# Patient Record
Sex: Female | Born: 1935 | Race: White | Hispanic: No | Smoking: Never smoker
Health system: Southern US, Community
[De-identification: ages and names within clinical notes are randomized; demographics above are authoritative.]

## PROBLEM LIST (undated history)

## (undated) DIAGNOSIS — S0990XA Unspecified injury of head, initial encounter: Secondary | ICD-10-CM

## (undated) DIAGNOSIS — N390 Urinary tract infection, site not specified: Secondary | ICD-10-CM

## (undated) DIAGNOSIS — I82409 Acute embolism and thrombosis of unspecified deep veins of unspecified lower extremity: Secondary | ICD-10-CM

## (undated) DIAGNOSIS — K219 Gastro-esophageal reflux disease without esophagitis: Secondary | ICD-10-CM

## (undated) DIAGNOSIS — I951 Orthostatic hypotension: Secondary | ICD-10-CM

## (undated) DIAGNOSIS — I119 Hypertensive heart disease without heart failure: Secondary | ICD-10-CM

## (undated) DIAGNOSIS — M14679 Charcot's joint, unspecified ankle and foot: Secondary | ICD-10-CM

## (undated) DIAGNOSIS — M869 Osteomyelitis, unspecified: Secondary | ICD-10-CM

## (undated) DIAGNOSIS — I5032 Chronic diastolic (congestive) heart failure: Secondary | ICD-10-CM

## (undated) DIAGNOSIS — R7611 Nonspecific reaction to tuberculin skin test without active tuberculosis: Secondary | ICD-10-CM

## (undated) DIAGNOSIS — K589 Irritable bowel syndrome without diarrhea: Secondary | ICD-10-CM

## (undated) DIAGNOSIS — G629 Polyneuropathy, unspecified: Secondary | ICD-10-CM

## (undated) DIAGNOSIS — S91302A Unspecified open wound, left foot, initial encounter: Secondary | ICD-10-CM

## (undated) DIAGNOSIS — K449 Diaphragmatic hernia without obstruction or gangrene: Secondary | ICD-10-CM

## (undated) DIAGNOSIS — Z9889 Other specified postprocedural states: Secondary | ICD-10-CM

## (undated) DIAGNOSIS — R0600 Dyspnea, unspecified: Secondary | ICD-10-CM

## (undated) DIAGNOSIS — I2510 Atherosclerotic heart disease of native coronary artery without angina pectoris: Secondary | ICD-10-CM

## (undated) DIAGNOSIS — I872 Venous insufficiency (chronic) (peripheral): Secondary | ICD-10-CM

## (undated) DIAGNOSIS — K5790 Diverticulosis of intestine, part unspecified, without perforation or abscess without bleeding: Secondary | ICD-10-CM

## (undated) DIAGNOSIS — R911 Solitary pulmonary nodule: Secondary | ICD-10-CM

## (undated) DIAGNOSIS — D126 Benign neoplasm of colon, unspecified: Secondary | ICD-10-CM

## (undated) DIAGNOSIS — J189 Pneumonia, unspecified organism: Secondary | ICD-10-CM

## (undated) DIAGNOSIS — R112 Nausea with vomiting, unspecified: Secondary | ICD-10-CM

## (undated) DIAGNOSIS — H903 Sensorineural hearing loss, bilateral: Secondary | ICD-10-CM

## (undated) DIAGNOSIS — M1990 Unspecified osteoarthritis, unspecified site: Secondary | ICD-10-CM

## (undated) DIAGNOSIS — M4800 Spinal stenosis, site unspecified: Secondary | ICD-10-CM

## (undated) DIAGNOSIS — F0390 Unspecified dementia without behavioral disturbance: Secondary | ICD-10-CM

## (undated) DIAGNOSIS — D099 Carcinoma in situ, unspecified: Secondary | ICD-10-CM

## (undated) DIAGNOSIS — H3530 Unspecified macular degeneration: Secondary | ICD-10-CM

## (undated) DIAGNOSIS — I2699 Other pulmonary embolism without acute cor pulmonale: Secondary | ICD-10-CM

## (undated) DIAGNOSIS — R931 Abnormal findings on diagnostic imaging of heart and coronary circulation: Secondary | ICD-10-CM

## (undated) DIAGNOSIS — G9050 Complex regional pain syndrome I, unspecified: Secondary | ICD-10-CM

## (undated) DIAGNOSIS — K760 Fatty (change of) liver, not elsewhere classified: Secondary | ICD-10-CM

## (undated) DIAGNOSIS — E785 Hyperlipidemia, unspecified: Secondary | ICD-10-CM

## (undated) DIAGNOSIS — E119 Type 2 diabetes mellitus without complications: Secondary | ICD-10-CM

## (undated) DIAGNOSIS — C801 Malignant (primary) neoplasm, unspecified: Secondary | ICD-10-CM

## (undated) DIAGNOSIS — I219 Acute myocardial infarction, unspecified: Secondary | ICD-10-CM

## (undated) DIAGNOSIS — R51 Headache: Secondary | ICD-10-CM

## (undated) HISTORY — DX: Chronic diastolic (congestive) heart failure: I50.32

## (undated) HISTORY — DX: Dyspnea, unspecified: R06.00

## (undated) HISTORY — DX: Sensorineural hearing loss, bilateral: H90.3

## (undated) HISTORY — DX: Unspecified open wound, left foot, initial encounter: S91.302A

## (undated) HISTORY — DX: Complex regional pain syndrome I, unspecified: G90.50

## (undated) HISTORY — DX: Nonspecific reaction to tuberculin skin test without active tuberculosis: R76.11

## (undated) HISTORY — DX: Charcot's joint, unspecified ankle and foot: M14.679

## (undated) HISTORY — DX: Other pulmonary embolism without acute cor pulmonale: I26.99

## (undated) HISTORY — DX: Diverticulosis of intestine, part unspecified, without perforation or abscess without bleeding: K57.90

## (undated) HISTORY — DX: Pneumonia, unspecified organism: J18.9

## (undated) HISTORY — DX: Irritable bowel syndrome, unspecified: K58.9

## (undated) HISTORY — DX: Fatty (change of) liver, not elsewhere classified: K76.0

## (undated) HISTORY — DX: Diaphragmatic hernia without obstruction or gangrene: K44.9

## (undated) HISTORY — DX: Hypertensive heart disease without heart failure: I11.9

## (undated) HISTORY — DX: Spinal stenosis, site unspecified: M48.00

## (undated) HISTORY — DX: Hyperlipidemia, unspecified: E78.5

## (undated) HISTORY — DX: Benign neoplasm of colon, unspecified: D12.6

## (undated) HISTORY — DX: Acute embolism and thrombosis of unspecified deep veins of unspecified lower extremity: I82.409

## (undated) HISTORY — DX: Unspecified osteoarthritis, unspecified site: M19.90

## (undated) HISTORY — DX: Solitary pulmonary nodule: R91.1

## (undated) HISTORY — DX: Unspecified macular degeneration: H35.30

---

## 1963-09-23 HISTORY — PX: NASAL SEPTUM SURGERY: SHX37

## 1966-03-23 HISTORY — PX: VEIN LIGATION: SHX2652

## 1975-03-24 HISTORY — PX: VAGINAL HYSTERECTOMY: SUR661

## 1992-10-23 DIAGNOSIS — D099 Carcinoma in situ, unspecified: Secondary | ICD-10-CM

## 1992-10-23 HISTORY — DX: Carcinoma in situ, unspecified: D09.9

## 1999-08-29 ENCOUNTER — Other Ambulatory Visit: Admission: RE | Admit: 1999-08-29 | Discharge: 1999-08-29 | Payer: Self-pay | Admitting: *Deleted

## 2001-01-11 ENCOUNTER — Other Ambulatory Visit: Admission: RE | Admit: 2001-01-11 | Discharge: 2001-01-11 | Payer: Self-pay | Admitting: Internal Medicine

## 2001-01-11 ENCOUNTER — Encounter (INDEPENDENT_AMBULATORY_CARE_PROVIDER_SITE_OTHER): Payer: Self-pay | Admitting: Specialist

## 2001-01-15 ENCOUNTER — Ambulatory Visit (HOSPITAL_COMMUNITY): Admission: RE | Admit: 2001-01-15 | Discharge: 2001-01-15 | Payer: Self-pay | Admitting: Internal Medicine

## 2001-01-15 ENCOUNTER — Encounter: Payer: Self-pay | Admitting: Internal Medicine

## 2002-03-03 ENCOUNTER — Other Ambulatory Visit: Admission: RE | Admit: 2002-03-03 | Discharge: 2002-03-03 | Payer: Self-pay | Admitting: *Deleted

## 2002-03-23 HISTORY — PX: CHOLECYSTECTOMY: SHX55

## 2002-03-28 ENCOUNTER — Encounter: Admission: RE | Admit: 2002-03-28 | Discharge: 2002-03-28 | Payer: Self-pay | Admitting: *Deleted

## 2002-04-10 ENCOUNTER — Encounter (INDEPENDENT_AMBULATORY_CARE_PROVIDER_SITE_OTHER): Payer: Self-pay | Admitting: Specialist

## 2002-04-10 ENCOUNTER — Encounter (INDEPENDENT_AMBULATORY_CARE_PROVIDER_SITE_OTHER): Payer: Self-pay | Admitting: *Deleted

## 2002-04-11 ENCOUNTER — Encounter: Payer: Self-pay | Admitting: Emergency Medicine

## 2002-04-11 ENCOUNTER — Inpatient Hospital Stay (HOSPITAL_COMMUNITY): Admission: EM | Admit: 2002-04-11 | Discharge: 2002-04-18 | Payer: Self-pay | Admitting: Emergency Medicine

## 2002-04-15 ENCOUNTER — Encounter: Payer: Self-pay | Admitting: Gastroenterology

## 2002-04-16 ENCOUNTER — Encounter: Payer: Self-pay | Admitting: General Surgery

## 2002-04-17 ENCOUNTER — Encounter: Payer: Self-pay | Admitting: Gastroenterology

## 2002-09-15 ENCOUNTER — Encounter: Admission: RE | Admit: 2002-09-15 | Discharge: 2002-09-15 | Payer: Self-pay | Admitting: *Deleted

## 2002-10-23 HISTORY — PX: CATARACT EXTRACTION W/ INTRAOCULAR LENS  IMPLANT, BILATERAL: SHX1307

## 2003-06-15 ENCOUNTER — Ambulatory Visit (HOSPITAL_COMMUNITY): Admission: RE | Admit: 2003-06-15 | Discharge: 2003-06-15 | Payer: Self-pay | Admitting: Specialist

## 2003-06-16 ENCOUNTER — Emergency Department (HOSPITAL_COMMUNITY): Admission: EM | Admit: 2003-06-16 | Discharge: 2003-06-17 | Payer: Self-pay | Admitting: Emergency Medicine

## 2003-06-17 ENCOUNTER — Encounter: Payer: Self-pay | Admitting: Emergency Medicine

## 2003-06-24 HISTORY — PX: CYST REMOVAL HAND: SHX6279

## 2003-09-15 ENCOUNTER — Encounter: Admission: RE | Admit: 2003-09-15 | Discharge: 2003-09-15 | Payer: Self-pay

## 2003-11-08 ENCOUNTER — Emergency Department (HOSPITAL_COMMUNITY): Admission: AD | Admit: 2003-11-08 | Discharge: 2003-11-08 | Payer: Self-pay | Admitting: Internal Medicine

## 2004-03-30 ENCOUNTER — Ambulatory Visit (HOSPITAL_COMMUNITY): Admission: RE | Admit: 2004-03-30 | Discharge: 2004-03-30 | Payer: Self-pay | Admitting: Internal Medicine

## 2004-05-11 ENCOUNTER — Encounter: Admission: RE | Admit: 2004-05-11 | Discharge: 2004-05-11 | Payer: Self-pay | Admitting: Otolaryngology

## 2004-08-29 ENCOUNTER — Ambulatory Visit: Payer: Self-pay | Admitting: Internal Medicine

## 2004-09-05 ENCOUNTER — Ambulatory Visit: Payer: Self-pay | Admitting: Internal Medicine

## 2004-11-23 HISTORY — PX: ROTATOR CUFF REPAIR: SHX139

## 2004-12-01 ENCOUNTER — Ambulatory Visit (HOSPITAL_BASED_OUTPATIENT_CLINIC_OR_DEPARTMENT_OTHER): Admission: RE | Admit: 2004-12-01 | Discharge: 2004-12-01 | Payer: Self-pay | Admitting: Orthopedic Surgery

## 2004-12-01 ENCOUNTER — Ambulatory Visit (HOSPITAL_COMMUNITY): Admission: RE | Admit: 2004-12-01 | Discharge: 2004-12-01 | Payer: Self-pay | Admitting: Orthopedic Surgery

## 2005-02-13 ENCOUNTER — Ambulatory Visit: Payer: Self-pay | Admitting: Internal Medicine

## 2005-02-24 ENCOUNTER — Ambulatory Visit: Payer: Self-pay | Admitting: Internal Medicine

## 2005-03-06 ENCOUNTER — Ambulatory Visit: Payer: Self-pay | Admitting: Internal Medicine

## 2005-06-06 ENCOUNTER — Encounter: Payer: Self-pay | Admitting: Internal Medicine

## 2005-08-07 ENCOUNTER — Ambulatory Visit: Payer: Self-pay | Admitting: Internal Medicine

## 2005-08-29 ENCOUNTER — Ambulatory Visit: Payer: Self-pay | Admitting: Internal Medicine

## 2006-01-12 ENCOUNTER — Ambulatory Visit: Payer: Self-pay | Admitting: Internal Medicine

## 2006-02-01 ENCOUNTER — Ambulatory Visit: Payer: Self-pay | Admitting: Cardiology

## 2006-02-01 ENCOUNTER — Encounter: Payer: Self-pay | Admitting: Internal Medicine

## 2006-02-12 ENCOUNTER — Ambulatory Visit: Payer: Self-pay | Admitting: Internal Medicine

## 2006-02-21 ENCOUNTER — Ambulatory Visit: Payer: Self-pay

## 2006-02-21 ENCOUNTER — Encounter: Payer: Self-pay | Admitting: Internal Medicine

## 2006-02-26 ENCOUNTER — Encounter (INDEPENDENT_AMBULATORY_CARE_PROVIDER_SITE_OTHER): Payer: Self-pay | Admitting: Specialist

## 2006-02-26 ENCOUNTER — Ambulatory Visit: Payer: Self-pay | Admitting: Internal Medicine

## 2006-02-26 ENCOUNTER — Encounter: Payer: Self-pay | Admitting: Internal Medicine

## 2006-03-08 ENCOUNTER — Encounter: Payer: Self-pay | Admitting: Internal Medicine

## 2006-03-08 ENCOUNTER — Encounter (INDEPENDENT_AMBULATORY_CARE_PROVIDER_SITE_OTHER): Payer: Self-pay | Admitting: Specialist

## 2006-03-08 ENCOUNTER — Ambulatory Visit: Payer: Self-pay | Admitting: Internal Medicine

## 2006-03-09 ENCOUNTER — Ambulatory Visit: Payer: Self-pay | Admitting: Cardiology

## 2006-03-20 ENCOUNTER — Encounter: Payer: Self-pay | Admitting: Internal Medicine

## 2006-03-20 ENCOUNTER — Ambulatory Visit: Payer: Self-pay | Admitting: Cardiology

## 2006-07-11 ENCOUNTER — Ambulatory Visit: Payer: Self-pay | Admitting: Internal Medicine

## 2007-04-05 ENCOUNTER — Encounter (INDEPENDENT_AMBULATORY_CARE_PROVIDER_SITE_OTHER): Payer: Self-pay | Admitting: *Deleted

## 2007-04-15 DIAGNOSIS — K219 Gastro-esophageal reflux disease without esophagitis: Secondary | ICD-10-CM | POA: Insufficient documentation

## 2007-04-15 DIAGNOSIS — I10 Essential (primary) hypertension: Secondary | ICD-10-CM | POA: Insufficient documentation

## 2007-05-09 ENCOUNTER — Ambulatory Visit: Payer: Self-pay | Admitting: Internal Medicine

## 2007-05-09 DIAGNOSIS — G9050 Complex regional pain syndrome I, unspecified: Secondary | ICD-10-CM | POA: Insufficient documentation

## 2007-05-09 DIAGNOSIS — E1142 Type 2 diabetes mellitus with diabetic polyneuropathy: Secondary | ICD-10-CM | POA: Insufficient documentation

## 2007-05-13 ENCOUNTER — Encounter (INDEPENDENT_AMBULATORY_CARE_PROVIDER_SITE_OTHER): Payer: Self-pay | Admitting: *Deleted

## 2007-05-13 LAB — CONVERTED CEMR LAB
ALT: 25 units/L (ref 0–35)
AST: 27 units/L (ref 0–37)
BUN: 21 mg/dL (ref 6–23)
CO2: 27 meq/L (ref 19–32)
Calcium: 9.3 mg/dL (ref 8.4–10.5)
Chloride: 103 meq/L (ref 96–112)
Cholesterol: 216 mg/dL (ref 0–200)
Creatinine, Ser: 1 mg/dL (ref 0.4–1.2)
Creatinine,U: 89.1 mg/dL
Direct LDL: 124.3 mg/dL
GFR calc Af Amer: 71 mL/min
GFR calc non Af Amer: 58 mL/min
Glucose, Bld: 149 mg/dL — ABNORMAL HIGH (ref 70–99)
HDL: 52 mg/dL (ref >39.0)
Hemoglobin: 14.3 g/dL (ref 12.0–15.0)
Hgb A1c MFr Bld: 6.9 % — ABNORMAL HIGH (ref 4.6–6.0)
Microalb Creat Ratio: 2.2 mg/g (ref 0.0–30.0)
Microalb, Ur: 0.2 mg/dL (ref 0.0–1.9)
Potassium: 4.4 meq/L (ref 3.5–5.1)
Sodium: 139 meq/L (ref 135–145)
TSH: 2.63 microintl units/mL (ref 0.35–5.50)
Total CHOL/HDL Ratio: 4.2
Triglycerides: 284 mg/dL (ref 0–149)
VLDL: 57 mg/dL — ABNORMAL HIGH (ref 0–40)

## 2007-05-17 ENCOUNTER — Encounter (INDEPENDENT_AMBULATORY_CARE_PROVIDER_SITE_OTHER): Payer: Self-pay | Admitting: *Deleted

## 2007-05-31 ENCOUNTER — Encounter: Payer: Self-pay | Admitting: Internal Medicine

## 2007-06-07 ENCOUNTER — Ambulatory Visit: Payer: Self-pay | Admitting: Internal Medicine

## 2007-06-13 ENCOUNTER — Ambulatory Visit: Payer: Self-pay | Admitting: Internal Medicine

## 2007-06-13 ENCOUNTER — Encounter: Payer: Self-pay | Admitting: Internal Medicine

## 2007-06-16 ENCOUNTER — Emergency Department (HOSPITAL_COMMUNITY): Admission: EM | Admit: 2007-06-16 | Discharge: 2007-06-16 | Payer: Self-pay | Admitting: Emergency Medicine

## 2007-06-17 ENCOUNTER — Telehealth (INDEPENDENT_AMBULATORY_CARE_PROVIDER_SITE_OTHER): Payer: Self-pay | Admitting: *Deleted

## 2007-06-24 HISTORY — PX: WISDOM TOOTH EXTRACTION: SHX21

## 2007-06-26 ENCOUNTER — Ambulatory Visit: Payer: Self-pay | Admitting: Cardiology

## 2007-06-27 ENCOUNTER — Telehealth (INDEPENDENT_AMBULATORY_CARE_PROVIDER_SITE_OTHER): Payer: Self-pay | Admitting: *Deleted

## 2007-07-01 ENCOUNTER — Ambulatory Visit: Payer: Self-pay

## 2007-07-24 HISTORY — PX: ROTATOR CUFF REPAIR: SHX139

## 2007-07-30 ENCOUNTER — Encounter: Payer: Self-pay | Admitting: Internal Medicine

## 2007-07-30 ENCOUNTER — Encounter: Admission: RE | Admit: 2007-07-30 | Discharge: 2007-07-30 | Payer: Self-pay | Admitting: Orthopedic Surgery

## 2007-07-30 ENCOUNTER — Telehealth (INDEPENDENT_AMBULATORY_CARE_PROVIDER_SITE_OTHER): Payer: Self-pay | Admitting: *Deleted

## 2007-07-31 ENCOUNTER — Ambulatory Visit (HOSPITAL_BASED_OUTPATIENT_CLINIC_OR_DEPARTMENT_OTHER): Admission: RE | Admit: 2007-07-31 | Discharge: 2007-08-01 | Payer: Self-pay | Admitting: Orthopedic Surgery

## 2007-08-09 ENCOUNTER — Telehealth (INDEPENDENT_AMBULATORY_CARE_PROVIDER_SITE_OTHER): Payer: Self-pay | Admitting: *Deleted

## 2007-08-15 ENCOUNTER — Encounter: Payer: Self-pay | Admitting: Internal Medicine

## 2007-10-24 DIAGNOSIS — Z9889 Other specified postprocedural states: Secondary | ICD-10-CM

## 2007-10-24 DIAGNOSIS — R112 Nausea with vomiting, unspecified: Secondary | ICD-10-CM

## 2007-10-24 HISTORY — DX: Other specified postprocedural states: Z98.890

## 2007-10-24 HISTORY — DX: Other specified postprocedural states: R11.2

## 2007-10-25 ENCOUNTER — Telehealth (INDEPENDENT_AMBULATORY_CARE_PROVIDER_SITE_OTHER): Payer: Self-pay | Admitting: *Deleted

## 2007-11-01 ENCOUNTER — Encounter: Admission: RE | Admit: 2007-11-01 | Discharge: 2007-11-01 | Payer: Self-pay | Admitting: Internal Medicine

## 2007-12-18 ENCOUNTER — Inpatient Hospital Stay (HOSPITAL_COMMUNITY): Admission: AD | Admit: 2007-12-18 | Discharge: 2007-12-20 | Payer: Self-pay | Admitting: Orthopaedic Surgery

## 2007-12-18 HISTORY — PX: ANTERIOR CERVICAL DECOMP/DISCECTOMY FUSION: SHX1161

## 2007-12-22 ENCOUNTER — Encounter: Payer: Self-pay | Admitting: Internal Medicine

## 2007-12-22 ENCOUNTER — Ambulatory Visit: Payer: Self-pay | Admitting: Vascular Surgery

## 2007-12-22 ENCOUNTER — Ambulatory Visit: Payer: Self-pay | Admitting: Cardiology

## 2007-12-22 ENCOUNTER — Inpatient Hospital Stay (HOSPITAL_COMMUNITY): Admission: EM | Admit: 2007-12-22 | Discharge: 2007-12-26 | Payer: Self-pay | Admitting: Emergency Medicine

## 2007-12-22 DIAGNOSIS — I2699 Other pulmonary embolism without acute cor pulmonale: Secondary | ICD-10-CM | POA: Insufficient documentation

## 2007-12-22 HISTORY — DX: Other pulmonary embolism without acute cor pulmonale: I26.99

## 2007-12-24 ENCOUNTER — Encounter: Payer: Self-pay | Admitting: Internal Medicine

## 2007-12-25 ENCOUNTER — Ambulatory Visit: Payer: Self-pay | Admitting: Internal Medicine

## 2007-12-30 ENCOUNTER — Ambulatory Visit: Payer: Self-pay | Admitting: Internal Medicine

## 2007-12-30 DIAGNOSIS — I2699 Other pulmonary embolism without acute cor pulmonale: Secondary | ICD-10-CM | POA: Insufficient documentation

## 2007-12-30 DIAGNOSIS — E785 Hyperlipidemia, unspecified: Secondary | ICD-10-CM | POA: Insufficient documentation

## 2007-12-30 DIAGNOSIS — M810 Age-related osteoporosis without current pathological fracture: Secondary | ICD-10-CM | POA: Insufficient documentation

## 2007-12-30 LAB — CONVERTED CEMR LAB
Blood Glucose, Fasting: 152 mg/dL
INR: 2.4

## 2008-01-01 ENCOUNTER — Encounter: Payer: Self-pay | Admitting: Internal Medicine

## 2008-01-02 ENCOUNTER — Ambulatory Visit: Payer: Self-pay | Admitting: Internal Medicine

## 2008-01-02 LAB — CONVERTED CEMR LAB: INR: 2.2

## 2008-01-06 ENCOUNTER — Encounter (INDEPENDENT_AMBULATORY_CARE_PROVIDER_SITE_OTHER): Payer: Self-pay | Admitting: *Deleted

## 2008-01-10 ENCOUNTER — Ambulatory Visit: Payer: Self-pay | Admitting: Internal Medicine

## 2008-01-10 LAB — CONVERTED CEMR LAB: INR: 1.7

## 2008-01-13 ENCOUNTER — Ambulatory Visit: Payer: Self-pay | Admitting: Pulmonary Disease

## 2008-01-20 ENCOUNTER — Telehealth (INDEPENDENT_AMBULATORY_CARE_PROVIDER_SITE_OTHER): Payer: Self-pay | Admitting: *Deleted

## 2008-01-20 ENCOUNTER — Ambulatory Visit: Payer: Self-pay | Admitting: Cardiology

## 2008-01-20 ENCOUNTER — Ambulatory Visit: Payer: Self-pay | Admitting: Internal Medicine

## 2008-01-21 ENCOUNTER — Telehealth (INDEPENDENT_AMBULATORY_CARE_PROVIDER_SITE_OTHER): Payer: Self-pay | Admitting: *Deleted

## 2008-01-22 DIAGNOSIS — R911 Solitary pulmonary nodule: Secondary | ICD-10-CM | POA: Insufficient documentation

## 2008-01-24 ENCOUNTER — Ambulatory Visit (HOSPITAL_COMMUNITY): Admission: RE | Admit: 2008-01-24 | Discharge: 2008-01-24 | Payer: Self-pay | Admitting: Pulmonary Disease

## 2008-01-27 DIAGNOSIS — D126 Benign neoplasm of colon, unspecified: Secondary | ICD-10-CM | POA: Insufficient documentation

## 2008-01-27 DIAGNOSIS — K5730 Diverticulosis of large intestine without perforation or abscess without bleeding: Secondary | ICD-10-CM | POA: Insufficient documentation

## 2008-01-27 DIAGNOSIS — Z8719 Personal history of other diseases of the digestive system: Secondary | ICD-10-CM | POA: Insufficient documentation

## 2008-02-03 ENCOUNTER — Ambulatory Visit: Payer: Self-pay | Admitting: Internal Medicine

## 2008-02-03 LAB — CONVERTED CEMR LAB: INR: 2.5

## 2008-02-05 ENCOUNTER — Telehealth: Payer: Self-pay | Admitting: Pulmonary Disease

## 2008-02-12 ENCOUNTER — Telehealth (INDEPENDENT_AMBULATORY_CARE_PROVIDER_SITE_OTHER): Payer: Self-pay | Admitting: *Deleted

## 2008-03-03 ENCOUNTER — Ambulatory Visit: Payer: Self-pay | Admitting: Internal Medicine

## 2008-03-03 LAB — CONVERTED CEMR LAB
INR: 1.9
Prothrombin Time: 16.8 s

## 2008-03-24 ENCOUNTER — Ambulatory Visit: Payer: Self-pay | Admitting: Internal Medicine

## 2008-03-24 LAB — CONVERTED CEMR LAB
INR: 1.5
Prothrombin Time: 14.9 s

## 2008-04-06 ENCOUNTER — Ambulatory Visit: Payer: Self-pay | Admitting: Internal Medicine

## 2008-04-06 LAB — CONVERTED CEMR LAB
INR: 2.1
Prothrombin Time: 17.8 s

## 2008-04-27 ENCOUNTER — Ambulatory Visit: Payer: Self-pay | Admitting: Internal Medicine

## 2008-04-27 DIAGNOSIS — R609 Edema, unspecified: Secondary | ICD-10-CM | POA: Insufficient documentation

## 2008-04-27 LAB — CONVERTED CEMR LAB
INR: 2
Prothrombin Time: 17.5 s

## 2008-05-07 ENCOUNTER — Encounter: Admission: RE | Admit: 2008-05-07 | Discharge: 2008-05-07 | Payer: Self-pay | Admitting: Orthopaedic Surgery

## 2008-05-07 ENCOUNTER — Encounter: Payer: Self-pay | Admitting: Internal Medicine

## 2008-05-19 ENCOUNTER — Encounter: Payer: Self-pay | Admitting: Internal Medicine

## 2008-05-25 ENCOUNTER — Ambulatory Visit: Payer: Self-pay | Admitting: Internal Medicine

## 2008-05-25 LAB — CONVERTED CEMR LAB
INR: 2.2
Prothrombin Time: 18.1 s

## 2008-06-02 ENCOUNTER — Encounter: Payer: Self-pay | Admitting: Internal Medicine

## 2008-06-03 ENCOUNTER — Ambulatory Visit: Payer: Self-pay | Admitting: Internal Medicine

## 2008-06-03 DIAGNOSIS — M1990 Unspecified osteoarthritis, unspecified site: Secondary | ICD-10-CM | POA: Insufficient documentation

## 2008-06-08 LAB — CONVERTED CEMR LAB
ALT: 16 units/L (ref 0–35)
AST: 19 units/L (ref 0–37)
BUN: 23 mg/dL (ref 6–23)
CO2: 27 meq/L (ref 19–32)
Calcium: 9.3 mg/dL (ref 8.4–10.5)
Chloride: 103 meq/L (ref 96–112)
Cholesterol: 224 mg/dL (ref 0–200)
Creatinine, Ser: 1 mg/dL (ref 0.4–1.2)
Direct LDL: 118.1 mg/dL
GFR calc Af Amer: 70 mL/min
GFR calc non Af Amer: 58 mL/min
Glucose, Bld: 124 mg/dL — ABNORMAL HIGH (ref 70–99)
HDL: 50.5 mg/dL (ref >39.0)
Hemoglobin: 13.3 g/dL (ref 12.0–15.0)
Potassium: 4.4 meq/L (ref 3.5–5.1)
Sodium: 140 meq/L (ref 135–145)
Total CHOL/HDL Ratio: 4.4
Triglycerides: 266 mg/dL (ref 0–149)
VLDL: 53 mg/dL — ABNORMAL HIGH (ref 0–40)

## 2008-06-23 ENCOUNTER — Encounter: Payer: Self-pay | Admitting: Internal Medicine

## 2008-06-25 ENCOUNTER — Ambulatory Visit: Payer: Self-pay | Admitting: Internal Medicine

## 2008-06-25 LAB — CONVERTED CEMR LAB
INR: 3.7
Prothrombin Time: 23.2 s

## 2008-07-07 ENCOUNTER — Ambulatory Visit: Payer: Self-pay | Admitting: Internal Medicine

## 2008-07-07 LAB — CONVERTED CEMR LAB
Bilirubin Urine: NEGATIVE
Glucose, Urine, Semiquant: NEGATIVE
INR: 2.6
Ketones, urine, test strip: NEGATIVE
Nitrite: NEGATIVE
Protein, U semiquant: NEGATIVE
Prothrombin Time: 19.5 s
Specific Gravity, Urine: 1.01
Urobilinogen, UA: 0.2
pH: 7

## 2008-07-08 ENCOUNTER — Encounter: Payer: Self-pay | Admitting: Internal Medicine

## 2008-07-08 LAB — CONVERTED CEMR LAB

## 2008-07-09 ENCOUNTER — Encounter: Payer: Self-pay | Admitting: Internal Medicine

## 2008-07-14 ENCOUNTER — Observation Stay (HOSPITAL_COMMUNITY): Admission: AD | Admit: 2008-07-14 | Discharge: 2008-07-16 | Payer: Self-pay | Admitting: Internal Medicine

## 2008-07-14 ENCOUNTER — Ambulatory Visit: Payer: Self-pay | Admitting: Cardiology

## 2008-07-14 ENCOUNTER — Ambulatory Visit: Payer: Self-pay | Admitting: Internal Medicine

## 2008-07-15 ENCOUNTER — Encounter: Payer: Self-pay | Admitting: Internal Medicine

## 2008-07-21 ENCOUNTER — Ambulatory Visit: Payer: Self-pay | Admitting: Internal Medicine

## 2008-07-21 LAB — CONVERTED CEMR LAB
BUN: 22 mg/dL (ref 6–23)
Creatinine, Ser: 1 mg/dL (ref 0.4–1.2)

## 2008-07-22 ENCOUNTER — Telehealth: Payer: Self-pay | Admitting: Internal Medicine

## 2008-07-24 ENCOUNTER — Ambulatory Visit: Payer: Self-pay | Admitting: Cardiology

## 2008-07-27 ENCOUNTER — Ambulatory Visit: Payer: Self-pay | Admitting: Internal Medicine

## 2008-07-27 LAB — CONVERTED CEMR LAB
Bilirubin Urine: NEGATIVE
Blood in Urine, dipstick: NEGATIVE
Glucose, Urine, Semiquant: NEGATIVE
INR: 1.5
Ketones, urine, test strip: NEGATIVE
Nitrite: NEGATIVE
Protein, U semiquant: NEGATIVE
Prothrombin Time: 15.2 s
Specific Gravity, Urine: 1.01
Urobilinogen, UA: 0.2
WBC Urine, dipstick: NEGATIVE
pH: 7

## 2008-07-28 ENCOUNTER — Telehealth: Payer: Self-pay | Admitting: Internal Medicine

## 2008-08-03 LAB — CONVERTED CEMR LAB: Hgb A1c MFr Bld: 6.3 % — ABNORMAL HIGH (ref 4.6–6.0)

## 2008-08-04 ENCOUNTER — Telehealth: Payer: Self-pay | Admitting: Internal Medicine

## 2008-08-04 ENCOUNTER — Encounter: Payer: Self-pay | Admitting: Internal Medicine

## 2008-08-04 LAB — CONVERTED CEMR LAB: INR: 1.9

## 2008-08-05 ENCOUNTER — Encounter (INDEPENDENT_AMBULATORY_CARE_PROVIDER_SITE_OTHER): Payer: Self-pay | Admitting: Orthopedic Surgery

## 2008-08-05 ENCOUNTER — Inpatient Hospital Stay (HOSPITAL_COMMUNITY): Admission: AD | Admit: 2008-08-05 | Discharge: 2008-08-06 | Payer: Self-pay | Admitting: Orthopedic Surgery

## 2008-08-05 ENCOUNTER — Encounter: Payer: Self-pay | Admitting: Internal Medicine

## 2008-08-12 ENCOUNTER — Telehealth: Payer: Self-pay | Admitting: Internal Medicine

## 2008-08-18 ENCOUNTER — Encounter: Payer: Self-pay | Admitting: Internal Medicine

## 2008-08-20 ENCOUNTER — Ambulatory Visit: Payer: Self-pay | Admitting: Internal Medicine

## 2008-08-20 ENCOUNTER — Ambulatory Visit: Payer: Self-pay | Admitting: Cardiovascular Disease

## 2008-08-20 LAB — CONVERTED CEMR LAB
Blood Glucose, Fingerstick: 166
Hemoglobin: 14.6 g/dL
INR: 2.3
Prothrombin Time: 18.4 s

## 2008-08-21 ENCOUNTER — Telehealth (INDEPENDENT_AMBULATORY_CARE_PROVIDER_SITE_OTHER): Payer: Self-pay | Admitting: *Deleted

## 2008-08-23 HISTORY — PX: TOE AMPUTATION: SHX809

## 2008-08-25 ENCOUNTER — Telehealth: Payer: Self-pay | Admitting: Internal Medicine

## 2008-08-31 ENCOUNTER — Ambulatory Visit: Payer: Self-pay | Admitting: Internal Medicine

## 2008-09-02 ENCOUNTER — Encounter (INDEPENDENT_AMBULATORY_CARE_PROVIDER_SITE_OTHER): Payer: Self-pay | Admitting: *Deleted

## 2008-09-09 ENCOUNTER — Ambulatory Visit: Payer: Self-pay | Admitting: Internal Medicine

## 2008-09-14 ENCOUNTER — Telehealth: Payer: Self-pay | Admitting: Internal Medicine

## 2008-09-16 ENCOUNTER — Encounter (INDEPENDENT_AMBULATORY_CARE_PROVIDER_SITE_OTHER): Payer: Self-pay | Admitting: *Deleted

## 2008-09-18 ENCOUNTER — Ambulatory Visit: Payer: Self-pay | Admitting: Internal Medicine

## 2008-09-18 LAB — CONVERTED CEMR LAB
INR: 4.5
Prothrombin Time: 25.8 s

## 2008-09-21 ENCOUNTER — Telehealth: Payer: Self-pay | Admitting: Internal Medicine

## 2008-09-29 ENCOUNTER — Ambulatory Visit: Payer: Self-pay | Admitting: Internal Medicine

## 2008-10-02 ENCOUNTER — Ambulatory Visit: Payer: Self-pay | Admitting: Family Medicine

## 2008-10-02 LAB — CONVERTED CEMR LAB
INR: 1.9
Prothrombin Time: 17.1 s

## 2008-10-06 ENCOUNTER — Telehealth (INDEPENDENT_AMBULATORY_CARE_PROVIDER_SITE_OTHER): Payer: Self-pay | Admitting: *Deleted

## 2008-10-13 ENCOUNTER — Ambulatory Visit: Payer: Self-pay | Admitting: Internal Medicine

## 2008-10-13 LAB — CONVERTED CEMR LAB
INR: 1.8
Prothrombin Time: 16.7 s

## 2008-10-15 ENCOUNTER — Telehealth (INDEPENDENT_AMBULATORY_CARE_PROVIDER_SITE_OTHER): Payer: Self-pay | Admitting: *Deleted

## 2008-10-27 ENCOUNTER — Ambulatory Visit: Payer: Self-pay | Admitting: Family Medicine

## 2008-10-27 LAB — CONVERTED CEMR LAB
INR: 1.7
Prothrombin Time: 16.2 s

## 2008-10-29 ENCOUNTER — Ambulatory Visit: Payer: Self-pay | Admitting: Internal Medicine

## 2008-11-03 ENCOUNTER — Ambulatory Visit: Payer: Self-pay | Admitting: Internal Medicine

## 2008-11-03 LAB — CONVERTED CEMR LAB
INR: 1.6
Prothrombin Time: 15.6 s

## 2008-11-11 ENCOUNTER — Telehealth (INDEPENDENT_AMBULATORY_CARE_PROVIDER_SITE_OTHER): Payer: Self-pay | Admitting: *Deleted

## 2008-12-07 ENCOUNTER — Telehealth (INDEPENDENT_AMBULATORY_CARE_PROVIDER_SITE_OTHER): Payer: Self-pay | Admitting: *Deleted

## 2008-12-14 ENCOUNTER — Telehealth (INDEPENDENT_AMBULATORY_CARE_PROVIDER_SITE_OTHER): Payer: Self-pay | Admitting: *Deleted

## 2009-01-20 ENCOUNTER — Ambulatory Visit: Payer: Self-pay | Admitting: Internal Medicine

## 2009-01-25 LAB — CONVERTED CEMR LAB
ALT: 17 units/L (ref 0–35)
AST: 23 units/L (ref 0–37)
BUN: 19 mg/dL (ref 6–23)
CO2: 27 meq/L (ref 19–32)
Calcium: 9.3 mg/dL (ref 8.4–10.5)
Chloride: 105 meq/L (ref 96–112)
Creatinine, Ser: 1 mg/dL (ref 0.4–1.2)
GFR calc non Af Amer: 57.99 mL/min (ref >60)
Glucose, Bld: 110 mg/dL — ABNORMAL HIGH (ref 70–99)
Hgb A1c MFr Bld: 6.4 % (ref 4.6–6.5)
Potassium: 4.2 meq/L (ref 3.5–5.1)
Sodium: 141 meq/L (ref 135–145)

## 2009-03-18 ENCOUNTER — Ambulatory Visit: Payer: Self-pay | Admitting: Pulmonary Disease

## 2009-03-18 DIAGNOSIS — R0602 Shortness of breath: Secondary | ICD-10-CM | POA: Insufficient documentation

## 2009-03-23 DIAGNOSIS — H3530 Unspecified macular degeneration: Secondary | ICD-10-CM

## 2009-03-23 HISTORY — DX: Unspecified macular degeneration: H35.30

## 2009-03-24 ENCOUNTER — Telehealth (INDEPENDENT_AMBULATORY_CARE_PROVIDER_SITE_OTHER): Payer: Self-pay | Admitting: *Deleted

## 2009-03-25 ENCOUNTER — Ambulatory Visit: Payer: Self-pay | Admitting: Internal Medicine

## 2009-03-25 LAB — CONVERTED CEMR LAB: Hemoglobin: 14.3 g/dL

## 2009-03-29 ENCOUNTER — Telehealth (INDEPENDENT_AMBULATORY_CARE_PROVIDER_SITE_OTHER): Payer: Self-pay | Admitting: *Deleted

## 2009-03-30 ENCOUNTER — Encounter (INDEPENDENT_AMBULATORY_CARE_PROVIDER_SITE_OTHER): Payer: Self-pay | Admitting: *Deleted

## 2009-03-30 ENCOUNTER — Ambulatory Visit: Payer: Self-pay

## 2009-03-30 ENCOUNTER — Encounter: Payer: Self-pay | Admitting: Internal Medicine

## 2009-03-30 LAB — CONVERTED CEMR LAB
BUN: 16 mg/dL (ref 6–23)
Basophils Absolute: 0 10*3/uL (ref 0.0–0.1)
Basophils Relative: 0.2 % (ref 0.0–3.0)
Creatinine, Ser: 0.9 mg/dL (ref 0.4–1.2)
Eosinophils Absolute: 0.3 10*3/uL (ref 0.0–0.7)
Eosinophils Relative: 5.4 % — ABNORMAL HIGH (ref 0.0–5.0)
HCT: 40.2 % (ref 36.0–46.0)
Hemoglobin: 13.8 g/dL (ref 12.0–15.0)
Lymphocytes Relative: 42 % (ref 12.0–46.0)
Lymphs Abs: 2.6 10*3/uL (ref 0.7–4.0)
MCHC: 34.4 g/dL (ref 30.0–36.0)
MCV: 88.2 fL (ref 78.0–100.0)
Monocytes Absolute: 0.5 10*3/uL (ref 0.1–1.0)
Monocytes Relative: 7.5 % (ref 3.0–12.0)
Neutro Abs: 2.7 10*3/uL (ref 1.4–7.7)
Neutrophils Relative %: 44.9 % (ref 43.0–77.0)
Platelets: 179 10*3/uL (ref 150.0–400.0)
Potassium: 4.7 meq/L (ref 3.5–5.1)
RBC: 4.56 M/uL (ref 3.87–5.11)
RDW: 14.6 % (ref 11.5–14.6)
TSH: 1.94 microintl units/mL (ref 0.35–5.50)
WBC: 6.1 10*3/uL (ref 4.5–10.5)

## 2009-04-02 ENCOUNTER — Telehealth: Payer: Self-pay | Admitting: Internal Medicine

## 2009-04-07 ENCOUNTER — Telehealth (INDEPENDENT_AMBULATORY_CARE_PROVIDER_SITE_OTHER): Payer: Self-pay | Admitting: *Deleted

## 2009-04-22 ENCOUNTER — Ambulatory Visit: Payer: Self-pay | Admitting: Internal Medicine

## 2009-04-22 ENCOUNTER — Encounter: Admission: RE | Admit: 2009-04-22 | Discharge: 2009-04-22 | Payer: Self-pay | Admitting: Internal Medicine

## 2009-04-22 LAB — CONVERTED CEMR LAB
Creatinine,U: 86.1 mg/dL
Microalb Creat Ratio: 16.3 mg/g (ref 0.0–30.0)
Microalb, Ur: 1.4 mg/dL (ref 0.0–1.9)

## 2009-04-27 LAB — CONVERTED CEMR LAB
Cholesterol: 204 mg/dL — ABNORMAL HIGH (ref 0–200)
Direct LDL: 123.8 mg/dL
HDL: 47.2 mg/dL (ref >39.00)
Hgb A1c MFr Bld: 6.2 % (ref 4.6–6.5)
Total CHOL/HDL Ratio: 4
Triglycerides: 221 mg/dL — ABNORMAL HIGH (ref 0.0–149.0)
VLDL: 44.2 mg/dL — ABNORMAL HIGH (ref 0.0–40.0)

## 2009-04-29 ENCOUNTER — Encounter: Payer: Self-pay | Admitting: Internal Medicine

## 2009-04-30 ENCOUNTER — Telehealth: Payer: Self-pay | Admitting: Internal Medicine

## 2009-05-14 ENCOUNTER — Telehealth (INDEPENDENT_AMBULATORY_CARE_PROVIDER_SITE_OTHER): Payer: Self-pay | Admitting: *Deleted

## 2009-05-18 ENCOUNTER — Ambulatory Visit: Payer: Self-pay | Admitting: Internal Medicine

## 2009-05-19 ENCOUNTER — Encounter: Payer: Self-pay | Admitting: Internal Medicine

## 2009-05-27 ENCOUNTER — Encounter: Payer: Self-pay | Admitting: Internal Medicine

## 2009-05-27 ENCOUNTER — Ambulatory Visit: Payer: Self-pay | Admitting: Internal Medicine

## 2009-05-27 LAB — HM COLONOSCOPY: HM Colonoscopy: NORMAL

## 2009-05-28 ENCOUNTER — Encounter: Payer: Self-pay | Admitting: Internal Medicine

## 2009-06-14 LAB — HM COLONOSCOPY

## 2009-06-14 LAB — HM DIABETES FOOT EXAM

## 2009-06-23 HISTORY — PX: FOOT BONE EXCISION: SUR493

## 2009-07-06 ENCOUNTER — Telehealth: Payer: Self-pay | Admitting: Internal Medicine

## 2009-07-26 ENCOUNTER — Ambulatory Visit: Payer: Self-pay | Admitting: Internal Medicine

## 2009-07-28 ENCOUNTER — Telehealth (INDEPENDENT_AMBULATORY_CARE_PROVIDER_SITE_OTHER): Payer: Self-pay | Admitting: *Deleted

## 2009-08-04 ENCOUNTER — Encounter: Payer: Self-pay | Admitting: Internal Medicine

## 2009-08-10 ENCOUNTER — Encounter: Admission: RE | Admit: 2009-08-10 | Discharge: 2009-09-20 | Payer: Self-pay | Admitting: Internal Medicine

## 2009-08-13 ENCOUNTER — Encounter: Payer: Self-pay | Admitting: Internal Medicine

## 2009-08-26 ENCOUNTER — Telehealth: Payer: Self-pay | Admitting: Internal Medicine

## 2009-08-27 ENCOUNTER — Ambulatory Visit: Payer: Self-pay | Admitting: Cardiovascular Disease

## 2009-08-30 ENCOUNTER — Ambulatory Visit: Payer: Self-pay | Admitting: Internal Medicine

## 2009-09-09 ENCOUNTER — Telehealth (INDEPENDENT_AMBULATORY_CARE_PROVIDER_SITE_OTHER): Payer: Self-pay | Admitting: *Deleted

## 2009-09-10 ENCOUNTER — Encounter: Payer: Self-pay | Admitting: Internal Medicine

## 2009-09-10 ENCOUNTER — Ambulatory Visit: Payer: Self-pay | Admitting: Family

## 2009-09-10 DIAGNOSIS — K140 Glossitis: Secondary | ICD-10-CM | POA: Insufficient documentation

## 2009-09-10 LAB — CONVERTED CEMR LAB
Folate: >20 ng/mL
Pro B Natriuretic peptide (BNP): 35 pg/mL (ref 0.0–100.0)
Vitamin B-12: >1500 pg/mL — ABNORMAL HIGH (ref 211–911)

## 2009-09-11 ENCOUNTER — Encounter: Payer: Self-pay | Admitting: Family

## 2009-09-11 LAB — CONVERTED CEMR LAB

## 2009-09-14 ENCOUNTER — Telehealth (INDEPENDENT_AMBULATORY_CARE_PROVIDER_SITE_OTHER): Payer: Self-pay | Admitting: *Deleted

## 2009-09-15 ENCOUNTER — Ambulatory Visit: Payer: Self-pay | Admitting: Internal Medicine

## 2009-09-16 ENCOUNTER — Encounter: Payer: Self-pay | Admitting: Internal Medicine

## 2009-09-16 LAB — CONVERTED CEMR LAB: RBC / HPF: NONE SEEN (ref <3)

## 2009-09-20 ENCOUNTER — Encounter: Payer: Self-pay | Admitting: Internal Medicine

## 2009-09-23 ENCOUNTER — Ambulatory Visit: Payer: Self-pay | Admitting: Pulmonary Disease

## 2009-09-23 ENCOUNTER — Ambulatory Visit: Payer: Self-pay | Admitting: Internal Medicine

## 2009-09-24 ENCOUNTER — Telehealth (INDEPENDENT_AMBULATORY_CARE_PROVIDER_SITE_OTHER): Payer: Self-pay | Admitting: *Deleted

## 2009-09-27 LAB — CONVERTED CEMR LAB
ALT: 20 units/L (ref 0–35)
AST: 21 units/L (ref 0–37)
BUN: 16 mg/dL (ref 6–23)
Basophils Absolute: 0 10*3/uL (ref 0.0–0.1)
Basophils Relative: 0.6 % (ref 0.0–3.0)
CO2: 27 meq/L (ref 19–32)
Calcium: 9.7 mg/dL (ref 8.4–10.5)
Chloride: 105 meq/L (ref 96–112)
Creatinine, Ser: 0.8 mg/dL (ref 0.4–1.2)
Eosinophils Absolute: 0.1 10*3/uL (ref 0.0–0.7)
Eosinophils Relative: 1.8 % (ref 0.0–5.0)
GFR calc non Af Amer: 74.88 mL/min (ref >60)
Glucose, Bld: 129 mg/dL — ABNORMAL HIGH (ref 70–99)
HCT: 41.6 % (ref 36.0–46.0)
Hemoglobin: 13.9 g/dL (ref 12.0–15.0)
Lymphocytes Relative: 38.9 % (ref 12.0–46.0)
Lymphs Abs: 2.5 10*3/uL (ref 0.7–4.0)
MCHC: 33.4 g/dL (ref 30.0–36.0)
MCV: 92.8 fL (ref 78.0–100.0)
Monocytes Absolute: 0.3 10*3/uL (ref 0.1–1.0)
Monocytes Relative: 5 % (ref 3.0–12.0)
Neutro Abs: 3.5 10*3/uL (ref 1.4–7.7)
Neutrophils Relative %: 53.7 % (ref 43.0–77.0)
Platelets: 196 10*3/uL (ref 150.0–400.0)
Potassium: 4.4 meq/L (ref 3.5–5.1)
RBC: 4.49 M/uL (ref 3.87–5.11)
RDW: 13.9 % (ref 11.5–14.6)
Sodium: 141 meq/L (ref 135–145)
WBC: 6.4 10*3/uL (ref 4.5–10.5)

## 2009-10-01 ENCOUNTER — Telehealth: Payer: Self-pay | Admitting: Pulmonary Disease

## 2009-10-01 ENCOUNTER — Ambulatory Visit (HOSPITAL_COMMUNITY): Admission: RE | Admit: 2009-10-01 | Discharge: 2009-10-01 | Payer: Self-pay | Admitting: Pulmonary Disease

## 2009-10-11 ENCOUNTER — Ambulatory Visit: Payer: Self-pay

## 2009-10-11 ENCOUNTER — Encounter: Payer: Self-pay | Admitting: Pulmonary Disease

## 2009-10-11 ENCOUNTER — Ambulatory Visit: Payer: Self-pay | Admitting: Internal Medicine

## 2009-10-11 ENCOUNTER — Ambulatory Visit (HOSPITAL_COMMUNITY): Admission: RE | Admit: 2009-10-11 | Discharge: 2009-10-11 | Payer: Self-pay | Admitting: Pulmonary Disease

## 2009-10-12 ENCOUNTER — Telehealth (INDEPENDENT_AMBULATORY_CARE_PROVIDER_SITE_OTHER): Payer: Self-pay | Admitting: *Deleted

## 2009-10-18 ENCOUNTER — Ambulatory Visit: Payer: Self-pay | Admitting: Internal Medicine

## 2009-10-18 LAB — CONVERTED CEMR LAB
BUN: 22 mg/dL (ref 6–23)
CO2: 27 meq/L (ref 19–32)
Calcium: 9.3 mg/dL (ref 8.4–10.5)
Chloride: 105 meq/L (ref 96–112)
Creatinine, Ser: 0.8 mg/dL (ref 0.4–1.2)
GFR calc non Af Amer: 74.87 mL/min (ref >60)
Glucose, Bld: 121 mg/dL — ABNORMAL HIGH (ref 70–99)
Potassium: 4.4 meq/L (ref 3.5–5.1)
Sodium: 140 meq/L (ref 135–145)

## 2009-10-27 ENCOUNTER — Telehealth: Payer: Self-pay | Admitting: Internal Medicine

## 2009-11-05 ENCOUNTER — Telehealth: Payer: Self-pay | Admitting: Internal Medicine

## 2009-11-12 ENCOUNTER — Telehealth (INDEPENDENT_AMBULATORY_CARE_PROVIDER_SITE_OTHER): Payer: Self-pay | Admitting: *Deleted

## 2009-11-16 ENCOUNTER — Encounter: Payer: Self-pay | Admitting: Internal Medicine

## 2009-11-17 ENCOUNTER — Telehealth (INDEPENDENT_AMBULATORY_CARE_PROVIDER_SITE_OTHER): Payer: Self-pay | Admitting: *Deleted

## 2009-11-19 ENCOUNTER — Ambulatory Visit: Payer: Self-pay | Admitting: Internal Medicine

## 2009-11-19 ENCOUNTER — Telehealth: Payer: Self-pay | Admitting: Internal Medicine

## 2009-11-19 DIAGNOSIS — R110 Nausea: Secondary | ICD-10-CM | POA: Insufficient documentation

## 2009-11-22 ENCOUNTER — Telehealth: Payer: Self-pay | Admitting: Internal Medicine

## 2009-11-22 LAB — CONVERTED CEMR LAB
ALT: 28 units/L (ref 0–35)
AST: 20 units/L (ref 0–37)
Hemoglobin: 13.9 g/dL (ref 12.0–15.0)
Hgb A1c MFr Bld: 6.4 % (ref 4.6–6.5)

## 2009-11-24 ENCOUNTER — Encounter: Payer: Self-pay | Admitting: Internal Medicine

## 2009-12-06 ENCOUNTER — Ambulatory Visit: Payer: Self-pay | Admitting: Internal Medicine

## 2009-12-06 DIAGNOSIS — Z8711 Personal history of peptic ulcer disease: Secondary | ICD-10-CM | POA: Insufficient documentation

## 2009-12-06 DIAGNOSIS — K449 Diaphragmatic hernia without obstruction or gangrene: Secondary | ICD-10-CM | POA: Insufficient documentation

## 2009-12-07 ENCOUNTER — Emergency Department (HOSPITAL_COMMUNITY): Admission: EM | Admit: 2009-12-07 | Discharge: 2009-12-08 | Payer: Self-pay | Admitting: Emergency Medicine

## 2009-12-08 ENCOUNTER — Telehealth: Payer: Self-pay | Admitting: Internal Medicine

## 2009-12-09 ENCOUNTER — Ambulatory Visit: Payer: Self-pay | Admitting: Pulmonary Disease

## 2009-12-10 ENCOUNTER — Ambulatory Visit (HOSPITAL_COMMUNITY): Admission: RE | Admit: 2009-12-10 | Discharge: 2009-12-10 | Payer: Self-pay | Admitting: Internal Medicine

## 2009-12-13 ENCOUNTER — Encounter (INDEPENDENT_AMBULATORY_CARE_PROVIDER_SITE_OTHER): Payer: Self-pay

## 2009-12-13 ENCOUNTER — Ambulatory Visit: Payer: Self-pay | Admitting: Internal Medicine

## 2009-12-14 ENCOUNTER — Ambulatory Visit: Payer: Self-pay | Admitting: Internal Medicine

## 2009-12-17 ENCOUNTER — Ambulatory Visit: Payer: Self-pay | Admitting: Internal Medicine

## 2009-12-17 ENCOUNTER — Telehealth: Payer: Self-pay | Admitting: Internal Medicine

## 2009-12-20 ENCOUNTER — Telehealth: Payer: Self-pay | Admitting: Internal Medicine

## 2009-12-30 ENCOUNTER — Encounter: Payer: Self-pay | Admitting: Internal Medicine

## 2009-12-30 ENCOUNTER — Telehealth (INDEPENDENT_AMBULATORY_CARE_PROVIDER_SITE_OTHER): Payer: Self-pay | Admitting: *Deleted

## 2010-01-05 ENCOUNTER — Ambulatory Visit (HOSPITAL_COMMUNITY): Admission: RE | Admit: 2010-01-05 | Discharge: 2010-01-05 | Payer: Self-pay | Admitting: Surgery

## 2010-01-21 HISTORY — PX: VENA CAVA FILTER PLACEMENT: SUR1032

## 2010-01-25 ENCOUNTER — Inpatient Hospital Stay (HOSPITAL_COMMUNITY): Admission: RE | Admit: 2010-01-25 | Discharge: 2010-01-28 | Payer: Self-pay | Admitting: Surgery

## 2010-01-25 HISTORY — PX: NISSEN FUNDOPLICATION: SHX2091

## 2010-01-28 ENCOUNTER — Telehealth (INDEPENDENT_AMBULATORY_CARE_PROVIDER_SITE_OTHER): Payer: Self-pay | Admitting: *Deleted

## 2010-02-16 ENCOUNTER — Ambulatory Visit: Payer: Self-pay | Admitting: Internal Medicine

## 2010-02-17 ENCOUNTER — Encounter: Payer: Self-pay | Admitting: Internal Medicine

## 2010-02-17 ENCOUNTER — Telehealth (INDEPENDENT_AMBULATORY_CARE_PROVIDER_SITE_OTHER): Payer: Self-pay | Admitting: *Deleted

## 2010-03-11 ENCOUNTER — Ambulatory Visit: Payer: Self-pay | Admitting: Internal Medicine

## 2010-03-14 LAB — CONVERTED CEMR LAB: Hgb A1c MFr Bld: 6.1 % — ABNORMAL HIGH (ref <5.7)

## 2010-03-23 ENCOUNTER — Encounter: Payer: Self-pay | Admitting: Internal Medicine

## 2010-04-04 ENCOUNTER — Encounter: Admission: RE | Admit: 2010-04-04 | Discharge: 2010-04-04 | Payer: Self-pay | Admitting: Surgery

## 2010-04-12 ENCOUNTER — Telehealth (INDEPENDENT_AMBULATORY_CARE_PROVIDER_SITE_OTHER): Payer: Self-pay | Admitting: *Deleted

## 2010-04-13 ENCOUNTER — Ambulatory Visit: Payer: Self-pay | Admitting: Internal Medicine

## 2010-04-13 DIAGNOSIS — R42 Dizziness and giddiness: Secondary | ICD-10-CM | POA: Insufficient documentation

## 2010-04-14 ENCOUNTER — Encounter: Payer: Self-pay | Admitting: Internal Medicine

## 2010-04-14 ENCOUNTER — Telehealth: Payer: Self-pay | Admitting: Internal Medicine

## 2010-04-14 ENCOUNTER — Ambulatory Visit: Payer: Self-pay | Admitting: Internal Medicine

## 2010-04-14 DIAGNOSIS — E875 Hyperkalemia: Secondary | ICD-10-CM | POA: Insufficient documentation

## 2010-04-14 LAB — CONVERTED CEMR LAB
BUN: 19 mg/dL (ref 6–23)
BUN: 17 mg/dL (ref 6–23)
CO2: 28 meq/L (ref 19–32)
CO2: 28 meq/L (ref 19–32)
Calcium: 9.8 mg/dL (ref 8.4–10.5)
Calcium: 9.2 mg/dL (ref 8.4–10.5)
Chloride: 107 meq/L (ref 96–112)
Chloride: 105 meq/L (ref 96–112)
Creatinine, Ser: 0.8 mg/dL (ref 0.4–1.2)
Creatinine, Ser: 0.79 mg/dL (ref 0.40–1.20)
GFR calc non Af Amer: 70.67 mL/min (ref >60)
Glucose, Bld: 154 mg/dL — ABNORMAL HIGH (ref 70–99)
Glucose, Bld: 157 mg/dL — ABNORMAL HIGH (ref 70–99)
Potassium: 6.4 meq/L (ref 3.5–5.1)
Potassium: 4.2 meq/L (ref 3.5–5.3)
Sodium: 143 meq/L (ref 135–145)
Sodium: 140 meq/L (ref 135–145)

## 2010-04-15 ENCOUNTER — Ambulatory Visit: Payer: Self-pay | Admitting: Internal Medicine

## 2010-04-15 DIAGNOSIS — J309 Allergic rhinitis, unspecified: Secondary | ICD-10-CM | POA: Insufficient documentation

## 2010-04-19 ENCOUNTER — Telehealth (INDEPENDENT_AMBULATORY_CARE_PROVIDER_SITE_OTHER): Payer: Self-pay | Admitting: *Deleted

## 2010-04-22 ENCOUNTER — Encounter: Payer: Self-pay | Admitting: Internal Medicine

## 2010-05-20 ENCOUNTER — Ambulatory Visit: Payer: Self-pay | Admitting: Internal Medicine

## 2010-05-20 LAB — CONVERTED CEMR LAB
BUN: 13 mg/dL (ref 6–23)
Basophils Absolute: 0 10*3/uL (ref 0.0–0.1)
Basophils Relative: 0.4 % (ref 0.0–3.0)
CO2: 27 meq/L (ref 19–32)
Calcium: 8.9 mg/dL (ref 8.4–10.5)
Chloride: 101 meq/L (ref 96–112)
Creatinine, Ser: 0.7 mg/dL (ref 0.4–1.2)
Eosinophils Absolute: 0.1 10*3/uL (ref 0.0–0.7)
Eosinophils Relative: 1.6 % (ref 0.0–5.0)
GFR calc non Af Amer: 87.2 mL/min (ref >60)
Glucose, Bld: 172 mg/dL — ABNORMAL HIGH (ref 70–99)
HCT: 39.7 % (ref 36.0–46.0)
Hemoglobin: 13.4 g/dL (ref 12.0–15.0)
INR: 1 (ref 0.8–1.0)
Lymphocytes Relative: 32.8 % (ref 12.0–46.0)
Lymphs Abs: 2.3 10*3/uL (ref 0.7–4.0)
MCHC: 33.7 g/dL (ref 30.0–36.0)
MCV: 90.9 fL (ref 78.0–100.0)
Monocytes Absolute: 0.4 10*3/uL (ref 0.1–1.0)
Monocytes Relative: 6.2 % (ref 3.0–12.0)
Neutro Abs: 4.2 10*3/uL (ref 1.4–7.7)
Neutrophils Relative %: 59 % (ref 43.0–77.0)
Platelets: 184 10*3/uL (ref 150.0–400.0)
Potassium: 3.7 meq/L (ref 3.5–5.1)
Prothrombin Time: 11.1 s (ref 9.7–11.8)
RBC: 4.37 M/uL (ref 3.87–5.11)
RDW: 14 % (ref 11.5–14.6)
Sodium: 135 meq/L (ref 135–145)
WBC: 7.1 10*3/uL (ref 4.5–10.5)
aPTT: 28.5 s (ref 21.7–28.8)

## 2010-05-23 HISTORY — PX: CARDIAC CATHETERIZATION: SHX172

## 2010-05-27 ENCOUNTER — Ambulatory Visit (HOSPITAL_COMMUNITY): Admission: RE | Admit: 2010-05-27 | Discharge: 2010-05-27 | Payer: Self-pay | Admitting: Cardiovascular Disease

## 2010-05-27 ENCOUNTER — Ambulatory Visit: Payer: Self-pay | Admitting: Cardiovascular Disease

## 2010-05-27 ENCOUNTER — Encounter: Payer: Self-pay | Admitting: Internal Medicine

## 2010-05-27 ENCOUNTER — Inpatient Hospital Stay (HOSPITAL_BASED_OUTPATIENT_CLINIC_OR_DEPARTMENT_OTHER): Admission: RE | Admit: 2010-05-27 | Discharge: 2010-05-27 | Payer: Self-pay | Admitting: Internal Medicine

## 2010-06-13 ENCOUNTER — Ambulatory Visit: Payer: Self-pay | Admitting: Internal Medicine

## 2010-06-17 ENCOUNTER — Encounter (INDEPENDENT_AMBULATORY_CARE_PROVIDER_SITE_OTHER): Payer: Self-pay | Admitting: Emergency Medicine

## 2010-06-17 ENCOUNTER — Emergency Department (HOSPITAL_COMMUNITY): Admission: EM | Admit: 2010-06-17 | Discharge: 2010-06-17 | Payer: Self-pay | Admitting: Emergency Medicine

## 2010-06-17 ENCOUNTER — Ambulatory Visit: Payer: Self-pay | Admitting: Vascular Surgery

## 2010-06-17 ENCOUNTER — Telehealth: Payer: Self-pay | Admitting: Internal Medicine

## 2010-06-22 ENCOUNTER — Ambulatory Visit (HOSPITAL_COMMUNITY): Admission: RE | Admit: 2010-06-22 | Discharge: 2010-06-22 | Payer: Self-pay | Admitting: Internal Medicine

## 2010-06-22 ENCOUNTER — Ambulatory Visit: Payer: Self-pay | Admitting: Internal Medicine

## 2010-06-22 ENCOUNTER — Encounter: Payer: Self-pay | Admitting: Internal Medicine

## 2010-06-30 ENCOUNTER — Ambulatory Visit: Payer: Self-pay | Admitting: Internal Medicine

## 2010-07-01 ENCOUNTER — Telehealth: Payer: Self-pay | Admitting: Internal Medicine

## 2010-07-05 ENCOUNTER — Ambulatory Visit (HOSPITAL_COMMUNITY): Admission: RE | Admit: 2010-07-05 | Discharge: 2010-07-05 | Payer: Self-pay | Admitting: Internal Medicine

## 2010-07-05 ENCOUNTER — Encounter: Payer: Self-pay | Admitting: Internal Medicine

## 2010-07-08 ENCOUNTER — Ambulatory Visit: Payer: Self-pay | Admitting: Internal Medicine

## 2010-07-08 ENCOUNTER — Telehealth: Payer: Self-pay | Admitting: Internal Medicine

## 2010-07-26 ENCOUNTER — Encounter: Payer: Self-pay | Admitting: Internal Medicine

## 2010-08-26 ENCOUNTER — Encounter: Payer: Self-pay | Admitting: Internal Medicine

## 2010-08-31 ENCOUNTER — Ambulatory Visit: Payer: Self-pay | Admitting: Internal Medicine

## 2010-09-01 ENCOUNTER — Encounter: Admission: RE | Admit: 2010-09-01 | Discharge: 2010-09-01 | Payer: Self-pay | Admitting: Neurology

## 2010-09-07 ENCOUNTER — Encounter (HOSPITAL_COMMUNITY)
Admission: RE | Admit: 2010-09-07 | Discharge: 2010-11-22 | Payer: Self-pay | Source: Home / Self Care | Attending: Internal Medicine | Admitting: Internal Medicine

## 2010-09-28 ENCOUNTER — Ambulatory Visit: Payer: Self-pay | Admitting: Internal Medicine

## 2010-10-03 LAB — CONVERTED CEMR LAB
Creatinine,U: 139.5 mg/dL
Hgb A1c MFr Bld: 6.3 % (ref 4.6–6.5)
Microalb Creat Ratio: 1.4 mg/g (ref 0.0–30.0)
Microalb, Ur: 1.9 mg/dL (ref 0.0–1.9)

## 2010-10-28 ENCOUNTER — Encounter: Payer: Self-pay | Admitting: Internal Medicine

## 2010-11-20 LAB — CONVERTED CEMR LAB
INR: 2.4
INR: 2.4
Prothrombin Time: 18.8 s

## 2010-11-22 NOTE — Progress Notes (Signed)
Summary: Surgical Consult Appt.   Phone Note From Other Clinic   Summary of Call: Pt. is in [facility] recovery, per Dr.Carmichael Burdette, she needs a surgical consult for [facility] repair. She will see Dr.Mat Daphine Deutscher at [facility] on 12-30-09 arriving at 11:20am for 11:40am appt. All records have been faxed. Appt. information given to Quincy Carnes RN in [facility] recovery. I will also mail pt. an appt. reminder phamplet from [facility]. Pt. instructed to call back as needed.  Initial call taken by: Laureen Ochs LPN,  December 17, 2009 3:43 PM

## 2010-11-22 NOTE — Progress Notes (Signed)
Summary: referral to Dr. Sandria Manly   Phone Note Call from Patient Call back at Home Phone [phone]   Caller: Patient Reason for Call: Talk to Nurse, Referral Details for Reason: Dr. Sandria Manly office  Initial call taken by: Lorne Skeens,  July 01, 2010 10:34 AM  Follow-up for Phone Call        pt tried to sch appt w/Dr Love but was told it had been 3 years since she was there and she needed a new pt referral, Jesusita Oka is that ok to refer? Meredith Staggers, RN  July 01, 2010 5:17 PM   Additional Follow-up for Phone Call Additional follow up Details #1::        yes. please. did they ever fax over the list of blood tests to order for her to rule out neuromuscular disaeses? Dolores Patty, MD, [facility]  July 04, 2010 11:11 PM  received labs will call pt to sch, she is having pfts and fluro today, order placed for neuro referral Meredith Staggers, RN  July 05, 2010 9:49 AM      Additional Follow-up for Phone Call Additional follow up Details #2::    pt was given xray results, she will come for labwork on Fri 9/16, order in [facility], RN  July 07, 2010 1:23 PM

## 2010-11-22 NOTE — Progress Notes (Signed)
Summary: rx magic mouthwash  Phone Note Call from Patient Call back at Home Phone [phone]   Caller: Patient Summary of Call: pt called requesting rx for Magic mouth wash, has used before, removed from med list 11/19/09. -symptoms has come back, dry mouth, scaly under lip --pt has appt 01/25/10 Use [facility] [address] Initial call taken by: Kandice Hams,  December 30, 2009 4:51 PM  Follow-up for Phone Call        please RF  RX faxed left msg for pt .Kandice Hams  December 31, 2009 11:04 AM  Follow-up by: Nolon Rod. Paz MD,  December 31, 2009 10:17 AM    New/Updated Medications: * MAGIC MOUTHWASH swish and spit three times a day for a week Prescriptions: MAGIC MOUTHWASH swish and spit three times a day for a week  #5cc x 0   Entered by:   Kandice Hams   Authorized by:   Nolon Rod. Paz MD   Signed by:   Kandice Hams on 12/31/2009   Method used:   Faxed to ...       [facility]. [phone]* (retail)       [address]       [place], [place]  [address]       Ph: [phone]       Fax: [phone]   RxID:   [id number]

## 2010-11-22 NOTE — Assessment & Plan Note (Signed)
Summary: 1 month rov.sl  Medications Added TRIAMCINOLONE ACETONIDE 0.1 % LOTN (TRIAMCINOLONE ACETONIDE) UAD- as needed      Allergies Added:   Referring Provider:  n/a Primary Provider:  [name], MD  CC:  1 month rov.  Pt reports that she is still having difficulty breathing.  [name]  History of Present Illness: The patient presents today for routine cardiology followup.  She reports having some improvement in her orthostasis but continues to have profound SOB upon standing.  Though her SOB is not entirely exertional, it appears worse clearly when standing.  She also notes that her lips are frequently blue.  She also reports ongoing episodes of chest tightness with exertion. The patient denies symptoms of palpitations, chest pain,  orthopnea, PND, lower extremity edema,presyncope, syncope, or neurologic sequela. The patient is tolerating medications without difficulties and is otherwise without complaint today.    Current Medications (verified): 1)  Amlodipine Besy-Benazepril Hcl 5-10 Mg Caps (Amlodipine Besy-Benazepril Hcl) .... One By Mouth Daily 2)  Meclizine Hcl 25 Mg Tabs (Meclizine Hcl) .[name].. 1 By Mouth As Needed Dizzy 3)  Low-Dose Aspirin 81 Mg  Tabs (Aspirin) .... Take 1 Tablet By Mouth Once A Day 4)  Glucometer and Suplies .... Check Once A Day 5)  Freestyle Lite   Strp (Glucose Blood) .... Check Bs Two Times A Day Dx 250.00 6)  Freestyle Lancets   Misc (Lancets) .... Use Two Times A Day Dx 250.00 7)  Magnesium Powder .[name].. 3 Tsp Once Daily 8)  Trivita Nerve Formula .... Take By Mouth Daily 9)  Eye Vitamin 10)  Lutein 11)  Voltaren Gel .... Apply As Needed To Ankle Per Patient 12)  Vitamin D 2000 Unit Tabs (Cholecalciferol) .... 3 Capsules Daily 13)  Calcium 14)  Boron 15)  Vicodin Hp 10-660 Mg Tabs (Hydrocodone-Acetaminophen) .... One By Mouth Every 4 Hours As Needed For Pain 16)  Fludrocortisone Acetate 0.1 Mg Tabs (Fludrocortisone Acetate) .... One By Mouth Daily 17)  Magic  Mouth Wash .... 5 Cc Every 6 Hours As Needed 18)  Veramyst 27.5 Mcg/spray Susp (Fluticasone Furoate) .... 2 Sprays On Each Side of The Nose Once Daily 19)  Triamcinolone Acetonide 0.1 % Lotn (Triamcinolone Acetonide) .... Uad- As Needed  Allergies (verified): 1)  ! Talwin 2)  ! Methadone Hcl 3)  ! Tranxene-T 4)  ! * ? Antibiotics 5)  ! * Cefazolin 6)  ! Doxycycline 7)  ! * Enablex 8)  ! * Dilaudid 9)  ! Adhesive Tape  Past History:  Past Surgical History: Last updated: 04/15/2010 Cholecystectomy Hysterectomy w/o bilateral salpingo-oophorectomy hx of cataract sx  rotator cuff surgery 10-08 Dr Eulah Pont Cspine surgery for OA --fussion, Dr Ophelia Charter (12-18-07) (f/u by a PE) 3th R toe amputation d/t osteomyelitis 11-09 Dr Lajoyce Corners  hiatal hernia surgery, [facility] Funda[facility]---(01/25/10)  Family History: Last updated: 09/29/2008 Family Hsitory Headaches Family History Hypertension heart disease: mother (mitral valve replaced) rheumatism: mother No FH of Colon Cancer:  Social History: Last updated: 07/17/2008 Divorced daughter lives with her pt has children. pt is retired from [employer], site.  Patient has never smoked.  Alcohol Use - no Illicit Drug Use - no  Past Medical History: PE and DVT 12-2007, after neck surgery, CT chest 10-09 neg for PE , coumadin d/c 10-2008 CP 06-2008: admited, r/o for MI, ECHO normal; symptoms likely GI stress test abnormal 2007, but  normal 03-2009 HYPERTENSION  HYPERLIPIDEMIA Large hiatal hernia repair 01/25/10 DIABETES MELLITUS ?asthma (SOB when attempted  to  d/c singulair 11-2008) Pulmonary Nodule , incidental per CT: PET scan 4-9: likely  benign, CT 08-2009 no change, no further CTs( Dr Shelle Iron) REFLEX SYMPATHETIC DYSTROPHY  and Neuropathy Osteoporosis Hemorrhoids h/o IBS h/o colon polyps and DIVERTICULOSIS h/ o +PPD HX of Malignant Colon Polyp in 1994, colonoscopy again 8-10, had a biopsy. Next 2015 Dx w/  early macular degeneration June 2010  (Dr Earlene Plater) Orthostatic intolerance Allergic rhinitis  Family History: Reviewed history from 09/29/2008 and no changes required. Family Hsitory Headaches Family History Hypertension heart disease: mother (mitral valve replaced) rheumatism: mother No FH of Colon Cancer:  Social History: Reviewed history from 07/17/2008 and no changes required. Divorced daughter lives with her pt has children. pt is retired from [employer], site.  Patient has never smoked.  Alcohol Use - no Illicit Drug Use - no  Review of Systems       All systems are reviewed and negative except as listed in the HPI.   Vital Signs:  Patient profile:   76 year old female Height:      65 inches Weight:      194 pounds BMI:     32.40 Pulse rate:   88 / minute Pulse (ortho):   97 / minute Pulse rhythm:   regular BP sitting:   131 / 80  (left arm) BP standing:   116 / 75 Cuff size:   regular  Vitals Entered By: Judithe Modest CMA (May 20, 2010 12:16 PM)  Serial Vital Signs/Assessments:  Time      Position  BP       Pulse  Resp  Temp     By           Lying RA  132/74   86                    Jewel Hardy CMA           Sitting   125/82   92                    Jewel Hardy CMA           Standing  116/75   97                    Jewel Hardy CMA           Standing  148/87   90                    Jewel Hardy CMA           Standing  156/85   95                    Jewel Hardy CMA  Comments: Dizziness/ SOB/ Fingers turned blue By: Laurance Flatten CMA    Physical Exam  General:  Well developed, well nourished, in no acute distress. Head:  normocephalic and atraumatic Eyes:  PERRLA/EOM intact; conjunctiva and lids normal. Mouth:  Teeth, gums and palate normal. Oral mucosa normal. Neck:  Neck supple, no JVD. No masses, thyromegaly or abnormal cervical nodes. Lungs:  normal respiratory effort, no intercostal retractions, and no accessory muscle use.   Heart:  normal rate, regular rhythm, and no murmur.     Abdomen:  Bowel sounds positive; abdomen soft and non-tender without masses, organomegaly, or hernias noted. No hepatosplenomegaly. Msk:  Back normal, normal gait. Muscle strength and tone normal. Pulses:  pulses normal in all 4  extremities Extremities:  No clubbing or cyanosis. Neurologic:  Alert and oriented x 3.   Impression & Recommendations:  Problem # 1:  DYSPNEA (ICD-786.05) The patient continues to have dypsnea of an unclear etiology.  She appears to have dypsnea predominantly upon standing (platypnea).   She has had multiple evaluations previously without recurrence of prior PTE.  I am concerned for a possible cardiac cause. Prior CTAs of the chest do not reveal AVM.   I have recommended TEE with bubble study and also RHC with shunt series to rule out shunt as a cause.  In addition, we will obtain LHC to rule out ischemia. I will likely obtain a RUQ [place] upon return to also rule out cirrhosis as a cause. The patient is in agreement with the above plan.  Problem # 2:  ORTHOSTATIC DIZZINESS (ICD-780.4) improved with low dose florinef she is not orthostatic today adequate hydration is again encouraged  Problem # 3:  HYPERTENSION (ICD-401.9) stable given orthostatic dizziness, I do not plan aggressive BP control at this time  Other Orders: TLB-BMP (Basic Metabolic Panel-BMET) (80048-METABOL) TLB-CBC Platelet - w/Differential (85025-CBCD) TLB-PT (Protime) (85610-PTP) TLB-PTT (85730-PTTL)  Patient Instructions: 1)  Your physician has requested that you have a TEE.  During a TEE, sound waves are used to create images of your heart. It provides your doctor with information about the size and shape of your heart and how well your heart's chambers and valves are working. In this test, a transducer is attached to the end of a flexible tube that's guided down your throat and into your esophagus (the tube leading from your mouth to your stomach) to get a more detailed image of your heart.  You are not awake for the procedure. Please see the instruction sheet given to you today.  For further information please visit [email or web address] 2)  Your physician has requested that you have a cardiac catheterization.  Cardiac catheterization is used to diagnose and/or treat various heart conditions. Doctors may recommend this procedure for a number of different reasons. The most common reason is to evaluate chest pain. Chest pain can be a symptom of coronary artery disease (CAD), and cardiac catheterization can show whether plaque is narrowing or blocking your heart's arteries. This procedure is also used to evaluate the valves, as well as measure the blood flow and oxygen levels in different parts of your heart.  For further information please visit [email or web address]  Please follow instruction sheet, as given.

## 2010-11-22 NOTE — Progress Notes (Signed)
Summary: ?metoprolol  Phone Note Call from Patient Call back at Home Phone [phone]   Summary of Call: Patient wants to know if she is to continue metoprolol?  if so, needs new rx [name]  November 19, 2009 3:14 PM   Follow-up for Phone Call        discussed with dr Drue Novel -- no, metoprolol, pt aware Follow-up by: [name],  November 19, 2009 4:50 PM

## 2010-11-22 NOTE — Assessment & Plan Note (Signed)
Summary: 1 MONTH FOLLOWUP /ALR   Vital Signs:  Patient profile:   76 year old female Height:      65 inches Weight:      193.38 pounds BMI:     32.30 Temp:     97.9 degrees F oral Pulse rate:   70 / minute BP sitting:   130 / 80  Vitals Entered By: Kandice Hams (November 19, 2009 12:49 PM) CC: followup   History of Present Illness: follow up office visit pain management taking up to 6 ultram daily for control of her leg pain, OA and  RSD not taking elavil  due to dry mouth Will not take Neurontin as her mother had side effects  continue w/  shortness of breath typically her symptoms are associated with doing dishes and back pain. Dyspnea on exertion?  She does not walk much  Blood in stools  also has seen some blood in stools on and off in the last few weeks, mostly on the toilet paper and  with bowel movements denies vomiting or diarrhea, some nausea mostly in the mornings.  The nausea started even before she started  Ultram. denies heartburn last colonoscopy was 8/10  Allergies: 1)  ! Talwin 2)  ! Methadone Hcl 3)  ! Tranxene-T 4)  ! * ? Antibiotics 5)  ! * Cefazolin 6)  ! Doxycycline 7)  ! * Enablex 8)  ! Adhesive Tape  Physical Exam  General:  alert and well-developed.   Lungs:  Normal respiratory effort, chest expands symmetrically. Lungs are clear to auscultation, no crackles or wheezes. Heart:  Normal rate and regular rhythm. S1 and S2 normal without gallop, murmur, click, rub or other extra sounds. Abdomen:  soft, non-tender, no distention, no masses, no guarding, and no rigidity.   Extremities:  no edema   Impression & Recommendations:  Problem # 1:  DYSPNEA (ICD-786.05) status post pulmonary w/u (chest x-ray, v/q , echo)----> negative, no pulmonary etiology to explain her symptoms echo did show  diastolic dysfunction ---> trial with beta-blockers didn't help,will discontinue beta-blockers the etiology of her symptoms  unclear deconditioning? Plan: Patient requests cardiology re-evaluation, although i/m not sure there is a cardiac source for her symptoms will ask cards to re-eval  Problem # 2:  DEGENERATIVE JOINT DISEASE (ICD-715.90) taking up to 6 ultram daily for control of her leg pain, OA and  RSD, previously well controlled on Darvocet but Darvocet is off the market hydrocodone was not effective not taking elavil  due to dry mouth Will not take Neurontin as her mother had side effects she's taking high doses of Ultram, if she is not satisfied with pain control at  high doses of Ultram, will  refer to pain management  Her updated medication list for this problem includes:    Celebrex 200 Mg Caps (Celecoxib) .Marland Kitchen... 1 by mouth once daily    Low-dose Aspirin 81 Mg Tabs (Aspirin) .Marland Kitchen... Take 1 tablet by mouth once a day    Ultram 50 Mg Tabs (Tramadol hcl) .Marland Kitchen... 2 by mouth three times a day  Problem # 3:  REFLEX SYMPATHETIC DYSTROPHY (ICD-337.20) see #1  Problem # 4:  NAUSEA (ICD-787.02) Assessment: New having nausea for a while no other symptoms, BRBPR likely unrelated gastroparesis?  PUD? (on Celebrex) plan: Observation, call if symptoms increase Her updated medication list for this problem includes:    Meclizine Hcl 25 Mg Tabs (Meclizine hcl) .Marland Kitchen... 1 by mouth as needed dizzy    Allegra 180 Mg  Tabs (Fexofenadine hcl) .Marland Kitchen... 1 by mouth once daily  Orders: TLB-Hemoglobin (Hgb) (85018-HGB)  Problem # 5:  extensive discussion about her sx , also extensive chart review , F2F > 25 min  Problem # 6:  DIABETES MELLITUS, TYPE II (ICD-250.00) due for labs Her updated medication list for this problem includes:    Low-dose Aspirin 81 Mg Tabs (Aspirin) .Marland Kitchen... Take 1 tablet by mouth once a day    Amlodipine Besy-benazepril Hcl 5-10 Mg Caps (Amlodipine besy-benazepril hcl) ..... One by mouth daily  Orders: Venipuncture (16109) TLB-ALT (SGPT) (84460-ALT) TLB-AST (SGOT) (84450-SGOT) TLB-A1C / Hgb A1C  (Glycohemoglobin) (83036-A1C)  Complete Medication List: 1)  Meclizine Hcl 25 Mg Tabs (Meclizine hcl) .Marland Kitchen.. 1 by mouth as needed dizzy 2)  Celebrex 200 Mg Caps (Celecoxib) .Marland Kitchen.. 1 by mouth once daily 3)  Nexium 40 Mg Cpdr (Esomeprazole magnesium) .... Take 1 tablet by mouth two times a day 4)  Allegra 180 Mg Tabs (Fexofenadine hcl) .Marland Kitchen.. 1 by mouth once daily 5)  Low-dose Aspirin 81 Mg Tabs (Aspirin) .... Take 1 tablet by mouth once a day 6)  Glucometer and Suplies  .... Check once a day 7)  Freestyle Lite Strp (Glucose blood) .... Check bs two times a day dx 250.00 8)  Freestyle Lancets Misc (Lancets) .... Use two times a day dx 250.00 9)  Calcium + D 600-200 Mg-unit Tabs (Calcium carbonate-vitamin d) .... Take 3 tabs by mouth daily 10)  Boron 3mg   .... 3 by mouth qd 11)  Magnesium Powder  .Marland Kitchen.. 3 tsp once daily 12)  Trivita Nerve Formula  .... Take by mouth daily 13)  Trimethoprim 100 Mg Tabs (Trimethoprim) .... Take 1 tab by mouth as needed 14)  Singulair 10 Mg Tabs (Montelukast sodium) .Marland Kitchen.. 1 by mouth once daily 15)  Mvi  16)  Eye Vitamin  17)  Lutein  18)  Vitamin D3 10000 Unit Caps (Cholecalciferol) .... 3 caps by mouth daily 19)  Flexcin Joint  .... Take 1 tablet by mouth once a day 20)  Amlodipine Besy-benazepril Hcl 5-10 Mg Caps (Amlodipine besy-benazepril hcl) .... One by mouth daily 21)  Ultram 50 Mg Tabs (Tramadol hcl) .... 2 by mouth three times a day 22)  Voltaren Gel  .... Apply as needed to ankle per patient  Patient Instructions: 1)  Please schedule a follow-up appointment in 3 months .   Appended Document: 1 MONTH FOLLOWUP Apolonio Schneiders     Clinical Lists Changes  Orders: Added new Referral order of Cardiology Referral (Cardiology) - Signed

## 2010-11-22 NOTE — Progress Notes (Signed)
Summary: ?ultram  Phone Note Call from Patient Call back at Home Phone [phone]   Summary of Call: Patient is currently taking ultram 50mg  three times a day.  Patient would like to know if you can increase dosage because it is not helping.  Patient states that she is not sleeping due to pain Initial call taken by: Shary Decamp,  November 05, 2009 4:12 PM  Follow-up for Phone Call        discussed with Dr. Drue Novel, ok to increase to qid.  Patient aware.  Patient wants to know if she should be referred to card.  She is still having a lot of back pain & dyspnea.  She will load the dishwasher & just "give out" & have a lot of pain after that.  Has ROV scheduled with Dr. Drue Novel on 1/28 Follow-up by: Shary Decamp,  November 05, 2009 4:41 PM  Additional Follow-up for Phone Call Additional follow up Details #1::        will discuss @ OV Tessica Cupo E. Mamta Rimmer MD  November 08, 2009 8:38 AM   Discussed with pt about cardio referral.  Patient states that she has increased her ultram to qid & it does not help.  She states that she was reading the package insert & it stated that she could increase to a max of 400mg /day????  Patient wants to know if you will increase. She is taking 2 ultram @ night & it is still not controlling pain.  She is waking up with pain  Additional Follow-up by: Shary Decamp,  November 08, 2009 4:24 PM    Additional Follow-up for Phone Call Additional follow up Details #2::    ok to increase tramadol to 2 by mouth three times a day  also may like to increase elavil to 100mg  at bedtime if not better or if this plan is not satrisfactory, refer to pain medicine please  Chaley Castellanos E. Burnis Kaser MD  November 09, 2009 11:55 AM   Discussed with pt.  Patient states that she has d/c'd elavil (med list updated) because dentist told her that would cause her to have dry mouth.  Patient wants to know if we can give her something else.  She states that she will NOT take neurontin because "it messed her mother up".   Advised pt that we would discuss @ next rov. Shary Decamp  November 09, 2009 3:00 PM  agree Nolon Rod. Meshulem Onorato MD  November 10, 2009 9:58 AM   New/Updated Medications: ULTRAM 50 MG TABS (TRAMADOL HCL) 1 by mouth qid ULTRAM 50 MG TABS (TRAMADOL HCL) 2 by mouth three times a day

## 2010-11-22 NOTE — Progress Notes (Signed)
Summary: FYI  Phone Note Call from Patient   Caller: Daughter Angelique Blonder Summary of Call: Daughter called , pt went to [facility] ED last night, SOB they suggested pt go back to Dr Shelle Iron, pt has ov tomorrow 9:45 am with pulmonary Initial call taken by: Kandice Hams,  December 08, 2009 4:31 PM

## 2010-11-22 NOTE — Progress Notes (Signed)
Summary: FMLA forms  Phone Note Call from Patient Call back at [facility] = [phone]   Caller: Beverly Sessions Summary of Call: PATIENT'S DAUGHTER VICKI BROUGHT IN FMLA PAPERWORK TO BE FILLED OUT SO SHE CAN TAKE CARE OF HER MOTHER--PLEASE COMPLETE AND Graciella Freer AT [phone] TO PICK UP Initial call taken by: Jerolyn Shin,  January 28, 2010 3:14 PM  Follow-up for Phone Call        Left messsage for daughter to return call re:FMLA papers Shary Decamp  January 31, 2010 8:27 AM  Daughter took pt to ED on 12/08/09. 12/08/09 needs to be starting date on FMLA forms.  Daughter is the caretaker of patient & needs form so that she can take mom to appts, etc Shary Decamp  January 31, 2010 8:48 AM   Additional Follow-up for Phone Call Additional follow up Details #1::        forms completed & daughter aware ready for pick up Shary Decamp  January 31, 2010 11:11 AM

## 2010-11-22 NOTE — Letter (Signed)
Summary: neck pain, conservative treatment-[facility]  [facility]   Imported By: Lanelle Bal 08/17/2010 09:46:48  _____________________________________________________________________  External Attachment:    Type:   Image     Comment:   External Document

## 2010-11-22 NOTE — Letter (Signed)
Summary: [facility] - Spirometry  [facility] - Spirometry   Imported By: Marylou Mccoy 07/26/2010 12:01:58  _____________________________________________________________________  External Attachment:    Type:   Image     Comment:   External Document

## 2010-11-22 NOTE — Letter (Signed)
Summary: high risk surgical candidate, increase vicodin--ortho  [name]   Imported By: Lanelle Bal 05/02/2010 13:21:11  _____________________________________________________________________  External Attachment:    Type:   Image     Comment:   External Document

## 2010-11-22 NOTE — Assessment & Plan Note (Signed)
Summary: eph/jml    Visit Type:  Follow-up Referring Momin Misko:  n/a Primary Asenath Balash:  Mary Ora, MD   History of Present Illness: The patient presents today for routine cardiology followup.  She reports having some improvement in her orthostasis but continues to have SOB upon standing.  Though her SOB is not entirely exertional, it appears worse clearly when standing.  She also notes that her lips are frequently blue.  She also reports ongoing episodes of chest tightness with exertion. The patient denies symptoms of palpitations, chest pain,  orthopnea, PND, lower extremity edema,presyncope, syncope, or neurologic sequela. The patient is tolerating medications without difficulties and is otherwise without complaint today.   Current Medications (verified): 1)  Amlodipine Besy-Benazepril Hcl 5-10 Mg Caps (Amlodipine Besy-Benazepril Hcl) .... One By Mouth Daily 2)  Meclizine Hcl 25 Mg Tabs (Meclizine Hcl) .Marland Kitchen.. 1 By Mouth As Needed Dizzy 3)  Low-Dose Aspirin 81 Mg  Tabs (Aspirin) .... Take 1 Tablet By Mouth Once A Day 4)  Glucometer and Suplies .... Check Once A Day 5)  Freestyle Lite   Strp (Glucose Blood) .... Check Bs Two Times A Day Dx 250.00 6)  Freestyle Lancets   Misc (Lancets) .... Use Two Times A Day Dx 250.00 7)  Magnesium Powder .Marland Kitchen.. 3 Tsp Once Daily 8)  Trivita Nerve Formula .... Take By Mouth Daily 9)  Eye Vitamin 10)  Lutein 11)  Voltaren Gel .... Apply As Needed To Ankle Per Patient 12)  Vitamin D 2000 Unit Tabs (Cholecalciferol) .... 3 Capsules Daily 13)  Calcium 14)  Boron 15)  Vicodin Hp 10-660 Mg Tabs (Hydrocodone-Acetaminophen) .... One By Mouth Every 4 Hours As Needed For Pain 16)  Fludrocortisone Acetate 0.1 Mg Tabs (Fludrocortisone Acetate) .... One By Mouth Daily 17)  Magic Mouth Wash .... 5 Cc Every 6 Hours As Needed 18)  Veramyst 27.5 Mcg/spray Susp (Fluticasone Furoate) .... 2 Sprays On Each Side of The Nose Once Daily  Allergies: 1)  ! Talwin 2)  ! Methadone  Hcl 3)  ! Tranxene-T 4)  ! * ? Antibiotics 5)  ! * Cefazolin 6)  ! Doxycycline 7)  ! * Enablex 8)  ! * Dilaudid 9)  ! Adhesive Tape  Past History:  Past Medical History: Reviewed history from 05/20/2010 and no changes required. PE and DVT 12-2007, after neck surgery, CT chest 10-09 neg for PE , coumadin d/c 10-2008 CP 06-2008: admited, r/o for MI, ECHO normal; symptoms likely GI stress test abnormal 2007, but  normal 03-2009 HYPERTENSION  HYPERLIPIDEMIA Large hiatal hernia repair 01/25/10 DIABETES MELLITUS ?asthma (SOB when attempted  to d/c singulair 11-2008) Pulmonary Nodule , incidental per CT: PET scan 4-9: likely  benign, CT 08-2009 no change, no further CTs( Dr Shelle Iron) REFLEX SYMPATHETIC DYSTROPHY  and Neuropathy Osteoporosis Hemorrhoids h/o IBS h/o colon polyps and DIVERTICULOSIS h/ o +PPD HX of Malignant Colon Polyp in 1994, colonoscopy again 8-10, had a biopsy. Next 2015 Dx w/  early macular degeneration June 2010 (Dr Earlene Plater) Orthostatic intolerance Allergic rhinitis  Past Surgical History: Reviewed history from 04/15/2010 and no changes required. Cholecystectomy Hysterectomy w/o bilateral salpingo-oophorectomy hx of cataract sx  rotator cuff surgery 10-08 Dr Eulah Pont Cspine surgery for OA --fussion, Dr Ophelia Charter (12-18-07) (f/u by a PE) 3th R toe amputation d/t osteomyelitis 11-09 Dr Lajoyce Corners  hiatal hernia surgery, Nissen Fundaplication---(01/25/10)  Social History: Reviewed history from 07/17/2008 and no changes required. Divorced daughter lives with her pt has children. pt is retired from [employer], site.  Patient has never smoked.  Alcohol Use - no Illicit Drug Use - no  Review of Systems       All systems are reviewed and negative except as listed in the HPI.   Vital Signs:  Patient profile:   76 year old female Height:      65 inches Weight:      196 pounds BMI:     32.73 Pulse rate:   83 / minute BP sitting:   148 / 88  (left arm)  Vitals Entered By:  Laurance Flatten CMA (June 13, 2010 10:45 AM)  Physical Exam  General:  Well developed, well nourished, in no acute distress. Head:  normocephalic and atraumatic Eyes:  PERRLA/EOM intact; conjunctiva and lids normal. Mouth:  Teeth, gums and palate normal. Oral mucosa normal. Neck:  Neck supple, no JVD. No masses, thyromegaly or abnormal cervical nodes. Lungs:  normal respiratory effort, no intercostal retractions, and no accessory muscle use.   Heart:  normal rate, regular rhythm, and no murmur.   Abdomen:  Bowel sounds positive; abdomen soft and non-tender without masses, organomegaly, or hernias noted. No hepatosplenomegaly. Msk:  Back normal, normal gait. Muscle strength and tone normal. Pulses:  pulses normal in all 4 extremities Extremities:  No clubbing or cyanosis. Neurologic:  Alert and oriented x 3.    Cardiac Cath  Procedure date:  05/27/2010  Findings:      ASSESSMENT:   1. Nonobstructive coronary artery disease primarily in the left       anterior descending with mostly branch vessel disease.   2. Mild pulmonary venous hypertension.   3. No evidence of intracardiac shunt or platypnea-orthodeoxia.      PLAN/DISCUSSION:  Her cardiac catheterization was essentially   unrevealing.  We will proceed with TEE to further evaluate.  I suspect   her symptoms may be mostly due to orthostatic hypotension.  However, she   does continue to have dyspnea, could consider CPX testing or VQ scanning   as needed.               Bevelyn Buckles. Bensimhon, MD       EKG  Procedure date:  06/13/2010  Findings:      sinus rhythm 83 bpm, otherwise normal ekg  Impression & Recommendations:  Problem # 1:  DYSPNEA (ICD-786.05) The patient has SOB of unclear etiology.  She appears to have dypsnea predominantly upon standing (platypnea).   She has had multiple evaluations previously without recurrence of prior PTE.   Prior CTAs of the chest do not reveal AVM.  Her recent TEE and RHC/LCH  were also without findings to explain her symptoms. At this point, I would recommend CPX testing.  THis can be performed by Dr Gala Romney.  He and I discussed the patient at length this am and reviewed the results of her TEE/RHC/LCH.  I will ask him to see her in follow-up after CPX testing as I have no further recommendations. I will see her as needed.  In addition, I have recommended that she continue to follow-up with her PCP for other potential causes for her symptoms.  Problem # 2:  ORTHOSTATIC DIZZINESS (ICD-780.4) resolved at this time  Problem # 3:  HYPERTENSION (ICD-401.9) stable  Other Orders: CPX Test at [facility] (CPX Test)  Patient Instructions: 1)  Your physician recommends that you schedule a follow-up appointment with Dr Gala Romney after CPX test 2)  Your physician has recommended that you have a cardiopulmonary stress test (CPX).  CPX testing is a non-invasive measurement of heart and lung function. It replaces a traditional treadmill stress test. This type of test provides a tremendous amount of information that relates not only to your present condition but also for future outcomes.  This test combines measurements of your ventilation, respiratory gas exchange in the lungs, electrocardiogram (EKG), blood pressure and physical response before, during, and following an exercise protocol.

## 2010-11-22 NOTE — Miscellaneous (Signed)
Summary: Lec previsit  Clinical Lists Changes  Observations: Added new observation of ALLERGY REV: Done (12/14/2009 13:53)

## 2010-11-22 NOTE — Consult Note (Signed)
Summary: [facility]  [facility]   Imported By: Lanelle Bal 09/08/2010 11:43:40  _____________________________________________________________________  External Attachment:    Type:   Image     Comment:   External Document

## 2010-11-22 NOTE — Assessment & Plan Note (Signed)
Summary: refill mouth wash.cbs--Rm 13   Vital Signs:  Patient profile:   76 year old female Height:      65 inches Weight:      188 pounds BMI:     31.40 Temp:     97.5 degrees F oral Pulse rate:   90 / minute Pulse rhythm:   regular Resp:     16 per minute BP sitting:   140 / 86  (right arm) Cuff size:   regular  Vitals Entered By: Mervin Kung CMA (April 15, 2010 10:33 AM) CC: Room 13   Follow up to refill Magic Mouthwash. Potassium elevated at Cardiologist (Dr. Johney Frame) yesterday.  Has head congestion for months and now has bloody nasal discharge.  Pt is fasting. Is Patient Diabetic? Yes Comments Pt agrees all medication doses and directtions are correct.   History of Present Illness: here w/ the following problems:  mouth symptoms since last summer, burning and dryness on-off  Magic Mouthwash helped some   saw Dr Charlett Blake ref ankle problems, she rec the same as Dr Lajoyce Corners -- no ankle surgery due to h/o a "fungal infection in the area" years ago she also rec. to  increase vicodin to 10mg  (per pt) as the 7.5 mg is not quite taking care of the pain  Has head congestion for months and now has bloody nasal discharge since this AM   ROS no fever cough-- some  some PND  itchy eyes, nose: little  no chest congestion , no sputum perse      Allergies: 1)  ! Talwin 2)  ! Methadone Hcl 3)  ! Tranxene-T 4)  ! * ? Antibiotics 5)  ! * Cefazolin 6)  ! Doxycycline 7)  ! * Enablex 8)  ! * Dilaudid 9)  ! Adhesive Tape  Past History:  Past Medical History: PE and DVT 12-2007, after neck surgery, CT chest 10-09 neg for PE , coumadin d/c 10-2008 CP 06-2008: admited, r/o for MI, ECHO normal; symptoms likely GI stress test abnormal 2007, but  normal 03-2009 HYPERTENSION  HYPERLIPIDEMIA GERD, HH , Esoph. Stricture DIABETES MELLITUS ?asthma (SOB when attempted  to d/c singulair 11-2008) Pulmonary Nodule , incidental per CT: PET scan 4-9: likely  benign, CT 08-2009 no change, no  further CTs( Dr Shelle Iron) REFLEX SYMPATHETIC DYSTROPHY  and Neuropathy Osteoporosis Hemorrhoids h/o IBS h/o colon polyps and DIVERTICULOSIS h/ o +PPD HX of Malignant Colon Polyp in 1994, colonoscopy again 8-10, had a biopsy. Next 2015 Dx w/  early macular degeneration June 2010 (Dr Earlene Plater) Orthostatic intolerance Allergic rhinitis  Past Surgical History: Cholecystectomy Hysterectomy w/o bilateral salpingo-oophorectomy hx of cataract sx  rotator cuff surgery 10-08 Dr Eulah Pont Cspine surgery for OA --fussion, Dr Ophelia Charter (12-18-07) (f/u by a PE) 3th R toe amputation d/t osteomyelitis 11-09 Dr Lajoyce Corners  hiatal hernia surgery, Nissen Fundaplication---(01/25/10)  Social History: Reviewed history from 07/17/2008 and no changes required. Divorced daughter lives with her pt has children. pt is retired from Engineer, site.  Patient has never smoked.  Alcohol Use - no Illicit Drug Use - no  Physical Exam  General:  alert and well-developed.   Head:  face symmetric, nontender to palpation Ears:  R ear normal and L ear normal.   Nose:  slightly congested Mouth:  inspection of the lips, tongue, gums show no rash, dryness or discoloration Lungs:  normal respiratory effort, no intercostal retractions, and no accessory muscle use.   Heart:  normal rate, regular rhythm, and no murmur.  Impression & Recommendations:  Problem # 1:  GLOSSITIS (ICD-529.0) continue with on and off burning type feeling in the mouth and lips Historically magic mouthwash helped She discontinued Elavil hoping that that would help but it didn't. She is considering restart Elavil. She does see her dentist every 6 months Plan: Refill Magic mouthwash  Problem # 2:  ALLERGIC RHINITIS (ICD-477.9) see history of present illness, some of her symptoms are likely allergies Start veramyst   Her updated medication list for this problem includes:    Veramyst 27.5 Mcg/spray Susp (Fluticasone furoate) .Marland Kitchen... 2 sprays on each side  of the nose once daily  Problem # 3:  DEGENERATIVE JOINT DISEASE (ICD-715.90) see history of present illness, Vicodin 7.5 no taking care of the pain as well as she likes Increase Vicodin to 10 mg. she just got a prescription for Vicodin 7.5 few days ago, a new prescription for Vicodin 10 mg issued  She should have enough pain  medication for the next 7 weeks Her updated medication list for this problem includes:    Low-dose Aspirin 81 Mg Tabs (Aspirin) .Marland Kitchen... Take 1 tablet by mouth once a day    Vicodin Hp 10-660 Mg Tabs (Hydrocodone-acetaminophen) ..... One by mouth every 4 hours as needed for pain  Complete Medication List: 1)  Amlodipine Besy-benazepril Hcl 5-10 Mg Caps (Amlodipine besy-benazepril hcl) .... One by mouth daily 2)  Meclizine Hcl 25 Mg Tabs (Meclizine hcl) .Marland Kitchen.. 1 by mouth as needed dizzy 3)  Low-dose Aspirin 81 Mg Tabs (Aspirin) .... Take 1 tablet by mouth once a day 4)  Glucometer and Suplies  .... Check once a day 5)  Freestyle Lite Strp (Glucose blood) .... Check bs two times a day dx 250.00 6)  Freestyle Lancets Misc (Lancets) .... Use two times a day dx 250.00 7)  Magnesium Powder  .Marland Kitchen.. 3 tsp once daily 8)  Trivita Nerve Formula  .... Take by mouth daily 9)  Eye Vitamin  10)  Lutein  11)  Voltaren Gel  .... Apply as needed to ankle per patient 12)  Vitamin D 2000 Unit Tabs (Cholecalciferol) .... 3 capsules daily 13)  Calcium  14)  Boron  15)  Vicodin Hp 10-660 Mg Tabs (Hydrocodone-acetaminophen) .... One by mouth every 4 hours as needed for pain 16)  Fludrocortisone Acetate 0.1 Mg Tabs (Fludrocortisone acetate) .... One by mouth daily 17)  Magic Mouth Wash  .... 5 cc every 6 hours as needed 18)  Veramyst 27.5 Mcg/spray Susp (Fluticasone furoate) .... 2 sprays on each side of the nose once daily  Patient Instructions: 1)  Please schedule a follow-up appointment in 3 months .  2)  veramyst daily, if no better in 2 or 3 weeks let me know Prescriptions: VERAMYST  27.5 MCG/SPRAY SUSP (FLUTICASONE FUROATE) 2 sprays on each side of the nose once daily  #1 x 6   Entered and Authorized by:   [name] E. Tahisha Hakim MD   Signed by:   Nolon Rod. Winona Sison MD on 04/15/2010   Method used:   Print then Give to Patient   RxID:   [id number] MAGIC MOUTH WASH 5 cc every 6 hours as needed  #1 month x 3   Entered and Authorized by:   [employer] E. Hagan Vanauken MD   Signed by:   Nolon Rod. Greyden Besecker MD on 04/15/2010   Method used:   Print then Give to Patient   RxID:   [id number] VICODIN HP 10-660 MG TABS (HYDROCODONE-ACETAMINOPHEN) one by mouth every  4 hours as needed for pain  #120 x 0   Entered and Authorized by:   Nolon Rod. Logan Vegh MD   Signed by:   Nolon Rod. Tuvia Woodrick MD on 04/15/2010   Method used:   Print then Give to Patient   RxID:   [id number]   Current Allergies (reviewed today): ! TALWIN ! METHADONE HCL ! TRANXENE-T ! * ? ANTIBIOTICS ! * CEFAZOLIN ! DOXYCYCLINE ! * ENABLEX ! * DILAUDID ! ADHESIVE TAPE

## 2010-11-22 NOTE — Assessment & Plan Note (Signed)
Summary: acute sick visit for dyspnea   Copy to:  n/a Primary Provider/Referring Provider:  [name], MD  CC:  Pt is here for an acute sick visit.  Pt c/o increased sob with exertion x 5 days.  Pt states she went to ER on 12-07-2009 for increased sob and was told to f/u with Dr. Shelle Iron.  Pt states o2 sats drop with exertion.  Pt also c/o occ non-productive cough. Pt also c/o occ L sided chest tenderness.  Pt states she has large HH and wonders if this is causing her sob.  Marland Kitchen  History of Present Illness: The pt comes in today for an acute sick visit related to dyspnea.  This has been an ongoing problem for her, and has had a negative pulmonary w/u to date. This has including normal spirometry, no pulmonary emboli on v/q, unrevealing chest xray and ct chest, and an echo showing only diastolic dysfunction.  She  went to ER recently with sob, and the records show no acute process, and she was asked to f/u here.   She has a dry cough, and notes atypical chest pain on left that sounds more msk than anything else.  She does have a h/o H/H, and states that her sob gets worse after eating.  Current Medications (verified): 1)  Meclizine Hcl 25 Mg Tabs (Meclizine Hcl) .Marland Kitchen.. 1 By Mouth As Needed Dizzy 2)  Celebrex 200 Mg Caps (Celecoxib) .Marland Kitchen.. 1 By Mouth Once Daily 3)  Nexium 40 Mg Cpdr (Esomeprazole Magnesium) .... Take 1 Tablet By Mouth Two Times A Day 4)  Allegra 180 Mg Tabs (Fexofenadine Hcl) .Marland Kitchen.. 1 By Mouth Once Daily 5)  Low-Dose Aspirin 81 Mg  Tabs (Aspirin) .... Take 1 Tablet By Mouth Once A Day 6)  Glucometer and Suplies .... Check Once A Day 7)  Freestyle Lite   Strp (Glucose Blood) .... Check Bs Two Times A Day Dx 250.00 8)  Freestyle Lancets   Misc (Lancets) .... Use Two Times A Day Dx 250.00 9)  Calcium + D 600-200 Mg-Unit Tabs (Calcium Carbonate-Vitamin D) .... Take 3 Tabs By Mouth Daily 10)  Boron 3mg  .... 3 By Mouth Qd 11)  Magnesium Powder .Marland Kitchen.. 3 Tsp Once Daily 12)  Trivita Nerve Formula ....  Take By Mouth Daily 13)  Singulair 10 Mg Tabs (Montelukast Sodium) .Marland Kitchen.. 1 By Mouth Once Daily 14)  Mvi 15)  Eye Vitamin 16)  Lutein 17)  Vitamin D3 10000 Unit Caps (Cholecalciferol) .... 3 Caps By Mouth Daily 18)  Amlodipine Besy-Benazepril Hcl 5-10 Mg Caps (Amlodipine Besy-Benazepril Hcl) .... One By Mouth Daily 19)  Ultram 50 Mg Tabs (Tramadol Hcl) .... 2 By Mouth Three Times A Day 20)  Voltaren Gel .... Apply As Needed To Ankle Per Patient 21)  Carafate 1 Gm/22ml Susp (Sucralfate) .Marland Kitchen.. 1 Gm Two Times A Day  Allergies (verified): 1)  ! Talwin 2)  ! Methadone Hcl 3)  ! Tranxene-T 4)  ! * ? Antibiotics 5)  ! * Cefazolin 6)  ! Doxycycline 7)  ! * Enablex 8)  ! Adhesive Tape  Review of Systems       The patient complains of shortness of breath with activity, non-productive cough, and nasal congestion/difficulty breathing through nose.  The patient denies shortness of breath at rest, productive cough, coughing up blood, chest pain, irregular heartbeats, acid heartburn, indigestion, loss of appetite, weight change, abdominal pain, difficulty swallowing, sore throat, tooth/dental problems, headaches, sneezing, itching, ear ache, anxiety, depression, hand/feet swelling,  joint stiffness or pain, rash, change in color of mucus, and fever.    Vital Signs:  Patient profile:   76 year old female Height:      65 inches Weight:      191.38 pounds BMI:     31.96 O2 Sat:      99 % on Room air Temp:     97.7 degrees F oral Pulse rate:   99 / minute BP sitting:   128 / 80  (left arm) Cuff size:   regular  Vitals Entered By: Arman Filter LPN (December 09, 2009 9:45 AM)  O2 Flow:  Room air CC: Pt is here for an acute sick visit.  Pt c/o increased sob with exertion x 5 days.  Pt states she went to ER on 12-07-2009 for increased sob and was told to f/u with Dr. Shelle Iron.  Pt states o2 sats drop with exertion.  Pt also c/o occ non-productive cough. Pt also c/o occ L sided chest tenderness.  Pt  states she has large HH and wonders if this is causing her sob.   Comments Medications reviewed with patient Arman Filter LPN  December 09, 2009 9:52 AM     Physical Exam  General:  ow female in nad Lungs:  totally clear to auscultation Heart:  rrr Extremities:  minimal LE edema Neurologic:  alert and oriented, moves all 4.   Impression & Recommendations:  Problem # 1:  DYSPNEA (ICD-786.05)  the pt had an episode of dyspnea last night and went to the ER for evaluation.  Her cxr was negative, and her sat were normal.  She has had an extensive pulmonary w/u, with normal spirometry, unrevealing ct chest, and a normal v/q scan.  I do not think her lungs are the source of her sob.  She has also had an echo showing diastolic dysfunction.  She has an apptm with Dr. Johney Frame next week, and I will look forward to his thoughts regarding possible cardiac etiology for her symptoms.  ?whether she needs right and left heart cath?  I will leave that to his discretion.  Other Orders: Est. Patient Level III (16109)  Patient Instructions: 1)  keep your apptm with Dr. Johney Frame for next week.  Will work with him to see if we can figure out a cause for your sob.

## 2010-11-22 NOTE — Assessment & Plan Note (Signed)
Summary: rov/DOE    Visit Type:  Follow-up Referring Provider:  n/a Primary Provider:  [name], MD  CC:  SOB.  History of Present Illness: Mary  Gould presents today for cardiology evaluation of dypsnea.  She was previously seen by me 2009 for orthostatic dizziness, which has resolved.  She reports that over the past few months she has had progressive symptoms of shortness of breath.  She reports initially noticing SOB with eating.  This SOB has progressed to SOB with minimal ambulation.  She reports that recently she develops upper back pain with minimal activity such as washing dishes.  She reports that her SOB and pain abate once she sits down.  She now uses a wheel chair due to extreme dypsnea.  She denies orthopnea, PND, edema, presyncope, or syncope.  She has been seen by Dr Shelle Iron who feels that her symptoms are not primarily due to respiratory failure.  She has had normal spirometry per his recent note (I cannot find this report).  She also had a recent VQ scan in December which was unremarkable. Her TTE previously showed a preserved EF with nl PA pressures and RV function.  She also had an adenosine myoview in June 2010 which was normal. Her most recent GI swallow series reveals an esophageal stricture.  Current Medications (verified): 1)  Meclizine Hcl 25 Mg Tabs (Meclizine Hcl) .Marland Kitchen.. 1 By Mouth As Needed Dizzy 2)  Celebrex 200 Mg Caps (Celecoxib) .Marland Kitchen.. 1 By Mouth Once Daily 3)  Nexium 40 Mg Cpdr (Esomeprazole Magnesium) .... Take 1 Tablet By Mouth Two Times A Day 4)  Allegra 180 Mg Tabs (Fexofenadine Hcl) .Marland Kitchen.. 1 By Mouth Once Daily 5)  Low-Dose Aspirin 81 Mg  Tabs (Aspirin) .... Take 1 Tablet By Mouth Once A Day 6)  Glucometer and Suplies .... Check Once A Day 7)  Freestyle Lite   Strp (Glucose Blood) .... Check Bs Two Times A Day Dx 250.00 8)  Freestyle Lancets   Misc (Lancets) .... Use Two Times A Day Dx 250.00 9)  Calcium + D 600-200 Mg-Unit Tabs (Calcium Carbonate-Vitamin D)  .... Take 3 Tabs By Mouth Daily 10)  Boron 3mg  .... 3 By Mouth Qd 11)  Magnesium Powder .Marland Kitchen.. 3 Tsp Once Daily 12)  Trivita Nerve Formula .... Take By Mouth Daily 13)  Singulair 10 Mg Tabs (Montelukast Sodium) .Marland Kitchen.. 1 By Mouth Once Daily 14)  Mvi 15)  Eye Vitamin 16)  Lutein 17)  Vitamin D3 10000 Unit Caps (Cholecalciferol) .... 3 Caps By Mouth Daily 18)  Amlodipine Besy-Benazepril Hcl 5-10 Mg Caps (Amlodipine Besy-Benazepril Hcl) .... One By Mouth Daily 19)  Ultram 50 Mg Tabs (Tramadol Hcl) .... 2 By Mouth Three Times A Day 20)  Voltaren Gel .... Apply As Needed To Ankle Per Patient 21)  Carafate 1 Gm/42ml Susp (Sucralfate) .Marland Kitchen.. 1 Gm Two Times A Day  Allergies: 1)  ! Talwin 2)  ! Methadone Hcl 3)  ! Tranxene-T 4)  ! * ? Antibiotics 5)  ! * Cefazolin 6)  ! Doxycycline 7)  ! * Enablex 8)  ! Adhesive Tape  Past History:  Past Medical History: Reviewed history from 09/23/2009 and no changes required. PE and DVT 12-2007, after neck surgery, CT chest 10-09 neg for PE , coumadin d/c 10-2008 CP 06-2008: admited, r/o for MI, ECHO normal; symptoms likely GI stress test abnormal 2007, but  normal 03-2009 HYPERTENSION  HYPERLIPIDEMIA GERD, HH , Esoph. Stricture DIABETES MELLITUS ?asthma (SOB when attempted  to  d/c singulair 11-2008) Pulmonary Nodule , incidental per CT: PET scan 4-9: likely  benign; redo CT 4-10 (Saw Dr Shelle Iron) REFLEX SYMPATHETIC DYSTROPHY  and Neuropathy Osteoporosis Hemorrhoids h/o IBS h/o colon polyps and DIVERTICULOSIS h/ o +PPD HX of Malignant Colon Polyp in 1994 schedule for a Cscope August 2010 Dx w/  early macular degeneration June 2010 (Dr Earlene Plater)  Past Surgical History: Reviewed history from 04/22/2009 and no changes required. Cholecystectomy Hysterectomy w/o bilateral salpingo-oophorectomy hx of cataract sx  rotator cuff surgery 10-08 Dr Eulah Pont Cspine surgery for OA --fussion, Dr Ophelia Charter (12-18-07) (f/u by a PE) 3th R toe amputation d/t osteomyelitis 11-09  Dr Lajoyce Corners   Family History: Reviewed history from 09/29/2008 and no changes required. Family Hsitory Headaches Family History Hypertension heart disease: mother (mitral valve replaced) rheumatism: mother No FH of Colon Cancer:  Social History: Reviewed history from 07/17/2008 and no changes required. Divorced daughter lives with her pt has children. pt is retired from Engineer, site.  Patient has never smoked.  Alcohol Use - no Illicit Drug Use - no  Review of Systems       All systems are reviewed and negative except as listed in the HPI.   Vital Signs:  Patient profile:   76 year old female Height:      65 inches Weight:      191 pounds BMI:     31.90 Pulse rate:   93 / minute BP sitting:   120 / 80  (left arm)  Vitals Entered By: Laurance Flatten CMA (December 13, 2009 2:27 PM)  Physical Exam  General:  Well developed, well nourished, in no acute distress. Head:  normocephalic and atraumatic Eyes:  PERRLA/EOM intact; conjunctiva and lids normal. Mouth:  Teeth, gums and palate normal. Oral mucosa normal. Neck:  Neck supple, no JVD. No masses, thyromegaly or abnormal cervical nodes. Lungs:  Clear bilaterally to auscultation and percussion. Heart:  Non-displaced PMI, chest non-tender; regular rate and rhythm, S1, S2 without murmurs, rubs or gallops. Carotid upstroke normal, no bruit. Normal abdominal aortic size, no bruits. Femorals normal pulses, no bruits. Pedals normal pulses. No edema, no varicosities. Abdomen:  Bowel sounds positive; abdomen soft and non-tender without masses, organomegaly, or hernias noted. No hepatosplenomegaly. Msk:  Back normal, normal gait. Muscle strength and tone normal. Pulses:  pulses normal in all 4 extremities Extremities:  No clubbing or cyanosis. Neurologic:  Alert and oriented x 3. Skin:  Intact without lesions or rashes. Cervical Nodes:  no significant adenopathy Psych:  Normal affect. Additional Exam:  upon ambulation, sats are 97%  at rest.  With ambulation 20 ft, she became subjectively very dypsneic, she has increased heart rate (98bpm-->116 bpm) though her sats remain 100%   EKG  Procedure date:  12/13/2009  Findings:      sinus rhythm 93 bpm, LVH, otherwise normal ekg  Impression & Recommendations:  Problem # 1:  DYSPNEA (ICD-786.05) The patient presents for further evaluation of progressive dypsnea.  On my history, she reports initially having dypsnea with eating.  She reports initially telling her daughter that she was going to "have to stop eating".  Since that time, her dypsnea has increased.  Presently, she reports dypsnea with minimal activities and has limited her mobility.  Her sats with ambulation today remain 100%, though she becomes subjectively very dypsneic.  She has been discovered to have a significant distal esophageal stricture. Her prior cardiac and pulmonary workups have been normal.  She has had a recent VQ scan  which does not support recurrent PTE as the cause. I have spoke with Dr Diamond Nickel who plans esophageal dilatation.  I will see her in 3 weeks to assess SOB after dilatation.  If chronic aspiration is the cause for her symptoms, then she should improve.  If she remains significantly dyspneic, then we will do further cardiac testing.  ( We could consider RHC/LHC vs CPX testing). On exam today, she does not appear to have volume overload. A cardiac cause for her symptoms seems less likely given nl echo and nuclear studies, however we will follow her closely.  She will need to go to the ER if dypsnea worsens.  Problem # 2:  HYPERTENSION (ICD-401.9) stable no change Her updated medication list for this problem includes:    Low-dose Aspirin 81 Mg Tabs (Aspirin) .Marland Kitchen... Take 1 tablet by mouth once a day    Amlodipine Besy-benazepril Hcl 5-10 Mg Caps (Amlodipine besy-benazepril hcl) ..... One by mouth daily  Problem # 3:  HIATAL HERNIA (ICD-553.3) Gi assessment as above Her updated medication  list for this problem includes:    Nexium 40 Mg Cpdr (Esomeprazole magnesium) .Marland Kitchen... Take 1 tablet by mouth two times a day    Carafate 1 Gm/94ml Susp (Sucralfate) .Marland Kitchen... 1 gm two times a day  Problem # 4:  HYPERLIPIDEMIA (ICD-272.4) stable  Other Orders: EKG w/ Interpretation (93000)

## 2010-11-22 NOTE — Miscellaneous (Signed)
Summary: nexium refill  Clinical Lists Changes  Medications: Changed medication from NEXIUM 40 MG CPDR (ESOMEPRAZOLE MAGNESIUM) Take 1 tablet by mouth two times a day to NEXIUM 40 MG CPDR (ESOMEPRAZOLE MAGNESIUM) Take 1 tablet by mouth two times a day - Signed Rx of NEXIUM 40 MG CPDR (ESOMEPRAZOLE MAGNESIUM) Take 1 tablet by mouth two times a day;  #60 x 5;  Signed;  Entered by: Hortense Ramal CMA;  Authorized by: Hart Carwin MD;  Method used: Electronically to [id number]*, [address], [place], [place], [place]  [address], Ph: [phone], Fax: [phone]    Prescriptions: NEXIUM 40 MG CPDR (ESOMEPRAZOLE MAGNESIUM) Take 1 tablet by mouth two times a day  #60 x 5   Entered by:   Hortense Ramal CMA   Authorized by:   Hart Carwin MD   Signed by:   Hortense Ramal CMA on 08/05/2008   Method used:   Electronically to        [facility]. [phone]* (retail)       [address]       [place], [place]  [address]       Ph: [phone]       Fax: [phone]   RxID:   [id number]

## 2010-11-22 NOTE — Assessment & Plan Note (Signed)
Summary: per check out/pt was seen by md in hosp/saf      Allergies Added:   Visit Type:  Initial Consult Referring Shaylinn Hladik:  Dr Johney Frame Primary Esterlene Atiyeh:  [name], MD  CC:  shortness of breath.  History of Present Illness: [name] is a 76 y/o woman with h/o HTN, HL, orthostatic hypotension and reflex sympathetic dystrophy. Referred by Dr. Johney Frame for further evaluation of progressive dyspnea.   Says she had neck surgery in February 2009 and since that time has been getting SOB. Initially found to have pulmonary emboli and treated with coumadin for 1 year. Developed progressive SOB.  Initially only experienced it while standing. But now notes it is getting worse and is also getting it with exertion. Can barely walk far without stopping and feels SOB almost at all times. No problems while lying down. No cough, orthopnea or PND. Never smoked. Denies muscle wasting or fasciultions. No limb girdle weakness.  Has had extensive work. Initially saw Dr. Shelle Iron at the end of 2010. Had VQ which was negative for recurrent emboli.  CT of chest showed small lung nodule but no signifcant parenchymal disease.  Then had echo 12/10 with EF 65% and grade I diastolic dysfx. No signifcant valvular disease.  In 4/11 found to have moderate to large hiatal hernia with stomach protruding into chest. Had that repaired with no improvement in symptoms.  Referred to Dr. Johney Frame who had concern for possible CAD or platypnea/orthodeoxia. 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 RHC normal with RA 5 RV 31/2 PA 27/12 (19) PCW 10 CO normal. No evidence of shunting with sitting up in cath lab. TEE EF 60% trivial MR. Redundant atrial septum. No PFO. Bubble negative.   CPX this week: Resting spriometry normal. pVO2 15.2 (95% predicted) adjusts to 20.7 when corrected for ideal body weight. ve/vco2 slope markedly elevated at 46. O2 pulse 100%. VE/MVV 56%. Sats 97% throughot exercise Felt to be mostly a ventilatory  limitation. Cannot exclude concomittant circulatory limitation  Problems Prior to Update: 1)  Allergic Rhinitis  (ICD-477.9) 2)  Glossitis  (ICD-529.0) 3)  Hyperpotassemia  (ICD-276.7) 4)  Orthostatic Dizziness  (ICD-780.4) 5)  Hyperlipidemia  (ICD-272.4) 6)  Hypertension  (ICD-401.9) 7)  Hiatal Hernia  (ICD-553.3) 8)  Gastric Ulcer, Hx of  (ICD-V12.71) 9)  Diabetes Mellitus, Type II  (ICD-250.00) 10)  Gerd  (ICD-530.81) 11)  Osteoporosis  (ICD-733.00) 12)  Reflex Sympathetic Dystrophy  (ICD-337.20) 13)  Nausea  (ICD-787.02) 14)  Degenerative Joint Disease  (ICD-715.90) 15)  ? of Chronic Osteomyelitis Ankle and Foot  (ICD-730.17) 16)  Dyspnea  (ICD-786.05) 17)  Uti's, Recurrent  (ICD-599.0) 18)  Edema  (ICD-782.3) 19)  Irritable Bowel Syndrome, Hx of  (ICD-V12.79) 20)  Diverticulosis, Colon  (ICD-562.10) 21)  Colonic Polyps  (ICD-211.3) 22)  Pulmonary Nodule  (ICD-518.89) 23)  Pe  (ICD-415.19) 24)  Myocardial Perfusion Scan, With Stress Test, Abnormal  (ICD-794.39) 25)  Health Maintenance Exam  (ICD-V70.0) 26)  Tb Skin Test, Positive  (ICD-795.5)  Medications Prior to Update: 1)  Amlodipine Besy-Benazepril Hcl 5-10 Mg Caps (Amlodipine Besy-Benazepril Hcl) .... One By Mouth Daily 2)  Meclizine Hcl 25 Mg Tabs (Meclizine Hcl) .Marland Kitchen.. 1 By Mouth As Needed Dizzy 3)  Low-Dose Aspirin 81 Mg  Tabs (Aspirin) .... Take 1 Tablet By Mouth Once A Day 4)  Glucometer and Suplies .... Check Once A Day 5)  Freestyle Lite   Strp (Glucose Blood) .... Check Bs Two Times A Day Dx 250.00 6)  Freestyle Lancets   Misc (Lancets) .... Use Two Times A Day Dx 250.00 7)  Magnesium Powder .Marland Kitchen.. 3 Tsp Once Daily 8)  Trivita Nerve Formula .... Take By Mouth Daily 9)  Eye Vitamin 10)  Lutein 11)  Voltaren Gel .... Apply As Needed To Ankle Per Patient 12)  Vitamin D 2000 Unit Tabs (Cholecalciferol) .... 3 Capsules Daily 13)  Calcium 14)  Boron 15)  Vicodin Hp 10-660 Mg Tabs (Hydrocodone-Acetaminophen) ....  One By Mouth Every 4 Hours As Needed For Pain 16)  Fludrocortisone Acetate 0.1 Mg Tabs (Fludrocortisone Acetate) .... One By Mouth Daily 17)  Magic Mouth Wash .... 5 Cc Every 6 Hours As Needed 18)  Veramyst 27.5 Mcg/spray Susp (Fluticasone Furoate) .... 2 Sprays On Each Side of The Nose Once Daily  Current Medications (verified): 1)  Amlodipine Besy-Benazepril Hcl 5-10 Mg Caps (Amlodipine Besy-Benazepril Hcl) .... One By Mouth Daily 2)  Meclizine Hcl 25 Mg Tabs (Meclizine Hcl) .Marland Kitchen.. 1 By Mouth As Needed Dizzy 3)  Low-Dose Aspirin 81 Mg  Tabs (Aspirin) .... Take 1 Tablet By Mouth Once A Day 4)  Glucometer and Suplies .... Check Once A Day 5)  Freestyle Lite   Strp (Glucose Blood) .... Check Bs Two Times A Day Dx 250.00 6)  Freestyle Lancets   Misc (Lancets) .... Use Two Times A Day Dx 250.00 7)  Magnesium Powder .Marland Kitchen.. 3 Tsp Once Daily 8)  Trivita Nerve Formula .... Take By Mouth Daily 9)  Lutein 10)  Voltaren Gel .... Apply As Needed To Ankle Per Patient 11)  Vitamin D 2000 Unit Tabs (Cholecalciferol) .... 3 Capsules Daily 12)  Calcium 13)  Boron 14)  Vicodin Hp 10-660 Mg Tabs (Hydrocodone-Acetaminophen) .... One By Mouth Every 4 Hours As Needed For Pain 15)  Fludrocortisone Acetate 0.1 Mg Tabs (Fludrocortisone Acetate) .... One By Mouth Daily 16)  Magic Mouth Wash .... 5 Cc Every 6 Hours As Needed 17)  Veramyst 27.5 Mcg/spray Susp (Fluticasone Furoate) .... 2 Sprays On Each Side of The Nose Once Daily  Allergies (verified): 1)  ! Talwin 2)  ! Methadone Hcl 3)  ! Tranxene-T 4)  ! * ? Antibiotics 5)  ! * Cefazolin 6)  ! Doxycycline 7)  ! * Enablex 8)  ! * Dilaudid 9)  ! Adhesive Tape  Past History:  Past Medical History: Last updated: 05/20/2010 PE and DVT 12-2007, after neck surgery, CT chest 10-09 neg for PE , coumadin d/c 10-2008 CP 06-2008: admited, r/o for MI, ECHO normal; symptoms likely GI stress test abnormal 2007, but  normal 03-2009 HYPERTENSION  HYPERLIPIDEMIA Large  hiatal hernia repair 01/25/10 DIABETES MELLITUS ?asthma (SOB when attempted  to d/c singulair 11-2008) Pulmonary Nodule , incidental per CT: PET scan 4-9: likely  benign, CT 08-2009 no change, no further CTs( Dr Shelle Iron) REFLEX SYMPATHETIC DYSTROPHY  and Neuropathy Osteoporosis Hemorrhoids h/o IBS h/o colon polyps and DIVERTICULOSIS h/ o +PPD HX of Malignant Colon Polyp in 1994, colonoscopy again 8-10, had a biopsy. Next 2015 Dx w/  early macular degeneration June 2010 (Dr Earlene Plater) Orthostatic intolerance Allergic rhinitis  Past Surgical History: Last updated: 04/15/2010 Cholecystectomy Hysterectomy w/o bilateral salpingo-oophorectomy hx of cataract sx  rotator cuff surgery 10-08 Dr Eulah Pont Cspine surgery for OA --fussion, Dr Ophelia Charter (12-18-07) (f/u by a PE) 3th R toe amputation d/t osteomyelitis 11-09 Dr Lajoyce Corners  hiatal hernia surgery, [facility] Funda[facility]---(01/25/10)  Family History: Last updated: 09/29/2008 Family Hsitory Headaches Family History Hypertension heart disease: mother (mitral valve replaced) rheumatism:  mother No FH of Colon Cancer:  Social History: Last updated: 07/17/2008 Divorced daughter lives with her pt has children. pt is retired from Engineer, site.  Patient has never smoked.  Alcohol Use - no Illicit Drug Use - no  Risk Factors: Smoking Status: never (07/17/2008)  Family History: Reviewed history from 09/29/2008 and no changes required. Family Hsitory Headaches Family History Hypertension heart disease: mother (mitral valve replaced) rheumatism: mother No FH of Colon Cancer:  Social History: Reviewed history from 07/17/2008 and no changes required. Divorced daughter lives with her pt has children. pt is retired from Engineer, site.  Patient has never smoked.  Alcohol Use - no Illicit Drug Use - no  Review of Systems       As per HPI and past medical history; otherwise all systems negative.   Vital Signs:  Patient profile:   76 year  old female Height:      65 inches Weight:      194 pounds BMI:     32.40 Pulse rate:   80 / minute BP sitting:   142 / 88  (left arm) Cuff size:   regular  Vitals Entered By: Hardin Negus, RMA (June 30, 2010 10:40 AM)  Physical Exam  General:  elderly. obese woman walks with cane. no resp difficulty HEENT: normal Neck: supple. no JVD. Carotids 2+ bilat; no bruits. No lymphadenopathy or thryomegaly appreciated. Cor: PMI nondisplaced. Regular rate & rhythm. No rubs, gallops, murmur. Lungs: clear Abdomen: obese. soft, nontender, nondistended. No bruits or masses. Good bowel sounds. Extremities: no cyanosis, clubbing, rash, tr edema Neuro: alert & orientedx3, cranial nerves grossly intact. moves all 4 extremities w/o difficulty. strength normal in lower extremities. affect pleasant    Impression & Recommendations:  Problem # 1:  DYSPNEA (ICD-786.05) Complicated case. She has had an exhaustive work-up without a clear underlying cardiac or pulmonary cause. Currently the only thing I think we have to hang our hat on is that her ventilatory response to exercise seems quite abnormal. Whether this is psychogenic or due to respiratory mucle weakness from neuromuscular causes or surgical injury is also unclear. I have spoke to Dr. Sandria Manly in neurology who will fax [place] a panel of labwork to screen for neuromuscular disorders.  For now our plan will be: 1) Check neuromuscular labs 2) Refer to radiology to fluoro diaphragms 3) Repeat PFTs with MIP and MEP (maximal inspiratory and expiratory pressures) 4) Refer to pulmonary rehab for exercise and breathing retraining  If this workup unrevelaing and symptoms persist consider referral to Dr. Lorane Gell in pulmonary at [facility] for a 2nd opinion.   Other Orders: Radiology Referral (Radiology) Pulmonary Function Test (PFT)  Patient Instructions: 1)  Your physician has recommended that you have a pulmonary function test.  Pulmonary Function Tests  are a group of tests that measure how well air moves in and out of your lungs. 2)  You have been referred to Radiogology  3)  You have been referred to Pulmonary rehab, we send them the information and they will contact you to schedule class time. 4)  Follow up in 1-2 months

## 2010-11-22 NOTE — Assessment & Plan Note (Signed)
Summary: 3 MONTH OV//PH   Vital Signs:  Patient profile:   76 year old female Height:      65 inches Weight:      181 pounds BMI:     30.23 Pulse rate:   86 / minute BP sitting:   124 / 86  Vitals Entered By: Shary Decamp (Mar 11, 2010 2:17 PM)  CC: rov Comments  - FBS @ home 119-130  - patient would like to change ultram to vicodin -- no relief with vicodin .Marland KitchenMarland KitchenMarland KitchenShary Decamp  Mar 11, 2010 2:22 PM    History of Present Illness: routine office visit, we discussed several issues today She had a successful repair of a HH last month  Current Medications (verified): 1)  Meclizine Hcl 25 Mg Tabs (Meclizine Hcl) .Marland Kitchen.. 1 By Mouth As Needed Dizzy 2)  Low-Dose Aspirin 81 Mg  Tabs (Aspirin) .... Take 1 Tablet By Mouth Once A Day 3)  Glucometer and Suplies .... Check Once A Day 4)  Freestyle Lite   Strp (Glucose Blood) .... Check Bs Two Times A Day Dx 250.00 5)  Freestyle Lancets   Misc (Lancets) .... Use Two Times A Day Dx 250.00 6)  Magnesium Powder .Marland Kitchen.. 3 Tsp Once Daily 7)  Trivita Nerve Formula .... Take By Mouth Daily 8)  Eye Vitamin 9)  Lutein 10)  Amlodipine Besy-Benazepril Hcl 5-10 Mg Caps (Amlodipine Besy-Benazepril Hcl) .... One By Mouth Daily 11)  Voltaren Gel .... Apply As Needed To Ankle Per Patient 12)  Percocet 5-325 Mg Tabs (Oxycodone-Acetaminophen) .... Not Working 13)  Vitamin D 2000 Unit Tabs (Cholecalciferol) .... 3 Capsules Daily 14)  Ultram 50 Mg Tabs (Tramadol Hcl) .... Not Working 15)  Celebrex 200 Mg Caps (Celecoxib) .Marland Kitchen.. 1 By Mouth Once Daily 16)  Allegra 180 Mg Tabs (Fexofenadine Hcl) .Marland Kitchen.. 1 By Mouth Once Daily 17)  Calcium 18)  Boron  Allergies: 1)  ! Talwin 2)  ! Methadone Hcl 3)  ! Tranxene-T 4)  ! * ? Antibiotics 5)  ! * Cefazolin 6)  ! Doxycycline 7)  ! * Enablex 8)  ! * Dilaudid 9)  ! Adhesive Tape  Past History:  Past Medical History: Reviewed history from 09/23/2009 and no changes required. PE and DVT 12-2007, after neck surgery, CT  chest 10-09 neg for PE , coumadin d/c 10-2008 CP 06-2008: admited, r/o for MI, ECHO normal; symptoms likely GI stress test abnormal 2007, but  normal 03-2009 HYPERTENSION  HYPERLIPIDEMIA GERD, HH , Esoph. Stricture DIABETES MELLITUS ?asthma (SOB when attempted  to d/c singulair 11-2008) Pulmonary Nodule , incidental per CT: PET scan 4-9: likely  benign; redo CT 4-10 (Saw Dr Shelle Iron) REFLEX SYMPATHETIC DYSTROPHY  and Neuropathy Osteoporosis Hemorrhoids h/o IBS h/o colon polyps and DIVERTICULOSIS h/ o +PPD HX of Malignant Colon Polyp in 1994 schedule for a Cscope August 2010 Dx w/  early macular degeneration June 2010 (Dr Earlene Plater)  Past Surgical History: Cholecystectomy Hysterectomy w/o bilateral salpingo-oophorectomy hx of cataract sx  rotator cuff surgery 10-08 Dr Eulah Pont Cspine surgery for OA --fussion, Dr Ophelia Charter (12-18-07) (f/u by a PE) 3th R toe amputation d/t osteomyelitis 11-09 Dr Lajoyce Corners  hiatal hernia surgery (01/25/10)  Review of Systems       she continued with dyspnea, this is slightly better since they operate on her large hiatal hernia and she hopes her dyspnea on exertion would be improving more. She is currently taking Ultram for pain control, she was prescribed Vicodin for postoperative pain and after all  she thinks that vicodin  helps better. Likes to go back to it. Time to check her A1 C. Continue to have chronic pain at the ankle, she has seen Dr. Lajoyce Corners for this,  she wonders if she should seek for a second opinion elsewhere.  Physical Exam  General:  alert, well-developed, and well-nourished.   Lungs:  normal respiratory effort, no intercostal retractions, and no accessory muscle use.   Heart:  normal rate, regular rhythm, and no murmur.   Extremities:  no pitting edema   Impression & Recommendations:  Problem # 1:  HIATAL HERNIA (ICD-553.3) status post repair 4/11  Problem # 2:  DIABETES MELLITUS, TYPE II (ICD-250.00) due for labs Her updated medication list for  this problem includes:    Amlodipine Besy-benazepril Hcl 5-10 Mg Caps (Amlodipine besy-benazepril hcl) ..... One by mouth daily    Low-dose Aspirin 81 Mg Tabs (Aspirin) .Marland Kitchen... Take 1 tablet by mouth once a day  Labs Reviewed: Creat: 0.8 (10/18/2009)    Reviewed HgBA1c results: 6.4 (11/19/2009)  6.2 (04/22/2009)  Orders: Venipuncture (16109)  Problem # 3:  REFLEX SYMPATHETIC DYSTROPHY (ICD-337.20) patient likes to change her pain management strategies Darvocet use to work very well for her, she was later on tried Vicodin but it wasn't helping enough, currently on Ultram. After taking Vicodin for few days for postoperative pain, she now realizes that Vicodin works better than the Ultram after all. Likes to go back to Vicodin. done, Rx provided,pain contract signed   Problem # 4:  DEGENERATIVE JOINT DISEASE (ICD-715.90) see #3 I did recommend her to seek a second opinion regards  her chronic ankle pain possible ankle replacement. The following medications were removed from the medication list:    Percocet 5-325 Mg Tabs (Oxycodone-acetaminophen) ..... Uad    Ultram 50 Mg Tabs (Tramadol hcl) .Marland Kitchen... 2 by mouth three times a day Her updated medication list for this problem includes:    Celebrex 200 Mg Caps (Celecoxib) .Marland Kitchen... 1 by mouth once daily    Low-dose Aspirin 81 Mg Tabs (Aspirin) .Marland Kitchen... Take 1 tablet by mouth once a day    Vicodin Es 7.5-750 Mg Tabs (Hydrocodone-acetaminophen) ..... One every 4 hours as needed for pain, no more than 4 tablets a day  Problem # 5:  DYSPNEA (ICD-786.05) continue with dyspnea, ? Slightly better after had a hernia repair Saw  pulmonary several months ago, workup: echo showed only mild diastolic dysfunction, no evidence of pulmonary  hypertension VQ Scan negative, no chronic PE Plan: Observe for now, hopefully she will get better after the repair of her large hiatal hernia, has an appointment to see cardiology in late June  Complete Medication List: 1)   Celebrex 200 Mg Caps (Celecoxib) .Marland Kitchen.. 1 by mouth once daily 2)  Amlodipine Besy-benazepril Hcl 5-10 Mg Caps (Amlodipine besy-benazepril hcl) .... One by mouth daily 3)  Meclizine Hcl 25 Mg Tabs (Meclizine hcl) .Marland Kitchen.. 1 by mouth as needed dizzy 4)  Low-dose Aspirin 81 Mg Tabs (Aspirin) .... Take 1 tablet by mouth once a day 5)  Glucometer and Suplies  .... Check once a day 6)  Freestyle Lite Strp (Glucose blood) .... Check bs two times a day dx 250.00 7)  Freestyle Lancets Misc (Lancets) .... Use two times a day dx 250.00 8)  Magnesium Powder  .Marland Kitchen.. 3 tsp once daily 9)  Trivita Nerve Formula  .... Take by mouth daily 10)  Eye Vitamin  11)  Lutein  12)  Voltaren Gel  .... Apply  as needed to ankle per patient 13)  Vitamin D 2000 Unit Tabs (Cholecalciferol) .... 3 capsules daily 14)  Allegra 180 Mg Tabs (Fexofenadine hcl) .Marland Kitchen.. 1 by mouth once daily 15)  Calcium  16)  Boron  17)  Vicodin Es 7.5-750 Mg Tabs (Hydrocodone-acetaminophen) .... One every 4 hours as needed for pain, no more than 4 tablets a day  Patient Instructions: 1)  Please schedule a follow-up appointment in 4 months , fasting Prescriptions: VICODIN ES 7.5-750 MG TABS (HYDROCODONE-ACETAMINOPHEN) One every 4 hours as needed for pain, no more than 4 tablets a day  #120 x 0   Entered and Authorized by:   [name] E. Paz MD   Signed by:   Nolon Rod. Paz MD on 03/11/2010   Method used:   Print then Give to Patient   RxID:   [id number]

## 2010-11-22 NOTE — Progress Notes (Signed)
Summary: request ultram  Phone Note Call from Patient   Details for Reason: PATIENT LEFT MESSAGE ON VM:  Summary of Call: Per last phone note pt wanted to stay on vicodin but she now states that it is not working & would like to try the ultram. Shary Decamp  October 27, 2009 2:56 PM     New/Updated Medications: ULTRAM 50 MG TABS (TRAMADOL HCL) 1 by mouth three times a day as needed Prescriptions: ULTRAM 50 MG TABS (TRAMADOL HCL) 1 by mouth three times a day as needed  #90 x 0   Entered by:   Shary Decamp   Authorized by:   Nolon Rod. Jeremey Bascom MD   Signed by:   Shary Decamp on 10/27/2009   Method used:   Electronically to        [facility]. [phone]* (retail)       [address]       [place], [place]  [address]       Ph: [phone]       Fax: [phone]   RxID:   [id number]

## 2010-11-22 NOTE — Letter (Signed)
Summary: [facility]   Imported By: Freddy Jaksch 01/14/2008 10:11:11  _____________________________________________________________________  External Attachment:    Type:   Image     Comment:   External Document

## 2010-11-22 NOTE — Medication Information (Signed)
Summary: Approval for Tramadol/[facility]  Approval for [employer]   Imported By: Lanelle Bal 11/26/2009 12:47:55  _____________________________________________________________________  External Attachment:    Type:   Image     Comment:   External Document

## 2010-11-22 NOTE — Miscellaneous (Signed)
Summary: Controlled Substance Agreement  Controlled Substance Agreement   Imported By: Lanelle Bal 03/22/2010 13:18:02  _____________________________________________________________________  External Attachment:    Type:   Image     Comment:   External Document

## 2010-11-22 NOTE — Consult Note (Signed)
Summary: [facility]   Imported By: Sherian Rein 01/18/2010 11:32:18  _____________________________________________________________________  External Attachment:    Type:   Image     Comment:   External Document

## 2010-11-22 NOTE — Progress Notes (Signed)
Summary: record request  ---- Converted from flag ---- ---- 04/15/2010 11:13 AM, Nolon Rod. Paz MD wrote: please call ortho Dr Charlett Blake, need last OV note ------------------------------  left message on med records VM requesting a copy of last ov to be faxed over. awaiting fax or call back.Felecia Deloach CMA  April 19, 2010 11:20 AM   Spoke with med records they are faxing over last OV. Army Fossa CMA  April 20, 2010 10:58 AM  We received fax. Army Fossa CMA  April 20, 2010 11:38 AM

## 2010-11-22 NOTE — Consult Note (Signed)
Summary: [facility]   Imported By: Marylou Mccoy 06/10/2010 11:29:55  _____________________________________________________________________  External Attachment:    Type:   Image     Comment:   External Document

## 2010-11-22 NOTE — Progress Notes (Signed)
Summary: TRIAGE  Medications Added CARAFATE 1 GM/10ML SUSP (SUCRALFATE) 1 gm two times a day       Phone Note Call from Patient Call back at Home Phone [phone]   Caller: Denise-daughter Call For: DR Juanda Chance Reason for Call: Talk to Nurse Summary of Call: Very large hiatel hernia. Wonders if she can be seen sooner or get some advice. Initial call taken by: Leanor Kail [facility],  November 22, 2009 12:10 PM  Follow-up for Phone Call        Per pt. daughter-"We think she is having problems with her Hiatal Hernia." She c/o SOB for several monthes-she has seen Dr.Clance  and Dr.McDowell, nothing diagnosed. She also has back pain, occ. chest and epigastric pain. Also increased nausea. Does take her Nexium two times a day. Only wants to see Dr.Jerlene Rockers.   1) Pt. will see Dr.Cuba Natarajan on 12-06-09 at 11:30am 2) If symptoms become worse call back immediately or go to ER.  Follow-up by: Laureen Ochs LPN,  November 22, 2009 2:56 PM  Additional Follow-up for Phone Call Additional follow up Details #1::        Can You, please, star her on Carafate slurry  10 cc by mouth two times a day, # 12 oz, 1 refill Additional Follow-up by: Hart Carwin MD,  November 22, 2009 10:03 PM    Additional Follow-up for Phone Call Additional follow up Details #2::    Left message for patient to call back Darcey Nora RN, [facility]  November 23, 2009 3:28 PM  patient notified.  She will start on rx and keep rev from 12-06-09 Follow-up by: Darcey Nora RN, CGRN,  November 23, 2009 4:10 PM  New/Updated Medications: CARAFATE 1 GM/10ML SUSP (SUCRALFATE) 1 gm two times a day Prescriptions: CARAFATE 1 GM/10ML SUSP (SUCRALFATE) 1 gm two times a day  #12 oz x 1   Entered by:   Darcey Nora RN, CGRN   Authorized by:   Hart Carwin MD   Signed by:   Darcey Nora RN, CGRN on 11/23/2009   Method used:   Electronically to        [facility]. [phone]* (retail)       [address]       [place], [place]  [address]       Ph: [phone]       Fax: [phone]   RxID:   [id number]

## 2010-11-22 NOTE — Progress Notes (Signed)
Summary: Going to ER- severe calf pains  Phone Note Call from Patient Call back at [facility] Phone [phone]   Summary of Call: Pt called and left a VM on the triage line stating that she has a history of Blood clots and she is having severe pains in her lower calf. I called pt back and instructed her to go to the ER since she has a history of this. Pt verbalized that she would go to the ER. Army Fossa CMA  June 17, 2010 1:12 PM

## 2010-11-22 NOTE — Assessment & Plan Note (Signed)
Summary: F3W  Medications Added PERCOCET 5-325 MG TABS (OXYCODONE-ACETAMINOPHEN) UAD VITAMIN D 2000 UNIT TABS (CHOLECALCIFEROL) 3 capsules daily      Allergies Added:   Visit Type:  Follow-up Referring Provider:  n/a Primary Provider:  [name], MD   History of Present Illness: The patient presents today for routine cardiology followup. She recently had a corrective procedure for intrathoracic stomach/ hiatal hernia.  She reports having an IVC filter placed at the same time.   She reports having some improvement in her dypsnea since that time.  Unfortunately, he exercise tolerance has not completely returned.  The patient denies symptoms of palpitations, chest pain,  orthopnea, PND, lower extremity edema, dizziness, presyncope, syncope, or neurologic sequela. The patient is tolerating medications without difficulties and is otherwise without complaint today.   Current Medications (verified): 1)  Meclizine Hcl 25 Mg Tabs (Meclizine Hcl) .Marland Kitchen.. 1 By Mouth As Needed Dizzy 2)  Low-Dose Aspirin 81 Mg  Tabs (Aspirin) .... Take 1 Tablet By Mouth Once A Day 3)  Glucometer and Suplies .... Check Once A Day 4)  Freestyle Lite   Strp (Glucose Blood) .... Check Bs Two Times A Day Dx 250.00 5)  Freestyle Lancets   Misc (Lancets) .... Use Two Times A Day Dx 250.00 6)  Magnesium Powder .Marland Kitchen.. 3 Tsp Once Daily 7)  Trivita Nerve Formula .... Take By Mouth Daily 8)  Mvi 9)  Eye Vitamin 10)  Lutein 11)  Amlodipine Besy-Benazepril Hcl 5-10 Mg Caps (Amlodipine Besy-Benazepril Hcl) .... One By Mouth Daily 12)  Voltaren Gel .... Apply As Needed To Ankle Per Patient 13)  Percocet 5-325 Mg Tabs (Oxycodone-Acetaminophen) .... Uad 14)  Vitamin D 2000 Unit Tabs (Cholecalciferol) .... 3 Capsules Daily  Allergies (verified): 1)  ! Talwin 2)  ! Methadone Hcl 3)  ! Tranxene-T 4)  ! * ? Antibiotics 5)  ! * Cefazolin 6)  ! Doxycycline 7)  ! * Enablex 8)  ! Adhesive Tape  Past History:  Past Medical  History: Reviewed history from 09/23/2009 and no changes required. PE and DVT 12-2007, after neck surgery, CT chest 10-09 neg for PE , coumadin d/c 10-2008 CP 06-2008: admited, r/o for MI, ECHO normal; symptoms likely GI stress test abnormal 2007, but  normal 03-2009 HYPERTENSION  HYPERLIPIDEMIA GERD, HH , Esoph. Stricture DIABETES MELLITUS ?asthma (SOB when attempted  to d/c singulair 11-2008) Pulmonary Nodule , incidental per CT: PET scan 4-9: likely  benign; redo CT 4-10 (Saw Dr Shelle Iron) REFLEX SYMPATHETIC DYSTROPHY  and Neuropathy Osteoporosis Hemorrhoids h/o IBS h/o colon polyps and DIVERTICULOSIS h/ o +PPD HX of Malignant Colon Polyp in 1994 schedule for a Cscope August 2010 Dx w/  early macular degeneration June 2010 (Dr Earlene Plater)  Past Surgical History: Reviewed history from 04/22/2009 and no changes required. Cholecystectomy Hysterectomy w/o bilateral salpingo-oophorectomy hx of cataract sx  rotator cuff surgery 10-08 Dr Eulah Pont Cspine surgery for OA --fussion, Dr Ophelia Charter (12-18-07) (f/u by a PE) 3th R toe amputation d/t osteomyelitis 11-09 Dr Lajoyce Corners   Social History: Reviewed history from 07/17/2008 and no changes required. Divorced daughter lives with her pt has children. pt is retired from Engineer, site.  Patient has never smoked.  Alcohol Use - no Illicit Drug Use - no  Review of Systems       All systems are reviewed and negative except as listed in the HPI.   Vital Signs:  Patient profile:   76 year old female Height:      77  inches Weight:      182 pounds BMI:     30.40 Pulse rate:   92 / minute BP sitting:   100 / 70  (left arm)  Vitals Entered By: Laurance Flatten CMA (February 16, 2010 11:37 AM)  Physical Exam  General:  Well developed, well nourished, in no acute distress. Head:  normocephalic and atraumatic Eyes:  PERRLA/EOM intact; conjunctiva and lids normal. Mouth:  Teeth, gums and palate normal. Oral mucosa normal. Neck:  Neck supple, no JVD. No masses,  thyromegaly or abnormal cervical nodes. Lungs:  Clear bilaterally to auscultation and percussion. Heart:  Non-displaced PMI, chest non-tender; regular rate and rhythm, S1, S2 without murmurs, rubs or gallops. Carotid upstroke normal, no bruit. Normal abdominal aortic size, no bruits. Femorals normal pulses, no bruits. Pedals normal pulses. No edema, no varicosities. Abdomen:  Bowel sounds positive; abdomen soft and non-tender without masses, organomegaly, or hernias noted. No hepatosplenomegaly. Msk:  Back normal, normal gait. Muscle strength and tone normal. Pulses:  pulses normal in all 4 extremities Extremities:  No clubbing or cyanosis. Neurologic:  Alert and oriented x 3. Skin:  Intact without lesions or rashes. Cervical Nodes:  no significant adenopathy Psych:  Normal affect.   EKG  Procedure date:  02/16/2010  Findings:      sinus rhythm 92 bpm, otherwise normal ekg  Impression & Recommendations:  Problem # 1:  DYSPNEA (ICD-786.05) Her SOB appears to be somewhat improved s/p recent corrective surgery for intrathoracic stomach/ hiatal hernia.  She previously had significant dypsnea with eating, suggesting that this may be playing a part. I suspect that her dypsnea is multifactoral. We will allow her to continue to recover from her recent surgery and reassess in 4-6 weeks. I have encouarged regular activity and exercise as tolerated in the interim. If her symptoms are not improved, we will consider further cardiac testing at that time (TEE/RHC/LHC vs CPX) no changes today  Problem # 2:  HYPERTENSION (ICD-401.9) stable  Problem # 3:  GERD (ICD-530.81) as above  Other Orders: EKG w/ Interpretation (93000)  Patient Instructions: 1)  Your physician recommends that you schedule a follow-up appointment in: 4-6 weeks with Dr Johney Frame

## 2010-11-22 NOTE — Assessment & Plan Note (Signed)
Summary: WORSENING SOB, CHEST/EPIGASTRIC PAIN, NAUSEA             [name]    History of Present Illness Visit Type: follow up  Primary GI MD: [name] MD Primary Provider: [name], MD Requesting Provider: n/a Chief Complaint: Epigastric pain, SOB, and nausea  History of Present Illness:   This is a 76 year old white female with chest pain and shortness of breath which is worse after meals and is usually present at night. It started about one year ago. She has undergone both a cardiology and pulmonary workup. Her ejection fraction is  60% and she had a normal CT angiogram in December 2010. We have followed her for many years for irritable bowel syndrome. She also had a gastric ulcer or an upper endoscopy in 2003 as well as a malignant rectal polyp found on a colonoscopy in 1994. She has had numerous endoscopic exams with findings of a large hiatal hernia and mild esophageal stricture. Her last upper endoscopy in August 2008 showed a mild stricture, tortuous esophagus with spasm and angulated lower esophageal sphincter. She was dilated with Savary dilators 17 mm. Her hernia extended from 32-36 cm. A CT Scan of the abdomen completed n October 2009 showed a large hiatal hernia with a paraesophageal component. Other medical problems include diabetes, hyperlipidemia, high blood pressure. She has a history of a cholecystectomy in 2003. She saw Dr.Voytek for interscapular pain. She had an x-ray which showed some degenerative changes in the thoracic spine but no other convincing evidence that her back pain is due to DJD. It was recommended to her to come see [place] to rule out the possibility of a hiatal hernia causing her shortness of breath.   GI Review of Systems    Reports abdominal pain and  nausea.     Location of  Abdominal pain: epigastric area.    Denies acid reflux, belching, bloating, chest pain, dysphagia with liquids, dysphagia with solids, heartburn, loss of appetite, vomiting, vomiting blood,  weight loss, and  weight gain.        Denies anal fissure, black tarry stools, change in bowel habit, constipation, diarrhea, diverticulosis, fecal incontinence, heme positive stool, hemorrhoids, irritable bowel syndrome, jaundice, light color stool, liver problems, rectal bleeding, and  rectal pain.    Current Medications (verified): 1)  Meclizine Hcl 25 Mg Tabs (Meclizine Hcl) .Marland Kitchen.. 1 By Mouth As Needed Dizzy 2)  Celebrex 200 Mg Caps (Celecoxib) .Marland Kitchen.. 1 By Mouth Once Daily 3)  Nexium 40 Mg Cpdr (Esomeprazole Magnesium) .... Take 1 Tablet By Mouth Two Times A Day 4)  Allegra 180 Mg Tabs (Fexofenadine Hcl) .Marland Kitchen.. 1 By Mouth Once Daily 5)  Low-Dose Aspirin 81 Mg  Tabs (Aspirin) .... Take 1 Tablet By Mouth Once A Day 6)  Glucometer and Suplies .... Check Once A Day 7)  Freestyle Lite   Strp (Glucose Blood) .... Check Bs Two Times A Day Dx 250.00 8)  Freestyle Lancets   Misc (Lancets) .... Use Two Times A Day Dx 250.00 9)  Calcium + D 600-200 Mg-Unit Tabs (Calcium Carbonate-Vitamin D) .... Take 3 Tabs By Mouth Daily 10)  Boron 3mg  .... 3 By Mouth Qd 11)  Magnesium Powder .Marland Kitchen.. 3 Tsp Once Daily 12)  Trivita Nerve Formula .... Take By Mouth Daily 13)  Singulair 10 Mg Tabs (Montelukast Sodium) .Marland Kitchen.. 1 By Mouth Once Daily 14)  Mvi 15)  Eye Vitamin 16)  Lutein 17)  Vitamin D3 10000 Unit Caps (Cholecalciferol) .... 3 Caps  By Mouth Daily 18)  Flexcin Joint .... Take 1 Tablet By Mouth Once A Day 19)  Amlodipine Besy-Benazepril Hcl 5-10 Mg Caps (Amlodipine Besy-Benazepril Hcl) .... One By Mouth Daily 20)  Ultram 50 Mg Tabs (Tramadol Hcl) .... 2 By Mouth Three Times A Day 21)  Voltaren Gel .... Apply As Needed To Ankle Per Patient 22)  Carafate 1 Gm/27ml Susp (Sucralfate) .Marland Kitchen.. 1 Gm Two Times A Day  Allergies (verified): 1)  ! Talwin 2)  ! Methadone Hcl 3)  ! Tranxene-T 4)  ! * ? Antibiotics 5)  ! * Cefazolin 6)  ! Doxycycline 7)  ! * Enablex 8)  ! Adhesive Tape  Past History:  Past Medical  History: Reviewed history from 09/23/2009 and no changes required. PE and DVT 12-2007, after neck surgery, CT chest 10-09 neg for PE , coumadin d/c 10-2008 CP 06-2008: admited, r/o for MI, ECHO normal; symptoms likely GI stress test abnormal 2007, but  normal 03-2009 HYPERTENSION  HYPERLIPIDEMIA GERD, HH , Esoph. Stricture DIABETES MELLITUS ?asthma (SOB when attempted  to d/c singulair 11-2008) Pulmonary Nodule , incidental per CT: PET scan 4-9: likely  benign; redo CT 4-10 (Saw Dr Shelle Iron) REFLEX SYMPATHETIC DYSTROPHY  and Neuropathy Osteoporosis Hemorrhoids h/o IBS h/o colon polyps and DIVERTICULOSIS h/ o +PPD HX of Malignant Colon Polyp in 1994 schedule for a Cscope August 2010 Dx w/  early macular degeneration June 2010 (Dr Earlene Plater)  Past Surgical History: Reviewed history from 04/22/2009 and no changes required. Cholecystectomy Hysterectomy w/o bilateral salpingo-oophorectomy hx of cataract sx  rotator cuff surgery 10-08 Dr Eulah Pont Cspine surgery for OA --fussion, Dr Ophelia Charter (12-18-07) (f/u by a PE) 3th R toe amputation d/t osteomyelitis 11-09 Dr Lajoyce Corners   Family History: Reviewed history from 09/29/2008 and no changes required. Family Hsitory Headaches Family History Hypertension heart disease: mother (mitral valve replaced) rheumatism: mother No FH of Colon Cancer:  Social History: Reviewed history from 07/17/2008 and no changes required. Divorced daughter lives with her pt has children. pt is retired from Engineer, site.  Patient has never smoked.  Alcohol Use - no Illicit Drug Use - no  Review of Systems       The patient complains of back pain and shortness of breath.  The patient denies allergy/sinus, anemia, anxiety-new, arthritis/joint pain, blood in urine, breast changes/lumps, change in vision, confusion, cough, coughing up blood, depression-new, fainting, fatigue, fever, headaches-new, hearing problems, heart murmur, heart rhythm changes, itching, menstrual pain,  muscle pains/cramps, night sweats, nosebleeds, pregnancy symptoms, skin rash, sleeping problems, sore throat, swelling of feet/legs, swollen lymph glands, thirst - excessive , urination - excessive , urination changes/pain, urine leakage, vision changes, and voice change.         Pertinent positive and negative review of systems were noted in the above HPI. All other ROS was otherwise negative.   Vital Signs:  Patient profile:   76 year old female Height:      65 inches Weight:      192 pounds BMI:     32.07 BSA:     1.95 Pulse rate:   62 / minute Pulse rhythm:   regular BP sitting:   124 / 76  (left arm) Cuff size:   regular  Vitals Entered By: Ok Anis CMA (December 06, 2009 10:59 AM)  Physical Exam  General:  alert, oriented and in no distress. Eyes:  PERRLA, no icterus. Mouth:  No deformity or lesions, dentition normal. Neck:  Supple; no masses or thyromegaly. Lungs:  Clear throughout to auscultation. Heart:  Regular rate and rhythm; no murmurs, rubs,  or bruits. Abdomen:  Soft, relaxed abdomen with normoactive bowel sounds. No tenderness. No distention. No borborygmi. Skin:  Intact without significant lesions or rashes. Psych:  Alert and cooperative. Normal mood and affect.   Impression & Recommendations:  Problem # 1:  HIATAL HERNIA (ICD-553.3) Patient has a 4-6 cm hiatal hernia documented on 2 CT scans, upper GI series as well as upper endoscopies. I am not sure that this is the cause for her shortness of breath. We will order a barium esophagram and the patient will stay on Nexium 40 mg twice a day.The  symptoms from a large HH are usually located in anterior chest rather than  solely between the shoulder blades.One wonders about DJD causing her interscapular pain. Pancreatic cANCER CAN ALSO CAUSE BACK PAIN HUT HER RECENT ct SCAN OF THE ABDOMEN RECENTLY WAS NORMAL.  Problem # 2:  GASTRIC ULCER, HX OF (ICD-V12.71) Patient had a gastric ulcer in 2003 due to the use of  NSAIDs. Currently, the patient is on Celebrex 200 mg daily. She is to continue on Nexium 40 mg twice a day.pENETRATING ULCER MAY CAUSE BACK PAIN. 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 Subsequent colonoscopies in 1995, 1997, 2002, 2004, and 2007 showed polyps. A recall colonoscopy is planned for August 2013.  Patient Instructions: 1)  continue Nexium 40 mg twice a day. 2)  Barium esophagram and upper GI series. 3)  Consider upper endoscopy. 4)  Antireflux measures. 5)  Consider a Nissen fundoplication depending on the GI workup. 6)  Recall colonoscopy August 2013. 7)  Copy sent to : Dr A.Voytek, Dr [name] 8)  The medication list was reviewed and reconciled.  All changed / newly prescribed medications were explained.  A complete medication list was provided to the patient / caregiver.

## 2010-11-22 NOTE — Assessment & Plan Note (Signed)
Summary: 2 MONTH ROV/SL  Medications Added ICAPS MV  TABS (MULTIPLE VITAMINS-MINERALS) once daily      Allergies Added:   Visit Type:  Follow-up Referring [facility]:  Dr Johney Frame Primary [name]:  [name], MD  CC:  shortness of breath -- getting worse.  History of Present Illness: [name] is a 76 y/o woman with h/o HTN, HL, orthostatic hypotension and reflex sympathetic dystrophy. Referred by Dr. Johney Frame for further evaluation of progressive dyspnea.   Says she had neck surgery in February 2009 and since that time has been getting SOB. Initially found to have pulmonary emboli and treated with coumadin for 1 year.  Initially saw Dr. Shelle Iron at the end of 2010.  VQ negative for recurrent emboli.  CT chest showed small lung nodule but no signifcant parenchymal disease.  Echo 12/10 with EF 65% and grade I diastolic dysfx. No signifcant valvular disease.  In 4/11 found to have moderate to large hiatal hernia with stomach protruding into chest. Had that repaired with no improvement in symptoms.  Referred to Dr. Johney Frame who had concern for possible CAD or platypnea/orthodeoxia. 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 RHC normal with RA 5 RV 31/2 PA 27/12 (19) PCW 10 CO normal. No evidence of shunting with sitting up in cath lab. TEE EF 60% trivial MR. Redundant atrial septum. No PFO. Bubble negative.   CPX this week: Resting spriometry normal. pVO2 15.2 (95% predicted) adjusts to 20.7 when corrected for ideal body weight. ve/vco2 slope markedly elevated at 46. O2 pulse 100%. VE/MVV 56%. Sats 97% throughot exercise Felt to be mostly a ventilatory limitation. Cannot exclude concomittant circulatory limitation  Since we last saw her. Had fluoro of diaphragms. R diaphragm elevated at rest but both moved with inspiration. MEP mildly decreased/MIn P essentially normal. ALso saw Dr. Sandria Manly who is ordering MRIs and doing blood tests.  Feels her SOB is getting worse. Can hardly do anything without  dyspnea. Occasional dyspnea. No orthopnea or PND. No CP. Occasional peri-oral cyanosis.   Current Medications (verified): 1)  Amlodipine Besy-Benazepril Hcl 5-10 Mg Caps (Amlodipine Besy-Benazepril Hcl) .... One By Mouth Daily 2)  Meclizine Hcl 25 Mg Tabs (Meclizine Hcl) .Marland Kitchen.. 1 By Mouth As Needed Dizzy 3)  Low-Dose Aspirin 81 Mg  Tabs (Aspirin) .... Take 1 Tablet By Mouth Once A Day 4)  Glucometer and Suplies .... Check Once A Day 5)  Freestyle Lite   Strp (Glucose Blood) .... Check Bs Two Times A Day Dx 250.00 6)  Freestyle Lancets   Misc (Lancets) .... Use Two Times A Day Dx 250.00 7)  Trivita Nerve Formula .... Take By Mouth Daily 8)  Lutein 9)  Voltaren Gel .... Apply As Needed To Ankle Per Patient 10)  Vitamin D 2000 Unit Tabs (Cholecalciferol) .... 3 Capsules Daily 11)  Vicodin Hp 10-660 Mg Tabs (Hydrocodone-Acetaminophen) .... One By Mouth Every 4 Hours As Needed For Pain 12)  Icaps Mv  Tabs (Multiple Vitamins-Minerals) .... Once Daily  Allergies (verified): 1)  ! Talwin 2)  ! Methadone Hcl 3)  ! Tranxene-T 4)  ! * ? Antibiotics 5)  ! * Cefazolin 6)  ! Doxycycline 7)  ! * Enablex 8)  ! * Dilaudid 9)  ! Adhesive Tape  Past History:  Past Medical History: Last updated: 05/20/2010 PE and DVT 12-2007, after neck surgery, CT chest 10-09 neg for PE , coumadin d/c 10-2008 CP 06-2008: admited, r/o for MI, ECHO normal; symptoms likely GI stress test abnormal 2007, but  normal 03-2009 HYPERTENSION  HYPERLIPIDEMIA Large hiatal hernia repair 01/25/10 DIABETES MELLITUS ?asthma (SOB when attempted  to d/c singulair 11-2008) Pulmonary Nodule , incidental per CT: PET scan 4-9: likely  benign, CT 08-2009 no change, no further CTs( Dr Shelle Iron) REFLEX SYMPATHETIC DYSTROPHY  and Neuropathy Osteoporosis Hemorrhoids h/o IBS h/o colon polyps and DIVERTICULOSIS h/ o +PPD HX of Malignant Colon Polyp in 1994, colonoscopy again 8-10, had a biopsy. Next 2015 Dx w/  early macular degeneration June  2010 (Dr Earlene Plater) Orthostatic intolerance Allergic rhinitis  Review of Systems       As per HPI and past medical history; otherwise all systems negative.   Vital Signs:  Patient profile:   76 year old female Height:      65 inches Weight:      193 pounds BMI:     32.23 Pulse rate:   86 / minute BP sitting:   124 / 78  (left arm) Cuff size:   regular  Vitals Entered By: Hardin Negus, RMA (August 31, 2010 10:09 AM)  Physical Exam  General:  elderly. obese woman walks with cane. no resp difficulty HEENT: normal Neck: supple. no JVD. Carotids 2+ bilat; no bruits. No lymphadenopathy or thryomegaly appreciated. Cor: PMI nondisplaced. Regular rate & rhythm. No rubs, gallops, murmur. Lungs: clear Abdomen: obese. soft, nontender, nondistended. No bruits or masses. Good bowel sounds. Extremities: no cyanosis, clubbing, rash, tr edema Neuro: alert & orientedx3, cranial nerves grossly intact. moves all 4 extremities w/o difficulty. strength normal in lower extremities. affect pleasant    Impression & Recommendations:  Problem # 1:  DYSPNEA (ICD-786.05) Continues with severe symptoms but entire cardiopulmonary work-up normal to date except for elevated Ve/VCo2 slope on CPX (suggestive of increased dead space and ventialtory inefficiency). I reassured her that we have yet to find anything severe.  Will await results of neuro work-up. Only recommendation at this point is to proceed with pulmoanry rehab. We will se her back in 3-4 months, if symptoms continue may need referral to tertiary center for 2nd opinion.  Other Orders: EKG w/ Interpretation (93000)  Patient Instructions: 1)  Your physician recommends that you schedule a follow-up appointment in: 3-4 months

## 2010-11-22 NOTE — Progress Notes (Signed)
Summary: PAZ-DARVOCET 100/650MG  QTY 180  Phone Note Refill Request Call back at Home Phone [phone] Message from:  Patient  Refills Requested: Medication #1:  DARVOCET 100/650 MG TAKE 2 TABLETS 3 TIMES A DAY AS NEEDED FOR PAIN PT STATES THAT SHE HAD SURGERY ON HER NECK AND THE DR THAT DID HER SURGERY STATED THAT SHE COULD GO BACK ON THE DARVOCET THAT HER PRIMARY DR WAS PRESCRIBING. SHE GETS A QUANTITY OF 180 TABLETS AT A TIME AND SHE USES THE [facility] AT [address]  Initial call taken by: Gwen Pounds,  February 12, 2008 3:14 PM  Follow-up for Phone Call        ok RF , see prvious instructions Follow-up by: [facility] E. Paz MD,  February 12, 2008 3:59 PM    New/Updated Medications: DARVOCET-N 100 100-650 MG  TABS (PROPOXYPHENE N-APAP) 2 by mouth tid   Prescriptions: DARVOCET-N 100 100-650 MG  TABS (PROPOXYPHENE N-APAP) 2 by mouth tid  #180 x 0   Entered by:   Shary Decamp   Authorized by:   Nolon Rod. Paz MD   Signed by:   Shary Decamp on 02/12/2008   Method used:   Printed then faxed to ...       [facility] #[id number]*       [address]       [place], [place]  [address]       Ph: [phone]       Fax: [phone]   RxID:   [id number]

## 2010-11-22 NOTE — Progress Notes (Signed)
Summary: SCHEDULE TEST-lmtcb x 1  Phone Note Call from Patient Call back at Home Phone [phone]   Caller: Patient Call For: [facility] Summary of Call: PT THINKS SHE IS DUE FOR A PET SCAT OR CT SCAN Initial call taken by: Lacinda Axon,  July 28, 2009 12:33 PM  Follow-up for Phone Call        Per [facility] last "please call in sept of this year to set up final ct scan." [facility] please advise. Zackery Barefoot CMA  July 28, 2009 1:38 PM   Additional Follow-up for Phone Call Additional follow up Details #1::        will send note to [facility] for ct chest this month.  I will call her with results. Additional Follow-up by: Barbaraann Share MD,  July 28, 2009 4:23 PM    Additional Follow-up for Phone Call Additional follow up Details #2::    LMOMTCB Vernie Murders  July 28, 2009 4:26 PM  pt aware that [facility] will call him with results.    Follow-up by: Vernie Murders,  July 28, 2009 5:04 PM

## 2010-11-22 NOTE — Progress Notes (Signed)
Summary: Refill Request  Phone Note Refill Request Call back at [phone] Message from:  Pharmacy on February 17, 2010 8:28 AM  Refills Requested: Medication #1:  MAGIC MOUTHWASH RS 150.00   Supply Requested: 1 month   Last Refilled: 12/31/2009 Next Appointment Scheduled: 5.20.11 Initial call taken by: Harold Barban,  February 17, 2010 8:28 AM    New/Updated Medications: * MAGIC MOUTHWASH swish & spit three times a day x 1 week, ov if sxs persist Prescriptions: MAGIC MOUTHWASH swish & spit three times a day x 1 week, ov if sxs persist  #150cc x 0   Entered by:   Shary Decamp   Authorized by:   Nolon Rod. Paz MD   Signed by:   Shary Decamp on 02/18/2010   Method used:   Printed then faxed to ...       [facility]. [phone]* (retail)       [address]       [place], [place]  [address]       Ph: [phone]       Fax: [phone]   RxID:   [id number]

## 2010-11-22 NOTE — Letter (Signed)
Summary: [facility]   Imported By: Lanelle Bal 03/17/2010 12:28:36  _____________________________________________________________________  External Attachment:    Type:   Image     Comment:   External Document

## 2010-11-22 NOTE — Letter (Signed)
Summary: [facility] Office Visit Note   [facility] Office Visit Note   Imported By: Roderic Ovens 09/08/2010 16:04:27  _____________________________________________________________________  External Attachment:    Type:   Image     Comment:   External Document

## 2010-11-22 NOTE — Progress Notes (Signed)
Summary: refill ultram  Phone Note Refill Request Message from:  Patient  Refills Requested: Medication #1:  ULTRAM 50 MG TABS 2 by mouth three times a day. new dose is working well, ok to refill?  how many?  Initial call taken by: Shary Decamp,  November 12, 2009 4:33 PM  Follow-up for Phone Call        she takes 6 tabs daily, call 180, no RF Jose E. Paz MD  November 12, 2009 4:39 PM   Additional Follow-up for Phone Call Additional follow up Details #1::        RX FAXED PT INFORMED .Kandice Hams  November 12, 2009 4:47 PM  Additional Follow-up by: Kandice Hams,  November 12, 2009 4:47 PM    Prescriptions: ULTRAM 50 MG TABS (TRAMADOL HCL) 2 by mouth three times a day  #180 x 0   Entered by:   Kandice Hams   Authorized by:   Nolon Rod. Paz MD   Signed by:   Kandice Hams on 11/12/2009   Method used:   Printed then faxed to ...       [facility]. [phone]* (retail)       [address]       [place], [place]  [address]       Ph: [phone]       Fax: [phone]   RxID:   [id number]

## 2010-11-22 NOTE — Assessment & Plan Note (Signed)
Summary: per check out/sf  Medications Added FLUDROCORTISONE ACETATE 0.1 MG TABS (FLUDROCORTISONE ACETATE) one by mouth daily      Allergies Added:   Referring Provider:  n/a Primary Provider:  [name], MD  CC:  shortness of breath.  History of Present Illness: The patient presents today for routine cardiology followup.  She reports having some improvement in her dypsnea.  Her exercise tolerance has improved, though she is primarily limited by foot pain.  She is most concerned today about orthostatic intolerance.  She describes dizziness, ligtheadedness, fatigue, and SOB upon standing.  The patient denies symptoms of palpitations, chest pain,  orthopnea, PND, lower extremity edema,presyncope, syncope, or neurologic sequela. The patient is tolerating medications without difficulties and is otherwise without complaint today.   Current Medications (verified): 1)  Amlodipine Besy-Benazepril Hcl 5-10 Mg Caps (Amlodipine Besy-Benazepril Hcl) .... One By Mouth Daily 2)  Meclizine Hcl 25 Mg Tabs (Meclizine Hcl) .Marland Kitchen.. 1 By Mouth As Needed Dizzy 3)  Low-Dose Aspirin 81 Mg  Tabs (Aspirin) .... Take 1 Tablet By Mouth Once A Day 4)  Glucometer and Suplies .... Check Once A Day 5)  Freestyle Lite   Strp (Glucose Blood) .... Check Bs Two Times A Day Dx 250.00 6)  Freestyle Lancets   Misc (Lancets) .... Use Two Times A Day Dx 250.00 7)  Magnesium Powder .Marland Kitchen.. 3 Tsp Once Daily 8)  Trivita Nerve Formula .... Take By Mouth Daily 9)  Eye Vitamin 10)  Lutein 11)  Voltaren Gel .... Apply As Needed To Ankle Per Patient 12)  Vitamin D 2000 Unit Tabs (Cholecalciferol) .... 3 Capsules Daily 13)  Calcium 14)  Boron 15)  Vicodin Es 7.5-750 Mg Tabs (Hydrocodone-Acetaminophen) .... One Every 4 Hours As Needed For Pain, No More Than 4 Tablets A Day  Allergies (verified): 1)  ! Talwin 2)  ! Methadone Hcl 3)  ! Tranxene-T 4)  ! * ? Antibiotics 5)  ! * Cefazolin 6)  ! Doxycycline 7)  ! * Enablex 8)  ! *  Dilaudid 9)  ! Adhesive Tape  Past History:  Past Medical History: PE and DVT 12-2007, after neck surgery, CT chest 10-09 neg for PE , coumadin d/c 10-2008 CP 06-2008: admited, r/o for MI, ECHO normal; symptoms likely GI stress test abnormal 2007, but  normal 03-2009 HYPERTENSION  HYPERLIPIDEMIA GERD, HH , Esoph. Stricture DIABETES MELLITUS ?asthma (SOB when attempted  to d/c singulair 11-2008) Pulmonary Nodule , incidental per CT: PET scan 4-9: likely  benign; redo CT 4-10 (Saw Dr Shelle Iron) REFLEX SYMPATHETIC DYSTROPHY  and Neuropathy Osteoporosis Hemorrhoids h/o IBS h/o colon polyps and DIVERTICULOSIS h/ o +PPD HX of Malignant Colon Polyp in 1994 schedule for a Cscope August 2010 Dx w/  early macular degeneration June 2010 (Dr Earlene Plater) Orthostatic intolerance  Past Surgical History: Reviewed history from 03/11/2010 and no changes required. Cholecystectomy Hysterectomy w/o bilateral salpingo-oophorectomy hx of cataract sx  rotator cuff surgery 10-08 Dr Eulah Pont Cspine surgery for OA --fussion, Dr Ophelia Charter (12-18-07) (f/u by a PE) 3th R toe amputation d/t osteomyelitis 11-09 Dr Lajoyce Corners  hiatal hernia surgery (01/25/10)  Social History: Reviewed history from 07/17/2008 and no changes required. Divorced daughter lives with her pt has children. pt is retired from Engineer, site.  Patient has never smoked.  Alcohol Use - no Illicit Drug Use - no  Review of Systems       All systems are reviewed and negative except as listed in the HPI.   Vital Signs:  Patient profile:   76 year old female Height:      65 inches Weight:      183 pounds BMI:     30.56 Pulse rate:   114 / minute Pulse (ortho):   110 / minute Resp:     18 per minute BP sitting:   142 / 82  (left arm) BP standing:   115 / 80  Vitals Entered By: Marrion Coy, CNA (April 13, 2010 11:13 AM)  Serial Vital Signs/Assessments:  Time      Position  BP       Pulse  Resp  Temp     By           Lying RA  128/77   89                     Christy Tuck, CNA           Sitting   121/84   95                    Christy Tuck, CNA           Standing  115/80   110                   Christy Tuck, CNA  Comments: Lying- No complaints Sitting- Shortness of breath Standing- Shortness of breath, dizziness  By: Marrion Coy, CNA  2 mIN Standing 2 Min- unable to finish test, due to shortness of breath, dizizness, and seeing black spots By: Marrion Coy, CNA    Physical Exam  General:  Well developed, well nourished, in no acute distress. Head:  normocephalic and atraumatic Eyes:  PERRLA/EOM intact; conjunctiva and lids normal. Mouth:  Teeth, gums and palate normal. Oral mucosa normal. Neck:  Neck supple, no JVD. No masses, thyromegaly or abnormal cervical nodes. Lungs:  Clear bilaterally to auscultation and percussion. Heart:  Non-displaced PMI, chest non-tender; regular rate and rhythm, S1, S2 without murmurs, rubs or gallops. Carotid upstroke normal, no bruit. Normal abdominal aortic size, no bruits. Femorals normal pulses, no bruits. Pedals normal pulses. No edema, no varicosities. Abdomen:  Bowel sounds positive; abdomen soft and non-tender without masses, organomegaly, or hernias noted. No hepatosplenomegaly. Msk:  Back normal, normal gait. Muscle strength and tone normal. Pulses:  pulses normal in all 4 extremities Extremities:  No clubbing or cyanosis. Neurologic:  Alert and oriented x 3. Skin:  Intact without lesions or rashes. Psych:  Normal affect.   Impression & Recommendations:  Problem # 1:  ORTHOSTATIC DIZZINESS (ICD-780.4) The patient is clearly orthostatic by vitals and symptoms today.  We will increase hydration.  I have also started florinef today.  Problem # 2:  HYPERTENSION (ICD-401.9) stable  given orthostatic intolerance, I would not be very aggressive with BP control at this time.  Problem # 3:  DYSPNEA (ICD-786.05) gradually improving we will continue to monitor If not resolved, we  will continue to consider RHC/LHC  Other Orders: TLB-BMP (Basic Metabolic Panel-BMET) (80048-METABOL)  Patient Instructions: 1)  Your physician recommends that you schedule a follow-up appointment in: 4 weeks with Dr Johney Frame 2)  Your physician recommends that you return for lab work today 3)  Your physician has recommended you make the following change in your medication: start Fludrocortisone 0.1mg  daily Prescriptions: FLUDROCORTISONE ACETATE 0.1 MG TABS (FLUDROCORTISONE ACETATE) one by mouth daily  #30 x 3   Entered by:   Dennis Bast, RN, BSN   Authorized  by:   Hillis Range, MD   Signed by:   Dennis Bast, RN, BSN on 04/13/2010   Method used:   Electronically to        [facility]. [phone]* (retail)       [address]       [place], [place]  [address]       Ph: [phone]       Fax: [phone]   RxID:   [id number]

## 2010-11-22 NOTE — Assessment & Plan Note (Signed)
Summary: PT CHECK/CDJ  Nurse Visit   Vital Signs:  Patient Profile:   76 Years Old Female CC:      pt check Height:     65 inches Weight:      220 pounds BP sitting:   110 / 72  (left arm)  Vitals Entered By: Doristine Devoid (February 03, 2008 1:14 PM)                 Prior Medications: SINGULAIR 10 MG TABS (MONTELUKAST SODIUM) 1 by mouth qd MECLIZINE HCL 25 MG TABS (MECLIZINE HCL) 1 by mouth as needed dizzy LOTREL 5-20 MG CAPS (AMLODIPINE BESY-BENAZEPRIL HCL) 1 by mouth qd CELEBREX 200 MG CAPS (CELECOXIB) 1 by mouth once daily REGLAN 5 MG TABS (METOCLOPRAMIDE HCL) Take 1 tablet by mouth three times a day NEXIUM 40 MG CPDR (ESOMEPRAZOLE MAGNESIUM) bid AMITRIPTYLINE HCL 25 MG TABS (AMITRIPTYLINE HCL) Take 1 tablet by mouth three times a day ALLEGRA 180 MG TABS (FEXOFENADINE HCL) 1 by mouth once daily ENABLEX 15 MG  TB24 (DARIFENACIN HYDROBROMIDE) 1 by mouth once daily OXYCODONE-ACETAMINOPHEN 5-500 MG  CAPS (OXYCODONE-ACETAMINOPHEN) take 1 to 2 tabs every 4 hours as needed COUMADIN 5 MG  TABS (WARFARIN SODIUM) AS DIRECTEED GLUCOMETER AND SUPLIES () check once a day FREESTYLE LITE   STRP (GLUCOSE BLOOD) check BS two times a day dx 250.00 FREESTYLE LANCETS   MISC (LANCETS) use two times a day dx 250.00 TRIVITA NERVE FORMULA () take by mouth daily LOW-DOSE ASPIRIN 81 MG  TABS (ASPIRIN) Take 1 tablet by mouth once a day CALCIUM 600MG  + VITAMIN D () take 3 tab by mouth daily Current Allergies: ! TALWIN ! METHADONE HCL ! TRANXENE-T ! ADHESIVE TAPE Laboratory Results   Blood Tests      INR: 2.5   (Normal Range: 0.88-1.12   Therap INR: 2.0-3.5)      Orders Added: 1)  Est. Patient Level I [57846] 2)  Protime Ila.Stager    ]  ANTICOAGULATION RECORD PREVIOUS REGIMEN & LAB RESULTS Anticoagulation Diagnosis:  PE on  01/20/2008  Previous INR:  2.4 on  01/20/2008 Previous Coumadin Dose(mg):  5mg  tabs on  01/20/2008 Previous Regimen:  CONTINUE WITH CURRENT DOSE: 1 PO QD  EXCEPT 1 1/2 ON TUES, THURS, & SAT on  01/20/2008 Previous Coagulation Comments:  PT TOOK LAST DOSE OF LOVENOX 12/30/07 AM on  01/02/2008  NEW REGIMEN & LAB RESULTS Anticoag. Dx: PE Current INR Goal Range: 2-3 Current INR: 2.5 Current Coumadin Dose(mg): 1 qd except 1 1/2 tues,thurs,sat Regimen: NO CHANGE Coagulation Comments: recheck 4 wks  Anticoagulation Visit Questionnaire Coumadin dose missed/changed:  No Abnormal Bleeding Symptoms:  No  Any diet changes including alcohol intake, vegetables or greens since the last visit:  No Any illnesses or hospitalizations since the last visit:  No Any signs of clotting since the last visit (including chest discomfort, dizziness, shortness of breath, arm tingling, slurred speech, swelling or redness in leg):  No  MEDICATIONS SINGULAIR 10 MG TABS (MONTELUKAST SODIUM) 1 by mouth qd MECLIZINE HCL 25 MG TABS (MECLIZINE HCL) 1 by mouth as needed dizzy LOTREL 5-20 MG CAPS (AMLODIPINE BESY-BENAZEPRIL HCL) 1 by mouth qd CELEBREX 200 MG CAPS (CELECOXIB) 1 by mouth once daily REGLAN 5 MG TABS (METOCLOPRAMIDE HCL) Take 1 tablet by mouth three times a day NEXIUM 40 MG CPDR (ESOMEPRAZOLE MAGNESIUM) bid AMITRIPTYLINE HCL 25 MG TABS (AMITRIPTYLINE HCL) Take 1 tablet by mouth three times a day ALLEGRA 180 MG TABS (FEXOFENADINE HCL) 1 by  mouth once daily ENABLEX 15 MG  TB24 (DARIFENACIN HYDROBROMIDE) 1 by mouth once daily OXYCODONE-ACETAMINOPHEN 5-500 MG  CAPS (OXYCODONE-ACETAMINOPHEN) take 1 to 2 tabs every 4 hours as needed COUMADIN 5 MG  TABS (WARFARIN SODIUM) AS DIRECTEED * GLUCOMETER AND SUPLIES check once a day FREESTYLE LITE   STRP (GLUCOSE BLOOD) check BS two times a day dx 250.00 FREESTYLE LANCETS   MISC (LANCETS) use two times a day dx 250.00 * TRIVITA NERVE FORMULA take by mouth daily LOW-DOSE ASPIRIN 81 MG  TABS (ASPIRIN) Take 1 tablet by mouth once a day * CALCIUM 600MG  + VITAMIN D take 3 tab by mouth daily

## 2010-11-22 NOTE — Letter (Signed)
Summary: [facility]  [facility]   Imported By: Lanelle Bal 12/01/2009 12:56:32  _____________________________________________________________________  External Attachment:    Type:   Image     Comment:   External Document

## 2010-11-22 NOTE — Letter (Signed)
Summary: [facility] Pal[facility] Order Form  [facility] Order [facility]   Imported By: Marylou Mccoy 07/20/2010 11:04:06  _____________________________________________________________________  External Attachment:    Type:   Image     Comment:   External Document

## 2010-11-22 NOTE — Cardiovascular Report (Signed)
Summary: Pre-Cath Orders  Pre-Cath Orders   Imported By: Marylou Mccoy 05/30/2010 15:34:20  _____________________________________________________________________  External Attachment:    Type:   Image     Comment:   External Document

## 2010-11-22 NOTE — Letter (Signed)
Summary: [facility]   Imported By: Lester Otwell 05/13/2010 08:17:37  _____________________________________________________________________  External Attachment:    Type:   Image     Comment:   External Document

## 2010-11-22 NOTE — Progress Notes (Signed)
Summary: tramadol approved from 11/15/09 until 12/16/09  Phone Note Other Incoming   Caller: fax from [employer] Summary of Call: Tramadol HCL has been approved from 11/16/09 until 12/16/09, faxed scanned into EMR  Initial call taken by: Shary Decamp,  November 17, 2009 1:00 PM

## 2010-11-22 NOTE — Progress Notes (Signed)
Summary: TRIAGE  Phone Note Call from Patient Call back at Home Phone [phone]   Caller: DAUGHTER - denise Summary of Call: Patient is scheduled for surgery consult with Dr. Daphine Deutscher on 3/10 for large [facility].  They believe all of her breathing problems are coming from her large hiatal hernia.  Daughter wants to know if there is anything else she can do to help her breathing until the appt.  Patient is having difficulty moving due to dyspnea.  Daughter is Social research officer, government daily to see if they have any cancellations.  Initial call taken by: Shary Decamp,  December 20, 2009 1:25 PM  Follow-up for Phone Call        if the DOE is caused by a large HH, there is no much we can do about it.  I will send this message to GI for suggestions Jose E. Paz MD  December 20, 2009 3:01 PM   Additional Follow-up for Phone Call Additional follow up Details #1::        Actually it sounds strange to have so much DOE due to a large HH. Her O2 sats were ok in Pulmonary. And the cardiology eval was also negative. There is certainly no emergency to see a sugeon. Head of bed elevation. Add Reglan 5 mg by mouth ac ( three times a day)., #60 , 1 refill Additional Follow-up by: Hart Carwin MD,  December 20, 2009 3:34 PM    Additional Follow-up for Phone Call Additional follow up Details #2::    Above MD orders reviewed with patient's daughter. Reglan sent to pharmacy. Pt. instructed to call back as needed.  Follow-up by: Laureen Ochs LPN,  December 20, 2009 4:14 PM  New/Updated Medications: REGLAN 5 MG TABS (METOCLOPRAMIDE HCL) Take one by mouth 30 minutes before breakfast, lunch, & dinner. Prescriptions: REGLAN 5 MG TABS (METOCLOPRAMIDE HCL) Take one by mouth 30 minutes before breakfast, lunch, & dinner.  #60 x 1   Entered by:   Laureen Ochs LPN   Authorized by:   Hart Carwin MD   Signed by:   Laureen Ochs LPN on 09/81/1914   Method used:   Electronically to        [facility]. [phone]*  (retail)       [address]       [place], [place]  [address]       Ph: [phone]       Fax: [phone]   RxID:   [id number]

## 2010-11-22 NOTE — Letter (Signed)
Summary: Cardiac Catheterization Instructions- [facility]  [facility], Main Office  [address]   [place], [place] [address]   Phone: [phone]  Fax: [phone]     05/20/2010 MRN: [id number]  [name] [address] [place], [place]  [address]  Dear [name],   You are scheduled for a Cardiac Catheterization on 05/27/10 with Dr.Bensimhon  Please arrive to the 1st floor of the [facility] at [facility] at 6:30 am on the day of your procedure. Please do not arrive before 6:30 a.m. Call the Heart and Vascular Center at [phone] if you are unable to make your appointmnet. The Code to get into the parking garage under the building is 0002. Take the elevators to the 1st floor. You must have someone to drive you home. Someone must be with you for the first 24 hours after you arrive home. Please wear clothes that are easy to get on and off and wear slip-on shoes. Do not eat or drink after midnight except water with your medications that morning. Bring all your medications and current insurance cards with you.    ___ Make sure you take your aspirin.  ___ You may take ALL of your medications with water that morning.    The usual length of stay after your procedure is 2 to 3 hours. This can vary.  If you have any questions, please call the office at the number listed above.   Dennis Bast, RN, BSN

## 2010-11-22 NOTE — Procedures (Signed)
Summary: Upper Endoscopy  Patient: [name] Note: All result statuses are Final unless otherwise noted.  Tests: (1) Upper Endoscopy (EGD)   EGD Upper Endoscopy       DONE     [facility]     [address]     [place], [place]  [address]           ENDOSCOPY PROCEDURE REPORT           PATIENT:  [name], [name]  MR#:  [id number]     BIRTHDATE:  [birth year], 74 yrs. old  GENDER:  female           ENDOSCOPIST:  Hedwig Morton. Juanda Chance, MD     Referred by:  Quita Skye Allred, M.D.           PROCEDURE DATE:  12/17/2009     PROCEDURE:  EGD with dilatation over guidewire     ASA CLASS:  Class II     INDICATIONS:  chest pain noncardiac chest pain suspected- negative     cardiac work up     Ba esophagram -13 mm tablet stopped at g-e junction,     EGD 2008 es stricture, 4 cm H Hernia and tortuous esophagus           MEDICATIONS:   Versed 6 mg, Fentanyl 62.5 mcg     TOPICAL ANESTHETIC:  Exactacain Spray           DESCRIPTION OF PROCEDURE:   After the risks benefits and     alternatives of the procedure were thoroughly explained, informed     consent was obtained.  The [facility] [id number] endoscope was     introduced through the mouth and advanced to the second portion of     the duodenum, without limitations.  The instrument was slowly     withdrawn as the mucosa was fully examined.     <<PROCEDUREIMAGES>>           Presbyesophagus was found (see image1 and image8). large tortuopus     esophagus  A stricture was found in the distal esophagus. non     obstructing eccentric stricture, fibrous ring at g-e junction     Savary dilation over a guidewire 16 (see image7, image4, and     image3).17mm dilators  A hiatal hernia was found (see image2 and     image5). giant hiatal hernia 31-39cm, nonreducible  The duodenal     bulb was normal in appearance, as was the postbulbar duodenum (see     image6).    Retroflexed views revealed no abnormalities.    The     scope was then withdrawn from the  patient and the procedure     completed.           COMPLICATIONS:  None           ENDOSCOPIC IMPRESSION:     1) Presbyesophagus     2) Stricture in the distal esophagus     3) Hiatal hernia     4) Normal duodenum     tortuopus slightly dilated esophagus, no food retained     giant 8cm HH, nonreducible     /p passage of 16 and 17 mm dilators     RECOMMENDATIONS:     1) Anti-reflux regimen to be follow     Nexiem 40 mg po bid,     referral for surgical opinion of a reduction of large HH which     is likely  implicated in pt's chest pain           REPEAT EXAM:  No           ______________________________     Hedwig Morton. Juanda Chance, MD           CC:           n.     eSIGNED:   Hedwig Morton. Toryn Mcclinton at 12/17/2009 03:05 PM           Celise, Bazar, [phone]  Note: An exclamation mark (!) indicates a result that was not dispersed into the flowsheet. Document Creation Date: 12/17/2009 3:05 PM _______________________________________________________________________  (1) Order result status: Final Collection or observation date-time: 12/17/2009 14:51 Requested date-time:  Receipt date-time:  Reported date-time:  Referring Physician:   Ordering Physician: [name] [phone]) Specimen Source:  Source: Launa Grill Order Number: [id number] Lab site:

## 2010-11-22 NOTE — Progress Notes (Signed)
Summary: [name]--PT CHECK  Phone Note Call from Patient Call back at [facility] Phone [phone]   Caller: Patient Summary of Call: PATIENT IS CALLING WANTING TO KNOW WHEN TO COME BACK IN FOR HER PT CHECK//PH Initial call taken by: Freddy Jaksch,  August 12, 2008 12:40 PM  Follow-up for Phone Call        last INR was 1.9 @ hosp on 10/13 -- the day before her surgery -- coumadin was not d/c'd for surgery ........Marland KitchenShary Decamp  August 12, 2008 12:49 PM if she is   on her regular dose, recehck in 2 weeks  Magie Ciampa E. Kayse Puccini MD  August 12, 2008 2:25 PM  office visit scheduled for patient .Marland KitchenMarland KitchenMarland KitchenShary Decamp  August 12, 2008 3:34 PM

## 2010-11-22 NOTE — Progress Notes (Signed)
Summary: when will pulm rehab start   Phone Note Call from Patient Call back at Home Phone [phone]   Caller: Daughter/[name] Reason for Call: Talk to Nurse, Talk to Doctor, Lab or Test Results Summary of Call: pt daughter wants to know when the pulm rehab will start Initial call taken by: Omer Jack,  July 08, 2010 2:01 PM  Follow-up for Phone Call        order was faxed last week will f/u on it on Mon Meredith Staggers, RN  July 08, 2010 5:59 PM     spoke w/pts daughter reagrding pft results, also pulm rehab is a couple of weeks behind, lab results are still pending Meredith Staggers, RN  July 14, 2010 5:21 PM   Additional Follow-up for Phone Call Additional follow up Details #1::        pts daughter given lab results, she states pulm rehab has contacted her and sch class, they will keep appt w/Dr Sandria Manly on 11/4 Meredith Staggers, RN  July 22, 2010 11:12 AM

## 2010-11-22 NOTE — Assessment & Plan Note (Signed)
Summary: pt.cbs  Nurse Visit   Vital Signs:  Patient Profile:   76 Years Old Female Weight:      217 pounds O2 Sat:      97 % O2 treatment:    Room Air Pulse rate:   84 / minute BP sitting:   138 / 80  Vitals Entered By: Shary Decamp (January 20, 2008 10:05 AM)             Comments woke up yesterday am with with chest feeling heavy increased SOB with exertion ..................................................................Marland KitchenShary Decamp  January 20, 2008 10:08 AM      Prior Medications: SINGULAIR 10 MG TABS (MONTELUKAST SODIUM) 1 by mouth qd MECLIZINE HCL 25 MG TABS (MECLIZINE HCL) 1 by mouth as needed dizzy LOTREL 5-20 MG CAPS (AMLODIPINE BESY-BENAZEPRIL HCL) 1 by mouth qd CELEBREX 200 MG CAPS (CELECOXIB) 1 by mouth once daily REGLAN 5 MG TABS (METOCLOPRAMIDE HCL) three times a day as needed NEXIUM 40 MG CPDR (ESOMEPRAZOLE MAGNESIUM) bid AMITRIPTYLINE HCL 25 MG TABS (AMITRIPTYLINE HCL) 3 by mouth at bedtime ALLEGRA 180 MG TABS (FEXOFENADINE HCL) 1 by mouth once daily ENABLEX 15 MG  TB24 (DARIFENACIN HYDROBROMIDE) 1 by mouth once daily OXYCODONE-ACETAMINOPHEN 5-500 MG  CAPS (OXYCODONE-ACETAMINOPHEN) take 1 to 2 tabs every 4 hours as needed COUMADIN 5 MG  TABS (WARFARIN SODIUM) AS DIRECTEED GLUCOMETER AND SUPLIES () check once a day FREESTYLE LITE   STRP (GLUCOSE BLOOD) check BS two times a day dx 250.00 FREESTYLE LANCETS   MISC (LANCETS) use two times a day dx 250.00 TRIVITA NERVE FORMULA () take by mouth daily LOW-DOSE ASPIRIN 81 MG  TABS (ASPIRIN) Take 1 tablet by mouth once a day CALCIUM 600MG  + VITAMIN D () take 3 tab by mouth daily Current Allergies: ! TALWIN ! METHADONE HCL ! TRANXENE-T ! ADHESIVE TAPE Laboratory Results   Blood Tests      INR: 2.4   (Normal Range: 0.88-1.12   Therap INR: 2.0-3.5)      Orders Added: 1)  Est. Patient Level I [51025] 2)  Protime [85277OE] 3)  EKG w/ Interpretation [93000] 4)  Radiology Referral [Radiology] 5)  Est.  Patient Level IV [42353]     Chief Complaint:  PT check.  History of Present Illness: developed "CP" described as heavinees in the mid ant chest  yesterday 3/10 intensity  no readiation lasted all day assoc w/ increased SOB with exertion no assoc N-V-Diaphoresis sx are gone today   Review of Systems       no fever no cough no LE edma no wheezing   Past Medical History:    Reviewed history from 12/30/2007 and no changes required:       PE and DVT 12-2007       HYPERTENSION        HYPERLIPIDEMIA       GERD (ICD-530.81)       DIABETES MELLITUS, TYPE II (ICD-250.00)       ABNORMAL CT CHEST       REFLEX SYMPATHETIC DYSTROPHY  and Neuropathy       MYOCARDIAL PERFUSION SCAN, WITH STRESS TEST, ABNORMAL (ICD-794.39)       TB SKIN TEST, POSITIVE (ICD-795.5)              Osteoporosis   Physical Exam  General:     alert, well-developed, and overweight-appearing.   Lungs:     normal respiratory effort, no intercostal retractions, no accessory muscle use, and normal breath sounds.   Heart:  normal rate, regular rhythm, no murmur, and no gallop.   Abdomen:     soft, non-tender, no hepatomegaly, and no splenomegaly.   Extremities:     no edema calves symetric ankles symetric   Impression & Recommendations:  Problem # 1:  CHEST PAIN (ICD-786.50) CHART REVIEWED  Stress test 2007--mildly abnormal, ECHO was ok Had a secon stress test aprox 9-08 and saw Dr Jens Som. Was told Stress Test was OK Recent DVT-PE Was Asx until yesterday EKG--essentially at baseline. no acute BMP 12-25-07  Glucose                                  145        h      70-99            mg/dL  BUN                                      6                 6-23             mg/dL  Creatinine                               0.75              0.4-1.2          mg/dL Plan: CT chest--new clot?? ER if sx severe doubt cardiac pain Orders: EKG w/ Interpretation (93000) Radiology Referral  (Radiology)   Complete Medication List: 1)  Singulair 10 Mg Tabs (Montelukast sodium) .Marland Kitchen.. 1 by mouth qd 2)  Meclizine Hcl 25 Mg Tabs (Meclizine hcl) .Marland Kitchen.. 1 by mouth as needed dizzy 3)  Lotrel 5-20 Mg Caps (Amlodipine besy-benazepril hcl) .Marland Kitchen.. 1 by mouth qd 4)  Celebrex 200 Mg Caps (Celecoxib) .Marland Kitchen.. 1 by mouth once daily 5)  Reglan 5 Mg Tabs (Metoclopramide hcl) .... Three times a day as needed 6)  Nexium 40 Mg Cpdr (Esomeprazole magnesium) .... Bid 7)  Amitriptyline Hcl 25 Mg Tabs (Amitriptyline hcl) .... 3 by mouth at bedtime 8)  Allegra 180 Mg Tabs (Fexofenadine hcl) .Marland Kitchen.. 1 by mouth once daily 9)  Enablex 15 Mg Tb24 (Darifenacin hydrobromide) .Marland Kitchen.. 1 by mouth once daily 10)  Oxycodone-acetaminophen 5-500 Mg Caps (Oxycodone-acetaminophen) .... Take 1 to 2 tabs every 4 hours as needed 11)  Coumadin 5 Mg Tabs (Warfarin sodium) .... As directeed 12)  Glucometer and Suplies  .... Check once a day 13)  Freestyle Lite Strp (Glucose blood) .... Check bs two times a day dx 250.00 14)  Freestyle Lancets Misc (Lancets) .... Use two times a day dx 250.00 15)  Trivita Nerve Formula  .... Take by mouth daily 16)  Low-dose Aspirin 81 Mg Tabs (Aspirin) .... Take 1 tablet by mouth once a day 17)  Calcium 600mg  + Vitamin D  .... Take 3 tab by mouth daily  Other Orders: Protime (47829FA)   ]  ANTICOAGULATION RECORD PREVIOUS REGIMEN & LAB RESULTS Anticoagulation Diagnosis:  PE on  01/10/2008  Previous INR:  1.7 on  01/10/2008 Previous Coumadin Dose(mg):  5MG  TABS; CURRENT DOSE 1 PO QD on  01/10/2008 Previous Regimen:  NEW: 1 TAB QD EXCEPT 1 1/2 ON SAT, TUES, THURS on  01/10/2008 Previous Coagulation Comments:  PT TOOK LAST DOSE OF LOVENOX 12/30/07 AM on  01/02/2008  NEW REGIMEN & LAB RESULTS Anticoag. Dx: PE Current INR: 2.4 Current Coumadin Dose(mg): 5mg  tabs Regimen: CONTINUE WITH CURRENT DOSE: 1 PO QD EXCEPT 1 1/2 ON TUES, THURS, & SAT  Provider: [name], MD Repeat testing in: 2 weeks

## 2010-11-22 NOTE — Progress Notes (Signed)
Summary: NEEDS OV REFILL REQUEST  Phone Note Refill Request Call back at [phone] Message from:  Pharmacy on April 12, 2010 8:24 AM  Refills Requested: Medication #1:  MAGIC MOUTH WASH RA- SWISH AND SPIT THREE TIMES A DAY FOR 1 WEEK   Dosage confirmed as above?Dosage Confirmed   Supply Requested: 1 month   Last Refilled: 02/18/2010 [facility] [address]  Next Appointment Scheduled: JUNE 22ND 2011 Initial call taken by: Lavell Islam,  April 12, 2010 8:27 AM  Follow-up for Phone Call        med last filled 02-18-10 with note stating  ov if sxs persist. pt will need OV med can not be refilled with out OV for reevaluation..............Marland KitchenFelecia Deloach CMA  April 12, 2010 10:42 AM  left pt message to call office........Marland KitchenFelecia Deloach CMA  April 12, 2010 10:43 AM  pharmacy inform pt must contact provider to schedule OV.Marti Sleigh Deloach CMA  April 12, 2010 10:45 AM   Additional Follow-up for Phone Call Additional follow up Details #1::        pt aware, pt decline to come in for OV now,pt states that she has pending ov will wait until then.Marland KitchenMarland KitchenFelecia Deloach CMA  April 12, 2010 4:54 PM

## 2010-11-22 NOTE — Letter (Signed)
Summary: EGD Instructions  [facility]  [address] [place], [place] [address]   Phone: [phone]  Fax: [phone]       [name]    02/26/1935    MRN: [id number]       Procedure Day /Date: Friday 12/17/09     Arrival Time:  1:30 pm     Procedure Time: 2:30 pm     Location of Procedure:                    _ x _ [facility] (4th Floor)    PREPARATION FOR ENDOSCOPY   On Friday 12/17/09 THE DAY OF THE PROCEDURE:  1.   No solid foods, milk or milk products are allowed after midnight the night before your procedure.  2.   Do not drink anything colored red or purple.  Avoid juices with pulp.  No orange juice.  3.  You may drink clear liquids until 12:30 pm which is 2 hours before your procedure.                                                                                                CLEAR LIQUIDS INCLUDE: Water Jello Ice Popsicles Tea (sugar ok, no milk/cream) Powdered fruit flavored drinks Coffee (sugar ok, no milk/cream) Gatorade Juice: apple, white grape, white cranberry  Lemonade Clear bullion, consomm, broth Carbonated beverages (any kind) Strained chicken noodle soup Hard Candy   MEDICATION INSTRUCTIONS  Unless otherwise instructed, you should take regular prescription medications with a small sip of water as early as possible the morning of your procedure.  Diabetic patients - see separate instructions.Pt states she is a diabetic,but does not take any meds               OTHER INSTRUCTIONS  You will need a responsible adult at least 74 years of age to accompany you and drive you home.   This person must remain in the waiting room during your procedure.  Wear loose fitting clothing that is easily removed.  Leave jewelry and other valuables at home.  However, you may wish to bring a book to read or an iPod/MP3 player to listen to music as you wait for your procedure to start.  Remove all body piercing jewelry  and leave at home.  Total time from sign-in until discharge is approximately 2-3 hours.  You should go home directly after your procedure and rest.  You can resume normal activities the day after your procedure.  The day of your procedure you should not:   Drive   Make legal decisions   Operate machinery   Drink alcohol   Return to work  You will receive specific instructions about eating, activities and medications before you leave.    The above instructions have been reviewed and explained to me by   Ulis Rias RN  December 14, 2009 2:40 PM    I fully understand and can verbalize these instructions _____________________________ Date _________

## 2010-11-22 NOTE — Progress Notes (Signed)
Summary: returning call   Phone Note Call from Patient Call back at Home Phone [phone]   Caller: Patient Reason for Call: Talk to Nurse Summary of Call: returning call Initial call taken by: Migdalia Dk,  April 14, 2010 10:20 AM  Follow-up for Phone Call        spoke with pt will come in today for repeat labs STAT Dennis Bast, RN, BSN  April 14, 2010 10:45 AM

## 2010-11-22 NOTE — Assessment & Plan Note (Signed)
Summary: discuss several issues/cbs   Vital Signs:  Patient profile:   76 year old female Height:      65 inches (165.10 cm) Weight:      195.13 pounds (88.70 kg) BMI:     32.59 O2 Sat:      96 % on Room air Temp:     97.6 degrees F (36.44 degrees C) oral Pulse rate:   81 / minute BP sitting:   150 / 84  (left arm) Cuff size:   regular  Vitals Entered By: Lucious Groves CMA (September 28, 2010 1:00 PM)  O2 Flow:  Room air CC: Discuss several issues./kb Is Patient Diabetic? Yes Pain Assessment Patient in pain? no      Comments Patient notes that she would like to discuss testing she has had and labs to be done today. She would also like pnemo vacc.   History of Present Illness: ROV Continue with episodes of shortness of breath, status post extensive workup. Likes to know my opinion  Current Medications (verified): 1)  Amlodipine Besy-Benazepril Hcl 5-10 Mg Caps (Amlodipine Besy-Benazepril Hcl) .... One By Mouth Daily 2)  Meclizine Hcl 25 Mg Tabs (Meclizine Hcl) .Marland Kitchen.. 1 By Mouth As Needed Dizzy 3)  Low-Dose Aspirin 81 Mg  Tabs (Aspirin) .... Take 1 Tablet By Mouth Once A Day 4)  Glucometer and Suplies .... Check Once A Day 5)  Freestyle Lite   Strp (Glucose Blood) .... Check Bs Two Times A Day Dx 250.00 6)  Freestyle Lancets   Misc (Lancets) .... Use Two Times A Day Dx 250.00 7)  Trivita Nerve Formula .... Take By Mouth Daily 8)  Lutein 9)  Voltaren Gel .... Apply As Needed To Ankle Per Patient 10)  Vitamin D 2000 Unit Tabs (Cholecalciferol) .... 3 Capsules Daily 11)  Vicodin Hp 10-660 Mg Tabs (Hydrocodone-Acetaminophen) .... One By Mouth Every 4 Hours As Needed For Pain 12)  Icaps Mv  Tabs (Multiple Vitamins-Minerals) .... Once Daily  Allergies (verified): 1)  ! Talwin 2)  ! Methadone Hcl 3)  ! Tranxene-T 4)  ! * ? Antibiotics 5)  ! * Cefazolin 6)  ! Doxycycline 7)  ! * Enablex 8)  ! * Dilaudid 9)  ! Adhesive Tape  Past History:  Past Medical History: PE and DVT  12-2007, after neck surgery, CT chest 10-09 neg for PE , coumadin d/c 10-2008 CP 06-2008: admited, r/o for MI, ECHO normal; symptoms likely GI stress test abnormal 2007, but  normal 03-2009 HYPERTENSION  HYPERLIPIDEMIA Large hiatal hernia repair 01/25/10 DIABETES MELLITUS ?asthma (SOB when attempted  to d/c singulair 11-2008) Pulmonary Nodule , incidental per CT: PET scan 4-9: likely  benign, CT 08-2009 no change, no further CTs( Dr Shelle Iron) REFLEX SYMPATHETIC DYSTROPHY  and Neuropathy Osteoporosis Hemorrhoids h/o IBS h/o colon polyps and DIVERTICULOSIS h/ o +PPD HX of Malignant Colon Polyp in 1994, colonoscopy again 8-10, had a biopsy. Next 2015 Dx w/  early macular degeneration June 2010 (Dr Earlene Plater) Orthostatic intolerance Allergic rhinitis  Past Surgical History: Reviewed history from 04/15/2010 and no changes required. Cholecystectomy Hysterectomy w/o bilateral salpingo-oophorectomy hx of cataract sx  rotator cuff surgery 10-08 Dr Eulah Pont Cspine surgery for OA --fussion, Dr Ophelia Charter (12-18-07) (f/u by a PE) 3th R toe amputation d/t osteomyelitis 11-09 Dr Lajoyce Corners  hiatal hernia surgery, [facility]---(01/25/10)  Review of Systems MS:  continue with left ankle pain. Followup by orthopedic surgery. Endo:  amb.  CBGs were slightly high during the summer when she  was prescribed prednisone for poison ivy. This morning, her CBG was 149.  Physical Exam  General:  alert and well-developed.   Lungs:  normal respiratory effort, no intercostal retractions, and no accessory muscle use.   Heart:  normal rate, regular rhythm, and no murmur.   Extremities:  no pitting edema   Impression & Recommendations:  Problem # 1:  DYSPNEA (ICD-786.05) episodes of dyspnea. Since the last time I saw her 6-11 she had extensive cardiac and neurological workup including a cardiac catheterization, normal diaphragmatic fluoroscopy, reportedly normal MRIs of the neck. She was recommended to start pulmonary  rehabilitation which she is doing Cardiology is [facility]. Unfortunately, I don't have any other suggestions.  after such extensive workup, I told the patient I'm not sure if further w/u will show a clear cut etiology of her symptoms   Problem # 2:  DIABETES MELLITUS, TYPE II (ICD-250.00)  Her updated medication list for this problem includes:    Amlodipine Besy-benazepril Hcl 5-10 Mg Caps (Amlodipine besy-benazepril hcl) ..... One by mouth daily    Low-dose Aspirin 81 Mg Tabs (Aspirin) .Marland Kitchen... Take 1 tablet by mouth once a day  Orders: Venipuncture (32440) TLB-A1C / Hgb A1C (Glycohemoglobin) (83036-A1C) TLB-Microalbumin/Creat Ratio, Urine (82043-MALB) Specimen Handling (10272)  Problem # 3:  OSTEOPOROSIS (ICD-733.00) bone density test 12/10 showed osteoporosis. She declined any medication. Her updated medication list for this problem includes:    Vitamin D 2000 Unit Tabs (Cholecalciferol) .Marland KitchenMarland KitchenMarland KitchenMarland Kitchen 3 capsules daily  Problem # 4:  HEALTH MAINTENANCE EXAM (ICD-V70.0) she is not here for a CPX but we review the chart:  Td < 10 years Have a flu shot already Requests a pneumonia shot--- done  HX of Malignant Colon Polyp in 1994, colonoscopy again 8-10, had a biopsy. Next 2015    saw  gynecologist in 2010, encouraged to go back Last mammogram 07/2009, encouraged to schedule one  Problem # 5:  HYPERLIPIDEMIA (ICD-272.4) based on the last FLP we  recommended treatment. She declined Labs Reviewed: SGOT: 20 (11/19/2009)   SGPT: 28 (11/19/2009)   HDL:47.20 (04/22/2009), 50.5 (06/03/2008)  LDL:DEL (06/03/2008), DEL (05/09/2007)  Chol:204 (04/22/2009), 224 (06/03/2008)  Trig:221.0 (04/22/2009), 266 (06/03/2008)  Complete Medication List: 1)  Amlodipine Besy-benazepril Hcl 5-10 Mg Caps (Amlodipine besy-benazepril hcl) .... One by mouth daily 2)  Meclizine Hcl 25 Mg Tabs (Meclizine hcl) .Marland Kitchen.. 1 by mouth as needed dizzy 3)  Low-dose Aspirin 81 Mg Tabs (Aspirin)  .... Take 1 tablet by mouth once a day 4)  Glucometer and Suplies  .... Check once a day 5)  Freestyle Lite Strp (Glucose blood) .... Check bs two times a day dx 250.00 6)  Freestyle Lancets Misc (Lancets) .... Use two times a day dx 250.00 7)  Trivita Nerve Formula  .... Take by mouth daily 8)  Lutein  9)  Voltaren Gel  .... Apply as needed to ankle per patient 10)  Vitamin D 2000 Unit Tabs (Cholecalciferol) .... 3 capsules daily 11)  Vicodin Hp 10-660 Mg Tabs (Hydrocodone-acetaminophen) .... One by mouth every 4 hours as needed for pain 12)  Icaps Mv Tabs (Multiple vitamins-minerals) .... Once daily  Other Orders: Pneumococcal Vaccine (53664) Admin 1st Vaccine (40347)  Patient Instructions: 1)  Please schedule a follow-up appointment in 4 months .    Orders Added: 1)  Venipuncture [36415] 2)  TLB-A1C / Hgb A1C (Glycohemoglobin) [83036-A1C] 3)  TLB-Microalbumin/Creat Ratio, Urine [82043-MALB] 4)  Specimen Handling [99000] 5)  Pneumococcal Vaccine [90732] 6)  Admin 1st Vaccine [90471] 7)  Est. Patient Level III [[address]]   Immunization History:  Influenza Immunization History:    Influenza:  had one recently per pt  (09/28/2010)  Immunizations Administered:  Pneumonia Vaccine:    Vaccine Type: Pneumovax    Site: right deltoid    Mfr: Merck    Dose: 0.5 ml    Route: IM    Given by: Army Fossa CMA    Exp. Date: 02/22/2012    Lot #: [id number]   Immunization History:  Influenza Immunization History:    Influenza:  had one recently per pt  (09/28/2010)  Immunizations Administered:  Pneumonia Vaccine:    Vaccine Type: Pneumovax    Site: right deltoid    Mfr: Merck    Dose: 0.5 ml    Route: IM    Given by: Army Fossa CMA    Exp. Date: 02/22/2012    Lot #: [id number]

## 2010-11-24 ENCOUNTER — Encounter (HOSPITAL_COMMUNITY): Payer: Medicare Other | Attending: Internal Medicine

## 2010-11-24 DIAGNOSIS — R0989 Other specified symptoms and signs involving the circulatory and respiratory systems: Secondary | ICD-10-CM | POA: Insufficient documentation

## 2010-11-24 DIAGNOSIS — I10 Essential (primary) hypertension: Secondary | ICD-10-CM | POA: Insufficient documentation

## 2010-11-24 DIAGNOSIS — I2789 Other specified pulmonary heart diseases: Secondary | ICD-10-CM | POA: Insufficient documentation

## 2010-11-24 DIAGNOSIS — G9050 Complex regional pain syndrome I, unspecified: Secondary | ICD-10-CM | POA: Insufficient documentation

## 2010-11-24 DIAGNOSIS — Z5189 Encounter for other specified aftercare: Secondary | ICD-10-CM | POA: Insufficient documentation

## 2010-11-24 DIAGNOSIS — E785 Hyperlipidemia, unspecified: Secondary | ICD-10-CM | POA: Insufficient documentation

## 2010-11-24 DIAGNOSIS — E119 Type 2 diabetes mellitus without complications: Secondary | ICD-10-CM | POA: Insufficient documentation

## 2010-11-24 DIAGNOSIS — I2510 Atherosclerotic heart disease of native coronary artery without angina pectoris: Secondary | ICD-10-CM | POA: Insufficient documentation

## 2010-11-24 DIAGNOSIS — R0609 Other forms of dyspnea: Secondary | ICD-10-CM | POA: Insufficient documentation

## 2010-11-24 DIAGNOSIS — K219 Gastro-esophageal reflux disease without esophagitis: Secondary | ICD-10-CM | POA: Insufficient documentation

## 2010-11-29 ENCOUNTER — Encounter (HOSPITAL_COMMUNITY): Payer: Medicare Other

## 2010-12-01 ENCOUNTER — Encounter (HOSPITAL_COMMUNITY): Payer: Medicare Other

## 2010-12-06 ENCOUNTER — Encounter (HOSPITAL_COMMUNITY): Payer: Medicare Other

## 2010-12-07 ENCOUNTER — Ambulatory Visit: Payer: Self-pay | Admitting: Internal Medicine

## 2010-12-07 ENCOUNTER — Encounter: Payer: Self-pay | Admitting: Internal Medicine

## 2010-12-07 ENCOUNTER — Ambulatory Visit (INDEPENDENT_AMBULATORY_CARE_PROVIDER_SITE_OTHER): Payer: Medicare Other | Admitting: Internal Medicine

## 2010-12-07 DIAGNOSIS — R000 Tachycardia, unspecified: Secondary | ICD-10-CM | POA: Insufficient documentation

## 2010-12-07 DIAGNOSIS — R0602 Shortness of breath: Secondary | ICD-10-CM

## 2010-12-08 ENCOUNTER — Encounter (HOSPITAL_COMMUNITY): Payer: Medicare Other

## 2010-12-13 ENCOUNTER — Encounter (HOSPITAL_COMMUNITY): Payer: Medicare Other

## 2010-12-14 ENCOUNTER — Encounter: Payer: Self-pay | Admitting: *Deleted

## 2010-12-15 ENCOUNTER — Encounter: Payer: Self-pay | Admitting: Internal Medicine

## 2010-12-15 ENCOUNTER — Encounter (HOSPITAL_COMMUNITY): Payer: Medicare Other

## 2010-12-20 ENCOUNTER — Encounter: Payer: Self-pay | Admitting: Internal Medicine

## 2010-12-20 ENCOUNTER — Encounter (HOSPITAL_COMMUNITY): Payer: Medicare Other

## 2010-12-20 NOTE — Assessment & Plan Note (Signed)
Summary: 3 mth ROV. sl      Allergies Added:   Visit Type:  3 month follow up Referring Provider:  Dr Johney Frame Primary Provider:  [name], MD  CC:  some-sob and swelling.  History of Present Illness: [name] is a 76 y/o woman with h/o HTN, HL, orthostatic hypotension and RSD. Referred by Dr. Johney Frame for further evaluation of progressive dyspnea.   Had neck surgery 11/2007 and since that time has been getting SOB. Initially found to have PE and treated with coumadin for 1 year. Saw Dr. Shelle Iron end of 2010.  VQ negative for recurrent PE.  CT chest showed small lung nodule but no signifcant parenchymal disease.  Echo 12/10:  EF 65%, grade I diastolic dysfx. No signifcant valvular disease.  In 4/11 found to have moderate to large hiatal hernia with stomach protruding into chest. Had that repaired with no improvement in symptoms.  Referred to Dr. Johney Frame who had concern for possible CAD or platypnea/orthodeoxia. Cath 05/2009 with mild non-obstructive CAD. RHC normal: RA 5 RV 31/2 PA 27/12 (19) PCW 10 CO normal. No evidence of shunt w/sitting up in cath lab. TEE EF 60% trivial MR. Redundant atrial septum. No PFO. Bubble -.  CPX: Spriometry normal. pVO2 15.2 (95% predicted) adjusts to 20.7 when corrected for ideal body weight. ve/vco2 slope markedly elevated at 46. O2 pulse 100%. VE/MVV 56%. Sats 97% throughot exercise felt to be mostly a ventilatory limitation. Cannot exclude concomittant circulatory limitation  Fluoro of diaphragms. R diaphragm elevated at rest but both moved with inspiration. MEP mildly decreased/MIP essentially normal. Also saw Dr. Sandria Manly who did bloodwork, EMG and MRIs and everything seemed OK except for neuropathy.   Going to pulmonary rehab. Feels her SOB is getting better. Still with episodes of peri-oral cyanosis but feels they are less frequent. Bought an oximeter and during those episodes sats are normal but notes HR  goes up to 115. No palpitations. Occasional edema.    Current Medications (verified): 1)  Amlodipine Besy-Benazepril Hcl 5-10 Mg Caps (Amlodipine Besy-Benazepril Hcl) .... One By Mouth Daily 2)  Meclizine Hcl 25 Mg Tabs (Meclizine Hcl) .Marland Kitchen.. 1 By Mouth As Needed Dizzy 3)  Low-Dose Aspirin 81 Mg  Tabs (Aspirin) .... Take 1 Tablet By Mouth Once A Day 4)  Glucometer and Suplies .... Check Once A Day 5)  Freestyle Lite   Strp (Glucose Blood) .... Check Bs Two Times A Day Dx 250.00 6)  Freestyle Lancets   Misc (Lancets) .... Use Two Times A Day Dx 250.00 7)  Trivita Nerve Formula .... Take By Mouth Daily 8)  Lutein 9)  Voltaren Gel .... Apply As Needed To Ankle Per Patient 10)  Vitamin D 2000 Unit Tabs (Cholecalciferol) .... 3 Capsules Daily 11)  Vicodin Hp 10-660 Mg Tabs (Hydrocodone-Acetaminophen) .... One By Mouth Every 4 Hours As Needed For Pain 12)  Icaps Mv  Tabs (Multiple Vitamins-Minerals) .... Once Daily  Allergies (verified): 1)  ! Talwin 2)  ! Methadone Hcl 3)  ! Tranxene-T 4)  ! * ? Antibiotics 5)  ! * Cefazolin 6)  ! Doxycycline 7)  ! * Enablex 8)  ! * Dilaudid 9)  ! Adhesive Tape  Past History:  Past Medical History: Last updated: 09/28/2010 PE and DVT 12-2007, after neck surgery, CT chest 10-09 neg for PE , coumadin d/c 10-2008 CP 06-2008: admited, r/o for MI, ECHO normal; symptoms likely GI stress test abnormal 2007, but  normal 03-2009 HYPERTENSION  HYPERLIPIDEMIA Large hiatal hernia  repair 01/25/10 DIABETES MELLITUS ?asthma (SOB when attempted  to d/c singulair 11-2008) Pulmonary Nodule , incidental per CT: PET scan 4-9: likely  benign, CT 08-2009 no change, no further CTs( Dr Shelle Iron) REFLEX SYMPATHETIC DYSTROPHY  and Neuropathy Osteoporosis Hemorrhoids h/o IBS h/o colon polyps and DIVERTICULOSIS h/ o +PPD HX of Malignant Colon Polyp in 1994, colonoscopy again 8-10, had a biopsy. Next 2015 Dx w/  early macular degeneration June 2010 (Dr Earlene Plater) Orthostatic intolerance Allergic rhinitis  Review of Systems        As per HPI and past medical history; otherwise all systems negative.   Vital Signs:  Patient profile:   76 year old female Height:      65 inches Weight:      198.25 pounds BMI:     33.11 Pulse rate:   78 / minute BP sitting:   116 / 72  (left arm) Cuff size:   large  Vitals Entered By: Caralee Ates CMA (December 07, 2010 12:05 PM)  Physical Exam  General:  elderly. obese woman walks with cane. no resp difficulty HEENT: normal Neck: supple. no JVD. Carotids 2+ bilat; no bruits. No lymphadenopathy or thryomegaly appreciated. Cor: PMI nondisplaced. Regular rate & rhythm. No rubs, gallops, murmur. Lungs: clear Abdomen: obese. soft, nontender, nondistended. No bruits or masses. Good bowel sounds. Extremities: no cyanosis, clubbing, rash, tr edema Neuro: alert & orientedx3, cranial nerves grossly intact. moves all 4 extremities w/o difficulty. strength normal in lower extremities. affect pleasant    Impression & Recommendations:  Problem # 1:  DYSPNEA (ICD-786.05) Improved. Suspect mostly due to deconditioning. No objective finding despite extensive evaluation. Continue pulmonary rehab. Await final report for Dr. Sandria Manly.   Problem # 2:  TACHYCARDIA Will place 2 week event monitor to further evaluate.   Other Orders: EKG w/ Interpretation (93000) Event (Event)  Patient Instructions: 1)  Your physician has recommended that you wear an event monitor.  Event monitors are medical devices that record the heart's electrical activity. Doctors most often use these monitors to diagnose arrhythmias. Arrhythmias are problems with the speed or rhythm of the heartbeat. The monitor is a small, portable device. You can wear one while you do your normal daily activities. This is usually used to diagnose what is causing palpitations/syncope (passing out). 2)  Your physician wants you to follow-up in: 6 months.  You will receive a reminder letter in the mail two months in advance. If you don't receive  a letter, please call our office to schedule the follow-up appointment.

## 2010-12-22 ENCOUNTER — Encounter (HOSPITAL_COMMUNITY): Payer: Medicare Other

## 2010-12-27 ENCOUNTER — Encounter (HOSPITAL_COMMUNITY): Payer: Medicare Other | Attending: Internal Medicine

## 2010-12-27 DIAGNOSIS — K219 Gastro-esophageal reflux disease without esophagitis: Secondary | ICD-10-CM | POA: Insufficient documentation

## 2010-12-27 DIAGNOSIS — Z5189 Encounter for other specified aftercare: Secondary | ICD-10-CM | POA: Insufficient documentation

## 2010-12-27 DIAGNOSIS — E119 Type 2 diabetes mellitus without complications: Secondary | ICD-10-CM | POA: Insufficient documentation

## 2010-12-27 DIAGNOSIS — I2789 Other specified pulmonary heart diseases: Secondary | ICD-10-CM | POA: Insufficient documentation

## 2010-12-27 DIAGNOSIS — G9050 Complex regional pain syndrome I, unspecified: Secondary | ICD-10-CM | POA: Insufficient documentation

## 2010-12-27 DIAGNOSIS — R0989 Other specified symptoms and signs involving the circulatory and respiratory systems: Secondary | ICD-10-CM | POA: Insufficient documentation

## 2010-12-27 DIAGNOSIS — I10 Essential (primary) hypertension: Secondary | ICD-10-CM | POA: Insufficient documentation

## 2010-12-27 DIAGNOSIS — R0609 Other forms of dyspnea: Secondary | ICD-10-CM | POA: Insufficient documentation

## 2010-12-27 DIAGNOSIS — E785 Hyperlipidemia, unspecified: Secondary | ICD-10-CM | POA: Insufficient documentation

## 2010-12-27 DIAGNOSIS — I2510 Atherosclerotic heart disease of native coronary artery without angina pectoris: Secondary | ICD-10-CM | POA: Insufficient documentation

## 2010-12-28 ENCOUNTER — Telehealth: Payer: Self-pay | Admitting: Internal Medicine

## 2010-12-29 ENCOUNTER — Encounter (HOSPITAL_COMMUNITY): Payer: Medicare Other

## 2011-01-03 ENCOUNTER — Encounter (HOSPITAL_COMMUNITY): Payer: Medicare Other

## 2011-01-03 NOTE — Miscellaneous (Signed)
Summary: [facility] Progress Note   [facility] Progress Note   Imported By: Roderic Ovens 12/30/2010 11:12:25  _____________________________________________________________________  External Attachment:    Type:   Image     Comment:   External Document

## 2011-01-03 NOTE — Progress Notes (Signed)
Summary: monitor results   Phone Note Outgoing Call   Call placed by: Meredith Staggers, RN,  December 28, 2010 4:36 PM Call placed to: Patient Summary of Call: Dr Gala Romney reviewed pts monitor, NSR, multiple patient transmissions, no evidence of any arrhythmias, pt is aware

## 2011-01-04 LAB — GLUCOSE, CAPILLARY: Glucose-Capillary: 162 mg/dL — ABNORMAL HIGH (ref 70–99)

## 2011-01-05 ENCOUNTER — Encounter (HOSPITAL_COMMUNITY): Payer: Medicare Other

## 2011-01-06 LAB — POCT I-STAT 3, ART BLOOD GAS (G3+)
Acid-base deficit: 1 mmol/L (ref 0.0–2.0)
Acid-base deficit: 2 mmol/L (ref 0.0–2.0)
Acid-base deficit: 2 mmol/L (ref 0.0–2.0)
Bicarbonate: 23.1 mEq/L (ref 20.0–24.0)
Bicarbonate: 23.2 mEq/L (ref 20.0–24.0)
Bicarbonate: 24.8 mEq/L — ABNORMAL HIGH (ref 20.0–24.0)
O2 Saturation: 68 %
O2 Saturation: 95 %
O2 Saturation: 97 %
TCO2: 24 mmol/L (ref 0–100)
TCO2: 24 mmol/L (ref 0–100)
TCO2: 26 mmol/L (ref 0–100)
pCO2 arterial: 38.2 mmHg (ref 35.0–45.0)
pCO2 arterial: 39.6 mmHg (ref 35.0–45.0)
pCO2 arterial: 44 mmHg (ref 35.0–45.0)
pH, Arterial: 7.359 (ref 7.350–7.400)
pH, Arterial: 7.375 (ref 7.350–7.400)
pH, Arterial: 7.391 (ref 7.350–7.400)
pO2, Arterial: 37 mmHg — CL (ref 80.0–100.0)
pO2, Arterial: 75 mmHg — ABNORMAL LOW (ref 80.0–100.0)
pO2, Arterial: 86 mmHg (ref 80.0–100.0)

## 2011-01-06 LAB — POCT I-STAT 3, VENOUS BLOOD GAS (G3P V)
Acid-base deficit: 1 mmol/L (ref 0.0–2.0)
Acid-base deficit: 1 mmol/L (ref 0.0–2.0)
Acid-base deficit: 1 mmol/L (ref 0.0–2.0)
Acid-base deficit: 2 mmol/L (ref 0.0–2.0)
Acid-base deficit: 2 mmol/L (ref 0.0–2.0)
Bicarbonate: 23.4 mEq/L (ref 20.0–24.0)
Bicarbonate: 23.8 mEq/L (ref 20.0–24.0)
Bicarbonate: 24.5 mEq/L — ABNORMAL HIGH (ref 20.0–24.0)
Bicarbonate: 24.9 mEq/L — ABNORMAL HIGH (ref 20.0–24.0)
Bicarbonate: 25.3 mEq/L — ABNORMAL HIGH (ref 20.0–24.0)
Bicarbonate: 25.4 mEq/L — ABNORMAL HIGH (ref 20.0–24.0)
O2 Saturation: 50 %
O2 Saturation: 58 %
O2 Saturation: 70 %
O2 Saturation: 70 %
O2 Saturation: 71 %
O2 Saturation: 71 %
TCO2: 25 mmol/L (ref 0–100)
TCO2: 25 mmol/L (ref 0–100)
TCO2: 26 mmol/L (ref 0–100)
TCO2: 26 mmol/L (ref 0–100)
TCO2: 27 mmol/L (ref 0–100)
TCO2: 27 mmol/L (ref 0–100)
pCO2, Ven: 41.2 mmHg — ABNORMAL LOW (ref 45.0–50.0)
pCO2, Ven: 42.6 mmHg — ABNORMAL LOW (ref 45.0–50.0)
pCO2, Ven: 42.9 mmHg — ABNORMAL LOW (ref 45.0–50.0)
pCO2, Ven: 44.7 mmHg — ABNORMAL LOW (ref 45.0–50.0)
pCO2, Ven: 44.9 mmHg — ABNORMAL LOW (ref 45.0–50.0)
pCO2, Ven: 46 mmHg (ref 45.0–50.0)
pH, Ven: 7.35 — ABNORMAL HIGH (ref 7.250–7.300)
pH, Ven: 7.353 — ABNORMAL HIGH (ref 7.250–7.300)
pH, Ven: 7.356 — ABNORMAL HIGH (ref 7.250–7.300)
pH, Ven: 7.359 — ABNORMAL HIGH (ref 7.250–7.300)
pH, Ven: 7.362 — ABNORMAL HIGH (ref 7.250–7.300)
pH, Ven: 7.366 — ABNORMAL HIGH (ref 7.250–7.300)
pO2, Ven: 28 mmHg — CL (ref 30.0–45.0)
pO2, Ven: 32 mmHg (ref 30.0–45.0)
pO2, Ven: 38 mmHg (ref 30.0–45.0)
pO2, Ven: 38 mmHg (ref 30.0–45.0)
pO2, Ven: 39 mmHg (ref 30.0–45.0)
pO2, Ven: 39 mmHg (ref 30.0–45.0)

## 2011-01-06 LAB — DIFFERENTIAL
Basophils Absolute: 0 10*3/uL (ref 0.0–0.1)
Basophils Relative: 0 % (ref 0–1)
Eosinophils Absolute: 0.2 10*3/uL (ref 0.0–0.7)
Eosinophils Relative: 3 % (ref 0–5)
Lymphocytes Relative: 33 % (ref 12–46)
Lymphs Abs: 2.2 10*3/uL (ref 0.7–4.0)
Monocytes Absolute: 0.4 10*3/uL (ref 0.1–1.0)
Monocytes Relative: 6 % (ref 3–12)
Neutro Abs: 3.9 10*3/uL (ref 1.7–7.7)
Neutrophils Relative %: 58 % (ref 43–77)

## 2011-01-06 LAB — POCT I-STAT, CHEM 8
BUN: 19 mg/dL (ref 6–23)
Calcium, Ion: 1.14 mmol/L (ref 1.12–1.32)
Chloride: 106 mEq/L (ref 96–112)
Creatinine, Ser: 1 mg/dL (ref 0.4–1.2)
Glucose, Bld: 119 mg/dL — ABNORMAL HIGH (ref 70–99)
HCT: 44 % (ref 36.0–46.0)
Hemoglobin: 15 g/dL (ref 12.0–15.0)
Potassium: 4 mEq/L (ref 3.5–5.1)
Sodium: 141 mEq/L (ref 135–145)
TCO2: 28 mmol/L (ref 0–100)

## 2011-01-06 LAB — CBC
HCT: 41.6 % (ref 36.0–46.0)
Hemoglobin: 13.8 g/dL (ref 12.0–15.0)
MCH: 29.2 pg (ref 26.0–34.0)
MCHC: 33.2 g/dL (ref 30.0–36.0)
MCV: 88.1 fL (ref 78.0–100.0)
Platelets: 189 10*3/uL (ref 150–400)
RBC: 4.72 MIL/uL (ref 3.87–5.11)
RDW: 13.4 % (ref 11.5–15.5)
WBC: 6.7 10*3/uL (ref 4.0–10.5)

## 2011-01-06 LAB — PROTIME-INR
INR: 0.99 (ref 0.00–1.49)
Prothrombin Time: 13.3 seconds (ref 11.6–15.2)

## 2011-01-10 ENCOUNTER — Encounter (HOSPITAL_COMMUNITY): Payer: Medicare Other

## 2011-01-10 NOTE — Procedures (Signed)
Summary: Summary Report  Summary Report   Imported By: Erle Crocker 01/06/2011 13:29:59  _____________________________________________________________________  External Attachment:    Type:   Image     Comment:   External Document

## 2011-01-11 LAB — CBC
HCT: 42.3 % (ref 36.0–46.0)
Hemoglobin: 14.2 g/dL (ref 12.0–15.0)
MCHC: 33.5 g/dL (ref 30.0–36.0)
MCV: 91.3 fL (ref 78.0–100.0)
Platelets: 197 10*3/uL (ref 150–400)
RBC: 4.64 MIL/uL (ref 3.87–5.11)
RDW: 12.4 % (ref 11.5–15.5)
WBC: 7.2 10*3/uL (ref 4.0–10.5)

## 2011-01-11 LAB — COMPREHENSIVE METABOLIC PANEL
ALT: 16 U/L (ref 0–35)
AST: 20 U/L (ref 0–37)
Albumin: 3.8 g/dL (ref 3.5–5.2)
Alkaline Phosphatase: 71 U/L (ref 39–117)
BUN: 13 mg/dL (ref 6–23)
CO2: 28 mEq/L (ref 19–32)
Calcium: 9.7 mg/dL (ref 8.4–10.5)
Chloride: 104 mEq/L (ref 96–112)
Creatinine, Ser: 0.87 mg/dL (ref 0.4–1.2)
GFR calc Af Amer: >60 mL/min (ref >60)
GFR calc non Af Amer: >60 mL/min (ref >60)
Glucose, Bld: 128 mg/dL — ABNORMAL HIGH (ref 70–99)
Potassium: 4.4 mEq/L (ref 3.5–5.1)
Sodium: 139 mEq/L (ref 135–145)
Total Bilirubin: 0.6 mg/dL (ref 0.3–1.2)
Total Protein: 6.9 g/dL (ref 6.0–8.3)

## 2011-01-11 LAB — GLUCOSE, CAPILLARY
Glucose-Capillary: 122 mg/dL — ABNORMAL HIGH (ref 70–99)
Glucose-Capillary: 140 mg/dL — ABNORMAL HIGH (ref 70–99)
Glucose-Capillary: 189 mg/dL — ABNORMAL HIGH (ref 70–99)

## 2011-01-12 ENCOUNTER — Encounter (HOSPITAL_COMMUNITY): Payer: Medicare Other

## 2011-01-13 LAB — CBC
HCT: 41.9 % (ref 36.0–46.0)
Hemoglobin: 14.3 g/dL (ref 12.0–15.0)
MCHC: 34.2 g/dL (ref 30.0–36.0)
MCV: 91 fL (ref 78.0–100.0)
Platelets: 198 10*3/uL (ref 150–400)
RBC: 4.6 MIL/uL (ref 3.87–5.11)
RDW: 13.4 % (ref 11.5–15.5)
WBC: 8.3 10*3/uL (ref 4.0–10.5)

## 2011-01-13 LAB — COMPREHENSIVE METABOLIC PANEL
ALT: 17 U/L (ref 0–35)
AST: 25 U/L (ref 0–37)
Albumin: 3.8 g/dL (ref 3.5–5.2)
Alkaline Phosphatase: 68 U/L (ref 39–117)
BUN: 19 mg/dL (ref 6–23)
CO2: 23 mEq/L (ref 19–32)
Calcium: 9.9 mg/dL (ref 8.4–10.5)
Chloride: 105 mEq/L (ref 96–112)
Creatinine, Ser: 0.97 mg/dL (ref 0.4–1.2)
GFR calc Af Amer: >60 mL/min (ref >60)
GFR calc non Af Amer: 56 mL/min — ABNORMAL LOW (ref >60)
Glucose, Bld: 142 mg/dL — ABNORMAL HIGH (ref 70–99)
Potassium: 4.3 mEq/L (ref 3.5–5.1)
Sodium: 135 mEq/L (ref 135–145)
Total Bilirubin: 0.6 mg/dL (ref 0.3–1.2)
Total Protein: 7.1 g/dL (ref 6.0–8.3)

## 2011-01-13 LAB — DIFFERENTIAL
Basophils Absolute: 0 10*3/uL (ref 0.0–0.1)
Basophils Relative: 0 % (ref 0–1)
Eosinophils Absolute: 0.2 10*3/uL (ref 0.0–0.7)
Eosinophils Relative: 3 % (ref 0–5)
Lymphocytes Relative: 32 % (ref 12–46)
Lymphs Abs: 2.7 10*3/uL (ref 0.7–4.0)
Monocytes Absolute: 0.5 10*3/uL (ref 0.1–1.0)
Monocytes Relative: 6 % (ref 3–12)
Neutro Abs: 4.9 10*3/uL (ref 1.7–7.7)
Neutrophils Relative %: 59 % (ref 43–77)

## 2011-01-13 LAB — POCT CARDIAC MARKERS
CKMB, poc: 4.5 ng/mL (ref 1.0–8.0)
Myoglobin, poc: 107 ng/mL (ref 12–200)
Troponin i, poc: <0.05 ng/mL (ref 0.00–0.09)

## 2011-01-13 LAB — D-DIMER, QUANTITATIVE: D-Dimer, Quant: <0.22 ug/mL-FEU (ref 0.00–0.48)

## 2011-01-13 LAB — BRAIN NATRIURETIC PEPTIDE: Pro B Natriuretic peptide (BNP): <30 pg/mL (ref 0.0–100.0)

## 2011-01-16 LAB — CBC
HCT: 42.6 % (ref 36.0–46.0)
Hemoglobin: 14.2 g/dL (ref 12.0–15.0)
MCHC: 33.4 g/dL (ref 30.0–36.0)
MCV: 91 fL (ref 78.0–100.0)
Platelets: 181 10*3/uL (ref 150–400)
RBC: 4.69 MIL/uL (ref 3.87–5.11)
RDW: 12 % (ref 11.5–15.5)
WBC: 6 10*3/uL (ref 4.0–10.5)

## 2011-01-17 ENCOUNTER — Encounter (HOSPITAL_COMMUNITY): Payer: Medicare Other

## 2011-01-19 ENCOUNTER — Encounter (HOSPITAL_COMMUNITY): Payer: Medicare Other

## 2011-01-20 ENCOUNTER — Other Ambulatory Visit: Payer: Self-pay | Admitting: Internal Medicine

## 2011-01-20 NOTE — Telephone Encounter (Signed)
Ok Lotrel for 6 months Ok Vicodin, #120, 1 RF

## 2011-01-24 ENCOUNTER — Encounter (HOSPITAL_COMMUNITY): Payer: Medicare Other

## 2011-01-26 ENCOUNTER — Encounter (HOSPITAL_COMMUNITY): Payer: Medicare Other

## 2011-01-30 ENCOUNTER — Ambulatory Visit: Payer: Self-pay | Admitting: Internal Medicine

## 2011-01-31 ENCOUNTER — Encounter (HOSPITAL_COMMUNITY): Payer: Medicare Other

## 2011-02-02 ENCOUNTER — Encounter (HOSPITAL_COMMUNITY): Payer: Medicare Other

## 2011-02-05 ENCOUNTER — Inpatient Hospital Stay (INDEPENDENT_AMBULATORY_CARE_PROVIDER_SITE_OTHER)
Admission: RE | Admit: 2011-02-05 | Discharge: 2011-02-05 | Disposition: A | Discharge status: Home / Self Care | Payer: Medicare Other | Source: Ambulatory Visit | Attending: Family Medicine | Admitting: Family Medicine

## 2011-02-05 ENCOUNTER — Ambulatory Visit (INDEPENDENT_AMBULATORY_CARE_PROVIDER_SITE_OTHER): Payer: Medicare Other

## 2011-02-05 DIAGNOSIS — S139XXA Sprain of joints and ligaments of unspecified parts of neck, initial encounter: Secondary | ICD-10-CM

## 2011-02-05 DIAGNOSIS — IMO0002 Reserved for concepts with insufficient information to code with codable children: Secondary | ICD-10-CM

## 2011-02-06 ENCOUNTER — Ambulatory Visit: Payer: Self-pay | Admitting: Internal Medicine

## 2011-02-07 ENCOUNTER — Encounter (HOSPITAL_COMMUNITY): Payer: Medicare Other

## 2011-02-09 ENCOUNTER — Encounter (HOSPITAL_COMMUNITY): Payer: Medicare Other

## 2011-02-14 ENCOUNTER — Encounter (HOSPITAL_COMMUNITY): Payer: Medicare Other

## 2011-02-16 ENCOUNTER — Encounter (HOSPITAL_COMMUNITY): Payer: Medicare Other

## 2011-02-17 ENCOUNTER — Ambulatory Visit: Payer: Self-pay | Admitting: Internal Medicine

## 2011-02-21 ENCOUNTER — Encounter (HOSPITAL_COMMUNITY): Payer: Medicare Other

## 2011-02-23 ENCOUNTER — Encounter (HOSPITAL_COMMUNITY): Payer: Medicare Other

## 2011-02-28 ENCOUNTER — Encounter (HOSPITAL_COMMUNITY): Payer: Medicare Other

## 2011-03-02 ENCOUNTER — Encounter (HOSPITAL_COMMUNITY): Payer: Medicare Other

## 2011-03-07 ENCOUNTER — Encounter (HOSPITAL_COMMUNITY): Payer: Medicare Other

## 2011-03-07 NOTE — Op Note (Signed)
[name], [name]                 ACCOUNT NO.:  [id number]   MEDICAL RECORD NO.:  [id number]          PATIENT TYPE:  OIB   LOCATION:  5029                         FACILITY:  [facility]   PHYSICIAN:  Mark C. Ophelia Charter, M.D.    DATE OF BIRTH:  1935-10-07   DATE OF PROCEDURE:  12/18/2007  DATE OF DISCHARGE:                               OPERATIVE REPORT   PREOPERATIVE DIAGNOSIS:  C4-5, C5-6 cervical spondylosis.   POSTOPERATIVE DIAGNOSIS:  C4-5, C5-6 cervical spondylosis.   PROCEDURE:  C4-C5, C5-C6 anterior cervical diskectomy and fusion, left  iliac crest bone graft (Cloward technique).   SURGEON:  Annell Greening, MD   ASSISTANT:  Maud Deed, PA-C   ANESTHESIA:  GOT plus Marcaine skin local.   DRAINS:  One Hemovac neck.   After induction of general anesthesia, preoperative Ancef and head  halter traction application without any weight, neck was prepped.  A two  pound sandbag was behind the cervical spine and the gel bag behind the  iliac crest.  The arms were tucked carefully with padding to the ulnar  nerve.  Time-out procedure was taken and confirmed per the permit.  Area  was squared with towels, sterile skin marker used in the neck and  Betadine Vi-drape application.  Sterile Mayo stand at the head.  Thyroid  sheet.  Incision was made starting at the midline extending to the left  midway over the planned two-level procedure based on a prominent skin  fold.  Platysma was divided.  Blunt dissection down to the level of the  longus coli muscles, prominent two spurs were noted at 4-5 and 5-6 as  expected, confirmed by the preoperative x-ray and a needle was placed at  the upper level of 4-5 and confirmed with cross-table lateral plain  radiograph.  There was slightly more stenosis at C5-6 than at C4-5 with  more prominent spurs so the 5-6 level was surgically treated first.  Once the disk was excised with a 15 blade, the needle and 4-5 was  removed and the teethed retractors were  placed, Cloward right and left,  smooth placed up and down.  Spurs were removed anteriorly and sequential  drilling back to the posterior cortex using the offset guide.  We placed  the graft slightly inferiorly at the C5-6 level.  Once posterior cortex  was encountered, operative microscope was brought in.  Microdissection  techniques were used with the draped microscope, removing the posterior  spurs making, a keyhole opening, enlarging it and removing the posterior  longitudinal ligament until the dura was decompressed and well  visualized.  Uncovertebral joints were stripped removing the cartilage.  There was room for egress of bone and some bone bleeding and some  epidural bleeding.  After irrigation, the left iliac crest was exposed  using a small incision.  The patient was large and the Cloward extender  was used for a plug cutter.  Fascia was split in line with the fibers  and plug cutter inserted and the plug was harvested.  End was bullet  nosed and as it was being impacted, a  crack occurred in the graft.  It  split between the two cortices and both pieces were removed.  Hole was  drilled in the inner core cortex piece and then 2-0 Vicryl sutures were  used running through both holes tying the plug together in one-piece  bullet nosing it slightly more and then inserting it.  It was stable  with rotation.  The cortex had a crack but the inner piece was all one  solid piece and fit snugly.  Identical procedure was repeated at the 4-5  level, taking spurs down to the posterior cortex using the offset guide,  going slightly cephalad to the disk space and then microdissection  microscope usage for removal of spurs and hypertrophic ligament exposing  the dura.  After decompression, the second plug was taken.  This plug  was relatively thin and it was countersunk 1 to 2 mm based on depth  gauge measurements.  On rotation there was no motion of either plug.  Hemovac was placed through a  separate stab incision in and out  technique.  3-0 Vicryl in the platysma, 4-0 Vicryl subcuticular closure.  Tincture of Benzoin, Steri-Strips, Marcaine infiltration, postop  dressing.  Iliac crest closed 0-0 Vicryl in the fascia, 2-0 on the  subcutaneous tissue, 3 or 4-0 and 4-0 subcuticular closure.  Count was  correct.      Mark C. Ophelia Charter, M.D.  Electronically Signed     MCY/MEDQ  D:  12/18/2007  T:  12/19/2007  Job:  [id number]

## 2011-03-07 NOTE — Letter (Signed)
September 09, 2008    [name], MD  [phone] [place], [place] [address]   RE:  [name], [name]  MRN:  [id number]  /  DOB:  02-13-1935   Dear Dr. Drue Novel:   It was my pleasure of seeing your patient Tierre Gerard in Cardiology  Clinic today.  As you recall, she is a pleasant 76 year old female with  multiple comorbidities including diabetes mellitus, hypertension, and  orthostatic dizziness.  She was recently hospitalized for shortness of  breath and orthostatic dizziness.  An echocardiogram at that time was  unremarkable.  There was no evidence of pulmonary hypertension.  She is  previously known to have pulmonary emboli.  The patient reports that she  continues to have episodes of symptomatic orthostatic dizziness.  She  reports that, perhaps, over the past year that she has had dizziness  upon standing.  She has also had episodes of presyncope associated with  nausea and lightheadedness typically upon standing.  These episodes have  been stable and not recently worsened in frequency or duration.  Upon  recent evaluation in your office, it was felt that the patient possibly  have autonomic neuropathy and she is therefore referred to our clinic  for further evaluation.   PAST MEDICAL HISTORY:  1. Orthostatic dizziness (as above).  2. History of pulmonary embolus in March 2009, chronically      anticoagulated with Coumadin.  3. Hypertension.  4. Hyperlipidemia.  5. Gastroesophageal reflux disease with prior esophageal strictures.  6. Diabetes mellitus.  7. Reflex sympathetic dystrophy.  8. Peripheral neuropathy.  9. Diverticulosis.  10.History of irritable bowel syndrome.  11.Osteoporosis.   SURGERIES:  1. Status post cholecystectomy.  2. Status post total abdominal hysterectomy with bilateral salpingo-      oophorectomy.  3. Status post rotator cuff surgery in October 2008.  4. Status post cervical spine in 2009.   ALLERGIES:  The patient reports allergies to DOXYCYCLINE,  TALWIN, and  CEFAZOLIN.   CURRENT MEDICATIONS:  1. Enablex (recently discontinued)  2. Amitriptyline 75 mg nightly.  3. Lotrel 5/20 mg daily.  4. Celebrex 200 mg daily.  5. Reglan 5 mg t.i.d. a.c.  6. Nexium 20 mg daily.  7. Fexofenadine 180 mg daily.  8. Singulair 10 mg daily.  9. Coumadin to maintain an INR between 2 and 3.  10.Darvocet p.r.n.  11.Meclizine p.r.n.  12.TriVita Nerve Formula.  13.Aspirin 81 mg daily.  14.Calcium plus vitamin D.  15.Multivitamin daily.  16.Boron 3 mg t.i.d.  17.Magnesium 3 teaspoons daily.  18.Vitamin D3 1,000 mg t.i.d.   SOCIAL HISTORY:  The patient is retired from [employer].  She denies tobacco, alcohol, or drug use.   FAMILY HISTORY:  The patient is unable to provide a family history,  though her mother is alive at age 29 and her sister is alive at 68   REVIEW OF SYSTEMS:  All systems are reviewed and negative, except as  outlined above.   PHYSICAL EXAM:  VITALS:  Blood pressure lying is 124/74 with a heart  rate of 96, blood pressure sitting 112/72 with heart rate of 95, and  blood pressure standing 90/70 with a heart rate of 108.  GENERAL:  The patient is a obese female in no acute distress.  She is  alert and oriented x3.  HEENT:  Normocephalic and atraumatic.  Sclerae are clear.  Conjunctivae  are pink.  Oropharynx is clear.  NECK:  Supple.  No JVD, lymphadenopathy, or  bruits.  LUNGS:  Clear to auscultation bilaterally.  HEART:  Regular rate and rhythm.  No murmurs, rubs, or gallops.  GI:  Soft, nontender, and nondistended.  Positive bowel sounds.  EXTREMITIES:  No clubbing, cyanosis, or edema.  NEUROLOGIC:  Strength appears to be intact.  Euthymic mood and full  affect.  MUSCULOSKELETAL:  No deformity or atrophy sites.   EKG; I have reviewed an EKG from July 15, 2008, which reveals sinus  rhythm at 72 beats per minute with a first-degree AV block (PR interval  202 milliseconds), otherwise  unremarkable.   IMPRESSION:  Ms. Sundstrom is a pleasant 76 year old female with multiple  comorbidities including diabetes with peripheral neuropathy,  hypertension, and symptomatic orthostatic hypotension, who presents  today for further evaluation of her orthostatic hypotension.  The  patient was clearly orthostatic in clinic today, but not symptomatic.  I  find it interesting that she does have an elevated heart rate response  upon standing.  Typically with diabetic autonomic neuropathy, I would  expect a blunted heart rate response which is certainly not present  today.  The diagnosis of autonomic dysautonomia is a diagnosis of  exclusion after secondary causes have been ruled out.  I think that Ms.  Ferryman presents with a very interesting, but difficult problem  particularly in the setting of significant peripheral neuropathy.  I  have reviewed her medicine list today and several possible drugs related  to her orthostasis are notable.  Particularly, I have taken the liberty  today to stop her Lotrel which could precipitate orthostatic  hypotension.  Also, I think that we should strongly consider  discontinuing amitriptyline.  I have not discontinued this medication  today, but we will leave this in your judgment.  If she were a candidate  for another medicine for her peripheral neuropathy, this would be  preferred as amitriptyline is highly associated with orthostatic  hypotension.  I think that once we have stopped these medicines, we  should then reevaluate her orthostasis.  Once all secondary causes of  orthostatic hypotension have been addressed, we could consider  initiation of Mestinon 30 mg twice daily and ProAmatine 2.5 mg daily  down the road.  I would not do this today; however, as I think other  options have not been exhausted.   PLAN:  1. Lotrel is discontinued (as above).  The patient is instructed to      follow her blood pressure and contact us should it remain       significantly elevated.  2. Again, I would recommend that you consider discontinuing      amitriptyline.  3. I recommended the patient to wear support stockings to minimize      orthostasis and to perform isometric activities before standing.  4. Adequate hydration is essential in managing the patient's      orthostatic hypotension.  I also      think that we should consider liberalizing her salt intake.  5. I have asked the patient to follow with me in clinic in 6 weeks.   Thank you for the opportunity for participating in the care of Ms.  Hutsell.  Please contact me if you would like to discuss her care  further.    Sincerely,      Hillis Range, MD  Electronically Signed    JA/MedQ  DD: 09/09/2008  DT: 09/10/2008  Job #: [id number]

## 2011-03-07 NOTE — Op Note (Signed)
NAMEAMAIYA, SCRUTON NO.:  [id number]   MEDICAL RECORD NO.:  [id number]          PATIENT TYPE:  AMB   LOCATION:  DSC                          FACILITY:  [facility]   PHYSICIAN:  Loreta Ave, M.D. 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   POSTOPERATIVE DIAGNOSIS:  Right shoulder impingement lateral chromium  ossicle degenerative joint disease acromioclavicular joint and rotator  cuff partial versus complete tear, with complete tear supraspinatus  tendon crescent region.   PROCEDURE:  Right shoulder exam under anesthesia, arthroscopy,  debridement of rotator cuff.  Acromioplasty with excision of ossicle.  Coracoacromial ligament release.  Excision distal clavicle.  Repair  rotator cuff tear with fiber weave suture x2 was swivel lock anchors x2  arthroscopically assisted.   SURGEON:  Loreta Ave, M.D.   ASSISTANT:  Zonia Kief, PA present throughout the entire case.   ANESTHESIA:  General.   BLOOD LOSS:  Minimal.   SPECIMENS:  None.   CULTURES:  None.   COMPLICATIONS:  None.   PROCEDURE:  Soft compressive with shoulder immobilizer.   PROCEDURE:  The patient brought to the operating room and after adequate  anesthesia been obtained, shoulder examined.  Full motion stable  shoulder.  Placed in beach-chair position on shoulder positioner,  prepped and draped usual sterile fashion.  Three portals created,  anterior, posterior lateral.  Shoulder entered with blunt obturator,  arthroscope introduced, the shoulder distended and inspected.  Articular  cartilage labrum, biceps tendon, biceps anchor, capsule ligamentous  structures intact.  Full thickness tear, supraspinatus tendon, minimal  retraction but almost the entire crescent region worse than the front.  Debrided back to healthy tissue.  Still mobile and reparable.  Cannula  redirected subacromially.  Tuberosity roughened for cuff repair.  Type 3  acromion.  Acromioplasty to a type 1 acromion with shaver and high-speed  bur.  Distal clavicle exposed.  Grade 4 changes.  Lateral centimeter  resected.  CA ligament had been released with cautery.  At completion  adequacy of decompression, clavicle excision, debridement confirmed  viewing from all portals.  I then used the scorpion device to capture  the cuff with fiber weave suture x2.  These were brought out the  anterior and posterior portals.  Utilizing the swivel lock device, two  punch holes were then made in the tuberosity.  They had been roughened  down to bleeding bone.  Fiber weave was attached to the swivel lock and  then firmly anchored down the tuberosity of both the anterior and  posterior aspects with a swivel lock.  Posterior most swivel lock had  reasonable bite but the anterior had an excellent bite.  This allowed  complete reattachment and capturing the cuff down to the tuberosity for  a nice firm repair.  Sutures were cut off as they exit out swivel lock.  The repair  was visualized from all portals and felt to be well tensioned  and appropriately repaired with watertight closure.  Instruments and  fluid removed.  Portals closed with nylon.  The larger lateral portal  where the cannula had been put in place for repair also closed with  nylon.  Sterile compressive dressing applied.  Shoulder mobilizer  applied.  Anesthesia reversed.  Brought to recovery room.  Tolerated  surgery well.  No complications.      Loreta Ave, M.D.  Electronically Signed     DFM/MEDQ  D:  07/31/2007  T:  08/01/2007  Job:  [id number]

## 2011-03-07 NOTE — H&P (Signed)
NAMEHASINI, PEACHEY NO.:  [id number]   MEDICAL RECORD NO.:  [id number]          PATIENT TYPE:  INP   LOCATION:  4707                         FACILITY:  [facility]   PHYSICIAN:  Barbette Hair. Artist Pais, DO      DATE OF BIRTH:  Nov 05, 1934   DATE OF ADMISSION:  12/22/2007  DATE OF DISCHARGE:                              HISTORY & PHYSICAL   CHIEF COMPLAINT:  Chest pain.   PRIMARY CARE PHYSICIAN:  [name], M.D.   HISTORY OF PRESENT ILLNESS:  The patient is a 76 year old white female  with recent neck surgery/cervical fusion who experienced acute chest  pain this morning when she got up from her recliner.  She describes  severe 10 out of 10 substernal pain that radiates to her back and  bilateral arms.  She reports she has been sleeping on a recliner due to  recent neck surgery.  She denies lower extremity edema.  She reports  associated mild shortness of breath.  She denies history of previous DVT  but has a remote history of phlebitis.   PAST MEDICAL HISTORY SUMMARY:  1. Hypertension.  2. Allergic rhinitis.  3. Large hiatal hernia.  4. Type 2 diabetes.  5. Irritable bowel syndrome.  6. History of peripheral neuropathy.  7. RSD.  8. History of malignant rectal polyp.   PAST SURGICAL HISTORY:  1. Right shoulder surgery in October, 2008.  2. Recent cervical fusion on December 18, 2007.   SOCIAL HISTORY:  She is divorced.  She lives with her daughter.  She  denies alcohol or tobacco use.   FAMILY HISTORY:  Mother is in her 48's, has mechanical heart valve.  Father's medical history is unknown.  She denies family history of blood  clots.   REVIEW OF SYSTEMS:  She denies estrogen supplements.  She denies history  of bleeding disorder.  She denies history of melena but does have  occasional bleeding hemorrhoids.   CURRENT MEDICATIONS:  1. Amitriptyline 25 mg at bedtime.  2. Celebrex 200 mg once a day.  3. Darvocet as needed.  4. Enablex 15 mg once a day.  5.  Fexofenadine 180 mg once a day.  6. Lotrel 5/20, once a day.  7. Metoclopramide 5 mg t.i.d. p.r.n.  8. Nexium 40 mg b.i.d.  9. Singulair 10 mg once a day.  10.Trimethoprim 100 mg t.i.d. p.r.n.  11.Aspirin 81 mg once a day.  12.Calcium with vitamin D three times a day.   ALLERGIES TO MEDICATIONS:  TALWIN which causes hives.   LABORATORY DATA:  PH was 7.364, pCO2 47.1, bicarb 26.7.  CBC showed  white blood cell count 6.9, H and H 12.5 and 36.9, platelet count  185,000.  D-dimer was elevated at 2.0.  INR was 1.0.  Basic metabolic  profile notable for BUN 9, creatinine 0.9, blood sugar of 200.  Troponin-  I initial value was 0.05, second value 0.08.   CT scan of chest showed acute bilateral pulmonary emboli.  Right 8.5 mm  pulmonary nodule also noted.   PHYSICAL EXAMINATION:  VITAL SIGNS:  Patient is  afebrile.  Blood  pressure is 116/63. Pulse is 99.  Respirations 18.  Oxygen saturation is  98% on 2L.  GENERAL:  The patient is a pleasant, obese, 76 year old white female in  no apparent distress.  HEENT:  Normocephalic, atraumatic. Pupils equal, round, reactive to  light bilaterally.  Extraocular motility was intact.  Patient was  anicteric.  Evidence of previous iridectomy.  NECK:  Supple.  No adenopathy or masses.  CHEST:  Normal respiratory effort.  Clear to auscultation bilaterally.  No rhonchi, rales or wheezing.  CARDIOVASCULAR:  Regular rhythm, tachycardic.  No significant murmurs,  rubs or gallops appreciated.  ABDOMEN:  Protuberant but nontender.  Positive bowel sounds. No  organomegaly.  EXTREMITIES:  No lower extremity edema.  No calf tenderness.  NEUROLOGICAL:  Cranial nerves II-XII grossly intact.  She was nonfocal.   IMPRESSION AND RECOMMENDATIONS:  1. Severe substernal chest pain secondary to bilateral pulmonary      embolism.  2. Recent cervical fusion.  3. An 8.5 mm right pulmonary nodule.  4. Hypertension.  5. Type 2 diabetes.  6. History of reflex  sympathetic dystrophy.  7. History of malignant rectal polyp.  8. Irritable bowel syndrome.  9. Allergic rhinitis.  10.Gastroesophageal reflux disease.   The patient is already started on intravenous heparin in the emergency  room.  Consider delaying start of Coumadin until Wednesday due to recent  neck surgery.  Cycle cardiac enzymes.  She will need further followup  for pulmonary nodule as an outpatient.   Repeat CT scan of abdomen and pelvis with IV and oral contrast, her last  CT was performed in May of 2007, to rule out possibility of intra-  abdominal malignancy.  We will check lower extremity Doppler's.   Monitor her blood sugars and cover with NovoLog sliding scale.      Barbette Hair. Artist Pais, DO  Electronically Signed     RDY/MEDQ  D:  12/22/2007  T:  12/23/2007  Job:  [id number]   cc:   [name], MD

## 2011-03-07 NOTE — Assessment & Plan Note (Signed)
[facility]                            CARDIOLOGY OFFICE NOTE   NAME:Gellatly, Floraine Salena Saner                        MRN:          [id number]  DATE:06/26/2007                            DOB:          01-Jul-1935    HISTORY OF PRESENT ILLNESS:  Ms. Laneve is a 76 year old female who I  last saw in May 2007.  She was complaining of dyspnea.  An  echocardiogram showed high LV feeling pressures, but the LV function was  normal.  Her BNP was 22.  She had a Myoview that showed very mild  anterior ischemia.  However, this could have been related to shifting  breast attenuation.  Given the fact that she was not having any chest  pain, we have elected to follow this.  She has done well.  She does have  dyspnea, occasionally with more extreme activities but not with routine  activities around the house.  There is no orthopnea or PND, but there  can occasionally be mild pedal edema.  She has not had chest pain.  Approximately 1-1/2 weeks ago, she had two episodes where she stood and  became dizzy immediately after standing.  She felt like she was going to  have frank syncope and developed a black sensation in front of her eyes.  However, she did not have frank syncope.  She did state that her  daughter is a Engineer, civil (consulting) and checked her blood pressure in lying and standing  positions and they were lower in the standing positions.  She did not  have chest pain, palpitations or shortness of breath with these  episodes.  She went to the emergency room and her BUN and creatinine  were 32 and 1.69.  Her hemoglobin was 15.3.  She was thought to  potentially have a urinary tract infection.  Her enzymes were negative.  She has not had any further symptoms since then.   CURRENT MEDICATIONS:  1. Lotrel 5/20 daily.  2. Fexofenadine.  3. Nexium.  4. Amitriptyline.  5. Singulair.  6. Celebrex.  7. Nerve formula.  8. Multivitamin.  9. Enablex.  10.Aspirin.  11.Calcium.  12.Metoclopramide.   PHYSICAL EXAMINATION:  VITAL SIGNS:  Blood pressure 146/88, pulse 82,  weight 223 pounds.  HEENT:  Normal.  NECK:  Supple with no bruits.  CHEST:  Clear.  CARDIOVASCULAR:  Regular rhythm.  ABDOMEN:  Benign.  EXTREMITIES:  No edema.   STUDIES:  Electrocardiogram shows a sinus rhythm at a rate of 82.  There  were no ST changes noted.   DIAGNOSES:  1. Recent presyncope/syncopal episode - this appears to be orthostatic      in nature, and this is supported by her elevated BUN and creatinine      ratio when she was in the emergency room.  She has had no further      episodes.  If this becomes more of an issue in the future, then we      could consider decreasing her Lotrel.  Note she does have history      of peripheral neuropathy and this may  be contributing.  Certainly      her supine blood pressure would be higher, but this may alleviate      orthostatic symptoms in the future.  We will follow this and adjust      her medications as indicated.  2. History of mildly abnormal nuclear study.  We will plan to repeat      her Myoview, particularly in light of the fact that she is to have      shoulder surgery in the near future.  3. History of dyspnea.  4. Recently diagnosed diabetes mellitus.  5. Hypertension - as per #1 and #2.  6. Irritable bowel syndrome.  7. History of peripheral neuropathy.   We will see her back in six months.     Madolyn Frieze Jens Som, MD, [facility]  Electronically Signed    BSC/MedQ  DD: 06/26/2007  DT: 06/26/2007  Job #: [id number]

## 2011-03-07 NOTE — Letter (Signed)
October 29, 2008    Mary Ora, MD  [phone] [place], [place] [address]   RE:  [name], Gould  MRN:  [id number]  /  DOB:  1935-05-09   Dear [name],   It was my pleasure to see your patient, [name], in cardiology  followup today.  As you recall, she is a pleasant 76 year old female  with multiple comorbidities including diabetes, hypertension, and  orthostatic dizziness.  She was recently evaluated by me on September 09, 2008, regarding orthostatic presyncope.  She was previously orthostatic  in clinic, but not symptomatic.  We discontinued her Lotrel at that  time.  She also discontinued her Enablex.  Since that time, she has done  very well.  She denies any further episodes of orthostatic dizziness,  presyncope, syncope, shortness of breath, or chest discomfort.  She  reports feeling much better since discontinuing Lotrel and Enablex.  She is otherwise without complaint today since discontinuing Enablex and  Lotrel.  Her primary concerns today continue to be her left ankle pain  and multiple orthopedic ailments.  She is otherwise without complaint.   PROBLEM LIST:  1. Orthostatic dizziness (as above).  2. History of pulmonary embolus in March 2009, chronically      anticoagulated with Coumadin.  3. Hypertension.  4. Hyperlipidemia.  5. Gastroesophageal reflux disease with prior esophageal strictures.  6. Diabetes mellitus.  7. Reflex sympathetic dystrophy.  8. Peripheral neuropathy.  9. Diverticulosis.  10.Irritable bowel syndrome.  11.Osteoporosis.   CURRENT MEDICATIONS:  1. Enablex (recently discontinued).  2. Lotrel (recently discontinued).  3. Fexofenadine 180 mg daily.  4. Nexium 40 mg b.i.d.  5. Amitriptyline 75 mg at bedtime.  6. Singulair 10 mg at bedtime.  7. Celebrex 200 mg daily.  8. Multivitamin daily.  9. Aspirin 81 mg daily.  10.Calcium plus vitamin D daily.  11.Reglan 5 mg t.i.d.  12.Cipro b.i.d. x10 more days.  13.Vitamin D 1000 mg t.i.d.  14.Darvocet-N.  15.Meclizine p.r.n.  16.Forane 3 mg b.i.d.  17.Coumadin to maintain an INR between 2 and 3.   PHYSICAL EXAMINATION:  VITAL SIGNS:  Blood pressure 130/94, heart rate  86, respirations 18, weight 212 pounds.  GENERAL:  The patient is a obese female in no acute distress.  She is  alert and oriented x3 and walks with a cane.  HEENT:  Normocephalic, atraumatic.  Sclerae are clear.  Conjunctivae are  pink.  Oropharynx is clear.  NECK:  Supple.  No JVD, lymphadenopathy, or bruits.  LUNGS:  Clear to auscultation bilaterally.  HEART:  Regular rate and rhythm.  No murmurs, rubs, or gallops.  GI:  Soft, nontender, nondistended.  Positive bowel sounds.  EXTREMITIES:  No clubbing, cyanosis, or edema.  SKIN:  No lesions.   EKG today reveals sinus rhythm at 86 beats per minute, otherwise  unremarkable EKG.   IMPRESSION:  [name] is a pleasant 76 year old female with multiple  comorbidities as outlined above, who presents today for followup after a  recent evaluation of orthostatic hypotension.  It appears that her  symptoms have completely resolved since discontinuing Lotrel and  Enablex.  She is quite pleased with her present health state except for  her longstanding orthopedic issues.  I have, therefore, made no further  recommendations at this time.   PLAN:  1. No further medication changes were made today.  2. The patient should continue her current strategies of adequate      hydration and rising slowly from a  seated position.  3. The patient should continue support stockings to minimize      orthostasis and perform isometric activities before standing.  I      think, it is again reasonable to liberalize her salt intake.  4. The patient is aware that she may followup in my office p.r.n.   Thank you for the opportunity of participating in the care of [name].  I have returned the entirety of her care back to you.  Please  feel free to contact me if I can be of  further assistance in the future.    Sincerely,      Hillis Range, MD  Electronically Signed    JA/MedQ  DD: 10/29/2008  DT: 10/30/2008  Job #: [id number]

## 2011-03-07 NOTE — Assessment & Plan Note (Signed)
[facility]                         GASTROENTEROLOGY OFFICE NOTE   NAME:[name], [name]                        MRN:          [id number]  DATE:06/07/2007                            DOB:          December 18, 1934    [name] is a 76 year old white female whom I have followed for at  least 17 years for dysplastic polyp of the rectum and colon, as well as  for irritable bowel syndrome, diverticulosis, external hemorrhoids and  gastric ulcer.  On her last upper endoscopy in May of 2007 she was found  to have a large hiatal hernia with nonobstructive esophageal stricture  which was not dilated at the time.  She since then has developed solid-  food dysphagia, and has had several episodes of food being stuck, as  well as on one occasion a pill being stuck in her esophagus.  She denies  any odynophagia.  The patient has been on Nexium 40 mg daily.  She is  also on Celebrex 200 mg daily for reflex sympathetic dystrophy.   Other medications include:  1. Calcium.  2. Aspirin.  3. Trimethoprim 100 mg nightly.  4. Enablex 15 mg daily.  5. Metoclopramide 5 mg daily.  6. Celebrex 200 mg p.o. daily.  7. Singulair 10 mg nightly.  8. Amitriptyline 75 mg h.s.  9. Nexium 40 mg daily.  10.Fexofenadine 180 mg p.o. daily.  11.Lotrel 5/20 one p.o. daily.   PHYSICAL EXAMINATION:  Blood pressure 116/62, pulse 88, weight 218  pounds.  She was alert and oriented in no distress.  Voice was normal.  No cough.  LUNGS:  Clear to auscultation.  No rales or wheezes.  COR:  Quiet precordium, normal S1, normal S2.  NECK:  Supple, thyroid not enlarged.  ABDOMEN:  Soft, nontender with normoactive bowel sounds.  RECTAL EXAM:  Not done.   IMPRESSION:  A 76 year old white female with solid-food dysphagia and  known hiatal hernia with mild esophageal stricture.  Her symptoms are  suggestive of recurrent esophageal stricture.  She denies dysphagia to  liquids.  I think she needs to have  an esophageal dilation for relief of  the solid-food dysphagia.   PLAN:  1. Continue Nexium 40 mg per day.  2. Schedule upper endoscopy with esophageal dilation.  3. Anti-reflux measures.  Continue metoclopramide 10 mg h.s.     Hedwig Morton. Juanda Chance, MD  Electronically Signed    DMB/MedQ  DD: 06/07/2007  DT: 06/07/2007  Job #: [id number]   cc:   [name], MD

## 2011-03-07 NOTE — Discharge Summary (Signed)
NAMEPAM, VANALSTINE                 ACCOUNT NO.:  [id number]   MEDICAL RECORD NO.:  [id number]          PATIENT TYPE:  OBV   LOCATION:  4703                         FACILITY:  [facility]   PHYSICIAN:  Valerie A. Felicity Coyer, MDDATE OF BIRTH:  [birth year]   DATE OF ADMISSION:  07/14/2008  DATE OF DISCHARGE:  07/16/2008                               DISCHARGE SUMMARY   DISCHARGE DIAGNOSES:  1. Atypical chest pain.  2. Left-sided back pain with negative urinalysis and normal      creatinine.  3. Hypotension felt secondary to dehydration and antihypertensives.      Lasix and blood pressure medications are currently on hold.  Blood      pressure at time of discharge is 129/56.   HISTORY OF PRESENT ILLNESS:  Ms. Son is a 76 year old female who was  admitted on July 14, 2008, with chief complaint of dizziness off  and on.  She had an episode of chest pain on the day prior to this  admission which radiated to left side of her neck and left arm.  There  was increased chest pain with drinking water.  She was admitted for  further evaluation and treatment.   PAST MEDICAL HISTORY:  1. Hypertension.  2. Allergic rhinitis.  3. Hiatal hernia.  4. Irritable bowel syndrome.  5. Peripheral neuropathy.  6. Malignant rectal polyp.  7. Bilateral PE with left lower extremity DVT, March 2009.  8. Esophageal stricture status post endoscopy in 2008.  9. Chronic Coumadin, managed by Dr. Drue Novel.  10.GERD.   COURSE OF HOSPITALIZATION.:  1. Atypical chest pain.  The patient was admitted and underwent serial      cardiac enzymes which were negative.  A Cardiology consult was      requested, and the patient was seen in consultation by [facility]      Cardiology.  Her serial cardiac enzymes were negative and 2-D echo      noted a left ventricular ejection fraction of 60-65% with normal RV      systolic function and normal pulmonary artery pressure.  She was      maintained on her Coumadin.  INR is 1.9 at time  of discharge.  She      will need continued outpatient monitoring of her Coumadin levels      and was felt by Cardiology that her symptoms were likely GI in      nature.  The patient is well established with Dr. [name] and      plan to seek outpatient followup with Dr. Juanda Chance.  She is currently      eating and drinking without difficulty and has no current GI      complaints.  2. Left-sided back pain.  Question musculoskeletal in origin with a      clean urinalysis and normal creatinine and afebrile, chance of      pyelonephritis is still clinically very low.  However, should she      develop worsening pain, she will need follow up.  We will defer      further workup to the  patient's primary MD.  We will continue      empiric Cipro through July 17, 2008, as planned.  3. Hypotension.  The patient's BP was 80/40 on the day of admission,      is currently stable especially mildly dehydrated in setting of      Lasix and chronic antihypertensives.  We will hold these      medications until follow up with Dr. Drue Novel, defer resuming them to      Dr. Drue Novel.   MEDICATIONS AT TIME OF DISCHARGE:  1. Aspirin 81 mg p.o. daily.  2. Darvocet-N 100 two tablets p.o. t.i.d.  3. Calcium plus D 3 tablets daily.  4. Vitamin D3 three tablets p.o. daily.  5. Boron 3 mg p.o. daily.  6. Trimethoprim twice daily as needed.  7. Cipro 500 mg p.o. b.i.d.  8. Coumadin 7.5 mg p.o. daily except for 5 mg on Tuesdays and Fridays.  9. Singulair 10 mg daily.  10.Meclizine 25 mg p.r.n.  11.Celebrex 200 mg p.o. daily.  12.Reglan 5 mg p.o. t.i.d.  13.Nexium 40 mg p.o. b.i.d.  14.Amitriptyline 75 mg at bedtime.  15.Allegra 180 mg p.o. daily.  16.Enablex 15 mg p.o. daily.   DISPOSITION:  The patient will be discharged to home.   FOLLOWUP:  She is to follow up with Dr. [name] on July 24, 2008, at  10:45 a.m. and with Dr. [name] and contact the office for an  appointment.  She is instructed to call Dr.  Drue Novel for fever greater than  101 or worsening back pain.   PERTINENT LABORATORY DATA:  At time of discharge, UA negative.  INR 1.9.  Serial cardiac enzymes are negative.  BUN 20, creatinine 1.1, hemoglobin  13.9, and hematocrit 41.4.   Greater than 30 minutes was spent on discharge planning.      Sandford Craze, NP      Raenette Rover. Felicity Coyer, MD  Electronically Signed    MO/MEDQ  D:  07/16/2008  T:  07/17/2008  Job:  [id number]   cc:   Hedwig Morton. Juanda Chance, MD  [name], MD

## 2011-03-07 NOTE — Discharge Summary (Signed)
NAMECARLISE, [name]                 ACCOUNT NO.:  [id number]   MEDICAL RECORD NO.:  [id number]          PATIENT TYPE:  INP   LOCATION:  4707                         FACILITY:  [facility]   PHYSICIAN:  Valerie A. Felicity Coyer, MDDATE OF BIRTH:  04-Nov-1934   DATE OF ADMISSION:  12/22/2007  DATE OF DISCHARGE:  12/26/2007                               DISCHARGE SUMMARY   DISCHARGE DIAGNOSES:  1. Acute bilateral pulmonary embolus - left lower extremity deep vein      thrombosis.  2. Diabetes type 2 diabetes.  3. Hypertension.  4. Right pulmonary nodule.  Will need outpatient PET scan; defer to      the patient's primary M.D.  5. History of malignant rectal polyp.  6. Irritable bowel syndrome.  7. Allergic rhinitis..  8. Gastroesophageal reflux disease.  9. History of RSD.   HISTORY OF PRESENT ILLNESS:  [name] is a 76 year old female  admitted on December 22, 2007 with a chief complaint of chest pain.  She  recently underwent neck and cervical fusion and experienced acute chest  pain on the morning of admission when she got up from her recliner,  which she described at 10/10.  She reported that she had been sleeping  in a recliner due to recent neck surgery.  She denies lower extremity  edema.  She reported associated mild shortness of breath.  Denies any  previous history of DVT.  She was noted to have bilateral pulmonary  emboli on CT and was admitted for anticoagulation.   COURSE OF HOSPITALIZATION:  Bilateral PE and left lower extremity DVT.  The patient was admitted.  CT angio of the chest performed on December 22, 2007 revealed acute pulmonary emboli involving both pulmonary arteries.  It also noted a right upper lobe pulmonary nodule, and it was felt per  radiology that they could not exclude a small primary pulmonary neoplasm  and that this needed further workup with either a biopsy or PET scan.  This will need to be arranged as an outpatient.  The patient also  underwent a bilateral  lower extremity venous duplex and was noted to  have a DVT in the left posterior tibial vein.  She was initially treated  with IV heparin which was later converted to Lovenox and placed on  Coumadin.  Her INR at the time of discharge is 1.9.  We will continue a  Lovenox bridge with goal of therapeutic INR overlap of 48 hours prior to  discontinuation.  She is scheduled for a follow-up appointment on Monday  with Dr. [name] for a PT/INR.   MEDICATIONS AT TIME OF DISCHARGE:  1. Singulair 10 mg p.o. daily.  2. Lotrel 5/20 1 tablet p.o. daily.  3. Nexium 40 mg p.o. b.i.d.  4. Allegra 180 mg p.o. daily.  5. Elavil 25 mg p.o. daily at bedtime.  6. Enablex 15 mg p.o. daily.  7. Glyburide 5 mg p.o. daily.  8. Coumadin 5 mg p.o. daily.  9. Lovenox 100 mg subcutaneous injection every 12 hours.  10.Reglan 5 mg three times daily as needed.  11.Aspirin  81 mg p.o. daily.  12.Vicodin 5/500 one tablet p.o. q.6h. as needed.   PERTINENT LABORATORIES AT TIME OF DISCHARGE:  INR 1.9, hemoglobin 9.9,  BUN 6, creatinine 0.75.   FOLLOW UP:  The patient is to follow up with Dr. [name] on Monday,  December 30, 2007 at 11 a.m. and to follow up with Dr. Ophelia Charter and contact the  office for an appointment.  She will need a follow-up CBC, as well as  PT/INR and arrangements for outpatient PET scan at her appointment on  Monday, December 30, 2007, with Dr. [name].      Sandford Craze, NP      Raenette Rover. Felicity Coyer, MD  Electronically Signed    MO/MEDQ  D:  12/26/2007  T:  12/26/2007  Job:  [id number]

## 2011-03-07 NOTE — Op Note (Signed)
[name], [name]                 ACCOUNT NO.:  [id number]   MEDICAL RECORD NO.:  [id number]          PATIENT TYPE:  OIB   LOCATION:  2550                         FACILITY:  [facility]   PHYSICIAN:  Nadara Mustard, MD     DATE OF BIRTH:  07/07/1935   DATE OF PROCEDURE:  08/05/2008  DATE OF DISCHARGE:                               OPERATIVE REPORT   PREOPERATIVE DIAGNOSES:  Osteomyelitis, right foot third toe with Wagner  grade III ulceration.   POSTOPERATIVE DIAGNOSES:  Osteomyelitis, right foot third toe with  Wagner grade III ulceration.   PROCEDURE:  Right foot third ray amputation.   SURGEON:  Nadara Mustard, MD   ANESTHESIA:  General.   ESTIMATED BLOOD LOSS:  Minimal.   ANTIBIOTICS:  2 g of Kefzol.   DRAINS:  None.   COMPLICATIONS:  None.   TOURNIQUET TIME:  None.   DISPOSITION:  To PACU in stable condition.   INDICATION FOR PROCEDURE:  The patient is a 76 year old woman with  chronic ulceration over the tip of the right foot, third toe.  She has  undergone conservative care with pressure unloading, wound debridement,  and p.o. antibiotics, and has persistent bony changes consistent with  osteomyelitis with a chronic draining ulcer.  Due to failure of  conservative care and cellulitis involving the entire toe, the patient  presents at this time for third ray amputation of the right foot.  Risks  and benefits were discussed including persistent infection,  neurovascular injury, nonhealing the wound, and need for additional  surgery.  The patient states he understands and wishes to proceed at  this time.   DESCRIPTION OF PROCEDURE:  The patient was brought to OR room 15 and  underwent a general anesthetic.  After adequate level of anesthesia was  obtained, the patient's right lower extremity was prepped using DuraPrep  and draped into a sterile field.  A racket incision was made dorsally  with no incision going through the plantar aspect of the foot.  The  metatarsal  was resected through the base of the third metatarsal and the  toe and metatarsal were resected in one block of tissue.  Electrocautery  was used for hemostasis.  The wound was irrigated with normal  saline.  The incision was closed using a far-near-near-far suture with 2-  0 nylon.  The wound was covered with Adaptic orthopedic sponges, sterile  Webril, and a Coban dressing.  The patient was then extubated and taken  to PACU in stable condition.  Plan for observation and discharge once  safe with ambulation.      Nadara Mustard, MD  Electronically Signed     MVD/MEDQ  D:  08/05/2008  T:  08/05/2008  Job:  [id number]

## 2011-03-07 NOTE — Consult Note (Signed)
NAMEASMA, BOLDON NO.:  [id number]   MEDICAL RECORD NO.:  [id number]          PATIENT TYPE:  OBV   LOCATION:  4703                         FACILITY:  [facility]   PHYSICIAN:  Marca Ancona, MD      DATE OF BIRTH:  Jun 24, 1935   DATE OF CONSULTATION:  07/15/2008  DATE OF DISCHARGE:                                 CONSULTATION   PRIMARY CARE DOCTOR:  [name], MD.   PRIMARY CARDIOLOGIST:  Madolyn Frieze. Jens Som, MD, [facility].   HISTORY OF PRESENT ILLNESS:  This is a 76 year old admitted with chest  pain and dizziness while being treated for UTI.  She was found to be  hypotensive in her primary care physician's office.  The patient has had  no recent chest pain until Monday.  On Monday, she developed severe  burning substernal chest pain that radiated to her right shoulder and  left arm.  This developed while she was eating a sandwich.  She had  eaten about half a sandwich and then she developed chest pain.  It was  not associated with dysphagia.  There was no nausea or vomiting.  This  chest pain continued for a period of time and then resolved.  Next, the  patient tried drinking some water, and she developed the chest pain  again while drinking the water.  In fact, during the rest of the day,  anytime she would try to sip on any liquids, she would redevelop the  chest pain.  Therefore, the chest pain lasted on and off all day on  Monday.  It does seem to be primarily associated with either eating or  drinking.  It was not exertional.  Yesterday, the patient had no further  chest pain.  She has had no chest pain today.  She did see her primary  care physician yesterday for regular scheduled appointment.  It was  noted that her blood pressure was 80/40.  She had had some  lightheadedness with standing for the last 2 weeks while she was being  treated for UTI.  She was sent to the emergency department.  Also of  note, the patient has had similar episodes of lightheadedness  with  standing thought to be associated with UTI and also with  antihypertensive medications in June 2009 and also in 2008.  She has had  no shortness of breath, no orthopnea, no PND.  She is able to get around  her house without any trouble, and she can climb a flight of stairs.  She did develop bilateral PEs in March 2009 and has been treated with  Coumadin which she has not missed any doses.   PAST MEDICAL HISTORY:  1. Bilateral PEs with left lower extremity DVT in March 2009.  The      patient is on Coumadin for this.  2. History of prior episodes of chest pain.  The patient had a stress      Myoview in 2007 and another stress Myoview in September 2008.  Both      were thought to be normal with some anterior  breast attenuation.  3. Most recent echo in March 2009, EF of 60-65%, mild LVH.  RV      function was mildly decreased.  There was mild RV dilation.      Pulmonary systolic pressure was 39 mmHg.  4. Hypertension.  5. Allergic rhinitis.  6. History of hiatal hernia.  7. Irritable bowel syndrome.  8. Peripheral neuropathy.  9. History of malignant rectal polyp.  10.Esophageal stricture.  This was noted by EGD in 2008.  11.History of C-spine surgery.   SOCIAL HISTORY:  The patient lives in [place] with her daughter.  She  is retired from [employer].  She is divorced.  She does not smoke.  She does not drink alcohol, does not use illicit drugs.   FAMILY HISTORY:  The patient's mother had a mitral valve replaced.  She  does have a history of hypertension.  The patient's father has a history  of hypertension.   REVIEW OF SYSTEMS:  Negative except as noted in the history of present  illness.   MEDICATIONS:  1. Amitriptyline 25 mg t.i.d.  2. Norvasc 5 mg daily.  3. Aspirin 81 mg daily.  4. Benazepril 20 mg daily.  5. Cipro 500 mg b.i.d.  6. Enablex 15 mg daily.  7. Lasix 20 mg daily.  8. Claritin 10 mg daily.  9. Reglan 5 mg t.i.d.  10.Singulair 10 mg daily.   11.Coumadin, by INR.   PHYSICAL EXAMINATION:  VITAL SIGNS:  Temperature is 97.6, pulse 72,  respiratory rate 20, blood pressure 119/73, O2 sat 97% on room air.  GENERAL:  No apparent distress.  HEENT:  Oropharynx is without erythema.  Moist mucous membranes.  ABDOMEN:  Soft, nontender.  No bowel sounds.  No hepatosplenomegaly.  NECK:  Supple without lymphadenopathy.  JVP is about 8-9 cm of water.  HEART:  Regular S1 and S2.  No S3, no S4.  No murmur.  Pulses 2+ and  equal without bruits.  There is trace ankle edema.  EXTREMITIES:  No clubbing or cyanosis.  LUNGS:  Clear to auscultation bilaterally.  NEUROLOGIC:  Alert and oriented x3.  Normal affect.  SKIN:  No rash.  MUSCULOSKELETAL:  Normal.   EKG today, normal sinus rhythm at a rate of 72.  This is a completely  normal EKG.   LABORATORY DATA:  Hematocrit 41.4, platelets 201.  Potassium 4.4, BUN  20, creatinine 1.1, glucose 152.  Cardiac enzymes were negative x3.  INR  is 2.3.   ASSESSMENT AND PLAN:  This is a 76 year old with history of recent PE in  March 2009, who is on Coumadin, who presents with episodes of chest pain  2 days ago and hypertension yesterday.  1. Chest pain.  The patient's EKG today is normal.  Her cardiac      enzymes are negative x3.  She has had no further chest pain today      or yesterday.  The pain was quite atypical.  It occurred at rest      and seemed to be related to either eating or drinking.  She did not      have any dysphagia though.  I wonder if this could be esophageal      spasm.  The patient did have Myoviews done in 2007 and 2008 that      showed no ischemia.  They were read as showing some anterior breast      attenuation.  I would recommend checking an echocardiogram.  I  think one thing to look for given the hypotension and the chest      pain, would be for any evidence of RV strain or significant      pulmonary hypertension that has developed after her bilateral      pulmonary  emboli.  We can also assess her left ventricular wall      motion with the echocardiogram.  I would also involve GI as the      pain was associated with eating or drinking.  It seems like      gastrointestinal pain may be more likely than ischemic pain if her      echocardiogram appears normal.  The patient does have a history of      esophageal stricture, but she does not have any dysphagia now.  2. Hypotension.  The blood pressure was 80/40 yesterday.  It is within      normal range now.  I wonder if this is dehydration in the setting      of the UTI associated with Norvasc and benazepril use.  As      mentioned, her BP is 119/73 today.  She does not appear dehydrated      now.  She has had similar symptoms in the past that were thought to      be related to antihypertensives.  We will check an echocardiogram      to make sure she does not have significant pulmonary hypertension      plus bilateral PEs.  We will hold her Norvasc and her Lasix.      Marca Ancona, MD  Electronically Signed     DM/MEDQ  D:  07/15/2008  T:  07/16/2008  Job:  [id number]

## 2011-03-08 ENCOUNTER — Encounter: Payer: Self-pay | Admitting: Internal Medicine

## 2011-03-08 ENCOUNTER — Ambulatory Visit (INDEPENDENT_AMBULATORY_CARE_PROVIDER_SITE_OTHER): Payer: Medicare Other | Admitting: Internal Medicine

## 2011-03-08 DIAGNOSIS — E119 Type 2 diabetes mellitus without complications: Secondary | ICD-10-CM

## 2011-03-08 DIAGNOSIS — I10 Essential (primary) hypertension: Secondary | ICD-10-CM

## 2011-03-08 DIAGNOSIS — E785 Hyperlipidemia, unspecified: Secondary | ICD-10-CM

## 2011-03-08 DIAGNOSIS — R609 Edema, unspecified: Secondary | ICD-10-CM

## 2011-03-08 DIAGNOSIS — M1990 Unspecified osteoarthritis, unspecified site: Secondary | ICD-10-CM

## 2011-03-08 DIAGNOSIS — R600 Localized edema: Secondary | ICD-10-CM

## 2011-03-08 DIAGNOSIS — Z0000 Encounter for general adult medical examination without abnormal findings: Secondary | ICD-10-CM | POA: Insufficient documentation

## 2011-03-08 DIAGNOSIS — R21 Rash and other nonspecific skin eruption: Secondary | ICD-10-CM

## 2011-03-08 LAB — BASIC METABOLIC PANEL
BUN: 13 mg/dL (ref 6–23)
CO2: 25 mEq/L (ref 19–32)
Calcium: 9.3 mg/dL (ref 8.4–10.5)
Chloride: 105 mEq/L (ref 96–112)
Creatinine, Ser: 0.7 mg/dL (ref 0.4–1.2)
GFR: 81.6 mL/min (ref >60.00)
Glucose, Bld: 107 mg/dL — ABNORMAL HIGH (ref 70–99)
Potassium: 4.7 mEq/L (ref 3.5–5.1)
Sodium: 139 mEq/L (ref 135–145)

## 2011-03-08 LAB — HEMOGLOBIN A1C: Hgb A1c MFr Bld: 6.5 % (ref 4.6–6.5)

## 2011-03-08 MED ORDER — HYDROCODONE-ACETAMINOPHEN 10-660 MG PO TABS: ORAL_TABLET | ORAL | Status: DC

## 2011-03-08 MED ORDER — ZOSTER VACCINE LIVE 19400 UNT/0.65ML ~~LOC~~ SOLR: 0.6500 mL | Freq: Once | SUBCUTANEOUS | Status: DC

## 2011-03-08 MED ORDER — ZOSTER VACCINE LIVE 19400 UNT/0.65ML ~~LOC~~ SOLR: 0.6500 mL | Freq: Once | SUBCUTANEOUS | Status: AC

## 2011-03-08 NOTE — Assessment & Plan Note (Signed)
On diet control, due for labs

## 2011-03-08 NOTE — Assessment & Plan Note (Signed)
BP well-controlled. Labs

## 2011-03-08 NOTE — Assessment & Plan Note (Signed)
Rash of unclear etiology. Recommend dermatology referral

## 2011-03-08 NOTE — Assessment & Plan Note (Signed)
Chronic, left lower extremity edema. See physical exam. Chart is reviewed, she had a negative ultrasound in the left leg August 2011 , no DVT Recommendation to report any worsening of the swelling, increased redness or warmthness

## 2011-03-08 NOTE — Assessment & Plan Note (Signed)
Recommend to return to the office in 4 months for a physical exam. Request is zostavax description today. Done

## 2011-03-08 NOTE — Progress Notes (Signed)
  Subjective:    Patient ID: [name], female    DOB: 10/29/1934, 76 y.o.   MRN: [id number]  HPI Likes to discuss several issues. Has "red spots" since August 2011, in the back, groin, legs. They are not itching except for the ones in the groin. Continue with pain at the left ankle,  this is chronic, requests a refill on Vicodin. Requests a shingles shot Rx. As far as his shortness of breath, she saw cardiology, note is reviewed, they felt it was deconditioning. She has also seen Dr. Sandria Manly, neurology, she was diagnosed with carpal tunnel syndrome according to the patient. Was prescribed Neurontin but she couldn't tolerate d/t edema. Past Medical History  Diagnosis Date  . Hypertension   . Hyperlipidemia   . Diabetes mellitus   . Asthma   . Osteoporosis   . DVT (deep venous thrombosis) 12/2007    PE DVT after neck surgery, coumadin d/c 10-2008  . PE (pulmonary embolism) 12/2007  . Hiatal hernia     s/p repair   . Pulmonary nodule     incidental per CT:  Pet scan 4-9: likely benign, CT 08-2009 no change, no further CTs (Dr. Shelle Iron)  . Hemorrhoids   . IBS (irritable bowel syndrome)   . Colon polyp 1994    malignant  . Diverticulosis   . PPD positive   . Macular degeneration 03/2009    Dr. Earlene Plater  . Allergic rhinitis   . Dyspnea     chronic, extensive w/u see OV note 09-2010  . RSD (reflex sympathetic dystrophy)    Past Surgical History  Procedure Date  . Hiatal hernia repair 01/25/10    with Nissen Fundaplication  . Cholecystectomy   . Total abdominal hysterectomy w/ bilateral salpingoophorectomy   . Cataract extraction   . Rotator cuff repair 07/2007    Dr. Eulah Pont  . Cervical spine surgery 12/18/07    For OA, fusion. Dr. Ophelia Charter:  fu by a PE  . Toe amputation 08/2008    3rd right toe, Dr. Lajoyce Corners due to osteomyelitis      Review of Systems Ambulatory blood pressure was within normal Ambulatory blood sugars between [birth year]1938. Denies any cough or wheezing. Denies any  nausea, vomiting, diarrhea.     Objective:   Physical Exam  Constitutional: She appears well-developed. No distress.  HENT:  Head: Normocephalic and atraumatic.  Cardiovascular: Normal rate, regular rhythm and normal heart sounds.   No murmur heard. Pulmonary/Chest: Effort normal and breath sounds normal. No respiratory distress. She has no wheezes. She has no rales.  Musculoskeletal:       Left lower extremity: Slightly red and warm from the knee down. Chronic finding per patient, states that actually leg looks better than usual.The left calf is half inch larger than the right. Again this is a chronic finding according to the patient. She has multiple varicosities in the lower extremities, no evidence of phlebitis and physical exam.  Skin: She is not diaphoretic.       She has several irregular shape macular slightly red lesions in the lower back, groin, inner aspect of the leg. No scaliness.          Assessment & Plan:

## 2011-03-08 NOTE — Assessment & Plan Note (Addendum)
History of hyperlipidemia, to my knowledge she has never taken statins. She has consistently declined to take any medicines.

## 2011-03-08 NOTE — Assessment & Plan Note (Signed)
Chronic left ankle pain, she had extensive orthopedic evaluations in the past, not a surgical candidate. Refill Vicodin.

## 2011-03-09 ENCOUNTER — Encounter: Payer: Self-pay | Admitting: Internal Medicine

## 2011-03-10 NOTE — Procedures (Signed)
[place]. [facility]  Patient:    [name], [name] Visit Number: [id number] MRN: [id number]          Service Type: MED Location: [phone] Attending Physician:  Judeth Cornfield Dictated by:   Hedwig Morton. Juanda Chance, M.D. [facility] Admit Date:  04/10/2002   CC:         Adolph Pollack, M.D.   Procedure Report  PROCEDURE:  Upper endoscopy.  INDICATIONS:  This 76 year old white female was admitted with acute abdominal pain localized to the right upper quadrant.  Her HIDA scan was positive for biliary dyskinesia.  She also has postprandial nausea and epigastric pain. She is undergoing upper endoscopy as part of her evaluation of the abdominal pain.  She has been on ibuprofen 200 mg on a p.r.n. basis.  ENDOSCOPE:  Fujinon single-channel videoscope.  SEDATION:  Versed 5 mg IV, fentanyl 50 mcg IV.  FINDINGS:  Fujinon single-channel video scope passed under direct vision through posterior pharynx into esophagus.  The patient was monitored by pulse oximeter.  Oxygen saturations were normal.  Proximate and distal esophageal was normal.  There was a mild eccentric esophageal stricture which was nonobstructing and measured about 16 mm in diameter.  It was somewhat angulated.  Distal to the stricture was a 4-cm hiatal hernia which was nonreducible.  Stomach:  The stomach was insufflated with air and showed a normal body of the stomach with normal rugal folds.  The gastric antrum showed two discrete ulcerations.  One was clean based, triangular, also in prepyloric antrum measuring about 1 cm in diameter.  Another ulcer was in the vicinity of the first ulcer and measured about 5-6 mm in diameter.  There was satellite erosions around these ulcers.  Biopsies were taken for CLO test, as well as for H&E stains.  Pyloric outlet was normal.  Duodenum:  Duodenum bulb and descending duodenum were normal.  The endoscope was then brought back into the stomach.  Retroflexion  of endoscope confirmed presence of hiatal hernia.  No other lesions were noted.  IMPRESSION: 1. Two gastric ulcers. 2. Antral gastritis. 3. Status post biopsies. 4. Hiatal hernia with nonobstructing strictures.  Unsuccessful passage of a    48-French Maloney dilator.  PLAN:  The patient has two reasons for abdominal pain; one is a dysfunctional gallbladder, another is gastric ulcers, so the fever and leukocytosis may be more attributed to the cholecystitis.  She will be treated for gastric ulcer with withdrawal of the ibuprofen and use of proton pump inhibitors.  I suggest reendoscopy in 6-8 weeks to document complete healing of the ulcer.  We will also await results of the CLO test. Dictated by:   Hedwig Morton. Juanda Chance, M.D. [facility] Attending Physician:  Judeth Cornfield DD:  04/15/02 TD:  04/16/02 Job: [id number]

## 2011-03-10 NOTE — Op Note (Signed)
[place]. [facility]  Patient:    [name], [name] Visit Number: [id number] MRN: [id number]          Service Type: MED Location: [phone] Attending Physician:  Judeth Cornfield Dictated by:   Adolph Pollack, M.D. Proc. Date: 04/17/02 Admit Date:  04/10/2002 Discharge Date: 04/18/2002   CC:         Hedwig Morton. Juanda Chance, M.D. [facility]  Tinnie Gens C. Quintella Reichert, M.D.   Operative Report  PREOPERATIVE DIAGNOSIS:  Biliary dyskinesia.  POSTOPERATIVE DIAGNOSIS:  Biliary dyskinesia.  PROCEDURE:  Laparoscopic cholecystectomy with intraoperative cholangiogram.  SURGEON:  Adolph Pollack, M.D.  ASSISTANT:  Currie Paris, M.D.  ANESTHESIA:  General.  INDICATIONS:  This is a 76 year old female admitted on the 20th with some right lower abdominal pain and right mid abdominal pain.  She was initially treated with antibiotics as she had a mild fever and elevation of leukocytosis.  Her clinical condition improved, but she continued to have fairly significant postprandial right upper quadrant pain.  Ultrasound was negative for gallstones.  A nuclear medicine hepatobiliary scan with CCK conjunction showed no gallbladder function.  She now presents for cholecystectomy.  DESCRIPTION OF PROCEDURE:  She was placed supine on the operating table and general anesthetic was administered.  The abdomen was sterilely prepped and draped.  Local anesthetic was infiltrated in the infraumbilical area and a small infraumbilical incision was made through the skin and subcutaneous tissue.  A 1 cm to 1.5 cm incision was made in the midline fascia and the peritoneal cavity was entered bluntly and under direct vision.  A pursestring suture of 0 Vicryl was placed around the fascial edges.  A Hasson trocar was introduced into the peritoneal cavity and pneumoperitoneum was created by insufflation of CO2 gas.  Next, a laparoscope was introduced.  The patient was placed in the  appropriate position, and under direct vision, an 11 mm trocar was placed into the abdominal cavity through an epigastric incision and two 5 mm trocars were placed into the abdominal cavity under direct vision.  The fundus of the gallbladder was grasped and filmy adhesions between the transverse colon and duodenum were taken down.  The infundibulum was then grasped and then it was completely mobilized.  I isolated the cystic duct and created a window around it.  I also isolated the anterior branch of the cystic artery and created a window around it.  I placed a clip at the cystic duct gallbladder junction and made a small incision in the cystic duct and was able to milk bile back.  Next, a cholangiocatheter was passed through the anterior abdominal wall through the cystic duct, and a cholangiogram was performed.  Under Real-Time fluoroscopy, dilute contrast material was injected into the cystic duct.  It promptly drained to the common bile duct which promptly drained into the duodenum without any obvious evidence of obstructive phenomenon.  The common right and left hepatic ducts were also noted to fill.  Final reports pending the radiologist interpretation.  A cholangiocatheter was removed.  The cystic duct was clipped three times proximally and divided.  The anterior branch of the cystic artery was clipped and divided.  The posterior branch of the cystic artery was identified, a window created around it, and then it was clipped and divided.  The gallbladder was then dissected free from the liver bed with a small perforation and small amount of bile spillage.  Once it was free of the liver, it was  placed in an Endopouch bag.  The gallbladder fossa was copiously irrigated and bleeding points controlled with the cautery.  Upon reinspection, no further bleeding was noted.  No bile leak was noted.  The fluid was evacuated by way of suction.  Next, I placed the patient in the Trendelenburg  position, and examined the right lower quadrant area.  There is no acute inflammatory process present. The appendix was normal.  I subsequently evacuated more irrigation fluid.  I then removed the gallbladder in the Endopouch bag through the subumbilical port, and under direct vision, tightened up and tied down the pursestring suture, closing the fascial defect.  The 5 mm trocars were removed and one of them had some bleeding to it, but this was controlled with cautery.  The pneumoperitoneum was released and the epigastric trocar removed.  The skin incisions were closed with 4-0 Monocryl subcuticular stitches.  Steri-Strips and sterile dressings were applied.  She tolerated the procedure well without any apparent complications and was taken to the recovery room in satisfactory condition. Dictated by:   Adolph Pollack, M.D. Attending Physician:  Judeth Cornfield DD:  04/17/02 TD:  04/19/02 Job: [id number]

## 2011-03-10 NOTE — H&P (Signed)
[name]. [facility]  Patient:    [name], [name] Visit Number: [id number] MRN: [id number]          Service Type: MED Location: [phone] Attending Physician:  Lorre Nick Dictated by:   Dianah Field, P.A. Admit Date:  04/10/2002                           History and Physical  PHYSICIANS:  Orthopedic, Lunette Stands, M.D.; primary care, Lacretia Leigh. Quintella Reichert, M.D.; GI, Hedwig Morton. Juanda Chance, M.D.; GYN, Andres Ege, M.D.  Right now, patient is in emergency department.  CHIEF COMPLAINT:  Lower abdominal pain.  HISTORY OF PRESENT ILLNESS:  This is a 76 year old white female with a history of IBS and reflux sympathetic dystrophy of the feet.  She has a history of a malignant rectal polyp in 1994 that did not require surgery.  She has had surveillance colonoscopy since then.  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  Patient does suffer from chronic periodic abdominal pain with bloating and cramping which generally tends to be on the left side.  She has alternating constipation and diarrhea all of which are consistent with IBS.  She does say that, periodically, she has more of a stabbing type left upper quadrant pain.  She has had ultrasound ordered by Dr. Juanda Chance at the office and also has had CT scan about a year ago to investigate these chronic abdominal complaints, but nothing has been diagnosed other than the IBS.  Patient now has a three-week history of lower abdominal pain which is more persistent and severe than usual.  This is associated with fatty food intolerance and a dumping type syndrome with her bowels postprandially.  She has had some nausea, but this is not severe.  She denies significant weight loss of more than 5 pounds, but her p.o. intake is reduced.  She has chronic bleeding per rectum from her hemorrhoids and this is unchanged.  She could not get in to see Dr. Juanda Chance so, about two weeks  ago, she went to see Dr. Quintella Reichert. There was microscopic blood on urinalysis and she was treated with a short course of Cipro.  She had a CT scan on March 28, 2002, with oral contrast only. This showed a hiatal hernia and two stones in the left kidney along with constipation and left and sigmoid colon diverticulosis.  No diverticulitis. There was a small right fat-containing inguinal hernia.  Yesterday, the pain got worse.  Pain had been more prominent in the left lower quadrant and left abdomen but, yesterday, it started migrating into the right lower quadrant, right upper quadrant, and into the back.  Nausea with dry heaves became more pronounced.  For relief, she took Darvocet-N 100 which she always has available along with diphenhydramine and, by mistake, took a migraine medication from a sample box which she thought was a GI medication. In any event, the nausea became pronounced and she did not feel any better. She came over to the emergency room.  Temperature was 101.4.  White blood cell count was 15.8.  Urinalysis was unremarkable and liver tests and electrolytes were within normal limits.  She had another CT scan, this time with oral and IV contrast.  This showed small liver and kidney cyst.  No hydronephrosis. Nothing inflammatory in the right lower quadrant.  Appendix was not visualized which is also the case on other CAT  scans.  She is now for HIDA scan.  PAST MEDICAL HISTORY: 1. Reflux sympathetic dystrophy involving both legs below the knees.  Status    post left ankle surgery x 2 and it was the first of these surgeries that    triggered the RSD. 2. Status post partial vaginal hysterectomy. 3. Status post spinal nerve blocks to relieve the RSD done at [facility]. 4. Malignant rectal polyp in 1994, subsequent colonoscopies revealed    hyperplastic polyps and then in 2002, a tubulovillous adenoma with no    high-grade dysplasia. 5. Status post bilateral lower extremity  vein ligation and, later, subsequent    sclerotherapy to the legs in 1994.  Gravida 3, para 3. 6. Degenerative joint disease especially of the pelvis. 7. Status post band ligation of rectal hemorrhoids. 8. Allergies to Talwin.  CURRENT MEDICATIONS: 1. Darvocet-N 100. 2. Ibuprofen 200 mg p.o. p.r.n. 3. Diphenhydramine 25 mg two p.o. at h.s. and then sometimes one to two during    the day. 4. Estradiol 0.5 mg one p.o. q.d. 5. ______  She has samples but really has not been using it except for    yesterday by mistake.  P.R.N. MEDICATIONS:  Bismuth, Gas-X, and meclizine.  She also takes Lotrel, dose unknown.  Will get further details regarding that medication.  SOCIAL HISTORY:  Patient is divorced.  She is disabled by the RSD.  She lives with her daughter.  No tobacco, no alcohol.  FAMILY HISTORY:  A sister had problems with her gallbladder and it was removed.  Sister also had migraine headaches.  Her mother was status post valve replacement and also had stomach and digestive problems.  No anemia, no cancers, no diabetes mellitus.  REVIEW OF SYSTEMS:  Patient essentially has stable weight.  She has been having a lot of malaise and feeling generally unwell.  Appetite is decreased for the last two to three weeks.  PULMONARY:  No cough, no shortness of breath.  Abdominal pain is worsened by deep inspiration as of yesterday.  COR: No palpitations.  Patient has had some chest pain and this has radiated at times into the neck, so she was set up to see Dr. Allyson Sabal in the office next week.  She is currently not having chest pain or associated neck pain.  She denies edema.  GI:  See above.  GU:  No dysuria, frequency, or visualized blood.  MUSCULOSKELETAL:  Has chronic pain in the legs bilaterally but also in the spine and shoulders.  NEUROLOGIC  Rarely has headaches any more but does have a history of migraines when she was younger.  No tremors, no history of seizures.  PHYSICAL  EXAMINATION:  VITAL SIGNS:  Temperature 101.4, pulse 102, respirations 20, blood pressure 156/76.   GENERAL:  Patient is a pleasant, obese white female.  She does not look that unwell.  HEENT:  Negative pallor, negative icterus.  Extraocular movements intact. Oropharynx is moist and clear, dentition in good repair.  NECK:  Without bruits, JVD, masses, or thyromegaly.  CHEST:  Clear to auscultation and percussion bilaterally with good breath sounds.  No cough with deep inspiration.  COR:  There is regular rate and rhythm, no murmurs, rubs, or gallops.  S1, S2 are audible.  ABDOMEN:  Soft.  Bowel sounds are hypoactive.  There is tenderness without guarding or rebound in the right greater than left abdomen in the region of the mid to lower quadrants.  No masses or hepatosplenomegaly appreciated.  RECTAL:  No stool or blood seen on the exam gloves.  There are external hemorrhoids.  A rectal specimen was not guaiaced as there was absolutely no material to guaiac.  NEUROLOGIC:  No confusion, no tremor.  Patient is excellent historian.  Grip is 5/5.  LABORATORY DATA:  A urinalysis shows a small amount of leukocyte esterase, positive ketones, no nitrites.  There are rare epithelial cells, only 3 to 6 white blood cells, and few bacteria.  White blood cell count is 15.8. Hemoglobin 14.2, hematocrit 42.  MCV 92.1.  Platelets 205.  Total bilirubin 0.7, alkaline phosphatase 61, AST 16, ALT 13, albumin 3.6.  Sodium 137, potassium 4.6.  BUN is 15, creatinine 1.0, glucose elevated at 156.  IMPRESSION: 1. Abdominal pain, acute on chronic, which has accelerated over the last three    weeks and accelerated even more over the last 24 hours.  This is associated    now with leukocytosis and fever.  Need to rule out appendicitis.  Need to    rule out gallbladder disease. 2. Leukocytosis and fever. 3. History of irritable bowel syndrome.  With her fever and white count, her    symptoms are  unlikely to be merely secondary to her irritable bowel    syndrome. 4. History of malignant colon polyp with subsequent colonoscopy showing only    recurrence of, at most, adenomatous polyps but no cancers. 5. Reflux sympathetic dystrophy. 6. Hyperglycemia, rule out diabetes mellitus, type 2.  Does have glucose    intolerance obviously.  PLAN: 1. Patient is having HIDA scan at this moment.  She is to be admitted    and treated with a PCA morphine pump, antinauseals as needed, and continued    on some of her oral outpatient medications.  Will start IV fluids.  Plan to    follow up with basic chemistries and CBC in the morning. 2. Start Unasyn empirically. 3. Patient may require a surgical evaluation for general surgery opinion. Dictated by:   Dianah Field, P.A. Attending Physician:  Lorre Nick DD:  04/11/02 TD:  04/11/02 Job: [id number]

## 2011-03-10 NOTE — Discharge Summary (Signed)
   NAMEMILIYAH, LUPER NO.:  [id number]   MEDICAL RECORD NO.:  [id number]                    PATIENT TYPE:   LOCATION:                                       FACILITY:   PHYSICIAN:  Adolph Pollack, M.D.            DATE OF BIRTH:   DATE OF ADMISSION:  04/11/2002  DATE OF DISCHARGE:  04/18/2002                                 DISCHARGE SUMMARY   PRINCIPAL DISCHARGE DIAGNOSIS:  Acute on chronic abdominal pain.   SECONDARY DIAGNOSES:  1. Biliary dyskinesia.  2. Irritable bowel syndrome.  3. Reflex sympathetic dystrophy.  4. Rectal cancer.   PROCEDURE PERFORMED:  Laparoscopic cholecystectomy with intraoperative  cholangiogram April 17, 2002.   REASON FOR ADMISSION:  The patient is a 76 year old female who has some  functional abdominal pain followed by Dr. Juanda Chance. Of note, was that she had  an increase in her chronic abdominal pain and a leukocytosis. She was  admitted by Dr. Arlyce Dice. Please refer to the admission history and physical  for further details.   HOSPITAL COURSE:  Her pain improved as did her white blood cell count.  Urinalysis demonstrated 3-6 white blood cells and a few bacteria. CT scan  demonstrated no inflammatory process present. She continued to slowly  improve. There was a question of biliary dyskinesia and she underwent  compatibility scan with CK injection which indeed substantiated this. She  was subsequently taken for a laparoscopic cholecystectomy, but before this,  she had an upper endoscopy which demonstrated some antral ulcers. She was  then taken to the operating room on April 17, 2002 where a laparoscopic  cholecystectomy with intraoperative cholangiogram which she tolerated well.  She did well and on her first postoperative day she was able to be  discharged.   DISPOSITION:  She was discharged to home in satisfactory condition on April 18, 2002. Diet is limited to low fat and she was given activity  restrictions.  She was told that she should continue to take all of her home  medications and was given some Darvocet and Tylox for pain. She will come  back and see me in about 2-3 weeks and see Dr. Juanda Chance in approximately one  month.                                                Adolph Pollack, M.D.    Kari Baars  D:  06/25/2002  T:  06/26/2002  Job:  [id number]   cc:   Hedwig Morton. Juanda Chance, M.D. [facility]   Cheryle Horsfall, M.D.   Lacretia Leigh. Quintella Reichert, M.D.

## 2011-03-10 NOTE — Op Note (Signed)
NAMEGEORGEANNA, RADZIEWICZ NO.:  [id number]   MEDICAL RECORD NO.:  [id number]          PATIENT TYPE:  AMB   LOCATION:  DSC                          FACILITY:  [facility]   PHYSICIAN:  Loreta Ave, M.D. DATE OF BIRTH:  [birth year]   DATE OF PROCEDURE:  12/01/2004  DATE OF DISCHARGE:                                 OPERATIVE REPORT   PREOPERATIVE DIAGNOSES:  1.  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  2.  Partial versus complete rotator cuff tear.   POSTOPERATIVE DIAGNOSES:  1.  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  2.  Complete rotator cuff tear of anterior half of supraspinatus tendon and      also anterior labrum tear.   PROCEDURES:  1.  Left shoulder examination under anesthesia.  2.  Arthroscopy.  3.  Debridement of labrum and rotator cuff.  4.  Acromioplasty with coracoacromial ligament release.  5.  Excision of distal clavicle.  6.  Mini open repair of rotator cuff tear with FiberWire sutures and Concept      repair system.   SURGEON:  Loreta Ave, M.D.   ASSISTANT:  Genene Churn. Denton Meek.   ANESTHESIA:  General.   BLOOD LOSS:  Minimal.   SPECIMENS:  None.   CULTURES:  None.   COMPLICATIONS:  None.   DRESSING:  Soft compressive with shoulder mobilizer.   DESCRIPTION OF PROCEDURE:  The patient was brought to the operating room and  after adequate anesthesia had been obtained, the left shoulder was examined.  Moderate motion, but some restriction overhead.  Lacked about 20 degrees of  forward flexion.  Abduction achieved with manipulation, breaking mild  adhesions up.  Stable shoulder.  Placed in a beach chair position with a  shoulder positioner.  Prepped and draped in the usual sterile fashion.  Three portals, anterior, posterior and lateral.  Shoulder entered with blunt  obturator, distended and inspected.  The anterior labrum tear was debrided.  The biceps tendon, biceps  anchor, remaining articular cartilage and labrum,  as well as capsular ligamentous structures were intact.  Full-thickness tear  of supraspinatus tendon, chronic in nature, of anterior half.  Some  retraction, but still mobile.  Debrided from below.  Cannula redirected  subacromially.  Chronic impingement type 2 acromion.  Acromioplasty to a  type 1 acromion with chamfer and high-speed bur, releasing the CA ligament  with cautery.  Distal clavicle grade 4 changes with osteolysis.  All spurs  and lateral centimeter of clavicle resected.  I then proceeded with the  Arthrex repair system, putting a 5.5 mm anchor at the attachment site of the  cuff.  Instrument used to weave the cuff through, however, failed and did  not allow good placement of the suture.  There was a small tine missing off  of the front of the suture passer, which was not allowing good passage.  I  did bring in a small C-arm just to be sure that the small portion missing  was not new and had not broken off.  No  foreign bodies were seen.  I  abandoned that approach and utilized small deltoid splitting incision up  through the lateral portal.  The anchor that had been placed was removed and  the sutures removed.  I then weaved a FiberWire suture in the cuff and  brought out through drill holes in the tuberosity, getting a nice firm  watertight closure when those sutures were tied over a bony bridge utilizing  the Concept repair system.  Care was taken to be sure there were no foreign  bodies from the previous instrument and the previous sutures had been  removed.  At the completion, I had a nice firm watertight closure of the  cuff.  Adequacy of decompression had been confirmed viewing from all  portals, as well as digitally.  Wound irrigated.  Deltoids closed with  Vicryl.  Skin and subcutaneous tissues with Vicryl.  Portals were closed  with nylon.  Margins of the wound injected with Marcaine.  Sterile  compressive dressing  and shoulder immobilizer applied.  Anesthesia reversed.  Brought to the recovery room.  Tolerated surgery well.  No complications.      DFM/MEDQ  D:  12/01/2004  T:  12/01/2004  Job:  [id number]

## 2011-03-10 NOTE — Consult Note (Signed)
[facility]  Patient:    [name], KOPP Visit Number: [id number] MRN: [id number]          Service Type: MED Location: [id number] Attending Physician:  Judeth Cornfield Dictated by:   Adolph Pollack, M.D. Proc. Date: 04/11/02 Admit Date:  04/10/2002   CC:         Barbette Hair. Arlyce Dice, M.D. [facility]  Dora M. Juanda Chance, M.D. [facility]  Tinnie Gens C. Quintella Reichert, M.D.   Consultation Report  REQUESTING PHYSICIAN:  Barbette Hair. Arlyce Dice, M.D.  REASON FOR CONSULTATION:  Lower abdominal pain with leukocytosis and fever.  HISTORY OF PRESENT ILLNESS:  Ms. Jumper is a 76 year old female who has chronic intermittent abdominal pains.  She has a known history of irritable bowel syndrome.  Over the past two to three weeks she has had fairly persistent lower quadrant abdominal pain.  However, over the past day to day and a half she has noted more of the pain being in the right mid abdomen, right lower quadrant region with radiation through to the back, even sometimes down to the thigh.  The character of the pain is sharp and comes in waves.  It has been associated with some nausea and a fever.  She subsequently presented to the emergency department and was admitted by Dr. Arlyce Dice.  A CT scan was performed.  Oral contrast was given.  The appendix was not seen.  No acute right lower quadrant or lower abdominal or intra-abdominal inflammatory processes were noted.  She has been noted to have a right inguinal hernia by a CT scan that was done two weeks ago.  She does not complain of diarrhea, dysuria, or vaginal discharge.  She says the pain seems to be relieved somewhat when she lies in the fetal position.  She currently has a morphine PCA and has been started on empiric Unasyn.  PAST MEDICAL HISTORY: 1. Chronic abdominal pain. 2. Malignant rectal polyp. 3. Colonic adenomas. 4. Varicose veins. 5. Degenerative joint disease. 6. RSD. 7. Migraine headaches. 8. Hiatal  hernia.  PREVIOUS OPERATIONS: 1. Vaginal hysterectomy. 2. Deviated septum. 3. Left ankle surgery. 4. Bilateral lower extremity saphenous vein ligation.  ALLERGIES:  TALWIN.  CURRENT MEDICATIONS:  Morphine PCA, Lotrel, Unasyn, Protonix, estradiol, Benadryl, Phenergan.  SOCIAL HISTORY:  She denies tobacco or alcohol use.  REVIEW OF SYSTEMS:  She states she has not had a viral infection as far as she knows in the past two or three weeks.  She does have chronic back pain and had received injections for this.  PHYSICAL EXAMINATION  GENERAL:  Well-developed, well-nourished female in no acute distress, very pleasant and cooperative.  VITAL SIGNS:  Temperature 100.5 with blood pressure 102/51, pulse 86.  ABDOMEN:  Soft and slightly obese with mild suprapubic and right mid abdominal tenderness to palpation.  There is moderate right lower quadrant, right pelvic tenderness to percussion and palpation.  No masses felt.  Active bowel sounds noted.  Her point of maximal tenderness is quite a bit inferior to McBurneys point.  LABORATORY DATA:  White blood cell count 15,800.  Liver function tests are normal.  UA demonstrates 3-6 white blood cells, few bacteria.  CT scan was reviewed with Dr. Geanie Cooley.  The appendix is not seen. However, there is no right lower quadrant or right pelvic inflammatory process and, in fact, there is no intra-abdominal inflammatory process.  A large hiatal hernia is noted.  ASSESSMENT:  Right lower quadrant/right pelvic pain with fever and  leukocytosis.  CT negative for appendicitis.  She has been having the lower abdominal pains for two or three weeks but the right lower quadrant and right pelvic pain have been worse over the past day and a half.  Her differential ds include a mesenteric adenitis versus ovarian torsion versus appendicitis versus nonspecific enteritis.  RECOMMENDATIONS:  I agree with rechecking her CBC in the morning.  We will reexamine  her in the morning.  If she continues to have pain, tenderness, fever, and leukocytosis then she may need diagnostic laparoscopy.  I have explained this to her and her daughter. Dictated by:   Adolph Pollack, M.D. Attending Physician:  Judeth Cornfield DD:  04/11/02 TD:  04/14/02 Job: [id number]

## 2011-04-06 ENCOUNTER — Telehealth: Payer: Self-pay | Admitting: Internal Medicine

## 2011-04-06 NOTE — Telephone Encounter (Signed)
Please put her back on Prilosec 20 mg po bid, #60, 1 refill, and Carafate 1gm po bid, #40, no refill, I think it is going to help.

## 2011-04-06 NOTE — Telephone Encounter (Signed)
Patient had a hiatal hernia repair.  I spoke with the patient's daughter her mother is not home.  Patient is c/o pain in her upper back , she thinks the pain is a sharp stabbing pain.  She gets a little relief if she takes gas-x or phazyme.  She will cry at time from the pain.  She was taken off her PPI after the hiatal hernia repair by Dr Daphine Deutscher.  She has an appt with DB for 05/17/11 they are asking for an earlier appt.  I have suggested that they also contact her surgeon.  Dr Juanda Chance I can work her in with Mike Gip PA? Please advise if needs any additional orders

## 2011-04-07 MED ORDER — OMEPRAZOLE 20 MG PO CPDR: 20.0000 mg | DELAYED_RELEASE_CAPSULE | Freq: Two times a day (BID) | ORAL | Status: DC

## 2011-04-07 MED ORDER — SUCRALFATE 1 G PO TABS: 1.0000 g | ORAL_TABLET | Freq: Two times a day (BID) | ORAL | Status: DC

## 2011-04-07 NOTE — Telephone Encounter (Signed)
I have left a voicemail for the patient with Dr Regino Schultze recommendations. I have asked her to call back if she has any questions.

## 2011-04-20 ENCOUNTER — Other Ambulatory Visit (INDEPENDENT_AMBULATORY_CARE_PROVIDER_SITE_OTHER): Payer: Self-pay | Admitting: Surgery

## 2011-04-20 DIAGNOSIS — R0789 Other chest pain: Secondary | ICD-10-CM

## 2011-04-25 ENCOUNTER — Other Ambulatory Visit: Payer: Medicare Other

## 2011-04-28 ENCOUNTER — Ambulatory Visit
Admission: RE | Admit: 2011-04-28 | Discharge: 2011-04-28 | Disposition: A | Discharge status: Home / Self Care | Payer: Medicare Other | Source: Ambulatory Visit | Attending: Surgery | Admitting: Surgery

## 2011-04-28 ENCOUNTER — Other Ambulatory Visit (INDEPENDENT_AMBULATORY_CARE_PROVIDER_SITE_OTHER): Payer: Self-pay | Admitting: Surgery

## 2011-04-28 DIAGNOSIS — R0789 Other chest pain: Secondary | ICD-10-CM

## 2011-05-16 ENCOUNTER — Telehealth (INDEPENDENT_AMBULATORY_CARE_PROVIDER_SITE_OTHER): Payer: Self-pay | Admitting: Surgery

## 2011-05-17 ENCOUNTER — Ambulatory Visit: Payer: Medicare Other | Admitting: Internal Medicine

## 2011-05-24 ENCOUNTER — Encounter: Payer: Self-pay | Admitting: Internal Medicine

## 2011-05-31 ENCOUNTER — Ambulatory Visit (INDEPENDENT_AMBULATORY_CARE_PROVIDER_SITE_OTHER): Payer: Medicare Other | Admitting: Internal Medicine

## 2011-05-31 ENCOUNTER — Encounter: Payer: Self-pay | Admitting: Internal Medicine

## 2011-05-31 DIAGNOSIS — R933 Abnormal findings on diagnostic imaging of other parts of digestive tract: Secondary | ICD-10-CM

## 2011-05-31 DIAGNOSIS — R1319 Other dysphagia: Secondary | ICD-10-CM

## 2011-05-31 NOTE — Progress Notes (Signed)
[name] September 15, 1935 MRN [id number]    History of Present Illness:  This is a 76 year old white female with multiple medical problems, who is status post hiatal hernia repair by Dr. Daphine Deutscher in April 2011. Patient is now complaining of interscapular pain occurring several times a week which is not positional. She has somewhat improved on antacids, Carafate and Prilosec. A recent upper GI series showed decreased primary peristaltic waves, slight distention of the thoracic esophagus and tertiary contractions. A 13 mm tablet passed with a slight delay at the level of the Nissen fundoplication. She is also being seen by Dr Lajoyce Corners for low back pain. A recent MRI of the back on 01/19/2011 showed degenerative changes of the lumbosacral and thoracic spine. The disc bulges at T10-11 without impingement. The patient herself cannot identify whether the pain is GI-related or musculoskeletal. She has had some constipation.   Past Medical History  Diagnosis Date  . Hypertension   . Hyperlipidemia   . Diabetes mellitus   . Asthma   . Osteoporosis   . DVT (deep venous thrombosis) 12/2007    PE DVT after neck surgery, coumadin d/c 10-2008  . PE (pulmonary embolism) 12/2007  . Hiatal hernia     s/p repair   . Pulmonary nodule     incidental per CT:  Pet scan 4-9: likely benign, CT 08-2009 no change, no further CTs (Dr. Shelle Iron)  . Hemorrhoids   . IBS (irritable bowel syndrome)   . Colon polyp 1994    malignant  . Diverticulosis   . PPD positive   . Macular degeneration 03/2009    Dr. Earlene Plater  . Allergic rhinitis   . Dyspnea     chronic, extensive w/u see OV note 09-2010  . RSD (reflex sympathetic dystrophy)   . Hx of colonoscopy   . DJD (degenerative joint disease)    Past Surgical History  Procedure Date  . Hiatal hernia repair 01/25/10    with Nissen Fundaplication  . Cholecystectomy   . Total abdominal hysterectomy w/ bilateral salpingoophorectomy   . Cataract extraction   . Rotator cuff repair  07/2007    Dr. Eulah Pont  . Cervical spine surgery 12/18/07    For OA, fusion. Dr. Ophelia Charter:  fu by a PE  . Toe amputation 08/2008    3rd right toe, Dr. Lajoyce Corners due to osteomyelitis    reports that she has never smoked. She has never used smokeless tobacco. She reports that she does not drink alcohol or use illicit drugs. family history includes Heart disease in her mother; Hypertension in an unspecified family member; Migraines in an unspecified family member; and Rheum arthritis in her mother.  There is no history of Colon cancer. Allergies  Allergen Reactions  . Clorazepate Dipotassium   . Darifenacin Hydrobromide     REACTION: hypotension, near syncope  . Doxycycline     REACTION: RASH  . Hydromorphone Hcl     REACTION: confused, intense itching  . Methadone Hcl   . Pentazocine Lactate         Review of Systems: Positive for occasional dysphagia. Denies heartburn. Positive for constipation. Negative for rectal bleeding The remainder of the 10  point ROS is negative except as outlined in H&P   Physical Exam: General appearance  Well developed, in no distress. Eyes- non icteric. HEENT nontraumatic, normocephalic. Mouth no lesions, tongue papillated, no cheilosis. Neck supple without adenopathy, thyroid not enlarged, no carotid bruits, no JVD. Lungs Clear to auscultation bilaterally. Cor normal S1 normal  S2, regular rhythm , no murmur,  quiet precordium. Abdomen soft abdomen with a relaxed all muscle tone. No specific tenderness. Normoactive bowel sounds. Rectal: External hemorrhoids. Normal rectal sphincter tone. Stool is Hemoccult negative. Extremities no pedal edema, she has RSD, walks with a cane. Skin no lesions. Neurological alert and oriented x 3. Psychological normal mood and affect.  Assessment and Plan:  Problem #1 interscapular pain which is musculoskeletal versus GI related. She has abnormal esophageal motility in that some of her symptoms are improved on acid  suppressing agents. We will proceed with an upper endoscopy. She will continue Carafate and Prilosec for now as well.  Problem #2 thoracic and lumbosacral spine disease. This is followed by Dr.Duda. She will need further follow up.  Problem #3 History of colon polyps. She is up-to-date on her colonoscopy. She is to continue milk of magnesia 30-45 cc when necessary for constipation.      05/31/2011 [name]

## 2011-05-31 NOTE — Patient Instructions (Addendum)
You have been scheduled for an endoscopy. Please follow written instructions given to you at your visit today. CC: Dr Drue Novel, Dr Lajoyce Corners

## 2011-06-01 ENCOUNTER — Encounter: Payer: Self-pay | Admitting: Internal Medicine

## 2011-06-01 ENCOUNTER — Ambulatory Visit (AMBULATORY_SURGERY_CENTER): Payer: Medicare Other | Admitting: Internal Medicine

## 2011-06-01 ENCOUNTER — Other Ambulatory Visit: Payer: Self-pay | Admitting: *Deleted

## 2011-06-01 DIAGNOSIS — K2100 Gastro-esophageal reflux disease with esophagitis, without bleeding: Secondary | ICD-10-CM

## 2011-06-01 DIAGNOSIS — K2961 Other gastritis with bleeding: Secondary | ICD-10-CM

## 2011-06-01 DIAGNOSIS — R1319 Other dysphagia: Secondary | ICD-10-CM

## 2011-06-01 DIAGNOSIS — K319 Disease of stomach and duodenum, unspecified: Secondary | ICD-10-CM

## 2011-06-01 DIAGNOSIS — K2960 Other gastritis without bleeding: Secondary | ICD-10-CM

## 2011-06-01 DIAGNOSIS — R933 Abnormal findings on diagnostic imaging of other parts of digestive tract: Secondary | ICD-10-CM

## 2011-06-01 LAB — GLUCOSE, CAPILLARY
Glucose-Capillary: 127 mg/dL — ABNORMAL HIGH (ref 70–99)
Glucose-Capillary: 94 mg/dL (ref 70–99)

## 2011-06-01 MED ORDER — SUCRALFATE 1 G PO TABS: 1.0000 g | ORAL_TABLET | Freq: Two times a day (BID) | ORAL | Status: DC

## 2011-06-01 MED ORDER — SODIUM CHLORIDE 0.9 % IV SOLN: 500.0000 mL | INTRAVENOUS | Status: DC

## 2011-06-01 MED ORDER — OMEPRAZOLE 20 MG PO CPDR: 20.0000 mg | DELAYED_RELEASE_CAPSULE | Freq: Two times a day (BID) | ORAL | Status: DC

## 2011-06-01 MED ORDER — DIPHENHYDRAMINE HCL 50 MG/ML IJ SOLN: 25.0000 mg | Freq: Once | INTRAMUSCULAR | Status: DC

## 2011-06-01 NOTE — Telephone Encounter (Signed)
Dr Juanda Chance would like patient to have refills on carafate and omeprazole. Rx's have been sent to pharmacy.

## 2011-06-01 NOTE — Patient Instructions (Signed)
Please read the handout given to you by your recovery room nurse.  You do have mild gastritis, and Dr. Juanda Chance did biopsy your esophagus.   You will receive the biopsy results by mail within two weeks.  Please, continue your antiacid medicine and your carafate.  Follow up with Dr. Lajoyce Corners regarding your disc disease per Dr. Juanda Chance.    Resume your routine medications today.  If you have any questions,  Call us at [phone].  Thank-you.

## 2011-06-02 ENCOUNTER — Telehealth: Payer: Self-pay

## 2011-06-02 MED ORDER — SUCRALFATE 1 G PO TABS: 1.0000 g | ORAL_TABLET | Freq: Two times a day (BID) | ORAL | Status: DC

## 2011-06-02 NOTE — Telephone Encounter (Signed)
Answered patients questions. Prescription sent.

## 2011-06-02 NOTE — Telephone Encounter (Signed)

## 2011-06-08 ENCOUNTER — Encounter: Payer: Self-pay | Admitting: Internal Medicine

## 2011-06-15 ENCOUNTER — Telehealth: Payer: Self-pay | Admitting: Internal Medicine

## 2011-06-15 NOTE — Telephone Encounter (Signed)
The food is being lodged because she has an esophageal dysmotility on UGI series recently. She needs to  [facility] carefully, and cut back on her pain meds which may be contributing to the sluggish motility

## 2011-06-15 NOTE — Telephone Encounter (Signed)
Patient calling to report that she did okay for a week after her EGD on 06/01/11. Then she started having difficulty with nausea, food does not go down easily and she vomits. Her pills are getting lodged in her throat especially the Carafate. She is breaking it in half and taking it with bread. She is taking her Prilosec BID. She is at her sons house in [place] now. If the number she gave [place] is busy, may call her home number and leave a message.Please, advise.

## 2011-06-16 NOTE — Telephone Encounter (Signed)
Left a message for patient to call me at home number and her son's number.

## 2011-06-16 NOTE — Telephone Encounter (Signed)
Spoke with patient and gave her Dr. Brodie's recommendations. 

## 2011-06-21 ENCOUNTER — Telehealth: Payer: Self-pay | Admitting: Internal Medicine

## 2011-06-21 NOTE — Telephone Encounter (Signed)
I prefer for her to have an appointment because I will have to review her chart in order to discuss her concerns. She has a complex history.

## 2011-06-21 NOTE — Telephone Encounter (Signed)
Spoke with patients daughter Angelique Blonder and went over the instructions I gave her mother on 06/15/11 as per Dr. Juanda Chance. Patient's daughter states that her mother does not remember the things Dr. Juanda Chance told her after there procedure and is asking her questions she is not able to answer. She would like to have her mother see Dr. Juanda Chance or have Dr. Juanda Chance call her mother. Patient is aware that Dr.Brodie is not in the office this week. Dr. Juanda Chance, which would you prefer. Please, advise.

## 2011-06-22 NOTE — Telephone Encounter (Signed)
Spoke with Angelique Blonder and scheduled patient on 07/17/11 at 2:00 PM.

## 2011-06-23 ENCOUNTER — Ambulatory Visit: Payer: Medicare Other | Admitting: Internal Medicine

## 2011-07-03 ENCOUNTER — Other Ambulatory Visit: Payer: Self-pay | Admitting: Internal Medicine

## 2011-07-04 NOTE — Telephone Encounter (Signed)
Hydrocodone-APAP request [last refill 03/08/11 #[id number]]

## 2011-07-04 NOTE — Telephone Encounter (Signed)
120, 3 RF

## 2011-07-05 NOTE — Telephone Encounter (Signed)
Rx Done . 

## 2011-07-11 ENCOUNTER — Ambulatory Visit (INDEPENDENT_AMBULATORY_CARE_PROVIDER_SITE_OTHER): Payer: Medicare Other | Admitting: Internal Medicine

## 2011-07-11 ENCOUNTER — Encounter: Payer: Self-pay | Admitting: Internal Medicine

## 2011-07-11 DIAGNOSIS — I10 Essential (primary) hypertension: Secondary | ICD-10-CM

## 2011-07-11 DIAGNOSIS — M1990 Unspecified osteoarthritis, unspecified site: Secondary | ICD-10-CM

## 2011-07-11 DIAGNOSIS — R21 Rash and other nonspecific skin eruption: Secondary | ICD-10-CM

## 2011-07-11 NOTE — Progress Notes (Signed)
  Subjective:    Patient ID: [name], female    DOB: [birth year], 76 y.o.   MRN: [id number]  HPI  OV took place while all computers were down, unable to do CPX today. Her main concern today his neck pain, she had MRI last week, diagnosed with neck stenosis and has been refer for a local steroid injection. Also he has thoracic back pain, status post GI evaluation as she thought it was "gas" endoscopies were done and she was diagnosed with esophageal dysmotility.  Past Medical History  Diagnosis Date  . Hypertension   . Hyperlipidemia   . Diabetes mellitus   . Asthma   . Osteoporosis   . DVT (deep venous thrombosis) 12/2007    PE DVT after neck surgery, coumadin d/c 10-2008  . PE (pulmonary embolism) 12/2007  . Hiatal hernia     s/p repair   . Pulmonary nodule     incidental per CT:  Pet scan 4-9: likely benign, CT 08-2009 no change, no further CTs (Dr. Shelle Iron)  . Hemorrhoids   . IBS (irritable bowel syndrome)   . Colon polyp 1994    malignant  . Diverticulosis   . PPD positive   . Macular degeneration 03/2009    Dr. Earlene Plater  . Allergic rhinitis   . Dyspnea     chronic, extensive w/u see OV note 09-2010  . RSD (reflex sympathetic dystrophy)   . Hx of colonoscopy   . DJD (degenerative joint disease)    Past Surgical History  Procedure Date  . Hiatal hernia repair 01/25/10    with Nissen Fundaplication  . Cholecystectomy   . Total abdominal hysterectomy w/ bilateral salpingoophorectomy   . Cataract extraction   . Rotator cuff repair 07/2007    Dr. Eulah Pont  . Cervical spine surgery 12/18/07    For OA, fusion. Dr. Ophelia Charter:  fu by a PE  . Toe amputation 08/2008    3rd right toe, Dr. Lajoyce Corners due to osteomyelitis    Review of Systems Hypertension, good medication compliance, no ambulatory blood pressures. As far as the neck pain, she denies any bladder or bowel incontinence, she has some paresthesias in the hands, has seen Dr. Sandria Manly before on diagnosed with carpal tunnel  syndrome.     Objective:   Physical Exam  Constitutional: She is oriented to person, place, and time. She appears well-developed and well-nourished.  HENT:  Head: Normocephalic.  Cardiovascular: Normal rate, regular rhythm and normal heart sounds.   Pulmonary/Chest: Effort normal and breath sounds normal. No respiratory distress. She has no wheezes. She has no rales.  Musculoskeletal: She exhibits no edema.  Neurological: She is alert and oriented to person, place, and time.  Skin:       Has 2 patches of macular, pink skiing without scaliness and a right lower quadrant and of the abdomen and proximal lower extremity (right)          Assessment & Plan:  OV took place while all computers were down, will call the patient and asked her to reschedule an office visit within a few weeks for a complete physical exam, fasting.

## 2011-07-11 NOTE — Assessment & Plan Note (Signed)
Patient has an on-off rash, in the past Dr. Mayford Knife did a biopsy (2011) she eventually was diagnosed with eczema. rash today is not typical for eczema. We agreed to: Continue with topical steroids Refer to new dermatologist.

## 2011-07-11 NOTE — Assessment & Plan Note (Signed)
No change 

## 2011-07-11 NOTE — Assessment & Plan Note (Signed)
We spent the majority of this 15 minute visit talking about her neck pain. I do believe is necessary for her to discuss with the doctor performing the local injection and see what her chances are of getting better w/ the shot.

## 2011-07-12 ENCOUNTER — Telehealth: Payer: Self-pay | Admitting: Internal Medicine

## 2011-07-12 NOTE — Telephone Encounter (Signed)
Please call the patient, advise her she needs a complete physical exam within 3 months.

## 2011-07-17 ENCOUNTER — Encounter: Payer: Self-pay | Admitting: Internal Medicine

## 2011-07-17 ENCOUNTER — Ambulatory Visit (INDEPENDENT_AMBULATORY_CARE_PROVIDER_SITE_OTHER): Payer: Medicare Other | Admitting: Internal Medicine

## 2011-07-17 VITALS — BP 124/78 | HR 81 | Ht 65.0 in | Wt 194.0 lb

## 2011-07-17 DIAGNOSIS — R1319 Other dysphagia: Secondary | ICD-10-CM

## 2011-07-17 DIAGNOSIS — K228 Other specified diseases of esophagus: Secondary | ICD-10-CM

## 2011-07-17 DIAGNOSIS — K2289 Other specified disease of esophagus: Secondary | ICD-10-CM

## 2011-07-17 LAB — COMPREHENSIVE METABOLIC PANEL
ALT: 22
AST: 26
Albumin: 3.9
Alkaline Phosphatase: 56
BUN: 11
CO2: 26
Calcium: 9.5
Chloride: 102
Creatinine, Ser: 0.96
GFR calc Af Amer: >60
GFR calc non Af Amer: 57 — ABNORMAL LOW
Glucose, Bld: 181 — ABNORMAL HIGH
Potassium: 4.2
Sodium: 137
Total Bilirubin: 0.6
Total Protein: 6.6

## 2011-07-17 LAB — POCT I-STAT CREATININE
Creatinine, Ser: 0.9
Creatinine, Ser: 0.9
Operator id: 284141
Operator id: 294521

## 2011-07-17 LAB — CBC
HCT: 41.2
HCT: 29.4 — ABNORMAL LOW
HCT: 32 — ABNORMAL LOW
HCT: 32.6 — ABNORMAL LOW
HCT: 34.1 — ABNORMAL LOW
HCT: 36.9
Hemoglobin: 14.1
Hemoglobin: 10.9 — ABNORMAL LOW
Hemoglobin: 10.9 — ABNORMAL LOW
Hemoglobin: 11.6 — ABNORMAL LOW
Hemoglobin: 12.5
Hemoglobin: 9.9 — ABNORMAL LOW
MCHC: 34.2
MCHC: 33.6
MCHC: 33.6
MCHC: 33.8
MCHC: 33.8
MCHC: 33.8
MCV: 92.4
MCV: 92.7
MCV: 93.1
MCV: 93.4
MCV: 93.7
MCV: 94
Platelets: 225
Platelets: 161
Platelets: 176
Platelets: 179
Platelets: 182
Platelets: 185
RBC: 4.46
RBC: 3.14 — ABNORMAL LOW
RBC: 3.41 — ABNORMAL LOW
RBC: 3.49 — ABNORMAL LOW
RBC: 3.68 — ABNORMAL LOW
RBC: 3.96
RDW: 14.2
RDW: 13.8
RDW: 14
RDW: 14.1
RDW: 14.3
RDW: 14.4
WBC: 7.1
WBC: 6.1
WBC: 6.9
WBC: 7.1
WBC: 7.9
WBC: 9

## 2011-07-17 LAB — URINALYSIS, ROUTINE W REFLEX MICROSCOPIC
Bilirubin Urine: NEGATIVE
Glucose, UA: NEGATIVE
Hgb urine dipstick: NEGATIVE
Ketones, ur: NEGATIVE
Nitrite: NEGATIVE
Protein, ur: NEGATIVE
Specific Gravity, Urine: 1.013
Urobilinogen, UA: 0.2
pH: 6

## 2011-07-17 LAB — DIFFERENTIAL
Basophils Absolute: 0
Basophils Relative: 0
Eosinophils Absolute: 0.1
Eosinophils Relative: 2
Lymphocytes Relative: 47 — ABNORMAL HIGH
Lymphs Abs: 3.4
Monocytes Absolute: 0.4
Monocytes Relative: 5
Neutro Abs: 3.2
Neutrophils Relative %: 46

## 2011-07-17 LAB — POCT CARDIAC MARKERS
CKMB, poc: 1.1
CKMB, poc: 1.1
Myoglobin, poc: 59.2
Myoglobin, poc: 68.1
Operator id: 284141
Operator id: 284141
Troponin i, poc: 0.08 — ABNORMAL HIGH
Troponin i, poc: <0.05

## 2011-07-17 LAB — PROTIME-INR
INR: 0.9
INR: 1
INR: 1.1
INR: 1.1
INR: 1.3
INR: 1.9 — ABNORMAL HIGH
Prothrombin Time: 12.7
Prothrombin Time: 13.4
Prothrombin Time: 14.1
Prothrombin Time: 14.2
Prothrombin Time: 16.5 — ABNORMAL HIGH
Prothrombin Time: 22.7 — ABNORMAL HIGH

## 2011-07-17 LAB — I-STAT 8, (EC8 V) (CONVERTED LAB)
Acid-Base Excess: 1
Acid-base deficit: 1
BUN: 21
BUN: 9
Bicarbonate: 22.9
Bicarbonate: 26.7 — ABNORMAL HIGH
Chloride: 103
Chloride: 106
Glucose, Bld: 200 — ABNORMAL HIGH
Glucose, Bld: 321 — ABNORMAL HIGH
HCT: 39
HCT: 45
Hemoglobin: 13.3
Hemoglobin: 15.3 — ABNORMAL HIGH
Operator id: 284141
Operator id: 294521
Potassium: 4
Potassium: 4.2
Sodium: 137
Sodium: 137
TCO2: 24
TCO2: 28
pCO2, Ven: 34.3 — ABNORMAL LOW
pCO2, Ven: 47.1
pH, Ven: 7.363 — ABNORMAL HIGH
pH, Ven: 7.433 — ABNORMAL HIGH

## 2011-07-17 LAB — CARDIAC PANEL(CRET KIN+CKTOT+MB+TROPI)
CK, MB: 2.7
CK, MB: 3
CK, MB: 3.5
Relative Index: 0.8
Relative Index: 3 — ABNORMAL HIGH
Relative Index: INVALID
Total CK: 116
Total CK: 338 — ABNORMAL HIGH
Total CK: 92
Troponin I: 0.06
Troponin I: 0.22 — ABNORMAL HIGH
Troponin I: 0.34 — ABNORMAL HIGH

## 2011-07-17 LAB — HEMOGLOBIN A1C
Hgb A1c MFr Bld: 6.5 — ABNORMAL HIGH
Hgb A1c MFr Bld: 6.9 — ABNORMAL HIGH
Mean Plasma Glucose: 154
Mean Plasma Glucose: 168

## 2011-07-17 LAB — BASIC METABOLIC PANEL
BUN: 6
CO2: 25
Calcium: 8.7
Chloride: 105
Creatinine, Ser: 0.75
GFR calc Af Amer: >60
GFR calc non Af Amer: >60
Glucose, Bld: 145 — ABNORMAL HIGH
Potassium: 3.6
Sodium: 136

## 2011-07-17 LAB — HEPARIN LEVEL (UNFRACTIONATED)
Heparin Unfractionated: 0.2 — ABNORMAL LOW
Heparin Unfractionated: 0.27 — ABNORMAL LOW
Heparin Unfractionated: 0.29 — ABNORMAL LOW
Heparin Unfractionated: 0.36
Heparin Unfractionated: 0.42

## 2011-07-17 LAB — HEMOGLOBIN AND HEMATOCRIT, BLOOD
HCT: 32 — ABNORMAL LOW
Hemoglobin: 10.9 — ABNORMAL LOW

## 2011-07-17 LAB — CK TOTAL AND CKMB (NOT AT ARMC)
CK, MB: 4.5 — ABNORMAL HIGH
Relative Index: 3 — ABNORMAL HIGH
Total CK: 151

## 2011-07-17 LAB — APTT
aPTT: 24
aPTT: 27

## 2011-07-17 LAB — TROPONIN I: Troponin I: 0.55

## 2011-07-17 LAB — D-DIMER, QUANTITATIVE (NOT AT ARMC): D-Dimer, Quant: 2 — ABNORMAL HIGH

## 2011-07-17 NOTE — Progress Notes (Signed)
[name] [birth year] MRN [id number]     History of Present Illness:  This is a 76 year old white female that esophageal dysmotility. She is status post Nissen fundoplication in April 2011 for severe gastroesophageal reflux and a 9 cm hiatal hernia. A recent upper GI series showed decreased primary peristaltic waves with slight distention of the thoracic esophagus and tertiary contractions. A 13 mm tablet passed with delay at the level of a Nissen fundoplication. An upper endoscopy in August 2012 showed no evidence of a stricture. She has presbyesophagus and retained food in her stomach. She feels much improved on the Prilosec 20 mg twice a day. She has a history of adenomatous colon polyps and is status post colonoscopy in August 2010. She has RSD, diabetes, asthma and high blood pressure.   Past Medical History  Diagnosis Date  . Hypertension   . Hyperlipidemia   . Diabetes mellitus   . Asthma   . Osteoporosis   . DVT (deep venous thrombosis) 12/2007    PE DVT after neck surgery, coumadin d/c 10-2008  . PE (pulmonary embolism) 12/2007  . Hiatal hernia     s/p repair   . Pulmonary nodule     incidental per CT:  Pet scan 4-9: likely benign, CT 08-2009 no change, no further CTs (Dr. Shelle Iron)  . Hemorrhoids   . IBS (irritable bowel syndrome)   . Colon polyp 1994    malignant  . Diverticulosis   . PPD positive   . Macular degeneration 03/2009    Dr. Earlene Plater  . Allergic rhinitis   . Dyspnea     chronic, extensive w/u see OV note 09-2010  . RSD (reflex sympathetic dystrophy)   . Hx of colonoscopy   . DJD (degenerative joint disease)   . Spinal stenosis    Past Surgical History  Procedure Date  . Hiatal hernia repair 01/25/10    with Nissen Fundaplication  . Cholecystectomy   . Total abdominal hysterectomy w/ bilateral salpingoophorectomy   . Cataract extraction   . Rotator cuff repair 07/2007    Dr. Eulah Pont  . Cervical spine surgery 12/18/07    For OA, fusion. Dr. Ophelia Charter:  fu by  a PE  . Toe amputation 08/2008    3rd right toe, Dr. Lajoyce Corners due to osteomyelitis    reports that she has never smoked. She has never used smokeless tobacco. She reports that she does not drink alcohol or use illicit drugs. family history includes Heart disease in her mother; Hypertension in an unspecified family member; Migraines in an unspecified family member; and Rheum arthritis in her mother.  There is no history of Colon cancer. Allergies  Allergen Reactions  . Adhesive (Tape)     rash  . Clorazepate Dipotassium   . Darifenacin Hydrobromide     REACTION: hypotension, near syncope  . Dilaudid (Hydromorphone Hcl)     REACTION: confused, intense itching  . Doxycycline     REACTION: RASH  . Methadone Hcl   . Pentazocine Lactate   . Talwin     Altered mental state       Review of Systems: Intermittent dysphagia. Intrascapular chest pain, negative for diarrhea constipation  The remainder of the 10  point ROS is negative except as outlined in H&P.    Assessment and Plan  Problem # 1 Esophageal dysmotility. Patient is status post remote Nissen fundoplication, degenerative joint disease of the cervical spine and spinal stenosis possibly impacting her swallowing. This is being evaluated by Dr Lajoyce Corners.  For now, she will continue on Prilosec, Carafate and antireflux measures. We will consider a speech pathology evaluation.  Problem #2 Adenomatous polyps of the colon. A recall colonoscopy will be due in August 2015.   07/17/2011 [name]

## 2011-07-17 NOTE — Patient Instructions (Addendum)
We will speak to speech pathology to get you a diet and precautions for dysmotility CC: Dr Drue Novel

## 2011-07-18 ENCOUNTER — Encounter: Payer: Self-pay | Admitting: Internal Medicine

## 2011-07-18 NOTE — Telephone Encounter (Signed)
Information forward to Okey Regal to schedule patient a complete physical in 3 months.

## 2011-07-20 NOTE — Telephone Encounter (Signed)
appt made

## 2011-07-21 ENCOUNTER — Other Ambulatory Visit: Payer: Self-pay | Admitting: Internal Medicine

## 2011-07-21 NOTE — Telephone Encounter (Signed)
Travel Sickness Chew Rx request?

## 2011-07-24 LAB — CARDIAC PANEL(CRET KIN+CKTOT+MB+TROPI)
CK, MB: 1.7
CK, MB: 2
CK, MB: 2.4
Relative Index: INVALID
Relative Index: INVALID
Relative Index: INVALID
Total CK: 52
Total CK: 63
Total CK: 74
Troponin I: 0.01
Troponin I: 0.01
Troponin I: <0.01

## 2011-07-24 LAB — CBC
HCT: 41.4
Hemoglobin: 13.9
MCHC: 33.4
MCV: 90.5
Platelets: 201
RBC: 4.58
RDW: 16 — ABNORMAL HIGH
WBC: 7.6

## 2011-07-24 LAB — URINALYSIS, ROUTINE W REFLEX MICROSCOPIC
Bilirubin Urine: NEGATIVE
Glucose, UA: NEGATIVE
Hgb urine dipstick: NEGATIVE
Ketones, ur: NEGATIVE
Nitrite: NEGATIVE
Protein, ur: NEGATIVE
Specific Gravity, Urine: 1.01
Urobilinogen, UA: 0.2
pH: 7

## 2011-07-24 LAB — GLUCOSE, CAPILLARY
Glucose-Capillary: 103 — ABNORMAL HIGH
Glucose-Capillary: 112 — ABNORMAL HIGH
Glucose-Capillary: 116 — ABNORMAL HIGH
Glucose-Capillary: 117 — ABNORMAL HIGH
Glucose-Capillary: 121 — ABNORMAL HIGH
Glucose-Capillary: 128 — ABNORMAL HIGH
Glucose-Capillary: 133 — ABNORMAL HIGH
Glucose-Capillary: 172 — ABNORMAL HIGH

## 2011-07-24 LAB — BASIC METABOLIC PANEL
BUN: 20
CO2: 26
Calcium: 9.1
Chloride: 104
Creatinine, Ser: 1.12
GFR calc Af Amer: 58 — ABNORMAL LOW
GFR calc non Af Amer: 48 — ABNORMAL LOW
Glucose, Bld: 152 — ABNORMAL HIGH
Potassium: 4.4
Sodium: 139

## 2011-07-24 LAB — PROTIME-INR
INR: 1.9 — ABNORMAL HIGH
INR: 2.2 — ABNORMAL HIGH
INR: 2.3 — ABNORMAL HIGH
Prothrombin Time: 22.7 — ABNORMAL HIGH
Prothrombin Time: 26 — ABNORMAL HIGH
Prothrombin Time: 26.9 — ABNORMAL HIGH

## 2011-07-25 ENCOUNTER — Other Ambulatory Visit: Payer: Self-pay | Admitting: Internal Medicine

## 2011-07-25 LAB — CBC
HCT: 41.9
Hemoglobin: 13.9
MCHC: 33.3
MCV: 90.1
Platelets: 225
RBC: 4.65
RDW: 14.8
WBC: 7.1

## 2011-07-25 LAB — BASIC METABOLIC PANEL
BUN: 18
CO2: 25
Calcium: 9.3
Chloride: 104
Creatinine, Ser: 0.84
GFR calc Af Amer: >60
GFR calc non Af Amer: >60
Glucose, Bld: 130 — ABNORMAL HIGH
Potassium: 4.7
Sodium: 137

## 2011-07-25 LAB — APTT: aPTT: 34

## 2011-07-25 LAB — PROTIME-INR
INR: 1.9 — ABNORMAL HIGH
INR: 1.9 — ABNORMAL HIGH
Prothrombin Time: 23.2 — ABNORMAL HIGH
Prothrombin Time: 23.3 — ABNORMAL HIGH

## 2011-07-25 LAB — GLUCOSE, CAPILLARY: Glucose-Capillary: 137 — ABNORMAL HIGH

## 2011-07-25 MED ORDER — MECLIZINE HCL 12.5 MG PO TABS: 12.5000 mg | ORAL_TABLET | Freq: Three times a day (TID) | ORAL | Status: DC | PRN

## 2011-07-25 NOTE — Telephone Encounter (Signed)
See phone note request from 07/21/11.

## 2011-07-25 NOTE — Telephone Encounter (Signed)
Advise patient, I sent a prescription to her pharmacy, be careful with meclizine, may cause drowsiness.

## 2011-07-25 NOTE — Telephone Encounter (Signed)
Patient is leaving town today wanted rx for meclizine - see refill request

## 2011-07-26 NOTE — Telephone Encounter (Signed)
LMOM to inform patient. 

## 2011-08-03 LAB — BASIC METABOLIC PANEL
BUN: 17
CO2: 25
Calcium: 8.9
Chloride: 105
Creatinine, Ser: 0.77
GFR calc Af Amer: >60
GFR calc non Af Amer: >60
Glucose, Bld: 202 — ABNORMAL HIGH
Potassium: 3.8
Sodium: 138

## 2011-08-03 LAB — POCT HEMOGLOBIN-HEMACUE
Hemoglobin: 14.2
Operator id: 112821

## 2011-08-04 LAB — URINALYSIS, ROUTINE W REFLEX MICROSCOPIC
Glucose, UA: NEGATIVE
Hgb urine dipstick: NEGATIVE
Ketones, ur: NEGATIVE
Nitrite: NEGATIVE
Protein, ur: NEGATIVE
Specific Gravity, Urine: 1.033 — ABNORMAL HIGH
Urobilinogen, UA: 0.2
pH: 5.5

## 2011-08-04 LAB — DIFFERENTIAL
Basophils Absolute: 0
Basophils Relative: 1
Eosinophils Absolute: 0
Eosinophils Relative: 0
Lymphocytes Relative: 34
Lymphs Abs: 3
Monocytes Absolute: 0.5
Monocytes Relative: 6
Neutro Abs: 5.2
Neutrophils Relative %: 59

## 2011-08-04 LAB — CBC
HCT: 44.2
Hemoglobin: 15.3 — ABNORMAL HIGH
MCHC: 34.5
MCV: 92.7
Platelets: 261
RBC: 4.77
RDW: 14.1 — ABNORMAL HIGH
WBC: 8.9

## 2011-08-04 LAB — URINE MICROSCOPIC-ADD ON

## 2011-08-04 LAB — BASIC METABOLIC PANEL
BUN: 32 — ABNORMAL HIGH
CO2: 22
Calcium: 9.4
Chloride: 105
Creatinine, Ser: 1.69 — ABNORMAL HIGH
GFR calc Af Amer: 36 — ABNORMAL LOW
GFR calc non Af Amer: 30 — ABNORMAL LOW
Glucose, Bld: 158 — ABNORMAL HIGH
Potassium: 4.3
Sodium: 138

## 2011-08-04 LAB — SAMPLE TO BLOOD BANK

## 2011-08-04 LAB — POCT CARDIAC MARKERS
CKMB, poc: 3.4
Myoglobin, poc: 76.5
Operator id: 151321
Troponin i, poc: <0.05

## 2011-08-04 LAB — URINE CULTURE: Colony Count: >=100000

## 2011-09-03 ENCOUNTER — Other Ambulatory Visit: Payer: Self-pay | Admitting: Internal Medicine

## 2011-09-23 HISTORY — PX: CARPAL TUNNEL RELEASE: SHX101

## 2011-09-26 ENCOUNTER — Encounter: Payer: Medicare Other | Admitting: Internal Medicine

## 2011-10-11 ENCOUNTER — Encounter: Payer: Medicare Other | Admitting: Internal Medicine

## 2011-10-13 ENCOUNTER — Other Ambulatory Visit: Payer: Self-pay | Admitting: Internal Medicine

## 2011-10-16 NOTE — Telephone Encounter (Signed)
Ok 120, 2 RF 

## 2011-10-18 ENCOUNTER — Encounter: Payer: Self-pay | Admitting: Internal Medicine

## 2011-10-20 ENCOUNTER — Encounter: Payer: Self-pay | Admitting: Internal Medicine

## 2011-10-20 ENCOUNTER — Ambulatory Visit (INDEPENDENT_AMBULATORY_CARE_PROVIDER_SITE_OTHER): Payer: Medicare Other | Admitting: Internal Medicine

## 2011-10-20 VITALS — BP 132/84 | HR 85 | Temp 98.5°F | Ht 64.0 in | Wt 192.6 lb

## 2011-10-20 DIAGNOSIS — N390 Urinary tract infection, site not specified: Secondary | ICD-10-CM

## 2011-10-20 DIAGNOSIS — I714 Abdominal aortic aneurysm, without rupture, unspecified: Secondary | ICD-10-CM

## 2011-10-20 DIAGNOSIS — E119 Type 2 diabetes mellitus without complications: Secondary | ICD-10-CM

## 2011-10-20 DIAGNOSIS — G9050 Complex regional pain syndrome I, unspecified: Secondary | ICD-10-CM

## 2011-10-20 DIAGNOSIS — Z136 Encounter for screening for cardiovascular disorders: Secondary | ICD-10-CM

## 2011-10-20 DIAGNOSIS — Z0000 Encounter for general adult medical examination without abnormal findings: Secondary | ICD-10-CM

## 2011-10-20 DIAGNOSIS — I7789 Other specified disorders of arteries and arterioles: Secondary | ICD-10-CM

## 2011-10-20 DIAGNOSIS — I10 Essential (primary) hypertension: Secondary | ICD-10-CM

## 2011-10-20 DIAGNOSIS — E785 Hyperlipidemia, unspecified: Secondary | ICD-10-CM

## 2011-10-20 DIAGNOSIS — M810 Age-related osteoporosis without current pathological fracture: Secondary | ICD-10-CM

## 2011-10-20 LAB — LIPID PANEL
Cholesterol: 192 mg/dL (ref 0–200)
HDL: 48 mg/dL (ref >39)
LDL Cholesterol: 79 mg/dL (ref 0–99)
Total CHOL/HDL Ratio: 4 Ratio
Triglycerides: 327 mg/dL — ABNORMAL HIGH (ref <150)
VLDL: 65 mg/dL — ABNORMAL HIGH (ref 0–40)

## 2011-10-20 LAB — BASIC METABOLIC PANEL
BUN: 19 mg/dL (ref 6–23)
CO2: 23 mEq/L (ref 19–32)
Calcium: 9.8 mg/dL (ref 8.4–10.5)
Chloride: 105 mEq/L (ref 96–112)
Creat: 0.79 mg/dL (ref 0.50–1.10)
Glucose, Bld: 123 mg/dL — ABNORMAL HIGH (ref 70–99)
Potassium: 4.7 mEq/L (ref 3.5–5.3)
Sodium: 141 mEq/L (ref 135–145)

## 2011-10-20 NOTE — Assessment & Plan Note (Signed)
Check a UA

## 2011-10-20 NOTE — Progress Notes (Signed)
Subjective:    Patient ID: [name], female    DOB: 1935/05/26, 76 y.o.   MRN: [id number]  HPI Here for Medicare AWV: 1. Risk factors based on Past M, S, F history: reviewed 2. Physical Activities: sedentary since fall 07-2011  3. Depression/mood: No problemss noted or reported   4. Hearing:  No problemss noted or reported  5. ADL's:  Independent, does not drive  6. Fall Risk: mechanical fell in October, was walking the dog, precautions discussed  7. home Safety: does feelsafe at home  8. Height, weight, &visual acuity: see VS, no problems w/ vision 9. Counseling: provided 10. Labs ordered based on risk factors: if needed  11. Referral Coordination: if needed 12.  Care Plan, see assessment and plan  13.   Cognitive Assessment: cognition normal, motor skills diminished , see PMH   In addition, today we discussed the following: RSD--on chronic hydrocodone, takes 4 tablets a day, states that she could "use more" Hypertension--good medication compliance, ambulatory BP is normal. Back pain-as see orthopedic surgery routinely, currently taking prednisone by mouth. Diabetes--on diet only, CBGs usually between 98 and 140 in the morning. CBGs not increased bite recent prednisone.   Past Medical History: Hypertension Hyperlipidemia Diabetes PE and DVT 12-2007, after neck surgery, CT chest 10-09 neg for PE , coumadin d/c 10-2008 CP 06-2008: admited, r/o for MI, ECHO normal; symptoms likely GI stress test abnormal 2007, but  normal 03-2009 GI PROBLEMS Large hiatal hernia repair 01/25/10 h/o IBS Hemorrhoids HX of Malignant Colon Polyp in 1994, colonoscopy again 8-10, had a biopsy. Next 2015 --------------------------------------------- ?asthma (SOB when attempted  to d/c singulair 11-2008) Pulmonary Nodule , incidental per CT: PET scan 4-9: likely  benign, CT 08-2009 no change, no further CTs( Dr Shelle Iron) REFLEX SYMPATHETIC DYSTROPHY  and Neuropathy DJD--chronic ankle pain, orthopedic  surgeon Dr.Duda Osteoporosis h/ o +PPD early macular degeneration June 2010 (Dr Earlene Plater) Orthostatic intolerance Allergic rhinitis  Past Surgical History: Cholecystectomy Hysterectomy w/o bilateral salpingo-oophorectomy hx of cataract sx  rotator cuff surgery 10-08 Dr Eulah Pont Cspine surgery for OA --fussion, Dr Ophelia Charter (12-18-07) (f/u by a PE) 3th R toe amputation d/t osteomyelitis 11-09 Dr Lajoyce Corners  hiatal hernia surgery, Nissen Fundaplication---(01/25/10) CTS L 09-2011  Social history: Divorced, 3 children, 1 daughter lives w/ her , another daughter lives in town, 1 son in [place] Tobacco--no ETOH--no Retired from Engineer, site of [place]  Family History CAD--- no DM-- daughter, type II Stroke--no Colon ca-- GM, age of onset? Breast ca--no    Review of Systems  Respiratory: Negative for cough and shortness of breath.   Cardiovascular: Negative for chest pain. Leg swelling: occ edema.  Gastrointestinal: Negative for abdominal pain and blood in stool.  Genitourinary: Negative for dysuria, hematuria, vaginal bleeding and vaginal discharge.       Objective:   Physical Exam  Constitutional: She is oriented to person, place, and time. She appears well-developed and well-nourished.  HENT:  Head: Normocephalic and atraumatic.  Neck: No thyromegaly present.       Normal carotid pulse  Cardiovascular: Normal rate, regular rhythm and normal heart sounds.   Pulmonary/Chest: Effort normal and breath sounds normal. No respiratory distress. She has no wheezes. She has no rales.  Abdominal: Soft. She exhibits no distension. There is no tenderness. There is no rebound and no guarding.       Barely palpable aorta in the upper abdomen, no tenderness or bruit  Musculoskeletal:       Chronic edema of  the left leg stable. Gait difficult due to to chronic ankle pain and RSD  Neurological: She is alert and oriented to person, place, and time.  Psychiatric: She has a normal mood and affect. Her  behavior is normal. Judgment and thought content normal.     Assessment & Plan:

## 2011-10-20 NOTE — Assessment & Plan Note (Signed)
Seems to be well controlled.

## 2011-10-20 NOTE — Assessment & Plan Note (Addendum)
Has been consistently reluctant to take any medications for cholesterol for years. Plan to check a  cholesterol panel today

## 2011-10-20 NOTE — Assessment & Plan Note (Signed)
On diet only,  Due for labs. Unable to exercise much due to orthopedic issues

## 2011-10-20 NOTE — Assessment & Plan Note (Addendum)
Last dexa 05-2007, showed osteoporosis. Has consistently refused to take any medications, both her mother and sister had "bad experiences with Fosamax" Risk of falls discussed. Refer for a DEXA at [facility]

## 2011-10-20 NOTE — Assessment & Plan Note (Signed)
On hycrocodone ~ 4 a day, will call for RF

## 2011-10-20 NOTE — Patient Instructions (Signed)
Eat  healthy stay active every day Please see your gynecologist

## 2011-10-20 NOTE — Assessment & Plan Note (Addendum)
Td < 10 years Have a flu shot already 2011 pneumonia shot Has a Rx for the shingles shot , plans to do it ASAP  Palpable aorta, schedule ultrasound r/o  AAA HX of Malignant Colon Polyp in 1994, colonoscopy again 8-10, had a biopsy. Next 2015   saw  gynecologist in 2010, encouraged to go back Last mammogram 07/2009, will schedule one at [place]. Encouraged self breast exam  Diet and exercise discussed

## 2011-10-21 LAB — HEMOGLOBIN A1C
Hgb A1c MFr Bld: 6.8 % — ABNORMAL HIGH (ref <5.7)
Mean Plasma Glucose: 148 mg/dL — ABNORMAL HIGH (ref <117)

## 2011-10-21 LAB — CBC WITH DIFFERENTIAL/PLATELET
Basophils Absolute: 0 10*3/uL (ref 0.0–0.1)
Basophils Relative: 0 % (ref 0–1)
Eosinophils Absolute: 0.3 10*3/uL (ref 0.0–0.7)
Eosinophils Relative: 3 % (ref 0–5)
HCT: 41.6 % (ref 36.0–46.0)
Hemoglobin: 12.8 g/dL (ref 12.0–15.0)
Lymphocytes Relative: 47 % — ABNORMAL HIGH (ref 12–46)
Lymphs Abs: 4.2 10*3/uL — ABNORMAL HIGH (ref 0.7–4.0)
MCH: 28.6 pg (ref 26.0–34.0)
MCHC: 30.8 g/dL (ref 30.0–36.0)
MCV: 93.1 fL (ref 78.0–100.0)
Monocytes Absolute: 0.6 10*3/uL (ref 0.1–1.0)
Monocytes Relative: 7 % (ref 3–12)
Neutro Abs: 3.9 10*3/uL (ref 1.7–7.7)
Neutrophils Relative %: 44 % (ref 43–77)
Platelets: 247 10*3/uL (ref 150–400)
RBC: 4.47 MIL/uL (ref 3.87–5.11)
RDW: 14.3 % (ref 11.5–15.5)
WBC: 8.9 10*3/uL (ref 4.0–10.5)

## 2011-10-21 LAB — VITAMIN D 25 HYDROXY (VIT D DEFICIENCY, FRACTURES): Vit D, 25-Hydroxy: 65 ng/mL (ref 30–89)

## 2011-10-21 LAB — TSH: TSH: 0.66 u[IU]/mL (ref 0.350–4.500)

## 2011-10-22 ENCOUNTER — Encounter: Payer: Self-pay | Admitting: Internal Medicine

## 2011-10-25 ENCOUNTER — Encounter: Payer: Self-pay | Admitting: Internal Medicine

## 2011-10-27 ENCOUNTER — Other Ambulatory Visit: Payer: Self-pay | Admitting: Cardiology

## 2011-10-27 DIAGNOSIS — I714 Abdominal aortic aneurysm, without rupture, unspecified: Secondary | ICD-10-CM

## 2011-10-27 NOTE — Progress Notes (Signed)
Addended by: Wanda Plump on: 10/27/2011 08:46 AM   Modules accepted: Orders

## 2011-10-30 ENCOUNTER — Encounter (INDEPENDENT_AMBULATORY_CARE_PROVIDER_SITE_OTHER): Payer: Medicare Other | Admitting: Cardiology

## 2011-10-30 DIAGNOSIS — I700 Atherosclerosis of aorta: Secondary | ICD-10-CM

## 2011-10-30 DIAGNOSIS — I714 Abdominal aortic aneurysm, without rupture, unspecified: Secondary | ICD-10-CM

## 2011-11-08 DIAGNOSIS — L97409 Non-pressure chronic ulcer of unspecified heel and midfoot with unspecified severity: Secondary | ICD-10-CM | POA: Diagnosis not present

## 2011-11-09 ENCOUNTER — Encounter: Payer: Medicare Other | Admitting: Cardiology

## 2011-11-09 DIAGNOSIS — M47817 Spondylosis without myelopathy or radiculopathy, lumbosacral region: Secondary | ICD-10-CM | POA: Diagnosis not present

## 2011-11-09 DIAGNOSIS — M961 Postlaminectomy syndrome, not elsewhere classified: Secondary | ICD-10-CM | POA: Diagnosis not present

## 2011-11-09 DIAGNOSIS — IMO0001 Reserved for inherently not codable concepts without codable children: Secondary | ICD-10-CM | POA: Diagnosis not present

## 2011-11-15 DIAGNOSIS — M949 Disorder of cartilage, unspecified: Secondary | ICD-10-CM | POA: Diagnosis not present

## 2011-11-15 DIAGNOSIS — Z8262 Family history of osteoporosis: Secondary | ICD-10-CM | POA: Diagnosis not present

## 2011-11-15 DIAGNOSIS — Z1231 Encounter for screening mammogram for malignant neoplasm of breast: Secondary | ICD-10-CM | POA: Diagnosis not present

## 2011-11-15 DIAGNOSIS — M899 Disorder of bone, unspecified: Secondary | ICD-10-CM | POA: Diagnosis not present

## 2011-11-15 LAB — HM MAMMOGRAPHY

## 2011-11-16 ENCOUNTER — Other Ambulatory Visit: Payer: Self-pay | Admitting: Internal Medicine

## 2011-11-29 ENCOUNTER — Telehealth: Payer: Self-pay | Admitting: Internal Medicine

## 2011-11-29 DIAGNOSIS — G5600 Carpal tunnel syndrome, unspecified upper limb: Secondary | ICD-10-CM | POA: Diagnosis not present

## 2011-11-29 DIAGNOSIS — L97409 Non-pressure chronic ulcer of unspecified heel and midfoot with unspecified severity: Secondary | ICD-10-CM | POA: Diagnosis not present

## 2011-11-29 DIAGNOSIS — Z4789 Encounter for other orthopedic aftercare: Secondary | ICD-10-CM | POA: Diagnosis not present

## 2011-11-29 NOTE — Telephone Encounter (Signed)
Advise patient: Bone density test showed osteopenia, stable compared to previous bone density test. Recommend calcium, vitamin D and stay active. We can discuss further when she comes back to the office

## 2011-11-30 NOTE — Telephone Encounter (Signed)
Spoke with pt about bone density test results. Pt states she is already taking calcium & vitamin D, should she increase her dose? Pt also states that her daughter picked up her meds from the pharmacy & the pharmacist told her that the combination of Relafen & Lotrel with long-term use can cause kidney damage. Please advise.

## 2011-12-01 ENCOUNTER — Encounter: Payer: Self-pay | Admitting: Internal Medicine

## 2011-12-01 NOTE — Telephone Encounter (Signed)
Spoke with pt witch instructions.

## 2011-12-01 NOTE — Telephone Encounter (Signed)
No need to increase vitamin D to her calcium intake. Any anti-inflammatory medication can damage the  kidney, since Relafen is helping a great deal I wouldn't stop, if would use it only as needed. We'll continue monitor her kidney function which has always been normal

## 2011-12-15 ENCOUNTER — Other Ambulatory Visit: Payer: Self-pay | Admitting: Internal Medicine

## 2011-12-15 NOTE — Telephone Encounter (Signed)
Received fax from [facility] [phone], [employer] [place] Cover F# [phone] Meclizine 12.5MG  Tablet, qty - 30 Thank You

## 2011-12-15 NOTE — Telephone Encounter (Signed)
Refill done.  

## 2011-12-20 ENCOUNTER — Other Ambulatory Visit: Payer: Self-pay | Admitting: *Deleted

## 2011-12-20 MED ORDER — MECLIZINE HCL 12.5 MG PO TABS: 12.5000 mg | ORAL_TABLET | Freq: Three times a day (TID) | ORAL | Status: DC | PRN

## 2011-12-20 NOTE — Telephone Encounter (Signed)
Pt called requesting a refill for meclizine. She states that she has been taking the 25mg  but there is also 12.5mg  on med list. I wanted to make sure she is suppose to be taking the 25mg  before i refill the prescription. Please advise.

## 2011-12-20 NOTE — Telephone Encounter (Signed)
12.5 mg

## 2011-12-20 NOTE — Telephone Encounter (Signed)
Refill done.  

## 2011-12-26 DIAGNOSIS — G609 Hereditary and idiopathic neuropathy, unspecified: Secondary | ICD-10-CM | POA: Diagnosis not present

## 2011-12-26 DIAGNOSIS — E1149 Type 2 diabetes mellitus with other diabetic neurological complication: Secondary | ICD-10-CM | POA: Diagnosis not present

## 2012-01-09 ENCOUNTER — Other Ambulatory Visit: Payer: Self-pay | Admitting: Internal Medicine

## 2012-01-09 DIAGNOSIS — L97409 Non-pressure chronic ulcer of unspecified heel and midfoot with unspecified severity: Secondary | ICD-10-CM | POA: Diagnosis not present

## 2012-01-09 NOTE — Telephone Encounter (Signed)
Refill request hydrocodone 10-660mg  #120 with 2 refills. Last refilled on 12..21.12 OK to refill?

## 2012-01-10 NOTE — Telephone Encounter (Signed)
120, 2 RFs

## 2012-01-10 NOTE — Telephone Encounter (Signed)
Refill done.  

## 2012-01-17 DIAGNOSIS — M62838 Other muscle spasm: Secondary | ICD-10-CM | POA: Diagnosis not present

## 2012-01-17 DIAGNOSIS — M545 Low back pain, unspecified: Secondary | ICD-10-CM | POA: Diagnosis not present

## 2012-01-17 DIAGNOSIS — M999 Biomechanical lesion, unspecified: Secondary | ICD-10-CM | POA: Diagnosis not present

## 2012-01-23 DIAGNOSIS — M545 Low back pain, unspecified: Secondary | ICD-10-CM | POA: Diagnosis not present

## 2012-01-23 DIAGNOSIS — M25519 Pain in unspecified shoulder: Secondary | ICD-10-CM | POA: Diagnosis not present

## 2012-01-23 DIAGNOSIS — M25539 Pain in unspecified wrist: Secondary | ICD-10-CM | POA: Diagnosis not present

## 2012-01-23 DIAGNOSIS — L97409 Non-pressure chronic ulcer of unspecified heel and midfoot with unspecified severity: Secondary | ICD-10-CM | POA: Diagnosis not present

## 2012-02-05 DIAGNOSIS — M62838 Other muscle spasm: Secondary | ICD-10-CM | POA: Diagnosis not present

## 2012-02-05 DIAGNOSIS — M999 Biomechanical lesion, unspecified: Secondary | ICD-10-CM | POA: Diagnosis not present

## 2012-02-05 DIAGNOSIS — M545 Low back pain, unspecified: Secondary | ICD-10-CM | POA: Diagnosis not present

## 2012-02-06 DIAGNOSIS — M999 Biomechanical lesion, unspecified: Secondary | ICD-10-CM | POA: Diagnosis not present

## 2012-02-06 DIAGNOSIS — M545 Low back pain, unspecified: Secondary | ICD-10-CM | POA: Diagnosis not present

## 2012-02-06 DIAGNOSIS — M62838 Other muscle spasm: Secondary | ICD-10-CM | POA: Diagnosis not present

## 2012-02-08 DIAGNOSIS — M545 Low back pain, unspecified: Secondary | ICD-10-CM | POA: Diagnosis not present

## 2012-02-08 DIAGNOSIS — M62838 Other muscle spasm: Secondary | ICD-10-CM | POA: Diagnosis not present

## 2012-02-08 DIAGNOSIS — M999 Biomechanical lesion, unspecified: Secondary | ICD-10-CM | POA: Diagnosis not present

## 2012-02-12 DIAGNOSIS — M545 Low back pain, unspecified: Secondary | ICD-10-CM | POA: Diagnosis not present

## 2012-02-12 DIAGNOSIS — M62838 Other muscle spasm: Secondary | ICD-10-CM | POA: Diagnosis not present

## 2012-02-12 DIAGNOSIS — M999 Biomechanical lesion, unspecified: Secondary | ICD-10-CM | POA: Diagnosis not present

## 2012-02-13 DIAGNOSIS — M545 Low back pain, unspecified: Secondary | ICD-10-CM | POA: Diagnosis not present

## 2012-02-13 DIAGNOSIS — M62838 Other muscle spasm: Secondary | ICD-10-CM | POA: Diagnosis not present

## 2012-02-13 DIAGNOSIS — M999 Biomechanical lesion, unspecified: Secondary | ICD-10-CM | POA: Diagnosis not present

## 2012-02-15 DIAGNOSIS — M545 Low back pain, unspecified: Secondary | ICD-10-CM | POA: Diagnosis not present

## 2012-02-15 DIAGNOSIS — M999 Biomechanical lesion, unspecified: Secondary | ICD-10-CM | POA: Diagnosis not present

## 2012-02-15 DIAGNOSIS — M62838 Other muscle spasm: Secondary | ICD-10-CM | POA: Diagnosis not present

## 2012-02-19 ENCOUNTER — Ambulatory Visit: Payer: Medicare Other | Admitting: Internal Medicine

## 2012-02-20 DIAGNOSIS — M545 Low back pain, unspecified: Secondary | ICD-10-CM | POA: Diagnosis not present

## 2012-02-20 DIAGNOSIS — M999 Biomechanical lesion, unspecified: Secondary | ICD-10-CM | POA: Diagnosis not present

## 2012-02-20 DIAGNOSIS — M62838 Other muscle spasm: Secondary | ICD-10-CM | POA: Diagnosis not present

## 2012-02-22 DIAGNOSIS — M999 Biomechanical lesion, unspecified: Secondary | ICD-10-CM | POA: Diagnosis not present

## 2012-02-22 DIAGNOSIS — M545 Low back pain, unspecified: Secondary | ICD-10-CM | POA: Diagnosis not present

## 2012-02-22 DIAGNOSIS — M62838 Other muscle spasm: Secondary | ICD-10-CM | POA: Diagnosis not present

## 2012-02-23 DIAGNOSIS — M999 Biomechanical lesion, unspecified: Secondary | ICD-10-CM | POA: Diagnosis not present

## 2012-02-23 DIAGNOSIS — M545 Low back pain, unspecified: Secondary | ICD-10-CM | POA: Diagnosis not present

## 2012-02-23 DIAGNOSIS — M62838 Other muscle spasm: Secondary | ICD-10-CM | POA: Diagnosis not present

## 2012-02-27 DIAGNOSIS — M999 Biomechanical lesion, unspecified: Secondary | ICD-10-CM | POA: Diagnosis not present

## 2012-02-27 DIAGNOSIS — M545 Low back pain, unspecified: Secondary | ICD-10-CM | POA: Diagnosis not present

## 2012-02-27 DIAGNOSIS — M62838 Other muscle spasm: Secondary | ICD-10-CM | POA: Diagnosis not present

## 2012-02-28 DIAGNOSIS — M999 Biomechanical lesion, unspecified: Secondary | ICD-10-CM | POA: Diagnosis not present

## 2012-02-28 DIAGNOSIS — M62838 Other muscle spasm: Secondary | ICD-10-CM | POA: Diagnosis not present

## 2012-02-28 DIAGNOSIS — M545 Low back pain, unspecified: Secondary | ICD-10-CM | POA: Diagnosis not present

## 2012-03-01 DIAGNOSIS — M86679 Other chronic osteomyelitis, unspecified ankle and foot: Secondary | ICD-10-CM | POA: Diagnosis not present

## 2012-03-01 DIAGNOSIS — M545 Low back pain, unspecified: Secondary | ICD-10-CM | POA: Diagnosis not present

## 2012-03-01 DIAGNOSIS — L97409 Non-pressure chronic ulcer of unspecified heel and midfoot with unspecified severity: Secondary | ICD-10-CM | POA: Diagnosis not present

## 2012-03-01 DIAGNOSIS — M62838 Other muscle spasm: Secondary | ICD-10-CM | POA: Diagnosis not present

## 2012-03-01 DIAGNOSIS — M999 Biomechanical lesion, unspecified: Secondary | ICD-10-CM | POA: Diagnosis not present

## 2012-03-05 ENCOUNTER — Encounter (HOSPITAL_COMMUNITY): Payer: Self-pay | Admitting: General Practice

## 2012-03-05 ENCOUNTER — Inpatient Hospital Stay (HOSPITAL_COMMUNITY): Payer: Medicare Other

## 2012-03-05 ENCOUNTER — Inpatient Hospital Stay (HOSPITAL_COMMUNITY)
Admission: AD | Admit: 2012-03-05 | Discharge: 2012-03-11 | DRG: 617 | Disposition: A | Discharge status: Skilled Nursing Facility | Payer: Medicare Other | Source: Ambulatory Visit | Attending: Orthopedic Surgery | Admitting: Orthopedic Surgery

## 2012-03-05 DIAGNOSIS — A5211 Tabes dorsalis: Secondary | ICD-10-CM | POA: Diagnosis present

## 2012-03-05 DIAGNOSIS — E119 Type 2 diabetes mellitus without complications: Secondary | ICD-10-CM | POA: Diagnosis not present

## 2012-03-05 DIAGNOSIS — M810 Age-related osteoporosis without current pathological fracture: Secondary | ICD-10-CM | POA: Diagnosis present

## 2012-03-05 DIAGNOSIS — M86172 Other acute osteomyelitis, left ankle and foot: Secondary | ICD-10-CM

## 2012-03-05 DIAGNOSIS — S98139A Complete traumatic amputation of one unspecified lesser toe, initial encounter: Secondary | ICD-10-CM | POA: Diagnosis not present

## 2012-03-05 DIAGNOSIS — Z79899 Other long term (current) drug therapy: Secondary | ICD-10-CM | POA: Diagnosis not present

## 2012-03-05 DIAGNOSIS — M9080 Osteopathy in diseases classified elsewhere, unspecified site: Secondary | ICD-10-CM | POA: Diagnosis present

## 2012-03-05 DIAGNOSIS — L02619 Cutaneous abscess of unspecified foot: Secondary | ICD-10-CM | POA: Diagnosis present

## 2012-03-05 DIAGNOSIS — I10 Essential (primary) hypertension: Secondary | ICD-10-CM | POA: Diagnosis present

## 2012-03-05 DIAGNOSIS — E1142 Type 2 diabetes mellitus with diabetic polyneuropathy: Secondary | ICD-10-CM | POA: Diagnosis present

## 2012-03-05 DIAGNOSIS — Z7982 Long term (current) use of aspirin: Secondary | ICD-10-CM

## 2012-03-05 DIAGNOSIS — H3530 Unspecified macular degeneration: Secondary | ICD-10-CM | POA: Diagnosis present

## 2012-03-05 DIAGNOSIS — E1169 Type 2 diabetes mellitus with other specified complication: Secondary | ICD-10-CM | POA: Diagnosis not present

## 2012-03-05 DIAGNOSIS — M869 Osteomyelitis, unspecified: Secondary | ICD-10-CM | POA: Diagnosis not present

## 2012-03-05 DIAGNOSIS — R0602 Shortness of breath: Secondary | ICD-10-CM | POA: Diagnosis not present

## 2012-03-05 DIAGNOSIS — Z934 Other artificial openings of gastrointestinal tract status: Secondary | ICD-10-CM

## 2012-03-05 DIAGNOSIS — L0291 Cutaneous abscess, unspecified: Secondary | ICD-10-CM | POA: Diagnosis not present

## 2012-03-05 DIAGNOSIS — M79609 Pain in unspecified limb: Secondary | ICD-10-CM | POA: Diagnosis not present

## 2012-03-05 DIAGNOSIS — M86179 Other acute osteomyelitis, unspecified ankle and foot: Secondary | ICD-10-CM | POA: Diagnosis not present

## 2012-03-05 DIAGNOSIS — J45909 Unspecified asthma, uncomplicated: Secondary | ICD-10-CM | POA: Diagnosis not present

## 2012-03-05 DIAGNOSIS — Z86711 Personal history of pulmonary embolism: Secondary | ICD-10-CM

## 2012-03-05 DIAGNOSIS — Z86718 Personal history of other venous thrombosis and embolism: Secondary | ICD-10-CM

## 2012-03-05 DIAGNOSIS — R918 Other nonspecific abnormal finding of lung field: Secondary | ICD-10-CM | POA: Diagnosis not present

## 2012-03-05 DIAGNOSIS — Z5189 Encounter for other specified aftercare: Secondary | ICD-10-CM | POA: Diagnosis not present

## 2012-03-05 DIAGNOSIS — L97409 Non-pressure chronic ulcer of unspecified heel and midfoot with unspecified severity: Secondary | ICD-10-CM | POA: Diagnosis not present

## 2012-03-05 DIAGNOSIS — M4800 Spinal stenosis, site unspecified: Secondary | ICD-10-CM | POA: Diagnosis not present

## 2012-03-05 DIAGNOSIS — G9050 Complex regional pain syndrome I, unspecified: Secondary | ICD-10-CM | POA: Diagnosis present

## 2012-03-05 DIAGNOSIS — L97509 Non-pressure chronic ulcer of other part of unspecified foot with unspecified severity: Secondary | ICD-10-CM | POA: Diagnosis present

## 2012-03-05 DIAGNOSIS — Z01811 Encounter for preprocedural respiratory examination: Secondary | ICD-10-CM | POA: Diagnosis not present

## 2012-03-05 DIAGNOSIS — K589 Irritable bowel syndrome without diarrhea: Secondary | ICD-10-CM | POA: Diagnosis present

## 2012-03-05 DIAGNOSIS — L03119 Cellulitis of unspecified part of limb: Secondary | ICD-10-CM | POA: Diagnosis present

## 2012-03-05 DIAGNOSIS — K219 Gastro-esophageal reflux disease without esophagitis: Secondary | ICD-10-CM | POA: Diagnosis present

## 2012-03-05 DIAGNOSIS — E785 Hyperlipidemia, unspecified: Secondary | ICD-10-CM | POA: Diagnosis present

## 2012-03-05 DIAGNOSIS — E1149 Type 2 diabetes mellitus with other diabetic neurological complication: Secondary | ICD-10-CM | POA: Diagnosis present

## 2012-03-05 DIAGNOSIS — M86669 Other chronic osteomyelitis, unspecified tibia and fibula: Secondary | ICD-10-CM | POA: Diagnosis not present

## 2012-03-05 DIAGNOSIS — M86679 Other chronic osteomyelitis, unspecified ankle and foot: Secondary | ICD-10-CM | POA: Diagnosis not present

## 2012-03-05 DIAGNOSIS — M1990 Unspecified osteoarthritis, unspecified site: Secondary | ICD-10-CM | POA: Diagnosis not present

## 2012-03-05 DIAGNOSIS — R279 Unspecified lack of coordination: Secondary | ICD-10-CM | POA: Diagnosis not present

## 2012-03-05 DIAGNOSIS — R269 Unspecified abnormalities of gait and mobility: Secondary | ICD-10-CM | POA: Diagnosis not present

## 2012-03-05 HISTORY — DX: Nausea with vomiting, unspecified: R11.2

## 2012-03-05 HISTORY — DX: Other specified postprocedural states: Z98.890

## 2012-03-05 HISTORY — DX: Gastro-esophageal reflux disease without esophagitis: K21.9

## 2012-03-05 LAB — COMPREHENSIVE METABOLIC PANEL
ALT: 11 U/L (ref 0–35)
AST: 17 U/L (ref 0–37)
Albumin: 3.2 g/dL — ABNORMAL LOW (ref 3.5–5.2)
Alkaline Phosphatase: 85 U/L (ref 39–117)
BUN: 22 mg/dL (ref 6–23)
CO2: 21 mEq/L (ref 19–32)
Calcium: 9.3 mg/dL (ref 8.4–10.5)
Chloride: 104 mEq/L (ref 96–112)
Creatinine, Ser: 1.16 mg/dL — ABNORMAL HIGH (ref 0.50–1.10)
GFR calc Af Amer: 52 mL/min — ABNORMAL LOW (ref >90)
GFR calc non Af Amer: 45 mL/min — ABNORMAL LOW (ref >90)
Glucose, Bld: 193 mg/dL — ABNORMAL HIGH (ref 70–99)
Potassium: 4.3 mEq/L (ref 3.5–5.1)
Sodium: 136 mEq/L (ref 135–145)
Total Bilirubin: 0.2 mg/dL — ABNORMAL LOW (ref 0.3–1.2)
Total Protein: 6.9 g/dL (ref 6.0–8.3)

## 2012-03-05 LAB — CBC
HCT: 33.4 % — ABNORMAL LOW (ref 36.0–46.0)
Hemoglobin: 10.7 g/dL — ABNORMAL LOW (ref 12.0–15.0)
MCH: 28.5 pg (ref 26.0–34.0)
MCHC: 32 g/dL (ref 30.0–36.0)
MCV: 88.8 fL (ref 78.0–100.0)
Platelets: 199 10*3/uL (ref 150–400)
RBC: 3.76 MIL/uL — ABNORMAL LOW (ref 3.87–5.11)
RDW: 14.7 % (ref 11.5–15.5)
WBC: 8.1 10*3/uL (ref 4.0–10.5)

## 2012-03-05 MED ORDER — PANTOPRAZOLE SODIUM 40 MG PO TBEC
40.0000 mg | DELAYED_RELEASE_TABLET | Freq: Every day | ORAL | Status: DC
  Administered 2012-03-06 – 2012-03-11 (×6): 40 mg via ORAL
  Filled 2012-03-05 (×5): qty 1

## 2012-03-05 MED ORDER — VANCOMYCIN HCL IN DEXTROSE 1-5 GM/200ML-% IV SOLN
1000.0000 mg | Freq: Two times a day (BID) | INTRAVENOUS | Status: DC
  Administered 2012-03-05 – 2012-03-06 (×2): 1000 mg via INTRAVENOUS
  Filled 2012-03-05 (×3): qty 200

## 2012-03-05 MED ORDER — AMLODIPINE BESY-BENAZEPRIL HCL 5-10 MG PO CAPS: 1.0000 | ORAL_CAPSULE | Freq: Every day | ORAL | Status: DC

## 2012-03-05 MED ORDER — PIPERACILLIN-TAZOBACTAM 3.375 G IVPB
3.3750 g | Freq: Four times a day (QID) | INTRAVENOUS | Status: DC
  Filled 2012-03-05 (×3): qty 50

## 2012-03-05 MED ORDER — BENAZEPRIL HCL 10 MG PO TABS
10.0000 mg | ORAL_TABLET | Freq: Every day | ORAL | Status: DC
  Administered 2012-03-07 – 2012-03-11 (×5): 10 mg via ORAL
  Filled 2012-03-05 (×6): qty 1

## 2012-03-05 MED ORDER — INSULIN ASPART 100 UNIT/ML ~~LOC~~ SOLN
0.0000 [IU] | Freq: Three times a day (TID) | SUBCUTANEOUS | Status: DC
  Administered 2012-03-06: 3 [IU] via SUBCUTANEOUS
  Administered 2012-03-07 – 2012-03-08 (×4): 2 [IU] via SUBCUTANEOUS
  Administered 2012-03-09: 3 [IU] via SUBCUTANEOUS
  Administered 2012-03-10: 2 [IU] via SUBCUTANEOUS
  Administered 2012-03-10 (×2): 3 [IU] via SUBCUTANEOUS
  Administered 2012-03-11: 2 [IU] via SUBCUTANEOUS

## 2012-03-05 MED ORDER — SODIUM CHLORIDE 0.9 % IV SOLN: INTRAVENOUS | Status: DC

## 2012-03-05 MED ORDER — PIPERACILLIN-TAZOBACTAM 3.375 G IVPB
3.3750 g | Freq: Three times a day (TID) | INTRAVENOUS | Status: DC
  Administered 2012-03-05 – 2012-03-11 (×17): 3.375 g via INTRAVENOUS
  Filled 2012-03-05 (×20): qty 50

## 2012-03-05 MED ORDER — AMLODIPINE BESYLATE 5 MG PO TABS
5.0000 mg | ORAL_TABLET | Freq: Every day | ORAL | Status: DC
  Administered 2012-03-07 – 2012-03-11 (×5): 5 mg via ORAL
  Filled 2012-03-05 (×6): qty 1

## 2012-03-05 MED ORDER — CHLORHEXIDINE GLUCONATE 4 % EX LIQD
60.0000 mL | Freq: Once | CUTANEOUS | Status: AC
  Administered 2012-03-06: 4 via TOPICAL
  Filled 2012-03-05 (×2): qty 60

## 2012-03-05 MED ORDER — MECLIZINE HCL 25 MG PO TABS
25.0000 mg | ORAL_TABLET | Freq: Three times a day (TID) | ORAL | Status: DC | PRN
  Filled 2012-03-05: qty 1

## 2012-03-05 MED ORDER — HYDROCODONE-ACETAMINOPHEN 10-325 MG PO TABS
1.0000 | ORAL_TABLET | ORAL | Status: DC | PRN
  Administered 2012-03-05 – 2012-03-11 (×19): 1 via ORAL
  Filled 2012-03-05 (×19): qty 1

## 2012-03-05 MED ORDER — INSULIN ASPART 100 UNIT/ML ~~LOC~~ SOLN
4.0000 [IU] | Freq: Three times a day (TID) | SUBCUTANEOUS | Status: DC
  Administered 2012-03-06 – 2012-03-11 (×11): 4 [IU] via SUBCUTANEOUS

## 2012-03-05 NOTE — H&P (Signed)
[name] is an 77 y.o. female.   Chief Complaint:ulcer, odor and drainage left foot [id number] has had acute wounds both feet which have slowly worsened with necrotic tissue left foot.  Past Medical History  Diagnosis Date  . Hypertension   . Hyperlipidemia   . Diabetes mellitus   . Asthma   . Osteoporosis   . DVT (deep venous thrombosis) 12/2007    PE DVT after neck surgery, coumadin d/c 10-2008  . PE (pulmonary embolism) 12/2007  . Hiatal hernia     s/p repair   . Pulmonary nodule     incidental per CT:  Pet scan 4-9: likely benign, CT 08-2009 no change, no further CTs (Dr. Shelle Iron)  . Hemorrhoids   . IBS (irritable bowel syndrome)   . Colon polyp 1994    malignant  . Diverticulosis   . PPD positive   . Macular degeneration 03/2009    Dr. Earlene Plater  . Allergic rhinitis   . Dyspnea     chronic, extensive w/u see OV note 09-2010  . RSD (reflex sympathetic dystrophy)   . Hx of colonoscopy   . DJD (degenerative joint disease)   . Spinal stenosis   . Complication of anesthesia   . PONV (postoperative nausea and vomiting)   . GERD (gastroesophageal reflux disease)     Past Surgical History  Procedure Date  . Hiatal hernia repair 01/25/10    with Nissen Fundaplication  . Cholecystectomy   . Total abdominal hysterectomy w/ bilateral salpingoophorectomy   . Cataract extraction   . Rotator cuff repair 07/2007    Dr. Eulah Pont  . Cervical spine surgery 12/18/07    For OA, fusion. Dr. Ophelia Charter:  fu by a PE  . Toe amputation 08/2008    3rd right toe, Dr. Lajoyce Corners due to osteomyelitis  . Hand surgery 09/27/11    Left Hand  . Nasal septum surgery   . Vein ligation 1967  . Carpal tunnel 09/2011    Family History  Problem Relation Age of Onset  . Migraines      headaches  . Hypertension    . Heart disease Mother     mitral valve replaced  . Rheum arthritis Mother   . Colon cancer Neg Hx    Social History:  reports that she has never smoked. She has never used smokeless tobacco.  She reports that she does not drink alcohol or use illicit drugs.  Allergies:  Allergies  Allergen Reactions  . Adhesive (Tape)     rash  . Clorazepate Dipotassium   . Darifenacin Hydrobromide     REACTION: hypotension, near syncope  . Dilaudid (Hydromorphone Hcl)     REACTION: confused, intense itching  . Doxycycline     REACTION: RASH  . Methadone Hcl   . Pentazocine Lactate   . Pentazocine Lactate     Altered mental state    Medications Prior to Admission  Medication Sig Dispense Refill  . amLODipine-benazepril (LOTREL) 5-10 MG per capsule take 1 capsule by mouth once daily  30 capsule  5  . aspirin 81 MG tablet Take 81 mg by mouth daily.        Marland Kitchen bismuth subsalicylate (PEPTO BISMOL) 262 MG/15ML suspension Take 15 mLs by mouth as needed.        . Cholecalciferol (VITAMIN D) 2000 UNITS tablet Take 5,000 Units by mouth daily. 3 tabs daily      . diclofenac sodium (VOLTAREN) 1 % GEL Apply topically as needed. Apply prn  to ankle per pt.       Marland Kitchen FREESTYLE LITE test strip CHECK BLOOD SUGAR TWICE A DAY  100 each  10  . Hydrocodone-Acetaminophen 10-660 MG TABS take 1 tablet by mouth every 4 hours if needed for pain  120 each  2  . L-Methylfolate-B6-B12 (VITACIRC-B PO) Take 1 tablet by mouth 2 (two) times daily.        . Lancets (FREESTYLE) lancets use twice a day  100 each  10  . LUTEIN PO Take by mouth as directed.        . meclizine (ANTIVERT) 12.5 MG tablet Take 1 tablet (12.5 mg total) by mouth 3 (three) times daily as needed.  90 tablet  3  . meclizine (ANTIVERT) 25 MG tablet Take 25 mg by mouth daily as needed. 1 po prn for dizziness       . Multiple Vitamins-Minerals (ICAPS MV PO) Take by mouth daily.        . nabumetone (RELAFEN) 750 MG tablet Take 750 mg by mouth 2 (two) times daily.        Marland Kitchen omeprazole (PRILOSEC) 20 MG capsule take 1 capsule by mouth twice a day  60 capsule  5  . OVER THE COUNTER MEDICATION 1 tablet as needed. Antihistamine-decongestant       . predniSONE  (STERAPRED UNI-PAK) 10 MG tablet Take 10 mg by mouth daily.        Marland Kitchen PRESCRIPTION MEDICATION Take 1 tablet by mouth daily. MyBertriq 25mg          No results found for this or any previous visit (from the past 48 hour(s)). No results found.  Review of Systems  All other systems reviewed and are negative.    Blood pressure 109/70, pulse 95, temperature 97.8 F (36.6 C), temperature source Oral, resp. rate 18, SpO2 96.00%. Physical Exam  Palpable pulses, open necrotic wound over 5th metatarsal with exposed bone, abscess and cellulitis Assessment/Plan Abscess, cellulitis, necrotic ulcer with osteomyelitis 5th metatarsal Plan for 5th ray amputation left foot, IV vanc and zosyn  Zareen Jamison V 03/05/2012, 6:02 PM

## 2012-03-05 NOTE — Progress Notes (Signed)
ANTICOAGULATION CONSULT NOTE - Initial Consult  Pharmacy Consult for Coumadin Indication: VTE prophylaxis  Allergies  Allergen Reactions  . Adhesive (Tape)     rash  . Clorazepate Dipotassium   . Darifenacin Hydrobromide     REACTION: hypotension, near syncope  . Dilaudid (Hydromorphone Hcl)     REACTION: confused, intense itching  . Doxycycline     REACTION: RASH  . Methadone Hcl   . Pentazocine Lactate   . Pentazocine Lactate     Altered mental state    Vital Signs: Temp: 98.4 F (36.9 C) (05/14 2127) Temp src: Oral (05/14 1724) BP: 103/63 mmHg (05/14 2127) Pulse Rate: 84  (05/14 2127)  Labs:  Basename 03/05/12 2045  HGB 10.7*  HCT 33.4*  PLT 199  APTT --  LABPROT --  INR --  HEPARINUNFRC --  CREATININE 1.16*  CKTOTAL --  CKMB --  TROPONINI --    Medical History: Past Medical History  Diagnosis Date  . Hypertension   . Hyperlipidemia   . Diabetes mellitus   . Asthma   . Osteoporosis   . DVT (deep venous thrombosis) 12/2007    PE DVT after neck surgery, coumadin d/c 10-2008  . PE (pulmonary embolism) 12/2007  . Hiatal hernia     s/p repair   . Pulmonary nodule     incidental per CT:  Pet scan 4-9: likely benign, CT 08-2009 no change, no further CTs (Dr. Shelle Iron)  . Hemorrhoids   . IBS (irritable bowel syndrome)   . Colon polyp 1994    malignant  . Diverticulosis   . PPD positive   . Macular degeneration 03/2009    Dr. Earlene Plater  . Allergic rhinitis   . Dyspnea     chronic, extensive w/u see OV note 09-2010  . RSD (reflex sympathetic dystrophy)   . Hx of colonoscopy   . DJD (degenerative joint disease)   . Spinal stenosis   . Complication of anesthesia   . PONV (postoperative nausea and vomiting)   . GERD (gastroesophageal reflux disease)     Medications:  Prescriptions prior to admission  Medication Sig Dispense Refill  . amLODipine-benazepril (LOTREL) 5-10 MG per capsule take 1 capsule by mouth once daily  30 capsule  5  . aspirin 81 MG  tablet Take 81 mg by mouth daily.        Marland Kitchen bismuth subsalicylate (PEPTO BISMOL) 262 MG/15ML suspension Take 15 mLs by mouth as needed.        . Cholecalciferol (VITAMIN D) 2000 UNITS tablet Take 5,000 Units by mouth daily. 3 tabs daily      . diclofenac sodium (VOLTAREN) 1 % GEL Apply topically as needed. Apply prn to ankle per pt.       Marland Kitchen FREESTYLE LITE test strip CHECK BLOOD SUGAR TWICE A DAY  100 each  10  . Hydrocodone-Acetaminophen 10-660 MG TABS take 1 tablet by mouth every 4 hours if needed for pain  120 each  2  . L-Methylfolate-B6-B12 (VITACIRC-B PO) Take 1 tablet by mouth 2 (two) times daily.        . Lancets (FREESTYLE) lancets use twice a day  100 each  10  . LUTEIN PO Take by mouth as directed.        . meclizine (ANTIVERT) 12.5 MG tablet Take 1 tablet (12.5 mg total) by mouth 3 (three) times daily as needed.  90 tablet  3  . meclizine (ANTIVERT) 25 MG tablet Take 25 mg by mouth daily as  needed. 1 po prn for dizziness       . Multiple Vitamins-Minerals (ICAPS MV PO) Take by mouth daily.        . nabumetone (RELAFEN) 750 MG tablet Take 750 mg by mouth 2 (two) times daily.        Marland Kitchen omeprazole (PRILOSEC) 20 MG capsule take 1 capsule by mouth twice a day  60 capsule  5  . OVER THE COUNTER MEDICATION 1 tablet as needed. Antihistamine-decongestant       . predniSONE (STERAPRED UNI-PAK) 10 MG tablet Take 10 mg by mouth daily.        Marland Kitchen PRESCRIPTION MEDICATION Take 1 tablet by mouth daily. MyBertriq 25mg         Scheduled:    . amLODipine  5 mg Oral Daily  . benazepril  10 mg Oral Daily  . chlorhexidine  60 mL Topical Once  . insulin aspart  0-15 Units Subcutaneous TID WC  . insulin aspart  4 Units Subcutaneous TID WC  . pantoprazole  40 mg Oral Q1200  . piperacillin-tazobactam (ZOSYN)  IV  3.375 g Intravenous Q8H  . vancomycin  1,000 mg Intravenous Q12H  . DISCONTD: amLODipine-benazepril  1 capsule Oral Daily  . DISCONTD: piperacillin-tazobactam (ZOSYN)  IV  3.375 g Intravenous Q6H      Assessment: 77yo female admitted for open, nacrotic wound of left 5th metatarsal with abscess and cellulitis, on vanc and Zosyn, planning amputation, to begin Coumadin for VTE Px.  Of note, pt has been on Coumadin in past for provoked DVT/PE, D/C'd in 2010.  Goal of Therapy:  INR 2-3   Plan:  Will obtain baseline INR in am and f/u plan for surgery as would tend to avoid Coumadin just prior to surgery; may require SQ Lovenox or SQ heparin.  Colleen Can PharmD BCPS 03/05/2012,11:45 PM

## 2012-03-06 ENCOUNTER — Inpatient Hospital Stay: Admit: 2012-03-06 | Payer: Self-pay | Admitting: Orthopedic Surgery

## 2012-03-06 ENCOUNTER — Encounter (HOSPITAL_COMMUNITY): Payer: Self-pay | Admitting: Anesthesiology

## 2012-03-06 ENCOUNTER — Inpatient Hospital Stay (HOSPITAL_COMMUNITY): Payer: Medicare Other | Admitting: Anesthesiology

## 2012-03-06 ENCOUNTER — Encounter (HOSPITAL_COMMUNITY)
Admission: AD | Disposition: A | Discharge status: Skilled Nursing Facility | Payer: Self-pay | Source: Ambulatory Visit | Attending: Orthopedic Surgery

## 2012-03-06 HISTORY — PX: AMPUTATION: SHX166

## 2012-03-06 LAB — GLUCOSE, CAPILLARY
Glucose-Capillary: 169 mg/dL — ABNORMAL HIGH (ref 70–99)
Glucose-Capillary: 130 mg/dL — ABNORMAL HIGH (ref 70–99)
Glucose-Capillary: 144 mg/dL — ABNORMAL HIGH (ref 70–99)

## 2012-03-06 LAB — SURGICAL PCR SCREEN
MRSA, PCR: NEGATIVE
Staphylococcus aureus: NEGATIVE

## 2012-03-06 LAB — HEMOGLOBIN A1C
Hgb A1c MFr Bld: 6.8 % — ABNORMAL HIGH (ref <5.7)
Mean Plasma Glucose: 148 mg/dL — ABNORMAL HIGH (ref <117)

## 2012-03-06 LAB — PROTIME-INR
INR: 1.25 (ref 0.00–1.49)
Prothrombin Time: 16 seconds — ABNORMAL HIGH (ref 11.6–15.2)

## 2012-03-06 SURGERY — AMPUTATION, FOOT, PARTIAL
Anesthesia: General | Site: Foot | Laterality: Left | Wound class: Dirty or Infected

## 2012-03-06 MED ORDER — METHOCARBAMOL 100 MG/ML IJ SOLN
500.0000 mg | Freq: Four times a day (QID) | INTRAVENOUS | Status: DC | PRN
  Filled 2012-03-06: qty 5

## 2012-03-06 MED ORDER — FENTANYL CITRATE 0.05 MG/ML IJ SOLN
25.0000 ug | INTRAMUSCULAR | Status: DC | PRN
  Administered 2012-03-06: 50 ug via INTRAVENOUS
  Administered 2012-03-06 (×2): 25 ug via INTRAVENOUS

## 2012-03-06 MED ORDER — ONDANSETRON HCL 4 MG PO TABS
4.0000 mg | ORAL_TABLET | Freq: Four times a day (QID) | ORAL | Status: DC | PRN
  Administered 2012-03-08: 4 mg via ORAL
  Filled 2012-03-06: qty 1

## 2012-03-06 MED ORDER — LACTATED RINGERS IV SOLN
INTRAVENOUS | Status: DC | PRN
  Administered 2012-03-06: 10:00:00 via INTRAVENOUS

## 2012-03-06 MED ORDER — FENTANYL CITRATE 0.05 MG/ML IJ SOLN
INTRAMUSCULAR | Status: DC | PRN
  Administered 2012-03-06 (×2): 50 ug via INTRAVENOUS

## 2012-03-06 MED ORDER — METHOCARBAMOL 100 MG/ML IJ SOLN
500.0000 mg | INTRAVENOUS | Status: AC
  Administered 2012-03-06: 500 mg via INTRAVENOUS
  Filled 2012-03-06: qty 5

## 2012-03-06 MED ORDER — LACTATED RINGERS IV SOLN
INTRAVENOUS | Status: DC
  Administered 2012-03-06: 10:00:00 via INTRAVENOUS

## 2012-03-06 MED ORDER — ONDANSETRON HCL 4 MG/2ML IJ SOLN
4.0000 mg | Freq: Once | INTRAMUSCULAR | Status: AC | PRN
  Filled 2012-03-06: qty 2

## 2012-03-06 MED ORDER — MORPHINE SULFATE 2 MG/ML IJ SOLN
1.0000 mg | INTRAMUSCULAR | Status: DC | PRN
  Administered 2012-03-06 (×2): 1 mg via INTRAVENOUS
  Filled 2012-03-06 (×2): qty 1

## 2012-03-06 MED ORDER — PROPOFOL 10 MG/ML IV EMUL
INTRAVENOUS | Status: DC | PRN
  Administered 2012-03-06: 100 mg via INTRAVENOUS

## 2012-03-06 MED ORDER — 0.9 % SODIUM CHLORIDE (POUR BTL) OPTIME
TOPICAL | Status: DC | PRN
  Administered 2012-03-06: 1000 mL

## 2012-03-06 MED ORDER — ONDANSETRON HCL 4 MG/2ML IJ SOLN
4.0000 mg | Freq: Four times a day (QID) | INTRAMUSCULAR | Status: DC | PRN
  Administered 2012-03-06: 4 mg via INTRAVENOUS

## 2012-03-06 MED ORDER — METHOCARBAMOL 500 MG PO TABS
500.0000 mg | ORAL_TABLET | Freq: Four times a day (QID) | ORAL | Status: DC | PRN
  Administered 2012-03-08 – 2012-03-11 (×8): 500 mg via ORAL
  Filled 2012-03-06 (×8): qty 1

## 2012-03-06 MED ORDER — SODIUM CHLORIDE 0.9 % IV SOLN
750.0000 mg | Freq: Two times a day (BID) | INTRAVENOUS | Status: DC
  Administered 2012-03-06 – 2012-03-09 (×6): 750 mg via INTRAVENOUS
  Filled 2012-03-06 (×6): qty 750

## 2012-03-06 MED ORDER — HYDROMORPHONE HCL PF 1 MG/ML IJ SOLN: 0.2500 mg | INTRAMUSCULAR | Status: DC | PRN

## 2012-03-06 MED ORDER — SODIUM CHLORIDE 0.9 % IV SOLN
INTRAVENOUS | Status: DC
  Administered 2012-03-07: 20 mL/h via INTRAVENOUS

## 2012-03-06 MED ORDER — GLYCOPYRROLATE 0.2 MG/ML IJ SOLN
INTRAMUSCULAR | Status: DC | PRN
  Administered 2012-03-06: 0.6 mg via INTRAVENOUS

## 2012-03-06 MED ORDER — WARFARIN - PHARMACIST DOSING INPATIENT: Freq: Every day | Status: DC

## 2012-03-06 MED ORDER — COUMADIN BOOK
Freq: Once | Status: AC
  Administered 2012-03-06: 17:00:00
  Filled 2012-03-06: qty 1

## 2012-03-06 MED ORDER — METOCLOPRAMIDE HCL 5 MG/ML IJ SOLN: 5.0000 mg | Freq: Three times a day (TID) | INTRAMUSCULAR | Status: DC | PRN

## 2012-03-06 MED ORDER — MIDAZOLAM HCL 5 MG/5ML IJ SOLN
INTRAMUSCULAR | Status: DC | PRN
  Administered 2012-03-06: 1 mg via INTRAVENOUS

## 2012-03-06 MED ORDER — METOCLOPRAMIDE HCL 10 MG PO TABS: 5.0000 mg | ORAL_TABLET | Freq: Three times a day (TID) | ORAL | Status: DC | PRN

## 2012-03-06 MED ORDER — LIDOCAINE HCL (CARDIAC) 20 MG/ML IV SOLN
INTRAVENOUS | Status: DC | PRN
  Administered 2012-03-06: 40 mg via INTRAVENOUS

## 2012-03-06 MED ORDER — WARFARIN SODIUM 5 MG PO TABS
5.0000 mg | ORAL_TABLET | Freq: Once | ORAL | Status: AC
  Administered 2012-03-06: 5 mg via ORAL
  Filled 2012-03-06 (×2): qty 1

## 2012-03-06 MED ORDER — ONDANSETRON HCL 4 MG/2ML IJ SOLN
INTRAMUSCULAR | Status: DC | PRN
  Administered 2012-03-06: 4 mg via INTRAVENOUS

## 2012-03-06 MED ORDER — ROCURONIUM BROMIDE 100 MG/10ML IV SOLN
INTRAVENOUS | Status: DC | PRN
  Administered 2012-03-06: 30 mg via INTRAVENOUS

## 2012-03-06 MED ORDER — NEOSTIGMINE METHYLSULFATE 1 MG/ML IJ SOLN
INTRAMUSCULAR | Status: DC | PRN
  Administered 2012-03-06: 4 mg via INTRAVENOUS

## 2012-03-06 SURGICAL SUPPLY — 38 items
BANDAGE GAUZE ELAST BULKY 4 IN (GAUZE/BANDAGES/DRESSINGS) ×3 IMPLANT
BLADE SAW SGTL HD 18.5X60.5X1. (BLADE) ×2 IMPLANT
BLADE SURG 10 STRL SS (BLADE) IMPLANT
BNDG COHESIVE 4X5 TAN STRL (GAUZE/BANDAGES/DRESSINGS) ×3 IMPLANT
CLOTH BEACON ORANGE TIMEOUT ST (SAFETY) ×2 IMPLANT
COVER SURGICAL LIGHT HANDLE (MISCELLANEOUS) ×2 IMPLANT
CUFF TOURNIQUET SINGLE 34IN LL (TOURNIQUET CUFF) IMPLANT
CUFF TOURNIQUET SINGLE 44IN (TOURNIQUET CUFF) IMPLANT
DRAPE U-SHAPE 47X51 STRL (DRAPES) ×2 IMPLANT
DRSG ADAPTIC 3X8 NADH LF (GAUZE/BANDAGES/DRESSINGS) ×2 IMPLANT
DRSG PAD ABDOMINAL 8X10 ST (GAUZE/BANDAGES/DRESSINGS) ×1 IMPLANT
DURAPREP 26ML APPLICATOR (WOUND CARE) ×2 IMPLANT
ELECT REM PT RETURN 9FT ADLT (ELECTROSURGICAL) ×2
ELECTRODE REM PT RTRN 9FT ADLT (ELECTROSURGICAL) ×1 IMPLANT
GLOVE BIOGEL PI IND STRL 9 (GLOVE) ×1 IMPLANT
GLOVE BIOGEL PI INDICATOR 9 (GLOVE) ×1
GLOVE SURG ORTHO 9.0 STRL STRW (GLOVE) ×2 IMPLANT
GOWN PREVENTION PLUS XLARGE (GOWN DISPOSABLE) ×2 IMPLANT
GOWN SRG XL XLNG 56XLVL 4 (GOWN DISPOSABLE) ×2 IMPLANT
GOWN STRL NON-REIN XL XLG LVL4 (GOWN DISPOSABLE) ×4
KIT BASIN OR (CUSTOM PROCEDURE TRAY) ×2 IMPLANT
KIT ROOM TURNOVER OR (KITS) ×2 IMPLANT
MANIFOLD NEPTUNE II (INSTRUMENTS) ×2 IMPLANT
NS IRRIG 1000ML POUR BTL (IV SOLUTION) ×2 IMPLANT
PACK ORTHO EXTREMITY (CUSTOM PROCEDURE TRAY) ×2 IMPLANT
PAD ARMBOARD 7.5X6 YLW CONV (MISCELLANEOUS) ×4 IMPLANT
PAD CAST 4YDX4 CTTN HI CHSV (CAST SUPPLIES) ×1 IMPLANT
PADDING CAST COTTON 4X4 STRL (CAST SUPPLIES) ×2
SPONGE GAUZE 4X4 12PLY (GAUZE/BANDAGES/DRESSINGS) ×2 IMPLANT
SPONGE LAP 18X18 X RAY DECT (DISPOSABLE) ×2 IMPLANT
STAPLER VISISTAT 35W (STAPLE) ×2 IMPLANT
SUT ETHILON 2 0 PSLX (SUTURE) ×6 IMPLANT
SUT VIC AB 2-0 CTB1 (SUTURE) ×4 IMPLANT
TOWEL OR 17X24 6PK STRL BLUE (TOWEL DISPOSABLE) ×2 IMPLANT
TOWEL OR 17X26 10 PK STRL BLUE (TOWEL DISPOSABLE) ×2 IMPLANT
TUBE CONNECTING 12X1/4 (SUCTIONS) ×2 IMPLANT
WATER STERILE IRR 1000ML POUR (IV SOLUTION) ×2 IMPLANT
YANKAUER SUCT BULB TIP NO VENT (SUCTIONS) ×2 IMPLANT

## 2012-03-06 NOTE — Op Note (Signed)
OPERATIVE REPORT  DATE OF SURGERY: 03/06/2012  PATIENT:  [name],  77 y.o. 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 She is developed cellulitis abscess osteomyelitis with a Wagner grade 3 ulcer due to failure conservative treatment patient was admitted last night started on IV vancomycin and Zosyn and presents at this time for surgical intervention. Risks and benefits were discussed including infection neurovascular injury persistent pain nonhealing of the wound need for additional surgery patient states she understands and wished to proceed at this time. Description of procedure patient brought to or room for and underwent a general anesthetic. After adequate levels of anesthesia were obtained patient's left lower extremity was prepped using DuraPrep and draped into a sterile field. A incision was made longitudinally which encompassed the ulcer the ulcer soft tissue and fifth metatarsal including the fifth toe were resected in one block of tissue. The osteomyelitis involved the base of the fifth metatarsal and salvage of the base of the fifth metatarsal was not possible. The wound is irrigated with normal saline hemostasis was obtained. The skin was closed using 2-0 nylon. The wound edges approximated well. The wound was covered with Adaptic orthopedic sponges AB dressing Kerlix and Coban. Patient was extubated taken to the PACU in stable condition. Anticipate patient will require short-term skilled nursing  placement.  PLAN OF CARE: Admit to inpatient   PATIENT DISPOSITION:  PACU - hemodynamically stable.   Nadara Mustard, MD 03/06/2012 11:26 AM

## 2012-03-06 NOTE — Transfer of Care (Signed)
Immediate Anesthesia Transfer of Care Note  Patient: [name]  Procedure(s) Performed: Procedure(s) (LRB): AMPUTATION FOOT (Left)  Patient Location: PACU  Anesthesia Type: General  Level of Consciousness: awake, alert , oriented and patient cooperative  Airway & Oxygen Therapy: Patient Spontanous Breathing and Patient connected to nasal cannula oxygen  Post-op Assessment: Report given to PACU RN, Post -op Vital signs reviewed and stable and Patient moving all extremities  Post vital signs: Reviewed and stable  Complications: No apparent anesthesia complications

## 2012-03-06 NOTE — Progress Notes (Signed)
Orthopedic Tech Progress Note Patient Details:  [name] 06-26-1935 [id number]  Other Ortho Devices Type of Ortho Device: Postop boot Ortho Device Location: left foot Ortho Device Interventions: Application   Larinda Herter T 03/06/2012, 1:28 PM

## 2012-03-06 NOTE — Anesthesia Preprocedure Evaluation (Signed)
Anesthesia Evaluation  Patient identified by MRN, date of birth, ID band Patient awake    Reviewed: Allergy & Precautions, H&P , NPO status , Patient's Chart, lab work & pertinent test results  Airway Mallampati: II      Dental  (+) Teeth Intact and Dental Advisory Given   Pulmonary  breath sounds clear to auscultation        Cardiovascular Rhythm:Regular Rate:Normal     Neuro/Psych    GI/Hepatic   Endo/Other    Renal/GU      Musculoskeletal   Abdominal (+)  Abdomen: soft.    Peds  Hematology   Anesthesia Other Findings   Reproductive/Obstetrics                           Anesthesia Physical Anesthesia Plan  ASA: III  Anesthesia Plan: General   Post-op Pain Management:    Induction:   Airway Management Planned: LMA  Additional Equipment:   Intra-op Plan:   Post-operative Plan:   Informed Consent: I have reviewed the patients History and Physical, chart, labs and discussed the procedure including the risks, benefits and alternatives for the proposed anesthesia with the patient or authorized representative who has indicated his/her understanding and acceptance.   Dental advisory given  Plan Discussed with:   Anesthesia Plan Comments: (RSD with nonviable toes L. Foot Type 2 DM glucose 103 Htn H/O Bilat PEs following ACDF 2011  Plan GA with LMA  )        Anesthesia Quick Evaluation

## 2012-03-06 NOTE — Preoperative (Signed)
Beta Blockers   Reason not to administer Beta Blockers:Not Applicable 

## 2012-03-06 NOTE — Interval H&P Note (Signed)
History and Physical Interval Note:  03/06/2012 6:12 AM  [name]  has presented today for surgery, with the diagnosis of ABESS ASTEO LEFT FOOT  The various methods of treatment have been discussed with the patient and family. After consideration of risks, benefits and other options for treatment, the patient has consented to  Procedure(s) (LRB): AMPUTATION FOOT fifth ray (Left) as a surgical intervention .  The patients' history has been reviewed, patient examined, no change in status, stable for surgery.  I have reviewed the patients' chart and labs.  Questions were answered to the patient's satisfaction.     Teddie Mehta V

## 2012-03-06 NOTE — Anesthesia Procedure Notes (Signed)
Procedure Name: Intubation Date/Time: 03/06/2012 10:43 AM Performed by: Darcey Nora B Pre-anesthesia Checklist: Patient identified, Emergency Drugs available, Suction available and Patient being monitored Patient Re-evaluated:Patient Re-evaluated prior to inductionOxygen Delivery Method: Circle system utilized Preoxygenation: Pre-oxygenation with 100% oxygen Intubation Type: IV induction Ventilation: Mask ventilation without difficulty and Oral airway inserted - appropriate to patient size Laryngoscope Size: Mac and 3 Grade View: Grade I Tube type: Oral Tube size: 7.5 mm Number of attempts: 1 Airway Equipment and Method: Stylet Secured at: 21 (cm at teeth) cm Tube secured with: Tape Dental Injury: Teeth and Oropharynx as per pre-operative assessment

## 2012-03-06 NOTE — Progress Notes (Signed)
ANTIBIOTIC/ANTICOAGULATION CONSULT NOTE - INITIAL  Pharmacy Consult for Vancomycin, Zosyn and Coumadin Indication: Toe osteomyelitis/necrotic ulcer; VTE prophylaxis s/p toe amputation  Allergies  Allergen Reactions  . Adhesive (Tape)     rash  . Clorazepate Dipotassium   . Darifenacin Hydrobromide     REACTION: hypotension, near syncope  . Dilaudid (Hydromorphone Hcl)     REACTION: confused, intense itching  . Doxycycline     REACTION: RASH  . Methadone Hcl   . Pentazocine Lactate   . Pentazocine Lactate     Altered mental state    Patient Measurements:  Weight: 88kg   Vital Signs: Temp: 98.2 F (36.8 C) (05/15 1300) BP: 109/68 mmHg (05/15 1300) Pulse Rate: 77  (05/15 1300) Intake/Output from previous day: 05/14 0701 - 05/15 0700 In: 500 [IV Piggyback:500] Out: -  Intake/Output from this shift: Total I/O In: 1100 [I.V.:1100] Out: -   Labs:  [facility] 03/05/12 2045  WBC 8.1  HGB 10.7*  PLT 199  LABCREA --  CREATININE 1.16*   The CrCl is unknown because both a height and weight (above a minimum accepted value) are required for this calculation. No results found for this basename: VANCOTROUGH:2,VANCOPEAK:2,VANCORANDOM:2,GENTTROUGH:2,GENTPEAK:2,GENTRANDOM:2,TOBRATROUGH:2,TOBRAPEAK:2,TOBRARND:2,AMIKACINPEAK:2,AMIKACINTROU:2,AMIKACIN:2, in the last 72 hours   Microbiology: Recent Results (from the past 720 hour(s))  SURGICAL PCR SCREEN     Status: Normal   Collection Time   03/05/12 11:06 PM      Component Value Range Status Comment   MRSA, PCR NEGATIVE  NEGATIVE  Final    Staphylococcus aureus NEGATIVE  NEGATIVE  Final     Medical History: Past Medical History  Diagnosis Date  . Hypertension   . Hyperlipidemia   . Diabetes mellitus   . Asthma   . Osteoporosis   . DVT (deep venous thrombosis) 12/2007    PE DVT after neck surgery, coumadin d/c 10-2008  . PE (pulmonary embolism) 12/2007  . Hiatal hernia     s/p repair   . Pulmonary nodule     incidental  per CT:  Pet scan 4-9: likely benign, CT 08-2009 no change, no further CTs (Dr. Shelle Iron)  . Hemorrhoids   . IBS (irritable bowel syndrome)   . Colon polyp 1994    malignant  . Diverticulosis   . PPD positive   . Macular degeneration 03/2009    Dr. Earlene Plater  . Allergic rhinitis   . Dyspnea     chronic, extensive w/u see OV note 09-2010  . RSD (reflex sympathetic dystrophy)   . Hx of colonoscopy   . DJD (degenerative joint disease)   . Spinal stenosis   . Complication of anesthesia   . PONV (postoperative nausea and vomiting)   . GERD (gastroesophageal reflux disease)     Medications:  Prescriptions prior to admission  Medication Sig Dispense Refill  . amLODipine-benazepril (LOTREL) 5-10 MG per capsule take 1 capsule by mouth once daily  30 capsule  5  . aspirin 81 MG tablet Take 81 mg by mouth daily.        Marland Kitchen bismuth subsalicylate (PEPTO BISMOL) 262 MG/15ML suspension Take 15 mLs by mouth as needed.        . Cholecalciferol (VITAMIN D) 2000 UNITS tablet Take 5,000 Units by mouth daily. 3 tabs daily      . diclofenac sodium (VOLTAREN) 1 % GEL Apply topically as needed. Apply prn to ankle per pt.       Marland Kitchen FREESTYLE LITE test strip CHECK BLOOD SUGAR TWICE A DAY  100 each  10  . Hydrocodone-Acetaminophen 10-660 MG TABS take 1 tablet by mouth every 4 hours if needed for pain  120 each  2  . L-Methylfolate-B6-B12 (VITACIRC-B PO) Take 1 tablet by mouth 2 (two) times daily.        . Lancets (FREESTYLE) lancets use twice a day  100 each  10  . LUTEIN PO Take by mouth as directed.        . meclizine (ANTIVERT) 12.5 MG tablet Take 1 tablet (12.5 mg total) by mouth 3 (three) times daily as needed.  90 tablet  3  . meclizine (ANTIVERT) 25 MG tablet Take 25 mg by mouth daily as needed. 1 po prn for dizziness       . Multiple Vitamins-Minerals (ICAPS MV PO) Take by mouth daily.        . nabumetone (RELAFEN) 750 MG tablet Take 750 mg by mouth 2 (two) times daily.        Marland Kitchen omeprazole (PRILOSEC) 20 MG  capsule take 1 capsule by mouth twice a day  60 capsule  5  . OVER THE COUNTER MEDICATION 1 tablet as needed. Antihistamine-decongestant       . predniSONE (STERAPRED UNI-PAK) 10 MG tablet Take 10 mg by mouth daily.        Marland Kitchen PRESCRIPTION MEDICATION Take 1 tablet by mouth daily. MyBertriq 25mg         Assessment: 1. 76yof to continue Vancomycin and Zosyn for toe osteomyelitis and necrotic ulcer s/p toe amputation today (5/15). MD had originally dosed Vancomycin 1g q12h - pharmacy now consulted to manage.  - Weight: 88kg - SCr 1.16, CrCl ~45 mlmin  2. Patient will also be started on Coumadin for VTE prophylaxis s/p amputation with h/o DVT/PE following surgery. Called patient's pharmacy to obtain previous Coumadin records but pharmacy personnel could not find appropriate information. - Baseline INR: 1.25 - AST/ALT wnl - Hg 10.7, Plts 199 - Warfarin points: 2  Goal of Therapy:  Vancomycin trough level 15-20 mcg/ml INR 2-3  Plan:  1. Change Vancomycin to 750mg  IV q12h - next dose @ 1900 tonight 2. Continue Zosyn 3.375g IV q8h - infuse over 4hrs 3. Monitor renal function, cultures, antibiotic plan and order levels as indicated  4. Coumadin 5mg  po x 1 today 5. Daily PT/INR 6. Coumadin book  [name] [phone] 03/06/2012,2:15 PM

## 2012-03-06 NOTE — Anesthesia Postprocedure Evaluation (Signed)
  Anesthesia Post-op Note  Patient: [name]  Procedure(s) Performed: Procedure(s) (LRB): AMPUTATION FOOT (Left)  Patient Location: PACU  Anesthesia Type: General  Level of Consciousness: awake, alert  and oriented  Airway and Oxygen Therapy: Patient connected to nasal cannula oxygen  Post-op Pain: mild  Post-op Assessment: Post-op Vital signs reviewed and Patient's Cardiovascular Status Stable  Post-op Vital Signs: stable  Complications: No apparent anesthesia complications

## 2012-03-06 NOTE — Progress Notes (Signed)
UR COMPLETED  

## 2012-03-07 ENCOUNTER — Encounter (HOSPITAL_COMMUNITY): Payer: Self-pay | Admitting: Orthopedic Surgery

## 2012-03-07 ENCOUNTER — Ambulatory Visit: Payer: Medicare Other | Admitting: Internal Medicine

## 2012-03-07 DIAGNOSIS — M86172 Other acute osteomyelitis, left ankle and foot: Secondary | ICD-10-CM | POA: Diagnosis present

## 2012-03-07 LAB — PROTIME-INR
INR: 1.18 (ref 0.00–1.49)
Prothrombin Time: 15.3 seconds — ABNORMAL HIGH (ref 11.6–15.2)

## 2012-03-07 LAB — GLUCOSE, CAPILLARY
Glucose-Capillary: 163 mg/dL — ABNORMAL HIGH (ref 70–99)
Glucose-Capillary: 114 mg/dL — ABNORMAL HIGH (ref 70–99)

## 2012-03-07 MED ORDER — DIPHENHYDRAMINE HCL 25 MG PO CAPS
25.0000 mg | ORAL_CAPSULE | Freq: Four times a day (QID) | ORAL | Status: DC | PRN
  Administered 2012-03-08 – 2012-03-10 (×3): 25 mg via ORAL
  Filled 2012-03-07 (×3): qty 1

## 2012-03-07 MED ORDER — PSEUDOEPHEDRINE HCL 30 MG PO TABS
30.0000 mg | ORAL_TABLET | Freq: Four times a day (QID) | ORAL | Status: DC | PRN
  Administered 2012-03-07 – 2012-03-10 (×4): 30 mg via ORAL
  Filled 2012-03-07 (×4): qty 1

## 2012-03-07 MED ORDER — WARFARIN SODIUM 5 MG PO TABS
5.0000 mg | ORAL_TABLET | Freq: Once | ORAL | Status: AC
  Administered 2012-03-07: 5 mg via ORAL
  Filled 2012-03-07: qty 1

## 2012-03-07 NOTE — Evaluation (Signed)
Occupational Therapy Evaluation Patient Details Name: [name] MRN: [id number] DOB: 09/14/1935 Today's Date: 03/07/2012 Time: 4098-1191 OT Time Calculation (min): 24 min  OT Assessment / Plan / Recommendation Clinical Impression  Pt is a 77 yo female who presents with 5th ray amputation due to infection and osteomyelitis. Skilled OT recommended to maximize independence with BADLs to supervision level in prep for d/c to next venue of care.    OT Assessment  Patient needs continued OT Services    Follow Up Recommendations  Skilled nursing facility    Barriers to Discharge Inaccessible home environment;Decreased caregiver support    Equipment Recommendations  Defer to next venue    Recommendations for Other Services    Frequency  Min 2X/week    Precautions / Restrictions Precautions Precautions: Fall Required Braces or Orthoses: Other Brace/Splint Other Brace/Splint: Post op show on LLE Restrictions Weight Bearing Restrictions: Yes LLE Weight Bearing: Non weight bearing   Pertinent Vitals/Pain     ADL  Grooming: Performed;Wash/dry face Where Assessed - Grooming: Unsupported sitting Upper Body Bathing: Performed;Set up Where Assessed - Upper Body Bathing: Unsupported sitting Lower Body Bathing: Performed;Minimal assistance (for back peri area) Where Assessed - Lower Body Bathing: Supported sit to stand Upper Body Dressing: Performed;Set up Where Assessed - Upper Body Dressing: Unsupported sitting Lower Body Dressing: Performed;Minimal assistance Where Assessed - Lower Body Dressing: Unsupported sitting (to don R sock.) Toilet Transfer: Performed;Minimal assistance Toilet Transfer Method: Stand pivot;Sit to Barista: Bedside commode Toileting - Clothing Manipulation and Hygiene: Performed;Min guard Where Assessed - Engineer, mining and Hygiene: Sit to stand from 3-in-1 or toilet Tub/Shower Transfer Method: Not assessed Equipment  Used: Rolling walker;Other (comment) (3:1) ADL Comments: Max cues for hand placement during transfers and maintaining NWB status to LLE.    OT Diagnosis: Generalized weakness  OT Problem List: Decreased activity tolerance;Decreased safety awareness;Decreased knowledge of use of DME or AE;Decreased knowledge of precautions;Pain;Impaired sensation OT Treatment Interventions: Self-care/ADL training;Therapeutic activities;DME and/or AE instruction;Patient/family education   OT Goals Acute Rehab OT Goals OT Goal Formulation: With patient Time For Goal Achievement: 03/21/12 Potential to Achieve Goals: Good ADL Goals Pt Will Perform Grooming: with supervision;Standing at sink ADL Goal: Grooming - Progress: Goal set today Pt Will Perform Lower Body Bathing: with supervision;Sit to stand from chair;Sit to stand from bed ADL Goal: Lower Body Bathing - Progress: Goal set today Pt Will Perform Lower Body Dressing: with supervision;Sit to stand from bed;Sit to stand from chair ADL Goal: Lower Body Dressing - Progress: Goal set today Pt Will Transfer to Toilet: with supervision;Maintaining weight bearing status;3-in-1;Ambulation;Comfort height toilet ADL Goal: Toilet Transfer - Progress: Goal set today Pt Will Perform Toileting - Clothing Manipulation: with supervision;Sitting on 3-in-1 or toilet;Standing ADL Goal: Toileting - Clothing Manipulation - Progress: Goal set today Pt Will Perform Toileting - Hygiene: with supervision;Sit to stand from 3-in-1/toilet ADL Goal: Toileting - Hygiene - Progress: Goal set today  Visit Information  Last OT Received On: 03/07/12 Assistance Needed: +1    Subjective Data  Subjective: I'm going to have to go to [facility] when I leave here... Patient Stated Goal: Be able to play with my dogs.   Prior Functioning  Home Living Lives With: Alone Available Help at Discharge: Skilled Nursing Facility Type of Home: House Home Access: Stairs to enter Additional  Comments: pt plans on D/C to [facility] for rehab prior to D/C to home.   Prior Function Level of Independence: Independent Able to Take Stairs?: Yes  Driving: Yes Communication Communication: No difficulties Dominant Hand: Right    Cognition  Overall Cognitive Status: Appears within functional limits for tasks assessed/performed Arousal/Alertness: Awake/alert Orientation Level: Appears intact for tasks assessed Behavior During Session: [facility] for tasks performed    Extremity/Trunk Assessment Right Upper Extremity Assessment RUE ROM/Strength/Tone: [facility] for tasks assessed RUE Sensation:  (pt also with a hx of CTS) RUE Coordination: WFL - gross/fine motor Left Upper Extremity Assessment LUE ROM/Strength/Tone: WFL for tasks assessed LUE Sensation:  (pt also with a hx of CTS) LUE Coordination: WFL - gross/fine motor    Mobility  Transfers Sit to Stand: 4: Min assist;With upper extremity assist;From chair/3-in-1 Stand to Sit: 4: Min assist;With upper extremity assist;With armrests;To chair/3-in-1 Details for Transfer Assistance: Max cues for hand placement and maintaining NWB to LLE.   Exercise    Balance Balance Balance Assessed: No  End of Session OT - End of Session Activity Tolerance: Patient tolerated treatment well Patient left: in chair;with call bell/phone within reach   Isolde Skaff A OTR/L [phone] 03/07/2012, 11:30 AM

## 2012-03-07 NOTE — Progress Notes (Signed)
ANTIBIOTIC/ANTICOAGULATION CONSULT NOTE - Follow Up Consult  Pharmacy Consult for warfarin, vanc/zosyn Indication: VTE prophylaxis s/p toe amputation, osteomyelitis - R toe necrotic ulcer s/p amputation  Allergies  Allergen Reactions  . Adhesive (Tape)     rash  . Cefazolin Hives  . Clorazepate Dipotassium   . Darifenacin Hydrobromide     REACTION: hypotension, near syncope  . Dilaudid (Hydromorphone Hcl)     REACTION: confused, intense itching  . Doxycycline     REACTION: RASH  . Enablex (Darifenacin Hydrobromide Er) Other (See Comments)    Reaction unknown  . Methadone Hcl Other (See Comments)    Reaction unknown  . Pentazocine Lactate     Altered mental state    Patient Measurements: Wt: 88 kg   Vital Signs: Temp: 98.5 F (36.9 C) (05/16 0525) BP: 117/56 mmHg (05/16 0525) Pulse Rate: 78  (05/16 0525)  Labs:  Basename 03/07/12 0505 03/06/12 0528 03/05/12 2045  HGB -- -- 10.7*  HCT -- -- 33.4*  PLT -- -- 199  APTT -- -- --  LABPROT 15.3* 16.0* --  INR 1.18 1.25 --  HEPARINUNFRC -- -- --  CREATININE -- -- 1.16*  CKTOTAL -- -- --  CKMB -- -- --  TROPONINI -- -- --    The CrCl is unknown because both a height and weight (above a minimum accepted value) are required for this calculation.   Medications:  Scheduled:    . amLODipine  5 mg Oral Daily  . benazepril  10 mg Oral Daily  . coumadin book   Does not apply Once  . insulin aspart  0-15 Units Subcutaneous TID WC  . insulin aspart  4 Units Subcutaneous TID WC  . methocarbamol (ROBAXIN) IV  500 mg Intravenous To PACU  . pantoprazole  40 mg Oral Q1200  . piperacillin-tazobactam (ZOSYN)  IV  3.375 g Intravenous Q8H  . vancomycin  750 mg Intravenous Q12H  . warfarin  5 mg Oral ONCE-1800  . Warfarin - Pharmacist Dosing Inpatient   Does not apply q1800  . DISCONTD: vancomycin  1,000 mg Intravenous Q12H   Infusions:    . sodium chloride    . sodium chloride 20 mL/hr (03/07/12 0406)  . lactated ringers  50 mL/hr at 03/06/12 4098    Assessment: 77 yo F s/p toe amputation with h/o DVT/PE.  Warfarin points: 2.  Goal of Therapy:  INR 2-3; vancomycin trough 15-20 Monitor platelets by anticoagulation protocol: Yes   Plan:  - Coumadin 5 mg po x1 tonight - Daily PT/INR - Continue vancomycin 3.375G IV q8h given over 4h - Continue vancomycin 750 mg IV q12h - Consider vancomycin trough at Css -  Monitor renal function, clinical progress, LOT  Victorhugo Preis L. Illene Bolus, PharmD, BCPS Clinical Pharmacist Pager: [phone] 03/07/2012 8:28 AM

## 2012-03-07 NOTE — Evaluation (Signed)
Physical Therapy Evaluation Patient Details Name: [name] MRN: [id number] DOB: 11/13/1934 Today's Date: 03/07/2012 Time: 9629-5284 PT Time Calculation (min): 20 min  PT Assessment / Plan / Recommendation Clinical Impression  pt presents with L 5th ray amputation.  pt with chronic pain and notes severe pain in back, L wrist, and R hip during mobility.  pt encouraged to bring in R sneaker for mobility.  pt will need ST-SNF at D/C.      PT Assessment  Patient needs continued PT services    Follow Up Recommendations  Skilled nursing facility    Barriers to Discharge None      lEquipment Recommendations  Defer to next venue    Recommendations for Other Services     Frequency Min 5X/week    Precautions / Restrictions Precautions Precautions: Fall Required Braces or Orthoses: Other Brace/Splint Other Brace/Splint: Post-op shoe on L LE.   Restrictions Weight Bearing Restrictions: Yes LLE Weight Bearing: Non weight bearing   Pertinent Vitals/Pain Initially 6/10 in back, then notes severe pain in back, L wrist, and R hip during mobility.        Mobility  Bed Mobility Bed Mobility: Supine to Sit;Sitting - Scoot to Edge of Bed Supine to Sit: 6: Modified independent (Device/Increase time);With rails;HOB elevated Sitting - Scoot to Edge of Bed: 6: Modified independent (Device/Increase time) Transfers Transfers: Sit to Stand;Stand to Sit Sit to Stand: 4: Min assist;With upper extremity assist;From bed Stand to Sit: 4: Min assist;With upper extremity assist;With armrests;To chair/3-in-1 Details for Transfer Assistance: cues for use of UEs, NWBing as pt tends to put foot on floor during transfer despite cueing.   Ambulation/Gait Ambulation/Gait Assistance: 4: Min assist Ambulation Distance (Feet): 5 Feet Assistive device: Rolling walker Ambulation/Gait Assistance Details: pt with minimla sensation in L LE so doesn't always feel that she is WBing on L LE.  cues for safe use of  RW, sequencing, and encouragement.   Gait Pattern: Step-to pattern;Trunk flexed Stairs: No Wheelchair Mobility Wheelchair Mobility: No    Exercises     PT Diagnosis: Difficulty walking (Chronic Pain)  PT Problem List: Decreased strength;Decreased activity tolerance;Decreased balance;Decreased mobility;Decreased knowledge of use of DME;Decreased knowledge of precautions;Pain;Impaired sensation PT Treatment Interventions: DME instruction;Gait training;Stair training;Functional mobility training;Therapeutic activities;Therapeutic exercise;Balance training;Patient/family education   PT Goals Acute Rehab PT Goals PT Goal Formulation: With patient Time For Goal Achievement: 03/14/12 Potential to Achieve Goals: Good Pt will go Sit to Supine/Side: Independently PT Goal: Sit to Supine/Side - Progress: Goal set today Pt will go Sit to Stand: with supervision PT Goal: Sit to Stand - Progress: Goal set today Pt will go Stand to Sit: with supervision PT Goal: Stand to Sit - Progress: Goal set today Pt will Ambulate: 16 - 50 feet;with supervision;with rolling walker PT Goal: Ambulate - Progress: Goal set today  Visit Information  Last PT Received On: 03/07/12 Assistance Needed: +1    Subjective Data  Subjective: I have chronic pain and everything else hurts worse than my foot does.   Patient Stated Goal: Walk normal   Prior Functioning  Home Living Lives With: Alone Available Help at Discharge: Family;Available PRN/intermittently Type of Home: House Home Access: Stairs to enter Additional Comments: pt plans on D/C to [facility] for rehab prior to D/C to home.   Prior Function Level of Independence: Independent Able to Take Stairs?: Yes Driving: Yes Communication Communication: No difficulties    Cognition  Overall Cognitive Status: Appears within functional limits for tasks assessed/performed Arousal/Alertness: Awake/alert  Orientation Level: Oriented X4 / Intact Behavior  During Session: [facility] for tasks performed    Extremity/Trunk Assessment Right Lower Extremity Assessment RLE ROM/Strength/Tone: Within functional levels RLE Sensation: History of peripheral neuropathy Left Lower Extremity Assessment LLE ROM/Strength/Tone: WFL for tasks assessed (except where bandaged.  ) LLE Sensation: History of peripheral neuropathy   Balance Balance Balance Assessed: No  End of Session PT - End of Session Equipment Utilized During Treatment: Gait belt Activity Tolerance: Patient limited by pain Patient left: in chair;with call bell/phone within reach Nurse Communication: Mobility status   Sunny Schlein, [place] [phone] 03/07/2012, 9:25 AM

## 2012-03-07 NOTE — Clinical Social Work Psychosocial (Signed)
     Clinical Social Work Department BRIEF PSYCHOSOCIAL ASSESSMENT 03/07/2012  Patient:  [name], [name]     Account Number:  [id number]     Admit date:  03/05/2012  Clinical Social Worker:  Dennison Bulla  Date/Time:  03/07/2012 01:00 PM  Referred by:  Physician  Date Referred:  03/07/2012 Referred for  SNF Placement   Other Referral:   Interview type:  Patient Other interview type:    PSYCHOSOCIAL DATA Living Status:  ALONE Admitted from facility:   Level of care:   Primary support name:  [name] Primary support relationship to patient:  CHILD, ADULT Degree of support available:   Adequate    CURRENT CONCERNS Current Concerns  Post-Acute Placement   Other Concerns:    SOCIAL WORK ASSESSMENT / PLAN CSW received referral from MD to assist with SNF placement. CSW reviewed chart and met with patient at bedside. No visitors were present.    CSW introduced myself and explained CSW role. CSW spoke with patient regarding her living situation before admission to hospital and plans at dc. Patient reports she had planned on staying with son in [facility] but feels SNF would be more appropriate now. CSW spoke with patient regarding SNF process and left her with SNF list. Patient reported she would be interested in [facility] or [facility]. CSW explained SNF benefits of Medicare. Patient stated she would inform family of details.    CSW completed FL2 and faxed out. CSW attempted to submitt pasarr but website is currently down. CSW will continue to follow.   Assessment/plan status:  Psychosocial Support/Ongoing Assessment of Needs Other assessment/ plan:   Information/referral to community resources:   SNF list    PATIENTS/FAMILYS RESPONSE TO PLAN OF CARE: Patient was alert and oriented and eating lunch when CSW arrived. Patient is agreeable to SNF. Patient stated she would inform family. Patient has CSW contact information if needed.

## 2012-03-07 NOTE — Progress Notes (Signed)
Subjective: Pt comfortable.  Very little foot pain but "usual body aches".  Sitting in chair.  Agrees with need for short term NHP at discharge.  Will be here until Monday for IV abx. No nausea or vomiting. History of bilateral PEs with filter.  Currently on coumadin per pharm.  C/O seasonal allergy symptoms for which she usually uses benadryl and pseudoephedrine OTC Objective: Vital signs in last 24 hours: Temp:  [97.5 F (36.4 C)-99 F (37.2 C)] 98.5 F (36.9 C) (05/16 0525) Pulse Rate:  [72-95] 78  (05/16 0525) Resp:  [11-21] 18  (05/16 0525) BP: (102-137)/(47-90) 117/56 mmHg (05/16 0525) SpO2:  [95 %-100 %] 95 % (05/16 0525)  Intake/Output from previous day: 05/15 0701 - 05/16 0700 In: 1100 [I.V.:1100] Out: -  Intake/Output this shift:     Basename 03/05/12 2045  HGB 10.7*    Basename 03/05/12 2045  WBC 8.1  RBC 3.76*  HCT 33.4*  PLT 199    Basename 03/05/12 2045  NA 136  K 4.3  CL 104  CO2 21  BUN 22  CREATININE 1.16*  GLUCOSE 193*  CALCIUM 9.3    Basename 03/07/12 0505 03/06/12 0528  LABPT -- --  INR 1.18 1.25    Incision: dressing C/D/I Cap refill of toes intact Assessment/Plan: POD1 5th ray amputation of infection and osteomyelitis:  On IV abx.  SNF next week for rehab.  Social work consult   [name] M 03/07/2012, 10:48 AM

## 2012-03-07 NOTE — Clinical Social Work Placement (Addendum)
    Clinical Social Work Department CLINICAL SOCIAL WORK PLACEMENT NOTE 03/07/2012  Patient:  [name], [name]  Account Number:  [id number] Admit date:  03/05/2012  Clinical Social Worker:  Unk Lightning, LCSW  Date/time:  03/07/2012 12:45 PM  Clinical Social Work is seeking post-discharge placement for this patient at the following level of care:   SKILLED NURSING   (*CSW will update this form in Epic as items are completed)   03/07/2012  Patient/family provided with [facility] Department of Clinical Social Work's list of facilities offering this level of care within the geographic area requested by the patient (or if unable, by the patient's family).  03/07/2012  Patient/family informed of their freedom to choose among providers that offer the needed level of care, that participate in Medicare, Medicaid or managed care program needed by the patient, have an available bed and are willing to accept the patient.  03/07/2012  Patient/family informed of [facility]' ownership interest in [facility], as well as of the fact that they are under no obligation to receive care at this facility.  PASARR submitted to EDS on 03/07/12 PASARR number received from EDS on 03/08/12  FL2 transmitted to all facilities in geographic area requested by pt/family on  03/07/2012 FL2 transmitted to all facilities within larger geographic area on   Patient informed that his/her managed care company has contracts with or will negotiate with  certain facilities, including the following:     Patient/family informed of bed offers received:  03/08/12 Patient chooses bed at [facility] Physician recommends and patient chooses bed at    Patient to be transferred to  on  03/11/12 Patient to be transferred to facility by [facility]  The following physician request were entered in Epic:   Additional Comments: 03/07/12-CSW unable to submitt Pasarr because website is down

## 2012-03-08 LAB — GLUCOSE, CAPILLARY
Glucose-Capillary: 125 mg/dL — ABNORMAL HIGH (ref 70–99)
Glucose-Capillary: 125 mg/dL — ABNORMAL HIGH (ref 70–99)
Glucose-Capillary: 131 mg/dL — ABNORMAL HIGH (ref 70–99)
Glucose-Capillary: 90 mg/dL (ref 70–99)
Glucose-Capillary: 141 mg/dL — ABNORMAL HIGH (ref 70–99)
Glucose-Capillary: 129 mg/dL — ABNORMAL HIGH (ref 70–99)
Glucose-Capillary: 145 mg/dL — ABNORMAL HIGH (ref 70–99)
Glucose-Capillary: 81 mg/dL (ref 70–99)
Glucose-Capillary: 155 mg/dL — ABNORMAL HIGH (ref 70–99)

## 2012-03-08 LAB — PROTIME-INR
INR: 1.12 (ref 0.00–1.49)
Prothrombin Time: 14.6 seconds (ref 11.6–15.2)

## 2012-03-08 MED ORDER — WARFARIN SODIUM 7.5 MG PO TABS
7.5000 mg | ORAL_TABLET | Freq: Once | ORAL | Status: AC
  Administered 2012-03-08: 7.5 mg via ORAL
  Filled 2012-03-08: qty 1

## 2012-03-08 MED ORDER — DOCUSATE SODIUM 100 MG PO CAPS
100.0000 mg | ORAL_CAPSULE | Freq: Two times a day (BID) | ORAL | Status: DC
  Administered 2012-03-08 – 2012-03-11 (×7): 100 mg via ORAL
  Filled 2012-03-08 (×8): qty 1

## 2012-03-08 MED ORDER — POLYVINYL ALCOHOL 1.4 % OP SOLN
2.0000 [drp] | OPHTHALMIC | Status: DC | PRN
  Administered 2012-03-10: 2 [drp] via OPHTHALMIC
  Filled 2012-03-08: qty 15

## 2012-03-08 MED ORDER — SENNOSIDES-DOCUSATE SODIUM 8.6-50 MG PO TABS: 1.0000 | ORAL_TABLET | Freq: Every evening | ORAL | Status: DC | PRN

## 2012-03-08 NOTE — Progress Notes (Signed)
Clinical Social Work  CSW reviewed chart which stated patient should be ready to dc at the beginning of the week. CSW met with patient and dtr at bedside to give bed offers and CSW informed patient that [facility] had a bed available. Patient and dtr agreeable to this plan. CSW called SNF who verified a bed should be available. CSW will follow up to assist with dc needs.  [place], [place] [phone] (Coverage)

## 2012-03-08 NOTE — Progress Notes (Signed)
Subjective: Pt comfortable.  C/o dry eyes for which she uses artificial tears at home.  Pain well controlled, foot essentially insensate.   Objective: Vital signs in last 24 hours: Temp:  [98.5 F (36.9 C)-98.7 F (37.1 C)] 98.7 F (37.1 C) (05/17 0524) Pulse Rate:  [70-84] 70  (05/17 0524) Resp:  [16-20] 18  (05/17 0524) BP: (110-124)/(51-59) 124/51 mmHg (05/17 0524) SpO2:  [96 %-99 %] 96 % (05/17 0524)  Intake/Output from previous day:   Intake/Output this shift:     [facility] 03/05/12 2045  HGB 10.7*    Basename 03/05/12 2045  WBC 8.1  RBC 3.76*  HCT 33.4*  PLT 199    Basename 03/05/12 2045  NA 136  K 4.3  CL 104  CO2 21  BUN 22  CREATININE 1.16*  GLUCOSE 193*  CALCIUM 9.3    Basename 03/08/12 0521 03/07/12 0505  LABPT -- --  INR 1.12 1.18    Incision: dressing C/D/I Cap refill of toes intact Assessment/Plan: POD 3 ray amputation:  Currently on IV abx NHP early next week Artificial tears at bedside to use prn   Trishia Cuthrell M 03/08/2012, 10:24 AM

## 2012-03-08 NOTE — Progress Notes (Signed)
Physical Therapy Treatment Patient Details Name: [name] MRN: [id number] DOB: 1935/02/03 Today's Date: 03/08/2012 Time: 1011-1029 PT Time Calculation (min): 18 min  PT Assessment / Plan / Recommendation Comments on Treatment Session  Pt making progress with PT goals but slowly (more so with ambulation).  Pt very pleasant & motivated to participate.      Follow Up Recommendations  Skilled nursing facility    Barriers to Discharge        Equipment Recommendations  Defer to next venue    Recommendations for Other Services    Frequency Min 5X/week   Plan Discharge plan remains appropriate    Precautions / Restrictions Precautions Precautions: Fall Required Braces or Orthoses: Other Brace/Splint Other Brace/Splint: Post op show on LLE Restrictions Weight Bearing Restrictions: Yes LLE Weight Bearing: Non weight bearing    Mobility  Bed Mobility Bed Mobility: Supine to Sit;Sitting - Scoot to Edge of Bed Supine to Sit: 6: Modified independent (Device/Increase time);HOB flat Sitting - Scoot to Edge of Bed: 6: Modified independent (Device/Increase time) Transfers Transfers: Sit to Stand;Stand to Sit Sit to Stand: 4: Min guard;With upper extremity assist;With armrests;From bed;From chair/3-in-1 Stand to Sit: 4: Min assist;With upper extremity assist;With armrests;To chair/3-in-1 Details for Transfer Assistance: Cues for LLE positioning, hand placement, & technique.   Ambulation/Gait Ambulation/Gait Assistance: 4: Min assist Ambulation Distance (Feet): 12 Feet (6' + 6') Assistive device: Rolling walker Ambulation/Gait Assistance Details: Minor (A) for balance & RW management.  Cues for RW advancement, & reinforcement of LLE NWBing.   Gait Pattern: Step-to pattern;Trunk flexed Stairs: No Wheelchair Mobility Wheelchair Mobility: No    Exercises General Exercises - Lower Extremity Quad Sets: AAROM Long Arc Quad: AROM;Strengthening;Both;10 reps;Seated Hip Flexion/Marching:  AROM;Strengthening;Both;10 reps;Seated   PT Diagnosis:    PT Problem List:   PT Treatment Interventions:     PT Goals Acute Rehab PT Goals Pt will go Sit to Supine/Side: Independently PT Goal: Sit to Supine/Side - Progress: Progressing toward goal PT Goal: Sit to Stand - Progress: Progressing toward goal PT Goal: Stand to Sit - Progress: Progressing toward goal PT Goal: Ambulate - Progress: Progressing toward goal  Visit Information  Last PT Received On: 03/08/12 Assistance Needed: +1    Subjective Data      Cognition  Overall Cognitive Status: Appears within functional limits for tasks assessed/performed Arousal/Alertness: Awake/alert Orientation Level: Appears intact for tasks assessed Behavior During Session: [facility] for tasks performed    Balance     End of Session PT - End of Session Equipment Utilized During Treatment: Gait belt Activity Tolerance: Patient tolerated treatment well;Patient limited by fatigue;Patient limited by pain Patient left: in chair;with call bell/phone within reach    Lara Mulch 03/08/2012, 1:45 PM   Verdell Face, PTA [phone] 03/08/2012

## 2012-03-08 NOTE — Progress Notes (Signed)
ANTICOAGULATION CONSULT NOTE - Follow Up Consult  Pharmacy Consult for Coumadin Indication: VTE prophylaxis  Allergies  Allergen Reactions  . Adhesive (Tape)     rash  . Cefazolin Hives  . Clorazepate Dipotassium   . Darifenacin Hydrobromide     REACTION: hypotension, near syncope  . Dilaudid (Hydromorphone Hcl)     REACTION: confused, intense itching  . Doxycycline     REACTION: RASH  . Enablex (Darifenacin Hydrobromide Er) Other (See Comments)    Reaction unknown  . Methadone Hcl Other (See Comments)    Reaction unknown  . Pentazocine Lactate     Altered mental state    Patient Measurements:   Heparin Dosing Weight:   Vital Signs: Temp: 98.7 F (37.1 C) (05/17 0524) Temp src: Oral (05/17 0524) BP: 124/51 mmHg (05/17 0524) Pulse Rate: 70  (05/17 0524)  Labs:  [name] 03/08/12 0521 03/07/12 0505 03/06/12 0528 03/05/12 2045  HGB -- -- -- 10.7*  HCT -- -- -- 33.4*  PLT -- -- -- 199  APTT -- -- -- --  LABPROT 14.6 15.3* 16.0* --  INR 1.12 1.18 1.25 --  HEPARINUNFRC -- -- -- --  CREATININE -- -- -- 1.16*  CKTOTAL -- -- -- --  CKMB -- -- -- --  TROPONINI -- -- -- --    The CrCl is unknown because both a height and weight (above a minimum accepted value) are required for this calculation.  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 INR (1.12) is subtherapeutic and decreasing with Coumadin 5mg  doses. Will increase dose and follow-up AM INR. - No CBC since 5/14 - No significant bleeding reportee  Goal of Therapy:  INR 2-3   Plan:  1. Coumadin 7.5mg  po x 1 today 2. Follow-up AM INR 3. Check Vancomycin trough in AM (~0600)  [name] [phone] 03/08/2012,8:26 AM

## 2012-03-09 LAB — GLUCOSE, CAPILLARY
Glucose-Capillary: 120 mg/dL — ABNORMAL HIGH (ref 70–99)
Glucose-Capillary: 119 mg/dL — ABNORMAL HIGH (ref 70–99)
Glucose-Capillary: 151 mg/dL — ABNORMAL HIGH (ref 70–99)
Glucose-Capillary: 132 mg/dL — ABNORMAL HIGH (ref 70–99)

## 2012-03-09 LAB — VANCOMYCIN, TROUGH: Vancomycin Tr: 13 ug/mL (ref 10.0–20.0)

## 2012-03-09 LAB — BASIC METABOLIC PANEL
BUN: 10 mg/dL (ref 6–23)
CO2: 26 mEq/L (ref 19–32)
Calcium: 9.3 mg/dL (ref 8.4–10.5)
Chloride: 104 mEq/L (ref 96–112)
Creatinine, Ser: 0.61 mg/dL (ref 0.50–1.10)
GFR calc Af Amer: >90 mL/min (ref >90)
GFR calc non Af Amer: 87 mL/min — ABNORMAL LOW (ref >90)
Glucose, Bld: 128 mg/dL — ABNORMAL HIGH (ref 70–99)
Potassium: 4 mEq/L (ref 3.5–5.1)
Sodium: 140 mEq/L (ref 135–145)

## 2012-03-09 LAB — PROTIME-INR
INR: 1.17 (ref 0.00–1.49)
Prothrombin Time: 15.1 seconds (ref 11.6–15.2)

## 2012-03-09 MED ORDER — L-METHYLFOLATE-B6-B12 3-35-2 MG PO TABS
1.0000 | ORAL_TABLET | Freq: Two times a day (BID) | ORAL | Status: DC
  Administered 2012-03-09 – 2012-03-11 (×5): 1 via ORAL
  Filled 2012-03-09 (×6): qty 1

## 2012-03-09 MED ORDER — WARFARIN SODIUM 7.5 MG PO TABS
7.5000 mg | ORAL_TABLET | Freq: Once | ORAL | Status: AC
  Administered 2012-03-09: 7.5 mg via ORAL
  Filled 2012-03-09: qty 1

## 2012-03-09 MED ORDER — VANCOMYCIN HCL 1000 MG IV SOLR
750.0000 mg | Freq: Three times a day (TID) | INTRAVENOUS | Status: DC
  Filled 2012-03-09 (×2): qty 750

## 2012-03-09 MED ORDER — VANCOMYCIN HCL IN DEXTROSE 1-5 GM/200ML-% IV SOLN
1000.0000 mg | Freq: Two times a day (BID) | INTRAVENOUS | Status: DC
  Administered 2012-03-09 – 2012-03-11 (×4): 1000 mg via INTRAVENOUS
  Filled 2012-03-09 (×5): qty 200

## 2012-03-09 NOTE — Progress Notes (Signed)
ANTIBIOTIC/ANTICOAGULATION CONSULT NOTE - FOLLOW UP  Pharmacy Consult for Vancomycin, Zosyn, and Coumadin Indication: Toe osteomyelitis/necrotic ulcer; VTE prophylaxis s/p toe amputation  Allergies  Allergen Reactions  . Adhesive (Tape)     rash  . Cefazolin Hives  . Clorazepate Dipotassium   . Darifenacin Hydrobromide     REACTION: hypotension, near syncope  . Dilaudid (Hydromorphone Hcl)     REACTION: confused, intense itching  . Doxycycline     REACTION: RASH  . Enablex (Darifenacin Hydrobromide Er) Other (See Comments)    Reaction unknown  . Methadone Hcl Other (See Comments)    Reaction unknown  . Pentazocine Lactate     Altered mental state    Patient Measurements:  Weight: 87kg  Vital Signs: Temp: 98 F (36.7 C) (05/18 0621) BP: 114/70 mmHg (05/18 0621) Pulse Rate: 68  (05/18 0621) Intake/Output from previous day:   Intake/Output from this shift: Total I/O In: 240 [P.O.:240] Out: -   Labs:  Basename 03/09/12 0602  WBC --  HGB --  PLT --  LABCREA --  CREATININE 0.61   The CrCl is unknown because both a height and weight (above a minimum accepted value) are required for this calculation.  Basename 03/09/12 0602  VANCOTROUGH 13.0  VANCOPEAK --  VANCORANDOM --  GENTTROUGH --  GENTPEAK --  GENTRANDOM --  TOBRATROUGH --  TOBRAPEAK --  TOBRARND --  AMIKACINPEAK --  AMIKACINTROU --  AMIKACIN --     Microbiology: Recent Results (from the past 720 hour(s))  SURGICAL PCR SCREEN     Status: Normal   Collection Time   03/05/12 11:06 PM      Component Value Range Status Comment   MRSA, PCR NEGATIVE  NEGATIVE  Final    Staphylococcus aureus NEGATIVE  NEGATIVE  Final     Anti-infectives     Start     Dose/Rate Route Frequency Ordered Stop   03/09/12 1500   vancomycin (VANCOCIN) 750 mg in sodium chloride 0.9 % 150 mL IVPB        750 mg 150 mL/hr over 60 Minutes Intravenous Every 8 hours 03/09/12 0742     03/06/12 1900   vancomycin (VANCOCIN) 750  mg in sodium chloride 0.9 % 150 mL IVPB  Status:  Discontinued        750 mg 150 mL/hr over 60 Minutes Intravenous Every 12 hours 03/06/12 1425 03/09/12 0742   03/05/12 2000   piperacillin-tazobactam (ZOSYN) IVPB 3.375 g  Status:  Discontinued        3.375 g 12.5 mL/hr over 240 Minutes Intravenous 4 times per day 03/05/12 1819 03/05/12 1833   03/05/12 2000  piperacillin-tazobactam (ZOSYN) IVPB 3.375 g       3.375 g 12.5 mL/hr over 240 Minutes Intravenous Every 8 hours 03/05/12 1833     03/05/12 1900   vancomycin (VANCOCIN) IVPB 1000 mg/200 mL premix  Status:  Discontinued        1,000 mg 200 mL/hr over 60 Minutes Intravenous Every 12 hours 03/05/12 1819 03/06/12 1425          Assessment: 1. 76yof continuing Vancomycin and Zosyn Day 4 for toe osteomyelitis and necrotic ulcer s/p toe amputation (5/15). Patient has remained afebrile and renal function has improved since admission (SCr 1.16-->0.61) with CrCl now ~ 96ml/min. Vancomycin trough drawn this AM was 13 - just below goal level.   2. 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 INR (1.17) remains subtherapeutic but  trended up some with Coumadin 7.5mg  dose. Will plan to repeat 7.5mg  dose but if similar response tomorrow will increase dose to Coumadin 10mg . - No CBC this AM - No significant bleeding reported   Goal of Therapy:  Vancomycin trough level 15-20 mcg/ml INR 2-3  Plan:  1. Increase Vancomycin to 1g IV q12h - next dose @ 1500 2. Continue Zosyn 3.375g IV q8h 3. Coumadin 7.5mg  po x 1 today 4. Continue to monitor renal function, vitals, AM INR and order follow-up Vancomycin trough at steady state if continued  [name] [phone] 03/09/2012,12:03 PM

## 2012-03-09 NOTE — Progress Notes (Signed)
Patient ID: [name], female   DOB: 02-09-1935, 77 y.o.   MRN: [id number] No acute changes. Doing well.  [facility].  Wishes to start back on home dose of Vit B6.  Will order this.  Dressing left foot clean/dry/intact.

## 2012-03-09 NOTE — Progress Notes (Signed)
Physical Therapy Treatment Patient Details Name: [name] MRN: [id number] DOB: 03-20-1935 Today's Date: 03/09/2012 Time: 4010-2725 PT Time Calculation (min): 20 min  PT Assessment / Plan / Recommendation Comments on Treatment Session  Pt continues to progress slowly.    Follow Up Recommendations  Skilled nursing facility    Barriers to Discharge        Equipment Recommendations  Defer to next venue    Recommendations for Other Services    Frequency Min 5X/week   Plan Discharge plan remains appropriate;Frequency remains appropriate    Precautions / Restrictions Precautions Precautions: Fall Required Braces or Orthoses: Other Brace/Splint Other Brace/Splint: post op shoe Restrictions LLE Weight Bearing: Non weight bearing        Mobility  Bed Mobility Supine to Sit: 6: Modified independent (Device/Increase time);HOB flat Sitting - Scoot to Edge of Bed: 6: Modified independent (Device/Increase time) Transfers Sit to Stand: 4: Min guard;From bed;With upper extremity assist Stand to Sit: 4: Min guard;With armrests;To chair/3-in-1 Details for Transfer Assistance: cues for safety/sequencing Ambulation/Gait Ambulation/Gait Assistance: 4: Min assist Ambulation Distance (Feet): 12 Feet Assistive device: Rolling walker Gait Pattern: Step-to pattern;Trunk flexed Gait velocity: decreased    Exercises     PT Diagnosis:    PT Problem List:   PT Treatment Interventions:     PT Goals Acute Rehab PT Goals PT Goal: Sit to Supine/Side - Progress: Met PT Goal: Sit to Stand - Progress: Progressing toward goal PT Goal: Stand to Sit - Progress: Progressing toward goal PT Goal: Ambulate - Progress: Progressing toward goal  Visit Information  Last PT Received On: 03/09/12 Assistance Needed: +1    Subjective Data  Subjective: I am making it. Patient Stated Goal: walk normal   Cognition  Overall Cognitive Status: Appears within functional limits for tasks  assessed/performed Arousal/Alertness: Awake/alert Orientation Level: Appears intact for tasks assessed Behavior During Session: [facility] for tasks performed    Balance     End of Session PT - End of Session Equipment Utilized During Treatment: Gait belt Activity Tolerance: Patient tolerated treatment well Patient left: in chair;with call bell/phone within reach    Ilda Foil 03/09/2012, 12:51 PM  Aida Raider, PT  Office # [phone] Pager [phone]

## 2012-03-10 ENCOUNTER — Other Ambulatory Visit: Payer: Self-pay | Admitting: Internal Medicine

## 2012-03-10 LAB — PROTIME-INR
INR: 1.27 (ref 0.00–1.49)
Prothrombin Time: 16.2 seconds — ABNORMAL HIGH (ref 11.6–15.2)

## 2012-03-10 LAB — GLUCOSE, CAPILLARY
Glucose-Capillary: 139 mg/dL — ABNORMAL HIGH (ref 70–99)
Glucose-Capillary: 159 mg/dL — ABNORMAL HIGH (ref 70–99)
Glucose-Capillary: 156 mg/dL — ABNORMAL HIGH (ref 70–99)
Glucose-Capillary: 146 mg/dL — ABNORMAL HIGH (ref 70–99)

## 2012-03-10 MED ORDER — WARFARIN SODIUM 10 MG PO TABS
10.0000 mg | ORAL_TABLET | Freq: Once | ORAL | Status: AC
  Administered 2012-03-10: 10 mg via ORAL
  Filled 2012-03-10: qty 1

## 2012-03-10 NOTE — Progress Notes (Signed)
Patient ID: [name], female   DOB: [birth year], 77 y.o.   MRN: [id number] Comfortable.  No complaints.  Dressing clean and dry.  To SNF early this week.

## 2012-03-10 NOTE — Progress Notes (Signed)
ANTICOAGULATION CONSULT NOTE - Follow Up Consult  Pharmacy Consult for Coumadin Indication: VTE prophylaxis  Allergies  Allergen Reactions  . Adhesive (Tape)     rash  . Cefazolin Hives  . Clorazepate Dipotassium   . Darifenacin Hydrobromide     REACTION: hypotension, near syncope  . Dilaudid (Hydromorphone Hcl)     REACTION: confused, intense itching  . Doxycycline     REACTION: RASH  . Enablex (Darifenacin Hydrobromide Er) Other (See Comments)    Reaction unknown  . Methadone Hcl Other (See Comments)    Reaction unknown  . Pentazocine Lactate     Altered mental state    Patient Measurements: Height: 5\' 5"  (165.1 cm) Weight: 191 lb (86.637 kg) IBW/kg (Calculated) : 57  Heparin Dosing Weight:   Vital Signs: Temp: 98.1 F (36.7 C) (05/19 0530) Temp src: Oral (05/19 0530) BP: 133/84 mmHg (05/19 0530) Pulse Rate: 66  (05/19 0530)  Labs:  Basename 03/10/12 0520 03/09/12 0602 03/08/12 0521  HGB -- -- --  HCT -- -- --  PLT -- -- --  APTT -- -- --  LABPROT 16.2* 15.1 14.6  INR 1.27 1.17 1.12  HEPARINUNFRC -- -- --  CREATININE -- 0.61 --  CKTOTAL -- -- --  CKMB -- -- --  TROPONINI -- -- --    Estimated Creatinine Clearance: 67 ml/min (by C-G formula based on Cr of 0.61).  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 INR (1.27) is subtherapeutic and is not trending up as much as desired with Coumadin 7.5mg  doses. Will increase Coumadin to 10mg  tonight to get INR moving and follow-up AM INR. - No CBC this AM - No bleeding reported  Goal of Therapy:  INR 2-3   Plan:  1. Coumadin 10mg  po x 1 today 2. Follow-up AM INR  [name] [phone] 03/10/2012,1:53 PM

## 2012-03-11 DIAGNOSIS — Z4789 Encounter for other orthopedic aftercare: Secondary | ICD-10-CM | POA: Diagnosis not present

## 2012-03-11 DIAGNOSIS — M86669 Other chronic osteomyelitis, unspecified tibia and fibula: Secondary | ICD-10-CM | POA: Diagnosis not present

## 2012-03-11 DIAGNOSIS — D649 Anemia, unspecified: Secondary | ICD-10-CM | POA: Diagnosis not present

## 2012-03-11 DIAGNOSIS — S98139A Complete traumatic amputation of one unspecified lesser toe, initial encounter: Secondary | ICD-10-CM | POA: Diagnosis not present

## 2012-03-11 DIAGNOSIS — R627 Adult failure to thrive: Secondary | ICD-10-CM | POA: Diagnosis not present

## 2012-03-11 DIAGNOSIS — L0390 Cellulitis, unspecified: Secondary | ICD-10-CM | POA: Diagnosis not present

## 2012-03-11 DIAGNOSIS — Z5189 Encounter for other specified aftercare: Secondary | ICD-10-CM | POA: Diagnosis not present

## 2012-03-11 DIAGNOSIS — M1990 Unspecified osteoarthritis, unspecified site: Secondary | ICD-10-CM | POA: Diagnosis not present

## 2012-03-11 DIAGNOSIS — R0602 Shortness of breath: Secondary | ICD-10-CM | POA: Diagnosis not present

## 2012-03-11 DIAGNOSIS — M4800 Spinal stenosis, site unspecified: Secondary | ICD-10-CM | POA: Diagnosis not present

## 2012-03-11 DIAGNOSIS — M869 Osteomyelitis, unspecified: Secondary | ICD-10-CM | POA: Diagnosis not present

## 2012-03-11 DIAGNOSIS — E039 Hypothyroidism, unspecified: Secondary | ICD-10-CM | POA: Diagnosis not present

## 2012-03-11 DIAGNOSIS — R279 Unspecified lack of coordination: Secondary | ICD-10-CM | POA: Diagnosis not present

## 2012-03-11 DIAGNOSIS — L97509 Non-pressure chronic ulcer of other part of unspecified foot with unspecified severity: Secondary | ICD-10-CM | POA: Diagnosis not present

## 2012-03-11 DIAGNOSIS — L0291 Cutaneous abscess, unspecified: Secondary | ICD-10-CM | POA: Diagnosis not present

## 2012-03-11 DIAGNOSIS — M810 Age-related osteoporosis without current pathological fracture: Secondary | ICD-10-CM | POA: Diagnosis not present

## 2012-03-11 DIAGNOSIS — I10 Essential (primary) hypertension: Secondary | ICD-10-CM | POA: Diagnosis not present

## 2012-03-11 DIAGNOSIS — M79609 Pain in unspecified limb: Secondary | ICD-10-CM | POA: Diagnosis not present

## 2012-03-11 DIAGNOSIS — E785 Hyperlipidemia, unspecified: Secondary | ICD-10-CM | POA: Diagnosis not present

## 2012-03-11 DIAGNOSIS — Z79899 Other long term (current) drug therapy: Secondary | ICD-10-CM | POA: Diagnosis not present

## 2012-03-11 DIAGNOSIS — L97409 Non-pressure chronic ulcer of unspecified heel and midfoot with unspecified severity: Secondary | ICD-10-CM | POA: Diagnosis not present

## 2012-03-11 DIAGNOSIS — E559 Vitamin D deficiency, unspecified: Secondary | ICD-10-CM | POA: Diagnosis not present

## 2012-03-11 DIAGNOSIS — R269 Unspecified abnormalities of gait and mobility: Secondary | ICD-10-CM | POA: Diagnosis not present

## 2012-03-11 DIAGNOSIS — R7611 Nonspecific reaction to tuberculin skin test without active tuberculosis: Secondary | ICD-10-CM | POA: Diagnosis not present

## 2012-03-11 DIAGNOSIS — E1169 Type 2 diabetes mellitus with other specified complication: Secondary | ICD-10-CM | POA: Diagnosis not present

## 2012-03-11 LAB — PROTIME-INR
INR: 1.58 — ABNORMAL HIGH (ref 0.00–1.49)
Prothrombin Time: 19.2 seconds — ABNORMAL HIGH (ref 11.6–15.2)

## 2012-03-11 LAB — GLUCOSE, CAPILLARY: Glucose-Capillary: 137 mg/dL — ABNORMAL HIGH (ref 70–99)

## 2012-03-11 MED ORDER — WARFARIN SODIUM 7.5 MG PO TABS
7.5000 mg | ORAL_TABLET | Freq: Once | ORAL | Status: DC
  Filled 2012-03-11: qty 1

## 2012-03-11 MED ORDER — HYDROCODONE-ACETAMINOPHEN 10-660 MG PO TABS: 1.0000 | ORAL_TABLET | ORAL | Status: DC | PRN

## 2012-03-11 NOTE — Discharge Instructions (Signed)
Change dressing left foot when necessary. Nonweightbearing left foot.

## 2012-03-11 NOTE — Progress Notes (Signed)
Clinical Social Work  CSW faxed dc summary to SNF [employer] [facility]) who stated they received paperwork and patient could admit after 1300. CSW informed patient and RN of dc and both were agreeable. Patient reported she would call family and update them on dc plans. CSW prepared dc packet and coordinated transportation via [employer]. CSW is signing off.  [place], [place] [phone] (Coverage for Lovette Cliche)

## 2012-03-11 NOTE — Progress Notes (Signed)
Occupational Therapy Treatment Patient Details Name: [name] MRN: [id number] DOB: 1935-08-20 Today's Date: 03/11/2012 Time: 4098-1191 OT Time Calculation (min): 23 min  OT Assessment / Plan / Recommendation Comments on Treatment Session PT making good progress. Discussed D/c and POC.    Follow Up Recommendations  Skilled nursing facility    Barriers to Discharge       Equipment Recommendations  Defer to next venue    Recommendations for Other Services    Frequency Min 2X/week   Plan Discharge plan remains appropriate    Precautions / Restrictions Precautions Precautions: Fall Required Braces or Orthoses: Other Brace/Splint Other Brace/Splint: post op shoe Restrictions Weight Bearing Restrictions: Yes LLE Weight Bearing: Non weight bearing   Pertinent Vitals/Pain No c/o    ADL  ADL Comments: Focus on compensatory techniques to avail to amintain NWB status during ADL. Also taught pt UB strengthening ex to incrase strength needed for RW mobility. Pt demonstrted indiep.    OT Goals Acute Rehab OT Goals OT Goal Formulation: With patient Time For Goal Achievement: 03/21/12 Potential to Achieve Goals: Good ADL Goals Pt Will Perform Grooming: with supervision;Standing at sink ADL Goal: Grooming - Progress: Progressing toward goals Pt Will Perform Lower Body Bathing: with supervision;Sit to stand from chair;Sit to stand from bed ADL Goal: Lower Body Bathing - Progress: Progressing toward goals Pt Will Perform Lower Body Dressing: with supervision;Sit to stand from bed;Sit to stand from chair ADL Goal: Lower Body Dressing - Progress: Progressing toward goals Pt Will Transfer to Toilet: with supervision;Maintaining weight bearing status;3-in-1;Ambulation;Comfort height toilet ADL Goal: Toilet Transfer - Progress: Progressing toward goals Pt Will Perform Toileting - Clothing Manipulation: with supervision;Sitting on 3-in-1 or toilet;Standing ADL Goal: Toileting - Clothing  Manipulation - Progress: Progressing toward goals Pt Will Perform Toileting - Hygiene: with supervision;Sit to stand from 3-in-1/toilet ADL Goal: Toileting - Hygiene - Progress: Progressing toward goals  Visit Information  Last OT Received On: 03/11/12    Subjective Data   I think I'm leaving today   Prior Functioning       Cognition  Overall Cognitive Status: Appears within functional limits for tasks assessed/performed         Exercises  theraband ex  Balance    End of Session OT - End of Session Activity Tolerance: Patient tolerated treatment well Patient left: in chair;with call bell/phone within reach   [facility] 03/11/2012, 11:28 AMHilary Rockne Dearinger, OTR/L  [phone] 03/11/2012

## 2012-03-11 NOTE — Progress Notes (Signed)
Physical Therapy Treatment Note   03/11/12 1051  PT Visit Information  Last PT Received On 03/11/12  Assistance Needed +1  PT Time Calculation  PT Start Time 1051  PT Stop Time 1109  PT Time Calculation (min) 18 min  Subjective Data  Subjective Pt received sitting up in bed with report "I'm going to rehab today."  Precautions  Precautions Fall  Required Braces or Orthoses Other Brace/Splint  Other Brace/Splint post op shoe  Restrictions  LLE Weight Bearing NWB  Cognition  Overall Cognitive Status Appears within functional limits for tasks assessed/performed  Arousal/Alertness Awake/alert  Orientation Level Appears intact for tasks assessed  Behavior During Session [facility] for tasks performed  Transfers  Transfers Sit to Stand;Stand to Sit  Sit to Stand 4: Min guard;From bed;With upper extremity assist  Stand to Sit 4: Min guard;With armrests;To chair/3-in-1  Details for Transfer Assistance pt demo'd good technique  Ambulation/Gait  Ambulation/Gait Assistance 4: Min guard  Ambulation Distance (Feet) 12 Feet  Assistive device Rolling walker  Ambulation/Gait Assistance Details pt limited by fatigue. pt requires increased effort to clear R foot due to L LE NWB. Patient with generalized deconditioning.  Gait Pattern Step-to pattern  Gait velocity slow  Exercises  Exercises (handout provided)  General Exercises - Lower Extremity  Ankle Circles/Pumps AROM;Right;10 reps;Seated  Quad Sets AROM;Both;10 reps;Seated (with LEs elevated)  Gluteal Sets AROM;10 reps;Seated;Strengthening  Long [employer] Both;AROM;10 reps;Seated  Hip ABduction/ADduction AROM;Left;10 reps;Seated  Straight Leg Raises Left;10 reps;Seated;AROM  PT - End of Session  Equipment Utilized During Treatment Gait belt  Activity Tolerance Patient limited by fatigue  Patient left in chair;with call bell/phone within reach  Nurse Communication Mobility status  PT - Assessment/Plan  Comments on Treatment Session Pt  progress towards all goals however remains to have decreased ambulation tolerance due to generalize deconditioning. Patient to con't to benefit form SNF upon d/c to achieve maximal functional recovery.  PT Plan Discharge plan remains appropriate;Frequency remains appropriate  PT Frequency Min 5X/week  Follow Up Recommendations Skilled nursing facility  Equipment Recommended Defer to next venue  Acute Rehab PT Goals  PT Goal: Sit to Stand - Progress Progressing toward goal  PT Goal: Stand to Sit - Progress Progressing toward goal  PT Goal: Ambulate - Progress Progressing toward goal  Additional Goals  Additional Goal #1 Pt I with HEP.  PT Goal: Additional Goal #1 - Progress Progressing toward goal    Pain: pt reports L ankle pain to be tolerable but unable to rate.  Lewis Shock, PT, DPT Pager #: [phone] Office #: [phone]

## 2012-03-11 NOTE — Progress Notes (Addendum)
ANTIBIOTIC/ANTICOAGULATION CONSULT NOTE - FOLLOW UP  Pharmacy Consult for: Vanc and Zosyn for Toe osteomyelitis / necrotic ulcer  Coumadin VTE prophylaxis s/p toe amputation  Patient Measurements: Height: 5\' 5"  (165.1 cm) Weight: 191 lb (86.637 kg) IBW/kg (Calculated) : 57 Weight: 86kg  Vital Signs: Temp: 98.1 F (36.7 C) (05/20 1610) Temp src: Oral (05/20 9604) BP: 103/63 mmHg (05/20 5409) Pulse Rate: 69  (05/20 0632) Intake/Output from previous day: 05/19 0701 - 05/20 0700 In: 1370 [P.O.:1200; I.V.:120; IV Piggyback:50] Out: -   Labs:  Basename 03/09/12 0602  WBC --  HGB --  PLT --  LABCREA --  CREATININE 0.61   Estimated Creatinine Clearance: 67 ml/min (by C-G formula based on Cr of 0.61).  Basename 03/09/12 0602  VANCOTROUGH 13.0  VANCOPEAK --  VANCORANDOM --  GENTTROUGH --  GENTPEAK --  GENTRANDOM --  TOBRATROUGH --  TOBRAPEAK --  TOBRARND --  AMIKACINPEAK --  AMIKACINTROU --  AMIKACIN --     Microbiology: Recent Results (from the past 720 hour(s))  SURGICAL PCR SCREEN     Status: Normal   Collection Time   03/05/12 11:06 PM      Component Value Range Status Comment   MRSA, PCR NEGATIVE  NEGATIVE  Final    Staphylococcus aureus NEGATIVE  NEGATIVE  Final     Anti-infectives     Start     Dose/Rate Route Frequency Ordered Stop   03/09/12 1500   vancomycin (VANCOCIN) 750 mg in sodium chloride 0.9 % 150 mL IVPB  Status:  Discontinued        750 mg 150 mL/hr over 60 Minutes Intravenous Every 8 hours 03/09/12 0742 03/09/12 1216   03/09/12 1400   vancomycin (VANCOCIN) IVPB 1000 mg/200 mL premix        1,000 mg 200 mL/hr over 60 Minutes Intravenous Every 12 hours 03/09/12 1216     03/06/12 1900   vancomycin (VANCOCIN) 750 mg in sodium chloride 0.9 % 150 mL IVPB  Status:  Discontinued        750 mg 150 mL/hr over 60 Minutes Intravenous Every 12 hours 03/06/12 1425 03/09/12 0742   03/05/12 2000   piperacillin-tazobactam (ZOSYN) IVPB 3.375 g  Status:   Discontinued        3.375 g 12.5 mL/hr over 240 Minutes Intravenous 4 times per day 03/05/12 1819 03/05/12 1833   03/05/12 2000  piperacillin-tazobactam (ZOSYN) IVPB 3.375 g       3.375 g 12.5 mL/hr over 240 Minutes Intravenous Every 8 hours 03/05/12 1833     03/05/12 1900   vancomycin (VANCOCIN) IVPB 1000 mg/200 mL premix  Status:  Discontinued        1,000 mg 200 mL/hr over 60 Minutes Intravenous Every 12 hours 03/05/12 1819 03/06/12 1425          Assessment: 1. Toe osteo/necrotic ulcer s/p toe amputation (5/15) - Vancomycin and Zosyn Day 6  . Patient has remained afebrile and renal function appears to be at baseline (SCr 1.16-->0.61) with CrCl now ~ 11ml/min. Vancomycin trough drawn 03/09/12 was 13 - just below goal level. Patient for d/c to SNF today. No follow up vancomycin level indicated.  Plan: Likely d/c antibiotics at d/c, no further monitoring warranted.  2. 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 INR (1.27>>1.58) remains subtherapeutic but trended up, no cbc ordered for this am. Plan: Patient is either a slow responder or has very high warfarin requirements, would likely  continue warfarin 7.5mg  daily at discharge with close INR follow up later this week if aim for patient is to have a therapeutic INR.   Goal of Therapy:  Vancomycin trough level 15-20 mcg/ml INR 2-3   Severiano Gilbert [phone] 03/11/2012,8:15 AM

## 2012-03-11 NOTE — Discharge Summary (Signed)
Physician Discharge Summary  Patient ID: [name] MRN: [id number] DOB/AGE: Dec 05, 1934 77 y.o.  Admit date: 03/05/2012 Discharge date: 03/11/2012  Admission Diagnoses: Osteomyelitis abscess left foot fifth metatarsal.  Discharge Diagnoses: Same Principal Problem:  *Osteomyelitis of foot, acute, left   Discharged Condition: stable  Hospital Course: Patient's hospital course was essentially unremarkable she underwent a left foot fifth ray amputation. Postoperatively patient progressed well she was then able to be independent with discharge to home and patient was set up for discharge to short-term skilled nursing.  Consults: None  Significant Diagnostic Studies: labs: Routine labs  Treatments: surgery: Please see operative note  Discharge Exam: Blood pressure 132/93, pulse 78, temperature 98.8 F (37.1 C), temperature source Oral, resp. rate 16, height 5\' 5"  (1.651 m), weight 86.637 kg (191 lb), SpO2 98.00%. Incision/Wound: patient's incision was clean and dry at time of discharge  Disposition:    Medication List  As of 03/11/2012  6:23 AM   ASK your doctor about these medications         amLODipine-benazepril 5-10 MG per capsule   Commonly known as: LOTREL   Take 1 capsule by mouth daily.      diclofenac sodium 1 % Gel   Commonly known as: VOLTAREN   Apply 1 application topically daily as needed. For swelling in ankle      Hydrocodone-Acetaminophen 10-660 MG Tabs   Take 1 tablet by mouth every 4 (four) hours as needed. For nausea      ICAPS AREDS FORMULA PO   Take 1 capsule by mouth 2 (two) times daily.      LUTEIN PO   Take 1 capsule by mouth every evening.      meclizine 25 MG tablet   Commonly known as: ANTIVERT   Take 25 mg by mouth daily as needed. For dizziness      nabumetone 750 MG tablet   Commonly known as: RELAFEN   Take 750 mg by mouth 2 (two) times daily.      omeprazole 20 MG capsule   Commonly known as: PRILOSEC   Take 20 mg by mouth daily.       OVER THE COUNTER MEDICATION   Take 1 tablet by mouth daily as needed. For allergies otc antihistamine-decongestant      VITACIRC-B PO   Take 1 tablet by mouth 2 (two) times daily.      VITAMIN D PO   Take 5,000 Units by mouth daily.           Follow-up Information    Follow up with Latanya Hemmer V, MD in 2 weeks.   Contact information:   [address] [place] [address] [phone]          Signed: Nadara Mustard 03/11/2012, 6:23 AM

## 2012-03-11 NOTE — Telephone Encounter (Signed)
Refill done.  

## 2012-03-12 DIAGNOSIS — E1169 Type 2 diabetes mellitus with other specified complication: Secondary | ICD-10-CM | POA: Diagnosis not present

## 2012-03-12 DIAGNOSIS — L0291 Cutaneous abscess, unspecified: Secondary | ICD-10-CM | POA: Diagnosis not present

## 2012-03-12 DIAGNOSIS — R627 Adult failure to thrive: Secondary | ICD-10-CM | POA: Diagnosis not present

## 2012-03-12 DIAGNOSIS — M869 Osteomyelitis, unspecified: Secondary | ICD-10-CM | POA: Diagnosis not present

## 2012-03-12 DIAGNOSIS — L97509 Non-pressure chronic ulcer of other part of unspecified foot with unspecified severity: Secondary | ICD-10-CM | POA: Diagnosis not present

## 2012-03-12 DIAGNOSIS — M1990 Unspecified osteoarthritis, unspecified site: Secondary | ICD-10-CM | POA: Diagnosis not present

## 2012-03-12 DIAGNOSIS — I10 Essential (primary) hypertension: Secondary | ICD-10-CM | POA: Diagnosis not present

## 2012-03-12 LAB — GLUCOSE, CAPILLARY: Glucose-Capillary: 98 mg/dL (ref 70–99)

## 2012-04-05 DIAGNOSIS — Z4789 Encounter for other orthopedic aftercare: Secondary | ICD-10-CM | POA: Diagnosis not present

## 2012-04-05 DIAGNOSIS — L97409 Non-pressure chronic ulcer of unspecified heel and midfoot with unspecified severity: Secondary | ICD-10-CM | POA: Diagnosis not present

## 2012-04-11 DIAGNOSIS — M1990 Unspecified osteoarthritis, unspecified site: Secondary | ICD-10-CM | POA: Diagnosis not present

## 2012-04-11 DIAGNOSIS — M4800 Spinal stenosis, site unspecified: Secondary | ICD-10-CM | POA: Diagnosis not present

## 2012-04-11 DIAGNOSIS — M86669 Other chronic osteomyelitis, unspecified tibia and fibula: Secondary | ICD-10-CM | POA: Diagnosis not present

## 2012-04-11 DIAGNOSIS — E1159 Type 2 diabetes mellitus with other circulatory complications: Secondary | ICD-10-CM | POA: Diagnosis not present

## 2012-04-11 DIAGNOSIS — R269 Unspecified abnormalities of gait and mobility: Secondary | ICD-10-CM | POA: Diagnosis not present

## 2012-04-11 DIAGNOSIS — N393 Stress incontinence (female) (male): Secondary | ICD-10-CM | POA: Diagnosis not present

## 2012-04-11 DIAGNOSIS — L97509 Non-pressure chronic ulcer of other part of unspecified foot with unspecified severity: Secondary | ICD-10-CM | POA: Diagnosis not present

## 2012-04-12 DIAGNOSIS — L97509 Non-pressure chronic ulcer of other part of unspecified foot with unspecified severity: Secondary | ICD-10-CM | POA: Diagnosis not present

## 2012-04-12 DIAGNOSIS — E1159 Type 2 diabetes mellitus with other circulatory complications: Secondary | ICD-10-CM | POA: Diagnosis not present

## 2012-04-12 DIAGNOSIS — M4800 Spinal stenosis, site unspecified: Secondary | ICD-10-CM | POA: Diagnosis not present

## 2012-04-12 DIAGNOSIS — M1990 Unspecified osteoarthritis, unspecified site: Secondary | ICD-10-CM | POA: Diagnosis not present

## 2012-04-12 DIAGNOSIS — R269 Unspecified abnormalities of gait and mobility: Secondary | ICD-10-CM | POA: Diagnosis not present

## 2012-04-12 DIAGNOSIS — M86669 Other chronic osteomyelitis, unspecified tibia and fibula: Secondary | ICD-10-CM | POA: Diagnosis not present

## 2012-04-15 DIAGNOSIS — R269 Unspecified abnormalities of gait and mobility: Secondary | ICD-10-CM | POA: Diagnosis not present

## 2012-04-15 DIAGNOSIS — E1159 Type 2 diabetes mellitus with other circulatory complications: Secondary | ICD-10-CM | POA: Diagnosis not present

## 2012-04-15 DIAGNOSIS — M4800 Spinal stenosis, site unspecified: Secondary | ICD-10-CM | POA: Diagnosis not present

## 2012-04-15 DIAGNOSIS — M86669 Other chronic osteomyelitis, unspecified tibia and fibula: Secondary | ICD-10-CM | POA: Diagnosis not present

## 2012-04-15 DIAGNOSIS — L97509 Non-pressure chronic ulcer of other part of unspecified foot with unspecified severity: Secondary | ICD-10-CM | POA: Diagnosis not present

## 2012-04-15 DIAGNOSIS — M1990 Unspecified osteoarthritis, unspecified site: Secondary | ICD-10-CM | POA: Diagnosis not present

## 2012-04-16 DIAGNOSIS — M4800 Spinal stenosis, site unspecified: Secondary | ICD-10-CM | POA: Diagnosis not present

## 2012-04-16 DIAGNOSIS — M1990 Unspecified osteoarthritis, unspecified site: Secondary | ICD-10-CM | POA: Diagnosis not present

## 2012-04-16 DIAGNOSIS — M86669 Other chronic osteomyelitis, unspecified tibia and fibula: Secondary | ICD-10-CM | POA: Diagnosis not present

## 2012-04-16 DIAGNOSIS — E1159 Type 2 diabetes mellitus with other circulatory complications: Secondary | ICD-10-CM | POA: Diagnosis not present

## 2012-04-16 DIAGNOSIS — R269 Unspecified abnormalities of gait and mobility: Secondary | ICD-10-CM | POA: Diagnosis not present

## 2012-04-16 DIAGNOSIS — L97509 Non-pressure chronic ulcer of other part of unspecified foot with unspecified severity: Secondary | ICD-10-CM | POA: Diagnosis not present

## 2012-04-17 DIAGNOSIS — M86669 Other chronic osteomyelitis, unspecified tibia and fibula: Secondary | ICD-10-CM | POA: Diagnosis not present

## 2012-04-17 DIAGNOSIS — M4800 Spinal stenosis, site unspecified: Secondary | ICD-10-CM | POA: Diagnosis not present

## 2012-04-17 DIAGNOSIS — M1990 Unspecified osteoarthritis, unspecified site: Secondary | ICD-10-CM | POA: Diagnosis not present

## 2012-04-17 DIAGNOSIS — E1159 Type 2 diabetes mellitus with other circulatory complications: Secondary | ICD-10-CM | POA: Diagnosis not present

## 2012-04-17 DIAGNOSIS — R269 Unspecified abnormalities of gait and mobility: Secondary | ICD-10-CM | POA: Diagnosis not present

## 2012-04-17 DIAGNOSIS — L97509 Non-pressure chronic ulcer of other part of unspecified foot with unspecified severity: Secondary | ICD-10-CM | POA: Diagnosis not present

## 2012-04-18 DIAGNOSIS — R269 Unspecified abnormalities of gait and mobility: Secondary | ICD-10-CM | POA: Diagnosis not present

## 2012-04-18 DIAGNOSIS — M86669 Other chronic osteomyelitis, unspecified tibia and fibula: Secondary | ICD-10-CM | POA: Diagnosis not present

## 2012-04-18 DIAGNOSIS — M1990 Unspecified osteoarthritis, unspecified site: Secondary | ICD-10-CM | POA: Diagnosis not present

## 2012-04-18 DIAGNOSIS — E1159 Type 2 diabetes mellitus with other circulatory complications: Secondary | ICD-10-CM | POA: Diagnosis not present

## 2012-04-18 DIAGNOSIS — L97509 Non-pressure chronic ulcer of other part of unspecified foot with unspecified severity: Secondary | ICD-10-CM | POA: Diagnosis not present

## 2012-04-18 DIAGNOSIS — M4800 Spinal stenosis, site unspecified: Secondary | ICD-10-CM | POA: Diagnosis not present

## 2012-04-22 ENCOUNTER — Other Ambulatory Visit: Payer: Self-pay | Admitting: Internal Medicine

## 2012-04-22 DIAGNOSIS — E1159 Type 2 diabetes mellitus with other circulatory complications: Secondary | ICD-10-CM | POA: Diagnosis not present

## 2012-04-22 DIAGNOSIS — L97509 Non-pressure chronic ulcer of other part of unspecified foot with unspecified severity: Secondary | ICD-10-CM | POA: Diagnosis not present

## 2012-04-22 DIAGNOSIS — R269 Unspecified abnormalities of gait and mobility: Secondary | ICD-10-CM | POA: Diagnosis not present

## 2012-04-22 DIAGNOSIS — M1990 Unspecified osteoarthritis, unspecified site: Secondary | ICD-10-CM | POA: Diagnosis not present

## 2012-04-22 DIAGNOSIS — M4800 Spinal stenosis, site unspecified: Secondary | ICD-10-CM | POA: Diagnosis not present

## 2012-04-22 DIAGNOSIS — M86669 Other chronic osteomyelitis, unspecified tibia and fibula: Secondary | ICD-10-CM | POA: Diagnosis not present

## 2012-04-22 NOTE — Telephone Encounter (Signed)
Ok to refill 

## 2012-04-22 NOTE — Telephone Encounter (Signed)
Refill done.  

## 2012-04-22 NOTE — Telephone Encounter (Signed)
60, 1 RF 

## 2012-04-23 DIAGNOSIS — M4800 Spinal stenosis, site unspecified: Secondary | ICD-10-CM | POA: Diagnosis not present

## 2012-04-23 DIAGNOSIS — M86669 Other chronic osteomyelitis, unspecified tibia and fibula: Secondary | ICD-10-CM | POA: Diagnosis not present

## 2012-04-23 DIAGNOSIS — E1159 Type 2 diabetes mellitus with other circulatory complications: Secondary | ICD-10-CM | POA: Diagnosis not present

## 2012-04-23 DIAGNOSIS — R269 Unspecified abnormalities of gait and mobility: Secondary | ICD-10-CM | POA: Diagnosis not present

## 2012-04-23 DIAGNOSIS — L97509 Non-pressure chronic ulcer of other part of unspecified foot with unspecified severity: Secondary | ICD-10-CM | POA: Diagnosis not present

## 2012-04-23 DIAGNOSIS — M1990 Unspecified osteoarthritis, unspecified site: Secondary | ICD-10-CM | POA: Diagnosis not present

## 2012-04-24 DIAGNOSIS — M4800 Spinal stenosis, site unspecified: Secondary | ICD-10-CM | POA: Diagnosis not present

## 2012-04-24 DIAGNOSIS — E1159 Type 2 diabetes mellitus with other circulatory complications: Secondary | ICD-10-CM | POA: Diagnosis not present

## 2012-04-24 DIAGNOSIS — L97509 Non-pressure chronic ulcer of other part of unspecified foot with unspecified severity: Secondary | ICD-10-CM | POA: Diagnosis not present

## 2012-04-24 DIAGNOSIS — M86669 Other chronic osteomyelitis, unspecified tibia and fibula: Secondary | ICD-10-CM | POA: Diagnosis not present

## 2012-04-24 DIAGNOSIS — M1990 Unspecified osteoarthritis, unspecified site: Secondary | ICD-10-CM | POA: Diagnosis not present

## 2012-04-24 DIAGNOSIS — R269 Unspecified abnormalities of gait and mobility: Secondary | ICD-10-CM | POA: Diagnosis not present

## 2012-04-26 DIAGNOSIS — M1990 Unspecified osteoarthritis, unspecified site: Secondary | ICD-10-CM | POA: Diagnosis not present

## 2012-04-26 DIAGNOSIS — R269 Unspecified abnormalities of gait and mobility: Secondary | ICD-10-CM | POA: Diagnosis not present

## 2012-04-26 DIAGNOSIS — M4800 Spinal stenosis, site unspecified: Secondary | ICD-10-CM | POA: Diagnosis not present

## 2012-04-26 DIAGNOSIS — M86669 Other chronic osteomyelitis, unspecified tibia and fibula: Secondary | ICD-10-CM | POA: Diagnosis not present

## 2012-04-26 DIAGNOSIS — L97509 Non-pressure chronic ulcer of other part of unspecified foot with unspecified severity: Secondary | ICD-10-CM | POA: Diagnosis not present

## 2012-04-26 DIAGNOSIS — E1159 Type 2 diabetes mellitus with other circulatory complications: Secondary | ICD-10-CM | POA: Diagnosis not present

## 2012-04-30 DIAGNOSIS — M4800 Spinal stenosis, site unspecified: Secondary | ICD-10-CM | POA: Diagnosis not present

## 2012-04-30 DIAGNOSIS — M86669 Other chronic osteomyelitis, unspecified tibia and fibula: Secondary | ICD-10-CM | POA: Diagnosis not present

## 2012-04-30 DIAGNOSIS — L97509 Non-pressure chronic ulcer of other part of unspecified foot with unspecified severity: Secondary | ICD-10-CM | POA: Diagnosis not present

## 2012-04-30 DIAGNOSIS — M1990 Unspecified osteoarthritis, unspecified site: Secondary | ICD-10-CM | POA: Diagnosis not present

## 2012-04-30 DIAGNOSIS — R269 Unspecified abnormalities of gait and mobility: Secondary | ICD-10-CM | POA: Diagnosis not present

## 2012-04-30 DIAGNOSIS — E1159 Type 2 diabetes mellitus with other circulatory complications: Secondary | ICD-10-CM | POA: Diagnosis not present

## 2012-05-06 DIAGNOSIS — M47817 Spondylosis without myelopathy or radiculopathy, lumbosacral region: Secondary | ICD-10-CM | POA: Diagnosis not present

## 2012-05-06 DIAGNOSIS — IMO0002 Reserved for concepts with insufficient information to code with codable children: Secondary | ICD-10-CM | POA: Diagnosis not present

## 2012-05-07 DIAGNOSIS — M549 Dorsalgia, unspecified: Secondary | ICD-10-CM | POA: Diagnosis not present

## 2012-05-07 DIAGNOSIS — G609 Hereditary and idiopathic neuropathy, unspecified: Secondary | ICD-10-CM | POA: Diagnosis not present

## 2012-05-16 DIAGNOSIS — M47817 Spondylosis without myelopathy or radiculopathy, lumbosacral region: Secondary | ICD-10-CM | POA: Diagnosis not present

## 2012-05-16 DIAGNOSIS — L97409 Non-pressure chronic ulcer of unspecified heel and midfoot with unspecified severity: Secondary | ICD-10-CM | POA: Diagnosis not present

## 2012-05-16 DIAGNOSIS — IMO0002 Reserved for concepts with insufficient information to code with codable children: Secondary | ICD-10-CM | POA: Diagnosis not present

## 2012-05-17 ENCOUNTER — Telehealth: Payer: Self-pay | Admitting: Internal Medicine

## 2012-05-17 MED ORDER — HYDROCODONE-ACETAMINOPHEN 10-660 MG PO TABS: 1.0000 | ORAL_TABLET | Freq: Four times a day (QID) | ORAL | Status: DC | PRN

## 2012-05-17 NOTE — Telephone Encounter (Signed)
I spoke with the patient, request 120 tablets of hydrocodone. We recently called 60 tablets and one refill. She has not pick up the 60 tablets and is going out of town for 4 weeks consequently needs #120. Will do.  Denies having any problems with excessive somnolence.

## 2012-05-17 NOTE — Telephone Encounter (Signed)
Spoke with pt's pharmacy & they stated that the pt is not out of the hydrocodone but she was requesting #120 instead of #60. She is charged the same price.

## 2012-05-17 NOTE — Telephone Encounter (Signed)
Refill: Hydrocodone-acetaminophen 10-660mg  tabs. Take 1 tablet by mouth every 4 hours if needed for pain. Patient requesting #120. Has #60 left on refill.

## 2012-05-17 NOTE — Telephone Encounter (Signed)
Ok to refill 

## 2012-05-18 ENCOUNTER — Other Ambulatory Visit: Payer: Self-pay | Admitting: Internal Medicine

## 2012-05-28 ENCOUNTER — Other Ambulatory Visit: Payer: Self-pay | Admitting: Internal Medicine

## 2012-05-28 MED ORDER — HYDROCODONE-ACETAMINOPHEN 10-660 MG PO TABS: 1.0000 | ORAL_TABLET | Freq: Four times a day (QID) | ORAL | Status: DC | PRN

## 2012-05-28 NOTE — Telephone Encounter (Signed)
LMOVM for pt to return call 

## 2012-05-28 NOTE — Telephone Encounter (Signed)
Discussed with pt's daughter denise.

## 2012-05-28 NOTE — Telephone Encounter (Signed)
Denied, we send 120 tablets 05/17/2012.  To early

## 2012-05-28 NOTE — Telephone Encounter (Signed)
Ok to refill 

## 2012-05-28 NOTE — Telephone Encounter (Signed)
Refill done.  

## 2012-05-28 NOTE — Telephone Encounter (Signed)
Spoke with pharmacy & they confirmed the rx was picked up by the pt on 7.28.13 but the rx was filled for #60 with zero refills instead of #120. OK to refill rx?

## 2012-05-28 NOTE — Telephone Encounter (Signed)
Still a little early, advise patient she cannot take more than 4 tablets daily, okay to: #120 which is enough for 1 month, no refills

## 2012-05-31 DIAGNOSIS — IMO0002 Reserved for concepts with insufficient information to code with codable children: Secondary | ICD-10-CM | POA: Diagnosis not present

## 2012-05-31 DIAGNOSIS — M47817 Spondylosis without myelopathy or radiculopathy, lumbosacral region: Secondary | ICD-10-CM | POA: Diagnosis not present

## 2012-06-13 DIAGNOSIS — H353190 Nonexudative age-related macular degeneration, unspecified eye, stage unspecified: Secondary | ICD-10-CM | POA: Diagnosis not present

## 2012-06-13 DIAGNOSIS — H524 Presbyopia: Secondary | ICD-10-CM | POA: Diagnosis not present

## 2012-06-17 ENCOUNTER — Ambulatory Visit: Payer: Medicare Other | Admitting: Internal Medicine

## 2012-06-18 DIAGNOSIS — M25579 Pain in unspecified ankle and joints of unspecified foot: Secondary | ICD-10-CM | POA: Diagnosis not present

## 2012-06-19 ENCOUNTER — Ambulatory Visit (INDEPENDENT_AMBULATORY_CARE_PROVIDER_SITE_OTHER): Payer: Medicare Other | Admitting: Internal Medicine

## 2012-06-19 ENCOUNTER — Encounter: Payer: Self-pay | Admitting: Internal Medicine

## 2012-06-19 VITALS — BP 114/72 | HR 89 | Temp 98.0°F | Wt 195.0 lb

## 2012-06-19 DIAGNOSIS — R110 Nausea: Secondary | ICD-10-CM | POA: Diagnosis not present

## 2012-06-19 DIAGNOSIS — I10 Essential (primary) hypertension: Secondary | ICD-10-CM | POA: Diagnosis not present

## 2012-06-19 DIAGNOSIS — N390 Urinary tract infection, site not specified: Secondary | ICD-10-CM

## 2012-06-19 DIAGNOSIS — E119 Type 2 diabetes mellitus without complications: Secondary | ICD-10-CM

## 2012-06-19 DIAGNOSIS — E785 Hyperlipidemia, unspecified: Secondary | ICD-10-CM

## 2012-06-19 DIAGNOSIS — M899 Disorder of bone, unspecified: Secondary | ICD-10-CM

## 2012-06-19 DIAGNOSIS — M8580 Other specified disorders of bone density and structure, unspecified site: Secondary | ICD-10-CM

## 2012-06-19 LAB — POCT URINALYSIS DIPSTICK
Bilirubin, UA: NEGATIVE
Blood, UA: NEGATIVE
Glucose, UA: NEGATIVE
Ketones, UA: NEGATIVE
Leukocytes, UA: NEGATIVE
Nitrite, UA: NEGATIVE
Protein, UA: NEGATIVE
Spec Grav, UA: 1.01
Urobilinogen, UA: 0.2
pH, UA: 6

## 2012-06-19 LAB — CBC WITH DIFFERENTIAL/PLATELET
Basophils Absolute: 0 10*3/uL (ref 0.0–0.1)
Basophils Relative: 0.4 % (ref 0.0–3.0)
Eosinophils Absolute: 0.2 10*3/uL (ref 0.0–0.7)
Eosinophils Relative: 2.9 % (ref 0.0–5.0)
HCT: 38.4 % (ref 36.0–46.0)
Hemoglobin: 12.4 g/dL (ref 12.0–15.0)
Lymphocytes Relative: 38.5 % (ref 12.0–46.0)
Lymphs Abs: 2.5 10*3/uL (ref 0.7–4.0)
MCHC: 32.1 g/dL (ref 30.0–36.0)
MCV: 91.1 fl (ref 78.0–100.0)
Monocytes Absolute: 0.4 10*3/uL (ref 0.1–1.0)
Monocytes Relative: 6.6 % (ref 3.0–12.0)
Neutro Abs: 3.4 10*3/uL (ref 1.4–7.7)
Neutrophils Relative %: 51.6 % (ref 43.0–77.0)
Platelets: 187 10*3/uL (ref 150.0–400.0)
RBC: 4.22 Mil/uL (ref 3.87–5.11)
RDW: 15.5 % — ABNORMAL HIGH (ref 11.5–14.6)
WBC: 6.5 10*3/uL (ref 4.5–10.5)

## 2012-06-19 LAB — HEMOGLOBIN A1C: Hgb A1c MFr Bld: 5.9 % (ref 4.6–6.5)

## 2012-06-19 MED ORDER — AZELASTINE HCL 0.1 % NA SOLN: 2.0000 | Freq: Two times a day (BID) | NASAL | Status: DC

## 2012-06-19 NOTE — Assessment & Plan Note (Signed)
Last cholesterol panel okay except for increased triglycerides. Has been reluctant to take any medication for years

## 2012-06-19 NOTE — Assessment & Plan Note (Signed)
Bone density test 10-2011, showed osteopenia, currently on calcium and vitamin D

## 2012-06-19 NOTE — Assessment & Plan Note (Addendum)
Few weeks history of nausea and also headache, they don't necessarily come together. GI review of systems essentially negative, she does have some runny nose and sinus congestion; she does have a history of migraines but has been asymptomatic for years. Neurological exam is benign except for an absent right knee jerk which could be related to previous surgeries and or neuropathy. Etiology oh HA-nauseaunclear, will prescribe Astelin to help with the nasal congestion, will check a CBC (last hemoglobin low on 02/2014 but that was in the setting of recent surgery) . Advise patient to call me in 3 or 4 weeks if symptoms are not resolving, to call also if they get worse. may need further evaluation.

## 2012-06-19 NOTE — Assessment & Plan Note (Signed)
Well-controlled, last BMP 02/2012 normal.

## 2012-06-19 NOTE — Progress Notes (Signed)
  Subjective:    Patient ID: [name], female    DOB: [birth year], 77 y.o.   MRN: [id number]  HPI Routine visit, we discussed the following issues: --Diabetes, on diet only, CBGs usually 120, never more than 140. --Hypertension, good medication compliance, normal ambulatory BPs. --status post  partial amputation of the left foot, still has a cast on. --UTI? For the last few days has noted some increased urinary frequency, wonders if she has a UTI. Denies specifically dysuria or gross hematuria. --Also for the last few weeks has noted nausea and headache. Sometimes they come together but not necessarily so. She has a history of migraine headaches, but the last severe episode was when she was on her 48s. Current HA is mild, last few hours, in the frontal area , is not the worse of her life. She has noted some runny nose and nose discharge but no fever or chills although she feels "hot" in the afternoon. As far as the nausea, it is usually at night, not immediately postprandial, not associated with abdominal pain, GERD symptoms, vomiting or diarrhea. --Also complains of bilateral hand pain, she already discussed with neurology few months ago and was recommended to possibly see a hand specialist. --Osteopenia, see a/p  Past Medical History: Hypertension Hyperlipidemia Diabetes PE and DVT 12-2007, after neck surgery, CT chest 10-09 neg for PE , coumadin d/c 10-2008 CP 06-2008: admited, r/o for MI, ECHO normal; symptoms likely GI stress test abnormal 2007, but  normal 03-2009 GI PROBLEMS Large hiatal hernia repair 01/25/10 h/o IBS Hemorrhoids HX of Malignant Colon Polyp in 1994, colonoscopy again 8-10, had a biopsy. Next 2015 --------------------------------------------- ?asthma (SOB when attempted  to d/c singulair 11-2008) Pulmonary Nodule , incidental per CT: PET scan 4-9: likely  benign, CT 08-2009 no change, no further CTs( Dr Shelle Iron) REFLEX SYMPATHETIC DYSTROPHY  and Neuropathy, sees Dr  Sandria Manly DJD--chronic ankle pain, orthopedic surgeon Dr.Duda Osteopenia h/ o +PPD early macular degeneration June 2010 (Dr Earlene Plater) Orthostatic intolerance Allergic rhinitis  Past Surgical History: Cholecystectomy Hysterectomy w/o bilateral salpingo-oophorectomy hx of cataract sx   rotator cuff surgery 10-08 Dr Eulah Pont Cspine surgery for OA --fussion, Dr Ophelia Charter (12-18-07) (f/u by a PE) 3th R toe amputation d/t osteomyelitis 11-09 Dr Lajoyce Corners   hiatal hernia surgery, Nissen Fundaplication---(01/25/10) CTS L 09-2011 Left foot amputation, 5th ray  02/2012 --> osteomyelitis, Dr. 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 Breast ca--no   Review of Systems See HPI    Objective:   Physical Exam General -- alert, well-developed . No apparent distress.  Neck --full range of motion HEENT --  not pale or jaundice, face symmetric, nose not congested, sinuses not tender to palpation. Lungs -- normal respiratory effort, no intercostal retractions, no accessory muscle use, and normal breath sounds.   Heart-- normal rate, regular rhythm, no murmur, and no gallop.   Abdomen--soft, non-tender, no distention  Extremities-- cast @ L LE Neurologic-- alert & oriented X3 . EOMI, speech fluent, motor symmetric, DTRs : normal except for absent R knee kerk (ankle DTRs not tested) Psych-- Cognition and judgment appear intact. Alert and cooperative with normal attention span and concentration.  not anxious appearing and not depressed appearing.        Assessment & Plan:

## 2012-06-19 NOTE — Assessment & Plan Note (Signed)
Some urinary frequency, likes her urine checked. udip (-), will send a urine culture and treat if appropriate

## 2012-06-19 NOTE — Assessment & Plan Note (Addendum)
Check a hemoglobin A1c 

## 2012-06-19 NOTE — Patient Instructions (Addendum)
Start a nose spray twice a day If the headache, nausea are not improving in the next few weeks or if they get worse please call me, you will need further evaluation. Next visit in 3-4  months.

## 2012-06-21 MED ORDER — CIPROFLOXACIN HCL 500 MG PO TABS: 500.0000 mg | ORAL_TABLET | Freq: Two times a day (BID) | ORAL | Status: AC

## 2012-06-21 NOTE — Addendum Note (Signed)
Addended by: Edwena Felty T on: 06/21/2012 05:06 PM   Modules accepted: Orders

## 2012-06-22 LAB — URINE CULTURE: Colony Count: >=100000

## 2012-06-26 MED ORDER — NITROFURANTOIN MONOHYD MACRO 100 MG PO CAPS: 100.0000 mg | ORAL_CAPSULE | Freq: Two times a day (BID) | ORAL | Status: AC

## 2012-06-26 NOTE — Addendum Note (Signed)
Addended by: Edwena Felty T on: 06/26/2012 08:47 AM   Modules accepted: Orders

## 2012-07-01 ENCOUNTER — Other Ambulatory Visit: Payer: Self-pay | Admitting: Internal Medicine

## 2012-07-02 NOTE — Telephone Encounter (Signed)
done

## 2012-07-02 NOTE — Telephone Encounter (Signed)
Ok to refill 

## 2012-07-03 ENCOUNTER — Telehealth: Payer: Self-pay | Admitting: *Deleted

## 2012-07-03 NOTE — Telephone Encounter (Signed)
Amoxicillin 500 mg 2 tablets twice a day for one week, #28, no refills. Please confirm with the patient that she is able to take penicillins

## 2012-07-03 NOTE — Telephone Encounter (Signed)
LMOVM for pt to return call 

## 2012-07-03 NOTE — Telephone Encounter (Signed)
Pt c/o hot flashes, still having urge to go even after urinating, burning and frequency with urination. Pt will take last Macrobid tonight but would like to have another round since she is symptomatic.  Pt note that she is currently out of town and is unavailable to given another samples at this time..Please advise

## 2012-07-04 MED ORDER — AMOXICILLIN 500 MG PO CAPS: ORAL_CAPSULE | ORAL | Status: DC

## 2012-07-04 NOTE — Telephone Encounter (Signed)
returned call from 9.11.13 please call at [phone]

## 2012-07-04 NOTE — Telephone Encounter (Signed)
Pt notified and confirmed that she is able to take penicillins. Rx sent to [facility] on [address].

## 2012-07-08 DIAGNOSIS — M47817 Spondylosis without myelopathy or radiculopathy, lumbosacral region: Secondary | ICD-10-CM | POA: Diagnosis not present

## 2012-07-08 DIAGNOSIS — IMO0002 Reserved for concepts with insufficient information to code with codable children: Secondary | ICD-10-CM | POA: Diagnosis not present

## 2012-07-12 DIAGNOSIS — M47817 Spondylosis without myelopathy or radiculopathy, lumbosacral region: Secondary | ICD-10-CM | POA: Diagnosis not present

## 2012-07-12 DIAGNOSIS — IMO0002 Reserved for concepts with insufficient information to code with codable children: Secondary | ICD-10-CM | POA: Diagnosis not present

## 2012-07-24 ENCOUNTER — Ambulatory Visit
Admission: RE | Admit: 2012-07-24 | Discharge: 2012-07-24 | Disposition: A | Discharge status: Home / Self Care | Payer: Medicare Other | Source: Ambulatory Visit | Attending: Orthopedic Surgery | Admitting: Orthopedic Surgery

## 2012-07-24 ENCOUNTER — Other Ambulatory Visit: Payer: Self-pay | Admitting: Orthopedic Surgery

## 2012-07-24 DIAGNOSIS — M545 Low back pain, unspecified: Secondary | ICD-10-CM | POA: Diagnosis not present

## 2012-07-24 DIAGNOSIS — M5137 Other intervertebral disc degeneration, lumbosacral region: Secondary | ICD-10-CM | POA: Diagnosis not present

## 2012-07-30 DIAGNOSIS — M25579 Pain in unspecified ankle and joints of unspecified foot: Secondary | ICD-10-CM | POA: Diagnosis not present

## 2012-07-30 DIAGNOSIS — E1149 Type 2 diabetes mellitus with other diabetic neurological complication: Secondary | ICD-10-CM | POA: Diagnosis not present

## 2012-08-05 DIAGNOSIS — M545 Low back pain, unspecified: Secondary | ICD-10-CM | POA: Diagnosis not present

## 2012-08-05 DIAGNOSIS — M47817 Spondylosis without myelopathy or radiculopathy, lumbosacral region: Secondary | ICD-10-CM | POA: Diagnosis not present

## 2012-08-07 ENCOUNTER — Ambulatory Visit (INDEPENDENT_AMBULATORY_CARE_PROVIDER_SITE_OTHER): Payer: Medicare Other | Admitting: Internal Medicine

## 2012-08-07 VITALS — BP 128/82 | HR 90 | Temp 98.0°F | Wt 199.0 lb

## 2012-08-07 DIAGNOSIS — N390 Urinary tract infection, site not specified: Secondary | ICD-10-CM

## 2012-08-07 DIAGNOSIS — Z23 Encounter for immunization: Secondary | ICD-10-CM

## 2012-08-07 LAB — POCT URINALYSIS DIPSTICK
Bilirubin, UA: NEGATIVE
Glucose, UA: NEGATIVE
Ketones, UA: NEGATIVE
Nitrite, UA: NEGATIVE
Protein, UA: NEGATIVE
Spec Grav, UA: 1.01
Urobilinogen, UA: 0.2
pH, UA: 7.5

## 2012-08-07 NOTE — Progress Notes (Signed)
  Subjective:    Patient ID: [name], female    DOB: [birth year], 77 y.o.   MRN: [id number]  HPI Acute visit Was diagnosed with a UTI 06/19/2012, was treated with Cipro and amoxicillin, still having urinary frequency. Although she does not have bladder incontinence per se, from time to time she is unable to hold her bladder and has "accidents"   Past Medical History: Hypertension Hyperlipidemia Diabetes PE and DVT 12-2007, after neck surgery, CT chest 10-09 neg for PE , coumadin d/c 10-2008 CP 06-2008: admited, r/o for MI, ECHO normal; symptoms likely GI stress test abnormal 2007, but  normal 03-2009 GI PROBLEMS Large hiatal hernia repair 01/25/10 h/o IBS Hemorrhoids HX of Malignant Colon Polyp in 1994, colonoscopy again 8-10, had a biopsy. Next 2015 --------------------------------------------- ?asthma (SOB when attempted  to d/c singulair 11-2008) Pulmonary Nodule , incidental per CT: PET scan 4-9: likely  benign, CT 08-2009 no change, no further CTs( Dr Shelle Iron) REFLEX SYMPATHETIC DYSTROPHY  and Neuropathy, sees Dr Sandria Manly DJD--chronic ankle pain, orthopedic surgeon Dr.Duda Osteopenia h/ o +PPD early macular degeneration June 2010 (Dr Earlene Plater) Orthostatic intolerance Allergic rhinitis  Past Surgical History: Cholecystectomy Hysterectomy w/o bilateral salpingo-oophorectomy hx of cataract sx   rotator cuff surgery 10-08 Dr Eulah Pont Cspine surgery for OA --fussion, Dr Ophelia Charter (12-18-07) (f/u by a PE) 3th R toe amputation d/t osteomyelitis 11-09 Dr Lajoyce Corners   hiatal hernia surgery, Nissen Fundaplication---(01/25/10) CTS L 09-2011 Left foot amputation, 5th ray  02/2012 --> osteomyelitis, Dr. 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 Breast ca--no   Review of  Systems Denies fever or chills No nausea, vomiting, abdominal pain. Occasional dysuria but no gross hematuria Denies any vaginal discharge or vaginal rash. Denies any bowel incontinence.     Objective:   Physical Exam General -- alert, well-developed, and overweight appearing. No apparent distress.  Abdomen--soft,  no distention, slightly tender at the lower abdomen, worse when the left, no mass or rebound   Psych-- Cognition and judgment appear intact. Alert and cooperative with normal attention span and concentration.  not anxious appearing and not depressed appearing.       Assessment & Plan:  Flu shot today

## 2012-08-07 NOTE — Patient Instructions (Addendum)
We are checking a urine culture, will call you with results. Drink plenty of fluids, if you have fever, chills, or you develop abdominal pain, call us

## 2012-08-07 NOTE — Assessment & Plan Note (Addendum)
Urine culture 06/11/2012 showed enterococcus. Status post Cipro and amoxicillin. Her urinary frequency has not changed much. On exam today she has mild lower abdominal tenderness. Plan: Reculture the urine, prescribe antibiotics if appropriate Send back to urology, last visit with them 09/2011.

## 2012-08-08 ENCOUNTER — Encounter: Payer: Self-pay | Admitting: Internal Medicine

## 2012-08-09 LAB — URINE CULTURE: Colony Count: >=100000

## 2012-08-12 MED ORDER — CIPROFLOXACIN HCL 500 MG PO TABS: 500.0000 mg | ORAL_TABLET | Freq: Two times a day (BID) | ORAL | Status: DC

## 2012-08-12 NOTE — Addendum Note (Signed)
Addended by: Edwena Felty T on: 08/12/2012 09:33 AM   Modules accepted: Orders

## 2012-08-22 DIAGNOSIS — M25539 Pain in unspecified wrist: Secondary | ICD-10-CM | POA: Diagnosis not present

## 2012-08-22 DIAGNOSIS — G603 Idiopathic progressive neuropathy: Secondary | ICD-10-CM | POA: Diagnosis not present

## 2012-08-27 DIAGNOSIS — L97409 Non-pressure chronic ulcer of unspecified heel and midfoot with unspecified severity: Secondary | ICD-10-CM | POA: Diagnosis not present

## 2012-08-27 DIAGNOSIS — E1149 Type 2 diabetes mellitus with other diabetic neurological complication: Secondary | ICD-10-CM | POA: Diagnosis not present

## 2012-08-28 DIAGNOSIS — N3020 Other chronic cystitis without hematuria: Secondary | ICD-10-CM | POA: Diagnosis not present

## 2012-08-28 DIAGNOSIS — N318 Other neuromuscular dysfunction of bladder: Secondary | ICD-10-CM | POA: Diagnosis not present

## 2012-08-30 ENCOUNTER — Other Ambulatory Visit: Payer: Self-pay | Admitting: Internal Medicine

## 2012-08-30 DIAGNOSIS — M545 Low back pain, unspecified: Secondary | ICD-10-CM | POA: Diagnosis not present

## 2012-08-30 DIAGNOSIS — E1149 Type 2 diabetes mellitus with other diabetic neurological complication: Secondary | ICD-10-CM | POA: Diagnosis not present

## 2012-08-30 DIAGNOSIS — L97409 Non-pressure chronic ulcer of unspecified heel and midfoot with unspecified severity: Secondary | ICD-10-CM | POA: Diagnosis not present

## 2012-08-30 DIAGNOSIS — M47817 Spondylosis without myelopathy or radiculopathy, lumbosacral region: Secondary | ICD-10-CM | POA: Diagnosis not present

## 2012-08-30 NOTE — Telephone Encounter (Signed)
Ok to refill 

## 2012-08-30 NOTE — Telephone Encounter (Signed)
Done

## 2012-09-17 LAB — HM DIABETES EYE EXAM

## 2012-09-18 DIAGNOSIS — E1149 Type 2 diabetes mellitus with other diabetic neurological complication: Secondary | ICD-10-CM | POA: Diagnosis not present

## 2012-09-18 DIAGNOSIS — L97409 Non-pressure chronic ulcer of unspecified heel and midfoot with unspecified severity: Secondary | ICD-10-CM | POA: Diagnosis not present

## 2012-09-23 DIAGNOSIS — E1149 Type 2 diabetes mellitus with other diabetic neurological complication: Secondary | ICD-10-CM | POA: Diagnosis not present

## 2012-09-23 DIAGNOSIS — L03119 Cellulitis of unspecified part of limb: Secondary | ICD-10-CM | POA: Diagnosis not present

## 2012-09-23 DIAGNOSIS — L02619 Cutaneous abscess of unspecified foot: Secondary | ICD-10-CM | POA: Diagnosis not present

## 2012-09-23 DIAGNOSIS — L97409 Non-pressure chronic ulcer of unspecified heel and midfoot with unspecified severity: Secondary | ICD-10-CM | POA: Diagnosis not present

## 2012-09-24 ENCOUNTER — Other Ambulatory Visit (HOSPITAL_COMMUNITY): Payer: Self-pay | Admitting: Orthopedic Surgery

## 2012-09-24 ENCOUNTER — Other Ambulatory Visit: Payer: Self-pay | Admitting: *Deleted

## 2012-09-24 ENCOUNTER — Encounter (HOSPITAL_COMMUNITY): Payer: Self-pay | Admitting: Pharmacy Technician

## 2012-09-24 MED ORDER — OMEPRAZOLE 20 MG PO CPDR: 20.0000 mg | DELAYED_RELEASE_CAPSULE | Freq: Two times a day (BID) | ORAL | Status: DC

## 2012-09-25 ENCOUNTER — Encounter (HOSPITAL_COMMUNITY): Payer: Self-pay

## 2012-09-25 ENCOUNTER — Encounter (HOSPITAL_COMMUNITY): Payer: Self-pay | Admitting: Pharmacy Technician

## 2012-09-25 ENCOUNTER — Encounter (HOSPITAL_COMMUNITY)
Admission: RE | Admit: 2012-09-25 | Discharge: 2012-09-25 | Disposition: A | Discharge status: Home / Self Care | Payer: Medicare Other | Source: Ambulatory Visit | Attending: Orthopedic Surgery | Admitting: Orthopedic Surgery

## 2012-09-25 HISTORY — DX: Polyneuropathy, unspecified: G62.9

## 2012-09-25 LAB — CBC
HCT: 34.5 % — ABNORMAL LOW (ref 36.0–46.0)
Hemoglobin: 10.9 g/dL — ABNORMAL LOW (ref 12.0–15.0)
MCH: 29.1 pg (ref 26.0–34.0)
MCHC: 31.6 g/dL (ref 30.0–36.0)
MCV: 92 fL (ref 78.0–100.0)
Platelets: 209 10*3/uL (ref 150–400)
RBC: 3.75 MIL/uL — ABNORMAL LOW (ref 3.87–5.11)
RDW: 14 % (ref 11.5–15.5)
WBC: 7.6 10*3/uL (ref 4.0–10.5)

## 2012-09-25 LAB — COMPREHENSIVE METABOLIC PANEL
ALT: 8 U/L (ref 0–35)
AST: 13 U/L (ref 0–37)
Albumin: 3.5 g/dL (ref 3.5–5.2)
Alkaline Phosphatase: 99 U/L (ref 39–117)
BUN: 14 mg/dL (ref 6–23)
CO2: 25 mEq/L (ref 19–32)
Calcium: 9.2 mg/dL (ref 8.4–10.5)
Chloride: 104 mEq/L (ref 96–112)
Creatinine, Ser: 0.95 mg/dL (ref 0.50–1.10)
GFR calc Af Amer: 66 mL/min — ABNORMAL LOW (ref >90)
GFR calc non Af Amer: 57 mL/min — ABNORMAL LOW (ref >90)
Glucose, Bld: 127 mg/dL — ABNORMAL HIGH (ref 70–99)
Potassium: 5 mEq/L (ref 3.5–5.1)
Sodium: 137 mEq/L (ref 135–145)
Total Bilirubin: 0.2 mg/dL — ABNORMAL LOW (ref 0.3–1.2)
Total Protein: 6.8 g/dL (ref 6.0–8.3)

## 2012-09-25 LAB — SURGICAL PCR SCREEN
MRSA, PCR: NEGATIVE
Staphylococcus aureus: NEGATIVE

## 2012-09-25 LAB — PROTIME-INR
INR: 0.98 (ref 0.00–1.49)
Prothrombin Time: 12.9 seconds (ref 11.6–15.2)

## 2012-09-25 LAB — APTT: aPTT: 29 seconds (ref 24–37)

## 2012-09-25 NOTE — Progress Notes (Signed)
Forwarded chart to anesthesia to review, cardiac records by Dr. Gala Romney, EKG from 03/12/12 and CXR from 03/05/12

## 2012-09-25 NOTE — Pre-Procedure Instructions (Signed)
20 [name]  09/25/2012   Your procedure is scheduled on:  Friday September 27, 2012  Report to [facility] at 8:30 AM.  Call this number if you have problems the morning of surgery: [phone]   Remember:   Do not eat food or drink :After Midnight.    Take these medicines the morning of surgery with A SIP OF WATER: hydrocodone, antivert (if needed), omeprazole, bactrim, trimpex   Do not wear jewelry, make-up or nail polish.  Do not wear lotions, powders, or perfumes.   Do not shave 48 hours prior to surgery.  Do not bring valuables to the hospital.  Contacts, dentures or bridgework may not be worn into surgery.  Leave suitcase in the car. After surgery it may be brought to your room.  For patients admitted to the hospital, checkout time is 11:00 AM the day of discharge.   Patients discharged the day of surgery will not be allowed to drive home.  Name and phone number of your driver: family / friend  Special Instructions: Shower using CHG 2 nights before surgery and the night before surgery.  If you shower the day of surgery use CHG.  Use special wash - you have one bottle of CHG for all showers.  You should use approximately 1/3 of the bottle for each shower.   Please read over the following fact sheets that you were given: Pain Booklet, Coughing and Deep Breathing, MRSA Information and Surgical Site Infection Prevention

## 2012-09-26 ENCOUNTER — Other Ambulatory Visit (HOSPITAL_COMMUNITY): Payer: Self-pay | Admitting: Orthopedic Surgery

## 2012-09-26 MED ORDER — CLINDAMYCIN PHOSPHATE 900 MG/50ML IV SOLN
900.0000 mg | INTRAVENOUS | Status: AC
  Administered 2012-09-27: 900 mg via INTRAVENOUS
  Filled 2012-09-26: qty 50

## 2012-09-26 NOTE — Consult Note (Signed)
Anesthesia Chart Review:  Patient is a 77 year old female scheduled for left tibiocalcaneal fusion by Dr. Lajoyce Corners on 09/27/12.  History includes left fifth toe ray amputation 03/06/12, DM2, HTN, HLD, obesity, non-smoker, post-operative DVT/PE '09 with history of IVC filter, osteoporosis, IBS, macular degeneration, syncope, asthma, hiatal hernia, post-operative N/V, GERD, cataract extraction, nasal septum surgery, c-spine surgery.  PCP is Dr. [name].  Her preoperative EKG on 03/05/12 showed NSR, possible lateral infarct (age undetermined)--finding felt most likely related to arm lead reversal.    Has been evaluated by [facility] in the past for dyspnea, last visit on 12/07/10 by Dr. Gala Romney.  (Previously was being seen by Dr. Johney Frame.) He recommended an event monitor which only showed NSR.  He felt her dyspnea was mostly due to deconditioning.  Cardiac cath on 05/27/10 showed 1. Nonobstructive coronary artery disease primarily in the LAD with mostly branch vessel disease (pLAD 20%, mLAD just after takeoff of the DIAG 40%, pDIAG 40-50% extending into the upper branch with 70-80% in the lower branch. 2. Mild pulmonary venous hypertension. 3. No evidence of intracardiac shunt or platypnea-orthodeoxia.    TEE on 05/27/2010 showed negative bubble study for shunt, normal EF was 60%, trivial mitral regurgitation, mild left atrial enlargement, normal right-sided cardiac chambers, redundant atrial septum with no PFO and negative bubble study.  CPX on 06/22/10 showed: "Spriometry normal. pVO2 15.2 (95% predicted) adjusts to 20.7 when corrected for ideal body weight. ve/vco2 slope markedly elevated at 46. O2 pulse 100%. VE/MVV 56%. Sats 97% throughot exercise felt to be mostly a ventilatory limitation. Cannot exclude concomittant circulatory limitation."  Pulmonary rehab was recommended.  CXR on 03/05/12 showed: Peribronchial thickening and interstitial opacities suggesting  chronic bronchitis. No significant  change. No evidence of active disease.  Preoperative labs noted.  She tolerated surgery earlier this year.  She will be evaluated by her assigned Anesthesiologist on the day of surgery, but if no acute cardiopulmonary symptoms then anticipate she can proceed as planned.  Shonna Chock, PA-C 09/26/12 1025

## 2012-09-27 ENCOUNTER — Encounter (HOSPITAL_COMMUNITY): Payer: Self-pay | Admitting: Vascular Surgery

## 2012-09-27 ENCOUNTER — Encounter (HOSPITAL_COMMUNITY)
Admission: RE | Disposition: A | Discharge status: Skilled Nursing Facility | Payer: Self-pay | Source: Ambulatory Visit | Attending: Orthopedic Surgery

## 2012-09-27 ENCOUNTER — Encounter (HOSPITAL_COMMUNITY): Payer: Self-pay | Admitting: General Practice

## 2012-09-27 ENCOUNTER — Inpatient Hospital Stay (HOSPITAL_COMMUNITY): Payer: Medicare Other | Admitting: Vascular Surgery

## 2012-09-27 ENCOUNTER — Inpatient Hospital Stay (HOSPITAL_COMMUNITY)
Admission: RE | Admit: 2012-09-27 | Discharge: 2012-09-30 | DRG: 982 | Disposition: A | Discharge status: Skilled Nursing Facility | Payer: Medicare Other | Source: Ambulatory Visit | Attending: Orthopedic Surgery | Admitting: Orthopedic Surgery

## 2012-09-27 ENCOUNTER — Encounter (HOSPITAL_COMMUNITY): Payer: Self-pay | Admitting: *Deleted

## 2012-09-27 DIAGNOSIS — I10 Essential (primary) hypertension: Secondary | ICD-10-CM | POA: Diagnosis present

## 2012-09-27 DIAGNOSIS — IMO0001 Reserved for inherently not codable concepts without codable children: Secondary | ICD-10-CM | POA: Diagnosis not present

## 2012-09-27 DIAGNOSIS — K219 Gastro-esophageal reflux disease without esophagitis: Secondary | ICD-10-CM | POA: Diagnosis not present

## 2012-09-27 DIAGNOSIS — G8918 Other acute postprocedural pain: Secondary | ICD-10-CM | POA: Diagnosis not present

## 2012-09-27 DIAGNOSIS — E1149 Type 2 diabetes mellitus with other diabetic neurological complication: Secondary | ICD-10-CM | POA: Diagnosis not present

## 2012-09-27 DIAGNOSIS — M810 Age-related osteoporosis without current pathological fracture: Secondary | ICD-10-CM | POA: Diagnosis present

## 2012-09-27 DIAGNOSIS — E119 Type 2 diabetes mellitus without complications: Secondary | ICD-10-CM

## 2012-09-27 DIAGNOSIS — J45909 Unspecified asthma, uncomplicated: Secondary | ICD-10-CM | POA: Diagnosis present

## 2012-09-27 DIAGNOSIS — M2180 Other specified acquired deformities of unspecified limb: Secondary | ICD-10-CM | POA: Diagnosis not present

## 2012-09-27 DIAGNOSIS — Z86718 Personal history of other venous thrombosis and embolism: Secondary | ICD-10-CM

## 2012-09-27 DIAGNOSIS — M19079 Primary osteoarthritis, unspecified ankle and foot: Secondary | ICD-10-CM | POA: Diagnosis not present

## 2012-09-27 DIAGNOSIS — Z86711 Personal history of pulmonary embolism: Secondary | ICD-10-CM | POA: Diagnosis not present

## 2012-09-27 DIAGNOSIS — H3530 Unspecified macular degeneration: Secondary | ICD-10-CM | POA: Diagnosis present

## 2012-09-27 DIAGNOSIS — E1142 Type 2 diabetes mellitus with diabetic polyneuropathy: Secondary | ICD-10-CM | POA: Diagnosis present

## 2012-09-27 DIAGNOSIS — R279 Unspecified lack of coordination: Secondary | ICD-10-CM | POA: Diagnosis not present

## 2012-09-27 DIAGNOSIS — L97509 Non-pressure chronic ulcer of other part of unspecified foot with unspecified severity: Secondary | ICD-10-CM | POA: Diagnosis present

## 2012-09-27 DIAGNOSIS — I798 Other disorders of arteries, arterioles and capillaries in diseases classified elsewhere: Secondary | ICD-10-CM | POA: Diagnosis present

## 2012-09-27 DIAGNOSIS — E1159 Type 2 diabetes mellitus with other circulatory complications: Secondary | ICD-10-CM | POA: Diagnosis present

## 2012-09-27 DIAGNOSIS — L97409 Non-pressure chronic ulcer of unspecified heel and midfoot with unspecified severity: Secondary | ICD-10-CM | POA: Diagnosis not present

## 2012-09-27 DIAGNOSIS — Z01812 Encounter for preprocedural laboratory examination: Secondary | ICD-10-CM

## 2012-09-27 DIAGNOSIS — M1990 Unspecified osteoarthritis, unspecified site: Secondary | ICD-10-CM | POA: Diagnosis present

## 2012-09-27 DIAGNOSIS — Z79899 Other long term (current) drug therapy: Secondary | ICD-10-CM

## 2012-09-27 DIAGNOSIS — R262 Difficulty in walking, not elsewhere classified: Secondary | ICD-10-CM | POA: Diagnosis not present

## 2012-09-27 DIAGNOSIS — M216X9 Other acquired deformities of unspecified foot: Secondary | ICD-10-CM | POA: Diagnosis not present

## 2012-09-27 DIAGNOSIS — S98139A Complete traumatic amputation of one unspecified lesser toe, initial encounter: Secondary | ICD-10-CM

## 2012-09-27 DIAGNOSIS — K589 Irritable bowel syndrome without diarrhea: Secondary | ICD-10-CM | POA: Diagnosis present

## 2012-09-27 DIAGNOSIS — G9050 Complex regional pain syndrome I, unspecified: Secondary | ICD-10-CM | POA: Diagnosis not present

## 2012-09-27 DIAGNOSIS — M14679 Charcot's joint, unspecified ankle and foot: Secondary | ICD-10-CM

## 2012-09-27 DIAGNOSIS — M79609 Pain in unspecified limb: Secondary | ICD-10-CM | POA: Diagnosis not present

## 2012-09-27 DIAGNOSIS — E785 Hyperlipidemia, unspecified: Secondary | ICD-10-CM | POA: Diagnosis present

## 2012-09-27 DIAGNOSIS — A5211 Tabes dorsalis: Principal | ICD-10-CM | POA: Diagnosis present

## 2012-09-27 HISTORY — PX: ANKLE FUSION: SHX5718

## 2012-09-27 LAB — GLUCOSE, CAPILLARY
Glucose-Capillary: 123 mg/dL — ABNORMAL HIGH (ref 70–99)
Glucose-Capillary: 155 mg/dL — ABNORMAL HIGH (ref 70–99)
Glucose-Capillary: 114 mg/dL — ABNORMAL HIGH (ref 70–99)

## 2012-09-27 SURGERY — ANKLE FUSION
Anesthesia: General | Site: Ankle | Laterality: Left | Wound class: Clean

## 2012-09-27 MED ORDER — ONDANSETRON HCL 4 MG/2ML IJ SOLN: 4.0000 mg | Freq: Four times a day (QID) | INTRAMUSCULAR | Status: DC | PRN

## 2012-09-27 MED ORDER — HYDROMORPHONE HCL PF 1 MG/ML IJ SOLN: 0.5000 mg | INTRAMUSCULAR | Status: DC | PRN

## 2012-09-27 MED ORDER — GENTAMICIN SULFATE 40 MG/ML IJ SOLN
INTRAMUSCULAR | Status: DC | PRN
  Administered 2012-09-27: 6 mL via INTRAMUSCULAR

## 2012-09-27 MED ORDER — LIDOCAINE-EPINEPHRINE (PF) 1.5 %-1:200000 IJ SOLN
INTRAMUSCULAR | Status: DC | PRN
  Administered 2012-09-27: 30 mL

## 2012-09-27 MED ORDER — OXYCODONE-ACETAMINOPHEN 5-325 MG PO TABS
1.0000 | ORAL_TABLET | ORAL | Status: DC | PRN
  Administered 2012-09-27 – 2012-09-30 (×8): 2 via ORAL
  Filled 2012-09-27 (×8): qty 2

## 2012-09-27 MED ORDER — LACTATED RINGERS IV SOLN
INTRAVENOUS | Status: DC | PRN
  Administered 2012-09-27: 11:00:00 via INTRAVENOUS

## 2012-09-27 MED ORDER — NEOSTIGMINE METHYLSULFATE 1 MG/ML IJ SOLN
INTRAMUSCULAR | Status: DC | PRN
  Administered 2012-09-27: 4 mg via INTRAVENOUS

## 2012-09-27 MED ORDER — SCOPOLAMINE 1 MG/3DAYS TD PT72
MEDICATED_PATCH | TRANSDERMAL | Status: AC
  Filled 2012-09-27: qty 1

## 2012-09-27 MED ORDER — SODIUM CHLORIDE 0.9 % IV SOLN: INTRAVENOUS | Status: DC

## 2012-09-27 MED ORDER — VANCOMYCIN HCL 1000 MG IV SOLR
INTRAVENOUS | Status: DC | PRN
  Administered 2012-09-27: 1 g

## 2012-09-27 MED ORDER — WARFARIN - PHARMACIST DOSING INPATIENT: Freq: Every day | Status: DC

## 2012-09-27 MED ORDER — PANTOPRAZOLE SODIUM 40 MG PO TBEC
40.0000 mg | DELAYED_RELEASE_TABLET | Freq: Every day | ORAL | Status: DC
  Administered 2012-09-27 – 2012-09-30 (×4): 40 mg via ORAL
  Filled 2012-09-27 (×4): qty 1

## 2012-09-27 MED ORDER — PATIENT'S GUIDE TO USING COUMADIN BOOK
Freq: Once | Status: AC
  Administered 2012-09-27: 17:00:00
  Filled 2012-09-27: qty 1

## 2012-09-27 MED ORDER — GENTAMICIN SULFATE 40 MG/ML IJ SOLN
INTRAMUSCULAR | Status: AC
  Filled 2012-09-27: qty 2

## 2012-09-27 MED ORDER — CLINDAMYCIN PHOSPHATE 600 MG/50ML IV SOLN
600.0000 mg | Freq: Four times a day (QID) | INTRAVENOUS | Status: AC
  Administered 2012-09-27 – 2012-09-28 (×3): 600 mg via INTRAVENOUS
  Filled 2012-09-27 (×3): qty 50

## 2012-09-27 MED ORDER — VANCOMYCIN HCL 1000 MG IV SOLR
INTRAVENOUS | Status: AC
  Filled 2012-09-27: qty 1000

## 2012-09-27 MED ORDER — METOCLOPRAMIDE HCL 10 MG PO TABS: 5.0000 mg | ORAL_TABLET | Freq: Three times a day (TID) | ORAL | Status: DC | PRN

## 2012-09-27 MED ORDER — ONDANSETRON HCL 4 MG PO TABS: 4.0000 mg | ORAL_TABLET | Freq: Four times a day (QID) | ORAL | Status: DC | PRN

## 2012-09-27 MED ORDER — ONDANSETRON HCL 4 MG/2ML IJ SOLN: 4.0000 mg | Freq: Once | INTRAMUSCULAR | Status: DC | PRN

## 2012-09-27 MED ORDER — SCOPOLAMINE 1 MG/3DAYS TD PT72
MEDICATED_PATCH | TRANSDERMAL | Status: DC | PRN
  Administered 2012-09-27: 1 via TRANSDERMAL

## 2012-09-27 MED ORDER — MIDAZOLAM HCL 2 MG/2ML IJ SOLN
INTRAMUSCULAR | Status: AC
  Filled 2012-09-27: qty 2

## 2012-09-27 MED ORDER — FENTANYL CITRATE 0.05 MG/ML IJ SOLN
INTRAMUSCULAR | Status: AC
  Filled 2012-09-27: qty 2

## 2012-09-27 MED ORDER — BUPIVACAINE-EPINEPHRINE PF 0.5-1:200000 % IJ SOLN
INTRAMUSCULAR | Status: DC | PRN
  Administered 2012-09-27: 30 mL

## 2012-09-27 MED ORDER — METOCLOPRAMIDE HCL 5 MG/ML IJ SOLN: 5.0000 mg | Freq: Three times a day (TID) | INTRAMUSCULAR | Status: DC | PRN

## 2012-09-27 MED ORDER — PROPOFOL 10 MG/ML IV BOLUS
INTRAVENOUS | Status: DC | PRN
  Administered 2012-09-27: 100 mg via INTRAVENOUS

## 2012-09-27 MED ORDER — HYDROCODONE-ACETAMINOPHEN 5-325 MG PO TABS
1.0000 | ORAL_TABLET | ORAL | Status: DC | PRN
  Administered 2012-09-27 – 2012-09-30 (×4): 2 via ORAL
  Filled 2012-09-27 (×4): qty 2

## 2012-09-27 MED ORDER — FENTANYL CITRATE 0.05 MG/ML IJ SOLN
100.0000 ug | Freq: Once | INTRAMUSCULAR | Status: AC
  Administered 2012-09-27: 100 ug via INTRAVENOUS

## 2012-09-27 MED ORDER — ROCURONIUM BROMIDE 100 MG/10ML IV SOLN
INTRAVENOUS | Status: DC | PRN
  Administered 2012-09-27: 50 mg via INTRAVENOUS

## 2012-09-27 MED ORDER — GENTAMICIN SULFATE 40 MG/ML IJ SOLN
INTRAMUSCULAR | Status: AC
  Filled 2012-09-27: qty 4

## 2012-09-27 MED ORDER — FENTANYL CITRATE 0.05 MG/ML IJ SOLN
INTRAMUSCULAR | Status: DC | PRN
  Administered 2012-09-27: 150 ug via INTRAVENOUS

## 2012-09-27 MED ORDER — ONDANSETRON HCL 4 MG/2ML IJ SOLN
INTRAMUSCULAR | Status: DC | PRN
  Administered 2012-09-27: 4 mg via INTRAVENOUS

## 2012-09-27 MED ORDER — PHENYLEPHRINE HCL 10 MG/ML IJ SOLN
INTRAMUSCULAR | Status: DC | PRN
  Administered 2012-09-27 (×2): 80 ug via INTRAVENOUS
  Administered 2012-09-27: 40 ug via INTRAVENOUS
  Administered 2012-09-27: 80 ug via INTRAVENOUS
  Administered 2012-09-27 (×9): 40 ug via INTRAVENOUS
  Administered 2012-09-27: 80 ug via INTRAVENOUS

## 2012-09-27 MED ORDER — LIDOCAINE HCL (CARDIAC) 20 MG/ML IV SOLN
INTRAVENOUS | Status: DC | PRN
  Administered 2012-09-27: 100 mg via INTRAVENOUS

## 2012-09-27 MED ORDER — MEPERIDINE HCL 25 MG/ML IJ SOLN: 6.2500 mg | INTRAMUSCULAR | Status: DC | PRN

## 2012-09-27 MED ORDER — WARFARIN SODIUM 5 MG PO TABS
5.0000 mg | ORAL_TABLET | Freq: Once | ORAL | Status: AC
  Administered 2012-09-27: 5 mg via ORAL
  Filled 2012-09-27: qty 1

## 2012-09-27 MED ORDER — MIDAZOLAM HCL 5 MG/ML IJ SOLN
0.5000 mg | Freq: Once | INTRAMUSCULAR | Status: AC
  Administered 2012-09-27: 0.5 mg via INTRAVENOUS

## 2012-09-27 MED ORDER — GLYCOPYRROLATE 0.2 MG/ML IJ SOLN
INTRAMUSCULAR | Status: DC | PRN
  Administered 2012-09-27: .6 mg via INTRAVENOUS

## 2012-09-27 MED ORDER — MECLIZINE HCL 25 MG PO TABS
25.0000 mg | ORAL_TABLET | Freq: Two times a day (BID) | ORAL | Status: DC | PRN
  Filled 2012-09-27: qty 1

## 2012-09-27 SURGICAL SUPPLY — 56 items
BANDAGE ESMARK 6X9 LF (GAUZE/BANDAGES/DRESSINGS) IMPLANT
BANDAGE GAUZE ELAST BULKY 4 IN (GAUZE/BANDAGES/DRESSINGS) ×4 IMPLANT
BIOMET BEAT TIP GUIDEWIRE ×1 IMPLANT
BIT DRILL CALIBRATED 4.3X320MM (BIT) IMPLANT
BIT DRILL CANN 7X200 (BIT) ×1 IMPLANT
BLADE SAW SGTL HD 18.5X60.5X1. (BLADE) ×2 IMPLANT
BLADE SURG 10 STRL SS (BLADE) IMPLANT
BNDG CMPR 9X6 STRL LF SNTH (GAUZE/BANDAGES/DRESSINGS) ×1
BNDG COHESIVE 4X5 TAN STRL (GAUZE/BANDAGES/DRESSINGS) ×2 IMPLANT
BNDG COHESIVE 6X5 TAN STRL LF (GAUZE/BANDAGES/DRESSINGS) ×1 IMPLANT
BNDG ESMARK 6X9 LF (GAUZE/BANDAGES/DRESSINGS) ×2
CLOTH BEACON ORANGE TIMEOUT ST (SAFETY) ×2 IMPLANT
COTTON STERILE ROLL (GAUZE/BANDAGES/DRESSINGS) ×2 IMPLANT
COVER MAYO STAND STRL (DRAPES) IMPLANT
COVER SURGICAL LIGHT HANDLE (MISCELLANEOUS) ×2 IMPLANT
DRAPE INCISE IOBAN 66X45 STRL (DRAPES) ×2 IMPLANT
DRAPE OEC MINIVIEW 54X84 (DRAPES) IMPLANT
DRAPE U-SHAPE 47X51 STRL (DRAPES) ×2 IMPLANT
DRILL CALIBRATED 4.3X320MM (BIT) ×2
DRSG ADAPTIC 3X8 NADH LF (GAUZE/BANDAGES/DRESSINGS) ×2 IMPLANT
DRSG PAD ABDOMINAL 8X10 ST (GAUZE/BANDAGES/DRESSINGS) ×1 IMPLANT
DURAPREP 26ML APPLICATOR (WOUND CARE) ×2 IMPLANT
ELECT REM PT RETURN 9FT ADLT (ELECTROSURGICAL) ×2
ELECTRODE REM PT RTRN 9FT ADLT (ELECTROSURGICAL) ×1 IMPLANT
GLOVE BIOGEL PI IND STRL 9 (GLOVE) ×1 IMPLANT
GLOVE BIOGEL PI INDICATOR 9 (GLOVE) ×1
GLOVE SURG ORTHO 9.0 STRL STRW (GLOVE) ×2 IMPLANT
GLOVE SURG SS PI 7.5 STRL IVOR (GLOVE) ×2 IMPLANT
GOWN PREVENTION PLUS XLARGE (GOWN DISPOSABLE) ×2 IMPLANT
GOWN SRG XL XLNG 56XLVL 4 (GOWN DISPOSABLE) ×2 IMPLANT
GOWN STRL NON-REIN XL XLG LVL4 (GOWN DISPOSABLE) ×4
KIT BASIN OR (CUSTOM PROCEDURE TRAY) ×2 IMPLANT
KIT ROOM TURNOVER OR (KITS) ×2 IMPLANT
KIT STIMULAN RAPID CURE  10CC (Orthopedic Implant) ×1 IMPLANT
KIT STIMULAN RAPID CURE 10CC (Orthopedic Implant) IMPLANT
LAG SCREW GUIDEWIRE ×1 IMPLANT
MANIFOLD NEPTUNE II (INSTRUMENTS) ×2 IMPLANT
NAIL LOCK ANKLE ANTE 11X180 (Nail) ×1 IMPLANT
NS IRRIG 1000ML POUR BTL (IV SOLUTION) ×2 IMPLANT
PACK ORTHO EXTREMITY (CUSTOM PROCEDURE TRAY) ×2 IMPLANT
PAD ARMBOARD 7.5X6 YLW CONV (MISCELLANEOUS) ×4 IMPLANT
PAD CAST 4YDX4 CTTN HI CHSV (CAST SUPPLIES) ×1 IMPLANT
PADDING CAST ABS 4INX4YD NS (CAST SUPPLIES) ×1
PADDING CAST ABS COTTON 4X4 ST (CAST SUPPLIES) IMPLANT
PADDING CAST COTTON 4X4 STRL (CAST SUPPLIES) ×2
PADDING CAST COTTON 6X4 STRL (CAST SUPPLIES) ×1 IMPLANT
SCREW CORT TI DBL LEAD 5X26 (Screw) ×2 IMPLANT
SCREW CORT TI DBL LEAD 5X70 (Screw) ×1 IMPLANT
SPONGE GAUZE 4X4 12PLY (GAUZE/BANDAGES/DRESSINGS) ×2 IMPLANT
SPONGE LAP 18X18 X RAY DECT (DISPOSABLE) ×2 IMPLANT
SUCTION FRAZIER TIP 10 FR DISP (SUCTIONS) ×2 IMPLANT
SUT ETHILON 2 0 PSLX (SUTURE) ×6 IMPLANT
TOWEL OR 17X24 6PK STRL BLUE (TOWEL DISPOSABLE) ×2 IMPLANT
TOWEL OR 17X26 10 PK STRL BLUE (TOWEL DISPOSABLE) ×2 IMPLANT
TUBE CONNECTING 12X1/4 (SUCTIONS) ×2 IMPLANT
WATER STERILE IRR 1000ML POUR (IV SOLUTION) ×2 IMPLANT

## 2012-09-27 NOTE — Anesthesia Procedure Notes (Addendum)
Anesthesia Regional Block:  Popliteal block  Pre-Anesthetic Checklist: ,, timeout performed, Correct Patient, Correct Site, Correct Laterality, Correct Procedure, Correct Position, site marked, Risks and benefits discussed,  Surgical consent,  Pre-op evaluation,  At surgeon's request and post-op pain management  Laterality: Left  Prep: chloraprep       Needles:  Injection technique: Single-shot  Needle Type: Echogenic Stimulator Needle     Needle Length: 10cm 10 cm     Additional Needles:  Procedures: ultrasound guided (picture in chart) and nerve stimulator Popliteal block  Nerve Stimulator or Paresthesia:  Response: 0.4 mA,   Additional Responses:   Narrative:  Start time: 09/27/2012 10:15 AM End time: 09/27/2012 10:50 AM Injection made incrementally with aspirations every 5 mL.  Performed by: Personally  Anesthesiologist: Arta Bruce MD  Additional Notes: Monitors applied. Patient sedated. Sterile prep and drape,hand hygiene and sterile gloves were used. Relevant anatomy identified.Needle position confirmed.Local anesthetic injected incrementally after negative aspiration. Local anesthetic spread visualized around nerve(s). Vascular puncture avoided. No complications. Image printed for medical record.The patient tolerated the procedure well.  Additional Saphenous nerve block performed. 15cc Local Anesthetic mixture placed under ultrasonic guidance along the medio-inferior border of the Sartorious muscle 6 inches above the knee.  No Problems encountered.  Arta Bruce MD   Popliteal block Procedure Name: Intubation Date/Time: 09/27/2012 11:20 AM Performed by: Angelica Pou Pre-anesthesia Checklist: Patient identified, Emergency Drugs available, Suction available, Patient being monitored and Timeout performed Patient Re-evaluated:Patient Re-evaluated prior to inductionOxygen Delivery Method: Circle system utilized Preoxygenation: Pre-oxygenation with 100%  oxygen Intubation Type: IV induction Ventilation: Mask ventilation without difficulty Laryngoscope Size: Mac and 3 Grade View: Grade I Tube type: Oral Tube size: 7.0 mm Number of attempts: 1 Airway Equipment and Method: Stylet Placement Confirmation: ETT inserted through vocal cords under direct vision,  positive ETCO2 and breath sounds checked- equal and bilateral Secured at: 22 cm Tube secured with: Tape Dental Injury: Teeth and Oropharynx as per pre-operative assessment

## 2012-09-27 NOTE — Progress Notes (Signed)
Patient requests that when visitors are in her room she would prefer to receive no medications or have any testing done until the room is free from visitors.

## 2012-09-27 NOTE — Anesthesia Preprocedure Evaluation (Signed)
Anesthesia Evaluation  Patient identified by MRN, date of birth, ID band Patient awake    Reviewed: Allergy & Precautions, H&P , NPO status , Patient's Chart, lab work & pertinent test results  History of Anesthesia Complications (+) PONV  Airway Mallampati: I TM Distance: >3 FB Neck ROM: Full    Dental   Pulmonary          Cardiovascular hypertension, Pt. on medications     Neuro/Psych    GI/Hepatic GERD-  ,  Endo/Other  diabetes, Well Controlled, Type 2  Renal/GU      Musculoskeletal   Abdominal   Peds  Hematology   Anesthesia Other Findings   Reproductive/Obstetrics                           Anesthesia Physical Anesthesia Plan  ASA: III  Anesthesia Plan: General   Post-op Pain Management:    Induction: Intravenous  Airway Management Planned: LMA  Additional Equipment:   Intra-op Plan:   Post-operative Plan: Extubation in OR  Informed Consent: I have reviewed the patients History and Physical, chart, labs and discussed the procedure including the risks, benefits and alternatives for the proposed anesthesia with the patient or authorized representative who has indicated his/her understanding and acceptance.     Plan Discussed with: CRNA and Surgeon  Anesthesia Plan Comments:         Anesthesia Quick Evaluation

## 2012-09-27 NOTE — Progress Notes (Signed)
Orthopedic Tech Progress Note Patient Details:  [name] [birth year] [id number]  Ortho Devices Type of Ortho Device: CAM walker Ortho Device/Splint Location: (L) LE Ortho Device/Splint Interventions: Ordered;Application   Jennye Moccasin 09/27/2012, 9:23 PM

## 2012-09-27 NOTE — Transfer of Care (Signed)
Immediate Anesthesia Transfer of Care Note  Patient: [name]  Procedure(s) Performed: Procedure(s) (LRB) with comments: ANKLE FUSION (Left) - Left Tibiocalcaneal Fusion  Patient Location: PACU  Anesthesia Type:General  Level of Consciousness: awake, oriented, sedated and patient cooperative  Airway & Oxygen Therapy: Patient Spontanous Breathing and Patient connected to nasal cannula oxygen  Post-op Assessment: Report given to PACU RN and Post -op Vital signs reviewed and stable  Post vital signs: Reviewed and stable  Complications: No apparent anesthesia complications

## 2012-09-27 NOTE — Op Note (Signed)
OPERATIVE REPORT  DATE OF SURGERY: 09/27/2012  PATIENT:  [name],  77 y.o. female  PRE-OPERATIVE DIAGNOSIS:  charcot collapse left ankle  POST-OPERATIVE DIAGNOSIS:  charcot collapse left ankle  PROCEDURE:  Procedure(s): Tibial calcaneal fusion. Placement of antibiotic beads with 1 g vancomycin 240 mg gentamicin  SURGEON:  Surgeon(s): Nadara Mustard, MD  ANESTHESIA:   regional and general  EBL:  Minimal ML  SPECIMEN:  No Specimen  TOURNIQUET:  * Missing tourniquet times found for documented tourniquets in log:  [id number] *  PROCEDURE DETAILS: Patient is a 77 year old woman with diabetic neuropathy status post fifth ray amputation who presents at this time with Charcot collapse of her ankle with a cavus collapse now walking on the lateral side of her foot do to the ankle tibial talar collapse. Patient presents at this time for pain talar tibial calcaneal fusion to get her on a plantigrade weightbearing foot. Risks and benefits were discussed including infection neurovascular injury nonhealing of the bone need for additional surgery risk for DVT. Patient states she understands and wished to proceed at this time. Description of procedure patient brought to the operating room after undergoing a popliteal block she then underwent a general anesthetic. After adequate levels of anesthesia were obtained patient was placed in the right lateral decubitus position with the left lower extremity up and the left lower extremity was prepped using DuraPrep draped in a sterile field Ioban was used to cover all exposed skin. A lateral incision was made over the fibula and the distal fibula was excised. 2 parallel cuts were made perpendicular to the long axis of the tibia to remove the articular cartilage from the distal tibia and the talar dome. The foot was placed in neutral varus valgus and 90 of dorsiflexion. A guidewire was then placed in the calcaneus into the tibia this was overreamed to 12 mm for the  11 mm nail. The nail was inserted a static screw was then placed in the talus lateral to medial. A proximal screw was then placed in the tibia 26 mm in length and the talar and calcaneus screw were then compressed dynamically with the compression screw. A third screw was then placed posterior to anterior 70 mm in length into the calcaneus. C-arm fluoroscopy verified reduction in both AP and lateral planes. The wounds were irrigated with normal saline antibiotic beads were placed the skin was closed using 2-0 nylon the wound is covered Adaptic orthopedic sponges AB dressing Kerlix and Coban. Patient was extubated taken to the PACU in stable condition.  PLAN OF CARE: Admit to inpatient   PATIENT DISPOSITION:  PACU - hemodynamically stable.   Nadara Mustard, MD 09/27/2012 12:28 PM

## 2012-09-27 NOTE — Progress Notes (Signed)
ANTICOAGULATION CONSULT NOTE - Initial Consult  Pharmacy Consult for Coumadin Indication: VTE prophylaxis  Allergies  Allergen Reactions  . Cymbalta (Duloxetine Hcl) Swelling    Swelling in legs  . Gabapentin Swelling    Swelling in legs  . Adhesive (Tape)     rash  . Cefazolin Hives  . Clorazepate Dipotassium   . Darifenacin Hydrobromide     REACTION: hypotension, near syncope  . Dilaudid (Hydromorphone Hcl)     REACTION: confused, intense itching  . Doxycycline     REACTION: RASH  . Enablex (Darifenacin Hydrobromide Er) Other (See Comments)    Reaction unknown  . Methadone Hcl Other (See Comments)    Reaction unknown  . Pentazocine Lactate     Altered mental state    Patient Measurements:   Vital Signs: Temp: 97.4 F (36.3 C) (12/06 1315) Temp src: Oral (12/06 0845) BP: 118/55 mmHg (12/06 1315) Pulse Rate: 73  (12/06 1315)  Labs:  Basename 09/25/12 1544  HGB 10.9*  HCT 34.5*  PLT 209  APTT 29  LABPROT 12.9  INR 0.98  HEPARINUNFRC --  CREATININE 0.95  CKTOTAL --  CKMB --  TROPONINI --    The CrCl is unknown because both a height and weight (above a minimum accepted value) are required for this calculation.   Medical History: Past Medical History  Diagnosis Date  . Hyperlipidemia   . Asthma   . Osteoporosis   . DVT (deep venous thrombosis) 12/2007    PE DVT after neck surgery, coumadin d/c 10-2008  . PE (pulmonary embolism) 12/2007  . Hiatal hernia     s/p repair   . Pulmonary nodule     incidental per CT:  Pet scan 4-9: likely benign, CT 08-2009 no change, no further CTs (Dr. Shelle Iron)  . Hemorrhoids   . IBS (irritable bowel syndrome)   . Colon polyp 1994    malignant  . Diverticulosis   . PPD positive   . Macular degeneration 03/2009    Dr. Earlene Plater  . Allergic rhinitis   . Dyspnea     chronic, extensive w/u see OV note 09-2010  . RSD (reflex sympathetic dystrophy)   . Hx of colonoscopy   . DJD (degenerative joint disease)   . Spinal  stenosis   . Complication of anesthesia   . PONV (postoperative nausea and vomiting)   . GERD (gastroesophageal reflux disease)   . Syncope     seen Dr. Gala Romney  . Hypertension     sees Dr. Drue Novel, primary  . Diabetes mellitus     not on medications, controls with diet  . Chronic urinary tract infection   . Headache     migraines when younger  . Neuropathy     "has in hands and feet and legs"    Medications:  Scheduled:    . clindamycin (CLEOCIN) IV  600 mg Intravenous Q6H  . [COMPLETED] clindamycin (CLEOCIN) IV  900 mg Intravenous 60 min Pre-Op  . [COMPLETED] fentaNYL  100 mcg Intravenous Once  . [COMPLETED] midazolam  0.5 mg Intravenous Once  . pantoprazole  40 mg Oral Daily    Assessment: 77 yr old female on coumadin for VTE prophlyaxis s/p ankle fusion.   Goal of Therapy:  INR 2-3 Monitor platelets by anticoagulation protocol: Yes   Plan:  Coumadin 5mg  po x 1 dose tonight. Daily PT/INR.  [name], PharmD, BCPS Clinical Pharmacist  Pager: [phone]

## 2012-09-27 NOTE — Anesthesia Postprocedure Evaluation (Signed)
Anesthesia Post Note  Patient: [name]  Procedure(s) Performed: Procedure(s) (LRB): ANKLE FUSION (Left)  Anesthesia type: general  Patient location: PACU  Post pain: Pain level controlled  Post assessment: Patient's Cardiovascular Status Stable  Last Vitals:  Filed Vitals:   09/27/12 1315  BP: 118/55  Pulse: 73  Temp: 36.3 C  Resp: 14    Post vital signs: Reviewed and stable  Level of consciousness: sedated  Complications: No apparent anesthesia complications

## 2012-09-27 NOTE — Preoperative (Signed)
Beta Blockers   Reason not to administer Beta Blockers:Not Applicable 

## 2012-09-27 NOTE — Progress Notes (Signed)
Patient ID: [name], female   DOB: Jun 15, 1935, 77 y.o.   MRN: [id number] Patient wants to return to [facility]  sk[facility] nursing facility. Most likely discharge to skilled nursing on Monday or Tuesday.

## 2012-09-27 NOTE — H&P (Signed)
KERLINE TRAHAN is an 77 y.o. female.   Chief Complaint: Charcot collapse left ankle status post fifth ray amputation for ulceration. With cavovarus ankle deformity. HPI: Patient is a 77 year old woman with diabetes peripheral vascular disease status post fifth ray amputation who presents with progressive destructive Charcot changes of the ankle. She is now walking on the lateral border of her foot do to the ankle deformity.  Past Medical History  Diagnosis Date  . Hyperlipidemia   . Asthma   . Osteoporosis   . DVT (deep venous thrombosis) 12/2007    PE DVT after neck surgery, coumadin d/c 10-2008  . PE (pulmonary embolism) 12/2007  . Hiatal hernia     s/p repair   . Pulmonary nodule     incidental per CT:  Pet scan 4-9: likely benign, CT 08-2009 no change, no further CTs (Dr. Shelle Iron)  . Hemorrhoids   . IBS (irritable bowel syndrome)   . Colon polyp 1994    malignant  . Diverticulosis   . PPD positive   . Macular degeneration 03/2009    Dr. Earlene Plater  . Allergic rhinitis   . Dyspnea     chronic, extensive w/u see OV note 09-2010  . RSD (reflex sympathetic dystrophy)   . Hx of colonoscopy   . DJD (degenerative joint disease)   . Spinal stenosis   . Complication of anesthesia   . PONV (postoperative nausea and vomiting)   . GERD (gastroesophageal reflux disease)   . Syncope     seen Dr. Gala Romney  . Hypertension     sees Dr. Drue Novel, primary  . Diabetes mellitus     not on medications, controls with diet  . Chronic urinary tract infection   . Headache     migraines when younger  . Neuropathy     "has in hands and feet and legs"    Past Surgical History  Procedure Date  . Hiatal hernia repair 01/25/10    with Nissen Fundaplication  . Cholecystectomy   . Total abdominal hysterectomy w/ bilateral salpingoophorectomy   . Cataract extraction   . Rotator cuff repair 07/2007    Dr. Eulah Pont  . Cervical spine surgery 12/18/07    For OA, fusion. Dr. Ophelia Charter:  fu by a PE  . Toe amputation  08/2008    3rd right toe, Dr. Lajoyce Corners due to osteomyelitis  . Hand surgery 09/27/11    Left Hand  . Nasal septum surgery   . Vein ligation 1967  . Carpal tunnel 09/2011  . Amputation 03/06/2012    Procedure: AMPUTATION FOOT;  Surgeon: Nadara Mustard, MD;  Location: [facility] OR;  Service: Orthopedics;  Laterality: Left;  FIFTH RAY AMPUTATION   . Green filter      due to blood clots,     Family History  Problem Relation Age of Onset  . Migraines      headaches  . Hypertension    . Heart disease Mother     mitral valve replaced  . Rheum arthritis Mother   . Colon cancer Neg Hx    Social History:  reports that she has never smoked. She has never used smokeless tobacco. She reports that she does not drink alcohol or use illicit drugs.  Allergies:  Allergies  Allergen Reactions  . Cymbalta (Duloxetine Hcl) Swelling    Swelling in legs  . Gabapentin Swelling    Swelling in legs  . Adhesive (Tape)     rash  . Cefazolin Hives  . Clorazepate  Dipotassium   . Darifenacin Hydrobromide     REACTION: hypotension, near syncope  . Dilaudid (Hydromorphone Hcl)     REACTION: confused, intense itching  . Doxycycline     REACTION: RASH  . Enablex (Darifenacin Hydrobromide Er) Other (See Comments)    Reaction unknown  . Methadone Hcl Other (See Comments)    Reaction unknown  . Pentazocine Lactate     Altered mental state    Medications Prior to Admission  Medication Sig Dispense Refill  . Alpha-Lipoic Acid 100 MG CAPS Take 1 capsule by mouth daily.      Marland Kitchen amLODipine-benazepril (LOTREL) 5-10 MG per capsule Take 1 capsule by mouth daily.      . Cholecalciferol (VITAMIN D PO) Take 5,000 Units by mouth daily.      Marland Kitchen CINNAMON PO Take 1 tablet by mouth 2 (two) times daily.      . Hydrocodone-Acetaminophen 10-660 MG TABS take 1 tablet by mouth four times a day DO NOT TAKE MORE THAN 4 TABS PER DAY  120 each  2  . Krill Oil 300 MG CAPS Take 1 capsule by mouth daily.      Marland Kitchen L-Methylfolate-B6-B12  (VITACIRC-B PO) Take 1 tablet by mouth 2 (two) times daily.        . meclizine (ANTIVERT) 25 MG tablet Take 25 mg by mouth daily as needed. For dizziness      . Misc Natural Products (LUTEIN 20) CAPS Take 1 capsule by mouth daily.      . Multiple Vitamins-Minerals (ICAPS AREDS FORMULA PO) Take 1 capsule by mouth 2 (two) times daily.      . nabumetone (RELAFEN) 750 MG tablet Take 750 mg by mouth 2 (two) times daily.      Marland Kitchen omeprazole (PRILOSEC) 20 MG capsule Take 1 capsule (20 mg total) by mouth 2 (two) times daily.  60 capsule  2  . sulfamethoxazole-trimethoprim (BACTRIM DS,SEPTRA DS) 800-160 MG per tablet Take 1 tablet by mouth 2 (two) times daily.      Marland Kitchen trimethoprim (TRIMPEX) 100 MG tablet Take 100 mg by mouth 2 (two) times daily.        Results for orders placed during the hospital encounter of 09/27/12 (from the past 48 hour(s))  GLUCOSE, CAPILLARY     Status: Abnormal   Collection Time   09/27/12  8:49 AM      Component Value Range Comment   Glucose-Capillary 123 (*) 70 - 99 mg/dL    No results found.  Review of Systems  All other systems reviewed and are negative.    Blood pressure 124/76, pulse 80, temperature 97.9 F (36.6 C), temperature source Oral, resp. rate 18, SpO2 99.00%. Physical Exam  On examination she has a palpable dorsalis pedis pulse is no redness no cellulitis no signs of infection. Radiographs shows a complete destruction of the ankle joint with cavovarus deformity. Assessment/Plan Assessment: Charcot collapse left ankle with cavus deformity and weightbearing on the lateral column of her foot with new ulceration.  Plan. Will plan for tibial calcaneal fusion to get her on a plantigrade foot. Discussed that there is a risk that this may be coming from infection of the ankle joint. If the ankle joint is infected we would need to hold on the tibial calcaneal fusion otherwise plan for tibial calcaneal fusion risks and benefits included infection neurovascular injury  failure of fixation nonhealing of the wound need for additional surgery patient states he understands was to proceed at this time.  Karen Kinnard  V 09/27/2012, 11:02 AM

## 2012-09-28 LAB — PROTIME-INR
INR: 1.15 (ref 0.00–1.49)
Prothrombin Time: 14.5 seconds (ref 11.6–15.2)

## 2012-09-28 LAB — GLUCOSE, CAPILLARY
Glucose-Capillary: 148 mg/dL — ABNORMAL HIGH (ref 70–99)
Glucose-Capillary: 111 mg/dL — ABNORMAL HIGH (ref 70–99)
Glucose-Capillary: 135 mg/dL — ABNORMAL HIGH (ref 70–99)
Glucose-Capillary: 131 mg/dL — ABNORMAL HIGH (ref 70–99)

## 2012-09-28 MED ORDER — WARFARIN SODIUM 5 MG PO TABS
5.0000 mg | ORAL_TABLET | Freq: Once | ORAL | Status: AC
  Administered 2012-09-28: 5 mg via ORAL
  Filled 2012-09-28: qty 1

## 2012-09-28 NOTE — Evaluation (Signed)
Physical Therapy Evaluation Patient Details Name: [name] MRN: [id number] DOB: Jan 12, 1935 Today's Date: 09/28/2012 Time: 4098-1191 PT Time Calculation (min): 25 min  PT Assessment / Plan / Recommendation Clinical Impression  Pt 77 yo female s/p L ankle fusion tolerating mobilty well. Patient plans on going to [facility] post d/c if they have a bed available. Pt has cousin, Jola Babinski, who works on [employer] who suggested she might look into CIR as well. Patient went to [facility] back in the spring and did well. Patient to benefit from ST-SNF or CIR to maximize funtional return prior to d/c home.    PT Assessment  Patient needs continued PT services    Follow Up Recommendations  CIR;SNF;Supervision/Assistance - 24 hour    Does the patient have the potential to tolerate intense rehabilitation      Barriers to Discharge None      Equipment Recommendations  None recommended by PT    Recommendations for Other Services Rehab consult   Frequency Min 5X/week    Precautions / Restrictions Precautions Precautions: Fall Restrictions LLE Weight Bearing: Non weight bearing   Pertinent Vitals/Pain Minimal in L LE      Mobility  Bed Mobility Bed Mobility: Supine to Sit Supine to Sit: 5: Supervision;With rails;HOB elevated Transfers Transfers: Sit to Stand;Stand Pivot Transfers;Stand to Sit Sit to Stand: 4: Min assist;With upper extremity assist (up to RW) Stand to Sit: 4: Min guard;With upper extremity assist;To chair/3-in-1 Stand Pivot Transfers: 4: Min assist (with RW) Details for Transfer Assistance: v/c's for walker management Ambulation/Gait Ambulation/Gait Assistance: 4: Min guard Ambulation Distance (Feet): 10 Feet Assistive device: Rolling walker Ambulation/Gait Assistance Details: donned R shoe to improve R LE support, pt able to clear R foot better with shoe than with just sock Gait Pattern: Step-to pattern Gait velocity: slow General Gait Details: fatigue Stairs:  No    Shoulder Instructions     Exercises     PT Diagnosis: Difficulty walking  PT Problem List: Decreased balance;Decreased mobility;Decreased strength PT Treatment Interventions: Gait training;Therapeutic activities;Functional mobility training;Therapeutic exercise   PT Goals Acute Rehab PT Goals PT Goal Formulation: With patient Time For Goal Achievement: 10/05/12 Potential to Achieve Goals: Good Pt will go Supine/Side to Sit: Independently;with HOB 0 degrees PT Goal: Supine/Side to Sit - Progress: Goal set today Pt will go Sit to Stand: with modified independence;with upper extremity assist (up to RW) PT Goal: Sit to Stand - Progress: Goal set today Pt will Transfer Bed to Chair/Chair to Bed: with supervision (with RW.) PT Transfer Goal: Bed to Chair/Chair to Bed - Progress: Goal set today Pt will Ambulate: 16 - 50 feet;with supervision;with rolling walker (with L LE NWB) PT Goal: Ambulate - Progress: Goal set today  Visit Information  Last PT Received On: 09/28/12 Assistance Needed: +1    Subjective Data  Subjective: Pt received supine in bed with request to use [facility]   Prior Functioning  Home Living Lives With: Family Available Help at Discharge: Family;Available 24 hours/day (pt plans on going to rehab) Type of Home: House Additional Comments: pt trying to decide between [facility] vs CIR (if CIR option) Prior Function Level of Independence: Independent with assistive device(s) (RW) Vocation: Retired Musician: No difficulties Dominant Hand: Right    Cognition  Overall Cognitive Status: Appears within functional limits for tasks assessed/performed Arousal/Alertness: Awake/alert Orientation Level: Oriented X4 / Intact Behavior During Session: [facility] for tasks performed    Extremity/Trunk Assessment Right Upper Extremity Assessment RUE ROM/Strength/Tone: Within functional levels Left  Upper Extremity Assessment LUE ROM/Strength/Tone: Within  functional levels Right Lower Extremity Assessment RLE ROM/Strength/Tone: Within functional levels RLE Sensation: History of peripheral neuropathy;Deficits Left Lower Extremity Assessment LLE ROM/Strength/Tone: Deficits LLE ROM/Strength/Tone Deficits: minimal ankle ROM due to sx LLE Sensation: History of peripheral neuropathy Trunk Assessment Trunk Assessment: Normal   Balance    End of Session PT - End of Session Equipment Utilized During Treatment: Gait belt Activity Tolerance: Patient tolerated treatment well Patient left: in chair;with call bell/phone within reach Nurse Communication: Mobility status  GP     Marcene Brawn 09/28/2012, 1:29 PM  Lewis Shock, PT, DPT Pager #: [phone] Office #: [phone]

## 2012-09-28 NOTE — Progress Notes (Signed)
ANTICOAGULATION CONSULT NOTE - Initial Consult  Pharmacy Consult for Coumadin Indication: VTE prophylaxis  Allergies  Allergen Reactions  . Cymbalta (Duloxetine Hcl) Swelling    Swelling in legs  . Gabapentin Swelling    Swelling in legs  . Adhesive (Tape)     rash  . Cefazolin Hives  . Clorazepate Dipotassium   . Darifenacin Hydrobromide     REACTION: hypotension, near syncope  . Dilaudid (Hydromorphone Hcl)     REACTION: confused, intense itching  . Doxycycline     REACTION: RASH  . Enablex (Darifenacin Hydrobromide Er) Other (See Comments)    Reaction unknown  . Methadone Hcl Other (See Comments)    Reaction unknown  . Pentazocine Lactate     Altered mental state    Patient Measurements:   Vital Signs: Temp: 97.7 F (36.5 C) (12/07 0543) Temp src: Oral (12/06 2114) BP: 121/45 mmHg (12/07 0543) Pulse Rate: 88  (12/07 0543)  Labs:  Basename 09/28/12 0640 09/25/12 1544  HGB -- 10.9*  HCT -- 34.5*  PLT -- 209  APTT -- 29  LABPROT 14.5 12.9  INR 1.15 0.98  HEPARINUNFRC -- --  CREATININE -- 0.95  CKTOTAL -- --  CKMB -- --  TROPONINI -- --    Estimated Creatinine Clearance: 57.4 ml/min (by C-G formula based on Cr of 0.95).   Medical History: Past Medical History  Diagnosis Date  . Hyperlipidemia   . Asthma   . Osteoporosis   . DVT (deep venous thrombosis) 12/2007    PE DVT after neck surgery, coumadin d/c 10-2008  . PE (pulmonary embolism) 12/2007  . Hiatal hernia     s/p repair   . Pulmonary nodule     incidental per CT:  Pet scan 4-9: likely benign, CT 08-2009 no change, no further CTs (Dr. Shelle Iron)  . Hemorrhoids   . IBS (irritable bowel syndrome)   . Colon polyp 1994    malignant  . Diverticulosis   . PPD positive   . Macular degeneration 03/2009    Dr. Earlene Plater  . Allergic rhinitis   . Dyspnea     chronic, extensive w/u see OV note 09-2010  . RSD (reflex sympathetic dystrophy)   . Hx of colonoscopy   . DJD (degenerative joint disease)   .  Spinal stenosis   . Complication of anesthesia   . PONV (postoperative nausea and vomiting)   . GERD (gastroesophageal reflux disease)   . Syncope     seen Dr. Gala Romney  . Hypertension     sees Dr. Drue Novel, primary  . Diabetes mellitus     not on medications, controls with diet  . Chronic urinary tract infection   . Headache     migraines when younger  . Neuropathy     "has in hands and feet and legs"    Medications:  Scheduled:     . [COMPLETED] clindamycin (CLEOCIN) IV  600 mg Intravenous Q6H  . [COMPLETED] clindamycin (CLEOCIN) IV  900 mg Intravenous 60 min Pre-Op  . [COMPLETED] fentaNYL  100 mcg Intravenous Once  . [COMPLETED] midazolam  0.5 mg Intravenous Once  . pantoprazole  40 mg Oral Daily  . [COMPLETED] patient's guide to using coumadin book   Does not apply Once  . [COMPLETED] warfarin  5 mg Oral ONCE-1800  . Warfarin - Pharmacist Dosing Inpatient   Does not apply q1800    Assessment: 77 yr old female on coumadin for VTE prophlyaxis s/p ankle fusion.  INR=1.15, today is  2nd dose of coumadin.  No bleeding noted.    Goal of Therapy:  INR 2-3 Monitor platelets by anticoagulation protocol: Yes   Plan:  Repeat Coumadin 5mg  po x 1 dose tonight. Daily PT/INR.  Wendie Simmer, PharmD, BCPS Clinical Pharmacist  Pager: [phone]

## 2012-09-28 NOTE — Progress Notes (Signed)
Subjective: Pt stable - pain controlled   Objective: Vital signs in last 24 hours: Temp:  [97.4 F (36.3 C)-98.2 F (36.8 C)] 97.7 F (36.5 C) (12/07 0543) Pulse Rate:  [73-92] 88  (12/07 0543) Resp:  [14-20] 18  (12/07 0543) BP: (113-130)/(45-57) 121/45 mmHg (12/07 0543) SpO2:  [97 %-100 %] 97 % (12/07 0543) Weight:  [92.257 kg (203 lb 6.2 oz)] 92.257 kg (203 lb 6.2 oz) (12/06 2000)  Intake/Output from previous day: 12/06 0701 - 12/07 0700 In: 720 [P.O.:480; I.V.:240] Out: -  Intake/Output this shift:    Exam:  Sensation intact distally No cellulitis present  Labs:  [facility] 09/25/12 1544  HGB 10.9*    Basename 09/25/12 1544  WBC 7.6  RBC 3.75*  HCT 34.5*  PLT 209    Basename 09/25/12 1544  NA 137  K 5.0  CL 104  CO2 25  BUN 14  CREATININE 0.95  GLUCOSE 127*  CALCIUM 9.2    Basename 09/28/12 0640 09/25/12 1544  LABPT -- --  INR 1.15 0.98    Assessment/Plan: Pt stable - dressing ok reinforced - mobilize with PT   Antwain Caliendo SCOTT 09/28/2012, 9:31 AM

## 2012-09-29 LAB — GLUCOSE, CAPILLARY
Glucose-Capillary: 155 mg/dL — ABNORMAL HIGH (ref 70–99)
Glucose-Capillary: 131 mg/dL — ABNORMAL HIGH (ref 70–99)
Glucose-Capillary: 157 mg/dL — ABNORMAL HIGH (ref 70–99)
Glucose-Capillary: 160 mg/dL — ABNORMAL HIGH (ref 70–99)

## 2012-09-29 LAB — PROTIME-INR
INR: 1.13 (ref 0.00–1.49)
Prothrombin Time: 14.3 seconds (ref 11.6–15.2)

## 2012-09-29 MED ORDER — VITAMIN D3 25 MCG (1000 UNIT) PO TABS
5000.0000 [IU] | ORAL_TABLET | Freq: Every day | ORAL | Status: DC
  Administered 2012-09-29 – 2012-09-30 (×2): 5000 [IU] via ORAL
  Filled 2012-09-29 (×2): qty 5

## 2012-09-29 MED ORDER — BENAZEPRIL HCL 10 MG PO TABS
10.0000 mg | ORAL_TABLET | Freq: Every day | ORAL | Status: DC
  Administered 2012-09-29 – 2012-09-30 (×2): 10 mg via ORAL
  Filled 2012-09-29 (×2): qty 1

## 2012-09-29 MED ORDER — MAGNESIUM CITRATE PO SOLN
1.0000 | Freq: Once | ORAL | Status: AC
  Administered 2012-09-29: 1 via ORAL
  Filled 2012-09-29: qty 296

## 2012-09-29 MED ORDER — WARFARIN SODIUM 7.5 MG PO TABS
7.5000 mg | ORAL_TABLET | Freq: Once | ORAL | Status: AC
  Administered 2012-09-29: 7.5 mg via ORAL
  Filled 2012-09-29: qty 1

## 2012-09-29 MED ORDER — AMLODIPINE BESYLATE 5 MG PO TABS
5.0000 mg | ORAL_TABLET | Freq: Every day | ORAL | Status: DC
  Administered 2012-09-29 – 2012-09-30 (×2): 5 mg via ORAL
  Filled 2012-09-29 (×2): qty 1

## 2012-09-29 NOTE — Progress Notes (Signed)
ANTICOAGULATION CONSULT NOTE - Initial Consult  Pharmacy Consult for Coumadin Indication: VTE prophylaxis  Allergies  Allergen Reactions  . Cymbalta (Duloxetine Hcl) Swelling    Swelling in legs  . Gabapentin Swelling    Swelling in legs  . Adhesive (Tape)     rash  . Cefazolin Hives  . Clorazepate Dipotassium   . Darifenacin Hydrobromide     REACTION: hypotension, near syncope  . Dilaudid (Hydromorphone Hcl)     REACTION: confused, intense itching  . Doxycycline     REACTION: RASH  . Enablex (Darifenacin Hydrobromide Er) Other (See Comments)    Reaction unknown  . Methadone Hcl Other (See Comments)    Reaction unknown  . Pentazocine Lactate     Altered mental state    Patient Measurements:   Vital Signs: Temp: 98.9 F (37.2 C) (12/08 0627) BP: 126/57 mmHg (12/08 0627) Pulse Rate: 83  (12/08 0627)  Labs:  Basename 09/29/12 0445 09/28/12 0640  HGB -- --  HCT -- --  PLT -- --  APTT -- --  LABPROT 14.3 14.5  INR 1.13 1.15  HEPARINUNFRC -- --  CREATININE -- --  CKTOTAL -- --  CKMB -- --  TROPONINI -- --    Estimated Creatinine Clearance: 57.4 ml/min (by C-G formula based on Cr of 0.95).   Medical History: Past Medical History  Diagnosis Date  . Hyperlipidemia   . Asthma   . Osteoporosis   . DVT (deep venous thrombosis) 12/2007    PE DVT after neck surgery, coumadin d/c 10-2008  . PE (pulmonary embolism) 12/2007  . Hiatal hernia     s/p repair   . Pulmonary nodule     incidental per CT:  Pet scan 4-9: likely benign, CT 08-2009 no change, no further CTs (Dr. Shelle Iron)  . Hemorrhoids   . IBS (irritable bowel syndrome)   . Colon polyp 1994    malignant  . Diverticulosis   . PPD positive   . Macular degeneration 03/2009    Dr. Earlene Plater  . Allergic rhinitis   . Dyspnea     chronic, extensive w/u see OV note 09-2010  . RSD (reflex sympathetic dystrophy)   . Hx of colonoscopy   . DJD (degenerative joint disease)   . Spinal stenosis   . Complication of  anesthesia   . PONV (postoperative nausea and vomiting)   . GERD (gastroesophageal reflux disease)   . Syncope     seen Dr. Gala Romney  . Hypertension     sees Dr. Drue Novel, primary  . Diabetes mellitus     not on medications, controls with diet  . Chronic urinary tract infection   . Headache     migraines when younger  . Neuropathy     "has in hands and feet and legs"    Medications:  Scheduled:     . pantoprazole  40 mg Oral Daily  . [COMPLETED] warfarin  5 mg Oral ONCE-1800  . Warfarin - Pharmacist Dosing Inpatient   Does not apply q1800    Assessment: 77 yr old female on coumadin for VTE prophlyaxis s/p ankle fusion.  INR is subtherapeutic at 1.13, today is 3rd dose of coumadin, will increase dose today.   No bleeding noted.    Goal of Therapy:  INR 2-3 Monitor platelets by anticoagulation protocol: Yes   Plan:  Repeat Coumadin 7.5mg  po x 1 dose tonight. Daily PT/INR.  Wendie Simmer, PharmD, BCPS Clinical Pharmacist  Pager: [phone]

## 2012-09-29 NOTE — Clinical Social Work Note (Signed)
Clinical Social Work Department CLINICAL SOCIAL WORK PLACEMENT NOTE 09/29/2012  Patient:  [name], [name]  Account Number:  [id number] Admit date:  09/27/2012  Clinical Social Worker:  Skip Mayer  Date/time:  09/29/2012 02:30 PM  Clinical Social Work is seeking post-discharge placement for this patient at the following level of care:   SKILLED NURSING   (*CSW will update this form in Epic as items are completed)   09/29/2012  Patient/family provided with [facility] Department of Clinical Social Work's list of facilities offering this level of care within the geographic area requested by the patient (or if unable, by the patient's family).  09/29/2012  Patient/family informed of their freedom to choose among providers that offer the needed level of care, that participate in Medicare, Medicaid or managed care program needed by the patient, have an available bed and are willing to accept the patient.  09/29/2012  Patient/family informed of [facility]' ownership interest in [facility], as well as of the fact that they are under no obligation to receive care at this facility.  PASARR submitted to EDS on 09/29/2012 PASARR number received from EDS on 09/29/2012  FL2 transmitted to all facilities in geographic area requested by pt/family on  09/29/2012 FL2 transmitted to all facilities within larger geographic area on   Patient informed that his/her managed care company has contracts with or will negotiate with  certain facilities, including the following:     Patient/family informed of bed offers received:   Patient chooses bed at  Physician recommends and patient chooses bed at    Patient to be transferred to  on   Patient to be transferred to facility by   The following physician request were entered in Epic:   Additional Comments:

## 2012-09-29 NOTE — Clinical Social Work Note (Signed)
Clinical Social Work Department BRIEF PSYCHOSOCIAL ASSESSMENT 09/29/2012  Patient:  [name], [name]     Account Number:  [id number]     Admit date:  09/27/2012  Clinical Social Worker:  Skip Mayer  Date/Time:  09/29/2012 02:00 PM  Referred by:  Physician  Date Referred:  09/29/2012 Referred for  SNF Placement   Other Referral:   Interview type:  Patient Other interview type:    PSYCHOSOCIAL DATA Living Status:  FAMILY Admitted from facility:   Level of care:   Primary support name:  Angelique Blonder Primary support relationship to patient:  CHILD, ADULT Degree of support available:   Adequate, per pt    CURRENT CONCERNS Current Concerns  Post-Acute Placement   Other Concerns:    SOCIAL WORK ASSESSMENT / PLAN CSW met with pt re: possible SNF placement vs. CIR.  Pt reports recent stay at [facility] and prefers this facility again for rehab as it is close to where her family lives.  Will initiate SNF search in [facility].  Pt aware of possibility that [facility] may not have a bed available.  CSW completed FL2 and placed on chart for MD signature.  Weekday CSW to f/u with offers.   Assessment/plan status:  Information/Referral to [facility] Other assessment/ plan:   Information/referral to community resources:   SNF  PTAR    PATIENT'S/FAMILY'S RESPONSE TO PLAN OF CARE: Pt reports agreeable to ST SNF in order to increase strength and independence with mobility prior to return home.  Pt verbalized understanding of placement process and appreciation for CSW assist.        Dellie Burns, MSW, LCSWA [phone] (Weekends 8:00am-4:30pm)

## 2012-09-29 NOTE — Progress Notes (Signed)
Subjective: Pt stable - pain controlled - mobilizing well   Objective: Vital signs in last 24 hours: Temp:  [98.4 F (36.9 C)-98.9 F (37.2 C)] 98.9 F (37.2 C) (12/08 0627) Pulse Rate:  [77-83] 83  (12/08 0627) Resp:  [16-18] 16  (12/08 0627) BP: (112-126)/(49-63) 126/57 mmHg (12/08 0627) SpO2:  [96 %-100 %] 96 % (12/08 0627)  Intake/Output from previous day: 12/07 0701 - 12/08 0700 In: 1030 [P.O.:840; I.V.:190] Out: -  Intake/Output this shift:    Exam:  Sensation intact distally  Labs: No results found for this basename: HGB:5 in the last 72 hours No results found for this basename: WBC:2,RBC:2,HCT:2,PLT:2 in the last 72 hours No results found for this basename: NA:2,K:2,CL:2,CO2:2,BUN:2,CREATININE:2,GLUCOSE:2,CALCIUM:2 in the last 72 hours  Basename 09/29/12 0445 09/28/12 0640  LABPT -- --  INR 1.13 1.15    Assessment/Plan: Pt doing well - dc this week   [name] 09/29/2012, 10:28 AM

## 2012-09-30 DIAGNOSIS — N390 Urinary tract infection, site not specified: Secondary | ICD-10-CM | POA: Diagnosis not present

## 2012-09-30 DIAGNOSIS — M2180 Other specified acquired deformities of unspecified limb: Secondary | ICD-10-CM | POA: Diagnosis not present

## 2012-09-30 DIAGNOSIS — D649 Anemia, unspecified: Secondary | ICD-10-CM | POA: Diagnosis not present

## 2012-09-30 DIAGNOSIS — S82899A Other fracture of unspecified lower leg, initial encounter for closed fracture: Secondary | ICD-10-CM | POA: Diagnosis not present

## 2012-09-30 DIAGNOSIS — IMO0001 Reserved for inherently not codable concepts without codable children: Secondary | ICD-10-CM | POA: Diagnosis not present

## 2012-09-30 DIAGNOSIS — I10 Essential (primary) hypertension: Secondary | ICD-10-CM | POA: Diagnosis not present

## 2012-09-30 DIAGNOSIS — E785 Hyperlipidemia, unspecified: Secondary | ICD-10-CM | POA: Diagnosis not present

## 2012-09-30 DIAGNOSIS — R279 Unspecified lack of coordination: Secondary | ICD-10-CM | POA: Diagnosis not present

## 2012-09-30 DIAGNOSIS — E559 Vitamin D deficiency, unspecified: Secondary | ICD-10-CM | POA: Diagnosis not present

## 2012-09-30 DIAGNOSIS — E1149 Type 2 diabetes mellitus with other diabetic neurological complication: Secondary | ICD-10-CM | POA: Diagnosis not present

## 2012-09-30 DIAGNOSIS — K219 Gastro-esophageal reflux disease without esophagitis: Secondary | ICD-10-CM | POA: Diagnosis not present

## 2012-09-30 DIAGNOSIS — M79609 Pain in unspecified limb: Secondary | ICD-10-CM | POA: Diagnosis not present

## 2012-09-30 DIAGNOSIS — E039 Hypothyroidism, unspecified: Secondary | ICD-10-CM | POA: Diagnosis not present

## 2012-09-30 DIAGNOSIS — E119 Type 2 diabetes mellitus without complications: Secondary | ICD-10-CM | POA: Diagnosis not present

## 2012-09-30 DIAGNOSIS — M19079 Primary osteoarthritis, unspecified ankle and foot: Secondary | ICD-10-CM | POA: Diagnosis not present

## 2012-09-30 DIAGNOSIS — L97409 Non-pressure chronic ulcer of unspecified heel and midfoot with unspecified severity: Secondary | ICD-10-CM | POA: Diagnosis not present

## 2012-09-30 DIAGNOSIS — R262 Difficulty in walking, not elsewhere classified: Secondary | ICD-10-CM | POA: Diagnosis not present

## 2012-09-30 LAB — PROTIME-INR
INR: 1.46 (ref 0.00–1.49)
Prothrombin Time: 17.3 seconds — ABNORMAL HIGH (ref 11.6–15.2)

## 2012-09-30 LAB — GLUCOSE, CAPILLARY
Glucose-Capillary: 148 mg/dL — ABNORMAL HIGH (ref 70–99)
Glucose-Capillary: 140 mg/dL — ABNORMAL HIGH (ref 70–99)

## 2012-09-30 MED ORDER — WARFARIN SODIUM 5 MG PO TABS
5.0000 mg | ORAL_TABLET | Freq: Once | ORAL | Status: DC
  Filled 2012-09-30: qty 1

## 2012-09-30 MED ORDER — HYDROCODONE-ACETAMINOPHEN 5-500 MG PO TABS: 1.0000 | ORAL_TABLET | Freq: Four times a day (QID) | ORAL | Status: DC | PRN

## 2012-09-30 MED ORDER — OXYCODONE-ACETAMINOPHEN 5-325 MG PO TABS: 1.0000 | ORAL_TABLET | ORAL | Status: DC | PRN

## 2012-09-30 NOTE — Progress Notes (Signed)
Rehab Admissions Coordinator Note:  Patient was screened by Clois Dupes for appropriateness for an Inpatient Acute Rehab Consult.  At this time, we are recommending Skilled Nursing Facility for no bed immediately available on inpt rehab for this patient and noted MD has discharged pt today.  Clois Dupes, RN 09/30/2012, 9:18 AM  I can be reached at [phone].

## 2012-09-30 NOTE — Progress Notes (Signed)
ANTICOAGULATION CONSULT NOTE - Initial Consult  Pharmacy Consult for Coumadin Indication: VTE prophylaxis  Allergies  Allergen Reactions  . Cymbalta (Duloxetine Hcl) Swelling    Swelling in legs  . Gabapentin Swelling    Swelling in legs  . Adhesive (Tape)     rash  . Cefazolin Hives  . Clorazepate Dipotassium   . Darifenacin Hydrobromide     REACTION: hypotension, near syncope  . Dilaudid (Hydromorphone Hcl)     REACTION: confused, intense itching  . Doxycycline     REACTION: RASH  . Enablex (Darifenacin Hydrobromide Er) Other (See Comments)    Reaction unknown  . Methadone Hcl Other (See Comments)    Reaction unknown  . Pentazocine Lactate     Altered mental state    Patient Measurements:   Vital Signs: Temp: 98.1 F (36.7 C) (12/09 1341) Temp src: Axillary (12/09 0704) BP: 126/60 mmHg (12/09 1341) Pulse Rate: 86  (12/09 1341)  Labs:  Alvira Philips 09/30/12 0558 09/29/12 0445 09/28/12 0640  HGB -- -- --  HCT -- -- --  PLT -- -- --  APTT -- -- --  LABPROT 17.3* 14.3 14.5  INR 1.46 1.13 1.15  HEPARINUNFRC -- -- --  CREATININE -- -- --  CKTOTAL -- -- --  CKMB -- -- --  TROPONINI -- -- --    Estimated Creatinine Clearance: 57.4 ml/min (by C-G formula based on Cr of 0.95).   Medical History: Past Medical History  Diagnosis Date  . Hyperlipidemia   . Asthma   . Osteoporosis   . DVT (deep venous thrombosis) 12/2007    PE DVT after neck surgery, coumadin d/c 10-2008  . PE (pulmonary embolism) 12/2007  . Hiatal hernia     s/p repair   . Pulmonary nodule     incidental per CT:  Pet scan 4-9: likely benign, CT 08-2009 no change, no further CTs (Dr. Shelle Iron)  . Hemorrhoids   . IBS (irritable bowel syndrome)   . Colon polyp 1994    malignant  . Diverticulosis   . PPD positive   . Macular degeneration 03/2009    Dr. Earlene Plater  . Allergic rhinitis   . Dyspnea     chronic, extensive w/u see OV note 09-2010  . RSD (reflex sympathetic dystrophy)   . Hx of  colonoscopy   . DJD (degenerative joint disease)   . Spinal stenosis   . Complication of anesthesia   . PONV (postoperative nausea and vomiting)   . GERD (gastroesophageal reflux disease)   . Syncope     seen Dr. Gala Romney  . Hypertension     sees Dr. Drue Novel, primary  . Diabetes mellitus     not on medications, controls with diet  . Chronic urinary tract infection   . Headache     migraines when younger  . Neuropathy     "has in hands and feet and legs"    Medications:  Scheduled:     . amLODipine  5 mg Oral Daily  . benazepril  10 mg Oral Daily  . cholecalciferol  5,000 Units Oral Daily  . pantoprazole  40 mg Oral Daily  . [COMPLETED] warfarin  7.5 mg Oral ONCE-1800  . Warfarin - Pharmacist Dosing Inpatient   Does not apply q1800    Assessment: 77 yr old female on coumadin for VTE prophlyaxis s/p ankle fusion.  INR is trending up nicely. Noted plans for d/c to snf. No coumadin ordered for d/c.    Goal of Therapy:  INR 2-3 Monitor platelets by anticoagulation protocol: Yes   Plan:  Coumadin 5mg  po x 1 dose tonight. F/u PT/INR tomorrow   Thank you,  Brett Fairy, PharmD, BCPS 09/30/2012 1:48 PM

## 2012-09-30 NOTE — Progress Notes (Signed)
Physical Therapy Treatment Patient Details Name: [name] MRN: [id number] DOB: 1935/04/28 Today's Date: 09/30/2012 Time: 7829-5621 PT Time Calculation (min): 25 min  PT Assessment / Plan / Recommendation Comments on Treatment Session  Patient continues to make steady progress with mobility and ambulation. Patient awaiting SNF placement at this time.     Follow Up Recommendations  CIR;SNF;Supervision/Assistance - 24 hour     Does the patient have the potential to tolerate intense rehabilitation     Barriers to Discharge        Equipment Recommendations  None recommended by PT    Recommendations for Other Services    Frequency Min 5X/week   Plan Discharge plan remains appropriate;Frequency remains appropriate    Precautions / Restrictions Precautions Precautions: Fall Restrictions Weight Bearing Restrictions: Yes LLE Weight Bearing: Non weight bearing   Pertinent Vitals/Pain     Mobility  Bed Mobility Supine to Sit: 6: Modified independent (Device/Increase time) Transfers Sit to Stand: 4: Min guard;With upper extremity assist;From bed;From toilet Stand to Sit: 4: Min guard;With upper extremity assist;To toilet;To chair/3-in-1 Details for Transfer Assistance: Cues for sfae hand placement Ambulation/Gait Ambulation/Gait Assistance: 4: Min assist Ambulation Distance (Feet): 40 Feet Assistive device: Rolling walker Ambulation/Gait Assistance Details: A for balance as patient starting fatiquing. Cues for positioning within RW and safe management of RW.  Gait Pattern: Step-to pattern Gait velocity: decreased    Exercises     PT Diagnosis:    PT Problem List:   PT Treatment Interventions:     PT Goals Acute Rehab PT Goals PT Goal: Supine/Side to Sit - Progress: Progressing toward goal PT Goal: Sit to Stand - Progress: Progressing toward goal PT Transfer Goal: Bed to Chair/Chair to Bed - Progress: Progressing toward goal PT Goal: Ambulate - Progress: Progressing  toward goal  Visit Information  Last PT Received On: 09/30/12 Assistance Needed: +1    Subjective Data      Cognition  Overall Cognitive Status: Appears within functional limits for tasks assessed/performed Arousal/Alertness: Awake/alert Orientation Level: Appears intact for tasks assessed Behavior During Session: [facility] for tasks performed    Balance     End of Session PT - End of Session Equipment Utilized During Treatment: Gait belt Activity Tolerance: Patient tolerated treatment well Patient left: in chair;with call bell/phone within reach Nurse Communication: Mobility status   GP     Fredrich Birks 09/30/2012, 10:53 AM  09/30/2012 Fredrich Birks PTA [phone] pager [phone] office

## 2012-09-30 NOTE — Discharge Summary (Signed)
Physician Discharge Summary  Patient ID: [name] MRN: [id number] DOB/AGE: 02-28-1935 77 y.o.  Admit date: 09/27/2012 Discharge date: 09/30/2012  Admission Diagnoses: Charcot collapse left ankle  Discharge Diagnoses: Same Active Problems:  * No active hospital problems. *    Discharged Condition: stable  Hospital Course: Patient is a 77 year old woman with diabetic arthropathy she is status post a fifth ray amputation for previous infection. Patient has developed progressive Charcot arthropathy of the ankle with a cavus varus deformity and presents at this time for stabilization internal fixation. Patient's hospital course essentially remarkable she underwent tibiocalcaneal fusion. Postoperatively she progressed well patient will require assistance prior to discharge to home and will be discharged to skilled nursing.  Consults: None  Significant Diagnostic Studies: labs: Routine labs  Treatments: surgery: See operative note  Discharge Exam: Blood pressure 114/45, pulse 80, temperature 98.7 F (37.1 C), temperature source Oral, resp. rate 16, height 5\' 5"  (1.651 m), weight 92.257 kg (203 lb 6.2 oz), SpO2 98.00%. Incision/Wound: dressing clean dry and intact  Disposition: 03-Skilled Nursing Facility  Discharge Orders    Future Appointments: Provider: Department: Dept Phone: Center:   10/07/2012 9:00 AM Mary Plump, MD [facility] [phone] [facility]     Future Orders Please Complete By Expires   Diet - low sodium heart healthy      Call MD / Call 911      Comments:   If you experience chest pain or shortness of breath, CALL 911 and be transported to the hospital emergency room.  If you develope a fever above 101 F, pus (white drainage) or increased drainage or redness at the wound, or calf pain, call your surgeon's office.   Constipation Prevention      Comments:   Drink plenty of fluids.  Prune juice may be helpful.  You may use a stool  softener, such as Colace (over the counter) 100 mg twice a day.  Use MiraLax (over the counter) for constipation as needed.   Increase activity slowly as tolerated          Medication List     As of 09/30/2012  6:33 AM    TAKE these medications         Alpha-Lipoic Acid 100 MG Caps   Take 1 capsule by mouth daily.      amLODipine-benazepril 5-10 MG per capsule   Commonly known as: LOTREL   Take 1 capsule by mouth daily.      CINNAMON PO   Take 1 tablet by mouth 2 (two) times daily.      Hydrocodone-Acetaminophen 10-660 MG Tabs   take 1 tablet by mouth four times a day DO NOT TAKE MORE THAN 4 TABS PER DAY      HYDROcodone-acetaminophen 5-500 MG per tablet   Commonly known as: VICODIN   Take 1 tablet by mouth every 6 (six) hours as needed for pain.      ICAPS AREDS FORMULA PO   Take 1 capsule by mouth 2 (two) times daily.      Krill Oil 300 MG Caps   Take 1 capsule by mouth daily.      LUTEIN 20 Caps   Take 1 capsule by mouth daily.      meclizine 25 MG tablet   Commonly known as: ANTIVERT   Take 25 mg by mouth daily as needed. For dizziness      nabumetone 750 MG tablet   Commonly known as: RELAFEN   Take  750 mg by mouth 2 (two) times daily.      omeprazole 20 MG capsule   Commonly known as: PRILOSEC   Take 1 capsule (20 mg total) by mouth 2 (two) times daily.      oxyCODONE-acetaminophen 5-325 MG per tablet   Commonly known as: PERCOCET/ROXICET   Take 1 tablet by mouth every 4 (four) hours as needed for pain.      sulfamethoxazole-trimethoprim 800-160 MG per tablet   Commonly known as: BACTRIM DS,SEPTRA DS   Take 1 tablet by mouth 2 (two) times daily.      trimethoprim 100 MG tablet   Commonly known as: TRIMPEX   Take 100 mg by mouth 2 (two) times daily.      VITACIRC-B PO   Take 1 tablet by mouth 2 (two) times daily.      VITAMIN D PO   Take 5,000 Units by mouth daily.           Follow-up Information    Follow up with Mary Kemler V, MD. In 2  weeks.   Contact information:   [address] [place] [address] [phone]          Signed: Nadara Gould 09/30/2012, 6:33 AM

## 2012-09-30 NOTE — Progress Notes (Signed)
Patient ID: [name], female   DOB: Aug 19, 1935, 77 y.o.   MRN: [id number] Plan for discharge to skilled nursing facility when bed is available. Discharge summary dictated prescriptions on the chart.

## 2012-10-01 ENCOUNTER — Encounter (HOSPITAL_COMMUNITY): Payer: Self-pay | Admitting: Orthopedic Surgery

## 2012-10-01 DIAGNOSIS — E119 Type 2 diabetes mellitus without complications: Secondary | ICD-10-CM | POA: Diagnosis not present

## 2012-10-01 DIAGNOSIS — I10 Essential (primary) hypertension: Secondary | ICD-10-CM | POA: Diagnosis not present

## 2012-10-02 DIAGNOSIS — E119 Type 2 diabetes mellitus without complications: Secondary | ICD-10-CM | POA: Diagnosis not present

## 2012-10-02 DIAGNOSIS — I10 Essential (primary) hypertension: Secondary | ICD-10-CM | POA: Diagnosis not present

## 2012-10-02 DIAGNOSIS — N390 Urinary tract infection, site not specified: Secondary | ICD-10-CM | POA: Diagnosis not present

## 2012-10-04 DIAGNOSIS — E119 Type 2 diabetes mellitus without complications: Secondary | ICD-10-CM | POA: Diagnosis not present

## 2012-10-04 DIAGNOSIS — I10 Essential (primary) hypertension: Secondary | ICD-10-CM | POA: Diagnosis not present

## 2012-10-07 ENCOUNTER — Ambulatory Visit (INDEPENDENT_AMBULATORY_CARE_PROVIDER_SITE_OTHER): Payer: Medicare Other | Admitting: Internal Medicine

## 2012-10-07 DIAGNOSIS — M1990 Unspecified osteoarthritis, unspecified site: Secondary | ICD-10-CM

## 2012-10-08 DIAGNOSIS — M19079 Primary osteoarthritis, unspecified ankle and foot: Secondary | ICD-10-CM | POA: Diagnosis not present

## 2012-10-23 LAB — HM COLONOSCOPY

## 2012-10-24 DIAGNOSIS — E119 Type 2 diabetes mellitus without complications: Secondary | ICD-10-CM | POA: Diagnosis not present

## 2012-10-24 DIAGNOSIS — I10 Essential (primary) hypertension: Secondary | ICD-10-CM | POA: Diagnosis not present

## 2012-10-25 DIAGNOSIS — E119 Type 2 diabetes mellitus without complications: Secondary | ICD-10-CM | POA: Diagnosis not present

## 2012-10-25 DIAGNOSIS — S82899A Other fracture of unspecified lower leg, initial encounter for closed fracture: Secondary | ICD-10-CM | POA: Diagnosis not present

## 2012-10-25 DIAGNOSIS — D649 Anemia, unspecified: Secondary | ICD-10-CM | POA: Diagnosis not present

## 2012-10-27 NOTE — Progress Notes (Signed)
  Subjective:    Patient ID: [name], female    DOB: 15-Jan-1935, 78 y.o.   MRN: [id number]  HPI APPOINTMENT CANCELLED BY PATIENT    Review of Systems     Objective:   Physical Exam        Assessment & Plan:

## 2012-11-04 DIAGNOSIS — L97409 Non-pressure chronic ulcer of unspecified heel and midfoot with unspecified severity: Secondary | ICD-10-CM | POA: Diagnosis not present

## 2012-11-04 DIAGNOSIS — E1149 Type 2 diabetes mellitus with other diabetic neurological complication: Secondary | ICD-10-CM | POA: Diagnosis not present

## 2012-11-08 DIAGNOSIS — I10 Essential (primary) hypertension: Secondary | ICD-10-CM | POA: Diagnosis not present

## 2012-11-08 DIAGNOSIS — E119 Type 2 diabetes mellitus without complications: Secondary | ICD-10-CM | POA: Diagnosis not present

## 2012-11-15 DIAGNOSIS — I10 Essential (primary) hypertension: Secondary | ICD-10-CM | POA: Diagnosis not present

## 2012-11-15 DIAGNOSIS — E119 Type 2 diabetes mellitus without complications: Secondary | ICD-10-CM | POA: Diagnosis not present

## 2012-11-18 DIAGNOSIS — L97509 Non-pressure chronic ulcer of other part of unspecified foot with unspecified severity: Secondary | ICD-10-CM | POA: Diagnosis not present

## 2012-11-18 DIAGNOSIS — R269 Unspecified abnormalities of gait and mobility: Secondary | ICD-10-CM | POA: Diagnosis not present

## 2012-11-18 DIAGNOSIS — I10 Essential (primary) hypertension: Secondary | ICD-10-CM | POA: Diagnosis not present

## 2012-11-18 DIAGNOSIS — E1149 Type 2 diabetes mellitus with other diabetic neurological complication: Secondary | ICD-10-CM | POA: Diagnosis not present

## 2012-11-18 DIAGNOSIS — E1169 Type 2 diabetes mellitus with other specified complication: Secondary | ICD-10-CM | POA: Diagnosis not present

## 2012-11-19 DIAGNOSIS — I10 Essential (primary) hypertension: Secondary | ICD-10-CM | POA: Diagnosis not present

## 2012-11-19 DIAGNOSIS — E1169 Type 2 diabetes mellitus with other specified complication: Secondary | ICD-10-CM | POA: Diagnosis not present

## 2012-11-19 DIAGNOSIS — L97509 Non-pressure chronic ulcer of other part of unspecified foot with unspecified severity: Secondary | ICD-10-CM | POA: Diagnosis not present

## 2012-11-19 DIAGNOSIS — E1149 Type 2 diabetes mellitus with other diabetic neurological complication: Secondary | ICD-10-CM | POA: Diagnosis not present

## 2012-11-19 DIAGNOSIS — R269 Unspecified abnormalities of gait and mobility: Secondary | ICD-10-CM | POA: Diagnosis not present

## 2012-11-22 DIAGNOSIS — N390 Urinary tract infection, site not specified: Secondary | ICD-10-CM | POA: Diagnosis not present

## 2012-11-22 DIAGNOSIS — L97509 Non-pressure chronic ulcer of other part of unspecified foot with unspecified severity: Secondary | ICD-10-CM | POA: Diagnosis not present

## 2012-11-22 DIAGNOSIS — I10 Essential (primary) hypertension: Secondary | ICD-10-CM | POA: Diagnosis not present

## 2012-11-22 DIAGNOSIS — R269 Unspecified abnormalities of gait and mobility: Secondary | ICD-10-CM | POA: Diagnosis not present

## 2012-11-22 DIAGNOSIS — E1149 Type 2 diabetes mellitus with other diabetic neurological complication: Secondary | ICD-10-CM | POA: Diagnosis not present

## 2012-11-22 DIAGNOSIS — E1169 Type 2 diabetes mellitus with other specified complication: Secondary | ICD-10-CM | POA: Diagnosis not present

## 2012-11-26 DIAGNOSIS — E1169 Type 2 diabetes mellitus with other specified complication: Secondary | ICD-10-CM | POA: Diagnosis not present

## 2012-11-26 DIAGNOSIS — E1149 Type 2 diabetes mellitus with other diabetic neurological complication: Secondary | ICD-10-CM | POA: Diagnosis not present

## 2012-11-26 DIAGNOSIS — R269 Unspecified abnormalities of gait and mobility: Secondary | ICD-10-CM | POA: Diagnosis not present

## 2012-11-26 DIAGNOSIS — L97509 Non-pressure chronic ulcer of other part of unspecified foot with unspecified severity: Secondary | ICD-10-CM | POA: Diagnosis not present

## 2012-11-26 DIAGNOSIS — I10 Essential (primary) hypertension: Secondary | ICD-10-CM | POA: Diagnosis not present

## 2012-11-28 DIAGNOSIS — E1169 Type 2 diabetes mellitus with other specified complication: Secondary | ICD-10-CM | POA: Diagnosis not present

## 2012-11-28 DIAGNOSIS — I10 Essential (primary) hypertension: Secondary | ICD-10-CM | POA: Diagnosis not present

## 2012-11-28 DIAGNOSIS — L97509 Non-pressure chronic ulcer of other part of unspecified foot with unspecified severity: Secondary | ICD-10-CM | POA: Diagnosis not present

## 2012-11-28 DIAGNOSIS — R269 Unspecified abnormalities of gait and mobility: Secondary | ICD-10-CM | POA: Diagnosis not present

## 2012-11-28 DIAGNOSIS — E1149 Type 2 diabetes mellitus with other diabetic neurological complication: Secondary | ICD-10-CM | POA: Diagnosis not present

## 2012-12-02 DIAGNOSIS — L97919 Non-pressure chronic ulcer of unspecified part of right lower leg with unspecified severity: Secondary | ICD-10-CM | POA: Diagnosis not present

## 2012-12-02 DIAGNOSIS — L97409 Non-pressure chronic ulcer of unspecified heel and midfoot with unspecified severity: Secondary | ICD-10-CM | POA: Diagnosis not present

## 2012-12-02 DIAGNOSIS — E1149 Type 2 diabetes mellitus with other diabetic neurological complication: Secondary | ICD-10-CM | POA: Diagnosis not present

## 2012-12-02 DIAGNOSIS — I83219 Varicose veins of right lower extremity with both ulcer of unspecified site and inflammation: Secondary | ICD-10-CM | POA: Diagnosis not present

## 2012-12-03 DIAGNOSIS — L97509 Non-pressure chronic ulcer of other part of unspecified foot with unspecified severity: Secondary | ICD-10-CM | POA: Diagnosis not present

## 2012-12-03 DIAGNOSIS — E1169 Type 2 diabetes mellitus with other specified complication: Secondary | ICD-10-CM | POA: Diagnosis not present

## 2012-12-03 DIAGNOSIS — R269 Unspecified abnormalities of gait and mobility: Secondary | ICD-10-CM | POA: Diagnosis not present

## 2012-12-03 DIAGNOSIS — I10 Essential (primary) hypertension: Secondary | ICD-10-CM | POA: Diagnosis not present

## 2012-12-03 DIAGNOSIS — E1149 Type 2 diabetes mellitus with other diabetic neurological complication: Secondary | ICD-10-CM | POA: Diagnosis not present

## 2012-12-04 ENCOUNTER — Encounter: Payer: Self-pay | Admitting: Internal Medicine

## 2012-12-04 ENCOUNTER — Encounter: Payer: Self-pay | Admitting: Neurology

## 2012-12-06 ENCOUNTER — Encounter: Payer: Self-pay | Admitting: Neurology

## 2012-12-11 DIAGNOSIS — E1149 Type 2 diabetes mellitus with other diabetic neurological complication: Secondary | ICD-10-CM | POA: Diagnosis not present

## 2012-12-11 DIAGNOSIS — R269 Unspecified abnormalities of gait and mobility: Secondary | ICD-10-CM | POA: Diagnosis not present

## 2012-12-11 DIAGNOSIS — L97509 Non-pressure chronic ulcer of other part of unspecified foot with unspecified severity: Secondary | ICD-10-CM | POA: Diagnosis not present

## 2012-12-11 DIAGNOSIS — E1169 Type 2 diabetes mellitus with other specified complication: Secondary | ICD-10-CM | POA: Diagnosis not present

## 2012-12-11 DIAGNOSIS — I10 Essential (primary) hypertension: Secondary | ICD-10-CM | POA: Diagnosis not present

## 2012-12-12 DIAGNOSIS — E1169 Type 2 diabetes mellitus with other specified complication: Secondary | ICD-10-CM | POA: Diagnosis not present

## 2012-12-12 DIAGNOSIS — L97509 Non-pressure chronic ulcer of other part of unspecified foot with unspecified severity: Secondary | ICD-10-CM | POA: Diagnosis not present

## 2012-12-12 DIAGNOSIS — I10 Essential (primary) hypertension: Secondary | ICD-10-CM | POA: Diagnosis not present

## 2012-12-12 DIAGNOSIS — E1149 Type 2 diabetes mellitus with other diabetic neurological complication: Secondary | ICD-10-CM | POA: Diagnosis not present

## 2012-12-12 DIAGNOSIS — R269 Unspecified abnormalities of gait and mobility: Secondary | ICD-10-CM | POA: Diagnosis not present

## 2012-12-17 DIAGNOSIS — E1169 Type 2 diabetes mellitus with other specified complication: Secondary | ICD-10-CM | POA: Diagnosis not present

## 2012-12-17 DIAGNOSIS — I10 Essential (primary) hypertension: Secondary | ICD-10-CM | POA: Diagnosis not present

## 2012-12-17 DIAGNOSIS — L97509 Non-pressure chronic ulcer of other part of unspecified foot with unspecified severity: Secondary | ICD-10-CM | POA: Diagnosis not present

## 2012-12-17 DIAGNOSIS — E1149 Type 2 diabetes mellitus with other diabetic neurological complication: Secondary | ICD-10-CM | POA: Diagnosis not present

## 2012-12-17 DIAGNOSIS — R269 Unspecified abnormalities of gait and mobility: Secondary | ICD-10-CM | POA: Diagnosis not present

## 2012-12-19 ENCOUNTER — Other Ambulatory Visit: Payer: Self-pay | Admitting: Internal Medicine

## 2012-12-19 NOTE — Telephone Encounter (Signed)
Refill done.  

## 2012-12-30 ENCOUNTER — Other Ambulatory Visit: Payer: Self-pay | Admitting: *Deleted

## 2012-12-30 MED ORDER — OMEPRAZOLE 20 MG PO CPDR: 20.0000 mg | DELAYED_RELEASE_CAPSULE | Freq: Two times a day (BID) | ORAL | Status: DC

## 2013-01-10 ENCOUNTER — Ambulatory Visit: Payer: Self-pay | Admitting: Neurology

## 2013-01-10 DIAGNOSIS — I83219 Varicose veins of right lower extremity with both ulcer of unspecified site and inflammation: Secondary | ICD-10-CM | POA: Diagnosis not present

## 2013-01-10 DIAGNOSIS — E1149 Type 2 diabetes mellitus with other diabetic neurological complication: Secondary | ICD-10-CM | POA: Diagnosis not present

## 2013-01-10 DIAGNOSIS — L97929 Non-pressure chronic ulcer of unspecified part of left lower leg with unspecified severity: Secondary | ICD-10-CM | POA: Diagnosis not present

## 2013-01-10 DIAGNOSIS — L97409 Non-pressure chronic ulcer of unspecified heel and midfoot with unspecified severity: Secondary | ICD-10-CM | POA: Diagnosis not present

## 2013-01-14 ENCOUNTER — Ambulatory Visit: Payer: Self-pay | Admitting: Neurology

## 2013-01-30 ENCOUNTER — Encounter: Payer: Self-pay | Admitting: Lab

## 2013-01-31 ENCOUNTER — Encounter: Payer: Self-pay | Admitting: Internal Medicine

## 2013-01-31 ENCOUNTER — Ambulatory Visit (INDEPENDENT_AMBULATORY_CARE_PROVIDER_SITE_OTHER): Payer: Medicare Other | Admitting: Internal Medicine

## 2013-01-31 VITALS — BP 136/82 | HR 84 | Temp 98.1°F | Wt 199.0 lb

## 2013-01-31 DIAGNOSIS — R609 Edema, unspecified: Secondary | ICD-10-CM | POA: Diagnosis not present

## 2013-01-31 DIAGNOSIS — R600 Localized edema: Secondary | ICD-10-CM

## 2013-01-31 DIAGNOSIS — G9050 Complex regional pain syndrome I, unspecified: Secondary | ICD-10-CM | POA: Diagnosis not present

## 2013-01-31 DIAGNOSIS — E119 Type 2 diabetes mellitus without complications: Secondary | ICD-10-CM

## 2013-01-31 DIAGNOSIS — M1990 Unspecified osteoarthritis, unspecified site: Secondary | ICD-10-CM

## 2013-01-31 DIAGNOSIS — I10 Essential (primary) hypertension: Secondary | ICD-10-CM

## 2013-01-31 LAB — CBC WITH DIFFERENTIAL/PLATELET
Basophils Absolute: 0 10*3/uL (ref 0.0–0.1)
Basophils Relative: 0.7 % (ref 0.0–3.0)
Eosinophils Absolute: 0.3 10*3/uL (ref 0.0–0.7)
Eosinophils Relative: 6.6 % — ABNORMAL HIGH (ref 0.0–5.0)
HCT: 36.4 % (ref 36.0–46.0)
Hemoglobin: 12 g/dL (ref 12.0–15.0)
Lymphocytes Relative: 42.7 % (ref 12.0–46.0)
Lymphs Abs: 1.8 10*3/uL (ref 0.7–4.0)
MCHC: 33 g/dL (ref 30.0–36.0)
MCV: 87.6 fl (ref 78.0–100.0)
Monocytes Absolute: 0.3 10*3/uL (ref 0.1–1.0)
Monocytes Relative: 7.9 % (ref 3.0–12.0)
Neutro Abs: 1.8 10*3/uL (ref 1.4–7.7)
Neutrophils Relative %: 42.1 % — ABNORMAL LOW (ref 43.0–77.0)
Platelets: 284 10*3/uL (ref 150.0–400.0)
RBC: 4.15 Mil/uL (ref 3.87–5.11)
RDW: 14.4 % (ref 11.5–14.6)
WBC: 4.3 10*3/uL — ABNORMAL LOW (ref 4.5–10.5)

## 2013-01-31 LAB — BASIC METABOLIC PANEL
BUN: 29 mg/dL — ABNORMAL HIGH (ref 6–23)
CO2: 22 mEq/L (ref 19–32)
Calcium: 9.2 mg/dL (ref 8.4–10.5)
Chloride: 104 mEq/L (ref 96–112)
Creatinine, Ser: 1.1 mg/dL (ref 0.4–1.2)
GFR: 53.62 mL/min — ABNORMAL LOW (ref >60.00)
Glucose, Bld: 119 mg/dL — ABNORMAL HIGH (ref 70–99)
Potassium: 4.8 mEq/L (ref 3.5–5.1)
Sodium: 134 mEq/L — ABNORMAL LOW (ref 135–145)

## 2013-01-31 LAB — HEMOGLOBIN A1C: Hgb A1c MFr Bld: 5.9 % (ref 4.6–6.5)

## 2013-01-31 MED ORDER — HYDROCODONE-ACETAMINOPHEN 10-325 MG PO TABS: 1.0000 | ORAL_TABLET | Freq: Four times a day (QID) | ORAL | Status: DC | PRN

## 2013-01-31 NOTE — Progress Notes (Signed)
Subjective:    Patient ID: [name], female    DOB: 16-Sep-1935, 78 y.o.   MRN: [id number]  HPI Routine office visit DJD, since the last time she was here, had surgery of her ankle.  See assessment and plan. Hypertension, good medication compliance, ambulatory BPs always within normal. GERD, well-controlled on Prilosec. Diabetes, diet controlled, CBGs usually around 117. RSD multiple intolerances to medications, tried Lyrica which helped but caused swelling, had to d/c it, swelling gone.  Past Medical History: Hypertension Hyperlipidemia Diabetes PE and DVT 12-2007, after neck surgery, CT chest 10-09 neg for PE , coumadin d/c 10-2008 CP 06-2008: admited, r/o for MI, ECHO normal; symptoms likely GI stress test abnormal 2007, but  normal 03-2009 GI PROBLEMS Large hiatal hernia repair 01/25/10 h/o IBS Hemorrhoids HX of Malignant Colon Polyp in 1994, colonoscopy again 8-10, had a biopsy. Next 2015 --------------------------------------------- ?asthma (SOB when attempted  to d/c singulair 11-2008) Pulmonary Nodule , incidental per CT: PET scan 4-9: likely  benign, CT 08-2009 no change, no further CTs( Dr Shelle Iron) REFLEX SYMPATHETIC DYSTROPHY  and Neuropathy, sees Dr Sandria Manly DJD--chronic ankle pain, orthopedic surgeon Dr.Duda Osteopenia h/ o +PPD early macular degeneration June 2010 (Dr Earlene Plater) Orthostatic intolerance Allergic rhinitis  Past Surgical History  Procedure Laterality Date  . Hiatal hernia repair  01/25/10    with Nissen Fundaplication  . Cholecystectomy    . Total abdominal hysterectomy w/ bilateral salpingoophorectomy    . Cataract extraction    . Rotator cuff repair  07/2007    Dr. Eulah Pont  . Cervical spine surgery  12/18/07    For OA, fusion. Dr. Ophelia Charter:  fu by a PE  . Toe amputation  08/2008    3rd right toe, Dr. Lajoyce Corners due to osteomyelitis  . Hand surgery  09/27/11    Left Hand  . Nasal septum surgery    . Vein ligation  1967  . Carpal tunnel  09/2011  . Amputation   03/06/2012    Procedure: AMPUTATION FOOT;  Surgeon: Nadara Mustard, MD;  Location: [facility] OR;  Service: Orthopedics;  Laterality: Left;  FIFTH RAY AMPUTATION   . Green filter       due to blood clots,   . Ankle fusion  09/27/2012    left ankle  . Ankle fusion  09/27/2012    Procedure: ANKLE FUSION;  Surgeon: Nadara Mustard, MD;  Location: [facility] OR;  Service: Orthopedics;  Laterality: Left;  Left Tibiocalcaneal Fusion   History   Social History  . Marital Status: Divorced    Spouse Name: N/A    Number of Children: 3  . Years of Education: N/A   Occupational History  . Retired      from Engineer, site   . Retired from Engineer, site    Social History Main Topics  . Smoking status: Never Smoker   . Smokeless tobacco: Never Used  . Alcohol Use: No  . Drug Use: No  . Sexually Active: No   Other Topics Concern  . Not on file   Social History Narrative   1 daughter lives w/ her , another daughter lives in town, 1 son in [place]              Family History CAD--- no DM-- daughter, type II Stroke--no Colon ca-- GM, age of onset? Breast ca--no   Review of Systems Denies chest pain or palpitations. Occasional dyspnea on exertion, symptoms are not consistent every time she walks. Denies cough, wheezing. No orthopnea.  No nausea, vomiting, diarrhea    Objective:   Physical Exam  BP 136/82  Pulse 84  Temp(Src) 98.1 F (36.7 C) (Oral)  Wt 199 lb (90.266 kg)  BMI 33.12 kg/m2  SpO2 98% General -- alert, well-developed, no apparent distress.    Lungs -- normal respiratory effort, no intercostal retractions, no accessory muscle use, and normal breath sounds.   Heart-- normal rate, regular rhythm, no murmur, and no gallop.   Extremities-- +/+++ pretibial edema bilaterally Neurologic-- alert & oriented X3 and strength normal in all extremities. Psych-- Cognition and judgment appear intact. Alert and cooperative with normal attention span and concentration.  not anxious appearing and  not depressed appearing.       Assessment & Plan:

## 2013-01-31 NOTE — Assessment & Plan Note (Signed)
Used to see Dr. Sandria Manly, will meet her new neurologist soon. Intolerant to Cymbalta , Neurontin due to increased lower extremity edema. Tried Lyrica, it worked but again caused swelling. Will refill hydrocodone, encouraged to see her new neurologist

## 2013-01-31 NOTE — Assessment & Plan Note (Signed)
Under excellent control per last A1c, labs

## 2013-01-31 NOTE — Assessment & Plan Note (Signed)
Exacerbated by RSD medication. Currently at baseline.

## 2013-01-31 NOTE — Assessment & Plan Note (Signed)
Status post ankle surgery 09/2012, was discharged to a rehab facility, then went to live with her son, back home x 4  weeks.

## 2013-01-31 NOTE — Assessment & Plan Note (Signed)
We'll control, labs

## 2013-02-03 ENCOUNTER — Encounter: Payer: Self-pay | Admitting: *Deleted

## 2013-02-04 DIAGNOSIS — Z79899 Other long term (current) drug therapy: Secondary | ICD-10-CM | POA: Diagnosis not present

## 2013-02-06 DIAGNOSIS — L97409 Non-pressure chronic ulcer of unspecified heel and midfoot with unspecified severity: Secondary | ICD-10-CM | POA: Diagnosis not present

## 2013-02-14 DIAGNOSIS — M545 Low back pain, unspecified: Secondary | ICD-10-CM | POA: Diagnosis not present

## 2013-02-14 DIAGNOSIS — M25559 Pain in unspecified hip: Secondary | ICD-10-CM | POA: Diagnosis not present

## 2013-02-19 ENCOUNTER — Encounter: Payer: Self-pay | Admitting: Neurology

## 2013-02-19 ENCOUNTER — Ambulatory Visit (INDEPENDENT_AMBULATORY_CARE_PROVIDER_SITE_OTHER): Payer: Medicare Other | Admitting: Neurology

## 2013-02-19 VITALS — BP 137/81 | Ht 64.0 in | Wt 200.0 lb

## 2013-02-19 DIAGNOSIS — Z9889 Other specified postprocedural states: Secondary | ICD-10-CM

## 2013-02-19 DIAGNOSIS — E0590 Thyrotoxicosis, unspecified without thyrotoxic crisis or storm: Secondary | ICD-10-CM

## 2013-02-19 DIAGNOSIS — G589 Mononeuropathy, unspecified: Secondary | ICD-10-CM

## 2013-02-19 DIAGNOSIS — K219 Gastro-esophageal reflux disease without esophagitis: Secondary | ICD-10-CM

## 2013-02-19 DIAGNOSIS — G629 Polyneuropathy, unspecified: Secondary | ICD-10-CM | POA: Insufficient documentation

## 2013-02-19 DIAGNOSIS — R112 Nausea with vomiting, unspecified: Secondary | ICD-10-CM

## 2013-02-19 DIAGNOSIS — R51 Headache: Secondary | ICD-10-CM

## 2013-02-19 DIAGNOSIS — R55 Syncope and collapse: Secondary | ICD-10-CM

## 2013-02-19 NOTE — Progress Notes (Signed)
HPI: 78 year old right handed white female initially evaluated for a possible neuromuscular cause for her dyspnea. She previously underwent extensive cardiopulmonary evaluation as well, as documented  09/05/2010, and was referred for evaluation for a possible neuromuscular cause.   All studies (including various blood work, including antibody panel for Myasthenic conditions, as well as EMG/NCVS, etc) were negative for any neuromuscular cause for her dyspnea. She went  to pulmonary rehab and her symptoms improved.   She has recurrent breathing problems and shortness of breath. She has discomfort from her peripheral neuropathy in her lower extremities bilaterally. She has had peripheral neuropathy as documented by EMG/NCVS since the 1990s and  known diabetes for approximately 4-1/2 years.   She also has a poorly-healed and now chronically-problematic left ankle injury that contributes to her pain, as well as difficulty ambulating. Approximately 4-1/2 years ago she developed an acquired deformity of her right foot resulting in a loss of the arch of her foot. This resulted in disproportionate pressure being put on her right foot, which unfortunately resulted in the development of a pressure ulcer about 2-1/2 years ago. This has been slow to heal, and has further contributed to her pain and difficulty ambulating.  She' has been evaluated by different podiatrists, all of whom have recommended conservative treatment.  Her right lower extremity was put in a full leg cast with foot bandaging in attempts to alleviate some of the pressure on the foot to allow the ulcer to heal. With respect to her peripheral neuropathy, she reports being in very little pain,somewhat worse at night.   The pain initially started in her feet but has ascended into her thighs bilaterally. She denies any bowel or bladder incontinence, but admits to difficulty walking, which has been long-standing and complicated by her aforementioned other  maladies of the lower extremities, and for which she uses a single-point cane.  She also admits to decreased sensation in her lower extremities bilaterally.  She went on Metnax, generic form with improvement in pain.  She has been on Elavil in the past and questions whether she should restart that.  She being followed by Kathe Mariner and Doctor Julio Alm.   She previously had hiatal surgery in 2011.  She has nonradiating back pain which does not change  with bending over, cough, or sneeze.  It  can however be like a "knife stabbing me in the back".She fell 10/2012i nitial x-rays unremarkable Dr. Charlett Blake has recently  indicated lumbar spine x-rays show a healed fracture. The patient cannot  take ibuprofen because of ulcers.    She has carpal tunnel syndrome and  is being followed by Dr. Lajoyce Corners who performed surgery to her left hand 09/26/11.  She is considering surgery to her right hand. She has ulcerations on her right and left foot and was admitted urgently 03/06/2012 fo rleft small toe removal of osteomyelitis. She resembling involves for 4 weeks after.   She has pain in her left heel and ankle increased with walking. She can dress herself and bathe herself. She does not drive  or shop . She does laundry, cleans in a limited fashion,makes the bed and cooks. Her capillary blood glucoses run 112 -99.    UPDATE April 30th 2014, she could not tolerate cymbalta, left leg swelling, could not tolerate lyrica, neurontin due to lower extremity swelling. It does help her symptoms some.  she wear left ankel brace, she will been by spine physcian Dr. Noel Gerold about possible lumbar decompressoin surgery.  She has bulging disc, She also complains of diffuse body achy pain, multiple joints pain.  She had multiple left ankle surgery in 2013, she now has rod in her left ankle foot,   Since the prior study the patient has developed a slight compression fracture of the superior endplate of L1. There is no protrusion  of bone or disc material into  the spinal canal. The scan demonstrates slight diffuse bulges of the discs from and T9-10 through L2-3 with no neural impingement and no change. L3-4: Disc is normal. Moderate bilateral facet and ligamentum  flava hypertrophy causing mild to spinal stenosis, unchanged. There is a 9 mm cyst adjacent to the left side of the tip of the spinous process of L3, unchanged and of no significance.  L4-5: Small broad-based disc bulge with disc space narrowing  without neural impingement. Mild to moderate bilateral facet arthritis.  She had epidural injeciton for her low back pain without much hlep  Review of Systems  Out of a complete 14 system review, the patient complains of only the following symptoms, and all other reviewed systems are negative.   Constitutional:   N/A Cardiovascular:  Swelling in her legs. Ear/Nose/Throat:  Hearing loss Skin: N/A Eyes: eye pain Respiratory: N/A Gastroitestinal: incontinence Hematology/Lymphatic:  N/A Endocrine:  N/A Musculoskeletal: joints pain, swelling, aching muscles. Allergy/Immunology: allergy, runny nose Neurological: memory loss Psychiatric:    N/A  Physical Exam  General:  well-developed white female.   Neurologic Exam  Mental Status: Alert and oriented to time, place, and person.  Follows commands.  No aphasia, agnosia, or apraxia. Cranial Nerves: Visual fields are full to count fingers examination.   Extraocular movements full.  left ptosis Corneals are present.  Hearing intact.  Air conduction greater than bone conduction.  No facial asymmetry.  Tongue to the right, uvula midline,    Sternocleidomastoid and trapezius testing normal.   Motor: She wear a left ankle brace, 4+/5 strength proximally and distally in the upper and lower extremities. No evidence of bilateral upper extremity proximal, pronator, or distal drift.    Swelling in the left leg  greater than the right.  Sensory:   Decreased pinprick, joint  position, and touch  to mid shin level.   Coordination:  outstretched hand and arm tremor tremor.  Intact finger-to-nose, heel-to-shin, and rapid alternating movements.  No evidence of rebound. Gait and Station:  Unsteady, wearing a left ankle brace. Reflexes: present at bilateral upper extremity, absent knee reflexes. Plantar response was flexor at right.  Assessment and Plan: 78 years old Caucasian female, with constellation of complaints, mainly low back pain, degenerative disc disease on MRI of the spine, she choose to continue to  followup by  pain specialist, and orthopedics, she could not tolerate multiple neuropathic pain medications in the past, including Neurontin, Lyrica Cymbalta, will only return to clinic as needed

## 2013-02-26 DIAGNOSIS — N3941 Urge incontinence: Secondary | ICD-10-CM | POA: Diagnosis not present

## 2013-02-26 DIAGNOSIS — N3020 Other chronic cystitis without hematuria: Secondary | ICD-10-CM | POA: Diagnosis not present

## 2013-03-11 ENCOUNTER — Encounter: Payer: Self-pay | Admitting: Internal Medicine

## 2013-03-11 DIAGNOSIS — E1149 Type 2 diabetes mellitus with other diabetic neurological complication: Secondary | ICD-10-CM | POA: Diagnosis not present

## 2013-03-11 DIAGNOSIS — M25579 Pain in unspecified ankle and joints of unspecified foot: Secondary | ICD-10-CM | POA: Diagnosis not present

## 2013-03-11 DIAGNOSIS — L97409 Non-pressure chronic ulcer of unspecified heel and midfoot with unspecified severity: Secondary | ICD-10-CM | POA: Diagnosis not present

## 2013-03-13 DIAGNOSIS — M546 Pain in thoracic spine: Secondary | ICD-10-CM | POA: Diagnosis not present

## 2013-03-13 DIAGNOSIS — M545 Low back pain, unspecified: Secondary | ICD-10-CM | POA: Diagnosis not present

## 2013-03-13 DIAGNOSIS — M5137 Other intervertebral disc degeneration, lumbosacral region: Secondary | ICD-10-CM | POA: Diagnosis not present

## 2013-03-13 DIAGNOSIS — M4120 Other idiopathic scoliosis, site unspecified: Secondary | ICD-10-CM | POA: Diagnosis not present

## 2013-03-14 ENCOUNTER — Other Ambulatory Visit: Payer: Self-pay | Admitting: Orthopaedic Surgery

## 2013-03-14 DIAGNOSIS — M546 Pain in thoracic spine: Secondary | ICD-10-CM

## 2013-03-14 DIAGNOSIS — M5136 Other intervertebral disc degeneration, lumbar region: Secondary | ICD-10-CM

## 2013-03-18 ENCOUNTER — Ambulatory Visit
Admission: RE | Admit: 2013-03-18 | Discharge: 2013-03-18 | Disposition: A | Discharge status: Home / Self Care | Payer: Medicare Other | Source: Ambulatory Visit | Attending: Orthopaedic Surgery | Admitting: Orthopaedic Surgery

## 2013-03-18 DIAGNOSIS — M47814 Spondylosis without myelopathy or radiculopathy, thoracic region: Secondary | ICD-10-CM | POA: Diagnosis not present

## 2013-03-18 DIAGNOSIS — M48061 Spinal stenosis, lumbar region without neurogenic claudication: Secondary | ICD-10-CM | POA: Diagnosis not present

## 2013-03-18 DIAGNOSIS — M546 Pain in thoracic spine: Secondary | ICD-10-CM

## 2013-03-18 DIAGNOSIS — M5137 Other intervertebral disc degeneration, lumbosacral region: Secondary | ICD-10-CM | POA: Diagnosis not present

## 2013-03-18 DIAGNOSIS — M47817 Spondylosis without myelopathy or radiculopathy, lumbosacral region: Secondary | ICD-10-CM | POA: Diagnosis not present

## 2013-03-18 DIAGNOSIS — M5136 Other intervertebral disc degeneration, lumbar region: Secondary | ICD-10-CM

## 2013-03-25 DIAGNOSIS — M545 Low back pain, unspecified: Secondary | ICD-10-CM | POA: Diagnosis not present

## 2013-03-25 DIAGNOSIS — M25559 Pain in unspecified hip: Secondary | ICD-10-CM | POA: Diagnosis not present

## 2013-03-25 DIAGNOSIS — M48061 Spinal stenosis, lumbar region without neurogenic claudication: Secondary | ICD-10-CM | POA: Diagnosis not present

## 2013-04-01 ENCOUNTER — Telehealth: Payer: Self-pay | Admitting: Internal Medicine

## 2013-04-02 DIAGNOSIS — M545 Low back pain, unspecified: Secondary | ICD-10-CM | POA: Diagnosis not present

## 2013-04-02 DIAGNOSIS — M5137 Other intervertebral disc degeneration, lumbosacral region: Secondary | ICD-10-CM | POA: Diagnosis not present

## 2013-04-02 DIAGNOSIS — M48061 Spinal stenosis, lumbar region without neurogenic claudication: Secondary | ICD-10-CM | POA: Diagnosis not present

## 2013-04-02 NOTE — Telephone Encounter (Signed)
Ok to refill? Last OV 4.11.14 Last filled 4.11.14

## 2013-04-02 NOTE — Telephone Encounter (Signed)
Low risk UDS. Rx done

## 2013-04-22 DIAGNOSIS — M545 Low back pain, unspecified: Secondary | ICD-10-CM | POA: Diagnosis not present

## 2013-04-22 DIAGNOSIS — M48061 Spinal stenosis, lumbar region without neurogenic claudication: Secondary | ICD-10-CM | POA: Diagnosis not present

## 2013-04-22 DIAGNOSIS — M5137 Other intervertebral disc degeneration, lumbosacral region: Secondary | ICD-10-CM | POA: Diagnosis not present

## 2013-04-30 ENCOUNTER — Other Ambulatory Visit: Payer: Self-pay | Admitting: *Deleted

## 2013-04-30 MED ORDER — OMEPRAZOLE 20 MG PO CPDR: 20.0000 mg | DELAYED_RELEASE_CAPSULE | Freq: Two times a day (BID) | ORAL | Status: DC

## 2013-05-01 ENCOUNTER — Other Ambulatory Visit: Payer: Self-pay | Admitting: Internal Medicine

## 2013-05-01 NOTE — Telephone Encounter (Signed)
Refill done.  

## 2013-05-06 DIAGNOSIS — M545 Low back pain, unspecified: Secondary | ICD-10-CM | POA: Diagnosis not present

## 2013-05-06 DIAGNOSIS — G894 Chronic pain syndrome: Secondary | ICD-10-CM | POA: Insufficient documentation

## 2013-05-06 DIAGNOSIS — M48061 Spinal stenosis, lumbar region without neurogenic claudication: Secondary | ICD-10-CM | POA: Diagnosis not present

## 2013-05-07 DIAGNOSIS — M86679 Other chronic osteomyelitis, unspecified ankle and foot: Secondary | ICD-10-CM | POA: Diagnosis not present

## 2013-05-07 DIAGNOSIS — E1149 Type 2 diabetes mellitus with other diabetic neurological complication: Secondary | ICD-10-CM | POA: Diagnosis not present

## 2013-05-08 ENCOUNTER — Encounter (HOSPITAL_COMMUNITY): Payer: Self-pay | Admitting: *Deleted

## 2013-05-08 ENCOUNTER — Other Ambulatory Visit (HOSPITAL_COMMUNITY): Payer: Self-pay | Admitting: Orthopedic Surgery

## 2013-05-08 ENCOUNTER — Encounter (HOSPITAL_COMMUNITY): Payer: Self-pay | Admitting: Pharmacy Technician

## 2013-05-08 NOTE — Progress Notes (Addendum)
Both pt and daughter completed screening call. Pt sees Dr. Gala Romney as cardiologist. Pt states that she had both a stress test and cardiac cath here at [facility] in August  2011. Pt made aware to Stop taking Aspirin, Coumadin and  herbal medications. Do not take any NSAIDs ie: Ibuprofen, Advil, Naproxen or any medication containing Aspirin.

## 2013-05-09 ENCOUNTER — Inpatient Hospital Stay (HOSPITAL_COMMUNITY): Payer: Medicare Other

## 2013-05-09 ENCOUNTER — Encounter (HOSPITAL_COMMUNITY)
Admission: RE | Disposition: A | Discharge status: Skilled Nursing Facility | Payer: Self-pay | Source: Ambulatory Visit | Attending: Orthopedic Surgery

## 2013-05-09 ENCOUNTER — Encounter (HOSPITAL_COMMUNITY): Payer: Self-pay | Admitting: Anesthesiology

## 2013-05-09 ENCOUNTER — Inpatient Hospital Stay (HOSPITAL_COMMUNITY)
Admission: RE | Admit: 2013-05-09 | Discharge: 2013-05-12 | DRG: 041 | Disposition: A | Discharge status: Skilled Nursing Facility | Payer: Medicare Other | Source: Ambulatory Visit | Attending: Orthopedic Surgery | Admitting: Orthopedic Surgery

## 2013-05-09 ENCOUNTER — Inpatient Hospital Stay (HOSPITAL_COMMUNITY): Payer: Medicare Other | Admitting: Anesthesiology

## 2013-05-09 DIAGNOSIS — E11610 Type 2 diabetes mellitus with diabetic neuropathic arthropathy: Secondary | ICD-10-CM

## 2013-05-09 DIAGNOSIS — R269 Unspecified abnormalities of gait and mobility: Secondary | ICD-10-CM | POA: Diagnosis not present

## 2013-05-09 DIAGNOSIS — K2100 Gastro-esophageal reflux disease with esophagitis, without bleeding: Secondary | ICD-10-CM | POA: Diagnosis not present

## 2013-05-09 DIAGNOSIS — M869 Osteomyelitis, unspecified: Secondary | ICD-10-CM | POA: Diagnosis present

## 2013-05-09 DIAGNOSIS — N390 Urinary tract infection, site not specified: Secondary | ICD-10-CM | POA: Diagnosis present

## 2013-05-09 DIAGNOSIS — G988 Other disorders of nervous system: Secondary | ICD-10-CM | POA: Diagnosis present

## 2013-05-09 DIAGNOSIS — E1149 Type 2 diabetes mellitus with other diabetic neurological complication: Principal | ICD-10-CM | POA: Diagnosis present

## 2013-05-09 DIAGNOSIS — I10 Essential (primary) hypertension: Secondary | ICD-10-CM | POA: Diagnosis not present

## 2013-05-09 DIAGNOSIS — Z4889 Encounter for other specified surgical aftercare: Secondary | ICD-10-CM | POA: Diagnosis not present

## 2013-05-09 DIAGNOSIS — L97509 Non-pressure chronic ulcer of other part of unspecified foot with unspecified severity: Secondary | ICD-10-CM | POA: Diagnosis not present

## 2013-05-09 DIAGNOSIS — R52 Pain, unspecified: Secondary | ICD-10-CM | POA: Diagnosis not present

## 2013-05-09 DIAGNOSIS — A5211 Tabes dorsalis: Secondary | ICD-10-CM | POA: Diagnosis not present

## 2013-05-09 DIAGNOSIS — G8918 Other acute postprocedural pain: Secondary | ICD-10-CM | POA: Diagnosis not present

## 2013-05-09 DIAGNOSIS — M79609 Pain in unspecified limb: Secondary | ICD-10-CM | POA: Diagnosis not present

## 2013-05-09 DIAGNOSIS — M8618 Other acute osteomyelitis, other site: Secondary | ICD-10-CM | POA: Diagnosis not present

## 2013-05-09 DIAGNOSIS — M86179 Other acute osteomyelitis, unspecified ankle and foot: Secondary | ICD-10-CM | POA: Diagnosis not present

## 2013-05-09 DIAGNOSIS — Z01818 Encounter for other preprocedural examination: Secondary | ICD-10-CM | POA: Diagnosis not present

## 2013-05-09 DIAGNOSIS — E119 Type 2 diabetes mellitus without complications: Secondary | ICD-10-CM | POA: Diagnosis not present

## 2013-05-09 DIAGNOSIS — M6281 Muscle weakness (generalized): Secondary | ICD-10-CM | POA: Diagnosis not present

## 2013-05-09 DIAGNOSIS — M1990 Unspecified osteoarthritis, unspecified site: Secondary | ICD-10-CM | POA: Diagnosis not present

## 2013-05-09 HISTORY — PX: ANKLE FUSION: SHX5718

## 2013-05-09 LAB — CBC
HCT: 34.4 % — ABNORMAL LOW (ref 36.0–46.0)
Hemoglobin: 11.2 g/dL — ABNORMAL LOW (ref 12.0–15.0)
MCH: 29.4 pg (ref 26.0–34.0)
MCHC: 32.6 g/dL (ref 30.0–36.0)
MCV: 90.3 fL (ref 78.0–100.0)
Platelets: 188 10*3/uL (ref 150–400)
RBC: 3.81 MIL/uL — ABNORMAL LOW (ref 3.87–5.11)
RDW: 14.8 % (ref 11.5–15.5)
WBC: 5.3 10*3/uL (ref 4.0–10.5)

## 2013-05-09 LAB — COMPREHENSIVE METABOLIC PANEL
ALT: 9 U/L (ref 0–35)
AST: 12 U/L (ref 0–37)
Albumin: 3.6 g/dL (ref 3.5–5.2)
Alkaline Phosphatase: 71 U/L (ref 39–117)
BUN: 17 mg/dL (ref 6–23)
CO2: 22 mEq/L (ref 19–32)
Calcium: 9.3 mg/dL (ref 8.4–10.5)
Chloride: 107 mEq/L (ref 96–112)
Creatinine, Ser: 0.84 mg/dL (ref 0.50–1.10)
GFR calc Af Amer: 77 mL/min — ABNORMAL LOW (ref >90)
GFR calc non Af Amer: 66 mL/min — ABNORMAL LOW (ref >90)
Glucose, Bld: 117 mg/dL — ABNORMAL HIGH (ref 70–99)
Potassium: 4.6 mEq/L (ref 3.5–5.1)
Sodium: 139 mEq/L (ref 135–145)
Total Bilirubin: 0.2 mg/dL — ABNORMAL LOW (ref 0.3–1.2)
Total Protein: 6.7 g/dL (ref 6.0–8.3)

## 2013-05-09 LAB — GLUCOSE, CAPILLARY
Glucose-Capillary: 144 mg/dL — ABNORMAL HIGH (ref 70–99)
Glucose-Capillary: 108 mg/dL — ABNORMAL HIGH (ref 70–99)
Glucose-Capillary: 127 mg/dL — ABNORMAL HIGH (ref 70–99)

## 2013-05-09 LAB — PROTIME-INR
INR: 0.99 (ref 0.00–1.49)
Prothrombin Time: 12.9 seconds (ref 11.6–15.2)

## 2013-05-09 LAB — APTT: aPTT: 28 seconds (ref 24–37)

## 2013-05-09 SURGERY — ANKLE FUSION
Anesthesia: Regional | Site: Foot | Laterality: Right | Wound class: Clean

## 2013-05-09 MED ORDER — METOCLOPRAMIDE HCL 5 MG/ML IJ SOLN: 5.0000 mg | Freq: Three times a day (TID) | INTRAMUSCULAR | Status: DC | PRN

## 2013-05-09 MED ORDER — SCOPOLAMINE 1 MG/3DAYS TD PT72
MEDICATED_PATCH | TRANSDERMAL | Status: AC
  Administered 2013-05-09: 1.5 mg
  Filled 2013-05-09: qty 1

## 2013-05-09 MED ORDER — GENTAMICIN SULFATE 40 MG/ML IJ SOLN
INTRAMUSCULAR | Status: DC | PRN
  Administered 2013-05-09: 80 mg via INTRAMUSCULAR

## 2013-05-09 MED ORDER — ASPIRIN EC 325 MG PO TBEC
325.0000 mg | DELAYED_RELEASE_TABLET | Freq: Every day | ORAL | Status: DC
  Administered 2013-05-09 – 2013-05-12 (×4): 325 mg via ORAL
  Filled 2013-05-09 (×4): qty 1

## 2013-05-09 MED ORDER — ONDANSETRON HCL 4 MG/2ML IJ SOLN
INTRAMUSCULAR | Status: DC | PRN
  Administered 2013-05-09: 4 mg via INTRAVENOUS

## 2013-05-09 MED ORDER — INSULIN ASPART 100 UNIT/ML ~~LOC~~ SOLN
0.0000 [IU] | Freq: Three times a day (TID) | SUBCUTANEOUS | Status: DC
  Administered 2013-05-10: 2 [IU] via SUBCUTANEOUS
  Administered 2013-05-10: 3 [IU] via SUBCUTANEOUS
  Administered 2013-05-11 – 2013-05-12 (×2): 2 [IU] via SUBCUTANEOUS

## 2013-05-09 MED ORDER — MIDAZOLAM HCL 2 MG/2ML IJ SOLN
1.0000 mg | INTRAMUSCULAR | Status: DC | PRN
  Administered 2013-05-09: 1 mg via INTRAVENOUS
  Filled 2013-05-09: qty 2

## 2013-05-09 MED ORDER — AMLODIPINE BESYLATE 5 MG PO TABS
5.0000 mg | ORAL_TABLET | Freq: Every evening | ORAL | Status: DC
  Administered 2013-05-09 – 2013-05-11 (×2): 5 mg via ORAL
  Filled 2013-05-09 (×4): qty 1

## 2013-05-09 MED ORDER — OXYCODONE-ACETAMINOPHEN 5-325 MG PO TABS
1.0000 | ORAL_TABLET | ORAL | Status: DC | PRN
  Administered 2013-05-09 – 2013-05-10 (×5): 2 via ORAL
  Filled 2013-05-09 (×4): qty 2

## 2013-05-09 MED ORDER — TRIMETHOPRIM 100 MG PO TABS
100.0000 mg | ORAL_TABLET | Freq: Every day | ORAL | Status: DC
  Administered 2013-05-09 – 2013-05-11 (×3): 100 mg via ORAL
  Filled 2013-05-09 (×5): qty 1

## 2013-05-09 MED ORDER — CLINDAMYCIN PHOSPHATE 600 MG/50ML IV SOLN
600.0000 mg | Freq: Four times a day (QID) | INTRAVENOUS | Status: AC
  Administered 2013-05-09 – 2013-05-10 (×3): 600 mg via INTRAVENOUS
  Filled 2013-05-09 (×4): qty 50

## 2013-05-09 MED ORDER — BENAZEPRIL HCL 10 MG PO TABS
10.0000 mg | ORAL_TABLET | Freq: Every evening | ORAL | Status: DC
  Administered 2013-05-09 – 2013-05-11 (×2): 10 mg via ORAL
  Filled 2013-05-09 (×4): qty 1

## 2013-05-09 MED ORDER — FENTANYL CITRATE 0.05 MG/ML IJ SOLN
INTRAMUSCULAR | Status: DC | PRN
  Administered 2013-05-09: 50 ug via INTRAVENOUS
  Administered 2013-05-09 (×4): 25 ug via INTRAVENOUS

## 2013-05-09 MED ORDER — ONDANSETRON HCL 4 MG/2ML IJ SOLN: 4.0000 mg | Freq: Four times a day (QID) | INTRAMUSCULAR | Status: DC | PRN

## 2013-05-09 MED ORDER — OXYCODONE-ACETAMINOPHEN 5-325 MG PO TABS
ORAL_TABLET | ORAL | Status: AC
  Filled 2013-05-09: qty 2

## 2013-05-09 MED ORDER — CLINDAMYCIN PHOSPHATE 900 MG/50ML IV SOLN
900.0000 mg | INTRAVENOUS | Status: AC
  Administered 2013-05-09: 900 mg via INTRAVENOUS
  Filled 2013-05-09: qty 50

## 2013-05-09 MED ORDER — PANTOPRAZOLE SODIUM 40 MG PO TBEC
40.0000 mg | DELAYED_RELEASE_TABLET | Freq: Every day | ORAL | Status: DC
  Administered 2013-05-10 – 2013-05-12 (×3): 40 mg via ORAL
  Filled 2013-05-09 (×3): qty 1

## 2013-05-09 MED ORDER — SODIUM CHLORIDE 0.9 % IV SOLN
INTRAVENOUS | Status: DC
  Administered 2013-05-09: 21:00:00 via INTRAVENOUS

## 2013-05-09 MED ORDER — HYDROMORPHONE HCL PF 1 MG/ML IJ SOLN: 0.5000 mg | INTRAMUSCULAR | Status: DC | PRN

## 2013-05-09 MED ORDER — HYDROCODONE-ACETAMINOPHEN 10-325 MG PO TABS
1.0000 | ORAL_TABLET | ORAL | Status: DC | PRN
  Administered 2013-05-10 – 2013-05-12 (×7): 2 via ORAL
  Filled 2013-05-09 (×7): qty 2

## 2013-05-09 MED ORDER — FENTANYL CITRATE 0.05 MG/ML IJ SOLN
50.0000 ug | INTRAMUSCULAR | Status: DC | PRN
  Administered 2013-05-09: 50 ug via INTRAVENOUS
  Filled 2013-05-09: qty 2

## 2013-05-09 MED ORDER — MIDAZOLAM HCL 5 MG/5ML IJ SOLN
INTRAMUSCULAR | Status: DC | PRN
  Administered 2013-05-09: 2 mg via INTRAVENOUS

## 2013-05-09 MED ORDER — VANCOMYCIN HCL 500 MG IV SOLR
INTRAVENOUS | Status: DC | PRN
  Administered 2013-05-09: 500 mg

## 2013-05-09 MED ORDER — LIDOCAINE HCL (CARDIAC) 20 MG/ML IV SOLN
INTRAVENOUS | Status: DC | PRN
  Administered 2013-05-09: 40 mg via INTRAVENOUS

## 2013-05-09 MED ORDER — 0.9 % SODIUM CHLORIDE (POUR BTL) OPTIME
TOPICAL | Status: DC | PRN
  Administered 2013-05-09: 1000 mL

## 2013-05-09 MED ORDER — FENTANYL CITRATE 0.05 MG/ML IJ SOLN: 25.0000 ug | INTRAMUSCULAR | Status: DC | PRN

## 2013-05-09 MED ORDER — METOCLOPRAMIDE HCL 10 MG PO TABS: 5.0000 mg | ORAL_TABLET | Freq: Three times a day (TID) | ORAL | Status: DC | PRN

## 2013-05-09 MED ORDER — AMLODIPINE BESY-BENAZEPRIL HCL 5-10 MG PO CAPS: 1.0000 | ORAL_CAPSULE | Freq: Every evening | ORAL | Status: DC

## 2013-05-09 MED ORDER — BUPIVACAINE-EPINEPHRINE PF 0.5-1:200000 % IJ SOLN
INTRAMUSCULAR | Status: DC | PRN
  Administered 2013-05-09: 30 mL

## 2013-05-09 MED ORDER — ONDANSETRON HCL 4 MG PO TABS: 4.0000 mg | ORAL_TABLET | Freq: Four times a day (QID) | ORAL | Status: DC | PRN

## 2013-05-09 MED ORDER — LACTATED RINGERS IV SOLN
INTRAVENOUS | Status: DC | PRN
  Administered 2013-05-09: 12:00:00 via INTRAVENOUS

## 2013-05-09 MED ORDER — VANCOMYCIN HCL 500 MG IV SOLR
INTRAVENOUS | Status: AC
  Filled 2013-05-09: qty 500

## 2013-05-09 MED ORDER — MECLIZINE HCL 25 MG PO TABS
25.0000 mg | ORAL_TABLET | Freq: Two times a day (BID) | ORAL | Status: DC | PRN
  Filled 2013-05-09: qty 1

## 2013-05-09 MED ORDER — PROPOFOL 10 MG/ML IV BOLUS
INTRAVENOUS | Status: DC | PRN
  Administered 2013-05-09: 140 mg via INTRAVENOUS
  Administered 2013-05-09: 60 mg via INTRAVENOUS

## 2013-05-09 MED ORDER — GENTAMICIN SULFATE 40 MG/ML IJ SOLN
INTRAMUSCULAR | Status: AC
  Filled 2013-05-09: qty 4

## 2013-05-09 SURGICAL SUPPLY — 42 items
BANDAGE ESMARK 6X9 LF (GAUZE/BANDAGES/DRESSINGS) ×1 IMPLANT
BANDAGE GAUZE ELAST BULKY 4 IN (GAUZE/BANDAGES/DRESSINGS) ×3 IMPLANT
BLADE SAW SGTL HD 18.5X60.5X1. (BLADE) ×2 IMPLANT
BLADE SURG 10 STRL SS (BLADE) IMPLANT
BNDG CMPR 9X6 STRL LF SNTH (GAUZE/BANDAGES/DRESSINGS) ×1
BNDG COHESIVE 4X5 TAN STRL (GAUZE/BANDAGES/DRESSINGS) ×2 IMPLANT
BNDG ESMARK 6X9 LF (GAUZE/BANDAGES/DRESSINGS) ×2
CLOTH BEACON ORANGE TIMEOUT ST (SAFETY) ×2 IMPLANT
COVER MAYO STAND STRL (DRAPES) IMPLANT
COVER SURGICAL LIGHT HANDLE (MISCELLANEOUS) ×2 IMPLANT
DRAPE INCISE IOBAN 66X45 STRL (DRAPES) ×2 IMPLANT
DRAPE OEC MINIVIEW 54X84 (DRAPES) IMPLANT
DRAPE U-SHAPE 47X51 STRL (DRAPES) ×2 IMPLANT
DRSG ADAPTIC 3X8 NADH LF (GAUZE/BANDAGES/DRESSINGS) ×2 IMPLANT
DURAPREP 26ML APPLICATOR (WOUND CARE) ×2 IMPLANT
ELECT REM PT RETURN 9FT ADLT (ELECTROSURGICAL) ×2
ELECTRODE REM PT RTRN 9FT ADLT (ELECTROSURGICAL) ×1 IMPLANT
GLOVE BIOGEL PI IND STRL 9 (GLOVE) ×1 IMPLANT
GLOVE BIOGEL PI INDICATOR 9 (GLOVE) ×1
GLOVE SURG ORTHO 9.0 STRL STRW (GLOVE) ×2 IMPLANT
GOWN PREVENTION PLUS XLARGE (GOWN DISPOSABLE) ×2 IMPLANT
GOWN SRG XL XLNG 56XLVL 4 (GOWN DISPOSABLE) ×2 IMPLANT
GOWN STRL NON-REIN XL XLG LVL4 (GOWN DISPOSABLE) ×4
GUIDEWIRE THREADED 2.8 (WIRE) ×2 IMPLANT
KIT BASIN OR (CUSTOM PROCEDURE TRAY) ×2 IMPLANT
KIT ROOM TURNOVER OR (KITS) ×2 IMPLANT
KIT STIMULAN 5CC (Orthopedic Implant) ×1 IMPLANT
MANIFOLD NEPTUNE II (INSTRUMENTS) ×1 IMPLANT
NS IRRIG 1000ML POUR BTL (IV SOLUTION) ×2 IMPLANT
PACK ORTHO EXTREMITY (CUSTOM PROCEDURE TRAY) ×2 IMPLANT
PAD ARMBOARD 7.5X6 YLW CONV (MISCELLANEOUS) ×4 IMPLANT
PAD CAST 4YDX4 CTTN HI CHSV (CAST SUPPLIES) ×1 IMPLANT
PADDING CAST COTTON 4X4 STRL (CAST SUPPLIES) ×2
SCREW CANN THREADED 7.3X85 (Screw) ×1 IMPLANT
SPONGE GAUZE 4X4 12PLY (GAUZE/BANDAGES/DRESSINGS) ×2 IMPLANT
SPONGE LAP 18X18 X RAY DECT (DISPOSABLE) ×2 IMPLANT
SUCTION FRAZIER TIP 10 FR DISP (SUCTIONS) ×2 IMPLANT
SUT ETHILON 2 0 PSLX (SUTURE) ×5 IMPLANT
TOWEL OR 17X24 6PK STRL BLUE (TOWEL DISPOSABLE) ×2 IMPLANT
TOWEL OR 17X26 10 PK STRL BLUE (TOWEL DISPOSABLE) ×2 IMPLANT
TUBE CONNECTING 12X1/4 (SUCTIONS) ×2 IMPLANT
WATER STERILE IRR 1000ML POUR (IV SOLUTION) ×2 IMPLANT

## 2013-05-09 NOTE — H&P (Signed)
[name] is an 78 y.o. female.   Chief Complaint: Charcot collapse right foot with ulceration and osteomyelitis HPI: Patient is a 78 year old woman with chronic Charcot collapse of the right foot. She has undergone prolonged conservative therapy and had wished to proceed with elective surgical intervention to correct the Charcot deformity. Patient was against at this time with ulceration in draining exposed bone and Charcot collapse through the base of the first metatarsal medial cuneiform.  Past Medical History  Diagnosis Date  . Hyperlipidemia   . Asthma   . Osteoporosis   . DVT (deep venous thrombosis) 12/2007    PE DVT after neck surgery, coumadin d/c 10-2008  . PE (pulmonary embolism) 12/2007  . Hiatal hernia     s/p repair   . Pulmonary nodule     incidental per CT:  Pet scan 4-9: likely benign, CT 08-2009 no change, no further CTs (Dr. Shelle Iron)  . Hemorrhoids   . IBS (irritable bowel syndrome)   . Colon polyp 1994    malignant  . Diverticulosis   . PPD positive   . Macular degeneration 03/2009    Dr. Earlene Plater  . Allergic rhinitis   . Dyspnea     chronic, extensive w/u see OV note 09-2010  . RSD (reflex sympathetic dystrophy)   . Hx of colonoscopy   . DJD (degenerative joint disease)   . Spinal stenosis   . Complication of anesthesia   . PONV (postoperative nausea and vomiting)   . GERD (gastroesophageal reflux disease)   . Syncope     seen Dr. Gala Romney  . Hypertension     sees Dr. Drue Novel, primary  . Diabetes mellitus     not on medications, controls with diet  . Chronic urinary tract infection   . Headache(784.0)     migraines when younger  . Neuropathy     "has in hands and feet and legs"  . Hyperthyroidism     Past Surgical History  Procedure Laterality Date  . Hiatal hernia repair  01/25/10    with Nissen Fundaplication  . Cholecystectomy    . Total abdominal hysterectomy w/ bilateral salpingoophorectomy    . Cataract extraction    . Rotator cuff repair   07/2007    Dr. Eulah Pont  . Cervical spine surgery  12/18/07    For OA, fusion. Dr. Ophelia Charter:  fu by a PE  . Toe amputation  08/2008    3rd right toe, Dr. Lajoyce Corners due to osteomyelitis  . Hand surgery  09/27/11    Left Hand  . Nasal septum surgery    . Vein ligation  1967  . Carpal tunnel  09/2011  . Amputation  03/06/2012    Procedure: AMPUTATION FOOT;  Surgeon: Nadara Mustard, MD;  Location: [facility] OR;  Service: Orthopedics;  Laterality: Left;  FIFTH RAY AMPUTATION   . Green filter       due to blood clots,   . Ankle fusion  09/27/2012    left ankle  . Ankle fusion  09/27/2012    Procedure: ANKLE FUSION;  Surgeon: Nadara Mustard, MD;  Location: [facility] OR;  Service: Orthopedics;  Laterality: Left;  Left Tibiocalcaneal Fusion  . Cardiac catheterization      Hx: of august 2011 at [facility]    Family History  Problem Relation Age of Onset  . Migraines      headaches  . Hypertension    . Heart disease Mother     mitral valve replaced  .  Rheum arthritis Mother   . Colon cancer Neg Hx   . Diabetic kidney disease Daughter    Social History:  reports that she has never smoked. She has never used smokeless tobacco. She reports that she does not drink alcohol or use illicit drugs.  Allergies:  Allergies  Allergen Reactions  . Cymbalta (Duloxetine Hcl) Swelling    Swelling in legs  . Gabapentin Swelling    Swelling in legs  . Adhesive (Tape)     rash  . Cefazolin Hives  . Ciprofloxacin Other (See Comments)    Per pt, caused body aches   . Clorazepate Dipotassium Other (See Comments)    Unknown reaction  . Darifenacin Hydrobromide Other (See Comments)    hypotension, near syncope  . Dilaudid (Hydromorphone Hcl) Other (See Comments)     confused, intense itching  . Enablex (Darifenacin Hydrobromide Er) Other (See Comments)    Hypotension, near syncope  . Lyrica (Pregabalin) Swelling    Swelling in legs  . Methadone Hcl Other (See Comments)    Reaction unknown  . Pentazocine Lactate Other  (See Comments)    Altered mental state, "climbing walls", anxiety  . Talwin (Pentazocine) Other (See Comments)    "climbing walls" anxiety  . Doxycycline Rash    Medications Prior to Admission  Medication Sig Dispense Refill  . acetaminophen (TYLENOL) 500 MG tablet Take 500 mg by mouth daily as needed for pain.      . Alpha-Lipoic Acid 600 MG CAPS Take 600 mg by mouth 2 (two) times daily.      Marland Kitchen amLODipine-benazepril (LOTREL) 5-10 MG per capsule Take 1 capsule by mouth every evening.       . Boron 3 MG CAPS Take 3 mg by mouth every evening.       . Cholecalciferol (VITAMIN D3) 5000 UNITS CAPS Take 5,000 Units by mouth every evening.      Marland Kitchen CINNAMON PO Take 250 mg by mouth 2 (two) times daily.       . Cyanocobalamin (VITAMIN B-12) 2500 MCG SUBL Place 2,500 mcg under the tongue daily.      . diclofenac sodium (VOLTAREN) 1 % GEL Apply 1 application topically daily as needed (pain).      Marland Kitchen HYDROcodone-acetaminophen (NORCO) 10-325 MG per tablet Take 1 tablet by mouth 4 (four) times daily.      Marland Kitchen ibuprofen (ADVIL,MOTRIN) 200 MG tablet Take 400 mg by mouth daily as needed for pain.      Boris Lown Oil 300 MG CAPS Take 300 mg by mouth every evening.       . meclizine (ANTIVERT) 12.5 MG tablet Take 25 mg by mouth 2 (two) times daily as needed for nausea.      . Multiple Vitamins-Minerals (PRESERVISION AREDS 2) CAPS Take 1 capsule by mouth 2 (two) times daily.      . nabumetone (RELAFEN) 750 MG tablet Take 750 mg by mouth 2 (two) times daily.      Marland Kitchen omeprazole (PRILOSEC) 20 MG capsule Take 20 mg by mouth daily.      Marland Kitchen OVER THE COUNTER MEDICATION Take 1-2 capsules by mouth 2 (two) times daily. Glucose Essentials - 1 capsule every morning, 2 capsules every night      . OVER THE COUNTER MEDICATION Take 400 mg by mouth 2 (two) times daily. tumeric      . OVER THE COUNTER MEDICATION Take 1 capsule by mouth 2 (two) times daily. Integrative Digestive Formula      .  Polyvinyl Alcohol-Povidone (REFRESH OP)  Place 1 drop into both eyes daily as needed (dry eyes).      . Probiotic Product (PROBIOTIC DAILY PO) Take 1 capsule by mouth daily.      Marland Kitchen trimethoprim (TRIMPEX) 100 MG tablet Take 100 mg by mouth at bedtime.      . Lancets (FREESTYLE) lancets use twice a day  100 each  10    Results for orders placed during the hospital encounter of 05/09/13 (from the past 48 hour(s))  APTT     Status: None   Collection Time    05/09/13  9:36 AM      Result Value Range   aPTT 28  24 - 37 seconds  CBC     Status: Abnormal   Collection Time    05/09/13  9:36 AM      Result Value Range   WBC 5.3  4.0 - 10.5 K/uL   RBC 3.81 (*) 3.87 - 5.11 MIL/uL   Hemoglobin 11.2 (*) 12.0 - 15.0 g/dL   HCT 16.1 (*) 09.6 - 04.5 %   MCV 90.3  78.0 - 100.0 fL   MCH 29.4  26.0 - 34.0 pg   MCHC 32.6  30.0 - 36.0 g/dL   RDW 40.9  81.1 - 91.4 %   Platelets 188  150 - 400 K/uL  COMPREHENSIVE METABOLIC PANEL     Status: Abnormal   Collection Time    05/09/13  9:36 AM      Result Value Range   Sodium 139  135 - 145 mEq/L   Potassium 4.6  3.5 - 5.1 mEq/L   Chloride 107  96 - 112 mEq/L   CO2 22  19 - 32 mEq/L   Glucose, Bld 117 (*) 70 - 99 mg/dL   BUN 17  6 - 23 mg/dL   Creatinine, Ser 7.82  0.50 - 1.10 mg/dL   Calcium 9.3  8.4 - 95.6 mg/dL   Total Protein 6.7  6.0 - 8.3 g/dL   Albumin 3.6  3.5 - 5.2 g/dL   AST 12  0 - 37 U/L   ALT 9  0 - 35 U/L   Alkaline Phosphatase 71  39 - 117 U/L   Total Bilirubin 0.2 (*) 0.3 - 1.2 mg/dL   GFR calc non Af Amer 66 (*) >90 mL/min   GFR calc Af Amer 77 (*) >90 mL/min   Comment:            The eGFR has been calculated     using the CKD EPI equation.     This calculation has not been     validated in all clinical     situations.     eGFR's persistently     <90 mL/min signify     possible Chronic Kidney Disease.  PROTIME-INR     Status: None   Collection Time    05/09/13  9:36 AM      Result Value Range   Prothrombin Time 12.9  11.6 - 15.2 seconds   INR 0.99  0.00 - 1.49    Dg Chest 2 View  05/09/2013   *RADIOLOGY REPORT*  Clinical Data: Preoperative films.  Osteomyelitis.  CHEST - 2 VIEW  Comparison: PA and lateral chest 03/05/2012.  Findings: The lungs are clear.  Heart size is normal.  No pneumothorax or pleural effusion.  No focal bony abnormality.  IVC filter noted.  IMPRESSION: No acute disease.   Original Report Authenticated By: Holley Dexter, M.D.  Review of Systems  All other systems reviewed and are negative.    Blood pressure 127/60, pulse 87, temperature 97.5 F (36.4 C), temperature source Oral, resp. rate 17, height 5\' 6"  (1.676 m), weight 86.183 kg (190 lb), SpO2 100.00%. Physical Exam  On examination patient has palpable pulses. There is an ulcer that probes to bone at the base of first metatarsal. There is drainage she is a Wagner grade 3 ulcer. A ray graft shows no destructive changes of the bone. Assessment/Plan Assessment: Acute osteomyelitis right foot with Charcot collapse and Wagner grade 3 ulceration.  Plan: Will plan for excision of the involved bone placement of intramedullary implant to stabilize the medial column with medial column realignment. Risks and benefits were discussed including persistent infection neurovascular injury pain need for additional surgery. Patient states she understands and wished to proceed at this time.  Jessaca Philippi V 05/09/2013, 11:42 AM

## 2013-05-09 NOTE — Anesthesia Postprocedure Evaluation (Signed)
Anesthesia Post Note  Patient: [name]  Procedure(s) Performed: Procedure(s) (LRB): ANKLE FUSION (Right)  Anesthesia type: General  Patient location: PACU  Post pain: Pain level controlled and Adequate analgesia  Post assessment: Post-op Vital signs reviewed, Patient's Cardiovascular Status Stable, Respiratory Function Stable, Patent Airway and Pain level controlled  Last Vitals:  Filed Vitals:   05/09/13 1315  BP: 122/62  Pulse: 76  Temp:   Resp: 16    Post vital signs: Reviewed and stable  Level of consciousness: awake, alert  and oriented  Complications: No apparent anesthesia complications

## 2013-05-09 NOTE — Transfer of Care (Signed)
Immediate Anesthesia Transfer of Care Note  Patient: [name]  Procedure(s) Performed: Procedure(s) with comments: ANKLE FUSION (Right) - Excision Osteomyelitis Base 1st MT Right Foot, Fusion Medial Column  Patient Location: PACU  Anesthesia Type:General  Level of Consciousness: awake, alert  and oriented  Airway & Oxygen Therapy: Patient Spontanous Breathing and Patient connected to nasal cannula oxygen  Post-op Assessment: Report given to PACU RN and Post -op Vital signs reviewed and stable  Post vital signs: Reviewed and stable  Complications: No apparent anesthesia complications

## 2013-05-09 NOTE — Progress Notes (Signed)
Orthopedic Tech Progress Note Patient Details:  [name] 1935-03-14 [id number] Post op shoe applied to Right LE. Tolerated well.  Ortho Devices Type of Ortho Device: Postop shoe/boot Ortho Device/Splint Location: Right LE Ortho Device/Splint Interventions: Application   Asia R Thompson 05/09/2013, 2:08 PM

## 2013-05-09 NOTE — Progress Notes (Signed)
Report given to jamie rn as caregiver 

## 2013-05-09 NOTE — Anesthesia Preprocedure Evaluation (Signed)
Anesthesia Evaluation  Patient identified by MRN, date of birth, ID band Patient awake    Reviewed: Allergy & Precautions, H&P , NPO status , Patient's Chart, lab work & pertinent test results  History of Anesthesia Complications (+) PONV  Airway Mallampati: II  Neck ROM: full    Dental   Pulmonary shortness of breath, asthma ,          Cardiovascular hypertension,     Neuro/Psych  Headaches,  Neuromuscular disease    GI/Hepatic hiatal hernia, GERD-  ,  Endo/Other  diabetes, Type 2Hyperthyroidism   Renal/GU      Musculoskeletal   Abdominal   Peds  Hematology   Anesthesia Other Findings   Reproductive/Obstetrics                           Anesthesia Physical Anesthesia Plan  ASA: III  Anesthesia Plan: General and Regional   Post-op Pain Management: MAC Combined w/ Regional for Post-op pain   Induction: Intravenous  Airway Management Planned: LMA  Additional Equipment:   Intra-op Plan:   Post-operative Plan:   Informed Consent: I have reviewed the patients History and Physical, chart, labs and discussed the procedure including the risks, benefits and alternatives for the proposed anesthesia with the patient or authorized representative who has indicated his/her understanding and acceptance.     Plan Discussed with: CRNA, Anesthesiologist and Surgeon  Anesthesia Plan Comments:         Anesthesia Quick Evaluation

## 2013-05-09 NOTE — Op Note (Signed)
OPERATIVE REPORT  DATE OF SURGERY: 05/09/2013  PATIENT:  [name],  78 y.o. female  PRE-OPERATIVE DIAGNOSIS:  Charcot Ulcer Right Foot with Osteomyelitis   POST-OPERATIVE DIAGNOSIS: Same  PROCEDURE:  Procedure(s): Medial column fusion right foot. Partial medial cuneiform  excision and partial excision of the base of the first metatarsal secondary to osteomyelitis. Excision of ulcer. Local tissue rearrangement with wound 8 x 6 cm. Insertion of antibiotic beads.  SURGEON:  Surgeon(s): Nadara Mustard, MD  ANESTHESIA:   general  EBL:  Minimal ML  SPECIMEN:  No Specimen  TOURNIQUET:  * No tourniquets in log *  PROCEDURE DETAILS: Patient is a 78 year old woman with diabetic insensate neuropathy and Charcot collapse of the right foot she has ulceration with exposed bone draining ulcer and presents at this time for surgical intervention. Description of procedure patient was brought to the operating room and underwent a general anesthetic. After adequate levels of anesthesia were obtained patient's right lower extremity was prepped using DuraPrep draped into a sterile field. A curvilinear incision was used to encompass the ulcer and provide a medial longitudinal incision. This was carried sharply down to bone. The base of the first metatarsal and medial cuneiform were excised to excise the bone involved with the ulcer. The medial column was then realigned and stabilized with an intramedullary nail over a guidewire 85 mm in length 7.3 mm in diameter. C-arm fluoroscopy verified alignment. The wound was irrigated with normal saline antibiotic beads were placed 5 cc stimulant beads with 500 mg vancomycin and 160 mg gentamicin. Local tissue rearrangement was performed and the wound was closed. The wound was covered Adaptic orthopedic sponges AB dressing Kerlix and Coban. Patient was extubated taken to the PACU in stable condition.  PLAN OF CARE: Admit to inpatient   PATIENT DISPOSITION:  PACU -  hemodynamically stable.   Nadara Mustard, MD 05/09/2013 12:48 PM

## 2013-05-09 NOTE — Anesthesia Procedure Notes (Addendum)
Anesthesia Regional Block:  Popliteal block  Pre-Anesthetic Checklist: ,, timeout performed, Correct Patient, Correct Site, Correct Laterality, Correct Procedure, Correct Position, site marked, Risks and benefits discussed,  Surgical consent,  Pre-op evaluation,  At surgeon's request and post-op pain management  Laterality: Right  Prep: chloraprep       Needles:  Injection technique: Single-shot  Needle Type: Echogenic Stimulator Needle          Additional Needles:  Procedures: ultrasound guided (picture in chart) and nerve stimulator Popliteal block  Nerve Stimulator or Paresthesia:  Response: plantar flexion of foot, 0.45 mA,   Additional Responses:   Narrative:  Start time: 05/09/2013 10:50 AM End time: 05/09/2013 11:03 AM Injection made incrementally with aspirations every 5 mL.  Performed by: Personally  Anesthesiologist: Dr Chaney Malling  Additional Notes: Functioning IV was confirmed and monitors were applied.  A 90mm 21ga Arrow echogenic stimulator needle was used. Sterile prep and drape,hand hygiene and sterile gloves were used.  Negative aspiration and negative test dose prior to incremental administration of local anesthetic. The patient tolerated the procedure well.  Ultrasound guidance: relevent anatomy identified, needle position confirmed, local anesthetic spread visualized around nerve(s), vascular puncture avoided.  Image printed for medical record.   Popliteal block

## 2013-05-09 NOTE — Preoperative (Signed)
Beta Blockers   Reason not to administer Beta Blockers:Not Applicable 

## 2013-05-10 LAB — GLUCOSE, CAPILLARY
Glucose-Capillary: 163 mg/dL — ABNORMAL HIGH (ref 70–99)
Glucose-Capillary: 125 mg/dL — ABNORMAL HIGH (ref 70–99)
Glucose-Capillary: 99 mg/dL (ref 70–99)
Glucose-Capillary: 111 mg/dL — ABNORMAL HIGH (ref 70–99)

## 2013-05-10 NOTE — Progress Notes (Signed)
Patient ID: [name], female   DOB: 12/28/1934, 78 y.o.   MRN: [id number] Postoperative day 1 Charcot reconstruction right foot. Patient is comfortable. Plan for therapy nonweightbearing on the right. Plan for discharge to skilled nursing. Patient prefers [facility]. F. L2 not on the chart yet.

## 2013-05-10 NOTE — Evaluation (Signed)
Physical Therapy Evaluation Patient Details Name: [name] MRN: [id number] DOB: [birth year] Today's Date: 05/10/2013 Time: 0865-7846 PT Time Calculation (min): 16 min  PT Assessment / Plan / Recommendation History of Present Illness  s/p R ankle fusion  Clinical Impression  Patient is s/p R ankle fusion surgery resulting in functional limitations due to the deficits listed below (see PT Problem List). Patient will benefit from skilled PT to increase their independence and safety with mobility to allow discharge to the venue listed below. Pt plans to D/C to [facility] for continued rehab. Has decreased ability to maintain NWB status with mobility and fatigues quickly.     PT Assessment  Patient needs continued PT services    Follow Up Recommendations  SNF    Does the patient have the potential to tolerate intense rehabilitation      Barriers to Discharge Decreased caregiver support;Inaccessible home environment      Equipment Recommendations  None recommended by PT    Recommendations for Other Services     Frequency Min 4X/week    Precautions / Restrictions Precautions Precautions: Fall Required Braces or Orthoses: Other Brace/Splint Other Brace/Splint: post op shoe Restrictions Weight Bearing Restrictions: Yes RLE Weight Bearing: Non weight bearing   Pertinent Vitals/Pain "i just hurt all the time. Its not too bad today'       Mobility  Bed Mobility Bed Mobility: Supine to Sit;Sitting - Scoot to Edge of Bed Supine to Sit: 5: Supervision;HOB elevated;With rails Sitting - Scoot to Edge of Bed: 6: Modified independent (Device/Increase time) Details for Bed Mobility Assistance: min cues for hand placement and sequencing; requires hand rails and HOB elevated > 30degrees Transfers Transfers: Sit to Stand;Stand to Sit Sit to Stand: 4: Min assist;From bed;From chair/3-in-1;With upper extremity assist Stand to Sit: 4: Min assist;To chair/3-in-1;With armrests;With upper  extremity assist Details for Transfer Assistance: (A) to achieve upright standing position while maintaining NWB status on R LE; vc's for hand placement and sequencing  Ambulation/Gait Ambulation/Gait Assistance: 4: Min assist Ambulation Distance (Feet): 4 Feet Assistive device: Rolling walker Ambulation/Gait Assistance Details: (A) to manage RW and maintain balance while maintaining NWB status on R LE; fatigues quickly and requires sitting rest break  Gait Pattern:  (hop to) Gait velocity: decreased due to pain and NWB status R LE Stairs: No Wheelchair Mobility Wheelchair Mobility: No    Exercises     PT Diagnosis: Difficulty walking;Acute pain  PT Problem List: Decreased strength;Decreased range of motion;Decreased balance;Decreased activity tolerance;Decreased mobility;Decreased knowledge of use of DME;Pain;Decreased knowledge of precautions PT Treatment Interventions: DME instruction;Gait training;Functional mobility training;Therapeutic activities;Therapeutic exercise;Balance training;Neuromuscular re-education;Patient/family education     PT Goals(Current goals can be found in the care plan section) Acute Rehab PT Goals Patient Stated Goal: to go to [facility] before home  PT Goal Formulation: With patient Time For Goal Achievement: 05/17/13 Potential to Achieve Goals: Good  Visit Information  Last PT Received On: 05/10/13 Assistance Needed: +2 (to manage chair for safety ) History of Present Illness: s/p R ankle fusion       Prior Functioning  Home Living Family/patient expects to be discharged to:: Private residence Living Arrangements: Children Available Help at Discharge: Skilled Nursing Facility Type of Home: House Home Access: Stairs to enter Secretary/administrator of Steps: 10 Entrance Stairs-Rails: Can reach both Home Layout: One level Home Equipment: Walker - 2 wheels;Cane - single point;Shower seat;Grab bars - toilet;Wheelchair - manual Additional Comments: pt  uses RW and cane in house; wheelchair in  the community. indepdent with ADLs. plans to D/C to [facility] Prior Function Level of Independence: Independent with assistive device(s) Communication Communication: No difficulties Dominant Hand: Right    Cognition  Cognition Arousal/Alertness: Awake/alert Behavior During Therapy: WFL for tasks assessed/performed Overall Cognitive Status: Within Functional Limits for tasks assessed    Extremity/Trunk Assessment Upper Extremity Assessment Upper Extremity Assessment: Overall WFL for tasks assessed Lower Extremity Assessment Lower Extremity Assessment: RLE deficits/detail RLE Deficits / Details: R ankle limited due to pain and immobilization RLE: Unable to fully assess due to immobilization;Unable to fully assess due to pain RLE Sensation:  (WFL to light touch above R ankle) Cervical / Trunk Assessment Cervical / Trunk Assessment: Normal   Balance Balance Balance Assessed: Yes Static Sitting Balance Static Sitting - Balance Support: No upper extremity supported;Feet supported Static Sitting - Level of Assistance: 5: Stand by assistance  End of Session PT - End of Session Equipment Utilized During Treatment: Gait belt Activity Tolerance: Patient tolerated treatment well Patient left: in chair;with call bell/phone within reach Nurse Communication: Mobility status  GP     Donell Sievert, [place] [phone] 05/10/2013, 9:22 AM

## 2013-05-11 LAB — GLUCOSE, CAPILLARY
Glucose-Capillary: 130 mg/dL — ABNORMAL HIGH (ref 70–99)
Glucose-Capillary: 123 mg/dL — ABNORMAL HIGH (ref 70–99)
Glucose-Capillary: 117 mg/dL — ABNORMAL HIGH (ref 70–99)
Glucose-Capillary: 117 mg/dL — ABNORMAL HIGH (ref 70–99)

## 2013-05-11 NOTE — Progress Notes (Signed)
Patient ID: [name], female   DOB: 07-25-1935, 78 y.o.   MRN: [id number] Plan for discharge to skilled nursing on Monday. Continue physical therapy progressive ambulation minimize weightbearing right lower extremity.

## 2013-05-11 NOTE — Clinical Social Work Psychosocial (Signed)
     Clinical Social Work Department BRIEF PSYCHOSOCIAL ASSESSMENT 05/11/2013  Patient:  NHYLA, NAPPI     Account Number:  [id number]     Admit date:  05/09/2013  Clinical Social Worker:  Tiburcio Pea  Date/Time:  05/11/2013 06:25 PM  Referred by:  Physician  Date Referred:  05/11/2013 Referred for  SNF Placement   Other Referral:   Interview type:  Patient Other interview type:    PSYCHOSOCIAL DATA Living Status:  WITH ADULT CHILDREN Admitted from facility:   Level of care:   Primary support name:  Donato Heinz    [phone] Primary support relationship to patient:  CHILD, ADULT Degree of support available:   Strong support    CURRENT CONCERNS Current Concerns  Post-Acute Placement   Other Concerns:    SOCIAL WORK ASSESSMENT / PLAN CSW met with patient to discuss need for short term rehab which has been recommended by PT.  Patient is agreeable and states that even though she has been at [facility] 2 times in the past, she would like to go to [facility] this time if possible. Her daughter has contacted the facility and it was indicated that they would have a bed on monday.  Pt was given the SNF list but really wants to go to [place].   Assessment/plan status:  Psychosocial Support/Ongoing Assessment of Needs Other assessment/ plan:   Information/referral to community resources:   SNF list provided    PATIENTS/FAMILYS RESPONSE TO PLAN OF CARE: Patient is alert and oriented; very pleasant lady. She wants to seek short term SNF as does her daughter. She is hoping for SNF at [facility]. Bed search is in place.

## 2013-05-11 NOTE — Clinical Social Work Placement (Signed)
     Clinical Social Work Department CLINICAL SOCIAL WORK PLACEMENT NOTE 05/11/2013  Patient:  [name], [name]  Account Number:  [id number] Admit date:  05/09/2013  Clinical Social Worker:  Lupita Leash Loudon Krakow, LCSWA  Date/time:  05/11/2013 06:22 PM  Clinical Social Work is seeking post-discharge placement for this patient at the following level of care:   SKILLED NURSING   (*CSW will update this form in Epic as items are completed)   05/11/2013  Patient/family provided with [facility] Department of Clinical Social Works list of facilities offering this level of care within the geographic area requested by the patient (or if unable, by the patients family).  05/11/2013  Patient/family informed of their freedom to choose among providers that offer the needed level of care, that participate in Medicare, Medicaid or managed care program needed by the patient, have an available bed and are willing to accept the patient.  05/11/2013  Patient/family informed of [facility] ownership interest in [facility], as well as of the fact that they are under no obligation to receive care at this facility.  PASARR submitted to EDS on  PASARR number received from EDS on   FL2 transmitted to all facilities in geographic area requested by pt/family on  05/11/2013 FL2 transmitted to all facilities within larger geographic area on   Patient informed that his/her managed care company has contracts with or will negotiate with  certain facilities, including the following:   Sent to [place] only has her daughter called facility and secured a bed.  Has existing PASARR     Patient/family informed of bed offers received:   Patient chooses bed at  Physician recommends and patient chooses bed at    Patient to be transferred to  on   Patient to be transferred to facility by Ambulance  [facility])  The following physician request were entered in Epic:   Additional Comments:

## 2013-05-11 NOTE — Progress Notes (Signed)
Physical Therapy Treatment Patient Details Name: [name] MRN: [id number] DOB: 06-06-1935 Today's Date: 05/11/2013 Time: 4098-1191 PT Time Calculation (min): 15 min  PT Assessment / Plan / Recommendation  PT Comments   Pt slowly progressing with therapy. Is highly motivated. Has difficulty with mobility due to NWB status and previous surgeries in L LE; making balance and mobility difficult. Pt demo overall decreased activity tolerance and mobility deficits. Plans to D/C to [facility] tomorrow. Will cont to f/u with pt to maximize functional mobility.   Follow Up Recommendations  SNF     Does the patient have the potential to tolerate intense rehabilitation     Barriers to Discharge        Equipment Recommendations  None recommended by PT    Recommendations for Other Services    Frequency Min 4X/week   Progress towards PT Goals Progress towards PT goals: Progressing toward goals  Plan Current plan remains appropriate    Precautions / Restrictions Precautions Precautions: Fall Required Braces or Orthoses: Other Brace/Splint Other Brace/Splint: post op shoe Restrictions Weight Bearing Restrictions: Yes RLE Weight Bearing: Non weight bearing   Pertinent Vitals/Pain 7/10; pre medicated per pt    Mobility  Bed Mobility Bed Mobility: Not assessed (not addressed; pt in chair and returned to chair) Transfers Transfers: Stand to Sit;Sit to Stand Sit to Stand: 4: Min assist;From chair/3-in-1;With armrests Stand to Sit: To chair/3-in-1;With upper extremity assist;With armrests;4: Min assist Details for Transfer Assistance: (A) to achieve upright standing position; has difficulty due to NWB status on R LE; pt has had previous surgery in L LE and has pain with it during transfers; vc's for hand placement and safety with RW Ambulation/Gait Ambulation/Gait Assistance: 4: Min assist Ambulation Distance (Feet): 5 Feet (x2) Assistive device: Rolling walker Ambulation/Gait Assistance  Details: pt has diffculty managing RW, while maintaining NWB status on R LE; pt tends to place R LE on ground for balance; requires max encouragement to participate; rest break required due to increase fatigue and decreased activity tolerance. (A) to maintain balance and manage RW  Gait Pattern:  (hop to ) Gait velocity: decreased due to pain and NWB status R LE Stairs: No Wheelchair Mobility Wheelchair Mobility: No         PT Diagnosis:    PT Problem List:   PT Treatment Interventions:     PT Goals (current goals can now be found in the care plan section) Acute Rehab PT Goals Patient Stated Goal: to go to [facility] before home  PT Goal Formulation: With patient Time For Goal Achievement: 05/17/13 Potential to Achieve Goals: Good  Visit Information  Last PT Received On: 05/11/13 Assistance Needed: +2 History of Present Illness: s/p R ankle fusion    Subjective Data  Subjective: pt sitting in chair; agreeable to therapy; states " i hope i do better than i did yesterday' Patient Stated Goal: to go to [place] before home    Cognition  Cognition Arousal/Alertness: Awake/alert Behavior During Therapy: WFL for tasks assessed/performed Overall Cognitive Status: Within Functional Limits for tasks assessed    Balance  Balance Balance Assessed: Yes Static Standing Balance Static Standing - Balance Support: Bilateral upper extremity supported;During functional activity Static Standing - Level of Assistance: 4: Min assist  End of Session PT - End of Session Equipment Utilized During Treatment: Gait belt;Other (comment) (post op shoe) Activity Tolerance: Patient tolerated treatment well Patient left: in chair;with call bell/phone within reach Nurse Communication: Mobility status   Mary  [name] [name],  [phone] 05/11/2013, 10:57 AM

## 2013-05-12 ENCOUNTER — Encounter (HOSPITAL_COMMUNITY): Payer: Self-pay | Admitting: Orthopedic Surgery

## 2013-05-12 DIAGNOSIS — J45909 Unspecified asthma, uncomplicated: Secondary | ICD-10-CM | POA: Diagnosis not present

## 2013-05-12 DIAGNOSIS — M6281 Muscle weakness (generalized): Secondary | ICD-10-CM | POA: Diagnosis not present

## 2013-05-12 DIAGNOSIS — R269 Unspecified abnormalities of gait and mobility: Secondary | ICD-10-CM | POA: Diagnosis not present

## 2013-05-12 DIAGNOSIS — L97509 Non-pressure chronic ulcer of other part of unspecified foot with unspecified severity: Secondary | ICD-10-CM | POA: Diagnosis not present

## 2013-05-12 DIAGNOSIS — Z4889 Encounter for other specified surgical aftercare: Secondary | ICD-10-CM | POA: Diagnosis not present

## 2013-05-12 DIAGNOSIS — K219 Gastro-esophageal reflux disease without esophagitis: Secondary | ICD-10-CM | POA: Diagnosis not present

## 2013-05-12 DIAGNOSIS — K2100 Gastro-esophageal reflux disease with esophagitis, without bleeding: Secondary | ICD-10-CM | POA: Diagnosis not present

## 2013-05-12 DIAGNOSIS — M545 Low back pain, unspecified: Secondary | ICD-10-CM | POA: Diagnosis not present

## 2013-05-12 DIAGNOSIS — K449 Diaphragmatic hernia without obstruction or gangrene: Secondary | ICD-10-CM | POA: Diagnosis not present

## 2013-05-12 DIAGNOSIS — R609 Edema, unspecified: Secondary | ICD-10-CM | POA: Diagnosis not present

## 2013-05-12 DIAGNOSIS — M8618 Other acute osteomyelitis, other site: Secondary | ICD-10-CM | POA: Diagnosis not present

## 2013-05-12 DIAGNOSIS — M869 Osteomyelitis, unspecified: Secondary | ICD-10-CM | POA: Diagnosis not present

## 2013-05-12 DIAGNOSIS — A5211 Tabes dorsalis: Secondary | ICD-10-CM | POA: Diagnosis not present

## 2013-05-12 DIAGNOSIS — M5430 Sciatica, unspecified side: Secondary | ICD-10-CM | POA: Diagnosis not present

## 2013-05-12 DIAGNOSIS — E119 Type 2 diabetes mellitus without complications: Secondary | ICD-10-CM | POA: Diagnosis not present

## 2013-05-12 DIAGNOSIS — E1169 Type 2 diabetes mellitus with other specified complication: Secondary | ICD-10-CM | POA: Diagnosis not present

## 2013-05-12 DIAGNOSIS — M5137 Other intervertebral disc degeneration, lumbosacral region: Secondary | ICD-10-CM | POA: Diagnosis not present

## 2013-05-12 DIAGNOSIS — M1990 Unspecified osteoarthritis, unspecified site: Secondary | ICD-10-CM | POA: Diagnosis not present

## 2013-05-12 DIAGNOSIS — L8992 Pressure ulcer of unspecified site, stage 2: Secondary | ICD-10-CM | POA: Diagnosis not present

## 2013-05-12 DIAGNOSIS — IMO0002 Reserved for concepts with insufficient information to code with codable children: Secondary | ICD-10-CM | POA: Diagnosis not present

## 2013-05-12 DIAGNOSIS — E1149 Type 2 diabetes mellitus with other diabetic neurological complication: Secondary | ICD-10-CM | POA: Diagnosis not present

## 2013-05-12 DIAGNOSIS — L97409 Non-pressure chronic ulcer of unspecified heel and midfoot with unspecified severity: Secondary | ICD-10-CM | POA: Diagnosis not present

## 2013-05-12 DIAGNOSIS — I10 Essential (primary) hypertension: Secondary | ICD-10-CM | POA: Diagnosis not present

## 2013-05-12 DIAGNOSIS — M48061 Spinal stenosis, lumbar region without neurogenic claudication: Secondary | ICD-10-CM | POA: Diagnosis not present

## 2013-05-12 DIAGNOSIS — Z79899 Other long term (current) drug therapy: Secondary | ICD-10-CM | POA: Diagnosis not present

## 2013-05-12 DIAGNOSIS — M79609 Pain in unspecified limb: Secondary | ICD-10-CM | POA: Diagnosis not present

## 2013-05-12 LAB — GLUCOSE, CAPILLARY
Glucose-Capillary: 126 mg/dL — ABNORMAL HIGH (ref 70–99)
Glucose-Capillary: 118 mg/dL — ABNORMAL HIGH (ref 70–99)

## 2013-05-12 MED ORDER — OXYCODONE-ACETAMINOPHEN 5-325 MG PO TABS: 1.0000 | ORAL_TABLET | ORAL | Status: DC | PRN

## 2013-05-12 NOTE — Discharge Summary (Signed)
  Patient with history of chronic UTI, present on admission

## 2013-05-12 NOTE — Clinical Documentation Improvement (Signed)
THIS DOCUMENT IS NOT A PERMANENT PART OF THE MEDICAL RECORD  Please update your documentation with the medical record to reflect your response to this query. If you need help knowing how to do this please call [phone].  05/12/13  To Nadara Mustard, MD / Associates   In a better effort to capture your patient's severity of illness, reflect appropriate length of stay and utilization of resources, a review of the patient medical record has revealed the following indicators.    Based on your clinical judgment, please clarify and document in a progress note and/or discharge summary the clinical condition associated with the following supporting information:  In responding to this query please exercise your independent judgment.  The fact that a query is asked, does not imply that any particular answer is desired or expected.  Medicare rules require specification as to whether an inpatient diagnosis was present at the time of admission.   Noted patient on "trimpex" for her "chronic UTI's".  Please clarify if the following diagnosis,             "Chronic UTI" , was:     _ Present at the time of admission  _ NOT present at the time of inpatient admission and it developed during the inpatient stay  _ Unable to clinically determine whether the condition was present on admission.  _  Documentation insufficient to determine if condition was present at the time of inpatient admission  You may use possible, probable, or suspect with inpatient documentation. possible, probable, suspected diagnoses MUST be documented at the time of discharge  Reviewed: additional documentation in the medical record      Thank Arrie [facility]  Clinical Documentation Specialist: [phone] Health Information Management [place]

## 2013-05-12 NOTE — Discharge Summary (Signed)
Physician Discharge Summary  Patient ID: [name] MRN: [id number] DOB/AGE: 1935/06/15 78 y.o.  Admit date: 05/09/2013 Discharge date: 05/12/2013  Admission Diagnoses: Charcot collapse right foot with osteomyelitis and ulceration  Discharge Diagnoses: Charcot collapse right foot with osteomyelitis and ulceration Active Problems:   * No active hospital problems. *   Discharged Condition: stable  Hospital Course: Patient's hospital course was essentially unremarkable. She underwent excision of the osteomyelitis reconstruction of the forefoot and total fixation local tissue rearrangement for wound closure as well as placement of antibiotic beads. Postoperatively patient required additional care and will require discharge to skilled nursing.  Consults: None  Significant Diagnostic Studies: labs: Routine labs  Treatments: surgery: See operative note  Discharge Exam: Blood pressure 96/42, pulse 74, temperature 98 F (36.7 C), temperature source Oral, resp. rate 14, height 5\' 6"  (1.676 m), weight 86.183 kg (190 lb), SpO2 100.00%. Incision/Wound: dressing clean dry and intact  Disposition: 03-Skilled Nursing Facility  Discharge Orders   Future Appointments Provider Department Dept Phone   06/03/2013 10:30 AM Wanda Plump, MD [facility]  [place] [phone]   Future Orders Complete By Expires     Call MD / Call 911  As directed     Comments:      If you experience chest pain or shortness of breath, CALL 911 and be transported to the hospital emergency room.  If you develope a fever above 101 F, pus (white drainage) or increased drainage or redness at the wound, or calf pain, call your surgeon's office.    Constipation Prevention  As directed     Comments:      Drink plenty of fluids.  Prune juice may be helpful.  You may use a stool softener, such as Colace (over the counter) 100 mg twice a day.  Use MiraLax (over the counter) for constipation as needed.    Diet  - low sodium heart healthy  As directed     Increase activity slowly as tolerated  As directed         Medication List         acetaminophen 500 MG tablet  Commonly known as:  TYLENOL  Take 500 mg by mouth daily as needed for pain.     Alpha-Lipoic Acid 600 MG Caps  Take 600 mg by mouth 2 (two) times daily.     amLODipine-benazepril 5-10 MG per capsule  Commonly known as:  LOTREL  Take 1 capsule by mouth every evening.     Boron 3 MG Caps  Take 3 mg by mouth every evening.     CINNAMON PO  Take 250 mg by mouth 2 (two) times daily.     diclofenac sodium 1 % Gel  Commonly known as:  VOLTAREN  Apply 1 application topically daily as needed (pain).     freestyle lancets  use twice a day     HYDROcodone-acetaminophen 10-325 MG per tablet  Commonly known as:  NORCO  Take 1 tablet by mouth 4 (four) times daily.     ibuprofen 200 MG tablet  Commonly known as:  ADVIL,MOTRIN  Take 400 mg by mouth daily as needed for pain.     Krill Oil 300 MG Caps  Take 300 mg by mouth every evening.     meclizine 12.5 MG tablet  Commonly known as:  ANTIVERT  Take 25 mg by mouth 2 (two) times daily as needed for nausea.     nabumetone 750 MG tablet  Commonly known  as:  RELAFEN  Take 750 mg by mouth 2 (two) times daily.     omeprazole 20 MG capsule  Commonly known as:  PRILOSEC  Take 20 mg by mouth daily.     OVER THE COUNTER MEDICATION  Take 1-2 capsules by mouth 2 (two) times daily. Glucose Essentials - 1 capsule every morning, 2 capsules every night     OVER THE COUNTER MEDICATION  Take 400 mg by mouth 2 (two) times daily. tumeric     OVER THE COUNTER MEDICATION  Take 1 capsule by mouth 2 (two) times daily. Integrative Digestive Formula     oxyCODONE-acetaminophen 5-325 MG per tablet  Commonly known as:  ROXICET  Take 1 tablet by mouth every 4 (four) hours as needed for pain.     PRESERVISION AREDS 2 Caps  Take 1 capsule by mouth 2 (two) times daily.     PROBIOTIC DAILY  PO  Take 1 capsule by mouth daily.     REFRESH OP  Place 1 drop into both eyes daily as needed (dry eyes).     trimethoprim 100 MG tablet  Commonly known as:  TRIMPEX  Take 100 mg by mouth at bedtime.     Vitamin B-12 2500 MCG Subl  Place 2,500 mcg under the tongue daily.     Vitamin D3 5000 UNITS Caps  Take 5,000 Units by mouth every evening.           Follow-up Information   Follow up with Miray Mancino V, MD In 2 weeks.   Contact information:   [address] [place] [address] [phone]       Signed: Nadara Mustard 05/12/2013, 7:00 AM

## 2013-05-12 NOTE — Progress Notes (Signed)
Pt discharged to [facility]. Report called to RN at [facility]. Pts IV was removed. Pt left unit in a stable condition via EMS.

## 2013-05-12 NOTE — Progress Notes (Signed)
UR COMPLETED  

## 2013-05-16 ENCOUNTER — Other Ambulatory Visit: Payer: Self-pay | Admitting: Geriatric Medicine

## 2013-05-16 MED ORDER — DIAZEPAM 2 MG PO TABS: ORAL_TABLET | ORAL | Status: DC

## 2013-06-03 ENCOUNTER — Encounter: Payer: Medicare Other | Admitting: Internal Medicine

## 2013-06-19 ENCOUNTER — Non-Acute Institutional Stay (SKILLED_NURSING_FACILITY): Payer: Medicare Other | Admitting: Internal Medicine

## 2013-06-19 DIAGNOSIS — J45909 Unspecified asthma, uncomplicated: Secondary | ICD-10-CM

## 2013-06-19 DIAGNOSIS — K449 Diaphragmatic hernia without obstruction or gangrene: Secondary | ICD-10-CM

## 2013-06-19 DIAGNOSIS — L97509 Non-pressure chronic ulcer of other part of unspecified foot with unspecified severity: Secondary | ICD-10-CM | POA: Diagnosis not present

## 2013-06-19 DIAGNOSIS — M869 Osteomyelitis, unspecified: Secondary | ICD-10-CM

## 2013-06-19 DIAGNOSIS — I10 Essential (primary) hypertension: Secondary | ICD-10-CM

## 2013-06-19 DIAGNOSIS — M9080 Osteopathy in diseases classified elsewhere, unspecified site: Secondary | ICD-10-CM

## 2013-06-19 DIAGNOSIS — E1169 Type 2 diabetes mellitus with other specified complication: Secondary | ICD-10-CM

## 2013-06-19 NOTE — Progress Notes (Signed)
PROGRESS NOTE  DATE: 06/19/2013  FACILITY: Nursing Home Location: [facility]  LEVEL OF CARE: SNF (31)  Routine Visit  CHIEF COMPLAINT:  Manage HTN, right foot osteomyelitis and asthma  HISTORY OF PRESENT ILLNESS:  REASSESSMENT OF ONGOING PROBLEM(S):  HTN: Pt 's HTN remains stable.  Denies CP, sob, DOE, headaches, dizziness or visual disturbances.  No complications from the medications currently being used.  Last BP : 126/64, 126/80, 125/63  RIGHT FOOT OSTEOMYELITIS: Patient is status post excision and reconstruction and placement of antibiotic beads. Currently she is undergoing rehabilitation. She reports no problems with therapy. She denies right foot pain. Her right lower extremity is in a brace now.  ASTHMA: The patient's asthma remains stable. Patient denies shortness of breath, dyspnea on exertion or wheezing. No complications reported from the medications currently being used.  PAST MEDICAL HISTORY : Reviewed.  No changes.  CURRENT MEDICATIONS: Reviewed per [facility]  REVIEW OF SYSTEMS:  GENERAL: no change in appetite, no fatigue, no weight changes, no fever, chills or weakness RESPIRATORY: no cough, SOB, DOE, wheezing, hemoptysis CARDIAC: no chest pain, or palpitations, has lower extremity swelling GI: no abdominal pain, diarrhea, constipation, heart burn, nausea or vomiting  PHYSICAL EXAMINATION  VS:  T 97       P 68      RR 16      BP 126/64     POX % 97     WT (Lb)  GENERAL: no acute distress, moderately obese body habitus EYES: conjunctivae normal, sclerae normal, normal eye lids NECK: supple, trachea midline, no neck masses, no thyroid tenderness, no thyromegaly LYMPHATICS: no LAN in the neck, no supraclavicular LAN RESPIRATORY: breathing is even & unlabored, BS CTAB CARDIAC: RRR, no murmur,no extra heart sounds, +2 bilateral lower extremity mediate edema, left lower extremity in the brace GI: abdomen soft, normal BS, no masses, no  tenderness, no hepatomegaly, no splenomegaly PSYCHIATRIC: the patient is alert & oriented to person, affect & behavior appropriate  LABS/RADIOLOGY: none  ASSESSMENT/PLAN:  Hypertension-well controlled Right foot osteomyelitis-status post reconstruction. continue rehabilitation Asthma-well compensated Hiatal hernia-continue Prilosec Osteoarthritis-denies pain Diabetes mellitus-diet controlled. Check hemoglobin A1c Check CBC and CMP  CPT CODE: [address]

## 2013-07-08 DIAGNOSIS — M545 Low back pain, unspecified: Secondary | ICD-10-CM | POA: Diagnosis not present

## 2013-07-08 DIAGNOSIS — M48061 Spinal stenosis, lumbar region without neurogenic claudication: Secondary | ICD-10-CM | POA: Diagnosis not present

## 2013-07-08 DIAGNOSIS — M5430 Sciatica, unspecified side: Secondary | ICD-10-CM | POA: Diagnosis not present

## 2013-07-10 ENCOUNTER — Other Ambulatory Visit: Payer: Self-pay | Admitting: *Deleted

## 2013-07-10 MED ORDER — OMEPRAZOLE 20 MG PO CPDR: 20.0000 mg | DELAYED_RELEASE_CAPSULE | Freq: Every day | ORAL | Status: DC

## 2013-07-16 ENCOUNTER — Non-Acute Institutional Stay (SKILLED_NURSING_FACILITY): Payer: Medicare Other | Admitting: Internal Medicine

## 2013-07-16 DIAGNOSIS — I10 Essential (primary) hypertension: Secondary | ICD-10-CM | POA: Diagnosis not present

## 2013-07-16 DIAGNOSIS — M869 Osteomyelitis, unspecified: Secondary | ICD-10-CM | POA: Diagnosis not present

## 2013-07-16 DIAGNOSIS — K449 Diaphragmatic hernia without obstruction or gangrene: Secondary | ICD-10-CM | POA: Diagnosis not present

## 2013-07-16 DIAGNOSIS — J45909 Unspecified asthma, uncomplicated: Secondary | ICD-10-CM | POA: Diagnosis not present

## 2013-07-16 NOTE — Progress Notes (Signed)
PROGRESS NOTE  DATE: 07-16-13  FACILITY: Nursing Home Location: [facility]  LEVEL OF CARE: SNF (31)  Routine Visit  CHIEF COMPLAINT:  Manage HTN, right foot osteomyelitis and asthma  HISTORY OF PRESENT ILLNESS:  REASSESSMENT OF ONGOING PROBLEM(S):  HTN: Pt 's HTN remains stable.  Denies CP, sob, DOE, headaches, dizziness or visual disturbances.  No complications from the medications currently being used.  Last BP : 126/64, 126/80, 125/63, 134/72  RIGHT FOOT OSTEOMYELITIS: Patient is status post excision and reconstruction and placement of antibiotic beads. Currently she is undergoing rehabilitation. She reports no problems with therapy. She denies right foot pain. Her right lower extremity is in a brace now.  ASTHMA: The patient's asthma remains stable. Patient denies shortness of breath, dyspnea on exertion or wheezing. No complications reported from the medications currently being used.  PAST MEDICAL HISTORY : Reviewed.  No changes.  CURRENT MEDICATIONS: Reviewed per [facility]  REVIEW OF SYSTEMS:  GENERAL: no change in appetite, no fatigue, no weight changes, no fever, chills or weakness RESPIRATORY: no cough, SOB, DOE, wheezing, hemoptysis CARDIAC: no chest pain, or palpitations, has lower extremity swelling GI: no abdominal pain, diarrhea, constipation, heart burn, nausea or vomiting  PHYSICAL EXAMINATION  VS:  T 97.4       P 84      RR 20      BP 134/72     POX % 98     WT (Lb)  GENERAL: no acute distress, moderately obese body habitus NECK: supple, trachea midline, no neck masses, no thyroid tenderness, no thyromegaly RESPIRATORY: breathing is even & unlabored, BS CTAB CARDIAC: RRR, no murmur,no extra heart sounds, +2 bilateral lower extremity mediate edema, left lower extremity in the brace GI: abdomen soft, normal BS, no masses, no tenderness, no hepatomegaly, no splenomegaly PSYCHIATRIC: the patient is alert & oriented to person, affect & behavior  appropriate  LABS/RADIOLOGY:   8-14 CMP normal, hemoglobin 10.3, MCV 92.7 otherwise CBC normal, hemoglobin A1c 5.7  ASSESSMENT/PLAN:  Hypertension-well controlled Right foot osteomyelitis-status post reconstruction. continue rehabilitation Asthma-well compensated Hiatal hernia-continue Prilosec Osteoarthritis-denies pain Diabetes mellitus-diet controlled.   CPT CODE: 16109

## 2013-07-17 DIAGNOSIS — L97509 Non-pressure chronic ulcer of other part of unspecified foot with unspecified severity: Secondary | ICD-10-CM | POA: Diagnosis not present

## 2013-07-21 ENCOUNTER — Telehealth: Payer: Self-pay | Admitting: Internal Medicine

## 2013-07-21 MED ORDER — OMEPRAZOLE 20 MG PO CPDR: 20.0000 mg | DELAYED_RELEASE_CAPSULE | Freq: Two times a day (BID) | ORAL | Status: DC

## 2013-07-21 NOTE — Telephone Encounter (Signed)
rx sent with correct dosage.

## 2013-07-23 DIAGNOSIS — R269 Unspecified abnormalities of gait and mobility: Secondary | ICD-10-CM | POA: Diagnosis not present

## 2013-07-23 DIAGNOSIS — M5137 Other intervertebral disc degeneration, lumbosacral region: Secondary | ICD-10-CM | POA: Diagnosis not present

## 2013-07-23 DIAGNOSIS — K219 Gastro-esophageal reflux disease without esophagitis: Secondary | ICD-10-CM | POA: Diagnosis not present

## 2013-07-23 DIAGNOSIS — M6281 Muscle weakness (generalized): Secondary | ICD-10-CM | POA: Diagnosis not present

## 2013-07-23 DIAGNOSIS — L8992 Pressure ulcer of unspecified site, stage 2: Secondary | ICD-10-CM | POA: Diagnosis not present

## 2013-07-23 DIAGNOSIS — R609 Edema, unspecified: Secondary | ICD-10-CM | POA: Diagnosis not present

## 2013-07-23 DIAGNOSIS — M8618 Other acute osteomyelitis, other site: Secondary | ICD-10-CM | POA: Diagnosis not present

## 2013-07-23 DIAGNOSIS — IMO0002 Reserved for concepts with insufficient information to code with codable children: Secondary | ICD-10-CM | POA: Diagnosis not present

## 2013-07-23 DIAGNOSIS — A5211 Tabes dorsalis: Secondary | ICD-10-CM | POA: Diagnosis not present

## 2013-07-23 DIAGNOSIS — Z4889 Encounter for other specified surgical aftercare: Secondary | ICD-10-CM | POA: Diagnosis not present

## 2013-07-23 DIAGNOSIS — L97409 Non-pressure chronic ulcer of unspecified heel and midfoot with unspecified severity: Secondary | ICD-10-CM | POA: Diagnosis not present

## 2013-07-23 DIAGNOSIS — I10 Essential (primary) hypertension: Secondary | ICD-10-CM | POA: Diagnosis not present

## 2013-07-23 DIAGNOSIS — E119 Type 2 diabetes mellitus without complications: Secondary | ICD-10-CM | POA: Diagnosis not present

## 2013-07-23 DIAGNOSIS — L97509 Non-pressure chronic ulcer of other part of unspecified foot with unspecified severity: Secondary | ICD-10-CM | POA: Diagnosis not present

## 2013-07-23 DIAGNOSIS — E1149 Type 2 diabetes mellitus with other diabetic neurological complication: Secondary | ICD-10-CM | POA: Diagnosis not present

## 2013-07-23 DIAGNOSIS — M1990 Unspecified osteoarthritis, unspecified site: Secondary | ICD-10-CM | POA: Diagnosis not present

## 2013-07-29 ENCOUNTER — Ambulatory Visit: Payer: Medicare Other | Admitting: Internal Medicine

## 2013-07-29 DIAGNOSIS — M5430 Sciatica, unspecified side: Secondary | ICD-10-CM | POA: Diagnosis not present

## 2013-07-29 DIAGNOSIS — M545 Low back pain, unspecified: Secondary | ICD-10-CM | POA: Diagnosis not present

## 2013-07-31 DIAGNOSIS — L97509 Non-pressure chronic ulcer of other part of unspecified foot with unspecified severity: Secondary | ICD-10-CM | POA: Diagnosis not present

## 2013-08-07 ENCOUNTER — Ambulatory Visit: Payer: Medicare Other | Admitting: Internal Medicine

## 2013-08-13 DIAGNOSIS — E1149 Type 2 diabetes mellitus with other diabetic neurological complication: Secondary | ICD-10-CM | POA: Diagnosis not present

## 2013-08-13 DIAGNOSIS — M5137 Other intervertebral disc degeneration, lumbosacral region: Secondary | ICD-10-CM | POA: Diagnosis not present

## 2013-08-14 ENCOUNTER — Other Ambulatory Visit: Payer: Self-pay | Admitting: *Deleted

## 2013-08-14 MED ORDER — OXYCODONE-ACETAMINOPHEN 5-325 MG PO TABS: 1.0000 | ORAL_TABLET | ORAL | Status: DC | PRN

## 2013-08-15 ENCOUNTER — Non-Acute Institutional Stay (SKILLED_NURSING_FACILITY): Payer: Medicare Other | Admitting: Adult Health

## 2013-08-15 DIAGNOSIS — E119 Type 2 diabetes mellitus without complications: Secondary | ICD-10-CM

## 2013-08-15 DIAGNOSIS — K219 Gastro-esophageal reflux disease without esophagitis: Secondary | ICD-10-CM

## 2013-08-15 DIAGNOSIS — M1990 Unspecified osteoarthritis, unspecified site: Secondary | ICD-10-CM

## 2013-08-15 DIAGNOSIS — A5211 Tabes dorsalis: Secondary | ICD-10-CM

## 2013-08-15 DIAGNOSIS — M14671 Charcot's joint, right ankle and foot: Secondary | ICD-10-CM

## 2013-08-15 DIAGNOSIS — I10 Essential (primary) hypertension: Secondary | ICD-10-CM

## 2013-08-18 ENCOUNTER — Telehealth: Payer: Self-pay | Admitting: Internal Medicine

## 2013-08-18 MED ORDER — HYDROCODONE-ACETAMINOPHEN 10-325 MG PO TABS: 1.0000 | ORAL_TABLET | Freq: Four times a day (QID) | ORAL | Status: DC | PRN

## 2013-08-18 NOTE — Telephone Encounter (Signed)
Done. DJR  

## 2013-08-18 NOTE — Addendum Note (Signed)
Addended by: [name] E on: 08/18/2013 02:24 PM   Modules accepted: Orders

## 2013-08-18 NOTE — Telephone Encounter (Signed)
Call pts daughter @ pts home # [phone] when ready for pick up.

## 2013-08-18 NOTE — Telephone Encounter (Signed)
rx request- hydrocodone 10-325mg  Last OV- 01/31/13 UDS-low 01/31/13  Please advise. DJR

## 2013-08-18 NOTE — Telephone Encounter (Addendum)
The patient recently had surgery, has been at a rehabilitation facility. I see in her medication list she also takes oxycodone. She's probably now home. Call patient: It refill hydrocodone, do not mix with other pain killers She is due for a checkup, please arrange.

## 2013-08-18 NOTE — Telephone Encounter (Signed)
Patient daughter called in a refill request for HYDROcodone-acetaminophen (NORCO) 10-325 MG per tablet for her mother. thanks

## 2013-08-20 DIAGNOSIS — IMO0002 Reserved for concepts with insufficient information to code with codable children: Secondary | ICD-10-CM | POA: Diagnosis not present

## 2013-08-20 DIAGNOSIS — E1149 Type 2 diabetes mellitus with other diabetic neurological complication: Secondary | ICD-10-CM | POA: Diagnosis not present

## 2013-08-21 DIAGNOSIS — Z79899 Other long term (current) drug therapy: Secondary | ICD-10-CM | POA: Diagnosis not present

## 2013-08-21 DIAGNOSIS — M1460 Charcot's joint, unspecified site: Secondary | ICD-10-CM | POA: Insufficient documentation

## 2013-08-21 DIAGNOSIS — M5137 Other intervertebral disc degeneration, lumbosacral region: Secondary | ICD-10-CM | POA: Diagnosis not present

## 2013-08-21 DIAGNOSIS — M47817 Spondylosis without myelopathy or radiculopathy, lumbosacral region: Secondary | ICD-10-CM | POA: Diagnosis not present

## 2013-08-21 NOTE — Progress Notes (Addendum)
Patient ID: [name], female   DOB: 02/06/1935, 78 y.o.   MRN: [id number]       PROGRESS NOTE  DATE: 08/15/2013   FACILITY: [facility]  LEVEL OF CARE: SNF (31)  Acute Visit  CHIEF COMPLAINT: Discharge Notes  HISTORY OF PRESENT ILLNESS: This is a 78 year old female who is for discharge home with Home health PT, OT and Nursing. She has been admitted to [facility] on 05/12/13 from [facility] with Charcot collapse right foot with osteomyelitis and ulceration. S/P reconstruction. Patient was admitted to this facility for short-term rehabilitation after the patient's recent hospitalization.  Patient has completed SNF rehabilitation and therapy has cleared the patient for discharge.  Reassessment of ongoing problem(s):  HTN: Pt 's HTN remains stable.  Denies CP, sob, DOE, pedal edema, headaches, dizziness or visual disturbances.  No complications from the medications currently being used.  Last BP : 130/70  DM:pt's DM remains stable.  Pt denies polyuria, polydipsia, polyphagia, changes in vision or hypoglycemic episodes.  No medication presently being used.   8/14 hemoglobin A1c 5.7  GERD: pt's GERD is stable.  Denies ongoing heartburn, abd. Pain, nausea or vomiting.  Currently on a PPI & tolerates it without any adverse reactions.   PAST MEDICAL HISTORY : Reviewed.  No changes.  CURRENT MEDICATIONS: Reviewed per [facility]  REVIEW OF SYSTEMS:  GENERAL: no change in appetite, no fatigue, no weight changes, no fever, chills or weakness RESPIRATORY: no cough, SOB, DOE, wheezing, hemoptysis CARDIAC: no chest pain, edema or palpitations GI: no abdominal pain, diarrhea, constipation, heart burn, nausea or vomiting  PHYSICAL EXAMINATION  VS:  T97.6       P82       RR18      BP130/70      POX98 %       WT205.8 (Lb)  GENERAL: no acute distress, normal body habitus EYES: conjunctivae normal, sclerae normal, normal eye lids NECK: supple, trachea midline, no neck  masses, no thyroid tenderness, no thyromegaly LYMPHATICS: no LAN in the neck, no supraclavicular LAN RESPIRATORY: breathing is even & unlabored, BS CTAB CARDIAC: RRR, no murmur,no extra heart sounds, no edema GI: abdomen soft, normal BS, no masses, no tenderness, no hepatomegaly, no splenomegaly PSYCHIATRIC: the patient is alert & oriented to person, affect & behavior appropriate  LABS/RADIOLOGY: 8-14 CMP normal, hemoglobin 10.3, MCV 92.7 otherwise CBC normal, hemoglobin A1c 5.7   ASSESSMENT/PLAN:  Charcot collapse right foot status post reconstruction - for  Home health PT, OT and nursing  Hypertension - well controlled  Diabetes mellitus, type II - diet controlled  Osteoarthritis - stable    I have filled out patient's discharge paperwork and written prescriptions.  Patient will receive home health PT, OT and Nursing..  Total discharge time: Less than 30 minutes Discharge time involved coordination of the discharge process with social worker, nursing staff and therapy department. Medical justification for home health services verified.  CPT CODE: 40981

## 2013-08-22 ENCOUNTER — Ambulatory Visit: Payer: Medicare Other | Admitting: Internal Medicine

## 2013-08-26 DIAGNOSIS — M9080 Osteopathy in diseases classified elsewhere, unspecified site: Secondary | ICD-10-CM | POA: Diagnosis not present

## 2013-08-26 DIAGNOSIS — L97509 Non-pressure chronic ulcer of other part of unspecified foot with unspecified severity: Secondary | ICD-10-CM | POA: Diagnosis not present

## 2013-08-26 DIAGNOSIS — E1142 Type 2 diabetes mellitus with diabetic polyneuropathy: Secondary | ICD-10-CM | POA: Diagnosis not present

## 2013-08-26 DIAGNOSIS — E1169 Type 2 diabetes mellitus with other specified complication: Secondary | ICD-10-CM | POA: Diagnosis not present

## 2013-08-26 DIAGNOSIS — E1149 Type 2 diabetes mellitus with other diabetic neurological complication: Secondary | ICD-10-CM | POA: Diagnosis not present

## 2013-08-26 DIAGNOSIS — T8189XA Other complications of procedures, not elsewhere classified, initial encounter: Secondary | ICD-10-CM | POA: Diagnosis not present

## 2013-08-29 DIAGNOSIS — E1149 Type 2 diabetes mellitus with other diabetic neurological complication: Secondary | ICD-10-CM | POA: Diagnosis not present

## 2013-08-29 DIAGNOSIS — T8189XA Other complications of procedures, not elsewhere classified, initial encounter: Secondary | ICD-10-CM | POA: Diagnosis not present

## 2013-08-29 DIAGNOSIS — E1169 Type 2 diabetes mellitus with other specified complication: Secondary | ICD-10-CM | POA: Diagnosis not present

## 2013-08-29 DIAGNOSIS — E1142 Type 2 diabetes mellitus with diabetic polyneuropathy: Secondary | ICD-10-CM | POA: Diagnosis not present

## 2013-08-29 DIAGNOSIS — M9080 Osteopathy in diseases classified elsewhere, unspecified site: Secondary | ICD-10-CM | POA: Diagnosis not present

## 2013-09-02 DIAGNOSIS — E1169 Type 2 diabetes mellitus with other specified complication: Secondary | ICD-10-CM | POA: Diagnosis not present

## 2013-09-02 DIAGNOSIS — M9080 Osteopathy in diseases classified elsewhere, unspecified site: Secondary | ICD-10-CM | POA: Diagnosis not present

## 2013-09-02 DIAGNOSIS — E1149 Type 2 diabetes mellitus with other diabetic neurological complication: Secondary | ICD-10-CM | POA: Diagnosis not present

## 2013-09-02 DIAGNOSIS — E1142 Type 2 diabetes mellitus with diabetic polyneuropathy: Secondary | ICD-10-CM | POA: Diagnosis not present

## 2013-09-02 DIAGNOSIS — T8189XA Other complications of procedures, not elsewhere classified, initial encounter: Secondary | ICD-10-CM | POA: Diagnosis not present

## 2013-09-05 DIAGNOSIS — E1149 Type 2 diabetes mellitus with other diabetic neurological complication: Secondary | ICD-10-CM | POA: Diagnosis not present

## 2013-09-05 DIAGNOSIS — T8189XA Other complications of procedures, not elsewhere classified, initial encounter: Secondary | ICD-10-CM | POA: Diagnosis not present

## 2013-09-05 DIAGNOSIS — M9080 Osteopathy in diseases classified elsewhere, unspecified site: Secondary | ICD-10-CM | POA: Diagnosis not present

## 2013-09-05 DIAGNOSIS — E1142 Type 2 diabetes mellitus with diabetic polyneuropathy: Secondary | ICD-10-CM | POA: Diagnosis not present

## 2013-09-05 DIAGNOSIS — E1169 Type 2 diabetes mellitus with other specified complication: Secondary | ICD-10-CM | POA: Diagnosis not present

## 2013-09-09 DIAGNOSIS — M9080 Osteopathy in diseases classified elsewhere, unspecified site: Secondary | ICD-10-CM | POA: Diagnosis not present

## 2013-09-09 DIAGNOSIS — E1149 Type 2 diabetes mellitus with other diabetic neurological complication: Secondary | ICD-10-CM | POA: Diagnosis not present

## 2013-09-09 DIAGNOSIS — E1142 Type 2 diabetes mellitus with diabetic polyneuropathy: Secondary | ICD-10-CM | POA: Diagnosis not present

## 2013-09-09 DIAGNOSIS — T8189XA Other complications of procedures, not elsewhere classified, initial encounter: Secondary | ICD-10-CM | POA: Diagnosis not present

## 2013-09-09 DIAGNOSIS — E1169 Type 2 diabetes mellitus with other specified complication: Secondary | ICD-10-CM | POA: Diagnosis not present

## 2013-09-11 DIAGNOSIS — T8189XA Other complications of procedures, not elsewhere classified, initial encounter: Secondary | ICD-10-CM | POA: Diagnosis not present

## 2013-09-11 DIAGNOSIS — E1169 Type 2 diabetes mellitus with other specified complication: Secondary | ICD-10-CM | POA: Diagnosis not present

## 2013-09-11 DIAGNOSIS — E1142 Type 2 diabetes mellitus with diabetic polyneuropathy: Secondary | ICD-10-CM | POA: Diagnosis not present

## 2013-09-11 DIAGNOSIS — E1149 Type 2 diabetes mellitus with other diabetic neurological complication: Secondary | ICD-10-CM | POA: Diagnosis not present

## 2013-09-11 DIAGNOSIS — M9080 Osteopathy in diseases classified elsewhere, unspecified site: Secondary | ICD-10-CM | POA: Diagnosis not present

## 2013-09-16 ENCOUNTER — Telehealth: Payer: Self-pay

## 2013-09-16 ENCOUNTER — Telehealth: Payer: Self-pay | Admitting: Internal Medicine

## 2013-09-16 NOTE — Telephone Encounter (Signed)
Thank you , i'll see her tomorrow

## 2013-09-16 NOTE — Telephone Encounter (Signed)
Medication and allergies: reviewed and updated  90 day supply/mail order: na Local pharmacy: [facility] [address]   Immunizations due:  Shingles and Tdap  A/P:   No changes to FH or PSH(noted in chart) Spoke with daughter--flu vaccine was given in Rehab--will confirm and bring info to visit [facility]--2010--Dr Brodie--polyps--next due--2015  To Discuss with Provider: Daughter will be bringing patient to the appointment Out of Rehab but still recovering

## 2013-09-16 NOTE — Telephone Encounter (Signed)
Angelique Blonder, patient's daughter is calling in regards to request that Dr. Drue Novel consider giving her Mom an anti-depressant. She states that the patient lost her own mother in July and was unable to go to the [place] service because of her surgery which was the following day. Patient also had to be in a rehab facility for 4 months after surgery on her foot. Angelique Blonder thinks that she thinks she patient could use an anti-depressant with all that has went on and wants to notify Dr. Drue Novel because she will not be able to accompany her Mom at her appointment tomorrow morning at 9:30am.

## 2013-09-17 ENCOUNTER — Ambulatory Visit (INDEPENDENT_AMBULATORY_CARE_PROVIDER_SITE_OTHER): Payer: Medicare Other | Admitting: Internal Medicine

## 2013-09-17 ENCOUNTER — Telehealth: Payer: Self-pay | Admitting: Internal Medicine

## 2013-09-17 ENCOUNTER — Encounter: Payer: Self-pay | Admitting: Internal Medicine

## 2013-09-17 VITALS — BP 130/80 | HR 85 | Temp 97.9°F | Wt 205.0 lb

## 2013-09-17 DIAGNOSIS — I10 Essential (primary) hypertension: Secondary | ICD-10-CM

## 2013-09-17 DIAGNOSIS — R609 Edema, unspecified: Secondary | ICD-10-CM

## 2013-09-17 DIAGNOSIS — M545 Low back pain, unspecified: Secondary | ICD-10-CM | POA: Diagnosis not present

## 2013-09-17 DIAGNOSIS — R600 Localized edema: Secondary | ICD-10-CM

## 2013-09-17 DIAGNOSIS — M1710 Unilateral primary osteoarthritis, unspecified knee: Secondary | ICD-10-CM | POA: Diagnosis not present

## 2013-09-17 DIAGNOSIS — M899 Disorder of bone, unspecified: Secondary | ICD-10-CM

## 2013-09-17 DIAGNOSIS — Z0000 Encounter for general adult medical examination without abnormal findings: Secondary | ICD-10-CM

## 2013-09-17 DIAGNOSIS — E785 Hyperlipidemia, unspecified: Secondary | ICD-10-CM | POA: Diagnosis not present

## 2013-09-17 DIAGNOSIS — G9050 Complex regional pain syndrome I, unspecified: Secondary | ICD-10-CM

## 2013-09-17 DIAGNOSIS — F32A Depression, unspecified: Secondary | ICD-10-CM | POA: Insufficient documentation

## 2013-09-17 DIAGNOSIS — J984 Other disorders of lung: Secondary | ICD-10-CM | POA: Diagnosis not present

## 2013-09-17 DIAGNOSIS — E119 Type 2 diabetes mellitus without complications: Secondary | ICD-10-CM

## 2013-09-17 DIAGNOSIS — Z23 Encounter for immunization: Secondary | ICD-10-CM

## 2013-09-17 DIAGNOSIS — E0590 Thyrotoxicosis, unspecified without thyrotoxic crisis or storm: Secondary | ICD-10-CM

## 2013-09-17 DIAGNOSIS — F3289 Other specified depressive episodes: Secondary | ICD-10-CM

## 2013-09-17 DIAGNOSIS — M5137 Other intervertebral disc degeneration, lumbosacral region: Secondary | ICD-10-CM | POA: Diagnosis not present

## 2013-09-17 DIAGNOSIS — M47817 Spondylosis without myelopathy or radiculopathy, lumbosacral region: Secondary | ICD-10-CM | POA: Diagnosis not present

## 2013-09-17 DIAGNOSIS — F329 Major depressive disorder, single episode, unspecified: Secondary | ICD-10-CM

## 2013-09-17 DIAGNOSIS — M8580 Other specified disorders of bone density and structure, unspecified site: Secondary | ICD-10-CM

## 2013-09-17 LAB — CBC WITH DIFFERENTIAL/PLATELET
Basophils Absolute: 0 10*3/uL (ref 0.0–0.1)
Basophils Relative: 0.4 % (ref 0.0–3.0)
Eosinophils Absolute: 0.3 10*3/uL (ref 0.0–0.7)
Eosinophils Relative: 4.3 % (ref 0.0–5.0)
HCT: 37 % (ref 36.0–46.0)
Hemoglobin: 12.1 g/dL (ref 12.0–15.0)
Lymphocytes Relative: 29.6 % (ref 12.0–46.0)
Lymphs Abs: 2.3 10*3/uL (ref 0.7–4.0)
MCHC: 32.8 g/dL (ref 30.0–36.0)
MCV: 83.1 fl (ref 78.0–100.0)
Monocytes Absolute: 0.4 10*3/uL (ref 0.1–1.0)
Monocytes Relative: 5.2 % (ref 3.0–12.0)
Neutro Abs: 4.7 10*3/uL (ref 1.4–7.7)
Neutrophils Relative %: 60.5 % (ref 43.0–77.0)
Platelets: 240 10*3/uL (ref 150.0–400.0)
RBC: 4.45 Mil/uL (ref 3.87–5.11)
RDW: 15.1 % — ABNORMAL HIGH (ref 11.5–14.6)
WBC: 7.7 10*3/uL (ref 4.5–10.5)

## 2013-09-17 LAB — COMPREHENSIVE METABOLIC PANEL
ALT: 12 U/L (ref 0–35)
AST: 17 U/L (ref 0–37)
Albumin: 4 g/dL (ref 3.5–5.2)
Alkaline Phosphatase: 73 U/L (ref 39–117)
BUN: 13 mg/dL (ref 6–23)
CO2: 29 mEq/L (ref 19–32)
Calcium: 9.1 mg/dL (ref 8.4–10.5)
Chloride: 99 mEq/L (ref 96–112)
Creatinine, Ser: 1 mg/dL (ref 0.4–1.2)
GFR: 60.02 mL/min (ref >60.00)
Glucose, Bld: 128 mg/dL — ABNORMAL HIGH (ref 70–99)
Potassium: 3.7 mEq/L (ref 3.5–5.1)
Sodium: 138 mEq/L (ref 135–145)
Total Bilirubin: 0.8 mg/dL (ref 0.3–1.2)
Total Protein: 6.9 g/dL (ref 6.0–8.3)

## 2013-09-17 LAB — TSH: TSH: 1.91 u[IU]/mL (ref 0.35–5.50)

## 2013-09-17 LAB — LIPID PANEL
Cholesterol: 239 mg/dL — ABNORMAL HIGH (ref 0–200)
HDL: 39.3 mg/dL (ref >39.00)
Total CHOL/HDL Ratio: 6
Triglycerides: 478 mg/dL — ABNORMAL HIGH (ref 0.0–149.0)
VLDL: 95.6 mg/dL — ABNORMAL HIGH (ref 0.0–40.0)

## 2013-09-17 LAB — HEMOGLOBIN A1C: Hgb A1c MFr Bld: 6.5 % (ref 4.6–6.5)

## 2013-09-17 LAB — LDL CHOLESTEROL, DIRECT: Direct LDL: 117.9 mg/dL

## 2013-09-17 MED ORDER — HYDROCODONE-ACETAMINOPHEN 10-325 MG PO TABS: 1.0000 | ORAL_TABLET | Freq: Four times a day (QID) | ORAL | Status: DC | PRN

## 2013-09-17 MED ORDER — ESCITALOPRAM OXALATE 5 MG PO TABS: 5.0000 mg | ORAL_TABLET | Freq: Every day | ORAL | Status: DC

## 2013-09-17 NOTE — Assessment & Plan Note (Addendum)
although screening for depression was negative, on further questioning she does feel depressed, see history of present illness. She is leaving to [place] soon, Will stay there for a few weeks . Plan: extensice counseling here at the office  Lexapro 5 mg one by mouth daily #30 and one refill. Advise patient to call me in 2 - 3 weeks, let me know how she's doing and we'll consider increase dose to 10 mg.

## 2013-09-17 NOTE — Assessment & Plan Note (Signed)
Continue calcium, vitamin D and  will need a bone density test in few months

## 2013-09-17 NOTE — Assessment & Plan Note (Signed)
Seems  well-controlled, no change, check a CMP

## 2013-09-17 NOTE — Assessment & Plan Note (Signed)
TSH stable for years, recheck today

## 2013-09-17 NOTE — Assessment & Plan Note (Addendum)
Td today  Had a a flu shot already  2011 pneumonia shot  Had a shingles shot  Palpable aorta, u/s 10-2011 neg for AAA  HX of Malignant Colon Polyp in 1994, colonoscopy again 8-10, had a biopsy. Next 2015  saw gynecologist in 2010, encouraged to go back  Last mammogram 10-2011, missed this year's MMG Diet and exercise discussed

## 2013-09-17 NOTE — Assessment & Plan Note (Signed)
Per chart review: Pulmonary Nodule , incidental per CT: PET scan 4-9: likely  benign, CT 08-2009 no change, no further CTs( Dr Shelle Iron)

## 2013-09-17 NOTE — Assessment & Plan Note (Addendum)
Patient with h/o RSD, neuropathy, DJD. Currently taking hydrocodone 10 mg every 6 hours, occasionally takes oxycodone for breakthrough pain. Does not take Tylenol often. Takes Advil maybe once a month. Given the number of problems I think she needs to see pain management. She is leaving town and coming back in few weeks. Plan:  Refill Hydrocodone 10 mg #120 Pain management referral once she is back in town, will place a referral and ask her to be seen by January Avoid excessive in Tylenol

## 2013-09-17 NOTE — Telephone Encounter (Signed)
Patient daughter called and stated that her mother would like her referral for the pain clinic to be scheduled sometime after December but it was okay to go ahead and start the process.

## 2013-09-17 NOTE — Patient Instructions (Signed)
Get your blood work before you leave  Next visit in 2 months  for a  follow up (30 minutes) No Fasting Please make an appointment    Start Lexapro 5 mg one tablet daily for depression, call in 2 or 3 weeks and let me know how you're doing. do not take more than 3 grams  of acetaminophen (Tylenol) a day, keep in mind that hydrocodone has Tylenol on it   Fall Prevention and Home Safety Falls cause injuries and can affect all age groups. It is possible to use preventive measures to significantly decrease the likelihood of falls. There are many simple measures which can make your home safer and prevent falls. OUTDOORS  Repair cracks and edges of walkways and driveways.  Remove high doorway thresholds.  Trim shrubbery on the main path into your home.  Have good outside lighting.  Clear walkways of tools, rocks, debris, and clutter.  Check that handrails are not broken and are securely fastened. Both sides of steps should have handrails.  Have leaves, snow, and ice cleared regularly.  Use sand or salt on walkways during winter months.  In the garage, clean up grease or oil spills. BATHROOM  Install night lights.  Install grab bars by the toilet and in the tub and shower.  Use non-skid mats or decals in the tub or shower.  Place a plastic non-slip stool in the shower to sit on, if needed.  Keep floors dry and clean up all water on the floor immediately.  Remove soap buildup in the tub or shower on a regular basis.  Secure bath mats with non-slip, double-sided rug tape.  Remove throw rugs and tripping hazards from the floors. BEDROOMS  Install night lights.  Make sure a bedside light is easy to reach.  Do not use oversized bedding.  Keep a telephone by your bedside.  Have a firm chair with side arms to use for getting dressed.  Remove throw rugs and tripping hazards from the floor. KITCHEN  Keep handles on pots and pans turned toward the center of the stove. Use  back burners when possible.  Clean up spills quickly and allow time for drying.  Avoid walking on wet floors.  Avoid hot utensils and knives.  Position shelves so they are not too high or low.  Place commonly used objects within easy reach.  If necessary, use a sturdy step stool with a grab bar when reaching.  Keep electrical cables out of the way.  Do not use floor polish or wax that makes floors slippery. If you must use wax, use non-skid floor wax.  Remove throw rugs and tripping hazards from the floor. STAIRWAYS  Never leave objects on stairs.  Place handrails on both sides of stairways and use them. Fix any loose handrails. Make sure handrails on both sides of the stairways are as long as the stairs.  Check carpeting to make sure it is firmly attached along stairs. Make repairs to worn or loose carpet promptly.  Avoid placing throw rugs at the top or bottom of stairways, or properly secure the rug with carpet tape to prevent slippage. Get rid of throw rugs, if possible.  Have an electrician put in a light switch at the top and bottom of the stairs. OTHER FALL PREVENTION TIPS  Wear low-heel or rubber-soled shoes that are supportive and fit well. Wear closed toe shoes.  When using a stepladder, make sure it is fully opened and both spreaders are firmly locked. Do not  climb a closed stepladder.  Add color or contrast paint or tape to grab bars and handrails in your home. Place contrasting color strips on first and last steps.  Learn and use mobility aids as needed. Install an electrical emergency response system.  Turn on lights to avoid dark areas. Replace light bulbs that burn out immediately. Get light switches that glow.  Arrange furniture to create clear pathways. Keep furniture in the same place.  Firmly attach carpet with non-skid or double-sided tape.  Eliminate uneven floor surfaces.  Select a carpet pattern that does not visually hide the edge of  steps.  Be aware of all pets. OTHER HOME SAFETY TIPS  Set the water temperature for 120 F (48.8 C).  Keep emergency numbers on or near the telephone.  Keep smoke detectors on every level of the home and near sleeping areas. Document Released: 09/29/2002 Document Revised: 04/09/2012 Document Reviewed: 12/29/2011 [facility] Patient Information 2014 [place].

## 2013-09-17 NOTE — Assessment & Plan Note (Signed)
Reports that while she was in rehabilitation she developed bilateral lower extremity edema, ultrasound was negative for DVT, edema is back to baseline

## 2013-09-17 NOTE — Progress Notes (Signed)
Subjective:    Patient ID: [name], female    DOB: [birth year], 78 y.o.   MRN: [id number]  HPI Here for Medicare AWV:  1. Risk factors based on Past M, S, F history: reviewed  2. Physical Activities: sedentary since fall 07-2011  3. Depression/mood: see phone note from yesterday and a/p  4. Hearing: No problemss noted or reported  5. ADL's: Independent,  not driving since ~ 7829 6. Fall Risk: mechanical fell 02-2013, precautions discussed  7. home Safety: does feelsafe at home  8. Height, weight, &visual acuity: see VS, no problems w/ vision  9. Counseling: provided  10. Labs ordered based on risk factors: if needed  11. Referral Coordination: if needed  12. Care Plan, see assessment and plan  13. Cognitive Assessment: cognition normal, motor skills diminished , see PMH   We also discussed the following Depression, lost mother few months ago, was unable to attend funeral, has not reached for counseling and is recommended to do that.Denies suicidal ideas. Hypertension, good medication compliance. Diabetes--CBGs were temporarily increased low taking prednisone but now are back to a more normal level at around 120. History of chronic UTIs, on antibiotics as prescribed by urology. Pain management\see assessment and plan: This issue was extensively discussed.  Past Medical History: Hypertension Hyperlipidemia Diabetes PE and DVT 12-2007, after neck surgery, CT chest 10-09 neg for PE , coumadin d/c 10-2008, has a green filter stress test abnormal 2007, but  normal 03-2009 GI PROBLEMS Large hiatal hernia repair 01/25/10 h/o IBS Hemorrhoids HX of Malignant Colon Polyp in 1994, colonoscopy again 8-10, had a biopsy. Next 2015 --------------------------------------------- ?asthma (SOB when attempted  to d/c singulair 11-2008) Pulmonary Nodule , incidental per CT: PET scan 4-9: likely  benign, CT 08-2009 no change, no further CTs( Dr Shelle Iron) RSD  and Neuropathy, sees Dr  Sandria Manly DJD--chronic ankle pain, orthopedic surgeon Dr.Duda Osteopenia h/ o +PPD early macular degeneration June 2010 (Dr Earlene Plater)   Past Surgical History  Procedure Laterality Date  . Hiatal hernia repair  01/25/10    with Nissen Fundaplication  . Cholecystectomy    . Total abdominal hysterectomy w/ bilateral salpingoophorectomy    . Cataract extraction    . Rotator cuff repair  07/2007    Dr. Eulah Pont  . Cervical spine surgery  12/18/07    For OA, fusion. Dr. Ophelia Charter:  fu by a PE  . Toe amputation  08/2008    3rd right toe, Dr. Lajoyce Corners due to osteomyelitis  . Hand surgery  09/27/11    Left Hand  . Nasal septum surgery    . Vein ligation  1967  . Carpal tunnel  09/2011  . Amputation  03/06/2012    Procedure: AMPUTATION FOOT;  Surgeon: Nadara Mustard, MD;  Location: [facility] OR;  Service: Orthopedics;  Laterality: Left;  FIFTH RAY AMPUTATION   . Green filter       due to blood clots,   . Ankle fusion  09/27/2012    Procedure: ANKLE FUSION;  Surgeon: Nadara Mustard, MD;  Location: [facility] OR;  Service: Orthopedics;  Laterality: Left;  Left Tibiocalcaneal Fusion  . Cardiac catheterization      Hx: of august 2011 at [facility]  . Ankle fusion Right 05/09/2013    Procedure: ANKLE FUSION;  Surgeon: Nadara Mustard, MD;  Location: [facility] OR;  Service: Orthopedics;  Laterality: Right;  Excision Osteomyelitis Base 1st MT Right Foot, Fusion Medial Column   History   Social History  .  Marital Status: Divorced    Spouse Name: N/A    Number of Children: 3  . Years of Education: N/A   Occupational History  . Retired      from Engineer, site    Social History Main Topics  . Smoking status: Never Smoker   . Smokeless tobacco: Never Used  . Alcohol Use: No  . Drug Use: No  . Sexual Activity: No   Other Topics Concern  . Not on file   Social History Narrative   1 daughter lives w/ her , another daughter lives in town, 1 son in [place]             Family History  Problem Relation Age of Onset  . Migraines       headaches  . Hypertension    . Heart disease Mother     mitral valve replaced  . Rheum arthritis Mother   . Colon cancer Neg Hx   . Diabetic kidney disease Daughter   . Breast cancer        Review of Systems No chest pain, occasional shortness of breath. No nausea vomiting, no blood in the stools. Occasional diarrhea. Had bilateral lower extremity edema while  on rehabilitation, ultrasound was negative for DVT, symptoms are back to baseline.     Objective:   Physical Exam BP 130/80  Pulse 85  Temp(Src) 97.9 F (36.6 C)  Wt 205 lb (92.987 kg)  SpO2 97% General -- alert, well-developed, NAD.  Neck --no thyromegaly  Lungs -- normal respiratory effort, no intercostal retractions, no accessory muscle use, and normal breath sounds.  Heart-- normal rate, regular rhythm, no murmur.  Abdomen-- Not distended, good bowel sounds,soft, non-tender. No mass, Ao not palpable today Extremities--  Symmetric +/+++ pretibial edema   Neurologic--  alert & oriented X3. Speech normal, gait normal, strength normal in all extremities.  Psych-- Cognition and judgment appear intact. Cooperative with normal attention span and concentration. No anxious appearing , mild  depressed appearing.      Assessment & Plan:  Today , in addition to her physical I spent more than 30 min with the patient, >50% of the time counseling, and reviewing the chart

## 2013-09-17 NOTE — Assessment & Plan Note (Signed)
On diet control, at some point in the recent months she took prednisone and CBGs increase, currently ambulatory CBGs are ~ 120. Plan: A1c

## 2013-09-17 NOTE — Assessment & Plan Note (Signed)
labs

## 2013-09-17 NOTE — Progress Notes (Signed)
Pre visit review using our clinic review tool, if applicable. No additional management support is needed unless otherwise documented below in the visit note. 

## 2013-09-19 ENCOUNTER — Encounter: Payer: Self-pay | Admitting: *Deleted

## 2013-09-19 DIAGNOSIS — Z79899 Other long term (current) drug therapy: Secondary | ICD-10-CM | POA: Diagnosis not present

## 2013-09-19 NOTE — Telephone Encounter (Signed)
Advise theim, referral has been entered to be shedule after December

## 2013-09-19 NOTE — Telephone Encounter (Signed)
Done. DJR  

## 2013-09-22 ENCOUNTER — Telehealth: Payer: Self-pay | Admitting: Internal Medicine

## 2013-09-22 ENCOUNTER — Ambulatory Visit (INDEPENDENT_AMBULATORY_CARE_PROVIDER_SITE_OTHER): Payer: Medicare Other | Admitting: Internal Medicine

## 2013-09-22 ENCOUNTER — Encounter: Payer: Self-pay | Admitting: Internal Medicine

## 2013-09-22 VITALS — BP 154/84 | HR 93 | Ht 66.0 in | Wt 206.8 lb

## 2013-09-22 DIAGNOSIS — R079 Chest pain, unspecified: Secondary | ICD-10-CM | POA: Diagnosis not present

## 2013-09-22 DIAGNOSIS — R42 Dizziness and giddiness: Secondary | ICD-10-CM

## 2013-09-22 DIAGNOSIS — I10 Essential (primary) hypertension: Secondary | ICD-10-CM | POA: Diagnosis not present

## 2013-09-22 DIAGNOSIS — R0602 Shortness of breath: Secondary | ICD-10-CM | POA: Diagnosis not present

## 2013-09-22 DIAGNOSIS — R609 Edema, unspecified: Secondary | ICD-10-CM | POA: Diagnosis not present

## 2013-09-22 DIAGNOSIS — R600 Localized edema: Secondary | ICD-10-CM

## 2013-09-22 MED ORDER — HYDROCHLOROTHIAZIDE 12.5 MG PO CAPS: 12.5000 mg | ORAL_CAPSULE | Freq: Every day | ORAL | Status: DC

## 2013-09-22 NOTE — Progress Notes (Signed)
PCP:  Mary Ora, MD  The patient presents today for evaluate of edema and chest pain.  I have not seen her since 2011.  At that time, I referred her to Dr Gala Romney for SOB  She had an extensive workup by him.  She has done well since and feels that her SOB is much better.  Recently, she has had difficulty with arthritis and osteomyelitis.  She had a prolonged hospital stay requiring subsequent rehab.  With her immobility, she developed progressive BLE edema.  She had dopplers performed which excluded DVT.  She also reports occasional chest tightness.  This occurs at rest without associated symptoms.  Today, she denies symptoms of palpitations, exertional chest pain, orthopnea, PND, dizziness, presyncope, syncope, or neurologic sequela.  The patient feels that she is tolerating medications without difficulties and is otherwise without complaint today.   Past Medical History  Diagnosis Date  . Hyperlipidemia   . Asthma   . Osteoporosis   . DVT (deep venous thrombosis) 12/2007    PE DVT after neck surgery, coumadin d/c 10-2008  . PE (pulmonary embolism) 12/2007  . Hiatal hernia     s/p repair   . Pulmonary nodule     incidental per CT:  Pet scan 4-9: likely benign, CT 08-2009 no change, no further CTs (Dr. Shelle Iron)  . Hemorrhoids   . IBS (irritable bowel syndrome)   . Colon polyp 1994    malignant  . Diverticulosis   . PPD positive   . Macular degeneration 03/2009    Dr. Earlene Plater  . Allergic rhinitis   . Dyspnea     chronic, extensive w/u see OV note 09-2010  . RSD (reflex sympathetic dystrophy)   . DJD (degenerative joint disease)   . Spinal stenosis   . GERD (gastroesophageal reflux disease)   . Syncope     seen Dr. Gala Romney  . Hypertension     sees Dr. Drue Novel, primary  . Diabetes mellitus     not on medications, controls with diet  . Chronic urinary tract infection   . Headache(784.0)     migraines when younger  . Neuropathy     "has in hands and feet and legs"  . Hyperthyroidism     Past Surgical History  Procedure Laterality Date  . Hiatal hernia repair  01/25/10    with Nissen Fundaplication  . Cholecystectomy    . Total abdominal hysterectomy w/ bilateral salpingoophorectomy    . Cataract extraction    . Rotator cuff repair  07/2007    Dr. Eulah Pont  . Cervical spine surgery  12/18/07    For OA, fusion. Dr. Ophelia Charter:  fu by a PE  . Toe amputation  08/2008    3rd right toe, Dr. Lajoyce Corners due to osteomyelitis  . Hand surgery  09/27/11    Left Hand  . Nasal septum surgery    . Vein ligation  1967  . Carpal tunnel  09/2011  . Amputation  03/06/2012    Procedure: AMPUTATION FOOT;  Surgeon: Nadara Mustard, MD;  Location: [facility] OR;  Service: Orthopedics;  Laterality: Left;  FIFTH RAY AMPUTATION   . Green filter       due to blood clots,   . Ankle fusion  09/27/2012    Procedure: ANKLE FUSION;  Surgeon: Nadara Mustard, MD;  Location: [facility] OR;  Service: Orthopedics;  Laterality: Left;  Left Tibiocalcaneal Fusion  . Cardiac catheterization      Hx: of august 2011 at [facility]  .  Ankle fusion Right 05/09/2013    Procedure: ANKLE FUSION;  Surgeon: Nadara Mustard, MD;  Location: [facility] OR;  Service: Orthopedics;  Laterality: Right;  Excision Osteomyelitis Base 1st MT Right Foot, Fusion Medial Column    Current Outpatient Prescriptions  Medication Sig Dispense Refill  . acetaminophen (TYLENOL) 500 MG tablet Take 500 mg by mouth daily as needed for pain.      . Alpha-Lipoic Acid 600 MG CAPS Take 600 mg by mouth 2 (two) times daily.      Marland Kitchen amLODipine-benazepril (LOTREL) 5-10 MG per capsule Take 1 capsule by mouth every evening.       . Boron 3 MG CAPS Take 3 mg by mouth every evening.       . Cholecalciferol (VITAMIN D3) 5000 UNITS CAPS Take 5,000 Units by mouth every evening.      Marland Kitchen CINNAMON PO Take 250 mg by mouth 2 (two) times daily.       . Cyanocobalamin (VITAMIN B-12) 2500 MCG SUBL Place 2,500 mcg under the tongue daily.      . diazepam (VALIUM) 2 MG tablet Take one tablet by mouth twice  a day as needed for anxiety.  60 tablet  5  . diclofenac sodium (VOLTAREN) 1 % GEL Apply 1 application topically daily as needed (pain).      Marland Kitchen escitalopram (LEXAPRO) 5 MG tablet Take 1 tablet (5 mg total) by mouth daily.  30 tablet  1  . HYDROcodone-acetaminophen (NORCO) 10-325 MG per tablet Take 1 tablet by mouth every 6 (six) hours as needed.  120 tablet  0  . ibuprofen (ADVIL,MOTRIN) 200 MG tablet Take 400 mg by mouth daily as needed for pain.      Boris Lown Oil 300 MG CAPS Take 300 mg by mouth every evening.       . meclizine (ANTIVERT) 12.5 MG tablet Take 25 mg by mouth 2 (two) times daily as needed for nausea.      Marland Kitchen omeprazole (PRILOSEC) 20 MG capsule Take 1 capsule (20 mg total) by mouth 2 (two) times daily.  60 capsule  0  . OVER THE COUNTER MEDICATION Take 1-2 capsules by mouth 2 (two) times daily. Glucose Essentials - 1 capsule every morning, 2 capsules every night      . OVER THE COUNTER MEDICATION Take 400 mg by mouth 2 (two) times daily. tumeric      . OVER THE COUNTER MEDICATION Take 1 capsule by mouth 2 (two) times daily. Integrative Digestive Formula      . oxyCODONE-acetaminophen (ROXICET) 5-325 MG per tablet Take 1 tablet by mouth every 4 (four) hours as needed for pain.  60 tablet  0  . Polyvinyl Alcohol-Povidone (REFRESH OP) Place 1 drop into both eyes daily as needed (dry eyes).      . Probiotic Product (PROBIOTIC DAILY PO) Take 1 capsule by mouth daily.      . tizanidine (ZANAFLEX) 2 MG capsule Take 2 mg by mouth 2 (two) times daily.      Marland Kitchen trimethoprim (TRIMPEX) 100 MG tablet Take 100 mg by mouth at bedtime.      . hydrochlorothiazide (MICROZIDE) 12.5 MG capsule Take 1 capsule (12.5 mg total) by mouth daily.  90 capsule  3   No current facility-administered medications for this visit.    Allergies  Allergen Reactions  . Cymbalta [Duloxetine Hcl] Swelling    Swelling in legs  . Gabapentin Swelling    Swelling in legs  . Adhesive [Tape]  rash  . Cefazolin Hives    . Ciprofloxacin Other (See Comments)    Per pt, caused body aches   . Clorazepate Dipotassium Other (See Comments)    Unknown reaction  . Darifenacin Hydrobromide Other (See Comments)    hypotension, near syncope  . Dilaudid [Hydromorphone Hcl] Other (See Comments)     confused, intense itching  . Enablex [Darifenacin Hydrobromide Er] Other (See Comments)    Hypotension, near syncope  . Lyrica [Pregabalin] Swelling    Swelling in legs  . Methadone Hcl Other (See Comments)    Reaction unknown  . Pentazocine Lactate Other (See Comments)    Altered mental state, "climbing walls", anxiety  . Talwin [Pentazocine] Other (See Comments)    "climbing walls" anxiety  . Doxycycline Rash    History   Social History  . Marital Status: Divorced    Spouse Name: N/A    Number of Children: 3  . Years of Education: N/A   Occupational History  . Retired      from Engineer, site    Social History Main Topics  . Smoking status: Never Smoker   . Smokeless tobacco: Never Used  . Alcohol Use: No  . Drug Use: No  . Sexual Activity: No   Other Topics Concern  . Not on file   Social History Narrative   1 daughter lives w/ her , another daughter lives in town, 1 son in [place]              Family History  Problem Relation Age of Onset  . Migraines      headaches  . Hypertension    . Heart disease Mother     mitral valve replaced  . Rheum arthritis Mother   . Colon cancer Neg Hx   . Diabetic kidney disease Daughter   . Breast cancer      ROS-  All systems are reviewed and are negative except as outlined in the HPI above  Physical Exam: Filed Vitals:   09/22/13 0923  BP: 189/93  Pulse: 93  Height: 5\' 6"  (1.676 m)  Weight: 206 lb 12.8 oz (93.804 kg)    GEN- The patient is anxious appearing, alert and oriented x 3 today.   Head- normocephalic, atraumatic Eyes-  Sclera clear, conjunctiva pink Ears- hearing intact Oropharynx- clear Neck- supple, no JVP Lungs- Clear to  ausculation bilaterally, normal work of breathing Heart- Regular rate and rhythm, no murmurs, rubs or gallops, PMI not laterally displaced GI- soft, NT, ND, + BS Extremities- no clubbing, cyanosis, +2 edema MS- walks with the assistance of a walker Skin- no rash or lesion Psych- euthymic mood, full affect Neuro- strength and sensation are intact  ekg today reveal sinus rhythm 93 bpm, septal infarction, otherwise normal ekg Cath 2011 is reviewed My office note and Dr Melburn Popper notes are reviewed EPIC records are reviewed  Assessment and Plan:  1. BLE edema Likely due to venous insufficiency and immobility with her recent difficulty with osteomyelitis.  Labs are reviewed and renal function is normal I will order an echo.  2 gram sodium diet is encouarged.  Avoid ibuprofen which can cause fluid retention.  HCTZ 12.5mg  daily is added today. Pending results of above and improvement with diuresis, she may benefit from support hose.  HCTZ can be increased further if required upon return  2. Chest pain Both typical and atypical features She had nonobstructive disease by cath in 2011.  I think that it is most prudent to  proceed with further CV risk stratification at this time.  I have ordered a lexiscan myoview as she cannot walk on a treadmill.  3. HTN Above goal Add hctz as above  4. SOB This is clinically much improved from when I saw her in 2011.  Previous workup has been extensive including RHC/LHC, TEE, and CPX tests.  5. Orthostatic dizziness She previously struggled with orthostasis.  If this returns then we will have to stop hctz and place support hose  She has no active EP issues presently and her orthostasis is improved.  She no longer follows with Dr Gala Romney.  I will refer to Dr Eloy End for long term CV management.  She will follow-up with Tereso Newcomer for BMET in 6 weeks in the interim.

## 2013-09-22 NOTE — Telephone Encounter (Signed)
Pt states gave blood and urine sample on her last visit 09/17/13. Spoke to lab no records of U/A. Pt still has odor and frequent urination requesting a prescription today before leaving for 3 weeks.

## 2013-09-22 NOTE — Patient Instructions (Signed)
Your physician recommends that you schedule a follow-up appointment in: 6 weeks with Lilian Coma and 3 months with Dr Delton See  Your physician has recommended you make the following change in your medication:  1) Start HCTZ 12.5mg  daily  Your physician recommends that you return for lab work in: 6 weeks with appointment with Lorin Picket BMP  AVOID Ibuprofen  Your physician has requested that you have an echocardiogram. Echocardiography is a painless test that uses sound waves to create images of your heart. It provides your doctor with information about the size and shape of your heart and how well your heart's chambers and valves are working. This procedure takes approximately one hour. There are no restrictions for this procedure.   Your physician has requested that you have a lexiscan myoview. For further information please visit [email or web address] Please follow instruction sheet, as given.  2 Gram Low Sodium Diet A 2 gram sodium diet restricts the amount of sodium in the diet to no more than 2 g or 2000 mg daily. Limiting the amount of sodium is often used to help lower blood pressure. It is important if you have heart, liver, or kidney problems. Many foods contain sodium for flavor and sometimes as a preservative. When the amount of sodium in a diet needs to be low, it is important to know what to look for when choosing foods and drinks. The following includes some information and guidelines to help make it easier for you to adapt to a low sodium diet. QUICK TIPS  Do not add salt to food.  Avoid convenience items and fast food.  Choose unsalted snack foods.  Buy lower sodium products, often labeled as "lower sodium" or "no salt added."  Check food labels to learn how much sodium is in 1 serving.  When eating at a restaurant, ask that your food be prepared with less salt or none, if possible. READING FOOD LABELS FOR SODIUM INFORMATION The nutrition facts label is a good place to find  how much sodium is in foods. Look for products with no more than 500 to 600 mg of sodium per meal and no more than 150 mg per serving. Remember that 2 g = 2000 mg. The food label may also list foods as:  Sodium-free: Less than 5 mg in a serving.  Very low sodium: 35 mg or less in a serving.  Low-sodium: 140 mg or less in a serving.  Light in sodium: 50% less sodium in a serving. For example, if a food that usually has 300 mg of sodium is changed to become light in sodium, it will have 150 mg of sodium.  Reduced sodium: 25% less sodium in a serving. For example, if a food that usually has 400 mg of sodium is changed to reduced sodium, it will have 300 mg of sodium. CHOOSING FOODS Grains  Avoid: Salted crackers and snack items. Some cereals, including instant hot cereals. Bread stuffing and biscuit mixes. Seasoned rice or pasta mixes.  Choose: Unsalted snack items. Low-sodium cereals, oats, puffed wheat and rice, shredded wheat. English muffins and bread. Pasta. Meats  Avoid: Salted, canned, smoked, spiced, pickled meats, including fish and poultry. Bacon, ham, sausage, cold cuts, hot dogs, anchovies.  Choose: Low-sodium canned tuna and salmon. Fresh or frozen meat, poultry, and fish. Dairy  Avoid: Processed cheese and spreads. Cottage cheese. Buttermilk and condensed milk. Regular cheese.  Choose: Milk. Low-sodium cottage cheese. Yogurt. Sour cream. Low-sodium cheese. Fruits and Vegetables  Avoid: Regular canned vegetables. Regular canned  tomato sauce and paste. Frozen vegetables in sauces. Olives. Rosita Fire. Relishes. Sauerkraut.  Choose: Low-sodium canned vegetables. Low-sodium tomato sauce and paste. Frozen or fresh vegetables. Fresh and frozen fruit. Condiments  Avoid: Canned and packaged gravies. Worcestershire sauce. Tartar sauce. Barbecue sauce. Soy sauce. Steak sauce. Ketchup. Onion, garlic, and table salt. Meat flavorings and tenderizers.  Choose: Fresh and dried herbs and  spices. Low-sodium varieties of mustard and ketchup. Lemon juice. Tabasco sauce. Horseradish. SAMPLE 2 GRAM SODIUM MEAL PLAN Breakfast / Sodium (mg)  1 cup low-fat milk / 143 mg  2 slices whole-wheat toast / 270 mg  1 tbs heart-healthy margarine / 153 mg  1 hard-boiled egg / 139 mg  1 small orange / 0 mg Lunch / Sodium (mg)  1 cup raw carrots / 76 mg   cup hummus / 298 mg  1 cup low-fat milk / 143 mg   cup red grapes / 2 mg  1 whole-wheat pita bread / 356 mg Dinner / Sodium (mg)  1 cup whole-wheat pasta / 2 mg  1 cup low-sodium tomato sauce / 73 mg  3 oz lean ground beef / 57 mg  1 small side salad (1 cup raw spinach leaves,  cup cucumber,  cup yellow bell pepper) with 1 tsp olive oil and 1 tsp red wine vinegar / 25 mg Snack / Sodium (mg)  1 container low-fat vanilla yogurt / 107 mg  3 graham cracker squares / 127 mg Nutrient Analysis  Calories: 2033  Protein: 77 g  Carbohydrate: 282 g  Fat: 72 g  Sodium: 1971 mg Document Released: 10/09/2005 Document Revised: 01/01/2012 Document Reviewed: 01/10/2010 Exit[employer] Patient Information 2014 [place], [place].

## 2013-09-22 NOTE — Telephone Encounter (Signed)
Patient called and stated that she has a urine lab test done last week and have not heard anything yet to see if she has a bladder infection.

## 2013-09-23 NOTE — Telephone Encounter (Signed)
Ask  patient to bring a urine sample today---> UA  urine culture DX UTI.

## 2013-09-23 NOTE — Telephone Encounter (Signed)
Pt out of town for couple weeks.

## 2013-09-25 DIAGNOSIS — E1142 Type 2 diabetes mellitus with diabetic polyneuropathy: Secondary | ICD-10-CM | POA: Diagnosis not present

## 2013-09-25 DIAGNOSIS — M9080 Osteopathy in diseases classified elsewhere, unspecified site: Secondary | ICD-10-CM | POA: Diagnosis not present

## 2013-09-25 DIAGNOSIS — T8189XA Other complications of procedures, not elsewhere classified, initial encounter: Secondary | ICD-10-CM | POA: Diagnosis not present

## 2013-09-25 DIAGNOSIS — E1149 Type 2 diabetes mellitus with other diabetic neurological complication: Secondary | ICD-10-CM | POA: Diagnosis not present

## 2013-09-25 DIAGNOSIS — E1169 Type 2 diabetes mellitus with other specified complication: Secondary | ICD-10-CM | POA: Diagnosis not present

## 2013-09-27 DIAGNOSIS — N390 Urinary tract infection, site not specified: Secondary | ICD-10-CM | POA: Diagnosis not present

## 2013-10-01 ENCOUNTER — Encounter: Payer: Self-pay | Admitting: Cardiology

## 2013-10-08 ENCOUNTER — Other Ambulatory Visit: Payer: Self-pay | Admitting: Internal Medicine

## 2013-10-09 NOTE — Telephone Encounter (Signed)
rx refilled per protocol. DJR  

## 2013-10-14 ENCOUNTER — Encounter: Payer: Self-pay | Admitting: Cardiology

## 2013-10-14 ENCOUNTER — Encounter: Payer: Self-pay | Admitting: Internal Medicine

## 2013-10-14 ENCOUNTER — Telehealth: Payer: Self-pay | Admitting: Internal Medicine

## 2013-10-14 ENCOUNTER — Ambulatory Visit (HOSPITAL_COMMUNITY): Payer: Medicare Other | Attending: Cardiology | Admitting: Radiology

## 2013-10-14 ENCOUNTER — Ambulatory Visit (INDEPENDENT_AMBULATORY_CARE_PROVIDER_SITE_OTHER): Payer: Medicare Other | Admitting: Internal Medicine

## 2013-10-14 VITALS — BP 126/80 | HR 108 | Ht 63.0 in | Wt 202.2 lb

## 2013-10-14 DIAGNOSIS — E785 Hyperlipidemia, unspecified: Secondary | ICD-10-CM | POA: Insufficient documentation

## 2013-10-14 DIAGNOSIS — R079 Chest pain, unspecified: Secondary | ICD-10-CM

## 2013-10-14 DIAGNOSIS — R0602 Shortness of breath: Secondary | ICD-10-CM | POA: Diagnosis not present

## 2013-10-14 DIAGNOSIS — K625 Hemorrhage of anus and rectum: Secondary | ICD-10-CM

## 2013-10-14 DIAGNOSIS — R1319 Other dysphagia: Secondary | ICD-10-CM

## 2013-10-14 DIAGNOSIS — I10 Essential (primary) hypertension: Secondary | ICD-10-CM | POA: Diagnosis not present

## 2013-10-14 DIAGNOSIS — R609 Edema, unspecified: Secondary | ICD-10-CM | POA: Diagnosis not present

## 2013-10-14 DIAGNOSIS — R072 Precordial pain: Secondary | ICD-10-CM

## 2013-10-14 DIAGNOSIS — K5900 Constipation, unspecified: Secondary | ICD-10-CM

## 2013-10-14 DIAGNOSIS — I059 Rheumatic mitral valve disease, unspecified: Secondary | ICD-10-CM | POA: Diagnosis not present

## 2013-10-14 DIAGNOSIS — Z86718 Personal history of other venous thrombosis and embolism: Secondary | ICD-10-CM | POA: Diagnosis not present

## 2013-10-14 DIAGNOSIS — E669 Obesity, unspecified: Secondary | ICD-10-CM | POA: Insufficient documentation

## 2013-10-14 DIAGNOSIS — E119 Type 2 diabetes mellitus without complications: Secondary | ICD-10-CM | POA: Diagnosis not present

## 2013-10-14 MED ORDER — HYOSCYAMINE SULFATE 0.125 MG SL SUBL: 0.1250 mg | SUBLINGUAL_TABLET | SUBLINGUAL | Status: DC | PRN

## 2013-10-14 MED ORDER — MOVIPREP 100 G PO SOLR: 1.0000 | Freq: Once | ORAL | Status: DC

## 2013-10-14 MED ORDER — LINACLOTIDE 145 MCG PO CAPS: 145.0000 ug | ORAL_CAPSULE | Freq: Every day | ORAL | Status: DC

## 2013-10-14 MED ORDER — OMEPRAZOLE 20 MG PO CPDR: 20.0000 mg | DELAYED_RELEASE_CAPSULE | Freq: Two times a day (BID) | ORAL | Status: DC

## 2013-10-14 MED ORDER — HYDROCORTISONE ACETATE 25 MG RE SUPP: 25.0000 mg | Freq: Every day | RECTAL | Status: DC

## 2013-10-14 NOTE — Telephone Encounter (Signed)
Please schedule her for EGD/colon for my hospital week.

## 2013-10-14 NOTE — Telephone Encounter (Signed)
Spoke with patient's EC and she is concerned about patient waiting until February for [facility]. She states patient is having multiple problems and she is worried it is cancer. Please, advise.

## 2013-10-14 NOTE — Patient Instructions (Signed)
You have been scheduled for an endoscopy and colonoscopy with propofol. Please follow the written instructions given to you at your visit today. Please pick up your prep at the pharmacy within the next 1-3 days. If you use inhalers (even only as needed), please bring them with you on the day of your procedure. Your physician has requested that you go to www.startemmi.com and enter the access code given to you at your visit today. This web site gives a general overview about your procedure. However, you should still follow specific instructions given to you by our office regarding your preparation for the procedure.  We have sent the following medications to your pharmacy for you to pick up at your convenience: Prilosec Anusol Levsin SL Linzess  Cc: Dr Drue Novel

## 2013-10-14 NOTE — Progress Notes (Signed)
Echocardiogram performed.  

## 2013-10-14 NOTE — Progress Notes (Signed)
[name] 03-03-1935 [id number]   History of Present Illness:  This is a 78 year old white female with several gastrointestinal issues. I have been following her for 20 years for gastroesophageal reflux and esophageal dysmotility. A Nissen fundoplication was done in April 2011. She comes today complaining of  chest pains while eating. . A barium swallow in 2012 showed esophageal dysmotility, decreased peristalsis and a mildly dilated distal esophagus with tertiary contractions. A 13 mm tablet passed with some delay. A subsequent upper endoscopy in August 2012 showed retained food in the stomach and a patulous distal esophagus.  Another problem has been constipation and right lower quadrant abdominal pain. 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 Several subsequent colonoscopies showed adenomatous polyps (September 1997 and most recently in August 2010 when she had 3 adenomatous polyps removed). She is due for a repeat colonoscopy in 2015. She has been bothered with hemorrhoids and bleeding. Other medical problems include RSD, diabetes, obesity. She had a prior cholecystectomy. She has recently undergone several orthopedic operations involving her feet. She is on chronic pain medications which may explain her slow gastric emptying.    Past Medical History  Diagnosis Date  . Hyperlipidemia   . Asthma   . Osteoporosis   . DVT (deep venous thrombosis) 12/2007    PE DVT after neck surgery, coumadin d/c 10-2008  . PE (pulmonary embolism) 12/2007  . Hiatal hernia     s/p repair   . Pulmonary nodule     incidental per CT:  Pet scan 4-9: likely benign, CT 08-2009 no change, no further CTs (Dr. Shelle Iron)  . Hemorrhoids   . IBS (irritable bowel syndrome)   . Colon polyp 1994    malignant  . Diverticulosis   . PPD positive   . Macular degeneration 03/2009    Dr. Earlene Plater  . Allergic rhinitis   . Dyspnea     chronic, extensive w/u see OV note 09-2010  . RSD (reflex sympathetic  dystrophy)   . DJD (degenerative joint disease)   . Spinal stenosis   . GERD (gastroesophageal reflux disease)   . Syncope     seen Dr. Gala Romney  . Hypertension     sees Dr. Drue Novel, primary  . Diabetes mellitus     not on medications, controls with diet  . Chronic urinary tract infection   . Headache(784.0)     migraines when younger  . Neuropathy     "has in hands and feet and legs"  . Hyperthyroidism     Past Surgical History  Procedure Laterality Date  . Hiatal hernia repair  01/25/10    with Nissen Fundaplication  . Cholecystectomy    . Total abdominal hysterectomy w/ bilateral salpingoophorectomy    . Cataract extraction    . Rotator cuff repair  07/2007    Dr. Eulah Pont  . Cervical spine surgery  12/18/07    For OA, fusion. Dr. Ophelia Charter:  fu by a PE  . Toe amputation  08/2008    3rd right toe, Dr. Lajoyce Corners due to osteomyelitis  . Hand surgery  09/27/11    Left Hand  . Nasal septum surgery    . Vein ligation  1967  . Carpal tunnel  09/2011  . Amputation  03/06/2012    Procedure: AMPUTATION FOOT;  Surgeon: Nadara Mustard, MD;  Location: [facility] OR;  Service: Orthopedics;  Laterality: Left;  FIFTH RAY AMPUTATION   . Green filter  due to blood clots,   . Ankle fusion  09/27/2012    Procedure: ANKLE FUSION;  Surgeon: Nadara Mustard, MD;  Location: [facility] OR;  Service: Orthopedics;  Laterality: Left;  Left Tibiocalcaneal Fusion  . Cardiac catheterization      Hx: of august 2011 at [facility]  . Ankle fusion Right 05/09/2013    Procedure: ANKLE FUSION;  Surgeon: Nadara Mustard, MD;  Location: [facility] OR;  Service: Orthopedics;  Laterality: Right;  Excision Osteomyelitis Base 1st MT Right Foot, Fusion Medial Column    Allergies  Allergen Reactions  . Cymbalta [Duloxetine Hcl] Swelling    Swelling in legs  . Gabapentin Swelling    Swelling in legs  . Adhesive [Tape]     rash  . Cefazolin Hives  . Ciprofloxacin Other (See Comments)    Per pt, caused body aches   . Clorazepate Dipotassium  Other (See Comments)    Unknown reaction  . Darifenacin Hydrobromide Other (See Comments)    hypotension, near syncope  . Dilaudid [Hydromorphone Hcl] Other (See Comments)     confused, intense itching  . Enablex [Darifenacin Hydrobromide Er] Other (See Comments)    Hypotension, near syncope  . Lyrica [Pregabalin] Swelling    Swelling in legs  . Methadone Hcl Other (See Comments)    Reaction unknown  . Pentazocine Lactate Other (See Comments)    Altered mental state, "climbing walls", anxiety  . Talwin [Pentazocine] Other (See Comments)    "climbing walls" anxiety  . Doxycycline Rash    Family history and social history have been reviewed.  Review of Systems: Positive for dysphagia and chest pain with eating. No vomiting. Constipation with occasional overflow diarrhea and small volume rectal bleeding  The remainder of the 10 point ROS is negative except as outlined in the H&P  Physical Exam: General Appearance Well developed, in no distress overweight. Walks with walker Eyes  Non icteric  HEENT  Non traumatic, normocephalic  Mouth No lesion, tongue papillated, no cheilosis Neck Supple without adenopathy, thyroid not enlarged, no carotid bruits, no JVD Lungs Clear to auscultation bilaterally COR Normal S1, normal S2, rapid  regular rhythm, no murmur, quiet precordium Abdomen Obese soft tender in right lower quadrant. No palpable mass. Liver edge at costal margin. No ascites  Rectal Normal rectal sphincter tone. Large external hemorrhoids. Stool is Hemoccult negative  Extremities  No pedal edema, post surgeries on her ankle , she has a brace on her left leg  Skin No lesions Neurological Alert and oriented x 3 Psychological Normal mood and affect  Assessment and Plan:   Problem #1 Recurrent dysphagia and esophageal dysmotility. Patient is status post remote Nissen fundoplication which is causing relative partial obstruction of the distal esophagus. She was relieved with  esophageal dilation in the past. We will proceed with an upper endoscopy and dilation and will also refill her Prilosec. I have given her a trial of Levsin sublingually 0.125 mg to take when necessary for chest pain.assuming we may be dealing with a spasm.  Problem #2 Rectal bleeding. 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 She is due for a repeat colonoscopy which will be scheduled the same day as her endoscopy.  Problem #3 Symptomatic external and internal hemorrhoids. She is to use Anusol-HC suppositories.  Problem #4 Constipation. I have given samples of Linzess 145 ug for her to take daily on a trial basis in addition to Miralax.  [name] 10/14/2013

## 2013-10-15 ENCOUNTER — Encounter: Payer: Self-pay | Admitting: *Deleted

## 2013-10-15 NOTE — Telephone Encounter (Signed)
Spoke with patient's daughter and gave her new date and time. Mailed new instructions to patient.

## 2013-10-15 NOTE — Telephone Encounter (Signed)
Spoke with Dawn at endo and scheduled at [facility] endo on 11/10/13 at 10:30 AM for propofol ECOL.

## 2013-10-20 ENCOUNTER — Encounter: Payer: Self-pay | Admitting: Cardiology

## 2013-10-20 ENCOUNTER — Ambulatory Visit (HOSPITAL_COMMUNITY): Payer: Medicare Other | Attending: Cardiology | Admitting: Radiology

## 2013-10-20 ENCOUNTER — Telehealth: Payer: Self-pay | Admitting: *Deleted

## 2013-10-20 VITALS — BP 163/94 | Ht 65.0 in | Wt 197.0 lb

## 2013-10-20 DIAGNOSIS — R42 Dizziness and giddiness: Secondary | ICD-10-CM | POA: Insufficient documentation

## 2013-10-20 DIAGNOSIS — R079 Chest pain, unspecified: Secondary | ICD-10-CM | POA: Diagnosis not present

## 2013-10-20 DIAGNOSIS — R0602 Shortness of breath: Secondary | ICD-10-CM

## 2013-10-20 DIAGNOSIS — R0609 Other forms of dyspnea: Secondary | ICD-10-CM | POA: Diagnosis not present

## 2013-10-20 DIAGNOSIS — I10 Essential (primary) hypertension: Secondary | ICD-10-CM | POA: Insufficient documentation

## 2013-10-20 DIAGNOSIS — R0989 Other specified symptoms and signs involving the circulatory and respiratory systems: Secondary | ICD-10-CM | POA: Insufficient documentation

## 2013-10-20 DIAGNOSIS — J45909 Unspecified asthma, uncomplicated: Secondary | ICD-10-CM | POA: Diagnosis not present

## 2013-10-20 DIAGNOSIS — E119 Type 2 diabetes mellitus without complications: Secondary | ICD-10-CM | POA: Insufficient documentation

## 2013-10-20 MED ORDER — TECHNETIUM TC 99M SESTAMIBI GENERIC - CARDIOLITE
11.0000 | Freq: Once | INTRAVENOUS | Status: AC | PRN
  Administered 2013-10-20: 11 via INTRAVENOUS

## 2013-10-20 MED ORDER — REGADENOSON 0.4 MG/5ML IV SOLN
0.4000 mg | Freq: Once | INTRAVENOUS | Status: AC
  Administered 2013-10-20: 0.4 mg via INTRAVENOUS

## 2013-10-20 MED ORDER — TECHNETIUM TC 99M SESTAMIBI GENERIC - CARDIOLITE
33.0000 | Freq: Once | INTRAVENOUS | Status: AC | PRN
  Administered 2013-10-20: 33 via INTRAVENOUS

## 2013-10-20 NOTE — Progress Notes (Signed)
[facility] SITE 3 NUCLEAR MED [address] [place], [place] [address] [phone]    Cardiology Nuclear Med Study  [name] is a 78 y.o. female     MRN : [id number]     DOB: [birth year]  Procedure Date: 10/20/2013  Nuclear Med Background Indication for Stress Test:  Evaluation for Ischemia History:  MPI 2010-nml, EF 65%; Cath 2011-nonobstructive dz; Echo 10/14/13-EF 60-65%; asthma Cardiac Risk Factors: Hypertension, Lipids and NIDDM  Symptoms:  Chest Tightness(last episode yesterday), Dizziness and DOE   Nuclear Pre-Procedure Caffeine/Decaff Intake:  None NPO After: 8:00pm   Lungs:  clear O2 Sat:99% on room air. IV 0.9% NS with Angio Cath:  22g  IV Site: R Antecubital  IV Started by:  Doyne Keel, CNMT  Chest Size (in):  44 Cup Size: B  Height: 5\' 5"  (1.651 m)  Weight:  197 lb (89.359 kg)  BMI:  Body mass index is 32.78 kg/(m^2). Tech Comments:  Held all am meds except pain med    Nuclear Med Study 1 or 2 day study: 1 day  Stress Test Type:  Eugenie Birks  Reading MD: Tobias Alexander, MD  Order Authorizing Provider:  J. Allred, MD  Resting Radionuclide: Technetium 77m Sestamibi  Resting Radionuclide Dose: 11.0 mCi   Stress Radionuclide:  Technetium 56m Sestamibi  Stress Radionuclide Dose: 33.0 mCi           Stress Protocol Rest HR: 97 Stress HR: 108  Rest BP: 163/94 Stress BP: 158/92  Exercise Time (min): n/a METS: n/a   Predicted Max HR: 144 bpm % Max HR: 75 bpm Rate Pressure Product: 21308   Dose of Adenosine (mg):  n/a Dose of Lexiscan: 0.4 mg  Dose of Atropine (mg): n/a Dose of Dobutamine: n/a mcg/kg/min (at max HR)  Stress Test Technologist: Cathlyn Parsons, RN  Nuclear Technologist:  Domenic Polite, CNMT     Rest Procedure:  Myocardial perfusion imaging was performed at rest 45 minutes following the intravenous administration of Technetium 41m Sestamibi. Rest ECG: NSR - Normal EKG  Stress Procedure:  The patient received IV Lexiscan 0.4  mg over 15-seconds.  Technetium 18m Sestamibi injected at 30-seconds.  Quantitative spect images were obtained after a 45 minute delay. Stress ECG: No significant change from baseline ECG  QPS Raw Data Images:  Normal; no motion artifact; normal heart/lung ratio. Stress Images:  There is decreased uptake in the mid and apical anterior wall and in the true apex.  Rest Images:  Normal homogeneous uptake in all areas of the myocardium. Subtraction (SDS):  There medium size severe severity reversible defect in the mid and apical anterior wall and in the true apex.  Transient Ischemic Dilatation (Normal <1.22):  0.97 Lung/Heart Ratio (Normal <0.45):  0.21  Quantitative Gated Spect Images QGS EDV:  81 ml QGS ESV:  26 ml  Impression Exercise Capacity:  Lexiscan with no exercise. BP Response:  Normal blood pressure response. Clinical Symptoms:  Mild chest pain/dyspnea. ECG Impression:  No significant ST segment change suggestive of ischemia. Comparison with Prior Nuclear Study: There is a change when compared to the prior study.  Overall Impression:  Intermediate risk stress nuclear study with medium size severe severity reversible defect in the mid LAD terrritory..  LV Ejection Fraction: 68%.  LV Wall Motion:  NL LV Function; hypokinesis in the mid and apical anterior wall.   Tobias Alexander, Rexene Edison 10/20/2013

## 2013-10-20 NOTE — Telephone Encounter (Signed)
UDS collected 09/19/13 LOW risk  

## 2013-10-21 ENCOUNTER — Encounter (HOSPITAL_COMMUNITY): Payer: Self-pay | Admitting: Pharmacy Technician

## 2013-10-21 ENCOUNTER — Telehealth: Payer: Self-pay | Admitting: Internal Medicine

## 2013-10-21 MED ORDER — HYDROCODONE-ACETAMINOPHEN 10-325 MG PO TABS: 1.0000 | ORAL_TABLET | Freq: Four times a day (QID) | ORAL | Status: DC | PRN

## 2013-10-21 MED ORDER — MAGIC MOUTHWASH: 5.0000 mL | Freq: Three times a day (TID) | ORAL | Status: DC | PRN

## 2013-10-21 NOTE — Addendum Note (Signed)
Addended by: Eustace Quail on: 10/21/2013 03:29 PM   Modules accepted: Orders

## 2013-10-21 NOTE — Telephone Encounter (Signed)
rx refill- hydrocodone 10--325mg  Last OV-09/17/13 Last refilled- 09/17/13 Last UDS 01/31/13 LOW  rx request for magic wash.

## 2013-10-21 NOTE — Telephone Encounter (Signed)
Patient called requesting rx for hydrocodone. Call home # when ready for pick up. Patient also requesting rx for magic mouthwash. She states her mouth "burns" from time to time and this has helped in the past. Pt uses [facility] on [address]

## 2013-10-21 NOTE — Telephone Encounter (Signed)
#  30 of narcotic  Magic mouthwash 5 cc gargle and swallow 3 times a day as needed 90 cc

## 2013-10-22 ENCOUNTER — Encounter (HOSPITAL_COMMUNITY): Payer: Self-pay | Admitting: *Deleted

## 2013-10-22 DIAGNOSIS — N3020 Other chronic cystitis without hematuria: Secondary | ICD-10-CM | POA: Diagnosis not present

## 2013-10-22 NOTE — Progress Notes (Signed)
I meant that since you met her, know her and ordered the stress test do you think she should go directly to cath? Do I even need to see her prior to that? That will just delay her workup. Let me know. K

## 2013-10-24 ENCOUNTER — Encounter: Payer: Self-pay | Admitting: Physical Medicine & Rehabilitation

## 2013-10-29 ENCOUNTER — Telehealth: Payer: Self-pay | Admitting: Internal Medicine

## 2013-10-29 ENCOUNTER — Other Ambulatory Visit (INDEPENDENT_AMBULATORY_CARE_PROVIDER_SITE_OTHER): Payer: Medicare Other

## 2013-10-29 ENCOUNTER — Encounter: Payer: Self-pay | Admitting: *Deleted

## 2013-10-29 ENCOUNTER — Encounter: Payer: Self-pay | Admitting: Physician Assistant

## 2013-10-29 ENCOUNTER — Ambulatory Visit (INDEPENDENT_AMBULATORY_CARE_PROVIDER_SITE_OTHER): Payer: Medicare Other | Admitting: Physician Assistant

## 2013-10-29 ENCOUNTER — Encounter (INDEPENDENT_AMBULATORY_CARE_PROVIDER_SITE_OTHER): Payer: Self-pay

## 2013-10-29 VITALS — BP 150/73 | HR 90 | Ht 64.0 in | Wt 195.8 lb

## 2013-10-29 DIAGNOSIS — I10 Essential (primary) hypertension: Secondary | ICD-10-CM

## 2013-10-29 DIAGNOSIS — R079 Chest pain, unspecified: Secondary | ICD-10-CM

## 2013-10-29 DIAGNOSIS — I2510 Atherosclerotic heart disease of native coronary artery without angina pectoris: Secondary | ICD-10-CM

## 2013-10-29 DIAGNOSIS — R0602 Shortness of breath: Secondary | ICD-10-CM | POA: Diagnosis not present

## 2013-10-29 DIAGNOSIS — R9439 Abnormal result of other cardiovascular function study: Secondary | ICD-10-CM

## 2013-10-29 DIAGNOSIS — E785 Hyperlipidemia, unspecified: Secondary | ICD-10-CM | POA: Diagnosis not present

## 2013-10-29 LAB — BASIC METABOLIC PANEL
BUN: 9 mg/dL (ref 6–23)
CO2: 30 mEq/L (ref 19–32)
Calcium: 8.8 mg/dL (ref 8.4–10.5)
Chloride: 98 mEq/L (ref 96–112)
Creatinine, Ser: 0.8 mg/dL (ref 0.4–1.2)
GFR: 75.13 mL/min (ref >60.00)
Glucose, Bld: 112 mg/dL — ABNORMAL HIGH (ref 70–99)
Potassium: 3.6 mEq/L (ref 3.5–5.1)
Sodium: 139 mEq/L (ref 135–145)

## 2013-10-29 MED ORDER — ASPIRIN EC 81 MG PO TBEC: 81.0000 mg | DELAYED_RELEASE_TABLET | Freq: Every day | ORAL | Status: DC

## 2013-10-29 MED ORDER — METOPROLOL SUCCINATE ER 25 MG PO TB24: 25.0000 mg | ORAL_TABLET | Freq: Every day | ORAL | Status: DC

## 2013-10-29 MED ORDER — HYDROCODONE-ACETAMINOPHEN 10-325 MG PO TABS: 1.0000 | ORAL_TABLET | Freq: Four times a day (QID) | ORAL | Status: DC | PRN

## 2013-10-29 NOTE — Patient Instructions (Signed)
PER SCOTT WEAVER, PAC DO NOT START THE HCTZ  START TOPROL XL 25 MG 1 TABLET DAILY START ASPIRIN 20 MG DAILY  Your physician has requested that you have a cardiac catheterization. LEFT HEART CATH WITH DR. VARANASI @ 7:30 AM. Cardiac catheterization is used to diagnose and/or treat various heart conditions. Doctors may recommend this procedure for a number of different reasons. The most common reason is to evaluate chest pain. Chest pain can be a symptom of coronary artery disease (CAD), and cardiac catheterization can show whether plaque is narrowing or blocking your heart's arteries. This procedure is also used to evaluate the valves, as well as measure the blood flow and oxygen levels in different parts of your heart. For further information please visit [email or web address]. Please follow instruction sheet, as given.

## 2013-10-29 NOTE — Progress Notes (Signed)
[address], [place], [place]  [address] Phone: [phone] Fax:  [phone]  Date:  10/29/2013   ID:  AWA BACHICHA, DOB 10/26/1934, MRN [id number]  PCP:  [name], MD  Cardiologist:  Dr. Thompson Grayer => Dr. Ena Dawley     History of Present Illness: [name] is a 79 y.o. female with a hx of HTN, HL, T2DM, orthostatic hypotension, prior DVT after neck surgery in 2010, s/p IVC filter, GERD and reflex sympathetic dystrophy.    She had an extensive workup for dyspnea in the past.  She had neck surgery in February 2009 and noted dyspnea afterwards.  Initially found to have pulmonary emboli and treated with coumadin for 1 year.  She saw Dr. Gwenette Greet at the end of 2010. VQ negative for recurrent emboli. CT chest showed small lung nodule but no signifcant parenchymal disease. Echo 12/10 with EF 65% and grade I diastolic dysfx. No signifcant valvular disease.  In 4/11 found to have moderate to large hiatal hernia with stomach protruding into chest. She had that repaired with no improvement in symptoms.  She was referred to Dr. Rayann Heman who had concern for possible CAD or platypnea/orthodeoxia.  She was ultimately referred to Dr. Glori Bickers.  Cath 05/2010 showed non-obstructive CAD (proximal LAD 20, mid LAD 40, proximal Dx 40-50, lower branch of the Dx 70-80, proximal CFX 30, EF 60%). RHC normal with RA 5 RV 31/2 PA 27/12 (19) PCW 10 CO normal. No evidence of shunting with sitting up in cath lab. TEE EF 60% trivial MR. Redundant atrial septum. No PFO. Bubble negative.  CPX test in 2011: Resting spriometry normal. pVO2 15.2 (95% predicted) adjusts to 20.7 when corrected for ideal body weight. ve/vco2 slope markedly elevated at 46. O2 pulse 100%. VE/MVV 56%. Sats 97% throughot exercise.  Dyspnea was felt to be mostly a ventilatory limitation. Could not exclude concomittant circulatory limitation.  She had fluoro of diaphragms. R diaphragm elevated at rest but both moved with inspiration.   Abdominal US (10/2011): No AAA.  Patient was recently reevaluated by Dr. Rayann Heman last month for edema and chest pain. Of note, the patient had an admission to the hospital in 04/2013 with Charcot collapse of the right foot with osteomyelitis and ulceration. She underwent excision of the osteomyelitis.  She spent time in rehabilitation after discharge. Edema was felt to likely be from venous insufficiency and immobility. HCTZ was added to her medical regimen. Echocardiogram (10/14/13): Mild LVH, mild focal basal hypertrophy of the septum, EF 60-65%, normal wall motion, grade 1 diastolic dysfunction, MAC, mild LAE, atrial septal aneurysm, PASP 34. There was a possible oscillating density on the mitral valve on parasternal long axis views (suggest TEE if indicated).  Patient was set up for [place] to rule out ischemia. LexiScan Myoview (10/20/13): Intermediate risk; medium-size reversible defect in the mid LAD territory, EF 68%.  She returns for follow up.  She continues to note chest pressure.  This mainly occurs with meals.  She takes antacids with relief. She has radiation of symptoms to bilateral arms and her back.  She denies assoc dyspnea.  She is deconditioned from her rehab stay after foot surgery.  No syncope.  No significant LE edema.  No orthopnea, PND.    Recent Labs: 09/17/2013: ALT 12; Creatinine 1.0; Direct LDL 117.9; HDL 39.30; Hemoglobin 12.1; Potassium 3.7; TSH 1.91   Wt Readings from Last 3 Encounters:  10/20/13 197 lb (89.359 kg)  10/14/13 202 lb  4 oz (91.74 kg)  09/22/13 206 lb 12.8 oz (93.804 kg)     Past Medical History  Diagnosis Date  . Hyperlipidemia   . Asthma   . Osteoporosis   . DVT (deep venous thrombosis) 12/2007    PE DVT after neck surgery, coumadin d/c 10-2008  . PE (pulmonary embolism) 12/2007  . Hiatal hernia     s/p repair   . Pulmonary nodule     incidental per CT:  Pet scan 4-9: likely benign, CT 08-2009 no change, no further CTs (Dr. Clance)  .  Hemorrhoids   . IBS (irritable bowel syndrome)   . Colon polyp 1994    malignant  . Diverticulosis   . PPD positive   . Macular degeneration 03/2009    Dr. Davis  . Allergic rhinitis   . Dyspnea     chronic, extensive w/u see OV note 09-2010  . RSD (reflex sympathetic dystrophy)   . DJD (degenerative joint disease)   . Spinal stenosis   . GERD (gastroesophageal reflux disease)   . Syncope     seen Dr. Bensimhon  . Hypertension     sees Dr. Paz, primary  . Diabetes mellitus     not on medications, controls with diet  . Chronic urinary tract infection     10-22-13- MD visit yesterday for ? UTI  . Headache(784.0)     migraines when younger  . Neuropathy     "has in hands and feet and legs"  . Hyperthyroidism     Current Outpatient Prescriptions  Medication Sig Dispense Refill  . acetaminophen (TYLENOL) 500 MG tablet Take 500 mg by mouth daily as needed for mild pain or moderate pain.       . Alpha-Lipoic Acid 600 MG CAPS Take 600 mg by mouth 2 (two) times daily.      . Alum & Mag Hydroxide-Simeth (MAGIC MOUTHWASH) SOLN Take 5 mLs by mouth 3 (three) times daily as needed for mouth pain.  90 mL  0  . amLODipine-benazepril (LOTREL) 5-10 MG per capsule Take 1 capsule by mouth at bedtime.      . Boron 3 MG CAPS Take 3 mg by mouth every evening.       . Cholecalciferol (VITAMIN D3) 5000 UNITS CAPS Take 5,000 Units by mouth every evening.      . CINNAMON PO Take 250 mg by mouth 2 (two) times daily.       . Cyanocobalamin (VITAMIN B-12) 2500 MCG SUBL Place 2,500 mcg under the tongue daily.      . diclofenac sodium (VOLTAREN) 1 % GEL Apply 1 application topically daily as needed (pain).      . escitalopram (LEXAPRO) 5 MG tablet Take 5 mg by mouth daily.      . HYDROcodone-acetaminophen (NORCO) 10-325 MG per tablet Take 1 tablet by mouth every 6 (six) hours as needed for moderate pain or severe pain.  30 tablet  0  . hydrocortisone (ANUSOL-HC) 25 MG suppository Place 25 mg rectally at  bedtime.      . hyoscyamine (LEVSIN SL) 0.125 MG SL tablet Place 0.125 mg under the tongue every 4 (four) hours as needed for cramping.      . ibuprofen (ADVIL,MOTRIN) 200 MG tablet Take 400 mg by mouth daily as needed for mild pain or moderate pain.       . Krill Oil 300 MG CAPS Take 300 mg by mouth every evening.       . Linaclotide (LINZESS) 145   MCG CAPS capsule Take 145 mcg by mouth daily with breakfast.      . meclizine (ANTIVERT) 12.5 MG tablet Take 25 mg by mouth 2 (two) times daily as needed for dizziness or nausea.       Marland Kitchen MOVIPREP 100 G SOLR Take 1 kit (200 g total) by mouth once.  1 kit  0  . Multiple Vitamins-Minerals (PRESERVISION AREDS 2 PO) Take 2 tablets by mouth daily.      . nabumetone (RELAFEN) 750 MG tablet Take 750 mg by mouth 2 (two) times daily.      Marland Kitchen omeprazole (PRILOSEC) 20 MG capsule Take 20 mg by mouth 2 (two) times daily.      Marland Kitchen OVER THE COUNTER MEDICATION Take 1-2 capsules by mouth 2 (two) times daily. Glucose Essentials - 1 capsule every morning, 2 capsules every night      . OVER THE COUNTER MEDICATION Take 400 mg by mouth 2 (two) times daily. tumeric      . OVER THE COUNTER MEDICATION Take 1 capsule by mouth 2 (two) times daily. Integrative Digestive Formula      . Polyvinyl Alcohol-Povidone (REFRESH OP) Place 1 drop into both eyes daily as needed (dry eyes).      . Probiotic Product (PROBIOTIC DAILY PO) Take 1 capsule by mouth daily.      Marland Kitchen trimethoprim (TRIMPEX) 100 MG tablet Take 100 mg by mouth at bedtime.       No current facility-administered medications for this visit.    Allergies:   Cymbalta; Gabapentin; Adhesive; Cefazolin; Ciprofloxacin; Clorazepate dipotassium; Darifenacin hydrobromide; Dilaudid; Enablex; Lyrica; Methadone hcl; Pentazocine lactate; Talwin; and Doxycycline   Social History:  The patient  reports that she has never smoked. She has never used smokeless tobacco. She reports that she does not drink alcohol or use illicit drugs.   Family  History:  The patient's family history includes Breast cancer in an other family member; Diabetic kidney disease in her daughter; Heart disease in her mother; Hypertension in an other family member; Migraines in an other family member; Rheum arthritis in her mother. There is no history of Colon cancer.   ROS:  Please see the history of present illness.   She notes increased belching.  She has an EGD and colo with Dr. Olevia Perches later this month.  She has had recent UTI.  She has had recent diarrhea.  All other systems reviewed and negative.   PHYSICAL EXAM: VS:  BP 150/73  Pulse 90  Ht _0  (1.626 m)  Wt 195 lb 12.8 oz (88.814 kg)  BMI 33.59 kg/m2 Well nourished, well developed, in no acute distress HEENT: normal Neck: no JVD at 90 Cardiac:  normal S1, S2; RRR; no murmur Lungs:  clear to auscultation bilaterally, no wheezing, rhonchi or rales Abd: soft, nontender, no hepatomegaly Ext: trace bilateral LE edema Skin: warm and dry Neuro:  CNs 2-12 intact, no focal abnormalities noted  EKG:  NSR, HR 90, normal axis, T-wave inversions in 1 and aVL     ASSESSMENT AND PLAN:  1. Chest Pain:  She has a history of nonobstructive disease by cardiac catheterization in 2011. She has a history of diabetes. She has atypical >atypical chest symptoms. Most of her symptoms seem to be related to meals. However, she does have significant radiation to her arms and back. Recent Myoview is intermediate risk.  She now has lateral TWI on ECG (new).  I have discussed her case with Dr. Rayann Heman. We have recommended proceeding  with cardiac catheterization.  Risks and benefits of cardiac catheterization have been discussed with the patient.  These include bleeding, infection, kidney damage, stroke, heart attack, death.  The patient understands these risks and is willing to proceed.  2. CAD:  Start ASA 81 mg QD and Toprol XL 25 mg QD.  She is unwilling to use statins.  Proceed with LHC as noted. 3. Hypertension:  Borderline  control.  Continue Lotrel.  Start Toprol XL 25 QD. 4. Abnormal Echocardiogram:  I asked Dr. Ena Dawley to review her echo.  No significant abnormality other than MAC noted with her MV.  She does not need TEE.  5. Disposition:  Proceed with cath this week with Dr. Casandra Doffing.    Signed, Richardson Dopp, PA-C  10/29/2013 8:40 AM

## 2013-10-29 NOTE — Telephone Encounter (Signed)
Patient wants to know if Dr. Larose Kells can give her an rx for HYDROcodone-acetaminophen ([place]) 10-325 MG per tablet #120. She was given #30 from Dr. Linna Darner last week but wants the #120 that Dr. Larose Kells usually gives her. Please advise.

## 2013-10-29 NOTE — Telephone Encounter (Signed)
Prescription printed, #120

## 2013-10-29 NOTE — H&P (Signed)
History and Physical  Date:  10/29/2013   ID:  [name], DOB 06/11/1935, MRN [id number]  PCP:  [name], MD  Cardiologist:  Dr. Thompson Grayer => Dr. Ena Dawley     History of Present Illness: [name] is a 79 y.o. female with a hx of HTN, HL, T2DM, orthostatic hypotension, prior DVT after neck surgery in 2010, s/p IVC filter, GERD and reflex sympathetic dystrophy.    She had an extensive workup for dyspnea in the past.  She had neck surgery in February 2009 and noted dyspnea afterwards.  Initially found to have pulmonary emboli and treated with coumadin for 1 year.  She saw Dr. Gwenette Greet at the end of 2010. VQ negative for recurrent emboli. CT chest showed small lung nodule but no signifcant parenchymal disease. Echo 12/10 with EF 65% and grade I diastolic dysfx. No signifcant valvular disease.  In 4/11 found to have moderate to large hiatal hernia with stomach protruding into chest. She had that repaired with no improvement in symptoms.  She was referred to Dr. Rayann Heman who had concern for possible CAD or platypnea/orthodeoxia.  She was ultimately referred to Dr. Glori Bickers.  Cath 05/2010 showed non-obstructive CAD (proximal LAD 20, mid LAD 40, proximal Dx 40-50, lower branch of the Dx 70-80, proximal CFX 30, EF 60%). RHC normal with RA 5 RV 31/2 PA 27/12 (19) PCW 10 CO normal. No evidence of shunting with sitting up in cath lab. TEE EF 60% trivial MR. Redundant atrial septum. No PFO. Bubble negative.  CPX test in 2011: Resting spriometry normal. pVO2 15.2 (95% predicted) adjusts to 20.7 when corrected for ideal body weight. ve/vco2 slope markedly elevated at 46. O2 pulse 100%. VE/MVV 56%. Sats 97% throughot exercise.  Dyspnea was felt to be mostly a ventilatory limitation. Could not exclude concomittant circulatory limitation.  She had fluoro of diaphragms. R diaphragm elevated at rest but both moved with inspiration.  Abdominal US (10/2011): No AAA.  Patient was recently reevaluated by  Dr. Rayann Heman last month for edema and chest pain. Of note, the patient had an admission to the hospital in 04/2013 with Charcot collapse of the right foot with osteomyelitis and ulceration. She underwent excision of the osteomyelitis.  She spent time in rehabilitation after discharge. Edema was felt to likely be from venous insufficiency and immobility. HCTZ was added to her medical regimen. Echocardiogram (10/14/13): Mild LVH, mild focal basal hypertrophy of the septum, EF 60-65%, normal wall motion, grade 1 diastolic dysfunction, MAC, mild LAE, atrial septal aneurysm, PASP 34. There was a possible oscillating density on the mitral valve on parasternal long axis views (suggest TEE if indicated).  Patient was set up for [place] to rule out ischemia. LexiScan Myoview (10/20/13): Intermediate risk; medium-size reversible defect in the mid LAD territory, EF 68%.  She returns for follow up.  She continues to note chest pressure.  This mainly occurs with meals.  She takes antacids with relief. She has radiation of symptoms to bilateral arms and her back.  She denies assoc dyspnea.  She is deconditioned from her rehab stay after foot surgery.  No syncope.  No significant LE edema.  No orthopnea, PND.    Recent Labs: 09/17/2013: ALT 12; Creatinine 1.0; Direct LDL 117.9; HDL 39.30; Hemoglobin 12.1; Potassium 3.7; TSH 1.91   Wt Readings from Last 3 Encounters:  10/20/13 197 lb (89.359 kg)  10/14/13 202 lb 4 oz (91.74 kg)  09/22/13 206 lb 12.8 oz (93.804 kg)  Past Medical History  Diagnosis Date  . Hyperlipidemia   . Asthma   . Osteoporosis   . DVT (deep venous thrombosis) 12/2007    PE DVT after neck surgery, coumadin d/c 10-2008  . PE (pulmonary embolism) 12/2007  . Hiatal hernia     s/p repair   . Pulmonary nodule     incidental per CT:  Pet scan 4-9: likely benign, CT 08-2009 no change, no further CTs (Dr. Gwenette Greet)  . Hemorrhoids   . IBS (irritable bowel syndrome)   . Colon polyp 1994     malignant  . Diverticulosis   . PPD positive   . Macular degeneration 03/2009    Dr. Rosana Hoes  . Allergic rhinitis   . Dyspnea     chronic, extensive w/u see OV note 09-2010  . RSD (reflex sympathetic dystrophy)   . DJD (degenerative joint disease)   . Spinal stenosis   . GERD (gastroesophageal reflux disease)   . Syncope     seen Dr. Haroldine Laws  . Hypertension     sees Dr. Larose Kells, primary  . Diabetes mellitus     not on medications, controls with diet  . Chronic urinary tract infection     10-22-13- MD visit yesterday for ? UTI  . Headache(784.0)     migraines when younger  . Neuropathy     "has in hands and feet and legs"  . Hyperthyroidism     Current Outpatient Prescriptions  Medication Sig Dispense Refill  . acetaminophen (TYLENOL) 500 MG tablet Take 500 mg by mouth daily as needed for mild pain or moderate pain.       . Alpha-Lipoic Acid 600 MG CAPS Take 600 mg by mouth 2 (two) times daily.      . Alum & Mag Hydroxide-Simeth (MAGIC MOUTHWASH) SOLN Take 5 mLs by mouth 3 (three) times daily as needed for mouth pain.  90 mL  0  . amLODipine-benazepril (LOTREL) 5-10 MG per capsule Take 1 capsule by mouth at bedtime.      . Boron 3 MG CAPS Take 3 mg by mouth every evening.       . Cholecalciferol (VITAMIN D3) 5000 UNITS CAPS Take 5,000 Units by mouth every evening.      Marland Kitchen CINNAMON PO Take 250 mg by mouth 2 (two) times daily.       . Cyanocobalamin (VITAMIN B-12) 2500 MCG SUBL Place 2,500 mcg under the tongue daily.      . diclofenac sodium (VOLTAREN) 1 % GEL Apply 1 application topically daily as needed (pain).      Marland Kitchen escitalopram (LEXAPRO) 5 MG tablet Take 5 mg by mouth daily.      Marland Kitchen HYDROcodone-acetaminophen (NORCO) 10-325 MG per tablet Take 1 tablet by mouth every 6 (six) hours as needed for moderate pain or severe pain.  30 tablet  0  . hydrocortisone (ANUSOL-HC) 25 MG suppository Place 25 mg rectally at bedtime.      . hyoscyamine (LEVSIN SL) 0.125 MG SL tablet Place 0.125 mg  under the tongue every 4 (four) hours as needed for cramping.      Marland Kitchen ibuprofen (ADVIL,MOTRIN) 200 MG tablet Take 400 mg by mouth daily as needed for mild pain or moderate pain.       Javier Docker Oil 300 MG CAPS Take 300 mg by mouth every evening.       . Linaclotide (LINZESS) 145 MCG CAPS capsule Take 145 mcg by mouth daily with breakfast.      .  meclizine (ANTIVERT) 12.5 MG tablet Take 25 mg by mouth 2 (two) times daily as needed for dizziness or nausea.       Marland Kitchen MOVIPREP 100 G SOLR Take 1 kit (200 g total) by mouth once.  1 kit  0  . Multiple Vitamins-Minerals (PRESERVISION AREDS 2 PO) Take 2 tablets by mouth daily.      . nabumetone (RELAFEN) 750 MG tablet Take 750 mg by mouth 2 (two) times daily.      Marland Kitchen omeprazole (PRILOSEC) 20 MG capsule Take 20 mg by mouth 2 (two) times daily.      Marland Kitchen OVER THE COUNTER MEDICATION Take 1-2 capsules by mouth 2 (two) times daily. Glucose Essentials - 1 capsule every morning, 2 capsules every night      . OVER THE COUNTER MEDICATION Take 400 mg by mouth 2 (two) times daily. tumeric      . OVER THE COUNTER MEDICATION Take 1 capsule by mouth 2 (two) times daily. Integrative Digestive Formula      . Polyvinyl Alcohol-Povidone (REFRESH OP) Place 1 drop into both eyes daily as needed (dry eyes).      . Probiotic Product (PROBIOTIC DAILY PO) Take 1 capsule by mouth daily.      Marland Kitchen trimethoprim (TRIMPEX) 100 MG tablet Take 100 mg by mouth at bedtime.       No current facility-administered medications for this visit.    Allergies:   Cymbalta; Gabapentin; Adhesive; Cefazolin; Ciprofloxacin; Clorazepate dipotassium; Darifenacin hydrobromide; Dilaudid; Enablex; Lyrica; Methadone hcl; Pentazocine lactate; Talwin; and Doxycycline   Social History:  The patient  reports that she has never smoked. She has never used smokeless tobacco. She reports that she does not drink alcohol or use illicit drugs.   Family History:  The patient's family history includes Breast cancer in an other  family member; Diabetic kidney disease in her daughter; Heart disease in her mother; Hypertension in an other family member; Migraines in an other family member; Rheum arthritis in her mother. There is no history of Colon cancer.   ROS:  Please see the history of present illness.   She notes increased belching.  She has an EGD and colo with Dr. Olevia Perches later this month.  She has had recent UTI.  She has had recent diarrhea.  All other systems reviewed and negative.   PHYSICAL EXAM: VS:  BP 150/73  Pulse 90  Ht 5' 4"  (1.626 m)  Wt 195 lb 12.8 oz (88.814 kg)  BMI 33.59 kg/m2 Well nourished, well developed, in no acute distress HEENT: normal Neck: no JVD at 90 Cardiac:  normal S1, S2; RRR; no murmur Lungs:  clear to auscultation bilaterally, no wheezing, rhonchi or rales Abd: soft, nontender, no hepatomegaly Ext: trace bilateral LE edema Skin: warm and dry Neuro:  CNs 2-12 intact, no focal abnormalities noted  EKG:  NSR, HR 90, normal axis, T-wave inversions in 1 and aVL     ASSESSMENT AND PLAN:  1. Chest Pain:  She has a history of nonobstructive disease by cardiac catheterization in 2011. She has a history of diabetes. She has atypical >atypical chest symptoms. Most of her symptoms seem to be related to meals. However, she does have significant radiation to her arms and back. Recent Myoview is intermediate risk.  She now has lateral TWI on ECG (new).  I have discussed her case with Dr. Rayann Heman. We have recommended proceeding with cardiac catheterization.  Risks and benefits of cardiac catheterization have been discussed with the patient.  These include bleeding, infection, kidney damage, stroke, heart attack, death.  The patient understands these risks and is willing to proceed.  2. CAD:  Start ASA 81 mg QD and Toprol XL 25 mg QD.  She is unwilling to use statins.  Proceed with LHC as noted. 3. Hypertension:  Borderline control.  Continue Lotrel.  Start Toprol XL 25 QD. 4. Abnormal  Echocardiogram:  I asked Dr. Ena Dawley to review her echo.  No significant abnormality other than MAC noted with her MV.  She does not need TEE.  5. Disposition:  Proceed with cath this week with Dr. Casandra Doffing.    Signed, Richardson Dopp, PA-C  10/29/2013 8:40 AM    Thompson Grayer MD

## 2013-10-29 NOTE — Addendum Note (Signed)
Addended by: [name] E on: 10/29/2013 04:53 PM   Modules accepted: Orders

## 2013-10-29 NOTE — Telephone Encounter (Signed)
rx request- hydrocodone 10/325mg  Last ov- 09/17/13 Last refilled- 10/21/13 #30 / 0 rf Last UDS- 01/31/13 LOW

## 2013-10-30 ENCOUNTER — Encounter (HOSPITAL_COMMUNITY): Payer: Self-pay

## 2013-10-31 ENCOUNTER — Encounter (HOSPITAL_COMMUNITY): Payer: Self-pay | Admitting: Physician Assistant

## 2013-10-31 ENCOUNTER — Encounter (HOSPITAL_COMMUNITY)
Admission: RE | Disposition: A | Discharge status: Home / Self Care | Payer: Self-pay | Source: Ambulatory Visit | Attending: Interventional Cardiology

## 2013-10-31 ENCOUNTER — Ambulatory Visit (HOSPITAL_COMMUNITY)
Admission: RE | Admit: 2013-10-31 | Discharge: 2013-11-01 | Disposition: A | Discharge status: Home / Self Care | Payer: Medicare Other | Source: Ambulatory Visit | Attending: Interventional Cardiology | Admitting: Interventional Cardiology

## 2013-10-31 DIAGNOSIS — Z86711 Personal history of pulmonary embolism: Secondary | ICD-10-CM | POA: Diagnosis not present

## 2013-10-31 DIAGNOSIS — I200 Unstable angina: Secondary | ICD-10-CM | POA: Diagnosis not present

## 2013-10-31 DIAGNOSIS — K649 Unspecified hemorrhoids: Secondary | ICD-10-CM | POA: Insufficient documentation

## 2013-10-31 DIAGNOSIS — I2510 Atherosclerotic heart disease of native coronary artery without angina pectoris: Secondary | ICD-10-CM | POA: Diagnosis present

## 2013-10-31 DIAGNOSIS — Z860100 Personal history of colon polyps, unspecified: Secondary | ICD-10-CM | POA: Insufficient documentation

## 2013-10-31 DIAGNOSIS — Z86718 Personal history of other venous thrombosis and embolism: Secondary | ICD-10-CM | POA: Diagnosis not present

## 2013-10-31 DIAGNOSIS — E119 Type 2 diabetes mellitus without complications: Secondary | ICD-10-CM | POA: Insufficient documentation

## 2013-10-31 DIAGNOSIS — I10 Essential (primary) hypertension: Secondary | ICD-10-CM | POA: Diagnosis not present

## 2013-10-31 DIAGNOSIS — M810 Age-related osteoporosis without current pathological fracture: Secondary | ICD-10-CM | POA: Diagnosis not present

## 2013-10-31 DIAGNOSIS — E785 Hyperlipidemia, unspecified: Secondary | ICD-10-CM | POA: Diagnosis not present

## 2013-10-31 DIAGNOSIS — M1990 Unspecified osteoarthritis, unspecified site: Secondary | ICD-10-CM | POA: Insufficient documentation

## 2013-10-31 DIAGNOSIS — K219 Gastro-esophageal reflux disease without esophagitis: Secondary | ICD-10-CM | POA: Insufficient documentation

## 2013-10-31 DIAGNOSIS — M4800 Spinal stenosis, site unspecified: Secondary | ICD-10-CM | POA: Insufficient documentation

## 2013-10-31 DIAGNOSIS — K625 Hemorrhage of anus and rectum: Secondary | ICD-10-CM | POA: Diagnosis present

## 2013-10-31 DIAGNOSIS — E1142 Type 2 diabetes mellitus with diabetic polyneuropathy: Secondary | ICD-10-CM | POA: Diagnosis present

## 2013-10-31 DIAGNOSIS — I209 Angina pectoris, unspecified: Secondary | ICD-10-CM

## 2013-10-31 DIAGNOSIS — R9439 Abnormal result of other cardiovascular function study: Secondary | ICD-10-CM

## 2013-10-31 DIAGNOSIS — R079 Chest pain, unspecified: Secondary | ICD-10-CM

## 2013-10-31 DIAGNOSIS — D099 Carcinoma in situ, unspecified: Secondary | ICD-10-CM | POA: Diagnosis present

## 2013-10-31 HISTORY — DX: Abnormal findings on diagnostic imaging of heart and coronary circulation: R93.1

## 2013-10-31 HISTORY — DX: Atherosclerotic heart disease of native coronary artery without angina pectoris: I25.10

## 2013-10-31 HISTORY — DX: Osteomyelitis, unspecified: M86.9

## 2013-10-31 HISTORY — DX: Urinary tract infection, site not specified: N39.0

## 2013-10-31 HISTORY — PX: CORONARY ANGIOPLASTY: SHX604

## 2013-10-31 HISTORY — DX: Type 2 diabetes mellitus without complications: E11.9

## 2013-10-31 HISTORY — DX: Carcinoma in situ, unspecified: D09.9

## 2013-10-31 HISTORY — PX: PERCUTANEOUS CORONARY INTERVENTION-BALLOON ONLY: SHX6014

## 2013-10-31 HISTORY — DX: Unspecified osteoarthritis, unspecified site: M19.90

## 2013-10-31 HISTORY — DX: Orthostatic hypotension: I95.1

## 2013-10-31 HISTORY — PX: LEFT HEART CATHETERIZATION WITH CORONARY ANGIOGRAM: SHX5451

## 2013-10-31 HISTORY — DX: Venous insufficiency (chronic) (peripheral): I87.2

## 2013-10-31 LAB — GLUCOSE, CAPILLARY
Glucose-Capillary: 102 mg/dL — ABNORMAL HIGH (ref 70–99)
Glucose-Capillary: 85 mg/dL (ref 70–99)
Glucose-Capillary: 90 mg/dL (ref 70–99)
Glucose-Capillary: 160 mg/dL — ABNORMAL HIGH (ref 70–99)

## 2013-10-31 LAB — CBC
HCT: 33.8 % — ABNORMAL LOW (ref 36.0–46.0)
Hemoglobin: 11 g/dL — ABNORMAL LOW (ref 12.0–15.0)
MCH: 28.6 pg (ref 26.0–34.0)
MCHC: 32.5 g/dL (ref 30.0–36.0)
MCV: 87.8 fL (ref 78.0–100.0)
Platelets: 195 10*3/uL (ref 150–400)
RBC: 3.85 MIL/uL — ABNORMAL LOW (ref 3.87–5.11)
RDW: 14.4 % (ref 11.5–15.5)
WBC: 6.1 10*3/uL (ref 4.0–10.5)

## 2013-10-31 LAB — POCT ACTIVATED CLOTTING TIME
Activated Clotting Time: 205 seconds
Activated Clotting Time: 227 seconds

## 2013-10-31 LAB — PROTIME-INR
INR: 1.04 (ref 0.00–1.49)
Prothrombin Time: 13.4 seconds (ref 11.6–15.2)

## 2013-10-31 SURGERY — LEFT HEART CATHETERIZATION WITH CORONARY ANGIOGRAM
Anesthesia: LOCAL

## 2013-10-31 MED ORDER — ASPIRIN EC 81 MG PO TBEC: 81.0000 mg | DELAYED_RELEASE_TABLET | Freq: Every day | ORAL | Status: DC

## 2013-10-31 MED ORDER — HEPARIN SODIUM (PORCINE) 1000 UNIT/ML IJ SOLN
INTRAMUSCULAR | Status: AC
  Filled 2013-10-31: qty 1

## 2013-10-31 MED ORDER — MAGIC MOUTHWASH
5.0000 mL | Freq: Three times a day (TID) | ORAL | Status: DC | PRN
  Filled 2013-10-31: qty 5

## 2013-10-31 MED ORDER — CLOPIDOGREL BISULFATE 300 MG PO TABS
ORAL_TABLET | ORAL | Status: AC
  Filled 2013-10-31: qty 1

## 2013-10-31 MED ORDER — HYOSCYAMINE SULFATE 0.125 MG SL SUBL
0.1250 mg | SUBLINGUAL_TABLET | SUBLINGUAL | Status: DC | PRN
  Filled 2013-10-31: qty 1

## 2013-10-31 MED ORDER — ESCITALOPRAM OXALATE 5 MG PO TABS
5.0000 mg | ORAL_TABLET | Freq: Every day | ORAL | Status: DC
  Filled 2013-10-31 (×2): qty 1

## 2013-10-31 MED ORDER — BENAZEPRIL HCL 10 MG PO TABS
10.0000 mg | ORAL_TABLET | Freq: Every day | ORAL | Status: DC
  Administered 2013-10-31: 10 mg via ORAL
  Filled 2013-10-31 (×4): qty 1

## 2013-10-31 MED ORDER — LIDOCAINE HCL (PF) 1 % IJ SOLN
INTRAMUSCULAR | Status: AC
  Filled 2013-10-31: qty 30

## 2013-10-31 MED ORDER — METOPROLOL SUCCINATE ER 25 MG PO TB24
25.0000 mg | ORAL_TABLET | Freq: Every day | ORAL | Status: DC
  Filled 2013-10-31 (×2): qty 1

## 2013-10-31 MED ORDER — ACETAMINOPHEN 325 MG PO TABS: 650.0000 mg | ORAL_TABLET | ORAL | Status: DC | PRN

## 2013-10-31 MED ORDER — HEPARIN (PORCINE) IN NACL 2-0.9 UNIT/ML-% IJ SOLN
INTRAMUSCULAR | Status: AC
  Filled 2013-10-31: qty 1000

## 2013-10-31 MED ORDER — METOPROLOL SUCCINATE ER 25 MG PO TB24
25.0000 mg | ORAL_TABLET | Freq: Every day | ORAL | Status: DC
  Administered 2013-10-31: 25 mg via ORAL
  Filled 2013-10-31 (×4): qty 1

## 2013-10-31 MED ORDER — AMLODIPINE BESYLATE 5 MG PO TABS
5.0000 mg | ORAL_TABLET | Freq: Every day | ORAL | Status: DC
  Administered 2013-10-31: 5 mg via ORAL
  Filled 2013-10-31 (×4): qty 1

## 2013-10-31 MED ORDER — SODIUM CHLORIDE 0.9 % IV SOLN: 250.0000 mL | INTRAVENOUS | Status: DC | PRN

## 2013-10-31 MED ORDER — SODIUM CHLORIDE 0.9 % IJ SOLN: 3.0000 mL | Freq: Two times a day (BID) | INTRAMUSCULAR | Status: DC

## 2013-10-31 MED ORDER — CLOPIDOGREL BISULFATE 75 MG PO TABS
75.0000 mg | ORAL_TABLET | Freq: Every day | ORAL | Status: DC
  Administered 2013-11-01: 75 mg via ORAL
  Filled 2013-10-31 (×3): qty 1

## 2013-10-31 MED ORDER — ASPIRIN 81 MG PO CHEW: 81.0000 mg | CHEWABLE_TABLET | ORAL | Status: DC

## 2013-10-31 MED ORDER — HYDROCODONE-ACETAMINOPHEN 10-325 MG PO TABS
1.0000 | ORAL_TABLET | Freq: Four times a day (QID) | ORAL | Status: DC | PRN
  Administered 2013-10-31 – 2013-11-01 (×3): 1 via ORAL
  Filled 2013-10-31 (×3): qty 1

## 2013-10-31 MED ORDER — FENTANYL CITRATE 0.05 MG/ML IJ SOLN
INTRAMUSCULAR | Status: AC
Start: 2013-10-31 — End: 2013-10-31
  Filled 2013-10-31: qty 2

## 2013-10-31 MED ORDER — VERAPAMIL HCL 2.5 MG/ML IV SOLN
INTRAVENOUS | Status: AC
  Filled 2013-10-31: qty 2

## 2013-10-31 MED ORDER — PANTOPRAZOLE SODIUM 40 MG PO TBEC
40.0000 mg | DELAYED_RELEASE_TABLET | Freq: Every day | ORAL | Status: DC
  Administered 2013-10-31: 40 mg via ORAL

## 2013-10-31 MED ORDER — ASPIRIN 81 MG PO CHEW: 81.0000 mg | CHEWABLE_TABLET | Freq: Every day | ORAL | Status: DC

## 2013-10-31 MED ORDER — ONDANSETRON HCL 4 MG/2ML IJ SOLN
INTRAMUSCULAR | Status: AC
  Filled 2013-10-31: qty 2

## 2013-10-31 MED ORDER — SODIUM CHLORIDE 0.9 % IV SOLN: INTRAVENOUS | Status: AC

## 2013-10-31 MED ORDER — ONDANSETRON HCL 4 MG/2ML IJ SOLN: 4.0000 mg | Freq: Four times a day (QID) | INTRAMUSCULAR | Status: DC | PRN

## 2013-10-31 MED ORDER — HYDROCORTISONE ACETATE 25 MG RE SUPP
25.0000 mg | Freq: Every day | RECTAL | Status: DC
  Filled 2013-10-31 (×2): qty 1

## 2013-10-31 MED ORDER — SODIUM CHLORIDE 0.9 % IJ SOLN: 3.0000 mL | INTRAMUSCULAR | Status: DC | PRN

## 2013-10-31 MED ORDER — ACETAMINOPHEN 500 MG PO TABS: 500.0000 mg | ORAL_TABLET | Freq: Every day | ORAL | Status: DC | PRN

## 2013-10-31 MED ORDER — AMOXICILLIN 500 MG PO CAPS
500.0000 mg | ORAL_CAPSULE | Freq: Three times a day (TID) | ORAL | Status: DC
  Administered 2013-10-31: 500 mg via ORAL
  Filled 2013-10-31 (×3): qty 1

## 2013-10-31 MED ORDER — ESCITALOPRAM OXALATE 5 MG PO TABS
5.0000 mg | ORAL_TABLET | Freq: Every day | ORAL | Status: DC
  Administered 2013-10-31: 15:00:00 5 mg via ORAL
  Filled 2013-10-31 (×4): qty 1

## 2013-10-31 MED ORDER — PANTOPRAZOLE SODIUM 40 MG PO TBEC
40.0000 mg | DELAYED_RELEASE_TABLET | Freq: Every day | ORAL | Status: DC
  Filled 2013-10-31 (×2): qty 1

## 2013-10-31 MED ORDER — NABUMETONE 750 MG PO TABS
750.0000 mg | ORAL_TABLET | Freq: Two times a day (BID) | ORAL | Status: DC
  Administered 2013-10-31: 750 mg via ORAL
  Filled 2013-10-31 (×3): qty 1

## 2013-10-31 MED ORDER — AMLODIPINE BESY-BENAZEPRIL HCL 5-10 MG PO CAPS: 1.0000 | ORAL_CAPSULE | Freq: Every day | ORAL | Status: DC

## 2013-10-31 MED ORDER — ASPIRIN 81 MG PO CHEW
CHEWABLE_TABLET | ORAL | Status: AC
  Filled 2013-10-31: qty 1

## 2013-10-31 MED ORDER — SODIUM CHLORIDE 0.9 % IV SOLN
INTRAVENOUS | Status: DC
  Administered 2013-10-31: 1000 mL via INTRAVENOUS

## 2013-10-31 MED ORDER — MIDAZOLAM HCL 2 MG/2ML IJ SOLN
INTRAMUSCULAR | Status: AC
  Filled 2013-10-31: qty 2

## 2013-10-31 NOTE — Progress Notes (Addendum)
Per d/w Dr. Irish Lack - recommend to continue Plavix at least 2 weeks, if not 1 month. Pt's colonoscopy was originally scheduled for Monday 11/10/13 which would be too soon to come off Plavix. I called Azucena Freed who advised me to send staff message to Dr. Olevia Perches to make her aware of our request to reschedule, which I did. F/u made with Richardson Dopp for 11/12/13. I left instructions on patient's AVS to call Dr. Nichola Sizer office if she had not heard from them within 3 business days. Dayna Dunn PA-C

## 2013-10-31 NOTE — Discharge Summary (Signed)
Discharge Summary   Patient ID: [name] MRN: [id number], DOB/AGE: [birth year] 79 y.o. Admit date: 10/31/2013 D/C date:     11/01/2013  Primary Care Provider: [name], MD Primary Cardiologist: Dr. Thompson Grayer => Dr. Ena Dawley   Primary Discharge Diagnoses:  1. CAD/accelerating angina - s/p cutting balloon to D2; mild LAD disease 10/31/13 2. HTN 3. Type 2 diabetes mellitus 4. 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 Hyperlipidemia, pt unwilling to use statins  Secondary Discharge Diagnoses:  . Osteoporosis   . PE/DVT after neck surgery 2009; Coumadin d/c 10-2008   . Hiatal hernia s/p Nissen fundoplication 0867.   . Pulmonary nodule     incidental per CT:  Pet scan 4-9: likely benign, CT 08-2009 no change, no further CTs (Dr. Gwenette Greet)  . Hemorrhoids   . IBS (irritable bowel syndrome)   . Diverticulosis   . PPD positive   . Macular degeneration   . Allergic rhinitis   . Dyspnea     a. Chronic, extensive w/u see OV note 09-2010. b. [place] 2011: ormal with RA 5 RV 31/2 PA 27/12 (19) PCW 10 CO normal. No evidence of shunting with sitting up in cath lab. c. CPX 2011: see report. d. Prior fluoro of diaphragm -  R diaphragm elevated at rest but both moved with inspiration.   . RSD (reflex sympathetic dystrophy)/chronic pain   . Spinal stenosis   . GERD (gastroesophageal reflux disease)     a. Hx GERD/esophageal dysmotility followed by Dr. Olevia Perches.   . Syncope - has seen Dr. Haroldine Laws   . Neuropathy - hands, feet, legs   . Orthostatic hypotension   . Osteomyelitis - Adm 04/2013: Charcot collapse of the right foot with osteomyelitis and ulceration, s/p excision.   . Venous insufficiency - contributing to LEE.   Marland Kitchen DJD (degenerative joint disease)   . Arthritis    . Recurrent UTI   . Abnormal echocardiogram - 09/2013: mild LVH, mild focal basal hypertrophy of septum, EF 60-65%, normal WM,  grade 1 diastolic dysfunction, MAC, mild LAE, ASA, PASP 34. Possible oscillating MV density - reviewed by MD and felt there was no significant abnormality other than MAC noted with mitral valve and did not require TEE.     Hospital Course: [name] is a 79 y.o. female with a hx of HTN, HL, T2DM, orthostatic hypotension, prior DVT after neck surgery in 2009 s/p IVC filter, GERD, reflex sympathetic dystrophy with chronic pain, and prior nonobstructive CAD. She presented to the office recently on 10/29/13 to follow up regarding an abnormal nuclear stress test.  To review her significant PMH, she had an extensive workup for dyspnea in the past. She had neck surgery in February 2009 and noted dyspnea afterwards. Initially found to have pulmonary emboli and treated with coumadin for 1 year. She saw Dr. Gwenette Greet at the end of 2010. VQ negative for recurrent emboli. CT chest showed small lung nodule but no signifcant parenchymal disease. Echo 12/10 with EF 65% and grade I diastolic dysfx. No signifcant valvular disease. In 4/11 found to have moderate to large hiatal hernia with stomach protruding into chest. She had that repaired with no improvement in symptoms. She was referred to Dr. Rayann Heman who had concern for possible CAD or platypnea/orthodeoxia. She was ultimately referred to Dr. Glori Bickers. Cath 05/2010 showed non-obstructive CAD (proximal LAD 2, mid LAD 40, proximal Dx  40-50, lower branch of the Dx 70-80, proximal CFX 30, EF 60%). RHC normal with RA 5 RV 31/2 PA 27/12 (19) PCW 10 CO normal. No evidence of shunting with sitting up in cath lab. TEE EF 60% trivial MR. Redundant atrial septum. No PFO. Bubble negative. CPX test in 2011: Resting spriometry normal. pVO2 15.2 (95% predicted) adjusts to 20.7 when corrected for ideal body weight. ve/vco2 slope markedly elevated at 46. O2 pulse 100%. VE/MVV 56%. Sats 97% throughout exercise. Dyspnea was felt to be mostly a ventilatory limitation. Could not exclude  concomittant circulatory limitation. She had fluoro of diaphragms. R diaphragm elevated at rest but both moved with inspiration. Abdominal US (10/2011): No AAA. She had admission in 04/2013 for osteomyelitis with Charcot collapse of R foot s/p excision and spent time at rehab.   She was recently reevaluated in the office for chest pain and edema. Her edema was felt related to venous insufficiency. HCTZ was prescribed but it is not clear that she started this. Echocardiogram (10/14/13): Mild LVH, mild focal basal hypertrophy of the septum, EF 60-65%, normal wall motion, grade 1 diastolic dysfunction, MAC, mild LAE, atrial septal aneurysm, PASP 34. There was a possible oscillating density on the mitral valve on parasternal long axis views (suggest TEE if indicated). Dr. Meda Coffee reviewed this echo and felt there was no significant abnormality other than MAC noted with her MV and did not require TEE. The patient reported chest pressure, mainly occurring with meals, with radiation of pain to arms and back. She underwent Lexiscan Myoview 10/20/13 which was intermediate risk with medium-size reversible defect in the mid LAD territory, EF 68%. ECG showed new TWI. Cardiac cath was recommended.  She was brought in for this procedure 10/31/13 which demonstrated: 1. Normal left main coronary artery. 2. Mild mid vessel left anterior descending artery atherosclerosis. A large second diagonal has a 95% stenosis which was successfully treated with a 2.0 x 6 cutting balloon. There is an excellent Angiographic result. 3. Widely patent left circumflex artery and its branches. 4. Normal right coronary artery. 5. Normal left ventricular systolic function by echo. LVEDP 10 mmHg.   No stent was placed - she has a GI evaluation coming up which is one reason why we did not want to commit her to long-term Plavix. (Per Dr. Sydell Axon Brodie's note, she has had rectal bleeding/hemorrhoids with history of adenomatous polyps and in situ  carcinoma in a sigmoid polyp in 1994. She is due for repeat colonoscopy/endoscopy.) Dr. Hassell Done recommendation was to continue DAPT for a minimum of 2 weeks, but likely a month. The patient originally had her colonoscopy scheduled for 11/10/13. We spoke with the [facility] who recommended to send a message to Dr. Olevia Perches via EPIC to discuss pushing this back, which was done. The patient was also instructed to contact Dr. Nichola Sizer office if she hadn't heard from them within 3 business days regarding rescheduling.   On 1/10, [name] was seen by Dr. Harl Bowie. She was seen by cardiac rehabilitation and was ambulating without chest pain or shortness of breath. Her cath site was without ecchymosis or hematoma. She was otherwise doing well and no further inpatient workup was indicated. Dr. Harl Bowie and evaluated [name] and reviewed all data. He considers her stable for discharge, to follow up as an outpatient.  Discharge Vitals: Blood pressure 119/49, pulse 66, temperature 98.2 F (36.8 C), temperature source Oral, resp. rate 18, height 5\' 4"  (1.626 m), weight 199 lb 4.7 oz (90.4 kg),  SpO2 98.00%.  Labs: Lab Results  Component Value Date   WBC 5.5 11/01/2013   HGB 10.1* 11/01/2013   HCT 30.7* 11/01/2013   MCV 87.7 11/01/2013   PLT 171 11/01/2013     Recent Labs Lab 11/01/13 0255  NA 140  K 4.0  CL 103  CO2 25  BUN 11  CREATININE 0.68  CALCIUM 8.6  GLUCOSE 95    Diagnostic Studies/Procedures   Cardiac Cath 10/31/13 PROCEDURE: Left heart catheterization with selective coronary angiography, left ventriculogram.  INDICATIONS: Abnormal stress test, class III angina  The risks, benefits, and details of the procedure were explained to the patient. The patient verbalized understanding and wanted to proceed. Informed written consent was obtained.  PROCEDURE TECHNIQUE: After Xylocaine anesthesia a 44F slender sheath was placed in the right radial artery with a single anterior needle wall stick.  IV heparin was given . Right coronary angiography was done using a Judkins R4 guide catheter. Left coronary angiography was done using a Judkins L3.5 guide catheter. Left ventriculography was done using a pigtail catheter. A TR band was used for hemostasis.  CONTRAST: Total of 100 cc.  COMPLICATIONS: None.  HEMODYNAMICS: Aortic pressure was 128/58; LV pressure was 128/5; LVEDP 10. There was no gradient between the left ventricle and aorta.  ANGIOGRAPHIC DATA: The left main coronary artery is widely patent.  The left anterior descending artery is a large vessel with atherosclerosis in the midportion, at the origin of the second diagonal. The second diagonal is a large branching vessel across the lateral wall. There is a focal 95% stenosis proximally.  The left circumflex artery is a medium size vessel which is widely patent. There is moderate disease in the mid vessel, up to 40%. There is a large first obtuse marginal which branches across the lateral wall and is patent..  The right coronary artery is a large dominant vessel that is widely patent. The posterior descending artery is medium size and widely patent. The posterior lateral artery is a large vessel and widely patent.  LEFT VENTRICULOGRAM: Left ventricular angiogram was not done. LVEDP was 10 mmHg.  PCI NARRATIVE: A JL 3.5 guiding catheter was used to engage the left main. A BMW wire was placed across the stenosis in the diagonal. A 2.0 x 6 cutting balloon was used to treat the area of the proximal second diagonal. There is an excellent angiographic result. Intracoronary nitroglycerin was given. Heparin was used for anticoagulation. Several a CTs were checked.  IMPRESSIONS:  6. Normal left main coronary artery. 7. Mild mid vessel left anterior descending artery atherosclerosis. A large second diagonal has a 95% stenosis which was successfully treated with a 2.0 x 6 cutting balloon. There is an excellent Angiographic result. 8. Widely patent left  circumflex artery and its branches. 9. Normal right coronary artery. 10. Normal left ventricular systolic function by echo. LVEDP 10 mmHg.  RECOMMENDATION: Continue dual antiplatelet therapy for minimum of 2 weeks, but likely a month. No stent was placed so she has not committed to Plavix long-term. She does have a GI evaluation coming up which is one reason why we did not want to commit her to Plavix.   Discharge Medications     Medication List    STOP taking these medications       ibuprofen 200 MG tablet  Commonly known as:  ADVIL,MOTRIN      TAKE these medications       acetaminophen 500 MG tablet  Commonly known as:  TYLENOL  Take 500 mg by mouth daily as needed for mild pain or moderate pain.     Alpha-Lipoic Acid 600 MG Caps  Take 600 mg by mouth 2 (two) times daily.     amLODipine-benazepril 5-10 MG per capsule  Commonly known as:  LOTREL  Take 1 capsule by mouth at bedtime.     amoxicillin 500 MG capsule  Commonly known as:  AMOXIL  Take 500 mg by mouth 3 (three) times daily. For 7 days     aspirin EC 81 MG tablet  Take 1 tablet (81 mg total) by mouth daily.     Boron 3 MG Caps  Take 3 mg by mouth every evening.     CINNAMON PO  Take 250 mg by mouth 2 (two) times daily.     clopidogrel 75 MG tablet  Commonly known as:  PLAVIX  Take 1 tablet (75 mg total) by mouth daily with breakfast.     diclofenac sodium 1 % Gel  Commonly known as:  VOLTAREN  Apply 1 application topically daily as needed (pain).     escitalopram 5 MG tablet  Commonly known as:  LEXAPRO  Take 5 mg by mouth daily.     HYDROcodone-acetaminophen 10-325 MG per tablet  Commonly known as:  NORCO  Take 1 tablet by mouth every 6 (six) hours as needed for moderate pain or severe pain.     hydrocortisone 25 MG suppository  Commonly known as:  ANUSOL-HC  Place 25 mg rectally at bedtime.     hyoscyamine 0.125 MG SL tablet  Commonly known as:  LEVSIN SL  Place 0.125 mg under the tongue  every 4 (four) hours as needed for cramping.     Krill Oil 300 MG Caps  Take 300 mg by mouth every evening.     magic mouthwash Soln  Take 5 mLs by mouth 3 (three) times daily as needed for mouth pain.     meclizine 12.5 MG tablet  Commonly known as:  ANTIVERT  Take 25 mg by mouth 2 (two) times daily as needed for dizziness or nausea.     metoprolol succinate 25 MG 24 hr tablet  Commonly known as:  TOPROL-XL  Take 1 tablet (25 mg total) by mouth daily.     nabumetone 750 MG tablet  Commonly known as:  RELAFEN  Take 750 mg by mouth 2 (two) times daily.     omeprazole 20 MG capsule  Commonly known as:  PRILOSEC  Take 20 mg by mouth 2 (two) times daily.     OVER THE COUNTER MEDICATION  Take 1-2 capsules by mouth 2 (two) times daily. Glucose Essentials - 1 capsule every morning, 2 capsules every night     OVER THE COUNTER MEDICATION  Take 400 mg by mouth 2 (two) times daily. tumeric     OVER THE COUNTER MEDICATION  Take 1 capsule by mouth 2 (two) times daily. Integrative Digestive Formula     PRESERVISION AREDS 2 PO  Take 2 tablets by mouth daily.     PROBIOTIC DAILY PO  Take 1 capsule by mouth daily.     REFRESH OP  Place 1 drop into both eyes daily as needed (dry eyes).     trimethoprim 100 MG tablet  Commonly known as:  TRIMPEX  Take 100 mg by mouth at bedtime.     Vitamin B-12 2500 MCG Subl  Place 2,500 mcg under the tongue daily.     Vitamin D3 5000 UNITS Caps  Take 5,000 Units by mouth every evening.        Disposition   The patient will be discharged in stable condition to home. Discharge Orders   Future Appointments Provider Department Dept Phone   11/12/2013 9:50 AM Blair Dolphin Jorene Minors [place] [facility] [phone]   11/20/2013 10:00 AM Colon Branch, MD [facility]  [place]   11/25/2013 9:45 AM Charlett Blake[facility] [phone]   12/03/2013 3:30 PM Lafayette Dragon, MD  [place] [phone]   01/13/2014 10:00 AM Dorothy Spark, MD [facility] [phone]   Future Orders Complete By Expires   Diet - low sodium heart healthy  As directed    Diet Carb Modified  As directed    Increase activity slowly  As directed      Follow-up Information   Follow up with Richardson Dopp, PA-C. (11/12/13 at 9:50am)    Specialty:  Physician Assistant   Contact information:   [address] [place] [address] [phone]       Follow up with Delfin Edis, MD. (We have sent a message to Dr. Olevia Perches asking that she push back your colonoscopy. If you have not heard from her office within 3 business days, please call.)    Specialty:  Gastroenterology   Contact information:   [address] [place] [address] [phone]         Duration of Discharge Encounter: Greater than 30 minutes including physician and PA time.  SignedSuanne Marker Barrett PA-C 11/01/2013, 9:26 AM   Thompson Grayer MD

## 2013-10-31 NOTE — Progress Notes (Signed)
Utilization Review Completed.Donne Anon T1/06/2014

## 2013-10-31 NOTE — Discharge Instructions (Addendum)
PLEASE REMEMBER TO BRING ALL OF YOUR MEDICATIONS TO EACH OF YOUR FOLLOW-UP OFFICE VISITS. ° °PLEASE ATTEND ALL SCHEDULED FOLLOW-UP APPOINTMENTS.  ° °Activity: Increase activity slowly as tolerated. You may shower, but no soaking baths (or swimming) for 1 week. No driving for 2 days. No lifting over 5 lbs for 1 week. No sexual activity for 1 week.  ° °You May Return to Work: in 1 week (if applicable) ° °Wound Care: You may wash cath site gently with soap and water. Keep cath site clean and dry. If you notice pain, swelling, bleeding or pus at your cath site, please call [phone]. ° ° ° °Cardiac Cath Site Care °Refer to this sheet in the next few weeks. These instructions provide you with information on caring for yourself after your procedure. Your caregiver may also give you more specific instructions. Your treatment has been planned according to current medical practices, but problems sometimes occur. Call your caregiver if you have any problems or questions after your procedure. °HOME CARE INSTRUCTIONS °· You may shower 24 hours after the procedure. Remove the bandage (dressing) and gently wash the site with plain soap and water. Gently pat the site dry.  °· Do not apply powder or lotion to the site.  °· Do not sit in a bathtub, swimming pool, or whirlpool for 5 to 7 days.  °· No bending, squatting, or lifting anything over 10 pounds (4.5 kg) as directed by your caregiver.  °· Inspect the site at least twice daily.  °· Do not drive home if you are discharged the same day of the procedure. Have someone else drive you.  °· You may drive 24 hours after the procedure unless otherwise instructed by your caregiver.  °What to expect: °· Any bruising will usually fade within 1 to 2 weeks.  °· Blood that collects in the tissue (hematoma) may be painful to the touch. It should usually decrease in size and tenderness within 1 to 2 weeks.  °SEEK IMMEDIATE MEDICAL CARE IF: °· You have unusual pain at the site or down the  affected limb.  °· You have redness, warmth, swelling, or pain at the site.  °· You have drainage (other than a small amount of blood on the dressing).  °· You have chills.  °· You have a fever or persistent symptoms for more than 72 hours.  °· You have a fever and your symptoms suddenly get worse.  °· Your leg becomes pale, cool, tingly, or numb.  °· You have heavy bleeding from the site. Hold pressure on the site.  °Document Released: 11/11/2010 Document Revised: 09/28/2011 Document Reviewed: 11/11/2010 °[employer]® Patient Information ©2012 [employer]. ° °

## 2013-10-31 NOTE — CV Procedure (Signed)
       PROCEDURE:  Left heart catheterization with selective coronary angiography, left ventriculogram.  INDICATIONS:  Abnormal stress test, class III angina  The risks, benefits, and details of the procedure were explained to the patient.  The patient verbalized understanding and wanted to proceed.  Informed written consent was obtained.  PROCEDURE TECHNIQUE:  After Xylocaine anesthesia a 58F slender sheath was placed in the right radial artery with a single anterior needle wall stick.   IV heparin was given .  Right coronary angiography was done using a Judkins R4 guide catheter.  Left coronary angiography was done using a Judkins L3.5 guide catheter.  Left ventriculography was done using a pigtail catheter.  A TR band was used for hemostasis.   CONTRAST:  Total of 100 cc.  COMPLICATIONS:  None.    HEMODYNAMICS:  Aortic pressure was 128/58; LV pressure was 128/5; LVEDP 10.  There was no gradient between the left ventricle and aorta.    ANGIOGRAPHIC DATA:   The left main coronary artery is widely patent.  The left anterior descending artery is a large vessel with atherosclerosis in the midportion, at the origin of the second diagonal. The second diagonal is a large branching vessel across the lateral wall. There is a focal 95% stenosis proximally.  The left circumflex artery is a medium size vessel which is widely patent. There is moderate disease in the mid vessel, up to 40%. There is a large first obtuse marginal which branches across the lateral wall and is patent..  The right coronary artery is a large dominant vessel that is widely patent.  The posterior descending artery is medium size and widely patent. The posterior lateral artery is a large vessel and widely patent.  LEFT VENTRICULOGRAM:  Left ventricular angiogram was not done. LVEDP was 10 mmHg.  PCI NARRATIVE: A JL 3.5 guiding catheter was used to engage the left main. A BMW wire was placed across the stenosis in the diagonal.  A 2.0 x 6 cutting balloon was used to treat the area of the proximal second diagonal. There is an excellent angiographic result. Intracoronary nitroglycerin was given. Heparin was used for anticoagulation. Several a CTs were checked.  IMPRESSIONS:  1. Normal left main coronary artery. 2. Mild mid vessel left anterior descending artery atherosclerosis.  A large second diagonal has a 95% stenosis which was successfully treated with a 2.0 x 6 cutting balloon. There is an excellent Angiographic result. 3. Widely patent left circumflex artery and its branches. 4. Normal right coronary artery. 5. Normal left ventricular systolic function by echo.  LVEDP 10 mmHg.    RECOMMENDATION:  Continue dual antiplatelet therapy for minimum of 2 weeks, but likely a month. No stent was placed so she has not committed to Plavix long-term. She does have a GI evaluation coming up which is one reason why we did not want to commit her to Plavix.

## 2013-10-31 NOTE — H&P (View-Only) (Signed)
[address], [place], [place]  [address] Phone: [phone] Fax:  [phone]  Date:  10/29/2013   ID:  AWA BACHICHA, DOB 10/26/1934, MRN [id number]  PCP:  [name], MD  Cardiologist:  Dr. Thompson Grayer => Dr. Ena Dawley     History of Present Illness: [name] is a 79 y.o. female with a hx of HTN, HL, T2DM, orthostatic hypotension, prior DVT after neck surgery in 2010, s/p IVC filter, GERD and reflex sympathetic dystrophy.    She had an extensive workup for dyspnea in the past.  She had neck surgery in February 2009 and noted dyspnea afterwards.  Initially found to have pulmonary emboli and treated with coumadin for 1 year.  She saw Dr. Gwenette Greet at the end of 2010. VQ negative for recurrent emboli. CT chest showed small lung nodule but no signifcant parenchymal disease. Echo 12/10 with EF 65% and grade I diastolic dysfx. No signifcant valvular disease.  In 4/11 found to have moderate to large hiatal hernia with stomach protruding into chest. She had that repaired with no improvement in symptoms.  She was referred to Dr. Rayann Heman who had concern for possible CAD or platypnea/orthodeoxia.  She was ultimately referred to Dr. Glori Bickers.  Cath 05/2010 showed non-obstructive CAD (proximal LAD 20, mid LAD 40, proximal Dx 40-50, lower branch of the Dx 70-80, proximal CFX 30, EF 60%). RHC normal with RA 5 RV 31/2 PA 27/12 (19) PCW 10 CO normal. No evidence of shunting with sitting up in cath lab. TEE EF 60% trivial MR. Redundant atrial septum. No PFO. Bubble negative.  CPX test in 2011: Resting spriometry normal. pVO2 15.2 (95% predicted) adjusts to 20.7 when corrected for ideal body weight. ve/vco2 slope markedly elevated at 46. O2 pulse 100%. VE/MVV 56%. Sats 97% throughot exercise.  Dyspnea was felt to be mostly a ventilatory limitation. Could not exclude concomittant circulatory limitation.  She had fluoro of diaphragms. R diaphragm elevated at rest but both moved with inspiration.   Abdominal US (10/2011): No AAA.  Patient was recently reevaluated by Dr. Rayann Heman last month for edema and chest pain. Of note, the patient had an admission to the hospital in 04/2013 with Charcot collapse of the right foot with osteomyelitis and ulceration. She underwent excision of the osteomyelitis.  She spent time in rehabilitation after discharge. Edema was felt to likely be from venous insufficiency and immobility. HCTZ was added to her medical regimen. Echocardiogram (10/14/13): Mild LVH, mild focal basal hypertrophy of the septum, EF 60-65%, normal wall motion, grade 1 diastolic dysfunction, MAC, mild LAE, atrial septal aneurysm, PASP 34. There was a possible oscillating density on the mitral valve on parasternal long axis views (suggest TEE if indicated).  Patient was set up for [place] to rule out ischemia. LexiScan Myoview (10/20/13): Intermediate risk; medium-size reversible defect in the mid LAD territory, EF 68%.  She returns for follow up.  She continues to note chest pressure.  This mainly occurs with meals.  She takes antacids with relief. She has radiation of symptoms to bilateral arms and her back.  She denies assoc dyspnea.  She is deconditioned from her rehab stay after foot surgery.  No syncope.  No significant LE edema.  No orthopnea, PND.    Recent Labs: 09/17/2013: ALT 12; Creatinine 1.0; Direct LDL 117.9; HDL 39.30; Hemoglobin 12.1; Potassium 3.7; TSH 1.91   Wt Readings from Last 3 Encounters:  10/20/13 197 lb (89.359 kg)  10/14/13 202 lb  4 oz (91.74 kg)  09/22/13 206 lb 12.8 oz (93.804 kg)     Past Medical History  Diagnosis Date  . Hyperlipidemia   . Asthma   . Osteoporosis   . DVT (deep venous thrombosis) 12/2007    PE DVT after neck surgery, coumadin d/c 10-2008  . PE (pulmonary embolism) 12/2007  . Hiatal hernia     s/p repair   . Pulmonary nodule     incidental per CT:  Pet scan 4-9: likely benign, CT 08-2009 no change, no further CTs (Dr. Gwenette Greet)  .  Hemorrhoids   . IBS (irritable bowel syndrome)   . Colon polyp 1994    malignant  . Diverticulosis   . PPD positive   . Macular degeneration 03/2009    Dr. Rosana Hoes  . Allergic rhinitis   . Dyspnea     chronic, extensive w/u see OV note 09-2010  . RSD (reflex sympathetic dystrophy)   . DJD (degenerative joint disease)   . Spinal stenosis   . GERD (gastroesophageal reflux disease)   . Syncope     seen Dr. Haroldine Laws  . Hypertension     sees Dr. Larose Kells, primary  . Diabetes mellitus     not on medications, controls with diet  . Chronic urinary tract infection     10-22-13- MD visit yesterday for ? UTI  . Headache(784.0)     migraines when younger  . Neuropathy     "has in hands and feet and legs"  . Hyperthyroidism     Current Outpatient Prescriptions  Medication Sig Dispense Refill  . acetaminophen (TYLENOL) 500 MG tablet Take 500 mg by mouth daily as needed for mild pain or moderate pain.       . Alpha-Lipoic Acid 600 MG CAPS Take 600 mg by mouth 2 (two) times daily.      . Alum & Mag Hydroxide-Simeth (MAGIC MOUTHWASH) SOLN Take 5 mLs by mouth 3 (three) times daily as needed for mouth pain.  90 mL  0  . amLODipine-benazepril (LOTREL) 5-10 MG per capsule Take 1 capsule by mouth at bedtime.      . Boron 3 MG CAPS Take 3 mg by mouth every evening.       . Cholecalciferol (VITAMIN D3) 5000 UNITS CAPS Take 5,000 Units by mouth every evening.      Marland Kitchen CINNAMON PO Take 250 mg by mouth 2 (two) times daily.       . Cyanocobalamin (VITAMIN B-12) 2500 MCG SUBL Place 2,500 mcg under the tongue daily.      . diclofenac sodium (VOLTAREN) 1 % GEL Apply 1 application topically daily as needed (pain).      Marland Kitchen escitalopram (LEXAPRO) 5 MG tablet Take 5 mg by mouth daily.      Marland Kitchen HYDROcodone-acetaminophen (NORCO) 10-325 MG per tablet Take 1 tablet by mouth every 6 (six) hours as needed for moderate pain or severe pain.  30 tablet  0  . hydrocortisone (ANUSOL-HC) 25 MG suppository Place 25 mg rectally at  bedtime.      . hyoscyamine (LEVSIN SL) 0.125 MG SL tablet Place 0.125 mg under the tongue every 4 (four) hours as needed for cramping.      Marland Kitchen ibuprofen (ADVIL,MOTRIN) 200 MG tablet Take 400 mg by mouth daily as needed for mild pain or moderate pain.       Javier Docker Oil 300 MG CAPS Take 300 mg by mouth every evening.       Tonye Royalty (LINZESS) 145  MCG CAPS capsule Take 145 mcg by mouth daily with breakfast.      . meclizine (ANTIVERT) 12.5 MG tablet Take 25 mg by mouth 2 (two) times daily as needed for dizziness or nausea.       Marland Kitchen MOVIPREP 100 G SOLR Take 1 kit (200 g total) by mouth once.  1 kit  0  . Multiple Vitamins-Minerals (PRESERVISION AREDS 2 PO) Take 2 tablets by mouth daily.      . nabumetone (RELAFEN) 750 MG tablet Take 750 mg by mouth 2 (two) times daily.      Marland Kitchen omeprazole (PRILOSEC) 20 MG capsule Take 20 mg by mouth 2 (two) times daily.      Marland Kitchen OVER THE COUNTER MEDICATION Take 1-2 capsules by mouth 2 (two) times daily. Glucose Essentials - 1 capsule every morning, 2 capsules every night      . OVER THE COUNTER MEDICATION Take 400 mg by mouth 2 (two) times daily. tumeric      . OVER THE COUNTER MEDICATION Take 1 capsule by mouth 2 (two) times daily. Integrative Digestive Formula      . Polyvinyl Alcohol-Povidone (REFRESH OP) Place 1 drop into both eyes daily as needed (dry eyes).      . Probiotic Product (PROBIOTIC DAILY PO) Take 1 capsule by mouth daily.      Marland Kitchen trimethoprim (TRIMPEX) 100 MG tablet Take 100 mg by mouth at bedtime.       No current facility-administered medications for this visit.    Allergies:   Cymbalta; Gabapentin; Adhesive; Cefazolin; Ciprofloxacin; Clorazepate dipotassium; Darifenacin hydrobromide; Dilaudid; Enablex; Lyrica; Methadone hcl; Pentazocine lactate; Talwin; and Doxycycline   Social History:  The patient  reports that she has never smoked. She has never used smokeless tobacco. She reports that she does not drink alcohol or use illicit drugs.   Family  History:  The patient's family history includes Breast cancer in an other family member; Diabetic kidney disease in her daughter; Heart disease in her mother; Hypertension in an other family member; Migraines in an other family member; Rheum arthritis in her mother. There is no history of Colon cancer.   ROS:  Please see the history of present illness.   She notes increased belching.  She has an EGD and colo with Dr. Olevia Perches later this month.  She has had recent UTI.  She has had recent diarrhea.  All other systems reviewed and negative.   PHYSICAL EXAM: VS:  BP 150/73  Pulse 90  Ht _0  (1.626 m)  Wt 195 lb 12.8 oz (88.814 kg)  BMI 33.59 kg/m2 Well nourished, well developed, in no acute distress HEENT: normal Neck: no JVD at 90 Cardiac:  normal S1, S2; RRR; no murmur Lungs:  clear to auscultation bilaterally, no wheezing, rhonchi or rales Abd: soft, nontender, no hepatomegaly Ext: trace bilateral LE edema Skin: warm and dry Neuro:  CNs 2-12 intact, no focal abnormalities noted  EKG:  NSR, HR 90, normal axis, T-wave inversions in 1 and aVL     ASSESSMENT AND PLAN:  1. Chest Pain:  She has a history of nonobstructive disease by cardiac catheterization in 2011. She has a history of diabetes. She has atypical >atypical chest symptoms. Most of her symptoms seem to be related to meals. However, she does have significant radiation to her arms and back. Recent Myoview is intermediate risk.  She now has lateral TWI on ECG (new).  I have discussed her case with Dr. Rayann Heman. We have recommended proceeding  with cardiac catheterization.  Risks and benefits of cardiac catheterization have been discussed with the patient.  These include bleeding, infection, kidney damage, stroke, heart attack, death.  The patient understands these risks and is willing to proceed.  2. CAD:  Start ASA 81 mg QD and Toprol XL 25 mg QD.  She is unwilling to use statins.  Proceed with LHC as noted. 3. Hypertension:  Borderline  control.  Continue Lotrel.  Start Toprol XL 25 QD. 4. Abnormal Echocardiogram:  I asked Dr. Ena Dawley to review her echo.  No significant abnormality other than MAC noted with her MV.  She does not need TEE.  5. Disposition:  Proceed with cath this week with Dr. Casandra Doffing.    Signed, Richardson Dopp, PA-C  10/29/2013 8:40 AM

## 2013-10-31 NOTE — Interval H&P Note (Signed)
Cath Lab Visit (complete for each Cath Lab visit)  Clinical Evaluation Leading to the Procedure:   ACS: no  Non-ACS:    Anginal Classification: CCS III  Anti-ischemic medical therapy: Minimal Therapy (1 class of medications)  Non-Invasive Test Results: Intermediate-risk stress test findings: cardiac mortality 1-3%/year  Prior CABG: No previous CABG      History and Physical Interval Note:  10/31/2013 7:47 AM  [name]  has presented today for surgery, with the diagnosis of abnormal stress test/cp  The various methods of treatment have been discussed with the patient and family. After consideration of risks, benefits and other options for treatment, the patient has consented to  Procedure(s): LEFT HEART CATHETERIZATION WITH CORONARY ANGIOGRAM (N/A) as a surgical intervention .  The patient's history has been reviewed, patient examined, no change in status, stable for surgery.  I have reviewed the patient's chart and labs.  Questions were answered to the patient's satisfaction.     [name] S.

## 2013-11-01 DIAGNOSIS — I10 Essential (primary) hypertension: Secondary | ICD-10-CM | POA: Diagnosis not present

## 2013-11-01 DIAGNOSIS — R9439 Abnormal result of other cardiovascular function study: Secondary | ICD-10-CM | POA: Diagnosis not present

## 2013-11-01 DIAGNOSIS — E785 Hyperlipidemia, unspecified: Secondary | ICD-10-CM | POA: Diagnosis not present

## 2013-11-01 DIAGNOSIS — M810 Age-related osteoporosis without current pathological fracture: Secondary | ICD-10-CM | POA: Diagnosis not present

## 2013-11-01 DIAGNOSIS — I200 Unstable angina: Secondary | ICD-10-CM | POA: Diagnosis not present

## 2013-11-01 DIAGNOSIS — E119 Type 2 diabetes mellitus without complications: Secondary | ICD-10-CM | POA: Diagnosis not present

## 2013-11-01 DIAGNOSIS — I2510 Atherosclerotic heart disease of native coronary artery without angina pectoris: Secondary | ICD-10-CM | POA: Diagnosis not present

## 2013-11-01 LAB — BASIC METABOLIC PANEL
BUN: 11 mg/dL (ref 6–23)
CO2: 25 mEq/L (ref 19–32)
Calcium: 8.6 mg/dL (ref 8.4–10.5)
Chloride: 103 mEq/L (ref 96–112)
Creatinine, Ser: 0.68 mg/dL (ref 0.50–1.10)
GFR calc Af Amer: >90 mL/min (ref >90)
GFR calc non Af Amer: 83 mL/min — ABNORMAL LOW (ref >90)
Glucose, Bld: 95 mg/dL (ref 70–99)
Potassium: 4 mEq/L (ref 3.7–5.3)
Sodium: 140 mEq/L (ref 137–147)

## 2013-11-01 LAB — CBC
HCT: 30.7 % — ABNORMAL LOW (ref 36.0–46.0)
Hemoglobin: 10.1 g/dL — ABNORMAL LOW (ref 12.0–15.0)
MCH: 28.9 pg (ref 26.0–34.0)
MCHC: 32.9 g/dL (ref 30.0–36.0)
MCV: 87.7 fL (ref 78.0–100.0)
Platelets: 171 10*3/uL (ref 150–400)
RBC: 3.5 MIL/uL — ABNORMAL LOW (ref 3.87–5.11)
RDW: 14.6 % (ref 11.5–15.5)
WBC: 5.5 10*3/uL (ref 4.0–10.5)

## 2013-11-01 LAB — GLUCOSE, CAPILLARY
Glucose-Capillary: 104 mg/dL — ABNORMAL HIGH (ref 70–99)
Glucose-Capillary: 110 mg/dL — ABNORMAL HIGH (ref 70–99)

## 2013-11-01 MED ORDER — CLOPIDOGREL BISULFATE 75 MG PO TABS: 75.0000 mg | ORAL_TABLET | Freq: Every day | ORAL | Status: DC

## 2013-11-01 NOTE — Progress Notes (Signed)
    Primary cardiologist:  Subjective:   No complaints    Objective:   Temp:  [97.6 F (36.4 C)-98.3 F (36.8 C)] 98.2 F (36.8 C) (01/10 0727) Pulse Rate:  [66-82] 66 (01/10 0727) Resp:  [18-20] 18 (01/10 0727) BP: (110-169)/(31-74) 119/49 mmHg (01/10 0727) SpO2:  [92 %-99 %] 98 % (01/10 0727) Weight:  [199 lb 4.7 oz (90.4 kg)] 199 lb 4.7 oz (90.4 kg) (01/10 0024) Last BM Date: 10/31/13  Filed Weights   10/31/13 0605 11/01/13 0024  Weight: 198 lb (89.812 kg) 199 lb 4.7 oz (90.4 kg)    Intake/Output Summary (Last 24 hours) at 11/01/13 0743 Last data filed at 10/31/13 2218  Gross per 24 hour  Intake    520 ml  Output   1650 ml  Net  -1130 ml    Telemetry: sinus rhythm  Exam:  General: NAD  Resp:CTAB  Cardiac: RRR, no m/r/g, no JVD, no carotid bruits  ZH:GDJMEQA soft, NT, ND  MSK: warm, no edema. Right wrist has no hematoma, no bruising  Neuro: no focal deficits   Lab Results:  Basic Metabolic Panel:  Recent Labs Lab 10/29/13 1100 11/01/13 0255  NA 139 140  K 3.6 4.0  CL 98 103  CO2 30 25  GLUCOSE 112* 95  BUN 9 11  CREATININE 0.8 0.68  CALCIUM 8.8 8.6    Liver Function Tests: No results found for this basename: AST, ALT, ALKPHOS, BILITOT, PROT, ALBUMIN,  in the last 168 hours  CBC:  Recent Labs Lab 10/31/13 0600 11/01/13 0255  WBC 6.1 5.5  HGB 11.0* 10.1*  HCT 33.8* 30.7*  MCV 87.8 87.7  PLT 195 171    Cardiac Enzymes: No results found for this basename: CKTOTAL, CKMB, CKMBINDEX, TROPONINI,  in the last 168 hours  BNP: No results found for this basename: PROBNP,  in the last 8760 hours  Coagulation:  Recent Labs Lab 10/31/13 0600  INR 1.04    ECG:   Medications:   Scheduled Medications: . amLODipine  5 mg Oral QHS  . amoxicillin  500 mg Oral TID  . aspirin  81 mg Oral Daily  . benazepril  10 mg Oral QHS  . clopidogrel  75 mg Oral Q breakfast  . escitalopram  5 mg Oral Daily  . hydrocortisone  25 mg Rectal QHS   . metoprolol succinate  25 mg Oral Daily  . nabumetone  750 mg Oral BID  . pantoprazole  40 mg Oral Daily     Infusions:     PRN Medications:  acetaminophen, acetaminophen, HYDROcodone-acetaminophen, hyoscyamine, magic mouthwash, ondansetron (ZOFRAN) IV     Assessment/Plan   79 yo female hx fo HTN, HL, DM, orthostatic hypotension admitted with chest pain.  1. CAD - intermediate risk stress test - cath yesterday showed second diagonal 95% stenosis treated with cutting balloon - recs per interventional cardiology is minimum of 2 weeks of DAPT if not longer as no stent was placed due to concerns of GI issues. Will continue DAPT until follow up. Would not stop earlier than 2 weeks for any procedures. Will give plavix prescription for 30 days with refills.  - she will follow up in 2-3 weeks.  - discussed adding a statin in the setting of obstructive CAD, she is resistant to starting due to concern about sideeffects. This will be readdressed at follow up, will not start at discharge.        Carlyle Dolly, M.D., F.A.C.C.

## 2013-11-01 NOTE — Progress Notes (Signed)
CARDIAC REHAB PHASE I   PRE:  Rate/Rhythm: 82  BP:  Supine: 119/49  Sitting:   Standing:    SaO2: 98 RA  MODE:  Ambulation: 200 ft   POST:  Rate/Rhythem: 79  BP:  Supine: 95/63  Sitting:   Standing:    SaO2: 98 RA  7:28 to 852 am  /Referral received, and appreciated.  Patient oriented to Cardiac Rehab Phase  1 and Phase 2.  Ambulated with assistance x 1 using walker.  Pace is slow, steady with the walker.  No increased dyspnea or discomfort with the walk.  Education completed, modifications due to the difficulties she has with her feet.    [name]

## 2013-11-03 ENCOUNTER — Ambulatory Visit: Payer: Medicare Other | Admitting: Physician Assistant

## 2013-11-03 ENCOUNTER — Telehealth: Payer: Self-pay | Admitting: Internal Medicine

## 2013-11-03 ENCOUNTER — Other Ambulatory Visit: Payer: Medicare Other

## 2013-11-03 ENCOUNTER — Telehealth: Payer: Self-pay | Admitting: Cardiology

## 2013-11-03 NOTE — Telephone Encounter (Signed)
New message    Pt had cath last Friday----daughter has questions about medications

## 2013-11-03 NOTE — Telephone Encounter (Signed)
Please stay on Hyoscyamine

## 2013-11-03 NOTE — Telephone Encounter (Signed)
Patient is asking for 90 day Hyoscyamine rx. Spoke with patient about cancelling the colonoscopy due to her being on Plavix. She will reschedule this after she completes Plavix. Patient is asking if she needs to stay on Hyoscyamine. Please, advise.

## 2013-11-03 NOTE — Telephone Encounter (Signed)
**Note De-Identified  Obfuscation** Pts daughter, [name], is advised that the pt should continue to take Plavix until her f/u with Kathleen Argue, [place] on 1/21. [name] is advised to discuss with Kathleen Argue at that time. She verbalized understanding.

## 2013-11-04 DIAGNOSIS — J189 Pneumonia, unspecified organism: Secondary | ICD-10-CM | POA: Diagnosis not present

## 2013-11-04 MED ORDER — HYOSCYAMINE SULFATE 0.125 MG SL SUBL: 0.1250 mg | SUBLINGUAL_TABLET | SUBLINGUAL | Status: DC | PRN

## 2013-11-04 NOTE — Telephone Encounter (Signed)
Spoke with patient's daughter and gave her Dr. Nichola Sizer recommendation to cancel procedures and to let her know rx has been sent.

## 2013-11-04 NOTE — Telephone Encounter (Signed)
Please cancel EGD as well. She should stay on Hyoscyamine as prescribed.  Previous Messages  Rx sent for Hyoscymaine.

## 2013-11-04 NOTE — Telephone Encounter (Signed)
Thanks for letting me know, [name], it will be the best to cancel her colonoscopy until she is off Plavix. The exam is not urgent. I will cancel her procedure  [name], could you, please cancel [name]'s colonoscopy and let her know that we will reschedule it after she completes the Plavix treatment. Thanx DB  Previous Messages   Cancelled procedure at [facility] on 11/10/13.

## 2013-11-10 ENCOUNTER — Ambulatory Visit (HOSPITAL_COMMUNITY): Admission: RE | Admit: 2013-11-10 | Payer: Medicare Other | Source: Ambulatory Visit | Admitting: Internal Medicine

## 2013-11-10 SURGERY — COLONOSCOPY
Anesthesia: Monitor Anesthesia Care

## 2013-11-12 ENCOUNTER — Encounter: Payer: Medicare Other | Admitting: Physician Assistant

## 2013-11-15 DIAGNOSIS — J189 Pneumonia, unspecified organism: Secondary | ICD-10-CM | POA: Diagnosis not present

## 2013-11-17 ENCOUNTER — Ambulatory Visit: Payer: Medicare Other | Admitting: Internal Medicine

## 2013-11-20 ENCOUNTER — Ambulatory Visit: Payer: Medicare Other | Admitting: Internal Medicine

## 2013-11-24 ENCOUNTER — Other Ambulatory Visit: Payer: Self-pay | Admitting: Internal Medicine

## 2013-11-25 ENCOUNTER — Ambulatory Visit: Payer: Medicare Other | Admitting: Physical Medicine & Rehabilitation

## 2013-11-25 ENCOUNTER — Encounter: Payer: Medicare Other | Admitting: Physician Assistant

## 2013-11-25 NOTE — Telephone Encounter (Signed)
Meclizine refilled per protocol. JG//CMA

## 2013-12-03 ENCOUNTER — Telehealth: Payer: Self-pay | Admitting: Internal Medicine

## 2013-12-03 ENCOUNTER — Encounter: Payer: Medicare Other | Admitting: Internal Medicine

## 2013-12-04 DIAGNOSIS — J8409 Other alveolar and parieto-alveolar conditions: Secondary | ICD-10-CM | POA: Diagnosis not present

## 2013-12-13 DIAGNOSIS — J8409 Other alveolar and parieto-alveolar conditions: Secondary | ICD-10-CM | POA: Diagnosis not present

## 2013-12-24 ENCOUNTER — Telehealth: Payer: Self-pay | Admitting: Internal Medicine

## 2013-12-24 MED ORDER — HYDROCODONE-ACETAMINOPHEN 10-325 MG PO TABS: 1.0000 | ORAL_TABLET | Freq: Four times a day (QID) | ORAL | Status: DC | PRN

## 2013-12-24 NOTE — Telephone Encounter (Signed)
rx refill - hydrocodone 10-325mg   Last OV-09/17/13  Last refilled- 10/29/13 # 120 / 0 rf  Last UDS 01/31/13 LOW

## 2013-12-24 NOTE — Telephone Encounter (Signed)
OK to RF, see prescription. Also, she is due for a 30 minutes checkup  , please schedule

## 2013-12-24 NOTE — Addendum Note (Signed)
Addended by: [name] E on: 12/24/2013 05:25 PM   Modules accepted: Orders

## 2013-12-24 NOTE — Telephone Encounter (Signed)
Patient called and requested a refill for HYDROcodone-acetaminophen (NORCO) 10-325 MG per tablet  

## 2013-12-25 NOTE — Telephone Encounter (Signed)
Pt notified. rx at front desk rdy for pick up.

## 2013-12-30 DIAGNOSIS — M654 Radial styloid tenosynovitis [de Quervain]: Secondary | ICD-10-CM | POA: Diagnosis not present

## 2013-12-31 ENCOUNTER — Telehealth: Payer: Self-pay | Admitting: Physician Assistant

## 2013-12-31 ENCOUNTER — Ambulatory Visit (INDEPENDENT_AMBULATORY_CARE_PROVIDER_SITE_OTHER): Payer: Medicare Other | Admitting: Pulmonary Disease

## 2013-12-31 ENCOUNTER — Encounter: Payer: Self-pay | Admitting: Pulmonary Disease

## 2013-12-31 VITALS — BP 126/80 | HR 75 | Temp 98.0°F | Ht 64.0 in | Wt 195.0 lb

## 2013-12-31 DIAGNOSIS — I2510 Atherosclerotic heart disease of native coronary artery without angina pectoris: Secondary | ICD-10-CM | POA: Diagnosis not present

## 2013-12-31 DIAGNOSIS — J984 Other disorders of lung: Secondary | ICD-10-CM

## 2013-12-31 DIAGNOSIS — J189 Pneumonia, unspecified organism: Secondary | ICD-10-CM

## 2013-12-31 NOTE — Telephone Encounter (Signed)
I tried to reach pt to s/w her about surg clearance and plavix. I d/w Brynda Rim. PA and he states pt will need to be seen before she can be cleared for surgery. Lat time seen was 10/29/13 w/Scott W. PA. I will try pt again tomorrow.

## 2013-12-31 NOTE — Assessment & Plan Note (Signed)
The patient never had her final followup CT chest to finish her surveillance of her right middle lobe nodule. This will need to be done, and will also allow [place] to evaluate her recent pneumonia to see if it has totally clear.

## 2013-12-31 NOTE — Patient Instructions (Signed)
Will schedule for scan of chest to evaluate for clearing of your recent ?pneumonia, as well as re-evaluating your prior lung spot.  Will call you with results.

## 2013-12-31 NOTE — Assessment & Plan Note (Signed)
The patient was recently diagnosed with a community-acquired pneumonia at urgent care, and was treated with multiple rounds of antibiotics. She was told that she had a persistent abnormality on her chest x-ray, but was unclear if it was old or represented incomplete clearing of her pneumonic process. The patient currently has no significant symptoms to suggest pneumonia. She needs a CT chest to followup her nodule, and we can reevaluate her prior infiltrate as well.

## 2013-12-31 NOTE — Progress Notes (Signed)
   Subjective:    Patient ID: [name], female    DOB: 1934-12-18, 79 y.o.   MRN: [id number]  HPI The patient is a 79 year old female who comes in today as a self referral for evaluation of pneumonia. I have seen her in the distant past for dyspnea on exertion that had a completely negative pulmonary workup. She also had a pulmonary nodule noted, and never had her final CT surveillance at 2 years.  She tells me that in January she developed a cough, and was evaluated at urgent care where they thought that she had a pneumonia radiographically.  She tells me that she had small quantities of purulent mucus at the time, but no fever chest pain. She tells me that she was treated with 3 rounds of antibiotics, and followup chest x-rays were apparently improved but not normal by her history. These images are not available to me. She currently has no chest congestion, minimal cough, and no significant mucus. She feels that her breathing is adequate at rest, but continues to have dyspnea on exertion similar to in the past. She recently had a cardiac catheterization with percutaneous intervention. She is also had an IVC filter placed because of her multiple needed procedures and history of PE in the past.   Review of Systems  Constitutional: Negative for fever and unexpected weight change.  HENT: Negative for congestion, dental problem, ear pain, nosebleeds, postnasal drip, rhinorrhea, sinus pressure, sneezing, sore throat and trouble swallowing.   Eyes: Negative for redness and itching.  Respiratory: Positive for cough and shortness of breath. Negative for chest tightness and wheezing.   Cardiovascular: Negative for palpitations and leg swelling.  Gastrointestinal: Negative for nausea and vomiting.  Genitourinary: Negative for dysuria.  Musculoskeletal: Negative for joint swelling.  Skin: Negative for rash.  Neurological: Negative for headaches.  Hematological: Does not bruise/bleed easily.   Psychiatric/Behavioral: Negative for dysphoric mood. The patient is not nervous/anxious.        Objective:   Physical Exam Constitutional:  Obese female,  no acute distress  HENT:  Nares patent without discharge  Oropharynx without exudate, palate and uvula are normal  Eyes:  Perrla, eomi, no scleral icterus  Neck:  No JVD, no TMG  Cardiovascular:  Normal rate, regular rhythm, no rubs or gallops.  No murmurs        Intact distal pulses  Pulmonary :  Normal breath sounds, no stridor or respiratory distress   No rales, rhonchi, or wheezing  Abdominal:  Soft, nondistended, bowel sounds present.  No tenderness noted.   Musculoskeletal:  2-3+ lower extremity edema noted.  Lymph Nodes:  No cervical lymphadenopathy noted  Skin:  No cyanosis noted  Neurologic:  Alert, appropriate, moves all 4 extremities without obvious deficit.         Assessment & Plan:

## 2013-12-31 NOTE — Telephone Encounter (Signed)
New patient   Patient needs surgical clearance to come off Plavix for surgery w/Dr. Maia Petties. Please fax clearance to [phone]. If any questions please call patient.

## 2014-01-01 DIAGNOSIS — H353190 Nonexudative age-related macular degeneration, unspecified eye, stage unspecified: Secondary | ICD-10-CM | POA: Diagnosis not present

## 2014-01-01 DIAGNOSIS — H524 Presbyopia: Secondary | ICD-10-CM | POA: Diagnosis not present

## 2014-01-01 LAB — HM DIABETES EYE EXAM

## 2014-01-01 NOTE — Telephone Encounter (Signed)
pt states having injections in her back, has appt w/Scott W. PA 3/19 for clearance 

## 2014-01-01 NOTE — Telephone Encounter (Signed)
pt states having injections in her back, has appt w/Scott W. PA 3/19 for clearance

## 2014-01-06 ENCOUNTER — Ambulatory Visit: Payer: Medicare Other | Admitting: Internal Medicine

## 2014-01-07 ENCOUNTER — Encounter: Payer: Self-pay | Admitting: Internal Medicine

## 2014-01-08 ENCOUNTER — Ambulatory Visit (INDEPENDENT_AMBULATORY_CARE_PROVIDER_SITE_OTHER): Payer: Medicare Other | Admitting: Physician Assistant

## 2014-01-08 ENCOUNTER — Encounter: Payer: Self-pay | Admitting: Physician Assistant

## 2014-01-08 ENCOUNTER — Ambulatory Visit (INDEPENDENT_AMBULATORY_CARE_PROVIDER_SITE_OTHER)
Admission: RE | Admit: 2014-01-08 | Discharge: 2014-01-08 | Disposition: A | Discharge status: Home / Self Care | Payer: Medicare Other | Source: Ambulatory Visit | Attending: Pulmonary Disease | Admitting: Pulmonary Disease

## 2014-01-08 VITALS — BP 118/70 | HR 74 | Ht 64.0 in | Wt 190.0 lb

## 2014-01-08 DIAGNOSIS — R079 Chest pain, unspecified: Secondary | ICD-10-CM | POA: Diagnosis not present

## 2014-01-08 DIAGNOSIS — R911 Solitary pulmonary nodule: Secondary | ICD-10-CM | POA: Diagnosis not present

## 2014-01-08 DIAGNOSIS — I10 Essential (primary) hypertension: Secondary | ICD-10-CM | POA: Diagnosis not present

## 2014-01-08 DIAGNOSIS — I2510 Atherosclerotic heart disease of native coronary artery without angina pectoris: Secondary | ICD-10-CM | POA: Diagnosis not present

## 2014-01-08 DIAGNOSIS — Z01810 Encounter for preprocedural cardiovascular examination: Secondary | ICD-10-CM

## 2014-01-08 DIAGNOSIS — J984 Other disorders of lung: Secondary | ICD-10-CM

## 2014-01-08 NOTE — Progress Notes (Signed)
[address], [place], [place]  [address] Phone: [phone] Fax:  [phone]  Date:  01/08/2014   ID:  [name], DOB 1934/11/11, MRN [id number]  PCP:  [name], MD  Cardiologist:  Dr. Thompson Grayer => Dr. Ena Dawley     History of Present Illness: [name] is a 79 y.o. female with a hx of HTN, HL, T2DM, orthostatic hypotension, prior DVT after neck surgery in 2010, s/p IVC filter, GERD and reflex sympathetic dystrophy.    She had an extensive workup for dyspnea in the past.  Please see my last office visit note from 10/29/2013 for complete details.  Patient was evaluated by Dr. Rayann Heman in January for chest pain and shortness of breath.  Myoview was abnormal and cardiac catheterization was recommended. Patient was admitted 1/9-1/10. Cardiac catheterization demonstrated high-grade disease in the second diagonal which was treated with cutting balloon angioplasty.  Patient was to have GI evaluation in the near future (EGD and colonoscopy).  She had a recent history of dysphagia as well as rectal bleeding with prior history of adenomatous polyps and in situ carcinoma.  Therefore, long term commitment to dual antiplatelet Rx was to be avoided.    She has noted recurrent left-sided chest discomfort. She feels as though that this is indigestion. She has had this for years without significant change. She is fairly sedentary. She cannot tell me if she has had significant improvement with any symptoms since her angioplasty. She denies associated dyspnea. She denies exertional chest discomfort. She does take Gas-X for her chest discomfort with relief.  Overall, she describes NYHA class 2-2b symptoms.  She denies syncope. She denies significant pedal edema.  She is requesting clearance to undergo epidural steroid injections as well as EGD/colonoscopy.  Echocardiogram (10/14/13): Mild LVH, mild focal basal hypertrophy of the septum, EF 60-65%, normal wall motion, grade 1 diastolic  dysfunction, MAC, mild LAE, atrial septal aneurysm, PASP 34. There was a possible oscillating density on the mitral valve on parasternal long axis views (related to MAC).   LexiScan Myoview (10/20/13): Intermediate risk; medium-size reversible defect in the mid LAD territory, EF 68%. LHC (10/31/13): Proximal D2 95, mid circumflex 40, LVEDP 10.  PCI:  Cutting Balloon angioplasty to D2.   Recent Labs: 09/17/2013: ALT 12; Direct LDL 117.9; HDL Cholesterol 39.30; TSH 1.91  11/01/2013: Creatinine 0.68; Hemoglobin 10.1*; Potassium 4.0   Wt Readings from Last 3 Encounters:  01/08/14 190 lb (86.183 kg)  12/31/13 195 lb (88.451 kg)  11/01/13 199 lb 4.7 oz (90.4 kg)     Past Medical History  Diagnosis Date  . Hyperlipidemia     a. patient unwilling to use statins.  . Osteoporosis   . DVT (deep venous thrombosis) 12/2007  . PE (pulmonary embolism) 12/2007    a. PE/DVT after neck surgery 2009. b. coumadin d/c 10-2008.  . Pulmonary nodule     incidental per CT:  Pet scan 4-9: likely benign, CT 08-2009 no change, no further CTs (Dr. Gwenette Greet)  . Hemorrhoids   . IBS (irritable bowel syndrome)   . Diverticulosis   . PPD positive   . Macular degeneration 03/2009    Dr. Rosana Hoes  . Allergic rhinitis   . Dyspnea     a. Chronic, extensive w/u see OV note 09-2010. b. [place] 2011: ormal with RA 5 RV 31/2 PA 27/12 (19) PCW 10 CO normal. No evidence of shunting with sitting up in cath lab. c. CPX 2011: see report.  d. Prior fluoro of diaphragm -  R diaphragm elevated at rest but both moved with inspiration.   Marland Kitchen Spinal stenosis   . GERD (gastroesophageal reflux disease)     a. Hx GERD/esophageal dysmotility followed by Dr. Olevia Perches.   . Syncope     seen Dr. Haroldine Laws  . Hypertension   . Neuropathy     a. Hands, feet, legs.  . Orthostatic hypotension   . CAD (coronary artery disease)     a. Nonobst in 2011. b. Abnormal nuc 09/2013 -> s/p cutting balloon to D2; mild LAD disease 10/31/13.  . Osteomyelitis     a. Adm  04/2013: Charcot collapse of the right foot with osteomyelitis and ulceration, s/p excision.  . Venous insufficiency     a. Contributing to LEE.  . Type II diabetes mellitus   . DJD (degenerative joint disease)   . Arthritis     "back; ankles; hands; knees" (10/31/2013)  . Recurrent UTI   . 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 1994 - malignant polyp removed during colonoscopy.  . Hiatal hernia     a. s/p Nissen fundoplication [id number].  Marland Kitchen RSD (reflex sympathetic dystrophy)     a. Chronic pain.  . Abnormal echocardiogram     09/2013: mild LVH, mild focal basal hypertrophy of septum, EF 60-65%, normal WM, grade 1 diastolic dysfunction, MAC, mild LAE, ASA, PASP 34. Possible oscillating MV density - reviewed by MD and felt there was no significant abnormality other than MAC noted with mitral valve and did not require TEE.    Current Outpatient Prescriptions  Medication Sig Dispense Refill  . acetaminophen (TYLENOL) 500 MG tablet Take 500 mg by mouth daily as needed for mild pain or moderate pain.       . Alpha-Lipoic Acid 600 MG CAPS Take 600 mg by mouth 2 (two) times daily.      . Alum & Mag Hydroxide-Simeth (MAGIC MOUTHWASH) SOLN Take 5 mLs by mouth 3 (three) times daily as needed for mouth pain.  90 mL  0  . amLODipine-benazepril (LOTREL) 5-10 MG per capsule Take 1 capsule by mouth at bedtime.      Marland Kitchen aspirin EC 81 MG tablet Take 1 tablet (81 mg total) by mouth daily.      . Boron 3 MG CAPS Take 3 mg by mouth every evening.       . Cholecalciferol (VITAMIN D3) 5000 UNITS CAPS Take 5,000 Units by mouth every evening.      Marland Kitchen CINNAMON PO Take 250 mg by mouth 2 (two) times daily.       . clopidogrel (PLAVIX) 75 MG tablet Take 1 tablet (75 mg total) by mouth daily with breakfast.  30 tablet  11  . Cyanocobalamin (VITAMIN B-12) 2500 MCG SUBL Place 2,500 mcg under the tongue daily.      . diclofenac sodium (VOLTAREN) 1 % GEL Apply 1 application topically daily as needed (pain).      .  hydrochlorothiazide (MICROZIDE) 12.5 MG capsule Take 12.5 mg by mouth daily.      Marland Kitchen HYDROcodone-acetaminophen (NORCO) 10-325 MG per tablet Take 1 tablet by mouth every 6 (six) hours as needed for moderate pain or severe pain.  120 tablet  0  . hydrocortisone (ANUSOL-HC) 25 MG suppository Place 25 mg rectally at bedtime.      . hyoscyamine (LEVSIN SL) 0.125 MG SL tablet Place 1 tablet (0.125 mg total) under the tongue every 4 (four) hours as needed  for cramping.  90 tablet  3  . Krill Oil 300 MG CAPS Take 300 mg by mouth every evening.       . meclizine (ANTIVERT) 12.5 MG tablet take 1 tablet by mouth three times a day if needed  90 tablet  0  . metoprolol succinate (TOPROL-XL) 25 MG 24 hr tablet Take 1 tablet (25 mg total) by mouth daily.  30 tablet  11  . Multiple Vitamins-Minerals (PRESERVISION AREDS 2 PO) Take 2 tablets by mouth daily.      . nabumetone (RELAFEN) 750 MG tablet Take 750 mg by mouth 2 (two) times daily.      Marland Kitchen omeprazole (PRILOSEC) 20 MG capsule Take 20 mg by mouth 2 (two) times daily.      Marland Kitchen OVER THE COUNTER MEDICATION Take 1-2 capsules by mouth 2 (two) times daily. Glucose Essentials - 1 capsule every morning, 2 capsules every night      . OVER THE COUNTER MEDICATION Take 400 mg by mouth 2 (two) times daily. tumeric      . OVER THE COUNTER MEDICATION Take 1 capsule by mouth 2 (two) times daily. Integrative Digestive Formula      . Polyvinyl Alcohol-Povidone (REFRESH OP) Place 1 drop into both eyes daily as needed (dry eyes).      . Probiotic Product (PROBIOTIC DAILY PO) Take 1 capsule by mouth daily.      Marland Kitchen trimethoprim (TRIMPEX) 100 MG tablet Take 100 mg by mouth at bedtime.       No current facility-administered medications for this visit.    Allergies:   Cymbalta; Gabapentin; Adhesive; Cefazolin; Ciprofloxacin; Clorazepate dipotassium; Darifenacin hydrobromide; Dilaudid; Enablex; Levofloxacin; Lyrica; Methadone hcl; Morphine and related; Pentazocine lactate; Talwin; and  Doxycycline   Social History:  The patient  reports that she has never smoked. She has never used smokeless tobacco. She reports that she does not drink alcohol or use illicit drugs.   Family History:  The patient's family history includes Breast cancer in an other family member; Diabetic kidney disease in her daughter; Heart disease in her mother; Hypertension in an other family member; Migraines in an other family member; Rheum arthritis in her mother. There is no history of Colon cancer.   ROS:  Please see the history of present illness.   All other systems reviewed and negative.   PHYSICAL EXAM: VS:  BP 118/70  Pulse 74  Ht 5\' 4"  (1.626 m)  Wt 190 lb (86.183 kg)  BMI 32.60 kg/m2 Well nourished, well developed, in no acute distress HEENT: normal Neck: no JVD at 90 Cardiac:  normal S1, S2; RRR; no murmur Lungs:  clear to auscultation bilaterally, no wheezing, rhonchi or rales Abd: soft, nontender, no hepatomegaly Ext: trace bilateral LE edema right wrist without hematoma or mass  Skin: warm and dry Neuro:  CNs 2-12 intact, no focal abnormalities noted  EKG:  NSR, HR 74, normal axis, nonspecific ST-T wave changes ASSESSMENT AND PLAN:  1. Chest Pain:  This is atypical. She has had this symptom for years without significant change. I do not believe that this is cardiac. No further workup is recommended. 2. CAD:  Stable since angioplasty. She is greater than 30 days out. I reviewed her case with Dr. Casandra Doffing.  She may hold Plavix for her upcoming EGD/colonoscopy and epidural steroid injection. She required no further cardiac workup prior to these procedures and is at acceptable risk. She will need to remain on aspirin throughout. We can consider resuming Plavix  in the future after her procedures. I have asked her to remain off of Plavix until she is seen back in follow up. She will continue aspirin and beta blocker. She is intolerant to statins. 3. Hypertension:   Controlled. 4. Disposition:  F/u with Dr. Ena Dawley in 2 mos.    Signed, Richardson Dopp, PA-C  01/08/2014 11:30 AM

## 2014-01-08 NOTE — Patient Instructions (Signed)
OK TO HOLD PLAVIX PER SCOTT WEAVER, PAC UNTIL YOUR FOLLOW UP WITH DR. Meda Coffee IN 2 MONTHS  MAKE SURE TO STAY ON ASPIRIN  YOU HAVE A FOLLOW UP WITH DR. Meda Coffee ON 03/05/14 @ 11:45 AM

## 2014-01-12 ENCOUNTER — Other Ambulatory Visit: Payer: Self-pay | Admitting: Internal Medicine

## 2014-01-13 ENCOUNTER — Ambulatory Visit: Payer: Medicare Other | Admitting: Cardiology

## 2014-01-16 ENCOUNTER — Ambulatory Visit (INDEPENDENT_AMBULATORY_CARE_PROVIDER_SITE_OTHER): Payer: Medicare Other | Admitting: Internal Medicine

## 2014-01-16 ENCOUNTER — Encounter: Payer: Self-pay | Admitting: Internal Medicine

## 2014-01-16 ENCOUNTER — Telehealth: Payer: Self-pay | Admitting: Internal Medicine

## 2014-01-16 VITALS — BP 118/71 | HR 84 | Temp 98.3°F | Wt 198.0 lb

## 2014-01-16 DIAGNOSIS — D649 Anemia, unspecified: Secondary | ICD-10-CM

## 2014-01-16 DIAGNOSIS — I10 Essential (primary) hypertension: Secondary | ICD-10-CM | POA: Diagnosis not present

## 2014-01-16 DIAGNOSIS — I2510 Atherosclerotic heart disease of native coronary artery without angina pectoris: Secondary | ICD-10-CM | POA: Diagnosis not present

## 2014-01-16 DIAGNOSIS — K5900 Constipation, unspecified: Secondary | ICD-10-CM

## 2014-01-16 DIAGNOSIS — E119 Type 2 diabetes mellitus without complications: Secondary | ICD-10-CM | POA: Diagnosis not present

## 2014-01-16 DIAGNOSIS — G9050 Complex regional pain syndrome I, unspecified: Secondary | ICD-10-CM

## 2014-01-16 DIAGNOSIS — R1319 Other dysphagia: Secondary | ICD-10-CM

## 2014-01-16 DIAGNOSIS — J189 Pneumonia, unspecified organism: Secondary | ICD-10-CM

## 2014-01-16 LAB — CBC WITH DIFFERENTIAL/PLATELET
Basophils Absolute: 0 10*3/uL (ref 0.0–0.1)
Basophils Relative: 0.4 % (ref 0.0–3.0)
Eosinophils Absolute: 0.2 10*3/uL (ref 0.0–0.7)
Eosinophils Relative: 2.6 % (ref 0.0–5.0)
HCT: 38 % (ref 36.0–46.0)
Hemoglobin: 12.3 g/dL (ref 12.0–15.0)
Lymphocytes Relative: 33.6 % (ref 12.0–46.0)
Lymphs Abs: 2.1 10*3/uL (ref 0.7–4.0)
MCHC: 32.5 g/dL (ref 30.0–36.0)
MCV: 87.7 fl (ref 78.0–100.0)
Monocytes Absolute: 0.4 10*3/uL (ref 0.1–1.0)
Monocytes Relative: 6.9 % (ref 3.0–12.0)
Neutro Abs: 3.6 10*3/uL (ref 1.4–7.7)
Neutrophils Relative %: 56.5 % (ref 43.0–77.0)
Platelets: 210 10*3/uL (ref 150.0–400.0)
RBC: 4.33 Mil/uL (ref 3.87–5.11)
RDW: 16.2 % — ABNORMAL HIGH (ref 11.5–14.6)
WBC: 6.3 10*3/uL (ref 4.5–10.5)

## 2014-01-16 LAB — HEMOGLOBIN A1C: Hgb A1c MFr Bld: 6.3 % (ref 4.6–6.5)

## 2014-01-16 MED ORDER — HYDROCODONE-ACETAMINOPHEN 10-325 MG PO TABS: 1.0000 | ORAL_TABLET | Freq: Four times a day (QID) | ORAL | Status: DC | PRN

## 2014-01-16 NOTE — Progress Notes (Signed)
Pre visit review using our clinic review tool, if applicable. No additional management support is needed unless otherwise documented below in the visit note. 

## 2014-01-16 NOTE — Progress Notes (Signed)
Subjective:    Patient ID: [name], female    DOB: [birth year], 79 y.o.   MRN: [id number]  DOS:  01/16/2014 Type of  visit:   ROV Diabetes-- due  for labs History of anemia, EGD and colonoscopy pending CAD--chart reviewed, had a stress test, cardiac catheterization back in January 2015, had a stent. Stable at this time. Pain management, sees orthopedic surgery, Dr. Lynann Bologna, Dr. Sharol Given, I'm in charge of prescribing hydrocodone  ROS Denies chest pain, difficulty breathing at this time. Denies nausea, vomiting, diarrhea. Continue or extremity pain, already discussed with Dr. Lynann Bologna, coming from her spine?   Past Medical History  Diagnosis Date  . Hyperlipidemia     a. patient unwilling to use statins.  . Osteoporosis   . DVT (deep venous thrombosis) 12/2007  . PE (pulmonary embolism) 12/2007    a. PE/DVT after neck surgery 2009. b. coumadin d/c 10-2008.  . Pulmonary nodule     incidental per CT:  Pet scan 4-9: likely benign, CT 08-2009 no change, no further CTs (Dr. Gwenette Greet)  . Hemorrhoids   . IBS (irritable bowel syndrome)   . Diverticulosis   . PPD positive   . Macular degeneration 03/2009    Dr. Rosana Hoes  . Allergic rhinitis   . Dyspnea     a. Chronic, extensive w/u see OV note 09-2010. b. [place] 2011: ormal with RA 5 RV 31/2 PA 27/12 (19) PCW 10 CO normal. No evidence of shunting with sitting up in cath lab. c. CPX 2011: see report. d. Prior fluoro of diaphragm -  R diaphragm elevated at rest but both moved with inspiration.   Marland Kitchen Spinal stenosis   . GERD (gastroesophageal reflux disease)     a. Hx GERD/esophageal dysmotility followed by Dr. Olevia Perches.   . Syncope     seen Dr. Haroldine Laws  . Hypertension   . Neuropathy     a. Hands, feet, legs.  . Orthostatic hypotension   . CAD (coronary artery disease)     a. Nonobst in 2011. b. Abnormal nuc 09/2013 -> s/p cutting balloon to D2; mild LAD disease 10/31/13.  . Osteomyelitis     a. Adm 04/2013: Charcot collapse of the right foot with  osteomyelitis and ulceration, s/p excision.  . Venous insufficiency     a. Contributing to LEE.  . Type II diabetes mellitus   . DJD (degenerative joint disease)   . Arthritis     "back; ankles; hands; knees" (10/31/2013)  . Recurrent UTI   . 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 1994 - malignant polyp removed during colonoscopy.  . Hiatal hernia     a. s/p Nissen fundoplication 3500.  Marland Kitchen RSD (reflex sympathetic dystrophy)     a. Chronic pain.  . Abnormal echocardiogram     09/2013: mild LVH, mild focal basal hypertrophy of septum, EF 60-65%, normal WM, grade 1 diastolic dysfunction, MAC, mild LAE, ASA, PASP 34. Possible oscillating MV density - reviewed by MD and felt there was no significant abnormality other than MAC noted with mitral valve and did not require TEE.    Past Surgical History  Procedure Laterality Date  . Nissen fundoplication  06/25/8181  . Cholecystectomy    . Cataract extraction w/ intraocular lens  implant, bilateral Bilateral ~ 2003  . Rotator cuff repair  07/2007    Dr. Percell Miller  . Anterior cervical decomp/discectomy fusion  12/18/07    For OA,  Dr. Lorin Mercy:  fu by a  PE  . Toe amputation Right 08/2008    3rd toe, Dr. Sharol Given due to osteomyelitis  . Nasal septum surgery    . Vein ligation Bilateral 1967  . Carpal tunnel release Left 09/2011  . Amputation  03/06/2012    Procedure: AMPUTATION FOOT;  Surgeon: Newt Minion, MD;  Location: [facility];  Service: Orthopedics;  Laterality: Left;  FIFTH RAY AMPUTATION   . Vena cava filter placement  01/2010    green filter; "due to blood clots"  . Ankle fusion  09/27/2012    Procedure: ANKLE FUSION;  Surgeon: Newt Minion, MD;  Location: [facility];  Service: Orthopedics;  Laterality: Left;  Left Tibiocalcaneal Fusion  . Ankle fusion Right 05/09/2013    Procedure: ANKLE FUSION;  Surgeon: Newt Minion, MD;  Location: [facility];  Service: Orthopedics;  Laterality: Right;  Excision Osteomyelitis Base 1st MT Right Foot, Fusion Medial Column  .  Cardiac catheterization  05/2010     at [facility]  . Coronary angioplasty  10/31/2013  . Vaginal hysterectomy  ~ 1976    History   Social History  . Marital Status: Divorced    Spouse Name: N/A    Number of Children: 3  . Years of Education: N/A   Occupational History  . Retired      from [employer]    Social History Main Topics  . Smoking status: Never Smoker   . Smokeless tobacco: Never Used  . Alcohol Use: No  . Drug Use: No  . Sexual Activity: No   Other Topics Concern  . Not on file   Social History Narrative   1 daughter lives w/ her , another daughter lives in town, 1 son in [place]                  Medication List       This list is accurate as of: 01/16/14 11:59 PM.  Always use your most recent med list.               acetaminophen 500 MG tablet  Commonly known as:  TYLENOL  Take 500 mg by mouth daily as needed for mild pain or moderate pain.     Alpha-Lipoic Acid 600 MG Caps  Take 600 mg by mouth 2 (two) times daily.     amLODipine-benazepril 5-10 MG per capsule  Commonly known as:  LOTREL  take 1 capsule by mouth once daily     aspirin EC 81 MG tablet  Take 1 tablet (81 mg total) by mouth daily.     Boron 3 MG Caps  Take 3 mg by mouth every evening.     CINNAMON PO  Take 250 mg by mouth 2 (two) times daily.     diclofenac sodium 1 % Gel  Commonly known as:  VOLTAREN  Apply 1 application topically daily as needed (pain).     hydrochlorothiazide 12.5 MG capsule  Commonly known as:  MICROZIDE  Take 12.5 mg by mouth daily.     HYDROcodone-acetaminophen 10-325 MG per tablet  Commonly known as:  NORCO  Take 1 tablet by mouth every 6 (six) hours as needed for moderate pain or severe pain.     hydrocortisone 25 MG suppository  Commonly known as:  ANUSOL-HC  Place 25 mg rectally at bedtime.     hyoscyamine 0.125 MG SL tablet  Commonly known as:  LEVSIN SL  Place 1 tablet (0.125 mg total) under the tongue every 4 (four)  hours as needed  for cramping.     Krill Oil 300 MG Caps  Take 300 mg by mouth every evening.     meclizine 12.5 MG tablet  Commonly known as:  ANTIVERT  take 1 tablet by mouth three times a day if needed     metoprolol succinate 25 MG 24 hr tablet  Commonly known as:  TOPROL-XL  Take 1 tablet (25 mg total) by mouth daily.     nabumetone 750 MG tablet  Commonly known as:  RELAFEN  Take 750 mg by mouth 2 (two) times daily.     omeprazole 20 MG capsule  Commonly known as:  PRILOSEC  Take 20 mg by mouth 2 (two) times daily.     OVER THE COUNTER MEDICATION  Take 1-2 capsules by mouth 2 (two) times daily. Glucose Essentials - 1 capsule every morning, 2 capsules every night     OVER THE COUNTER MEDICATION  Take 400 mg by mouth 2 (two) times daily. tumeric     OVER THE COUNTER MEDICATION  Take 1 capsule by mouth 2 (two) times daily. Integrative Digestive Formula     PRESERVISION AREDS 2 PO  Take 2 tablets by mouth daily.     PROBIOTIC DAILY PO  Take 1 capsule by mouth daily.     REFRESH OP  Place 1 drop into both eyes daily as needed (dry eyes).     trimethoprim 100 MG tablet  Commonly known as:  TRIMPEX  Take 100 mg by mouth at bedtime.     Vitamin B-12 2500 MCG Subl  Place 2,500 mcg under the tongue daily.     Vitamin D3 5000 UNITS Caps  Take 5,000 Units by mouth every evening.           Objective:   Physical Exam BP 118/71  Pulse 84  Temp(Src) 98.3 F (36.8 C)  Wt 198 lb (89.812 kg)  SpO2 100% General -- alert, well-developed, NAD.   Lungs -- normal respiratory effort, no intercostal retractions, no accessory muscle use, and normal breath sounds.  Heart-- normal rate, regular rhythm, no murmur.  Extremities-- no pitting edema bilaterally  Neurologic--  alert & oriented X3. Speech normal, gait normal, strength normal in all extremities. Psych-- Cognition and judgment appear intact. Cooperative with normal attention span and concentration. No anxious or depressed appearing.         Assessment & Plan:

## 2014-01-16 NOTE — Patient Instructions (Signed)
Get your blood work before you leave     Next visit is for routine check up in 4 months  No need to come back fasting Please make an appointment    

## 2014-01-16 NOTE — Telephone Encounter (Signed)
Patient is off Plavix x 1 week. Patient's daughter is asking if she can schedule her EGD and colonoscopy now. She was previously scheduled at hospital. Please, advise.

## 2014-01-17 ENCOUNTER — Telehealth: Payer: Self-pay | Admitting: Internal Medicine

## 2014-01-17 NOTE — Telephone Encounter (Signed)
Relevant patient education assigned to patient using Emmi. ° °

## 2014-01-18 ENCOUNTER — Encounter: Payer: Self-pay | Admitting: Internal Medicine

## 2014-01-18 DIAGNOSIS — D649 Anemia, unspecified: Secondary | ICD-10-CM | POA: Insufficient documentation

## 2014-01-18 NOTE — Assessment & Plan Note (Signed)
CAD, chart reviewed, since the last time she was here she had a stent, currently not taking Plavix, see last cardiology note.  12-2013 CV OV note: Patient was evaluated by Dr. Rayann Heman in January for chest pain and shortness of breath. Myoview was abnormal and cardiac catheterization was recommended. Patient was admitted 1/9-1/10. Cardiac catheterization demonstrated high-grade disease in the second diagonal which was treated with cutting balloon angioplasty. Patient was to have GI evaluation in the near future (EGD and colonoscopy). She had a recent history of dysphagia as well as rectal bleeding with prior history of adenomatous polyps and in situ carcinoma. Therefore, long term commitment to dual antiplatelet Rx was to be avoided.

## 2014-01-18 NOTE — Assessment & Plan Note (Signed)
Diabetes, due for a A1c. 

## 2014-01-18 NOTE — Telephone Encounter (Signed)
Yes, it is OK now to schedule EGD/colon since she is off Plavix. LEC is OK,I don't see why she would need to be done in the hospital.

## 2014-01-18 NOTE — Assessment & Plan Note (Addendum)
RF Hydrocodone Last UDS low risk

## 2014-01-18 NOTE — Assessment & Plan Note (Signed)
  History of anemia, GI review of systems negative, she takes her Relafen with food. Has had EGD and colonoscopy pending.

## 2014-01-19 ENCOUNTER — Encounter: Payer: Self-pay | Admitting: *Deleted

## 2014-01-19 ENCOUNTER — Ambulatory Visit (AMBULATORY_SURGERY_CENTER): Payer: Self-pay | Admitting: *Deleted

## 2014-01-19 VITALS — Ht 64.0 in | Wt 198.8 lb

## 2014-01-19 DIAGNOSIS — Z85038 Personal history of other malignant neoplasm of large intestine: Secondary | ICD-10-CM

## 2014-01-19 DIAGNOSIS — R1319 Other dysphagia: Secondary | ICD-10-CM

## 2014-01-19 NOTE — Telephone Encounter (Signed)
Spoke with patient's daughter and scheduled ECOL on 01/28/14 at 2:30 PM and pre visit on 01/19/14 4:30 PM.

## 2014-01-19 NOTE — Progress Notes (Signed)
No allergies to eggs or soy. No problems with anesthesia.  

## 2014-01-20 DIAGNOSIS — M47817 Spondylosis without myelopathy or radiculopathy, lumbosacral region: Secondary | ICD-10-CM | POA: Diagnosis not present

## 2014-01-20 DIAGNOSIS — M545 Low back pain, unspecified: Secondary | ICD-10-CM | POA: Diagnosis not present

## 2014-01-23 NOTE — Telephone Encounter (Signed)
Pt spoke to [place] in January and appt should of been cx'd at that time/yf

## 2014-01-28 ENCOUNTER — Encounter: Payer: Self-pay | Admitting: Internal Medicine

## 2014-01-28 ENCOUNTER — Encounter (HOSPITAL_COMMUNITY): Payer: Self-pay | Admitting: Emergency Medicine

## 2014-01-28 ENCOUNTER — Ambulatory Visit (AMBULATORY_SURGERY_CENTER): Payer: Medicare Other | Admitting: Internal Medicine

## 2014-01-28 ENCOUNTER — Observation Stay (HOSPITAL_COMMUNITY)
Admission: EM | Admit: 2014-01-28 | Discharge: 2014-01-31 | Disposition: A | Discharge status: Home / Self Care | Payer: Medicare Other | Attending: Internal Medicine | Admitting: Internal Medicine

## 2014-01-28 ENCOUNTER — Emergency Department (HOSPITAL_COMMUNITY): Payer: Medicare Other

## 2014-01-28 VITALS — BP 143/69 | HR 82 | Temp 98.5°F | Resp 19 | Ht 64.0 in | Wt 198.0 lb

## 2014-01-28 DIAGNOSIS — Z86718 Personal history of other venous thrombosis and embolism: Secondary | ICD-10-CM | POA: Diagnosis not present

## 2014-01-28 DIAGNOSIS — Z9089 Acquired absence of other organs: Secondary | ICD-10-CM | POA: Insufficient documentation

## 2014-01-28 DIAGNOSIS — K449 Diaphragmatic hernia without obstruction or gangrene: Secondary | ICD-10-CM | POA: Diagnosis not present

## 2014-01-28 DIAGNOSIS — R109 Unspecified abdominal pain: Secondary | ICD-10-CM | POA: Diagnosis not present

## 2014-01-28 DIAGNOSIS — Z86711 Personal history of pulmonary embolism: Secondary | ICD-10-CM | POA: Diagnosis not present

## 2014-01-28 DIAGNOSIS — K2270 Barrett's esophagus without dysplasia: Secondary | ICD-10-CM

## 2014-01-28 DIAGNOSIS — Z7982 Long term (current) use of aspirin: Secondary | ICD-10-CM | POA: Insufficient documentation

## 2014-01-28 DIAGNOSIS — E1142 Type 2 diabetes mellitus with diabetic polyneuropathy: Secondary | ICD-10-CM | POA: Diagnosis present

## 2014-01-28 DIAGNOSIS — M810 Age-related osteoporosis without current pathological fracture: Secondary | ICD-10-CM | POA: Insufficient documentation

## 2014-01-28 DIAGNOSIS — R1319 Other dysphagia: Secondary | ICD-10-CM

## 2014-01-28 DIAGNOSIS — G9050 Complex regional pain syndrome I, unspecified: Secondary | ICD-10-CM | POA: Insufficient documentation

## 2014-01-28 DIAGNOSIS — I10 Essential (primary) hypertension: Secondary | ICD-10-CM | POA: Diagnosis not present

## 2014-01-28 DIAGNOSIS — Z9889 Other specified postprocedural states: Secondary | ICD-10-CM | POA: Insufficient documentation

## 2014-01-28 DIAGNOSIS — E119 Type 2 diabetes mellitus without complications: Secondary | ICD-10-CM | POA: Diagnosis not present

## 2014-01-28 DIAGNOSIS — K5730 Diverticulosis of large intestine without perforation or abscess without bleeding: Secondary | ICD-10-CM

## 2014-01-28 DIAGNOSIS — K219 Gastro-esophageal reflux disease without esophagitis: Secondary | ICD-10-CM | POA: Diagnosis not present

## 2014-01-28 DIAGNOSIS — K5289 Other specified noninfective gastroenteritis and colitis: Secondary | ICD-10-CM

## 2014-01-28 DIAGNOSIS — Z85038 Personal history of other malignant neoplasm of large intestine: Secondary | ICD-10-CM | POA: Insufficient documentation

## 2014-01-28 DIAGNOSIS — G8929 Other chronic pain: Secondary | ICD-10-CM | POA: Diagnosis not present

## 2014-01-28 DIAGNOSIS — Z90710 Acquired absence of both cervix and uterus: Secondary | ICD-10-CM | POA: Insufficient documentation

## 2014-01-28 DIAGNOSIS — D126 Benign neoplasm of colon, unspecified: Secondary | ICD-10-CM

## 2014-01-28 DIAGNOSIS — R1310 Dysphagia, unspecified: Secondary | ICD-10-CM | POA: Diagnosis not present

## 2014-01-28 DIAGNOSIS — R197 Diarrhea, unspecified: Secondary | ICD-10-CM | POA: Diagnosis present

## 2014-01-28 DIAGNOSIS — H3530 Unspecified macular degeneration: Secondary | ICD-10-CM | POA: Insufficient documentation

## 2014-01-28 DIAGNOSIS — E785 Hyperlipidemia, unspecified: Secondary | ICD-10-CM | POA: Diagnosis not present

## 2014-01-28 DIAGNOSIS — K529 Noninfective gastroenteritis and colitis, unspecified: Secondary | ICD-10-CM | POA: Diagnosis present

## 2014-01-28 DIAGNOSIS — S98139A Complete traumatic amputation of one unspecified lesser toe, initial encounter: Secondary | ICD-10-CM | POA: Diagnosis not present

## 2014-01-28 DIAGNOSIS — R110 Nausea: Secondary | ICD-10-CM

## 2014-01-28 DIAGNOSIS — Z79899 Other long term (current) drug therapy: Secondary | ICD-10-CM | POA: Insufficient documentation

## 2014-01-28 DIAGNOSIS — S98919A Complete traumatic amputation of unspecified foot, level unspecified, initial encounter: Secondary | ICD-10-CM | POA: Insufficient documentation

## 2014-01-28 DIAGNOSIS — Z1211 Encounter for screening for malignant neoplasm of colon: Secondary | ICD-10-CM | POA: Diagnosis not present

## 2014-01-28 DIAGNOSIS — I2510 Atherosclerotic heart disease of native coronary artery without angina pectoris: Secondary | ICD-10-CM | POA: Diagnosis not present

## 2014-01-28 DIAGNOSIS — G609 Hereditary and idiopathic neuropathy, unspecified: Secondary | ICD-10-CM | POA: Diagnosis not present

## 2014-01-28 DIAGNOSIS — N281 Cyst of kidney, acquired: Secondary | ICD-10-CM | POA: Diagnosis not present

## 2014-01-28 DIAGNOSIS — D099 Carcinoma in situ, unspecified: Secondary | ICD-10-CM

## 2014-01-28 LAB — I-STAT CHEM 8, ED
BUN: 11 mg/dL (ref 6–23)
Calcium, Ion: 1.13 mmol/L (ref 1.13–1.30)
Chloride: 104 mEq/L (ref 96–112)
Creatinine, Ser: 0.7 mg/dL (ref 0.50–1.10)
Glucose, Bld: 169 mg/dL — ABNORMAL HIGH (ref 70–99)
HCT: 42 % (ref 36.0–46.0)
Hemoglobin: 14.3 g/dL (ref 12.0–15.0)
Potassium: 4 mEq/L (ref 3.7–5.3)
Sodium: 139 mEq/L (ref 137–147)
TCO2: 22 mmol/L (ref 0–100)

## 2014-01-28 LAB — HEPATIC FUNCTION PANEL
ALT: 7 U/L (ref 0–35)
AST: 13 U/L (ref 0–37)
Albumin: 3.9 g/dL (ref 3.5–5.2)
Alkaline Phosphatase: 74 U/L (ref 39–117)
Bilirubin, Direct: <0.2 mg/dL (ref 0.0–0.3)
Total Bilirubin: 0.4 mg/dL (ref 0.3–1.2)
Total Protein: 7.4 g/dL (ref 6.0–8.3)

## 2014-01-28 LAB — CBC WITH DIFFERENTIAL/PLATELET
Basophils Absolute: 0 10*3/uL (ref 0.0–0.1)
Basophils Relative: 0 % (ref 0–1)
Eosinophils Absolute: 0.1 10*3/uL (ref 0.0–0.7)
Eosinophils Relative: 1 % (ref 0–5)
HCT: 39 % (ref 36.0–46.0)
Hemoglobin: 12.6 g/dL (ref 12.0–15.0)
Lymphocytes Relative: 20 % (ref 12–46)
Lymphs Abs: 1.2 10*3/uL (ref 0.7–4.0)
MCH: 28.2 pg (ref 26.0–34.0)
MCHC: 32.3 g/dL (ref 30.0–36.0)
MCV: 87.2 fL (ref 78.0–100.0)
Monocytes Absolute: 0.2 10*3/uL (ref 0.1–1.0)
Monocytes Relative: 3 % (ref 3–12)
Neutro Abs: 4.6 10*3/uL (ref 1.7–7.7)
Neutrophils Relative %: 76 % (ref 43–77)
Platelets: 193 10*3/uL (ref 150–400)
RBC: 4.47 MIL/uL (ref 3.87–5.11)
RDW: 14.4 % (ref 11.5–15.5)
WBC: 6.1 10*3/uL (ref 4.0–10.5)

## 2014-01-28 LAB — AMYLASE: Amylase: 21 U/L (ref 0–105)

## 2014-01-28 LAB — PROTIME-INR
INR: 1.04 (ref 0.00–1.49)
Prothrombin Time: 13.4 seconds (ref 11.6–15.2)

## 2014-01-28 LAB — LIPASE, BLOOD: Lipase: 19 U/L (ref 11–59)

## 2014-01-28 LAB — APTT: aPTT: 28 seconds (ref 24–37)

## 2014-01-28 MED ORDER — ACETAMINOPHEN 500 MG PO TABS: 500.0000 mg | ORAL_TABLET | Freq: Every day | ORAL | Status: DC | PRN

## 2014-01-28 MED ORDER — ONDANSETRON HCL 4 MG PO TABS: 4.0000 mg | ORAL_TABLET | Freq: Four times a day (QID) | ORAL | Status: DC | PRN

## 2014-01-28 MED ORDER — IOHEXOL 300 MG/ML  SOLN
100.0000 mL | Freq: Once | INTRAMUSCULAR | Status: AC | PRN
  Administered 2014-01-28: 100 mL via INTRAVENOUS

## 2014-01-28 MED ORDER — ACETAMINOPHEN 650 MG RE SUPP: 650.0000 mg | Freq: Four times a day (QID) | RECTAL | Status: DC | PRN

## 2014-01-28 MED ORDER — FENTANYL CITRATE 0.05 MG/ML IJ SOLN
12.5000 ug | INTRAMUSCULAR | Status: DC | PRN
  Administered 2014-01-28 – 2014-01-30 (×3): 25 ug via INTRAVENOUS
  Filled 2014-01-28 (×3): qty 2

## 2014-01-28 MED ORDER — HYOSCYAMINE SULFATE 0.125 MG SL SUBL
0.1250 mg | SUBLINGUAL_TABLET | SUBLINGUAL | Status: DC | PRN
  Filled 2014-01-28: qty 1

## 2014-01-28 MED ORDER — BENAZEPRIL HCL 10 MG PO TABS
10.0000 mg | ORAL_TABLET | Freq: Every day | ORAL | Status: DC
  Administered 2014-01-28 – 2014-01-31 (×4): 10 mg via ORAL
  Filled 2014-01-28 (×4): qty 1

## 2014-01-28 MED ORDER — PIPERACILLIN-TAZOBACTAM 3.375 G IVPB
3.3750 g | Freq: Three times a day (TID) | INTRAVENOUS | Status: DC
  Administered 2014-01-29: 3.375 g via INTRAVENOUS
  Filled 2014-01-28 (×3): qty 50

## 2014-01-28 MED ORDER — ACETAMINOPHEN 325 MG PO TABS: 650.0000 mg | ORAL_TABLET | Freq: Four times a day (QID) | ORAL | Status: DC | PRN

## 2014-01-28 MED ORDER — PIPERACILLIN-TAZOBACTAM 3.375 G IVPB
3.3750 g | Freq: Three times a day (TID) | INTRAVENOUS | Status: DC
  Filled 2014-01-28 (×2): qty 50

## 2014-01-28 MED ORDER — MECLIZINE HCL 12.5 MG PO TABS
12.5000 mg | ORAL_TABLET | Freq: Three times a day (TID) | ORAL | Status: DC | PRN
  Filled 2014-01-28: qty 1

## 2014-01-28 MED ORDER — AMLODIPINE BESY-BENAZEPRIL HCL 5-10 MG PO CAPS: 1.0000 | ORAL_CAPSULE | Freq: Every day | ORAL | Status: DC

## 2014-01-28 MED ORDER — SODIUM CHLORIDE 0.9 % IV SOLN
500.0000 mL | INTRAVENOUS | Status: DC
Start: 2014-01-28 — End: 2014-01-29

## 2014-01-28 MED ORDER — HYDROCORTISONE ACETATE 25 MG RE SUPP
25.0000 mg | Freq: Two times a day (BID) | RECTAL | Status: DC | PRN
  Filled 2014-01-28: qty 1

## 2014-01-28 MED ORDER — SODIUM CHLORIDE 0.9 % IV SOLN
INTRAVENOUS | Status: DC
  Administered 2014-01-28: 22:00:00 via INTRAVENOUS

## 2014-01-28 MED ORDER — PIPERACILLIN-TAZOBACTAM 3.375 G IVPB 30 MIN
3.3750 g | INTRAVENOUS | Status: AC
  Administered 2014-01-28: 3.375 g via INTRAVENOUS
  Filled 2014-01-28: qty 50

## 2014-01-28 MED ORDER — PANTOPRAZOLE SODIUM 40 MG IV SOLR
40.0000 mg | Freq: Two times a day (BID) | INTRAVENOUS | Status: DC
  Administered 2014-01-29 (×2): 40 mg via INTRAVENOUS
  Filled 2014-01-28 (×3): qty 40

## 2014-01-28 MED ORDER — FENTANYL CITRATE 0.05 MG/ML IJ SOLN
100.0000 ug | Freq: Once | INTRAMUSCULAR | Status: AC
  Administered 2014-01-28: 100 ug via INTRAVENOUS
  Filled 2014-01-28: qty 2

## 2014-01-28 MED ORDER — AMLODIPINE BESYLATE 5 MG PO TABS
5.0000 mg | ORAL_TABLET | Freq: Every day | ORAL | Status: DC
  Administered 2014-01-28 – 2014-01-31 (×4): 5 mg via ORAL
  Filled 2014-01-28 (×4): qty 1

## 2014-01-28 MED ORDER — NABUMETONE 750 MG PO TABS
750.0000 mg | ORAL_TABLET | Freq: Two times a day (BID) | ORAL | Status: DC
  Administered 2014-01-28 – 2014-01-31 (×6): 750 mg via ORAL
  Filled 2014-01-28 (×7): qty 1

## 2014-01-28 MED ORDER — HYDROCODONE-ACETAMINOPHEN 10-325 MG PO TABS
1.0000 | ORAL_TABLET | Freq: Four times a day (QID) | ORAL | Status: DC | PRN
  Administered 2014-01-28 – 2014-01-31 (×9): 1 via ORAL
  Filled 2014-01-28 (×6): qty 1
  Filled 2014-01-28: qty 2
  Filled 2014-01-28 (×2): qty 1

## 2014-01-28 MED ORDER — METOPROLOL SUCCINATE ER 25 MG PO TB24
25.0000 mg | ORAL_TABLET | Freq: Every day | ORAL | Status: DC
  Administered 2014-01-28 – 2014-01-30 (×3): 25 mg via ORAL
  Filled 2014-01-28 (×4): qty 1

## 2014-01-28 MED ORDER — SODIUM CHLORIDE 0.9 % IV BOLUS (SEPSIS)
500.0000 mL | Freq: Once | INTRAVENOUS | Status: AC
  Administered 2014-01-28: 500 mL via INTRAVENOUS

## 2014-01-28 MED ORDER — PIPERACILLIN-TAZOBACTAM 3.375 G IVPB: 3.3750 g | Freq: Once | INTRAVENOUS | Status: DC

## 2014-01-28 MED ORDER — FENTANYL CITRATE 0.05 MG/ML IJ SOLN
50.0000 ug | Freq: Once | INTRAMUSCULAR | Status: AC
  Administered 2014-01-28: 50 ug via INTRAVENOUS
  Filled 2014-01-28: qty 2

## 2014-01-28 MED ORDER — IOHEXOL 300 MG/ML  SOLN
50.0000 mL | Freq: Once | INTRAMUSCULAR | Status: AC | PRN
  Administered 2014-01-28: 50 mL via ORAL

## 2014-01-28 MED ORDER — ONDANSETRON HCL 4 MG/2ML IJ SOLN
4.0000 mg | Freq: Four times a day (QID) | INTRAMUSCULAR | Status: DC | PRN
  Administered 2014-01-28: 4 mg via INTRAVENOUS
  Filled 2014-01-28: qty 2

## 2014-01-28 NOTE — Progress Notes (Signed)
Called to room to assist during endoscopic procedure.  Patient ID and intended procedure confirmed with present staff. Received instructions for my participation in the procedure from the performing physician.  

## 2014-01-28 NOTE — Progress Notes (Signed)
A/ox3 pleased with MAC, report to April RN 

## 2014-01-28 NOTE — Progress Notes (Signed)
Brief note:  Pt underwent surveillance colonoscopy this afternoon for follow up of adenomatous polyps. Please see my colonoscopy note with Photo-documentation. Several  polyps were found in the cecal pouch and ascending colon which were removed partially with hot snare, Epinephrine injection for hemostasis and 3 endo clips. Pt c/o abdominal pain after the procedure. Decision was made to obtain CT scan of the abdomen and pelvis to r/o perforation or postpolypectomy burn. As of now, pt is feeling better, but still very tender in RLQ. She is awaiting CT scan. I have spoken to Dr Alvino Chapel and Dr Henrene Pastor who is on call for GI. Plan is to review CT scan, possibly send home or admit for observation  To hospitalist service for antibiotic coverage.Marland Kitchenand pain control.

## 2014-01-28 NOTE — ED Notes (Signed)
Patient informed of delay in bedplacement--v/u Patient medicated for pain and nausea, see MAR VS updated and stable Patient denies further needs at this time Side rails up, call bell in reach  Awaiting bed placement

## 2014-01-28 NOTE — Op Note (Signed)
[facility]  [address] [place], [address]   ENDOSCOPY PROCEDURE REPORT  PATIENT: Mary, Gould  MR#: [id number] BIRTHDATE: December 18, 1934 , 78  yrs. old GENDER: Female ENDOSCOPIST: [place] Dragon, MD REFERRED BY:  [name], M.D. PROCEDURE DATE:  01/28/2014 PROCEDURE:  EGD w/ biopsy and Maloney dilation of esophagus ASA CLASS:     Class III INDICATIONS:  Dysphagia.   history of Nissen fundoplication in 9735. Recurrent dysphagia.  Last upper endoscopy 2012 showed retained food in the stomach.  History of esophageal dysmotility on barium esophagram.. MEDICATIONS: MAC sedation, administered by CRNA and propofol (Diprivan) 150mg  IV TOPICAL ANESTHETIC: none  DESCRIPTION OF PROCEDURE: After the risks benefits and alternatives of the procedure were thoroughly explained, informed consent was obtained.  The LB [id number] endoscope was introduced through the mouth and advanced to the second portion of the duodenum. Without limitations.  The instrument was slowly withdrawn as the mucosa was fully examined.      Esophagus: Proximate mid-and distal esophageal mucosa appeared normal. There was a Nissen fundoplication at the level of the GE junction which was closed. We had to exert pressure to enter into the stomach. Biopsies were taken from the Z line to rule out Barrett's esophagus Stomach: Gastric mucosa appeared normal there was no gastritis pyloric outlet was unremarkable. Retroflexion of the endoscope confirmed the presence of functioning [place] in fundoplication Duodenum duodenal bulb and descending duodenum was normal. Maloney dilators 82 French passed slowly through the Nissen fundoplication with small amount of fall blood on the dilator[          The scope was then withdrawn from the patient and the procedure completed.  COMPLICATIONS: There were no complications. ENDOSCOPIC IMPRESSION: status post functioning Nissen fundoplication Biopsies from the GE  junction to rule out Barrett's esophagus Passage O. 48 French Maloney dilator RECOMMENDATIONS: 1.  Anti-reflux regimen to be follow 2.  Await biopsy results 3.  Continue current meds  REPEAT EXAM: for Dilatation.  .  recall pending path report  eSigned:  L[place] Dragon, MD 01/28/2014 3:55 PM   CC:  PATIENT NAME:  Mary, Gould MR#: [id number]

## 2014-01-28 NOTE — Progress Notes (Addendum)
upon pt's arrival to recovery at 1542 after EGD/colonoscopy, pt was groaning and c/o abd pain, abd was soft non-distended, pt was passing gas, pt was tender on LRQ when palpating, pt rates pain 8 1/2 out of 10 and describes it as sharp constant, pt had 3 clips placed in cecum during colonoscopy due to polyps being removed , no abnormal finding in chest area, no subq air palpable and and pt denies pain in chest region, pt's daughter is with her and is concerned about her pain in recovery, Dr. Olevia Perches was advised of findings and advised she will come see pt as soon as she is finished with her report. Pt was on L side passing air but complained it was hard to pass gas so had pt roll to right side to see if we could get the air to move around, pt was placed in a slight trendelenburg position but head was maintained slightly elevated. Dr. Olevia Perches came in and assessted pt, ordered transfer to [facility] to have CT of abd and labs, transfer sheet was filled out, operator called, 911 called. As paper work was being filled out and waiting for paramedics pt states pain is better but still noticeable "maybe a 6", pt was burping and passed lg amounts of air from rectum while she was in recovery, as pt was loaded onto gurney pt seemed in better spirits (laughing with paramedics)  Pt was transferred at 1640 by ambulance to [facility] report given to paramedics, transfer sheet, demographic sheet, and copy of procedure reports were given to paramedics, all discharge information was given to pt's daughter and gone over with her. IV was left in place.   Copy of transfer sheet was placed on [employer] and in chart.

## 2014-01-28 NOTE — Op Note (Addendum)
[place]  [address] [place], [id number]   COLONOSCOPY PROCEDURE REPORT  PATIENT: Mary, Gould  MR#: [id number] BIRTHDATE: 1935/05/03 , 78  yrs. old GENDER: Female ENDOSCOPIST: Lafayette Dragon, MD REFERRED SN:KNLZ Larose Kells, M.D. PROCEDURE DATE:  01/28/2014 PROCEDURE:   Colonoscopy with snare polypectomy and Colonoscopy with cold biopsy polypectomy First Screening Colonoscopy - Avg.  risk and is 50 yrs.  old or older - No.  Prior Negative Screening - Now for repeat screening. N/A  History of Adenoma - Now for follow-up colonoscopy & has been > or = to 3 yrs.  Yes hx of adenoma.  Has been 3 or more years since last colonoscopy.  Polyps Removed Today? Yes. ASA CLASS:   Class III INDICATIONS:R.  carcinoma in situ in a polyp in 1994.  Adenomatous polyp in 1997, 2003 and 2008.  Three adenomatous polyps  in August 2010 MEDICATIONS: MAC sedation, administered by CRNA and propofol (Diprivan) 500mg  IV  DESCRIPTION OF PROCEDURE:   After the risks benefits and alternatives of the procedure were thoroughly explained, informed consent was obtained.  A digital rectal exam revealed no abnormalities of the rectum.   The LB [id number]-[id number]  endoscope was introduced through the anus and advanced to the cecum, which was identified by both the appendix and ileocecal valve. No adverse events experienced.   The quality of the prep was good, using MoviPrep  The instrument was then slowly withdrawn as the colon was fully examined.      COLON FINDINGS: Three sessile polyps were found in the ascending colon and at the cecum. the largest one in the cecal pouch measured about 2 cm in diameter and it was flat and multilobulated and it was removed with the cold snare piecemeal,. 3 endoclips were placed over the area of polypectomy after injecting 3 cc of epinephrine, second polyp was located in the ascending colon above the ileocecal valve and it measured  about  2 cm in diameter .  It was very difficult to remove  this polyp due to location  behind a fold. and only several pieces of it were removed., some polyp was left behind. due to  concern for perforation or bleeding .  The thord polyp was  located in the mid ascending colon, measured 8 mm and it was removed with cold biopsies, SPOT  tattoo was  placed .   Marland Kitchen There were multiple diverticula in the sigmoid colon  Retroflexed views revealed no abnormalities. The time to cecum=13 minutes 39 seconds.  Withdrawal time=26 minutes 260 seconds.  The scope was withdrawn and the procedure completed. COMPLICATIONS: There were no complications.  ENDOSCOPIC IMPRESSION: multiple colon polyps in the ascscending colon and in the cecum removed piecemeal wise with hot snare.  placement  of 3 endoclips in the cecum and injection of epinephrine in the cecal pouch. Partial removal of a asscending colon polyp located above the ileocecal valve. Placement of a tattoo at third polyp in the asscending colon. Polyp   was removed with the cold biopsies RECOMMENDATIONS:  await pathology A she will almost certainly need to laparoscopic resection of the polyps because of the size and the of sessile nature. And because she already has a history of in situ carcinoma in a sigmoid polyp in 1994 and is at high risk for colon cancer eSigned:  Lafayette Dragon, MD 06/11/2014 7:54 PM Revised: 06/11/2014 7:54 PM  cc:   PATIENT NAME:  Mary, Gould MR#: [id number]

## 2014-01-28 NOTE — H&P (Signed)
History and Physical       Hospital Admission Note Date: 01/28/2014  Patient name: [name] Medical record number: [id number] Date of birth: 21-Sep-1935 Age: 79 y.o. Gender: female  PCP: [name], MD    Chief Complaint:  Abdominal pain  HPI: Patient is a 79 year old female with history of hypertension, hyperlipidemia, GERD who was sent by Dr. Olevia Perches to the ER for abdominal pain after the colonoscopy today. History was obtained from the patient who reported that she had a routine colonoscopy and Dr. Nichola Sizer office today. She was found to have a cecal polyp which will likely need laparoscopic removal. However after the colonoscopy procedure, patient continued to have abdominal pain, diffuse . She continues denies any nausea or vomiting. CT scan of the abdomen and pelvis was done which showed a masslike wall thickening involving the cecal tip, subtle stranding of the edges and mesenteric fat, focal colitis from infectious or inflammatory cause versus neoplasm. Dr. Anna Genre recommended overnight observation, antibiotics.  Review of Systems:  Constitutional: Denies fever, chills, diaphoresis, poor appetite and fatigue.  HEENT: Denies photophobia, eye pain, redness, hearing loss, ear pain, congestion, sore throat, rhinorrhea, sneezing, mouth sores, trouble swallowing, neck pain, neck stiffness and tinnitus.   Respiratory: Denies SOB, DOE, cough, chest tightness,  and wheezing.   Cardiovascular: Denies chest pain, palpitations and leg swelling.  Gastrointestinal: See history of present illness  Genitourinary: Denies dysuria, urgency, frequency, hematuria, flank pain and difficulty urinating.  Musculoskeletal: Denies myalgias, back pain, joint swelling, arthralgias and gait problem.  Skin: Denies pallor, rash and wound.  Neurological: Denies dizziness, seizures, syncope, weakness, light-headedness, numbness and headaches.  Hematological:  Denies adenopathy. Easy bruising, personal or family bleeding history  Psychiatric/Behavioral: Denies suicidal ideation, mood changes, confusion, nervousness, sleep disturbance and agitation  Past Medical History: Past Medical History  Diagnosis Date  . Hyperlipidemia     a. patient unwilling to use statins.  . Osteoporosis   . DVT (deep venous thrombosis) 12/2007  . PE (pulmonary embolism) 12/2007    a. PE/DVT after neck surgery 2009. b. coumadin d/c 10-2008.  . Pulmonary nodule     incidental per CT:  Pet scan 4-9: likely benign, CT 08-2009 no change, no further CTs (Dr. Gwenette Greet)  . Hemorrhoids   . IBS (irritable bowel syndrome)   . Diverticulosis   . PPD positive   . Macular degeneration 03/2009    Dr. Rosana Hoes  . Allergic rhinitis   . Dyspnea     a. Chronic, extensive w/u see OV note 09-2010. b. [place] 2011: ormal with RA 5 RV 31/2 PA 27/12 (19) PCW 10 CO normal. No evidence of shunting with sitting up in cath lab. c. CPX 2011: see report. d. Prior fluoro of diaphragm -  R diaphragm elevated at rest but both moved with inspiration.   Marland Kitchen Spinal stenosis   . GERD (gastroesophageal reflux disease)     a. Hx GERD/esophageal dysmotility followed by Dr. Olevia Perches.   Marland Kitchen Hypertension   . Neuropathy     a. Hands, feet, legs.  . Orthostatic hypotension   . CAD (coronary artery disease)     a. Nonobst in 2011. b. Abnormal nuc 09/2013 -> s/p cutting balloon to D2; mild LAD disease 10/31/13.  . Osteomyelitis     a. Adm 04/2013: Charcot collapse of the right foot with osteomyelitis and ulceration, s/p excision.  . Venous insufficiency     a. Contributing to LEE.  . Type II diabetes mellitus   .  DJD (degenerative joint disease)   . Arthritis     "back; ankles; hands; knees" (10/31/2013)  . Recurrent UTI   . 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 1994 - malignant polyp removed during colonoscopy.  . Hiatal hernia     a. s/p Nissen fundoplication 1478.  Marland Kitchen RSD (reflex sympathetic dystrophy)     a.  Chronic pain.  . Abnormal echocardiogram     09/2013: mild LVH, mild focal basal hypertrophy of septum, EF 60-65%, normal WM, grade 1 diastolic dysfunction, MAC, mild LAE, ASA, PASP 34. Possible oscillating MV density - reviewed by MD and felt there was no significant abnormality other than MAC noted with mitral valve and did not require TEE.   Past Surgical History  Procedure Laterality Date  . Nissen fundoplication  12/02/5619  . Cholecystectomy    . Cataract extraction w/ intraocular lens  implant, bilateral Bilateral ~ 2003  . Rotator cuff repair  07/2007    Dr. Percell Miller  . Anterior cervical decomp/discectomy fusion  12/18/07    For OA,  Dr. Lorin Mercy:  fu by a PE  . Toe amputation Right 08/2008    3rd toe, Dr. Sharol Given due to osteomyelitis  . Nasal septum surgery    . Vein ligation Bilateral 1967  . Carpal tunnel release Left 09/2011  . Amputation  03/06/2012    Procedure: AMPUTATION FOOT;  Surgeon: Newt Minion, MD;  Location: [place];  Service: Orthopedics;  Laterality: Left;  FIFTH RAY AMPUTATION   . Vena cava filter placement  01/2010    green filter; "due to blood clots"  . Ankle fusion  09/27/2012    Procedure: ANKLE FUSION;  Surgeon: Newt Minion, MD;  Location: [place];  Service: Orthopedics;  Laterality: Left;  Left Tibiocalcaneal Fusion  . Ankle fusion Right 05/09/2013    Procedure: ANKLE FUSION;  Surgeon: Newt Minion, MD;  Location: [place];  Service: Orthopedics;  Laterality: Right;  Excision Osteomyelitis Base 1st MT Right Foot, Fusion Medial Column  . Cardiac catheterization  05/2010     [facility]  . Coronary angioplasty  10/31/2013  . Vaginal hysterectomy  ~ 1976    Medications: Prior to Admission medications   Medication Sig Start Date End Date Taking? Authorizing Provider  acetaminophen (TYLENOL) 500 MG tablet Take 500 mg by mouth daily as needed for mild pain or moderate pain.    Yes Historical Provider, MD  Alpha-Lipoic Acid 600 MG CAPS Take 600 mg by mouth 2 (two) times  daily.   Yes Historical Provider, MD  amLODipine-benazepril (LOTREL) 5-10 MG per capsule take 1 capsule by mouth once daily   Yes Colon Branch, MD  aspirin EC 81 MG tablet Take 1 tablet (81 mg total) by mouth daily. 10/29/13  Yes Liliane Shi, PA-C  Boron 3 MG CAPS Take 3 mg by mouth every evening.    Yes Historical Provider, MD  Cholecalciferol (VITAMIN D3) 5000 UNITS CAPS Take 5,000 Units by mouth every evening.   Yes Historical Provider, MD  Cyanocobalamin (VITAMIN B-12) 2500 MCG SUBL Place 2,500 mcg under the tongue daily.   Yes Historical Provider, MD  diclofenac sodium (VOLTAREN) 1 % GEL Apply 1 application topically daily as needed (pain).   Yes Historical Provider, MD  hydrochlorothiazide (MICROZIDE) 12.5 MG capsule Take 12.5 mg by mouth daily.   Yes Historical Provider, MD  HYDROcodone-acetaminophen (NORCO) 10-325 MG per tablet Take 1 tablet by mouth every 6 (six) hours  as needed for moderate pain or severe pain. 01/16/14  Yes Colon Branch, MD  hydrocortisone (ANUSOL-HC) 25 MG suppository Place 25 mg rectally 2 (two) times daily as needed for hemorrhoids.  10/14/13  Yes Lafayette Dragon, MD  hyoscyamine (LEVSIN SL) 0.125 MG SL tablet Place 1 tablet (0.125 mg total) under the tongue every 4 (four) hours as needed for cramping. 11/04/13  Yes Lafayette Dragon, MD  Krill Oil 300 MG CAPS Take 300 mg by mouth every evening.    Yes Historical Provider, MD  meclizine (ANTIVERT) 12.5 MG tablet Take 12.5 mg by mouth 3 (three) times daily as needed for dizziness.   Yes Historical Provider, MD  metoprolol succinate (TOPROL-XL) 25 MG 24 hr tablet Take 1 tablet (25 mg total) by mouth daily. 10/29/13  Yes Liliane Shi, PA-C  Multiple Vitamins-Minerals (PRESERVISION AREDS 2 PO) Take 2 tablets by mouth daily.   Yes Historical Provider, MD  nabumetone (RELAFEN) 750 MG tablet Take 750 mg by mouth 2 (two) times daily.   Yes Historical Provider, MD  omeprazole (PRILOSEC) 20 MG capsule Take 20 mg by mouth 2 (two) times  daily. 10/14/13  Yes Lafayette Dragon, MD  OVER THE COUNTER MEDICATION Take 400 mg by mouth 2 (two) times daily. tumeric   Yes Historical Provider, MD  OVER THE COUNTER MEDICATION Take 1 capsule by mouth 2 (two) times daily. Integrative Digestive Formula   Yes Historical Provider, MD  Polyvinyl Alcohol-Povidone (REFRESH OP) Place 1 drop into both eyes daily as needed (dry eyes).   Yes Historical Provider, MD  Probiotic Product (PROBIOTIC DAILY PO) Take 1 capsule by mouth daily.   Yes Historical Provider, MD    Allergies:   Allergies  Allergen Reactions  . Cymbalta [Duloxetine Hcl] Swelling    Swelling in legs  . Gabapentin Swelling    Swelling in legs  . Adhesive [Tape]     rash  . Cefazolin Hives  . Ciprofloxacin Other (See Comments)    Per pt, caused body aches   . Clorazepate Dipotassium Other (See Comments)    Unknown reaction  . Darifenacin Hydrobromide Other (See Comments)    hypotension, near syncope  . Dilaudid [Hydromorphone Hcl] Other (See Comments)     confused, intense itching  . Enablex [Darifenacin Hydrobromide Er] Other (See Comments)    Hypotension, near syncope  . Levofloxacin     Causes wrist pain  . Lyrica [Pregabalin] Swelling    Swelling in legs  . Methadone Hcl Other (See Comments)    Reaction unknown  . Morphine And Related Other (See Comments)    Confusion   . Pentazocine Lactate Other (See Comments)    Altered mental state, "climbing walls", anxiety  . Talwin [Pentazocine] Other (See Comments)    "climbing walls" anxiety  . Doxycycline Rash    Social History:  reports that she has never smoked. She has never used smokeless tobacco. She reports that she does not drink alcohol or use illicit drugs.  Family History: Family History  Problem Relation Age of Onset  . Migraines      headaches  . Hypertension    . Heart disease Mother     mitral valve replaced  . Rheum arthritis Mother   . Colon cancer Neg Hx   . Esophageal cancer Neg Hx   .  Stomach cancer Neg Hx   . Rectal cancer Neg Hx   . Diabetic kidney disease Daughter   . Breast cancer  Physical Exam: Blood pressure 155/68, pulse 92, temperature 97.9 F (36.6 C), temperature source Oral, resp. rate 16, SpO2 97.00%. General: Alert, awake, oriented x3, in no acute distress. HEENT: normocephalic, atraumatic, anicteric sclera, pink conjunctiva, pupils equal and reactive to light and accomodation, oropharynx clear Neck: supple, no masses or lymphadenopathy, no goiter, no bruits  Heart: Regular rate and rhythm, without murmurs, rubs or gallops. Lungs: Clear to auscultation bilaterally, no wheezing, rales or rhonchi. Abdomen: Soft, diffuse tenderness to palpation, nondistended, positive bowel sounds, no masses. Extremities: No clubbing, cyanosis or edema with positive pedal pulses. Neuro: Grossly intact, no focal neurological deficits, strength 5/5 upper and lower extremities bilaterally Psych: alert and oriented x 3, normal mood and affect Skin: no rashes or lesions, warm and dry   LABS on Admission:  Basic Metabolic Panel:  Recent Labs Lab 01/28/14 1740  NA 139  K 4.0  CL 104  GLUCOSE 169*  BUN 11  CREATININE 0.70   Liver Function Tests:  Recent Labs Lab 01/28/14 1735  AST 13  ALT 7  ALKPHOS 74  BILITOT 0.4  PROT 7.4  ALBUMIN 3.9   No results found for this basename: LIPASE, AMYLASE,  in the last 168 hours No results found for this basename: AMMONIA,  in the last 168 hours CBC:  Recent Labs Lab 01/28/14 1735 01/28/14 1740  WBC 6.1  --   NEUTROABS 4.6  --   HGB 12.6 14.3  HCT 39.0 42.0  MCV 87.2  --   PLT 193  --    Cardiac Enzymes: No results found for this basename: CKTOTAL, CKMB, CKMBINDEX, TROPONINI,  in the last 168 hours BNP: No components found with this basename: POCBNP,  CBG: No results found for this basename: GLUCAP,  in the last 168 hours   Radiological Exams on Admission: Ct Chest Wo Contrast  01/08/2014   CLINICAL  DATA:  Followup pulmonary nodule  EXAM: CT CHEST WITHOUT CONTRAST  TECHNIQUE: Multidetector CT imaging of the chest was performed following the standard protocol without IV contrast.  COMPARISON:  08/27/2009  FINDINGS: No pleural effusions identified. No airspace consolidation or atelectasis. Right middle nodule is unchanged from previous exam measuring 1.2 x 0.8 cm, image 27/series 3. Scar is identified within the right apex. No new or enlarging pulmonary nodules or mass identified.  The heart size is normal. No pericardial effusion. Calcified atherosclerotic disease involves the aortic arch. Calcifications within the LAD coronary artery also noted. No enlarged mediastinal or hilar lymph nodes identified. There is no axillary or supraclavicular adenopathy.  Incidental imaging through the upper abdomen shows a stable subcapsular hypodensity within the left hepatic lobe measuring 9 mm, image 51/series 2. The patient is status post cholecystectomy.  Review of the visualized osseous structures is significant for mild thoracic spondylosis. There are no aggressive lytic or sclerotic bone lesions identified.  IMPRESSION: 1. No acute cardiopulmonary abnormalities 2. Pulmonary nodule in the right lung is unchanged from previous exam and most likely benign.   Electronically Signed   By: Kerby Moors M.D.   On: 01/08/2014 12:30   Ct Abdomen Pelvis W Contrast  01/28/2014   CLINICAL DATA:  Abdominal pain post colonoscopy today.  EXAM: CT ABDOMEN AND PELVIS WITH CONTRAST  TECHNIQUE: Multidetector CT imaging of the abdomen and pelvis was performed using the standard protocol following bolus administration of intravenous contrast.  CONTRAST:  25mL OMNIPAQUE IOHEXOL 300 MG/ML SOLN, 110mL OMNIPAQUE IOHEXOL 300 MG/ML SOLN  COMPARISON:  07/24/2008  FINDINGS: Lung bases are  within normal. Findings likely representing prior hiatal hernia repair.  Abdominal images demonstrate surgical absence of the gallbladder. There is a 1.1 cm  hypodensity over the lateral segment of the left lobe of the liver unchanged likely a small cyst or hemangioma. The spleen, pancreas and adrenal glands are within normal. There is a 1.1 cm simple cyst over the upper pole of the left kidney. Right kidney is within normal. There is no hydronephrosis or nephrolithiasis. Ureters are within normal. IVC filter is present below the level of the renal veins. There is mild calcified plaque over the abdominal aorta. There is mild diverticulosis of the colon. Appendix is difficult to visualize but small likely within normal. There are 2 linear metallic densities patient over the cecum with somewhat mass like wall thickening of the cecal tip. These metallic densities may be from recent surgical procedure.  There is subtle stranding of the mesenteric fat just posterior to the cecal tip adjacent the terminal ileum as the terminal ileum is otherwise within normal. There is no evidence of adenopathy. There is a very small umbilical hernia containing only mesenteric fat.  Pelvic images demonstrate surgical absence of uterus. There is a fleck of air over the nondependent portion of gallbladder which may be due to recent instrumentation. There is a mild L1 compression fracture unchanged from MRI lumbar spine 03/18/2013.  IMPRESSION: Masslike wall thickening involving the cecal tip with 2 associated linear metallic densities suggesting prior surgical procedure in this region. Subtle stranding of the adjacent mesenteric fat although the adjacent terminal ileum and small caliber appendix are unremarkable. This finding may be seen due to focal colitis from infectious or inflammatory cause versus neoplasm. Recommend correlation with patient's recent colonoscopy findings.  Stable 1.1 cm liver hypodensity likely a small hemangioma.  Mild diverticulosis of the colon.  1.1 cm left renal cyst.  Small fleck of air within the nondependent portion of the bladder likely due to recent  instrumentation, although can't be seen due to infection or enteric fistula.  Stable L1 compression fracture.   Electronically Signed   By: Marin Olp M.D.   On: 01/28/2014 19:38    Assessment/Plan Principal Problem:   Abdominal pain likely due to focal colitis versus neoplasm: started after colonoscopy today - For now patient will be admitted under observation, IV fluids, IV Zosyn,  Pain control - Placed on clear liquid diet, advance slowly   - Check amylase, lipase  - GI will follow in AM  Active Problems:    HYPERTENSION - Restart outpatient medications, hydralazine as needed    HIATAL HERNIA - Placed on IV PPI  DVT prophylaxis: SCD's  CODE STATUS: Full Code  Family Communication: Admission, patients condition and plan of care including tests being ordered have been discussed with the patient and family who indicates understanding and agree with the plan and Code Status   Further plan will depend as patient's clinical course evolves and further radiologic and laboratory data become available.   Time Spent on Admission: 1 hour  Ripudeep Krystal Eaton M.D. [facility] 01/28/2014, 8:17 PM Pager: [phone]  If 7PM-7AM, please contact night-coverage [email or web address] Password [email or web address]

## 2014-01-28 NOTE — ED Notes (Signed)
Pt to ED from endoscopy/ colonoscopy clinic with post procedure pain. Dr. Olevia Perches sent pt for further evaluation and pain management.

## 2014-01-28 NOTE — ED Provider Notes (Signed)
CSN: [id number]     Arrival date & time 01/28/14  1701 History   First MD Initiated Contact with Patient 01/28/14 1707     Chief Complaint  Patient presents with  . Post-op Problem     (Consider location/radiation/quality/duration/timing/severity/associated sxs/prior Treatment) The history is provided by the patient and medical records.   patient had EGD and colonoscopy done this afternoon. Sent by Dr. Olevia Perches to the ER for further evaluation for pain after the procedure. Patient does not know exactly why she is here. Review of the records shows a cecal polyp that will likely need laparoscopic removal.  Past Medical History  Diagnosis Date  . Hyperlipidemia     a. patient unwilling to use statins.  . Osteoporosis   . DVT (deep venous thrombosis) 12/2007  . PE (pulmonary embolism) 12/2007    a. PE/DVT after neck surgery 2009. b. coumadin d/c 10-2008.  . Pulmonary nodule     incidental per CT:  Pet scan 4-9: likely benign, CT 08-2009 no change, no further CTs (Dr. Gwenette Greet)  . Hemorrhoids   . IBS (irritable bowel syndrome)   . Diverticulosis   . PPD positive   . Macular degeneration 03/2009    Dr. Rosana Hoes  . Allergic rhinitis   . Dyspnea     a. Chronic, extensive w/u see OV note 09-2010. b. [place] 2011: ormal with RA 5 RV 31/2 PA 27/12 (19) PCW 10 CO normal. No evidence of shunting with sitting up in cath lab. c. CPX 2011: see report. d. Prior fluoro of diaphragm -  R diaphragm elevated at rest but both moved with inspiration.   Marland Kitchen Spinal stenosis   . GERD (gastroesophageal reflux disease)     a. Hx GERD/esophageal dysmotility followed by Dr. Olevia Perches.   Marland Kitchen Hypertension   . Neuropathy     a. Hands, feet, legs.  . Orthostatic hypotension   . CAD (coronary artery disease)     a. Nonobst in 2011. b. Abnormal nuc 09/2013 -> s/p cutting balloon to D2; mild LAD disease 10/31/13.  . Osteomyelitis     a. Adm 04/2013: Charcot collapse of the right foot with osteomyelitis and ulceration, s/p excision.  .  Venous insufficiency     a. Contributing to LEE.  . Type II diabetes mellitus   . DJD (degenerative joint disease)   . Arthritis     "back; ankles; hands; knees" (10/31/2013)  . Recurrent UTI   . 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 1994 - malignant polyp removed during colonoscopy.  . Hiatal hernia     a. s/p Nissen fundoplication 4315.  Marland Kitchen RSD (reflex sympathetic dystrophy)     a. Chronic pain.  . Abnormal echocardiogram     09/2013: mild LVH, mild focal basal hypertrophy of septum, EF 60-65%, normal WM, grade 1 diastolic dysfunction, MAC, mild LAE, ASA, PASP 34. Possible oscillating MV density - reviewed by MD and felt there was no significant abnormality other than MAC noted with mitral valve and did not require TEE.   Past Surgical History  Procedure Laterality Date  . Nissen fundoplication  4/0/0867  . Cholecystectomy    . Cataract extraction w/ intraocular lens  implant, bilateral Bilateral ~ 2003  . Rotator cuff repair  07/2007    Dr. Percell Miller  . Anterior cervical decomp/discectomy fusion  12/18/07    For OA,  Dr. Lorin Mercy:  fu by a PE  . Toe amputation Right 08/2008    3rd toe, Dr. Sharol Given  due to osteomyelitis  . Nasal septum surgery    . Vein ligation Bilateral 1967  . Carpal tunnel release Left 09/2011  . Amputation  03/06/2012    Procedure: AMPUTATION FOOT;  Surgeon: Newt Minion, MD;  Location: [place];  Service: Orthopedics;  Laterality: Left;  FIFTH RAY AMPUTATION   . Vena cava filter placement  01/2010    green filter; "due to blood clots"  . Ankle fusion  09/27/2012    Procedure: ANKLE FUSION;  Surgeon: Newt Minion, MD;  Location: [place];  Service: Orthopedics;  Laterality: Left;  Left Tibiocalcaneal Fusion  . Ankle fusion Right 05/09/2013    Procedure: ANKLE FUSION;  Surgeon: Newt Minion, MD;  Location: [place];  Service: Orthopedics;  Laterality: Right;  Excision Osteomyelitis Base 1st MT Right Foot, Fusion Medial Column  . Cardiac catheterization  05/2010     at [facility]  . Coronary angioplasty  10/31/2013  . Vaginal hysterectomy  ~ 1976   Family History  Problem Relation Age of Onset  . Migraines      headaches  . Hypertension    . Heart disease Mother     mitral valve replaced  . Rheum arthritis Mother   . Colon cancer Neg Hx   . Esophageal cancer Neg Hx   . Stomach cancer Neg Hx   . Rectal cancer Neg Hx   . Diabetic kidney disease Daughter   . Breast cancer     History  Substance Use Topics  . Smoking status: Never Smoker   . Smokeless tobacco: Never Used  . Alcohol Use: No   OB History   Grav Para Term Preterm Abortions TAB SAB Ect Mult Living                 Review of Systems  Constitutional: Negative for activity change, appetite change and fatigue.  Eyes: Negative for pain.  Respiratory: Negative for chest tightness and shortness of breath.   Cardiovascular: Negative for chest pain and leg swelling.  Gastrointestinal: Positive for abdominal pain. Negative for nausea, vomiting and diarrhea.  Genitourinary: Negative for flank pain.  Musculoskeletal: Negative for back pain and neck stiffness.  Neurological: Negative for weakness, numbness and headaches.  Psychiatric/Behavioral: Negative for behavioral problems.      Allergies  Cymbalta; Gabapentin; Adhesive; Cefazolin; Ciprofloxacin; Clorazepate dipotassium; Darifenacin hydrobromide; Dilaudid; Enablex; Levofloxacin; Lyrica; Methadone hcl; Morphine and related; Pentazocine lactate; Talwin; and Doxycycline  Home Medications   No current outpatient prescriptions on file. BP 119/75  Pulse 98  Temp(Src) 99.3 F (37.4 C) (Oral)  Resp 18  SpO2 95% Physical Exam  Nursing note and vitals reviewed. Constitutional: She is oriented to person, place, and time. She appears well-developed and well-nourished.  HENT:  Head: Normocephalic and atraumatic.  Eyes: EOM are normal. Pupils are equal, round, and reactive to light.  Neck: Normal range of motion. Neck supple.   Cardiovascular: Normal rate, regular rhythm and normal heart sounds.   No murmur heard. Pulmonary/Chest: Effort normal and breath sounds normal. No respiratory distress. She has no wheezes. She has no rales.  Abdominal: Soft. She exhibits no distension. There is tenderness. There is no rebound and no guarding.  Lower abdominal tenderness without rebound or guarding.  Musculoskeletal: Normal range of motion.  Neurological: She is alert and oriented to person, place, and time. No cranial nerve deficit.  Skin: Skin is warm and dry.  Psychiatric: She has a normal mood and affect. Her speech is normal.  ED Course  Procedures (including critical care time) Labs Review Labs Reviewed  I-STAT CHEM 8, ED - Abnormal; Notable for the following:    Glucose, Bld 169 (*)    All other components within normal limits  CBC WITH DIFFERENTIAL  PROTIME-INR  HEPATIC FUNCTION PANEL  APTT  LIPASE, BLOOD  AMYLASE  BASIC METABOLIC PANEL  CBC   Imaging Review Ct Abdomen Pelvis W Contrast  01/28/2014   CLINICAL DATA:  Abdominal pain post colonoscopy today.  EXAM: CT ABDOMEN AND PELVIS WITH CONTRAST  TECHNIQUE: Multidetector CT imaging of the abdomen and pelvis was performed using the standard protocol following bolus administration of intravenous contrast.  CONTRAST:  50mL OMNIPAQUE IOHEXOL 300 MG/ML SOLN, 122mL OMNIPAQUE IOHEXOL 300 MG/ML SOLN  COMPARISON:  07/24/2008  FINDINGS: Lung bases are within normal. Findings likely representing prior hiatal hernia repair.  Abdominal images demonstrate surgical absence of the gallbladder. There is a 1.1 cm hypodensity over the lateral segment of the left lobe of the liver unchanged likely a small cyst or hemangioma. The spleen, pancreas and adrenal glands are within normal. There is a 1.1 cm simple cyst over the upper pole of the left kidney. Right kidney is within normal. There is no hydronephrosis or nephrolithiasis. Ureters are within normal. IVC filter is present  below the level of the renal veins. There is mild calcified plaque over the abdominal aorta. There is mild diverticulosis of the colon. Appendix is difficult to visualize but small likely within normal. There are 2 linear metallic densities patient over the cecum with somewhat mass like wall thickening of the cecal tip. These metallic densities may be from recent surgical procedure.  There is subtle stranding of the mesenteric fat just posterior to the cecal tip adjacent the terminal ileum as the terminal ileum is otherwise within normal. There is no evidence of adenopathy. There is a very small umbilical hernia containing only mesenteric fat.  Pelvic images demonstrate surgical absence of uterus. There is a fleck of air over the nondependent portion of gallbladder which may be due to recent instrumentation. There is a mild L1 compression fracture unchanged from MRI lumbar spine 03/18/2013.  IMPRESSION: Masslike wall thickening involving the cecal tip with 2 associated linear metallic densities suggesting prior surgical procedure in this region. Subtle stranding of the adjacent mesenteric fat although the adjacent terminal ileum and small caliber appendix are unremarkable. This finding may be seen due to focal colitis from infectious or inflammatory cause versus neoplasm. Recommend correlation with patient's recent colonoscopy findings.  Stable 1.1 cm liver hypodensity likely a small hemangioma.  Mild diverticulosis of the colon.  1.1 cm left renal cyst.  Small fleck of air within the nondependent portion of the bladder likely due to recent instrumentation, although can't be seen due to infection or enteric fistula.  Stable L1 compression fracture.   Electronically Signed   By: Marin Olp M.D.   On: 01/28/2014 19:38     EKG Interpretation None      MDM   Final diagnoses:  Abdominal pain    Patient with abdominal pain after colonoscopy sent by GI. CT nonspecific. Admit to internal  medicine    Jasper Riling. Alvino Chapel, MD 01/28/14 [phone]

## 2014-01-28 NOTE — Patient Instructions (Signed)
YOU HAD AN ENDOSCOPIC PROCEDURE TODAY AT [facility]: Refer to the procedure report that was given to you for any specific questions about what was found during the examination.  If the procedure report does not answer your questions, please call your gastroenterologist to clarify.  If you requested that your care partner not be given the details of your procedure findings, then the procedure report has been included in a sealed envelope for you to review at your convenience later.  YOU SHOULD EXPECT: Some feelings of bloating in the abdomen. Passage of more gas than usual.  Walking can help get rid of the air that was put into your GI tract during the procedure and reduce the bloating. If you had a lower endoscopy (such as a colonoscopy or flexible sigmoidoscopy) you may notice spotting of blood in your stool or on the toilet paper. If you underwent a bowel prep for your procedure, then you may not have a normal bowel movement for a few days.  DIET: See dilation diet handout -  Drink plenty of fluids but you should avoid alcoholic beverages for 24 hours.  ACTIVITY: Your care partner should take you home directly after the procedure.  You should plan to take it easy, moving slowly for the rest of the day.  You can resume normal activity the day after the procedure however you should NOT DRIVE or use heavy machinery for 24 hours (because of the sedation medicines used during the test).    SYMPTOMS TO REPORT IMMEDIATELY: A gastroenterologist can be reached at any hour.  During normal business hours, 8:30 AM to 5:00 PM Monday through Friday, call [phone].  After hours and on weekends, please call the GI answering service at [phone] who will take a message and have the physician on call contact you.   Following lower endoscopy (colonoscopy or flexible sigmoidoscopy):  Excessive amounts of blood in the stool  Significant tenderness or worsening of abdominal pains  Swelling of  the abdomen that is new, acute  Fever of 100F or higher  Following upper endoscopy (EGD)  Vomiting of blood or coffee ground material  New chest pain or pain under the shoulder blades  Painful or persistently difficult swallowing  New shortness of breath  Fever of 100F or higher  Black, tarry-looking stools  FOLLOW UP: If any biopsies were taken you will be contacted by phone or by letter within the next 1-3 weeks.  Call your gastroenterologist if you have not heard about the biopsies in 3 weeks.  Our staff will call the home number listed on your records the next business day following your procedure to check on you and address any questions or concerns that you may have at that time regarding the information given to you following your procedure. This is a courtesy call and so if there is no answer at the home number and we have not heard from you through the emergency physician on call, we will assume that you have returned to your regular daily activities without incident.  SIGNATURES/CONFIDENTIALITY: You and/or your care partner have signed paperwork which will be entered into your electronic medical record.  These signatures attest to the fact that that the information above on your After Visit Summary has been reviewed and is understood.  Full responsibility of the confidentiality of this discharge information lies with you and/or your care-partner.  Dilation diet, stricture, polyps-handouts given  Wait pathology.  Anti-reflux regimen.

## 2014-01-28 NOTE — Progress Notes (Signed)
I spoke with Dr Olevia Perches this evening. Case reviewed. CT reviewed. No perforation. 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 WBC normal. Agree with plans for overnight observation. Expect that she will do well. We will have inpatient GI team (Dr Deatra Ina) check on patient in morning.

## 2014-01-29 ENCOUNTER — Telehealth: Payer: Self-pay | Admitting: *Deleted

## 2014-01-29 DIAGNOSIS — R197 Diarrhea, unspecified: Secondary | ICD-10-CM | POA: Diagnosis present

## 2014-01-29 DIAGNOSIS — K219 Gastro-esophageal reflux disease without esophagitis: Secondary | ICD-10-CM | POA: Diagnosis not present

## 2014-01-29 DIAGNOSIS — E119 Type 2 diabetes mellitus without complications: Secondary | ICD-10-CM | POA: Diagnosis not present

## 2014-01-29 DIAGNOSIS — K5289 Other specified noninfective gastroenteritis and colitis: Secondary | ICD-10-CM | POA: Diagnosis not present

## 2014-01-29 DIAGNOSIS — R110 Nausea: Secondary | ICD-10-CM | POA: Diagnosis not present

## 2014-01-29 DIAGNOSIS — R109 Unspecified abdominal pain: Secondary | ICD-10-CM | POA: Diagnosis not present

## 2014-01-29 LAB — BASIC METABOLIC PANEL
BUN: 8 mg/dL (ref 6–23)
CO2: 23 mEq/L (ref 19–32)
Calcium: 9 mg/dL (ref 8.4–10.5)
Chloride: 104 mEq/L (ref 96–112)
Creatinine, Ser: 0.65 mg/dL (ref 0.50–1.10)
GFR calc Af Amer: >90 mL/min (ref >90)
GFR calc non Af Amer: 84 mL/min — ABNORMAL LOW (ref >90)
Glucose, Bld: 146 mg/dL — ABNORMAL HIGH (ref 70–99)
Potassium: 4.3 mEq/L (ref 3.7–5.3)
Sodium: 140 mEq/L (ref 137–147)

## 2014-01-29 LAB — CBC
HCT: 36.5 % (ref 36.0–46.0)
Hemoglobin: 11.8 g/dL — ABNORMAL LOW (ref 12.0–15.0)
MCH: 28.2 pg (ref 26.0–34.0)
MCHC: 32.3 g/dL (ref 30.0–36.0)
MCV: 87.3 fL (ref 78.0–100.0)
Platelets: 194 10*3/uL (ref 150–400)
RBC: 4.18 MIL/uL (ref 3.87–5.11)
RDW: 14.5 % (ref 11.5–15.5)
WBC: 11.3 10*3/uL — ABNORMAL HIGH (ref 4.0–10.5)

## 2014-01-29 MED ORDER — PANTOPRAZOLE SODIUM 40 MG PO TBEC
40.0000 mg | DELAYED_RELEASE_TABLET | Freq: Two times a day (BID) | ORAL | Status: DC
  Administered 2014-01-29 – 2014-01-31 (×4): 40 mg via ORAL
  Filled 2014-01-29 (×5): qty 1

## 2014-01-29 MED ORDER — HYDRALAZINE HCL 20 MG/ML IJ SOLN
10.0000 mg | Freq: Four times a day (QID) | INTRAMUSCULAR | Status: DC | PRN
  Filled 2014-01-29: qty 0.5

## 2014-01-29 NOTE — Discharge Instructions (Signed)
Abdominal Pain, Adult °Many things can cause abdominal pain. Usually, abdominal pain is not caused by a disease and will improve without treatment. It can often be observed and treated at home. Your health care provider will do a physical exam and possibly order blood tests and X-rays to help determine the seriousness of your pain. However, in many cases, more time must pass before a clear cause of the pain can be found. Before that point, your health care provider may not know if you need more testing or further treatment. °HOME CARE INSTRUCTIONS  °Monitor your abdominal pain for any changes. The following actions may help to alleviate any discomfort you are experiencing: °· Only take over-the-counter or prescription medicines as directed by your health care provider. °· Do not take laxatives unless directed to do so by your health care provider. °· Try a clear liquid diet (broth, tea, or water) as directed by your health care provider. Slowly move to a bland diet as tolerated. °SEEK MEDICAL CARE IF: °· You have unexplained abdominal pain. °· You have abdominal pain associated with nausea or diarrhea. °· You have pain when you urinate or have a bowel movement. °· You experience abdominal pain that wakes you in the night. °· You have abdominal pain that is worsened or improved by eating food. °· You have abdominal pain that is worsened with eating fatty foods. °SEEK IMMEDIATE MEDICAL CARE IF:  °· Your pain does not go away within 2 hours. °· You have a fever. °· You keep throwing up (vomiting). °· Your pain is felt only in portions of the abdomen, such as the right side or the left lower portion of the abdomen. °· You pass bloody or black tarry stools. °MAKE SURE YOU: °· Understand these instructions.   °· Will watch your condition.   °· Will get help right away if you are not doing well or get worse.   °Document Released: 07/19/2005 Document Revised: 07/30/2013 Document Reviewed: 06/18/2013 °[employer]® Patient  Information ©2014 [employer]. ° °

## 2014-01-29 NOTE — Consult Note (Signed)
Referring Provider: No ref. provider found Primary Care Physician:  [name], MD Primary Gastroenterologist:  Dr. Olevia Perches  Reason for Consultation:  Abdominal pain post colonoscopy  HPI: [name] is a 79 y.o. female with history of hypertension, hyperlipidemia, GERD, RSD, neuropathy, spinal stenosis, and CAD.  She was sent by Dr. Olevia Perches to the ER for abdominal pain after the colonoscopy yesterday.  During the colonoscopy she was found to have a cecal polyp, which will likely need laparoscopic removal since she had difficulty removing it via colonoscopy and it required piecemeal removal with hot snare as well as endo-clips and epi injection in the cecum.  After the colonoscopy procedure, patient continued to have abdominal pain and Dr. Olevia Perches wanted to rule out perforation vs post-polypectomy syndrome.  CT scan of the abdomen and pelvis was done, which showed a masslike wall thickening involving the cecal tip and subtle stranding of the adjacent mesenteric fat.  It was recommended that she be observed overnight for pain control.  She was placed empirically on IV antibiotics (Zosyn) as well.  She is feeling better this AM.  Still has pain in RLQ but says that it is much improved and thinks that it could be tolerated/managed at home.  Denies nausea or vomiting and tolerated a clear liquid diet.    Past Medical History  Diagnosis Date  . Hyperlipidemia     a. patient unwilling to use statins.  . Osteoporosis   . DVT (deep venous thrombosis) 12/2007  . PE (pulmonary embolism) 12/2007    a. PE/DVT after neck surgery 2009. b. coumadin d/c 10-2008.  . Pulmonary nodule     incidental per CT:  Pet scan 4-9: likely benign, CT 08-2009 no change, no further CTs (Dr. Gwenette Greet)  . Hemorrhoids   . IBS (irritable bowel syndrome)   . Diverticulosis   . PPD positive   . Macular degeneration 03/2009    Dr. Rosana Hoes  . Allergic rhinitis   . Dyspnea     a. Chronic, extensive w/u see OV note 09-2010. b. Rhine 2011:  ormal with RA 5 RV 31/2 PA 27/12 (19) PCW 10 CO normal. No evidence of shunting with sitting up in cath lab. c. CPX 2011: see report. d. Prior fluoro of diaphragm -  R diaphragm elevated at rest but both moved with inspiration.   Marland Kitchen Spinal stenosis   . GERD (gastroesophageal reflux disease)     a. Hx GERD/esophageal dysmotility followed by Dr. Olevia Perches.   Marland Kitchen Hypertension   . Neuropathy     a. Hands, feet, legs.  . Orthostatic hypotension   . CAD (coronary artery disease)     a. Nonobst in 2011. b. Abnormal nuc 09/2013 -> s/p cutting balloon to D2; mild LAD disease 10/31/13.  . Osteomyelitis     a. Adm 04/2013: Charcot collapse of the right foot with osteomyelitis and ulceration, s/p excision.  . Venous insufficiency     a. Contributing to LEE.  . Type II diabetes mellitus   . DJD (degenerative joint disease)   . Arthritis     "back; ankles; hands; knees" (10/31/2013)  . Recurrent UTI   . 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 1994 - malignant polyp removed during colonoscopy.  . Hiatal hernia     a. s/p Nissen fundoplication 6295.  Marland Kitchen RSD (reflex sympathetic dystrophy)     a. Chronic pain.  . Abnormal echocardiogram     09/2013: mild LVH, mild focal basal hypertrophy of  septum, EF 60-65%, normal WM, grade 1 diastolic dysfunction, MAC, mild LAE, ASA, PASP 34. Possible oscillating MV density - reviewed by MD and felt there was no significant abnormality other than MAC noted with mitral valve and did not require TEE.    Past Surgical History  Procedure Laterality Date  . Nissen fundoplication  [id number]  . Cholecystectomy    . Cataract extraction w/ intraocular lens  implant, bilateral Bilateral ~ 2003  . Rotator cuff repair  07/2007    Dr. Percell Miller  . Anterior cervical decomp/discectomy fusion  12/18/07    For OA,  Dr. Lorin Mercy:  fu by a PE  . Toe amputation Right 08/2008    3rd toe, Dr. Sharol Given due to osteomyelitis  . Nasal septum surgery    . Vein ligation Bilateral 1967  . Carpal tunnel  release Left 09/2011  . Amputation  03/06/2012    Procedure: AMPUTATION FOOT;  Surgeon: Newt Minion, MD;  Location: [facility];  Service: Orthopedics;  Laterality: Left;  FIFTH RAY AMPUTATION   . Vena cava filter placement  01/2010    green filter; "due to blood clots"  . Ankle fusion  09/27/2012    Procedure: ANKLE FUSION;  Surgeon: Newt Minion, MD;  Location: [facility];  Service: Orthopedics;  Laterality: Left;  Left Tibiocalcaneal Fusion  . Ankle fusion Right 05/09/2013    Procedure: ANKLE FUSION;  Surgeon: Newt Minion, MD;  Location: [facility];  Service: Orthopedics;  Laterality: Right;  Excision Osteomyelitis Base 1st MT Right Foot, Fusion Medial Column  . Cardiac catheterization  05/2010     at [facility]  . Coronary angioplasty  10/31/2013  . Vaginal hysterectomy  ~ 1976    Prior to Admission medications   Medication Sig Start Date End Date Taking? Authorizing Provider  acetaminophen (TYLENOL) 500 MG tablet Take 500 mg by mouth daily as needed for mild pain or moderate pain.    Yes Historical Provider, MD  Alpha-Lipoic Acid 600 MG CAPS Take 600 mg by mouth 2 (two) times daily.   Yes Historical Provider, MD  amLODipine-benazepril (LOTREL) 5-10 MG per capsule take 1 capsule by mouth once daily   Yes Colon Branch, MD  aspirin EC 81 MG tablet Take 1 tablet (81 mg total) by mouth daily. 10/29/13  Yes Liliane Shi, PA-C  Boron 3 MG CAPS Take 3 mg by mouth every evening.    Yes Historical Provider, MD  Cholecalciferol (VITAMIN D3) 5000 UNITS CAPS Take 5,000 Units by mouth every evening.   Yes Historical Provider, MD  Cyanocobalamin (VITAMIN B-12) 2500 MCG SUBL Place 2,500 mcg under the tongue daily.   Yes Historical Provider, MD  diclofenac sodium (VOLTAREN) 1 % GEL Apply 1 application topically daily as needed (pain).   Yes Historical Provider, MD  hydrochlorothiazide (MICROZIDE) 12.5 MG capsule Take 12.5 mg by mouth daily.   Yes Historical Provider, MD  HYDROcodone-acetaminophen (NORCO) 10-325 MG per  tablet Take 1 tablet by mouth every 6 (six) hours as needed for moderate pain or severe pain. 01/16/14  Yes Colon Branch, MD  hydrocortisone (ANUSOL-HC) 25 MG suppository Place 25 mg rectally 2 (two) times daily as needed for hemorrhoids.  10/14/13  Yes Lafayette Dragon, MD  hyoscyamine (LEVSIN SL) 0.125 MG SL tablet Place 1 tablet (0.125 mg total) under the tongue every 4 (four) hours as needed for cramping. 11/04/13  Yes Lafayette Dragon, MD  Krill Oil 300 MG CAPS Take 300 mg by mouth  every evening.    Yes Historical Provider, MD  meclizine (ANTIVERT) 12.5 MG tablet Take 12.5 mg by mouth 3 (three) times daily as needed for dizziness.   Yes Historical Provider, MD  metoprolol succinate (TOPROL-XL) 25 MG 24 hr tablet Take 1 tablet (25 mg total) by mouth daily. 10/29/13  Yes Liliane Shi, PA-C  Multiple Vitamins-Minerals (PRESERVISION AREDS 2 PO) Take 2 tablets by mouth daily.   Yes Historical Provider, MD  nabumetone (RELAFEN) 750 MG tablet Take 750 mg by mouth 2 (two) times daily.   Yes Historical Provider, MD  omeprazole (PRILOSEC) 20 MG capsule Take 20 mg by mouth 2 (two) times daily. 10/14/13  Yes Lafayette Dragon, MD  OVER THE COUNTER MEDICATION Take 400 mg by mouth 2 (two) times daily. tumeric   Yes Historical Provider, MD  OVER THE COUNTER MEDICATION Take 1 capsule by mouth 2 (two) times daily. Integrative Digestive Formula   Yes Historical Provider, MD  Polyvinyl Alcohol-Povidone (REFRESH OP) Place 1 drop into both eyes daily as needed (dry eyes).   Yes Historical Provider, MD  Probiotic Product (PROBIOTIC DAILY PO) Take 1 capsule by mouth daily.   Yes Historical Provider, MD    Current Facility-Administered Medications  Medication Dose Route Frequency Provider Last Rate Last Dose  . 0.9 %  sodium chloride infusion   Intravenous Continuous Ripudeep Krystal Eaton, MD 75 mL/hr at 01/28/14 2150    . acetaminophen (TYLENOL) tablet 650 mg  650 mg Oral Q6H PRN Ripudeep Krystal Eaton, MD       Or  . acetaminophen (TYLENOL)  suppository 650 mg  650 mg Rectal Q6H PRN Ripudeep K Rai, MD      . amLODipine (NORVASC) tablet 5 mg  5 mg Oral Daily Ripudeep K Rai, MD   5 mg at 01/29/14 0911   And  . benazepril (LOTENSIN) tablet 10 mg  10 mg Oral Daily Ripudeep Krystal Eaton, MD   10 mg at 01/29/14 0910  . fentaNYL (SUBLIMAZE) injection 12.5-25 mcg  12.5-25 mcg Intravenous Q4H PRN Ripudeep Krystal Eaton, MD   25 mcg at 01/28/14 2150  . HYDROcodone-acetaminophen (NORCO) 10-325 MG per tablet 1 tablet  1 tablet Oral Q6H PRN Ripudeep Krystal Eaton, MD   1 tablet at 01/29/14 0728  . hydrocortisone (ANUSOL-HC) suppository 25 mg  25 mg Rectal BID PRN Ripudeep Krystal Eaton, MD      . hyoscyamine (LEVSIN SL) SL tablet 0.125 mg  0.125 mg Sublingual Q4H PRN Ripudeep Krystal Eaton, MD      . meclizine (ANTIVERT) tablet 12.5 mg  12.5 mg Oral TID PRN Ripudeep Krystal Eaton, MD      . metoprolol succinate (TOPROL-XL) 24 hr tablet 25 mg  25 mg Oral QHS Ripudeep K Rai, MD   25 mg at 01/28/14 2337  . nabumetone (RELAFEN) tablet 750 mg  750 mg Oral BID Ripudeep Krystal Eaton, MD   750 mg at 01/29/14 0910  . ondansetron (ZOFRAN) tablet 4 mg  4 mg Oral Q6H PRN Ripudeep K Rai, MD       Or  . ondansetron (ZOFRAN) injection 4 mg  4 mg Intravenous Q6H PRN Ripudeep Krystal Eaton, MD   4 mg at 01/28/14 2150  . pantoprazole (PROTONIX) injection 40 mg  40 mg Intravenous Q12H Ripudeep Krystal Eaton, MD   40 mg at 01/29/14 0910  . piperacillin-tazobactam (ZOSYN) IVPB 3.375 g  3.375 g Intravenous 3 times per day Ripudeep Krystal Eaton, MD   3.375 g at 01/29/14  7824    Allergies as of 01/28/2014 - Review Complete 01/28/2014  Allergen Reaction Noted  . Cymbalta [duloxetine hcl] Swelling 09/25/2012  . Gabapentin Swelling 09/25/2012  . Adhesive [tape]  07/17/2011  . Cefazolin Hives 03/06/2012  . Ciprofloxacin Other (See Comments) 01/31/2013  . Clorazepate dipotassium Other (See Comments)   . Darifenacin hydrobromide Other (See Comments) 03/30/2009  . Dilaudid [hydromorphone hcl] Other (See Comments) 03/11/2010  . Enablex  [darifenacin hydrobromide er] Other (See Comments) 03/06/2012  . Levofloxacin  01/08/2014  . Lyrica [pregabalin] Swelling 05/08/2013  . Methadone hcl Other (See Comments)   . Morphine and related Other (See Comments) 10/31/2013  . Pentazocine lactate Other (See Comments) 07/17/2011  . Talwin [pentazocine] Other (See Comments) 12/04/2012  . Doxycycline Rash 11/03/2008    Family History  Problem Relation Age of Onset  . Migraines      headaches  . Hypertension    . Heart disease Mother     mitral valve replaced  . Rheum arthritis Mother   . Colon cancer Neg Hx   . Esophageal cancer Neg Hx   . Stomach cancer Neg Hx   . Rectal cancer Neg Hx   . Diabetic kidney disease Daughter   . Breast cancer      History   Social History  . Marital Status: Divorced    Spouse Name: N/A    Number of Children: 3  . Years of Education: N/A   Occupational History  . Retired      from [employer]    Social History Main Topics  . Smoking status: Never Smoker   . Smokeless tobacco: Never Used  . Alcohol Use: No  . Drug Use: No  . Sexual Activity: No   Other Topics Concern  . Not on file   Social History Narrative   1 daughter lives w/ her , another daughter lives in town, 1 son in [place]              Review of Systems: Ten point ROS is O/W negative except as mentioned in HPI.  Physical Exam: Vital signs in last 24 hours: Temp:  [97.9 F (36.6 C)-99.3 F (37.4 C)] 98.5 F (36.9 C) (04/09 0508) Pulse Rate:  [70-98] 89 (04/09 0508) Resp:  [11-36] 18 (04/09 0508) BP: (100-169)/(59-119) 100/63 mmHg (04/09 0508) SpO2:  [92 %-100 %] 95 % (04/09 0508) Weight:  [198 lb (89.812 kg)] 198 lb (89.812 kg) (04/08 2220) Last BM Date: 01/28/14 General:  Alert, Well-developed, well-nourished, pleasant and cooperative in NAD Head:  Normocephalic and atraumatic. Eyes:  Sclera clear, no icterus.  Conjunctiva pink. Ears:  Normal auditory acuity. Mouth:  No deformity or lesions.   Lungs:   Clear throughout to auscultation.   No wheezes, crackles, or rhonchi.  Heart:  Regular rate and rhythm; no murmurs, clicks, rubs, or gallops. Abdomen:  Soft, non-distended.  BS present.  TTP in lower abdomen > in the RLQ without R/R/G.   Rectal:  Deferred  Msk:  Symmetrical without gross deformities. Pulses:  Normal pulses noted. Extremities:  Without clubbing or edema. Neurologic:  Alert and  oriented x4;  grossly normal neurologically. Skin:  Intact without significant lesions or rashes. Psych:  Alert and cooperative. Normal mood and affect.  Intake/Output from previous day: 04/08 0701 - 04/09 0700 In: 552.5 [I.V.:552.5] Out: 1100 [Urine:1100] Intake/Output this shift: Total I/O In: 240 [P.O.:240] Out: 900 [Urine:900]  Lab Results:  Recent Labs  01/28/14 1735 01/28/14 1740 01/29/14 0458  WBC 6.1  --  11.3*  HGB 12.6 14.3 11.8*  HCT 39.0 42.0 36.5  PLT 193  --  194   BMET  Recent Labs  01/28/14 1740 01/29/14 0458  NA 139 140  K 4.0 4.3  CL 104 104  CO2  --  23  GLUCOSE 169* 146*  BUN 11 8  CREATININE 0.70 0.65  CALCIUM  --  9.0   LFT  Recent Labs  01/28/14 1735  PROT 7.4  ALBUMIN 3.9  AST 13  ALT 7  ALKPHOS 74  BILITOT 0.4  BILIDIR <0.2  IBILI NOT CALCULATED   PT/INR  Recent Labs  01/28/14 1735  LABPROT 13.4  INR 1.04   Studies/Results: Ct Abdomen Pelvis W Contrast  01/28/2014   CLINICAL DATA:  Abdominal pain post colonoscopy today.  EXAM: CT ABDOMEN AND PELVIS WITH CONTRAST  TECHNIQUE: Multidetector CT imaging of the abdomen and pelvis was performed using the standard protocol following bolus administration of intravenous contrast.  CONTRAST:  36mL OMNIPAQUE IOHEXOL 300 MG/ML SOLN, 1105mL OMNIPAQUE IOHEXOL 300 MG/ML SOLN  COMPARISON:  07/24/2008  FINDINGS: Lung bases are within normal. Findings likely representing prior hiatal hernia repair.  Abdominal images demonstrate surgical absence of the gallbladder. There is a 1.1 cm hypodensity over the  lateral segment of the left lobe of the liver unchanged likely a small cyst or hemangioma. The spleen, pancreas and adrenal glands are within normal. There is a 1.1 cm simple cyst over the upper pole of the left kidney. Right kidney is within normal. There is no hydronephrosis or nephrolithiasis. Ureters are within normal. IVC filter is present below the level of the renal veins. There is mild calcified plaque over the abdominal aorta. There is mild diverticulosis of the colon. Appendix is difficult to visualize but small likely within normal. There are 2 linear metallic densities patient over the cecum with somewhat mass like wall thickening of the cecal tip. These metallic densities may be from recent surgical procedure.  There is subtle stranding of the mesenteric fat just posterior to the cecal tip adjacent the terminal ileum as the terminal ileum is otherwise within normal. There is no evidence of adenopathy. There is a very small umbilical hernia containing only mesenteric fat.  Pelvic images demonstrate surgical absence of uterus. There is a fleck of air over the nondependent portion of gallbladder which may be due to recent instrumentation. There is a mild L1 compression fracture unchanged from MRI lumbar spine 03/18/2013.  IMPRESSION: Masslike wall thickening involving the cecal tip with 2 associated linear metallic densities suggesting prior surgical procedure in this region. Subtle stranding of the adjacent mesenteric fat although the adjacent terminal ileum and small caliber appendix are unremarkable. This finding may be seen due to focal colitis from infectious or inflammatory cause versus neoplasm. Recommend correlation with patient's recent colonoscopy findings.  Stable 1.1 cm liver hypodensity likely a small hemangioma.  Mild diverticulosis of the colon.  1.1 cm left renal cyst.  Small fleck of air within the nondependent portion of the bladder likely due to recent instrumentation, although can't be  seen due to infection or enteric fistula.  Stable L1 compression fracture.   Electronically Signed   By: Marin Olp M.D.   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  CT negative for perforation.  Still has pain today,  but is improved and she thinks that it can be tolerated at home at this level.   PLAN: -Will advance to regular diet and then she can likely be discharged home later this afternoon if she tolerates that well. -She was placed on Zosyn, but likely will not need to be discharged on antibiotics.     Laban Emperor. Zehr  01/29/2014, 10:44 AM  Pager number [phone] Attending MD note:   I have taken a history, examined the patient, and reviewed the chart. I agree with the Advanced Practitioner's impression and recommendations. Post polypectomy syndrome, much improved today, OK to discharge on full liquids and soft food x 48 hours, no antibiotics. Path report pending, Will likely need lap assisted cecal polyps resection.  [facility] Pager # [phone]

## 2014-01-29 NOTE — Progress Notes (Signed)
Progress Note   [name] JQB:[id number] DOB: 04/01/1935 DOA: 01/28/2014 PCP: [name], MD   Brief Narrative:    [name] is an 79 y.o. female with a PMH of hypertension, hyperlipidemia, and GERD who was sent by Dr. Olevia Perches to the ER for abdominal pain after performing a colonoscopy 01/28/14. CT scan of the abdomen and pelvis showed some subtle abnormalities but focal colitis or neoplasm could not be completely ruled out, and was admitted and placed on IV antibiotics.  Assessment/Plan:    Principal Problem:   Abdominal pain after colonoscopy, rule out acute colitis, post polypectomy syndrome Patient was admitted and CT scan of the abdomen was done to rule out post-colonoscopy perforation or polypectomy burn. Seen by Dr. Olevia Perches today who felt that the patient's clinical presentation and CT findings are more consistent with a post polypectomy syndrome. Recommended advancement of diet and discontinuation of antibiotics. The patient initially felt better and was anticipated to discharge home this afternoon, but subsequently developed nausea, worsening abdominal pain, and was incontinent of diarrhea stools so her discharge has been placed on hold. GI pathogen panel has been requested. Active Problems:   DIABETES MELLITUS, TYPE II Patient does not appear to be on routine medications for diabetes. Likely diet controlled. Hemoglobin A1c 6.3% on 01/16/14. Maintain carbohydrate modified diet.   HYPERTENSION Continue metoprolol and amlodipine/benazepril. Blood pressure controlled. Continue PPI therapy.   DVT Prophylaxis Continue SCDs.  Code Status: Full. Family Communication: No family currently at the bedside. Disposition Plan: Home when stable.   IV access:  Peripheral IV  Procedures  None.  Medical Consultants:  Dr. Delfin Edis, Gastroenterology.  Other Consultants:  None.  Anti-infectives:  Zosyn 01/28/14---> 01/29/14.  Subjective:   [name] was feeling well  when I rounded on her earlier today. She had not yet tried solid foods, but denied nausea/vomiting. Abdominal pain had significantly improved. The patient developed nausea and worsening abdominal pain after eating lunch. The nursing staff also reported that she was incontinent of loose stools.  Objective:    Filed Vitals:   01/28/14 2243 01/29/14 0224 01/29/14 0508 01/29/14 1400  BP: 119/75 107/68 100/63 105/66  Pulse: 98 70 89 70  Temp: 99.3 F (37.4 C) 98.6 F (37 C) 98.5 F (36.9 C) 98.6 F (37 C)  TempSrc: Oral Oral Oral Oral  Resp: 18 16 18 18   Height:      Weight:      SpO2: 95% 95% 95% 99%    Intake/Output Summary (Last 24 hours) at 01/29/14 1624 Last data filed at 01/29/14 1400  Gross per 24 hour  Intake 1742.5 ml  Output   2400 ml  Net -657.5 ml    Exam: Gen:  NAD Cardiovascular:  RRR, No M/R/G Respiratory:  Lungs CTAB Gastrointestinal:  Abdomen soft, NT/ND, + BS Extremities:  No C/E/C   Data Reviewed:    Labs: Basic Metabolic Panel:  Recent Labs Lab 01/28/14 1740 01/29/14 0458  NA 139 140  K 4.0 4.3  CL 104 104  CO2  --  23  GLUCOSE 169* 146*  BUN 11 8  CREATININE 0.70 0.65  CALCIUM  --  9.0   GFR Estimated Creatinine Clearance: 64.9 ml/min (by C-G formula based on Cr of 0.65). Liver Function Tests:  Recent Labs Lab 01/28/14 1735  AST 13  ALT 7  ALKPHOS 74  BILITOT 0.4  PROT 7.4  ALBUMIN 3.9    Recent Labs Lab 01/28/14 1745  LIPASE  19  AMYLASE 21   Coagulation profile  Recent Labs Lab 01/28/14 1735  INR 1.04    CBC:  Recent Labs Lab 01/28/14 1735 01/28/14 1740 01/29/14 0458  WBC 6.1  --  11.3*  NEUTROABS 4.6  --   --   HGB 12.6 14.3 11.8*  HCT 39.0 42.0 36.5  MCV 87.2  --  87.3  PLT 193  --  194   Sepsis Labs:  Recent Labs Lab 01/28/14 1735 01/29/14 0458  WBC 6.1 11.3*   Microbiology No results found for this or any previous visit (from the past 240 hour(s)).   Radiographs/Studies:   Ct Abdomen  Pelvis W Contrast  01/28/2014   CLINICAL DATA:  Abdominal pain post colonoscopy today.  EXAM: CT ABDOMEN AND PELVIS WITH CONTRAST  TECHNIQUE: Multidetector CT imaging of the abdomen and pelvis was performed using the standard protocol following bolus administration of intravenous contrast.  CONTRAST:  59mL OMNIPAQUE IOHEXOL 300 MG/ML SOLN, 187mL OMNIPAQUE IOHEXOL 300 MG/ML SOLN  COMPARISON:  07/24/2008  FINDINGS: Lung bases are within normal. Findings likely representing prior hiatal hernia repair.  Abdominal images demonstrate surgical absence of the gallbladder. There is a 1.1 cm hypodensity over the lateral segment of the left lobe of the liver unchanged likely a small cyst or hemangioma. The spleen, pancreas and adrenal glands are within normal. There is a 1.1 cm simple cyst over the upper pole of the left kidney. Right kidney is within normal. There is no hydronephrosis or nephrolithiasis. Ureters are within normal. IVC filter is present below the level of the renal veins. There is mild calcified plaque over the abdominal aorta. There is mild diverticulosis of the colon. Appendix is difficult to visualize but small likely within normal. There are 2 linear metallic densities patient over the cecum with somewhat mass like wall thickening of the cecal tip. These metallic densities may be from recent surgical procedure.  There is subtle stranding of the mesenteric fat just posterior to the cecal tip adjacent the terminal ileum as the terminal ileum is otherwise within normal. There is no evidence of adenopathy. There is a very small umbilical hernia containing only mesenteric fat.  Pelvic images demonstrate surgical absence of uterus. There is a fleck of air over the nondependent portion of gallbladder which may be due to recent instrumentation. There is a mild L1 compression fracture unchanged from MRI lumbar spine 03/18/2013.  IMPRESSION: Masslike wall thickening involving the cecal tip with 2 associated linear  metallic densities suggesting prior surgical procedure in this region. Subtle stranding of the adjacent mesenteric fat although the adjacent terminal ileum and small caliber appendix are unremarkable. This finding may be seen due to focal colitis from infectious or inflammatory cause versus neoplasm. Recommend correlation with patient's recent colonoscopy findings.  Stable 1.1 cm liver hypodensity likely a small hemangioma.  Mild diverticulosis of the colon.  1.1 cm left renal cyst.  Small fleck of air within the nondependent portion of the bladder likely due to recent instrumentation, although can't be seen due to infection or enteric fistula.  Stable L1 compression fracture.   Electronically Signed   By: Marin Olp M.D.   On: 01/28/2014 19:38    Medications:    . amLODipine  5 mg Oral Daily   And  . benazepril  10 mg Oral Daily  . metoprolol succinate  25 mg Oral QHS  . nabumetone  750 mg Oral BID  . pantoprazole  40 mg Oral BID   Continuous Infusions: .  sodium chloride 75 mL/hr at 01/28/14 2150    Time spent: 25 minutes.   LOS: 1 day   [place]  [facility] Pager [phone]. If unable to reach me by pager, please call my cell phone at [phone].  *Please refer to [email or web address], password [email or web address] to get updated schedule on who will round on this patient, as hospitalists switch teams weekly. If 7PM-7AM, please contact night-coverage at [email or web address], password [email or web address] for any overnight needs.  01/29/2014, 4:24 PM    **Disclaimer: This note was dictated with voice recognition software. Similar sounding words can inadvertently be transcribed and this note may contain transcription errors which may not have been corrected upon publication of note.**

## 2014-01-29 NOTE — Progress Notes (Signed)
Pharmacy - Brief Note  This patient is receiving pantoprazole. Based on criteria approved by the Pharmacy and Therapeutics Committee, this medication is being converted to the equivalent oral dose form. These criteria include:   . The patient is eating (either orally or per tube) and/or has been taking other orally administered medications for at least 24 hours.  . This patient has no evidence of active gastrointestinal bleeding or impaired GI absorption (gastrectomy, short bowel, patient on TNA or NPO).   If you have questions about this conversion, please contact the pharmacy department.  [place], [facility] 01/29/2014 11:39 AM

## 2014-01-29 NOTE — Telephone Encounter (Signed)
Patient admitted under observation status at time of follow up call.

## 2014-01-30 DIAGNOSIS — D126 Benign neoplasm of colon, unspecified: Secondary | ICD-10-CM

## 2014-01-30 DIAGNOSIS — R197 Diarrhea, unspecified: Secondary | ICD-10-CM | POA: Diagnosis not present

## 2014-01-30 DIAGNOSIS — R109 Unspecified abdominal pain: Secondary | ICD-10-CM | POA: Diagnosis not present

## 2014-01-30 LAB — GI PATHOGEN PANEL BY PCR, STOOL
C difficile toxin A/B: NEGATIVE
Campylobacter by PCR: NEGATIVE
Cryptosporidium by PCR: NEGATIVE
E coli (ETEC) LT/ST: NEGATIVE
E coli (STEC): NEGATIVE
E coli 0157 by PCR: NEGATIVE
G lamblia by PCR: NEGATIVE
Norovirus GI/GII: NEGATIVE
Rotavirus A by PCR: NEGATIVE
Salmonella by PCR: NEGATIVE
Shigella by PCR: NEGATIVE

## 2014-01-30 LAB — CLOSTRIDIUM DIFFICILE BY PCR: Toxigenic C. Difficile by PCR: NEGATIVE

## 2014-01-30 NOTE — Progress Notes (Signed)
[facility] Progress Note  Subjective:  Says that she still has quite a bit of pain in the RLQ when she moves.  Had diarrhea with incontinence yesterday and stool studies were ordered.  Says that her BM's yesterday looked like there was some blood as well.  Objective:  Vital signs in last 24 hours: Temp:  [97.4 F (36.3 C)-98.6 F (37 C)] 97.4 F (36.3 C) (04/10 0555) Pulse Rate:  [66-77] 66 (04/10 0555) Resp:  [16-18] 16 (04/10 0555) BP: (105-115)/(66-78) 105/71 mmHg (04/10 0555) SpO2:  [96 %-99 %] 96 % (04/10 0555) Last BM Date: 01/29/14 General:  Alert, Well-developed, in NAD Heart:  Regular rate and rhythm; no murmurs Pulm:  CTAB.  No W/R/R. Abdomen:  Soft, non-distended. Normal bowel sounds.  RLQ TTP with some rebound tenderness.   Extremities:  Without edema. Neurologic:  Alert and  oriented x4;  grossly normal neurologically. Psych:  Alert and cooperative. Normal mood and affect.  Intake/Output from previous day: 04/09 0701 - 04/10 0700 In: 2296.3 [P.O.:720; I.V.:1526.3; IV Piggyback:50] Out: 2900 [Urine:2900] Intake/Output this shift: Total I/O In: -  Out: 800 [Urine:800]  Lab Results:  Recent Labs  01/28/14 1735 01/28/14 1740 01/29/14 0458  WBC 6.1  --  11.3*  HGB 12.6 14.3 11.8*  HCT 39.0 42.0 36.5  PLT 193  --  194   BMET  Recent Labs  01/28/14 1740 01/29/14 0458  NA 139 140  K 4.0 4.3  CL 104 104  CO2  --  23  GLUCOSE 169* 146*  BUN 11 8  CREATININE 0.70 0.65  CALCIUM  --  9.0   LFT  Recent Labs  01/28/14 1735  PROT 7.4  ALBUMIN 3.9  AST 13  ALT 7  ALKPHOS 74  BILITOT 0.4  BILIDIR <0.2  IBILI NOT CALCULATED   PT/INR  Recent Labs  01/28/14 1735  LABPROT 13.4  INR 1.04   Ct Abdomen Pelvis W Contrast  01/28/2014   CLINICAL DATA:  Abdominal pain post colonoscopy today.  EXAM: CT ABDOMEN AND PELVIS WITH CONTRAST  TECHNIQUE: Multidetector CT imaging of the abdomen and pelvis was performed using the standard protocol  following bolus administration of intravenous contrast.  CONTRAST:  64mL OMNIPAQUE IOHEXOL 300 MG/ML SOLN, 134mL OMNIPAQUE IOHEXOL 300 MG/ML SOLN  COMPARISON:  07/24/2008  FINDINGS: Lung bases are within normal. Findings likely representing prior hiatal hernia repair.  Abdominal images demonstrate surgical absence of the gallbladder. There is a 1.1 cm hypodensity over the lateral segment of the left lobe of the liver unchanged likely a small cyst or hemangioma. The spleen, pancreas and adrenal glands are within normal. There is a 1.1 cm simple cyst over the upper pole of the left kidney. Right kidney is within normal. There is no hydronephrosis or nephrolithiasis. Ureters are within normal. IVC filter is present below the level of the renal veins. There is mild calcified plaque over the abdominal aorta. There is mild diverticulosis of the colon. Appendix is difficult to visualize but small likely within normal. There are 2 linear metallic densities patient over the cecum with somewhat mass like wall thickening of the cecal tip. These metallic densities may be from recent surgical procedure.  There is subtle stranding of the mesenteric fat just posterior to the cecal tip adjacent the terminal ileum as the terminal ileum is otherwise within normal. There is no evidence of adenopathy. There is a very small umbilical hernia containing only mesenteric fat.  Pelvic images demonstrate surgical absence of uterus.  There is a fleck of air over the nondependent portion of gallbladder which may be due to recent instrumentation. There is a mild L1 compression fracture unchanged from MRI lumbar spine 03/18/2013.  IMPRESSION: Masslike wall thickening involving the cecal tip with 2 associated linear metallic densities suggesting prior surgical procedure in this region. Subtle stranding of the adjacent mesenteric fat although the adjacent terminal ileum and small caliber appendix are unremarkable. This finding may be seen due to  focal colitis from infectious or inflammatory cause versus neoplasm. Recommend correlation with patient's recent colonoscopy findings.  Stable 1.1 cm liver hypodensity likely a small hemangioma.  Mild diverticulosis of the colon.  1.1 cm left renal cyst.  Small fleck of air within the nondependent portion of the bladder likely due to recent instrumentation, although can't be seen due to infection or enteric fistula.  Stable L1 compression fracture.   Electronically Signed   By: Marin Olp M.D.   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 CT negative for perforation. Still has pain today, especially with movement.  Had diarrhea last night and Cdiff/GI pathogen panel was ordered.   -Await stool studies. -I think that she would benefit from one more day of hospital stay and observation. -Will change diet to regular diet with soft foods.  Patient is aware of what type of things to eat. -If stool studies negative then she will not need to be discharged on antibiotics.    LOS: 2 days   Laban Emperor. Zehr  01/30/2014, 10:24 AM  GI Attending Note  I have personally taken an interval history, reviewed the chart, and examined the patient.  I agree with the extender's note, impression and recommendations.  Although she still c/o pain, it is decreasing and overall she feels better.  Should this continue she can be d/ced in am.  Sandy Salaam. Deatra Ina, MD, [facility] [phone]   Pager number [phone]

## 2014-01-30 NOTE — Progress Notes (Signed)
Progress Note   [name] OAC:[id number] DOB: 1935-10-02 DOA: 01/28/2014 PCP: [name], MD   Brief Narrative:    [name] is an 79 y.o. female with a PMH of hypertension, hyperlipidemia, and GERD who was sent by Dr. Olevia Perches to the ER for abdominal pain after performing a colonoscopy 01/28/14. CT scan of the abdomen and pelvis showed some subtle abnormalities but focal colitis or neoplasm could not be completely ruled out, and was admitted and placed on IV antibiotics.  Assessment/Plan:    Principal Problem:   Abdominal pain after colonoscopy, rule out acute colitis, post polypectomy syndrome Patient was admitted and CT scan of the abdomen was done to rule out post-colonoscopy perforation or polypectomy burn. Seen by Dr. Olevia Perches today who felt that the patient's clinical presentation and CT findings are more consistent with a post polypectomy syndrome. Recommended advancement of diet and discontinuation of antibiotics. The patient initially felt better and was anticipated to discharge home this afternoon, but subsequently developed nausea, worsening abdominal pain, and was incontinent of diarrhea stools so her discharge has been placed on hold. GI pathogen panel has been requested. C. difficile PCR negative. Still having a fair amount of abdominal pain. Active Problems:   DIABETES MELLITUS, TYPE II Patient does not appear to be on routine medications for diabetes. Likely diet controlled. Hemoglobin A1c 6.3% on 01/16/14. Maintain carbohydrate modified diet.   HYPERTENSION Continue metoprolol and amlodipine/benazepril. Blood pressure controlled. Continue PPI therapy.   DVT Prophylaxis Continue SCDs.  Code Status: Full. Family Communication: No family currently at the bedside. Disposition Plan: Home when stable.   IV access:  Peripheral IV  Procedures  None.  Medical Consultants:  Dr. Delfin Edis, Gastroenterology.  Other  Consultants:  None.  Anti-infectives:  Zosyn 01/28/14---> 01/29/14.  Subjective:   [name] has not had any further loose stools since yesterday. No frank nausea or vomiting, but continues to have abdominal pain in the RLQ.  Objective:    Filed Vitals:   01/29/14 0508 01/29/14 1400 01/29/14 2151 01/30/14 0555  BP: 100/63 105/66 115/78 105/71  Pulse: 89 70 77 66  Temp: 98.5 F (36.9 C) 98.6 F (37 C) 97.6 F (36.4 C) 97.4 F (36.3 C)  TempSrc: Oral Oral Axillary Oral  Resp: 18 18 16 16   Height:      Weight:      SpO2: 95% 99% 99% 96%    Intake/Output Summary (Last 24 hours) at 01/30/14 0824 Last data filed at 01/30/14 0543  Gross per 24 hour  Intake 2296.25 ml  Output   2900 ml  Net -603.75 ml    Exam: Gen:  NAD Cardiovascular:  RRR, No M/R/G Respiratory:  Lungs CTAB Gastrointestinal:  Abdomen soft, NT/ND, + BS Extremities:  No C/E/C   Data Reviewed:    Labs: Basic Metabolic Panel:  Recent Labs Lab 01/28/14 1740 01/29/14 0458  NA 139 140  K 4.0 4.3  CL 104 104  CO2  --  23  GLUCOSE 169* 146*  BUN 11 8  CREATININE 0.70 0.65  CALCIUM  --  9.0   GFR Estimated Creatinine Clearance: 64.9 ml/min (by C-G formula based on Cr of 0.65). Liver Function Tests:  Recent Labs Lab 01/28/14 1735  AST 13  ALT 7  ALKPHOS 74  BILITOT 0.4  PROT 7.4  ALBUMIN 3.9    Recent Labs Lab 01/28/14 1745  LIPASE 19  AMYLASE 21   Coagulation profile  Recent Labs Lab 01/28/14  1735  INR 1.04    CBC:  Recent Labs Lab 01/28/14 1735 01/28/14 1740 01/29/14 0458  WBC 6.1  --  11.3*  NEUTROABS 4.6  --   --   HGB 12.6 14.3 11.8*  HCT 39.0 42.0 36.5  MCV 87.2  --  87.3  PLT 193  --  194   Sepsis Labs:  Recent Labs Lab 01/28/14 1735 01/29/14 0458  WBC 6.1 11.3*   Microbiology No results found for this or any previous visit (from the past 240 hour(s)).   Radiographs/Studies:   Ct Abdomen Pelvis W Contrast  01/28/2014   CLINICAL DATA:   Abdominal pain post colonoscopy today.  EXAM: CT ABDOMEN AND PELVIS WITH CONTRAST  TECHNIQUE: Multidetector CT imaging of the abdomen and pelvis was performed using the standard protocol following bolus administration of intravenous contrast.  CONTRAST:  44mL OMNIPAQUE IOHEXOL 300 MG/ML SOLN, 18mL OMNIPAQUE IOHEXOL 300 MG/ML SOLN  COMPARISON:  07/24/2008  FINDINGS: Lung bases are within normal. Findings likely representing prior hiatal hernia repair.  Abdominal images demonstrate surgical absence of the gallbladder. There is a 1.1 cm hypodensity over the lateral segment of the left lobe of the liver unchanged likely a small cyst or hemangioma. The spleen, pancreas and adrenal glands are within normal. There is a 1.1 cm simple cyst over the upper pole of the left kidney. Right kidney is within normal. There is no hydronephrosis or nephrolithiasis. Ureters are within normal. IVC filter is present below the level of the renal veins. There is mild calcified plaque over the abdominal aorta. There is mild diverticulosis of the colon. Appendix is difficult to visualize but small likely within normal. There are 2 linear metallic densities patient over the cecum with somewhat mass like wall thickening of the cecal tip. These metallic densities may be from recent surgical procedure.  There is subtle stranding of the mesenteric fat just posterior to the cecal tip adjacent the terminal ileum as the terminal ileum is otherwise within normal. There is no evidence of adenopathy. There is a very small umbilical hernia containing only mesenteric fat.  Pelvic images demonstrate surgical absence of uterus. There is a fleck of air over the nondependent portion of gallbladder which may be due to recent instrumentation. There is a mild L1 compression fracture unchanged from MRI lumbar spine 03/18/2013.  IMPRESSION: Masslike wall thickening involving the cecal tip with 2 associated linear metallic densities suggesting prior surgical  procedure in this region. Subtle stranding of the adjacent mesenteric fat although the adjacent terminal ileum and small caliber appendix are unremarkable. This finding may be seen due to focal colitis from infectious or inflammatory cause versus neoplasm. Recommend correlation with patient's recent colonoscopy findings.  Stable 1.1 cm liver hypodensity likely a small hemangioma.  Mild diverticulosis of the colon.  1.1 cm left renal cyst.  Small fleck of air within the nondependent portion of the bladder likely due to recent instrumentation, although can't be seen due to infection or enteric fistula.  Stable L1 compression fracture.   Electronically Signed   By: Marin Olp M.D.   On: 01/28/2014 19:38    Medications:    . amLODipine  5 mg Oral Daily   And  . benazepril  10 mg Oral Daily  . metoprolol succinate  25 mg Oral QHS  . nabumetone  750 mg Oral BID  . pantoprazole  40 mg Oral BID   Continuous Infusions: . sodium chloride Stopped (01/30/14 0133)    Time spent: 25 minutes.  LOS: 2 days   [place]  [facility] Pager [phone]. If unable to reach me by pager, please call my cell phone at [phone].  *Please refer to [email or web address], password [email or web address] to get updated schedule on who will round on this patient, as hospitalists switch teams weekly. If 7PM-7AM, please contact night-coverage at [email or web address], password [email or web address] for any overnight needs.  01/30/2014, 8:24 AM    **Disclaimer: This note was dictated with voice recognition software. Similar sounding words can inadvertently be transcribed and this note may contain transcription errors which may not have been corrected upon publication of note.**

## 2014-01-31 DIAGNOSIS — E119 Type 2 diabetes mellitus without complications: Secondary | ICD-10-CM | POA: Diagnosis not present

## 2014-01-31 DIAGNOSIS — I10 Essential (primary) hypertension: Secondary | ICD-10-CM | POA: Diagnosis not present

## 2014-01-31 DIAGNOSIS — R109 Unspecified abdominal pain: Secondary | ICD-10-CM | POA: Diagnosis not present

## 2014-01-31 DIAGNOSIS — D099 Carcinoma in situ, unspecified: Secondary | ICD-10-CM | POA: Diagnosis not present

## 2014-01-31 NOTE — Discharge Summary (Signed)
Physician Discharge Summary  PAMLEA FINDER PPI:[id number] DOB: October 21, 1935 DOA: 01/28/2014  PCP: [name], MD  Admit date: 01/28/2014 Discharge date: 01/31/2014   Recommendations for Outpatient Follow-Up:   1. Close followup with gastroenterologist recommended to followup on final biopsy results and to arrange outpatient surgical consultation. 2. Followup GI pathogen panel.   Discharge Diagnosis:   Principal Problem:    Abdominal pain thought to be secondary to post polypectomy syndrome Active Problems:    DIABETES MELLITUS, TYPE II    HYPERTENSION    HIATAL HERNIA    Diarrhea  Discharge Condition: Improved.  Diet recommendation: Low sodium, heart healthy.     History of Present Illness:   [name] is an 79 y.o. female with a PMH of hypertension, hyperlipidemia, and GERD who was sent by Dr. Olevia Perches to the ER for abdominal pain after performing a colonoscopy 01/28/14. CT scan of the abdomen and pelvis showed some subtle abnormalities but focal colitis or neoplasm could not be completely ruled out, and was admitted and placed on IV antibiotics.   Hospital Course by Problem:   Principal Problem:  Abdominal pain after colonoscopy, rule out acute colitis, post polypectomy syndrome  Patient was admitted and CT scan of the abdomen was done to rule out post-colonoscopy perforation or polypectomy burn. Seen by Dr. Olevia Perches today who felt that the patient's clinical presentation and CT findings are more consistent with a post polypectomy syndrome. Recommended advancement of diet and discontinuation of antibiotics. The patient initially felt better and was anticipated to discharge home 01/30/14, but subsequently developed nausea, worsening abdominal pain, and was incontinent of diarrhea stools so her discharge was placed on hold. GI pathogen panel pending, C. difficile PCR negative. Patient has not had any further diarrhea and her pain is mild and she is therefore stable to discharge home  today. Active Problems:  DIABETES MELLITUS, TYPE II  Patient does not appear to be on routine medications for diabetes. Likely diet controlled. Hemoglobin A1c 6.3% on 01/16/14. Maintain carbohydrate modified diet.  HYPERTENSION  Continue metoprolol and amlodipine/benazepril. Blood pressure controlled.  Procedures:    None.   Medical Consultants:    Dr. Delfin Edis, Gastroenterology.   Discharge Exam:   Filed Vitals:   01/31/14 1027  BP: 129/71  Pulse:   Temp:   Resp:    Filed Vitals:   01/30/14 1428 01/30/14 2038 01/31/14 0543 01/31/14 1027  BP: 110/77 125/79 130/75 129/71  Pulse: 85 72 65   Temp: 97.7 F (36.5 C) 98.4 F (36.9 C) 98 F (36.7 C)   TempSrc: Oral Oral Oral   Resp: 18 16 18    Height:      Weight:      SpO2: 99% 96% 95%     Gen:  NAD Cardiovascular:  RRR, No M/R/G Respiratory: Lungs CTAB Gastrointestinal: Abdomen soft, mildly tender, normal active bowel sounds. Extremities: No C/E/C    Discharge Instructions:    Discharge Orders   Future Appointments Provider Department Dept Phone   03/05/2014 11:45 AM Dorothy Spark, MD [place] Office [phone]   05/19/2014 10:30 AM Colon Branch, MD [place] at  [place]   Future Orders Complete By Expires   Call MD for:  persistant nausea and vomiting  As directed    Call MD for:  persistant nausea and vomiting  As directed    Call MD for:  severe uncontrolled pain  As directed    Call MD for:  severe  uncontrolled pain  As directed    Call MD for:  temperature >100.4  As directed    Call MD for:  temperature >100.4  As directed    Diet - low sodium heart healthy  As directed    Discharge instructions  As directed    Discharge instructions  As directed    Increase activity slowly  As directed    Increase activity slowly  As directed        Medication List         acetaminophen 500 MG tablet  Commonly known as:  TYLENOL  Take 500 mg by mouth  daily as needed for mild pain or moderate pain.     Alpha-Lipoic Acid 600 MG Caps  Take 600 mg by mouth 2 (two) times daily.     amLODipine-benazepril 5-10 MG per capsule  Commonly known as:  LOTREL  take 1 capsule by mouth once daily     aspirin EC 81 MG tablet  Take 1 tablet (81 mg total) by mouth daily.     Boron 3 MG Caps  Take 3 mg by mouth every evening.     diclofenac sodium 1 % Gel  Commonly known as:  VOLTAREN  Apply 1 application topically daily as needed (pain).     hydrochlorothiazide 12.5 MG capsule  Commonly known as:  MICROZIDE  Take 12.5 mg by mouth daily.     HYDROcodone-acetaminophen 10-325 MG per tablet  Commonly known as:  NORCO  Take 1 tablet by mouth every 6 (six) hours as needed for moderate pain or severe pain.     hydrocortisone 25 MG suppository  Commonly known as:  ANUSOL-HC  Place 25 mg rectally 2 (two) times daily as needed for hemorrhoids.     hyoscyamine 0.125 MG SL tablet  Commonly known as:  LEVSIN SL  Place 1 tablet (0.125 mg total) under the tongue every 4 (four) hours as needed for cramping.     Krill Oil 300 MG Caps  Take 300 mg by mouth every evening.     meclizine 12.5 MG tablet  Commonly known as:  ANTIVERT  Take 12.5 mg by mouth 3 (three) times daily as needed for dizziness.     metoprolol succinate 25 MG 24 hr tablet  Commonly known as:  TOPROL-XL  Take 1 tablet (25 mg total) by mouth daily.     nabumetone 750 MG tablet  Commonly known as:  RELAFEN  Take 750 mg by mouth 2 (two) times daily.     omeprazole 20 MG capsule  Commonly known as:  PRILOSEC  Take 20 mg by mouth 2 (two) times daily.     OVER THE COUNTER MEDICATION  Take 400 mg by mouth 2 (two) times daily. tumeric     OVER THE COUNTER MEDICATION  Take 1 capsule by mouth 2 (two) times daily. Integrative Digestive Formula     PRESERVISION AREDS 2 PO  Take 2 tablets by mouth daily.     PROBIOTIC DAILY PO  Take 1 capsule by mouth daily.     REFRESH OP    Place 1 drop into both eyes daily as needed (dry eyes).     Vitamin B-12 2500 MCG Subl  Place 2,500 mcg under the tongue daily.     Vitamin D3 5000 UNITS Caps  Take 5,000 Units by mouth every evening.           Follow-up Information   Schedule an appointment as soon as possible for a  visit with [name], MD. (If symptoms worsen)    Specialty:  Internal Medicine   Contact information:   [phone] [facility] [place] [address] [phone]       Follow up with Delfin Edis, MD. Schedule an appointment as soon as possible for a visit in 1 week. (Biopsy results.)    Specialty:  Gastroenterology   Contact information:   [address] [place] [address] [phone]       Schedule an appointment as soon as possible for a visit to follow up.       The results of significant diagnostics from this hospitalization (including imaging, microbiology, ancillary and laboratory) are listed below for reference.     Significant Diagnostic Studies:   Radiographs: Ct Chest Wo Contrast  01/08/2014   CLINICAL DATA:  Followup pulmonary nodule  EXAM: CT CHEST WITHOUT CONTRAST  TECHNIQUE: Multidetector CT imaging of the chest was performed following the standard protocol without IV contrast.  COMPARISON:  08/27/2009  FINDINGS: No pleural effusions identified. No airspace consolidation or atelectasis. Right middle nodule is unchanged from previous exam measuring 1.2 x 0.8 cm, image 27/series 3. Scar is identified within the right apex. No new or enlarging pulmonary nodules or mass identified.  The heart size is normal. No pericardial effusion. Calcified atherosclerotic disease involves the aortic arch. Calcifications within the LAD coronary artery also noted. No enlarged mediastinal or hilar lymph nodes identified. There is no axillary or supraclavicular adenopathy.  Incidental imaging through the upper abdomen shows a stable subcapsular hypodensity within the left  hepatic lobe measuring 9 mm, image 51/series 2. The patient is status post cholecystectomy.  Review of the visualized osseous structures is significant for mild thoracic spondylosis. There are no aggressive lytic or sclerotic bone lesions identified.  IMPRESSION: 1. No acute cardiopulmonary abnormalities 2. Pulmonary nodule in the right lung is unchanged from previous exam and most likely benign.   Electronically Signed   By: Kerby Moors M.D.   On: 01/08/2014 12:30   Ct Abdomen Pelvis W Contrast  01/28/2014   CLINICAL DATA:  Abdominal pain post colonoscopy today.  EXAM: CT ABDOMEN AND PELVIS WITH CONTRAST  TECHNIQUE: Multidetector CT imaging of the abdomen and pelvis was performed using the standard protocol following bolus administration of intravenous contrast.  CONTRAST:  49mL OMNIPAQUE IOHEXOL 300 MG/ML SOLN, 112mL OMNIPAQUE IOHEXOL 300 MG/ML SOLN  COMPARISON:  07/24/2008  FINDINGS: Lung bases are within normal. Findings likely representing prior hiatal hernia repair.  Abdominal images demonstrate surgical absence of the gallbladder. There is a 1.1 cm hypodensity over the lateral segment of the left lobe of the liver unchanged likely a small cyst or hemangioma. The spleen, pancreas and adrenal glands are within normal. There is a 1.1 cm simple cyst over the upper pole of the left kidney. Right kidney is within normal. There is no hydronephrosis or nephrolithiasis. Ureters are within normal. IVC filter is present below the level of the renal veins. There is mild calcified plaque over the abdominal aorta. There is mild diverticulosis of the colon. Appendix is difficult to visualize but small likely within normal. There are 2 linear metallic densities patient over the cecum with somewhat mass like wall thickening of the cecal tip. These metallic densities may be from recent surgical procedure.  There is subtle stranding of the mesenteric fat just posterior to the cecal tip adjacent the terminal ileum as the  terminal ileum is otherwise within normal. There is no  evidence of adenopathy. There is a very small umbilical hernia containing only mesenteric fat.  Pelvic images demonstrate surgical absence of uterus. There is a fleck of air over the nondependent portion of gallbladder which may be due to recent instrumentation. There is a mild L1 compression fracture unchanged from MRI lumbar spine 03/18/2013.  IMPRESSION: Masslike wall thickening involving the cecal tip with 2 associated linear metallic densities suggesting prior surgical procedure in this region. Subtle stranding of the adjacent mesenteric fat although the adjacent terminal ileum and small caliber appendix are unremarkable. This finding may be seen due to focal colitis from infectious or inflammatory cause versus neoplasm. Recommend correlation with patient's recent colonoscopy findings.  Stable 1.1 cm liver hypodensity likely a small hemangioma.  Mild diverticulosis of the colon.  1.1 cm left renal cyst.  Small fleck of air within the nondependent portion of the bladder likely due to recent instrumentation, although can't be seen due to infection or enteric fistula.  Stable L1 compression fracture.   Electronically Signed   By: Marin Olp M.D.   On: 01/28/2014 19:38    Labs:  Basic Metabolic Panel:  Recent Labs Lab 01/28/14 1740 01/29/14 0458  NA 139 140  K 4.0 4.3  CL 104 104  CO2  --  23  GLUCOSE 169* 146*  BUN 11 8  CREATININE 0.70 0.65  CALCIUM  --  9.0   GFR Estimated Creatinine Clearance: 64.9 ml/min (by C-G formula based on Cr of 0.65). Liver Function Tests:  Recent Labs Lab 01/28/14 1735  AST 13  ALT 7  ALKPHOS 74  BILITOT 0.4  PROT 7.4  ALBUMIN 3.9    Recent Labs Lab 01/28/14 1745  LIPASE 19  AMYLASE 21   Coagulation profile  Recent Labs Lab 01/28/14 1735  INR 1.04    CBC:  Recent Labs Lab 01/28/14 1735 01/28/14 1740 01/29/14 0458  WBC 6.1  --  11.3*  NEUTROABS 4.6  --   --   HGB 12.6 14.3  11.8*  HCT 39.0 42.0 36.5  MCV 87.2  --  87.3  PLT 193  --  194   Microbiology Recent Results (from the past 240 hour(s))  CLOSTRIDIUM DIFFICILE BY PCR     Status: None   Collection Time    01/29/14  9:53 PM      Result Value Ref Range Status   C difficile by pcr NEGATIVE  NEGATIVE Final   Comment: Performed at [facility]    Time coordinating discharge: 25 minutes.  SignedVenetia Maxon Sumiya Mamaril  Pager [phone] [facility] 01/31/2014, 2:30 PM

## 2014-01-31 NOTE — Progress Notes (Signed)
          Daily Rounding Note  01/31/2014, 11:25 AM  LOS: 3 days   SUBJECTIVE:      Abdominal pain improved.  No nausea, no bleeding per rectum.  Tolerating solids.  No diarrhea today  OBJECTIVE:         Vital signs in last 24 hours:    Temp:  [97.7 F (36.5 C)-98.4 F (36.9 C)] 98 F (36.7 C) (04/11 0543) Pulse Rate:  [65-85] 65 (04/11 0543) Resp:  [16-18] 18 (04/11 0543) BP: (110-130)/(71-79) 129/71 mmHg (04/11 1027) SpO2:  [95 %-99 %] 95 % (04/11 0543) Last BM Date: 01/29/14 General: looks well and comfortable   Heart: RRR Chest: clear bil Abdomen: soft, ND, active BS, minor tenderness, lower abdomen.  Extremities: no CCE Neuro/Psych:  Pleasant , no deficits, no confusion.   Intake/Output from previous day: 04/10 0701 - 04/11 0700 In: 1105 [P.O.:980; I.V.:125] Out: 3700 [Urine:3700]  Intake/Output this shift: Total I/O In: -  Out: 950 [Urine:950]  Lab Results:  Recent Labs  01/28/14 1735 01/28/14 1740 01/29/14 0458  WBC 6.1  --  11.3*  HGB 12.6 14.3 11.8*  HCT 39.0 42.0 36.5  PLT 193  --  194   BMET  Recent Labs  01/28/14 1740 01/29/14 0458  NA 139 140  K 4.0 4.3  CL 104 104  CO2  --  23  GLUCOSE 169* 146*  BUN 11 8  CREATININE 0.70 0.65  CALCIUM  --  9.0     ASSESMENT:   *  Post colonoscopy (4/8) abdominal pain. Imaging negative for perforation.  Diarrhea, stool pathogen panel negative.   *  Cecal colon polyp.  Will need surgical resection. Pathology still pending. 2004 had polyp with carcinoma in situ, since then has had recurrent adenomatous polyps.    PLAN   *  Ok to discharge today.  Dr Linus Mako office will arrange outpt surgical consult. Sent in Basket message to her and to RN as reminder to arrange.     Vena Rua  01/31/2014, 11:25 AM Pager: [phone]  GI Attending Note  I have personally taken an interval history, reviewed the chart, and examined the patient.  I agree  with the extender's note, impression and recommendations.  Sandy Salaam. Deatra Ina, MD, [facility] [phone]

## 2014-01-31 NOTE — Progress Notes (Signed)
Assessment unchanged. Pt verbalized understanding of dc instructions through teach back. No scripts at dc. Introduced to My Chart and access code highlighted. Pt plans to sign up soon. Discharged via wc to front entrance to meet awaiting vehicle to carry home. Accompanied by NT.

## 2014-02-02 ENCOUNTER — Telehealth: Payer: Self-pay | Admitting: *Deleted

## 2014-02-02 NOTE — Telephone Encounter (Signed)
Left a message for patient to call me. Scheduled OV on 02/10/14 at 2:30 PM with Dr. Olevia Perches.

## 2014-02-02 NOTE — Telephone Encounter (Signed)
Message copied by Hulan Saas on Mon Feb 02, 2014  9:57 AM ------      Message from: Vena Rua      Created: Sat Jan 31, 2014 11:37 AM       Sydell Axon and Rollene Fare: just a reminder to set up surgical eval appt for Mrs Bambrick regarding the cecal polyp.      She is discharging from hospital 4/11.       Sarah ------

## 2014-02-02 NOTE — Telephone Encounter (Signed)
Message copied by Hulan Saas on Mon Feb 02, 2014  9:57 AM ------      Message from: Lafayette Dragon      Created: Sat Jan 31, 2014  7:46 PM       Rollene Fare, i will want to see her in the office first before  deciding about surgery.      ----- Message -----         From: Vena Rua, PA-C         Sent: 01/31/2014  11:37 AM           To: Lafayette Dragon, MD, Hulan Saas, RN            Sydell Axon and Rollene Fare: just a reminder to set up surgical eval appt for Mrs Weisheit regarding the cecal polyp.      She is discharging from hospital 4/11.       Sarah       ------

## 2014-02-02 NOTE — Telephone Encounter (Signed)
Patient notified of appointment date and time. 

## 2014-02-03 ENCOUNTER — Encounter: Payer: Self-pay | Admitting: Internal Medicine

## 2014-02-05 ENCOUNTER — Encounter: Payer: Self-pay | Admitting: Internal Medicine

## 2014-02-05 DIAGNOSIS — N3020 Other chronic cystitis without hematuria: Secondary | ICD-10-CM | POA: Diagnosis not present

## 2014-02-09 ENCOUNTER — Encounter: Payer: Self-pay | Admitting: *Deleted

## 2014-02-10 ENCOUNTER — Ambulatory Visit (INDEPENDENT_AMBULATORY_CARE_PROVIDER_SITE_OTHER): Payer: Medicare Other | Admitting: Internal Medicine

## 2014-02-10 ENCOUNTER — Encounter: Payer: Self-pay | Admitting: Internal Medicine

## 2014-02-10 VITALS — BP 104/64 | HR 76 | Ht 63.0 in | Wt 199.0 lb

## 2014-02-10 DIAGNOSIS — D126 Benign neoplasm of colon, unspecified: Secondary | ICD-10-CM | POA: Diagnosis not present

## 2014-02-10 DIAGNOSIS — Z9889 Other specified postprocedural states: Secondary | ICD-10-CM | POA: Diagnosis not present

## 2014-02-10 DIAGNOSIS — I2510 Atherosclerotic heart disease of native coronary artery without angina pectoris: Secondary | ICD-10-CM

## 2014-02-10 NOTE — Patient Instructions (Signed)
Please follow up with Dr Olevia Perches in 6 months.  CC:Dr Larose Kells

## 2014-02-10 NOTE — Progress Notes (Signed)
[name] September 05, 1935 [id number]  Note: This dictation was prepared with Dragon digital system. Any transcriptional errors that result from this procedure are unintentional.   History of Present Illness:  This is a 79 year old white female who is post hospitalization for abdominal pain. Following colonoscopy. She underwent a colonoscopy and cecal polypectomy of a large carpeted polyp on 01/28/2014 and developed RLQ pain and was admitted for observation. Her WBS was normal The pain subsided the same night. A CT scan of the abdomen showed edema of the cecum at the post polypectomy site but no perforation.  The pathology of the polyp showed a tubular adenoma. There was no dysplasia. She has a history of multiple colon polyps and carcinoma in situ in a sigmoid polyp of about 20 years ago. She is here to discuss options as far as the cecal polyp is concerned.. The polyp was about 90 % removed but  because of the sessile nature of it not be entirely  shaved off. Trying to remove any more of it could have  have resulted in perforation. 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 Patient was diagnosed with coronary artery disease, reflex sympathetic dystrophy. She has been overweight. She has been scheduled for an orthopedic surgery on the lumbar spine in May 2015 and had several orthopedic procedures last year.    Past Medical History  Diagnosis Date  . Hyperlipidemia     a. patient unwilling to use statins.  . Osteoporosis   . DVT (deep venous thrombosis) 12/2007  . PE (pulmonary embolism) 12/2007    a. PE/DVT after neck surgery 2009. b. coumadin d/c 10-2008.  . Pulmonary nodule     incidental per CT:  Pet scan 4-9: likely benign, CT 08-2009 no change, no further CTs (Dr. Gwenette Greet)  . Hemorrhoids   . IBS (irritable bowel syndrome)   . Diverticulosis   . PPD positive   . Macular  degeneration 03/2009    Dr. Rosana Hoes  . Allergic rhinitis   . Dyspnea     a. Chronic, extensive w/u see OV note 09-2010. b. [place] 2011: ormal with RA 5 RV 31/2 PA 27/12 (19) PCW 10 CO normal. No evidence of shunting with sitting up in cath lab. c. CPX 2011: see report. d. Prior fluoro of diaphragm -  R diaphragm elevated at rest but both moved with inspiration.   Marland Kitchen Spinal stenosis   . GERD (gastroesophageal reflux disease)     a. Hx GERD/esophageal dysmotility followed by Dr. Olevia Perches.   Marland Kitchen Hypertension   . Neuropathy     a. Hands, feet, legs.  . Orthostatic hypotension   . CAD (coronary artery disease)     a. Nonobst in 2011. b. Abnormal nuc 09/2013 -> s/p cutting balloon to D2; mild LAD disease 10/31/13.  . Osteomyelitis     a. Adm 04/2013: Charcot collapse of the right foot with osteomyelitis and ulceration, s/p excision.  . Venous insufficiency     a. Contributing to LEE.  . Type II diabetes mellitus   . DJD (degenerative joint disease)   . Arthritis     "back; ankles; hands; knees" (10/31/2013)  . Recurrent UTI   . 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 1994 - malignant polyp removed during colonoscopy.  . Hiatal hernia     a. s/p Nissen fundoplication 4098.  Marland Kitchen RSD (reflex sympathetic dystrophy)  a. Chronic pain.  . Abnormal echocardiogram     09/2013: mild LVH, mild focal basal hypertrophy of septum, EF 60-65%, normal WM, grade 1 diastolic dysfunction, MAC, mild LAE, ASA, PASP 34. Possible oscillating MV density - reviewed by MD and felt there was no significant abnormality other than MAC noted with mitral valve and did not require TEE.  . Adenomatous colon polyp     Past Surgical History  Procedure Laterality Date  . Nissen fundoplication  12/23/2949  . Cholecystectomy    . Cataract extraction w/ intraocular lens  implant, bilateral Bilateral ~ 2003  . Rotator cuff repair  07/2007    Dr. Percell Miller  . Anterior cervical decomp/discectomy fusion  12/18/07    For OA,  Dr. Lorin Mercy:  fu by a  PE  . Toe amputation Right 08/2008    3rd toe, Dr. Sharol Given due to osteomyelitis  . Nasal septum surgery    . Vein ligation Bilateral 1967  . Carpal tunnel release Left 09/2011  . Amputation  03/06/2012    Procedure: AMPUTATION FOOT;  Surgeon: Newt Minion, MD;  Location: [facility];  Service: Orthopedics;  Laterality: Left;  FIFTH RAY AMPUTATION   . Vena cava filter placement  01/2010    green filter; "due to blood clots"  . Ankle fusion  09/27/2012    Procedure: ANKLE FUSION;  Surgeon: Newt Minion, MD;  Location: [facility];  Service: Orthopedics;  Laterality: Left;  Left Tibiocalcaneal Fusion  . Ankle fusion Right 05/09/2013    Procedure: ANKLE FUSION;  Surgeon: Newt Minion, MD;  Location: [place];  Service: Orthopedics;  Laterality: Right;  Excision Osteomyelitis Base 1st MT Right Foot, Fusion Medial Column  . Cardiac catheterization  05/2010     at [facility]  . Coronary angioplasty  10/31/2013  . Vaginal hysterectomy  ~ 1976    Allergies  Allergen Reactions  . Cymbalta [Duloxetine Hcl] Swelling    Swelling in legs  . Gabapentin Swelling    Swelling in legs  . Adhesive [Tape]     rash  . Cefazolin Hives  . Ciprofloxacin Other (See Comments)    Per pt, caused body aches   . Clorazepate Dipotassium Other (See Comments)    Unknown reaction  . Darifenacin Hydrobromide Other (See Comments)    hypotension, near syncope  . Dilaudid [Hydromorphone Hcl] Other (See Comments)     confused, intense itching  . Enablex [Darifenacin Hydrobromide Er] Other (See Comments)    Hypotension, near syncope  . Levofloxacin     Causes wrist pain  . Lyrica [Pregabalin] Swelling    Swelling in legs  . Methadone Hcl Other (See Comments)    Reaction unknown  . Morphine And Related Other (See Comments)    Confusion   . Pentazocine Lactate Other (See Comments)    Altered mental state, "climbing walls", anxiety  . Talwin [Pentazocine] Other (See Comments)    "climbing walls" anxiety  . Doxycycline Rash     Family history and social history have been reviewed.  Review of Systems: Denies abdominal pain diarrhea or rectal bleeding  The remainder of the 10 point ROS is negative except as outlined in the H&P  Physical Exam: General Appearance Well developed, in no distress Psychological Normal mood and affect  Assessment and Plan:   Problem #1 Carpeted cecal polyp which is a tubular adenoma without high grade dysplasia. It was not completely removed. She had an option of either laparoscopic assisted resection of the cecum or  observation. I am concerned that recall colonoscopies may result in complications such as what occurred after her last colonoscopy  She has multiple medical problems and is not a good candidate for general anesthesia. At this point, she is going to go through several other operations on her back and feet. I will see her in 6 months and we will at that time decide whether to repeat her colonoscopy  intervals or whether she should go for laparoscopically assisted surgical resection    Lafayette Dragon 02/10/2014

## 2014-02-12 ENCOUNTER — Telehealth: Payer: Self-pay

## 2014-02-12 NOTE — Telephone Encounter (Signed)
Relevant patient education mailed to patient.  

## 2014-02-17 ENCOUNTER — Telehealth: Payer: Self-pay | Admitting: Internal Medicine

## 2014-02-17 MED ORDER — HYDROCODONE-ACETAMINOPHEN 10-325 MG PO TABS: 1.0000 | ORAL_TABLET | Freq: Four times a day (QID) | ORAL | Status: DC | PRN

## 2014-02-17 NOTE — Telephone Encounter (Signed)
Pt notified. rx rdy for pick up at front desk.  

## 2014-02-17 NOTE — Telephone Encounter (Signed)
UDS -09/19/13 LOW risk

## 2014-02-17 NOTE — Telephone Encounter (Signed)
Caller name:Patria Relation to pt: Call back number: [phone] Pharmacy:   Reason for call: pt is needing a refill RX HYDROcodone-acetaminophen (NORCO) 10-325 MG per tablet

## 2014-02-17 NOTE — Telephone Encounter (Signed)
Done. Let her know, she has 2 prescriptions for April and May

## 2014-02-17 NOTE — Telephone Encounter (Signed)
HYDROcodone-acetaminophen (NORCO) 10-325 MG per tablet  Last OV-  01/16/14 Last refilled- 01/16/14 #120 / 0 rf  UDS-

## 2014-02-23 ENCOUNTER — Ambulatory Visit (INDEPENDENT_AMBULATORY_CARE_PROVIDER_SITE_OTHER): Payer: Medicare Other | Admitting: Physician Assistant

## 2014-02-23 ENCOUNTER — Other Ambulatory Visit (INDEPENDENT_AMBULATORY_CARE_PROVIDER_SITE_OTHER): Payer: Medicare Other

## 2014-02-23 ENCOUNTER — Encounter: Payer: Self-pay | Admitting: Physician Assistant

## 2014-02-23 ENCOUNTER — Telehealth: Payer: Self-pay | Admitting: Internal Medicine

## 2014-02-23 VITALS — BP 128/86 | HR 100 | Temp 98.9°F | Ht 63.0 in | Wt 196.5 lb

## 2014-02-23 DIAGNOSIS — R1031 Right lower quadrant pain: Secondary | ICD-10-CM | POA: Diagnosis not present

## 2014-02-23 DIAGNOSIS — I2510 Atherosclerotic heart disease of native coronary artery without angina pectoris: Secondary | ICD-10-CM

## 2014-02-23 DIAGNOSIS — Z86010 Personal history of colonic polyps: Secondary | ICD-10-CM

## 2014-02-23 LAB — BASIC METABOLIC PANEL
BUN: 15 mg/dL (ref 6–23)
CO2: 26 mEq/L (ref 19–32)
Calcium: 9.5 mg/dL (ref 8.4–10.5)
Chloride: 104 mEq/L (ref 96–112)
Creatinine, Ser: 0.8 mg/dL (ref 0.4–1.2)
GFR: 76.18 mL/min (ref >60.00)
Glucose, Bld: 125 mg/dL — ABNORMAL HIGH (ref 70–99)
Potassium: 4.2 mEq/L (ref 3.5–5.1)
Sodium: 140 mEq/L (ref 135–145)

## 2014-02-23 LAB — CBC WITH DIFFERENTIAL/PLATELET
Basophils Absolute: 0 10*3/uL (ref 0.0–0.1)
Basophils Relative: 0.6 % (ref 0.0–3.0)
Eosinophils Absolute: 0.3 10*3/uL (ref 0.0–0.7)
Eosinophils Relative: 4.6 % (ref 0.0–5.0)
HCT: 39.6 % (ref 36.0–46.0)
Hemoglobin: 13.1 g/dL (ref 12.0–15.0)
Lymphocytes Relative: 36.5 % (ref 12.0–46.0)
Lymphs Abs: 2 10*3/uL (ref 0.7–4.0)
MCHC: 33.1 g/dL (ref 30.0–36.0)
MCV: 86.6 fl (ref 78.0–100.0)
Monocytes Absolute: 0.3 10*3/uL (ref 0.1–1.0)
Monocytes Relative: 6.1 % (ref 3.0–12.0)
Neutro Abs: 2.9 10*3/uL (ref 1.4–7.7)
Neutrophils Relative %: 52.2 % (ref 43.0–77.0)
Platelets: 198 10*3/uL (ref 150.0–400.0)
RBC: 4.57 Mil/uL (ref 3.87–5.11)
RDW: 14.9 % — ABNORMAL HIGH (ref 11.5–14.6)
WBC: 5.5 10*3/uL (ref 4.5–10.5)

## 2014-02-23 MED ORDER — AMOXICILLIN-POT CLAVULANATE 875-125 MG PO TABS: 1.0000 | ORAL_TABLET | Freq: Two times a day (BID) | ORAL | Status: AC

## 2014-02-23 NOTE — Patient Instructions (Signed)
We sent a prescription electronicaly to [facility] [address] for Augnemtin.    You have been scheduled for a CT scan of the abdomen and pelvis at [facility] ([address]---this is in the same building as Press [facility]).   You are scheduled on Tues 5-5  at 2:30 PM . You should arrive at 2;15 PM  prior to your appointment time for registration. Please follow the written instructions below on the day of your exam:  WARNING: IF YOU ARE ALLERGIC TO IODINE/X-RAY DYE, PLEASE NOTIFY RADIOLOGY IMMEDIATELY AT [phone]! YOU WILL BE GIVEN A 13 HOUR PREMEDICATION PREP.  1) Do not eat or drink anything after 10:30 am  (4 hours prior to your test) 2) You have been given 2 bottles of oral contrast to drink. The solution may taste better if refrigerated, but do NOT add ice or any other liquid to this solution. Shake well before drinking.    Drink 1 bottle of contrast @ 12;30 PM (2 hours prior to your exam)  Drink 1 bottle of contrast @ 1:30 PM  (1 hour prior to your exam)  You may take any medications as prescribed with a small amount of water except for the following: Metformin, Glucophage, Glucovance, Avandamet, Riomet, Fortamet, Actoplus Met, Janumet, Glumetza or Metaglip. The above medications must be held the day of the exam AND 48 hours after the exam.  The purpose of you drinking the oral contrast is to aid in the visualization of your intestinal tract. The contrast solution may cause some diarrhea. Before your exam is started, you will be given a small amount of fluid to drink. Depending on your individual set of symptoms, you may also receive an intravenous injection of x-ray contrast/dye. Plan on being at [facility] for 30 minutes or long, depending on the type of exam you are having performed.  If you have any questions regarding your exam or if you need to reschedule, you may call the CT department at [phone] between the hours of 8:00 am and 5:00 pm,  Monday-Friday.  ________________________________________________________________________

## 2014-02-23 NOTE — Telephone Encounter (Signed)
Spoke with patient and she states she had right sided abdominal pain last week. She took pain medication and felt better. Denies fever. On Saturday night, she had pain again. She ended up calling Dr. Collene Mares around 5 AM. Pain continues and she is scheduled for some back procedure later this week and is not sure what to do. Scheduled with Nicoletta Ba, PA today at 3:00 PM.

## 2014-02-23 NOTE — Progress Notes (Signed)
Subjective:    Patient ID: [name], female    DOB: 1935-07-10, 79 y.o.   MRN: [id number]  HPI  [name]  is a pleasant 79 year old white female known to Dr. Delfin Edis. She underwent a colonoscopy on 01/28/2014 for followup. She has a history of carcinoma in situ and a polyp in 1994 and has had an adenomatous polyp since. At colonoscopy she was found to have 3 sessile polyps there was a large polyp in the cecal pouch measuring about 2 cm in diameter flat and multilobulated which was removed with a cold snare in pieces. She then had 3 endoclips placed in he was injected with epinephrine. Large polyp was felt to be about 90% removed.. Patient developed pain post polypectomy and required hospitalization for 3 days. Biopsy was consistent with a tubular adenoma. CT scan showed edema at the cecum but no perforation. She was seen back in followup by Dr. Olevia Perches on 02/10/2014 and at that point was feeling better. Decision was made to wait about 6 months before deciding on repeat colonoscopy versus surgical referral for removal of the residual polyp.  Patient says that she developed recurrent right lower quadrant pain last week which seemed to come and go for the first couple of days and then on Saturday, 02/21/2014 worsened and has been constant since. She says she got about going to the hospital but did not want to go to the emergency room so she has remained at home. She has decreased or oral intake and says that eating definitely increases the pain so primarily she's been on liquids. She has not had any fevers chills or sweats she has had some mild nausea. She was able to have a bowel movement yesterday no melena or hematochezia. She has no dysuria urgency or frequency. She says she does have pain in her lower back which sometimes radiates around into her leg and she is actually scheduled for a procedure on her back later this week. She says sometimes it is hard to tell if the pain is coming from her back or her  abdomen. She came in today because of worsening of the pain. She says she's been using her pain medication for her back on her regular basis for the abdominal pain.    Review of Systems  Constitutional: Positive for activity change, appetite change and fatigue.  HENT: Negative.   Eyes: Negative.   Respiratory: Negative.   Cardiovascular: Negative.   Gastrointestinal: Positive for nausea and abdominal pain.  Endocrine: Negative.   Genitourinary: Negative.   Musculoskeletal: Positive for back pain and gait problem.  Allergic/Immunologic: Negative.   Neurological: Negative.   Hematological: Negative.   Psychiatric/Behavioral: Negative.    Outpatient Prescriptions Prior to Visit  Medication Sig Dispense Refill  . acetaminophen (TYLENOL) 500 MG tablet Take 500 mg by mouth daily as needed for mild pain or moderate pain.       . Alpha-Lipoic Acid 600 MG CAPS Take 600 mg by mouth 2 (two) times daily.      Marland Kitchen amLODipine-benazepril (LOTREL) 5-10 MG per capsule take 1 capsule by mouth once daily  30 capsule  2  . aspirin EC 81 MG tablet Take 1 tablet (81 mg total) by mouth daily.      . Boron 3 MG CAPS Take 3 mg by mouth every evening.       . Cholecalciferol (VITAMIN D3) 5000 UNITS CAPS Take 5,000 Units by mouth every evening.      . Cyanocobalamin (VITAMIN  B-12) 2500 MCG SUBL Place 2,500 mcg under the tongue daily.      . diclofenac sodium (VOLTAREN) 1 % GEL Apply 1 application topically daily as needed (pain).      . hydrochlorothiazide (MICROZIDE) 12.5 MG capsule Take 12.5 mg by mouth daily.      Marland Kitchen HYDROcodone-acetaminophen (NORCO) 10-325 MG per tablet Take 1 tablet by mouth every 6 (six) hours as needed for moderate pain or severe pain.  120 tablet  0  . hydrocortisone (ANUSOL-HC) 25 MG suppository Place 25 mg rectally 2 (two) times daily as needed for hemorrhoids.       . hyoscyamine (LEVSIN SL) 0.125 MG SL tablet Place 1 tablet (0.125 mg total) under the tongue every 4 (four) hours as  needed for cramping.  90 tablet  3  . Krill Oil 300 MG CAPS Take 300 mg by mouth every evening.       . meclizine (ANTIVERT) 12.5 MG tablet Take 12.5 mg by mouth 3 (three) times daily as needed for dizziness.      . metoprolol succinate (TOPROL-XL) 25 MG 24 hr tablet Take 1 tablet (25 mg total) by mouth daily.  30 tablet  11  . Multiple Vitamins-Minerals (PRESERVISION AREDS 2 PO) Take 2 tablets by mouth daily.      . nabumetone (RELAFEN) 750 MG tablet Take 750 mg by mouth 2 (two) times daily.      Marland Kitchen omeprazole (PRILOSEC) 20 MG capsule Take 20 mg by mouth 2 (two) times daily.      Marland Kitchen OVER THE COUNTER MEDICATION Take 400 mg by mouth 2 (two) times daily. tumeric      . OVER THE COUNTER MEDICATION Take 1 capsule by mouth 2 (two) times daily. Integrative Digestive Formula      . Polyvinyl Alcohol-Povidone (REFRESH OP) Place 1 drop into both eyes daily as needed (dry eyes).      . Probiotic Product (PROBIOTIC DAILY PO) Take 1 capsule by mouth daily.       No facility-administered medications prior to visit.   Allergies  Allergen Reactions  . Cymbalta [Duloxetine Hcl] Swelling    Swelling in legs  . Gabapentin Swelling    Swelling in legs  . Adhesive [Tape]     rash  . Cefazolin Hives  . Ciprofloxacin Other (See Comments)    Per pt, caused body aches   . Clorazepate Dipotassium Other (See Comments)    Unknown reaction  . Darifenacin Hydrobromide Other (See Comments)    hypotension, near syncope  . Dilaudid [Hydromorphone Hcl] Other (See Comments)     confused, intense itching  . Enablex [Darifenacin Hydrobromide Er] Other (See Comments)    Hypotension, near syncope  . Levofloxacin     Causes wrist pain  . Lyrica [Pregabalin] Swelling    Swelling in legs  . Methadone Hcl Other (See Comments)    Reaction unknown  . Morphine And Related Other (See Comments)    Confusion   . Pentazocine Lactate Other (See Comments)    Altered mental state, "climbing walls", anxiety  . Talwin  [Pentazocine] Other (See Comments)    "climbing walls" anxiety  . Doxycycline Rash   Patient Active Problem List   Diagnosis Date Noted  . 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 Colitis 01/28/2014  . Anemia 01/18/2014  . Nonspecific abnormal unspecified cardiovascular function study 10/31/2013  . Accelerating angina 10/31/2013  . Rectal bleeding 10/31/2013  . Hemorrhoids 10/31/2013  . CAD (coronary artery disease)   .  Carcinoma in situ in a polyp   . Depression 09/17/2013  . Charcot's joint of right foot S/P Reconstruction 08/21/2013  . Osteoarthritis 08/21/2013  . Foot osteomyelitis, right 07/16/2013  . Extrinsic asthma, unspecified 07/16/2013  . GERD (gastroesophageal reflux disease)   . Syncope   . Neuropathy   . Hyperthyroidism   . Lower extremity edema 03/08/2011  . ALLERGIC RHINITIS 04/15/2010  . ORTHOSTATIC DIZZINESS 04/13/2010  . HIATAL HERNIA 12/06/2009  . GASTRIC ULCER, HX OF 12/06/2009  . DYSPNEA 03/18/2009  . UTI'S, RECURRENT 07/07/2008  . DEGENERATIVE JOINT DISEASE 06/03/2008  . COLONIC POLYPS 01/27/2008  . DIVERTICULOSIS, COLON 01/27/2008  . IRRITABLE BOWEL SYNDROME, HX OF 01/27/2008  . PULMONARY NODULE 01/22/2008  . HYPERLIPIDEMIA 12/30/2007  . Osteopenia 12/30/2007  . PE (pulmonary embolism) 12/22/2007  . TB SKIN TEST, POSITIVE 08/09/2007  . DIABETES MELLITUS, TYPE II 05/09/2007  . REFLEX SYMPATHETIC DYSTROPHY, PAIN MGMT 05/09/2007  . HYPERTENSION 04/15/2007  . GERD 04/15/2007   History  Substance Use Topics  . Smoking status: Never Smoker   . Smokeless tobacco: Never Used  . Alcohol Use: No   family history includes Breast cancer in an other family member; Diabetic kidney disease in her daughter; Heart disease in her mother; Hypertension in an other family member; Migraines in an other family member; Rheum arthritis in her mother. There is no history of Colon cancer, Esophageal cancer, Stomach cancer, or Rectal  cancer.     Objective:   Physical Exam well-developed older white female in no acute distress, pleasant blood pressure 128/86 pulse 100 temp 98 9 height 5 foot 3 weight 196. HEENT; nontraumatic normocephalic EOMI PERRLA sclera anicteric, Supple ;no JVD, Cardiovascular; regular rate and rhythm with S1-S2 no murmur or gallop, Pulmonary; clear bilaterally, Abdomen; large soft she is tender in the right lower quadrant no rebound no palpable mass or hepatosplenomegaly bowel sounds are present, Rectal; exam not done, Extremities she has a brace on her right lower stranding and ambulates with a walker no clubbing cyanosis or edema skin warm and dry, Psych ;mood and affect appropriate        Assessment & Plan:  #13  79 year old female with a post polypectomy syndrome requiring hospitalization for pain mid April 2015 after removal of a large carpeted sessile polyp of the cecal pouch. CT scan done at that time showed edema of the cecum but no abscess or perforation. Patient comes in today with recurrent right lower quadrant pain over the past 4-5 days now constant. Will need to rule out complication from the polypectomy site i.e. Abscess.  #2 history of adenomatous colon polyps and a carcinoma in situ remotely within a polyp #3 diverticulosis #4 chronic back pain #5 coronary artery disease #6 diabetes mellitus #7 history of gastric ulcer #8 chronic GERD #9 reflex sympathetic dystrophy lower extremity  Plan; CBC with differential and BMET CT scan of the abdomen and pelvis with contrast within the next 24 hours. Patient to stay on clear liquids Patient has Norco at home which she will use as needed for abdominal pain Start Augmentin 875 by mouth twice daily lead until CT reviewed. Patient also advised that should her pain worsened nor she developed fever etc. that she go to the emergency room for evaluation and admission in the interim.

## 2014-02-24 ENCOUNTER — Ambulatory Visit (INDEPENDENT_AMBULATORY_CARE_PROVIDER_SITE_OTHER)
Admission: RE | Admit: 2014-02-24 | Discharge: 2014-02-24 | Disposition: A | Discharge status: Home / Self Care | Payer: Medicare Other | Source: Ambulatory Visit | Attending: Physician Assistant | Admitting: Physician Assistant

## 2014-02-24 DIAGNOSIS — Z86010 Personal history of colonic polyps: Secondary | ICD-10-CM | POA: Diagnosis not present

## 2014-02-24 DIAGNOSIS — R1031 Right lower quadrant pain: Secondary | ICD-10-CM

## 2014-02-24 MED ORDER — IOHEXOL 300 MG/ML  SOLN
100.0000 mL | Freq: Once | INTRAMUSCULAR | Status: AC | PRN
  Administered 2014-02-24: 100 mL via INTRAVENOUS

## 2014-02-24 NOTE — Progress Notes (Signed)
Reviewed. I agree we need to r/o abcess or on-going inflammation after extensive polypectomy.

## 2014-02-25 ENCOUNTER — Encounter: Payer: Self-pay | Admitting: *Deleted

## 2014-03-02 ENCOUNTER — Encounter: Payer: Self-pay | Admitting: *Deleted

## 2014-03-05 ENCOUNTER — Encounter: Payer: Self-pay | Admitting: Cardiology

## 2014-03-05 ENCOUNTER — Ambulatory Visit (INDEPENDENT_AMBULATORY_CARE_PROVIDER_SITE_OTHER): Payer: Medicare Other | Admitting: Cardiology

## 2014-03-05 VITALS — BP 122/78 | HR 88 | Ht 63.0 in | Wt 195.0 lb

## 2014-03-05 DIAGNOSIS — R079 Chest pain, unspecified: Secondary | ICD-10-CM

## 2014-03-05 DIAGNOSIS — I10 Essential (primary) hypertension: Secondary | ICD-10-CM

## 2014-03-05 DIAGNOSIS — I2510 Atherosclerotic heart disease of native coronary artery without angina pectoris: Secondary | ICD-10-CM

## 2014-03-05 MED ORDER — NITROGLYCERIN 0.4 MG SL SUBL: 0.4000 mg | SUBLINGUAL_TABLET | SUBLINGUAL | Status: DC | PRN

## 2014-03-05 NOTE — Patient Instructions (Signed)
Your physician recommends that you continue on your current medications as directed. Please refer to the Current Medication list given to you today.  NITROGLYCERIN HAS BEEN PRESCRIBED FOR YOU TO TAKE 1 TABLET SUBLINGUAL (UNDER THE TONGUE) EVERY 5 MINUTES AS NEEDED FOR CHEST PAIN. DO NOT TAKE MORE THAN 3 TABLETS. IF NO RELIEF FELT, PLEASE CALL 911.  Your physician recommends that you schedule a follow-up appointment in: [place]

## 2014-03-05 NOTE — Progress Notes (Signed)
Patient ID: [name], female   DOB: 1935/04/05, 79 y.o.   MRN: [id number]     Date:  03/05/2014   ID:  [name], DOB 1935-06-20, MRN [id number]  PCP:  [name], MD  Cardiologist:  Dr. Thompson Grayer => Dr. Ena Dawley     History of Present Illness: [name] is a 79 y.o. female with a hx of HTN, HL, T2DM, orthostatic hypotension, prior DVT after neck surgery in 2010, s/p IVC filter, GERD and reflex sympathetic dystrophy.    She had an extensive workup for dyspnea in the past.  Please see my last office visit note from 10/29/2013 for complete details.  Patient was evaluated by Dr. Rayann Heman in January for chest pain and shortness of breath.  Myoview was abnormal and cardiac catheterization was recommended. Patient was admitted 1/9-1/10. Cardiac catheterization demonstrated high-grade disease in the second diagonal which was treated with cutting balloon angioplasty.  Patient was to have GI evaluation in the near future (EGD and colonoscopy).  She had a recent history of dysphagia as well as rectal bleeding with prior history of adenomatous polyps and in situ carcinoma.  Therefore, long term commitment to dual antiplatelet Rx was to be avoided.    She has noted recurrent left-sided chest discomfort. She feels as though that this is indigestion. She has had this for years without significant change. She is fairly sedentary. She cannot tell me if she has had significant improvement with any symptoms since her angioplasty. She denies associated dyspnea. She denies exertional chest discomfort. She does take Gas-X for her chest discomfort with relief.  Overall, she describes NYHA class 2-2b symptoms.  She denies syncope. She denies significant pedal edema.  She is requesting clearance to undergo epidural steroid injections as well as EGD/colonoscopy.  Echocardiogram (10/14/13): Mild LVH, mild focal basal hypertrophy of the septum, EF 60-65%, normal wall motion, grade 1 diastolic dysfunction, MAC,  mild LAE, atrial septal aneurysm, PASP 34. There was a possible oscillating density on the mitral valve on parasternal long axis views (related to MAC).   LexiScan Myoview (10/20/13): Intermediate risk; medium-size reversible defect in the mid LAD territory, EF 68%. LHC (10/31/13): Proximal D2 95, mid circumflex 40, LVEDP 10.  PCI:  Cutting Balloon angioplasty to D2.  The patient has a long history of multiple polyps in her cecum and colon, with history of a malignant polyp many years ago. She just underwent a colonoscopy and EGD with finding of 3 polyps with removal of 2 and partial removal of a large 1. Biopsy showed tubular adenoma and she has a scheduled followup appointment with Dr. Maurene Capes to discuss further management.  She otherwise reports stable chest pain and back pain that's not related to exertion and is relieved by antacids. She denies any shortness of breath, palpitation or syncope.   Recent Labs: 09/17/2013: Direct LDL 117.9; HDL Cholesterol 39.30; TSH 1.91  01/28/2014: ALT 7  02/23/2014: Creatinine 0.8; Hemoglobin 13.1; Potassium 4.2   Wt Readings from Last 3 Encounters:  03/05/14 195 lb (88.451 kg)  02/23/14 196 lb 8 oz (89.132 kg)  02/10/14 199 lb (90.266 kg)     Past Medical History  Diagnosis Date  . Hyperlipidemia     a. patient unwilling to use statins.  . Osteoporosis   . DVT (deep venous thrombosis) 12/2007  . PE (pulmonary embolism) 12/2007    a. PE/DVT after neck surgery 2009. b. coumadin d/c 10-2008.  . Pulmonary nodule     incidental  per CT:  Pet scan 4-9: likely benign, CT 08-2009 no change, no further CTs (Dr. Gwenette Greet)  . Hemorrhoids   . IBS (irritable bowel syndrome)   . Diverticulosis   . PPD positive   . Macular degeneration 03/2009    Dr. Rosana Hoes  . Allergic rhinitis   . Dyspnea     a. Chronic, extensive w/u see OV note 09-2010. b. [place] 2011: ormal with RA 5 RV 31/2 PA 27/12 (19) PCW 10 CO normal. No evidence of shunting with sitting up in cath lab. c. CPX  2011: see report. d. Prior fluoro of diaphragm -  R diaphragm elevated at rest but both moved with inspiration.   Marland Kitchen Spinal stenosis   . GERD (gastroesophageal reflux disease)     a. Hx GERD/esophageal dysmotility followed by Dr. Olevia Perches.   Marland Kitchen Hypertension   . Neuropathy     a. Hands, feet, legs.  . Orthostatic hypotension   . CAD (coronary artery disease)     a. Nonobst in 2011. b. Abnormal nuc 09/2013 -> s/p cutting balloon to D2; mild LAD disease 10/31/13.  . Osteomyelitis     a. Adm 04/2013: Charcot collapse of the right foot with osteomyelitis and ulceration, s/p excision.  . Venous insufficiency     a. Contributing to LEE.  . Type II diabetes mellitus   . DJD (degenerative joint disease)   . Arthritis     "back; ankles; hands; knees" (10/31/2013)  . Recurrent UTI   . 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 1994 - malignant polyp removed during colonoscopy.  . Hiatal hernia     a. s/p Nissen fundoplication 4098.  Marland Kitchen RSD (reflex sympathetic dystrophy)     a. Chronic pain.  . Abnormal echocardiogram     09/2013: mild LVH, mild focal basal hypertrophy of septum, EF 60-65%, normal WM, grade 1 diastolic dysfunction, MAC, mild LAE, ASA, PASP 34. Possible oscillating MV density - reviewed by MD and felt there was no significant abnormality other than MAC noted with mitral valve and did not require TEE.  . Adenomatous colon polyp     Current Outpatient Prescriptions  Medication Sig Dispense Refill  . acetaminophen (TYLENOL) 500 MG tablet Take 500 mg by mouth daily as needed for mild pain or moderate pain.       . Alpha-Lipoic Acid 600 MG CAPS Take 600 mg by mouth 2 (two) times daily.      Marland Kitchen amLODipine-benazepril (LOTREL) 5-10 MG per capsule take 1 capsule by mouth once daily  30 capsule  2  . amoxicillin-clavulanate (AUGMENTIN) 875-125 MG per tablet Take 1 tablet by mouth 2 (two) times daily.  20 tablet  0  . aspirin EC 81 MG tablet Take 1 tablet (81 mg total) by mouth daily.      . Boron 3  MG CAPS Take 3 mg by mouth every evening.       . Cholecalciferol (VITAMIN D3) 5000 UNITS CAPS Take 5,000 Units by mouth every evening.      . Cyanocobalamin (VITAMIN B-12) 2500 MCG SUBL Place 2,500 mcg under the tongue daily.      . diclofenac sodium (VOLTAREN) 1 % GEL Apply 1 application topically daily as needed (pain).      . hydrochlorothiazide (MICROZIDE) 12.5 MG capsule Take 12.5 mg by mouth daily.      Marland Kitchen HYDROcodone-acetaminophen (NORCO) 10-325 MG per tablet Take 1 tablet by mouth every 6 (six) hours as needed for moderate pain or  severe pain.  120 tablet  0  . hydrocortisone (ANUSOL-HC) 25 MG suppository Place 25 mg rectally 2 (two) times daily as needed for hemorrhoids.       . hyoscyamine (LEVSIN SL) 0.125 MG SL tablet Place 1 tablet (0.125 mg total) under the tongue every 4 (four) hours as needed for cramping.  90 tablet  3  . Krill Oil 300 MG CAPS Take 300 mg by mouth every evening.       . meclizine (ANTIVERT) 12.5 MG tablet Take 12.5 mg by mouth 3 (three) times daily as needed for dizziness.      . metoprolol succinate (TOPROL-XL) 25 MG 24 hr tablet Take 1 tablet (25 mg total) by mouth daily.  30 tablet  11  . Multiple Vitamins-Minerals (PRESERVISION AREDS 2 PO) Take 2 tablets by mouth daily.      . nabumetone (RELAFEN) 750 MG tablet Take 750 mg by mouth 2 (two) times daily.      Marland Kitchen omeprazole (PRILOSEC) 20 MG capsule Take 20 mg by mouth 2 (two) times daily.      Marland Kitchen OVER THE COUNTER MEDICATION Take 400 mg by mouth 2 (two) times daily. tumeric      . OVER THE COUNTER MEDICATION Take 1 capsule by mouth 2 (two) times daily. Integrative Digestive Formula      . Polyvinyl Alcohol-Povidone (REFRESH OP) Place 1 drop into both eyes daily as needed (dry eyes).      . Probiotic Product (PROBIOTIC DAILY PO) Take 1 capsule by mouth daily.       No current facility-administered medications for this visit.    Allergies:   Cymbalta; Gabapentin; Adhesive; Cefazolin; Ciprofloxacin; Clorazepate  dipotassium; Darifenacin hydrobromide; Dilaudid; Enablex; Levofloxacin; Lyrica; Methadone hcl; Morphine and related; Pentazocine lactate; Talwin; and Doxycycline   Social History:  The patient  reports that she has never smoked. She has never used smokeless tobacco. She reports that she does not drink alcohol or use illicit drugs.   Family History:  The patient's family history includes Breast cancer in an other family member; Diabetic kidney disease in her daughter; Headache in her sister; Heart disease in her mother; Hypertension in an other family member; Migraines in her sister; Rheum arthritis in her mother. There is no history of Colon cancer, Esophageal cancer, Stomach cancer, or Rectal cancer.   ROS:  Please see the history of present illness.   All other systems reviewed and negative.   PHYSICAL EXAM: VS:  BP 122/78  Pulse 88  Ht 5\' 3"  (1.6 m)  Wt 195 lb (88.451 kg)  BMI 34.55 kg/m2 Well nourished, well developed, in no acute distress HEENT: normal Neck: no JVD at 90 Cardiac:  normal S1, S2; RRR; no murmur Lungs:  clear to auscultation bilaterally, no wheezing, rhonchi or rales Abd: soft, nontender, no hepatomegaly Ext: trace bilateral LE edema right wrist without hematoma or mass  Skin: warm and dry Neuro:  CNs 2-12 intact, no focal abnormalities noted  EKG:  NSR, HR 74, normal axis, nonspecific ST-T wave changes   ASSESSMENT AND PLAN:  1. Chest Pain:  This is atypical. She has had this symptom for years without significant change. I do not believe that this is cardiac, improves with antacids. No further workup is recommended.       We will prescribe her sl NTG to try with the episodes of chest pain.  2. CAD:  Stable since angioplasty. She is greater than 30 days out. I reviewed her case with Dr.  Casandra Doffing.  She may hold Plavix for her upcoming EGD/colonoscopy and epidural steroid injection. She required no further cardiac workup prior to these procedures and is at  acceptable risk. She will need to remain on aspirin throughout. We can consider resuming Plavix in the future after her procedures. I have asked her to remain off of Plavix until she is seen back in follow up. She will continue aspirin and beta blocker. She is intolerant to statins. 3. Hypertension:  Controlled. 4. Disposition:  F/u with Dr. Ena Dawley in 3 months.    Signed, Dorothy Spark, MD  03/05/2014 3:18 PM

## 2014-03-06 ENCOUNTER — Telehealth: Payer: Self-pay | Admitting: Internal Medicine

## 2014-03-06 NOTE — Telephone Encounter (Signed)
Patient completed her antibiotics yesterday. She is still having a little pain in the RLQ. She wants to know if she needs to continue the antibiotics longer. Please, advise.

## 2014-03-06 NOTE — Telephone Encounter (Signed)
No -should not need any more antibiotics...please et her a follow up appt with Dr. Olevia Perches is she doesn't already have one... She had a very large polyp removed and had inflammatory process at cecum after that

## 2014-03-06 NOTE — Telephone Encounter (Signed)
Patient given Nicoletta Ba, PA recommendations. She has a f/u scheduled already.

## 2014-03-13 ENCOUNTER — Encounter: Payer: Self-pay | Admitting: Internal Medicine

## 2014-03-13 ENCOUNTER — Ambulatory Visit (INDEPENDENT_AMBULATORY_CARE_PROVIDER_SITE_OTHER): Payer: Medicare Other | Admitting: Internal Medicine

## 2014-03-13 VITALS — BP 124/78 | HR 80 | Ht 63.0 in | Wt 196.0 lb

## 2014-03-13 DIAGNOSIS — I2510 Atherosclerotic heart disease of native coronary artery without angina pectoris: Secondary | ICD-10-CM | POA: Diagnosis not present

## 2014-03-13 DIAGNOSIS — Z9889 Other specified postprocedural states: Secondary | ICD-10-CM

## 2014-03-13 DIAGNOSIS — R1031 Right lower quadrant pain: Secondary | ICD-10-CM | POA: Diagnosis not present

## 2014-03-13 DIAGNOSIS — Z86010 Personal history of colonic polyps: Secondary | ICD-10-CM | POA: Diagnosis not present

## 2014-03-13 NOTE — Patient Instructions (Signed)
Dr Larose Kells, Dr Liane Comber

## 2014-03-13 NOTE — Progress Notes (Signed)
[name] July 07, 1935 [id number]  Note: This dictation was prepared with Dragon digital system. Any transcriptional errors that result from this procedure are unintentional.   History of Present Illness:  This is a 79 year old white female who had post polypectomy abdominal pain after removal of 4 large descending colon polyps. There were inflammatory changes around the colon but no perforation. She had a recurrence of right lower quadrant abdominal pain about 2 weeks later.and   was seen by the PA-C and a CT scan was repeated but at that time showed resolution of the inflammatory changes. She was given another course of antibiotics and today feels well without abdominal pain, constipation or rectal bleeding. She has a history of gastroesophageal reflux. She had a Nissen fundoplication in 4098. She has reflux sympathetic dystrophy, depression and anxiety. 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 We have been discussing possibility of a right hemicolectomy but so far prefers to repeat the colonoscopy in one year.    Past Medical History  Diagnosis Date  . Hyperlipidemia     a. patient unwilling to use statins.  . Osteoporosis   . DVT (deep venous thrombosis) 12/2007  . PE (pulmonary embolism) 12/2007    a. PE/DVT after neck surgery 2009. b. coumadin d/c 10-2008.  . Pulmonary nodule     incidental per CT:  Pet scan 4-9: likely benign, CT 08-2009 no change, no further CTs (Dr. Gwenette Greet)  . Hemorrhoids   . IBS (irritable bowel syndrome)   . Diverticulosis   . PPD positive   . Macular degeneration 03/2009    Dr. Rosana Hoes  . Allergic rhinitis   . Dyspnea     a. Chronic, extensive w/u see OV note 09-2010. b. Ider 2011: ormal with RA 5 RV 31/2 PA 27/12 (19) PCW 10 CO normal. No evidence of shunting with sitting up in cath lab. c. CPX 2011: see report. d. Prior fluoro of diaphragm -  R diaphragm elevated at rest but both moved with inspiration.   Marland Kitchen Spinal stenosis   .  GERD (gastroesophageal reflux disease)     a. Hx GERD/esophageal dysmotility followed by Dr. Olevia Perches.   Marland Kitchen Hypertension   . Neuropathy     a. Hands, feet, legs.  . Orthostatic hypotension   . CAD (coronary artery disease)     a. Nonobst in 2011. b. Abnormal nuc 09/2013 -> s/p cutting balloon to D2; mild LAD disease 10/31/13.  . Osteomyelitis     a. Adm 04/2013: Charcot collapse of the right foot with osteomyelitis and ulceration, s/p excision.  . Venous insufficiency     a. Contributing to LEE.  . Type II diabetes mellitus   . DJD (degenerative joint disease)   . Arthritis     "back; ankles; hands; knees" (10/31/2013)  . Recurrent UTI   . 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 1994 - malignant polyp removed during colonoscopy.  . Hiatal hernia     a. s/p Nissen fundoplication 1191.  Marland Kitchen RSD (reflex sympathetic dystrophy)     a. Chronic pain.  . Abnormal echocardiogram     09/2013: mild LVH, mild focal basal hypertrophy of septum, EF 60-65%, normal WM, grade 1 diastolic dysfunction, MAC, mild LAE, ASA, PASP 34. Possible oscillating MV density - reviewed by MD and felt there was no significant abnormality other than MAC noted with mitral valve and did not require TEE.  . Adenomatous colon polyp  Past Surgical History  Procedure Laterality Date  . Nissen fundoplication  10/28/6061  . Cholecystectomy  03/2002  . Cataract extraction w/ intraocular lens  implant, bilateral  2004    feb 2004 left, aug 2004 right  . Rotator cuff repair Right 07/2007    Dr. Percell Miller  . Anterior cervical decomp/discectomy fusion  12/18/07    For OA,  Dr. Lorin Mercy:  fu by a PE  . Toe amputation Right 08/2008    3rd toe, Dr. Sharol Given due to osteomyelitis  . Nasal septum surgery  09/1963  . Vein ligation Bilateral 03/1966  . Carpal tunnel release Left 09/2011  . Amputation  03/06/2012    Procedure: AMPUTATION FOOT;  Surgeon: Newt Minion, MD;  Location: [facility];  Service: Orthopedics;  Laterality: Left;  FIFTH RAY  AMPUTATION   . Vena cava filter placement  01/2010    green filter; "due to blood clots"  . Ankle fusion  09/27/2012    Procedure: ANKLE FUSION;  Surgeon: Newt Minion, MD;  Location: [place] [facility];  Service: Orthopedics;  Laterality: Left;  Left Tibiocalcaneal Fusion  . Ankle fusion Right 05/09/2013    Procedure: ANKLE FUSION;  Surgeon: Newt Minion, MD;  Location: [facility];  Service: Orthopedics;  Laterality: Right;  Excision Osteomyelitis Base 1st MT Right Foot, Fusion Medial Column  . Cardiac catheterization  05/2010     at [facility]  . Coronary angioplasty  10/31/2013  . Vaginal hysterectomy  03/1975  . Ankle surgery Left apr & june 1991  . Cyst removal hand  06/2003  . Rotator cuff repair Left 11/2004  . Wisdom tooth extraction  06/2007  . Foot bone excision Right 06/2009    Allergies  Allergen Reactions  . Cymbalta [Duloxetine Hcl] Swelling    Swelling in legs  . Gabapentin Swelling    Swelling in legs  . Adhesive [Tape]     rash  . Cefazolin Hives  . Ciprofloxacin Other (See Comments)    Per pt, caused body aches   . Clorazepate Dipotassium Other (See Comments)    Unknown reaction  . Darifenacin Hydrobromide Other (See Comments)    hypotension, near syncope  . Dilaudid [Hydromorphone Hcl] Other (See Comments)     confused, intense itching  . Enablex [Darifenacin Hydrobromide Er] Other (See Comments)    Hypotension, near syncope  . Levofloxacin     Causes wrist pain  . Lyrica [Pregabalin] Swelling    Swelling in legs  . Methadone Hcl Other (See Comments)    Reaction unknown  . Morphine And Related Other (See Comments)    Confusion   . Pentazocine Lactate Other (See Comments)    Altered mental state, "climbing walls", anxiety  . Talwin [Pentazocine] Other (See Comments)    "climbing walls" anxiety  . Doxycycline Rash    Family history and social history have been reviewed.  Review of Systems:   The remainder of the 10 point ROS is negative except as outlined in the  H&P  Physical Exam: General Appearance Well developed, in no distress Psychological Normal mood and affect  Assessment and Plan:   Problem #1 Post polypectomy abdominal pain which has now resolved. I will see her in 6 months for followup and she will have a colonoscopy in 1 year.  [place] Dragon 03/13/2014

## 2014-03-24 DIAGNOSIS — M949 Disorder of cartilage, unspecified: Secondary | ICD-10-CM | POA: Diagnosis not present

## 2014-03-24 DIAGNOSIS — Z1231 Encounter for screening mammogram for malignant neoplasm of breast: Secondary | ICD-10-CM | POA: Diagnosis not present

## 2014-03-24 DIAGNOSIS — M899 Disorder of bone, unspecified: Secondary | ICD-10-CM | POA: Diagnosis not present

## 2014-03-24 DIAGNOSIS — Z8262 Family history of osteoporosis: Secondary | ICD-10-CM | POA: Diagnosis not present

## 2014-03-24 LAB — HM MAMMOGRAPHY

## 2014-03-24 LAB — HM DEXA SCAN

## 2014-03-25 ENCOUNTER — Other Ambulatory Visit: Payer: Self-pay

## 2014-03-25 DIAGNOSIS — M545 Low back pain, unspecified: Secondary | ICD-10-CM | POA: Diagnosis not present

## 2014-03-25 DIAGNOSIS — I10 Essential (primary) hypertension: Secondary | ICD-10-CM

## 2014-03-25 DIAGNOSIS — M5380 Other specified dorsopathies, site unspecified: Secondary | ICD-10-CM | POA: Diagnosis not present

## 2014-03-25 DIAGNOSIS — M47817 Spondylosis without myelopathy or radiculopathy, lumbosacral region: Secondary | ICD-10-CM | POA: Diagnosis not present

## 2014-03-25 MED ORDER — METOPROLOL SUCCINATE ER 25 MG PO TB24: 25.0000 mg | ORAL_TABLET | Freq: Every day | ORAL | Status: DC

## 2014-04-01 ENCOUNTER — Encounter: Payer: Self-pay | Admitting: *Deleted

## 2014-04-02 ENCOUNTER — Telehealth: Payer: Self-pay | Admitting: Internal Medicine

## 2014-04-02 NOTE — Telephone Encounter (Signed)
No organic reason for RLQ abd. Pain. Please take Levsin SL.125 mg tid ac.

## 2014-04-02 NOTE — Telephone Encounter (Signed)
Patient states she is having lower right quad pain off and on. Usually occurs after she eats. She does report some nausea and constipation. She will try Levsin and see it this helps.

## 2014-04-03 ENCOUNTER — Encounter: Payer: Self-pay | Admitting: Internal Medicine

## 2014-04-03 NOTE — Telephone Encounter (Signed)
Spoke with patient and gave her recommendation. 

## 2014-04-08 ENCOUNTER — Encounter: Payer: Self-pay | Admitting: Internal Medicine

## 2014-04-11 ENCOUNTER — Other Ambulatory Visit: Payer: Self-pay | Admitting: Internal Medicine

## 2014-04-13 ENCOUNTER — Telehealth: Payer: Self-pay | Admitting: *Deleted

## 2014-04-13 MED ORDER — HYDROCODONE-ACETAMINOPHEN 10-325 MG PO TABS
1.0000 | ORAL_TABLET | Freq: Four times a day (QID) | ORAL | Status: DC | PRN
Start: 2014-04-13 — End: 2014-06-04

## 2014-04-13 MED ORDER — HYDROCODONE-ACETAMINOPHEN 10-325 MG PO TABS: 1.0000 | ORAL_TABLET | Freq: Four times a day (QID) | ORAL | Status: DC | PRN

## 2014-04-13 NOTE — Telephone Encounter (Signed)
rx refill hydrocodone 10 - 325mg   Last OV- 01/16/14 Last refilled- 02/17/14 # 120 / 0 rf UDS- 09/19/13 LOW risk.

## 2014-04-13 NOTE — Telephone Encounter (Signed)
Caller name: Vear Staton Relation to pt: patient Call back number: [phone] Pharmacy:  Reason for call: to request a refill for HYDROcodone-acetaminophen (NORCO) 10-325 MG per tablet for two months

## 2014-04-13 NOTE — Telephone Encounter (Signed)
Pt notified of rx pick up at our front desk.

## 2014-04-13 NOTE — Telephone Encounter (Signed)
2 Rx printed

## 2014-04-14 ENCOUNTER — Telehealth: Payer: Self-pay | Admitting: Internal Medicine

## 2014-04-14 DIAGNOSIS — M545 Low back pain, unspecified: Secondary | ICD-10-CM | POA: Diagnosis not present

## 2014-04-14 DIAGNOSIS — M47817 Spondylosis without myelopathy or radiculopathy, lumbosacral region: Secondary | ICD-10-CM | POA: Diagnosis not present

## 2014-04-14 NOTE — Telephone Encounter (Signed)
Pt notified. Will discuss options on her next visit.

## 2014-04-14 NOTE — Telephone Encounter (Signed)
Bone density test from 03/24/2014 showed osteoporosis w/ a T score of -3.0. This is significantly different from previous bone density test. In addition to vitamin D supplements him to stay active I recommend medications. Options are:  Fosamax, Forteo a daily injection, reclast a yearly injection , prolia Advise patient to schedule a visit to discuss results and see if she likes to proceed with therapy.

## 2014-04-16 ENCOUNTER — Telehealth: Payer: Self-pay | Admitting: Internal Medicine

## 2014-04-16 MED ORDER — OMEPRAZOLE 20 MG PO CPDR: 20.0000 mg | DELAYED_RELEASE_CAPSULE | Freq: Two times a day (BID) | ORAL | Status: DC

## 2014-04-16 NOTE — Addendum Note (Signed)
Addended by: Larina Bras on: 04/16/2014 04:18 PM   Modules accepted: Orders

## 2014-04-16 NOTE — Telephone Encounter (Signed)
Patient states that her rx that the pharmacy gave her for prilosec was incorrect. It was for once daily and she should be on twice daily dosing. I advised patient that we have not received any correspondence from [facility] so another physician may have okayed a different amount as it was not [place]. Patient states that she was out of refills and [facility] called our office for the authorization. I have contacted [facility] to clarify confusion. I spoke to [place] who states that the patient had an old prescription on file for omeprazole once daily and that was filled in error rather than requesting a refill authorization for twice daily from our office. I have asked that the prescription for once daily be discontinued so this does not happen in the future. I have also asked that patient be given #60 tablets for 30 days rather than 30. They will provide this at no additional cost to patient. Patient verbalizes understanding. I will also okay additional refills when electronic refill request comes over.

## 2014-04-19 ENCOUNTER — Other Ambulatory Visit: Payer: Self-pay | Admitting: Internal Medicine

## 2014-04-23 DIAGNOSIS — IMO0002 Reserved for concepts with insufficient information to code with codable children: Secondary | ICD-10-CM | POA: Diagnosis not present

## 2014-04-23 DIAGNOSIS — M545 Low back pain, unspecified: Secondary | ICD-10-CM | POA: Diagnosis not present

## 2014-04-23 DIAGNOSIS — M47817 Spondylosis without myelopathy or radiculopathy, lumbosacral region: Secondary | ICD-10-CM | POA: Diagnosis not present

## 2014-04-23 DIAGNOSIS — M5137 Other intervertebral disc degeneration, lumbosacral region: Secondary | ICD-10-CM | POA: Diagnosis not present

## 2014-05-06 ENCOUNTER — Telehealth: Payer: Self-pay | Admitting: Internal Medicine

## 2014-05-06 MED ORDER — HYOSCYAMINE SULFATE 0.125 MG SL SUBL: 0.1250 mg | SUBLINGUAL_TABLET | SUBLINGUAL | Status: DC | PRN

## 2014-05-06 NOTE — Telephone Encounter (Signed)
I have seen her for this pain and she had a CT scan in 02/2014 which did not show any active process. She will have to see an extender. Please try LevsinSL.125 mg, #30, 1 SL q 4 hrs prn RLQ discomfort.

## 2014-05-06 NOTE — Telephone Encounter (Signed)
Patient reports continued RLQ abdominal pain.  The pain is a burning, twisting type pain with cramping.  She experiencing occasional constipation.  She states the pain has been present since the colonoscopy in April.  Patient's daughter is requesting she be seen with Dr. Olevia Perches ASAP.  Dr. Olevia Perches please advise you don't have any openings at this time

## 2014-05-06 NOTE — Telephone Encounter (Signed)
Daughter notified rx sent REV scheduled with Nicoletta Ba PA for 05/13/14

## 2014-05-13 ENCOUNTER — Ambulatory Visit (INDEPENDENT_AMBULATORY_CARE_PROVIDER_SITE_OTHER): Payer: Medicare Other | Admitting: Physician Assistant

## 2014-05-13 ENCOUNTER — Other Ambulatory Visit (INDEPENDENT_AMBULATORY_CARE_PROVIDER_SITE_OTHER): Payer: Medicare Other

## 2014-05-13 ENCOUNTER — Encounter: Payer: Self-pay | Admitting: Physician Assistant

## 2014-05-13 VITALS — BP 132/90 | HR 88 | Ht 63.0 in | Wt 200.1 lb

## 2014-05-13 DIAGNOSIS — G8929 Other chronic pain: Secondary | ICD-10-CM | POA: Diagnosis not present

## 2014-05-13 DIAGNOSIS — Z86010 Personal history of colonic polyps: Secondary | ICD-10-CM

## 2014-05-13 DIAGNOSIS — M545 Low back pain, unspecified: Secondary | ICD-10-CM

## 2014-05-13 DIAGNOSIS — R1031 Right lower quadrant pain: Secondary | ICD-10-CM | POA: Diagnosis not present

## 2014-05-13 DIAGNOSIS — I2510 Atherosclerotic heart disease of native coronary artery without angina pectoris: Secondary | ICD-10-CM | POA: Diagnosis not present

## 2014-05-13 LAB — CBC WITH DIFFERENTIAL/PLATELET
Basophils Absolute: 0 10*3/uL (ref 0.0–0.1)
Basophils Relative: 0.4 % (ref 0.0–3.0)
Eosinophils Absolute: 0.3 10*3/uL (ref 0.0–0.7)
Eosinophils Relative: 3.6 % (ref 0.0–5.0)
HCT: 40.8 % (ref 36.0–46.0)
Hemoglobin: 13.6 g/dL (ref 12.0–15.0)
Lymphocytes Relative: 28.1 % (ref 12.0–46.0)
Lymphs Abs: 2 10*3/uL (ref 0.7–4.0)
MCHC: 33.3 g/dL (ref 30.0–36.0)
MCV: 86.5 fl (ref 78.0–100.0)
Monocytes Absolute: 0.5 10*3/uL (ref 0.1–1.0)
Monocytes Relative: 7.4 % (ref 3.0–12.0)
Neutro Abs: 4.3 10*3/uL (ref 1.4–7.7)
Neutrophils Relative %: 60.5 % (ref 43.0–77.0)
Platelets: 219 10*3/uL (ref 150.0–400.0)
RBC: 4.72 Mil/uL (ref 3.87–5.11)
RDW: 14.8 % (ref 11.5–15.5)
WBC: 7.1 10*3/uL (ref 4.0–10.5)

## 2014-05-13 LAB — BASIC METABOLIC PANEL
BUN: 18 mg/dL (ref 6–23)
CO2: 27 mEq/L (ref 19–32)
Calcium: 10 mg/dL (ref 8.4–10.5)
Chloride: 103 mEq/L (ref 96–112)
Creatinine, Ser: 0.9 mg/dL (ref 0.4–1.2)
GFR: 67.12 mL/min (ref >60.00)
Glucose, Bld: 128 mg/dL — ABNORMAL HIGH (ref 70–99)
Potassium: 5.2 mEq/L — ABNORMAL HIGH (ref 3.5–5.1)
Sodium: 138 mEq/L (ref 135–145)

## 2014-05-13 LAB — SEDIMENTATION RATE: Sed Rate: 12 mm/hr (ref 0–22)

## 2014-05-13 NOTE — Patient Instructions (Signed)
Please go to the basement level to have your labs drawn.    You have been scheduled for a CT scan of the abdomen and pelvis at [facility] ([address]---this is in the same building as Press [facility]).   You are scheduled on 05-14-2014 at 1:30 PM . You should arrive at 1:15 PM  prior to your appointment time for registration. Please follow the written instructions below on the day of your exam:  WARNING: IF YOU ARE ALLERGIC TO IODINE/X-RAY DYE, PLEASE NOTIFY RADIOLOGY IMMEDIATELY AT [phone]! YOU WILL BE GIVEN A 13 HOUR PREMEDICATION PREP.  1) Do not eat or drink anything after 9:30 am  (4 hours prior to your test) 2) You have been given 2 bottles of oral contrast to drink. The solution may taste  better if refrigerated, but do NOT add ice or any other liquid to this solution. Shake well before drinking.    Drink 1 bottle of contrast @ 11:30 PM  (2 hours prior to your exam)  Drink 1 bottle of contrast @ 12:30 PM  (1 hour prior to your exam)  You may take any medications as prescribed with a small amount of water except for the following: Metformin, Glucophage, Glucovance, Avandamet, Riomet, Fortamet, Actoplus Met, Janumet, Glumetza or Metaglip. The above medications must be held the day of the exam AND 48 hours after the exam.  The purpose of you drinking the oral contrast is to aid in the visualization of your intestinal tract. The contrast solution may cause some diarrhea. Before your exam is started, you will be given a small amount of fluid to drink. Depending on your individual set of symptoms, you may also receive an intravenous injection of x-ray contrast/dye. Plan on being at [facility] for 30 minutes or long, depending on the type of exam you are having performed.  If you have any questions regarding your exam or if you need to reschedule, you may call the CT department at [phone] between the hours of 8:00 am and 5:00 pm,  Monday-Friday.  ________________________________________________________________________

## 2014-05-13 NOTE — Progress Notes (Signed)
Subjective:    Patient ID: [name], female    DOB: Jan 25, 1935, 79 y.o.   MRN: [id number]  HPI  [name] is a pleasant 79 year old white female known to [name]. She comes in today with complaints of persistent right lower quadrant pain. Patient had undergone colonoscopy in April of 2015 for followup of adenomatous polyps. She also has history of a carcinoma in situ and a polyp in 1994. 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 She was found to have a large sessile polyp in the cecum measuring around 2 cm which was removed piecemeal and incompletely removed.. She had 2 other sessile polyps as well. Biopsy from the cecal polyp showed a tubovillous adenoma without dysplasia in the descending colon polyps were tubular adenomas. [name] fell resection may be needed the patient's at this time had opted for one-year followup. Because of her complaints of right lower quadrant pain post procedure she underwent CT scan of the abdomen and pelvis on 01/28/2014 which showed masslike wall thickening involving the cecal tip there were 2 associated linear metallic density suggesting prior surgical procedure which were endoclips. She had subtle stranding in the adjacent mesenteric fat. She was seen back in the office after that with persistent complaints of pain and was given a course of Augmentin. She had followup CT scan on 02/23/2014 which showed postsurgical changes involving the cecum, suspected appendix is normal  Mild wall thickening of the transverse colon hepatic flexure versus under distention and diverticulosis without inflammatory changes. She comes back in today stating that she still having pain which is in may be a little bit worse than it  was a few weeks ago. She says she has a constant lower level pain and has intermittent more sharp severe pain off and on through the day. She also has chronic lumbar back pain and  she says sometimes difficult for her to separate the two. Her appetite has been fine her weight is stable she's not having any fevers or chills. She has irregular bowel habits but has not noted any recent changes, she has no change in her abdominal pain with by mouth intake or bowel movements. She cannot tell me that there are any positional changes either other than lying in a fetal position seems to ease  the right lower quadrant pain.  She mentions that she has a followup appointment with her orthopedist [name] next month and a repeat MRI has been mentioned    Review of Systems  Constitutional: Negative.   HENT: Negative.   Eyes: Negative.   Respiratory: Negative.   Cardiovascular: Negative.   Gastrointestinal: Positive for abdominal pain.  Endocrine: Negative.   Genitourinary: Negative.   Musculoskeletal: Positive for arthralgias, back pain and gait problem.  Skin: Negative.   Allergic/Immunologic: Negative.   Neurological: Negative.   Hematological: Negative.   Psychiatric/Behavioral: Negative.    Outpatient Prescriptions Prior to Visit  Medication Sig Dispense Refill  . acetaminophen (TYLENOL) 500 MG tablet Take 500 mg by mouth daily as needed for mild pain or moderate pain.       . Alpha-Lipoic Acid 600 MG CAPS Take 600 mg by mouth 2 (two) times daily.      Marland Kitchen amLODipine-benazepril (LOTREL) 5-10 MG per capsule take 1 capsule by mouth once daily  30 capsule  6  . aspirin EC 81 MG tablet Take 1 tablet (81 mg total) by mouth daily.      Marland Kitchen  Boron 3 MG CAPS Take 3 mg by mouth every evening.       . Cholecalciferol (VITAMIN D3) 5000 UNITS CAPS Take 5,000 Units by mouth every evening.      . Cyanocobalamin (VITAMIN B-12) 2500 MCG SUBL Place 2,500 mcg under the tongue daily.      . diclofenac sodium (VOLTAREN) 1 % GEL Apply 1 application topically daily as needed (pain).      . hydrochlorothiazide (MICROZIDE) 12.5 MG capsule Take 12.5 mg by mouth daily.      Marland Kitchen  HYDROcodone-acetaminophen (NORCO) 10-325 MG per tablet Take 1 tablet by mouth every 6 (six) hours as needed for moderate pain or severe pain.  120 tablet  0  . hydrocortisone (ANUSOL-HC) 25 MG suppository Place 25 mg rectally 2 (two) times daily as needed for hemorrhoids.       . hyoscyamine (LEVSIN/SL) 0.125 MG SL tablet Place 1 tablet (0.125 mg total) under the tongue every 4 (four) hours as needed.  30 tablet  1  . Krill Oil 300 MG CAPS Take 300 mg by mouth every evening.       . meclizine (ANTIVERT) 12.5 MG tablet take 1 tablet by mouth three times a day if needed  90 tablet  1  . metoprolol succinate (TOPROL-XL) 25 MG 24 hr tablet Take 1 tablet (25 mg total) by mouth daily.  30 tablet  6  . Multiple Vitamins-Minerals (PRESERVISION AREDS 2 PO) Take 2 tablets by mouth daily.      . nabumetone (RELAFEN) 750 MG tablet Take 750 mg by mouth 2 (two) times daily.      . nitroGLYCERIN (NITROSTAT) 0.4 MG SL tablet Place 1 tablet (0.4 mg total) under the tongue every 5 (five) minutes as needed for chest pain.  90 tablet  3  . omeprazole (PRILOSEC) 20 MG capsule Take 1 capsule (20 mg total) by mouth 2 (two) times daily.  180 capsule  1  . OVER THE COUNTER MEDICATION Take 400 mg by mouth 2 (two) times daily. tumeric      . OVER THE COUNTER MEDICATION Take 1 capsule by mouth 2 (two) times daily. Integrative Digestive Formula      . Polyvinyl Alcohol-Povidone (REFRESH OP) Place 1 drop into both eyes daily as needed (dry eyes).      . Probiotic Product (PROBIOTIC DAILY PO) Take 1 capsule by mouth daily.      . hyoscyamine (LEVSIN SL) 0.125 MG SL tablet Place 1 tablet (0.125 mg total) under the tongue every 4 (four) hours as needed for cramping.  90 tablet  3   No facility-administered medications prior to visit.   Allergies  Allergen Reactions  . Cymbalta [Duloxetine Hcl] Swelling    Swelling in legs  . Gabapentin Swelling    Swelling in legs  . Adhesive [Tape]     rash  . Cefazolin Hives  .  Ciprofloxacin Other (See Comments)    Per pt, caused body aches   . Clorazepate Dipotassium Other (See Comments)    Unknown reaction  . Darifenacin Hydrobromide Other (See Comments)    hypotension, near syncope  . Dilaudid [Hydromorphone Hcl] Other (See Comments)     confused, intense itching  . Enablex [Darifenacin Hydrobromide Er] Other (See Comments)    Hypotension, near syncope  . Levofloxacin     Causes wrist pain  . Lyrica [Pregabalin] Swelling    Swelling in legs  . Methadone Hcl Other (See Comments)    Reaction unknown  . Morphine  And Related Other (See Comments)    Confusion   . Pentazocine Lactate Other (See Comments)    Altered mental state, "climbing walls", anxiety  . Talwin [Pentazocine] Other (See Comments)    "climbing walls" anxiety  . Doxycycline Rash   Patient Active Problem List   Diagnosis Date Noted  . 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 Colitis 01/28/2014  . Anemia 01/18/2014  . Nonspecific abnormal unspecified cardiovascular function study 10/31/2013  . Accelerating angina 10/31/2013  . Rectal bleeding 10/31/2013  . Hemorrhoids 10/31/2013  . CAD (coronary artery disease)   . Carcinoma in situ in a polyp   . Depression 09/17/2013  . Charcot's joint of right foot S/P Reconstruction 08/21/2013  . Osteoarthritis 08/21/2013  . Foot osteomyelitis, right 07/16/2013  . Extrinsic asthma, unspecified 07/16/2013  . GERD (gastroesophageal reflux disease)   . Syncope   . Neuropathy   . Hyperthyroidism   . Lower extremity edema 03/08/2011  . ALLERGIC RHINITIS 04/15/2010  . ORTHOSTATIC DIZZINESS 04/13/2010  . HIATAL HERNIA 12/06/2009  . GASTRIC ULCER, HX OF 12/06/2009  . DYSPNEA 03/18/2009  . UTI'S, RECURRENT 07/07/2008  . DEGENERATIVE JOINT DISEASE 06/03/2008  . COLONIC POLYPS 01/27/2008  . DIVERTICULOSIS, COLON 01/27/2008  . IRRITABLE BOWEL SYNDROME, HX OF 01/27/2008  . PULMONARY NODULE 01/22/2008  . HYPERLIPIDEMIA  12/30/2007  . Osteopenia 12/30/2007  . PE (pulmonary embolism) 12/22/2007  . TB SKIN TEST, POSITIVE 08/09/2007  . DIABETES MELLITUS, TYPE II 05/09/2007  . REFLEX SYMPATHETIC DYSTROPHY, PAIN MGMT 05/09/2007  . HYPERTENSION 04/15/2007  . GERD 04/15/2007   History  Substance Use Topics  . Smoking status: Never Smoker   . Smokeless tobacco: Never Used  . Alcohol Use: No   family history includes Breast cancer in an other family member; Diabetic kidney disease in her daughter; Headache in her sister; Heart disease in her mother; Hypertension in an other family member; Migraines in her sister; Rheum arthritis in her mother. There is no history of Colon cancer, Esophageal cancer, Stomach cancer, or Rectal cancer.     Objective:   Physical Exam  well-developed elderly white female in no acute distress, pleasant blood pressure 132/90 pulse 88 height 5 foot 3 weight 200. HEENT ;nontraumatic normocephalic EOMI PERRLA sclera anicteric, Supple; no JVD, Cardiovascular; regular rate and rhythm with S1-S2 no murmur or gallop, Pulm; clear bilaterally, Abdomen ;soft, she is quite tender in the right lower quadrant right mid quadrant there is no guarding or rebound no palpable mass or hepatosplenomegaly bowel sounds are present, Rectal; exam not done, Extremities ;no clubbing cyanosis or edema she does have a brace on her left foot, Psych; mood and affect appropriate        Ass essment & Plan:  #3 70 -year-old female with persistent right lower quadrant pain x3 months status post partial remove meds of a large cecal polyp in April 2015. Path consistent with a tubovillous adenoma. Patient did have a proven post polypectomy inflammatory process which had resolved by followup CT in May 2015 however pain is persisting Will rule out right lower quadrant inflammatory process, complication of polypectomy though seems unlikely this far out from procedure, chronic appendicitis etc. Also need to consider a  radiculopathy with known multilevel lumbar disc disease. #2 large tubovillous adenoma of the cecum partially removed and one other large tubular adenoma on colonoscopy April 2015 plan is for one year interval followup  Plan; CBC with differential, and sedimentation rate Schedule for CT of the abdomen and  pelvis-further plans pending findings of above. She will use hyoscyamine as needed for cramping though at this point not certain it is helping much Patient is encouraged to keep her followup appointment with orthopedics

## 2014-05-14 ENCOUNTER — Ambulatory Visit (INDEPENDENT_AMBULATORY_CARE_PROVIDER_SITE_OTHER)
Admission: RE | Admit: 2014-05-14 | Discharge: 2014-05-14 | Disposition: A | Discharge status: Home / Self Care | Payer: Medicare Other | Source: Ambulatory Visit | Attending: Physician Assistant | Admitting: Physician Assistant

## 2014-05-14 DIAGNOSIS — R1031 Right lower quadrant pain: Secondary | ICD-10-CM

## 2014-05-14 MED ORDER — IOHEXOL 300 MG/ML  SOLN
100.0000 mL | Freq: Once | INTRAMUSCULAR | Status: AC | PRN
  Administered 2014-05-14: 100 mL via INTRAVENOUS

## 2014-05-14 NOTE — Progress Notes (Signed)
Reviewed, if the pain is tryly from her colon then we may consider  Lap assisted cecal polyp resection which was an option to start with considering size of the polyp and risk of repeating "postpolypectomy" syndrome.

## 2014-05-18 ENCOUNTER — Other Ambulatory Visit: Payer: Self-pay

## 2014-05-18 DIAGNOSIS — R109 Unspecified abdominal pain: Secondary | ICD-10-CM

## 2014-05-19 ENCOUNTER — Ambulatory Visit: Payer: Medicare Other | Admitting: Internal Medicine

## 2014-05-19 ENCOUNTER — Other Ambulatory Visit (INDEPENDENT_AMBULATORY_CARE_PROVIDER_SITE_OTHER): Payer: Medicare Other

## 2014-05-19 DIAGNOSIS — R109 Unspecified abdominal pain: Secondary | ICD-10-CM | POA: Diagnosis not present

## 2014-05-19 LAB — URINALYSIS
Bilirubin Urine: NEGATIVE
Hgb urine dipstick: NEGATIVE
Ketones, ur: NEGATIVE
Leukocytes, UA: NEGATIVE
Nitrite: NEGATIVE
Specific Gravity, Urine: 1.02 (ref 1.000–1.030)
Total Protein, Urine: NEGATIVE
Urine Glucose: NEGATIVE
Urobilinogen, UA: 0.2 (ref 0.0–1.0)
pH: 5.5 (ref 5.0–8.0)

## 2014-05-20 ENCOUNTER — Other Ambulatory Visit: Payer: Self-pay | Admitting: Rehabilitation

## 2014-05-20 DIAGNOSIS — M545 Low back pain, unspecified: Secondary | ICD-10-CM

## 2014-05-21 ENCOUNTER — Other Ambulatory Visit: Payer: Self-pay | Admitting: *Deleted

## 2014-05-21 DIAGNOSIS — N390 Urinary tract infection, site not specified: Secondary | ICD-10-CM

## 2014-05-21 MED ORDER — SULFAMETHOXAZOLE-TRIMETHOPRIM 400-80 MG PO TABS: 1.0000 | ORAL_TABLET | Freq: Two times a day (BID) | ORAL | Status: DC

## 2014-05-22 LAB — URINE CULTURE: Colony Count: >=100000

## 2014-05-26 DIAGNOSIS — M1710 Unilateral primary osteoarthritis, unspecified knee: Secondary | ICD-10-CM | POA: Diagnosis not present

## 2014-05-26 DIAGNOSIS — M654 Radial styloid tenosynovitis [de Quervain]: Secondary | ICD-10-CM | POA: Diagnosis not present

## 2014-05-27 ENCOUNTER — Ambulatory Visit: Payer: Medicare Other | Admitting: Internal Medicine

## 2014-05-28 ENCOUNTER — Ambulatory Visit
Admission: RE | Admit: 2014-05-28 | Discharge: 2014-05-28 | Disposition: A | Discharge status: Home / Self Care | Payer: Medicare Other | Source: Ambulatory Visit | Attending: Rehabilitation | Admitting: Rehabilitation

## 2014-05-28 DIAGNOSIS — M48061 Spinal stenosis, lumbar region without neurogenic claudication: Secondary | ICD-10-CM | POA: Diagnosis not present

## 2014-05-28 DIAGNOSIS — M47817 Spondylosis without myelopathy or radiculopathy, lumbosacral region: Secondary | ICD-10-CM | POA: Diagnosis not present

## 2014-05-28 DIAGNOSIS — M545 Low back pain, unspecified: Secondary | ICD-10-CM

## 2014-05-28 DIAGNOSIS — M5126 Other intervertebral disc displacement, lumbar region: Secondary | ICD-10-CM | POA: Diagnosis not present

## 2014-06-01 ENCOUNTER — Other Ambulatory Visit (INDEPENDENT_AMBULATORY_CARE_PROVIDER_SITE_OTHER): Payer: Medicare Other

## 2014-06-01 DIAGNOSIS — N390 Urinary tract infection, site not specified: Secondary | ICD-10-CM

## 2014-06-01 LAB — URINALYSIS
Bilirubin Urine: NEGATIVE
Hgb urine dipstick: NEGATIVE
Ketones, ur: NEGATIVE
Leukocytes, UA: NEGATIVE
Nitrite: NEGATIVE
Specific Gravity, Urine: 1.02 (ref 1.000–1.030)
Total Protein, Urine: NEGATIVE
Urine Glucose: NEGATIVE
Urobilinogen, UA: 0.2 (ref 0.0–1.0)
pH: 6 (ref 5.0–8.0)

## 2014-06-02 DIAGNOSIS — M5137 Other intervertebral disc degeneration, lumbosacral region: Secondary | ICD-10-CM | POA: Diagnosis not present

## 2014-06-03 DIAGNOSIS — N3020 Other chronic cystitis without hematuria: Secondary | ICD-10-CM | POA: Diagnosis not present

## 2014-06-03 DIAGNOSIS — R1031 Right lower quadrant pain: Secondary | ICD-10-CM | POA: Diagnosis not present

## 2014-06-04 ENCOUNTER — Encounter: Payer: Self-pay | Admitting: Internal Medicine

## 2014-06-04 ENCOUNTER — Ambulatory Visit (INDEPENDENT_AMBULATORY_CARE_PROVIDER_SITE_OTHER): Payer: Medicare Other | Admitting: Internal Medicine

## 2014-06-04 VITALS — BP 145/80 | HR 76 | Temp 97.8°F | Wt 199.2 lb

## 2014-06-04 DIAGNOSIS — I2510 Atherosclerotic heart disease of native coronary artery without angina pectoris: Secondary | ICD-10-CM

## 2014-06-04 DIAGNOSIS — M810 Age-related osteoporosis without current pathological fracture: Secondary | ICD-10-CM

## 2014-06-04 DIAGNOSIS — I10 Essential (primary) hypertension: Secondary | ICD-10-CM | POA: Diagnosis not present

## 2014-06-04 DIAGNOSIS — G9050 Complex regional pain syndrome I, unspecified: Secondary | ICD-10-CM

## 2014-06-04 DIAGNOSIS — E119 Type 2 diabetes mellitus without complications: Secondary | ICD-10-CM | POA: Diagnosis not present

## 2014-06-04 DIAGNOSIS — Z79899 Other long term (current) drug therapy: Secondary | ICD-10-CM | POA: Diagnosis not present

## 2014-06-04 LAB — HEMOGLOBIN A1C: Hgb A1c MFr Bld: 6.7 % — ABNORMAL HIGH (ref 4.6–6.5)

## 2014-06-04 MED ORDER — HYDROCODONE-ACETAMINOPHEN 10-325 MG PO TABS: 1.0000 | ORAL_TABLET | Freq: Four times a day (QID) | ORAL | Status: DC | PRN

## 2014-06-04 NOTE — Progress Notes (Signed)
Pre-visit discussion using our clinic review tool. No additional management support is needed unless otherwise documented below in the visit note.  

## 2014-06-04 NOTE — Assessment & Plan Note (Signed)
Hypertension, good medication compliance, BP well-controlled.

## 2014-06-04 NOTE — Progress Notes (Signed)
Subjective:    Patient ID: [name], female    DOB: October 19, 1935, 79 y.o.   MRN: [id number]  DOS:  06/04/2014 Type of visit - description: routine History: Having persistent abdominal pain after a colonoscopy, saw GI, CT was negative except for some air in the bladder, subsequently was diagnosed with a UTI, took antibiotics. Went to see urology yesterday.  Hypertension, good medication compliance, BP today is very good Diabetes, chart reviewed, due for A1c Osteoporosis, we needed to discuss treatment options Pain management, request a refill.    ROS Denies fever chills No recent chest pain or difficulty breathing No nausea, vomiting, diarrhea or blood in the stools  Past Medical History  Diagnosis Date  . Hyperlipidemia     a. patient unwilling to use statins.  . Osteoporosis   . DVT (deep venous thrombosis) 12/2007  . PE (pulmonary embolism) 12/2007    a. PE/DVT after neck surgery 2009. b. coumadin d/c 10-2008.  . Pulmonary nodule     incidental per CT:  Pet scan 4-9: likely benign, CT 08-2009 no change, no further CTs (Dr. Gwenette Greet)  . Hemorrhoids   . IBS (irritable bowel syndrome)   . Diverticulosis   . PPD positive   . Macular degeneration 03/2009    Dr. Rosana Hoes  . Allergic rhinitis   . Dyspnea     a. Chronic, extensive w/u see OV note 09-2010. b. [place] 2011: ormal with RA 5 RV 31/2 PA 27/12 (19) PCW 10 CO normal. No evidence of shunting with sitting up in cath lab. c. CPX 2011: see report. d. Prior fluoro of diaphragm -  R diaphragm elevated at rest but both moved with inspiration.   Marland Kitchen Spinal stenosis   . GERD (gastroesophageal reflux disease)     a. Hx GERD/esophageal dysmotility followed by Dr. Olevia Perches.   Marland Kitchen Hypertension   . Neuropathy     a. Hands, feet, legs.  . Orthostatic hypotension   . CAD (coronary artery disease)     a. Nonobst in 2011. b. Abnormal nuc 09/2013 -> s/p cutting balloon to D2; mild LAD disease 10/31/13.  . Osteomyelitis     a. Adm 04/2013: Charcot  collapse of the right foot with osteomyelitis and ulceration, s/p excision.  . Venous insufficiency     a. Contributing to LEE.  . Type II diabetes mellitus   . DJD (degenerative joint disease)   . Arthritis     "back; ankles; hands; knees" (10/31/2013)  . Recurrent UTI   . 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 1994 - malignant polyp removed during colonoscopy.  . Hiatal hernia     a. s/p Nissen fundoplication 9628.  Marland Kitchen RSD (reflex sympathetic dystrophy)     a. Chronic pain.  . Abnormal echocardiogram     09/2013: mild LVH, mild focal basal hypertrophy of septum, EF 60-65%, normal WM, grade 1 diastolic dysfunction, MAC, mild LAE, ASA, PASP 34. Possible oscillating MV density - reviewed by MD and felt there was no significant abnormality other than MAC noted with mitral valve and did not require TEE.  . Adenomatous colon polyp   . Pneumonia     Past Surgical History  Procedure Laterality Date  . Nissen fundoplication  12/26/6292  . Cholecystectomy  03/2002  . Cataract extraction w/ intraocular lens  implant, bilateral  2004    feb 2004 left, aug 2004 right  . Rotator cuff repair Right 07/2007    Dr. Percell Miller  . Anterior  cervical decomp/discectomy fusion  12/18/07    For OA,  Dr. Lorin Mercy:  fu by a PE  . Toe amputation Right 08/2008    3rd toe, Dr. Sharol Given due to osteomyelitis  . Nasal septum surgery  09/1963  . Vein ligation Bilateral 03/1966  . Carpal tunnel release Left 09/2011  . Amputation  03/06/2012    Procedure: AMPUTATION FOOT;  Surgeon: Newt Minion, MD;  Location: [facility];  Service: Orthopedics;  Laterality: Left;  FIFTH RAY AMPUTATION   . Vena cava filter placement  01/2010    green filter; "due to blood clots"  . Ankle fusion  09/27/2012    Procedure: ANKLE FUSION;  Surgeon: Newt Minion, MD;  Location: [facility];  Service: Orthopedics;  Laterality: Left;  Left Tibiocalcaneal Fusion  . Ankle fusion Right 05/09/2013    Procedure: ANKLE FUSION;  Surgeon: Newt Minion, MD;  Location: [place];  Service: Orthopedics;  Laterality: Right;  Excision Osteomyelitis Base 1st MT Right Foot, Fusion Medial Column  . Cardiac catheterization  05/2010     at [facility]  . Coronary angioplasty  10/31/2013  . Vaginal hysterectomy  03/1975  . Ankle surgery Left apr & june 1991  . Cyst removal hand  06/2003  . Rotator cuff repair Left 11/2004  . Wisdom tooth extraction  06/2007  . Foot bone excision Right 06/2009    History   Social History  . Marital Status: Divorced    Spouse Name: N/A    Number of Children: 3  . Years of Education: N/A   Occupational History  . Retired      from [employer]    Social History Main Topics  . Smoking status: Never Smoker   . Smokeless tobacco: Never Used  . Alcohol Use: No  . Drug Use: No  . Sexual Activity: No   Other Topics Concern  . Not on file   Social History Narrative   1 daughter lives w/ her , another daughter lives in town, 1 son in [place]                  Medication List       This list is accurate as of: 06/04/14 10:50 AM.  Always use your most recent med list.               acetaminophen 500 MG tablet  Commonly known as:  TYLENOL  Take 500 mg by mouth daily as needed for mild pain or moderate pain.     Alpha-Lipoic Acid 600 MG Caps  Take 600 mg by mouth 2 (two) times daily.     amLODipine-benazepril 5-10 MG per capsule  Commonly known as:  LOTREL  take 1 capsule by mouth once daily     aspirin EC 81 MG tablet  Take 1 tablet (81 mg total) by mouth daily.     Boron 3 MG Caps  Take 3 mg by mouth every evening.     diclofenac sodium 1 % Gel  Commonly known as:  VOLTAREN  Apply 1 application topically daily as needed (pain).     hydrochlorothiazide 12.5 MG capsule  Commonly known as:  MICROZIDE  Take 12.5 mg by mouth daily.     HYDROcodone-acetaminophen 10-325 MG per tablet  Commonly known as:  NORCO  Take 1 tablet by mouth every 6 (six) hours as needed for moderate pain or severe pain.     hydrocortisone  25 MG suppository  Commonly known as:  ANUSOL-HC  Place 25 mg rectally 2 (two) times daily as needed for hemorrhoids.     hyoscyamine 0.125 MG SL tablet  Commonly known as:  LEVSIN/SL  Place 1 tablet (0.125 mg total) under the tongue every 4 (four) hours as needed.     Krill Oil 300 MG Caps  Take 300 mg by mouth every evening.     meclizine 12.5 MG tablet  Commonly known as:  ANTIVERT  take 1 tablet by mouth three times a day if needed     metoprolol succinate 25 MG 24 hr tablet  Commonly known as:  TOPROL-XL  Take 1 tablet (25 mg total) by mouth daily.     nabumetone 750 MG tablet  Commonly known as:  RELAFEN  Take 750 mg by mouth 2 (two) times daily.     nitroGLYCERIN 0.4 MG SL tablet  Commonly known as:  NITROSTAT  Place 1 tablet (0.4 mg total) under the tongue every 5 (five) minutes as needed for chest pain.     nortriptyline 10 MG capsule  Commonly known as:  PAMELOR  Take 10 mg by mouth daily.     omeprazole 20 MG capsule  Commonly known as:  PRILOSEC  Take 1 capsule (20 mg total) by mouth 2 (two) times daily.     OVER THE COUNTER MEDICATION  Take 400 mg by mouth 2 (two) times daily. tumeric     OVER THE COUNTER MEDICATION  Take 1 capsule by mouth 2 (two) times daily. Integrative Digestive Formula     PRESERVISION AREDS 2 PO  Take 2 tablets by mouth daily.     PROBIOTIC DAILY PO  Take 1 capsule by mouth daily.     REFRESH OP  Place 1 drop into both eyes daily as needed (dry eyes).     sulfamethoxazole-trimethoprim 400-80 MG per tablet  Commonly known as:  BACTRIM  Take 1 tablet by mouth 2 (two) times daily.     Vitamin B-12 2500 MCG Subl  Place 2,500 mcg under the tongue daily.     Vitamin D3 5000 UNITS Caps  Take 5,000 Units by mouth every evening.           Objective:   Physical Exam BP 145/80  Pulse 76  Temp(Src) 97.8 F (36.6 C) (Oral)  Wt 199 lb 4 oz (90.379 kg)  SpO2 99%  General -- alert, well-developed, NAD.   Lungs -- normal  respiratory effort, no intercostal retractions, no accessory muscle use, and normal breath sounds.  Heart-- normal rate, regular rhythm, no murmur.   Extremities-- trace  pretibial edema bilaterally  Neurologic--  alert & oriented X3.  Gait at baseline Psych-- Cognition and judgment appear intact. Cooperative with normal attention span and concentration. No anxious or depressed appearing.       Assessment & Plan:   Recent urine culture + , was treated, saw urology yesterday.

## 2014-06-04 NOTE — Assessment & Plan Note (Signed)
Osteoporosis: Bone density test 03/2014 showed a T score of - 3.0, that is significantly worse than previous bone density test. 2 family members had a severe reaction to Fosamax so she make her mind she won't take any Fosamax or Fosamax-like medications. In addition to calcium, vitamin D and I recommended to think about Forteo and prolia. She will let me know her final decision.

## 2014-06-04 NOTE — Assessment & Plan Note (Signed)
  RSD, symptoms relatively well control, refill hydrocodone, uds

## 2014-06-04 NOTE — Assessment & Plan Note (Signed)
Diabetes, due for A1c

## 2014-06-04 NOTE — Patient Instructions (Signed)
Get your blood work before you leave  Also needs a UDS   Next visit is for a physical exam in 3-4 months ,  fasting Please make an appointment

## 2014-06-08 ENCOUNTER — Emergency Department (HOSPITAL_COMMUNITY)
Admission: EM | Admit: 2014-06-08 | Discharge: 2014-06-09 | Disposition: A | Discharge status: Home / Self Care | Payer: Medicare Other | Attending: Emergency Medicine | Admitting: Emergency Medicine

## 2014-06-08 ENCOUNTER — Encounter (HOSPITAL_COMMUNITY): Payer: Self-pay | Admitting: Emergency Medicine

## 2014-06-08 ENCOUNTER — Telehealth: Payer: Self-pay

## 2014-06-08 ENCOUNTER — Telehealth: Payer: Self-pay | Admitting: Internal Medicine

## 2014-06-08 DIAGNOSIS — I10 Essential (primary) hypertension: Secondary | ICD-10-CM | POA: Diagnosis not present

## 2014-06-08 DIAGNOSIS — IMO0002 Reserved for concepts with insufficient information to code with codable children: Secondary | ICD-10-CM | POA: Diagnosis not present

## 2014-06-08 DIAGNOSIS — K5900 Constipation, unspecified: Secondary | ICD-10-CM | POA: Insufficient documentation

## 2014-06-08 DIAGNOSIS — R1031 Right lower quadrant pain: Secondary | ICD-10-CM | POA: Diagnosis not present

## 2014-06-08 DIAGNOSIS — Z860100 Personal history of colon polyps, unspecified: Secondary | ICD-10-CM | POA: Diagnosis not present

## 2014-06-08 DIAGNOSIS — K219 Gastro-esophageal reflux disease without esophagitis: Secondary | ICD-10-CM | POA: Diagnosis not present

## 2014-06-08 DIAGNOSIS — Z79899 Other long term (current) drug therapy: Secondary | ICD-10-CM | POA: Insufficient documentation

## 2014-06-08 DIAGNOSIS — IMO0001 Reserved for inherently not codable concepts without codable children: Secondary | ICD-10-CM | POA: Insufficient documentation

## 2014-06-08 DIAGNOSIS — K589 Irritable bowel syndrome without diarrhea: Secondary | ICD-10-CM | POA: Diagnosis not present

## 2014-06-08 DIAGNOSIS — M810 Age-related osteoporosis without current pathological fracture: Secondary | ICD-10-CM | POA: Insufficient documentation

## 2014-06-08 DIAGNOSIS — E119 Type 2 diabetes mellitus without complications: Secondary | ICD-10-CM | POA: Insufficient documentation

## 2014-06-08 DIAGNOSIS — I2510 Atherosclerotic heart disease of native coronary artery without angina pectoris: Secondary | ICD-10-CM | POA: Insufficient documentation

## 2014-06-08 DIAGNOSIS — Z86711 Personal history of pulmonary embolism: Secondary | ICD-10-CM | POA: Diagnosis not present

## 2014-06-08 DIAGNOSIS — Z9889 Other specified postprocedural states: Secondary | ICD-10-CM | POA: Diagnosis not present

## 2014-06-08 DIAGNOSIS — M129 Arthropathy, unspecified: Secondary | ICD-10-CM | POA: Diagnosis not present

## 2014-06-08 DIAGNOSIS — R110 Nausea: Secondary | ICD-10-CM | POA: Insufficient documentation

## 2014-06-08 DIAGNOSIS — Z8669 Personal history of other diseases of the nervous system and sense organs: Secondary | ICD-10-CM | POA: Diagnosis not present

## 2014-06-08 DIAGNOSIS — Z8701 Personal history of pneumonia (recurrent): Secondary | ICD-10-CM | POA: Diagnosis not present

## 2014-06-08 DIAGNOSIS — Z87440 Personal history of urinary (tract) infections: Secondary | ICD-10-CM | POA: Diagnosis not present

## 2014-06-08 DIAGNOSIS — R109 Unspecified abdominal pain: Secondary | ICD-10-CM | POA: Insufficient documentation

## 2014-06-08 DIAGNOSIS — Z86718 Personal history of other venous thrombosis and embolism: Secondary | ICD-10-CM | POA: Insufficient documentation

## 2014-06-08 DIAGNOSIS — K7689 Other specified diseases of liver: Secondary | ICD-10-CM | POA: Diagnosis not present

## 2014-06-08 LAB — URINALYSIS, ROUTINE W REFLEX MICROSCOPIC
Bilirubin Urine: NEGATIVE
Glucose, UA: NEGATIVE mg/dL
Hgb urine dipstick: NEGATIVE
Ketones, ur: NEGATIVE mg/dL
Nitrite: NEGATIVE
Protein, ur: NEGATIVE mg/dL
Specific Gravity, Urine: 1.023 (ref 1.005–1.030)
Urobilinogen, UA: 0.2 mg/dL (ref 0.0–1.0)
pH: 5.5 (ref 5.0–8.0)

## 2014-06-08 LAB — CBC WITH DIFFERENTIAL/PLATELET
Basophils Absolute: 0 10*3/uL (ref 0.0–0.1)
Basophils Relative: 0 % (ref 0–1)
Eosinophils Absolute: 0.2 10*3/uL (ref 0.0–0.7)
Eosinophils Relative: 3 % (ref 0–5)
HCT: 39.9 % (ref 36.0–46.0)
Hemoglobin: 12.6 g/dL (ref 12.0–15.0)
Lymphocytes Relative: 39 % (ref 12–46)
Lymphs Abs: 2.3 10*3/uL (ref 0.7–4.0)
MCH: 28.3 pg (ref 26.0–34.0)
MCHC: 31.6 g/dL (ref 30.0–36.0)
MCV: 89.7 fL (ref 78.0–100.0)
Monocytes Absolute: 0.3 10*3/uL (ref 0.1–1.0)
Monocytes Relative: 5 % (ref 3–12)
Neutro Abs: 3.1 10*3/uL (ref 1.7–7.7)
Neutrophils Relative %: 53 % (ref 43–77)
Platelets: 195 10*3/uL (ref 150–400)
RBC: 4.45 MIL/uL (ref 3.87–5.11)
RDW: 14.2 % (ref 11.5–15.5)
WBC: 5.9 10*3/uL (ref 4.0–10.5)

## 2014-06-08 LAB — COMPREHENSIVE METABOLIC PANEL
ALT: 12 U/L (ref 0–35)
AST: 16 U/L (ref 0–37)
Albumin: 4 g/dL (ref 3.5–5.2)
Alkaline Phosphatase: 76 U/L (ref 39–117)
Anion gap: 13 (ref 5–15)
BUN: 27 mg/dL — ABNORMAL HIGH (ref 6–23)
CO2: 22 mEq/L (ref 19–32)
Calcium: 9.6 mg/dL (ref 8.4–10.5)
Chloride: 106 mEq/L (ref 96–112)
Creatinine, Ser: 0.66 mg/dL (ref 0.50–1.10)
GFR calc Af Amer: >90 mL/min (ref >90)
GFR calc non Af Amer: 84 mL/min — ABNORMAL LOW (ref >90)
Glucose, Bld: 122 mg/dL — ABNORMAL HIGH (ref 70–99)
Potassium: 4.5 mEq/L (ref 3.7–5.3)
Sodium: 141 mEq/L (ref 137–147)
Total Bilirubin: 0.3 mg/dL (ref 0.3–1.2)
Total Protein: 7.2 g/dL (ref 6.0–8.3)

## 2014-06-08 LAB — URINE MICROSCOPIC-ADD ON

## 2014-06-08 MED ORDER — ONDANSETRON HCL 4 MG/2ML IJ SOLN
4.0000 mg | INTRAMUSCULAR | Status: AC
  Administered 2014-06-08: 4 mg via INTRAVENOUS
  Filled 2014-06-08: qty 2

## 2014-06-08 MED ORDER — SODIUM CHLORIDE 0.9 % IV BOLUS (SEPSIS)
1000.0000 mL | Freq: Once | INTRAVENOUS | Status: AC
  Administered 2014-06-08: 1000 mL via INTRAVENOUS

## 2014-06-08 MED ORDER — IOHEXOL 300 MG/ML  SOLN
25.0000 mL | INTRAMUSCULAR | Status: AC
  Administered 2014-06-08: 25 mL via ORAL

## 2014-06-08 MED ORDER — FENTANYL CITRATE 0.05 MG/ML IJ SOLN
50.0000 ug | Freq: Once | INTRAMUSCULAR | Status: AC
  Administered 2014-06-08: 50 ug via INTRAVENOUS
  Filled 2014-06-08: qty 2

## 2014-06-08 NOTE — Telephone Encounter (Signed)
Caller name:Julliana Relation to pt: Call back number: Pharmacy:  Reason for call: Briteny called to say she had finished her antibiotic she was taking from Dr Ferrel Logan last weekend and she is now hurting in her stomach, around her appendix area and all over, she feels she needs another antibiotic. When i went to make her an appointment to see Dr Larose Kells, the first thing we had was Wednesday, she did not feel like she wanted to wait till then, she was wanting to be seen today. So she decided to go to an Urgent Care or call back first thing in the morning to see if we had something available.

## 2014-06-08 NOTE — ED Notes (Signed)
Pt reports having colonoscopy done in April, had large number of polyps removed but having RLQ pain since then. Pt has been to pcp and had ct scan done, was recently treated with antibiotics with temporarily helped her feel better. Denies n/v/d but reports having generalized bodyaches.

## 2014-06-08 NOTE — Telephone Encounter (Signed)
Spoke with patient and she completed the antibiotics that she was given for UTI about a week and 1/2 ago. She states she did not have any pain when she finished the antibiotics. She is calling to report she is now hurting all over. States she has pain in legs, hands and "my whole body." Patient instructed to call her PCP due to pain everywhere.

## 2014-06-08 NOTE — ED Provider Notes (Signed)
CSN: [id number]     Arrival date & time 06/08/14  1747 History   First MD Initiated Contact with Patient 06/08/14 2233     Chief Complaint  Patient presents with  . Abdominal Pain     (Consider location/radiation/quality/duration/timing/severity/associated sxs/prior Treatment) HPI Comments: Patient is a 79 year old female who presents to the emergency department for further evaluation of right lower quadrant pain. Patient states that she has had pain in this area since April 2015 after having a colonoscopy by Dr. Olevia Perches. Patient states that she had a colon "packed full of polyps". She states she was told that one of the larger polps was in her R lower quadrant. Patient was admitted post colonoscopy for concern for bowel perforation at which time patient had her "colon tattooed" and "clamps put in" by Dr. Olevia Perches. Since this time, patient states that pain is constant and intermittently radiates across her lower abdomen. She also states that sometimes the pain is stabbing in nature. Symptoms associated with nausea. She denies vomiting, but states she is unable to vomit secondary to hiatal hernia repair. Patient had pain further evaluated on 05/19/14 with a CT scan. Per patient, CT suggested possible UTI; therefore, she was put on Bactrim on 05/21/14. Patient states her pain improved with Bactrim, but began to recur 2 days post abx treatment. Her pain has, again, continued to worsen since this time. She has been taking hydrocodone for pain without relief. Symptoms over the last few days have been associated with diffuse myalgias.  Patient denies associated fever, dysuria, diarrhea, melena, hematochezia, CP, SOB. Last BM was yesterday which was normal in color and consistency.  Patient is a 79 y.o. female presenting with abdominal pain. The history is provided by the patient. No language interpreter was used.  Abdominal Pain Associated symptoms: nausea     Past Medical History  Diagnosis Date  .  Hyperlipidemia     a. patient unwilling to use statins.  . Osteoporosis   . DVT (deep venous thrombosis) 12/2007  . PE (pulmonary embolism) 12/2007    a. PE/DVT after neck surgery 2009. b. coumadin d/c 10-2008.  . Pulmonary nodule     incidental per CT:  Pet scan 4-9: likely benign, CT 08-2009 no change, no further CTs (Dr. Gwenette Greet)  . Hemorrhoids   . IBS (irritable bowel syndrome)   . Diverticulosis   . PPD positive   . Macular degeneration 03/2009    Dr. Rosana Hoes  . Allergic rhinitis   . Dyspnea     a. Chronic, extensive w/u see OV note 09-2010. b. [place] 2011: ormal with RA 5 RV 31/2 PA 27/12 (19) PCW 10 CO normal. No evidence of shunting with sitting up in cath lab. c. CPX 2011: see report. d. Prior fluoro of diaphragm -  R diaphragm elevated at rest but both moved with inspiration.   Marland Kitchen Spinal stenosis   . GERD (gastroesophageal reflux disease)     a. Hx GERD/esophageal dysmotility followed by Dr. Olevia Perches.   Marland Kitchen Hypertension   . Neuropathy     a. Hands, feet, legs.  . Orthostatic hypotension   . CAD (coronary artery disease)     a. Nonobst in 2011. b. Abnormal nuc 09/2013 -> s/p cutting balloon to D2; mild LAD disease 10/31/13.  . Osteomyelitis     a. Adm 04/2013: Charcot collapse of the right foot with osteomyelitis and ulceration, s/p excision.  . Venous insufficiency     a. Contributing to LEE.  . Type II diabetes mellitus   .  DJD (degenerative joint disease)   . Arthritis     "back; ankles; hands; knees" (10/31/2013)  . Recurrent UTI   . 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 1994 - malignant polyp removed during colonoscopy.  . Hiatal hernia     a. s/p Nissen fundoplication 2423.  Marland Kitchen RSD (reflex sympathetic dystrophy)     a. Chronic pain.  . Abnormal echocardiogram     09/2013: mild LVH, mild focal basal hypertrophy of septum, EF 60-65%, normal WM, grade 1 diastolic dysfunction, MAC, mild LAE, ASA, PASP 34. Possible oscillating MV density - reviewed by MD and felt there was no  significant abnormality other than MAC noted with mitral valve and did not require TEE.  . Adenomatous colon polyp   . Pneumonia    Past Surgical History  Procedure Laterality Date  . Nissen fundoplication  02/23/6143  . Cholecystectomy  03/2002  . Cataract extraction w/ intraocular lens  implant, bilateral  2004    feb 2004 left, aug 2004 right  . Rotator cuff repair Right 07/2007    Dr. Percell Miller  . Anterior cervical decomp/discectomy fusion  12/18/07    For OA,  Dr. Lorin Mercy:  fu by a PE  . Toe amputation Right 08/2008    3rd toe, Dr. Sharol Given due to osteomyelitis  . Nasal septum surgery  09/1963  . Vein ligation Bilateral 03/1966  . Carpal tunnel release Left 09/2011  . Amputation  03/06/2012    Procedure: AMPUTATION FOOT;  Surgeon: Newt Minion, MD;  Location: [place];  Service: Orthopedics;  Laterality: Left;  FIFTH RAY AMPUTATION   . Vena cava filter placement  01/2010    green filter; "due to blood clots"  . Ankle fusion  09/27/2012    Procedure: ANKLE FUSION;  Surgeon: Newt Minion, MD;  Location: [place];  Service: Orthopedics;  Laterality: Left;  Left Tibiocalcaneal Fusion  . Ankle fusion Right 05/09/2013    Procedure: ANKLE FUSION;  Surgeon: Newt Minion, MD;  Location: [place];  Service: Orthopedics;  Laterality: Right;  Excision Osteomyelitis Base 1st MT Right Foot, Fusion Medial Column  . Cardiac catheterization  05/2010     at [facility]  . Coronary angioplasty  10/31/2013  . Vaginal hysterectomy  03/1975  . Ankle surgery Left apr & june 1991  . Cyst removal hand  06/2003  . Rotator cuff repair Left 11/2004  . Wisdom tooth extraction  06/2007  . Foot bone excision Right 06/2009   Family History  Problem Relation Age of Onset  . Migraines Sister   . Hypertension    . Heart disease Mother     mitral valve replaced  . Rheum arthritis Mother   . Colon cancer Neg Hx   . Esophageal cancer Neg Hx   . Stomach cancer Neg Hx   . Rectal cancer Neg Hx   . Diabetic kidney disease Daughter   .  Breast cancer    . Headache Sister    History  Substance Use Topics  . Smoking status: Never Smoker   . Smokeless tobacco: Never Used  . Alcohol Use: No   OB History   Grav Para Term Preterm Abortions TAB SAB Ect Mult Living                  Review of Systems  Gastrointestinal: Positive for nausea and abdominal pain.  Musculoskeletal: Positive for myalgias.  All other systems reviewed and are negative.    Allergies  Cymbalta; Gabapentin; Adhesive; Cefazolin; Ciprofloxacin; Clorazepate dipotassium; Darifenacin hydrobromide; Dilaudid; Enablex; Levofloxacin; Lyrica; Methadone hcl; Morphine and related; Pentazocine lactate; Talwin; and Doxycycline  Home Medications   Prior to Admission medications   Medication Sig Start Date End Date Taking? Authorizing Provider  acetaminophen (TYLENOL) 500 MG tablet Take 500 mg by mouth daily as needed for mild pain or moderate pain.    Yes Historical Provider, MD  Alpha-Lipoic Acid 600 MG CAPS Take 600 mg by mouth 2 (two) times daily.   Yes Historical Provider, MD  amLODipine-benazepril (LOTREL) 5-10 MG per capsule take 1 capsule by mouth once daily   Yes Colon Branch, MD  aspirin EC 81 MG tablet Take 1 tablet (81 mg total) by mouth daily. 10/29/13  Yes Liliane Shi, PA-C  Boron 3 MG CAPS Take 3 mg by mouth every evening.    Yes Historical Provider, MD  Cholecalciferol (VITAMIN D3) 5000 UNITS CAPS Take 5,000 Units by mouth every evening.   Yes Historical Provider, MD  Cyanocobalamin (VITAMIN B-12) 2500 MCG SUBL Place 2,500 mcg under the tongue daily.   Yes Historical Provider, MD  diclofenac sodium (VOLTAREN) 1 % GEL Apply 1 application topically daily as needed (pain).   Yes Historical Provider, MD  hydrochlorothiazide (MICROZIDE) 12.5 MG capsule Take 12.5 mg by mouth daily.   Yes Historical Provider, MD  HYDROcodone-acetaminophen (NORCO) 10-325 MG per tablet Take 1 tablet by mouth every 6 (six) hours as needed for moderate pain or severe pain.  06/04/14  Yes Colon Branch, MD  hydrocortisone (ANUSOL-HC) 25 MG suppository Place 25 mg rectally 2 (two) times daily as needed for hemorrhoids.  10/14/13  Yes Lafayette Dragon, MD  hyoscyamine (LEVSIN/SL) 0.125 MG SL tablet Place 1 tablet (0.125 mg total) under the tongue every 4 (four) hours as needed. 05/06/14  Yes Lafayette Dragon, MD  Krill Oil 300 MG CAPS Take 300 mg by mouth every evening.    Yes Historical Provider, MD  meclizine (ANTIVERT) 12.5 MG tablet take 1 tablet by mouth three times a day if needed   Yes Colon Branch, MD  metoprolol succinate (TOPROL-XL) 25 MG 24 hr tablet Take 1 tablet (25 mg total) by mouth daily. 03/25/14  Yes Dorothy Spark, MD  Multiple Vitamins-Minerals (PRESERVISION AREDS 2 PO) Take 2 tablets by mouth daily.   Yes Historical Provider, MD  nabumetone (RELAFEN) 750 MG tablet Take 750 mg by mouth 2 (two) times daily.   Yes Historical Provider, MD  nitroGLYCERIN (NITROSTAT) 0.4 MG SL tablet Place 1 tablet (0.4 mg total) under the tongue every 5 (five) minutes as needed for chest pain. 03/05/14  Yes Dorothy Spark, MD  nortriptyline (PAMELOR) 10 MG capsule Take 10 mg by mouth daily.   Yes Historical Provider, MD  omeprazole (PRILOSEC) 20 MG capsule Take 1 capsule (20 mg total) by mouth 2 (two) times daily. 04/16/14  Yes Lafayette Dragon, MD  OVER THE COUNTER MEDICATION Take 400 mg by mouth 2 (two) times daily. tumeric   Yes Historical Provider, MD  OVER THE COUNTER MEDICATION Take 1 capsule by mouth 2 (two) times daily. Integrative Digestive Formula   Yes Historical Provider, MD  Polyvinyl Alcohol-Povidone (REFRESH OP) Place 1 drop into both eyes daily as needed (dry eyes).   Yes Historical Provider, MD  Probiotic Product (PROBIOTIC DAILY PO) Take 1 capsule by mouth daily.   Yes Historical Provider, MD  polyethylene glycol powder (GLYCOLAX/MIRALAX) powder Take 17 g by mouth daily. Until  daily soft stools  OTC 06/09/14   Antonietta Breach, PA-C   BP 132/61  Pulse 73  Temp(Src) 97.9  F (36.6 C)  Resp 16  SpO2 99%  Physical Exam  Nursing note and vitals reviewed. Constitutional: She is oriented to person, place, and time. She appears well-developed and well-nourished. No distress.  Nontoxic/nonseptic appearing  HENT:  Head: Normocephalic and atraumatic.  Eyes: Conjunctivae and EOM are normal. No scleral icterus.  Neck: Normal range of motion.  Cardiovascular: Normal rate, regular rhythm and intact distal pulses.   Pulmonary/Chest: Effort normal and breath sounds normal. No respiratory distress. She has no wheezes. She has no rales.  Chest expansion symmetric. No tachypnea or dyspnea.  Abdominal: Soft. She exhibits no distension. There is tenderness. There is no rebound and no guarding.  Soft abdomen with focal tenderness to palpation in the right lower quadrant, right upper quadrant, and epigastric region. No peritoneal signs or guarding. No rebound tenderness.  Musculoskeletal: Normal range of motion.  Neurological: She is alert and oriented to person, place, and time. She exhibits normal muscle tone. Coordination normal.  GCS 15. Speech is goal oriented. Patient moves extremities without ataxia.  Skin: Skin is warm and dry. No rash noted. She is not diaphoretic. No erythema. No pallor.  Psychiatric: She has a normal mood and affect. Her behavior is normal.    ED Course  Procedures (including critical care time) Labs Review Labs Reviewed  COMPREHENSIVE METABOLIC PANEL - Abnormal; Notable for the following:    Glucose, Bld 122 (*)    BUN 27 (*)    GFR calc non Af Amer 84 (*)    All other components within normal limits  URINALYSIS, ROUTINE W REFLEX MICROSCOPIC - Abnormal; Notable for the following:    Leukocytes, UA SMALL (*)    All other components within normal limits  URINE MICROSCOPIC-ADD ON - Abnormal; Notable for the following:    Casts HYALINE CASTS (*)    All other components within normal limits  URINE CULTURE  CBC WITH DIFFERENTIAL  LIPASE,  BLOOD  I-STAT CG4 LACTIC ACID, ED    Imaging Review Ct Abdomen Pelvis W Contrast  06/09/2014   CLINICAL DATA:  Abdominal pain.  EXAM: CT ABDOMEN AND PELVIS WITH CONTRAST  TECHNIQUE: Multidetector CT imaging of the abdomen and pelvis was performed using the standard protocol following bolus administration of intravenous contrast.  CONTRAST:  163mL OMNIPAQUE IOHEXOL 300 MG/ML  SOLN  COMPARISON:  05/14/2014  FINDINGS: The lung bases are clear.  Diffuse fatty infiltration of the liver but no focal hepatic lesions other than a simple left hepatic lobe cyst. No biliary dilatation. The gallbladder is surgically absent. Stable mild common bile duct dilatation. The pancreas is unremarkable. The spleen is normal in size. No focal lesions. The adrenal glands and kidneys are unremarkable. Small renal cysts and mild scarring changes noted. No hydronephrosis or renal calculi.  There are surgical changes at the GE junction. The stomach is unremarkable. The duodenum, small bowel and colon are unremarkable. No inflammatory changes, mass lesions or obstructive findings. No mesenteric or retroperitoneal mass or adenopathy. Small scattered lymph nodes are noted. The aorta is normal in caliber. Scattered atherosclerotic calcifications. An IVC filter is noted.  There is a small amount of air in the bladder which could be due to instrumentation. The uterus is surgically absent. There is a large amount of stool colon. No pelvic mass, adenopathy or free pelvic fluid collections. No inguinal mass or adenopathy. Small periumbilical abdominal  wall hernia.  Stable L1 compression fracture and advanced degenerative changes in the spine.  IMPRESSION: No acute abdominal/pelvic findings, mass lesions or adenopathy.  Moderate stool throughout the colon suggesting constipation.  Diffuse fatty infiltration of the liver.  Small amount of air in the bladder could be used to instrumentation or prior Foley catheter placement.   Electronically Signed    By: Kalman Jewels M.D.   On: 06/09/2014 01:03     EKG Interpretation None      MDM   Final diagnoses:  Right lower quadrant abdominal pain  Constipation, unspecified constipation type    79 year old female presents to the emergency department for further evaluation of chronic right lower abdominal pain which has been present since April 2015 following a colonoscopy. Patient has been followed by Dr. Olevia Perches of gastroenterology for her abdominal pain. Patient state is well and nontoxic appearing. She is afebrile and hemodynamically stable. Laboratory workup completed which shows no leukocytosis, anemia, or electrolyte imbalance. Liver and kidney function preserved. Urinalysis does not suggest infection. Verbal readback value on lactate is also normal.  CT abdomen and pelvis ordered for further evaluation of abdominal pain. CT shows no acute abdominal pelvic findings, though does note a small amount of air in the bladder as well as constipation. Patient with no history of prior Foley catheter placement. Given her history of pain following colonoscopy, fistula considered. Possibility of fistula discussed with radiologist states there is no evidence of this on CT. He does recommend CT pelvis in the future with rectal contrast to evaluate for this if symptoms persist. If fistula was present, however, I would have expected patient to have a contaminated urine specimen today. Urine sent for culture.  Given reassuring workup, believe patient is stable and appropriate for discharge today with instruction to followup with her gastroenterologist. Patient does have a followup appointment scheduled with her primary care doctor in 2 days. Will prescribe MiraLax for constipation. Patient with hydrocodone for pain control. Return precautions provided and patient agreeable to plan with no unaddressed concerns.   Filed Vitals:   06/09/14 0030 06/09/14 0100 06/09/14 0143 06/09/14 0200  BP: 125/61 123/52 132/61  124/65  Pulse: 65 69 73 71  Temp:      Resp:      SpO2: 100% 100% 99% 99%     Antonietta Breach, PA-C 06/09/14 0250

## 2014-06-08 NOTE — Telephone Encounter (Signed)
Spoke with patient who states that she is going to [facility]

## 2014-06-09 ENCOUNTER — Emergency Department (HOSPITAL_COMMUNITY): Payer: Medicare Other

## 2014-06-09 ENCOUNTER — Telehealth: Payer: Self-pay | Admitting: Internal Medicine

## 2014-06-09 DIAGNOSIS — R1031 Right lower quadrant pain: Secondary | ICD-10-CM | POA: Diagnosis not present

## 2014-06-09 DIAGNOSIS — K7689 Other specified diseases of liver: Secondary | ICD-10-CM | POA: Diagnosis not present

## 2014-06-09 DIAGNOSIS — R109 Unspecified abdominal pain: Secondary | ICD-10-CM

## 2014-06-09 LAB — LIPASE, BLOOD: Lipase: 16 U/L (ref 11–59)

## 2014-06-09 LAB — I-STAT CG4 LACTIC ACID, ED: Lactic Acid, Venous: 1.37 mmol/L (ref 0.5–2.2)

## 2014-06-09 MED ORDER — IOHEXOL 300 MG/ML  SOLN
100.0000 mL | Freq: Once | INTRAMUSCULAR | Status: AC | PRN
  Administered 2014-06-09: 100 mL via INTRAVENOUS

## 2014-06-09 MED ORDER — POLYETHYLENE GLYCOL 3350 17 GM/SCOOP PO POWD: 17.0000 g | Freq: Every day | ORAL | Status: DC

## 2014-06-09 NOTE — Discharge Instructions (Signed)
Abdominal Pain °Many things can cause belly (abdominal) pain. Most times, the belly pain is not dangerous. Many cases of belly pain can be watched and treated at home. °HOME CARE  °· Do not take medicines that help you go poop (laxatives) unless told to by your doctor. °· Only take medicine as told by your doctor. °· Eat or drink as told by your doctor. Your doctor will tell you if you should be on a special diet. °GET HELP IF: °· You do not know what is causing your belly pain. °· You have belly pain while you are sick to your stomach (nauseous) or have runny poop (diarrhea). °· You have pain while you pee or poop. °· Your belly pain wakes you up at night. °· You have belly pain that gets worse or better when you eat. °· You have belly pain that gets worse when you eat fatty foods. °· You have a fever. °GET HELP RIGHT AWAY IF:  °· The pain does not go away within 2 hours. °· You keep throwing up (vomiting). °· The pain changes and is only in the right or left part of the belly. °· You have bloody or tarry looking poop. °MAKE SURE YOU:  °· Understand these instructions. °· Will watch your condition. °· Will get help right away if you are not doing well or get worse. °Document Released: 03/27/2008 Document Revised: 10/14/2013 Document Reviewed: 06/18/2013 °ExitCare® Patient Information ©2015 [employer]. This information is not intended to replace advice given to you by your health care provider. Make sure you discuss any questions you have with your health care provider. °Constipation °Constipation is when a person has fewer than three bowel movements a week, has difficulty having a bowel movement, or has stools that are dry, hard, or larger than normal. As people grow older, constipation is more common. If you try to fix constipation with medicines that make you have a bowel movement (laxatives), the problem may get worse. Long-term laxative use may cause the muscles of the colon to become weak. A low-fiber diet,  not taking in enough fluids, and taking certain medicines may make constipation worse.  °CAUSES  °· Certain medicines, such as antidepressants, pain medicine, iron supplements, antacids, and water pills.   °· Certain diseases, such as diabetes, irritable bowel syndrome (IBS), thyroid disease, or depression.   °· Not drinking enough water.   °· Not eating enough fiber-rich foods.   °· Stress or travel.   °· Lack of physical activity or exercise.   °· Ignoring the urge to have a bowel movement.   °· Using laxatives too much.   °SIGNS AND SYMPTOMS  °· Having fewer than three bowel movements a week.   °· Straining to have a bowel movement.   °· Having stools that are hard, dry, or larger than normal.   °· Feeling full or bloated.   °· Pain in the lower abdomen.   °· Not feeling relief after having a bowel movement.   °DIAGNOSIS  °Your health care provider will take a medical history and perform a physical exam. Further testing may be done for severe constipation. Some tests may include: °· A barium enema X-ray to examine your rectum, colon, and, sometimes, your small intestine.   °· A sigmoidoscopy to examine your lower colon.   °· A colonoscopy to examine your entire colon. °TREATMENT  °Treatment will depend on the severity of your constipation and what is causing it. Some dietary treatments include drinking more fluids and eating more fiber-rich foods. Lifestyle treatments may include regular exercise. If these diet and   lifestyle recommendations do not help, your health care provider may recommend taking over-the-counter laxative medicines to help you have bowel movements. Prescription medicines may be prescribed if over-the-counter medicines do not work.  °HOME CARE INSTRUCTIONS  °· Eat foods that have a lot of fiber, such as fruits, vegetables, whole grains, and beans. °· Limit foods high in fat and processed sugars, such as french fries, hamburgers, cookies, candies, and soda.   °· A fiber supplement may be added  to your diet if you cannot get enough fiber from foods.   °· Drink enough fluids to keep your urine clear or pale yellow.   °· Exercise regularly or as directed by your health care provider.   °· Go to the restroom when you have the urge to go. Do not hold it.   °· Only take over-the-counter or prescription medicines as directed by your health care provider. Do not take other medicines for constipation without talking to your health care provider first.   °SEEK IMMEDIATE MEDICAL CARE IF:  °· You have bright red blood in your stool.   °· Your constipation lasts for more than 4 days or gets worse.   °· You have abdominal or rectal pain.   °· You have thin, pencil-like stools.   °· You have unexplained weight loss. °MAKE SURE YOU:  °· Understand these instructions. °· Will watch your condition. °· Will get help right away if you are not doing well or get worse. °Document Released: 07/07/2004 Document Revised: 10/14/2013 Document Reviewed: 07/21/2013 °ExitCare® Patient Information ©2015 [employer]. This information is not intended to replace advice given to you by your health care provider. Make sure you discuss any questions you have with your health care provider. ° °

## 2014-06-09 NOTE — Telephone Encounter (Signed)
Spoke with patient and she called Dr. Larose Kells office and they could not see her until Wednesday. She went to ED for evaluation and was told she was constipated. She is not sure that all her problems are due to constipation. She asked about getting blood cultures because her daughter suggested this.  Scheduled OV with Dr. Olevia Perches on 06/12/14 at 3:15 PM for f/u after ED visit.

## 2014-06-09 NOTE — ED Notes (Signed)
Patient transported to CT 

## 2014-06-09 NOTE — Telephone Encounter (Signed)
I will see her on Friday 06/12/2014

## 2014-06-10 DIAGNOSIS — M1710 Unilateral primary osteoarthritis, unspecified knee: Secondary | ICD-10-CM | POA: Diagnosis not present

## 2014-06-10 LAB — URINE CULTURE
Colony Count: NO GROWTH
Culture: NO GROWTH

## 2014-06-10 NOTE — Telephone Encounter (Signed)
Please obtain CBC, sed rate before I see her on Friday 06/12/2014

## 2014-06-10 NOTE — Telephone Encounter (Signed)
Labs in EPIC. Patient notified. 

## 2014-06-11 ENCOUNTER — Encounter: Payer: Self-pay | Admitting: *Deleted

## 2014-06-11 ENCOUNTER — Other Ambulatory Visit: Payer: Self-pay | Admitting: Internal Medicine

## 2014-06-11 NOTE — ED Provider Notes (Signed)
Medical screening examination/treatment/procedure(s) were conducted as a shared visit with non-physician practitioner(s) and myself.  I personally evaluated the patient during the encounter.  Acute on chronic RLQ pain with nausea since colonoscopy in April. No vomiting. CT on 7/28 reassuring, pain improved with treatment for UTI. Appears well and nontoxic. TTP R lower and mid abdomen without peritoneal signs. Labs reassuring, lactate normal. CT without acute pathology.  Persistent air in bladder.  UA negative.  PAC Humes d/w radiology who does not visualize fistula.   EKG Interpretation None       Ezequiel Essex, MD 06/11/14 1505

## 2014-06-12 ENCOUNTER — Encounter: Payer: Self-pay | Admitting: Internal Medicine

## 2014-06-12 ENCOUNTER — Telehealth: Payer: Self-pay

## 2014-06-12 ENCOUNTER — Other Ambulatory Visit (INDEPENDENT_AMBULATORY_CARE_PROVIDER_SITE_OTHER): Payer: Medicare Other

## 2014-06-12 ENCOUNTER — Ambulatory Visit (INDEPENDENT_AMBULATORY_CARE_PROVIDER_SITE_OTHER): Payer: Medicare Other | Admitting: Internal Medicine

## 2014-06-12 VITALS — BP 120/80 | HR 84 | Ht 63.0 in | Wt 198.4 lb

## 2014-06-12 DIAGNOSIS — R1031 Right lower quadrant pain: Secondary | ICD-10-CM

## 2014-06-12 DIAGNOSIS — R109 Unspecified abdominal pain: Secondary | ICD-10-CM

## 2014-06-12 DIAGNOSIS — I2510 Atherosclerotic heart disease of native coronary artery without angina pectoris: Secondary | ICD-10-CM

## 2014-06-12 DIAGNOSIS — Z9889 Other specified postprocedural states: Secondary | ICD-10-CM | POA: Diagnosis not present

## 2014-06-12 MED ORDER — SULFAMETHOXAZOLE-TRIMETHOPRIM 800-160 MG PO TABS: 1.0000 | ORAL_TABLET | Freq: Two times a day (BID) | ORAL | Status: DC

## 2014-06-12 NOTE — Progress Notes (Signed)
[name] [birth year] [id number]  Note: This dictation was prepared with Dragon digital system. Any transcriptional errors that result from this procedure are unintentional.   History of Present Illness:  This is a 79 year old white female with right lower quadrant abdominal pain which started after colonoscopy and polypectomy on 01/28/2014.She has been having chronic abdominal pain for years, c/w IBS. She had at least 2 descending colon polyps including one 2 cm carpeted polyp in the cecum ( which was removed piecemeal)  and all polyps were adenomatous. There was no dysplasia or cancer. She was hospitalized for post polypectomy syndrome after CT scan showed inflammatory changes around the cecum. She was treated with intravenous antibiotics . She was discharged on oral antibiotics but the pain has continued off and on now for 4 months and she since then had several CT scans of the abdomen which are now showing  Complete  resolution of any inflammation in the right lower quadrant. 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 She also has a history of gastroesophageal reflux and underwent a Nissen fundoplication several years ago. She has reflux, sympathetic dystrophy and chronic leg pain for which she takes narcotics. She reports a low-grade temperature of about 98.6, normal for her being 97. We have put her empirically on Septra DS one by mouth twice a day with complete resolution of her pain for 10 days. She is requesting another course of Septra. A CBC today is not available because lab computers have been down.    Past Medical History  Diagnosis Date  . Hyperlipidemia     a. patient unwilling to use statins.  . Osteoporosis   . DVT (deep venous thrombosis) 12/2007  . PE (pulmonary embolism) 12/2007    a. PE/DVT after neck surgery 2009. b. coumadin d/c 10-2008.  . Pulmonary nodule    incidental per CT:  Pet scan 4-9: likely benign, CT 08-2009 no change, no further CTs (Dr. Gwenette Greet)  . Hemorrhoids   . IBS (irritable bowel syndrome)   . Diverticulosis   . PPD positive   . Macular degeneration 03/2009    Dr. Rosana Hoes  . Allergic rhinitis   . Dyspnea     a. Chronic, extensive w/u see OV note 09-2010. b. [place] 2011: ormal with RA 5 RV 31/2 PA 27/12 (19) PCW 10 CO normal. No evidence of shunting with sitting up in cath lab. c. CPX 2011: see report. d. Prior fluoro of diaphragm -  R diaphragm elevated at rest but both moved with inspiration.   Marland Kitchen Spinal stenosis   . GERD (gastroesophageal reflux disease)     a. Hx GERD/esophageal dysmotility followed by Dr. Olevia Perches.   Marland Kitchen Hypertension   . Neuropathy     a. Hands, feet, legs.  . Orthostatic hypotension   . CAD (coronary artery disease)     a. Nonobst in 2011. b. Abnormal nuc 09/2013 -> s/p cutting balloon to D2; mild LAD disease 10/31/13.  . Osteomyelitis     a. Adm 04/2013: Charcot collapse of the right foot with osteomyelitis and ulceration, s/p excision.  . Venous insufficiency     a. Contributing to LEE.  . Type II diabetes mellitus   . DJD (degenerative joint disease)   . Arthritis     "back; ankles; hands; knees" (10/31/2013)  . Recurrent UTI   . Carcinoma in situ in a polyp 1994  a. 1994 - malignant polyp removed during colonoscopy.  . Hiatal hernia     a. s/p Nissen fundoplication 4854.  Marland Kitchen RSD (reflex sympathetic dystrophy)     a. Chronic pain.  . Abnormal echocardiogram     09/2013: mild LVH, mild focal basal hypertrophy of septum, EF 60-65%, normal WM, grade 1 diastolic dysfunction, MAC, mild LAE, ASA, PASP 34. Possible oscillating MV density - reviewed by MD and felt there was no significant abnormality other than MAC noted with mitral valve and did not require TEE.  . Adenomatous colon polyp   . Pneumonia   . Fatty liver     Past Surgical History  Procedure Laterality Date  . Nissen fundoplication  03/24/7034  .  Cholecystectomy  03/2002  . Cataract extraction w/ intraocular lens  implant, bilateral  2004    feb 2004 left, aug 2004 right  . Rotator cuff repair Right 07/2007    Dr. Percell Miller  . Anterior cervical decomp/discectomy fusion  12/18/07    For OA,  Dr. Lorin Mercy:  fu by a PE  . Toe amputation Right 08/2008    3rd toe, Dr. Sharol Given due to osteomyelitis  . Nasal septum surgery  09/1963  . Vein ligation Bilateral 03/1966  . Carpal tunnel release Left 09/2011  . Amputation  03/06/2012    Procedure: AMPUTATION FOOT;  Surgeon: Newt Minion, MD;  Location: [facility];  Service: Orthopedics;  Laterality: Left;  FIFTH RAY AMPUTATION   . Vena cava filter placement  01/2010    green filter; "due to blood clots"  . Ankle fusion  09/27/2012    Procedure: ANKLE FUSION;  Surgeon: Newt Minion, MD;  Location: [facility];  Service: Orthopedics;  Laterality: Left;  Left Tibiocalcaneal Fusion  . Ankle fusion Right 05/09/2013    Procedure: ANKLE FUSION;  Surgeon: Newt Minion, MD;  Location: [facility];  Service: Orthopedics;  Laterality: Right;  Excision Osteomyelitis Base 1st MT Right Foot, Fusion Medial Column  . Cardiac catheterization  05/2010     at [facility]  . Coronary angioplasty  10/31/2013  . Vaginal hysterectomy  03/1975  . Ankle surgery Left apr & june 1991  . Cyst removal hand  06/2003  . Rotator cuff repair Left 11/2004  . Wisdom tooth extraction  06/2007  . Foot bone excision Right 06/2009    Allergies  Allergen Reactions  . Cymbalta [Duloxetine Hcl] Swelling    Swelling in legs  . Gabapentin Swelling    Swelling in legs  . Adhesive [Tape]     rash  . Cefazolin Hives  . Ciprofloxacin Other (See Comments)    Per pt, caused body aches   . Clorazepate Dipotassium Other (See Comments)    Unknown reaction  . Darifenacin Hydrobromide Other (See Comments)    hypotension, near syncope  . Dilaudid [Hydromorphone Hcl] Other (See Comments)     confused, intense itching  . Enablex [Darifenacin Hydrobromide Er] Other  (See Comments)    Hypotension, near syncope  . Levofloxacin     Causes wrist pain  . Lyrica [Pregabalin] Swelling    Swelling in legs  . Methadone Hcl Other (See Comments)    Reaction unknown  . Morphine And Related Other (See Comments)    Confusion   . Pentazocine Lactate Other (See Comments)    Altered mental state, "climbing walls", anxiety  . Talwin [Pentazocine] Other (See Comments)    "climbing walls" anxiety  . Doxycycline Rash    Family history and social  history have been reviewed.  Review of Systems: Right lower quadrant abdominal pain. Low-grade temperature  The remainder of the 10 point ROS is negative except as outlined in the H&P  Physical Exam: General Appearance Well developed, in no distress Eyes  Non icteric  HEENT  Non traumatic, normocephalic  Mouth No lesion, tongue papillated, no cheilosis Neck Supple without adenopathy, thyroid not enlarged, no carotid bruits, no JVD Lungs Clear to auscultation bilaterally COR Normal S1, normal S2, regular rhythm, no murmur, quiet precordium Abdomen soft mildly tender diffusely but more so in the right lower quadrant. No rebound. Normal bowel sounds Rectal not done Extremities  No pedal edema, right foot brace Skin No lesions Neurological Alert and oriented x 3 Psychological Normal mood and affect  Assessment and Plan:   Problem #18 79 year old white female with persistent right lower quadrant pain which was initially diagnosed as post polypectomy syndrome in April 2015. She has since then had normal imaging studies of the right lower quadrant. She had a normal white blood cell count but reports improvement of the pain with antibiotics. I will give her another course of Septra one by mouth twice a day for 21 days.  Problem #2 Multiple adenomatous polyps including carcinoma in situ in 1994. Recent polyps removed from the cecum and right colon raised the concern about the possibility of dysplasia and possibility of  potential for malignant transformation. We have discussed possible right hemicolectomy but she would prefer to have another colonoscopy in a year to remove all of the rest of the polyps.    Delfin Edis 06/12/2014

## 2014-06-12 NOTE — Patient Instructions (Addendum)
We have sent the following medications to your pharmacy for you to pick up at your convenience: Septra DS Dr Larose Kells

## 2014-06-12 NOTE — Telephone Encounter (Signed)
UDS: 06/04/2014  Positive for Hydrocodone and hydromorphone.  Low risk per Dr. Larose Kells (06/12/2014).

## 2014-06-13 LAB — CBC WITH DIFFERENTIAL/PLATELET
Basophils Absolute: 0.1 10*3/uL (ref 0.0–0.1)
Basophils Relative: 1.4 % (ref 0.0–3.0)
Eosinophils Absolute: 0.2 10*3/uL (ref 0.0–0.7)
Eosinophils Relative: 2.7 % (ref 0.0–5.0)
HCT: 41.2 % (ref 36.0–46.0)
Hemoglobin: 13.6 g/dL (ref 12.0–15.0)
Lymphocytes Relative: 34.3 % (ref 12.0–46.0)
Lymphs Abs: 2.3 10*3/uL (ref 0.7–4.0)
MCHC: 32.9 g/dL (ref 30.0–36.0)
MCV: 88.8 fl (ref 78.0–100.0)
Monocytes Absolute: 0.4 10*3/uL (ref 0.1–1.0)
Monocytes Relative: 5.9 % (ref 3.0–12.0)
Neutro Abs: 3.7 10*3/uL (ref 1.4–7.7)
Neutrophils Relative %: 55.7 % (ref 43.0–77.0)
Platelets: 211 10*3/uL (ref 150.0–400.0)
RBC: 4.65 Mil/uL (ref 3.87–5.11)
RDW: 14.9 % (ref 11.5–15.5)
WBC: 6.7 10*3/uL (ref 4.0–10.5)

## 2014-06-13 LAB — SEDIMENTATION RATE: Sed Rate: 9 mm/hr (ref 0–22)

## 2014-06-16 ENCOUNTER — Ambulatory Visit (INDEPENDENT_AMBULATORY_CARE_PROVIDER_SITE_OTHER): Payer: Medicare Other | Admitting: Cardiology

## 2014-06-16 ENCOUNTER — Encounter: Payer: Self-pay | Admitting: Cardiology

## 2014-06-16 VITALS — BP 122/70 | HR 72 | Ht 63.0 in | Wt 199.0 lb

## 2014-06-16 DIAGNOSIS — R079 Chest pain, unspecified: Secondary | ICD-10-CM

## 2014-06-16 DIAGNOSIS — I2510 Atherosclerotic heart disease of native coronary artery without angina pectoris: Secondary | ICD-10-CM

## 2014-06-16 MED ORDER — CLOPIDOGREL BISULFATE 75 MG PO TABS: 75.0000 mg | ORAL_TABLET | Freq: Every day | ORAL | Status: DC

## 2014-06-16 NOTE — Progress Notes (Signed)
Patient ID: NAOMA BOXELL, female   DOB: [birth year], 79 y.o.   MRN: [id number]     Date:  06/16/2014   ID:  LOGHAN SUBIA, DOB 1934/10/31, MRN [id number]  PCP:  [name], MD  Cardiologist:  Dr. Thompson Grayer => Dr. Ena Dawley     History of Present Illness: [name] is a 79 y.o. female with a hx of HTN, HL, T2DM, orthostatic hypotension, prior DVT after neck surgery in 2010, s/p IVC filter, GERD and reflex sympathetic dystrophy.    She had an extensive workup for dyspnea in the past.  Please see my last office visit note from 10/29/2013 for complete details.  Patient was evaluated by Dr. Rayann Heman in January for chest pain and shortness of breath.  Myoview was abnormal and cardiac catheterization was recommended. Patient was admitted 1/9-1/10. Cardiac catheterization demonstrated high-grade disease in the second diagonal which was treated with cutting balloon angioplasty.  Patient was to have GI evaluation in the near future (EGD and colonoscopy).  She had a recent history of dysphagia as well as rectal bleeding with prior history of adenomatous polyps and in situ carcinoma.  Therefore, long term commitment to dual antiplatelet Rx was to be avoided.    She has noted recurrent left-sided chest discomfort. She feels as though that this is indigestion. She has had this for years without significant change. She is fairly sedentary. She cannot tell me if she has had significant improvement with any symptoms since her angioplasty. She denies associated dyspnea. She denies exertional chest discomfort. She does take Gas-X for her chest discomfort with relief.  Overall, she describes NYHA class 2-2b symptoms.  She denies syncope. She denies significant pedal edema.  She is requesting clearance to undergo epidural steroid injections as well as EGD/colonoscopy.  Echocardiogram (10/14/13): Mild LVH, mild focal basal hypertrophy of the septum, EF 60-65%, normal wall motion, grade 1 diastolic dysfunction, MAC,  mild LAE, atrial septal aneurysm, PASP 34. There was a possible oscillating density on the mitral valve on parasternal long axis views (related to MAC).   LexiScan Myoview (10/20/13): Intermediate risk; medium-size reversible defect in the mid LAD territory, EF 68%. LHC (10/31/13): Proximal D2 95, mid circumflex 40, LVEDP 10.  PCI:  Cutting Balloon angioplasty to D2.  The patient has a long history of multiple polyps in her cecum and colon, with history of a malignant polyp many years ago. She just underwent a colonoscopy and EGD with finding of 3 polyps with removal of 2 and partial removal of a large 1. Biopsy showed tubular adenoma and she has a scheduled followup appointment with Dr. Maurene Capes to discuss further management.  She otherwise reports stable chest pain and back pain that's not related to exertion and is relieved by antacids. She denies any shortness of breath, palpitation or syncope.  06/16/2014 - this is a 3 months followup, in the interim the patient underwent EGD and colonoscopy with removal of multiple small and 1 large polyp that was associated with prolonged abdominal pain. The patient states that she didn't have bleeding but is persuaded that the pain resolved with Bactrim. She has been seeing Dr. Maurene Capes for this. She has noticed also on and off chest pain and back pain pretty much similar to the symptoms she had prior she received her stent to diagonal artery a year ago. She has stopped taking Plavix prior to her colonoscopy and hasn't restarted yet. She has stable exertional dyspnea no palpitations or syncope.   Recent  Labs: 09/17/2013: Direct LDL 117.9; HDL Cholesterol by NMR 39.30; TSH 1.91  06/08/2014: ALT 12; Creatinine 0.66; Potassium 4.5  06/12/2014: Hemoglobin 13.6   Wt Readings from Last 3 Encounters:  06/16/14 199 lb (90.266 kg)  06/12/14 198 lb 6.4 oz (89.994 kg)  06/04/14 199 lb 4 oz (90.379 kg)     Past Medical History  Diagnosis Date  . Hyperlipidemia     a.  patient unwilling to use statins.  . Osteoporosis   . DVT (deep venous thrombosis) 12/2007  . PE (pulmonary embolism) 12/2007    a. PE/DVT after neck surgery 2009. b. coumadin d/c 10-2008.  . Pulmonary nodule     incidental per CT:  Pet scan 4-9: likely benign, CT 08-2009 no change, no further CTs (Dr. Gwenette Greet)  . Hemorrhoids   . IBS (irritable bowel syndrome)   . Diverticulosis   . PPD positive   . Macular degeneration 03/2009    Dr. Rosana Hoes  . Allergic rhinitis   . Dyspnea     a. Chronic, extensive w/u see OV note 09-2010. b. [place] 2011: ormal with RA 5 RV 31/2 PA 27/12 (19) PCW 10 CO normal. No evidence of shunting with sitting up in cath lab. c. CPX 2011: see report. d. Prior fluoro of diaphragm -  R diaphragm elevated at rest but both moved with inspiration.   Marland Kitchen Spinal stenosis   . GERD (gastroesophageal reflux disease)     a. Hx GERD/esophageal dysmotility followed by Dr. Olevia Perches.   Marland Kitchen Hypertension   . Neuropathy     a. Hands, feet, legs.  . Orthostatic hypotension   . CAD (coronary artery disease)     a. Nonobst in 2011. b. Abnormal nuc 09/2013 -> s/p cutting balloon to D2; mild LAD disease 10/31/13.  . Osteomyelitis     a. Adm 04/2013: Charcot collapse of the right foot with osteomyelitis and ulceration, s/p excision.  . Venous insufficiency     a. Contributing to LEE.  . Type II diabetes mellitus   . DJD (degenerative joint disease)   . Arthritis     "back; ankles; hands; knees" (10/31/2013)  . Recurrent UTI   . 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 1994 - malignant polyp removed during colonoscopy.  . Hiatal hernia     a. s/p Nissen fundoplication 9357.  Marland Kitchen RSD (reflex sympathetic dystrophy)     a. Chronic pain.  . Abnormal echocardiogram     09/2013: mild LVH, mild focal basal hypertrophy of septum, EF 60-65%, normal WM, grade 1 diastolic dysfunction, MAC, mild LAE, ASA, PASP 34. Possible oscillating MV density - reviewed by MD and felt there was no significant abnormality other  than MAC noted with mitral valve and did not require TEE.  . Adenomatous colon polyp   . Pneumonia   . Fatty liver     Current Outpatient Prescriptions  Medication Sig Dispense Refill  . acetaminophen (TYLENOL) 500 MG tablet Take 500 mg by mouth daily as needed for mild pain or moderate pain.       . Alpha-Lipoic Acid 600 MG CAPS Take 600 mg by mouth 2 (two) times daily.      Marland Kitchen amLODipine-benazepril (LOTREL) 5-10 MG per capsule take 1 capsule by mouth once daily  30 capsule  6  . aspirin EC 81 MG tablet Take 1 tablet (81 mg total) by mouth daily.      . Boron 3 MG CAPS Take 3 mg by mouth every evening.       Marland Kitchen  Cholecalciferol (VITAMIN D3) 5000 UNITS CAPS Take 5,000 Units by mouth every evening.      . Cyanocobalamin (VITAMIN B-12) 2500 MCG SUBL Place 2,500 mcg under the tongue daily.      . diclofenac sodium (VOLTAREN) 1 % GEL Apply 1 application topically daily as needed (pain).      Marland Kitchen FREESTYLE LITE test strip CHECK BLOOD SUGAR TWICE A DAY  100 each  10  . hydrochlorothiazide (MICROZIDE) 12.5 MG capsule Take 12.5 mg by mouth daily.      Marland Kitchen HYDROcodone-acetaminophen (NORCO) 10-325 MG per tablet Take 1 tablet by mouth every 6 (six) hours as needed for moderate pain or severe pain.  120 tablet  0  . hydrocortisone (ANUSOL-HC) 25 MG suppository Place 25 mg rectally 2 (two) times daily as needed for hemorrhoids.       . hyoscyamine (LEVSIN/SL) 0.125 MG SL tablet Place 1 tablet (0.125 mg total) under the tongue every 4 (four) hours as needed.  30 tablet  1  . Krill Oil 300 MG CAPS Take 300 mg by mouth every evening.       . Lancets (FREESTYLE) lancets USE TWICE A DAY  100 each  10  . meclizine (ANTIVERT) 12.5 MG tablet take 1 tablet by mouth three times a day if needed  90 tablet  1  . metoprolol succinate (TOPROL-XL) 25 MG 24 hr tablet Take 1 tablet (25 mg total) by mouth daily.  30 tablet  6  . Multiple Vitamins-Minerals (PRESERVISION AREDS 2 PO) Take 2 tablets by mouth daily.      .  nabumetone (RELAFEN) 750 MG tablet Take 750 mg by mouth 2 (two) times daily.      . nitroGLYCERIN (NITROSTAT) 0.4 MG SL tablet Place 1 tablet (0.4 mg total) under the tongue every 5 (five) minutes as needed for chest pain.  90 tablet  3  . nortriptyline (PAMELOR) 10 MG capsule Take 10 mg by mouth daily.      Marland Kitchen omeprazole (PRILOSEC) 20 MG capsule Take 1 capsule (20 mg total) by mouth 2 (two) times daily.  180 capsule  1  . OVER THE COUNTER MEDICATION Take 1 capsule by mouth 2 (two) times daily. Integrative Digestive Formula      . polyethylene glycol powder (GLYCOLAX/MIRALAX) powder Take 17 g by mouth daily. Until daily soft stools  OTC  255 g  0  . Polyvinyl Alcohol-Povidone (REFRESH OP) Place 1 drop into both eyes daily as needed (dry eyes).      . Probiotic Product (PROBIOTIC DAILY PO) Take 1 capsule by mouth daily.      Marland Kitchen sulfamethoxazole-trimethoprim (SEPTRA DS) 800-160 MG per tablet Take 1 tablet by mouth 2 (two) times daily.  42 tablet  0  . TURMERIC PO Take 400 mg by mouth 2 (two) times daily.       No current facility-administered medications for this visit.    Allergies:   Cymbalta; Gabapentin; Adhesive; Cefazolin; Ciprofloxacin; Clorazepate dipotassium; Darifenacin hydrobromide; Dilaudid; Enablex; Levofloxacin; Lyrica; Methadone hcl; Morphine and related; Pentazocine lactate; Talwin; and Doxycycline   Social History:  The patient  reports that she has never smoked. She has never used smokeless tobacco. She reports that she does not drink alcohol or use illicit drugs.   Family History:  The patient's family history includes Breast cancer in an other family member; Diabetic kidney disease in her daughter; Headache in her sister; Heart disease in her mother; Hypertension in an other family member; Migraines in her sister; Rheum  arthritis in her mother. There is no history of Colon cancer, Esophageal cancer, Stomach cancer, or Rectal cancer.   ROS:  Please see the history of present  illness.   All other systems reviewed and negative.   PHYSICAL EXAM: VS:  BP 122/70  Pulse 72  Ht 5\' 3"  (1.6 m)  Wt 199 lb (90.266 kg)  BMI 35.26 kg/m2  SpO2 99% Well nourished, well developed, in no acute distress HEENT: normal Neck: no JVD at 90 Cardiac:  normal S1, S2; RRR; no murmur Lungs:  clear to auscultation bilaterally, no wheezing, rhonchi or rales Abd: soft, nontender, no hepatomegaly Ext: trace bilateral LE edema right wrist without hematoma or mass  Skin: warm and dry Neuro:  CNs 2-12 intact, no focal abnormalities noted  EKG:  NSR, HR 74, normal axis, nonspecific ST-T wave changes   ASSESSMENT AND PLAN:  1. Chest Pain:  Similar to the pain she had prior to her stenting. She has been off Plavix for her colonoscopy and hold it for removal. We'll restart Plavix and schedule a Lexiscan nuclear stress test. Patient is not able to exercise as she had multiple left like surgeries. She will continue aspirin and beta blocker. She is intolerant to statins. 2. Hypertension:  Controlled. 3. Disposition:  F/u with Dr. Ena Dawley in 2 months.    Signed, Dorothy Spark, MD  06/16/2014 11:46 AM

## 2014-06-16 NOTE — Patient Instructions (Signed)
Your physician has recommended you make the following change in your medication:   START TAKING PLAVIX 75 MG DAILY  Your physician has requested that you have a lexiscan myoview. For further information please visit [email or web address]. Please follow instruction sheet, as given.  Your physician recommends that you schedule a follow-up appointment in: [place]

## 2014-06-17 ENCOUNTER — Telehealth: Payer: Self-pay | Admitting: *Deleted

## 2014-06-17 NOTE — Telephone Encounter (Signed)
Per Dr. Olevia Perches, patient does not need to come to 06/26/14 OV. Left a message for patient to call me.

## 2014-06-17 NOTE — Telephone Encounter (Signed)
Spoke with patient and cancelled appointment 

## 2014-06-24 ENCOUNTER — Encounter (HOSPITAL_COMMUNITY): Payer: Medicare Other | Attending: Cardiology | Admitting: Radiology

## 2014-06-24 VITALS — BP 126/71 | Ht 63.0 in | Wt 198.0 lb

## 2014-06-24 DIAGNOSIS — I2510 Atherosclerotic heart disease of native coronary artery without angina pectoris: Secondary | ICD-10-CM

## 2014-06-24 DIAGNOSIS — R079 Chest pain, unspecified: Secondary | ICD-10-CM

## 2014-06-24 MED ORDER — TECHNETIUM TC 99M SESTAMIBI GENERIC - CARDIOLITE
11.0000 | Freq: Once | INTRAVENOUS | Status: AC | PRN
Start: 2014-06-24 — End: 2014-06-24
  Administered 2014-06-24: 11 via INTRAVENOUS

## 2014-06-24 MED ORDER — TECHNETIUM TC 99M SESTAMIBI GENERIC - CARDIOLITE
33.0000 | Freq: Once | INTRAVENOUS | Status: AC | PRN
  Administered 2014-06-24: 33 via INTRAVENOUS

## 2014-06-24 MED ORDER — REGADENOSON 0.4 MG/5ML IV SOLN
0.4000 mg | Freq: Once | INTRAVENOUS | Status: AC
  Administered 2014-06-24: 0.4 mg via INTRAVENOUS

## 2014-06-24 NOTE — Progress Notes (Signed)
[facility] [place], [place] [address] [phone]    Cardiology Nuclear Med Study  [name] is a 79 y.o. female     MRN : [id number]     DOB: 06-19-1935  Procedure Date: 06/24/2014  Nuclear Med Background Indication for Stress Test:  Evaluation for Ischemia and PTCA Patency History:CAD;PTCA;Previous Nuclear Study 14' ischemia EF:68% LAD; Cardiac Risk Factors: Hypertension and Lipids  Symptoms:  Chest Pain   Nuclear Pre-Procedure Caffeine/Decaff Intake:  None NPO After: 9:00pm   Lungs:  clear O2 Sat: 95% on room air. IV 0.9% NS with Angio Cath:  22g  IV Site: R Antecubital  IV Started by:  Crissie Figures, RN  Chest Size (in):  44 Cup Size: B  Height: 5\' 3"  (1.6 m)  Weight:  198 lb (89.812 kg)  BMI:  Body mass index is 35.08 kg/(m^2). Tech Comments:  N/A    Nuclear Med Study 1 or 2 day study: 1 day  Stress Test Type:  Lexiscan  Reading MD: N/A  Order Authorizing Provider:  Ena Dawley, MD  Resting Radionuclide: Technetium 31m Sestamibi  Resting Radionuclide Dose: 10.8 mCi   Stress Radionuclide:  Technetium 57m Sestamibi  Stress Radionuclide Dose: 33.0 mCi           Stress Protocol Rest HR: 66 Stress HR: 88  Rest BP: 126/71 Stress BP: 135/69  Exercise Time (min): n/a METS: n/a   Predicted Max HR: 143 bpm % Max HR: 61.54 bpm Rate Pressure Product: 11880   Dose of Adenosine (mg):  n/a Dose of Lexiscan: 0.4 mg  Dose of Atropine (mg): n/a Dose of Dobutamine: n/a mcg/kg/min (at max HR)  Stress Test Technologist: Ileene Hutchinson, EMT-P  Nuclear Technologist:  Vedia Pereyra, CNMT     Rest Procedure:  Myocardial perfusion imaging was performed at rest 45 minutes following the intravenous administration of Technetium 63m Sestamibi. Rest ECG: NSR with non-specific ST-T wave changes  Stress Procedure:  The patient received IV Lexiscan 0.4 mg over 15-seconds.  Technetium 65m Sestamibi injected at 30-seconds.  Quantitative  spect images were obtained after a 45 minute delay. Stress ECG: No significant change from baseline ECG  QPS Raw Data Images:  Normal; no motion artifact; normal heart/lung ratio. Stress Images:  Normal homogeneous uptake in all areas of the myocardium. Rest Images:  Normal homogeneous uptake in all areas of the myocardium. Subtraction (SDS):  No evidence of ischemia. Transient Ischemic Dilatation (Normal <1.22):  1.15 Lung/Heart Ratio (Normal <0.45):  0.29  Quantitative Gated Spect Images QGS EDV:  63 ml QGS ESV:  17 ml  Impression Exercise Capacity:  Lexiscan with no exercise. BP Response:  Normal blood pressure response. Clinical Symptoms:  No significant symptoms noted. ECG Impression:  No significant ST segment change suggestive of ischemia. Comparison with Prior Nuclear Study: No images to compare  Overall Impression:  Normal stress nuclear study.  No evidence of ischemia.   LV Ejection Fraction: 73%.  LV Wall Motion:  NL LV Function; NL Wall Motion.  [name], [name]., MD, [facility] 06/24/2014, 5:08 PM [address]  [place] Pager [phone]

## 2014-06-26 ENCOUNTER — Ambulatory Visit: Payer: Medicare Other | Admitting: Internal Medicine

## 2014-07-02 ENCOUNTER — Encounter: Payer: Self-pay | Admitting: Internal Medicine

## 2014-07-06 ENCOUNTER — Ambulatory Visit: Payer: Medicare Other | Admitting: Cardiology

## 2014-07-09 DIAGNOSIS — N3020 Other chronic cystitis without hematuria: Secondary | ICD-10-CM | POA: Diagnosis not present

## 2014-07-15 DIAGNOSIS — G894 Chronic pain syndrome: Secondary | ICD-10-CM | POA: Diagnosis not present

## 2014-07-15 DIAGNOSIS — M5137 Other intervertebral disc degeneration, lumbosacral region: Secondary | ICD-10-CM | POA: Diagnosis not present

## 2014-07-15 DIAGNOSIS — M545 Low back pain, unspecified: Secondary | ICD-10-CM | POA: Diagnosis not present

## 2014-07-15 DIAGNOSIS — E663 Overweight: Secondary | ICD-10-CM | POA: Diagnosis not present

## 2014-08-13 ENCOUNTER — Telehealth: Payer: Self-pay | Admitting: Internal Medicine

## 2014-08-13 NOTE — Telephone Encounter (Signed)
Pt is requesting 2 month supply if possible of Hydrocodone.  Last OV: 06/04/2014 Last Fill: 06/04/2014 # 120 0RF UDS: 06/04/2014 Low risk   Please advise.

## 2014-08-13 NOTE — Telephone Encounter (Signed)
Caller name: Azalyn  Relation to pt: Call back number: [phone] Pharmacy:  Reason for call: Pt is requesting rx HYDROcodone-acetaminophen ([place]) 10-325 MG per tablet if possible wants a two month prescription. Pt is aware needs to pick up rx but want to changing her pharmacy for future rx to [place] phone [phone]. Please advise. Thank you

## 2014-08-14 MED ORDER — HYDROCODONE-ACETAMINOPHEN 10-325 MG PO TABS: 1.0000 | ORAL_TABLET | Freq: Four times a day (QID) | ORAL | Status: DC | PRN

## 2014-08-14 NOTE — Telephone Encounter (Signed)
done

## 2014-08-14 NOTE — Telephone Encounter (Signed)
LMOM informing Pt that medication is ready for pick up at front desk.

## 2014-08-18 ENCOUNTER — Ambulatory Visit: Payer: Medicare Other | Admitting: Cardiology

## 2014-08-19 ENCOUNTER — Ambulatory Visit: Payer: Medicare Other | Admitting: Cardiology

## 2014-08-28 ENCOUNTER — Encounter: Payer: Self-pay | Admitting: Cardiology

## 2014-08-28 ENCOUNTER — Ambulatory Visit (INDEPENDENT_AMBULATORY_CARE_PROVIDER_SITE_OTHER): Payer: Medicare Other | Admitting: Cardiology

## 2014-08-28 VITALS — BP 132/68 | HR 73 | Ht 64.0 in | Wt 204.8 lb

## 2014-08-28 DIAGNOSIS — I10 Essential (primary) hypertension: Secondary | ICD-10-CM | POA: Diagnosis not present

## 2014-08-28 DIAGNOSIS — I2510 Atherosclerotic heart disease of native coronary artery without angina pectoris: Secondary | ICD-10-CM

## 2014-08-28 DIAGNOSIS — R0602 Shortness of breath: Secondary | ICD-10-CM | POA: Diagnosis not present

## 2014-08-28 MED ORDER — RANITIDINE HCL 150 MG PO TABS: 150.0000 mg | ORAL_TABLET | Freq: Two times a day (BID) | ORAL | Status: DC

## 2014-08-28 NOTE — Progress Notes (Signed)
Patient ID: [name], female   DOB: [birth year], 79 y.o.   MRN: [id number]     Date:  08/28/2014   ID:  [name], DOB 1935-02-06, MRN [id number]  PCP:  [name], MD  Cardiologist:  Dr. Thompson Grayer => Dr. Ena Dawley     History of Present Illness: [name] is a 79 y.o. female with a hx of HTN, HL, T2DM, orthostatic hypotension, prior DVT after neck surgery in 2010, s/p IVC filter, GERD and reflex sympathetic dystrophy.    She had an extensive workup for dyspnea in the past.  Please see my last office visit note from 10/29/2013 for complete details.  Patient was evaluated by Dr. Rayann Heman in January for chest pain and shortness of breath.  Myoview was abnormal and cardiac catheterization was recommended. Patient was admitted 1/9-1/10. Cardiac catheterization demonstrated high-grade disease in the second diagonal which was treated with cutting balloon angioplasty.  Patient was to have GI evaluation in the near future (EGD and colonoscopy).  She had a recent history of dysphagia as well as rectal bleeding with prior history of adenomatous polyps and in situ carcinoma.  Therefore, long term commitment to dual antiplatelet Rx was to be avoided.    She has noted recurrent left-sided chest discomfort. She feels as though that this is indigestion. She has had this for years without significant change. She is fairly sedentary. She cannot tell me if she has had significant improvement with any symptoms since her angioplasty. She denies associated dyspnea. She denies exertional chest discomfort. She does take Gas-X for her chest discomfort with relief.  Overall, she describes NYHA class 2-2b symptoms.  She denies syncope. She denies significant pedal edema.  She is requesting clearance to undergo epidural steroid injections as well as EGD/colonoscopy.  Echocardiogram (10/14/13): Mild LVH, mild focal basal hypertrophy of the septum, EF 60-65%, normal wall motion, grade 1 diastolic dysfunction, MAC,  mild LAE, atrial septal aneurysm, PASP 34. There was a possible oscillating density on the mitral valve on parasternal long axis views (related to MAC).   LexiScan Myoview (10/20/13): Intermediate risk; medium-size reversible defect in the mid LAD territory, EF 68%. LHC (10/31/13): Proximal D2 95, mid circumflex 40, LVEDP 10.  PCI:  Cutting Balloon angioplasty to D2.  The patient has a long history of multiple polyps in her cecum and colon, with history of a malignant polyp many years ago. She just underwent a colonoscopy and EGD with finding of 3 polyps with removal of 2 and partial removal of a large 1. Biopsy showed tubular adenoma and she has a scheduled followup appointment with Dr. Maurene Capes to discuss further management.  She otherwise reports stable chest pain and back pain that's not related to exertion and is relieved by antacids. She denies any shortness of breath, palpitation or syncope.  06/16/2014 - this is a 3 months followup, in the interim the patient underwent EGD and colonoscopy with removal of multiple small and 1 large polyp that was associated with prolonged abdominal pain. The patient states that she didn't have bleeding but is persuaded that the pain resolved with Bactrim. She has been seeing Dr. Maurene Capes for this. She has noticed also on and off chest pain and back pain pretty much similar to the symptoms she had prior she received her stent to diagonal artery a year ago. She has stopped taking Plavix prior to her colonoscopy and hasn't restarted yet. She has stable exertional dyspnea no palpitations or syncope.  08/28/2014 -  the patient is coming after 1 months with a result of stress test. Her stress test was negative for any ischemia. She complains of significant indigestion almost after every food. Omeprazole and Gas-X skin her minimal relief. She follows with Dr. Maurene Capes for multiple polyps. She has stable dyspnea on exertion no palpitation or syncope.  Recent Labs: 09/17/2013: Direct  LDL 117.9; HDL Cholesterol by NMR 39.30; TSH 1.91 06/08/2014: ALT 12; Creatinine 0.66; Potassium 4.5 06/12/2014: Hemoglobin 13.6  Wt Readings from Last 3 Encounters:  08/28/14 204 lb 12.8 oz (92.897 kg)  06/24/14 198 lb (89.812 kg)  06/16/14 199 lb (90.266 kg)     Past Medical History  Diagnosis Date  . Hyperlipidemia     a. patient unwilling to use statins.  . Osteoporosis   . DVT (deep venous thrombosis) 12/2007  . PE (pulmonary embolism) 12/2007    a. PE/DVT after neck surgery 2009. b. coumadin d/c 10-2008.  . Pulmonary nodule     incidental per CT:  Pet scan 4-9: likely benign, CT 08-2009 no change, no further CTs (Dr. Gwenette Greet)  . Hemorrhoids   . IBS (irritable bowel syndrome)   . Diverticulosis   . PPD positive   . Macular degeneration 03/2009    Dr. Rosana Hoes  . Allergic rhinitis   . Dyspnea     a. Chronic, extensive w/u see OV note 09-2010. b. [place] 2011: ormal with RA 5 RV 31/2 PA 27/12 (19) PCW 10 CO normal. No evidence of shunting with sitting up in cath lab. c. CPX 2011: see report. d. Prior fluoro of diaphragm -  R diaphragm elevated at rest but both moved with inspiration.   Marland Kitchen Spinal stenosis   . GERD (gastroesophageal reflux disease)     a. Hx GERD/esophageal dysmotility followed by Dr. Olevia Perches.   Marland Kitchen Hypertension   . Neuropathy     a. Hands, feet, legs.  . Orthostatic hypotension   . CAD (coronary artery disease)     a. Nonobst in 2011. b. Abnormal nuc 09/2013 -> s/p cutting balloon to D2; mild LAD disease 10/31/13.  . Osteomyelitis     a. Adm 04/2013: Charcot collapse of the right foot with osteomyelitis and ulceration, s/p excision.  . Venous insufficiency     a. Contributing to LEE.  . Type II diabetes mellitus   . DJD (degenerative joint disease)   . Arthritis     "back; ankles; hands; knees" (10/31/2013)  . Recurrent UTI   . 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 1994 - malignant polyp removed during colonoscopy.  . Hiatal hernia     a. s/p Nissen fundoplication  4128.  Marland Kitchen RSD (reflex sympathetic dystrophy)     a. Chronic pain.  . Abnormal echocardiogram     09/2013: mild LVH, mild focal basal hypertrophy of septum, EF 60-65%, normal WM, grade 1 diastolic dysfunction, MAC, mild LAE, ASA, PASP 34. Possible oscillating MV density - reviewed by MD and felt there was no significant abnormality other than MAC noted with mitral valve and did not require TEE.  . Adenomatous colon polyp   . Pneumonia   . Fatty liver     Current Outpatient Prescriptions  Medication Sig Dispense Refill  . acetaminophen (TYLENOL) 500 MG tablet Take 500 mg by mouth daily as needed for mild pain or moderate pain.     . Alpha-Lipoic Acid 600 MG CAPS Take 600 mg by mouth 2 (two) times daily.    Marland Kitchen amLODipine-benazepril (LOTREL) 5-10  MG per capsule take 1 capsule by mouth once daily 30 capsule 6  . aspirin EC 81 MG tablet Take 1 tablet (81 mg total) by mouth daily.    . Boron 3 MG CAPS Take 3 mg by mouth every evening.     . Cholecalciferol (VITAMIN D3) 5000 UNITS CAPS Take 5,000 Units by mouth every evening.    . clopidogrel (PLAVIX) 75 MG tablet Take 1 tablet (75 mg total) by mouth daily. 90 tablet 6  . Cyanocobalamin (VITAMIN B-12) 2500 MCG SUBL Place 2,500 mcg under the tongue daily.    . diclofenac sodium (VOLTAREN) 1 % GEL Apply 1 application topically daily as needed (pain).    Marland Kitchen FREESTYLE LITE test strip CHECK BLOOD SUGAR TWICE A DAY 100 each 10  . hydrochlorothiazide (MICROZIDE) 12.5 MG capsule Take 12.5 mg by mouth daily.    Marland Kitchen HYDROcodone-acetaminophen (NORCO) 10-325 MG per tablet Take 1 tablet by mouth every 6 (six) hours as needed for moderate pain or severe pain. 120 tablet 0  . hydrocortisone (ANUSOL-HC) 25 MG suppository Place 25 mg rectally 2 (two) times daily as needed for hemorrhoids.     . hyoscyamine (LEVSIN/SL) 0.125 MG SL tablet Place 1 tablet (0.125 mg total) under the tongue every 4 (four) hours as needed. 30 tablet 1  . Krill Oil 300 MG CAPS Take 300 mg by  mouth every evening.     . Lancets (FREESTYLE) lancets USE TWICE A DAY 100 each 10  . meclizine (ANTIVERT) 12.5 MG tablet take 1 tablet by mouth three times a day if needed 90 tablet 1  . metoprolol succinate (TOPROL-XL) 25 MG 24 hr tablet Take 1 tablet (25 mg total) by mouth daily. 30 tablet 6  . Multiple Vitamins-Minerals (PRESERVISION AREDS 2 PO) Take 2 tablets by mouth daily.    . nabumetone (RELAFEN) 750 MG tablet Take 750 mg by mouth 2 (two) times daily.    . nitroGLYCERIN (NITROSTAT) 0.4 MG SL tablet Place 1 tablet (0.4 mg total) under the tongue every 5 (five) minutes as needed for chest pain. 90 tablet 3  . nortriptyline (PAMELOR) 10 MG capsule Take 10 mg by mouth daily.    Marland Kitchen omeprazole (PRILOSEC) 20 MG capsule Take 1 capsule (20 mg total) by mouth 2 (two) times daily. 180 capsule 1  . OVER THE COUNTER MEDICATION Take 1 capsule by mouth 2 (two) times daily. Integrative Digestive Formula    . polyethylene glycol powder (GLYCOLAX/MIRALAX) powder Take 17 g by mouth daily. Until daily soft stools  OTC 255 g 0  . Polyvinyl Alcohol-Povidone (REFRESH OP) Place 1 drop into both eyes daily as needed (dry eyes).    . Probiotic Product (PROBIOTIC DAILY PO) Take 1 capsule by mouth daily.    Marland Kitchen sulfamethoxazole-trimethoprim (SEPTRA DS) 800-160 MG per tablet Take 1 tablet by mouth 2 (two) times daily. 42 tablet 0   No current facility-administered medications for this visit.    Allergies:   Cymbalta; Gabapentin; Adhesive; Cefazolin; Ciprofloxacin; Clorazepate dipotassium; Darifenacin hydrobromide; Dilaudid; Doxycycline; Enablex; Levofloxacin; Lyrica; Methadone hcl; Morphine and related; Pentazocine lactate; and [place]win   Social History:  The patient  reports that she has never smoked. She has never used smokeless tobacco. She reports that she does not drink alcohol or use illicit drugs.   Family History:  The patient's family history includes Breast cancer in an other family member; Diabetic kidney  disease in her daughter; Headache in her sister; Heart disease in her mother; Hypertension in an  other family member; Migraines in her sister; Rheum arthritis in her mother. There is no history of Colon cancer, Esophageal cancer, Stomach cancer, or Rectal cancer.   ROS:  Please see the history of present illness.   All other systems reviewed and negative.   PHYSICAL EXAM: VS:  BP 132/68 mmHg  Pulse 73  Ht 5\' 4"  (1.626 m)  Wt 204 lb 12.8 oz (92.897 kg)  BMI 35.14 kg/m2 Well nourished, well developed, in no acute distress HEENT: normal Neck: no JVD at 90 Cardiac:  normal S1, S2; RRR; no murmur Lungs:  clear to auscultation bilaterally, no wheezing, rhonchi or rales Abd: soft, nontender, no hepatomegaly Ext: trace bilateral LE edema right wrist without hematoma or mass  Skin: warm and dry Neuro:  CNs 2-12 intact, no focal abnormalities noted  EKG:  NSR, HR 74, normal axis, nonspecific ST-T wave changes   ASSESSMENT AND PLAN:  1. Chest Pain:  Negative stress test, it seems to be related to her GI problems. She is currently experiencing a lot of burning gas and right lower quadrant pain. She is advised to follow with Dr. Maurene Capes. We will add ranitidine to her omeprazole. She will continue aspirin and beta blocker. She is intolerant to statins.She is inquiring about stopping Plavix for spinal shots. She is advised she can stop use using it at the end of this year. 2. Hypertension:  Controlled. 3. Hyperlipidemia - she is intolerant to statins. 4. Disposition:  F/u  In 6 months.    Signed, Dorothy Spark, MD  08/28/2014 10:23 AM

## 2014-08-28 NOTE — Patient Instructions (Signed)
Your physician has recommended you make the following change in your medication:   START TAKING RANITIDINE (ZANTAC) 150 MG TWICE DAILY    Your physician wants you to follow-up in: [place] will receive a reminder letter in the mail two months in advance. If you don't receive a letter, please call our office to schedule the follow-up appointment.

## 2014-09-14 ENCOUNTER — Other Ambulatory Visit: Payer: Self-pay

## 2014-09-23 ENCOUNTER — Telehealth: Payer: Self-pay | Admitting: Internal Medicine

## 2014-09-23 NOTE — Telephone Encounter (Signed)
Saw a commercial on TV about Xifaxan and wonders if it would be helpful for her IBS. Patient had a colonoscopy 01/28/2014 at which time several polyps were removed. Apparently, 1 polyp was unable to be removed and a hemicolectomy was discussed. Patient opted for surveillance in 1 year rather than going through with hemicolectomy. She says that since the procedure, she has had right sided abdominal pain radiating to naval and the pain at times feels like "a knife going through". She notes alternating constipation/diarrhea. Is concerned about the pain being caused by infection. I explained that we typically use Xifaxan for bacterial overgrowth or diarrhea predominant IBS, however, I would get Dr Nichola Sizer recommendations.Marland KitchenMarland Kitchen

## 2014-09-23 NOTE — Telephone Encounter (Signed)
True, Xifaxin is for bacterial overgrowth but not for colon infection. She may take a course of Xifaxin 550 mg po bid x 10 days for IBS, OK to call in. As far as the RLQ abd pain is concerned, we have been discussing possibility of laparoscopically assisted right hemicolectomy to prevent colon cancer from ascending colon polyps which were deep and only partially removed. She should  Make an appointment to discuss.

## 2014-09-24 MED ORDER — RIFAXIMIN 550 MG PO TABS: 550.0000 mg | ORAL_TABLET | Freq: Two times a day (BID) | ORAL | Status: DC

## 2014-09-24 NOTE — Telephone Encounter (Signed)
Left message for patient to call back  

## 2014-09-24 NOTE — Telephone Encounter (Signed)
Dr Nichola Sizer recommendations discussed with patient. She verbalizes understanding and has scheduled an appointment with Dr Olevia Perches to again discuss hemicolectomy. I have also sent Xifaxan to patient's pharmacy.

## 2014-09-25 ENCOUNTER — Other Ambulatory Visit: Payer: Self-pay

## 2014-09-30 DIAGNOSIS — M545 Low back pain: Secondary | ICD-10-CM | POA: Diagnosis not present

## 2014-09-30 DIAGNOSIS — M5414 Radiculopathy, thoracic region: Secondary | ICD-10-CM | POA: Diagnosis not present

## 2014-09-30 DIAGNOSIS — M47814 Spondylosis without myelopathy or radiculopathy, thoracic region: Secondary | ICD-10-CM | POA: Diagnosis not present

## 2014-10-01 ENCOUNTER — Encounter: Payer: Medicare Other | Admitting: Internal Medicine

## 2014-10-01 ENCOUNTER — Encounter (HOSPITAL_COMMUNITY): Payer: Self-pay | Admitting: Interventional Cardiology

## 2014-10-02 NOTE — Telephone Encounter (Signed)
[employer] has denied coverage for Xifaxan. I have left a message advising patient of this.

## 2014-10-06 ENCOUNTER — Encounter: Payer: Self-pay | Admitting: Internal Medicine

## 2014-10-06 ENCOUNTER — Ambulatory Visit (INDEPENDENT_AMBULATORY_CARE_PROVIDER_SITE_OTHER): Payer: Medicare Other | Admitting: Internal Medicine

## 2014-10-06 ENCOUNTER — Ambulatory Visit (INDEPENDENT_AMBULATORY_CARE_PROVIDER_SITE_OTHER): Payer: Medicare Other

## 2014-10-06 VITALS — BP 128/62 | HR 79 | Temp 97.9°F | Ht 64.0 in | Wt 204.2 lb

## 2014-10-06 DIAGNOSIS — G9050 Complex regional pain syndrome I, unspecified: Secondary | ICD-10-CM

## 2014-10-06 DIAGNOSIS — I10 Essential (primary) hypertension: Secondary | ICD-10-CM

## 2014-10-06 DIAGNOSIS — E119 Type 2 diabetes mellitus without complications: Secondary | ICD-10-CM

## 2014-10-06 DIAGNOSIS — I2510 Atherosclerotic heart disease of native coronary artery without angina pectoris: Secondary | ICD-10-CM | POA: Diagnosis not present

## 2014-10-06 DIAGNOSIS — Z23 Encounter for immunization: Secondary | ICD-10-CM | POA: Diagnosis not present

## 2014-10-06 DIAGNOSIS — Z0000 Encounter for general adult medical examination without abnormal findings: Secondary | ICD-10-CM

## 2014-10-06 DIAGNOSIS — L03116 Cellulitis of left lower limb: Secondary | ICD-10-CM | POA: Diagnosis not present

## 2014-10-06 DIAGNOSIS — L89891 Pressure ulcer of other site, stage 1: Secondary | ICD-10-CM | POA: Diagnosis not present

## 2014-10-06 DIAGNOSIS — E1142 Type 2 diabetes mellitus with diabetic polyneuropathy: Secondary | ICD-10-CM | POA: Diagnosis not present

## 2014-10-06 DIAGNOSIS — L02612 Cutaneous abscess of left foot: Secondary | ICD-10-CM | POA: Diagnosis not present

## 2014-10-06 LAB — BASIC METABOLIC PANEL
BUN: 20 mg/dL (ref 6–23)
CO2: 23 mEq/L (ref 19–32)
Calcium: 9.3 mg/dL (ref 8.4–10.5)
Chloride: 102 mEq/L (ref 96–112)
Creatinine, Ser: 1.3 mg/dL — ABNORMAL HIGH (ref 0.4–1.2)
GFR: 41.81 mL/min — ABNORMAL LOW (ref >60.00)
Glucose, Bld: 105 mg/dL — ABNORMAL HIGH (ref 70–99)
Potassium: 4.9 mEq/L (ref 3.5–5.1)
Sodium: 134 mEq/L — ABNORMAL LOW (ref 135–145)

## 2014-10-06 LAB — HEMOGLOBIN A1C: Hgb A1c MFr Bld: 6.9 % — ABNORMAL HIGH (ref 4.6–6.5)

## 2014-10-06 MED ORDER — HYDROCODONE-ACETAMINOPHEN 10-325 MG PO TABS
1.0000 | ORAL_TABLET | Freq: Four times a day (QID) | ORAL | Status: DC | PRN
Start: 2014-10-06 — End: 2014-10-06

## 2014-10-06 MED ORDER — HYDROCODONE-ACETAMINOPHEN 10-325 MG PO TABS: 1.0000 | ORAL_TABLET | Freq: Four times a day (QID) | ORAL | Status: DC | PRN

## 2014-10-06 NOTE — Assessment & Plan Note (Signed)
Refill hydrocodone, she takes ~ 4 tabs a day, I'm concerned about Tylenol overdose however she very seldom takes a OTC Tylenol. UDS low risk 05-2014

## 2014-10-06 NOTE — Progress Notes (Signed)
Pre visit review using our clinic review tool, if applicable. No additional management support is needed unless otherwise documented below in the visit note. 

## 2014-10-06 NOTE — Patient Instructions (Signed)
Get your blood work before you leave     Please come back to the office in 4 months  for a routine check up      [place] cause injuries and can affect all age groups. It is possible to use preventive measures to significantly decrease the likelihood of falls. There are many simple measures which can make your home safer and prevent falls. OUTDOORS  Repair cracks and edges of walkways and driveways.  Remove high doorway thresholds.  Trim shrubbery on the main path into your home.  Have good outside lighting.  Clear walkways of tools, rocks, debris, and clutter.  Check that handrails are not broken and are securely fastened. Both sides of steps should have handrails.  Have leaves, snow, and ice cleared regularly.  Use sand or salt on walkways during winter months.  In the garage, clean up grease or oil spills. BATHROOM  Install night lights.  Install grab bars by the toilet and in the tub and shower.  Use non-skid mats or decals in the tub or shower.  Place a plastic non-slip stool in the shower to sit on, if needed.  Keep floors dry and clean up all water on the floor immediately.  Remove soap buildup in the tub or shower on a regular basis.  Secure bath mats with non-slip, double-sided rug tape.  Remove throw rugs and tripping hazards from the floors. BEDROOMS  Install night lights.  Make sure a bedside light is easy to reach.  Do not use oversized bedding.  Keep a telephone by your bedside.  Have a firm chair with side arms to use for getting dressed.  Remove throw rugs and tripping hazards from the floor. KITCHEN  Keep handles on pots and pans turned toward the center of the stove. Use back burners when possible.  Clean up spills quickly and allow time for drying.  Avoid walking on wet floors.  Avoid hot utensils and knives.  Position shelves so they are not too high or low.  Place commonly used objects within easy  reach.  If necessary, use a sturdy step stool with a grab bar when reaching.  Keep electrical cables out of the way.  Do not use floor polish or wax that makes floors slippery. If you must use wax, use non-skid floor wax.  Remove throw rugs and tripping hazards from the floor. STAIRWAYS  Never leave objects on stairs.  Place handrails on both sides of stairways and use them. Fix any loose handrails. Make sure handrails on both sides of the stairways are as long as the stairs.  Check carpeting to make sure it is firmly attached along stairs. Make repairs to worn or loose carpet promptly.  Avoid placing throw rugs at the top or bottom of stairways, or properly secure the rug with carpet tape to prevent slippage. Get rid of throw rugs, if possible.  Have an electrician put in a light switch at the top and bottom of the stairs. OTHER FALL PREVENTION TIPS  Wear low-heel or rubber-soled shoes that are supportive and fit well. Wear closed toe shoes.  When using a stepladder, make sure it is fully opened and both spreaders are firmly locked. Do not climb a closed stepladder.  Add color or contrast paint or tape to grab bars and handrails in your home. Place contrasting color strips on first and last steps.  Learn and use mobility aids as needed. Install an electrical emergency response system.  Turn  on lights to avoid dark areas. Replace light bulbs that burn out immediately. Get light switches that glow.  Arrange furniture to create clear pathways. Keep furniture in the same place.  Firmly attach carpet with non-skid or double-sided tape.  Eliminate uneven floor surfaces.  Select a carpet pattern that does not visually hide the edge of steps.  Be aware of all pets. OTHER HOME SAFETY TIPS  Set the water temperature for 120 F (48.8 C).  Keep emergency numbers on or near the telephone.  Keep smoke detectors on every level of the home and near sleeping areas. Document Released:  09/29/2002 Document Revised: 04/09/2012 Document Reviewed: 12/29/2011 [facility] Patient Information 2015 [place], [place]. This information is not intended to replace advice given to you by your health care provider. Make sure you discuss any questions you have with your health care provider.       Ages 73 years and over, routine care   Blood pressure check.** / Every 1 to 2 years.  Lipid and cholesterol check.** / Every 5 years beginning at age 2 years.  Lung cancer screening. / Every year if you are aged 74-80 years and have a 30-pack-year history of smoking and currently smoke or have quit within the past 15 years. Yearly screening is stopped once you have quit smoking for at least 15 years or develop a health problem that would prevent you from having lung cancer treatment.  Clinical breast exam.** / Every year after age 42 years.  BRCA-related cancer risk assessment.** / For women who have family members with a BRCA-related cancer (breast, ovarian, tubal, or peritoneal cancers).  Mammogram.** / Every year beginning at age 35 years and continuing for as long as you are in good health. Consult with your health care provider.  Pap test.** / Every 3 years starting at age 35 years through age 41 or 81 years with 3 consecutive normal Pap tests. Testing can be stopped between 65 and 70 years with 3 consecutive normal Pap tests and no abnormal Pap or HPV tests in the past 10 years.  HPV screening.** / Every 3 years from ages 50 years through ages 38 or 71 years with a history of 3 consecutive normal Pap tests. Testing can be stopped between 65 and 70 years with 3 consecutive normal Pap tests and no abnormal Pap or HPV tests in the past 10 years.  Fecal occult blood test (FOBT) of stool. / Every year beginning at age 69 years and continuing until age 23 years. You may not need to do this test if you get a colonoscopy every 10 years.  Flexible sigmoidoscopy or colonoscopy.** / Every 5 years for  a flexible sigmoidoscopy or every 10 years for a colonoscopy beginning at age 44 years and continuing until age 27 years.  Hepatitis C blood test.** / For all people born from [birth year] through 1963 and any individual with known risks for hepatitis C.  Osteoporosis screening.** / A one-time screening for women ages 65 years and over and women at risk for fractures or osteoporosis.  Skin self-exam. / Monthly.  Influenza vaccine. / Every year.  Tetanus, diphtheria, and acellular pertussis (Tdap/Td) vaccine.** / 1 dose of Td every 10 years.  Varicella vaccine.** / Consult your health care provider.  Zoster vaccine.** / 1 dose for adults aged 65 years or older.  Pneumococcal 13-valent conjugate (PCV13) vaccine.** / Consult your health care provider.  Pneumococcal polysaccharide (PPSV23) vaccine.** / 1 dose for all adults aged 59 years and older.  Meningococcal vaccine.** / Consult your health care provider.  Hepatitis A vaccine.** / Consult your health care provider.  Hepatitis B vaccine.** / Consult your health care provider.  Haemophilus influenzae type b (Hib) vaccine.** / Consult your health care provider. ** Family history and personal history of risk and conditions may change your health care provider's recommendations. Document Released: 12/05/2001 Document Revised: 02/23/2014 Document Reviewed: 03/06/2011 [facility] Patient Information 2015 [place], [place]. This information is not intended to replace advice given to you by your health care provider. Make sure you discuss any questions you have with your health care provider.

## 2014-10-06 NOTE — Assessment & Plan Note (Signed)
most recent stress test negative, on Plavix until the end of the year

## 2014-10-06 NOTE — Progress Notes (Signed)
Subjective:    Patient ID: DE LIBMAN, female    DOB: 11/16/1934, 79 y.o.   MRN: [id number]  DOS:  10/06/2014 Type of visit - description :   Here for Medicare AWV:   1. Risk factors based on Past M, S, F history: reviewed   2. Physical Activities: sedentary    3. Depression/mood: neg screening , doing ok  4. Hearing: No problemss noted or reported , although hearing slt decreased compared to years ago 5. ADL's: Independent, not driving since ~ 1856 6. Fall Risk: high risk, counseled   7. home Safety: does feelsafe at home   8. Height, weight, &visual acuity: see VS, no problems w/ vision, sees the eye MD   9. Counseling: provided   10. Labs ordered based on risk factors: if needed   11. Referral Coordination: if needed   12. Care Plan, see assessment and plan   13. Cognitive Assessment: cognition normal, motor skills diminished , see PMH  14. Care team updated 15. Written care plan provided  We also discussed the following CAD, saw cardiology 08/28/2014, had a negative stress test, felt to be stable. Pain management, needs a refill for  hydrocodone. She takes an average 3-4 tablets a day, she also takes Tylenol but very rarely. Hypertension, good compliance with medications, ambulatory BPs normal. Recurrent UTI, on daily antibiotics, currently asymptomatic Abdominal pain, still an issue, closely follow-up by GI.  ROS Denies chest pain or difficulty breathing No nausea or vomiting, occasional diarrhea and constipation. No dysuria, gross hematuria difficulty urinating at this time  Past Medical History  Diagnosis Date  . Hyperlipidemia     a. patient unwilling to use statins.  . Osteoporosis   . DVT (deep venous thrombosis) 12/2007  . PE (pulmonary embolism) 12/2007    a. PE/DVT after neck surgery 2009. b. coumadin d/c 10-2008.  . Pulmonary nodule     incidental per CT:  Pet scan 4-9: likely benign, CT 08-2009 no change, no further CTs (Dr. Gwenette Greet)  . Hemorrhoids   .  IBS (irritable bowel syndrome)   . Diverticulosis   . PPD positive   . Macular degeneration 03/2009    Dr. Rosana Hoes  . Allergic rhinitis   . Dyspnea     a. Chronic, extensive w/u see OV note 09-2010. b. [place] 2011: ormal with RA 5 RV 31/2 PA 27/12 (19) PCW 10 CO normal. No evidence of shunting with sitting up in cath lab. c. CPX 2011: see report. d. Prior fluoro of diaphragm -  R diaphragm elevated at rest but both moved with inspiration.   Marland Kitchen Spinal stenosis   . GERD (gastroesophageal reflux disease)     a. Hx GERD/esophageal dysmotility followed by Dr. Olevia Perches.   Marland Kitchen Hypertension   . Neuropathy     a. Hands, feet, legs.  . Orthostatic hypotension   . CAD (coronary artery disease)     a. Nonobst in 2011. b. Abnormal nuc 09/2013 -> s/p cutting balloon to D2; mild LAD disease 10/31/13.  . Osteomyelitis     a. Adm 04/2013: Charcot collapse of the right foot with osteomyelitis and ulceration, s/p excision.  . Venous insufficiency     a. Contributing to LEE.  . Type II diabetes mellitus   . DJD (degenerative joint disease)   . Arthritis     "back; ankles; hands; knees" (10/31/2013)  . Recurrent UTI   . 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 1994 - malignant polyp  removed during colonoscopy.  . Hiatal hernia     a. s/p Nissen fundoplication 8841.  Marland Kitchen RSD (reflex sympathetic dystrophy)     a. Chronic pain.  . Abnormal echocardiogram     09/2013: mild LVH, mild focal basal hypertrophy of septum, EF 60-65%, normal WM, grade 1 diastolic dysfunction, MAC, mild LAE, ASA, PASP 34. Possible oscillating MV density - reviewed by MD and felt there was no significant abnormality other than MAC noted with mitral valve and did not require TEE.  . Adenomatous colon polyp   . Pneumonia   . Fatty liver     Past Surgical History  Procedure Laterality Date  . Nissen fundoplication  03/28/629  . Cholecystectomy  03/2002  . Cataract extraction w/ intraocular lens  implant, bilateral  2004    feb 2004 left, aug 2004  right  . Rotator cuff repair Right 07/2007    Dr. Percell Miller  . Anterior cervical decomp/discectomy fusion  12/18/07    For OA,  Dr. Lorin Mercy:  fu by a PE  . Toe amputation Right 08/2008    3rd toe, Dr. Sharol Given due to osteomyelitis  . Nasal septum surgery  09/1963  . Vein ligation Bilateral 03/1966  . Carpal tunnel release Left 09/2011  . Amputation  03/06/2012    Procedure: AMPUTATION FOOT;  Surgeon: Newt Minion, MD;  Location: [place];  Service: Orthopedics;  Laterality: Left;  FIFTH RAY AMPUTATION   . Vena cava filter placement  01/2010    green filter; "due to blood clots"  . Ankle fusion  09/27/2012    Procedure: ANKLE FUSION;  Surgeon: Newt Minion, MD;  Location: [place];  Service: Orthopedics;  Laterality: Left;  Left Tibiocalcaneal Fusion  . Ankle fusion Right 05/09/2013    Procedure: ANKLE FUSION;  Surgeon: Newt Minion, MD;  Location: [place];  Service: Orthopedics;  Laterality: Right;  Excision Osteomyelitis Base 1st MT Right Foot, Fusion Medial Column  . Cardiac catheterization  05/2010     at [facility]  . Coronary angioplasty  10/31/2013  . Vaginal hysterectomy  03/1975  . Ankle surgery Left apr & june 1991  . Cyst removal hand  06/2003  . Rotator cuff repair Left 11/2004  . Wisdom tooth extraction  06/2007  . Foot bone excision Right 06/2009  . Left heart catheterization with coronary angiogram N/A 10/31/2013    Procedure: LEFT HEART CATHETERIZATION WITH CORONARY ANGIOGRAM;  Surgeon: Jettie Booze, MD;  Location: [facility] CATH LAB;  Service: Cardiovascular;  Laterality: N/A;  . Percutaneous coronary intervention-balloon only  10/31/2013    Procedure: PERCUTANEOUS CORONARY INTERVENTION-BALLOON ONLY;  Surgeon: Jettie Booze, MD;  Location: [facility] CATH LAB;  Service: Cardiovascular;;   Family History  Problem Relation Age of Onset  . Migraines Sister   . Hypertension    . Heart disease Mother     mitral valve replaced  . Rheum arthritis Mother   . Colon cancer Neg Hx   . Esophageal cancer  Neg Hx   . Stomach cancer Neg Hx   . Rectal cancer Neg Hx   . Diabetic kidney disease Daughter   . Breast cancer    . Headache Sister     History   Social History  . Marital Status: Divorced    Spouse Name: N/A    Number of Children: 3  . Years of Education: N/A   Occupational History  . Retired      from [employer]    Social History  Main Topics  . Smoking status: Never Smoker   . Smokeless tobacco: Never Used     Comment: tried for a few months in college.   . Alcohol Use: No  . Drug Use: No  . Sexual Activity: No   Other Topics Concern  . Not on file   Social History Narrative   1 daughter lives w/ her , another daughter lives in town, 1 son in [place]                  Medication List       This list is accurate as of: 10/06/14  6:54 PM.  Always use your most recent med list.               acetaminophen 500 MG tablet  Commonly known as:  TYLENOL  Take 500 mg by mouth daily as needed for mild pain or moderate pain.     Alpha-Lipoic Acid 600 MG Caps  Take 600 mg by mouth 2 (two) times daily.     amLODipine-benazepril 5-10 MG per capsule  Commonly known as:  LOTREL  take 1 capsule by mouth once daily     aspirin EC 81 MG tablet  Take 1 tablet (81 mg total) by mouth daily.     Boron 3 MG Caps  Take 3 mg by mouth every evening.     clopidogrel 75 MG tablet  Commonly known as:  PLAVIX  Take 1 tablet (75 mg total) by mouth daily.     diclofenac sodium 1 % Gel  Commonly known as:  VOLTAREN  Apply 1 application topically daily as needed (pain).     freestyle lancets  USE TWICE A DAY     FREESTYLE LITE test strip  Generic drug:  glucose blood  CHECK BLOOD SUGAR TWICE A DAY     hydrochlorothiazide 12.5 MG capsule  Commonly known as:  MICROZIDE  Take 12.5 mg by mouth daily.     HYDROcodone-acetaminophen 10-325 MG per tablet  Commonly known as:  NORCO  Take 1 tablet by mouth every 6 (six) hours as needed for moderate pain or severe pain.       hydrocortisone 25 MG suppository  Commonly known as:  ANUSOL-HC  Place 25 mg rectally 2 (two) times daily as needed for hemorrhoids.     hyoscyamine 0.125 MG SL tablet  Commonly known as:  LEVSIN/SL  Place 1 tablet (0.125 mg total) under the tongue every 4 (four) hours as needed.     Krill Oil 300 MG Caps  Take 300 mg by mouth every evening.     meclizine 12.5 MG tablet  Commonly known as:  ANTIVERT  take 1 tablet by mouth three times a day if needed     metoprolol succinate 25 MG 24 hr tablet  Commonly known as:  TOPROL-XL  Take 1 tablet (25 mg total) by mouth daily.     nabumetone 750 MG tablet  Commonly known as:  RELAFEN  Take 750 mg by mouth 2 (two) times daily.     nitroGLYCERIN 0.4 MG SL tablet  Commonly known as:  NITROSTAT  Place 1 tablet (0.4 mg total) under the tongue every 5 (five) minutes as needed for chest pain.     nortriptyline 10 MG capsule  Commonly known as:  PAMELOR  Take 10 mg by mouth daily.     omeprazole 20 MG capsule  Commonly known as:  PRILOSEC  Take 1 capsule (20 mg total) by mouth  2 (two) times daily.     OVER THE COUNTER MEDICATION  Take 1 capsule by mouth 2 (two) times daily. Integrative Digestive Formula     polyethylene glycol powder powder  Commonly known as:  GLYCOLAX/MIRALAX  - Take 17 g by mouth daily. Until daily soft stools  -   - OTC     PRESERVISION AREDS 2 PO  Take 2 tablets by mouth daily.     PROBIOTIC DAILY PO  Take 1 capsule by mouth daily.     ranitidine 150 MG tablet  Commonly known as:  ZANTAC  Take 1 tablet (150 mg total) by mouth 2 (two) times daily.     REFRESH OP  Place 1 drop into both eyes daily as needed (dry eyes).     sulfamethoxazole-trimethoprim 800-160 MG per tablet  Commonly known as:  SEPTRA DS  Take 1 tablet by mouth 2 (two) times daily.     Vitamin B-12 2500 MCG Subl  Place 2,500 mcg under the tongue daily.     Vitamin D3 5000 UNITS Caps  Take 5,000 Units by mouth every evening.            Objective:   Physical Exam BP 128/62 mmHg  Pulse 79  Temp(Src) 97.9 F (36.6 C) (Oral)  Ht 5\' 4"  (1.626 m)  Wt 204 lb 4 oz (92.647 kg)  BMI 35.04 kg/m2  SpO2 98% General -- alert, well-developed, NAD.  Neck --no thyromegaly Lungs -- normal respiratory effort, no intercostal retractions, no accessory muscle use, and normal breath sounds.  Heart-- normal rate, regular rhythm, no murmur.  Extremities--  Trace pretibial edema bilaterally  Neurologic--  alert & oriented X3. Speech normal, walks w/ difficulty d/t orthopedic issues  Psych-- Cognition and judgment appear intact. Cooperative with normal attention span and concentration. No anxious or depressed appearing.        Assessment & Plan:   Diabetes,  on diet control, check A1 Hypertension,  check a BMP, symptoms well controlled, continue with hydrochlorothiazide, Toprol-XL 25 daily, Lotrel.

## 2014-10-06 NOTE — Assessment & Plan Note (Addendum)
Td 2014    flu shot  today 2011 pneumonia shot  prevnar-- today Had a shingles shot   Palpable aorta, u/s 10-2011 neg for AAA   HX of Malignant Colon Polyp in 1994, colonoscopy   8-10, and again 05-2014, next per GI  Likes to see a gynecologist --- referral  Last mammogram 03-2014 neg Diet and exercise discussed

## 2014-10-07 ENCOUNTER — Encounter: Payer: Self-pay | Admitting: Internal Medicine

## 2014-10-08 ENCOUNTER — Telehealth: Payer: Self-pay

## 2014-10-08 NOTE — Telephone Encounter (Signed)
Pt returned call, requesting I fax latest lab results to her urologist Dr. Alger Simons. Informed her I would print them and fax them to him. Fax number: [phone].

## 2014-10-14 DIAGNOSIS — N3020 Other chronic cystitis without hematuria: Secondary | ICD-10-CM | POA: Diagnosis not present

## 2014-10-29 DIAGNOSIS — L02612 Cutaneous abscess of left foot: Secondary | ICD-10-CM | POA: Diagnosis not present

## 2014-10-29 DIAGNOSIS — L89891 Pressure ulcer of other site, stage 1: Secondary | ICD-10-CM | POA: Diagnosis not present

## 2014-10-29 DIAGNOSIS — E1142 Type 2 diabetes mellitus with diabetic polyneuropathy: Secondary | ICD-10-CM | POA: Diagnosis not present

## 2014-10-29 DIAGNOSIS — L03116 Cellulitis of left lower limb: Secondary | ICD-10-CM | POA: Diagnosis not present

## 2014-10-30 ENCOUNTER — Encounter: Payer: Self-pay | Admitting: Internal Medicine

## 2014-10-30 ENCOUNTER — Ambulatory Visit (INDEPENDENT_AMBULATORY_CARE_PROVIDER_SITE_OTHER): Payer: Medicare Other | Admitting: Internal Medicine

## 2014-10-30 VITALS — BP 132/76 | HR 92 | Ht 63.0 in | Wt 201.5 lb

## 2014-10-30 DIAGNOSIS — R1031 Right lower quadrant pain: Secondary | ICD-10-CM | POA: Diagnosis not present

## 2014-10-30 DIAGNOSIS — D126 Benign neoplasm of colon, unspecified: Secondary | ICD-10-CM

## 2014-10-30 NOTE — Patient Instructions (Addendum)
You have been scheduled for an appointment with Dr. Hassell Done at [facility]. Your appointment is on 11/27/14 at 7:09GG.EZMO certain to bring a list of current medications, including any over the counter medications or vitamins. Also bring your co-pay if you have one as well as your insurance cards. [facility] is located at [address], [address]. Should you need to reschedule your appointment, please contact them at [phone].  cc: Johnathan Hausen, MD, Dr Larose Kells

## 2014-10-30 NOTE — Progress Notes (Signed)
[name] 08-29-1935 [id number]  Note: This dictation was prepared with Dragon digital system. Any transcriptional errors that result from this procedure are unintentional.   History of Present Illness: This is a  80 year old white female with history of dysplastic colon polyps adenocarcinoma in situ in a polyp in 1994. She is here to discuss possible right hemicolectomy. She has currently a cecal polyp and a descending colon polyp which we attempted to remove on colonoscopy in April 2015  adenoma but unfortunately patient had a post-polypectomy burn and had to be hospitalized .She did not require  surgery. The polyp was carpeted and deep and was not removed entirely. The cecal polyp was villoadenomatous and the ascending colon polyp was adenomatous. It tattooed both polyps  and also placed  endoclips   She since then has had right lower  quadrant abdominal pain, but CT scans of the abdomen fail to show any inflammatory changes in the right colon.. I recommended the right hemicolectomy to avoid future risk of colon cancer aortic stenosis mentioned above.She agrees with the plan. She had numerous colonoscopies starting in 1985. 1994 when she had one and half centimeter polyp at 15 cm with invasive adenocarcinoma. As well as ascending colon polyp which was adenomatous in March 1994 flexible sigmoidoscopy showed complete resolution of the sigmoid polyp. In September 1997 colonoscopy showed cecal polyp which was hyperplastic. In March 2002 colonoscopy again showed cecal polyp which was tubovillous adenoma. In May 2007 she descending colon polyp which was adenomatous. In August 2010 colonoscopy showed cecal and ascending colon tubular adenomas. In April 2015 she had a tubulovillous adenoma of the cecum and tubular adenoma of the ascending colon.as mentioned above.  She has a history of gastroesophageal reflux and Nissan fundoplication by Dr. Hassell Done. She has been satisfied with the results. She has  low back  pain for which she has been undergoing injections. and has been ambulating with walker  due to RSD.    Past Medical History  Diagnosis Date  . Hyperlipidemia     a. patient unwilling to use statins.  . Osteoporosis   . DVT (deep venous thrombosis) 12/2007  . PE (pulmonary embolism) 12/2007    a. PE/DVT after neck surgery 2009. b. coumadin d/c 10-2008.  . Pulmonary nodule     incidental per CT:  Pet scan 4-9: likely benign, CT 08-2009 no change, no further CTs (Dr. Gwenette Greet)  . Hemorrhoids   . IBS (irritable bowel syndrome)   . Diverticulosis   . PPD positive   . Macular degeneration 03/2009    Dr. Rosana Hoes  . Allergic rhinitis   . Dyspnea     a. Chronic, extensive w/u see OV note 09-2010. b. [place] 2011: ormal with RA 5 RV 31/2 PA 27/12 (19) PCW 10 CO normal. No evidence of shunting with sitting up in cath lab. c. CPX 2011: see report. d. Prior fluoro of diaphragm -  R diaphragm elevated at rest but both moved with inspiration.   Marland Kitchen Spinal stenosis   . GERD (gastroesophageal reflux disease)     a. Hx GERD/esophageal dysmotility followed by Dr. Olevia Perches.   Marland Kitchen Hypertension   . Neuropathy     a. Hands, feet, legs.  . Orthostatic hypotension   . CAD (coronary artery disease)     a. Nonobst in 2011. b. Abnormal nuc 09/2013 -> s/p cutting balloon to D2; mild LAD disease 10/31/13.  . Osteomyelitis     a. Adm 04/2013: Charcot collapse of the right foot with  osteomyelitis and ulceration, s/p excision.  . Venous insufficiency     a. Contributing to LEE.  . Type II diabetes mellitus   . DJD (degenerative joint disease)   . Arthritis     "back; ankles; hands; knees" (10/31/2013)  . Recurrent UTI   . 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 1994 - malignant polyp removed during colonoscopy.  . Hiatal hernia     a. s/p Nissen fundoplication 1740.  Marland Kitchen RSD (reflex sympathetic dystrophy)     a. Chronic pain.  . Abnormal echocardiogram     09/2013: mild LVH, mild focal basal hypertrophy of septum, EF 60-65%,  normal WM, grade 1 diastolic dysfunction, MAC, mild LAE, ASA, PASP 34. Possible oscillating MV density - reviewed by MD and felt there was no significant abnormality other than MAC noted with mitral valve and did not require TEE.  . Adenomatous colon polyp   . Pneumonia   . Fatty liver     Past Surgical History  Procedure Laterality Date  . Nissen fundoplication  05/23/4480  . Cholecystectomy  03/2002  . Cataract extraction w/ intraocular lens  implant, bilateral  2004    feb 2004 left, aug 2004 right  . Rotator cuff repair Right 07/2007    Dr. Percell Miller  . Anterior cervical decomp/discectomy fusion  12/18/07    For OA,  Dr. Lorin Mercy:  fu by a PE  . Toe amputation Right 08/2008    3rd toe, Dr. Sharol Given due to osteomyelitis  . Nasal septum surgery  09/1963  . Vein ligation Bilateral 03/1966  . Carpal tunnel release Left 09/2011  . Amputation  03/06/2012    Procedure: AMPUTATION FOOT;  Surgeon: Newt Minion, MD;  Location: [place];  Service: Orthopedics;  Laterality: Left;  FIFTH RAY AMPUTATION   . Vena cava filter placement  01/2010    green filter; "due to blood clots"  . Ankle fusion  09/27/2012    Procedure: ANKLE FUSION;  Surgeon: Newt Minion, MD;  Location: [place];  Service: Orthopedics;  Laterality: Left;  Left Tibiocalcaneal Fusion  . Ankle fusion Right 05/09/2013    Procedure: ANKLE FUSION;  Surgeon: Newt Minion, MD;  Location: [place];  Service: Orthopedics;  Laterality: Right;  Excision Osteomyelitis Base 1st MT Right Foot, Fusion Medial Column  . Cardiac catheterization  05/2010     at [facility]  . Coronary angioplasty  10/31/2013  . Vaginal hysterectomy  03/1975  . Ankle surgery Left apr & june 1991  . Cyst removal hand  06/2003  . Rotator cuff repair Left 11/2004  . Wisdom tooth extraction  06/2007  . Foot bone excision Right 06/2009  . Left heart catheterization with coronary angiogram N/A 10/31/2013    Procedure: LEFT HEART CATHETERIZATION WITH CORONARY ANGIOGRAM;  Surgeon: Jettie Booze, MD;  Location: [facility] C[facility] LAB;  Service: Cardiovascular;  Laterality: N/A;  . Percutaneous coronary intervention-balloon only  10/31/2013    Procedure: PERCUTANEOUS CORONARY INTERVENTION-BALLOON ONLY;  Surgeon: Jettie Booze, MD;  Location: [facility] CATH LAB;  Service: Cardiovascular;;    Allergies  Allergen Reactions  . Cymbalta [Duloxetine Hcl] Swelling    Swelling in legs  . Gabapentin Swelling    Swelling in legs  . Adhesive [Tape] Rash    rash  . Cefazolin Hives    Hives   . Ciprofloxacin Other (See Comments)    Per pt, caused body aches   . Clorazepate Dipotassium Other (See Comments)  Unknown reaction  . Darifenacin Hydrobromide Other (See Comments)    hypotension, near syncope  . Dilaudid [Hydromorphone Hcl] Other (See Comments)     confused, intense itching  . Doxycycline Rash  . Enablex [Darifenacin Hydrobromide Er] Other (See Comments)    Hypotension, near syncope  . Levofloxacin Other (See Comments)    Causes wrist pain  . Lyrica [Pregabalin] Swelling    Swelling in legs  . Methadone Hcl Other (See Comments)    Reaction unknown  . Morphine And Related Other (See Comments)    Confusion   . Pentazocine Lactate Other (See Comments)    Altered mental state, "climbing walls", anxiety  . Talwin [Pentazocine] Other (See Comments)    "climbing walls" anxiety    Family history and social history have been reviewed.  Review of Systems:  Denies dysphagia, heartburn, positive for right lower quadrant abdominal pain and constipation  The remainder of the 10 point ROS is negative except as outlined in the H&P  Physical Exam: General Appearance Well developed, in no distress Psychological Normal mood and affect  Assessment and Plan:    80 year old white female with the history of multiple  adenomatous polyps dating back to 57. With one polyp in rectosigmoid   containing invasive adenocarcinoma . 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 She agrees  with  laparoscopically assisted right hemicolectomy. She is a very high risk for developing colon cancer.   History of gastroesophageal reflux and Nissen fundoplication    DJD of lumbosacral spine, under treatment      Delfin Edis 10/30/2014

## 2014-11-10 DIAGNOSIS — M5415 Radiculopathy, thoracolumbar region: Secondary | ICD-10-CM | POA: Diagnosis not present

## 2014-11-17 ENCOUNTER — Other Ambulatory Visit: Payer: Self-pay | Admitting: Internal Medicine

## 2014-11-17 ENCOUNTER — Other Ambulatory Visit: Payer: Self-pay | Admitting: Cardiology

## 2014-11-18 DIAGNOSIS — N3281 Overactive bladder: Secondary | ICD-10-CM | POA: Diagnosis not present

## 2014-11-18 DIAGNOSIS — R300 Dysuria: Secondary | ICD-10-CM | POA: Diagnosis not present

## 2014-11-19 ENCOUNTER — Telehealth: Payer: Self-pay | Admitting: Internal Medicine

## 2014-11-19 NOTE — Telephone Encounter (Signed)
Spoke with the patient, we agreed that she will see surgery first and see what is their opinion and she will keep me posted. Encouraged to call if questions.

## 2014-11-19 NOTE — Telephone Encounter (Signed)
Caller name:Ruth Relationship to patient:Self Can be reached:CBR in chart   Reason for call: PT has upcoming surgery to have part of colon removed and wanted to speak with Dr. Larose Kells regarding

## 2014-11-19 NOTE — Telephone Encounter (Signed)
FYI

## 2014-11-20 ENCOUNTER — Encounter: Payer: Self-pay | Admitting: Internal Medicine

## 2014-11-21 ENCOUNTER — Telehealth: Payer: Self-pay | Admitting: Internal Medicine

## 2014-11-21 DIAGNOSIS — I10 Essential (primary) hypertension: Secondary | ICD-10-CM

## 2014-11-21 NOTE — Telephone Encounter (Signed)
Advise patient, she is due for a BMP -- dx  hypertension, please arrange

## 2014-11-23 NOTE — Telephone Encounter (Signed)
BMP ordered

## 2014-11-23 NOTE — Telephone Encounter (Signed)
Letter printed and mailed to Pt, informing her she is due for BMP recheck.

## 2014-11-24 DIAGNOSIS — M4806 Spinal stenosis, lumbar region: Secondary | ICD-10-CM | POA: Diagnosis not present

## 2014-11-24 DIAGNOSIS — M5136 Other intervertebral disc degeneration, lumbar region: Secondary | ICD-10-CM | POA: Diagnosis not present

## 2014-11-24 DIAGNOSIS — M5135 Other intervertebral disc degeneration, thoracolumbar region: Secondary | ICD-10-CM | POA: Diagnosis not present

## 2014-11-24 DIAGNOSIS — M545 Low back pain: Secondary | ICD-10-CM | POA: Diagnosis not present

## 2014-11-24 IMAGING — CT CT ABD-PELV W/ CM
1 of 4 series · 13 of 32 positions shown, 18 images · IV contrast (OMNIPAQUE 300)
Comparison: 07/24/2008

CLINICAL DATA: Abdominal pain post colonoscopy today.

EXAM:
CT ABDOMEN AND PELVIS WITH CONTRAST
TECHNIQUE: Multidetector CT imaging of the abdomen and pelvis was performed
using the standard protocol following bolus administration of
intravenous contrast.
CONTRAST:  50mL OMNIPAQUE IOHEXOL 300 MG/ML SOLN, 100mL OMNIPAQUE
IOHEXOL 300 MG/ML SOLN

[Series 2: abd/pel with · axial · 0.73mm/px · z∈[+783,+1173]mm · 13 of 90 slices shown, 18 images]
[im 6/90  soft-tissue]
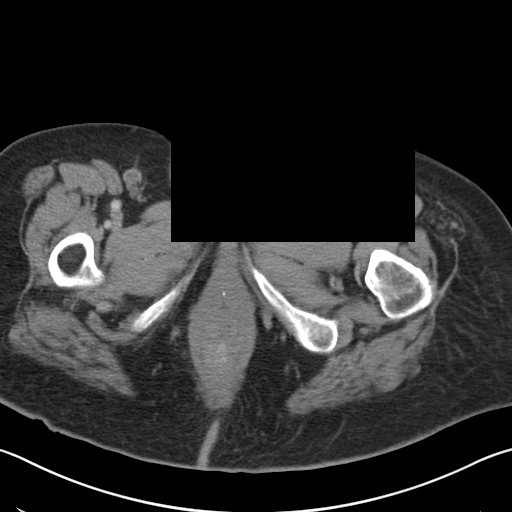
[im 6/90  bone]
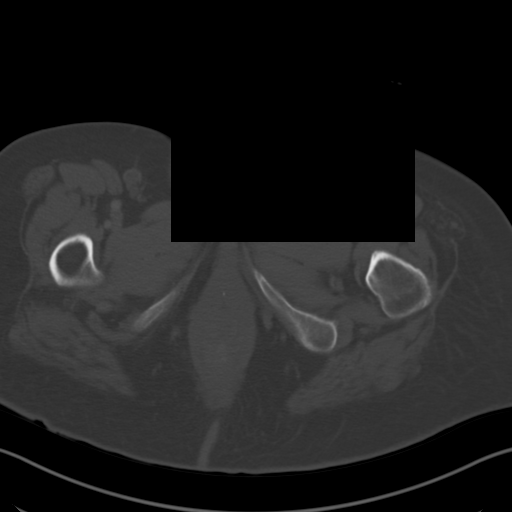
[im 12/90  soft-tissue]
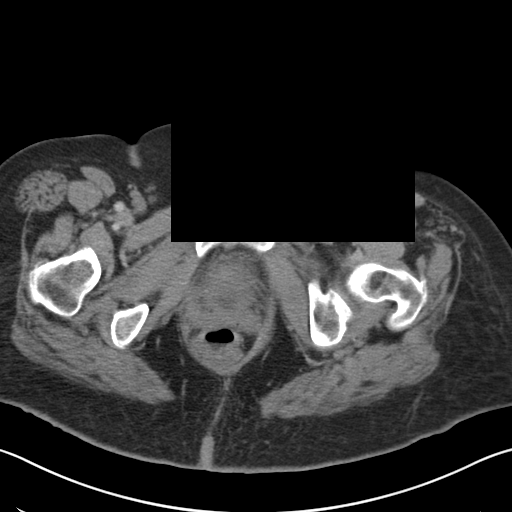
[im 23/90  soft-tissue]
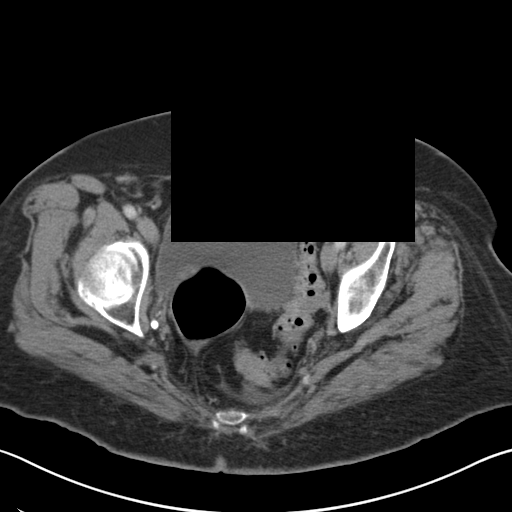
[im 28/90  soft-tissue]
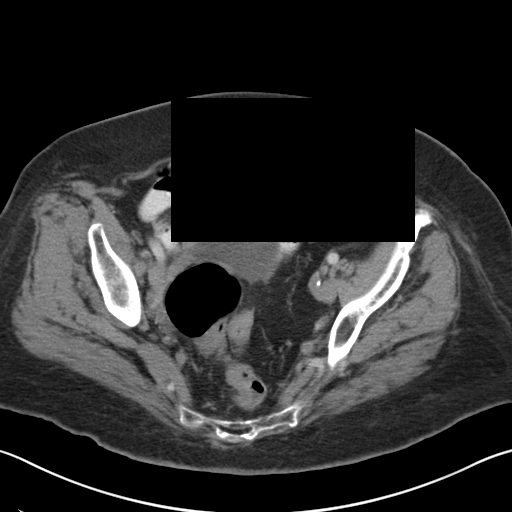
[im 34/90  soft-tissue]
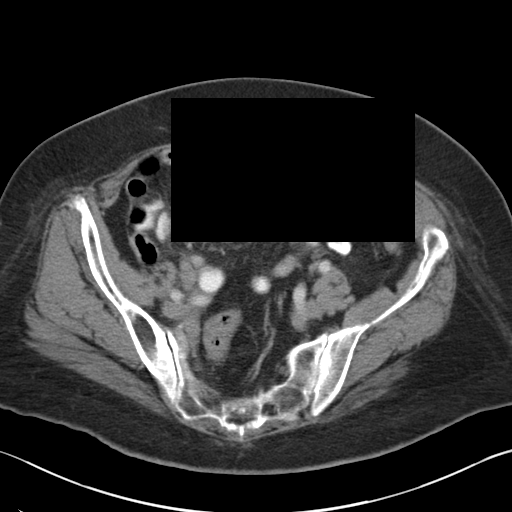
[im 39/90  soft-tissue]
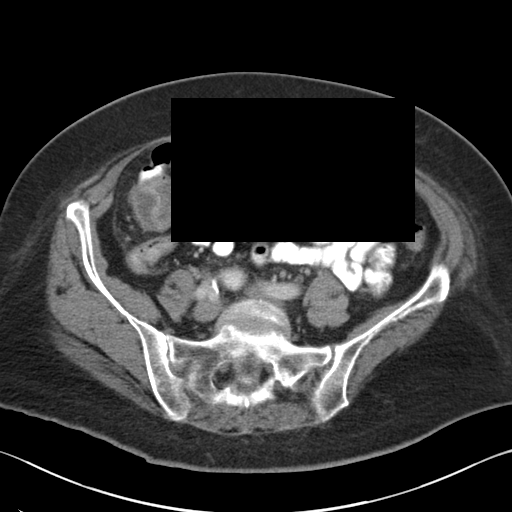
[im 51/90  soft-tissue]
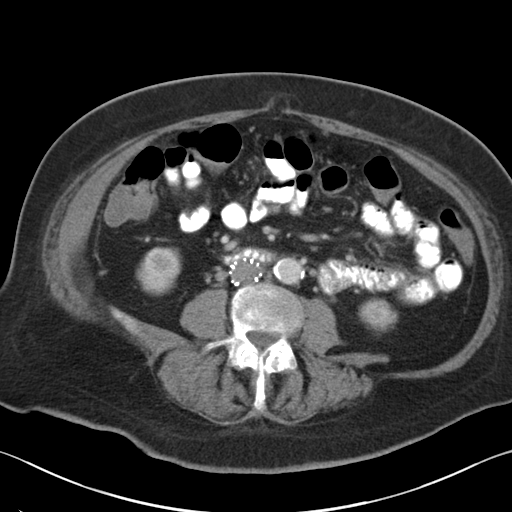
[im 56/90  soft-tissue]
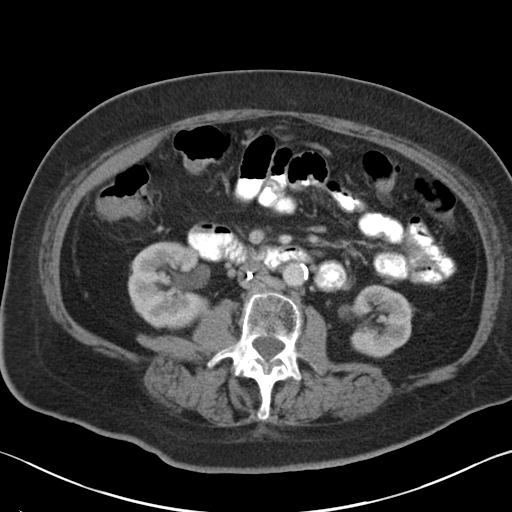
[im 62/90  soft-tissue]
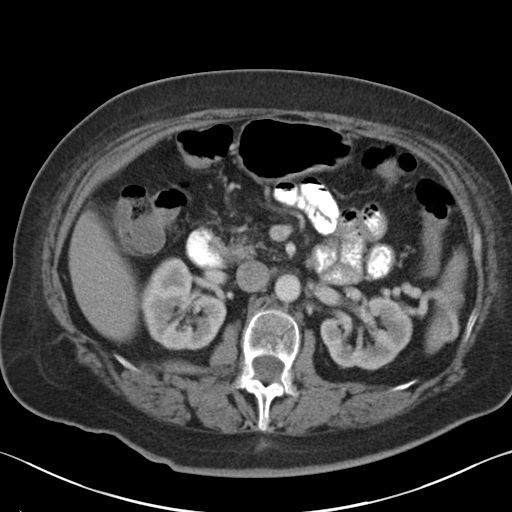
[im 62/90  bone]
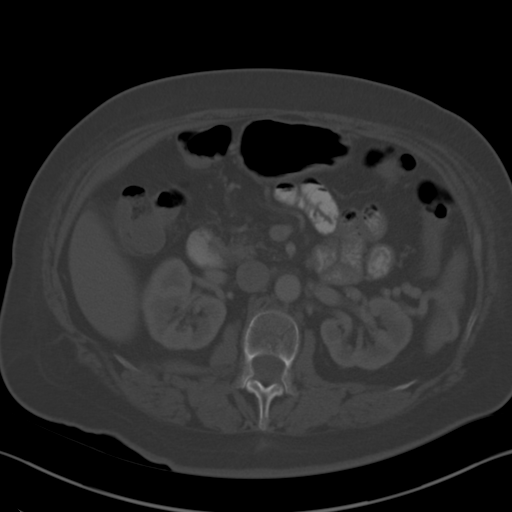
[im 67/90  soft-tissue]
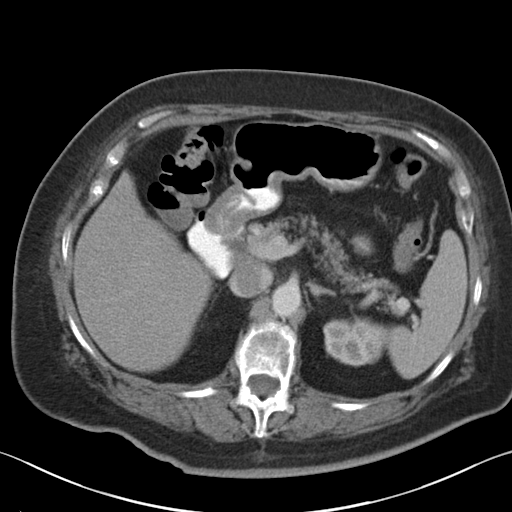
[im 67/90  lung]
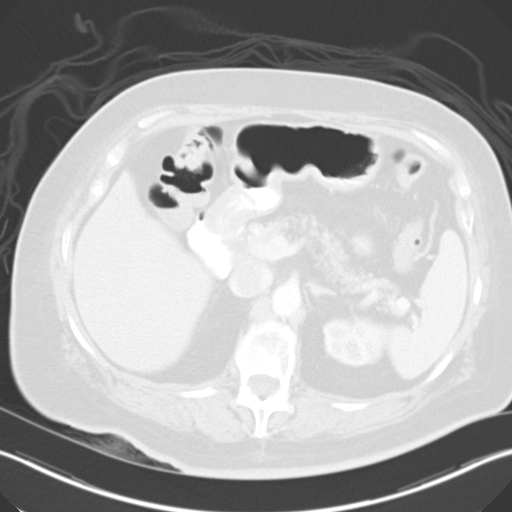
[im 73/90  lung]
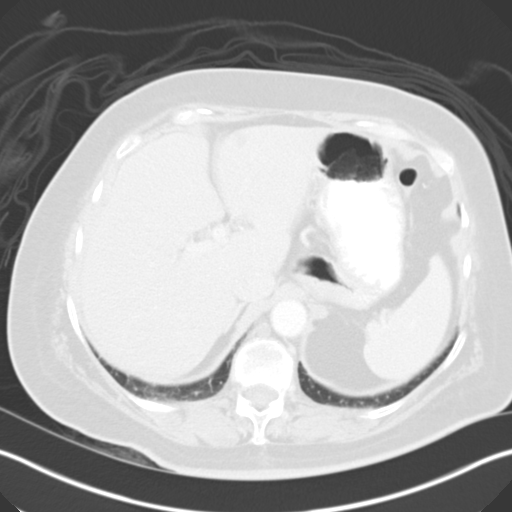
[im 78/90  soft-tissue]
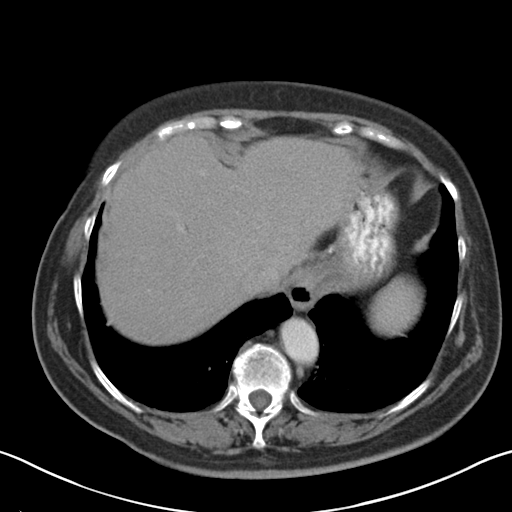
[im 78/90  lung]
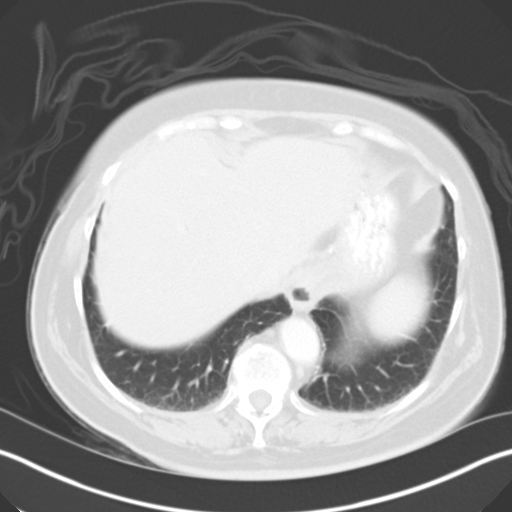
[im 84/90  soft-tissue]
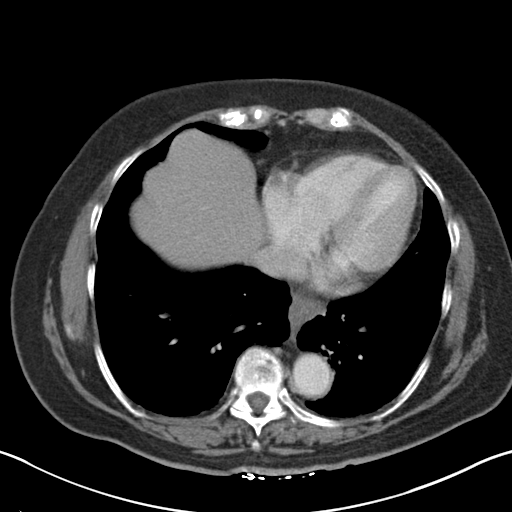
[im 84/90  lung]
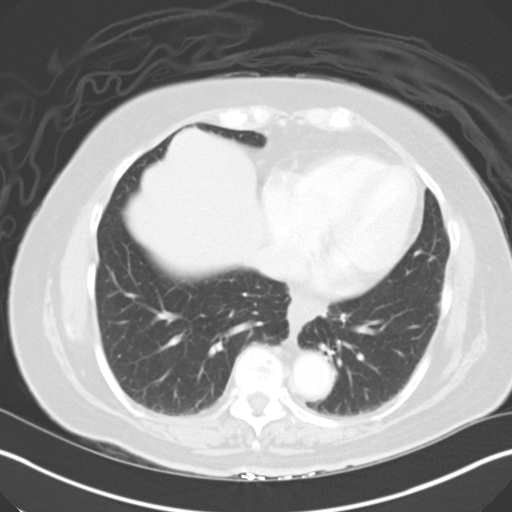

[13 of 32 positions shown; findings below may reference images not displayed]

FINDINGS: Lung bases are within normal. Findings likely representing prior
hiatal hernia repair.

Abdominal images demonstrate surgical absence of the gallbladder.
There is a 1.1 cm hypodensity over the lateral segment of the left
lobe of the liver unchanged likely a small cyst or hemangioma. The
spleen, pancreas and adrenal glands are within normal. There is a
1.1 cm simple cyst over the upper pole of the left kidney. Right
kidney is within normal. There is no hydronephrosis or
nephrolithiasis. Ureters are within normal. IVC filter is present
below the level of the renal veins. There is mild calcified plaque
over the abdominal aorta. There is mild diverticulosis of the colon.
Appendix is difficult to visualize but small likely within normal.
There are 2 linear metallic densities patient over the cecum with
somewhat mass like wall thickening of the cecal tip. These metallic
densities may be from recent surgical procedure.

There is subtle stranding of the mesenteric fat just posterior to
the cecal tip adjacent the terminal ileum as the terminal ileum is
otherwise within normal. There is no evidence of adenopathy. There
is a very small umbilical hernia containing only mesenteric fat.

Pelvic images demonstrate surgical absence of uterus. There is a
fleck of air over the nondependent portion of gallbladder which may
be due to recent instrumentation. There is a mild L1 compression
fracture unchanged from MRI lumbar spine 03/18/2013.
IMPRESSION: Masslike wall thickening involving the cecal tip with 2 associated
linear metallic densities suggesting prior surgical procedure in
this region. Subtle stranding of the adjacent mesenteric fat
although the adjacent terminal ileum and small caliber appendix are
unremarkable. This finding may be seen due to focal colitis from
infectious or inflammatory cause versus neoplasm. Recommend
correlation with patient's recent colonoscopy findings.

Stable 1.1 cm liver hypodensity likely a small hemangioma.

Mild diverticulosis of the colon.

1.1 cm left renal cyst.

Small fleck of air within the nondependent portion of the bladder
likely due to recent instrumentation, although can't be seen due to
infection or enteric fistula.

Stable L1 compression fracture.

## 2014-11-27 DIAGNOSIS — D369 Benign neoplasm, unspecified site: Secondary | ICD-10-CM | POA: Diagnosis not present

## 2014-12-04 ENCOUNTER — Telehealth: Payer: Self-pay | Admitting: Internal Medicine

## 2014-12-04 NOTE — Telephone Encounter (Signed)
Pt is requesting refill on Hydrocodone.   Last OV: 12/03/2013 Last Fill: 10/07/2015 #120 0RF UDS: 06/04/2014 Low risk  Please advise.

## 2014-12-04 NOTE — Telephone Encounter (Signed)
Caller name:  Lashonne Relation to pt: self Call back number: [phone] Pharmacy:  Reason for call:   Patient requesting a new hydrocodone rx.

## 2014-12-06 NOTE — Telephone Encounter (Signed)
Print two separate Rx for #120 each (to be use 11-2014 and 12-2014)

## 2014-12-07 MED ORDER — HYDROCODONE-ACETAMINOPHEN 10-325 MG PO TABS: 1.0000 | ORAL_TABLET | Freq: Four times a day (QID) | ORAL | Status: DC | PRN

## 2014-12-07 NOTE — Telephone Encounter (Signed)
Spoke with Pt, informed her that Rx is ready for pick up. Pt verbalized understanding.

## 2014-12-07 NOTE — Telephone Encounter (Signed)
Printed, awaiting Dr. Larose Kells signature.

## 2014-12-09 ENCOUNTER — Other Ambulatory Visit: Payer: Medicare Other

## 2014-12-11 ENCOUNTER — Other Ambulatory Visit (INDEPENDENT_AMBULATORY_CARE_PROVIDER_SITE_OTHER): Payer: Medicare Other

## 2014-12-11 DIAGNOSIS — I10 Essential (primary) hypertension: Secondary | ICD-10-CM

## 2014-12-11 LAB — BASIC METABOLIC PANEL WITH GFR
BUN: 23 mg/dL (ref 6–23)
CO2: 27 meq/L (ref 19–32)
Calcium: 9.4 mg/dL (ref 8.4–10.5)
Chloride: 106 meq/L (ref 96–112)
Creatinine, Ser: 1.06 mg/dL (ref 0.40–1.20)
GFR: 53.36 mL/min — ABNORMAL LOW
Glucose, Bld: 128 mg/dL — ABNORMAL HIGH (ref 70–99)
Potassium: 4.3 meq/L (ref 3.5–5.1)
Sodium: 138 meq/L (ref 135–145)

## 2014-12-29 NOTE — Progress Notes (Signed)
Please put orders in Epic surgery 01-08-15 pre op 12-31-14 Thanks

## 2014-12-30 NOTE — Patient Instructions (Addendum)
[name]  12/30/2014   Your procedure is scheduled on: Friday 01/08/2015  Report to [facility]  Entrance and follow signs to               [place] at  [phone].  Call this number if you have problems the morning of surgery [phone]   Remember: [place] DR[facility]S OFFICE THE DAY BEFORE SURGERY AND DRINK CLEAR LIQUIDS ALL DAY ON Thursday 01/07/2015!   CLEAR LIQUID DIET   Foods Allowed                                                                     Foods Excluded  Coffee and tea, regular and decaf                             liquids that you cannot  Plain Jell-O in any flavor                                             see through such as: Fruit ices (not with fruit pulp)                                     milk, soups, orange juice  Iced Popsicles                                    All solid food Carbonated beverages, regular and diet                                    Cranberry, grape and apple juices Sports drinks like Gatorade Lightly seasoned clear broth or consume(fat free) Sugar, honey syrup  Sample Menu Breakfast                                Lunch                                     Supper Cranberry juice                    Beef broth                            Chicken broth Jell-O                                     Grape juice  Apple juice Coffee or tea                        Jell-O                                      Popsicle                                                Coffee or tea                        Coffee or tea  _____________________________________________________________________    Do not eat food or drink liquids :After Midnight.     Take these medicines the morning of surgery with A SIP OF WATER: none                               You may not have any metal on your body including hair pins and              piercings  Do not wear jewelry, make-up,  lotions, powders or perfumes.             Do not wear nail polish.  Do not shave  48 hours prior to surgery.              Men may shave face and neck.   Do not bring valuables to the hospital. [place].  Contacts, dentures or bridgework may not be worn into surgery.  Leave suitcase in the car. After surgery it may be brought to your room.     Patients discharged the day of surgery will not be allowed to drive home.  Name and phone number of your driver:  Special Instructions: N/A              Please read over the following fact sheets you were given: _____________________________________________________________________             [facility] - Preparing for Surgery Before surgery, you can play an important role.  Because skin is not sterile, your skin needs to be as free of germs as possible.  You can reduce the number of germs on your skin by washing with CHG (chlorahexidine gluconate) soap before surgery.  CHG is an antiseptic cleaner which kills germs and bonds with the skin to continue killing germs even after washing. Please DO NOT use if you have an allergy to CHG or antibacterial soaps.  If your skin becomes reddened/irritated stop using the CHG and inform your nurse when you arrive at Short Stay. Do not shave (including legs and underarms) for at least 48 hours prior to the first CHG shower.  You may shave your face/neck. Please follow these instructions carefully:  1.  Shower with CHG Soap the night before surgery and the  morning of Surgery.  2.  If you choose to wash your hair, wash your hair first as usual with your  normal  shampoo.  3.  After you shampoo, rinse your hair and body thoroughly to remove the  shampoo.  4.  Use CHG as you would any other liquid soap.  You can apply chg directly  to the skin and wash                       Gently with a scrungie or clean washcloth.  5.  Apply the CHG Soap  to your body ONLY FROM THE NECK DOWN.   Do not use on face/ open                           Wound or open sores. Avoid contact with eyes, ears mouth and genitals (private parts).                       Wash face,  Genitals (private parts) with your normal soap.             6.  Wash thoroughly, paying special attention to the area where your surgery  will be performed.  7.  Thoroughly rinse your body with warm water from the neck down.  8.  DO NOT shower/wash with your normal soap after using and rinsing off  the CHG Soap.                9.  Pat yourself dry with a clean towel.            10.  Wear clean pajamas.            11.  Place clean sheets on your bed the night of your first shower and do not  sleep with pets. Day of Surgery : Do not apply any lotions/deodorants the morning of surgery.  Please wear clean clothes to the hospital/surgery center.  FAILURE TO FOLLOW THESE INSTRUCTIONS MAY RESULT IN THE CANCELLATION OF YOUR SURGERY PATIENT SIGNATURE_________________________________  NURSE SIGNATURE__________________________________  ________________________________________________________________________   [name]  An incentive spirometer is a tool that can help keep your lungs clear and active. This tool measures how well you are filling your lungs with each breath. Taking long deep breaths may help reverse or decrease the chance of developing breathing (pulmonary) problems (especially infection) following:  A long period of time when you are unable to move or be active. BEFORE THE PROCEDURE   If the spirometer includes an indicator to show your best effort, your nurse or respiratory therapist will set it to a desired goal.  If possible, sit up straight or lean slightly forward. Try not to slouch.  Hold the incentive spirometer in an upright position. INSTRUCTIONS FOR USE  1. Sit on the edge of your bed if possible, or sit up as far as you can in bed or on a  chair. 2. Hold the incentive spirometer in an upright position. 3. Breathe out normally. 4. Place the mouthpiece in your mouth and seal your lips tightly around it. 5. Breathe in slowly and as deeply as possible, raising the piston or the ball toward the top of the column. 6. Hold your breath for 3-5 seconds or for as long as possible. Allow the piston or ball to fall to the bottom of the column. 7. Remove the mouthpiece from your mouth and breathe out normally. 8. Rest for a few seconds and repeat Steps 1 through 7 at least 10 times every 1-2 hours when you are awake. Take your time and take a few normal breaths between deep breaths. 9. The spirometer may include an indicator to  show your best effort. Use the indicator as a goal to work toward during each repetition. 10. After each set of 10 deep breaths, practice coughing to be sure your lungs are clear. If you have an incision (the cut made at the time of surgery), support your incision when coughing by placing a pillow or rolled up towels firmly against it. Once you are able to get out of bed, walk around indoors and cough well. You may stop using the incentive spirometer when instructed by your caregiver.  RISKS AND COMPLICATIONS  Take your time so you do not get dizzy or light-headed.  If you are in pain, you may need to take or ask for pain medication before doing incentive spirometry. It is harder to take a deep breath if you are having pain. AFTER USE  Rest and breathe slowly and easily.  It can be helpful to keep track of a log of your progress. Your caregiver can provide you with a simple table to help with this. If you are using the spirometer at home, follow these instructions: [place] IF:   You are having difficultly using the spirometer.  You have trouble using the spirometer as often as instructed.  Your pain medication is not giving enough relief while using the spirometer.  You develop fever of 100.5 F  (38.1 C) or higher. SEEK IMMEDIATE MEDICAL CARE IF:   You cough up bloody sputum that had not been present before.  You develop fever of 102 F (38.9 C) or greater.  You develop worsening pain at or near the incision site. MAKE SURE YOU:   Understand these instructions.  Will watch your condition.  Will get help right away if you are not doing well or get worse. Document Released: 02/19/2007 Document Revised: 01/01/2012 Document Reviewed: 04/22/2007 [facility] Patient Information 2014 [place], [place].   ________________________________________________________________________

## 2014-12-31 ENCOUNTER — Encounter (HOSPITAL_COMMUNITY)
Admission: RE | Admit: 2014-12-31 | Discharge: 2014-12-31 | Disposition: A | Discharge status: Home / Self Care | Payer: Medicare Other | Source: Ambulatory Visit | Attending: Surgery | Admitting: Surgery

## 2014-12-31 ENCOUNTER — Encounter (HOSPITAL_COMMUNITY): Payer: Self-pay

## 2014-12-31 DIAGNOSIS — K635 Polyp of colon: Secondary | ICD-10-CM | POA: Diagnosis not present

## 2014-12-31 DIAGNOSIS — Z01812 Encounter for preprocedural laboratory examination: Secondary | ICD-10-CM | POA: Diagnosis not present

## 2014-12-31 LAB — CBC
HCT: 39.6 % (ref 36.0–46.0)
Hemoglobin: 12.7 g/dL (ref 12.0–15.0)
MCH: 29.1 pg (ref 26.0–34.0)
MCHC: 32.1 g/dL (ref 30.0–36.0)
MCV: 90.6 fL (ref 78.0–100.0)
Platelets: 181 10*3/uL (ref 150–400)
RBC: 4.37 MIL/uL (ref 3.87–5.11)
RDW: 13.4 % (ref 11.5–15.5)
WBC: 5.8 10*3/uL (ref 4.0–10.5)

## 2014-12-31 LAB — BASIC METABOLIC PANEL
Anion gap: 5 (ref 5–15)
BUN: 20 mg/dL (ref 6–23)
CO2: 26 mmol/L (ref 19–32)
Calcium: 9.1 mg/dL (ref 8.4–10.5)
Chloride: 109 mmol/L (ref 96–112)
Creatinine, Ser: 0.95 mg/dL (ref 0.50–1.10)
GFR calc Af Amer: 65 mL/min — ABNORMAL LOW (ref >90)
GFR calc non Af Amer: 56 mL/min — ABNORMAL LOW (ref >90)
Glucose, Bld: 140 mg/dL — ABNORMAL HIGH (ref 70–99)
Potassium: 4.5 mmol/L (ref 3.5–5.1)
Sodium: 140 mmol/L (ref 135–145)

## 2014-12-31 LAB — ABO/RH: ABO/RH(D): O POS

## 2014-12-31 NOTE — Progress Notes (Signed)
   12/31/14 0910  OBSTRUCTIVE SLEEP APNEA  Have you ever been diagnosed with sleep apnea through a sleep study? No  Do you snore loudly (loud enough to be heard through closed doors)?  0  Do you often feel tired, fatigued, or sleepy during the daytime? 1  Has anyone observed you stop breathing during your sleep? 0  Do you have, or are you being treated for high blood pressure? 1  BMI more than 35 kg/m2? 1  Age over 78 years old? 1  Neck circumference greater than 40 cm/16 inches? 0  Gender: 0  Obstructive Sleep Apnea Score 4

## 2015-01-04 NOTE — Progress Notes (Signed)
Please put orders in EPIC as patient has surgery Friday 01/08/2015! Patient had pre-op appointment on Thursday 12/31/2014 and labs, EKG, Chest x-ray were done per Anesthesia! Thank you!

## 2015-01-07 ENCOUNTER — Ambulatory Visit: Payer: Self-pay | Admitting: Surgery

## 2015-01-07 NOTE — H&P (Signed)
[name] 11/27/2014 3:42 PM Location: [facility] Patient #: [id number] DOB: April 20, 1935 Divorced / Language: Cleophus Molt / Race: White Female  History of Present Illness Rodman Key B. Hassell Done MD; 11/27/2014 4:49 PM) Patient words: For right hemicolectomy  Per Dr. 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 She is here to discuss possible right hemicolectomy. She has currently a cecal polyp and a descending colon polyp which we attempted to remove on colonoscopy in April 2015 adenoma but unfortunately patient had a post-polypectomy burn and had to be hospitalized .She did not require surgery. The polyp was carpeted and deep and was not removed entirely. The cecal polyp was villoadenomatous and the ascending colon polyp was adenomatous. It tattooed both polyps and also placed endoclips She since then has had right lower quadrant abdominal pain, but CT scans of the abdomen fail to show any inflammatory changes in the right colon.. I recommended the right hemicolectomy to avoid future risk of colon cancer aortic stenosis mentioned above.She agrees with the plan. She had numerous colonoscopies starting in 1985. 1994 when she had one and half centimeter polyp at 15 cm with invasive adenocarcinoma. As well as ascending colon polyp which was adenomatous in March 1994 flexible sigmoidoscopy showed complete resolution of the sigmoid polyp. In September 1997 colonoscopy showed cecal polyp which was hyperplastic. In March 2002 colonoscopy again showed cecal polyp which was tubovillous adenoma. In May 2007 she descending colon polyp which was adenomatous. In August 2010 colonoscopy showed cecal and ascending colon tubular adenomas. In April 2015 she had a tubulovillous adenoma of the cecum and tubular adenoma of the ascending colon.as mentioned above. She has a history of gastroesophageal  reflux and [employer] fundoplication by Dr. Hassell Done. She has been satisfied with the results. She has low back pain for which she has been undergoing injections. and has been ambulating with walker due to RSD.  I discussed right hemicolectomy with her in some detail. I would do this laparoscopically-assisted fashion. We did discuss the risk of colostomy although I think that's low. Will proceed with scheduling the right hemicolectomy.  The patient is a 80 year old female    Other Problems Marjean Donna, CMA; 11/27/2014 3:43 PM) Arthritis Back Pain Cholelithiasis Diabetes Mellitus Diverticulosis Gastric Ulcer Gastroesophageal Reflux Disease Hemorrhoids High blood pressure Kidney Stone Migraine Headache Pulmonary Embolism / Blood Clot in Legs Vascular Disease  Past Surgical History Marjean Donna, CMA; 11/27/2014 3:43 PM) Cataract Surgery Bilateral. Colon Polyp Removal - Colonoscopy Foot Surgery Bilateral. Nissen Fundoplication Oral Surgery Shoulder Surgery Bilateral. Spinal Surgery - Neck  Diagnostic Studies History Marjean Donna, CMA; 11/27/2014 3:43 PM) Colonoscopy within last year Mammogram within last year Pap Smear 1-5 years ago  Allergies Marjean Donna, CMA; 11/27/2014 3:49 PM) Cymbalta *ANTIDEPRESSANTS* Gabapentin *ANTICONVULSANTS* Adhesive Tape 1"x5yd *MEDICAL DEVICES AND SUPPLIES* CeFAZolin in D5W *CEPHALOSPORINS* Ciprofloxacin *Fluoroquinolones** Clorazepate Dipotassium *ANTIANXIETY AGENTS* Doxycycline *DERMATOLOGICALS* Dilaudid *ANALGESICS - OPIOID* Enablex *URINARY ANTISPASMODICS* Levofloxacin *CHEMICALS* Lyrica *ANTICONVULSANTS* Methadone HCl *ANALGESICS - OPIOID* Morphine Sulfate (Concentrate) *ANALGESICS - OPIOID* Pentazocine-Acetaminophen *ANALGESICS - OPIOID*  Medication History (Sonya Bynum, CMA; 11/27/2014 3:45 PM) Amlodipine Besy-Benazepril HCl (5-10MG  Capsule, Oral) Active. Clobetasol Propionate (0.05% Cream, External)  Active. Clopidogrel Bisulfate (75MG  Tablet, Oral) Active. Nitrostat (0.4MG  Tab Sublingual, Sublingual as needed) Active. Nabumetone (750MG  Tablet, Oral) Active. Nortriptyline HCl (10MG  Capsule, Oral) Active. Omeprazole (20MG  Capsule DR, Oral) Active. Ranitidine HCl (150MG  Tablet, Oral) Active. TiZANidine HCl (4MG   Tablet, Oral) Active. FreeStyle Lancets Active. FreeStyle Lite Test (In Vitro) Active. Clindamycin HCl (300MG  Capsule, Oral) Active. Trimethoprim (100MG  Tablet, Oral) Active.  Social History ([place]; 11/27/2014 3:43 PM) Caffeine use Coffee. No alcohol use No drug use Tobacco use Never smoker.  Family History Marjean Donna, [place]; 11/27/2014 3:43 PM) Arthritis Mother. Colon Polyps Daughter. Diabetes Mellitus Father. Hypertension Mother, Son.  Pregnancy / Birth History Marjean Donna, [place]; 11/27/2014 3:43 PM) Age at menarche 49 years. Age of menopause <47 Contraceptive History Intrauterine device, Oral contraceptives. Gravida 3 Maternal age 54-25 Para 3 Regular periods  Review of Systems Davy Pique Bynum CMA; 11/27/2014 3:43 PM) General Not Present- Appetite Loss, Chills, Fatigue, Fever, Night Sweats, Weight Gain and Weight Loss. Skin Present- Change in Wart/Mole and Rash. Not Present- Dryness, Hives, Jaundice, New Lesions, Non-Healing Wounds and Ulcer. HEENT Present- Hearing Loss, Oral Ulcers, Seasonal Allergies and Wears glasses/contact lenses. Not Present- Earache, Hoarseness, Nose Bleed, Ringing in the Ears, Sinus Pain, Sore Throat, Visual Disturbances and Yellow Eyes. Respiratory Not Present- Bloody sputum, Chronic Cough, Difficulty Breathing, Snoring and Wheezing. Gastrointestinal Present- Abdominal Pain, Constipation, Excessive gas, Gets full quickly at meals, Hemorrhoids and Indigestion. Not Present- Bloating, Bloody Stool, Change in Bowel Habits, Chronic diarrhea, Difficulty Swallowing, Nausea, Rectal Pain and Vomiting. Musculoskeletal  Present- Back Pain, Joint Pain, Joint Stiffness and Muscle Weakness. Not Present- Muscle Pain and Swelling of Extremities. Neurological Present- Numbness, Trouble walking and Weakness. Not Present- Decreased Memory, Fainting, Headaches, Seizures, Tingling and Tremor. Endocrine Present- Cold Intolerance. Not Present- Excessive Hunger, Hair Changes, Heat Intolerance, Hot flashes and New Diabetes. Hematology Not Present- Easy Bruising, Excessive bleeding, Gland problems, HIV and Persistent Infections.   Vitals (Sonya Bynum CMA; 11/27/2014 3:44 PM) 11/27/2014 3:43 PM Weight: 194 lb Height: 64in Body Surface Area: 1.99 m Body Mass Index: 33.3 kg/m Temp.: 97.29F(Temporal)  Pulse: 79 (Regular)  BP: 124/76 (Sitting, Left Arm, Standard)    Physical Exam (Kaelum [place] B. Hassell Done MD; 11/27/2014 4:53 PM) The physical exam findings are as follows: Note:Normally developed white female in no distress. Head: normocephalic Eyes; nonicteric PERRR Neck: no bruits Chest: clear Heart: SR without murmurs Abdomen: nontender Ext: wearing a brace on her left leg and neuropathic changes on either side.    Assessment & Plan Rodman Key B. Hassell Done MD; 11/27/2014 4:56 PM) ADENOMATOUS POLYPS (229.9  D36.9) Impression: schedule for right hemicolectomy Current Plans  Started Neomycin Sulfate 500MG , 2 (two) Tablet SEE NOTE, #6, 11/27/2014, No Refill. Local Order: TAKE TWO TABLETS AT 2 PM, 3 PM, AND 10 PM THE DAY PRIOR TO SURGERY

## 2015-01-08 ENCOUNTER — Inpatient Hospital Stay (HOSPITAL_COMMUNITY)
Admission: RE | Admit: 2015-01-08 | Discharge: 2015-01-13 | DRG: 330 | Disposition: A | Discharge status: Home / Self Care | Payer: Medicare Other | Source: Ambulatory Visit | Attending: Surgery | Admitting: Surgery

## 2015-01-08 ENCOUNTER — Encounter (HOSPITAL_COMMUNITY)
Admission: RE | Disposition: A | Discharge status: Home / Self Care | Payer: Self-pay | Source: Ambulatory Visit | Attending: Surgery

## 2015-01-08 ENCOUNTER — Inpatient Hospital Stay (HOSPITAL_COMMUNITY): Payer: Medicare Other | Admitting: Registered Nurse

## 2015-01-08 ENCOUNTER — Encounter (HOSPITAL_COMMUNITY): Payer: Self-pay

## 2015-01-08 DIAGNOSIS — D126 Benign neoplasm of colon, unspecified: Secondary | ICD-10-CM | POA: Diagnosis not present

## 2015-01-08 DIAGNOSIS — Z9049 Acquired absence of other specified parts of digestive tract: Secondary | ICD-10-CM | POA: Diagnosis present

## 2015-01-08 DIAGNOSIS — Z833 Family history of diabetes mellitus: Secondary | ICD-10-CM | POA: Diagnosis not present

## 2015-01-08 DIAGNOSIS — J309 Allergic rhinitis, unspecified: Secondary | ICD-10-CM | POA: Diagnosis present

## 2015-01-08 DIAGNOSIS — D122 Benign neoplasm of ascending colon: Secondary | ICD-10-CM | POA: Diagnosis present

## 2015-01-08 DIAGNOSIS — Z885 Allergy status to narcotic agent status: Secondary | ICD-10-CM

## 2015-01-08 DIAGNOSIS — G43909 Migraine, unspecified, not intractable, without status migrainosus: Secondary | ICD-10-CM | POA: Diagnosis present

## 2015-01-08 DIAGNOSIS — D649 Anemia, unspecified: Secondary | ICD-10-CM | POA: Diagnosis present

## 2015-01-08 DIAGNOSIS — I10 Essential (primary) hypertension: Secondary | ICD-10-CM | POA: Diagnosis present

## 2015-01-08 DIAGNOSIS — Z91048 Other nonmedicinal substance allergy status: Secondary | ICD-10-CM | POA: Diagnosis not present

## 2015-01-08 DIAGNOSIS — Z86711 Personal history of pulmonary embolism: Secondary | ICD-10-CM

## 2015-01-08 DIAGNOSIS — R110 Nausea: Secondary | ICD-10-CM | POA: Diagnosis not present

## 2015-01-08 DIAGNOSIS — Z881 Allergy status to other antibiotic agents status: Secondary | ICD-10-CM

## 2015-01-08 DIAGNOSIS — K219 Gastro-esophageal reflux disease without esophagitis: Secondary | ICD-10-CM | POA: Diagnosis not present

## 2015-01-08 DIAGNOSIS — E785 Hyperlipidemia, unspecified: Secondary | ICD-10-CM | POA: Diagnosis present

## 2015-01-08 DIAGNOSIS — R682 Dry mouth, unspecified: Secondary | ICD-10-CM | POA: Diagnosis not present

## 2015-01-08 DIAGNOSIS — M810 Age-related osteoporosis without current pathological fracture: Secondary | ICD-10-CM | POA: Diagnosis present

## 2015-01-08 DIAGNOSIS — I2510 Atherosclerotic heart disease of native coronary artery without angina pectoris: Secondary | ICD-10-CM | POA: Diagnosis present

## 2015-01-08 DIAGNOSIS — Z9989 Dependence on other enabling machines and devices: Secondary | ICD-10-CM | POA: Diagnosis not present

## 2015-01-08 DIAGNOSIS — M1990 Unspecified osteoarthritis, unspecified site: Secondary | ICD-10-CM | POA: Diagnosis present

## 2015-01-08 DIAGNOSIS — Z86718 Personal history of other venous thrombosis and embolism: Secondary | ICD-10-CM | POA: Diagnosis not present

## 2015-01-08 DIAGNOSIS — Z87440 Personal history of urinary (tract) infections: Secondary | ICD-10-CM | POA: Diagnosis not present

## 2015-01-08 DIAGNOSIS — G90529 Complex regional pain syndrome I of unspecified lower limb: Secondary | ICD-10-CM | POA: Diagnosis present

## 2015-01-08 DIAGNOSIS — E119 Type 2 diabetes mellitus without complications: Secondary | ICD-10-CM | POA: Diagnosis present

## 2015-01-08 DIAGNOSIS — Z888 Allergy status to other drugs, medicaments and biological substances status: Secondary | ICD-10-CM | POA: Diagnosis not present

## 2015-01-08 DIAGNOSIS — F329 Major depressive disorder, single episode, unspecified: Secondary | ICD-10-CM | POA: Diagnosis present

## 2015-01-08 DIAGNOSIS — Z79899 Other long term (current) drug therapy: Secondary | ICD-10-CM | POA: Diagnosis not present

## 2015-01-08 HISTORY — PX: LAPAROSCOPIC RIGHT HEMI COLECTOMY: SHX5926

## 2015-01-08 LAB — GLUCOSE, CAPILLARY
Glucose-Capillary: 125 mg/dL — ABNORMAL HIGH (ref 70–99)
Glucose-Capillary: 164 mg/dL — ABNORMAL HIGH (ref 70–99)

## 2015-01-08 LAB — TYPE AND SCREEN
ABO/RH(D): O POS
Antibody Screen: NEGATIVE

## 2015-01-08 LAB — CBC
HCT: 40.6 % (ref 36.0–46.0)
Hemoglobin: 13.1 g/dL (ref 12.0–15.0)
MCH: 28.9 pg (ref 26.0–34.0)
MCHC: 32.3 g/dL (ref 30.0–36.0)
MCV: 89.4 fL (ref 78.0–100.0)
Platelets: 191 10*3/uL (ref 150–400)
RBC: 4.54 MIL/uL (ref 3.87–5.11)
RDW: 13.5 % (ref 11.5–15.5)
WBC: 10.6 10*3/uL — ABNORMAL HIGH (ref 4.0–10.5)

## 2015-01-08 LAB — CREATININE, SERUM
Creatinine, Ser: 0.73 mg/dL (ref 0.50–1.10)
GFR calc Af Amer: >90 mL/min (ref >90)
GFR calc non Af Amer: 80 mL/min — ABNORMAL LOW (ref >90)

## 2015-01-08 SURGERY — LAPAROSCOPIC RIGHT HEMI COLECTOMY
Anesthesia: General | Site: Abdomen

## 2015-01-08 MED ORDER — LIDOCAINE HCL (CARDIAC) 20 MG/ML IV SOLN
INTRAVENOUS | Status: AC
  Filled 2015-01-08: qty 5

## 2015-01-08 MED ORDER — SUFENTANIL CITRATE 50 MCG/ML IV SOLN
INTRAVENOUS | Status: DC | PRN
  Administered 2015-01-08: 5 ug via INTRAVENOUS
  Administered 2015-01-08: 15 ug via INTRAVENOUS
  Administered 2015-01-08: 10 ug via INTRAVENOUS
  Administered 2015-01-08: 5 ug via INTRAVENOUS
  Administered 2015-01-08 (×2): 10 ug via INTRAVENOUS
  Administered 2015-01-08: 5 ug via INTRAVENOUS
  Administered 2015-01-08: 10 ug via INTRAVENOUS
  Administered 2015-01-08 (×2): 5 ug via INTRAVENOUS

## 2015-01-08 MED ORDER — PROMETHAZINE HCL 25 MG/ML IJ SOLN
6.2500 mg | INTRAMUSCULAR | Status: DC | PRN
  Administered 2015-01-08: 6.25 mg via INTRAVENOUS

## 2015-01-08 MED ORDER — ERYTHROMYCIN BASE 250 MG PO TABS: 1000.0000 mg | ORAL_TABLET | ORAL | Status: DC

## 2015-01-08 MED ORDER — CHLORHEXIDINE GLUCONATE CLOTH 2 % EX PADS: 6.0000 | MEDICATED_PAD | Freq: Once | CUTANEOUS | Status: DC

## 2015-01-08 MED ORDER — SCOPOLAMINE 1 MG/3DAYS TD PT72
MEDICATED_PATCH | TRANSDERMAL | Status: AC
  Filled 2015-01-08: qty 1

## 2015-01-08 MED ORDER — ONDANSETRON HCL 4 MG/2ML IJ SOLN
INTRAMUSCULAR | Status: DC | PRN
  Administered 2015-01-08: 4 mg via INTRAVENOUS

## 2015-01-08 MED ORDER — FENTANYL CITRATE 0.05 MG/ML IJ SOLN
12.5000 ug | INTRAMUSCULAR | Status: DC | PRN
  Administered 2015-01-08 – 2015-01-09 (×7): 12.5 ug via INTRAVENOUS
  Filled 2015-01-08 (×7): qty 2

## 2015-01-08 MED ORDER — GLYCOPYRROLATE 0.2 MG/ML IJ SOLN
INTRAMUSCULAR | Status: AC
  Filled 2015-01-08: qty 3

## 2015-01-08 MED ORDER — 0.9 % SODIUM CHLORIDE (POUR BTL) OPTIME
TOPICAL | Status: DC | PRN
  Administered 2015-01-08: 2000 mL

## 2015-01-08 MED ORDER — CETYLPYRIDINIUM CHLORIDE 0.05 % MT LIQD
7.0000 mL | Freq: Two times a day (BID) | OROMUCOSAL | Status: DC
  Administered 2015-01-08 – 2015-01-13 (×9): 7 mL via OROMUCOSAL

## 2015-01-08 MED ORDER — PIPERACILLIN-TAZOBACTAM 3.375 G IVPB
INTRAVENOUS | Status: AC
  Filled 2015-01-08: qty 50

## 2015-01-08 MED ORDER — SODIUM CHLORIDE 0.9 % IJ SOLN
INTRAMUSCULAR | Status: AC
  Filled 2015-01-08: qty 10

## 2015-01-08 MED ORDER — NITROGLYCERIN 0.4 MG SL SUBL: 0.4000 mg | SUBLINGUAL_TABLET | SUBLINGUAL | Status: DC | PRN

## 2015-01-08 MED ORDER — FENTANYL CITRATE 0.05 MG/ML IJ SOLN
INTRAMUSCULAR | Status: AC
  Filled 2015-01-08: qty 5

## 2015-01-08 MED ORDER — DEXAMETHASONE SODIUM PHOSPHATE 10 MG/ML IJ SOLN
INTRAMUSCULAR | Status: DC | PRN
  Administered 2015-01-08: 10 mg via INTRAVENOUS

## 2015-01-08 MED ORDER — CEFOTETAN DISODIUM 2 G IJ SOLR: 2.0000 g | INTRAMUSCULAR | Status: DC

## 2015-01-08 MED ORDER — PHENYLEPHRINE 40 MCG/ML (10ML) SYRINGE FOR IV PUSH (FOR BLOOD PRESSURE SUPPORT)
PREFILLED_SYRINGE | INTRAVENOUS | Status: AC
  Filled 2015-01-08: qty 10

## 2015-01-08 MED ORDER — MIDAZOLAM HCL 2 MG/2ML IJ SOLN
INTRAMUSCULAR | Status: AC
  Filled 2015-01-08: qty 2

## 2015-01-08 MED ORDER — HEPARIN SODIUM (PORCINE) 5000 UNIT/ML IJ SOLN
5000.0000 [IU] | Freq: Once | INTRAMUSCULAR | Status: AC
  Administered 2015-01-08: 5000 [IU] via SUBCUTANEOUS
  Filled 2015-01-08: qty 1

## 2015-01-08 MED ORDER — DIPHENHYDRAMINE HCL 50 MG/ML IJ SOLN
12.5000 mg | Freq: Four times a day (QID) | INTRAMUSCULAR | Status: DC | PRN
  Administered 2015-01-12: 12.5 mg via INTRAVENOUS
  Filled 2015-01-08: qty 1

## 2015-01-08 MED ORDER — GLYCOPYRROLATE 0.2 MG/ML IJ SOLN
INTRAMUSCULAR | Status: DC | PRN
  Administered 2015-01-08: 0.4 mg via INTRAVENOUS

## 2015-01-08 MED ORDER — NEOMYCIN SULFATE 500 MG PO TABS: 1000.0000 mg | ORAL_TABLET | ORAL | Status: DC

## 2015-01-08 MED ORDER — MIDAZOLAM HCL 5 MG/5ML IJ SOLN
INTRAMUSCULAR | Status: DC | PRN
  Administered 2015-01-08: 2 mg via INTRAVENOUS

## 2015-01-08 MED ORDER — NEOSTIGMINE METHYLSULFATE 10 MG/10ML IV SOLN
INTRAVENOUS | Status: AC
  Filled 2015-01-08: qty 1

## 2015-01-08 MED ORDER — ONDANSETRON HCL 4 MG PO TABS: 4.0000 mg | ORAL_TABLET | Freq: Four times a day (QID) | ORAL | Status: DC | PRN

## 2015-01-08 MED ORDER — LACTATED RINGERS IV SOLN
INTRAVENOUS | Status: DC
  Administered 2015-01-08: 1000 mL via INTRAVENOUS

## 2015-01-08 MED ORDER — ROCURONIUM BROMIDE 100 MG/10ML IV SOLN
INTRAVENOUS | Status: DC | PRN
  Administered 2015-01-08: 10 mg via INTRAVENOUS
  Administered 2015-01-08: 35 mg via INTRAVENOUS
  Administered 2015-01-08: 5 mg via INTRAVENOUS
  Administered 2015-01-08: 20 mg via INTRAVENOUS

## 2015-01-08 MED ORDER — FENTANYL CITRATE 0.05 MG/ML IJ SOLN
INTRAMUSCULAR | Status: AC
  Filled 2015-01-08: qty 2

## 2015-01-08 MED ORDER — KCL IN DEXTROSE-NACL 20-5-0.45 MEQ/L-%-% IV SOLN
INTRAVENOUS | Status: DC
  Administered 2015-01-08 – 2015-01-09 (×2): via INTRAVENOUS
  Administered 2015-01-09: 100 mL/h via INTRAVENOUS
  Administered 2015-01-10: 10:00:00 via INTRAVENOUS
  Administered 2015-01-12: 50 mL/h via INTRAVENOUS
  Administered 2015-01-12: 09:00:00 via INTRAVENOUS
  Filled 2015-01-08 (×9): qty 1000

## 2015-01-08 MED ORDER — SCOPOLAMINE 1 MG/3DAYS TD PT72
1.0000 | MEDICATED_PATCH | TRANSDERMAL | Status: DC
  Administered 2015-01-08: 1.5 mg via TRANSDERMAL
  Filled 2015-01-08: qty 1

## 2015-01-08 MED ORDER — ACETAMINOPHEN 10 MG/ML IV SOLN
1000.0000 mg | Freq: Once | INTRAVENOUS | Status: AC
  Filled 2015-01-08: qty 100

## 2015-01-08 MED ORDER — PROPOFOL 10 MG/ML IV BOLUS
INTRAVENOUS | Status: DC | PRN
  Administered 2015-01-08: 50 mg via INTRAVENOUS
  Administered 2015-01-08: 150 mg via INTRAVENOUS

## 2015-01-08 MED ORDER — NEOSTIGMINE METHYLSULFATE 10 MG/10ML IV SOLN
INTRAVENOUS | Status: DC | PRN
  Administered 2015-01-08: 3 mg via INTRAVENOUS

## 2015-01-08 MED ORDER — ONDANSETRON HCL 4 MG/2ML IJ SOLN
4.0000 mg | Freq: Four times a day (QID) | INTRAMUSCULAR | Status: DC | PRN
  Administered 2015-01-08 – 2015-01-10 (×3): 4 mg via INTRAVENOUS
  Filled 2015-01-08 (×3): qty 2

## 2015-01-08 MED ORDER — SODIUM CHLORIDE 0.9 % IV SOLN
INTRAVENOUS | Status: DC | PRN
  Administered 2015-01-08: 10 mL via INTRAMUSCULAR

## 2015-01-08 MED ORDER — ACETAMINOPHEN 10 MG/ML IV SOLN
INTRAVENOUS | Status: DC | PRN
  Administered 2015-01-08: 1000 mg via INTRAVENOUS

## 2015-01-08 MED ORDER — PEG 3350-KCL-NA BICARB-NACL 420 G PO SOLR: 4000.0000 mL | Freq: Once | ORAL | Status: DC

## 2015-01-08 MED ORDER — SUFENTANIL CITRATE 50 MCG/ML IV SOLN
INTRAVENOUS | Status: AC
  Filled 2015-01-08: qty 1

## 2015-01-08 MED ORDER — DEXAMETHASONE SODIUM PHOSPHATE 10 MG/ML IJ SOLN
INTRAMUSCULAR | Status: AC
  Filled 2015-01-08: qty 1

## 2015-01-08 MED ORDER — ONDANSETRON HCL 4 MG/2ML IJ SOLN
INTRAMUSCULAR | Status: AC
  Filled 2015-01-08: qty 2

## 2015-01-08 MED ORDER — PROPOFOL 10 MG/ML IV BOLUS
INTRAVENOUS | Status: AC
  Filled 2015-01-08: qty 20

## 2015-01-08 MED ORDER — BUPIVACAINE LIPOSOME 1.3 % IJ SUSP
20.0000 mL | Freq: Once | INTRAMUSCULAR | Status: AC
  Administered 2015-01-08: 20 mL
  Filled 2015-01-08: qty 20

## 2015-01-08 MED ORDER — MEPERIDINE HCL 50 MG/ML IJ SOLN: 6.2500 mg | INTRAMUSCULAR | Status: DC | PRN

## 2015-01-08 MED ORDER — FENTANYL CITRATE 0.05 MG/ML IJ SOLN
25.0000 ug | INTRAMUSCULAR | Status: DC | PRN
  Administered 2015-01-08 (×3): 25 ug via INTRAVENOUS

## 2015-01-08 MED ORDER — LACTATED RINGERS IR SOLN
Status: DC | PRN
  Administered 2015-01-08: 1000 mL

## 2015-01-08 MED ORDER — DIPHENHYDRAMINE HCL 12.5 MG/5ML PO ELIX: 12.5000 mg | ORAL_SOLUTION | Freq: Four times a day (QID) | ORAL | Status: DC | PRN

## 2015-01-08 MED ORDER — EPHEDRINE SULFATE 50 MG/ML IJ SOLN
INTRAMUSCULAR | Status: DC | PRN
  Administered 2015-01-08: 10 mg via INTRAVENOUS
  Administered 2015-01-08: 5 mg via INTRAVENOUS

## 2015-01-08 MED ORDER — ROCURONIUM BROMIDE 100 MG/10ML IV SOLN
INTRAVENOUS | Status: AC
  Filled 2015-01-08: qty 1

## 2015-01-08 MED ORDER — PHENYLEPHRINE HCL 10 MG/ML IJ SOLN
INTRAMUSCULAR | Status: AC
  Filled 2015-01-08: qty 1

## 2015-01-08 MED ORDER — PIPERACILLIN-TAZOBACTAM 3.375 G IVPB 30 MIN
3.3750 g | Freq: Once | INTRAVENOUS | Status: AC
  Administered 2015-01-08: 3.375 g via INTRAVENOUS
  Filled 2015-01-08: qty 50

## 2015-01-08 MED ORDER — LACTATED RINGERS IV SOLN
INTRAVENOUS | Status: DC | PRN
  Administered 2015-01-08 (×3): via INTRAVENOUS

## 2015-01-08 MED ORDER — PROMETHAZINE HCL 25 MG/ML IJ SOLN
INTRAMUSCULAR | Status: AC
  Filled 2015-01-08: qty 1

## 2015-01-08 MED ORDER — SUCCINYLCHOLINE CHLORIDE 20 MG/ML IJ SOLN
INTRAMUSCULAR | Status: DC | PRN
  Administered 2015-01-08: 100 mg via INTRAVENOUS

## 2015-01-08 MED ORDER — HEPARIN SODIUM (PORCINE) 5000 UNIT/ML IJ SOLN
5000.0000 [IU] | Freq: Three times a day (TID) | INTRAMUSCULAR | Status: DC
  Administered 2015-01-08 – 2015-01-13 (×14): 5000 [IU] via SUBCUTANEOUS
  Filled 2015-01-08 (×17): qty 1

## 2015-01-08 MED ORDER — GLYCOPYRROLATE 0.2 MG/ML IJ SOLN
INTRAMUSCULAR | Status: AC
  Filled 2015-01-08: qty 4

## 2015-01-08 MED ORDER — METOPROLOL SUCCINATE ER 25 MG PO TB24
25.0000 mg | ORAL_TABLET | Freq: Every day | ORAL | Status: DC
  Administered 2015-01-08 – 2015-01-13 (×6): 25 mg via ORAL
  Filled 2015-01-08 (×6): qty 1

## 2015-01-08 MED ORDER — LIDOCAINE HCL (CARDIAC) 20 MG/ML IV SOLN
INTRAVENOUS | Status: DC | PRN
  Administered 2015-01-08: 75 mg via INTRAVENOUS
  Administered 2015-01-08: 25 mg via INTRATRACHEAL

## 2015-01-08 SURGICAL SUPPLY — 56 items
APPLIER CLIP 5 13 M/L LIGAMAX5 (MISCELLANEOUS)
APPLIER CLIP ROT 10 11.4 M/L (STAPLE)
APR CLP MED LRG 11.4X10 (STAPLE)
APR CLP MED LRG 5 ANG JAW (MISCELLANEOUS)
BLADE EXTENDED COATED 6.5IN (ELECTRODE) ×1 IMPLANT
BLADE HEX COATED 2.75 (ELECTRODE) ×4 IMPLANT
CABLE HIGH FREQUENCY MONO STRZ (ELECTRODE) ×1 IMPLANT
CELLS DAT CNTRL 66122 CELL SVR (MISCELLANEOUS) ×1 IMPLANT
CLIP APPLIE 5 13 M/L LIGAMAX5 (MISCELLANEOUS) IMPLANT
CLIP APPLIE ROT 10 11.4 M/L (STAPLE) IMPLANT
DECANTER SPIKE VIAL GLASS SM (MISCELLANEOUS) ×1 IMPLANT
DRAIN CHANNEL 19F RND (DRAIN) IMPLANT
DRAPE LAPAROSCOPIC ABDOMINAL (DRAPES) ×2 IMPLANT
DRSG OPSITE POSTOP 4X10 (GAUZE/BANDAGES/DRESSINGS) IMPLANT
DRSG OPSITE POSTOP 4X6 (GAUZE/BANDAGES/DRESSINGS) ×1 IMPLANT
DRSG OPSITE POSTOP 4X8 (GAUZE/BANDAGES/DRESSINGS) IMPLANT
ELECT REM PT RETURN 9FT ADLT (ELECTROSURGICAL) ×2
ELECTRODE REM PT RTRN 9FT ADLT (ELECTROSURGICAL) ×1 IMPLANT
GAUZE SPONGE 2X2 8PLY STRL LF (GAUZE/BANDAGES/DRESSINGS) IMPLANT
GAUZE SPONGE 4X4 12PLY STRL (GAUZE/BANDAGES/DRESSINGS) ×1 IMPLANT
GLOVE BIOGEL M 8.0 STRL (GLOVE) ×4 IMPLANT
GOWN STRL REUS W/TWL XL LVL3 (GOWN DISPOSABLE) ×8 IMPLANT
LEGGING LITHOTOMY PAIR STRL (DRAPES) IMPLANT
LIGASURE IMPACT 36 18CM CVD LR (INSTRUMENTS) IMPLANT
PACK COLON (CUSTOM PROCEDURE TRAY) ×2 IMPLANT
PACK GENERAL/GYN (CUSTOM PROCEDURE TRAY) ×2 IMPLANT
RETRACTOR WND ALEXIS 18 MED (MISCELLANEOUS) IMPLANT
RTRCTR WOUND ALEXIS 18CM MED (MISCELLANEOUS) ×2
SCISSORS LAP 5X45 EPIX DISP (ENDOMECHANICALS) ×2 IMPLANT
SET IRRIG TUBING LAPAROSCOPIC (IRRIGATION / IRRIGATOR) ×2 IMPLANT
SHEARS CURVED HARMONIC AC 45CM (MISCELLANEOUS) ×2 IMPLANT
SLEEVE ADV FIXATION 5X100MM (TROCAR) ×1 IMPLANT
SLEEVE XCEL OPT CAN 5 100 (ENDOMECHANICALS) ×4 IMPLANT
SPONGE GAUZE 2X2 STER 10/PKG (GAUZE/BANDAGES/DRESSINGS) ×1
SPONGE LAP 18X18 X RAY DECT (DISPOSABLE) IMPLANT
STAPLER VISISTAT 35W (STAPLE) ×2 IMPLANT
SUCTION POOLE TIP (SUCTIONS) ×2 IMPLANT
SUT NOVA 1 T20/GS 25DT (SUTURE) ×5 IMPLANT
SUT PDS AB 4-0 SH 27 (SUTURE) ×2 IMPLANT
SUT PROLENE 2 0 KS (SUTURE) IMPLANT
SUT SILK 2 0 (SUTURE) ×2
SUT SILK 2 0 SH CR/8 (SUTURE) ×2 IMPLANT
SUT SILK 2-0 18XBRD TIE 12 (SUTURE) ×1 IMPLANT
SUT SILK 3 0 (SUTURE) ×2
SUT SILK 3 0 SH CR/8 (SUTURE) ×4 IMPLANT
SUT SILK 3-0 18XBRD TIE 12 (SUTURE) ×1 IMPLANT
SUT VIC AB 2-0 SH 18 (SUTURE) ×1 IMPLANT
SUT VIC AB 4-0 SH 18 (SUTURE) ×1 IMPLANT
SYS LAPSCP GELPORT 120MM (MISCELLANEOUS)
SYSTEM LAPSCP GELPORT 120MM (MISCELLANEOUS) IMPLANT
TOWEL OR 17X26 10 PK STRL BLUE (TOWEL DISPOSABLE) ×4 IMPLANT
TRAY FOLEY CATH 14FRSI W/METER (CATHETERS) ×2 IMPLANT
TROCAR BLADELESS OPT 5 100 (ENDOMECHANICALS) ×2 IMPLANT
TROCAR XCEL BLUNT TIP 100MML (ENDOMECHANICALS) IMPLANT
TROCAR XCEL NON-BLD 11X100MML (ENDOMECHANICALS) ×1 IMPLANT
TUBING FILTER THERMOFLATOR (ELECTROSURGICAL) ×2 IMPLANT

## 2015-01-08 NOTE — Transfer of Care (Signed)
Immediate Anesthesia Transfer of Care Note  Patient: [name]  Procedure(s) Performed: Procedure(s): LAPAROSCOPIC ASSISTED RIGHT HEMI COLECTOMY (N/A)  Patient Location: PACU  Anesthesia Type:General  Level of Consciousness: awake, alert , oriented and patient cooperative  Airway & Oxygen Therapy: Patient Spontanous Breathing and Patient connected to face mask oxygen  Post-op Assessment: Report given to RN, Post -op Vital signs reviewed and stable and Patient moving all extremities X 4  Post vital signs: stable  Last Vitals:  Filed Vitals:   01/08/15 0902  BP: 144/74  Pulse: 86  Temp: 36.4 C  Resp: 16    Complications: No apparent anesthesia complications

## 2015-01-08 NOTE — Anesthesia Preprocedure Evaluation (Signed)
Anesthesia Evaluation  Patient identified by MRN, date of birth, ID band Patient awake    Reviewed: Allergy & Precautions, H&P , NPO status , Patient's Chart, lab work & pertinent test results, reviewed documented beta blocker date and time   Airway Mallampati: I  TM Distance: >3 FB Neck ROM: full    Dental no notable dental hx.    Pulmonary    Pulmonary exam normal       Cardiovascular Exercise Tolerance: Good hypertension, Pt. on home beta blockers  06/25/14 2D echo with EF of 73% and nl wall motion   Neuro/Psych    GI/Hepatic Neg liver ROS, hiatal hernia, GERD-  Medicated and Controlled,  Endo/Other  diabetes  Renal/GU negative Renal ROS  negative genitourinary   Musculoskeletal   Abdominal Normal abdominal exam  (+)   Peds  Hematology negative hematology ROS (+)   Anesthesia Other Findings   Reproductive/Obstetrics negative OB ROS                             Anesthesia Physical Anesthesia Plan  ASA: II  Anesthesia Plan: General   Post-op Pain Management:    Induction: Intravenous and Cricoid pressure planned  Airway Management Planned: Oral ETT  Additional Equipment:   Intra-op Plan:   Post-operative Plan: Extubation in OR  Informed Consent: I have reviewed the patients History and Physical, chart, labs and discussed the procedure including the risks, benefits and alternatives for the proposed anesthesia with the patient or authorized representative who has indicated his/her understanding and acceptance.   Dental Advisory Given  Plan Discussed with: CRNA and Surgeon  Anesthesia Plan Comments:         Anesthesia Quick Evaluation

## 2015-01-08 NOTE — Interval H&P Note (Signed)
History and Physical Interval Note:  01/08/2015 10:39 AM  [name]  has presented today for surgery, with the diagnosis of ADENOMAS OF THE RIGHT COLON  The various methods of treatment have been discussed with the patient and family. After consideration of risks, benefits and other options for treatment, the patient has consented to  Procedure(s): LAPAROSCOPIC ASSISTED RIGHT HEMI COLECTOMY (N/A) as a surgical intervention .  The patient's history has been reviewed, patient examined, no change in status, stable for surgery.  I have reviewed the patient's chart and labs.  Questions were answered to the patient's satisfaction.     Kenneith Stief B

## 2015-01-08 NOTE — H&P (View-Only) (Signed)
Kerry Kass Boyadjian 11/27/2014 3:42 PM Location: [facility] Patient #: [id number] DOB: [birth year] Divorced / Language: Cleophus Molt / Race: White Female  History of Present Illness Rodman Key B. Hassell Done MD; 11/27/2014 4:49 PM) Patient words: For right hemicolectomy  Per Dr. 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 She is here to discuss possible right hemicolectomy. She has currently a cecal polyp and a descending colon polyp which we attempted to remove on colonoscopy in April 2015 adenoma but unfortunately patient had a post-polypectomy burn and had to be hospitalized .She did not require surgery. The polyp was carpeted and deep and was not removed entirely. The cecal polyp was villoadenomatous and the ascending colon polyp was adenomatous. It tattooed both polyps and also placed endoclips She since then has had right lower quadrant abdominal pain, but CT scans of the abdomen fail to show any inflammatory changes in the right colon.. I recommended the right hemicolectomy to avoid future risk of colon cancer aortic stenosis mentioned above.She agrees with the plan. She had numerous colonoscopies starting in 1985. 1994 when she had one and half centimeter polyp at 15 cm with invasive adenocarcinoma. As well as ascending colon polyp which was adenomatous in March 1994 flexible sigmoidoscopy showed complete resolution of the sigmoid polyp. In September 1997 colonoscopy showed cecal polyp which was hyperplastic. In March 2002 colonoscopy again showed cecal polyp which was tubovillous adenoma. In May 2007 she descending colon polyp which was adenomatous. In August 2010 colonoscopy showed cecal and ascending colon tubular adenomas. In April 2015 she had a tubulovillous adenoma of the cecum and tubular adenoma of the ascending colon.as mentioned above. She has a history of gastroesophageal  reflux and Nissan fundoplication by Dr. Hassell Done. She has been satisfied with the results. She has low back pain for which she has been undergoing injections. and has been ambulating with walker due to RSD.  I discussed right hemicolectomy with her in some detail. I would do this laparoscopically-assisted fashion. We did discuss the risk of colostomy although I think that's low. Will proceed with scheduling the right hemicolectomy.  The patient is a 80 year old female    Other Problems Marjean Donna, CMA; 11/27/2014 3:43 PM) Arthritis Back Pain Cholelithiasis Diabetes Mellitus Diverticulosis Gastric Ulcer Gastroesophageal Reflux Disease Hemorrhoids High blood pressure Kidney Stone Migraine Headache Pulmonary Embolism / Blood Clot in Legs Vascular Disease  Past Surgical History Marjean Donna, CMA; 11/27/2014 3:43 PM) Cataract Surgery Bilateral. Colon Polyp Removal - Colonoscopy Foot Surgery Bilateral. Nissen Fundoplication Oral Surgery Shoulder Surgery Bilateral. Spinal Surgery - Neck  Diagnostic Studies History Marjean Donna, CMA; 11/27/2014 3:43 PM) Colonoscopy within last year Mammogram within last year Pap Smear 1-5 years ago  Allergies Marjean Donna, CMA; 11/27/2014 3:49 PM) Cymbalta *ANTIDEPRESSANTS* Gabapentin *ANTICONVULSANTS* Adhesive Tape 1"x5yd *MEDICAL DEVICES AND SUPPLIES* CeFAZolin in D5W *CEPHALOSPORINS* Ciprofloxacin *Fluoroquinolones** Clorazepate Dipotassium *ANTIANXIETY AGENTS* Doxycycline *DERMATOLOGICALS* Dilaudid *ANALGESICS - OPIOID* Enablex *URINARY ANTISPASMODICS* Levofloxacin *CHEMICALS* Lyrica *ANTICONVULSANTS* Methadone HCl *ANALGESICS - OPIOID* Morphine Sulfate (Concentrate) *ANALGESICS - OPIOID* Pentazocine-Acetaminophen *ANALGESICS - OPIOID*  Medication History (Sonya Bynum, CMA; 11/27/2014 3:45 PM) Amlodipine Besy-Benazepril HCl (5-10MG  Capsule, Oral) Active. Clobetasol Propionate (0.05% Cream, External)  Active. Clopidogrel Bisulfate (75MG  Tablet, Oral) Active. Nitrostat (0.4MG  Tab Sublingual, Sublingual as needed) Active. Nabumetone (750MG  Tablet, Oral) Active. Nortriptyline HCl (10MG  Capsule, Oral) Active. Omeprazole (20MG  Capsule DR, Oral) Active. Ranitidine HCl (150MG  Tablet, Oral) Active. TiZANidine HCl (4MG   Tablet, Oral) Active. FreeStyle Lancets Active. FreeStyle Lite Test (In Vitro) Active. Clindamycin HCl (300MG  Capsule, Oral) Active. Trimethoprim (100MG  Tablet, Oral) Active.  Social History ([place]; 11/27/2014 3:43 PM) Caffeine use Coffee. No alcohol use No drug use Tobacco use Never smoker.  Family History Marjean Donna, Andrew; 11/27/2014 3:43 PM) Arthritis Mother. Colon Polyps Daughter. Diabetes Mellitus Father. Hypertension Mother, Son.  Pregnancy / Birth History Marjean Donna, [place]; 11/27/2014 3:43 PM) Age at menarche 68 years. Age of menopause <47 Contraceptive History Intrauterine device, Oral contraceptives. Gravida 3 Maternal age 6-25 Para 3 Regular periods  Review of Systems Davy Pique Bynum CMA; 11/27/2014 3:43 PM) General Not Present- Appetite Loss, Chills, Fatigue, Fever, Night Sweats, Weight Gain and Weight Loss. Skin Present- Change in Wart/Mole and Rash. Not Present- Dryness, Hives, Jaundice, New Lesions, Non-Healing Wounds and Ulcer. HEENT Present- Hearing Loss, Oral Ulcers, Seasonal Allergies and Wears glasses/contact lenses. Not Present- Earache, Hoarseness, Nose Bleed, Ringing in the Ears, Sinus Pain, Sore Throat, Visual Disturbances and Yellow Eyes. Respiratory Not Present- Bloody sputum, Chronic Cough, Difficulty Breathing, Snoring and Wheezing. Gastrointestinal Present- Abdominal Pain, Constipation, Excessive gas, Gets full quickly at meals, Hemorrhoids and Indigestion. Not Present- Bloating, Bloody Stool, Change in Bowel Habits, Chronic diarrhea, Difficulty Swallowing, Nausea, Rectal Pain and Vomiting. Musculoskeletal  Present- Back Pain, Joint Pain, Joint Stiffness and Muscle Weakness. Not Present- Muscle Pain and Swelling of Extremities. Neurological Present- Numbness, Trouble walking and Weakness. Not Present- Decreased Memory, Fainting, Headaches, Seizures, Tingling and Tremor. Endocrine Present- Cold Intolerance. Not Present- Excessive Hunger, Hair Changes, Heat Intolerance, Hot flashes and New Diabetes. Hematology Not Present- Easy Bruising, Excessive bleeding, Gland problems, HIV and Persistent Infections.   Vitals (Sonya Bynum CMA; 11/27/2014 3:44 PM) 11/27/2014 3:43 PM Weight: 194 lb Height: 64in Body Surface Area: 1.99 m Body Mass Index: 33.3 kg/m Temp.: 97.72F(Temporal)  Pulse: 79 (Regular)  BP: 124/76 (Sitting, Left Arm, Standard)    Physical Exam (Fernado Brigante B. Hassell Done MD; 11/27/2014 4:53 PM) The physical exam findings are as follows: Note:Normally developed white female in no distress. Head: normocephalic Eyes; nonicteric PERRR Neck: no bruits Chest: clear Heart: SR without murmurs Abdomen: nontender Ext: wearing a brace on her left leg and neuropathic changes on either side.    Assessment & Plan Rodman Key B. Hassell Done MD; 11/27/2014 4:56 PM) ADENOMATOUS POLYPS (229.9  D36.9) Impression: schedule for right hemicolectomy Current Plans  Started Neomycin Sulfate 500MG , 2 (two) Tablet SEE NOTE, #6, 11/27/2014, No Refill. Local Order: TAKE TWO TABLETS AT 2 PM, 3 PM, AND 10 PM THE DAY PRIOR TO SURGERY

## 2015-01-08 NOTE — Anesthesia Postprocedure Evaluation (Signed)
Anesthesia Post Note  Patient: [name]  Procedure(s) Performed: Procedure(s) (LRB): LAPAROSCOPIC ASSISTED RIGHT HEMI COLECTOMY (N/A)  Anesthesia type: General  Patient location: PACU  Post pain: Pain level controlled  Post assessment: Post-op Vital signs reviewed  Last Vitals:  Filed Vitals:   01/08/15 1445  BP: 145/64  Pulse: 60  Temp:   Resp: 15    Post vital signs: Reviewed  Level of consciousness: sedated  Complications: No apparent anesthesia complications

## 2015-01-08 NOTE — Brief Op Note (Signed)
01/08/2015  2:16 PM  PATIENT:  [name]  80 y.o. female  PRE-OPERATIVE DIAGNOSIS:  ADENOMAS OF THE RIGHT COLON  POST-OPERATIVE DIAGNOSIS:  ADENOMAS OF THE RIGHT COLON  PROCEDURE:  Procedure(s): LAPAROSCOPIC ASSISTED RIGHT HEMI COLECTOMY (N/A)  SURGEON:  Surgeon(s) and Role:    * Johnathan Hausen, MD - Primary    * Autumn Messing III, MD - Assisting  PHYSICIAN ASSISTANT:   ASSISTANTS: Star Age, MD, FACS   ANESTHESIA:   general  EBL:  Total I/O In: 2000 [I.V.:2000] Out: 325 [Urine:225; Blood:100]  BLOOD ADMINISTERED:none  DRAINS: none   LOCAL MEDICATIONS USED:  BUPIVICAINE   SPECIMEN:  Source of Specimen:  right colon  DISPOSITION OF SPECIMEN:  PATHOLOGY  COUNTS:  YES  TOURNIQUET:  * No tourniquets in log *  DICTATION: .Other Dictation: Dictation Number [id number]  PLAN OF CARE: Admit to inpatient   PATIENT DISPOSITION:  PACU - hemodynamically stable.   Delay start of Pharmacological VTE agent (>24hrs) due to surgical blood loss or risk of bleeding: no

## 2015-01-09 LAB — GLUCOSE, CAPILLARY
Glucose-Capillary: 171 mg/dL — ABNORMAL HIGH (ref 70–99)
Glucose-Capillary: 174 mg/dL — ABNORMAL HIGH (ref 70–99)
Glucose-Capillary: 169 mg/dL — ABNORMAL HIGH (ref 70–99)
Glucose-Capillary: 159 mg/dL — ABNORMAL HIGH (ref 70–99)

## 2015-01-09 LAB — BASIC METABOLIC PANEL
Anion gap: 11 (ref 5–15)
BUN: 10 mg/dL (ref 6–23)
CO2: 23 mmol/L (ref 19–32)
Calcium: 8.5 mg/dL (ref 8.4–10.5)
Chloride: 101 mmol/L (ref 96–112)
Creatinine, Ser: 0.69 mg/dL (ref 0.50–1.10)
GFR calc Af Amer: >90 mL/min (ref >90)
GFR calc non Af Amer: 82 mL/min — ABNORMAL LOW (ref >90)
Glucose, Bld: 243 mg/dL — ABNORMAL HIGH (ref 70–99)
Potassium: 3.8 mmol/L (ref 3.5–5.1)
Sodium: 135 mmol/L (ref 135–145)

## 2015-01-09 LAB — HEMOGLOBIN A1C
Hgb A1c MFr Bld: 7.1 % — ABNORMAL HIGH (ref 4.8–5.6)
Mean Plasma Glucose: 157 mg/dL

## 2015-01-09 LAB — CBC
HCT: 36.5 % (ref 36.0–46.0)
Hemoglobin: 12 g/dL (ref 12.0–15.0)
MCH: 28.8 pg (ref 26.0–34.0)
MCHC: 32.9 g/dL (ref 30.0–36.0)
MCV: 87.7 fL (ref 78.0–100.0)
Platelets: 181 10*3/uL (ref 150–400)
RBC: 4.16 MIL/uL (ref 3.87–5.11)
RDW: 13.2 % (ref 11.5–15.5)
WBC: 9 10*3/uL (ref 4.0–10.5)

## 2015-01-09 MED ORDER — OXYCODONE-ACETAMINOPHEN 5-325 MG PO TABS
1.0000 | ORAL_TABLET | ORAL | Status: DC | PRN
  Administered 2015-01-09 – 2015-01-13 (×16): 2 via ORAL
  Filled 2015-01-09 (×16): qty 2

## 2015-01-09 MED ORDER — INSULIN ASPART 100 UNIT/ML ~~LOC~~ SOLN
0.0000 [IU] | SUBCUTANEOUS | Status: DC
  Administered 2015-01-09 (×4): 3 [IU] via SUBCUTANEOUS
  Administered 2015-01-10 – 2015-01-11 (×8): 2 [IU] via SUBCUTANEOUS
  Administered 2015-01-12: 5 [IU] via SUBCUTANEOUS
  Administered 2015-01-12 (×2): 3 [IU] via SUBCUTANEOUS
  Administered 2015-01-12: 2 [IU] via SUBCUTANEOUS
  Administered 2015-01-13: 3 [IU] via SUBCUTANEOUS
  Administered 2015-01-13: 2 [IU] via SUBCUTANEOUS

## 2015-01-09 MED ORDER — MORPHINE SULFATE 2 MG/ML IJ SOLN
2.0000 mg | INTRAMUSCULAR | Status: DC | PRN
  Administered 2015-01-09: 4 mg via INTRAVENOUS
  Administered 2015-01-10 – 2015-01-12 (×2): 2 mg via INTRAVENOUS
  Filled 2015-01-09: qty 2
  Filled 2015-01-09 (×2): qty 1

## 2015-01-09 MED ORDER — MORPHINE SULFATE 2 MG/ML IJ SOLN
INTRAMUSCULAR | Status: AC
  Administered 2015-01-09: 2 mg via INTRAVENOUS
  Filled 2015-01-09: qty 1

## 2015-01-09 NOTE — Op Note (Signed)
[name], [name] NO.:  [id number]  MEDICAL RECORD NO.:  [id number]  LOCATION:  5732                         FACILITY:  [facility]  PHYSICIAN:  Isabel Caprice. Hassell Done, MD  DATE OF BIRTH:  1935-01-26  DATE OF PROCEDURE: DATE OF DISCHARGE:                              OPERATIVE REPORT   PREOPERATIVE INDICATION:  A 80 year old white female with dysplastic polyps in the right colon.  PROCEDURE:  Laparoscopically-assisted right hemicolectomy.  SURGEON:  Isabel Caprice. Hassell Done, MD  ASSISTANT:  Sammuel Hines. Daiva Nakayama, M.D.  ANESTHESIA:  General endotracheal.  DESCRIPTION OF PROCEDURE:  The patient was taken to OR #1 on Friday, January 07, 2015, and given general anesthesia.  The abdomen was prepped with PCMX and draped sterilely.  Access of the abdomen was achieved through the left upper quadrant using a 5-mm Optiview.  The abdomen was insufflated and another 5 was placed on the left lower quadrant and 1 in the upper midline just above the umbilicus and then 1 in the right lower quadrant.  Through these, I used the cordless Harmonic Covidien to take down and mobilized the right colon completely.  I found the tattooed area and hepatic flexure.  I took this away from the liver where the previous cholecystectomy had been and things had been stuck up there. We did not disturb the duodenum.  Once it was mobilized, I then made a small upper midline incision approximately 9 cm using one of the trocar sites incorporating that into the wound.  I then inserted the Randlett and with that in place, we extracted the colon and then divided it in the distal ileum and then in the transverse colon removing the tattooed area.  I then went through the mesentery with Harmonic scalpel, clamping the vessels with Kelly clamps and oversewn whenever needed with 2-0 Vicryl.  I also used a harmonic as well.  Specimen was removed and sent for permanent sections.  In the meanwhile, the terminal ileum was  laid up against the transverse colon along the tenia.  Suture held those together.  Openings were made in either one with the harmonic and then the blades of the GIA7.5 were inserted and then fired.  Defect was closed from either end with 4-0 PDS in a canal fashion and then with 3-0 silk seromuscular sutures.  A crotch stitch of 3-0 silk was also used and was much of a mesenteric defect to close.  The midline incision was then closed with interrupted #1 Novafil.  The wound was injected with Exparel, diluted to 30 mL and then reinsufflated the abdomen and looked in with a scope and the closure looked good.  Did not see any evidence of bleeding.  I then infiltrated with the Exparel, irrigated and then closed the skin with staples and the three remaining trocar sites with 4-0 Vicryl and LiquiBand.  We followed the colon protocol in terms of changing gowns, gloves and instruments.  The patient tolerated the procedure well, was taken to the recovery room in satisfactory condition.     Isabel Caprice Hassell Done, MD     MBM/MEDQ  D:  01/08/2015  T:  01/09/2015  Job:  [id number]  cc:   Lowella Bandy. Olevia Perches, [place] [address]

## 2015-01-09 NOTE — Progress Notes (Signed)
Patient ID: [name], female   DOB: [birth year], 80 y.o.   MRN: [id number] 1 Day Post-Op  Subjective: Complaining of dry mouth. Some occasional nausea. Moderate abdominal pain relieved with meds.  Objective: Vital signs in last 24 hours: Temp:  [97.6 F (36.4 C)-99.6 F (37.6 C)] 99.6 F (37.6 C) (03/19 0622) Pulse Rate:  [57-87] 80 (03/19 0622) Resp:  [13-18] 18 (03/19 0622) BP: (136-174)/(56-76) 149/69 mmHg (03/19 0622) SpO2:  [98 %-100 %] 100 % (03/19 0622) Weight:  [90.266 kg (199 lb)] 90.266 kg (199 lb) (03/18 0919)    Intake/Output from previous day: 03/18 0701 - 03/19 0700 In: 3685 [I.V.:3585; IV Piggyback:100] Out: 4025 [Urine:3925; Blood:100] Intake/Output this shift:    General appearance: alert, cooperative and no distress GI: mild appropriate tenderness Incision/Wound: no erythema or drainage  Lab Results:   Recent Labs  01/08/15 1651 01/09/15 0622  WBC 10.6* 9.0  HGB 13.1 12.0  HCT 40.6 36.5  PLT 191 181   BMET  Recent Labs  01/08/15 1651 01/09/15 0622  NA  --  135  K  --  3.8  CL  --  101  CO2  --  23  GLUCOSE  --  243*  BUN  --  10  CREATININE 0.73 0.69  CALCIUM  --  8.5     Studies/Results: No results found.  Anti-infectives: Anti-infectives    Start     Dose/Rate Route Frequency Ordered Stop   01/08/15 1115  piperacillin-tazobactam (ZOSYN) IVPB 3.375 g     3.375 g 100 mL/hr over 30 Minutes Intravenous  Once 01/08/15 1049 01/08/15 1111   01/08/15 0904  cefoTEtan (CEFOTAN) 2 g in dextrose 5 % 50 mL IVPB  Status:  Discontinued     2 g 100 mL/hr over 30 Minutes Intravenous On call to O.R. 01/08/15 0904 01/08/15 1049   01/08/15 0904  neomycin (MYCIFRADIN) tablet 1,000 mg  Status:  Discontinued     1,000 mg Oral 3 times per day 01/08/15 0904 01/08/15 0913   01/08/15 0904  erythromycin (E-MYCIN) tablet 1,000 mg  Status:  Discontinued     1,000 mg Oral 3 times per day 01/08/15 0904 01/08/15 0913      Assessment/Plan: s/p  Procedure(s): LAPAROSCOPIC ASSISTED RIGHT HEMI COLECTOMY Doing well postoperatively. Limited to sips and ice chips due to nausea. Ambulation and deep breathing encouraged.   LOS: 1 day    Brayten Komar T 01/09/2015

## 2015-01-10 LAB — GLUCOSE, CAPILLARY
Glucose-Capillary: 104 mg/dL — ABNORMAL HIGH (ref 70–99)
Glucose-Capillary: 129 mg/dL — ABNORMAL HIGH (ref 70–99)
Glucose-Capillary: 130 mg/dL — ABNORMAL HIGH (ref 70–99)
Glucose-Capillary: 138 mg/dL — ABNORMAL HIGH (ref 70–99)
Glucose-Capillary: 138 mg/dL — ABNORMAL HIGH (ref 70–99)
Glucose-Capillary: 142 mg/dL — ABNORMAL HIGH (ref 70–99)
Glucose-Capillary: 84 mg/dL (ref 70–99)

## 2015-01-10 NOTE — Progress Notes (Signed)
UR Completed.  [phone]  

## 2015-01-10 NOTE — Progress Notes (Signed)
Patient ID: [name], female   DOB: February 09, 1935, 80 y.o.   MRN: [id number] 2 Days Post-Op  Subjective: Feels better than yesterday. No nausea currently. Had a small bowel movement. States she still has significant abdominal pain but relieved with medications  Objective: Vital signs in last 24 hours: Temp:  [98.2 F (36.8 C)-98.9 F (37.2 C)] 98.2 F (36.8 C) (03/20 0543) Pulse Rate:  [70-76] 70 (03/20 0543) Resp:  [18] 18 (03/20 0543) BP: (133-160)/(58-72) 141/72 mmHg (03/20 0543) SpO2:  [100 %] 100 % (03/20 0543) Last BM Date: 01/09/15  Intake/Output from previous day: 03/19 0701 - 03/20 0700 In: 2700 [I.V.:2700] Out: 3302 [Urine:3300; Stool:2] Intake/Output this shift: Total I/O In: 0  Out: 600 [Urine:600]  General appearance: alert, cooperative and no distress GI: moderate appropriate incisional tenderness. Nondistended. Incision/Wound: slight bloody drainage without evidence of infection  Lab Results:   Recent Labs  01/08/15 1651 01/09/15 0622  WBC 10.6* 9.0  HGB 13.1 12.0  HCT 40.6 36.5  PLT 191 181   BMET  Recent Labs  01/08/15 1651 01/09/15 0622  NA  --  135  K  --  3.8  CL  --  101  CO2  --  23  GLUCOSE  --  243*  BUN  --  10  CREATININE 0.73 0.69  CALCIUM  --  8.5     Studies/Results: No results found.  Anti-infectives: Anti-infectives    Start     Dose/Rate Route Frequency Ordered Stop   01/08/15 1115  piperacillin-tazobactam (ZOSYN) IVPB 3.375 g     3.375 g 100 mL/hr over 30 Minutes Intravenous  Once 01/08/15 1049 01/08/15 1111   01/08/15 0904  cefoTEtan (CEFOTAN) 2 g in dextrose 5 % 50 mL IVPB  Status:  Discontinued     2 g 100 mL/hr over 30 Minutes Intravenous On call to O.R. 01/08/15 0904 01/08/15 1049   01/08/15 0904  neomycin (MYCIFRADIN) tablet 1,000 mg  Status:  Discontinued     1,000 mg Oral 3 times per day 01/08/15 0904 01/08/15 0913   01/08/15 0904  erythromycin (E-MYCIN) tablet 1,000 mg  Status:  Discontinued     1,000  mg Oral 3 times per day 01/08/15 0904 01/08/15 0913      Assessment/Plan: s/p Procedure(s): LAPAROSCOPIC ASSISTED RIGHT HEMI COLECTOMY Doing well without apparent complication. Start clear liquid diet. Ambulation encouraged.    LOS: 2 days    Ayinde Swim T 01/10/2015

## 2015-01-11 ENCOUNTER — Encounter (HOSPITAL_COMMUNITY): Payer: Self-pay | Admitting: Surgery

## 2015-01-11 LAB — GLUCOSE, CAPILLARY
Glucose-Capillary: 135 mg/dL — ABNORMAL HIGH (ref 70–99)
Glucose-Capillary: 114 mg/dL — ABNORMAL HIGH (ref 70–99)
Glucose-Capillary: 135 mg/dL — ABNORMAL HIGH (ref 70–99)
Glucose-Capillary: 131 mg/dL — ABNORMAL HIGH (ref 70–99)
Glucose-Capillary: 112 mg/dL — ABNORMAL HIGH (ref 70–99)

## 2015-01-11 MED ORDER — CLOBETASOL PROPIONATE 0.05 % EX CREA
TOPICAL_CREAM | Freq: Two times a day (BID) | CUTANEOUS | Status: DC
  Administered 2015-01-11 – 2015-01-12 (×4): via TOPICAL
  Administered 2015-01-13: 1 via TOPICAL
  Filled 2015-01-11: qty 15

## 2015-01-11 NOTE — Progress Notes (Signed)
Patient ID: [name], female   DOB: [birth year], 80 y.o.   MRN: [id number] [facility] Surgery Progress Note:   3 Days Post-Op  Subjective: Mental status is clear.  Many questions but no complaints.   Objective: Vital signs in last 24 hours: Temp:  [97.9 F (36.6 C)-98.8 F (37.1 C)] 98.6 F (37 C) (03/21 0526) Pulse Rate:  [69-84] 71 (03/21 0526) Resp:  [16-20] 18 (03/21 0526) BP: (120-146)/(53-75) 140/75 mmHg (03/21 0526) SpO2:  [98 %-100 %] 99 % (03/21 0526)  Intake/Output from previous day: 03/20 0701 - 03/21 0700 In: 1880 [I.V.:1880] Out: 2175 [Urine:2175] Intake/Output this shift:    Physical Exam: Work of breathing is normal.  Incision covered  Lab Results:  Results for orders placed or performed during the hospital encounter of 01/08/15 (from the past 48 hour(s))  Glucose, capillary     Status: Abnormal   Collection Time: 01/09/15 10:31 AM  Result Value Ref Range   Glucose-Capillary 171 (H) 70 - 99 mg/dL   Comment 1 Notify RN   Glucose, capillary     Status: Abnormal   Collection Time: 01/09/15 12:32 PM  Result Value Ref Range   Glucose-Capillary 174 (H) 70 - 99 mg/dL   Comment 1 Notify RN   Glucose, capillary     Status: Abnormal   Collection Time: 01/09/15  4:43 PM  Result Value Ref Range   Glucose-Capillary 169 (H) 70 - 99 mg/dL   Comment 1 Notify RN   Glucose, capillary     Status: Abnormal   Collection Time: 01/09/15  8:03 PM  Result Value Ref Range   Glucose-Capillary 159 (H) 70 - 99 mg/dL   Comment 1 Notify RN    Comment 2 Document in Chart   Glucose, capillary     Status: Abnormal   Collection Time: 01/09/15 11:59 PM  Result Value Ref Range   Glucose-Capillary 129 (H) 70 - 99 mg/dL   Comment 1 Notify RN   Glucose, capillary     Status: Abnormal   Collection Time: 01/10/15  3:27 AM  Result Value Ref Range   Glucose-Capillary 130 (H) 70 - 99 mg/dL   Comment 1 Notify RN   Glucose, capillary     Status: Abnormal   Collection Time: 01/10/15   7:54 AM  Result Value Ref Range   Glucose-Capillary 138 (H) 70 - 99 mg/dL  Glucose, capillary     Status: Abnormal   Collection Time: 01/10/15 11:51 AM  Result Value Ref Range   Glucose-Capillary 142 (H) 70 - 99 mg/dL  Glucose, capillary     Status: Abnormal   Collection Time: 01/10/15  4:05 PM  Result Value Ref Range   Glucose-Capillary 104 (H) 70 - 99 mg/dL   Comment 1 Document in Chart    Comment 2 Repeat Test   Glucose, capillary     Status: Abnormal   Collection Time: 01/10/15  8:27 PM  Result Value Ref Range   Glucose-Capillary 138 (H) 70 - 99 mg/dL  Glucose, capillary     Status: None   Collection Time: 01/10/15 11:21 PM  Result Value Ref Range   Glucose-Capillary 84 70 - 99 mg/dL  Glucose, capillary     Status: Abnormal   Collection Time: 01/11/15  3:46 AM  Result Value Ref Range   Glucose-Capillary 135 (H) 70 - 99 mg/dL    Radiology/Results: No results found.  Anti-infectives: Anti-infectives    Start     Dose/Rate Route Frequency Ordered Stop  01/08/15 1115  piperacillin-tazobactam (ZOSYN) IVPB 3.375 g     3.375 g 100 mL/hr over 30 Minutes Intravenous  Once 01/08/15 1049 01/08/15 1111   01/08/15 0904  cefoTEtan (CEFOTAN) 2 g in dextrose 5 % 50 mL IVPB  Status:  Discontinued     2 g 100 mL/hr over 30 Minutes Intravenous On call to O.R. 01/08/15 0904 01/08/15 1049   01/08/15 0904  neomycin (MYCIFRADIN) tablet 1,000 mg  Status:  Discontinued     1,000 mg Oral 3 times per day 01/08/15 0904 01/08/15 0913   01/08/15 0904  erythromycin (E-MYCIN) tablet 1,000 mg  Status:  Discontinued     1,000 mg Oral 3 times per day 01/08/15 3007 01/08/15 0913      Assessment/Plan: Problem List: Patient Active Problem List   Diagnosis Date Noted  . S/P partial colectomy 01/08/2015  . 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 Colitis 01/28/2014  . Anemia 01/18/2014  . Nonspecific abnormal unspecified cardiovascular function study 10/31/2013  .  Rectal bleeding 10/31/2013  . Hemorrhoids 10/31/2013  . CAD (coronary artery disease)   . Carcinoma in situ in a polyp   . Depression 09/17/2013  . Charcot's joint of right foot S/P Reconstruction 08/21/2013  . Osteoarthritis 08/21/2013  . Foot osteomyelitis, right 07/16/2013  . Extrinsic asthma, unspecified 07/16/2013  . GERD (gastroesophageal reflux disease)   . Neuropathy   . Hyperthyroidism   . Lower extremity edema 03/08/2011  . Annual physical exam 03/08/2011  . ALLERGIC RHINITIS 04/15/2010  . ORTHOSTATIC DIZZINESS 04/13/2010  . HIATAL HERNIA 12/06/2009  . GASTRIC ULCER, HX OF 12/06/2009  . DYSPNEA 03/18/2009  . UTI'S, RECURRENT 07/07/2008  . DEGENERATIVE JOINT DISEASE 06/03/2008  . COLONIC POLYPS 01/27/2008  . DIVERTICULOSIS, COLON 01/27/2008  . IRRITABLE BOWEL SYNDROME, HX OF 01/27/2008  . PULMONARY NODULE 01/22/2008  . HYPERLIPIDEMIA 12/30/2007  . Osteoporosis 12/30/2007  . TB SKIN TEST, POSITIVE 08/09/2007  . DM II (diabetes mellitus, type II), controlled 05/09/2007  . Reflex sympathetic dystrophy-- pain mngmt  05/09/2007  . Essential hypertension 04/15/2007  . GERD 04/15/2007    Tolerating clears.  Will advance to full liquids.  Decrease IV fluids.   3 Days Post-Op    LOS: 3 days   Matt B. Hassell Done, MD, [facility] [phone] beeper [phone]  01/11/2015 7:22 AM

## 2015-01-12 LAB — GLUCOSE, CAPILLARY
Glucose-Capillary: 102 mg/dL — ABNORMAL HIGH (ref 70–99)
Glucose-Capillary: 107 mg/dL — ABNORMAL HIGH (ref 70–99)
Glucose-Capillary: 114 mg/dL — ABNORMAL HIGH (ref 70–99)
Glucose-Capillary: 132 mg/dL — ABNORMAL HIGH (ref 70–99)
Glucose-Capillary: 133 mg/dL — ABNORMAL HIGH (ref 70–99)
Glucose-Capillary: 141 mg/dL — ABNORMAL HIGH (ref 70–99)
Glucose-Capillary: 187 mg/dL — ABNORMAL HIGH (ref 70–99)
Glucose-Capillary: 205 mg/dL — ABNORMAL HIGH (ref 70–99)

## 2015-01-12 NOTE — Progress Notes (Signed)
Patient ID: [name], female   DOB: 1934-11-24, 80 y.o.   MRN: [id number] [facility] Surgery Progress Note:   4 Days Post-Op  Subjective: Mental status is clear.  Anticipating discharge tomorrow Objective: Vital signs in last 24 hours: Temp:  [98.1 F (36.7 C)-98.2 F (36.8 C)] 98.1 F (36.7 C) (03/22 0546) Pulse Rate:  [76-79] 76 (03/22 0546) Resp:  [18] 18 (03/22 0546) BP: (116-168)/(55-71) 138/60 mmHg (03/22 0546) SpO2:  [97 %-100 %] 97 % (03/22 0546)  Intake/Output from previous day: 03/21 0701 - 03/22 0700 In: 2774.6 [P.O.:1560; I.V.:1214.6] Out: 2550 [Urine:2550] Intake/Output this shift: Total I/O In: 219.2 [I.V.:219.2] Out: -   Physical Exam: Work of breathing is normal.  Incision covered.  Lab Results:  Results for orders placed or performed during the hospital encounter of 01/08/15 (from the past 48 hour(s))  Glucose, capillary     Status: Abnormal   Collection Time: 01/10/15 11:51 AM  Result Value Ref Range   Glucose-Capillary 142 (H) 70 - 99 mg/dL  Glucose, capillary     Status: Abnormal   Collection Time: 01/10/15  4:05 PM  Result Value Ref Range   Glucose-Capillary 104 (H) 70 - 99 mg/dL   Comment 1 Document in Chart    Comment 2 Repeat Test   Glucose, capillary     Status: Abnormal   Collection Time: 01/10/15  8:27 PM  Result Value Ref Range   Glucose-Capillary 138 (H) 70 - 99 mg/dL  Glucose, capillary     Status: None   Collection Time: 01/10/15 11:21 PM  Result Value Ref Range   Glucose-Capillary 84 70 - 99 mg/dL  Glucose, capillary     Status: Abnormal   Collection Time: 01/11/15  3:46 AM  Result Value Ref Range   Glucose-Capillary 135 (H) 70 - 99 mg/dL  Glucose, capillary     Status: Abnormal   Collection Time: 01/11/15  7:48 AM  Result Value Ref Range   Glucose-Capillary 114 (H) 70 - 99 mg/dL  Glucose, capillary     Status: Abnormal   Collection Time: 01/11/15 12:50 PM  Result Value Ref Range   Glucose-Capillary 135 (H) 70 - 99  mg/dL  Glucose, capillary     Status: Abnormal   Collection Time: 01/11/15  4:10 PM  Result Value Ref Range   Glucose-Capillary 131 (H) 70 - 99 mg/dL  Glucose, capillary     Status: Abnormal   Collection Time: 01/11/15  7:48 PM  Result Value Ref Range   Glucose-Capillary 112 (H) 70 - 99 mg/dL  Glucose, capillary     Status: Abnormal   Collection Time: 01/11/15 11:58 PM  Result Value Ref Range   Glucose-Capillary 133 (H) 70 - 99 mg/dL  Glucose, capillary     Status: Abnormal   Collection Time: 01/12/15  3:34 AM  Result Value Ref Range   Glucose-Capillary 107 (H) 70 - 99 mg/dL  Glucose, capillary     Status: Abnormal   Collection Time: 01/12/15  7:43 AM  Result Value Ref Range   Glucose-Capillary 114 (H) 70 - 99 mg/dL    Radiology/Results: No results found.  Anti-infectives: Anti-infectives    Start     Dose/Rate Route Frequency Ordered Stop   01/08/15 1115  piperacillin-tazobactam (ZOSYN) IVPB 3.375 g     3.375 g 100 mL/hr over 30 Minutes Intravenous  Once 01/08/15 1049 01/08/15 1111   01/08/15 0904  cefoTEtan (CEFOTAN) 2 g in dextrose 5 % 50 mL IVPB  Status:  Discontinued  2 g 100 mL/hr over 30 Minutes Intravenous On call to O.R. 01/08/15 0904 01/08/15 1049   01/08/15 0904  neomycin (MYCIFRADIN) tablet 1,000 mg  Status:  Discontinued     1,000 mg Oral 3 times per day 01/08/15 0904 01/08/15 0913   01/08/15 0904  erythromycin (E-MYCIN) tablet 1,000 mg  Status:  Discontinued     1,000 mg Oral 3 times per day 01/08/15 2035 01/08/15 0913      Assessment/Plan: Problem List: Patient Active Problem List   Diagnosis Date Noted  . S/P partial colectomy 01/08/2015  . 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 Colitis 01/28/2014  . Anemia 01/18/2014  . Nonspecific abnormal unspecified cardiovascular function study 10/31/2013  . Rectal bleeding 10/31/2013  . Hemorrhoids 10/31/2013  . CAD (coronary artery disease)   . Carcinoma in situ in a polyp    . Depression 09/17/2013  . Charcot's joint of right foot S/P Reconstruction 08/21/2013  . Osteoarthritis 08/21/2013  . Foot osteomyelitis, right 07/16/2013  . Extrinsic asthma, unspecified 07/16/2013  . GERD (gastroesophageal reflux disease)   . Neuropathy   . Hyperthyroidism   . Lower extremity edema 03/08/2011  . Annual physical exam 03/08/2011  . ALLERGIC RHINITIS 04/15/2010  . ORTHOSTATIC DIZZINESS 04/13/2010  . HIATAL HERNIA 12/06/2009  . GASTRIC ULCER, HX OF 12/06/2009  . DYSPNEA 03/18/2009  . UTI'S, RECURRENT 07/07/2008  . DEGENERATIVE JOINT DISEASE 06/03/2008  . COLONIC POLYPS 01/27/2008  . DIVERTICULOSIS, COLON 01/27/2008  . IRRITABLE BOWEL SYNDROME, HX OF 01/27/2008  . PULMONARY NODULE 01/22/2008  . HYPERLIPIDEMIA 12/30/2007  . Osteoporosis 12/30/2007  . TB SKIN TEST, POSITIVE 08/09/2007  . DM II (diabetes mellitus, type II), controlled 05/09/2007  . Reflex sympathetic dystrophy-- pain mngmt  05/09/2007  . Essential hypertension 04/15/2007  . GERD 04/15/2007    Path negative for cancer.  Plan discharge tomorrow 4 Days Post-Op    LOS: 4 days   Matt B. Hassell Done, MD, [facility] [phone] beeper [phone]  01/12/2015 10:59 AM

## 2015-01-13 LAB — GLUCOSE, CAPILLARY
Glucose-Capillary: 116 mg/dL — ABNORMAL HIGH (ref 70–99)
Glucose-Capillary: 133 mg/dL — ABNORMAL HIGH (ref 70–99)
Glucose-Capillary: 167 mg/dL — ABNORMAL HIGH (ref 70–99)

## 2015-01-13 MED ORDER — OXYCODONE-ACETAMINOPHEN 5-325 MG PO TABS: 1.0000 | ORAL_TABLET | ORAL | Status: DC | PRN

## 2015-01-13 NOTE — Progress Notes (Signed)
[name] to be D/C'd Home per MD order.  Discussed with the patient and all questions fully answered.  VSS, Skin clean, dry and intact without evidence of skin break down, no evidence of skin tears noted. IV catheter discontinued intact. Site without signs and symptoms of complications. Dressing and pressure applied.  An After Visit Summary was printed and given to the patient. Patient received prescription.  D/c education completed with patient/family including follow up instructions, medication list, d/c activities limitations if indicated, with other d/c instructions as indicated by MD - patient able to verbalize understanding, all questions fully answered.   Patient instructed to return to ED, call 911, or call MD for any changes in condition.   Patient escorted via [place], and D/C home via private auto.  Xaviar Lunn D 01/13/2015 4:03 PM

## 2015-01-13 NOTE — Discharge Summary (Signed)
Physician Discharge Summary  Patient ID: [name] MRN: [id number] DOB/AGE: 02-18-1935 80 y.o.  Admit date: 01/08/2015 Discharge date: 01/13/2015  Admission Diagnoses:  Dysplasia in right colon polyps  Discharge Diagnoses:  same  Principal Problem:   S/P partial colectomy   Surgery:  Lap assisted right hemicolectomy  Discharged Condition: improved;  Taking diet  Hospital Course:   Had surgery with small midline extraction site.  Incisions healing OK.  Tolerated diet.  Ready for discharge.  Consults: none  Significant Diagnostic Studies: path showed no cancer in the right colon polyps    Discharge Exam: Blood pressure 141/71, pulse 70, temperature 98.7 F (37.1 C), temperature source Oral, resp. rate 16, height 5\' 3"  (1.6 m), weight 90.266 kg (199 lb), SpO2 100 %. Incisions healing OK  Disposition: 01-Home or Self Care  Discharge Instructions    Call MD for:  redness, tenderness, or signs of infection (pain, swelling, redness, odor or green/yellow discharge around incision site)    Complete by:  As directed      Call MD for:  temperature >100.4    Complete by:  As directed      Diet - low sodium heart healthy    Complete by:  As directed      Discharge instructions    Complete by:  As directed   May shower and shampoo as needed.   Leave steristrips in place     Increase activity slowly    Complete by:  As directed      No wound care    Complete by:  As directed             Medication List    STOP taking these medications        sulfamethoxazole-trimethoprim 800-160 MG per tablet  Commonly known as:  SEPTRA DS      TAKE these medications        acetaminophen 500 MG tablet  Commonly known as:  TYLENOL  Take 500 mg by mouth daily as needed for mild pain or moderate pain.     Alpha-Lipoic Acid 600 MG Caps  Take 600 mg by mouth 2 (two) times daily.     amLODipine-benazepril 5-10 MG per capsule  Commonly known as:  LOTREL  take 1 capsule by mouth once  daily     aspirin EC 81 MG tablet  Take 1 tablet (81 mg total) by mouth daily.     Boron 3 MG Caps  Take 3 mg by mouth every evening.     clobetasol cream 0.05 %  Commonly known as:  TEMOVATE  Apply 1 application topically 2 (two) times daily.     diclofenac sodium 1 % Gel  Commonly known as:  VOLTAREN  Apply 1 application topically daily as needed (pain).     diphenhydrAMINE 25 MG tablet  Commonly known as:  BENADRYL  Take 25 mg by mouth daily as needed for allergies.     freestyle lancets  USE TWICE A DAY     FREESTYLE LITE test strip  Generic drug:  glucose blood  CHECK BLOOD SUGAR TWICE A DAY     HYDROcodone-acetaminophen 10-325 MG per tablet  Commonly known as:  NORCO  Take 1 tablet by mouth every 6 (six) hours as needed for moderate pain or severe pain.     hyoscyamine 0.125 MG SL tablet  Commonly known as:  LEVSIN/SL  Place 1 tablet (0.125 mg total) under the tongue every 4 (four) hours as needed.  Krill Oil 300 MG Caps  Take 300 mg by mouth every evening.     meclizine 12.5 MG tablet  Commonly known as:  ANTIVERT  take 1 tablet by mouth three times a day if needed     metoprolol succinate 25 MG 24 hr tablet  Commonly known as:  TOPROL-XL  take 1 tablet by mouth once daily     nabumetone 750 MG tablet  Commonly known as:  RELAFEN  Take 750 mg by mouth 2 (two) times daily.     nitroGLYCERIN 0.4 MG SL tablet  Commonly known as:  NITROSTAT  Place 1 tablet (0.4 mg total) under the tongue every 5 (five) minutes as needed for chest pain.     nortriptyline 10 MG capsule  Commonly known as:  PAMELOR  Take 10 mg by mouth every evening.     omeprazole 20 MG capsule  Commonly known as:  PRILOSEC  Take 1 capsule (20 mg total) by mouth 2 (two) times daily.     OVER THE COUNTER MEDICATION  Take 1 capsule by mouth 2 (two) times daily. Integrative Digestive Formula     oxyCODONE-acetaminophen 5-325 MG per tablet  Commonly known as:  PERCOCET/ROXICET  Take  1 tablet by mouth every 4 (four) hours as needed for moderate pain.     polyethylene glycol powder powder  Commonly known as:  GLYCOLAX/MIRALAX  - Take 17 g by mouth daily. Until daily soft stools  -   - OTC     PRESERVISION AREDS 2 PO  Take 1 tablet by mouth 2 (two) times daily.     PROBIOTIC DAILY PO  Take 1 capsule by mouth every evening.     pseudoephedrine 60 MG tablet  Commonly known as:  SUDAFED  Take 60 mg by mouth every 6 (six) hours as needed for congestion.     ranitidine 150 MG tablet  Commonly known as:  ZANTAC  Take 150 mg by mouth every evening.     REFRESH OP  Place 1 drop into both eyes daily as needed (dry eyes).     tiZANidine 4 MG tablet  Commonly known as:  ZANAFLEX  Take 4 mg by mouth every 8 (eight) hours as needed for muscle spasms.     trimethoprim 100 MG tablet  Commonly known as:  TRIMPEX  Take 100 mg by mouth every other day.     Vitamin B-12 2500 MCG Subl  Place 2,500 mcg under the tongue every evening.     Vitamin D3 5000 UNITS Caps  Take 5,000 Units by mouth every evening.           Follow-up Information    Follow up with Johnathan Hausen B, MD In 4 weeks.   Specialty:  General Surgery   Contact information:   [place] [address] [place] [address] [phone]       Signed: Pedro Earls 01/13/2015, 8:23 AM

## 2015-01-13 NOTE — Discharge Instructions (Signed)
Laparoscopic Colectomy Laparoscopic colectomy is surgery to remove part or all of the large intestine (colon). This procedure is used to treat several conditions, including:  Inflammation and infection of the colon (diverticulitis).  Tumors or masses in the colon.  Inflammatory bowel disease, such as Crohn disease or ulcerative colitis. Colectomy is an option when symptoms cannot be controlled with medicines.  Bleeding from the colon that cannot be controlled by another method.  Blockage or obstruction of the colon. LET [facility] CARE PROVIDER KNOW ABOUT:  Any allergies you have.  All medicines you are taking, including vitamins, herbs, eye drops, creams, and over-the-counter medicines.  Previous problems you or members of your family have had with the use of anesthetics.  Any blood disorders you have.  Previous surgeries you have had.  Medical conditions you have. RISKS AND COMPLICATIONS Generally, this is a safe procedure. However, as with any procedure, complications can occur. Possible complications include:  Infection.  Bleeding.  Damage to other organs.  Leaking from where the colon was sewn together.  Future blockage of the small intestines from scar tissue. Another surgery may be needed to repair this. In some cases, complications such as damage to other organs or excessive bleeding may require the surgeon to convert from a laparoscopic procedure to an open procedure. This involves making a larger incision in the abdomen to perform the procedure. BEFORE THE PROCEDURE  Ask your health care provider about changing or stopping any regular medicines.  You may be prescribed an oral bowel prep. This involves drinking a large amount of medicated liquid, starting the day before your surgery. The liquid will cause you to have multiple loose stools until your stool is almost clear or light green. This cleans out your colon in preparation for the surgery.  Do not eat or  drink anything else once you have started the bowel prep, unless your health care provider tells you it is safe to do so.  You may also be given antibiotic pills to clean out your colon of bacteria. Be sure to follow the directions carefully and take the medicine at the correct time. PROCEDURE   Small monitors will be put on your body. They are used to check your heart, blood pressure, and oxygen level.  An IV access tube will be put into one of your veins. Medicine will be able to flow directly into your body through this IV tube.  You might be given a medicine to help you relax (sedative).  You will be given a medicine to make you sleep through the procedure (general anesthetic). A breathing tube may be placed into your lungs during the procedure.  A thin, flexible tube (catheter) will be placed into your bladder to collect urine.  A tube may be put in through your nose. It is called a nasogastric tube. It is used to remove stomach fluids after surgery until the intestines start working again.  Your abdomen will be filled with air so that it expands. This gives the surgeon more room to operate and makes your organs easier to see.  Several small cuts (incisions) are made in your abdomen.  A thin, lighted tube with a tiny camera on the end (laparoscope) is put through one of the small incisions. The camera on the laparoscope sends a picture to a TV screen in the operating room. This gives the surgeon a good view inside your abdomen.  Hollow tubes are put through the other small incisions in your abdomen. The tools  needed for the procedure are put through these tubes.  Clamps or staples are put on both ends of the diseased part of the colon.  The part of the intestine between the clamps or staples is removed.  If possible, the ends of the healthy colon that remain will be stitched or stapled together to allow your body to expel waste (stool).  Sometimes, the remaining colon cannot be  stitched back together. If this is the case, a colostomy is needed. For a colostomy:  An opening (stoma) to the outside of your body is made through the abdomen.  The end of the colon is brought to the opening. It is stitched to the skin.  A bag is attached to the opening. Stool will drain into this bag. The bag is removable.  The colostomy can be temporary or permanent.  The incisions from the colectomy are closed with stitches or staples. AFTER THE PROCEDURE  You will be monitored closely in a recovery area until you are stable and doing well. You will then be moved to a regular hospital room.  You will need to receive fluids through an IV tube until your bowel function has returned. This may take 1-3 days. Once your bowels are working again, you will be started on clear liquids and then advanced to solid food as tolerated.  You will be given pain medicines to control your pain. Document Released: 12/30/2002 Document Revised: 07/30/2013 Document Reviewed: 05/21/2013 [facility] Patient Information 2015 [place], [place]. This information is not intended to replace advice given to you by your health care provider. Make sure you discuss any questions you have with your health care provider.

## 2015-01-29 LAB — HEMOGLOBIN A1C
Hgb A1c MFr Bld: 7.1 %
Mean Plasma Glucose: 157

## 2015-02-01 ENCOUNTER — Other Ambulatory Visit: Payer: Self-pay

## 2015-02-03 ENCOUNTER — Telehealth: Payer: Self-pay | Admitting: Internal Medicine

## 2015-02-03 NOTE — Telephone Encounter (Signed)
Caller name: Max[place], [place] Relation to pt: self Call back number:  [phone]   Reason for call:  Pt requesting  2 RX for  HYDROcodone-acetaminophen (NORCO) 10-325 MG per tablet and would like to pick up Friday. Please advise

## 2015-02-04 MED ORDER — HYDROCODONE-ACETAMINOPHEN 10-325 MG PO TABS: 1.0000 | ORAL_TABLET | Freq: Four times a day (QID) | ORAL | Status: DC | PRN

## 2015-02-04 NOTE — Telephone Encounter (Signed)
Both Rx's placed at front desk for pick up at her convenience. Can you let Pt know? Thank you.

## 2015-02-04 NOTE — Telephone Encounter (Signed)
Ok x 2  

## 2015-02-04 NOTE — Telephone Encounter (Signed)
Pt is requesting refill on Hydrocodone. 2 Rx's   Last OV: 10/06/2014 Last Fill: 12/07/2014 #120 0RF UDS: 06/04/2014 Low risk  Please advise.

## 2015-02-04 NOTE — Telephone Encounter (Signed)
Hydrocodone prescription printed x2. Awaiting MD signature.

## 2015-02-05 ENCOUNTER — Ambulatory Visit: Payer: Medicare Other | Admitting: Internal Medicine

## 2015-02-05 NOTE — Telephone Encounter (Signed)
Patient called in regarding this and informed her that prescriptions are ready for pickup.

## 2015-02-08 ENCOUNTER — Telehealth: Payer: Self-pay | Admitting: *Deleted

## 2015-02-08 MED ORDER — OMEPRAZOLE 20 MG PO CPDR: 20.0000 mg | DELAYED_RELEASE_CAPSULE | Freq: Two times a day (BID) | ORAL | Status: DC

## 2015-02-08 NOTE — Telephone Encounter (Signed)
Sent Rx for omeprazole, 20 mg, #180 with one refill to [address] in [place], [place] on 02/08/15.

## 2015-02-25 ENCOUNTER — Ambulatory Visit: Payer: Medicare Other | Admitting: Cardiology

## 2015-03-09 DIAGNOSIS — M545 Low back pain: Secondary | ICD-10-CM | POA: Diagnosis not present

## 2015-03-09 DIAGNOSIS — M5136 Other intervertebral disc degeneration, lumbar region: Secondary | ICD-10-CM | POA: Diagnosis not present

## 2015-03-09 DIAGNOSIS — M4806 Spinal stenosis, lumbar region: Secondary | ICD-10-CM | POA: Diagnosis not present

## 2015-03-15 ENCOUNTER — Other Ambulatory Visit: Payer: Self-pay | Admitting: Internal Medicine

## 2015-03-15 NOTE — Telephone Encounter (Signed)
Pt is requesting refill on Antivert.   Last OV: 10/06/2014 Last Fill: 04/13/2014 # 68 1RF  Please advise.

## 2015-03-15 NOTE — Telephone Encounter (Signed)
Ok 90 and 1

## 2015-03-16 NOTE — Telephone Encounter (Signed)
Rx sent to [facility] pharmacy

## 2015-03-23 DIAGNOSIS — M5136 Other intervertebral disc degeneration, lumbar region: Secondary | ICD-10-CM | POA: Diagnosis not present

## 2015-03-24 DIAGNOSIS — B372 Candidiasis of skin and nail: Secondary | ICD-10-CM | POA: Diagnosis not present

## 2015-03-24 DIAGNOSIS — L821 Other seborrheic keratosis: Secondary | ICD-10-CM | POA: Diagnosis not present

## 2015-03-29 ENCOUNTER — Other Ambulatory Visit: Payer: Self-pay | Admitting: Internal Medicine

## 2015-03-29 ENCOUNTER — Other Ambulatory Visit: Payer: Self-pay | Admitting: Cardiology

## 2015-03-31 ENCOUNTER — Ambulatory Visit: Payer: Medicare Other | Admitting: Cardiology

## 2015-04-02 ENCOUNTER — Ambulatory Visit (INDEPENDENT_AMBULATORY_CARE_PROVIDER_SITE_OTHER): Payer: Medicare Other | Admitting: Cardiology

## 2015-04-02 ENCOUNTER — Encounter: Payer: Self-pay | Admitting: Cardiology

## 2015-04-02 VITALS — BP 122/82 | HR 97 | Ht 63.0 in | Wt 205.0 lb

## 2015-04-02 DIAGNOSIS — I10 Essential (primary) hypertension: Secondary | ICD-10-CM | POA: Diagnosis not present

## 2015-04-02 DIAGNOSIS — I2510 Atherosclerotic heart disease of native coronary artery without angina pectoris: Secondary | ICD-10-CM | POA: Diagnosis not present

## 2015-04-02 DIAGNOSIS — E785 Hyperlipidemia, unspecified: Secondary | ICD-10-CM | POA: Diagnosis not present

## 2015-04-02 NOTE — Patient Instructions (Signed)
Medication Instructions:  None  Labwork: None  Testing/Procedures: None  Follow-Up: Your physician wants you to follow-up in: 6 months with Dr. Meda Coffee. You will receive a reminder letter in the mail two months in advance. If you don't receive a letter, please call our office to schedule the follow-up appointment.   Any Other Special Instructions Will Be Listed Below (If Applicable).

## 2015-04-02 NOTE — Progress Notes (Signed)
Patient ID: CASIDY ALBERTA, female   DOB: 24-Dec-1934, 80 y.o.   MRN: [id number]     Date:  04/02/2015   ID:  KARON COTTERILL, DOB [birth year], MRN [id number]  PCP:  [name], MD  Cardiologist:  Dr. Thompson Grayer => Dr. Ena Dawley     History of Present Illness: [name] is a 80 y.o. female with a hx of HTN, HL, T2DM, orthostatic hypotension, prior DVT after neck surgery in 2010, s/p IVC filter, GERD and reflex sympathetic dystrophy.    She had an extensive workup for dyspnea in the past.  Please see my last office visit note from 10/29/2013 for complete details.  Patient was evaluated by Dr. Rayann Heman in January for chest pain and shortness of breath.  Myoview was abnormal and cardiac catheterization was recommended. Patient was admitted 1/9-1/10. Cardiac catheterization demonstrated high-grade disease in the second diagonal which was treated with cutting balloon angioplasty.  Patient was to have GI evaluation in the near future (EGD and colonoscopy).  She had a recent history of dysphagia as well as rectal bleeding with prior history of adenomatous polyps and in situ carcinoma.  Therefore, long term commitment to dual antiplatelet Rx was to be avoided.    She has noted recurrent left-sided chest discomfort. She feels as though that this is indigestion. She has had this for years without significant change. She is fairly sedentary. She cannot tell me if she has had significant improvement with any symptoms since her angioplasty. She denies associated dyspnea. She denies exertional chest discomfort. She does take Gas-X for her chest discomfort with relief.  Overall, she describes NYHA class 2-2b symptoms.  She denies syncope. She denies significant pedal edema.  She is requesting clearance to undergo epidural steroid injections as well as EGD/colonoscopy.  Echocardiogram (10/14/13): Mild LVH, mild focal basal hypertrophy of the septum, EF 60-65%, normal wall motion, grade 1 diastolic dysfunction, MAC,  mild LAE, atrial septal aneurysm, PASP 34. There was a possible oscillating density on the mitral valve on parasternal long axis views (related to MAC).   LexiScan Myoview (10/20/13): Intermediate risk; medium-size reversible defect in the mid LAD territory, EF 68%. LHC (10/31/13): Proximal D2 95, mid circumflex 40, LVEDP 10.  PCI:  Cutting Balloon angioplasty to D2.  The patient has a long history of multiple polyps in her cecum and colon, with history of a malignant polyp many years ago. She just underwent a colonoscopy and EGD with finding of 3 polyps with removal of 2 and partial removal of a large 1. Biopsy showed tubular adenoma and she has a scheduled followup appointment with Dr. Maurene Capes to discuss further management.  She otherwise reports stable chest pain and back pain that's not related to exertion and is relieved by antacids. She denies any shortness of breath, palpitation or syncope.  06/16/2014 - this is a 3 months followup, in the interim the patient underwent EGD and colonoscopy with removal of multiple small and 1 large polyp that was associated with prolonged abdominal pain. The patient states that she didn't have bleeding but is persuaded that the pain resolved with Bactrim. She has been seeing Dr. Maurene Capes for this. She has noticed also on and off chest pain and back pain pretty much similar to the symptoms she had prior she received her stent to diagonal artery a year ago. She has stopped taking Plavix prior to her colonoscopy and hasn't restarted yet. She has stable exertional dyspnea no palpitations or syncope.  04/02/15 -  the patient is coming after 6 months, she has underwent a partial colectomy for malignant polyp, she is recovering but feels tired and has some worsening SOB. No chest pain, no palpitations or syncope. No orthostatic hypotension. She complains that periodically her lips turns blue.    Recent Labs: 06/08/2014: ALT 12 01/09/2015: Creatinine, Ser 0.69; Hemoglobin 12.0;  Potassium 3.8  Wt Readings from Last 3 Encounters:  04/02/15 205 lb (92.987 kg)  01/08/15 199 lb (90.266 kg)  12/31/14 199 lb (90.266 kg)     Past Medical History  Diagnosis Date  . Hyperlipidemia     a. patient unwilling to use statins.  . Osteoporosis   . DVT (deep venous thrombosis) 12/2007  . PE (pulmonary embolism) 12/2007    a. PE/DVT after neck surgery 2009. b. coumadin d/c 10-2008.  . Pulmonary nodule     incidental per CT:  Pet scan 4-9: likely benign, CT 08-2009 no change, no further CTs (Dr. Gwenette Greet)  . Hemorrhoids   . IBS (irritable bowel syndrome)   . Diverticulosis   . PPD positive   . Macular degeneration 03/2009    Dr. Rosana Hoes  . Allergic rhinitis   . Dyspnea     a. Chronic, extensive w/u see OV note 09-2010. b. [place] 2011: ormal with RA 5 RV 31/2 PA 27/12 (19) PCW 10 CO normal. No evidence of shunting with sitting up in cath lab. c. CPX 2011: see report. d. Prior fluoro of diaphragm -  R diaphragm elevated at rest but both moved with inspiration.   Marland Kitchen Spinal stenosis   . GERD (gastroesophageal reflux disease)     a. Hx GERD/esophageal dysmotility followed by Dr. Olevia Perches.   Marland Kitchen Hypertension   . Neuropathy     a. Hands, feet, legs.  . Orthostatic hypotension   . CAD (coronary artery disease)     a. Nonobst in 2011. b. Abnormal nuc 09/2013 -> s/p cutting balloon to D2; mild LAD disease 10/31/13.  . Osteomyelitis     a. Adm 04/2013: Charcot collapse of the right foot with osteomyelitis and ulceration, s/p excision.  . Venous insufficiency     a. Contributing to LEE.  . Type II diabetes mellitus   . DJD (degenerative joint disease)   . Arthritis     "back; ankles; hands; knees" (10/31/2013)  . Recurrent UTI   . 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 1994 - malignant polyp removed during colonoscopy.  . Hiatal hernia     a. s/p Nissen fundoplication 4259.  Marland Kitchen RSD (reflex sympathetic dystrophy)     a. Chronic pain.  . Abnormal echocardiogram     09/2013: mild LVH, mild  focal basal hypertrophy of septum, EF 60-65%, normal WM, grade 1 diastolic dysfunction, MAC, mild LAE, ASA, PASP 34. Possible oscillating MV density - reviewed by MD and felt there was no significant abnormality other than MAC noted with mitral valve and did not require TEE.  . Adenomatous colon polyp   . Pneumonia   . Fatty liver   . PONV (postoperative nausea and vomiting) 2009    neck surgery    Current Outpatient Prescriptions  Medication Sig Dispense Refill  . acetaminophen (TYLENOL) 500 MG tablet Take 500 mg by mouth daily as needed for mild pain or moderate pain.     . Alpha-Lipoic Acid 600 MG CAPS Take 600 mg by mouth 2 (two) times daily.    Marland Kitchen amLODipine-benazepril (LOTREL) 5-10 MG per capsule Take 1 capsule by  mouth daily. 30 capsule 1  . aspirin EC 81 MG tablet Take 1 tablet (81 mg total) by mouth daily. (Patient taking differently: Take 81 mg by mouth at bedtime. )    . Boron 3 MG CAPS Take 3 mg by mouth every evening.     . Cholecalciferol (VITAMIN D3) 5000 UNITS CAPS Take 5,000 Units by mouth every evening.    . ciclopirox (LOPROX) 0.77 % cream Apply 1 application on the skin 2 times daily to rash as needed  0  . clobetasol cream (TEMOVATE) 7.03 % Apply 1 application topically 2 (two) times daily.    . Cyanocobalamin (VITAMIN B-12) 2500 MCG SUBL Place 2,500 mcg under the tongue every evening.     . diclofenac sodium (VOLTAREN) 1 % GEL Apply 1 application topically daily as needed (pain).    Marland Kitchen diphenhydrAMINE (BENADRYL) 25 MG tablet Take 25 mg by mouth daily as needed for allergies.    . fluconazole (DIFLUCAN) 150 MG tablet Take 150 mg by mouth See admin instructions. Take 150 mg by mouth as directed  0  . FREESTYLE LITE test strip CHECK BLOOD SUGAR TWICE A DAY (Patient taking differently: CHECK BLOOD SUGAR once daily.) 100 each 10  . HYDROcodone-acetaminophen (NORCO) 10-325 MG per tablet Take 1 tablet by mouth every 6 (six) hours as needed for moderate pain or severe pain. 120  tablet 0  . hyoscyamine (LEVSIN/SL) 0.125 MG SL tablet Place 1 tablet (0.125 mg total) under the tongue every 4 (four) hours as needed. (Patient taking differently: Place 0.125 mg under the tongue every 4 (four) hours as needed for cramping. ) 30 tablet 1  . Krill Oil 300 MG CAPS Take 300 mg by mouth every evening.     . Lancets (FREESTYLE) lancets USE TWICE A DAY (Patient taking differently: Use once daily to test blood sugar.) 100 each 10  . meclizine (ANTIVERT) 12.5 MG tablet Take 1 tablet (12.5 mg total) by mouth 3 (three) times daily. 90 tablet 1  . metoprolol succinate (TOPROL-XL) 25 MG 24 hr tablet take 1 tablet by mouth once daily 30 tablet 1  . Multiple Vitamins-Minerals (PRESERVISION AREDS 2 PO) Take 1 tablet by mouth 2 (two) times daily.     . nabumetone (RELAFEN) 750 MG tablet Take 750 mg by mouth 2 (two) times daily.    . nitroGLYCERIN (NITROSTAT) 0.4 MG SL tablet Place 1 tablet (0.4 mg total) under the tongue every 5 (five) minutes as needed for chest pain. 90 tablet 3  . nortriptyline (PAMELOR) 10 MG capsule Take 10 mg by mouth every evening.     Marland Kitchen omeprazole (PRILOSEC) 20 MG capsule Take 1 capsule (20 mg total) by mouth 2 (two) times daily. 180 capsule 1  . OVER THE COUNTER MEDICATION Take 1 capsule by mouth 2 (two) times daily. Integrative Digestive Formula    . oxyCODONE-acetaminophen (PERCOCET/ROXICET) 5-325 MG per tablet Take 1 tablet by mouth every 4 (four) hours as needed for moderate pain. 30 tablet 0  . polyethylene glycol powder (GLYCOLAX/MIRALAX) powder Take 17 g by mouth daily. Until daily soft stools  OTC 255 g 0  . Polyvinyl Alcohol-Povidone (REFRESH OP) Place 1 drop into both eyes daily as needed (dry eyes).    . Probiotic Product (PROBIOTIC DAILY PO) Take 1 capsule by mouth every evening.     . pseudoephedrine (SUDAFED) 60 MG tablet Take 60 mg by mouth every 6 (six) hours as needed for congestion.    . ranitidine (ZANTAC) 150 MG  tablet Take 150 mg by mouth every  evening.    Marland Kitchen tiZANidine (ZANAFLEX) 4 MG tablet Take 4 mg by mouth every 8 (eight) hours as needed for muscle spasms.    Marland Kitchen trimethoprim (TRIMPEX) 100 MG tablet Take 100 mg by mouth every other day.     No current facility-administered medications for this visit.    Allergies:   Cymbalta; Gabapentin; Adhesive; Cefazolin; Ciprofloxacin; Clorazepate dipotassium; Darifenacin hydrobromide; Dilaudid; Doxycycline; Enablex; Levofloxacin; Lyrica; Methadone hcl; Morphine and related; Pentazocine lactate; and Talwin   Social History:  The patient  reports that she has never smoked. She has never used smokeless tobacco. She reports that she does not drink alcohol or use illicit drugs.   Family History:  The patient's family history includes Breast cancer in an other family member; Diabetic kidney disease in her daughter; Headache in her sister; Heart disease in her mother; Hypertension in an other family member; Migraines in her sister; Rheum arthritis in her mother. There is no history of Colon cancer, Esophageal cancer, Stomach cancer, or Rectal cancer.   ROS:  Please see the history of present illness.   All other systems reviewed and negative.   PHYSICAL EXAM: VS:  BP 122/82 mmHg  Pulse 97  Ht 5\' 3"  (1.6 m)  Wt 205 lb (92.987 kg)  BMI 36.32 kg/m2 Well nourished, well developed, in no acute distress HEENT: normal Neck: no JVD at 90 Cardiac:  normal S1, S2; RRR; no murmur Lungs:  clear to auscultation bilaterally, no wheezing, rhonchi or rales Abd: soft, nontender, no hepatomegaly Ext: trace bilateral LE edema right wrist without hematoma or mass  Skin: warm and dry Neuro:  CNs 2-12 intact, no focal abnormalities noted  EKG:  NSR, HR 74, normal axis, nonspecific ST-T wave changes   ASSESSMENT AND PLAN:  1. Chest Pain:  Negative stress test 6 months ago, she appears deconditioned post surgery and is advised to start light daily exercises. She will continue aspirin and beta blocker. she is  adamant about not taking statins. She stopped taking Plavix in 09/2014 prior to receiving spinal shots.  2. Hypertension:  Controlled. 3. Hyperlipidemia - she is adamant about not taking statins. 4. Disposition:  F/u  In 6 months.    Signed, Dorothy Spark, MD  04/02/2015 3:07 PM

## 2015-04-14 ENCOUNTER — Telehealth: Payer: Self-pay | Admitting: Internal Medicine

## 2015-04-14 DIAGNOSIS — M47816 Spondylosis without myelopathy or radiculopathy, lumbar region: Secondary | ICD-10-CM | POA: Diagnosis not present

## 2015-04-14 DIAGNOSIS — M5416 Radiculopathy, lumbar region: Secondary | ICD-10-CM | POA: Diagnosis not present

## 2015-04-14 DIAGNOSIS — M4312 Spondylolisthesis, cervical region: Secondary | ICD-10-CM | POA: Diagnosis not present

## 2015-04-14 DIAGNOSIS — M542 Cervicalgia: Secondary | ICD-10-CM | POA: Diagnosis not present

## 2015-04-14 DIAGNOSIS — Z6833 Body mass index (BMI) 33.0-33.9, adult: Secondary | ICD-10-CM | POA: Diagnosis not present

## 2015-04-14 DIAGNOSIS — M5136 Other intervertebral disc degeneration, lumbar region: Secondary | ICD-10-CM | POA: Diagnosis not present

## 2015-04-14 MED ORDER — HYDROCODONE-ACETAMINOPHEN 10-325 MG PO TABS: 1.0000 | ORAL_TABLET | Freq: Four times a day (QID) | ORAL | Status: DC | PRN

## 2015-04-14 NOTE — Telephone Encounter (Signed)
Rx's for June and July 2016 printed, awaiting MD signature.

## 2015-04-14 NOTE — Telephone Encounter (Signed)
Caller name: Aileen Amore Relationship to patient: self Can be reached: [phone] Pharmacy:  Reason for call: Pt called for refill on Hydrocodone. She has 4 days left. Takes 3/day. States that he writes 2 months RX. Last appt 01/2015. Pt grandson, Liz Malady, will pick up RX when ready. Please call when RX is ready.

## 2015-04-14 NOTE — Telephone Encounter (Signed)
Okay to print 2 prescriptions

## 2015-04-14 NOTE — Telephone Encounter (Signed)
Rx placed at front desk for pick up at Pt's convenience.

## 2015-04-14 NOTE — Telephone Encounter (Signed)
Pt is requesting refill on Hydrocodone. Requesting 2 Rx's  Last OV: 10/06/2014 Last Fill: 02/04/2015 #[id number] 0RF For April and May 2016 UDS: 06/04/2014 Low risk  Please advise.

## 2015-05-13 DIAGNOSIS — M47816 Spondylosis without myelopathy or radiculopathy, lumbar region: Secondary | ICD-10-CM | POA: Diagnosis not present

## 2015-05-13 DIAGNOSIS — M545 Low back pain: Secondary | ICD-10-CM | POA: Diagnosis not present

## 2015-05-13 DIAGNOSIS — M4312 Spondylolisthesis, cervical region: Secondary | ICD-10-CM | POA: Diagnosis not present

## 2015-05-17 ENCOUNTER — Other Ambulatory Visit: Payer: Self-pay

## 2015-05-19 ENCOUNTER — Other Ambulatory Visit (INDEPENDENT_AMBULATORY_CARE_PROVIDER_SITE_OTHER): Payer: Medicare Other

## 2015-05-19 ENCOUNTER — Encounter: Payer: Self-pay | Admitting: Internal Medicine

## 2015-05-19 ENCOUNTER — Ambulatory Visit (INDEPENDENT_AMBULATORY_CARE_PROVIDER_SITE_OTHER): Payer: Medicare Other | Admitting: Internal Medicine

## 2015-05-19 VITALS — BP 126/78 | HR 88 | Ht 63.0 in | Wt 206.2 lb

## 2015-05-19 DIAGNOSIS — I2510 Atherosclerotic heart disease of native coronary artery without angina pectoris: Secondary | ICD-10-CM | POA: Diagnosis not present

## 2015-05-19 DIAGNOSIS — R1031 Right lower quadrant pain: Secondary | ICD-10-CM

## 2015-05-19 DIAGNOSIS — R197 Diarrhea, unspecified: Secondary | ICD-10-CM

## 2015-05-19 LAB — CREATININE, SERUM: Creatinine, Ser: 0.93 mg/dL (ref 0.40–1.20)

## 2015-05-19 LAB — BUN: BUN: 16 mg/dL (ref 6–23)

## 2015-05-19 NOTE — Progress Notes (Signed)
[name] [birth year] [id number]  Note: This dictation was prepared with Dragon digital system. Any transcriptional errors that result from this procedure are unintentional.   History of Present Illness: This is a 80 year old white female I have been following  for  30 years. She had a laparoscopically-assisted right hemicolectomy for carpeted polyp of the cecum. In March 2016 by Dr. Hassell Done. She is having  diarrhea and urgent bowel movements. She has other medical problems such as reflex sympathetic dystrophy hx ofNissen fundoplication for intractable gastroesophageal reflux, chronic low back pain for which she takes hydrocodone 3 times a day . She describes occasional constipation and inability to evacuate. She describes decreased caliber stools. Last colonoscopy was done in August 2015 at which time cecal polyp was partly removed but the base remained in the colon. It prompted her referral to surgery for resection. She already has a history of in situ carcinoma of the  sigmoid polyp in 1994 with numerous colonoscopies and polypectomies since then.    Past Medical History  Diagnosis Date  . Hyperlipidemia     a. patient unwilling to use statins.  . Osteoporosis   . DVT (deep venous thrombosis) 12/2007  . PE (pulmonary embolism) 12/2007    a. PE/DVT after neck surgery 2009. b. coumadin d/c 10-2008.  . Pulmonary nodule     incidental per CT:  Pet scan 4-9: likely benign, CT 08-2009 no change, no further CTs (Dr. Gwenette Greet)  . Hemorrhoids   . IBS (irritable bowel syndrome)   . Diverticulosis   . PPD positive   . Macular degeneration 03/2009    Dr. Rosana Hoes  . Allergic rhinitis   . Dyspnea     a. Chronic, extensive w/u see OV note 09-2010. b. [place] 2011: ormal with RA 5 RV 31/2 PA 27/12 (19) PCW 10 CO normal. No evidence of shunting with sitting up in cath lab. c. CPX 2011: see report. d. Prior fluoro of diaphragm -  R diaphragm elevated at rest but both moved with inspiration.   Marland Kitchen Spinal stenosis    . GERD (gastroesophageal reflux disease)     a. Hx GERD/esophageal dysmotility followed by Dr. Olevia Perches.   Marland Kitchen Hypertension   . Neuropathy     a. Hands, feet, legs.  . Orthostatic hypotension   . CAD (coronary artery disease)     a. Nonobst in 2011. b. Abnormal nuc 09/2013 -> s/p cutting balloon to D2; mild LAD disease 10/31/13.  . Osteomyelitis     a. Adm 04/2013: Charcot collapse of the right foot with osteomyelitis and ulceration, s/p excision.  . Venous insufficiency     a. Contributing to LEE.  . Type II diabetes mellitus   . DJD (degenerative joint disease)   . Arthritis     "back; ankles; hands; knees" (10/31/2013)  . Recurrent UTI   . 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 1994 - malignant polyp removed during colonoscopy.  . Hiatal hernia     a. s/p Nissen fundoplication 2355.  Marland Kitchen RSD (reflex sympathetic dystrophy)     a. Chronic pain.  . Abnormal echocardiogram     09/2013: mild LVH, mild focal basal hypertrophy of septum, EF 60-65%, normal WM, grade 1 diastolic dysfunction, MAC, mild LAE, ASA, PASP 34. Possible oscillating MV density - reviewed by MD and felt there was no significant abnormality other than MAC noted with mitral valve and did not require TEE.  . Adenomatous colon polyp   . Pneumonia   .  Fatty liver   . PONV (postoperative nausea and vomiting) 2009    neck surgery    Past Surgical History  Procedure Laterality Date  . Nissen fundoplication  02/27/3219  . Cholecystectomy  03/2002  . Cataract extraction w/ intraocular lens  implant, bilateral  2004    feb 2004 left, aug 2004 right  . Rotator cuff repair Right 07/2007    Dr. Percell Miller  . Anterior cervical decomp/discectomy fusion  12/18/07    For OA,  Dr. Lorin Mercy:  fu by a PE  . Toe amputation Right 08/2008    3rd toe, Dr. Sharol Given due to osteomyelitis  . Nasal septum surgery  09/1963  . Vein ligation Bilateral 03/1966  . Carpal tunnel release Left 09/2011  . Amputation  03/06/2012    Procedure: AMPUTATION FOOT;   Surgeon: Newt Minion, MD;  Location: [place];  Service: Orthopedics;  Laterality: Left;  FIFTH RAY AMPUTATION   . Vena cava filter placement  01/2010    green filter; "due to blood clots"  . Ankle fusion  09/27/2012    Procedure: ANKLE FUSION;  Surgeon: Newt Minion, MD;  Location: [place];  Service: Orthopedics;  Laterality: Left;  Left Tibiocalcaneal Fusion  . Ankle fusion Right 05/09/2013    Procedure: ANKLE FUSION;  Surgeon: Newt Minion, MD;  Location: [place];  Service: Orthopedics;  Laterality: Right;  Excision Osteomyelitis Base 1st MT Right Foot, Fusion Medial Column  . Cardiac catheterization  05/2010     at [facility]  . Coronary angioplasty  10/31/2013  . Vaginal hysterectomy  03/1975  . Ankle surgery Left apr & june 1991  . Cyst removal hand  06/2003  . Rotator cuff repair Left 11/2004  . Wisdom tooth extraction  06/2007  . Foot bone excision Right 06/2009  . Left heart catheterization with coronary angiogram N/A 10/31/2013    Procedure: LEFT HEART CATHETERIZATION WITH CORONARY ANGIOGRAM;  Surgeon: Jettie Booze, MD;  Location: [facility] CATH LAB;  Service: Cardiovascular;  Laterality: N/A;  . Percutaneous coronary intervention-balloon only  10/31/2013    Procedure: PERCUTANEOUS CORONARY INTERVENTION-BALLOON ONLY;  Surgeon: Jettie Booze, MD;  Location: [facility] CATH LAB;  Service: Cardiovascular;;  . Laparoscopic right hemi colectomy N/A 01/08/2015    Procedure: LAPAROSCOPIC ASSISTED RIGHT HEMI COLECTOMY;  Surgeon: Johnathan Hausen, MD;  Location: [facility];  Service: General;  Laterality: N/A;    Allergies  Allergen Reactions  . Cymbalta [Duloxetine Hcl] Swelling    Swelling in legs  . Gabapentin Swelling    Swelling in legs  . Adhesive [Tape] Rash    rash  . Cefazolin Hives    Hives   . Ciprofloxacin Other (See Comments)    Per pt, caused body aches   . Clorazepate Dipotassium Other (See Comments)    Unknown reaction  . Darifenacin Hydrobromide Other (See Comments)    hypotension,  near syncope  . Dilaudid [Hydromorphone Hcl] Other (See Comments)     confused, intense itching  . Doxycycline Rash  . Enablex [Darifenacin Hydrobromide Er] Other (See Comments)    Hypotension, near syncope  . Levofloxacin Other (See Comments)    Causes wrist pain  . Lyrica [Pregabalin] Swelling    Swelling in legs  . Methadone Hcl Other (See Comments)    Reaction unknown  . Morphine And Related Other (See Comments)    Confusion, constipation.   . Pentazocine Lactate Other (See Comments)    Altered mental state, "climbing walls", anxiety  . Talwin [Pentazocine]  Other (See Comments)    "climbing walls" anxiety    Family history and social history have been reviewed.  Review of Systems: Urgent stools. Constipation and back pain with diarrhea." Lower quadrant abdominal pain which was similar to prior to surgery chronic low back pain  The remainder of the 10 point ROS is negative except as outlined in the H&P  Physical Exam: General Appearance Well developed, in no distress Eyes  Non icteric  HEENT  Non traumatic, normocephalic  Mouth No lesion, tongue papillated, no cheilosis Neck Supple without adenopathy, thyroid not enlarged, no carotid bruits, no JVD Lungs Clear to auscultation bilaterally COR Normal S1, normal S2, regular rhythm, no murmur, quiet precordium Abdomen mild tenderness right lower quadrant. No palpable mass. No rebound. Well-healed surgical scar above the umbilicus Rectal soft heme occult negative stool Extremities  No pedal edema Skin No lesions Neurological Alert and oriented x 3 Psychological Normal mood and affect  Assessment and Plan:   80 year old white female with chronic right lower quadrant abdominal pain which was present prior to her right hemicolectomy 4 months ago. She has a history of irritable bowel syndrome with predominant constipation. She is now having alternating bowel diarrhea and constipation which could be due to choleretic diarrhea  or/and absence of ileocecal valve. She has been on Levsin sublingually 0.125 mg as well as probiotic for bacterial overgrowth. We will proceed with CT scan of the abdomen and pelvis with attention to right lower quadrant,. Since her pain was present prior to surgery and her pain is sometimes only in her back I wonder whether  She has a  radiculopathy of lower lumbar spine radiating to the right lower quadrantShe has been under the care of  a specialist.  GERD- under good control  Adenomatous polyps- recall colon  before age 89.    Delfin Edis 05/19/2015

## 2015-05-19 NOTE — Patient Instructions (Addendum)
You have been scheduled for a CT scan of the abdomen and pelvis at [facility] ([address]---this is in the same building as Press [facility]).   You are scheduled on Friday, 05/21/15 at 10:30 am. You should arrive 15 minutes prior to your appointment time for registration. Please follow the written instructions below on the day of your exam:  WARNING: IF YOU ARE ALLERGIC TO IODINE/X-RAY DYE, PLEASE NOTIFY RADIOLOGY IMMEDIATELY AT [phone]! YOU WILL BE GIVEN A 13 HOUR PREMEDICATION PREP.  1) Do not eat or drink anything after 6:30 am (4 hours prior to your test) 2) You have been given 2 bottles of oral contrast to drink. The solution may taste better if refrigerated, but do NOT add ice or any other liquid to this solution. Shake well before drinking.    Drink 1 bottle of contrast @ 8:30 am (2 hours prior to your exam)  Drink 1 bottle of contrast @ 9:30 am (1 hour prior to your exam)  You may take any medications as prescribed with a small amount of water except for the following: Metformin, Glucophage, Glucovance, Avandamet, Riomet, Fortamet, Actoplus Met, Janumet, Glumetza or Metaglip. The above medications must be held the day of the exam AND 48 hours after the exam.  The purpose of you drinking the oral contrast is to aid in the visualization of your intestinal tract. The contrast solution may cause some diarrhea. Before your exam is started, you will be given a small amount of fluid to drink. Depending on your individual set of symptoms, you may also receive an intravenous injection of x-ray contrast/dye. Plan on being at [facility] for 30 minutes or long, depending on the type of exam you are having performed.  This test typically takes 30-45 minutes to complete.  If you have any questions regarding your exam or if you need to reschedule, you may call the CT department at [phone] between the hours of 8:00 am and 5:00 pm,  Monday-Friday.  ________________________________________________________________________   Your physician has requested that you go to the basement for the following lab work before leaving today: BUN, Creatinine  CC: [name], MD

## 2015-05-21 ENCOUNTER — Telehealth: Payer: Self-pay | Admitting: *Deleted

## 2015-05-21 ENCOUNTER — Ambulatory Visit (INDEPENDENT_AMBULATORY_CARE_PROVIDER_SITE_OTHER)
Admission: RE | Admit: 2015-05-21 | Discharge: 2015-05-21 | Disposition: A | Discharge status: Home / Self Care | Payer: Medicare Other | Source: Ambulatory Visit | Attending: Internal Medicine | Admitting: Internal Medicine

## 2015-05-21 DIAGNOSIS — R1031 Right lower quadrant pain: Secondary | ICD-10-CM | POA: Diagnosis not present

## 2015-05-21 DIAGNOSIS — R932 Abnormal findings on diagnostic imaging of liver and biliary tract: Secondary | ICD-10-CM

## 2015-05-21 MED ORDER — IOHEXOL 300 MG/ML  SOLN
100.0000 mL | Freq: Once | INTRAMUSCULAR | Status: AC | PRN
  Administered 2015-05-21: 100 mL via INTRAVENOUS

## 2015-05-21 NOTE — Telephone Encounter (Signed)
Patient has been advised of possible cirrhosis seen on CT. She will come for labs next week. Orders placed in EPIC.

## 2015-05-21 NOTE — Telephone Encounter (Signed)
-----   Message from Lafayette Dragon, MD sent at 05/21/2015  5:12 PM EDT ----- Please call patient, CT scan shows possible cirrhosis of the liver, please check Hep C and B serology,Ferritin, ceruloplasmin, ANA,AMA,Ferritin, Protime, Ammonia,Alpha1 antitrypsin

## 2015-05-23 ENCOUNTER — Other Ambulatory Visit: Payer: Self-pay | Admitting: Internal Medicine

## 2015-05-23 ENCOUNTER — Other Ambulatory Visit: Payer: Self-pay | Admitting: Cardiology

## 2015-05-25 ENCOUNTER — Other Ambulatory Visit (INDEPENDENT_AMBULATORY_CARE_PROVIDER_SITE_OTHER): Payer: Medicare Other

## 2015-05-25 DIAGNOSIS — R932 Abnormal findings on diagnostic imaging of liver and biliary tract: Secondary | ICD-10-CM | POA: Diagnosis not present

## 2015-05-25 LAB — PROTIME-INR
INR: 1 ratio (ref 0.8–1.0)
Prothrombin Time: 11.4 s (ref 9.6–13.1)

## 2015-05-25 LAB — FERRITIN: Ferritin: 25.9 ng/mL (ref 10.0–291.0)

## 2015-05-25 LAB — AMMONIA: Ammonia: 21 umol/L (ref 11–35)

## 2015-05-26 ENCOUNTER — Telehealth: Payer: Self-pay | Admitting: Internal Medicine

## 2015-05-26 LAB — ANA: Anti Nuclear Antibody(ANA): NEGATIVE

## 2015-05-26 LAB — HEPATITIS B CORE ANTIBODY, TOTAL: Hep B Core Total Ab: NONREACTIVE

## 2015-05-26 LAB — HEPATITIS B SURFACE ANTIGEN: Hepatitis B Surface Ag: NEGATIVE

## 2015-05-26 LAB — HEPATITIS C ANTIBODY: HCV Ab: NEGATIVE

## 2015-05-26 LAB — HEPATITIS B SURFACE ANTIBODY,QUALITATIVE: Hep B S Ab: NEGATIVE

## 2015-05-26 NOTE — Telephone Encounter (Signed)
Spoke with patient and told her part of the labs that are pending are checking for types of cirrhosis.

## 2015-05-27 LAB — ALPHA-1-ANTITRYPSIN: A-1 Antitrypsin, Ser: 139 mg/dL (ref 83–199)

## 2015-05-27 LAB — CERULOPLASMIN: Ceruloplasmin: 22 mg/dL (ref 18–53)

## 2015-05-28 LAB — ANTI-SMOOTH MUSCLE ANTIBODY, IGG: Smooth Muscle Ab: 8 U (ref <20)

## 2015-06-01 DIAGNOSIS — M5136 Other intervertebral disc degeneration, lumbar region: Secondary | ICD-10-CM | POA: Diagnosis not present

## 2015-06-02 ENCOUNTER — Other Ambulatory Visit: Payer: Self-pay | Admitting: Internal Medicine

## 2015-06-02 DIAGNOSIS — H3531 Nonexudative age-related macular degeneration: Secondary | ICD-10-CM | POA: Diagnosis not present

## 2015-06-02 DIAGNOSIS — H01004 Unspecified blepharitis left upper eyelid: Secondary | ICD-10-CM | POA: Diagnosis not present

## 2015-06-02 DIAGNOSIS — H524 Presbyopia: Secondary | ICD-10-CM | POA: Diagnosis not present

## 2015-06-02 DIAGNOSIS — H01002 Unspecified blepharitis right lower eyelid: Secondary | ICD-10-CM | POA: Diagnosis not present

## 2015-06-02 DIAGNOSIS — H04123 Dry eye syndrome of bilateral lacrimal glands: Secondary | ICD-10-CM | POA: Diagnosis not present

## 2015-06-02 DIAGNOSIS — H01005 Unspecified blepharitis left lower eyelid: Secondary | ICD-10-CM | POA: Diagnosis not present

## 2015-06-02 DIAGNOSIS — H01001 Unspecified blepharitis right upper eyelid: Secondary | ICD-10-CM | POA: Diagnosis not present

## 2015-06-02 LAB — HM DIABETES EYE EXAM

## 2015-06-11 ENCOUNTER — Encounter: Payer: Self-pay | Admitting: Internal Medicine

## 2015-06-11 ENCOUNTER — Ambulatory Visit (INDEPENDENT_AMBULATORY_CARE_PROVIDER_SITE_OTHER): Payer: Medicare Other | Admitting: Internal Medicine

## 2015-06-11 VITALS — BP 118/78 | HR 79 | Temp 98.3°F | Ht 63.0 in | Wt 199.2 lb

## 2015-06-11 DIAGNOSIS — E119 Type 2 diabetes mellitus without complications: Secondary | ICD-10-CM

## 2015-06-11 DIAGNOSIS — G9050 Complex regional pain syndrome I, unspecified: Secondary | ICD-10-CM | POA: Diagnosis not present

## 2015-06-11 DIAGNOSIS — I10 Essential (primary) hypertension: Secondary | ICD-10-CM | POA: Diagnosis not present

## 2015-06-11 DIAGNOSIS — I2510 Atherosclerotic heart disease of native coronary artery without angina pectoris: Secondary | ICD-10-CM

## 2015-06-11 LAB — BASIC METABOLIC PANEL
BUN: 24 mg/dL — ABNORMAL HIGH (ref 6–23)
CO2: 25 mEq/L (ref 19–32)
Calcium: 9.4 mg/dL (ref 8.4–10.5)
Chloride: 105 mEq/L (ref 96–112)
Creatinine, Ser: 0.93 mg/dL (ref 0.40–1.20)
GFR: 61.97 mL/min (ref >60.00)
Glucose, Bld: 135 mg/dL — ABNORMAL HIGH (ref 70–99)
Potassium: 4.7 mEq/L (ref 3.5–5.1)
Sodium: 138 mEq/L (ref 135–145)

## 2015-06-11 LAB — MICROALBUMIN / CREATININE URINE RATIO
Creatinine,U: 153.9 mg/dL
Microalb Creat Ratio: 0.7 mg/g (ref 0.0–30.0)
Microalb, Ur: 1.1 mg/dL (ref 0.0–1.9)

## 2015-06-11 LAB — HEMOGLOBIN A1C: Hgb A1c MFr Bld: 7 % — ABNORMAL HIGH (ref 4.6–6.5)

## 2015-06-11 MED ORDER — HYDROCODONE-ACETAMINOPHEN 10-325 MG PO TABS: 1.0000 | ORAL_TABLET | Freq: Four times a day (QID) | ORAL | Status: DC | PRN

## 2015-06-11 MED ORDER — LIDOCAINE 5 % EX PTCH: 1.0000 | MEDICATED_PATCH | CUTANEOUS | Status: DC

## 2015-06-11 NOTE — Progress Notes (Signed)
Subjective:    Patient ID: [name], female    DOB: 10/25/1934, 80 y.o.   MRN: [id number]  DOS:  06/11/2015 Type of visit - description : Routine visit Interval history: Since the last visit, had a laparoscopically assisted right hemicolectomy due to a polyp.  Last visit with GI last month, was seen with chronic RLQ abdominal pain, history of IBS, a CT of the abdomen was done, possible cirrhosis, workup was done by GI, no acute findings on CT.  Chest pain: Last cardiology note reviewed, had a negative stress test approximately 10-2014, was recommended to follow-up in 6 months  Diabetes: CBGs usually in the 100, 120 however since the surgery a few months ago blood sugars are increased to 150.  Back pain is still issue. Not well controlled with current medications  Hypertension, good compliance of medication, ambulatory BPs normal  Review of Systems Denies chest pain, occasionally DOE at baseline. Has sometimes explosive BMs and loose stools since the partial colectomy. No blood in the stools, currently without abdominal pain  Past Medical History  Diagnosis Date  . Hyperlipidemia     a. patient unwilling to use statins.  . Osteoporosis   . DVT (deep venous thrombosis) 12/2007  . PE (pulmonary embolism) 12/2007    a. PE/DVT after neck surgery 2009. b. coumadin d/c 10-2008.  . Pulmonary nodule     incidental per CT:  Pet scan 4-9: likely benign, CT 08-2009 no change, no further CTs (Dr. Gwenette Greet)  . Hemorrhoids   . IBS (irritable bowel syndrome)   . Diverticulosis   . PPD positive   . Macular degeneration 03/2009    Dr. Rosana Hoes  . Allergic rhinitis   . Dyspnea     a. Chronic, extensive w/u see OV note 09-2010. b. [place] 2011: ormal with RA 5 RV 31/2 PA 27/12 (19) PCW 10 CO normal. No evidence of shunting with sitting up in cath lab. c. CPX 2011: see report. d. Prior fluoro of diaphragm -  R diaphragm elevated at rest but both moved with inspiration.   Marland Kitchen Spinal stenosis   . GERD  (gastroesophageal reflux disease)     a. Hx GERD/esophageal dysmotility followed by Dr. Olevia Perches.   Marland Kitchen Hypertension   . Neuropathy     a. Hands, feet, legs.  . Orthostatic hypotension   . CAD (coronary artery disease)     a. Nonobst in 2011. b. Abnormal nuc 09/2013 -> s/p cutting balloon to D2; mild LAD disease 10/31/13.  . Osteomyelitis     a. Adm 04/2013: Charcot collapse of the right foot with osteomyelitis and ulceration, s/p excision.  . Venous insufficiency     a. Contributing to LEE.  . Type II diabetes mellitus   . DJD (degenerative joint disease)   . Arthritis     "back; ankles; hands; knees" (10/31/2013)  . Recurrent UTI   . 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 1994 - malignant polyp removed during colonoscopy.  . Hiatal hernia     a. s/p Nissen fundoplication 4174.  Marland Kitchen RSD (reflex sympathetic dystrophy)     a. Chronic pain.  . Abnormal echocardiogram     09/2013: mild LVH, mild focal basal hypertrophy of septum, EF 60-65%, normal WM, grade 1 diastolic dysfunction, MAC, mild LAE, ASA, PASP 34. Possible oscillating MV density - reviewed by MD and felt there was no significant abnormality other than MAC noted with mitral valve and did not require TEE.  Marland Kitchen  Adenomatous colon polyp   . Pneumonia   . Fatty liver   . PONV (postoperative nausea and vomiting) 2009    neck surgery    Past Surgical History  Procedure Laterality Date  . Nissen fundoplication  12/27/1060  . Cholecystectomy  03/2002  . Cataract extraction w/ intraocular lens  implant, bilateral  2004    feb 2004 left, aug 2004 right  . Rotator cuff repair Right 07/2007    Dr. Percell Miller  . Anterior cervical decomp/discectomy fusion  12/18/07    For OA,  Dr. Lorin Mercy:  fu by a PE  . Toe amputation Right 08/2008    3rd toe, Dr. Sharol Given due to osteomyelitis  . Nasal septum surgery  09/1963  . Vein ligation Bilateral 03/1966  . Carpal tunnel release Left 09/2011  . Amputation  03/06/2012    Procedure: AMPUTATION FOOT;  Surgeon:  Newt Minion, MD;  Location: [facility];  Service: Orthopedics;  Laterality: Left;  FIFTH RAY AMPUTATION   . Vena cava filter placement  01/2010    green filter; "due to blood clots"  . Ankle fusion  09/27/2012    Procedure: ANKLE FUSION;  Surgeon: Newt Minion, MD;  Location: [facility];  Service: Orthopedics;  Laterality: Left;  Left Tibiocalcaneal Fusion  . Ankle fusion Right 05/09/2013    Procedure: ANKLE FUSION;  Surgeon: Newt Minion, MD;  Location: [facility];  Service: Orthopedics;  Laterality: Right;  Excision Osteomyelitis Base 1st MT Right Foot, Fusion Medial Column  . Cardiac catheterization  05/2010     at [facility]  . Coronary angioplasty  10/31/2013  . Vaginal hysterectomy  03/1975  . Ankle surgery Left apr & june 1991  . Cyst removal hand  06/2003  . Rotator cuff repair Left 11/2004  . Wisdom tooth extraction  06/2007  . Foot bone excision Right 06/2009  . Left heart catheterization with coronary angiogram N/A 10/31/2013    Procedure: LEFT HEART CATHETERIZATION WITH CORONARY ANGIOGRAM;  Surgeon: Jettie Booze, MD;  Location: [facility] CATH LAB;  Service: Cardiovascular;  Laterality: N/A;  . Percutaneous coronary intervention-balloon only  10/31/2013    Procedure: PERCUTANEOUS CORONARY INTERVENTION-BALLOON ONLY;  Surgeon: Jettie Booze, MD;  Location: [facility] CATH LAB;  Service: Cardiovascular;;  . Laparoscopic right hemi colectomy N/A 01/08/2015    Procedure: LAPAROSCOPIC ASSISTED RIGHT HEMI COLECTOMY;  Surgeon: Johnathan Hausen, MD;  Location: [facility] ORS;  Service: General;  Laterality: N/A;    Social History   Social History  . Marital Status: Divorced    Spouse Name: N/A  . Number of Children: 3  . Years of Education: N/A   Occupational History  . Retired      from [employer]    Social History Main Topics  . Smoking status: Never Smoker   . Smokeless tobacco: Never Used     Comment: tried for a few months in college.   . Alcohol Use: No  . Drug Use: No  . Sexual Activity: No    Other Topics Concern  . Not on file   Social History Narrative   1 daughter lives w/ her , another daughter lives in town, 1 son in [place]                  Medication List       This list is accurate as of: 06/11/15 11:59 PM.  Always use your most recent med list.  acetaminophen 500 MG tablet  Commonly known as:  TYLENOL  Take 500 mg by mouth daily as needed for mild pain or moderate pain.     Alpha-Lipoic Acid 600 MG Caps  Take 600 mg by mouth 2 (two) times daily.     amLODipine-benazepril 5-10 MG per capsule  Commonly known as:  LOTREL  Take 1 capsule by mouth daily.     aspirin EC 81 MG tablet  Take 1 tablet (81 mg total) by mouth daily.     diclofenac sodium 1 % Gel  Commonly known as:  VOLTAREN  Apply 1 application topically daily as needed (pain).     erythromycin ophthalmic ointment  Place 1 application into both eyes daily.     freestyle lancets  USE TWICE A DAY     FREESTYLE LITE test strip  Generic drug:  glucose blood  CHECK BLOOD SUGAR TWICE A DAY     HYDROcodone-acetaminophen 10-325 MG per tablet  Commonly known as:  NORCO  Take 1 tablet by mouth every 6 (six) hours as needed for moderate pain or severe pain.     hyoscyamine 0.125 MG SL tablet  Commonly known as:  LEVSIN/SL  Place 1 tablet (0.125 mg total) under the tongue every 4 (four) hours as needed.     lidocaine 5 %  Commonly known as:  LIDODERM  Place 1 patch onto the skin daily. Remove & Discard patch within 12 hours or as directed by MD     meclizine 12.5 MG tablet  Commonly known as:  ANTIVERT  Take 1 tablet (12.5 mg total) by mouth 3 (three) times daily.     metoprolol succinate 25 MG 24 hr tablet  Commonly known as:  TOPROL-XL  TAKE 1 TABLET BY MOUTH ONCE DAILY     nabumetone 750 MG tablet  Commonly known as:  RELAFEN  Take 750 mg by mouth 2 (two) times daily.     nitroGLYCERIN 0.4 MG SL tablet  Commonly known as:  NITROSTAT  Place 1 tablet (0.4 mg total)  under the tongue every 5 (five) minutes as needed for chest pain.     nortriptyline 10 MG capsule  Commonly known as:  PAMELOR  Take 10 mg by mouth every evening.     omeprazole 20 MG capsule  Commonly known as:  PRILOSEC  Take 1 capsule (20 mg total) by mouth 2 (two) times daily.     OVER THE COUNTER MEDICATION  Take 1 capsule by mouth 2 (two) times daily. Integrative Digestive Formula     ranitidine 150 MG tablet  Commonly known as:  ZANTAC  Take 150 mg by mouth every evening.     REFRESH OP  Place 1 drop into both eyes daily as needed (dry eyes).     tiZANidine 4 MG tablet  Commonly known as:  ZANAFLEX  Take 4 mg by mouth every 8 (eight) hours as needed for muscle spasms.     trimethoprim 100 MG tablet  Commonly known as:  TRIMPEX  Take 100 mg by mouth every other day.     Vitamin B-12 2500 MCG Subl  Place 2,500 mcg under the tongue every evening.     Vitamin D3 5000 UNITS Caps  Take 5,000 Units by mouth every evening.           Objective:   Physical Exam BP 118/78 mmHg  Pulse 79  Temp(Src) 98.3 F (36.8 C) (Oral)  Ht 5\' 3"  (1.6 m)  Wt 199 lb 4  oz (90.379 kg)  BMI 35.30 kg/m2  SpO2 97% General:   Well developed, well nourished . NAD.  HEENT:  Normocephalic . Face symmetric, atraumatic Lungs:  CTA B Normal respiratory effort, no intercostal retractions, no accessory muscle use. Heart: RRR,  no murmur.  Trace pretibial edema bilaterally  Skin: Not pale. Not jaundice  Neurologic:  alert & oriented X3.  Speech normal, gait appropriate for age and unassisted Psych--  Cognition and judgment appear intact.  Cooperative with normal attention span and concentration.  Behavior appropriate. No anxious or depressed appearing.      Assessment & Plan:  Today, I spent more than  30  min with the patient: >50% of the time counseling regards pain management option, listening to her concerns, reviewing the chart

## 2015-06-11 NOTE — Assessment & Plan Note (Signed)
Symptoms well-controlled, check a BMP, continue amlodipine, benazepril, metoprolol.

## 2015-06-11 NOTE — Progress Notes (Signed)
Pre visit review using our clinic review tool, if applicable. No additional management support is needed unless otherwise documented below in the visit note. 

## 2015-06-11 NOTE — Patient Instructions (Addendum)
Get your blood work before you leave    Continue Norco Start use the lidocaine patch, watch for an allergic reaction  Watch your doses of Tylenol (acetaminophen) Limit your Tylenol to 3 grams a day. Norco contains 325 mg on each tablet and you take 4 a day  Each Tylenol tablet contained between 500 and 650 mg

## 2015-06-11 NOTE — Assessment & Plan Note (Signed)
Since she had a partial colectomy, CBGs increased to the 150s. Currently on diet control, check A1c, further advice would results.

## 2015-06-11 NOTE — Assessment & Plan Note (Addendum)
The patient has RSV, neuropathy, DJD. Main pain today is back pain, not well controlled by hydrocodone at current doses and Relafen once or twice a day. She takes nortriptyline also for leg cramps. She had local injections with Dr. Maia Petties without apparent help. She took oxycodone after her recent surgery and that did NOT seem to help better than hydrocodone. Intolerant to Cymbalta, Neurontin and Lyrica helped but caused edema. Plan: Trial with a lidocaine patch, she has a history of allergies to some adhesive tape allergies but is willing to try   Continue with hydrocodone at current doses, reminded about Tylenol overdose. See instructions. Discussed a referral to pain management, she will let me know if interested

## 2015-06-27 ENCOUNTER — Other Ambulatory Visit: Payer: Self-pay | Admitting: Internal Medicine

## 2015-07-01 DIAGNOSIS — M545 Low back pain: Secondary | ICD-10-CM | POA: Diagnosis not present

## 2015-07-01 DIAGNOSIS — M5136 Other intervertebral disc degeneration, lumbar region: Secondary | ICD-10-CM | POA: Diagnosis not present

## 2015-07-01 DIAGNOSIS — M5416 Radiculopathy, lumbar region: Secondary | ICD-10-CM | POA: Diagnosis not present

## 2015-07-01 DIAGNOSIS — M47816 Spondylosis without myelopathy or radiculopathy, lumbar region: Secondary | ICD-10-CM | POA: Diagnosis not present

## 2015-07-03 ENCOUNTER — Other Ambulatory Visit: Payer: Self-pay | Admitting: Internal Medicine

## 2015-07-12 ENCOUNTER — Other Ambulatory Visit: Payer: Self-pay

## 2015-07-12 DIAGNOSIS — Z1231 Encounter for screening mammogram for malignant neoplasm of breast: Secondary | ICD-10-CM

## 2015-07-21 ENCOUNTER — Other Ambulatory Visit: Payer: Self-pay | Admitting: Orthopedic Surgery

## 2015-07-21 ENCOUNTER — Other Ambulatory Visit: Payer: Medicare Other

## 2015-07-21 DIAGNOSIS — M25551 Pain in right hip: Secondary | ICD-10-CM | POA: Diagnosis not present

## 2015-07-21 DIAGNOSIS — M810 Age-related osteoporosis without current pathological fracture: Secondary | ICD-10-CM | POA: Diagnosis not present

## 2015-07-21 DIAGNOSIS — M170 Bilateral primary osteoarthritis of knee: Secondary | ICD-10-CM | POA: Diagnosis not present

## 2015-07-21 DIAGNOSIS — M25552 Pain in left hip: Secondary | ICD-10-CM | POA: Diagnosis not present

## 2015-07-21 DIAGNOSIS — M545 Low back pain: Secondary | ICD-10-CM

## 2015-07-21 DIAGNOSIS — M4806 Spinal stenosis, lumbar region: Secondary | ICD-10-CM | POA: Diagnosis not present

## 2015-07-22 ENCOUNTER — Ambulatory Visit
Admission: RE | Admit: 2015-07-22 | Discharge: 2015-07-22 | Disposition: A | Discharge status: Home / Self Care | Payer: Medicare Other | Source: Ambulatory Visit | Attending: Orthopedic Surgery | Admitting: Orthopedic Surgery

## 2015-07-22 DIAGNOSIS — M4806 Spinal stenosis, lumbar region: Secondary | ICD-10-CM | POA: Diagnosis not present

## 2015-07-22 DIAGNOSIS — M545 Low back pain: Secondary | ICD-10-CM

## 2015-07-27 DIAGNOSIS — Z1231 Encounter for screening mammogram for malignant neoplasm of breast: Secondary | ICD-10-CM | POA: Diagnosis not present

## 2015-07-27 LAB — HM MAMMOGRAPHY

## 2015-07-28 ENCOUNTER — Telehealth: Payer: Self-pay | Admitting: *Deleted

## 2015-07-28 DIAGNOSIS — E1142 Type 2 diabetes mellitus with diabetic polyneuropathy: Secondary | ICD-10-CM | POA: Diagnosis not present

## 2015-07-28 DIAGNOSIS — L02612 Cutaneous abscess of left foot: Secondary | ICD-10-CM | POA: Diagnosis not present

## 2015-07-28 DIAGNOSIS — L03116 Cellulitis of left lower limb: Secondary | ICD-10-CM | POA: Diagnosis not present

## 2015-07-28 DIAGNOSIS — L89891 Pressure ulcer of other site, stage 1: Secondary | ICD-10-CM | POA: Diagnosis not present

## 2015-07-28 MED ORDER — OMEPRAZOLE 20 MG PO CPDR: 20.0000 mg | DELAYED_RELEASE_CAPSULE | Freq: Two times a day (BID) | ORAL | Status: DC

## 2015-07-28 NOTE — Telephone Encounter (Signed)
Patient is a former patient of Dr Nichola Sizer, last seen 05/19/15. She will be establishing care with Dr Silverio Decamp on 09/22/15. She requests refills of omeprazole. Dr Silverio Decamp, may I refill omeprazole under you until patient's appointment?

## 2015-07-28 NOTE — Telephone Encounter (Signed)
Ok to refill Omeprazole. Thanks

## 2015-07-28 NOTE — Telephone Encounter (Signed)
Rx sent 

## 2015-08-02 ENCOUNTER — Encounter: Payer: Self-pay | Admitting: Internal Medicine

## 2015-08-08 ENCOUNTER — Other Ambulatory Visit: Payer: Self-pay | Admitting: Internal Medicine

## 2015-08-10 ENCOUNTER — Other Ambulatory Visit: Payer: Self-pay | Admitting: Cardiology

## 2015-08-10 ENCOUNTER — Telehealth: Payer: Self-pay | Admitting: Internal Medicine

## 2015-08-10 MED ORDER — HYDROCODONE-ACETAMINOPHEN 10-325 MG PO TABS: 1.0000 | ORAL_TABLET | Freq: Four times a day (QID) | ORAL | Status: DC | PRN

## 2015-08-10 NOTE — Telephone Encounter (Signed)
Rx's for October and November 2016 printed, awaiting MD signature. Controlled Substance Contract printed.

## 2015-08-10 NOTE — Telephone Encounter (Signed)
Pt is requesting refill on Hydrocodone.   Last OV: 06/11/2015 Last Fill: 06/11/2015 #120 and 0 RF, For 05/2015 and 06/2015 UDS: 06/04/2014 Low risk  Needs UDS and contract at time of pick up.  Please advise.

## 2015-08-10 NOTE — Telephone Encounter (Signed)
Okay to prescriptions as before. UDS and contract needed

## 2015-08-10 NOTE — Telephone Encounter (Signed)
Informed Pt that Rx's are ready for pick up at the front desk at her convenience. Pt verbalized understanding.

## 2015-08-10 NOTE — Telephone Encounter (Signed)
Pt needing refill on Hydrocodone. She will be in our area tomorrow. She has a few days left but wants to pick up tomorrow if possible so she doesn't have to drive back this way again. Pt said she usually gets 2 months RXs. Please call when RX ready to pick up [phone].

## 2015-08-11 DIAGNOSIS — Z79891 Long term (current) use of opiate analgesic: Secondary | ICD-10-CM | POA: Diagnosis not present

## 2015-08-17 ENCOUNTER — Telehealth: Payer: Self-pay

## 2015-08-17 NOTE — Telephone Encounter (Signed)
UDS: 08/11/2015   Positive for Hydrocodone Positive for Nortriptyline   Low risk per Dr. Larose Kells 08/17/2015

## 2015-08-18 DIAGNOSIS — Z6835 Body mass index (BMI) 35.0-35.9, adult: Secondary | ICD-10-CM | POA: Diagnosis not present

## 2015-08-18 DIAGNOSIS — N760 Acute vaginitis: Secondary | ICD-10-CM | POA: Diagnosis not present

## 2015-08-18 DIAGNOSIS — Z124 Encounter for screening for malignant neoplasm of cervix: Secondary | ICD-10-CM | POA: Diagnosis not present

## 2015-08-18 DIAGNOSIS — Z01419 Encounter for gynecological examination (general) (routine) without abnormal findings: Secondary | ICD-10-CM | POA: Diagnosis not present

## 2015-08-20 ENCOUNTER — Telehealth: Payer: Self-pay | Admitting: Internal Medicine

## 2015-08-20 DIAGNOSIS — G9050 Complex regional pain syndrome I, unspecified: Secondary | ICD-10-CM

## 2015-08-20 NOTE — Telephone Encounter (Signed)
OV note from 06/11/2015 reviewed, Pt was going to think about referral to pain management and call if interested. Referral placed.

## 2015-08-20 NOTE — Telephone Encounter (Signed)
Caller name:Silfies,Denise Relation to pt: daughter  Call back [phone]   Reason for call:  Daughter requesting a referral to [facility]

## 2015-08-31 DIAGNOSIS — E1142 Type 2 diabetes mellitus with diabetic polyneuropathy: Secondary | ICD-10-CM | POA: Diagnosis not present

## 2015-08-31 DIAGNOSIS — E11610 Type 2 diabetes mellitus with diabetic neuropathic arthropathy: Secondary | ICD-10-CM | POA: Diagnosis not present

## 2015-08-31 DIAGNOSIS — L97511 Non-pressure chronic ulcer of other part of right foot limited to breakdown of skin: Secondary | ICD-10-CM | POA: Diagnosis not present

## 2015-09-01 ENCOUNTER — Encounter: Payer: Self-pay | Admitting: Cardiology

## 2015-09-01 ENCOUNTER — Ambulatory Visit (INDEPENDENT_AMBULATORY_CARE_PROVIDER_SITE_OTHER): Payer: Medicare Other | Admitting: Cardiology

## 2015-09-01 VITALS — BP 122/56 | HR 87 | Ht 63.0 in | Wt 202.0 lb

## 2015-09-01 DIAGNOSIS — I2583 Coronary atherosclerosis due to lipid rich plaque: Secondary | ICD-10-CM

## 2015-09-01 DIAGNOSIS — R0600 Dyspnea, unspecified: Secondary | ICD-10-CM

## 2015-09-01 DIAGNOSIS — I10 Essential (primary) hypertension: Secondary | ICD-10-CM | POA: Diagnosis not present

## 2015-09-01 DIAGNOSIS — R0609 Other forms of dyspnea: Secondary | ICD-10-CM | POA: Diagnosis not present

## 2015-09-01 DIAGNOSIS — E785 Hyperlipidemia, unspecified: Secondary | ICD-10-CM | POA: Diagnosis not present

## 2015-09-01 DIAGNOSIS — I2510 Atherosclerotic heart disease of native coronary artery without angina pectoris: Secondary | ICD-10-CM

## 2015-09-01 LAB — CBC WITH DIFFERENTIAL/PLATELET
Basophils Absolute: 0 10*3/uL (ref 0.0–0.1)
Basophils Relative: 0 % (ref 0–1)
Eosinophils Absolute: 0.5 10*3/uL (ref 0.0–0.7)
Eosinophils Relative: 8 % — ABNORMAL HIGH (ref 0–5)
HCT: 39.7 % (ref 36.0–46.0)
Hemoglobin: 12.8 g/dL (ref 12.0–15.0)
Lymphocytes Relative: 32 % (ref 12–46)
Lymphs Abs: 2.1 10*3/uL (ref 0.7–4.0)
MCH: 28.3 pg (ref 26.0–34.0)
MCHC: 32.2 g/dL (ref 30.0–36.0)
MCV: 87.8 fL (ref 78.0–100.0)
MPV: 9.9 fL (ref 8.6–12.4)
Monocytes Absolute: 0.5 10*3/uL (ref 0.1–1.0)
Monocytes Relative: 7 % (ref 3–12)
Neutro Abs: 3.4 10*3/uL (ref 1.7–7.7)
Neutrophils Relative %: 53 % (ref 43–77)
Platelets: 203 10*3/uL (ref 150–400)
RBC: 4.52 MIL/uL (ref 3.87–5.11)
RDW: 14 % (ref 11.5–15.5)
WBC: 6.5 10*3/uL (ref 4.0–10.5)

## 2015-09-01 LAB — TSH: TSH: 1.308 u[IU]/mL (ref 0.350–4.500)

## 2015-09-01 MED ORDER — LISINOPRIL 10 MG PO TABS: 10.0000 mg | ORAL_TABLET | Freq: Every day | ORAL | Status: DC

## 2015-09-01 MED ORDER — AMLODIPINE BESYLATE 2.5 MG PO TABS: 2.5000 mg | ORAL_TABLET | Freq: Every day | ORAL | Status: DC

## 2015-09-01 NOTE — Patient Instructions (Signed)
Medication Instructions:   STOP LOTREL NOW  START AMLODIPINE 2.5 MG ONCE DAILY  START LISINOPRIL 10 MG ONCE DAILY    Labwork:  TODAY---CMET, CBC W DIFF, AND TSH    Testing/Procedures:  Your physician has requested that you have a lexiscan myoview. For further information please visit [email or web address]. Please follow instruction sheet, as given.     Follow-Up:  2 MONTH WITH DR Meda Coffee       If you need a refill on your cardiac medications before your next appointment, please call your pharmacy.

## 2015-09-01 NOTE — Progress Notes (Signed)
Patient ID: [name], female   DOB: [birth year], 80 y.o.   MRN: [id number]     Date:  09/01/2015   ID:  [name], DOB Apr 18, 1935, MRN [id number]  PCP:  [name], MD  Cardiologist:  Dr. Thompson Grayer => Dr. Ena Dawley     History of Present Illness: [name] is a 80 y.o. female with a hx of HTN, HL, T2DM, orthostatic hypotension, prior DVT after neck surgery in 2010, s/p IVC filter, GERD and reflex sympathetic dystrophy.    She had an extensive workup for dyspnea in the past.  Please see my last office visit note from 10/29/2013 for complete details.  Patient was evaluated by Dr. Rayann Heman in January for chest pain and shortness of breath.  Myoview was abnormal and cardiac catheterization was recommended. Patient was admitted 1/9-1/10. Cardiac catheterization demonstrated high-grade disease in the second diagonal which was treated with cutting balloon angioplasty.  Patient was to have GI evaluation in the near future (EGD and colonoscopy).  She had a recent history of dysphagia as well as rectal bleeding with prior history of adenomatous polyps and in situ carcinoma.  Therefore, long term commitment to dual antiplatelet Rx was to be avoided.    She has noted recurrent left-sided chest discomfort. She feels as though that this is indigestion. She has had this for years without significant change. She is fairly sedentary. She cannot tell me if she has had significant improvement with any symptoms since her angioplasty. She denies associated dyspnea. She denies exertional chest discomfort. She does take Gas-X for her chest discomfort with relief.  Overall, she describes NYHA class 2-2b symptoms.  She denies syncope. She denies significant pedal edema.  She is requesting clearance to undergo epidural steroid injections as well as EGD/colonoscopy.  Echocardiogram (10/14/13): Mild LVH, mild focal basal hypertrophy of the septum, EF 60-65%, normal wall motion, grade 1 diastolic dysfunction, MAC,  mild LAE, atrial septal aneurysm, PASP 34. There was a possible oscillating density on the mitral valve on parasternal long axis views (related to MAC).   LexiScan Myoview (10/20/13): Intermediate risk; medium-size reversible defect in the mid LAD territory, EF 68%. LHC (10/31/13): Proximal D2 95, mid circumflex 40, LVEDP 10.  PCI:  Cutting Balloon angioplasty to D2.  The patient has a long history of multiple polyps in her cecum and colon, with history of a malignant polyp many years ago. She just underwent a colonoscopy and EGD with finding of 3 polyps with removal of 2 and partial removal of a large 1. Biopsy showed tubular adenoma and she has a scheduled followup appointment with Dr. Maurene Capes to discuss further management.  She otherwise reports stable chest pain and back pain that's not related to exertion and is relieved by antacids. She denies any shortness of breath, palpitation or syncope.  06/16/2014 - this is a 3 months followup, in the interim the patient underwent EGD and colonoscopy with removal of multiple small and 1 large polyp that was associated with prolonged abdominal pain. The patient states that she didn't have bleeding but is persuaded that the pain resolved with Bactrim. She has been seeing Dr. Maurene Capes for this. She has noticed also on and off chest pain and back pain pretty much similar to the symptoms she had prior she received her stent to diagonal artery a year ago. She has stopped taking Plavix prior to her colonoscopy and hasn't restarted yet. She has stable exertional dyspnea no palpitations or syncope.  09/01/15 -  the patient is coming after 6 months, she has underwent a partial colectomy for malignant polyp earlier this year and is till recovering, shefeels tired and has some worsening SOB. No chest pain, no palpitations or syncope. No orthostatic hypotension. She complains that periodically her lips turns blue.    Recent Labs: 01/09/2015: Hemoglobin 12.0 06/11/2015:  Creatinine, Ser 0.93; Potassium 4.7  Wt Readings from Last 3 Encounters:  09/01/15 202 lb (91.627 kg)  06/11/15 199 lb 4 oz (90.379 kg)  05/19/15 206 lb 3.2 oz (93.532 kg)     Past Medical History  Diagnosis Date  . Hyperlipidemia     a. patient unwilling to use statins.  . Osteoporosis   . DVT (deep venous thrombosis) ([place]) 12/2007  . PE (pulmonary embolism) 12/2007    a. PE/DVT after neck surgery 2009. b. coumadin d/c 10-2008.  . Pulmonary nodule     incidental per CT:  Pet scan 4-9: likely benign, CT 08-2009 no change, no further CTs (Dr. Gwenette Greet)  . Hemorrhoids   . IBS (irritable bowel syndrome)   . Diverticulosis   . PPD positive   . Macular degeneration 03/2009    Dr. Rosana Hoes  . Allergic rhinitis   . Dyspnea     a. Chronic, extensive w/u see OV note 09-2010. b. [place] 2011: ormal with RA 5 RV 31/2 PA 27/12 (19) PCW 10 CO normal. No evidence of shunting with sitting up in cath lab. c. CPX 2011: see report. d. Prior fluoro of diaphragm -  R diaphragm elevated at rest but both moved with inspiration.   Marland Kitchen Spinal stenosis   . GERD (gastroesophageal reflux disease)     a. Hx GERD/esophageal dysmotility followed by Dr. Olevia Perches.   Marland Kitchen Hypertension   . Neuropathy ([place])     a. Hands, feet, legs.  . Orthostatic hypotension   . CAD (coronary artery disease)     a. Nonobst in 2011. b. Abnormal nuc 09/2013 -> s/p cutting balloon to D2; mild LAD disease 10/31/13.  . Osteomyelitis ([place])     a. Adm 04/2013: Charcot collapse of the right foot with osteomyelitis and ulceration, s/p excision.  . Venous insufficiency     a. Contributing to LEE.  . Type II diabetes mellitus ([place])   . DJD (degenerative joint disease)   . Arthritis     "back; ankles; hands; knees" (10/31/2013)  . Recurrent UTI   . 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 1994 - malignant polyp removed during colonoscopy.  . Hiatal hernia     a. s/p Nissen fundoplication 2353.  Marland Kitchen RSD (reflex sympathetic dystrophy)     a. Chronic pain.    . Abnormal echocardiogram     09/2013: mild LVH, mild focal basal hypertrophy of septum, EF 60-65%, normal WM, grade 1 diastolic dysfunction, MAC, mild LAE, ASA, PASP 34. Possible oscillating MV density - reviewed by MD and felt there was no significant abnormality other than MAC noted with mitral valve and did not require TEE.  . Adenomatous colon polyp   . Pneumonia   . Fatty liver   . PONV (postoperative nausea and vomiting) 2009    neck surgery    Current Outpatient Prescriptions  Medication Sig Dispense Refill  . Alpha-Lipoic Acid 600 MG CAPS Take 600 mg by mouth 2 (two) times daily.    Marland Kitchen amLODipine-benazepril (LOTREL) 5-10 MG per capsule Take 1 capsule by mouth daily. 30 capsule 4  . aspirin EC 81 MG tablet Take 1  tablet (81 mg total) by mouth daily. (Patient taking differently: Take 81 mg by mouth at bedtime. )    . Cholecalciferol (VITAMIN D3) 5000 UNITS CAPS Take 5,000 Units by mouth every evening.    . Cyanocobalamin (VITAMIN B-12) 2500 MCG SUBL Place 2,500 mcg under the tongue every evening.     . diclofenac sodium (VOLTAREN) 1 % GEL Apply 1 application topically daily as needed (pain).    Marland Kitchen erythromycin ophthalmic ointment Place 1 application into both eyes daily.    Marland Kitchen FREESTYLE LITE test strip CHECK BLOOD SUGAR TWICE A DAY 100 each 10  . HYDROcodone-acetaminophen (NORCO) 10-325 MG tablet Take 1 tablet by mouth every 6 (six) hours as needed for moderate pain or severe pain. 120 tablet 0  . hyoscyamine (LEVSIN/SL) 0.125 MG SL tablet Place 1 tablet (0.125 mg total) under the tongue every 4 (four) hours as needed. 30 tablet 1  . Lancets (FREESTYLE) lancets Check blood sugar twice daily. 100 each 12  . lidocaine (LIDODERM) 5 % Place 1 patch onto the skin daily. Remove & Discard patch within 12 hours or as directed by MD 30 patch 0  . meclizine (ANTIVERT) 12.5 MG tablet Take 1 tablet (12.5 mg total) by mouth 3 (three) times daily. 90 tablet 1  . metoprolol succinate (TOPROL-XL) 25 MG  24 hr tablet TAKE 1 TABLET BY MOUTH ONCE DAILY 30 tablet 5  . nabumetone (RELAFEN) 750 MG tablet Take 750 mg by mouth 2 (two) times daily.    Marland Kitchen NITROSTAT 0.4 MG SL tablet place 1 tablet under the tongue if needed every 5 minutes for chest pain 100 tablet 1  . nortriptyline (PAMELOR) 10 MG capsule Take 10 mg by mouth every evening.     Marland Kitchen omeprazole (PRILOSEC) 20 MG capsule Take 1 capsule (20 mg total) by mouth 2 (two) times daily. 180 capsule 0  . OVER THE COUNTER MEDICATION Take 1 capsule by mouth 2 (two) times daily. Integrative Digestive Formula    . Polyvinyl Alcohol-Povidone (REFRESH OP) Place 1 drop into both eyes daily as needed (dry eyes).    . ranitidine (ZANTAC) 150 MG tablet Take 150 mg by mouth every evening.    Marland Kitchen tiZANidine (ZANAFLEX) 4 MG tablet Take 4 mg by mouth every 8 (eight) hours as needed for muscle spasms.    Marland Kitchen trimethoprim (TRIMPEX) 100 MG tablet Take 100 mg by mouth every other day.     No current facility-administered medications for this visit.    Allergies:   Cymbalta; Gabapentin; Adhesive; Cefazolin; Ciprofloxacin; Clorazepate dipotassium; Darifenacin hydrobromide; Dilaudid; Doxycycline; Enablex; Levofloxacin; Lyrica; Methadone hcl; Morphine and related; Pentazocine lactate; and Talwin   Social History:  The patient  reports that she has never smoked. She has never used smokeless tobacco. She reports that she does not drink alcohol or use illicit drugs.   Family History:  The patient's family history includes Breast cancer in an other family member; Diabetic kidney disease in her daughter; Headache in her sister; Heart disease in her mother; Hypertension in an other family member; Migraines in her sister; Rheum arthritis in her mother. There is no history of Colon cancer, Esophageal cancer, Stomach cancer, or Rectal cancer.   ROS:  Please see the history of present illness.   All other systems reviewed and negative.   PHYSICAL EXAM: VS:  BP 122/56 mmHg  Pulse 87   Ht 5\' 3"  (1.6 m)  Wt 202 lb (91.627 kg)  BMI 35.79 kg/m2  SpO2 98% Well nourished,  well developed, in no acute distress HEENT: normal Neck: no JVD at 90 Cardiac:  normal S1, S2; RRR; no murmur Lungs:  clear to auscultation bilaterally, no wheezing, rhonchi or rales Abd: soft, nontender, no hepatomegaly Ext: trace bilateral LE edema right wrist without hematoma or mass  Skin: warm and dry Neuro:  CNs 2-12 intact, no focal abnormalities noted  EKG:  NSR, HR 74, normal axis, nonspecific ST-T wave changes   ASSESSMENT AND PLAN:  1. Chest Pain:  Negative stress test 1 year ago, she has worsening SOB and is minimally active. We will repeat a Lexiscan nuclear stress test.  2. CAD: She will continue aspirin and beta blocker. she is adamant about not taking statins. Off Plavix now.  3. Hypertension:  Controlled. 4. Hyperlipidemia - she is adamant about not taking statins. 5. Disposition:  F/u  In 2 months.    Check CMP, CBC, TSH today.  Signed, Dorothy Spark, MD  09/01/2015 3:09 PM  z

## 2015-09-02 ENCOUNTER — Telehealth: Payer: Self-pay | Admitting: Cardiology

## 2015-09-02 LAB — COMPREHENSIVE METABOLIC PANEL
ALT: 10 U/L (ref 6–29)
AST: 15 U/L (ref 10–35)
Albumin: 4.2 g/dL (ref 3.6–5.1)
Alkaline Phosphatase: 62 U/L (ref 33–130)
BUN: 16 mg/dL (ref 7–25)
CO2: 26 mmol/L (ref 20–31)
Calcium: 9.4 mg/dL (ref 8.6–10.4)
Chloride: 104 mmol/L (ref 98–110)
Creat: 0.9 mg/dL (ref 0.60–0.93)
Glucose, Bld: 224 mg/dL — ABNORMAL HIGH (ref 65–99)
Potassium: 4.8 mmol/L (ref 3.5–5.3)
Sodium: 141 mmol/L (ref 135–146)
Total Bilirubin: 0.4 mg/dL (ref 0.2–1.2)
Total Protein: 6.8 g/dL (ref 6.1–8.1)

## 2015-09-02 NOTE — Telephone Encounter (Signed)
Pt notified of normal labs, just elevated BS per Dr Meda Coffee.  Pt verbalized understanding.

## 2015-09-02 NOTE — Telephone Encounter (Signed)
New message ° ° ° ° °Returning a call to the nurse °

## 2015-09-07 DIAGNOSIS — L97511 Non-pressure chronic ulcer of other part of right foot limited to breakdown of skin: Secondary | ICD-10-CM | POA: Diagnosis not present

## 2015-09-08 ENCOUNTER — Telehealth (HOSPITAL_COMMUNITY): Payer: Self-pay | Admitting: *Deleted

## 2015-09-08 NOTE — Telephone Encounter (Signed)
Patient given detailed instructions per Myocardial Perfusion Study Information Sheet for the test on 09/14/15 at 1000. Patient notified to arrive 15 minutes early and that it is imperative to arrive on time for appointment to keep from having the test rescheduled.  If you need to cancel or reschedule your appointment, please call the office within 24 hours of your appointment. Failure to do so may result in a cancellation of your appointment, and a $50 no show fee. Patient verbalized understanding.Gerilyn Stargell J Danniel Tones, RN  

## 2015-09-14 ENCOUNTER — Ambulatory Visit (HOSPITAL_COMMUNITY): Payer: Medicare Other | Attending: Internal Medicine

## 2015-09-14 DIAGNOSIS — I2510 Atherosclerotic heart disease of native coronary artery without angina pectoris: Secondary | ICD-10-CM | POA: Diagnosis not present

## 2015-09-14 DIAGNOSIS — I10 Essential (primary) hypertension: Secondary | ICD-10-CM | POA: Diagnosis not present

## 2015-09-14 DIAGNOSIS — R079 Chest pain, unspecified: Secondary | ICD-10-CM | POA: Insufficient documentation

## 2015-09-14 DIAGNOSIS — R0609 Other forms of dyspnea: Secondary | ICD-10-CM | POA: Diagnosis not present

## 2015-09-14 DIAGNOSIS — E785 Hyperlipidemia, unspecified: Secondary | ICD-10-CM | POA: Insufficient documentation

## 2015-09-14 DIAGNOSIS — E119 Type 2 diabetes mellitus without complications: Secondary | ICD-10-CM | POA: Insufficient documentation

## 2015-09-14 DIAGNOSIS — R0600 Dyspnea, unspecified: Secondary | ICD-10-CM

## 2015-09-14 LAB — MYOCARDIAL PERFUSION IMAGING
LV dias vol: 67 mL
LV sys vol: 20 mL
Peak HR: 100 {beats}/min
RATE: 0.33
Rest HR: 89 {beats}/min
SDS: 1
SRS: 5
SSS: 6
TID: 1.07

## 2015-09-14 MED ORDER — TECHNETIUM TC 99M SESTAMIBI GENERIC - CARDIOLITE
32.1000 | Freq: Once | INTRAVENOUS | Status: AC | PRN
  Administered 2015-09-14: 32.1 via INTRAVENOUS

## 2015-09-14 MED ORDER — TECHNETIUM TC 99M SESTAMIBI GENERIC - CARDIOLITE
11.0000 | Freq: Once | INTRAVENOUS | Status: AC | PRN
  Administered 2015-09-14: 11 via INTRAVENOUS

## 2015-09-14 MED ORDER — REGADENOSON 0.4 MG/5ML IV SOLN
0.4000 mg | Freq: Once | INTRAVENOUS | Status: AC
  Administered 2015-09-14: 0.4 mg via INTRAVENOUS

## 2015-09-21 ENCOUNTER — Telehealth: Payer: Self-pay | Admitting: *Deleted

## 2015-09-21 NOTE — Telephone Encounter (Signed)
Filled out as much as possible and forwarded to Dr. Larose Kells. JG//CMA

## 2015-09-22 ENCOUNTER — Ambulatory Visit: Payer: Medicare Other | Admitting: Gastroenterology

## 2015-09-30 DIAGNOSIS — E1142 Type 2 diabetes mellitus with diabetic polyneuropathy: Secondary | ICD-10-CM | POA: Diagnosis not present

## 2015-09-30 DIAGNOSIS — M2041 Other hammer toe(s) (acquired), right foot: Secondary | ICD-10-CM | POA: Diagnosis not present

## 2015-09-30 DIAGNOSIS — L97511 Non-pressure chronic ulcer of other part of right foot limited to breakdown of skin: Secondary | ICD-10-CM | POA: Diagnosis not present

## 2015-09-30 DIAGNOSIS — L97512 Non-pressure chronic ulcer of other part of right foot with fat layer exposed: Secondary | ICD-10-CM | POA: Diagnosis not present

## 2015-10-01 ENCOUNTER — Ambulatory Visit: Payer: Medicare Other | Admitting: Cardiology

## 2015-10-01 ENCOUNTER — Other Ambulatory Visit: Payer: Self-pay | Admitting: Internal Medicine

## 2015-10-07 ENCOUNTER — Encounter: Payer: Self-pay | Admitting: Cardiology

## 2015-10-07 ENCOUNTER — Ambulatory Visit (INDEPENDENT_AMBULATORY_CARE_PROVIDER_SITE_OTHER): Payer: Medicare Other | Admitting: Cardiology

## 2015-10-07 VITALS — BP 122/64 | HR 77 | Ht 63.0 in | Wt 197.0 lb

## 2015-10-07 DIAGNOSIS — I10 Essential (primary) hypertension: Secondary | ICD-10-CM | POA: Diagnosis not present

## 2015-10-07 DIAGNOSIS — R0609 Other forms of dyspnea: Secondary | ICD-10-CM

## 2015-10-07 DIAGNOSIS — E785 Hyperlipidemia, unspecified: Secondary | ICD-10-CM

## 2015-10-07 DIAGNOSIS — I2583 Coronary atherosclerosis due to lipid rich plaque: Principal | ICD-10-CM

## 2015-10-07 DIAGNOSIS — R0600 Dyspnea, unspecified: Secondary | ICD-10-CM

## 2015-10-07 DIAGNOSIS — I2510 Atherosclerotic heart disease of native coronary artery without angina pectoris: Secondary | ICD-10-CM | POA: Diagnosis not present

## 2015-10-07 NOTE — Patient Instructions (Signed)
Your physician recommends that you continue on your current medications as directed. Please refer to the Current Medication list given to you today.     Your physician wants you to follow-up in: 6 MONTHS WITH DR NELSON You will receive a reminder letter in the mail two months in advance. If you don't receive a letter, please call our office to schedule the follow-up appointment.  

## 2015-10-07 NOTE — Progress Notes (Signed)
Patient ID: [name], female   DOB: 27-Oct-1934, 80 y.o.   MRN: [id number]     Date:  10/07/2015   ID:  [name], DOB [birth year], MRN [id number]  PCP:  Mary November, MD  Cardiologist:  Dr. Thompson Gould => Dr. Ena Gould    Chief complain: DOE, follow up post stress test.   History of Present Illness: [name] is a 80 y.o. female with a hx of HTN, HL, T2DM, orthostatic hypotension, prior DVT after neck surgery in 2010, s/p IVC filter, GERD and reflex sympathetic dystrophy.    She had an extensive workup for dyspnea in the past.  Please see my last office visit note from 10/29/2013 for complete details.  Patient was evaluated by Dr. Rayann Gould in January for chest pain and shortness of breath.  Myoview was abnormal and cardiac catheterization was recommended. Patient was admitted 1/9-1/10. Cardiac catheterization demonstrated high-grade disease in the second diagonal which was treated with cutting balloon angioplasty.  Patient was to have GI evaluation in the near future (EGD and colonoscopy).  She had a recent history of dysphagia as well as rectal bleeding with prior history of adenomatous polyps and in situ carcinoma.  Therefore, long term commitment to dual antiplatelet Rx was to be avoided.    She has noted recurrent left-sided chest discomfort. She feels as though that this is indigestion. She has had this for years without significant change. She is fairly sedentary. She cannot tell me if she has had significant improvement with any symptoms since her angioplasty. She denies associated dyspnea. She denies exertional chest discomfort. She does take Gas-X for her chest discomfort with relief.  Overall, she describes NYHA class 2-2b symptoms.  She denies syncope. She denies significant pedal edema.  She is requesting clearance to undergo epidural steroid injections as well as EGD/colonoscopy.  Echocardiogram (10/14/13): Mild LVH, mild focal basal hypertrophy of the septum, EF 60-65%,  normal wall motion, grade 1 diastolic dysfunction, MAC, mild LAE, atrial septal aneurysm, PASP 34. There was a possible oscillating density on the mitral valve on parasternal long axis views (related to MAC).   LexiScan Myoview (10/20/13): Intermediate risk; medium-size reversible defect in the mid LAD territory, EF 68%. LHC (10/31/13): Proximal D2 95, mid circumflex 40, LVEDP 10.  PCI:  Cutting Balloon angioplasty to D2.  The patient has a long history of multiple polyps in her cecum and colon, with history of a malignant polyp many years ago. She just underwent a colonoscopy and EGD with finding of 3 polyps with removal of 2 and partial removal of a large 1. Biopsy showed tubular adenoma and she has a scheduled followup appointment with Dr. Maurene Gould to discuss further management.  She otherwise reports stable chest pain and back pain that's not related to exertion and is relieved by antacids. She denies any shortness of breath, palpitation or syncope.  06/16/2014 - this is a 3 months followup, in the interim the patient underwent EGD and colonoscopy with removal of multiple small and 1 large polyp that was associated with prolonged abdominal pain. The patient states that she didn't have bleeding but is persuaded that the pain resolved with Bactrim. She has been seeing Dr. Maurene Gould for this. She has noticed also on and off chest pain and back pain pretty much similar to the symptoms she had prior she received her stent to diagonal artery a year ago. She has stopped taking Plavix prior to her colonoscopy and hasn't restarted yet. She has stable  exertional dyspnea no palpitations or syncope.  09/01/15 - the patient is coming after 6 months, she has underwent a partial colectomy for malignant polyp earlier this year and is till recovering, shefeels tired and has some worsening SOB. No chest pain, no palpitations or syncope. No orthostatic hypotension. She complains that periodically her lips turns blue.    Recent  Labs: 09/01/2015: ALT 10; Creat 0.90; Hemoglobin 12.8; Potassium 4.8; TSH 1.308  Wt Readings from Last 3 Encounters:  10/07/15 197 lb (89.359 kg)  09/14/15 202 lb (91.627 kg)  09/01/15 202 lb (91.627 kg)     Past Medical History  Diagnosis Date  . Hyperlipidemia     a. patient unwilling to use statins.  . Osteoporosis   . DVT (deep venous thrombosis) ([place]) 12/2007  . PE (pulmonary embolism) 12/2007    a. PE/DVT after neck surgery 2009. b. coumadin d/c 10-2008.  . Pulmonary nodule     incidental per CT:  Pet scan 4-9: likely benign, CT 08-2009 no change, no further CTs ([name])  . Hemorrhoids   . IBS (irritable bowel syndrome)   . Diverticulosis   . PPD positive   . Macular degeneration 03/2009    [name]  . Allergic rhinitis   . Dyspnea     a. Chronic, extensive w/u see OV note 09-2010. b. [employer] 2011: ormal with RA 5 RV 31/2 PA 27/12 (19) PCW 10 CO normal. No evidence of shunting with sitting up in cath lab. c. CPX 2011: see report. d. Prior fluoro of diaphragm -  R diaphragm elevated at rest but both moved with inspiration.   Marland Kitchen Spinal stenosis   . GERD (gastroesophageal reflux disease)     a. Hx GERD/esophageal dysmotility followed by [name].   Marland Kitchen Hypertension   . Neuropathy ([place])     a. Hands, feet, legs.  . Orthostatic hypotension   . CAD (coronary artery disease)     a. Nonobst in 2011. b. Abnormal nuc 09/2013 -> s/p cutting balloon to D2; mild LAD disease 10/31/13.  . Osteomyelitis ([place])     a. Adm 04/2013: Charcot collapse of the right foot with osteomyelitis and ulceration, s/p excision.  . Venous insufficiency     a. Contributing to LEE.  . Type II diabetes mellitus ([place])   . DJD (degenerative joint disease)   . Arthritis     "back; ankles; hands; knees" (10/31/2013)  . Recurrent UTI   . 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 1994 - malignant polyp removed during colonoscopy.  . Hiatal hernia     a. s/p Nissen fundoplication [id number].  Marland Kitchen RSD (reflex sympathetic  dystrophy)     a. Chronic pain.  . Abnormal echocardiogram     09/2013: mild LVH, mild focal basal hypertrophy of septum, EF 60-65%, normal WM, grade 1 diastolic dysfunction, MAC, mild LAE, ASA, PASP 34. Possible oscillating MV density - reviewed by MD and felt there was no significant abnormality other than MAC noted with mitral valve and did not require TEE.  . Adenomatous colon polyp   . Pneumonia   . Fatty liver   . PONV (postoperative nausea and vomiting) 2009    neck surgery    Current Outpatient Prescriptions  Medication Sig Dispense Refill  . Alpha-Lipoic Acid 600 MG CAPS Take 600 mg by mouth 2 (two) times daily.    Marland Kitchen amLODipine (NORVASC) 2.5 MG tablet Take 1 tablet (2.5 mg total) by mouth daily. 180 tablet 3  .  aspirin EC 81 MG tablet Take 1 tablet (81 mg total) by mouth daily. (Patient taking differently: Take 81 mg by mouth at bedtime. )    . Cholecalciferol (VITAMIN D3) 5000 UNITS CAPS Take 5,000 Units by mouth every evening.    . Cyanocobalamin (VITAMIN B-12) 2500 MCG SUBL Place 2,500 mcg under the tongue every evening.     . diclofenac sodium (VOLTAREN) 1 % GEL Apply 1 application topically daily as needed (pain).    Marland Kitchen erythromycin ophthalmic ointment Place 1 application into both eyes daily.    Marland Kitchen glucose blood (FREESTYLE LITE) test strip Check blood sugar no more than twice daily 100 each 12  . HYDROcodone-acetaminophen (NORCO) 10-325 MG tablet Take 1 tablet by mouth every 6 (six) hours as needed for moderate pain or severe pain. 120 tablet 0  . hyoscyamine (LEVSIN/SL) 0.125 MG SL tablet Place 1 tablet (0.125 mg total) under the tongue every 4 (four) hours as needed. 30 tablet 1  . Lancets (FREESTYLE) lancets Check blood sugar twice daily. 100 each 12  . lidocaine (LIDODERM) 5 % Place 1 patch onto the skin daily. Remove & Discard patch within 12 hours or as directed by MD 30 patch 0  . lisinopril (PRINIVIL,ZESTRIL) 10 MG tablet Take 1 tablet (10 mg total) by mouth daily. 90  tablet 3  . meclizine (ANTIVERT) 12.5 MG tablet Take 1 tablet (12.5 mg total) by mouth 3 (three) times daily. 90 tablet 1  . metoprolol succinate (TOPROL-XL) 25 MG 24 hr tablet TAKE 1 TABLET BY MOUTH ONCE DAILY 30 tablet 5  . nabumetone (RELAFEN) 750 MG tablet Take 750 mg by mouth 2 (two) times daily.    Marland Kitchen NITROSTAT 0.4 MG SL tablet place 1 tablet under the tongue if needed every 5 minutes for chest pain 100 tablet 1  . nortriptyline (PAMELOR) 10 MG capsule Take 10 mg by mouth every evening.     Marland Kitchen omeprazole (PRILOSEC) 20 MG capsule Take 1 capsule (20 mg total) by mouth 2 (two) times daily. 180 capsule 0  . OVER THE COUNTER MEDICATION Take 1 capsule by mouth 2 (two) times daily. Integrative Digestive Formula    . Polyvinyl Alcohol-Povidone (REFRESH OP) Place 1 drop into both eyes daily as needed (dry eyes).    . ranitidine (ZANTAC) 150 MG tablet Take 150 mg by mouth every evening.    Marland Kitchen tiZANidine (ZANAFLEX) 4 MG tablet Take 4 mg by mouth every 8 (eight) hours as needed for muscle spasms.    Marland Kitchen trimethoprim (TRIMPEX) 100 MG tablet Take 100 mg by mouth every other day.     No current facility-administered medications for this visit.    Allergies:   Cymbalta; Gabapentin; Adhesive; Cefazolin; Ciprofloxacin; Clorazepate dipotassium; Darifenacin hydrobromide; Dilaudid; Doxycycline; Enablex; Levofloxacin; Lyrica; Methadone hcl; Morphine and related; Pentazocine lactate; and Talwin   Social History:  The patient  reports that she has never smoked. She has never used smokeless tobacco. She reports that she does not drink alcohol or use illicit drugs.   Family History:  The patient's family history includes Diabetic kidney disease in her daughter; Headache in her sister; Heart disease in her mother; Migraines in her sister; Rheum arthritis in her mother. There is no history of Colon cancer, Esophageal cancer, Stomach cancer, or Rectal cancer.   ROS:  Please see the history of present illness.   All other  systems reviewed and negative.   PHYSICAL EXAM: VS:  BP 122/64 mmHg  Pulse 77  Ht 5\' 3"  (  1.6 m)  Wt 197 lb (89.359 kg)  BMI 34.91 kg/m2 Well nourished, well developed, in no acute distress HEENT: normal Neck: no JVD at 90 Cardiac:  normal S1, S2; RRR; no murmur Lungs:  clear to auscultation bilaterally, no wheezing, rhonchi or rales Abd: soft, nontender, no hepatomegaly Ext: trace bilateral LE edema right wrist without hematoma or mass  Skin: warm and dry Neuro:  CNs 2-12 intact, no focal abnormalities noted  EKG:  NSR, HR 74, normal axis, nonspecific ST-T wave changes  Nuclear stress test 09/14/2015  Nuclear stress EF: 71%. Normal LV function  There was no ST segment deviation noted during stress.  The study is normal. No ischemia. No evidence of MI .  This is a low risk study.     ASSESSMENT AND PLAN:  1. Chest Pain:  Negative stress test 1 year ago ans the last months, she is deconditioned sec to her ortho limitations, she is advised to join Silver sneakers and try non-weight bearing exercise like water aerobic  Or stationary bike. 2. CAD: She will continue aspirin and beta blocker. she is adamant about not taking statins. Off Plavix now.  3. Hypertension:  Controlled. 4. Hyperlipidemia - she is adamant about not taking statins. 5. Disposition:  F/u  In 6 months.    Signed, Dorothy Spark, MD  10/07/2015 11:45 AM  z

## 2015-10-11 NOTE — Telephone Encounter (Signed)
Advise patient, they are requiring a very detailed exam, she is due for office visit, please arrange. We'll do paperwork then

## 2015-10-11 NOTE — Telephone Encounter (Signed)
w/DrPaz

## 2015-10-11 NOTE — Telephone Encounter (Signed)
Please call pt and schedule an appt with Dr. Larose Kells. Thanks, JG//CMA

## 2015-10-11 NOTE — Telephone Encounter (Signed)
Scheduled pt's appt for this Friday at 11:00a

## 2015-10-15 ENCOUNTER — Ambulatory Visit (HOSPITAL_BASED_OUTPATIENT_CLINIC_OR_DEPARTMENT_OTHER)
Admission: RE | Admit: 2015-10-15 | Discharge: 2015-10-15 | Disposition: A | Discharge status: Home / Self Care | Payer: Medicare Other | Source: Ambulatory Visit | Attending: Internal Medicine | Admitting: Internal Medicine

## 2015-10-15 ENCOUNTER — Encounter: Payer: Self-pay | Admitting: Internal Medicine

## 2015-10-15 ENCOUNTER — Other Ambulatory Visit: Payer: Self-pay | Admitting: Internal Medicine

## 2015-10-15 ENCOUNTER — Ambulatory Visit (INDEPENDENT_AMBULATORY_CARE_PROVIDER_SITE_OTHER): Payer: Medicare Other | Admitting: Internal Medicine

## 2015-10-15 VITALS — BP 124/76 | HR 76 | Temp 97.9°F | Ht 63.0 in | Wt 190.0 lb

## 2015-10-15 DIAGNOSIS — E1142 Type 2 diabetes mellitus with diabetic polyneuropathy: Secondary | ICD-10-CM | POA: Diagnosis not present

## 2015-10-15 DIAGNOSIS — R0789 Other chest pain: Secondary | ICD-10-CM | POA: Diagnosis not present

## 2015-10-15 DIAGNOSIS — W19XXXA Unspecified fall, initial encounter: Secondary | ICD-10-CM | POA: Diagnosis not present

## 2015-10-15 DIAGNOSIS — Z23 Encounter for immunization: Secondary | ICD-10-CM | POA: Diagnosis not present

## 2015-10-15 DIAGNOSIS — M546 Pain in thoracic spine: Secondary | ICD-10-CM | POA: Insufficient documentation

## 2015-10-15 DIAGNOSIS — R0781 Pleurodynia: Secondary | ICD-10-CM | POA: Insufficient documentation

## 2015-10-15 DIAGNOSIS — Z09 Encounter for follow-up examination after completed treatment for conditions other than malignant neoplasm: Secondary | ICD-10-CM

## 2015-10-15 DIAGNOSIS — I2510 Atherosclerotic heart disease of native coronary artery without angina pectoris: Secondary | ICD-10-CM | POA: Diagnosis not present

## 2015-10-15 DIAGNOSIS — M25551 Pain in right hip: Secondary | ICD-10-CM | POA: Diagnosis not present

## 2015-10-15 DIAGNOSIS — G9050 Complex regional pain syndrome I, unspecified: Secondary | ICD-10-CM

## 2015-10-15 DIAGNOSIS — M4856XA Collapsed vertebra, not elsewhere classified, lumbar region, initial encounter for fracture: Secondary | ICD-10-CM | POA: Insufficient documentation

## 2015-10-15 DIAGNOSIS — G629 Polyneuropathy, unspecified: Secondary | ICD-10-CM

## 2015-10-15 LAB — HEMOGLOBIN A1C: Hgb A1c MFr Bld: 7.6 % — ABNORMAL HIGH (ref 4.6–6.5)

## 2015-10-15 MED ORDER — HYDROCODONE-ACETAMINOPHEN 10-325 MG PO TABS: 1.0000 | ORAL_TABLET | Freq: Four times a day (QID) | ORAL | Status: DC | PRN

## 2015-10-15 MED ORDER — METFORMIN HCL 850 MG PO TABS
ORAL_TABLET | ORAL | Status: DC
Start: 2015-10-15 — End: 2015-12-10

## 2015-10-15 NOTE — Patient Instructions (Signed)
BEFORE YOU LEAVE THE OFFICE:  GO TO THE LAB  Get the blood work    GO TO THE FRONT DESK Schedule a routine office visit or check up to be done in  3 months No  fasting   Front desk:    30      AFTER YOU LEAVE THE OFFICE:  Stop by the first floor and get the XR

## 2015-10-15 NOTE — Progress Notes (Signed)
Pre visit review using our clinic review tool, if applicable. No additional management support is needed unless otherwise documented below in the visit note. 

## 2015-10-15 NOTE — Telephone Encounter (Signed)
Paperwork completed, forwarded to [employer] for processing.

## 2015-10-15 NOTE — Progress Notes (Signed)
Subjective:    Patient ID: [name], female    DOB: 04-Dec-1934, 80 y.o.   MRN: [id number]  DOS:  10/15/2015 Type of visit - description : Routine visit Interval history:  neuropathy: Needs paperwork completed. Pain management: Needs to signae contract, needs a refill on pain medication  Diabetes: Recently, blood sugar was in the 270s. Currently taking no medications specific for diabetes Also, few days ago had a fall at home, fell from 6 or 7 stairs, having right-sided pain since then, mostly at the posterior chest and back. HTN: Good compliance of medication, BP today is very good  Review of Systems Denies chest pain or difficulty breathing. No nausea, vomiting, diarrhea. No hematuria after the fall, no abdominal pain. No headache, neck pain at baseline.   Past Medical History  Diagnosis Date  . Hyperlipidemia     a. patient unwilling to use statins.  . Osteoporosis   . DVT (deep venous thrombosis) ([place]) 12/2007  . PE (pulmonary embolism) 12/2007    a. PE/DVT after neck surgery 2009. b. coumadin d/c 10-2008.  . Pulmonary nodule     incidental per CT:  Pet scan 4-9: likely benign, CT 08-2009 no change, no further CTs (Dr. Gwenette Greet)  . Hemorrhoids   . IBS (irritable bowel syndrome)   . Diverticulosis   . PPD positive   . Macular degeneration 03/2009    Dr. Rosana Hoes  . Allergic rhinitis   . Dyspnea     a. Chronic, extensive w/u see OV note 09-2010. b. [place] 2011: ormal with RA 5 RV 31/2 PA 27/12 (19) PCW 10 CO normal. No evidence of shunting with sitting up in cath lab. c. CPX 2011: see report. d. Prior fluoro of diaphragm -  R diaphragm elevated at rest but both moved with inspiration.   Marland Kitchen Spinal stenosis   . GERD (gastroesophageal reflux disease)     a. Hx GERD/esophageal dysmotility followed by Dr. Olevia Perches.   Marland Kitchen Hypertension   . Neuropathy ([place])     a. Hands, feet, legs.  . Orthostatic hypotension   . CAD (coronary artery disease)     a. Nonobst in 2011. b. Abnormal nuc  09/2013 -> s/p cutting balloon to D2; mild LAD disease 10/31/13.  . Osteomyelitis ([place])     a. Adm 04/2013: Charcot collapse of the right foot with osteomyelitis and ulceration, s/p excision.  . Venous insufficiency     a. Contributing to LEE.  . Type II diabetes mellitus ([place])   . DJD (degenerative joint disease)   . Arthritis     "back; ankles; hands; knees" (10/31/2013)  . Recurrent UTI   . 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 1994 - malignant polyp removed during colonoscopy.  . Hiatal hernia     a. s/p Nissen fundoplication [id number].  Marland Kitchen RSD (reflex sympathetic dystrophy)     a. Chronic pain.  . Abnormal echocardiogram     09/2013: mild LVH, mild focal basal hypertrophy of septum, EF 60-65%, normal WM, grade 1 diastolic dysfunction, MAC, mild LAE, ASA, PASP 34. Possible oscillating MV density - reviewed by MD and felt there was no significant abnormality other than MAC noted with mitral valve and did not require TEE.  . Adenomatous colon polyp   . Pneumonia   . Fatty liver   . PONV (postoperative nausea and vomiting) 2009    neck surgery    Past Surgical History  Procedure Laterality Date  . Nissen fundoplication  [id number]  .  Cholecystectomy  03/2002  . Cataract extraction w/ intraocular lens  implant, bilateral  2004    feb 2004 left, aug 2004 right  . Rotator cuff repair Right 07/2007    Dr. Percell Miller  . Anterior cervical decomp/discectomy fusion  12/18/07    For OA,  Dr. Lorin Mercy:  fu by a PE  . Toe amputation Right 08/2008    3rd toe, Dr. Sharol Given due to osteomyelitis  . Nasal septum surgery  09/1963  . Vein ligation Bilateral 03/1966  . Carpal tunnel release Left 09/2011  . Amputation  03/06/2012    Procedure: AMPUTATION FOOT;  Surgeon: Newt Minion, MD;  Location: [place] [facility];  Service: Orthopedics;  Laterality: Left;  FIFTH RAY AMPUTATION   . Vena cava filter placement  01/2010    green filter; "due to blood clots"  . Ankle fusion  09/27/2012    Procedure: ANKLE FUSION;  Surgeon: Newt Minion, MD;  Location: [place];  Service: Orthopedics;  Laterality: Left;  Left Tibiocalcaneal Fusion  . Ankle fusion Right 05/09/2013    Procedure: ANKLE FUSION;  Surgeon: Newt Minion, MD;  Location: [place];  Service: Orthopedics;  Laterality: Right;  Excision Osteomyelitis Base 1st MT Right Foot, Fusion Medial Column  . Cardiac catheterization  05/2010     [facility]  . Coronary angioplasty  10/31/2013  . Vaginal hysterectomy  03/1975  . Ankle surgery Left apr & june 1991  . Cyst removal hand  06/2003  . Rotator cuff repair Left 11/2004  . Wisdom tooth extraction  06/2007  . Foot bone excision Right 06/2009  . Left heart catheterization with coronary angiogram N/A 10/31/2013    Procedure: LEFT HEART CATHETERIZATION WITH CORONARY ANGIOGRAM;  Surgeon: Jettie Booze, MD;  Location: [facility] CATH LAB;  Service: Cardiovascular;  Laterality: N/A;  . Percutaneous coronary intervention-balloon only  10/31/2013    Procedure: PERCUTANEOUS CORONARY INTERVENTION-BALLOON ONLY;  Surgeon: Jettie Booze, MD;  Location: [facility] CATH LAB;  Service: Cardiovascular;;  . Laparoscopic right hemi colectomy N/A 01/08/2015    Procedure: LAPAROSCOPIC ASSISTED RIGHT HEMI COLECTOMY;  Surgeon: Johnathan Hausen, MD;  Location: [facility] ORS;  Service: General;  Laterality: N/A;    Social History   Social History  . Marital Status: Divorced    Spouse Name: N/A  . Number of Children: 3  . Years of Education: N/A   Occupational History  . Retired      from [employer]    Social History Main Topics  . Smoking status: Never Smoker   . Smokeless tobacco: Never Used     Comment: tried for a few months in college.   . Alcohol Use: No  . Drug Use: No  . Sexual Activity: No   Other Topics Concern  . Not on file   Social History Narrative   1 daughter lives w/ her , another daughter lives in town, 1 son in [place]                  Medication List       This list is accurate as of: 10/15/15 11:59 PM.  Always use your most  recent med list.               Alpha-Lipoic Acid 600 MG Caps  Take 600 mg by mouth 2 (two) times daily.     amLODipine 2.5 MG tablet  Commonly known as:  NORVASC  Take 1 tablet (2.5 mg total) by mouth daily.  aspirin EC 81 MG tablet  Take 1 tablet (81 mg total) by mouth daily.     diclofenac sodium 1 % Gel  Commonly known as:  VOLTAREN  Apply 1 application topically daily as needed (pain).     erythromycin ophthalmic ointment  Place 1 application into both eyes daily.     freestyle lancets  Check blood sugar twice daily.     glucose blood test strip  Commonly known as:  FREESTYLE LITE  Check blood sugar no more than twice daily     HYDROcodone-acetaminophen 10-325 MG tablet  Commonly known as:  NORCO  Take 1 tablet by mouth every 6 (six) hours as needed for moderate pain or severe pain.     HYDROcodone-acetaminophen 10-325 MG tablet  Commonly known as:  NORCO  Take 1 tablet by mouth every 6 (six) hours as needed for moderate pain or severe pain.     hyoscyamine 0.125 MG SL tablet  Commonly known as:  LEVSIN/SL  Place 1 tablet (0.125 mg total) under the tongue every 4 (four) hours as needed.     lidocaine 5 %  Commonly known as:  LIDODERM  Place 1 patch onto the skin daily. Remove & Discard patch within 12 hours or as directed by MD     lisinopril 10 MG tablet  Commonly known as:  PRINIVIL,ZESTRIL  Take 1 tablet (10 mg total) by mouth daily.     meclizine 12.5 MG tablet  Commonly known as:  ANTIVERT  Take 1 tablet (12.5 mg total) by mouth 3 (three) times daily.     metFORMIN 850 MG tablet  Commonly known as:  GLUCOPHAGE  Take 1 tablet by mouth daily for 1 week then increase to 1 tablet by mouth twice daily     metoprolol succinate 25 MG 24 hr tablet  Commonly known as:  TOPROL-XL  TAKE 1 TABLET BY MOUTH ONCE DAILY     nabumetone 750 MG tablet  Commonly known as:  RELAFEN  Take 750 mg by mouth 2 (two) times daily.     NITROSTAT 0.4 MG SL tablet    Generic drug:  nitroGLYCERIN  place 1 tablet under the tongue if needed every 5 minutes for chest pain     nortriptyline 10 MG capsule  Commonly known as:  PAMELOR  Take 10 mg by mouth every evening.     omeprazole 20 MG capsule  Commonly known as:  PRILOSEC  Take 1 capsule (20 mg total) by mouth 2 (two) times daily.     OVER THE COUNTER MEDICATION  Take 1 capsule by mouth 2 (two) times daily. Integrative Digestive Formula     ranitidine 150 MG tablet  Commonly known as:  ZANTAC  Take 150 mg by mouth every evening.     REFRESH OP  Place 1 drop into both eyes daily as needed (dry eyes).     tiZANidine 4 MG tablet  Commonly known as:  ZANAFLEX  Take 4 mg by mouth every 8 (eight) hours as needed for muscle spasms.     trimethoprim 100 MG tablet  Commonly known as:  TRIMPEX  Take 100 mg by mouth every other day.     Vitamin B-12 2500 MCG Subl  Place 2,500 mcg under the tongue every evening.     Vitamin D3 5000 UNITS Caps  Take 5,000 Units by mouth every evening.           Objective:   Physical Exam BP 124/76 mmHg  Pulse 76  Temp(Src) 97.9 F (36.6 C) (Oral)  Ht 5\' 3"  (1.6 m)  Wt 190 lb (86.183 kg)  BMI 33.67 kg/m2  SpO2 99% General:   Well developed, well nourished, sitting in a wheelchair, I did not ask her to get up, no distress.  HEENT:  Normocephalic . Face symmetric, atraumatic Lungs:  CTA B Normal respiratory effort, no intercostal retractions, no accessory muscle use. Heart: RRR,  no murmur.  No pretibial edema bilaterally  Diabetic foot exam: S/P toe  amputations, severe deformities throughout, skin without discharge or redness. Pinprick examination: No sensitivity at all. MSK: Left hip rotation normal. Right hip rotation performed palpation seating: Mild pain triggered. + TTP at the right posterior chest and right low back. Neurologic:  alert & oriented X3.  Speech normal, gait not tested  Psych--  Cognition and judgment appear intact.   Cooperative with normal attention span and concentration.  Behavior appropriate. No anxious or depressed appearing.      Assessment & Plan:   Assessment> DM HTN  Hyperlipidemia-- won't take statins  CAD GI: GERD, IBS, colon polyps, PUD, abdominal pain "post polypectomy syndrome" Osteoporosis  MSK,pain mngmt : --Reflex sympathetic dystrophy --Neuropathy --DJD --Spinal stenosis  --H/o Osteomyelitis  Foot --s/p R and L toe amputation -- sees Dr Sharol Given prn Takes nortriptyline also for leg cramps. She had local injections with Dr. Maia Petties 2016 without apparent help. She took oxycodone after surgery and that did NOT seem to help better than hydrocodone. Intolerant to Cymbalta, Neurontin ; Lyrica helped but caused edema. Recurrent UTIs Venous insufficiency  H/o  + PPD H/o hypothyroidism: TSH is stable H/o dyspnea  PLAN: DM: A1c increasing, will check another A1c, we talk about possibly medication, she is willing to try metformin if necessary.  Neuropathy: Multiple deformities, status post surgeries, paperwork completed. RSD: RF pain meds  Fall: XR hip, back, chest, call if sx increase or no gradually better  RTC 3 months

## 2015-10-18 DIAGNOSIS — Z09 Encounter for follow-up examination after completed treatment for conditions other than malignant neoplasm: Secondary | ICD-10-CM | POA: Insufficient documentation

## 2015-10-18 NOTE — Assessment & Plan Note (Signed)
DM: A1c increasing, will check another A1c, we talk about possibly medication, she is willing to try metformin if necessary.  Neuropathy: Multiple deformities, status post surgeries, paperwork completed. Fall: XR hip, back, chest, call if sx increase or no gradually better  RTC 3 months

## 2015-10-20 DIAGNOSIS — M4806 Spinal stenosis, lumbar region: Secondary | ICD-10-CM | POA: Diagnosis not present

## 2015-10-20 DIAGNOSIS — M545 Low back pain: Secondary | ICD-10-CM | POA: Diagnosis not present

## 2015-10-20 DIAGNOSIS — M47816 Spondylosis without myelopathy or radiculopathy, lumbar region: Secondary | ICD-10-CM | POA: Diagnosis not present

## 2015-10-20 DIAGNOSIS — M549 Dorsalgia, unspecified: Secondary | ICD-10-CM | POA: Diagnosis not present

## 2015-10-29 ENCOUNTER — Telehealth: Payer: Self-pay | Admitting: *Deleted

## 2015-10-29 MED ORDER — OMEPRAZOLE 20 MG PO CPDR: 20.0000 mg | DELAYED_RELEASE_CAPSULE | Freq: Two times a day (BID) | ORAL | Status: DC

## 2015-10-29 NOTE — Telephone Encounter (Signed)
Med sent to pharmacy.

## 2015-11-03 ENCOUNTER — Ambulatory Visit: Payer: Medicare Other | Admitting: Cardiology

## 2015-11-04 DIAGNOSIS — Y92099 Unspecified place in other non-institutional residence as the place of occurrence of the external cause: Secondary | ICD-10-CM | POA: Diagnosis not present

## 2015-11-04 DIAGNOSIS — M5136 Other intervertebral disc degeneration, lumbar region: Secondary | ICD-10-CM | POA: Diagnosis not present

## 2015-11-04 DIAGNOSIS — L97512 Non-pressure chronic ulcer of other part of right foot with fat layer exposed: Secondary | ICD-10-CM | POA: Diagnosis not present

## 2015-11-04 DIAGNOSIS — Z0000 Encounter for general adult medical examination without abnormal findings: Secondary | ICD-10-CM | POA: Diagnosis not present

## 2015-11-04 DIAGNOSIS — M47816 Spondylosis without myelopathy or radiculopathy, lumbar region: Secondary | ICD-10-CM | POA: Diagnosis not present

## 2015-11-04 DIAGNOSIS — L02612 Cutaneous abscess of left foot: Secondary | ICD-10-CM | POA: Diagnosis not present

## 2015-11-04 DIAGNOSIS — L97511 Non-pressure chronic ulcer of other part of right foot limited to breakdown of skin: Secondary | ICD-10-CM | POA: Diagnosis not present

## 2015-11-04 DIAGNOSIS — M2041 Other hammer toe(s) (acquired), right foot: Secondary | ICD-10-CM | POA: Diagnosis not present

## 2015-11-04 DIAGNOSIS — M4806 Spinal stenosis, lumbar region: Secondary | ICD-10-CM | POA: Diagnosis not present

## 2015-11-04 DIAGNOSIS — Z87448 Personal history of other diseases of urinary system: Secondary | ICD-10-CM | POA: Diagnosis not present

## 2015-11-04 DIAGNOSIS — W19XXXA Unspecified fall, initial encounter: Secondary | ICD-10-CM | POA: Diagnosis not present

## 2015-11-15 DIAGNOSIS — H5315 Visual distortions of shape and size: Secondary | ICD-10-CM | POA: Diagnosis not present

## 2015-11-15 DIAGNOSIS — Z961 Presence of intraocular lens: Secondary | ICD-10-CM | POA: Diagnosis not present

## 2015-11-15 DIAGNOSIS — H40013 Open angle with borderline findings, low risk, bilateral: Secondary | ICD-10-CM | POA: Diagnosis not present

## 2015-11-15 DIAGNOSIS — G43809 Other migraine, not intractable, without status migrainosus: Secondary | ICD-10-CM | POA: Diagnosis not present

## 2015-11-16 ENCOUNTER — Ambulatory Visit
Admission: RE | Admit: 2015-11-16 | Discharge: 2015-11-16 | Disposition: A | Discharge status: Home / Self Care | Payer: Medicare Other | Source: Ambulatory Visit | Attending: Rehabilitation | Admitting: Rehabilitation

## 2015-11-16 ENCOUNTER — Other Ambulatory Visit: Payer: Self-pay | Admitting: Rehabilitation

## 2015-11-16 DIAGNOSIS — M5136 Other intervertebral disc degeneration, lumbar region: Secondary | ICD-10-CM

## 2015-11-16 DIAGNOSIS — M4806 Spinal stenosis, lumbar region: Secondary | ICD-10-CM | POA: Diagnosis not present

## 2015-11-17 ENCOUNTER — Telehealth: Payer: Self-pay | Admitting: Neurology

## 2015-11-17 DIAGNOSIS — M316 Other giant cell arteritis: Secondary | ICD-10-CM | POA: Diagnosis not present

## 2015-11-17 DIAGNOSIS — Z961 Presence of intraocular lens: Secondary | ICD-10-CM | POA: Diagnosis not present

## 2015-11-17 DIAGNOSIS — G43909 Migraine, unspecified, not intractable, without status migrainosus: Secondary | ICD-10-CM | POA: Diagnosis not present

## 2015-11-17 DIAGNOSIS — H40013 Open angle with borderline findings, low risk, bilateral: Secondary | ICD-10-CM | POA: Diagnosis not present

## 2015-11-17 DIAGNOSIS — H5315 Visual distortions of shape and size: Secondary | ICD-10-CM | POA: Diagnosis not present

## 2015-11-17 NOTE — Telephone Encounter (Signed)
Pt's daughter called said she fell toward the end of December 2016 and fell on her backside hitting the back of her head. She had some back issues and was seen by PCP, pt did not have any test of her head. Daughter said pt started seeing flashing lights Monday 11/15/15. She called Dr Bing Plume (her opthomologist) but he could not see her and referred her to Dr Katy Fitch under an emergency visit. Dr Katy Fitch is thinking she had a detached retina. She was seen by Dr Katy Fitch again today and had some test. He sent her to have bloodwork also. He wants her follow up with Dr Bing Plume in 4 mths. He wants her seen by Dr Jaynee Eagles as he is thinking she might be having migraines. Pt was last seen by Dr Krista Blue and does not want to see her. She feels she did not connect with Dr Krista Blue.

## 2015-11-24 ENCOUNTER — Ambulatory Visit (INDEPENDENT_AMBULATORY_CARE_PROVIDER_SITE_OTHER): Payer: Medicare Other | Admitting: Gastroenterology

## 2015-11-24 ENCOUNTER — Other Ambulatory Visit: Payer: Self-pay | Admitting: Cardiology

## 2015-11-24 ENCOUNTER — Encounter: Payer: Self-pay | Admitting: Gastroenterology

## 2015-11-24 VITALS — BP 134/78 | HR 92 | Ht 63.0 in | Wt 199.2 lb

## 2015-11-24 DIAGNOSIS — Z86010 Personal history of colonic polyps: Secondary | ICD-10-CM

## 2015-11-24 DIAGNOSIS — K625 Hemorrhage of anus and rectum: Secondary | ICD-10-CM

## 2015-11-24 DIAGNOSIS — K5900 Constipation, unspecified: Secondary | ICD-10-CM

## 2015-11-24 DIAGNOSIS — K649 Unspecified hemorrhoids: Secondary | ICD-10-CM | POA: Diagnosis not present

## 2015-11-24 MED ORDER — HYDROCORTISONE ACETATE 25 MG RE SUPP: 25.0000 mg | Freq: Every day | RECTAL | Status: DC

## 2015-11-24 MED ORDER — POLYETHYLENE GLYCOL 3350 17 GM/SCOOP PO POWD: ORAL | Status: DC

## 2015-11-24 MED ORDER — NA SULFATE-K SULFATE-MG SULF 17.5-3.13-1.6 GM/177ML PO SOLN: 1.0000 | Freq: Once | ORAL | Status: DC

## 2015-11-24 NOTE — Progress Notes (Signed)
[name]    LP:[id number]    [birth year]  Primary Care Physician:Mary Larose Kells, MD  Referring Physician: Colon Branch, MD [address] [place], [place] [address]  Chief complaint:  BRBPR  HPI:  81 year old white female who is being followed by [name] for the past 30 years 30 years. She had a laparoscopically-assisted right hemicolectomy for carpeted polyp of the cecum in March 2016 by [name]. She is s/p Nissen fundoplication for intractable gastroesophageal reflux.  Last colonoscopy was [facility] in August 2015 at which time cecal polyp was partly removed but the base remained in the Mary. It prompted her referral to surgery for resection. She already has a history of in situ carcinoma of the sigmoid polyp in 1994 with numerous colonoscopies and polypectomies since then. She has chronic low back pain for which she takes hydrocodone 3 times a day . She describes occasional constipation and inability to evacuate. Currently not on any laxatives. She's been having intermittent bright red blood per rectum worse when she strains with constipation    Outpatient Encounter Prescriptions as of 11/24/2015  Medication Sig  . Alpha-Lipoic Acid 600 MG CAPS Take 600 mg by mouth 2 (two) times daily.  Marland Kitchen amLODipine (NORVASC) 2.5 MG tablet Take 1 tablet (2.5 mg total) by mouth daily.  Marland Kitchen aspirin EC 81 MG tablet Take 1 tablet (81 mg total) by mouth daily. (Patient taking differently: Take 81 mg by mouth at bedtime. )  . Cholecalciferol (VITAMIN D3) 5000 UNITS CAPS Take 5,000 Units by mouth every evening.  . Cyanocobalamin (VITAMIN B-12) 2500 MCG SUBL Place 2,500 mcg under the tongue every evening.   . diclofenac sodium (VOLTAREN) 1 % GEL Apply 1 application topically daily as needed (pain).  Marland Kitchen erythromycin ophthalmic ointment Place 1 application into both eyes daily.  Marland Kitchen glucose blood (FREESTYLE LITE) test strip Check blood sugar no more than twice daily (Patient not taking: Reported  on 10/15/2015)  . HYDROcodone-acetaminophen (NORCO) 10-325 MG tablet Take 1 tablet by mouth every 6 (six) hours as needed for moderate pain or severe pain.  Marland Kitchen HYDROcodone-acetaminophen (NORCO) 10-325 MG tablet Take 1 tablet by mouth every 6 (six) hours as needed for moderate pain or severe pain.  . hyoscyamine (LEVSIN/SL) 0.125 MG SL tablet Place 1 tablet (0.125 mg total) under the tongue every 4 (four) hours as needed.  . Lancets (FREESTYLE) lancets Check blood sugar twice daily. (Patient not taking: Reported on 10/15/2015)  . lidocaine (LIDODERM) 5 % Place 1 patch onto the skin daily. Remove & Discard patch within 12 hours or as directed by MD  . lisinopril (PRINIVIL,ZESTRIL) 10 MG tablet Take 1 tablet (10 mg total) by mouth daily.  . meclizine (ANTIVERT) 12.5 MG tablet Take 1 tablet (12.5 mg total) by mouth 3 (three) times daily.  . metFORMIN (GLUCOPHAGE) 850 MG tablet Take 1 tablet by mouth daily for 1 week then increase to 1 tablet by mouth twice daily  . metoprolol succinate (TOPROL-XL) 25 MG 24 hr tablet TAKE 1 TABLET BY MOUTH ONCE DAILY  . nabumetone (RELAFEN) 750 MG tablet Take 750 mg by mouth 2 (two) times daily.  Marland Kitchen NITROSTAT 0.4 MG SL tablet place 1 tablet under the tongue if needed every 5 minutes for chest pain (Patient not taking: Reported on 10/15/2015)  . nortriptyline (PAMELOR) 10 MG capsule Take 10 mg by mouth every evening.   Marland Kitchen omeprazole (PRILOSEC) 20 MG capsule  Take 1 capsule (20 mg total) by mouth 2 (two) times daily.  Marland Kitchen OVER THE COUNTER MEDICATION Take 1 capsule by mouth 2 (two) times daily. Integrative Digestive Formula  . Polyvinyl Alcohol-Povidone (REFRESH OP) Place 1 drop into both eyes daily as needed (dry eyes).  . ranitidine (ZANTAC) 150 MG tablet Take 150 mg by mouth every evening.  Marland Kitchen tiZANidine (ZANAFLEX) 4 MG tablet Take 4 mg by mouth every 8 (eight) hours as needed for muscle spasms.  Marland Kitchen trimethoprim (TRIMPEX) 100 MG tablet Take 100 mg by mouth every other day.    No facility-administered encounter medications on file as of 11/24/2015.    Allergies as of 11/24/2015 - Review Complete 10/15/2015  Allergen Reaction Noted  . Cymbalta [duloxetine hcl] Swelling 09/25/2012  . Gabapentin Swelling 09/25/2012  . Adhesive [tape] Rash 07/17/2011  . Cefazolin Hives 03/06/2012  . Ciprofloxacin Other (See Comments) 01/31/2013  . Clorazepate dipotassium Other (See Comments)   . Darifenacin hydrobromide Other (See Comments) 03/30/2009  . Dilaudid [hydromorphone hcl] Other (See Comments) 03/11/2010  . Doxycycline Rash 11/03/2008  . Enablex [darifenacin hydrobromide er] Other (See Comments) 03/06/2012  . Levofloxacin Other (See Comments) 01/08/2014  . Lyrica [pregabalin] Swelling 05/08/2013  . Methadone hcl Other (See Comments)   . Morphine and related Other (See Comments) 10/31/2013  . Pentazocine lactate Other (See Comments) 07/17/2011  . Talwin [pentazocine] Other (See Comments) 12/04/2012    Past Medical History  Diagnosis Date  . Hyperlipidemia     a. patient unwilling to use statins.  . Osteoporosis   . DVT (deep venous thrombosis) ([place]) 12/2007  . PE (pulmonary embolism) 12/2007    a. PE/DVT after neck surgery 2009. b. coumadin d/c 10-2008.  . Pulmonary nodule     incidental per CT:  Pet scan 4-9: likely benign, CT 08-2009 no change, no further CTs ([name])  . Hemorrhoids   . IBS (irritable bowel syndrome)   . Diverticulosis   . PPD positive   . Macular degeneration 03/2009    [name]  . Allergic rhinitis   . Dyspnea     a. Chronic, extensive w/u see OV note 09-2010. b. [place] 2011: ormal with RA 5 RV 31/2 PA 27/12 (19) PCW 10 CO normal. No evidence of shunting with sitting up in cath lab. c. CPX 2011: see report. d. Prior fluoro of diaphragm -  R diaphragm elevated at rest but both moved with inspiration.   Marland Kitchen Spinal stenosis   . GERD (gastroesophageal reflux disease)     a. Hx GERD/esophageal dysmotility followed by [name].   Marland Kitchen  Hypertension   . Neuropathy ([place])     a. Hands, feet, legs.  . Orthostatic hypotension   . CAD (coronary artery disease)     a. Nonobst in 2011. b. Abnormal nuc 09/2013 -> s/p cutting balloon to D2; mild LAD disease 10/31/13.  . Osteomyelitis ([place])     a. Adm 04/2013: Charcot collapse of the right foot with osteomyelitis and ulceration, s/p excision.  . Venous insufficiency     a. Contributing to LEE.  . Type II diabetes mellitus ([place])   . DJD (degenerative joint disease)   . Arthritis     "back; ankles; hands; knees" (10/31/2013)  . Recurrent UTI   . 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 1994 - malignant polyp removed during colonoscopy.  . Hiatal hernia     a. s/p Nissen fundoplication [id number].  Marland Kitchen RSD (reflex sympathetic dystrophy)  a. Chronic pain.  . Abnormal echocardiogram     09/2013: mild LVH, mild focal basal hypertrophy of septum, EF 60-65%, normal WM, grade 1 diastolic dysfunction, MAC, mild LAE, ASA, PASP 34. Possible oscillating MV density - reviewed by MD and felt there was no significant abnormality other than MAC noted with mitral valve and did not require TEE.  . Adenomatous Mary polyp   . Pneumonia   . Fatty liver   . PONV (postoperative nausea and vomiting) 2009    neck surgery    Past Surgical History  Procedure Laterality Date  . Nissen fundoplication  [id number]  . Cholecystectomy  03/2002  . Cataract extraction w/ intraocular lens  implant, bilateral  2004    feb 2004 left, aug 2004 right  . Rotator cuff repair Right 07/2007    Dr. Percell Miller  . Anterior cervical decomp/discectomy fusion  12/18/07    For OA,  Dr. Lorin Mercy:  fu by a PE  . Toe amputation Right 08/2008    3rd toe, Dr. Sharol Given due to osteomyelitis  . Nasal septum surgery  09/1963  . Vein ligation Bilateral 03/1966  . Carpal tunnel release Left 09/2011  . Amputation  03/06/2012    Procedure: AMPUTATION FOOT;  Surgeon: Newt Minion, MD;  Location: [facility];  Service: Orthopedics;  Laterality: Left;  FIFTH  RAY AMPUTATION   . Vena cava filter placement  01/2010    green filter; "due to blood clots"  . Ankle fusion  09/27/2012    Procedure: ANKLE FUSION;  Surgeon: Newt Minion, MD;  Location: [facility];  Service: Orthopedics;  Laterality: Left;  Left Tibiocalcaneal Fusion  . Ankle fusion Right 05/09/2013    Procedure: ANKLE FUSION;  Surgeon: Newt Minion, MD;  Location: [facility];  Service: Orthopedics;  Laterality: Right;  Excision Osteomyelitis Base 1st MT Right Foot, Fusion Medial Column  . Cardiac catheterization  05/2010     at [facility]  . Coronary angioplasty  10/31/2013  . Vaginal hysterectomy  03/1975  . Ankle surgery Left apr & june 1991  . Cyst removal hand  06/2003  . Rotator cuff repair Left 11/2004  . Wisdom tooth extraction  06/2007  . Foot bone excision Right 06/2009  . Left heart catheterization with coronary angiogram N/A 10/31/2013    Procedure: LEFT HEART CATHETERIZATION WITH CORONARY ANGIOGRAM;  Surgeon: Jettie Booze, MD;  Location: [facility] CATH LAB;  Service: Cardiovascular;  Laterality: N/A;  . Percutaneous coronary intervention-balloon only  10/31/2013    Procedure: PERCUTANEOUS CORONARY INTERVENTION-BALLOON ONLY;  Surgeon: Jettie Booze, MD;  Location: [facility] CATH LAB;  Service: Cardiovascular;;  . Laparoscopic right hemi colectomy N/A 01/08/2015    Procedure: LAPAROSCOPIC ASSISTED RIGHT HEMI COLECTOMY;  Surgeon: Johnathan Hausen, MD;  Location: [facility] ORS;  Service: General;  Laterality: N/A;    Family History  Problem Relation Age of Onset  . Migraines Sister   . Hypertension    . Heart disease Mother     mitral valve replaced  . Rheum arthritis Mother   . Mary cancer Neg Hx   . Esophageal cancer Neg Hx   . Stomach cancer Neg Hx   . Rectal cancer Neg Hx   . Diabetic kidney disease Daughter   . Breast cancer    . Headache Sister     Social History   Social History  . Marital Status: Divorced    Spouse Name: N/A  . Number of Children: 3  . Years of Education: N/A  Occupational History  . Retired      from [employer]    Social History Main Topics  . Smoking status: Never Smoker   . Smokeless tobacco: Never Used     Comment: tried for a few months in college.   . Alcohol Use: No  . Drug Use: No  . Sexual Activity: No   Other Topics Concern  . Not on file   Social History Narrative   1 daughter lives w/ her , another daughter lives in town, 1 son in [place]                Review of systems: Review of Systems  Constitutional: Negative for fever and chills.  HENT: Negative.   Eyes: Negative for blurred vision.  Respiratory: Negative for cough, shortness of breath and wheezing.   Cardiovascular: Negative for chest pain and palpitations.  Gastrointestinal: as per HPI Genitourinary: Negative for dysuria, urgency, frequency and hematuria.  Musculoskeletal: Negative for myalgias, back pain and joint pain.  Skin: Negative for itching and rash.  Neurological: Negative for dizziness, tremors, focal weakness, seizures and loss of consciousness.  Endo/Heme/Allergies: Negative for environmental allergies.  Psychiatric/Behavioral: Negative for depression, suicidal ideas and hallucinations.  All other systems reviewed and are negative.   Physical Exam: Filed Vitals:   11/24/15 1052  BP: 134/78  Pulse: 92   Gen:      No acute distress HEENT:  EOMI, sclera anicteric Neck:     No masses; no thyromegaly Lungs:    Clear to auscultation bilaterally; normal respiratory effort CV:         Regular rate and rhythm; no murmurs Abd:      + bowel sounds; soft, non-tender; no palpable masses, no distension Ext:    No edema; adequate peripheral perfusion Skin:      Warm and dry; no rash Neuro: alert and oriented x 3 Psych: normal mood and affect Rectal exam: external skin tags, good anal sphincter tone.  Anoscopy: 1 column of large hemorrhoid anteriorly with small ulcer at tip and small amount of blood oozing during exam  Data  Reviewed:  Colonoscopy 01/2014 Large carpeted polyp in cecum-Tubullovillous adenoma 2. Surgical [P], colonoscopy, ascending polyp - POLYPOID COLONIC MUCOSA. NO ADENOMATOUS CHANGE OR MALIGNANCY IDENTIFIED. 3. Surgical [P], colonoscopy, cecum, polyp (2) - TUBULOVILLOUS ADENOMAS. NO HIGH GRADE DYSPLASIA OR INVASIVE MALIGNANCY IDENTIFIED. 4. Surgical [P], colonoscopy, ascending polyp - TUBULAR ADENOMA. NO HIGH GRADE DYSPLASIA OR MALIGNANCY IDENTIFIED.  R hemicolectomy 12/2014 - A 3.5 CM LARGE TUBULAR ADENOMA WITH FOCAL HIGH GRADE DYSPLASIA; NO EVIDENCE OF CARCINOMA. - FOUR TUBULAR ADENOMAS WITH NO EVIDENCE OF HIGH GRADE DYSPLASIA OR MALIGNANCY. - FIFTEEN LYMPH NODES, NEGATIVE FOR ATYPIA OR MALIGNANCY(0/15). - RESECTION MARGINS, NEGATIVE FOR ATYPIA OR MALIGNANCY.  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 Denzil Magnuson , MD [phone] Mon-Fri 8a-5p [phone] after 5p, weekends, holidays

## 2015-11-24 NOTE — Patient Instructions (Signed)
You have been scheduled for a colonoscopy. Please follow written instructions given to you at your visit today.  Please pick up your prep supplies at the pharmacy within the next 1-3 days. If you use inhalers (even only as needed), please bring them with you on the day of your procedure. Your physician has requested that you go to www.startemmi.com and enter the access code given to you at your visit today. This web site gives a general overview about your procedure. However, you should still follow specific instructions given to you by our office regarding your preparation for the procedure.  Follow up in 6 months  Use Miralax 1/2 capful daily We are sending in prescription of Anusol Suppositories for 2 weeks

## 2015-11-26 ENCOUNTER — Ambulatory Visit (INDEPENDENT_AMBULATORY_CARE_PROVIDER_SITE_OTHER): Payer: Medicare Other | Admitting: Neurology

## 2015-11-26 ENCOUNTER — Encounter: Payer: Self-pay | Admitting: Neurology

## 2015-11-26 VITALS — BP 155/92 | HR 88 | Ht 63.0 in | Wt 193.0 lb

## 2015-11-26 DIAGNOSIS — H538 Other visual disturbances: Secondary | ICD-10-CM | POA: Diagnosis not present

## 2015-11-26 DIAGNOSIS — R413 Other amnesia: Secondary | ICD-10-CM | POA: Diagnosis not present

## 2015-11-26 DIAGNOSIS — G44309 Post-traumatic headache, unspecified, not intractable: Secondary | ICD-10-CM | POA: Diagnosis not present

## 2015-11-26 DIAGNOSIS — S0990XA Unspecified injury of head, initial encounter: Secondary | ICD-10-CM | POA: Diagnosis not present

## 2015-11-26 NOTE — Progress Notes (Signed)
[facility]    Provider:  Dr Jaynee Eagles Referring Provider: Colon Branch, MD Primary Care Physician:  [name], MD  CC:  headaches  HPI:  [name] is a 81 y.o. female here as a referral from Dr. Larose Kells for headaches., PMHx spinal stenosis, HTN, neuropathy, CAD, DM2, arthritis, RSD, fatty liver. Patient fell on her steps while helping with groceries and she was flat on her back. She uses a walker and wasn't using it, helping lift groceries. Her head hit the bricks. She did not go to the hospital. This happened mid December. She followed up on her back and neck and all was good. She hasn't felt right since then, a few weeks ago she was in the bathroom and all of a sudden she saw lights and stars blinking on the right side, went on for 30 minutes. It has happened a few times, happened less since them. She saw Dr. Bing Plume and they could not see her so she went to Dr. Katy Fitch. She has a history of migraines when she was a teenager and lasted into her thirties. Sister with very bad migraines. After the lights stop she gets a headache. No double vision, no blurry vision, but she does see visual abnormalities like something running across her vision, she is having more headaches. She is also having dizziness. She has been more irritable since hitting her head, worsening memory problems. She is having blurry vision. She takes hydrocodone daily. She takes advil sometimes not too often. They don't last that long, not that severe 5/10 for 20 minutes.   Reviewed notes, labs and imaging from outside physicians, which showed; She was seen by Dr Katy Fitch for an emergency visit. Her head had been hurting since a fall. She had a "light show" in her right eye with some vision changes. VA was od 20/30, os 20/25, external exam normal, slit lamp nml, fundus nml, glaucoma borderline. He suspected migraine aura. Visual fields were performed. .  crp 4.2, esr 2.   Review of Systems: Patient complains of symptoms  per HPI as well as the following symptoms: blurred vision, eye pain, SOB, feelin ghot, feeling cold. Pertinent negatives per HPI. All others negative.   Social History   Social History  . Marital Status: Divorced    Spouse Name: N/A  . Number of Children: 3  . Years of Education: 15   Occupational History  . Retired      from [employer]    Social History Main Topics  . Smoking status: Never Smoker   . Smokeless tobacco: Never Used     Comment: tried for a few months in college.   . Alcohol Use: No  . Drug Use: No  . Sexual Activity: No   Other Topics Concern  . Not on file   Social History Narrative   Lives: daughter lives w/ her and another daughter lives in town   1 son in [place]   Son comes home every 4 weeks to visit   Caffeine use:  Tea 1/day w/ dinner   Coffee occass              Family History  Problem Relation Age of Onset  . Migraines Sister   . Hypertension    . Heart disease Mother     mitral valve replaced  . Rheum arthritis Mother   . Colon cancer Neg Hx   . Esophageal cancer Neg Hx   . Stomach cancer Neg Hx   .  Rectal cancer Neg Hx   . Diabetic kidney disease Daughter   . Breast cancer    . Headache Sister     Past Medical History  Diagnosis Date  . Hyperlipidemia     a. patient unwilling to use statins.  . Osteoporosis   . DVT (deep venous thrombosis) ([place]) 12/2007  . PE (pulmonary embolism) 12/2007    a. PE/DVT after neck surgery 2009. b. coumadin d/c 10-2008.  . Pulmonary nodule     incidental per CT:  Pet scan 4-9: likely benign, CT 08-2009 no change, no further CTs (Dr. Gwenette Greet)  . Hemorrhoids   . IBS (irritable bowel syndrome)   . Diverticulosis   . PPD positive   . Macular degeneration 03/2009    Dr. Rosana Hoes  . Allergic rhinitis   . Dyspnea     a. Chronic, extensive w/u see OV note 09-2010. b. [place] 2011: ormal with RA 5 RV 31/2 PA 27/12 (19) PCW 10 CO normal. No evidence of shunting with sitting up in cath lab. c. CPX 2011: see  report. d. Prior fluoro of diaphragm -  R diaphragm elevated at rest but both moved with inspiration.   Marland Kitchen Spinal stenosis   . GERD (gastroesophageal reflux disease)     a. Hx GERD/esophageal dysmotility followed by Dr. Olevia Perches.   Marland Kitchen Hypertension   . Neuropathy ([place])     a. Hands, feet, legs.  . Orthostatic hypotension   . CAD (coronary artery disease)     a. Nonobst in 2011. b. Abnormal nuc 09/2013 -> s/p cutting balloon to D2; mild LAD disease 10/31/13.  . Osteomyelitis ([place])     a. Adm 04/2013: Charcot collapse of the right foot with osteomyelitis and ulceration, s/p excision.  . Venous insufficiency     a. Contributing to LEE.  . Type II diabetes mellitus ([place])   . DJD (degenerative joint disease)   . Arthritis     "back; ankles; hands; knees" (10/31/2013)  . Recurrent UTI   . 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 1994 - malignant polyp removed during colonoscopy.  . Hiatal hernia     a. s/p Nissen fundoplication 1572.  Marland Kitchen RSD (reflex sympathetic dystrophy)     a. Chronic pain.  . Abnormal echocardiogram     09/2013: mild LVH, mild focal basal hypertrophy of septum, EF 60-65%, normal WM, grade 1 diastolic dysfunction, MAC, mild LAE, ASA, PASP 34. Possible oscillating MV density - reviewed by MD and felt there was no significant abnormality other than MAC noted with mitral valve and did not require TEE.  . Adenomatous colon polyp   . Pneumonia   . Fatty liver   . PONV (postoperative nausea and vomiting) 2009    neck surgery    Past Surgical History  Procedure Laterality Date  . Nissen fundoplication  03/24/354  . Cholecystectomy  03/2002  . Cataract extraction w/ intraocular lens  implant, bilateral  2004    feb 2004 left, aug 2004 right  . Rotator cuff repair Right 07/2007    Dr. Percell Miller  . Anterior cervical decomp/discectomy fusion  12/18/07    For OA,  Dr. Lorin Mercy:  fu by a PE  . Toe amputation Right 08/2008    3rd toe, Dr. Sharol Given due to osteomyelitis  . Nasal septum surgery   09/1963  . Vein ligation Bilateral 03/1966  . Carpal tunnel release Left 09/2011  . Amputation  03/06/2012    Procedure: AMPUTATION FOOT;  Surgeon: Beverely Low  Fernanda Drum, MD;  Location: [facility];  Service: Orthopedics;  Laterality: Left;  FIFTH RAY AMPUTATION   . Vena cava filter placement  01/2010    green filter; "due to blood clots"  . Ankle fusion  09/27/2012    Procedure: ANKLE FUSION;  Surgeon: Newt Minion, MD;  Location: [facility];  Service: Orthopedics;  Laterality: Left;  Left Tibiocalcaneal Fusion  . Ankle fusion Right 05/09/2013    Procedure: ANKLE FUSION;  Surgeon: Newt Minion, MD;  Location: [facility];  Service: Orthopedics;  Laterality: Right;  Excision Osteomyelitis Base 1st MT Right Foot, Fusion Medial Column  . Cardiac catheterization  05/2010     at [facility]  . Coronary angioplasty  10/31/2013  . Vaginal hysterectomy  03/1975  . Ankle surgery Left apr & june 1991  . Cyst removal hand  06/2003  . Rotator cuff repair Left 11/2004  . Wisdom tooth extraction  06/2007  . Foot bone excision Right 06/2009  . Left heart catheterization with coronary angiogram N/A 10/31/2013    Procedure: LEFT HEART CATHETERIZATION WITH CORONARY ANGIOGRAM;  Surgeon: Jettie Booze, MD;  Location: [facility] CATH LAB;  Service: Cardiovascular;  Laterality: N/A;  . Percutaneous coronary intervention-balloon only  10/31/2013    Procedure: PERCUTANEOUS CORONARY INTERVENTION-BALLOON ONLY;  Surgeon: Jettie Booze, MD;  Location: [facility] C[facility] LAB;  Service: Cardiovascular;;  . Laparoscopic right hemi colectomy N/A 01/08/2015    Procedure: LAPAROSCOPIC ASSISTED RIGHT HEMI COLECTOMY;  Surgeon: Johnathan Hausen, MD;  Location: [facility];  Service: General;  Laterality: N/A;    Current Outpatient Prescriptions  Medication Sig Dispense Refill  . Alpha-Lipoic Acid 600 MG CAPS Take 600 mg by mouth 2 (two) times daily.    Marland Kitchen amLODipine (NORVASC) 2.5 MG tablet Take 1 tablet (2.5 mg total) by mouth daily. 180 tablet 3  . aspirin EC 81 MG tablet  Take 1 tablet (81 mg total) by mouth daily. (Patient taking differently: Take 81 mg by mouth at bedtime. )    . Cholecalciferol (VITAMIN D3) 5000 UNITS CAPS Take 5,000 Units by mouth every evening. Reported on 11/24/2015    . Cyanocobalamin (VITAMIN B-12) 2500 MCG SUBL Place 2,500 mcg under the tongue every evening. Reported on 11/24/2015    . diclofenac sodium (VOLTAREN) 1 % GEL Apply 1 application topically daily as needed (pain).    Marland Kitchen erythromycin ophthalmic ointment Place 1 application into both eyes daily.    Marland Kitchen glucose blood (FREESTYLE LITE) test strip Check blood sugar no more than twice daily 100 each 12  . HYDROcodone-acetaminophen (NORCO) 10-325 MG tablet Take 1 tablet by mouth every 6 (six) hours as needed for moderate pain or severe pain. 120 tablet 0  . hydrocortisone (ANUSOL-HC) 25 MG suppository Place 1 suppository (25 mg total) rectally at bedtime. 14 suppository 2  . hyoscyamine (LEVSIN/SL) 0.125 MG SL tablet Place 1 tablet (0.125 mg total) under the tongue every 4 (four) hours as needed. 30 tablet 1  . Lancets (FREESTYLE) lancets Check blood sugar twice daily. 100 each 12  . lidocaine (LIDODERM) 5 % Place 1 patch onto the skin daily. Remove & Discard patch within 12 hours or as directed by MD 30 patch 0  . lisinopril (PRINIVIL,ZESTRIL) 10 MG tablet Take 1 tablet (10 mg total) by mouth daily. 90 tablet 3  . meclizine (ANTIVERT) 12.5 MG tablet Take 1 tablet (12.5 mg total) by mouth 3 (three) times daily. 90 tablet 1  . metFORMIN (GLUCOPHAGE) 850 MG tablet Take 1  tablet by mouth daily for 1 week then increase to 1 tablet by mouth twice daily 60 tablet 3  . metoprolol succinate (TOPROL-XL) 25 MG 24 hr tablet take 1 tablet by mouth once daily 30 tablet 11  . Na Sulfate-K Sulfate-Mg Sulf (SUPREP BOWEL PREP) SOLN Take 1 kit by mouth once. 1 Bottle 0  . nabumetone (RELAFEN) 750 MG tablet Take 750 mg by mouth 2 (two) times daily.    . nortriptyline (PAMELOR) 10 MG capsule Take 10 mg by mouth  every evening.     Marland Kitchen omeprazole (PRILOSEC) 20 MG capsule Take 1 capsule (20 mg total) by mouth 2 (two) times daily. 180 capsule 3  . OVER THE COUNTER MEDICATION Take 1 capsule by mouth 2 (two) times daily. Integrative Digestive Formula    . polyethylene glycol powder (GLYCOLAX/MIRALAX) powder Use 1/2 capful daily 255 g 11  . Polyvinyl Alcohol-Povidone (REFRESH OP) Place 1 drop into both eyes daily as needed (dry eyes).    . ranitidine (ZANTAC) 150 MG tablet Take 150 mg by mouth every evening.    Marland Kitchen tiZANidine (ZANAFLEX) 4 MG tablet Take 4 mg by mouth every 8 (eight) hours as needed for muscle spasms.    Marland Kitchen trimethoprim (TRIMPEX) 100 MG tablet Take 100 mg by mouth every other day.    Marland Kitchen NITROSTAT 0.4 MG SL tablet place 1 tablet under the tongue if needed every 5 minutes for chest pain (Patient not taking: Reported on 10/15/2015) 100 tablet 1   No current facility-administered medications for this visit.    Allergies as of 11/26/2015 - Review Complete 11/26/2015  Allergen Reaction Noted  . Cymbalta [duloxetine hcl] Swelling 09/25/2012  . Gabapentin Swelling 09/25/2012  . Adhesive [tape] Rash 07/17/2011  . Cefazolin Hives 03/06/2012  . Ciprofloxacin Other (See Comments) 01/31/2013  . Clorazepate dipotassium Other (See Comments)   . Darifenacin hydrobromide Other (See Comments) 03/30/2009  . Dilaudid [hydromorphone hcl] Other (See Comments) 03/11/2010  . Doxycycline Rash 11/03/2008  . Enablex [darifenacin hydrobromide er] Other (See Comments) 03/06/2012  . Levofloxacin Other (See Comments) 01/08/2014  . Lyrica [pregabalin] Swelling 05/08/2013  . Methadone hcl Other (See Comments)   . Morphine and related Other (See Comments) 10/31/2013  . Pentazocine lactate Other (See Comments) 07/17/2011  . Talwin [pentazocine] Other (See Comments) 12/04/2012    Vitals: BP 155/92 mmHg  Pulse 88  Ht 5' 3"  (1.6 m)  Wt 193 lb (87.544 kg)  BMI 34.20 kg/m2  SpO2 92% Last Weight:  Wt Readings from Last  1 Encounters:  11/26/15 193 lb (87.544 kg)   Last Height:   Ht Readings from Last 1 Encounters:  11/26/15 5' 3"  (1.6 m)   Physical exam: Exam: Gen: NAD, conversant, well nourised, obese, well groomed                     CV: RRR, no MRG. No Carotid Bruits.  Eyes: Conjunctivae clear without exudates or hemorrhage  Neuro: Detailed Neurologic Exam  Speech:    Speech is normal; fluent and spontaneous with normal comprehension.  Cognition:    The patient is oriented to person, place, and time;     recent and remote memory intact;     language fluent;     normal attention, concentration,     fund of knowledge Cranial Nerves:    The pupils are equal, round, and reactive to light. The fundi are normal and spontaneous venous pulsations are present. Visual fields are full to finger confrontation. Extraocular  movements are intact. Trigeminal sensation is intact and the muscles of mastication are normal. The face is symmetric. The palate elevates in the midline. Hearing intact. Voice is normal. Shoulder shrug is normal. The tongue has normal motion without fasciculations.   Coordination:    No dysmetria  Gait:    With a walker, slow and mildly antalgic due to right leg RSD with brace  Motor Observation:    No asymmetry, no atrophy, and no involuntary movements noted.   Strength:    Left leg in a brace but strength otherwise strength intact       Sensation: intact to LT     Reflex Exam:  DTR's: cannot test left LL due to RSD but otherwise deep tendon reflexes in the upper extremities brisk and right lower extremity is absent   Toes:    The toes are equiv bilaterally.   Clonus:    Clonus is absent.       Assessment/Plan:  This is a very pleasant 81 year old female here for headaches after hitting her head. She has a Hx of migraines. Feel the headaches are Migrainous with aura  vs post-concussive.  She has a lot of medications, don't feel adding another would be wise. She  already has Zanaflex, recommend taking this for headaches. Will order MRI of the brain and closely follow patient. The headaches appear tolerable and patient is agreeable to this plan.  To prevent or relieve headaches, try the following: Cool Compress. Lie down and place a cool compress on your head.  Avoid headache triggers. If certain foods or odors seem to have triggered your migraines in the past, avoid them. A headache diary might help you identify triggers.  Include physical activity in your daily routine. Try a daily walk or other moderate aerobic exercise.  Manage stress. Find healthy ways to cope with the stressors, such as delegating tasks on your to-do list.  Practice relaxation techniques. Try deep breathing, yoga, massage and visualization.  Eat regularly. Eating regularly scheduled meals and maintaining a healthy diet might help prevent headaches. Also, drink plenty of fluids.  Follow a regular sleep schedule. Sleep deprivation might contribute to headaches Consider biofeedback. With this mind-body technique, you learn to control certain bodily functions - such as muscle tension, heart rate and blood pressure - to prevent headaches or reduce headache pain.    Proceed to emergency room if you experience new or worsening symptoms or symptoms do not resolve, if you have new neurologic symptoms or if headache is severe, or for any concerning symptom.      Mary Ill, MD  [facility] [address] [place], [place] [address]  Phone [phone] Fax [phone]

## 2015-11-26 NOTE — Patient Instructions (Addendum)
Remember to drink plenty of fluid, eat healthy meals and do not skip any meals. Try to eat protein with a every meal and eat a healthy snack such as fruit or nuts in between meals. Try to keep a regular sleep-wake schedule and try to exercise daily, particularly in the form of walking, 20-30 minutes a day, if you can.   As far as your medications are concerned, I would like to suggest: Continue current medications.   As far as diagnostic testing: MRi of the brain  I would like to see you back 3 months, sooner if we need to. Please call us with any interim questions, concerns, problems, updates or refill requests.   Our phone number is [phone]. We also have an after hours call service for urgent matters and there is a physician on-call for urgent questions. For any emergencies you know to call 911 or go to the nearest emergency room

## 2015-12-01 DIAGNOSIS — M47816 Spondylosis without myelopathy or radiculopathy, lumbar region: Secondary | ICD-10-CM | POA: Diagnosis not present

## 2015-12-01 DIAGNOSIS — M545 Low back pain: Secondary | ICD-10-CM | POA: Diagnosis not present

## 2015-12-01 DIAGNOSIS — M5136 Other intervertebral disc degeneration, lumbar region: Secondary | ICD-10-CM | POA: Diagnosis not present

## 2015-12-02 DIAGNOSIS — M2041 Other hammer toe(s) (acquired), right foot: Secondary | ICD-10-CM | POA: Diagnosis not present

## 2015-12-02 DIAGNOSIS — L97511 Non-pressure chronic ulcer of other part of right foot limited to breakdown of skin: Secondary | ICD-10-CM | POA: Diagnosis not present

## 2015-12-02 DIAGNOSIS — L02612 Cutaneous abscess of left foot: Secondary | ICD-10-CM | POA: Diagnosis not present

## 2015-12-02 DIAGNOSIS — L97512 Non-pressure chronic ulcer of other part of right foot with fat layer exposed: Secondary | ICD-10-CM | POA: Diagnosis not present

## 2015-12-08 ENCOUNTER — Telehealth: Payer: Self-pay | Admitting: *Deleted

## 2015-12-08 DIAGNOSIS — M5136 Other intervertebral disc degeneration, lumbar region: Secondary | ICD-10-CM | POA: Diagnosis not present

## 2015-12-08 DIAGNOSIS — Z888 Allergy status to other drugs, medicaments and biological substances status: Secondary | ICD-10-CM | POA: Diagnosis not present

## 2015-12-08 DIAGNOSIS — Z885 Allergy status to narcotic agent status: Secondary | ICD-10-CM | POA: Diagnosis not present

## 2015-12-08 DIAGNOSIS — G8929 Other chronic pain: Secondary | ICD-10-CM | POA: Diagnosis not present

## 2015-12-08 DIAGNOSIS — Z881 Allergy status to other antibiotic agents status: Secondary | ICD-10-CM | POA: Diagnosis not present

## 2015-12-08 DIAGNOSIS — Z91048 Other nonmedicinal substance allergy status: Secondary | ICD-10-CM | POA: Diagnosis not present

## 2015-12-08 DIAGNOSIS — Z79899 Other long term (current) drug therapy: Secondary | ICD-10-CM | POA: Diagnosis not present

## 2015-12-08 DIAGNOSIS — M5440 Lumbago with sciatica, unspecified side: Secondary | ICD-10-CM | POA: Diagnosis not present

## 2015-12-08 DIAGNOSIS — Z5181 Encounter for therapeutic drug level monitoring: Secondary | ICD-10-CM | POA: Diagnosis not present

## 2015-12-08 DIAGNOSIS — M545 Low back pain: Secondary | ICD-10-CM | POA: Diagnosis not present

## 2015-12-08 NOTE — Telephone Encounter (Signed)
Patient notes faxed to [facility] pain [facility].

## 2015-12-09 ENCOUNTER — Other Ambulatory Visit: Payer: Medicare Other

## 2015-12-09 DIAGNOSIS — L97512 Non-pressure chronic ulcer of other part of right foot with fat layer exposed: Secondary | ICD-10-CM | POA: Diagnosis not present

## 2015-12-09 DIAGNOSIS — E11610 Type 2 diabetes mellitus with diabetic neuropathic arthropathy: Secondary | ICD-10-CM | POA: Diagnosis not present

## 2015-12-09 DIAGNOSIS — L97511 Non-pressure chronic ulcer of other part of right foot limited to breakdown of skin: Secondary | ICD-10-CM | POA: Diagnosis not present

## 2015-12-09 DIAGNOSIS — E1142 Type 2 diabetes mellitus with diabetic polyneuropathy: Secondary | ICD-10-CM | POA: Diagnosis not present

## 2015-12-10 ENCOUNTER — Telehealth: Payer: Self-pay | Admitting: Internal Medicine

## 2015-12-10 MED ORDER — SITAGLIPTIN PHOSPHATE 100 MG PO TABS: 100.0000 mg | ORAL_TABLET | Freq: Every day | ORAL | Status: DC

## 2015-12-10 NOTE — Telephone Encounter (Signed)
Spoke w/ Pt, informed her to d/c Metformin and start Januvia 100 mg 1 tablet daily, Rx sent to [facility]. Instructed her to keep scheduled f/u in March. Pt verbalized understanding. Metformin added to intolerance list.

## 2015-12-10 NOTE — Telephone Encounter (Signed)
Pt called stating that the metformin was making her sick. Symptoms included: dizzy, tired, chills, diarrhea, and nausea. Pt said pharmacist advised her to stop taking and notify her PCP. Pt ph# [phone].

## 2015-12-10 NOTE — Telephone Encounter (Signed)
Okay, please discontinue metformin, start Januvia 100 mg one tablet daily #30 and 3 refills. Follow-up is scheduled for March.

## 2015-12-10 NOTE — Telephone Encounter (Signed)
FYI. Please advise.

## 2015-12-13 ENCOUNTER — Telehealth: Payer: Self-pay | Admitting: Internal Medicine

## 2015-12-13 MED ORDER — HYDROCODONE-ACETAMINOPHEN 10-325 MG PO TABS: 1.0000 | ORAL_TABLET | Freq: Four times a day (QID) | ORAL | Status: DC | PRN

## 2015-12-13 NOTE — Telephone Encounter (Signed)
Pt called for refill on hydrocodone. She has 3 days left. She is taking 4/day. Pt said she usually gets 2 months RX at a time. Please call when ready at [phone].

## 2015-12-13 NOTE — Telephone Encounter (Signed)
Rx's printed for February and March 2017, awaiting MD signature.

## 2015-12-13 NOTE — Telephone Encounter (Signed)
Please inform Pt that Rx has been placed at front desk for pick up at her convenience. Thank you.  

## 2015-12-13 NOTE — Telephone Encounter (Signed)
Okay, 2 prescriptions 

## 2015-12-13 NOTE — Telephone Encounter (Signed)
Pt is requesting refill on Hydrocodone.  Last OV: 10/15/2015 Last Fill: 10/15/2015 #120 and 0RF UDS; 08/11/2015 Low risk  Please advise.

## 2015-12-14 NOTE — Telephone Encounter (Signed)
Called pt at # she provided as well as her home phone # where I was told she can't hear me she'll have pt call MD office

## 2015-12-16 ENCOUNTER — Ambulatory Visit
Admission: RE | Admit: 2015-12-16 | Discharge: 2015-12-16 | Disposition: A | Discharge status: Home / Self Care | Payer: Medicare Other | Source: Ambulatory Visit | Attending: Neurology | Admitting: Neurology

## 2015-12-16 DIAGNOSIS — R413 Other amnesia: Secondary | ICD-10-CM | POA: Diagnosis not present

## 2015-12-16 DIAGNOSIS — S0990XA Unspecified injury of head, initial encounter: Secondary | ICD-10-CM

## 2015-12-16 DIAGNOSIS — H538 Other visual disturbances: Secondary | ICD-10-CM | POA: Diagnosis not present

## 2015-12-16 DIAGNOSIS — H539 Unspecified visual disturbance: Secondary | ICD-10-CM | POA: Diagnosis not present

## 2015-12-16 DIAGNOSIS — G44309 Post-traumatic headache, unspecified, not intractable: Secondary | ICD-10-CM

## 2015-12-20 ENCOUNTER — Telehealth: Payer: Self-pay | Admitting: *Deleted

## 2015-12-20 NOTE — Telephone Encounter (Signed)
Tried calling home number. Continued to ring. Unable to LVM. Will try later.

## 2015-12-20 NOTE — Telephone Encounter (Signed)
-----   Message from Melvenia Beam, MD sent at 12/18/2015 12:48 PM EST ----- Terrence Dupont, nothing acute on MRi of the brain to coincide with her head trauma in December. She did not have any damage due to hitting her head, no evidence of bleed or contusion from the fall. She does have chronic white matter change on her brain that have just slightly advanced since 2011. Ask her if she would like to come back into the office so I can show it to her. But these changes are chronic and partially due to aging and partially due to vascular risk factors such as high blood pressure, diabetes cholesterol etc. Thanks

## 2015-12-23 NOTE — Telephone Encounter (Signed)
LVM for pt/daughter to call back about results. Gave [facility] phone number and hours.

## 2015-12-24 NOTE — Telephone Encounter (Signed)
Pt returned Emma's call °

## 2015-12-24 NOTE — Telephone Encounter (Signed)
Terrence Dupont, give her a call at that number on Monday thanks

## 2015-12-24 NOTE — Telephone Encounter (Signed)
Pt called back with a new # [phone]. Please use this to call pt

## 2015-12-27 NOTE — Telephone Encounter (Signed)
Patient returned Emma's call ° °

## 2015-12-27 NOTE — Telephone Encounter (Signed)
Called pt back. Relayed results per Dr Jaynee Eagles note. She verbalized understanding. She is going to call back to schedule f/u if she wants to review imaging with Dr Jaynee Eagles.

## 2015-12-27 NOTE — Telephone Encounter (Signed)
LVM on [phone] for pt to call back. Gave [facility] phone number.

## 2015-12-29 ENCOUNTER — Encounter: Payer: Self-pay | Admitting: Gastroenterology

## 2015-12-29 ENCOUNTER — Ambulatory Visit (AMBULATORY_SURGERY_CENTER): Payer: Medicare Other | Admitting: Gastroenterology

## 2015-12-29 VITALS — BP 141/70 | HR 83 | Temp 96.6°F | Resp 16 | Ht 63.0 in | Wt 199.0 lb

## 2015-12-29 DIAGNOSIS — D124 Benign neoplasm of descending colon: Secondary | ICD-10-CM | POA: Diagnosis not present

## 2015-12-29 DIAGNOSIS — K625 Hemorrhage of anus and rectum: Secondary | ICD-10-CM

## 2015-12-29 DIAGNOSIS — E119 Type 2 diabetes mellitus without complications: Secondary | ICD-10-CM | POA: Diagnosis not present

## 2015-12-29 DIAGNOSIS — K388 Other specified diseases of appendix: Secondary | ICD-10-CM | POA: Diagnosis not present

## 2015-12-29 DIAGNOSIS — K635 Polyp of colon: Secondary | ICD-10-CM | POA: Diagnosis not present

## 2015-12-29 DIAGNOSIS — I10 Essential (primary) hypertension: Secondary | ICD-10-CM | POA: Diagnosis not present

## 2015-12-29 DIAGNOSIS — D123 Benign neoplasm of transverse colon: Secondary | ICD-10-CM | POA: Diagnosis not present

## 2015-12-29 DIAGNOSIS — D122 Benign neoplasm of ascending colon: Secondary | ICD-10-CM | POA: Diagnosis not present

## 2015-12-29 DIAGNOSIS — Z86010 Personal history of colonic polyps: Secondary | ICD-10-CM | POA: Diagnosis not present

## 2015-12-29 DIAGNOSIS — K921 Melena: Secondary | ICD-10-CM | POA: Diagnosis not present

## 2015-12-29 LAB — GLUCOSE, CAPILLARY
Glucose-Capillary: 154 mg/dL — ABNORMAL HIGH (ref 65–99)
Glucose-Capillary: 147 mg/dL — ABNORMAL HIGH (ref 65–99)

## 2015-12-29 MED ORDER — SODIUM CHLORIDE 0.9 % IV SOLN: 500.0000 mL | INTRAVENOUS | Status: DC

## 2015-12-29 NOTE — Progress Notes (Signed)
Called to room to assist during endoscopic procedure.  Patient ID and intended procedure confirmed with present staff. Received instructions for my participation in the procedure from the performing physician.  

## 2015-12-29 NOTE — Op Note (Signed)
[place] Patient Name: [name] Procedure Date: 12/29/2015 2:58 PM MRN: [id number] Endoscopist: Mauri Pole , MD Age: 81 Referring MD:  Date of Birth: 11-Mar-1935 Gender: Female Procedure:            Colonoscopy Indications:          High risk colon cancer surveillance: Personal history                        of colonic polyps Medicines:            Monitored Anesthesia Care Procedure:            Pre-Anesthesia Assessment:                       - Prior to the procedure, a History and Physical was                        performed, and patient medications and allergies were                        reviewed. The patient's tolerance of previous                        anesthesia was also reviewed. The risks and benefits of                        the procedure and the sedation options and risks were                        discussed with the patient. All questions were                        answered, and informed consent was obtained.                        Anticoagulants: The patient has taken aspirin. It was                        decided not to withhold this medication prior to                        procedure. ASA Grade Assessment: III - A patient with                        severe systemic disease. After reviewing the risks and                        benefits, the patient was deemed in satisfactory                        condition to undergo the procedure.                       After obtaining informed consent, the colonoscope was                        passed under direct vision. Throughout the procedure,                        the patient's blood pressure, pulse, and  oxygen                        saturations were monitored continuously. The Model                        CF-[id number]) scope was introduced through the                        anus and advanced to the the ileocolonic anastomosis.                        The quality of the bowel preparation was good.  The                        terminal ileum, ileocecal valve, appendiceal orifice,                        and rectum were photographed. Scope In: 3:03:48 PM Scope Out: 3:26:40 PM Scope Withdrawal Time: 0 hours 17 minutes 46 seconds  Total Procedure Duration: 0 hours 22 minutes 52 seconds  Findings:      The digital rectal exam findings include non-thrombosed external       hemorrhoids.      A 7 mm polyp was found in the ascending colon. The polyp was sessile.       The polyp was removed with a cold snare. Resection and retrieval were       complete.      A 5 mm polyp was found in the transverse colon. The polyp was sessile.       The polyp was removed with a cold biopsy forceps. Resection and       retrieval were complete.      A 8 mm polyp was found in the descending colon. The polyp was       pedunculated and actively oozing blood. The polyp was removed and       controlled bleeding with a hot snare. Resection and retrieval were       complete. ?nodule in appendicieal orifice biopsies obtained      Multiple small and large-mouthed diverticula were found in the sigmoid       colon and descending colon.      The neo-terminal ileum appeared normal.      Non-bleeding internal hemorrhoids were found during retroflexion. The       hemorrhoids were medium-sized. Complications:        No immediate complications. Estimated Blood Loss: Estimated blood loss was minimal. Impression:           - Non-thrombosed external hemorrhoids found on digital                        rectal exam.                       - One 7 mm polyp in the ascending colon, removed with a                        cold snare. Resected and retrieved.                       - One 5 mm polyp in the transverse colon, removed with  a cold biopsy forceps. Resected and retrieved.                       - One 8 mm polyp in the descending colon actively                        oozing blood, controlled bleeding and  removed with a                        hot snare. Resected and retrieved.                       - Diverticulosis in the sigmoid colon and in the                        descending colon. ?nodule in appendicieal orifice                        biopsies obtained                       - The examined portion of the ileum was normal.                       - Non-bleeding internal hemorrhoids. Recommendation:       - Patient has a contact number available for                        emergencies. The signs and symptoms of potential                        delayed complications were discussed with the patient.                        Return to normal activities tomorrow. Written discharge                        instructions were provided to the patient.                       - Resume previous diet.                       - Continue present medications.                       - Await pathology results.                       - Repeat colonoscopy in 3 years for surveillance.                       - Return to GI clinic PRN. Procedure Code(s):    --- Professional ---                       [phone], Colonoscopy, flexible; with removal of tumor(s),                        polyp(s), or other lesion(s) by snare technique  45380, 59, Colonoscopy, flexible; with biopsy, single                        or multiple CPT copyright 2016 [employer] Medical Association. All rights reserved. Mauri Pole, MD 12/29/2015 3:38:12 PM This report has been signed electronically. Number of Addenda: 0

## 2015-12-29 NOTE — Progress Notes (Signed)
Patient awakening,vss,report to rn 

## 2015-12-29 NOTE — Patient Instructions (Signed)
Discharge instructions given. Handouts on polyps,diverticulosis and hemorrhoids. Resume previous medications. YOU HAD AN ENDOSCOPIC PROCEDURE TODAY AT THE [facility]:   Refer to the procedure report that was given to you for any specific questions about what was found during the examination.  If the procedure report does not answer your questions, please call your gastroenterologist to clarify.  If you requested that your care partner not be given the details of your procedure findings, then the procedure report has been included in a sealed envelope for you to review at your convenience later.  YOU SHOULD EXPECT: Some feelings of bloating in the abdomen. Passage of more gas than usual.  Walking can help get rid of the air that was put into your GI tract during the procedure and reduce the bloating. If you had a lower endoscopy (such as a colonoscopy or flexible sigmoidoscopy) you may notice spotting of blood in your stool or on the toilet paper. If you underwent a bowel prep for your procedure, you may not have a normal bowel movement for a few days.  Please Note:  You might notice some irritation and congestion in your nose or some drainage.  This is from the oxygen used during your procedure.  There is no need for concern and it should clear up in a day or so.  SYMPTOMS TO REPORT IMMEDIATELY:   Following lower endoscopy (colonoscopy or flexible sigmoidoscopy):  Excessive amounts of blood in the stool  Significant tenderness or worsening of abdominal pains  Swelling of the abdomen that is new, acute  Fever of 100F or higher   For urgent or emergent issues, a gastroenterologist can be reached at any hour by calling [phone].   DIET: Your first meal following the procedure should be a small meal and then it is ok to progress to your normal diet. Heavy or fried foods are harder to digest and may make you feel nauseous or bloated.  Likewise, meals heavy in dairy and  vegetables can increase bloating.  Drink plenty of fluids but you should avoid alcoholic beverages for 24 hours.  ACTIVITY:  You should plan to take it easy for the rest of today and you should NOT DRIVE or use heavy machinery until tomorrow (because of the sedation medicines used during the test).    FOLLOW UP: Our staff will call the number listed on your records the next business day following your procedure to check on you and address any questions or concerns that you may have regarding the information given to you following your procedure. If we do not reach you, we will leave a message.  However, if you are feeling well and you are not experiencing any problems, there is no need to return our call.  We will assume that you have returned to your regular daily activities without incident.  If any biopsies were taken you will be contacted by phone or by letter within the next 1-3 weeks.  Please call us at [phone] if you have not heard about the biopsies in 3 weeks.    SIGNATURES/CONFIDENTIALITY: You and/or your care partner have signed paperwork which will be entered into your electronic medical record.  These signatures attest to the fact that that the information above on your After Visit Summary has been reviewed and is understood.  Full responsibility of the confidentiality of this discharge information lies with you and/or your care-partner. 

## 2015-12-30 ENCOUNTER — Telehealth: Payer: Self-pay | Admitting: *Deleted

## 2015-12-30 DIAGNOSIS — E11610 Type 2 diabetes mellitus with diabetic neuropathic arthropathy: Secondary | ICD-10-CM | POA: Diagnosis not present

## 2015-12-30 DIAGNOSIS — E1142 Type 2 diabetes mellitus with diabetic polyneuropathy: Secondary | ICD-10-CM | POA: Diagnosis not present

## 2015-12-30 DIAGNOSIS — L97511 Non-pressure chronic ulcer of other part of right foot limited to breakdown of skin: Secondary | ICD-10-CM | POA: Diagnosis not present

## 2015-12-30 DIAGNOSIS — L97512 Non-pressure chronic ulcer of other part of right foot with fat layer exposed: Secondary | ICD-10-CM | POA: Diagnosis not present

## 2015-12-30 NOTE — Telephone Encounter (Signed)
No answer, message left for the patient. 

## 2016-01-05 ENCOUNTER — Encounter: Payer: Self-pay | Admitting: Gastroenterology

## 2016-01-05 ENCOUNTER — Telehealth: Payer: Self-pay | Admitting: Internal Medicine

## 2016-01-05 MED ORDER — MECLIZINE HCL 12.5 MG PO TABS: 12.5000 mg | ORAL_TABLET | Freq: Three times a day (TID) | ORAL | Status: DC | PRN

## 2016-01-05 NOTE — Telephone Encounter (Signed)
Have not seen refill request for Meclizine for Pt. Rx sent.

## 2016-01-05 NOTE — Telephone Encounter (Signed)
Pharmacy: [facility], [place]  Reason for call: pt needing refill on meclizine. She said she called in into her pharmacy 2 weeks ago, again a couple days ago, and called the pharmacy today and they told her they've faxed [place] several requests. She has 3 pills left.

## 2016-01-13 ENCOUNTER — Ambulatory Visit: Payer: Medicare Other | Admitting: Internal Medicine

## 2016-01-13 DIAGNOSIS — M5417 Radiculopathy, lumbosacral region: Secondary | ICD-10-CM | POA: Diagnosis not present

## 2016-01-20 DIAGNOSIS — E11610 Type 2 diabetes mellitus with diabetic neuropathic arthropathy: Secondary | ICD-10-CM | POA: Diagnosis not present

## 2016-01-20 DIAGNOSIS — E1142 Type 2 diabetes mellitus with diabetic polyneuropathy: Secondary | ICD-10-CM | POA: Diagnosis not present

## 2016-01-20 DIAGNOSIS — L97411 Non-pressure chronic ulcer of right heel and midfoot limited to breakdown of skin: Secondary | ICD-10-CM | POA: Diagnosis not present

## 2016-01-25 ENCOUNTER — Ambulatory Visit (INDEPENDENT_AMBULATORY_CARE_PROVIDER_SITE_OTHER): Payer: Self-pay | Admitting: Gastroenterology

## 2016-01-25 VITALS — BP 122/64 | HR 70 | Ht 63.0 in | Wt 188.0 lb

## 2016-01-25 DIAGNOSIS — K641 Second degree hemorrhoids: Secondary | ICD-10-CM

## 2016-01-25 NOTE — Patient Instructions (Signed)
HEMORRHOID BANDING PROCEDURE    FOLLOW-UP CARE   1. The procedure you have had should have been relatively painless since the banding of the area involved does not have nerve endings and there is no pain sensation.  The rubber band cuts off the blood supply to the hemorrhoid and the band may fall off as soon as 48 hours after the banding (the band may occasionally be seen in the toilet bowl following a bowel movement). You may notice a temporary feeling of fullness in the rectum which should respond adequately to plain Tylenol or Motrin.  2. Following the banding, avoid strenuous exercise that evening and resume full activity the next day.  A sitz bath (soaking in a warm tub) or bidet is soothing, and can be useful for cleansing the area after bowel movements.     3. To avoid constipation, take two tablespoons of natural wheat bran, natural oat bran, flax, Benefiber or any over the counter fiber supplement and increase your water intake to 7-8 glasses daily.    4. Unless you have been prescribed anorectal medication, do not put anything inside your rectum for two weeks: No suppositories, enemas, fingers, etc.  5. Occasionally, you may have more bleeding than usual after the banding procedure.  This is often from the untreated hemorrhoids rather than the treated one.  Don't be concerned if there is a tablespoon or so of blood.  If there is more blood than this, lie flat with your bottom higher than your head and apply an ice pack to the area. If the bleeding does not stop within a half an hour or if you feel faint, call our office at [phone] or go to the emergency room.  6. Problems are not common; however, if there is a substantial amount of bleeding, severe pain, chills, fever or difficulty passing urine (very rare) or other problems, you should call us at [phone] or report to the nearest emergency room.  7. Do not stay seated continuously for more than 2-3 hours for a day or two  after the procedure.  Tighten your buttock muscles 10-15 times every two hours and take 10-15 deep breaths every 1-2 hours.  Do not spend more than a few minutes on the toilet if you cannot empty your bowel; instead re-visit the toilet at a later time.   Your 2nd banding is scheduled on 03/02/2016 at 3:30pm

## 2016-01-25 NOTE — Progress Notes (Signed)
PROCEDURE NOTE: The patient presents with symptomatic grade II  hemorrhoids, requesting rubber band ligation of his/her hemorrhoidal disease.  All risks, benefits and alternative forms of therapy were described and informed consent was obtained.  In the Left Lateral Decubitus position anoscopic examination revealed grade II hemorrhoids in the Left lateral, right posterior and right anterior position(s).  The anorectum was pre-medicated with 0.125 NTG  The decision was made to band the Left lateral internal hemorrhoid, and the [place] was used to perform band ligation without complication.  Digital anorectal examination was then performed to assure proper positioning of the band, and to adjust the banded tissue as required.  The patient was discharged home without pain or other issues.  Dietary and behavioral recommendations were given and along with follow-up instructions.     The following adjunctive treatments were recommended: Bene fiber Miralax  The patient will return in 2-4 weeks for  follow-up and possible additional banding as required. No complications were encountered and the patient tolerated the procedure well.

## 2016-02-08 ENCOUNTER — Telehealth: Payer: Self-pay | Admitting: Cardiology

## 2016-02-08 DIAGNOSIS — L97411 Non-pressure chronic ulcer of right heel and midfoot limited to breakdown of skin: Secondary | ICD-10-CM | POA: Diagnosis not present

## 2016-02-08 DIAGNOSIS — M542 Cervicalgia: Secondary | ICD-10-CM | POA: Diagnosis not present

## 2016-02-08 DIAGNOSIS — E1142 Type 2 diabetes mellitus with diabetic polyneuropathy: Secondary | ICD-10-CM | POA: Diagnosis not present

## 2016-02-08 NOTE — Telephone Encounter (Signed)
New Message:   Pt called in wanting to speak with the nurse a pain that she has had in her neck since the weekend. She would like to know if this is something she needs to come in and be evaluated for. Please f/u with her

## 2016-02-08 NOTE — Telephone Encounter (Signed)
Pt calling to inform Dr Meda Coffee that she has right sided neck pain, which goes into her shoulder area, and she wanted to make sure this wasn't cardiac in nature.  Pt states that she has had multiple neck surgeries and has had right shoulder surgery as well.  Pt is worried this is her "vein in her neck" causing this issue.  Pt states she has no cardiac complaints.  No chest pain, no sob, no DOE, no dizziness, no pre-syncopal or syncopal episodes Pt states she did make an appt with her orthopedic MD Dr Sharol Given for today at 2 pm, for these exact complaints.  Reassured the pt that when she see's Dr  Sharol Given today, he will make an assessment and decide if this is orthopedic in nature or vascular/cardiac in nature.  Informed the pt that if Dr Sharol Given feels as if this is not ortho related and she will need a vascular study or follow-up with Dr Meda Coffee, then he will notify our office of this or have her call us back to arrange accordingly.  Informed the pt that I will make Dr Meda Coffee aware of this conversation, and that she will be seeing her Ortho MD today for complaints mentioned.  Pt verbalized understanding, agrees with this plan, and gracious for all the assistance provided.

## 2016-02-09 ENCOUNTER — Telehealth: Payer: Self-pay | Admitting: Internal Medicine

## 2016-02-09 NOTE — Telephone Encounter (Signed)
Okay 120, 2 prescriptions 

## 2016-02-09 NOTE — Telephone Encounter (Signed)
Pt is requesting refill on Hydrocodone.  Last OV: 10/15/2015, appt scheduled for 04/05/2016 Last Fill: 12/13/2015 #120 and 0RF For February and March 2017 UDS: 08/11/2015 Low risk  Please advise.

## 2016-02-09 NOTE — Telephone Encounter (Signed)
Caller name: Self  Can be reached: [phone]   Pharmacy:  [facility], [place] [phone] (Phone) [phone] (Fax)         Reason for call: Refill HYDROcodone-acetaminophen ([place]) 10-325 MG tablet [id number]

## 2016-02-10 MED ORDER — HYDROCODONE-ACETAMINOPHEN 10-325 MG PO TABS: 1.0000 | ORAL_TABLET | Freq: Four times a day (QID) | ORAL | Status: DC | PRN

## 2016-02-10 MED ORDER — HYDROCODONE-ACETAMINOPHEN 10-325 MG PO TABS
1.0000 | ORAL_TABLET | Freq: Four times a day (QID) | ORAL | Status: DC | PRN
Start: 2016-02-10 — End: 2016-04-14

## 2016-02-10 NOTE — Telephone Encounter (Signed)
LMOM informing Pt that Rx's have been placed at front desk for pick up at her convenience.

## 2016-02-10 NOTE — Telephone Encounter (Signed)
Rx's printed for April and May 2017, awaiting MD signature.

## 2016-02-10 NOTE — Addendum Note (Signed)
Addended by: Damita Dunnings D on: 02/10/2016 07:48 AM   Modules accepted: Orders, Medications

## 2016-02-14 DIAGNOSIS — E11610 Type 2 diabetes mellitus with diabetic neuropathic arthropathy: Secondary | ICD-10-CM | POA: Diagnosis not present

## 2016-02-14 DIAGNOSIS — E1142 Type 2 diabetes mellitus with diabetic polyneuropathy: Secondary | ICD-10-CM | POA: Diagnosis not present

## 2016-02-14 DIAGNOSIS — M86271 Subacute osteomyelitis, right ankle and foot: Secondary | ICD-10-CM | POA: Diagnosis not present

## 2016-02-14 DIAGNOSIS — L97414 Non-pressure chronic ulcer of right heel and midfoot with necrosis of bone: Secondary | ICD-10-CM | POA: Diagnosis not present

## 2016-02-15 ENCOUNTER — Other Ambulatory Visit (HOSPITAL_COMMUNITY): Payer: Self-pay | Admitting: Family

## 2016-02-15 ENCOUNTER — Telehealth: Payer: Self-pay | Admitting: Internal Medicine

## 2016-02-15 NOTE — Telephone Encounter (Signed)
Thank you , they will keep me updated

## 2016-02-15 NOTE — Telephone Encounter (Signed)
Caller name: Self  Can be reached: [phone]   FYI: Patient called to inform Provider that she is being admitted to [facility] on Friday 4/28 by Dr. Sharol Given to have her right foot amputated.

## 2016-02-16 ENCOUNTER — Encounter: Payer: Medicare Other | Admitting: Internal Medicine

## 2016-02-17 ENCOUNTER — Encounter (HOSPITAL_COMMUNITY): Payer: Self-pay | Admitting: *Deleted

## 2016-02-17 DIAGNOSIS — H01004 Unspecified blepharitis left upper eyelid: Secondary | ICD-10-CM | POA: Diagnosis not present

## 2016-02-17 DIAGNOSIS — H01002 Unspecified blepharitis right lower eyelid: Secondary | ICD-10-CM | POA: Diagnosis not present

## 2016-02-17 DIAGNOSIS — H01001 Unspecified blepharitis right upper eyelid: Secondary | ICD-10-CM | POA: Diagnosis not present

## 2016-02-17 DIAGNOSIS — H04123 Dry eye syndrome of bilateral lacrimal glands: Secondary | ICD-10-CM | POA: Diagnosis not present

## 2016-02-17 MED ORDER — CLINDAMYCIN PHOSPHATE 900 MG/50ML IV SOLN
900.0000 mg | INTRAVENOUS | Status: AC
  Administered 2016-02-18: 900 mg via INTRAVENOUS
  Filled 2016-02-17: qty 50

## 2016-02-17 NOTE — Progress Notes (Signed)
Pt has history of CAD, cardiologist is Dr. Meda Coffee. Last office visit was 10/07/15. Pt denies chest pain or sob. Pt is diabetic, takes an OTC supplement for her diabetes. Last A1C was 7.6 on 10/15/15. Pt states fasting blood sugar is usually around 112.  Stress - 09/14/15 - in EPIC EKG - 04/02/15 - in EPIC Cath - 10/31/13 - in EPIC Echo - 12/15/12 - in EPIC

## 2016-02-18 ENCOUNTER — Inpatient Hospital Stay (HOSPITAL_COMMUNITY): Payer: Medicare Other | Admitting: Anesthesiology

## 2016-02-18 ENCOUNTER — Inpatient Hospital Stay (HOSPITAL_COMMUNITY)
Admission: RE | Admit: 2016-02-18 | Discharge: 2016-02-21 | DRG: 617 | Disposition: A | Discharge status: Skilled Nursing Facility | Payer: Medicare Other | Source: Ambulatory Visit | Attending: Orthopedic Surgery | Admitting: Orthopedic Surgery

## 2016-02-18 ENCOUNTER — Encounter (HOSPITAL_COMMUNITY): Payer: Self-pay | Admitting: *Deleted

## 2016-02-18 ENCOUNTER — Encounter (HOSPITAL_COMMUNITY)
Admission: RE | Disposition: A | Discharge status: Skilled Nursing Facility | Payer: Self-pay | Source: Ambulatory Visit | Attending: Orthopedic Surgery

## 2016-02-18 DIAGNOSIS — I2510 Atherosclerotic heart disease of native coronary artery without angina pectoris: Secondary | ICD-10-CM | POA: Diagnosis present

## 2016-02-18 DIAGNOSIS — M810 Age-related osteoporosis without current pathological fracture: Secondary | ICD-10-CM | POA: Diagnosis present

## 2016-02-18 DIAGNOSIS — Z981 Arthrodesis status: Secondary | ICD-10-CM | POA: Diagnosis not present

## 2016-02-18 DIAGNOSIS — G8929 Other chronic pain: Secondary | ICD-10-CM | POA: Diagnosis not present

## 2016-02-18 DIAGNOSIS — Z9841 Cataract extraction status, right eye: Secondary | ICD-10-CM | POA: Diagnosis not present

## 2016-02-18 DIAGNOSIS — E1169 Type 2 diabetes mellitus with other specified complication: Secondary | ICD-10-CM | POA: Diagnosis present

## 2016-02-18 DIAGNOSIS — E1140 Type 2 diabetes mellitus with diabetic neuropathy, unspecified: Secondary | ICD-10-CM | POA: Diagnosis present

## 2016-02-18 DIAGNOSIS — Z89511 Acquired absence of right leg below knee: Secondary | ICD-10-CM

## 2016-02-18 DIAGNOSIS — I10 Essential (primary) hypertension: Secondary | ICD-10-CM | POA: Diagnosis present

## 2016-02-18 DIAGNOSIS — K649 Unspecified hemorrhoids: Secondary | ICD-10-CM | POA: Diagnosis not present

## 2016-02-18 DIAGNOSIS — G9050 Complex regional pain syndrome I, unspecified: Secondary | ICD-10-CM | POA: Diagnosis present

## 2016-02-18 DIAGNOSIS — D649 Anemia, unspecified: Secondary | ICD-10-CM | POA: Diagnosis not present

## 2016-02-18 DIAGNOSIS — E785 Hyperlipidemia, unspecified: Secondary | ICD-10-CM | POA: Diagnosis present

## 2016-02-18 DIAGNOSIS — A5216 Charcot's arthropathy (tabetic): Secondary | ICD-10-CM | POA: Diagnosis present

## 2016-02-18 DIAGNOSIS — Z961 Presence of intraocular lens: Secondary | ICD-10-CM | POA: Diagnosis present

## 2016-02-18 DIAGNOSIS — K589 Irritable bowel syndrome without diarrhea: Secondary | ICD-10-CM | POA: Diagnosis present

## 2016-02-18 DIAGNOSIS — M869 Osteomyelitis, unspecified: Secondary | ICD-10-CM | POA: Diagnosis not present

## 2016-02-18 DIAGNOSIS — E11621 Type 2 diabetes mellitus with foot ulcer: Secondary | ICD-10-CM | POA: Diagnosis present

## 2016-02-18 DIAGNOSIS — Z9842 Cataract extraction status, left eye: Secondary | ICD-10-CM

## 2016-02-18 DIAGNOSIS — H3530 Unspecified macular degeneration: Secondary | ICD-10-CM | POA: Diagnosis present

## 2016-02-18 DIAGNOSIS — M86271 Subacute osteomyelitis, right ankle and foot: Secondary | ICD-10-CM | POA: Diagnosis not present

## 2016-02-18 DIAGNOSIS — M1990 Unspecified osteoarthritis, unspecified site: Secondary | ICD-10-CM | POA: Diagnosis not present

## 2016-02-18 DIAGNOSIS — R42 Dizziness and giddiness: Secondary | ICD-10-CM | POA: Diagnosis not present

## 2016-02-18 DIAGNOSIS — E11618 Type 2 diabetes mellitus with other diabetic arthropathy: Secondary | ICD-10-CM | POA: Diagnosis not present

## 2016-02-18 DIAGNOSIS — M79606 Pain in leg, unspecified: Secondary | ICD-10-CM | POA: Diagnosis not present

## 2016-02-18 DIAGNOSIS — K219 Gastro-esophageal reflux disease without esophagitis: Secondary | ICD-10-CM | POA: Diagnosis present

## 2016-02-18 DIAGNOSIS — M4726 Other spondylosis with radiculopathy, lumbar region: Secondary | ICD-10-CM | POA: Diagnosis not present

## 2016-02-18 DIAGNOSIS — I82409 Acute embolism and thrombosis of unspecified deep veins of unspecified lower extremity: Secondary | ICD-10-CM | POA: Diagnosis not present

## 2016-02-18 DIAGNOSIS — Z86711 Personal history of pulmonary embolism: Secondary | ICD-10-CM | POA: Diagnosis not present

## 2016-02-18 DIAGNOSIS — I96 Gangrene, not elsewhere classified: Secondary | ICD-10-CM | POA: Diagnosis not present

## 2016-02-18 DIAGNOSIS — E1142 Type 2 diabetes mellitus with diabetic polyneuropathy: Secondary | ICD-10-CM | POA: Diagnosis not present

## 2016-02-18 DIAGNOSIS — E11610 Type 2 diabetes mellitus with diabetic neuropathic arthropathy: Secondary | ICD-10-CM | POA: Diagnosis not present

## 2016-02-18 DIAGNOSIS — Z4781 Encounter for orthopedic aftercare following surgical amputation: Secondary | ICD-10-CM | POA: Diagnosis not present

## 2016-02-18 DIAGNOSIS — E039 Hypothyroidism, unspecified: Secondary | ICD-10-CM | POA: Diagnosis not present

## 2016-02-18 DIAGNOSIS — L97919 Non-pressure chronic ulcer of unspecified part of right lower leg with unspecified severity: Secondary | ICD-10-CM | POA: Diagnosis not present

## 2016-02-18 DIAGNOSIS — Z89519 Acquired absence of unspecified leg below knee: Secondary | ICD-10-CM

## 2016-02-18 DIAGNOSIS — M86461 Chronic osteomyelitis with draining sinus, right tibia and fibula: Secondary | ICD-10-CM | POA: Diagnosis not present

## 2016-02-18 DIAGNOSIS — F329 Major depressive disorder, single episode, unspecified: Secondary | ICD-10-CM | POA: Diagnosis not present

## 2016-02-18 DIAGNOSIS — I70261 Atherosclerosis of native arteries of extremities with gangrene, right leg: Secondary | ICD-10-CM | POA: Diagnosis not present

## 2016-02-18 DIAGNOSIS — J309 Allergic rhinitis, unspecified: Secondary | ICD-10-CM | POA: Diagnosis not present

## 2016-02-18 HISTORY — PX: AMPUTATION: SHX166

## 2016-02-18 HISTORY — DX: Headache: R51

## 2016-02-18 HISTORY — DX: Malignant (primary) neoplasm, unspecified: C80.1

## 2016-02-18 LAB — CBC
HCT: 34.8 % — ABNORMAL LOW (ref 36.0–46.0)
Hemoglobin: 10.9 g/dL — ABNORMAL LOW (ref 12.0–15.0)
MCH: 28.7 pg (ref 26.0–34.0)
MCHC: 31.3 g/dL (ref 30.0–36.0)
MCV: 91.6 fL (ref 78.0–100.0)
Platelets: 322 10*3/uL (ref 150–400)
RBC: 3.8 MIL/uL — ABNORMAL LOW (ref 3.87–5.11)
RDW: 14.3 % (ref 11.5–15.5)
WBC: 12.3 10*3/uL — ABNORMAL HIGH (ref 4.0–10.5)

## 2016-02-18 LAB — BASIC METABOLIC PANEL
Anion gap: 11 (ref 5–15)
BUN: 23 mg/dL — ABNORMAL HIGH (ref 6–20)
CO2: 17 mmol/L — ABNORMAL LOW (ref 22–32)
Calcium: 9.2 mg/dL (ref 8.9–10.3)
Chloride: 107 mmol/L (ref 101–111)
Creatinine, Ser: 1.61 mg/dL — ABNORMAL HIGH (ref 0.44–1.00)
GFR calc Af Amer: 34 mL/min — ABNORMAL LOW (ref >60)
GFR calc non Af Amer: 30 mL/min — ABNORMAL LOW (ref >60)
Glucose, Bld: 127 mg/dL — ABNORMAL HIGH (ref 65–99)
Potassium: 5.2 mmol/L — ABNORMAL HIGH (ref 3.5–5.1)
Sodium: 135 mmol/L (ref 135–145)

## 2016-02-18 LAB — GLUCOSE, CAPILLARY
Glucose-Capillary: 116 mg/dL — ABNORMAL HIGH (ref 65–99)
Glucose-Capillary: 139 mg/dL — ABNORMAL HIGH (ref 65–99)
Glucose-Capillary: 190 mg/dL — ABNORMAL HIGH (ref 65–99)

## 2016-02-18 SURGERY — AMPUTATION BELOW KNEE
Anesthesia: General | Laterality: Right

## 2016-02-18 MED ORDER — LIDOCAINE HCL (CARDIAC) 20 MG/ML IV SOLN
INTRAVENOUS | Status: DC | PRN
  Administered 2016-02-18: 60 mg via INTRAVENOUS

## 2016-02-18 MED ORDER — CHLORHEXIDINE GLUCONATE 4 % EX LIQD: 60.0000 mL | Freq: Once | CUTANEOUS | Status: DC

## 2016-02-18 MED ORDER — DIPHENHYDRAMINE HCL 12.5 MG/5ML PO ELIX: 12.5000 mg | ORAL_SOLUTION | ORAL | Status: DC | PRN

## 2016-02-18 MED ORDER — SODIUM CHLORIDE 0.9 % IV SOLN: INTRAVENOUS | Status: DC

## 2016-02-18 MED ORDER — ONDANSETRON HCL 4 MG/2ML IJ SOLN: 4.0000 mg | Freq: Once | INTRAMUSCULAR | Status: DC | PRN

## 2016-02-18 MED ORDER — METHOCARBAMOL 1000 MG/10ML IJ SOLN
500.0000 mg | Freq: Four times a day (QID) | INTRAVENOUS | Status: DC | PRN
  Filled 2016-02-18: qty 5

## 2016-02-18 MED ORDER — FENTANYL CITRATE (PF) 100 MCG/2ML IJ SOLN
INTRAMUSCULAR | Status: AC
  Filled 2016-02-18: qty 2

## 2016-02-18 MED ORDER — LIDOCAINE HCL (CARDIAC) 20 MG/ML IV SOLN
INTRAVENOUS | Status: AC
  Filled 2016-02-18: qty 5

## 2016-02-18 MED ORDER — FENTANYL CITRATE (PF) 100 MCG/2ML IJ SOLN
INTRAMUSCULAR | Status: DC | PRN
  Administered 2016-02-18 (×2): 50 ug via INTRAVENOUS
  Administered 2016-02-18: 100 ug via INTRAVENOUS
  Administered 2016-02-18: 50 ug via INTRAVENOUS

## 2016-02-18 MED ORDER — METOCLOPRAMIDE HCL 5 MG/ML IJ SOLN: 5.0000 mg | Freq: Three times a day (TID) | INTRAMUSCULAR | Status: DC | PRN

## 2016-02-18 MED ORDER — POLYETHYLENE GLYCOL 3350 17 G PO PACK: 17.0000 g | PACK | Freq: Every day | ORAL | Status: DC | PRN

## 2016-02-18 MED ORDER — CLINDAMYCIN PHOSPHATE 600 MG/50ML IV SOLN
600.0000 mg | Freq: Four times a day (QID) | INTRAVENOUS | Status: AC
  Administered 2016-02-18 – 2016-02-19 (×3): 600 mg via INTRAVENOUS
  Filled 2016-02-18 (×3): qty 50

## 2016-02-18 MED ORDER — DEXAMETHASONE SODIUM PHOSPHATE 4 MG/ML IJ SOLN
INTRAMUSCULAR | Status: DC | PRN
  Administered 2016-02-18: 4 mg via INTRAVENOUS

## 2016-02-18 MED ORDER — DEXMEDETOMIDINE HCL IN NACL 200 MCG/50ML IV SOLN
INTRAVENOUS | Status: AC
  Filled 2016-02-18: qty 50

## 2016-02-18 MED ORDER — SCOPOLAMINE 1 MG/3DAYS TD PT72
MEDICATED_PATCH | TRANSDERMAL | Status: AC
  Filled 2016-02-18: qty 1

## 2016-02-18 MED ORDER — PHENYLEPHRINE HCL 10 MG/ML IJ SOLN
INTRAMUSCULAR | Status: DC | PRN
  Administered 2016-02-18 (×2): 80 ug via INTRAVENOUS
  Administered 2016-02-18: 120 ug via INTRAVENOUS

## 2016-02-18 MED ORDER — ONDANSETRON HCL 4 MG/2ML IJ SOLN
INTRAMUSCULAR | Status: DC | PRN
  Administered 2016-02-18: 4 mg via INTRAVENOUS

## 2016-02-18 MED ORDER — ONDANSETRON HCL 4 MG/2ML IJ SOLN
INTRAMUSCULAR | Status: AC
  Filled 2016-02-18: qty 6

## 2016-02-18 MED ORDER — MECLIZINE HCL 12.5 MG PO TABS
12.5000 mg | ORAL_TABLET | Freq: Three times a day (TID) | ORAL | Status: DC | PRN
  Filled 2016-02-18: qty 1

## 2016-02-18 MED ORDER — OXYCODONE HCL 5 MG PO TABS
5.0000 mg | ORAL_TABLET | ORAL | Status: DC | PRN
  Administered 2016-02-18 – 2016-02-19 (×3): 10 mg via ORAL
  Filled 2016-02-18 (×3): qty 2

## 2016-02-18 MED ORDER — METOCLOPRAMIDE HCL 5 MG PO TABS: 5.0000 mg | ORAL_TABLET | Freq: Three times a day (TID) | ORAL | Status: DC | PRN

## 2016-02-18 MED ORDER — HYDROCODONE-ACETAMINOPHEN 7.5-325 MG PO TABS: 2.0000 | ORAL_TABLET | Freq: Once | ORAL | Status: DC | PRN

## 2016-02-18 MED ORDER — LACTATED RINGERS IV SOLN
INTRAVENOUS | Status: DC
  Administered 2016-02-18: 15:00:00 via INTRAVENOUS

## 2016-02-18 MED ORDER — FENTANYL CITRATE (PF) 250 MCG/5ML IJ SOLN
INTRAMUSCULAR | Status: AC
  Filled 2016-02-18: qty 5

## 2016-02-18 MED ORDER — PROPOFOL 10 MG/ML IV BOLUS
INTRAVENOUS | Status: DC | PRN
Start: 2016-02-18 — End: 2016-02-18
  Administered 2016-02-18: 150 mg via INTRAVENOUS

## 2016-02-18 MED ORDER — METHOCARBAMOL 500 MG PO TABS
500.0000 mg | ORAL_TABLET | Freq: Four times a day (QID) | ORAL | Status: DC | PRN
  Administered 2016-02-18 – 2016-02-21 (×5): 500 mg via ORAL
  Filled 2016-02-18 (×5): qty 1

## 2016-02-18 MED ORDER — ONDANSETRON HCL 4 MG/2ML IJ SOLN: 4.0000 mg | Freq: Four times a day (QID) | INTRAMUSCULAR | Status: DC | PRN

## 2016-02-18 MED ORDER — ONDANSETRON HCL 4 MG PO TABS
4.0000 mg | ORAL_TABLET | Freq: Four times a day (QID) | ORAL | Status: DC | PRN
  Administered 2016-02-21: 4 mg via ORAL
  Filled 2016-02-18: qty 1

## 2016-02-18 MED ORDER — DOCUSATE SODIUM 100 MG PO CAPS
100.0000 mg | ORAL_CAPSULE | Freq: Two times a day (BID) | ORAL | Status: DC
Start: 2016-02-18 — End: 2016-02-21
  Administered 2016-02-18 – 2016-02-21 (×5): 100 mg via ORAL
  Filled 2016-02-18 (×5): qty 1

## 2016-02-18 MED ORDER — ACETAMINOPHEN 650 MG RE SUPP: 650.0000 mg | Freq: Four times a day (QID) | RECTAL | Status: DC | PRN

## 2016-02-18 MED ORDER — ASPIRIN EC 325 MG PO TBEC
325.0000 mg | DELAYED_RELEASE_TABLET | Freq: Every day | ORAL | Status: DC
  Administered 2016-02-19 – 2016-02-21 (×3): 325 mg via ORAL
  Filled 2016-02-18 (×3): qty 1

## 2016-02-18 MED ORDER — FENTANYL CITRATE (PF) 100 MCG/2ML IJ SOLN
25.0000 ug | INTRAMUSCULAR | Status: DC | PRN
  Administered 2016-02-18 (×3): 50 ug via INTRAVENOUS

## 2016-02-18 MED ORDER — PROPOFOL 10 MG/ML IV BOLUS
INTRAVENOUS | Status: AC
  Filled 2016-02-18: qty 20

## 2016-02-18 MED ORDER — ACETAMINOPHEN 325 MG PO TABS
650.0000 mg | ORAL_TABLET | Freq: Four times a day (QID) | ORAL | Status: DC | PRN
  Administered 2016-02-21: 650 mg via ORAL
  Filled 2016-02-18: qty 2

## 2016-02-18 MED ORDER — 0.9 % SODIUM CHLORIDE (POUR BTL) OPTIME
TOPICAL | Status: DC | PRN
  Administered 2016-02-18: 1000 mL

## 2016-02-18 SURGICAL SUPPLY — 37 items
BLADE SAW RECIP 87.9 MT (BLADE) ×2 IMPLANT
BLADE SURG 21 STRL SS (BLADE) ×2 IMPLANT
BNDG COHESIVE 6X5 TAN STRL LF (GAUZE/BANDAGES/DRESSINGS) ×2 IMPLANT
BNDG GAUZE ELAST 4 BULKY (GAUZE/BANDAGES/DRESSINGS) ×3 IMPLANT
COVER SURGICAL LIGHT HANDLE (MISCELLANEOUS) ×4 IMPLANT
CUFF TOURNIQUET SINGLE 34IN LL (TOURNIQUET CUFF) ×1 IMPLANT
CUFF TOURNIQUET SINGLE 44IN (TOURNIQUET CUFF) IMPLANT
DRAPE EXTREMITY T 121X128X90 (DRAPE) ×2 IMPLANT
DRAPE INCISE IOBAN 66X45 STRL (DRAPES) ×1 IMPLANT
DRAPE PROXIMA HALF (DRAPES) ×2 IMPLANT
DRAPE U-SHAPE 47X51 STRL (DRAPES) ×2 IMPLANT
DRSG ADAPTIC 3X8 NADH LF (GAUZE/BANDAGES/DRESSINGS) ×2 IMPLANT
DRSG PAD ABDOMINAL 8X10 ST (GAUZE/BANDAGES/DRESSINGS) ×1 IMPLANT
DRSG VAC ATS LRG SENSATRAC (GAUZE/BANDAGES/DRESSINGS) ×1 IMPLANT
DURAPREP 26ML APPLICATOR (WOUND CARE) ×2 IMPLANT
ELECT REM PT RETURN 9FT ADLT (ELECTROSURGICAL) ×2
ELECTRODE REM PT RTRN 9FT ADLT (ELECTROSURGICAL) ×1 IMPLANT
GAUZE SPONGE 4X4 12PLY STRL (GAUZE/BANDAGES/DRESSINGS) ×2 IMPLANT
GLOVE BIOGEL PI IND STRL 9 (GLOVE) ×1 IMPLANT
GLOVE BIOGEL PI INDICATOR 9 (GLOVE) ×1
GLOVE SURG ORTHO 9.0 STRL STRW (GLOVE) ×2 IMPLANT
GOWN STRL REUS W/ TWL XL LVL3 (GOWN DISPOSABLE) ×2 IMPLANT
GOWN STRL REUS W/TWL XL LVL3 (GOWN DISPOSABLE) ×4
KIT BASIN OR (CUSTOM PROCEDURE TRAY) ×2 IMPLANT
KIT ROOM TURNOVER OR (KITS) ×2 IMPLANT
MANIFOLD NEPTUNE II (INSTRUMENTS) ×2 IMPLANT
NS IRRIG 1000ML POUR BTL (IV SOLUTION) ×2 IMPLANT
PACK GENERAL/GYN (CUSTOM PROCEDURE TRAY) ×2 IMPLANT
PAD ARMBOARD 7.5X6 YLW CONV (MISCELLANEOUS) ×3 IMPLANT
PREVENA INCISION MGT 90 150 (MISCELLANEOUS) ×1 IMPLANT
SPONGE LAP 18X18 X RAY DECT (DISPOSABLE) IMPLANT
STAPLER VISISTAT 35W (STAPLE) ×1 IMPLANT
STOCKINETTE IMPERVIOUS LG (DRAPES) ×2 IMPLANT
SUT SILK 2 0 (SUTURE) ×2
SUT SILK 2-0 18XBRD TIE 12 (SUTURE) ×1 IMPLANT
SUT VIC AB 1 CTX 27 (SUTURE) IMPLANT
TOWEL OR 17X26 10 PK STRL BLUE (TOWEL DISPOSABLE) ×2 IMPLANT

## 2016-02-18 NOTE — Anesthesia Procedure Notes (Signed)
Procedure Name: LMA Insertion Date/Time: 02/18/2016 3:21 PM Performed by: Lance Coon Pre-anesthesia Checklist: Patient identified, Timeout performed, Emergency Drugs available, Suction available and Patient being monitored Patient Re-evaluated:Patient Re-evaluated prior to inductionOxygen Delivery Method: Circle system utilized Preoxygenation: Pre-oxygenation with 100% oxygen Intubation Type: IV induction LMA: LMA inserted LMA Size: 4.0 Tube secured with: Tape Dental Injury: Teeth and Oropharynx as per pre-operative assessment

## 2016-02-18 NOTE — Anesthesia Preprocedure Evaluation (Addendum)
Anesthesia Evaluation  Patient identified by MRN, date of birth, ID band Patient awake    Reviewed: Allergy & Precautions, NPO status , Patient's Chart, lab work & pertinent test results, reviewed documented beta blocker date and time   History of Anesthesia Complications (+) PONV  Airway Mallampati: III  TM Distance: >3 FB Neck ROM: Full    Dental  (+) Poor Dentition, Teeth Intact, Dental Advisory Given   Pulmonary asthma ,    Pulmonary exam normal        Cardiovascular hypertension, Pt. on medications and Pt. on home beta blockers + CAD and + Peripheral Vascular Disease   Rhythm:Regular Rate:Normal  2014 Echo: Left ventricle: The cavity size was normal. Wall thickness was increased in a pattern of mild LVH. There was mild focal basal hypertrophy of the septum. Systolic function was normal. The estimated ejection fraction was in the range of 60% to 65%. Wall motion was normal; there were no regional wall motion abnormalities. Doppler parameters are consistent with abnormal left ventricular relaxation (grade 1 diastolic dysfunction). - Mitral valve: Calcified annulus. - Left atrium: The atrium was mildly dilated. - Atrial septum: There was an atrial septal aneurysm. - Pulmonary arteries: Systolic pressure was mildly increased. PA peak pressure: 81mm Hg (S). 08/2015 Nuclear stress EF: 71%. Normal LV function    There was no ST segment deviation noted during stress.  The study is normal. No ischemia. No evidence of MI .  This is a low risk study.   Neuro/Psych Depression  Neuromuscular disease    GI/Hepatic Neg liver ROS, hiatal hernia, GERD  ,  Endo/Other  diabetes  Renal/GU negative Renal ROS     Musculoskeletal  (+) Arthritis ,   Abdominal   Peds  Hematology negative hematology ROS (+)   Anesthesia Other Findings   Reproductive/Obstetrics                            Lab Results  Component Value Date   WBC 6.5 09/01/2015   HGB 12.8 09/01/2015   HCT 39.7 09/01/2015   MCV 87.8 09/01/2015   PLT 203 09/01/2015   Lab Results  Component Value Date   CREATININE 0.90 09/01/2015   BUN 16 09/01/2015   NA 141 09/01/2015   K 4.8 09/01/2015   CL 104 09/01/2015   CO2 26 09/01/2015    Anesthesia Physical Anesthesia Plan  ASA: III  Anesthesia Plan: General   Post-op Pain Management:    Induction: Intravenous  Airway Management Planned: LMA  Additional Equipment:   Intra-op Plan:   Post-operative Plan: Extubation in OR  Informed Consent: I have reviewed the patients History and Physical, chart, labs and discussed the procedure including the risks, benefits and alternatives for the proposed anesthesia with the patient or authorized representative who has indicated his/her understanding and acceptance.   Dental advisory given  Plan Discussed with: CRNA  Anesthesia Plan Comments:         Anesthesia Quick Evaluation

## 2016-02-18 NOTE — Op Note (Signed)
   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  POSTOPERATIVE DIAGNOSIS: Same.  PROCEDURE: Transtibial amputation Application of Prevena wound VAC  SURGEON: Sharol Given, M.D.  ANESTHESIA:  general  IV FLUIDS AND URINE: See anesthesia.  ESTIMATED BLOOD LOSS: inimal mL.  COMPLICATIONS: None.  DESCRIPTION OF PROCEDURE: The patient was brought to the operating room and underwent a general anesthetic. After adequate levels of anesthesia were obtained patient's lower extremity was prepped using DuraPrep draped into a sterile field. A timeout was called. The foot was draped out of the sterile field with impervious stockinette. A transverse incision was made 11 cm distal to the tibial tubercle. This curved proximally and a large posterior flap was created. The tibia was transected 1 cm proximal to the skin incision. The fibula was transected just proximal to the tibial incision. The tibia was beveled anteriorly. A large posterior flap was created. The sciatic nerve was pulled cut and allowed to retract. The vascular bundles were suture ligated with 2-0 silk. The deep and superficial fascial layers were closed using #1 Vicryl. The skin was closed using staples and 2-0 nylon. The wound was covered with a Prevena wound VAC. There was a good suction fit. Patient was extubated taken to the PACU in stable condition.  Meridee Score, MD [place] 3:56 PM

## 2016-02-18 NOTE — H&P (Signed)
[name] is an 81 y.o. female.   Chief Complaint: Ulceration osteomyelitis Charcot collapse right foot HPI: Patient is a 81 year old woman with diabetic insensate neuropathy who has Charcot collapse of the right foot. She has undergone prolonged limb salvage intervention. Patient has progressive Charcot collapse ulceration and osteomyelitis and does not have any foot salvage intervention options available at this time.  Past Medical History  Diagnosis Date  . Hyperlipidemia     a. patient unwilling to use statins.  . Osteoporosis   . DVT (deep venous thrombosis) ([place]) 12/2007  . PE (pulmonary embolism) 12/2007    a. PE/DVT after neck surgery 2009. b. coumadin d/c 10-2008.  . Pulmonary nodule     incidental per CT:  Pet scan 4-9: likely benign, CT 08-2009 no change, no further CTs (Dr. Gwenette Greet)  . Hemorrhoids   . IBS (irritable bowel syndrome)   . Diverticulosis   . PPD positive   . Macular degeneration 03/2009    Dr. Rosana Hoes  . Allergic rhinitis   . Dyspnea     a. Chronic, extensive w/u see OV note 09-2010. b. [place] 2011: ormal with RA 5 RV 31/2 PA 27/12 (19) PCW 10 CO normal. No evidence of shunting with sitting up in cath lab. c. CPX 2011: see report. d. Prior fluoro of diaphragm -  R diaphragm elevated at rest but both moved with inspiration.   Marland Kitchen Spinal stenosis   . GERD (gastroesophageal reflux disease)     a. Hx GERD/esophageal dysmotility followed by Dr. Olevia Perches.   Marland Kitchen Hypertension   . Neuropathy ([place])     a. Hands, feet, legs.  . Orthostatic hypotension   . CAD (coronary artery disease)     a. Nonobst in 2011. b. Abnormal nuc 09/2013 -> s/p cutting balloon to D2; mild LAD disease 10/31/13.  . Osteomyelitis ([place])     a. Adm 04/2013: Charcot collapse of the right foot with osteomyelitis and ulceration, s/p excision.  . Venous insufficiency     a. Contributing to LEE.  . Type II diabetes mellitus ([place])   . DJD (degenerative joint disease)   . Arthritis     "back; ankles; hands;  knees" (10/31/2013)  . Recurrent UTI   . 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 1994 - malignant polyp removed during colonoscopy.  . Hiatal hernia     a. s/p Nissen fundoplication [id number].  Marland Kitchen RSD (reflex sympathetic dystrophy)     a. Chronic pain.  . Abnormal echocardiogram     09/2013: mild LVH, mild focal basal hypertrophy of septum, EF 60-65%, normal WM, grade 1 diastolic dysfunction, MAC, mild LAE, ASA, PASP 34. Possible oscillating MV density - reviewed by MD and felt there was no significant abnormality other than MAC noted with mitral valve and did not require TEE.  . Adenomatous colon polyp   . Pneumonia   . Fatty liver   . PONV (postoperative nausea and vomiting) 2009    neck surgery  . Headache     Optic migraine  . Cancer [facility])     cancerous polyps  . Macular degeneration     Past Surgical History  Procedure Laterality Date  . Nissen fundoplication  [id number]  . Cholecystectomy  03/2002  . Cataract extraction w/ intraocular lens  implant, bilateral  2004    feb 2004 left, aug 2004 right  . Rotator cuff repair Right 07/2007    Dr. Percell Miller  . Anterior cervical decomp/discectomy fusion  12/18/07  For OA,  Dr. Lorin Mercy:  fu by a PE  . Toe amputation Right 08/2008    3rd toe, Dr. Sharol Given due to osteomyelitis  . Nasal septum surgery  09/1963  . Vein ligation Bilateral 03/1966  . Carpal tunnel release Left 09/2011  . Amputation  03/06/2012    Procedure: AMPUTATION FOOT;  Surgeon: Newt Minion, MD;  Location: [place];  Service: Orthopedics;  Laterality: Left;  FIFTH RAY AMPUTATION   . Vena cava filter placement  01/2010    green filter; "due to blood clots"  . Ankle fusion  09/27/2012    Procedure: ANKLE FUSION;  Surgeon: Newt Minion, MD;  Location: [place];  Service: Orthopedics;  Laterality: Left;  Left Tibiocalcaneal Fusion  . Ankle fusion Right 05/09/2013    Procedure: ANKLE FUSION;  Surgeon: Newt Minion, MD;  Location: [place];  Service: Orthopedics;  Laterality: Right;  Excision  Osteomyelitis Base 1st MT Right Foot, Fusion Medial Column  . Cardiac catheterization  05/2010     at [facility]  . Coronary angioplasty  10/31/2013  . Vaginal hysterectomy  03/1975  . Ankle surgery Left apr & june 1991  . Cyst removal hand  06/2003  . Rotator cuff repair Left 11/2004  . Wisdom tooth extraction  06/2007  . Foot bone excision Right 06/2009  . Left heart catheterization with coronary angiogram N/A 10/31/2013    Procedure: LEFT HEART CATHETERIZATION WITH CORONARY ANGIOGRAM;  Surgeon: Jettie Booze, MD;  Location: [facility] CATH LAB;  Service: Cardiovascular;  Laterality: N/A;  . Percutaneous coronary intervention-balloon only  10/31/2013    Procedure: PERCUTANEOUS CORONARY INTERVENTION-BALLOON ONLY;  Surgeon: Jettie Booze, MD;  Location: [facility] CATH LAB;  Service: Cardiovascular;;  . Laparoscopic right hemi colectomy N/A 01/08/2015    Procedure: LAPAROSCOPIC ASSISTED RIGHT HEMI COLECTOMY;  Surgeon: Johnathan Hausen, MD;  Location: [facility] ORS;  Service: General;  Laterality: N/A;  . Colonoscopy      numerous times    Family History  Problem Relation Age of Onset  . Migraines Sister   . Hypertension    . Breast cancer    . Heart disease Mother     mitral valve replaced  . Arthritis Mother   . Colon cancer Neg Hx   . Esophageal cancer Neg Hx   . Stomach cancer Neg Hx   . Rectal cancer Neg Hx   . Diabetic kidney disease Daughter   . Headache Sister   . Diabetes Father   . Stroke Father    Social History:  reports that she has never smoked. She has never used smokeless tobacco. She reports that she does not drink alcohol or use illicit drugs.  Allergies:  Allergies  Allergen Reactions  . Cymbalta [Duloxetine Hcl] Swelling    Swelling in legs  . Gabapentin Swelling    Swelling in legs  . Metformin And Related Other (See Comments)    dizzy, tired, chills, diarrhea, and nausea  . Adhesive [Tape] Rash    rash  . Cefazolin Hives    Hives   . Ciprofloxacin Other (See Comments)     Per pt, caused body aches   . Clorazepate Dipotassium Other (See Comments)    Unknown reaction  . Darifenacin Hydrobromide Other (See Comments)    hypotension, near syncope  . Dilaudid [Hydromorphone Hcl] Other (See Comments)     confused, intense itching  . Doxycycline Rash  . Enablex [Darifenacin Hydrobromide Er] Other (See Comments)    Hypotension, near syncope  .  Levofloxacin Other (See Comments)    Causes wrist pain  . Lyrica [Pregabalin] Swelling    Swelling in legs  . Methadone Hcl Other (See Comments)    Reaction unknown  . Morphine And Related Other (See Comments)    Confusion, constipation.   . Pentazocine Lactate Other (See Comments)    Altered mental state, "climbing walls", anxiety  . Talwin [Pentazocine] Other (See Comments)    "climbing walls" anxiety    No prescriptions prior to admission    No results found for this or any previous visit (from the past 48 hour(s)). No results found.  Review of Systems  All other systems reviewed and are negative.   There were no vitals taken for this visit. Physical Exam  On examination patient has a palpable dorsalis pedis pulse. She has a Charcot rocker-bottom deformity with exposed bone and abscess ulceration osteomyelitis right midfoot. Assessment/Plan Assessment: Diabetic insensate neuropathy Charcot collapse of osteomyelitis ulceration and cellulitis right foot.  Plan: Patient does not have any foot salvage intervention options at this time patient wishes to proceed with a transtibial amputation. Risks and benefits were discussed including risk of the wound not healing. Patient states she understands wish to proceed at this time.  Newt Minion, MD 02/18/2016, 6:19 AM

## 2016-02-18 NOTE — Anesthesia Postprocedure Evaluation (Signed)
Anesthesia Post Note  Patient: [name]  Procedure(s) Performed: Procedure(s) (LRB): AMPUTATION BELOW KNEE (Right)  Patient location during evaluation: PACU Anesthesia Type: General Level of consciousness: awake and alert Pain management: pain level controlled Vital Signs Assessment: post-procedure vital signs reviewed and stable Respiratory status: spontaneous breathing, nonlabored ventilation, respiratory function stable and patient connected to nasal cannula oxygen Cardiovascular status: blood pressure returned to baseline and stable Postop Assessment: no signs of nausea or vomiting Anesthetic complications: no    Last Vitals:  Filed Vitals:   02/18/16 1615 02/18/16 1630  BP: 120/55 120/55  Pulse: 76 74  Temp:    Resp: 14 15    Last Pain:  Filed Vitals:   02/18/16 1641  PainSc: Asleep                 [name]

## 2016-02-18 NOTE — Transfer of Care (Signed)
Immediate Anesthesia Transfer of Care Note  Patient: [name]  Procedure(s) Performed: Procedure(s): AMPUTATION BELOW KNEE (Right)  Patient Location: PACU  Anesthesia Type:General  Level of Consciousness: awake, alert , oriented and patient cooperative  Airway & Oxygen Therapy: Patient Spontanous Breathing and Patient connected to face mask oxygen  Post-op Assessment: Report given to RN, Post -op Vital signs reviewed and stable and Patient moving all extremities X 4  Post vital signs: Reviewed and stable  Last Vitals:  Filed Vitals:   02/18/16 1406  BP: 104/41  Pulse: 75  Temp: 36.8 C  Resp: 16    Last Pain: There were no vitals filed for this visit.    Patients Stated Pain Goal: 3 ([id number])  Complications: No apparent anesthesia complications

## 2016-02-19 LAB — HEMOGLOBIN A1C
Hgb A1c MFr Bld: 7.1 % — ABNORMAL HIGH (ref 4.8–5.6)
Mean Plasma Glucose: 157 mg/dL

## 2016-02-19 LAB — GLUCOSE, CAPILLARY
Glucose-Capillary: 122 mg/dL — ABNORMAL HIGH (ref 65–99)
Glucose-Capillary: 127 mg/dL — ABNORMAL HIGH (ref 65–99)
Glucose-Capillary: 124 mg/dL — ABNORMAL HIGH (ref 65–99)
Glucose-Capillary: 129 mg/dL — ABNORMAL HIGH (ref 65–99)

## 2016-02-19 MED ORDER — OXYCODONE HCL 5 MG PO TABS
5.0000 mg | ORAL_TABLET | ORAL | Status: DC | PRN
  Administered 2016-02-19 – 2016-02-21 (×13): 15 mg via ORAL
  Filled 2016-02-19 (×13): qty 3

## 2016-02-19 NOTE — Progress Notes (Signed)
Pain level rated as moderate - pain meds adjusted Dressings c/d/i Up with PT NWB Will keep thru weekend  N. Eduard Roux, MD [place] [phone] 9:01 AM

## 2016-02-19 NOTE — Evaluation (Signed)
Physical Therapy Evaluation Patient Details Name: [name] MRN: [id number] DOB: 1935-03-11 Today's Date: 02/19/2016   History of Present Illness  Patient is a 81 year old woman with diabetic insensate neuropathy who has Charcot collapse of the right foot. She has undergone prolonged limb salvage intervention. Patient has progressive Charcot collapse ulceration and osteomyelitis , s/p Rt TTA 02/18/16.  Clinical Impression  Patient's mobility also limited by left ankle prior surgeries and she is accustomed to wearing left AFO (does not have with her while in hospital).  Therefore anticipate difficulty with standing mobility while NWB Rt leg.  Pt with hypersensitive Rt lower leg, advised on positioning, benefits of ice and exercise to assist with pain management with return verbal demo understanding.  Pt with multiple questions related to prosthesis and anticipated timeframe.  Therapy will continue to follow to assist with discharge planning and follow up recommendations.     Follow Up Recommendations SNF;Supervision for mobility/OOB    Equipment Recommendations  Wheelchair (measurements PT)    Recommendations for Other Services OT consult     Precautions / Restrictions Precautions Precautions: Fall Precaution Comments: wears left AFO at home Restrictions Weight Bearing Restrictions: Yes RLE Weight Bearing: Non weight bearing      Mobility  Bed Mobility Overal bed mobility: Needs Assistance Bed Mobility: Supine to Sit     Supine to sit: Supervision     General bed mobility comments: with use of left rail  Transfers Overall transfer level: Needs assistance Equipment used: Rolling walker (2 wheeled) Transfers: Sit to/from [facility] Sit to Stand: Mod assist Stand pivot transfers: Mod assist       General transfer comment: unable to clear lt foot during mobility  Ambulation/Gait                Stairs            Wheelchair Mobility     Modified Rankin (Stroke Patients Only)       Balance Overall balance assessment: Needs assistance Sitting-balance support: Bilateral upper extremity supported;Feet supported Sitting balance-Leahy Scale: Good     Standing balance support: Bilateral upper extremity supported Standing balance-Leahy Scale: Poor Standing balance comment: heavy reliance on walker support                             Pertinent Vitals/Pain Pain Assessment: 0-10 Pain Score: 6  Pain Location: Rt lower leg (phantom pain) Pain Descriptors / Indicators: Aching;Cramping;Numbness;Tender Pain Intervention(s): Limited activity within patient's tolerance;Monitored during session;Premedicated before session;Repositioned;Ice applied    Home Living Family/patient expects to be discharged to:: Private residence Living Arrangements: Alone Available Help at Discharge: Other (Comment) (to be further assessed) Type of Home: House Home Access: Level entry     Home Layout: One level Home Equipment: Walker - 2 wheels;Cane - single point;Other (comment);Crutches (left AFO)      Prior Function Level of Independence: Independent with assistive device(s)         Comments: uses cane inside her home, walker in community     Hand Dominance   Dominant Hand: Right    Extremity/Trunk Assessment   Upper Extremity Assessment: Overall WFL for tasks assessed           Lower Extremity Assessment: Generalized weakness      Cervical / Trunk Assessment: Normal  Communication   Communication: No difficulties  Cognition Arousal/Alertness: Awake/alert Behavior During Therapy: WFL for tasks assessed/performed Overall Cognitive Status: Within Functional Limits for  tasks assessed                      General Comments General comments (skin integrity, edema, etc.): wound vac Rt residual limb    Exercises        Assessment/Plan    PT Assessment Patient needs continued PT services  PT  Diagnosis Difficulty walking;Abnormality of gait;Generalized weakness;Acute pain   PT Problem List Decreased strength;Decreased range of motion;Decreased activity tolerance;Decreased balance;Decreased mobility;Decreased knowledge of use of DME;Decreased knowledge of precautions;Pain;Impaired sensation  PT Treatment Interventions DME instruction;Gait training;Functional mobility training;Therapeutic activities;Therapeutic exercise;Balance training;Neuromuscular re-education;Patient/family education;Wheelchair mobility training   PT Goals (Current goals can be found in the Care Plan section) Acute Rehab PT Goals Patient Stated Goal: learn how to use prosthesis PT Goal Formulation: With patient Time For Goal Achievement: 03/04/16 Potential to Achieve Goals: Good    Frequency Min 3X/week   Barriers to discharge Decreased caregiver support      Co-evaluation               End of Session Equipment Utilized During Treatment: Gait belt Activity Tolerance: Patient tolerated treatment well Patient left: in chair;with call bell/phone within reach Nurse Communication: Mobility status         Time: 1210-1254 PT Time Calculation (min) (ACUTE ONLY): 44 min   Charges:   PT Evaluation $PT Eval Low Complexity: 1 Procedure PT Treatments $Self Care/Home Management: 05-Jul-2023   PT G CodesMalka So, PT [phone]  Yamileth Hayse 02/19/2016, 3:49 PM

## 2016-02-19 NOTE — Care Management Note (Addendum)
Case Management Note  Patient Details  Name: [name] MRN: [id number] Date of Birth: 1935-09-22  Subjective/Objective: 81 yo F with diabetic insensate neuropathy who has Charcot collapse of the right foot. s/p R TTA.              Action/Plan: PT is recommending SNF   Expected Discharge Date:   02/21/16               Expected Discharge Plan:  Skilled Nursing Facility  In-House Referral:  Clinical Social Work  Discharge planning Services  CM Consult  Post Acute Care Choice:    Choice offered to:     DME Arranged:    DME Agency:     HH Arranged:    [place] Agency:     Status of Service:  In process, will continue to follow  Medicare Important Message Given:    Date Medicare IM Given:    Medicare IM give by:    Date Additional Medicare IM Given:    Additional Medicare Important Message give by:     If discussed at [place] of Stay Meetings, dates discussed:    Additional Comments: met with pt at bedside. Her daughter lives with her. Discussed PT recommendations for SNF. She agrees with SNF placement and she stated that it has been arranged at [facility]. Virgel Bouquet, SW for SNF referral and left a VM.   Norina Buzzard, RN 02/19/2016, 4:06 PM

## 2016-02-20 LAB — GLUCOSE, CAPILLARY
Glucose-Capillary: 109 mg/dL — ABNORMAL HIGH (ref 65–99)
Glucose-Capillary: 113 mg/dL — ABNORMAL HIGH (ref 65–99)
Glucose-Capillary: 135 mg/dL — ABNORMAL HIGH (ref 65–99)
Glucose-Capillary: 149 mg/dL — ABNORMAL HIGH (ref 65–99)

## 2016-02-20 NOTE — Clinical Social Work Note (Signed)
Clinical Social Work Assessment  Patient Details  Name: [name] MRN: [id number] Date of Birth: 17-Jun-1935  Date of referral:  02/20/16               Reason for consult:  Discharge Planning                Permission sought to share information with:  Other Permission granted to share information::  No  Name::        Agency::     Relationship::     Contact Information:     Housing/Transportation Living arrangements for the past 2 months:  Single Family Home Source of Information:  Patient Patient Interpreter Needed:  None Criminal Activity/Legal Involvement Pertinent to Current Situation/Hospitalization:  No - Comment as needed Significant Relationships:  Adult Children Lives with:  Self Do you feel safe going back to the place where you live?  Yes Need for family participation in patient care:  No (Coment)  Care giving concerns:  Pt lives alone and PT recommending SNF.   Social Worker assessment / plan:  CSW met with pt at bedside and introduced myself and explained role. Pt reports surgery was planned and that she pre-registered at [facility] for rehab. Pt reports that she has completed paperwork and plans to DC after she is medically stable. Pt gave CSW permission to send [place] information. Pt reports she will update family and CSW does not need to contact them.  CSW spoke with Vickii Chafe at [facility] [phone]) who reports that they were not informed that pt was a bundle pt and was expecting admit on Monday.  Employment status:  Retired Forensic scientist:  Medicare PT Recommendations:  [place] / Referral to community resources:  [place]  Patient/Family's Response to care:  Pt alert and oriented and engaged during assessment.  Patient/Family's Understanding of and Emotional Response to Diagnosis, Current Treatment, and Prognosis:  Pt is hopeful for a quick recovery and to have a good experience at SNF.  Emotional  Assessment Appearance:  Appears stated age Attitude/Demeanor/Rapport:  Other (Pleasant) Affect (typically observed):  Appropriate Orientation:  Oriented to Self, Oriented to Place, Oriented to  Time, Oriented to Situation Alcohol / Substance use:  Not Applicable Psych involvement (Current and /or in the community):  No (Comment)  Discharge Needs  Concerns to be addressed:  No discharge needs identified Readmission within the last 30 days:  No Current discharge risk:  None Barriers to Discharge:  No Barriers Identified   Boone Master, [place] 02/20/2016, 10:21 AM Weekend Coverage

## 2016-02-20 NOTE — Progress Notes (Signed)
Patient has walked around the unit several times today.  She is passing gas.  Pain is rated 2/3.  IV removed.  Discharge instructions and prescription provided to patient.  No questions at time of discharge.  Belongings with family.  Patient escorted via wheelchair to husband's car.

## 2016-02-20 NOTE — Clinical Social Work Placement (Signed)
   CLINICAL SOCIAL WORK PLACEMENT  NOTE  Date:  02/20/2016  Patient Details  Name: [name] MRN: [id number] Date of Birth: Aug 12, 1935  Clinical Social Work is seeking post-discharge placement for this patient at the [place] level of care (*CSW will initial, date and re-position this form in  chart as items are completed):  Yes   Patient/family provided with [place] Work Department's list of facilities offering this level of care within the geographic area requested by the patient (or if unable, by the patient's family).  Yes   Patient/family informed of their freedom to choose among providers that offer the needed level of care, that participate in Medicare, Medicaid or managed care program needed by the patient, have an available bed and are willing to accept the patient.  Yes   Patient/family informed of [place]'s ownership interest in [facility] and [facility], as well as of the fact that they are under no obligation to receive care at these facilities.  PASRR submitted to EDS on       PASRR number received on       Existing PASRR number confirmed on 02/20/16     FL2 transmitted to all facilities in geographic area requested by pt/family on 02/20/16     FL2 transmitted to all facilities within larger geographic area on       Patient informed that his/her managed care company has contracts with or will negotiate with certain facilities, including the following:        Yes   Patient/family informed of bed offers received.  Patient chooses bed at [facility] recommends and patient chooses bed at      Patient to be transferred to [facility] [place] on  .  Patient to be transferred to facility by       Patient family notified on   of transfer.  Name of family member notified:        PHYSICIAN       Additional Comment:    _______________________________________________ Boone Master, [place] 02/20/2016, 10:18 AM Weekend Coverage

## 2016-02-20 NOTE — NC FL2 (Signed)
[place] LEVEL OF CARE SCREENING TOOL     IDENTIFICATION  Patient Name: [name] Birthdate: 10/06/1935 Sex: female Admission Date (Current Location): 02/18/2016  [facility] and [place] Number:  Herbalist and Address:  [facility] [place], [place] [address], [place], [place] [address]      Provider Number: [id number]  Attending Physician Name and Address:  Newt Minion, MD  Relative Name and Phone Number:       Current Level of Care: Hospital Recommended Level of Care: [place] Prior Approval Number:    Date Approved/Denied:   PASRR Number:  [id number]   Discharge Plan: SNF    Current Diagnoses: Patient Active Problem List   Diagnosis Date Noted  . S/P below knee amputation ([place]) 02/18/2016  . Head trauma 11/26/2015  . Memory loss 11/26/2015  . PCP NOTES >>>>>>>>>>>>>>>>>>>> 10/18/2015  . DOE (dyspnea on exertion) 09/01/2015  . S/P partial colectomy 01/08/2015  . DDD (degenerative disc disease), lumbar 06/02/2014  . 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 Colitis 01/28/2014  . Anemia 01/18/2014  . Nonspecific abnormal unspecified cardiovascular function study 10/31/2013  . Rectal bleeding 10/31/2013  . Hemorrhoids 10/31/2013  . CAD (coronary artery disease)   . Carcinoma in situ in a polyp   . Depression 09/17/2013  . Charcot's joint of right foot S/P Reconstruction 08/21/2013  . Osteoarthritis 08/21/2013  . Foot osteomyelitis, right ([place]) 07/16/2013  . Extrinsic asthma, unspecified 07/16/2013  . Chronic pain associated with significant psychosocial dysfunction 05/06/2013  . Lumbar and sacral osteoarthritis 04/22/2013  . GERD (gastroesophageal reflux disease)   . Neuropathy ([place])   . Hyperthyroidism   . Lower extremity edema 03/08/2011  . Annual physical exam 03/08/2011  . ALLERGIC RHINITIS 04/15/2010  . ORTHOSTATIC DIZZINESS 04/13/2010  . HIATAL HERNIA 12/06/2009  .  GASTRIC ULCER, HX OF 12/06/2009  . DYSPNEA 03/18/2009  . UTI'S, RECURRENT 07/07/2008  . DEGENERATIVE JOINT DISEASE 06/03/2008  . COLONIC POLYPS 01/27/2008  . DIVERTICULOSIS, COLON 01/27/2008  . IRRITABLE BOWEL SYNDROME, HX OF 01/27/2008  . PULMONARY NODULE 01/22/2008  . Hyperlipidemia 12/30/2007  . Osteoporosis 12/30/2007  . TB SKIN TEST, POSITIVE 08/09/2007  . DM II (diabetes mellitus, type II), controlled ([place]) 05/09/2007  . Reflex sympathetic dystrophy-- PAIN MNGMT  05/09/2007  . Essential hypertension 04/15/2007  . GERD 04/15/2007    Orientation RESPIRATION BLADDER Height & Weight     Self, Time, Situation, Place  Normal Continent Weight: 188 lb (85.276 kg) Height:  5\' 3"  (160 cm)  BEHAVIORAL SYMPTOMS/MOOD NEUROLOGICAL BOWEL NUTRITION STATUS      Continent Diet (Carb Modified)  AMBULATORY STATUS COMMUNICATION OF NEEDS Skin   Limited Assist Verbally Wound Vac                       Personal Care Assistance Level of Assistance  Bathing, Feeding, Dressing Bathing Assistance: Limited assistance Feeding assistance: Independent Dressing Assistance: Limited assistance     Functional Limitations Info  Sight, Hearing, Speech Sight Info: Impaired Hearing Info: Adequate Speech Info: Adequate    SPECIAL CARE FACTORS FREQUENCY  PT (By licensed PT), OT (By licensed OT)                    Contractures Contractures Info: Not present    Additional Factors Info  Allergies   Allergies Info: Cymbalta, Gabapentin, Metformin And Related, Adhesive, Cefazolin, Ciprofloxacin, Clorazepate Dipotassium, Darifenacin Hydrobromide, Dilaudid, Doxycycline, Enablex,  Levofloxacin, Lyrica, Methadone Hcl, Morphine And Related, Pentazocine Lactate, Talwin           Current Medications (02/20/2016):  This is the current hospital active medication list Current Facility-Administered Medications  Medication Dose Route Frequency Provider Last Rate Last Dose  . 0.9 %  sodium chloride  infusion   Intravenous Continuous Meridee Score V, MD      . acetaminophen (TYLENOL) tablet 650 mg  650 mg Oral Q6H PRN Newt Minion, MD       Or  . acetaminophen (TYLENOL) suppository 650 mg  650 mg Rectal Q6H PRN Newt Minion, MD      . aspirin EC tablet 325 mg  325 mg Oral Daily Newt Minion, MD   325 mg at 02/19/16 1203  . diphenhydrAMINE (BENADRYL) 12.5 MG/5ML elixir 12.5-25 mg  12.5-25 mg Oral Q4H PRN Newt Minion, MD      . docusate sodium (COLACE) capsule 100 mg  100 mg Oral BID Newt Minion, MD   100 mg at 02/19/16 1202  . meclizine (ANTIVERT) tablet 12.5 mg  12.5 mg Oral TID PRN Newt Minion, MD      . methocarbamol (ROBAXIN) tablet 500 mg  500 mg Oral Q6H PRN Newt Minion, MD   500 mg at 02/20/16 [id number]   Or  . methocarbamol (ROBAXIN) 500 mg in dextrose 5 % 50 mL IVPB  500 mg Intravenous Q6H PRN Newt Minion, MD      . metoCLOPramide (REGLAN) tablet 5-10 mg  5-10 mg Oral Q8H PRN Newt Minion, MD       Or  . metoCLOPramide (REGLAN) injection 5-10 mg  5-10 mg Intravenous Q8H PRN Newt Minion, MD      . ondansetron [facility]) tablet 4 mg  4 mg Oral Q6H PRN Newt Minion, MD       Or  . ondansetron [facility]) injection 4 mg  4 mg Intravenous Q6H PRN Meridee Score V, MD      . oxyCODONE (Oxy IR/ROXICODONE) immediate release tablet 5-15 mg  5-15 mg Oral Q3H PRN Leandrew Koyanagi, MD   15 mg at 02/20/16 [id number]  . polyethylene glycol (MIRALAX / GLYCOLAX) packet 17 g  17 g Oral Daily PRN Newt Minion, MD         Discharge Medications: Please see discharge summary for a list of discharge medications.  Relevant Imaging Results:  Relevant Lab Results:   Additional Information    Boone Master, LCSW

## 2016-02-20 NOTE — NC FL2 (Signed)
[place] LEVEL OF CARE SCREENING TOOL     IDENTIFICATION  Patient Name: [name] Birthdate: [birth year] Sex: female Admission Date (Current Location): 02/18/2016  [facility] and [place] Number:  Herbalist and Address:  [facility] [place]. [facility], [place] [address] [place], [place] [address]      Provider Number: [id number]  Attending Physician Name and Address:  Newt Minion, MD  Relative Name and Phone Number:       Current Level of Care: Hospital Recommended Level of Care: [place] Prior Approval Number:    Date Approved/Denied:   PASRR Number:  [id number]   Discharge Plan: SNF    Current Diagnoses: Patient Active Problem List   Diagnosis Date Noted  . S/P below knee amputation ([place]) 02/18/2016  . Head trauma 11/26/2015  . Memory loss 11/26/2015  . PCP NOTES >>>>>>>>>>>>>>>>>>>> 10/18/2015  . DOE (dyspnea on exertion) 09/01/2015  . S/P partial colectomy 01/08/2015  . DDD (degenerative disc disease), lumbar 06/02/2014  . 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 Colitis 01/28/2014  . Anemia 01/18/2014  . Nonspecific abnormal unspecified cardiovascular function study 10/31/2013  . Rectal bleeding 10/31/2013  . Hemorrhoids 10/31/2013  . CAD (coronary artery disease)   . Carcinoma in situ in a polyp   . Depression 09/17/2013  . Charcot's joint of right foot S/P Reconstruction 08/21/2013  . Osteoarthritis 08/21/2013  . Foot osteomyelitis, right ([place]) 07/16/2013  . Extrinsic asthma, unspecified 07/16/2013  . Chronic pain associated with significant psychosocial dysfunction 05/06/2013  . Lumbar and sacral osteoarthritis 04/22/2013  . GERD (gastroesophageal reflux disease)   . Neuropathy ([place])   . Hyperthyroidism   . Lower extremity edema 03/08/2011  . Annual physical exam 03/08/2011  . ALLERGIC RHINITIS 04/15/2010  . ORTHOSTATIC DIZZINESS 04/13/2010  . HIATAL HERNIA 12/06/2009  .  GASTRIC ULCER, HX OF 12/06/2009  . DYSPNEA 03/18/2009  . UTI'S, RECURRENT 07/07/2008  . DEGENERATIVE JOINT DISEASE 06/03/2008  . COLONIC POLYPS 01/27/2008  . DIVERTICULOSIS, COLON 01/27/2008  . IRRITABLE BOWEL SYNDROME, HX OF 01/27/2008  . PULMONARY NODULE 01/22/2008  . Hyperlipidemia 12/30/2007  . Osteoporosis 12/30/2007  . TB SKIN TEST, POSITIVE 08/09/2007  . DM II (diabetes mellitus, type II), controlled ([place]) 05/09/2007  . Reflex sympathetic dystrophy-- PAIN MNGMT  05/09/2007  . Essential hypertension 04/15/2007  . GERD 04/15/2007    Orientation RESPIRATION BLADDER Height & Weight     Self, Time, Situation, Place  Normal Continent Weight: 188 lb (85.276 kg) Height:  5\' 3"  (160 cm)  BEHAVIORAL SYMPTOMS/MOOD NEUROLOGICAL BOWEL NUTRITION STATUS      Continent Diet (Carb Modified)  AMBULATORY STATUS COMMUNICATION OF NEEDS Skin   Limited Assist Verbally Surgical wounds                       Personal Care Assistance Level of Assistance  Bathing, Feeding, Dressing Bathing Assistance: Limited assistance Feeding assistance: Independent Dressing Assistance: Limited assistance     Functional Limitations Info  Sight, Hearing, Speech Sight Info: Impaired Hearing Info: Adequate Speech Info: Adequate    SPECIAL CARE FACTORS FREQUENCY  PT (By licensed PT), OT (By licensed OT)                    Contractures Contractures Info: Not present    Additional Factors Info  Allergies   Allergies Info: Cymbalta, Gabapentin, Metformin And Related, Adhesive, Cefazolin, Ciprofloxacin, Clorazepate Dipotassium, Darifenacin Hydrobromide, Dilaudid, Doxycycline, Enablex,  Levofloxacin, Lyrica, Methadone Hcl, Morphine And Related, Pentazocine Lactate, Talwin           Current Medications (02/20/2016):  This is the current hospital active medication list Current Facility-Administered Medications  Medication Dose Route Frequency Provider Last Rate Last Dose  . 0.9 %  sodium  chloride infusion   Intravenous Continuous Meridee Score V, MD      . acetaminophen (TYLENOL) tablet 650 mg  650 mg Oral Q6H PRN Newt Minion, MD       Or  . acetaminophen (TYLENOL) suppository 650 mg  650 mg Rectal Q6H PRN Newt Minion, MD      . aspirin EC tablet 325 mg  325 mg Oral Daily Newt Minion, MD   325 mg at 02/19/16 1203  . diphenhydrAMINE (BENADRYL) 12.5 MG/5ML elixir 12.5-25 mg  12.5-25 mg Oral Q4H PRN Newt Minion, MD      . docusate sodium (COLACE) capsule 100 mg  100 mg Oral BID Newt Minion, MD   100 mg at 02/19/16 1202  . meclizine (ANTIVERT) tablet 12.5 mg  12.5 mg Oral TID PRN Newt Minion, MD      . methocarbamol (ROBAXIN) tablet 500 mg  500 mg Oral Q6H PRN Newt Minion, MD   500 mg at 02/20/16 [id number]   Or  . methocarbamol (ROBAXIN) 500 mg in dextrose 5 % 50 mL IVPB  500 mg Intravenous Q6H PRN Newt Minion, MD      . metoCLOPramide (REGLAN) tablet 5-10 mg  5-10 mg Oral Q8H PRN Newt Minion, MD       Or  . metoCLOPramide (REGLAN) injection 5-10 mg  5-10 mg Intravenous Q8H PRN Newt Minion, MD      . ondansetron [facility]) tablet 4 mg  4 mg Oral Q6H PRN Newt Minion, MD       Or  . ondansetron [facility]) injection 4 mg  4 mg Intravenous Q6H PRN Meridee Score V, MD      . oxyCODONE (Oxy IR/ROXICODONE) immediate release tablet 5-15 mg  5-15 mg Oral Q3H PRN Leandrew Koyanagi, MD   15 mg at 02/20/16 [id number]  . polyethylene glycol (MIRALAX / GLYCOLAX) packet 17 g  17 g Oral Daily PRN Newt Minion, MD         Discharge Medications: Please see discharge summary for a list of discharge medications.  Relevant Imaging Results:  Relevant Lab Results:   Additional Information    Boone Master, LCSW Weekend Coverage

## 2016-02-21 ENCOUNTER — Encounter (HOSPITAL_COMMUNITY): Payer: Self-pay | Admitting: Orthopedic Surgery

## 2016-02-21 DIAGNOSIS — K219 Gastro-esophageal reflux disease without esophagitis: Secondary | ICD-10-CM | POA: Diagnosis not present

## 2016-02-21 DIAGNOSIS — M8610 Other acute osteomyelitis, unspecified site: Secondary | ICD-10-CM | POA: Diagnosis not present

## 2016-02-21 DIAGNOSIS — M1990 Unspecified osteoarthritis, unspecified site: Secondary | ICD-10-CM | POA: Diagnosis not present

## 2016-02-21 DIAGNOSIS — Z4781 Encounter for orthopedic aftercare following surgical amputation: Secondary | ICD-10-CM | POA: Diagnosis not present

## 2016-02-21 DIAGNOSIS — H8110 Benign paroxysmal vertigo, unspecified ear: Secondary | ICD-10-CM | POA: Diagnosis not present

## 2016-02-21 DIAGNOSIS — G8929 Other chronic pain: Secondary | ICD-10-CM | POA: Diagnosis not present

## 2016-02-21 DIAGNOSIS — M79606 Pain in leg, unspecified: Secondary | ICD-10-CM | POA: Diagnosis not present

## 2016-02-21 DIAGNOSIS — K649 Unspecified hemorrhoids: Secondary | ICD-10-CM | POA: Diagnosis not present

## 2016-02-21 DIAGNOSIS — D649 Anemia, unspecified: Secondary | ICD-10-CM | POA: Diagnosis not present

## 2016-02-21 DIAGNOSIS — R7611 Nonspecific reaction to tuberculin skin test without active tuberculosis: Secondary | ICD-10-CM | POA: Diagnosis not present

## 2016-02-21 DIAGNOSIS — E119 Type 2 diabetes mellitus without complications: Secondary | ICD-10-CM | POA: Diagnosis not present

## 2016-02-21 DIAGNOSIS — M810 Age-related osteoporosis without current pathological fracture: Secondary | ICD-10-CM | POA: Diagnosis not present

## 2016-02-21 DIAGNOSIS — Z89511 Acquired absence of right leg below knee: Secondary | ICD-10-CM | POA: Diagnosis not present

## 2016-02-21 DIAGNOSIS — L97521 Non-pressure chronic ulcer of other part of left foot limited to breakdown of skin: Secondary | ICD-10-CM | POA: Diagnosis not present

## 2016-02-21 DIAGNOSIS — H9190 Unspecified hearing loss, unspecified ear: Secondary | ICD-10-CM | POA: Diagnosis not present

## 2016-02-21 DIAGNOSIS — I82409 Acute embolism and thrombosis of unspecified deep veins of unspecified lower extremity: Secondary | ICD-10-CM | POA: Diagnosis not present

## 2016-02-21 DIAGNOSIS — R2689 Other abnormalities of gait and mobility: Secondary | ICD-10-CM | POA: Diagnosis not present

## 2016-02-21 DIAGNOSIS — M86461 Chronic osteomyelitis with draining sinus, right tibia and fibula: Secondary | ICD-10-CM | POA: Diagnosis not present

## 2016-02-21 DIAGNOSIS — R42 Dizziness and giddiness: Secondary | ICD-10-CM | POA: Diagnosis not present

## 2016-02-21 DIAGNOSIS — J9811 Atelectasis: Secondary | ICD-10-CM | POA: Diagnosis not present

## 2016-02-21 DIAGNOSIS — I2510 Atherosclerotic heart disease of native coronary artery without angina pectoris: Secondary | ICD-10-CM | POA: Diagnosis not present

## 2016-02-21 DIAGNOSIS — M4726 Other spondylosis with radiculopathy, lumbar region: Secondary | ICD-10-CM | POA: Diagnosis not present

## 2016-02-21 DIAGNOSIS — F329 Major depressive disorder, single episode, unspecified: Secondary | ICD-10-CM | POA: Diagnosis not present

## 2016-02-21 DIAGNOSIS — R110 Nausea: Secondary | ICD-10-CM | POA: Diagnosis not present

## 2016-02-21 DIAGNOSIS — E039 Hypothyroidism, unspecified: Secondary | ICD-10-CM | POA: Diagnosis not present

## 2016-02-21 DIAGNOSIS — J189 Pneumonia, unspecified organism: Secondary | ICD-10-CM | POA: Diagnosis not present

## 2016-02-21 DIAGNOSIS — I10 Essential (primary) hypertension: Secondary | ICD-10-CM | POA: Diagnosis not present

## 2016-02-21 DIAGNOSIS — J309 Allergic rhinitis, unspecified: Secondary | ICD-10-CM | POA: Diagnosis not present

## 2016-02-21 DIAGNOSIS — I739 Peripheral vascular disease, unspecified: Secondary | ICD-10-CM | POA: Diagnosis not present

## 2016-02-21 DIAGNOSIS — M869 Osteomyelitis, unspecified: Secondary | ICD-10-CM | POA: Diagnosis not present

## 2016-02-21 DIAGNOSIS — G894 Chronic pain syndrome: Secondary | ICD-10-CM | POA: Diagnosis not present

## 2016-02-21 DIAGNOSIS — E1140 Type 2 diabetes mellitus with diabetic neuropathy, unspecified: Secondary | ICD-10-CM | POA: Diagnosis not present

## 2016-02-21 DIAGNOSIS — E11618 Type 2 diabetes mellitus with other diabetic arthropathy: Secondary | ICD-10-CM | POA: Diagnosis not present

## 2016-02-21 DIAGNOSIS — N390 Urinary tract infection, site not specified: Secondary | ICD-10-CM | POA: Diagnosis not present

## 2016-02-21 LAB — GLUCOSE, CAPILLARY
Glucose-Capillary: 124 mg/dL — ABNORMAL HIGH (ref 65–99)
Glucose-Capillary: 143 mg/dL — ABNORMAL HIGH (ref 65–99)
Glucose-Capillary: 116 mg/dL — ABNORMAL HIGH (ref 65–99)

## 2016-02-21 MED ORDER — HYDROCODONE-ACETAMINOPHEN 10-325 MG PO TABS: 1.0000 | ORAL_TABLET | Freq: Four times a day (QID) | ORAL | Status: DC | PRN

## 2016-02-21 NOTE — Care Management Important Message (Signed)
Important Message  Patient Details  Name: [name] MRN: [id number] Date of Birth: 01-02-1935   Medicare Important Message Given:  Yes    Selby Slovacek P Noga Fogg 02/21/2016, 1:59 PM

## 2016-02-21 NOTE — Discharge Summary (Signed)
Physician Discharge Summary  Patient ID: [name] MRN: [id number] DOB/AGE: [birth year] 81 y.o.  Admit date: 02/18/2016 Discharge date: 02/21/2016  Admission Diagnoses:Charcot collapse with osteomyelitis and ulceration right foot  Discharge Diagnoses:  Active Problems:   S/P below knee amputation [facility])   Discharged Condition: stable  Hospital Course: Patient's hospital course was essentially unremarkable. She underwent transtibial amputation. Postoperatively she progressed slowly was discharged to skilled nursing in stable condition.  Consults: None  Significant Diagnostic Studies: labs: Routine labs  Treatments: surgery: See operative note  Discharge Exam: Blood pressure 109/55, pulse 90, temperature 99 F (37.2 C), temperature source Oral, resp. rate 16, height 5\' 3"  (1.6 m), weight 85.276 kg (188 lb), SpO2 98 %. Incision/Wound: Wound VAC functioning well  Disposition: 01-Home or Self Care     Medication List    ASK your doctor about these medications        Alpha-Lipoic Acid 600 MG Caps  Take 600 mg by mouth 2 (two) times daily.     amLODipine 2.5 MG tablet  Commonly known as:  NORVASC  Take 1 tablet (2.5 mg total) by mouth daily.     aspirin EC 81 MG tablet  Take 1 tablet (81 mg total) by mouth daily.     Cinnamon 500 MG Tabs  Take 1 tablet by mouth 2 (two) times daily.     diclofenac sodium 1 % Gel  Commonly known as:  VOLTAREN  Apply 1 application topically daily as needed (pain).     erythromycin ophthalmic ointment  Place 1 application into both eyes daily.     HYDROcodone-acetaminophen 10-325 MG tablet  Commonly known as:  NORCO  Take 1 tablet by mouth every 6 (six) hours as needed for moderate pain or severe pain.     lidocaine 5 %  Commonly known as:  LIDODERM  Place 1 patch onto the skin daily. Remove & Discard patch within 12 hours or as directed by MD     lisinopril 10 MG tablet  Commonly known as:  PRINIVIL,ZESTRIL  Take 1 tablet (10  mg total) by mouth daily.     meclizine 12.5 MG tablet  Commonly known as:  ANTIVERT  Take 1 tablet (12.5 mg total) by mouth 3 (three) times daily as needed for dizziness.     metoprolol succinate 25 MG 24 hr tablet  Commonly known as:  TOPROL-XL  take 1 tablet by mouth once daily     nabumetone 750 MG tablet  Commonly known as:  RELAFEN  Take 750 mg by mouth 2 (two) times daily.     NITROSTAT 0.4 MG SL tablet  Generic drug:  nitroGLYCERIN  place 1 tablet under the tongue if needed every 5 minutes for chest pain     nortriptyline 10 MG capsule  Commonly known as:  PAMELOR  Take 10 mg by mouth every evening.     omeprazole 20 MG capsule  Commonly known as:  PRILOSEC  Take 1 capsule (20 mg total) by mouth 2 (two) times daily.     OVER THE COUNTER MEDICATION  3 (three) times daily. Berberorine Gluco Defense     OVER THE COUNTER MEDICATION  Take 1 capsule by mouth 2 (two) times daily. Integrative Digestive Formula     predniSONE 10 MG tablet  Commonly known as:  DELTASONE  Take 5-10 mg by mouth See admin instructions. PT TO TAKE 10 MG DAILY FOR 2 WEEKS, AND THEN TO TAKE 5 MG FOR 2 WEEKS  PRESERVISION AREDS 2 Caps  Take 1 capsule by mouth 2 (two) times daily.     ranitidine 150 MG tablet  Commonly known as:  ZANTAC  Take 150 mg by mouth every evening. Reported on 12/29/2015     REFRESH OP  Place 1 drop into both eyes daily as needed (dry eyes).     sulfamethoxazole-trimethoprim 800-160 MG tablet  Commonly known as:  BACTRIM DS,SEPTRA DS  Take 1 tablet by mouth 2 (two) times daily.     tiZANidine 4 MG tablet  Commonly known as:  ZANAFLEX  Take 4 mg by mouth every 8 (eight) hours as needed for muscle spasms.     trimethoprim 100 MG tablet  Commonly known as:  TRIMPEX  Take 100 mg by mouth every other day.     Vitamin B-12 2500 MCG Subl  Place 2,500 mcg under the tongue every evening. Reported on 11/24/2015     Vitamin D3 5000 units Caps  Take 5,000 Units by mouth  every evening. Reported on 11/24/2015           Follow-up Information    Follow up with DUDA,MARCUS V, MD In 1 week.   Specialty:  Orthopedic Surgery   Contact information:   [place] [address] [phone]       Signed: Newt Minion 02/21/2016, 6:27 AM

## 2016-02-21 NOTE — Clinical Social Work Placement (Signed)
   CLINICAL SOCIAL WORK PLACEMENT  NOTE 02/21/16 - DISCHARGED TO [facility]  Date:  02/21/2016  Patient Details  Name: [name] MRN: [id number] Date of Birth: 1935-05-30  Clinical Social Work is seeking post-discharge placement for this patient at the [place] level of care (*CSW will initial, date and re-position this form in  chart as items are completed):  Yes   Patient/family provided with [facility] Work Department's list of facilities offering this level of care within the geographic area requested by the patient (or if unable, by the patient's family).  Yes   Patient/family informed of their freedom to choose among providers that offer the needed level of care, that participate in Medicare, Medicaid or managed care program needed by the patient, have an available bed and are willing to accept the patient.  Yes   Patient/family informed of [facility]'s ownership interest in [facility] and [facility], as well as of the fact that they are under no obligation to receive care at these facilities.  PASRR submitted to EDS on       PASRR number received on       Existing PASRR number confirmed on 02/20/16     FL2 transmitted to all facilities in geographic area requested by pt/family on 02/20/16     FL2 transmitted to all facilities within larger geographic area on       Patient informed that his/her managed care company has contracts with or will negotiate with certain facilities, including the following:        Yes   Patient/family informed of bed offers received.  Patient chooses bed at [facility] recommends and patient chooses bed at      Patient to be transferred to [facility] [place] on  02/21/16.  Patient to be transferred to facility by  ambulance     Patient family notified on  02/21/16 of transfer.  Name of family member notified:        PHYSICIAN       Additional Comment:     _______________________________________________ Sable Feil, LCSW 02/21/2016, 7:53 PM

## 2016-02-21 NOTE — Progress Notes (Signed)
Pt discharged to [facility] via hospital transportation.  Pt had all belongings.  Pt RLE connected to portable wound vac.  Surgial RLE site WNL.  Report given to [facility] RN at [facility] at 1537.  No other concerns at this time.  Vital stable.  Pain controlled with oral medications.  Iantha Fallen RN 02/21/2016 1901

## 2016-02-23 DIAGNOSIS — G894 Chronic pain syndrome: Secondary | ICD-10-CM | POA: Diagnosis not present

## 2016-02-23 DIAGNOSIS — R2689 Other abnormalities of gait and mobility: Secondary | ICD-10-CM | POA: Diagnosis not present

## 2016-02-25 DIAGNOSIS — E119 Type 2 diabetes mellitus without complications: Secondary | ICD-10-CM | POA: Diagnosis not present

## 2016-02-25 DIAGNOSIS — N390 Urinary tract infection, site not specified: Secondary | ICD-10-CM | POA: Diagnosis not present

## 2016-02-25 DIAGNOSIS — I10 Essential (primary) hypertension: Secondary | ICD-10-CM | POA: Diagnosis not present

## 2016-02-28 DIAGNOSIS — G894 Chronic pain syndrome: Secondary | ICD-10-CM | POA: Diagnosis not present

## 2016-03-01 DIAGNOSIS — Z89511 Acquired absence of right leg below knee: Secondary | ICD-10-CM | POA: Diagnosis not present

## 2016-03-01 DIAGNOSIS — G894 Chronic pain syndrome: Secondary | ICD-10-CM | POA: Diagnosis not present

## 2016-03-02 ENCOUNTER — Ambulatory Visit: Payer: Medicare Other | Admitting: Gastroenterology

## 2016-03-02 ENCOUNTER — Encounter: Payer: Medicare Other | Admitting: Gastroenterology

## 2016-03-06 DIAGNOSIS — G894 Chronic pain syndrome: Secondary | ICD-10-CM | POA: Diagnosis not present

## 2016-03-07 ENCOUNTER — Telehealth: Payer: Self-pay | Admitting: *Deleted

## 2016-03-07 NOTE — Telephone Encounter (Addendum)
Dr Jaynee Eagles- [facility]. Let me know if you want to do something different.   Called daughter back. She stated pt had an infection in foot and had to have amputation. She had the surgery 2 weeks ago this past Friday. She is in pt for rehab right now. She states she thinks all the physical therapy, ect is causing nausea to be worse. Rehab suggested patient  f/u with neurology. I made f/u for 5/24 at 4pm, check in 345pm. Daughter verbalized understanding and appreciation.

## 2016-03-07 NOTE — Telephone Encounter (Signed)
Thanks. Spoke to daughter, will see her on the 24th.

## 2016-03-07 NOTE — Telephone Encounter (Signed)
Pt daughter called, requesting a sooner appt for pt. Please advise. [phone]

## 2016-03-08 DIAGNOSIS — G894 Chronic pain syndrome: Secondary | ICD-10-CM | POA: Diagnosis not present

## 2016-03-10 DIAGNOSIS — I739 Peripheral vascular disease, unspecified: Secondary | ICD-10-CM | POA: Diagnosis not present

## 2016-03-10 DIAGNOSIS — M8610 Other acute osteomyelitis, unspecified site: Secondary | ICD-10-CM | POA: Diagnosis not present

## 2016-03-15 ENCOUNTER — Ambulatory Visit (INDEPENDENT_AMBULATORY_CARE_PROVIDER_SITE_OTHER): Payer: Medicare Other | Admitting: Neurology

## 2016-03-15 ENCOUNTER — Encounter: Payer: Self-pay | Admitting: Neurology

## 2016-03-15 VITALS — BP 98/68 | HR 86 | Resp 20

## 2016-03-15 DIAGNOSIS — R110 Nausea: Secondary | ICD-10-CM | POA: Diagnosis not present

## 2016-03-15 DIAGNOSIS — H8110 Benign paroxysmal vertigo, unspecified ear: Secondary | ICD-10-CM

## 2016-03-15 DIAGNOSIS — H9190 Unspecified hearing loss, unspecified ear: Secondary | ICD-10-CM

## 2016-03-15 MED ORDER — ONDANSETRON 4 MG PO TBDP: 4.0000 mg | ORAL_TABLET | Freq: Three times a day (TID) | ORAL | Status: DC | PRN

## 2016-03-15 NOTE — Patient Instructions (Signed)
Remember to drink plenty of fluid, eat healthy meals and do not skip any meals. Try to eat protein with a every meal and eat a healthy snack such as fruit or nuts in between meals. Try to keep a regular sleep-wake schedule and try to exercise daily, particularly in the form of walking, 20-30 minutes a day, if you can.   As far as your medications are concerned, I would like to suggest; Zofran as needed for vertigo and car rides  As far as diagnostic testing: Vestibular Therapy, Dr. Minna Merritts ENT  I would like to see you back in 6 months, sooner if we need to. Please call us with any interim questions, concerns, problems, updates or refill requests.   Our phone number is [phone]. We also have an after hours call service for urgent matters and there is a physician on-call for urgent questions. For any emergencies you know to call 911 or go to the nearest emergency room

## 2016-03-15 NOTE — Progress Notes (Addendum)
[facility]    Provider:  Dr Jaynee Eagles Referring Provider: Colon Branch, MD Primary Care Physician:  [name], MD  CC: headaches  Addendum; Saw Dr. Ernesto Rutherford, no otologic issues, likely BPPV. 03/2016  She is having episodes of getting very sick. She starts having motion sickness. Lots of nausea. She has vertigo. Feels like her head is spinning. Worse when she moves her head. She gets the vertigo when she moves her head. She feels like she has to sit very still and not move her head or the vertigo will come back. This is not something new. It has become more severe. These episodes have been going on for at least a year and she also had in the past. It happens in the afternoons. Sometimes after she eats. She has decreased hearing. Has not seen anyone fore   HPI: [name] is a 81 y.o. female here as a referral from Dr. Larose Kells for headaches., PMHx spinal stenosis, HTN, neuropathy, CAD, DM2, arthritis, RSD, fatty liver. Patient fell on her steps while helping with groceries and she was flat on her back. She uses a walker and wasn't using it, helping lift groceries. Her head hit the bricks. She did not go to the hospital. This happened mid December. She followed up on her back and neck and all was good. She hasn't felt right since then, a few weeks ago she was in the bathroom and all of a sudden she saw lights and stars blinking on the right side, went on for 30 minutes. It has happened a few times, happened less since them. She saw Dr. Bing Plume and they could not see her so she went to Dr. Katy Fitch. She has a history of migraines when she was a teenager and lasted into her thirties. Sister with very bad migraines. After the lights stop she gets a headache. No double vision, no blurry vision, but she does see visual abnormalities like something running across her vision, she is having more headaches. She is also having dizziness. She has been more irritable since hitting her head, worsening  memory problems. She is having blurry vision. She takes hydrocodone daily. She takes advil sometimes not too often. They don't last that long, not that severe 5/10 for 20 minutes.   Reviewed notes, labs and imaging from outside physicians, which showed; She was seen by Dr Katy Fitch for an emergency visit. Her head had been hurting since a fall. She had a "light show" in her right eye with some vision changes. VA was od 20/30, os 20/25, external exam normal, slit lamp nml, fundus nml, glaucoma borderline. He suspected migraine aura. Visual fields were performed. .  crp 4.2, esr 2.   Review of Systems: Patient complains of symptoms per HPI as well as the following symptoms: blurred vision, eye pain, SOB, feelin ghot, feeling cold. Pertinent negatives per HPI. All others negative.  Social History   Social History  . Marital Status: Divorced    Spouse Name: N/A  . Number of Children: 3  . Years of Education: 15   Occupational History  . Retired      from [employer]    Social History Main Topics  . Smoking status: Never Smoker   . Smokeless tobacco: Never Used     Comment: tried for a few months in college.   . Alcohol Use: No  . Drug Use: No  . Sexual Activity: No   Other Topics Concern  . Not on file  Social History Narrative   Lives: daughter lives w/ her and another daughter lives in town   1 son in [place]   Son comes home every 4 weeks to visit   Caffeine use:  Tea 1/day w/ dinner   Coffee occass              Family History  Problem Relation Age of Onset  . Migraines Sister   . Hypertension    . Breast cancer    . Heart disease Mother     mitral valve replaced  . Arthritis Mother   . Colon cancer Neg Hx   . Esophageal cancer Neg Hx   . Stomach cancer Neg Hx   . Rectal cancer Neg Hx   . Diabetic kidney disease Daughter   . Headache Sister   . Diabetes Father   . Stroke Father     Past Medical History  Diagnosis Date  . Hyperlipidemia     a. patient  unwilling to use statins.  . Osteoporosis   . DVT (deep venous thrombosis) ([place]) 12/2007  . PE (pulmonary embolism) 12/2007    a. PE/DVT after neck surgery 2009. b. coumadin d/c 10-2008.  . Pulmonary nodule     incidental per CT:  Pet scan 4-9: likely benign, CT 08-2009 no change, no further CTs (Dr. Gwenette Greet)  . Hemorrhoids   . IBS (irritable bowel syndrome)   . Diverticulosis   . PPD positive   . Macular degeneration 03/2009    Dr. Rosana Hoes  . Allergic rhinitis   . Dyspnea     a. Chronic, extensive w/u see OV note 09-2010. b. [place] 2011: ormal with RA 5 RV 31/2 PA 27/12 (19) PCW 10 CO normal. No evidence of shunting with sitting up in cath lab. c. CPX 2011: see report. d. Prior fluoro of diaphragm -  R diaphragm elevated at rest but both moved with inspiration.   Marland Kitchen Spinal stenosis   . GERD (gastroesophageal reflux disease)     a. Hx GERD/esophageal dysmotility followed by Dr. Olevia Perches.   Marland Kitchen Hypertension   . Neuropathy ([place])     a. Hands, feet, legs.  . Orthostatic hypotension   . CAD (coronary artery disease)     a. Nonobst in 2011. b. Abnormal nuc 09/2013 -> s/p cutting balloon to D2; mild LAD disease 10/31/13.  . Osteomyelitis ([place])     a. Adm 04/2013: Charcot collapse of the right foot with osteomyelitis and ulceration, s/p excision.  . Venous insufficiency     a. Contributing to LEE.  . Type II diabetes mellitus ([place])   . DJD (degenerative joint disease)   . Arthritis     "back; ankles; hands; knees" (10/31/2013)  . Recurrent UTI   . 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 1994 - malignant polyp removed during colonoscopy.  . Hiatal hernia     a. s/p Nissen fundoplication 7893.  Marland Kitchen RSD (reflex sympathetic dystrophy)     a. Chronic pain.  . Abnormal echocardiogram     09/2013: mild LVH, mild focal basal hypertrophy of septum, EF 60-65%, normal WM, grade 1 diastolic dysfunction, MAC, mild LAE, ASA, PASP 34. Possible oscillating MV density - reviewed by MD and felt there was no significant  abnormality other than MAC noted with mitral valve and did not require TEE.  . Adenomatous colon polyp   . Pneumonia   . Fatty liver   . PONV (postoperative nausea and vomiting) 2009    neck  surgery  . Headache     Optic migraine  . Cancer [facility])     cancerous polyps  . Macular degeneration     Past Surgical History  Procedure Laterality Date  . Nissen fundoplication  11/27/9561  . Cholecystectomy  03/2002  . Cataract extraction w/ intraocular lens  implant, bilateral  2004    feb 2004 left, aug 2004 right  . Rotator cuff repair Right 07/2007    Dr. Percell Miller  . Anterior cervical decomp/discectomy fusion  12/18/07    For OA,  Dr. Lorin Mercy:  fu by a PE  . Toe amputation Right 08/2008    3rd toe, Dr. Sharol Given due to osteomyelitis  . Nasal septum surgery  09/1963  . Vein ligation Bilateral 03/1966  . Carpal tunnel release Left 09/2011  . Amputation  03/06/2012    Procedure: AMPUTATION FOOT;  Surgeon: Newt Minion, MD;  Location: [place];  Service: Orthopedics;  Laterality: Left;  FIFTH RAY AMPUTATION   . Vena cava filter placement  01/2010    green filter; "due to blood clots"  . Ankle fusion  09/27/2012    Procedure: ANKLE FUSION;  Surgeon: Newt Minion, MD;  Location: [place];  Service: Orthopedics;  Laterality: Left;  Left Tibiocalcaneal Fusion  . Ankle fusion Right 05/09/2013    Procedure: ANKLE FUSION;  Surgeon: Newt Minion, MD;  Location: [place];  Service: Orthopedics;  Laterality: Right;  Excision Osteomyelitis Base 1st MT Right Foot, Fusion Medial Column  . Cardiac catheterization  05/2010     at [facility]  . Coronary angioplasty  10/31/2013  . Vaginal hysterectomy  03/1975  . Ankle surgery Left apr & june 1991  . Cyst removal hand  06/2003  . Rotator cuff repair Left 11/2004  . Wisdom tooth extraction  06/2007  . Foot bone excision Right 06/2009  . Left heart catheterization with coronary angiogram N/A 10/31/2013    Procedure: LEFT HEART CATHETERIZATION WITH CORONARY ANGIOGRAM;  Surgeon:  Jettie Booze, MD;  Location: [facility] CATH LAB;  Service: Cardiovascular;  Laterality: N/A;  . Percutaneous coronary intervention-balloon only  10/31/2013    Procedure: PERCUTANEOUS CORONARY INTERVENTION-BALLOON ONLY;  Surgeon: Jettie Booze, MD;  Location: [facility] CATH LAB;  Service: Cardiovascular;;  . Laparoscopic right hemi colectomy N/A 01/08/2015    Procedure: LAPAROSCOPIC ASSISTED RIGHT HEMI COLECTOMY;  Surgeon: Johnathan Hausen, MD;  Location: [facility];  Service: General;  Laterality: N/A;  . Colonoscopy      numerous times  . Amputation Right 02/18/2016    Procedure: AMPUTATION BELOW KNEE;  Surgeon: Newt Minion, MD;  Location: [place];  Service: Orthopedics;  Laterality: Right;    Current Outpatient Prescriptions  Medication Sig Dispense Refill  . Alpha-Lipoic Acid 600 MG CAPS Take 600 mg by mouth 2 (two) times daily.    Marland Kitchen amLODipine (NORVASC) 2.5 MG tablet Take 1 tablet (2.5 mg total) by mouth daily. (Patient taking differently: Take 2.5 mg by mouth at bedtime. ) 180 tablet 3  . aspirin EC 81 MG tablet Take 1 tablet (81 mg total) by mouth daily. (Patient taking differently: Take 81 mg by mouth at bedtime. )    . Cholecalciferol (VITAMIN D3) 5000 UNITS CAPS Take 5,000 Units by mouth every evening. Reported on 11/24/2015    . Cinnamon 500 MG TABS Take 1 tablet by mouth 2 (two) times daily.    . Cyanocobalamin (VITAMIN B-12) 2500 MCG SUBL Place 2,500 mcg under the tongue every evening. Reported on 11/24/2015    .  diclofenac sodium (VOLTAREN) 1 % GEL Apply 1 application topically daily as needed (pain).    Marland Kitchen erythromycin ophthalmic ointment Place 1 application into both eyes daily.    Marland Kitchen HYDROcodone-acetaminophen (NORCO) 10-325 MG tablet Take 1 tablet by mouth every 6 (six) hours as needed for moderate pain or severe pain. 120 tablet 0  . HYDROcodone-acetaminophen (NORCO) 10-325 MG tablet Take 1 tablet by mouth every 6 (six) hours as needed. 60 tablet 0  . lidocaine (LIDODERM) 5 % Place 1 patch  onto the skin daily. Remove & Discard patch within 12 hours or as directed by MD 30 patch 0  . lisinopril (PRINIVIL,ZESTRIL) 10 MG tablet Take 1 tablet (10 mg total) by mouth daily. 90 tablet 3  . meclizine (ANTIVERT) 12.5 MG tablet Take 1 tablet (12.5 mg total) by mouth 3 (three) times daily as needed for dizziness. 90 tablet 0  . metoprolol succinate (TOPROL-XL) 25 MG 24 hr tablet take 1 tablet by mouth once daily (Patient taking differently: take 1 tablet by mouth once daily at bedtime) 30 tablet 11  . Multiple Vitamins-Minerals (PRESERVISION AREDS 2) CAPS Take 1 capsule by mouth 2 (two) times daily.    . nabumetone (RELAFEN) 750 MG tablet Take 750 mg by mouth 2 (two) times daily.    Marland Kitchen NITROSTAT 0.4 MG SL tablet place 1 tablet under the tongue if needed every 5 minutes for chest pain 100 tablet 1  . nortriptyline (PAMELOR) 10 MG capsule Take 10 mg by mouth every evening.     Marland Kitchen omeprazole (PRILOSEC) 20 MG capsule Take 1 capsule (20 mg total) by mouth 2 (two) times daily. 180 capsule 3  . OVER THE COUNTER MEDICATION Take 1 capsule by mouth 2 (two) times daily. Integrative Digestive Formula    . OVER THE COUNTER MEDICATION 3 (three) times daily. Berberorine Gluco Defense    . Polyvinyl Alcohol-Povidone (REFRESH OP) Place 1 drop into both eyes daily as needed (dry eyes).    . ranitidine (ZANTAC) 150 MG tablet Take 150 mg by mouth every evening. Reported on 12/29/2015    . sulfamethoxazole-trimethoprim (BACTRIM DS,SEPTRA DS) 800-160 MG tablet Take 1 tablet by mouth 2 (two) times daily.  0  . tiZANidine (ZANAFLEX) 4 MG tablet Take 4 mg by mouth every 8 (eight) hours as needed for muscle spasms.    Marland Kitchen trimethoprim (TRIMPEX) 100 MG tablet Take 100 mg by mouth every other day.     No current facility-administered medications for this visit.    Allergies as of 03/15/2016 - Review Complete 03/15/2016  Allergen Reaction Noted  . Cymbalta [duloxetine hcl] Swelling 09/25/2012  . Gabapentin Swelling  09/25/2012  . Metformin and related Other (See Comments) 12/10/2015  . Adhesive [tape] Rash 07/17/2011  . Cefazolin Hives 03/06/2012  . Ciprofloxacin Other (See Comments) 01/31/2013  . Clorazepate dipotassium Other (See Comments)   . Darifenacin hydrobromide Other (See Comments) 03/30/2009  . Dilaudid [hydromorphone hcl] Other (See Comments) 03/11/2010  . Doxycycline Rash 11/03/2008  . Enablex [darifenacin hydrobromide er] Other (See Comments) 03/06/2012  . Levofloxacin Other (See Comments) 01/08/2014  . Lyrica [pregabalin] Swelling 05/08/2013  . Methadone hcl Other (See Comments)   . Morphine and related Other (See Comments) 10/31/2013  . Pentazocine lactate Other (See Comments) 07/17/2011  . Talwin [pentazocine] Other (See Comments) 12/04/2012    Vitals: BP 98/68 mmHg  Pulse 86  Resp 20  Ht   Wt  Last Weight:  Wt Readings from Last 1 Encounters:  02/18/16 188 lb (85.276  kg)   Last Height:   Ht Readings from Last 1 Encounters:  02/18/16 _0  (1.6 m)     Physical exam: Exam: Gen: NAD, conversant, well nourised, obese, well groomed  CV: RRR, no MRG. No Carotid Bruits.  Eyes: Conjunctivae clear without exudates or hemorrhage  Neuro: Detailed Neurologic Exam  Speech:  Speech is normal; fluent and spontaneous with normal comprehension.  Cognition:  The patient is oriented to person, place, and time;   recent and remote memory intact;   language fluent;   normal attention, concentration,   fund of knowledge Cranial Nerves:  The pupils are equal, round, and reactive to light. The fundi are normal and spontaneous venous pulsations are present. Visual fields are full to finger confrontation. Extraocular movements are intact. Trigeminal sensation is intact and the muscles of mastication are normal. The face is symmetric. The palate elevates in the midline. Hearing intact. Voice is normal. Shoulder shrug is normal. The tongue has  normal motion without fasciculations.   Coordination:  No dysmetria  Gait:  With a walker, slow and mildly antalgic due to right leg RSD with brace  Motor Observation:  No asymmetry, no atrophy, and no involuntary movements noted.   Strength:  Left leg in a brace but strength otherwise strength intact, right BKA   Sensation: intact to LT   Reflex Exam:  DTR's: cannot test left LL due to RSD or right leg dur to BKA but otherwise deep tendon reflexes in the upper extremities brisk     Assessment/Plan: This is a very pleasant 81 year old female here for vertigo appears to be BPPV. MRI of the brain in 11/2015 did not show any stroke to explain this. Hearing loss, vertigo  Meclizine and zofran prn ENT evaluation Vestibular therapy She denies significant allergy or anaphylaxis in the past, no idea about the pentazocine, she says it made her nauseated possibly without true allergy,she understands the risks of taking zofran, stop for any side effects including hives, breathing problems, rash or anything concerning  Sarina Ill, MD  [facility] [address] [place], [place] [address]  Phone [phone] Fax [phone]  A total of 30 minutes was spent face-to-face with this patient. Over half this time was spent on counseling patient on the vertigo diagnosis and different diagnostic and therapeutic options available.

## 2016-03-16 DIAGNOSIS — L97521 Non-pressure chronic ulcer of other part of left foot limited to breakdown of skin: Secondary | ICD-10-CM | POA: Diagnosis not present

## 2016-03-17 DIAGNOSIS — M869 Osteomyelitis, unspecified: Secondary | ICD-10-CM | POA: Diagnosis not present

## 2016-03-27 DIAGNOSIS — Z87440 Personal history of urinary (tract) infections: Secondary | ICD-10-CM | POA: Diagnosis not present

## 2016-03-27 DIAGNOSIS — I872 Venous insufficiency (chronic) (peripheral): Secondary | ICD-10-CM | POA: Diagnosis not present

## 2016-03-27 DIAGNOSIS — K219 Gastro-esophageal reflux disease without esophagitis: Secondary | ICD-10-CM | POA: Diagnosis not present

## 2016-03-27 DIAGNOSIS — I10 Essential (primary) hypertension: Secondary | ICD-10-CM | POA: Diagnosis not present

## 2016-03-27 DIAGNOSIS — Z86711 Personal history of pulmonary embolism: Secondary | ICD-10-CM | POA: Diagnosis not present

## 2016-03-27 DIAGNOSIS — M4800 Spinal stenosis, site unspecified: Secondary | ICD-10-CM | POA: Diagnosis not present

## 2016-03-27 DIAGNOSIS — M1990 Unspecified osteoarthritis, unspecified site: Secondary | ICD-10-CM | POA: Diagnosis not present

## 2016-03-27 DIAGNOSIS — Z4781 Encounter for orthopedic aftercare following surgical amputation: Secondary | ICD-10-CM | POA: Diagnosis not present

## 2016-03-27 DIAGNOSIS — M810 Age-related osteoporosis without current pathological fracture: Secondary | ICD-10-CM | POA: Diagnosis not present

## 2016-03-27 DIAGNOSIS — Z86718 Personal history of other venous thrombosis and embolism: Secondary | ICD-10-CM | POA: Diagnosis not present

## 2016-03-27 DIAGNOSIS — E1142 Type 2 diabetes mellitus with diabetic polyneuropathy: Secondary | ICD-10-CM | POA: Diagnosis not present

## 2016-03-27 DIAGNOSIS — Z89611 Acquired absence of right leg above knee: Secondary | ICD-10-CM | POA: Diagnosis not present

## 2016-03-27 DIAGNOSIS — I2510 Atherosclerotic heart disease of native coronary artery without angina pectoris: Secondary | ICD-10-CM | POA: Diagnosis not present

## 2016-03-29 DIAGNOSIS — N3000 Acute cystitis without hematuria: Secondary | ICD-10-CM | POA: Diagnosis not present

## 2016-03-29 DIAGNOSIS — N3020 Other chronic cystitis without hematuria: Secondary | ICD-10-CM | POA: Diagnosis not present

## 2016-03-30 ENCOUNTER — Ambulatory Visit (INDEPENDENT_AMBULATORY_CARE_PROVIDER_SITE_OTHER): Payer: Medicare Other | Admitting: Cardiology

## 2016-03-30 ENCOUNTER — Encounter: Payer: Self-pay | Admitting: Cardiology

## 2016-03-30 VITALS — BP 120/60 | HR 76 | Ht 63.0 in

## 2016-03-30 DIAGNOSIS — Z789 Other specified health status: Secondary | ICD-10-CM

## 2016-03-30 DIAGNOSIS — I10 Essential (primary) hypertension: Secondary | ICD-10-CM | POA: Diagnosis not present

## 2016-03-30 DIAGNOSIS — I2510 Atherosclerotic heart disease of native coronary artery without angina pectoris: Secondary | ICD-10-CM

## 2016-03-30 DIAGNOSIS — E785 Hyperlipidemia, unspecified: Secondary | ICD-10-CM | POA: Diagnosis not present

## 2016-03-30 DIAGNOSIS — R0789 Other chest pain: Secondary | ICD-10-CM | POA: Diagnosis not present

## 2016-03-30 DIAGNOSIS — I509 Heart failure, unspecified: Secondary | ICD-10-CM | POA: Diagnosis not present

## 2016-03-30 DIAGNOSIS — I5033 Acute on chronic diastolic (congestive) heart failure: Secondary | ICD-10-CM | POA: Diagnosis not present

## 2016-03-30 DIAGNOSIS — N179 Acute kidney failure, unspecified: Secondary | ICD-10-CM | POA: Insufficient documentation

## 2016-03-30 DIAGNOSIS — Z889 Allergy status to unspecified drugs, medicaments and biological substances status: Secondary | ICD-10-CM

## 2016-03-30 DIAGNOSIS — N183 Chronic kidney disease, stage 3 unspecified: Secondary | ICD-10-CM

## 2016-03-30 DIAGNOSIS — R079 Chest pain, unspecified: Secondary | ICD-10-CM

## 2016-03-30 DIAGNOSIS — I2583 Coronary atherosclerosis due to lipid rich plaque: Secondary | ICD-10-CM

## 2016-03-30 LAB — COMPREHENSIVE METABOLIC PANEL
ALT: 7 U/L (ref 6–29)
AST: 9 U/L — ABNORMAL LOW (ref 10–35)
Albumin: 3.7 g/dL (ref 3.6–5.1)
Alkaline Phosphatase: 67 U/L (ref 33–130)
BUN: 17 mg/dL (ref 7–25)
CO2: 18 mmol/L — ABNORMAL LOW (ref 20–31)
Calcium: 8.9 mg/dL (ref 8.6–10.4)
Chloride: 94 mmol/L — ABNORMAL LOW (ref 98–110)
Creat: 1.24 mg/dL — ABNORMAL HIGH (ref 0.60–0.93)
Glucose, Bld: 110 mg/dL — ABNORMAL HIGH (ref 65–99)
Potassium: 5.8 mmol/L — ABNORMAL HIGH (ref 3.5–5.3)
Sodium: 123 mmol/L — ABNORMAL LOW (ref 135–146)
Total Bilirubin: 0.3 mg/dL (ref 0.2–1.2)
Total Protein: 6.1 g/dL (ref 6.1–8.1)

## 2016-03-30 LAB — CBC WITH DIFFERENTIAL/PLATELET
Basophils Absolute: 0 cells/uL (ref 0–200)
Basophils Relative: 0 %
Eosinophils Absolute: 94 cells/uL (ref 15–500)
Eosinophils Relative: 2 %
HCT: 30.2 % — ABNORMAL LOW (ref 35.0–45.0)
Hemoglobin: 9.9 g/dL — ABNORMAL LOW (ref 11.7–15.5)
Lymphocytes Relative: 24 %
Lymphs Abs: 1128 cells/uL (ref 850–3900)
MCH: 28.9 pg (ref 27.0–33.0)
MCHC: 32.8 g/dL (ref 32.0–36.0)
MCV: 88.3 fL (ref 80.0–100.0)
MPV: 9.3 fL (ref 7.5–12.5)
Monocytes Absolute: 282 cells/uL (ref 200–950)
Monocytes Relative: 6 %
Neutro Abs: 3196 cells/uL (ref 1500–7800)
Neutrophils Relative %: 68 %
Platelets: 218 10*3/uL (ref 140–400)
RBC: 3.42 MIL/uL — ABNORMAL LOW (ref 3.80–5.10)
RDW: 16.9 % — ABNORMAL HIGH (ref 11.0–15.0)
WBC: 4.7 10*3/uL (ref 3.8–10.8)

## 2016-03-30 LAB — TSH: TSH: 4.01 mIU/L

## 2016-03-30 LAB — BRAIN NATRIURETIC PEPTIDE: Brain Natriuretic Peptide: 29.4 pg/mL (ref <100)

## 2016-03-30 LAB — MAGNESIUM: Magnesium: 1.5 mg/dL (ref 1.5–2.5)

## 2016-03-30 NOTE — Patient Instructions (Addendum)
Medication Instructions:  Your physician recommends that you continue on your current medications as directed. Please refer to the Current Medication list given to you today.    Labwork:  TODAY--CMET,  MAGNESIUM, CBC W DIFF, TSH, AND BNP    Follow-Up:  ONE WEEK WITH AN EXTENDER IN OUR OFFICE

## 2016-03-30 NOTE — Progress Notes (Signed)
Patient ID: [name], female   DOB: 1935-04-22, 81 y.o.   MRN: [id number]     Date:  03/30/2016   ID:  [name], DOB 06/01/1935, MRN [id number]  PCP:  [name], MD  Cardiologist:  Dr. Thompson Grayer => Dr. Ena Dawley    Chief complain: DOE, follow up post stress test.   History of Present Illness: [name] is a 81 y.o. female with a hx of HTN, HL, T2DM, orthostatic hypotension, prior DVT after neck surgery in 2010, s/p IVC filter, GERD and reflex sympathetic dystrophy.    She had an extensive workup for dyspnea in the past.  Please see my last office visit note from 10/29/2013 for complete details. In January 2015 she presented with chest pain shortness of breath and had abnormal stress test and underwent cardiac cath that demonstrated high-grade disease in the second diagonal which was treated with cutting balloon angioplasty.  Patient was to have GI evaluation in the near future (EGD and colonoscopy).  She had a recent history of dysphagia as well as rectal bleeding with prior history of adenomatous polyps and in situ carcinoma.  Therefore, long term commitment to dual antiplatelet Rx was to be avoided.    Echocardiogram (10/14/13): Mild LVH, mild focal basal hypertrophy of the septum, EF 60-65%, normal wall motion, grade 1 diastolic dysfunction, MAC, mild LAE, atrial septal aneurysm, PASP 34. There was a possible oscillating density on the mitral valve on parasternal long axis views (related to MAC).    03/30/2016 - the patient underwent transtibial amputation of her right lower extremity secondary to Charcot rocker-bottom deformity right foot with insensate neuropathy ulceration osteomyelitis on 02/18/2016. She has been doing really well with rehabilitation being able to walk with a walker however she developed pneumonia that slow down her rehabilitation process. She also developed ulcer on her left foot. In the last few days she has developed significant fatigue and significant  left lower extremity edema. She went to her PCP who is checking her urine for urinary tract infection. She has noticed chest pain yesterday and today that last few minutes and it worse with motion. There is no associated dizziness or shortness of breath. She sleeps on 3 pillows. And denies any paroxysmal nocturnal dyspnea that position. She denies any palpitations.  Recent Labs: 09/01/2015: ALT 10; TSH 1.308 02/18/2016: Creatinine, Ser 1.61*; Hemoglobin 10.9*; Potassium 5.2*  Wt Readings from Last 3 Encounters:  02/18/16 188 lb (85.276 kg)  01/25/16 188 lb (85.276 kg)  12/29/15 199 lb (90.266 kg)     Past Medical History  Diagnosis Date  . Hyperlipidemia     a. patient unwilling to use statins.  . Osteoporosis   . DVT (deep venous thrombosis) ([place]) 12/2007  . PE (pulmonary embolism) 12/2007    a. PE/DVT after neck surgery 2009. b. coumadin d/c 10-2008.  . Pulmonary nodule     incidental per CT:  Pet scan 4-9: likely benign, CT 08-2009 no change, no further CTs (Dr. Gwenette Greet)  . Hemorrhoids   . IBS (irritable bowel syndrome)   . Diverticulosis   . PPD positive   . Macular degeneration 03/2009    Dr. Rosana Hoes  . Allergic rhinitis   . Dyspnea     a. Chronic, extensive w/u see OV note 09-2010. b. [place] 2011: ormal with RA 5 RV 31/2 PA 27/12 (19) PCW 10 CO normal. No evidence of shunting with sitting up in cath lab. c. CPX 2011: see report. d. Prior fluoro  of diaphragm -  R diaphragm elevated at rest but both moved with inspiration.   Marland Kitchen Spinal stenosis   . GERD (gastroesophageal reflux disease)     a. Hx GERD/esophageal dysmotility followed by Dr. Olevia Perches.   Marland Kitchen Hypertension   . Neuropathy ([place])     a. Hands, feet, legs.  . Orthostatic hypotension   . CAD (coronary artery disease)     a. Nonobst in 2011. b. Abnormal nuc 09/2013 -> s/p cutting balloon to D2; mild LAD disease 10/31/13.  . Osteomyelitis ([place])     a. Adm 04/2013: Charcot collapse of the right foot with osteomyelitis and ulceration, s/p  excision.  . Venous insufficiency     a. Contributing to LEE.  . Type II diabetes mellitus ([place])   . DJD (degenerative joint disease)   . Arthritis     "back; ankles; hands; knees" (10/31/2013)  . Recurrent UTI   . 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 1994 - malignant polyp removed during colonoscopy.  . Hiatal hernia     a. s/p Nissen fundoplication [id number].  Marland Kitchen RSD (reflex sympathetic dystrophy)     a. Chronic pain.  . Abnormal echocardiogram     09/2013: mild LVH, mild focal basal hypertrophy of septum, EF 60-65%, normal WM, grade 1 diastolic dysfunction, MAC, mild LAE, ASA, PASP 34. Possible oscillating MV density - reviewed by MD and felt there was no significant abnormality other than MAC noted with mitral valve and did not require TEE.  . Adenomatous colon polyp   . Pneumonia   . Fatty liver   . PONV (postoperative nausea and vomiting) 2009    neck surgery  . Headache     Optic migraine  . Cancer [facility])     cancerous polyps  . Macular degeneration     Current Outpatient Prescriptions  Medication Sig Dispense Refill  . Alpha-Lipoic Acid 600 MG CAPS Take 600 mg by mouth 2 (two) times daily.    Marland Kitchen amLODipine (NORVASC) 2.5 MG tablet Take 1 tablet (2.5 mg total) by mouth daily. (Patient taking differently: Take 2.5 mg by mouth at bedtime. ) 180 tablet 3  . aspirin EC 81 MG tablet Take 1 tablet (81 mg total) by mouth daily. (Patient taking differently: Take 81 mg by mouth at bedtime. )    . Cholecalciferol (VITAMIN D3) 5000 UNITS CAPS Take 5,000 Units by mouth every evening. Reported on 11/24/2015    . Cinnamon 500 MG TABS Take 1 tablet by mouth 2 (two) times daily.    . Cyanocobalamin (VITAMIN B-12) 2500 MCG SUBL Place 2,500 mcg under the tongue every evening. Reported on 11/24/2015    . diclofenac sodium (VOLTAREN) 1 % GEL Apply 1 application topically daily as needed (pain).    Marland Kitchen erythromycin ophthalmic ointment Place 1 application into both eyes daily.    Marland Kitchen HYDROcodone-acetaminophen  (NORCO) 10-325 MG tablet Take 1 tablet by mouth every 6 (six) hours as needed for moderate pain or severe pain. 120 tablet 0  . lidocaine (LIDODERM) 5 % Place 1 patch onto the skin daily. Remove & Discard patch within 12 hours or as directed by MD 30 patch 0  . lisinopril (PRINIVIL,ZESTRIL) 10 MG tablet Take 1 tablet (10 mg total) by mouth daily. 90 tablet 3  . meclizine (ANTIVERT) 12.5 MG tablet Take 1 tablet (12.5 mg total) by mouth 3 (three) times daily as needed for dizziness. 90 tablet 0  . metoprolol succinate (TOPROL-XL) 25 MG 24 hr  tablet take 1 tablet by mouth once daily (Patient taking differently: take 1 tablet by mouth once daily at bedtime) 30 tablet 11  . Multiple Vitamins-Minerals (PRESERVISION AREDS 2) CAPS Take 1 capsule by mouth 2 (two) times daily.    . nabumetone (RELAFEN) 750 MG tablet Take 750 mg by mouth 2 (two) times daily.    Marland Kitchen NITROSTAT 0.4 MG SL tablet place 1 tablet under the tongue if needed every 5 minutes for chest pain 100 tablet 1  . nortriptyline (PAMELOR) 10 MG capsule Take 10 mg by mouth every evening.     Marland Kitchen omeprazole (PRILOSEC) 20 MG capsule Take 1 capsule (20 mg total) by mouth 2 (two) times daily. 180 capsule 3  . ondansetron (ZOFRAN ODT) 4 MG disintegrating tablet Take 1 tablet (4 mg total) by mouth every 8 (eight) hours as needed for nausea or vomiting. 60 tablet 12  . OVER THE COUNTER MEDICATION Take 1 capsule by mouth 2 (two) times daily. Integrative Digestive Formula    . OVER THE COUNTER MEDICATION 3 (three) times daily. Berberorine Gluco Defense    . Polyvinyl Alcohol-Povidone (REFRESH OP) Place 1 drop into both eyes daily as needed (dry eyes).    . ranitidine (ZANTAC) 150 MG tablet Take 150 mg by mouth every evening. Reported on 12/29/2015    . sulfamethoxazole-trimethoprim (BACTRIM DS,SEPTRA DS) 800-160 MG tablet Take 1 tablet by mouth 2 (two) times daily.  0  . tiZANidine (ZANAFLEX) 4 MG tablet Take 4 mg by mouth every 8 (eight) hours as needed for  muscle spasms.    Marland Kitchen trimethoprim (TRIMPEX) 100 MG tablet Take 100 mg by mouth every other day.     No current facility-administered medications for this visit.    Allergies:   Cymbalta; Gabapentin; Metformin and related; Adhesive; Cefazolin; Ciprofloxacin; Clorazepate dipotassium; Darifenacin hydrobromide; Dilaudid; Doxycycline; Enablex; Levofloxacin; Lyrica; Methadone hcl; Morphine and related; Pentazocine lactate; and [place]   Social History:  The patient  reports that she has never smoked. She has never used smokeless tobacco. She reports that she does not drink alcohol or use illicit drugs.   Family History:  The patient's family history includes Arthritis in her mother; Diabetes in her father; Diabetic kidney disease in her daughter; Headache in her sister; Heart disease in her mother; Migraines in her sister; Stroke in her father. There is no history of Colon cancer, Esophageal cancer, Stomach cancer, or Rectal cancer.   ROS:  Please see the history of present illness.   All other systems reviewed and negative.   PHYSICAL EXAM: VS:  BP 120/60 mmHg  Pulse 76  Ht 5\' 3"  (1.6 m)  The patient is in wheelchair with both legs being in horizontal position on soft pads Well nourished, well developed, in no acute distress HEENT: normal Neck: no JVD at 90 Cardiac:  normal S1, S2; RRR; no murmur Lungs:  clear to auscultation bilaterally, no wheezing, rhonchi or rales Abd: soft, nontender, no hepatomegaly Ext:  Trans-tibial amputation on the right lower extremity, 3+ pitting edema up to her knee on her left lower extremity. Skin: warm and dry Neuro:  CNs 2-12 intact, no focal abnormalities noted  EKG:  NSR, HR 74, normal axis, nonspecific ST-T wave changes  Nuclear stress test 09/14/2015 1. Nuclear stress EF: 71%. Normal LV function 2. There was no ST segment deviation noted during stress. 3. The study is normal. No ischemia. No evidence of MI . 4. This is a low risk study.  Cardiac  cath 10/31/2013  5. Normal left main coronary artery. 6. Mild mid vessel left anterior descending artery atherosclerosis. A large second diagonal has a 95% stenosis which was successfully treated with a 2.0 x 6 cutting balloon. There is an excellent Angiographic result. 7. Widely patent left circumflex artery and its branches. 8. Normal right coronary artery. 9. Normal left ventricular systolic function by echo. LVEDP 10 mmHg.  RECOMMENDATION: Continue dual antiplatelet therapy for minimum of 2 weeks, but likely a month. No stent was placed so she has not committed to Plavix long-term. She does have a GI evaluation coming up which is one reason why we did not want to commit her to Plavix.   ASSESSMENT AND PLAN:  1. Acute on chronic diastolic heart failure  - we will check labs including BMP, BNP, magnesium, CBC and TSH and start her on Lasix but possibly potassium replacement based on results. The concern is that she always had a normal creatinine, however during the last hospitalization her creatinine bumped up to 1.6 and hasn't been checked since then. Her last echocardiogram from 2014 shows ejection fraction 60-65%, we might recheck her echocardiogram in the near future if she doesn't improve. 2. Chest Pain:  The patient had negative stress test in November 2016, also upon examination it appears that her pain is more musculoskeletal and possibly related to increased workload for her upper extremities helping herself in and out of bed into a wheelchair after her amputation. Her EKG today shows normal sinus rhythm and is otherwise normal I would hold of any ischemic workup right now.  3. Acute on chronic kidney failure with creatinine increased from 0.9-1.6 during her recent hospitalization. This could have been secondary to ATN postsurgery, we will recheck creatinine today and give her Lasix dose based on results. 4. CAD: She will continue aspirin and beta blocker. she is adamant about not taking  statins. Off Plavix now.  5. Hypertension:  Controlled. 6. Hyperlipidemia - she is adamant about not taking statins. 7. Disposition:  F/u  In 1 week.    Signed, Ena Dawley, MD  03/30/2016 8:49 AM

## 2016-03-31 ENCOUNTER — Encounter: Payer: Medicare Other | Admitting: Gastroenterology

## 2016-03-31 ENCOUNTER — Telehealth: Payer: Self-pay | Admitting: *Deleted

## 2016-03-31 MED ORDER — FUROSEMIDE 40 MG PO TABS: 40.0000 mg | ORAL_TABLET | Freq: Every day | ORAL | Status: DC

## 2016-03-31 NOTE — Telephone Encounter (Signed)
-----   Message from Dorothy Spark, MD sent at 03/30/2016 11:35 PM EDT ----- Improved CRea, I would start lasix 40 mg po daily, she is mildly anemic, TSH is normal.

## 2016-03-31 NOTE — Telephone Encounter (Signed)
Notified the pts daughter [name] (on [place]), that per Dr Meda Coffee, the pts creatinine has improved, and she recommends that the pt start taking lasix 40 mg po daily.  Dr Meda Coffee noted the pt is mildly anemic, and her TSH is normal.  Confirmed the pharmacy of choice with the pts Daughter.  Daughter verbalized understanding, agrees with this plan, and will endorse this information to the pt and her Sister.

## 2016-04-05 ENCOUNTER — Ambulatory Visit: Payer: Medicare Other | Admitting: Internal Medicine

## 2016-04-06 ENCOUNTER — Encounter: Payer: Self-pay | Admitting: Nurse Practitioner

## 2016-04-06 ENCOUNTER — Telehealth: Payer: Self-pay | Admitting: Nurse Practitioner

## 2016-04-06 ENCOUNTER — Ambulatory Visit (INDEPENDENT_AMBULATORY_CARE_PROVIDER_SITE_OTHER): Payer: Medicare Other | Admitting: Nurse Practitioner

## 2016-04-06 VITALS — BP 102/68 | HR 76 | Ht 63.0 in | Wt 170.0 lb

## 2016-04-06 DIAGNOSIS — E871 Hypo-osmolality and hyponatremia: Secondary | ICD-10-CM

## 2016-04-06 DIAGNOSIS — I2510 Atherosclerotic heart disease of native coronary artery without angina pectoris: Secondary | ICD-10-CM

## 2016-04-06 DIAGNOSIS — I509 Heart failure, unspecified: Secondary | ICD-10-CM | POA: Diagnosis not present

## 2016-04-06 DIAGNOSIS — I110 Hypertensive heart disease with heart failure: Secondary | ICD-10-CM

## 2016-04-06 DIAGNOSIS — I5033 Acute on chronic diastolic (congestive) heart failure: Secondary | ICD-10-CM | POA: Diagnosis not present

## 2016-04-06 DIAGNOSIS — I5032 Chronic diastolic (congestive) heart failure: Secondary | ICD-10-CM | POA: Diagnosis not present

## 2016-04-06 DIAGNOSIS — E785 Hyperlipidemia, unspecified: Secondary | ICD-10-CM | POA: Diagnosis not present

## 2016-04-06 LAB — BASIC METABOLIC PANEL
BUN: 16 mg/dL (ref 7–25)
CO2: 22 mmol/L (ref 20–31)
Calcium: 8.9 mg/dL (ref 8.6–10.4)
Chloride: 91 mmol/L — ABNORMAL LOW (ref 98–110)
Creat: 1.01 mg/dL — ABNORMAL HIGH (ref 0.60–0.93)
Glucose, Bld: 117 mg/dL — ABNORMAL HIGH (ref 65–99)
Potassium: 5.5 mmol/L — ABNORMAL HIGH (ref 3.5–5.3)
Sodium: 120 mmol/L — CL (ref 135–146)

## 2016-04-06 MED ORDER — FUROSEMIDE 40 MG PO TABS: 40.0000 mg | ORAL_TABLET | Freq: Every day | ORAL | Status: DC | PRN

## 2016-04-06 NOTE — Patient Instructions (Signed)
Medication Instructions:  Your physician recommends that you continue on your current medications as directed. Please refer to the Current Medication list given to you today.   Labwork: TODAY:  BMET  Testing/Procedures: None ordered  Follow-Up: Your physician recommends that you schedule a follow-up appointment in: [address] [place]   Any Other Special Instructions Will Be Listed Below (If Applicable).     If you need a refill on your cardiac medications before your next appointment, please call your pharmacy.

## 2016-04-06 NOTE — Telephone Encounter (Signed)
-----   Message from Rogelia Mire, NP sent at 04/06/2016  5:18 PM EDT ----- Na very low - more so than on last evaluation.  K is also still mildly elevated.  She will need to restrict her oral fluid intake, stop lisinopril, and needs more labs tomorrow to help [place] determine why her Na is so low.  If she has any alteration in mental status, she will need to go to the ER for evaluation.  Lasix is on her list as a prn med.  She has not been taking this and she should not take this.  Please arrange for urine sodium, urine osmolality, AM cortisol level, and repeat BMET in the AM.  Please also arrange for primary care f/u for further evaluation.

## 2016-04-06 NOTE — Telephone Encounter (Signed)
Spoke with patient and daughter and reviewed results and plan of care per Ignacia Bayley, NP.  Patient reports she is not taking lasix, will d/c lisinopril, and is aware of fluid restriction.  They both verbalized understanding and agreement to go to the hospital lab @ [facility] early tomorrow for tests.  I spoke with someone in the lab who advised patient should check in at admitting.  I advised them to call the office with questions or concerns.

## 2016-04-06 NOTE — Progress Notes (Signed)
Office Visit    Patient Name: [name] Date of Encounter: 04/06/2016  Primary Care Provider:  [name], MD Primary Cardiologist:  Liane Comber, MD   Chief Complaint    81 year old female with prior history of CAD and diastolic dysfunction who was recently seen secondary to lower extremity edema and presents for follow-up.  Past Medical History    Past Medical History  Diagnosis Date  . Hyperlipidemia     a. patient unwilling to use statins.  . Osteoporosis   . DVT (deep venous thrombosis) ([place]) 12/2007  . PE (pulmonary embolism) 12/2007    a. PE/DVT after neck surgery 2009. b. coumadin d/c 10-2008.  . Pulmonary nodule     incidental per CT:  Pet scan 4-9: likely benign, CT 08-2009 no change, no further CTs (Dr. Gwenette Greet)  . Hemorrhoids   . IBS (irritable bowel syndrome)   . Diverticulosis   . PPD positive   . Macular degeneration 03/2009    Dr. Rosana Hoes  . Allergic rhinitis   . Dyspnea     a. Chronic, extensive w/u see OV note 09-2010. b. [place] 2011: ormal with RA 5 RV 31/2 PA 27/12 (19) PCW 10 CO normal. No evidence of shunting with sitting up in cath lab. c. CPX 2011: see report. d. Prior fluoro of diaphragm -  R diaphragm elevated at rest but both moved with inspiration.   Marland Kitchen Spinal stenosis   . GERD (gastroesophageal reflux disease)     a. Hx GERD/esophageal dysmotility followed by Dr. Olevia Perches.   Marland Kitchen Hypertensive heart disease   . Neuropathy ([place])     a. Hands, feet, legs.  . Orthostatic hypotension   . CAD (coronary artery disease)     a. Nonobst in 2011. b. Abnormal nuc 09/2013 -> s/p cutting balloon to D2, mild LAD disease; c. 08/2015 MV: no ischemia, EF 71%.  . Osteomyelitis ([place])     a. 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 01/2016 s/p RLE transtibial amputation 2/2 Charcot rocker-bottom deformity and insensate neuropathy ulceration.  . Venous insufficiency     a. Contributing to LEE.  . Type II diabetes  mellitus ([place])   . DJD (degenerative joint disease)   . Arthritis     "back; ankles; hands; knees" (10/31/2013)  . Recurrent UTI   . 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 1994 - malignant polyp removed during colonoscopy.  . Hiatal hernia     a. s/p Nissen fundoplication [id number].  Marland Kitchen RSD (reflex sympathetic dystrophy)     a. Chronic pain.  . Abnormal echocardiogram     09/2013: mild LVH, mild focal basal hypertrophy of septum, EF 60-65%, normal WM, grade 1 diastolic dysfunction, MAC, mild LAE, ASA, PASP 34. Possible oscillating MV density - reviewed by MD and felt there was no significant abnormality other than MAC noted with mitral valve and did not require TEE.  . Adenomatous colon polyp   . Pneumonia   . Fatty liver   . PONV (postoperative nausea and vomiting) 2009    neck surgery  . Headache     Optic migraine  . Cancer [facility])     cancerous polyps  . Macular degeneration   . Charcot's Foot     a.  01/2016 s/p RLE transtibial amputation 2/2 Charcot rocker-bottom deformity and insensate neuropathy ulceration.  . Chronic diastolic CHF (congestive heart failure) ([place])     a. 09/2013 Echo:  EF 60-65%, mild LVH, Gr1 DD.   Past Surgical History  Procedure Laterality Date  . Nissen fundoplication  [id number]  . Cholecystectomy  03/2002  . Cataract extraction w/ intraocular lens  implant, bilateral  2004    feb 2004 left, aug 2004 right  . Rotator cuff repair Right 07/2007    Dr. Percell Miller  . Anterior cervical decomp/discectomy fusion  12/18/07    For OA,  Dr. Lorin Mercy:  fu by a PE  . Toe amputation Right 08/2008    3rd toe, Dr. Sharol Given due to osteomyelitis  . Nasal septum surgery  09/1963  . Vein ligation Bilateral 03/1966  . Carpal tunnel release Left 09/2011  . Amputation  03/06/2012    Procedure: AMPUTATION FOOT;  Surgeon: Newt Minion, MD;  Location: [place];  Service: Orthopedics;  Laterality: Left;  FIFTH RAY AMPUTATION   . Vena cava filter placement  01/2010    green filter; "due to blood  clots"  . Ankle fusion  09/27/2012    Procedure: ANKLE FUSION;  Surgeon: Newt Minion, MD;  Location: [place];  Service: Orthopedics;  Laterality: Left;  Left Tibiocalcaneal Fusion  . Ankle fusion Right 05/09/2013    Procedure: ANKLE FUSION;  Surgeon: Newt Minion, MD;  Location: [place];  Service: Orthopedics;  Laterality: Right;  Excision Osteomyelitis Base 1st MT Right Foot, Fusion Medial Column  . Cardiac catheterization  05/2010     at [facility]  . Coronary angioplasty  10/31/2013  . Vaginal hysterectomy  03/1975  . Ankle surgery Left apr & june 1991  . Cyst removal hand  06/2003  . Rotator cuff repair Left 11/2004  . Wisdom tooth extraction  06/2007  . Foot bone excision Right 06/2009  . Left heart catheterization with coronary angiogram N/A 10/31/2013    Procedure: LEFT HEART CATHETERIZATION WITH CORONARY ANGIOGRAM;  Surgeon: Jettie Booze, MD;  Location[facility] CATH LAB;  Service: Cardiovascular;  Laterality: N/A;  . Percutaneous coronary intervention-balloon only  10/31/2013    Procedure: PERCUTANEOUS CORONARY INTERVENTION-BALLOON ONLY;  Surgeon: Jettie Booze, MD;  Location: [facility] CATH LAB;  Service: Cardiovascular;;  . Laparoscopic right hemi colectomy N/A 01/08/2015    Procedure: LAPAROSCOPIC ASSISTED RIGHT HEMI COLECTOMY;  Surgeon: Johnathan Hausen, MD;  Location: [facility];  Service: General;  Laterality: N/A;  . Colonoscopy      numerous times  . Amputation Right 02/18/2016    Procedure: AMPUTATION BELOW KNEE;  Surgeon: Newt Minion, MD;  Location: [place];  Service: Orthopedics;  Laterality: Right;    Allergies  Allergies  Allergen Reactions  . Cymbalta [Duloxetine Hcl] Swelling    Swelling in legs  . Gabapentin Swelling    Swelling in legs  . Metformin And Related Other (See Comments)    dizzy, tired, chills, diarrhea, and nausea  . Adhesive [Tape] Rash    rash  . Cefazolin Hives    Hives   . Ciprofloxacin Other (See Comments)    Per pt, caused body aches   . Clorazepate  Dipotassium Other (See Comments)    Unknown reaction  . Darifenacin Hydrobromide Other (See Comments)    hypotension, near syncope  . Dilaudid [Hydromorphone Hcl] Other (See Comments)     confused, intense itching  . Doxycycline Rash  . Enablex [Darifenacin Hydrobromide Er] Other (See Comments)    Hypotension, near syncope  . Levofloxacin Other (See Comments)    Causes wrist pain  . Lyrica [Pregabalin] Swelling    Swelling in legs  .  Methadone Hcl Other (See Comments)    Reaction unknown  . Morphine And Related Other (See Comments)    Confusion, constipation.   . Pentazocine Lactate Other (See Comments)    Altered mental state, "climbing walls", anxiety  . Talwin [Pentazocine] Other (See Comments)    "climbing walls" anxiety    History of Present Illness    81 year old female with the above complex past medical history including CAD, hypertension, hyperlipidemia, diabetes mellitus, orthostatic hypotension, postoperative DVT in 2010 status post IVC filter, GERD, reflex sympathetic dystrophy, diastolic dysfunction, and Charcot foot with osteomyelitis status post right lower extremity transtibial amputation in April of this year. Following surgery, she was discharged to rehabilitation. She notes that about a week prior to discharge, one of the staff members at rehabilitation began bringing in country ham biscuits for her on a daily basis. In that setting, she began to notice lower extremity edema and saw Dr. Meda Coffee one week ago. After labs showed normal renal function, she was prescribed Lasix 40 mg. Concurrently, she was diagnosed with a urinary tract infection by her primary care provider and was placed on antibiotics. By last Saturday, prior to ever taking a dose of Lasix, her grandson noted resolution of lower extremity swelling. He did give her a Lasix tablet last Saturday but has not given her any since as she has had no further swelling and did not have significant response to Lasix.  She has otherwise been feeling well. She denies chest pain or dyspnea. She is not particularly active in the setting of her recent amputation. She is not able to weigh herself at home either. She denies PND, orthopnea, dizziness, syncope, edema, or early satiety.  Home Medications    Prior to Admission medications   Medication Sig Start Date End Date Taking? Authorizing Provider  Alpha-Lipoic Acid 600 MG CAPS Take 600 mg by mouth 2 (two) times daily.   Yes Historical Provider, MD  amLODipine (NORVASC) 2.5 MG tablet Take 2.5 mg by mouth daily.   Yes Historical Provider, MD  amoxicillin (AMOXIL) 500 MG capsule Take 500 mg by mouth 3 (three) times daily. 03/31/16  Yes Historical Provider, MD  aspirin 81 MG tablet Take 81 mg by mouth daily.   Yes Historical Provider, MD  Cholecalciferol (VITAMIN D3) 5000 UNITS CAPS Take 5,000 Units by mouth every evening. Reported on 11/24/2015   Yes Historical Provider, MD  Cinnamon 500 MG TABS Take 1 tablet by mouth 2 (two) times daily.   Yes Historical Provider, MD  Cyanocobalamin (VITAMIN B-12) 2500 MCG SUBL Place 2,500 mcg under the tongue every evening. Reported on 11/24/2015   Yes Historical Provider, MD  diclofenac sodium (VOLTAREN) 1 % GEL Apply 1 application topically daily as needed (pain).   Yes Historical Provider, MD  furosemide (LASIX) 40 MG tablet Take 1 tablet (40 mg total) by mouth daily as needed for fluid or edema. 04/06/16  Yes Rogelia Mire, NP  HYDROcodone-acetaminophen (NORCO) 10-325 MG tablet Take 1 tablet by mouth every 6 (six) hours as needed for moderate pain or severe pain. 02/10/16  Yes Colon Branch, MD  lidocaine (LIDODERM) 5 % Place 1 patch onto the skin daily. Remove & Discard patch within 12 hours or as directed by MD 06/11/15  Yes Colon Branch, MD  lisinopril (PRINIVIL,ZESTRIL) 10 MG tablet Take 1 tablet (10 mg total) by mouth daily. 09/01/15  Yes Dorothy Spark, MD  meclizine (ANTIVERT) 12.5 MG tablet Take 1 tablet (12.5 mg total) by  mouth 3 (three) times daily as needed for dizziness. 01/05/16  Yes Colon Branch, MD  metoprolol succinate (TOPROL XL) 25 MG 24 hr tablet Take 25 mg by mouth daily.   Yes Historical Provider, MD  metoprolol succinate (TOPROL-XL) 25 MG 24 hr tablet take 1 tablet by mouth once daily Patient taking differently: take 1 tablet by mouth once daily at bedtime 11/24/15  Yes Dorothy Spark, MD  Multiple Vitamins-Minerals (PRESERVISION AREDS 2) CAPS Take 1 capsule by mouth 2 (two) times daily.   Yes Historical Provider, MD  nabumetone (RELAFEN) 750 MG tablet Take 750 mg by mouth 2 (two) times daily.   Yes Historical Provider, MD  nitroGLYCERIN (NITROSTAT) 0.4 MG SL tablet Place 0.4 mg under the tongue every 5 (five) minutes as needed for chest pain (x 3 doses).   Yes Historical Provider, MD  nortriptyline (PAMELOR) 10 MG capsule Take 10 mg by mouth every evening.    Yes Historical Provider, MD  omeprazole (PRILOSEC) 20 MG capsule Take 1 capsule (20 mg total) by mouth 2 (two) times daily. 10/29/15  Yes Mauri Pole, MD  ondansetron (ZOFRAN ODT) 4 MG disintegrating tablet Take 1 tablet (4 mg total) by mouth every 8 (eight) hours as needed for nausea or vomiting. 03/15/16  Yes Melvenia Beam, MD  OVER THE COUNTER MEDICATION Take 1 capsule by mouth 2 (two) times daily. Integrative Digestive Formula   Yes Historical Provider, MD  OVER THE COUNTER MEDICATION 3 (three) times daily. Berberorine Gluco Defense   Yes Historical Provider, MD  Polyvinyl Alcohol-Povidone (REFRESH OP) Place 1 drop into both eyes daily as needed (dry eyes).   Yes Historical Provider, MD  ranitidine (ZANTAC) 150 MG tablet Take 150 mg by mouth every evening. Reported on 12/29/2015   Yes Historical Provider, MD  sulfamethoxazole-trimethoprim (BACTRIM DS,SEPTRA DS) 800-160 MG tablet Take 1 tablet by mouth 2 (two) times daily. 02/08/16  Yes Historical Provider, MD  tiZANidine (ZANAFLEX) 4 MG tablet Take 4 mg by mouth every 8 (eight) hours as needed  for muscle spasms.   Yes Historical Provider, MD  trimethoprim (TRIMPEX) 100 MG tablet Take 100 mg by mouth every other day.   Yes Historical Provider, MD    Review of Systems    As above, she is been feeling well and has had no further edema.  She denies chest pain, palpitations, dyspnea, pnd, orthopnea, n, v, dizziness, syncope, or early satiety. .  All other systems reviewed and are otherwise negative except as noted above.  Physical Exam    VS:  BP 102/68 mmHg  Pulse 76  Ht 5\' 3"  (1.6 m)  Wt 170 lb (77.111 kg)  BMI 30.12 kg/m2 , BMI Body mass index is 30.12 kg/(m^2). GEN: Well nourished, well developed, in no acute distress. HEENT: normal. Neck: Supple, no JVD, carotid bruits, or masses. Cardiac: RRR, no murmurs, rubs, or gallops. No clubbing, cyanosis, edema.  Status post right lower extremity amputation. Respiratory:  Respirations regular and unlabored, clear to auscultation bilaterally. GI: Soft, nontender, nondistended, BS + x 4. MS: no deformity or atrophy. Skin: warm and dry, no rash. Neuro:  Strength and sensation are intact. Psych: Normal affect.  Accessory Clinical Findings    ECG - Regular sinus rhythm, first-degree AV block, leftward axis, no acute ST or T changes.  Assessment & Plan    1.  Chronic diastolic congestive heart failure: Patient was recently evaluated by Dr. Meda Coffee secondary to lower extremity edema in the setting of eating  country ham biscuits for about a week straight. Lasix was prescribed but patient noted improvement in edema prior to ever taking a dose. She did take 1 dose last Saturday and has not required any additional Lasix since then. She has had complete resolution of edema and denies dyspnea, PND, orthopnea, or early satiety. She is not able to weight herself at home as she is unable to balance in the setting of recent hypertension. I advised that she could likely take a half a Lasix tablet when necessary for lower extremity swelling but that if  she were taking it frequently, we would want to be notified as she may require additional lab evaluation or follow-up visits. As her potassium was elevated at 5.8 with a sodium of 123 on June 8, I will follow-up a basic metabolic panel today to assess for stability. If potassium remains elevated, I would plan to discontinue lisinopril.  2. Hypertensive heart disease: Blood pressure is stable on amlodipine, metoprolol, and lisinopril.  3. Coronary artery disease: Status post stenting of the diagonal branch in 2015 with nonischemic Myoview in 2016. She has not been having any chest pain. No indication for ischemic evaluation at this time. Continue aspirin, beta blocker, ACE inhibitor. She has previously refused statins.  4. History of acute kidney injury: Creatinine rose to 1.61 on April 28 oh stable at 1.24 on follow-up on June 8. As above, in the setting of hyperkalemia and hyponatremia on evaluation on June 8, I will repeat a basic metabolic panel today.  5. Hyperlipidemia: She has previously refused statins.  6. Disposition: Follow-up basic metabolic panel today. Follow-up with Dr. Meda Coffee in approximately 2 months or sooner if necessary.  Murray Hodgkins, NP 04/06/2016, 12:19 PM

## 2016-04-06 NOTE — Telephone Encounter (Signed)
[facility] called with critical lab of pt Sodium 120. Pt results shared with Angelica Ran, NP he stated he will address.

## 2016-04-07 ENCOUNTER — Other Ambulatory Visit (HOSPITAL_COMMUNITY)
Admission: RE | Admit: 2016-04-07 | Discharge: 2016-04-07 | Disposition: A | Discharge status: Home / Self Care | Payer: Medicare Other | Source: Ambulatory Visit | Attending: Cardiology | Admitting: Cardiology

## 2016-04-07 ENCOUNTER — Telehealth: Payer: Self-pay | Admitting: *Deleted

## 2016-04-07 DIAGNOSIS — E871 Hypo-osmolality and hyponatremia: Secondary | ICD-10-CM

## 2016-04-07 LAB — BASIC METABOLIC PANEL
Anion gap: 8 (ref 5–15)
BUN: 14 mg/dL (ref 6–20)
CO2: 22 mmol/L (ref 22–32)
Calcium: 9 mg/dL (ref 8.9–10.3)
Chloride: 92 mmol/L — ABNORMAL LOW (ref 101–111)
Creatinine, Ser: 1.14 mg/dL — ABNORMAL HIGH (ref 0.44–1.00)
GFR calc Af Amer: 52 mL/min — ABNORMAL LOW (ref >60)
GFR calc non Af Amer: 45 mL/min — ABNORMAL LOW (ref >60)
Glucose, Bld: 103 mg/dL — ABNORMAL HIGH (ref 65–99)
Potassium: 5.2 mmol/L — ABNORMAL HIGH (ref 3.5–5.1)
Sodium: 122 mmol/L — ABNORMAL LOW (ref 135–145)

## 2016-04-07 LAB — OSMOLALITY, URINE: Osmolality, Ur: 312 mOsm/kg (ref 300–900)

## 2016-04-07 LAB — SODIUM, URINE, RANDOM: Sodium, Ur: 47 mmol/L

## 2016-04-07 LAB — CORTISOL-AM, BLOOD: Cortisol - AM: 10.4 ug/dL (ref 6.7–22.6)

## 2016-04-07 NOTE — Telephone Encounter (Signed)
Kim from hospital lab called over stating patient is there to have lab work there but the lab cannot see the orders. Orders from yesterday d/c'd and new orders placed. Lab states they still cannot see orders. Handwritten orders faxed to lab at [phone] per lab request.

## 2016-04-11 ENCOUNTER — Telehealth: Payer: Self-pay | Admitting: *Deleted

## 2016-04-11 DIAGNOSIS — N183 Chronic kidney disease, stage 3 unspecified: Secondary | ICD-10-CM

## 2016-04-11 DIAGNOSIS — I5033 Acute on chronic diastolic (congestive) heart failure: Secondary | ICD-10-CM

## 2016-04-11 DIAGNOSIS — N179 Acute kidney failure, unspecified: Secondary | ICD-10-CM

## 2016-04-11 NOTE — Telephone Encounter (Signed)
Notified the pts daughter (on [place] and primary caretaker) that per Dr Meda Coffee, the pt should restrict her fluid intake to less at 867ml/day=27 ounces or 3 cups a day, and repeat a BMET in one month.  Scheduled the pts lab appt to repeat a bmet for 05/11/16.  Pts daughter verbalized understanding and agrees with this plan.

## 2016-04-11 NOTE — Telephone Encounter (Signed)
-----   Message from Dorothy Spark, MD sent at 04/11/2016  5:45 PM EDT ----- Please call her and tell her to restrict fluid intake to less 800 ml/day = 27 ounces or 3 cups a day, repeat BMP in 1 month.

## 2016-04-13 ENCOUNTER — Telehealth: Payer: Self-pay | Admitting: Internal Medicine

## 2016-04-13 NOTE — Telephone Encounter (Signed)
Relation to [id number] Call back Long:  Reason for call:  Patient requesting a refill HYDROcodone-acetaminophen (NORCO) 10-325 MG tablet. Patient has a scheduled appointment for 04/14/16.

## 2016-04-13 NOTE — Telephone Encounter (Signed)
Rx refills can be given at time of OV tomorrow.

## 2016-04-14 ENCOUNTER — Ambulatory Visit (INDEPENDENT_AMBULATORY_CARE_PROVIDER_SITE_OTHER): Payer: Medicare Other | Admitting: Internal Medicine

## 2016-04-14 ENCOUNTER — Encounter: Payer: Self-pay | Admitting: Internal Medicine

## 2016-04-14 VITALS — BP 124/64 | HR 74 | Temp 98.5°F | Ht 63.0 in

## 2016-04-14 DIAGNOSIS — E118 Type 2 diabetes mellitus with unspecified complications: Secondary | ICD-10-CM | POA: Diagnosis not present

## 2016-04-14 DIAGNOSIS — G9050 Complex regional pain syndrome I, unspecified: Secondary | ICD-10-CM | POA: Diagnosis not present

## 2016-04-14 DIAGNOSIS — I5032 Chronic diastolic (congestive) heart failure: Secondary | ICD-10-CM | POA: Diagnosis not present

## 2016-04-14 DIAGNOSIS — I2510 Atherosclerotic heart disease of native coronary artery without angina pectoris: Secondary | ICD-10-CM | POA: Diagnosis not present

## 2016-04-14 DIAGNOSIS — H8143 Vertigo of central origin, bilateral: Secondary | ICD-10-CM | POA: Diagnosis not present

## 2016-04-14 DIAGNOSIS — D649 Anemia, unspecified: Secondary | ICD-10-CM

## 2016-04-14 DIAGNOSIS — H9311 Tinnitus, right ear: Secondary | ICD-10-CM | POA: Diagnosis not present

## 2016-04-14 DIAGNOSIS — H903 Sensorineural hearing loss, bilateral: Secondary | ICD-10-CM | POA: Diagnosis not present

## 2016-04-14 LAB — IRON: Iron: 51 ug/dL (ref 45–160)

## 2016-04-14 LAB — HEMOGLOBIN: Hemoglobin: 11 g/dL — ABNORMAL LOW (ref 11.7–15.5)

## 2016-04-14 LAB — HEMOGLOBIN A1C
Hgb A1c MFr Bld: 5.8 % — ABNORMAL HIGH (ref <5.7)
Mean Plasma Glucose: 120 mg/dL

## 2016-04-14 LAB — FERRITIN: Ferritin: 85 ng/mL (ref 20–288)

## 2016-04-14 MED ORDER — HYDROCODONE-ACETAMINOPHEN 10-325 MG PO TABS: 1.0000 | ORAL_TABLET | Freq: Four times a day (QID) | ORAL | Status: DC | PRN

## 2016-04-14 NOTE — Progress Notes (Signed)
Subjective:    Patient ID: [name], female    DOB: 1935/06/01, 81 y.o.   MRN: [id number]  DOS:  04/14/2016 Type of visit - description : rov, here w/ her G-son Interval history: MSK==ince the last visit, had a below knee amputation due to osteomyelitis, recovering as expected. Sodium has been low, follow-up by cardiology. DM: Was intolerant to metformin, unable to exercise, taking a OTC supplements that includes cinnamon and chromium. CBGs when checked in the low 100s Anemia per chart review  Review of Systems  denies chest pain or difficulty breathing Recently had edema, saw  cardiology, resolved. Has vertigo, follow-up elsewhere No  abdominal pain, no blood in the stools Past Medical History  Diagnosis Date  . Hyperlipidemia     a. patient unwilling to use statins.  . Osteoporosis   . DVT (deep venous thrombosis) ([place]) 12/2007  . PE (pulmonary embolism) 12/2007    a. PE/DVT after neck surgery 2009. b. coumadin d/c 10-2008.  . Pulmonary nodule     incidental per CT:  Pet scan 4-9: likely benign, CT 08-2009 no change, no further CTs (Dr. Gwenette Greet)  . Hemorrhoids   . IBS (irritable bowel syndrome)   . Diverticulosis   . PPD positive   . Macular degeneration 03/2009    Dr. Rosana Hoes  . Allergic rhinitis   . Dyspnea     a. Chronic, extensive w/u see OV note 09-2010. b. [place] 2011: ormal with RA 5 RV 31/2 PA 27/12 (19) PCW 10 CO normal. No evidence of shunting with sitting up in cath lab. c. CPX 2011: see report. d. Prior fluoro of diaphragm -  R diaphragm elevated at rest but both moved with inspiration.   Marland Kitchen Spinal stenosis   . GERD (gastroesophageal reflux disease)     a. Hx GERD/esophageal dysmotility followed by Dr. Olevia Perches.   Marland Kitchen Hypertensive heart disease   . Neuropathy ([place])     a. Hands, feet, legs.  . Orthostatic hypotension   . CAD (coronary artery disease)     a. Nonobst in 2011. b. Abnormal nuc 09/2013 -> s/p cutting balloon to D2, mild LAD disease; c. 08/2015 MV: no  ischemia, EF 71%.  . Osteomyelitis ([place])     a. 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 01/2016 s/p RLE transtibial amputation 2/2 Charcot rocker-bottom deformity and insensate neuropathy ulceration.  . Venous insufficiency     a. Contributing to LEE.  . Type II diabetes mellitus ([place])   . DJD (degenerative joint disease)   . Arthritis     "back; ankles; hands; knees" (10/31/2013)  . Recurrent UTI   . 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 1994 - malignant polyp removed during colonoscopy.  . Hiatal hernia     a. s/p Nissen fundoplication [id number].  Marland Kitchen RSD (reflex sympathetic dystrophy)     a. Chronic pain.  . Abnormal echocardiogram     09/2013: mild LVH, mild focal basal hypertrophy of septum, EF 60-65%, normal WM, grade 1 diastolic dysfunction, MAC, mild LAE, ASA, PASP 34. Possible oscillating MV density - reviewed by MD and felt there was no significant abnormality other than MAC noted with mitral valve and did not require TEE.  . Adenomatous colon polyp   . Pneumonia   . Fatty liver   . PONV (postoperative nausea and vomiting) 2009    neck surgery  . Headache     Optic migraine  .  Cancer [facility])     cancerous polyps  . Macular degeneration   . Charcot's Foot     a.  01/2016 s/p RLE transtibial amputation 2/2 Charcot rocker-bottom deformity and insensate neuropathy ulceration.  . Chronic diastolic CHF (congestive heart failure) ([place])     a. 09/2013 Echo: EF 60-65%, mild LVH, Gr1 DD.    Past Surgical History  Procedure Laterality Date  . Nissen fundoplication  [id number]  . Cholecystectomy  03/2002  . Cataract extraction w/ intraocular lens  implant, bilateral  2004    feb 2004 left, aug 2004 right  . Rotator cuff repair Right 07/2007    Dr. Percell Miller  . Anterior cervical decomp/discectomy fusion  12/18/07    For OA,  Dr. Lorin Mercy:  fu by a PE  . Toe amputation Right 08/2008    3rd toe, Dr. Sharol Given due to osteomyelitis  . Nasal  septum surgery  09/1963  . Vein ligation Bilateral 03/1966  . Carpal tunnel release Left 09/2011  . Amputation  03/06/2012    Procedure: AMPUTATION FOOT;  Surgeon: Newt Minion, MD;  Location: [place];  Service: Orthopedics;  Laterality: Left;  FIFTH RAY AMPUTATION   . Vena cava filter placement  01/2010    green filter; "due to blood clots"  . Ankle fusion  09/27/2012    Procedure: ANKLE FUSION;  Surgeon: Newt Minion, MD;  Location: [place];  Service: Orthopedics;  Laterality: Left;  Left Tibiocalcaneal Fusion  . Ankle fusion Right 05/09/2013    Procedure: ANKLE FUSION;  Surgeon: Newt Minion, MD;  Location: [place];  Service: Orthopedics;  Laterality: Right;  Excision Osteomyelitis Base 1st MT Right Foot, Fusion Medial Column  . Cardiac catheterization  05/2010     at [facility]  . Coronary angioplasty  10/31/2013  . Vaginal hysterectomy  03/1975  . Ankle surgery Left apr & june 1991  . Cyst removal hand  06/2003  . Rotator cuff repair Left 11/2004  . Wisdom tooth extraction  06/2007  . Foot bone excision Right 06/2009  . Left heart catheterization with coronary angiogram N/A 10/31/2013    Procedure: LEFT HEART CATHETERIZATION WITH CORONARY ANGIOGRAM;  Surgeon: Jettie Booze, MD;  Location: [facility] CATH LAB;  Service: Cardiovascular;  Laterality: N/A;  . Percutaneous coronary intervention-balloon only  10/31/2013    Procedure: PERCUTANEOUS CORONARY INTERVENTION-BALLOON ONLY;  Surgeon: Jettie Booze, MD;  Location: [facility] CATH LAB;  Service: Cardiovascular;;  . Laparoscopic right hemi colectomy N/A 01/08/2015    Procedure: LAPAROSCOPIC ASSISTED RIGHT HEMI COLECTOMY;  Surgeon: Johnathan Hausen, MD;  Location: [facility];  Service: General;  Laterality: N/A;  . Colonoscopy      numerous times  . Amputation Right 02/18/2016    Procedure: AMPUTATION BELOW KNEE;  Surgeon: Newt Minion, MD;  Location: [place];  Service: Orthopedics;  Laterality: Right;    Social History   Social History  . Marital Status:  Divorced    Spouse Name: N/A  . Number of Children: 3  . Years of Education: 15   Occupational History  . Retired      from [employer]    Social History Main Topics  . Smoking status: Never Smoker   . Smokeless tobacco: Never Used     Comment: tried for a few months in college.   . Alcohol Use: No  . Drug Use: No  . Sexual Activity: No   Other Topics Concern  . Not on file   Social History Narrative  Lives: daughter lives w/ her and another daughter lives in town   1 son in [place]   Son comes home every 4 weeks to visit   Caffeine use:  Tea 1/day w/ dinner   Coffee occass                  Medication List       This list is accurate as of: 04/14/16 11:59 PM.  Always use your most recent med list.               Alpha-Lipoic Acid 600 MG Caps  Take 600 mg by mouth 2 (two) times daily.     amLODipine 2.5 MG tablet  Commonly known as:  NORVASC  Take 2.5 mg by mouth daily.     amoxicillin 500 MG capsule  Commonly known as:  AMOXIL  Take 500 mg by mouth 3 (three) times daily. Reported on 04/14/2016     aspirin 81 MG tablet  Take 81 mg by mouth daily.     Cinnamon 500 MG Tabs  Take 1 tablet by mouth 2 (two) times daily.     diclofenac sodium 1 % Gel  Commonly known as:  VOLTAREN  Apply 1 application topically daily as needed (pain).     HYDROcodone-acetaminophen 10-325 MG tablet  Commonly known as:  NORCO  Take 1 tablet by mouth every 6 (six) hours as needed for moderate pain or severe pain.     HYDROcodone-acetaminophen 10-325 MG tablet  Commonly known as:  NORCO  Take 1 tablet by mouth every 6 (six) hours as needed for moderate pain or severe pain.     lidocaine 5 %  Commonly known as:  LIDODERM  Place 1 patch onto the skin daily. Remove & Discard patch within 12 hours or as directed by MD     meclizine 12.5 MG tablet  Commonly known as:  ANTIVERT  Take 1 tablet (12.5 mg total) by mouth 3 (three) times daily as needed for dizziness.      nabumetone 750 MG tablet  Commonly known as:  RELAFEN  Take 750 mg by mouth 2 (two) times daily.     nitroGLYCERIN 0.4 MG SL tablet  Commonly known as:  NITROSTAT  Place 0.4 mg under the tongue every 5 (five) minutes as needed for chest pain (x 3 doses). Reported on 04/14/2016     nortriptyline 10 MG capsule  Commonly known as:  PAMELOR  Take 10 mg by mouth every evening.     omeprazole 20 MG capsule  Commonly known as:  PRILOSEC  Take 1 capsule (20 mg total) by mouth 2 (two) times daily.     ondansetron 4 MG disintegrating tablet  Commonly known as:  ZOFRAN ODT  Take 1 tablet (4 mg total) by mouth every 8 (eight) hours as needed for nausea or vomiting.     OVER THE COUNTER MEDICATION  3 (three) times daily. Berberorine Gluco Defense     OVER THE COUNTER MEDICATION  Take 1 capsule by mouth 2 (two) times daily. Integrative Digestive Formula     PRESERVISION AREDS 2 Caps  Take 1 capsule by mouth 2 (two) times daily.     ranitidine 150 MG tablet  Commonly known as:  ZANTAC  Take 150 mg by mouth every evening. Reported on 04/14/2016     REFRESH OP  Place 1 drop into both eyes daily as needed (dry eyes).     sulfamethoxazole-trimethoprim 800-160 MG tablet  Commonly known as:  BACTRIM DS,SEPTRA DS  Take 1 tablet by mouth 2 (two) times daily.     tiZANidine 4 MG tablet  Commonly known as:  ZANAFLEX  Take 4 mg by mouth every 8 (eight) hours as needed for muscle spasms.     TOPROL XL 25 MG 24 hr tablet  Generic drug:  metoprolol succinate  Take 25 mg by mouth daily.     trimethoprim 100 MG tablet  Commonly known as:  TRIMPEX  Take 100 mg by mouth every other day.     Vitamin B-12 2500 MCG Subl  Place 2,500 mcg under the tongue every evening. Reported on 11/24/2015     Vitamin D3 5000 units Caps  Take 5,000 Units by mouth every evening. Reported on 11/24/2015           Objective:   Physical Exam BP 124/64 mmHg  Pulse 74  Temp(Src) 98.5 F (36.9 C) (Oral)  Ht 5\' 3"   (1.6 m)  Wt   SpO2 97% General:   Well developed, well nourished . NAD.  HEENT:  Normocephalic . Face symmetric, atraumatic Lungs:  CTA B Normal respiratory effort, no intercostal retractions, no accessory muscle use. Heart: RRR,  no murmur.  No  edema  L leg  Skin: Not pale. Not jaundice Neurologic:  alert & oriented X3.  Speech normal  Psych--  Cognition and judgment appear intact.  Cooperative with normal attention span and concentration.  Behavior appropriate. No anxious or depressed appearing.      Assessment & Plan:   Assessment DM - metformin intolerant  (lethargic) HTN  Hyperlipidemia-- won't take statins  CV: ---CAD,  ---Chronic diastolic CHF GI: GERD, IBS, colon polyps, PUD, abdominal pain "post polypectomy syndrome" Osteoporosis  MSK,pain mngmt : --Reflex sympathetic dystrophy --Neuropathy --DJD --Spinal stenosis  --H/o Osteomyelitis  Foot --s/p amputation:  R - L toes , R BKA  (01-2016, dx osteomyelitis) -- sees Dr Sharol Given prn Takes nortriptyline also for leg cramps. She had local injections with Dr. Maia Petties 2016 w/o apparent help. She took oxycodone after surgery and that did NOT seem to help better than hydrocodone. Intolerant to Cymbalta, Neurontin ; Lyrica helped but caused edema. Recurrent UTIs -- bactrim q d Venous insufficiency  H/o  + PPD H/o hypothyroidism: TSHs stable H/o dyspnea  PLAN: DM: Intolerant to metformin, unable to exercise, taking a OTC supplements. Check A1c. CHF: had edema, resolved; now has hyponatremia follow-up by cardiology, etiology unclear, not on diuretics. Cardiology plans to check a BMP in few weeks. Status post BKA, recuperating well. RSD Refill hydrocodone. Anemia per chart review, check a hemoglobin, iron and ferritin. She is taking vitamin supplements. RTC 3-4 months, CPX

## 2016-04-14 NOTE — Progress Notes (Signed)
Pre visit review using our clinic review tool, if applicable. No additional management support is needed unless otherwise documented below in the visit note. 

## 2016-04-14 NOTE — Patient Instructions (Signed)
GO TO THE LAB : Get the blood work     GO TO THE FRONT DESK Schedule your next appointment for a  Yearly check up in 3-4 months

## 2016-04-16 NOTE — Assessment & Plan Note (Signed)
DM: Intolerant to metformin, unable to exercise, taking a OTC supplements. Check A1c. CHF: had edema, resolved; now has hyponatremia follow-up by cardiology, etiology unclear, not on diuretics. Cardiology plans to check a BMP in few weeks. Status post BKA, recuperating well. RSD Refill hydrocodone. Anemia per chart review, check a hemoglobin, iron and ferritin. She is taking vitamin supplements. RTC 3-4 months, CPX

## 2016-04-19 DIAGNOSIS — E1142 Type 2 diabetes mellitus with diabetic polyneuropathy: Secondary | ICD-10-CM | POA: Diagnosis not present

## 2016-04-19 DIAGNOSIS — I872 Venous insufficiency (chronic) (peripheral): Secondary | ICD-10-CM | POA: Diagnosis not present

## 2016-04-19 DIAGNOSIS — Z4781 Encounter for orthopedic aftercare following surgical amputation: Secondary | ICD-10-CM | POA: Diagnosis not present

## 2016-04-19 DIAGNOSIS — I2510 Atherosclerotic heart disease of native coronary artery without angina pectoris: Secondary | ICD-10-CM | POA: Diagnosis not present

## 2016-04-19 DIAGNOSIS — I10 Essential (primary) hypertension: Secondary | ICD-10-CM | POA: Diagnosis not present

## 2016-04-19 DIAGNOSIS — Z89611 Acquired absence of right leg above knee: Secondary | ICD-10-CM | POA: Diagnosis not present

## 2016-04-27 DIAGNOSIS — Z89611 Acquired absence of right leg above knee: Secondary | ICD-10-CM | POA: Diagnosis not present

## 2016-04-27 DIAGNOSIS — I872 Venous insufficiency (chronic) (peripheral): Secondary | ICD-10-CM | POA: Diagnosis not present

## 2016-04-27 DIAGNOSIS — I10 Essential (primary) hypertension: Secondary | ICD-10-CM | POA: Diagnosis not present

## 2016-04-27 DIAGNOSIS — I2510 Atherosclerotic heart disease of native coronary artery without angina pectoris: Secondary | ICD-10-CM | POA: Diagnosis not present

## 2016-04-27 DIAGNOSIS — Z4781 Encounter for orthopedic aftercare following surgical amputation: Secondary | ICD-10-CM | POA: Diagnosis not present

## 2016-04-27 DIAGNOSIS — E1142 Type 2 diabetes mellitus with diabetic polyneuropathy: Secondary | ICD-10-CM | POA: Diagnosis not present

## 2016-04-28 DIAGNOSIS — Z89611 Acquired absence of right leg above knee: Secondary | ICD-10-CM | POA: Diagnosis not present

## 2016-04-28 DIAGNOSIS — E1142 Type 2 diabetes mellitus with diabetic polyneuropathy: Secondary | ICD-10-CM | POA: Diagnosis not present

## 2016-04-28 DIAGNOSIS — Z4781 Encounter for orthopedic aftercare following surgical amputation: Secondary | ICD-10-CM | POA: Diagnosis not present

## 2016-04-28 DIAGNOSIS — I2510 Atherosclerotic heart disease of native coronary artery without angina pectoris: Secondary | ICD-10-CM | POA: Diagnosis not present

## 2016-04-28 DIAGNOSIS — I872 Venous insufficiency (chronic) (peripheral): Secondary | ICD-10-CM | POA: Diagnosis not present

## 2016-04-28 DIAGNOSIS — I10 Essential (primary) hypertension: Secondary | ICD-10-CM | POA: Diagnosis not present

## 2016-05-01 DIAGNOSIS — Z4781 Encounter for orthopedic aftercare following surgical amputation: Secondary | ICD-10-CM | POA: Diagnosis not present

## 2016-05-01 DIAGNOSIS — E1142 Type 2 diabetes mellitus with diabetic polyneuropathy: Secondary | ICD-10-CM | POA: Diagnosis not present

## 2016-05-01 DIAGNOSIS — I2510 Atherosclerotic heart disease of native coronary artery without angina pectoris: Secondary | ICD-10-CM | POA: Diagnosis not present

## 2016-05-01 DIAGNOSIS — I10 Essential (primary) hypertension: Secondary | ICD-10-CM | POA: Diagnosis not present

## 2016-05-01 DIAGNOSIS — I872 Venous insufficiency (chronic) (peripheral): Secondary | ICD-10-CM | POA: Diagnosis not present

## 2016-05-01 DIAGNOSIS — Z89611 Acquired absence of right leg above knee: Secondary | ICD-10-CM | POA: Diagnosis not present

## 2016-05-02 DIAGNOSIS — E1142 Type 2 diabetes mellitus with diabetic polyneuropathy: Secondary | ICD-10-CM | POA: Diagnosis not present

## 2016-05-02 DIAGNOSIS — I2510 Atherosclerotic heart disease of native coronary artery without angina pectoris: Secondary | ICD-10-CM | POA: Diagnosis not present

## 2016-05-02 DIAGNOSIS — Z4781 Encounter for orthopedic aftercare following surgical amputation: Secondary | ICD-10-CM | POA: Diagnosis not present

## 2016-05-02 DIAGNOSIS — I10 Essential (primary) hypertension: Secondary | ICD-10-CM | POA: Diagnosis not present

## 2016-05-02 DIAGNOSIS — Z89611 Acquired absence of right leg above knee: Secondary | ICD-10-CM | POA: Diagnosis not present

## 2016-05-02 DIAGNOSIS — I872 Venous insufficiency (chronic) (peripheral): Secondary | ICD-10-CM | POA: Diagnosis not present

## 2016-05-03 DIAGNOSIS — I2510 Atherosclerotic heart disease of native coronary artery without angina pectoris: Secondary | ICD-10-CM | POA: Diagnosis not present

## 2016-05-03 DIAGNOSIS — I10 Essential (primary) hypertension: Secondary | ICD-10-CM | POA: Diagnosis not present

## 2016-05-03 DIAGNOSIS — I872 Venous insufficiency (chronic) (peripheral): Secondary | ICD-10-CM | POA: Diagnosis not present

## 2016-05-03 DIAGNOSIS — Z89611 Acquired absence of right leg above knee: Secondary | ICD-10-CM | POA: Diagnosis not present

## 2016-05-03 DIAGNOSIS — Z4781 Encounter for orthopedic aftercare following surgical amputation: Secondary | ICD-10-CM | POA: Diagnosis not present

## 2016-05-03 DIAGNOSIS — E1142 Type 2 diabetes mellitus with diabetic polyneuropathy: Secondary | ICD-10-CM | POA: Diagnosis not present

## 2016-05-04 DIAGNOSIS — I2510 Atherosclerotic heart disease of native coronary artery without angina pectoris: Secondary | ICD-10-CM | POA: Diagnosis not present

## 2016-05-04 DIAGNOSIS — Z4781 Encounter for orthopedic aftercare following surgical amputation: Secondary | ICD-10-CM | POA: Diagnosis not present

## 2016-05-04 DIAGNOSIS — Z89611 Acquired absence of right leg above knee: Secondary | ICD-10-CM | POA: Diagnosis not present

## 2016-05-04 DIAGNOSIS — I10 Essential (primary) hypertension: Secondary | ICD-10-CM | POA: Diagnosis not present

## 2016-05-04 DIAGNOSIS — I872 Venous insufficiency (chronic) (peripheral): Secondary | ICD-10-CM | POA: Diagnosis not present

## 2016-05-04 DIAGNOSIS — E1142 Type 2 diabetes mellitus with diabetic polyneuropathy: Secondary | ICD-10-CM | POA: Diagnosis not present

## 2016-05-08 DIAGNOSIS — I872 Venous insufficiency (chronic) (peripheral): Secondary | ICD-10-CM | POA: Diagnosis not present

## 2016-05-08 DIAGNOSIS — E1142 Type 2 diabetes mellitus with diabetic polyneuropathy: Secondary | ICD-10-CM | POA: Diagnosis not present

## 2016-05-08 DIAGNOSIS — I10 Essential (primary) hypertension: Secondary | ICD-10-CM | POA: Diagnosis not present

## 2016-05-08 DIAGNOSIS — Z4781 Encounter for orthopedic aftercare following surgical amputation: Secondary | ICD-10-CM | POA: Diagnosis not present

## 2016-05-08 DIAGNOSIS — I2510 Atherosclerotic heart disease of native coronary artery without angina pectoris: Secondary | ICD-10-CM | POA: Diagnosis not present

## 2016-05-08 DIAGNOSIS — Z89611 Acquired absence of right leg above knee: Secondary | ICD-10-CM | POA: Diagnosis not present

## 2016-05-10 DIAGNOSIS — I872 Venous insufficiency (chronic) (peripheral): Secondary | ICD-10-CM | POA: Diagnosis not present

## 2016-05-10 DIAGNOSIS — I2510 Atherosclerotic heart disease of native coronary artery without angina pectoris: Secondary | ICD-10-CM | POA: Diagnosis not present

## 2016-05-10 DIAGNOSIS — Z89611 Acquired absence of right leg above knee: Secondary | ICD-10-CM | POA: Diagnosis not present

## 2016-05-10 DIAGNOSIS — I10 Essential (primary) hypertension: Secondary | ICD-10-CM | POA: Diagnosis not present

## 2016-05-10 DIAGNOSIS — Z4781 Encounter for orthopedic aftercare following surgical amputation: Secondary | ICD-10-CM | POA: Diagnosis not present

## 2016-05-10 DIAGNOSIS — E1142 Type 2 diabetes mellitus with diabetic polyneuropathy: Secondary | ICD-10-CM | POA: Diagnosis not present

## 2016-05-11 ENCOUNTER — Other Ambulatory Visit (INDEPENDENT_AMBULATORY_CARE_PROVIDER_SITE_OTHER): Payer: Medicare Other | Admitting: *Deleted

## 2016-05-11 DIAGNOSIS — N183 Chronic kidney disease, stage 3 unspecified: Secondary | ICD-10-CM

## 2016-05-11 DIAGNOSIS — N179 Acute kidney failure, unspecified: Secondary | ICD-10-CM

## 2016-05-11 DIAGNOSIS — I5033 Acute on chronic diastolic (congestive) heart failure: Secondary | ICD-10-CM | POA: Diagnosis not present

## 2016-05-11 LAB — BASIC METABOLIC PANEL
BUN: 17 mg/dL (ref 7–25)
CO2: 24 mmol/L (ref 20–31)
Calcium: 8.6 mg/dL (ref 8.6–10.4)
Chloride: 105 mmol/L (ref 98–110)
Creat: 0.93 mg/dL (ref 0.60–0.93)
Glucose, Bld: 99 mg/dL (ref 65–99)
Potassium: 4.5 mmol/L (ref 3.5–5.3)
Sodium: 138 mmol/L (ref 135–146)

## 2016-05-11 NOTE — Addendum Note (Signed)
Addended by: Eulis Foster on: 05/11/2016 08:38 AM   Modules accepted: Orders

## 2016-05-12 ENCOUNTER — Telehealth: Payer: Self-pay | Admitting: Cardiology

## 2016-05-12 NOTE — Telephone Encounter (Signed)
New Message  Pt daughter called requesting to speak with RN. Pt daughter states pt was put on a a restricted liquid diet. Pt daughter ask if pt still needs to follow diet. Please call back to discuss

## 2016-05-12 NOTE — Telephone Encounter (Signed)
Advised ok to return to normal diet that she was on before fluid restrictions were advised, per  Dr. Meda Coffee. She verbalized understanding.

## 2016-05-17 DIAGNOSIS — E1142 Type 2 diabetes mellitus with diabetic polyneuropathy: Secondary | ICD-10-CM | POA: Diagnosis not present

## 2016-05-17 DIAGNOSIS — I872 Venous insufficiency (chronic) (peripheral): Secondary | ICD-10-CM | POA: Diagnosis not present

## 2016-05-17 DIAGNOSIS — I2510 Atherosclerotic heart disease of native coronary artery without angina pectoris: Secondary | ICD-10-CM | POA: Diagnosis not present

## 2016-05-17 DIAGNOSIS — I10 Essential (primary) hypertension: Secondary | ICD-10-CM | POA: Diagnosis not present

## 2016-05-17 DIAGNOSIS — Z4781 Encounter for orthopedic aftercare following surgical amputation: Secondary | ICD-10-CM | POA: Diagnosis not present

## 2016-05-17 DIAGNOSIS — Z89611 Acquired absence of right leg above knee: Secondary | ICD-10-CM | POA: Diagnosis not present

## 2016-05-19 DIAGNOSIS — I872 Venous insufficiency (chronic) (peripheral): Secondary | ICD-10-CM | POA: Diagnosis not present

## 2016-05-19 DIAGNOSIS — I2510 Atherosclerotic heart disease of native coronary artery without angina pectoris: Secondary | ICD-10-CM | POA: Diagnosis not present

## 2016-05-19 DIAGNOSIS — Z4781 Encounter for orthopedic aftercare following surgical amputation: Secondary | ICD-10-CM | POA: Diagnosis not present

## 2016-05-19 DIAGNOSIS — I10 Essential (primary) hypertension: Secondary | ICD-10-CM | POA: Diagnosis not present

## 2016-05-19 DIAGNOSIS — Z89611 Acquired absence of right leg above knee: Secondary | ICD-10-CM | POA: Diagnosis not present

## 2016-05-19 DIAGNOSIS — E1142 Type 2 diabetes mellitus with diabetic polyneuropathy: Secondary | ICD-10-CM | POA: Diagnosis not present

## 2016-05-22 ENCOUNTER — Telehealth: Payer: Self-pay | Admitting: Internal Medicine

## 2016-05-22 MED ORDER — MECLIZINE HCL 12.5 MG PO TABS: 12.5000 mg | ORAL_TABLET | Freq: Three times a day (TID) | ORAL | 0 refills | Status: DC | PRN

## 2016-05-22 NOTE — Telephone Encounter (Signed)
Pt is requesting a refill on her meclizine. Pt is currently out of town.   Pharmacy: [place], [place]   Phone [phone]     Pt's son # to reach her at if need be: [phone]

## 2016-05-22 NOTE — Telephone Encounter (Signed)
Rx sent 

## 2016-05-23 DIAGNOSIS — I872 Venous insufficiency (chronic) (peripheral): Secondary | ICD-10-CM | POA: Diagnosis not present

## 2016-05-23 DIAGNOSIS — Z4781 Encounter for orthopedic aftercare following surgical amputation: Secondary | ICD-10-CM | POA: Diagnosis not present

## 2016-05-23 DIAGNOSIS — I10 Essential (primary) hypertension: Secondary | ICD-10-CM | POA: Diagnosis not present

## 2016-05-23 DIAGNOSIS — I2510 Atherosclerotic heart disease of native coronary artery without angina pectoris: Secondary | ICD-10-CM | POA: Diagnosis not present

## 2016-05-23 DIAGNOSIS — Z89611 Acquired absence of right leg above knee: Secondary | ICD-10-CM | POA: Diagnosis not present

## 2016-05-23 DIAGNOSIS — E1142 Type 2 diabetes mellitus with diabetic polyneuropathy: Secondary | ICD-10-CM | POA: Diagnosis not present

## 2016-05-25 DIAGNOSIS — Z4781 Encounter for orthopedic aftercare following surgical amputation: Secondary | ICD-10-CM | POA: Diagnosis not present

## 2016-05-25 DIAGNOSIS — Z89611 Acquired absence of right leg above knee: Secondary | ICD-10-CM | POA: Diagnosis not present

## 2016-05-25 DIAGNOSIS — I2510 Atherosclerotic heart disease of native coronary artery without angina pectoris: Secondary | ICD-10-CM | POA: Diagnosis not present

## 2016-05-25 DIAGNOSIS — I872 Venous insufficiency (chronic) (peripheral): Secondary | ICD-10-CM | POA: Diagnosis not present

## 2016-05-25 DIAGNOSIS — I10 Essential (primary) hypertension: Secondary | ICD-10-CM | POA: Diagnosis not present

## 2016-05-25 DIAGNOSIS — E1142 Type 2 diabetes mellitus with diabetic polyneuropathy: Secondary | ICD-10-CM | POA: Diagnosis not present

## 2016-05-29 ENCOUNTER — Telehealth: Payer: Self-pay | Admitting: Neurology

## 2016-05-29 DIAGNOSIS — R110 Nausea: Secondary | ICD-10-CM

## 2016-05-29 MED ORDER — ONDANSETRON 4 MG PO TBDP: 4.0000 mg | ORAL_TABLET | Freq: Three times a day (TID) | ORAL | 11 refills | Status: DC | PRN

## 2016-05-29 NOTE — Telephone Encounter (Signed)
Called p daughter, [name] back. She stated they no longer have any refills d/t her not being at facility anymore. They would like new rx sent. Advised Dr Jaynee Eagles was ok with this. I verified pharmacy.  I verified that she had f/u with MM, NP on 8/24. She verbalized understanding.

## 2016-05-29 NOTE — Telephone Encounter (Signed)
Patient's daughter [name] is calling and states her mother needs a new Rx ondansetron(ZOFRAN) 4 mg distintegrating tablets sent to her pharmacy [facility] [place]   Thanks!

## 2016-05-29 NOTE — Telephone Encounter (Signed)
Faxed signed rx by AA, MD to requested pharmacy. Received confirmation.

## 2016-06-05 ENCOUNTER — Telehealth: Payer: Self-pay | Admitting: Internal Medicine

## 2016-06-05 MED ORDER — HYDROCODONE-ACETAMINOPHEN 7.5-300 MG PO TABS: 1.0000 | ORAL_TABLET | Freq: Four times a day (QID) | ORAL | 0 refills | Status: DC

## 2016-06-05 NOTE — Telephone Encounter (Signed)
Pt is requesting refill on Hydrocodone.  Last OV: 04/14/2016 Last Fill: 04/14/2016 #120 and 0RF For June and July 2017 UDS: 08/11/2015 Low risk  Please advise.

## 2016-06-05 NOTE — Telephone Encounter (Signed)
Advise patient, will slt  decrease the dose of hydrocodone to 7.5. See new prescription, #120 No refills

## 2016-06-05 NOTE — Telephone Encounter (Signed)
Spoke w/ Pt's daughter (on [place]), informed her that Rx has been lowered to 7.5-300 due to new regulations starting in 10/2016 that PCP is readying all Pt's for. Pt's daughter verbalized understanding.

## 2016-06-05 NOTE — Telephone Encounter (Signed)
°  Relationship to patient: Self   Can be reached: [phone]  Reason for call: Refill request for HYDROcodone

## 2016-06-08 DIAGNOSIS — E11610 Type 2 diabetes mellitus with diabetic neuropathic arthropathy: Secondary | ICD-10-CM | POA: Diagnosis not present

## 2016-06-08 DIAGNOSIS — M7581 Other shoulder lesions, right shoulder: Secondary | ICD-10-CM | POA: Diagnosis not present

## 2016-06-08 DIAGNOSIS — M1712 Unilateral primary osteoarthritis, left knee: Secondary | ICD-10-CM | POA: Diagnosis not present

## 2016-06-08 DIAGNOSIS — E1142 Type 2 diabetes mellitus with diabetic polyneuropathy: Secondary | ICD-10-CM | POA: Diagnosis not present

## 2016-06-08 DIAGNOSIS — M7711 Lateral epicondylitis, right elbow: Secondary | ICD-10-CM | POA: Diagnosis not present

## 2016-06-14 ENCOUNTER — Encounter: Payer: Self-pay | Admitting: *Deleted

## 2016-06-15 ENCOUNTER — Ambulatory Visit: Payer: Medicare Other | Admitting: Adult Health

## 2016-06-19 ENCOUNTER — Telehealth: Payer: Self-pay | Admitting: Internal Medicine

## 2016-06-19 NOTE — Telephone Encounter (Signed)
Please advise 

## 2016-06-19 NOTE — Telephone Encounter (Signed)
Pharmacy:  [facility] [address] [facility] [place], [place] [phone] (Phone) [phone] (Fax)     Reason for call: Pharmacy called stating that there is no such dosage as Hydrocodone-Acetaminophen 7.5-300 MG TABS [id number] could not fill Rx

## 2016-06-20 NOTE — Telephone Encounter (Signed)
Change to hydrocodone-acetaminophen 7.5/325, same sig

## 2016-06-20 NOTE — Telephone Encounter (Signed)
Called and spoke w/ Juliann Pulse, pharmacist at [facility], she informed me that she was work all day yesterday and unsure who called (no notes in system).

## 2016-06-28 ENCOUNTER — Encounter: Payer: Self-pay | Admitting: Adult Health

## 2016-06-28 ENCOUNTER — Ambulatory Visit (INDEPENDENT_AMBULATORY_CARE_PROVIDER_SITE_OTHER): Payer: Medicare Other | Admitting: Cardiology

## 2016-06-28 ENCOUNTER — Encounter: Payer: Self-pay | Admitting: Cardiology

## 2016-06-28 ENCOUNTER — Ambulatory Visit (INDEPENDENT_AMBULATORY_CARE_PROVIDER_SITE_OTHER): Payer: Medicare Other | Admitting: Adult Health

## 2016-06-28 VITALS — BP 120/74 | HR 72 | Ht 63.0 in

## 2016-06-28 VITALS — BP 124/62 | HR 73 | Ht 63.0 in | Wt 170.0 lb

## 2016-06-28 DIAGNOSIS — E871 Hypo-osmolality and hyponatremia: Secondary | ICD-10-CM

## 2016-06-28 DIAGNOSIS — I2510 Atherosclerotic heart disease of native coronary artery without angina pectoris: Secondary | ICD-10-CM

## 2016-06-28 DIAGNOSIS — R110 Nausea: Secondary | ICD-10-CM

## 2016-06-28 DIAGNOSIS — I110 Hypertensive heart disease with heart failure: Secondary | ICD-10-CM

## 2016-06-28 DIAGNOSIS — I5033 Acute on chronic diastolic (congestive) heart failure: Secondary | ICD-10-CM | POA: Diagnosis not present

## 2016-06-28 DIAGNOSIS — E785 Hyperlipidemia, unspecified: Secondary | ICD-10-CM

## 2016-06-28 DIAGNOSIS — R42 Dizziness and giddiness: Secondary | ICD-10-CM | POA: Diagnosis not present

## 2016-06-28 NOTE — Patient Instructions (Signed)
Medication Instructions:   Your physician recommends that you continue on your current medications as directed. Please refer to the Current Medication list given to you today.    Labwork:  PRIOR TOO YOUR 3 MONTH FOLLOW-UP APPOINTMENT WITH DR NELSON TO CHECK---CMET, TSH, AND CBC W DIFF    Follow-Up:  3 MONTHS WITH DR NELSON--PLEASE HAVE YOUR LABS DONE A WEEK OR SO,PRIOR TO THIS OFFICE VISIT.       If you need a refill on your cardiac medications before your next appointment, please call your pharmacy.

## 2016-06-28 NOTE — Progress Notes (Addendum)
PATIENT: [name] DOB: Jun 01, 1935  REASON FOR VISIT: follow up- dizziness HISTORY FROM: patient  HISTORY OF PRESENT ILLNESS: [name] is a 81 year old female with a history of dizziness. She returns today for follow-up. She states that her dizziness has improved. She did have an appointment with Dr. Claria Dice and reports that there exam was unremarkable. She states that she takes the Zofran and meclizine before getting in the car. She states that she was able to come from [place] last night without having an episode of dizziness. She states approximately 2 weeks ago she felt as if the dizziness will start but then it resolved. The patient did have a below the knee amputation on the right in April. She is in the process of getting a prosthesis and learning to walk. She is currently residing in [place] with her son until she can ambulate independently. She returns today for an evaluation.   03/15/16 (ahern): She is having episodes of getting very sick. She starts having motion sickness. Lots of nausea. She has vertigo. Feels like her head is spinning. Worse when she moves her head. She gets the vertigo when she moves her head. She feels like she has to sit very still and not move her head or the vertigo will come back. This is not something new. It has become more severe. These episodes have been going on for at least a year and she also had in the past. It happens in the afternoons. Sometimes after she eats. She has decreased hearing. Has not seen anyone fore   HPI: BRITANEY ESPAILLAT is a 81 y.o. female here as a referral from Dr. Larose Kells for headaches., PMHx spinal stenosis, HTN, neuropathy, CAD, DM2, arthritis, RSD, fatty liver. Patient fell on her steps while helping with groceries and she was flat on her back. She uses a walker and wasn't using it, helping lift groceries. Her head hit the bricks. She did not go to the hospital. This happened mid December. She followed up on her back  and neck and all was good. She hasn't felt right since then, a few weeks ago she was in the bathroom and all of a sudden she saw lights and stars blinking on the right side, went on for 30 minutes. It has happened a few times, happened less since them. She saw Dr. Bing Plume and they could not see her so she went to Dr. Katy Fitch. She has a history of migraines when she was a teenager and lasted into her thirties. Sister with very bad migraines. After the lights stop she gets a headache. No double vision, no blurry vision, but she does see visual abnormalities like something running across her vision, she is having more headaches. She is also having dizziness. She has been more irritable since hitting her head, worsening memory problems. She is having blurry vision. She takes hydrocodone daily. She takes advil sometimes not too often. They don't last that long, not that severe 5/10 for 20 minutes.   Reviewed notes, labs and imaging from outside physicians, which showed; She was seen by Dr Katy Fitch for an emergency visit. Her head had been hurting since a fall. She had a "light show" in her right eye with some vision changes. VA was od 20/30, os 20/25, external exam normal, slit lamp nml, fundus nml, glaucoma borderline. He suspected migraine aura. Visual fields were performed. .  crp 4.2, esr 2.   REVIEW OF SYSTEMS: Out of a complete 14 system review  of symptoms, the patient complains only of the following symptoms, and all other reviewed systems are negative.  Hearing loss, daytime sleepiness, memory loss, headache  ALLERGIES: Allergies  Allergen Reactions  . Cymbalta [Duloxetine Hcl] Swelling    Swelling in legs  . Duloxetine Swelling    Swelling in legs  . Gabapentin Swelling    Swelling in legs Swelling in legs  . Silicone Hives, Itching, Dermatitis and Rash  . Metformin And Related Other (See Comments)    dizzy, tired, chills, diarrhea, and nausea  . Adhesive [Tape] Rash    rash rash  .  Buprenorphine Hcl     Other reaction(s): Other (See Comments) Confusion, constipation.  . Cefazolin Hives    Hives Hives   . Ciprofloxacin Other (See Comments)    Other reaction(s): Other (See Comments) Per pt, caused body aches Per pt, caused body aches   . Clorazepate Dipotassium Other (See Comments)    Unknown reaction  . Clorazepate Dipotassium     Other reaction(s): Other (See Comments) Unknown reaction  . Darifenacin     Other reaction(s): Other (See Comments) hypotension, near syncope Other reaction(s): Other (See Comments) Hypotension, near syncope  . Darifenacin Hydrobromide Other (See Comments)    hypotension, near syncope  . Dilaudid [Hydromorphone Hcl] Other (See Comments)     confused, intense itching  . Doxycycline Rash  . Enablex [Darifenacin Hydrobromide Er] Other (See Comments)    Hypotension, near syncope  . Levofloxacin Other (See Comments)    Causes wrist pain  . Lyrica [Pregabalin] Swelling    Swelling in legs  . Methadone Hcl Other (See Comments)    Reaction unknown  . Morphine And Related Other (See Comments)    Confusion, constipation.   . Pentazocine Lactate Other (See Comments)    Altered mental state, "climbing walls", anxiety  . Talwin [Pentazocine] Other (See Comments)    "climbing walls" anxiety    HOME MEDICATIONS: Outpatient Medications Prior to Visit  Medication Sig Dispense Refill  . Alpha-Lipoic Acid 600 MG CAPS Take 600 mg by mouth 2 (two) times daily.    Marland Kitchen amLODipine (NORVASC) 2.5 MG tablet Take 2.5 mg by mouth daily.    Marland Kitchen amoxicillin (AMOXIL) 500 MG capsule Take 500 mg by mouth 3 (three) times daily. Reported on 04/14/2016  0  . aspirin 81 MG tablet Take 81 mg by mouth daily.    . Cholecalciferol (VITAMIN D3) 5000 UNITS CAPS Take 5,000 Units by mouth every evening. Reported on 11/24/2015    . Cinnamon 500 MG TABS Take 1 tablet by mouth 2 (two) times daily.    . Cyanocobalamin (VITAMIN B-12) 2500 MCG SUBL Place 2,500 mcg under the  tongue every evening. Reported on 11/24/2015    . diclofenac sodium (VOLTAREN) 1 % GEL Apply 1 application topically daily as needed (pain).    . Hydrocodone-Acetaminophen 7.5-300 MG TABS Take 1 tablet by mouth 4 (four) times daily. 120 each 0  . lidocaine (LIDODERM) 5 % Place 1 patch onto the skin daily. Remove & Discard patch within 12 hours or as directed by MD 30 patch 0  . meclizine (ANTIVERT) 12.5 MG tablet Take 1 tablet (12.5 mg total) by mouth 3 (three) times daily as needed for dizziness. 90 tablet 0  . metoprolol succinate (TOPROL XL) 25 MG 24 hr tablet Take 25 mg by mouth daily.    . Multiple Vitamins-Minerals (PRESERVISION AREDS 2) CAPS Take 1 capsule by mouth 2 (two) times daily.    . nabumetone (  RELAFEN) 750 MG tablet Take 750 mg by mouth 2 (two) times daily.    . nitroGLYCERIN (NITROSTAT) 0.4 MG SL tablet Place 0.4 mg under the tongue every 5 (five) minutes as needed for chest pain (x 3 doses). Reported on 04/14/2016    . nortriptyline (PAMELOR) 10 MG capsule Take 10 mg by mouth every evening.     Marland Kitchen omeprazole (PRILOSEC) 20 MG capsule Take 1 capsule (20 mg total) by mouth 2 (two) times daily. 180 capsule 3  . ondansetron (ZOFRAN ODT) 4 MG disintegrating tablet Take 1 tablet (4 mg total) by mouth every 8 (eight) hours as needed for nausea or vomiting. 60 tablet 11  . OVER THE COUNTER MEDICATION Take 1 capsule by mouth 2 (two) times daily. Integrative Digestive Formula    . OVER THE COUNTER MEDICATION 3 (three) times daily. Berberorine Gluco Defense    . Polyvinyl Alcohol-Povidone (REFRESH OP) Place 1 drop into both eyes daily as needed (dry eyes).    . ranitidine (ZANTAC) 150 MG tablet Take 150 mg by mouth every evening. Reported on 04/14/2016    . sulfamethoxazole-trimethoprim (BACTRIM DS,SEPTRA DS) 800-160 MG tablet Take 1 tablet by mouth 2 (two) times daily.  0  . tiZANidine (ZANAFLEX) 4 MG tablet Take 4 mg by mouth every 8 (eight) hours as needed for muscle spasms.    Marland Kitchen trimethoprim  (TRIMPEX) 100 MG tablet Take 100 mg by mouth every other day.     No facility-administered medications prior to visit.     PAST MEDICAL HISTORY: Past Medical History:  Diagnosis Date  . Abnormal echocardiogram    09/2013: mild LVH, mild focal basal hypertrophy of septum, EF 60-65%, normal WM, grade 1 diastolic dysfunction, MAC, mild LAE, ASA, PASP 34. Possible oscillating MV density - reviewed by MD and felt there was no significant abnormality other than MAC noted with mitral valve and did not require TEE.  . Adenomatous colon polyp   . Allergic rhinitis   . Arthritis    "back; ankles; hands; knees" (10/31/2013)  . Bilateral sensorineural hearing loss   . CAD (coronary artery disease)    a. Nonobst in 2011. b. Abnormal nuc 09/2013 -> s/p cutting balloon to D2, mild LAD disease; c. 08/2015 MV: no ischemia, EF 71%.  . Cancer ([place])    cancerous polyps  . Carcinoma in situ in a polyp 1994   a. 1994 - malignant polyp removed during colonoscopy.  . Charcot's Foot    a.  01/2016 s/p RLE transtibial amputation 2/2 Charcot rocker-bottom deformity and insensate neuropathy ulceration.  . Chronic diastolic CHF (congestive heart failure) ([place])    a. 09/2013 Echo: EF 60-65%, mild LVH, Gr1 DD.  Marland Kitchen Diverticulosis   . DJD (degenerative joint disease)   . DVT (deep venous thrombosis) ([place]) 12/2007  . Dyspnea    a. Chronic, extensive w/u see OV note 09-2010. b. [place] 2011: ormal with RA 5 RV 31/2 PA 27/12 (19) PCW 10 CO normal. No evidence of shunting with sitting up in cath lab. c. CPX 2011: see report. d. Prior fluoro of diaphragm -  R diaphragm elevated at rest but both moved with inspiration.   . Fatty liver   . GERD (gastroesophageal reflux disease)    a. Hx GERD/esophageal dysmotility followed by Dr. Olevia Perches.   Marland Kitchen Headache    Optic migraine  . Hemorrhoids   . Hiatal hernia    a. s/p Nissen fundoplication 2952.  Marland Kitchen Hyperlipidemia    a. patient unwilling to use  statins.  . Hypertensive heart disease    . IBS (irritable bowel syndrome)   . Macular degeneration 03/2009   Dr. Rosana Hoes  . Macular degeneration   . Neuropathy ([place])    a. Hands, feet, legs.  . Orthostatic hypotension   . Osteomyelitis ([place])    a. 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 01/2016 s/p RLE transtibial amputation 2/2 Charcot rocker-bottom deformity and insensate neuropathy ulceration.  . Osteoporosis   . PE (pulmonary embolism) 12/2007   a. PE/DVT after neck surgery 2009. b. coumadin d/c 10-2008.  Marland Kitchen Pneumonia   . PONV (postoperative nausea and vomiting) 2009   neck surgery  . PPD positive   . Pulmonary nodule    incidental per CT:  Pet scan 4-9: likely benign, CT 08-2009 no change, no further CTs (Dr. Gwenette Greet)  . Recurrent UTI   . RSD (reflex sympathetic dystrophy)    a. Chronic pain.  Marland Kitchen Spinal stenosis   . Type II diabetes mellitus ([place])   . Venous insufficiency    a. Contributing to LEE.    PAST SURGICAL HISTORY: Past Surgical History:  Procedure Laterality Date  . AMPUTATION  03/06/2012   Procedure: AMPUTATION FOOT;  Surgeon: Newt Minion, MD;  Location: [facility];  Service: Orthopedics;  Laterality: Left;  FIFTH RAY AMPUTATION   . AMPUTATION Right 02/18/2016   Procedure: AMPUTATION BELOW KNEE;  Surgeon: Newt Minion, MD;  Location: [facility];  Service: Orthopedics;  Laterality: Right;  . ANKLE FUSION  09/27/2012   Procedure: ANKLE FUSION;  Surgeon: Newt Minion, MD;  Location: [facility];  Service: Orthopedics;  Laterality: Left;  Left Tibiocalcaneal Fusion  . ANKLE FUSION Right 05/09/2013   Procedure: ANKLE FUSION;  Surgeon: Newt Minion, MD;  Location: [facility];  Service: Orthopedics;  Laterality: Right;  Excision Osteomyelitis Base 1st MT Right Foot, Fusion Medial Column  . ANKLE SURGERY Left apr & june 1991  . ANTERIOR CERVICAL DECOMP/DISCECTOMY FUSION  12/18/07   For OA,  Dr. Lorin Mercy:  fu by a PE  . CARDIAC CATHETERIZATION  05/2010    at [facility]  . CARPAL  TUNNEL RELEASE Left 09/2011  . CATARACT EXTRACTION W/ INTRAOCULAR LENS  IMPLANT, BILATERAL  2004   feb 2004 left, aug 2004 right  . CHOLECYSTECTOMY  03/2002  . COLONOSCOPY     numerous times  . CORONARY ANGIOPLASTY  10/31/2013  . CYST REMOVAL HAND  06/2003  . FOOT BONE EXCISION Right 06/2009  . LAPAROSCOPIC RIGHT HEMI COLECTOMY N/A 01/08/2015   Procedure: LAPAROSCOPIC ASSISTED RIGHT HEMI COLECTOMY;  Surgeon: Johnathan Hausen, MD;  Location: [facility];  Service: General;  Laterality: N/A;  . LEFT HEART CATHETERIZATION WITH CORONARY ANGIOGRAM N/A 10/31/2013   Procedure: LEFT HEART CATHETERIZATION WITH CORONARY ANGIOGRAM;  Surgeon: Jettie Booze, MD;  Location: [facility] C[facility];  Service: Cardiovascular;  Laterality: N/A;  . NASAL SEPTUM SURGERY  09/1963  . NISSEN FUNDOPLICATION  5/0/0370  . PERCUTANEOUS CORONARY INTERVENTION-BALLOON ONLY  10/31/2013   Procedure: PERCUTANEOUS CORONARY INTERVENTION-BALLOON ONLY;  Surgeon: Jettie Booze, MD;  Location: [facility] CATH LAB;  Service: Cardiovascular;;  . ROTATOR CUFF REPAIR Right 07/2007   Dr. Percell Miller  . ROTATOR CUFF REPAIR Left 11/2004  . TOE AMPUTATION Right 08/2008   3rd toe, Dr. Sharol Given due to osteomyelitis  . VAGINAL HYSTERECTOMY  03/1975  . VEIN LIGATION Bilateral 03/1966  . VENA CAVA FILTER PLACEMENT  01/2010   green filter; "due  to blood clots"  . WISDOM TOOTH EXTRACTION  06/2007    FAMILY HISTORY: Family History  Problem Relation Age of Onset  . Heart disease Mother     mitral valve replaced  . Arthritis Mother   . Diabetic kidney disease Daughter   . Diabetes Father   . Stroke Father   . Migraines Sister   . Hypertension    . Breast cancer    . Headache Sister   . Colon cancer Neg Hx   . Esophageal cancer Neg Hx   . Stomach cancer Neg Hx   . Rectal cancer Neg Hx     SOCIAL HISTORY: Social History   Social History  . Marital status: Divorced    Spouse name: N/A  . Number of children: 3  . Years of education: 15   Occupational  History  . Retired      from [employer]    Social History Main Topics  . Smoking status: Never Smoker  . Smokeless tobacco: Never Used     Comment: tried for a few months in college.   . Alcohol use No  . Drug use: No  . Sexual activity: No   Other Topics Concern  . Not on file   Social History Narrative   Lives: daughter lives w/ her and another daughter lives in town   1 son in [place]   Son comes home every 4 weeks to visit   Caffeine use:  Tea 1/day w/ dinner   Coffee occass                PHYSICAL EXAM  Vitals:   06/28/16 1325  BP: 120/74  Pulse: 72  Height: _0  (1.6 m)   Body mass index is 30.11 kg/m.  Generalized: Well developed, in no acute distress   Neurological examination  Mentation: Alert oriented to time, place, history taking. Follows all commands speech and language fluent Cranial nerve II-XII: Pupils were equal round reactive to light. Extraocular movements were full, visual field were full on confrontational test. Facial sensation and strength were normal. Uvula tongue midline. Head turning and shoulder shrug  were normal and symmetric. Motor: The motor testing reveals 5 over 5 strength of all 4 extremities. Good symmetric motor tone is noted throughout.  Sensory: Sensory testing is intact to soft touch on all 4 extremities. No evidence of extinction is noted.  Coordination: Cerebellar testing reveals good finger-nose-finger and heel-to-shin bilaterally.  Gait and station: Patient in a wheelchair.Marland Kitchen    DIAGNOSTIC DATA (LABS, IMAGING, TESTING) - I reviewed patient records, labs, notes, testing and imaging myself where available.  Lab Results  Component Value Date   WBC 4.7 03/30/2016   HGB 11.0 (L) 04/14/2016   HCT 30.2 (L) 03/30/2016   MCV 88.3 03/30/2016   PLT 218 03/30/2016      Component Value Date/Time   NA 138 05/11/2016 0838   K 4.5 05/11/2016 0838   CL 105 05/11/2016 0838   CO2 24 05/11/2016 0838   GLUCOSE 99 05/11/2016 0838    BUN 17 05/11/2016 0838   CREATININE 0.93 05/11/2016 0838   CALCIUM 8.6 05/11/2016 0838   PROT 6.1 03/30/2016 0909   ALBUMIN 3.7 03/30/2016 0909   AST 9 (L) 03/30/2016 0909   ALT 7 03/30/2016 0909   ALKPHOS 67 03/30/2016 0909   BILITOT 0.3 03/30/2016 0909   GFRNONAA 45 (L) 04/07/2016 1145   GFRAA 52 (L) 04/07/2016 1145    Lab Results  Component Value Date  HGBA1C 5.8 (H) 04/14/2016   Lab Results  Component Value Date   VITAMINB12 >1500 pg/mL (H) 09/10/2009   Lab Results  Component Value Date   TSH 4.01 03/30/2016      ASSESSMENT AND PLAN 81 y.o. year old female  has a past medical history of Abnormal echocardiogram; Adenomatous colon polyp; Allergic rhinitis; Arthritis; Bilateral sensorineural hearing loss; CAD (coronary artery disease); Cancer ([place]); Carcinoma in situ in a polyp (1994); Charcot's Foot; Chronic diastolic CHF (congestive heart failure) ([place]); Diverticulosis; DJD (degenerative joint disease); DVT (deep venous thrombosis) ([place]) (12/2007); Dyspnea; Fatty liver; GERD (gastroesophageal reflux disease); Headache; Hemorrhoids; Hiatal hernia; Hyperlipidemia; Hypertensive heart disease; IBS (irritable bowel syndrome); Macular degeneration (03/2009); Macular degeneration; Neuropathy ([place]); Orthostatic hypotension; Osteomyelitis ([place]); Osteoporosis; PE (pulmonary embolism) (12/2007); Pneumonia; PONV (postoperative nausea and vomiting) (2009); PPD positive; Pulmonary nodule; Recurrent UTI; RSD (reflex sympathetic dystrophy); Spinal stenosis; Type II diabetes mellitus ([place]); and Venous insufficiency. here with:   1. Dizziness  Overall the patient's episodes of dizziness and nausea have decreased. The patient will continue using Zofran and meclizine. Advised that if her episodes of dizziness increase we will send the patient for vestibular rehabilitation. She voiced understanding. She states that she would like to keep her follow-up in November with Dr. Jaynee Eagles.     Ward Givens, MSN, NP-C 06/28/2016, 1:14 PM [facility] [address] [place], [place] [address] [phone]   Personally have participated in and made any corrections needed to history, physical, neuro exam,assessment and plan as stated above.  I have personally discussed with NP, evaluated lab date, reviewed imaging studies and agree with radiology interpretations.    Sarina Ill, MD [facility]

## 2016-06-28 NOTE — Progress Notes (Signed)
Office Visit    Patient Name: [name] Date of Encounter: 06/28/2016  Primary Care Provider:  [name], MD Primary Cardiologist:  Liane Comber, MD   Chief Complaint    81 year old female with prior history of CAD and diastolic dysfunction who was recently seen secondary to lower extremity edema and presents for follow-up.  Past Medical History    Past Medical History:  Diagnosis Date  . Abnormal echocardiogram    09/2013: mild LVH, mild focal basal hypertrophy of septum, EF 60-65%, normal WM, grade 1 diastolic dysfunction, MAC, mild LAE, ASA, PASP 34. Possible oscillating MV density - reviewed by MD and felt there was no significant abnormality other than MAC noted with mitral valve and did not require TEE.  . Adenomatous colon polyp   . Allergic rhinitis   . Arthritis    "back; ankles; hands; knees" (10/31/2013)  . Bilateral sensorineural hearing loss   . CAD (coronary artery disease)    a. Nonobst in 2011. b. Abnormal nuc 09/2013 -> s/p cutting balloon to D2, mild LAD disease; c. 08/2015 MV: no ischemia, EF 71%.  . Cancer ([place])    cancerous polyps  . Carcinoma in situ in a polyp 1994   a. 1994 - malignant polyp removed during colonoscopy.  . Charcot's Foot    a.  01/2016 s/p RLE transtibial amputation 2/2 Charcot rocker-bottom deformity and insensate neuropathy ulceration.  . Chronic diastolic CHF (congestive heart failure) ([place])    a. 09/2013 Echo: EF 60-65%, mild LVH, Gr1 DD.  Marland Kitchen Diverticulosis   . DJD (degenerative joint disease)   . DVT (deep venous thrombosis) ([place]) 12/2007  . Dyspnea    a. Chronic, extensive w/u see OV note 09-2010. b. [place] 2011: ormal with RA 5 RV 31/2 PA 27/12 (19) PCW 10 CO normal. No evidence of shunting with sitting up in cath lab. c. CPX 2011: see report. d. Prior fluoro of diaphragm -  R diaphragm elevated at rest but both moved with inspiration.   . Fatty liver   . GERD (gastroesophageal reflux disease)    a. Hx GERD/esophageal dysmotility  followed by Dr. Olevia Perches.   Marland Kitchen Headache    Optic migraine  . Hemorrhoids   . Hiatal hernia    a. s/p Nissen fundoplication [id number].  Marland Kitchen Hyperlipidemia    a. patient unwilling to use statins.  . Hypertensive heart disease   . IBS (irritable bowel syndrome)   . Macular degeneration 03/2009   Dr. Rosana Hoes  . Macular degeneration   . Neuropathy ([place])    a. Hands, feet, legs.  . Orthostatic hypotension   . Osteomyelitis ([place])    a. 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 01/2016 s/p RLE transtibial amputation 2/2 Charcot rocker-bottom deformity and insensate neuropathy ulceration.  . Osteoporosis   . PE (pulmonary embolism) 12/2007   a. PE/DVT after neck surgery 2009. b. coumadin d/c 10-2008.  Marland Kitchen Pneumonia   . PONV (postoperative nausea and vomiting) 2009   neck surgery  . PPD positive   . Pulmonary nodule    incidental per CT:  Pet scan 4-9: likely benign, CT 08-2009 no change, no further CTs (Dr. Gwenette Greet)  . Recurrent UTI   . RSD (reflex sympathetic dystrophy)    a. Chronic pain.  Marland Kitchen Spinal stenosis   . Type II diabetes mellitus ([place])   . Venous insufficiency    a. Contributing to LEE.   Past Surgical History:  Procedure  Laterality Date  . AMPUTATION  03/06/2012   Procedure: AMPUTATION FOOT;  Surgeon: Newt Minion, MD;  Location: [facility];  Service: Orthopedics;  Laterality: Left;  FIFTH RAY AMPUTATION   . AMPUTATION Right 02/18/2016   Procedure: AMPUTATION BELOW KNEE;  Surgeon: Newt Minion, MD;  Location: [facility];  Service: Orthopedics;  Laterality: Right;  . ANKLE FUSION  09/27/2012   Procedure: ANKLE FUSION;  Surgeon: Newt Minion, MD;  Location: [facility];  Service: Orthopedics;  Laterality: Left;  Left Tibiocalcaneal Fusion  . ANKLE FUSION Right 05/09/2013   Procedure: ANKLE FUSION;  Surgeon: Newt Minion, MD;  Location: [facility];  Service: Orthopedics;  Laterality: Right;  Excision Osteomyelitis Base 1st MT Right Foot, Fusion Medial Column   . ANKLE SURGERY Left apr & june 1991  . ANTERIOR CERVICAL DECOMP/DISCECTOMY FUSION  12/18/07   For OA,  Dr. Lorin Mercy:  fu by a PE  . CARDIAC CATHETERIZATION  05/2010    at [facility]  . CARPAL TUNNEL RELEASE Left 09/2011  . CATARACT EXTRACTION W/ INTRAOCULAR LENS  IMPLANT, BILATERAL  2004   feb 2004 left, aug 2004 right  . CHOLECYSTECTOMY  03/2002  . COLONOSCOPY     numerous times  . CORONARY ANGIOPLASTY  10/31/2013  . CYST REMOVAL HAND  06/2003  . FOOT BONE EXCISION Right 06/2009  . LAPAROSCOPIC RIGHT HEMI COLECTOMY N/A 01/08/2015   Procedure: LAPAROSCOPIC ASSISTED RIGHT HEMI COLECTOMY;  Surgeon: Johnathan Hausen, MD;  Location: [facility];  Service: General;  Laterality: N/A;  . LEFT HEART CATHETERIZATION WITH CORONARY ANGIOGRAM N/A 10/31/2013   Procedure: LEFT HEART CATHETERIZATION WITH CORONARY ANGIOGRAM;  Surgeon: Jettie Booze, MD;  Location: [facility] CATH LAB;  Service: Cardiovascular;  Laterality: N/A;  . NASAL SEPTUM SURGERY  09/1963  . NISSEN FUNDOPLICATION  [id number]  . PERCUTANEOUS CORONARY INTERVENTION-BALLOON ONLY  10/31/2013   Procedure: PERCUTANEOUS CORONARY INTERVENTION-BALLOON ONLY;  Surgeon: Jettie Booze, MD;  Location: [facility] CATH LAB;  Service: Cardiovascular;;  . ROTATOR CUFF REPAIR Right 07/2007   Dr. Percell Miller  . ROTATOR CUFF REPAIR Left 11/2004  . TOE AMPUTATION Right 08/2008   3rd toe, Dr. Sharol Given due to osteomyelitis  . VAGINAL HYSTERECTOMY  03/1975  . VEIN LIGATION Bilateral 03/1966  . VENA CAVA FILTER PLACEMENT  01/2010   green filter; "due to blood clots"  . WISDOM TOOTH EXTRACTION  06/2007    Allergies  Allergies  Allergen Reactions  . Cymbalta [Duloxetine Hcl] Swelling    Swelling in legs  . Duloxetine Swelling    Swelling in legs  . Gabapentin Swelling    Swelling in legs Swelling in legs  . Silicone Hives, Itching, Dermatitis and Rash  . Metformin And Related Other (See Comments)    dizzy, tired, chills, diarrhea, and nausea  . Adhesive [Tape] Rash    rash rash   . Buprenorphine Hcl     Other reaction(s): Other (See Comments) Confusion, constipation.  . Cefazolin Hives    Hives Hives   . Ciprofloxacin Other (See Comments)    Other reaction(s): Other (See Comments) Per pt, caused body aches Per pt, caused body aches   . Clorazepate Dipotassium Other (See Comments)    Unknown reaction  . Clorazepate Dipotassium     Other reaction(s): Other (See Comments) Unknown reaction  . Darifenacin     Other reaction(s): Other (See Comments) hypotension, near syncope Other reaction(s): Other (See Comments) Hypotension, near syncope  . Darifenacin Hydrobromide Other (See Comments)    hypotension,  near syncope  . Dilaudid [Hydromorphone Hcl] Other (See Comments)     confused, intense itching  . Doxycycline Rash  . Enablex [Darifenacin Hydrobromide Er] Other (See Comments)    Hypotension, near syncope  . Levofloxacin Other (See Comments)    Causes wrist pain  . Lyrica [Pregabalin] Swelling    Swelling in legs  . Methadone Hcl Other (See Comments)    Reaction unknown  . Morphine And Related Other (See Comments)    Confusion, constipation.   . Pentazocine Lactate Other (See Comments)    Altered mental state, "climbing walls", anxiety  . Talwin [Pentazocine] Other (See Comments)    "climbing walls" anxiety    History of Present Illness    81 year old female with the above complex past medical history including CAD, hypertension, hyperlipidemia, diabetes mellitus, orthostatic hypotension, postoperative DVT in 2010 status post IVC filter, GERD, reflex sympathetic dystrophy, diastolic dysfunction, and Charcot foot with osteomyelitis status post right lower extremity transtibial amputation in April of this year. Following surgery, she was discharged to rehabilitation. She notes that about a week prior to discharge, one of the staff members at rehabilitation began bringing in country ham biscuits for her on a daily basis. In that setting, she began to  notice lower extremity edema and saw Dr. Meda Coffee one week ago. After labs showed normal renal function, she was prescribed Lasix 40 mg. Concurrently, she was diagnosed with a urinary tract infection by her primary care provider and was placed on antibiotics. By last Saturday, prior to ever taking a dose of Lasix, her grandson noted resolution of lower extremity swelling. He did give her a Lasix tablet last Saturday but has not given her any since as she has had no further swelling and did not have significant response to Lasix. She has otherwise been feeling well. She denies chest pain or dyspnea. She is not particularly active in the setting of her recent amputation. She is not able to weigh herself at home either. She denies PND, orthopnea, dizziness, syncope, edema, or early satiety.  06/28/2016, this is a 3 months follow-up, the patient has been staying with her son in [place] and she is unable to walk post below-knee amputation of her left lower extremity in April of this year. Her fund is healing well and she just recently saw a prosthesis specialist for fitting of her prosthesis. Unfortunately she has responded with allergic reaction to silicone used in a prosthesis. Otherwise she is very excited about it as she was able to walk for the first time in 4 months. She otherwise is compliant to her meds, denies any chest pain or shortness of breath, she states that she sleeps very well has no orthopnea no paroxysmal nocturnal dyspnea. She is currently not using any Lasix and is careful about salt intake. She has experienced an episode of hyponatremia in June that resolved with fluid restriction and repeat labs were normal.  Home Medications    Prior to Admission medications   Medication Sig Start Date End Date Taking? Authorizing Provider  Alpha-Lipoic Acid 600 MG CAPS Take 600 mg by mouth 2 (two) times daily.   Yes Historical Provider, MD  amLODipine (NORVASC) 2.5 MG tablet Take 2.5 mg by mouth  daily.   Yes Historical Provider, MD  amoxicillin (AMOXIL) 500 MG capsule Take 500 mg by mouth 3 (three) times daily. 03/31/16  Yes Historical Provider, MD  aspirin 81 MG tablet Take 81 mg by mouth daily.   Yes Historical Provider, MD  Cholecalciferol (VITAMIN D3) 5000 UNITS CAPS Take 5,000 Units by mouth every evening. Reported on 11/24/2015   Yes Historical Provider, MD  Cinnamon 500 MG TABS Take 1 tablet by mouth 2 (two) times daily.   Yes Historical Provider, MD  Cyanocobalamin (VITAMIN B-12) 2500 MCG SUBL Place 2,500 mcg under the tongue every evening. Reported on 11/24/2015   Yes Historical Provider, MD  diclofenac sodium (VOLTAREN) 1 % GEL Apply 1 application topically daily as needed (pain).   Yes Historical Provider, MD  furosemide (LASIX) 40 MG tablet Take 1 tablet (40 mg total) by mouth daily as needed for fluid or edema. 04/06/16  Yes Rogelia Mire, NP  HYDROcodone-acetaminophen (NORCO) 10-325 MG tablet Take 1 tablet by mouth every 6 (six) hours as needed for moderate pain or severe pain. 02/10/16  Yes Colon Branch, MD  lidocaine (LIDODERM) 5 % Place 1 patch onto the skin daily. Remove & Discard patch within 12 hours or as directed by MD 06/11/15  Yes Colon Branch, MD  lisinopril (PRINIVIL,ZESTRIL) 10 MG tablet Take 1 tablet (10 mg total) by mouth daily. 09/01/15  Yes Dorothy Spark, MD  meclizine (ANTIVERT) 12.5 MG tablet Take 1 tablet (12.5 mg total) by mouth 3 (three) times daily as needed for dizziness. 01/05/16  Yes Colon Branch, MD  metoprolol succinate (TOPROL XL) 25 MG 24 hr tablet Take 25 mg by mouth daily.   Yes Historical Provider, MD  metoprolol succinate (TOPROL-XL) 25 MG 24 hr tablet take 1 tablet by mouth once daily Patient taking differently: take 1 tablet by mouth once daily at bedtime 11/24/15  Yes Dorothy Spark, MD  Multiple Vitamins-Minerals (PRESERVISION AREDS 2) CAPS Take 1 capsule by mouth 2 (two) times daily.   Yes Historical Provider, MD  nabumetone (RELAFEN) 750 MG  tablet Take 750 mg by mouth 2 (two) times daily.   Yes Historical Provider, MD  nitroGLYCERIN (NITROSTAT) 0.4 MG SL tablet Place 0.4 mg under the tongue every 5 (five) minutes as needed for chest pain (x 3 doses).   Yes Historical Provider, MD  nortriptyline (PAMELOR) 10 MG capsule Take 10 mg by mouth every evening.    Yes Historical Provider, MD  omeprazole (PRILOSEC) 20 MG capsule Take 1 capsule (20 mg total) by mouth 2 (two) times daily. 10/29/15  Yes Mauri Pole, MD  ondansetron (ZOFRAN ODT) 4 MG disintegrating tablet Take 1 tablet (4 mg total) by mouth every 8 (eight) hours as needed for nausea or vomiting. 03/15/16  Yes Melvenia Beam, MD  OVER THE COUNTER MEDICATION Take 1 capsule by mouth 2 (two) times daily. Integrative Digestive Formula   Yes Historical Provider, MD  OVER THE COUNTER MEDICATION 3 (three) times daily. Berberorine Gluco Defense   Yes Historical Provider, MD  Polyvinyl Alcohol-Povidone (REFRESH OP) Place 1 drop into both eyes daily as needed (dry eyes).   Yes Historical Provider, MD  ranitidine (ZANTAC) 150 MG tablet Take 150 mg by mouth every evening. Reported on 12/29/2015   Yes Historical Provider, MD  sulfamethoxazole-trimethoprim (BACTRIM DS,SEPTRA DS) 800-160 MG tablet Take 1 tablet by mouth 2 (two) times daily. 02/08/16  Yes Historical Provider, MD  tiZANidine (ZANAFLEX) 4 MG tablet Take 4 mg by mouth every 8 (eight) hours as needed for muscle spasms.   Yes Historical Provider, MD  trimethoprim (TRIMPEX) 100 MG tablet Take 100 mg by mouth every other day.   Yes Historical Provider, MD    Review of Systems  As above, she is been feeling well and has had no further edema.  She denies chest pain, palpitations, dyspnea, pnd, orthopnea, n, v, dizziness, syncope, or early satiety. .  All other systems reviewed and are otherwise negative except as noted above.  Physical Exam    VS:  BP 124/62   Pulse 73   Ht 5\' 3"  (1.6 m)   Wt 170 lb (77.1 kg)   SpO2 98%   BMI  30.11 kg/m  , BMI Body mass index is 30.11 kg/m. GEN: Well nourished, well developed, in no acute distress.  HEENT: normal.  Neck: Supple, no JVD, carotid bruits, or masses. Cardiac: RRR, no murmurs, rubs, or gallops. No clubbing, cyanosis, edema.  Status post right lower extremity amputation. Respiratory:  Respirations regular and unlabored, clear to auscultation bilaterally. GI: Soft, nontender, nondistended, BS + x 4. MS: no deformity or atrophy. Skin: warm and dry, no rash. Neuro:  Strength and sensation are intact. Psych: Normal affect.  Accessory Clinical Findings    ECG - Regular sinus rhythm, first-degree AV block, leftward axis, no acute ST or T changes.  Assessment & Plan    1.  Chronic diastolic congestive heart failure:  Euvolemic, no edema and denies dyspnea, PND, orthopnea, or early satiety. Lasix only PRN, normal Na, Crea, K.   2. Hypertensive heart disease: Blood pressure is stable on amlodipine, metoprolol, and lisinopril.  3. Coronary artery disease: Status post stenting of the diagonal branch in 2015 with nonischemic Myoview in 2016. She has not been having any chest pain. No indication for ischemic evaluation at this time. Continue aspirin, beta blocker, ACE inhibitor. She has previously refused statins.  4. History of acute kidney injury: Creatinine rose to 1.61 on April 28 oh stable at 1.1 in June 2017.  5. Hyperlipidemia: She has previously refused statins.  Follow up in 3 months.  Ena Dawley, NP 06/28/2016, 10:37 AM

## 2016-06-28 NOTE — Patient Instructions (Signed)
Continue using zofran and meclizine before car rides If your symptoms worsen or you develop new symptoms please let us know.

## 2016-06-30 DIAGNOSIS — L97521 Non-pressure chronic ulcer of other part of left foot limited to breakdown of skin: Secondary | ICD-10-CM | POA: Diagnosis not present

## 2016-06-30 DIAGNOSIS — E1142 Type 2 diabetes mellitus with diabetic polyneuropathy: Secondary | ICD-10-CM | POA: Diagnosis not present

## 2016-06-30 DIAGNOSIS — Z89511 Acquired absence of right leg below knee: Secondary | ICD-10-CM | POA: Diagnosis not present

## 2016-07-03 ENCOUNTER — Telehealth: Payer: Self-pay | Admitting: Internal Medicine

## 2016-07-03 NOTE — Telephone Encounter (Signed)
Received Plan of Care from 03/27/2016 to 05/25/2016 from [facility] and Revision to Plan of Care from 04/27/2016. Forms signed by PCP and faxed back to [facility] at [phone]. Forms sent for scanning.

## 2016-07-03 NOTE — Telephone Encounter (Signed)
Caller name: Gwinda Passe with [facility] Can be reached: [phone] Fax: [phone]  Reason for call: faxed orders to be signed for OT and PT for pt. They need orders to be signed in order to bill insurance. They were faxed 06/30/16 to [phone]. She states they had been faxed several times since 04/27/16. Please return asap so they can bill within insurance guidelines.

## 2016-07-03 NOTE — Telephone Encounter (Signed)
Talked to [facility] and she will refax to [phone] now

## 2016-07-03 NOTE — Telephone Encounter (Signed)
Chart reviewed, have not received any forms. PCP also out of office last week, recommend she re-fax to [phone].

## 2016-07-11 ENCOUNTER — Encounter: Payer: Medicare Other | Admitting: Internal Medicine

## 2016-07-20 ENCOUNTER — Telehealth: Payer: Self-pay | Admitting: Internal Medicine

## 2016-07-20 MED ORDER — HYDROCODONE-ACETAMINOPHEN 10-325 MG PO TABS: 1.0000 | ORAL_TABLET | Freq: Four times a day (QID) | ORAL | 0 refills | Status: DC | PRN

## 2016-07-20 NOTE — Telephone Encounter (Signed)
Go back to the previous dose of 10-325 hydrocodone, #120 tabs per month, one prescription, no refills

## 2016-07-20 NOTE — Telephone Encounter (Signed)
Spoke w/ [name], Pt's daughter, informed her that Rx was increased back to Norco 10-325mg  and Rx has been placed at front desk for pick up.

## 2016-07-20 NOTE — Telephone Encounter (Signed)
Rx printed, awaiting MD signature.  

## 2016-07-20 NOTE — Telephone Encounter (Signed)
Please advise 

## 2016-07-20 NOTE — Telephone Encounter (Signed)
°  Relationship to patient: Self  Can be reached: [phone] [name]) Patient request that her daughter be called   Reason for call: Patient request refill on Hydrocodone-Acetaminophen 7.5-300 MG TABS [id number] Patient request a higher dosage because she is having a lot of pain with her prosthetic

## 2016-07-31 ENCOUNTER — Ambulatory Visit (INDEPENDENT_AMBULATORY_CARE_PROVIDER_SITE_OTHER): Payer: Medicare Other | Admitting: Orthopedic Surgery

## 2016-07-31 DIAGNOSIS — E1142 Type 2 diabetes mellitus with diabetic polyneuropathy: Secondary | ICD-10-CM

## 2016-07-31 DIAGNOSIS — Z89511 Acquired absence of right leg below knee: Secondary | ICD-10-CM | POA: Diagnosis not present

## 2016-08-02 DIAGNOSIS — H04123 Dry eye syndrome of bilateral lacrimal glands: Secondary | ICD-10-CM | POA: Diagnosis not present

## 2016-08-02 DIAGNOSIS — H01002 Unspecified blepharitis right lower eyelid: Secondary | ICD-10-CM | POA: Diagnosis not present

## 2016-08-02 DIAGNOSIS — H43393 Other vitreous opacities, bilateral: Secondary | ICD-10-CM | POA: Diagnosis not present

## 2016-08-02 DIAGNOSIS — H01001 Unspecified blepharitis right upper eyelid: Secondary | ICD-10-CM | POA: Diagnosis not present

## 2016-08-02 DIAGNOSIS — H01005 Unspecified blepharitis left lower eyelid: Secondary | ICD-10-CM | POA: Diagnosis not present

## 2016-08-02 DIAGNOSIS — H01004 Unspecified blepharitis left upper eyelid: Secondary | ICD-10-CM | POA: Diagnosis not present

## 2016-08-02 DIAGNOSIS — H43813 Vitreous degeneration, bilateral: Secondary | ICD-10-CM | POA: Diagnosis not present

## 2016-08-02 DIAGNOSIS — H35362 Drusen (degenerative) of macula, left eye: Secondary | ICD-10-CM | POA: Diagnosis not present

## 2016-08-02 DIAGNOSIS — H524 Presbyopia: Secondary | ICD-10-CM | POA: Diagnosis not present

## 2016-08-08 DIAGNOSIS — Z6829 Body mass index (BMI) 29.0-29.9, adult: Secondary | ICD-10-CM | POA: Diagnosis not present

## 2016-08-08 DIAGNOSIS — R300 Dysuria: Secondary | ICD-10-CM | POA: Diagnosis not present

## 2016-08-08 DIAGNOSIS — N390 Urinary tract infection, site not specified: Secondary | ICD-10-CM | POA: Diagnosis not present

## 2016-08-18 DIAGNOSIS — Z6829 Body mass index (BMI) 29.0-29.9, adult: Secondary | ICD-10-CM | POA: Diagnosis not present

## 2016-08-18 DIAGNOSIS — K1379 Other lesions of oral mucosa: Secondary | ICD-10-CM | POA: Diagnosis not present

## 2016-08-23 NOTE — Progress Notes (Signed)
This encounter was created in error - please disregard.  This encounter was created in error - please disregard.

## 2016-09-05 ENCOUNTER — Telehealth: Payer: Self-pay | Admitting: Internal Medicine

## 2016-09-05 MED ORDER — HYDROCODONE-ACETAMINOPHEN 10-325 MG PO TABS: 1.0000 | ORAL_TABLET | Freq: Four times a day (QID) | ORAL | 0 refills | Status: DC | PRN

## 2016-09-05 NOTE — Telephone Encounter (Signed)
Patient is requesting a refill of HYDROcodone-acetaminophen (NORCO) 10-325 MG tablet. Patient states that she is aware that Dr. Larose Kells would like her to start going to a lower dose, however, she has not started her therapy for her prosthetic leg yet. She would like to get her regular dose this time. However, if Dr. Larose Kells does give her the lesser dose she would request two prescriptions. Please advise  Patient phone: [facility], [place]  Patient phone:  [phone]

## 2016-09-05 NOTE — Telephone Encounter (Signed)
Rx printed, awaiting MD signature.  

## 2016-09-05 NOTE — Telephone Encounter (Signed)
Were doing hydrocodone 10 mg. Okay #120, no refills

## 2016-09-05 NOTE — Telephone Encounter (Signed)
Please advise 

## 2016-09-05 NOTE — Telephone Encounter (Signed)
Please inform Pt that Rx is ready for pick up at her convenience. She will need to come to office herself, she is due for UDS.

## 2016-09-07 ENCOUNTER — Other Ambulatory Visit: Payer: Self-pay | Admitting: Internal Medicine

## 2016-09-08 NOTE — Telephone Encounter (Signed)
LVM informing patient that Rx is at the front and she would need to pick it up herself.

## 2016-09-11 ENCOUNTER — Telehealth (INDEPENDENT_AMBULATORY_CARE_PROVIDER_SITE_OTHER): Payer: Self-pay | Admitting: Orthopedic Surgery

## 2016-09-11 ENCOUNTER — Encounter (INDEPENDENT_AMBULATORY_CARE_PROVIDER_SITE_OTHER): Payer: Self-pay | Admitting: Orthopedic Surgery

## 2016-09-11 NOTE — Telephone Encounter (Signed)
Pt. Would like to talk to you to get an RX for PT.  Please call her at [phone]

## 2016-09-11 NOTE — Telephone Encounter (Signed)
Note faxed to [phone]. And I spoke with [name] patients daughter since patient is going to have therapy at her son's house in [place]. Address was included in fax information with his contact information. Patients sons name is Dayane Lacks ph [phone]. Address is [address], [place] [address].

## 2016-09-11 NOTE — Telephone Encounter (Signed)
Patients daughter would like for you to have the following numbers:    [facility]  [phone] Fax [phone]  Please fax the orders for Physical Therapy

## 2016-09-12 ENCOUNTER — Other Ambulatory Visit: Payer: Self-pay | Admitting: Internal Medicine

## 2016-09-12 ENCOUNTER — Telehealth (INDEPENDENT_AMBULATORY_CARE_PROVIDER_SITE_OTHER): Payer: Self-pay | Admitting: Orthopedic Surgery

## 2016-09-12 DIAGNOSIS — Z79891 Long term (current) use of opiate analgesic: Secondary | ICD-10-CM | POA: Diagnosis not present

## 2016-09-12 NOTE — Telephone Encounter (Signed)
[name] needs Oct OV NOTES  w/[facility] along with MED LIST to complete the referral for PT

## 2016-09-13 NOTE — Telephone Encounter (Signed)
Addressed by myself on Monday.

## 2016-09-18 ENCOUNTER — Encounter: Payer: Self-pay | Admitting: Internal Medicine

## 2016-09-18 ENCOUNTER — Ambulatory Visit (INDEPENDENT_AMBULATORY_CARE_PROVIDER_SITE_OTHER): Payer: Medicare Other | Admitting: Internal Medicine

## 2016-09-18 VITALS — BP 132/80 | HR 85 | Temp 98.1°F | Resp 14 | Ht 63.0 in | Wt 170.0 lb

## 2016-09-18 DIAGNOSIS — Z0000 Encounter for general adult medical examination without abnormal findings: Secondary | ICD-10-CM | POA: Diagnosis not present

## 2016-09-18 DIAGNOSIS — I2583 Coronary atherosclerosis due to lipid rich plaque: Secondary | ICD-10-CM

## 2016-09-18 DIAGNOSIS — N390 Urinary tract infection, site not specified: Secondary | ICD-10-CM | POA: Diagnosis not present

## 2016-09-18 DIAGNOSIS — Z23 Encounter for immunization: Secondary | ICD-10-CM | POA: Diagnosis not present

## 2016-09-18 DIAGNOSIS — R197 Diarrhea, unspecified: Secondary | ICD-10-CM

## 2016-09-18 DIAGNOSIS — D649 Anemia, unspecified: Secondary | ICD-10-CM | POA: Diagnosis not present

## 2016-09-18 DIAGNOSIS — I2510 Atherosclerotic heart disease of native coronary artery without angina pectoris: Secondary | ICD-10-CM | POA: Diagnosis not present

## 2016-09-18 DIAGNOSIS — E1142 Type 2 diabetes mellitus with diabetic polyneuropathy: Secondary | ICD-10-CM | POA: Diagnosis not present

## 2016-09-18 DIAGNOSIS — E0590 Thyrotoxicosis, unspecified without thyrotoxic crisis or storm: Secondary | ICD-10-CM

## 2016-09-18 DIAGNOSIS — I10 Essential (primary) hypertension: Secondary | ICD-10-CM

## 2016-09-18 MED ORDER — MECLIZINE HCL 25 MG PO TABS: 25.0000 mg | ORAL_TABLET | Freq: Two times a day (BID) | ORAL | 1 refills | Status: DC | PRN

## 2016-09-18 NOTE — Assessment & Plan Note (Signed)
Dizziness, nausea: Chronic problem, saw neurology 02/2016, dx was BPPV, subsequently saw ENT. Having sx more frequently lately but no worrisome  features. Okay to continue Zofran and meclizine, increase it to 25 mg tablets (usually takes 12.5 2 tabs, two  at a time) . Chronic UTIs:  Was check at a UC in [place] couple of months , UCX +, s/p Augmentin, bactrim d/c apparently d/t resistance ( was told"Bactrim will not help") ; currently pt is very vague about UTI sx, only reports dysuria sometimes. Plan: check a urine culture. Consider abx but again sx are very vague. Diarrhea: Check stool test for C. difficile DM: Diet control, check A1c HTN: cont toprol-amlodipine (on lasix prn edema); check a BMP. Anemia noted on labs, recheck a CBC. Last TSH is slightly elevated, recheck today. Pain management: Currently take hydrocodone ~ 4 times a day, relafen, nortriptyline, voltaren topical. Lidoderm patches do not work. to early to refill narcotics, call in 2 weeks. She takes  high doses of hydrocodone, at some point we'll have to talk about reducing her dose. Follow-up: Recommend a Medicare wellness visit w/ RN. Follow-up w/ me  in 4 months

## 2016-09-18 NOTE — Progress Notes (Signed)
Pre visit review using our clinic review tool, if applicable. No additional management support is needed unless otherwise documented below in the visit note. 

## 2016-09-18 NOTE — Patient Instructions (Addendum)
GO TO THE LAB : Get the blood work , also get a container for stool samples   GO TO [facility] Schedule your next appointment for a  routine checkup in 4 months with me  Also recommend to get a Medicare wellness exam with one of our registered nurse  at your earliest convenience

## 2016-09-18 NOTE — Progress Notes (Signed)
Subjective:    Patient ID: [name], female    DOB: 01-Oct-1935, 81 y.o.   MRN: [id number]  DOS:  09/18/2016 Type of visit - description : here for a medicare wellness but has a number of issues. Will reschedule her wellness check.  History of dizziness and occasional nausea, symptoms worse for the last 2 or 3 days. Denies associated slurred speech, headaches or facial numbness. Would like to rx for meclizine from 12.5 mg to 25 mg.  Was in [place] a couple months ago, she started to "burning up all over", reports that is usually a symptom of UTI. Went to a UC, a urine culture was done, it came back positive, she was told to stop Bactrim and take Augmentin. She is currently taking no antibiotics, she has occasional dysuria but no gross hematuria.   Also for about 10 weeks she is having loose  stools, sometimes they're watery. Admits to occasional lower abdominal discomfort. Denies any mucus in the stools but sometimes has seen red blood per rectum, she thinks related to hemorrhoids.  Pain management: Request a refill on her medications.  DM: Diet control, reports blood sugars are normal  HTN: Good compliance with medication, reports ambulatory BPs are normal    Review of Systems  See  Above   Past Medical History:  Diagnosis Date  . Abnormal echocardiogram    09/2013: mild LVH, mild focal basal hypertrophy of septum, EF 60-65%, normal WM, grade 1 diastolic dysfunction, MAC, mild LAE, ASA, PASP 34. Possible oscillating MV density - reviewed by MD and felt there was no significant abnormality other than MAC noted with mitral valve and did not require TEE.  . Adenomatous colon polyp   . Allergic rhinitis   . Arthritis    "back; ankles; hands; knees" (10/31/2013)  . Bilateral sensorineural hearing loss   . CAD (coronary artery disease)    a. Nonobst in 2011. b. Abnormal nuc 09/2013 -> s/p cutting balloon to D2, mild LAD disease; c. 08/2015 MV: no ischemia, EF 71%.  .  Cancer ([place])    cancerous polyps  . Carcinoma in situ in a polyp 1994   a. 1994 - malignant polyp removed during colonoscopy.  . Charcot's Foot    a.  01/2016 s/p RLE transtibial amputation 2/2 Charcot rocker-bottom deformity and insensate neuropathy ulceration.  . Chronic diastolic CHF (congestive heart failure) ([place])    a. 09/2013 Echo: EF 60-65%, mild LVH, Gr1 DD.  Marland Kitchen Diverticulosis   . DJD (degenerative joint disease)   . DVT (deep venous thrombosis) ([place]) 12/2007  . Dyspnea    a. Chronic, extensive w/u see OV note 09-2010. b. [place] 2011: ormal with RA 5 RV 31/2 PA 27/12 (19) PCW 10 CO normal. No evidence of shunting with sitting up in cath lab. c. CPX 2011: see report. d. Prior fluoro of diaphragm -  R diaphragm elevated at rest but both moved with inspiration.   . Fatty liver   . GERD (gastroesophageal reflux disease)    a. Hx GERD/esophageal dysmotility followed by Dr. Olevia Perches.   Marland Kitchen Headache    Optic migraine  . Hemorrhoids   . Hiatal hernia    a. s/p Nissen fundoplication [id number].  Marland Kitchen Hyperlipidemia    a. patient unwilling to use statins.  . Hypertensive heart disease   . IBS (irritable bowel syndrome)   . Macular degeneration 03/2009   Dr. Rosana Hoes  . Macular degeneration   . Neuropathy ([place])    a. Hands,  feet, legs.  . Orthostatic hypotension   . Osteomyelitis ([place])    a. 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 01/2016 s/p RLE transtibial amputation 2/2 Charcot rocker-bottom deformity and insensate neuropathy ulceration.  . Osteoporosis   . PE (pulmonary embolism) 12/2007   a. PE/DVT after neck surgery 2009. b. coumadin d/c 10-2008.  Marland Kitchen Pneumonia   . PONV (postoperative nausea and vomiting) 2009   neck surgery  . PPD positive   . Pulmonary nodule    incidental per CT:  Pet scan 4-9: likely benign, CT 08-2009 no change, no further CTs (Dr. Gwenette Greet)  . Recurrent UTI   . RSD (reflex sympathetic dystrophy)    a. Chronic pain.    Marland Kitchen Spinal stenosis   . Type II diabetes mellitus ([place])   . Venous insufficiency    a. Contributing to LEE.    Past Surgical History:  Procedure Laterality Date  . AMPUTATION  03/06/2012   Procedure: AMPUTATION FOOT;  Surgeon: Newt Minion, MD;  Location: [facility];  Service: Orthopedics;  Laterality: Left;  FIFTH RAY AMPUTATION   . AMPUTATION Right 02/18/2016   Procedure: AMPUTATION BELOW KNEE;  Surgeon: Newt Minion, MD;  Location: [place] [facility];  Service: Orthopedics;  Laterality: Right;  . ANKLE FUSION  09/27/2012   Procedure: ANKLE FUSION;  Surgeon: Newt Minion, MD;  Location: [facility];  Service: Orthopedics;  Laterality: Left;  Left Tibiocalcaneal Fusion  . ANKLE FUSION Right 05/09/2013   Procedure: ANKLE FUSION;  Surgeon: Newt Minion, MD;  Location: [facility];  Service: Orthopedics;  Laterality: Right;  Excision Osteomyelitis Base 1st MT Right Foot, Fusion Medial Column  . ANKLE SURGERY Left apr & june 1991  . ANTERIOR CERVICAL DECOMP/DISCECTOMY FUSION  12/18/07   For OA,  Dr. Lorin Mercy:  fu by a PE  . CARDIAC CATHETERIZATION  05/2010    at [facility]  . CARPAL TUNNEL RELEASE Left 09/2011  . CATARACT EXTRACTION W/ INTRAOCULAR LENS  IMPLANT, BILATERAL  2004   feb 2004 left, aug 2004 right  . CHOLECYSTECTOMY  03/2002  . COLONOSCOPY     numerous times  . CORONARY ANGIOPLASTY  10/31/2013  . CYST REMOVAL HAND  06/2003  . FOOT BONE EXCISION Right 06/2009  . LAPAROSCOPIC RIGHT HEMI COLECTOMY N/A 01/08/2015   Procedure: LAPAROSCOPIC ASSISTED RIGHT HEMI COLECTOMY;  Surgeon: Johnathan Hausen, MD;  Location: [facility];  Service: General;  Laterality: N/A;  . LEFT HEART CATHETERIZATION WITH CORONARY ANGIOGRAM N/A 10/31/2013   Procedure: LEFT HEART CATHETERIZATION WITH CORONARY ANGIOGRAM;  Surgeon: Jettie Booze, MD;  Location: [facility] C[facility];  Service: Cardiovascular;  Laterality: N/A;  . NASAL SEPTUM SURGERY  09/1963  . NISSEN FUNDOPLICATION  [id number]  . PERCUTANEOUS CORONARY INTERVENTION-BALLOON ONLY  10/31/2013    Procedure: PERCUTANEOUS CORONARY INTERVENTION-BALLOON ONLY;  Surgeon: Jettie Booze, MD;  Location: [facility] CATH LAB;  Service: Cardiovascular;;  . ROTATOR CUFF REPAIR Right 07/2007   Dr. Percell Miller  . ROTATOR CUFF REPAIR Left 11/2004  . TOE AMPUTATION Right 08/2008   3rd toe, Dr. Sharol Given due to osteomyelitis  . VAGINAL HYSTERECTOMY  03/1975  . VEIN LIGATION Bilateral 03/1966  . VENA CAVA FILTER PLACEMENT  01/2010   green filter; "due to blood clots"  . WISDOM TOOTH EXTRACTION  06/2007    Social History   Social History  . Marital status: Divorced    Spouse name: N/A  . Number of children: 3  . Years  of education: 15   Occupational History  . Retired      from [employer]    Social History Main Topics  . Smoking status: Never Smoker  . Smokeless tobacco: Never Used     Comment: tried for a few months in college.   . Alcohol use No  . Drug use: No  . Sexual activity: No   Other Topics Concern  . Not on file   Social History Narrative   Lives: daughter lives w/ her and another daughter lives in town   1 son in [place]   Son comes home every 4 weeks to visit   Caffeine use:  Tea 1/day w/ dinner   Coffee occass                  Medication List       Accurate as of 09/18/16  8:42 PM. Always use your most recent med list.          Alpha-Lipoic Acid 600 MG Caps Take 600 mg by mouth 2 (two) times daily.   amLODipine 2.5 MG tablet Commonly known as:  NORVASC Take 2.5 mg by mouth daily.   aspirin 81 MG tablet Take 81 mg by mouth daily.   Cinnamon 500 MG Tabs Take 1 tablet by mouth 2 (two) times daily.   diclofenac sodium 1 % Gel Commonly known as:  VOLTAREN Apply 1 application topically daily as needed (pain).   freestyle lancets Check blood sugar twice daily   HYDROcodone-acetaminophen 10-325 MG tablet Commonly known as:  NORCO Take 1 tablet by mouth every 6 (six) hours as needed for moderate pain.   meclizine 25 MG tablet Commonly known as:   ANTIVERT Take 1 tablet (25 mg total) by mouth 2 (two) times daily as needed for dizziness.   nabumetone 750 MG tablet Commonly known as:  RELAFEN Take 750 mg by mouth 2 (two) times daily.   nitroGLYCERIN 0.4 MG SL tablet Commonly known as:  NITROSTAT Place 0.4 mg under the tongue every 5 (five) minutes as needed for chest pain (x 3 doses). Reported on 04/14/2016   nortriptyline 10 MG capsule Commonly known as:  PAMELOR Take 10 mg by mouth every evening.   omeprazole 20 MG capsule Commonly known as:  PRILOSEC Take 1 capsule (20 mg total) by mouth 2 (two) times daily.   ondansetron 4 MG disintegrating tablet Commonly known as:  ZOFRAN ODT Take 1 tablet (4 mg total) by mouth every 8 (eight) hours as needed for nausea or vomiting.   OVER THE COUNTER MEDICATION 3 (three) times daily. Berberorine Gluco Defense   OVER THE COUNTER MEDICATION Take 1 capsule by mouth 2 (two) times daily. Integrative Digestive Formula   PRESERVISION AREDS 2 Caps Take 1 capsule by mouth 2 (two) times daily.   ranitidine 150 MG tablet Commonly known as:  ZANTAC Take 150 mg by mouth every evening. Reported on 04/14/2016   REFRESH OP Place 1 drop into both eyes daily as needed (dry eyes).   tiZANidine 4 MG tablet Commonly known as:  ZANAFLEX Take 4 mg by mouth every 8 (eight) hours as needed for muscle spasms.   TOPROL XL 25 MG 24 hr tablet Generic drug:  metoprolol succinate Take 25 mg by mouth daily.   Vitamin B-12 2500 MCG Subl Place 2,500 mcg under the tongue every evening. Reported on 11/24/2015   Vitamin D3 5000 units Caps Take 5,000 Units by mouth every evening. Reported on 11/24/2015  Objective:   Physical Exam BP 132/80 (BP Location: Left Arm, Patient Position: Sitting, Cuff Size: Small)   Pulse 85   Temp 98.1 F (36.7 C) (Oral)   Resp 14   Ht 5\' 3"  (1.6 m)   Wt 170 lb (77.1 kg) Comment: Per Pt  SpO2 96%   BMI 30.11 kg/m  General:   Well developed, well nourished,  sitting a wheelchair, status post BKA (right), no distress HEENT:  Normocephalic . Face symmetric, atraumatic Lungs:  CTA B Normal respiratory effort, no intercostal retractions, no accessory muscle use. Heart: RRR,  no murmur.   Abdomen:  Not distended, soft, non-tender. No rebound or rigidity.  Skin: Not pale. Not jaundice Neurologic:  alert & oriented X3.  Speech normal  Psych--  Cognition and judgment appear intact.  Cooperative with normal attention span and concentration.  Behavior appropriate. No anxious or depressed appearing.    Assessment & Plan:    Assessment DM - metformin intolerant  (lethargic) HTN  Hyperlipidemia-- won't take statins  CV: ---CAD,  ---Chronic diastolic CHF GI: GERD, IBS, colon polyps, PUD, abdominal pain "post polypectomy syndrome" Osteoporosis  MSK,pain mngmt : --Reflex sympathetic dystrophy --Neuropathy --DJD --Spinal stenosis  --H/o Osteomyelitis  Foot --s/p amputation:  R - L toes , R BKA  (01-2016, dx osteomyelitis) -- sees Dr Sharol Given prn Takes nortriptyline also for leg cramps. She had local injections with Dr. Maia Petties 2016 w/o apparent help. She took oxycodone after surgery and that did NOT seem to help better than hydrocodone. Intolerant to Cymbalta, Neurontin ; Lyrica helped but caused edema. Recurrent UTIs -- bactrim q d Venous insufficiency  H/o  + PPD H/o hypothyroidism: TSHs stable H/o dyspnea  PLAN:  Dizziness, nausea: Chronic problem, saw neurology 02/2016, dx was BPPV, subsequently saw ENT. Having sx more frequently lately but no worrisome  features. Okay to continue Zofran and meclizine, increase it to 25 mg tablets (usually takes 12.5 2 tabs, two  at a time) . Chronic UTIs:  Was check at a UC in [place] couple of months , UCX +, s/p Augmentin, bactrim d/c apparently d/t resistance ( was told"Bactrim will not help") ; currently pt is very vague about UTI sx, only reports dysuria sometimes. Plan: check a urine  culture. Consider abx but again sx are very vague. Diarrhea: Check stool test for C. difficile DM: Diet control, check A1c HTN: cont toprol-amlodipine (on lasix prn edema); check a BMP. Anemia noted on labs, recheck a CBC. Last TSH is slightly elevated, recheck today. Pain management: Currently take hydrocodone ~ 4 times a day, relafen, nortriptyline, voltaren topical. Lidoderm patches do not work. to early to refill narcotics, call in 2 weeks. She takes  high doses of hydrocodone, at some point we'll have to talk about reducing her dose. Follow-up: Recommend a Medicare wellness visit w/ RN. Follow-up w/ me  in 4 months

## 2016-09-19 ENCOUNTER — Other Ambulatory Visit: Payer: Self-pay | Admitting: Internal Medicine

## 2016-09-19 DIAGNOSIS — R197 Diarrhea, unspecified: Secondary | ICD-10-CM | POA: Diagnosis not present

## 2016-09-19 LAB — BASIC METABOLIC PANEL
BUN: 10 mg/dL (ref 6–23)
CO2: 25 mEq/L (ref 19–32)
Calcium: 9 mg/dL (ref 8.4–10.5)
Chloride: 104 mEq/L (ref 96–112)
Creatinine, Ser: 1.08 mg/dL (ref 0.40–1.20)
GFR: 51.98 mL/min — ABNORMAL LOW (ref >60.00)
Glucose, Bld: 115 mg/dL — ABNORMAL HIGH (ref 70–99)
Potassium: 4.1 mEq/L (ref 3.5–5.1)
Sodium: 138 mEq/L (ref 135–145)

## 2016-09-19 LAB — URINALYSIS, ROUTINE W REFLEX MICROSCOPIC
Bilirubin Urine: NEGATIVE
Hgb urine dipstick: NEGATIVE
Ketones, ur: NEGATIVE
Nitrite: NEGATIVE
Specific Gravity, Urine: 1.015 (ref 1.000–1.030)
Total Protein, Urine: NEGATIVE
Urine Glucose: NEGATIVE
Urobilinogen, UA: 0.2 (ref 0.0–1.0)
pH: 6 (ref 5.0–8.0)

## 2016-09-19 LAB — TSH: TSH: 1.45 u[IU]/mL (ref 0.35–4.50)

## 2016-09-19 LAB — HEMOGLOBIN A1C: Hgb A1c MFr Bld: 5.7 % (ref 4.6–6.5)

## 2016-09-19 NOTE — Telephone Encounter (Signed)
[facility] to [facility] [phone]

## 2016-09-20 ENCOUNTER — Telehealth (INDEPENDENT_AMBULATORY_CARE_PROVIDER_SITE_OTHER): Payer: Self-pay | Admitting: Orthopedic Surgery

## 2016-09-20 ENCOUNTER — Ambulatory Visit: Payer: Medicare Other | Admitting: Neurology

## 2016-09-20 ENCOUNTER — Other Ambulatory Visit: Payer: Medicare Other | Admitting: *Deleted

## 2016-09-20 DIAGNOSIS — I110 Hypertensive heart disease with heart failure: Secondary | ICD-10-CM | POA: Diagnosis not present

## 2016-09-20 DIAGNOSIS — I2510 Atherosclerotic heart disease of native coronary artery without angina pectoris: Secondary | ICD-10-CM | POA: Diagnosis not present

## 2016-09-20 DIAGNOSIS — I5033 Acute on chronic diastolic (congestive) heart failure: Secondary | ICD-10-CM | POA: Diagnosis not present

## 2016-09-20 LAB — CBC WITH DIFFERENTIAL/PLATELET
Basophils Absolute: 0 10*3/uL (ref 0.0–0.1)
Basophils Absolute: 0 cells/uL (ref 0–200)
Basophils Relative: 0.2 % (ref 0.0–3.0)
Basophils Relative: 0 %
Eosinophils Absolute: 0.2 10*3/uL (ref 0.0–0.7)
Eosinophils Absolute: 144 cells/uL (ref 15–500)
Eosinophils Relative: 2.6 % (ref 0.0–5.0)
Eosinophils Relative: 3 %
HCT: 37 % (ref 36.0–46.0)
HCT: 36.9 % (ref 35.0–45.0)
Hemoglobin: 12.3 g/dL (ref 12.0–15.0)
Hemoglobin: 12 g/dL (ref 11.7–15.5)
Lymphocytes Relative: 35.3 % (ref 12.0–46.0)
Lymphocytes Relative: 40 %
Lymphs Abs: 2.1 10*3/uL (ref 0.7–4.0)
Lymphs Abs: 1920 cells/uL (ref 850–3900)
MCH: 29.5 pg (ref 27.0–33.0)
MCHC: 33.3 g/dL (ref 30.0–36.0)
MCHC: 32.5 g/dL (ref 32.0–36.0)
MCV: 89.3 fl (ref 78.0–100.0)
MCV: 90.7 fL (ref 80.0–100.0)
MPV: 9 fL (ref 7.5–12.5)
Monocytes Absolute: 0.2 10*3/uL (ref 0.1–1.0)
Monocytes Absolute: 336 cells/uL (ref 200–950)
Monocytes Relative: 4.1 % (ref 3.0–12.0)
Monocytes Relative: 7 %
Neutro Abs: 3.4 10*3/uL (ref 1.4–7.7)
Neutro Abs: 2400 cells/uL (ref 1500–7800)
Neutrophils Relative %: 57.8 % (ref 43.0–77.0)
Neutrophils Relative %: 50 %
Platelets: 199 10*3/uL (ref 150.0–400.0)
Platelets: 183 10*3/uL (ref 140–400)
RBC: 4.14 Mil/uL (ref 3.87–5.11)
RBC: 4.07 MIL/uL (ref 3.80–5.10)
RDW: 13.9 % (ref 11.5–15.5)
RDW: 13.8 % (ref 11.0–15.0)
WBC: 5.9 10*3/uL (ref 4.0–10.5)
WBC: 4.8 10*3/uL (ref 3.8–10.8)

## 2016-09-20 LAB — COMPREHENSIVE METABOLIC PANEL
ALT: 8 U/L (ref 6–29)
AST: 13 U/L (ref 10–35)
Albumin: 3.9 g/dL (ref 3.6–5.1)
Alkaline Phosphatase: 59 U/L (ref 33–130)
BUN: 11 mg/dL (ref 7–25)
CO2: 25 mmol/L (ref 20–31)
Calcium: 9.1 mg/dL (ref 8.6–10.4)
Chloride: 105 mmol/L (ref 98–110)
Creat: 1.02 mg/dL — ABNORMAL HIGH (ref 0.60–0.93)
Glucose, Bld: 129 mg/dL — ABNORMAL HIGH (ref 65–99)
Potassium: 3.7 mmol/L (ref 3.5–5.3)
Sodium: 140 mmol/L (ref 135–146)
Total Bilirubin: 0.5 mg/dL (ref 0.2–1.2)
Total Protein: 6.2 g/dL (ref 6.1–8.1)

## 2016-09-20 LAB — URINE CULTURE: Colony Count: >=100000

## 2016-09-20 LAB — TSH: TSH: 1.44 mIU/L

## 2016-09-20 NOTE — Telephone Encounter (Signed)
Brittney w/[facility] would like to know who will be signing orders for Patient  She will have her first session on Tuesday Dec 5th.

## 2016-09-21 ENCOUNTER — Telehealth: Payer: Self-pay | Admitting: Cardiology

## 2016-09-21 LAB — C. DIFFICILE GDH AND TOXIN A/B
C. difficile GDH: NOT DETECTED
C. difficile Toxin A/B: NOT DETECTED

## 2016-09-21 NOTE — Telephone Encounter (Signed)
Notified the pt that per Dr Meda Coffee, all labs were normal including CMET. CBC, and TSH.  Per the pt, she is scheduled to see Dr Meda Coffee on 12/13, and request to cancel that appt and schedule a follow-up in later Jan 2018.  Pt request this for commute reasons.  Cancelled pts 12/13 appt and rescheduled her for 11/16/16 at 0800 with Dr Meda Coffee.  Advised the pt that if she has any urgent cardiac needs before then, she should notify us here at the office, to have an appt scheduled with an Extender.  Pt verbalized understanding and agrees with this plan.

## 2016-09-21 NOTE — Telephone Encounter (Signed)
New message ° ° ° ° °Returning a call to the nurse °

## 2016-09-21 NOTE — Telephone Encounter (Signed)
I called to advise that Dr. Sharol Given will be signing orders for therapy.

## 2016-09-25 ENCOUNTER — Telehealth: Payer: Self-pay | Admitting: Cardiology

## 2016-09-25 DIAGNOSIS — J449 Chronic obstructive pulmonary disease, unspecified: Secondary | ICD-10-CM | POA: Diagnosis present

## 2016-09-25 DIAGNOSIS — D62 Acute posthemorrhagic anemia: Secondary | ICD-10-CM | POA: Diagnosis not present

## 2016-09-25 DIAGNOSIS — I480 Paroxysmal atrial fibrillation: Secondary | ICD-10-CM | POA: Diagnosis not present

## 2016-09-25 DIAGNOSIS — Z9861 Coronary angioplasty status: Secondary | ICD-10-CM | POA: Diagnosis not present

## 2016-09-25 DIAGNOSIS — R222 Localized swelling, mass and lump, trunk: Secondary | ICD-10-CM | POA: Diagnosis not present

## 2016-09-25 DIAGNOSIS — R918 Other nonspecific abnormal finding of lung field: Secondary | ICD-10-CM | POA: Diagnosis not present

## 2016-09-25 DIAGNOSIS — R8271 Bacteriuria: Secondary | ICD-10-CM | POA: Diagnosis not present

## 2016-09-25 DIAGNOSIS — I2582 Chronic total occlusion of coronary artery: Secondary | ICD-10-CM | POA: Diagnosis not present

## 2016-09-25 DIAGNOSIS — R0989 Other specified symptoms and signs involving the circulatory and respiratory systems: Secondary | ICD-10-CM | POA: Diagnosis not present

## 2016-09-25 DIAGNOSIS — Z89511 Acquired absence of right leg below knee: Secondary | ICD-10-CM | POA: Diagnosis not present

## 2016-09-25 DIAGNOSIS — I2510 Atherosclerotic heart disease of native coronary artery without angina pectoris: Secondary | ICD-10-CM | POA: Diagnosis not present

## 2016-09-25 DIAGNOSIS — H04123 Dry eye syndrome of bilateral lacrimal glands: Secondary | ICD-10-CM | POA: Diagnosis present

## 2016-09-25 DIAGNOSIS — I214 Non-ST elevation (NSTEMI) myocardial infarction: Secondary | ICD-10-CM | POA: Diagnosis not present

## 2016-09-25 DIAGNOSIS — Z881 Allergy status to other antibiotic agents status: Secondary | ICD-10-CM | POA: Diagnosis not present

## 2016-09-25 DIAGNOSIS — J9811 Atelectasis: Secondary | ICD-10-CM | POA: Diagnosis not present

## 2016-09-25 DIAGNOSIS — N3090 Cystitis, unspecified without hematuria: Secondary | ICD-10-CM | POA: Diagnosis not present

## 2016-09-25 DIAGNOSIS — Z889 Allergy status to unspecified drugs, medicaments and biological substances status: Secondary | ICD-10-CM | POA: Diagnosis not present

## 2016-09-25 DIAGNOSIS — Z951 Presence of aortocoronary bypass graft: Secondary | ICD-10-CM | POA: Diagnosis not present

## 2016-09-25 DIAGNOSIS — R079 Chest pain, unspecified: Secondary | ICD-10-CM | POA: Diagnosis not present

## 2016-09-25 DIAGNOSIS — Z1620 Resistance to unspecified antibiotic: Secondary | ICD-10-CM | POA: Diagnosis not present

## 2016-09-25 DIAGNOSIS — Z885 Allergy status to narcotic agent status: Secondary | ICD-10-CM | POA: Diagnosis not present

## 2016-09-25 DIAGNOSIS — B961 Klebsiella pneumoniae [K. pneumoniae] as the cause of diseases classified elsewhere: Secondary | ICD-10-CM | POA: Diagnosis not present

## 2016-09-25 DIAGNOSIS — E1151 Type 2 diabetes mellitus with diabetic peripheral angiopathy without gangrene: Secondary | ICD-10-CM | POA: Diagnosis not present

## 2016-09-25 DIAGNOSIS — Z4682 Encounter for fitting and adjustment of non-vascular catheter: Secondary | ICD-10-CM | POA: Diagnosis not present

## 2016-09-25 DIAGNOSIS — Z888 Allergy status to other drugs, medicaments and biological substances status: Secondary | ICD-10-CM | POA: Diagnosis not present

## 2016-09-25 DIAGNOSIS — I10 Essential (primary) hypertension: Secondary | ICD-10-CM | POA: Diagnosis present

## 2016-09-25 DIAGNOSIS — N390 Urinary tract infection, site not specified: Secondary | ICD-10-CM | POA: Diagnosis not present

## 2016-09-25 DIAGNOSIS — Z86711 Personal history of pulmonary embolism: Secondary | ICD-10-CM | POA: Diagnosis not present

## 2016-09-25 NOTE — Telephone Encounter (Signed)
I hope taht everything goes well with her, thank you for letting us know and keep [place] informed.

## 2016-09-25 NOTE — Telephone Encounter (Signed)
New message      Calling to let you know that pt is in the ER at [place], [place] with an heart attack.  They will probably keep her

## 2016-09-25 NOTE — Telephone Encounter (Signed)
Pts Daughter is calling to inform Dr Meda Coffee and myself that the pt was with her son in [place], near [place], in the son's home (where pt is living right now), when she began to have chest pain and pressure.  Son then called 911, where the pt was transported to the ER in [facility]  Per the pts Daughter, they did an EKG and it showed nothing acute.  Daughter then stated that they then did a Troponin level on the pt and noted this was elevated.  Daughter states that the pt is now admitted to the Hospital in [facility], and will be having a cardiac cath scheduled for tomorrow morning, for the Cardiologist suspects a blockage. Per the Daughter, she wanted to let Dr Meda Coffee know this, for when the pt is discharged, she will possibly need sooner follow-up with [place], if pt is stable to commute that far.  Apologies given to the family and informed the pts daughter to have the Admitting MD contact us, if  any cardiac history/records/results may be needed to assist with caring for the pt.  Informed the Daughter that I will make Dr Meda Coffee aware of this, and to please keep [place] informed.  Daughter verbalized understanding and gracious for the call back.

## 2016-09-26 ENCOUNTER — Telehealth: Payer: Self-pay | Admitting: Internal Medicine

## 2016-09-26 ENCOUNTER — Telehealth: Payer: Self-pay | Admitting: Cardiology

## 2016-09-26 NOTE — Telephone Encounter (Signed)
FYI:Pt's dtr called to update you on patient's condition, she talked with you last week about her being admitted to the Hospital in [place] had a Cath this am-has two blockages and will be having a bypass-

## 2016-09-26 NOTE — Telephone Encounter (Signed)
Pts Daughter is calling to update Dr Meda Coffee and myself of pts current condition and plan, since admitted to [facility] yesterday for cardiac complaints.  Per the Daughter, they did the pts cath today, and noted that the pt has 2 known blockages.  Per the pts Daughter, she is unsure which vessels are involved, but one is 100 % blocked, and the other was 90 % blocked.  Daughter states that they will be doing Bypass either today or early tomorrow morning on the pt.  Daughter just wanted to keep [place] updated, and will follow-up with [place] on how the pt is doing, post-Bypass.  The Daughter states that the time-line-of-events looks as if she will be in ICU for a couple days, then moved to step down, then eventually transitioned into a rehab facility thereafter.  Daughter states she will notify our office, if Dr Meda Coffee or myself need to do anything in aiding with her Mother's care.  Gave her our well wishes and to please keep [place] updated.  Informed the pts Daughter that I will make Dr Meda Coffee aware of this.  Daughter verbalized understanding and gracious for the prompt follow-up.

## 2016-09-26 NOTE — Telephone Encounter (Signed)
Thank you for the update!

## 2016-09-26 NOTE — Telephone Encounter (Signed)
Thank you for letting me know

## 2016-09-26 NOTE — Telephone Encounter (Signed)
Caller name: [name] Relationship to patient: Daughter Can be reached: [phone]  Pharmacy:  Reason for call: FYI: Daughter [name] called to inform provider that patient is in [place], [place] and was taken to hospital on yesterday. Patient had a heart attack and had a cardiac cath done this morning. Patient is being taken to OR for a bypass operation.

## 2016-09-27 ENCOUNTER — Telehealth: Payer: Self-pay | Admitting: *Deleted

## 2016-09-27 HISTORY — PX: CORONARY ARTERY BYPASS GRAFT: SHX141

## 2016-09-27 NOTE — Telephone Encounter (Signed)
Called patient and left message to return call

## 2016-09-28 ENCOUNTER — Telehealth: Payer: Self-pay | Admitting: Cardiology

## 2016-09-28 NOTE — Telephone Encounter (Signed)
[name] is calling to give an update , Mrs. Lamphier had her surgery on yesterday and she is doing fine and the doctors are please with the progress. . Thanks

## 2016-09-28 NOTE — Telephone Encounter (Signed)
Sales,Denise called regarding informing PCP patient is currently in [place] recovering from surgery and will call back in the new year to schedule medicare wellness.

## 2016-09-28 NOTE — Telephone Encounter (Signed)
Will route this message to Dr Meda Coffee to make her aware of pts current status and progress she is making, since her Bypass yesterday.   Left a detailed message on the Daughter's VM to continue to keep [place] updated and sending our thoughts that her Mother's progress continues.

## 2016-10-02 ENCOUNTER — Telehealth: Payer: Self-pay | Admitting: Cardiology

## 2016-10-02 ENCOUNTER — Telehealth: Payer: Self-pay

## 2016-10-02 DIAGNOSIS — R269 Unspecified abnormalities of gait and mobility: Secondary | ICD-10-CM | POA: Diagnosis not present

## 2016-10-02 DIAGNOSIS — Z6836 Body mass index (BMI) 36.0-36.9, adult: Secondary | ICD-10-CM | POA: Diagnosis not present

## 2016-10-02 DIAGNOSIS — E119 Type 2 diabetes mellitus without complications: Secondary | ICD-10-CM | POA: Diagnosis not present

## 2016-10-02 DIAGNOSIS — R279 Unspecified lack of coordination: Secondary | ICD-10-CM | POA: Diagnosis not present

## 2016-10-02 DIAGNOSIS — I2582 Chronic total occlusion of coronary artery: Secondary | ICD-10-CM | POA: Diagnosis not present

## 2016-10-02 DIAGNOSIS — I214 Non-ST elevation (NSTEMI) myocardial infarction: Secondary | ICD-10-CM | POA: Diagnosis not present

## 2016-10-02 DIAGNOSIS — Z4682 Encounter for fitting and adjustment of non-vascular catheter: Secondary | ICD-10-CM | POA: Diagnosis not present

## 2016-10-02 DIAGNOSIS — G6281 Critical illness polyneuropathy: Secondary | ICD-10-CM | POA: Diagnosis not present

## 2016-10-02 DIAGNOSIS — R079 Chest pain, unspecified: Secondary | ICD-10-CM | POA: Diagnosis not present

## 2016-10-02 DIAGNOSIS — Z951 Presence of aortocoronary bypass graft: Secondary | ICD-10-CM | POA: Diagnosis not present

## 2016-10-02 DIAGNOSIS — E669 Obesity, unspecified: Secondary | ICD-10-CM | POA: Diagnosis not present

## 2016-10-02 DIAGNOSIS — I10 Essential (primary) hypertension: Secondary | ICD-10-CM | POA: Diagnosis not present

## 2016-10-02 DIAGNOSIS — Z89511 Acquired absence of right leg below knee: Secondary | ICD-10-CM | POA: Diagnosis not present

## 2016-10-02 DIAGNOSIS — J9811 Atelectasis: Secondary | ICD-10-CM | POA: Diagnosis not present

## 2016-10-02 DIAGNOSIS — M6281 Muscle weakness (generalized): Secondary | ICD-10-CM | POA: Diagnosis not present

## 2016-10-02 DIAGNOSIS — I2510 Atherosclerotic heart disease of native coronary artery without angina pectoris: Secondary | ICD-10-CM | POA: Diagnosis not present

## 2016-10-02 DIAGNOSIS — Z7901 Long term (current) use of anticoagulants: Secondary | ICD-10-CM | POA: Diagnosis not present

## 2016-10-02 DIAGNOSIS — I25701 Atherosclerosis of coronary artery bypass graft(s), unspecified, with angina pectoris with documented spasm: Secondary | ICD-10-CM | POA: Diagnosis not present

## 2016-10-02 DIAGNOSIS — R300 Dysuria: Secondary | ICD-10-CM | POA: Diagnosis not present

## 2016-10-02 DIAGNOSIS — I25799 Atherosclerosis of other coronary artery bypass graft(s) with unspecified angina pectoris: Secondary | ICD-10-CM | POA: Diagnosis not present

## 2016-10-02 DIAGNOSIS — J449 Chronic obstructive pulmonary disease, unspecified: Secondary | ICD-10-CM | POA: Diagnosis not present

## 2016-10-02 DIAGNOSIS — Z86711 Personal history of pulmonary embolism: Secondary | ICD-10-CM | POA: Diagnosis not present

## 2016-10-02 DIAGNOSIS — G9050 Complex regional pain syndrome I, unspecified: Secondary | ICD-10-CM | POA: Diagnosis not present

## 2016-10-02 DIAGNOSIS — Z86718 Personal history of other venous thrombosis and embolism: Secondary | ICD-10-CM | POA: Diagnosis not present

## 2016-10-02 DIAGNOSIS — R0789 Other chest pain: Secondary | ICD-10-CM | POA: Diagnosis not present

## 2016-10-02 DIAGNOSIS — S90935A Unspecified superficial injury of left lesser toe(s), initial encounter: Secondary | ICD-10-CM | POA: Diagnosis not present

## 2016-10-02 DIAGNOSIS — I739 Peripheral vascular disease, unspecified: Secondary | ICD-10-CM | POA: Diagnosis not present

## 2016-10-02 DIAGNOSIS — Z48812 Encounter for surgical aftercare following surgery on the circulatory system: Secondary | ICD-10-CM | POA: Diagnosis not present

## 2016-10-02 DIAGNOSIS — L97529 Non-pressure chronic ulcer of other part of left foot with unspecified severity: Secondary | ICD-10-CM | POA: Diagnosis not present

## 2016-10-02 DIAGNOSIS — R339 Retention of urine, unspecified: Secondary | ICD-10-CM | POA: Diagnosis not present

## 2016-10-02 NOTE — Telephone Encounter (Signed)
UDS: 09/12/2016  Positive for Hydrocodone Positive for Nortriptyline  Low risk per Dr. Larose Kells 10/02/2016

## 2016-10-02 NOTE — Telephone Encounter (Signed)
Follow Up:     Please call,daughter have some concerns about her mother,Her mother is a pt in the hospital  in [facility]. They wat to release the pt today straight from ICU to a [place]. Please call her asap

## 2016-10-02 NOTE — Telephone Encounter (Signed)
Pts Daughter is calling to provide an update on how the pt is doing.  Per the Daughter, the pt is doing well, but she has been going in and out of afib.  Daughter states that to her knowledge, afib is a new cardiac issue for the pt.  Daughter states she is worried, for the pt is still admitted to [facility] in ICU, and the Case Manager came to her this morning stating that they will be discharging the pt to an [facility] for further care.  Per the pts Daughter, she is worried, for the pt has been going in and out of afib, with her HR as high as 158 bpm.  Daughter feels as if they should keep the pt longer, to address her afib.  Daughter states that the Providers there, are telling her that afib is expected with everything the pt has been going through with her heart.  Daughter is worried that they will be discharging her too soon.  Daughter is unable to recollect what the MD's are doing to treat her new onset afib.  Informed the pts Daughter that if they will be considering to discharge her to an acute rehab center, she should ask if this is telemetry or not, for she will need that consistent monitoring.  Advised the pts Daughter that she should voice these concerns to the Providers caring for her mother.  Daughter verbalized understanding and agrees with this plan.  Will route this message to Dr Meda Coffee, to make her aware of this conversation.  Daughter states she will keep [place] updated.

## 2016-10-03 NOTE — Telephone Encounter (Signed)
Pts daughter is calling to update [place] on pts current condition.  Per the Daughter, the pt was discharged from [facility], post-bypass yesterday.  Daughter states she was transferred to a rehab center for the next 2 weeks, with PT/OT.  Daughter states that the pt was started on a statin for known CAD and prevention.  Per the Daughter, she states once the pt is discharged and released back in Dr Francesca Oman care, she would like for Dr Meda Coffee to follow the pts lipids from here on out.  Informed the pts Daughter that would be no problem at all.  Informed the Daughter that we are gracious for her keeping [place] up-to-date, and I will let Dr Meda Coffee know about her progress.  Daughter verbalized understanding and gracious for all the assistance provided.

## 2016-10-03 NOTE — Telephone Encounter (Signed)
Follow up      Calling to give nurse update on bypass surgery pt had in [facility]

## 2016-10-04 ENCOUNTER — Ambulatory Visit: Payer: Medicare Other | Admitting: Cardiology

## 2016-10-05 DIAGNOSIS — I2510 Atherosclerotic heart disease of native coronary artery without angina pectoris: Secondary | ICD-10-CM | POA: Diagnosis not present

## 2016-10-10 ENCOUNTER — Telehealth: Payer: Self-pay | Admitting: Cardiology

## 2016-10-10 NOTE — Telephone Encounter (Signed)
°  Daughter wanting to give Dr. Meda Coffee an update on pt:  Pt has improved and will be discharged from rehab on Saturday 12/23. She will be staying with her son is [place]. During that time, she will be getting in home health care. Pt is on 40 mg of Lipitor daily. Daughter will contact our office after Christmas regarding records.

## 2016-10-10 NOTE — Telephone Encounter (Signed)
Left the daughter a detailed message on her VM thanking her for keeping [place] updated on the pt.  Left a message that we will update her med list that she is taking Lipitor 40 mg po daily, and we will be looking forward to hearing more progress on the pt and a call back in regards to receiving her records after Christmas.  Advised the daughter to call back with any additional information or questions regarding message left. Will forward this message to Dr Meda Coffee to make her aware of pts progress.

## 2016-10-11 ENCOUNTER — Telehealth: Payer: Self-pay | Admitting: Internal Medicine

## 2016-10-11 MED ORDER — HYDROCODONE-ACETAMINOPHEN 10-325 MG PO TABS: 1.0000 | ORAL_TABLET | Freq: Four times a day (QID) | ORAL | 0 refills | Status: DC | PRN

## 2016-10-11 NOTE — Telephone Encounter (Signed)
Pt's daughter requesting refill on Hydrocodone for Pt.   Last OV: 09/18/2016 Last Fill: 09/05/2016 #120 and 0RF (Pt sig: 1 tab q6h prn) UDS: 09/12/2016 Low risk  Please advise.

## 2016-10-11 NOTE — Telephone Encounter (Signed)
Rx printed, awaiting MD signature.  

## 2016-10-11 NOTE — Telephone Encounter (Signed)
Caller name: Relationship to patient: daughter Can be reached: [phone] Pharmacy:  Reason for call: Request refill on HYDROcodone-acetaminophen [facility]) 10-325 MG tablet [id number]

## 2016-10-11 NOTE — Telephone Encounter (Signed)
Okay #120, no refills

## 2016-10-11 NOTE — Telephone Encounter (Signed)
Please inform Pt that Rx has been placed at front desk for pick up at your convenience.

## 2016-10-12 ENCOUNTER — Ambulatory Visit: Payer: Medicare Other | Admitting: *Deleted

## 2016-10-17 DIAGNOSIS — I219 Acute myocardial infarction, unspecified: Secondary | ICD-10-CM | POA: Diagnosis not present

## 2016-10-17 DIAGNOSIS — M6281 Muscle weakness (generalized): Secondary | ICD-10-CM | POA: Diagnosis not present

## 2016-10-17 DIAGNOSIS — I10 Essential (primary) hypertension: Secondary | ICD-10-CM | POA: Diagnosis not present

## 2016-10-17 DIAGNOSIS — Z951 Presence of aortocoronary bypass graft: Secondary | ICD-10-CM | POA: Diagnosis not present

## 2016-10-17 DIAGNOSIS — E119 Type 2 diabetes mellitus without complications: Secondary | ICD-10-CM | POA: Diagnosis not present

## 2016-10-17 DIAGNOSIS — Z89511 Acquired absence of right leg below knee: Secondary | ICD-10-CM | POA: Diagnosis not present

## 2016-10-17 DIAGNOSIS — I2510 Atherosclerotic heart disease of native coronary artery without angina pectoris: Secondary | ICD-10-CM | POA: Diagnosis not present

## 2016-10-17 DIAGNOSIS — Z86711 Personal history of pulmonary embolism: Secondary | ICD-10-CM | POA: Diagnosis not present

## 2016-10-17 DIAGNOSIS — Z95828 Presence of other vascular implants and grafts: Secondary | ICD-10-CM | POA: Diagnosis not present

## 2016-10-19 DIAGNOSIS — I219 Acute myocardial infarction, unspecified: Secondary | ICD-10-CM | POA: Diagnosis not present

## 2016-10-19 DIAGNOSIS — I2510 Atherosclerotic heart disease of native coronary artery without angina pectoris: Secondary | ICD-10-CM | POA: Diagnosis not present

## 2016-10-19 DIAGNOSIS — M6281 Muscle weakness (generalized): Secondary | ICD-10-CM | POA: Diagnosis not present

## 2016-10-19 DIAGNOSIS — E119 Type 2 diabetes mellitus without complications: Secondary | ICD-10-CM | POA: Diagnosis not present

## 2016-10-19 DIAGNOSIS — I10 Essential (primary) hypertension: Secondary | ICD-10-CM | POA: Diagnosis not present

## 2016-10-19 DIAGNOSIS — Z951 Presence of aortocoronary bypass graft: Secondary | ICD-10-CM | POA: Diagnosis not present

## 2016-10-24 DIAGNOSIS — I10 Essential (primary) hypertension: Secondary | ICD-10-CM | POA: Diagnosis not present

## 2016-10-24 DIAGNOSIS — I219 Acute myocardial infarction, unspecified: Secondary | ICD-10-CM | POA: Diagnosis not present

## 2016-10-24 DIAGNOSIS — M6281 Muscle weakness (generalized): Secondary | ICD-10-CM | POA: Diagnosis not present

## 2016-10-24 DIAGNOSIS — Z951 Presence of aortocoronary bypass graft: Secondary | ICD-10-CM | POA: Diagnosis not present

## 2016-10-24 DIAGNOSIS — I2510 Atherosclerotic heart disease of native coronary artery without angina pectoris: Secondary | ICD-10-CM | POA: Diagnosis not present

## 2016-10-24 DIAGNOSIS — E119 Type 2 diabetes mellitus without complications: Secondary | ICD-10-CM | POA: Diagnosis not present

## 2016-10-27 DIAGNOSIS — I219 Acute myocardial infarction, unspecified: Secondary | ICD-10-CM | POA: Diagnosis not present

## 2016-10-27 DIAGNOSIS — Z951 Presence of aortocoronary bypass graft: Secondary | ICD-10-CM | POA: Diagnosis not present

## 2016-10-27 DIAGNOSIS — E119 Type 2 diabetes mellitus without complications: Secondary | ICD-10-CM | POA: Diagnosis not present

## 2016-10-27 DIAGNOSIS — I10 Essential (primary) hypertension: Secondary | ICD-10-CM | POA: Diagnosis not present

## 2016-10-27 DIAGNOSIS — I2510 Atherosclerotic heart disease of native coronary artery without angina pectoris: Secondary | ICD-10-CM | POA: Diagnosis not present

## 2016-10-27 DIAGNOSIS — M6281 Muscle weakness (generalized): Secondary | ICD-10-CM | POA: Diagnosis not present

## 2016-10-30 DIAGNOSIS — I10 Essential (primary) hypertension: Secondary | ICD-10-CM | POA: Diagnosis not present

## 2016-10-30 DIAGNOSIS — I219 Acute myocardial infarction, unspecified: Secondary | ICD-10-CM | POA: Diagnosis not present

## 2016-10-30 DIAGNOSIS — I2510 Atherosclerotic heart disease of native coronary artery without angina pectoris: Secondary | ICD-10-CM | POA: Diagnosis not present

## 2016-10-30 DIAGNOSIS — E119 Type 2 diabetes mellitus without complications: Secondary | ICD-10-CM | POA: Diagnosis not present

## 2016-10-30 DIAGNOSIS — M6281 Muscle weakness (generalized): Secondary | ICD-10-CM | POA: Diagnosis not present

## 2016-10-30 DIAGNOSIS — Z951 Presence of aortocoronary bypass graft: Secondary | ICD-10-CM | POA: Diagnosis not present

## 2016-10-31 DIAGNOSIS — E119 Type 2 diabetes mellitus without complications: Secondary | ICD-10-CM | POA: Diagnosis not present

## 2016-10-31 DIAGNOSIS — Z951 Presence of aortocoronary bypass graft: Secondary | ICD-10-CM | POA: Diagnosis not present

## 2016-10-31 DIAGNOSIS — M6281 Muscle weakness (generalized): Secondary | ICD-10-CM | POA: Diagnosis not present

## 2016-10-31 DIAGNOSIS — I2510 Atherosclerotic heart disease of native coronary artery without angina pectoris: Secondary | ICD-10-CM | POA: Diagnosis not present

## 2016-10-31 DIAGNOSIS — I10 Essential (primary) hypertension: Secondary | ICD-10-CM | POA: Diagnosis not present

## 2016-10-31 DIAGNOSIS — I219 Acute myocardial infarction, unspecified: Secondary | ICD-10-CM | POA: Diagnosis not present

## 2016-11-02 ENCOUNTER — Telehealth (INDEPENDENT_AMBULATORY_CARE_PROVIDER_SITE_OTHER): Payer: Self-pay | Admitting: Orthopedic Surgery

## 2016-11-02 DIAGNOSIS — I70203 Unspecified atherosclerosis of native arteries of extremities, bilateral legs: Secondary | ICD-10-CM | POA: Diagnosis not present

## 2016-11-03 ENCOUNTER — Telehealth (INDEPENDENT_AMBULATORY_CARE_PROVIDER_SITE_OTHER): Payer: Self-pay | Admitting: Orthopedic Surgery

## 2016-11-03 ENCOUNTER — Other Ambulatory Visit (INDEPENDENT_AMBULATORY_CARE_PROVIDER_SITE_OTHER): Payer: Self-pay

## 2016-11-03 DIAGNOSIS — I2510 Atherosclerotic heart disease of native coronary artery without angina pectoris: Secondary | ICD-10-CM | POA: Diagnosis not present

## 2016-11-03 DIAGNOSIS — I10 Essential (primary) hypertension: Secondary | ICD-10-CM | POA: Diagnosis not present

## 2016-11-03 DIAGNOSIS — E119 Type 2 diabetes mellitus without complications: Secondary | ICD-10-CM | POA: Diagnosis not present

## 2016-11-03 DIAGNOSIS — M6281 Muscle weakness (generalized): Secondary | ICD-10-CM | POA: Diagnosis not present

## 2016-11-03 DIAGNOSIS — I219 Acute myocardial infarction, unspecified: Secondary | ICD-10-CM | POA: Diagnosis not present

## 2016-11-03 DIAGNOSIS — Z951 Presence of aortocoronary bypass graft: Secondary | ICD-10-CM | POA: Diagnosis not present

## 2016-11-03 NOTE — Telephone Encounter (Signed)
Patient called asked if the Rx for her brace and shoes be can be faxed over to [facility]. [place]: [facility]   The fax # is [phone]

## 2016-11-03 NOTE — Telephone Encounter (Signed)
An order for a double upright brace and extra depth shoe has been faxed to the number provided by the pt

## 2016-11-03 NOTE — Progress Notes (Signed)
Documentation was faxed to prosthetic provider as requested by the pt.

## 2016-11-06 DIAGNOSIS — M6281 Muscle weakness (generalized): Secondary | ICD-10-CM | POA: Diagnosis not present

## 2016-11-06 DIAGNOSIS — I10 Essential (primary) hypertension: Secondary | ICD-10-CM | POA: Diagnosis not present

## 2016-11-06 DIAGNOSIS — I219 Acute myocardial infarction, unspecified: Secondary | ICD-10-CM | POA: Diagnosis not present

## 2016-11-06 DIAGNOSIS — E782 Mixed hyperlipidemia: Secondary | ICD-10-CM | POA: Diagnosis not present

## 2016-11-06 DIAGNOSIS — E119 Type 2 diabetes mellitus without complications: Secondary | ICD-10-CM | POA: Diagnosis not present

## 2016-11-06 DIAGNOSIS — Z951 Presence of aortocoronary bypass graft: Secondary | ICD-10-CM | POA: Diagnosis not present

## 2016-11-06 DIAGNOSIS — I25728 Atherosclerosis of autologous artery coronary artery bypass graft(s) with other forms of angina pectoris: Secondary | ICD-10-CM | POA: Diagnosis not present

## 2016-11-06 DIAGNOSIS — I2510 Atherosclerotic heart disease of native coronary artery without angina pectoris: Secondary | ICD-10-CM | POA: Diagnosis not present

## 2016-11-10 DIAGNOSIS — E119 Type 2 diabetes mellitus without complications: Secondary | ICD-10-CM | POA: Diagnosis not present

## 2016-11-10 DIAGNOSIS — M6281 Muscle weakness (generalized): Secondary | ICD-10-CM | POA: Diagnosis not present

## 2016-11-10 DIAGNOSIS — I10 Essential (primary) hypertension: Secondary | ICD-10-CM | POA: Diagnosis not present

## 2016-11-10 DIAGNOSIS — Z951 Presence of aortocoronary bypass graft: Secondary | ICD-10-CM | POA: Diagnosis not present

## 2016-11-10 DIAGNOSIS — I2510 Atherosclerotic heart disease of native coronary artery without angina pectoris: Secondary | ICD-10-CM | POA: Diagnosis not present

## 2016-11-10 DIAGNOSIS — I219 Acute myocardial infarction, unspecified: Secondary | ICD-10-CM | POA: Diagnosis not present

## 2016-11-13 ENCOUNTER — Encounter: Payer: Self-pay | Admitting: Neurology

## 2016-11-13 ENCOUNTER — Ambulatory Visit (INDEPENDENT_AMBULATORY_CARE_PROVIDER_SITE_OTHER): Payer: Medicare Other | Admitting: Neurology

## 2016-11-13 VITALS — BP 125/73 | HR 79 | Ht 63.0 in

## 2016-11-13 DIAGNOSIS — H8110 Benign paroxysmal vertigo, unspecified ear: Secondary | ICD-10-CM

## 2016-11-13 DIAGNOSIS — G629 Polyneuropathy, unspecified: Secondary | ICD-10-CM | POA: Diagnosis not present

## 2016-11-13 NOTE — Progress Notes (Signed)
[name]    Provider:  Dr Jaynee Eagles Referring Provider: Colon Branch, MD Primary Care Physician:  [name], MD  Interval history 11/13/2016:  She returns for dizziness likely BPPV. I have recommended vestibular therapy but she has not gone. She has had open heart surgery since we last saw her. She is living with her son. She was feeling chest pain and burping around Thanksgiving in [place]. She is having some headache pain on the right occipital area and into the neck on the right into the muscular area which is new and maybe she slept on it wrong. Otherwise headaches resolved. She says she has not had any dizziness since last appointment. Dr. Ernesto Rutherford, ENT, evaluated her which was normal but did say likely BPPV. she takes meclizine and zofran when she gets into  he car but she is much improved. She has been shooting pain into her legs every once in a while which is not significant.     HISTORY OF PRESENT ILLNESS: [name] is a 82 year old female with a history of dizziness. She returns today for follow-up. She states that her dizziness has improved. She did have an appointment with Dr. Claria Dice and reports that there exam was unremarkable. She states that she takes the Zofran and meclizine before getting in the car. She states that she was able to come from [place] last night without having an episode of dizziness. She states approximately 2 weeks ago she felt as if the dizziness will start but then it resolved. The patient did have a below the knee amputation on the right in April. She is in the process of getting a prosthesis and learning to walk. She is currently residing in [place] with her son until she can ambulate independently. She returns today for an evaluation.  Addendum; Saw Dr. Ernesto Rutherford, no otologic issues, likely BPPV. 03/2016  03/15/16 (Brya Simerly): She is having episodes of getting very sick. She starts having motion sickness. Lots of nausea. She  has vertigo. Feels like her head is spinning. Worse when she moves her head. She gets the vertigo when she moves her head. She feels like she has to sit very still and not move her head or the vertigo will come back. This is not something new. It has become more severe. These episodes have been Gould on for at least a year and she also had in the past. It happens in the afternoons. Sometimes after she eats. She has decreased hearing. Has not seen anyone fore   HPI:[name] is a 82 y.o. female here as a referral from Dr. Larose Kells for headaches., PMHx spinal stenosis, HTN, neuropathy, CAD, DM2, arthritis, RSD, fatty liver. Patient fell on her steps while helping with groceries and she was flat on her back. She uses a walker and wasn't using it, helping lift groceries. Her head hit the bricks. She did not go to the hospital. This happened mid December. She followed up on her back and neck and all was good. She hasn't felt right since then, a few weeks ago she was in the bathroom and all of a sudden she saw lights and stars blinking on the right side, went on for 30 minutes. It has happened a few times, happened less since them. She saw Dr. Bing Plume and they could not see her so she went to Dr. Katy Fitch. She has a history of migraines when she was a teenager and lasted into her thirties. Sister with very bad migraines. After  the lights stop she gets a headache. No double vision, no blurry vision, but she does see visual abnormalities like something running across her vision, she is having more headaches. She is also having dizziness. She has been more irritable since hitting her head, worsening memory problems. She is having blurry vision. She takes hydrocodone daily. She takes advil sometimes not too often. They don't last that long, not that severe 5/10 for 20 minutes.   Reviewed notes, labs and imaging from outside physicians, which showed; She was seen by Dr Katy Fitch for an emergency visit. Her head had been hurting  since a fall. She had a "light show" in her right eye with some vision changes. VA was od 20/30, os 20/25, external exam normal, slit lamp nml, fundus nml, glaucoma borderline. He suspected migraine aura. Visual fields were performed. .  crp 4.2, esr 2.   REVIEW OF SYSTEMS: Out of a complete 14 system review of symptoms, the patient complains only of the following symptoms, and all other reviewed systems are negative.  Hearing loss, daytime sleepiness, memory loss, headache   Social History   Social History  . Marital status: Divorced    Spouse name: N/A  . Number of children: 3  . Years of education: 15   Occupational History  . Retired      from [employer]    Social History Main Topics  . Smoking status: Never Smoker  . Smokeless tobacco: Never Used     Comment: tried for a few months in college.   . Alcohol use No  . Drug use: No  . Sexual activity: No   Other Topics Concern  . Not on file   Social History Narrative   Lives: daughter lives w/ her and another daughter lives in town   1 son in [place]   Son comes home every 4 weeks to visit   Caffeine use:  Tea 1/day w/ dinner   Coffee occass              Family History  Problem Relation Age of Onset  . Heart disease Mother     mitral valve replaced  . Arthritis Mother   . Diabetic kidney disease Daughter   . Diabetes Father   . Stroke Father   . Migraines Sister   . Hypertension    . Breast cancer    . Headache Sister   . Colon cancer Neg Hx   . Esophageal cancer Neg Hx   . Stomach cancer Neg Hx   . Rectal cancer Neg Hx     Past Medical History:  Diagnosis Date  . Abnormal echocardiogram    09/2013: mild LVH, mild focal basal hypertrophy of septum, EF 60-65%, normal WM, grade 1 diastolic dysfunction, MAC, mild LAE, ASA, PASP 34. Possible oscillating MV density - reviewed by MD and felt there was no significant abnormality other than MAC noted with mitral valve and did not require TEE.  .  Adenomatous colon polyp   . Allergic rhinitis   . Arthritis    "back; ankles; hands; knees" (10/31/2013)  . Bilateral sensorineural hearing loss   . CAD (coronary artery disease)    a. Nonobst in 2011. b. Abnormal nuc 09/2013 -> s/p cutting balloon to D2, mild LAD disease; c. 08/2015 MV: no ischemia, EF 71%.  . Cancer ([place])    cancerous polyps  . Carcinoma in situ in a polyp 1994   a. 1994 - malignant polyp removed during colonoscopy.  . Charcot's  Foot    a.  01/2016 s/p RLE transtibial amputation 2/2 Charcot rocker-bottom deformity and insensate neuropathy ulceration.  . Chronic diastolic CHF (congestive heart failure) ([place])    a. 09/2013 Echo: EF 60-65%, mild LVH, Gr1 DD.  Marland Kitchen Diverticulosis   . DJD (degenerative joint disease)   . DVT (deep venous thrombosis) ([place]) 12/2007  . Dyspnea    a. Chronic, extensive w/u see OV note 09-2010. b. [place] 2011: ormal with RA 5 RV 31/2 PA 27/12 (19) PCW 10 CO normal. No evidence of shunting with sitting up in cath lab. c. CPX 2011: see report. d. Prior fluoro of diaphragm -  R diaphragm elevated at rest but both moved with inspiration.   . Fatty liver   . GERD (gastroesophageal reflux disease)    a. Hx GERD/esophageal dysmotility followed by Dr. Olevia Perches.   Marland Kitchen Headache    Optic migraine  . Hemorrhoids   . Hiatal hernia    a. s/p Nissen fundoplication 7253.  Marland Kitchen Hyperlipidemia    a. patient unwilling to use statins.  . Hypertensive heart disease   . IBS (irritable bowel syndrome)   . Macular degeneration 03/2009   Dr. Rosana Hoes  . Macular degeneration   . Neuropathy ([place])    a. Hands, feet, legs.  . Orthostatic hypotension   . Osteomyelitis ([place])    a. 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 01/2016 s/p RLE transtibial amputation 2/2 Charcot rocker-bottom deformity and insensate neuropathy ulceration.  . Osteoporosis   . PE (pulmonary embolism) 12/2007   a. PE/DVT after neck surgery 2009. b.  coumadin d/c 10-2008.  Marland Kitchen Pneumonia   . PONV (postoperative nausea and vomiting) 2009   neck surgery  . PPD positive   . Pulmonary nodule    incidental per CT:  Pet scan 4-9: likely benign, CT 08-2009 no change, no further CTs (Dr. Gwenette Greet)  . Recurrent UTI   . RSD (reflex sympathetic dystrophy)    a. Chronic pain.  Marland Kitchen Spinal stenosis   . Type II diabetes mellitus ([place])   . Venous insufficiency    a. Contributing to LEE.    Past Surgical History:  Procedure Laterality Date  . AMPUTATION  03/06/2012   Procedure: AMPUTATION FOOT;  Surgeon: Newt Minion, MD;  Location: [facility];  Service: Orthopedics;  Laterality: Left;  FIFTH RAY AMPUTATION   . AMPUTATION Right 02/18/2016   Procedure: AMPUTATION BELOW KNEE;  Surgeon: Newt Minion, MD;  Location: [facility];  Service: Orthopedics;  Laterality: Right;  . ANKLE FUSION  09/27/2012   Procedure: ANKLE FUSION;  Surgeon: Newt Minion, MD;  Location: [facility];  Service: Orthopedics;  Laterality: Left;  Left Tibiocalcaneal Fusion  . ANKLE FUSION Right 05/09/2013   Procedure: ANKLE FUSION;  Surgeon: Newt Minion, MD;  Location: [facility];  Service: Orthopedics;  Laterality: Right;  Excision Osteomyelitis Base 1st MT Right Foot, Fusion Medial Column  . ANKLE SURGERY Left apr & june 1991  . ANTERIOR CERVICAL DECOMP/DISCECTOMY FUSION  12/18/07   For OA,  Dr. Lorin Mercy:  fu by a PE  . CARDIAC CATHETERIZATION  05/2010    at [facility]  . CARPAL TUNNEL RELEASE Left 09/2011  . CATARACT EXTRACTION W/ INTRAOCULAR LENS  IMPLANT, BILATERAL  2004   feb 2004 left, aug 2004 right  . CHOLECYSTECTOMY  03/2002  . COLONOSCOPY     numerous times  . CORONARY ANGIOPLASTY  10/31/2013  . CYST REMOVAL HAND  06/2003  . FOOT BONE EXCISION Right 06/2009  . LAPAROSCOPIC RIGHT HEMI COLECTOMY N/A 01/08/2015   Procedure: LAPAROSCOPIC ASSISTED RIGHT HEMI COLECTOMY;  Surgeon: Johnathan Hausen, MD;  Location: [facility];  Service: General;  Laterality: N/A;  . LEFT HEART CATHETERIZATION WITH CORONARY  ANGIOGRAM N/A 10/31/2013   Procedure: LEFT HEART CATHETERIZATION WITH CORONARY ANGIOGRAM;  Surgeon: Jettie Booze, MD;  Location: [facility] CATH [facility];  Service: Cardiovascular;  Laterality: N/A;  . NASAL SEPTUM SURGERY  09/1963  . NISSEN FUNDOPLICATION  11/26/4008  . PERCUTANEOUS CORONARY INTERVENTION-BALLOON ONLY  10/31/2013   Procedure: PERCUTANEOUS CORONARY INTERVENTION-BALLOON ONLY;  Surgeon: Jettie Booze, MD;  Location: [facility] C[facility];  Service: Cardiovascular;;  . ROTATOR CUFF REPAIR Right 07/2007   Dr. Percell Miller  . ROTATOR CUFF REPAIR Left 11/2004  . TOE AMPUTATION Right 08/2008   3rd toe, Dr. Sharol Given due to osteomyelitis  . VAGINAL HYSTERECTOMY  03/1975  . VEIN LIGATION Bilateral 03/1966  . VENA CAVA FILTER PLACEMENT  01/2010   green filter; "due to blood clots"  . WISDOM TOOTH EXTRACTION  06/2007    Current Outpatient Prescriptions  Medication Sig Dispense Refill  . Alpha-Lipoic Acid 600 MG CAPS Take 600 mg by mouth 2 (two) times daily.    Marland Kitchen aspirin 81 MG tablet Take 81 mg by mouth daily.    Marland Kitchen atorvastatin (LIPITOR) 40 MG tablet Take 40 mg by mouth daily.    . carvedilol (COREG) 25 MG tablet Take 25 mg by mouth 2 (two) times daily with a meal.    . Cholecalciferol (VITAMIN D3) 5000 UNITS CAPS Take 5,000 Units by mouth every evening. Reported on 11/24/2015    . Cinnamon 500 MG TABS Take 1 tablet by mouth 2 (two) times daily.    . clopidogrel (PLAVIX) 75 MG tablet Take 75 mg by mouth daily.    . Cyanocobalamin (VITAMIN B-12) 2500 MCG SUBL Place 2,500 mcg under the tongue every evening. Reported on 11/24/2015    . diclofenac sodium (VOLTAREN) 1 % GEL Apply 1 application topically daily as needed (pain).    . furosemide (LASIX) 40 MG tablet Take 40 mg by mouth daily as needed.    . Lancets (FREESTYLE) lancets Check blood sugar twice daily 100 each 12  . lisinopril (PRINIVIL,ZESTRIL) 5 MG tablet Take 5 mg by mouth daily.    . meclizine (ANTIVERT) 25 MG tablet Take 1 tablet (25 mg total) by mouth 2  (two) times daily as needed for dizziness. 180 tablet 1  . Multiple Vitamins-Minerals (PRESERVISION AREDS 2) CAPS Take 1 capsule by mouth 2 (two) times daily.    . nabumetone (RELAFEN) 750 MG tablet Take 750 mg by mouth 2 (two) times daily.    . nitroGLYCERIN (NITROSTAT) 0.4 MG SL tablet Place 0.4 mg under the tongue every 5 (five) minutes as needed for chest pain (x 3 doses). Reported on 04/14/2016    . omeprazole (PRILOSEC) 20 MG capsule Take 1 capsule (20 mg total) by mouth 2 (two) times daily. 180 capsule 3  . ondansetron (ZOFRAN ODT) 4 MG disintegrating tablet Take 1 tablet (4 mg total) by mouth every 8 (eight) hours as needed for nausea or vomiting. 60 tablet 11  . OVER THE COUNTER MEDICATION Take 1 capsule by mouth 2 (two) times daily. Integrative Digestive Formula    . OVER THE COUNTER MEDICATION 3 (three) times daily. Berberorine Gluco Defense    . Polyvinyl Alcohol-Povidone (REFRESH OP) Place 1 drop into both eyes daily as needed (dry eyes).    Marland Kitchen  potassium chloride (K-DUR) 10 MEQ tablet Take 10 mEq by mouth daily. Filled 10/13/16. Take  2 tablets daily for 10 days    . metoprolol succinate (TOPROL XL) 25 MG 24 hr tablet Take 25 mg by mouth daily.     No current facility-administered medications for this visit.     Allergies as of 11/13/2016 - Review Complete 11/13/2016  Allergen Reaction Noted  . Cymbalta [duloxetine hcl] Swelling 09/25/2012  . Gabapentin Swelling 09/25/2012  . Silicone Hives, Itching, Dermatitis, and Rash   . Metformin and related Other (See Comments) 12/10/2015  . Adhesive [tape] Rash 07/17/2011  . Buprenorphine hcl  10/31/2013  . Cefazolin Hives 03/06/2012  . Ciprofloxacin Other (See Comments) 01/31/2013  . Clorazepate dipotassium Other (See Comments)   . Darifenacin  03/30/2009  . Darifenacin hydrobromide Other (See Comments) 03/30/2009  . Dilaudid [hydromorphone hcl] Other (See Comments) 03/11/2010  . Doxycycline Rash 11/03/2008  . Enablex [darifenacin  hydrobromide er] Other (See Comments) 03/06/2012  . Levofloxacin Other (See Comments) 01/08/2014  . Lyrica [pregabalin] Swelling 05/08/2013  . Methadone hcl Other (See Comments)   . Morphine and related Other (See Comments) 10/31/2013  . Pentazocine lactate Other (See Comments) 07/17/2011  . Talwin [pentazocine] Other (See Comments) 12/04/2012    Vitals: BP 125/73 (BP Location: Left Arm, Patient Position: Sitting, Cuff Size: Normal)   Pulse 79   Ht _0  (1.6 m)  Last Weight:  Wt Readings from Last 1 Encounters:  09/18/16 170 lb (77.1 kg)   Last Height:   Ht Readings from Last 1 Encounters:  11/13/16 _1  (1.6 m)   Physical exam: Exam: Gen: NAD, conversant, well nourised, obese, well groomed                     CV: RRR, no MRG. No Carotid Bruits.  Eyes: Conjunctivae clear without exudates or hemorrhage  Neuro: Detailed Neurologic Exam  Speech:    Speech is normal; fluent and spontaneous with normal comprehension.  Cognition:    The patient is oriented to person, place, and time;     recent and remote memory intact;     language fluent;     normal attention, concentration,     fund of knowledge Cranial Nerves:    The pupils are equal, round, and reactive to light. The fundi are normal and spontaneous venous pulsations are present. Visual fields are full to finger confrontation. Extraocular movements are intact. Trigeminal sensation is intact and the muscles of mastication are normal. The face is symmetric. The palate elevates in the midline. Hearing intact. Voice is normal. Shoulder shrug is normal. The tongue has normal motion without fasciculations.   Coordination:    No dysmetria  Gait:    With a walker, slow and mildly antalgic due to right leg RSD with brace  Motor Observation:    No asymmetry, no atrophy, and no involuntary movements noted.   Strength:    Left leg in a brace but strength otherwise strength intact       Sensation: intact to LT       Reflex Exam:  DTR's: cannot test left LL due to RSD but otherwise deep tendon reflexes in the upper extremities brisk and right lower extremity is absent   Toes:    The toes are equiv bilaterally.   Clonus:    Clonus is absent.     Assessment/Plan: This is a very pleasant 82 year old female here for vertigo appears to be BPPV. MRI  of the brain in 11/2015 did not show any stroke or other etiology for her symptoms. Hearing loss, vertigo. She is much improved, no significant dizziness or headache since last being seen. Discussed vestibular therapy should the symptoms return.   Meclizine and zofran prn ENT evaluation - Dr. Ernesto Rutherford thinks likely BPPV Vestibular therapy - Have recommended multiple times, if symptoms worsen or recur she will call For her neuropathy in the legs, it is occasional and not significant per patient and I hesitate to add anymore medication to her extensive list, she is fine with this, discussed neuropathy Return as needed   Sarina Ill, MD  [facility] [address] [place], [place] [address]  Phone [phone] Fax [phone]  A total of 30 minutes was spent face-to-face with this patient. Over half this time was spent on counseling patient on the BPPV diagnosis and different diagnostic and therapeutic options available.

## 2016-11-13 NOTE — Patient Instructions (Signed)
Remember to drink plenty of fluid, eat healthy meals and do not skip any meals. Try to eat protein with a every meal and eat a healthy snack such as fruit or nuts in between meals. Try to keep a regular sleep-wake schedule and try to exercise daily, particularly in the form of walking, 20-30 minutes a day, if you can.   As far as your medications are concerned, I would like to suggest: Zofran and meclizine as needed  I would like to see you back as needed, sooner if we need to. Please call us with any interim questions, concerns, problems, updates or refill requests.   Our phone number is [phone]. We also have an after hours call service for urgent matters and there is a physician on-call for urgent questions. For any emergencies you know to call 911 or go to the nearest emergency room

## 2016-11-14 ENCOUNTER — Encounter (INDEPENDENT_AMBULATORY_CARE_PROVIDER_SITE_OTHER): Payer: Self-pay | Admitting: Orthopedic Surgery

## 2016-11-14 ENCOUNTER — Ambulatory Visit (INDEPENDENT_AMBULATORY_CARE_PROVIDER_SITE_OTHER): Payer: Medicare Other | Admitting: Orthopedic Surgery

## 2016-11-14 DIAGNOSIS — Z89511 Acquired absence of right leg below knee: Secondary | ICD-10-CM

## 2016-11-14 DIAGNOSIS — M869 Osteomyelitis, unspecified: Secondary | ICD-10-CM

## 2016-11-14 NOTE — Progress Notes (Signed)
Office Visit Note   Patient: [name]           Date of Birth: 1935-03-22           MRN: [id number] Visit Date: 11/14/2016              Requested by: Colon Branch, MD [address] [place], [place] [address] PCP: [name], MD  Chief Complaint  Patient presents with  . Right Knee - Follow-up    R BKA    HPI: Patient presents today for prosthetic evaluation for right below knee amputation.  Patient presents with acute ulceration left foot fourth toe status post cardiac event with intervention. Patient states she's ready for prosthesis on the right.  Assessment & Plan: Visit Diagnoses:  1. Osteomyelitis of toe of left foot ([place])   2. Status post below knee amputation of right lower extremity ([place])     Plan: Discussed with patient and we will need to proceed with amputation of fourth toe left foot due to the osteomyelitis. Would be surgery as outpatient with a local regional block and she would be able to be discharged after surgery. Patient was given prescriptions for a K2 level prosthesis for the right recommend she get a biotech to get another stump shrinker. Patient will also need extra depth shoes and double upright brace for the left foot. Custom orthotics.  Follow-Up Instructions: Return if symptoms worsen or fail to improve.   Ortho Exam Examination patient is alert oriented no adenopathy well-dressed normal affect him rest ref which she is status post a cardiac event while in [place]. Patient states she is doing better now but having trouble with her memory. Examination the left foot she has an ulcer with exposed bone with osteomyelitis of the PIP joint of the left fourth toe with cellulitis of the toe with no ascending cellulitis. Right transtibial amputation is well consolidated well healed.  Imaging: No results found.  Orders:  No orders of the defined types were placed in this encounter.  No orders of the defined types were placed in this  encounter.    Procedures: No procedures performed  Clinical Data: No additional findings.  Subjective: Review of Systems  Objective: Vital Signs: There were no vitals taken for this visit.  Specialty Comments:  No specialty comments available.  PMFS History: Patient Active Problem List   Diagnosis Date Noted  . Hypertensive heart disease   . Chronic diastolic CHF (congestive heart failure) ([place])   . Acute on chronic diastolic CHF (congestive heart failure), NYHA class 1 ([place]) 03/30/2016  . Acute on chronic diastolic CHF (congestive heart failure), NYHA class 3 ([place]) 03/30/2016  . Chest pain 03/30/2016  . Coronary artery disease due to lipid rich plaque 03/30/2016  . Pain in the chest 03/30/2016  . Statin intolerance 03/30/2016  . Acute renal failure superimposed on stage 3 chronic kidney disease ([place]) 03/30/2016  . S/P below knee amputation ([place]) 02/18/2016  . Head trauma 11/26/2015  . Memory loss 11/26/2015  . PCP NOTES >>>>>>>>>>>>>>>>>>>> 10/18/2015  . DOE (dyspnea on exertion) 09/01/2015  . S/P partial colectomy 01/08/2015  . DDD (degenerative disc disease), lumbar 06/02/2014  . 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 Colitis 01/28/2014  . Anemia 01/18/2014  . Nonspecific abnormal unspecified cardiovascular function study 10/31/2013  . Rectal bleeding 10/31/2013  . Hemorrhoids 10/31/2013  . CAD (coronary artery disease)   . Carcinoma in situ in a polyp   .  Depression 09/17/2013  . Charcot's joint of right foot S/P Reconstruction 08/21/2013  . Osteoarthritis 08/21/2013  . Foot osteomyelitis, right ([place]) 07/16/2013  . Extrinsic asthma, unspecified 07/16/2013  . Chronic pain associated with significant psychosocial dysfunction 05/06/2013  . Lumbar and sacral osteoarthritis 04/22/2013  . GERD (gastroesophageal reflux disease)   . Neuropathy ([place])   . Hyperthyroidism   . Lower extremity edema 03/08/2011  . Annual physical exam  03/08/2011  . ALLERGIC RHINITIS 04/15/2010  . ORTHOSTATIC DIZZINESS 04/13/2010  . HIATAL HERNIA 12/06/2009  . GASTRIC ULCER, HX OF 12/06/2009  . DYSPNEA 03/18/2009  . Urinary tract infection 07/07/2008  . DEGENERATIVE JOINT DISEASE 06/03/2008  . COLONIC POLYPS 01/27/2008  . DIVERTICULOSIS, COLON 01/27/2008  . IRRITABLE BOWEL SYNDROME, HX OF 01/27/2008  . PULMONARY NODULE 01/22/2008  . Hyperlipidemia 12/30/2007  . Osteoporosis 12/30/2007  . TB SKIN TEST, POSITIVE 08/09/2007  . DM II (diabetes mellitus, type II), controlled ([place]) 05/09/2007  . Reflex sympathetic dystrophy-- PAIN MNGMT  05/09/2007  . Essential hypertension 04/15/2007  . GERD 04/15/2007   Past Medical History:  Diagnosis Date  . Abnormal echocardiogram    09/2013: mild LVH, mild focal basal hypertrophy of septum, EF 60-65%, normal WM, grade 1 diastolic dysfunction, MAC, mild LAE, ASA, PASP 34. Possible oscillating MV density - reviewed by MD and felt there was no significant abnormality other than MAC noted with mitral valve and did not require TEE.  . Adenomatous colon polyp   . Allergic rhinitis   . Arthritis    "back; ankles; hands; knees" (10/31/2013)  . Bilateral sensorineural hearing loss   . CAD (coronary artery disease)    a. Nonobst in 2011. b. Abnormal nuc 09/2013 -> s/p cutting balloon to D2, mild LAD disease; c. 08/2015 MV: no ischemia, EF 71%.  . Cancer ([place])    cancerous polyps  . Carcinoma in situ in a polyp 1994   a. 1994 - malignant polyp removed during colonoscopy.  . Charcot's Foot    a.  01/2016 s/p RLE transtibial amputation 2/2 Charcot rocker-bottom deformity and insensate neuropathy ulceration.  . Chronic diastolic CHF (congestive heart failure) ([place])    a. 09/2013 Echo: EF 60-65%, mild LVH, Gr1 DD.  Marland Kitchen Diverticulosis   . DJD (degenerative joint disease)   . DVT (deep venous thrombosis) ([place]) 12/2007  . Dyspnea    a. Chronic, extensive w/u see OV note 09-2010. b. [place] 2011: ormal with RA 5 RV  31/2 PA 27/12 (19) PCW 10 CO normal. No evidence of shunting with sitting up in cath lab. c. CPX 2011: see report. d. Prior fluoro of diaphragm -  R diaphragm elevated at rest but both moved with inspiration.   . Fatty liver   . GERD (gastroesophageal reflux disease)    a. Hx GERD/esophageal dysmotility followed by Dr. Olevia Perches.   Marland Kitchen Headache    Optic migraine  . Hemorrhoids   . Hiatal hernia    a. s/p Nissen fundoplication 3329.  Marland Kitchen Hyperlipidemia    a. patient unwilling to use statins.  . Hypertensive heart disease   . IBS (irritable bowel syndrome)   . Macular degeneration 03/2009   Dr. Rosana Hoes  . Macular degeneration   . Neuropathy ([place])    a. Hands, feet, legs.  . Orthostatic hypotension   . Osteomyelitis ([place])    a. 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 01/2016 s/p RLE transtibial amputation 2/2 Charcot rocker-bottom deformity and insensate neuropathy ulceration.  Marland Kitchen  Osteoporosis   . PE (pulmonary embolism) 12/2007   a. PE/DVT after neck surgery 2009. b. coumadin d/c 10-2008.  Marland Kitchen Pneumonia   . PONV (postoperative nausea and vomiting) 2009   neck surgery  . PPD positive   . Pulmonary nodule    incidental per CT:  Pet scan 4-9: likely benign, CT 08-2009 no change, no further CTs (Dr. Gwenette Greet)  . Recurrent UTI   . RSD (reflex sympathetic dystrophy)    a. Chronic pain.  Marland Kitchen Spinal stenosis   . Type II diabetes mellitus ([place])   . Venous insufficiency    a. Contributing to LEE.    Family History  Problem Relation Age of Onset  . Heart disease Mother     mitral valve replaced  . Arthritis Mother   . Diabetic kidney disease Daughter   . Diabetes Father   . Stroke Father   . Migraines Sister   . Hypertension    . Breast cancer    . Headache Sister   . Colon cancer Neg Hx   . Esophageal cancer Neg Hx   . Stomach cancer Neg Hx   . Rectal cancer Neg Hx     Past Surgical History:  Procedure Laterality Date  . AMPUTATION   03/06/2012   Procedure: AMPUTATION FOOT;  Surgeon: Newt Minion, MD;  Location: [facility];  Service: Orthopedics;  Laterality: Left;  FIFTH RAY AMPUTATION   . AMPUTATION Right 02/18/2016   Procedure: AMPUTATION BELOW KNEE;  Surgeon: Newt Minion, MD;  Location: [facility];  Service: Orthopedics;  Laterality: Right;  . ANKLE FUSION  09/27/2012   Procedure: ANKLE FUSION;  Surgeon: Newt Minion, MD;  Location: [facility];  Service: Orthopedics;  Laterality: Left;  Left Tibiocalcaneal Fusion  . ANKLE FUSION Right 05/09/2013   Procedure: ANKLE FUSION;  Surgeon: Newt Minion, MD;  Location: [facility];  Service: Orthopedics;  Laterality: Right;  Excision Osteomyelitis Base 1st MT Right Foot, Fusion Medial Column  . ANKLE SURGERY Left apr & june 1991  . ANTERIOR CERVICAL DECOMP/DISCECTOMY FUSION  12/18/07   For OA,  Dr. Lorin Mercy:  fu by a PE  . CARDIAC CATHETERIZATION  05/2010    at [facility]  . CARPAL TUNNEL RELEASE Left 09/2011  . CATARACT EXTRACTION W/ INTRAOCULAR LENS  IMPLANT, BILATERAL  2004   feb 2004 left, aug 2004 right  . CHOLECYSTECTOMY  03/2002  . COLONOSCOPY     numerous times  . CORONARY ANGIOPLASTY  10/31/2013  . CYST REMOVAL HAND  06/2003  . FOOT BONE EXCISION Right 06/2009  . LAPAROSCOPIC RIGHT HEMI COLECTOMY N/A 01/08/2015   Procedure: LAPAROSCOPIC ASSISTED RIGHT HEMI COLECTOMY;  Surgeon: Johnathan Hausen, MD;  Location: [facility];  Service: General;  Laterality: N/A;  . LEFT HEART CATHETERIZATION WITH CORONARY ANGIOGRAM N/A 10/31/2013   Procedure: LEFT HEART CATHETERIZATION WITH CORONARY ANGIOGRAM;  Surgeon: Jettie Booze, MD;  Location: [facility] C[facility] LAB;  Service: Cardiovascular;  Laterality: N/A;  . NASAL SEPTUM SURGERY  09/1963  . NISSEN FUNDOPLICATION  12/29/1015  . PERCUTANEOUS CORONARY INTERVENTION-BALLOON ONLY  10/31/2013   Procedure: PERCUTANEOUS CORONARY INTERVENTION-BALLOON ONLY;  Surgeon: Jettie Booze, MD;  Location: [facility] CATH LAB;  Service: Cardiovascular;;  . ROTATOR CUFF REPAIR Right 07/2007    Dr. Percell Miller  . ROTATOR CUFF REPAIR Left 11/2004  . TOE AMPUTATION Right 08/2008   3rd toe, Dr. Sharol Given due to osteomyelitis  . VAGINAL HYSTERECTOMY  03/1975  . VEIN LIGATION Bilateral 03/1966  . VENA CAVA  FILTER PLACEMENT  01/2010   green filter; "due to blood clots"  . WISDOM TOOTH EXTRACTION  06/2007   Social History   Occupational History  . Retired      from [employer]    Social History Main Topics  . Smoking status: Never Smoker  . Smokeless tobacco: Never Used     Comment: tried for a few months in college.   . Alcohol use No  . Drug use: No  . Sexual activity: No

## 2016-11-15 ENCOUNTER — Ambulatory Visit: Payer: Medicare Other | Admitting: Gastroenterology

## 2016-11-15 ENCOUNTER — Telehealth: Payer: Self-pay | Admitting: Neurology

## 2016-11-15 ENCOUNTER — Telehealth (INDEPENDENT_AMBULATORY_CARE_PROVIDER_SITE_OTHER): Payer: Self-pay | Admitting: Orthopedic Surgery

## 2016-11-15 ENCOUNTER — Telehealth: Payer: Self-pay | Admitting: Cardiology

## 2016-11-15 NOTE — Telephone Encounter (Signed)
[name], clinical) is calling to give  You an update on [name]  . Please call .Marland Kitchen Thanks

## 2016-11-15 NOTE — Telephone Encounter (Signed)
Daughter wanted to call and give an update on her mother. The patient is currently still living with her son in [place] and is seeing a cardiologist there. The daughter states that the cardiologist there is pleased with her progress. She continues to receive in home health care and PT. She states that the cardiologist was going to send a copy of her records and wanted [place] to let her know if we did not receive it. Message forwarded to [facility], RN and Dr. Meda Coffee.

## 2016-11-15 NOTE — Telephone Encounter (Signed)
Noted, will wait to get fax number and will have [facility] resend referral to requested facility

## 2016-11-15 NOTE — Telephone Encounter (Signed)
That's so great to hear! Please send her our greetings!

## 2016-11-15 NOTE — Telephone Encounter (Signed)
Pt's daughter called said the pt is staying with her son in [place], [place]. Said the pt will be here until Monday or Tuesday and she wanted to send the PT orders with her brother. She said she will call back this afternoon to give the fax number for [facility] if that would be easier.

## 2016-11-15 NOTE — Telephone Encounter (Signed)
Daughter calling asking about the patient's post operative care.  What will be patient's weight bearing status on the L foot after the toe amputation?  The in home physical therapist in [facility] needs to know this ASAP.

## 2016-11-16 ENCOUNTER — Other Ambulatory Visit (INDEPENDENT_AMBULATORY_CARE_PROVIDER_SITE_OTHER): Payer: Self-pay | Admitting: Family

## 2016-11-16 ENCOUNTER — Other Ambulatory Visit: Payer: Self-pay | Admitting: Neurology

## 2016-11-16 ENCOUNTER — Other Ambulatory Visit: Payer: Self-pay

## 2016-11-16 ENCOUNTER — Telehealth: Payer: Self-pay | Admitting: Internal Medicine

## 2016-11-16 ENCOUNTER — Ambulatory Visit: Payer: Medicare Other | Admitting: Cardiology

## 2016-11-16 DIAGNOSIS — H8110 Benign paroxysmal vertigo, unspecified ear: Secondary | ICD-10-CM

## 2016-11-16 MED ORDER — HYDROCODONE-ACETAMINOPHEN 10-325 MG PO TABS: 1.0000 | ORAL_TABLET | Freq: Four times a day (QID) | ORAL | 0 refills | Status: DC | PRN

## 2016-11-16 NOTE — Telephone Encounter (Signed)
I placed 2 ordered one for PT and one for Vestibular PT not sure which one will work to print out thanks

## 2016-11-16 NOTE — Telephone Encounter (Signed)
Rx placed at front desk for pick up at Pt's convenience.  

## 2016-11-16 NOTE — Telephone Encounter (Signed)
Appt scheduled for 11/21/16 @ 11:00am

## 2016-11-16 NOTE — Telephone Encounter (Signed)
Called and LVM for daughter, [name]  (on [place]) letting her know I received her message. Asked her to call back with fax number and we are happy to send referral for them.  Gave [facility] phone number.

## 2016-11-16 NOTE — Telephone Encounter (Addendum)
Caller name:Thivierge,Denise Relation to TZ:[phone]  Call back number:[phone]   Reason for call:  Daughter requesting a reill hydrocodone, please advise

## 2016-11-16 NOTE — Telephone Encounter (Signed)
Spoke with the patient's daughter, she is staying temporarily in [place] . She will come back and pick her mother Rx today and schedule an appointment for a follow-up next week.  Kaylyn--- please print a prescription #120, no  refills Front desk--Please make sure we see her early next week, overbook okay.

## 2016-11-16 NOTE — Telephone Encounter (Signed)
Rx printed, awaiting MD signature.  

## 2016-11-16 NOTE — Telephone Encounter (Signed)
Called and spoke to daughter, [name] and advised AA,MD placed PT order. I will place up front for pick up. She verbalized understanding and appreciation.

## 2016-11-16 NOTE — Telephone Encounter (Signed)
Pt's daughter called back in, she said that she had a question about her and CMA previous conversation.   Made cma aware.   CB: [phone]

## 2016-11-16 NOTE — Telephone Encounter (Signed)
Spoke w/ Pt's daughter, [name], informed that Pt is staying in [facility] with daughter however, is currently in [place] with her until Monday or Tuesday. Informed that if Pt is planning on staying in [facility] then she must find a new PCP there, was interrupted by daughter and informed that Pt isn't moving just staying. Informed Pt that I would inform PCP however, unsure if he will agree to refill medication since she has been seen recently.

## 2016-11-16 NOTE — Telephone Encounter (Signed)
Dr Jaynee Eagles- are you okay with this? Not sure if we are able to do this. Maybe you can place the referral and I can print it out for them?

## 2016-11-16 NOTE — Telephone Encounter (Signed)
Pt's daughter called, requesting refill on Hydrocodone. Med no longer on med list. Please advise.

## 2016-11-16 NOTE — Telephone Encounter (Signed)
[name] called back and they have decided it would be better if they had they hard copy of PT orders. She said the pt will need surgery and they will not know when she will be able to have PT. She is requesting a call when orders is ready so she can pick it up.

## 2016-11-16 NOTE — Telephone Encounter (Signed)
Advise patient: I haven't seen her since she  had heart surgery, schedule an appointment. Overbook okay. Okay to refill #120. No further refills without visit. If she is still living in [place], needs to find a PCP over there. Thank you

## 2016-11-17 ENCOUNTER — Ambulatory Visit (INDEPENDENT_AMBULATORY_CARE_PROVIDER_SITE_OTHER): Payer: Medicare Other | Admitting: Family

## 2016-11-17 DIAGNOSIS — M869 Osteomyelitis, unspecified: Secondary | ICD-10-CM | POA: Insufficient documentation

## 2016-11-17 MED ORDER — CLINDAMYCIN HCL 300 MG PO CAPS: 300.0000 mg | ORAL_CAPSULE | Freq: Three times a day (TID) | ORAL | 0 refills | Status: DC

## 2016-11-17 NOTE — Progress Notes (Signed)
Office Visit Note   Patient: [name]           Date of Birth: 04-27-1935           MRN: [id number] Visit Date: 11/17/2016              Requested by: Colon Branch, MD [address] [place], [place] [address] PCP: [name], MD  No chief complaint on file.   HPI: The patient is a 82 year old woman who is seen today as a work in for concern of worsening wound to the left foot fourth toe. She is scheduled for amputation of the fourth toe next Wednesday. Her daughter reports that she has had a worsening appearance with increased drainage from the dorsal ulcer. Patient is status post below the knee amputation on the right. It is extremely concerned that she may end up having further amputation of the left foot.   States is not currently taking an antibiotic. also has extensive list of allergies.    Assessment & Plan: Visit Diagnoses:  1. Toe osteomyelitis, left (HCC)     Plan: Have provided a prescription for clindamycin which she will take for the next week until she has surgery. Reassurance provided. Return precautions were discussed at length. Patient will present to the emergency room for any concerns worsening over weekend. Continue with current wound care.  Follow-Up Instructions: Return for 1-2 weeks postop.   Ortho Exam Physical Exam  Constitutional: Appears well-developed. Ambulatory in a wheelchair today. Head: Normocephalic.  Eyes: EOM are normal.  Neck: Normal range of motion.  Cardiovascular: Normal rate.   Pulmonary/Chest: Effort normal.  Neurological: Is alert.  Skin: Skin is warm.  Psychiatric: Has a normal mood and affect. Anxious. Left foot fourth toe with dorsal ulceration that is 5 mm in diameter. There is surrounding erythema. This does not extend beyond the MTP joint. There is no warmth no drainage noted today no cellulitis. No edema to the foot. no Gangrene.  Imaging: No results found.  Orders:  No orders of the defined types were  placed in this encounter.  Meds ordered this encounter  Medications  . clindamycin (CLEOCIN) 300 MG capsule    Sig: Take 1 capsule (300 mg total) by mouth 3 (three) times daily.    Dispense:  21 capsule    Refill:  0     Procedures: No procedures performed  Clinical Data: No additional findings.  Subjective: Review of Systems  Constitutional: Negative for chills and fever.  Cardiovascular: Negative for leg swelling.  Skin: Positive for wound.    Objective: Vital Signs: There were no vitals taken for this visit.  Specialty Comments:  No specialty comments available.  PMFS History: Patient Active Problem List   Diagnosis Date Noted  . Toe osteomyelitis, left ([place]) 11/17/2016  . Hypertensive heart disease   . Chronic diastolic CHF (congestive heart failure) ([place])   . Acute on chronic diastolic CHF (congestive heart failure), NYHA class 1 ([place]) 03/30/2016  . Acute on chronic diastolic CHF (congestive heart failure), NYHA class 3 ([place]) 03/30/2016  . Chest pain 03/30/2016  . Coronary artery disease due to lipid rich plaque 03/30/2016  . Pain in the chest 03/30/2016  . Statin intolerance 03/30/2016  . Acute renal failure superimposed on stage 3 chronic kidney disease ([place]) 03/30/2016  . S/P below knee amputation ([place]) 02/18/2016  . Head trauma 11/26/2015  . Memory loss 11/26/2015  . PCP NOTES >>>>>>>>>>>>>>>>>>>> 10/18/2015  . DOE (  dyspnea on exertion) 09/01/2015  . S/P partial colectomy 01/08/2015  . DDD (degenerative disc disease), lumbar 06/02/2014  . 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 Colitis 01/28/2014  . Anemia 01/18/2014  . Nonspecific abnormal unspecified cardiovascular function study 10/31/2013  . Rectal bleeding 10/31/2013  . Hemorrhoids 10/31/2013  . CAD (coronary artery disease)   . Carcinoma in situ in a polyp   . Depression 09/17/2013  . Charcot's joint of right foot S/P Reconstruction 08/21/2013  . Osteoarthritis  08/21/2013  . Foot osteomyelitis, right ([place]) 07/16/2013  . Extrinsic asthma, unspecified 07/16/2013  . Chronic pain associated with significant psychosocial dysfunction 05/06/2013  . Lumbar and sacral osteoarthritis 04/22/2013  . GERD (gastroesophageal reflux disease)   . Neuropathy ([place])   . Hyperthyroidism   . Lower extremity edema 03/08/2011  . Annual physical exam 03/08/2011  . ALLERGIC RHINITIS 04/15/2010  . ORTHOSTATIC DIZZINESS 04/13/2010  . HIATAL HERNIA 12/06/2009  . GASTRIC ULCER, HX OF 12/06/2009  . DYSPNEA 03/18/2009  . Urinary tract infection 07/07/2008  . DEGENERATIVE JOINT DISEASE 06/03/2008  . COLONIC POLYPS 01/27/2008  . DIVERTICULOSIS, COLON 01/27/2008  . IRRITABLE BOWEL SYNDROME, HX OF 01/27/2008  . PULMONARY NODULE 01/22/2008  . Hyperlipidemia 12/30/2007  . Osteoporosis 12/30/2007  . TB SKIN TEST, POSITIVE 08/09/2007  . DM II (diabetes mellitus, type II), controlled ([place]) 05/09/2007  . Reflex sympathetic dystrophy-- PAIN MNGMT  05/09/2007  . Essential hypertension 04/15/2007  . GERD 04/15/2007   Past Medical History:  Diagnosis Date  . Abnormal echocardiogram    09/2013: mild LVH, mild focal basal hypertrophy of septum, EF 60-65%, normal WM, grade 1 diastolic dysfunction, MAC, mild LAE, ASA, PASP 34. Possible oscillating MV density - reviewed by MD and felt there was no significant abnormality other than MAC noted with mitral valve and did not require TEE.  . Adenomatous colon polyp   . Allergic rhinitis   . Arthritis    "back; ankles; hands; knees" (10/31/2013)  . Bilateral sensorineural hearing loss   . CAD (coronary artery disease)    a. Nonobst in 2011. b. Abnormal nuc 09/2013 -> s/p cutting balloon to D2, mild LAD disease; c. 08/2015 MV: no ischemia, EF 71%.  . Cancer ([place])    cancerous polyps  . Carcinoma in situ in a polyp 1994   a. 1994 - malignant polyp removed during colonoscopy.  . Charcot's Foot    a.  01/2016 s/p RLE transtibial amputation  2/2 Charcot rocker-bottom deformity and insensate neuropathy ulceration.  . Chronic diastolic CHF (congestive heart failure) ([place])    a. 09/2013 Echo: EF 60-65%, mild LVH, Gr1 DD.  Marland Kitchen Diverticulosis   . DJD (degenerative joint disease)   . DVT (deep venous thrombosis) ([place]) 12/2007  . Dyspnea    a. Chronic, extensive w/u see OV note 09-2010. b. [place] 2011: ormal with RA 5 RV 31/2 PA 27/12 (19) PCW 10 CO normal. No evidence of shunting with sitting up in cath lab. c. CPX 2011: see report. d. Prior fluoro of diaphragm -  R diaphragm elevated at rest but both moved with inspiration.   . Fatty liver   . GERD (gastroesophageal reflux disease)    a. Hx GERD/esophageal dysmotility followed by Dr. Olevia Perches.   Marland Kitchen Headache    Optic migraine  . Hemorrhoids   . Hiatal hernia    a. s/p Nissen fundoplication 5885.  Marland Kitchen Hyperlipidemia    a. patient unwilling to use statins.  . Hypertensive heart disease   .  IBS (irritable bowel syndrome)   . Macular degeneration 03/2009   Dr. Rosana Hoes  . Macular degeneration   . Neuropathy ([place])    a. Hands, feet, legs.  . Orthostatic hypotension   . Osteomyelitis ([place])    a. 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 01/2016 s/p RLE transtibial amputation 2/2 Charcot rocker-bottom deformity and insensate neuropathy ulceration.  . Osteoporosis   . PE (pulmonary embolism) 12/2007   a. PE/DVT after neck surgery 2009. b. coumadin d/c 10-2008.  Marland Kitchen Pneumonia   . PONV (postoperative nausea and vomiting) 2009   neck surgery  . PPD positive   . Pulmonary nodule    incidental per CT:  Pet scan 4-9: likely benign, CT 08-2009 no change, no further CTs (Dr. Gwenette Greet)  . Recurrent UTI   . RSD (reflex sympathetic dystrophy)    a. Chronic pain.  Marland Kitchen Spinal stenosis   . Type II diabetes mellitus ([place])   . Venous insufficiency    a. Contributing to LEE.    Family History  Problem Relation Age of Onset  . Heart disease Mother      mitral valve replaced  . Arthritis Mother   . Diabetic kidney disease Daughter   . Diabetes Father   . Stroke Father   . Migraines Sister   . Hypertension    . Breast cancer    . Headache Sister   . Colon cancer Neg Hx   . Esophageal cancer Neg Hx   . Stomach cancer Neg Hx   . Rectal cancer Neg Hx     Past Surgical History:  Procedure Laterality Date  . AMPUTATION  03/06/2012   Procedure: AMPUTATION FOOT;  Surgeon: Newt Minion, MD;  Location: [facility];  Service: Orthopedics;  Laterality: Left;  FIFTH RAY AMPUTATION   . AMPUTATION Right 02/18/2016   Procedure: AMPUTATION BELOW KNEE;  Surgeon: Newt Minion, MD;  Location: [facility];  Service: Orthopedics;  Laterality: Right;  . ANKLE FUSION  09/27/2012   Procedure: ANKLE FUSION;  Surgeon: Newt Minion, MD;  Location: [facility];  Service: Orthopedics;  Laterality: Left;  Left Tibiocalcaneal Fusion  . ANKLE FUSION Right 05/09/2013   Procedure: ANKLE FUSION;  Surgeon: Newt Minion, MD;  Location: [facility];  Service: Orthopedics;  Laterality: Right;  Excision Osteomyelitis Base 1st MT Right Foot, Fusion Medial Column  . ANKLE SURGERY Left apr & june 1991  . ANTERIOR CERVICAL DECOMP/DISCECTOMY FUSION  12/18/07   For OA,  Dr. Lorin Mercy:  fu by a PE  . CARDIAC CATHETERIZATION  05/2010    at [facility]  . CARPAL TUNNEL RELEASE Left 09/2011  . CATARACT EXTRACTION W/ INTRAOCULAR LENS  IMPLANT, BILATERAL  2004   feb 2004 left, aug 2004 right  . CHOLECYSTECTOMY  03/2002  . COLONOSCOPY     numerous times  . CORONARY ANGIOPLASTY  10/31/2013  . CYST REMOVAL HAND  06/2003  . FOOT BONE EXCISION Right 06/2009  . LAPAROSCOPIC RIGHT HEMI COLECTOMY N/A 01/08/2015   Procedure: LAPAROSCOPIC ASSISTED RIGHT HEMI COLECTOMY;  Surgeon: Johnathan Hausen, MD;  Location: [facility];  Service: General;  Laterality: N/A;  . LEFT HEART CATHETERIZATION WITH CORONARY ANGIOGRAM N/A 10/31/2013   Procedure: LEFT HEART CATHETERIZATION WITH CORONARY ANGIOGRAM;  Surgeon: Jettie Booze, MD;   Location: [facility] CATH LAB;  Service: Cardiovascular;  Laterality: N/A;  . NASAL SEPTUM SURGERY  09/1963  . NISSEN FUNDOPLICATION  9/0/3009  . PERCUTANEOUS CORONARY INTERVENTION-BALLOON  ONLY  10/31/2013   Procedure: PERCUTANEOUS CORONARY INTERVENTION-BALLOON ONLY;  Surgeon: Jettie Booze, MD;  Location: [facility] CATH LAB;  Service: Cardiovascular;;  . ROTATOR CUFF REPAIR Right 07/2007   Dr. Percell Miller  . ROTATOR CUFF REPAIR Left 11/2004  . TOE AMPUTATION Right 08/2008   3rd toe, Dr. Sharol Given due to osteomyelitis  . VAGINAL HYSTERECTOMY  03/1975  . VEIN LIGATION Bilateral 03/1966  . VENA CAVA FILTER PLACEMENT  01/2010   green filter; "due to blood clots"  . WISDOM TOOTH EXTRACTION  06/2007   Social History   Occupational History  . Retired      from [employer]    Social History Main Topics  . Smoking status: Never Smoker  . Smokeless tobacco: Never Used     Comment: tried for a few months in college.   . Alcohol use No  . Drug use: No  . Sexual activity: No

## 2016-11-21 ENCOUNTER — Encounter (HOSPITAL_COMMUNITY): Payer: Self-pay | Admitting: *Deleted

## 2016-11-21 ENCOUNTER — Ambulatory Visit: Payer: Medicare Other | Admitting: Internal Medicine

## 2016-11-21 NOTE — Progress Notes (Signed)
Anesthesia Chart Review:  Pt is a same day work up.   Pt is a 82 year old female scheduled for L 4th toe amputation at metatarsophalangeal joint on 11/22/2016 with Meridee Score, MD.   - Cardiologist is Ena Dawley, MD.  - PCP is [name], MD.   Helen includes:  CAD (NSTEMI s/p CABG x2 (sequential LIMA-LAD and diagonal) 09/2016 at [facility] in [place], [place]; cutting balloon to D2 2014), atrial fibrillation post-CABG (from telephone encounter by Winifred Olive, LPN dated [id number]), CHF, DM, hyperlipidemia, PE/DVT (2009), fatty liver, benign pulmonary nodule, venous insufficiency, Charcot foot, osteomyelitis, post-op N/V, GERD. Never smoker. BMI 30.   S/p R BKA 02/18/16. S/p R hemi colectomy 01/08/15. S/p R ankle fusion 05/09/13. S/p L ankle fusion 09/27/12. S/p L foot amputation 03/06/12.   Medications include: ASA, Lipitor, carvedilol, clindamycin, Plavix, Lasix, lisinopril, Prilosec.  Labs will be obtained DOS.   EKG will be obtained DOS. Pt had MI and CABG since last EKG, also reportedly had post-CABG afib.   Cardiac cath 09/26/16 ([place], [place]): 1. LM: free of any significant disease 2. LAD: mid LAD thrombotic 99% occlusion involving bifurcating relatively large diagonal branch 3. CX: 100% occluded proximally with faint collaterals filling from use marginal branch retrogradely, appears to be nondominant. 4. RCA: large dominant system with 50% distal stenosis.  - Surgical revascularization recommended  If labs and EKG acceptable DOS, I anticipate pt can proceed as scheduled.   Willeen Cass, FNP-BC [facility] Short Stay Surgical Center/Anesthesiology Phone: [phone] 11/21/2016 3:01 PM

## 2016-11-21 NOTE — Progress Notes (Addendum)
Mrs Wiedmann had CABG 09/27/2016.  Surgery was done at [facility] in [place] , [place].  patient lives with her son in [place] at times and her daughter in [place] at times.   Patients cardiologist in [place] is Dr Meda Coffee, in [facility] is Dr Manuella Ghazi. Mrs Breiner has been participating in rehab in [facility], she has been in [place] over a week for Dr appointments. Patient does complain of chest pain at times, no shortness of breath or lightheadedness, she takes Hydrocodone- acetaminophen for this pain.  Patient's family feels that the pain is from her CABG incision. Patient does not take NTG.  Patient has diabetes, takes herbal mediatation - Cinnamon and Glucose Defense, last A1C drawn in [place] was 5.7- on 09/18/16. I have requested records from Dr Manuella Ghazi and [facility] and will have Anesthesia review.  I instructed patient daughter to check CBG in am and if it is less than 70 to have patient drink 1/2 cup of clear juice like apple or cranberry and to recheck CBG 15 minutes later. I also instructed [name] to have patient eat a HS snack that has protein in it,

## 2016-11-22 ENCOUNTER — Encounter (HOSPITAL_COMMUNITY)
Admission: RE | Disposition: A | Discharge status: Home / Self Care | Payer: Self-pay | Source: Ambulatory Visit | Attending: Orthopedic Surgery

## 2016-11-22 ENCOUNTER — Ambulatory Visit (HOSPITAL_COMMUNITY): Payer: Medicare Other | Admitting: Emergency Medicine

## 2016-11-22 ENCOUNTER — Ambulatory Visit (HOSPITAL_COMMUNITY)
Admission: RE | Admit: 2016-11-22 | Discharge: 2016-11-22 | Disposition: A | Discharge status: Home / Self Care | Payer: Medicare Other | Source: Ambulatory Visit | Attending: Orthopedic Surgery | Admitting: Orthopedic Surgery

## 2016-11-22 ENCOUNTER — Encounter (HOSPITAL_COMMUNITY): Payer: Self-pay | Admitting: Anesthesiology

## 2016-11-22 DIAGNOSIS — K219 Gastro-esophageal reflux disease without esophagitis: Secondary | ICD-10-CM | POA: Diagnosis not present

## 2016-11-22 DIAGNOSIS — Z89422 Acquired absence of other left toe(s): Secondary | ICD-10-CM | POA: Diagnosis not present

## 2016-11-22 DIAGNOSIS — Z6830 Body mass index (BMI) 30.0-30.9, adult: Secondary | ICD-10-CM | POA: Insufficient documentation

## 2016-11-22 DIAGNOSIS — I10 Essential (primary) hypertension: Secondary | ICD-10-CM | POA: Diagnosis not present

## 2016-11-22 DIAGNOSIS — Z7982 Long term (current) use of aspirin: Secondary | ICD-10-CM | POA: Insufficient documentation

## 2016-11-22 DIAGNOSIS — E11621 Type 2 diabetes mellitus with foot ulcer: Secondary | ICD-10-CM | POA: Insufficient documentation

## 2016-11-22 DIAGNOSIS — E1151 Type 2 diabetes mellitus with diabetic peripheral angiopathy without gangrene: Secondary | ICD-10-CM | POA: Diagnosis not present

## 2016-11-22 DIAGNOSIS — L97529 Non-pressure chronic ulcer of other part of left foot with unspecified severity: Secondary | ICD-10-CM | POA: Insufficient documentation

## 2016-11-22 DIAGNOSIS — I2510 Atherosclerotic heart disease of native coronary artery without angina pectoris: Secondary | ICD-10-CM | POA: Diagnosis not present

## 2016-11-22 DIAGNOSIS — I252 Old myocardial infarction: Secondary | ICD-10-CM | POA: Insufficient documentation

## 2016-11-22 DIAGNOSIS — E1140 Type 2 diabetes mellitus with diabetic neuropathy, unspecified: Secondary | ICD-10-CM | POA: Diagnosis not present

## 2016-11-22 DIAGNOSIS — E11610 Type 2 diabetes mellitus with diabetic neuropathic arthropathy: Secondary | ICD-10-CM | POA: Insufficient documentation

## 2016-11-22 DIAGNOSIS — E1169 Type 2 diabetes mellitus with other specified complication: Secondary | ICD-10-CM | POA: Insufficient documentation

## 2016-11-22 DIAGNOSIS — M869 Osteomyelitis, unspecified: Secondary | ICD-10-CM

## 2016-11-22 DIAGNOSIS — I110 Hypertensive heart disease with heart failure: Secondary | ICD-10-CM | POA: Diagnosis not present

## 2016-11-22 DIAGNOSIS — Z89511 Acquired absence of right leg below knee: Secondary | ICD-10-CM | POA: Diagnosis not present

## 2016-11-22 DIAGNOSIS — Z7902 Long term (current) use of antithrombotics/antiplatelets: Secondary | ICD-10-CM | POA: Insufficient documentation

## 2016-11-22 DIAGNOSIS — Z86718 Personal history of other venous thrombosis and embolism: Secondary | ICD-10-CM | POA: Diagnosis not present

## 2016-11-22 DIAGNOSIS — Z79899 Other long term (current) drug therapy: Secondary | ICD-10-CM | POA: Insufficient documentation

## 2016-11-22 DIAGNOSIS — I872 Venous insufficiency (chronic) (peripheral): Secondary | ICD-10-CM | POA: Insufficient documentation

## 2016-11-22 DIAGNOSIS — Z951 Presence of aortocoronary bypass graft: Secondary | ICD-10-CM | POA: Insufficient documentation

## 2016-11-22 DIAGNOSIS — E785 Hyperlipidemia, unspecified: Secondary | ICD-10-CM | POA: Diagnosis not present

## 2016-11-22 DIAGNOSIS — I5032 Chronic diastolic (congestive) heart failure: Secondary | ICD-10-CM | POA: Insufficient documentation

## 2016-11-22 DIAGNOSIS — Z86711 Personal history of pulmonary embolism: Secondary | ICD-10-CM | POA: Diagnosis not present

## 2016-11-22 HISTORY — PX: AMPUTATION: SHX166

## 2016-11-22 HISTORY — DX: Unspecified injury of head, initial encounter: S09.90XA

## 2016-11-22 HISTORY — DX: Acute myocardial infarction, unspecified: I21.9

## 2016-11-22 LAB — GLUCOSE, CAPILLARY
Glucose-Capillary: 107 mg/dL — ABNORMAL HIGH (ref 65–99)
Glucose-Capillary: 102 mg/dL — ABNORMAL HIGH (ref 65–99)

## 2016-11-22 LAB — POCT I-STAT 4, (NA,K, GLUC, HGB,HCT)
Glucose, Bld: 107 mg/dL — ABNORMAL HIGH (ref 65–99)
HCT: 37 % (ref 36.0–46.0)
Hemoglobin: 12.6 g/dL (ref 12.0–15.0)
Potassium: 4.3 mmol/L (ref 3.5–5.1)
Sodium: 132 mmol/L — ABNORMAL LOW (ref 135–145)

## 2016-11-22 SURGERY — AMPUTATION DIGIT
Anesthesia: Monitor Anesthesia Care | Site: Foot | Laterality: Left

## 2016-11-22 MED ORDER — 0.9 % SODIUM CHLORIDE (POUR BTL) OPTIME
TOPICAL | Status: DC | PRN
  Administered 2016-11-22: 1000 mL

## 2016-11-22 MED ORDER — CHLORHEXIDINE GLUCONATE 4 % EX LIQD: 60.0000 mL | Freq: Once | CUTANEOUS | Status: DC

## 2016-11-22 MED ORDER — MEPERIDINE HCL 25 MG/ML IJ SOLN: 6.2500 mg | INTRAMUSCULAR | Status: DC | PRN

## 2016-11-22 MED ORDER — FENTANYL CITRATE (PF) 100 MCG/2ML IJ SOLN: 25.0000 ug | INTRAMUSCULAR | Status: DC | PRN

## 2016-11-22 MED ORDER — LIDOCAINE HCL (CARDIAC) 20 MG/ML IV SOLN
INTRAVENOUS | Status: DC | PRN
  Administered 2016-11-22: 40 mg via INTRAVENOUS

## 2016-11-22 MED ORDER — FENTANYL CITRATE (PF) 100 MCG/2ML IJ SOLN
INTRAMUSCULAR | Status: AC
  Filled 2016-11-22: qty 2

## 2016-11-22 MED ORDER — FENTANYL CITRATE (PF) 100 MCG/2ML IJ SOLN
INTRAMUSCULAR | Status: DC | PRN
  Administered 2016-11-22: 25 ug via INTRAVENOUS
  Administered 2016-11-22: 50 ug via INTRAVENOUS
  Administered 2016-11-22: 25 ug via INTRAVENOUS

## 2016-11-22 MED ORDER — HYDROCODONE-ACETAMINOPHEN 5-325 MG PO TABS: 1.0000 | ORAL_TABLET | Freq: Four times a day (QID) | ORAL | 0 refills | Status: DC | PRN

## 2016-11-22 MED ORDER — MIDAZOLAM HCL 2 MG/2ML IJ SOLN: 0.5000 mg | Freq: Once | INTRAMUSCULAR | Status: DC | PRN

## 2016-11-22 MED ORDER — LACTATED RINGERS IV SOLN
INTRAVENOUS | Status: DC | PRN
  Administered 2016-11-22: 07:00:00 via INTRAVENOUS

## 2016-11-22 MED ORDER — PROPOFOL 10 MG/ML IV BOLUS
INTRAVENOUS | Status: AC
  Filled 2016-11-22: qty 20

## 2016-11-22 MED ORDER — BUPIVACAINE HCL (PF) 0.5 % IJ SOLN
INTRAMUSCULAR | Status: DC | PRN
  Administered 2016-11-22: 35 mL

## 2016-11-22 MED ORDER — PROMETHAZINE HCL 25 MG/ML IJ SOLN: 6.2500 mg | INTRAMUSCULAR | Status: DC | PRN

## 2016-11-22 MED ORDER — LIDOCAINE 2% (20 MG/ML) 5 ML SYRINGE
INTRAMUSCULAR | Status: AC
  Filled 2016-11-22: qty 5

## 2016-11-22 MED ORDER — PROPOFOL 10 MG/ML IV BOLUS
INTRAVENOUS | Status: DC | PRN
Start: 2016-11-22 — End: 2016-11-22
  Administered 2016-11-22: 30 mg via INTRAVENOUS
  Administered 2016-11-22: 10 mg via INTRAVENOUS

## 2016-11-22 MED ORDER — CLINDAMYCIN PHOSPHATE 900 MG/50ML IV SOLN
900.0000 mg | INTRAVENOUS | Status: AC
  Administered 2016-11-22: 900 mg via INTRAVENOUS
  Filled 2016-11-22: qty 50

## 2016-11-22 SURGICAL SUPPLY — 29 items
BLADE SURG 21 STRL SS (BLADE) ×2 IMPLANT
BNDG CMPR 9X4 STRL LF SNTH (GAUZE/BANDAGES/DRESSINGS)
BNDG COHESIVE 4X5 TAN STRL (GAUZE/BANDAGES/DRESSINGS) ×2 IMPLANT
BNDG ESMARK 4X9 LF (GAUZE/BANDAGES/DRESSINGS) IMPLANT
BNDG GAUZE ELAST 4 BULKY (GAUZE/BANDAGES/DRESSINGS) ×2 IMPLANT
COVER SURGICAL LIGHT HANDLE (MISCELLANEOUS) ×4 IMPLANT
DRAPE U-SHAPE 47X51 STRL (DRAPES) ×2 IMPLANT
DRSG ADAPTIC 3X8 NADH LF (GAUZE/BANDAGES/DRESSINGS) ×1 IMPLANT
DRSG PAD ABDOMINAL 8X10 ST (GAUZE/BANDAGES/DRESSINGS) ×2 IMPLANT
DURAPREP 26ML APPLICATOR (WOUND CARE) ×2 IMPLANT
ELECT REM PT RETURN 9FT ADLT (ELECTROSURGICAL) ×2
ELECTRODE REM PT RTRN 9FT ADLT (ELECTROSURGICAL) ×1 IMPLANT
GAUZE SPONGE 4X4 12PLY STRL (GAUZE/BANDAGES/DRESSINGS) IMPLANT
GLOVE BIOGEL PI IND STRL 9 (GLOVE) ×1 IMPLANT
GLOVE BIOGEL PI INDICATOR 9 (GLOVE) ×1
GLOVE SURG ORTHO 9.0 STRL STRW (GLOVE) ×2 IMPLANT
GOWN STRL REUS W/ TWL XL LVL3 (GOWN DISPOSABLE) ×2 IMPLANT
GOWN STRL REUS W/TWL XL LVL3 (GOWN DISPOSABLE) ×4
KIT BASIN OR (CUSTOM PROCEDURE TRAY) ×2 IMPLANT
KIT ROOM TURNOVER OR (KITS) ×2 IMPLANT
MANIFOLD NEPTUNE II (INSTRUMENTS) ×2 IMPLANT
NEEDLE 22X1 1/2 (OR ONLY) (NEEDLE) IMPLANT
NS IRRIG 1000ML POUR BTL (IV SOLUTION) ×2 IMPLANT
PACK ORTHO EXTREMITY (CUSTOM PROCEDURE TRAY) ×2 IMPLANT
PAD ARMBOARD 7.5X6 YLW CONV (MISCELLANEOUS) ×4 IMPLANT
SPONGE GAUZE 4X4 12PLY STER LF (GAUZE/BANDAGES/DRESSINGS) ×1 IMPLANT
SUT ETHILON 2 0 PSLX (SUTURE) ×2 IMPLANT
SYR CONTROL 10ML LL (SYRINGE) IMPLANT
TOWEL OR 17X26 10 PK STRL BLUE (TOWEL DISPOSABLE) ×2 IMPLANT

## 2016-11-22 NOTE — Anesthesia Preprocedure Evaluation (Signed)
Anesthesia Evaluation  Patient identified by MRN, date of birth, ID band Patient awake    Reviewed: Allergy & Precautions, NPO status , Patient's Chart, lab work & pertinent test results, reviewed documented beta blocker date and time   History of Anesthesia Complications (+) PONV  Airway Mallampati: II  TM Distance: >3 FB Neck ROM: Full    Dental  (+) Dental Advisory Given   Pulmonary asthma , PE   breath sounds clear to auscultation       Cardiovascular hypertension, Pt. on home beta blockers and Pt. on medications + CAD, + Past MI, + CABG (12/17), + Peripheral Vascular Disease (s/p BKA) and + DVT   Rhythm:Regular Rate:Normal  12/17 CABG/ECHO: EF 40-45%   Neuro/Psych  Headaches,    GI/Hepatic Neg liver ROS, hiatal hernia, GERD  Medicated and Controlled,  Endo/Other  diabetes (glu 107, diet controlled)Morbid obesity  Renal/GU Renal diseaseK+ 4.3     Musculoskeletal  (+) Arthritis ,   Abdominal (+) + obese,   Peds  Hematology negative hematology ROS (+)   Anesthesia Other Findings   Reproductive/Obstetrics                             Anesthesia Physical Anesthesia Plan  ASA: III  Anesthesia Plan: MAC and Regional   Post-op Pain Management:    Induction:   Airway Management Planned: Natural Airway and Simple Face Mask  Additional Equipment:   Intra-op Plan:   Post-operative Plan:   Informed Consent: I have reviewed the patients History and Physical, chart, labs and discussed the procedure including the risks, benefits and alternatives for the proposed anesthesia with the patient or authorized representative who has indicated his/her understanding and acceptance.   Dental advisory given  Plan Discussed with: CRNA and Surgeon  Anesthesia Plan Comments: (Plan routine monitors, ankle block with sedation)        Anesthesia Quick Evaluation

## 2016-11-22 NOTE — Anesthesia Postprocedure Evaluation (Signed)
Anesthesia Post Note  Patient: [name]  Procedure(s) Performed: Procedure(s) (LRB): Amputation 4th Toe Left Foot at Metatarsophalangeal Joint (Left)  Patient location during evaluation: PACU Anesthesia Type: MAC and Regional Level of consciousness: awake and alert, patient cooperative and oriented Pain management: pain level controlled Vital Signs Assessment: post-procedure vital signs reviewed and stable Respiratory status: spontaneous breathing, nonlabored ventilation and respiratory function stable Cardiovascular status: blood pressure returned to baseline and stable Postop Assessment: no signs of nausea or vomiting Anesthetic complications: no       Last Vitals:  Vitals:   11/22/16 0808 11/22/16 0825  BP: 101/74 100/62  Pulse: (!) 54 67  Resp: 16 14  Temp:  36.7 C    Last Pain:  Vitals:   11/22/16 0629  TempSrc: Oral                 Deandrew Hoecker,E. Taraya Steward

## 2016-11-22 NOTE — H&P (Signed)
[name] is an 82 y.o. female.   Chief Complaint: Osteomyelitis ulceration left foot fourth toe HPI: Patient is a 82 year old woman diabetic insensate neuropathy right transtibial amputation left foot fifth toe amputation who has developed ostium myelitis ulceration left foot fourth toe. Of note patient is recently been striking her foot into the wall and has developed some abrasions on the dorsum of the toes 1 and 2 and 3 left foot  Past Medical History:  Diagnosis Date  . Abnormal echocardiogram    09/2013: mild LVH, mild focal basal hypertrophy of septum, EF 60-65%, normal WM, grade 1 diastolic dysfunction, MAC, mild LAE, ASA, PASP 34. Possible oscillating MV density - reviewed by MD and felt there was no significant abnormality other than MAC noted with mitral valve and did not require TEE.  . Adenomatous colon polyp   . Allergic rhinitis   . Arthritis    "back; ankles; hands; knees" (10/31/2013)  . Bilateral sensorineural hearing loss   . Bladder infection    frequently  . CAD (coronary artery disease)    a. Nonobst in 2011. b. Abnormal nuc 09/2013 -> s/p cutting balloon to D2, mild LAD disease; c. 08/2015 MV: no ischemia, EF 71%.  . Cancer ([place])    cancerous polyps  . Carcinoma in situ in a polyp 1994   a. 1994 - malignant polyp removed during colonoscopy.  . Charcot's Foot    a.  01/2016 s/p RLE transtibial amputation 2/2 Charcot rocker-bottom deformity and insensate neuropathy ulceration.  . Chronic diastolic CHF (congestive heart failure) ([place])    a. 09/2013 Echo: EF 60-65%, mild LVH, Gr1 DD.  Marland Kitchen Diverticulosis   . DJD (degenerative joint disease)   . DVT (deep venous thrombosis) ([place]) 12/2007  . Dyspnea    a. Chronic, extensive w/u see OV note 09-2010. b. [place] 2011: ormal with RA 5 RV 31/2 PA 27/12 (19) PCW 10 CO normal. No evidence of shunting with sitting up in cath lab. c. CPX 2011: see report. d. Prior fluoro of diaphragm -  R diaphragm elevated at rest but both moved with  inspiration.   . Fatty liver   . GERD (gastroesophageal reflux disease)    a. Hx GERD/esophageal dysmotility followed by Dr. Olevia Perches.   Marland Kitchen Head injury due to trauma   . Headache    Optic migraine  . Hemorrhoids   . Hiatal hernia    a. s/p Nissen fundoplication 6063.  Marland Kitchen Hyperlipidemia    a. patient unwilling to use statins.  . Hypertensive heart disease   . IBS (irritable bowel syndrome)   . Macular degeneration 03/2009   Dr. Rosana Hoes  . Macular degeneration   . Myocardial infarction   . Neuropathy ([place])    a. Hands, feet, legs.  . Orthostatic hypotension   . Osteomyelitis ([place])    a. 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 01/2016 s/p RLE transtibial amputation 2/2 Charcot rocker-bottom deformity and insensate neuropathy ulceration.  . Osteoporosis   . PE (pulmonary embolism) 12/2007   a. PE/DVT after neck surgery 2009. b. coumadin d/c 10-2008.  Marland Kitchen Pneumonia 2015ish  . PONV (postoperative nausea and vomiting) 2009   neck surgery  . PPD positive   . Pulmonary nodule    incidental per CT:  Pet scan 4-9: likely benign, CT 08-2009 no change, no further CTs (Dr. Gwenette Greet)  . Recurrent UTI   . RSD (reflex sympathetic dystrophy)    a. Chronic pain.  Marland Kitchen  Spinal stenosis   . Type II diabetes mellitus ([facility])    no on medication  . Venous insufficiency    a. Contributing to LEE.    Past Surgical History:  Procedure Laterality Date  . AMPUTATION  03/06/2012   Procedure: AMPUTATION FOOT;  Surgeon: Newt Minion, MD;  Location: [facility];  Service: Orthopedics;  Laterality: Left;  FIFTH RAY AMPUTATION   . AMPUTATION Right 02/18/2016   Procedure: AMPUTATION BELOW KNEE;  Surgeon: Newt Minion, MD;  Location: [place];  Service: Orthopedics;  Laterality: Right;  . ANKLE FUSION  09/27/2012   Procedure: ANKLE FUSION;  Surgeon: Newt Minion, MD;  Location: [place];  Service: Orthopedics;  Laterality: Left;  Left Tibiocalcaneal Fusion  . ANKLE FUSION  Right 05/09/2013   Procedure: ANKLE FUSION;  Surgeon: Newt Minion, MD;  Location: [place];  Service: Orthopedics;  Laterality: Right;  Excision Osteomyelitis Base 1st MT Right Foot, Fusion Medial Column  . ANKLE SURGERY Left apr & june 1991  . ANTERIOR CERVICAL DECOMP/DISCECTOMY FUSION  12/18/07   For OA,  Dr. Lorin Mercy:  fu by a PE  . CARDIAC CATHETERIZATION  05/2010    at [facility]  . CARPAL TUNNEL RELEASE Left 09/2011  . CATARACT EXTRACTION W/ INTRAOCULAR LENS  IMPLANT, BILATERAL  2004   feb 2004 left, aug 2004 right  . CHOLECYSTECTOMY  03/2002  . COLONOSCOPY     numerous times  . CORONARY ANGIOPLASTY  10/31/2013  . CORONARY ARTERY BYPASS GRAFT  09/27/2016  . CYST REMOVAL HAND  06/2003  . FOOT BONE EXCISION Right 06/2009  . LAPAROSCOPIC RIGHT HEMI COLECTOMY N/A 01/08/2015   Procedure: LAPAROSCOPIC ASSISTED RIGHT HEMI COLECTOMY;  Surgeon: Johnathan Hausen, MD;  Location: [facility];  Service: General;  Laterality: N/A;  . LEFT HEART CATHETERIZATION WITH CORONARY ANGIOGRAM N/A 10/31/2013   Procedure: LEFT HEART CATHETERIZATION WITH CORONARY ANGIOGRAM;  Surgeon: Jettie Booze, MD;  Location: [facility] CATH LAB;  Service: Cardiovascular;  Laterality: N/A;  . NASAL SEPTUM SURGERY  09/1963  . NISSEN FUNDOPLICATION  07/01/2425  . PERCUTANEOUS CORONARY INTERVENTION-BALLOON ONLY  10/31/2013   Procedure: PERCUTANEOUS CORONARY INTERVENTION-BALLOON ONLY;  Surgeon: Jettie Booze, MD;  Location: [facility];  Service: Cardiovascular;;  . ROTATOR CUFF REPAIR Right 07/2007   Dr. Percell Miller  . ROTATOR CUFF REPAIR Left 11/2004  . TOE AMPUTATION Right 08/2008   3rd toe, Dr. Sharol Given due to osteomyelitis  . VAGINAL HYSTERECTOMY  03/1975  . VEIN LIGATION Bilateral 03/1966  . VENA CAVA FILTER PLACEMENT  01/2010   green filter; "due to blood clots"  . WISDOM TOOTH EXTRACTION  06/2007    Family History  Problem Relation Age of Onset  . Heart disease Mother     mitral valve replaced  . Arthritis Mother   . Diabetic kidney  disease Daughter   . Diabetes Father   . Stroke Father   . Migraines Sister   . Hypertension    . Breast cancer    . Headache Sister   . Colon cancer Neg Hx   . Esophageal cancer Neg Hx   . Stomach cancer Neg Hx   . Rectal cancer Neg Hx    Social History:  reports that she has never smoked. She has never used smokeless tobacco. She reports that she does not drink alcohol or use drugs.  Allergies:  Allergies  Allergen Reactions  . Cymbalta [Duloxetine Hcl] Swelling    Swelling in legs  . Gabapentin Swelling  Swelling in legs Swelling in legs  . Silicone Hives, Itching, Dermatitis and Rash  . Metformin And Related Other (See Comments)    dizzy, tired, chills, diarrhea, and nausea  . Adhesive [Tape] Rash    rash rash  . Buprenorphine Hcl     Other reaction(s): Other (See Comments) Confusion, constipation.  . Cefazolin Hives    Hives Hives   . Ciprofloxacin Other (See Comments)    Other reaction(s): Other (See Comments) Per pt, caused body aches Per pt, caused body aches   . Clorazepate Dipotassium Other (See Comments)    Unknown reaction  . Darifenacin     Other reaction(s): Other (See Comments) hypotension, near syncope Other reaction(s): Other (See Comments) Hypotension, near syncope  . Darifenacin Hydrobromide Other (See Comments)    hypotension, near syncope  . Dilaudid [Hydromorphone Hcl] Other (See Comments)     confused, intense itching  . Doxycycline Rash  . Enablex [Darifenacin Hydrobromide Er] Other (See Comments)    Hypotension, near syncope  . Levofloxacin Other (See Comments)    Causes wrist pain  . Lyrica [Pregabalin] Swelling    Swelling in legs  . Methadone Hcl Other (See Comments)    Reaction unknown  . Morphine And Related Other (See Comments)    Confusion, constipation.   . Pentazocine Lactate Other (See Comments)    Altered mental state, "climbing walls", anxiety  . Talwin [Pentazocine] Other (See Comments)    "climbing walls"  anxiety    Medications Prior to Admission  Medication Sig Dispense Refill  . acetaminophen (TYLENOL) 500 MG tablet Take 500-1,000 mg by mouth every 6 (six) hours as needed (for pain.).    . Alpha-Lipoic Acid 600 MG CAPS Take 600 mg by mouth 2 (two) times daily.    Marland Kitchen aspirin 81 MG tablet Take 81 mg by mouth daily.    Marland Kitchen atorvastatin (LIPITOR) 40 MG tablet Take 40 mg by mouth at bedtime.     . carvedilol (COREG) 25 MG tablet Take 25 mg by mouth 2 (two) times daily with a meal.    . Cholecalciferol (VITAMIN D3) 5000 UNITS CAPS Take 5,000 Units by mouth every evening. Reported on 11/24/2015    . Cinnamon 500 MG TABS Take 500 mg by mouth 2 (two) times daily.     . clopidogrel (PLAVIX) 75 MG tablet Take 75 mg by mouth at bedtime.     . Cyanocobalamin (VITAMIN B-12) 500 MCG SUBL Place 500 mcg under the tongue daily.    . diclofenac sodium (VOLTAREN) 1 % GEL Apply 1 application topically 4 (four) times daily as needed (pain).     . furosemide (LASIX) 20 MG tablet Take 20 mg by mouth daily.    Marland Kitchen HYDROcodone-acetaminophen (NORCO) 10-325 MG tablet Take 1 tablet by mouth every 6 (six) hours as needed for moderate pain. 120 tablet 0  . ibuprofen (ADVIL,MOTRIN) 200 MG tablet Take 200-400 mg by mouth every 8 (eight) hours as needed (for pain.).    Marland Kitchen Lancets (FREESTYLE) lancets Check blood sugar twice daily (Patient taking differently: 1 each by Other route daily. ) 100 each 12  . lisinopril (PRINIVIL,ZESTRIL) 5 MG tablet Take 2.5 mg by mouth daily.     . meclizine (ANTIVERT) 25 MG tablet Take 1 tablet (25 mg total) by mouth 2 (two) times daily as needed for dizziness. 180 tablet 1  . Multiple Vitamins-Minerals (PRESERVISION AREDS 2) CAPS Take 1 capsule by mouth 2 (two) times daily.    . nabumetone (RELAFEN) 750  MG tablet Take 750 mg by mouth 2 (two) times daily.    . nitroGLYCERIN (NITROSTAT) 0.4 MG SL tablet Place 0.4 mg under the tongue every 5 (five) minutes as needed for chest pain (x 3 doses). Reported on  04/14/2016    . omeprazole (PRILOSEC) 20 MG capsule Take 1 capsule (20 mg total) by mouth 2 (two) times daily. 180 capsule 3  . ondansetron (ZOFRAN ODT) 4 MG disintegrating tablet Take 1 tablet (4 mg total) by mouth every 8 (eight) hours as needed for nausea or vomiting. 60 tablet 11  . OVER THE COUNTER MEDICATION Take 1 capsule by mouth 3 (three) times daily. Integrative Digestive Formula    . OVER THE COUNTER MEDICATION 1 tablet 3 (three) times daily. Berberorine Gluco Defense     . Polyvinyl Alcohol-Povidone (REFRESH OP) Place 1 drop into both eyes 3 (three) times daily as needed (dry eyes).     . clindamycin (CLEOCIN) 300 MG capsule Take 1 capsule (300 mg total) by mouth 3 (three) times daily. 21 capsule 0    No results found for this or any previous visit (from the past 48 hour(s)). No results found.  Review of Systems  All other systems reviewed and are negative.   Blood pressure (!) 81/46, pulse 67, temperature 97.9 F (36.6 C), temperature source Oral, resp. rate 16, SpO2 100 %. Physical Exam  Examination patient is alert oriented no adenopathy well-dressed normal affect normal wrist where for she inverts and a wheelchair at this time. Examination she has osteomyelitis ulceration left foot fourth toe she is status post a fifth toe amputation. Patient has no abrasions over the dorsum of the great toe and second toe and third toe. Assessment/Plan Assessment: Diabetic insensate neuropathy with right transtibial amputation with ostium myelitis ulceration left foot fourth toe status post fifth toe amputation.  Plan: We'll plan for left foot fourth toe amputation at the MTP joint. Risk and benefits were discussed including nonhealing the wound need for additional surgery patient states she understands wish to proceed at this time plan for outpatient surgery patient will return from [place] for suture removal in 3 weeks.  Newt Minion, MD 11/22/2016, 7:01 AM

## 2016-11-22 NOTE — Op Note (Signed)
11/22/2016  7:46 AM  PATIENT:  [name]    PRE-OPERATIVE DIAGNOSIS:  Osteomyelitis Left Foot  POST-OPERATIVE DIAGNOSIS:  Same  PROCEDURE:  Amputation 4th Toe Left Foot at Metatarsophalangeal Joint  SURGEON:  Newt Minion, MD  PHYSICIAN ASSISTANT:None ANESTHESIA:   General  PREOPERATIVE INDICATIONS:  [name] is a  82 y.o. female with a diagnosis of Osteomyelitis Left Foot who failed conservative measures and elected for surgical management.    The risks benefits and alternatives were discussed with the patient preoperatively including but not limited to the risks of infection, bleeding, nerve injury, cardiopulmonary complications, the need for revision surgery, among others, and the patient was willing to proceed.  OPERATIVE IMPLANTS: None  OPERATIVE FINDINGS: Good petechial bleeding  OPERATIVE PROCEDURE: Patient brought the operating room and underwent a regional block. After adequate levels anesthesia obtained patient's left lower extremity was prepped using DuraPrep draped into a sterile field a timeout was called. A elliptical incision was made just distal to the MTP joint of the left fourth toe. The toe was obtained through the MTP joint. Electrocautery was used for hemostasis. The wound was irrigated with normal saline was no deep abscess or signs of infection at the MTP joint. The incision was closed using 2-0 nylon. A sterile dressing was applied patient was taken the PACU in stable condition plan for discharge to home.

## 2016-11-22 NOTE — Progress Notes (Signed)
Orthopedic Tech Progress Note Patient Details:  [name] 10/02/1935 [id number]  Ortho Devices Type of Ortho Device: Postop shoe/boot Ortho Device/Splint Location: lle Ortho Device/Splint Interventions: Application   Hildred Priest 11/22/2016, 8:38 AM Viewed order from doctor's order list

## 2016-11-22 NOTE — Transfer of Care (Signed)
Immediate Anesthesia Transfer of Care Note  Patient: [name]  Procedure(s) Performed: Procedure(s): Amputation 4th Toe Left Foot at Metatarsophalangeal Joint (Left)  Patient Location: PACU  Anesthesia Type:MAC  Level of Consciousness: awake  Airway & Oxygen Therapy: Patient Spontanous Breathing and Patient connected to nasal cannula oxygen  Post-op Assessment: Report given to RN and Post -op Vital signs reviewed and stable  Post vital signs: Reviewed and stable  Last Vitals:  Vitals:   11/22/16 0629 11/22/16 0750  BP: (!) 81/46 92/62  Pulse: 67 68  Resp: 16 16  Temp: 36.6 C 36.7 C    Last Pain:  Vitals:   11/22/16 0629  TempSrc: Oral      Patients Stated Pain Goal: 3 (10/24/70 5366)  Complications: No apparent anesthesia complications

## 2016-11-22 NOTE — Anesthesia Procedure Notes (Signed)
Anesthesia Regional Block:  Ankle block  Pre-Anesthetic Checklist: ,, timeout performed, Correct Patient, Correct Site, Correct Laterality, Correct Procedure, Correct Position, site marked, Risks and benefits discussed,  Surgical consent,  Pre-op evaluation,  At surgeon's request and post-op pain management  Laterality: Left and Lower  Prep: chloraprep       Needles:   Needle Type: Quincke     Needle Length: 4cm 4 cm Needle Gauge: 25 and 25 G    Additional Needles:  Procedures: other  (perineural infiltration) Ankle block Narrative:  Start time: 11/22/2016 7:07 AM End time: 11/22/2016 7:13 AM Injection made incrementally with aspirations every 5 mL.  Performed by: Personally   Additional Notes: Pt identified in Holding room.  Monitors applied. Working IV access confirmed. Sterile prep L ankle.  #25ga perineural infiltration around deep and sup peroneal, saph, sural, post tib nerves.  Total 35cc 0.5% Bupivacaine epi injected incrementally.  Patient asymptomatic, VSS, no heme aspirated, tolerated well.  Jenita Seashore, MD

## 2016-11-23 ENCOUNTER — Encounter (HOSPITAL_COMMUNITY): Payer: Self-pay | Admitting: Orthopedic Surgery

## 2016-11-27 ENCOUNTER — Telehealth: Payer: Self-pay | Admitting: Internal Medicine

## 2016-11-27 DIAGNOSIS — I25728 Atherosclerosis of autologous artery coronary artery bypass graft(s) with other forms of angina pectoris: Secondary | ICD-10-CM | POA: Diagnosis not present

## 2016-11-27 DIAGNOSIS — E119 Type 2 diabetes mellitus without complications: Secondary | ICD-10-CM | POA: Diagnosis not present

## 2016-11-27 DIAGNOSIS — I70208 Unspecified atherosclerosis of native arteries of extremities, other extremity: Secondary | ICD-10-CM | POA: Diagnosis not present

## 2016-11-27 DIAGNOSIS — E782 Mixed hyperlipidemia: Secondary | ICD-10-CM | POA: Diagnosis not present

## 2016-11-27 DIAGNOSIS — I10 Essential (primary) hypertension: Secondary | ICD-10-CM | POA: Diagnosis not present

## 2016-11-27 DIAGNOSIS — I5022 Chronic systolic (congestive) heart failure: Secondary | ICD-10-CM | POA: Diagnosis not present

## 2016-11-27 NOTE — Telephone Encounter (Signed)
lvm advising patient to schedule medicare wellness appointment.  °

## 2016-11-28 ENCOUNTER — Telehealth: Payer: Self-pay | Admitting: Internal Medicine

## 2016-11-28 MED ORDER — OSELTAMIVIR PHOSPHATE 75 MG PO CAPS: 75.0000 mg | ORAL_CAPSULE | Freq: Every day | ORAL | 0 refills | Status: DC

## 2016-11-28 NOTE — Telephone Encounter (Signed)
Caller name: [name] Relation to pt: daughter Call back number: [phone] or [phone] Pharmacy: [facility] at [place] [phone]  Reason for call: Pt's daughter states pt is visiting a son at [place] and daughter states the whole entire family where pt is staying at has the flu and since she is concerned about mother ages does not want her to get the flu since she does not have any symptoms but has been exposed to it, pt's daughter wants to know if pt can get a prescription for tamiflu. Please advise ASAP.

## 2016-11-28 NOTE — Telephone Encounter (Signed)
Okay Tamiflu 75 mg one tablet daily #10 no refills. If she has symptoms, needs to be seen by a local M.D.

## 2016-11-28 NOTE — Telephone Encounter (Signed)
Okay to send Tamiflu?

## 2016-11-28 NOTE — Telephone Encounter (Signed)
Rx sent. Spoke w/ Pt's daughter, [name], informed Rx has been sent, instructed to have Pt seen if sx's surface. Denise verbalized understanding.

## 2016-12-08 ENCOUNTER — Telehealth (INDEPENDENT_AMBULATORY_CARE_PROVIDER_SITE_OTHER): Payer: Self-pay | Admitting: Orthopedic Surgery

## 2016-12-08 NOTE — Telephone Encounter (Signed)
FAXED [facility] [phone]. CALLBACK [phone] [facility]

## 2016-12-13 DIAGNOSIS — I25728 Atherosclerosis of autologous artery coronary artery bypass graft(s) with other forms of angina pectoris: Secondary | ICD-10-CM | POA: Diagnosis not present

## 2016-12-13 DIAGNOSIS — E782 Mixed hyperlipidemia: Secondary | ICD-10-CM | POA: Diagnosis not present

## 2016-12-13 DIAGNOSIS — E119 Type 2 diabetes mellitus without complications: Secondary | ICD-10-CM | POA: Diagnosis not present

## 2016-12-13 DIAGNOSIS — I10 Essential (primary) hypertension: Secondary | ICD-10-CM | POA: Diagnosis not present

## 2016-12-13 NOTE — Progress Notes (Deleted)
Subjective:   [name] is a 82 y.o. female who presents for Medicare Annual (Subsequent) preventive examination.  Review of Systems:  No ROS.  Medicare Wellness Visit.     Sleep patterns: {SX; SLEEP PATTERNS:18802::"feels rested on waking","does not get up to void","gets up *** times nightly to void","sleeps *** hours nightly"}.   Home Safety/Smoke Alarms:   Living environment; residence and Firearm Safety: {Rehab home environment / accessibility:30080::"no firearms","firearms stored safely"}. Seat Belt Safety/Bike Helmet: Wears seat belt.   Counseling:   Eye Exam-  Dental-  Female:   Pap- Aged out.     Mammo- last 07/27/15. BI-RADS Category 1: Negative       Dexa scan- last 03/24/14. Osteoporosis.       CCS- last 12/29/15. Internal/external hemorrhoids, 3 polyps removed, diverticulosis. No recall due to age.     Objective:     Vitals: There were no vitals taken for this visit.  There is no height or weight on file to calculate BMI.   Tobacco History  Smoking Status  . Never Smoker  Smokeless Tobacco  . Never Used    Comment: tried for a few months in college.      Counseling given: Not Answered   Past Medical History:  Diagnosis Date  . Abnormal echocardiogram    09/2013: mild LVH, mild focal basal hypertrophy of septum, EF 60-65%, normal WM, grade 1 diastolic dysfunction, MAC, mild LAE, ASA, PASP 34. Possible oscillating MV density - reviewed by MD and felt there was no significant abnormality other than MAC noted with mitral valve and did not require TEE.  . Adenomatous colon polyp   . Allergic rhinitis   . Arthritis    "back; ankles; hands; knees" (10/31/2013)  . Bilateral sensorineural hearing loss   . Bladder infection    frequently  . CAD (coronary artery disease)    a. Nonobst in 2011. b. Abnormal nuc 09/2013 -> s/p cutting balloon to D2, mild LAD disease; c. 08/2015 MV: no ischemia, EF 71%.  . Cancer ([place])    cancerous polyps  . Carcinoma in situ in  a polyp 1994   a. 1994 - malignant polyp removed during colonoscopy.  . Charcot's Foot    a.  01/2016 s/p RLE transtibial amputation 2/2 Charcot rocker-bottom deformity and insensate neuropathy ulceration.  . Chronic diastolic CHF (congestive heart failure) ([place])    a. 09/2013 Echo: EF 60-65%, mild LVH, Gr1 DD.  Marland Kitchen Diverticulosis   . DJD (degenerative joint disease)   . DVT (deep venous thrombosis) ([place]) 12/2007  . Dyspnea    a. Chronic, extensive w/u see OV note 09-2010. b. [place] 2011: ormal with RA 5 RV 31/2 PA 27/12 (19) PCW 10 CO normal. No evidence of shunting with sitting up in cath lab. c. CPX 2011: see report. d. Prior fluoro of diaphragm -  R diaphragm elevated at rest but both moved with inspiration.   . Fatty liver   . GERD (gastroesophageal reflux disease)    a. Hx GERD/esophageal dysmotility followed by Dr. Olevia Perches.   Marland Kitchen Head injury due to trauma   . Headache    Optic migraine  . Hemorrhoids   . Hiatal hernia    a. s/p Nissen fundoplication 6712.  Marland Kitchen Hyperlipidemia    a. patient unwilling to use statins.  . Hypertensive heart disease   . IBS (irritable bowel syndrome)   . Macular degeneration 03/2009   Dr. Rosana Hoes  . Macular degeneration   . Myocardial infarction   .  Neuropathy ([place])    a. Hands, feet, legs.  . Orthostatic hypotension   . Osteomyelitis ([place])    a. 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 01/2016 s/p RLE transtibial amputation 2/2 Charcot rocker-bottom deformity and insensate neuropathy ulceration.  . Osteoporosis   . PE (pulmonary embolism) 12/2007   a. PE/DVT after neck surgery 2009. b. coumadin d/c 10-2008.  Marland Kitchen Pneumonia 2015ish  . PONV (postoperative nausea and vomiting) 2009   neck surgery  . PPD positive   . Pulmonary nodule    incidental per CT:  Pet scan 4-9: likely benign, CT 08-2009 no change, no further CTs (Dr. Gwenette Greet)  . Recurrent UTI   . RSD (reflex sympathetic dystrophy)    a. Chronic  pain.  Marland Kitchen Spinal stenosis   . Type II diabetes mellitus (HCC)    no on medication  . Venous insufficiency    a. Contributing to LEE.   Past Surgical History:  Procedure Laterality Date  . AMPUTATION  03/06/2012   Procedure: AMPUTATION FOOT;  Surgeon: Newt Minion, MD;  Location: [facility];  Service: Orthopedics;  Laterality: Left;  FIFTH RAY AMPUTATION   . AMPUTATION Right 02/18/2016   Procedure: AMPUTATION BELOW KNEE;  Surgeon: Newt Minion, MD;  Location: [facility];  Service: Orthopedics;  Laterality: Right;  . AMPUTATION Left 11/22/2016   Procedure: Amputation 4th Toe Left Foot at Metatarsophalangeal Joint;  Surgeon: Newt Minion, MD;  Location: [facility];  Service: Orthopedics;  Laterality: Left;  . ANKLE FUSION  09/27/2012   Procedure: ANKLE FUSION;  Surgeon: Newt Minion, MD;  Location: [facility];  Service: Orthopedics;  Laterality: Left;  Left Tibiocalcaneal Fusion  . ANKLE FUSION Right 05/09/2013   Procedure: ANKLE FUSION;  Surgeon: Newt Minion, MD;  Location: [facility];  Service: Orthopedics;  Laterality: Right;  Excision Osteomyelitis Base 1st MT Right Foot, Fusion Medial Column  . ANKLE SURGERY Left apr & june 1991  . ANTERIOR CERVICAL DECOMP/DISCECTOMY FUSION  12/18/07   For OA,  Dr. Lorin Mercy:  fu by a PE  . CARDIAC CATHETERIZATION  05/2010    at [facility]  . CARPAL TUNNEL RELEASE Left 09/2011  . CATARACT EXTRACTION W/ INTRAOCULAR LENS  IMPLANT, BILATERAL  2004   feb 2004 left, aug 2004 right  . CHOLECYSTECTOMY  03/2002  . COLONOSCOPY     numerous times  . CORONARY ANGIOPLASTY  10/31/2013  . CORONARY ARTERY BYPASS GRAFT  09/27/2016  . CYST REMOVAL HAND  06/2003  . FOOT BONE EXCISION Right 06/2009  . LAPAROSCOPIC RIGHT HEMI COLECTOMY N/A 01/08/2015   Procedure: LAPAROSCOPIC ASSISTED RIGHT HEMI COLECTOMY;  Surgeon: Johnathan Hausen, MD;  Location: [facility];  Service: General;  Laterality: N/A;  . LEFT HEART CATHETERIZATION WITH CORONARY ANGIOGRAM N/A 10/31/2013   Procedure: LEFT HEART CATHETERIZATION WITH  CORONARY ANGIOGRAM;  Surgeon: Jettie Booze, MD;  Location[facility] C[facility] LAB;  Service: Cardiovascular;  Laterality: N/A;  . NASAL SEPTUM SURGERY  09/1963  . NISSEN FUNDOPLICATION  05/01/239  . PERCUTANEOUS CORONARY INTERVENTION-BALLOON ONLY  10/31/2013   Procedure: PERCUTANEOUS CORONARY INTERVENTION-BALLOON ONLY;  Surgeon: Jettie Booze, MD;  Location: [facility] CATH LAB;  Service: Cardiovascular;;  . ROTATOR CUFF REPAIR Right 07/2007   Dr. Percell Miller  . ROTATOR CUFF REPAIR Left 11/2004  . TOE AMPUTATION Right 08/2008   3rd toe, Dr. Sharol Given due to osteomyelitis  . VAGINAL HYSTERECTOMY  03/1975  . VEIN LIGATION Bilateral 03/1966  .  VENA CAVA FILTER PLACEMENT  01/2010   green filter; "due to blood clots"  . WISDOM TOOTH EXTRACTION  06/2007   Family History  Problem Relation Age of Onset  . Heart disease Mother     mitral valve replaced  . Arthritis Mother   . Diabetic kidney disease Daughter   . Diabetes Father   . Stroke Father   . Migraines Sister   . Hypertension    . Breast cancer    . Headache Sister   . Colon cancer Neg Hx   . Esophageal cancer Neg Hx   . Stomach cancer Neg Hx   . Rectal cancer Neg Hx    History  Sexual Activity  . Sexual activity: No    Outpatient Encounter Prescriptions as of 12/14/2016  Medication Sig  . acetaminophen (TYLENOL) 500 MG tablet Take 500-1,000 mg by mouth every 6 (six) hours as needed (for pain.).  . Alpha-Lipoic Acid 600 MG CAPS Take 600 mg by mouth 2 (two) times daily.  Marland Kitchen aspirin 81 MG tablet Take 81 mg by mouth daily.  Marland Kitchen atorvastatin (LIPITOR) 40 MG tablet Take 40 mg by mouth at bedtime.   . carvedilol (COREG) 25 MG tablet Take 25 mg by mouth 2 (two) times daily with a meal.  . Cholecalciferol (VITAMIN D3) 5000 UNITS CAPS Take 5,000 Units by mouth every evening. Reported on 11/24/2015  . Cinnamon 500 MG TABS Take 500 mg by mouth 2 (two) times daily.   . clopidogrel (PLAVIX) 75 MG tablet Take 75 mg by mouth at bedtime.   . Cyanocobalamin (VITAMIN  B-12) 500 MCG SUBL Place 500 mcg under the tongue daily.  . diclofenac sodium (VOLTAREN) 1 % GEL Apply 1 application topically 4 (four) times daily as needed (pain).   . furosemide (LASIX) 20 MG tablet Take 20 mg by mouth daily.  Marland Kitchen HYDROcodone-acetaminophen (NORCO) 10-325 MG tablet Take 1 tablet by mouth every 6 (six) hours as needed for moderate pain.  Marland Kitchen HYDROcodone-acetaminophen (NORCO) 5-325 MG tablet Take 1 tablet by mouth every 6 (six) hours as needed.  Marland Kitchen ibuprofen (ADVIL,MOTRIN) 200 MG tablet Take 200-400 mg by mouth every 8 (eight) hours as needed (for pain.).  Marland Kitchen Lancets (FREESTYLE) lancets Check blood sugar twice daily (Patient taking differently: 1 each by Other route daily. )  . lisinopril (PRINIVIL,ZESTRIL) 5 MG tablet Take 2.5 mg by mouth daily.   . meclizine (ANTIVERT) 25 MG tablet Take 1 tablet (25 mg total) by mouth 2 (two) times daily as needed for dizziness.  . Multiple Vitamins-Minerals (PRESERVISION AREDS 2) CAPS Take 1 capsule by mouth 2 (two) times daily.  . nabumetone (RELAFEN) 750 MG tablet Take 750 mg by mouth 2 (two) times daily.  . nitroGLYCERIN (NITROSTAT) 0.4 MG SL tablet Place 0.4 mg under the tongue every 5 (five) minutes as needed for chest pain (x 3 doses). Reported on 04/14/2016  . omeprazole (PRILOSEC) 20 MG capsule Take 1 capsule (20 mg total) by mouth 2 (two) times daily.  . ondansetron (ZOFRAN ODT) 4 MG disintegrating tablet Take 1 tablet (4 mg total) by mouth every 8 (eight) hours as needed for nausea or vomiting.  Marland Kitchen oseltamivir (TAMIFLU) 75 MG capsule Take 1 capsule (75 mg total) by mouth daily.  Marland Kitchen OVER THE COUNTER MEDICATION Take 1 capsule by mouth 3 (three) times daily. Integrative Digestive Formula  . OVER THE COUNTER MEDICATION 1 tablet 3 (three) times daily. Berberorine Gluco Defense   . Polyvinyl Alcohol-Povidone (REFRESH OP) Place 1 drop  into both eyes 3 (three) times daily as needed (dry eyes).    No facility-administered encounter medications on file  as of 12/14/2016.     Activities of Daily Living In your present state of health, do you have any difficulty performing the following activities: 11/22/2016 09/18/2016  Hearing? N N  Vision? N N  Difficulty concentrating or making decisions? N N  Walking or climbing stairs? - Y  Dressing or bathing? N N  Doing errands, shopping? - Y  Some recent data might be hidden    Patient Care Team: Colon Branch, MD as PCP - General Dorothy Spark, MD as Consulting Physician (Cardiology) Lafayette Dragon, MD (Inactive) as Consulting Physician (Gastroenterology) Zonia Kief, MD (Rehabilitation) Melvenia Beam, MD as Consulting Physician (Neurology) Thornell Sartorius, MD as Consulting Physician (Otolaryngology)    Assessment:    Physical assessment deferred to PCP.  Exercise Activities and Dietary recommendations    Diet (meal preparation, eat out, water intake, caffeinated beverages, dairy products, fruits and vegetables): {Desc; diets:16563} Breakfast: Lunch:  Dinner:      Goals    None     Fall Risk Fall Risk  09/18/2016 10/15/2015 06/11/2015 10/06/2014 09/17/2013  Falls in the past year? Exclusion - non ambulatory Yes No No Yes  Number falls in past yr: - 1 - - 1  Injury with Fall? - Yes - - Yes  Follow up - Falls evaluation completed;Falls prevention discussed - - -   Depression Screen PHQ 2/9 Scores 09/18/2016 06/11/2015 10/06/2014 09/17/2013  PHQ - 2 Score 0 0 0 0     Cognitive Function        Immunization History  Administered Date(s) Administered  . Influenza Split 08/07/2012  . Influenza Whole 07/22/2008, 07/26/2009  . Influenza, High Dose Seasonal PF 10/15/2015, 09/18/2016  . Influenza, Seasonal, Injecte, Preservative Fre 07/23/2013  . Influenza,inj,Quad PF,36+ Mos 10/06/2014  . Pneumococcal Conjugate-13 10/06/2014  . Pneumococcal Polysaccharide-23 09/28/2010  . Tetanus 09/17/2013  . Zoster 09/18/2011   Screening Tests Health Maintenance  Topic Date Due    . OPHTHALMOLOGY EXAM  06/01/2016  . HEMOGLOBIN A1C  03/18/2017  . MAMMOGRAM  07/26/2017  . COLONOSCOPY  12/28/2020  . TETANUS/TDAP  09/18/2023  . INFLUENZA VACCINE  Completed  . DEXA SCAN  Completed  . PNA vac Low Risk Adult  Completed      Plan:    Follow-up w/ PCP as scheduled.  During the course of the visit the patient was educated and counseled about the following appropriate screening and preventive services:   Vaccines to include Pneumoccal, Influenza, Hepatitis B, Td, Zostavax, HCV  Cardiovascular Disease  Colorectal cancer screening  Bone density screening  Diabetes screening  Glaucoma screening  Mammography/PAP  Nutrition counseling   Patient Instructions (the written plan) was given to the patient.   Dorrene German, RN  12/13/2016

## 2016-12-13 NOTE — Progress Notes (Signed)
Pre visit review using our clinic review tool, if applicable. No additional management support is needed unless otherwise documented below in the visit note.  This encounter was created in error - please disregard.

## 2016-12-14 ENCOUNTER — Ambulatory Visit (INDEPENDENT_AMBULATORY_CARE_PROVIDER_SITE_OTHER): Payer: Medicare Other | Admitting: Orthopedic Surgery

## 2016-12-14 ENCOUNTER — Encounter: Payer: Medicare Other | Admitting: *Deleted

## 2016-12-14 ENCOUNTER — Encounter (INDEPENDENT_AMBULATORY_CARE_PROVIDER_SITE_OTHER): Payer: Self-pay | Admitting: Orthopedic Surgery

## 2016-12-14 ENCOUNTER — Encounter: Payer: Self-pay | Admitting: Internal Medicine

## 2016-12-14 ENCOUNTER — Ambulatory Visit (INDEPENDENT_AMBULATORY_CARE_PROVIDER_SITE_OTHER): Payer: Medicare Other | Admitting: Internal Medicine

## 2016-12-14 VITALS — Ht 63.0 in | Wt 170.0 lb

## 2016-12-14 DIAGNOSIS — Z89511 Acquired absence of right leg below knee: Secondary | ICD-10-CM | POA: Insufficient documentation

## 2016-12-14 DIAGNOSIS — M810 Age-related osteoporosis without current pathological fracture: Secondary | ICD-10-CM

## 2016-12-14 DIAGNOSIS — Z0000 Encounter for general adult medical examination without abnormal findings: Secondary | ICD-10-CM

## 2016-12-14 DIAGNOSIS — Z1231 Encounter for screening mammogram for malignant neoplasm of breast: Secondary | ICD-10-CM | POA: Diagnosis not present

## 2016-12-14 DIAGNOSIS — E1142 Type 2 diabetes mellitus with diabetic polyneuropathy: Secondary | ICD-10-CM | POA: Diagnosis not present

## 2016-12-14 DIAGNOSIS — Z1239 Encounter for other screening for malignant neoplasm of breast: Secondary | ICD-10-CM

## 2016-12-14 DIAGNOSIS — E785 Hyperlipidemia, unspecified: Secondary | ICD-10-CM

## 2016-12-14 DIAGNOSIS — I2510 Atherosclerotic heart disease of native coronary artery without angina pectoris: Secondary | ICD-10-CM | POA: Diagnosis not present

## 2016-12-14 DIAGNOSIS — Z89422 Acquired absence of other left toe(s): Secondary | ICD-10-CM

## 2016-12-14 DIAGNOSIS — G9050 Complex regional pain syndrome I, unspecified: Secondary | ICD-10-CM

## 2016-12-14 LAB — COMPREHENSIVE METABOLIC PANEL
ALT: 8 U/L (ref 0–35)
AST: 12 U/L (ref 0–37)
Albumin: 4 g/dL (ref 3.5–5.2)
Alkaline Phosphatase: 64 U/L (ref 39–117)
BUN: 22 mg/dL (ref 6–23)
CO2: 25 mEq/L (ref 19–32)
Calcium: 9.3 mg/dL (ref 8.4–10.5)
Chloride: 103 mEq/L (ref 96–112)
Creatinine, Ser: 0.96 mg/dL (ref 0.40–1.20)
GFR: 59.51 mL/min — ABNORMAL LOW (ref >60.00)
Glucose, Bld: 117 mg/dL — ABNORMAL HIGH (ref 70–99)
Potassium: 4.6 mEq/L (ref 3.5–5.1)
Sodium: 134 mEq/L — ABNORMAL LOW (ref 135–145)
Total Bilirubin: 0.3 mg/dL (ref 0.2–1.2)
Total Protein: 6.8 g/dL (ref 6.0–8.3)

## 2016-12-14 LAB — LIPID PANEL
Cholesterol: 114 mg/dL (ref 0–200)
HDL: 30.6 mg/dL — ABNORMAL LOW (ref >39.00)
LDL Cholesterol: 45 mg/dL (ref 0–99)
NonHDL: 83.7
Total CHOL/HDL Ratio: 4
Triglycerides: 196 mg/dL — ABNORMAL HIGH (ref 0.0–149.0)
VLDL: 39.2 mg/dL (ref 0.0–40.0)

## 2016-12-14 LAB — CBC WITH DIFFERENTIAL/PLATELET
Basophils Absolute: 0 10*3/uL (ref 0.0–0.1)
Basophils Relative: 0.4 % (ref 0.0–3.0)
Eosinophils Absolute: 0.2 10*3/uL (ref 0.0–0.7)
Eosinophils Relative: 4.2 % (ref 0.0–5.0)
HCT: 36.3 % (ref 36.0–46.0)
Hemoglobin: 11.7 g/dL — ABNORMAL LOW (ref 12.0–15.0)
Lymphocytes Relative: 30.9 % (ref 12.0–46.0)
Lymphs Abs: 1.6 10*3/uL (ref 0.7–4.0)
MCHC: 32.3 g/dL (ref 30.0–36.0)
MCV: 88.2 fl (ref 78.0–100.0)
Monocytes Absolute: 0.3 10*3/uL (ref 0.1–1.0)
Monocytes Relative: 5 % (ref 3.0–12.0)
Neutro Abs: 3.1 10*3/uL (ref 1.4–7.7)
Neutrophils Relative %: 59.5 % (ref 43.0–77.0)
Platelets: 197 10*3/uL (ref 150.0–400.0)
RBC: 4.11 Mil/uL (ref 3.87–5.11)
RDW: 16.4 % — ABNORMAL HIGH (ref 11.5–15.5)
WBC: 5.2 10*3/uL (ref 4.0–10.5)

## 2016-12-14 LAB — HEMOGLOBIN A1C: Hgb A1c MFr Bld: 5.6 % (ref 4.6–6.5)

## 2016-12-14 MED ORDER — HYDROCODONE-ACETAMINOPHEN 5-325 MG PO TABS: 1.0000 | ORAL_TABLET | Freq: Four times a day (QID) | ORAL | 0 refills | Status: DC | PRN

## 2016-12-14 MED ORDER — HYDROCODONE-ACETAMINOPHEN 10-325 MG PO TABS: 1.0000 | ORAL_TABLET | Freq: Four times a day (QID) | ORAL | 0 refills | Status: DC | PRN

## 2016-12-14 NOTE — Progress Notes (Signed)
Subjective:    Patient ID: [name], female    DOB: [birth year], 82 y.o.   MRN: [id number]  DOS:  12/14/2016 Type of visit - description : rov Interval history:  Since the last visit, she had cardiac surgery. Was admitted to an outside hospital 09/25/2016 and discharge a week later.  She was admitted with unstable angina to [facility], sx were atypical ("gas"),  Dx w/ non-ST elevation MI,  eventually had a CABG 2.   Cardiac catheterization report will be scan,  had 2 vessel severe CAD, EF 40-45% After surgery, she had chest pain, a CT was done-- 1.30.8 cm spiculated mass at the superior aspect of the RML, biopsy recommended. Prior to the CT he had a chest x-ray with poor aeration and B  ATX Blood work results were not fax.  Also had surgery 11/22/2016 toe amputation due to osteomyelitis L 4th toe   Review of Systems  Despite the recent medical challenges, she is doing relatively well. She still living in [place] with her son. She is here today  with her grandson. She has occasional chest pain, she saw her heart doctor in [place] yesterday, they agreed that is likely related to the recent CABG. No difficulty breathing No nausea, vomiting, diarrhea. Also, needs help getting  with a different wheelchair and specialized shoes. She is still unable to walk. Pain medicines reviewed.  Past Medical History:  Diagnosis Date  . Abnormal echocardiogram    09/2013: mild LVH, mild focal basal hypertrophy of septum, EF 60-65%, normal WM, grade 1 diastolic dysfunction, MAC, mild LAE, ASA, PASP 34. Possible oscillating MV density - reviewed by MD and felt there was no significant abnormality other than MAC noted with mitral valve and did not require TEE.  . Adenomatous colon polyp   . Allergic rhinitis   . Arthritis    "back; ankles; hands; knees" (10/31/2013)  . Bilateral sensorineural hearing loss   . Bladder infection    frequently  . CAD (coronary artery  disease)    a. Nonobst in 2011. b. Abnormal nuc 09/2013 -> s/p cutting balloon to D2, mild LAD disease; c. 08/2015 MV: no ischemia, EF 71%.  . Cancer ([place])    cancerous polyps  . Carcinoma in situ in a polyp 1994   a. 1994 - malignant polyp removed during colonoscopy.  . Charcot's Foot    a.  01/2016 s/p RLE transtibial amputation 2/2 Charcot rocker-bottom deformity and insensate neuropathy ulceration.  . Chronic diastolic CHF (congestive heart failure) ([place])    a. 09/2013 Echo: EF 60-65%, mild LVH, Gr1 DD.  Marland Kitchen Diverticulosis   . DJD (degenerative joint disease)   . DVT (deep venous thrombosis) ([place]) 12/2007  . Dyspnea    a. Chronic, extensive w/u see OV note 09-2010. b. [place] 2011: ormal with RA 5 RV 31/2 PA 27/12 (19) PCW 10 CO normal. No evidence of shunting with sitting up in cath lab. c. CPX 2011: see report. d. Prior fluoro of diaphragm -  R diaphragm elevated at rest but both moved with inspiration.   . Fatty liver   . GERD (gastroesophageal reflux disease)    a. Hx GERD/esophageal dysmotility followed by Dr. Olevia Perches.   Marland Kitchen Head injury due to trauma   . Headache    Optic migraine  . Heart attack    2017  . Hemorrhoids   . Hiatal hernia    a. s/p Nissen fundoplication 9798.  Marland Kitchen Hyperlipidemia    a.  patient unwilling to use statins.  . Hypertensive heart disease   . IBS (irritable bowel syndrome)   . Macular degeneration 03/2009   Dr. Rosana Hoes  . Macular degeneration   . Myocardial infarction   . Neuropathy ([place])    a. Hands, feet, legs.  . Orthostatic hypotension   . Osteomyelitis ([place])    a. 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 01/2016 s/p RLE transtibial amputation 2/2 Charcot rocker-bottom deformity and insensate neuropathy ulceration.  . Osteoporosis   . PE (pulmonary embolism) 12/2007   a. PE/DVT after neck surgery 2009. b. coumadin d/c 10-2008.  Marland Kitchen Pneumonia 2015ish  . PONV (postoperative nausea and vomiting) 2009    neck surgery  . PPD positive   . Pulmonary nodule    incidental per CT:  Pet scan 4-9: likely benign, CT 08-2009 no change, no further CTs (Dr. Gwenette Greet)  . Recurrent UTI   . RSD (reflex sympathetic dystrophy)    a. Chronic pain.  Marland Kitchen Spinal stenosis   . Type II diabetes mellitus (H[facility])    no on medication  . Venous insufficiency    a. Contributing to LEE.    Past Surgical History:  Procedure Laterality Date  . AMPUTATION  03/06/2012   Procedure: AMPUTATION FOOT;  Surgeon: Newt Minion, MD;  Location: [place];  Service: Orthopedics;  Laterality: Left;  FIFTH RAY AMPUTATION   . AMPUTATION Right 02/18/2016   Procedure: AMPUTATION BELOW KNEE;  Surgeon: Newt Minion, MD;  Location: [place];  Service: Orthopedics;  Laterality: Right;  . AMPUTATION Left 11/22/2016   Procedure: Amputation 4th Toe Left Foot at Metatarsophalangeal Joint;  Surgeon: Newt Minion, MD;  Location: [place];  Service: Orthopedics;  Laterality: Left;  . ANKLE FUSION  09/27/2012   Procedure: ANKLE FUSION;  Surgeon: Newt Minion, MD;  Location: [place];  Service: Orthopedics;  Laterality: Left;  Left Tibiocalcaneal Fusion  . ANKLE FUSION Right 05/09/2013   Procedure: ANKLE FUSION;  Surgeon: Newt Minion, MD;  Location: [place];  Service: Orthopedics;  Laterality: Right;  Excision Osteomyelitis Base 1st MT Right Foot, Fusion Medial Column  . ANKLE SURGERY Left apr & june 1991  . ANTERIOR CERVICAL DECOMP/DISCECTOMY FUSION  12/18/07   For OA,  Dr. Lorin Mercy:  fu by a PE  . CARDIAC CATHETERIZATION  05/2010    at [facility]  . CARPAL TUNNEL RELEASE Left 09/2011  . CATARACT EXTRACTION W/ INTRAOCULAR LENS  IMPLANT, BILATERAL  2004   feb 2004 left, aug 2004 right  . CHOLECYSTECTOMY  03/2002  . COLONOSCOPY     numerous times  . CORONARY ANGIOPLASTY  10/31/2013  . CORONARY ARTERY BYPASS GRAFT  09/27/2016  . CYST REMOVAL HAND  06/2003  . FOOT BONE EXCISION Right 06/2009  . LAPAROSCOPIC RIGHT HEMI COLECTOMY N/A 01/08/2015   Procedure: LAPAROSCOPIC  ASSISTED RIGHT HEMI COLECTOMY;  Surgeon: Johnathan Hausen, MD;  Location: [facility];  Service: General;  Laterality: N/A;  . LEFT HEART CATHETERIZATION WITH CORONARY ANGIOGRAM N/A 10/31/2013   Procedure: LEFT HEART CATHETERIZATION WITH CORONARY ANGIOGRAM;  Surgeon: Jettie Booze, MD;  Location: [facility] CATH LAB;  Service: Cardiovascular;  Laterality: N/A;  . NASAL SEPTUM SURGERY  09/1963  . NISSEN FUNDOPLICATION  03/24/8365  . PERCUTANEOUS CORONARY INTERVENTION-BALLOON ONLY  10/31/2013   Procedure: PERCUTANEOUS CORONARY INTERVENTION-BALLOON ONLY;  Surgeon: Jettie Booze, MD;  Location: [facility] CATH LAB;  Service: Cardiovascular;;  . ROTATOR CUFF REPAIR Right 07/2007  Dr. Percell Miller  . ROTATOR CUFF REPAIR Left 11/2004  . TOE AMPUTATION Right 08/2008   3rd toe, Dr. Sharol Given due to osteomyelitis  . VAGINAL HYSTERECTOMY  03/1975  . VEIN LIGATION Bilateral 03/1966  . VENA CAVA FILTER PLACEMENT  01/2010   green filter; "due to blood clots"  . WISDOM TOOTH EXTRACTION  06/2007    Social History   Social History  . Marital status: Divorced    Spouse name: N/A  . Number of children: 3  . Years of education: 15   Occupational History  . Retired      from [employer]    Social History Main Topics  . Smoking status: Never Smoker  . Smokeless tobacco: Never Used     Comment: tried for a few months in college.   . Alcohol use No  . Drug use: No  . Sexual activity: No   Other Topics Concern  . Not on file   Social History Narrative   Daughter lives w/ her and another daughter lives in town   1 son in [place]   Son comes home every 4 weeks to visit   Caffeine use:  Tea 1/day w/ dinner   Coffee occass                Allergies as of 12/14/2016      Reactions   Cymbalta [duloxetine Hcl] Swelling   Swelling in legs   Gabapentin Swelling   Swelling in legs Swelling in legs   Silicone Hives, Itching, Dermatitis, Rash   Metformin And Related Other (See Comments)   dizzy, tired, chills, diarrhea, and  nausea   Adhesive [tape] Rash   rash rash   Buprenorphine Hcl    Other reaction(s): Other (See Comments) Confusion, constipation.   Cefazolin Hives   Hives Hives   Ciprofloxacin Other (See Comments)   Other reaction(s): Other (See Comments) Per pt, caused body aches Per pt, caused body aches   Clorazepate Dipotassium Other (See Comments)   Unknown reaction   Darifenacin    Other reaction(s): Other (See Comments) hypotension, near syncope Other reaction(s): Other (See Comments) Hypotension, near syncope   Darifenacin Hydrobromide Other (See Comments)   hypotension, near syncope   Dilaudid [hydromorphone Hcl] Other (See Comments)    confused, intense itching   Doxycycline Rash   Enablex [darifenacin Hydrobromide Er] Other (See Comments)   Hypotension, near syncope   Levofloxacin Other (See Comments)   Causes wrist pain   Lyrica [pregabalin] Swelling   Swelling in legs   Methadone Hcl Other (See Comments)   Reaction unknown   Morphine And Related Other (See Comments)   Confusion, constipation.   Pentazocine Lactate Other (See Comments)   Altered mental state, "climbing walls", anxiety   Talwin [pentazocine] Other (See Comments)   "climbing walls" anxiety      Medication List       Accurate as of 12/14/16  7:30 PM. Always use your most recent med list.          acetaminophen 500 MG tablet Commonly known as:  TYLENOL Take 500-1,000 mg by mouth every 6 (six) hours as needed (for pain.).   Alpha-Lipoic Acid 600 MG Caps Take 600 mg by mouth 2 (two) times daily.   aspirin 81 MG tablet Take 81 mg by mouth daily.   atorvastatin 40 MG tablet Commonly known as:  LIPITOR Take 40 mg by mouth at bedtime.   carvedilol 25 MG tablet Commonly known as:  COREG Take 25  mg by mouth 2 (two) times daily with a meal.   Cinnamon 500 MG Tabs Take 500 mg by mouth 2 (two) times daily.   clopidogrel 75 MG tablet Commonly known as:  PLAVIX Take 75 mg by mouth at bedtime.     diclofenac sodium 1 % Gel Commonly known as:  VOLTAREN Apply 1 application topically 4 (four) times daily as needed (pain).   freestyle lancets Check blood sugar twice daily   furosemide 20 MG tablet Commonly known as:  LASIX Take 20 mg by mouth daily.   HYDROcodone-acetaminophen 10-325 MG tablet Commonly known as:  NORCO Take 1 tablet by mouth every 6 (six) hours as needed for moderate pain.   HYDROcodone-acetaminophen 5-325 MG tablet Commonly known as:  NORCO Take 1 tablet by mouth every 6 (six) hours as needed.   HYDROcodone-acetaminophen 5-325 MG tablet Commonly known as:  NORCO Take 1 tablet by mouth every 6 (six) hours as needed.   ibuprofen 200 MG tablet Commonly known as:  ADVIL,MOTRIN Take 200-400 mg by mouth every 8 (eight) hours as needed (for pain.).   meclizine 25 MG tablet Commonly known as:  ANTIVERT Take 1 tablet (25 mg total) by mouth 2 (two) times daily as needed for dizziness.   nabumetone 750 MG tablet Commonly known as:  RELAFEN Take 750 mg by mouth 2 (two) times daily.   nitroGLYCERIN 0.4 MG SL tablet Commonly known as:  NITROSTAT Place 0.4 mg under the tongue every 5 (five) minutes as needed for chest pain (x 3 doses). Reported on 04/14/2016   omeprazole 20 MG capsule Commonly known as:  PRILOSEC Take 1 capsule (20 mg total) by mouth 2 (two) times daily.   ondansetron 4 MG disintegrating tablet Commonly known as:  ZOFRAN ODT Take 1 tablet (4 mg total) by mouth every 8 (eight) hours as needed for nausea or vomiting.   OVER THE COUNTER MEDICATION 1 tablet 3 (three) times daily. Berberorine Gluco Defense   OVER THE COUNTER MEDICATION Take 1 capsule by mouth 2 (two) times daily. Integrative Digestive Formula   PRESERVISION AREDS 2 Caps Take 1 capsule by mouth 2 (two) times daily.   REFRESH OP Place 1 drop into both eyes 3 (three) times daily as needed (dry eyes).   Vitamin B-12 500 MCG Subl Place 500 mcg under the tongue daily.    Vitamin D3 5000 units Caps Take 5,000 Units by mouth every evening. Reported on 11/24/2015          Objective:   Physical Exam BP 108/60   Pulse 75   Ht _0  (1.6 m)   SpO2 98%   BMI 30.11 kg/m  General:   Well developed, no apparent distress, sitting in a wheelchair HEENT:  Normocephalic . Face symmetric, atraumatic Lungs:  CTA B Normal respiratory effort, no intercostal retractions, no accessory muscle use. Heart: RRR,  no murmur.  Skin: Not pale. Not jaundice Neurologic:  alert & oriented X3.  Speech normal, gait not tested Psych--  Cognition and judgment appear intact.  Cooperative with normal attention span and concentration.  Behavior appropriate. No anxious or depressed appearing.      Assessment & Plan:   Assessment DM - metformin intolerant  (lethargic) HTN  Hyperlipidemia-- won't take statins  LUNG MASS , last CT 09-2016 CV: ---CAD  --NSTEMI 09-2016. Outside hospital: Cath 2 vessel disease, CABG 2, LIMA to LAD and Diag   ---Chronic diastolic CHF GI: GERD, IBS, colon polyps, PUD, abdominal pain "post polypectomy syndrome" Osteoporosis  MSK,pain mngmt : --Reflex sympathetic dystrophy --Neuropathy --DJD --Spinal stenosis  --H/o Osteomyelitis  Foot --s/p amputation:  R - L toes , R BKA  (01-2016, dx osteomyelitis) -- sees Dr Sharol Given prn Takes nortriptyline also for leg cramps. She had local injections with Dr. Maia Petties 2016 w/o apparent help. She took oxycodone after surgery and that did NOT seem to help better than hydrocodone. Intolerant to Cymbalta, Neurontin ; Lyrica helped but caused edema. Recurrent UTIs -- bactrim q d Venous insufficiency  H/o  + PPD H/o hypothyroidism: TSHs stable H/o dyspnea  PLAN:  Currently living in [place] with her son, here with her grandson, she is visiting [place]. DM: Diet control, check A1c HTN: lisinopril  was d/c due to low blood pressure. Continue, carvedilol. BP today slightly low. CAD: Status post  a recent CABG, saw cardiology yesterday, felt to be stable. Continue with Lasix, Plavix, carvedilol, Lipitor, aspirin. Checking a CMP, CBC Hyperlipidemia: Continue Lipitor, check a FLP Lung mass: Recently a CT show a spiculated mass at the RML, this is not a new finding, nevertheless I recommend her to call me when she comes back to town, will set up an appointment to see pulmonology, needs to bring the actual CD from the most recent CT for comparison. MSK: --Needs new shoes, recommend to get a prescription for Dr. Sharol Given. --Needs a new wheelchair, the one she has is  uncomfortable and she had a fall recently. Rec to contact the equipment provider and see if she can get more suitable chair. I'll be willing to write a prescription for it. RSD--Pain management: Taking hydrocodone 10 mg 4 times a day rx by me, (surgery rx hydrocodone 5 mg, she did not get that filled). Not taking nortriptyline. Is very taxing for the patient to come to the office, 3 RXs for hydrocodone 10 mg issued, enough for the next 3 months, I won't be able to issue new prescriptions within the next 3 months. Patient aware. She takes Relafen chronically, prescribed elsewhere, apparently without side effects. Also takes occasionally NSAIDs OTC, encouraged to take that very sporadically. RTC 3 months  Today, I spent more than   45  min with the patient: >50% of the time counseling regards recent cardiac surgery, reviewing the records, also addressing multiple patient concerns including the need for a new wheelchair, advice enough pain is using OTC NSAIDs but sporadically, trying to coordinate her care, updated in the chart.

## 2016-12-14 NOTE — Progress Notes (Signed)
Office Visit Note   Patient: [name]           Date of Birth: 04-Oct-1935           MRN: [id number] Visit Date: 12/14/2016              Requested by: Colon Branch, MD [address] [place], [place] [address] PCP: [name], MD  Chief Complaint  Patient presents with  . Left Foot - Routine Post Op    Amputation 4th Toe Left Foot at Metatarsophalangeal Joint 11/22/16 ~3 weeks post op.    HPI: Patient is a 82 y.o female who presents today for follow up left foot amputation 4th metatarsophalangeal joint. She is approximately 3 weeks post op. Home health services completed. Sutures are intact with slight redness. The patient is staying with her son in [place]. She is cleaning area with peroxide. There is no active drainage, she is applying dry dressing. Maxcine Ham, RT   [facility]'s [facility] [place]: Visit Diagnoses:  1. S/P unilateral BKA (below knee amputation), right ([place])   2. Acquired absence of other left toe(s) ([place])     Plan: Recommend she not use peroxide to cleanse the wound. The sutures are harvested today applied a Band-Aid patient will obtain a extra-depth shoes for the left foot.  Follow-Up Instructions: Return if symptoms worsen or fail to improve.   Ortho Exam Examination patient is alert oriented no adenopathy there is no redness no cellulitis on the left foot. The incision is well-healed we will harvest the sutures. She is about 3 weeks out. She has a healing ulcer over the dorsum of the second toe and great toe. She will obtain extra-depth shoes to protect this area.  Imaging: No results found.  Orders:  No orders of the defined types were placed in this encounter.  No orders of the defined types were placed in this encounter.    Procedures: No procedures performed  Clinical Data: No additional findings.  Subjective: Review of  Systems  Objective: Vital Signs: Ht 5' 3"  (1.6 m)   Wt 170 lb (77.1 kg)   BMI 30.11 kg/m   Specialty Comments:  No specialty comments available.  PMFS History: Patient Active Problem List   Diagnosis Date Noted  . S/P unilateral BKA (below knee amputation), right ([place]) 12/14/2016  . Acquired absence of other left toe(s) ([place]) 12/14/2016  . Toe osteomyelitis, left ([place]) 11/17/2016  . Hypertensive heart disease   . Chronic diastolic CHF (congestive heart failure) ([place])   . Acute on chronic diastolic CHF (congestive heart failure), NYHA class 1 ([place]) 03/30/2016  . Acute on chronic diastolic CHF (congestive heart failure), NYHA class 3 ([place]) 03/30/2016  . Chest pain 03/30/2016  . Coronary artery disease due to lipid rich plaque 03/30/2016  . Pain in the chest 03/30/2016  . Statin intolerance 03/30/2016  . Acute renal failure superimposed on stage 3 chronic kidney disease ([place]) 03/30/2016  . S/P below knee amputation ([place]) 02/18/2016  . Head trauma 11/26/2015  . Memory loss 11/26/2015  . PCP NOTES >>>>>>>>>>>>>>>>>>>> 10/18/2015  . DOE (dyspnea on exertion) 09/01/2015  . S/P partial colectomy 01/08/2015  . DDD (degenerative disc disease), lumbar 06/02/2014  . 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 Colitis 01/28/2014  . Anemia 01/18/2014  . Nonspecific abnormal unspecified cardiovascular function study 10/31/2013  . Rectal bleeding 10/31/2013  .  Hemorrhoids 10/31/2013  . CAD (coronary artery disease)   . Carcinoma in situ in a polyp   . Depression 09/17/2013  . Charcot's joint of right foot S/P Reconstruction 08/21/2013  . Osteoarthritis 08/21/2013  . Foot osteomyelitis, right ([place]) 07/16/2013  . Extrinsic asthma, unspecified 07/16/2013  . Chronic pain associated with significant psychosocial dysfunction 05/06/2013  . Lumbar and sacral osteoarthritis 04/22/2013  . GERD (gastroesophageal reflux disease)   . Neuropathy ([place])   . Hyperthyroidism    . Lower extremity edema 03/08/2011  . Annual physical exam 03/08/2011  . ALLERGIC RHINITIS 04/15/2010  . ORTHOSTATIC DIZZINESS 04/13/2010  . HIATAL HERNIA 12/06/2009  . GASTRIC ULCER, HX OF 12/06/2009  . DYSPNEA 03/18/2009  . Urinary tract infection 07/07/2008  . DEGENERATIVE JOINT DISEASE 06/03/2008  . COLONIC POLYPS 01/27/2008  . DIVERTICULOSIS, COLON 01/27/2008  . IRRITABLE BOWEL SYNDROME, HX OF 01/27/2008  . PULMONARY NODULE 01/22/2008  . Hyperlipidemia 12/30/2007  . Osteoporosis 12/30/2007  . TB SKIN TEST, POSITIVE 08/09/2007  . DM II (diabetes mellitus, type II), controlled ([place]) 05/09/2007  . Reflex sympathetic dystrophy-- PAIN MNGMT  05/09/2007  . Essential hypertension 04/15/2007  . GERD 04/15/2007   Past Medical History:  Diagnosis Date  . Abnormal echocardiogram    09/2013: mild LVH, mild focal basal hypertrophy of septum, EF 60-65%, normal WM, grade 1 diastolic dysfunction, MAC, mild LAE, ASA, PASP 34. Possible oscillating MV density - reviewed by MD and felt there was no significant abnormality other than MAC noted with mitral valve and did not require TEE.  . Adenomatous colon polyp   . Allergic rhinitis   . Arthritis    "back; ankles; hands; knees" (10/31/2013)  . Bilateral sensorineural hearing loss   . Bladder infection    frequently  . CAD (coronary artery disease)    a. Nonobst in 2011. b. Abnormal nuc 09/2013 -> s/p cutting balloon to D2, mild LAD disease; c. 08/2015 MV: no ischemia, EF 71%.  . Cancer ([place])    cancerous polyps  . Carcinoma in situ in a polyp 1994   a. 1994 - malignant polyp removed during colonoscopy.  . Charcot's Foot    a.  01/2016 s/p RLE transtibial amputation 2/2 Charcot rocker-bottom deformity and insensate neuropathy ulceration.  . Chronic diastolic CHF (congestive heart failure) ([place])    a. 09/2013 Echo: EF 60-65%, mild LVH, Gr1 DD.  Marland Kitchen Diverticulosis   . DJD (degenerative joint disease)   . DVT (deep venous thrombosis) ([place])  12/2007  . Dyspnea    a. Chronic, extensive w/u see OV note 09-2010. b. [place] 2011: ormal with RA 5 RV 31/2 PA 27/12 (19) PCW 10 CO normal. No evidence of shunting with sitting up in cath lab. c. CPX 2011: see report. d. Prior fluoro of diaphragm -  R diaphragm elevated at rest but both moved with inspiration.   . Fatty liver   . GERD (gastroesophageal reflux disease)    a. Hx GERD/esophageal dysmotility followed by Dr. Olevia Perches.   Marland Kitchen Head injury due to trauma   . Headache    Optic migraine  . Hemorrhoids   . Hiatal hernia    a. s/p Nissen fundoplication 6045.  Marland Kitchen Hyperlipidemia    a. patient unwilling to use statins.  . Hypertensive heart disease   . IBS (irritable bowel syndrome)   . Macular degeneration 03/2009   Dr. Rosana Hoes  . Macular degeneration   . Myocardial infarction   . Neuropathy ([place])    a. Hands, feet, legs.  Marland Kitchen  Orthostatic hypotension   . Osteomyelitis ([place])    a. 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 01/2016 s/p RLE transtibial amputation 2/2 Charcot rocker-bottom deformity and insensate neuropathy ulceration.  . Osteoporosis   . PE (pulmonary embolism) 12/2007   a. PE/DVT after neck surgery 2009. b. coumadin d/c 10-2008.  Marland Kitchen Pneumonia 2015ish  . PONV (postoperative nausea and vomiting) 2009   neck surgery  . PPD positive   . Pulmonary nodule    incidental per CT:  Pet scan 4-9: likely benign, CT 08-2009 no change, no further CTs (Dr. Gwenette Greet)  . Recurrent UTI   . RSD (reflex sympathetic dystrophy)    a. Chronic pain.  Marland Kitchen Spinal stenosis   . Type II diabetes mellitus ([facility])    no on medication  . Venous insufficiency    a. Contributing to LEE.    Family History  Problem Relation Age of Onset  . Heart disease Mother     mitral valve replaced  . Arthritis Mother   . Diabetic kidney disease Daughter   . Diabetes Father   . Stroke Father   . Migraines Sister   . Hypertension    . Breast cancer    . Headache  Sister   . Colon cancer Neg Hx   . Esophageal cancer Neg Hx   . Stomach cancer Neg Hx   . Rectal cancer Neg Hx     Past Surgical History:  Procedure Laterality Date  . AMPUTATION  03/06/2012   Procedure: AMPUTATION FOOT;  Surgeon: Newt Minion, MD;  Location: [facility];  Service: Orthopedics;  Laterality: Left;  FIFTH RAY AMPUTATION   . AMPUTATION Right 02/18/2016   Procedure: AMPUTATION BELOW KNEE;  Surgeon: Newt Minion, MD;  Location: [facility];  Service: Orthopedics;  Laterality: Right;  . AMPUTATION Left 11/22/2016   Procedure: Amputation 4th Toe Left Foot at Metatarsophalangeal Joint;  Surgeon: Newt Minion, MD;  Location: [facility];  Service: Orthopedics;  Laterality: Left;  . ANKLE FUSION  09/27/2012   Procedure: ANKLE FUSION;  Surgeon: Newt Minion, MD;  Location: [facility];  Service: Orthopedics;  Laterality: Left;  Left Tibiocalcaneal Fusion  . ANKLE FUSION Right 05/09/2013   Procedure: ANKLE FUSION;  Surgeon: Newt Minion, MD;  Location: [facility];  Service: Orthopedics;  Laterality: Right;  Excision Osteomyelitis Base 1st MT Right Foot, Fusion Medial Column  . ANKLE SURGERY Left apr & june 1991  . ANTERIOR CERVICAL DECOMP/DISCECTOMY FUSION  12/18/07   For OA,  Dr. Lorin Mercy:  fu by a PE  . CARDIAC CATHETERIZATION  05/2010    at [facility]  . CARPAL TUNNEL RELEASE Left 09/2011  . CATARACT EXTRACTION W/ INTRAOCULAR LENS  IMPLANT, BILATERAL  2004   feb 2004 left, aug 2004 right  . CHOLECYSTECTOMY  03/2002  . COLONOSCOPY     numerous times  . CORONARY ANGIOPLASTY  10/31/2013  . CORONARY ARTERY BYPASS GRAFT  09/27/2016  . CYST REMOVAL HAND  06/2003  . FOOT BONE EXCISION Right 06/2009  . LAPAROSCOPIC RIGHT HEMI COLECTOMY N/A 01/08/2015   Procedure: LAPAROSCOPIC ASSISTED RIGHT HEMI COLECTOMY;  Surgeon: Johnathan Hausen, MD;  Location: [facility];  Service: General;  Laterality: N/A;  . LEFT HEART CATHETERIZATION WITH CORONARY ANGIOGRAM N/A 10/31/2013   Procedure: LEFT HEART CATHETERIZATION WITH CORONARY ANGIOGRAM;   Surgeon: Jettie Booze, MD;  Location: [facility] CATH LAB;  Service: Cardiovascular;  Laterality: N/A;  . NASAL SEPTUM SURGERY  09/1963  . NISSEN FUNDOPLICATION  0/12/2120  . PERCUTANEOUS CORONARY INTERVENTION-BALLOON ONLY  10/31/2013   Procedure: PERCUTANEOUS CORONARY INTERVENTION-BALLOON ONLY;  Surgeon: Jettie Booze, MD;  Location: [facility] CATH LAB;  Service: Cardiovascular;;  . ROTATOR CUFF REPAIR Right 07/2007   Dr. Percell Miller  . ROTATOR CUFF REPAIR Left 11/2004  . TOE AMPUTATION Right 08/2008   3rd toe, Dr. Sharol Given due to osteomyelitis  . VAGINAL HYSTERECTOMY  03/1975  . VEIN LIGATION Bilateral 03/1966  . VENA CAVA FILTER PLACEMENT  01/2010   green filter; "due to blood clots"  . WISDOM TOOTH EXTRACTION  06/2007   Social History   Occupational History  . Retired      from [employer]    Social History Main Topics  . Smoking status: Never Smoker  . Smokeless tobacco: Never Used     Comment: tried for a few months in college.   . Alcohol use No  . Drug use: No  . Sexual activity: No

## 2016-12-14 NOTE — Progress Notes (Signed)
Subjective:   [name] is a 82 y.o. female who presents for Medicare Annual (Subsequent) preventive examination. The patient is accompanied today by her grandson, Vonna Kotyk.  She had cardiac surgery in December 2017 in [place], [place] at [facility].  She then had toe amputation 11/22/16 at [facility], done by Dr. Sharol Given.  She is in need of a new wheelchair because the brakes on her current chair are not functioning well. She has had a few episodes of the chair sliding out from beneath her when she tries to sit down. Fortunately she has not sustained any injuries from this.  Review of Systems:  No ROS.  Medicare Wellness Visit.  Cardiac Risk Factors include: advanced age (>45mn, >>23women);diabetes mellitus;dyslipidemia;hypertension;sedentary lifestyle  Sleep patterns: feels rested on waking, gets up 0 times nightly to void and sleeps 5 hours nightly.   Home Safety/Smoke Alarms: Feels safe in home. Smoke alarms in place.  Living environment; residence and Firearm Safety: [place]. Her home here has too many stairs to navigate given that she is currently wheelchair bound. She does not feel that a ramp would be helpful at this time. She prefers to continue living with her son and hopes that she may be able to start PT and start walking again in the next few months.  1-story house/ trailer, number of outside stairs: 10, equipment: Hygiene Aides, Type: Raised Toilet Seat, wheelchair.  Counseling:   Eye Exam- Dr. DBing Plumeyearly. Wearing glasses.  Female:   Pap- Aged out.     Mammo- last 07/27/15. BI-RADS Category 1: Negative       Dexa scan- last 03/24/14. Osteoporosis.       CCS- last 12/29/15. Internal/external hemorrhoids, 3 polyps removed, diverticulosis. No recall due to age.     Objective:     Vitals: BP 108/60   Pulse 75   Ht _0  (1.6 m)   SpO2 98%   BMI 30.11 kg/m   Body mass index is 30.11  kg/m.  Wt Readings from Last 3 Encounters:  12/14/16 170 lb (77.1 kg)  11/22/16 170 lb (77.1 kg)  09/18/16 170 lb (77.1 kg)    BP Readings from Last 3 Encounters:  12/14/16 108/60  11/22/16 100/62  11/13/16 125/73   Tobacco History  Smoking Status  . Never Smoker  Smokeless Tobacco  . Never Used    Comment: tried for a few months in college.      Counseling given: Not Answered   Past Medical History:  Diagnosis Date  . Abnormal echocardiogram    09/2013: mild LVH, mild focal basal hypertrophy of septum, EF 60-65%, normal WM, grade 1 diastolic dysfunction, MAC, mild LAE, ASA, PASP 34. Possible oscillating MV density - reviewed by MD and felt there was no significant abnormality other than MAC noted with mitral valve and did not require TEE.  . Adenomatous colon polyp   . Allergic rhinitis   . Arthritis    "back; ankles; hands; knees" (10/31/2013)  . Bilateral sensorineural hearing loss   . Bladder infection    frequently  . CAD (coronary artery disease)    a. Nonobst in 2011. b. Abnormal nuc 09/2013 -> s/p cutting balloon to D2, mild LAD disease; c. 08/2015 MV: no ischemia, EF 71%.  . [place]    cancerous polyps  . Carcinoma in situ in a polyp 1994   a. 1994 - malignant polyp  removed during colonoscopy.  . Charcot's Foot    a.  01/2016 s/p RLE transtibial amputation 2/2 Charcot rocker-bottom deformity and insensate neuropathy ulceration.  . Chronic diastolic CHF (congestive heart failure) ([place])    a. 09/2013 Echo: EF 60-65%, mild LVH, Gr1 DD.  Marland Kitchen Diverticulosis   . DJD (degenerative joint disease)   . DVT (deep venous thrombosis) ([place]) 12/2007  . Dyspnea    a. Chronic, extensive w/u see OV note 09-2010. b. [place] 2011: ormal with RA 5 RV 31/2 PA 27/12 (19) PCW 10 CO normal. No evidence of shunting with sitting up in cath lab. c. CPX 2011: see report. d. Prior fluoro of diaphragm -  R diaphragm elevated at rest but both moved with inspiration.   . Fatty liver   . GERD  (gastroesophageal reflux disease)    a. Hx GERD/esophageal dysmotility followed by Dr. Olevia Perches.   Marland Kitchen Head injury due to trauma   . Headache    Optic migraine  . Heart attack    2017  . Hemorrhoids   . Hiatal hernia    a. s/p Nissen fundoplication 7824.  Marland Kitchen Hyperlipidemia    a. patient unwilling to use statins.  . Hypertensive heart disease   . IBS (irritable bowel syndrome)   . Macular degeneration 03/2009   Dr. Rosana Hoes  . Macular degeneration   . Myocardial infarction   . Neuropathy ([place])    a. Hands, feet, legs.  . Orthostatic hypotension   . Osteomyelitis ([place])    a. 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 01/2016 s/p RLE transtibial amputation 2/2 Charcot rocker-bottom deformity and insensate neuropathy ulceration.  . Osteoporosis   . PE (pulmonary embolism) 12/2007   a. PE/DVT after neck surgery 2009. b. coumadin d/c 10-2008.  Marland Kitchen Pneumonia 2015ish  . PONV (postoperative nausea and vomiting) 2009   neck surgery  . PPD positive   . Pulmonary nodule    incidental per CT:  Pet scan 4-9: likely benign, CT 08-2009 no change, no further CTs (Dr. Gwenette Greet)  . Recurrent UTI   . RSD (reflex sympathetic dystrophy)    a. Chronic pain.  Marland Kitchen Spinal stenosis   . Type II diabetes mellitus (HCC)    no on medication  . Venous insufficiency    a. Contributing to LEE.   Past Surgical History:  Procedure Laterality Date  . AMPUTATION  03/06/2012   Procedure: AMPUTATION FOOT;  Surgeon: Newt Minion, MD;  Location: [facility];  Service: Orthopedics;  Laterality: Left;  FIFTH RAY AMPUTATION   . AMPUTATION Right 02/18/2016   Procedure: AMPUTATION BELOW KNEE;  Surgeon: Newt Minion, MD;  Location: [facility];  Service: Orthopedics;  Laterality: Right;  . AMPUTATION Left 11/22/2016   Procedure: Amputation 4th Toe Left Foot at Metatarsophalangeal Joint;  Surgeon: Newt Minion, MD;  Location: [place];  Service: Orthopedics;  Laterality: Left;  . ANKLE FUSION   09/27/2012   Procedure: ANKLE FUSION;  Surgeon: Newt Minion, MD;  Location: [place];  Service: Orthopedics;  Laterality: Left;  Left Tibiocalcaneal Fusion  . ANKLE FUSION Right 05/09/2013   Procedure: ANKLE FUSION;  Surgeon: Newt Minion, MD;  Location: [facility];  Service: Orthopedics;  Laterality: Right;  Excision Osteomyelitis Base 1st MT Right Foot, Fusion Medial Column  . ANKLE SURGERY Left apr & june 1991  . ANTERIOR CERVICAL DECOMP/DISCECTOMY FUSION  12/18/07   For OA,  Dr. Lorin Mercy:  fu by a PE  .  CARDIAC CATHETERIZATION  05/2010    at [facility]  . CARPAL TUNNEL RELEASE Left 09/2011  . CATARACT EXTRACTION W/ INTRAOCULAR LENS  IMPLANT, BILATERAL  2004   feb 2004 left, aug 2004 right  . CHOLECYSTECTOMY  03/2002  . COLONOSCOPY     numerous times  . CORONARY ANGIOPLASTY  10/31/2013  . CORONARY ARTERY BYPASS GRAFT  09/27/2016  . CYST REMOVAL HAND  06/2003  . FOOT BONE EXCISION Right 06/2009  . LAPAROSCOPIC RIGHT HEMI COLECTOMY N/A 01/08/2015   Procedure: LAPAROSCOPIC ASSISTED RIGHT HEMI COLECTOMY;  Surgeon: Johnathan Hausen, MD;  Location: [facility];  Service: General;  Laterality: N/A;  . LEFT HEART CATHETERIZATION WITH CORONARY ANGIOGRAM N/A 10/31/2013   Procedure: LEFT HEART CATHETERIZATION WITH CORONARY ANGIOGRAM;  Surgeon: Jettie Booze, MD;  Location: [facility] CATH LAB;  Service: Cardiovascular;  Laterality: N/A;  . NASAL SEPTUM SURGERY  09/1963  . NISSEN FUNDOPLICATION  05/24/9561  . PERCUTANEOUS CORONARY INTERVENTION-BALLOON ONLY  10/31/2013   Procedure: PERCUTANEOUS CORONARY INTERVENTION-BALLOON ONLY;  Surgeon: Jettie Booze, MD;  Location: [facility] CATH LAB;  Service: Cardiovascular;;  . ROTATOR CUFF REPAIR Right 07/2007   Dr. Percell Miller  . ROTATOR CUFF REPAIR Left 11/2004  . TOE AMPUTATION Right 08/2008   3rd toe, Dr. Sharol Given due to osteomyelitis  . VAGINAL HYSTERECTOMY  03/1975  . VEIN LIGATION Bilateral 03/1966  . VENA CAVA FILTER PLACEMENT  01/2010   green filter; "due to blood clots"  . WISDOM TOOTH  EXTRACTION  06/2007   Family History  Problem Relation Age of Onset  . Heart disease Mother     mitral valve replaced  . Arthritis Mother   . Diabetic kidney disease Daughter   . Diabetes Father   . Stroke Father   . Migraines Sister   . Hypertension    . Breast cancer    . Headache Sister   . Colon cancer Neg Hx   . Esophageal cancer Neg Hx   . Stomach cancer Neg Hx   . Rectal cancer Neg Hx    History  Sexual Activity  . Sexual activity: No    Outpatient Encounter Prescriptions as of 12/14/2016  Medication Sig  . Alpha-Lipoic Acid 600 MG CAPS Take 600 mg by mouth 2 (two) times daily.  Marland Kitchen aspirin 81 MG tablet Take 81 mg by mouth daily.  Marland Kitchen atorvastatin (LIPITOR) 40 MG tablet Take 40 mg by mouth at bedtime.   . carvedilol (COREG) 25 MG tablet Take 25 mg by mouth 2 (two) times daily with a meal.  . clopidogrel (PLAVIX) 75 MG tablet Take 75 mg by mouth at bedtime.   . Cyanocobalamin (VITAMIN B-12) 500 MCG SUBL Place 500 mcg under the tongue daily.  . diclofenac sodium (VOLTAREN) 1 % GEL Apply 1 application topically 4 (four) times daily as needed (pain).   . furosemide (LASIX) 20 MG tablet Take 20 mg by mouth daily.  Marland Kitchen HYDROcodone-acetaminophen (NORCO) 10-325 MG tablet Take 1 tablet by mouth every 6 (six) hours as needed for moderate pain.  . Lancets (FREESTYLE) lancets Check blood sugar twice daily (Patient taking differently: 1 each by Other route daily. )  . meclizine (ANTIVERT) 25 MG tablet Take 1 tablet (25 mg total) by mouth 2 (two) times daily as needed for dizziness.  . Multiple Vitamins-Minerals (PRESERVISION AREDS 2) CAPS Take 1 capsule by mouth 2 (two) times daily.  . nabumetone (RELAFEN) 750 MG tablet Take 750 mg by mouth 2 (two) times daily.  . nitroGLYCERIN (NITROSTAT) 0.4  MG SL tablet Place 0.4 mg under the tongue every 5 (five) minutes as needed for chest pain (x 3 doses). Reported on 04/14/2016  . omeprazole (PRILOSEC) 20 MG capsule Take 1 capsule (20 mg total) by  mouth 2 (two) times daily.  . ondansetron (ZOFRAN ODT) 4 MG disintegrating tablet Take 1 tablet (4 mg total) by mouth every 8 (eight) hours as needed for nausea or vomiting.  Marland Kitchen OVER THE COUNTER MEDICATION Take 1 capsule by mouth 2 (two) times daily. Integrative Digestive Formula  . OVER THE COUNTER MEDICATION 1 tablet 3 (three) times daily. Berberorine Gluco Defense   . Polyvinyl Alcohol-Povidone (REFRESH OP) Place 1 drop into both eyes 3 (three) times daily as needed (dry eyes).   . [DISCONTINUED] clindamycin (CLEOCIN) 300 MG capsule take 1 capsule by mouth three times a day  . [DISCONTINUED] HYDROcodone-acetaminophen (NORCO) 10-325 MG tablet Take 1 tablet by mouth every 6 (six) hours as needed for moderate pain.  Marland Kitchen acetaminophen (TYLENOL) 500 MG tablet Take 500-1,000 mg by mouth every 6 (six) hours as needed (for pain.).  Marland Kitchen Cholecalciferol (VITAMIN D3) 5000 UNITS CAPS Take 5,000 Units by mouth every evening. Reported on 11/24/2015  . Cinnamon 500 MG TABS Take 500 mg by mouth 2 (two) times daily.   Marland Kitchen HYDROcodone-acetaminophen (NORCO) 5-325 MG tablet Take 1 tablet by mouth every 6 (six) hours as needed.  Marland Kitchen HYDROcodone-acetaminophen (NORCO) 5-325 MG tablet Take 1 tablet by mouth every 6 (six) hours as needed.  Marland Kitchen ibuprofen (ADVIL,MOTRIN) 200 MG tablet Take 200-400 mg by mouth every 8 (eight) hours as needed (for pain.).  . [DISCONTINUED] HYDROcodone-acetaminophen (NORCO) 5-325 MG tablet Take 1 tablet by mouth every 6 (six) hours as needed. (Patient not taking: Reported on 12/14/2016)  . [DISCONTINUED] lisinopril (PRINIVIL,ZESTRIL) 5 MG tablet Take 2.5 mg by mouth daily.   . [DISCONTINUED] oseltamivir (TAMIFLU) 75 MG capsule Take 1 capsule (75 mg total) by mouth daily. (Patient not taking: Reported on 12/14/2016)   No facility-administered encounter medications on file as of 12/14/2016.     Activities of Daily Living In your present state of health, do you have any difficulty performing the following  activities: 12/14/2016 11/22/2016  Hearing? N N  Vision? N N  Difficulty concentrating or making decisions? N N  Walking or climbing stairs? Y -  Dressing or bathing? Y N  Doing errands, shopping? Y -  Conservation officer, nature and eating ? Y -  Using the Toilet? N -  In the past six months, have you accidently leaked urine? N -  Do you have problems with loss of bowel control? N -  Managing your Medications? Y -  Managing your Finances? Y -  Housekeeping or managing your Housekeeping? Y -  Some recent data might be hidden    Patient Care Team: Colon Branch, MD as PCP - General Dorothy Spark, MD as Consulting Physician (Cardiology) Melvenia Beam, MD as Consulting Physician (Neurology) Calvert Cantor, MD as Consulting Physician (Ophthalmology) Newt Minion, MD as Consulting Physician (Orthopedic Surgery) Elmore Guise, MD as Consulting Physician (Vascular Surgery) Janyth Contes, MD as Consulting Physician (Cardiology)    Assessment:    Physical assessment deferred to PCP.  Exercise Activities and Dietary recommendations Current Exercise Habits: The patient does not participate in regular exercise at present. She is wheelchair bound recovering from multiple surgeries, including BKA and toe amputation. She hopes to be able to start walking with her prosthesis this year.  Diet (meal preparation, eat out,  water intake, caffeinated beverages, dairy products, fruits and vegetables): in general, a "healthy" diet  , on average, 2 meals per day. Has had poor appetite recently since having multiple surgeries and flu, but appetite is starting to increase. Regular diet, tries to avoid white carbs and concentrated sweets. Drinks water and tea. 2-3 16 oz glasses of water daily.  Goals    . Increase mobility          Start walking again.      Fall Risk Fall Risk  12/14/2016 09/18/2016 10/15/2015 06/11/2015 10/06/2014  Falls in the past year? Yes Exclusion - non ambulatory Yes No No  Number falls  in past yr: 2 or more - 1 - -  Injury with Fall? No - Yes - -  Risk Factor Category  High Fall Risk - - - -  Risk for fall due to : Impaired balance/gait;Impaired mobility;History of fall(s) - - - -  Follow up Falls evaluation completed;Falls prevention discussed - Falls evaluation completed;Falls prevention discussed - -   Depression Screen PHQ 2/9 Scores 12/14/2016 09/18/2016 06/11/2015 10/06/2014  PHQ - 2 Score 0 0 0 0     Cognitive Function MMSE - Mini Mental State Exam 12/14/2016  Orientation to time 5  Orientation to Place 5  Registration 3  Attention/ Calculation 5  Recall 2  Language- name 2 objects 2  Language- repeat 1  Language- follow 3 step command 3  Language- read & follow direction 1  Write a sentence 1  Copy design 1  Total score 29        Immunization History  Administered Date(s) Administered  . Influenza Split 08/07/2012  . Influenza Whole 07/22/2008, 07/26/2009  . Influenza, High Dose Seasonal PF 10/15/2015, 09/18/2016  . Influenza, Seasonal, Injecte, Preservative Fre 07/23/2013  . Influenza,inj,Quad PF,36+ Mos 10/06/2014  . Pneumococcal Conjugate-13 10/06/2014  . Pneumococcal Polysaccharide-23 09/28/2010  . Tetanus 09/17/2013  . Zoster 09/18/2011   Screening Tests Health Maintenance  Topic Date Due  . OPHTHALMOLOGY EXAM  06/01/2016  . HEMOGLOBIN A1C  03/18/2017  . MAMMOGRAM  07/26/2017  . COLONOSCOPY  12/28/2020  . TETANUS/TDAP  09/18/2023  . INFLUENZA VACCINE  Completed  . DEXA SCAN  Completed  . PNA vac Low Risk Adult  Completed      Plan:    Follow-up w/ PCP as directed.  Bring a copy of your advance directives to your next office visit.  Eye exam requested from Dr. Rachael Fee office.  During the course of the visit the patient was educated and counseled about the following appropriate screening and preventive services:   Vaccines to include Pneumoccal, Influenza, Hepatitis B, Td, Zostavax, HCV  Cardiovascular Disease  Colorectal  cancer screening  Bone density screening  Diabetes screening  Glaucoma screening  Mammography  Nutrition counseling   Patient Instructions (the written plan) was given to the patient.   Dorrene German, RN  12/14/2016  [name], MD

## 2016-12-14 NOTE — Progress Notes (Signed)
Pre visit review using our clinic review tool, if applicable. No additional management support is needed unless otherwise documented below in the visit note. 

## 2016-12-14 NOTE — Patient Instructions (Signed)
GO TO THE LAB : Get the blood work     GO TO THE FRONT DESK Schedule your next appointment for a  routine checkup in 3 months  You are getting  enough hydrocodone for the next 3 months.  Check the  blood pressure 2 or 3 times a  week  Be sure your blood pressure is between 110/65 and  145/85. If it is consistently higher or lower, let your heart doctor or me  know

## 2016-12-14 NOTE — Assessment & Plan Note (Addendum)
Currently living in [place] with her son, here with her grandson, she is visiting [place]. DM: Diet control, check A1c HTN: lisinopril  was d/c due to low blood pressure. Continue, carvedilol. BP today slightly low. CAD: Status post a recent CABG, saw cardiology yesterday, felt to be stable. Continue with Lasix, Plavix, carvedilol, Lipitor, aspirin. Checking a CMP, CBC Hyperlipidemia: Continue Lipitor, check a FLP Lung mass: Recently a CT show a spiculated mass at the RML, this is not a new finding, nevertheless I recommend her to call me when she comes back to town, will set up an appointment to see pulmonology, needs to bring the actual CD from the most recent CT for comparison. MSK: --Needs new shoes, recommend to get a prescription for Dr. Sharol Given. --Needs a new wheelchair, the one she has is  uncomfortable and she had a fall recently. Rec to contact the equipment provider and see if she can get more suitable chair. I'll be willing to write a prescription for it. RSD--Pain management: Taking hydrocodone 10 mg 4 times a day rx by me, (surgery rx hydrocodone 5 mg, she did not get that filled). Not taking nortriptyline. Is very taxing for the patient to come to the office, 3 RXs for hydrocodone 10 mg issued, enough for the next 3 months, I won't be able to issue new prescriptions within the next 3 months. Patient aware. She takes Relafen chronically, prescribed elsewhere, apparently without side effects. Also takes occasionally NSAIDs OTC, encouraged to take that very sporadically. RTC 3 months

## 2016-12-15 ENCOUNTER — Telehealth: Payer: Self-pay | Admitting: Cardiology

## 2016-12-15 NOTE — Telephone Encounter (Signed)
New message   Pt daughter would like to know if anyone has received the paperwork sent by her mothers cardiologist in [place]. If not, she would like to see what she can do about getting the papers resent.

## 2016-12-15 NOTE — Telephone Encounter (Signed)
Daughter aware I do not see any records from the cardiologists in [facility] scanned into our system and that Karlene Einstein is not in the office to ask.  Daughter is aware I will forward this to [facility] for her to f/u on when she returns to work.  Daughter states understanding.

## 2016-12-18 NOTE — Telephone Encounter (Signed)
Pts Daughter is calling to inform that the pts Cardiologist from [facility] will soon be faxing her records within their care, to Dr Meda Coffee, for the pt will re-establish with her in June 2018.  Gave the daughter a good fax number to have them send these records too at [phone], attention to [facility] and Dr Meda Coffee.  Daughter reports that the pt is doing very well, and will soon be moving back to [facility], where she will re-establish her cardiac care with Dr Meda Coffee in early June.  Daughter gracious for all the assistance provided.

## 2016-12-20 ENCOUNTER — Ambulatory Visit: Payer: Medicare Other | Admitting: Cardiology

## 2016-12-29 ENCOUNTER — Other Ambulatory Visit (INDEPENDENT_AMBULATORY_CARE_PROVIDER_SITE_OTHER): Payer: Self-pay | Admitting: Orthopedic Surgery

## 2016-12-30 DIAGNOSIS — E1159 Type 2 diabetes mellitus with other circulatory complications: Secondary | ICD-10-CM | POA: Diagnosis not present

## 2016-12-30 DIAGNOSIS — E1140 Type 2 diabetes mellitus with diabetic neuropathy, unspecified: Secondary | ICD-10-CM | POA: Diagnosis not present

## 2016-12-30 DIAGNOSIS — I739 Peripheral vascular disease, unspecified: Secondary | ICD-10-CM | POA: Diagnosis not present

## 2017-01-05 ENCOUNTER — Other Ambulatory Visit: Payer: Self-pay | Admitting: Internal Medicine

## 2017-01-06 DIAGNOSIS — L89891 Pressure ulcer of other site, stage 1: Secondary | ICD-10-CM | POA: Diagnosis not present

## 2017-01-08 DIAGNOSIS — Z1231 Encounter for screening mammogram for malignant neoplasm of breast: Secondary | ICD-10-CM | POA: Diagnosis not present

## 2017-01-08 DIAGNOSIS — M810 Age-related osteoporosis without current pathological fracture: Secondary | ICD-10-CM | POA: Diagnosis not present

## 2017-01-08 LAB — HM MAMMOGRAPHY

## 2017-01-08 LAB — HM DEXA SCAN

## 2017-01-09 DIAGNOSIS — M170 Bilateral primary osteoarthritis of knee: Secondary | ICD-10-CM | POA: Diagnosis not present

## 2017-01-09 DIAGNOSIS — L304 Erythema intertrigo: Secondary | ICD-10-CM | POA: Diagnosis not present

## 2017-01-09 DIAGNOSIS — M19011 Primary osteoarthritis, right shoulder: Secondary | ICD-10-CM | POA: Diagnosis not present

## 2017-01-09 DIAGNOSIS — L720 Epidermal cyst: Secondary | ICD-10-CM | POA: Diagnosis not present

## 2017-01-11 ENCOUNTER — Encounter: Payer: Self-pay | Admitting: Internal Medicine

## 2017-01-12 ENCOUNTER — Ambulatory Visit: Payer: Medicare Other | Admitting: Gastroenterology

## 2017-01-12 DIAGNOSIS — I25728 Atherosclerosis of autologous artery coronary artery bypass graft(s) with other forms of angina pectoris: Secondary | ICD-10-CM | POA: Diagnosis not present

## 2017-01-16 DIAGNOSIS — S8011XA Contusion of right lower leg, initial encounter: Secondary | ICD-10-CM | POA: Diagnosis not present

## 2017-01-16 DIAGNOSIS — N390 Urinary tract infection, site not specified: Secondary | ICD-10-CM | POA: Diagnosis not present

## 2017-01-25 ENCOUNTER — Telehealth (INDEPENDENT_AMBULATORY_CARE_PROVIDER_SITE_OTHER): Payer: Self-pay | Admitting: Orthopedic Surgery

## 2017-01-25 NOTE — Telephone Encounter (Signed)
Do we have any of this paperwork?

## 2017-01-25 NOTE — Telephone Encounter (Signed)
Patient called advised Phoebe Sharps [facility]  with [facility] faxed requested information on 12/29/16 and again on 01/16/17 to Dr Sharol Given for her diabetic shoes. I spoke with Bea at  [facility] and she is faxing the information again. The number to contact Bea is [phone]

## 2017-01-26 NOTE — Telephone Encounter (Signed)
Yes, it was faxed 4/5. I will give to Dr. Sharol Given to sign

## 2017-01-29 ENCOUNTER — Telehealth: Payer: Self-pay | Admitting: Internal Medicine

## 2017-01-29 ENCOUNTER — Encounter: Payer: Self-pay | Admitting: Internal Medicine

## 2017-01-29 NOTE — Telephone Encounter (Signed)
Advise patient, non-density test from 01/08/2017 show a T score of -3.3, slightly worse than before (-3.0). Previously, [name] did not desire to take Fosamax due to 2 family members had a reaction. Recommend a trial with Prolia. If [name] is reluctant, recommend to set an appointment with me to discuss. Also we could refer her to endocrinology if [name] so desires. Continue calcium and vitamin D.

## 2017-01-29 NOTE — Telephone Encounter (Signed)
Pt lives part-time w/ son in [place], do not have son's number in chart. Letter printed and mailed to Pt.

## 2017-01-31 DIAGNOSIS — M79606 Pain in leg, unspecified: Secondary | ICD-10-CM | POA: Diagnosis not present

## 2017-01-31 DIAGNOSIS — I83893 Varicose veins of bilateral lower extremities with other complications: Secondary | ICD-10-CM | POA: Diagnosis not present

## 2017-01-31 DIAGNOSIS — I872 Venous insufficiency (chronic) (peripheral): Secondary | ICD-10-CM | POA: Diagnosis not present

## 2017-02-01 ENCOUNTER — Ambulatory Visit (INDEPENDENT_AMBULATORY_CARE_PROVIDER_SITE_OTHER): Payer: Medicare Other | Admitting: Gastroenterology

## 2017-02-01 ENCOUNTER — Encounter: Payer: Self-pay | Admitting: Gastroenterology

## 2017-02-01 VITALS — BP 118/72 | HR 62 | Ht 64.5 in

## 2017-02-01 DIAGNOSIS — K625 Hemorrhage of anus and rectum: Secondary | ICD-10-CM | POA: Diagnosis not present

## 2017-02-01 DIAGNOSIS — Z86010 Personal history of colonic polyps: Secondary | ICD-10-CM | POA: Diagnosis not present

## 2017-02-01 DIAGNOSIS — I2510 Atherosclerotic heart disease of native coronary artery without angina pectoris: Secondary | ICD-10-CM | POA: Diagnosis not present

## 2017-02-01 DIAGNOSIS — K5902 Outlet dysfunction constipation: Secondary | ICD-10-CM

## 2017-02-01 DIAGNOSIS — R159 Full incontinence of feces: Secondary | ICD-10-CM

## 2017-02-01 DIAGNOSIS — N3020 Other chronic cystitis without hematuria: Secondary | ICD-10-CM | POA: Diagnosis not present

## 2017-02-01 DIAGNOSIS — R3915 Urgency of urination: Secondary | ICD-10-CM | POA: Diagnosis not present

## 2017-02-01 DIAGNOSIS — R8271 Bacteriuria: Secondary | ICD-10-CM | POA: Diagnosis not present

## 2017-02-01 DIAGNOSIS — K648 Other hemorrhoids: Secondary | ICD-10-CM

## 2017-02-01 MED ORDER — LINACLOTIDE 72 MCG PO CAPS: 72.0000 ug | ORAL_CAPSULE | Freq: Every day | ORAL | 3 refills | Status: DC

## 2017-02-01 MED ORDER — HYDROCORTISONE ACETATE 25 MG RE SUPP: 25.0000 mg | Freq: Two times a day (BID) | RECTAL | 2 refills | Status: DC

## 2017-02-01 NOTE — Progress Notes (Signed)
[name]    [id number]    [birth year]  Primary Care Physician:Jose Larose Kells, MD  Referring Physician: Colon Branch, MD [address] [place], [place] [address]  Chief complaint:  Fecal Incontinence  HPI: 82 year old female here for follow-up visit, she was last seen in office 11/24/2015. In the interim patient had below knee amputation of right leg for osteomyelitis. She was admitted in [place] with unstable angina, non-ST elevation MI and had CABG. CHF with EF 40-45%. Subsequently she had left fourth toe amputation for osteomyelitis with history of charcots foot. She is in wheelchair, not ambulating much. She wears a prosthetic leg but hasn't been walking with it. She is constipated with no bowel movement for a week followed by leakage of formed stool with urgency. She can't make it to bathroom on most days. Denies any nausea, vomiting, diarrhea, abdominal pain or melena. She has small volume intermittent bright red blood per rectum when she wipes, she had hemorrhoidal band ligation x1 prior to decline in her health and admission to hospital with unstable angina. Colonoscopy 12/29/2015 with removal of 3 small polyps (tubular adenomas), internal hemorrhoids and left-sided diverticulosis   Outpatient Encounter Prescriptions as of 02/01/2017  Medication Sig  . Alpha-Lipoic Acid 600 MG CAPS Take 600 mg by mouth 2 (two) times daily.  Marland Kitchen aspirin 81 MG tablet Take 81 mg by mouth daily.  Marland Kitchen atorvastatin (LIPITOR) 40 MG tablet Take 40 mg by mouth at bedtime.   . carvedilol (COREG) 25 MG tablet Take 25 mg by mouth 2 (two) times daily with a meal.  . Cholecalciferol (VITAMIN D3) 5000 UNITS CAPS Take 5,000 Units by mouth every evening. Reported on 11/24/2015  . Cinnamon 500 MG TABS Take 500 mg by mouth 2 (two) times daily.   . clopidogrel (PLAVIX) 75 MG tablet Take 75 mg by mouth at bedtime.   . Cyanocobalamin (VITAMIN B-12) 500 MCG SUBL Place 500 mcg under the tongue daily.    . diclofenac sodium (VOLTAREN) 1 % GEL APPLY 2-4 GRAMS TO AFFECTED AREA 3 TIMES DAILY AS NEEDED  . FREESTYLE LITE test strip CHECK BLOOD SUGAR NO MORE THAN TWICE DAILY  . furosemide (LASIX) 20 MG tablet Take 20 mg by mouth daily.  Marland Kitchen HYDROcodone-acetaminophen (NORCO) 5-325 MG tablet Take 1 tablet by mouth every 6 (six) hours as needed.  Marland Kitchen ibuprofen (ADVIL,MOTRIN) 200 MG tablet Take 200-400 mg by mouth every 8 (eight) hours as needed (for pain.).  Marland Kitchen Lancets (FREESTYLE) lancets Check blood sugar twice daily (Patient taking differently: 1 each by Other route daily. )  . meclizine (ANTIVERT) 25 MG tablet Take 1 tablet (25 mg total) by mouth 2 (two) times daily as needed for dizziness.  . Multiple Vitamins-Minerals (PRESERVISION AREDS 2) CAPS Take 1 capsule by mouth 2 (two) times daily.  . nabumetone (RELAFEN) 750 MG tablet Take 750 mg by mouth 2 (two) times daily.  . nitroGLYCERIN (NITROSTAT) 0.4 MG SL tablet Place 0.4 mg under the tongue every 5 (five) minutes as needed for chest pain (x 3 doses). Reported on 04/14/2016  . omeprazole (PRILOSEC) 20 MG capsule Take 1 capsule (20 mg total) by mouth 2 (two) times daily.  . ondansetron (ZOFRAN ODT) 4 MG disintegrating tablet Take 1 tablet (4 mg total) by mouth every 8 (eight) hours as needed for nausea or vomiting.  Marland Kitchen OVER THE COUNTER MEDICATION Take 1 capsule by mouth 2 (two) times daily.  Integrative Digestive Formula  . OVER THE COUNTER MEDICATION 1 tablet 3 (three) times daily. Berberorine Gluco Defense   . Polyvinyl Alcohol-Povidone (REFRESH OP) Place 1 drop into both eyes 3 (three) times daily as needed (dry eyes).   . hydrocortisone (ANUSOL-HC) 25 MG suppository Place 1 suppository (25 mg total) rectally every 12 (twelve) hours.  Marland Kitchen linaclotide (LINZESS) 72 MCG capsule Take 1 capsule (72 mcg total) by mouth daily before breakfast.  . [DISCONTINUED] acetaminophen (TYLENOL) 500 MG tablet Take 500-1,000 mg by mouth every 6 (six) hours as needed (for  pain.).  . [DISCONTINUED] HYDROcodone-acetaminophen (NORCO) 10-325 MG tablet Take 1 tablet by mouth every 6 (six) hours as needed for moderate pain.  . [DISCONTINUED] HYDROcodone-acetaminophen (NORCO) 5-325 MG tablet Take 1 tablet by mouth every 6 (six) hours as needed.   No facility-administered encounter medications on file as of 02/01/2017.     Allergies as of 02/01/2017 - Review Complete 02/01/2017  Allergen Reaction Noted  . Cymbalta [duloxetine hcl] Swelling 09/25/2012  . Gabapentin Swelling 09/25/2012  . Silicone Hives, Itching, Dermatitis, and Rash   . Metformin and related Other (See Comments) 12/10/2015  . Adhesive [tape] Rash 07/17/2011  . Buprenorphine hcl  10/31/2013  . Cefazolin Hives 03/06/2012  . Ciprofloxacin Other (See Comments) 01/31/2013  . Clorazepate dipotassium Other (See Comments)   . Darifenacin  03/30/2009  . Darifenacin hydrobromide Other (See Comments) 03/30/2009  . Dilaudid [hydromorphone hcl] Other (See Comments) 03/11/2010  . Doxycycline Rash 11/03/2008  . Enablex [darifenacin hydrobromide er] Other (See Comments) 03/06/2012  . Levofloxacin Other (See Comments) 01/08/2014  . Lyrica [pregabalin] Swelling 05/08/2013  . Methadone hcl Other (See Comments)   . Morphine and related Other (See Comments) 10/31/2013  . Pentazocine lactate Other (See Comments) 07/17/2011  . Talwin [pentazocine] Other (See Comments) 12/04/2012    Past Medical History:  Diagnosis Date  . Abnormal echocardiogram    09/2013: mild LVH, mild focal basal hypertrophy of septum, EF 60-65%, normal WM, grade 1 diastolic dysfunction, MAC, mild LAE, ASA, PASP 34. Possible oscillating MV density - reviewed by MD and felt there was no significant abnormality other than MAC noted with mitral valve and did not require TEE.  . Adenomatous colon polyp   . Allergic rhinitis   . Arthritis    "back; ankles; hands; knees" (10/31/2013)  . Bilateral sensorineural hearing loss   . Bladder infection      frequently  . CAD (coronary artery disease)    a. Nonobst in 2011. b. Abnormal nuc 09/2013 -> s/p cutting balloon to D2, mild LAD disease; c. 08/2015 MV: no ischemia, EF 71%.  . Cancer ([place])    cancerous polyps  . Carcinoma in situ in a polyp 1994   a. 1994 - malignant polyp removed during colonoscopy.  . Charcot's Foot    a.  01/2016 s/p RLE transtibial amputation 2/2 Charcot rocker-bottom deformity and insensate neuropathy ulceration.  . Chronic diastolic CHF (congestive heart failure) ([place])    a. 09/2013 Echo: EF 60-65%, mild LVH, Gr1 DD.  Marland Kitchen Diverticulosis   . DJD (degenerative joint disease)   . DVT (deep venous thrombosis) ([place]) 12/2007  . Dyspnea    a. Chronic, extensive w/u see OV note 09-2010. b. [facility] 2011: ormal with RA 5 RV 31/2 PA 27/12 (19) PCW 10 CO normal. No evidence of shunting with sitting up in cath lab. c. CPX 2011: see report. d. Prior fluoro of diaphragm -  R diaphragm elevated at rest but both moved with  inspiration.   . Fatty liver   . GERD (gastroesophageal reflux disease)    a. Hx GERD/esophageal dysmotility followed by Dr. Olevia Perches.   Marland Kitchen Head injury due to trauma   . Headache    Optic migraine  . Heart attack    2017  . Hemorrhoids   . Hiatal hernia    a. s/p Nissen fundoplication 2595.  Marland Kitchen Hyperlipidemia    a. patient unwilling to use statins.  . Hypertensive heart disease   . IBS (irritable bowel syndrome)   . Macular degeneration 03/2009   Dr. Rosana Hoes  . Macular degeneration   . Myocardial infarction   . Neuropathy ([place])    a. Hands, feet, legs.  . Orthostatic hypotension   . Osteomyelitis ([place])    a. 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 01/2016 s/p RLE transtibial amputation 2/2 Charcot rocker-bottom deformity and insensate neuropathy ulceration.  . Osteoporosis   . PE (pulmonary embolism) 12/2007   a. PE/DVT after neck surgery 2009. b. coumadin d/c 10-2008.  Marland Kitchen Pneumonia 2015ish  . PONV  (postoperative nausea and vomiting) 2009   neck surgery  . PPD positive   . Pulmonary nodule    incidental per CT:  Pet scan 4-9: likely benign, CT 08-2009 no change, no further CTs (Dr. Gwenette Greet)  . Recurrent UTI   . RSD (reflex sympathetic dystrophy)    a. Chronic pain.  Marland Kitchen Spinal stenosis   . Type II diabetes mellitus ([facility])    no on medication  . Venous insufficiency    a. Contributing to LEE.    Past Surgical History:  Procedure Laterality Date  . AMPUTATION  03/06/2012   Procedure: AMPUTATION FOOT;  Surgeon: Newt Minion, MD;  Location: [facility];  Service: Orthopedics;  Laterality: Left;  FIFTH RAY AMPUTATION   . AMPUTATION Right 02/18/2016   Procedure: AMPUTATION BELOW KNEE;  Surgeon: Newt Minion, MD;  Location: [facility];  Service: Orthopedics;  Laterality: Right;  . AMPUTATION Left 11/22/2016   Procedure: Amputation 4th Toe Left Foot at Metatarsophalangeal Joint;  Surgeon: Newt Minion, MD;  Location: [facility];  Service: Orthopedics;  Laterality: Left;  . ANKLE FUSION  09/27/2012   Procedure: ANKLE FUSION;  Surgeon: Newt Minion, MD;  Location: [facility];  Service: Orthopedics;  Laterality: Left;  Left Tibiocalcaneal Fusion  . ANKLE FUSION Right 05/09/2013   Procedure: ANKLE FUSION;  Surgeon: Newt Minion, MD;  Location: [facility];  Service: Orthopedics;  Laterality: Right;  Excision Osteomyelitis Base 1st MT Right Foot, Fusion Medial Column  . ANKLE SURGERY Left apr & june 1991  . ANTERIOR CERVICAL DECOMP/DISCECTOMY FUSION  12/18/07   For OA,  Dr. Lorin Mercy:  fu by a PE  . CARDIAC CATHETERIZATION  05/2010    at [facility]  . CARPAL TUNNEL RELEASE Left 09/2011  . CATARACT EXTRACTION W/ INTRAOCULAR LENS  IMPLANT, BILATERAL  2004   feb 2004 left, aug 2004 right  . CHOLECYSTECTOMY  03/2002  . COLONOSCOPY     numerous times  . CORONARY ANGIOPLASTY  10/31/2013  . CORONARY ARTERY BYPASS GRAFT  09/27/2016  . CORONARY ARTERY BYPASS GRAFT  09/2016   in [place].  . CYST REMOVAL HAND  06/2003  . FOOT BONE  EXCISION Right 06/2009  . LAPAROSCOPIC RIGHT HEMI COLECTOMY N/A 01/08/2015   Procedure: LAPAROSCOPIC ASSISTED RIGHT HEMI COLECTOMY;  Surgeon: Johnathan Hausen, MD;  Location: [facility];  Service: General;  Laterality: N/A;  .  LEFT HEART CATHETERIZATION WITH CORONARY ANGIOGRAM N/A 10/31/2013   Procedure: LEFT HEART CATHETERIZATION WITH CORONARY ANGIOGRAM;  Surgeon: Jettie Booze, MD;  Location: [facility] CATH LAB;  Service: Cardiovascular;  Laterality: N/A;  . NASAL SEPTUM SURGERY  09/1963  . NISSEN FUNDOPLICATION  12/29/1827  . PERCUTANEOUS CORONARY INTERVENTION-BALLOON ONLY  10/31/2013   Procedure: PERCUTANEOUS CORONARY INTERVENTION-BALLOON ONLY;  Surgeon: Jettie Booze, MD;  Location: [facility] CATH LAB;  Service: Cardiovascular;;  . ROTATOR CUFF REPAIR Right 07/2007   Dr. Percell Miller  . ROTATOR CUFF REPAIR Left 11/2004  . TOE AMPUTATION Right 08/2008   3rd toe, Dr. Sharol Given due to osteomyelitis  . VAGINAL HYSTERECTOMY  03/1975  . VEIN LIGATION Bilateral 03/1966  . VENA CAVA FILTER PLACEMENT  01/2010   green filter; "due to blood clots"  . WISDOM TOOTH EXTRACTION  06/2007    Family History  Problem Relation Age of Onset  . Heart disease Mother     mitral valve replaced  . Arthritis Mother   . Diabetic kidney disease Daughter   . Diabetes Father   . Stroke Father   . Migraines Sister   . Hypertension    . Breast cancer    . Headache Sister   . Colon cancer Neg Hx   . Esophageal cancer Neg Hx   . Stomach cancer Neg Hx   . Rectal cancer Neg Hx     Social History   Social History  . Marital status: Divorced    Spouse name: N/A  . Number of children: 3  . Years of education: 15   Occupational History  . Retired      from [employer]    Social History Main Topics  . Smoking status: Never Smoker  . Smokeless tobacco: Never Used     Comment: tried for a few months in college.   . Alcohol use No  . Drug use: No  . Sexual activity: No   Other Topics Concern  . Not on file   Social  History Narrative   Daughter lives w/ her and another daughter lives in town   1 son in [place]   Son comes home every 4 weeks to visit   Caffeine use:  Tea 1/day w/ dinner   Coffee occass                Review of systems: Review of Systems  Constitutional: Negative for fever and chills.  HENT: Positive for difficulty hearing   Eyes: Negative for blurred vision.  Respiratory: Negative for cough, shortness of breath and wheezing.   Cardiovascular: Negative for chest pain and palpitations.  Gastrointestinal: as per HPI Genitourinary: Positive for dysuria, urgency, frequency (has UTI being treated) and negative for hematuria.  Musculoskeletal: Positive for myalgias, back pain and joint pain.  Skin: Negative for itching and rash.  Neurological: Negative for dizziness, tremors, focal weakness, seizures and loss of consciousness.  Endo/Heme/Allergies: Positive for seasonal allergies.  Psychiatric/Behavioral: Negative for depression, suicidal ideas and hallucinations.  All other systems reviewed and are negative.   Physical Exam: Vitals:   02/01/17 1024  BP: 118/72  Pulse: 62   There is no height or weight on file to calculate BMI. Gen:      No acute distress, wheelchair bound HEENT:  EOMI, sclera anicteric Neck:     No masses; no thyromegaly Lungs:    Clear to auscultation bilaterally; normal respiratory effort CV:         Regular rate and rhythm; no murmurs  Abd:      + bowel sounds; soft, non-tender; no palpable masses, no distension Ext:   + edema left lower extremity, right prosthetic leg Skin:      Warm and dry; no rash Neuro: alert and oriented x 3 Psych: normal mood and affect  Data Reviewed:  Reviewed labs, radiology imaging, old records and pertinent past GI work up   Assessment and Plan/Recommendations:  82 year old female with history of diabetes, peripheral neuropathy, osteomyelitis status post right below-knee amputation, CAD, recent admission with unstable  angina status post CABG 09/2016 here for follow-up visit with complaints of fecal incontinence  Patient has constipation with incomplete evacuation and fecal seepage and incontinence Try low-dose Linzess 55mg daily to improve constipation Advised patient to use Dulcolax or glycerin suppositories as needed if no bowel movement for > than 3 days Kegel exercises Anusol suppository at bedtime as needed for bleeding internal hemorrhoids  Colorectal cancer screening:high risk with history of carcinoma in situ and polyps . Explained to patient that we usually stop colorectal cancer screening at around age 89 She had 3 small polyps removed , usual recommendation would have been return in 3 years but given her age would not recommend any further surveillance colonoscopy   25 minutes was spent face-to-face with the patient. Greater than 50% of the time used for counseling as well as treatment plan and follow-up of constipation and fecal incontinence. She had multiple questions which were answered to her satisfaction  K. VDenzil Magnuson, MD [phone]-Fri 8a-5p [phone] 5p, weekends, holidays  CC: PColon Branch MD

## 2017-02-01 NOTE — Patient Instructions (Signed)
We have sent Linzess and Anusol suppositories to your pharmacy  You can purchase Dulcolax suppositories for constipation every three days as needed

## 2017-02-05 ENCOUNTER — Other Ambulatory Visit: Payer: Self-pay | Admitting: Gastroenterology

## 2017-02-06 ENCOUNTER — Telehealth (INDEPENDENT_AMBULATORY_CARE_PROVIDER_SITE_OTHER): Payer: Self-pay | Admitting: Orthopedic Surgery

## 2017-02-06 ENCOUNTER — Telehealth: Payer: Self-pay | Admitting: Internal Medicine

## 2017-02-06 NOTE — Telephone Encounter (Deleted)
Caller name: °Relation to pt: °Call back number: °Pharmacy: ° °Reason for call:  ° °

## 2017-02-06 NOTE — Telephone Encounter (Signed)
Caller name: Rosabelle Jupin, Pandora Leiter [phone])    Reason for call:  [facility] [address], [place] [address] [phone] will be faxing over diabetic shoe form to [phone] Attention Kaylyn, patient would like Rx sent,please advise son when form is received

## 2017-02-06 NOTE — Telephone Encounter (Signed)
Patient calling this morning. She is currently in [place], [place] with family. She states she is trying to get diabetic shoes with [facility] with Phoebe Sharps. She states she received paperwork from our office, but due to medicare guidelines a Nurse practitioner cannot sign this. It must be signed by MD or DO. I asked her to please resend if allowable. Often times medicare paperwork for diabetic shoes from endocrinologist or physician treating for diabetes. Not allowable by orthopedics. Karena Addison will call us back to let us know if Dr. Sharol Given can sign. Otherwise she will get patients PCP to sign. Dee with their office has also spoken with patient this morning.

## 2017-02-07 NOTE — Telephone Encounter (Signed)
Spoke w/ Dee at [facility], informed Dr. Larose Kells and I had been on vacation and out of the office for the last 2 days and received forms when we arrived to office this morning. PCP requesting Dr. Sharol Given to complete forms due to extensive foot deformities/amputations, informed by [facility] [place] that Medicare will only accept documentation from PCP. Informed that Pt will need to be seen in office for forms to be completed that foot exam had not been completed at last visit or within last 90 days. Dee informed she will call Pt and inform to call office to schedule appt.

## 2017-02-07 NOTE — Telephone Encounter (Signed)
Patient scheduled for 02/13/2017 at 2:30pm with PCP.

## 2017-02-07 NOTE — Telephone Encounter (Signed)
Forms will need to go to Pt's orthopedist Dr. Sharol Given whom has operated on Pt's feet.

## 2017-02-07 NOTE — Telephone Encounter (Signed)
Noted  

## 2017-02-13 ENCOUNTER — Ambulatory Visit (INDEPENDENT_AMBULATORY_CARE_PROVIDER_SITE_OTHER): Payer: Medicare Other | Admitting: Internal Medicine

## 2017-02-13 ENCOUNTER — Encounter: Payer: Self-pay | Admitting: Internal Medicine

## 2017-02-13 VITALS — BP 124/70 | HR 73 | Temp 97.8°F | Resp 14 | Ht 65.0 in

## 2017-02-13 DIAGNOSIS — I2510 Atherosclerotic heart disease of native coronary artery without angina pectoris: Secondary | ICD-10-CM

## 2017-02-13 DIAGNOSIS — E1142 Type 2 diabetes mellitus with diabetic polyneuropathy: Secondary | ICD-10-CM

## 2017-02-13 MED ORDER — ACCU-CHEK SOFT TOUCH LANCETS MISC: 12 refills | Status: DC

## 2017-02-13 MED ORDER — GLUCOSE BLOOD VI STRP: ORAL_STRIP | 12 refills | Status: DC

## 2017-02-13 MED ORDER — ACCU-CHEK GUIDE CONTROL VI LIQD
1.0000 | Freq: Once | 5 refills | Status: AC
Start: 2017-02-13 — End: 2017-02-13

## 2017-02-13 NOTE — Patient Instructions (Signed)
Please call if the forms need to be edited

## 2017-02-13 NOTE — Progress Notes (Signed)
Subjective:    Patient ID: [name], female    DOB: 1935/07/02, 82 y.o.   MRN: [id number]  DOS:  02/13/2017 Type of visit - description : acute Interval history: Needs form completed.   Review of Systems  Doing about the same, she has no feeling or pain at the left foot, some swelling at the heel, just seen by cardiology, they are requesting a ultrasound to eval the swelling.    Past Medical History:  Diagnosis Date  . Abnormal echocardiogram    09/2013: mild LVH, mild focal basal hypertrophy of septum, EF 60-65%, normal WM, grade 1 diastolic dysfunction, MAC, mild LAE, ASA, PASP 34. Possible oscillating MV density - reviewed by MD and felt there was no significant abnormality other than MAC noted with mitral valve and did not require TEE.  . Adenomatous colon polyp   . Allergic rhinitis   . Arthritis    "back; ankles; hands; knees" (10/31/2013)  . Bilateral sensorineural hearing loss   . Bladder infection    frequently  . CAD (coronary artery disease)    a. Nonobst in 2011. b. Abnormal nuc 09/2013 -> s/p cutting balloon to D2, mild LAD disease; c. 08/2015 MV: no ischemia, EF 71%.  . Cancer ([place])    cancerous polyps  . Carcinoma in situ in a polyp 1994   a. 1994 - malignant polyp removed during colonoscopy.  . Charcot's Foot    a.  01/2016 s/p RLE transtibial amputation 2/2 Charcot rocker-bottom deformity and insensate neuropathy ulceration.  . Chronic diastolic CHF (congestive heart failure) ([place])    a. 09/2013 Echo: EF 60-65%, mild LVH, Gr1 DD.  Marland Kitchen Diverticulosis   . DJD (degenerative joint disease)   . DVT (deep venous thrombosis) ([place]) 12/2007  . Dyspnea    a. Chronic, extensive w/u see OV note 09-2010. b. [place] 2011: ormal with RA 5 RV 31/2 PA 27/12 (19) PCW 10 CO normal. No evidence of shunting with sitting up in cath lab. c. CPX 2011: see report. d. Prior fluoro of diaphragm -  R diaphragm elevated at rest but both moved with inspiration.   . Fatty liver   . GERD  (gastroesophageal reflux disease)    a. Hx GERD/esophageal dysmotility followed by Dr. Olevia Perches.   Marland Kitchen Head injury due to trauma   . Headache    Optic migraine  . Heart attack ([place])    2017  . Hemorrhoids   . Hiatal hernia    a. s/p Nissen fundoplication 2482.  Marland Kitchen Hyperlipidemia    a. patient unwilling to use statins.  . Hypertensive heart disease   . IBS (irritable bowel syndrome)   . Macular degeneration 03/2009   Dr. Rosana Hoes  . Macular degeneration   . Myocardial infarction ([place])   . Neuropathy    a. Hands, feet, legs.  . Orthostatic hypotension   . Osteomyelitis ([place])    a. 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 01/2016 s/p RLE transtibial amputation 2/2 Charcot rocker-bottom deformity and insensate neuropathy ulceration.  . Osteoporosis   . PE (pulmonary embolism) 12/2007   a. PE/DVT after neck surgery 2009. b. coumadin d/c 10-2008.  Marland Kitchen Pneumonia 2015ish  . PONV (postoperative nausea and vomiting) 2009   neck surgery  . PPD positive   . Pulmonary nodule    incidental per CT:  Pet scan 4-9: likely benign, CT 08-2009 no change, no further CTs (Dr. Gwenette Greet)  . Recurrent UTI   .  RSD (reflex sympathetic dystrophy)    a. Chronic pain.  Marland Kitchen Spinal stenosis   . Type II diabetes mellitus ([facility])    no on medication  . Venous insufficiency    a. Contributing to LEE.    Past Surgical History:  Procedure Laterality Date  . AMPUTATION  03/06/2012   Procedure: AMPUTATION FOOT;  Surgeon: Newt Minion, MD;  Location: [facility];  Service: Orthopedics;  Laterality: Left;  FIFTH RAY AMPUTATION   . AMPUTATION Right 02/18/2016   Procedure: AMPUTATION BELOW KNEE;  Surgeon: Newt Minion, MD;  Location: [facility];  Service: Orthopedics;  Laterality: Right;  . AMPUTATION Left 11/22/2016   Procedure: Amputation 4th Toe Left Foot at Metatarsophalangeal Joint;  Surgeon: Newt Minion, MD;  Location: [facility];  Service: Orthopedics;  Laterality: Left;  . ANKLE  FUSION  09/27/2012   Procedure: ANKLE FUSION;  Surgeon: Newt Minion, MD;  Location: [facility];  Service: Orthopedics;  Laterality: Left;  Left Tibiocalcaneal Fusion  . ANKLE FUSION Right 05/09/2013   Procedure: ANKLE FUSION;  Surgeon: Newt Minion, MD;  Location: [facility];  Service: Orthopedics;  Laterality: Right;  Excision Osteomyelitis Base 1st MT Right Foot, Fusion Medial Column  . ANKLE SURGERY Left apr & june 1991  . ANTERIOR CERVICAL DECOMP/DISCECTOMY FUSION  12/18/07   For OA,  Dr. Lorin Mercy:  fu by a PE  . CARDIAC CATHETERIZATION  05/2010    at [facility]  . CARPAL TUNNEL RELEASE Left 09/2011  . CATARACT EXTRACTION W/ INTRAOCULAR LENS  IMPLANT, BILATERAL  2004   feb 2004 left, aug 2004 right  . CHOLECYSTECTOMY  03/2002  . COLONOSCOPY     numerous times  . CORONARY ANGIOPLASTY  10/31/2013  . CORONARY ARTERY BYPASS GRAFT  09/27/2016  . CORONARY ARTERY BYPASS GRAFT  09/2016   in [place].  . CYST REMOVAL HAND  06/2003  . FOOT BONE EXCISION Right 06/2009  . LAPAROSCOPIC RIGHT HEMI COLECTOMY N/A 01/08/2015   Procedure: LAPAROSCOPIC ASSISTED RIGHT HEMI COLECTOMY;  Surgeon: Johnathan Hausen, MD;  Location: [facility];  Service: General;  Laterality: N/A;  . LEFT HEART CATHETERIZATION WITH CORONARY ANGIOGRAM N/A 10/31/2013   Procedure: LEFT HEART CATHETERIZATION WITH CORONARY ANGIOGRAM;  Surgeon: Jettie Booze, MD;  Location: [facility] CATH LAB;  Service: Cardiovascular;  Laterality: N/A;  . NASAL SEPTUM SURGERY  09/1963  . NISSEN FUNDOPLICATION  10/29/735  . PERCUTANEOUS CORONARY INTERVENTION-BALLOON ONLY  10/31/2013   Procedure: PERCUTANEOUS CORONARY INTERVENTION-BALLOON ONLY;  Surgeon: Jettie Booze, MD;  Location: [facility] CATH LAB;  Service: Cardiovascular;;  . ROTATOR CUFF REPAIR Right 07/2007   Dr. Percell Miller  . ROTATOR CUFF REPAIR Left 11/2004  . TOE AMPUTATION Right 08/2008   3rd toe, Dr. Sharol Given due to osteomyelitis  . VAGINAL HYSTERECTOMY  03/1975  . VEIN LIGATION Bilateral 03/1966  . VENA CAVA FILTER PLACEMENT   01/2010   green filter; "due to blood clots"  . WISDOM TOOTH EXTRACTION  06/2007    Social History   Social History  . Marital status: Divorced    Spouse name: N/A  . Number of children: 3  . Years of education: 15   Occupational History  . Retired      from [employer]    Social History Main Topics  . Smoking status: Never Smoker  . Smokeless tobacco: Never Used     Comment: tried for a few months in college.   . Alcohol use No  . Drug use: No  .  Sexual activity: No   Other Topics Concern  . Not on file   Social History Narrative   Daughter lives w/ her and another daughter lives in town   1 son in [place]   Son comes home every 4 weeks to visit   Caffeine use:  Tea 1/day w/ dinner   Coffee occass                Allergies as of 02/13/2017      Reactions   Cymbalta [duloxetine Hcl] Swelling   Swelling in legs   Gabapentin Swelling   Swelling in legs Swelling in legs   Silicone Hives, Itching, Dermatitis, Rash   Metformin And Related Other (See Comments)   dizzy, tired, chills, diarrhea, and nausea   Adhesive [tape] Rash   rash rash   Cefazolin Hives   Hives Hives   Ciprofloxacin Other (See Comments)   Other reaction(s): Other (See Comments) Per pt, caused body aches Per pt, caused body aches   Clorazepate Dipotassium Other (See Comments)   Unknown reaction   Darifenacin Hydrobromide Other (See Comments)   hypotension, near syncope   Dilaudid [hydromorphone Hcl] Other (See Comments)    confused, intense itching   Doxycycline Rash   Enablex [darifenacin Hydrobromide Er] Other (See Comments)   Hypotension, near syncope   Levofloxacin Other (See Comments)   Causes wrist pain   Lyrica [pregabalin] Swelling   Swelling in legs   Methadone Hcl Other (See Comments)   Reaction unknown   Morphine And Related Other (See Comments)   Confusion, constipation.   Talwin [pentazocine] Other (See Comments)   "climbing walls" anxiety      Medication List        Accurate as of 02/13/17 11:59 PM. Always use your most recent med list.          ACCU-CHEK GUIDE CONTROL Liqd 1 Bottle by Does not apply route once. To use with Accu-chek Guide meter   accu-chek soft touch lancets Check blood sugar no more than twice daily   Alpha-Lipoic Acid 600 MG Caps Take 600 mg by mouth 2 (two) times daily.   aspirin 81 MG tablet Take 81 mg by mouth daily.   atorvastatin 40 MG tablet Commonly known as:  LIPITOR Take 40 mg by mouth at bedtime.   carvedilol 25 MG tablet Commonly known as:  COREG Take 25 mg by mouth 2 (two) times daily with a meal.   Cinnamon 500 MG Tabs Take 500 mg by mouth 2 (two) times daily.   clopidogrel 75 MG tablet Commonly known as:  PLAVIX Take 75 mg by mouth at bedtime.   diclofenac sodium 1 % Gel Commonly known as:  VOLTAREN APPLY 2-4 GRAMS TO AFFECTED AREA 3 TIMES DAILY AS NEEDED   furosemide 20 MG tablet Commonly known as:  LASIX Take 20 mg by mouth daily.   glucose blood test strip Commonly known as:  ACCU-CHEK GUIDE Check blood sugar no more than twice daily   HYDROcodone-acetaminophen 5-325 MG tablet Commonly known as:  NORCO Take 1 tablet by mouth every 6 (six) hours as needed.   hydrocortisone 25 MG suppository Commonly known as:  ANUSOL-HC Place 1 suppository (25 mg total) rectally every 12 (twelve) hours.   ibuprofen 200 MG tablet Commonly known as:  ADVIL,MOTRIN Take 200-400 mg by mouth every 8 (eight) hours as needed (for pain.).   linaclotide 72 MCG capsule Commonly known as:  LINZESS Take 1 capsule (72 mcg total) by mouth daily before  breakfast.   meclizine 25 MG tablet Commonly known as:  ANTIVERT Take 1 tablet (25 mg total) by mouth 2 (two) times daily as needed for dizziness.   nabumetone 750 MG tablet Commonly known as:  RELAFEN Take 750 mg by mouth 2 (two) times daily.   nitroGLYCERIN 0.4 MG SL tablet Commonly known as:  NITROSTAT Place 0.4 mg under the tongue every 5 (five)  minutes as needed for chest pain (x 3 doses). Reported on 04/14/2016   omeprazole 20 MG capsule Commonly known as:  PRILOSEC TAKE 1 CAPSULE BY MOUTH 2 TIMES DAILY   ondansetron 4 MG disintegrating tablet Commonly known as:  ZOFRAN ODT Take 1 tablet (4 mg total) by mouth every 8 (eight) hours as needed for nausea or vomiting.   OVER THE COUNTER MEDICATION 1 tablet 3 (three) times daily. Ber[place]orine Gluco Defense   OVER THE COUNTER MEDICATION Take 1 capsule by mouth 2 (two) times daily. Integrative Digestive Formula   PRESERVISION AREDS 2 Caps Take 1 capsule by mouth 2 (two) times daily.   REFRESH OP Place 1 drop into both eyes 3 (three) times daily as needed (dry eyes).   Vitamin B-12 500 MCG Subl Place 500 mcg under the tongue daily.   Vitamin D3 5000 units Caps Take 5,000 Units by mouth every evening. Reported on 11/24/2015          Objective:   Physical Exam BP 124/70 (BP Location: Left Arm, Patient Position: Sitting, Cuff Size: Small)   Pulse 73   Temp 97.8 F (36.6 C) (Oral)   Resp 14   Ht 5' 5"  (1.651 m)   SpO2 93%  General:   Well developed, well nourished, sitting in a wheelchair, no distress.  HEENT:  Normocephalic . Face symmetric, atraumatic Exam: Right foot absent Left foot: Status post amputation of toe #4 and 5. Well-healed scars. Skin without ulcers. Nails in good condition. Pedal pulses present but decreased. Pinprick examination: Patient has no feeling. Neurologic:  alert & oriented X3.  Speech normal, gait not tested Psych--  Cognition and judgment appear intact.  Cooperative with normal attention span and concentration.  Behavior appropriate. No anxious or depressed appearing.      Assessment & Plan:    Assessment DM, Neuropathy, amputations due to Charcot feet. - metformin intolerant  (lethargic) HTN  Hyperlipidemia-- won't take statins  LUNG MASS , last CT 09-2016 CV: ---CAD  --NSTEMI 09-2016. Outside hospital: Cath 2 vessel  disease, CABG 2, LIMA to LAD and Diag   ---Chronic diastolic CHF GI: GERD, IBS, colon polyps, PUD, abdominal pain "post polypectomy syndrome" Osteoporosis  MSK,pain mngmt : --Reflex sympathetic dystrophy --Neuropathy --DJD --Spinal stenosis  --H/o Osteomyelitis  Foot --s/p amputation:  R - L toes , R BKA  (01-2016, dx osteomyelitis) -- sees Dr Sharol Given prn Takes nortriptyline also for leg cramps. She had local injections with Dr. Maia Petties 2016 w/o apparent help. She took oxycodone after surgery and that did NOT seem to help better than hydrocodone. Intolerant to Cymbalta, Neurontin ; Lyrica helped but caused edema. Recurrent UTIs -- bactrim q d Venous insufficiency  H/o  + PPD H/o hypothyroidism: TSHs stable H/o dyspnea  PLAN:  DM, controlled. Has neuropathy, status post multiple amputations: Patient is  stable, form completed  Today, I spent more than 17   min with the patient: >50% of the time counseling regards neuropathy, coordinating her care.

## 2017-02-13 NOTE — Progress Notes (Signed)
Pre visit review using our clinic review tool, if applicable. No additional management support is needed unless otherwise documented below in the visit note. 

## 2017-02-14 DIAGNOSIS — H04123 Dry eye syndrome of bilateral lacrimal glands: Secondary | ICD-10-CM | POA: Diagnosis not present

## 2017-02-14 DIAGNOSIS — N3020 Other chronic cystitis without hematuria: Secondary | ICD-10-CM | POA: Diagnosis not present

## 2017-02-14 DIAGNOSIS — H01001 Unspecified blepharitis right upper eyelid: Secondary | ICD-10-CM | POA: Diagnosis not present

## 2017-02-14 DIAGNOSIS — H01002 Unspecified blepharitis right lower eyelid: Secondary | ICD-10-CM | POA: Diagnosis not present

## 2017-02-14 DIAGNOSIS — H01004 Unspecified blepharitis left upper eyelid: Secondary | ICD-10-CM | POA: Diagnosis not present

## 2017-02-14 DIAGNOSIS — H524 Presbyopia: Secondary | ICD-10-CM | POA: Diagnosis not present

## 2017-02-14 NOTE — Telephone Encounter (Signed)
Forms completed and faxed w/ OV note from 02/13/2017 to [place] at [phone]. Forms sent for scanning.

## 2017-02-14 NOTE — Assessment & Plan Note (Signed)
DM, controlled. Has neuropathy, status post multiple amputations: Patient is  stable, form completed

## 2017-02-14 NOTE — Telephone Encounter (Signed)
Received fax confirmation on 02/14/2017 at 1302.

## 2017-02-16 DIAGNOSIS — I872 Venous insufficiency (chronic) (peripheral): Secondary | ICD-10-CM | POA: Diagnosis not present

## 2017-03-08 ENCOUNTER — Telehealth: Payer: Self-pay | Admitting: Internal Medicine

## 2017-03-08 ENCOUNTER — Telehealth: Payer: Self-pay | Admitting: Cardiology

## 2017-03-08 NOTE — Telephone Encounter (Signed)
New message       Pt had a heart attack and CABG in dec 2017 while in [place], [place].  Calling to see if we received the hosp notes from that event.  Please call

## 2017-03-08 NOTE — Telephone Encounter (Signed)
Requesting Hydrocodone-ACET 5-325mg -Take 1 tablet by mouth every 6 hours as needed. Last refill:12/14/16;#30,0 Last OV:02/13/17-Pt is scheduled for (03/21/17) UDS:CSC signed on:10/15/15-No UDS done. Please advise.//AB/CMA

## 2017-03-08 NOTE — Telephone Encounter (Signed)
°  Relation to [id number]:[id number] Call back number: [phone] Pharmacy: [facility], [place] [id number] (Phone) [phone] (Fax)    Reason for call:  Patient states she will run out of HYDROcodone-acetaminophen ([place]) 5-325 MG tablet before 5/30 follow up appointment with PCP

## 2017-03-08 NOTE — Telephone Encounter (Signed)
Informed the pts Daughter [name] that we have not received these records yet.  Percival Spanish to have them fax records to our medical records [facility], Dr Meda Coffee, and myself, at [phone]. Daughter verbalized understanding and agrees with this plan.  Daughter gracious for all the assistance provided.

## 2017-03-09 MED ORDER — HYDROCODONE-ACETAMINOPHEN 5-325 MG PO TABS: 1.0000 | ORAL_TABLET | Freq: Four times a day (QID) | ORAL | 0 refills | Status: DC | PRN

## 2017-03-09 NOTE — Telephone Encounter (Signed)
Okay #120, print a prescription, I will sign

## 2017-03-09 NOTE — Telephone Encounter (Signed)
Called and spoke with the pt and informed her that the prescription has been approved and is ready for pick-up.  Pt stated that she is now living out of town in [facility] with her son because of her health.   Pt stated that she would like for her daughter [name] to come and pick-up the prescription for her.   Informed the pt to let her daughter know that I will place the prescription up front for the her to pick-up.  She verbalized understanding and agreed.//AB/CMA

## 2017-03-09 NOTE — Telephone Encounter (Signed)
Patient states she was disconnected due to her phone issues, requesting a call back best [phone] ey did you happen to speak with Renu, [place]

## 2017-03-13 DIAGNOSIS — I70213 Atherosclerosis of native arteries of extremities with intermittent claudication, bilateral legs: Secondary | ICD-10-CM | POA: Diagnosis not present

## 2017-03-16 DIAGNOSIS — R609 Edema, unspecified: Secondary | ICD-10-CM | POA: Diagnosis not present

## 2017-03-21 ENCOUNTER — Ambulatory Visit: Payer: Medicare Other | Admitting: Internal Medicine

## 2017-03-26 DIAGNOSIS — I872 Venous insufficiency (chronic) (peripheral): Secondary | ICD-10-CM | POA: Diagnosis not present

## 2017-03-26 DIAGNOSIS — I83893 Varicose veins of bilateral lower extremities with other complications: Secondary | ICD-10-CM | POA: Diagnosis not present

## 2017-03-26 DIAGNOSIS — M79606 Pain in leg, unspecified: Secondary | ICD-10-CM | POA: Diagnosis not present

## 2017-03-29 DIAGNOSIS — M79606 Pain in leg, unspecified: Secondary | ICD-10-CM | POA: Diagnosis not present

## 2017-03-29 DIAGNOSIS — I83893 Varicose veins of bilateral lower extremities with other complications: Secondary | ICD-10-CM | POA: Diagnosis not present

## 2017-03-29 DIAGNOSIS — I10 Essential (primary) hypertension: Secondary | ICD-10-CM | POA: Diagnosis not present

## 2017-03-29 DIAGNOSIS — I872 Venous insufficiency (chronic) (peripheral): Secondary | ICD-10-CM | POA: Diagnosis not present

## 2017-03-29 DIAGNOSIS — I25728 Atherosclerosis of autologous artery coronary artery bypass graft(s) with other forms of angina pectoris: Secondary | ICD-10-CM | POA: Diagnosis not present

## 2017-03-30 ENCOUNTER — Ambulatory Visit: Payer: Medicare Other | Admitting: Cardiology

## 2017-03-30 ENCOUNTER — Ambulatory Visit (INDEPENDENT_AMBULATORY_CARE_PROVIDER_SITE_OTHER): Payer: Medicare Other | Admitting: Internal Medicine

## 2017-03-30 ENCOUNTER — Encounter: Payer: Self-pay | Admitting: Internal Medicine

## 2017-03-30 VITALS — BP 126/74 | HR 79 | Temp 98.1°F | Resp 14 | Ht 65.0 in | Wt 170.0 lb

## 2017-03-30 DIAGNOSIS — K5909 Other constipation: Secondary | ICD-10-CM

## 2017-03-30 DIAGNOSIS — G546 Phantom limb syndrome with pain: Secondary | ICD-10-CM

## 2017-03-30 DIAGNOSIS — I10 Essential (primary) hypertension: Secondary | ICD-10-CM

## 2017-03-30 DIAGNOSIS — I2510 Atherosclerotic heart disease of native coronary artery without angina pectoris: Secondary | ICD-10-CM

## 2017-03-30 DIAGNOSIS — E1142 Type 2 diabetes mellitus with diabetic polyneuropathy: Secondary | ICD-10-CM | POA: Diagnosis not present

## 2017-03-30 MED ORDER — HYDROCODONE-ACETAMINOPHEN 5-325 MG PO TABS: 1.0000 | ORAL_TABLET | Freq: Four times a day (QID) | ORAL | 0 refills | Status: DC | PRN

## 2017-03-30 MED ORDER — AMITRIPTYLINE HCL 10 MG PO TABS: ORAL_TABLET | ORAL | 1 refills | Status: DC

## 2017-03-30 NOTE — Progress Notes (Signed)
Subjective:    Patient ID: [name], female    DOB: 26-Aug-1935, 82 y.o.   MRN: [id number]  DOS:  03/30/2017 Type of visit - description : rov Interval history: since the last visit, she is doing well. She has a few concerns. Continue with problems with constipation, concerned about side effects and complications from [place]. She asked my opinion. Part of his concern is that sometimes she has diarrhea Recently had varicose ablation by cardiology (in [place].) and doing okay. Chronic pain is relatively well controlled with current medications. She has developed phantom pain, mostly pain at the right toes ( status post right BKA)  Review of Systems   Past Medical History:  Diagnosis Date  . Abnormal echocardiogram    09/2013: mild LVH, mild focal basal hypertrophy of septum, EF 60-65%, normal WM, grade 1 diastolic dysfunction, MAC, mild LAE, ASA, PASP 34. Possible oscillating MV density - reviewed by MD and felt there was no significant abnormality other than MAC noted with mitral valve and did not require TEE.  . Adenomatous colon polyp   . Allergic rhinitis   . Arthritis    "back; ankles; hands; knees" (10/31/2013)  . Bilateral sensorineural hearing loss   . Bladder infection    frequently  . CAD (coronary artery disease)    a. Nonobst in 2011. b. Abnormal nuc 09/2013 -> s/p cutting balloon to D2, mild LAD disease; c. 08/2015 MV: no ischemia, EF 71%.  . Cancer ([place])    cancerous polyps  . Carcinoma in situ in a polyp 1994   a. 1994 - malignant polyp removed during colonoscopy.  . Charcot's Foot    a.  01/2016 s/p RLE transtibial amputation 2/2 Charcot rocker-bottom deformity and insensate neuropathy ulceration.  . Chronic diastolic CHF (congestive heart failure) ([place])    a. 09/2013 Echo: EF 60-65%, mild LVH, Gr1 DD.  Marland Kitchen Diverticulosis   . DJD (degenerative joint disease)   . DVT (deep venous thrombosis) ([place]) 12/2007  . Dyspnea    a. Chronic, extensive w/u see OV note 09-2010. b.  [place] 2011: ormal with RA 5 RV 31/2 PA 27/12 (19) PCW 10 CO normal. No evidence of shunting with sitting up in cath lab. c. CPX 2011: see report. d. Prior fluoro of diaphragm -  R diaphragm elevated at rest but both moved with inspiration.   . Fatty liver   . GERD (gastroesophageal reflux disease)    a. Hx GERD/esophageal dysmotility followed by Dr. Olevia Perches.   Marland Kitchen Head injury due to trauma   . Headache    Optic migraine  . Heart attack ([place])    2017  . Hemorrhoids   . Hiatal hernia    a. s/p Nissen fundoplication 3154.  Marland Kitchen Hyperlipidemia    a. patient unwilling to use statins.  . Hypertensive heart disease   . IBS (irritable bowel syndrome)   . Macular degeneration 03/2009   Dr. Rosana Hoes  . Macular degeneration   . Myocardial infarction ([place])   . Neuropathy    a. Hands, feet, legs.  . Orthostatic hypotension   . Osteomyelitis ([place])    a. 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 01/2016 s/p RLE transtibial amputation 2/2 Charcot rocker-bottom deformity and insensate neuropathy ulceration.  . Osteoporosis   . PE (pulmonary embolism) 12/2007   a. PE/DVT after neck surgery 2009. b. coumadin d/c 10-2008.  Marland Kitchen Pneumonia 2015ish  . PONV (postoperative nausea and vomiting) 2009  neck surgery  . PPD positive   . Pulmonary nodule    incidental per CT:  Pet scan 4-9: likely benign, CT 08-2009 no change, no further CTs (Dr. Gwenette Greet)  . Recurrent UTI   . RSD (reflex sympathetic dystrophy)    a. Chronic pain.  Marland Kitchen Spinal stenosis   . Type II diabetes mellitus ([facility])    no on medication  . Venous insufficiency    a. Contributing to LEE.    Past Surgical History:  Procedure Laterality Date  . AMPUTATION  03/06/2012   Procedure: AMPUTATION FOOT;  Surgeon: Newt Minion, MD;  Location: [facility];  Service: Orthopedics;  Laterality: Left;  FIFTH RAY AMPUTATION   . AMPUTATION Right 02/18/2016   Procedure: AMPUTATION BELOW KNEE;  Surgeon: Newt Minion,  MD;  Location: [facility];  Service: Orthopedics;  Laterality: Right;  . AMPUTATION Left 11/22/2016   Procedure: Amputation 4th Toe Left Foot at Metatarsophalangeal Joint;  Surgeon: Newt Minion, MD;  Location: [facility];  Service: Orthopedics;  Laterality: Left;  . ANKLE FUSION  09/27/2012   Procedure: ANKLE FUSION;  Surgeon: Newt Minion, MD;  Location: [facility];  Service: Orthopedics;  Laterality: Left;  Left Tibiocalcaneal Fusion  . ANKLE FUSION Right 05/09/2013   Procedure: ANKLE FUSION;  Surgeon: Newt Minion, MD;  Location: [facility];  Service: Orthopedics;  Laterality: Right;  Excision Osteomyelitis Base 1st MT Right Foot, Fusion Medial Column  . ANKLE SURGERY Left apr & june 1991  . ANTERIOR CERVICAL DECOMP/DISCECTOMY FUSION  12/18/07   For OA,  Dr. Lorin Mercy:  fu by a PE  . CARDIAC CATHETERIZATION  05/2010    at [facility]  . CARPAL TUNNEL RELEASE Left 09/2011  . CATARACT EXTRACTION W/ INTRAOCULAR LENS  IMPLANT, BILATERAL  2004   feb 2004 left, aug 2004 right  . CHOLECYSTECTOMY  03/2002  . COLONOSCOPY     numerous times  . CORONARY ANGIOPLASTY  10/31/2013  . CORONARY ARTERY BYPASS GRAFT  09/27/2016  . CORONARY ARTERY BYPASS GRAFT  09/2016   in [place].  . CYST REMOVAL HAND  06/2003  . FOOT BONE EXCISION Right 06/2009  . LAPAROSCOPIC RIGHT HEMI COLECTOMY N/A 01/08/2015   Procedure: LAPAROSCOPIC ASSISTED RIGHT HEMI COLECTOMY;  Surgeon: Johnathan Hausen, MD;  Location: [facility];  Service: General;  Laterality: N/A;  . LEFT HEART CATHETERIZATION WITH CORONARY ANGIOGRAM N/A 10/31/2013   Procedure: LEFT HEART CATHETERIZATION WITH CORONARY ANGIOGRAM;  Surgeon: Jettie Booze, MD;  Location: [facility] CATH LAB;  Service: Cardiovascular;  Laterality: N/A;  . NASAL SEPTUM SURGERY  09/1963  . NISSEN FUNDOPLICATION  0/0/9233  . PERCUTANEOUS CORONARY INTERVENTION-BALLOON ONLY  10/31/2013   Procedure: PERCUTANEOUS CORONARY INTERVENTION-BALLOON ONLY;  Surgeon: Jettie Booze, MD;  Location: [facility] CATH LAB;  Service:  Cardiovascular;;  . ROTATOR CUFF REPAIR Right 07/2007   Dr. Percell Miller  . ROTATOR CUFF REPAIR Left 11/2004  . TOE AMPUTATION Right 08/2008   3rd toe, Dr. Sharol Given due to osteomyelitis  . VAGINAL HYSTERECTOMY  03/1975  . VEIN LIGATION Bilateral 03/1966  . VENA CAVA FILTER PLACEMENT  01/2010   green filter; "due to blood clots"  . WISDOM TOOTH EXTRACTION  06/2007    Social History   Social History  . Marital status: Divorced    Spouse name: N/A  . Number of children: 3  . Years of education: 15   Occupational History  . Retired      from [employer]    Social History  Main Topics  . Smoking status: Never Smoker  . Smokeless tobacco: Never Used     Comment: tried for a few months in college.   . Alcohol use No  . Drug use: No  . Sexual activity: No   Other Topics Concern  . Not on file   Social History Narrative   Daughter lives w/ her and another daughter lives in town   1 son in [place]   Son comes home every 4 weeks to visit   Caffeine use:  Tea 1/day w/ dinner   Coffee occass                Allergies as of 03/30/2017      Reactions   Cymbalta [duloxetine Hcl] Swelling   Swelling in legs   Gabapentin Swelling   Swelling in legs Swelling in legs   Silicone Hives, Itching, Dermatitis, Rash   Metformin And Related Other (See Comments)   dizzy, tired, chills, diarrhea, and nausea   Adhesive [tape] Rash   rash rash   Cefazolin Hives   Hives Hives   Ciprofloxacin Other (See Comments)   Other reaction(s): Other (See Comments) Per pt, caused body aches Per pt, caused body aches   Clorazepate Dipotassium Other (See Comments)   Unknown reaction   Darifenacin Hydrobromide Other (See Comments)   hypotension, near syncope   Dilaudid [hydromorphone Hcl] Other (See Comments)    confused, intense itching   Doxycycline Rash   Enablex [darifenacin Hydrobromide Er] Other (See Comments)   Hypotension, near syncope   Levofloxacin Other (See Comments)   Causes wrist pain    Lyrica [pregabalin] Swelling   Swelling in legs   Methadone Hcl Other (See Comments)   Reaction unknown   Morphine And Related Other (See Comments)   Confusion, constipation.   Talwin [pentazocine] Other (See Comments)   "climbing walls" anxiety      Medication List       Accurate as of 03/30/17 11:59 PM. Always use your most recent med list.          accu-chek soft touch lancets Check blood sugar no more than twice daily   Alpha-Lipoic Acid 600 MG Caps Take 600 mg by mouth 2 (two) times daily.   amitriptyline 10 MG tablet Commonly known as:  ELAVIL Start with 1 tablet at bedtime, increase to 2 or 3 tablets as needed for phantom pain   aspirin 81 MG tablet Take 81 mg by mouth daily.   atorvastatin 40 MG tablet Commonly known as:  LIPITOR Take 40 mg by mouth at bedtime.   carvedilol 25 MG tablet Commonly known as:  COREG Take 25 mg by mouth 2 (two) times daily with a meal.   Cinnamon 500 MG Tabs Take 500 mg by mouth 2 (two) times daily.   clopidogrel 75 MG tablet Commonly known as:  PLAVIX Take 75 mg by mouth at bedtime.   diclofenac sodium 1 % Gel Commonly known as:  VOLTAREN APPLY 2-4 GRAMS TO AFFECTED AREA 3 TIMES DAILY AS NEEDED   furosemide 20 MG tablet Commonly known as:  LASIX Take 20 mg by mouth daily.   glucose blood test strip Commonly known as:  ACCU-CHEK GUIDE Check blood sugar no more than twice daily   HYDROcodone-acetaminophen 5-325 MG tablet Commonly known as:  NORCO Take 1 tablet by mouth every 6 (six) hours as needed.   HYDROcodone-acetaminophen 5-325 MG tablet Commonly known as:  NORCO Take 1 tablet by mouth every 6 (six) hours  as needed.   HYDROcodone-acetaminophen 5-325 MG tablet Commonly known as:  NORCO Take 1 tablet by mouth every 6 (six) hours as needed.   hydrocortisone 25 MG suppository Commonly known as:  ANUSOL-HC Place 1 suppository (25 mg total) rectally every 12 (twelve) hours.   ibuprofen 200 MG tablet Commonly  known as:  ADVIL,MOTRIN Take 200-400 mg by mouth every 8 (eight) hours as needed (for pain.).   meclizine 25 MG tablet Commonly known as:  ANTIVERT Take 1 tablet (25 mg total) by mouth 2 (two) times daily as needed for dizziness.   nabumetone 750 MG tablet Commonly known as:  RELAFEN Take 750 mg by mouth 2 (two) times daily.   nitroGLYCERIN 0.4 MG SL tablet Commonly known as:  NITROSTAT Place 0.4 mg under the tongue every 5 (five) minutes as needed for chest pain (x 3 doses). Reported on 04/14/2016   omeprazole 20 MG capsule Commonly known as:  PRILOSEC TAKE 1 CAPSULE BY MOUTH 2 TIMES DAILY   ondansetron 4 MG disintegrating tablet Commonly known as:  ZOFRAN ODT Take 1 tablet (4 mg total) by mouth every 8 (eight) hours as needed for nausea or vomiting.   OVER THE COUNTER MEDICATION 1 tablet 3 (three) times daily. Berberorine Gluco Defense   OVER THE COUNTER MEDICATION Take 1 capsule by mouth 2 (two) times daily. Integrative Digestive Formula   PRESERVISION AREDS 2 Caps Take 1 capsule by mouth 2 (two) times daily.   REFRESH OP Place 1 drop into both eyes 3 (three) times daily as needed (dry eyes).   Vitamin B-12 500 MCG Subl Place 500 mcg under the tongue daily.   Vitamin D3 5000 units Caps Take 5,000 Units by mouth every evening. Reported on 11/24/2015          Objective:   Physical Exam BP 126/74 (BP Location: Right Arm, Patient Position: Sitting, Cuff Size: Normal)   Pulse 79   Temp 98.1 F (36.7 C) (Oral)   Resp 14   Ht 5' 5"  (1.651 m)   Wt 170 lb (77.1 kg) Comment: per Pt  SpO2 97%   BMI 28.29 kg/m  General:   Well developed, no apparent distress, sitting in a wheelchair HEENT:  Normocephalic . Face symmetric, atraumatic Lungs:  CTA B Normal respiratory effort, no intercostal retractions, no accessory muscle use. Heart: RRR,  no murmur.  Skin: Not pale. Not jaundice Neurologic:  alert & oriented X3.  Speech normal, gait not tested Psych--    Cognition and judgment appear intact.  Cooperative with normal attention span and concentration.  Behavior appropriate. No anxious or depressed appearing.     Assessment & Plan:    Assessment DM, Neuropathy, amputations due to Charcot feet. - metformin intolerant  (lethargic) HTN  Hyperlipidemia-- won't take statins  LUNG MASS , last CT 09-2016 CV: ---CAD sees Dr Meda Coffee and a cardiologist in [facility] --NSTEMI 09-2016. Outside hospital: Cath 2 vessel disease, CABG 2, LIMA to LAD and Diag   ---Chronic diastolic CHF GI: GERD, IBS, colon polyps, PUD, abdominal pain "post polypectomy syndrome" naueas meds prn (antivert-zofran, gets nausea when car traveling) Osteoporosis  MSK,pain mngmt : --Reflex sympathetic dystrophy --Neuropathy --DJD --Spinal stenosis  --H/o Osteomyelitis  Foot --s/p amputation:  2 L toes , R BKA  (01-2016, dx osteomyelitis) -- sees Dr Sharol Given prn Takes nortriptyline also for leg cramps. She had local injections with Dr. Maia Petties 2016 w/o apparent help. She took oxycodone after surgery and that did NOT seem to help better than hydrocodone.  Intolerant to Cymbalta, Neurontin ; Lyrica helped but caused edema. Recurrent UTIs -- bactrim q d Venous insufficiency  H/o  + PPD H/o hypothyroidism: TSHs stable H/o dyspnea  PLAN:  DM, HTN, high cholesterol: Continue same medications, all seem well controlled. No need for labs today Constipation: Was Rx but never tried Linzess, I review "up to date" and d/w pt the list of s/e, I don't see a major problem  but she remains skeptical. Will dc it and restart only if needed. Phantom pain: R LE, given list of allergies one option would be to try Elavil (in the past she used nortriptyline). Will start with a low dose and titrate up as needed. See instructions. Pain management: Refill hydrocodone. 3 prescription provided RTC 3 months   Today, I spent more than 25   min with the patient: >50% of the time counseling regards potential  side effects of linzess .pro-cons of Elavil.

## 2017-03-30 NOTE — Patient Instructions (Signed)
For pain: Start taking one amitriptyline at night. You can increase gradually to 2 or even 3 tablets as needed. Watch for drowsiness, take it only at night  Next visit in 3 months

## 2017-03-30 NOTE — Progress Notes (Signed)
Pre visit review using our clinic review tool, if applicable. No additional management support is needed unless otherwise documented below in the visit note. 

## 2017-04-01 DIAGNOSIS — K5909 Other constipation: Secondary | ICD-10-CM | POA: Insufficient documentation

## 2017-04-01 DIAGNOSIS — G546 Phantom limb syndrome with pain: Secondary | ICD-10-CM | POA: Insufficient documentation

## 2017-04-01 NOTE — Assessment & Plan Note (Signed)
DM, HTN, high cholesterol: Continue same medications, all seem well controlled. No need for labs today Constipation: Was Rx but never tried Linzess, I review "up to date" and d/w pt the list of s/e, I don't see a major problem  but she remains skeptical. Will dc it and restart only if needed. Phantom pain: R LE, given list of allergies one option would be to try Elavil (in the past she used nortriptyline). Will start with a low dose and titrate up as needed. See instructions. Pain management: Refill hydrocodone. 3 prescription provided RTC 3 months

## 2017-04-03 ENCOUNTER — Telehealth (INDEPENDENT_AMBULATORY_CARE_PROVIDER_SITE_OTHER): Payer: Self-pay | Admitting: *Deleted

## 2017-04-03 ENCOUNTER — Encounter (INDEPENDENT_AMBULATORY_CARE_PROVIDER_SITE_OTHER): Payer: Self-pay | Admitting: *Deleted

## 2017-04-03 DIAGNOSIS — R531 Weakness: Secondary | ICD-10-CM

## 2017-04-03 DIAGNOSIS — IMO0002 Reserved for concepts with insufficient information to code with codable children: Secondary | ICD-10-CM

## 2017-04-03 DIAGNOSIS — Z89511 Acquired absence of right leg below knee: Secondary | ICD-10-CM

## 2017-04-03 DIAGNOSIS — R2689 Other abnormalities of gait and mobility: Secondary | ICD-10-CM

## 2017-04-03 NOTE — Telephone Encounter (Signed)
Pt calling asking for a home health referral. Pt is in [place]- this is the best number to call pt back.

## 2017-04-03 NOTE — Telephone Encounter (Signed)
Patient requesting referral for physical therapy for gait training. Fax [phone]. Their phone is [phone]. I have called to advise patient referral was faxed.

## 2017-04-11 ENCOUNTER — Telehealth (INDEPENDENT_AMBULATORY_CARE_PROVIDER_SITE_OTHER): Payer: Self-pay

## 2017-04-11 ENCOUNTER — Other Ambulatory Visit (INDEPENDENT_AMBULATORY_CARE_PROVIDER_SITE_OTHER): Payer: Self-pay | Admitting: Orthopedic Surgery

## 2017-04-11 NOTE — Telephone Encounter (Signed)
Melody with [facility] stated that patient would need a office visit and a new referral since the one that they had received was from 11/2016.  CB# is [phone]

## 2017-04-11 NOTE — Telephone Encounter (Signed)
In order for referral to be covered by insurance patient would need to be seen within a 60 day period. Melody advised patient would need to see PCP and have order this for her since she is currently in [facility]. I called and advised patient of this and she expressed understanding.

## 2017-04-20 ENCOUNTER — Ambulatory Visit (INDEPENDENT_AMBULATORY_CARE_PROVIDER_SITE_OTHER): Payer: Medicare Other | Admitting: Orthopedic Surgery

## 2017-04-24 DIAGNOSIS — I25728 Atherosclerosis of autologous artery coronary artery bypass graft(s) with other forms of angina pectoris: Secondary | ICD-10-CM | POA: Diagnosis not present

## 2017-04-24 DIAGNOSIS — E782 Mixed hyperlipidemia: Secondary | ICD-10-CM | POA: Diagnosis not present

## 2017-04-24 DIAGNOSIS — I10 Essential (primary) hypertension: Secondary | ICD-10-CM | POA: Diagnosis not present

## 2017-04-24 DIAGNOSIS — E119 Type 2 diabetes mellitus without complications: Secondary | ICD-10-CM | POA: Diagnosis not present

## 2017-04-27 ENCOUNTER — Ambulatory Visit (INDEPENDENT_AMBULATORY_CARE_PROVIDER_SITE_OTHER): Payer: Medicare Other | Admitting: Orthopedic Surgery

## 2017-04-27 ENCOUNTER — Encounter (INDEPENDENT_AMBULATORY_CARE_PROVIDER_SITE_OTHER): Payer: Self-pay | Admitting: Orthopedic Surgery

## 2017-04-27 ENCOUNTER — Telehealth (INDEPENDENT_AMBULATORY_CARE_PROVIDER_SITE_OTHER): Payer: Self-pay | Admitting: Orthopedic Surgery

## 2017-04-27 VITALS — Ht 65.0 in | Wt 170.0 lb

## 2017-04-27 DIAGNOSIS — I2510 Atherosclerotic heart disease of native coronary artery without angina pectoris: Secondary | ICD-10-CM | POA: Diagnosis not present

## 2017-04-27 DIAGNOSIS — Z89511 Acquired absence of right leg below knee: Secondary | ICD-10-CM

## 2017-04-27 DIAGNOSIS — Z89422 Acquired absence of other left toe(s): Secondary | ICD-10-CM | POA: Diagnosis not present

## 2017-04-27 DIAGNOSIS — IMO0002 Reserved for concepts with insufficient information to code with codable children: Secondary | ICD-10-CM

## 2017-04-27 NOTE — Telephone Encounter (Signed)
[name] with [facility] called needing today's office note faxed to her. The fax# is [phone]. The ph# is [phone]

## 2017-04-27 NOTE — Telephone Encounter (Signed)
Faxed office note. 

## 2017-04-27 NOTE — Progress Notes (Signed)
Office Visit Note   Patient: [name]           Date of Birth: September 23, 1935           MRN: [id number] Visit Date: 04/27/2017              Requested by: Colon Branch, [address] [place], [place] [address] PCP: Colon Branch, MD  Chief Complaint  Patient presents with  . Right Leg - Follow-up    02/18/16 R BKA       HPI: Patient is a 82 year old woman status post right transtibial amputation last year status post left fourth toe amputation this year she has been seen in [place] she went to a cardiologist and states he had some vague ablations due to some blockages in her veins. Patient states she also has some arterial insufficiency and is wondering if she should have any arterial work performed. Patient has no claudication symptoms and no gangrenous ulcers.  Assessment & Plan: Visit Diagnoses:  1. History of right below knee amputation ([place])   2. Toe amputation status, left ([place])     Plan: Patient is given a prescription for physical therapy in [place] for gait training and strengthening balance for both lower extremities. I feel the patient's vascular status should improve with exercises and discussed the importance of exercise. Discussed if she develops any gangrenous ulcers or any claudication symptoms that evaluation for revascularization would be an option at that time. She will continue with a double upright brace of the left continue with her prosthesis on the right.  Follow-Up Instructions: Return if symptoms worsen or fail to improve.   Ortho Exam  Patient is alert, oriented, no adenopathy, well-dressed, normal affect, normal respiratory effort. Examination patient is able to a wheelchair. Examination is a well fitting prosthesis on the right. No ulcers. Left leg has venous stasis changes but no ulcers she has no sharp foot ulcers she does have some abrasions. She states she bumps her feet against the wall when she is trying to ambulate in  the bathroom.  Imaging: No results found.  Labs: Lab Results  Component Value Date   HGBA1C 5.6 12/14/2016   HGBA1C 5.7 09/18/2016   HGBA1C 5.8 (H) 04/14/2016   ESRSEDRATE 9 06/12/2014   ESRSEDRATE 12 05/13/2014   REPTSTATUS 06/10/2014 FINAL 06/08/2014   CULT NO GROWTH Performed at [facility] 06/08/2014   [place] 09/18/2016    Orders:  No orders of the defined types were placed in this encounter.  No orders of the defined types were placed in this encounter.    Procedures: No procedures performed  Clinical Data: No additional findings.  ROS:  All other systems negative, except as noted in the HPI. Review of Systems  Objective: Vital Signs: Ht 5' 5"  (1.651 m)   Wt 170 lb (77.1 kg)   BMI 28.29 kg/m   Specialty Comments:  No specialty comments available.  PMFS History: Patient Active Problem List   Diagnosis Date Noted  . Phantom pain ([place]) 04/01/2017  . Constipation, chronic 04/01/2017  . S/P unilateral BKA (below knee amputation), right ([place]) 12/14/2016  . Acquired absence of other left toe(s) ([place]) 12/14/2016  . Toe osteomyelitis, left ([place]) 11/17/2016  . Hypertensive heart disease   . Chronic diastolic CHF (congestive heart failure) ([place])   . Acute on chronic diastolic CHF (congestive heart failure), NYHA class 1 ([place]) 03/30/2016  . Acute on chronic diastolic  CHF (congestive heart failure), NYHA class 3 ([place]) 03/30/2016  . Chest pain 03/30/2016  . Coronary artery disease due to lipid rich plaque 03/30/2016  . Pain in the chest 03/30/2016  . Statin intolerance 03/30/2016  . Acute renal failure superimposed on stage 3 chronic kidney disease ([place]) 03/30/2016  . S/P below knee amputation ([place]) 02/18/2016  . Head trauma 11/26/2015  . Memory loss 11/26/2015  . PCP NOTES >>>>>>>>>>>>>>>>>>>> 10/18/2015  . DOE (dyspnea on exertion) 09/01/2015  . S/P partial colectomy 01/08/2015  . DDD (degenerative disc disease), lumbar  06/02/2014  . 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 Anemia 01/18/2014  . Nonspecific abnormal unspecified cardiovascular function study 10/31/2013  . Hemorrhoids 10/31/2013  . CAD (coronary artery disease)   . Carcinoma in situ in a polyp   . Depression 09/17/2013  . Charcot's joint of right foot S/P Reconstruction 08/21/2013  . Osteoarthritis 08/21/2013  . Foot osteomyelitis, right ([place]) 07/16/2013  . Extrinsic asthma, unspecified 07/16/2013  . Chronic pain associated with significant psychosocial dysfunction 05/06/2013  . Lumbar and sacral osteoarthritis 04/22/2013  . GERD (gastroesophageal reflux disease)   . Neuropathy   . Hyperthyroidism   . Lower extremity edema 03/08/2011  . Annual physical exam 03/08/2011  . ALLERGIC RHINITIS 04/15/2010  . ORTHOSTATIC DIZZINESS 04/13/2010  . HIATAL HERNIA 12/06/2009  . GASTRIC ULCER, HX OF 12/06/2009  . DYSPNEA 03/18/2009  . Urinary tract infection 07/07/2008  . DEGENERATIVE JOINT DISEASE 06/03/2008  . COLONIC POLYPS 01/27/2008  . DIVERTICULOSIS, COLON 01/27/2008  . IRRITABLE BOWEL SYNDROME, HX OF 01/27/2008  . PULMONARY NODULE 01/22/2008  . Hyperlipidemia 12/30/2007  . Osteoporosis 12/30/2007  . TB SKIN TEST, POSITIVE 08/09/2007  . DM type 2 with diabetic peripheral neuropathy ([place]) 05/09/2007  . Reflex sympathetic dystrophy-- PAIN MNGMT  05/09/2007  . Essential hypertension 04/15/2007  . GERD 04/15/2007   Past Medical History:  Diagnosis Date  . Abnormal echocardiogram    09/2013: mild LVH, mild focal basal hypertrophy of septum, EF 60-65%, normal WM, grade 1 diastolic dysfunction, MAC, mild LAE, ASA, PASP 34. Possible oscillating MV density - reviewed by MD and felt there was no significant abnormality other than MAC noted with mitral valve and did not require TEE.  . Adenomatous colon polyp   . Allergic rhinitis   . Arthritis    "back; ankles; hands; knees" (10/31/2013)  . Bilateral sensorineural  hearing loss   . Bladder infection    frequently  . CAD (coronary artery disease)    a. Nonobst in 2011. b. Abnormal nuc 09/2013 -> s/p cutting balloon to D2, mild LAD disease; c. 08/2015 MV: no ischemia, EF 71%.  . Cancer ([place])    cancerous polyps  . Carcinoma in situ in a polyp 1994   a. 1994 - malignant polyp removed during colonoscopy.  . Charcot's Foot    a.  01/2016 s/p RLE transtibial amputation 2/2 Charcot rocker-bottom deformity and insensate neuropathy ulceration.  . Chronic diastolic CHF (congestive heart failure) ([place])    a. 09/2013 Echo: EF 60-65%, mild LVH, Gr1 DD.  Marland Kitchen Diverticulosis   . DJD (degenerative joint disease)   . DVT (deep venous thrombosis) ([place]) 12/2007  . Dyspnea    a. Chronic, extensive w/u see OV note 09-2010. b. [place] 2011: ormal with RA 5 RV 31/2 PA 27/12 (19) PCW 10 CO normal. No evidence of shunting with sitting up in cath lab. c. CPX 2011: see report. d. Prior fluoro of diaphragm -  R diaphragm  elevated at rest but both moved with inspiration.   . Fatty liver   . GERD (gastroesophageal reflux disease)    a. Hx GERD/esophageal dysmotility followed by Dr. Olevia Perches.   Marland Kitchen Head injury due to trauma   . Headache    Optic migraine  . Heart attack ([place])    2017  . Hemorrhoids   . Hiatal hernia    a. s/p Nissen fundoplication 1610.  Marland Kitchen Hyperlipidemia    a. patient unwilling to use statins.  . Hypertensive heart disease   . IBS (irritable bowel syndrome)   . Macular degeneration 03/2009   Dr. Rosana Hoes  . Macular degeneration   . Myocardial infarction ([place])   . Neuropathy    a. Hands, feet, legs.  . Orthostatic hypotension   . Osteomyelitis ([place])    a. 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 01/2016 s/p RLE transtibial amputation 2/2 Charcot rocker-bottom deformity and insensate neuropathy ulceration.  . Osteoporosis   . PE (pulmonary embolism) 12/2007   a. PE/DVT after neck surgery 2009. b. coumadin d/c  10-2008.  Marland Kitchen Pneumonia 2015ish  . PONV (postoperative nausea and vomiting) 2009   neck surgery  . PPD positive   . Pulmonary nodule    incidental per CT:  Pet scan 4-9: likely benign, CT 08-2009 no change, no further CTs (Dr. Gwenette Greet)  . Recurrent UTI   . RSD (reflex sympathetic dystrophy)    a. Chronic pain.  Marland Kitchen Spinal stenosis   . Type II diabetes mellitus (HCC)    no on medication  . Venous insufficiency    a. Contributing to LEE.    Family History  Problem Relation Age of Onset  . Heart disease Mother        mitral valve replaced  . Arthritis Mother   . Diabetic kidney disease Daughter   . Diabetes Father   . Stroke Father   . Migraines Sister   . Hypertension Unknown   . Breast cancer Unknown   . Headache Sister   . Colon cancer Neg Hx   . Esophageal cancer Neg Hx   . Stomach cancer Neg Hx   . Rectal cancer Neg Hx     Past Surgical History:  Procedure Laterality Date  . AMPUTATION  03/06/2012   Procedure: AMPUTATION FOOT;  Surgeon: Newt Minion, MD;  Location: [facility];  Service: Orthopedics;  Laterality: Left;  FIFTH RAY AMPUTATION   . AMPUTATION Right 02/18/2016   Procedure: AMPUTATION BELOW KNEE;  Surgeon: Newt Minion, MD;  Location: [facility];  Service: Orthopedics;  Laterality: Right;  . AMPUTATION Left 11/22/2016   Procedure: Amputation 4th Toe Left Foot at Metatarsophalangeal Joint;  Surgeon: Newt Minion, MD;  Location: [facility];  Service: Orthopedics;  Laterality: Left;  . ANKLE FUSION  09/27/2012   Procedure: ANKLE FUSION;  Surgeon: Newt Minion, MD;  Location: [facility];  Service: Orthopedics;  Laterality: Left;  Left Tibiocalcaneal Fusion  . ANKLE FUSION Right 05/09/2013   Procedure: ANKLE FUSION;  Surgeon: Newt Minion, MD;  Location: [facility];  Service: Orthopedics;  Laterality: Right;  Excision Osteomyelitis Base 1st MT Right Foot, Fusion Medial Column  . ANKLE SURGERY Left apr & june 1991  . ANTERIOR CERVICAL DECOMP/DISCECTOMY FUSION  12/18/07   For OA,  Dr. Lorin Mercy:  fu  by a PE  . CARDIAC CATHETERIZATION  05/2010    at [facility]  . CARPAL TUNNEL RELEASE Left 09/2011  .  CATARACT EXTRACTION W/ INTRAOCULAR LENS  IMPLANT, BILATERAL  2004   feb 2004 left, aug 2004 right  . CHOLECYSTECTOMY  03/2002  . COLONOSCOPY     numerous times  . CORONARY ANGIOPLASTY  10/31/2013  . CORONARY ARTERY BYPASS GRAFT  09/27/2016  . CORONARY ARTERY BYPASS GRAFT  09/2016   in [place].  . CYST REMOVAL HAND  06/2003  . FOOT BONE EXCISION Right 06/2009  . LAPAROSCOPIC RIGHT HEMI COLECTOMY N/A 01/08/2015   Procedure: LAPAROSCOPIC ASSISTED RIGHT HEMI COLECTOMY;  Surgeon: Johnathan Hausen, MD;  [facility];  Service: General;  Laterality: N/A;  . LEFT HEART CATHETERIZATION WITH CORONARY ANGIOGRAM N/A 10/31/2013   Procedure: LEFT HEART CATHETERIZATION WITH CORONARY ANGIOGRAM;  Surgeon: Jettie Booze, MD;  Location[facility];  Service: Cardiovascular;  Laterality: N/A;  . NASAL SEPTUM SURGERY  09/1963  . NISSEN FUNDOPLICATION  05/27/6313  . PERCUTANEOUS CORONARY INTERVENTION-BALLOON ONLY  10/31/2013   Procedure: PERCUTANEOUS CORONARY INTERVENTION-BALLOON ONLY;  Surgeon: Jettie Booze, MD;  Location: [facility] CATH LAB;  Service: Cardiovascular;;  . ROTATOR CUFF REPAIR Right 07/2007   Dr. Percell Miller  . ROTATOR CUFF REPAIR Left 11/2004  . TOE AMPUTATION Right 08/2008   3rd toe, Dr. Sharol Given due to osteomyelitis  . VAGINAL HYSTERECTOMY  03/1975  . VEIN LIGATION Bilateral 03/1966  . VENA CAVA FILTER PLACEMENT  01/2010   green filter; "due to blood clots"  . WISDOM TOOTH EXTRACTION  06/2007   Social History   Occupational History  . Retired      from [employer]    Social History Main Topics  . Smoking status: Never Smoker  . Smokeless tobacco: Never Used     Comment: tried for a few months in college.   . Alcohol use No  . Drug use: No  . Sexual activity: No

## 2017-05-01 ENCOUNTER — Telehealth (INDEPENDENT_AMBULATORY_CARE_PROVIDER_SITE_OTHER): Payer: Self-pay | Admitting: Orthopedic Surgery

## 2017-05-01 DIAGNOSIS — Z89422 Acquired absence of other left toe(s): Secondary | ICD-10-CM | POA: Diagnosis not present

## 2017-05-01 DIAGNOSIS — M6281 Muscle weakness (generalized): Secondary | ICD-10-CM | POA: Diagnosis not present

## 2017-05-01 DIAGNOSIS — Z89511 Acquired absence of right leg below knee: Secondary | ICD-10-CM | POA: Diagnosis not present

## 2017-05-01 DIAGNOSIS — R531 Weakness: Secondary | ICD-10-CM | POA: Diagnosis not present

## 2017-05-01 DIAGNOSIS — I10 Essential (primary) hypertension: Secondary | ICD-10-CM | POA: Diagnosis not present

## 2017-05-01 DIAGNOSIS — R2689 Other abnormalities of gait and mobility: Secondary | ICD-10-CM | POA: Diagnosis not present

## 2017-05-01 DIAGNOSIS — R262 Difficulty in walking, not elsewhere classified: Secondary | ICD-10-CM | POA: Diagnosis not present

## 2017-05-01 NOTE — Telephone Encounter (Signed)
Levada Dy (PT) with [facility] called advised she saw patient today for eval and patient has 1 opening on her lt great toe and 1 opening on her 2nd toe.  Forwarded call to triage.   [facility] # for Levada Dy is [phone]

## 2017-05-01 NOTE — Telephone Encounter (Signed)
Noted that triage was forwarded this call.

## 2017-05-03 DIAGNOSIS — M6281 Muscle weakness (generalized): Secondary | ICD-10-CM | POA: Diagnosis not present

## 2017-05-03 DIAGNOSIS — R262 Difficulty in walking, not elsewhere classified: Secondary | ICD-10-CM | POA: Diagnosis not present

## 2017-05-03 DIAGNOSIS — Z89511 Acquired absence of right leg below knee: Secondary | ICD-10-CM | POA: Diagnosis not present

## 2017-05-03 DIAGNOSIS — Z89422 Acquired absence of other left toe(s): Secondary | ICD-10-CM | POA: Diagnosis not present

## 2017-05-03 DIAGNOSIS — R2689 Other abnormalities of gait and mobility: Secondary | ICD-10-CM | POA: Diagnosis not present

## 2017-05-03 DIAGNOSIS — R531 Weakness: Secondary | ICD-10-CM | POA: Diagnosis not present

## 2017-05-08 DIAGNOSIS — R531 Weakness: Secondary | ICD-10-CM | POA: Diagnosis not present

## 2017-05-08 DIAGNOSIS — R2689 Other abnormalities of gait and mobility: Secondary | ICD-10-CM | POA: Diagnosis not present

## 2017-05-08 DIAGNOSIS — M6281 Muscle weakness (generalized): Secondary | ICD-10-CM | POA: Diagnosis not present

## 2017-05-08 DIAGNOSIS — Z89422 Acquired absence of other left toe(s): Secondary | ICD-10-CM | POA: Diagnosis not present

## 2017-05-08 DIAGNOSIS — R262 Difficulty in walking, not elsewhere classified: Secondary | ICD-10-CM | POA: Diagnosis not present

## 2017-05-08 DIAGNOSIS — Z89511 Acquired absence of right leg below knee: Secondary | ICD-10-CM | POA: Diagnosis not present

## 2017-05-10 DIAGNOSIS — R531 Weakness: Secondary | ICD-10-CM | POA: Diagnosis not present

## 2017-05-10 DIAGNOSIS — R2689 Other abnormalities of gait and mobility: Secondary | ICD-10-CM | POA: Diagnosis not present

## 2017-05-10 DIAGNOSIS — Z89422 Acquired absence of other left toe(s): Secondary | ICD-10-CM | POA: Diagnosis not present

## 2017-05-10 DIAGNOSIS — M6281 Muscle weakness (generalized): Secondary | ICD-10-CM | POA: Diagnosis not present

## 2017-05-10 DIAGNOSIS — Z89511 Acquired absence of right leg below knee: Secondary | ICD-10-CM | POA: Diagnosis not present

## 2017-05-10 DIAGNOSIS — R262 Difficulty in walking, not elsewhere classified: Secondary | ICD-10-CM | POA: Diagnosis not present

## 2017-05-14 DIAGNOSIS — M6281 Muscle weakness (generalized): Secondary | ICD-10-CM | POA: Diagnosis not present

## 2017-05-14 DIAGNOSIS — Z89511 Acquired absence of right leg below knee: Secondary | ICD-10-CM | POA: Diagnosis not present

## 2017-05-14 DIAGNOSIS — R262 Difficulty in walking, not elsewhere classified: Secondary | ICD-10-CM | POA: Diagnosis not present

## 2017-05-14 DIAGNOSIS — Z89422 Acquired absence of other left toe(s): Secondary | ICD-10-CM | POA: Diagnosis not present

## 2017-05-14 DIAGNOSIS — R531 Weakness: Secondary | ICD-10-CM | POA: Diagnosis not present

## 2017-05-14 DIAGNOSIS — R2689 Other abnormalities of gait and mobility: Secondary | ICD-10-CM | POA: Diagnosis not present

## 2017-05-15 DIAGNOSIS — R2689 Other abnormalities of gait and mobility: Secondary | ICD-10-CM | POA: Diagnosis not present

## 2017-05-15 DIAGNOSIS — M6281 Muscle weakness (generalized): Secondary | ICD-10-CM | POA: Diagnosis not present

## 2017-05-15 DIAGNOSIS — Z89511 Acquired absence of right leg below knee: Secondary | ICD-10-CM | POA: Diagnosis not present

## 2017-05-15 DIAGNOSIS — R531 Weakness: Secondary | ICD-10-CM | POA: Diagnosis not present

## 2017-05-15 DIAGNOSIS — Z89422 Acquired absence of other left toe(s): Secondary | ICD-10-CM | POA: Diagnosis not present

## 2017-05-15 DIAGNOSIS — R262 Difficulty in walking, not elsewhere classified: Secondary | ICD-10-CM | POA: Diagnosis not present

## 2017-05-17 DIAGNOSIS — R262 Difficulty in walking, not elsewhere classified: Secondary | ICD-10-CM | POA: Diagnosis not present

## 2017-05-17 DIAGNOSIS — R2689 Other abnormalities of gait and mobility: Secondary | ICD-10-CM | POA: Diagnosis not present

## 2017-05-17 DIAGNOSIS — Z89422 Acquired absence of other left toe(s): Secondary | ICD-10-CM | POA: Diagnosis not present

## 2017-05-17 DIAGNOSIS — M6281 Muscle weakness (generalized): Secondary | ICD-10-CM | POA: Diagnosis not present

## 2017-05-17 DIAGNOSIS — R531 Weakness: Secondary | ICD-10-CM | POA: Diagnosis not present

## 2017-05-17 DIAGNOSIS — Z89511 Acquired absence of right leg below knee: Secondary | ICD-10-CM | POA: Diagnosis not present

## 2017-05-22 DIAGNOSIS — N3020 Other chronic cystitis without hematuria: Secondary | ICD-10-CM | POA: Diagnosis not present

## 2017-05-22 DIAGNOSIS — N281 Cyst of kidney, acquired: Secondary | ICD-10-CM | POA: Diagnosis not present

## 2017-05-23 ENCOUNTER — Ambulatory Visit (INDEPENDENT_AMBULATORY_CARE_PROVIDER_SITE_OTHER): Payer: Medicare Other | Admitting: Orthopedic Surgery

## 2017-05-23 ENCOUNTER — Encounter (INDEPENDENT_AMBULATORY_CARE_PROVIDER_SITE_OTHER): Payer: Self-pay | Admitting: Orthopedic Surgery

## 2017-05-23 VITALS — Ht 65.0 in | Wt 175.0 lb

## 2017-05-23 DIAGNOSIS — I2510 Atherosclerotic heart disease of native coronary artery without angina pectoris: Secondary | ICD-10-CM | POA: Diagnosis not present

## 2017-05-23 DIAGNOSIS — Z89511 Acquired absence of right leg below knee: Secondary | ICD-10-CM

## 2017-05-23 DIAGNOSIS — I70245 Atherosclerosis of native arteries of left leg with ulceration of other part of foot: Secondary | ICD-10-CM | POA: Diagnosis not present

## 2017-05-23 MED ORDER — NITROGLYCERIN 0.2 MG/HR TD PT24: 0.2000 mg | MEDICATED_PATCH | Freq: Every day | TRANSDERMAL | 12 refills | Status: DC

## 2017-05-23 NOTE — Progress Notes (Signed)
Office Visit Note   Patient: [name]           Date of Birth: 1935-05-17           MRN: [id number] Visit Date: 05/23/2017              Requested by: Colon Branch, [address] [place], [place] [address] PCP: Colon Branch, MD  No chief complaint on file.     HPI: 29 patient is a 82 year old woman with peripheral vascular disease and multiple medical problems she status post transtibial imitation the right status post partial ray amputations on the left. Patient developed an abrasion ulcers over the dorsum of the great toe and second toe and despite cutting out the shoe wear these locations she still has persistent ischemic ulcers.   Assessment & Plan: Visit Diagnoses:  1. History of right below knee amputation ([place])   2. Atherosclerosis of native artery of left lower extremity with ulceration of other part of foot ([place])     Plan: Recommend patient follow-up for her vascular workup that was scheduled previously to see if there are any revascularization options for the left lower extremity. She is given a prescription for nitroglycerin to use a 0.2 mg patch daily change the location daily over the dorsum of the left foot in line with the second toe. She will use Silvadene dressing changes on the ulcers on both toes change the dressing daily.  Follow-Up Instructions: Return in about 4 weeks (around 06/20/2017).   Ortho Exam  Patient is alert, oriented, no adenopathy, well-dressed, normal affect, normal respiratory effort. Examination patient is ambulating in a wheelchair. She is stable fitting prosthesis on the right left foot she does not have palpable pulses there is no black ischemic changes but she does have ischemic ulcer dorsally over the great toe and second toe. There is no purulent drainage no cellulitis no motor no signs of any gross infection.  Imaging: No results found.  Labs: Lab Results  Component Value Date   HGBA1C 5.6 12/14/2016   HGBA1C 5.7  09/18/2016   HGBA1C 5.8 (H) 04/14/2016   ESRSEDRATE 9 06/12/2014   ESRSEDRATE 12 05/13/2014   REPTSTATUS 06/10/2014 FINAL 06/08/2014   CULT NO GROWTH Performed at [facility] 06/08/2014   [place] 09/18/2016    Orders:  No orders of the defined types were placed in this encounter.  No orders of the defined types were placed in this encounter.    Procedures: No procedures performed  Clinical Data: No additional findings.  ROS:  All other systems negative, except as noted in the HPI. Review of Systems  Objective: Vital Signs: Ht _0  (1.651 m)   Wt 175 lb (79.4 kg)   BMI 29.12 kg/m   Specialty Comments:  No specialty comments available.  PMFS History: Patient Active Problem List   Diagnosis Date Noted  . Phantom pain ([place]) 04/01/2017  . Constipation, chronic 04/01/2017  . S/P unilateral BKA (below knee amputation), right ([place]) 12/14/2016  . Acquired absence of other left toe(s) ([place]) 12/14/2016  . Toe osteomyelitis, left ([place]) 11/17/2016  . Hypertensive heart disease   . Chronic diastolic CHF (congestive heart failure) ([place])   . Acute on chronic diastolic CHF (congestive heart failure), NYHA class 1 ([place]) 03/30/2016  . Acute on chronic diastolic CHF (congestive heart failure), NYHA class 3 ([place]) 03/30/2016  . Chest pain 03/30/2016  . Coronary artery disease due to lipid rich  plaque 03/30/2016  . Pain in the chest 03/30/2016  . Statin intolerance 03/30/2016  . Acute renal failure superimposed on stage 3 chronic kidney disease ([place]) 03/30/2016  . S/P below knee amputation ([place]) 02/18/2016  . Head trauma 11/26/2015  . Memory loss 11/26/2015  . PCP NOTES >>>>>>>>>>>>>>>>>>>> 10/18/2015  . DOE (dyspnea on exertion) 09/01/2015  . S/P partial colectomy 01/08/2015  . DDD (degenerative disc disease), lumbar 06/02/2014  . 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 Anemia 01/18/2014  . Nonspecific abnormal  unspecified cardiovascular function study 10/31/2013  . Hemorrhoids 10/31/2013  . CAD (coronary artery disease)   . Carcinoma in situ in a polyp   . Depression 09/17/2013  . Charcot's joint of right foot S/P Reconstruction 08/21/2013  . Osteoarthritis 08/21/2013  . Foot osteomyelitis, right ([place]) 07/16/2013  . Extrinsic asthma, unspecified 07/16/2013  . Chronic pain associated with significant psychosocial dysfunction 05/06/2013  . Lumbar and sacral osteoarthritis 04/22/2013  . GERD (gastroesophageal reflux disease)   . Neuropathy   . Hyperthyroidism   . Lower extremity edema 03/08/2011  . Annual physical exam 03/08/2011  . ALLERGIC RHINITIS 04/15/2010  . ORTHOSTATIC DIZZINESS 04/13/2010  . HIATAL HERNIA 12/06/2009  . GASTRIC ULCER, HX OF 12/06/2009  . DYSPNEA 03/18/2009  . Urinary tract infection 07/07/2008  . DEGENERATIVE JOINT DISEASE 06/03/2008  . COLONIC POLYPS 01/27/2008  . DIVERTICULOSIS, COLON 01/27/2008  . IRRITABLE BOWEL SYNDROME, HX OF 01/27/2008  . PULMONARY NODULE 01/22/2008  . Hyperlipidemia 12/30/2007  . Osteoporosis 12/30/2007  . TB SKIN TEST, POSITIVE 08/09/2007  . DM type 2 with diabetic peripheral neuropathy ([place]) 05/09/2007  . Reflex sympathetic dystrophy-- PAIN MNGMT  05/09/2007  . Essential hypertension 04/15/2007  . GERD 04/15/2007   Past Medical History:  Diagnosis Date  . Abnormal echocardiogram    09/2013: mild LVH, mild focal basal hypertrophy of septum, EF 60-65%, normal WM, grade 1 diastolic dysfunction, MAC, mild LAE, ASA, PASP 34. Possible oscillating MV density - reviewed by MD and felt there was no significant abnormality other than MAC noted with mitral valve and did not require TEE.  . Adenomatous colon polyp   . Allergic rhinitis   . Arthritis    "back; ankles; hands; knees" (10/31/2013)  . Bilateral sensorineural hearing loss   . Bladder infection    frequently  . CAD (coronary artery disease)    a. Nonobst in 2011. b. Abnormal nuc  09/2013 -> s/p cutting balloon to D2, mild LAD disease; c. 08/2015 MV: no ischemia, EF 71%.  . Cancer ([place])    cancerous polyps  . Carcinoma in situ in a polyp 1994   a. 1994 - malignant polyp removed during colonoscopy.  . Charcot's Foot    a.  01/2016 s/p RLE transtibial amputation 2/2 Charcot rocker-bottom deformity and insensate neuropathy ulceration.  . Chronic diastolic CHF (congestive heart failure) ([place])    a. 09/2013 Echo: EF 60-65%, mild LVH, Gr1 DD.  Marland Kitchen Diverticulosis   . DJD (degenerative joint disease)   . DVT (deep venous thrombosis) ([place]) 12/2007  . Dyspnea    a. Chronic, extensive w/u see OV note 09-2010. b. [place] 2011: ormal with RA 5 RV 31/2 PA 27/12 (19) PCW 10 CO normal. No evidence of shunting with sitting up in cath lab. c. CPX 2011: see report. d. Prior fluoro of diaphragm -  R diaphragm elevated at rest but both moved with inspiration.   . Fatty liver   . GERD (gastroesophageal reflux disease)  a. Hx GERD/esophageal dysmotility followed by Dr. Olevia Perches.   Marland Kitchen Head injury due to trauma   . Headache    Optic migraine  . Heart attack ([place])    2017  . Hemorrhoids   . Hiatal hernia    a. s/p Nissen fundoplication 3664.  Marland Kitchen Hyperlipidemia    a. patient unwilling to use statins.  . Hypertensive heart disease   . IBS (irritable bowel syndrome)   . Macular degeneration 03/2009   Dr. Rosana Hoes  . Macular degeneration   . Myocardial infarction ([place])   . Neuropathy    a. Hands, feet, legs.  . Orthostatic hypotension   . Osteomyelitis ([place])    a. 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 01/2016 s/p RLE transtibial amputation 2/2 Charcot rocker-bottom deformity and insensate neuropathy ulceration.  . Osteoporosis   . PE (pulmonary embolism) 12/2007   a. PE/DVT after neck surgery 2009. b. coumadin d/c 10-2008.  Marland Kitchen Pneumonia 2015ish  . PONV (postoperative nausea and vomiting) 2009   neck surgery  . PPD positive   .  Pulmonary nodule    incidental per CT:  Pet scan 4-9: likely benign, CT 08-2009 no change, no further CTs (Dr. Gwenette Greet)  . Recurrent UTI   . RSD (reflex sympathetic dystrophy)    a. Chronic pain.  Marland Kitchen Spinal stenosis   . Type II diabetes mellitus (HCC)    no on medication  . Venous insufficiency    a. Contributing to LEE.    Family History  Problem Relation Age of Onset  . Heart disease Mother        mitral valve replaced  . Arthritis Mother   . Diabetic kidney disease Daughter   . Diabetes Father   . Stroke Father   . Migraines Sister   . Hypertension Unknown   . Breast cancer Unknown   . Headache Sister   . Colon cancer Neg Hx   . Esophageal cancer Neg Hx   . Stomach cancer Neg Hx   . Rectal cancer Neg Hx     Past Surgical History:  Procedure Laterality Date  . AMPUTATION  03/06/2012   Procedure: AMPUTATION FOOT;  Surgeon: Newt Minion, MD;  Location: [facility];  Service: Orthopedics;  Laterality: Left;  FIFTH RAY AMPUTATION   . AMPUTATION Right 02/18/2016   Procedure: AMPUTATION BELOW KNEE;  Surgeon: Newt Minion, MD;  Location: [facility];  Service: Orthopedics;  Laterality: Right;  . AMPUTATION Left 11/22/2016   Procedure: Amputation 4th Toe Left Foot at Metatarsophalangeal Joint;  Surgeon: Newt Minion, MD;  Location: [facility];  Service: Orthopedics;  Laterality: Left;  . ANKLE FUSION  09/27/2012   Procedure: ANKLE FUSION;  Surgeon: Newt Minion, MD;  Location: [facility];  Service: Orthopedics;  Laterality: Left;  Left Tibiocalcaneal Fusion  . ANKLE FUSION Right 05/09/2013   Procedure: ANKLE FUSION;  Surgeon: Newt Minion, MD;  Location: [facility];  Service: Orthopedics;  Laterality: Right;  Excision Osteomyelitis Base 1st MT Right Foot, Fusion Medial Column  . ANKLE SURGERY Left apr & june 1991  . ANTERIOR CERVICAL DECOMP/DISCECTOMY FUSION  12/18/07   For OA,  Dr. Lorin Mercy:  fu by a PE  . CARDIAC CATHETERIZATION  05/2010    at [facility]  . CARPAL TUNNEL RELEASE Left 09/2011  . CATARACT  EXTRACTION W/ INTRAOCULAR LENS  IMPLANT, BILATERAL  2004   feb 2004 left, aug 2004 right  . CHOLECYSTECTOMY  03/2002  . COLONOSCOPY     numerous times  . CORONARY ANGIOPLASTY  10/31/2013  . CORONARY ARTERY BYPASS GRAFT  09/27/2016  . CORONARY ARTERY BYPASS GRAFT  09/2016   in [place].  . CYST REMOVAL HAND  06/2003  . FOOT BONE EXCISION Right 06/2009  . LAPAROSCOPIC RIGHT HEMI COLECTOMY N/A 01/08/2015   Procedure: LAPAROSCOPIC ASSISTED RIGHT HEMI COLECTOMY;  Surgeon: Johnathan Hausen, MD;  [facility];  Service: General;  Laterality: N/A;  . LEFT HEART CATHETERIZATION WITH CORONARY ANGIOGRAM N/A 10/31/2013   Procedure: LEFT HEART CATHETERIZATION WITH CORONARY ANGIOGRAM;  Surgeon: Jettie Booze, MD;  Location: [facility] CATH LAB;  Service: Cardiovascular;  Laterality: N/A;  . NASAL SEPTUM SURGERY  09/1963  . NISSEN FUNDOPLICATION  0/12/4959  . PERCUTANEOUS CORONARY INTERVENTION-BALLOON ONLY  10/31/2013   Procedure: PERCUTANEOUS CORONARY INTERVENTION-BALLOON ONLY;  Surgeon: Jettie Booze, MD;  Location: [facility] CATH LAB;  Service: Cardiovascular;;  . ROTATOR CUFF REPAIR Right 07/2007   Dr. Percell Miller  . ROTATOR CUFF REPAIR Left 11/2004  . TOE AMPUTATION Right 08/2008   3rd toe, Dr. Sharol Given due to osteomyelitis  . VAGINAL HYSTERECTOMY  03/1975  . VEIN LIGATION Bilateral 03/1966  . VENA CAVA FILTER PLACEMENT  01/2010   green filter; "due to blood clots"  . WISDOM TOOTH EXTRACTION  06/2007   Social History   Occupational History  . Retired      from [employer]    Social History Main Topics  . Smoking status: Never Smoker  . Smokeless tobacco: Never Used     Comment: tried for a few months in college.   . Alcohol use No  . Drug use: No  . Sexual activity: No

## 2017-05-25 ENCOUNTER — Telehealth (INDEPENDENT_AMBULATORY_CARE_PROVIDER_SITE_OTHER): Payer: Self-pay | Admitting: Orthopedic Surgery

## 2017-05-25 NOTE — Telephone Encounter (Signed)
I called and spoke with Levada Dy to give verbal ok for physical therapy.

## 2017-05-25 NOTE — Telephone Encounter (Signed)
[name] with [facility] called needing verbal  Orders to continue (PT) 2 wk 4. The number to contact [name] is [phone]

## 2017-05-29 DIAGNOSIS — R262 Difficulty in walking, not elsewhere classified: Secondary | ICD-10-CM | POA: Diagnosis not present

## 2017-05-29 DIAGNOSIS — Z89511 Acquired absence of right leg below knee: Secondary | ICD-10-CM | POA: Diagnosis not present

## 2017-05-29 DIAGNOSIS — Z89422 Acquired absence of other left toe(s): Secondary | ICD-10-CM | POA: Diagnosis not present

## 2017-05-29 DIAGNOSIS — M6281 Muscle weakness (generalized): Secondary | ICD-10-CM | POA: Diagnosis not present

## 2017-05-29 DIAGNOSIS — R2689 Other abnormalities of gait and mobility: Secondary | ICD-10-CM | POA: Diagnosis not present

## 2017-05-29 DIAGNOSIS — R531 Weakness: Secondary | ICD-10-CM | POA: Diagnosis not present

## 2017-05-31 DIAGNOSIS — R531 Weakness: Secondary | ICD-10-CM | POA: Diagnosis not present

## 2017-05-31 DIAGNOSIS — M6281 Muscle weakness (generalized): Secondary | ICD-10-CM | POA: Diagnosis not present

## 2017-05-31 DIAGNOSIS — R2689 Other abnormalities of gait and mobility: Secondary | ICD-10-CM | POA: Diagnosis not present

## 2017-05-31 DIAGNOSIS — Z89511 Acquired absence of right leg below knee: Secondary | ICD-10-CM | POA: Diagnosis not present

## 2017-05-31 DIAGNOSIS — R262 Difficulty in walking, not elsewhere classified: Secondary | ICD-10-CM | POA: Diagnosis not present

## 2017-05-31 DIAGNOSIS — Z89422 Acquired absence of other left toe(s): Secondary | ICD-10-CM | POA: Diagnosis not present

## 2017-06-01 DIAGNOSIS — I25728 Atherosclerosis of autologous artery coronary artery bypass graft(s) with other forms of angina pectoris: Secondary | ICD-10-CM | POA: Diagnosis not present

## 2017-06-01 DIAGNOSIS — E119 Type 2 diabetes mellitus without complications: Secondary | ICD-10-CM | POA: Diagnosis not present

## 2017-06-01 DIAGNOSIS — E782 Mixed hyperlipidemia: Secondary | ICD-10-CM | POA: Diagnosis not present

## 2017-06-04 DIAGNOSIS — R262 Difficulty in walking, not elsewhere classified: Secondary | ICD-10-CM | POA: Diagnosis not present

## 2017-06-04 DIAGNOSIS — M6281 Muscle weakness (generalized): Secondary | ICD-10-CM | POA: Diagnosis not present

## 2017-06-04 DIAGNOSIS — R531 Weakness: Secondary | ICD-10-CM | POA: Diagnosis not present

## 2017-06-04 DIAGNOSIS — R2689 Other abnormalities of gait and mobility: Secondary | ICD-10-CM | POA: Diagnosis not present

## 2017-06-04 DIAGNOSIS — Z89511 Acquired absence of right leg below knee: Secondary | ICD-10-CM | POA: Diagnosis not present

## 2017-06-04 DIAGNOSIS — Z89422 Acquired absence of other left toe(s): Secondary | ICD-10-CM | POA: Diagnosis not present

## 2017-06-06 DIAGNOSIS — R531 Weakness: Secondary | ICD-10-CM | POA: Diagnosis not present

## 2017-06-06 DIAGNOSIS — R2689 Other abnormalities of gait and mobility: Secondary | ICD-10-CM | POA: Diagnosis not present

## 2017-06-06 DIAGNOSIS — Z89511 Acquired absence of right leg below knee: Secondary | ICD-10-CM | POA: Diagnosis not present

## 2017-06-06 DIAGNOSIS — Z89422 Acquired absence of other left toe(s): Secondary | ICD-10-CM | POA: Diagnosis not present

## 2017-06-06 DIAGNOSIS — R262 Difficulty in walking, not elsewhere classified: Secondary | ICD-10-CM | POA: Diagnosis not present

## 2017-06-06 DIAGNOSIS — M6281 Muscle weakness (generalized): Secondary | ICD-10-CM | POA: Diagnosis not present

## 2017-06-07 DIAGNOSIS — Z89422 Acquired absence of other left toe(s): Secondary | ICD-10-CM | POA: Diagnosis not present

## 2017-06-07 DIAGNOSIS — R2689 Other abnormalities of gait and mobility: Secondary | ICD-10-CM | POA: Diagnosis not present

## 2017-06-07 DIAGNOSIS — Z89511 Acquired absence of right leg below knee: Secondary | ICD-10-CM | POA: Diagnosis not present

## 2017-06-07 DIAGNOSIS — R531 Weakness: Secondary | ICD-10-CM | POA: Diagnosis not present

## 2017-06-07 DIAGNOSIS — M6281 Muscle weakness (generalized): Secondary | ICD-10-CM | POA: Diagnosis not present

## 2017-06-07 DIAGNOSIS — R262 Difficulty in walking, not elsewhere classified: Secondary | ICD-10-CM | POA: Diagnosis not present

## 2017-06-11 DIAGNOSIS — R531 Weakness: Secondary | ICD-10-CM | POA: Diagnosis not present

## 2017-06-11 DIAGNOSIS — Z89511 Acquired absence of right leg below knee: Secondary | ICD-10-CM | POA: Diagnosis not present

## 2017-06-11 DIAGNOSIS — R262 Difficulty in walking, not elsewhere classified: Secondary | ICD-10-CM | POA: Diagnosis not present

## 2017-06-11 DIAGNOSIS — M6281 Muscle weakness (generalized): Secondary | ICD-10-CM | POA: Diagnosis not present

## 2017-06-11 DIAGNOSIS — Z89422 Acquired absence of other left toe(s): Secondary | ICD-10-CM | POA: Diagnosis not present

## 2017-06-11 DIAGNOSIS — R2689 Other abnormalities of gait and mobility: Secondary | ICD-10-CM | POA: Diagnosis not present

## 2017-06-14 DIAGNOSIS — R262 Difficulty in walking, not elsewhere classified: Secondary | ICD-10-CM | POA: Diagnosis not present

## 2017-06-14 DIAGNOSIS — Z89422 Acquired absence of other left toe(s): Secondary | ICD-10-CM | POA: Diagnosis not present

## 2017-06-14 DIAGNOSIS — R2689 Other abnormalities of gait and mobility: Secondary | ICD-10-CM | POA: Diagnosis not present

## 2017-06-14 DIAGNOSIS — R531 Weakness: Secondary | ICD-10-CM | POA: Diagnosis not present

## 2017-06-14 DIAGNOSIS — M6281 Muscle weakness (generalized): Secondary | ICD-10-CM | POA: Diagnosis not present

## 2017-06-14 DIAGNOSIS — Z89511 Acquired absence of right leg below knee: Secondary | ICD-10-CM | POA: Diagnosis not present

## 2017-06-19 DIAGNOSIS — Z89422 Acquired absence of other left toe(s): Secondary | ICD-10-CM | POA: Diagnosis not present

## 2017-06-19 DIAGNOSIS — Z89511 Acquired absence of right leg below knee: Secondary | ICD-10-CM | POA: Diagnosis not present

## 2017-06-19 DIAGNOSIS — R531 Weakness: Secondary | ICD-10-CM | POA: Diagnosis not present

## 2017-06-19 DIAGNOSIS — M6281 Muscle weakness (generalized): Secondary | ICD-10-CM | POA: Diagnosis not present

## 2017-06-19 DIAGNOSIS — R262 Difficulty in walking, not elsewhere classified: Secondary | ICD-10-CM | POA: Diagnosis not present

## 2017-06-19 DIAGNOSIS — R2689 Other abnormalities of gait and mobility: Secondary | ICD-10-CM | POA: Diagnosis not present

## 2017-06-21 DIAGNOSIS — R262 Difficulty in walking, not elsewhere classified: Secondary | ICD-10-CM | POA: Diagnosis not present

## 2017-06-21 DIAGNOSIS — R531 Weakness: Secondary | ICD-10-CM | POA: Diagnosis not present

## 2017-06-21 DIAGNOSIS — I25728 Atherosclerosis of autologous artery coronary artery bypass graft(s) with other forms of angina pectoris: Secondary | ICD-10-CM | POA: Diagnosis not present

## 2017-06-21 DIAGNOSIS — I70208 Unspecified atherosclerosis of native arteries of extremities, other extremity: Secondary | ICD-10-CM | POA: Diagnosis not present

## 2017-06-21 DIAGNOSIS — I10 Essential (primary) hypertension: Secondary | ICD-10-CM | POA: Diagnosis not present

## 2017-06-21 DIAGNOSIS — M6281 Muscle weakness (generalized): Secondary | ICD-10-CM | POA: Diagnosis not present

## 2017-06-21 DIAGNOSIS — Z89422 Acquired absence of other left toe(s): Secondary | ICD-10-CM | POA: Diagnosis not present

## 2017-06-21 DIAGNOSIS — R2689 Other abnormalities of gait and mobility: Secondary | ICD-10-CM | POA: Diagnosis not present

## 2017-06-21 DIAGNOSIS — I5022 Chronic systolic (congestive) heart failure: Secondary | ICD-10-CM | POA: Diagnosis not present

## 2017-06-21 DIAGNOSIS — Z89511 Acquired absence of right leg below knee: Secondary | ICD-10-CM | POA: Diagnosis not present

## 2017-06-21 DIAGNOSIS — E119 Type 2 diabetes mellitus without complications: Secondary | ICD-10-CM | POA: Diagnosis not present

## 2017-06-21 DIAGNOSIS — E782 Mixed hyperlipidemia: Secondary | ICD-10-CM | POA: Diagnosis not present

## 2017-06-21 DIAGNOSIS — I879 Disorder of vein, unspecified: Secondary | ICD-10-CM | POA: Diagnosis not present

## 2017-06-25 DIAGNOSIS — R2689 Other abnormalities of gait and mobility: Secondary | ICD-10-CM | POA: Diagnosis not present

## 2017-06-25 DIAGNOSIS — R262 Difficulty in walking, not elsewhere classified: Secondary | ICD-10-CM | POA: Diagnosis not present

## 2017-06-25 DIAGNOSIS — Z89511 Acquired absence of right leg below knee: Secondary | ICD-10-CM | POA: Diagnosis not present

## 2017-06-25 DIAGNOSIS — R531 Weakness: Secondary | ICD-10-CM | POA: Diagnosis not present

## 2017-06-25 DIAGNOSIS — Z89422 Acquired absence of other left toe(s): Secondary | ICD-10-CM | POA: Diagnosis not present

## 2017-06-25 DIAGNOSIS — M6281 Muscle weakness (generalized): Secondary | ICD-10-CM | POA: Diagnosis not present

## 2017-06-26 ENCOUNTER — Telehealth (INDEPENDENT_AMBULATORY_CARE_PROVIDER_SITE_OTHER): Payer: Self-pay | Admitting: Orthopedic Surgery

## 2017-06-26 DIAGNOSIS — Z89511 Acquired absence of right leg below knee: Secondary | ICD-10-CM | POA: Diagnosis not present

## 2017-06-26 DIAGNOSIS — Z89422 Acquired absence of other left toe(s): Secondary | ICD-10-CM | POA: Diagnosis not present

## 2017-06-26 DIAGNOSIS — M6281 Muscle weakness (generalized): Secondary | ICD-10-CM | POA: Diagnosis not present

## 2017-06-26 DIAGNOSIS — R262 Difficulty in walking, not elsewhere classified: Secondary | ICD-10-CM | POA: Diagnosis not present

## 2017-06-26 DIAGNOSIS — R2689 Other abnormalities of gait and mobility: Secondary | ICD-10-CM | POA: Diagnosis not present

## 2017-06-26 DIAGNOSIS — R531 Weakness: Secondary | ICD-10-CM | POA: Diagnosis not present

## 2017-06-26 NOTE — Telephone Encounter (Signed)
[name] with [facility] called needing verbal orders to recertify patient for continued HH (PT) twice a week for 8 weeks. She advised may not use the wntire 8 weeks. The number to contact angela is [phone]

## 2017-06-27 DIAGNOSIS — I70213 Atherosclerosis of native arteries of extremities with intermittent claudication, bilateral legs: Secondary | ICD-10-CM | POA: Diagnosis not present

## 2017-06-27 HISTORY — PX: OTHER SURGICAL HISTORY: SHX169

## 2017-06-27 NOTE — Telephone Encounter (Signed)
I called and spoke with Levada Dy to give verbal ok for home health physical therapy.

## 2017-06-28 DIAGNOSIS — M6281 Muscle weakness (generalized): Secondary | ICD-10-CM | POA: Diagnosis not present

## 2017-06-28 DIAGNOSIS — R531 Weakness: Secondary | ICD-10-CM | POA: Diagnosis not present

## 2017-06-28 DIAGNOSIS — R2689 Other abnormalities of gait and mobility: Secondary | ICD-10-CM | POA: Diagnosis not present

## 2017-06-28 DIAGNOSIS — Z89511 Acquired absence of right leg below knee: Secondary | ICD-10-CM | POA: Diagnosis not present

## 2017-06-28 DIAGNOSIS — Z89422 Acquired absence of other left toe(s): Secondary | ICD-10-CM | POA: Diagnosis not present

## 2017-06-28 DIAGNOSIS — R262 Difficulty in walking, not elsewhere classified: Secondary | ICD-10-CM | POA: Diagnosis not present

## 2017-06-30 DIAGNOSIS — Z449 Encounter for fitting and adjustment of unspecified external prosthetic device: Secondary | ICD-10-CM | POA: Diagnosis not present

## 2017-06-30 DIAGNOSIS — I10 Essential (primary) hypertension: Secondary | ICD-10-CM | POA: Diagnosis not present

## 2017-06-30 DIAGNOSIS — S91102S Unspecified open wound of left great toe without damage to nail, sequela: Secondary | ICD-10-CM | POA: Diagnosis not present

## 2017-06-30 DIAGNOSIS — S91105S Unspecified open wound of left lesser toe(s) without damage to nail, sequela: Secondary | ICD-10-CM | POA: Diagnosis not present

## 2017-06-30 DIAGNOSIS — Z89422 Acquired absence of other left toe(s): Secondary | ICD-10-CM | POA: Diagnosis not present

## 2017-06-30 DIAGNOSIS — M6281 Muscle weakness (generalized): Secondary | ICD-10-CM | POA: Diagnosis not present

## 2017-06-30 DIAGNOSIS — Z89511 Acquired absence of right leg below knee: Secondary | ICD-10-CM | POA: Diagnosis not present

## 2017-06-30 DIAGNOSIS — R262 Difficulty in walking, not elsewhere classified: Secondary | ICD-10-CM | POA: Diagnosis not present

## 2017-06-30 DIAGNOSIS — R2689 Other abnormalities of gait and mobility: Secondary | ICD-10-CM | POA: Diagnosis not present

## 2017-07-06 ENCOUNTER — Ambulatory Visit: Payer: Medicare Other | Admitting: Internal Medicine

## 2017-07-06 DIAGNOSIS — I70213 Atherosclerosis of native arteries of extremities with intermittent claudication, bilateral legs: Secondary | ICD-10-CM | POA: Diagnosis not present

## 2017-07-09 DIAGNOSIS — R262 Difficulty in walking, not elsewhere classified: Secondary | ICD-10-CM | POA: Diagnosis not present

## 2017-07-09 DIAGNOSIS — S91102S Unspecified open wound of left great toe without damage to nail, sequela: Secondary | ICD-10-CM | POA: Diagnosis not present

## 2017-07-09 DIAGNOSIS — M6281 Muscle weakness (generalized): Secondary | ICD-10-CM | POA: Diagnosis not present

## 2017-07-09 DIAGNOSIS — Z449 Encounter for fitting and adjustment of unspecified external prosthetic device: Secondary | ICD-10-CM | POA: Diagnosis not present

## 2017-07-09 DIAGNOSIS — R2689 Other abnormalities of gait and mobility: Secondary | ICD-10-CM | POA: Diagnosis not present

## 2017-07-09 DIAGNOSIS — Z89511 Acquired absence of right leg below knee: Secondary | ICD-10-CM | POA: Diagnosis not present

## 2017-07-11 DIAGNOSIS — S91102S Unspecified open wound of left great toe without damage to nail, sequela: Secondary | ICD-10-CM | POA: Diagnosis not present

## 2017-07-11 DIAGNOSIS — Z89511 Acquired absence of right leg below knee: Secondary | ICD-10-CM | POA: Diagnosis not present

## 2017-07-11 DIAGNOSIS — M6281 Muscle weakness (generalized): Secondary | ICD-10-CM | POA: Diagnosis not present

## 2017-07-11 DIAGNOSIS — Z449 Encounter for fitting and adjustment of unspecified external prosthetic device: Secondary | ICD-10-CM | POA: Diagnosis not present

## 2017-07-11 DIAGNOSIS — R262 Difficulty in walking, not elsewhere classified: Secondary | ICD-10-CM | POA: Diagnosis not present

## 2017-07-11 DIAGNOSIS — R2689 Other abnormalities of gait and mobility: Secondary | ICD-10-CM | POA: Diagnosis not present

## 2017-07-13 DIAGNOSIS — I25728 Atherosclerosis of autologous artery coronary artery bypass graft(s) with other forms of angina pectoris: Secondary | ICD-10-CM | POA: Diagnosis not present

## 2017-07-13 DIAGNOSIS — I70213 Atherosclerosis of native arteries of extremities with intermittent claudication, bilateral legs: Secondary | ICD-10-CM | POA: Diagnosis not present

## 2017-07-16 ENCOUNTER — Other Ambulatory Visit: Payer: Self-pay | Admitting: Neurology

## 2017-07-16 DIAGNOSIS — R110 Nausea: Secondary | ICD-10-CM

## 2017-07-17 DIAGNOSIS — Z449 Encounter for fitting and adjustment of unspecified external prosthetic device: Secondary | ICD-10-CM | POA: Diagnosis not present

## 2017-07-17 DIAGNOSIS — R262 Difficulty in walking, not elsewhere classified: Secondary | ICD-10-CM | POA: Diagnosis not present

## 2017-07-17 DIAGNOSIS — M6281 Muscle weakness (generalized): Secondary | ICD-10-CM | POA: Diagnosis not present

## 2017-07-17 DIAGNOSIS — Z89511 Acquired absence of right leg below knee: Secondary | ICD-10-CM | POA: Diagnosis not present

## 2017-07-17 DIAGNOSIS — R2689 Other abnormalities of gait and mobility: Secondary | ICD-10-CM | POA: Diagnosis not present

## 2017-07-17 DIAGNOSIS — S91102S Unspecified open wound of left great toe without damage to nail, sequela: Secondary | ICD-10-CM | POA: Diagnosis not present

## 2017-07-19 DIAGNOSIS — Z89511 Acquired absence of right leg below knee: Secondary | ICD-10-CM | POA: Diagnosis not present

## 2017-07-19 DIAGNOSIS — R2689 Other abnormalities of gait and mobility: Secondary | ICD-10-CM | POA: Diagnosis not present

## 2017-07-19 DIAGNOSIS — M6281 Muscle weakness (generalized): Secondary | ICD-10-CM | POA: Diagnosis not present

## 2017-07-19 DIAGNOSIS — Z449 Encounter for fitting and adjustment of unspecified external prosthetic device: Secondary | ICD-10-CM | POA: Diagnosis not present

## 2017-07-19 DIAGNOSIS — S91102S Unspecified open wound of left great toe without damage to nail, sequela: Secondary | ICD-10-CM | POA: Diagnosis not present

## 2017-07-19 DIAGNOSIS — R262 Difficulty in walking, not elsewhere classified: Secondary | ICD-10-CM | POA: Diagnosis not present

## 2017-07-23 ENCOUNTER — Ambulatory Visit (INDEPENDENT_AMBULATORY_CARE_PROVIDER_SITE_OTHER): Payer: Medicare Other | Admitting: Internal Medicine

## 2017-07-23 ENCOUNTER — Encounter: Payer: Self-pay | Admitting: Internal Medicine

## 2017-07-23 VITALS — BP 134/76 | HR 76 | Resp 14 | Ht 65.0 in | Wt 175.0 lb

## 2017-07-23 DIAGNOSIS — I10 Essential (primary) hypertension: Secondary | ICD-10-CM

## 2017-07-23 DIAGNOSIS — Z23 Encounter for immunization: Secondary | ICD-10-CM

## 2017-07-23 DIAGNOSIS — I739 Peripheral vascular disease, unspecified: Secondary | ICD-10-CM | POA: Diagnosis not present

## 2017-07-23 DIAGNOSIS — R918 Other nonspecific abnormal finding of lung field: Secondary | ICD-10-CM

## 2017-07-23 DIAGNOSIS — I2510 Atherosclerotic heart disease of native coronary artery without angina pectoris: Secondary | ICD-10-CM | POA: Diagnosis not present

## 2017-07-23 DIAGNOSIS — R42 Dizziness and giddiness: Secondary | ICD-10-CM | POA: Diagnosis not present

## 2017-07-23 DIAGNOSIS — M810 Age-related osteoporosis without current pathological fracture: Secondary | ICD-10-CM | POA: Diagnosis not present

## 2017-07-23 MED ORDER — HYDROCODONE-ACETAMINOPHEN 5-325 MG PO TABS: 1.0000 | ORAL_TABLET | Freq: Four times a day (QID) | ORAL | 0 refills | Status: DC | PRN

## 2017-07-23 MED ORDER — LIDOCAINE 5 % EX PTCH: 1.0000 | MEDICATED_PATCH | CUTANEOUS | 3 refills | Status: DC

## 2017-07-23 NOTE — Patient Instructions (Signed)
GO TO THE LAB : Get the blood work     GO TO THE FRONT DESK Schedule your next appointment for a   checkup in 3-4 months

## 2017-07-23 NOTE — Progress Notes (Signed)
Subjective:    Patient ID: [name], female    DOB: 1935-04-22, 82 y.o.   MRN: [id number]  DOS:  07/23/2017 Type of visit - description : rov, here with her son from [place] Interval history: HTN: Good compliance of medication, ambulatory BPs usually 120s DM: Diet control, ambulatory CBGs 99 PVD: Few weeks ago had a echocardiogram, also was put a stent and the left leg. Things are going well. Pain management: Needs a refill of hydrocodone, currently she has a rx for 4 tablets daily and that seems to be working. Also having knee pain and was prescribed a lidocaine patch 5% elsewhere. Request a prescription. Will do.    Review of Systems Denies any chest pain, difficulty breathing. No cough or sputum production No diplopia or slurred speech  Past Medical History:  Diagnosis Date  . Abnormal echocardiogram    09/2013: mild LVH, mild focal basal hypertrophy of septum, EF 60-65%, normal WM, grade 1 diastolic dysfunction, MAC, mild LAE, ASA, PASP 34. Possible oscillating MV density - reviewed by MD and felt there was no significant abnormality other than MAC noted with mitral valve and did not require TEE.  . Adenomatous colon polyp   . Allergic rhinitis   . Arthritis    "back; ankles; hands; knees" (10/31/2013)  . Bilateral sensorineural hearing loss   . Bladder infection    frequently  . CAD (coronary artery disease)    a. Nonobst in 2011. b. Abnormal nuc 09/2013 -> s/p cutting balloon to D2, mild LAD disease; c. 08/2015 MV: no ischemia, EF 71%.  . Cancer ([place])    cancerous polyps  . Carcinoma in situ in a polyp 1994   a. 1994 - malignant polyp removed during colonoscopy.  . Charcot's Foot    a.  01/2016 s/p RLE transtibial amputation 2/2 Charcot rocker-bottom deformity and insensate neuropathy ulceration.  . Chronic diastolic CHF (congestive heart failure) ([place])    a. 09/2013 Echo: EF 60-65%, mild LVH, Gr1 DD.  Marland Kitchen Diverticulosis   . DJD (degenerative joint disease)   .  DVT (deep venous thrombosis) ([place]) 12/2007  . Dyspnea    a. Chronic, extensive w/u see OV note 09-2010. b. [place] 2011: ormal with RA 5 RV 31/2 PA 27/12 (19) PCW 10 CO normal. No evidence of shunting with sitting up in cath lab. c. CPX 2011: see report. d. Prior fluoro of diaphragm -  R diaphragm elevated at rest but both moved with inspiration.   . Fatty liver   . GERD (gastroesophageal reflux disease)    a. Hx GERD/esophageal dysmotility followed by Dr. Olevia Perches.   Marland Kitchen Head injury due to trauma   . Headache    Optic migraine  . Heart attack ([place])    2017  . Hemorrhoids   . Hiatal hernia    a. s/p Nissen fundoplication 8101.  Marland Kitchen Hyperlipidemia    a. patient unwilling to use statins.  . Hypertensive heart disease   . IBS (irritable bowel syndrome)   . Macular degeneration 03/2009   Dr. Rosana Hoes  . Macular degeneration   . Myocardial infarction ([place])   . Neuropathy    a. Hands, feet, legs.  . Orthostatic hypotension   . Osteomyelitis ([place])    a. 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 01/2016 s/p RLE transtibial amputation 2/2 Charcot rocker-bottom deformity and insensate neuropathy ulceration.  . Osteoporosis   . PE (pulmonary embolism) 12/2007   a. PE/DVT  after neck surgery 2009. b. coumadin d/c 10-2008.  Marland Kitchen Pneumonia 2015ish  . PONV (postoperative nausea and vomiting) 2009   neck surgery  . PPD positive   . Pulmonary nodule    incidental per CT:  Pet scan 4-9: likely benign, CT 08-2009 no change, no further CTs (Dr. Gwenette Greet)  . Recurrent UTI   . RSD (reflex sympathetic dystrophy)    a. Chronic pain.  Marland Kitchen Spinal stenosis   . Type II diabetes mellitus (H[facility])    no on medication  . Venous insufficiency    a. Contributing to LEE.    Past Surgical History:  Procedure Laterality Date  . AMPUTATION  03/06/2012   Procedure: AMPUTATION FOOT;  Surgeon: Newt Minion, MD;  Location: [facility];  Service: Orthopedics;  Laterality: Left;  FIFTH  RAY AMPUTATION   . AMPUTATION Right 02/18/2016   Procedure: AMPUTATION BELOW KNEE;  Surgeon: Newt Minion, MD;  Location: [facility];  Service: Orthopedics;  Laterality: Right;  . AMPUTATION Left 11/22/2016   Procedure: Amputation 4th Toe Left Foot at Metatarsophalangeal Joint;  Surgeon: Newt Minion, MD;  Location: [facility];  Service: Orthopedics;  Laterality: Left;  . ANKLE FUSION  09/27/2012   Procedure: ANKLE FUSION;  Surgeon: Newt Minion, MD;  Location: [facility];  Service: Orthopedics;  Laterality: Left;  Left Tibiocalcaneal Fusion  . ANKLE FUSION Right 05/09/2013   Procedure: ANKLE FUSION;  Surgeon: Newt Minion, MD;  Location: [facility];  Service: Orthopedics;  Laterality: Right;  Excision Osteomyelitis Base 1st MT Right Foot, Fusion Medial Column  . ANKLE SURGERY Left apr & june 1991  . ANTERIOR CERVICAL DECOMP/DISCECTOMY FUSION  12/18/07   For OA,  Dr. Lorin Mercy:  fu by a PE  . CARDIAC CATHETERIZATION  05/2010    at [facility]  . CARPAL TUNNEL RELEASE Left 09/2011  . CATARACT EXTRACTION W/ INTRAOCULAR LENS  IMPLANT, BILATERAL  2004   feb 2004 left, aug 2004 right  . CHOLECYSTECTOMY  03/2002  . COLONOSCOPY     numerous times  . CORONARY ANGIOPLASTY  10/31/2013  . CORONARY ARTERY BYPASS GRAFT  09/27/2016  . CORONARY ARTERY BYPASS GRAFT  09/2016   in [place].  . CYST REMOVAL HAND  06/2003  . FOOT BONE EXCISION Right 06/2009  . LAPAROSCOPIC RIGHT HEMI COLECTOMY N/A 01/08/2015   Procedure: LAPAROSCOPIC ASSISTED RIGHT HEMI COLECTOMY;  Surgeon: Johnathan Hausen, MD;  Location: [facility];  Service: General;  Laterality: N/A;  . LEFT HEART CATHETERIZATION WITH CORONARY ANGIOGRAM N/A 10/31/2013   Procedure: LEFT HEART CATHETERIZATION WITH CORONARY ANGIOGRAM;  Surgeon: Jettie Booze, MD;  Location: [facility] CATH LAB;  Service: Cardiovascular;  Laterality: N/A;  . NASAL SEPTUM SURGERY  09/1963  . NISSEN FUNDOPLICATION  05/24/9561  . PERCUTANEOUS CORONARY INTERVENTION-BALLOON ONLY  10/31/2013   Procedure: PERCUTANEOUS CORONARY  INTERVENTION-BALLOON ONLY;  Surgeon: Jettie Booze, MD;  Location: [facility] CATH LAB;  Service: Cardiovascular;;  . ROTATOR CUFF REPAIR Right 07/2007   Dr. Percell Miller  . ROTATOR CUFF REPAIR Left 11/2004  . TOE AMPUTATION Right 08/2008   3rd toe, Dr. Sharol Given due to osteomyelitis  . VAGINAL HYSTERECTOMY  03/1975  . VEIN LIGATION Bilateral 03/1966  . VENA CAVA FILTER PLACEMENT  01/2010   green filter; "due to blood clots"  . WISDOM TOOTH EXTRACTION  06/2007    Social History   Social History  . Marital status: Divorced    Spouse name: N/A  . Number of children: 3  . Years of  education: 15   Occupational History  . Retired      from [employer]    Social History Main Topics  . Smoking status: Never Smoker  . Smokeless tobacco: Never Used     Comment: tried for a few months in college.   . Alcohol use No  . Drug use: No  . Sexual activity: No   Other Topics Concern  . Not on file   Social History Narrative   Daughter lives w/ her and another daughter lives in town   1 son in [place]   Son comes home every 4 weeks to visit   Caffeine use:  Tea 1/day w/ dinner   Coffee occass                Allergies as of 07/23/2017      Reactions   Cymbalta [duloxetine Hcl] Swelling   Swelling in legs   Gabapentin Swelling   Swelling in legs Swelling in legs   Silicone Hives, Itching, Dermatitis, Rash   Metformin And Related Other (See Comments)   dizzy, tired, chills, diarrhea, and nausea   Adhesive [tape] Rash   rash rash   Cefazolin Hives   Hives Hives   Ciprofloxacin Other (See Comments)   Other reaction(s): Other (See Comments) Per pt, caused body aches Per pt, caused body aches   Clorazepate Dipotassium Other (See Comments)   Unknown reaction   Darifenacin Hydrobromide Other (See Comments)   hypotension, near syncope   Dilaudid [hydromorphone Hcl] Other (See Comments)    confused, intense itching   Doxycycline Rash   Enablex [darifenacin Hydrobromide Er] Other (See  Comments)   Hypotension, near syncope   Levofloxacin Other (See Comments)   Causes wrist pain   Lyrica [pregabalin] Swelling   Swelling in legs   Methadone Hcl Other (See Comments)   Reaction unknown   Morphine And Related Other (See Comments)   Confusion, constipation.   Talwin [pentazocine] Other (See Comments)   "climbing walls" anxiety      Medication List       Accurate as of 07/23/17 11:59 PM. Always use your most recent med list.          accu-chek soft touch lancets Check blood sugar no more than twice daily   Alpha-Lipoic Acid 600 MG Caps Take 600 mg by mouth 2 (two) times daily.   amitriptyline 10 MG tablet Commonly known as:  ELAVIL Start with 1 tablet at bedtime, increase to 2 or 3 tablets as needed for phantom pain   aspirin 81 MG tablet Take 81 mg by mouth daily.   atorvastatin 40 MG tablet Commonly known as:  LIPITOR Take 40 mg by mouth at bedtime.   carvedilol 25 MG tablet Commonly known as:  COREG Take 25 mg by mouth 2 (two) times daily with a meal.   clopidogrel 75 MG tablet Commonly known as:  PLAVIX Take 75 mg by mouth at bedtime.   diclofenac sodium 1 % Gel Commonly known as:  VOLTAREN APPLY 2-4 GRAMS TO AFFECTED AREA 3 TIMES DAILY AS NEEDED   furosemide 20 MG tablet Commonly known as:  LASIX Take 20 mg by mouth daily.   glucose blood test strip Commonly known as:  ACCU-CHEK GUIDE Check blood sugar no more than twice daily   HYDROcodone-acetaminophen 5-325 MG tablet Commonly known as:  NORCO Take 1 tablet by mouth every 6 (six) hours as needed.   HYDROcodone-acetaminophen 5-325 MG tablet Commonly known as:  NORCO Take 1  tablet by mouth every 6 (six) hours as needed.   HYDROcodone-acetaminophen 5-325 MG tablet Commonly known as:  NORCO Take 1 tablet by mouth every 6 (six) hours as needed.   hydrocortisone 25 MG suppository Commonly known as:  ANUSOL-HC Place 1 suppository (25 mg total) rectally every 12 (twelve) hours.     ibuprofen 200 MG tablet Commonly known as:  ADVIL,MOTRIN Take 200-400 mg by mouth every 8 (eight) hours as needed (for pain.).   lidocaine 5 % Commonly known as:  LIDODERM Place 1 patch onto the skin daily. Remove & Discard patch within 12 hours or as directed by MD   meclizine 25 MG tablet Commonly known as:  ANTIVERT Take 1 tablet (25 mg total) by mouth 2 (two) times daily as needed for dizziness.   nabumetone 750 MG tablet Commonly known as:  RELAFEN TAKE 1 TABLET BY MOUTH 2 TIMES DAILY WITH FOOD AS NEEDED FOR PAIN   nitroGLYCERIN 0.4 MG SL tablet Commonly known as:  NITROSTAT Place 0.4 mg under the tongue every 5 (five) minutes as needed for chest pain (x 3 doses). Reported on 04/14/2016   nitroGLYCERIN 0.2 mg/hr patch Commonly known as:  NITRODUR - Dosed in mg/24 hr Place 1 patch (0.2 mg total) onto the skin daily.   omeprazole 20 MG capsule Commonly known as:  PRILOSEC TAKE 1 CAPSULE BY MOUTH 2 TIMES DAILY   ondansetron 4 MG disintegrating tablet Commonly known as:  ZOFRAN-ODT TAKE 1 TABLET BY MOUTH EVERY 8 HOURS AS NEEDED FOR NAUSEA OR VOMITING   OVER THE COUNTER MEDICATION 1 tablet 3 (three) times daily. Berberorine Gluco Defense   OVER THE COUNTER MEDICATION Take 1 capsule by mouth 2 (two) times daily. Integrative Digestive Formula   PRESERVISION AREDS 2 Caps Take 1 capsule by mouth 2 (two) times daily.   REFRESH OP Place 1 drop into both eyes 3 (three) times daily as needed (dry eyes).   Vitamin B-12 500 MCG Subl Place 500 mcg under the tongue daily.   Vitamin D3 5000 units Caps Take 5,000 Units by mouth every evening. Reported on 11/24/2015          Objective:   Physical Exam BP 134/76 (BP Location: Right Arm, Patient Position: Sitting, Cuff Size: Normal)   Pulse 76   Resp 14   Ht 5' 5"  (1.651 m)   Wt 175 lb (79.4 kg) Comment: per Pt  SpO2 98%   BMI 29.12 kg/m  General:   Well developed, well nourished. She sits in a wheelchair, no distress   HEENT:  Normocephalic . Face symmetric, atraumatic Lungs:  CTA B Normal respiratory effort, no intercostal retractions, no accessory muscle use. Heart: RRR,  no murmur.  +/+++  pretibial edema, left leg Skin: Not pale. Not jaundice Neurologic:  alert & oriented X3.  Speech normal, gait not tested Psych--  Cognition and judgment appear intact.  Cooperative with normal attention span and concentration.  Behavior appropriate. No anxious or depressed appearing.      Assessment & Plan:   Assessment DM, Neuropathy, amputations due to Charcot feet. - metformin intolerant  (lethargic) HTN  Hyperlipidemia-- won't take statins  LUNG MASS ( incidental per CT 2009,  PET scan 01-2008: likely benign, CT 08-2009 no change, no further CTs per Dr Gwenette Greet. Had a CT in [place] "spiculated mass" CV: ---CAD sees Dr Meda Coffee and a cardiologist in [facility] --NSTEMI 09-2016. Outside hospital: Cath 2 vessel disease, CABG 2, LIMA to LAD and Diag   ---Chronic  diastolic CHF ---PVD: Aortogram and a stent L Leg Dr Feliberto Gottron, 06-27-2017 @ [facility] (per pt) GI: GERD, IBS, colon polyps, PUD, abdominal pain "post polypectomy syndrome" naueas meds prn (antivert-zofran, gets nausea when car traveling) Osteoporosis  MSK,pain mngmt : --Reflex sympathetic dystrophy --Neuropathy --DJD --Spinal stenosis  --H/o Osteomyelitis  Foot --s/p amputation:  2 L toes , R BKA  (01-2016, dx osteomyelitis) w/ phantom pain  -- sees Dr Sharol Given prn Takes nortriptyline also for leg cramps. She had local injections with Dr. Maia Petties 2016 w/o apparent help. She took oxycodone after surgery and that did NOT seem to help better than hydrocodone. Intolerant to Cymbalta, Neurontin ; Lyrica helped but caused edema. Recurrent UTIs -- bactrim q d Venous insufficiency  H/o  + PPD H/o hypothyroidism: TSHs stable H/o dyspnea  PLAN:  DM: Diet control, last A1c satisfactory at 5.6. HTN: Currently controlled on carvedilol, Lasix, check a  BMP Nausea, dizziness: Mostly related to car trips, used to be better on as needed Antivert and Zofran, now sxs are more noticeable, we could use scopolamine as a trial but I am concerned about potential for s/e. Patient decided not to pursue other medications. CAD, PVD: Recently had an echo and was told looks okay, had a stent on the left leg 06-2017, no apparent complications. DJD: Was prescribed a lidocaine patch 5% for knee pain, is working well for her, prescription printed to use as needed Pain management: 3 hydrocodone RXs provided and contract signed. Lung mass per CT:  Had a CT 09/2016 outside the system, no films but a report: 1.30.8 cm "spiculated mass" RML.  Overall she had similar findings few years ago I am still somewhat concerned given that the mass is spiculated. We'll arrange an nonurgent referral to pulmonary. Osteoporosis: T score 01/2017  -3.3. The patient is definitely is  not going to take Fosamax due to a FH intolerance, we talk about other options and she elected PROLIA. Will arrange. RTC 3-4 months

## 2017-07-23 NOTE — Progress Notes (Signed)
Pre visit review using our clinic review tool, if applicable. No additional management support is needed unless otherwise documented below in the visit note. 

## 2017-07-24 LAB — CBC WITH DIFFERENTIAL/PLATELET
Basophils Absolute: 0 10*3/uL (ref 0.0–0.1)
Basophils Relative: 0.7 % (ref 0.0–3.0)
Eosinophils Absolute: 0.1 10*3/uL (ref 0.0–0.7)
Eosinophils Relative: 2.1 % (ref 0.0–5.0)
HCT: 33.8 % — ABNORMAL LOW (ref 36.0–46.0)
Hemoglobin: 11.1 g/dL — ABNORMAL LOW (ref 12.0–15.0)
Lymphocytes Relative: 22.3 % (ref 12.0–46.0)
Lymphs Abs: 1.1 10*3/uL (ref 0.7–4.0)
MCHC: 32.8 g/dL (ref 30.0–36.0)
MCV: 88.7 fl (ref 78.0–100.0)
Monocytes Absolute: 0.3 10*3/uL (ref 0.1–1.0)
Monocytes Relative: 5.4 % (ref 3.0–12.0)
Neutro Abs: 3.6 10*3/uL (ref 1.4–7.7)
Neutrophils Relative %: 69.5 % (ref 43.0–77.0)
Platelets: 225 10*3/uL (ref 150.0–400.0)
RBC: 3.81 Mil/uL — ABNORMAL LOW (ref 3.87–5.11)
RDW: 14.3 % (ref 11.5–15.5)
WBC: 5.1 10*3/uL (ref 4.0–10.5)

## 2017-07-24 LAB — BASIC METABOLIC PANEL
BUN: 12 mg/dL (ref 6–23)
CO2: 27 mEq/L (ref 19–32)
Calcium: 9.3 mg/dL (ref 8.4–10.5)
Chloride: 94 mEq/L — ABNORMAL LOW (ref 96–112)
Creatinine, Ser: 1.03 mg/dL (ref 0.40–1.20)
GFR: 54.78 mL/min — ABNORMAL LOW (ref >60.00)
Glucose, Bld: 109 mg/dL — ABNORMAL HIGH (ref 70–99)
Potassium: 4.7 mEq/L (ref 3.5–5.1)
Sodium: 129 mEq/L — ABNORMAL LOW (ref 135–145)

## 2017-07-25 ENCOUNTER — Telehealth: Payer: Self-pay

## 2017-07-25 NOTE — Telephone Encounter (Signed)
Insurance verification initiated via [employer] portal. Awaiting benefits.

## 2017-07-25 NOTE — Assessment & Plan Note (Signed)
DM: Diet control, last A1c satisfactory at 5.6. HTN: Currently controlled on carvedilol, Lasix, check a BMP Nausea, dizziness: Mostly related to car trips, used to be better on as needed Antivert and Zofran, now sxs are more noticeable, we could use scopolamine as a trial but I am concerned about potential for s/e. Patient decided not to pursue other medications. CAD, PVD: Recently had an echo and was told looks okay, had a stent on the left leg 06-2017, no apparent complications. DJD: Was prescribed a lidocaine patch 5% for knee pain, is working well for her, prescription printed to use as needed Pain management: 3 hydrocodone RXs provided and contract signed. Lung mass per CT:  Had a CT 09/2016 outside the system, no films but a report: 1.30.8 cm "spiculated mass" RML.  Overall she had similar findings few years ago I am still somewhat concerned given that the mass is spiculated. We'll arrange an nonurgent referral to pulmonary. Osteoporosis: T score 01/2017  -3.3. The patient is definitely is  not going to take Fosamax due to a FH intolerance, we talk about other options and she elected PROLIA. Will arrange. RTC 3-4 months

## 2017-07-27 ENCOUNTER — Telehealth (INDEPENDENT_AMBULATORY_CARE_PROVIDER_SITE_OTHER): Payer: Self-pay | Admitting: Orthopedic Surgery

## 2017-07-27 NOTE — Telephone Encounter (Signed)
Levada Dy from [facility] called wanting to let Dr. Sharol Given know that the patient has missed both her PT appointments this week due to doctors appointments and will resume next week. CB # [phone]

## 2017-07-30 NOTE — Telephone Encounter (Signed)
Spoke w/ Pt, informed that her estimated OOP expense on PROLIA would be $0. Pt would still like to discuss medication w/ her family first and will call once she has made a decision.

## 2017-07-31 DIAGNOSIS — Z449 Encounter for fitting and adjustment of unspecified external prosthetic device: Secondary | ICD-10-CM | POA: Diagnosis not present

## 2017-07-31 DIAGNOSIS — R262 Difficulty in walking, not elsewhere classified: Secondary | ICD-10-CM | POA: Diagnosis not present

## 2017-07-31 DIAGNOSIS — S91102S Unspecified open wound of left great toe without damage to nail, sequela: Secondary | ICD-10-CM | POA: Diagnosis not present

## 2017-07-31 DIAGNOSIS — M6281 Muscle weakness (generalized): Secondary | ICD-10-CM | POA: Diagnosis not present

## 2017-07-31 DIAGNOSIS — Z89511 Acquired absence of right leg below knee: Secondary | ICD-10-CM | POA: Diagnosis not present

## 2017-07-31 DIAGNOSIS — R2689 Other abnormalities of gait and mobility: Secondary | ICD-10-CM | POA: Diagnosis not present

## 2017-08-03 DIAGNOSIS — S91102S Unspecified open wound of left great toe without damage to nail, sequela: Secondary | ICD-10-CM | POA: Diagnosis not present

## 2017-08-03 DIAGNOSIS — R262 Difficulty in walking, not elsewhere classified: Secondary | ICD-10-CM | POA: Diagnosis not present

## 2017-08-03 DIAGNOSIS — Z449 Encounter for fitting and adjustment of unspecified external prosthetic device: Secondary | ICD-10-CM | POA: Diagnosis not present

## 2017-08-03 DIAGNOSIS — Z89511 Acquired absence of right leg below knee: Secondary | ICD-10-CM | POA: Diagnosis not present

## 2017-08-03 DIAGNOSIS — M6281 Muscle weakness (generalized): Secondary | ICD-10-CM | POA: Diagnosis not present

## 2017-08-03 DIAGNOSIS — R2689 Other abnormalities of gait and mobility: Secondary | ICD-10-CM | POA: Diagnosis not present

## 2017-08-07 DIAGNOSIS — R2689 Other abnormalities of gait and mobility: Secondary | ICD-10-CM | POA: Diagnosis not present

## 2017-08-07 DIAGNOSIS — Z89511 Acquired absence of right leg below knee: Secondary | ICD-10-CM | POA: Diagnosis not present

## 2017-08-07 DIAGNOSIS — R262 Difficulty in walking, not elsewhere classified: Secondary | ICD-10-CM | POA: Diagnosis not present

## 2017-08-07 DIAGNOSIS — Z449 Encounter for fitting and adjustment of unspecified external prosthetic device: Secondary | ICD-10-CM | POA: Diagnosis not present

## 2017-08-07 DIAGNOSIS — M6281 Muscle weakness (generalized): Secondary | ICD-10-CM | POA: Diagnosis not present

## 2017-08-07 DIAGNOSIS — S91102S Unspecified open wound of left great toe without damage to nail, sequela: Secondary | ICD-10-CM | POA: Diagnosis not present

## 2017-08-10 DIAGNOSIS — Z89511 Acquired absence of right leg below knee: Secondary | ICD-10-CM | POA: Diagnosis not present

## 2017-08-10 DIAGNOSIS — M6281 Muscle weakness (generalized): Secondary | ICD-10-CM | POA: Diagnosis not present

## 2017-08-10 DIAGNOSIS — S91102S Unspecified open wound of left great toe without damage to nail, sequela: Secondary | ICD-10-CM | POA: Diagnosis not present

## 2017-08-10 DIAGNOSIS — R2689 Other abnormalities of gait and mobility: Secondary | ICD-10-CM | POA: Diagnosis not present

## 2017-08-10 DIAGNOSIS — R262 Difficulty in walking, not elsewhere classified: Secondary | ICD-10-CM | POA: Diagnosis not present

## 2017-08-10 DIAGNOSIS — Z449 Encounter for fitting and adjustment of unspecified external prosthetic device: Secondary | ICD-10-CM | POA: Diagnosis not present

## 2017-08-14 DIAGNOSIS — S91102S Unspecified open wound of left great toe without damage to nail, sequela: Secondary | ICD-10-CM | POA: Diagnosis not present

## 2017-08-14 DIAGNOSIS — Z449 Encounter for fitting and adjustment of unspecified external prosthetic device: Secondary | ICD-10-CM | POA: Diagnosis not present

## 2017-08-14 DIAGNOSIS — R2689 Other abnormalities of gait and mobility: Secondary | ICD-10-CM | POA: Diagnosis not present

## 2017-08-14 DIAGNOSIS — Z89511 Acquired absence of right leg below knee: Secondary | ICD-10-CM | POA: Diagnosis not present

## 2017-08-14 DIAGNOSIS — M6281 Muscle weakness (generalized): Secondary | ICD-10-CM | POA: Diagnosis not present

## 2017-08-14 DIAGNOSIS — R262 Difficulty in walking, not elsewhere classified: Secondary | ICD-10-CM | POA: Diagnosis not present

## 2017-08-17 DIAGNOSIS — R2689 Other abnormalities of gait and mobility: Secondary | ICD-10-CM | POA: Diagnosis not present

## 2017-08-17 DIAGNOSIS — Z89511 Acquired absence of right leg below knee: Secondary | ICD-10-CM | POA: Diagnosis not present

## 2017-08-17 DIAGNOSIS — R262 Difficulty in walking, not elsewhere classified: Secondary | ICD-10-CM | POA: Diagnosis not present

## 2017-08-17 DIAGNOSIS — S91102S Unspecified open wound of left great toe without damage to nail, sequela: Secondary | ICD-10-CM | POA: Diagnosis not present

## 2017-08-17 DIAGNOSIS — Z449 Encounter for fitting and adjustment of unspecified external prosthetic device: Secondary | ICD-10-CM | POA: Diagnosis not present

## 2017-08-17 DIAGNOSIS — M6281 Muscle weakness (generalized): Secondary | ICD-10-CM | POA: Diagnosis not present

## 2017-08-20 ENCOUNTER — Telehealth (INDEPENDENT_AMBULATORY_CARE_PROVIDER_SITE_OTHER): Payer: Self-pay | Admitting: Orthopedic Surgery

## 2017-08-20 DIAGNOSIS — M6281 Muscle weakness (generalized): Secondary | ICD-10-CM | POA: Diagnosis not present

## 2017-08-20 DIAGNOSIS — S91102S Unspecified open wound of left great toe without damage to nail, sequela: Secondary | ICD-10-CM | POA: Diagnosis not present

## 2017-08-20 DIAGNOSIS — R262 Difficulty in walking, not elsewhere classified: Secondary | ICD-10-CM | POA: Diagnosis not present

## 2017-08-20 DIAGNOSIS — Z89511 Acquired absence of right leg below knee: Secondary | ICD-10-CM | POA: Diagnosis not present

## 2017-08-20 DIAGNOSIS — R2689 Other abnormalities of gait and mobility: Secondary | ICD-10-CM | POA: Diagnosis not present

## 2017-08-20 DIAGNOSIS — Z449 Encounter for fitting and adjustment of unspecified external prosthetic device: Secondary | ICD-10-CM | POA: Diagnosis not present

## 2017-08-20 NOTE — Telephone Encounter (Signed)
[name], [name] 03-06-1935   Home Health Care Physical Therapist   Discharging from [facility] on 08/22/2017.Pt wants to continue with outpatient therapy. Please fax prescription for outpatient treatment to the following number @Fax  [phone]. Please let angela know if [name] will need to be seen before prescription is written.

## 2017-08-21 NOTE — Telephone Encounter (Signed)
Written rx for therapy, will fax today.

## 2017-08-22 ENCOUNTER — Telehealth: Payer: Self-pay | Admitting: Internal Medicine

## 2017-08-22 DIAGNOSIS — R262 Difficulty in walking, not elsewhere classified: Secondary | ICD-10-CM | POA: Diagnosis not present

## 2017-08-22 DIAGNOSIS — M6281 Muscle weakness (generalized): Secondary | ICD-10-CM | POA: Diagnosis not present

## 2017-08-22 DIAGNOSIS — Z449 Encounter for fitting and adjustment of unspecified external prosthetic device: Secondary | ICD-10-CM | POA: Diagnosis not present

## 2017-08-22 DIAGNOSIS — Z89511 Acquired absence of right leg below knee: Secondary | ICD-10-CM | POA: Diagnosis not present

## 2017-08-22 DIAGNOSIS — E871 Hypo-osmolality and hyponatremia: Secondary | ICD-10-CM

## 2017-08-22 DIAGNOSIS — S91102S Unspecified open wound of left great toe without damage to nail, sequela: Secondary | ICD-10-CM | POA: Diagnosis not present

## 2017-08-22 DIAGNOSIS — R2689 Other abnormalities of gait and mobility: Secondary | ICD-10-CM | POA: Diagnosis not present

## 2017-08-22 NOTE — Telephone Encounter (Signed)
Patient scheduled for labs only 08/24/2017 at 10:15am

## 2017-08-22 NOTE — Telephone Encounter (Signed)
BMP ordered for hyponatremia (low sodium), please schedule lab appt to be completed within next week if possible [non-fasting]. Thank you.

## 2017-08-22 NOTE — Telephone Encounter (Signed)
°  Relation to AL:PFXT Call back number:[phone] Pharmacy:  Reason for call:  Daughter states PCP wanted patient to repeat labs, chart doesn't reflect orders, please advise

## 2017-08-24 ENCOUNTER — Other Ambulatory Visit (INDEPENDENT_AMBULATORY_CARE_PROVIDER_SITE_OTHER): Payer: Medicare Other

## 2017-08-24 DIAGNOSIS — E871 Hypo-osmolality and hyponatremia: Secondary | ICD-10-CM

## 2017-08-24 DIAGNOSIS — R8271 Bacteriuria: Secondary | ICD-10-CM | POA: Diagnosis not present

## 2017-08-24 LAB — BASIC METABOLIC PANEL
BUN: 24 mg/dL — ABNORMAL HIGH (ref 6–23)
CO2: 26 mEq/L (ref 19–32)
Calcium: 8.9 mg/dL (ref 8.4–10.5)
Chloride: 107 mEq/L (ref 96–112)
Creatinine, Ser: 1.2 mg/dL (ref 0.40–1.20)
GFR: 45.92 mL/min — ABNORMAL LOW (ref >60.00)
Glucose, Bld: 123 mg/dL — ABNORMAL HIGH (ref 70–99)
Potassium: 4.5 mEq/L (ref 3.5–5.1)
Sodium: 140 mEq/L (ref 135–145)

## 2017-08-28 DIAGNOSIS — R002 Palpitations: Secondary | ICD-10-CM | POA: Diagnosis not present

## 2017-08-29 DIAGNOSIS — G8921 Chronic pain due to trauma: Secondary | ICD-10-CM | POA: Diagnosis not present

## 2017-08-29 DIAGNOSIS — R2689 Other abnormalities of gait and mobility: Secondary | ICD-10-CM | POA: Diagnosis not present

## 2017-08-29 DIAGNOSIS — M6281 Muscle weakness (generalized): Secondary | ICD-10-CM | POA: Diagnosis not present

## 2017-08-29 DIAGNOSIS — G8929 Other chronic pain: Secondary | ICD-10-CM | POA: Diagnosis not present

## 2017-08-29 DIAGNOSIS — M25562 Pain in left knee: Secondary | ICD-10-CM | POA: Diagnosis not present

## 2017-08-29 DIAGNOSIS — G894 Chronic pain syndrome: Secondary | ICD-10-CM | POA: Diagnosis not present

## 2017-08-29 DIAGNOSIS — M545 Low back pain: Secondary | ICD-10-CM | POA: Diagnosis not present

## 2017-08-31 DIAGNOSIS — M25562 Pain in left knee: Secondary | ICD-10-CM | POA: Diagnosis not present

## 2017-08-31 DIAGNOSIS — G8929 Other chronic pain: Secondary | ICD-10-CM | POA: Diagnosis not present

## 2017-08-31 DIAGNOSIS — M545 Low back pain: Secondary | ICD-10-CM | POA: Diagnosis not present

## 2017-08-31 DIAGNOSIS — G8921 Chronic pain due to trauma: Secondary | ICD-10-CM | POA: Diagnosis not present

## 2017-08-31 DIAGNOSIS — G894 Chronic pain syndrome: Secondary | ICD-10-CM | POA: Diagnosis not present

## 2017-08-31 DIAGNOSIS — M6281 Muscle weakness (generalized): Secondary | ICD-10-CM | POA: Diagnosis not present

## 2017-08-31 DIAGNOSIS — R2689 Other abnormalities of gait and mobility: Secondary | ICD-10-CM | POA: Diagnosis not present

## 2017-09-04 DIAGNOSIS — G8921 Chronic pain due to trauma: Secondary | ICD-10-CM | POA: Diagnosis not present

## 2017-09-04 DIAGNOSIS — G894 Chronic pain syndrome: Secondary | ICD-10-CM | POA: Diagnosis not present

## 2017-09-04 DIAGNOSIS — M545 Low back pain: Secondary | ICD-10-CM | POA: Diagnosis not present

## 2017-09-04 DIAGNOSIS — R2689 Other abnormalities of gait and mobility: Secondary | ICD-10-CM | POA: Diagnosis not present

## 2017-09-04 DIAGNOSIS — M6281 Muscle weakness (generalized): Secondary | ICD-10-CM | POA: Diagnosis not present

## 2017-09-04 DIAGNOSIS — G8929 Other chronic pain: Secondary | ICD-10-CM | POA: Diagnosis not present

## 2017-09-04 DIAGNOSIS — M25562 Pain in left knee: Secondary | ICD-10-CM | POA: Diagnosis not present

## 2017-09-06 DIAGNOSIS — R002 Palpitations: Secondary | ICD-10-CM | POA: Diagnosis not present

## 2017-09-06 DIAGNOSIS — M25562 Pain in left knee: Secondary | ICD-10-CM | POA: Diagnosis not present

## 2017-09-06 DIAGNOSIS — M6281 Muscle weakness (generalized): Secondary | ICD-10-CM | POA: Diagnosis not present

## 2017-09-06 DIAGNOSIS — G894 Chronic pain syndrome: Secondary | ICD-10-CM | POA: Diagnosis not present

## 2017-09-06 DIAGNOSIS — M545 Low back pain: Secondary | ICD-10-CM | POA: Diagnosis not present

## 2017-09-06 DIAGNOSIS — R2689 Other abnormalities of gait and mobility: Secondary | ICD-10-CM | POA: Diagnosis not present

## 2017-09-06 DIAGNOSIS — G8921 Chronic pain due to trauma: Secondary | ICD-10-CM | POA: Diagnosis not present

## 2017-09-06 DIAGNOSIS — G8929 Other chronic pain: Secondary | ICD-10-CM | POA: Diagnosis not present

## 2017-09-15 ENCOUNTER — Other Ambulatory Visit (INDEPENDENT_AMBULATORY_CARE_PROVIDER_SITE_OTHER): Payer: Self-pay | Admitting: Orthopedic Surgery

## 2017-09-20 ENCOUNTER — Encounter: Payer: Self-pay | Admitting: Gastroenterology

## 2017-09-20 ENCOUNTER — Ambulatory Visit (INDEPENDENT_AMBULATORY_CARE_PROVIDER_SITE_OTHER): Payer: Medicare Other | Admitting: Emergency Medicine

## 2017-09-20 ENCOUNTER — Ambulatory Visit (INDEPENDENT_AMBULATORY_CARE_PROVIDER_SITE_OTHER): Payer: Medicare Other | Admitting: Gastroenterology

## 2017-09-20 ENCOUNTER — Encounter: Payer: Self-pay | Admitting: Emergency Medicine

## 2017-09-20 VITALS — BP 124/68 | HR 78 | Ht 63.0 in

## 2017-09-20 DIAGNOSIS — R911 Solitary pulmonary nodule: Secondary | ICD-10-CM | POA: Diagnosis not present

## 2017-09-20 DIAGNOSIS — I2510 Atherosclerotic heart disease of native coronary artery without angina pectoris: Secondary | ICD-10-CM | POA: Diagnosis not present

## 2017-09-20 DIAGNOSIS — K582 Mixed irritable bowel syndrome: Secondary | ICD-10-CM | POA: Diagnosis not present

## 2017-09-20 DIAGNOSIS — R1031 Right lower quadrant pain: Secondary | ICD-10-CM | POA: Diagnosis not present

## 2017-09-20 DIAGNOSIS — R002 Palpitations: Secondary | ICD-10-CM | POA: Diagnosis not present

## 2017-09-20 NOTE — Progress Notes (Signed)
[name]    [id number]    Mar 17, 1935  Primary Care Physician:Paz, Alda Berthold, MD  Referring Physician: Colon Branch, [address] [place], [place] [address]  Chief complaint: Constipation alternating with diarrhea  HPI:  82 year old female here for follow-up visit with complaints of constipation alternating with diarrhea.  She is still living with her son in [place].  She had a prolonged hospitalization last year with NSTEMI, underwent CABG. she had partial amputation of lower extremity, has a prosthetic right lower limb.  She had stent placement in the left lower extremity for peripheral vascular disease.  She has been having alternating constipation and diarrhea since her hospitalization last year, some time she does not have a bowel movement for a week followed by multiple bowel movements for next 1-2 days.  She thinks her symptoms are slightly improved in the past 2 weeks and she is currently having bowel movement every 2-3 days followed by multiple bowel movements for a day.  She is not on any laxatives.  She is going through physical therapy, in a wheelchair and tries to use walker when at home.  Denies any nausea, vomiting, melena or bright red blood per rectum. She is also complaining of intermittent right lower quadrant abdominal pain, worse with activity.  No relationship to bowel movements or diet.   Colonoscopy 12/29/2015 with removal of 3 small polyps (tubular adenomas), internal hemorrhoids and left-sided diverticulosis   Outpatient Encounter Medications as of 09/20/2017  Medication Sig  . Alpha-Lipoic Acid 600 MG CAPS Take 600 mg by mouth 2 (two) times daily.  Marland Kitchen amitriptyline (ELAVIL) 10 MG tablet Start with 1 tablet at bedtime, increase to 2 or 3 tablets as needed for phantom pain  . aspirin 81 MG tablet Take 81 mg by mouth daily.  Marland Kitchen atorvastatin (LIPITOR) 40 MG tablet Take 40 mg by mouth at bedtime.   . carvedilol (COREG) 25 MG tablet  Take 25 mg by mouth 2 (two) times daily with a meal.  . Cholecalciferol (VITAMIN D3) 5000 UNITS CAPS Take 5,000 Units by mouth every evening. Reported on 11/24/2015  . clopidogrel (PLAVIX) 75 MG tablet Take 75 mg by mouth at bedtime.   . Cyanocobalamin (VITAMIN B-12) 500 MCG SUBL Place 500 mcg under the tongue daily.  . diclofenac sodium (VOLTAREN) 1 % GEL APPLY 2-4 GRAMS TO AFFECTED AREA 3 TIMES DAILY AS NEEDED  . furosemide (LASIX) 20 MG tablet Take 20 mg by mouth daily.  Marland Kitchen glucose blood (ACCU-CHEK GUIDE) test strip Check blood sugar no more than twice daily  . HYDROcodone-acetaminophen (NORCO) 5-325 MG tablet Take 1 tablet by mouth every 6 (six) hours as needed.  . hydrocortisone (ANUSOL-HC) 25 MG suppository Place 1 suppository (25 mg total) rectally every 12 (twelve) hours.  Marland Kitchen ibuprofen (ADVIL,MOTRIN) 200 MG tablet Take 200-400 mg by mouth every 8 (eight) hours as needed (for pain.).  Marland Kitchen Lancets (ACCU-CHEK SOFT TOUCH) lancets Check blood sugar no more than twice daily  . lidocaine (LIDODERM) 5 % Place 1 patch onto the skin daily. Remove & Discard patch within 12 hours or as directed by MD  . meclizine (ANTIVERT) 25 MG tablet Take 1 tablet (25 mg total) by mouth 2 (two) times daily as needed for dizziness.  . Multiple Vitamins-Minerals (PRESERVISION AREDS 2) CAPS Take 1 capsule by mouth 2 (two) times daily.  . nabumetone (RELAFEN) 750 MG tablet TAKE 1 TABLET BY  MOUTH TWICE A DAY IF NEEDED FOR PAIN WITH FOOD  . nitroGLYCERIN (NITRODUR - DOSED IN MG/24 HR) 0.2 mg/hr patch Place 1 patch (0.2 mg total) onto the skin daily.  . nitroGLYCERIN (NITROSTAT) 0.4 MG SL tablet Place 0.4 mg under the tongue every 5 (five) minutes as needed for chest pain (x 3 doses). Reported on 04/14/2016  . omeprazole (PRILOSEC) 20 MG capsule TAKE 1 CAPSULE BY MOUTH 2 TIMES DAILY  . ondansetron (ZOFRAN-ODT) 4 MG disintegrating tablet TAKE 1 TABLET BY MOUTH EVERY 8 HOURS AS NEEDED FOR NAUSEA OR VOMITING  . OVER THE COUNTER  MEDICATION Take 1 capsule by mouth 2 (two) times daily. Integrative Digestive Formula  . OVER THE COUNTER MEDICATION 1 tablet 3 (three) times daily. [place]orine Gluco Defense   . Polyvinyl Alcohol-Povidone (REFRESH OP) Place 1 drop into both eyes 3 (three) times daily as needed (dry eyes).   . [DISCONTINUED] HYDROcodone-acetaminophen (NORCO) 5-325 MG tablet Take 1 tablet by mouth every 6 (six) hours as needed.  . [DISCONTINUED] HYDROcodone-acetaminophen (NORCO) 5-325 MG tablet Take 1 tablet by mouth every 6 (six) hours as needed.   No facility-administered encounter medications on file as of 09/20/2017.     Allergies as of 09/20/2017 - Review Complete 09/20/2017  Allergen Reaction Noted  . Cymbalta [duloxetine hcl] Swelling 09/25/2012  . Gabapentin Swelling 09/25/2012  . Silicone Hives, Itching, Dermatitis, and Rash   . Metformin and related Other (See Comments) 12/10/2015  . Adhesive [tape] Rash 07/17/2011  . Cefazolin Hives 03/06/2012  . Ciprofloxacin Other (See Comments) 01/31/2013  . Clorazepate dipotassium Other (See Comments)   . Darifenacin hydrobromide Other (See Comments) 03/30/2009  . Dilaudid [hydromorphone hcl] Other (See Comments) 03/11/2010  . Doxycycline Rash 11/03/2008  . Enablex [darifenacin hydrobromide er] Other (See Comments) 03/06/2012  . Levofloxacin Other (See Comments) 01/08/2014  . Lyrica [pregabalin] Swelling 05/08/2013  . Methadone hcl Other (See Comments)   . Morphine and related Other (See Comments) 10/31/2013  . Talwin [pentazocine] Other (See Comments) 12/04/2012    Past Medical History:  Diagnosis Date  . Abnormal echocardiogram    09/2013: mild LVH, mild focal basal hypertrophy of septum, EF 60-65%, normal WM, grade 1 diastolic dysfunction, MAC, mild LAE, ASA, PASP 34. Possible oscillating MV density - reviewed by MD and felt there was no significant abnormality other than MAC noted with mitral valve and did not require TEE.  . Adenomatous colon  polyp   . Allergic rhinitis   . Arthritis    "back; ankles; hands; knees" (10/31/2013)  . Bilateral sensorineural hearing loss   . Bladder infection    frequently  . CAD (coronary artery disease)    a. Nonobst in 2011. b. Abnormal nuc 09/2013 -> s/p cutting balloon to D2, mild LAD disease; c. 08/2015 MV: no ischemia, EF 71%.  . Cancer ([facility])    cancerous polyps  . Carcinoma in situ in a polyp 1994   a. 1994 - malignant polyp removed during colonoscopy.  . Charcot's Foot    a.  01/2016 s/p RLE transtibial amputation 2/2 Charcot rocker-bottom deformity and insensate neuropathy ulceration.  . Chronic diastolic CHF (congestive heart failure) ([facility])    a. 09/2013 Echo: EF 60-65%, mild LVH, Gr1 DD.  Marland Kitchen Diverticulosis   . DJD (degenerative joint disease)   . DVT (deep venous thrombosis) ([facility]) 12/2007  . Dyspnea    a. Chronic, extensive w/u see OV note 09-2010. b. [place] 2011: ormal with RA 5 RV 31/2 PA 27/12 (19) PCW 10  CO normal. No evidence of shunting with sitting up in cath lab. c. CPX 2011: see report. d. Prior fluoro of diaphragm -  R diaphragm elevated at rest but both moved with inspiration.   . Fatty liver   . GERD (gastroesophageal reflux disease)    a. Hx GERD/esophageal dysmotility followed by Dr. Olevia Perches.   Marland Kitchen Head injury due to trauma   . Headache    Optic migraine  . Heart attack ([facility])    2017  . Hemorrhoids   . Hiatal hernia    a. s/p Nissen fundoplication 6314.  Marland Kitchen Hyperlipidemia    a. patient unwilling to use statins.  . Hypertensive heart disease   . IBS (irritable bowel syndrome)   . Macular degeneration 03/2009   Dr. Rosana Hoes  . Macular degeneration   . Myocardial infarction ([facility])   . Neuropathy    a. Hands, feet, legs.  . Orthostatic hypotension   . Osteomyelitis ([facility])    a. 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 01/2016 s/p RLE transtibial amputation 2/2 Charcot rocker-bottom deformity and insensate neuropathy  ulceration.  . Osteoporosis   . PE (pulmonary embolism) 12/2007   a. PE/DVT after neck surgery 2009. b. coumadin d/c 10-2008.  Marland Kitchen Pneumonia 2015ish  . PONV (postoperative nausea and vomiting) 2009   neck surgery  . PPD positive   . Pulmonary nodule    incidental per CT:  Pet scan 4-9: likely benign, CT 08-2009 no change, no further CTs (Dr. Gwenette Greet)  . Recurrent UTI   . RSD (reflex sympathetic dystrophy)    a. Chronic pain.  Marland Kitchen Spinal stenosis   . Type II diabetes mellitus ([facility])    no on medication  . Venous insufficiency    a. Contributing to LEE.    Past Surgical History:  Procedure Laterality Date  . AMPUTATION  03/06/2012   Procedure: AMPUTATION FOOT;  Surgeon: Newt Minion, MD;  Location: [facility];  Service: Orthopedics;  Laterality: Left;  FIFTH RAY AMPUTATION   . AMPUTATION Right 02/18/2016   Procedure: AMPUTATION BELOW KNEE;  Surgeon: Newt Minion, MD;  Location: [facility];  Service: Orthopedics;  Laterality: Right;  . AMPUTATION Left 11/22/2016   Procedure: Amputation 4th Toe Left Foot at Metatarsophalangeal Joint;  Surgeon: Newt Minion, MD;  Location: [facility];  Service: Orthopedics;  Laterality: Left;  . ANKLE FUSION  09/27/2012   Procedure: ANKLE FUSION;  Surgeon: Newt Minion, MD;  Location: [facility];  Service: Orthopedics;  Laterality: Left;  Left Tibiocalcaneal Fusion  . ANKLE FUSION Right 05/09/2013   Procedure: ANKLE FUSION;  Surgeon: Newt Minion, MD;  Location: [facility];  Service: Orthopedics;  Laterality: Right;  Excision Osteomyelitis Base 1st MT Right Foot, Fusion Medial Column  . ANKLE SURGERY Left apr & june 1991  . ANTERIOR CERVICAL DECOMP/DISCECTOMY FUSION  12/18/07   For OA,  Dr. Lorin Mercy:  fu by a PE  . CARDIAC CATHETERIZATION  05/2010    at [facility]  . CARPAL TUNNEL RELEASE Left 09/2011  . CATARACT EXTRACTION W/ INTRAOCULAR LENS  IMPLANT, BILATERAL  2004   feb 2004 left, aug 2004 right  . CHOLECYSTECTOMY  03/2002  . COLONOSCOPY     numerous times  . CORONARY  ANGIOPLASTY  10/31/2013  . CORONARY ARTERY BYPASS GRAFT  09/27/2016  . CORONARY ARTERY BYPASS GRAFT  09/2016   in [place].  . CYST REMOVAL HAND  06/2003  . FOOT BONE EXCISION  Right 06/2009  . LAPAROSCOPIC RIGHT HEMI COLECTOMY N/A 01/08/2015   Procedure: LAPAROSCOPIC ASSISTED RIGHT HEMI COLECTOMY;  Surgeon: Johnathan Hausen, MD;  Location: [facility];  Service: General;  Laterality: N/A;  . LEFT HEART CATHETERIZATION WITH CORONARY ANGIOGRAM N/A 10/31/2013   Procedure: LEFT HEART CATHETERIZATION WITH CORONARY ANGIOGRAM;  Surgeon: Jettie Booze, MD;  Location: [facility] C[facility] LAB;  Service: Cardiovascular;  Laterality: N/A;  . NASAL SEPTUM SURGERY  09/1963  . NISSEN FUNDOPLICATION  0/04/3709  . PERCUTANEOUS CORONARY INTERVENTION-BALLOON ONLY  10/31/2013   Procedure: PERCUTANEOUS CORONARY INTERVENTION-BALLOON ONLY;  Surgeon: Jettie Booze, MD;  Location: [facility] CATH LAB;  Service: Cardiovascular;;  . ROTATOR CUFF REPAIR Right 07/2007   Dr. Percell Miller  . ROTATOR CUFF REPAIR Left 11/2004  . stent in left leg, behind knee  06/27/2017   done at [facility] in [facility]  . TOE AMPUTATION Right 08/2008   3rd toe, Dr. Sharol Given due to osteomyelitis  . VAGINAL HYSTERECTOMY  03/1975  . VEIN LIGATION Bilateral 03/1966  . VENA CAVA FILTER PLACEMENT  01/2010   green filter; "due to blood clots"  . WISDOM TOOTH EXTRACTION  06/2007    Family History  Problem Relation Age of Onset  . Heart disease Mother        mitral valve replaced  . Arthritis Mother   . Diabetic kidney disease Daughter   . Diabetes Father   . Stroke Father   . Migraines Sister   . Hypertension Unknown   . Breast cancer Unknown   . Headache Sister   . Colon cancer Neg Hx   . Esophageal cancer Neg Hx   . Stomach cancer Neg Hx   . Rectal cancer Neg Hx     Social History   Socioeconomic History  . Marital status: Divorced    Spouse name: Not on file  . Number of children: 3  . Years of education: 21  . Highest education level: Not on file    Social Needs  . Financial resource strain: Not on file  . Food insecurity - worry: Not on file  . Food insecurity - inability: Not on file  . Transportation needs - medical: Not on file  . Transportation needs - non-medical: Not on file  Occupational History  . Occupation: Retired     Comment: from Orth[employer]   Tobacco Use  . Smoking status: Never Smoker  . Smokeless tobacco: Never Used  . Tobacco comment: tried for a few months in college.   Substance and Sexual Activity  . Alcohol use: No    Alcohol/week: 0.0 oz  . Drug use: No  . Sexual activity: No    Birth control/protection: Post-menopausal  Other Topics Concern  . Not on file  Social History Narrative   Daughter lives w/ her and another daughter lives in town   1 son in [place]   Son comes home every 4 weeks to visit   Caffeine use:  Tea 1/day w/ dinner   Coffee occass             Review of systems: Review of Systems  Constitutional: Negative for fever and chills.  HENT: Positive for hearing problems  Eyes: Negative for blurred vision.  Respiratory: Negative for cough, shortness of breath and wheezing.   Cardiovascular: Negative for chest pain and palpitations.  Gastrointestinal: as per HPI Genitourinary: Negative for dysuria, urgency, frequency and hematuria.  Musculoskeletal: Positive for myalgias, back pain and joint pain.  Skin: Negative for itching  and rash.  Neurological: Negative for dizziness, tremors, focal weakness, seizures and loss of consciousness. Positive headaches Endo/Heme/Allergies: Positive for seasonal allergies.  Psychiatric/Behavioral: Negative for depression, suicidal ideas and hallucinations.  All other systems reviewed and are negative.   Physical Exam: Vitals:   09/20/17 1421  BP: 124/68  Pulse: 78   Body mass index is 31 kg/m. Gen:      No acute distress, wheel chair HEENT:  EOMI, sclera anicteric Neck:     No masses; no thyromegaly Abd:      + bowel sounds; soft,  non-tender; no palpable masses, no distension Ext:    R prosthetic limb Skin:      Warm and dry; no rash Neuro: alert and oriented x 3 Psych: normal mood and affect  Data Reviewed:  Reviewed labs, radiology imaging, old records and pertinent past GI work up  Colonoscopy 12/29/2015 with removal of 3 small polyps (tubular adenomas), internal hemorrhoids and left-sided diverticulosis  CT abdomen and pelvis May 21, 2015 Lower chest: No pleural or pericardial effusion. The lung bases appear clear. Hepatobiliary: Hepatic steatosis. The liver appears heterogeneous and slightly nodular contour suggestive of cirrhosis. 9 mm low attenuation structure within the lateral segment of left lobe is unchanged from previous exam and likely represents a benign abnormality. Previous cholecystectomy. No biliary dilatation. Pancreas: Negative. Spleen: Negative Adrenals/Urinary Tract: The adrenal glands are negative. Normal appearance of the right kidney. Mild cortical scarring involves the upper pole the left kidney. Low density structure within the upper pole the left kidney is too small to characterize measuring 8 mm. The urinary bladder is normal. Stomach/Bowel: The stomach is within normal limits. The small bowel loops have a normal course and caliber. No obstruction. Postoperative changes from a Nissen fundoplication noted. The stomach is otherwise unremarkable. The small bowel loops have a normal course and caliber. No obstruction. Status post right hemicolectomy with enterocolonic anastomosis. Vascular/Lymphatic: Calcified atherosclerotic disease involves the abdominal aorta. No aneurysm. Filter is identified within the inferior vena cava. No upper abdominal or pelvic adenopathy. Reproductive: There is no ascites or focal fluid collections within the abdomen or pelvis. Other: There is no ascites or focal fluid collections within the abdomen or pelvis. Musculoskeletal: No aggressive lytic or  sclerotic bone lesion. Degenerative disc disease noted within the lumbar spine. L1 compression fracture is unchanged from previous exam.  Assessment and Plan/Recommendations:  82 year old female with history of CAD s/p CABG, diabetes, peripheral vascular disease, CHF, status post right lower extremity amputation with prosthetic limb Irritable bowel syndrome with alternating constipation and diarrhea Advised patient to increase dietary fiber and fluid intake Start Benefiber 1 tablespoon twice daily with meals Probiotic, align 1 capsule daily for 4-6 weeks  Right lower quadrant abdominal pain: Patient has significant arthritis of right hip, could be related to that.  She is bearing more weight on her right leg.  Advised her to discuss with her physical therapist.  Continue to monitor If continues to have persistent worsening abdominal pain will consider further workup  25 minutes was spent face-to-face with the patient. Greater than 50% of the time used for counseling as well as treatment plan and follow-up. She had multiple questions which were answered to her satisfaction  K. Denzil Magnuson , MD [phone] Mon-Fri 8a-5p [phone] after 5p, weekends, holidays  CC: Colon Branch, MD

## 2017-09-20 NOTE — Progress Notes (Signed)
Subjective:    Patient ID: [name], female    DOB: 1935/05/29, 82 y.o.   MRN: [id number]  HPI 82 year old never smoker with a history of pulmonary embolism (with an IVC filter), CAD s/p CABG 09/2016, allergic rhinitis, GERD/IBS.  History of a positive PPD with a known TB exposure in the 1980s, never treated for latent TB.  Chest x-rays were always reassuring.  She has been followed in our office in the past by Dr. Gwenette Greet for a pulmonary nodule in the right middle lobe first noted on CT 08/27/09.  This was stable on a CT scan from 01/08/14 at 1.2 x 0.8 cm.  CT scan report from 10/04/16 is available which measured the nodule at 1.3 x 0.8 cm      Review of Systems  Constitutional: Negative for fever and unexpected weight change.  HENT: Negative for congestion, dental problem, ear pain, nosebleeds, postnasal drip, rhinorrhea, sinus pressure, sneezing, sore throat and trouble swallowing.   Eyes: Negative for redness and itching.  Respiratory: Negative for cough, chest tightness, shortness of breath and wheezing.   Cardiovascular: Negative for palpitations and leg swelling.  Gastrointestinal: Negative for nausea and vomiting.  Genitourinary: Negative for dysuria.  Musculoskeletal: Negative for joint swelling.  Skin: Negative for rash.  Neurological: Negative for headaches.  Hematological: Does not bruise/bleed easily.  Psychiatric/Behavioral: Negative for dysphoric mood. The patient is not nervous/anxious.    Past Medical History:  Diagnosis Date  . Abnormal echocardiogram    09/2013: mild LVH, mild focal basal hypertrophy of septum, EF 60-65%, normal WM, grade 1 diastolic dysfunction, MAC, mild LAE, ASA, PASP 34. Possible oscillating MV density - reviewed by MD and felt there was no significant abnormality other than MAC noted with mitral valve and did not require TEE.  . Adenomatous colon polyp   . Allergic rhinitis   . Arthritis    "back; ankles; hands; knees" (10/31/2013)  .  Bilateral sensorineural hearing loss   . Bladder infection    frequently  . CAD (coronary artery disease)    a. Nonobst in 2011. b. Abnormal nuc 09/2013 -> s/p cutting balloon to D2, mild LAD disease; c. 08/2015 MV: no ischemia, EF 71%.  . Cancer ([place])    cancerous polyps  . Carcinoma in situ in a polyp 1994   a. 1994 - malignant polyp removed during colonoscopy.  . Charcot's Foot    a.  01/2016 s/p RLE transtibial amputation 2/2 Charcot rocker-bottom deformity and insensate neuropathy ulceration.  . Chronic diastolic CHF (congestive heart failure) ([place])    a. 09/2013 Echo: EF 60-65%, mild LVH, Gr1 DD.  Marland Kitchen Diverticulosis   . DJD (degenerative joint disease)   . DVT (deep venous thrombosis) ([place]) 12/2007  . Dyspnea    a. Chronic, extensive w/u see OV note 09-2010. b. [place] 2011: ormal with RA 5 RV 31/2 PA 27/12 (19) PCW 10 CO normal. No evidence of shunting with sitting up in cath lab. c. CPX 2011: see report. d. Prior fluoro of diaphragm -  R diaphragm elevated at rest but both moved with inspiration.   . Fatty liver   . GERD (gastroesophageal reflux disease)    a. Hx GERD/esophageal dysmotility followed by Dr. Olevia Perches.   Marland Kitchen Head injury due to trauma   . Headache    Optic migraine  . Heart attack ([place])    2017  . Hemorrhoids   . Hiatal hernia    a. s/p Nissen fundoplication 5573.  Marland Kitchen Hyperlipidemia  a. patient unwilling to use statins.  . Hypertensive heart disease   . IBS (irritable bowel syndrome)   . Macular degeneration 03/2009   Dr. Rosana Hoes  . Macular degeneration   . Myocardial infarction ([place])   . Neuropathy    a. Hands, feet, legs.  . Orthostatic hypotension   . Osteomyelitis ([place])    a. 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 01/2016 s/p RLE transtibial amputation 2/2 Charcot rocker-bottom deformity and insensate neuropathy ulceration.  . Osteoporosis   . PE (pulmonary embolism) 12/2007   a. PE/DVT after neck surgery  2009. b. coumadin d/c 10-2008.  Marland Kitchen Pneumonia 2015ish  . PONV (postoperative nausea and vomiting) 2009   neck surgery  . PPD positive   . Pulmonary nodule    incidental per CT:  Pet scan 4-9: likely benign, CT 08-2009 no change, no further CTs (Dr. Gwenette Greet)  . Recurrent UTI   . RSD (reflex sympathetic dystrophy)    a. Chronic pain.  Marland Kitchen Spinal stenosis   . Type II diabetes mellitus ([facility])    no on medication  . Venous insufficiency    a. Contributing to LEE.     Family History  Problem Relation Age of Onset  . Heart disease Mother        mitral valve replaced  . Arthritis Mother   . Diabetic kidney disease Daughter   . Diabetes Father   . Stroke Father   . Migraines Sister   . Hypertension Unknown   . Breast cancer Unknown   . Headache Sister   . Colon cancer Neg Hx   . Esophageal cancer Neg Hx   . Stomach cancer Neg Hx   . Rectal cancer Neg Hx      Social History   Socioeconomic History  . Marital status: Divorced    Spouse name: Not on file  . Number of children: 3  . Years of education: 60  . Highest education level: Not on file  Social Needs  . Financial resource strain: Not on file  . Food insecurity - worry: Not on file  . Food insecurity - inability: Not on file  . Transportation needs - medical: Not on file  . Transportation needs - non-medical: Not on file  Occupational History  . Occupation: Retired     Comment: from [employer]   Tobacco Use  . Smoking status: Never Smoker  . Smokeless tobacco: Never Used  . Tobacco comment: tried for a few months in college.   Substance and Sexual Activity  . Alcohol use: No    Alcohol/week: 0.0 oz  . Drug use: No  . Sexual activity: No    Birth control/protection: Post-menopausal  Other Topics Concern  . Not on file  Social History Narrative   Daughter lives w/ her and another daughter lives in town   1 son in [place]   Son comes home every 4 weeks to visit   Caffeine use:  Tea 1/day w/ dinner   Coffee occass           She used to work for social services She has a history of a positive PPD and a documented TB exposure from 1 of her clients  Allergies  Allergen Reactions  . Cymbalta [Duloxetine Hcl] Swelling    Swelling in legs  . Gabapentin Swelling    Swelling in legs Swelling in legs  . Silicone Hives, Itching, Dermatitis and Rash  . Metformin And Related  Other (See Comments)    dizzy, tired, chills, diarrhea, and nausea  . Adhesive [Tape] Rash    rash rash  . Cefazolin Hives    Hives Hives   . Ciprofloxacin Other (See Comments)    Other reaction(s): Other (See Comments) Per pt, caused body aches Per pt, caused body aches   . Clorazepate Dipotassium Other (See Comments)    Unknown reaction  . Darifenacin Hydrobromide Other (See Comments)    hypotension, near syncope  . Dilaudid [Hydromorphone Hcl] Other (See Comments)     confused, intense itching  . Doxycycline Rash  . Enablex [Darifenacin Hydrobromide Er] Other (See Comments)    Hypotension, near syncope  . Levofloxacin Other (See Comments)    Causes wrist pain  . Lyrica [Pregabalin] Swelling    Swelling in legs  . Methadone Hcl Other (See Comments)    Reaction unknown  . Morphine And Related Other (See Comments)    Confusion, constipation.   Loma Messing [Pentazocine] Other (See Comments)    "climbing walls" anxiety     Outpatient Medications Prior to Visit  Medication Sig Dispense Refill  . Alpha-Lipoic Acid 600 MG CAPS Take 600 mg by mouth 2 (two) times daily.    Marland Kitchen amitriptyline (ELAVIL) 10 MG tablet Start with 1 tablet at bedtime, increase to 2 or 3 tablets as needed for phantom pain 90 tablet 1  . aspirin 81 MG tablet Take 81 mg by mouth daily.    Marland Kitchen atorvastatin (LIPITOR) 40 MG tablet Take 40 mg by mouth at bedtime.     . carvedilol (COREG) 25 MG tablet Take 25 mg by mouth 2 (two) times daily with a meal.    . Cholecalciferol (VITAMIN D3) 5000 UNITS CAPS Take 5,000 Units by mouth every evening. Reported on  11/24/2015    . clopidogrel (PLAVIX) 75 MG tablet Take 75 mg by mouth at bedtime.     . Cyanocobalamin (VITAMIN B-12) 500 MCG SUBL Place 500 mcg under the tongue daily.    . diclofenac sodium (VOLTAREN) 1 % GEL APPLY 2-4 GRAMS TO AFFECTED AREA 3 TIMES DAILY AS NEEDED 500 g 5  . furosemide (LASIX) 20 MG tablet Take 20 mg by mouth daily.    Marland Kitchen glucose blood (ACCU-CHEK GUIDE) test strip Check blood sugar no more than twice daily 100 each 12  . HYDROcodone-acetaminophen (NORCO) 5-325 MG tablet Take 1 tablet by mouth every 6 (six) hours as needed. 120 tablet 0  . hydrocortisone (ANUSOL-HC) 25 MG suppository Place 1 suppository (25 mg total) rectally every 12 (twelve) hours. 12 suppository 2  . ibuprofen (ADVIL,MOTRIN) 200 MG tablet Take 200-400 mg by mouth every 8 (eight) hours as needed (for pain.).    Marland Kitchen Lancets (ACCU-CHEK SOFT TOUCH) lancets Check blood sugar no more than twice daily 100 each 12  . lidocaine (LIDODERM) 5 % Place 1 patch onto the skin daily. Remove & Discard patch within 12 hours or as directed by MD 30 patch 3  . meclizine (ANTIVERT) 25 MG tablet Take 1 tablet (25 mg total) by mouth 2 (two) times daily as needed for dizziness. 180 tablet 1  . Multiple Vitamins-Minerals (PRESERVISION AREDS 2) CAPS Take 1 capsule by mouth 2 (two) times daily.    . nabumetone (RELAFEN) 750 MG tablet TAKE 1 TABLET BY MOUTH TWICE A DAY IF NEEDED FOR PAIN WITH FOOD 60 tablet 0  . nitroGLYCERIN (NITRODUR - DOSED IN MG/24 HR) 0.2 mg/hr patch Place 1 patch (0.2 mg total) onto the skin  daily. 30 patch 12  . nitroGLYCERIN (NITROSTAT) 0.4 MG SL tablet Place 0.4 mg under the tongue every 5 (five) minutes as needed for chest pain (x 3 doses). Reported on 04/14/2016    . omeprazole (PRILOSEC) 20 MG capsule TAKE 1 CAPSULE BY MOUTH 2 TIMES DAILY 180 capsule 3  . ondansetron (ZOFRAN-ODT) 4 MG disintegrating tablet TAKE 1 TABLET BY MOUTH EVERY 8 HOURS AS NEEDED FOR NAUSEA OR VOMITING 60 tablet 6  . OVER THE COUNTER  MEDICATION Take 1 capsule by mouth 2 (two) times daily. Integrative Digestive Formula    . OVER THE COUNTER MEDICATION 1 tablet 3 (three) times daily. Berberorine Gluco Defense     . Polyvinyl Alcohol-Povidone (REFRESH OP) Place 1 drop into both eyes 3 (three) times daily as needed (dry eyes).      No facility-administered medications prior to visit.         Objective:   Physical Exam  Vitals:   09/20/17 1546 09/20/17 1548  BP:  112/62  Pulse:  82  SpO2:  98%  Height: _0  (1.626 m)    Gen: Pleasant, elderly woman, well-nourished, in no distress,  normal affect  ENT: No lesions,  mouth clear,  oropharynx clear, no postnasal drip  Neck: No JVD, no stridor  Lungs: No use of accessory muscles, clear without rales or rhonchi  Cardiovascular: RRR, heart sounds normal, no murmur or gallops, no peripheral edema  Musculoskeletal: No deformities, no cyanosis or clubbing  Neuro: alert, non focal  Skin: Warm, no lesions or rashes    Assessment & Plan:  Pulmonary nodule, right 1.2 x 0.8 right middle lobe nodule that have been stable between 2010 and 2015.  A follow-up scan in [place] estimated it at 1.3 x 0.8 cm.  I will obtain copies of that scan.  We will plan to repeat her CT scan in December (the one year mark) to compare with priors.  I suspect that this is a benign nodule.  She does have a history of a positive PPD without any evidence of active TB, never treated with isoniazid.  If her follow-up CT is stable then we may be able to just follow chest x-rays in the future.  Baltazar Apo, MD, PhD 09/20/2017, 4:21 PM [facility] [phone] or if no answer [phone]

## 2017-09-20 NOTE — Patient Instructions (Signed)
Use Benefiber 1 tablespoon twice daily  Use Probiotic daily for 1 month, Align samples given

## 2017-09-20 NOTE — Assessment & Plan Note (Signed)
1.2 x 0.8 right middle lobe nodule that have been stable between 2010 and 2015.  A follow-up scan in [place] estimated it at 1.3 x 0.8 cm.  I will obtain copies of that scan.  We will plan to repeat her CT scan in December (the one year mark) to compare with priors.  I suspect that this is a benign nodule.  She does have a history of a positive PPD without any evidence of active TB, never treated with isoniazid.  If her follow-up CT is stable then we may be able to just follow chest x-rays in the future.

## 2017-09-20 NOTE — Patient Instructions (Addendum)
We will repeat a CT scan of the chest without contrast in December to compare with your priors and to follow 1.3 cm nodule Obtain copies of your CT scan from [facility] from December 2017 Follow with Dr Lamonte Sakai in December after your CT scan to review the results together.

## 2017-09-21 ENCOUNTER — Telehealth: Payer: Self-pay | Admitting: Emergency Medicine

## 2017-09-21 NOTE — Telephone Encounter (Signed)
Forwarding to [facility] since the CT Chest was already ordered  Thanks

## 2017-09-24 ENCOUNTER — Inpatient Hospital Stay: Admission: RE | Admit: 2017-09-24 | Payer: Medicare Other | Source: Ambulatory Visit

## 2017-09-24 DIAGNOSIS — M1712 Unilateral primary osteoarthritis, left knee: Secondary | ICD-10-CM | POA: Diagnosis not present

## 2017-09-24 DIAGNOSIS — M19021 Primary osteoarthritis, right elbow: Secondary | ICD-10-CM | POA: Diagnosis not present

## 2017-09-24 DIAGNOSIS — M19012 Primary osteoarthritis, left shoulder: Secondary | ICD-10-CM | POA: Diagnosis not present

## 2017-09-24 NOTE — Telephone Encounter (Signed)
lmam for pt to call me back

## 2017-09-25 ENCOUNTER — Emergency Department (HOSPITAL_COMMUNITY): Payer: Medicare Other

## 2017-09-25 ENCOUNTER — Encounter (HOSPITAL_COMMUNITY): Payer: Self-pay | Admitting: Emergency Medicine

## 2017-09-25 ENCOUNTER — Observation Stay (HOSPITAL_COMMUNITY)
Admission: EM | Admit: 2017-09-25 | Discharge: 2017-09-26 | Disposition: A | Discharge status: Home / Self Care | Payer: Medicare Other | Attending: Internal Medicine | Admitting: Internal Medicine

## 2017-09-25 DIAGNOSIS — I10 Essential (primary) hypertension: Secondary | ICD-10-CM | POA: Diagnosis not present

## 2017-09-25 DIAGNOSIS — R0789 Other chest pain: Secondary | ICD-10-CM | POA: Diagnosis not present

## 2017-09-25 DIAGNOSIS — K589 Irritable bowel syndrome without diarrhea: Secondary | ICD-10-CM | POA: Insufficient documentation

## 2017-09-25 DIAGNOSIS — I5032 Chronic diastolic (congestive) heart failure: Secondary | ICD-10-CM | POA: Diagnosis not present

## 2017-09-25 DIAGNOSIS — Z86718 Personal history of other venous thrombosis and embolism: Secondary | ICD-10-CM | POA: Diagnosis not present

## 2017-09-25 DIAGNOSIS — E119 Type 2 diabetes mellitus without complications: Secondary | ICD-10-CM | POA: Insufficient documentation

## 2017-09-25 DIAGNOSIS — Z7982 Long term (current) use of aspirin: Secondary | ICD-10-CM | POA: Insufficient documentation

## 2017-09-25 DIAGNOSIS — G629 Polyneuropathy, unspecified: Secondary | ICD-10-CM

## 2017-09-25 DIAGNOSIS — K219 Gastro-esophageal reflux disease without esophagitis: Secondary | ICD-10-CM | POA: Insufficient documentation

## 2017-09-25 DIAGNOSIS — I872 Venous insufficiency (chronic) (peripheral): Secondary | ICD-10-CM | POA: Insufficient documentation

## 2017-09-25 DIAGNOSIS — Z79899 Other long term (current) drug therapy: Secondary | ICD-10-CM | POA: Diagnosis not present

## 2017-09-25 DIAGNOSIS — Z86711 Personal history of pulmonary embolism: Secondary | ICD-10-CM | POA: Insufficient documentation

## 2017-09-25 DIAGNOSIS — I252 Old myocardial infarction: Secondary | ICD-10-CM | POA: Diagnosis not present

## 2017-09-25 DIAGNOSIS — E782 Mixed hyperlipidemia: Secondary | ICD-10-CM | POA: Diagnosis not present

## 2017-09-25 DIAGNOSIS — Z89511 Acquired absence of right leg below knee: Secondary | ICD-10-CM

## 2017-09-25 DIAGNOSIS — Z87440 Personal history of urinary (tract) infections: Secondary | ICD-10-CM | POA: Insufficient documentation

## 2017-09-25 DIAGNOSIS — G894 Chronic pain syndrome: Secondary | ICD-10-CM | POA: Diagnosis present

## 2017-09-25 DIAGNOSIS — Z86010 Personal history of colonic polyps: Secondary | ICD-10-CM | POA: Diagnosis not present

## 2017-09-25 DIAGNOSIS — R079 Chest pain, unspecified: Principal | ICD-10-CM | POA: Insufficient documentation

## 2017-09-25 DIAGNOSIS — M1460 Charcot's joint, unspecified site: Secondary | ICD-10-CM | POA: Diagnosis present

## 2017-09-25 DIAGNOSIS — Z951 Presence of aortocoronary bypass graft: Secondary | ICD-10-CM

## 2017-09-25 DIAGNOSIS — I130 Hypertensive heart and chronic kidney disease with heart failure and stage 1 through stage 4 chronic kidney disease, or unspecified chronic kidney disease: Secondary | ICD-10-CM | POA: Diagnosis not present

## 2017-09-25 DIAGNOSIS — M1990 Unspecified osteoarthritis, unspecified site: Secondary | ICD-10-CM | POA: Insufficient documentation

## 2017-09-25 DIAGNOSIS — H3530 Unspecified macular degeneration: Secondary | ICD-10-CM | POA: Diagnosis not present

## 2017-09-25 DIAGNOSIS — E785 Hyperlipidemia, unspecified: Secondary | ICD-10-CM | POA: Diagnosis present

## 2017-09-25 DIAGNOSIS — E1142 Type 2 diabetes mellitus with diabetic polyneuropathy: Secondary | ICD-10-CM

## 2017-09-25 DIAGNOSIS — I25119 Atherosclerotic heart disease of native coronary artery with unspecified angina pectoris: Secondary | ICD-10-CM | POA: Diagnosis not present

## 2017-09-25 LAB — CBC WITH DIFFERENTIAL/PLATELET
Basophils Absolute: 0 10*3/uL (ref 0.0–0.1)
Basophils Relative: 0 %
Eosinophils Absolute: 0.1 10*3/uL (ref 0.0–0.7)
Eosinophils Relative: 2 %
HCT: 35.5 % — ABNORMAL LOW (ref 36.0–46.0)
Hemoglobin: 11.3 g/dL — ABNORMAL LOW (ref 12.0–15.0)
Lymphocytes Relative: 24 %
Lymphs Abs: 1.3 10*3/uL (ref 0.7–4.0)
MCH: 28 pg (ref 26.0–34.0)
MCHC: 31.8 g/dL (ref 30.0–36.0)
MCV: 88.1 fL (ref 78.0–100.0)
Monocytes Absolute: 0.5 10*3/uL (ref 0.1–1.0)
Monocytes Relative: 9 %
Neutro Abs: 3.5 10*3/uL (ref 1.7–7.7)
Neutrophils Relative %: 65 %
Platelets: 179 10*3/uL (ref 150–400)
RBC: 4.03 MIL/uL (ref 3.87–5.11)
RDW: 14 % (ref 11.5–15.5)
WBC: 5.4 10*3/uL (ref 4.0–10.5)

## 2017-09-25 LAB — COMPREHENSIVE METABOLIC PANEL
ALT: 8 U/L — ABNORMAL LOW (ref 14–54)
AST: 18 U/L (ref 15–41)
Albumin: 3.3 g/dL — ABNORMAL LOW (ref 3.5–5.0)
Alkaline Phosphatase: 110 U/L (ref 38–126)
Anion gap: 11 (ref 5–15)
BUN: 13 mg/dL (ref 6–20)
CO2: 23 mmol/L (ref 22–32)
Calcium: 8.4 mg/dL — ABNORMAL LOW (ref 8.9–10.3)
Chloride: 100 mmol/L — ABNORMAL LOW (ref 101–111)
Creatinine, Ser: 1.17 mg/dL — ABNORMAL HIGH (ref 0.44–1.00)
GFR calc Af Amer: 50 mL/min — ABNORMAL LOW (ref >60)
GFR calc non Af Amer: 43 mL/min — ABNORMAL LOW (ref >60)
Glucose, Bld: 114 mg/dL — ABNORMAL HIGH (ref 65–99)
Potassium: 4.5 mmol/L (ref 3.5–5.1)
Sodium: 134 mmol/L — ABNORMAL LOW (ref 135–145)
Total Bilirubin: 0.6 mg/dL (ref 0.3–1.2)
Total Protein: 6.1 g/dL — ABNORMAL LOW (ref 6.5–8.1)

## 2017-09-25 LAB — TROPONIN I: Troponin I: <0.03 ng/mL (ref <0.03)

## 2017-09-25 MED ORDER — SODIUM CHLORIDE 0.9 % IV SOLN
INTRAVENOUS | Status: DC
  Administered 2017-09-26: via INTRAVENOUS

## 2017-09-25 MED ORDER — PROSIGHT PO TABS
1.0000 | ORAL_TABLET | Freq: Two times a day (BID) | ORAL | Status: DC
  Administered 2017-09-25 – 2017-09-26 (×2): 1 via ORAL
  Filled 2017-09-25 (×2): qty 1

## 2017-09-25 MED ORDER — SODIUM CHLORIDE 0.9 % IV BOLUS (SEPSIS)
1000.0000 mL | Freq: Once | INTRAVENOUS | Status: AC
  Administered 2017-09-25: 1000 mL via INTRAVENOUS

## 2017-09-25 MED ORDER — NITROGLYCERIN 0.4 MG SL SUBL: 0.4000 mg | SUBLINGUAL_TABLET | SUBLINGUAL | Status: DC | PRN

## 2017-09-25 MED ORDER — ONDANSETRON 4 MG PO TBDP: 4.0000 mg | ORAL_TABLET | Freq: Three times a day (TID) | ORAL | Status: DC | PRN

## 2017-09-25 MED ORDER — VITAMIN B-12 1000 MCG PO TABS
500.0000 ug | ORAL_TABLET | Freq: Every day | ORAL | Status: DC
Start: 2017-09-26 — End: 2017-09-26
  Administered 2017-09-26: 500 ug via ORAL
  Filled 2017-09-25: qty 1

## 2017-09-25 MED ORDER — VITAMIN D 1000 UNITS PO TABS
5000.0000 [IU] | ORAL_TABLET | Freq: Every evening | ORAL | Status: DC
  Filled 2017-09-25: qty 5

## 2017-09-25 MED ORDER — HYDROCODONE-ACETAMINOPHEN 5-325 MG PO TABS
1.0000 | ORAL_TABLET | Freq: Four times a day (QID) | ORAL | Status: DC | PRN
  Administered 2017-09-26: 1 via ORAL
  Filled 2017-09-25: qty 1

## 2017-09-25 MED ORDER — NABUMETONE 500 MG PO TABS
750.0000 mg | ORAL_TABLET | Freq: Two times a day (BID) | ORAL | Status: DC | PRN
  Administered 2017-09-26: 750 mg via ORAL
  Filled 2017-09-25: qty 2

## 2017-09-25 MED ORDER — ACETAMINOPHEN 325 MG PO TABS: 650.0000 mg | ORAL_TABLET | ORAL | Status: DC | PRN

## 2017-09-25 MED ORDER — AMITRIPTYLINE HCL 10 MG PO TABS
20.0000 mg | ORAL_TABLET | Freq: Every day | ORAL | Status: DC
  Administered 2017-09-25: 20 mg via ORAL
  Filled 2017-09-25 (×2): qty 2

## 2017-09-25 MED ORDER — CARVEDILOL 25 MG PO TABS
25.0000 mg | ORAL_TABLET | Freq: Two times a day (BID) | ORAL | Status: DC
  Administered 2017-09-26: 25 mg via ORAL
  Filled 2017-09-25 (×2): qty 1

## 2017-09-25 MED ORDER — PANTOPRAZOLE SODIUM 40 MG PO TBEC
80.0000 mg | DELAYED_RELEASE_TABLET | Freq: Every day | ORAL | Status: DC
  Administered 2017-09-26: 80 mg via ORAL
  Filled 2017-09-25: qty 2

## 2017-09-25 MED ORDER — ONDANSETRON HCL 4 MG/2ML IJ SOLN: 4.0000 mg | Freq: Four times a day (QID) | INTRAMUSCULAR | Status: DC | PRN

## 2017-09-25 MED ORDER — ASPIRIN EC 81 MG PO TBEC
81.0000 mg | DELAYED_RELEASE_TABLET | Freq: Every day | ORAL | Status: DC
  Administered 2017-09-26: 81 mg via ORAL
  Filled 2017-09-25: qty 1

## 2017-09-25 MED ORDER — ENOXAPARIN SODIUM 40 MG/0.4ML ~~LOC~~ SOLN
40.0000 mg | SUBCUTANEOUS | Status: DC
  Administered 2017-09-26: 40 mg via SUBCUTANEOUS
  Filled 2017-09-25: qty 0.4

## 2017-09-25 MED ORDER — CLOPIDOGREL BISULFATE 75 MG PO TABS
75.0000 mg | ORAL_TABLET | Freq: Every day | ORAL | Status: DC
  Administered 2017-09-25: 75 mg via ORAL
  Filled 2017-09-25: qty 1

## 2017-09-25 MED ORDER — DICLOFENAC SODIUM 1 % TD GEL
2.0000 g | Freq: Four times a day (QID) | TRANSDERMAL | Status: DC
  Administered 2017-09-26 (×3): 2 g via TOPICAL
  Filled 2017-09-25: qty 100

## 2017-09-25 MED ORDER — MORPHINE SULFATE (PF) 4 MG/ML IV SOLN: 2.0000 mg | INTRAVENOUS | Status: DC | PRN

## 2017-09-25 MED ORDER — LIDOCAINE 5 % EX PTCH
1.0000 | MEDICATED_PATCH | CUTANEOUS | Status: DC
  Filled 2017-09-25: qty 1

## 2017-09-25 MED ORDER — GI COCKTAIL ~~LOC~~: 30.0000 mL | Freq: Four times a day (QID) | ORAL | Status: DC | PRN

## 2017-09-25 NOTE — H&P (Signed)
[facility] History and Physical  TUESDAY TERLECKI EKC:[id number] DOB: 1935-04-25 DOA: 09/25/2017  Referring physician: Dr Regenia Skeeter PCP: Colon Branch, MD   Chief Complaint: Chest pain  HPI: [name] is a 82 y.o. female with hx of with hx of DVT/ PE, Charcot foot problems / neuropathy LE's sp R BKA , sp CABG Dec 2017 (in [facility]), hx dysplastic polyps sp partial colectomy, came to ED today c/o chest pain.  Says she was having L shoulder pain for 2-3 days and saw ortho MD yest. She had some epig sharp pain after awakening this am but that improved and around 2 pm she stood up and felt like "all my energy was draining out of me". She had assoc CP and SOB and the CP has persisted.  No recent N/V, no diarrhea.  CP felt different than her MI last year, maybe gas / burping sensation.  In ED pain was 5/10, she got asa and 1 NTG which dropped her BP but improved her CP.  She is now improved w/ no CP.  We are asked to admit for CP , r/o.     Chart: 2003 - rectal Ca, IBS, RSD, biliary dyskinesia 2009 - bilat PE, LLE DVT, DM2, HTN, hx malig rectal polyp 2009 - atypical CP > echo wnl, CE's neg 2013 - osteo abscess L foot 5th metatarsal > L 5th ray amp, SNFP 2013 - Charcot collapse L ankle > had T-C fusion 2014 - Charcot collapse w osteo,  R foot w surgery per ortho 2015 - CAD/ acc angina > had cutting balloon to D2, mild LAD disease by cath ; HTN, DM2 2015 - abd pain post polypectomy 2016 - partial colectomy - due to dysplasia in colon polyps 2017 - Charcot R foot collapse w/ osteo and R foot ulcer > underwent R BKA   ROS  denies CP  no joint pain   no HA  no blurry vision  no rash  no diarrhea  no nausea/ vomiting  no dysuria  no difficulty voiding  no change in urine color    Past Medical History  Past Medical History:  Diagnosis Date  . Abnormal echocardiogram    09/2013: mild LVH, mild focal basal hypertrophy of septum, EF 60-65%, normal WM, grade 1 diastolic dysfunction, MAC, mild  LAE, ASA, PASP 34. Possible oscillating MV density - reviewed by MD and felt there was no significant abnormality other than MAC noted with mitral valve and did not require TEE.  . Adenomatous colon polyp   . Allergic rhinitis   . Arthritis    "back; ankles; hands; knees" (10/31/2013)  . Bilateral sensorineural hearing loss   . Bladder infection    frequently  . CAD (coronary artery disease)    a. Nonobst in 2011. b. Abnormal nuc 09/2013 -> s/p cutting balloon to D2, mild LAD disease; c. 08/2015 MV: no ischemia, EF 71%.  . Cancer ([place])    cancerous polyps  . Carcinoma in situ in a polyp 1994   a. 1994 - malignant polyp removed during colonoscopy.  . Charcot's Foot    a.  01/2016 s/p RLE transtibial amputation 2/2 Charcot rocker-bottom deformity and insensate neuropathy ulceration.  . Chronic diastolic CHF (congestive heart failure) ([place])    a. 09/2013 Echo: EF 60-65%, mild LVH, Gr1 DD.  Marland Kitchen Diverticulosis   . DJD (degenerative joint disease)   . DVT (deep venous thrombosis) ([place]) 12/2007  . Dyspnea    a. Chronic, extensive w/u see OV  note 09-2010. b. [place] 2011: ormal with RA 5 RV 31/2 PA 27/12 (19) PCW 10 CO normal. No evidence of shunting with sitting up in cath lab. c. CPX 2011: see report. d. Prior fluoro of diaphragm -  R diaphragm elevated at rest but both moved with inspiration.   . Fatty liver   . GERD (gastroesophageal reflux disease)    a. Hx GERD/esophageal dysmotility followed by Dr. Olevia Perches.   Marland Kitchen Head injury due to trauma   . Headache    Optic migraine  . Heart attack ([place])    2017  . Hemorrhoids   . Hiatal hernia    a. s/p Nissen fundoplication 1610.  Marland Kitchen Hyperlipidemia    a. patient unwilling to use statins.  . Hypertensive heart disease   . IBS (irritable bowel syndrome)   . Macular degeneration 03/2009   Dr. Rosana Hoes  . Macular degeneration   . Myocardial infarction ([place])   . Neuropathy    a. Hands, feet, legs.  . Orthostatic hypotension   . Osteomyelitis ([place])    a.  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 01/2016 s/p RLE transtibial amputation 2/2 Charcot rocker-bottom deformity and insensate neuropathy ulceration.  . Osteoporosis   . PE (pulmonary embolism) 12/2007   a. PE/DVT after neck surgery 2009. b. coumadin d/c 10-2008.  Marland Kitchen Pneumonia 2015ish  . PONV (postoperative nausea and vomiting) 2009   neck surgery  . PPD positive   . Pulmonary nodule    incidental per CT:  Pet scan 4-9: likely benign, CT 08-2009 no change, no further CTs (Dr. Gwenette Greet)  . Recurrent UTI   . RSD (reflex sympathetic dystrophy)    a. Chronic pain.  Marland Kitchen Spinal stenosis   . Type II diabetes mellitus (HCC)    no on medication  . Venous insufficiency    a. Contributing to LEE.   Past Surgical History  Past Surgical History:  Procedure Laterality Date  . AMPUTATION  03/06/2012   Procedure: AMPUTATION FOOT;  Surgeon: Newt Minion, MD;  Location: [place];  Service: Orthopedics;  Laterality: Left;  FIFTH RAY AMPUTATION   . AMPUTATION Right 02/18/2016   Procedure: AMPUTATION BELOW KNEE;  Surgeon: Newt Minion, MD;  Location: [place];  Service: Orthopedics;  Laterality: Right;  . AMPUTATION Left 11/22/2016   Procedure: Amputation 4th Toe Left Foot at Metatarsophalangeal Joint;  Surgeon: Newt Minion, MD;  Location: [place];  Service: Orthopedics;  Laterality: Left;  . ANKLE FUSION  09/27/2012   Procedure: ANKLE FUSION;  Surgeon: Newt Minion, MD;  Location: [place];  Service: Orthopedics;  Laterality: Left;  Left Tibiocalcaneal Fusion  . ANKLE FUSION Right 05/09/2013   Procedure: ANKLE FUSION;  Surgeon: Newt Minion, MD;  Location: [place];  Service: Orthopedics;  Laterality: Right;  Excision Osteomyelitis Base 1st MT Right Foot, Fusion Medial Column  . ANKLE SURGERY Left apr & june 1991  . ANTERIOR CERVICAL DECOMP/DISCECTOMY FUSION  12/18/07   For OA,  Dr. Lorin Mercy:  fu by a PE  . CARDIAC CATHETERIZATION  05/2010    at [facility]  .  CARPAL TUNNEL RELEASE Left 09/2011  . CATARACT EXTRACTION W/ INTRAOCULAR LENS  IMPLANT, BILATERAL  2004   feb 2004 left, aug 2004 right  . CHOLECYSTECTOMY  03/2002  . COLONOSCOPY     numerous times  . CORONARY ANGIOPLASTY  10/31/2013  . CORONARY ARTERY BYPASS GRAFT  09/27/2016  . CORONARY ARTERY BYPASS  GRAFT  09/2016   in [place].  . CYST REMOVAL HAND  06/2003  . FOOT BONE EXCISION Right 06/2009  . LAPAROSCOPIC RIGHT HEMI COLECTOMY N/A 01/08/2015   Procedure: LAPAROSCOPIC ASSISTED RIGHT HEMI COLECTOMY;  Surgeon: Johnathan Hausen, MD;  Location: [facility];  Service: General;  Laterality: N/A;  . LEFT HEART CATHETERIZATION WITH CORONARY ANGIOGRAM N/A 10/31/2013   Procedure: LEFT HEART CATHETERIZATION WITH CORONARY ANGIOGRAM;  Surgeon: Jettie Booze, MD;  Location: [facility] C[facility] LAB;  Service: Cardiovascular;  Laterality: N/A;  . NASAL SEPTUM SURGERY  09/1963  . NISSEN FUNDOPLICATION  12/27/6438  . PERCUTANEOUS CORONARY INTERVENTION-BALLOON ONLY  10/31/2013   Procedure: PERCUTANEOUS CORONARY INTERVENTION-BALLOON ONLY;  Surgeon: Jettie Booze, MD;  Location: [facility] CATH LAB;  Service: Cardiovascular;;  . ROTATOR CUFF REPAIR Right 07/2007   Dr. Percell Miller  . ROTATOR CUFF REPAIR Left 11/2004  . stent in left leg, behind knee  06/27/2017   done at [facility]  . TOE AMPUTATION Right 08/2008   3rd toe, Dr. Sharol Given due to osteomyelitis  . VAGINAL HYSTERECTOMY  03/1975  . VEIN LIGATION Bilateral 03/1966  . VENA CAVA FILTER PLACEMENT  01/2010   green filter; "due to blood clots"  . WISDOM TOOTH EXTRACTION  06/2007   Family History  Family History  Problem Relation Age of Onset  . Heart disease Mother        mitral valve replaced  . Arthritis Mother   . Diabetic kidney disease Daughter   . Diabetes Father   . Stroke Father   . Migraines Sister   . Hypertension Unknown   . Breast cancer Unknown   . Headache Sister   . Colon cancer Neg Hx   . Esophageal cancer Neg Hx   . Stomach cancer Neg  Hx   . Rectal cancer Neg Hx    Social History  reports that  has never smoked. she has never used smokeless tobacco. She reports that she does not drink alcohol or use drugs. Allergies  Allergies  Allergen Reactions  . Cymbalta [Duloxetine Hcl] Swelling    Swelling in legs  . Gabapentin Swelling    Swelling in legs Swelling in legs  . Silicone Hives, Itching, Dermatitis and Rash  . Metformin And Related Other (See Comments)    dizzy, tired, chills, diarrhea, and nausea  . Adhesive [Tape] Rash    rash rash  . Cefazolin Hives    Hives Hives   . Ciprofloxacin Other (See Comments)    Other reaction(s): Other (See Comments) Per pt, caused body aches Per pt, caused body aches   . Clorazepate Dipotassium Other (See Comments)    Unknown reaction  . Darifenacin Hydrobromide Other (See Comments)    hypotension, near syncope  . Dilaudid [Hydromorphone Hcl] Other (See Comments)     confused, intense itching  . Doxycycline Rash  . Enablex [Darifenacin Hydrobromide Er] Other (See Comments)    Hypotension, near syncope  . Levofloxacin Other (See Comments)    Causes wrist pain  . Lyrica [Pregabalin] Swelling    Swelling in legs  . Methadone Hcl Other (See Comments)    Reaction unknown  . Morphine And Related Other (See Comments)    Confusion, constipation.   [place] [Pentazocine] Other (See Comments)    "climbing walls" anxiety   Home medications Prior to Admission medications   Medication Sig Start Date End Date Taking? Authorizing Provider  Alpha-Lipoic Acid 600 MG CAPS Take 600 mg by mouth 2 (two)  times daily.   Yes [provider]  amitriptyline (ELAVIL) 10 MG tablet Start with 1 tablet at bedtime, increase to 2 or 3 tablets as needed for phantom pain Patient taking differently: Take 20 mg by mouth at bedtime.  03/30/17  Yes Colon Branch, MD  aspirin 81 MG tablet Take 81 mg by mouth daily.   Yes [provider]  carvedilol (COREG) 25 MG tablet Take 25 mg by  mouth 2 (two) times daily with a meal.   Yes [provider]  Cholecalciferol (VITAMIN D3) 5000 UNITS CAPS Take 5,000 Units by mouth every evening. Reported on 11/24/2015   Yes [provider]  clopidogrel (PLAVIX) 75 MG tablet Take 75 mg by mouth at bedtime.    Yes [provider]  Cyanocobalamin (VITAMIN B-12) 500 MCG SUBL Place 500 mcg under the tongue daily.   Yes [provider]  diclofenac sodium (VOLTAREN) 1 % GEL APPLY 2-4 GRAMS TO AFFECTED AREA 3 TIMES DAILY AS NEEDED 12/29/16  Yes Newt Minion, MD  furosemide (LASIX) 20 MG tablet Take 20 mg by mouth daily. 11/09/16  Yes [provider]  HYDROcodone-acetaminophen (NORCO) 5-325 MG tablet Take 1 tablet by mouth every 6 (six) hours as needed. 07/23/17  Yes Paz, Alda Berthold, MD  hydrocortisone (ANUSOL-HC) 25 MG suppository Place 1 suppository (25 mg total) rectally every 12 (twelve) hours. 02/01/17  Yes Nandigam, Venia Minks, MD  ibuprofen (ADVIL,MOTRIN) 200 MG tablet Take 200-400 mg by mouth every 8 (eight) hours as needed (for pain.).   Yes [provider]  lidocaine (LIDODERM) 5 % Place 1 patch onto the skin daily. Remove & Discard patch within 12 hours or as directed by MD 07/23/17  Yes Colon Branch, MD  meclizine (ANTIVERT) 25 MG tablet Take 1 tablet (25 mg total) by mouth 2 (two) times daily as needed for dizziness. 09/18/16  Yes Paz, Alda Berthold, MD  Multiple Vitamins-Minerals (PRESERVISION AREDS 2) CAPS Take 1 capsule by mouth 2 (two) times daily.   Yes [provider]  nabumetone (RELAFEN) 750 MG tablet TAKE 1 TABLET BY MOUTH TWICE A DAY IF NEEDED FOR PAIN WITH FOOD Patient taking differently: TAKE 750 mg TABLET BY MOUTH TWICE A DAY IF NEEDED FOR PAIN WITH FOOD 09/17/17  Yes Newt Minion, MD  nitroGLYCERIN (NITROSTAT) 0.4 MG SL tablet Place 0.4 mg under the tongue every 5 (five) minutes as needed for chest pain (x 3 doses). Reported on 04/14/2016   Yes [provider]  omeprazole  (PRILOSEC) 20 MG capsule TAKE 1 CAPSULE BY MOUTH 2 TIMES DAILY Patient taking differently: TAKE 20 mg CAPSULE BY MOUTH 2 TIMES DAILY 02/06/17  Yes Nandigam, Kavitha V, MD  ondansetron (ZOFRAN-ODT) 4 MG disintegrating tablet TAKE 1 TABLET BY MOUTH EVERY 8 HOURS AS NEEDED FOR NAUSEA OR VOMITING Patient taking differently: TAKE 4 mg TABLET BY MOUTH EVERY 8 HOURS AS NEEDED FOR NAUSEA OR VOMITING 07/16/17  Yes Melvenia Beam, MD  OVER THE COUNTER MEDICATION Take 1 capsule by mouth 2 (two) times daily. Integrative Digestive Formula   Yes [provider]  OVER THE COUNTER MEDICATION 1 tablet 3 (three) times daily. [place] Gluco Defense    Yes [provider]  Polyvinyl Alcohol-Povidone (REFRESH OP) Place 1 drop into both eyes 3 (three) times daily as needed (dry eyes).    Yes [provider]  atorvastatin (LIPITOR) 40 MG tablet Take 40 mg by mouth at bedtime.     [provider]  nitroGLYCERIN (NITRODUR - DOSED IN MG/24 HR) 0.2 mg/hr patch Place 1 patch (0.2 mg total) onto the skin daily. Patient not taking: Reported on 09/25/2017 05/23/17   Newt Minion, MD  predniSONE (STERAPRED UNI-PAK 21 TAB) 5 MG (21) TBPK tablet Take 5 mg by mouth daily. 21 day pack 09/24/17   [provider]   Liver Function Tests Recent Labs  Lab 09/25/17 1648  AST 18  ALT 8*  ALKPHOS 110  BILITOT 0.6  PROT 6.1*  ALBUMIN 3.3*   No results for input(s): LIPASE, AMYLASE in the last 168 hours. CBC Recent Labs  Lab 09/25/17 1648  WBC 5.4  NEUTROABS 3.5  HGB 11.3*  HCT 35.5*  MCV 88.1  PLT 867   Basic Metabolic Panel Recent Labs  Lab 09/25/17 1648  NA 134*  K 4.5  CL 100*  CO2 23  GLUCOSE 114*  BUN 13  CREATININE 1.17*  CALCIUM 8.4*     Vitals:   09/25/17 1700 09/25/17 1715 09/25/17 1730 09/25/17 1800  BP: 117/73 124/87 126/64 115/71  Pulse: 79 81 83   Resp: _0 SpO2: 99% 100% 91%    Exam: Gen elderly pleasant WF, no distress No rash,  cyanosis or gangrene Sclera anicteric, throat clear  No jvd or bruits Chest clear bilat RRR no MRG Abd soft ntnd no mass or ascites +bs GU defer MS no joint effusions or deformity No pain w/ palpation of L shoulder, no rash on chest Ext no LE or UE edema / no  wounds or ulcers Neuro is alert, Ox 3 , nf    Home meds: -ecasa 81/ coreg 25 bid/ plavix 75 qd/ lasix 20 qd/ lipitor 40/ NTG patch 0.55m qd -pred pak/ zofran prn/ sl ntg prn/ relafen prn/ MVI/ antivert prn/ lido patch/ advil prn/ anusol prn/ norco prn/ voltaren gel prn/ elavil 20 hs   Na 134  K 4.5  BUN 13  Cr 1.17  Glu 114  Alb 3.3  LFT's ok  WBC 5k  Hb 11.3  Trop < 0.03     EKG (independ reviewed) > Sinus rhythm.  Anterior infarct, old.  Repol abnrm suggests ischemia, lateral leads.  T wave changes similar to priors.    CXR (independ reviewed) > no active disease. Stable elevated right hemidiaphragm is noted. Both lungs are clear.    Assessment: 1. Chest pain, hx CABG 2017 - no new EKG changes, trop negative.  Plan admit, serial trop, EKG in am.  2. Fatigue - question presyncopal episode.  Got NS bolus in ED and BP's are up.  Possibly was vol depleted , on po lasix.   3. Chronic pain 4. LE neuropathy/ bilat Charcot issues  5. SP R BKA - charcot +osteo 2017 6. Mac degeneratoin 7. DJD 8. DM2 - not on any medication now   Plan - as above       [place] [facility] Pager [phone]  If 7PM-7AM, please contact night-coverage [email or web address] Password [email or web address] 09/25/2017, 6:59 PM

## 2017-09-25 NOTE — ED Notes (Signed)
Meal and drink given to pt.  Pt is resting and appears comfortable. In NAD.

## 2017-09-25 NOTE — ED Notes (Signed)
Pt care assumed, pt is resting and appears comfortable with family at bedside.  Pt denies any cp/SOB/dizziness.

## 2017-09-25 NOTE — ED Provider Notes (Signed)
[facility] EMERGENCY DEPARTMENT Provider Note   CSN: [id number] Arrival date & time: 09/25/17  1615     History   Chief Complaint Chief Complaint  Patient presents with  . Chest Pain    HPI [name] is a 82 y.o. female.  HPI  82 year old female with a history of coronary disease and a CABG last year in [place] presents with chest pain.  She states she has been having left shoulder pain for couple days and saw an orthopedist yesterday.  This morning when she woke up she had a sharp pain in her epigastrium that went away after a few minutes.  Then at around 2 PM today developed some left-sided chest pain.  Described as a dull pain.  Has been constant since coming on.  Some associated shortness of breath. Also had associated diffuse fatigue when it started. It is not pleuritic.  There is no leg swelling.  Some nausea but no vomiting.  She was never diaphoretic.  She does not typically walk and so she is not sure if it is exertional.  Feels different than when she had the MI last year which was described as more of a gas/burping sensation.  The pain is currently a 5/10.  She was given aspirin by EMS and 1 nitroglycerin which dropped her blood pressure.  Nitroglycerin made her pain minimally better.  She states she has had some tenderness over her left chest wall after having a sticker placed there for heart monitoring over the last few weeks, she returned this last week.  However this chest pain seems different today.  Past Medical History:  Diagnosis Date  . Abnormal echocardiogram    09/2013: mild LVH, mild focal basal hypertrophy of septum, EF 60-65%, normal WM, grade 1 diastolic dysfunction, MAC, mild LAE, ASA, PASP 34. Possible oscillating MV density - reviewed by MD and felt there was no significant abnormality other than MAC noted with mitral valve and did not require TEE.  . Adenomatous colon polyp   . Allergic rhinitis   . Arthritis    "back; ankles;  hands; knees" (10/31/2013)  . Bilateral sensorineural hearing loss   . Bladder infection    frequently  . CAD (coronary artery disease)    a. Nonobst in 2011. b. Abnormal nuc 09/2013 -> s/p cutting balloon to D2, mild LAD disease; c. 08/2015 MV: no ischemia, EF 71%.  . Cancer ([place])    cancerous polyps  . Carcinoma in situ in a polyp 1994   a. 1994 - malignant polyp removed during colonoscopy.  . Charcot's Foot    a.  01/2016 s/p RLE transtibial amputation 2/2 Charcot rocker-bottom deformity and insensate neuropathy ulceration.  . Chronic diastolic CHF (congestive heart failure) ([place])    a. 09/2013 Echo: EF 60-65%, mild LVH, Gr1 DD.  Marland Kitchen Diverticulosis   . DJD (degenerative joint disease)   . DVT (deep venous thrombosis) ([place]) 12/2007  . Dyspnea    a. Chronic, extensive w/u see OV note 09-2010. b. [place] 2011: ormal with RA 5 RV 31/2 PA 27/12 (19) PCW 10 CO normal. No evidence of shunting with sitting up in cath lab. c. CPX 2011: see report. d. Prior fluoro of diaphragm -  R diaphragm elevated at rest but both moved with inspiration.   . Fatty liver   . GERD (gastroesophageal reflux disease)    a. Hx GERD/esophageal dysmotility followed by Dr. Olevia Perches.   Marland Kitchen Head injury due to trauma   . Headache  Optic migraine  . Heart attack ([place])    2017  . Hemorrhoids   . Hiatal hernia    a. s/p Nissen fundoplication 5053.  Marland Kitchen Hyperlipidemia    a. patient unwilling to use statins.  . Hypertensive heart disease   . IBS (irritable bowel syndrome)   . Macular degeneration 03/2009   Dr. Rosana Hoes  . Macular degeneration   . Myocardial infarction ([place])   . Neuropathy    a. Hands, feet, legs.  . Orthostatic hypotension   . Osteomyelitis ([place])    a. 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 01/2016 s/p RLE transtibial amputation 2/2 Charcot rocker-bottom deformity and insensate neuropathy ulceration.  . Osteoporosis   . PE (pulmonary embolism) 12/2007    a. PE/DVT after neck surgery 2009. b. coumadin d/c 10-2008.  Marland Kitchen Pneumonia 2015ish  . PONV (postoperative nausea and vomiting) 2009   neck surgery  . PPD positive   . Pulmonary nodule    incidental per CT:  Pet scan 4-9: likely benign, CT 08-2009 no change, no further CTs (Dr. Gwenette Greet)  . Recurrent UTI   . RSD (reflex sympathetic dystrophy)    a. Chronic pain.  Marland Kitchen Spinal stenosis   . Type II diabetes mellitus (H[facility])    no on medication  . Venous insufficiency    a. Contributing to LEE.    Patient Active Problem List   Diagnosis Date Noted  . History of three vessel coronary artery bypass - 2017 , in [facility] 09/25/2017  . PVD (peripheral vascular disease) ([place])-- stent L leg 06-2017 Dr Feliberto [facility] 07/23/2017  . Phantom pain ([place]) 04/01/2017  . Constipation, chronic 04/01/2017  . S/P unilateral BKA (below knee amputation), right ([place]) 12/14/2016  . Acquired absence of other left toe(s) ([place]) 12/14/2016  . Toe osteomyelitis, left ([place]) 11/17/2016  . Hypertensive heart disease   . Chronic diastolic CHF (congestive heart failure) ([place])   . Acute on chronic diastolic CHF (congestive heart failure), NYHA class 1 ([place]) 03/30/2016  . Acute on chronic diastolic CHF (congestive heart failure), NYHA class 3 ([place]) 03/30/2016  . Chest pain 03/30/2016  . Coronary artery disease due to lipid rich plaque 03/30/2016  . Pain in the chest 03/30/2016  . Statin intolerance 03/30/2016  . Acute renal failure superimposed on stage 3 chronic kidney disease ([place]) 03/30/2016  . S/P below knee amputation ([place]) 02/18/2016  . Head trauma 11/26/2015  . Memory loss 11/26/2015  . PCP NOTES >>>>>>>>>>>>>>>>>>>> 10/18/2015  . DOE (dyspnea on exertion) 09/01/2015  . S/P partial colectomy 01/08/2015  . DDD (degenerative disc disease), lumbar 06/02/2014  . 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 Anemia 01/18/2014  . Nonspecific abnormal unspecified cardiovascular function study 10/31/2013  .  Hemorrhoids 10/31/2013  . CAD (coronary artery disease)   . Carcinoma in situ in a polyp   . Depression 09/17/2013  . Charcot's joint 08/21/2013  . Osteoarthritis 08/21/2013  . Foot osteomyelitis, right ([place]) 07/16/2013  . Extrinsic asthma, unspecified 07/16/2013  . Chronic pain associated with significant psychosocial dysfunction 05/06/2013  . Lumbar and sacral osteoarthritis 04/22/2013  . GERD (gastroesophageal reflux disease)   . Neuropathy   . Hyperthyroidism   . Lower extremity edema 03/08/2011  . Annual physical exam 03/08/2011  . ALLERGIC RHINITIS 04/15/2010  . ORTHOSTATIC DIZZINESS 04/13/2010  . HIATAL HERNIA 12/06/2009  . GASTRIC ULCER, HX OF 12/06/2009  . DYSPNEA 03/18/2009  . Urinary tract infection 07/07/2008  .  DEGENERATIVE JOINT DISEASE 06/03/2008  . COLONIC POLYPS 01/27/2008  . DIVERTICULOSIS, COLON 01/27/2008  . IRRITABLE BOWEL SYNDROME, HX OF 01/27/2008  . Pulmonary nodule, right 01/22/2008  . Hyperlipidemia 12/30/2007  . Osteoporosis 12/30/2007  . TB SKIN TEST, POSITIVE 08/09/2007  . DM type 2 with diabetic peripheral neuropathy ([place]) 05/09/2007  . Reflex sympathetic dystrophy-- PAIN MNGMT  05/09/2007  . Essential hypertension 04/15/2007  . GERD 04/15/2007    Past Surgical History:  Procedure Laterality Date  . AMPUTATION  03/06/2012   Procedure: AMPUTATION FOOT;  Surgeon: Newt Minion, MD;  Location: [facility];  Service: Orthopedics;  Laterality: Left;  FIFTH RAY AMPUTATION   . AMPUTATION Right 02/18/2016   Procedure: AMPUTATION BELOW KNEE;  Surgeon: Newt Minion, MD;  Location: [facility];  Service: Orthopedics;  Laterality: Right;  . AMPUTATION Left 11/22/2016   Procedure: Amputation 4th Toe Left Foot at Metatarsophalangeal Joint;  Surgeon: Newt Minion, MD;  Location: [facility];  Service: Orthopedics;  Laterality: Left;  . ANKLE FUSION  09/27/2012   Procedure: ANKLE FUSION;  Surgeon: Newt Minion, MD;  Location: [facility];  Service: Orthopedics;  Laterality: Left;   Left Tibiocalcaneal Fusion  . ANKLE FUSION Right 05/09/2013   Procedure: ANKLE FUSION;  Surgeon: Newt Minion, MD;  Location: [facility];  Service: Orthopedics;  Laterality: Right;  Excision Osteomyelitis Base 1st MT Right Foot, Fusion Medial Column  . ANKLE SURGERY Left apr & june 1991  . ANTERIOR CERVICAL DECOMP/DISCECTOMY FUSION  12/18/07   For OA,  Dr. Lorin Mercy:  fu by a PE  . CARDIAC CATHETERIZATION  05/2010    at [facility]  . CARPAL TUNNEL RELEASE Left 09/2011  . CATARACT EXTRACTION W/ INTRAOCULAR LENS  IMPLANT, BILATERAL  2004   feb 2004 left, aug 2004 right  . CHOLECYSTECTOMY  03/2002  . COLONOSCOPY     numerous times  . CORONARY ANGIOPLASTY  10/31/2013  . CORONARY ARTERY BYPASS GRAFT  09/27/2016  . CORONARY ARTERY BYPASS GRAFT  09/2016   in [place].  . CYST REMOVAL HAND  06/2003  . FOOT BONE EXCISION Right 06/2009  . LAPAROSCOPIC RIGHT HEMI COLECTOMY N/A 01/08/2015   Procedure: LAPAROSCOPIC ASSISTED RIGHT HEMI COLECTOMY;  Surgeon: Johnathan Hausen, MD;  Location: [facility];  Service: General;  Laterality: N/A;  . LEFT HEART CATHETERIZATION WITH CORONARY ANGIOGRAM N/A 10/31/2013   Procedure: LEFT HEART CATHETERIZATION WITH CORONARY ANGIOGRAM;  Surgeon: Jettie Booze, MD;  Location: [facility] C[facility] LAB;  Service: Cardiovascular;  Laterality: N/A;  . NASAL SEPTUM SURGERY  09/1963  . NISSEN FUNDOPLICATION  10/24/9415  . PERCUTANEOUS CORONARY INTERVENTION-BALLOON ONLY  10/31/2013   Procedure: PERCUTANEOUS CORONARY INTERVENTION-BALLOON ONLY;  Surgeon: Jettie Booze, MD;  Location: [facility] CATH LAB;  Service: Cardiovascular;;  . ROTATOR CUFF REPAIR Right 07/2007   Dr. Percell Miller  . ROTATOR CUFF REPAIR Left 11/2004  . stent in left leg, behind knee  06/27/2017   done at [facility]ology in [facility]  . TOE AMPUTATION Right 08/2008   3rd toe, Dr. Sharol Given due to osteomyelitis  . VAGINAL HYSTERECTOMY  03/1975  . VEIN LIGATION Bilateral 03/1966  . VENA CAVA FILTER PLACEMENT  01/2010   green filter; "due to blood  clots"  . WISDOM TOOTH EXTRACTION  06/2007    OB History    No data available       Home Medications    Prior to Admission medications   Medication Sig Start Date End Date Taking? Authorizing Provider  Alpha-Lipoic Acid 600 MG CAPS Take 600 mg by mouth 2 (two) times daily.   Yes [provider]  amitriptyline (ELAVIL) 10 MG tablet Start with 1 tablet at bedtime, increase to 2 or 3 tablets as needed for phantom pain Patient taking differently: Take 20 mg by mouth at bedtime.  03/30/17  Yes Colon Branch, MD  aspirin 81 MG tablet Take 81 mg by mouth daily.   Yes [provider]  carvedilol (COREG) 25 MG tablet Take 25 mg by mouth 2 (two) times daily with a meal.   Yes [provider]  Cholecalciferol (VITAMIN D3) 5000 UNITS CAPS Take 5,000 Units by mouth every evening. Reported on 11/24/2015   Yes [provider]  clopidogrel (PLAVIX) 75 MG tablet Take 75 mg by mouth at bedtime.    Yes [provider]  Cyanocobalamin (VITAMIN B-12) 500 MCG SUBL Place 500 mcg under the tongue daily.   Yes [provider]  diclofenac sodium (VOLTAREN) 1 % GEL APPLY 2-4 GRAMS TO AFFECTED AREA 3 TIMES DAILY AS NEEDED 12/29/16  Yes Newt Minion, MD  furosemide (LASIX) 20 MG tablet Take 20 mg by mouth daily. 11/09/16  Yes [provider]  HYDROcodone-acetaminophen (NORCO) 5-325 MG tablet Take 1 tablet by mouth every 6 (six) hours as needed. 07/23/17  Yes Paz, Alda Berthold, MD  hydrocortisone (ANUSOL-HC) 25 MG suppository Place 1 suppository (25 mg total) rectally every 12 (twelve) hours. 02/01/17  Yes Nandigam, Venia Minks, MD  ibuprofen (ADVIL,MOTRIN) 200 MG tablet Take 200-400 mg by mouth every 8 (eight) hours as needed (for pain.).   Yes [provider]  lidocaine (LIDODERM) 5 % Place 1 patch onto the skin daily. Remove & Discard patch within 12 hours or as directed by MD 07/23/17  Yes Colon Branch, MD  meclizine (ANTIVERT) 25 MG tablet Take 1 tablet (25 mg  total) by mouth 2 (two) times daily as needed for dizziness. 09/18/16  Yes Paz, Alda Berthold, MD  Multiple Vitamins-Minerals (PRESERVISION AREDS 2) CAPS Take 1 capsule by mouth 2 (two) times daily.   Yes [provider]  nabumetone (RELAFEN) 750 MG tablet TAKE 1 TABLET BY MOUTH TWICE A DAY IF NEEDED FOR PAIN WITH FOOD Patient taking differently: TAKE 750 mg TABLET BY MOUTH TWICE A DAY IF NEEDED FOR PAIN WITH FOOD 09/17/17  Yes Newt Minion, MD  nitroGLYCERIN (NITROSTAT) 0.4 MG SL tablet Place 0.4 mg under the tongue every 5 (five) minutes as needed for chest pain (x 3 doses). Reported on 04/14/2016   Yes [provider]  omeprazole (PRILOSEC) 20 MG capsule TAKE 1 CAPSULE BY MOUTH 2 TIMES DAILY Patient taking differently: TAKE 20 mg CAPSULE BY MOUTH 2 TIMES DAILY 02/06/17  Yes Nandigam, Kavitha V, MD  ondansetron (ZOFRAN-ODT) 4 MG disintegrating tablet TAKE 1 TABLET BY MOUTH EVERY 8 HOURS AS NEEDED FOR NAUSEA OR VOMITING Patient taking differently: TAKE 4 mg TABLET BY MOUTH EVERY 8 HOURS AS NEEDED FOR NAUSEA OR VOMITING 07/16/17  Yes Melvenia Beam, MD  OVER THE COUNTER MEDICATION Take 1 capsule by mouth 2 (two) times daily. Integrative Digestive Formula   Yes [provider]  OVER THE COUNTER MEDICATION 1 tablet 3 (three) times daily. [place] Gluco Defense    Yes [provider]  Polyvinyl Alcohol-Povidone (REFRESH OP) Place 1 drop into both eyes 3 (three) times daily as needed (dry eyes).    Yes [provider]  atorvastatin (LIPITOR) 40 MG tablet Take 40  mg by mouth at bedtime.     [provider]  predniSONE (STERAPRED UNI-PAK 21 TAB) 5 MG (21) TBPK tablet Take 5 mg by mouth daily. 21 day pack 09/24/17   [provider]    Family History Family History  Problem Relation Age of Onset  . Heart disease Mother        mitral valve replaced  . Arthritis Mother   . Diabetic kidney disease Daughter   . Diabetes Father   . Stroke Father     . Migraines Sister   . Hypertension Unknown   . Breast cancer Unknown   . Headache Sister   . Colon cancer Neg Hx   . Esophageal cancer Neg Hx   . Stomach cancer Neg Hx   . Rectal cancer Neg Hx     Social History Social History   Tobacco Use  . Smoking status: Never Smoker  . Smokeless tobacco: Never Used  . Tobacco comment: tried for a few months in college.   Substance Use Topics  . Alcohol use: No    Alcohol/week: 0.0 oz  . Drug use: No     Allergies   Cymbalta [duloxetine hcl]; Gabapentin; Silicone; Metformin and related; Adhesive [tape]; Cefazolin; Ciprofloxacin; Clorazepate dipotassium; Darifenacin hydrobromide; Dilaudid [hydromorphone hcl]; Doxycycline; Enablex [darifenacin hydrobromide er]; Levofloxacin; Lyrica [pregabalin]; Methadone hcl; Morphine and related; and Talwin [pentazocine]   Review of Systems Review of Systems  Constitutional: Positive for fatigue.  Respiratory: Positive for shortness of breath.   Cardiovascular: Positive for chest pain. Negative for leg swelling.  Gastrointestinal: Positive for nausea. Negative for abdominal pain and vomiting.  Musculoskeletal: Positive for arthralgias (left shoulder). Negative for back pain.  All other systems reviewed and are negative.    Physical Exam Updated Vital Signs BP 118/77   Pulse 73   Resp (!) 21   SpO2 100%   Physical Exam  Constitutional: She is oriented to person, place, and time. She appears well-developed and well-nourished.  Non-toxic appearance. She does not appear ill.  HENT:  Head: Normocephalic and atraumatic.  Right Ear: External ear normal.  Left Ear: External ear normal.  Nose: Nose normal.  Eyes: Right eye exhibits no discharge. Left eye exhibits no discharge.  Cardiovascular: Normal rate, regular rhythm and normal heart sounds.  Pulses:      Radial pulses are 2+ on the right side, and 2+ on the left side.  Pulmonary/Chest: Effort normal and breath sounds normal. She exhibits  tenderness.    Abdominal: Soft. There is no tenderness.  Neurological: She is alert and oriented to person, place, and time.  Skin: Skin is warm and dry.  Nursing note and vitals reviewed.    ED Treatments / Results  Labs (all labs ordered are listed, but only abnormal results are displayed) Labs Reviewed  COMPREHENSIVE METABOLIC PANEL - Abnormal; Notable for the following components:      Result Value   Sodium 134 (*)    Chloride 100 (*)    Glucose, Bld 114 (*)    Creatinine, Ser 1.17 (*)    Calcium 8.4 (*)    Total Protein 6.1 (*)    Albumin 3.3 (*)    ALT 8 (*)    GFR calc non Af Amer 43 (*)    GFR calc Af Amer 50 (*)    All other components within normal limits  CBC WITH DIFFERENTIAL/PLATELET - Abnormal; Notable for the following components:   Hemoglobin 11.3 (*)    HCT 35.5 (*)  All other components within normal limits  TROPONIN I  TROPONIN I  TROPONIN I    EKG  EKG Interpretation  Date/Time:  Tuesday September 25 2017 16:28:51 EST Ventricular Rate:  80 PR Interval:    QRS Duration: 98 QT Interval:  410 QTC Calculation: 473 R Axis:   18 Text Interpretation:  Sinus rhythm Anterior infarct, old Repol abnrm suggests ischemia, lateral leads T wave changes similar to priors Confirmed by Sherwood Gambler [phone]) on 09/25/2017 4:32:10 PM       Radiology Dg Chest 2 View  Result Date: 09/25/2017 CLINICAL DATA:  Chest pain. EXAM: CHEST  2 VIEW COMPARISON:  Radiographs of October 15, 2015. FINDINGS: Stable cardiomediastinal silhouette. Sternotomy wires are noted. No pneumothorax or pleural effusion is noted. Stable elevated right hemidiaphragm is noted. Both lungs are clear. The visualized skeletal structures are unremarkable. IMPRESSION: No active cardiopulmonary disease. Electronically Signed   By: Marijo Conception, M.D.   On: 09/25/2017 18:11    Procedures Procedures (including critical care time)  Medications Ordered in ED Medications  amitriptyline  (ELAVIL) tablet 20 mg (20 mg Oral Given 09/25/17 2000)  aspirin EC tablet 81 mg (not administered)  carvedilol (COREG) tablet 25 mg (not administered)  cholecalciferol (VITAMIN D) tablet 5,000 Units (not administered)  clopidogrel (PLAVIX) tablet 75 mg (not administered)  vitamin B-12 (CYANOCOBALAMIN) tablet 500 mcg (not administered)  diclofenac sodium (VOLTAREN) 1 % transdermal gel 2 g (not administered)  HYDROcodone-acetaminophen (NORCO/VICODIN) 5-325 MG per tablet 1 tablet (not administered)  lidocaine (LIDODERM) 5 % 1 patch (not administered)  multivitamin (PROSIGHT) tablet 1 tablet (not administered)  nabumetone (RELAFEN) tablet 750 mg (not administered)  pantoprazole (PROTONIX) EC tablet 80 mg (not administered)  acetaminophen (TYLENOL) tablet 650 mg (not administered)  ondansetron (ZOFRAN) injection 4 mg (not administered)  enoxaparin (LOVENOX) injection 40 mg (not administered)  morphine 4 MG/ML injection 2 mg (not administered)  gi cocktail (Maalox,Lidocaine,Donnatal) (not administered)  0.9 %  sodium chloride infusion (not administered)  sodium chloride 0.9 % bolus 1,000 mL (0 mLs Intravenous Stopped 09/25/17 1904)     Initial Impression / Assessment and Plan / ED Course  I have reviewed the triage vital signs and the nursing notes.  Pertinent labs & imaging results that were available during my care of the patient were reviewed by me and considered in my medical decision making (see chart for details).     Pain resolved in the ED.  Her chest pain is atypical but she has a significant history with vascular disease and recent CABG.  While her coronary should be clean after 1 year of CABG, she is a high risk patient and thus will be observed for ACS rule out.  Dr. Melvia Heaps to admit.  Final Clinical Impressions(s) / ED Diagnoses   Final diagnoses:  Atypical chest pain    ED Discharge Orders    None       Sherwood Gambler, MD 09/25/17 2353

## 2017-09-25 NOTE — ED Notes (Signed)
Pt going to Xray

## 2017-09-25 NOTE — Care Management Obs Status (Signed)
[place] NOTIFICATION   Patient Details  Name: [name] MRN: [id number] Date of Birth: [birth year]   Medicare Observation Status Notification Given:  Yes    Apolonio Schneiders, RN 09/25/2017, 7:52 PM

## 2017-09-25 NOTE — ED Triage Notes (Signed)
Pt arrives via EMS from home with complaints of sudden, sharp left sided chest pain that began today with SOB, nausea. Pt endorses left should pain that began 2 days ago. EMS gave 324 ASA, nitro x1 and 4 mg zofran. Nitro made pain worse and dropped BP. EMS vitals 128/80, HR 86, 98% RA. Pt reports hx of MI last year.

## 2017-09-26 DIAGNOSIS — E1142 Type 2 diabetes mellitus with diabetic polyneuropathy: Secondary | ICD-10-CM

## 2017-09-26 DIAGNOSIS — I10 Essential (primary) hypertension: Secondary | ICD-10-CM | POA: Diagnosis not present

## 2017-09-26 DIAGNOSIS — R0789 Other chest pain: Secondary | ICD-10-CM | POA: Diagnosis not present

## 2017-09-26 DIAGNOSIS — R079 Chest pain, unspecified: Secondary | ICD-10-CM | POA: Diagnosis not present

## 2017-09-26 DIAGNOSIS — M1460 Charcot's joint, unspecified site: Secondary | ICD-10-CM | POA: Diagnosis not present

## 2017-09-26 DIAGNOSIS — R002 Palpitations: Secondary | ICD-10-CM | POA: Diagnosis not present

## 2017-09-26 LAB — BASIC METABOLIC PANEL
Anion gap: 9 (ref 5–15)
BUN: 13 mg/dL (ref 6–20)
CO2: 24 mmol/L (ref 22–32)
Calcium: 8.5 mg/dL — ABNORMAL LOW (ref 8.9–10.3)
Chloride: 102 mmol/L (ref 101–111)
Creatinine, Ser: 1.16 mg/dL — ABNORMAL HIGH (ref 0.44–1.00)
GFR calc Af Amer: 50 mL/min — ABNORMAL LOW (ref >60)
GFR calc non Af Amer: 43 mL/min — ABNORMAL LOW (ref >60)
Glucose, Bld: 113 mg/dL — ABNORMAL HIGH (ref 65–99)
Potassium: 4.2 mmol/L (ref 3.5–5.1)
Sodium: 135 mmol/L (ref 135–145)

## 2017-09-26 LAB — TROPONIN I
Troponin I: <0.03 ng/mL (ref <0.03)
Troponin I: <0.03 ng/mL (ref <0.03)

## 2017-09-26 MED ORDER — AMITRIPTYLINE HCL 10 MG PO TABS: 20.0000 mg | ORAL_TABLET | Freq: Every day | ORAL | Status: DC

## 2017-09-26 NOTE — ED Notes (Signed)
Pt found a hair in her un-sweet tea, pt did not drink the tea.

## 2017-09-26 NOTE — ED Notes (Signed)
Pt did not get her un sweet tea with her lunch, service response called and they said they would get it to her. Informed pt it would be here shortly asked if I could get her something till than pt said she was fine and would wait on it.

## 2017-09-26 NOTE — ED Notes (Signed)
Nursing Director for ER at bedside speaking with the patient's daughter.

## 2017-09-26 NOTE — ED Notes (Signed)
Pt family came to nurse station wanting to speak with the manger of the floor. CN informed

## 2017-09-26 NOTE — ED Notes (Signed)
Assisted the patient to the bedside commode.

## 2017-09-26 NOTE — ED Notes (Signed)
Heart Healthy diet - grilled chicken ceasar salad w/ and orange sherbert - lunch tray ordered @ 1204.

## 2017-09-26 NOTE — Discharge Summary (Signed)
Physician Discharge Summary  [name] ION:[id number] DOB: 01/02/1935 DOA: 09/25/2017  PCP: Colon Branch, MD  Admit date: 09/25/2017 Discharge date: 09/27/2017   Recommendations for Outpatient Follow-Up:   1. Patient to get holter monitor results from cardiology in [place]-- tele here shows PVCs   Discharge Diagnosis:   Principal Problem:   Chest pain Active Problems:   Hyperlipidemia   Essential hypertension   Neuropathy   Charcot's joint   Chronic pain associated with significant psychosocial dysfunction   Chronic diastolic CHF (congestive heart failure) (HCC)   S/P unilateral BKA (below knee amputation), right ([place])   History of three vessel coronary artery bypass - 2017 , in [facility]   Discharge disposition:  Home.  Discharge Condition: Improved.  Diet recommendation: Low sodium, heart healthy.  Carbohydrate-modified  Wound care: None.   History of Present Illness:   [name] is a 82 y.o. female with hx of with hx of DVT/ PE, Charcot foot problems / neuropathy LE's sp R BKA , sp CABG Dec 2017 (in [facility]), hx dysplastic polyps sp partial colectomy, came to ED today c/o chest pain.  Says she was having L shoulder pain for 2-3 days and saw ortho MD yest. She had some epig sharp pain after awakening this am but that improved and around 2 pm she stood up and felt like "all my energy was draining out of me". She had assoc CP and SOB and the CP has persisted.  No recent N/V, no diarrhea.  CP felt different than her MI last year, maybe gas / burping sensation.  In ED pain was 5/10, she got asa and 1 NTG which dropped her BP but improved her CP.  She is now improved w/ no CP.  We are asked to admit for CP , r/o.     Hospital Course by Problem:   1. Chest pain, hx CABG 2017 - CE negative-- chest wall tenderness, improved with voltaren gel 2. Fatigue - question presyncopal episode.  holding lasix for a few days, IVF with improvement--   3. Chronic pain 4. LE neuropathy/ bilat  Charcot issues  5. SP R BKA - charcot +osteo 2017 6. DM2 - not on any medication now, take herbal supplements       Medical Consultants:    None.   Discharge Exam:   Vitals:   09/26/17 1430 09/26/17 1501  BP: 132/65 (!) 145/81  Pulse: 80 88  Resp:  19  Temp:  98.1 F (36.7 C)  SpO2: 98% 100%   Vitals:   09/26/17 1330 09/26/17 1400 09/26/17 1430 09/26/17 1501  BP: (!) 110/51 127/70 132/65 (!) 145/81  Pulse: 74 79 80 88  Resp: 20 18  19   Temp:    98.1 F (36.7 C)  TempSrc:    Oral  SpO2: 99% 100% 98% 100%  Weight:    89.1 kg (196 lb 7 oz)  Height:    5\' 4"  (1.626 m)    Gen:  NAD    The results of significant diagnostics from this hospitalization (including imaging, microbiology, ancillary and laboratory) are listed below for reference.     Procedures and Diagnostic Studies:   Dg Chest 2 View  Result Date: 09/25/2017 CLINICAL DATA:  Chest pain. EXAM: CHEST  2 VIEW COMPARISON:  Radiographs of October 15, 2015. FINDINGS: Stable cardiomediastinal silhouette. Sternotomy wires are noted. No pneumothorax or pleural effusion is noted. Stable elevated right hemidiaphragm is noted. Both lungs are clear. The visualized skeletal structures  are unremarkable. IMPRESSION: No active cardiopulmonary disease. Electronically Signed   By: Marijo Conception, M.D.   On: 09/25/2017 18:11     Labs:   Basic Metabolic Panel: Recent Labs  Lab 09/25/17 1648 09/26/17 0512  NA 134* 135  K 4.5 4.2  CL 100* 102  CO2 23 24  GLUCOSE 114* 113*  BUN 13 13  CREATININE 1.17* 1.16*  CALCIUM 8.4* 8.5*   GFR Estimated Creatinine Clearance: 41.8 mL/min (A) (by C-G formula based on SCr of 1.16 mg/dL (H)). Liver Function Tests: Recent Labs  Lab 09/25/17 1648  AST 18  ALT 8*  ALKPHOS 110  BILITOT 0.6  PROT 6.1*  ALBUMIN 3.3*   No results for input(s): LIPASE, AMYLASE in the last 168 hours. No results for input(s): AMMONIA in the last 168 hours. Coagulation profile No results for  input(s): INR, PROTIME in the last 168 hours.  CBC: Recent Labs  Lab 09/25/17 1648  WBC 5.4  NEUTROABS 3.5  HGB 11.3*  HCT 35.5*  MCV 88.1  PLT 179   Cardiac Enzymes: Recent Labs  Lab 09/25/17 1648 09/25/17 2315 09/26/17 0512  TROPONINI <0.03 <0.03 <0.03   BNP: Invalid input(s): POCBNP CBG: No results for input(s): GLUCAP in the last 168 hours. D-Dimer No results for input(s): DDIMER in the last 72 hours. Hgb A1c No results for input(s): HGBA1C in the last 72 hours. Lipid Profile No results for input(s): CHOL, HDL, LDLCALC, TRIG, CHOLHDL, LDLDIRECT in the last 72 hours. Thyroid function studies No results for input(s): TSH, T4TOTAL, T3FREE, THYROIDAB in the last 72 hours.  Invalid input(s): FREET3 Anemia work up No results for input(s): VITAMINB12, FOLATE, FERRITIN, TIBC, IRON, RETICCTPCT in the last 72 hours. Microbiology No results found for this or any previous visit (from the past 240 hour(s)).   Discharge Instructions:   Discharge Instructions    Diet - low sodium heart healthy   Complete by:  As directed    Diet Carb Modified   Complete by:  As directed    Discharge instructions   Complete by:  As directed    Follow up with cardiology in [facility] for results of holter monitor Drink a consistent amount of fluids while taking lasix to avoid dehydration -resume lasix on 12/7   Increase activity slowly   Complete by:  As directed      Allergies as of 09/26/2017      Reactions   Cymbalta [duloxetine Hcl] Swelling   Swelling in legs   Gabapentin Swelling   Swelling in legs Swelling in legs   Silicone Hives, Itching, Dermatitis, Rash   Metformin And Related Other (See Comments)   dizzy, tired, chills, diarrhea, and nausea   Adhesive [tape] Rash   rash rash   Cefazolin Hives   Hives Hives   Ciprofloxacin Other (See Comments)   Other reaction(s): Other (See Comments) Per pt, caused body aches Per pt, caused body aches   Clorazepate Dipotassium Other  (See Comments)   Unknown reaction   Darifenacin Hydrobromide Other (See Comments)   hypotension, near syncope   Dilaudid [hydromorphone Hcl] Other (See Comments)    confused, intense itching   Doxycycline Rash   Enablex [darifenacin Hydrobromide Er] Other (See Comments)   Hypotension, near syncope   Levofloxacin Other (See Comments)   Causes wrist pain   Lyrica [pregabalin] Swelling   Swelling in legs   Methadone Hcl Other (See Comments)   Reaction unknown   Morphine And Related Other (See Comments)  Confusion, constipation.   Talwin [pentazocine] Other (See Comments)   "climbing walls" anxiety      Medication List    STOP taking these medications   atorvastatin 40 MG tablet Commonly known as:  LIPITOR   predniSONE 5 MG (21) Tbpk tablet Commonly known as:  STERAPRED UNI-PAK 21 TAB     TAKE these medications   Alpha-Lipoic Acid 600 MG Caps Take 600 mg by mouth 2 (two) times daily.   amitriptyline 10 MG tablet Commonly known as:  ELAVIL Take 2 tablets (20 mg total) by mouth at bedtime.   aspirin 81 MG tablet Take 81 mg by mouth daily.   carvedilol 25 MG tablet Commonly known as:  COREG Take 25 mg by mouth 2 (two) times daily with a meal.   clopidogrel 75 MG tablet Commonly known as:  PLAVIX Take 75 mg by mouth at bedtime.   diclofenac sodium 1 % Gel Commonly known as:  VOLTAREN APPLY 2-4 GRAMS TO AFFECTED AREA 3 TIMES DAILY AS NEEDED   furosemide 20 MG tablet Commonly known as:  LASIX Take 20 mg by mouth daily.   HYDROcodone-acetaminophen 5-325 MG tablet Commonly known as:  NORCO Take 1 tablet by mouth every 6 (six) hours as needed.   hydrocortisone 25 MG suppository Commonly known as:  ANUSOL-HC Place 1 suppository (25 mg total) rectally every 12 (twelve) hours.   ibuprofen 200 MG tablet Commonly known as:  ADVIL,MOTRIN Take 200-400 mg by mouth every 8 (eight) hours as needed (for pain.).   lidocaine 5 % Commonly known as:  LIDODERM Place 1  patch onto the skin daily. Remove & Discard patch within 12 hours or as directed by MD   meclizine 25 MG tablet Commonly known as:  ANTIVERT Take 1 tablet (25 mg total) by mouth 2 (two) times daily as needed for dizziness.   nabumetone 750 MG tablet Commonly known as:  RELAFEN TAKE 1 TABLET BY MOUTH TWICE A DAY IF NEEDED FOR PAIN WITH FOOD What changed:  See the new instructions.   nitroGLYCERIN 0.4 MG SL tablet Commonly known as:  NITROSTAT Place 0.4 mg under the tongue every 5 (five) minutes as needed for chest pain (x 3 doses). Reported on 04/14/2016   omeprazole 20 MG capsule Commonly known as:  PRILOSEC TAKE 1 CAPSULE BY MOUTH 2 TIMES DAILY What changed:  See the new instructions.   ondansetron 4 MG disintegrating tablet Commonly known as:  ZOFRAN-ODT TAKE 1 TABLET BY MOUTH EVERY 8 HOURS AS NEEDED FOR NAUSEA OR VOMITING What changed:  See the new instructions.   OVER THE COUNTER MEDICATION 1 tablet 3 (three) times daily. Berberorine Gluco Defense   OVER THE COUNTER MEDICATION Take 1 capsule by mouth 2 (two) times daily. Integrative Digestive Formula   PRESERVISION AREDS 2 Caps Take 1 capsule by mouth 2 (two) times daily.   REFRESH OP Place 1 drop into both eyes 3 (three) times daily as needed (dry eyes).   Vitamin B-12 500 MCG Subl Place 500 mcg under the tongue daily.   Vitamin D3 5000 units Caps Take 5,000 Units by mouth every evening. Reported on 11/24/2015         Time coordinating discharge: 35 min  Signed:  Geradine Girt   [facility] 09/27/2017, 7:51 AM

## 2017-09-26 NOTE — ED Notes (Signed)
MD at bedside evaluating the patient.

## 2017-09-26 NOTE — ED Notes (Signed)
Pt un sweet tea has arrived.

## 2017-09-26 NOTE — ED Notes (Signed)
Pt daughter at bedside. Pt BP became elevated. Will re-cycle after visitor is no longer raising her voice in the room.

## 2017-09-26 NOTE — Progress Notes (Signed)
Awake , alert and oriented female retrieved from [facility] and transported to [facility] via wheelchair to room 18. Pt ambulatory to bathroom then request to rest in bed. Prosthesis to right lower leg and brace to left leg. Nurse call bell at side, pt oriented to room. No distress noted at this time. No c/o at this time, Family in.

## 2017-09-28 ENCOUNTER — Telehealth: Payer: Self-pay | Admitting: Emergency Medicine

## 2017-09-28 NOTE — Telephone Encounter (Signed)
[phone]  another phone message opened on 09/28/17 pt calling back

## 2017-09-28 NOTE — Telephone Encounter (Signed)
I called the patient and let her no the ct dept is closed for the afternoon and Monday but I will call her tues and make her appt on either 12/26 or 12/27 per her request she has a eye apt at 1 on 12/27

## 2017-09-28 NOTE — Telephone Encounter (Signed)
Added this to the previous phone note that was still open

## 2017-10-03 NOTE — Telephone Encounter (Signed)
Pt aware of appt at [facility] 10/17/17

## 2017-10-05 DIAGNOSIS — G894 Chronic pain syndrome: Secondary | ICD-10-CM | POA: Diagnosis not present

## 2017-10-05 DIAGNOSIS — R2689 Other abnormalities of gait and mobility: Secondary | ICD-10-CM | POA: Diagnosis not present

## 2017-10-05 DIAGNOSIS — M6281 Muscle weakness (generalized): Secondary | ICD-10-CM | POA: Diagnosis not present

## 2017-10-05 DIAGNOSIS — M25512 Pain in left shoulder: Secondary | ICD-10-CM | POA: Diagnosis not present

## 2017-10-05 DIAGNOSIS — M25562 Pain in left knee: Secondary | ICD-10-CM | POA: Diagnosis not present

## 2017-10-05 DIAGNOSIS — G8921 Chronic pain due to trauma: Secondary | ICD-10-CM | POA: Diagnosis not present

## 2017-10-05 DIAGNOSIS — M545 Low back pain: Secondary | ICD-10-CM | POA: Diagnosis not present

## 2017-10-05 DIAGNOSIS — G8929 Other chronic pain: Secondary | ICD-10-CM | POA: Diagnosis not present

## 2017-10-08 DIAGNOSIS — I70213 Atherosclerosis of native arteries of extremities with intermittent claudication, bilateral legs: Secondary | ICD-10-CM | POA: Diagnosis not present

## 2017-10-10 DIAGNOSIS — M25512 Pain in left shoulder: Secondary | ICD-10-CM | POA: Diagnosis not present

## 2017-10-10 DIAGNOSIS — G8921 Chronic pain due to trauma: Secondary | ICD-10-CM | POA: Diagnosis not present

## 2017-10-10 DIAGNOSIS — R2689 Other abnormalities of gait and mobility: Secondary | ICD-10-CM | POA: Diagnosis not present

## 2017-10-10 DIAGNOSIS — M6281 Muscle weakness (generalized): Secondary | ICD-10-CM | POA: Diagnosis not present

## 2017-10-10 DIAGNOSIS — M25562 Pain in left knee: Secondary | ICD-10-CM | POA: Diagnosis not present

## 2017-10-10 DIAGNOSIS — M545 Low back pain: Secondary | ICD-10-CM | POA: Diagnosis not present

## 2017-10-10 DIAGNOSIS — G894 Chronic pain syndrome: Secondary | ICD-10-CM | POA: Diagnosis not present

## 2017-10-10 DIAGNOSIS — G8929 Other chronic pain: Secondary | ICD-10-CM | POA: Diagnosis not present

## 2017-10-12 DIAGNOSIS — R2689 Other abnormalities of gait and mobility: Secondary | ICD-10-CM | POA: Diagnosis not present

## 2017-10-12 DIAGNOSIS — M25562 Pain in left knee: Secondary | ICD-10-CM | POA: Diagnosis not present

## 2017-10-12 DIAGNOSIS — M545 Low back pain: Secondary | ICD-10-CM | POA: Diagnosis not present

## 2017-10-12 DIAGNOSIS — G8921 Chronic pain due to trauma: Secondary | ICD-10-CM | POA: Diagnosis not present

## 2017-10-12 DIAGNOSIS — G894 Chronic pain syndrome: Secondary | ICD-10-CM | POA: Diagnosis not present

## 2017-10-12 DIAGNOSIS — M6281 Muscle weakness (generalized): Secondary | ICD-10-CM | POA: Diagnosis not present

## 2017-10-12 DIAGNOSIS — G8929 Other chronic pain: Secondary | ICD-10-CM | POA: Diagnosis not present

## 2017-10-12 DIAGNOSIS — M25512 Pain in left shoulder: Secondary | ICD-10-CM | POA: Diagnosis not present

## 2017-10-17 ENCOUNTER — Ambulatory Visit (HOSPITAL_COMMUNITY)
Admission: RE | Admit: 2017-10-17 | Discharge: 2017-10-17 | Disposition: A | Discharge status: Home / Self Care | Payer: Medicare Other | Source: Ambulatory Visit | Attending: Emergency Medicine | Admitting: Emergency Medicine

## 2017-10-17 DIAGNOSIS — I700 Atherosclerosis of aorta: Secondary | ICD-10-CM | POA: Insufficient documentation

## 2017-10-17 DIAGNOSIS — R911 Solitary pulmonary nodule: Secondary | ICD-10-CM | POA: Insufficient documentation

## 2017-10-19 ENCOUNTER — Ambulatory Visit (INDEPENDENT_AMBULATORY_CARE_PROVIDER_SITE_OTHER): Payer: Medicare Other | Admitting: Internal Medicine

## 2017-10-19 ENCOUNTER — Encounter: Payer: Self-pay | Admitting: Internal Medicine

## 2017-10-19 VITALS — BP 117/67 | HR 81 | Temp 98.0°F | Resp 14 | Ht 63.0 in

## 2017-10-19 DIAGNOSIS — R079 Chest pain, unspecified: Secondary | ICD-10-CM

## 2017-10-19 DIAGNOSIS — I2510 Atherosclerotic heart disease of native coronary artery without angina pectoris: Secondary | ICD-10-CM

## 2017-10-19 DIAGNOSIS — M810 Age-related osteoporosis without current pathological fracture: Secondary | ICD-10-CM

## 2017-10-19 DIAGNOSIS — N390 Urinary tract infection, site not specified: Secondary | ICD-10-CM | POA: Diagnosis not present

## 2017-10-19 DIAGNOSIS — M1990 Unspecified osteoarthritis, unspecified site: Secondary | ICD-10-CM | POA: Diagnosis not present

## 2017-10-19 LAB — URINALYSIS, ROUTINE W REFLEX MICROSCOPIC
Bilirubin Urine: NEGATIVE
Hgb urine dipstick: NEGATIVE
Ketones, ur: NEGATIVE
Nitrite: POSITIVE — AB
RBC / HPF: NONE SEEN (ref 0–?)
Specific Gravity, Urine: 1.01 (ref 1.000–1.030)
Total Protein, Urine: NEGATIVE
Urine Glucose: NEGATIVE
Urobilinogen, UA: 0.2 (ref 0.0–1.0)
pH: 5.5 (ref 5.0–8.0)

## 2017-10-19 MED ORDER — HYDROCODONE-ACETAMINOPHEN 5-325 MG PO TABS: 1.0000 | ORAL_TABLET | Freq: Four times a day (QID) | ORAL | 0 refills | Status: DC | PRN

## 2017-10-19 NOTE — Patient Instructions (Signed)
GO TO THE LAB : Provide a urine sample     GO TO THE FRONT DESK Schedule your next appointment for a checkup in 3 months  Start over-the-counter vitamin E 400 units daily

## 2017-10-19 NOTE — Progress Notes (Signed)
Subjective:    Patient ID: [name], female    DOB: 1935-06-19, 82 y.o.   MRN: [id number]  DOS:  10/19/2017 Type of visit - description : Hospital follow-up Interval history: Admitted to the hospital recently, discharge 09/27/2017. She presented with substernal chest pain, shortness of breath.  Symptoms somewhat better with NTG. She was admitted, rule out for MI, CP improved with Voltaren gel. Also had fatigue, Lasix held, improved with IV fluids. Labs: Last creatinine 1.1.  LFTs were normal; hemoglobin 11.3 slt low but not far from baseline  Review of Systems Since she left the hospital she continue with chest pain, points to the L breast area. Reports the left breast is TTP, not red or swollen. She also has persistent pain around the left shoulder left arm. She actually saw physical therapy, about 10 days ago, they did some "dry needles" without much help.  Also possibly UTI, reports that she is having dysuria for several months, worse lately?. Denies fever chills.  + Occasional nausea, no vomiting. No flank pain. No vaginal discharge or bleeding. Has on and off rash at the inguinal area.   Past Medical History:  Diagnosis Date  . Abnormal echocardiogram    09/2013: mild LVH, mild focal basal hypertrophy of septum, EF 60-65%, normal WM, grade 1 diastolic dysfunction, MAC, mild LAE, ASA, PASP 34. Possible oscillating MV density - reviewed by MD and felt there was no significant abnormality other than MAC noted with mitral valve and did not require TEE.  . Adenomatous colon polyp   . Allergic rhinitis   . Arthritis    "back; ankles; hands; knees" (10/31/2013)  . Bilateral sensorineural hearing loss   . Bladder infection    frequently  . CAD (coronary artery disease)    a. Nonobst in 2011. b. Abnormal nuc 09/2013 -> s/p cutting balloon to D2, mild LAD disease; c. 08/2015 MV: no ischemia, EF 71%.  . Cancer ([place])    cancerous polyps  . Carcinoma in situ in a polyp 1994   a. 1994 - malignant polyp removed during colonoscopy.  . Charcot's Foot    a.  01/2016 s/p RLE transtibial amputation 2/2 Charcot rocker-bottom deformity and insensate neuropathy ulceration.  . Chronic diastolic CHF (congestive heart failure) ([place])    a. 09/2013 Echo: EF 60-65%, mild LVH, Gr1 DD.  Marland Kitchen Diverticulosis   . DJD (degenerative joint disease)   . DVT (deep venous thrombosis) ([place]) 12/2007  . Dyspnea    a. Chronic, extensive w/u see OV note 09-2010. b. [place] 2011: ormal with RA 5 RV 31/2 PA 27/12 (19) PCW 10 CO normal. No evidence of shunting with sitting up in cath lab. c. CPX 2011: see report. d. Prior fluoro of diaphragm -  R diaphragm elevated at rest but both moved with inspiration.   . Fatty liver   . GERD (gastroesophageal reflux disease)    a. Hx GERD/esophageal dysmotility followed by Dr. Olevia Perches.   Marland Kitchen Head injury due to trauma   . Headache    Optic migraine  . Heart attack ([place])    2017  . Hemorrhoids   . Hiatal hernia    a. s/p Nissen fundoplication 6073.  Marland Kitchen Hyperlipidemia    a. patient unwilling to use statins.  . Hypertensive heart disease   . IBS (irritable bowel syndrome)   . Macular degeneration 03/2009   Dr. Rosana Hoes  . Macular degeneration   . Myocardial infarction ([place])   . Neuropathy    a. Hands,  feet, legs.  . Orthostatic hypotension   . Osteomyelitis ([place])    a. 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 01/2016 s/p RLE transtibial amputation 2/2 Charcot rocker-bottom deformity and insensate neuropathy ulceration.  . Osteoporosis   . PE (pulmonary embolism) 12/2007   a. PE/DVT after neck surgery 2009. b. coumadin d/c 10-2008.  Marland Kitchen Pneumonia 2015ish  . PONV (postoperative nausea and vomiting) 2009   neck surgery  . PPD positive   . Pulmonary nodule    incidental per CT:  Pet scan 4-9: likely benign, CT 08-2009 no change, no further CTs (Dr. Gwenette Greet)  . Recurrent UTI   . RSD (reflex sympathetic dystrophy)      a. Chronic pain.  Marland Kitchen Spinal stenosis   . Type II diabetes mellitus (HCC)    no on medication  . Venous insufficiency    a. Contributing to LEE.    Past Surgical History:  Procedure Laterality Date  . AMPUTATION  03/06/2012   Procedure: AMPUTATION FOOT;  Surgeon: Newt Minion, MD;  Location: [place];  Service: Orthopedics;  Laterality: Left;  FIFTH RAY AMPUTATION   . AMPUTATION Right 02/18/2016   Procedure: AMPUTATION BELOW KNEE;  Surgeon: Newt Minion, MD;  Location: [place];  Service: Orthopedics;  Laterality: Right;  . AMPUTATION Left 11/22/2016   Procedure: Amputation 4th Toe Left Foot at Metatarsophalangeal Joint;  Surgeon: Newt Minion, MD;  Location: [place];  Service: Orthopedics;  Laterality: Left;  . ANKLE FUSION  09/27/2012   Procedure: ANKLE FUSION;  Surgeon: Newt Minion, MD;  Location: [place];  Service: Orthopedics;  Laterality: Left;  Left Tibiocalcaneal Fusion  . ANKLE FUSION Right 05/09/2013   Procedure: ANKLE FUSION;  Surgeon: Newt Minion, MD;  Location: [place];  Service: Orthopedics;  Laterality: Right;  Excision Osteomyelitis Base 1st MT Right Foot, Fusion Medial Column  . ANKLE SURGERY Left apr & june 1991  . ANTERIOR CERVICAL DECOMP/DISCECTOMY FUSION  12/18/07   For OA,  Dr. Lorin Mercy:  fu by a PE  . CARDIAC CATHETERIZATION  05/2010    at [facility]  . CARPAL TUNNEL RELEASE Left 09/2011  . CATARACT EXTRACTION W/ INTRAOCULAR LENS  IMPLANT, BILATERAL  2004   feb 2004 left, aug 2004 right  . CHOLECYSTECTOMY  03/2002  . COLONOSCOPY     numerous times  . CORONARY ANGIOPLASTY  10/31/2013  . CORONARY ARTERY BYPASS GRAFT  09/27/2016  . CORONARY ARTERY BYPASS GRAFT  09/2016   in [place].  . CYST REMOVAL HAND  06/2003  . FOOT BONE EXCISION Right 06/2009  . LAPAROSCOPIC RIGHT HEMI COLECTOMY N/A 01/08/2015   Procedure: LAPAROSCOPIC ASSISTED RIGHT HEMI COLECTOMY;  Surgeon: Johnathan Hausen, MD;  Location: [facility];  Service: General;  Laterality: N/A;  . LEFT HEART CATHETERIZATION WITH CORONARY  ANGIOGRAM N/A 10/31/2013   Procedure: LEFT HEART CATHETERIZATION WITH CORONARY ANGIOGRAM;  Surgeon: Jettie Booze, MD;  Location: [facility] CATH LAB;  Service: Cardiovascular;  Laterality: N/A;  . NASAL SEPTUM SURGERY  09/1963  . NISSEN FUNDOPLICATION  05/24/4234  . PERCUTANEOUS CORONARY INTERVENTION-BALLOON ONLY  10/31/2013   Procedure: PERCUTANEOUS CORONARY INTERVENTION-BALLOON ONLY;  Surgeon: Jettie Booze, MD;  Location: [facility] CATH LAB;  Service: Cardiovascular;;  . ROTATOR CUFF REPAIR Right 07/2007   Dr. Percell Miller  . ROTATOR CUFF REPAIR Left 11/2004  . stent in left leg, behind knee  06/27/2017   done at [facility]ology in [facility]  .  TOE AMPUTATION Right 08/2008   3rd toe, Dr. Sharol Given due to osteomyelitis  . VAGINAL HYSTERECTOMY  03/1975  . VEIN LIGATION Bilateral 03/1966  . VENA CAVA FILTER PLACEMENT  01/2010   green filter; "due to blood clots"  . WISDOM TOOTH EXTRACTION  06/2007    Social History   Socioeconomic History  . Marital status: Divorced    Spouse name: Not on file  . Number of children: 3  . Years of education: 53  . Highest education level: Not on file  Social Needs  . Financial resource strain: Not on file  . Food insecurity - worry: Not on file  . Food insecurity - inability: Not on file  . Transportation needs - medical: Not on file  . Transportation needs - non-medical: Not on file  Occupational History  . Occupation: Retired     Comment: from Orthoptist   Tobacco Use  . Smoking status: Never Smoker  . Smokeless tobacco: Never Used  . Tobacco comment: tried for a few months in college.   Substance and Sexual Activity  . Alcohol use: No    Alcohol/week: 0.0 oz  . Drug use: No  . Sexual activity: No    Birth control/protection: Post-menopausal  Other Topics Concern  . Not on file  Social History Narrative   Daughter lives w/ her and another daughter lives in town   1 son in [place]   Son comes home every 4 weeks to visit   Caffeine use:  Tea 1/day  w/ dinner   Coffee occass             Allergies as of 10/19/2017      Reactions   Cymbalta [duloxetine Hcl] Swelling   Swelling in legs   Gabapentin Swelling   Swelling in legs Swelling in legs   Silicone Hives, Itching, Dermatitis, Rash   Metformin And Related Other (See Comments)   dizzy, tired, chills, diarrhea, and nausea   Adhesive [tape] Rash   rash rash   Cefazolin Hives   Hives Hives   Ciprofloxacin Other (See Comments)   Other reaction(s): Other (See Comments) Per pt, caused body aches Per pt, caused body aches   Clorazepate Dipotassium Other (See Comments)   Unknown reaction   Darifenacin Hydrobromide Other (See Comments)   hypotension, near syncope   Dilaudid [hydromorphone Hcl] Other (See Comments)    confused, intense itching   Doxycycline Rash   Enablex [darifenacin Hydrobromide Er] Other (See Comments)   Hypotension, near syncope   Levofloxacin Other (See Comments)   Causes wrist pain   Lyrica [pregabalin] Swelling   Swelling in legs   Methadone Hcl Other (See Comments)   Reaction unknown   Morphine And Related Other (See Comments)   Confusion, constipation.   Talwin [pentazocine] Other (See Comments)   "climbing walls" anxiety      Medication List        Accurate as of 10/19/17 11:59 PM. Always use your most recent med list.          Alpha-Lipoic Acid 600 MG Caps Take 600 mg by mouth 2 (two) times daily.   amitriptyline 10 MG tablet Commonly known as:  ELAVIL Take 2 tablets (20 mg total) by mouth at bedtime.   aspirin 81 MG tablet Take 81 mg by mouth daily.   carvedilol 25 MG tablet Commonly known as:  COREG Take 25 mg by mouth 2 (two) times daily with a meal.   clopidogrel 75 MG tablet Commonly known  as:  PLAVIX Take 75 mg by mouth at bedtime.   diclofenac sodium 1 % Gel Commonly known as:  VOLTAREN APPLY 2-4 GRAMS TO AFFECTED AREA 3 TIMES DAILY AS NEEDED   furosemide 20 MG tablet Commonly known as:  LASIX Take 20 mg by  mouth daily.   HYDROcodone-acetaminophen 5-325 MG tablet Commonly known as:  NORCO Take 1 tablet by mouth every 6 (six) hours as needed for moderate pain.   HYDROcodone-acetaminophen 5-325 MG tablet Commonly known as:  NORCO/VICODIN Take 1 tablet by mouth every 6 (six) hours as needed for moderate pain.   HYDROcodone-acetaminophen 5-325 MG tablet Commonly known as:  NORCO/VICODIN Take 1 tablet by mouth every 6 (six) hours as needed for moderate pain.   hydrocortisone 25 MG suppository Commonly known as:  ANUSOL-HC Place 1 suppository (25 mg total) rectally every 12 (twelve) hours.   ibuprofen 200 MG tablet Commonly known as:  ADVIL,MOTRIN Take 200-400 mg by mouth every 8 (eight) hours as needed (for pain.).   lidocaine 5 % Commonly known as:  LIDODERM Place 1 patch onto the skin daily. Remove & Discard patch within 12 hours or as directed by MD   meclizine 25 MG tablet Commonly known as:  ANTIVERT Take 1 tablet (25 mg total) by mouth 2 (two) times daily as needed for dizziness.   nabumetone 750 MG tablet Commonly known as:  RELAFEN TAKE 1 TABLET BY MOUTH TWICE A DAY IF NEEDED FOR PAIN WITH FOOD   nitroGLYCERIN 0.4 MG SL tablet Commonly known as:  NITROSTAT Place 0.4 mg under the tongue every 5 (five) minutes as needed for chest pain (x 3 doses). Reported on 04/14/2016   omeprazole 20 MG capsule Commonly known as:  PRILOSEC TAKE 1 CAPSULE BY MOUTH 2 TIMES DAILY   ondansetron 4 MG disintegrating tablet Commonly known as:  ZOFRAN-ODT TAKE 1 TABLET BY MOUTH EVERY 8 HOURS AS NEEDED FOR NAUSEA OR VOMITING   OVER THE COUNTER MEDICATION 1 tablet 3 (three) times daily. Berberorine Gluco Defense   OVER THE COUNTER MEDICATION Take 1 capsule by mouth 2 (two) times daily. Integrative Digestive Formula   PRESERVISION AREDS 2 Caps Take 1 capsule by mouth 2 (two) times daily.   REFRESH OP Place 1 drop into both eyes 3 (three) times daily as needed (dry eyes).   Vitamin B-12  500 MCG Subl Place 500 mcg under the tongue daily.   Vitamin D3 5000 units Caps Take 5,000 Units by mouth every evening. Reported on 11/24/2015          Objective:   Physical Exam BP 117/67 (BP Location: Left Arm, Patient Position: Sitting, Cuff Size: Small)   Pulse 81   Temp 98 F (36.7 C) (Oral)   Resp 14   Ht 5' 3"  (1.6 m)   SpO2 100%   BMI 34.80 kg/m  General:   Well developed, well nourished . NAD.  HEENT:  Normocephalic . Face symmetric, atraumatic Breast: Right breast: No dominant mass, skin and nipples normal to inspection on palpation, axillary areas without mass or lymphadenopathy Left breast: Similar findings, normal except that is slightly TTP.  No redness, swelling or nipple discharge. Lungs:  CTA B Normal respiratory effort, no intercostal retractions, no accessory muscle use. Heart: RRR,  no murmur.  Abdomen:  Not distended, soft, minimal discomfort at the suprapubic area with no rebound or rigidity.   Skin: Currently with no rash at the groin. Neurologic:  alert & oriented X3.  Speech normal, gait not tested  Psych--  Cognition and judgment appear intact.  Cooperative with normal attention span and concentration.  Behavior appropriate. No anxious or depressed appearing.     Assessment & Plan:    Assessment DM, Neuropathy, amputations due to Charcot feet. - metformin intolerant  (lethargic) HTN  Hyperlipidemia-- won't take statins  LUNG MASS -incidental per CT 2009,  PET scan 01-2008: likely benign, CT 08-2009 no change, no further CTs per Dr Gwenette Greet - Had a CT in [place] 09-2016 "spiculated mass", saw Dr Lamonte Sakai,  CT  09/2017 stable CV: ---CAD sees Dr Meda Coffee and a cardiologist in [facility] --NSTEMI 09-2016. Outside hospital: Cath 2 vessel disease, CABG 2, LIMA to LAD and Diag   ---Chronic diastolic CHF ---PVD: Aortogram and a stent L Leg Dr Feliberto Gottron, 06-27-2017 @ [place] (per pt) GI: GERD, IBS, colon polyps, PUD, abdominal pain "post polypectomy  syndrome" [place] meds prn (antivert-zofran, gets nausea when car traveling) Osteoporosis  MSK,pain mngmt : --Reflex sympathetic dystrophy --Neuropathy --DJD --Spinal stenosis  --H/o Osteomyelitis  Foot --s/p amputation:  2 L toes , R BKA  (01-2016, dx osteomyelitis) w/ phantom pain  -- sees Dr Sharol Given prn Takes nortriptyline also for leg cramps. She had local injections with Dr. Maia Petties 2016 w/o apparent help. She took oxycodone after surgery and that did NOT seem to help better than hydrocodone. Intolerant to Cymbalta, Neurontin ; Lyrica helped but caused edema. Recurrent UTIs -- bactrim q d Venous insufficiency  H/o  + PPD H/o hypothyroidism: TSHs stable H/o dyspnea  PLAN:  Here w/ Vonna Kotyk (g-son) Chest pain: Admitted recently with chest pain, she r/o MI, pain atypical, see next. Mastalgia: Left mastalgia without evidence of infection.  Last MMG -12-2016, recommend vitamin E 400 mg, re-do MMG 12-2017. UTI?  Very mild sxs, h/o recurrent UTI, at some point took Bactrim qd but has not been doing that in a while.  Will check a UA, urine culture, treat based on results. DJD: L shoulder and arm pain going on for several months, noncardiac, recommend to discuss with PT and Ortho. Osteoporosis: See previous entries, unable to get Prolia for now. Pain management: Prescriptions sent Labs from recent admission noted, recheck BMP and CBC on RTC RTC 3 months

## 2017-10-19 NOTE — Progress Notes (Signed)
Pre visit review using our clinic review tool, if applicable. No additional management support is needed unless otherwise documented below in the visit note. 

## 2017-10-21 LAB — URINE CULTURE
MICRO NUMBER:: 81457220
SPECIMEN QUALITY:: ADEQUATE

## 2017-10-21 NOTE — Assessment & Plan Note (Signed)
Here w/ Vonna Kotyk (g-son) Chest pain: Admitted recently with chest pain, she r/o MI, pain atypical, see next. Mastalgia: Left mastalgia without evidence of infection.  Last MMG -12-2016, recommend vitamin E 400 mg, re-do MMG 12-2017. UTI?  Very mild sxs, h/o recurrent UTI, at some point took Bactrim qd but has not been doing that in a while.  Will check a UA, urine culture, treat based on results. DJD: L shoulder and arm pain going on for several months, noncardiac, recommend to discuss with PT and Ortho. Osteoporosis: See previous entries, unable to get Prolia for now. Pain management: Prescriptions sent Labs from recent admission noted, recheck BMP and CBC on RTC RTC 3 months

## 2017-10-22 DIAGNOSIS — H01004 Unspecified blepharitis left upper eyelid: Secondary | ICD-10-CM | POA: Diagnosis not present

## 2017-10-22 DIAGNOSIS — H01001 Unspecified blepharitis right upper eyelid: Secondary | ICD-10-CM | POA: Diagnosis not present

## 2017-10-22 DIAGNOSIS — H04123 Dry eye syndrome of bilateral lacrimal glands: Secondary | ICD-10-CM | POA: Diagnosis not present

## 2017-10-22 DIAGNOSIS — H01005 Unspecified blepharitis left lower eyelid: Secondary | ICD-10-CM | POA: Diagnosis not present

## 2017-10-22 DIAGNOSIS — H01002 Unspecified blepharitis right lower eyelid: Secondary | ICD-10-CM | POA: Diagnosis not present

## 2017-10-24 ENCOUNTER — Other Ambulatory Visit: Payer: Self-pay | Admitting: *Deleted

## 2017-10-24 MED ORDER — NITROFURANTOIN MONOHYD MACRO 100 MG PO CAPS: 100.0000 mg | ORAL_CAPSULE | Freq: Two times a day (BID) | ORAL | 0 refills | Status: DC

## 2017-10-26 DIAGNOSIS — M25562 Pain in left knee: Secondary | ICD-10-CM | POA: Diagnosis not present

## 2017-10-26 DIAGNOSIS — R2689 Other abnormalities of gait and mobility: Secondary | ICD-10-CM | POA: Diagnosis not present

## 2017-10-26 DIAGNOSIS — M6281 Muscle weakness (generalized): Secondary | ICD-10-CM | POA: Diagnosis not present

## 2017-10-26 DIAGNOSIS — G8921 Chronic pain due to trauma: Secondary | ICD-10-CM | POA: Diagnosis not present

## 2017-10-26 DIAGNOSIS — M25512 Pain in left shoulder: Secondary | ICD-10-CM | POA: Diagnosis not present

## 2017-10-26 DIAGNOSIS — G8929 Other chronic pain: Secondary | ICD-10-CM | POA: Diagnosis not present

## 2017-10-26 DIAGNOSIS — M545 Low back pain: Secondary | ICD-10-CM | POA: Diagnosis not present

## 2017-10-26 DIAGNOSIS — G894 Chronic pain syndrome: Secondary | ICD-10-CM | POA: Diagnosis not present

## 2017-10-29 ENCOUNTER — Telehealth: Payer: Self-pay

## 2017-10-29 MED ORDER — NITROFURANTOIN MONOHYD MACRO 100 MG PO CAPS: 100.0000 mg | ORAL_CAPSULE | Freq: Two times a day (BID) | ORAL | 0 refills | Status: DC

## 2017-10-29 NOTE — Telephone Encounter (Signed)
Okay, call 3 additional days of Macrobid 100 mg twice daily #6 no refills

## 2017-10-29 NOTE — Telephone Encounter (Signed)
Rx sent 

## 2017-10-29 NOTE — Telephone Encounter (Signed)
Copied from [place]. Topic: Quick Communication - Lab Results >> Oct 24, 2017  3:44 PM Damita Dunnings, CMA wrote: Called patient to inform them of lab/urine results from 10/19/2017. When patient returns call, triage nurse may disclose results. >> Oct 29, 2017  2:24 PM Percell Belt A wrote: Pt state that she is not fully better from the uti and would like to know if Dr can call more of the nitrofurantoin, macrocrystal-monohydrate, (MACROBID) 100 MG capsule in for her.  Only have 2 days of meds left   Also need a refill on her [place] [facility] [address] [phone]  Best number [phone] she is staying with son

## 2017-10-29 NOTE — Telephone Encounter (Signed)
Please advise- Pt still not completely better w/ UTI- she has 2 days left of Macrobid- she is requesting refill?

## 2017-10-31 DIAGNOSIS — M6281 Muscle weakness (generalized): Secondary | ICD-10-CM | POA: Diagnosis not present

## 2017-10-31 DIAGNOSIS — G8929 Other chronic pain: Secondary | ICD-10-CM | POA: Diagnosis not present

## 2017-10-31 DIAGNOSIS — G8921 Chronic pain due to trauma: Secondary | ICD-10-CM | POA: Diagnosis not present

## 2017-10-31 DIAGNOSIS — M25562 Pain in left knee: Secondary | ICD-10-CM | POA: Diagnosis not present

## 2017-10-31 DIAGNOSIS — R2689 Other abnormalities of gait and mobility: Secondary | ICD-10-CM | POA: Diagnosis not present

## 2017-10-31 DIAGNOSIS — G894 Chronic pain syndrome: Secondary | ICD-10-CM | POA: Diagnosis not present

## 2017-10-31 DIAGNOSIS — M25512 Pain in left shoulder: Secondary | ICD-10-CM | POA: Diagnosis not present

## 2017-10-31 DIAGNOSIS — M545 Low back pain: Secondary | ICD-10-CM | POA: Diagnosis not present

## 2017-11-02 DIAGNOSIS — M25562 Pain in left knee: Secondary | ICD-10-CM | POA: Diagnosis not present

## 2017-11-02 DIAGNOSIS — M6281 Muscle weakness (generalized): Secondary | ICD-10-CM | POA: Diagnosis not present

## 2017-11-02 DIAGNOSIS — M545 Low back pain: Secondary | ICD-10-CM | POA: Diagnosis not present

## 2017-11-02 DIAGNOSIS — R2689 Other abnormalities of gait and mobility: Secondary | ICD-10-CM | POA: Diagnosis not present

## 2017-11-02 DIAGNOSIS — M25512 Pain in left shoulder: Secondary | ICD-10-CM | POA: Diagnosis not present

## 2017-11-02 DIAGNOSIS — G8921 Chronic pain due to trauma: Secondary | ICD-10-CM | POA: Diagnosis not present

## 2017-11-02 DIAGNOSIS — E119 Type 2 diabetes mellitus without complications: Secondary | ICD-10-CM | POA: Diagnosis not present

## 2017-11-02 DIAGNOSIS — I25728 Atherosclerosis of autologous artery coronary artery bypass graft(s) with other forms of angina pectoris: Secondary | ICD-10-CM | POA: Diagnosis not present

## 2017-11-02 DIAGNOSIS — G8929 Other chronic pain: Secondary | ICD-10-CM | POA: Diagnosis not present

## 2017-11-02 DIAGNOSIS — G894 Chronic pain syndrome: Secondary | ICD-10-CM | POA: Diagnosis not present

## 2017-11-02 DIAGNOSIS — I10 Essential (primary) hypertension: Secondary | ICD-10-CM | POA: Diagnosis not present

## 2017-11-02 DIAGNOSIS — E782 Mixed hyperlipidemia: Secondary | ICD-10-CM | POA: Diagnosis not present

## 2017-11-07 ENCOUNTER — Other Ambulatory Visit: Payer: Self-pay | Admitting: Internal Medicine

## 2017-11-09 ENCOUNTER — Ambulatory Visit: Payer: Medicare Other | Admitting: Internal Medicine

## 2017-11-09 DIAGNOSIS — M545 Low back pain: Secondary | ICD-10-CM | POA: Diagnosis not present

## 2017-11-09 DIAGNOSIS — I7025 Atherosclerosis of native arteries of other extremities with ulceration: Secondary | ICD-10-CM | POA: Diagnosis not present

## 2017-11-09 DIAGNOSIS — G8929 Other chronic pain: Secondary | ICD-10-CM | POA: Diagnosis not present

## 2017-11-09 DIAGNOSIS — I5022 Chronic systolic (congestive) heart failure: Secondary | ICD-10-CM | POA: Diagnosis not present

## 2017-11-09 DIAGNOSIS — I70208 Unspecified atherosclerosis of native arteries of extremities, other extremity: Secondary | ICD-10-CM | POA: Diagnosis not present

## 2017-11-09 DIAGNOSIS — G8921 Chronic pain due to trauma: Secondary | ICD-10-CM | POA: Diagnosis not present

## 2017-11-09 DIAGNOSIS — G894 Chronic pain syndrome: Secondary | ICD-10-CM | POA: Diagnosis not present

## 2017-11-09 DIAGNOSIS — I10 Essential (primary) hypertension: Secondary | ICD-10-CM | POA: Diagnosis not present

## 2017-11-09 DIAGNOSIS — M25562 Pain in left knee: Secondary | ICD-10-CM | POA: Diagnosis not present

## 2017-11-09 DIAGNOSIS — R2689 Other abnormalities of gait and mobility: Secondary | ICD-10-CM | POA: Diagnosis not present

## 2017-11-09 DIAGNOSIS — M25512 Pain in left shoulder: Secondary | ICD-10-CM | POA: Diagnosis not present

## 2017-11-09 DIAGNOSIS — M6281 Muscle weakness (generalized): Secondary | ICD-10-CM | POA: Diagnosis not present

## 2017-11-09 DIAGNOSIS — I25728 Atherosclerosis of autologous artery coronary artery bypass graft(s) with other forms of angina pectoris: Secondary | ICD-10-CM | POA: Diagnosis not present

## 2017-11-14 DIAGNOSIS — I70213 Atherosclerosis of native arteries of extremities with intermittent claudication, bilateral legs: Secondary | ICD-10-CM | POA: Diagnosis not present

## 2017-11-16 DIAGNOSIS — M25512 Pain in left shoulder: Secondary | ICD-10-CM | POA: Diagnosis not present

## 2017-11-16 DIAGNOSIS — M545 Low back pain: Secondary | ICD-10-CM | POA: Diagnosis not present

## 2017-11-16 DIAGNOSIS — M25562 Pain in left knee: Secondary | ICD-10-CM | POA: Diagnosis not present

## 2017-11-16 DIAGNOSIS — G8921 Chronic pain due to trauma: Secondary | ICD-10-CM | POA: Diagnosis not present

## 2017-11-16 DIAGNOSIS — R2689 Other abnormalities of gait and mobility: Secondary | ICD-10-CM | POA: Diagnosis not present

## 2017-11-16 DIAGNOSIS — G8929 Other chronic pain: Secondary | ICD-10-CM | POA: Diagnosis not present

## 2017-11-16 DIAGNOSIS — M6281 Muscle weakness (generalized): Secondary | ICD-10-CM | POA: Diagnosis not present

## 2017-11-16 DIAGNOSIS — G894 Chronic pain syndrome: Secondary | ICD-10-CM | POA: Diagnosis not present

## 2017-11-19 DIAGNOSIS — I10 Essential (primary) hypertension: Secondary | ICD-10-CM | POA: Diagnosis not present

## 2017-11-19 DIAGNOSIS — I25728 Atherosclerosis of autologous artery coronary artery bypass graft(s) with other forms of angina pectoris: Secondary | ICD-10-CM | POA: Diagnosis not present

## 2017-11-21 ENCOUNTER — Other Ambulatory Visit (INDEPENDENT_AMBULATORY_CARE_PROVIDER_SITE_OTHER): Payer: Self-pay | Admitting: Orthopedic Surgery

## 2017-11-21 DIAGNOSIS — R2689 Other abnormalities of gait and mobility: Secondary | ICD-10-CM | POA: Diagnosis not present

## 2017-11-21 DIAGNOSIS — G8929 Other chronic pain: Secondary | ICD-10-CM | POA: Diagnosis not present

## 2017-11-21 DIAGNOSIS — M25512 Pain in left shoulder: Secondary | ICD-10-CM | POA: Diagnosis not present

## 2017-11-21 DIAGNOSIS — G8921 Chronic pain due to trauma: Secondary | ICD-10-CM | POA: Diagnosis not present

## 2017-11-21 DIAGNOSIS — G894 Chronic pain syndrome: Secondary | ICD-10-CM | POA: Diagnosis not present

## 2017-11-21 DIAGNOSIS — M6281 Muscle weakness (generalized): Secondary | ICD-10-CM | POA: Diagnosis not present

## 2017-11-21 DIAGNOSIS — M545 Low back pain: Secondary | ICD-10-CM | POA: Diagnosis not present

## 2017-11-21 DIAGNOSIS — M25562 Pain in left knee: Secondary | ICD-10-CM | POA: Diagnosis not present

## 2017-11-26 DIAGNOSIS — I70213 Atherosclerosis of native arteries of extremities with intermittent claudication, bilateral legs: Secondary | ICD-10-CM | POA: Diagnosis not present

## 2017-11-27 DIAGNOSIS — M25512 Pain in left shoulder: Secondary | ICD-10-CM | POA: Diagnosis not present

## 2017-11-27 DIAGNOSIS — G894 Chronic pain syndrome: Secondary | ICD-10-CM | POA: Diagnosis not present

## 2017-11-27 DIAGNOSIS — M6281 Muscle weakness (generalized): Secondary | ICD-10-CM | POA: Diagnosis not present

## 2017-11-27 DIAGNOSIS — G8921 Chronic pain due to trauma: Secondary | ICD-10-CM | POA: Diagnosis not present

## 2017-11-27 DIAGNOSIS — M545 Low back pain: Secondary | ICD-10-CM | POA: Diagnosis not present

## 2017-11-27 DIAGNOSIS — M25562 Pain in left knee: Secondary | ICD-10-CM | POA: Diagnosis not present

## 2017-11-27 DIAGNOSIS — R2689 Other abnormalities of gait and mobility: Secondary | ICD-10-CM | POA: Diagnosis not present

## 2017-11-27 DIAGNOSIS — G8929 Other chronic pain: Secondary | ICD-10-CM | POA: Diagnosis not present

## 2017-11-29 DIAGNOSIS — M25512 Pain in left shoulder: Secondary | ICD-10-CM | POA: Diagnosis not present

## 2017-11-29 DIAGNOSIS — M545 Low back pain: Secondary | ICD-10-CM | POA: Diagnosis not present

## 2017-11-29 DIAGNOSIS — M25562 Pain in left knee: Secondary | ICD-10-CM | POA: Diagnosis not present

## 2017-11-29 DIAGNOSIS — R2689 Other abnormalities of gait and mobility: Secondary | ICD-10-CM | POA: Diagnosis not present

## 2017-11-29 DIAGNOSIS — G894 Chronic pain syndrome: Secondary | ICD-10-CM | POA: Diagnosis not present

## 2017-11-29 DIAGNOSIS — G8921 Chronic pain due to trauma: Secondary | ICD-10-CM | POA: Diagnosis not present

## 2017-11-29 DIAGNOSIS — G8929 Other chronic pain: Secondary | ICD-10-CM | POA: Diagnosis not present

## 2017-11-29 DIAGNOSIS — M6281 Muscle weakness (generalized): Secondary | ICD-10-CM | POA: Diagnosis not present

## 2017-11-30 DIAGNOSIS — I10 Essential (primary) hypertension: Secondary | ICD-10-CM | POA: Diagnosis not present

## 2017-11-30 DIAGNOSIS — I70213 Atherosclerosis of native arteries of extremities with intermittent claudication, bilateral legs: Secondary | ICD-10-CM | POA: Diagnosis not present

## 2017-11-30 DIAGNOSIS — E119 Type 2 diabetes mellitus without complications: Secondary | ICD-10-CM | POA: Diagnosis not present

## 2017-11-30 DIAGNOSIS — I25728 Atherosclerosis of autologous artery coronary artery bypass graft(s) with other forms of angina pectoris: Secondary | ICD-10-CM | POA: Diagnosis not present

## 2017-11-30 DIAGNOSIS — E782 Mixed hyperlipidemia: Secondary | ICD-10-CM | POA: Diagnosis not present

## 2017-12-04 DIAGNOSIS — M25562 Pain in left knee: Secondary | ICD-10-CM | POA: Diagnosis not present

## 2017-12-04 DIAGNOSIS — M6281 Muscle weakness (generalized): Secondary | ICD-10-CM | POA: Diagnosis not present

## 2017-12-04 DIAGNOSIS — M25512 Pain in left shoulder: Secondary | ICD-10-CM | POA: Diagnosis not present

## 2017-12-04 DIAGNOSIS — G894 Chronic pain syndrome: Secondary | ICD-10-CM | POA: Diagnosis not present

## 2017-12-04 DIAGNOSIS — G8921 Chronic pain due to trauma: Secondary | ICD-10-CM | POA: Diagnosis not present

## 2017-12-04 DIAGNOSIS — R2689 Other abnormalities of gait and mobility: Secondary | ICD-10-CM | POA: Diagnosis not present

## 2017-12-04 DIAGNOSIS — M545 Low back pain: Secondary | ICD-10-CM | POA: Diagnosis not present

## 2017-12-04 DIAGNOSIS — G8929 Other chronic pain: Secondary | ICD-10-CM | POA: Diagnosis not present

## 2017-12-06 DIAGNOSIS — R2689 Other abnormalities of gait and mobility: Secondary | ICD-10-CM | POA: Diagnosis not present

## 2017-12-06 DIAGNOSIS — G8929 Other chronic pain: Secondary | ICD-10-CM | POA: Diagnosis not present

## 2017-12-06 DIAGNOSIS — G8921 Chronic pain due to trauma: Secondary | ICD-10-CM | POA: Diagnosis not present

## 2017-12-06 DIAGNOSIS — M545 Low back pain: Secondary | ICD-10-CM | POA: Diagnosis not present

## 2017-12-06 DIAGNOSIS — M25512 Pain in left shoulder: Secondary | ICD-10-CM | POA: Diagnosis not present

## 2017-12-06 DIAGNOSIS — G894 Chronic pain syndrome: Secondary | ICD-10-CM | POA: Diagnosis not present

## 2017-12-06 DIAGNOSIS — M25562 Pain in left knee: Secondary | ICD-10-CM | POA: Diagnosis not present

## 2017-12-06 DIAGNOSIS — M6281 Muscle weakness (generalized): Secondary | ICD-10-CM | POA: Diagnosis not present

## 2017-12-11 DIAGNOSIS — M25562 Pain in left knee: Secondary | ICD-10-CM | POA: Diagnosis not present

## 2017-12-11 DIAGNOSIS — R2689 Other abnormalities of gait and mobility: Secondary | ICD-10-CM | POA: Diagnosis not present

## 2017-12-11 DIAGNOSIS — G8929 Other chronic pain: Secondary | ICD-10-CM | POA: Diagnosis not present

## 2017-12-11 DIAGNOSIS — M545 Low back pain: Secondary | ICD-10-CM | POA: Diagnosis not present

## 2017-12-11 DIAGNOSIS — M6281 Muscle weakness (generalized): Secondary | ICD-10-CM | POA: Diagnosis not present

## 2017-12-11 DIAGNOSIS — G894 Chronic pain syndrome: Secondary | ICD-10-CM | POA: Diagnosis not present

## 2017-12-11 DIAGNOSIS — M25512 Pain in left shoulder: Secondary | ICD-10-CM | POA: Diagnosis not present

## 2017-12-11 DIAGNOSIS — G8921 Chronic pain due to trauma: Secondary | ICD-10-CM | POA: Diagnosis not present

## 2017-12-13 DIAGNOSIS — G8921 Chronic pain due to trauma: Secondary | ICD-10-CM | POA: Diagnosis not present

## 2017-12-13 DIAGNOSIS — M545 Low back pain: Secondary | ICD-10-CM | POA: Diagnosis not present

## 2017-12-13 DIAGNOSIS — R2689 Other abnormalities of gait and mobility: Secondary | ICD-10-CM | POA: Diagnosis not present

## 2017-12-13 DIAGNOSIS — G8929 Other chronic pain: Secondary | ICD-10-CM | POA: Diagnosis not present

## 2017-12-13 DIAGNOSIS — M25512 Pain in left shoulder: Secondary | ICD-10-CM | POA: Diagnosis not present

## 2017-12-13 DIAGNOSIS — G894 Chronic pain syndrome: Secondary | ICD-10-CM | POA: Diagnosis not present

## 2017-12-13 DIAGNOSIS — M6281 Muscle weakness (generalized): Secondary | ICD-10-CM | POA: Diagnosis not present

## 2017-12-13 DIAGNOSIS — M25562 Pain in left knee: Secondary | ICD-10-CM | POA: Diagnosis not present

## 2017-12-18 ENCOUNTER — Other Ambulatory Visit: Payer: Self-pay | Admitting: Internal Medicine

## 2017-12-18 DIAGNOSIS — R2689 Other abnormalities of gait and mobility: Secondary | ICD-10-CM | POA: Diagnosis not present

## 2017-12-18 DIAGNOSIS — M25512 Pain in left shoulder: Secondary | ICD-10-CM | POA: Diagnosis not present

## 2017-12-18 DIAGNOSIS — M6281 Muscle weakness (generalized): Secondary | ICD-10-CM | POA: Diagnosis not present

## 2017-12-18 DIAGNOSIS — G8921 Chronic pain due to trauma: Secondary | ICD-10-CM | POA: Diagnosis not present

## 2017-12-18 DIAGNOSIS — G8929 Other chronic pain: Secondary | ICD-10-CM | POA: Diagnosis not present

## 2017-12-18 DIAGNOSIS — G894 Chronic pain syndrome: Secondary | ICD-10-CM | POA: Diagnosis not present

## 2017-12-18 DIAGNOSIS — M545 Low back pain: Secondary | ICD-10-CM | POA: Diagnosis not present

## 2017-12-18 DIAGNOSIS — M25562 Pain in left knee: Secondary | ICD-10-CM | POA: Diagnosis not present

## 2017-12-20 DIAGNOSIS — M25562 Pain in left knee: Secondary | ICD-10-CM | POA: Diagnosis not present

## 2017-12-20 DIAGNOSIS — M25512 Pain in left shoulder: Secondary | ICD-10-CM | POA: Diagnosis not present

## 2017-12-20 DIAGNOSIS — M545 Low back pain: Secondary | ICD-10-CM | POA: Diagnosis not present

## 2017-12-20 DIAGNOSIS — M6281 Muscle weakness (generalized): Secondary | ICD-10-CM | POA: Diagnosis not present

## 2017-12-20 DIAGNOSIS — G8929 Other chronic pain: Secondary | ICD-10-CM | POA: Diagnosis not present

## 2017-12-20 DIAGNOSIS — G8921 Chronic pain due to trauma: Secondary | ICD-10-CM | POA: Diagnosis not present

## 2017-12-20 DIAGNOSIS — R2689 Other abnormalities of gait and mobility: Secondary | ICD-10-CM | POA: Diagnosis not present

## 2017-12-20 DIAGNOSIS — G894 Chronic pain syndrome: Secondary | ICD-10-CM | POA: Diagnosis not present

## 2017-12-25 DIAGNOSIS — G8921 Chronic pain due to trauma: Secondary | ICD-10-CM | POA: Diagnosis not present

## 2017-12-25 DIAGNOSIS — M25562 Pain in left knee: Secondary | ICD-10-CM | POA: Diagnosis not present

## 2017-12-25 DIAGNOSIS — I70213 Atherosclerosis of native arteries of extremities with intermittent claudication, bilateral legs: Secondary | ICD-10-CM | POA: Diagnosis not present

## 2017-12-25 DIAGNOSIS — G894 Chronic pain syndrome: Secondary | ICD-10-CM | POA: Diagnosis not present

## 2017-12-25 DIAGNOSIS — M25512 Pain in left shoulder: Secondary | ICD-10-CM | POA: Diagnosis not present

## 2017-12-25 DIAGNOSIS — R2689 Other abnormalities of gait and mobility: Secondary | ICD-10-CM | POA: Diagnosis not present

## 2017-12-25 DIAGNOSIS — G8929 Other chronic pain: Secondary | ICD-10-CM | POA: Diagnosis not present

## 2017-12-25 DIAGNOSIS — M545 Low back pain: Secondary | ICD-10-CM | POA: Diagnosis not present

## 2017-12-25 DIAGNOSIS — M6281 Muscle weakness (generalized): Secondary | ICD-10-CM | POA: Diagnosis not present

## 2017-12-28 DIAGNOSIS — M6281 Muscle weakness (generalized): Secondary | ICD-10-CM | POA: Diagnosis not present

## 2017-12-28 DIAGNOSIS — G8921 Chronic pain due to trauma: Secondary | ICD-10-CM | POA: Diagnosis not present

## 2017-12-28 DIAGNOSIS — M25562 Pain in left knee: Secondary | ICD-10-CM | POA: Diagnosis not present

## 2017-12-28 DIAGNOSIS — G8929 Other chronic pain: Secondary | ICD-10-CM | POA: Diagnosis not present

## 2017-12-28 DIAGNOSIS — R2689 Other abnormalities of gait and mobility: Secondary | ICD-10-CM | POA: Diagnosis not present

## 2017-12-28 DIAGNOSIS — I70213 Atherosclerosis of native arteries of extremities with intermittent claudication, bilateral legs: Secondary | ICD-10-CM | POA: Diagnosis not present

## 2017-12-28 DIAGNOSIS — G894 Chronic pain syndrome: Secondary | ICD-10-CM | POA: Diagnosis not present

## 2017-12-28 DIAGNOSIS — M25512 Pain in left shoulder: Secondary | ICD-10-CM | POA: Diagnosis not present

## 2017-12-28 DIAGNOSIS — I25728 Atherosclerosis of autologous artery coronary artery bypass graft(s) with other forms of angina pectoris: Secondary | ICD-10-CM | POA: Diagnosis not present

## 2017-12-28 DIAGNOSIS — M545 Low back pain: Secondary | ICD-10-CM | POA: Diagnosis not present

## 2018-01-03 DIAGNOSIS — M545 Low back pain: Secondary | ICD-10-CM | POA: Diagnosis not present

## 2018-01-03 DIAGNOSIS — G8921 Chronic pain due to trauma: Secondary | ICD-10-CM | POA: Diagnosis not present

## 2018-01-03 DIAGNOSIS — G8929 Other chronic pain: Secondary | ICD-10-CM | POA: Diagnosis not present

## 2018-01-03 DIAGNOSIS — R2689 Other abnormalities of gait and mobility: Secondary | ICD-10-CM | POA: Diagnosis not present

## 2018-01-03 DIAGNOSIS — G894 Chronic pain syndrome: Secondary | ICD-10-CM | POA: Diagnosis not present

## 2018-01-03 DIAGNOSIS — M25512 Pain in left shoulder: Secondary | ICD-10-CM | POA: Diagnosis not present

## 2018-01-03 DIAGNOSIS — M25562 Pain in left knee: Secondary | ICD-10-CM | POA: Diagnosis not present

## 2018-01-03 DIAGNOSIS — M6281 Muscle weakness (generalized): Secondary | ICD-10-CM | POA: Diagnosis not present

## 2018-01-08 DIAGNOSIS — G894 Chronic pain syndrome: Secondary | ICD-10-CM | POA: Diagnosis not present

## 2018-01-08 DIAGNOSIS — M25512 Pain in left shoulder: Secondary | ICD-10-CM | POA: Diagnosis not present

## 2018-01-08 DIAGNOSIS — G8921 Chronic pain due to trauma: Secondary | ICD-10-CM | POA: Diagnosis not present

## 2018-01-08 DIAGNOSIS — M25562 Pain in left knee: Secondary | ICD-10-CM | POA: Diagnosis not present

## 2018-01-08 DIAGNOSIS — R2689 Other abnormalities of gait and mobility: Secondary | ICD-10-CM | POA: Diagnosis not present

## 2018-01-08 DIAGNOSIS — G8929 Other chronic pain: Secondary | ICD-10-CM | POA: Diagnosis not present

## 2018-01-08 DIAGNOSIS — M545 Low back pain: Secondary | ICD-10-CM | POA: Diagnosis not present

## 2018-01-08 DIAGNOSIS — M6281 Muscle weakness (generalized): Secondary | ICD-10-CM | POA: Diagnosis not present

## 2018-01-10 DIAGNOSIS — M25562 Pain in left knee: Secondary | ICD-10-CM | POA: Diagnosis not present

## 2018-01-10 DIAGNOSIS — G894 Chronic pain syndrome: Secondary | ICD-10-CM | POA: Diagnosis not present

## 2018-01-10 DIAGNOSIS — G8929 Other chronic pain: Secondary | ICD-10-CM | POA: Diagnosis not present

## 2018-01-10 DIAGNOSIS — M6281 Muscle weakness (generalized): Secondary | ICD-10-CM | POA: Diagnosis not present

## 2018-01-10 DIAGNOSIS — R2689 Other abnormalities of gait and mobility: Secondary | ICD-10-CM | POA: Diagnosis not present

## 2018-01-10 DIAGNOSIS — M545 Low back pain: Secondary | ICD-10-CM | POA: Diagnosis not present

## 2018-01-10 DIAGNOSIS — G8921 Chronic pain due to trauma: Secondary | ICD-10-CM | POA: Diagnosis not present

## 2018-01-10 DIAGNOSIS — M25512 Pain in left shoulder: Secondary | ICD-10-CM | POA: Diagnosis not present

## 2018-01-15 DIAGNOSIS — R2689 Other abnormalities of gait and mobility: Secondary | ICD-10-CM | POA: Diagnosis not present

## 2018-01-15 DIAGNOSIS — M25562 Pain in left knee: Secondary | ICD-10-CM | POA: Diagnosis not present

## 2018-01-15 DIAGNOSIS — M25512 Pain in left shoulder: Secondary | ICD-10-CM | POA: Diagnosis not present

## 2018-01-15 DIAGNOSIS — M545 Low back pain: Secondary | ICD-10-CM | POA: Diagnosis not present

## 2018-01-15 DIAGNOSIS — G894 Chronic pain syndrome: Secondary | ICD-10-CM | POA: Diagnosis not present

## 2018-01-15 DIAGNOSIS — G8929 Other chronic pain: Secondary | ICD-10-CM | POA: Diagnosis not present

## 2018-01-15 DIAGNOSIS — M6281 Muscle weakness (generalized): Secondary | ICD-10-CM | POA: Diagnosis not present

## 2018-01-15 DIAGNOSIS — G8921 Chronic pain due to trauma: Secondary | ICD-10-CM | POA: Diagnosis not present

## 2018-01-17 DIAGNOSIS — G8929 Other chronic pain: Secondary | ICD-10-CM | POA: Diagnosis not present

## 2018-01-17 DIAGNOSIS — M6281 Muscle weakness (generalized): Secondary | ICD-10-CM | POA: Diagnosis not present

## 2018-01-17 DIAGNOSIS — G894 Chronic pain syndrome: Secondary | ICD-10-CM | POA: Diagnosis not present

## 2018-01-17 DIAGNOSIS — M545 Low back pain: Secondary | ICD-10-CM | POA: Diagnosis not present

## 2018-01-17 DIAGNOSIS — R2689 Other abnormalities of gait and mobility: Secondary | ICD-10-CM | POA: Diagnosis not present

## 2018-01-17 DIAGNOSIS — M25512 Pain in left shoulder: Secondary | ICD-10-CM | POA: Diagnosis not present

## 2018-01-17 DIAGNOSIS — G8921 Chronic pain due to trauma: Secondary | ICD-10-CM | POA: Diagnosis not present

## 2018-01-17 DIAGNOSIS — M25562 Pain in left knee: Secondary | ICD-10-CM | POA: Diagnosis not present

## 2018-01-18 ENCOUNTER — Ambulatory Visit (INDEPENDENT_AMBULATORY_CARE_PROVIDER_SITE_OTHER): Payer: Medicare Other | Admitting: Internal Medicine

## 2018-01-18 ENCOUNTER — Encounter: Payer: Self-pay | Admitting: Internal Medicine

## 2018-01-18 VITALS — BP 128/74 | HR 73 | Temp 97.9°F | Resp 16 | Ht 63.0 in

## 2018-01-18 DIAGNOSIS — Z79899 Other long term (current) drug therapy: Secondary | ICD-10-CM | POA: Diagnosis not present

## 2018-01-18 DIAGNOSIS — D649 Anemia, unspecified: Secondary | ICD-10-CM

## 2018-01-18 DIAGNOSIS — E785 Hyperlipidemia, unspecified: Secondary | ICD-10-CM

## 2018-01-18 DIAGNOSIS — E1142 Type 2 diabetes mellitus with diabetic polyneuropathy: Secondary | ICD-10-CM | POA: Diagnosis not present

## 2018-01-18 DIAGNOSIS — I739 Peripheral vascular disease, unspecified: Secondary | ICD-10-CM

## 2018-01-18 DIAGNOSIS — G894 Chronic pain syndrome: Secondary | ICD-10-CM | POA: Diagnosis not present

## 2018-01-18 DIAGNOSIS — I10 Essential (primary) hypertension: Secondary | ICD-10-CM

## 2018-01-18 LAB — COMPREHENSIVE METABOLIC PANEL
ALT: 5 U/L (ref 0–35)
AST: 11 U/L (ref 0–37)
Albumin: 3.8 g/dL (ref 3.5–5.2)
Alkaline Phosphatase: 93 U/L (ref 39–117)
BUN: 19 mg/dL (ref 6–23)
CO2: 30 mEq/L (ref 19–32)
Calcium: 9.2 mg/dL (ref 8.4–10.5)
Chloride: 95 mEq/L — ABNORMAL LOW (ref 96–112)
Creatinine, Ser: 1.37 mg/dL — ABNORMAL HIGH (ref 0.40–1.20)
GFR: 39.37 mL/min — ABNORMAL LOW (ref >60.00)
Glucose, Bld: 133 mg/dL — ABNORMAL HIGH (ref 70–99)
Potassium: 5.3 mEq/L — ABNORMAL HIGH (ref 3.5–5.1)
Sodium: 134 mEq/L — ABNORMAL LOW (ref 135–145)
Total Bilirubin: 0.4 mg/dL (ref 0.2–1.2)
Total Protein: 6.6 g/dL (ref 6.0–8.3)

## 2018-01-18 LAB — CBC WITH DIFFERENTIAL/PLATELET
Basophils Absolute: 0 10*3/uL (ref 0.0–0.1)
Basophils Relative: 0.3 % (ref 0.0–3.0)
Eosinophils Absolute: 0.1 10*3/uL (ref 0.0–0.7)
Eosinophils Relative: 1.7 % (ref 0.0–5.0)
HCT: 31.7 % — ABNORMAL LOW (ref 36.0–46.0)
Hemoglobin: 10.3 g/dL — ABNORMAL LOW (ref 12.0–15.0)
Lymphocytes Relative: 28.4 % (ref 12.0–46.0)
Lymphs Abs: 1.5 10*3/uL (ref 0.7–4.0)
MCHC: 32.5 g/dL (ref 30.0–36.0)
MCV: 83 fl (ref 78.0–100.0)
Monocytes Absolute: 0.4 10*3/uL (ref 0.1–1.0)
Monocytes Relative: 7.2 % (ref 3.0–12.0)
Neutro Abs: 3.3 10*3/uL (ref 1.4–7.7)
Neutrophils Relative %: 62.4 % (ref 43.0–77.0)
Platelets: 181 10*3/uL (ref 150.0–400.0)
RBC: 3.82 Mil/uL — ABNORMAL LOW (ref 3.87–5.11)
RDW: 14.8 % (ref 11.5–15.5)
WBC: 5.2 10*3/uL (ref 4.0–10.5)

## 2018-01-18 LAB — HEMOGLOBIN A1C: Hgb A1c MFr Bld: 6.3 % (ref 4.6–6.5)

## 2018-01-18 LAB — LIPID PANEL
Cholesterol: 112 mg/dL (ref 0–200)
HDL: 38.9 mg/dL — ABNORMAL LOW (ref >39.00)
LDL Cholesterol: 43 mg/dL (ref 0–99)
NonHDL: 73.39
Total CHOL/HDL Ratio: 3
Triglycerides: 151 mg/dL — ABNORMAL HIGH (ref 0.0–149.0)
VLDL: 30.2 mg/dL (ref 0.0–40.0)

## 2018-01-18 MED ORDER — HYDROCODONE-ACETAMINOPHEN 5-325 MG PO TABS: 1.0000 | ORAL_TABLET | Freq: Four times a day (QID) | ORAL | 0 refills | Status: DC | PRN

## 2018-01-18 NOTE — Progress Notes (Signed)
Pre visit review using our clinic review tool, if applicable. No additional management support is needed unless otherwise documented below in the visit note. 

## 2018-01-18 NOTE — Progress Notes (Signed)
Subjective:    Patient ID: [name], female    DOB: 04-20-1935, 83 y.o.   MRN: [id number]  DOS:  01/18/2018 Routine visit, here with her Fredia Beets MSK: I will get a form regards to her prosthetic and the need for new shoes.  Needs a foot exam. Peripheral vascular disease, saw her vascular doctor, aspirin stopped, prescribed Xarelto Pain management: Needs a refill on hydrocodone Osteopenia: Decided to proceed with Prolia    Review of Systems Denies chest pain or difficulty breathing.  Mild edema at the left lower extremity No nausea vomiting.  Occasional diarrhea.  No blood in the stools.  Past Medical History:  Diagnosis Date  . Abnormal echocardiogram    09/2013: mild LVH, mild focal basal hypertrophy of septum, EF 60-65%, normal WM, grade 1 diastolic dysfunction, MAC, mild LAE, ASA, PASP 34. Possible oscillating MV density - reviewed by MD and felt there was no significant abnormality other than MAC noted with mitral valve and did not require TEE.  . Adenomatous colon polyp   . Allergic rhinitis   . Arthritis    "back; ankles; hands; knees" (10/31/2013)  . Bilateral sensorineural hearing loss   . Bladder infection    frequently  . CAD (coronary artery disease)    a. Nonobst in 2011. b. Abnormal nuc 09/2013 -> s/p cutting balloon to D2, mild LAD disease; c. 08/2015 MV: no ischemia, EF 71%.  . Cancer ([place])    cancerous polyps  . Carcinoma in situ in a polyp 1994   a. 1994 - malignant polyp removed during colonoscopy.  . Charcot's Foot    a.  01/2016 s/p RLE transtibial amputation 2/2 Charcot rocker-bottom deformity and insensate neuropathy ulceration.  . Chronic diastolic CHF (congestive heart failure) ([place])    a. 09/2013 Echo: EF 60-65%, mild LVH, Gr1 DD.  Marland Kitchen Diverticulosis   . DJD (degenerative joint disease)   . DVT (deep venous thrombosis) ([place]) 12/2007  . Dyspnea    a. Chronic, extensive w/u see OV note 09-2010. b. [place] 2011: ormal with RA 5 RV 31/2 PA 27/12 (19) PCW  10 CO normal. No evidence of shunting with sitting up in cath lab. c. CP[place] 2011: see report. d. Prior fluoro of diaphragm -  R diaphragm elevated at rest but both moved with inspiration.   . Fatty liver   . GERD (gastroesophageal reflux disease)    a. Hx GERD/esophageal dysmotility followed by Dr. Olevia Perches.   Marland Kitchen Head injury due to trauma   . Headache    Optic migraine  . Heart attack ([place])    2017  . Hemorrhoids   . Hiatal hernia    a. s/p Nissen fundoplication 1856.  Marland Kitchen Hyperlipidemia    a. patient unwilling to use statins.  . Hypertensive heart disease   . IBS (irritable bowel syndrome)   . Macular degeneration 03/2009   Dr. Rosana Hoes  . Macular degeneration   . Myocardial infarction ([place])   . Neuropathy    a. Hands, feet, legs.  . Orthostatic hypotension   . Osteomyelitis ([place])    a. 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 01/2016 s/p RLE transtibial amputation 2/2 Charcot rocker-bottom deformity and insensate neuropathy ulceration.  . Osteoporosis   . PE (pulmonary embolism) 12/2007   a. PE/DVT after neck surgery 2009. b. coumadin d/c 10-2008.  Marland Kitchen Pneumonia 2015ish  . PONV (postoperative nausea and vomiting) 2009   neck surgery  . PPD positive   .  Pulmonary nodule    incidental per CT:  Pet scan 4-9: likely benign, CT 08-2009 no change, no further CTs (Dr. Gwenette Greet)  . Recurrent UTI   . RSD (reflex sympathetic dystrophy)    a. Chronic pain.  Marland Kitchen Spinal stenosis   . Type II diabetes mellitus ([facility])    no on medication  . Venous insufficiency    a. Contributing to LEE.    Past Surgical History:  Procedure Laterality Date  . AMPUTATION  03/06/2012   Procedure: AMPUTATION FOOT;  Surgeon: Newt Minion, MD;  Location: [facility];  Service: Orthopedics;  Laterality: Left;  FIFTH RAY AMPUTATION   . AMPUTATION Right 02/18/2016   Procedure: AMPUTATION BELOW KNEE;  Surgeon: Newt Minion, MD;  Location: [facility];  Service: Orthopedics;   Laterality: Right;  . AMPUTATION Left 11/22/2016   Procedure: Amputation 4th Toe Left Foot at Metatarsophalangeal Joint;  Surgeon: Newt Minion, MD;  Location: [facility];  Service: Orthopedics;  Laterality: Left;  . ANKLE FUSION  09/27/2012   Procedure: ANKLE FUSION;  Surgeon: Newt Minion, MD;  Location: [facility];  Service: Orthopedics;  Laterality: Left;  Left Tibiocalcaneal Fusion  . ANKLE FUSION Right 05/09/2013   Procedure: ANKLE FUSION;  Surgeon: Newt Minion, MD;  Location: [facility];  Service: Orthopedics;  Laterality: Right;  Excision Osteomyelitis Base 1st MT Right Foot, Fusion Medial Column  . ANKLE SURGERY Left apr & june 1991  . ANTERIOR CERVICAL DECOMP/DISCECTOMY FUSION  12/18/07   For OA,  Dr. Lorin Mercy:  fu by a PE  . CARDIAC CATHETERIZATION  05/2010    at [facility]  . CARPAL TUNNEL RELEASE Left 09/2011  . CATARACT EXTRACTION W/ INTRAOCULAR LENS  IMPLANT, BILATERAL  2004   feb 2004 left, aug 2004 right  . CHOLECYSTECTOMY  03/2002  . COLONOSCOPY     numerous times  . CORONARY ANGIOPLASTY  10/31/2013  . CORONARY ARTERY BYPASS GRAFT  09/27/2016  . CORONARY ARTERY BYPASS GRAFT  09/2016   in [place].  . CYST REMOVAL HAND  06/2003  . FOOT BONE EXCISION Right 06/2009  . LAPAROSCOPIC RIGHT HEMI COLECTOMY N/A 01/08/2015   Procedure: LAPAROSCOPIC ASSISTED RIGHT HEMI COLECTOMY;  Surgeon: Johnathan Hausen, MD;  Location: [facility];  Service: General;  Laterality: N/A;  . LEFT HEART CATHETERIZATION WITH CORONARY ANGIOGRAM N/A 10/31/2013   Procedure: LEFT HEART CATHETERIZATION WITH CORONARY ANGIOGRAM;  Surgeon: Jettie Booze, MD;  Location[facility];  Service: Cardiovascular;  Laterality: N/A;  . NASAL SEPTUM SURGERY  09/1963  . NISSEN FUNDOPLICATION  01/23/5955  . PERCUTANEOUS CORONARY INTERVENTION-BALLOON ONLY  10/31/2013   Procedure: PERCUTANEOUS CORONARY INTERVENTION-BALLOON ONLY;  Surgeon: Jettie Booze, MD;  Location: [facility] C[facility] LAB;  Service: Cardiovascular;;  . ROTATOR CUFF REPAIR Right 07/2007    Dr. Percell Miller  . ROTATOR CUFF REPAIR Left 11/2004  . stent in left leg, behind knee  06/27/2017   x2, done at [facility] cardiology in [facility]  . TOE AMPUTATION Right 08/2008   3rd toe, Dr. Sharol Given due to osteomyelitis  . VAGINAL HYSTERECTOMY  03/1975  . VEIN LIGATION Bilateral 03/1966  . VENA CAVA FILTER PLACEMENT  01/2010   green filter; "due to blood clots"  . WISDOM TOOTH EXTRACTION  06/2007    Social History   Socioeconomic History  . Marital status: Divorced    Spouse name: Not on file  . Number of children: 3  . Years of education: 42  . Highest education level: Not  on file  Occupational History  . Occupation: Retired     Comment: from Orth[employer]   Social Needs  . Financial resource strain: Not on file  . Food insecurity:    Worry: Not on file    Inability: Not on file  . Transportation needs:    Medical: Not on file    Non-medical: Not on file  Tobacco Use  . Smoking status: Never Smoker  . Smokeless tobacco: Never Used  . Tobacco comment: tried for a few months in college.   Substance and Sexual Activity  . Alcohol use: No    Alcohol/week: 0.0 oz  . Drug use: No  . Sexual activity: Never    Birth control/protection: Post-menopausal  Lifestyle  . Physical activity:    Days per week: Not on file    Minutes per session: Not on file  . Stress: Not on file  Relationships  . Social connections:    Talks on phone: Not on file    Gets together: Not on file    Attends religious service: Not on file    Active member of club or organization: Not on file    Attends meetings of clubs or organizations: Not on file    Relationship status: Not on file  . Intimate partner violence:    Fear of current or ex partner: Not on file    Emotionally abused: Not on file    Physically abused: Not on file    Forced sexual activity: Not on file  Other Topics Concern  . Not on file  Social History Narrative   Daughter lives w/ her and another daughter lives in town   1 son in  [place]   Son comes home every 4 weeks to visit   Caffeine use:  Tea 1/day w/ dinner   Coffee occass             Allergies as of 01/18/2018      Reactions   Cymbalta [duloxetine Hcl] Swelling   Swelling in legs   Gabapentin Swelling   Swelling in legs Swelling in legs   Silicone Hives, Itching, Dermatitis, Rash   Metformin And Related Other (See Comments)   dizzy, tired, chills, diarrhea, and nausea   Adhesive [tape] Rash   rash rash   Cefazolin Hives   Hives Hives   Ciprofloxacin Other (See Comments)   Other reaction(s): Other (See Comments) Per pt, caused body aches Per pt, caused body aches   Clorazepate Dipotassium Other (See Comments)   Unknown reaction   Darifenacin Hydrobromide Other (See Comments)   hypotension, near syncope   Dilaudid [hydromorphone Hcl] Other (See Comments)    confused, intense itching   Doxycycline Rash   Enablex [darifenacin Hydrobromide Er] Other (See Comments)   Hypotension, near syncope   Levofloxacin Other (See Comments)   Causes wrist pain   Lyrica [pregabalin] Swelling   Swelling in legs   Methadone Hcl Other (See Comments)   Reaction unknown   Morphine And Related Other (See Comments)   Confusion, constipation.   Talwin [pentazocine] Other (See Comments)   "climbing walls" anxiety      Medication List        Accurate as of 01/18/18 11:59 PM. Always use your most recent med list.          Alpha-Lipoic Acid 600 MG Caps Take 600 mg by mouth 2 (two) times daily.   amitriptyline 10 MG tablet Commonly known as:  ELAVIL Take 2 tablets (20 mg  total) by mouth at bedtime.   carvedilol 25 MG tablet Commonly known as:  COREG Take 25 mg by mouth 2 (two) times daily with a meal.   clopidogrel 75 MG tablet Commonly known as:  PLAVIX Take 75 mg by mouth at bedtime.   diclofenac sodium 1 % Gel Commonly known as:  VOLTAREN APPLY 2-4 GRAMS TO AFFECTED AREA 3 TIMES DAILY AS NEEDED   freestyle lancets CHECK BLOOD SUGAR TWICE A  DAY   furosemide 20 MG tablet Commonly known as:  LASIX Take 20 mg by mouth daily.   HYDROcodone-acetaminophen 5-325 MG tablet Commonly known as:  NORCO Take 1 tablet by mouth every 6 (six) hours as needed for moderate pain.   HYDROcodone-acetaminophen 5-325 MG tablet Commonly known as:  NORCO/VICODIN Take 1 tablet by mouth every 6 (six) hours as needed for moderate pain.   hydrocortisone 25 MG suppository Commonly known as:  ANUSOL-HC Place 1 suppository (25 mg total) rectally every 12 (twelve) hours.   lidocaine 5 % Commonly known as:  LIDODERM Place 1 patch onto the skin daily. Remove & Discard patch within 12 hours or as directed by MD   meclizine 25 MG tablet Commonly known as:  ANTIVERT Take 1 tablet (25 mg total) by mouth 2 (two) times daily as needed for dizziness.   nitroGLYCERIN 0.4 MG SL tablet Commonly known as:  NITROSTAT Place 0.4 mg under the tongue every 5 (five) minutes as needed for chest pain (x 3 doses). Reported on 04/14/2016   omeprazole 20 MG capsule Commonly known as:  PRILOSEC TAKE 1 CAPSULE BY MOUTH 2 TIMES DAILY   ondansetron 4 MG disintegrating tablet Commonly known as:  ZOFRAN-ODT TAKE 1 TABLET BY MOUTH EVERY 8 HOURS AS NEEDED FOR NAUSEA OR VOMITING   OVER THE COUNTER MEDICATION 1 tablet 3 (three) times daily. Berberorine Gluco Defense   OVER THE COUNTER MEDICATION Take 1 capsule by mouth 2 (two) times daily. Integrative Digestive Formula   PRESERVISION AREDS 2 Caps Take 1 capsule by mouth 2 (two) times daily.   REFRESH OP Place 1 drop into both eyes 3 (three) times daily as needed (dry eyes).   Vitamin B-12 500 MCG Subl Place 500 mcg under the tongue daily.   Vitamin D3 5000 units Caps Take 5,000 Units by mouth every evening. Reported on 11/24/2015   XARELTO 2.5 MG Tabs tablet Generic drug:  rivaroxaban Take 2.5 mg by mouth 2 (two) times daily.          Objective:   Physical Exam BP 128/74 (BP Location: Right Arm, Patient  Position: Sitting, Cuff Size: Normal)   Pulse 73   Temp 97.9 F (36.6 C) (Oral)   Resp 16   Ht 5' 3"  (1.6 m)   SpO2 98%   BMI 34.80 kg/m  General:   Well developed, well nourished . NAD.  HEENT:  Normocephalic . Face symmetric, atraumatic Lungs:  CTA B Normal respiratory effort, no intercostal retractions, no accessory muscle use. Heart: RRR,  no murmur.  No pretibial edema bilaterally  MSK: Status post right BKA. Feet exam: Left foot with no sensitivity, well-healed surgical scars, there is some redness at the top of the great toe but the area is not warmness, no fluctuance.  See pictures. Neurologic:  alert & oriented X3.  Speech normal  Psych--  Cognition and judgment appear intact.  Cooperative with normal attention span and concentration.  Behavior appropriate. No anxious or depressed appearing.  Assessment & Plan:    Assessment DM, Neuropathy, amputations due to Charcot feet. - metformin intolerant  (lethargic) HTN  Hyperlipidemia-- won't take statins  LUNG MASS -incidental per CT 2009,  PET scan 01-2008: likely benign, CT 08-2009 no change, no further CTs per Dr Gwenette Greet - Had a CT in [place] 09-2016 "spiculated mass", saw Dr Lamonte Sakai,  CT  09/2017 stable CV: ---CAD sees Dr Meda Coffee and a cardiologist in [facility] --NSTEMI 09-2016. Outside hospital: Cath 2 vessel disease, CABG 2, LIMA to LAD and Diag   ---Chronic diastolic CHF ---PVD: Aortogram and a stent L Leg Dr Feliberto Gottron, 06-27-2017 @ [facility] (per pt) GI: GERD, IBS, colon polyps, PUD, abdominal pain "post polypectomy syndrome" naueas meds prn (antivert-zofran, gets nausea when car traveling) Osteoporosis  MSK,pain mngmt : --Reflex sympathetic dystrophy --Neuropathy --DJD --Spinal stenosis  --H/o Osteomyelitis  Foot --s/p amputation:  4th 5th L toes , R BKA  (01-2016, dx osteomyelitis) w/ phantom pain  -- sees Dr Sharol Given prn Takes nortriptyline also for leg cramps. She had local injections with  Dr. Maia Petties 2016 w/o apparent help. She took oxycodone after surgery and that did NOT seem to help better than hydrocodone. Intolerant to Cymbalta, Neurontin ; Lyrica helped but caused edema. Recurrent UTIs -- bactrim q d Venous insufficiency  H/o  + PPD H/o hypothyroidism: TSHs stable H/o dyspnea  PLAN:  DM: Check a A1c.  Currently diet controlled PVD, CAD: Saw Dr. Feliberto Gottron, vascular med, at [facility] phone [phone].  He rec to stop aspirin and started Xarelto. Based on that I strongly recommend to avoid NSAIDs and Relafen. RSD, neuropathy, DJD: As above, will stop NSAIDs, refill hydrocodone.  UDS today. Osteopenia: Elected to start Prolia, will start the process of getting it. Hyperlipidemia: Check a FLP Anemia per chart review: Check a CBC DJD, MSK: Foot exam done today, I will fill the forms for her new shoes. RTC 3-4 months

## 2018-01-18 NOTE — Patient Instructions (Signed)
GO TO THE LAB : Get the blood work     GO TO THE FRONT DESK Schedule your next appointment for a checkup in 3 or 4 months  

## 2018-01-19 NOTE — Assessment & Plan Note (Signed)
DM: Check a A1c.  Currently diet controlled PVD, CAD: Saw Dr. Feliberto Gottron, vascular med, at [facility] phone [phone].  He rec to stop aspirin and started Xarelto. Based on that I strongly recommend to avoid NSAIDs and Relafen. RSD, neuropathy, DJD: As above, will stop NSAIDs, refill hydrocodone.  UDS today. Osteopenia: Elected to start Prolia, will start the process of getting it. Hyperlipidemia: Check a FLP Anemia per chart review: Check a CBC DJD, MSK: Foot exam done today, I will fill the forms for her new shoes. RTC 3-4 months

## 2018-01-21 ENCOUNTER — Other Ambulatory Visit (INDEPENDENT_AMBULATORY_CARE_PROVIDER_SITE_OTHER): Payer: Medicare Other

## 2018-01-21 ENCOUNTER — Other Ambulatory Visit: Payer: Self-pay

## 2018-01-21 ENCOUNTER — Telehealth: Payer: Self-pay | Admitting: Internal Medicine

## 2018-01-21 DIAGNOSIS — D508 Other iron deficiency anemias: Secondary | ICD-10-CM | POA: Diagnosis not present

## 2018-01-21 DIAGNOSIS — E875 Hyperkalemia: Secondary | ICD-10-CM

## 2018-01-21 LAB — PAIN MGMT, PROFILE 8 W/CONF, U
6 Acetylmorphine: NEGATIVE ng/mL (ref <10)
Alcohol Metabolites: NEGATIVE ng/mL (ref <500)
Alphahydroxyalprazolam: NEGATIVE ng/mL (ref <25)
Alphahydroxymidazolam: NEGATIVE ng/mL (ref <50)
Alphahydroxytriazolam: NEGATIVE ng/mL (ref <50)
Aminoclonazepam: NEGATIVE ng/mL (ref <25)
Amphetamines: NEGATIVE ng/mL (ref <500)
Benzodiazepines: NEGATIVE ng/mL (ref <100)
Buprenorphine, Urine: NEGATIVE ng/mL (ref <5)
Cocaine Metabolite: NEGATIVE ng/mL (ref <150)
Codeine: NEGATIVE ng/mL (ref <50)
Creatinine: 72.9 mg/dL
Hydrocodone: 1971 ng/mL — ABNORMAL HIGH (ref <50)
Hydromorphone: 89 ng/mL — ABNORMAL HIGH (ref <50)
Hydroxyethylflurazepam: NEGATIVE ng/mL (ref <50)
Lorazepam: NEGATIVE ng/mL (ref <50)
MDMA: NEGATIVE ng/mL (ref <500)
Marijuana Metabolite: NEGATIVE ng/mL (ref <20)
Morphine: NEGATIVE ng/mL (ref <50)
Nordiazepam: NEGATIVE ng/mL (ref <50)
Norhydrocodone: 1332 ng/mL — ABNORMAL HIGH (ref <50)
Opiates: POSITIVE ng/mL — AB (ref <100)
Oxazepam: NEGATIVE ng/mL (ref <50)
Oxidant: NEGATIVE ug/mL (ref <200)
Oxycodone: NEGATIVE ng/mL (ref <100)
Temazepam: NEGATIVE ng/mL (ref <50)
pH: 6.11 (ref 4.5–9.0)

## 2018-01-21 LAB — BASIC METABOLIC PANEL
BUN: 17 mg/dL (ref 6–23)
CO2: 26 mEq/L (ref 19–32)
Calcium: 8.9 mg/dL (ref 8.4–10.5)
Chloride: 97 mEq/L (ref 96–112)
Creatinine, Ser: 1.15 mg/dL (ref 0.40–1.20)
GFR: 48.18 mL/min — ABNORMAL LOW (ref >60.00)
Glucose, Bld: 139 mg/dL — ABNORMAL HIGH (ref 70–99)
Potassium: 4.5 mEq/L (ref 3.5–5.1)
Sodium: 132 mEq/L — ABNORMAL LOW (ref 135–145)

## 2018-01-21 LAB — IRON: Iron: 31 ug/dL — ABNORMAL LOW (ref 42–145)

## 2018-01-21 LAB — FERRITIN: Ferritin: 27.7 ng/mL (ref 10.0–291.0)

## 2018-01-21 NOTE — Telephone Encounter (Signed)
Copied from [place]. Topic: Quick Communication - See Telephone Encounter >> Jan 21, 2018 10:00 AM Hewitt Shorts wrote: CRM for notification. See Telephone encounter for: 01/21/18. Pt states that she had lab work done on Friday and was called back to say that labs needed to be redone and patient is wanting to know if the labs can be put in as an order and she go somewhere in [place] that the office recommends to have it done so she does not have to have it done in [place] number [phone]

## 2018-01-21 NOTE — Telephone Encounter (Signed)
Tried calling Pt- telephone line busy. Will try again later.

## 2018-01-21 NOTE — Telephone Encounter (Signed)
Spoke w/ Pt- she is in [place]- informed she could go by our [place] location for labs- no appt necessary. BMP ordered.

## 2018-01-22 DIAGNOSIS — M545 Low back pain: Secondary | ICD-10-CM | POA: Diagnosis not present

## 2018-01-22 DIAGNOSIS — G8929 Other chronic pain: Secondary | ICD-10-CM | POA: Diagnosis not present

## 2018-01-22 DIAGNOSIS — R2689 Other abnormalities of gait and mobility: Secondary | ICD-10-CM | POA: Diagnosis not present

## 2018-01-22 DIAGNOSIS — G894 Chronic pain syndrome: Secondary | ICD-10-CM | POA: Diagnosis not present

## 2018-01-22 DIAGNOSIS — M25562 Pain in left knee: Secondary | ICD-10-CM | POA: Diagnosis not present

## 2018-01-22 DIAGNOSIS — G8921 Chronic pain due to trauma: Secondary | ICD-10-CM | POA: Diagnosis not present

## 2018-01-22 DIAGNOSIS — M25512 Pain in left shoulder: Secondary | ICD-10-CM | POA: Diagnosis not present

## 2018-01-22 DIAGNOSIS — M6281 Muscle weakness (generalized): Secondary | ICD-10-CM | POA: Diagnosis not present

## 2018-01-24 ENCOUNTER — Telehealth: Payer: Self-pay

## 2018-01-24 DIAGNOSIS — M6281 Muscle weakness (generalized): Secondary | ICD-10-CM | POA: Diagnosis not present

## 2018-01-24 DIAGNOSIS — M25562 Pain in left knee: Secondary | ICD-10-CM | POA: Diagnosis not present

## 2018-01-24 DIAGNOSIS — R2689 Other abnormalities of gait and mobility: Secondary | ICD-10-CM | POA: Diagnosis not present

## 2018-01-24 DIAGNOSIS — G8921 Chronic pain due to trauma: Secondary | ICD-10-CM | POA: Diagnosis not present

## 2018-01-24 DIAGNOSIS — G894 Chronic pain syndrome: Secondary | ICD-10-CM | POA: Diagnosis not present

## 2018-01-24 DIAGNOSIS — M25512 Pain in left shoulder: Secondary | ICD-10-CM | POA: Diagnosis not present

## 2018-01-24 DIAGNOSIS — M545 Low back pain: Secondary | ICD-10-CM | POA: Diagnosis not present

## 2018-01-24 DIAGNOSIS — G8929 Other chronic pain: Secondary | ICD-10-CM | POA: Diagnosis not present

## 2018-01-24 NOTE — Telephone Encounter (Signed)
Summary of benefits initiated- forwarded to [place].

## 2018-01-25 NOTE — Telephone Encounter (Signed)
Prolia benefits received °PA not required °$185 copay met °Tricare will cover 20% con-insurance ° ° ° °Patient may owe approximately $0 OOP ° ° °Letter mailed to inform patient of benefits and to schedule °  °

## 2018-01-29 DIAGNOSIS — M545 Low back pain: Secondary | ICD-10-CM | POA: Diagnosis not present

## 2018-01-29 DIAGNOSIS — M6281 Muscle weakness (generalized): Secondary | ICD-10-CM | POA: Diagnosis not present

## 2018-01-29 DIAGNOSIS — M25512 Pain in left shoulder: Secondary | ICD-10-CM | POA: Diagnosis not present

## 2018-01-29 DIAGNOSIS — G8929 Other chronic pain: Secondary | ICD-10-CM | POA: Diagnosis not present

## 2018-01-29 DIAGNOSIS — R2689 Other abnormalities of gait and mobility: Secondary | ICD-10-CM | POA: Diagnosis not present

## 2018-01-29 DIAGNOSIS — G8921 Chronic pain due to trauma: Secondary | ICD-10-CM | POA: Diagnosis not present

## 2018-01-29 DIAGNOSIS — M25562 Pain in left knee: Secondary | ICD-10-CM | POA: Diagnosis not present

## 2018-01-29 DIAGNOSIS — G894 Chronic pain syndrome: Secondary | ICD-10-CM | POA: Diagnosis not present

## 2018-02-04 ENCOUNTER — Other Ambulatory Visit: Payer: Self-pay | Admitting: Internal Medicine

## 2018-02-04 NOTE — Telephone Encounter (Signed)
Copied from [place] [id number]. Topic: Quick Communication - Rx Refill/Question >> Feb 04, 2018  3:53 PM Boyd Kerbs wrote:  Medication: HYDROcodone-acetaminophen (NORCO/VICODIN) 5-325 MG tablet  Pt. Is out of medication  Has the patient contacted their pharmacy? Yes.   (Agent: If no, request that the patient contact the pharmacy for the refill.) Preferred Pharmacy (with phone number or street name):   [facility] [place], [place] - [address] AT [address] [place] [address] Phone: [phone] Fax: [phone]   Agent: Please be advised that RX refills may take up to 3 business days. We ask that you follow-up with your pharmacy.

## 2018-02-05 DIAGNOSIS — M545 Low back pain: Secondary | ICD-10-CM | POA: Diagnosis not present

## 2018-02-05 DIAGNOSIS — M6281 Muscle weakness (generalized): Secondary | ICD-10-CM | POA: Diagnosis not present

## 2018-02-05 DIAGNOSIS — R2689 Other abnormalities of gait and mobility: Secondary | ICD-10-CM | POA: Diagnosis not present

## 2018-02-05 DIAGNOSIS — G8929 Other chronic pain: Secondary | ICD-10-CM | POA: Diagnosis not present

## 2018-02-05 DIAGNOSIS — M25562 Pain in left knee: Secondary | ICD-10-CM | POA: Diagnosis not present

## 2018-02-05 DIAGNOSIS — G8921 Chronic pain due to trauma: Secondary | ICD-10-CM | POA: Diagnosis not present

## 2018-02-05 DIAGNOSIS — G894 Chronic pain syndrome: Secondary | ICD-10-CM | POA: Diagnosis not present

## 2018-02-05 DIAGNOSIS — M25512 Pain in left shoulder: Secondary | ICD-10-CM | POA: Diagnosis not present

## 2018-02-05 NOTE — Telephone Encounter (Signed)
Hydrocodone-acetqaminophen refill Last OV: 01/18/18 Last Refill:01/18/18 #120 tab No RF Pharmacy:[facility] [address] PCP: Dr Larose Kells

## 2018-02-05 NOTE — Telephone Encounter (Signed)
Refill request too soon. Last refilled on 01/18/2018, she should have enough for 30 days if taken as prescribed.

## 2018-02-07 ENCOUNTER — Telehealth: Payer: Self-pay | Admitting: Internal Medicine

## 2018-02-07 ENCOUNTER — Telehealth: Payer: Self-pay

## 2018-02-07 MED ORDER — HYDROCODONE-ACETAMINOPHEN 5-325 MG PO TABS: 1.0000 | ORAL_TABLET | Freq: Four times a day (QID) | ORAL | 0 refills | Status: DC | PRN

## 2018-02-07 NOTE — Telephone Encounter (Signed)
Copied from [place]. Topic: Quick Communication - See Telephone Encounter >> Feb 07, 2018  1:56 PM Ether Griffins B wrote: CRM for notification. See Telephone encounter for: 02/07/18.  Pt inquiring about Diabetic shoes order. I checked with the office and they have not received it yet. I advised pt to check back with the other office and have them resend the request.

## 2018-02-07 NOTE — Telephone Encounter (Signed)
Pt needing refill on Norco 5-325mg  sent to [facility] in [place], [place] which used to [facility]- per chart- refill was sent on 01/18/2018 #120 and 0rf, however because [facility] changed to [facility] they were unable to keep prescription and needing new one. [facility] verified- last Norco prescription picked up on 01/04/2018. I also called down to [facility] in [place], [place]- they verified this information.

## 2018-02-07 NOTE — Telephone Encounter (Signed)
sent 

## 2018-02-08 DIAGNOSIS — R2689 Other abnormalities of gait and mobility: Secondary | ICD-10-CM | POA: Diagnosis not present

## 2018-02-08 DIAGNOSIS — M6281 Muscle weakness (generalized): Secondary | ICD-10-CM | POA: Diagnosis not present

## 2018-02-08 DIAGNOSIS — M545 Low back pain: Secondary | ICD-10-CM | POA: Diagnosis not present

## 2018-02-08 DIAGNOSIS — G894 Chronic pain syndrome: Secondary | ICD-10-CM | POA: Diagnosis not present

## 2018-02-08 DIAGNOSIS — G8921 Chronic pain due to trauma: Secondary | ICD-10-CM | POA: Diagnosis not present

## 2018-02-08 DIAGNOSIS — G8929 Other chronic pain: Secondary | ICD-10-CM | POA: Diagnosis not present

## 2018-02-08 DIAGNOSIS — M25512 Pain in left shoulder: Secondary | ICD-10-CM | POA: Diagnosis not present

## 2018-02-08 DIAGNOSIS — M25562 Pain in left knee: Secondary | ICD-10-CM | POA: Diagnosis not present

## 2018-02-12 ENCOUNTER — Telehealth: Payer: Self-pay | Admitting: Gastroenterology

## 2018-02-12 DIAGNOSIS — G8921 Chronic pain due to trauma: Secondary | ICD-10-CM | POA: Diagnosis not present

## 2018-02-12 DIAGNOSIS — G894 Chronic pain syndrome: Secondary | ICD-10-CM | POA: Diagnosis not present

## 2018-02-12 DIAGNOSIS — M545 Low back pain: Secondary | ICD-10-CM | POA: Diagnosis not present

## 2018-02-12 DIAGNOSIS — M25512 Pain in left shoulder: Secondary | ICD-10-CM | POA: Diagnosis not present

## 2018-02-12 DIAGNOSIS — M6281 Muscle weakness (generalized): Secondary | ICD-10-CM | POA: Diagnosis not present

## 2018-02-12 DIAGNOSIS — R2689 Other abnormalities of gait and mobility: Secondary | ICD-10-CM | POA: Diagnosis not present

## 2018-02-12 DIAGNOSIS — G8929 Other chronic pain: Secondary | ICD-10-CM | POA: Diagnosis not present

## 2018-02-12 DIAGNOSIS — M25562 Pain in left knee: Secondary | ICD-10-CM | POA: Diagnosis not present

## 2018-02-12 NOTE — Telephone Encounter (Signed)
Spoke with the patient's daughter. The patient has had 3 episodes of blood and diarrhea. Blood is red. No black stools. Complains of discomfort in the LLQ. Patient is on blood thinner Plavix or Xarelto. She is with her son in [place]. Coming home this week. Appointment scheduled. If she acutely worsens she is to go to the ER where she is.

## 2018-02-13 ENCOUNTER — Ambulatory Visit: Payer: Self-pay | Admitting: *Deleted

## 2018-02-13 ENCOUNTER — Other Ambulatory Visit: Payer: Self-pay | Admitting: Gastroenterology

## 2018-02-13 NOTE — Telephone Encounter (Signed)
Called in because she was instructed by her questionably daughter to call Dr. Larose Kells due to her rectal bleeding.   Her daughter has been in touch with Dr. Larose Kells via [place] and since the pt is in [facility] visiting her son she has been instructed to go to the ED there if the bleeding reoccurs which she already knew.  It was determined she had already gotten the latest message from Dr. Larose Kells.  She called from [place] where she is visiting her son.    I got some basic information from her regarding the rectal bleeding.   It happened Saturday night and twice the next day (Sunday).   It has not happened since Sunday.    She denies feeling dizzy, weak or any further bleeding.    I let her know to go to the ED if the bleeding resumes per Dr. Ethel Rana  instructions.  I let her know to call us back if she needed anything.    She verbalized understanding.  She is returning home Thursday.   She has an appt with the GI doctor on Friday.     Reason for Disposition . Age > 50 years  Answer Assessment - Initial Assessment Questions 1. APPEARANCE of BLOOD: "What color is it?" "Is it passed separately, on the surface of the stool, or mixed in with the stool?"      Saturday night went to bathroom got blood on the tissue and blood in toilet.  Had 2 more episodes the next day.    Then it quit.    2. AMOUNT: "How much blood was passed?"      I'm not bleeding now.   Sunday  was the last time.    3. FREQUENCY: "How many times has blood been passed with the stools?"        4. ONSET: "When was the blood first seen in the stools?" (Days or weeks)      Saturday night and twice Sunday (the next day)  5. DIARRHEA: "Is there also some diarrhea?" If so, ask: "How many diarrhea stools were passed in past 24 hours?"        6. CONSTIPATION: "Do you have constipation?" If so, "How bad is it?"     *No Answer* 7. RECURRENT SYMPTOMS: "Have you had blood in your stools before?" If so, ask: "When was the last time?" and "What happened that  time?"     She is no longer bleeding since the episode Saturday and twice the next day.    Her daughter has been in touch with Dr. Larose Kells via [place] and the last message was to go to the ED there in [facility] where you are visiting your son if you start bleeding again. 8. BLOOD THINNERS: "Do you take any blood thinners?" (e.g., Coumadin/warfarin, Pradaxa/dabigatran, aspirin)     Yes 9. OTHER SYMPTOMS: "Do you have any other symptoms?"  (e.g., abdominal pain, vomiting, dizziness, fever)     Denies any of the above symptoms 10. PREGNANCY: "Is there any chance you are pregnant?" "When was your last menstrual period?"       Not asked due to age  Protocols used: RECTAL BLEEDING-A-AH

## 2018-02-13 NOTE — Telephone Encounter (Signed)
See GI phone note, will see them in a couple of days.

## 2018-02-13 NOTE — Telephone Encounter (Signed)
FYI

## 2018-02-13 NOTE — Telephone Encounter (Addendum)
See GI phone note, will see them in a couple of days.

## 2018-02-14 ENCOUNTER — Other Ambulatory Visit: Payer: Self-pay | Admitting: Internal Medicine

## 2018-02-14 DIAGNOSIS — G894 Chronic pain syndrome: Secondary | ICD-10-CM | POA: Diagnosis not present

## 2018-02-14 DIAGNOSIS — M545 Low back pain: Secondary | ICD-10-CM | POA: Diagnosis not present

## 2018-02-14 DIAGNOSIS — M25512 Pain in left shoulder: Secondary | ICD-10-CM | POA: Diagnosis not present

## 2018-02-14 DIAGNOSIS — G8929 Other chronic pain: Secondary | ICD-10-CM | POA: Diagnosis not present

## 2018-02-14 DIAGNOSIS — R2689 Other abnormalities of gait and mobility: Secondary | ICD-10-CM | POA: Diagnosis not present

## 2018-02-14 DIAGNOSIS — M6281 Muscle weakness (generalized): Secondary | ICD-10-CM | POA: Diagnosis not present

## 2018-02-14 DIAGNOSIS — M25562 Pain in left knee: Secondary | ICD-10-CM | POA: Diagnosis not present

## 2018-02-14 DIAGNOSIS — G8921 Chronic pain due to trauma: Secondary | ICD-10-CM | POA: Diagnosis not present

## 2018-02-15 ENCOUNTER — Encounter: Payer: Self-pay | Admitting: Gastroenterology

## 2018-02-15 ENCOUNTER — Telehealth: Payer: Self-pay

## 2018-02-15 ENCOUNTER — Other Ambulatory Visit (INDEPENDENT_AMBULATORY_CARE_PROVIDER_SITE_OTHER): Payer: Medicare Other

## 2018-02-15 ENCOUNTER — Ambulatory Visit (INDEPENDENT_AMBULATORY_CARE_PROVIDER_SITE_OTHER): Payer: Medicare Other | Admitting: Gastroenterology

## 2018-02-15 VITALS — BP 118/68 | HR 68 | Ht 63.0 in

## 2018-02-15 DIAGNOSIS — K644 Residual hemorrhoidal skin tags: Secondary | ICD-10-CM

## 2018-02-15 DIAGNOSIS — K625 Hemorrhage of anus and rectum: Secondary | ICD-10-CM

## 2018-02-15 LAB — CBC WITH DIFFERENTIAL/PLATELET
Basophils Absolute: 0.1 10*3/uL (ref 0.0–0.1)
Basophils Relative: 1.5 % (ref 0.0–3.0)
Eosinophils Absolute: 0.1 10*3/uL (ref 0.0–0.7)
Eosinophils Relative: 2.1 % (ref 0.0–5.0)
HCT: 32 % — ABNORMAL LOW (ref 36.0–46.0)
Hemoglobin: 10.5 g/dL — ABNORMAL LOW (ref 12.0–15.0)
Lymphocytes Relative: 36.5 % (ref 12.0–46.0)
Lymphs Abs: 1.6 10*3/uL (ref 0.7–4.0)
MCHC: 32.7 g/dL (ref 30.0–36.0)
MCV: 80.7 fl (ref 78.0–100.0)
Monocytes Absolute: 0.4 10*3/uL (ref 0.1–1.0)
Monocytes Relative: 8 % (ref 3.0–12.0)
Neutro Abs: 2.3 10*3/uL (ref 1.4–7.7)
Neutrophils Relative %: 51.9 % (ref 43.0–77.0)
Platelets: 207 10*3/uL (ref 150.0–400.0)
RBC: 3.97 Mil/uL (ref 3.87–5.11)
RDW: 14.9 % (ref 11.5–15.5)
WBC: 4.5 10*3/uL (ref 4.0–10.5)

## 2018-02-15 MED ORDER — HYDROCORTISONE ACETATE 25 MG RE SUPP: 25.0000 mg | Freq: Two times a day (BID) | RECTAL | 1 refills | Status: DC

## 2018-02-15 MED ORDER — HYDROCORTISONE 2.5 % RE CREA: 1.0000 "application " | TOPICAL_CREAM | Freq: Two times a day (BID) | RECTAL | 1 refills | Status: DC

## 2018-02-15 NOTE — Telephone Encounter (Signed)
Pt has been notified her CBC is stable per J.Zher PA.

## 2018-02-15 NOTE — Progress Notes (Signed)
02/15/2018 [name] [id number] [birth year]   HISTORY OF PRESENT ILLNESS:  This is an 83 year old female who is known to Dr. Silverio Decamp.  Her last colonoscopy was in March 2017 at which time she was found to have both internal and external hemorrhoids as well as diverticulosis and some polyps that were removed.  She presents today with complaints of rectal bleeding.  She tells me that this bleeding occurred last Saturday, Sunday, and Monday.  She said Saturday the toilet was full of blood after bowel movement.  This happened again on Sunday and Monday.  She has not seen any more blood since Monday.  She is on Plavix and Xarelto.    Past Medical History:  Diagnosis Date  . Abnormal echocardiogram    09/2013: mild LVH, mild focal basal hypertrophy of septum, EF 60-65%, normal WM, grade 1 diastolic dysfunction, MAC, mild LAE, ASA, PASP 34. Possible oscillating MV density - reviewed by MD and felt there was no significant abnormality other than MAC noted with mitral valve and did not require TEE.  . Adenomatous colon polyp   . Allergic rhinitis   . Arthritis    "back; ankles; hands; knees" (10/31/2013)  . Bilateral sensorineural hearing loss   . Bladder infection    frequently  . CAD (coronary artery disease)    a. Nonobst in 2011. b. Abnormal nuc 09/2013 -> s/p cutting balloon to D2, mild LAD disease; c. 08/2015 MV: no ischemia, EF 71%.  . Cancer ([place])    cancerous polyps  . Carcinoma in situ in a polyp 1994   a. 1994 - malignant polyp removed during colonoscopy.  . Charcot's Foot    a.  01/2016 s/p RLE transtibial amputation 2/2 Charcot rocker-bottom deformity and insensate neuropathy ulceration.  . Chronic diastolic CHF (congestive heart failure) ([place])    a. 09/2013 Echo: EF 60-65%, mild LVH, Gr1 DD.  Marland Kitchen Diverticulosis   . DJD (degenerative joint disease)   . DVT (deep venous thrombosis) ([place]) 12/2007  . Dyspnea    a. Chronic, extensive w/u see OV note 09-2010. b. [place] 2011: ormal  with RA 5 RV 31/2 PA 27/12 (19) PCW 10 CO normal. No evidence of shunting with sitting up in cath lab. c. CPX 2011: see report. d. Prior fluoro of diaphragm -  R diaphragm elevated at rest but both moved with inspiration.   . Fatty liver   . GERD (gastroesophageal reflux disease)    a. Hx GERD/esophageal dysmotility followed by Dr. Olevia Perches.   Marland Kitchen Head injury due to trauma   . Headache    Optic migraine  . Heart attack ([place])    2017  . Hemorrhoids   . Hiatal hernia    a. s/p Nissen fundoplication 3235.  Marland Kitchen Hyperlipidemia    a. patient unwilling to use statins.  . Hypertensive heart disease   . IBS (irritable bowel syndrome)   . Macular degeneration 03/2009   Dr. Rosana Hoes  . Macular degeneration   . Myocardial infarction ([place])   . Neuropathy    a. Hands, feet, legs.  . Orthostatic hypotension   . Osteomyelitis ([place])    a. 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 01/2016 s/p RLE transtibial amputation 2/2 Charcot rocker-bottom deformity and insensate neuropathy ulceration.  . Osteoporosis   . PE (pulmonary embolism) 12/2007   a. PE/DVT after neck surgery 2009. b. coumadin d/c 10-2008.  Marland Kitchen Pneumonia 2015ish  . PONV (postoperative nausea and  vomiting) 2009   neck surgery  . PPD positive   . Pulmonary nodule    incidental per CT:  Pet scan 4-9: likely benign, CT 08-2009 no change, no further CTs (Dr. Gwenette Greet)  . Recurrent UTI   . RSD (reflex sympathetic dystrophy)    a. Chronic pain.  Marland Kitchen Spinal stenosis   . Type II diabetes mellitus (H[facility])    no on medication  . Venous insufficiency    a. Contributing to LEE.   Past Surgical History:  Procedure Laterality Date  . AMPUTATION  03/06/2012   Procedure: AMPUTATION FOOT;  Surgeon: Newt Minion, MD;  Location: [facility];  Service: Orthopedics;  Laterality: Left;  FIFTH RAY AMPUTATION   . AMPUTATION Right 02/18/2016   Procedure: AMPUTATION BELOW KNEE;  Surgeon: Newt Minion, MD;  Location: [facility];  Service: Orthopedics;  Laterality: Right;  . AMPUTATION Left 11/22/2016   Procedure: Amputation 4th Toe Left Foot at Metatarsophalangeal Joint;  Surgeon: Newt Minion, MD;  Location: [facility];  Service: Orthopedics;  Laterality: Left;  . ANKLE FUSION  09/27/2012   Procedure: ANKLE FUSION;  Surgeon: Newt Minion, MD;  Location: [facility];  Service: Orthopedics;  Laterality: Left;  Left Tibiocalcaneal Fusion  . ANKLE FUSION Right 05/09/2013   Procedure: ANKLE FUSION;  Surgeon: Newt Minion, MD;  Location: [facility];  Service: Orthopedics;  Laterality: Right;  Excision Osteomyelitis Base 1st MT Right Foot, Fusion Medial Column  . ANKLE SURGERY Left apr & june 1991  . ANTERIOR CERVICAL DECOMP/DISCECTOMY FUSION  12/18/07   For OA,  Dr. Lorin Mercy:  fu by a PE  . CARDIAC CATHETERIZATION  05/2010    at [facility]  . CARPAL TUNNEL RELEASE Left 09/2011  . CATARACT EXTRACTION W/ INTRAOCULAR LENS  IMPLANT, BILATERAL  2004   feb 2004 left, aug 2004 right  . CHOLECYSTECTOMY  03/2002  . COLONOSCOPY     numerous times  . CORONARY ANGIOPLASTY  10/31/2013  . CORONARY ARTERY BYPASS GRAFT  09/27/2016  . CORONARY ARTERY BYPASS GRAFT  09/2016   in [place].  . CYST REMOVAL HAND  06/2003  . FOOT BONE EXCISION Right 06/2009  . LAPAROSCOPIC RIGHT HEMI COLECTOMY N/A 01/08/2015   Procedure: LAPAROSCOPIC ASSISTED RIGHT HEMI COLECTOMY;  Surgeon: Johnathan Hausen, MD;  Location: [facility];  Service: General;  Laterality: N/A;  . LEFT HEART CATHETERIZATION WITH CORONARY ANGIOGRAM N/A 10/31/2013   Procedure: LEFT HEART CATHETERIZATION WITH CORONARY ANGIOGRAM;  Surgeon: Jettie Booze, MD;  Location[facility];  Service: Cardiovascular;  Laterality: N/A;  . NASAL SEPTUM SURGERY  09/1963  . NISSEN FUNDOPLICATION  0/12/4740  . PERCUTANEOUS CORONARY INTERVENTION-BALLOON ONLY  10/31/2013   Procedure: PERCUTANEOUS CORONARY INTERVENTION-BALLOON ONLY;  Surgeon: Jettie Booze, MD;  Location: [facility] CATH LAB;  Service: Cardiovascular;;  . ROTATOR  CUFF REPAIR Right 07/2007   Dr. Percell Miller  . ROTATOR CUFF REPAIR Left 11/2004  . stent in left leg, behind knee  06/27/2017   x2, done at [facility] in [facility]  . TOE AMPUTATION Right 08/2008   3rd toe, Dr. Sharol Given due to osteomyelitis  . VAGINAL HYSTERECTOMY  03/1975  . VEIN LIGATION Bilateral 03/1966  . VENA CAVA FILTER PLACEMENT  01/2010   green filter; "due to blood clots"  . WISDOM TOOTH EXTRACTION  06/2007    reports that she has never smoked. She has never used smokeless tobacco. She reports that she does not drink alcohol or use drugs. family history  includes Arthritis in her mother; Breast cancer in her unknown relative; Diabetes in her father; Diabetic kidney disease in her daughter; Headache in her sister; Heart disease in her mother; Hypertension in her unknown relative; Migraines in her sister; Stroke in her father. Allergies  Allergen Reactions  . Cymbalta [Duloxetine Hcl] Swelling    Swelling in legs  . Gabapentin Swelling    Swelling in legs Swelling in legs  . Silicone Hives, Itching, Dermatitis and Rash  . Metformin And Related Other (See Comments)    dizzy, tired, chills, diarrhea, and nausea  . Adhesive [Tape] Rash    rash rash  . Cefazolin Hives    Hives Hives   . Ciprofloxacin Other (See Comments)    Other reaction(s): Other (See Comments) Per pt, caused body aches Per pt, caused body aches   . Clorazepate Dipotassium Other (See Comments)    Unknown reaction  . Darifenacin Hydrobromide Other (See Comments)    hypotension, near syncope  . Dilaudid [Hydromorphone Hcl] Other (See Comments)     confused, intense itching  . Doxycycline Rash  . Enablex [Darifenacin Hydrobromide Er] Other (See Comments)    Hypotension, near syncope  . Levofloxacin Other (See Comments)    Causes wrist pain  . Lyrica [Pregabalin] Swelling    Swelling in legs  . Methadone Hcl Other (See Comments)    Reaction unknown  . Morphine And Related Other (See Comments)     Confusion, constipation.   Loma Messing [Pentazocine] Other (See Comments)    "climbing walls" anxiety      Outpatient Encounter Medications as of 02/15/2018  Medication Sig  . Alpha-Lipoic Acid 600 MG CAPS Take 600 mg by mouth 2 (two) times daily.  Marland Kitchen amitriptyline (ELAVIL) 10 MG tablet Take 2 tablets (20 mg total) by mouth at bedtime.  . carvedilol (COREG) 25 MG tablet Take 25 mg by mouth 2 (two) times daily with a meal.  . Cholecalciferol (VITAMIN D3) 5000 UNITS CAPS Take 5,000 Units by mouth every evening. Reported on 11/24/2015  . clopidogrel (PLAVIX) 75 MG tablet Take 75 mg by mouth at bedtime.   . Cyanocobalamin (VITAMIN B-12) 500 MCG SUBL Place 500 mcg under the tongue daily.  . diclofenac sodium (VOLTAREN) 1 % GEL APPLY 2-4 GRAMS TO AFFECTED AREA 3 TIMES DAILY AS NEEDED  . FREESTYLE LITE test strip USE TO CHECK BLOOD SUGAR NO MORE THAN TWICE DAILY  . furosemide (LASIX) 20 MG tablet Take 20 mg by mouth daily.  Marland Kitchen HYDROcodone-acetaminophen (NORCO/VICODIN) 5-325 MG tablet Take 1 tablet by mouth every 6 (six) hours as needed for moderate pain.  . hydrocortisone (ANUSOL-HC) 25 MG suppository Place 1 suppository (25 mg total) rectally every 12 (twelve) hours.  . Lancets (FREESTYLE) lancets CHECK BLOOD SUGAR TWICE A DAY  . lidocaine (LIDODERM) 5 % Place 1 patch onto the skin daily. Remove & Discard patch within 12 hours or as directed by MD  . meclizine (ANTIVERT) 25 MG tablet Take 1 tablet (25 mg total) by mouth 2 (two) times daily as needed for dizziness.  . Multiple Vitamins-Minerals (PRESERVISION AREDS 2) CAPS Take 1 capsule by mouth 2 (two) times daily.  . nitroGLYCERIN (NITROSTAT) 0.4 MG SL tablet Place 0.4 mg under the tongue every 5 (five) minutes as needed for chest pain (x 3 doses). Reported on 04/14/2016  . omeprazole (PRILOSEC) 20 MG capsule TAKE ONE CAPSULE BY MOUTH TWICE DAILY  . ondansetron (ZOFRAN-ODT) 4 MG disintegrating tablet TAKE 1 TABLET BY MOUTH EVERY 8 HOURS  AS NEEDED FOR  NAUSEA OR VOMITING (Patient taking differently: TAKE 4 mg TABLET BY MOUTH EVERY 8 HOURS AS NEEDED FOR NAUSEA OR VOMITING)  . OVER THE COUNTER MEDICATION Take 1 capsule by mouth 2 (two) times daily. Integrative Digestive Formula  . OVER THE COUNTER MEDICATION 1 tablet 3 (three) times daily. Berberorine Gluco Defense   . Polyvinyl Alcohol-Povidone (REFRESH OP) Place 1 drop into both eyes 3 (three) times daily as needed (dry eyes).   . rivaroxaban (XARELTO) 2.5 MG TABS tablet Take 2.5 mg by mouth 2 (two) times daily.  . [DISCONTINUED] hydrocortisone (ANUSOL-HC) 25 MG suppository Place 1 suppository (25 mg total) rectally every 12 (twelve) hours.  . hydrocortisone (ANUSOL-HC) 2.5 % rectal cream Place 1 application rectally 2 (two) times daily.   No facility-administered encounter medications on file as of 02/15/2018.      REVIEW OF SYSTEMS  : All other systems reviewed and negative except where noted in the History of Present Illness.   PHYSICAL EXAM: BP 118/68   Pulse 68   Ht _0  (1.6 m)   BMI 34.80 kg/m   General: Well developed white female in no acute distress Head: Normocephalic and atraumatic Eyes:  Sclerae anicteric, conjunctiva pink. Ears: Normal auditory acuity Lungs: Clear throughout to auscultation; no increased WOB. Heart: Regular rate and rhythm; no M/R/G. Abdomen: Soft, non-distended.  BS present.  Non-tender. Rectal:  External hemorrhoids noted, two larger hemorrhoids with some excoriation and stigmata of recent bleeding.  DRE did not reveal any masses.  Light brown stool seen on exam glove. Musculoskeletal: Symmetrical with no gross deformities.  Right BKA noted. Skin: No lesions on visible extremities Neurological: Alert oriented x 4, grossly non-focal Psychological:  Alert and cooperative. Normal mood and affect  ASSESSMENT AND PLAN: *Rectal bleeding:  Highly suspect hemorrhoidal bleeding as there were two larger hemorrhoids with excoriation and stigmata of recent  bleeding seen on exam today.  This is also in the setting of Plavix and Xarelto.  Will use hydrocortisone suppositories and cream for 10 days.  Will check CBC today.   CC:  Colon Branch, MD

## 2018-02-15 NOTE — Patient Instructions (Signed)
If you are age 81 or older, your body mass index should be between 23-30. Your Body mass index is 34.8 kg/m. If this is out of the aforementioned range listed, please consider follow up with your Primary Care Provider.  If you are age 50 or younger, your body mass index should be between 19-25. Your Body mass index is 34.8 kg/m. If this is out of the aformentioned range listed, please consider follow up with your Primary Care Provider.   Your provider has requested that you go to the basement level for lab work before leaving today. Press "B" on the elevator. The lab is located at the first door on the left as you exit the elevator.  Thank you for choosing me and [facility].

## 2018-02-19 ENCOUNTER — Telehealth: Payer: Self-pay

## 2018-02-19 DIAGNOSIS — G8921 Chronic pain due to trauma: Secondary | ICD-10-CM | POA: Diagnosis not present

## 2018-02-19 DIAGNOSIS — M545 Low back pain: Secondary | ICD-10-CM | POA: Diagnosis not present

## 2018-02-19 DIAGNOSIS — G894 Chronic pain syndrome: Secondary | ICD-10-CM | POA: Diagnosis not present

## 2018-02-19 DIAGNOSIS — G8929 Other chronic pain: Secondary | ICD-10-CM | POA: Diagnosis not present

## 2018-02-19 DIAGNOSIS — M6281 Muscle weakness (generalized): Secondary | ICD-10-CM | POA: Diagnosis not present

## 2018-02-19 DIAGNOSIS — R2689 Other abnormalities of gait and mobility: Secondary | ICD-10-CM | POA: Diagnosis not present

## 2018-02-19 DIAGNOSIS — M25512 Pain in left shoulder: Secondary | ICD-10-CM | POA: Diagnosis not present

## 2018-02-19 DIAGNOSIS — M25562 Pain in left knee: Secondary | ICD-10-CM | POA: Diagnosis not present

## 2018-02-19 NOTE — Telephone Encounter (Signed)
Paperwork has been received, will work on tomorrow, then forward to provider/SLS 04/30

## 2018-02-19 NOTE — Telephone Encounter (Signed)
Copied from [place]. Topic: Inquiry >> Feb 19, 2018 11:50 AM Scherrie Gerlach wrote: Reason for CRM: pt wants to make sure you received the fax from [facility] for her diabetic shoes. Kelvin advise he got confirmation from fax, but she just wants to make sure

## 2018-02-19 NOTE — Telephone Encounter (Signed)
Noted  

## 2018-02-20 NOTE — Telephone Encounter (Signed)
Completed as much as possible on paperwork from [facility]; forwarded to provider with last OV note/SLS 05/01

## 2018-02-21 DIAGNOSIS — M545 Low back pain: Secondary | ICD-10-CM | POA: Diagnosis not present

## 2018-02-21 DIAGNOSIS — M25512 Pain in left shoulder: Secondary | ICD-10-CM | POA: Diagnosis not present

## 2018-02-21 DIAGNOSIS — G8921 Chronic pain due to trauma: Secondary | ICD-10-CM | POA: Diagnosis not present

## 2018-02-21 DIAGNOSIS — R2689 Other abnormalities of gait and mobility: Secondary | ICD-10-CM | POA: Diagnosis not present

## 2018-02-21 DIAGNOSIS — G8929 Other chronic pain: Secondary | ICD-10-CM | POA: Diagnosis not present

## 2018-02-21 DIAGNOSIS — M25562 Pain in left knee: Secondary | ICD-10-CM | POA: Diagnosis not present

## 2018-02-21 DIAGNOSIS — G894 Chronic pain syndrome: Secondary | ICD-10-CM | POA: Diagnosis not present

## 2018-02-21 DIAGNOSIS — M6281 Muscle weakness (generalized): Secondary | ICD-10-CM | POA: Diagnosis not present

## 2018-02-22 NOTE — Telephone Encounter (Addendum)
Forms completed and faxed to [facility] at [phone]. LMOM informing Pt that forms have been faxed back.

## 2018-02-22 NOTE — Progress Notes (Signed)
Reviewed and agree with documentation and assessment and plan. K. Veena Hiroki Wint , MD   

## 2018-02-22 NOTE — Telephone Encounter (Signed)
Received fax confirmation. Form sent for scanning.  

## 2018-02-26 DIAGNOSIS — M25562 Pain in left knee: Secondary | ICD-10-CM | POA: Diagnosis not present

## 2018-02-26 DIAGNOSIS — M545 Low back pain: Secondary | ICD-10-CM | POA: Diagnosis not present

## 2018-02-26 DIAGNOSIS — G8921 Chronic pain due to trauma: Secondary | ICD-10-CM | POA: Diagnosis not present

## 2018-02-26 DIAGNOSIS — G8929 Other chronic pain: Secondary | ICD-10-CM | POA: Diagnosis not present

## 2018-02-26 DIAGNOSIS — G894 Chronic pain syndrome: Secondary | ICD-10-CM | POA: Diagnosis not present

## 2018-02-26 DIAGNOSIS — M25512 Pain in left shoulder: Secondary | ICD-10-CM | POA: Diagnosis not present

## 2018-02-26 DIAGNOSIS — R2689 Other abnormalities of gait and mobility: Secondary | ICD-10-CM | POA: Diagnosis not present

## 2018-02-26 DIAGNOSIS — M6281 Muscle weakness (generalized): Secondary | ICD-10-CM | POA: Diagnosis not present

## 2018-03-01 ENCOUNTER — Telehealth: Payer: Self-pay | Admitting: *Deleted

## 2018-03-01 DIAGNOSIS — G8921 Chronic pain due to trauma: Secondary | ICD-10-CM | POA: Diagnosis not present

## 2018-03-01 DIAGNOSIS — G894 Chronic pain syndrome: Secondary | ICD-10-CM | POA: Diagnosis not present

## 2018-03-01 DIAGNOSIS — G8929 Other chronic pain: Secondary | ICD-10-CM | POA: Diagnosis not present

## 2018-03-01 DIAGNOSIS — M25562 Pain in left knee: Secondary | ICD-10-CM | POA: Diagnosis not present

## 2018-03-01 DIAGNOSIS — M545 Low back pain: Secondary | ICD-10-CM | POA: Diagnosis not present

## 2018-03-01 DIAGNOSIS — M25512 Pain in left shoulder: Secondary | ICD-10-CM | POA: Diagnosis not present

## 2018-03-01 DIAGNOSIS — M6281 Muscle weakness (generalized): Secondary | ICD-10-CM | POA: Diagnosis not present

## 2018-03-01 DIAGNOSIS — R2689 Other abnormalities of gait and mobility: Secondary | ICD-10-CM | POA: Diagnosis not present

## 2018-03-01 NOTE — Telephone Encounter (Signed)
Received Physician Orders/Detailed Written Order from [facility]; forwarded to provider/SLS 05/10

## 2018-03-04 NOTE — Telephone Encounter (Signed)
Orders signed and faxed to [facility] at [phone]. Form sent for scanning.

## 2018-03-05 DIAGNOSIS — M6281 Muscle weakness (generalized): Secondary | ICD-10-CM | POA: Diagnosis not present

## 2018-03-05 DIAGNOSIS — M545 Low back pain: Secondary | ICD-10-CM | POA: Diagnosis not present

## 2018-03-05 DIAGNOSIS — M25512 Pain in left shoulder: Secondary | ICD-10-CM | POA: Diagnosis not present

## 2018-03-05 DIAGNOSIS — G8929 Other chronic pain: Secondary | ICD-10-CM | POA: Diagnosis not present

## 2018-03-05 DIAGNOSIS — M25562 Pain in left knee: Secondary | ICD-10-CM | POA: Diagnosis not present

## 2018-03-05 DIAGNOSIS — R2689 Other abnormalities of gait and mobility: Secondary | ICD-10-CM | POA: Diagnosis not present

## 2018-03-05 DIAGNOSIS — G894 Chronic pain syndrome: Secondary | ICD-10-CM | POA: Diagnosis not present

## 2018-03-05 DIAGNOSIS — G8921 Chronic pain due to trauma: Secondary | ICD-10-CM | POA: Diagnosis not present

## 2018-03-07 DIAGNOSIS — M25512 Pain in left shoulder: Secondary | ICD-10-CM | POA: Diagnosis not present

## 2018-03-07 DIAGNOSIS — M545 Low back pain: Secondary | ICD-10-CM | POA: Diagnosis not present

## 2018-03-07 DIAGNOSIS — M25562 Pain in left knee: Secondary | ICD-10-CM | POA: Diagnosis not present

## 2018-03-07 DIAGNOSIS — G8921 Chronic pain due to trauma: Secondary | ICD-10-CM | POA: Diagnosis not present

## 2018-03-07 DIAGNOSIS — R2689 Other abnormalities of gait and mobility: Secondary | ICD-10-CM | POA: Diagnosis not present

## 2018-03-07 DIAGNOSIS — G8929 Other chronic pain: Secondary | ICD-10-CM | POA: Diagnosis not present

## 2018-03-07 DIAGNOSIS — M6281 Muscle weakness (generalized): Secondary | ICD-10-CM | POA: Diagnosis not present

## 2018-03-07 DIAGNOSIS — G894 Chronic pain syndrome: Secondary | ICD-10-CM | POA: Diagnosis not present

## 2018-03-12 DIAGNOSIS — G8929 Other chronic pain: Secondary | ICD-10-CM | POA: Diagnosis not present

## 2018-03-12 DIAGNOSIS — G8921 Chronic pain due to trauma: Secondary | ICD-10-CM | POA: Diagnosis not present

## 2018-03-12 DIAGNOSIS — R2689 Other abnormalities of gait and mobility: Secondary | ICD-10-CM | POA: Diagnosis not present

## 2018-03-12 DIAGNOSIS — G894 Chronic pain syndrome: Secondary | ICD-10-CM | POA: Diagnosis not present

## 2018-03-12 DIAGNOSIS — M6281 Muscle weakness (generalized): Secondary | ICD-10-CM | POA: Diagnosis not present

## 2018-03-12 DIAGNOSIS — M25512 Pain in left shoulder: Secondary | ICD-10-CM | POA: Diagnosis not present

## 2018-03-12 DIAGNOSIS — M25562 Pain in left knee: Secondary | ICD-10-CM | POA: Diagnosis not present

## 2018-03-12 DIAGNOSIS — M545 Low back pain: Secondary | ICD-10-CM | POA: Diagnosis not present

## 2018-03-14 DIAGNOSIS — M545 Low back pain: Secondary | ICD-10-CM | POA: Diagnosis not present

## 2018-03-14 DIAGNOSIS — G8921 Chronic pain due to trauma: Secondary | ICD-10-CM | POA: Diagnosis not present

## 2018-03-14 DIAGNOSIS — R2689 Other abnormalities of gait and mobility: Secondary | ICD-10-CM | POA: Diagnosis not present

## 2018-03-14 DIAGNOSIS — M25562 Pain in left knee: Secondary | ICD-10-CM | POA: Diagnosis not present

## 2018-03-14 DIAGNOSIS — G8929 Other chronic pain: Secondary | ICD-10-CM | POA: Diagnosis not present

## 2018-03-14 DIAGNOSIS — M25512 Pain in left shoulder: Secondary | ICD-10-CM | POA: Diagnosis not present

## 2018-03-14 DIAGNOSIS — G894 Chronic pain syndrome: Secondary | ICD-10-CM | POA: Diagnosis not present

## 2018-03-14 DIAGNOSIS — M6281 Muscle weakness (generalized): Secondary | ICD-10-CM | POA: Diagnosis not present

## 2018-03-19 ENCOUNTER — Telehealth: Payer: Self-pay | Admitting: Internal Medicine

## 2018-03-19 ENCOUNTER — Telehealth: Payer: Self-pay | Admitting: *Deleted

## 2018-03-19 NOTE — Telephone Encounter (Signed)
Received Physician Orders from [facility]; forwarded to provider/SLS 05/28

## 2018-03-19 NOTE — Telephone Encounter (Signed)
Copied from [place] [phone]. Topic: Quick Communication - Rx Refill/Question >> Mar 19, 2018  3:54 PM Synthia Innocent wrote: Medication: HYDROcodone-acetaminophen (NORCO/VICODIN) 5-325 MG tablet   Has the patient contacted their pharmacy? Yes, patient states they tried contacting provider with no luck (Agent: If no, request that the patient contact the pharmacy for the refill.) (Agent: If yes, when and what did the pharmacy advise?)  Preferred Pharmacy (with phone number or street name): [facility] [address] [place]   Agent: Please be advised that RX refills may take up to 3 business days. We ask that you follow-up with your pharmacy.

## 2018-03-20 DIAGNOSIS — G894 Chronic pain syndrome: Secondary | ICD-10-CM | POA: Diagnosis not present

## 2018-03-20 DIAGNOSIS — R2689 Other abnormalities of gait and mobility: Secondary | ICD-10-CM | POA: Diagnosis not present

## 2018-03-20 DIAGNOSIS — M6281 Muscle weakness (generalized): Secondary | ICD-10-CM | POA: Diagnosis not present

## 2018-03-20 DIAGNOSIS — G8929 Other chronic pain: Secondary | ICD-10-CM | POA: Diagnosis not present

## 2018-03-20 DIAGNOSIS — M25512 Pain in left shoulder: Secondary | ICD-10-CM | POA: Diagnosis not present

## 2018-03-20 DIAGNOSIS — M25562 Pain in left knee: Secondary | ICD-10-CM | POA: Diagnosis not present

## 2018-03-20 DIAGNOSIS — M545 Low back pain: Secondary | ICD-10-CM | POA: Diagnosis not present

## 2018-03-20 DIAGNOSIS — G8921 Chronic pain due to trauma: Secondary | ICD-10-CM | POA: Diagnosis not present

## 2018-03-20 MED ORDER — HYDROCODONE-ACETAMINOPHEN 5-325 MG PO TABS: 1.0000 | ORAL_TABLET | Freq: Four times a day (QID) | ORAL | 0 refills | Status: DC | PRN

## 2018-03-20 NOTE — Telephone Encounter (Signed)
Hydrocodone-Acetaminophen 5-325 LOV: 01/18/18 Last Refill: 02/07/18 PCP: Dr Larose Kells Pharmacy: [facility] [phone] [place], [place]

## 2018-03-20 NOTE — Addendum Note (Signed)
Addended by: [name] E on: 03/20/2018 12:38 PM   Modules accepted: Orders

## 2018-03-20 NOTE — Addendum Note (Signed)
Addended byDamita Dunnings D on: 03/20/2018 07:37 AM   Modules accepted: Orders

## 2018-03-20 NOTE — Telephone Encounter (Signed)
Order for R suspension sleeve signed and faxed to [facility] at [phone]. Form sent for scanning.

## 2018-03-20 NOTE — Telephone Encounter (Signed)
Sent!

## 2018-03-20 NOTE — Telephone Encounter (Signed)
Pt is requesting refill on hydrocodone 5-325mg .   Last OV: 01/18/2018 Last Fill: 02/07/2018 #120 and 0RF (Pt sig: 1 tab q6h prn) UDS: 01/18/2018 Low risk  NCCR printed- no discrepancies noted  Please advise.

## 2018-03-21 DIAGNOSIS — M25562 Pain in left knee: Secondary | ICD-10-CM | POA: Diagnosis not present

## 2018-03-21 DIAGNOSIS — R2689 Other abnormalities of gait and mobility: Secondary | ICD-10-CM | POA: Diagnosis not present

## 2018-03-21 DIAGNOSIS — G8921 Chronic pain due to trauma: Secondary | ICD-10-CM | POA: Diagnosis not present

## 2018-03-21 DIAGNOSIS — M6281 Muscle weakness (generalized): Secondary | ICD-10-CM | POA: Diagnosis not present

## 2018-03-21 DIAGNOSIS — M25512 Pain in left shoulder: Secondary | ICD-10-CM | POA: Diagnosis not present

## 2018-03-21 DIAGNOSIS — G894 Chronic pain syndrome: Secondary | ICD-10-CM | POA: Diagnosis not present

## 2018-03-21 DIAGNOSIS — G8929 Other chronic pain: Secondary | ICD-10-CM | POA: Diagnosis not present

## 2018-03-21 DIAGNOSIS — M545 Low back pain: Secondary | ICD-10-CM | POA: Diagnosis not present

## 2018-03-26 DIAGNOSIS — R2689 Other abnormalities of gait and mobility: Secondary | ICD-10-CM | POA: Diagnosis not present

## 2018-03-26 DIAGNOSIS — G8921 Chronic pain due to trauma: Secondary | ICD-10-CM | POA: Diagnosis not present

## 2018-03-26 DIAGNOSIS — G8929 Other chronic pain: Secondary | ICD-10-CM | POA: Diagnosis not present

## 2018-03-26 DIAGNOSIS — M25562 Pain in left knee: Secondary | ICD-10-CM | POA: Diagnosis not present

## 2018-03-26 DIAGNOSIS — M6281 Muscle weakness (generalized): Secondary | ICD-10-CM | POA: Diagnosis not present

## 2018-03-26 DIAGNOSIS — G894 Chronic pain syndrome: Secondary | ICD-10-CM | POA: Diagnosis not present

## 2018-03-26 DIAGNOSIS — M545 Low back pain: Secondary | ICD-10-CM | POA: Diagnosis not present

## 2018-03-26 DIAGNOSIS — M25512 Pain in left shoulder: Secondary | ICD-10-CM | POA: Diagnosis not present

## 2018-03-28 DIAGNOSIS — R2689 Other abnormalities of gait and mobility: Secondary | ICD-10-CM | POA: Diagnosis not present

## 2018-03-28 DIAGNOSIS — M25562 Pain in left knee: Secondary | ICD-10-CM | POA: Diagnosis not present

## 2018-03-28 DIAGNOSIS — G8921 Chronic pain due to trauma: Secondary | ICD-10-CM | POA: Diagnosis not present

## 2018-03-28 DIAGNOSIS — G894 Chronic pain syndrome: Secondary | ICD-10-CM | POA: Diagnosis not present

## 2018-03-28 DIAGNOSIS — G8929 Other chronic pain: Secondary | ICD-10-CM | POA: Diagnosis not present

## 2018-03-28 DIAGNOSIS — M6281 Muscle weakness (generalized): Secondary | ICD-10-CM | POA: Diagnosis not present

## 2018-03-28 DIAGNOSIS — M25512 Pain in left shoulder: Secondary | ICD-10-CM | POA: Diagnosis not present

## 2018-03-28 DIAGNOSIS — M545 Low back pain: Secondary | ICD-10-CM | POA: Diagnosis not present

## 2018-03-29 DIAGNOSIS — I70213 Atherosclerosis of native arteries of extremities with intermittent claudication, bilateral legs: Secondary | ICD-10-CM | POA: Diagnosis not present

## 2018-04-02 DIAGNOSIS — G8929 Other chronic pain: Secondary | ICD-10-CM | POA: Diagnosis not present

## 2018-04-02 DIAGNOSIS — M25512 Pain in left shoulder: Secondary | ICD-10-CM | POA: Diagnosis not present

## 2018-04-02 DIAGNOSIS — R2689 Other abnormalities of gait and mobility: Secondary | ICD-10-CM | POA: Diagnosis not present

## 2018-04-02 DIAGNOSIS — M545 Low back pain: Secondary | ICD-10-CM | POA: Diagnosis not present

## 2018-04-02 DIAGNOSIS — G894 Chronic pain syndrome: Secondary | ICD-10-CM | POA: Diagnosis not present

## 2018-04-02 DIAGNOSIS — G8921 Chronic pain due to trauma: Secondary | ICD-10-CM | POA: Diagnosis not present

## 2018-04-02 DIAGNOSIS — M6281 Muscle weakness (generalized): Secondary | ICD-10-CM | POA: Diagnosis not present

## 2018-04-02 DIAGNOSIS — M25562 Pain in left knee: Secondary | ICD-10-CM | POA: Diagnosis not present

## 2018-04-03 ENCOUNTER — Telehealth: Payer: Self-pay | Admitting: Internal Medicine

## 2018-04-03 NOTE — Telephone Encounter (Signed)
Received fax confirmation 04/03/2018 at 3:35pm.

## 2018-04-03 NOTE — Telephone Encounter (Signed)
Copied from [place] [id number]. Topic: Quick Communication - See Telephone Encounter >> Apr 03, 2018  3:53 PM Cleaster Corin, NT wrote: CRM for notification. See Telephone encounter for: 04/03/18.  Forms completed and faxed to [facility] at [phone] didn't have all information needed Eulis Foster can be reached at [phone]

## 2018-04-03 NOTE — Telephone Encounter (Signed)
Order printed from [facility] and refaxed.

## 2018-04-03 NOTE — Telephone Encounter (Signed)
Patient stated the [facility] did not receive the order. She said she would like it refaxed to attn: Jonelle Sidle, [phone]

## 2018-04-05 NOTE — Telephone Encounter (Signed)
They need to inform Pt- she lives in [place].

## 2018-04-05 NOTE — Telephone Encounter (Signed)
Per [facility], patient will need patient to have face-to-face appointment with PCP and documentation in the note as to "why patient needs the new supply [suspension sleeve for prosthesis] requested" and then have the OV note faxed to them/SLS 06/14

## 2018-04-09 ENCOUNTER — Telehealth: Payer: Self-pay | Admitting: Gastroenterology

## 2018-04-09 DIAGNOSIS — M545 Low back pain: Secondary | ICD-10-CM | POA: Diagnosis not present

## 2018-04-09 DIAGNOSIS — G8921 Chronic pain due to trauma: Secondary | ICD-10-CM | POA: Diagnosis not present

## 2018-04-09 DIAGNOSIS — M25512 Pain in left shoulder: Secondary | ICD-10-CM | POA: Diagnosis not present

## 2018-04-09 DIAGNOSIS — M25562 Pain in left knee: Secondary | ICD-10-CM | POA: Diagnosis not present

## 2018-04-09 DIAGNOSIS — G894 Chronic pain syndrome: Secondary | ICD-10-CM | POA: Diagnosis not present

## 2018-04-09 DIAGNOSIS — M6281 Muscle weakness (generalized): Secondary | ICD-10-CM | POA: Diagnosis not present

## 2018-04-09 DIAGNOSIS — G8929 Other chronic pain: Secondary | ICD-10-CM | POA: Diagnosis not present

## 2018-04-09 DIAGNOSIS — R2689 Other abnormalities of gait and mobility: Secondary | ICD-10-CM | POA: Diagnosis not present

## 2018-04-09 NOTE — Telephone Encounter (Signed)
Ok to prescribe anusol suppositories at bedtime x 7 days.  Last colonoscopy was 2017 so UTD.  Thank you,  Jess

## 2018-04-09 NOTE — Telephone Encounter (Signed)
Spoke with patient's daughter [name]. Patient is having rectal bleeding again.She has hx hemorrhoids and is on Xarelto and Plavix.Patient is currently living in [place] with her son. She is wondering if patient could have the Anusol suppositories and cream to try again. She will bring her in for OV if needed next week. Please, advise.

## 2018-04-09 NOTE — Telephone Encounter (Signed)
Pt's daughter [name] called stated that pt is still having the same issue. She wants some advise on what else they can do.

## 2018-04-10 ENCOUNTER — Other Ambulatory Visit: Payer: Self-pay | Admitting: *Deleted

## 2018-04-10 ENCOUNTER — Telehealth: Payer: Self-pay | Admitting: *Deleted

## 2018-04-10 MED ORDER — HYDROCORTISONE ACETATE 25 MG RE SUPP: 25.0000 mg | Freq: Two times a day (BID) | RECTAL | 0 refills | Status: DC

## 2018-04-10 MED ORDER — HYDROCORTISONE 2.5 % RE CREA: 1.0000 "application " | TOPICAL_CREAM | Freq: Two times a day (BID) | RECTAL | 0 refills | Status: DC

## 2018-04-10 NOTE — Progress Notes (Signed)
Spoke with patient's daughter and gave her recommendations. Rx called to [facility] in [place], [place] per her request. [phone])

## 2018-04-10 NOTE — Telephone Encounter (Signed)
Received Physician Detailed Written Orders from [place]; forwarded to provider/SLS 06/19

## 2018-04-11 DIAGNOSIS — M25562 Pain in left knee: Secondary | ICD-10-CM | POA: Diagnosis not present

## 2018-04-11 DIAGNOSIS — R2689 Other abnormalities of gait and mobility: Secondary | ICD-10-CM | POA: Diagnosis not present

## 2018-04-11 DIAGNOSIS — M6281 Muscle weakness (generalized): Secondary | ICD-10-CM | POA: Diagnosis not present

## 2018-04-11 DIAGNOSIS — G8921 Chronic pain due to trauma: Secondary | ICD-10-CM | POA: Diagnosis not present

## 2018-04-11 DIAGNOSIS — G8929 Other chronic pain: Secondary | ICD-10-CM | POA: Diagnosis not present

## 2018-04-11 DIAGNOSIS — M25512 Pain in left shoulder: Secondary | ICD-10-CM | POA: Diagnosis not present

## 2018-04-11 DIAGNOSIS — G894 Chronic pain syndrome: Secondary | ICD-10-CM | POA: Diagnosis not present

## 2018-04-11 DIAGNOSIS — M545 Low back pain: Secondary | ICD-10-CM | POA: Diagnosis not present

## 2018-04-11 NOTE — Telephone Encounter (Signed)
Orders signed and faxed to [facility]- at [phone]. Form sent for scanning.

## 2018-04-12 DIAGNOSIS — I25728 Atherosclerosis of autologous artery coronary artery bypass graft(s) with other forms of angina pectoris: Secondary | ICD-10-CM | POA: Diagnosis not present

## 2018-04-12 DIAGNOSIS — I10 Essential (primary) hypertension: Secondary | ICD-10-CM | POA: Diagnosis not present

## 2018-04-12 DIAGNOSIS — I70213 Atherosclerosis of native arteries of extremities with intermittent claudication, bilateral legs: Secondary | ICD-10-CM | POA: Diagnosis not present

## 2018-04-15 DIAGNOSIS — G894 Chronic pain syndrome: Secondary | ICD-10-CM | POA: Diagnosis not present

## 2018-04-15 DIAGNOSIS — M25512 Pain in left shoulder: Secondary | ICD-10-CM | POA: Diagnosis not present

## 2018-04-15 DIAGNOSIS — G8929 Other chronic pain: Secondary | ICD-10-CM | POA: Diagnosis not present

## 2018-04-15 DIAGNOSIS — G8921 Chronic pain due to trauma: Secondary | ICD-10-CM | POA: Diagnosis not present

## 2018-04-15 DIAGNOSIS — M25562 Pain in left knee: Secondary | ICD-10-CM | POA: Diagnosis not present

## 2018-04-15 DIAGNOSIS — M545 Low back pain: Secondary | ICD-10-CM | POA: Diagnosis not present

## 2018-04-15 DIAGNOSIS — R2689 Other abnormalities of gait and mobility: Secondary | ICD-10-CM | POA: Diagnosis not present

## 2018-04-15 DIAGNOSIS — M6281 Muscle weakness (generalized): Secondary | ICD-10-CM | POA: Diagnosis not present

## 2018-04-16 DIAGNOSIS — I25728 Atherosclerosis of autologous artery coronary artery bypass graft(s) with other forms of angina pectoris: Secondary | ICD-10-CM | POA: Diagnosis not present

## 2018-04-16 DIAGNOSIS — E119 Type 2 diabetes mellitus without complications: Secondary | ICD-10-CM | POA: Diagnosis not present

## 2018-04-16 DIAGNOSIS — E782 Mixed hyperlipidemia: Secondary | ICD-10-CM | POA: Diagnosis not present

## 2018-04-16 DIAGNOSIS — I10 Essential (primary) hypertension: Secondary | ICD-10-CM | POA: Diagnosis not present

## 2018-04-17 DIAGNOSIS — G8929 Other chronic pain: Secondary | ICD-10-CM | POA: Diagnosis not present

## 2018-04-17 DIAGNOSIS — G8921 Chronic pain due to trauma: Secondary | ICD-10-CM | POA: Diagnosis not present

## 2018-04-17 DIAGNOSIS — G894 Chronic pain syndrome: Secondary | ICD-10-CM | POA: Diagnosis not present

## 2018-04-17 DIAGNOSIS — M25512 Pain in left shoulder: Secondary | ICD-10-CM | POA: Diagnosis not present

## 2018-04-17 DIAGNOSIS — M25562 Pain in left knee: Secondary | ICD-10-CM | POA: Diagnosis not present

## 2018-04-17 DIAGNOSIS — R2689 Other abnormalities of gait and mobility: Secondary | ICD-10-CM | POA: Diagnosis not present

## 2018-04-17 DIAGNOSIS — M545 Low back pain: Secondary | ICD-10-CM | POA: Diagnosis not present

## 2018-04-17 DIAGNOSIS — M6281 Muscle weakness (generalized): Secondary | ICD-10-CM | POA: Diagnosis not present

## 2018-04-18 ENCOUNTER — Telehealth: Payer: Self-pay | Admitting: Internal Medicine

## 2018-04-18 NOTE — Telephone Encounter (Signed)
Copied from [place] [phone]. Topic: Quick Communication - See Telephone Encounter >> Apr 18, 2018  2:19 PM Mylinda Latina, NT wrote: CRM for notification. See Telephone encounter for: 04/18/18. Patient called and states she needs a refill of HYDROcodone-acetaminophen (NORCO/VICODIN) 5-325 MG tablet   [facility] [phone] - [place], [place] - [address] AT [address] [phone] (Phone) [phone] (Fax)

## 2018-04-19 DIAGNOSIS — H01002 Unspecified blepharitis right lower eyelid: Secondary | ICD-10-CM | POA: Diagnosis not present

## 2018-04-19 DIAGNOSIS — Z961 Presence of intraocular lens: Secondary | ICD-10-CM | POA: Diagnosis not present

## 2018-04-19 DIAGNOSIS — H01004 Unspecified blepharitis left upper eyelid: Secondary | ICD-10-CM | POA: Diagnosis not present

## 2018-04-19 DIAGNOSIS — H01001 Unspecified blepharitis right upper eyelid: Secondary | ICD-10-CM | POA: Diagnosis not present

## 2018-04-19 DIAGNOSIS — H04123 Dry eye syndrome of bilateral lacrimal glands: Secondary | ICD-10-CM | POA: Diagnosis not present

## 2018-04-19 LAB — HM DIABETES EYE EXAM

## 2018-04-19 MED ORDER — HYDROCODONE-ACETAMINOPHEN 5-325 MG PO TABS: 1.0000 | ORAL_TABLET | Freq: Four times a day (QID) | ORAL | 0 refills | Status: DC | PRN

## 2018-04-19 NOTE — Telephone Encounter (Signed)
sent 

## 2018-04-19 NOTE — Telephone Encounter (Signed)
Pt is requesting refill on hydrocodone.   Last OV: 01/18/2018 Last Fill: 03/20/2018 #120 and 0RF (Pt sig: 1 tab q6h prn) UDS: 01/18/2018 Low risk  NCCR printed- no discrepancies noted- sent for scanning  Please advise.

## 2018-04-19 NOTE — Telephone Encounter (Signed)
Refill request for controlled medication: Norco/Vicodin 5-325 mg Last refill 03/20/18 LOV  03/29 NOV  05/20/18 Dr. D[facility] R[facility] [place], [place]

## 2018-04-23 DIAGNOSIS — M545 Low back pain: Secondary | ICD-10-CM | POA: Diagnosis not present

## 2018-04-23 DIAGNOSIS — R2689 Other abnormalities of gait and mobility: Secondary | ICD-10-CM | POA: Diagnosis not present

## 2018-04-23 DIAGNOSIS — G8929 Other chronic pain: Secondary | ICD-10-CM | POA: Diagnosis not present

## 2018-04-23 DIAGNOSIS — G8921 Chronic pain due to trauma: Secondary | ICD-10-CM | POA: Diagnosis not present

## 2018-04-23 DIAGNOSIS — M25562 Pain in left knee: Secondary | ICD-10-CM | POA: Diagnosis not present

## 2018-04-23 DIAGNOSIS — M25512 Pain in left shoulder: Secondary | ICD-10-CM | POA: Diagnosis not present

## 2018-04-23 DIAGNOSIS — M6281 Muscle weakness (generalized): Secondary | ICD-10-CM | POA: Diagnosis not present

## 2018-04-23 DIAGNOSIS — G894 Chronic pain syndrome: Secondary | ICD-10-CM | POA: Diagnosis not present

## 2018-04-24 DIAGNOSIS — M545 Low back pain: Secondary | ICD-10-CM | POA: Diagnosis not present

## 2018-04-24 DIAGNOSIS — M6281 Muscle weakness (generalized): Secondary | ICD-10-CM | POA: Diagnosis not present

## 2018-04-24 DIAGNOSIS — G8921 Chronic pain due to trauma: Secondary | ICD-10-CM | POA: Diagnosis not present

## 2018-04-24 DIAGNOSIS — R2689 Other abnormalities of gait and mobility: Secondary | ICD-10-CM | POA: Diagnosis not present

## 2018-04-24 DIAGNOSIS — M25512 Pain in left shoulder: Secondary | ICD-10-CM | POA: Diagnosis not present

## 2018-04-24 DIAGNOSIS — M25562 Pain in left knee: Secondary | ICD-10-CM | POA: Diagnosis not present

## 2018-04-24 DIAGNOSIS — G8929 Other chronic pain: Secondary | ICD-10-CM | POA: Diagnosis not present

## 2018-04-24 DIAGNOSIS — G894 Chronic pain syndrome: Secondary | ICD-10-CM | POA: Diagnosis not present

## 2018-04-29 DIAGNOSIS — G894 Chronic pain syndrome: Secondary | ICD-10-CM | POA: Diagnosis not present

## 2018-04-29 DIAGNOSIS — M25562 Pain in left knee: Secondary | ICD-10-CM | POA: Diagnosis not present

## 2018-04-29 DIAGNOSIS — G8929 Other chronic pain: Secondary | ICD-10-CM | POA: Diagnosis not present

## 2018-04-29 DIAGNOSIS — M6281 Muscle weakness (generalized): Secondary | ICD-10-CM | POA: Diagnosis not present

## 2018-04-29 DIAGNOSIS — M545 Low back pain: Secondary | ICD-10-CM | POA: Diagnosis not present

## 2018-04-29 DIAGNOSIS — R2689 Other abnormalities of gait and mobility: Secondary | ICD-10-CM | POA: Diagnosis not present

## 2018-04-29 DIAGNOSIS — G8921 Chronic pain due to trauma: Secondary | ICD-10-CM | POA: Diagnosis not present

## 2018-04-29 DIAGNOSIS — M25512 Pain in left shoulder: Secondary | ICD-10-CM | POA: Diagnosis not present

## 2018-05-01 DIAGNOSIS — G894 Chronic pain syndrome: Secondary | ICD-10-CM | POA: Diagnosis not present

## 2018-05-01 DIAGNOSIS — M6281 Muscle weakness (generalized): Secondary | ICD-10-CM | POA: Diagnosis not present

## 2018-05-01 DIAGNOSIS — G8921 Chronic pain due to trauma: Secondary | ICD-10-CM | POA: Diagnosis not present

## 2018-05-01 DIAGNOSIS — M25562 Pain in left knee: Secondary | ICD-10-CM | POA: Diagnosis not present

## 2018-05-01 DIAGNOSIS — G8929 Other chronic pain: Secondary | ICD-10-CM | POA: Diagnosis not present

## 2018-05-01 DIAGNOSIS — M545 Low back pain: Secondary | ICD-10-CM | POA: Diagnosis not present

## 2018-05-01 DIAGNOSIS — M25512 Pain in left shoulder: Secondary | ICD-10-CM | POA: Diagnosis not present

## 2018-05-01 DIAGNOSIS — R2689 Other abnormalities of gait and mobility: Secondary | ICD-10-CM | POA: Diagnosis not present

## 2018-05-06 DIAGNOSIS — M25562 Pain in left knee: Secondary | ICD-10-CM | POA: Diagnosis not present

## 2018-05-06 DIAGNOSIS — G894 Chronic pain syndrome: Secondary | ICD-10-CM | POA: Diagnosis not present

## 2018-05-06 DIAGNOSIS — M25512 Pain in left shoulder: Secondary | ICD-10-CM | POA: Diagnosis not present

## 2018-05-06 DIAGNOSIS — R2689 Other abnormalities of gait and mobility: Secondary | ICD-10-CM | POA: Diagnosis not present

## 2018-05-06 DIAGNOSIS — M6281 Muscle weakness (generalized): Secondary | ICD-10-CM | POA: Diagnosis not present

## 2018-05-06 DIAGNOSIS — M545 Low back pain: Secondary | ICD-10-CM | POA: Diagnosis not present

## 2018-05-06 DIAGNOSIS — G8929 Other chronic pain: Secondary | ICD-10-CM | POA: Diagnosis not present

## 2018-05-06 DIAGNOSIS — G8921 Chronic pain due to trauma: Secondary | ICD-10-CM | POA: Diagnosis not present

## 2018-05-08 DIAGNOSIS — R2689 Other abnormalities of gait and mobility: Secondary | ICD-10-CM | POA: Diagnosis not present

## 2018-05-08 DIAGNOSIS — M545 Low back pain: Secondary | ICD-10-CM | POA: Diagnosis not present

## 2018-05-08 DIAGNOSIS — G8929 Other chronic pain: Secondary | ICD-10-CM | POA: Diagnosis not present

## 2018-05-08 DIAGNOSIS — M25512 Pain in left shoulder: Secondary | ICD-10-CM | POA: Diagnosis not present

## 2018-05-08 DIAGNOSIS — G8921 Chronic pain due to trauma: Secondary | ICD-10-CM | POA: Diagnosis not present

## 2018-05-08 DIAGNOSIS — M25562 Pain in left knee: Secondary | ICD-10-CM | POA: Diagnosis not present

## 2018-05-08 DIAGNOSIS — G894 Chronic pain syndrome: Secondary | ICD-10-CM | POA: Diagnosis not present

## 2018-05-08 DIAGNOSIS — M6281 Muscle weakness (generalized): Secondary | ICD-10-CM | POA: Diagnosis not present

## 2018-05-13 DIAGNOSIS — G8921 Chronic pain due to trauma: Secondary | ICD-10-CM | POA: Diagnosis not present

## 2018-05-13 DIAGNOSIS — M25512 Pain in left shoulder: Secondary | ICD-10-CM | POA: Diagnosis not present

## 2018-05-13 DIAGNOSIS — G894 Chronic pain syndrome: Secondary | ICD-10-CM | POA: Diagnosis not present

## 2018-05-13 DIAGNOSIS — G8929 Other chronic pain: Secondary | ICD-10-CM | POA: Diagnosis not present

## 2018-05-13 DIAGNOSIS — M6281 Muscle weakness (generalized): Secondary | ICD-10-CM | POA: Diagnosis not present

## 2018-05-13 DIAGNOSIS — M25562 Pain in left knee: Secondary | ICD-10-CM | POA: Diagnosis not present

## 2018-05-13 DIAGNOSIS — M545 Low back pain: Secondary | ICD-10-CM | POA: Diagnosis not present

## 2018-05-13 DIAGNOSIS — R2689 Other abnormalities of gait and mobility: Secondary | ICD-10-CM | POA: Diagnosis not present

## 2018-05-15 ENCOUNTER — Other Ambulatory Visit: Payer: Self-pay | Admitting: Gastroenterology

## 2018-05-15 DIAGNOSIS — M545 Low back pain: Secondary | ICD-10-CM | POA: Diagnosis not present

## 2018-05-15 DIAGNOSIS — M25512 Pain in left shoulder: Secondary | ICD-10-CM | POA: Diagnosis not present

## 2018-05-15 DIAGNOSIS — G8929 Other chronic pain: Secondary | ICD-10-CM | POA: Diagnosis not present

## 2018-05-15 DIAGNOSIS — G8921 Chronic pain due to trauma: Secondary | ICD-10-CM | POA: Diagnosis not present

## 2018-05-15 DIAGNOSIS — G894 Chronic pain syndrome: Secondary | ICD-10-CM | POA: Diagnosis not present

## 2018-05-15 DIAGNOSIS — R2689 Other abnormalities of gait and mobility: Secondary | ICD-10-CM | POA: Diagnosis not present

## 2018-05-15 DIAGNOSIS — M6281 Muscle weakness (generalized): Secondary | ICD-10-CM | POA: Diagnosis not present

## 2018-05-15 DIAGNOSIS — M25562 Pain in left knee: Secondary | ICD-10-CM | POA: Diagnosis not present

## 2018-05-16 ENCOUNTER — Encounter: Payer: Self-pay | Admitting: Internal Medicine

## 2018-05-17 ENCOUNTER — Other Ambulatory Visit: Payer: Self-pay

## 2018-05-17 DIAGNOSIS — N3020 Other chronic cystitis without hematuria: Secondary | ICD-10-CM | POA: Diagnosis not present

## 2018-05-20 ENCOUNTER — Ambulatory Visit (INDEPENDENT_AMBULATORY_CARE_PROVIDER_SITE_OTHER): Payer: Medicare Other | Admitting: Internal Medicine

## 2018-05-20 ENCOUNTER — Encounter: Payer: Self-pay | Admitting: Internal Medicine

## 2018-05-20 VITALS — BP 122/74 | HR 76 | Resp 16 | Ht 63.0 in

## 2018-05-20 DIAGNOSIS — R413 Other amnesia: Secondary | ICD-10-CM | POA: Diagnosis not present

## 2018-05-20 DIAGNOSIS — G2581 Restless legs syndrome: Secondary | ICD-10-CM

## 2018-05-20 DIAGNOSIS — D649 Anemia, unspecified: Secondary | ICD-10-CM | POA: Diagnosis not present

## 2018-05-20 DIAGNOSIS — I10 Essential (primary) hypertension: Secondary | ICD-10-CM

## 2018-05-20 DIAGNOSIS — E1142 Type 2 diabetes mellitus with diabetic polyneuropathy: Secondary | ICD-10-CM

## 2018-05-20 LAB — HEMOGLOBIN A1C: Hgb A1c MFr Bld: 6.5 % (ref 4.6–6.5)

## 2018-05-20 LAB — BASIC METABOLIC PANEL
BUN: 20 mg/dL (ref 6–23)
CO2: 26 mEq/L (ref 19–32)
Calcium: 9.3 mg/dL (ref 8.4–10.5)
Chloride: 104 mEq/L (ref 96–112)
Creatinine, Ser: 1.27 mg/dL — ABNORMAL HIGH (ref 0.40–1.20)
GFR: 42.93 mL/min — ABNORMAL LOW (ref >60.00)
Glucose, Bld: 141 mg/dL — ABNORMAL HIGH (ref 70–99)
Potassium: 4.5 mEq/L (ref 3.5–5.1)
Sodium: 140 mEq/L (ref 135–145)

## 2018-05-20 LAB — HEMOGLOBIN: Hemoglobin: 10.7 g/dL — ABNORMAL LOW (ref 12.0–15.0)

## 2018-05-20 MED ORDER — FERROUS SULFATE 325 (65 FE) MG PO TABS: 325.0000 mg | ORAL_TABLET | Freq: Two times a day (BID) | ORAL | 6 refills | Status: DC

## 2018-05-20 MED ORDER — PRAMIPEXOLE DIHYDROCHLORIDE 0.125 MG PO TABS: 0.1250 mg | ORAL_TABLET | Freq: Every evening | ORAL | 3 refills | Status: DC | PRN

## 2018-05-20 MED ORDER — HYDROCODONE-ACETAMINOPHEN 5-325 MG PO TABS: 1.0000 | ORAL_TABLET | Freq: Four times a day (QID) | ORAL | 0 refills | Status: DC | PRN

## 2018-05-20 NOTE — Telephone Encounter (Signed)
Pt is agreeable to start [place] can you order please?

## 2018-05-20 NOTE — Progress Notes (Signed)
Subjective:    Patient ID: [name], female    DOB: [birth year], 83 y.o.   MRN: [id number]  DOS:  05/20/2018 Type of visit - description : rov Interval history: Today we review  her chronic medical problems. Also, thinks he may have a problem with memory, she sometimes is forgetful, her grandson is here and he feels like she is doing great with nothing out of ordinary.  She is very functional, does not pay her bills by choice, that task is done by her daughter for the last few years. RLS?  Noted some difficulty at night, she needs to keep moving her left leg, she is a right leg amputee. PVD, sees vascular surgery regularly DJD: Needs a refill of hydrocodone.   Review of Systems  Denies chest pain or difficulty breathing No nausea, vomiting.  She has occasional diarrhea, from time to time sees blood in the stools.  Past Medical History:  Diagnosis Date  . Abnormal echocardiogram    09/2013: mild LVH, mild focal basal hypertrophy of septum, EF 60-65%, normal WM, grade 1 diastolic dysfunction, MAC, mild LAE, ASA, PASP 34. Possible oscillating MV density - reviewed by MD and felt there was no significant abnormality other than MAC noted with mitral valve and did not require TEE.  . Adenomatous colon polyp   . Allergic rhinitis   . Arthritis    "back; ankles; hands; knees" (10/31/2013)  . Bilateral sensorineural hearing loss   . Bladder infection    frequently  . CAD (coronary artery disease)    a. Nonobst in 2011. b. Abnormal nuc 09/2013 -> s/p cutting balloon to D2, mild LAD disease; c. 08/2015 MV: no ischemia, EF 71%.  . Cancer ([place])    cancerous polyps  . Carcinoma in situ in a polyp 1994   a. 1994 - malignant polyp removed during colonoscopy.  . Charcot's Foot    a.  01/2016 s/p RLE transtibial amputation 2/2 Charcot rocker-bottom deformity and insensate neuropathy ulceration.  . Chronic diastolic CHF (congestive heart failure) ([place])    a. 09/2013 Echo: EF 60-65%, mild LVH,  Gr1 DD.  Marland Kitchen Diverticulosis   . DJD (degenerative joint disease)   . DVT (deep venous thrombosis) ([place]) 12/2007  . Dyspnea    a. Chronic, extensive w/u see OV note 09-2010. b. [place] 2011: ormal with RA 5 RV 31/2 PA 27/12 (19) PCW 10 CO normal. No evidence of shunting with sitting up in cath lab. c. CPX 2011: see report. d. Prior fluoro of diaphragm -  R diaphragm elevated at rest but both moved with inspiration.   . Fatty liver   . GERD (gastroesophageal reflux disease)    a. Hx GERD/esophageal dysmotility followed by Dr. Olevia Perches.   Marland Kitchen Head injury due to trauma   . Headache    Optic migraine  . Heart attack ([place])    2017  . Hemorrhoids   . Hiatal hernia    a. s/p Nissen fundoplication 4332.  Marland Kitchen Hyperlipidemia    a. patient unwilling to use statins.  . Hypertensive heart disease   . IBS (irritable bowel syndrome)   . Macular degeneration 03/2009   Dr. Rosana Hoes  . Macular degeneration   . Myocardial infarction ([place])   . Neuropathy    a. Hands, feet, legs.  . Orthostatic hypotension   . Osteomyelitis ([place])    a. 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 01/2016 s/p RLE transtibial amputation 2/2 Charcot  rocker-bottom deformity and insensate neuropathy ulceration.  . Osteoporosis   . PE (pulmonary embolism) 12/2007   a. PE/DVT after neck surgery 2009. b. coumadin d/c 10-2008.  Marland Kitchen Pneumonia 2015ish  . PONV (postoperative nausea and vomiting) 2009   neck surgery  . PPD positive   . Pulmonary nodule    incidental per CT:  Pet scan 4-9: likely benign, CT 08-2009 no change, no further CTs (Dr. Gwenette Greet)  . Recurrent UTI   . RSD (reflex sympathetic dystrophy)    a. Chronic pain.  Marland Kitchen Spinal stenosis   . Type II diabetes mellitus ([facility])    no on medication  . Venous insufficiency    a. Contributing to LEE.    Past Surgical History:  Procedure Laterality Date  . AMPUTATION  03/06/2012   Procedure: AMPUTATION FOOT;  Surgeon: Newt Minion, MD;   Location: [facility];  Service: Orthopedics;  Laterality: Left;  FIFTH RAY AMPUTATION   . AMPUTATION Right 02/18/2016   Procedure: AMPUTATION BELOW KNEE;  Surgeon: Newt Minion, MD;  Location: [facility];  Service: Orthopedics;  Laterality: Right;  . AMPUTATION Left 11/22/2016   Procedure: Amputation 4th Toe Left Foot at Metatarsophalangeal Joint;  Surgeon: Newt Minion, MD;  Location: [facility];  Service: Orthopedics;  Laterality: Left;  . ANKLE FUSION  09/27/2012   Procedure: ANKLE FUSION;  Surgeon: Newt Minion, MD;  Location: [facility];  Service: Orthopedics;  Laterality: Left;  Left Tibiocalcaneal Fusion  . ANKLE FUSION Right 05/09/2013   Procedure: ANKLE FUSION;  Surgeon: Newt Minion, MD;  Location: [facility];  Service: Orthopedics;  Laterality: Right;  Excision Osteomyelitis Base 1st MT Right Foot, Fusion Medial Column  . ANKLE SURGERY Left apr & june 1991  . ANTERIOR CERVICAL DECOMP/DISCECTOMY FUSION  12/18/07   For OA,  Dr. Lorin Mercy:  fu by a PE  . CARDIAC CATHETERIZATION  05/2010    at [facility]  . CARPAL TUNNEL RELEASE Left 09/2011  . CATARACT EXTRACTION W/ INTRAOCULAR LENS  IMPLANT, BILATERAL  2004   feb 2004 left, aug 2004 right  . CHOLECYSTECTOMY  03/2002  . COLONOSCOPY     numerous times  . CORONARY ANGIOPLASTY  10/31/2013  . CORONARY ARTERY BYPASS GRAFT  09/27/2016  . CORONARY ARTERY BYPASS GRAFT  09/2016   in [place].  . CYST REMOVAL HAND  06/2003  . FOOT BONE EXCISION Right 06/2009  . LAPAROSCOPIC RIGHT HEMI COLECTOMY N/A 01/08/2015   Procedure: LAPAROSCOPIC ASSISTED RIGHT HEMI COLECTOMY;  Surgeon: Johnathan Hausen, MD;  Location: [facility];  Service: General;  Laterality: N/A;  . LEFT HEART CATHETERIZATION WITH CORONARY ANGIOGRAM N/A 10/31/2013   Procedure: LEFT HEART CATHETERIZATION WITH CORONARY ANGIOGRAM;  Surgeon: Jettie Booze, MD;  Location: [facility] CATH LAB;  Service: Cardiovascular;  Laterality: N/A;  . NASAL SEPTUM SURGERY  09/1963  . NISSEN FUNDOPLICATION  6/0/7371  . PERCUTANEOUS CORONARY  INTERVENTION-BALLOON ONLY  10/31/2013   Procedure: PERCUTANEOUS CORONARY INTERVENTION-BALLOON ONLY;  Surgeon: Jettie Booze, MD;  Location: [facility] CATH LAB;  Service: Cardiovascular;;  . ROTATOR CUFF REPAIR Right 07/2007   Dr. Percell Miller  . ROTATOR CUFF REPAIR Left 11/2004  . stent in left leg, behind knee  06/27/2017   x2, done at [facility] in [facility]  . TOE AMPUTATION Right 08/2008   3rd toe, Dr. Sharol Given due to osteomyelitis  . VAGINAL HYSTERECTOMY  03/1975  . VEIN LIGATION Bilateral 03/1966  . VENA CAVA FILTER PLACEMENT  01/2010   green filter; "  due to blood clots"  . WISDOM TOOTH EXTRACTION  06/2007    Social History   Socioeconomic History  . Marital status: Divorced    Spouse name: Not on file  . Number of children: 3  . Years of education: 50  . Highest education level: Not on file  Occupational History  . Occupation: Retired     Comment: from Orth[employer]   Social Needs  . Financial resource strain: Not on file  . Food insecurity:    Worry: Not on file    Inability: Not on file  . Transportation needs:    Medical: Not on file    Non-medical: Not on file  Tobacco Use  . Smoking status: Never Smoker  . Smokeless tobacco: Never Used  . Tobacco comment: tried for a few months in college.   Substance and Sexual Activity  . Alcohol use: No    Alcohol/week: 0.0 oz  . Drug use: No  . Sexual activity: Not Currently    Birth control/protection: Post-menopausal  Lifestyle  . Physical activity:    Days per week: Not on file    Minutes per session: Not on file  . Stress: Not on file  Relationships  . Social connections:    Talks on phone: Not on file    Gets together: Not on file    Attends religious service: Not on file    Active member of club or organization: Not on file    Attends meetings of clubs or organizations: Not on file    Relationship status: Not on file  . Intimate partner violence:    Fear of current or ex partner: Not on file    Emotionally  abused: Not on file    Physically abused: Not on file    Forced sexual activity: Not on file  Other Topics Concern  . Not on file  Social History Narrative   Daughter lives w/ her and another daughter lives in town   1 son in [place]   Son comes home every 4 weeks to visit   Caffeine use:  Tea 1/day w/ dinner   Coffee occass             Allergies as of 05/20/2018      Reactions   Cymbalta [duloxetine Hcl] Swelling   Swelling in legs   Gabapentin Swelling   Swelling in legs Swelling in legs   Silicone Hives, Itching, Dermatitis, Rash   Metformin And Related Other (See Comments)   dizzy, tired, chills, diarrhea, and nausea   Adhesive [tape] Rash   rash rash   Cefazolin Hives   Hives Hives   Ciprofloxacin Other (See Comments)   Other reaction(s): Other (See Comments) Per pt, caused body aches Per pt, caused body aches   Clorazepate Dipotassium Other (See Comments)   Unknown reaction   Dilaudid [hydromorphone Hcl] Other (See Comments)    confused, intense itching   Doxycycline Rash   Enablex [darifenacin Hydrobromide Er] Other (See Comments)   Hypotension, near syncope   Levofloxacin Other (See Comments)   Causes wrist pain   Lyrica [pregabalin] Swelling   Swelling in legs   Methadone Hcl Other (See Comments)   Reaction unknown   Morphine And Related Other (See Comments)   Confusion, constipation.   Talwin [pentazocine] Other (See Comments)   "climbing walls" anxiety      Medication List        Accurate as of 05/20/18 11:59 PM. Always use your most recent med  list.          Alpha-Lipoic Acid 600 MG Caps Take 600 mg by mouth 2 (two) times daily.   amitriptyline 10 MG tablet Commonly known as:  ELAVIL Take 2 tablets (20 mg total) by mouth at bedtime.   carvedilol 25 MG tablet Commonly known as:  COREG Take 25 mg by mouth 2 (two) times daily with a meal.   ciprofloxacin 500 MG tablet Commonly known as:  CIPRO Take 500 mg by mouth 2 (two) times daily.     clopidogrel 75 MG tablet Commonly known as:  PLAVIX Take 75 mg by mouth at bedtime.   diclofenac sodium 1 % Gel Commonly known as:  VOLTAREN APPLY 2-4 GRAMS TO AFFECTED AREA 3 TIMES DAILY AS NEEDED   ferrous sulfate 325 (65 FE) MG tablet Take 1 tablet (325 mg total) by mouth 2 (two) times daily before a meal.   freestyle lancets CHECK BLOOD SUGAR TWICE A DAY   FREESTYLE LITE test strip Generic drug:  glucose blood USE TO CHECK BLOOD SUGAR NO MORE THAN TWICE DAILY   furosemide 20 MG tablet Commonly known as:  LASIX Take 20 mg by mouth daily.   HYDROcodone-acetaminophen 5-325 MG tablet Commonly known as:  NORCO/VICODIN Take 1 tablet by mouth every 6 (six) hours as needed for moderate pain.   hydrocortisone 2.5 % rectal cream Commonly known as:  ANUSOL-HC Place 1 application rectally 2 (two) times daily.   hydrocortisone 25 MG suppository Commonly known as:  ANUSOL-HC Place 1 suppository (25 mg total) rectally every 12 (twelve) hours.   lidocaine 5 % Commonly known as:  LIDODERM Place 1 patch onto the skin daily. Remove & Discard patch within 12 hours or as directed by MD   meclizine 25 MG tablet Commonly known as:  ANTIVERT Take 1 tablet (25 mg total) by mouth 2 (two) times daily as needed for dizziness.   nitroGLYCERIN 0.4 MG SL tablet Commonly known as:  NITROSTAT Place 0.4 mg under the tongue every 5 (five) minutes as needed for chest pain (x 3 doses). Reported on 04/14/2016   omeprazole 20 MG capsule Commonly known as:  PRILOSEC TAKE ONE CAPSULE BY MOUTH TWICE DAILY   ondansetron 4 MG disintegrating tablet Commonly known as:  ZOFRAN-ODT TAKE 1 TABLET BY MOUTH EVERY 8 HOURS AS NEEDED FOR NAUSEA OR VOMITING   OVER THE COUNTER MEDICATION 1 tablet 3 (three) times daily. [place] Gluco Defense   OVER THE COUNTER MEDICATION Take 1 capsule by mouth 2 (two) times daily. Integrative Digestive Formula   pramipexole 0.125 MG tablet Commonly known as:   MIRAPEX Take 1-2 tablets (0.125-0.25 mg total) by mouth at bedtime as needed.   PRESERVISION AREDS 2 Caps Take 1 capsule by mouth 2 (two) times daily.   REFRESH OP Place 1 drop into both eyes 3 (three) times daily as needed (dry eyes).   Vitamin B-12 500 MCG Subl Place 500 mcg under the tongue daily.   Vitamin D3 5000 units Caps Take 5,000 Units by mouth every evening. Reported on 11/24/2015   XARELTO 2.5 MG Tabs tablet Generic drug:  rivaroxaban Take 2.5 mg by mouth 2 (two) times daily.          Objective:   Physical Exam BP 122/74 (BP Location: Right Arm, Patient Position: Sitting, Cuff Size: Small)   Pulse 76   Resp 16   Ht _0  (1.6 m)   SpO2 97%   BMI 34.80 kg/m  General:   Well developed,  NAD, see BMI.  HEENT:  Normocephalic . Face symmetric, atraumatic Lungs:  CTA B Normal respiratory effort, no intercostal retractions, no accessory muscle use. Heart: RRR,  no murmur.  No pretibial edema left leg Skin: Not pale. Not jaundice Neurologic:  alert & oriented X3.  Speech normal, gait not tested, sits in a wheelchair Psych--  Cognition and judgment appear intact.  Cooperative with normal attention span and concentration.  Behavior appropriate. No anxious or depressed appearing.      Assessment & Plan:   Assessment DM, Neuropathy, amputations due to Charcot feet. - metformin intolerant  (lethargic) HTN  Hyperlipidemia-- won't take statins  LUNG MASS -incidental per CT 2009,  PET scan 01-2008: likely benign, CT 08-2009 no change, no further CTs per Dr Gwenette Greet - Had a CT in [place] 09-2016 "spiculated mass", saw Dr Lamonte Sakai,  CT  09/2017 stable CV: ---CAD sees Dr Meda Coffee and a cardiologist in [facility] --NSTEMI 09-2016. Outside hospital: Cath 2 vessel disease, CABG 2, LIMA to LAD and Diag   ---Chronic diastolic CHF ---PVD: Aortogram and a stent L Leg Dr Feliberto Gottron, 06-27-2017 @ [facility] (per pt) GI: GERD, IBS, colon polyps, PUD, abdominal pain "post polypectomy  syndrome" naueas meds prn (antivert-zofran, gets nausea when car traveling) Osteoporosis  MSK,pain mngmt : --Reflex sympathetic dystrophy --Neuropathy --DJD --Spinal stenosis  --H/o Osteomyelitis  Foot --s/p amputation:  4th 5th L toes , R BKA  (01-2016, dx osteomyelitis) w/ phantom pain  -- sees Dr Sharol Given prn Takes nortriptyline also for leg cramps. She had local injections with Dr. Maia Petties 2016 w/o apparent help. She took oxycodone after surgery and that did NOT seem to help better than hydrocodone. Intolerant to Cymbalta, Neurontin ; Lyrica helped but caused edema. Recurrent UTIs -- bactrim q d Venous insufficiency  H/o  + PPD H/o hypothyroidism: TSHs stable H/o dyspnea  PLAN:  DM: Diet controlled, check a A1c HTN: Continue carvedilol, Lasix, check a BMP PVD: Closely follow-up by vascular surgery Hemorrhoids: Has problems with rectal bleeding, saw with GI 01/2018, she was noted to have a colonoscopy 12-2015 prescribed topical treatments. Memory problems?  Doubt she has dementia, at most she is a slightly forgetful.  Recommend observation.  Declined a referral for complete mental examination. RLS?  Symptoms do suggest that, trial with Mirapex, also has low iron.  See next Anemia: Hemoglobin 10.5, will recheck.  Last iron and ferritin also low.  She is taking OTC iron,  Rec  iron 325 mg 1 tablet twice daily along with a vitamin C on a empty stomach. Osteoporosis: Pending Prolia, will try to set up. Social: Still living in [place] with her son.  She is here today with her grandson. RTC 3 - 4 months

## 2018-05-20 NOTE — Progress Notes (Signed)
Pre visit review using our clinic review tool, if applicable. No additional management support is needed unless otherwise documented below in the visit note. 

## 2018-05-20 NOTE — Patient Instructions (Addendum)
GO TO THE LAB : Get the blood work     GO TO THE FRONT DESK Schedule your next appointment for a checkup in 3 to 4 months  RLS ------ try Mirapex: 1 tablet 2 hours before bedtime, okay to increase to 2 tablets if needed.  Take iron twice a day on a empty stomach along with OTC vitamin C to enhance absorption of iron.

## 2018-05-21 NOTE — Telephone Encounter (Signed)
Thanks. Pt will get the flu shot when she comes in Nov.

## 2018-05-21 NOTE — Assessment & Plan Note (Signed)
DM: Diet controlled, check a A1c HTN: Continue carvedilol, Lasix, check a BMP PVD: Closely follow-up by vascular surgery Hemorrhoids: Has problems with rectal bleeding, saw with GI 01/2018, she was noted to have a colonoscopy 12-2015 prescribed topical treatments. Memory problems?  Doubt she has dementia, at most she is a slightly forgetful.  Recommend observation.  Declined a referral for complete mental examination. RLS?  Symptoms do suggest that, trial with Mirapex, also has low iron.  See next Anemia: Hemoglobin 10.5, will recheck.  Last iron and ferritin also low.  She is taking OTC iron,  Rec  iron 325 mg 1 tablet twice daily along with a vitamin C on a empty stomach. Osteoporosis: Pending Prolia, will try to set up. Social: Still living in [place] with her son.  She is here today with her grandson. RTC 3 - 4 months

## 2018-05-21 NOTE — Telephone Encounter (Signed)
Please call Pt- she lives in [place] with her son- but comes to office frequently- we have received her Prolia- please schedule nurse visit at her convenience (she was just here yesterday)- okay to wait several weeks to start if needed. Thank you.

## 2018-05-21 NOTE — Telephone Encounter (Signed)
Pt is scheduled for 06/14/18 @ 10:15. Pt is asking can she have flu shot as well on that day?

## 2018-05-21 NOTE — Telephone Encounter (Signed)
Medication is in fridge for pt. 

## 2018-05-21 NOTE — Telephone Encounter (Signed)
Unsure if we will have flu shots in. Recommend to wait until mid-October for flu shots so they will last full season.

## 2018-05-22 DIAGNOSIS — M545 Low back pain: Secondary | ICD-10-CM | POA: Diagnosis not present

## 2018-05-22 DIAGNOSIS — R2689 Other abnormalities of gait and mobility: Secondary | ICD-10-CM | POA: Diagnosis not present

## 2018-05-22 DIAGNOSIS — G894 Chronic pain syndrome: Secondary | ICD-10-CM | POA: Diagnosis not present

## 2018-05-22 DIAGNOSIS — M25562 Pain in left knee: Secondary | ICD-10-CM | POA: Diagnosis not present

## 2018-05-22 DIAGNOSIS — G8921 Chronic pain due to trauma: Secondary | ICD-10-CM | POA: Diagnosis not present

## 2018-05-22 DIAGNOSIS — M25512 Pain in left shoulder: Secondary | ICD-10-CM | POA: Diagnosis not present

## 2018-05-22 DIAGNOSIS — G8929 Other chronic pain: Secondary | ICD-10-CM | POA: Diagnosis not present

## 2018-05-22 DIAGNOSIS — M6281 Muscle weakness (generalized): Secondary | ICD-10-CM | POA: Diagnosis not present

## 2018-05-24 DIAGNOSIS — M25512 Pain in left shoulder: Secondary | ICD-10-CM | POA: Diagnosis not present

## 2018-05-24 DIAGNOSIS — R2689 Other abnormalities of gait and mobility: Secondary | ICD-10-CM | POA: Diagnosis not present

## 2018-05-24 DIAGNOSIS — G8929 Other chronic pain: Secondary | ICD-10-CM | POA: Diagnosis not present

## 2018-05-24 DIAGNOSIS — G894 Chronic pain syndrome: Secondary | ICD-10-CM | POA: Diagnosis not present

## 2018-05-24 DIAGNOSIS — M6281 Muscle weakness (generalized): Secondary | ICD-10-CM | POA: Diagnosis not present

## 2018-05-24 DIAGNOSIS — G8921 Chronic pain due to trauma: Secondary | ICD-10-CM | POA: Diagnosis not present

## 2018-05-24 DIAGNOSIS — M545 Low back pain: Secondary | ICD-10-CM | POA: Diagnosis not present

## 2018-05-24 DIAGNOSIS — M25562 Pain in left knee: Secondary | ICD-10-CM | POA: Diagnosis not present

## 2018-05-28 DIAGNOSIS — R300 Dysuria: Secondary | ICD-10-CM | POA: Diagnosis not present

## 2018-05-28 DIAGNOSIS — R110 Nausea: Secondary | ICD-10-CM | POA: Diagnosis not present

## 2018-05-30 DIAGNOSIS — G8929 Other chronic pain: Secondary | ICD-10-CM | POA: Diagnosis not present

## 2018-05-30 DIAGNOSIS — R2689 Other abnormalities of gait and mobility: Secondary | ICD-10-CM | POA: Diagnosis not present

## 2018-05-30 DIAGNOSIS — G894 Chronic pain syndrome: Secondary | ICD-10-CM | POA: Diagnosis not present

## 2018-05-30 DIAGNOSIS — M25562 Pain in left knee: Secondary | ICD-10-CM | POA: Diagnosis not present

## 2018-05-30 DIAGNOSIS — M25512 Pain in left shoulder: Secondary | ICD-10-CM | POA: Diagnosis not present

## 2018-05-30 DIAGNOSIS — M6281 Muscle weakness (generalized): Secondary | ICD-10-CM | POA: Diagnosis not present

## 2018-05-30 DIAGNOSIS — M545 Low back pain: Secondary | ICD-10-CM | POA: Diagnosis not present

## 2018-05-30 DIAGNOSIS — G8921 Chronic pain due to trauma: Secondary | ICD-10-CM | POA: Diagnosis not present

## 2018-05-31 DIAGNOSIS — E871 Hypo-osmolality and hyponatremia: Secondary | ICD-10-CM | POA: Diagnosis not present

## 2018-05-31 DIAGNOSIS — I2510 Atherosclerotic heart disease of native coronary artery without angina pectoris: Secondary | ICD-10-CM | POA: Diagnosis not present

## 2018-05-31 DIAGNOSIS — N3000 Acute cystitis without hematuria: Secondary | ICD-10-CM | POA: Diagnosis not present

## 2018-05-31 DIAGNOSIS — E119 Type 2 diabetes mellitus without complications: Secondary | ICD-10-CM | POA: Diagnosis not present

## 2018-05-31 DIAGNOSIS — B961 Klebsiella pneumoniae [K. pneumoniae] as the cause of diseases classified elsewhere: Secondary | ICD-10-CM | POA: Diagnosis not present

## 2018-05-31 DIAGNOSIS — N390 Urinary tract infection, site not specified: Secondary | ICD-10-CM | POA: Diagnosis not present

## 2018-05-31 DIAGNOSIS — G9050 Complex regional pain syndrome I, unspecified: Secondary | ICD-10-CM | POA: Diagnosis not present

## 2018-05-31 DIAGNOSIS — N200 Calculus of kidney: Secondary | ICD-10-CM | POA: Diagnosis not present

## 2018-06-01 DIAGNOSIS — B961 Klebsiella pneumoniae [K. pneumoniae] as the cause of diseases classified elsewhere: Secondary | ICD-10-CM | POA: Diagnosis present

## 2018-06-01 DIAGNOSIS — Z89511 Acquired absence of right leg below knee: Secondary | ICD-10-CM | POA: Diagnosis not present

## 2018-06-01 DIAGNOSIS — Z7902 Long term (current) use of antithrombotics/antiplatelets: Secondary | ICD-10-CM | POA: Diagnosis not present

## 2018-06-01 DIAGNOSIS — Z86711 Personal history of pulmonary embolism: Secondary | ICD-10-CM | POA: Diagnosis not present

## 2018-06-01 DIAGNOSIS — Z951 Presence of aortocoronary bypass graft: Secondary | ICD-10-CM | POA: Diagnosis not present

## 2018-06-01 DIAGNOSIS — I10 Essential (primary) hypertension: Secondary | ICD-10-CM | POA: Diagnosis present

## 2018-06-01 DIAGNOSIS — Z7901 Long term (current) use of anticoagulants: Secondary | ICD-10-CM | POA: Diagnosis not present

## 2018-06-01 DIAGNOSIS — Z87440 Personal history of urinary (tract) infections: Secondary | ICD-10-CM | POA: Diagnosis not present

## 2018-06-01 DIAGNOSIS — E785 Hyperlipidemia, unspecified: Secondary | ICD-10-CM | POA: Diagnosis present

## 2018-06-01 DIAGNOSIS — E871 Hypo-osmolality and hyponatremia: Secondary | ICD-10-CM | POA: Diagnosis not present

## 2018-06-01 DIAGNOSIS — I2510 Atherosclerotic heart disease of native coronary artery without angina pectoris: Secondary | ICD-10-CM | POA: Diagnosis present

## 2018-06-01 DIAGNOSIS — M47816 Spondylosis without myelopathy or radiculopathy, lumbar region: Secondary | ICD-10-CM | POA: Diagnosis present

## 2018-06-01 DIAGNOSIS — G9050 Complex regional pain syndrome I, unspecified: Secondary | ICD-10-CM | POA: Diagnosis present

## 2018-06-01 DIAGNOSIS — Z95828 Presence of other vascular implants and grafts: Secondary | ICD-10-CM | POA: Diagnosis not present

## 2018-06-01 DIAGNOSIS — N200 Calculus of kidney: Secondary | ICD-10-CM | POA: Diagnosis not present

## 2018-06-01 DIAGNOSIS — E119 Type 2 diabetes mellitus without complications: Secondary | ICD-10-CM | POA: Diagnosis present

## 2018-06-01 DIAGNOSIS — N390 Urinary tract infection, site not specified: Secondary | ICD-10-CM | POA: Diagnosis not present

## 2018-06-01 DIAGNOSIS — N3000 Acute cystitis without hematuria: Secondary | ICD-10-CM | POA: Diagnosis not present

## 2018-06-01 DIAGNOSIS — N3090 Cystitis, unspecified without hematuria: Secondary | ICD-10-CM | POA: Diagnosis not present

## 2018-06-01 DIAGNOSIS — Z1624 Resistance to multiple antibiotics: Secondary | ICD-10-CM | POA: Diagnosis not present

## 2018-06-01 DIAGNOSIS — Z889 Allergy status to unspecified drugs, medicaments and biological substances status: Secondary | ICD-10-CM | POA: Diagnosis not present

## 2018-06-06 DIAGNOSIS — G8921 Chronic pain due to trauma: Secondary | ICD-10-CM | POA: Diagnosis not present

## 2018-06-06 DIAGNOSIS — R2689 Other abnormalities of gait and mobility: Secondary | ICD-10-CM | POA: Diagnosis not present

## 2018-06-06 DIAGNOSIS — M25512 Pain in left shoulder: Secondary | ICD-10-CM | POA: Diagnosis not present

## 2018-06-06 DIAGNOSIS — M25562 Pain in left knee: Secondary | ICD-10-CM | POA: Diagnosis not present

## 2018-06-06 DIAGNOSIS — G894 Chronic pain syndrome: Secondary | ICD-10-CM | POA: Diagnosis not present

## 2018-06-06 DIAGNOSIS — G8929 Other chronic pain: Secondary | ICD-10-CM | POA: Diagnosis not present

## 2018-06-06 DIAGNOSIS — M6281 Muscle weakness (generalized): Secondary | ICD-10-CM | POA: Diagnosis not present

## 2018-06-06 DIAGNOSIS — M545 Low back pain: Secondary | ICD-10-CM | POA: Diagnosis not present

## 2018-06-11 DIAGNOSIS — G8921 Chronic pain due to trauma: Secondary | ICD-10-CM | POA: Diagnosis not present

## 2018-06-11 DIAGNOSIS — R2689 Other abnormalities of gait and mobility: Secondary | ICD-10-CM | POA: Diagnosis not present

## 2018-06-11 DIAGNOSIS — M545 Low back pain: Secondary | ICD-10-CM | POA: Diagnosis not present

## 2018-06-11 DIAGNOSIS — M25562 Pain in left knee: Secondary | ICD-10-CM | POA: Diagnosis not present

## 2018-06-11 DIAGNOSIS — M25512 Pain in left shoulder: Secondary | ICD-10-CM | POA: Diagnosis not present

## 2018-06-11 DIAGNOSIS — G8929 Other chronic pain: Secondary | ICD-10-CM | POA: Diagnosis not present

## 2018-06-11 DIAGNOSIS — G894 Chronic pain syndrome: Secondary | ICD-10-CM | POA: Diagnosis not present

## 2018-06-11 DIAGNOSIS — M6281 Muscle weakness (generalized): Secondary | ICD-10-CM | POA: Diagnosis not present

## 2018-06-12 DIAGNOSIS — R531 Weakness: Secondary | ICD-10-CM | POA: Diagnosis not present

## 2018-06-12 DIAGNOSIS — I499 Cardiac arrhythmia, unspecified: Secondary | ICD-10-CM | POA: Diagnosis not present

## 2018-06-12 DIAGNOSIS — I2510 Atherosclerotic heart disease of native coronary artery without angina pectoris: Secondary | ICD-10-CM | POA: Diagnosis not present

## 2018-06-12 DIAGNOSIS — Z79899 Other long term (current) drug therapy: Secondary | ICD-10-CM | POA: Diagnosis not present

## 2018-06-12 DIAGNOSIS — R069 Unspecified abnormalities of breathing: Secondary | ICD-10-CM | POA: Diagnosis not present

## 2018-06-12 DIAGNOSIS — R0602 Shortness of breath: Secondary | ICD-10-CM | POA: Diagnosis not present

## 2018-06-12 DIAGNOSIS — E0789 Other specified disorders of thyroid: Secondary | ICD-10-CM | POA: Diagnosis not present

## 2018-06-12 DIAGNOSIS — Z885 Allergy status to narcotic agent status: Secondary | ICD-10-CM | POA: Diagnosis not present

## 2018-06-12 DIAGNOSIS — Z888 Allergy status to other drugs, medicaments and biological substances status: Secondary | ICD-10-CM | POA: Diagnosis not present

## 2018-06-12 DIAGNOSIS — I440 Atrioventricular block, first degree: Secondary | ICD-10-CM | POA: Diagnosis not present

## 2018-06-12 DIAGNOSIS — E1151 Type 2 diabetes mellitus with diabetic peripheral angiopathy without gangrene: Secondary | ICD-10-CM | POA: Diagnosis not present

## 2018-06-12 DIAGNOSIS — E1140 Type 2 diabetes mellitus with diabetic neuropathy, unspecified: Secondary | ICD-10-CM | POA: Diagnosis not present

## 2018-06-12 DIAGNOSIS — Z881 Allergy status to other antibiotic agents status: Secondary | ICD-10-CM | POA: Diagnosis not present

## 2018-06-12 DIAGNOSIS — I25118 Atherosclerotic heart disease of native coronary artery with other forms of angina pectoris: Secondary | ICD-10-CM | POA: Diagnosis not present

## 2018-06-12 DIAGNOSIS — J849 Interstitial pulmonary disease, unspecified: Secondary | ICD-10-CM | POA: Diagnosis not present

## 2018-06-12 DIAGNOSIS — E1159 Type 2 diabetes mellitus with other circulatory complications: Secondary | ICD-10-CM | POA: Diagnosis not present

## 2018-06-12 DIAGNOSIS — Z86711 Personal history of pulmonary embolism: Secondary | ICD-10-CM | POA: Diagnosis not present

## 2018-06-12 DIAGNOSIS — R0600 Dyspnea, unspecified: Secondary | ICD-10-CM | POA: Diagnosis not present

## 2018-06-12 DIAGNOSIS — Z7902 Long term (current) use of antithrombotics/antiplatelets: Secondary | ICD-10-CM | POA: Diagnosis not present

## 2018-06-12 DIAGNOSIS — E871 Hypo-osmolality and hyponatremia: Secondary | ICD-10-CM | POA: Diagnosis not present

## 2018-06-12 DIAGNOSIS — R079 Chest pain, unspecified: Secondary | ICD-10-CM | POA: Diagnosis not present

## 2018-06-12 DIAGNOSIS — Z89511 Acquired absence of right leg below knee: Secondary | ICD-10-CM | POA: Diagnosis not present

## 2018-06-12 DIAGNOSIS — I10 Essential (primary) hypertension: Secondary | ICD-10-CM | POA: Diagnosis not present

## 2018-06-12 DIAGNOSIS — R0789 Other chest pain: Secondary | ICD-10-CM | POA: Diagnosis not present

## 2018-06-12 DIAGNOSIS — K449 Diaphragmatic hernia without obstruction or gangrene: Secondary | ICD-10-CM | POA: Diagnosis not present

## 2018-06-12 DIAGNOSIS — R61 Generalized hyperhidrosis: Secondary | ICD-10-CM | POA: Diagnosis not present

## 2018-06-12 DIAGNOSIS — Z951 Presence of aortocoronary bypass graft: Secondary | ICD-10-CM | POA: Diagnosis not present

## 2018-06-12 DIAGNOSIS — E785 Hyperlipidemia, unspecified: Secondary | ICD-10-CM | POA: Diagnosis not present

## 2018-06-12 DIAGNOSIS — Z7901 Long term (current) use of anticoagulants: Secondary | ICD-10-CM | POA: Diagnosis not present

## 2018-06-12 DIAGNOSIS — R072 Precordial pain: Secondary | ICD-10-CM | POA: Diagnosis not present

## 2018-06-13 DIAGNOSIS — E1159 Type 2 diabetes mellitus with other circulatory complications: Secondary | ICD-10-CM | POA: Diagnosis not present

## 2018-06-13 DIAGNOSIS — I10 Essential (primary) hypertension: Secondary | ICD-10-CM | POA: Diagnosis not present

## 2018-06-13 DIAGNOSIS — I25118 Atherosclerotic heart disease of native coronary artery with other forms of angina pectoris: Secondary | ICD-10-CM | POA: Diagnosis not present

## 2018-06-13 DIAGNOSIS — R072 Precordial pain: Secondary | ICD-10-CM | POA: Diagnosis not present

## 2018-06-13 DIAGNOSIS — R079 Chest pain, unspecified: Secondary | ICD-10-CM | POA: Diagnosis not present

## 2018-06-14 ENCOUNTER — Telehealth: Payer: Self-pay | Admitting: Gastroenterology

## 2018-06-14 ENCOUNTER — Ambulatory Visit: Payer: Medicare Other

## 2018-06-14 NOTE — Telephone Encounter (Signed)
The pt has never seen Alonza Bogus for this issue.  I called the pt and the daughter states that the pt has had hernia surgery in the past and was told she has another hernia.  I advised her that the pt would need to follow up with the surgeon regarding hernia.  The pt daughter agreed

## 2018-06-14 NOTE — Telephone Encounter (Signed)
patient daughter says that patient had been taken to hospital in [facility] because they thought patient was having a heart attack. [name] says that patient did not have a heart attack but that attending physician said to call our office to schedule an appointment asap for hernia.  [name] is requesting to speak to a nurse.

## 2018-06-18 DIAGNOSIS — M25562 Pain in left knee: Secondary | ICD-10-CM | POA: Diagnosis not present

## 2018-06-18 DIAGNOSIS — G8921 Chronic pain due to trauma: Secondary | ICD-10-CM | POA: Diagnosis not present

## 2018-06-18 DIAGNOSIS — M6281 Muscle weakness (generalized): Secondary | ICD-10-CM | POA: Diagnosis not present

## 2018-06-18 DIAGNOSIS — R2689 Other abnormalities of gait and mobility: Secondary | ICD-10-CM | POA: Diagnosis not present

## 2018-06-18 DIAGNOSIS — M25512 Pain in left shoulder: Secondary | ICD-10-CM | POA: Diagnosis not present

## 2018-06-18 DIAGNOSIS — M545 Low back pain: Secondary | ICD-10-CM | POA: Diagnosis not present

## 2018-06-18 DIAGNOSIS — G8929 Other chronic pain: Secondary | ICD-10-CM | POA: Diagnosis not present

## 2018-06-18 DIAGNOSIS — G894 Chronic pain syndrome: Secondary | ICD-10-CM | POA: Diagnosis not present

## 2018-06-19 ENCOUNTER — Telehealth: Payer: Self-pay | Admitting: Internal Medicine

## 2018-06-19 ENCOUNTER — Other Ambulatory Visit: Payer: Self-pay | Admitting: Internal Medicine

## 2018-06-19 NOTE — Telephone Encounter (Signed)
Copied from [place] [id number]. Topic: General - Other >> Jun 19, 2018  1:07 PM Lennox Solders wrote: Reason for CRM: pt is calling and would like a refill on hydrocodone send to [facility] in [place]

## 2018-06-20 DIAGNOSIS — M25562 Pain in left knee: Secondary | ICD-10-CM | POA: Diagnosis not present

## 2018-06-20 DIAGNOSIS — G8929 Other chronic pain: Secondary | ICD-10-CM | POA: Diagnosis not present

## 2018-06-20 DIAGNOSIS — G894 Chronic pain syndrome: Secondary | ICD-10-CM | POA: Diagnosis not present

## 2018-06-20 DIAGNOSIS — M25512 Pain in left shoulder: Secondary | ICD-10-CM | POA: Diagnosis not present

## 2018-06-20 DIAGNOSIS — M545 Low back pain: Secondary | ICD-10-CM | POA: Diagnosis not present

## 2018-06-20 DIAGNOSIS — R2689 Other abnormalities of gait and mobility: Secondary | ICD-10-CM | POA: Diagnosis not present

## 2018-06-20 DIAGNOSIS — G8921 Chronic pain due to trauma: Secondary | ICD-10-CM | POA: Diagnosis not present

## 2018-06-20 DIAGNOSIS — M6281 Muscle weakness (generalized): Secondary | ICD-10-CM | POA: Diagnosis not present

## 2018-06-21 ENCOUNTER — Ambulatory Visit (INDEPENDENT_AMBULATORY_CARE_PROVIDER_SITE_OTHER): Payer: Medicare Other

## 2018-06-21 DIAGNOSIS — M810 Age-related osteoporosis without current pathological fracture: Secondary | ICD-10-CM | POA: Diagnosis not present

## 2018-06-21 DIAGNOSIS — Z1231 Encounter for screening mammogram for malignant neoplasm of breast: Secondary | ICD-10-CM | POA: Diagnosis not present

## 2018-06-21 LAB — HM MAMMOGRAPHY

## 2018-06-21 MED ORDER — DENOSUMAB 60 MG/ML ~~LOC~~ SOSY
60.0000 mg | PREFILLED_SYRINGE | Freq: Once | SUBCUTANEOUS | Status: AC
  Administered 2018-06-21: 60 mg via SUBCUTANEOUS

## 2018-06-21 NOTE — Progress Notes (Signed)
Pre visit review using our clinic tool,if applicable. No additional management support is needed unless otherwise documented below in the visit note.   Patient in for Prolia injection per Dr. Larose Kells dated 05/20/18.  Given 60 mg SQ left arm. Patient tolerated well.    Reminder card given for next injection due.

## 2018-06-25 DIAGNOSIS — R079 Chest pain, unspecified: Secondary | ICD-10-CM | POA: Diagnosis not present

## 2018-06-25 DIAGNOSIS — I2510 Atherosclerotic heart disease of native coronary artery without angina pectoris: Secondary | ICD-10-CM | POA: Diagnosis not present

## 2018-06-25 MED ORDER — HYDROCODONE-ACETAMINOPHEN 5-325 MG PO TABS: 1.0000 | ORAL_TABLET | Freq: Four times a day (QID) | ORAL | 0 refills | Status: DC | PRN

## 2018-06-25 NOTE — Telephone Encounter (Signed)
sent 

## 2018-06-25 NOTE — Telephone Encounter (Signed)
Last hydrocodone RX: 05/20/18, #120 Last OV: 05/20/18 Next OV: 09/06/18 UDS: 01/18/18, low risk CSC: 07/20/17 CSR: No discrepancies identified

## 2018-06-25 NOTE — Telephone Encounter (Signed)
Patient is calling to check the status of this order.

## 2018-06-27 ENCOUNTER — Encounter: Payer: Self-pay | Admitting: Internal Medicine

## 2018-06-27 DIAGNOSIS — N3020 Other chronic cystitis without hematuria: Secondary | ICD-10-CM | POA: Diagnosis not present

## 2018-06-27 DIAGNOSIS — E119 Type 2 diabetes mellitus without complications: Secondary | ICD-10-CM | POA: Diagnosis not present

## 2018-06-27 DIAGNOSIS — E782 Mixed hyperlipidemia: Secondary | ICD-10-CM | POA: Diagnosis not present

## 2018-06-27 DIAGNOSIS — I25728 Atherosclerosis of autologous artery coronary artery bypass graft(s) with other forms of angina pectoris: Secondary | ICD-10-CM | POA: Diagnosis not present

## 2018-06-27 DIAGNOSIS — N200 Calculus of kidney: Secondary | ICD-10-CM | POA: Diagnosis not present

## 2018-06-27 DIAGNOSIS — I10 Essential (primary) hypertension: Secondary | ICD-10-CM | POA: Diagnosis not present

## 2018-07-04 DIAGNOSIS — M25512 Pain in left shoulder: Secondary | ICD-10-CM | POA: Diagnosis not present

## 2018-07-04 DIAGNOSIS — M545 Low back pain: Secondary | ICD-10-CM | POA: Diagnosis not present

## 2018-07-04 DIAGNOSIS — G8921 Chronic pain due to trauma: Secondary | ICD-10-CM | POA: Diagnosis not present

## 2018-07-04 DIAGNOSIS — M6281 Muscle weakness (generalized): Secondary | ICD-10-CM | POA: Diagnosis not present

## 2018-07-04 DIAGNOSIS — G8929 Other chronic pain: Secondary | ICD-10-CM | POA: Diagnosis not present

## 2018-07-04 DIAGNOSIS — R2689 Other abnormalities of gait and mobility: Secondary | ICD-10-CM | POA: Diagnosis not present

## 2018-07-04 DIAGNOSIS — G894 Chronic pain syndrome: Secondary | ICD-10-CM | POA: Diagnosis not present

## 2018-07-04 DIAGNOSIS — M25562 Pain in left knee: Secondary | ICD-10-CM | POA: Diagnosis not present

## 2018-07-08 DIAGNOSIS — I70213 Atherosclerosis of native arteries of extremities with intermittent claudication, bilateral legs: Secondary | ICD-10-CM | POA: Diagnosis not present

## 2018-07-09 ENCOUNTER — Ambulatory Visit: Payer: Medicare Other | Admitting: Gastroenterology

## 2018-07-09 DIAGNOSIS — G8921 Chronic pain due to trauma: Secondary | ICD-10-CM | POA: Diagnosis not present

## 2018-07-09 DIAGNOSIS — G894 Chronic pain syndrome: Secondary | ICD-10-CM | POA: Diagnosis not present

## 2018-07-09 DIAGNOSIS — M25562 Pain in left knee: Secondary | ICD-10-CM | POA: Diagnosis not present

## 2018-07-09 DIAGNOSIS — M25512 Pain in left shoulder: Secondary | ICD-10-CM | POA: Diagnosis not present

## 2018-07-09 DIAGNOSIS — G8929 Other chronic pain: Secondary | ICD-10-CM | POA: Diagnosis not present

## 2018-07-09 DIAGNOSIS — M545 Low back pain: Secondary | ICD-10-CM | POA: Diagnosis not present

## 2018-07-09 DIAGNOSIS — R2689 Other abnormalities of gait and mobility: Secondary | ICD-10-CM | POA: Diagnosis not present

## 2018-07-09 DIAGNOSIS — M6281 Muscle weakness (generalized): Secondary | ICD-10-CM | POA: Diagnosis not present

## 2018-07-11 DIAGNOSIS — G8921 Chronic pain due to trauma: Secondary | ICD-10-CM | POA: Diagnosis not present

## 2018-07-11 DIAGNOSIS — G894 Chronic pain syndrome: Secondary | ICD-10-CM | POA: Diagnosis not present

## 2018-07-11 DIAGNOSIS — G8929 Other chronic pain: Secondary | ICD-10-CM | POA: Diagnosis not present

## 2018-07-11 DIAGNOSIS — M6281 Muscle weakness (generalized): Secondary | ICD-10-CM | POA: Diagnosis not present

## 2018-07-11 DIAGNOSIS — M25562 Pain in left knee: Secondary | ICD-10-CM | POA: Diagnosis not present

## 2018-07-11 DIAGNOSIS — M25512 Pain in left shoulder: Secondary | ICD-10-CM | POA: Diagnosis not present

## 2018-07-11 DIAGNOSIS — M545 Low back pain: Secondary | ICD-10-CM | POA: Diagnosis not present

## 2018-07-11 DIAGNOSIS — R2689 Other abnormalities of gait and mobility: Secondary | ICD-10-CM | POA: Diagnosis not present

## 2018-07-12 DIAGNOSIS — I70213 Atherosclerosis of native arteries of extremities with intermittent claudication, bilateral legs: Secondary | ICD-10-CM | POA: Diagnosis not present

## 2018-07-12 DIAGNOSIS — E119 Type 2 diabetes mellitus without complications: Secondary | ICD-10-CM | POA: Diagnosis not present

## 2018-07-12 DIAGNOSIS — I25728 Atherosclerosis of autologous artery coronary artery bypass graft(s) with other forms of angina pectoris: Secondary | ICD-10-CM | POA: Diagnosis not present

## 2018-07-12 DIAGNOSIS — I10 Essential (primary) hypertension: Secondary | ICD-10-CM | POA: Diagnosis not present

## 2018-07-16 DIAGNOSIS — G8929 Other chronic pain: Secondary | ICD-10-CM | POA: Diagnosis not present

## 2018-07-16 DIAGNOSIS — M545 Low back pain: Secondary | ICD-10-CM | POA: Diagnosis not present

## 2018-07-16 DIAGNOSIS — M6281 Muscle weakness (generalized): Secondary | ICD-10-CM | POA: Diagnosis not present

## 2018-07-16 DIAGNOSIS — G8921 Chronic pain due to trauma: Secondary | ICD-10-CM | POA: Diagnosis not present

## 2018-07-16 DIAGNOSIS — M25512 Pain in left shoulder: Secondary | ICD-10-CM | POA: Diagnosis not present

## 2018-07-16 DIAGNOSIS — M25562 Pain in left knee: Secondary | ICD-10-CM | POA: Diagnosis not present

## 2018-07-16 DIAGNOSIS — R2689 Other abnormalities of gait and mobility: Secondary | ICD-10-CM | POA: Diagnosis not present

## 2018-07-16 DIAGNOSIS — G894 Chronic pain syndrome: Secondary | ICD-10-CM | POA: Diagnosis not present

## 2018-07-19 DIAGNOSIS — M6281 Muscle weakness (generalized): Secondary | ICD-10-CM | POA: Diagnosis not present

## 2018-07-19 DIAGNOSIS — M25562 Pain in left knee: Secondary | ICD-10-CM | POA: Diagnosis not present

## 2018-07-19 DIAGNOSIS — M25512 Pain in left shoulder: Secondary | ICD-10-CM | POA: Diagnosis not present

## 2018-07-19 DIAGNOSIS — G8921 Chronic pain due to trauma: Secondary | ICD-10-CM | POA: Diagnosis not present

## 2018-07-19 DIAGNOSIS — G8929 Other chronic pain: Secondary | ICD-10-CM | POA: Diagnosis not present

## 2018-07-19 DIAGNOSIS — G894 Chronic pain syndrome: Secondary | ICD-10-CM | POA: Diagnosis not present

## 2018-07-19 DIAGNOSIS — R2689 Other abnormalities of gait and mobility: Secondary | ICD-10-CM | POA: Diagnosis not present

## 2018-07-19 DIAGNOSIS — M545 Low back pain: Secondary | ICD-10-CM | POA: Diagnosis not present

## 2018-07-23 DIAGNOSIS — G894 Chronic pain syndrome: Secondary | ICD-10-CM | POA: Diagnosis not present

## 2018-07-23 DIAGNOSIS — M545 Low back pain: Secondary | ICD-10-CM | POA: Diagnosis not present

## 2018-07-23 DIAGNOSIS — G8921 Chronic pain due to trauma: Secondary | ICD-10-CM | POA: Diagnosis not present

## 2018-07-23 DIAGNOSIS — G8929 Other chronic pain: Secondary | ICD-10-CM | POA: Diagnosis not present

## 2018-07-23 DIAGNOSIS — M6281 Muscle weakness (generalized): Secondary | ICD-10-CM | POA: Diagnosis not present

## 2018-07-23 DIAGNOSIS — R2689 Other abnormalities of gait and mobility: Secondary | ICD-10-CM | POA: Diagnosis not present

## 2018-07-23 DIAGNOSIS — M25512 Pain in left shoulder: Secondary | ICD-10-CM | POA: Diagnosis not present

## 2018-07-23 DIAGNOSIS — M25562 Pain in left knee: Secondary | ICD-10-CM | POA: Diagnosis not present

## 2018-07-24 DIAGNOSIS — G894 Chronic pain syndrome: Secondary | ICD-10-CM | POA: Diagnosis not present

## 2018-07-24 DIAGNOSIS — M6281 Muscle weakness (generalized): Secondary | ICD-10-CM | POA: Diagnosis not present

## 2018-07-24 DIAGNOSIS — M545 Low back pain: Secondary | ICD-10-CM | POA: Diagnosis not present

## 2018-07-24 DIAGNOSIS — G8929 Other chronic pain: Secondary | ICD-10-CM | POA: Diagnosis not present

## 2018-07-24 DIAGNOSIS — G8921 Chronic pain due to trauma: Secondary | ICD-10-CM | POA: Diagnosis not present

## 2018-07-24 DIAGNOSIS — M25562 Pain in left knee: Secondary | ICD-10-CM | POA: Diagnosis not present

## 2018-07-24 DIAGNOSIS — R2689 Other abnormalities of gait and mobility: Secondary | ICD-10-CM | POA: Diagnosis not present

## 2018-07-24 DIAGNOSIS — M25512 Pain in left shoulder: Secondary | ICD-10-CM | POA: Diagnosis not present

## 2018-07-26 ENCOUNTER — Telehealth: Payer: Self-pay | Admitting: Internal Medicine

## 2018-07-26 MED ORDER — HYDROCODONE-ACETAMINOPHEN 5-325 MG PO TABS: 1.0000 | ORAL_TABLET | Freq: Four times a day (QID) | ORAL | 0 refills | Status: DC | PRN

## 2018-07-26 NOTE — Telephone Encounter (Signed)
Pt is requesting refill on hydrocodone.   Last OV: 05/20/2018 Last Fill: 06/25/2018 #120 and 0RF UDS: 01/18/2018 Low risk

## 2018-07-26 NOTE — Telephone Encounter (Signed)
Copied from [place] [id number]. Topic: Quick Communication - Rx Refill/Question >> Jul 26, 2018  2:40 PM Alfredia Ferguson R wrote: Medication: HYDROcodone-acetaminophen (NORCO/VICODIN) 5-325 MG tablet  Has the patient contacted their pharmacy? Yes  Preferred Pharmacy (with phone number or street name): [facility] [place], [address] AT [address] [phone] (Phone) [phone] (Fax)    Agent: Please be advised that RX refills may take up to 3 business days. We ask that you follow-up with your pharmacy.

## 2018-07-26 NOTE — Telephone Encounter (Signed)
Sent!

## 2018-07-26 NOTE — Telephone Encounter (Signed)
Requested medication (s) are due for refill today: Yes  Requested medication (s) are on the active medication list: Yes  Last refill:  06/25/18  Future visit scheduled: Yes       Requested Prescriptions  Pending Prescriptions Disp Refills   HYDROcodone-acetaminophen (NORCO/VICODIN) 5-325 MG tablet 120 tablet 0    Sig: Take 1 tablet by mouth every 6 (six) hours as needed for moderate pain.     Not Delegated - Analgesics:  Opioid Agonist Combinations Failed - 07/26/2018  2:50 PM      Failed - This refill cannot be delegated      Failed - Urine Drug Screen completed in last 360 days.      Passed - Valid encounter within last 6 months    Recent Outpatient Visits          2 months ago Essential hypertension   [facility] [place], MD   6 months ago Essential hypertension   [facility] at [place], MD   9 months ago Urinary tract infection without hematuria, site unspecified   [facility] at [place], MD   1 year ago Essential hypertension   [facility] [place], MD   1 year ago Phantom pain [facility])   [facility] at [place], MD      Future Appointments            In 1 month [place], Alda Berthold, MD [employer] at [employer], [place]

## 2018-08-01 DIAGNOSIS — N3020 Other chronic cystitis without hematuria: Secondary | ICD-10-CM | POA: Diagnosis not present

## 2018-08-01 DIAGNOSIS — N200 Calculus of kidney: Secondary | ICD-10-CM | POA: Diagnosis not present

## 2018-08-13 ENCOUNTER — Encounter

## 2018-08-13 ENCOUNTER — Encounter: Payer: Self-pay | Admitting: Gastroenterology

## 2018-08-13 ENCOUNTER — Ambulatory Visit (INDEPENDENT_AMBULATORY_CARE_PROVIDER_SITE_OTHER): Payer: Medicare Other | Admitting: Gastroenterology

## 2018-08-13 VITALS — BP 142/86 | HR 84

## 2018-08-13 DIAGNOSIS — Z86010 Personal history of colonic polyps: Secondary | ICD-10-CM

## 2018-08-13 DIAGNOSIS — K642 Third degree hemorrhoids: Secondary | ICD-10-CM

## 2018-08-13 DIAGNOSIS — K219 Gastro-esophageal reflux disease without esophagitis: Secondary | ICD-10-CM | POA: Diagnosis not present

## 2018-08-13 DIAGNOSIS — Z7409 Other reduced mobility: Secondary | ICD-10-CM | POA: Diagnosis not present

## 2018-08-13 DIAGNOSIS — Z7901 Long term (current) use of anticoagulants: Secondary | ICD-10-CM

## 2018-08-13 DIAGNOSIS — K625 Hemorrhage of anus and rectum: Secondary | ICD-10-CM

## 2018-08-13 NOTE — Progress Notes (Addendum)
[name]    [id number]    17-May-1935  Primary Care Physician:Paz, Alda Berthold, MD  Referring Physician: Colon Branch, MD [address] [place], [place] [address]  Chief complaint: Rectal bleeding, regurgitation, heartburn HPI: 83 year old female with history of CAD, and STEMI status post CABG, peripheral vascular disease status post partial amputation of left lower leg with prosthesis on chronic antiplatelet and anticoagulation Still living in [place] with her son and visits [place], still has all her doctors here.  She is planning to move back to [place] once she completes physical therapy and rehab.  She continues to have intermittent rectal bleeding.  Constipation is worse since she started taking oral iron and has been straining excessively.  Hemorrhoids are protruding out and has small-volume bright red blood when she wipes.  She had recent hospitalization in [place] for UTI requiring IV antibiotics towards the end of August and last month was admitted for 2 days to rule out MI with chest pain. She is having intermittent heartburn and regurgitation worse when she lays down.  Colonoscopy March 2017 with internal and external hemorrhoids, left-sided diverticulosis, removal of 3 subcentimeter sessile and pedunculated polyps [tubular adenomas]   Outpatient Encounter Medications as of 08/13/2018  Medication Sig  . Alpha-Lipoic Acid 600 MG CAPS Take 600 mg by mouth 2 (two) times daily.  Marland Kitchen amitriptyline (ELAVIL) 10 MG tablet TAKE 2 TABLETS(20 MG) BY MOUTH AT BEDTIME  . carvedilol (COREG) 25 MG tablet Take 25 mg by mouth 2 (two) times daily with a meal.  . Cholecalciferol (VITAMIN D3) 5000 UNITS CAPS Take 5,000 Units by mouth every evening. Reported on 11/24/2015  . clopidogrel (PLAVIX) 75 MG tablet Take 75 mg by mouth at bedtime.   . Cyanocobalamin (VITAMIN B-12) 500 MCG SUBL Place 500 mcg under the tongue daily.  . diclofenac sodium (VOLTAREN) 1  % GEL APPLY 2-4 GRAMS TO AFFECTED AREA 3 TIMES DAILY AS NEEDED  . ferrous sulfate 325 (65 FE) MG tablet Take 1 tablet (325 mg total) by mouth 2 (two) times daily before a meal.  . FREESTYLE LITE test strip USE TO CHECK BLOOD SUGAR NO MORE THAN TWICE DAILY  . furosemide (LASIX) 20 MG tablet Take 20 mg by mouth daily.  Marland Kitchen HYDROcodone-acetaminophen (NORCO/VICODIN) 5-325 MG tablet Take 1 tablet by mouth every 6 (six) hours as needed for moderate pain.  . hydrocortisone (ANUSOL-HC) 2.5 % rectal cream Place 1 application rectally 2 (two) times daily.  . hydrocortisone (ANUSOL-HC) 25 MG suppository Place 1 suppository (25 mg total) rectally every 12 (twelve) hours.  . Lancets (FREESTYLE) lancets CHECK BLOOD SUGAR TWICE A DAY  . lidocaine (LIDODERM) 5 % Place 1 patch onto the skin daily. Remove & Discard patch within 12 hours or as directed by MD  . meclizine (ANTIVERT) 25 MG tablet Take 1 tablet (25 mg total) by mouth 2 (two) times daily as needed for dizziness.  . methenamine (HIPREX) 1 g tablet Take 1 tablet by mouth 2 (two) times daily.  . Multiple Vitamins-Minerals (PRESERVISION AREDS 2) CAPS Take 1 capsule by mouth 2 (two) times daily.  . nitroGLYCERIN (NITROSTAT) 0.4 MG SL tablet Place 0.4 mg under the tongue every 5 (five) minutes as needed for chest pain (x 3 doses). Reported on 04/14/2016  . omeprazole (PRILOSEC) 20 MG capsule TAKE ONE CAPSULE BY MOUTH TWICE DAILY  . ondansetron (ZOFRAN-ODT) 4 MG disintegrating tablet TAKE 1 TABLET  BY MOUTH EVERY 8 HOURS AS NEEDED FOR NAUSEA OR VOMITING (Patient taking differently: TAKE 4 mg TABLET BY MOUTH EVERY 8 HOURS AS NEEDED FOR NAUSEA OR VOMITING)  . OVER THE COUNTER MEDICATION Take 1 capsule by mouth 2 (two) times daily. Integrative Digestive Formula  . OVER THE COUNTER MEDICATION 1 tablet 3 (three) times daily. Berberorine Gluco Defense   . Polyvinyl Alcohol-Povidone (REFRESH OP) Place 1 drop into both eyes 3 (three) times daily as needed (dry eyes).   .  pramipexole (MIRAPEX) 0.125 MG tablet Take 1-2 tablets (0.125-0.25 mg total) by mouth at bedtime as needed.  . rivaroxaban (XARELTO) 2.5 MG TABS tablet Take 2.5 mg by mouth 2 (two) times daily.  . [DISCONTINUED] ciprofloxacin (CIPRO) 500 MG tablet Take 500 mg by mouth 2 (two) times daily.   No facility-administered encounter medications on file as of 08/13/2018.     Allergies as of 08/13/2018 - Review Complete 05/20/2018  Allergen Reaction Noted  . Cymbalta [duloxetine hcl] Swelling 09/25/2012  . Gabapentin Swelling 09/25/2012  . Silicone Hives, Itching, Dermatitis, and Rash   . Metformin and related Other (See Comments) 12/10/2015  . Adhesive [tape] Rash 07/17/2011  . Cefazolin Hives 03/06/2012  . Ciprofloxacin Other (See Comments) 01/31/2013  . Clorazepate dipotassium Other (See Comments)   . Dilaudid [hydromorphone hcl] Other (See Comments) 03/11/2010  . Doxycycline Rash 11/03/2008  . Enablex [darifenacin hydrobromide er] Other (See Comments) 03/06/2012  . Levofloxacin Other (See Comments) 01/08/2014  . Lyrica [pregabalin] Swelling 05/08/2013  . Methadone hcl Other (See Comments)   . Morphine and related Other (See Comments) 10/31/2013  . Talwin [pentazocine] Other (See Comments) 12/04/2012    Past Medical History:  Diagnosis Date  . Abnormal echocardiogram    09/2013: mild LVH, mild focal basal hypertrophy of septum, EF 60-65%, normal WM, grade 1 diastolic dysfunction, MAC, mild LAE, ASA, PASP 34. Possible oscillating MV density - reviewed by MD and felt there was no significant abnormality other than MAC noted with mitral valve and did not require TEE.  . Adenomatous colon polyp   . Allergic rhinitis   . Arthritis    "back; ankles; hands; knees" (10/31/2013)  . Bilateral sensorineural hearing loss   . Bladder infection    frequently  . CAD (coronary artery disease)    a. Nonobst in 2011. b. Abnormal nuc 09/2013 -> s/p cutting balloon to D2, mild LAD disease; c. 08/2015 MV:  no ischemia, EF 71%.  . Cancer ([facility])    cancerous polyps  . Carcinoma in situ in a polyp 1994   a. 1994 - malignant polyp removed during colonoscopy.  . Charcot's Foot    a.  01/2016 s/p RLE transtibial amputation 2/2 Charcot rocker-bottom deformity and insensate neuropathy ulceration.  . Chronic diastolic CHF (congestive heart failure) ([facility])    a. 09/2013 Echo: EF 60-65%, mild LVH, Gr1 DD.  Marland Kitchen Diverticulosis   . DJD (degenerative joint disease)   . DVT (deep venous thrombosis) ([facility]) 12/2007  . Dyspnea    a. Chronic, extensive w/u see OV note 09-2010. b. [place] 2011: ormal with RA 5 RV 31/2 PA 27/12 (19) PCW 10 CO normal. No evidence of shunting with sitting up in cath lab. c. CPX 2011: see report. d. Prior fluoro of diaphragm -  R diaphragm elevated at rest but both moved with inspiration.   . Fatty liver   . GERD (gastroesophageal reflux disease)    a. Hx GERD/esophageal dysmotility followed by Dr. Olevia Perches.   Marland Kitchen Head injury  due to trauma   . Headache    Optic migraine  . Heart attack ([facility])    2017  . Hemorrhoids   . Hiatal hernia    a. s/p Nissen fundoplication 1610.  Marland Kitchen Hyperlipidemia    a. patient unwilling to use statins.  . Hypertensive heart disease   . IBS (irritable bowel syndrome)   . Macular degeneration 03/2009   Dr. Rosana Hoes  . Macular degeneration   . Myocardial infarction ([facility])   . Neuropathy    a. Hands, feet, legs.  . Orthostatic hypotension   . Osteomyelitis ([facility])    a. 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 01/2016 s/p RLE transtibial amputation 2/2 Charcot rocker-bottom deformity and insensate neuropathy ulceration.  . Osteoporosis   . PE (pulmonary embolism) 12/2007   a. PE/DVT after neck surgery 2009. b. coumadin d/c 10-2008.  Marland Kitchen Pneumonia 2015ish  . PONV (postoperative nausea and vomiting) 2009   neck surgery  . PPD positive   . Pulmonary nodule    incidental per CT:  Pet scan 4-9: likely benign, CT  08-2009 no change, no further CTs (Dr. Gwenette Greet)  . Recurrent UTI   . RSD (reflex sympathetic dystrophy)    a. Chronic pain.  Marland Kitchen Spinal stenosis   . Type II diabetes mellitus ([facility])    no on medication  . Venous insufficiency    a. Contributing to LEE.    Past Surgical History:  Procedure Laterality Date  . AMPUTATION  03/06/2012   Procedure: AMPUTATION FOOT;  Surgeon: Newt Minion, MD;  Location: [facility];  Service: Orthopedics;  Laterality: Left;  FIFTH RAY AMPUTATION   . AMPUTATION Right 02/18/2016   Procedure: AMPUTATION BELOW KNEE;  Surgeon: Newt Minion, MD;  Location: [facility];  Service: Orthopedics;  Laterality: Right;  . AMPUTATION Left 11/22/2016   Procedure: Amputation 4th Toe Left Foot at Metatarsophalangeal Joint;  Surgeon: Newt Minion, MD;  Location: [facility];  Service: Orthopedics;  Laterality: Left;  . ANKLE FUSION  09/27/2012   Procedure: ANKLE FUSION;  Surgeon: Newt Minion, MD;  Location: [facility];  Service: Orthopedics;  Laterality: Left;  Left Tibiocalcaneal Fusion  . ANKLE FUSION Right 05/09/2013   Procedure: ANKLE FUSION;  Surgeon: Newt Minion, MD;  Location: [facility];  Service: Orthopedics;  Laterality: Right;  Excision Osteomyelitis Base 1st MT Right Foot, Fusion Medial Column  . ANKLE SURGERY Left apr & june 1991  . ANTERIOR CERVICAL DECOMP/DISCECTOMY FUSION  12/18/07   For OA,  Dr. Lorin Mercy:  fu by a PE  . CARDIAC CATHETERIZATION  05/2010    at [facility]  . CARPAL TUNNEL RELEASE Left 09/2011  . CATARACT EXTRACTION W/ INTRAOCULAR LENS  IMPLANT, BILATERAL  2004   feb 2004 left, aug 2004 right  . CHOLECYSTECTOMY  03/2002  . COLONOSCOPY     numerous times  . CORONARY ANGIOPLASTY  10/31/2013  . CORONARY ARTERY BYPASS GRAFT  09/27/2016  . CORONARY ARTERY BYPASS GRAFT  09/2016   in [place].  . CYST REMOVAL HAND  06/2003  . FOOT BONE EXCISION Right 06/2009  . LAPAROSCOPIC RIGHT HEMI COLECTOMY N/A 01/08/2015   Procedure: LAPAROSCOPIC ASSISTED RIGHT HEMI COLECTOMY;  Surgeon: Johnathan Hausen, MD;  Location: [facility];  Service: General;  Laterality: N/A;  . LEFT HEART CATHETERIZATION WITH CORONARY ANGIOGRAM N/A 10/31/2013   Procedure: LEFT HEART CATHETERIZATION WITH CORONARY ANGIOGRAM;  Surgeon: Jettie Booze, MD;  Location: [facility] CATH  LAB;  Service: Cardiovascular;  Laterality: N/A;  . NASAL SEPTUM SURGERY  09/1963  . NISSEN FUNDOPLICATION  01/29/6758  . PERCUTANEOUS CORONARY INTERVENTION-BALLOON ONLY  10/31/2013   Procedure: PERCUTANEOUS CORONARY INTERVENTION-BALLOON ONLY;  Surgeon: Jettie Booze, MD;  Location: [facility] CATH LAB;  Service: Cardiovascular;;  . ROTATOR CUFF REPAIR Right 07/2007   Dr. Percell Miller  . ROTATOR CUFF REPAIR Left 11/2004  . stent in left leg, behind knee  06/27/2017   x2, done at [facility] in [facility]  . TOE AMPUTATION Right 08/2008   3rd toe, Dr. Sharol Given due to osteomyelitis  . VAGINAL HYSTERECTOMY  03/1975  . VEIN LIGATION Bilateral 03/1966  . VENA CAVA FILTER PLACEMENT  01/2010   green filter; "due to blood clots"  . WISDOM TOOTH EXTRACTION  06/2007    Family History  Problem Relation Age of Onset  . Heart disease Mother        mitral valve replaced  . Arthritis Mother   . Diabetic kidney disease Daughter   . Diabetes Father   . Stroke Father   . Migraines Sister   . Hypertension Unknown   . Breast cancer Unknown   . Headache Sister   . Colon cancer Neg Hx   . Esophageal cancer Neg Hx   . Stomach cancer Neg Hx   . Rectal cancer Neg Hx     Social History   Socioeconomic History  . Marital status: Divorced    Spouse name: Not on file  . Number of children: 3  . Years of education: 58  . Highest education level: Not on file  Occupational History  . Occupation: Retired     Comment: from Orth[employer]   Social Needs  . Financial resource strain: Not on file  . Food insecurity:    Worry: Not on file    Inability: Not on file  . Transportation needs:    Medical: Not on file    Non-medical: Not on file  Tobacco Use  .  Smoking status: Never Smoker  . Smokeless tobacco: Never Used  . Tobacco comment: tried for a few months in college.   Substance and Sexual Activity  . Alcohol use: No    Alcohol/week: 0.0 standard drinks  . Drug use: No  . Sexual activity: Not Currently    Birth control/protection: Post-menopausal  Lifestyle  . Physical activity:    Days per week: Not on file    Minutes per session: Not on file  . Stress: Not on file  Relationships  . Social connections:    Talks on phone: Not on file    Gets together: Not on file    Attends religious service: Not on file    Active member of club or organization: Not on file    Attends meetings of clubs or organizations: Not on file    Relationship status: Not on file  . Intimate partner violence:    Fear of current or ex partner: Not on file    Emotionally abused: Not on file    Physically abused: Not on file    Forced sexual activity: Not on file  Other Topics Concern  . Not on file  Social History Narrative   Daughter lives w/ her and another daughter lives in town   1 son in [place]   Son comes home every 4 weeks to visit   Caffeine use:  Tea 1/day w/ dinner   Coffee occass  Review of systems: Review of Systems  Constitutional: Negative for fever and chills.  HENT: Positive for difficulty with hearing and runny nose Eyes: Negative for blurred vision.  Respiratory: Negative for cough, shortness of breath and wheezing.   Cardiovascular: Negative for chest pain and palpitations.  Gastrointestinal: as per HPI Genitourinary: Positive for dysuria, urgency, frequency and negative for hematuria.  Musculoskeletal: Positive for myalgias, back pain and joint pain.  Skin: Negative for itching and rash.  Neurological: Negative for dizziness, tremors, focal weakness, seizures and loss of consciousness.  Positive for difficulty with balance and walking Endo/Heme/Allergies: Positive for seasonal allergies.  Psychiatric/Behavioral:  Negative for depression, suicidal ideas and hallucinations.  All other systems reviewed and are negative.   Physical Exam: Vitals:   08/13/18 1047  BP: (!) 142/86  Pulse: 84   There is no height or weight on file to calculate BMI. Gen:      No acute distress, wheelchair-bound, transfers to chair with difficulty HEENT:  EOMI, sclera anicteric Neck:     No masses; no thyromegaly Lungs:    Clear to auscultation bilaterally; normal respiratory effort CV:         Regular rate and rhythm; no murmurs Abd:      + bowel sounds; soft, non-tender; no palpable masses, no distension Ext:    Left lower leg prosthesis  skin:      Edema  neuro: alert and oriented x 3 Psych: normal mood and affect Rectal exam: Decreased anal sphincter tone, no anal fissure Anoscopy: Large protruding grade 3 internal hemorrhoids in right posterior and left lateral, no active bleeding, normal dentate line, no visible nodules  Data Reviewed:  Reviewed labs, radiology imaging, old records and pertinent past GI work up   Assessment and Plan/Recommendations:  83 year old female with multiple comorbidities including CAD status post CABG, peripheral vascular disease status post left lower leg amputation on Plavix and Xarelto with intermittent bright red blood per rectum most likely secondary to small volume hemorrhage from internal hemorrhoids  Will manage conservatively with Anusol suppository per rectum at bedtime as needed for 7 days Benefiber 1 to 2 teaspoon 3 times daily with meals Advised patient to avoid excessive straining or sitting on toilet for prolonged period of time  Start MiraLAX 1 capful daily and titrate up as needed to improve constipation Increase water intake to 60 to 80 ounces daily  GERD: Omeprazole 20 mg daily, 30 minutes before breakfast Small frequent meals Discussed antireflux measures and lifestyle modifications  Colorectal cancer screening: Small subcentimeter tubular adenomas removed  in March 2017.  No recall colonoscopy due to advanced age and multiple comorbidities  40 minutes was spent face-to-face with the patient. Greater than 50% of the time used for counseling as well as treatment plan and follow-up. She had multiple questions which were answered to her satisfaction  K. Denzil Magnuson , MD [phone]    CC: Colon Branch, MD

## 2018-08-13 NOTE — Patient Instructions (Signed)
HEMORRHOID  FOLLOW-UP CARE   1. Avoid strenuous exercise.  A sitz bath (soaking in a warm tub) or bidet is soothing, and can be useful for cleansing the area after bowel movements.     2. To avoid constipation, take two teaspoons three times daily of natural wheat bran, natural oat bran, flax, Benefiber or any over the counter fiber supplement and increase your water intake to 8-10 glasses daily.  Do Miralax 1 capful daily and titrate as needed to have daily soft bowel movements   3. Do not stay seated continuously for more than 2-3 hours for a day or two after the procedure.  Tighten your buttock muscles 10-15 times every two hours and take 10-15 deep breaths every 1-2 hours.  Do not spend more than a few minutes on the toilet if you cannot empty your bowel; instead re-visit the toilet at a later time  4          Use suppositories per rectum daily for 1 week as needed   Take prilosec daily   Gastroesophageal Reflux Disease, Adult Normally, food travels down the esophagus and stays in the stomach to be digested. However, when a person has gastroesophageal reflux disease (GERD), food and stomach acid move back up into the esophagus. When this happens, the esophagus becomes sore and inflamed. Over time, GERD can create small holes (ulcers) in the lining of the esophagus. What are the causes? This condition is caused by a problem with the muscle between the esophagus and the stomach (lower esophageal sphincter, or LES). Normally, the LES muscle closes after food passes through the esophagus to the stomach. When the LES is weakened or abnormal, it does not close properly, and that allows food and stomach acid to go back up into the esophagus. The LES can be weakened by certain dietary substances, medicines, and medical conditions, including:  Tobacco use.  Pregnancy.  Having a hiatal hernia.  Heavy alcohol use.  Certain foods and beverages, such as coffee, chocolate, onions, and  peppermint.  What increases the risk? This condition is more likely to develop in:  People who have an increased body weight.  People who have connective tissue disorders.  People who use NSAID medicines.  What are the signs or symptoms? Symptoms of this condition include:  Heartburn.  Difficult or painful swallowing.  The feeling of having a lump in the throat.  Abitter taste in the mouth.  Bad breath.  Having a large amount of saliva.  Having an upset or bloated stomach.  Belching.  Chest pain.  Shortness of breath or wheezing.  Ongoing (chronic) cough or a night-time cough.  Wearing away of tooth enamel.  Weight loss.  Different conditions can cause chest pain. Make sure to see your health care provider if you experience chest pain. How is this diagnosed? Your health care provider will take a medical history and perform a physical exam. To determine if you have mild or severe GERD, your health care provider may also monitor how you respond to treatment. You may also have other tests, including:  An endoscopy toexamine your stomach and esophagus with a small camera.  A test thatmeasures the acidity level in your esophagus.  A test thatmeasures how much pressure is on your esophagus.  A barium swallow or modified barium swallow to show the shape, size, and functioning of your esophagus.  How is this treated? The goal of treatment is to help relieve your symptoms and to prevent complications. Treatment for  this condition may vary depending on how severe your symptoms are. Your health care provider may recommend:  Changes to your diet.  Medicine.  Surgery.  Follow these instructions at home: Diet  Follow a diet as recommended by your health care provider. This may involve avoiding foods and drinks such as: ? Coffee and tea (with or without caffeine). ? Drinks that containalcohol. ? Energy drinks and sports drinks. ? Carbonated drinks or  sodas. ? Chocolate and cocoa. ? Peppermint and mint flavorings. ? Garlic and onions. ? Horseradish. ? Spicy and acidic foods, including peppers, chili powder, curry powder, vinegar, hot sauces, and barbecue sauce. ? Citrus fruit juices and citrus fruits, such as oranges, lemons, and limes. ? Tomato-based foods, such as red sauce, chili, salsa, and pizza with red sauce. ? Fried and fatty foods, such as donuts, french fries, potato chips, and high-fat dressings. ? High-fat meats, such as hot dogs and fatty cuts of red and white meats, such as rib eye steak, sausage, ham, and bacon. ? High-fat dairy items, such as whole milk, butter, and cream cheese.  Eat small, frequent meals instead of large meals.  Avoid drinking large amounts of liquid with your meals.  Avoid eating meals during the 2-3 hours before bedtime.  Avoid lying down right after you eat.  Do not exercise right after you eat. General instructions  Pay attention to any changes in your symptoms.  Take over-the-counter and prescription medicines only as told by your health care provider. Do not take aspirin, ibuprofen, or other NSAIDs unless your health care provider told you to do so.  Do not use any tobacco products, including cigarettes, chewing tobacco, and e-cigarettes. If you need help quitting, ask your health care provider.  Wear loose-fitting clothing. Do not wear anything tight around your waist that causes pressure on your abdomen.  Raise (elevate) the head of your bed 6 inches (15cm).  Try to reduce your stress, such as with yoga or meditation. If you need help reducing stress, ask your health care provider.  If you are overweight, reduce your weight to an amount that is healthy for you. Ask your health care provider for guidance about a safe weight loss goal.  Keep all follow-up visits as told by your health care provider. This is important. Contact a health care provider if:  You have new symptoms.  You  have unexplained weight loss.  You have difficulty swallowing, or it hurts to swallow.  You have wheezing or a persistent cough.  Your symptoms do not improve with treatment.  You have a hoarse voice. Get help right away if:  You have pain in your arms, neck, jaw, teeth, or back.  You feel sweaty, dizzy, or light-headed.  You have chest pain or shortness of breath.  You vomit and your vomit looks like blood or coffee grounds.  You faint.  Your stool is bloody or black.  You cannot swallow, drink, or eat. This information is not intended to replace advice given to you by your health care provider. Make sure you discuss any questions you have with your health care provider. Document Released: 07/19/2005 Document Revised: 03/08/2016 Document Reviewed: 02/03/2015 Elsevier Interactive Patient Education  [employer].

## 2018-08-14 ENCOUNTER — Telehealth: Payer: Self-pay | Admitting: Gastroenterology

## 2018-08-14 MED ORDER — OMEPRAZOLE 20 MG PO CPDR: 20.0000 mg | DELAYED_RELEASE_CAPSULE | Freq: Two times a day (BID) | ORAL | 3 refills | Status: DC

## 2018-08-14 MED ORDER — HYDROCORTISONE ACETATE 25 MG RE SUPP: 25.0000 mg | Freq: Two times a day (BID) | RECTAL | 1 refills | Status: DC

## 2018-08-14 NOTE — Telephone Encounter (Signed)
Medication sent to pharmacy  

## 2018-08-19 ENCOUNTER — Telehealth: Payer: Self-pay | Admitting: Gastroenterology

## 2018-08-19 MED ORDER — HYDROCORTISONE ACETATE 25 MG RE SUPP: 25.0000 mg | Freq: Two times a day (BID) | RECTAL | 1 refills | Status: DC

## 2018-08-19 MED ORDER — HYDROCORTISONE 2.5 % RE CREA: 1.0000 "application " | TOPICAL_CREAM | Freq: Two times a day (BID) | RECTAL | 0 refills | Status: DC

## 2018-08-19 NOTE — Telephone Encounter (Signed)
Sent refills to requested pharmacy

## 2018-08-20 MED ORDER — HYDROCORTISONE ACETATE 25 MG RE SUPP: 25.0000 mg | Freq: Two times a day (BID) | RECTAL | 1 refills | Status: DC

## 2018-08-20 NOTE — Telephone Encounter (Signed)
°  Pt daughter called asking if we can please re sent de prescription for hydrocortisone (ANUSOL-HC) 25 MG suppository to [facility], [address] AT [place] states they don/t have the prescription.

## 2018-08-20 NOTE — Addendum Note (Signed)
Addended by: Marzella Schlein on: 08/20/2018 03:59 PM   Modules accepted: Orders

## 2018-08-20 NOTE — Telephone Encounter (Signed)
Prescription resent to patient's pharmacy. 

## 2018-08-21 ENCOUNTER — Ambulatory Visit (INDEPENDENT_AMBULATORY_CARE_PROVIDER_SITE_OTHER): Payer: Medicare Other | Admitting: Internal Medicine

## 2018-08-21 ENCOUNTER — Encounter: Payer: Self-pay | Admitting: Internal Medicine

## 2018-08-21 VITALS — BP 124/80 | HR 49 | Temp 98.1°F | Resp 16 | Ht 64.0 in

## 2018-08-21 DIAGNOSIS — G9050 Complex regional pain syndrome I, unspecified: Secondary | ICD-10-CM | POA: Diagnosis not present

## 2018-08-21 DIAGNOSIS — D649 Anemia, unspecified: Secondary | ICD-10-CM

## 2018-08-21 DIAGNOSIS — E1142 Type 2 diabetes mellitus with diabetic polyneuropathy: Secondary | ICD-10-CM | POA: Diagnosis not present

## 2018-08-21 DIAGNOSIS — I10 Essential (primary) hypertension: Secondary | ICD-10-CM

## 2018-08-21 DIAGNOSIS — I2510 Atherosclerotic heart disease of native coronary artery without angina pectoris: Secondary | ICD-10-CM | POA: Diagnosis not present

## 2018-08-21 DIAGNOSIS — Z23 Encounter for immunization: Secondary | ICD-10-CM | POA: Diagnosis not present

## 2018-08-21 DIAGNOSIS — G629 Polyneuropathy, unspecified: Secondary | ICD-10-CM

## 2018-08-21 LAB — CBC WITH DIFFERENTIAL/PLATELET
Basophils Absolute: 0 10*3/uL (ref 0.0–0.1)
Basophils Relative: 0.4 % (ref 0.0–3.0)
Eosinophils Absolute: 0.2 10*3/uL (ref 0.0–0.7)
Eosinophils Relative: 4.6 % (ref 0.0–5.0)
HCT: 38.1 % (ref 36.0–46.0)
Hemoglobin: 12.5 g/dL (ref 12.0–15.0)
Lymphocytes Relative: 29.1 % (ref 12.0–46.0)
Lymphs Abs: 1.2 10*3/uL (ref 0.7–4.0)
MCHC: 32.8 g/dL (ref 30.0–36.0)
MCV: 86.5 fl (ref 78.0–100.0)
Monocytes Absolute: 0.3 10*3/uL (ref 0.1–1.0)
Monocytes Relative: 7.2 % (ref 3.0–12.0)
Neutro Abs: 2.5 10*3/uL (ref 1.4–7.7)
Neutrophils Relative %: 58.7 % (ref 43.0–77.0)
Platelets: 186 10*3/uL (ref 150.0–400.0)
RBC: 4.41 Mil/uL (ref 3.87–5.11)
RDW: 16.2 % — ABNORMAL HIGH (ref 11.5–15.5)
WBC: 4.2 10*3/uL (ref 4.0–10.5)

## 2018-08-21 LAB — BASIC METABOLIC PANEL
BUN: 19 mg/dL (ref 6–23)
CO2: 22 mEq/L (ref 19–32)
Calcium: 8.4 mg/dL (ref 8.4–10.5)
Chloride: 107 mEq/L (ref 96–112)
Creatinine, Ser: 1.11 mg/dL (ref 0.40–1.20)
GFR: 50.12 mL/min — ABNORMAL LOW (ref >60.00)
Glucose, Bld: 137 mg/dL — ABNORMAL HIGH (ref 70–99)
Potassium: 4.6 mEq/L (ref 3.5–5.1)
Sodium: 139 mEq/L (ref 135–145)

## 2018-08-21 LAB — IRON: Iron: 61 ug/dL (ref 42–145)

## 2018-08-21 LAB — FERRITIN: Ferritin: 27.7 ng/mL (ref 10.0–291.0)

## 2018-08-21 MED ORDER — HYDROCODONE-ACETAMINOPHEN 5-325 MG PO TABS: 1.0000 | ORAL_TABLET | Freq: Four times a day (QID) | ORAL | 0 refills | Status: DC | PRN

## 2018-08-21 MED ORDER — DICLOFENAC SODIUM 1 % TD GEL: TRANSDERMAL | 5 refills | Status: DC

## 2018-08-21 NOTE — Patient Instructions (Signed)
GO TO THE LAB : Get the blood work     GO TO THE FRONT DESK Schedule your next appointment for a  Check up in 4 months   

## 2018-08-21 NOTE — Progress Notes (Signed)
Pre visit review using our clinic review tool, if applicable. No additional management support is needed unless otherwise documented below in the visit note. 

## 2018-08-21 NOTE — Assessment & Plan Note (Signed)
--  Td 2014 ; pnm 23 2011; pnm 12 2015; s/p zostavax; shingrex discussed, flu shot today -- Palpable aorta, u/s 10-2011 neg for AAA  --HX of Malignant Colon Polyp in 1994, colonoscopy   8-10,  05-2014 and 2017;  next per GI  -- h/o hysterectomy for nonmalignant reasons, no recent Pap. -- MMG: 06-2018

## 2018-08-21 NOTE — Progress Notes (Signed)
Subjective:    Patient ID: [name], female    DOB: 1934-12-08, 83 y.o.   MRN: [id number]  DOS:  08/21/2018 Type of visit - description : f/u , here with her grandson Interval history: Since the last office visit she was admitted twice to an outside hospital due to a UTI and subsequently chest pain. Since then, has seen urology and cardiology. Overall feels well, good compliance with medications.   Review of Systems Denies further chest pain Good compliance with iron tablets, reports that since she started iron, stools are dark.   Past Medical History:  Diagnosis Date  . Abnormal echocardiogram    09/2013: mild LVH, mild focal basal hypertrophy of septum, EF 60-65%, normal WM, grade 1 diastolic dysfunction, MAC, mild LAE, ASA, PASP 34. Possible oscillating MV density - reviewed by MD and felt there was no significant abnormality other than MAC noted with mitral valve and did not require TEE.  . Adenomatous colon polyp   . Allergic rhinitis   . Arthritis    "back; ankles; hands; knees" (10/31/2013)  . Bilateral sensorineural hearing loss   . CAD (coronary artery disease)    a. Nonobst in 2011. b. Abnormal nuc 09/2013 -> s/p cutting balloon to D2, mild LAD disease; c. 08/2015 MV: no ischemia, EF 71%.  . Cancer ([place])    cancerous polyps  . Carcinoma in situ in a polyp 1994   a. 1994 - malignant polyp removed during colonoscopy.  . Charcot's Foot    a.  01/2016 s/p RLE transtibial amputation 2/2 Charcot rocker-bottom deformity and insensate neuropathy ulceration.  . Chronic diastolic CHF (congestive heart failure) ([place])    a. 09/2013 Echo: EF 60-65%, mild LVH, Gr1 DD.  Marland Kitchen Diverticulosis   . DJD (degenerative joint disease)   . DVT (deep venous thrombosis) ([place]) 12/2007  . Dyspnea    a. Chronic, extensive w/u see OV note 09-2010. b. [place] 2011: ormal with RA 5 RV 31/2 PA 27/12 (19) PCW 10 CO normal. No evidence of shunting with sitting up in cath lab. c. CPX 2011: see report. d.  Prior fluoro of diaphragm -  R diaphragm elevated at rest but both moved with inspiration.   . Fatty liver   . GERD (gastroesophageal reflux disease)    a. Hx GERD/esophageal dysmotility followed by Dr. Olevia Perches.   Marland Kitchen Head injury due to trauma   . Headache    Optic migraine  . Hemorrhoids   . Hiatal hernia    a. s/p Nissen fundoplication 4259.  Marland Kitchen Hyperlipidemia    a. patient unwilling to use statins.  . Hypertensive heart disease   . IBS (irritable bowel syndrome)   . Macular degeneration 03/2009   Dr. Rosana Hoes  . Myocardial infarction ([place]) 2017  . Neuropathy    a. Hands, feet, legs.  . Orthostatic hypotension   . Osteomyelitis ([place])    a. 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 01/2016 s/p RLE transtibial amputation 2/2 Charcot rocker-bottom deformity and insensate neuropathy ulceration.  . Osteoporosis   . PE (pulmonary embolism) 12/2007   a. PE/DVT after neck surgery 2009. b. coumadin d/c 10-2008.  Marland Kitchen Pneumonia 2015ish  . PONV (postoperative nausea and vomiting) 2009   neck surgery  . PPD positive   . Pulmonary nodule    incidental per CT:  Pet scan 4-9: likely benign, CT 08-2009 no change, no further CTs (Dr. Gwenette Greet)  . Recurrent UTI   .  RSD (reflex sympathetic dystrophy)    a. Chronic pain.  Marland Kitchen Spinal stenosis   . Type II diabetes mellitus (H[facility])    no on medication  . Venous insufficiency    a. Contributing to LEE.    Past Surgical History:  Procedure Laterality Date  . AMPUTATION  03/06/2012   Procedure: AMPUTATION FOOT;  Surgeon: Newt Minion, MD;  Location: [facility];  Service: Orthopedics;  Laterality: Left;  FIFTH RAY AMPUTATION   . AMPUTATION Right 02/18/2016   Procedure: AMPUTATION BELOW KNEE;  Surgeon: Newt Minion, MD;  Location: [facility];  Service: Orthopedics;  Laterality: Right;  . AMPUTATION Left 11/22/2016   Procedure: Amputation 4th Toe Left Foot at Metatarsophalangeal Joint;  Surgeon: Newt Minion, MD;   Location: [facility];  Service: Orthopedics;  Laterality: Left;  . ANKLE FUSION  09/27/2012   Procedure: ANKLE FUSION;  Surgeon: Newt Minion, MD;  Location: [facility];  Service: Orthopedics;  Laterality: Left;  Left Tibiocalcaneal Fusion  . ANKLE FUSION Right 05/09/2013   Procedure: ANKLE FUSION;  Surgeon: Newt Minion, MD;  Location: [facility];  Service: Orthopedics;  Laterality: Right;  Excision Osteomyelitis Base 1st MT Right Foot, Fusion Medial Column  . ANTERIOR CERVICAL DECOMP/DISCECTOMY FUSION  12/18/07   For OA,  Dr. Lorin Mercy:  fu by a PE  . CARDIAC CATHETERIZATION  05/2010    at [facility]  . CARPAL TUNNEL RELEASE Left 09/2011  . CATARACT EXTRACTION W/ INTRAOCULAR LENS  IMPLANT, BILATERAL  2004   feb 2004 left, aug 2004 right  . CHOLECYSTECTOMY  03/2002  . CORONARY ANGIOPLASTY  10/31/2013  . CORONARY ARTERY BYPASS GRAFT  09/27/2016  . CYST REMOVAL HAND  06/2003  . FOOT BONE EXCISION Right 06/2009  . LAPAROSCOPIC RIGHT HEMI COLECTOMY N/A 01/08/2015   Procedure: LAPAROSCOPIC ASSISTED RIGHT HEMI COLECTOMY;  Surgeon: Johnathan Hausen, MD;  Location: [facility];  Service: General;  Laterality: N/A;  . LEFT HEART CATHETERIZATION WITH CORONARY ANGIOGRAM N/A 10/31/2013   Procedure: LEFT HEART CATHETERIZATION WITH CORONARY ANGIOGRAM;  Surgeon: Jettie Booze, MD;  Location[facility];  Service: Cardiovascular;  Laterality: N/A;  . NASAL SEPTUM SURGERY  09/1963  . NISSEN FUNDOPLICATION  05/25/2548  . PERCUTANEOUS CORONARY INTERVENTION-BALLOON ONLY  10/31/2013   Procedure: PERCUTANEOUS CORONARY INTERVENTION-BALLOON ONLY;  Surgeon: Jettie Booze, MD;  Location: [facility] CATH LAB;  Service: Cardiovascular;;  . ROTATOR CUFF REPAIR Right 07/2007   Dr. Percell Miller  . ROTATOR CUFF REPAIR Left 11/2004  . stent in left leg, behind knee  06/27/2017   x2, done at [facility] cardiology in [facility]  . TOE AMPUTATION Right 08/2008   3rd toe, Dr. Sharol Given due to osteomyelitis  . VAGINAL HYSTERECTOMY  03/1975  . VEIN LIGATION Bilateral  03/1966  . VENA CAVA FILTER PLACEMENT  01/2010   green filter; "due to blood clots"  . WISDOM TOOTH EXTRACTION  06/2007    Social History   Socioeconomic History  . Marital status: Divorced    Spouse name: Not on file  . Number of children: 3  . Years of education: 62  . Highest education level: Not on file  Occupational History  . Occupation: Retired     Comment: from Orth[employer]   Social Needs  . Financial resource strain: Not on file  . Food insecurity:    Worry: Not on file    Inability: Not on file  . Transportation needs:    Medical: Not on file  Non-medical: Not on file  Tobacco Use  . Smoking status: Never Smoker  . Smokeless tobacco: Never Used  . Tobacco comment: tried for a few months in college.   Substance and Sexual Activity  . Alcohol use: No    Alcohol/week: 0.0 standard drinks  . Drug use: No  . Sexual activity: Not Currently    Birth control/protection: Post-menopausal  Lifestyle  . Physical activity:    Days per week: Not on file    Minutes per session: Not on file  . Stress: Not on file  Relationships  . Social connections:    Talks on phone: Not on file    Gets together: Not on file    Attends religious service: Not on file    Active member of club or organization: Not on file    Attends meetings of clubs or organizations: Not on file    Relationship status: Not on file  . Intimate partner violence:    Fear of current or ex partner: Not on file    Emotionally abused: Not on file    Physically abused: Not on file    Forced sexual activity: Not on file  Other Topics Concern  . Not on file  Social History Narrative   Daughter lives w/ her and another daughter lives in town   1 son in [place]   Son comes home every 4 weeks to visit   Caffeine use:  Tea 1/day w/ dinner   Coffee occass             Allergies as of 08/21/2018      Reactions   Cymbalta [duloxetine Hcl] Swelling   Swelling in legs   Gabapentin Swelling   Swelling in  legs Swelling in legs   Silicone Hives, Itching, Dermatitis, Rash   Metformin And Related Other (See Comments)   dizzy, tired, chills, diarrhea, and nausea   Adhesive [tape] Rash   rash rash   Cefazolin Hives   Hives Hives   Ciprofloxacin Other (See Comments)   Other reaction(s): Other (See Comments) Per pt, caused body aches Per pt, caused body aches   Clorazepate Dipotassium Other (See Comments)   Unknown reaction   Dilaudid [hydromorphone Hcl] Other (See Comments)    confused, intense itching   Doxycycline Rash   Enablex [darifenacin Hydrobromide Er] Other (See Comments)   Hypotension, near syncope   Levofloxacin Other (See Comments)   Causes wrist pain   Lyrica [pregabalin] Swelling   Swelling in legs   Methadone Hcl Other (See Comments)   Reaction unknown   Morphine And Related Other (See Comments)   Confusion, constipation.   Talwin [pentazocine] Other (See Comments)   "climbing walls" anxiety      Medication List        Accurate as of 08/21/18  1:39 PM. Always use your most recent med list.          Alpha-Lipoic Acid 600 MG Caps Take 600 mg by mouth 2 (two) times daily.   amitriptyline 10 MG tablet Commonly known as:  ELAVIL TAKE 2 TABLETS(20 MG) BY MOUTH AT BEDTIME   carvedilol 25 MG tablet Commonly known as:  COREG Take 25 mg by mouth 2 (two) times daily with a meal.   clopidogrel 75 MG tablet Commonly known as:  PLAVIX Take 75 mg by mouth at bedtime.   diclofenac sodium 1 % Gel Commonly known as:  VOLTAREN APPLY 2-4 GRAMS TO AFFECTED AREA 3 TIMES DAILY AS NEEDED   ferrous  sulfate 325 (65 FE) MG tablet Take 1 tablet (325 mg total) by mouth 2 (two) times daily before a meal.   freestyle lancets CHECK BLOOD SUGAR TWICE A DAY   FREESTYLE LITE test strip Generic drug:  glucose blood USE TO CHECK BLOOD SUGAR NO MORE THAN TWICE DAILY   furosemide 20 MG tablet Commonly known as:  LASIX Take 20 mg by mouth daily.   HYDROcodone-acetaminophen  5-325 MG tablet Commonly known as:  NORCO/VICODIN Take 1 tablet by mouth every 6 (six) hours as needed for moderate pain.   hydrocortisone 2.5 % rectal cream Commonly known as:  ANUSOL-HC Place 1 application rectally 2 (two) times daily.   hydrocortisone 25 MG suppository Commonly known as:  ANUSOL-HC Place 1 suppository (25 mg total) rectally every 12 (twelve) hours.   lidocaine 5 % Commonly known as:  LIDODERM Place 1 patch onto the skin daily. Remove & Discard patch within 12 hours or as directed by MD   meclizine 25 MG tablet Commonly known as:  ANTIVERT Take 1 tablet (25 mg total) by mouth 2 (two) times daily as needed for dizziness.   methenamine 1 g tablet Commonly known as:  HIPREX Take 1 tablet by mouth 2 (two) times daily.   nitroGLYCERIN 0.4 MG SL tablet Commonly known as:  NITROSTAT Place 0.4 mg under the tongue every 5 (five) minutes as needed for chest pain (x 3 doses). Reported on 04/14/2016   omeprazole 20 MG capsule Commonly known as:  PRILOSEC Take 1 capsule (20 mg total) by mouth 2 (two) times daily.   ondansetron 4 MG disintegrating tablet Commonly known as:  ZOFRAN-ODT TAKE 1 TABLET BY MOUTH EVERY 8 HOURS AS NEEDED FOR NAUSEA OR VOMITING   OVER THE COUNTER MEDICATION 1 tablet 3 (three) times daily. Berberorine Gluco Defense   OVER THE COUNTER MEDICATION Take 1 capsule by mouth 2 (two) times daily. Integrative Digestive Formula   pramipexole 0.125 MG tablet Commonly known as:  MIRAPEX Take 1-2 tablets (0.125-0.25 mg total) by mouth at bedtime as needed.   PRESERVISION AREDS 2 Caps Take 1 capsule by mouth 2 (two) times daily.   REFRESH OP Place 1 drop into both eyes 3 (three) times daily as needed (dry eyes).   Vitamin B-12 500 MCG Subl Place 500 mcg under the tongue daily.   Vitamin D3 5000 units Caps Take 5,000 Units by mouth every evening. Reported on 11/24/2015   XARELTO 2.5 MG Tabs tablet Generic drug:  rivaroxaban Take 2.5 mg by mouth  2 (two) times daily.          Objective:   Physical Exam BP 124/80 (BP Location: Left Arm, Patient Position: Sitting, Cuff Size: Normal)   Pulse (!) 49   Temp 98.1 F (36.7 C) (Oral)   Resp 16   Ht _0  (1.626 m)   SpO2 90%   BMI 33.72 kg/m   General:   Well developed, NAD, see BMI.  HEENT:  Normocephalic . Face symmetric, atraumatic Lungs:  CTA B Normal respiratory effort, no intercostal retractions, no accessory muscle use. Heart: RRR,  no murmur.  no pretibial edema bilaterally  Abdomen:  Exam performed all the patient is sitting in a wheelchair; abdomen is soft, nontender. Skin: Not pale. Not jaundice Neurologic:  alert & oriented X3.  Speech normal, gait not tested. Psych--  Cognition and judgment appear intact.  Cooperative with normal attention span and concentration.  Behavior appropriate. No anxious or depressed appearing.     Assessment &  Plan:   Assessment DM, Neuropathy, amputations due to Charcot feet. - metformin intolerant  (lethargic) HTN  Hyperlipidemia-- won't take statins  LUNG MASS -incidental per CT 2009,  PET scan 01-2008: likely benign, CT 08-2009 no change, no further CTs per Dr Gwenette Greet - Had a CT in [place] 09-2016 "spiculated mass", saw Dr Lamonte Sakai,  CT  09/2017 stable CV: ---CAD sees Dr Meda Coffee and a cardiologist in [facility] --NSTEMI 09-2016. Outside hospital: Cath 2 vessel disease, CABG 2, LIMA to LAD and Diag   ---Chronic diastolic CHF ---PVD: Aortogram and a stent L Leg Dr Feliberto Gottron, 06-27-2017 @ [place] (per pt) GI: GERD, IBS, colon polyps, PUD, abdominal pain "post polypectomy syndrome" naueas meds prn (antivert-zofran, gets nausea when car traveling) Osteoporosis  MSK,pain mngmt : --Reflex sympathetic dystrophy --Neuropathy --DJD --Spinal stenosis  --H/o Osteomyelitis  Foot --s/p amputation:  4th 5th L toes , R BKA  (01-2016, dx osteomyelitis) w/ phantom pain  -- sees Dr Sharol Given prn Takes nortriptyline also for leg cramps. She had  local injections with Dr. Maia Petties 2016 w/o apparent help. She took oxycodone after surgery and that did NOT seem to help better than hydrocodone. Intolerant to Cymbalta, Neurontin ; Lyrica helped but caused edema. Recurrent UTIs: Dr Junious Silk  Venous insufficiency  H/o  + PPD H/o hypothyroidism: TSHs stable H/o dyspnea  PLAN:  HTN: Controlled on carvedilol, Lasix, check a BMP CAD: Admitted to a outside hospital in [place], ACS was ruled out with enzymes, subsequently saw cardiology.  See scanned note.  Currently asymptomatic. Recurrent UTIs: Was admitted to a outside hospital in [place] due to the urine culture not susceptible to any oral antibiotics, was given IV antibiotics x4 days, currently taking methenamine BID.  Saw urology after the last admission. Anemia: Chronic anemia, on iron, would like to stop iron if possible.  Will check a CBC, iron ferritin, if hemoglobin at baseline and iron reserves satisfactory will stop iron. RSD, neuropathy, DJD: Refill hydrocodone, refill topical NSAIDs Preventive care reviewed RTC  4 months

## 2018-08-22 NOTE — Assessment & Plan Note (Addendum)
HTN: Controlled on carvedilol, Lasix, check a BMP CAD: Admitted to a outside hospital in [facility], ACS was ruled out with enzymes, subsequently saw cardiology.  See scanned note.  Currently asymptomatic. Recurrent UTIs: Was admitted to a outside hospital in [place] due to the urine culture not susceptible to any oral antibiotics, was given IV antibiotics x4 days, currently taking methenamine BID.  Saw urology after the last admission. Anemia: Chronic anemia, on iron, would like to stop iron if possible.  Will check a CBC, iron ferritin, if hemoglobin at baseline and iron reserves satisfactory will stop iron. RSD, neuropathy, DJD: Refill hydrocodone, refill topical NSAIDs Preventive care reviewed RTC  4 months

## 2018-08-23 ENCOUNTER — Telehealth: Payer: Self-pay | Admitting: Gastroenterology

## 2018-08-23 NOTE — Telephone Encounter (Signed)
Advised the plan is to take both. Titrate the Miralax to her needs.

## 2018-08-26 ENCOUNTER — Telehealth: Payer: Self-pay | Admitting: Neurology

## 2018-08-26 DIAGNOSIS — G894 Chronic pain syndrome: Secondary | ICD-10-CM | POA: Diagnosis not present

## 2018-08-26 DIAGNOSIS — M545 Low back pain: Secondary | ICD-10-CM | POA: Diagnosis not present

## 2018-08-26 DIAGNOSIS — M6281 Muscle weakness (generalized): Secondary | ICD-10-CM | POA: Diagnosis not present

## 2018-08-26 DIAGNOSIS — R110 Nausea: Secondary | ICD-10-CM

## 2018-08-26 DIAGNOSIS — M25512 Pain in left shoulder: Secondary | ICD-10-CM | POA: Diagnosis not present

## 2018-08-26 DIAGNOSIS — G8921 Chronic pain due to trauma: Secondary | ICD-10-CM | POA: Diagnosis not present

## 2018-08-26 DIAGNOSIS — R2689 Other abnormalities of gait and mobility: Secondary | ICD-10-CM | POA: Diagnosis not present

## 2018-08-26 DIAGNOSIS — M25562 Pain in left knee: Secondary | ICD-10-CM | POA: Diagnosis not present

## 2018-08-26 DIAGNOSIS — G8929 Other chronic pain: Secondary | ICD-10-CM | POA: Diagnosis not present

## 2018-08-26 NOTE — Telephone Encounter (Signed)
Pt requesting refills for ondansetron (ZOFRAN-ODT) 4 MG disintegrating tablet sent to [facility] in [facility], scheduled f/u for 12/18

## 2018-08-26 NOTE — Telephone Encounter (Signed)
Patient last seen for BPPV in 10/2016 and was given Zofran refill 06/2017. Will check with Dr. Jaynee Eagles before sending in refill. Patient's f/u appt scheduled for 10/09/18.

## 2018-08-27 MED ORDER — ONDANSETRON 4 MG PO TBDP: ORAL_TABLET | ORAL | 0 refills | Status: DC

## 2018-08-27 NOTE — Addendum Note (Signed)
Addended by: Gildardo Griffes on: 08/27/2018 04:56 PM   Modules accepted: Orders

## 2018-08-27 NOTE — Telephone Encounter (Signed)
Ondansetron refilled to [facility] [place] per pt request and verbal order from Dr. Jaynee Eagles.

## 2018-08-27 NOTE — Telephone Encounter (Signed)
zofran is prn so I am ok with it thanks

## 2018-08-27 NOTE — Addendum Note (Signed)
Addended by: Gildardo Griffes on: 08/27/2018 05:13 PM   Modules accepted: Orders

## 2018-08-29 DIAGNOSIS — M25512 Pain in left shoulder: Secondary | ICD-10-CM | POA: Diagnosis not present

## 2018-08-29 DIAGNOSIS — M545 Low back pain: Secondary | ICD-10-CM | POA: Diagnosis not present

## 2018-08-29 DIAGNOSIS — M25562 Pain in left knee: Secondary | ICD-10-CM | POA: Diagnosis not present

## 2018-08-29 DIAGNOSIS — G894 Chronic pain syndrome: Secondary | ICD-10-CM | POA: Diagnosis not present

## 2018-08-29 DIAGNOSIS — R2689 Other abnormalities of gait and mobility: Secondary | ICD-10-CM | POA: Diagnosis not present

## 2018-08-29 DIAGNOSIS — G8929 Other chronic pain: Secondary | ICD-10-CM | POA: Diagnosis not present

## 2018-08-29 DIAGNOSIS — M6281 Muscle weakness (generalized): Secondary | ICD-10-CM | POA: Diagnosis not present

## 2018-08-29 DIAGNOSIS — G8921 Chronic pain due to trauma: Secondary | ICD-10-CM | POA: Diagnosis not present

## 2018-09-03 DIAGNOSIS — G8921 Chronic pain due to trauma: Secondary | ICD-10-CM | POA: Diagnosis not present

## 2018-09-03 DIAGNOSIS — R2689 Other abnormalities of gait and mobility: Secondary | ICD-10-CM | POA: Diagnosis not present

## 2018-09-03 DIAGNOSIS — G8929 Other chronic pain: Secondary | ICD-10-CM | POA: Diagnosis not present

## 2018-09-03 DIAGNOSIS — M6281 Muscle weakness (generalized): Secondary | ICD-10-CM | POA: Diagnosis not present

## 2018-09-03 DIAGNOSIS — M545 Low back pain: Secondary | ICD-10-CM | POA: Diagnosis not present

## 2018-09-03 DIAGNOSIS — G894 Chronic pain syndrome: Secondary | ICD-10-CM | POA: Diagnosis not present

## 2018-09-03 DIAGNOSIS — M25562 Pain in left knee: Secondary | ICD-10-CM | POA: Diagnosis not present

## 2018-09-03 DIAGNOSIS — M25512 Pain in left shoulder: Secondary | ICD-10-CM | POA: Diagnosis not present

## 2018-09-05 DIAGNOSIS — M545 Low back pain: Secondary | ICD-10-CM | POA: Diagnosis not present

## 2018-09-05 DIAGNOSIS — M6281 Muscle weakness (generalized): Secondary | ICD-10-CM | POA: Diagnosis not present

## 2018-09-05 DIAGNOSIS — M25562 Pain in left knee: Secondary | ICD-10-CM | POA: Diagnosis not present

## 2018-09-05 DIAGNOSIS — R2689 Other abnormalities of gait and mobility: Secondary | ICD-10-CM | POA: Diagnosis not present

## 2018-09-05 DIAGNOSIS — G894 Chronic pain syndrome: Secondary | ICD-10-CM | POA: Diagnosis not present

## 2018-09-05 DIAGNOSIS — G8921 Chronic pain due to trauma: Secondary | ICD-10-CM | POA: Diagnosis not present

## 2018-09-05 DIAGNOSIS — M25512 Pain in left shoulder: Secondary | ICD-10-CM | POA: Diagnosis not present

## 2018-09-05 DIAGNOSIS — G8929 Other chronic pain: Secondary | ICD-10-CM | POA: Diagnosis not present

## 2018-09-06 ENCOUNTER — Ambulatory Visit: Payer: Medicare Other | Admitting: Internal Medicine

## 2018-09-10 DIAGNOSIS — G8921 Chronic pain due to trauma: Secondary | ICD-10-CM | POA: Diagnosis not present

## 2018-09-10 DIAGNOSIS — G894 Chronic pain syndrome: Secondary | ICD-10-CM | POA: Diagnosis not present

## 2018-09-10 DIAGNOSIS — M25512 Pain in left shoulder: Secondary | ICD-10-CM | POA: Diagnosis not present

## 2018-09-10 DIAGNOSIS — M545 Low back pain: Secondary | ICD-10-CM | POA: Diagnosis not present

## 2018-09-10 DIAGNOSIS — G8929 Other chronic pain: Secondary | ICD-10-CM | POA: Diagnosis not present

## 2018-09-10 DIAGNOSIS — R2689 Other abnormalities of gait and mobility: Secondary | ICD-10-CM | POA: Diagnosis not present

## 2018-09-10 DIAGNOSIS — M6281 Muscle weakness (generalized): Secondary | ICD-10-CM | POA: Diagnosis not present

## 2018-09-10 DIAGNOSIS — M25562 Pain in left knee: Secondary | ICD-10-CM | POA: Diagnosis not present

## 2018-09-12 DIAGNOSIS — G8921 Chronic pain due to trauma: Secondary | ICD-10-CM | POA: Diagnosis not present

## 2018-09-12 DIAGNOSIS — M25562 Pain in left knee: Secondary | ICD-10-CM | POA: Diagnosis not present

## 2018-09-12 DIAGNOSIS — R2689 Other abnormalities of gait and mobility: Secondary | ICD-10-CM | POA: Diagnosis not present

## 2018-09-12 DIAGNOSIS — M25512 Pain in left shoulder: Secondary | ICD-10-CM | POA: Diagnosis not present

## 2018-09-12 DIAGNOSIS — G8929 Other chronic pain: Secondary | ICD-10-CM | POA: Diagnosis not present

## 2018-09-12 DIAGNOSIS — M545 Low back pain: Secondary | ICD-10-CM | POA: Diagnosis not present

## 2018-09-12 DIAGNOSIS — M6281 Muscle weakness (generalized): Secondary | ICD-10-CM | POA: Diagnosis not present

## 2018-09-12 DIAGNOSIS — G894 Chronic pain syndrome: Secondary | ICD-10-CM | POA: Diagnosis not present

## 2018-09-16 ENCOUNTER — Telehealth: Payer: Self-pay | Admitting: Internal Medicine

## 2018-09-16 NOTE — Telephone Encounter (Signed)
Copied from [place] [id number]. Topic: Quick Communication - Rx Refill/Question >> Sep 16, 2018  4:31 PM Cecelia Byars, NT wrote: Medication  HYDROcodone-acetaminophen (NORCO/VICODIN) 5-325 MG tablet  Has the patient contacted their pharmacy? no: (Agent: If no, request that the patient contact the pharmacy for the refill. (Agent: If yes, when and what did the pharmacy advise?)  Preferred Pharmacy (with phone number or street name   [facility] STORE [place], [address] AT [address] [phone] (Phone) [phone]   Agent: Please be advised that RX refills may take up to 3 business days. We ask that you follow-up with your pharmacy.

## 2018-09-17 DIAGNOSIS — M6281 Muscle weakness (generalized): Secondary | ICD-10-CM | POA: Diagnosis not present

## 2018-09-17 DIAGNOSIS — R2689 Other abnormalities of gait and mobility: Secondary | ICD-10-CM | POA: Diagnosis not present

## 2018-09-17 DIAGNOSIS — G894 Chronic pain syndrome: Secondary | ICD-10-CM | POA: Diagnosis not present

## 2018-09-17 DIAGNOSIS — M25512 Pain in left shoulder: Secondary | ICD-10-CM | POA: Diagnosis not present

## 2018-09-17 DIAGNOSIS — G8921 Chronic pain due to trauma: Secondary | ICD-10-CM | POA: Diagnosis not present

## 2018-09-17 DIAGNOSIS — M545 Low back pain: Secondary | ICD-10-CM | POA: Diagnosis not present

## 2018-09-17 DIAGNOSIS — M25562 Pain in left knee: Secondary | ICD-10-CM | POA: Diagnosis not present

## 2018-09-17 DIAGNOSIS — G8929 Other chronic pain: Secondary | ICD-10-CM | POA: Diagnosis not present

## 2018-09-17 MED ORDER — HYDROCODONE-ACETAMINOPHEN 5-325 MG PO TABS: 1.0000 | ORAL_TABLET | Freq: Four times a day (QID) | ORAL | 0 refills | Status: DC | PRN

## 2018-09-17 NOTE — Telephone Encounter (Signed)
Pt is requesting refill on hydrocodone.   Last OV: 08/21/2018 Last Fill: 08/21/2018 #120 and 0RF Pt sig: 1 tab q6h prn UDS: 01/18/2018 Low risk  NCCR in media from 08/21/2018

## 2018-09-17 NOTE — Telephone Encounter (Signed)
A little early but this short week, prescription sent

## 2018-09-24 DIAGNOSIS — M25512 Pain in left shoulder: Secondary | ICD-10-CM | POA: Diagnosis not present

## 2018-09-24 DIAGNOSIS — G8929 Other chronic pain: Secondary | ICD-10-CM | POA: Diagnosis not present

## 2018-09-24 DIAGNOSIS — M25562 Pain in left knee: Secondary | ICD-10-CM | POA: Diagnosis not present

## 2018-09-24 DIAGNOSIS — G894 Chronic pain syndrome: Secondary | ICD-10-CM | POA: Diagnosis not present

## 2018-09-24 DIAGNOSIS — M545 Low back pain: Secondary | ICD-10-CM | POA: Diagnosis not present

## 2018-09-24 DIAGNOSIS — G8921 Chronic pain due to trauma: Secondary | ICD-10-CM | POA: Diagnosis not present

## 2018-09-24 DIAGNOSIS — R2689 Other abnormalities of gait and mobility: Secondary | ICD-10-CM | POA: Diagnosis not present

## 2018-09-24 DIAGNOSIS — M6281 Muscle weakness (generalized): Secondary | ICD-10-CM | POA: Diagnosis not present

## 2018-09-25 DIAGNOSIS — M6281 Muscle weakness (generalized): Secondary | ICD-10-CM | POA: Diagnosis not present

## 2018-09-25 DIAGNOSIS — M25562 Pain in left knee: Secondary | ICD-10-CM | POA: Diagnosis not present

## 2018-09-25 DIAGNOSIS — R2689 Other abnormalities of gait and mobility: Secondary | ICD-10-CM | POA: Diagnosis not present

## 2018-09-25 DIAGNOSIS — G8921 Chronic pain due to trauma: Secondary | ICD-10-CM | POA: Diagnosis not present

## 2018-09-25 DIAGNOSIS — M25512 Pain in left shoulder: Secondary | ICD-10-CM | POA: Diagnosis not present

## 2018-09-25 DIAGNOSIS — G894 Chronic pain syndrome: Secondary | ICD-10-CM | POA: Diagnosis not present

## 2018-09-25 DIAGNOSIS — G8929 Other chronic pain: Secondary | ICD-10-CM | POA: Diagnosis not present

## 2018-09-25 DIAGNOSIS — M545 Low back pain: Secondary | ICD-10-CM | POA: Diagnosis not present

## 2018-10-01 DIAGNOSIS — M545 Low back pain: Secondary | ICD-10-CM | POA: Diagnosis not present

## 2018-10-01 DIAGNOSIS — M25562 Pain in left knee: Secondary | ICD-10-CM | POA: Diagnosis not present

## 2018-10-01 DIAGNOSIS — M6281 Muscle weakness (generalized): Secondary | ICD-10-CM | POA: Diagnosis not present

## 2018-10-01 DIAGNOSIS — G894 Chronic pain syndrome: Secondary | ICD-10-CM | POA: Diagnosis not present

## 2018-10-01 DIAGNOSIS — M25512 Pain in left shoulder: Secondary | ICD-10-CM | POA: Diagnosis not present

## 2018-10-01 DIAGNOSIS — G8929 Other chronic pain: Secondary | ICD-10-CM | POA: Diagnosis not present

## 2018-10-01 DIAGNOSIS — G8921 Chronic pain due to trauma: Secondary | ICD-10-CM | POA: Diagnosis not present

## 2018-10-01 DIAGNOSIS — R2689 Other abnormalities of gait and mobility: Secondary | ICD-10-CM | POA: Diagnosis not present

## 2018-10-02 DIAGNOSIS — R2689 Other abnormalities of gait and mobility: Secondary | ICD-10-CM | POA: Diagnosis not present

## 2018-10-02 DIAGNOSIS — M6281 Muscle weakness (generalized): Secondary | ICD-10-CM | POA: Diagnosis not present

## 2018-10-02 DIAGNOSIS — M25512 Pain in left shoulder: Secondary | ICD-10-CM | POA: Diagnosis not present

## 2018-10-02 DIAGNOSIS — G8929 Other chronic pain: Secondary | ICD-10-CM | POA: Diagnosis not present

## 2018-10-02 DIAGNOSIS — M545 Low back pain: Secondary | ICD-10-CM | POA: Diagnosis not present

## 2018-10-02 DIAGNOSIS — G894 Chronic pain syndrome: Secondary | ICD-10-CM | POA: Diagnosis not present

## 2018-10-02 DIAGNOSIS — M25562 Pain in left knee: Secondary | ICD-10-CM | POA: Diagnosis not present

## 2018-10-02 DIAGNOSIS — G8921 Chronic pain due to trauma: Secondary | ICD-10-CM | POA: Diagnosis not present

## 2018-10-07 ENCOUNTER — Telehealth: Payer: Self-pay | Admitting: Neurology

## 2018-10-07 NOTE — Telephone Encounter (Signed)
noted 

## 2018-10-07 NOTE — Telephone Encounter (Signed)
Patient daughter [name] called in to r/s her appt right now she is scheduled for 01/02/19 she is on cancellation list. She canceled due to her mother may have to fly to [place] due to her sister in the hospital.

## 2018-10-08 DIAGNOSIS — R2689 Other abnormalities of gait and mobility: Secondary | ICD-10-CM | POA: Diagnosis not present

## 2018-10-08 DIAGNOSIS — G8929 Other chronic pain: Secondary | ICD-10-CM | POA: Diagnosis not present

## 2018-10-08 DIAGNOSIS — M25562 Pain in left knee: Secondary | ICD-10-CM | POA: Diagnosis not present

## 2018-10-08 DIAGNOSIS — G8921 Chronic pain due to trauma: Secondary | ICD-10-CM | POA: Diagnosis not present

## 2018-10-08 DIAGNOSIS — G894 Chronic pain syndrome: Secondary | ICD-10-CM | POA: Diagnosis not present

## 2018-10-08 DIAGNOSIS — M545 Low back pain: Secondary | ICD-10-CM | POA: Diagnosis not present

## 2018-10-08 DIAGNOSIS — M25512 Pain in left shoulder: Secondary | ICD-10-CM | POA: Diagnosis not present

## 2018-10-08 DIAGNOSIS — M6281 Muscle weakness (generalized): Secondary | ICD-10-CM | POA: Diagnosis not present

## 2018-10-09 ENCOUNTER — Ambulatory Visit: Payer: Medicare Other | Admitting: Neurology

## 2018-10-11 DIAGNOSIS — M6281 Muscle weakness (generalized): Secondary | ICD-10-CM | POA: Diagnosis not present

## 2018-10-11 DIAGNOSIS — R2689 Other abnormalities of gait and mobility: Secondary | ICD-10-CM | POA: Diagnosis not present

## 2018-10-11 DIAGNOSIS — G8921 Chronic pain due to trauma: Secondary | ICD-10-CM | POA: Diagnosis not present

## 2018-10-11 DIAGNOSIS — M25512 Pain in left shoulder: Secondary | ICD-10-CM | POA: Diagnosis not present

## 2018-10-11 DIAGNOSIS — M545 Low back pain: Secondary | ICD-10-CM | POA: Diagnosis not present

## 2018-10-11 DIAGNOSIS — M25562 Pain in left knee: Secondary | ICD-10-CM | POA: Diagnosis not present

## 2018-10-11 DIAGNOSIS — G894 Chronic pain syndrome: Secondary | ICD-10-CM | POA: Diagnosis not present

## 2018-10-11 DIAGNOSIS — G8929 Other chronic pain: Secondary | ICD-10-CM | POA: Diagnosis not present

## 2018-10-17 ENCOUNTER — Other Ambulatory Visit: Payer: Self-pay | Admitting: Internal Medicine

## 2018-10-17 MED ORDER — MECLIZINE HCL 25 MG PO TABS: 25.0000 mg | ORAL_TABLET | Freq: Two times a day (BID) | ORAL | 1 refills | Status: DC | PRN

## 2018-10-17 NOTE — Telephone Encounter (Signed)
Copied from [place] [phone]. Topic: Quick Communication - Rx Refill/Question >> Oct 17, 2018  1:38 PM Sheran Luz wrote: Medication: HYDROcodone-acetaminophen (NORCO/VICODIN) 5-325 MG tablet and  meclizine (ANTIVERT) 25 MG tablet   Patient is requesting refills of these medications.   Preferred Pharmacy (with phone number or street name): [facility] DR[facility] STORE [place], [address] AT [address] [phone] (Phone) [phone] (Fax)

## 2018-10-17 NOTE — Telephone Encounter (Signed)
Refill Request:Norco/Vicodin  Last RX:09/18/15 Last OV:08/21/18 Next OV:12/20/18 UDS:01/18/18 CSC:07/23/2017 CSR:

## 2018-10-17 NOTE — Telephone Encounter (Signed)
Requested medication (s) are due for refill today: yes  Requested medication (s) are on the active medication list: yes    Last refill: Norco     09/17/18 #120 0 refills    Antivert      09/18/16  #180 1 refill  Future visit scheduled Yes 12/20/2018 Dr. Larose Kells  Notes to clinic:not delegated  Requested Prescriptions  Pending Prescriptions Disp Refills   HYDROcodone-acetaminophen (NORCO/VICODIN) 5-325 MG tablet 120 tablet 0    Sig: Take 1 tablet by mouth every 6 (six) hours as needed for moderate pain.     Not Delegated - Analgesics:  Opioid Agonist Combinations Failed - 10/17/2018  1:45 PM      Failed - This refill cannot be delegated      Failed - Urine Drug Screen completed in last 360 days.      Passed - Valid encounter within last 6 months    Recent Outpatient Visits          1 month ago Essential hypertension   Archivist at [place], MD   5 months ago Essential hypertension   Archivist at [place], MD   9 months ago Essential hypertension   Archivist at [place], MD   12 months ago Urinary tract infection without hematuria, site unspecified   Archivist at [place], MD   1 year ago Essential hypertension   Archivist at [place], MD      Future Appointments            In 2 months Colon Branch, MD [place] at [facility], [facility]          meclizine (ANTIVERT) 25 MG tablet 180 tablet 1    Sig: Take 1 tablet (25 mg total) by mouth 2 (two) times daily as needed for dizziness.     Not Delegated - Gastroenterology: Antiemetics Failed - 10/17/2018  1:45 PM      Failed - This refill cannot be delegated      Passed - Valid encounter within last 6 months    Recent Outpatient Visits          1 month ago Essential hypertension   Arts development officer at [place], MD   5 months ago Essential hypertension   Archivist at [place], MD   9 months ago Essential hypertension   Archivist at [place], MD   12 months ago Urinary tract infection without hematuria, site unspecified   Archivist at [place], MD   1 year ago Essential hypertension   Archivist at [place], MD      Future Appointments            In 2 months Larose Kells, Alda Berthold, MD [employer] at [place]

## 2018-10-18 MED ORDER — HYDROCODONE-ACETAMINOPHEN 5-325 MG PO TABS: 1.0000 | ORAL_TABLET | Freq: Four times a day (QID) | ORAL | 0 refills | Status: DC | PRN

## 2018-10-18 NOTE — Telephone Encounter (Signed)
Sent!

## 2018-10-22 DIAGNOSIS — N200 Calculus of kidney: Secondary | ICD-10-CM | POA: Diagnosis not present

## 2018-10-22 DIAGNOSIS — N3021 Other chronic cystitis with hematuria: Secondary | ICD-10-CM | POA: Diagnosis not present

## 2018-10-29 ENCOUNTER — Other Ambulatory Visit: Payer: Self-pay | Admitting: Internal Medicine

## 2018-10-31 ENCOUNTER — Telehealth: Payer: Self-pay

## 2018-10-31 NOTE — Telephone Encounter (Signed)
Prolia--insurance benefits requested.

## 2018-11-05 NOTE — Telephone Encounter (Signed)
Prolia benefits received PA not required $185 copay met [employer] will cover 20% con-insurance   Patient may owe approximately $0 OOP  Pt due after 12/22/2018   Letter mailed to inform patient of benefits and to schedule

## 2018-11-05 NOTE — Telephone Encounter (Signed)
Prolia in fridge

## 2018-11-13 ENCOUNTER — Telehealth: Payer: Self-pay | Admitting: Neurology

## 2018-11-13 ENCOUNTER — Telehealth: Payer: Self-pay | Admitting: Internal Medicine

## 2018-11-13 DIAGNOSIS — R110 Nausea: Secondary | ICD-10-CM

## 2018-11-13 MED ORDER — ONDANSETRON 4 MG PO TBDP: ORAL_TABLET | ORAL | 0 refills | Status: DC

## 2018-11-13 MED ORDER — HYDROCODONE-ACETAMINOPHEN 5-325 MG PO TABS: 1.0000 | ORAL_TABLET | Freq: Four times a day (QID) | ORAL | 0 refills | Status: DC | PRN

## 2018-11-13 NOTE — Addendum Note (Signed)
Addended by: Gildardo Griffes on: 11/13/2018 03:46 PM   Modules accepted: Orders

## 2018-11-13 NOTE — Telephone Encounter (Signed)
Pt is asking for a refill on her ondansetron (ZOFRAN-ODT) 4 MG disintegrating tablet Pt asking it go to [facility] on [address] in [place] for tomorrow (no changes to insurance)

## 2018-11-13 NOTE — Telephone Encounter (Signed)
Copied from [place] [phone]. Topic: Quick Communication - Rx Refill/Question >> Nov 13, 2018  1:48 PM Margot Ables wrote: Medication: HYDROcodone-acetaminophen (NORCO/VICODIN) 5-325 MG tablet - pt will be in [place] from 1/23-1/27 and then will be going back to her son's house in [facility] after that for a while. Pt asking that it be sent to [place] so that she can pick up while she is here  Has the patient contacted their pharmacy? No - controlled Preferred Pharmacy (with phone number or street name): [facility] DRUG STORE [place], [address] [place] [phone] (Phone) [phone] (Fax)

## 2018-11-13 NOTE — Telephone Encounter (Signed)
Refill sent.

## 2018-11-13 NOTE — Telephone Encounter (Signed)
Sent!

## 2018-11-13 NOTE — Telephone Encounter (Signed)
Pt is requesting refill on hydrocodone.   Last OV: 08/21/2018 Last Fill: 10/18/2018 #120 and 0RF UDS: 01/18/2018 Low risk  NCCR in media 08/21/2018

## 2018-11-18 ENCOUNTER — Ambulatory Visit (INDEPENDENT_AMBULATORY_CARE_PROVIDER_SITE_OTHER): Payer: Medicare Other | Admitting: Orthopedic Surgery

## 2018-11-18 ENCOUNTER — Encounter (INDEPENDENT_AMBULATORY_CARE_PROVIDER_SITE_OTHER): Payer: Self-pay | Admitting: Orthopedic Surgery

## 2018-11-18 DIAGNOSIS — M170 Bilateral primary osteoarthritis of knee: Secondary | ICD-10-CM

## 2018-11-18 DIAGNOSIS — Z89511 Acquired absence of right leg below knee: Secondary | ICD-10-CM

## 2018-11-18 NOTE — Progress Notes (Signed)
Office Visit Note   Patient: [name]           Date of Birth: May 17, 1935           MRN: [id number] Visit Date: 11/18/2018              Requested by: Colon Branch, [address] [place], [place] [address] PCP: Colon Branch, MD  Chief Complaint  Patient presents with  . Left Knee - Pain  . Right Knee - Pain  . Left Arm - Pain  . Right Arm - Pain      HPI: Patient is an 84 year old woman who is now living in [place] she is status post a right transtibial amputation on the right.  Patient states she has recurrent rashes from the irritation from the silicone liner on the right leg.  Patient also complains of osteoarthritis of both knees and is inquiring regarding hyaluronic acid.  Assessment & Plan: Visit Diagnoses:  1. Bilateral primary osteoarthritis of knee   2. History of right below knee amputation ([place])     Plan: Both knees were injected with Depo-Medrol she tolerated this well patient was given a Vive medical compression stump shrinker she is given instructions to fold this down 4 fingerbreadths to allow for a space for the silicone to hold the leg up.  Discussed that if she gets good relief from the steroid injection we could consider either repeat steroid injection or hyaluronic acid injection.  Follow-Up Instructions: Return if symptoms worsen or fail to improve.   Ortho Exam  Patient is alert, oriented, no adenopathy, well-dressed, normal affect, normal respiratory effort. Examination patient has a well-healed residual limb on the right old blistering is visible but she has no active blistering or dermatitis at this time the leg is well consolidated without cellulitis without ulceration without drainage.  Examination of both knees she has crepitation with range of motion is tender to palpation of the patellofemoral joint as well as medial and lateral joint lines bilaterally.  There is no knee effusion no cellulitis collaterals and cruciates are  stable.  Imaging: No results found. No images are attached to the encounter.  Labs: Lab Results  Component Value Date   HGBA1C 6.5 05/20/2018   HGBA1C 6.3 01/18/2018   HGBA1C 5.6 12/14/2016   ESRSEDRATE 9 06/12/2014   ESRSEDRATE 12 05/13/2014   REPTSTATUS 06/10/2014 FINAL 06/08/2014   CULT NO GROWTH Performed at [facility] 06/08/2014   [place] 09/18/2016     Lab Results  Component Value Date   ALBUMIN 3.8 01/18/2018   ALBUMIN 3.3 (L) 09/25/2017   ALBUMIN 4.0 12/14/2016    There is no height or weight on file to calculate BMI.  Orders:  No orders of the defined types were placed in this encounter.  No orders of the defined types were placed in this encounter.    Procedures: Large Joint Inj: bilateral knee on 11/18/2018 1:14 PM Indications: pain and diagnostic evaluation Details: 22 G 1.5 in needle, anteromedial approach  Arthrogram: No  Outcome: tolerated well, no immediate complications Procedure, treatment alternatives, risks and benefits explained, specific risks discussed. Consent was given by the patient. Immediately prior to procedure a time out was called to verify the correct patient, procedure, equipment, support staff and site/side marked as required. Patient was prepped and draped in the usual sterile fashion.      Clinical Data: No additional findings.  ROS:  All other  systems negative, except as noted in the HPI. Review of Systems  Objective: Vital Signs: There were no vitals taken for this visit.  Specialty Comments:  No specialty comments available.  PMFS History: Patient Active Problem List   Diagnosis Date Noted  . External hemorrhoids 02/15/2018  . History of three vessel coronary artery bypass - 2017 , in [facility] 09/25/2017  . PVD (peripheral vascular disease) ([place])-- stent L leg 06-2017 Dr Feliberto [facility] 07/23/2017  . Phantom pain ([place]) 04/01/2017  . Constipation, chronic 04/01/2017  . S/P unilateral BKA  (below knee amputation), right ([place]) 12/14/2016  . Toe osteomyelitis, left ([place]) 11/17/2016  . Acute on chronic diastolic CHF (congestive heart failure), NYHA class 3 ([place]) 03/30/2016  . Chest pain 03/30/2016  . Coronary artery disease due to lipid rich plaque 03/30/2016  . Statin intolerance 03/30/2016  . Acute renal failure superimposed on stage 3 chronic kidney disease ([place]) 03/30/2016  . S/P below knee amputation ([place]) 02/18/2016  . Head trauma 11/26/2015  . Memory loss 11/26/2015  . PCP NOTES >>>>>>>>>>>>>>>>>>>> 10/18/2015  . S/P partial colectomy 01/08/2015  . 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 Anemia 01/18/2014  . Rectal bleeding 10/31/2013  . CAD (coronary artery disease)   . Carcinoma in situ in a polyp   . Depression 09/17/2013  . Charcot's joint 08/21/2013  . Foot osteomyelitis, right ([place]) 07/16/2013  . Chronic pain associated with significant psychosocial dysfunction 05/06/2013  . Neuropathy   . Hyperthyroidism   . Lower extremity edema 03/08/2011  . Annual physical exam 03/08/2011  . ALLERGIC RHINITIS 04/15/2010  . ORTHOSTATIC DIZZINESS 04/13/2010  . HIATAL HERNIA 12/06/2009  . GASTRIC ULCER, HX OF 12/06/2009  . DYSPNEA 03/18/2009  . DEGENERATIVE JOINT DISEASE 06/03/2008  . COLONIC POLYPS 01/27/2008  . DIVERTICULOSIS, COLON 01/27/2008  . IRRITABLE BOWEL SYNDROME, HX OF 01/27/2008  . Pulmonary nodule, right 01/22/2008  . Hyperlipidemia 12/30/2007  . Osteoporosis 12/30/2007  . TB SKIN TEST, POSITIVE 08/09/2007  . DM type 2 with diabetic peripheral neuropathy ([place]) 05/09/2007  . Reflex sympathetic dystrophy-- PAIN MNGMT  05/09/2007  . Essential hypertension 04/15/2007  . GERD 04/15/2007   Past Medical History:  Diagnosis Date  . Abnormal echocardiogram    09/2013: mild LVH, mild focal basal hypertrophy of septum, EF 60-65%, normal WM, grade 1 diastolic dysfunction, MAC, mild LAE, ASA, PASP 34. Possible oscillating MV density -  reviewed by MD and felt there was no significant abnormality other than MAC noted with mitral valve and did not require TEE.  . Adenomatous colon polyp   . Allergic rhinitis   . Arthritis    "back; ankles; hands; knees" (10/31/2013)  . Bilateral sensorineural hearing loss   . CAD (coronary artery disease)    a. Nonobst in 2011. b. Abnormal nuc 09/2013 -> s/p cutting balloon to D2, mild LAD disease; c. 08/2015 MV: no ischemia, EF 71%.  . Cancer ([place])    cancerous polyps  . Carcinoma in situ in a polyp 1994   a. 1994 - malignant polyp removed during colonoscopy.  . Charcot's Foot    a.  01/2016 s/p RLE transtibial amputation 2/2 Charcot rocker-bottom deformity and insensate neuropathy ulceration.  . Chronic diastolic CHF (congestive heart failure) ([place])    a. 09/2013 Echo: EF 60-65%, mild LVH, Gr1 DD.  Marland Kitchen Diverticulosis   . DJD (degenerative joint disease)   . DVT (deep venous thrombosis) ([place]) 12/2007  . Dyspnea    a. Chronic, extensive w/u see OV note  09-2010. b. [place] 2011: ormal with RA 5 RV 31/2 PA 27/12 (19) PCW 10 CO normal. No evidence of shunting with sitting up in cath lab. c. CPX 2011: see report. d. Prior fluoro of diaphragm -  R diaphragm elevated at rest but both moved with inspiration.   . Fatty liver   . GERD (gastroesophageal reflux disease)    a. Hx GERD/esophageal dysmotility followed by Dr. Olevia Perches.   Marland Kitchen Head injury due to trauma   . Headache    Optic migraine  . Hemorrhoids   . Hiatal hernia    a. s/p Nissen fundoplication 6226.  Marland Kitchen Hyperlipidemia    a. patient unwilling to use statins.  . Hypertensive heart disease   . IBS (irritable bowel syndrome)   . Macular degeneration 03/2009   Dr. Rosana Hoes  . Myocardial infarction ([place]) 2017  . Neuropathy    a. Hands, feet, legs.  . Orthostatic hypotension   . Osteomyelitis ([place])    a. 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 01/2016 s/p RLE transtibial amputation 2/2  Charcot rocker-bottom deformity and insensate neuropathy ulceration.  . Osteoporosis   . PE (pulmonary embolism) 12/2007   a. PE/DVT after neck surgery 2009. b. coumadin d/c 10-2008.  Marland Kitchen Pneumonia 2015ish  . PONV (postoperative nausea and vomiting) 2009   neck surgery  . PPD positive   . Pulmonary nodule    incidental per CT:  Pet scan 4-9: likely benign, CT 08-2009 no change, no further CTs (Dr. Gwenette Greet)  . Recurrent UTI   . RSD (reflex sympathetic dystrophy)    a. Chronic pain.  Marland Kitchen Spinal stenosis   . Type II diabetes mellitus (HCC)    no on medication  . Venous insufficiency    a. Contributing to LEE.    Family History  Problem Relation Age of Onset  . Heart disease Mother        mitral valve replaced  . Arthritis Mother   . Diabetic kidney disease Daughter   . Diabetes Father   . Stroke Father   . Migraines Sister   . Hypertension Unknown   . Breast cancer Unknown   . Headache Sister   . Colon cancer Neg Hx   . Esophageal cancer Neg Hx   . Stomach cancer Neg Hx   . Rectal cancer Neg Hx     Past Surgical History:  Procedure Laterality Date  . AMPUTATION  03/06/2012   Procedure: AMPUTATION FOOT;  Surgeon: Newt Minion, MD;  Location: [facility];  Service: Orthopedics;  Laterality: Left;  FIFTH RAY AMPUTATION   . AMPUTATION Right 02/18/2016   Procedure: AMPUTATION BELOW KNEE;  Surgeon: Newt Minion, MD;  Location: [facility];  Service: Orthopedics;  Laterality: Right;  . AMPUTATION Left 11/22/2016   Procedure: Amputation 4th Toe Left Foot at Metatarsophalangeal Joint;  Surgeon: Newt Minion, MD;  Location: [facility];  Service: Orthopedics;  Laterality: Left;  . ANKLE FUSION  09/27/2012   Procedure: ANKLE FUSION;  Surgeon: Newt Minion, MD;  Location: [facility];  Service: Orthopedics;  Laterality: Left;  Left Tibiocalcaneal Fusion  . ANKLE FUSION Right 05/09/2013   Procedure: ANKLE FUSION;  Surgeon: Newt Minion, MD;  Location: [facility];  Service: Orthopedics;  Laterality: Right;  Excision  Osteomyelitis Base 1st MT Right Foot, Fusion Medial Column  . ANTERIOR CERVICAL DECOMP/DISCECTOMY FUSION  12/18/07   For OA,  Dr. Lorin Mercy:  fu by a PE  .  CARDIAC CATHETERIZATION  05/2010    at [facility]  . CARPAL TUNNEL RELEASE Left 09/2011  . CATARACT EXTRACTION W/ INTRAOCULAR LENS  IMPLANT, BILATERAL  2004   feb 2004 left, aug 2004 right  . CHOLECYSTECTOMY  03/2002  . CORONARY ANGIOPLASTY  10/31/2013  . CORONARY ARTERY BYPASS GRAFT  09/27/2016  . CYST REMOVAL HAND  06/2003  . FOOT BONE EXCISION Right 06/2009  . LAPAROSCOPIC RIGHT HEMI COLECTOMY N/A 01/08/2015   Procedure: LAPAROSCOPIC ASSISTED RIGHT HEMI COLECTOMY;  Surgeon: Johnathan Hausen, MD;  [facility];  Service: General;  Laterality: N/A;  . LEFT HEART CATHETERIZATION WITH CORONARY ANGIOGRAM N/A 10/31/2013   Procedure: LEFT HEART CATHETERIZATION WITH CORONARY ANGIOGRAM;  Surgeon: Jettie Booze, MD;  Location: [facility] CATH LAB;  Service: Cardiovascular;  Laterality: N/A;  . NASAL SEPTUM SURGERY  09/1963  . NISSEN FUNDOPLICATION  04/29/2422  . PERCUTANEOUS CORONARY INTERVENTION-BALLOON ONLY  10/31/2013   Procedure: PERCUTANEOUS CORONARY INTERVENTION-BALLOON ONLY;  Surgeon: Jettie Booze, MD;  Location: [facility] CATH LAB;  Service: Cardiovascular;;  . ROTATOR CUFF REPAIR Right 07/2007   Dr. Percell Miller  . ROTATOR CUFF REPAIR Left 11/2004  . stent in left leg, behind knee  06/27/2017   x2, done at [facility] in [facility]  . TOE AMPUTATION Right 08/2008   3rd toe, Dr. Sharol Given due to osteomyelitis  . VAGINAL HYSTERECTOMY  03/1975  . VEIN LIGATION Bilateral 03/1966  . VENA CAVA FILTER PLACEMENT  01/2010   green filter; "due to blood clots"  . WISDOM TOOTH EXTRACTION  06/2007   Social History   Occupational History  . Occupation: Retired     Comment: from Orthoptist   Tobacco Use  . Smoking status: Never Smoker  . Smokeless tobacco: Never Used  . Tobacco comment: tried for a few months in college.   Substance and Sexual Activity   . Alcohol use: No    Alcohol/week: 0.0 standard drinks  . Drug use: No  . Sexual activity: Not Currently    Birth control/protection: Post-menopausal

## 2018-11-25 ENCOUNTER — Ambulatory Visit (INDEPENDENT_AMBULATORY_CARE_PROVIDER_SITE_OTHER): Payer: Medicare Other | Admitting: Physician Assistant

## 2018-11-25 ENCOUNTER — Encounter (INDEPENDENT_AMBULATORY_CARE_PROVIDER_SITE_OTHER): Payer: Self-pay | Admitting: Physician Assistant

## 2018-11-25 VITALS — Ht 64.0 in | Wt 196.0 lb

## 2018-11-25 DIAGNOSIS — Z89511 Acquired absence of right leg below knee: Secondary | ICD-10-CM | POA: Diagnosis not present

## 2018-11-25 NOTE — Progress Notes (Signed)
Office Visit Note   Patient: [name]           Date of Birth: [birth year]           MRN: [id number] Visit Date: 11/25/2018              Requested by: Colon Branch, [address] [place], [place] [address] PCP: Colon Branch, MD  Chief Complaint  Patient presents with  . Right Leg - Follow-up      HPI: Patient is an 84 year old woman who presents in follow-up for right transtibial amputation.  She is developed an acute blister on the residual limb.  She has had significant decrease the residual volume.  Assessment & Plan: Visit Diagnoses:  1. History of right below knee amputation [facility])     Plan: Patient is given a prescription to work with her orthotist in [place] for either a new socket or medial and lateral padding.  Patient is an bearing on the residual limb at this time.  She was given a vive extra-large stump shrinker to apply against the skin.  After application this did make her leg feel better.  We will follow-up as needed she will follow-up with her orthotist.  Follow-Up Instructions: Return if symptoms worsen or fail to improve.   Ortho Exam  Patient is alert, oriented, no adenopathy, well-dressed, normal affect, normal respiratory effort. Examination patient has significant decreased volume of the residual limb.  There is no redness no cellulitis no signs of infection.  She has 2 very small blisters that are 5 mm in diameter 0.1 mm deep with no exposed bone or tendon.  Imaging: No results found. No images are attached to the encounter.  Labs: Lab Results  Component Value Date   HGBA1C 6.5 05/20/2018   HGBA1C 6.3 01/18/2018   HGBA1C 5.6 12/14/2016   ESRSEDRATE 9 06/12/2014   ESRSEDRATE 12 05/13/2014   REPTSTATUS 06/10/2014 FINAL 06/08/2014   CULT NO GROWTH Performed at [facility] 06/08/2014   [place] 09/18/2016     Lab Results  Component Value Date   ALBUMIN 3.8 01/18/2018   ALBUMIN 3.3 (L)  09/25/2017   ALBUMIN 4.0 12/14/2016    Body mass index is 33.64 kg/m.  Orders:  No orders of the defined types were placed in this encounter.  No orders of the defined types were placed in this encounter.    Procedures: No procedures performed  Clinical Data: No additional findings.  ROS:  All other systems negative, except as noted in the HPI. Review of Systems  Objective: Vital Signs: Ht 5' 4"  (1.626 m)   Wt 196 lb (88.9 kg)   BMI 33.64 kg/m   Specialty Comments:  No specialty comments available.  PMFS History: Patient Active Problem List   Diagnosis Date Noted  . External hemorrhoids 02/15/2018  . History of three vessel coronary artery bypass - 2017 , in [facility] 09/25/2017  . PVD (peripheral vascular disease) ([place])-- stent L leg 06-2017 Dr Feliberto [facility] 07/23/2017  . Phantom pain ([place]) 04/01/2017  . Constipation, chronic 04/01/2017  . S/P unilateral BKA (below knee amputation), right ([place]) 12/14/2016  . Toe osteomyelitis, left ([place]) 11/17/2016  . Acute on chronic diastolic CHF (congestive heart failure), NYHA class 3 ([place]) 03/30/2016  . Chest pain 03/30/2016  . Coronary artery disease due to lipid rich plaque 03/30/2016  . Statin intolerance 03/30/2016  . Acute renal failure superimposed on stage 3 chronic kidney disease ([place])  03/30/2016  . S/P below knee amputation ([place]) 02/18/2016  . Head trauma 11/26/2015  . Memory loss 11/26/2015  . PCP NOTES >>>>>>>>>>>>>>>>>>>> 10/18/2015  . S/P partial colectomy 01/08/2015  . 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 Anemia 01/18/2014  . Rectal bleeding 10/31/2013  . CAD (coronary artery disease)   . Carcinoma in situ in a polyp   . Depression 09/17/2013  . Charcot's joint 08/21/2013  . Foot osteomyelitis, right ([place]) 07/16/2013  . Chronic pain associated with significant psychosocial dysfunction 05/06/2013  . Neuropathy   . Hyperthyroidism   . Lower extremity edema 03/08/2011  . Annual  physical exam 03/08/2011  . ALLERGIC RHINITIS 04/15/2010  . ORTHOSTATIC DIZZINESS 04/13/2010  . HIATAL HERNIA 12/06/2009  . GASTRIC ULCER, HX OF 12/06/2009  . DYSPNEA 03/18/2009  . DEGENERATIVE JOINT DISEASE 06/03/2008  . COLONIC POLYPS 01/27/2008  . DIVERTICULOSIS, COLON 01/27/2008  . IRRITABLE BOWEL SYNDROME, HX OF 01/27/2008  . Pulmonary nodule, right 01/22/2008  . Hyperlipidemia 12/30/2007  . Osteoporosis 12/30/2007  . TB SKIN TEST, POSITIVE 08/09/2007  . DM type 2 with diabetic peripheral neuropathy ([place]) 05/09/2007  . Reflex sympathetic dystrophy-- PAIN MNGMT  05/09/2007  . Essential hypertension 04/15/2007  . GERD 04/15/2007   Past Medical History:  Diagnosis Date  . Abnormal echocardiogram    09/2013: mild LVH, mild focal basal hypertrophy of septum, EF 60-65%, normal WM, grade 1 diastolic dysfunction, MAC, mild LAE, ASA, PASP 34. Possible oscillating MV density - reviewed by MD and felt there was no significant abnormality other than MAC noted with mitral valve and did not require TEE.  . Adenomatous colon polyp   . Allergic rhinitis   . Arthritis    "back; ankles; hands; knees" (10/31/2013)  . Bilateral sensorineural hearing loss   . CAD (coronary artery disease)    a. Nonobst in 2011. b. Abnormal nuc 09/2013 -> s/p cutting balloon to D2, mild LAD disease; c. 08/2015 MV: no ischemia, EF 71%.  . Cancer ([place])    cancerous polyps  . Carcinoma in situ in a polyp 1994   a. 1994 - malignant polyp removed during colonoscopy.  . Charcot's Foot    a.  01/2016 s/p RLE transtibial amputation 2/2 Charcot rocker-bottom deformity and insensate neuropathy ulceration.  . Chronic diastolic CHF (congestive heart failure) ([place])    a. 09/2013 Echo: EF 60-65%, mild LVH, Gr1 DD.  Marland Kitchen Diverticulosis   . DJD (degenerative joint disease)   . DVT (deep venous thrombosis) ([place]) 12/2007  . Dyspnea    a. Chronic, extensive w/u see OV note 09-2010. b. [place] 2011: ormal with RA 5 RV 31/2 PA 27/12 (19)  PCW 10 CO normal. No evidence of shunting with sitting up in cath lab. c. CPX 2011: see report. d. Prior fluoro of diaphragm -  R diaphragm elevated at rest but both moved with inspiration.   . Fatty liver   . GERD (gastroesophageal reflux disease)    a. Hx GERD/esophageal dysmotility followed by Dr. Olevia Perches.   Marland Kitchen Head injury due to trauma   . Headache    Optic migraine  . Hemorrhoids   . Hiatal hernia    a. s/p Nissen fundoplication 5993.  Marland Kitchen Hyperlipidemia    a. patient unwilling to use statins.  . Hypertensive heart disease   . IBS (irritable bowel syndrome)   . Macular degeneration 03/2009   Dr. Rosana Hoes  . Myocardial infarction ([place]) 2017  . Neuropathy    a. Hands, feet, legs.  Marland Kitchen  Orthostatic hypotension   . Osteomyelitis ([place])    a. 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 01/2016 s/p RLE transtibial amputation 2/2 Charcot rocker-bottom deformity and insensate neuropathy ulceration.  . Osteoporosis   . PE (pulmonary embolism) 12/2007   a. PE/DVT after neck surgery 2009. b. coumadin d/c 10-2008.  Marland Kitchen Pneumonia 2015ish  . PONV (postoperative nausea and vomiting) 2009   neck surgery  . PPD positive   . Pulmonary nodule    incidental per CT:  Pet scan 4-9: likely benign, CT 08-2009 no change, no further CTs (Dr. Gwenette Greet)  . Recurrent UTI   . RSD (reflex sympathetic dystrophy)    a. Chronic pain.  Marland Kitchen Spinal stenosis   . Type II diabetes mellitus (H[facility])    no on medication  . Venous insufficiency    a. Contributing to LEE.    Family History  Problem Relation Age of Onset  . Heart disease Mother        mitral valve replaced  . Arthritis Mother   . Diabetic kidney disease Daughter   . Diabetes Father   . Stroke Father   . Migraines Sister   . Hypertension Unknown   . Breast cancer Unknown   . Headache Sister   . Colon cancer Neg Hx   . Esophageal cancer Neg Hx   . Stomach cancer Neg Hx   . Rectal cancer Neg Hx     Past  Surgical History:  Procedure Laterality Date  . AMPUTATION  03/06/2012   Procedure: AMPUTATION FOOT;  Surgeon: Newt Minion, MD;  Location: [facility];  Service: Orthopedics;  Laterality: Left;  FIFTH RAY AMPUTATION   . AMPUTATION Right 02/18/2016   Procedure: AMPUTATION BELOW KNEE;  Surgeon: Newt Minion, MD;  Location: [facility];  Service: Orthopedics;  Laterality: Right;  . AMPUTATION Left 11/22/2016   Procedure: Amputation 4th Toe Left Foot at Metatarsophalangeal Joint;  Surgeon: Newt Minion, MD;  Location: [facility];  Service: Orthopedics;  Laterality: Left;  . ANKLE FUSION  09/27/2012   Procedure: ANKLE FUSION;  Surgeon: Newt Minion, MD;  Location: [facility];  Service: Orthopedics;  Laterality: Left;  Left Tibiocalcaneal Fusion  . ANKLE FUSION Right 05/09/2013   Procedure: ANKLE FUSION;  Surgeon: Newt Minion, MD;  Location: [facility];  Service: Orthopedics;  Laterality: Right;  Excision Osteomyelitis Base 1st MT Right Foot, Fusion Medial Column  . ANTERIOR CERVICAL DECOMP/DISCECTOMY FUSION  12/18/07   For OA,  Dr. Lorin Mercy:  fu by a PE  . CARDIAC CATHETERIZATION  05/2010    at [facility]  . CARPAL TUNNEL RELEASE Left 09/2011  . CATARACT EXTRACTION W/ INTRAOCULAR LENS  IMPLANT, BILATERAL  2004   feb 2004 left, aug 2004 right  . CHOLECYSTECTOMY  03/2002  . CORONARY ANGIOPLASTY  10/31/2013  . CORONARY ARTERY BYPASS GRAFT  09/27/2016  . CYST REMOVAL HAND  06/2003  . FOOT BONE EXCISION Right 06/2009  . LAPAROSCOPIC RIGHT HEMI COLECTOMY N/A 01/08/2015   Procedure: LAPAROSCOPIC ASSISTED RIGHT HEMI COLECTOMY;  Surgeon: Johnathan Hausen, MD;  [facility];  Service: General;  Laterality: N/A;  . LEFT HEART CATHETERIZATION WITH CORONARY ANGIOGRAM N/A 10/31/2013   Procedure: LEFT HEART CATHETERIZATION WITH CORONARY ANGIOGRAM;  Surgeon: Jettie Booze, MD;  Location: [facility] CATH LAB;  Service: Cardiovascular;  Laterality: N/A;  . NASAL SEPTUM SURGERY  09/1963  . NISSEN FUNDOPLICATION  07/01/2425  . PERCUTANEOUS CORONARY  INTERVENTION-BALLOON ONLY  10/31/2013   Procedure: PERCUTANEOUS CORONARY INTERVENTION-BALLOON ONLY;  Surgeon: Jettie Booze, MD;  Location: [facility] CATH LAB;  Service: Cardiovascular;;  . ROTATOR CUFF REPAIR Right 07/2007   Dr. Percell Miller  . ROTATOR CUFF REPAIR Left 11/2004  . stent in left leg, behind knee  06/27/2017   x2, done at [facility]ology in [facility]  . TOE AMPUTATION Right 08/2008   3rd toe, Dr. Sharol Given due to osteomyelitis  . VAGINAL HYSTERECTOMY  03/1975  . VEIN LIGATION Bilateral 03/1966  . VENA CAVA FILTER PLACEMENT  01/2010   green filter; "due to blood clots"  . WISDOM TOOTH EXTRACTION  06/2007   Social History   Occupational History  . Occupation: Retired     Comment: from Orthoptist   Tobacco Use  . Smoking status: Never Smoker  . Smokeless tobacco: Never Used  . Tobacco comment: tried for a few months in college.   Substance and Sexual Activity  . Alcohol use: No    Alcohol/week: 0.0 standard drinks  . Drug use: No  . Sexual activity: Not Currently    Birth control/protection: Post-menopausal

## 2018-12-02 DIAGNOSIS — G894 Chronic pain syndrome: Secondary | ICD-10-CM | POA: Diagnosis not present

## 2018-12-02 DIAGNOSIS — R2689 Other abnormalities of gait and mobility: Secondary | ICD-10-CM | POA: Diagnosis not present

## 2018-12-02 DIAGNOSIS — M6281 Muscle weakness (generalized): Secondary | ICD-10-CM | POA: Diagnosis not present

## 2018-12-02 DIAGNOSIS — G8921 Chronic pain due to trauma: Secondary | ICD-10-CM | POA: Diagnosis not present

## 2018-12-02 DIAGNOSIS — M25562 Pain in left knee: Secondary | ICD-10-CM | POA: Diagnosis not present

## 2018-12-02 DIAGNOSIS — G8929 Other chronic pain: Secondary | ICD-10-CM | POA: Diagnosis not present

## 2018-12-02 DIAGNOSIS — M545 Low back pain: Secondary | ICD-10-CM | POA: Diagnosis not present

## 2018-12-02 DIAGNOSIS — M25512 Pain in left shoulder: Secondary | ICD-10-CM | POA: Diagnosis not present

## 2018-12-05 DIAGNOSIS — G8921 Chronic pain due to trauma: Secondary | ICD-10-CM | POA: Diagnosis not present

## 2018-12-05 DIAGNOSIS — G8929 Other chronic pain: Secondary | ICD-10-CM | POA: Diagnosis not present

## 2018-12-05 DIAGNOSIS — M545 Low back pain: Secondary | ICD-10-CM | POA: Diagnosis not present

## 2018-12-05 DIAGNOSIS — M25512 Pain in left shoulder: Secondary | ICD-10-CM | POA: Diagnosis not present

## 2018-12-05 DIAGNOSIS — M6281 Muscle weakness (generalized): Secondary | ICD-10-CM | POA: Diagnosis not present

## 2018-12-05 DIAGNOSIS — G894 Chronic pain syndrome: Secondary | ICD-10-CM | POA: Diagnosis not present

## 2018-12-05 DIAGNOSIS — R2689 Other abnormalities of gait and mobility: Secondary | ICD-10-CM | POA: Diagnosis not present

## 2018-12-05 DIAGNOSIS — M25562 Pain in left knee: Secondary | ICD-10-CM | POA: Diagnosis not present

## 2018-12-09 ENCOUNTER — Telehealth (INDEPENDENT_AMBULATORY_CARE_PROVIDER_SITE_OTHER): Payer: Self-pay | Admitting: Orthopedic Surgery

## 2018-12-09 DIAGNOSIS — G8921 Chronic pain due to trauma: Secondary | ICD-10-CM | POA: Diagnosis not present

## 2018-12-09 DIAGNOSIS — M6281 Muscle weakness (generalized): Secondary | ICD-10-CM | POA: Diagnosis not present

## 2018-12-09 DIAGNOSIS — M25512 Pain in left shoulder: Secondary | ICD-10-CM | POA: Diagnosis not present

## 2018-12-09 DIAGNOSIS — G894 Chronic pain syndrome: Secondary | ICD-10-CM | POA: Diagnosis not present

## 2018-12-09 DIAGNOSIS — M25562 Pain in left knee: Secondary | ICD-10-CM | POA: Diagnosis not present

## 2018-12-09 DIAGNOSIS — G8929 Other chronic pain: Secondary | ICD-10-CM | POA: Diagnosis not present

## 2018-12-09 DIAGNOSIS — M545 Low back pain: Secondary | ICD-10-CM | POA: Diagnosis not present

## 2018-12-09 DIAGNOSIS — R2689 Other abnormalities of gait and mobility: Secondary | ICD-10-CM | POA: Diagnosis not present

## 2018-12-09 NOTE — Telephone Encounter (Signed)
Patient's daughter [name]) called asked why did Dr Sharol Given put patient in the swinker and if she is suppose to keep it on. The number to contact [name] is [phone] or [phone]

## 2018-12-11 NOTE — Telephone Encounter (Signed)
I called and advised that this was to treat a blister that she had on her residual limb. Pt's daughter states that she is unsure if the pt is to continue wearing this and that it has caused volume loss in the limb and that she will need a new socket. I advised at the last office visit that an rx had been given for a new socket if needed or padding to the current one whichever the orthotist thought would be best for the pt. Pt's daughter states that she has an appt next week and will discuss with Dr. Sharol Given.

## 2018-12-12 DIAGNOSIS — M6281 Muscle weakness (generalized): Secondary | ICD-10-CM | POA: Diagnosis not present

## 2018-12-12 DIAGNOSIS — M25562 Pain in left knee: Secondary | ICD-10-CM | POA: Diagnosis not present

## 2018-12-12 DIAGNOSIS — M545 Low back pain: Secondary | ICD-10-CM | POA: Diagnosis not present

## 2018-12-12 DIAGNOSIS — G894 Chronic pain syndrome: Secondary | ICD-10-CM | POA: Diagnosis not present

## 2018-12-12 DIAGNOSIS — G8929 Other chronic pain: Secondary | ICD-10-CM | POA: Diagnosis not present

## 2018-12-12 DIAGNOSIS — G8921 Chronic pain due to trauma: Secondary | ICD-10-CM | POA: Diagnosis not present

## 2018-12-12 DIAGNOSIS — M25512 Pain in left shoulder: Secondary | ICD-10-CM | POA: Diagnosis not present

## 2018-12-12 DIAGNOSIS — R2689 Other abnormalities of gait and mobility: Secondary | ICD-10-CM | POA: Diagnosis not present

## 2018-12-16 ENCOUNTER — Ambulatory Visit (INDEPENDENT_AMBULATORY_CARE_PROVIDER_SITE_OTHER): Payer: Medicare Other | Admitting: Orthopedic Surgery

## 2018-12-16 ENCOUNTER — Encounter (INDEPENDENT_AMBULATORY_CARE_PROVIDER_SITE_OTHER): Payer: Self-pay | Admitting: Orthopedic Surgery

## 2018-12-16 VITALS — Ht 64.0 in | Wt 196.0 lb

## 2018-12-16 DIAGNOSIS — Z89511 Acquired absence of right leg below knee: Secondary | ICD-10-CM | POA: Diagnosis not present

## 2018-12-16 NOTE — Progress Notes (Signed)
Office Visit Note   Patient: [name]           Date of Birth: 18-Feb-1935           MRN: [id number] Visit Date: 12/16/2018              Requested by: Colon Branch, [address] [place], [place] [address] PCP: Colon Branch, MD  Chief Complaint  Patient presents with  . Left Foot - Follow-up    11/22/2016 left foot 4th toe      HPI: Patient is a 84 year old woman who is status post a right transtibial amputation.  She was having end bearing ulceration she started wearing the stump shrinker and at this point she has had significant loss of residual volume and despite wearing extra ply stockings she still has instability and is still in bearing on her residual limb with pain and ulceration.  Assessment & Plan: Visit Diagnoses:  1. History of right below knee amputation [facility])     Plan: Patient was given a prescription for her orthotist in [place].  She will need a new socket new liner new materials new supplies.  Patient will need more of the Vive stump shrinker's.  Patient was given a prescription with the ICD 10 code as well as information for prosthetic revision.  Follow-Up Instructions: Return if symptoms worsen or fail to improve.   Ortho Exam  Patient is alert, oriented, no adenopathy, well-dressed, normal affect, normal respiratory effort. Examination patient's right lower extremity has some redness a healed ulcer on the residual limb.  She has had significant decrease in volume.  She has been end bearing on the residual wound there is no redness no cellulitis no signs of infection.  Imaging: No results found. No images are attached to the encounter.  Labs: Lab Results  Component Value Date   HGBA1C 6.5 05/20/2018   HGBA1C 6.3 01/18/2018   HGBA1C 5.6 12/14/2016   ESRSEDRATE 9 06/12/2014   ESRSEDRATE 12 05/13/2014   REPTSTATUS 06/10/2014 FINAL 06/08/2014   CULT NO GROWTH Performed at [facility] 06/08/2014   [place] 09/18/2016     Lab Results  Component Value Date   ALBUMIN 3.8 01/18/2018   ALBUMIN 3.3 (L) 09/25/2017   ALBUMIN 4.0 12/14/2016    Body mass index is 33.64 kg/m.  Orders:  No orders of the defined types were placed in this encounter.  No orders of the defined types were placed in this encounter.    Procedures: No procedures performed  Clinical Data: No additional findings.  ROS:  All other systems negative, except as noted in the HPI. Review of Systems  Objective: Vital Signs: Ht _0  (1.626 m)   Wt 196 lb (88.9 kg)   BMI 33.64 kg/m   Specialty Comments:  No specialty comments available.  PMFS History: Patient Active Problem List   Diagnosis Date Noted  . External hemorrhoids 02/15/2018  . History of three vessel coronary artery bypass - 2017 , in [facility] 09/25/2017  . PVD (peripheral vascular disease) ([place])-- stent L leg 06-2017 Dr Feliberto [facility] 07/23/2017  . Phantom pain ([place]) 04/01/2017  . Constipation, chronic 04/01/2017  . S/P unilateral BKA (below knee amputation), right ([place]) 12/14/2016  . Toe osteomyelitis, left ([place]) 11/17/2016  . Acute on chronic diastolic CHF (congestive heart failure), NYHA class 3 ([place]) 03/30/2016  . Chest pain 03/30/2016  . Coronary artery disease due to lipid rich  plaque 03/30/2016  . Statin intolerance 03/30/2016  . Acute renal failure superimposed on stage 3 chronic kidney disease ([place]) 03/30/2016  . S/P below knee amputation ([place]) 02/18/2016  . Head trauma 11/26/2015  . Memory loss 11/26/2015  . PCP NOTES >>>>>>>>>>>>>>>>>>>> 10/18/2015  . S/P partial colectomy 01/08/2015  . 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 Anemia 01/18/2014  . Rectal bleeding 10/31/2013  . CAD (coronary artery disease)   . Carcinoma in situ in a polyp   . Depression 09/17/2013  . Charcot's joint 08/21/2013  . Foot osteomyelitis, right ([place]) 07/16/2013  . Chronic pain associated with significant  psychosocial dysfunction 05/06/2013  . Neuropathy   . Hyperthyroidism   . Lower extremity edema 03/08/2011  . Annual physical exam 03/08/2011  . ALLERGIC RHINITIS 04/15/2010  . ORTHOSTATIC DIZZINESS 04/13/2010  . HIATAL HERNIA 12/06/2009  . GASTRIC ULCER, HX OF 12/06/2009  . DYSPNEA 03/18/2009  . DEGENERATIVE JOINT DISEASE 06/03/2008  . COLONIC POLYPS 01/27/2008  . DIVERTICULOSIS, COLON 01/27/2008  . IRRITABLE BOWEL SYNDROME, HX OF 01/27/2008  . Pulmonary nodule, right 01/22/2008  . Hyperlipidemia 12/30/2007  . Osteoporosis 12/30/2007  . TB SKIN TEST, POSITIVE 08/09/2007  . DM type 2 with diabetic peripheral neuropathy ([place]) 05/09/2007  . Reflex sympathetic dystrophy-- PAIN MNGMT  05/09/2007  . Essential hypertension 04/15/2007  . GERD 04/15/2007   Past Medical History:  Diagnosis Date  . Abnormal echocardiogram    09/2013: mild LVH, mild focal basal hypertrophy of septum, EF 60-65%, normal WM, grade 1 diastolic dysfunction, MAC, mild LAE, ASA, PASP 34. Possible oscillating MV density - reviewed by MD and felt there was no significant abnormality other than MAC noted with mitral valve and did not require TEE.  . Adenomatous colon polyp   . Allergic rhinitis   . Arthritis    "back; ankles; hands; knees" (10/31/2013)  . Bilateral sensorineural hearing loss   . CAD (coronary artery disease)    a. Nonobst in 2011. b. Abnormal nuc 09/2013 -> s/p cutting balloon to D2, mild LAD disease; c. 08/2015 MV: no ischemia, EF 71%.  . Cancer ([place])    cancerous polyps  . Carcinoma in situ in a polyp 1994   a. 1994 - malignant polyp removed during colonoscopy.  . Charcot's Foot    a.  01/2016 s/p RLE transtibial amputation 2/2 Charcot rocker-bottom deformity and insensate neuropathy ulceration.  . Chronic diastolic CHF (congestive heart failure) ([place])    a. 09/2013 Echo: EF 60-65%, mild LVH, Gr1 DD.  Marland Kitchen Diverticulosis   . DJD (degenerative joint disease)   . DVT (deep venous thrombosis) ([place])  12/2007  . Dyspnea    a. Chronic, extensive w/u see OV note 09-2010. b. [place] 2011: ormal with RA 5 RV 31/2 PA 27/12 (19) PCW 10 CO normal. No evidence of shunting with sitting up in cath lab. c. CPX 2011: see report. d. Prior fluoro of diaphragm -  R diaphragm elevated at rest but both moved with inspiration.   . Fatty liver   . GERD (gastroesophageal reflux disease)    a. Hx GERD/esophageal dysmotility followed by Dr. Olevia Perches.   Marland Kitchen Head injury due to trauma   . Headache    Optic migraine  . Hemorrhoids   . Hiatal hernia    a. s/p Nissen fundoplication 4782.  Marland Kitchen Hyperlipidemia    a. patient unwilling to use statins.  . Hypertensive heart disease   . IBS (irritable bowel syndrome)   . Macular degeneration 03/2009  Dr. Rosana Hoes  . Myocardial infarction ([place]) 2017  . Neuropathy    a. Hands, feet, legs.  . Orthostatic hypotension   . Osteomyelitis ([place])    a. 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 01/2016 s/p RLE transtibial amputation 2/2 Charcot rocker-bottom deformity and insensate neuropathy ulceration.  . Osteoporosis   . PE (pulmonary embolism) 12/2007   a. PE/DVT after neck surgery 2009. b. coumadin d/c 10-2008.  Marland Kitchen Pneumonia 2015ish  . PONV (postoperative nausea and vomiting) 2009   neck surgery  . PPD positive   . Pulmonary nodule    incidental per CT:  Pet scan 4-9: likely benign, CT 08-2009 no change, no further CTs (Dr. Gwenette Greet)  . Recurrent UTI   . RSD (reflex sympathetic dystrophy)    a. Chronic pain.  Marland Kitchen Spinal stenosis   . Type II diabetes mellitus (H[facility])    no on medication  . Venous insufficiency    a. Contributing to LEE.    Family History  Problem Relation Age of Onset  . Heart disease Mother        mitral valve replaced  . Arthritis Mother   . Diabetic kidney disease Daughter   . Diabetes Father   . Stroke Father   . Migraines Sister   . Hypertension Unknown   . Breast cancer Unknown   . Headache Sister    . Colon cancer Neg Hx   . Esophageal cancer Neg Hx   . Stomach cancer Neg Hx   . Rectal cancer Neg Hx     Past Surgical History:  Procedure Laterality Date  . AMPUTATION  03/06/2012   Procedure: AMPUTATION FOOT;  Surgeon: Newt Minion, MD;  Location: [facility];  Service: Orthopedics;  Laterality: Left;  FIFTH RAY AMPUTATION   . AMPUTATION Right 02/18/2016   Procedure: AMPUTATION BELOW KNEE;  Surgeon: Newt Minion, MD;  Location: [facility];  Service: Orthopedics;  Laterality: Right;  . AMPUTATION Left 11/22/2016   Procedure: Amputation 4th Toe Left Foot at Metatarsophalangeal Joint;  Surgeon: Newt Minion, MD;  Location: [facility];  Service: Orthopedics;  Laterality: Left;  . ANKLE FUSION  09/27/2012   Procedure: ANKLE FUSION;  Surgeon: Newt Minion, MD;  Location: [facility];  Service: Orthopedics;  Laterality: Left;  Left Tibiocalcaneal Fusion  . ANKLE FUSION Right 05/09/2013   Procedure: ANKLE FUSION;  Surgeon: Newt Minion, MD;  Location: [facility];  Service: Orthopedics;  Laterality: Right;  Excision Osteomyelitis Base 1st MT Right Foot, Fusion Medial Column  . ANTERIOR CERVICAL DECOMP/DISCECTOMY FUSION  12/18/07   For OA,  Dr. Lorin Mercy:  fu by a PE  . CARDIAC CATHETERIZATION  05/2010    at [facility]  . CARPAL TUNNEL RELEASE Left 09/2011  . CATARACT EXTRACTION W/ INTRAOCULAR LENS  IMPLANT, BILATERAL  2004   feb 2004 left, aug 2004 right  . CHOLECYSTECTOMY  03/2002  . CORONARY ANGIOPLASTY  10/31/2013  . CORONARY ARTERY BYPASS GRAFT  09/27/2016  . CYST REMOVAL HAND  06/2003  . FOOT BONE EXCISION Right 06/2009  . LAPAROSCOPIC RIGHT HEMI COLECTOMY N/A 01/08/2015   Procedure: LAPAROSCOPIC ASSISTED RIGHT HEMI COLECTOMY;  Surgeon: Johnathan Hausen, MD;  Location: [facility];  Service: General;  Laterality: N/A;  . LEFT HEART CATHETERIZATION WITH CORONARY ANGIOGRAM N/A 10/31/2013   Procedure: LEFT HEART CATHETERIZATION WITH CORONARY ANGIOGRAM;  Surgeon: Jettie Booze, MD;  Location[facility] C[facility] LAB;  Service:  Cardiovascular;  Laterality: N/A;  .  NASAL SEPTUM SURGERY  09/1963  . NISSEN FUNDOPLICATION  05/26/7840  . PERCUTANEOUS CORONARY INTERVENTION-BALLOON ONLY  10/31/2013   Procedure: PERCUTANEOUS CORONARY INTERVENTION-BALLOON ONLY;  Surgeon: Jettie Booze, MD;  Location: [facility] CATH LAB;  Service: Cardiovascular;;  . ROTATOR CUFF REPAIR Right 07/2007   Dr. Percell Miller  . ROTATOR CUFF REPAIR Left 11/2004  . stent in left leg, behind knee  06/27/2017   x2, done at [facility]ology in [facility]  . TOE AMPUTATION Right 08/2008   3rd toe, Dr. Sharol Given due to osteomyelitis  . VAGINAL HYSTERECTOMY  03/1975  . VEIN LIGATION Bilateral 03/1966  . VENA CAVA FILTER PLACEMENT  01/2010   green filter; "due to blood clots"  . WISDOM TOOTH EXTRACTION  06/2007   Social History   Occupational History  . Occupation: Retired     Comment: from Orthoptist   Tobacco Use  . Smoking status: Never Smoker  . Smokeless tobacco: Never Used  . Tobacco comment: tried for a few months in college.   Substance and Sexual Activity  . Alcohol use: No    Alcohol/week: 0.0 standard drinks  . Drug use: No  . Sexual activity: Not Currently    Birth control/protection: Post-menopausal

## 2018-12-17 ENCOUNTER — Telehealth (INDEPENDENT_AMBULATORY_CARE_PROVIDER_SITE_OTHER): Payer: Self-pay

## 2018-12-17 NOTE — Telephone Encounter (Signed)
I called to confirm that [facility] received  patient forms via faxed.

## 2018-12-19 DIAGNOSIS — G8921 Chronic pain due to trauma: Secondary | ICD-10-CM | POA: Diagnosis not present

## 2018-12-19 DIAGNOSIS — G894 Chronic pain syndrome: Secondary | ICD-10-CM | POA: Diagnosis not present

## 2018-12-19 DIAGNOSIS — M25562 Pain in left knee: Secondary | ICD-10-CM | POA: Diagnosis not present

## 2018-12-19 DIAGNOSIS — R2689 Other abnormalities of gait and mobility: Secondary | ICD-10-CM | POA: Diagnosis not present

## 2018-12-19 DIAGNOSIS — G8929 Other chronic pain: Secondary | ICD-10-CM | POA: Diagnosis not present

## 2018-12-19 DIAGNOSIS — M545 Low back pain: Secondary | ICD-10-CM | POA: Diagnosis not present

## 2018-12-19 DIAGNOSIS — M6281 Muscle weakness (generalized): Secondary | ICD-10-CM | POA: Diagnosis not present

## 2018-12-19 DIAGNOSIS — M25512 Pain in left shoulder: Secondary | ICD-10-CM | POA: Diagnosis not present

## 2018-12-20 ENCOUNTER — Ambulatory Visit: Payer: Medicare Other | Admitting: Internal Medicine

## 2018-12-20 DIAGNOSIS — M6281 Muscle weakness (generalized): Secondary | ICD-10-CM | POA: Diagnosis not present

## 2018-12-20 DIAGNOSIS — G8921 Chronic pain due to trauma: Secondary | ICD-10-CM | POA: Diagnosis not present

## 2018-12-20 DIAGNOSIS — G894 Chronic pain syndrome: Secondary | ICD-10-CM | POA: Diagnosis not present

## 2018-12-20 DIAGNOSIS — M25512 Pain in left shoulder: Secondary | ICD-10-CM | POA: Diagnosis not present

## 2018-12-20 DIAGNOSIS — M545 Low back pain: Secondary | ICD-10-CM | POA: Diagnosis not present

## 2018-12-20 DIAGNOSIS — R2689 Other abnormalities of gait and mobility: Secondary | ICD-10-CM | POA: Diagnosis not present

## 2018-12-20 DIAGNOSIS — G8929 Other chronic pain: Secondary | ICD-10-CM | POA: Diagnosis not present

## 2018-12-20 DIAGNOSIS — M25562 Pain in left knee: Secondary | ICD-10-CM | POA: Diagnosis not present

## 2018-12-23 ENCOUNTER — Telehealth: Payer: Self-pay | Admitting: Internal Medicine

## 2018-12-23 MED ORDER — HYDROCODONE-ACETAMINOPHEN 5-325 MG PO TABS: 1.0000 | ORAL_TABLET | Freq: Four times a day (QID) | ORAL | 0 refills | Status: DC | PRN

## 2018-12-23 NOTE — Telephone Encounter (Signed)
Copied from [place] [phone]. Topic: Quick Communication - Rx Refill/Question >> Dec 23, 2018  3:02 PM Selinda Flavin B, NT wrote: Medication: HYDROcodone-acetaminophen (NORCO/VICODIN) 5-325 MG tablet   Has the patient contacted their pharmacy? Yes.  To call office. (Agent: If no, request that the patient contact the pharmacy for the refill.) (Agent: If yes, when and what did the pharmacy advise?)  Preferred Pharmacy (with phone number or street name): [facility] [place], [place] - [address]  Agent: Please be advised that RX refills may take up to 3 business days. We ask that you follow-up with your pharmacy.

## 2018-12-23 NOTE — Telephone Encounter (Signed)
Pt is requesting refill on hydrocodone.   Last OV: 08/21/2018 Last Fill: 11/13/2018 #120 and 0RF UDS: 01/18/2018 Low risk

## 2018-12-23 NOTE — Telephone Encounter (Signed)
Sent!

## 2018-12-24 DIAGNOSIS — M6281 Muscle weakness (generalized): Secondary | ICD-10-CM | POA: Diagnosis not present

## 2018-12-24 DIAGNOSIS — G8921 Chronic pain due to trauma: Secondary | ICD-10-CM | POA: Diagnosis not present

## 2018-12-24 DIAGNOSIS — M25512 Pain in left shoulder: Secondary | ICD-10-CM | POA: Diagnosis not present

## 2018-12-24 DIAGNOSIS — M545 Low back pain: Secondary | ICD-10-CM | POA: Diagnosis not present

## 2018-12-24 DIAGNOSIS — M25562 Pain in left knee: Secondary | ICD-10-CM | POA: Diagnosis not present

## 2018-12-24 DIAGNOSIS — G894 Chronic pain syndrome: Secondary | ICD-10-CM | POA: Diagnosis not present

## 2018-12-24 DIAGNOSIS — R2689 Other abnormalities of gait and mobility: Secondary | ICD-10-CM | POA: Diagnosis not present

## 2018-12-24 DIAGNOSIS — G8929 Other chronic pain: Secondary | ICD-10-CM | POA: Diagnosis not present

## 2018-12-26 DIAGNOSIS — G8921 Chronic pain due to trauma: Secondary | ICD-10-CM | POA: Diagnosis not present

## 2018-12-26 DIAGNOSIS — M25512 Pain in left shoulder: Secondary | ICD-10-CM | POA: Diagnosis not present

## 2018-12-26 DIAGNOSIS — G894 Chronic pain syndrome: Secondary | ICD-10-CM | POA: Diagnosis not present

## 2018-12-26 DIAGNOSIS — M545 Low back pain: Secondary | ICD-10-CM | POA: Diagnosis not present

## 2018-12-26 DIAGNOSIS — G8929 Other chronic pain: Secondary | ICD-10-CM | POA: Diagnosis not present

## 2018-12-26 DIAGNOSIS — M25562 Pain in left knee: Secondary | ICD-10-CM | POA: Diagnosis not present

## 2018-12-26 DIAGNOSIS — M6281 Muscle weakness (generalized): Secondary | ICD-10-CM | POA: Diagnosis not present

## 2018-12-26 DIAGNOSIS — R2689 Other abnormalities of gait and mobility: Secondary | ICD-10-CM | POA: Diagnosis not present

## 2018-12-27 DIAGNOSIS — I25728 Atherosclerosis of autologous artery coronary artery bypass graft(s) with other forms of angina pectoris: Secondary | ICD-10-CM | POA: Diagnosis not present

## 2018-12-27 DIAGNOSIS — I10 Essential (primary) hypertension: Secondary | ICD-10-CM | POA: Diagnosis not present

## 2018-12-27 DIAGNOSIS — E782 Mixed hyperlipidemia: Secondary | ICD-10-CM | POA: Diagnosis not present

## 2018-12-27 DIAGNOSIS — E119 Type 2 diabetes mellitus without complications: Secondary | ICD-10-CM | POA: Diagnosis not present

## 2018-12-27 NOTE — Progress Notes (Signed)
PATIENT: [name] DOB: 09-Dec-1934  REASON FOR VISIT: follow up HISTORY FROM: patient  Chief Complaint  Patient presents with  . Follow-up    Yearly follow up. Grandson present. New room. Patient mentioned that she has been having some really bad migraines here recently.      HISTORY OF PRESENT ILLNESS: Today 01/02/19 [name] is a 84 y.o. female here today for follow up for dizziness. She has not noticed any dizziness in the past year. She reports that dizziness "just went away." She is having more headaches than usual. She reports a history of migraines. She states that these headaches feel the same. She noticed about a month ago they returned. She is having right sided pounding. She does have nausea with headaches. She is not sensitive to light or sounds. She has been taking Tylenol 1015m at least once daily. She is taking amitriptyline nightly for sleep. She is also having trouble with restless legs. She just started Mirapex 0.249mat night. She is allergic to gabapentin, Cymbalta and Lyrica (causes leg swelling). She is anticoagulated with Xarelto post MI. She has extensive PVD and CAD.   HISTORY: (copied from Dr AhCathren Laineote on 11/13/2017) Interval history 11/13/2016:  She returns for dizziness likely BPPV. I have recommended vestibular therapy but she has not gone. She has had open heart surgery since we last saw her. She is living with her son. She was feeling chest pain [place] some headache pain on the right occipital area and into the neck on the right into the muscular area which is new and maybe she slept on it wrong. Otherwise headaches resolved. She says she has not had any dizziness since last appointment. Dr. CrErnesto RutherfordENT, evaluated her which was normal but did say likely BPPV. she takes meclizine and zofran when she gets into  he car but she is much improved. She has been shooting pain into her legs every once  in a while which is not significant.     HISTORY OF PRESENT ILLNESS: [name] a [age]ear old female with a history of dizziness. She returns today for follow-up. She states that her dizziness has improved. She did have an appointment with Dr. CrClaria Dicend reports that there exam was unremarkable. She states that she takes the Zofran and meclizine before getting in the car. She states that [place] episode of [place]iness. She states approximately 2 weeks ago she felt as if the dizziness will start but then it resolved. The patient did have a below the knee amputation on the right in April. She is in the process of getting a prosthesis and learning to walk. [place] ambulate independently. She returns today for an evaluation.  Addendum; Saw Dr. CrErnesto Rutherfordno otologic issues, likely BPPV. 03/2016  03/15/16 (ahern): She is having episodes of getting very sick. She starts having motion sickness. Lots of nausea. She has vertigo. Feels like her head is spinning. Worse when she moves her head. She gets the vertigo when she moves her head. She feels like she has to sit very still and not move her head or the vertigo will come back. This is not something new. It has become more severe. These episodes have been going on for at least a year and she also had in the past. It happens  in the afternoons. Sometimes after she eats. She has decreased hearing. Has not seen anyone fore   HPI:[name] is a 84 y.o. female here as a referral from Dr. Larose Kells for headaches., PMHx spinal stenosis, HTN, neuropathy, CAD, DM2, arthritis, RSD, fatty liver. Patient fell on her steps while helping with groceries and she was flat on her back. She uses a walker and wasn't using it, helping lift groceries. Her head hit the bricks. She did not go to the hospital. This happened mid December. She followed up on her  back and neck and all was good. She hasn't felt right since then, a few weeks ago she was in the bathroom and all of a sudden she saw lights and stars blinking on the right side, went on for 30 minutes. It has happened a few times, happened less since them. She saw Dr. Bing Plume and they could not see her so she went to Dr. Katy Fitch. She has a history of migraines when she was a teenager and lasted into her thirties. Sister with very bad migraines. After the lights stop she gets a headache. No double vision, no blurry vision, but she does see visual abnormalities like something running across her vision, she is having more headaches. She is also having dizziness. She has been more irritable since hitting her head, worsening memory problems. She is having blurry vision. She takes hydrocodone daily. She takes advil sometimes not too often. They don't last that long, not that severe 5/10 for 20 minutes.   Reviewed notes, labs and imaging from outside physicians, which showed; She was seen by Dr Katy Fitch for an emergency visit. Her head had been hurting since a fall. She had a "light show" in her right eye with some vision changes. [facility] was od 20/30, os 20/25, external exam normal, slit lamp nml, fundus nml, glaucoma borderline. He suspected migraine aura. Visual fields were performed. .  crp 4.2, esr 2.   REVIEW OF SYSTEMS: Out of a complete 14 system review of symptoms, the patient complains only of the following symptoms, hearing loss, eye itching, rectal bleeding, constipation, diarrhea, rectal pain, restless legs, painful urination, joint pain, back pain, muscle cramps, rash and all other reviewed systems are negative.  ALLERGIES: Allergies  Allergen Reactions  . Cymbalta [Duloxetine Hcl] Swelling    Swelling in legs  . Gabapentin Swelling    Swelling in legs Swelling in legs  . Silicone Hives, Itching, Dermatitis and Rash  . Metformin And Related Other (See Comments)    dizzy, tired, chills, diarrhea, and  nausea  . Adhesive [Tape] Rash    rash rash  . Cefazolin Hives    Hives Hives   . Ciprofloxacin Other (See Comments)    Other reaction(s): Other (See Comments) Per pt, caused body aches Per pt, caused body aches   . Clorazepate Dipotassium Other (See Comments)    Unknown reaction  . Dilaudid [Hydromorphone Hcl] Other (See Comments)     confused, intense itching  . Doxycycline Rash  . Enablex [Darifenacin Hydrobromide Er] Other (See Comments)    Hypotension, near syncope  . Levofloxacin Other (See Comments)    Causes wrist pain  . Lyrica [Pregabalin] Swelling    Swelling in legs  . Methadone Hcl Other (See Comments)    Reaction unknown  . Morphine And Related Other (See Comments)    Confusion, constipation.   Loma Messing [Pentazocine] Other (See Comments)    "climbing walls" anxiety    HOME MEDICATIONS: Outpatient Medications  Prior to Visit  Medication Sig Dispense Refill  . Alpha-Lipoic Acid 600 MG CAPS Take 600 mg by mouth 2 (two) times daily.    Marland Kitchen atorvastatin (LIPITOR) 40 MG tablet Take 40 mg by mouth daily.    . carvedilol (COREG) 25 MG tablet Take 25 mg by mouth 2 (two) times daily with a meal.    . Cholecalciferol (VITAMIN D3) 5000 UNITS CAPS Take 5,000 Units by mouth every evening. Reported on 11/24/2015    . clopidogrel (PLAVIX) 75 MG tablet Take 75 mg by mouth at bedtime.     . Cyanocobalamin (VITAMIN B-12) 500 MCG SUBL Place 500 mcg under the tongue daily.    . diclofenac sodium (VOLTAREN) 1 % GEL APPLY 2-4 GRAMS TO AFFECTED AREA 3 TIMES DAILY AS NEEDED 500 g 5  . ferrous sulfate 325 (65 FE) MG tablet Take 1 tablet (325 mg total) by mouth 2 (two) times daily before a meal. 60 tablet 6  . FREESTYLE LITE test strip USE TO CHECK BLOOD SUGAR NO MORE THAN TWICE DAILY 200 each 12  . furosemide (LASIX) 20 MG tablet Take 20 mg by mouth daily.    Marland Kitchen HYDROcodone-acetaminophen (NORCO/VICODIN) 5-325 MG tablet Take 1 tablet by mouth every 6 (six) hours as needed for moderate  pain. 120 tablet 0  . hydrocortisone (ANUSOL-HC) 2.5 % rectal cream Place 1 application rectally 2 (two) times daily. 30 g 0  . hydrocortisone (ANUSOL-HC) 25 MG suppository Place 1 suppository (25 mg total) rectally every 12 (twelve) hours. 30 suppository 1  . Lancets (FREESTYLE) lancets CHECK BLOOD SUGAR TWICE A DAY 200 each 12  . lidocaine (LIDODERM) 5 % Place 1 patch onto the skin daily. Remove & Discard patch within 12 hours or as directed by MD 30 patch 3  . meclizine (ANTIVERT) 25 MG tablet Take 1 tablet (25 mg total) by mouth 2 (two) times daily as needed for dizziness. 180 tablet 1  . methenamine (HIPREX) 1 g tablet Take 1 tablet by mouth 2 (two) times daily.  4  . Multiple Vitamins-Minerals (PRESERVISION AREDS 2) CAPS Take 1 capsule by mouth 2 (two) times daily.    . nitroGLYCERIN (NITROSTAT) 0.4 MG SL tablet Place 0.4 mg under the tongue every 5 (five) minutes as needed for chest pain (x 3 doses). Reported on 04/14/2016    . omeprazole (PRILOSEC) 20 MG capsule Take 1 capsule (20 mg total) by mouth 2 (two) times daily. 180 capsule 3  . OVER THE COUNTER MEDICATION Take 1 capsule by mouth 2 (two) times daily. Integrative Digestive Formula    . OVER THE COUNTER MEDICATION 1 tablet 3 (three) times daily. Berberorine Gluco Defense     . Polyvinyl Alcohol-Povidone (REFRESH OP) Place 1 drop into both eyes 3 (three) times daily as needed (dry eyes).     . pramipexole (MIRAPEX) 0.125 MG tablet Take 1-2 tablets (0.125-0.25 mg total) by mouth at bedtime as needed. 180 tablet 1  . rivaroxaban (XARELTO) 2.5 MG TABS tablet Take 2.5 mg by mouth 2 (two) times daily.    Marland Kitchen amitriptyline (ELAVIL) 10 MG tablet TAKE 2 TABLETS(20 MG) BY MOUTH AT BEDTIME 180 tablet 2  . ondansetron (ZOFRAN-ODT) 4 MG disintegrating tablet TAKE 1 TABLET BY MOUTH EVERY 8 HOURS AS NEEDED FOR NAUSEA OR VOMITING 60 tablet 0   No facility-administered medications prior to visit.     PAST MEDICAL HISTORY: Past Medical History:    Diagnosis Date  . Abnormal echocardiogram    09/2013: mild LVH,  mild focal basal hypertrophy of septum, EF 60-65%, normal WM, grade 1 diastolic dysfunction, MAC, mild LAE, ASA, PASP 34. Possible oscillating MV density - reviewed by MD and felt there was no significant abnormality other than MAC noted with mitral valve and did not require TEE.  . Adenomatous colon polyp   . Allergic rhinitis   . Arthritis    "back; ankles; hands; knees" (10/31/2013)  . Bilateral sensorineural hearing loss   . CAD (coronary artery disease)    a. Nonobst in 2011. b. Abnormal nuc 09/2013 -> s/p cutting balloon to D2, mild LAD disease; c. 08/2015 MV: no ischemia, EF 71%.  . Cancer ([place])    cancerous polyps  . Carcinoma in situ in a polyp 1994   a. 1994 - malignant polyp removed during colonoscopy.  . Charcot's Foot    a.  01/2016 s/p RLE transtibial amputation 2/2 Charcot rocker-bottom deformity and insensate neuropathy ulceration.  . Chronic diastolic CHF (congestive heart failure) ([place])    a. 09/2013 Echo: EF 60-65%, mild LVH, Gr1 DD.  Marland Kitchen Diverticulosis   . DJD (degenerative joint disease)   . DVT (deep venous thrombosis) ([place]) 12/2007  . Dyspnea    a. Chronic, extensive w/u see OV note 09-2010. b. [place] 2011: ormal with RA 5 RV 31/2 PA 27/12 (19) PCW 10 CO normal. No evidence of shunting with sitting up in cath lab. c. CPX 2011: see report. d. Prior fluoro of diaphragm -  R diaphragm elevated at rest but both moved with inspiration.   . Fatty liver   . GERD (gastroesophageal reflux disease)    a. Hx GERD/esophageal dysmotility followed by Dr. Olevia Perches.   Marland Kitchen Head injury due to trauma   . Headache    Optic migraine  . Hemorrhoids   . Hiatal hernia    a. s/p Nissen fundoplication 2500.  Marland Kitchen Hyperlipidemia    a. patient unwilling to use statins.  . Hypertensive heart disease   . IBS (irritable bowel syndrome)   . Macular degeneration 03/2009   Dr. Rosana Hoes  . Myocardial infarction ([place]) 2017  . Neuropathy    a.  Hands, feet, legs.  . Orthostatic hypotension   . Osteomyelitis ([place])    a. 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 01/2016 s/p RLE transtibial amputation 2/2 Charcot rocker-bottom deformity and insensate neuropathy ulceration.  . Osteoporosis   . PE (pulmonary embolism) 12/2007   a. PE/DVT after neck surgery 2009. b. coumadin d/c 10-2008.  Marland Kitchen Pneumonia 2015ish  . PONV (postoperative nausea and vomiting) 2009   neck surgery  . PPD positive   . Pulmonary nodule    incidental per CT:  Pet scan 4-9: likely benign, CT 08-2009 no change, no further CTs (Dr. Gwenette Greet)  . Recurrent UTI   . RSD (reflex sympathetic dystrophy)    a. Chronic pain.  Marland Kitchen Spinal stenosis   . Type II diabetes mellitus ([facility])    no on medication  . Venous insufficiency    a. Contributing to LEE.    PAST SURGICAL HISTORY: Past Surgical History:  Procedure Laterality Date  . AMPUTATION  03/06/2012   Procedure: AMPUTATION FOOT;  Surgeon: Newt Minion, MD;  Location: [facility];  Service: Orthopedics;  Laterality: Left;  FIFTH RAY AMPUTATION   . AMPUTATION Right 02/18/2016   Procedure: AMPUTATION BELOW KNEE;  Surgeon: Newt Minion, MD;  Location: [facility];  Service: Orthopedics;  Laterality: Right;  . AMPUTATION Left 11/22/2016  Procedure: Amputation 4th Toe Left Foot at Metatarsophalangeal Joint;  Surgeon: Newt Minion, MD;  Location: [facility];  Service: Orthopedics;  Laterality: Left;  . ANKLE FUSION  09/27/2012   Procedure: ANKLE FUSION;  Surgeon: Newt Minion, MD;  Location: [facility];  Service: Orthopedics;  Laterality: Left;  Left Tibiocalcaneal Fusion  . ANKLE FUSION Right 05/09/2013   Procedure: ANKLE FUSION;  Surgeon: Newt Minion, MD;  Location: [facility];  Service: Orthopedics;  Laterality: Right;  Excision Osteomyelitis Base 1st MT Right Foot, Fusion Medial Column  . ANTERIOR CERVICAL DECOMP/DISCECTOMY FUSION  12/18/07   For OA,  Dr. Lorin Mercy:  fu by a PE  . CARDIAC  CATHETERIZATION  05/2010    at [facility]  . CARPAL TUNNEL RELEASE Left 09/2011  . CATARACT EXTRACTION W/ INTRAOCULAR LENS  IMPLANT, BILATERAL  2004   feb 2004 left, aug 2004 right  . CHOLECYSTECTOMY  03/2002  . CORONARY ANGIOPLASTY  10/31/2013  . CORONARY ARTERY BYPASS GRAFT  09/27/2016  . CYST REMOVAL HAND  06/2003  . FOOT BONE EXCISION Right 06/2009  . LAPAROSCOPIC RIGHT HEMI COLECTOMY N/A 01/08/2015   Procedure: LAPAROSCOPIC ASSISTED RIGHT HEMI COLECTOMY;  Surgeon: Johnathan Hausen, MD;  Location: [facility];  Service: General;  Laterality: N/A;  . LEFT HEART CATHETERIZATION WITH CORONARY ANGIOGRAM N/A 10/31/2013   Procedure: LEFT HEART CATHETERIZATION WITH CORONARY ANGIOGRAM;  Surgeon: Jettie Booze, MD;  Location[facility] CATH LAB;  Service: Cardiovascular;  Laterality: N/A;  . NASAL SEPTUM SURGERY  09/1963  . NISSEN FUNDOPLICATION  12/02/5619  . PERCUTANEOUS CORONARY INTERVENTION-BALLOON ONLY  10/31/2013   Procedure: PERCUTANEOUS CORONARY INTERVENTION-BALLOON ONLY;  Surgeon: Jettie Booze, MD;  Location: [facility] CATH LAB;  Service: Cardiovascular;;  . ROTATOR CUFF REPAIR Right 07/2007   Dr. Percell Miller  . ROTATOR CUFF REPAIR Left 11/2004  . stent in left leg, behind knee  06/27/2017   x2, done at [facility] cardiology in [facility]  . TOE AMPUTATION Right 08/2008   3rd toe, Dr. Sharol Given due to osteomyelitis  . VAGINAL HYSTERECTOMY  03/1975  . VEIN LIGATION Bilateral 03/1966  . VENA CAVA FILTER PLACEMENT  01/2010   green filter; "due to blood clots"  . WISDOM TOOTH EXTRACTION  06/2007    FAMILY HISTORY: Family History  Problem Relation Age of Onset  . Heart disease Mother        mitral valve replaced  . Arthritis Mother   . Diabetic kidney disease Daughter   . Diabetes Father   . Stroke Father   . Migraines Sister   . Hypertension Unknown   . Breast cancer Unknown   . Headache Sister   . Colon cancer Neg Hx   . Esophageal cancer Neg Hx   . Stomach cancer Neg Hx   . Rectal cancer Neg Hx      SOCIAL HISTORY: Social History   Socioeconomic History  . Marital status: Divorced    Spouse name: Not on file  . Number of children: 3  . Years of education: 74  . Highest education level: Not on file  Occupational History  . Occupation: Retired     Comment: from Orth[employer]   Social Needs  . Financial resource strain: Not on file  . Food insecurity:    Worry: Not on file    Inability: Not on file  . Transportation needs:    Medical: Not on file    Non-medical: Not on file  Tobacco Use  . Smoking status: Never  Smoker  . Smokeless tobacco: Never Used  . Tobacco comment: tried for a few months in college.   Substance and Sexual Activity  . Alcohol use: No    Alcohol/week: 0.0 standard drinks  . Drug use: No  . Sexual activity: Not Currently    Birth control/protection: Post-menopausal  Lifestyle  . Physical activity:    Days per week: Not on file    Minutes per session: Not on file  . Stress: Not on file  Relationships  . Social connections:    Talks on phone: Not on file    Gets together: Not on file    Attends religious service: Not on file    Active member of club or organization: Not on file    Attends meetings of clubs or organizations: Not on file    Relationship status: Not on file  . Intimate partner violence:    Fear of current or ex partner: Not on file    Emotionally abused: Not on file    Physically abused: Not on file    Forced sexual activity: Not on file  Other Topics Concern  . Not on file  Social History Narrative   Daughter lives w/ her and another daughter lives in town   1 son in [place]   Son comes home every 4 weeks to visit   Caffeine use:  Tea 1/day w/ dinner   Coffee occass             PHYSICAL EXAM  Vitals:   01/02/19 1448  BP: 123/72  Pulse: 84  Height: 5' 4"  (1.626 m)   Body mass index is 33.64 kg/m.  Generalized: Well developed, in no acute distress  Cardiology: normal rate and rhythm, no murmur  noted Neurological examination  Mentation: Alert oriented to time, place, history taking. Follows all commands speech and language fluent Cranial nerve II-XII: Pupils were equal round reactive to light. Extraocular movements were full, visual field were full on confrontational test. Facial sensation and strength were normal. Uvula tongue midline. Head turning and shoulder shrug  were normal and symmetric. Motor: The motor testing reveals 5 over 5 strength of all 4 extremities. Good symmetric motor tone is noted throughout.  Sensory: Sensory testing is intact to soft touch on all 4 extremities. No evidence of extinction is noted.  Coordination: Cerebellar testing reveals good finger-nose-finger and heel-to-shin bilaterally.  Gait and station: Gait is normal. Tandem gait is normal. Romberg is negative. No drift is seen.  Reflexes: Deep tendon reflexes are symmetric and normal bilaterally.   DIAGNOSTIC DATA (LABS, IMAGING, TESTING) - I reviewed patient records, labs, notes, testing and imaging myself where available.  MMSE - Mini Mental State Exam 12/14/2016  Orientation to time 5  Orientation to Place 5  Registration 3  Attention/ Calculation 5  Recall 2  Language- name 2 objects 2  Language- repeat 1  Language- follow 3 step command 3  Language- read & follow direction 1  Write a sentence 1  Copy design 1  Total score 29     Lab Results  Component Value Date   WBC 4.2 08/21/2018   HGB 12.5 08/21/2018   HCT 38.1 08/21/2018   MCV 86.5 08/21/2018   PLT 186.0 08/21/2018      Component Value Date/Time   NA 139 08/21/2018 1403   K 4.6 08/21/2018 1403   CL 107 08/21/2018 1403   CO2 22 08/21/2018 1403   GLUCOSE 137 (H) 08/21/2018 1403   BUN 19 08/21/2018  1403   CREATININE 1.11 08/21/2018 1403   CREATININE 1.02 (H) 09/20/2016 1436   CALCIUM 8.4 08/21/2018 1403   PROT 6.6 01/18/2018 1143   ALBUMIN 3.8 01/18/2018 1143   AST 11 01/18/2018 1143   ALT 5 01/18/2018 1143   ALKPHOS 93  01/18/2018 1143   BILITOT 0.4 01/18/2018 1143   GFRNONAA 43 (L) 09/26/2017 0512   GFRAA 50 (L) 09/26/2017 0512   Lab Results  Component Value Date   CHOL 112 01/18/2018   HDL 38.90 (L) 01/18/2018   LDLCALC 43 01/18/2018   LDLDIRECT 117.9 09/17/2013   TRIG 151.0 (H) 01/18/2018   CHOLHDL 3 01/18/2018   Lab Results  Component Value Date   HGBA1C 6.5 05/20/2018   Lab Results  Component Value Date   VITAMINB12 >1500 pg/mL (H) 09/10/2009   Lab Results  Component Value Date   TSH 1.44 09/20/2016       ASSESSMENT AND PLAN 84 y.o. year old female  has a past medical history of Abnormal echocardiogram, Adenomatous colon polyp, Allergic rhinitis, Arthritis, Bilateral sensorineural hearing loss, CAD (coronary artery disease), Cancer ([place]), Carcinoma in situ in a polyp (1994), Charcot's Foot, Chronic diastolic CHF (congestive heart failure) ([place]), Diverticulosis, DJD (degenerative joint disease), DVT (deep venous thrombosis) ([place]) (12/2007), Dyspnea, Fatty liver, GERD (gastroesophageal reflux disease), Head injury due to trauma, Headache, Hemorrhoids, Hiatal hernia, Hyperlipidemia, Hypertensive heart disease, IBS (irritable bowel syndrome), Macular degeneration (03/2009), Myocardial infarction ([place]) (2017), Neuropathy, Orthostatic hypotension, Osteomyelitis ([place]), Osteoporosis, PE (pulmonary embolism) (12/2007), Pneumonia (2015ish), PONV (postoperative nausea and vomiting) (2009), PPD positive, Pulmonary nodule, Recurrent UTI, RSD (reflex sympathetic dystrophy), Spinal stenosis, Type II diabetes mellitus ([place]), and Venous insufficiency. here with     ICD-10-CM   1. Migraine without aura and without status migrainosus, not intractable G43.009 amitriptyline (ELAVIL) 25 MG tablet  2. Nausea without vomiting R11.0 ondansetron (ZOFRAN-ODT) 4 MG disintegrating tablet    Unfortunately she is having more frequent headaches and migraines over the past few months. She has an extensive medical history and  allergy list. She is hesitant to start any new medications. She has tolerated amitriptyline well at 60m with no apparent adverse reactions. We will increase this to 268mat night. I have discussed side effects with her and her grandson. Both verbalize understanding. They will call with any concerns. She is not a good candidate for triptan or Nsaid therapy. UbRoselyn Meiers not covered under her insurance. We will continue Tylenol as needed but she has been advised to limit this to 2-3 times weekly. We have discussed rebound headaches. We will refill zofran for nausea. She will follow up with PCP closely for chronic disease management. Follow up with us[place]orean 6 months.    No orders of the defined types were placed in this encounter.    Meds ordered this encounter  Medications  . ondansetron (ZOFRAN-ODT) 4 MG disintegrating tablet    Sig: TAKE 1 TABLET BY MOUTH EVERY 8 HOURS AS NEEDED FOR NAUSEA OR VOMITING    Dispense:  60 tablet    Refill:  0  . amitriptyline (ELAVIL) 25 MG tablet    Sig: Take 1 tablet (25 mg total) by mouth at bedtime.    Dispense:  90 tablet    Refill:  3    Order Specific Question:   Supervising Provider    Answer:   AHMelvenia Beam1[[id number]]       NTI [id number]-C 01/02/2019, 3:25 PM [facility] [address]  [place] [address] [phone]

## 2018-12-31 ENCOUNTER — Telehealth: Payer: Self-pay | Admitting: Neurology

## 2018-12-31 NOTE — Telephone Encounter (Signed)
Pt states that she is having pain in her hands and she is wanting to know if she can get the nerves in her hands checked. Pt requested that her reporting it is documented in her chart. Please advise.

## 2018-12-31 NOTE — Telephone Encounter (Signed)
Dr. Jaynee Eagles aware of call and advice given.

## 2018-12-31 NOTE — Telephone Encounter (Signed)
I spoke with the patient and advised that she see her PCP first for this hand pain as it may not be neurologic. Advised if they feel she needs to be seen by neurology then we would be happy to see her but she would need a new referral for this. Pt is aware of her f/u for dizziness with Amy on 01/02/2019. She verbalized understanding and appreciation for the call.

## 2019-01-02 ENCOUNTER — Other Ambulatory Visit: Payer: Self-pay

## 2019-01-02 ENCOUNTER — Ambulatory Visit (INDEPENDENT_AMBULATORY_CARE_PROVIDER_SITE_OTHER): Payer: Medicare Other | Admitting: Family Medicine

## 2019-01-02 ENCOUNTER — Encounter: Payer: Self-pay | Admitting: Family Medicine

## 2019-01-02 VITALS — BP 123/72 | HR 84 | Ht 64.0 in

## 2019-01-02 DIAGNOSIS — G43009 Migraine without aura, not intractable, without status migrainosus: Secondary | ICD-10-CM | POA: Diagnosis not present

## 2019-01-02 DIAGNOSIS — R110 Nausea: Secondary | ICD-10-CM

## 2019-01-02 MED ORDER — AMITRIPTYLINE HCL 25 MG PO TABS: 25.0000 mg | ORAL_TABLET | Freq: Every day | ORAL | 3 refills | Status: DC

## 2019-01-02 MED ORDER — ONDANSETRON 4 MG PO TBDP: ORAL_TABLET | ORAL | 0 refills | Status: DC

## 2019-01-02 NOTE — Patient Instructions (Addendum)
We will increase amitriptyline to 25 mg daily, please monitor closely for any of the side effects as discussed in the office and listed below.    Amitriptyline tablets What is this medicine? AMITRIPTYLINE (a mee TRIP ti leen) is used to treat depression. This medicine may be used for other purposes; ask your health care provider or pharmacist if you have questions. COMMON BRAND NAME(S): Elavil, Vanatrip What should I tell my health care provider before I take this medicine? They need to know if you have any of these conditions: -an alcohol problem -asthma, difficulty breathing -bipolar disorder or schizophrenia -difficulty passing urine, prostate trouble -glaucoma -heart disease or previous heart attack -liver disease -over active thyroid -seizures -thoughts or plans of suicide, a previous suicide attempt, or family history of suicide attempt -an unusual or allergic reaction to amitriptyline, other medicines, foods, dyes, or preservatives -pregnant or trying to get pregnant -breast-feeding How should I use this medicine? Take this medicine by mouth with a drink of water. Follow the directions on the prescription label. You can take the tablets with or without food. Take your medicine at regular intervals. Do not take it more often than directed. Do not stop taking this medicine suddenly except upon the advice of your doctor. Stopping this medicine too quickly may cause serious side effects or your condition may worsen. A special MedGuide will be given to you by the pharmacist with each prescription and refill. Be sure to read this information carefully each time. Talk to your pediatrician regarding the use of this medicine in children. Special care may be needed. Overdosage: If you think you have taken too much of this medicine contact a poison control center or emergency room at once. NOTE: This medicine is only for you. Do not share this medicine with others. What if I miss a  dose? If you miss a dose, take it as soon as you can. If it is almost time for your next dose, take only that dose. Do not take double or extra doses. What may interact with this medicine? Do not take this medicine with any of the following medications: -arsenic trioxide -certain medicines used to regulate abnormal heartbeat or to treat other heart conditions -cisapride -droperidol -halofantrine -linezolid -MAOIs like Carbex, Eldepryl, Marplan, Nardil, and Parnate -methylene blue -other medicines for mental depression -phenothiazines like perphenazine, thioridazine and chlorpromazine -pimozide -probucol -procarbazine -sparfloxacin -St. John's Wort -ziprasidone This medicine may also interact with the following medications: -atropine and related drugs like hyoscyamine, scopolamine, tolterodine and others -barbiturate medicines for inducing sleep or treating seizures, like phenobarbital -cimetidine -disulfiram -ethchlorvynol -thyroid hormones such as levothyroxine This list may not describe all possible interactions. Give your health care provider a list of all the medicines, herbs, non-prescription drugs, or dietary supplements you use. Also tell them if you smoke, drink alcohol, or use illegal drugs. Some items may interact with your medicine. What should I watch for while using this medicine? Tell your doctor if your symptoms do not get better or if they get worse. Visit your doctor or health care professional for regular checks on your progress. Because it may take several weeks to see the full effects of this medicine, it is important to continue your treatment as prescribed by your doctor. Patients and their families should watch out for new or worsening thoughts of suicide or depression. Also watch out for sudden changes in feelings such as feeling anxious, agitated, panicky, irritable, hostile, aggressive, impulsive, severely restless, overly excited and hyperactive,  or not being  able to sleep. If this happens, especially at the beginning of treatment or after a change in dose, call your health care professional. Dennis Bast may get drowsy or dizzy. Do not drive, use machinery, or do anything that needs mental alertness until you know how this medicine affects you. Do not stand or sit up quickly, especially if you are an older patient. This reduces the risk of dizzy or fainting spells. Alcohol may interfere with the effect of this medicine. Avoid alcoholic drinks. Do not treat yourself for coughs, colds, or allergies without asking your doctor or health care professional for advice. Some ingredients can increase possible side effects. Your mouth may get dry. Chewing sugarless gum or sucking hard candy, and drinking plenty of water will help. Contact your doctor if the problem does not go away or is severe. This medicine may cause dry eyes and blurred vision. If you wear contact lenses you may feel some discomfort. Lubricating drops may help. See your eye doctor if the problem does not go away or is severe. This medicine can cause constipation. Try to have a bowel movement at least every 2 to 3 days. If you do not have a bowel movement for 3 days, call your doctor or health care professional. This medicine can make you more sensitive to the sun. Keep out of the sun. If you cannot avoid being in the sun, wear protective clothing and use sunscreen. Do not use sun lamps or tanning beds/booths. What side effects may I notice from receiving this medicine? Side effects that you should report to your doctor or health care professional as soon as possible: -allergic reactions like skin rash, itching or hives, swelling of the face, lips, or tongue -anxious -breathing problems -changes in vision -confusion -elevated mood, decreased need for sleep, racing thoughts, impulsive behavior -eye pain -fast, irregular heartbeat -feeling faint or lightheaded, falls -feeling agitated, angry, or  irritable -fever with increased sweating -hallucination, loss of contact with reality -seizures -stiff muscles -suicidal thoughts or other mood changes -tingling, pain, or numbness in the feet or hands -trouble passing urine or change in the amount of urine -trouble sleeping -unusually weak or tired -vomiting -yellowing of the eyes or skin Side effects that usually do not require medical attention (report to your doctor or health care professional if they continue or are bothersome): -change in sex drive or performance -change in appetite or weight -constipation -dizziness -dry mouth -nausea -tired -tremors -upset stomach This list may not describe all possible side effects. Call your doctor for medical advice about side effects. You may report side effects to FDA at 1-800-FDA-1088. Where should I keep my medicine? Keep out of the reach of children. Store at room temperature between 20 and 25 degrees C (68 and 77 degrees F). Throw away any unused medicine after the expiration date. NOTE: This sheet is a summary. It may not cover all possible information. If you have questions about this medicine, talk to your doctor, pharmacist, or health care provider.  2019 Elsevier/Gold Standard (2016-03-10 12:14:15)    Analgesic Rebound Headache An analgesic rebound headache, sometimes called a medication overuse headache, is a headache that comes after pain medicine (analgesic) taken to treat the original (primary) headache has worn off. Any type of primary headache can return as a rebound headache if a person regularly takes analgesics more than three times a week to treat it. The types of primary headaches that are commonly associated with rebound headaches include:  Migraines.  Headaches that arise from tense muscles in the head and neck area (tension headaches).  Headaches that develop and happen again (recur) on one side of the head and around the eye (cluster headaches). If rebound  headaches continue, they become chronic daily headaches. What are the causes? This condition may be caused by frequent use of:  Over-the-counter medicines such as aspirin, ibuprofen, and acetaminophen.  Sinus relief medicines and other medicines that contain caffeine.  Narcotic pain medicines such as codeine and oxycodone. What are the signs or symptoms? The symptoms of a rebound headache are the same as the symptoms of the original headache. Some of the symptoms of specific types of headaches include: Migraine headache  Pulsing or throbbing pain on one or both sides of the head.  Severe pain that interferes with daily activities.  Pain that is worsened by physical activity.  Nausea, vomiting, or both.  Pain with exposure to bright light, loud noises, or strong smells.  General sensitivity to bright light, loud noises, or strong smells.  Visual changes.  Numbness of one or both arms. Tension headache  Pressure around the head.  Dull, aching head pain.  Pain felt over the front and sides of the head.  Tenderness in the muscles of the head, neck, and shoulders. Cluster headache  Severe pain that begins in or around one eye or temple.  Redness and tearing in the eye on the same side as the pain.  Droopy or swollen eyelid.  One-sided head pain.  Nausea.  Runny nose.  Sweaty, pale facial skin.  Restlessness. How is this diagnosed? This condition is diagnosed by:  Reviewing your medical history. This includes the nature of your primary headaches.  Reviewing the types of pain medicines that you have been using to treat your headaches and how often you take them. How is this treated? This condition may be treated or managed by:  Discontinuing frequent use of the analgesic medicine. Doing this may worsen your headaches at first, but the pain should eventually become more manageable, less frequent, and less severe.  Seeing a headache specialist. He or she may be  able to help you manage your headaches and help make sure there is not another cause of the headaches.  Using methods of stress relief, such as acupuncture, counseling, biofeedback, and massage. Talk with your health care provider about which methods might be good for you. Follow these instructions at home:  Take over-the-counter and prescription medicines only as told by your health care provider.  Stop the repeated use of pain medicine as told by your health care provider. Stopping can be difficult. Carefully follow instructions from your health care provider.  Avoid triggers that are known to cause your primary headaches.  Keep all follow-up visits as told by your health care provider. This is important. Contact a health care provider if:  You continue to experience headaches after following treatments that your health care provider recommended. Get help right away if:  You develop new headache pain.  You develop headache pain that is different than what you have experienced in the past.  You develop numbness or tingling in your arms or legs.  You develop changes in your speech or vision. This information is not intended to replace advice given to you by your health care provider. Make sure you discuss any questions you have with your health care provider. Document Released: 12/30/2003 Document Revised: 04/28/2016 Document Reviewed: 03/13/2016 Elsevier Interactive Patient Education  2019 [employer].   Migraine  Headache  A migraine headache is a very strong throbbing pain on one side or both sides of your head. Migraines can also cause other symptoms. Talk with your doctor about what things may bring on (trigger) your migraine headaches. Follow these instructions at home: Medicines  Take over-the-counter and prescription medicines only as told by your doctor.  Do not drive or use heavy machinery while taking prescription pain medicine.  To prevent or treat constipation  while you are taking prescription pain medicine, your doctor may recommend that you: ? Drink enough fluid to keep your pee (urine) clear or pale yellow. ? Take over-the-counter or prescription medicines. ? Eat foods that are high in fiber. These include fresh fruits and vegetables, whole grains, and beans. ? Limit foods that are high in fat and processed sugars. These include fried and sweet foods. Lifestyle  Avoid alcohol.  Do not use any products that contain nicotine or tobacco, such as cigarettes and e-cigarettes. If you need help quitting, ask your doctor.  Get at least 8 hours of sleep every night.  Limit your stress. General instructions   Keep a journal to find out what may bring on your migraines. For example, write down: ? What you eat and drink. ? How much sleep you get. ? Any change in what you eat or drink. ? Any change in your medicines.  If you have a migraine: ? Avoid things that make your symptoms worse, such as bright lights. ? It may help to lie down in a dark, quiet room. ? Do not drive or use heavy machinery. ? Ask your doctor what activities are safe for you.  Keep all follow-up visits as told by your doctor. This is important. Contact a doctor if:  You get a migraine that is different or worse than your usual migraines. Get help right away if:  Your migraine gets very bad.  You have a fever.  You have a stiff neck.  You have trouble seeing.  Your muscles feel weak or like you cannot control them.  You start to lose your balance a lot.  You start to have trouble walking.  You pass out (faint). This information is not intended to replace advice given to you by your health care provider. Make sure you discuss any questions you have with your health care provider. Document Released: 07/18/2008 Document Revised: 07/03/2018 Document Reviewed: 03/27/2016 Elsevier Interactive Patient Education  2019 [employer].

## 2019-01-03 ENCOUNTER — Ambulatory Visit: Payer: Medicare Other | Admitting: Internal Medicine

## 2019-01-03 ENCOUNTER — Ambulatory Visit: Payer: Medicare Other

## 2019-01-08 NOTE — Progress Notes (Signed)
Made any corrections needed, and agree with history, physical, neuro exam,assessment and plan as stated.     Antonia Ahern, MD [facility]  

## 2019-01-21 DIAGNOSIS — G8929 Other chronic pain: Secondary | ICD-10-CM | POA: Diagnosis not present

## 2019-01-21 DIAGNOSIS — M25512 Pain in left shoulder: Secondary | ICD-10-CM | POA: Diagnosis not present

## 2019-01-21 DIAGNOSIS — G8921 Chronic pain due to trauma: Secondary | ICD-10-CM | POA: Diagnosis not present

## 2019-01-21 DIAGNOSIS — M25562 Pain in left knee: Secondary | ICD-10-CM | POA: Diagnosis not present

## 2019-01-21 DIAGNOSIS — R2689 Other abnormalities of gait and mobility: Secondary | ICD-10-CM | POA: Diagnosis not present

## 2019-01-21 DIAGNOSIS — M545 Low back pain: Secondary | ICD-10-CM | POA: Diagnosis not present

## 2019-01-21 DIAGNOSIS — G894 Chronic pain syndrome: Secondary | ICD-10-CM | POA: Diagnosis not present

## 2019-01-21 DIAGNOSIS — M6281 Muscle weakness (generalized): Secondary | ICD-10-CM | POA: Diagnosis not present

## 2019-01-23 ENCOUNTER — Other Ambulatory Visit: Payer: Self-pay | Admitting: Internal Medicine

## 2019-01-23 DIAGNOSIS — G894 Chronic pain syndrome: Secondary | ICD-10-CM | POA: Diagnosis not present

## 2019-01-23 DIAGNOSIS — M25562 Pain in left knee: Secondary | ICD-10-CM | POA: Diagnosis not present

## 2019-01-23 DIAGNOSIS — M545 Low back pain: Secondary | ICD-10-CM | POA: Diagnosis not present

## 2019-01-23 DIAGNOSIS — G8929 Other chronic pain: Secondary | ICD-10-CM | POA: Diagnosis not present

## 2019-01-23 DIAGNOSIS — G8921 Chronic pain due to trauma: Secondary | ICD-10-CM | POA: Diagnosis not present

## 2019-01-23 DIAGNOSIS — R2689 Other abnormalities of gait and mobility: Secondary | ICD-10-CM | POA: Diagnosis not present

## 2019-01-23 DIAGNOSIS — M25512 Pain in left shoulder: Secondary | ICD-10-CM | POA: Diagnosis not present

## 2019-01-23 DIAGNOSIS — M6281 Muscle weakness (generalized): Secondary | ICD-10-CM | POA: Diagnosis not present

## 2019-01-23 NOTE — Telephone Encounter (Signed)
Can pt have a refill on this 

## 2019-01-24 MED ORDER — HYDROCODONE-ACETAMINOPHEN 5-325 MG PO TABS: 1.0000 | ORAL_TABLET | Freq: Four times a day (QID) | ORAL | 0 refills | Status: DC | PRN

## 2019-01-24 NOTE — Telephone Encounter (Signed)
Pt is requesting refill on hydrocodone.   Last OV: 08/21/2018 Last Fill: 12/23/2018 #120 and 0RF UDS: 01/18/2018 Low risk

## 2019-01-24 NOTE — Telephone Encounter (Signed)
LMOM to see which pharmacy she wanted Rx to go to. Also, she has appt w/ PCP 4/9 at 9:40am-- virtual visit?

## 2019-01-24 NOTE — Telephone Encounter (Signed)
Switch visit to virtual. Prescription sent

## 2019-01-24 NOTE — Telephone Encounter (Signed)
Pt is in [place] with her Son.   She needs it to go to [place], [place] - [address] AT [address]  [place] [address]  Phone: [phone] Fax: [phone]

## 2019-01-27 ENCOUNTER — Telehealth: Payer: Self-pay | Admitting: Internal Medicine

## 2019-01-27 NOTE — Telephone Encounter (Signed)
Rx was sent there on 01/24/2019.

## 2019-01-29 DIAGNOSIS — M545 Low back pain: Secondary | ICD-10-CM | POA: Diagnosis not present

## 2019-01-29 DIAGNOSIS — M25562 Pain in left knee: Secondary | ICD-10-CM | POA: Diagnosis not present

## 2019-01-29 DIAGNOSIS — M25512 Pain in left shoulder: Secondary | ICD-10-CM | POA: Diagnosis not present

## 2019-01-29 DIAGNOSIS — G8921 Chronic pain due to trauma: Secondary | ICD-10-CM | POA: Diagnosis not present

## 2019-01-29 DIAGNOSIS — G8929 Other chronic pain: Secondary | ICD-10-CM | POA: Diagnosis not present

## 2019-01-29 DIAGNOSIS — M6281 Muscle weakness (generalized): Secondary | ICD-10-CM | POA: Diagnosis not present

## 2019-01-29 DIAGNOSIS — R2689 Other abnormalities of gait and mobility: Secondary | ICD-10-CM | POA: Diagnosis not present

## 2019-01-29 DIAGNOSIS — G894 Chronic pain syndrome: Secondary | ICD-10-CM | POA: Diagnosis not present

## 2019-01-30 ENCOUNTER — Ambulatory Visit: Payer: Medicare Other | Admitting: Internal Medicine

## 2019-01-31 DIAGNOSIS — M25512 Pain in left shoulder: Secondary | ICD-10-CM | POA: Diagnosis not present

## 2019-01-31 DIAGNOSIS — G8929 Other chronic pain: Secondary | ICD-10-CM | POA: Diagnosis not present

## 2019-01-31 DIAGNOSIS — M545 Low back pain: Secondary | ICD-10-CM | POA: Diagnosis not present

## 2019-01-31 DIAGNOSIS — G8921 Chronic pain due to trauma: Secondary | ICD-10-CM | POA: Diagnosis not present

## 2019-01-31 DIAGNOSIS — M25562 Pain in left knee: Secondary | ICD-10-CM | POA: Diagnosis not present

## 2019-01-31 DIAGNOSIS — M6281 Muscle weakness (generalized): Secondary | ICD-10-CM | POA: Diagnosis not present

## 2019-01-31 DIAGNOSIS — G894 Chronic pain syndrome: Secondary | ICD-10-CM | POA: Diagnosis not present

## 2019-01-31 DIAGNOSIS — R2689 Other abnormalities of gait and mobility: Secondary | ICD-10-CM | POA: Diagnosis not present

## 2019-02-04 DIAGNOSIS — M6281 Muscle weakness (generalized): Secondary | ICD-10-CM | POA: Diagnosis not present

## 2019-02-04 DIAGNOSIS — M25512 Pain in left shoulder: Secondary | ICD-10-CM | POA: Diagnosis not present

## 2019-02-04 DIAGNOSIS — G894 Chronic pain syndrome: Secondary | ICD-10-CM | POA: Diagnosis not present

## 2019-02-04 DIAGNOSIS — R2689 Other abnormalities of gait and mobility: Secondary | ICD-10-CM | POA: Diagnosis not present

## 2019-02-04 DIAGNOSIS — G8921 Chronic pain due to trauma: Secondary | ICD-10-CM | POA: Diagnosis not present

## 2019-02-04 DIAGNOSIS — M545 Low back pain: Secondary | ICD-10-CM | POA: Diagnosis not present

## 2019-02-04 DIAGNOSIS — G8929 Other chronic pain: Secondary | ICD-10-CM | POA: Diagnosis not present

## 2019-02-04 DIAGNOSIS — M25562 Pain in left knee: Secondary | ICD-10-CM | POA: Diagnosis not present

## 2019-02-05 ENCOUNTER — Encounter: Payer: Self-pay | Admitting: Gastroenterology

## 2019-02-06 ENCOUNTER — Other Ambulatory Visit: Payer: Self-pay | Admitting: Internal Medicine

## 2019-02-06 DIAGNOSIS — M25562 Pain in left knee: Secondary | ICD-10-CM | POA: Diagnosis not present

## 2019-02-06 DIAGNOSIS — G8921 Chronic pain due to trauma: Secondary | ICD-10-CM | POA: Diagnosis not present

## 2019-02-06 DIAGNOSIS — M545 Low back pain: Secondary | ICD-10-CM | POA: Diagnosis not present

## 2019-02-06 DIAGNOSIS — R2689 Other abnormalities of gait and mobility: Secondary | ICD-10-CM | POA: Diagnosis not present

## 2019-02-06 DIAGNOSIS — G8929 Other chronic pain: Secondary | ICD-10-CM | POA: Diagnosis not present

## 2019-02-06 DIAGNOSIS — M25512 Pain in left shoulder: Secondary | ICD-10-CM | POA: Diagnosis not present

## 2019-02-06 DIAGNOSIS — G894 Chronic pain syndrome: Secondary | ICD-10-CM | POA: Diagnosis not present

## 2019-02-06 DIAGNOSIS — M6281 Muscle weakness (generalized): Secondary | ICD-10-CM | POA: Diagnosis not present

## 2019-02-11 DIAGNOSIS — M545 Low back pain: Secondary | ICD-10-CM | POA: Diagnosis not present

## 2019-02-11 DIAGNOSIS — G894 Chronic pain syndrome: Secondary | ICD-10-CM | POA: Diagnosis not present

## 2019-02-11 DIAGNOSIS — R2689 Other abnormalities of gait and mobility: Secondary | ICD-10-CM | POA: Diagnosis not present

## 2019-02-11 DIAGNOSIS — M6281 Muscle weakness (generalized): Secondary | ICD-10-CM | POA: Diagnosis not present

## 2019-02-11 DIAGNOSIS — G8921 Chronic pain due to trauma: Secondary | ICD-10-CM | POA: Diagnosis not present

## 2019-02-11 DIAGNOSIS — M25512 Pain in left shoulder: Secondary | ICD-10-CM | POA: Diagnosis not present

## 2019-02-11 DIAGNOSIS — M25562 Pain in left knee: Secondary | ICD-10-CM | POA: Diagnosis not present

## 2019-02-11 DIAGNOSIS — G8929 Other chronic pain: Secondary | ICD-10-CM | POA: Diagnosis not present

## 2019-02-13 DIAGNOSIS — I700 Atherosclerosis of aorta: Secondary | ICD-10-CM | POA: Diagnosis not present

## 2019-02-13 DIAGNOSIS — I70213 Atherosclerosis of native arteries of extremities with intermittent claudication, bilateral legs: Secondary | ICD-10-CM | POA: Diagnosis not present

## 2019-02-14 DIAGNOSIS — G8921 Chronic pain due to trauma: Secondary | ICD-10-CM | POA: Diagnosis not present

## 2019-02-14 DIAGNOSIS — G8929 Other chronic pain: Secondary | ICD-10-CM | POA: Diagnosis not present

## 2019-02-14 DIAGNOSIS — M6281 Muscle weakness (generalized): Secondary | ICD-10-CM | POA: Diagnosis not present

## 2019-02-14 DIAGNOSIS — M25562 Pain in left knee: Secondary | ICD-10-CM | POA: Diagnosis not present

## 2019-02-14 DIAGNOSIS — M25512 Pain in left shoulder: Secondary | ICD-10-CM | POA: Diagnosis not present

## 2019-02-14 DIAGNOSIS — G894 Chronic pain syndrome: Secondary | ICD-10-CM | POA: Diagnosis not present

## 2019-02-14 DIAGNOSIS — R2689 Other abnormalities of gait and mobility: Secondary | ICD-10-CM | POA: Diagnosis not present

## 2019-02-14 DIAGNOSIS — M545 Low back pain: Secondary | ICD-10-CM | POA: Diagnosis not present

## 2019-02-18 DIAGNOSIS — M25512 Pain in left shoulder: Secondary | ICD-10-CM | POA: Diagnosis not present

## 2019-02-18 DIAGNOSIS — G894 Chronic pain syndrome: Secondary | ICD-10-CM | POA: Diagnosis not present

## 2019-02-18 DIAGNOSIS — M25562 Pain in left knee: Secondary | ICD-10-CM | POA: Diagnosis not present

## 2019-02-18 DIAGNOSIS — M6281 Muscle weakness (generalized): Secondary | ICD-10-CM | POA: Diagnosis not present

## 2019-02-18 DIAGNOSIS — M545 Low back pain: Secondary | ICD-10-CM | POA: Diagnosis not present

## 2019-02-18 DIAGNOSIS — R2689 Other abnormalities of gait and mobility: Secondary | ICD-10-CM | POA: Diagnosis not present

## 2019-02-18 DIAGNOSIS — M25522 Pain in left elbow: Secondary | ICD-10-CM | POA: Diagnosis not present

## 2019-02-18 DIAGNOSIS — G8929 Other chronic pain: Secondary | ICD-10-CM | POA: Diagnosis not present

## 2019-02-18 DIAGNOSIS — G8921 Chronic pain due to trauma: Secondary | ICD-10-CM | POA: Diagnosis not present

## 2019-02-20 ENCOUNTER — Telehealth: Payer: Self-pay | Admitting: Internal Medicine

## 2019-02-20 NOTE — Telephone Encounter (Signed)
Copied from [place] [id number]. Topic: Quick Communication - Rx Refill/Question >> Feb 20, 2019  2:42 PM Keene Breath wrote: Medication: HYDROcodone-acetaminophen (NORCO/VICODIN) 5-325 MG tablet  Patient called to request a refill for the above medication  Preferred Pharmacy (with phone number or street name): [id number] - [place], [place] - [address] AT [place] [phone] (Phone) [phone] (Fax)

## 2019-02-21 DIAGNOSIS — M25522 Pain in left elbow: Secondary | ICD-10-CM | POA: Diagnosis not present

## 2019-02-21 DIAGNOSIS — M545 Low back pain: Secondary | ICD-10-CM | POA: Diagnosis not present

## 2019-02-21 DIAGNOSIS — G894 Chronic pain syndrome: Secondary | ICD-10-CM | POA: Diagnosis not present

## 2019-02-21 DIAGNOSIS — M6281 Muscle weakness (generalized): Secondary | ICD-10-CM | POA: Diagnosis not present

## 2019-02-21 DIAGNOSIS — I70213 Atherosclerosis of native arteries of extremities with intermittent claudication, bilateral legs: Secondary | ICD-10-CM | POA: Diagnosis not present

## 2019-02-21 DIAGNOSIS — G8929 Other chronic pain: Secondary | ICD-10-CM | POA: Diagnosis not present

## 2019-02-21 DIAGNOSIS — R2689 Other abnormalities of gait and mobility: Secondary | ICD-10-CM | POA: Diagnosis not present

## 2019-02-21 DIAGNOSIS — M25512 Pain in left shoulder: Secondary | ICD-10-CM | POA: Diagnosis not present

## 2019-02-21 DIAGNOSIS — I700 Atherosclerosis of aorta: Secondary | ICD-10-CM | POA: Diagnosis not present

## 2019-02-21 DIAGNOSIS — G8921 Chronic pain due to trauma: Secondary | ICD-10-CM | POA: Diagnosis not present

## 2019-02-21 DIAGNOSIS — M25562 Pain in left knee: Secondary | ICD-10-CM | POA: Diagnosis not present

## 2019-02-21 NOTE — Telephone Encounter (Signed)
LVM for pt to call the office and schedule VOV since pt was last seen in 07-2018. Pt is needing refills of her meds.

## 2019-02-21 NOTE — Telephone Encounter (Signed)
Last ov 07/2018- needs virtual visit for refills.

## 2019-02-24 ENCOUNTER — Other Ambulatory Visit: Payer: Self-pay

## 2019-02-24 ENCOUNTER — Ambulatory Visit (INDEPENDENT_AMBULATORY_CARE_PROVIDER_SITE_OTHER): Payer: Medicare Other | Admitting: Internal Medicine

## 2019-02-24 ENCOUNTER — Encounter: Payer: Self-pay | Admitting: Internal Medicine

## 2019-02-24 DIAGNOSIS — I2583 Coronary atherosclerosis due to lipid rich plaque: Secondary | ICD-10-CM | POA: Diagnosis not present

## 2019-02-24 DIAGNOSIS — I2510 Atherosclerotic heart disease of native coronary artery without angina pectoris: Secondary | ICD-10-CM

## 2019-02-24 DIAGNOSIS — I10 Essential (primary) hypertension: Secondary | ICD-10-CM

## 2019-02-24 DIAGNOSIS — G9050 Complex regional pain syndrome I, unspecified: Secondary | ICD-10-CM | POA: Diagnosis not present

## 2019-02-24 DIAGNOSIS — E782 Mixed hyperlipidemia: Secondary | ICD-10-CM | POA: Diagnosis not present

## 2019-02-24 DIAGNOSIS — E1142 Type 2 diabetes mellitus with diabetic polyneuropathy: Secondary | ICD-10-CM | POA: Diagnosis not present

## 2019-02-24 MED ORDER — HYDROCODONE-ACETAMINOPHEN 5-325 MG PO TABS: 1.0000 | ORAL_TABLET | Freq: Four times a day (QID) | ORAL | 0 refills | Status: DC | PRN

## 2019-02-24 NOTE — Progress Notes (Signed)
Subjective:    Patient ID: [name], female    DOB: 1934-12-14, 84 y.o.   MRN: [id number]  DOS:  02/24/2019 Type of visit - description: Virtual Visit via Video Note  I connected with@ on 02/25/19 at  1:00 PM EDT by a video enabled telemedicine application and verified that I am speaking with the correct person using two identifiers.   THIS ENCOUNTER IS A VIRTUAL VISIT DUE TO COVID-19 - PATIENT WAS NOT SEEN IN THE OFFICE. PATIENT HAS CONSENTED TO VIRTUAL VISIT / TELEMEDICINE VISIT   Location of patient: home  Location of provider: office  I discussed the limitations of evaluation and management by telemedicine and the availability of in person appointments. The patient expressed understanding and agreed to proceed.  History of Present Illness: Routine office visit Peripheral vascular disease: Recently had ultrasound on the lower extremity and was told she was doing very well. HTN: Ambulatory BPs are rarely check, the last time was 141/80 High cholesterol: On Lipitor, no apparent side effects DM: Diet controlled, CBGs in the 90s to 100s. History of UTIs: No recent problems.   Review of Systems  Denies fever chills No chest pain No nausea, vomiting, diarrhea No cough No rashes  Past Medical History:  Diagnosis Date  . Abnormal echocardiogram    09/2013: mild LVH, mild focal basal hypertrophy of septum, EF 60-65%, normal WM, grade 1 diastolic dysfunction, MAC, mild LAE, ASA, PASP 34. Possible oscillating MV density - reviewed by MD and felt there was no significant abnormality other than MAC noted with mitral valve and did not require TEE.  . Adenomatous colon polyp   . Allergic rhinitis   . Arthritis    "back; ankles; hands; knees" (10/31/2013)  . Bilateral sensorineural hearing loss   . CAD (coronary artery disease)    a. Nonobst in 2011. b. Abnormal nuc 09/2013 -> s/p cutting balloon to D2, mild LAD disease; c. 08/2015 MV: no ischemia, EF 71%.  . Cancer ([place])    cancerous polyps  . Carcinoma in situ in a polyp 1994   a. 1994 - malignant polyp removed during colonoscopy.  . Charcot's Foot    a.  01/2016 s/p RLE transtibial amputation 2/2 Charcot rocker-bottom deformity and insensate neuropathy ulceration.  . Chronic diastolic CHF (congestive heart failure) ([place])    a. 09/2013 Echo: EF 60-65%, mild LVH, Gr1 DD.  Marland Kitchen Diverticulosis   . DJD (degenerative joint disease)   . DVT (deep venous thrombosis) ([place]) 12/2007  . Dyspnea    a. Chronic, extensive w/u see OV note 09-2010. b. [place] 2011: ormal with RA 5 RV 31/2 PA 27/12 (19) PCW 10 CO normal. No evidence of shunting with sitting up in cath lab. c. CPX 2011: see report. d. Prior fluoro of diaphragm -  R diaphragm elevated at rest but both moved with inspiration.   . Fatty liver   . GERD (gastroesophageal reflux disease)    a. Hx GERD/esophageal dysmotility followed by Dr. Olevia Perches.   Marland Kitchen Head injury due to trauma   . Headache    Optic migraine  . Hemorrhoids   . Hiatal hernia    a. s/p Nissen fundoplication 7035.  Marland Kitchen Hyperlipidemia    a. patient unwilling to use statins.  . Hypertensive heart disease   . IBS (irritable bowel syndrome)   . Macular degeneration 03/2009   Dr. Rosana Hoes  . Myocardial infarction ([place]) 2017  . Neuropathy    a. Hands, feet, legs.  . Orthostatic hypotension   .  Osteomyelitis ([place])    a. 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 01/2016 s/p RLE transtibial amputation 2/2 Charcot rocker-bottom deformity and insensate neuropathy ulceration.  . Osteoporosis   . PE (pulmonary embolism) 12/2007   a. PE/DVT after neck surgery 2009. b. coumadin d/c 10-2008.  Marland Kitchen Pneumonia 2015ish  . PONV (postoperative nausea and vomiting) 2009   neck surgery  . PPD positive   . Pulmonary nodule    incidental per CT:  Pet scan 4-9: likely benign, CT 08-2009 no change, no further CTs (Dr. Gwenette Greet)  . Recurrent UTI   . RSD (reflex sympathetic  dystrophy)    a. Chronic pain.  Marland Kitchen Spinal stenosis   . Type II diabetes mellitus ([facility])    no on medication  . Venous insufficiency    a. Contributing to LEE.    Past Surgical History:  Procedure Laterality Date  . AMPUTATION  03/06/2012   Procedure: AMPUTATION FOOT;  Surgeon: Newt Minion, MD;  Location: [facility];  Service: Orthopedics;  Laterality: Left;  FIFTH RAY AMPUTATION   . AMPUTATION Right 02/18/2016   Procedure: AMPUTATION BELOW KNEE;  Surgeon: Newt Minion, MD;  Location: [facility];  Service: Orthopedics;  Laterality: Right;  . AMPUTATION Left 11/22/2016   Procedure: Amputation 4th Toe Left Foot at Metatarsophalangeal Joint;  Surgeon: Newt Minion, MD;  Location: [facility];  Service: Orthopedics;  Laterality: Left;  . ANKLE FUSION  09/27/2012   Procedure: ANKLE FUSION;  Surgeon: Newt Minion, MD;  Location: [facility];  Service: Orthopedics;  Laterality: Left;  Left Tibiocalcaneal Fusion  . ANKLE FUSION Right 05/09/2013   Procedure: ANKLE FUSION;  Surgeon: Newt Minion, MD;  Location: [facility];  Service: Orthopedics;  Laterality: Right;  Excision Osteomyelitis Base 1st MT Right Foot, Fusion Medial Column  . ANTERIOR CERVICAL DECOMP/DISCECTOMY FUSION  12/18/07   For OA,  Dr. Lorin Mercy:  fu by a PE  . CARDIAC CATHETERIZATION  05/2010    at [facility]  . CARPAL TUNNEL RELEASE Left 09/2011  . CATARACT EXTRACTION W/ INTRAOCULAR LENS  IMPLANT, BILATERAL  2004   feb 2004 left, aug 2004 right  . CHOLECYSTECTOMY  03/2002  . CORONARY ANGIOPLASTY  10/31/2013  . CORONARY ARTERY BYPASS GRAFT  09/27/2016  . CYST REMOVAL HAND  06/2003  . FOOT BONE EXCISION Right 06/2009  . LAPAROSCOPIC RIGHT HEMI COLECTOMY N/A 01/08/2015   Procedure: LAPAROSCOPIC ASSISTED RIGHT HEMI COLECTOMY;  Surgeon: Johnathan Hausen, MD;  Location: [facility];  Service: General;  Laterality: N/A;  . LEFT HEART CATHETERIZATION WITH CORONARY ANGIOGRAM N/A 10/31/2013   Procedure: LEFT HEART CATHETERIZATION WITH CORONARY ANGIOGRAM;  Surgeon: Jettie Booze, MD;  Location: [facility] CATH LAB;  Service: Cardiovascular;  Laterality: N/A;  . NASAL SEPTUM SURGERY  09/1963  . NISSEN FUNDOPLICATION  05/29/7543  . PERCUTANEOUS CORONARY INTERVENTION-BALLOON ONLY  10/31/2013   Procedure: PERCUTANEOUS CORONARY INTERVENTION-BALLOON ONLY;  Surgeon: Jettie Booze, MD;  Location: [facility] CATH LAB;  Service: Cardiovascular;;  . ROTATOR CUFF REPAIR Right 07/2007   Dr. Percell Miller  . ROTATOR CUFF REPAIR Left 11/2004  . stent in left leg, behind knee  06/27/2017   x2, done at [facility] cardiology in [facility]  . TOE AMPUTATION Right 08/2008   3rd toe, Dr. Sharol Given due to osteomyelitis  . VAGINAL HYSTERECTOMY  03/1975  . VEIN LIGATION Bilateral 03/1966  . VENA CAVA FILTER PLACEMENT  01/2010   green filter; "due to  blood clots"  . WISDOM TOOTH EXTRACTION  06/2007    Social History   Socioeconomic History  . Marital status: Divorced    Spouse name: Not on file  . Number of children: 3  . Years of education: 72  . Highest education level: Not on file  Occupational History  . Occupation: Retired     Comment: from Orth[employer]   Social Needs  . Financial resource strain: Not on file  . Food insecurity:    Worry: Not on file    Inability: Not on file  . Transportation needs:    Medical: Not on file    Non-medical: Not on file  Tobacco Use  . Smoking status: Never Smoker  . Smokeless tobacco: Never Used  . Tobacco comment: tried for a few months in college.   Substance and Sexual Activity  . Alcohol use: No    Alcohol/week: 0.0 standard drinks  . Drug use: No  . Sexual activity: Not Currently    Birth control/protection: Post-menopausal  Lifestyle  . Physical activity:    Days per week: Not on file    Minutes per session: Not on file  . Stress: Not on file  Relationships  . Social connections:    Talks on phone: Not on file    Gets together: Not on file    Attends religious service: Not on file    Active member of club or organization: Not on file     Attends meetings of clubs or organizations: Not on file    Relationship status: Not on file  . Intimate partner violence:    Fear of current or ex partner: Not on file    Emotionally abused: Not on file    Physically abused: Not on file    Forced sexual activity: Not on file  Other Topics Concern  . Not on file  Social History Narrative   Daughter lives w/ her and another daughter lives in town   1 son in [place]   Son comes home every 4 weeks to visit   Caffeine use:  Tea 1/day w/ dinner   Coffee occass             Allergies as of 02/24/2019      Reactions   Cymbalta [duloxetine Hcl] Swelling   Swelling in legs   Gabapentin Swelling   Swelling in legs Swelling in legs   Silicone Hives, Itching, Dermatitis, Rash   Metformin And Related Other (See Comments)   dizzy, tired, chills, diarrhea, and nausea   Adhesive [tape] Rash   rash rash   Cefazolin Hives   Hives Hives   Ciprofloxacin Other (See Comments)   Other reaction(s): Other (See Comments) Per pt, caused body aches Per pt, caused body aches   Clorazepate Dipotassium Other (See Comments)   Unknown reaction   Dilaudid [hydromorphone Hcl] Other (See Comments)    confused, intense itching   Doxycycline Rash   Enablex [darifenacin Hydrobromide Er] Other (See Comments)   Hypotension, near syncope   Levofloxacin Other (See Comments)   Causes wrist pain   Lyrica [pregabalin] Swelling   Swelling in legs   Methadone Hcl Other (See Comments)   Reaction unknown   Morphine And Related Other (See Comments)   Confusion, constipation.   Talwin [pentazocine] Other (See Comments)   "climbing walls" anxiety      Medication List       Accurate as of Feb 24, 2019 11:59 PM. Always use your most recent med  list.        Alpha-Lipoic Acid 600 MG Caps Take 600 mg by mouth 2 (two) times daily.   amitriptyline 25 MG tablet Commonly known as:  ELAVIL Take 1 tablet (25 mg total) by mouth at bedtime.   atorvastatin 40 MG  tablet Commonly known as:  LIPITOR Take 40 mg by mouth daily.   carvedilol 25 MG tablet Commonly known as:  COREG Take 25 mg by mouth 2 (two) times daily with a meal.   clopidogrel 75 MG tablet Commonly known as:  PLAVIX Take 75 mg by mouth at bedtime.   diclofenac sodium 1 % Gel Commonly known as:  VOLTAREN APPLY 2-4 GRAMS TO AFFECTED AREA 3 TIMES DAILY AS NEEDED   ferrous sulfate 325 (65 FE) MG tablet Commonly known as:  FeroSul Take 1 tablet (325 mg total) by mouth 2 (two) times daily with a meal.   freestyle lancets CHECK BLOOD SUGAR TWICE A DAY   FREESTYLE LITE test strip Generic drug:  glucose blood USE TO CHECK BLOOD SUGAR NO MORE THAN TWICE DAILY   furosemide 20 MG tablet Commonly known as:  LASIX Take 20 mg by mouth daily.   HYDROcodone-acetaminophen 5-325 MG tablet Commonly known as:  NORCO/VICODIN Take 1 tablet by mouth every 6 (six) hours as needed for moderate pain.   hydrocortisone 2.5 % rectal cream Commonly known as:  ANUSOL-HC Place 1 application rectally 2 (two) times daily.   hydrocortisone 25 MG suppository Commonly known as:  ANUSOL-HC Place 1 suppository (25 mg total) rectally every 12 (twelve) hours.   lidocaine 5 % Commonly known as:  Lidoderm Place 1 patch onto the skin daily. Remove & Discard patch within 12 hours or as directed by MD   meclizine 25 MG tablet Commonly known as:  ANTIVERT Take 1 tablet (25 mg total) by mouth 2 (two) times daily as needed for dizziness.   methenamine 1 g tablet Commonly known as:  HIPREX Take 1 tablet by mouth 2 (two) times daily.   nitroGLYCERIN 0.4 MG SL tablet Commonly known as:  NITROSTAT Place 0.4 mg under the tongue every 5 (five) minutes as needed for chest pain (x 3 doses). Reported on 04/14/2016   omeprazole 20 MG capsule Commonly known as:  PRILOSEC Take 1 capsule (20 mg total) by mouth 2 (two) times daily.   ondansetron 4 MG disintegrating tablet Commonly known as:  ZOFRAN-ODT TAKE 1  TABLET BY MOUTH EVERY 8 HOURS AS NEEDED FOR NAUSEA OR VOMITING   OVER THE COUNTER MEDICATION 1 tablet 3 (three) times daily. Berberorine Gluco Defense   OVER THE COUNTER MEDICATION Take 1 capsule by mouth 2 (two) times daily. Integrative Digestive Formula   pramipexole 0.125 MG tablet Commonly known as:  MIRAPEX Take 1-2 tablets (0.125-0.25 mg total) by mouth at bedtime as needed.   PreserVision AREDS 2 Caps Take 1 capsule by mouth 2 (two) times daily.   REFRESH OP Place 1 drop into both eyes 3 (three) times daily as needed (dry eyes).   Vitamin B-12 500 MCG Subl Place 500 mcg under the tongue daily.   Vitamin D3 125 MCG (5000 UT) Caps Take 5,000 Units by mouth every evening. Reported on 11/24/2015   Xarelto 2.5 MG Tabs tablet Generic drug:  rivaroxaban Take 2.5 mg by mouth 2 (two) times daily.           Objective:   Physical Exam There were no vitals taken for this visit. This is virtual video visit, patient is  alert oriented x3, no apparent distress    Assessment     Assessment DM, Neuropathy, amputations due to Charcot feet. - metformin intolerant  (lethargic) HTN  Hyperlipidemia LUNG MASS -incidental per CT 2009,  PET scan 01-2008: likely benign, CT 08-2009 no change, no further CTs per Dr Gwenette Greet - Had a CT in [place] 09-2016 "spiculated mass", saw Dr Lamonte Sakai,  CT  09/2017 stable CV: ---CAD sees Dr Meda Coffee and a cardiologist in [facility] --NSTEMI 09-2016. Outside hospital: Cath 2 vessel disease, CABG 2, LIMA to LAD and Diag   ---Chronic diastolic CHF ---PVD: Aortogram and a stent L Leg Dr Feliberto Gottron, 06-27-2017 @ [facility] (per pt) GI: GERD, IBS, colon polyps, PUD, abdominal pain "post polypectomy syndrome" naueas meds prn (antivert-zofran, gets nausea when car traveling) Osteoporosis  MSK,pain mngmt : --Reflex sympathetic dystrophy --Neuropathy --DJD --Spinal stenosis  --H/o Osteomyelitis  Foot --s/p amputation:  4th 5th L toes , R BKA  (01-2016, dx osteomyelitis) w/  phantom pain  -- sees Dr Sharol Given prn Takes nortriptyline also for leg cramps. She had local injections with Dr. Maia Petties 2016 w/o apparent help. She took oxycodone after surgery and that did NOT seem to help better than hydrocodone. Intolerant to Cymbalta, Neurontin ; Lyrica helped but caused edema. Recurrent UTIs: Dr Junious Silk  Venous insufficiency  H/o  + PPD H/o hypothyroidism: TSHs stable H/o dyspnea  PLAN:   DM: Diet controlled, check A1c HTN: Currently on carvedilol, Lasix, BPs are check regularly, last time was 141/80.  Check a CMP and CBC Hyperlipidemia: On Lipitor, check a FLP CAD, CHF: Continue carvedilol, Plavix, Lasix, Xarelto.  Denies chest pain. RSD, neuropathy, DJD: RF hydrocodone. Recurrent UTIs: Not a problem recently. Plan: We will contact his son and schedule labs and a follow-up in 4 months. Son's phone [phone]      I discussed the assessment and treatment plan with the patient. The patient was provided an opportunity to ask questions and all were answered. The patient agreed with the plan and demonstrated an understanding of the instructions.

## 2019-02-25 ENCOUNTER — Telehealth: Payer: Self-pay

## 2019-02-25 DIAGNOSIS — M25512 Pain in left shoulder: Secondary | ICD-10-CM | POA: Diagnosis not present

## 2019-02-25 DIAGNOSIS — G894 Chronic pain syndrome: Secondary | ICD-10-CM | POA: Diagnosis not present

## 2019-02-25 DIAGNOSIS — M6281 Muscle weakness (generalized): Secondary | ICD-10-CM | POA: Diagnosis not present

## 2019-02-25 DIAGNOSIS — G8921 Chronic pain due to trauma: Secondary | ICD-10-CM | POA: Diagnosis not present

## 2019-02-25 DIAGNOSIS — M25522 Pain in left elbow: Secondary | ICD-10-CM | POA: Diagnosis not present

## 2019-02-25 DIAGNOSIS — M25562 Pain in left knee: Secondary | ICD-10-CM | POA: Diagnosis not present

## 2019-02-25 DIAGNOSIS — M545 Low back pain: Secondary | ICD-10-CM | POA: Diagnosis not present

## 2019-02-25 DIAGNOSIS — R2689 Other abnormalities of gait and mobility: Secondary | ICD-10-CM | POA: Diagnosis not present

## 2019-02-25 DIAGNOSIS — G8929 Other chronic pain: Secondary | ICD-10-CM | POA: Diagnosis not present

## 2019-02-25 NOTE — Telephone Encounter (Signed)
Copied from [place] [phone]. Topic: Appointment Scheduling - Scheduling Inquiry for Clinic >> Feb 25, 2019 11:55 AM [name] L wrote: Reason for CRM:   Pt states she is returning a call about scheduling an appointment.  Tried calling office 3x.

## 2019-02-25 NOTE — Assessment & Plan Note (Signed)
DM: Diet controlled, check A1c HTN: Currently on carvedilol, Lasix, BPs are check regularly, last time was 141/80.  Check a CMP and CBC Hyperlipidemia: On Lipitor, check a FLP CAD, CHF: Continue carvedilol, Plavix, Lasix, Xarelto.  Denies chest pain. RSD, neuropathy, DJD: RF hydrocodone. Recurrent UTIs: Not a problem recently. Plan: We will contact his son and schedule labs and a follow-up in 4 months. Son's phone [phone]

## 2019-02-25 NOTE — Telephone Encounter (Signed)
Lab appt scheduled.

## 2019-02-27 DIAGNOSIS — R2689 Other abnormalities of gait and mobility: Secondary | ICD-10-CM | POA: Diagnosis not present

## 2019-02-27 DIAGNOSIS — M25512 Pain in left shoulder: Secondary | ICD-10-CM | POA: Diagnosis not present

## 2019-02-27 DIAGNOSIS — G8929 Other chronic pain: Secondary | ICD-10-CM | POA: Diagnosis not present

## 2019-02-27 DIAGNOSIS — M542 Cervicalgia: Secondary | ICD-10-CM | POA: Diagnosis not present

## 2019-02-27 DIAGNOSIS — M6281 Muscle weakness (generalized): Secondary | ICD-10-CM | POA: Diagnosis not present

## 2019-02-27 DIAGNOSIS — G8921 Chronic pain due to trauma: Secondary | ICD-10-CM | POA: Diagnosis not present

## 2019-02-27 DIAGNOSIS — G894 Chronic pain syndrome: Secondary | ICD-10-CM | POA: Diagnosis not present

## 2019-02-27 DIAGNOSIS — M545 Low back pain: Secondary | ICD-10-CM | POA: Diagnosis not present

## 2019-02-27 DIAGNOSIS — M25522 Pain in left elbow: Secondary | ICD-10-CM | POA: Diagnosis not present

## 2019-02-27 DIAGNOSIS — M25562 Pain in left knee: Secondary | ICD-10-CM | POA: Diagnosis not present

## 2019-02-28 ENCOUNTER — Other Ambulatory Visit (INDEPENDENT_AMBULATORY_CARE_PROVIDER_SITE_OTHER): Payer: Medicare Other

## 2019-02-28 ENCOUNTER — Other Ambulatory Visit: Payer: Self-pay

## 2019-02-28 DIAGNOSIS — E782 Mixed hyperlipidemia: Secondary | ICD-10-CM

## 2019-02-28 DIAGNOSIS — E1142 Type 2 diabetes mellitus with diabetic polyneuropathy: Secondary | ICD-10-CM

## 2019-02-28 DIAGNOSIS — I10 Essential (primary) hypertension: Secondary | ICD-10-CM | POA: Diagnosis not present

## 2019-02-28 LAB — COMPREHENSIVE METABOLIC PANEL
ALT: 7 U/L (ref 0–35)
AST: 11 U/L (ref 0–37)
Albumin: 4.3 g/dL (ref 3.5–5.2)
Alkaline Phosphatase: 60 U/L (ref 39–117)
BUN: 21 mg/dL (ref 6–23)
CO2: 26 mEq/L (ref 19–32)
Calcium: 9.2 mg/dL (ref 8.4–10.5)
Chloride: 104 mEq/L (ref 96–112)
Creatinine, Ser: 1.2 mg/dL (ref 0.40–1.20)
GFR: 43.04 mL/min — ABNORMAL LOW (ref >60.00)
Glucose, Bld: 162 mg/dL — ABNORMAL HIGH (ref 70–99)
Potassium: 4.6 mEq/L (ref 3.5–5.1)
Sodium: 139 mEq/L (ref 135–145)
Total Bilirubin: 0.5 mg/dL (ref 0.2–1.2)
Total Protein: 6.6 g/dL (ref 6.0–8.3)

## 2019-02-28 LAB — CBC WITH DIFFERENTIAL/PLATELET
Basophils Absolute: 0 10*3/uL (ref 0.0–0.1)
Basophils Relative: 0.2 % (ref 0.0–3.0)
Eosinophils Absolute: 0.1 10*3/uL (ref 0.0–0.7)
Eosinophils Relative: 2.4 % (ref 0.0–5.0)
HCT: 38.3 % (ref 36.0–46.0)
Hemoglobin: 12.7 g/dL (ref 12.0–15.0)
Lymphocytes Relative: 33.8 % (ref 12.0–46.0)
Lymphs Abs: 1.4 10*3/uL (ref 0.7–4.0)
MCHC: 33.3 g/dL (ref 30.0–36.0)
MCV: 94.1 fl (ref 78.0–100.0)
Monocytes Absolute: 0.2 10*3/uL (ref 0.1–1.0)
Monocytes Relative: 5.9 % (ref 3.0–12.0)
Neutro Abs: 2.3 10*3/uL (ref 1.4–7.7)
Neutrophils Relative %: 57.7 % (ref 43.0–77.0)
Platelets: 148 10*3/uL — ABNORMAL LOW (ref 150.0–400.0)
RBC: 4.06 Mil/uL (ref 3.87–5.11)
RDW: 13.5 % (ref 11.5–15.5)
WBC: 4 10*3/uL (ref 4.0–10.5)

## 2019-02-28 LAB — LIPID PANEL
Cholesterol: 137 mg/dL (ref 0–200)
HDL: 40.9 mg/dL (ref >39.00)
NonHDL: 96.27
Total CHOL/HDL Ratio: 3
Triglycerides: 227 mg/dL — ABNORMAL HIGH (ref 0.0–149.0)
VLDL: 45.4 mg/dL — ABNORMAL HIGH (ref 0.0–40.0)

## 2019-02-28 LAB — HEMOGLOBIN A1C: Hgb A1c MFr Bld: 6.2 % (ref 4.6–6.5)

## 2019-02-28 LAB — LDL CHOLESTEROL, DIRECT: Direct LDL: 44 mg/dL

## 2019-03-04 DIAGNOSIS — M6281 Muscle weakness (generalized): Secondary | ICD-10-CM | POA: Diagnosis not present

## 2019-03-04 DIAGNOSIS — M545 Low back pain: Secondary | ICD-10-CM | POA: Diagnosis not present

## 2019-03-04 DIAGNOSIS — G8929 Other chronic pain: Secondary | ICD-10-CM | POA: Diagnosis not present

## 2019-03-04 DIAGNOSIS — M25522 Pain in left elbow: Secondary | ICD-10-CM | POA: Diagnosis not present

## 2019-03-04 DIAGNOSIS — R2689 Other abnormalities of gait and mobility: Secondary | ICD-10-CM | POA: Diagnosis not present

## 2019-03-04 DIAGNOSIS — G8921 Chronic pain due to trauma: Secondary | ICD-10-CM | POA: Diagnosis not present

## 2019-03-04 DIAGNOSIS — M542 Cervicalgia: Secondary | ICD-10-CM | POA: Diagnosis not present

## 2019-03-04 DIAGNOSIS — M25512 Pain in left shoulder: Secondary | ICD-10-CM | POA: Diagnosis not present

## 2019-03-04 DIAGNOSIS — M25562 Pain in left knee: Secondary | ICD-10-CM | POA: Diagnosis not present

## 2019-03-04 DIAGNOSIS — G894 Chronic pain syndrome: Secondary | ICD-10-CM | POA: Diagnosis not present

## 2019-03-06 DIAGNOSIS — G894 Chronic pain syndrome: Secondary | ICD-10-CM | POA: Diagnosis not present

## 2019-03-06 DIAGNOSIS — M542 Cervicalgia: Secondary | ICD-10-CM | POA: Diagnosis not present

## 2019-03-06 DIAGNOSIS — M545 Low back pain: Secondary | ICD-10-CM | POA: Diagnosis not present

## 2019-03-06 DIAGNOSIS — G8921 Chronic pain due to trauma: Secondary | ICD-10-CM | POA: Diagnosis not present

## 2019-03-06 DIAGNOSIS — M25562 Pain in left knee: Secondary | ICD-10-CM | POA: Diagnosis not present

## 2019-03-06 DIAGNOSIS — G8929 Other chronic pain: Secondary | ICD-10-CM | POA: Diagnosis not present

## 2019-03-06 DIAGNOSIS — M6281 Muscle weakness (generalized): Secondary | ICD-10-CM | POA: Diagnosis not present

## 2019-03-06 DIAGNOSIS — M25522 Pain in left elbow: Secondary | ICD-10-CM | POA: Diagnosis not present

## 2019-03-06 DIAGNOSIS — R2689 Other abnormalities of gait and mobility: Secondary | ICD-10-CM | POA: Diagnosis not present

## 2019-03-06 DIAGNOSIS — M25512 Pain in left shoulder: Secondary | ICD-10-CM | POA: Diagnosis not present

## 2019-03-11 DIAGNOSIS — M6281 Muscle weakness (generalized): Secondary | ICD-10-CM | POA: Diagnosis not present

## 2019-03-11 DIAGNOSIS — G894 Chronic pain syndrome: Secondary | ICD-10-CM | POA: Diagnosis not present

## 2019-03-11 DIAGNOSIS — M25512 Pain in left shoulder: Secondary | ICD-10-CM | POA: Diagnosis not present

## 2019-03-11 DIAGNOSIS — M25522 Pain in left elbow: Secondary | ICD-10-CM | POA: Diagnosis not present

## 2019-03-11 DIAGNOSIS — M545 Low back pain: Secondary | ICD-10-CM | POA: Diagnosis not present

## 2019-03-11 DIAGNOSIS — M542 Cervicalgia: Secondary | ICD-10-CM | POA: Diagnosis not present

## 2019-03-11 DIAGNOSIS — G8929 Other chronic pain: Secondary | ICD-10-CM | POA: Diagnosis not present

## 2019-03-11 DIAGNOSIS — R2689 Other abnormalities of gait and mobility: Secondary | ICD-10-CM | POA: Diagnosis not present

## 2019-03-11 DIAGNOSIS — G8921 Chronic pain due to trauma: Secondary | ICD-10-CM | POA: Diagnosis not present

## 2019-03-11 DIAGNOSIS — M25562 Pain in left knee: Secondary | ICD-10-CM | POA: Diagnosis not present

## 2019-03-13 DIAGNOSIS — M25522 Pain in left elbow: Secondary | ICD-10-CM | POA: Diagnosis not present

## 2019-03-13 DIAGNOSIS — M6281 Muscle weakness (generalized): Secondary | ICD-10-CM | POA: Diagnosis not present

## 2019-03-13 DIAGNOSIS — G8929 Other chronic pain: Secondary | ICD-10-CM | POA: Diagnosis not present

## 2019-03-13 DIAGNOSIS — M25562 Pain in left knee: Secondary | ICD-10-CM | POA: Diagnosis not present

## 2019-03-13 DIAGNOSIS — R2689 Other abnormalities of gait and mobility: Secondary | ICD-10-CM | POA: Diagnosis not present

## 2019-03-13 DIAGNOSIS — M542 Cervicalgia: Secondary | ICD-10-CM | POA: Diagnosis not present

## 2019-03-13 DIAGNOSIS — M545 Low back pain: Secondary | ICD-10-CM | POA: Diagnosis not present

## 2019-03-13 DIAGNOSIS — G894 Chronic pain syndrome: Secondary | ICD-10-CM | POA: Diagnosis not present

## 2019-03-13 DIAGNOSIS — G8921 Chronic pain due to trauma: Secondary | ICD-10-CM | POA: Diagnosis not present

## 2019-03-13 DIAGNOSIS — M25512 Pain in left shoulder: Secondary | ICD-10-CM | POA: Diagnosis not present

## 2019-03-18 DIAGNOSIS — M25522 Pain in left elbow: Secondary | ICD-10-CM | POA: Diagnosis not present

## 2019-03-18 DIAGNOSIS — M25512 Pain in left shoulder: Secondary | ICD-10-CM | POA: Diagnosis not present

## 2019-03-18 DIAGNOSIS — M542 Cervicalgia: Secondary | ICD-10-CM | POA: Diagnosis not present

## 2019-03-18 DIAGNOSIS — G894 Chronic pain syndrome: Secondary | ICD-10-CM | POA: Diagnosis not present

## 2019-03-18 DIAGNOSIS — G8921 Chronic pain due to trauma: Secondary | ICD-10-CM | POA: Diagnosis not present

## 2019-03-18 DIAGNOSIS — G8929 Other chronic pain: Secondary | ICD-10-CM | POA: Diagnosis not present

## 2019-03-18 DIAGNOSIS — R2689 Other abnormalities of gait and mobility: Secondary | ICD-10-CM | POA: Diagnosis not present

## 2019-03-18 DIAGNOSIS — M545 Low back pain: Secondary | ICD-10-CM | POA: Diagnosis not present

## 2019-03-18 DIAGNOSIS — M25562 Pain in left knee: Secondary | ICD-10-CM | POA: Diagnosis not present

## 2019-03-18 DIAGNOSIS — M6281 Muscle weakness (generalized): Secondary | ICD-10-CM | POA: Diagnosis not present

## 2019-03-20 DIAGNOSIS — M25522 Pain in left elbow: Secondary | ICD-10-CM | POA: Diagnosis not present

## 2019-03-20 DIAGNOSIS — M545 Low back pain: Secondary | ICD-10-CM | POA: Diagnosis not present

## 2019-03-20 DIAGNOSIS — G894 Chronic pain syndrome: Secondary | ICD-10-CM | POA: Diagnosis not present

## 2019-03-20 DIAGNOSIS — G8921 Chronic pain due to trauma: Secondary | ICD-10-CM | POA: Diagnosis not present

## 2019-03-20 DIAGNOSIS — M542 Cervicalgia: Secondary | ICD-10-CM | POA: Diagnosis not present

## 2019-03-20 DIAGNOSIS — G8929 Other chronic pain: Secondary | ICD-10-CM | POA: Diagnosis not present

## 2019-03-20 DIAGNOSIS — M25562 Pain in left knee: Secondary | ICD-10-CM | POA: Diagnosis not present

## 2019-03-20 DIAGNOSIS — M25512 Pain in left shoulder: Secondary | ICD-10-CM | POA: Diagnosis not present

## 2019-03-20 DIAGNOSIS — M6281 Muscle weakness (generalized): Secondary | ICD-10-CM | POA: Diagnosis not present

## 2019-03-20 DIAGNOSIS — R2689 Other abnormalities of gait and mobility: Secondary | ICD-10-CM | POA: Diagnosis not present

## 2019-03-21 ENCOUNTER — Telehealth: Payer: Self-pay | Admitting: Internal Medicine

## 2019-03-21 MED ORDER — HYDROCODONE-ACETAMINOPHEN 5-325 MG PO TABS: 1.0000 | ORAL_TABLET | Freq: Four times a day (QID) | ORAL | 0 refills | Status: DC | PRN

## 2019-03-21 NOTE — Telephone Encounter (Signed)
Hydrocodone refill.   Last OV: 02/24/2019  Last Fill: 02/24/2019 #120 and 0RF UDS: 01/18/2018 Low risk

## 2019-03-21 NOTE — Telephone Encounter (Signed)
sent 

## 2019-03-21 NOTE — Telephone Encounter (Signed)
Copied from [place] [phone]. Topic: Quick Communication - Rx Refill/Question >> Mar 21, 2019  1:33 PM Rayann Heman wrote: Medication: HYDROcodone-acetaminophen (NORCO/VICODIN) 5-325 MG tablet [[id number]]   Has the patient contacted their pharmacy? No Preferred Pharmacy (with phone number or street name): [facility] [place], [place] - [address] AT [place] [phone] (Phone) [phone] (Fax)    Agent: Please be advised that RX refills may take up to 3 business days. We ask that you follow-up with your pharmacy.

## 2019-03-27 ENCOUNTER — Telehealth: Payer: Self-pay | Admitting: Family Medicine

## 2019-03-27 ENCOUNTER — Telehealth: Payer: Self-pay

## 2019-03-27 ENCOUNTER — Other Ambulatory Visit: Payer: Self-pay

## 2019-03-27 DIAGNOSIS — R110 Nausea: Secondary | ICD-10-CM

## 2019-03-27 MED ORDER — ONDANSETRON 4 MG PO TBDP: ORAL_TABLET | ORAL | 0 refills | Status: DC

## 2019-03-27 NOTE — Telephone Encounter (Signed)
I sent 60 tablets today to [facility]. See if you can transfer to pharmacy of choice. Thank you

## 2019-03-27 NOTE — Telephone Encounter (Signed)
[facility] to transfer medication

## 2019-03-27 NOTE — Telephone Encounter (Signed)
Received paperwork from [facility] where the patient is requesting a refill on her ondansetron (ZOFRAN-ODT) 4 MG disintegrating tablet.

## 2019-03-27 NOTE — Telephone Encounter (Signed)
Mrs Leandro is asking for Dr. Gavin Potters nurse to call in regards to getting a prosthetic sock, please advise?

## 2019-03-27 NOTE — Telephone Encounter (Signed)
Pt is calling in for a 43mo supplly of ondansetron (ZOFRAN-ODT) 4 MG disintegrating tablet to be sent to [id number] - [place], [place] - [address]

## 2019-03-31 NOTE — Telephone Encounter (Signed)
I called and sw pt and she is in [facility] and wanted to know how she could get the Vive shrinker that Dr. Sharol Given had recommended for her at the last visit 11/2018 advised that I could write order for [facility] and her son that is traveling up this way in the next week will pick up the rx at the desk and can purchase for pt. She will call with any other questions s/p a right BKA

## 2019-04-08 ENCOUNTER — Other Ambulatory Visit: Payer: Self-pay | Admitting: Internal Medicine

## 2019-04-08 DIAGNOSIS — G43009 Migraine without aura, not intractable, without status migrainosus: Secondary | ICD-10-CM

## 2019-04-08 MED ORDER — AMITRIPTYLINE HCL 25 MG PO TABS: 25.0000 mg | ORAL_TABLET | Freq: Every day | ORAL | 3 refills | Status: DC

## 2019-04-08 NOTE — Addendum Note (Signed)
Addended byDamita Dunnings D on: 04/08/2019 02:28 PM   Modules accepted: Orders

## 2019-04-10 ENCOUNTER — Telehealth: Payer: Self-pay | Admitting: Orthopedic Surgery

## 2019-04-10 NOTE — Telephone Encounter (Signed)
Patient called advised she called [facility] and they do not have what she need. Patient said her son Dellis Filbert will pick up Rx tomorrow. Patient asked where else can she call for the shrinker\? The number to contact patient is [phone]

## 2019-04-10 NOTE — Telephone Encounter (Signed)
Per Dr. Sharol Given we can have prosthetic and orthotic in [place] can email him for the shrinker and he can bill them direct and they can sell to pt. Will ship for 18$ each medicare will allow for 50$ reimbursement. I called and they are only open 8-4:30 will try again tomorrow. Phone # [phone] and fax is [phone]. Will call pt and advise.

## 2019-04-11 NOTE — Telephone Encounter (Signed)
I called this morning and was advised that Dannielle Huh who works with Dr. Sharol Given is at an other location that # is [phone] I called and sw Kelvin and advised of message below. He requested that I send the information to his email [email or web address] emailed information advised to call with questions he will contact Dr. Sharol Given directly for shrinker shipment and patient is aware.

## 2019-04-17 DIAGNOSIS — M25562 Pain in left knee: Secondary | ICD-10-CM | POA: Diagnosis not present

## 2019-04-17 DIAGNOSIS — R2689 Other abnormalities of gait and mobility: Secondary | ICD-10-CM | POA: Diagnosis not present

## 2019-04-17 DIAGNOSIS — M25512 Pain in left shoulder: Secondary | ICD-10-CM | POA: Diagnosis not present

## 2019-04-17 DIAGNOSIS — M25522 Pain in left elbow: Secondary | ICD-10-CM | POA: Diagnosis not present

## 2019-04-17 DIAGNOSIS — G8929 Other chronic pain: Secondary | ICD-10-CM | POA: Diagnosis not present

## 2019-04-17 DIAGNOSIS — G8921 Chronic pain due to trauma: Secondary | ICD-10-CM | POA: Diagnosis not present

## 2019-04-17 DIAGNOSIS — G894 Chronic pain syndrome: Secondary | ICD-10-CM | POA: Diagnosis not present

## 2019-04-17 DIAGNOSIS — M545 Low back pain: Secondary | ICD-10-CM | POA: Diagnosis not present

## 2019-04-17 DIAGNOSIS — M6281 Muscle weakness (generalized): Secondary | ICD-10-CM | POA: Diagnosis not present

## 2019-04-17 DIAGNOSIS — M542 Cervicalgia: Secondary | ICD-10-CM | POA: Diagnosis not present

## 2019-04-18 ENCOUNTER — Ambulatory Visit: Payer: Medicare Other | Admitting: Internal Medicine

## 2019-04-22 ENCOUNTER — Telehealth: Payer: Self-pay | Admitting: Gastroenterology

## 2019-04-22 DIAGNOSIS — G8929 Other chronic pain: Secondary | ICD-10-CM | POA: Diagnosis not present

## 2019-04-22 DIAGNOSIS — M25512 Pain in left shoulder: Secondary | ICD-10-CM | POA: Diagnosis not present

## 2019-04-22 DIAGNOSIS — M6281 Muscle weakness (generalized): Secondary | ICD-10-CM | POA: Diagnosis not present

## 2019-04-22 DIAGNOSIS — M542 Cervicalgia: Secondary | ICD-10-CM | POA: Diagnosis not present

## 2019-04-22 DIAGNOSIS — M25562 Pain in left knee: Secondary | ICD-10-CM | POA: Diagnosis not present

## 2019-04-22 DIAGNOSIS — G894 Chronic pain syndrome: Secondary | ICD-10-CM | POA: Diagnosis not present

## 2019-04-22 DIAGNOSIS — M25522 Pain in left elbow: Secondary | ICD-10-CM | POA: Diagnosis not present

## 2019-04-22 DIAGNOSIS — G8921 Chronic pain due to trauma: Secondary | ICD-10-CM | POA: Diagnosis not present

## 2019-04-22 DIAGNOSIS — R2689 Other abnormalities of gait and mobility: Secondary | ICD-10-CM | POA: Diagnosis not present

## 2019-04-22 DIAGNOSIS — M545 Low back pain: Secondary | ICD-10-CM | POA: Diagnosis not present

## 2019-04-22 MED ORDER — HYDROCORTISONE (PERIANAL) 2.5 % EX CREA: 1.0000 "application " | TOPICAL_CREAM | Freq: Two times a day (BID) | CUTANEOUS | 1 refills | Status: DC

## 2019-04-22 NOTE — Telephone Encounter (Signed)
Patient called in wanting to know if she can get another prescription for the medication. If so she will need it to be called in at the [facility] [place], [place], [place] [address] [phone]

## 2019-04-24 ENCOUNTER — Telehealth: Payer: Self-pay | Admitting: Internal Medicine

## 2019-04-24 ENCOUNTER — Other Ambulatory Visit: Payer: Self-pay | Admitting: Internal Medicine

## 2019-04-24 DIAGNOSIS — M6281 Muscle weakness (generalized): Secondary | ICD-10-CM | POA: Diagnosis not present

## 2019-04-24 DIAGNOSIS — G894 Chronic pain syndrome: Secondary | ICD-10-CM | POA: Diagnosis not present

## 2019-04-24 DIAGNOSIS — M25562 Pain in left knee: Secondary | ICD-10-CM | POA: Diagnosis not present

## 2019-04-24 DIAGNOSIS — R2689 Other abnormalities of gait and mobility: Secondary | ICD-10-CM | POA: Diagnosis not present

## 2019-04-24 DIAGNOSIS — G8921 Chronic pain due to trauma: Secondary | ICD-10-CM | POA: Diagnosis not present

## 2019-04-24 DIAGNOSIS — M25522 Pain in left elbow: Secondary | ICD-10-CM | POA: Diagnosis not present

## 2019-04-24 DIAGNOSIS — M25512 Pain in left shoulder: Secondary | ICD-10-CM | POA: Diagnosis not present

## 2019-04-24 DIAGNOSIS — M542 Cervicalgia: Secondary | ICD-10-CM | POA: Diagnosis not present

## 2019-04-24 DIAGNOSIS — G8929 Other chronic pain: Secondary | ICD-10-CM | POA: Diagnosis not present

## 2019-04-24 DIAGNOSIS — M545 Low back pain: Secondary | ICD-10-CM | POA: Diagnosis not present

## 2019-04-24 MED ORDER — PRAMIPEXOLE DIHYDROCHLORIDE 0.125 MG PO TABS: 0.1250 mg | ORAL_TABLET | Freq: Every evening | ORAL | 1 refills | Status: DC | PRN

## 2019-04-24 MED ORDER — FERROUS SULFATE 325 (65 FE) MG PO TABS: 325.0000 mg | ORAL_TABLET | Freq: Two times a day (BID) | ORAL | 1 refills | Status: DC

## 2019-04-24 NOTE — Telephone Encounter (Signed)
Rxs sent

## 2019-04-24 NOTE — Telephone Encounter (Signed)
Medication: HYDROcodone-acetaminophen (NORCO/VICODIN) 5-325 MG tablet [[id number]]   Has the patient contacted their pharmacy? Yes  (Agent: If no, request that the patient contact the pharmacy for the refill.) (Agent: If yes, when and what did the pharmacy advise?)  Preferred Pharmacy (with phone number or street name): [place], [place] [address] [phone]  Agent: Please be advised that RX refills may take up to 3 business days. We ask that you follow-up with your pharmacy.

## 2019-04-24 NOTE — Telephone Encounter (Signed)
Medication: pramipexole (MIRAPEX) 0.125 MG tablet [[id number]] , ferrous sulfate (FEROSUL) 325 (65 FE) MG tablet [[id number]]   Has the patient contacted their pharmacy? Yes  (Agent: If no, request that the patient contact the pharmacy for the refill.) (Agent: If yes, when and what did the pharmacy advise?)  Preferred Pharmacy (with phone number or street name): [id number] - [place], [place] AT [address] [phone] (Phone) [phone] (Fax)    Agent: Please be advised that RX refills may take up to 3 business days. We ask that you follow-up with your pharmacy.

## 2019-04-24 NOTE — Telephone Encounter (Signed)
Hydrocodone refill.   Last OV: 02/24/2019 Last Fill: 03/21/2019 #120 and 0RF Pt sig: 1 tab q6h prn UDS: 01/18/2018 Low risk

## 2019-04-25 MED ORDER — HYDROCODONE-ACETAMINOPHEN 5-325 MG PO TABS: 1.0000 | ORAL_TABLET | Freq: Four times a day (QID) | ORAL | 0 refills | Status: DC | PRN

## 2019-04-25 NOTE — Telephone Encounter (Signed)
Sent!

## 2019-04-29 DIAGNOSIS — R2689 Other abnormalities of gait and mobility: Secondary | ICD-10-CM | POA: Diagnosis not present

## 2019-04-29 DIAGNOSIS — M542 Cervicalgia: Secondary | ICD-10-CM | POA: Diagnosis not present

## 2019-04-29 DIAGNOSIS — M545 Low back pain: Secondary | ICD-10-CM | POA: Diagnosis not present

## 2019-04-29 DIAGNOSIS — M25522 Pain in left elbow: Secondary | ICD-10-CM | POA: Diagnosis not present

## 2019-04-29 DIAGNOSIS — M6281 Muscle weakness (generalized): Secondary | ICD-10-CM | POA: Diagnosis not present

## 2019-04-29 DIAGNOSIS — G894 Chronic pain syndrome: Secondary | ICD-10-CM | POA: Diagnosis not present

## 2019-04-29 DIAGNOSIS — G8921 Chronic pain due to trauma: Secondary | ICD-10-CM | POA: Diagnosis not present

## 2019-04-29 DIAGNOSIS — M25562 Pain in left knee: Secondary | ICD-10-CM | POA: Diagnosis not present

## 2019-04-29 DIAGNOSIS — G8929 Other chronic pain: Secondary | ICD-10-CM | POA: Diagnosis not present

## 2019-04-29 DIAGNOSIS — M25512 Pain in left shoulder: Secondary | ICD-10-CM | POA: Diagnosis not present

## 2019-05-01 DIAGNOSIS — R300 Dysuria: Secondary | ICD-10-CM | POA: Diagnosis not present

## 2019-05-01 DIAGNOSIS — N3020 Other chronic cystitis without hematuria: Secondary | ICD-10-CM | POA: Diagnosis not present

## 2019-05-02 DIAGNOSIS — Z961 Presence of intraocular lens: Secondary | ICD-10-CM | POA: Diagnosis not present

## 2019-05-02 DIAGNOSIS — H04123 Dry eye syndrome of bilateral lacrimal glands: Secondary | ICD-10-CM | POA: Diagnosis not present

## 2019-05-02 DIAGNOSIS — H01001 Unspecified blepharitis right upper eyelid: Secondary | ICD-10-CM | POA: Diagnosis not present

## 2019-05-02 DIAGNOSIS — H01002 Unspecified blepharitis right lower eyelid: Secondary | ICD-10-CM | POA: Diagnosis not present

## 2019-05-02 DIAGNOSIS — H01004 Unspecified blepharitis left upper eyelid: Secondary | ICD-10-CM | POA: Diagnosis not present

## 2019-05-02 LAB — HM DIABETES EYE EXAM

## 2019-05-08 ENCOUNTER — Telehealth: Payer: Self-pay | Admitting: Gastroenterology

## 2019-05-08 NOTE — Telephone Encounter (Signed)
Covid-19 screening questions   Do you now or have you had a fever in the last 14 days? No  Do you have any respiratory symptoms of shortness of breath or cough now or in the last 14 days? No  Do you have any family members or close contacts with diagnosed or suspected Covid-19 in the past 14 days? No  Have you been tested for Covid-19 and found to be positive? No        

## 2019-05-09 ENCOUNTER — Encounter: Payer: Self-pay | Admitting: Nurse Practitioner

## 2019-05-09 ENCOUNTER — Other Ambulatory Visit: Payer: Self-pay

## 2019-05-09 ENCOUNTER — Ambulatory Visit (INDEPENDENT_AMBULATORY_CARE_PROVIDER_SITE_OTHER): Payer: Medicare Other | Admitting: Nurse Practitioner

## 2019-05-09 VITALS — BP 130/70 | HR 72 | Ht 64.0 in

## 2019-05-09 DIAGNOSIS — I2510 Atherosclerotic heart disease of native coronary artery without angina pectoris: Secondary | ICD-10-CM

## 2019-05-09 DIAGNOSIS — K625 Hemorrhage of anus and rectum: Secondary | ICD-10-CM | POA: Diagnosis not present

## 2019-05-09 DIAGNOSIS — K5909 Other constipation: Secondary | ICD-10-CM | POA: Diagnosis not present

## 2019-05-09 DIAGNOSIS — I2583 Coronary atherosclerosis due to lipid rich plaque: Secondary | ICD-10-CM | POA: Diagnosis not present

## 2019-05-09 MED ORDER — HYDROCORTISONE (PERIANAL) 2.5 % EX CREA: 1.0000 "application " | TOPICAL_CREAM | Freq: Two times a day (BID) | CUTANEOUS | 0 refills | Status: DC

## 2019-05-09 NOTE — Progress Notes (Signed)
Chief Complaint:    Rectal bleeding   IMPRESSION and PLAN:    84 yo female with CAD / NSTEMI / CABG, PVD s/p partial amputation of RLE extremity on chronic anticoagulation presenting with :  1. Chronic intermittent rectal bleeding with BMs on anticoagulant. Previously prescribed Anusol for hemorrhoidal bleeding but she didn't apply INSIDE the rectum.. Additionally, she is still having problems with constipation.  -Discussed how to use Anusol with applicator to reach internal hemorrhoids. She will try this BID for 10 days.  Need to treat chronic constipation. Patient cannot remember her response to the MiraLAX and fiber we recommended at time of last visit.  Before prescribing a laxative and/or prescription medication I would like to see how she does on daily fiber and MiraLAX.  Patient will resume/start both of these on a daily basis.  If MiraLAX causes loose stool then she can decrease the dose to every other day as discussed in clinic.  Patient will call in a few weeks with a condition update.  If constipation not better than consider [name] or Linzess though I did add yet another medication to her regimen -she is interested in banding, information given. If bleeding persists then banding may be an option but would need to be off blood thinners.   2. Hx of colon polyps -3 polyps ranging 5 to 8 mm in size March 2017. Path c/w adenoma. Given age and co-morbidities she is no longer undergoing polyp surveillance colonoscopies.    HPI:     Patient is an 84 year old female with a history of CAD / NSTEMI / CABG, PVD s/p partial amputation of RLE extremity . She takes Plavix and Xarelto. Known to Dr. Silverio Decamp for history of constipation, hemorrhoids, and colon polyps,  last seen October 2019 for evaluation of rectal bleeding.  We started her on fiber and MiraLAX and a prescribed course of Anusol.    Patient returns today for evaluation of rectal discomfort and ongoing bleeding.  The   intermittent bleeding never stopped and has been ongoing for ~ 3 years. Patient cannot recall response to daily MiraLAX plus fiber, feels like MiraLAX may have caused loose stools. She used the Anusol cream but only externally (couldn't figure out how to use applicator). Constipation with straining is still a problem. She is taking fiber but not everyday.   Previous Endoscopies:  Colonoscopy Non-thrombosed external hemorrhoids found on digital rectal exam. - One 7 mm polyp in the ascending colon, removed with a cold snare. Resected and retrieved. - One 5 mm polyp in the transverse colon, removed with a cold biopsy forceps. Resected and retrieved. - One 8 mm polyp in the descending colon actively oozing blood, controlled bleeding and removed with a hot snare. Resected and retrieved. - Diverticulosis in the sigmoid colon and in the descending colon. ?nodule in appendicieal orifice biopsies obtained - The examined portion of the ileum was normal. - Non-bleeding internal hemorrhoids.  Data Reviewed:  Labs 02/28/19 hgb 12.7, MCV 94 Liver tests normal. Renal function normal  Review of systems:     No chest pain, no SOB, no fevers, no urinary sx   Past Medical History:  Diagnosis Date  . Abnormal echocardiogram    09/2013: mild LVH, mild focal basal hypertrophy of septum, EF 60-65%, normal WM, grade 1 diastolic dysfunction, MAC, mild LAE, ASA, PASP 34. Possible oscillating MV density - reviewed by MD and felt there was no significant abnormality other than MAC noted with mitral valve  and did not require TEE.  . Adenomatous colon polyp   . Allergic rhinitis   . Arthritis    "back; ankles; hands; knees" (10/31/2013)  . Bilateral sensorineural hearing loss   . CAD (coronary artery disease)    a. Nonobst in 2011. b. Abnormal nuc 09/2013 -> s/p cutting balloon to D2, mild LAD disease; c. 08/2015 MV: no ischemia, EF 71%.  . Cancer ([place])    cancerous polyps  . 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 1994 - malignant polyp removed during colonoscopy.  . Charcot's Foot    a.  01/2016 s/p RLE transtibial amputation 2/2 Charcot rocker-bottom deformity and insensate neuropathy ulceration.  . Chronic diastolic CHF (congestive heart failure) ([place])    a. 09/2013 Echo: EF 60-65%, mild LVH, Gr1 DD.  Marland Kitchen Diverticulosis   . DJD (degenerative joint disease)   . DVT (deep venous thrombosis) ([place]) 12/2007  . Dyspnea    a. Chronic, extensive w/u see OV note 09-2010. b. Sunol 2011: ormal with RA 5 RV 31/2 PA 27/12 (19) PCW 10 CO normal. No evidence of shunting with sitting up in cath lab. c. CPX 2011: see report. d. Prior fluoro of diaphragm -  R diaphragm elevated at rest but both moved with inspiration.   . Fatty liver   . GERD (gastroesophageal reflux disease)    a. Hx GERD/esophageal dysmotility followed by Dr. Olevia Perches.   Marland Kitchen Head injury due to trauma   . Headache    Optic migraine  . Hemorrhoids   . Hiatal hernia    a. s/p Nissen fundoplication 1700.  Marland Kitchen Hyperlipidemia    a. patient unwilling to use statins.  . Hypertensive heart disease   . IBS (irritable bowel syndrome)   . Macular degeneration 03/2009   Dr. Rosana Hoes  . Myocardial infarction ([place]) 2017  . Neuropathy    a. Hands, feet, legs.  . Orthostatic hypotension   . Osteomyelitis ([place])    a. 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 01/2016 s/p RLE transtibial amputation 2/2 Charcot rocker-bottom deformity and insensate neuropathy ulceration.  . Osteoporosis   . PE (pulmonary embolism) 12/2007   a. PE/DVT after neck surgery 2009. b. coumadin d/c 10-2008.  Marland Kitchen Pneumonia 2015ish  . PONV (postoperative nausea and vomiting) 2009   neck surgery  . PPD positive   . Pulmonary nodule    incidental per CT:  Pet scan 4-9: likely benign, CT 08-2009 no change, no further CTs (Dr. Gwenette Greet)  . Recurrent UTI   . RSD (reflex sympathetic dystrophy)    a. Chronic pain.  Marland Kitchen Spinal stenosis   . Type II  diabetes mellitus ([facility])    no on medication  . Venous insufficiency    a. Contributing to LEE.    Patient's surgical history, family medical history, social history, medications and allergies were all reviewed in Epic   Creatinine clearance cannot be calculated (Patient's most recent lab result is older than the maximum 21 days allowed.)  Current Outpatient Medications  Medication Sig Dispense Refill  . Alpha-Lipoic Acid 600 MG CAPS Take 600 mg by mouth 2 (two) times daily.    Marland Kitchen amitriptyline (ELAVIL) 25 MG tablet Take 1 tablet (25 mg total) by mouth at bedtime. 90 tablet 3  . atorvastatin (LIPITOR) 40 MG tablet Take 40 mg by mouth daily.    . carvedilol (COREG) 25 MG tablet Take 25 mg by mouth 2 (two) times daily with  a meal.    . Cholecalciferol (VITAMIN D3) 5000 UNITS CAPS Take 5,000 Units by mouth every evening. Reported on 11/24/2015    . clopidogrel (PLAVIX) 75 MG tablet Take 75 mg by mouth at bedtime.     . Cyanocobalamin (VITAMIN B-12) 500 MCG SUBL Place 500 mcg under the tongue daily.    . diclofenac sodium (VOLTAREN) 1 % GEL APPLY 2-4 GRAMS TO AFFECTED AREA 3 TIMES DAILY AS NEEDED 500 g 5  . ferrous sulfate (FEROSUL) 325 (65 FE) MG tablet Take 1 tablet (325 mg total) by mouth 2 (two) times daily with a meal. 180 tablet 1  . FREESTYLE LITE test strip USE TO CHECK BLOOD SUGAR NO MORE THAN TWICE DAILY 200 each 12  . furosemide (LASIX) 20 MG tablet Take 20 mg by mouth daily.    Marland Kitchen HYDROcodone-acetaminophen (NORCO/VICODIN) 5-325 MG tablet Take 1 tablet by mouth every 6 (six) hours as needed for moderate pain. 120 tablet 0  . hydrocortisone (ANUSOL-HC) 2.5 % rectal cream Place 1 application rectally 2 (two) times daily. 30 g 1  . hydrocortisone (ANUSOL-HC) 25 MG suppository Place 1 suppository (25 mg total) rectally every 12 (twelve) hours. 30 suppository 1  . Lancets (FREESTYLE) lancets CHECK BLOOD SUGAR TWICE DAILY 200 each 12  . lidocaine (LIDODERM) 5 % Place 1 patch onto the skin  daily. Remove & Discard patch within 12 hours or as directed by MD 30 patch 3  . meclizine (ANTIVERT) 25 MG tablet Take 1 tablet (25 mg total) by mouth 2 (two) times daily as needed for dizziness. 180 tablet 1  . methenamine (HIPREX) 1 g tablet Take 1 tablet by mouth 2 (two) times daily.  4  . Multiple Vitamins-Minerals (PRESERVISION AREDS 2) CAPS Take 1 capsule by mouth 2 (two) times daily.    . nitrofurantoin, macrocrystal-monohydrate, (MACROBID) 100 MG capsule Take 100 mg by mouth 2 (two) times daily.    . nitroGLYCERIN (NITROSTAT) 0.4 MG SL tablet Place 0.4 mg under the tongue every 5 (five) minutes as needed for chest pain (x 3 doses). Reported on 04/14/2016    . omeprazole (PRILOSEC) 20 MG capsule Take 1 capsule (20 mg total) by mouth 2 (two) times daily. 180 capsule 3  . ondansetron (ZOFRAN-ODT) 4 MG disintegrating tablet TAKE 1 TABLET BY MOUTH EVERY 8 HOURS AS NEEDED FOR NAUSEA OR VOMITING 60 tablet 0  . OVER THE COUNTER MEDICATION Take 1 capsule by mouth 2 (two) times daily. Integrative Digestive Formula    . OVER THE COUNTER MEDICATION 1 tablet 3 (three) times daily. Berberorine Gluco Defense     . Polyvinyl Alcohol-Povidone (REFRESH OP) Place 1 drop into both eyes 3 (three) times daily as needed (dry eyes).     . pramipexole (MIRAPEX) 0.125 MG tablet Take 1-2 tablets (0.125-0.25 mg total) by mouth at bedtime as needed. 180 tablet 1  . rivaroxaban (XARELTO) 2.5 MG TABS tablet Take 2.5 mg by mouth 2 (two) times daily.     No current facility-administered medications for this visit.     Physical Exam:     BP 130/70   Pulse 72   Ht _0  (1.626 m)   BMI 33.64 kg/m   GENERAL:  Pleasant female in NAD PSYCH: : Cooperative, normal affect EENT:  conjunctiva pink, mucous membranes moist, neck supple without masses CARDIAC:  RRR, no peripheral edema PULM: Normal respiratory effort, lungs CTA bilaterally, no wheezing ABDOMEN:  Limited exam in chair. Abdomen nondistended, soft, nontender,  normal bowel sounds  SKIN:  turgor, no lesions seen Musculoskeletal:  Normal muscle tone, normal strength NEURO: Alert and oriented x 3, no focal neurologic deficits   Tye Savoy , NP 05/09/2019, 9:22 AM

## 2019-05-09 NOTE — Patient Instructions (Addendum)
Anusol HC Cream - twice daily for 10 days; place inside rectum.   Use Miralax- 17 grams dissolved in at least 8 ounces of water or juice and drink daily.   Use Metamucil as directed.   Call us in a few weeks if your symptoms are not better.   Thank you for choosing me and [facility].  [place]

## 2019-05-12 DIAGNOSIS — N3020 Other chronic cystitis without hematuria: Secondary | ICD-10-CM | POA: Diagnosis not present

## 2019-05-13 DIAGNOSIS — M542 Cervicalgia: Secondary | ICD-10-CM | POA: Diagnosis not present

## 2019-05-13 DIAGNOSIS — G8929 Other chronic pain: Secondary | ICD-10-CM | POA: Diagnosis not present

## 2019-05-13 DIAGNOSIS — M6281 Muscle weakness (generalized): Secondary | ICD-10-CM | POA: Diagnosis not present

## 2019-05-13 DIAGNOSIS — R2689 Other abnormalities of gait and mobility: Secondary | ICD-10-CM | POA: Diagnosis not present

## 2019-05-13 DIAGNOSIS — M545 Low back pain: Secondary | ICD-10-CM | POA: Diagnosis not present

## 2019-05-13 DIAGNOSIS — G8921 Chronic pain due to trauma: Secondary | ICD-10-CM | POA: Diagnosis not present

## 2019-05-13 DIAGNOSIS — M25562 Pain in left knee: Secondary | ICD-10-CM | POA: Diagnosis not present

## 2019-05-13 DIAGNOSIS — M25522 Pain in left elbow: Secondary | ICD-10-CM | POA: Diagnosis not present

## 2019-05-13 DIAGNOSIS — G894 Chronic pain syndrome: Secondary | ICD-10-CM | POA: Diagnosis not present

## 2019-05-13 DIAGNOSIS — M25512 Pain in left shoulder: Secondary | ICD-10-CM | POA: Diagnosis not present

## 2019-05-15 DIAGNOSIS — M542 Cervicalgia: Secondary | ICD-10-CM | POA: Diagnosis not present

## 2019-05-15 DIAGNOSIS — M6281 Muscle weakness (generalized): Secondary | ICD-10-CM | POA: Diagnosis not present

## 2019-05-15 DIAGNOSIS — G8921 Chronic pain due to trauma: Secondary | ICD-10-CM | POA: Diagnosis not present

## 2019-05-15 DIAGNOSIS — M25522 Pain in left elbow: Secondary | ICD-10-CM | POA: Diagnosis not present

## 2019-05-15 DIAGNOSIS — M25512 Pain in left shoulder: Secondary | ICD-10-CM | POA: Diagnosis not present

## 2019-05-15 DIAGNOSIS — G8929 Other chronic pain: Secondary | ICD-10-CM | POA: Diagnosis not present

## 2019-05-15 DIAGNOSIS — M545 Low back pain: Secondary | ICD-10-CM | POA: Diagnosis not present

## 2019-05-15 DIAGNOSIS — M25562 Pain in left knee: Secondary | ICD-10-CM | POA: Diagnosis not present

## 2019-05-15 DIAGNOSIS — R2689 Other abnormalities of gait and mobility: Secondary | ICD-10-CM | POA: Diagnosis not present

## 2019-05-15 DIAGNOSIS — G894 Chronic pain syndrome: Secondary | ICD-10-CM | POA: Diagnosis not present

## 2019-05-15 NOTE — Progress Notes (Signed)
Reviewed and agree with documentation and assessment and plan. K. Veena Hiawatha Merriott , MD   

## 2019-05-20 DIAGNOSIS — M25522 Pain in left elbow: Secondary | ICD-10-CM | POA: Diagnosis not present

## 2019-05-20 DIAGNOSIS — M25512 Pain in left shoulder: Secondary | ICD-10-CM | POA: Diagnosis not present

## 2019-05-20 DIAGNOSIS — M545 Low back pain: Secondary | ICD-10-CM | POA: Diagnosis not present

## 2019-05-20 DIAGNOSIS — M6281 Muscle weakness (generalized): Secondary | ICD-10-CM | POA: Diagnosis not present

## 2019-05-20 DIAGNOSIS — G8929 Other chronic pain: Secondary | ICD-10-CM | POA: Diagnosis not present

## 2019-05-20 DIAGNOSIS — R2689 Other abnormalities of gait and mobility: Secondary | ICD-10-CM | POA: Diagnosis not present

## 2019-05-20 DIAGNOSIS — M25562 Pain in left knee: Secondary | ICD-10-CM | POA: Diagnosis not present

## 2019-05-20 DIAGNOSIS — G894 Chronic pain syndrome: Secondary | ICD-10-CM | POA: Diagnosis not present

## 2019-05-20 DIAGNOSIS — M542 Cervicalgia: Secondary | ICD-10-CM | POA: Diagnosis not present

## 2019-05-20 DIAGNOSIS — G8921 Chronic pain due to trauma: Secondary | ICD-10-CM | POA: Diagnosis not present

## 2019-05-23 DIAGNOSIS — M542 Cervicalgia: Secondary | ICD-10-CM | POA: Diagnosis not present

## 2019-05-23 DIAGNOSIS — G8921 Chronic pain due to trauma: Secondary | ICD-10-CM | POA: Diagnosis not present

## 2019-05-23 DIAGNOSIS — G8929 Other chronic pain: Secondary | ICD-10-CM | POA: Diagnosis not present

## 2019-05-23 DIAGNOSIS — M25562 Pain in left knee: Secondary | ICD-10-CM | POA: Diagnosis not present

## 2019-05-23 DIAGNOSIS — M6281 Muscle weakness (generalized): Secondary | ICD-10-CM | POA: Diagnosis not present

## 2019-05-23 DIAGNOSIS — M25512 Pain in left shoulder: Secondary | ICD-10-CM | POA: Diagnosis not present

## 2019-05-23 DIAGNOSIS — M545 Low back pain: Secondary | ICD-10-CM | POA: Diagnosis not present

## 2019-05-23 DIAGNOSIS — G894 Chronic pain syndrome: Secondary | ICD-10-CM | POA: Diagnosis not present

## 2019-05-23 DIAGNOSIS — M25522 Pain in left elbow: Secondary | ICD-10-CM | POA: Diagnosis not present

## 2019-05-23 DIAGNOSIS — R2689 Other abnormalities of gait and mobility: Secondary | ICD-10-CM | POA: Diagnosis not present

## 2019-05-26 ENCOUNTER — Telehealth: Payer: Self-pay | Admitting: Internal Medicine

## 2019-05-26 DIAGNOSIS — R2689 Other abnormalities of gait and mobility: Secondary | ICD-10-CM | POA: Diagnosis not present

## 2019-05-26 DIAGNOSIS — M25522 Pain in left elbow: Secondary | ICD-10-CM | POA: Diagnosis not present

## 2019-05-26 DIAGNOSIS — M542 Cervicalgia: Secondary | ICD-10-CM | POA: Diagnosis not present

## 2019-05-26 DIAGNOSIS — M6281 Muscle weakness (generalized): Secondary | ICD-10-CM | POA: Diagnosis not present

## 2019-05-26 DIAGNOSIS — G8921 Chronic pain due to trauma: Secondary | ICD-10-CM | POA: Diagnosis not present

## 2019-05-26 DIAGNOSIS — M25562 Pain in left knee: Secondary | ICD-10-CM | POA: Diagnosis not present

## 2019-05-26 DIAGNOSIS — G894 Chronic pain syndrome: Secondary | ICD-10-CM | POA: Diagnosis not present

## 2019-05-26 DIAGNOSIS — G8929 Other chronic pain: Secondary | ICD-10-CM | POA: Diagnosis not present

## 2019-05-26 DIAGNOSIS — M25512 Pain in left shoulder: Secondary | ICD-10-CM | POA: Diagnosis not present

## 2019-05-26 DIAGNOSIS — M545 Low back pain: Secondary | ICD-10-CM | POA: Diagnosis not present

## 2019-05-26 MED ORDER — HYDROCODONE-ACETAMINOPHEN 5-325 MG PO TABS: 1.0000 | ORAL_TABLET | Freq: Four times a day (QID) | ORAL | 0 refills | Status: DC | PRN

## 2019-05-26 NOTE — Telephone Encounter (Signed)
Sent!

## 2019-05-26 NOTE — Telephone Encounter (Signed)
Hydrocodone refill.   Last OV: 02/24/2019 Last Fill: 04/25/2019 #120 and 0RF UDS: 01/18/2018 Low risk

## 2019-05-26 NOTE — Telephone Encounter (Signed)
Copied from [place] [id number]. Topic: Quick Communication - Rx Refill/Question >> May 26, 2019  2:46 PM Yvette Rack wrote: Medication: HYDROcodone-acetaminophen (NORCO/VICODIN) 5-325 MG tablet  Has the patient contacted their pharmacy? no  Preferred Pharmacy (with phone number or street name): [id number] - [place], [address] [phone] (Phone)  [phone] (Fax)  Agent: Please be advised that RX refills may take up to 3 business days. We ask that you follow-up with your pharmacy.

## 2019-05-28 DIAGNOSIS — M25512 Pain in left shoulder: Secondary | ICD-10-CM | POA: Diagnosis not present

## 2019-05-28 DIAGNOSIS — G894 Chronic pain syndrome: Secondary | ICD-10-CM | POA: Diagnosis not present

## 2019-05-28 DIAGNOSIS — M25562 Pain in left knee: Secondary | ICD-10-CM | POA: Diagnosis not present

## 2019-05-28 DIAGNOSIS — G8929 Other chronic pain: Secondary | ICD-10-CM | POA: Diagnosis not present

## 2019-05-28 DIAGNOSIS — R2689 Other abnormalities of gait and mobility: Secondary | ICD-10-CM | POA: Diagnosis not present

## 2019-05-28 DIAGNOSIS — M542 Cervicalgia: Secondary | ICD-10-CM | POA: Diagnosis not present

## 2019-05-28 DIAGNOSIS — G8921 Chronic pain due to trauma: Secondary | ICD-10-CM | POA: Diagnosis not present

## 2019-05-28 DIAGNOSIS — M25522 Pain in left elbow: Secondary | ICD-10-CM | POA: Diagnosis not present

## 2019-05-28 DIAGNOSIS — M545 Low back pain: Secondary | ICD-10-CM | POA: Diagnosis not present

## 2019-05-28 DIAGNOSIS — M6281 Muscle weakness (generalized): Secondary | ICD-10-CM | POA: Diagnosis not present

## 2019-05-29 ENCOUNTER — Telehealth: Payer: Self-pay | Admitting: Orthopedic Surgery

## 2019-05-29 NOTE — Telephone Encounter (Signed)
Received voicemail message from [facility] with [facility] needing recent notes and detail order. Also need DWO signed and faxed back to them.  The number to contact Jolene is [phone]

## 2019-05-30 NOTE — Telephone Encounter (Signed)
Faxed received will hold for signature and fax back with most recent office visit notes.

## 2019-06-02 ENCOUNTER — Telehealth: Payer: Self-pay | Admitting: Orthopedic Surgery

## 2019-06-02 NOTE — Telephone Encounter (Signed)
[name] was called and informed that Dr Sharol Given is out of the office and forms with notes will be faxed after he can sign off on it. [name] understood. Will hold message until paperwork can be sign and faxed.

## 2019-06-02 NOTE — Telephone Encounter (Signed)
Joann from the [facility] left a voicemail message stating that she has called 2 times last week in regards to this patient and has not heard anything back.  She would like for someone to give her a call back at [phone].  Thank you.

## 2019-06-03 DIAGNOSIS — G8921 Chronic pain due to trauma: Secondary | ICD-10-CM | POA: Diagnosis not present

## 2019-06-03 DIAGNOSIS — R2689 Other abnormalities of gait and mobility: Secondary | ICD-10-CM | POA: Diagnosis not present

## 2019-06-03 DIAGNOSIS — M25562 Pain in left knee: Secondary | ICD-10-CM | POA: Diagnosis not present

## 2019-06-03 DIAGNOSIS — M542 Cervicalgia: Secondary | ICD-10-CM | POA: Diagnosis not present

## 2019-06-03 DIAGNOSIS — G8929 Other chronic pain: Secondary | ICD-10-CM | POA: Diagnosis not present

## 2019-06-03 DIAGNOSIS — M545 Low back pain: Secondary | ICD-10-CM | POA: Diagnosis not present

## 2019-06-03 DIAGNOSIS — M25512 Pain in left shoulder: Secondary | ICD-10-CM | POA: Diagnosis not present

## 2019-06-03 DIAGNOSIS — M6281 Muscle weakness (generalized): Secondary | ICD-10-CM | POA: Diagnosis not present

## 2019-06-03 DIAGNOSIS — G894 Chronic pain syndrome: Secondary | ICD-10-CM | POA: Diagnosis not present

## 2019-06-03 DIAGNOSIS — M25522 Pain in left elbow: Secondary | ICD-10-CM | POA: Diagnosis not present

## 2019-06-05 DIAGNOSIS — M25512 Pain in left shoulder: Secondary | ICD-10-CM | POA: Diagnosis not present

## 2019-06-05 DIAGNOSIS — G8929 Other chronic pain: Secondary | ICD-10-CM | POA: Diagnosis not present

## 2019-06-05 DIAGNOSIS — G894 Chronic pain syndrome: Secondary | ICD-10-CM | POA: Diagnosis not present

## 2019-06-05 DIAGNOSIS — M6281 Muscle weakness (generalized): Secondary | ICD-10-CM | POA: Diagnosis not present

## 2019-06-05 DIAGNOSIS — G8921 Chronic pain due to trauma: Secondary | ICD-10-CM | POA: Diagnosis not present

## 2019-06-05 DIAGNOSIS — M25562 Pain in left knee: Secondary | ICD-10-CM | POA: Diagnosis not present

## 2019-06-05 DIAGNOSIS — M542 Cervicalgia: Secondary | ICD-10-CM | POA: Diagnosis not present

## 2019-06-05 DIAGNOSIS — M545 Low back pain: Secondary | ICD-10-CM | POA: Diagnosis not present

## 2019-06-05 DIAGNOSIS — R2689 Other abnormalities of gait and mobility: Secondary | ICD-10-CM | POA: Diagnosis not present

## 2019-06-05 DIAGNOSIS — M25522 Pain in left elbow: Secondary | ICD-10-CM | POA: Diagnosis not present

## 2019-06-06 NOTE — Telephone Encounter (Signed)
Completed and faxed today

## 2019-06-09 ENCOUNTER — Telehealth: Payer: Self-pay | Admitting: Orthopedic Surgery

## 2019-06-09 NOTE — Telephone Encounter (Signed)
Last 2 ov notes faxed to [facility] [phone]. Callback [phone]

## 2019-06-10 DIAGNOSIS — G8921 Chronic pain due to trauma: Secondary | ICD-10-CM | POA: Diagnosis not present

## 2019-06-10 DIAGNOSIS — G8929 Other chronic pain: Secondary | ICD-10-CM | POA: Diagnosis not present

## 2019-06-10 DIAGNOSIS — G894 Chronic pain syndrome: Secondary | ICD-10-CM | POA: Diagnosis not present

## 2019-06-10 DIAGNOSIS — M542 Cervicalgia: Secondary | ICD-10-CM | POA: Diagnosis not present

## 2019-06-10 DIAGNOSIS — M25522 Pain in left elbow: Secondary | ICD-10-CM | POA: Diagnosis not present

## 2019-06-10 DIAGNOSIS — M545 Low back pain: Secondary | ICD-10-CM | POA: Diagnosis not present

## 2019-06-10 DIAGNOSIS — R2689 Other abnormalities of gait and mobility: Secondary | ICD-10-CM | POA: Diagnosis not present

## 2019-06-10 DIAGNOSIS — M25562 Pain in left knee: Secondary | ICD-10-CM | POA: Diagnosis not present

## 2019-06-10 DIAGNOSIS — M25512 Pain in left shoulder: Secondary | ICD-10-CM | POA: Diagnosis not present

## 2019-06-10 DIAGNOSIS — M6281 Muscle weakness (generalized): Secondary | ICD-10-CM | POA: Diagnosis not present

## 2019-06-12 ENCOUNTER — Telehealth: Payer: Self-pay | Admitting: Family Medicine

## 2019-06-12 DIAGNOSIS — M25512 Pain in left shoulder: Secondary | ICD-10-CM | POA: Diagnosis not present

## 2019-06-12 DIAGNOSIS — G894 Chronic pain syndrome: Secondary | ICD-10-CM | POA: Diagnosis not present

## 2019-06-12 DIAGNOSIS — G8921 Chronic pain due to trauma: Secondary | ICD-10-CM | POA: Diagnosis not present

## 2019-06-12 DIAGNOSIS — M542 Cervicalgia: Secondary | ICD-10-CM | POA: Diagnosis not present

## 2019-06-12 DIAGNOSIS — G8929 Other chronic pain: Secondary | ICD-10-CM | POA: Diagnosis not present

## 2019-06-12 DIAGNOSIS — M25522 Pain in left elbow: Secondary | ICD-10-CM | POA: Diagnosis not present

## 2019-06-12 DIAGNOSIS — R2689 Other abnormalities of gait and mobility: Secondary | ICD-10-CM | POA: Diagnosis not present

## 2019-06-12 DIAGNOSIS — M25562 Pain in left knee: Secondary | ICD-10-CM | POA: Diagnosis not present

## 2019-06-12 DIAGNOSIS — M6281 Muscle weakness (generalized): Secondary | ICD-10-CM | POA: Diagnosis not present

## 2019-06-12 DIAGNOSIS — M545 Low back pain: Secondary | ICD-10-CM | POA: Diagnosis not present

## 2019-06-12 NOTE — Telephone Encounter (Signed)
I called patient and LVM regarding rescheduling 9/14 appt due to NP time off. I requested patient call back to r/s.

## 2019-06-17 DIAGNOSIS — M545 Low back pain: Secondary | ICD-10-CM | POA: Diagnosis not present

## 2019-06-17 DIAGNOSIS — M542 Cervicalgia: Secondary | ICD-10-CM | POA: Diagnosis not present

## 2019-06-17 DIAGNOSIS — M6281 Muscle weakness (generalized): Secondary | ICD-10-CM | POA: Diagnosis not present

## 2019-06-17 DIAGNOSIS — M25562 Pain in left knee: Secondary | ICD-10-CM | POA: Diagnosis not present

## 2019-06-17 DIAGNOSIS — G8929 Other chronic pain: Secondary | ICD-10-CM | POA: Diagnosis not present

## 2019-06-17 DIAGNOSIS — M25512 Pain in left shoulder: Secondary | ICD-10-CM | POA: Diagnosis not present

## 2019-06-17 DIAGNOSIS — G8921 Chronic pain due to trauma: Secondary | ICD-10-CM | POA: Diagnosis not present

## 2019-06-17 DIAGNOSIS — G894 Chronic pain syndrome: Secondary | ICD-10-CM | POA: Diagnosis not present

## 2019-06-17 DIAGNOSIS — M25522 Pain in left elbow: Secondary | ICD-10-CM | POA: Diagnosis not present

## 2019-06-17 DIAGNOSIS — R2689 Other abnormalities of gait and mobility: Secondary | ICD-10-CM | POA: Diagnosis not present

## 2019-06-19 ENCOUNTER — Telehealth: Payer: Self-pay | Admitting: Internal Medicine

## 2019-06-19 DIAGNOSIS — M25562 Pain in left knee: Secondary | ICD-10-CM | POA: Diagnosis not present

## 2019-06-19 DIAGNOSIS — M25512 Pain in left shoulder: Secondary | ICD-10-CM | POA: Diagnosis not present

## 2019-06-19 DIAGNOSIS — G8921 Chronic pain due to trauma: Secondary | ICD-10-CM | POA: Diagnosis not present

## 2019-06-19 DIAGNOSIS — G8929 Other chronic pain: Secondary | ICD-10-CM | POA: Diagnosis not present

## 2019-06-19 DIAGNOSIS — M6281 Muscle weakness (generalized): Secondary | ICD-10-CM | POA: Diagnosis not present

## 2019-06-19 DIAGNOSIS — M545 Low back pain: Secondary | ICD-10-CM | POA: Diagnosis not present

## 2019-06-19 DIAGNOSIS — M25522 Pain in left elbow: Secondary | ICD-10-CM | POA: Diagnosis not present

## 2019-06-19 DIAGNOSIS — M542 Cervicalgia: Secondary | ICD-10-CM | POA: Diagnosis not present

## 2019-06-19 DIAGNOSIS — R2689 Other abnormalities of gait and mobility: Secondary | ICD-10-CM | POA: Diagnosis not present

## 2019-06-19 DIAGNOSIS — G894 Chronic pain syndrome: Secondary | ICD-10-CM | POA: Diagnosis not present

## 2019-06-19 MED ORDER — HYDROCODONE-ACETAMINOPHEN 5-325 MG PO TABS: 1.0000 | ORAL_TABLET | Freq: Four times a day (QID) | ORAL | 0 refills | Status: DC | PRN

## 2019-06-19 NOTE — Telephone Encounter (Signed)
Slightly early but prescription sent

## 2019-06-19 NOTE — Telephone Encounter (Signed)
Hydrocodone refill.   Last OV: 02/24/2019  Last Fill: 05/26/2019 #120 and 0RF UDS: 01/18/2018 Low risk

## 2019-06-19 NOTE — Telephone Encounter (Signed)
Medication Refill - Medication: HYDROcodone-acetaminophen (NORCO/VICODIN) 5-325 MG tablet    Has the patient contacted their pharmacy? Yes.   (Agent: If no, request that the patient contact the pharmacy for the refill.) (Agent: If yes, when and what did the pharmacy advise?)  Preferred Pharmacy (with phone number or street name):  [facility] [place], [address] [place] [address]  Phone: [phone] Fax: [phone]  Not a 24 hour pharmacy; exact hours not known.     Agent: Please be advised that RX refills may take up to 3 business days. We ask that you follow-up with your pharmacy.

## 2019-06-19 NOTE — Telephone Encounter (Signed)
Requested medication (s) are due for refill today: yes  Requested medication (s) are on the active medication list: yes  Last refill:  05/26/2019  Future visit scheduled: yes  Notes to clinic: This refill cannot be delegated   Requested Prescriptions  Pending Prescriptions Disp Refills   HYDROcodone-acetaminophen (NORCO/VICODIN) 5-325 MG tablet 120 tablet 0    Sig: Take 1 tablet by mouth every 6 (six) hours as needed for moderate pain.     Not Delegated - Analgesics:  Opioid Agonist Combinations Failed - 06/19/2019  2:50 PM      Failed - This refill cannot be delegated      Failed - Urine Drug Screen completed in last 360 days.      Passed - Valid encounter within last 6 months    Recent Outpatient Visits          3 months ago Essential hypertension   [facility] [place], MD   10 months ago Essential hypertension   [facility] [place], MD   1 year ago Essential hypertension   [facility] [place], MD   1 year ago Essential hypertension   [facility] [place], MD   1 year ago Urinary tract infection without hematuria, site unspecified   [facility] [place], MD      Future Appointments            In 3 weeks Colon Branch, MD [facility] [facility] [place]

## 2019-06-24 DIAGNOSIS — M25522 Pain in left elbow: Secondary | ICD-10-CM | POA: Diagnosis not present

## 2019-06-24 DIAGNOSIS — G8921 Chronic pain due to trauma: Secondary | ICD-10-CM | POA: Diagnosis not present

## 2019-06-24 DIAGNOSIS — M545 Low back pain: Secondary | ICD-10-CM | POA: Diagnosis not present

## 2019-06-24 DIAGNOSIS — G894 Chronic pain syndrome: Secondary | ICD-10-CM | POA: Diagnosis not present

## 2019-06-24 DIAGNOSIS — R2689 Other abnormalities of gait and mobility: Secondary | ICD-10-CM | POA: Diagnosis not present

## 2019-06-24 DIAGNOSIS — M25512 Pain in left shoulder: Secondary | ICD-10-CM | POA: Diagnosis not present

## 2019-06-24 DIAGNOSIS — M6281 Muscle weakness (generalized): Secondary | ICD-10-CM | POA: Diagnosis not present

## 2019-06-24 DIAGNOSIS — M25562 Pain in left knee: Secondary | ICD-10-CM | POA: Diagnosis not present

## 2019-06-24 DIAGNOSIS — G8929 Other chronic pain: Secondary | ICD-10-CM | POA: Diagnosis not present

## 2019-06-24 DIAGNOSIS — M542 Cervicalgia: Secondary | ICD-10-CM | POA: Diagnosis not present

## 2019-06-26 DIAGNOSIS — R2689 Other abnormalities of gait and mobility: Secondary | ICD-10-CM | POA: Diagnosis not present

## 2019-06-26 DIAGNOSIS — M545 Low back pain: Secondary | ICD-10-CM | POA: Diagnosis not present

## 2019-06-26 DIAGNOSIS — G8929 Other chronic pain: Secondary | ICD-10-CM | POA: Diagnosis not present

## 2019-06-26 DIAGNOSIS — M542 Cervicalgia: Secondary | ICD-10-CM | POA: Diagnosis not present

## 2019-06-26 DIAGNOSIS — G8921 Chronic pain due to trauma: Secondary | ICD-10-CM | POA: Diagnosis not present

## 2019-06-26 DIAGNOSIS — M25522 Pain in left elbow: Secondary | ICD-10-CM | POA: Diagnosis not present

## 2019-06-26 DIAGNOSIS — M25512 Pain in left shoulder: Secondary | ICD-10-CM | POA: Diagnosis not present

## 2019-06-26 DIAGNOSIS — M25562 Pain in left knee: Secondary | ICD-10-CM | POA: Diagnosis not present

## 2019-06-26 DIAGNOSIS — G894 Chronic pain syndrome: Secondary | ICD-10-CM | POA: Diagnosis not present

## 2019-06-26 DIAGNOSIS — M6281 Muscle weakness (generalized): Secondary | ICD-10-CM | POA: Diagnosis not present

## 2019-07-01 ENCOUNTER — Telehealth: Payer: Self-pay | Admitting: Family Medicine

## 2019-07-01 DIAGNOSIS — R110 Nausea: Secondary | ICD-10-CM

## 2019-07-01 MED ORDER — ONDANSETRON 4 MG PO TBDP: ORAL_TABLET | ORAL | 0 refills | Status: DC

## 2019-07-01 NOTE — Telephone Encounter (Signed)
Pt is asking if she can get a refill on her ondansetron (ZOFRAN-ODT) 4 MG disintegrating tablet, please call into [phone]

## 2019-07-01 NOTE — Addendum Note (Signed)
Addended by: Brandon Melnick on: 07/01/2019 03:42 PM   Modules accepted: Orders

## 2019-07-04 DIAGNOSIS — G894 Chronic pain syndrome: Secondary | ICD-10-CM | POA: Diagnosis not present

## 2019-07-04 DIAGNOSIS — M542 Cervicalgia: Secondary | ICD-10-CM | POA: Diagnosis not present

## 2019-07-04 DIAGNOSIS — M25562 Pain in left knee: Secondary | ICD-10-CM | POA: Diagnosis not present

## 2019-07-04 DIAGNOSIS — M25522 Pain in left elbow: Secondary | ICD-10-CM | POA: Diagnosis not present

## 2019-07-04 DIAGNOSIS — M545 Low back pain: Secondary | ICD-10-CM | POA: Diagnosis not present

## 2019-07-04 DIAGNOSIS — R2689 Other abnormalities of gait and mobility: Secondary | ICD-10-CM | POA: Diagnosis not present

## 2019-07-04 DIAGNOSIS — G8929 Other chronic pain: Secondary | ICD-10-CM | POA: Diagnosis not present

## 2019-07-04 DIAGNOSIS — M25512 Pain in left shoulder: Secondary | ICD-10-CM | POA: Diagnosis not present

## 2019-07-04 DIAGNOSIS — G8921 Chronic pain due to trauma: Secondary | ICD-10-CM | POA: Diagnosis not present

## 2019-07-04 DIAGNOSIS — M6281 Muscle weakness (generalized): Secondary | ICD-10-CM | POA: Diagnosis not present

## 2019-07-07 ENCOUNTER — Ambulatory Visit: Payer: Medicare Other | Admitting: Family Medicine

## 2019-07-07 DIAGNOSIS — M25562 Pain in left knee: Secondary | ICD-10-CM | POA: Diagnosis not present

## 2019-07-07 DIAGNOSIS — R2689 Other abnormalities of gait and mobility: Secondary | ICD-10-CM | POA: Diagnosis not present

## 2019-07-07 DIAGNOSIS — M25522 Pain in left elbow: Secondary | ICD-10-CM | POA: Diagnosis not present

## 2019-07-07 DIAGNOSIS — G894 Chronic pain syndrome: Secondary | ICD-10-CM | POA: Diagnosis not present

## 2019-07-07 DIAGNOSIS — M6281 Muscle weakness (generalized): Secondary | ICD-10-CM | POA: Diagnosis not present

## 2019-07-07 DIAGNOSIS — M25512 Pain in left shoulder: Secondary | ICD-10-CM | POA: Diagnosis not present

## 2019-07-07 DIAGNOSIS — G8921 Chronic pain due to trauma: Secondary | ICD-10-CM | POA: Diagnosis not present

## 2019-07-07 DIAGNOSIS — G8929 Other chronic pain: Secondary | ICD-10-CM | POA: Diagnosis not present

## 2019-07-07 DIAGNOSIS — M545 Low back pain: Secondary | ICD-10-CM | POA: Diagnosis not present

## 2019-07-07 DIAGNOSIS — M542 Cervicalgia: Secondary | ICD-10-CM | POA: Diagnosis not present

## 2019-07-08 ENCOUNTER — Ambulatory Visit: Payer: Medicare Other | Admitting: Internal Medicine

## 2019-07-09 DIAGNOSIS — E119 Type 2 diabetes mellitus without complications: Secondary | ICD-10-CM | POA: Diagnosis not present

## 2019-07-09 DIAGNOSIS — E782 Mixed hyperlipidemia: Secondary | ICD-10-CM | POA: Diagnosis not present

## 2019-07-09 DIAGNOSIS — I10 Essential (primary) hypertension: Secondary | ICD-10-CM | POA: Diagnosis not present

## 2019-07-09 DIAGNOSIS — I25728 Atherosclerosis of autologous artery coronary artery bypass graft(s) with other forms of angina pectoris: Secondary | ICD-10-CM | POA: Diagnosis not present

## 2019-07-10 ENCOUNTER — Other Ambulatory Visit: Payer: Self-pay

## 2019-07-10 ENCOUNTER — Ambulatory Visit (INDEPENDENT_AMBULATORY_CARE_PROVIDER_SITE_OTHER): Payer: Medicare Other | Admitting: Family Medicine

## 2019-07-10 ENCOUNTER — Encounter: Payer: Self-pay | Admitting: Family Medicine

## 2019-07-10 VITALS — BP 146/80 | HR 80 | Temp 97.7°F | Ht 64.0 in

## 2019-07-10 DIAGNOSIS — G43009 Migraine without aura, not intractable, without status migrainosus: Secondary | ICD-10-CM | POA: Diagnosis not present

## 2019-07-10 NOTE — Patient Instructions (Addendum)
Continue amitriptyline 25mg  at bedtime  Please monitor BP at home  Continue working with PT as directed  May follow up with PCP for refills, follow up with [place] as needed   Analgesic Rebound Headache An analgesic rebound headache, sometimes called a medication overuse headache, is a headache that comes after pain medicine (analgesic) taken to treat the original (primary) headache has worn off. Any type of primary headache can return as a rebound headache if a person regularly takes analgesics more than three times a week to treat it. The types of primary headaches that are commonly associated with rebound headaches include:  Migraines.  Headaches that arise from tense muscles in the head and neck area (tension headaches).  Headaches that develop and happen again (recur) on one side of the head and around the eye (cluster headaches). If rebound headaches continue, they become chronic daily headaches. What are the causes? This condition may be caused by frequent use of:  Over-the-counter medicines such as aspirin, ibuprofen, and acetaminophen.  Sinus relief medicines and other medicines that contain caffeine.  Narcotic pain medicines such as codeine and oxycodone. What are the signs or symptoms? The symptoms of a rebound headache are the same as the symptoms of the original headache. Some of the symptoms of specific types of headaches include: Migraine headache  Pulsing or throbbing pain on one or both sides of the head.  Severe pain that interferes with daily activities.  Pain that is worsened by physical activity.  Nausea, vomiting, or both.  Pain with exposure to bright light, loud noises, or strong smells.  General sensitivity to bright light, loud noises, or strong smells.  Visual changes.  Numbness of one or both arms. Tension headache  Pressure around the head.  Dull, aching head pain.  Pain felt over the front and sides of the head.  Tenderness in the  muscles of the head, neck, and shoulders. Cluster headache  Severe pain that begins in or around one eye or temple.  Redness and tearing in the eye on the same side as the pain.  Droopy or swollen eyelid.  One-sided head pain.  Nausea.  Runny nose.  Sweaty, pale facial skin.  Restlessness. How is this diagnosed? This condition is diagnosed by:  Reviewing your medical history. This includes the nature of your primary headaches.  Reviewing the types of pain medicines that you have been using to treat your headaches and how often you take them. How is this treated? This condition may be treated or managed by:  Discontinuing frequent use of the analgesic medicine. Doing this may worsen your headaches at first, but the pain should eventually become more manageable, less frequent, and less severe.  Seeing a headache specialist. He or she may be able to help you manage your headaches and help make sure there is not another cause of the headaches.  Using methods of stress relief, such as acupuncture, counseling, biofeedback, and massage. Talk with your health care provider about which methods might be good for you. Follow these instructions at home:  Take over-the-counter and prescription medicines only as told by your health care provider.  Stop the repeated use of pain medicine as told by your health care provider. Stopping can be difficult. Carefully follow instructions from your health care provider.  Avoid triggers that are known to cause your primary headaches.  Keep all follow-up visits as told by your health care provider. This is important. Contact a health care provider if:  You continue to  experience headaches after following treatments that your health care provider recommended. Get help right away if:  You develop new headache pain.  You develop headache pain that is different than what you have experienced in the past.  You develop numbness or tingling in your  arms or legs.  You develop changes in your speech or vision. This information is not intended to replace advice given to you by your health care provider. Make sure you discuss any questions you have with your health care provider. Document Released: 12/30/2003 Document Revised: 09/21/2017 Document Reviewed: 03/13/2016 Elsevier Patient Education  2020 [employer]. Migraine Headache A migraine headache is a very strong throbbing pain on one side or both sides of your head. This type of headache can also cause other symptoms. It can last from 4 hours to 3 days. Talk with your doctor about what things may bring on (trigger) this condition. What are the causes? The exact cause of this condition is not known. This condition may be triggered or caused by:  Drinking alcohol.  Smoking.  Taking medicines, such as: ? Medicine used to treat chest pain (nitroglycerin). ? Birth control pills. ? Estrogen. ? Some blood pressure medicines.  Eating or drinking certain products.  Doing physical activity. Other things that may trigger a migraine headache include:  Having a menstrual period.  Pregnancy.  Hunger.  Stress.  Not getting enough sleep or getting too much sleep.  Weather changes.  Tiredness (fatigue). What increases the risk?  Being 40-41 years old.  Being female.  Having a family history of migraine headaches.  Being Caucasian.  Having depression or anxiety.  Being very overweight. What are the signs or symptoms?  A throbbing pain. This pain may: ? Happen in any area of the head, such as on one side or both sides. ? Make it hard to do daily activities. ? Get worse with physical activity. ? Get worse around bright lights or loud noises.  Other symptoms may include: ? Feeling sick to your stomach (nauseous). ? Vomiting. ? Dizziness. ? Being sensitive to bright lights, loud noises, or smells.  Before you get a migraine headache, you may get warning signs (an  aura). An aura may include: ? Seeing flashing lights or having blind spots. ? Seeing bright spots, halos, or zigzag lines. ? Having tunnel vision or blurred vision. ? Having numbness or a tingling feeling. ? Having trouble talking. ? Having weak muscles.  Some people have symptoms after a migraine headache (postdromal phase), such as: ? Tiredness. ? Trouble thinking (concentrating). How is this treated?  Taking medicines that: ? Relieve pain. ? Relieve the feeling of being sick to your stomach. ? Prevent migraine headaches.  Treatment may also include: ? Having acupuncture. ? Avoiding foods that bring on migraine headaches. ? Learning ways to control your body functions (biofeedback). ? Therapy to help you know and deal with negative thoughts (cognitive behavioral therapy). Follow these instructions at home: Medicines  Take over-the-counter and prescription medicines only as told by your doctor.  Ask your doctor if the medicine prescribed to you: ? Requires you to avoid driving or using heavy machinery. ? Can cause trouble pooping (constipation). You may need to take these steps to prevent or treat trouble pooping:  Drink enough fluid to keep your pee (urine) pale yellow.  Take over-the-counter or prescription medicines.  Eat foods that are high in fiber. These include beans, whole grains, and fresh fruits and vegetables.  Limit foods that are high in  fat and sugar. These include fried or sweet foods. Lifestyle  Do not drink alcohol.  Do not use any products that contain nicotine or tobacco, such as cigarettes, e-cigarettes, and chewing tobacco. If you need help quitting, ask your doctor.  Get at least 8 hours of sleep every night.  Limit and deal with stress. General instructions      Keep a journal to find out what may bring on your migraine headaches. For example, write down: ? What you eat and drink. ? How much sleep you get. ? Any change in what you eat or  drink. ? Any change in your medicines.  If you have a migraine headache: ? Avoid things that make your symptoms worse, such as bright lights. ? It may help to lie down in a dark, quiet room. ? Do not drive or use heavy machinery. ? Ask your doctor what activities are safe for you.  Keep all follow-up visits as told by your doctor. This is important. Contact a doctor if:  You get a migraine headache that is different or worse than others you have had.  You have more than 15 headache days in one month. Get help right away if:  Your migraine headache gets very bad.  Your migraine headache lasts longer than 72 hours.  You have a fever.  You have a stiff neck.  You have trouble seeing.  Your muscles feel weak or like you cannot control them.  You start to lose your balance a lot.  You start to have trouble walking.  You pass out (faint).  You have a seizure. Summary  A migraine headache is a very strong throbbing pain on one side or both sides of your head. These headaches can also cause other symptoms.  This condition may be treated with medicines and changes to your lifestyle.  Keep a journal to find out what may bring on your migraine headaches.  Contact a doctor if you get a migraine headache that is different or worse than others you have had.  Contact your doctor if you have more than 15 headache days in a month. This information is not intended to replace advice given to you by your health care provider. Make sure you discuss any questions you have with your health care provider. Document Released: 07/18/2008 Document Revised: 01/31/2019 Document Reviewed: 11/21/2018 Elsevier Patient Education  2020 [employer].

## 2019-07-10 NOTE — Progress Notes (Addendum)
PATIENT: [name] DOB: 20-Jun-1935  REASON FOR VISIT: follow up HISTORY FROM: patient  Chief Complaint  Patient presents with  . Follow-up    6 mon f/u. Alone. Rm 6. No recent migraines. No new concerns at this time.      HISTORY OF PRESENT ILLNESS: Today 07/10/19 [name] is a 84 y.o. female here today for follow up for migraines. We increased amitriptyline to 43m at bedtime. She reports that this has significantly helped frequency and intensity of migraines. She can't remember the last migraine she had. She does continue to have mild headaches regularly but feels they are easily treated with Tylenol. She does take 1-2 tablet of Tylenol 2-3 times a week on average. She also takes hydrocodone-acetaminophen as prescribed for chronic pain. She has regular follow up with PCP for labs and chronic disease management. She is participating in PT for concerns of imbalance thought to be related to neuropathy.   HISTORY: (copied from my note on 01/02/2019)  Mary SALATINOis a 84y.o. female here today for follow up for dizziness. She has not noticed any dizziness in the past year. She reports that dizziness "just went away." She is having more headaches than usual. She reports a history of migraines. She states that these headaches feel the same. She noticed about a month ago they returned. She is having right sided pounding. She does have nausea with headaches. She is not sensitive to light or sounds. She has been taking Tylenol 10060mat least once daily. She is taking amitriptyline nightly for sleep. She is also having trouble with restless legs. She just started Mirapex 0.2541mt night. She is allergic to gabapentin, Cymbalta and Lyrica (causes leg swelling). She is anticoagulated with Xarelto post MI. She has extensive PVD and CAD.   HISTORY: (copied from Dr AheCathren Lainete on 11/13/2017) Interval history 11/13/2016:She returns for dizziness likely BPPV. I have recommended vestibular  therapy but she has not gone. She has had open heart surgery since we last saw her. She is living with her son. She was feeling chest pain and [place] some headache pain on the right occipital area and into the neck on the right into the muscular area which is new and maybe she slept on it wrong. Otherwise headaches resolved. She says she has not had any dizziness since last appointment. Dr. CroErnesto RutherfordNT,evaluated her which was normal but did say likely BPPV.she takes meclizine and zofran when she gets into he car but she is much improved. She has been shooting pain into her legs every once in a while which isnot significant.    HISTORY OF PRESENT ILLNESS: Mary. [name] Going a 81 8ar old female with a history of dizziness. She returns today for follow-up. She states that her dizziness has improved. She did have an appointment with Dr. CroClaria Diced reports that there exam was unremarkable. She states that she takes the Zofran and meclizine before getting in the car. She states that [place] able [place] having an episode of dizziness. She states approximately 2 weeks ago she felt as if the dizziness will start but then it resolved. The patient did have a below the knee amputation on the right in April. She is in the process of getting a prosthesis and learning to walk. [place] until she can ambulate independently. She returns today for  an evaluation.  Addendum; Saw Dr. Ernesto Rutherford, no otologic issues, likely BPPV. 03/2016  03/15/16 (ahern): She is having episodes of getting very sick. She starts having motion sickness. Lots of nausea. She has vertigo. Feels like her head is spinning. Worse when she moves her head. She gets the vertigo when she moves her head. She feels like she has to sit very still and not move her head or the vertigo will come back. This is not  something new. It has become more severe. These episodes have been going on for at least a year and she also had in the past. It happens in the afternoons. Sometimes after she eats. She has decreased hearing. Has not seen anyone fore   HPI:[name] is a 84 y.o. female here as a referral from Dr. Larose Kells for headaches., PMHx spinal stenosis, HTN, neuropathy, CAD, DM2, arthritis, RSD, fatty liver. Patient fell on her steps while helping with groceries and she was flat on her back. She uses a walker and wasn't using it, helping lift groceries. Her head hit the bricks. She did not go to the hospital. This happened mid December. She followed up on her back and neck and all was good. She hasn't felt right since then, a few weeks ago she was in the bathroom and all of a sudden she saw lights and stars blinking on the right side, went on for 30 minutes. It has happened a few times, happened less since them. She saw Dr. Bing Plume and they could not see her so she went to Dr. Katy Fitch. She has a history of migraines when she was a teenager and lasted into her thirties. Sister with very bad migraines. After the lights stop she gets a headache. No double vision, no blurry vision, but she does see visual abnormalities like something running across her vision, she is having more headaches. She is also having dizziness. She has been more irritable since hitting her head, worsening memory problems. She is having blurry vision. She takes hydrocodone daily. She takes advil sometimes not too often. They don't last that long, not that severe 5/10 for 20 minutes.   Reviewed notes, labs and imaging from outside physicians, which showed; She was seen by Dr Katy Fitch for an emergency visit. Her head had been hurting since a fall. She had a "light show" in her right eye with some vision changes. VA was od 20/30, os 20/25, external exam normal, slit lamp nml, fundus nml, glaucoma borderline. He suspected migraine aura. Visual fields were  performed. .  crp 4.2, esr 2.    REVIEW OF SYSTEMS: Out of a complete 14 system review of symptoms, the patient complains only of the following symptoms, memory loss, headaches, speech difficulty, weakness and all other reviewed systems are negative.   ALLERGIES: Allergies  Allergen Reactions  . Cymbalta [Duloxetine Hcl] Swelling    Swelling in legs  . Gabapentin Swelling    Swelling in legs Swelling in legs  . Silicone Hives, Itching, Dermatitis and Rash  . Metformin And Related Other (See Comments)    dizzy, tired, chills, diarrhea, and nausea  . Adhesive [Tape] Rash    rash rash  . Cefazolin Hives    Hives Hives   . Ciprofloxacin Other (See Comments)    Other reaction(s): Other (See Comments) Per pt, caused body aches Per pt, caused body aches   . Clorazepate Dipotassium Other (See Comments)    Unknown reaction  . Dilaudid [Hydromorphone Hcl] Other (See Comments)  confused, intense itching  . Doxycycline Rash  . Enablex [Darifenacin Hydrobromide Er] Other (See Comments)    Hypotension, near syncope  . Levofloxacin Other (See Comments)    Causes wrist pain  . Lyrica [Pregabalin] Swelling    Swelling in legs  . Methadone Hcl Other (See Comments)    Reaction unknown  . Morphine And Related Other (See Comments)    Confusion, constipation.   [place] [Pentazocine] Other (See Comments)    "climbing walls" anxiety    HOME MEDICATIONS: Outpatient Medications Prior to Visit  Medication Sig Dispense Refill  . Alpha-Lipoic Acid 600 MG CAPS Take 600 mg by mouth 2 (two) times daily.    Marland Kitchen amitriptyline (ELAVIL) 25 MG tablet Take 1 tablet (25 mg total) by mouth at bedtime. 90 tablet 3  . atorvastatin (LIPITOR) 40 MG tablet Take 40 mg by mouth daily.    . carvedilol (COREG) 25 MG tablet Take 25 mg by mouth 2 (two) times daily with a meal.    . Cholecalciferol (VITAMIN D3) 5000 UNITS CAPS Take 5,000 Units by mouth every evening. Reported on 11/24/2015    . clopidogrel  (PLAVIX) 75 MG tablet Take 75 mg by mouth at bedtime.     . Cyanocobalamin (VITAMIN B-12) 500 MCG SUBL Place 500 mcg under the tongue daily.    . diclofenac sodium (VOLTAREN) 1 % GEL APPLY 2-4 GRAMS TO AFFECTED AREA 3 TIMES DAILY AS NEEDED 500 g 5  . ferrous sulfate (FEROSUL) 325 (65 FE) MG tablet Take 1 tablet (325 mg total) by mouth 2 (two) times daily with a meal. 180 tablet 1  . FREESTYLE LITE test strip USE TO CHECK BLOOD SUGAR NO MORE THAN TWICE DAILY 200 each 12  . furosemide (LASIX) 20 MG tablet Take 20 mg by mouth daily.    Marland Kitchen HYDROcodone-acetaminophen (NORCO/VICODIN) 5-325 MG tablet Take 1 tablet by mouth every 6 (six) hours as needed for moderate pain. 120 tablet 0  . hydrocortisone (ANUSOL-HC) 2.5 % rectal cream Place 1 application rectally 2 (two) times daily. 30 g 0  . hydrocortisone (ANUSOL-HC) 25 MG suppository Place 1 suppository (25 mg total) rectally every 12 (twelve) hours. 30 suppository 1  . Lancets (FREESTYLE) lancets CHECK BLOOD SUGAR TWICE DAILY 200 each 12  . lidocaine (LIDODERM) 5 % Place 1 patch onto the skin daily. Remove & Discard patch within 12 hours or as directed by MD 30 patch 3  . meclizine (ANTIVERT) 25 MG tablet Take 1 tablet (25 mg total) by mouth 2 (two) times daily as needed for dizziness. 180 tablet 1  . methenamine (HIPREX) 1 g tablet Take 1 tablet by mouth 2 (two) times daily.  4  . Multiple Vitamins-Minerals (PRESERVISION AREDS 2) CAPS Take 1 capsule by mouth 2 (two) times daily.    . nitroGLYCERIN (NITROSTAT) 0.4 MG SL tablet Place 0.4 mg under the tongue every 5 (five) minutes as needed for chest pain (x 3 doses). Reported on 04/14/2016    . omeprazole (PRILOSEC) 20 MG capsule Take 1 capsule (20 mg total) by mouth 2 (two) times daily. 180 capsule 3  . ondansetron (ZOFRAN-ODT) 4 MG disintegrating tablet TAKE 1 TABLET BY MOUTH EVERY 8 HOURS AS NEEDED FOR NAUSEA OR VOMITING 60 tablet 0  . OVER THE COUNTER MEDICATION Take 1 capsule by mouth 2 (two) times  daily. Integrative Digestive Formula    . OVER THE COUNTER MEDICATION 1 tablet 3 (three) times daily. Berberorine Gluco Defense     . Polyvinyl  Alcohol-Povidone (REFRESH OP) Place 1 drop into both eyes 3 (three) times daily as needed (dry eyes).     . pramipexole (MIRAPEX) 0.125 MG tablet Take 1-2 tablets (0.125-0.25 mg total) by mouth at bedtime as needed. 180 tablet 1  . rivaroxaban (XARELTO) 2.5 MG TABS tablet Take 2.5 mg by mouth 2 (two) times daily.    . nitrofurantoin, macrocrystal-monohydrate, (MACROBID) 100 MG capsule Take 100 mg by mouth 2 (two) times daily.     No facility-administered medications prior to visit.     PAST MEDICAL HISTORY: Past Medical History:  Diagnosis Date  . Abnormal echocardiogram    09/2013: mild LVH, mild focal basal hypertrophy of septum, EF 60-65%, normal WM, grade 1 diastolic dysfunction, MAC, mild LAE, ASA, PASP 34. Possible oscillating MV density - reviewed by MD and felt there was no significant abnormality other than MAC noted with mitral valve and did not require TEE.  . Adenomatous colon polyp   . Allergic rhinitis   . Arthritis    "back; ankles; hands; knees" (10/31/2013)  . Bilateral sensorineural hearing loss   . CAD (coronary artery disease)    a. Nonobst in 2011. b. Abnormal nuc 09/2013 -> s/p cutting balloon to D2, mild LAD disease; c. 08/2015 MV: no ischemia, EF 71%.  . Cancer ([place])    cancerous polyps  . Carcinoma in situ in a polyp 1994   a. 1994 - malignant polyp removed during colonoscopy.  . Charcot's Foot    a.  01/2016 s/p RLE transtibial amputation 2/2 Charcot rocker-bottom deformity and insensate neuropathy ulceration.  . Chronic diastolic CHF (congestive heart failure) ([place])    a. 09/2013 Echo: EF 60-65%, mild LVH, Gr1 DD.  Marland Kitchen Diverticulosis   . DJD (degenerative joint disease)   . DVT (deep venous thrombosis) ([place]) 12/2007  . Dyspnea    a. Chronic, extensive w/u see OV note 09-2010. b. [place] 2011: ormal with RA 5 RV 31/2 PA 27/12  (19) PCW 10 CO normal. No evidence of shunting with sitting up in cath lab. c. CPX 2011: see report. d. Prior fluoro of diaphragm -  R diaphragm elevated at rest but both moved with inspiration.   . Fatty liver   . GERD (gastroesophageal reflux disease)    a. Hx GERD/esophageal dysmotility followed by Dr. Olevia Perches.   Marland Kitchen Head injury due to trauma   . Headache    Optic migraine  . Hemorrhoids   . Hiatal hernia    a. s/p Nissen fundoplication 5170.  Marland Kitchen Hyperlipidemia    a. patient unwilling to use statins.  . Hypertensive heart disease   . IBS (irritable bowel syndrome)   . Macular degeneration 03/2009   Dr. Rosana Hoes  . Myocardial infarction ([place]) 2017  . Neuropathy    a. Hands, feet, legs.  . Orthostatic hypotension   . Osteomyelitis ([place])    a. 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 01/2016 s/p RLE transtibial amputation 2/2 Charcot rocker-bottom deformity and insensate neuropathy ulceration.  . Osteoporosis   . PE (pulmonary embolism) 12/2007   a. PE/DVT after neck surgery 2009. b. coumadin d/c 10-2008.  Marland Kitchen Pneumonia 2015ish  . PONV (postoperative nausea and vomiting) 2009   neck surgery  . PPD positive   . Pulmonary nodule    incidental per CT:  Pet scan 4-9: likely benign, CT 08-2009 no change, no further CTs (Dr. Gwenette Greet)  . Recurrent UTI   . RSD (reflex sympathetic dystrophy)  a. Chronic pain.  Marland Kitchen Spinal stenosis   . Type II diabetes mellitus ([facility])    no on medication  . Venous insufficiency    a. Contributing to LEE.    PAST SURGICAL HISTORY: Past Surgical History:  Procedure Laterality Date  . AMPUTATION  03/06/2012   Procedure: AMPUTATION FOOT;  Surgeon: Newt Minion, MD;  Location: [facility];  Service: Orthopedics;  Laterality: Left;  FIFTH RAY AMPUTATION   . AMPUTATION Right 02/18/2016   Procedure: AMPUTATION BELOW KNEE;  Surgeon: Newt Minion, MD;  Location: [place];  Service: Orthopedics;  Laterality: Right;  .  AMPUTATION Left 11/22/2016   Procedure: Amputation 4th Toe Left Foot at Metatarsophalangeal Joint;  Surgeon: Newt Minion, MD;  Location: [facility];  Service: Orthopedics;  Laterality: Left;  . ANKLE FUSION  09/27/2012   Procedure: ANKLE FUSION;  Surgeon: Newt Minion, MD;  Location: [facility];  Service: Orthopedics;  Laterality: Left;  Left Tibiocalcaneal Fusion  . ANKLE FUSION Right 05/09/2013   Procedure: ANKLE FUSION;  Surgeon: Newt Minion, MD;  Location: [facility];  Service: Orthopedics;  Laterality: Right;  Excision Osteomyelitis Base 1st MT Right Foot, Fusion Medial Column  . ANTERIOR CERVICAL DECOMP/DISCECTOMY FUSION  12/18/07   For OA,  Dr. Lorin Mercy:  fu by a PE  . CARDIAC CATHETERIZATION  05/2010    at [facility]  . CARPAL TUNNEL RELEASE Left 09/2011  . CATARACT EXTRACTION W/ INTRAOCULAR LENS  IMPLANT, BILATERAL  2004   feb 2004 left, aug 2004 right  . CHOLECYSTECTOMY  03/2002  . CORONARY ANGIOPLASTY  10/31/2013  . CORONARY ARTERY BYPASS GRAFT  09/27/2016  . CYST REMOVAL HAND  06/2003  . FOOT BONE EXCISION Right 06/2009  . LAPAROSCOPIC RIGHT HEMI COLECTOMY N/A 01/08/2015   Procedure: LAPAROSCOPIC ASSISTED RIGHT HEMI COLECTOMY;  Surgeon: Johnathan Hausen, MD;  Location: [facility];  Service: General;  Laterality: N/A;  . LEFT HEART CATHETERIZATION WITH CORONARY ANGIOGRAM N/A 10/31/2013   Procedure: LEFT HEART CATHETERIZATION WITH CORONARY ANGIOGRAM;  Surgeon: Jettie Booze, MD;  Location: [facility] CATH LAB;  Service: Cardiovascular;  Laterality: N/A;  . NASAL SEPTUM SURGERY  09/1963  . NISSEN FUNDOPLICATION  04/25/2594  . PERCUTANEOUS CORONARY INTERVENTION-BALLOON ONLY  10/31/2013   Procedure: PERCUTANEOUS CORONARY INTERVENTION-BALLOON ONLY;  Surgeon: Jettie Booze, MD;  Location: [facility] CATH LAB;  Service: Cardiovascular;;  . ROTATOR CUFF REPAIR Right 07/2007   Dr. Percell Miller  . ROTATOR CUFF REPAIR Left 11/2004  . stent in left leg, behind knee  06/27/2017   x2, done at [facility] cardiology in [facility]  . TOE  AMPUTATION Right 08/2008   3rd toe, Dr. Sharol Given due to osteomyelitis  . VAGINAL HYSTERECTOMY  03/1975  . VEIN LIGATION Bilateral 03/1966  . VENA CAVA FILTER PLACEMENT  01/2010   green filter; "due to blood clots"  . WISDOM TOOTH EXTRACTION  06/2007    FAMILY HISTORY: Family History  Problem Relation Age of Onset  . Heart disease Mother        mitral valve replaced  . Arthritis Mother   . Diabetic kidney disease Daughter   . Diabetes Father   . Stroke Father   . Migraines Sister   . Hypertension Other   . Breast cancer Other   . Headache Sister   . Colon cancer Neg Hx   . Esophageal cancer Neg Hx   . Stomach cancer Neg Hx   . Rectal cancer Neg Hx     SOCIAL HISTORY:  Social History   Socioeconomic History  . Marital status: Divorced    Spouse name: Not on file  . Number of children: 3  . Years of education: 74  . Highest education level: Not on file  Occupational History  . Occupation: Retired     Comment: from Orth[employer]   Social Needs  . Financial resource strain: Not on file  . Food insecurity    Worry: Not on file    Inability: Not on file  . Transportation needs    Medical: Not on file    Non-medical: Not on file  Tobacco Use  . Smoking status: Never Smoker  . Smokeless tobacco: Never Used  . Tobacco comment: tried for a few months in college.   Substance and Sexual Activity  . Alcohol use: No    Alcohol/week: 0.0 standard drinks  . Drug use: No  . Sexual activity: Not Currently    Birth control/protection: Post-menopausal  Lifestyle  . Physical activity    Days per week: Not on file    Minutes per session: Not on file  . Stress: Not on file  Relationships  . Social Herbalist on phone: Not on file    Gets together: Not on file    Attends religious service: Not on file    Active member of club or organization: Not on file    Attends meetings of clubs or organizations: Not on file    Relationship status: Not on file  . Intimate partner  violence    Fear of current or ex partner: Not on file    Emotionally abused: Not on file    Physically abused: Not on file    Forced sexual activity: Not on file  Other Topics Concern  . Not on file  Social History Narrative   Daughter lives w/ her and another daughter lives in town   1 son in [place]   Son comes home every 4 weeks to visit   Caffeine use:  Tea 1/day w/ dinner   Coffee occass             PHYSICAL EXAM  Vitals:   07/10/19 1053  BP: (!) 175/97  Pulse: 80  Temp: 97.7 F (36.5 C)  TempSrc: Oral  Height: 5' 4" (1.626 m)   Body mass index is 33.64 kg/m.  Generalized: Well developed, in no acute distress  Neurological examination  Mentation: Alert oriented to time, place, history taking. Follows all commands speech and language fluent Cranial nerve II-XII: Pupils were equal round reactive to light. Extraocular movements were full, visual field were full on confrontational test. Sensory: Sensory testing is intact to soft touch on all 4 extremities. No evidence of extinction is noted.  Coordination: Cerebellar testing reveals good finger-nose-finger and heel-to-shin bilaterally.  Gait and station: Gait is slow, uses Rolator   DIAGNOSTIC DATA (LABS, IMAGING, TESTING) - I reviewed patient records, labs, notes, testing and imaging myself where available.  MMSE - Mini Mental State Exam 12/14/2016  Orientation to time 5  Orientation to Place 5  Registration 3  Attention/ Calculation 5  Recall 2  Language- name 2 objects 2  Language- repeat 1  Language- follow 3 step command 3  Language- read & follow direction 1  Write a sentence 1  Copy design 1  Total score 29     Lab Results  Component Value Date   WBC 4.0 02/28/2019   HGB 12.7 02/28/2019   HCT 38.3 02/28/2019  MCV 94.1 02/28/2019   PLT 148.0 (L) 02/28/2019      Component Value Date/Time   NA 139 02/28/2019 1304   K 4.6 02/28/2019 1304   CL 104 02/28/2019 1304   CO2 26 02/28/2019 1304    GLUCOSE 162 (H) 02/28/2019 1304   BUN 21 02/28/2019 1304   CREATININE 1.20 02/28/2019 1304   CREATININE 1.02 (H) 09/20/2016 1436   CALCIUM 9.2 02/28/2019 1304   PROT 6.6 02/28/2019 1304   ALBUMIN 4.3 02/28/2019 1304   AST 11 02/28/2019 1304   ALT 7 02/28/2019 1304   ALKPHOS 60 02/28/2019 1304   BILITOT 0.5 02/28/2019 1304   GFRNONAA 43 (L) 09/26/2017 0512   GFRAA 50 (L) 09/26/2017 0512   Lab Results  Component Value Date   CHOL 137 02/28/2019   HDL 40.90 02/28/2019   LDLCALC 43 01/18/2018   LDLDIRECT 44.0 02/28/2019   TRIG 227.0 (H) 02/28/2019   CHOLHDL 3 02/28/2019   Lab Results  Component Value Date   HGBA1C 6.2 02/28/2019   Lab Results  Component Value Date   VITAMINB12 >1500 pg/mL (H) 09/10/2009   Lab Results  Component Value Date   TSH 1.44 09/20/2016     ASSESSMENT AND PLAN 84 y.o. year old female  has a past medical history of Abnormal echocardiogram, Adenomatous colon polyp, Allergic rhinitis, Arthritis, Bilateral sensorineural hearing loss, CAD (coronary artery disease), Cancer ([place]), Carcinoma in situ in a polyp (1994), Charcot's Foot, Chronic diastolic CHF (congestive heart failure) ([place]), Diverticulosis, DJD (degenerative joint disease), DVT (deep venous thrombosis) ([place]) (12/2007), Dyspnea, Fatty liver, GERD (gastroesophageal reflux disease), Head injury due to trauma, Headache, Hemorrhoids, Hiatal hernia, Hyperlipidemia, Hypertensive heart disease, IBS (irritable bowel syndrome), Macular degeneration (03/2009), Myocardial infarction ([place]) (2017), Neuropathy, Orthostatic hypotension, Osteomyelitis ([place]), Osteoporosis, PE (pulmonary embolism) (12/2007), Pneumonia (2015ish), PONV (postoperative nausea and vomiting) (2009), PPD positive, Pulmonary nodule, Recurrent UTI, RSD (reflex sympathetic dystrophy), Spinal stenosis, Type II diabetes mellitus ([place]), and Venous insufficiency. here with     ICD-10-CM   1. Migraine without aura and without status migrainosus, not  intractable  G43.009       [name] reports significant improvement in migraines since increasing amitriptyline to 25 mg daily.  We will continue current therapy.  I have advised against regular use of Tylenol or other over-the-counter analgesics due to concerns of rebound headaches.  We have also discussed concerns of daily opioids causing rebound headaches.  She feels that headaches are well managed at this point.  I have encouraged adequate hydration and healthy lifestyle habits.  Amitriptyline refill last prescribed by Dr. Larose Kells.  She was advised that she may continue follow-up with him.  She is aware that she can reach out to [place] for any new or worsening symptoms.  She verbalizes understanding and agreement with this plan.   No orders of the defined types were placed in this encounter.    No orders of the defined types were placed in this encounter.     I spent 15 minutes with the patient. 50% of this time was spent counseling and educating patient on plan of care and medications.    Debbora Presto, FNP-C 07/10/2019, 1:51 PM [facility] [address], [place], [place] [address] [phone]  Made any corrections needed, and agree with history, physical, neuro exam,assessment and plan as stated.     Sarina Ill, MD [facility]

## 2019-07-11 ENCOUNTER — Encounter: Payer: Self-pay | Admitting: Internal Medicine

## 2019-07-11 ENCOUNTER — Ambulatory Visit (INDEPENDENT_AMBULATORY_CARE_PROVIDER_SITE_OTHER): Payer: Medicare Other | Admitting: Internal Medicine

## 2019-07-11 ENCOUNTER — Ambulatory Visit (HOSPITAL_BASED_OUTPATIENT_CLINIC_OR_DEPARTMENT_OTHER)
Admission: RE | Admit: 2019-07-11 | Discharge: 2019-07-11 | Disposition: A | Discharge status: Home / Self Care | Payer: Medicare Other | Source: Ambulatory Visit | Attending: Internal Medicine | Admitting: Internal Medicine

## 2019-07-11 ENCOUNTER — Other Ambulatory Visit: Payer: Self-pay

## 2019-07-11 VITALS — BP 165/100 | HR 110 | Temp 96.2°F | Resp 16 | Ht 64.0 in

## 2019-07-11 DIAGNOSIS — R269 Unspecified abnormalities of gait and mobility: Secondary | ICD-10-CM | POA: Diagnosis not present

## 2019-07-11 DIAGNOSIS — N644 Mastodynia: Secondary | ICD-10-CM | POA: Diagnosis not present

## 2019-07-11 DIAGNOSIS — R296 Repeated falls: Secondary | ICD-10-CM

## 2019-07-11 DIAGNOSIS — I10 Essential (primary) hypertension: Secondary | ICD-10-CM | POA: Diagnosis not present

## 2019-07-11 DIAGNOSIS — R079 Chest pain, unspecified: Secondary | ICD-10-CM | POA: Diagnosis not present

## 2019-07-11 DIAGNOSIS — Z79899 Other long term (current) drug therapy: Secondary | ICD-10-CM | POA: Diagnosis not present

## 2019-07-11 DIAGNOSIS — G894 Chronic pain syndrome: Secondary | ICD-10-CM | POA: Diagnosis not present

## 2019-07-11 DIAGNOSIS — I2510 Atherosclerotic heart disease of native coronary artery without angina pectoris: Secondary | ICD-10-CM | POA: Diagnosis not present

## 2019-07-11 DIAGNOSIS — I2583 Coronary atherosclerosis due to lipid rich plaque: Secondary | ICD-10-CM | POA: Diagnosis not present

## 2019-07-11 DIAGNOSIS — M810 Age-related osteoporosis without current pathological fracture: Secondary | ICD-10-CM | POA: Diagnosis not present

## 2019-07-11 DIAGNOSIS — R0789 Other chest pain: Secondary | ICD-10-CM | POA: Diagnosis not present

## 2019-07-11 DIAGNOSIS — Z23 Encounter for immunization: Secondary | ICD-10-CM

## 2019-07-11 DIAGNOSIS — E1142 Type 2 diabetes mellitus with diabetic polyneuropathy: Secondary | ICD-10-CM | POA: Diagnosis not present

## 2019-07-11 LAB — COMPREHENSIVE METABOLIC PANEL
ALT: 8 U/L (ref 0–35)
AST: 13 U/L (ref 0–37)
Albumin: 4.2 g/dL (ref 3.5–5.2)
Alkaline Phosphatase: 69 U/L (ref 39–117)
BUN: 21 mg/dL (ref 6–23)
CO2: 25 mEq/L (ref 19–32)
Calcium: 9.5 mg/dL (ref 8.4–10.5)
Chloride: 105 mEq/L (ref 96–112)
Creatinine, Ser: 1.34 mg/dL — ABNORMAL HIGH (ref 0.40–1.20)
GFR: 37.86 mL/min — ABNORMAL LOW (ref >60.00)
Glucose, Bld: 127 mg/dL — ABNORMAL HIGH (ref 70–99)
Potassium: 4.5 mEq/L (ref 3.5–5.1)
Sodium: 138 mEq/L (ref 135–145)
Total Bilirubin: 0.5 mg/dL (ref 0.2–1.2)
Total Protein: 6.7 g/dL (ref 6.0–8.3)

## 2019-07-11 LAB — HEMOGLOBIN A1C: Hgb A1c MFr Bld: 6.3 % (ref 4.6–6.5)

## 2019-07-11 MED ORDER — DENOSUMAB 60 MG/ML ~~LOC~~ SOSY
60.0000 mg | PREFILLED_SYRINGE | Freq: Once | SUBCUTANEOUS | Status: AC
  Administered 2019-07-11: 60 mg via SUBCUTANEOUS

## 2019-07-11 NOTE — Progress Notes (Signed)
Subjective:    Patient ID: [name], female    DOB: August 07, 1935, 84 y.o.   MRN: [id number]  DOS:  07/11/2019 Type of visit - description: Routine visit Note from GI reviewed Note from cardiology reviewed Note from neurology reviewed Osteoporosis:  Due for Prolia  Complain of pain for the last month, starts in the left breast and moves backwards towards the spine. Unclear if it is worse when she moves her torso.  No associated rash.  Pain is not necessarily exertional that she can tell. Does not practice self breast exam Denies nipple discharge.   Having frequent falls, related to her prosthetic shoes/leg?   " I feel like walking on a sponge". Reports moderate to severe lack of balance without associated symptoms such as headache, dizziness per se, diplopia or slurred speech.   BP Readings from Last 3 Encounters:  07/11/19 (!) 165/100  07/10/19 (!) 146/80  05/09/19 130/70     Review of Systems See above No fever chills No chest pain no difficulty breathing No nausea, vomiting, diarrhea.  Past Medical History:  Diagnosis Date  . Abnormal echocardiogram    09/2013: mild LVH, mild focal basal hypertrophy of septum, EF 60-65%, normal WM, grade 1 diastolic dysfunction, MAC, mild LAE, ASA, PASP 34. Possible oscillating MV density - reviewed by MD and felt there was no significant abnormality other than MAC noted with mitral valve and did not require TEE.  . Adenomatous colon polyp   . Allergic rhinitis   . Arthritis    "back; ankles; hands; knees" (10/31/2013)  . Bilateral sensorineural hearing loss   . CAD (coronary artery disease)    a. Nonobst in 2011. b. Abnormal nuc 09/2013 -> s/p cutting balloon to D2, mild LAD disease; c. 08/2015 MV: no ischemia, EF 71%.  . Cancer ([place])    cancerous polyps  . Carcinoma in situ in a polyp 1994   a. 1994 - malignant polyp removed during colonoscopy.  . Charcot's Foot    a.  01/2016 s/p RLE transtibial amputation 2/2 Charcot  rocker-bottom deformity and insensate neuropathy ulceration.  . Chronic diastolic CHF (congestive heart failure) ([place])    a. 09/2013 Echo: EF 60-65%, mild LVH, Gr1 DD.  Marland Kitchen Diverticulosis   . DJD (degenerative joint disease)   . DVT (deep venous thrombosis) ([place]) 12/2007  . Dyspnea    a. Chronic, extensive w/u see OV note 09-2010. b. [place] 2011: ormal with RA 5 RV 31/2 PA 27/12 (19) PCW 10 CO normal. No evidence of shunting with sitting up in cath lab. c. CPX 2011: see report. d. Prior fluoro of diaphragm -  R diaphragm elevated at rest but both moved with inspiration.   . Fatty liver   . GERD (gastroesophageal reflux disease)    a. Hx GERD/esophageal dysmotility followed by Dr. Olevia Perches.   Marland Kitchen Head injury due to trauma   . Headache    Optic migraine  . Hemorrhoids   . Hiatal hernia    a. s/p Nissen fundoplication 7253.  Marland Kitchen Hyperlipidemia    a. patient unwilling to use statins.  . Hypertensive heart disease   . IBS (irritable bowel syndrome)   . Macular degeneration 03/2009   Dr. Rosana Hoes  . Myocardial infarction ([place]) 2017  . Neuropathy    a. Hands, feet, legs.  . Orthostatic hypotension   . Osteomyelitis ([place])    a. 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 01/2016 s/p RLE transtibial  amputation 2/2 Charcot rocker-bottom deformity and insensate neuropathy ulceration.  . Osteoporosis   . PE (pulmonary embolism) 12/2007   a. PE/DVT after neck surgery 2009. b. coumadin d/c 10-2008.  Marland Kitchen Pneumonia 2015ish  . PONV (postoperative nausea and vomiting) 2009   neck surgery  . PPD positive   . Pulmonary nodule    incidental per CT:  Pet scan 4-9: likely benign, CT 08-2009 no change, no further CTs (Dr. Gwenette Greet)  . Recurrent UTI   . RSD (reflex sympathetic dystrophy)    a. Chronic pain.  Marland Kitchen Spinal stenosis   . Type II diabetes mellitus ([facility])    no on medication  . Venous insufficiency    a. Contributing to LEE.    Past Surgical History:   Procedure Laterality Date  . AMPUTATION  03/06/2012   Procedure: AMPUTATION FOOT;  Surgeon: Newt Minion, MD;  Location: [place];  Service: Orthopedics;  Laterality: Left;  FIFTH RAY AMPUTATION   . AMPUTATION Right 02/18/2016   Procedure: AMPUTATION BELOW KNEE;  Surgeon: Newt Minion, MD;  Location: [place];  Service: Orthopedics;  Laterality: Right;  . AMPUTATION Left 11/22/2016   Procedure: Amputation 4th Toe Left Foot at Metatarsophalangeal Joint;  Surgeon: Newt Minion, MD;  Location: [facility];  Service: Orthopedics;  Laterality: Left;  . ANKLE FUSION  09/27/2012   Procedure: ANKLE FUSION;  Surgeon: Newt Minion, MD;  Location: [place];  Service: Orthopedics;  Laterality: Left;  Left Tibiocalcaneal Fusion  . ANKLE FUSION Right 05/09/2013   Procedure: ANKLE FUSION;  Surgeon: Newt Minion, MD;  Location: [place];  Service: Orthopedics;  Laterality: Right;  Excision Osteomyelitis Base 1st MT Right Foot, Fusion Medial Column  . ANTERIOR CERVICAL DECOMP/DISCECTOMY FUSION  12/18/07   For OA,  Dr. Lorin Mercy:  fu by a PE  . CARDIAC CATHETERIZATION  05/2010    at [facility]  . CARPAL TUNNEL RELEASE Left 09/2011  . CATARACT EXTRACTION W/ INTRAOCULAR LENS  IMPLANT, BILATERAL  2004   feb 2004 left, aug 2004 right  . CHOLECYSTECTOMY  03/2002  . CORONARY ANGIOPLASTY  10/31/2013  . CORONARY ARTERY BYPASS GRAFT  09/27/2016  . CYST REMOVAL HAND  06/2003  . FOOT BONE EXCISION Right 06/2009  . LAPAROSCOPIC RIGHT HEMI COLECTOMY N/A 01/08/2015   Procedure: LAPAROSCOPIC ASSISTED RIGHT HEMI COLECTOMY;  Surgeon: Johnathan Hausen, MD;  Location: [facility];  Service: General;  Laterality: N/A;  . LEFT HEART CATHETERIZATION WITH CORONARY ANGIOGRAM N/A 10/31/2013   Procedure: LEFT HEART CATHETERIZATION WITH CORONARY ANGIOGRAM;  Surgeon: Jettie Booze, MD;  Location: [facility] CATH LAB;  Service: Cardiovascular;  Laterality: N/A;  . NASAL SEPTUM SURGERY  09/1963  . NISSEN FUNDOPLICATION  0/12/1592  . PERCUTANEOUS CORONARY INTERVENTION-BALLOON  ONLY  10/31/2013   Procedure: PERCUTANEOUS CORONARY INTERVENTION-BALLOON ONLY;  Surgeon: Jettie Booze, MD;  Location: [facility] CATH LAB;  Service: Cardiovascular;;  . ROTATOR CUFF REPAIR Right 07/2007   Dr. Percell Miller  . ROTATOR CUFF REPAIR Left 11/2004  . stent in left leg, behind knee  06/27/2017   x2, done at [facility] cardiology in [facility]  . TOE AMPUTATION Right 08/2008   3rd toe, Dr. Sharol Given due to osteomyelitis  . VAGINAL HYSTERECTOMY  03/1975  . VEIN LIGATION Bilateral 03/1966  . VENA CAVA FILTER PLACEMENT  01/2010   green filter; "due to blood clots"  . WISDOM TOOTH EXTRACTION  06/2007    Social History   Socioeconomic History  . Marital status: Divorced  Spouse name: Not on file  . Number of children: 3  . Years of education: 50  . Highest education level: Not on file  Occupational History  . Occupation: Retired     Comment: from Orth[employer]   Social Needs  . Financial resource strain: Not on file  . Food insecurity    Worry: Not on file    Inability: Not on file  . Transportation needs    Medical: Not on file    Non-medical: Not on file  Tobacco Use  . Smoking status: Never Smoker  . Smokeless tobacco: Never Used  . Tobacco comment: tried for a few months in college.   Substance and Sexual Activity  . Alcohol use: No    Alcohol/week: 0.0 standard drinks  . Drug use: No  . Sexual activity: Not Currently    Birth control/protection: Post-menopausal  Lifestyle  . Physical activity    Days per week: Not on file    Minutes per session: Not on file  . Stress: Not on file  Relationships  . Social Herbalist on phone: Not on file    Gets together: Not on file    Attends religious service: Not on file    Active member of club or organization: Not on file    Attends meetings of clubs or organizations: Not on file    Relationship status: Not on file  . Intimate partner violence    Fear of current or ex partner: Not on file    Emotionally abused: Not  on file    Physically abused: Not on file    Forced sexual activity: Not on file  Other Topics Concern  . Not on file  Social History Narrative   Daughter lives w/ her and another daughter lives in town   1 son in [place]   Son comes home every 4 weeks to visit   Caffeine use:  Tea 1/day w/ dinner   Coffee occass             Allergies as of 07/11/2019      Reactions   Cymbalta [duloxetine Hcl] Swelling   Swelling in legs   Gabapentin Swelling   Swelling in legs Swelling in legs   Silicone Hives, Itching, Dermatitis, Rash   Metformin And Related Other (See Comments)   dizzy, tired, chills, diarrhea, and nausea   Adhesive [tape] Rash   rash rash   Cefazolin Hives   Hives Hives   Ciprofloxacin Other (See Comments)   Other reaction(s): Other (See Comments) Per pt, caused body aches Per pt, caused body aches   Clorazepate Dipotassium Other (See Comments)   Unknown reaction   Dilaudid [hydromorphone Hcl] Other (See Comments)    confused, intense itching   Doxycycline Rash   Enablex [darifenacin Hydrobromide Er] Other (See Comments)   Hypotension, near syncope   Levofloxacin Other (See Comments)   Causes wrist pain   Lyrica [pregabalin] Swelling   Swelling in legs   Methadone Hcl Other (See Comments)   Reaction unknown   Morphine And Related Other (See Comments)   Confusion, constipation.   Talwin [pentazocine] Other (See Comments)   "climbing walls" anxiety      Medication List       Accurate as of July 11, 2019 11:59 PM. If you have any questions, ask your nurse or doctor.        Alpha-Lipoic Acid 600 MG Caps Take 600 mg by mouth 2 (two) times daily.  amitriptyline 25 MG tablet Commonly known as: ELAVIL Take 1 tablet (25 mg total) by mouth at bedtime.   atorvastatin 40 MG tablet Commonly known as: LIPITOR Take 40 mg by mouth daily.   carvedilol 25 MG tablet Commonly known as: COREG Take 25 mg by mouth 2 (two) times daily with a meal.    clopidogrel 75 MG tablet Commonly known as: PLAVIX Take 75 mg by mouth at bedtime.   diclofenac sodium 1 % Gel Commonly known as: VOLTAREN APPLY 2-4 GRAMS TO AFFECTED AREA 3 TIMES DAILY AS NEEDED   ferrous sulfate 325 (65 FE) MG tablet Commonly known as: FeroSul Take 1 tablet (325 mg total) by mouth 2 (two) times daily with a meal.   freestyle lancets CHECK BLOOD SUGAR TWICE DAILY   FREESTYLE LITE test strip Generic drug: glucose blood USE TO CHECK BLOOD SUGAR NO MORE THAN TWICE DAILY   furosemide 20 MG tablet Commonly known as: LASIX Take 20 mg by mouth daily.   HYDROcodone-acetaminophen 5-325 MG tablet Commonly known as: NORCO/VICODIN Take 1 tablet by mouth every 6 (six) hours as needed for moderate pain.   hydrocortisone 2.5 % rectal cream Commonly known as: ANUSOL-HC Place 1 application rectally 2 (two) times daily.   hydrocortisone 25 MG suppository Commonly known as: ANUSOL-HC Place 1 suppository (25 mg total) rectally every 12 (twelve) hours.   lidocaine 5 % Commonly known as: Lidoderm Place 1 patch onto the skin daily. Remove & Discard patch within 12 hours or as directed by MD   meclizine 25 MG tablet Commonly known as: ANTIVERT Take 1 tablet (25 mg total) by mouth 2 (two) times daily as needed for dizziness.   methenamine 1 g tablet Commonly known as: HIPREX Take 1 tablet by mouth 2 (two) times daily.   nitroGLYCERIN 0.4 MG SL tablet Commonly known as: NITROSTAT Place 0.4 mg under the tongue every 5 (five) minutes as needed for chest pain (x 3 doses). Reported on 04/14/2016   omeprazole 20 MG capsule Commonly known as: PRILOSEC Take 1 capsule (20 mg total) by mouth 2 (two) times daily.   ondansetron 4 MG disintegrating tablet Commonly known as: ZOFRAN-ODT TAKE 1 TABLET BY MOUTH EVERY 8 HOURS AS NEEDED FOR NAUSEA OR VOMITING   OVER THE COUNTER MEDICATION 1 tablet 3 (three) times daily. Berberorine Gluco Defense   OVER THE COUNTER MEDICATION Take  1 capsule by mouth 2 (two) times daily. Integrative Digestive Formula   pramipexole 0.125 MG tablet Commonly known as: MIRAPEX Take 1-2 tablets (0.125-0.25 mg total) by mouth at bedtime as needed.   PreserVision AREDS 2 Caps Take 1 capsule by mouth 2 (two) times daily.   REFRESH OP Place 1 drop into both eyes 3 (three) times daily as needed (dry eyes).   Vitamin B-12 500 MCG Subl Place 500 mcg under the tongue daily.   Vitamin D3 125 MCG (5000 UT) Caps Take 5,000 Units by mouth every evening. Reported on 11/24/2015   Xarelto 2.5 MG Tabs tablet Generic drug: rivaroxaban Take 2.5 mg by mouth 2 (two) times daily.           Objective:   Physical Exam Musculoskeletal:       Arms:    BP (!) 165/100 (BP Location: Left Arm, Patient Position: Sitting, Cuff Size: Normal)   Pulse (!) 110   Temp (!) 96.2 F (35.7 C) (Temporal)   Resp 16   Ht _0  (1.626 m)   SpO2 100%   BMI 33.64 kg/m  General:   Well developed, NAD, BMI noted. HEENT:  Normocephalic . Face symmetric, atraumatic Lungs:  CTA B Normal respiratory effort, no intercostal retractions, no accessory muscle use. Heart: RRR,  no murmur.  Lower extremities: Status post amputations. Breasts : No dominant mass, no axillary lymph nodes.  Slightly TTP at the external quadrants of the left breast. MSK: Slightly TTP at the left chest, see graphic Skin: Not pale. Not jaundice Neurologic:  alert & oriented X3.  Speech normal, gait assisted by a walker, c/w previous amputations Psych--  Cognition and judgment appear intact.  Cooperative with normal attention span and concentration.  Behavior appropriate. No anxious or depressed appearing.      Assessment    Assessment DM, Neuropathy, amputations due to Charcot feet. - metformin intolerant  (lethargic) HTN  Hyperlipidemia LUNG MASS -incidental per CT 2009,  PET scan 01-2008: likely benign, CT 08-2009 no change, no further CTs per Dr Gwenette Greet - Had a CT in [place] 09-2016 "spiculated mass", saw Dr Lamonte Sakai,  CT  09/2017 stable CV: ---CAD sees Dr Meda Coffee and a cardiologist in [facility] --NSTEMI 09-2016. Outside hospital: Cath 2 vessel disease, CABG 2, LIMA to LAD and Diag   ---Chronic diastolic CHF ---PVD: Aortogram and a stent L Leg Dr Feliberto Gottron, 06-27-2017 @ [facility] (per pt) GI: GERD, IBS, colon polyps, PUD, abdominal pain "post polypectomy syndrome" naueas meds prn (antivert-zofran, gets nausea when car traveling) Osteoporosis  MSK,pain mngmt : --Reflex sympathetic dystrophy --Neuropathy --DJD --Spinal stenosis  --H/o Osteomyelitis  Foot --s/p amputation:  4th 5th L toes , R BKA  (01-2016, dx osteomyelitis) w/ phantom pain  -- sees Dr Sharol Given prn Takes nortriptyline also for leg cramps. She had local injections with Dr. Maia Petties 2016 w/o apparent help. She took oxycodone after surgery and that did NOT seem to help better than hydrocodone. Intolerant to Cymbalta, Neurontin ; Lyrica helped but caused edema. Recurrent UTIs: Dr Junious Silk  Venous insufficiency  H/o  + PPD H/o hypothyroidism: TSHs stable H/o dyspnea  PLAN:  HTN: BP goal 140/90 or less per cardiology recommendation.  BP at the time of cardiology visit 3 days ago:134/68, BP elevated at neurology yesterday and here today. Plan: no change for now. Check a CMP.  Continue carvedilol, Lasix, monitor BPs at home, follow-up 6 weeks. Cardiovascular: Saw cardiology 07/09/2019: Recommend to continue Plavix and Xarelto.  Stop aspirin. CHF felt to be stable. Chronic intermittent rectal bleeding, chronic constipation: Saw GI 05/09/2019. Rx Anusol, MiraLAX. No longer candidate for surveillance colonoscopy  Migraines: Saw neurology yesterday, significant improvement with amitriptyline.  To be refilled by PCP Chest wall pain, left breast pain: Pain as described above, seems to be MSK problem.  She had few falls and may have injured that area.  Last mammogram 05-2018 it was normal. Plan: Chest x-ray, patient  will schedule her mammogram, reassess on RTC. RSD, neuropathy, DJD: UDS today Imbalance, frequent falls, status post amputations, see surgical history: The patient is not sure if her prosthetics are adequate, she is already doing physical therapy, wonders what else she can do.  Will refer to Dr. Letta Pate to see if her balance can be improved or prosthetics adjusted. Osteoporosis: Prolia today Flu shot today RTC 6 weeks

## 2019-07-11 NOTE — Patient Instructions (Addendum)
Please schedule Medicare Wellness with Glenard Haring.   Per our records you are due for an eye exam. Please contact your eye doctor to schedule an appointment. Please have them send copies of your office visit notes to [place]. Our fax number is [phone].   GO TO THE LAB : Get the blood work     GO TO THE FRONT DESK Schedule your next appointment   schedule a follow-up with me in 6 weeks    STOP BY THE FIRST FLOOR:  get the XR   Check your blood pressures daily BP GOAL is between 110/65 and  135/85. If it is consistently higher or lower, let me know

## 2019-07-11 NOTE — Progress Notes (Signed)
Pre visit review using our clinic review tool, if applicable. No additional management support is needed unless otherwise documented below in the visit note. 

## 2019-07-12 NOTE — Assessment & Plan Note (Signed)
HTN: BP goal 140/90 or less per cardiology recommendation.  BP at the time of cardiology visit 3 days ago:134/68, BP elevated at neurology yesterday and here today. Plan: no change for now. Check a CMP.  Continue carvedilol, Lasix, monitor BPs at home, follow-up 6 weeks. Cardiovascular: Saw cardiology 07/09/2019: Recommend to continue Plavix and Xarelto.  Stop aspirin. CHF felt to be stable. Chronic intermittent rectal bleeding, chronic constipation: Saw GI 05/09/2019. Rx Anusol, MiraLAX. No longer candidate for surveillance colonoscopy  Migraines: Saw neurology yesterday, significant improvement with amitriptyline.  To be refilled by PCP Chest wall pain, left breast pain: Pain as described above, seems to be MSK problem.  She had few falls and may have injured that area.  Last mammogram 05-2018 it was normal. Plan: Chest x-ray, patient will schedule her mammogram, reassess on RTC. RSD, neuropathy, DJD: UDS today Imbalance, frequent falls, status post amputations, see surgical history: The patient is not sure if her prosthetics are adequate, she is already doing physical therapy, wonders what else she can do.  Will refer to Dr. Letta Pate to see if her balance can be improved or prosthetics adjusted. Osteoporosis: Prolia today Flu shot today RTC 6 weeks

## 2019-07-13 LAB — PAIN MGMT, PROFILE 8 W/CONF, U
6 Acetylmorphine: NEGATIVE ng/mL
Alcohol Metabolites: NEGATIVE ng/mL (ref <500)
Amphetamines: NEGATIVE ng/mL
Benzodiazepines: NEGATIVE ng/mL
Buprenorphine, Urine: NEGATIVE ng/mL
Cocaine Metabolite: NEGATIVE ng/mL
Codeine: NEGATIVE ng/mL
Creatinine: 37.3 mg/dL
Hydrocodone: 439 ng/mL
Hydromorphone: 66 ng/mL
MDMA: NEGATIVE ng/mL
Marijuana Metabolite: NEGATIVE ng/mL
Morphine: NEGATIVE ng/mL
Norhydrocodone: 596 ng/mL
Opiates: POSITIVE ng/mL
Oxidant: NEGATIVE ug/mL
Oxycodone: NEGATIVE ng/mL
pH: 6.2 (ref 4.5–9.0)

## 2019-07-14 ENCOUNTER — Encounter: Payer: Self-pay | Admitting: Internal Medicine

## 2019-07-16 DIAGNOSIS — R2689 Other abnormalities of gait and mobility: Secondary | ICD-10-CM | POA: Diagnosis not present

## 2019-07-16 DIAGNOSIS — M25512 Pain in left shoulder: Secondary | ICD-10-CM | POA: Diagnosis not present

## 2019-07-16 DIAGNOSIS — G8921 Chronic pain due to trauma: Secondary | ICD-10-CM | POA: Diagnosis not present

## 2019-07-16 DIAGNOSIS — G8929 Other chronic pain: Secondary | ICD-10-CM | POA: Diagnosis not present

## 2019-07-16 DIAGNOSIS — M25562 Pain in left knee: Secondary | ICD-10-CM | POA: Diagnosis not present

## 2019-07-16 DIAGNOSIS — M545 Low back pain: Secondary | ICD-10-CM | POA: Diagnosis not present

## 2019-07-16 DIAGNOSIS — G894 Chronic pain syndrome: Secondary | ICD-10-CM | POA: Diagnosis not present

## 2019-07-16 DIAGNOSIS — M25522 Pain in left elbow: Secondary | ICD-10-CM | POA: Diagnosis not present

## 2019-07-16 DIAGNOSIS — M6281 Muscle weakness (generalized): Secondary | ICD-10-CM | POA: Diagnosis not present

## 2019-07-16 DIAGNOSIS — M542 Cervicalgia: Secondary | ICD-10-CM | POA: Diagnosis not present

## 2019-07-21 ENCOUNTER — Telehealth: Payer: Self-pay | Admitting: Internal Medicine

## 2019-07-21 DIAGNOSIS — M25522 Pain in left elbow: Secondary | ICD-10-CM | POA: Diagnosis not present

## 2019-07-21 DIAGNOSIS — R2689 Other abnormalities of gait and mobility: Secondary | ICD-10-CM | POA: Diagnosis not present

## 2019-07-21 DIAGNOSIS — M25562 Pain in left knee: Secondary | ICD-10-CM | POA: Diagnosis not present

## 2019-07-21 DIAGNOSIS — G8921 Chronic pain due to trauma: Secondary | ICD-10-CM | POA: Diagnosis not present

## 2019-07-21 DIAGNOSIS — M542 Cervicalgia: Secondary | ICD-10-CM | POA: Diagnosis not present

## 2019-07-21 DIAGNOSIS — G894 Chronic pain syndrome: Secondary | ICD-10-CM | POA: Diagnosis not present

## 2019-07-21 DIAGNOSIS — G8929 Other chronic pain: Secondary | ICD-10-CM | POA: Diagnosis not present

## 2019-07-21 DIAGNOSIS — M25512 Pain in left shoulder: Secondary | ICD-10-CM | POA: Diagnosis not present

## 2019-07-21 DIAGNOSIS — M545 Low back pain: Secondary | ICD-10-CM | POA: Diagnosis not present

## 2019-07-21 DIAGNOSIS — M6281 Muscle weakness (generalized): Secondary | ICD-10-CM | POA: Diagnosis not present

## 2019-07-21 MED ORDER — HYDROCODONE-ACETAMINOPHEN 5-325 MG PO TABS: 1.0000 | ORAL_TABLET | Freq: Four times a day (QID) | ORAL | 0 refills | Status: DC | PRN

## 2019-07-21 NOTE — Telephone Encounter (Signed)
Medication Refill - Medication: HYDROcodone-acetaminophen (NORCO/VICODIN) 5-325 MG tablet  Has the patient contacted their pharmacy? Yes - needs new script sent, she would like a 3 month supply sent (Agent: If no, request that the patient contact the pharmacy for the refill.) (Agent: If yes, when and what did the pharmacy advise?)  Preferred Pharmacy (with phone number or street name):  [id number] - [place] [phone] (Phone) [phone] (Fax)   Agent: Please be advised that RX refills may take up to 3 business days. We ask that you follow-up with your pharmacy.

## 2019-07-21 NOTE — Telephone Encounter (Signed)
Sent!

## 2019-07-21 NOTE — Telephone Encounter (Signed)
Hydrocodone refill.   Last OV: 07/11/2019 Last Fill: 06/19/2019 #120 and 0RF Pt sig: 1 tab q6h prn UDS: 07/11/2019 Low risk

## 2019-07-24 DIAGNOSIS — M6281 Muscle weakness (generalized): Secondary | ICD-10-CM | POA: Diagnosis not present

## 2019-07-24 DIAGNOSIS — M25512 Pain in left shoulder: Secondary | ICD-10-CM | POA: Diagnosis not present

## 2019-07-24 DIAGNOSIS — G8929 Other chronic pain: Secondary | ICD-10-CM | POA: Diagnosis not present

## 2019-07-24 DIAGNOSIS — M25562 Pain in left knee: Secondary | ICD-10-CM | POA: Diagnosis not present

## 2019-07-24 DIAGNOSIS — M542 Cervicalgia: Secondary | ICD-10-CM | POA: Diagnosis not present

## 2019-07-24 DIAGNOSIS — G894 Chronic pain syndrome: Secondary | ICD-10-CM | POA: Diagnosis not present

## 2019-07-24 DIAGNOSIS — M545 Low back pain: Secondary | ICD-10-CM | POA: Diagnosis not present

## 2019-07-24 DIAGNOSIS — R2689 Other abnormalities of gait and mobility: Secondary | ICD-10-CM | POA: Diagnosis not present

## 2019-07-24 DIAGNOSIS — G8921 Chronic pain due to trauma: Secondary | ICD-10-CM | POA: Diagnosis not present

## 2019-07-24 DIAGNOSIS — M25522 Pain in left elbow: Secondary | ICD-10-CM | POA: Diagnosis not present

## 2019-07-25 ENCOUNTER — Encounter: Payer: Self-pay | Admitting: Physical Medicine & Rehabilitation

## 2019-07-30 DIAGNOSIS — M6281 Muscle weakness (generalized): Secondary | ICD-10-CM | POA: Diagnosis not present

## 2019-07-30 DIAGNOSIS — M545 Low back pain: Secondary | ICD-10-CM | POA: Diagnosis not present

## 2019-07-30 DIAGNOSIS — G894 Chronic pain syndrome: Secondary | ICD-10-CM | POA: Diagnosis not present

## 2019-07-30 DIAGNOSIS — M542 Cervicalgia: Secondary | ICD-10-CM | POA: Diagnosis not present

## 2019-07-30 DIAGNOSIS — M25562 Pain in left knee: Secondary | ICD-10-CM | POA: Diagnosis not present

## 2019-07-30 DIAGNOSIS — M25512 Pain in left shoulder: Secondary | ICD-10-CM | POA: Diagnosis not present

## 2019-07-30 DIAGNOSIS — G8921 Chronic pain due to trauma: Secondary | ICD-10-CM | POA: Diagnosis not present

## 2019-07-30 DIAGNOSIS — R2689 Other abnormalities of gait and mobility: Secondary | ICD-10-CM | POA: Diagnosis not present

## 2019-07-30 DIAGNOSIS — G8929 Other chronic pain: Secondary | ICD-10-CM | POA: Diagnosis not present

## 2019-07-30 DIAGNOSIS — M25522 Pain in left elbow: Secondary | ICD-10-CM | POA: Diagnosis not present

## 2019-08-01 DIAGNOSIS — M542 Cervicalgia: Secondary | ICD-10-CM | POA: Diagnosis not present

## 2019-08-01 DIAGNOSIS — G8921 Chronic pain due to trauma: Secondary | ICD-10-CM | POA: Diagnosis not present

## 2019-08-01 DIAGNOSIS — M6281 Muscle weakness (generalized): Secondary | ICD-10-CM | POA: Diagnosis not present

## 2019-08-01 DIAGNOSIS — M25522 Pain in left elbow: Secondary | ICD-10-CM | POA: Diagnosis not present

## 2019-08-01 DIAGNOSIS — G8929 Other chronic pain: Secondary | ICD-10-CM | POA: Diagnosis not present

## 2019-08-01 DIAGNOSIS — M545 Low back pain: Secondary | ICD-10-CM | POA: Diagnosis not present

## 2019-08-01 DIAGNOSIS — M25512 Pain in left shoulder: Secondary | ICD-10-CM | POA: Diagnosis not present

## 2019-08-01 DIAGNOSIS — R2689 Other abnormalities of gait and mobility: Secondary | ICD-10-CM | POA: Diagnosis not present

## 2019-08-01 DIAGNOSIS — M25562 Pain in left knee: Secondary | ICD-10-CM | POA: Diagnosis not present

## 2019-08-01 DIAGNOSIS — I10 Essential (primary) hypertension: Secondary | ICD-10-CM | POA: Diagnosis not present

## 2019-08-01 DIAGNOSIS — I25728 Atherosclerosis of autologous artery coronary artery bypass graft(s) with other forms of angina pectoris: Secondary | ICD-10-CM | POA: Diagnosis not present

## 2019-08-01 DIAGNOSIS — G894 Chronic pain syndrome: Secondary | ICD-10-CM | POA: Diagnosis not present

## 2019-08-06 DIAGNOSIS — M25512 Pain in left shoulder: Secondary | ICD-10-CM | POA: Diagnosis not present

## 2019-08-06 DIAGNOSIS — G894 Chronic pain syndrome: Secondary | ICD-10-CM | POA: Diagnosis not present

## 2019-08-06 DIAGNOSIS — M25562 Pain in left knee: Secondary | ICD-10-CM | POA: Diagnosis not present

## 2019-08-06 DIAGNOSIS — G8921 Chronic pain due to trauma: Secondary | ICD-10-CM | POA: Diagnosis not present

## 2019-08-06 DIAGNOSIS — R2689 Other abnormalities of gait and mobility: Secondary | ICD-10-CM | POA: Diagnosis not present

## 2019-08-06 DIAGNOSIS — M6281 Muscle weakness (generalized): Secondary | ICD-10-CM | POA: Diagnosis not present

## 2019-08-06 DIAGNOSIS — M545 Low back pain: Secondary | ICD-10-CM | POA: Diagnosis not present

## 2019-08-06 DIAGNOSIS — G8929 Other chronic pain: Secondary | ICD-10-CM | POA: Diagnosis not present

## 2019-08-06 DIAGNOSIS — M542 Cervicalgia: Secondary | ICD-10-CM | POA: Diagnosis not present

## 2019-08-06 DIAGNOSIS — M25522 Pain in left elbow: Secondary | ICD-10-CM | POA: Diagnosis not present

## 2019-08-07 DIAGNOSIS — E782 Mixed hyperlipidemia: Secondary | ICD-10-CM | POA: Diagnosis not present

## 2019-08-07 DIAGNOSIS — E119 Type 2 diabetes mellitus without complications: Secondary | ICD-10-CM | POA: Diagnosis not present

## 2019-08-07 DIAGNOSIS — I25728 Atherosclerosis of autologous artery coronary artery bypass graft(s) with other forms of angina pectoris: Secondary | ICD-10-CM | POA: Diagnosis not present

## 2019-08-07 DIAGNOSIS — I10 Essential (primary) hypertension: Secondary | ICD-10-CM | POA: Diagnosis not present

## 2019-08-13 ENCOUNTER — Ambulatory Visit: Payer: Medicare Other | Admitting: Physical Medicine & Rehabilitation

## 2019-08-14 DIAGNOSIS — Z1231 Encounter for screening mammogram for malignant neoplasm of breast: Secondary | ICD-10-CM | POA: Diagnosis not present

## 2019-08-14 LAB — HM MAMMOGRAPHY

## 2019-08-18 ENCOUNTER — Other Ambulatory Visit: Payer: Self-pay

## 2019-08-18 ENCOUNTER — Encounter: Payer: Self-pay | Admitting: Physical Medicine & Rehabilitation

## 2019-08-18 ENCOUNTER — Encounter: Payer: Medicare Other | Attending: Physical Medicine & Rehabilitation | Admitting: Physical Medicine & Rehabilitation

## 2019-08-18 VITALS — BP 160/93 | HR 105 | Temp 98.3°F

## 2019-08-18 DIAGNOSIS — R2689 Other abnormalities of gait and mobility: Secondary | ICD-10-CM

## 2019-08-18 DIAGNOSIS — I2510 Atherosclerotic heart disease of native coronary artery without angina pectoris: Secondary | ICD-10-CM | POA: Diagnosis not present

## 2019-08-18 DIAGNOSIS — I2583 Coronary atherosclerosis due to lipid rich plaque: Secondary | ICD-10-CM | POA: Diagnosis not present

## 2019-08-18 DIAGNOSIS — R269 Unspecified abnormalities of gait and mobility: Secondary | ICD-10-CM

## 2019-08-18 DIAGNOSIS — E1142 Type 2 diabetes mellitus with diabetic polyneuropathy: Secondary | ICD-10-CM | POA: Diagnosis not present

## 2019-08-18 NOTE — Progress Notes (Signed)
Subjective:    Patient ID: [name], female    DOB: 1934-11-04, 84 y.o.   MRN: [id number] Chief complaint is poor balance HPI 84 year old female with history of severe diabetic neuropathy resulting in Osteomyelitis with right transtibial amputation in 2017.  Other comorbidities include chronic diastolic CHF.  Macular degeneration, history of PE, history of DVT, spinal stenosis Prior hx of RSD post left ankle sprain when she was younger Left LE septic arthritis following what sounds like a arthroscopic surgery of the left knee Left 4th and 5th digit amputation  Tends to fall backwards Uses a wheeled walker.  Has been going through outpatient therapy for "a couple years" Currently living with a son in [place] because she has 10 steps to enter her home. Has diabetic shoes She has not had any adjustments made to her prosthetic since it was fabricated in 2017.  She does alternate the thickness of her stump stockings between 3 and 5 ply.  pain Inventory Average Pain 7 Pain Right Now 7 My pain is intermittent, sharp, stabbing, tingling and aching  In the last 24 hours, has pain interfered with the following? General activity 8 Relation with others 8 Enjoyment of life 7 What TIME of day is your pain at its worst? night Sleep (in general) Fair  Pain is worse with: standing Pain improves with: rest, heat/ice, therapy/exercise and medication Relief from Meds: 5  Mobility walk with assistance use a cane use a walker how many minutes can you walk? 15 ability to climb steps?  yes do you drive?  no Do you have any goals in this area?  yes  Function retired I need assistance with the following:  meal prep, household duties and shopping Do you have any goals in this area?  yes  Neuro/Psych bladder control problems weakness numbness tingling trouble walking  Prior Studies new  Physicians involved in your care new\   Family History  Problem Relation Age of  Onset   Heart disease Mother        mitral valve replaced   Arthritis Mother    Diabetic kidney disease Daughter    Diabetes Father    Stroke Father    Migraines Sister    Hypertension Other    Breast cancer Other    Headache Sister    Colon cancer Neg Hx    Esophageal cancer Neg Hx    Stomach cancer Neg Hx    Rectal cancer Neg Hx    Social History   Socioeconomic History   Marital status: Divorced    Spouse name: Not on file   Number of children: 3   Years of education: 15   Highest education level: Not on file  Occupational History   Occupation: Retired     Comment: from Orth[employer]   Social Needs   Emergency planning/management officer strain: Not on file   Food insecurity    Worry: Not on file    Inability: Not on file   Transportation needs    Medical: Not on file    Non-medical: Not on file  Tobacco Use   Smoking status: Never Smoker   Smokeless tobacco: Never Used   Tobacco comment: tried for a few months in college.   Substance and Sexual Activity   Alcohol use: No    Alcohol/week: 0.0 standard drinks   Drug use: No   Sexual activity: Not Currently    Birth control/protection: Post-menopausal  Lifestyle   Physical activity    Days  per week: Not on file    Minutes per session: Not on file   Stress: Not on file  Relationships   Social connections    Talks on phone: Not on file    Gets together: Not on file    Attends religious service: Not on file    Active member of club or organization: Not on file    Attends meetings of clubs or organizations: Not on file    Relationship status: Not on file  Other Topics Concern   Not on file  Social History Narrative   Daughter lives w/ her and another daughter lives in town   1 son in [place]   Son comes home every 4 weeks to visit   Caffeine use:  Tea 1/day w/ dinner   Coffee occass          Past Surgical History:  Procedure Laterality Date   AMPUTATION  03/06/2012   Procedure: AMPUTATION  FOOT;  Surgeon: Newt Minion, MD;  Location: [place];  Service: Orthopedics;  Laterality: Left;  FIFTH RAY AMPUTATION    AMPUTATION Right 02/18/2016   Procedure: AMPUTATION BELOW KNEE;  Surgeon: Newt Minion, MD;  Location: [place];  Service: Orthopedics;  Laterality: Right;   AMPUTATION Left 11/22/2016   Procedure: Amputation 4th Toe Left Foot at Metatarsophalangeal Joint;  Surgeon: Newt Minion, MD;  Location: [place];  Service: Orthopedics;  Laterality: Left;   ANKLE FUSION  09/27/2012   Procedure: ANKLE FUSION;  Surgeon: Newt Minion, MD;  Location: [place];  Service: Orthopedics;  Laterality: Left;  Left Tibiocalcaneal Fusion   ANKLE FUSION Right 05/09/2013   Procedure: ANKLE FUSION;  Surgeon: Newt Minion, MD;  Location: [place];  Service: Orthopedics;  Laterality: Right;  Excision Osteomyelitis Base 1st MT Right Foot, Fusion Medial Column   ANTERIOR CERVICAL DECOMP/DISCECTOMY FUSION  12/18/07   For OA,  Dr. Lorin Mercy:  fu by a PE   CARDIAC CATHETERIZATION  05/2010    at [place] Left 09/2011   CATARACT EXTRACTION W/ INTRAOCULAR LENS  IMPLANT, BILATERAL  2004   feb 2004 left, aug 2004 right   CHOLECYSTECTOMY  03/2002   CORONARY ANGIOPLASTY  10/31/2013   CORONARY ARTERY BYPASS GRAFT  09/27/2016   CYST REMOVAL HAND  06/2003   FOOT BONE EXCISION Right 06/2009   LAPAROSCOPIC RIGHT HEMI COLECTOMY N/A 01/08/2015   Procedure: LAPAROSCOPIC ASSISTED RIGHT HEMI COLECTOMY;  Surgeon: Johnathan Hausen, MD;  Location: [facility];  Service: General;  Laterality: N/A;   LEFT HEART CATHETERIZATION WITH CORONARY ANGIOGRAM N/A 10/31/2013   Procedure: LEFT HEART CATHETERIZATION WITH CORONARY ANGIOGRAM;  Surgeon: Jettie Booze, MD;  Location: [facility] CATH LAB;  Service: Cardiovascular;  Laterality: N/A;   NASAL SEPTUM SURGERY  [id number]/8338   NISSEN FUNDOPLICATION  11/27/537   PERCUTANEOUS CORONARY INTERVENTION-BALLOON ONLY  10/31/2013   Procedure: PERCUTANEOUS CORONARY INTERVENTION-BALLOON ONLY;   Surgeon: Jettie Booze, MD;  Location: [facility] C[facility] LAB;  Service: Cardiovascular;;   ROTATOR CUFF REPAIR Right 07/2007   Dr. Morene Rankins CUFF REPAIR Left 11/2004   stent in left leg, behind knee  06/27/2017   x2, done at [facility] cardiology in [place] Right 08/2008   3rd toe, Dr. Sharol Given due to osteomyelitis   VAGINAL HYSTERECTOMY  03/1975   VEIN LIGATION Bilateral 03/1966   VENA CAVA FILTER PLACEMENT  01/2010   green filter; "due to blood clots"  WISDOM TOOTH EXTRACTION  06/2007   Past Medical History:  Diagnosis Date   Abnormal echocardiogram    09/2013: mild LVH, mild focal basal hypertrophy of septum, EF 60-65%, normal WM, grade 1 diastolic dysfunction, MAC, mild LAE, ASA, PASP 34. Possible oscillating MV density - reviewed by MD and felt there was no significant abnormality other than MAC noted with mitral valve and did not require TEE.   Adenomatous colon polyp    Allergic rhinitis    Arthritis    "back; ankles; hands; knees" (10/31/2013)   Bilateral sensorineural hearing loss    CAD (coronary artery disease)    a. Nonobst in 2011. b. Abnormal nuc 09/2013 -> s/p cutting balloon to D2, mild LAD disease; c. 08/2015 MV: no ischemia, EF 71%.   Cancer [facility])    cancerous polyps   Carcinoma in situ in a polyp 1994   a. 1994 - malignant polyp removed during colonoscopy.   Charcot's Foot    a.  01/2016 s/p RLE transtibial amputation 2/2 Charcot rocker-bottom deformity and insensate neuropathy ulceration.   Chronic diastolic CHF (congestive heart failure) ([place])    a. 09/2013 Echo: EF 60-65%, mild LVH, Gr1 DD.   Diverticulosis    DJD (degenerative joint disease)    DVT (deep venous thrombosis) ([place]) 12/2007   Dyspnea    a. Chronic, extensive w/u see OV note 09-2010. b. [place] 2011: ormal with RA 5 RV 31/2 PA 27/12 (19) PCW 10 CO normal. No evidence of shunting with sitting up in cath lab. c. CPX 2011: see report. d. Prior fluoro of diaphragm -  R  diaphragm elevated at rest but both moved with inspiration.    Fatty liver    GERD (gastroesophageal reflux disease)    a. Hx GERD/esophageal dysmotility followed by Dr. Olevia Perches.    Head injury due to trauma    Headache    Optic migraine   Hemorrhoids    Hiatal hernia    a. s/p Nissen fundoplication 0272.   Hyperlipidemia    a. patient unwilling to use statins.   Hypertensive heart disease    IBS (irritable bowel syndrome)    Macular degeneration 03/2009   Dr. Rosana Hoes   Myocardial infarction [facility]) 2017   Neuropathy    a. Hands, feet, legs.   Orthostatic hypotension    Osteomyelitis (HCC)    a. 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 01/2016 s/p RLE transtibial amputation 2/2 Charcot rocker-bottom deformity and insensate neuropathy ulceration.   Osteoporosis    PE (pulmonary embolism) 12/2007   a. PE/DVT after neck surgery 2009. b. coumadin d/c 10-2008.   Pneumonia 2015ish   PONV (postoperative nausea and vomiting) 2009   neck surgery   PPD positive    Pulmonary nodule    incidental per CT:  Pet scan 4-9: likely benign, CT 08-2009 no change, no further CTs (Dr. Gwenette Greet)   Recurrent UTI    RSD (reflex sympathetic dystrophy)    a. Chronic pain.   Spinal stenosis    Type II diabetes mellitus ([place])    no on medication   Venous insufficiency    a. Contributing to LEE.   BP (!) 160/93    Pulse (!) 105    Temp 98.3 F (36.8 C)    SpO2 98%   Opioid Risk Score:   Fall Risk Score:  `1  Depression screen PHQ 2/9  Depression screen [facility] 2/9 01/18/2018 12/14/2016 09/18/2016 06/11/2015 10/06/2014  Decreased Interest  0 0 0 0 0  Down, Depressed, Hopeless 0 0 0 0 0  PHQ - 2 Score 0 0 0 0 0  Some recent data might be hidden    Review of Systems  Constitutional: Positive for appetite change and unexpected weight change.  HENT: Negative.   Eyes: Negative.   Respiratory: Negative.   Gastrointestinal:  Positive for constipation and diarrhea.  Endocrine:       High blood sugar  Genitourinary: Positive for difficulty urinating and dysuria.  Musculoskeletal: Positive for arthralgias, back pain, gait problem, myalgias, neck pain and neck stiffness.  Skin: Positive for rash.  Allergic/Immunologic: Negative.   Neurological: Positive for weakness and numbness.       Tingling       Objective:   Physical Exam Vitals signs and nursing note reviewed.  Constitutional:      Appearance: Normal appearance.  HENT:     Head: Normocephalic and atraumatic.  Eyes:     Extraocular Movements: Extraocular movements intact.     Conjunctiva/sclera: Conjunctivae normal.     Pupils: Pupils are equal, round, and reactive to light.  Neck:     Musculoskeletal: Normal range of motion. No muscular tenderness.  Cardiovascular:     Rate and Rhythm: Normal rate and regular rhythm.     Pulses: Normal pulses.     Heart sounds: Normal heart sounds. No murmur.  Pulmonary:     Effort: Pulmonary effort is normal. No respiratory distress.     Breath sounds: Normal breath sounds. No wheezing.  Abdominal:     General: Abdomen is flat. There is no distension.     Palpations: Abdomen is soft. There is no mass.  Skin:    General: Skin is warm and dry.     Comments: 5 mm abrasion distal aspect of the right stump no erythema, small amount of serosanguineous drainage on the bandage  Neurological:     Mental Status: She is alert and oriented to person, place, and time.  Psychiatric:        Mood and Affect: Mood normal.        Behavior: Behavior normal.   Motor strength is 5/5 bilateral deltoid by stress of grip 4/5 bilateral hip flexors and left knee extensor right lower extremity not tested due to BKA Sensation is normal to pinprick and light touch in the upper limbs She has decreased sensation starting at the superior pole of patella on the left lower extremity down to the foot.  She has absent sensation to  proprioception in the toes but intact at the ankle. Patient has normal sensation to light touch at the right stump  Right  Musculoskeletal Normal knee range of motion bilaterally. Left ankle is fused Sit to stand is independent She ambulates with the left AFO as well as right below-knee amputee prosthetic Using rolling walker.  She has no evidence of pistoning no evidence of Trendelenburg gait.  Good foot clearance bilaterally.  No circumduction.     Assessment & Plan:  #1.  Balance disorder most likely related to her severe diabetic polyneuropathy.  This is complicated by right BKA as well as left ankle fusion. We discussed there is no medication injection or surgery to correct this balance disorder.  We discussed that muscle strengthening of the lower limbs would be the best treatment at this point.  Would particularly emphasize gluteal muscle strengthening.  Discussed sit to stand 3 sets of 10 at the sink.  Discussed clamshell exercises side-lying in bed 3  sets of 10 to be done at minimum every other day. We discussed avoiding activities such as sweeping she really needs to be holding onto her walker with both hands whenever she is up. Would avoid medications that cause sedation Looking at med list she is on hydrocodone, may want to consider tramadol instead. She lives in [place] so that follow-up is difficult.  It will be as needed follow-up with me  2.  Right BKA she has a small skin area on the distal stump which may be related to prosthetic fit.  She should follow-up with Dr. Sharol Given and perhaps call her prosthetists to check on fit.  Given history of diastolic CHF she may have intermittent edema that may affect her fit overall

## 2019-08-18 NOTE — Patient Instructions (Signed)
Clam Shell exercise 3 sets of 10 reps  Sit to stand exercise, 3 sets of 10 reps  Call Dr Sharol Given or your brace maker (prosthetist) to check fit of prosthetic

## 2019-08-19 ENCOUNTER — Encounter: Payer: Self-pay | Admitting: Internal Medicine

## 2019-08-20 ENCOUNTER — Telehealth: Payer: Self-pay | Admitting: Orthopedic Surgery

## 2019-08-20 NOTE — Telephone Encounter (Signed)
DUDA HAS PROSTHETIC LEG, HAS BEEN HAVING PROBLEMS. MAY NEED A NEW ONE, OR RE SIZED.NEEDS TO MAKE AN APPT. OR MAY NEED PRESCRIPTION FROM DR.  The number to contact patient is [phone]   Patient's daughter [name] ~

## 2019-08-21 ENCOUNTER — Telehealth: Payer: Self-pay | Admitting: Orthopedic Surgery

## 2019-08-21 NOTE — Telephone Encounter (Signed)
Patient called back asked if she can speak to Autumn concerning her right prosthetic leg and the problem she is having. Patient made an appointment for 09/15/2019. The number to contact patient is [phone]

## 2019-08-21 NOTE — Telephone Encounter (Signed)
Patient daughter was called and lvm to call back to get appt scheduled to be seen.

## 2019-08-22 ENCOUNTER — Ambulatory Visit (INDEPENDENT_AMBULATORY_CARE_PROVIDER_SITE_OTHER): Payer: Medicare Other | Admitting: Internal Medicine

## 2019-08-22 ENCOUNTER — Ambulatory Visit (HOSPITAL_BASED_OUTPATIENT_CLINIC_OR_DEPARTMENT_OTHER)
Admission: RE | Admit: 2019-08-22 | Discharge: 2019-08-22 | Disposition: A | Discharge status: Home / Self Care | Payer: Medicare Other | Source: Ambulatory Visit | Attending: Internal Medicine | Admitting: Internal Medicine

## 2019-08-22 ENCOUNTER — Other Ambulatory Visit: Payer: Self-pay

## 2019-08-22 ENCOUNTER — Encounter: Payer: Self-pay | Admitting: Internal Medicine

## 2019-08-22 VITALS — BP 162/78 | HR 86 | Temp 95.4°F | Resp 18 | Ht 63.0 in | Wt 170.0 lb

## 2019-08-22 DIAGNOSIS — I6782 Cerebral ischemia: Secondary | ICD-10-CM | POA: Diagnosis not present

## 2019-08-22 DIAGNOSIS — I2510 Atherosclerotic heart disease of native coronary artery without angina pectoris: Secondary | ICD-10-CM

## 2019-08-22 DIAGNOSIS — G9050 Complex regional pain syndrome I, unspecified: Secondary | ICD-10-CM

## 2019-08-22 DIAGNOSIS — I2583 Coronary atherosclerosis due to lipid rich plaque: Secondary | ICD-10-CM

## 2019-08-22 DIAGNOSIS — S0990XA Unspecified injury of head, initial encounter: Secondary | ICD-10-CM | POA: Diagnosis not present

## 2019-08-22 DIAGNOSIS — Z7901 Long term (current) use of anticoagulants: Secondary | ICD-10-CM | POA: Diagnosis not present

## 2019-08-22 DIAGNOSIS — R21 Rash and other nonspecific skin eruption: Secondary | ICD-10-CM

## 2019-08-22 DIAGNOSIS — W19XXXA Unspecified fall, initial encounter: Secondary | ICD-10-CM

## 2019-08-22 DIAGNOSIS — I10 Essential (primary) hypertension: Secondary | ICD-10-CM | POA: Diagnosis not present

## 2019-08-22 DIAGNOSIS — G44309 Post-traumatic headache, unspecified, not intractable: Secondary | ICD-10-CM | POA: Diagnosis not present

## 2019-08-22 DIAGNOSIS — R519 Headache, unspecified: Secondary | ICD-10-CM | POA: Diagnosis not present

## 2019-08-22 LAB — CBC WITH DIFFERENTIAL/PLATELET
Basophils Absolute: 0 10*3/uL (ref 0.0–0.1)
Basophils Relative: 0.4 % (ref 0.0–3.0)
Eosinophils Absolute: 0.1 10*3/uL (ref 0.0–0.7)
Eosinophils Relative: 2.9 % (ref 0.0–5.0)
HCT: 37.8 % (ref 36.0–46.0)
Hemoglobin: 12.5 g/dL (ref 12.0–15.0)
Lymphocytes Relative: 35.3 % (ref 12.0–46.0)
Lymphs Abs: 1.3 10*3/uL (ref 0.7–4.0)
MCHC: 33.1 g/dL (ref 30.0–36.0)
MCV: 92.7 fl (ref 78.0–100.0)
Monocytes Absolute: 0.3 10*3/uL (ref 0.1–1.0)
Monocytes Relative: 7.4 % (ref 3.0–12.0)
Neutro Abs: 2 10*3/uL (ref 1.4–7.7)
Neutrophils Relative %: 54 % (ref 43.0–77.0)
Platelets: 155 10*3/uL (ref 150.0–400.0)
RBC: 4.07 Mil/uL (ref 3.87–5.11)
RDW: 13.2 % (ref 11.5–15.5)
WBC: 3.7 10*3/uL — ABNORMAL LOW (ref 4.0–10.5)

## 2019-08-22 MED ORDER — HYDROCODONE-ACETAMINOPHEN 5-325 MG PO TABS: 1.0000 | ORAL_TABLET | Freq: Four times a day (QID) | ORAL | 0 refills | Status: DC | PRN

## 2019-08-22 NOTE — Patient Instructions (Addendum)
GO TO THE LAB : Get the blood work     GO TO THE FRONT DESK Schedule your next appointment   for a checkup in 4 months      STOP BY THE FIRST FLOOR:  get the CAT scan of the head   Continue checking your blood pressure.   BP GOAL is between 110/65 and  140/90. If it is consistently higher or lower, let me know    For the rash on your right leg, apply over-the-counter hydrocortisone 1% cream every night

## 2019-08-22 NOTE — Progress Notes (Signed)
Pre visit review using our clinic review tool, if applicable. No additional management support is needed unless otherwise documented below in the visit note. 

## 2019-08-22 NOTE — Progress Notes (Signed)
Subjective:    Patient ID: [name], female    DOB: 06-Apr-1935, 84 y.o.   MRN: [id number]  DOS:  08/22/2019 Type of visit - description: f/u Here to reassess her blood pressure but has other issues. Good compliance with BP meds, ambulatory BPs consistently in the 130s.  She had a fall 2 weeks ago, landed on her back and right arm.  She also hit her head. No LOC, did not have some "spots" before her visual fields after the fall but they  are largely resolved. She has a history of migraines but since the fall her headaches have been more intense although they respond to Tylenol.   She also has some neck pain since the fall, no new or unusual paresthesias.  Has a rash for the last 2 weeks at the right leg, Dr. Letta Pate thinks could be related to her prosthetics.  Pain management: Request refill on Norco  BP Readings from Last 3 Encounters:  08/22/19 (!) 162/78  08/18/19 (!) 160/93  07/11/19 (!) 165/100       Review of Systems No nausea or vomiting. No diplopia  Past Medical History:  Diagnosis Date   Abnormal echocardiogram    09/2013: mild LVH, mild focal basal hypertrophy of septum, EF 60-65%, normal WM, grade 1 diastolic dysfunction, MAC, mild LAE, ASA, PASP 34. Possible oscillating MV density - reviewed by MD and felt there was no significant abnormality other than MAC noted with mitral valve and did not require TEE.   Adenomatous colon polyp    Allergic rhinitis    Arthritis    "back; ankles; hands; knees" (10/31/2013)   Bilateral sensorineural hearing loss    CAD (coronary artery disease)    a. Nonobst in 2011. b. Abnormal nuc 09/2013 -> s/p cutting balloon to D2, mild LAD disease; c. 08/2015 MV: no ischemia, EF 71%.   Cancer [facility])    cancerous polyps   Carcinoma in situ in a polyp 1994   a. 1994 - malignant polyp removed during colonoscopy.   Charcot's Foot    a.  01/2016 s/p RLE transtibial amputation 2/2 Charcot rocker-bottom deformity and  insensate neuropathy ulceration.   Chronic diastolic CHF (congestive heart failure) ([place])    a. 09/2013 Echo: EF 60-65%, mild LVH, Gr1 DD.   Diverticulosis    DJD (degenerative joint disease)    DVT (deep venous thrombosis) ([place]) 12/2007   Dyspnea    a. Chronic, extensive w/u see OV note 09-2010. b. [place] 2011: ormal with RA 5 RV 31/2 PA 27/12 (19) PCW 10 CO normal. No evidence of shunting with sitting up in cath lab. c. CPX 2011: see report. d. Prior fluoro of diaphragm -  R diaphragm elevated at rest but both moved with inspiration.    Fatty liver    GERD (gastroesophageal reflux disease)    a. Hx GERD/esophageal dysmotility followed by Dr. Olevia Perches.    Head injury due to trauma    Headache    Optic migraine   Hemorrhoids    Hiatal hernia    a. s/p Nissen fundoplication 9449.   Hyperlipidemia    a. patient unwilling to use statins.   Hypertensive heart disease    IBS (irritable bowel syndrome)    Macular degeneration 03/2009   Dr. Rosana Hoes   Myocardial infarction [facility]) 2017   Neuropathy    a. Hands, feet, legs.   Orthostatic hypotension    Osteomyelitis (HCC)    a. Adm 04/2013: Charcot collapse of the right foot  with osteomyelitis and ulceration, s/p excision; b. 01/2016 s/p RLE transtibial amputation 2/2 Charcot rocker-bottom deformity and insensate neuropathy ulceration.   Osteoporosis    PE (pulmonary embolism) 12/2007   a. PE/DVT after neck surgery 2009. b. coumadin d/c 10-2008.   Pneumonia 2015ish   PONV (postoperative nausea and vomiting) 2009   neck surgery   PPD positive    Pulmonary nodule    incidental per CT:  Pet scan 4-9: likely benign, CT 08-2009 no change, no further CTs (Dr. Gwenette Greet)   Recurrent UTI    RSD (reflex sympathetic dystrophy)    a. Chronic pain.   Spinal stenosis    Type II diabetes mellitus ([place])    no on medication   Venous insufficiency    a. Contributing to LEE.    Past Surgical History:  Procedure Laterality Date    AMPUTATION  03/06/2012   Procedure: AMPUTATION FOOT;  Surgeon: Newt Minion, MD;  Location: [place];  Service: Orthopedics;  Laterality: Left;  FIFTH RAY AMPUTATION    AMPUTATION Right 02/18/2016   Procedure: AMPUTATION BELOW KNEE;  Surgeon: Newt Minion, MD;  Location: [place];  Service: Orthopedics;  Laterality: Right;   AMPUTATION Left 11/22/2016   Procedure: Amputation 4th Toe Left Foot at Metatarsophalangeal Joint;  Surgeon: Newt Minion, MD;  Location: [place];  Service: Orthopedics;  Laterality: Left;   ANKLE FUSION  09/27/2012   Procedure: ANKLE FUSION;  Surgeon: Newt Minion, MD;  Location: [place];  Service: Orthopedics;  Laterality: Left;  Left Tibiocalcaneal Fusion   ANKLE FUSION Right 05/09/2013   Procedure: ANKLE FUSION;  Surgeon: Newt Minion, MD;  Location: [place];  Service: Orthopedics;  Laterality: Right;  Excision Osteomyelitis Base 1st MT Right Foot, Fusion Medial Column   ANTERIOR CERVICAL DECOMP/DISCECTOMY FUSION  12/18/07   For OA,  Dr. Lorin Mercy:  fu by a PE   CARDIAC CATHETERIZATION  05/2010    at [place] Left 09/2011   CATARACT EXTRACTION W/ INTRAOCULAR LENS  IMPLANT, BILATERAL  2004   feb 2004 left, aug 2004 right   CHOLECYSTECTOMY  03/2002   CORONARY ANGIOPLASTY  10/31/2013   CORONARY ARTERY BYPASS GRAFT  09/27/2016   CYST REMOVAL HAND  06/2003   FOOT BONE EXCISION Right 06/2009   LAPAROSCOPIC RIGHT HEMI COLECTOMY N/A 01/08/2015   Procedure: LAPAROSCOPIC ASSISTED RIGHT HEMI COLECTOMY;  Surgeon: Johnathan Hausen, MD;  Location: [facility];  Service: General;  Laterality: N/A;   LEFT HEART CATHETERIZATION WITH CORONARY ANGIOGRAM N/A 10/31/2013   Procedure: LEFT HEART CATHETERIZATION WITH CORONARY ANGIOGRAM;  Surgeon: Jettie Booze, MD;  Location[facility] C[facility] LAB;  Service: Cardiovascular;  Laterality: N/A;   NASAL SEPTUM SURGERY  33/8250   NISSEN FUNDOPLICATION  02/22/9766   PERCUTANEOUS CORONARY INTERVENTION-BALLOON ONLY  10/31/2013   Procedure:  PERCUTANEOUS CORONARY INTERVENTION-BALLOON ONLY;  Surgeon: Jettie Booze, MD;  Location: [facility] CATH LAB;  Service: Cardiovascular;;   ROTATOR CUFF REPAIR Right 07/2007   Dr. Morene Rankins CUFF REPAIR Left 11/2004   stent in left leg, behind knee  06/27/2017   x2, done at [facility] cardiology in [place] Right 08/2008   3rd toe, Dr. Sharol Given due to osteomyelitis   VAGINAL HYSTERECTOMY  03/1975   VEIN LIGATION Bilateral 03/1966   VENA CAVA FILTER PLACEMENT  01/2010   green filter; "due to blood clots"   WISDOM TOOTH EXTRACTION  06/2007    Social History  Socioeconomic History   Marital status: Divorced    Spouse name: Not on file   Number of children: 3   Years of education: 15   Highest education level: Not on file  Occupational History   Occupation: Retired     Comment: from social services   Social Needs   Emergency planning/management officer strain: Not on file   Food insecurity    Worry: Not on file    Inability: Not on file   Transportation needs    Medical: Not on file    Non-medical: Not on file  Tobacco Use   Smoking status: Never Smoker   Smokeless tobacco: Never Used   Tobacco comment: tried for a few months in college.   Substance and Sexual Activity   Alcohol use: No    Alcohol/week: 0.0 standard drinks   Drug use: No   Sexual activity: Not Currently    Birth control/protection: Post-menopausal  Lifestyle   Physical activity    Days per week: Not on file    Minutes per session: Not on file   Stress: Not on file  Relationships   Social connections    Talks on phone: Not on file    Gets together: Not on file    Attends religious service: Not on file    Active member of club or organization: Not on file    Attends meetings of clubs or organizations: Not on file    Relationship status: Not on file   Intimate partner violence    Fear of current or ex partner: Not on file    Emotionally abused: Not on file    Physically  abused: Not on file    Forced sexual activity: Not on file  Other Topics Concern   Not on file  Social History Narrative   Daughter lives w/ her and another daughter lives in town   1 son in [place]   Son comes home every 4 weeks to visit   Caffeine use:  Tea 1/day w/ dinner   Coffee occass             Allergies as of 08/22/2019      Reactions   Cymbalta [duloxetine Hcl] Swelling   Swelling in legs   Gabapentin Swelling   Swelling in legs Swelling in legs   Silicone Hives, Itching, Dermatitis, Rash   Metformin And Related Other (See Comments)   dizzy, tired, chills, diarrhea, and nausea   Adhesive [tape] Rash   rash rash   Cefazolin Hives   Hives Hives   Ciprofloxacin Other (See Comments)   Other reaction(s): Other (See Comments) Per pt, caused body aches Per pt, caused body aches   Clorazepate Dipotassium Other (See Comments)   Unknown reaction   Dilaudid [hydromorphone Hcl] Other (See Comments)    confused, intense itching   Doxycycline Rash   Enablex [darifenacin Hydrobromide Er] Other (See Comments)   Hypotension, near syncope   Levofloxacin Other (See Comments)   Causes wrist pain   Lyrica [pregabalin] Swelling   Swelling in legs   Methadone Hcl Other (See Comments)   Reaction unknown   Morphine And Related Other (See Comments)   Confusion, constipation.   Talwin [pentazocine] Other (See Comments)   "climbing walls" anxiety      Medication List       Accurate as of August 22, 2019 11:13 AM. If you have any questions, ask your nurse or doctor.        Alpha-Lipoic Acid 600 MG Caps  Take 600 mg by mouth 2 (two) times daily.   amitriptyline 25 MG tablet Commonly known as: ELAVIL Take 1 tablet (25 mg total) by mouth at bedtime.   atorvastatin 40 MG tablet Commonly known as: LIPITOR Take 40 mg by mouth daily.   carvedilol 25 MG tablet Commonly known as: COREG Take 25 mg by mouth 2 (two) times daily with a meal.   ciclopirox 0.77 % cream Commonly  known as: LOPROX Apply 1 application topically 2 (two) times daily as needed.   clopidogrel 75 MG tablet Commonly known as: PLAVIX Take 75 mg by mouth at bedtime.   diclofenac sodium 1 % Gel Commonly known as: VOLTAREN APPLY 2-4 GRAMS TO AFFECTED AREA 3 TIMES DAILY AS NEEDED   ferrous sulfate 325 (65 FE) MG tablet Commonly known as: FeroSul Take 1 tablet (325 mg total) by mouth 2 (two) times daily with a meal.   freestyle lancets CHECK BLOOD SUGAR TWICE DAILY   FREESTYLE LITE test strip Generic drug: glucose blood USE TO CHECK BLOOD SUGAR NO MORE THAN TWICE DAILY   furosemide 20 MG tablet Commonly known as: LASIX Take 20 mg by mouth daily.   HYDROcodone-acetaminophen 5-325 MG tablet Commonly known as: NORCO/VICODIN Take 1 tablet by mouth every 6 (six) hours as needed for moderate pain.   hydrocortisone 2.5 % rectal cream Commonly known as: ANUSOL-HC Place 1 application rectally 2 (two) times daily.   hydrocortisone 25 MG suppository Commonly known as: ANUSOL-HC Place 1 suppository (25 mg total) rectally every 12 (twelve) hours.   lidocaine 5 % Commonly known as: Lidoderm Place 1 patch onto the skin daily. Remove & Discard patch within 12 hours or as directed by MD   meclizine 25 MG tablet Commonly known as: ANTIVERT Take 1 tablet (25 mg total) by mouth 2 (two) times daily as needed for dizziness.   methenamine 1 g tablet Commonly known as: HIPREX Take 1 tablet by mouth 2 (two) times daily.   nitroGLYCERIN 0.4 MG SL tablet Commonly known as: NITROSTAT Place 0.4 mg under the tongue every 5 (five) minutes as needed for chest pain (x 3 doses). Reported on 04/14/2016   omeprazole 20 MG capsule Commonly known as: PRILOSEC Take 1 capsule (20 mg total) by mouth 2 (two) times daily.   ondansetron 4 MG disintegrating tablet Commonly known as: ZOFRAN-ODT TAKE 1 TABLET BY MOUTH EVERY 8 HOURS AS NEEDED FOR NAUSEA OR VOMITING   OVER THE COUNTER MEDICATION 1 tablet 3  (three) times daily. Berberorine Gluco Defense   OVER THE COUNTER MEDICATION Take 1 capsule by mouth 2 (two) times daily. Integrative Digestive Formula   pramipexole 0.125 MG tablet Commonly known as: MIRAPEX Take 1-2 tablets (0.125-0.25 mg total) by mouth at bedtime as needed.   PreserVision AREDS 2 Caps Take 1 capsule by mouth 2 (two) times daily.   REFRESH OP Place 1 drop into both eyes 3 (three) times daily as needed (dry eyes).   Vitamin B-12 500 MCG Subl Place 500 mcg under the tongue daily.   Vitamin D3 125 MCG (5000 UT) Caps Take 5,000 Units by mouth every evening. Reported on 11/24/2015   Xarelto 2.5 MG Tabs tablet Generic drug: rivaroxaban Take 2.5 mg by mouth 2 (two) times daily.           Objective:   Physical Exam Skin:        BP (!) 162/78 (BP Location: Left Arm, Patient Position: Sitting, Cuff Size: Normal)    Pulse 86  Temp (!) 95.4 F (35.2 C) (Temporal)    Resp 18    Ht _0  (1.6 m)    Wt 170 lb (77.1 kg) Comment: per Pt   SpO2 100%    BMI 30.11 kg/m  General:   Well developed, NAD, BMI noted. HEENT:  Normocephalic . Face symmetric, atraumatic Neck: No TTP on the cervical spine, ROM slightly decreased due to pain. Lungs:  CTA B Normal respiratory effort, no intercostal retractions, no accessory muscle use. Heart: RRR,  no murmur.  No pretibial edema bilaterally  Neurologic:  alert & oriented X3.  Speech normal, gait: Assisted by her prosthesis and rolling walker.  Move extremities well. Psych--  Cognition and judgment appear intact.  Cooperative with normal attention span and concentration.  Behavior appropriate. No anxious or depressed appearing.      Assessment     Assessment DM, Neuropathy, amputations due to Charcot feet. - metformin intolerant  (lethargic) HTN  Hyperlipidemia LUNG MASS -incidental per CT 2009,  PET scan 01-2008: likely benign, CT 08-2009 no change, no further CTs per Dr Gwenette Greet - Had a CT in [place]  09-2016 "spiculated mass", saw Dr Lamonte Sakai,  CT  09/2017 stable CV: ---CAD sees Dr Meda Coffee and a cardiologist in [facility] --NSTEMI 09-2016. Outside hospital: Cath 2 vessel disease, CABG 2, LIMA to LAD and Diag   ---Chronic diastolic CHF ---PVD: Aortogram and a stent L Leg Dr Feliberto Gottron, 06-27-2017 @ [facility] (per pt) GI: GERD, IBS, colon polyps, PUD, abdominal pain "post polypectomy syndrome" naueas meds prn (antivert-zofran, gets nausea when car traveling) Osteoporosis  MSK,pain mngmt : --Reflex sympathetic dystrophy --Neuropathy --DJD --Spinal stenosis  --H/o Osteomyelitis  Foot --s/p amputation:  4th 5th L toes , R BKA  (01-2016, dx osteomyelitis) w/ phantom pain  -- sees Dr Sharol Given prn Takes nortriptyline also for leg cramps. She had local injections with Dr. Maia Petties 2016 w/o apparent help. She took oxycodone after surgery and that did NOT seem to help better than hydrocodone. Intolerant to Cymbalta, Neurontin ; Lyrica helped but caused edema. Recurrent UTIs: Dr Junious Silk  Venous insufficiency  H/o  + PPD H/o hypothyroidism: TSHs stable H/o dyspnea  PLAN:  HTN: Good compliance with carvedilol and Lasix, ambulatory BP consistently in the 130s.  BP today slightly elevated.  Recommend no change for now.  Check CBC. Rash: Right leg, probably irritation from prosthesis, recommend OTC hydrocortisone 1% at night. Head contusion: [place] 2 weeks ago, I am concerned because she is anticoagulated, also on Plavix and since the fall she has more intense headaches.  No nausea or vomiting.  Mild  neck pain.  We will get a CT head to rule out bleeding. RSD, neuropathy, DJD: Refill pain medication 1 month at a time. Imbalance, frequent falls, amputations: Was evaluated by physical medicine, best option to increase balance would be strengthening of lower limbs muscles.  Today I also suggested she look into moving at a assisted living facility or similar site where she, I believe,  will be safer. RTC 4 months

## 2019-08-22 NOTE — Telephone Encounter (Signed)
I called and pt has not been in the office this year and will need the office visit note from her appt sch on 09/15/19 to submit to insurance for coverage of her prosthetic limb. Pt voiced understanding and will call with any questions.

## 2019-08-24 NOTE — Assessment & Plan Note (Addendum)
HTN: Good compliance with carvedilol and Lasix, ambulatory BP consistently in the 130s.  BP today slightly elevated.  Recommend no change for now.  Check CBC. Rash: Right leg, probably irritation from prosthesis, recommend OTC hydrocortisone 1% at night. Head contusion: [place] 2 weeks ago, I am concerned because she is anticoagulated, also on Plavix and since the fall she has more intense headaches.  No nausea or vomiting.  Mild  neck pain.  We will get a CT head to rule out bleeding. RSD, neuropathy, DJD: Refill pain medication 1 month at a time. Imbalance, frequent falls, amputations: Was evaluated by physical medicine, best option to increase balance would be strengthening of lower limbs muscles.  Today I also suggested she look into moving at a assisted living facility or similar site where she, I believe,  will be safer. RTC 4 months

## 2019-08-25 DIAGNOSIS — I70213 Atherosclerosis of native arteries of extremities with intermittent claudication, bilateral legs: Secondary | ICD-10-CM | POA: Diagnosis not present

## 2019-08-26 DIAGNOSIS — G894 Chronic pain syndrome: Secondary | ICD-10-CM | POA: Diagnosis not present

## 2019-08-26 DIAGNOSIS — M545 Low back pain: Secondary | ICD-10-CM | POA: Diagnosis not present

## 2019-08-26 DIAGNOSIS — M25522 Pain in left elbow: Secondary | ICD-10-CM | POA: Diagnosis not present

## 2019-08-26 DIAGNOSIS — M6281 Muscle weakness (generalized): Secondary | ICD-10-CM | POA: Diagnosis not present

## 2019-08-26 DIAGNOSIS — M25562 Pain in left knee: Secondary | ICD-10-CM | POA: Diagnosis not present

## 2019-08-26 DIAGNOSIS — M25512 Pain in left shoulder: Secondary | ICD-10-CM | POA: Diagnosis not present

## 2019-08-26 DIAGNOSIS — G8921 Chronic pain due to trauma: Secondary | ICD-10-CM | POA: Diagnosis not present

## 2019-08-26 DIAGNOSIS — M542 Cervicalgia: Secondary | ICD-10-CM | POA: Diagnosis not present

## 2019-08-26 DIAGNOSIS — R2689 Other abnormalities of gait and mobility: Secondary | ICD-10-CM | POA: Diagnosis not present

## 2019-08-26 DIAGNOSIS — G8929 Other chronic pain: Secondary | ICD-10-CM | POA: Diagnosis not present

## 2019-08-28 DIAGNOSIS — G8929 Other chronic pain: Secondary | ICD-10-CM | POA: Diagnosis not present

## 2019-08-28 DIAGNOSIS — M25512 Pain in left shoulder: Secondary | ICD-10-CM | POA: Diagnosis not present

## 2019-08-28 DIAGNOSIS — M545 Low back pain: Secondary | ICD-10-CM | POA: Diagnosis not present

## 2019-08-28 DIAGNOSIS — G8921 Chronic pain due to trauma: Secondary | ICD-10-CM | POA: Diagnosis not present

## 2019-08-28 DIAGNOSIS — G894 Chronic pain syndrome: Secondary | ICD-10-CM | POA: Diagnosis not present

## 2019-08-28 DIAGNOSIS — M25562 Pain in left knee: Secondary | ICD-10-CM | POA: Diagnosis not present

## 2019-08-28 DIAGNOSIS — M25522 Pain in left elbow: Secondary | ICD-10-CM | POA: Diagnosis not present

## 2019-08-28 DIAGNOSIS — M542 Cervicalgia: Secondary | ICD-10-CM | POA: Diagnosis not present

## 2019-08-28 DIAGNOSIS — M6281 Muscle weakness (generalized): Secondary | ICD-10-CM | POA: Diagnosis not present

## 2019-08-28 DIAGNOSIS — R2689 Other abnormalities of gait and mobility: Secondary | ICD-10-CM | POA: Diagnosis not present

## 2019-08-29 DIAGNOSIS — I25728 Atherosclerosis of autologous artery coronary artery bypass graft(s) with other forms of angina pectoris: Secondary | ICD-10-CM | POA: Diagnosis not present

## 2019-08-29 DIAGNOSIS — I70213 Atherosclerosis of native arteries of extremities with intermittent claudication, bilateral legs: Secondary | ICD-10-CM | POA: Diagnosis not present

## 2019-09-02 ENCOUNTER — Other Ambulatory Visit: Payer: Self-pay | Admitting: *Deleted

## 2019-09-02 DIAGNOSIS — G894 Chronic pain syndrome: Secondary | ICD-10-CM | POA: Diagnosis not present

## 2019-09-02 DIAGNOSIS — G8929 Other chronic pain: Secondary | ICD-10-CM | POA: Diagnosis not present

## 2019-09-02 DIAGNOSIS — M25512 Pain in left shoulder: Secondary | ICD-10-CM | POA: Diagnosis not present

## 2019-09-02 DIAGNOSIS — M545 Low back pain: Secondary | ICD-10-CM | POA: Diagnosis not present

## 2019-09-02 DIAGNOSIS — M25562 Pain in left knee: Secondary | ICD-10-CM | POA: Diagnosis not present

## 2019-09-02 DIAGNOSIS — M25522 Pain in left elbow: Secondary | ICD-10-CM | POA: Diagnosis not present

## 2019-09-02 DIAGNOSIS — M542 Cervicalgia: Secondary | ICD-10-CM | POA: Diagnosis not present

## 2019-09-02 DIAGNOSIS — R110 Nausea: Secondary | ICD-10-CM

## 2019-09-02 DIAGNOSIS — G8921 Chronic pain due to trauma: Secondary | ICD-10-CM | POA: Diagnosis not present

## 2019-09-02 DIAGNOSIS — R2689 Other abnormalities of gait and mobility: Secondary | ICD-10-CM | POA: Diagnosis not present

## 2019-09-02 DIAGNOSIS — M6281 Muscle weakness (generalized): Secondary | ICD-10-CM | POA: Diagnosis not present

## 2019-09-02 MED ORDER — ONDANSETRON 4 MG PO TBDP: ORAL_TABLET | ORAL | 0 refills | Status: DC

## 2019-09-04 DIAGNOSIS — M542 Cervicalgia: Secondary | ICD-10-CM | POA: Diagnosis not present

## 2019-09-04 DIAGNOSIS — G8929 Other chronic pain: Secondary | ICD-10-CM | POA: Diagnosis not present

## 2019-09-04 DIAGNOSIS — M545 Low back pain: Secondary | ICD-10-CM | POA: Diagnosis not present

## 2019-09-04 DIAGNOSIS — M25522 Pain in left elbow: Secondary | ICD-10-CM | POA: Diagnosis not present

## 2019-09-04 DIAGNOSIS — G894 Chronic pain syndrome: Secondary | ICD-10-CM | POA: Diagnosis not present

## 2019-09-04 DIAGNOSIS — M6281 Muscle weakness (generalized): Secondary | ICD-10-CM | POA: Diagnosis not present

## 2019-09-04 DIAGNOSIS — M25512 Pain in left shoulder: Secondary | ICD-10-CM | POA: Diagnosis not present

## 2019-09-04 DIAGNOSIS — G8921 Chronic pain due to trauma: Secondary | ICD-10-CM | POA: Diagnosis not present

## 2019-09-04 DIAGNOSIS — M25562 Pain in left knee: Secondary | ICD-10-CM | POA: Diagnosis not present

## 2019-09-04 DIAGNOSIS — R2689 Other abnormalities of gait and mobility: Secondary | ICD-10-CM | POA: Diagnosis not present

## 2019-09-08 ENCOUNTER — Other Ambulatory Visit: Payer: Self-pay | Admitting: Gastroenterology

## 2019-09-09 DIAGNOSIS — M542 Cervicalgia: Secondary | ICD-10-CM | POA: Diagnosis not present

## 2019-09-09 DIAGNOSIS — M25512 Pain in left shoulder: Secondary | ICD-10-CM | POA: Diagnosis not present

## 2019-09-09 DIAGNOSIS — G8921 Chronic pain due to trauma: Secondary | ICD-10-CM | POA: Diagnosis not present

## 2019-09-09 DIAGNOSIS — M6281 Muscle weakness (generalized): Secondary | ICD-10-CM | POA: Diagnosis not present

## 2019-09-09 DIAGNOSIS — M25562 Pain in left knee: Secondary | ICD-10-CM | POA: Diagnosis not present

## 2019-09-09 DIAGNOSIS — M25522 Pain in left elbow: Secondary | ICD-10-CM | POA: Diagnosis not present

## 2019-09-09 DIAGNOSIS — G8929 Other chronic pain: Secondary | ICD-10-CM | POA: Diagnosis not present

## 2019-09-09 DIAGNOSIS — G894 Chronic pain syndrome: Secondary | ICD-10-CM | POA: Diagnosis not present

## 2019-09-09 DIAGNOSIS — R2689 Other abnormalities of gait and mobility: Secondary | ICD-10-CM | POA: Diagnosis not present

## 2019-09-09 DIAGNOSIS — M545 Low back pain: Secondary | ICD-10-CM | POA: Diagnosis not present

## 2019-09-12 ENCOUNTER — Telehealth: Payer: Self-pay

## 2019-09-12 DIAGNOSIS — Z20828 Contact with and (suspected) exposure to other viral communicable diseases: Secondary | ICD-10-CM | POA: Diagnosis not present

## 2019-09-12 NOTE — Telephone Encounter (Signed)
Advise patient Steroids are not indicated unless the patient has asthma with wheezing, chest congestion. Most important, is good that she is getting a Covid test but she needs to pay attention to her symptoms: If she feels really sick, has chest pain, difficulty breathing, low oxygen etc. needs to go to the ER regardless of results of the test.

## 2019-09-12 NOTE — Telephone Encounter (Signed)
Spoke w/ [name], Pt's daughter, informed of recommendations. Denise verbalized understanding.

## 2019-09-12 NOTE — Telephone Encounter (Signed)
Copied from [place] [phone]. Topic: General - Inquiry >> Sep 12, 2019  9:30 AM Richardo Priest, NT wrote: Reason for CRM: Pt's daughter called in stating that pt is presenting with flu like symptoms. Pt is going to have a covid test and will have results sent to PCP. Pt's is presenting with aches and cough congestion. Pt's daughter would like a steroid to be called in as a precaution in case she does have covid. Pt's daughter would like a call back regardless if this can be done or not. Please advise.

## 2019-09-15 ENCOUNTER — Ambulatory Visit: Payer: Medicare Other | Admitting: Orthopedic Surgery

## 2019-09-23 ENCOUNTER — Telehealth: Payer: Self-pay | Admitting: Internal Medicine

## 2019-09-23 NOTE — Telephone Encounter (Signed)
Hydrocodone refill.   Last OV: 08/22/2019  Last Fill: 08/22/2019 #120 and 0RF Pt sig: 1 tab q6h prn UDS: 07/11/2019 Low risk

## 2019-09-23 NOTE — Telephone Encounter (Signed)
Requested medication (s) are due for refill today: yes  Requested medication (s) are on the active medication list: yes  Last refill:  08/22/2019  Future visit scheduled: yes  Notes to clinic: refill cannot be delegated  Review for refill   Requested Prescriptions  Pending Prescriptions Disp Refills   HYDROcodone-acetaminophen (NORCO/VICODIN) 5-325 MG tablet 120 tablet 0    Sig: Take 1 tablet by mouth every 6 (six) hours as needed for moderate pain.     Not Delegated - Analgesics:  Opioid Agonist Combinations Failed - 09/23/2019  1:45 PM      Failed - This refill cannot be delegated      Passed - Urine Drug Screen completed in last 360 days.      Passed - Valid encounter within last 6 months    Recent Outpatient Visits          1 month ago Fall, initial Loss adjuster, chartered at [facility], MD   2 months ago Essential hypertension   [facility] at [facility], MD   7 months ago Essential hypertension   [facility] at [facility], MD   1 year ago Essential hypertension   [facility] at [facility], MD   1 year ago Essential hypertension   [facility] at [facility], MD      Future Appointments            In 2 months Larose Kells, Alda Berthold, MD [employer] at [place]

## 2019-09-23 NOTE — Telephone Encounter (Signed)
Medication Refill - Medication: HYDROcodone-acetaminophen (NORCO/VICODIN) 5-325 MG tablet    Has the patient contacted their pharmacy? Yes.   (Agent: If no, request that the patient contact the pharmacy for the refill.) (Agent: If yes, when and what did the pharmacy advise?)  Preferred Pharmacy (with phone number or street name):  [facility] [place], [address] [place] [address]  Phone: [phone] Fax: [phone]  Not a 24 hour pharmacy; exact hours not known.     Agent: Please be advised that RX refills may take up to 3 business days. We ask that you follow-up with your pharmacy.

## 2019-09-24 DIAGNOSIS — G8921 Chronic pain due to trauma: Secondary | ICD-10-CM | POA: Diagnosis not present

## 2019-09-24 DIAGNOSIS — M545 Low back pain: Secondary | ICD-10-CM | POA: Diagnosis not present

## 2019-09-24 DIAGNOSIS — M25562 Pain in left knee: Secondary | ICD-10-CM | POA: Diagnosis not present

## 2019-09-24 DIAGNOSIS — G8929 Other chronic pain: Secondary | ICD-10-CM | POA: Diagnosis not present

## 2019-09-24 DIAGNOSIS — G894 Chronic pain syndrome: Secondary | ICD-10-CM | POA: Diagnosis not present

## 2019-09-24 DIAGNOSIS — M25512 Pain in left shoulder: Secondary | ICD-10-CM | POA: Diagnosis not present

## 2019-09-24 DIAGNOSIS — R2689 Other abnormalities of gait and mobility: Secondary | ICD-10-CM | POA: Diagnosis not present

## 2019-09-24 DIAGNOSIS — M6281 Muscle weakness (generalized): Secondary | ICD-10-CM | POA: Diagnosis not present

## 2019-09-24 DIAGNOSIS — M25522 Pain in left elbow: Secondary | ICD-10-CM | POA: Diagnosis not present

## 2019-09-24 DIAGNOSIS — M542 Cervicalgia: Secondary | ICD-10-CM | POA: Diagnosis not present

## 2019-09-24 MED ORDER — HYDROCODONE-ACETAMINOPHEN 5-325 MG PO TABS: 1.0000 | ORAL_TABLET | Freq: Four times a day (QID) | ORAL | 0 refills | Status: DC | PRN

## 2019-09-24 NOTE — Telephone Encounter (Signed)
Sent!

## 2019-09-25 DIAGNOSIS — N3289 Other specified disorders of bladder: Secondary | ICD-10-CM | POA: Diagnosis not present

## 2019-09-25 DIAGNOSIS — I70213 Atherosclerosis of native arteries of extremities with intermittent claudication, bilateral legs: Secondary | ICD-10-CM | POA: Diagnosis not present

## 2019-09-25 DIAGNOSIS — E119 Type 2 diabetes mellitus without complications: Secondary | ICD-10-CM | POA: Diagnosis not present

## 2019-09-25 DIAGNOSIS — I25728 Atherosclerosis of autologous artery coronary artery bypass graft(s) with other forms of angina pectoris: Secondary | ICD-10-CM | POA: Diagnosis not present

## 2019-09-25 DIAGNOSIS — E782 Mixed hyperlipidemia: Secondary | ICD-10-CM | POA: Diagnosis not present

## 2019-09-25 DIAGNOSIS — R002 Palpitations: Secondary | ICD-10-CM | POA: Diagnosis not present

## 2019-09-26 ENCOUNTER — Ambulatory Visit (INDEPENDENT_AMBULATORY_CARE_PROVIDER_SITE_OTHER): Payer: Medicare Other | Admitting: Physician Assistant

## 2019-09-26 ENCOUNTER — Encounter: Payer: Self-pay | Admitting: Physician Assistant

## 2019-09-26 ENCOUNTER — Other Ambulatory Visit: Payer: Self-pay

## 2019-09-26 VITALS — Ht 63.0 in | Wt 170.0 lb

## 2019-09-26 DIAGNOSIS — Z89511 Acquired absence of right leg below knee: Secondary | ICD-10-CM | POA: Diagnosis not present

## 2019-09-26 DIAGNOSIS — I2583 Coronary atherosclerosis due to lipid rich plaque: Secondary | ICD-10-CM

## 2019-09-26 DIAGNOSIS — I2510 Atherosclerotic heart disease of native coronary artery without angina pectoris: Secondary | ICD-10-CM

## 2019-09-26 NOTE — Progress Notes (Signed)
Office Visit Note   Patient: [name]           Date of Birth: January 08, 1935           MRN: [id number] Visit Date: 09/26/2019              Requested by: Colon Branch, [address] [place],  [place] [id number] PCP: Colon Branch, MD  Chief Complaint  Patient presents with  . Right Leg - Follow-up    HX RBKA      HPI: This is a pleasant 84 year old woman who is accompanied by her grandson she is a history of a red light right below-knee amputation and a left ankle fusion she wears an ankle brace on the left and she states it is no longer fitting properly and her foot is rolling in on the right side she is beginning to get skin tears and having to wear 3-5 socks because her prosthetic has become loose she is asking if she can have a new prosthetic made as well as perhaps an updated brace that would fit her more properly  Assessment & Plan: Visit Diagnoses: No diagnosis found.  Plan: I have given her a prescription for a new prosthetic.  I have also given her a prescription to have her left brace evaluated to see if it could be more plantar-grade making her more stable she may follow-up as needed  Follow-Up Instructions: No follow-ups on file.   Ortho Exam  Patient is alert, oriented, no adenopathy, well-dressed, normal affect, normal respiratory effort. Right lower extremity status below-knee amputation she does have some very minor skin tears there is no surrounding erythema.  I can fit 2 fingers in her prosthetic limb and is on her leg with her socks  On her left ankle her brace is worn and the attached shoe is breaking down especially laterally  Imaging: No results found. No images are attached to the encounter.  Labs: Lab Results  Component Value Date   HGBA1C 6.3 07/11/2019   HGBA1C 6.2 02/28/2019   HGBA1C 6.5 05/20/2018   ESRSEDRATE 9 06/12/2014   ESRSEDRATE 12 05/13/2014   REPTSTATUS 06/10/2014 FINAL 06/08/2014   CULT NO GROWTH Performed at  [facility] 06/08/2014   [place] 09/18/2016     Lab Results  Component Value Date   ALBUMIN 4.2 07/11/2019   ALBUMIN 4.3 02/28/2019   ALBUMIN 3.8 01/18/2018    Lab Results  Component Value Date   MG 1.5 03/30/2016   Lab Results  Component Value Date   VD25OH 65 10/20/2011    No results found for: PREALBUMIN CBC EXTENDED Latest Ref Rng & Units 08/22/2019 02/28/2019 08/21/2018  WBC 4.0 - 10.5 K/uL 3.7(L) 4.0 4.2  RBC 3.87 - 5.11 Mil/uL 4.07 4.06 4.41  HGB 12.0 - 15.0 g/dL 12.5 12.7 12.5  HCT 36.0 - 46.0 % 37.8 38.3 38.1  PLT 150.0 - 400.0 K/uL 155.0 148.0(L) 186.0  NEUTROABS 1.4 - 7.7 K/uL 2.0 2.3 2.5  LYMPHSABS 0.7 - 4.0 K/uL 1.3 1.4 1.2     Body mass index is 30.11 kg/m.  Orders:  No orders of the defined types were placed in this encounter.  No orders of the defined types were placed in this encounter.    Procedures: No procedures performed  Clinical Data: No additional findings.  ROS:  All other systems negative, except as noted in the HPI. Review of Systems  Objective: Vital Signs: Ht 5'  3" (1.6 m)   Wt 170 lb (77.1 kg)   BMI 30.11 kg/m   Specialty Comments:  No specialty comments available.  PMFS History: Patient Active Problem List   Diagnosis Date Noted  . External hemorrhoids 02/15/2018  . History of three vessel coronary artery bypass - 2017 , in [facility] 09/25/2017  . PVD (peripheral vascular disease) ([place])-- stent L leg 06-2017 Dr Feliberto [facility] 07/23/2017  . Phantom pain ([place]) 04/01/2017  . Constipation, chronic 04/01/2017  . S/P unilateral BKA (below knee amputation), right ([place]) 12/14/2016  . Toe osteomyelitis, left ([place]) 11/17/2016  . Acute on chronic diastolic CHF (congestive heart failure), NYHA class 3 ([place]) 03/30/2016  . Chest pain 03/30/2016  . Coronary artery disease due to lipid rich plaque 03/30/2016  . Statin intolerance 03/30/2016  . Acute renal failure superimposed on stage 3 chronic kidney disease  ([place]) 03/30/2016  . S/P below knee amputation ([place]) 02/18/2016  . Head trauma 11/26/2015  . Memory loss 11/26/2015  . PCP NOTES >>>>>>>>>>>>>>>>>>>> 10/18/2015  . S/P partial colectomy 01/08/2015  . 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 Anemia 01/18/2014  . Rectal bleeding 10/31/2013  . CAD (coronary artery disease)   . Carcinoma in situ in a polyp   . Depression 09/17/2013  . Charcot's joint 08/21/2013  . Foot osteomyelitis, right ([place]) 07/16/2013  . Chronic pain associated with significant psychosocial dysfunction 05/06/2013  . Neuropathy   . Hyperthyroidism   . Lower extremity edema 03/08/2011  . Annual physical exam 03/08/2011  . ALLERGIC RHINITIS 04/15/2010  . ORTHOSTATIC DIZZINESS 04/13/2010  . HIATAL HERNIA 12/06/2009  . GASTRIC ULCER, HX OF 12/06/2009  . DYSPNEA 03/18/2009  . DEGENERATIVE JOINT DISEASE 06/03/2008  . COLONIC POLYPS 01/27/2008  . DIVERTICULOSIS, COLON 01/27/2008  . IRRITABLE BOWEL SYNDROME, HX OF 01/27/2008  . Pulmonary nodule, right 01/22/2008  . Hyperlipidemia 12/30/2007  . Osteoporosis 12/30/2007  . TB SKIN TEST, POSITIVE 08/09/2007  . DM type 2 with diabetic peripheral neuropathy ([place]) 05/09/2007  . Reflex sympathetic dystrophy-- PAIN MNGMT  05/09/2007  . Essential hypertension 04/15/2007  . GERD 04/15/2007   Past Medical History:  Diagnosis Date  . Abnormal echocardiogram    09/2013: mild LVH, mild focal basal hypertrophy of septum, EF 60-65%, normal WM, grade 1 diastolic dysfunction, MAC, mild LAE, ASA, PASP 34. Possible oscillating MV density - reviewed by MD and felt there was no significant abnormality other than MAC noted with mitral valve and did not require TEE.  . Adenomatous colon polyp   . Allergic rhinitis   . Arthritis    "back; ankles; hands; knees" (10/31/2013)  . Bilateral sensorineural hearing loss   . CAD (coronary artery disease)    a. Nonobst in 2011. b. Abnormal nuc 09/2013 -> s/p cutting  balloon to D2, mild LAD disease; c. 08/2015 MV: no ischemia, EF 71%.  . Cancer ([place])    cancerous polyps  . Carcinoma in situ in a polyp 1994   a. 1994 - malignant polyp removed during colonoscopy.  . Charcot's Foot    a.  01/2016 s/p RLE transtibial amputation 2/2 Charcot rocker-bottom deformity and insensate neuropathy ulceration.  . Chronic diastolic CHF (congestive heart failure) ([place])    a. 09/2013 Echo: EF 60-65%, mild LVH, Gr1 DD.  Marland Kitchen Diverticulosis   . DJD (degenerative joint disease)   . DVT (deep venous thrombosis) ([place]) 12/2007  . Dyspnea    a. Chronic, extensive w/u see OV note 09-2010. b. [place] 2011: ormal with RA  5 RV 31/2 PA 27/12 (19) PCW 10 CO normal. No evidence of shunting with sitting up in cath lab. c. CPX 2011: see report. d. Prior fluoro of diaphragm -  R diaphragm elevated at rest but both moved with inspiration.   . Fatty liver   . GERD (gastroesophageal reflux disease)    a. Hx GERD/esophageal dysmotility followed by Dr. Olevia Perches.   Marland Kitchen Head injury due to trauma   . Headache    Optic migraine  . Hemorrhoids   . Hiatal hernia    a. s/p Nissen fundoplication 5093.  Marland Kitchen Hyperlipidemia    a. patient unwilling to use statins.  . Hypertensive heart disease   . IBS (irritable bowel syndrome)   . Macular degeneration 03/2009   Dr. Rosana Hoes  . Myocardial infarction ([place]) 2017  . Neuropathy    a. Hands, feet, legs.  . Orthostatic hypotension   . Osteomyelitis ([place])    a. 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 01/2016 s/p RLE transtibial amputation 2/2 Charcot rocker-bottom deformity and insensate neuropathy ulceration.  . Osteoporosis   . PE (pulmonary embolism) 12/2007   a. PE/DVT after neck surgery 2009. b. coumadin d/c 10-2008.  Marland Kitchen Pneumonia 2015ish  . PONV (postoperative nausea and vomiting) 2009   neck surgery  . PPD positive   . Pulmonary nodule    incidental per CT:  Pet scan 4-9: likely benign, CT 08-2009 no  change, no further CTs (Dr. Gwenette Greet)  . Recurrent UTI   . RSD (reflex sympathetic dystrophy)    a. Chronic pain.  Marland Kitchen Spinal stenosis   . Type II diabetes mellitus (H[facility])    no on medication  . Venous insufficiency    a. Contributing to LEE.    Family History  Problem Relation Age of Onset  . Heart disease Mother        mitral valve replaced  . Arthritis Mother   . Diabetic kidney disease Daughter   . Diabetes Father   . Stroke Father   . Migraines Sister   . Hypertension Other   . Breast cancer Other   . Headache Sister   . Colon cancer Neg Hx   . Esophageal cancer Neg Hx   . Stomach cancer Neg Hx   . Rectal cancer Neg Hx     Past Surgical History:  Procedure Laterality Date  . AMPUTATION  03/06/2012   Procedure: AMPUTATION FOOT;  Surgeon: Newt Minion, MD;  Location: [facility];  Service: Orthopedics;  Laterality: Left;  FIFTH RAY AMPUTATION   . AMPUTATION Right 02/18/2016   Procedure: AMPUTATION BELOW KNEE;  Surgeon: Newt Minion, MD;  Location: [facility];  Service: Orthopedics;  Laterality: Right;  . AMPUTATION Left 11/22/2016   Procedure: Amputation 4th Toe Left Foot at Metatarsophalangeal Joint;  Surgeon: Newt Minion, MD;  Location: [facility];  Service: Orthopedics;  Laterality: Left;  . ANKLE FUSION  09/27/2012   Procedure: ANKLE FUSION;  Surgeon: Newt Minion, MD;  Location: [facility];  Service: Orthopedics;  Laterality: Left;  Left Tibiocalcaneal Fusion  . ANKLE FUSION Right 05/09/2013   Procedure: ANKLE FUSION;  Surgeon: Newt Minion, MD;  Location: [facility];  Service: Orthopedics;  Laterality: Right;  Excision Osteomyelitis Base 1st MT Right Foot, Fusion Medial Column  . ANTERIOR CERVICAL DECOMP/DISCECTOMY FUSION  12/18/07   For OA,  Dr. Lorin Mercy:  fu by a PE  . CARDIAC CATHETERIZATION  05/2010    at  [place]  . CARPAL TUNNEL RELEASE Left 09/2011  . CATARACT EXTRACTION W/ INTRAOCULAR LENS  IMPLANT, BILATERAL  2004   feb 2004 left, aug 2004 right  . CHOLECYSTECTOMY  03/2002  . CORONARY  ANGIOPLASTY  10/31/2013  . CORONARY ARTERY BYPASS GRAFT  09/27/2016  . CYST REMOVAL HAND  06/2003  . FOOT BONE EXCISION Right 06/2009  . LAPAROSCOPIC RIGHT HEMI COLECTOMY N/A 01/08/2015   Procedure: LAPAROSCOPIC ASSISTED RIGHT HEMI COLECTOMY;  Surgeon: Johnathan Hausen, MD;  Location: [facility];  Service: General;  Laterality: N/A;  . LEFT HEART CATHETERIZATION WITH CORONARY ANGIOGRAM N/A 10/31/2013   Procedure: LEFT HEART CATHETERIZATION WITH CORONARY ANGIOGRAM;  Surgeon: Jettie Booze, MD;  Location: [facility] CATH LAB;  Service: Cardiovascular;  Laterality: N/A;  . NASAL SEPTUM SURGERY  09/1963  . NISSEN FUNDOPLICATION  01/25/8098  . PERCUTANEOUS CORONARY INTERVENTION-BALLOON ONLY  10/31/2013   Procedure: PERCUTANEOUS CORONARY INTERVENTION-BALLOON ONLY;  Surgeon: Jettie Booze, MD;  Location: [facility] C[facility] LAB;  Service: Cardiovascular;;  . ROTATOR CUFF REPAIR Right 07/2007   Dr. Percell Miller  . ROTATOR CUFF REPAIR Left 11/2004  . stent in left leg, behind knee  06/27/2017   x2, done at [facility]  . TOE AMPUTATION Right 08/2008   3rd toe, Dr. Sharol Given due to osteomyelitis  . VAGINAL HYSTERECTOMY  03/1975  . VEIN LIGATION Bilateral 03/1966  . VENA CAVA FILTER PLACEMENT  01/2010   green filter; "due to blood clots"  . WISDOM TOOTH EXTRACTION  06/2007   Social History   Occupational History  . Occupation: Retired     Comment: from Orthoptist   Tobacco Use  . Smoking status: Never Smoker  . Smokeless tobacco: Never Used  . Tobacco comment: tried for a few months in college.   Substance and Sexual Activity  . Alcohol use: No    Alcohol/week: 0.0 standard drinks  . Drug use: No  . Sexual activity: Not Currently    Birth control/protection: Post-menopausal

## 2019-09-30 DIAGNOSIS — M545 Low back pain: Secondary | ICD-10-CM | POA: Diagnosis not present

## 2019-09-30 DIAGNOSIS — R2689 Other abnormalities of gait and mobility: Secondary | ICD-10-CM | POA: Diagnosis not present

## 2019-09-30 DIAGNOSIS — G8929 Other chronic pain: Secondary | ICD-10-CM | POA: Diagnosis not present

## 2019-09-30 DIAGNOSIS — M25522 Pain in left elbow: Secondary | ICD-10-CM | POA: Diagnosis not present

## 2019-09-30 DIAGNOSIS — G8921 Chronic pain due to trauma: Secondary | ICD-10-CM | POA: Diagnosis not present

## 2019-09-30 DIAGNOSIS — M25562 Pain in left knee: Secondary | ICD-10-CM | POA: Diagnosis not present

## 2019-09-30 DIAGNOSIS — M542 Cervicalgia: Secondary | ICD-10-CM | POA: Diagnosis not present

## 2019-09-30 DIAGNOSIS — M25512 Pain in left shoulder: Secondary | ICD-10-CM | POA: Diagnosis not present

## 2019-09-30 DIAGNOSIS — M6281 Muscle weakness (generalized): Secondary | ICD-10-CM | POA: Diagnosis not present

## 2019-09-30 DIAGNOSIS — G894 Chronic pain syndrome: Secondary | ICD-10-CM | POA: Diagnosis not present

## 2019-10-01 DIAGNOSIS — I739 Peripheral vascular disease, unspecified: Secondary | ICD-10-CM | POA: Diagnosis not present

## 2019-10-02 NOTE — Telephone Encounter (Signed)
This encounter was created in error - please disregard.

## 2019-10-29 ENCOUNTER — Telehealth: Payer: Self-pay | Admitting: Internal Medicine

## 2019-10-29 MED ORDER — HYDROCODONE-ACETAMINOPHEN 5-325 MG PO TABS: 1.0000 | ORAL_TABLET | Freq: Four times a day (QID) | ORAL | 0 refills | Status: DC | PRN

## 2019-10-29 NOTE — Telephone Encounter (Signed)
Medication Refill - Medication:  HYDROcodone-acetaminophen (NORCO/VICODIN) 5-325 MG tablet  Has the patient contacted their pharmacy? No. (Agent: If no, request that the patient contact the pharmacy for the refill.) (Agent: If yes, when and what did the pharmacy advise?)  Preferred Pharmacy (with phone number or street name): [facility] in [place]   Agent: Please be advised that RX refills may take up to 3 business days. We ask that you follow-up with your pharmacy.

## 2019-10-29 NOTE — Telephone Encounter (Signed)
Requesting:[place] Contract:none UDS:07/11/2019 Last Visit:08/22/2019 Next Visit:12/15/2019 Last Refill:09/24/2019  Please Advise

## 2019-10-29 NOTE — Telephone Encounter (Signed)
Sent!

## 2019-11-04 DIAGNOSIS — I70213 Atherosclerosis of native arteries of extremities with intermittent claudication, bilateral legs: Secondary | ICD-10-CM | POA: Diagnosis not present

## 2019-11-06 DIAGNOSIS — R2689 Other abnormalities of gait and mobility: Secondary | ICD-10-CM | POA: Diagnosis not present

## 2019-11-06 DIAGNOSIS — G894 Chronic pain syndrome: Secondary | ICD-10-CM | POA: Diagnosis not present

## 2019-11-06 DIAGNOSIS — M25562 Pain in left knee: Secondary | ICD-10-CM | POA: Diagnosis not present

## 2019-11-06 DIAGNOSIS — G8929 Other chronic pain: Secondary | ICD-10-CM | POA: Diagnosis not present

## 2019-11-06 DIAGNOSIS — M25522 Pain in left elbow: Secondary | ICD-10-CM | POA: Diagnosis not present

## 2019-11-06 DIAGNOSIS — M545 Low back pain: Secondary | ICD-10-CM | POA: Diagnosis not present

## 2019-11-06 DIAGNOSIS — G8921 Chronic pain due to trauma: Secondary | ICD-10-CM | POA: Diagnosis not present

## 2019-11-06 DIAGNOSIS — M6281 Muscle weakness (generalized): Secondary | ICD-10-CM | POA: Diagnosis not present

## 2019-11-06 DIAGNOSIS — M25512 Pain in left shoulder: Secondary | ICD-10-CM | POA: Diagnosis not present

## 2019-11-06 DIAGNOSIS — M542 Cervicalgia: Secondary | ICD-10-CM | POA: Diagnosis not present

## 2019-11-11 ENCOUNTER — Other Ambulatory Visit: Payer: Self-pay | Admitting: Neurology

## 2019-11-11 DIAGNOSIS — R110 Nausea: Secondary | ICD-10-CM

## 2019-11-11 MED ORDER — ONDANSETRON 4 MG PO TBDP: ORAL_TABLET | ORAL | 0 refills | Status: DC

## 2019-11-12 DIAGNOSIS — G894 Chronic pain syndrome: Secondary | ICD-10-CM | POA: Diagnosis not present

## 2019-11-12 DIAGNOSIS — M25512 Pain in left shoulder: Secondary | ICD-10-CM | POA: Diagnosis not present

## 2019-11-12 DIAGNOSIS — M545 Low back pain: Secondary | ICD-10-CM | POA: Diagnosis not present

## 2019-11-12 DIAGNOSIS — R2689 Other abnormalities of gait and mobility: Secondary | ICD-10-CM | POA: Diagnosis not present

## 2019-11-12 DIAGNOSIS — M25562 Pain in left knee: Secondary | ICD-10-CM | POA: Diagnosis not present

## 2019-11-12 DIAGNOSIS — M542 Cervicalgia: Secondary | ICD-10-CM | POA: Diagnosis not present

## 2019-11-12 DIAGNOSIS — M25522 Pain in left elbow: Secondary | ICD-10-CM | POA: Diagnosis not present

## 2019-11-12 DIAGNOSIS — G8921 Chronic pain due to trauma: Secondary | ICD-10-CM | POA: Diagnosis not present

## 2019-11-12 DIAGNOSIS — G8929 Other chronic pain: Secondary | ICD-10-CM | POA: Diagnosis not present

## 2019-11-12 DIAGNOSIS — M6281 Muscle weakness (generalized): Secondary | ICD-10-CM | POA: Diagnosis not present

## 2019-11-14 DIAGNOSIS — I70213 Atherosclerosis of native arteries of extremities with intermittent claudication, bilateral legs: Secondary | ICD-10-CM | POA: Diagnosis not present

## 2019-11-14 DIAGNOSIS — I739 Peripheral vascular disease, unspecified: Secondary | ICD-10-CM | POA: Diagnosis not present

## 2019-11-26 ENCOUNTER — Other Ambulatory Visit: Payer: Self-pay

## 2019-11-26 ENCOUNTER — Telehealth: Payer: Self-pay | Admitting: Gastroenterology

## 2019-11-26 ENCOUNTER — Ambulatory Visit (INDEPENDENT_AMBULATORY_CARE_PROVIDER_SITE_OTHER): Payer: Medicare Other | Admitting: Gastroenterology

## 2019-11-26 ENCOUNTER — Encounter: Payer: Self-pay | Admitting: Gastroenterology

## 2019-11-26 VITALS — BP 142/78 | HR 68 | Temp 97.8°F | Ht 64.0 in | Wt 170.0 lb

## 2019-11-26 DIAGNOSIS — K649 Unspecified hemorrhoids: Secondary | ICD-10-CM

## 2019-11-26 DIAGNOSIS — K625 Hemorrhage of anus and rectum: Secondary | ICD-10-CM | POA: Diagnosis not present

## 2019-11-26 DIAGNOSIS — B49 Unspecified mycosis: Secondary | ICD-10-CM

## 2019-11-26 DIAGNOSIS — K581 Irritable bowel syndrome with constipation: Secondary | ICD-10-CM

## 2019-11-26 MED ORDER — HYDROCORTISONE (PERIANAL) 2.5 % EX CREA: 1.0000 "application " | TOPICAL_CREAM | Freq: Two times a day (BID) | CUTANEOUS | 3 refills | Status: DC

## 2019-11-26 MED ORDER — LUBIPROSTONE 24 MCG PO CAPS: 24.0000 ug | ORAL_CAPSULE | Freq: Two times a day (BID) | ORAL | 2 refills | Status: DC

## 2019-11-26 MED ORDER — MICONAZOLE NITRATE 2 % EX CREA: 1.0000 "application " | TOPICAL_CREAM | Freq: Two times a day (BID) | CUTANEOUS | 0 refills | Status: DC

## 2019-11-26 NOTE — Progress Notes (Signed)
[name]    [id number]    12/08/1934  Primary Care Physician:Paz, Alda Berthold, MD  Referring Physician: Colon Branch, MD [address] [name] [address]   Chief complaint:  Rectal bleeding  HPI:  85 year old female with history of CAD, and STEMI status post CABG, peripheral vascular disease status post partial amputation of left lower leg with prosthesis on chronic antiplatelet and anticoagulation here for follow-up visit for evaluation of rectal bleeding on and off for past 2-3 months  Also c/o rectal discomfort and pain, worse with movement and when she defecates  Alternating constipation and diarrhea. No BM for 2-3 days  She is s/p hemorrhoidal band ligation in 2017.  Colonoscopy March 2017 with internal and external hemorrhoids, left-sided diverticulosis, removal of 3 subcentimeter sessile and pedunculated polyps [tubular adenomas]    Outpatient Encounter Medications as of 11/26/2019  Medication Sig  . Alpha-Lipoic Acid 600 MG CAPS Take 600 mg by mouth 2 (two) times daily.  Marland Kitchen amitriptyline (ELAVIL) 25 MG tablet Take 1 tablet (25 mg total) by mouth at bedtime.  Marland Kitchen atorvastatin (LIPITOR) 40 MG tablet Take 40 mg by mouth daily.  . carvedilol (COREG) 25 MG tablet Take 25 mg by mouth 2 (two) times daily with a meal.  . Cholecalciferol (VITAMIN D3) 5000 UNITS CAPS Take 5,000 Units by mouth every evening. Reported on 11/24/2015  . ciclopirox (LOPROX) 0.77 % cream Apply 1 application topically 2 (two) times daily as needed.  . clopidogrel (PLAVIX) 75 MG tablet Take 75 mg by mouth at bedtime.   . Cyanocobalamin (VITAMIN B-12) 500 MCG SUBL Place 500 mcg under the tongue daily.  . diclofenac sodium (VOLTAREN) 1 % GEL APPLY 2-4 GRAMS TO AFFECTED AREA 3 TIMES DAILY AS NEEDED  . ferrous sulfate (FEROSUL) 325 (65 FE) MG tablet Take 1 tablet (325 mg total) by mouth 2 (two) times daily with a meal.  . FREESTYLE LITE test strip USE TO CHECK BLOOD SUGAR NO MORE  THAN TWICE DAILY  . furosemide (LASIX) 20 MG tablet Take 20 mg by mouth daily.  Marland Kitchen HYDROcodone-acetaminophen (NORCO/VICODIN) 5-325 MG tablet Take 1 tablet by mouth every 6 (six) hours as needed for moderate pain.  . hydrocortisone (ANUSOL-HC) 2.5 % rectal cream Place 1 application rectally 2 (two) times daily.  . hydrocortisone (ANUSOL-HC) 25 MG suppository Place 1 suppository (25 mg total) rectally every 12 (twelve) hours.  . Lancets (FREESTYLE) lancets CHECK BLOOD SUGAR TWICE DAILY  . lidocaine (LIDODERM) 5 % Place 1 patch onto the skin daily. Remove & Discard patch within 12 hours or as directed by MD  . meclizine (ANTIVERT) 25 MG tablet Take 1 tablet (25 mg total) by mouth 2 (two) times daily as needed for dizziness.  . methenamine (HIPREX) 1 g tablet Take 1 tablet by mouth 2 (two) times daily.  . Multiple Vitamins-Minerals (PRESERVISION AREDS 2) CAPS Take 1 capsule by mouth 2 (two) times daily.  . nitroGLYCERIN (NITROSTAT) 0.4 MG SL tablet Place 0.4 mg under the tongue every 5 (five) minutes as needed for chest pain (x 3 doses). Reported on 04/14/2016  . omeprazole (PRILOSEC) 20 MG capsule TAKE 1 CAPSULE(20 MG) BY MOUTH TWICE DAILY  . ondansetron (ZOFRAN-ODT) 4 MG disintegrating tablet TAKE 1 TABLET BY MOUTH EVERY 8 HOURS AS NEEDED FOR NAUSEA OR VOMITING  . OVER THE COUNTER MEDICATION Take 1 capsule by mouth 2 (two) times daily. Integrative Digestive Formula  .  OVER THE COUNTER MEDICATION 1 tablet 3 (three) times daily. Berberorine Gluco Defense   . Polyvinyl Alcohol-Povidone (REFRESH OP) Place 1 drop into both eyes 3 (three) times daily as needed (dry eyes).   . pramipexole (MIRAPEX) 0.125 MG tablet Take 1-2 tablets (0.125-0.25 mg total) by mouth at bedtime as needed.  . rivaroxaban (XARELTO) 2.5 MG TABS tablet Take 2.5 mg by mouth 2 (two) times daily.  . [DISCONTINUED] hydrocortisone (ANUSOL-HC) 2.5 % rectal cream Place 1 application rectally 2 (two) times daily.  Marland Kitchen lubiprostone ([name]) 24  MCG capsule Take 1 capsule (24 mcg total) by mouth 2 (two) times daily with a meal.  . miconazole (MICOTIN) 2 % cream Apply 1 application topically 2 (two) times daily. For 4 weeks   No facility-administered encounter medications on file as of 11/26/2019.    Allergies as of 11/26/2019 - Review Complete 09/26/2019  Allergen Reaction Noted  . Cymbalta [duloxetine hcl] Swelling 09/25/2012  . Gabapentin Swelling 09/25/2012  . Silicone Hives, Itching, Dermatitis, and Rash   . Metformin and related Other (See Comments) 12/10/2015  . Adhesive [tape] Rash 07/17/2011  . Cefazolin Hives 03/06/2012  . Ciprofloxacin Other (See Comments) 01/31/2013  . Clorazepate dipotassium Other (See Comments)   . Dilaudid [hydromorphone hcl] Other (See Comments) 03/11/2010  . Doxycycline Rash 11/03/2008  . Enablex [darifenacin hydrobromide er] Other (See Comments) 03/06/2012  . Levofloxacin Other (See Comments) 01/08/2014  . Lyrica [pregabalin] Swelling 05/08/2013  . Methadone hcl Other (See Comments)   . Morphine and related Other (See Comments) 10/31/2013  . Talwin [pentazocine] Other (See Comments) 12/04/2012    Past Medical History:  Diagnosis Date  . Abnormal echocardiogram    09/2013: mild LVH, mild focal basal hypertrophy of septum, EF 60-65%, normal WM, grade 1 diastolic dysfunction, MAC, mild LAE, ASA, PASP 34. Possible oscillating MV density - reviewed by MD and felt there was no significant abnormality other than MAC noted with mitral valve and did not require TEE.  . Adenomatous Mary polyp   . Allergic rhinitis   . Arthritis    "back; ankles; hands; knees" (10/31/2013)  . Bilateral sensorineural hearing loss   . CAD (coronary artery disease)    a. Nonobst in 2011. b. Abnormal nuc 09/2013 -> s/p cutting balloon to D2, mild LAD disease; c. 08/2015 MV: no ischemia, EF 71%.  . Cancer ([place])    cancerous polyps  . Carcinoma in situ in a polyp 1994   a. 1994 - malignant polyp removed during  colonoscopy.  . Charcot's Foot    a.  01/2016 s/p RLE transtibial amputation 2/2 Charcot rocker-bottom deformity and insensate neuropathy ulceration.  . Chronic diastolic CHF (congestive heart failure) ([place])    a. 09/2013 Echo: EF 60-65%, mild LVH, Gr1 DD.  Marland Kitchen Diverticulosis   . DJD (degenerative joint disease)   . DVT (deep venous thrombosis) ([place]) 12/2007  . Dyspnea    a. Chronic, extensive w/u see OV note 09-2010. b. [place] 2011: ormal with RA 5 RV 31/2 PA 27/12 (19) PCW 10 CO normal. No evidence of shunting with sitting up in cath lab. c. CPX 2011: see report. d. Prior fluoro of diaphragm -  R diaphragm elevated at rest but both moved with inspiration.   . Fatty liver   . GERD (gastroesophageal reflux disease)    a. Hx GERD/esophageal dysmotility followed by Dr. Olevia Perches.   Marland Kitchen Head injury due to trauma   . Headache    Optic migraine  . Hemorrhoids   .  Hiatal hernia    a. s/p Nissen fundoplication 5027.  Marland Kitchen Hyperlipidemia    a. patient unwilling to use statins.  . Hypertensive heart disease   . IBS (irritable bowel syndrome)   . Macular degeneration 03/2009   Dr. Rosana Hoes  . Myocardial infarction ([place]) 2017  . Neuropathy    a. Hands, feet, legs.  . Orthostatic hypotension   . Osteomyelitis ([place])    a. 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 01/2016 s/p RLE transtibial amputation 2/2 Charcot rocker-bottom deformity and insensate neuropathy ulceration.  . Osteoporosis   . PE (pulmonary embolism) 12/2007   a. PE/DVT after neck surgery 2009. b. coumadin d/c 10-2008.  Marland Kitchen Pneumonia 2015ish  . PONV (postoperative nausea and vomiting) 2009   neck surgery  . PPD positive   . Pulmonary nodule    incidental per CT:  Pet scan 4-9: likely benign, CT 08-2009 no change, no further CTs (Dr. Gwenette Greet)  . Recurrent UTI   . RSD (reflex sympathetic dystrophy)    a. Chronic pain.  Marland Kitchen Spinal stenosis   . Type II diabetes mellitus (H[facility])    no on  medication  . Venous insufficiency    a. Contributing to LEE.    Past Surgical History:  Procedure Laterality Date  . AMPUTATION  03/06/2012   Procedure: AMPUTATION FOOT;  Surgeon: Newt Minion, MD;  Location: [place] [facility];  Service: Orthopedics;  Laterality: Left;  FIFTH RAY AMPUTATION   . AMPUTATION Right 02/18/2016   Procedure: AMPUTATION BELOW KNEE;  Surgeon: Newt Minion, MD;  Location: [place];  Service: Orthopedics;  Laterality: Right;  . AMPUTATION Left 11/22/2016   Procedure: Amputation 4th Toe Left Foot at Metatarsophalangeal Joint;  Surgeon: Newt Minion, MD;  Location: [place] [facility];  Service: Orthopedics;  Laterality: Left;  . ANKLE FUSION  09/27/2012   Procedure: ANKLE FUSION;  Surgeon: Newt Minion, MD;  Location: [place];  Service: Orthopedics;  Laterality: Left;  Left Tibiocalcaneal Fusion  . ANKLE FUSION Right 05/09/2013   Procedure: ANKLE FUSION;  Surgeon: Newt Minion, MD;  Location: [employer];  Service: Orthopedics;  Laterality: Right;  Excision Osteomyelitis Base 1st MT Right Foot, Fusion Medial Column  . ANTERIOR CERVICAL DECOMP/DISCECTOMY FUSION  12/18/07   For OA,  Dr. Lorin Mercy:  fu by a PE  . CARDIAC CATHETERIZATION  05/2010    at [facility]  . CARPAL TUNNEL RELEASE Left 09/2011  . CATARACT EXTRACTION W/ INTRAOCULAR LENS  IMPLANT, BILATERAL  2004   feb 2004 left, aug 2004 right  . CHOLECYSTECTOMY  03/2002  . CORONARY ANGIOPLASTY  10/31/2013  . CORONARY ARTERY BYPASS GRAFT  09/27/2016  . CYST REMOVAL HAND  06/2003  . FOOT BONE EXCISION Right 06/2009  . LAPAROSCOPIC RIGHT HEMI COLECTOMY N/A 01/08/2015   Procedure: LAPAROSCOPIC ASSISTED RIGHT HEMI COLECTOMY;  Surgeon: Johnathan Hausen, MD;  Location: [facility];  Service: General;  Laterality: N/A;  . LEFT HEART CATHETERIZATION WITH CORONARY ANGIOGRAM N/A 10/31/2013   Procedure: LEFT HEART CATHETERIZATION WITH CORONARY ANGIOGRAM;  Surgeon: Jettie Booze, MD;  Location: [facility] CATH LAB;  Service: Cardiovascular;  Laterality: N/A;  . NASAL SEPTUM  SURGERY  09/1963  . NISSEN FUNDOPLICATION  04/25/1286  . PERCUTANEOUS CORONARY INTERVENTION-BALLOON ONLY  10/31/2013   Procedure: PERCUTANEOUS CORONARY INTERVENTION-BALLOON ONLY;  Surgeon: Jettie Booze, MD;  Location: [facility] CATH LAB;  Service: Cardiovascular;;  . ROTATOR CUFF REPAIR Right 07/2007   Dr. Percell Miller  .  ROTATOR CUFF REPAIR Left 11/2004  . stent in left leg, behind knee  06/27/2017   x2, done at [facility] cardiology in [facility]  . TOE AMPUTATION Right 08/2008   3rd toe, Dr. Sharol Given due to osteomyelitis  . VAGINAL HYSTERECTOMY  03/1975  . VEIN LIGATION Bilateral 03/1966  . VENA CAVA FILTER PLACEMENT  01/2010   green filter; "due to blood clots"  . WISDOM TOOTH EXTRACTION  06/2007    Family History  Problem Relation Age of Onset  . Heart disease Mother        mitral valve replaced  . Arthritis Mother   . Diabetic kidney disease Daughter   . Diabetes Father   . Stroke Father   . Migraines Sister   . Hypertension Other   . Breast cancer Other   . Headache Sister   . Mary cancer Neg Hx   . Esophageal cancer Neg Hx   . Stomach cancer Neg Hx   . Rectal cancer Neg Hx     Social History   Socioeconomic History  . Marital status: Divorced    Spouse name: Not on file  . Number of children: 3  . Years of education: 68  . Highest education level: Not on file  Occupational History  . Occupation: Retired     Comment: from Orth[employer]   Tobacco Use  . Smoking status: Never Smoker  . Smokeless tobacco: Never Used  . Tobacco comment: tried for a few months in college.   Substance and Sexual Activity  . Alcohol use: No    Alcohol/week: 0.0 standard drinks  . Drug use: No  . Sexual activity: Not Currently    Birth control/protection: Post-menopausal  Other Topics Concern  . Not on file  Social History Narrative   Daughter lives w/ her and another daughter lives in town   1 son in [place]   Son comes home every 4 weeks to visit   Caffeine use:  Tea 1/day w/ dinner    Coffee occass          Social Determinants of Health   Financial Resource Strain:   . Difficulty of Paying Living Expenses: Not on file  Food Insecurity:   . Worried About Charity fundraiser in the Last Year: Not on file  . Ran Out of Food in the Last Year: Not on file  Transportation Needs:   . Lack of Transportation (Medical): Not on file  . Lack of Transportation (Non-Medical): Not on file  Physical Activity:   . Days of Exercise per Week: Not on file  . Minutes of Exercise per Session: Not on file  Stress:   . Feeling of Stress : Not on file  Social Connections:   . Frequency of Communication with Friends and Family: Not on file  . Frequency of Social Gatherings with Friends and Family: Not on file  . Attends Religious Services: Not on file  . Active Member of Clubs or Organizations: Not on file  . Attends Archivist Meetings: Not on file  . Marital Status: Not on file  Intimate Partner Violence:   . Fear of Current or Ex-Partner: Not on file  . Emotionally Abused: Not on file  . Physically Abused: Not on file  . Sexually Abused: Not on file      Review of systems:  All other review of systems negative except as mentioned in the HPI.   Physical Exam: Vitals:   11/26/19 0819  BP: Marland Kitchen)  142/78  Pulse: 68  Temp: 97.8 F (36.6 C)   Body mass index is 29.18 kg/m. Gen:      No acute distress HEENT:  EOMI, sclera anicteric Abd:      + bowel sounds; soft, non-tender; no palpable masses, no distension, abdominal wall rash suggestive of tinea Neuro: alert and oriented x 3 Psych: normal mood and affect Rectal exam: Normal anal sphincter tone, no anal fissure or external hemorrhoids.  Hard stool in rectal vault Anoscopy: Grade 2 medium size internal hemorrhoids with evidence of recent bleeding from hemorrhoid and left lateral and right anterior position, no active bleeding, normal dentate line, no visible nodules  Data Reviewed:  Reviewed labs, radiology  imaging, old records and pertinent past GI work up   Assessment and Plan/Recommendations:  85 year old female with CAD s/p CABG, peripheral vascular disease s/p left leg amputation on Plavix and Xarelto here with complaints of intermittent rectal bleeding secondary to small-volume hemorrhage from internal hemorrhoids  Chronic idiopathic constipation: Hard stool in rectal vault Advised patient to do bowel purge with MiraLAX and start [name] 24 mcg twice daily after bowel purge Start Benefiber 1 teaspoon 3 times daily with meals  Internal hemorrhoids: Anusol cream per rectum as needed twice daily We will schedule for hemorrhoidal band ligation if continues to have persistent rectal bleeding  History of adenomatous Mary polyps, due to her age do not recommend continued surveillance colonoscopies are screening for colorectal cancer.  Explained to patient in detail the potential risks and benefits.  The patient was provided an opportunity to ask questions and all were answered. The patient agreed with the plan and demonstrated an understanding of the instructions.  Damaris Hippo , MD    CC: Mary Branch, MD

## 2019-11-26 NOTE — Patient Instructions (Addendum)
Dr Silverio Decamp recommends that you complete a bowel purge (to clean out your bowels). Please do the following: Purchase a bottle of Miralax over the counter as well as a box of 5 mg dulcolax tablets. Take 4 dulcolax tablets. Wait 1 hour. You will then drink 6-8 capfuls of Miralax mixed in an adequate amount of water/juice/gatorade (you may choose which of these liquids to drink) over the next 2-3 hours. You should expect results within 1 to 6 hours after completing the bowel purge.  Take [name] 24 mcg twice daily, we will send in a new prescription  Take Benefiber 1 tablespoon twice a day  We will send hydrocortisone cream and Miconazole to your pharmacy  If you are age 83 or older, your body mass index should be between 23-30. Your Body mass index is 29.18 kg/m. If this is out of the aforementioned range listed, please consider follow up with your Primary Care Provider.  If you are age 51 or younger, your body mass index should be between 19-25. Your Body mass index is 29.18 kg/m. If this is out of the aformentioned range listed, please consider follow up with your Primary Care Provider.    I appreciate the  opportunity to care for you  Thank You   Harl Bowie , MD

## 2019-11-27 ENCOUNTER — Encounter: Payer: Self-pay | Admitting: Gastroenterology

## 2019-11-27 ENCOUNTER — Telehealth: Payer: Self-pay

## 2019-11-27 NOTE — Telephone Encounter (Signed)
   [facility] Pre-operative Risk Assessment     Request for surgical clearance:       What type of surgery is being performed?   Hemorrhoid banding  When is this surgery scheduled?    12/29/19  What type of clearance is required ?   Pharmacy  Are there any medications that need to be held prior to surgery and how long?   Plavix (clopidogrel) Xarelto (rivaroxaban)  Practice name and name of physician performing surgery?    Dr Harl Bowie, MD   [facility] [phone]   [phone]  Anesthesia type? Local

## 2019-11-27 NOTE — Telephone Encounter (Signed)
I did a prior authorization for patient's amitiza 24 mcg capsules, one BID for her chronic constipation K59.09 thru [employer]. This has been approved from 10/28/2019-11/26/2020. Information faxed to the [facility] in [facility] with approval dates. Fax # [phone].

## 2019-11-27 NOTE — Telephone Encounter (Signed)
Called patient's daughter [name], addressed her concerns.  Discussed in detail with her the reason for not proceeding with continued surveillance colonoscopy or screening for colorectal cancer.  We stop doing preventative exams after age 85.  She would like to proceed with hemorrhoidal band ligation.  Please schedule next available appointment after 2 weeks.  We will need to get clearance from her cardiologist to hold Plavix and Xarelto prior to the procedure.  Thanks

## 2019-11-28 ENCOUNTER — Telehealth: Payer: Self-pay | Admitting: Internal Medicine

## 2019-11-28 MED ORDER — HYDROCODONE-ACETAMINOPHEN 5-325 MG PO TABS: 1.0000 | ORAL_TABLET | Freq: Four times a day (QID) | ORAL | 0 refills | Status: DC | PRN

## 2019-11-28 NOTE — Telephone Encounter (Signed)
Medication: HYDROcodone-acetaminophen (NORCO/VICODIN) 5-325 MG tablet [id number]    Has the patient contacted their pharmacy? Yes.     Preferred [facility], [address] [place], [place] [address]  Phone:  [phone] Fax:  [phone]     Agent: Please be advised that RX refills may take up to 3 business days. We ask that you follow-up with your pharmacy.

## 2019-11-28 NOTE — Telephone Encounter (Signed)
Sent, PDMP okay 

## 2019-11-28 NOTE — Telephone Encounter (Signed)
Hydrocodone refill.   Last OV: 08/22/2019 Last Fill: 10/29/2019 #120 and 0RF Pt sig: 1 tab q6h prn UDS: 07/11/2019 Low risk

## 2019-12-01 NOTE — Telephone Encounter (Signed)
Spoke with Debbora Dus. Per Cardiology the patient will hold the Plavix 12/17/19 and Xarelto will be held starting 12/19/19.

## 2019-12-03 DIAGNOSIS — R2689 Other abnormalities of gait and mobility: Secondary | ICD-10-CM | POA: Diagnosis not present

## 2019-12-03 DIAGNOSIS — M545 Low back pain: Secondary | ICD-10-CM | POA: Diagnosis not present

## 2019-12-03 DIAGNOSIS — G8921 Chronic pain due to trauma: Secondary | ICD-10-CM | POA: Diagnosis not present

## 2019-12-03 DIAGNOSIS — G8929 Other chronic pain: Secondary | ICD-10-CM | POA: Diagnosis not present

## 2019-12-03 DIAGNOSIS — M25522 Pain in left elbow: Secondary | ICD-10-CM | POA: Diagnosis not present

## 2019-12-03 DIAGNOSIS — G894 Chronic pain syndrome: Secondary | ICD-10-CM | POA: Diagnosis not present

## 2019-12-03 DIAGNOSIS — M542 Cervicalgia: Secondary | ICD-10-CM | POA: Diagnosis not present

## 2019-12-03 DIAGNOSIS — M6281 Muscle weakness (generalized): Secondary | ICD-10-CM | POA: Diagnosis not present

## 2019-12-03 DIAGNOSIS — M25512 Pain in left shoulder: Secondary | ICD-10-CM | POA: Diagnosis not present

## 2019-12-03 DIAGNOSIS — M25562 Pain in left knee: Secondary | ICD-10-CM | POA: Diagnosis not present

## 2019-12-04 ENCOUNTER — Telehealth: Payer: Self-pay | Admitting: Internal Medicine

## 2019-12-04 MED ORDER — FREESTYLE LITE TEST VI STRP: ORAL_STRIP | 12 refills | Status: DC

## 2019-12-04 NOTE — Telephone Encounter (Signed)
Test strips sent to [facility].

## 2019-12-04 NOTE — Telephone Encounter (Incomplete)
MedicationFREESTYLE LITE test strip  :   Has the patient contacted their pharmacy? Yes.   (If no, request that the patient contact the pharmacy for the refill.) (If yes, when and what did the pharmacy advise?)  Preferred Pharmacy (with phone number or street name): [facility](unknown address or phone number [email or web address]  Agent: Please be advised that RX refills may take up to 3 business days. We ask that you follow-up with your pharmacy.

## 2019-12-05 DIAGNOSIS — M25562 Pain in left knee: Secondary | ICD-10-CM | POA: Diagnosis not present

## 2019-12-05 DIAGNOSIS — M545 Low back pain: Secondary | ICD-10-CM | POA: Diagnosis not present

## 2019-12-05 DIAGNOSIS — G8929 Other chronic pain: Secondary | ICD-10-CM | POA: Diagnosis not present

## 2019-12-05 DIAGNOSIS — M542 Cervicalgia: Secondary | ICD-10-CM | POA: Diagnosis not present

## 2019-12-05 DIAGNOSIS — M25512 Pain in left shoulder: Secondary | ICD-10-CM | POA: Diagnosis not present

## 2019-12-05 DIAGNOSIS — R2689 Other abnormalities of gait and mobility: Secondary | ICD-10-CM | POA: Diagnosis not present

## 2019-12-05 DIAGNOSIS — G894 Chronic pain syndrome: Secondary | ICD-10-CM | POA: Diagnosis not present

## 2019-12-05 DIAGNOSIS — M6281 Muscle weakness (generalized): Secondary | ICD-10-CM | POA: Diagnosis not present

## 2019-12-05 DIAGNOSIS — M25522 Pain in left elbow: Secondary | ICD-10-CM | POA: Diagnosis not present

## 2019-12-05 DIAGNOSIS — G8921 Chronic pain due to trauma: Secondary | ICD-10-CM | POA: Diagnosis not present

## 2019-12-10 DIAGNOSIS — M25522 Pain in left elbow: Secondary | ICD-10-CM | POA: Diagnosis not present

## 2019-12-10 DIAGNOSIS — R2689 Other abnormalities of gait and mobility: Secondary | ICD-10-CM | POA: Diagnosis not present

## 2019-12-10 DIAGNOSIS — M542 Cervicalgia: Secondary | ICD-10-CM | POA: Diagnosis not present

## 2019-12-10 DIAGNOSIS — M545 Low back pain: Secondary | ICD-10-CM | POA: Diagnosis not present

## 2019-12-10 DIAGNOSIS — M6281 Muscle weakness (generalized): Secondary | ICD-10-CM | POA: Diagnosis not present

## 2019-12-10 DIAGNOSIS — G8921 Chronic pain due to trauma: Secondary | ICD-10-CM | POA: Diagnosis not present

## 2019-12-10 DIAGNOSIS — G8929 Other chronic pain: Secondary | ICD-10-CM | POA: Diagnosis not present

## 2019-12-10 DIAGNOSIS — G894 Chronic pain syndrome: Secondary | ICD-10-CM | POA: Diagnosis not present

## 2019-12-10 DIAGNOSIS — M25512 Pain in left shoulder: Secondary | ICD-10-CM | POA: Diagnosis not present

## 2019-12-10 DIAGNOSIS — M25562 Pain in left knee: Secondary | ICD-10-CM | POA: Diagnosis not present

## 2019-12-12 DIAGNOSIS — M25512 Pain in left shoulder: Secondary | ICD-10-CM | POA: Diagnosis not present

## 2019-12-12 DIAGNOSIS — M545 Low back pain: Secondary | ICD-10-CM | POA: Diagnosis not present

## 2019-12-12 DIAGNOSIS — M25562 Pain in left knee: Secondary | ICD-10-CM | POA: Diagnosis not present

## 2019-12-12 DIAGNOSIS — M6281 Muscle weakness (generalized): Secondary | ICD-10-CM | POA: Diagnosis not present

## 2019-12-12 DIAGNOSIS — R2689 Other abnormalities of gait and mobility: Secondary | ICD-10-CM | POA: Diagnosis not present

## 2019-12-12 DIAGNOSIS — G8929 Other chronic pain: Secondary | ICD-10-CM | POA: Diagnosis not present

## 2019-12-12 DIAGNOSIS — G8921 Chronic pain due to trauma: Secondary | ICD-10-CM | POA: Diagnosis not present

## 2019-12-12 DIAGNOSIS — G894 Chronic pain syndrome: Secondary | ICD-10-CM | POA: Diagnosis not present

## 2019-12-12 DIAGNOSIS — M25522 Pain in left elbow: Secondary | ICD-10-CM | POA: Diagnosis not present

## 2019-12-12 DIAGNOSIS — M542 Cervicalgia: Secondary | ICD-10-CM | POA: Diagnosis not present

## 2019-12-15 ENCOUNTER — Ambulatory Visit: Payer: Medicare Other | Admitting: Internal Medicine

## 2019-12-15 DIAGNOSIS — M542 Cervicalgia: Secondary | ICD-10-CM | POA: Diagnosis not present

## 2019-12-15 DIAGNOSIS — R2689 Other abnormalities of gait and mobility: Secondary | ICD-10-CM | POA: Diagnosis not present

## 2019-12-15 DIAGNOSIS — G8929 Other chronic pain: Secondary | ICD-10-CM | POA: Diagnosis not present

## 2019-12-15 DIAGNOSIS — M6281 Muscle weakness (generalized): Secondary | ICD-10-CM | POA: Diagnosis not present

## 2019-12-15 DIAGNOSIS — M25522 Pain in left elbow: Secondary | ICD-10-CM | POA: Diagnosis not present

## 2019-12-15 DIAGNOSIS — M25512 Pain in left shoulder: Secondary | ICD-10-CM | POA: Diagnosis not present

## 2019-12-15 DIAGNOSIS — M545 Low back pain: Secondary | ICD-10-CM | POA: Diagnosis not present

## 2019-12-15 DIAGNOSIS — G894 Chronic pain syndrome: Secondary | ICD-10-CM | POA: Diagnosis not present

## 2019-12-15 DIAGNOSIS — G8921 Chronic pain due to trauma: Secondary | ICD-10-CM | POA: Diagnosis not present

## 2019-12-15 DIAGNOSIS — M25562 Pain in left knee: Secondary | ICD-10-CM | POA: Diagnosis not present

## 2019-12-17 DIAGNOSIS — G894 Chronic pain syndrome: Secondary | ICD-10-CM | POA: Diagnosis not present

## 2019-12-17 DIAGNOSIS — G8921 Chronic pain due to trauma: Secondary | ICD-10-CM | POA: Diagnosis not present

## 2019-12-17 DIAGNOSIS — M25562 Pain in left knee: Secondary | ICD-10-CM | POA: Diagnosis not present

## 2019-12-17 DIAGNOSIS — M6281 Muscle weakness (generalized): Secondary | ICD-10-CM | POA: Diagnosis not present

## 2019-12-17 DIAGNOSIS — M25522 Pain in left elbow: Secondary | ICD-10-CM | POA: Diagnosis not present

## 2019-12-17 DIAGNOSIS — R2689 Other abnormalities of gait and mobility: Secondary | ICD-10-CM | POA: Diagnosis not present

## 2019-12-17 DIAGNOSIS — M25512 Pain in left shoulder: Secondary | ICD-10-CM | POA: Diagnosis not present

## 2019-12-17 DIAGNOSIS — M542 Cervicalgia: Secondary | ICD-10-CM | POA: Diagnosis not present

## 2019-12-17 DIAGNOSIS — G8929 Other chronic pain: Secondary | ICD-10-CM | POA: Diagnosis not present

## 2019-12-17 DIAGNOSIS — M545 Low back pain: Secondary | ICD-10-CM | POA: Diagnosis not present

## 2019-12-21 ENCOUNTER — Ambulatory Visit: Payer: Medicare Other

## 2019-12-22 ENCOUNTER — Ambulatory Visit (INDEPENDENT_AMBULATORY_CARE_PROVIDER_SITE_OTHER): Payer: Medicare Other | Admitting: Gastroenterology

## 2019-12-22 ENCOUNTER — Encounter: Payer: Self-pay | Admitting: Gastroenterology

## 2019-12-22 ENCOUNTER — Other Ambulatory Visit: Payer: Self-pay

## 2019-12-22 VITALS — BP 140/78 | HR 96 | Temp 97.0°F | Ht 64.0 in | Wt 188.8 lb

## 2019-12-22 DIAGNOSIS — K642 Third degree hemorrhoids: Secondary | ICD-10-CM | POA: Diagnosis not present

## 2019-12-22 NOTE — Patient Instructions (Signed)
HEMORRHOID BANDING PROCEDURE    FOLLOW-UP CARE   1. The procedure you have had should have been relatively painless since the banding of the area involved does not have nerve endings and there is no pain sensation.  The rubber band cuts off the blood supply to the hemorrhoid and the band may fall off as soon as 48 hours after the banding (the band may occasionally be seen in the toilet bowl following a bowel movement). You may notice a temporary feeling of fullness in the rectum which should respond adequately to plain Tylenol or Motrin.  2. Following the banding, avoid strenuous exercise that evening and resume full activity the next day.  A sitz bath (soaking in a warm tub) or bidet is soothing, and can be useful for cleansing the area after bowel movements.     3. To avoid constipation, take two tablespoons of natural wheat bran, natural oat bran, flax, Benefiber or any over the counter fiber supplement and increase your water intake to 7-8 glasses daily.    4. Unless you have been prescribed anorectal medication, do not put anything inside your rectum for two weeks: No suppositories, enemas, fingers, etc.  5. Occasionally, you may have more bleeding than usual after the banding procedure.  This is often from the untreated hemorrhoids rather than the treated one.  Don't be concerned if there is a tablespoon or so of blood.  If there is more blood than this, lie flat with your bottom higher than your head and apply an ice pack to the area. If the bleeding does not stop within a half an hour or if you feel faint, call our office at [phone] or go to the emergency room.  6. Problems are not common; however, if there is a substantial amount of bleeding, severe pain, chills, fever or difficulty passing urine (very rare) or other problems, you should call us at [phone] or report to the nearest emergency room.  7. Do not stay seated continuously for more than 2-3 hours for a day or two  after the procedure.  Tighten your buttock muscles 10-15 times every two hours and take 10-15 deep breaths every 1-2 hours.  Do not spend more than a few minutes on the toilet if you cannot empty your bowel; instead re-visit the toilet at a later time.    HOLD XARELTO 1 DAY AND PLAVIX 3 DAYS PRIOR TO NEXT BANDING  GO BACK ON BOTH BLOOD THINNERS TOMORROW

## 2019-12-22 NOTE — Progress Notes (Signed)
PROCEDURE NOTE: The patient presents with symptomatic grade II- III  hemorrhoids, requesting rubber band ligation of his/her hemorrhoidal disease.  All risks, benefits and alternative forms of therapy were described and informed consent was obtained.  In the Left Lateral Decubitus position anoscopic examination revealed grade II-III hemorrhoids in the right posterior, right anterior and left lateral position(s).  The anorectum was pre-medicated with 0.125% NTG and Recticare The decision was made to band the R posterior internal hemorrhoid, and the [place] was used to perform band ligation without complication.  Digital anorectal examination was then performed to assure proper positioning of the band, and to adjust the banded tissue as required.  The patient was discharged home without pain or other issues.  Dietary and behavioral recommendations were given and along with follow-up instructions.     The following adjunctive treatments were recommended: Benefiber 1 tablespoon TID  Restart Xarelto and Plavix tomorrow Hold Xarelto for 1 day and Plavix for 3 days prior to next hemorrhoidal banding  The patient will return in 2-4 weeks for  follow-up and possible additional banding as required. No complications were encountered and the patient tolerated the procedure well.  Damaris Hippo , MD [phone]

## 2019-12-24 ENCOUNTER — Emergency Department (HOSPITAL_BASED_OUTPATIENT_CLINIC_OR_DEPARTMENT_OTHER)
Admission: EM | Admit: 2019-12-24 | Discharge: 2019-12-24 | Disposition: A | Discharge status: Home / Self Care | Payer: Medicare Other | Attending: Emergency Medicine | Admitting: Emergency Medicine

## 2019-12-24 ENCOUNTER — Other Ambulatory Visit: Payer: Self-pay

## 2019-12-24 ENCOUNTER — Encounter (HOSPITAL_BASED_OUTPATIENT_CLINIC_OR_DEPARTMENT_OTHER): Payer: Self-pay

## 2019-12-24 ENCOUNTER — Encounter: Payer: Self-pay | Admitting: Internal Medicine

## 2019-12-24 ENCOUNTER — Ambulatory Visit (INDEPENDENT_AMBULATORY_CARE_PROVIDER_SITE_OTHER): Payer: Medicare Other | Admitting: Internal Medicine

## 2019-12-24 ENCOUNTER — Emergency Department (HOSPITAL_BASED_OUTPATIENT_CLINIC_OR_DEPARTMENT_OTHER): Payer: Medicare Other

## 2019-12-24 ENCOUNTER — Telehealth: Payer: Self-pay | Admitting: Gastroenterology

## 2019-12-24 VITALS — BP 167/100 | HR 120 | Temp 96.6°F | Resp 18 | Ht 64.0 in | Wt 191.2 lb

## 2019-12-24 DIAGNOSIS — I130 Hypertensive heart and chronic kidney disease with heart failure and stage 1 through stage 4 chronic kidney disease, or unspecified chronic kidney disease: Secondary | ICD-10-CM | POA: Insufficient documentation

## 2019-12-24 DIAGNOSIS — Z7984 Long term (current) use of oral hypoglycemic drugs: Secondary | ICD-10-CM | POA: Insufficient documentation

## 2019-12-24 DIAGNOSIS — Z86711 Personal history of pulmonary embolism: Secondary | ICD-10-CM | POA: Insufficient documentation

## 2019-12-24 DIAGNOSIS — Z9861 Coronary angioplasty status: Secondary | ICD-10-CM | POA: Insufficient documentation

## 2019-12-24 DIAGNOSIS — Z79899 Other long term (current) drug therapy: Secondary | ICD-10-CM | POA: Diagnosis not present

## 2019-12-24 DIAGNOSIS — E1122 Type 2 diabetes mellitus with diabetic chronic kidney disease: Secondary | ICD-10-CM | POA: Diagnosis not present

## 2019-12-24 DIAGNOSIS — R1030 Lower abdominal pain, unspecified: Secondary | ICD-10-CM | POA: Diagnosis not present

## 2019-12-24 DIAGNOSIS — K5730 Diverticulosis of large intestine without perforation or abscess without bleeding: Secondary | ICD-10-CM | POA: Diagnosis not present

## 2019-12-24 DIAGNOSIS — Z86718 Personal history of other venous thrombosis and embolism: Secondary | ICD-10-CM | POA: Diagnosis not present

## 2019-12-24 DIAGNOSIS — R109 Unspecified abdominal pain: Secondary | ICD-10-CM | POA: Diagnosis not present

## 2019-12-24 DIAGNOSIS — E0590 Thyrotoxicosis, unspecified without thyrotoxic crisis or storm: Secondary | ICD-10-CM | POA: Insufficient documentation

## 2019-12-24 DIAGNOSIS — I5032 Chronic diastolic (congestive) heart failure: Secondary | ICD-10-CM | POA: Diagnosis not present

## 2019-12-24 DIAGNOSIS — N183 Chronic kidney disease, stage 3 unspecified: Secondary | ICD-10-CM | POA: Insufficient documentation

## 2019-12-24 DIAGNOSIS — Z951 Presence of aortocoronary bypass graft: Secondary | ICD-10-CM | POA: Insufficient documentation

## 2019-12-24 DIAGNOSIS — I2510 Atherosclerotic heart disease of native coronary artery without angina pectoris: Secondary | ICD-10-CM | POA: Diagnosis not present

## 2019-12-24 DIAGNOSIS — I10 Essential (primary) hypertension: Secondary | ICD-10-CM

## 2019-12-24 DIAGNOSIS — R1031 Right lower quadrant pain: Secondary | ICD-10-CM | POA: Diagnosis not present

## 2019-12-24 DIAGNOSIS — E785 Hyperlipidemia, unspecified: Secondary | ICD-10-CM | POA: Insufficient documentation

## 2019-12-24 DIAGNOSIS — R000 Tachycardia, unspecified: Secondary | ICD-10-CM | POA: Diagnosis not present

## 2019-12-24 LAB — COMPREHENSIVE METABOLIC PANEL
ALT: 10 U/L (ref 0–44)
AST: 17 U/L (ref 15–41)
Albumin: 4.2 g/dL (ref 3.5–5.0)
Alkaline Phosphatase: 43 U/L (ref 38–126)
Anion gap: 10 (ref 5–15)
BUN: 18 mg/dL (ref 8–23)
CO2: 20 mmol/L — ABNORMAL LOW (ref 22–32)
Calcium: 8.8 mg/dL — ABNORMAL LOW (ref 8.9–10.3)
Chloride: 106 mmol/L (ref 98–111)
Creatinine, Ser: 1.24 mg/dL — ABNORMAL HIGH (ref 0.44–1.00)
GFR calc Af Amer: 47 mL/min — ABNORMAL LOW (ref >60)
GFR calc non Af Amer: 40 mL/min — ABNORMAL LOW (ref >60)
Glucose, Bld: 139 mg/dL — ABNORMAL HIGH (ref 70–99)
Potassium: 4.7 mmol/L (ref 3.5–5.1)
Sodium: 136 mmol/L (ref 135–145)
Total Bilirubin: 0.4 mg/dL (ref 0.3–1.2)
Total Protein: 6.8 g/dL (ref 6.5–8.1)

## 2019-12-24 LAB — URINALYSIS, ROUTINE W REFLEX MICROSCOPIC
Bilirubin Urine: NEGATIVE
Glucose, UA: NEGATIVE mg/dL
Hgb urine dipstick: NEGATIVE
Ketones, ur: NEGATIVE mg/dL
Nitrite: NEGATIVE
Protein, ur: NEGATIVE mg/dL
Specific Gravity, Urine: 1.01 (ref 1.005–1.030)
pH: 6 (ref 5.0–8.0)

## 2019-12-24 LAB — CBC WITH DIFFERENTIAL/PLATELET
Abs Immature Granulocytes: 0.01 10*3/uL (ref 0.00–0.07)
Basophils Absolute: 0 10*3/uL (ref 0.0–0.1)
Basophils Relative: 0 %
Eosinophils Absolute: 0.1 10*3/uL (ref 0.0–0.5)
Eosinophils Relative: 2 %
HCT: 40 % (ref 36.0–46.0)
Hemoglobin: 12.4 g/dL (ref 12.0–15.0)
Immature Granulocytes: 0 %
Lymphocytes Relative: 24 %
Lymphs Abs: 1.2 10*3/uL (ref 0.7–4.0)
MCH: 29.4 pg (ref 26.0–34.0)
MCHC: 31 g/dL (ref 30.0–36.0)
MCV: 94.8 fL (ref 80.0–100.0)
Monocytes Absolute: 0.4 10*3/uL (ref 0.1–1.0)
Monocytes Relative: 8 %
Neutro Abs: 3.2 10*3/uL (ref 1.7–7.7)
Neutrophils Relative %: 66 %
Platelets: 160 10*3/uL (ref 150–400)
RBC: 4.22 MIL/uL (ref 3.87–5.11)
RDW: 13.3 % (ref 11.5–15.5)
WBC: 5 10*3/uL (ref 4.0–10.5)
nRBC: 0 % (ref 0.0–0.2)

## 2019-12-24 LAB — URINALYSIS, MICROSCOPIC (REFLEX)

## 2019-12-24 LAB — OCCULT BLOOD X 1 CARD TO LAB, STOOL: Fecal Occult Bld: POSITIVE — AB

## 2019-12-24 LAB — LIPASE, BLOOD: Lipase: 19 U/L (ref 11–51)

## 2019-12-24 MED ORDER — FENTANYL CITRATE (PF) 100 MCG/2ML IJ SOLN
100.0000 ug | Freq: Once | INTRAMUSCULAR | Status: AC
  Administered 2019-12-24: 100 ug via INTRAVENOUS
  Filled 2019-12-24: qty 2

## 2019-12-24 MED ORDER — ONDANSETRON HCL 4 MG/2ML IJ SOLN
4.0000 mg | Freq: Once | INTRAMUSCULAR | Status: AC
  Administered 2019-12-24: 4 mg via INTRAVENOUS
  Filled 2019-12-24: qty 2

## 2019-12-24 MED ORDER — FENTANYL CITRATE (PF) 100 MCG/2ML IJ SOLN
50.0000 ug | Freq: Once | INTRAMUSCULAR | Status: DC
  Filled 2019-12-24: qty 2

## 2019-12-24 MED ORDER — FENTANYL CITRATE (PF) 100 MCG/2ML IJ SOLN
50.0000 ug | Freq: Once | INTRAMUSCULAR | Status: AC
  Administered 2019-12-24: 50 ug via INTRAMUSCULAR

## 2019-12-24 MED ORDER — FAMOTIDINE 20 MG PO TABS: 20.0000 mg | ORAL_TABLET | Freq: Every day | ORAL | 0 refills | Status: DC

## 2019-12-24 MED ORDER — NITROGLYCERIN 0.4 MG SL SUBL: 0.4000 mg | SUBLINGUAL_TABLET | SUBLINGUAL | 3 refills | Status: AC | PRN

## 2019-12-24 MED ORDER — IOHEXOL 300 MG/ML  SOLN
100.0000 mL | Freq: Once | INTRAMUSCULAR | Status: AC | PRN
  Administered 2019-12-24: 100 mL via INTRAVENOUS

## 2019-12-24 MED ORDER — SODIUM CHLORIDE 0.9 % IV BOLUS
500.0000 mL | Freq: Once | INTRAVENOUS | Status: AC
  Administered 2019-12-24: 500 mL via INTRAVENOUS

## 2019-12-24 MED ORDER — FENTANYL CITRATE (PF) 100 MCG/2ML IJ SOLN
50.0000 ug | Freq: Once | INTRAMUSCULAR | Status: AC
  Administered 2019-12-24: 50 ug via INTRAVENOUS
  Filled 2019-12-24: qty 2

## 2019-12-24 NOTE — Progress Notes (Signed)
Pre visit review using our clinic review tool, if applicable. No additional management support is needed unless otherwise documented below in the visit note. 

## 2019-12-24 NOTE — ED Provider Notes (Signed)
[facility]GENCY DEPARTMENT Provider Note   CSN: [id number] Arrival date & time: 12/24/19  1411     History Chief Complaint  Patient presents with  . Abdominal Pain    [name] is a 85 y.o. female with PMH/o diverticulosis, hemorrhoids, CAD, malignant polyp of colon, HLD who presents for evaluation of abdominal pain that began this morning. Patient was sent down from her PCP's office after being evaluated for abdominal pain that began today. She states that she started having a sharp, constant pain in her lower abdomen this morning. It initially started in the RLQ, where she has had pain before, but it began spreading to her lower abdomen. She took some hydrocodone with no improvement. She has had some associated nausea and decreased appetite but no vomiting. She recently had banding procedure of her hemorrhoid. She stated that she has been having diarrhea but she took Miralax to prep for her hemorrhoid banding. Her last bowel movement was yesterday. She did note some small presence of blood in the stool but attributed it to her hemorrhoids. She has not had any associated pain to the hemorrhoids. She states that for the last few months she has had some occasional dark urine but states that it comes and goes. Denies any hematuria at this time. No dysuria. She denies any fevers, CP, SOB, cough, vomiting.   The history is provided by the patient.       Past Medical History:  Diagnosis Date  . Abnormal echocardiogram    09/2013: mild LVH, mild focal basal hypertrophy of septum, EF 60-65%, normal WM, grade 1 diastolic dysfunction, MAC, mild LAE, ASA, PASP 34. Possible oscillating MV density - reviewed by MD and felt there was no significant abnormality other than MAC noted with mitral valve and did not require TEE.  . Adenomatous colon polyp   . Allergic rhinitis   . Arthritis    "back; ankles; hands; knees" (10/31/2013)  . Bilateral sensorineural hearing loss   . CAD  (coronary artery disease)    a. Nonobst in 2011. b. Abnormal nuc 09/2013 -> s/p cutting balloon to D2, mild LAD disease; c. 08/2015 MV: no ischemia, EF 71%.  . Cancer ([place])    cancerous polyps  . Carcinoma in situ in a polyp 1994   a. 1994 - malignant polyp removed during colonoscopy.  . Charcot's Foot    a.  01/2016 s/p RLE transtibial amputation 2/2 Charcot rocker-bottom deformity and insensate neuropathy ulceration.  . Chronic diastolic CHF (congestive heart failure) ([place])    a. 09/2013 Echo: EF 60-65%, mild LVH, Gr1 DD.  Marland Kitchen Diverticulosis   . DJD (degenerative joint disease)   . DVT (deep venous thrombosis) ([place]) 12/2007  . Dyspnea    a. Chronic, extensive w/u see OV note 09-2010. b. [place] 2011: ormal with RA 5 RV 31/2 PA 27/12 (19) PCW 10 CO normal. No evidence of shunting with sitting up in cath lab. c. CPX 2011: see report. d. Prior fluoro of diaphragm -  R diaphragm elevated at rest but both moved with inspiration.   . Fatty liver   . GERD (gastroesophageal reflux disease)    a. Hx GERD/esophageal dysmotility followed by Dr. Olevia Perches.   Marland Kitchen Head injury due to trauma   . Headache    Optic migraine  . Hemorrhoids   . Hiatal hernia    a. s/p Nissen fundoplication 5638.  Marland Kitchen Hyperlipidemia    a. patient unwilling to use statins.  . Hypertensive heart disease   .  IBS (irritable bowel syndrome)   . Macular degeneration 03/2009   Dr. Rosana Hoes  . Myocardial infarction ([place]) 2017  . Neuropathy    a. Hands, feet, legs.  . Orthostatic hypotension   . Osteomyelitis ([place])    a. 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 01/2016 s/p RLE transtibial amputation 2/2 Charcot rocker-bottom deformity and insensate neuropathy ulceration.  . Osteoporosis   . PE (pulmonary embolism) 12/2007   a. PE/DVT after neck surgery 2009. b. coumadin d/c 10-2008.  Marland Kitchen Pneumonia 2015ish  . PONV (postoperative nausea and vomiting) 2009   neck surgery  . PPD positive     . Pulmonary nodule    incidental per CT:  Pet scan 4-9: likely benign, CT 08-2009 no change, no further CTs (Dr. Gwenette Greet)  . Recurrent UTI   . RSD (reflex sympathetic dystrophy)    a. Chronic pain.  Marland Kitchen Spinal stenosis   . Type II diabetes mellitus (HCC)    no on medication  . Venous insufficiency    a. Contributing to LEE.    Patient Active Problem List   Diagnosis Date Noted  . External hemorrhoids 02/15/2018  . History of three vessel coronary artery bypass - 2017 , in [facility] 09/25/2017  . PVD (peripheral vascular disease) ([place])-- stent L leg 06-2017 Dr Feliberto [facility] 07/23/2017  . Phantom pain ([place]) 04/01/2017  . Constipation, chronic 04/01/2017  . S/P unilateral BKA (below knee amputation), right ([place]) 12/14/2016  . Toe osteomyelitis, left ([place]) 11/17/2016  . Acute on chronic diastolic CHF (congestive heart failure), NYHA class 3 ([place]) 03/30/2016  . Chest pain 03/30/2016  . Coronary artery disease due to lipid rich plaque 03/30/2016  . Statin intolerance 03/30/2016  . Acute renal failure superimposed on stage 3 chronic kidney disease ([place]) 03/30/2016  . S/P below knee amputation ([place]) 02/18/2016  . Head trauma 11/26/2015  . Memory loss 11/26/2015  . PCP NOTES >>>>>>>>>>>>>>>>>>>> 10/18/2015  . S/P partial colectomy 01/08/2015  . 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 Anemia 01/18/2014  . Rectal bleeding 10/31/2013  . CAD (coronary artery disease)   . Carcinoma in situ in a polyp   . Depression 09/17/2013  . Charcot's joint 08/21/2013  . Foot osteomyelitis, right ([place]) 07/16/2013  . Chronic pain associated with significant psychosocial dysfunction 05/06/2013  . Neuropathy   . Hyperthyroidism   . Lower extremity edema 03/08/2011  . Annual physical exam 03/08/2011  . ALLERGIC RHINITIS 04/15/2010  . ORTHOSTATIC DIZZINESS 04/13/2010  . HIATAL HERNIA 12/06/2009  . GASTRIC ULCER, HX OF 12/06/2009  . DYSPNEA 03/18/2009  . DEGENERATIVE JOINT DISEASE  06/03/2008  . COLONIC POLYPS 01/27/2008  . DIVERTICULOSIS, COLON 01/27/2008  . IRRITABLE BOWEL SYNDROME, HX OF 01/27/2008  . Pulmonary nodule, right 01/22/2008  . Hyperlipidemia 12/30/2007  . Osteoporosis 12/30/2007  . TB SKIN TEST, POSITIVE 08/09/2007  . DM type 2 with diabetic peripheral neuropathy ([place]) 05/09/2007  . Reflex sympathetic dystrophy-- PAIN MNGMT  05/09/2007  . Essential hypertension 04/15/2007  . GERD 04/15/2007    Past Surgical History:  Procedure Laterality Date  . AMPUTATION  03/06/2012   Procedure: AMPUTATION FOOT;  Surgeon: Newt Minion, MD;  Location: [facility];  Service: Orthopedics;  Laterality: Left;  FIFTH RAY AMPUTATION   . AMPUTATION Right 02/18/2016   Procedure: AMPUTATION BELOW KNEE;  Surgeon: Newt Minion, MD;  Location: [facility];  Service: Orthopedics;  Laterality: Right;  . AMPUTATION Left 11/22/2016  Procedure: Amputation 4th Toe Left Foot at Metatarsophalangeal Joint;  Surgeon: Newt Minion, MD;  Location: [facility];  Service: Orthopedics;  Laterality: Left;  . ANKLE FUSION  09/27/2012   Procedure: ANKLE FUSION;  Surgeon: Newt Minion, MD;  Location: [facility];  Service: Orthopedics;  Laterality: Left;  Left Tibiocalcaneal Fusion  . ANKLE FUSION Right 05/09/2013   Procedure: ANKLE FUSION;  Surgeon: Newt Minion, MD;  Location: [facility];  Service: Orthopedics;  Laterality: Right;  Excision Osteomyelitis Base 1st MT Right Foot, Fusion Medial Column  . ANTERIOR CERVICAL DECOMP/DISCECTOMY FUSION  12/18/07   For OA,  Dr. Lorin Mercy:  fu by a PE  . CARDIAC CATHETERIZATION  05/2010    at [facility]  . CARPAL TUNNEL RELEASE Left 09/2011  . CATARACT EXTRACTION W/ INTRAOCULAR LENS  IMPLANT, BILATERAL  2004   feb 2004 left, aug 2004 right  . CHOLECYSTECTOMY  03/2002  . CORONARY ANGIOPLASTY  10/31/2013  . CORONARY ARTERY BYPASS GRAFT  09/27/2016  . CYST REMOVAL HAND  06/2003  . FOOT BONE EXCISION Right 06/2009  . LAPAROSCOPIC RIGHT HEMI COLECTOMY N/A 01/08/2015   Procedure:  LAPAROSCOPIC ASSISTED RIGHT HEMI COLECTOMY;  Surgeon: Johnathan Hausen, MD;  Location: [facility];  Service: General;  Laterality: N/A;  . LEFT HEART CATHETERIZATION WITH CORONARY ANGIOGRAM N/A 10/31/2013   Procedure: LEFT HEART CATHETERIZATION WITH CORONARY ANGIOGRAM;  Surgeon: Jettie Booze, MD;  Location: [facility] CATH LAB;  Service: Cardiovascular;  Laterality: N/A;  . NASAL SEPTUM SURGERY  09/1963  . NISSEN FUNDOPLICATION  10/28/1094  . PERCUTANEOUS CORONARY INTERVENTION-BALLOON ONLY  10/31/2013   Procedure: PERCUTANEOUS CORONARY INTERVENTION-BALLOON ONLY;  Surgeon: Jettie Booze, MD;  Location: [facility] C[facility] LAB;  Service: Cardiovascular;;  . ROTATOR CUFF REPAIR Right 07/2007   Dr. Percell Miller  . ROTATOR CUFF REPAIR Left 11/2004  . stent in left leg, behind knee  06/27/2017   x2, done at [facility]ology in [facility]  . TOE AMPUTATION Right 08/2008   3rd toe, Dr. Sharol Given due to osteomyelitis  . VAGINAL HYSTERECTOMY  03/1975  . VEIN LIGATION Bilateral 03/1966  . VENA CAVA FILTER PLACEMENT  01/2010   green filter; "due to blood clots"  . WISDOM TOOTH EXTRACTION  06/2007     OB History   No obstetric history on file.     Family History  Problem Relation Age of Onset  . Heart disease Mother        mitral valve replaced  . Arthritis Mother   . Diabetic kidney disease Daughter   . Diabetes Father   . Stroke Father   . Migraines Sister   . Hypertension Other   . Breast cancer Other   . Colon cancer Neg Hx   . Esophageal cancer Neg Hx   . Stomach cancer Neg Hx   . Rectal cancer Neg Hx     Social History   Tobacco Use  . Smoking status: Never Smoker  . Smokeless tobacco: Never Used  . Tobacco comment: tried for a few months in college.   Substance Use Topics  . Alcohol use: No    Alcohol/week: 0.0 standard drinks  . Drug use: No    Home Medications Prior to Admission medications   Medication Sig Start Date End Date Taking? Authorizing Provider  Alpha-Lipoic Acid 600 MG CAPS Take  600 mg by mouth 2 (two) times daily.    [provider]  amitriptyline (ELAVIL) 25 MG tablet Take 1 tablet (25 mg total) by  mouth at bedtime. 04/08/19   Colon Branch, MD  atorvastatin (LIPITOR) 40 MG tablet Take 40 mg by mouth daily. 12/01/18   [provider]  carvedilol (COREG) 25 MG tablet Take 25 mg by mouth 2 (two) times daily with a meal.    [provider]  Cholecalciferol (VITAMIN D3) 5000 UNITS CAPS Take 5,000 Units by mouth every evening. Reported on 11/24/2015    [provider]  ciclopirox (LOPROX) 0.77 % cream Apply 1 application topically 2 (two) times daily as needed.    [provider]  clopidogrel (PLAVIX) 75 MG tablet Take 75 mg by mouth at bedtime.     [provider]  Cyanocobalamin (VITAMIN B-12) 500 MCG SUBL Place 500 mcg under the tongue daily.    [provider]  diclofenac sodium (VOLTAREN) 1 % GEL APPLY 2-4 GRAMS TO AFFECTED AREA 3 TIMES DAILY AS NEEDED 08/21/18   Colon Branch, MD  famotidine (PEPCID) 20 MG tablet Take 1 tablet (20 mg total) by mouth daily. 12/24/19   Volanda Napoleon, PA-C  ferrous sulfate (FEROSUL) 325 (65 FE) MG tablet Take 1 tablet (325 mg total) by mouth 2 (two) times daily with a meal. 04/24/19   Colon Branch, MD  furosemide (LASIX) 20 MG tablet Take 20 mg by mouth daily. 11/09/16   [provider]  glucose blood (FREESTYLE LITE) test strip USE TO CHECK BLOOD SUGAR NO MORE THAN TWICE DAILY 12/04/19   Colon Branch, MD  HYDROcodone-acetaminophen (NORCO/VICODIN) 5-325 MG tablet Take 1 tablet by mouth every 6 (six) hours as needed for moderate pain. 11/28/19   Colon Branch, MD  hydrocortisone (ANUSOL-HC) 2.5 % rectal cream Place 1 application rectally 2 (two) times daily. 11/26/19   Mauri Pole, MD  hydrocortisone (ANUSOL-HC) 25 MG suppository Place 1 suppository (25 mg total) rectally every 12 (twelve) hours. Patient not taking: Reported on 12/24/2019 08/20/18   Ladene Artist, MD  Lancets  (FREESTYLE) lancets CHECK BLOOD SUGAR TWICE DAILY 04/08/19   Colon Branch, MD  lidocaine (LIDODERM) 5 % Place 1 patch onto the skin daily. Remove & Discard patch within 12 hours or as directed by MD 07/23/17   Colon Branch, MD  lubiprostone ([name]) 24 MCG capsule Take 1 capsule (24 mcg total) by mouth 2 (two) times daily with a meal. 11/26/19   Nandigam, Venia Minks, MD  meclizine (ANTIVERT) 25 MG tablet Take 1 tablet (25 mg total) by mouth 2 (two) times daily as needed for dizziness. 10/17/18   Colon Branch, MD  methenamine (HIPREX) 1 g tablet Take 1 tablet by mouth 2 (two) times daily. 07/30/18   [provider]  miconazole (MICOTIN) 2 % cream Apply 1 application topically 2 (two) times daily. For 4 weeks 11/26/19   Mauri Pole, MD  Multiple Vitamins-Minerals (PRESERVISION AREDS 2) CAPS Take 1 capsule by mouth 2 (two) times daily.    [provider]  nitroGLYCERIN (NITROSTAT) 0.4 MG SL tablet Place 1 tablet (0.4 mg total) under the tongue every 5 (five) minutes x 3 doses as needed for chest pain. 12/24/19   Colon Branch, MD  omeprazole (PRILOSEC) 20 MG capsule TAKE 1 CAPSULE(20 MG) BY MOUTH TWICE DAILY 09/09/19   Nandigam, Venia Minks, MD  ondansetron (ZOFRAN-ODT) 4 MG disintegrating tablet TAKE 1 TABLET BY MOUTH EVERY 8 HOURS AS NEEDED FOR NAUSEA OR VOMITING 11/11/19   Lomax, Amy, NP  OVER THE COUNTER MEDICATION Take 1 capsule by  mouth 2 (two) times daily. Integrative Digestive Formula    [provider]  OVER THE COUNTER MEDICATION 1 tablet 3 (three) times daily. [place]ine Gluco Defense     [provider]  Polyvinyl Alcohol-Povidone (REFRESH OP) Place 1 drop into both eyes 3 (three) times daily as needed (dry eyes).     [provider]  pramipexole (MIRAPEX) 0.125 MG tablet Take 1-2 tablets (0.125-0.25 mg total) by mouth at bedtime as needed. 04/24/19   Colon Branch, MD  rivaroxaban (XARELTO) 2.5 MG TABS tablet Take 2.5 mg by mouth 2 (two) times daily.     [provider]    Allergies    Cymbalta [duloxetine hcl], Gabapentin, Silicone, Metformin and related, Adhesive [tape], Cefazolin, Ciprofloxacin, Clorazepate dipotassium, Dilaudid [hydromorphone hcl], Doxycycline, Enablex [darifenacin hydrobromide er], Levofloxacin, Lyrica [pregabalin], Methadone hcl, Morphine and related, and Talwin [pentazocine]  Review of Systems   Review of Systems  Constitutional: Negative for fever.  Respiratory: Negative for cough and shortness of breath.   Cardiovascular: Negative for chest pain.  Gastrointestinal: Positive for abdominal pain, blood in stool, diarrhea and nausea. Negative for vomiting.  Genitourinary: Negative for dysuria and hematuria.  Neurological: Negative for weakness, numbness and headaches.  All other systems reviewed and are negative.   Physical Exam Updated Vital Signs BP 135/80 (BP Location: Right Arm)   Pulse 87   Temp 98.3 F (36.8 C) (Oral)   Resp 17   Ht _0  (1.626 m)   Wt 81.6 kg   SpO2 100%   BMI 30.90 kg/m   Physical Exam Vitals and nursing note reviewed. Exam conducted with a chaperone present.  Constitutional:      Appearance: Normal appearance. She is well-developed.  HENT:     Head: Normocephalic and atraumatic.  Eyes:     General: Lids are normal.     Conjunctiva/sclera: Conjunctivae normal.     Pupils: Pupils are equal, round, and reactive to light.  Cardiovascular:     Rate and Rhythm: Normal rate and regular rhythm.     Pulses: Normal pulses.     Heart sounds: Normal heart sounds. No murmur. No friction rub. No gallop.   Pulmonary:     Effort: Pulmonary effort is normal.     Breath sounds: Normal breath sounds.     Comments: Lungs clear to auscultation bilaterally.  Symmetric chest rise.  No wheezing, rales, rhonchi. Abdominal:     Palpations: Abdomen is soft. Abdomen is not rigid.     Tenderness: There is abdominal tenderness in the right lower quadrant, suprapubic area and left lower  quadrant. There is no right CVA tenderness, left CVA tenderness or guarding.     Comments: Abdomen is soft, non-distended. Tenderness to palpation noted diffusely to the lower abdomen. No guarding. No rigidity. No peritoneal signs.   Genitourinary:    Comments: The exam was performed with a chaperone present. Digital Rectal Exam reveals sphincter with good tone. Non-thrombosed hemorrhoids noted to the 7 o'clock position. No masses or fissures. Stool color is brown with no overt blood. Musculoskeletal:        General: Normal range of motion.     Cervical back: Full passive range of motion without pain.  Skin:    General: Skin is warm and dry.     Capillary Refill: Capillary refill takes less than 2 seconds.  Neurological:     Mental Status: She is alert and oriented to person, place, and time.  Psychiatric:  Speech: Speech normal.     ED Results / Procedures / Treatments   Labs (all labs ordered are listed, but only abnormal results are displayed) Labs Reviewed  COMPREHENSIVE METABOLIC PANEL - Abnormal; Notable for the following components:      Result Value   CO2 20 (*)    Glucose, Bld 139 (*)    Creatinine, Ser 1.24 (*)    Calcium 8.8 (*)    GFR calc non Af Amer 40 (*)    GFR calc Af Amer 47 (*)    All other components within normal limits  URINALYSIS, ROUTINE W REFLEX MICROSCOPIC - Abnormal; Notable for the following components:   Leukocytes,Ua TRACE (*)    All other components within normal limits  OCCULT BLOOD X 1 CARD TO LAB, STOOL - Abnormal; Notable for the following components:   Fecal Occult Bld POSITIVE (*)    All other components within normal limits  URINALYSIS, MICROSCOPIC (REFLEX) - Abnormal; Notable for the following components:   Bacteria, UA RARE (*)    All other components within normal limits  URINE CULTURE  CBC WITH DIFFERENTIAL/PLATELET  LIPASE, BLOOD    EKG EKG Interpretation  Date/Time:  Wednesday December 24 2019 14:40:54 EST Ventricular  Rate:  119 PR Interval:    QRS Duration: 98 QT Interval:  344 QTC Calculation: 484 R Axis:   -4 Text Interpretation: Sinus tachycardia Low voltage, precordial leads Borderline T abnormalities, inferior leads Baseline wander in lead(s) I III aVL V1 V3 V5 V6 Confirmed by Blanchie Dessert [phone]) on 12/24/2019 2:49:31 PM   Radiology CT ABDOMEN PELVIS W CONTRAST  Result Date: 12/24/2019 CLINICAL DATA:  Abdominal pain EXAM: CT ABDOMEN AND PELVIS WITH CONTRAST TECHNIQUE: Multidetector CT imaging of the abdomen and pelvis was performed using the standard protocol following bolus administration of intravenous contrast. CONTRAST:  171m OMNIPAQUE IOHEXOL 300 MG/ML  SOLN COMPARISON:  05/21/2015 FINDINGS: Lower chest: Visualized lung bases are clear. Small hiatal hernia. Hepatobiliary: Stable low-density lesion along the anterior aspect of the left hepatic lobe, likely reflecting a small cyst. Liver is otherwise unremarkable. Prior cholecystectomy. No intrahepatic biliary dilatation. Pancreas: Fatty infiltration of the pancreas. No ductal dilatation or peripancreatic inflammatory changes. Spleen: Normal in size without focal abnormality. Adrenals/Urinary Tract: Unremarkable adrenal glands. Kidneys enhance symmetrically. No focal renal lesion, stone, or hydronephrosis. Bilateral ureters are unremarkable. A single air bubble is present within the non dependent portion of the urinary bladder. Bladder appears otherwise unremarkable. Stomach/Bowel: Postsurgical changes at the gastroesophageal junction from Nissen fundoplication with small recurrent hiatal hernia. Stomach unremarkable. No dilated loops of small bowel. Scattered colonic diverticulosis. No focal bowel wall thickening. No pericolonic inflammatory changes. Appendix appears surgically absent. Vascular/Lymphatic: Atherosclerotic calcification of the aortoiliac axis. Aorta is nonaneurysmal. IVC filter noted. No abdominopelvic lymphadenopathy. Reproductive: Status  post hysterectomy. No adnexal masses. Other: Tiny fat containing periumbilical hernia. No ascites or pneumoperitoneum. Musculoskeletal: Chronic mild superior endplate compression deformity of L1 is unchanged. No new or acute osseous findings. IMPRESSION: 1. No acute abdominopelvic findings. 2. Colonic diverticulosis without evidence of acute diverticulitis. 3. Single bubble of air within the urinary bladder. Correlate for history of recent instrumentation. 4. Postsurgical changes at the gastroesophageal junction from Nissen fundoplication with small recurrent hiatal hernia. Aortic Atherosclerosis (ICD10-I70.0). Electronically Signed   By: NDavina PokeD.O.   On: 12/24/2019 16:11    Procedures Procedures (including critical care time)  Medications Ordered in ED Medications  fentaNYL (SUBLIMAZE) injection 100 mcg (100 mcg Intravenous Given 12/24/19 1504)  ondansetron (ZOFRAN) injection 4 mg (4 mg Intravenous Given 12/24/19 1504)  sodium chloride 0.9 % bolus 500 mL ( Intravenous Stopped 12/24/19 1637)  iohexol (OMNIPAQUE) 300 MG/ML solution 100 mL (100 mLs Intravenous Contrast Given 12/24/19 1527)  ondansetron (ZOFRAN) injection 4 mg (4 mg Intravenous Given 12/24/19 1616)  fentaNYL (SUBLIMAZE) injection 50 mcg (50 mcg Intravenous Given 12/24/19 1616)  fentaNYL (SUBLIMAZE) injection 50 mcg (50 mcg Intramuscular Given 12/24/19 1745)    ED Course  I have reviewed the triage vital signs and the nursing notes.  Pertinent labs & imaging results that were available during my care of the patient were reviewed by me and considered in my medical decision making (see chart for details).    MDM Rules/Calculators/A&P                      85 y.o. F who presents for evaluation of lower abdominal pain that began today. Associated with nausea. No fevers, CP, SOB. Patient is afebrile, non-toxic appearing, sitting comfortably on examination table. Vital signs reviewed and stable.  She has diffuse tenderness of the  abdomen.  No rigidity, guarding.  Nonfocal.  Consider infectious etiologies such as diverticulitis versus appendicitis.  History/physical exam concerning for mesenteric ischemia.  Additionally, do not suspect ACS etiology as she has no chest pain, shortness of breath.  We will plan to check labs, urine.  CBC shows no leukocytosis. Hgb stable.  CMP shows BUN of 18, creatinine 1.24.  Otherwise unremarkable.  UA shows trace leukocytes with some bacteria.  She is currently not symptomatic.  We will plan to send for urine culture given her intermittent history of dysuria and hematuria.  Her fecal occult came as positive.  On my exam, I did not see any red blood noted on digital rectal exam or any evidence of melena.  I suspect this is from her hemorrhoid rather than acute GI bleed.  We will proceed with CT scan.  CT scan shows no acute abdominopelvic findings.  There is mention of diverticulosis without any evidence of diverticulitis.  Reevaluation.  Patient reports improvement in pain after analgesics.  Exam mild pain.  Will give additional analgesics and reassess.  RN inform me that patient was feeling better.  Reevaluation.  Patient reports pain is improved.  She is sitting comfortably on bed.  Repeat abdominal exam is improved.  Overall unclear etiology, patient appears appropriate for outpatient work-up with primary care. Discussed patient with Dr. Tyrone Nine who is agreeable to plan. At this time, patient exhibits no emergent life-threatening condition that require further evaluation in ED or admission. Patient had ample opportunity for questions and discussion. All patient's questions were answered with full understanding. Strict return precautions discussed. Patient expresses understanding and agreement to plan.    Portions of this note were generated with [employer]. Dictation errors may occur despite best attempts at proofreading.  Final Clinical Impression(s) / ED Diagnoses Final  diagnoses:  Lower abdominal pain    Rx / DC Orders ED Discharge Orders         Ordered    famotidine (PEPCID) 20 MG tablet  Daily     12/24/19 1828           Volanda Napoleon, PA-C 12/24/19 2138    Deno Etienne, DO 12/24/19 2145

## 2019-12-24 NOTE — ED Notes (Signed)
Pt finished all oral contrast to CT via stretcher

## 2019-12-24 NOTE — ED Triage Notes (Signed)
Pt c/o abdominal pain after recently having external hemorrhoid banding on Monday. Pt reports history of GI bleeding and hemorrhoids.

## 2019-12-24 NOTE — Telephone Encounter (Signed)
Pt's daughter reported that Dr. Larose Kells has advised pt to go to the ED.  She is there now.  FYI.

## 2019-12-24 NOTE — Patient Instructions (Signed)
Please go to the emergency room.

## 2019-12-24 NOTE — Progress Notes (Signed)
Subjective:    Patient ID: [name], female    DOB: [birth year], 85 y.o.   MRN: [id number]  DOS:  12/24/2019 Type of visit - description: Acute, here with her son. She has multiple concerns but by far the most worrisome is abdominal pain.  The patient had rectal bleeding frequently, had a banding procedure 12/22/2019.  In preparation to that she took MiraLAX few days ago, had diarrhea on 2/27 and 2/28. Had a banding 12/22/2019, blood per rectum has significantly decreased however she has developed abdominal pain.  Abdominal pain apparently started 12/23/2019, initially right-sided, now bilateral. The pain is steady and sharp.  She has not restarted yet Xarelto. Had a small bowel movement yesterday.  She also have for few weeks of brownish colored urine without blood.  No gross hematuria.  Occasional dysuria. Denies nausea, vomiting. No fever chills. Appetite is somewhat decreased.   Review of Systems See above   Past Medical History:  Diagnosis Date  . Abnormal echocardiogram    09/2013: mild LVH, mild focal basal hypertrophy of septum, EF 60-65%, normal WM, grade 1 diastolic dysfunction, MAC, mild LAE, ASA, PASP 34. Possible oscillating MV density - reviewed by MD and felt there was no significant abnormality other than MAC noted with mitral valve and did not require TEE.  . Adenomatous colon polyp   . Allergic rhinitis   . Arthritis    "back; ankles; hands; knees" (10/31/2013)  . Bilateral sensorineural hearing loss   . CAD (coronary artery disease)    a. Nonobst in 2011. b. Abnormal nuc 09/2013 -> s/p cutting balloon to D2, mild LAD disease; c. 08/2015 MV: no ischemia, EF 71%.  . Cancer ([place])    cancerous polyps  . Carcinoma in situ in a polyp 1994   a. 1994 - malignant polyp removed during colonoscopy.  . Charcot's Foot    a.  01/2016 s/p RLE transtibial amputation 2/2 Charcot rocker-bottom deformity and insensate neuropathy ulceration.  . Chronic diastolic CHF (congestive  heart failure) ([place])    a. 09/2013 Echo: EF 60-65%, mild LVH, Gr1 DD.  Marland Kitchen Diverticulosis   . DJD (degenerative joint disease)   . DVT (deep venous thrombosis) ([place]) 12/2007  . Dyspnea    a. Chronic, extensive w/u see OV note 09-2010. b. [place] 2011: ormal with RA 5 RV 31/2 PA 27/12 (19) PCW 10 CO normal. No evidence of shunting with sitting up in cath lab. c. CPX 2011: see report. d. Prior fluoro of diaphragm -  R diaphragm elevated at rest but both moved with inspiration.   . Fatty liver   . GERD (gastroesophageal reflux disease)    a. Hx GERD/esophageal dysmotility followed by Dr. Olevia Perches.   Marland Kitchen Head injury due to trauma   . Headache    Optic migraine  . Hemorrhoids   . Hiatal hernia    a. s/p Nissen fundoplication 2482.  Marland Kitchen Hyperlipidemia    a. patient unwilling to use statins.  . Hypertensive heart disease   . IBS (irritable bowel syndrome)   . Macular degeneration 03/2009   Dr. Rosana Hoes  . Myocardial infarction ([place]) 2017  . Neuropathy    a. Hands, feet, legs.  . Orthostatic hypotension   . Osteomyelitis ([place])    a. 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 01/2016 s/p RLE transtibial amputation 2/2 Charcot rocker-bottom deformity and insensate neuropathy ulceration.  . Osteoporosis   . PE (pulmonary embolism) 12/2007   a.  PE/DVT after neck surgery 2009. b. coumadin d/c 10-2008.  Marland Kitchen Pneumonia 2015ish  . PONV (postoperative nausea and vomiting) 2009   neck surgery  . PPD positive   . Pulmonary nodule    incidental per CT:  Pet scan 4-9: likely benign, CT 08-2009 no change, no further CTs (Dr. Gwenette Greet)  . Recurrent UTI   . RSD (reflex sympathetic dystrophy)    a. Chronic pain.  Marland Kitchen Spinal stenosis   . Type II diabetes mellitus (H[facility])    no on medication  . Venous insufficiency    a. Contributing to LEE.    Past Surgical History:  Procedure Laterality Date  . AMPUTATION  03/06/2012   Procedure: AMPUTATION FOOT;  Surgeon: Newt Minion, MD;  Location: [place];  Service: Orthopedics;  Laterality: Left;  FIFTH RAY AMPUTATION   . AMPUTATION Right 02/18/2016   Procedure: AMPUTATION BELOW KNEE;  Surgeon: Newt Minion, MD;  Location: [place];  Service: Orthopedics;  Laterality: Right;  . AMPUTATION Left 11/22/2016   Procedure: Amputation 4th Toe Left Foot at Metatarsophalangeal Joint;  Surgeon: Newt Minion, MD;  Location: [place];  Service: Orthopedics;  Laterality: Left;  . ANKLE FUSION  09/27/2012   Procedure: ANKLE FUSION;  Surgeon: Newt Minion, MD;  Location: [place];  Service: Orthopedics;  Laterality: Left;  Left Tibiocalcaneal Fusion  . ANKLE FUSION Right 05/09/2013   Procedure: ANKLE FUSION;  Surgeon: Newt Minion, MD;  Location: [place];  Service: Orthopedics;  Laterality: Right;  Excision Osteomyelitis Base 1st MT Right Foot, Fusion Medial Column  . ANTERIOR CERVICAL DECOMP/DISCECTOMY FUSION  12/18/07   For OA,  Dr. Lorin Mercy:  fu by a PE  . CARDIAC CATHETERIZATION  05/2010    at [facility]  . CARPAL TUNNEL RELEASE Left 09/2011  . CATARACT EXTRACTION W/ INTRAOCULAR LENS  IMPLANT, BILATERAL  2004   feb 2004 left, aug 2004 right  . CHOLECYSTECTOMY  03/2002  . CORONARY ANGIOPLASTY  10/31/2013  . CORONARY ARTERY BYPASS GRAFT  09/27/2016  . CYST REMOVAL HAND  06/2003  . FOOT BONE EXCISION Right 06/2009  . LAPAROSCOPIC RIGHT HEMI COLECTOMY N/A 01/08/2015   Procedure: LAPAROSCOPIC ASSISTED RIGHT HEMI COLECTOMY;  Surgeon: Johnathan Hausen, MD;  Location: [facility];  Service: General;  Laterality: N/A;  . LEFT HEART CATHETERIZATION WITH CORONARY ANGIOGRAM N/A 10/31/2013   Procedure: LEFT HEART CATHETERIZATION WITH CORONARY ANGIOGRAM;  Surgeon: Jettie Booze, MD;  Location: [facility] CATH LAB;  Service: Cardiovascular;  Laterality: N/A;  . NASAL SEPTUM SURGERY  09/1963  . NISSEN FUNDOPLICATION  01/26/5034  . PERCUTANEOUS CORONARY INTERVENTION-BALLOON ONLY  10/31/2013   Procedure: PERCUTANEOUS CORONARY INTERVENTION-BALLOON ONLY;  Surgeon: Jettie Booze, MD;  Location: [facility] CATH LAB;  Service: Cardiovascular;;  . ROTATOR CUFF REPAIR Right 07/2007   Dr. Percell Miller  . ROTATOR CUFF REPAIR Left 11/2004  . stent in left leg, behind knee  06/27/2017   x2, done at [facility] cardiology in [facility]  . TOE AMPUTATION Right 08/2008   3rd toe, Dr. Sharol Given due to osteomyelitis  . VAGINAL HYSTERECTOMY  03/1975  . VEIN LIGATION Bilateral 03/1966  . VENA CAVA FILTER PLACEMENT  01/2010   green filter; "due to blood clots"  . WISDOM TOOTH EXTRACTION  06/2007    Allergies as of 12/24/2019      Reactions   Cymbalta [duloxetine Hcl] Swelling   Swelling in legs   Gabapentin Swelling   Swelling in legs Swelling in legs  Silicone Hives, Itching, Dermatitis, Rash   Metformin And Related Other (See Comments)   dizzy, tired, chills, diarrhea, and nausea   Adhesive [tape] Rash   rash rash   Cefazolin Hives   Hives Hives   Ciprofloxacin Other (See Comments)   Other reaction(s): Other (See Comments) Per pt, caused body aches Per pt, caused body aches   Clorazepate Dipotassium Other (See Comments)   Unknown reaction   Dilaudid [hydromorphone Hcl] Other (See Comments)    confused, intense itching   Doxycycline Rash   Enablex [darifenacin Hydrobromide Er] Other (See Comments)   Hypotension, near syncope   Levofloxacin Other (See Comments)   Causes wrist pain   Lyrica [pregabalin] Swelling   Swelling in legs   Methadone Hcl Other (See Comments)   Reaction unknown   Morphine And Related Other (See Comments)   Confusion, constipation.   Talwin [pentazocine] Other (See Comments)   "climbing walls" anxiety      Medication List       Accurate as of December 24, 2019 11:59 PM. If you have any questions, ask your nurse or doctor.        Alpha-Lipoic Acid 600 MG Caps Take 600 mg by mouth 2 (two) times daily.   amitriptyline 25 MG tablet Commonly known as: ELAVIL Take 1 tablet (25 mg total) by mouth at bedtime.   atorvastatin 40 MG  tablet Commonly known as: LIPITOR Take 40 mg by mouth daily.   carvedilol 25 MG tablet Commonly known as: COREG Take 25 mg by mouth 2 (two) times daily with a meal.   ciclopirox 0.77 % cream Commonly known as: LOPROX Apply 1 application topically 2 (two) times daily as needed.   clopidogrel 75 MG tablet Commonly known as: PLAVIX Take 75 mg by mouth at bedtime.   diclofenac sodium 1 % Gel Commonly known as: VOLTAREN APPLY 2-4 GRAMS TO AFFECTED AREA 3 TIMES DAILY AS NEEDED   famotidine 20 MG tablet Commonly known as: PEPCID Take 1 tablet (20 mg total) by mouth daily.   ferrous sulfate 325 (65 FE) MG tablet Commonly known as: FeroSul Take 1 tablet (325 mg total) by mouth 2 (two) times daily with a meal.   freestyle lancets CHECK BLOOD SUGAR TWICE DAILY   FREESTYLE LITE test strip Generic drug: glucose blood USE TO CHECK BLOOD SUGAR NO MORE THAN TWICE DAILY   furosemide 20 MG tablet Commonly known as: LASIX Take 20 mg by mouth daily.   HYDROcodone-acetaminophen 5-325 MG tablet Commonly known as: NORCO/VICODIN Take 1 tablet by mouth every 6 (six) hours as needed for moderate pain.   hydrocortisone 2.5 % rectal cream Commonly known as: ANUSOL-HC Place 1 application rectally 2 (two) times daily.   hydrocortisone 25 MG suppository Commonly known as: ANUSOL-HC Place 1 suppository (25 mg total) rectally every 12 (twelve) hours.   lidocaine 5 % Commonly known as: Lidoderm Place 1 patch onto the skin daily. Remove & Discard patch within 12 hours or as directed by MD   lubiprostone 24 MCG capsule Commonly known as: [name] Take 1 capsule (24 mcg total) by mouth 2 (two) times daily with a meal.   meclizine 25 MG tablet Commonly known as: ANTIVERT Take 1 tablet (25 mg total) by mouth 2 (two) times daily as needed for dizziness.   methenamine 1 g tablet Commonly known as: HIPREX Take 1 tablet by mouth 2 (two) times daily.   miconazole 2 % cream Commonly known as:  MICOTIN Apply 1 application topically 2 (  two) times daily. For 4 weeks   nitroGLYCERIN 0.4 MG SL tablet Commonly known as: NITROSTAT Place 1 tablet (0.4 mg total) under the tongue every 5 (five) minutes x 3 doses as needed for chest pain. What changed:   when to take this  reasons to take this  additional instructions Changed by: [name], MD   omeprazole 20 MG capsule Commonly known as: PRILOSEC TAKE 1 CAPSULE(20 MG) BY MOUTH TWICE DAILY   ondansetron 4 MG disintegrating tablet Commonly known as: ZOFRAN-ODT TAKE 1 TABLET BY MOUTH EVERY 8 HOURS AS NEEDED FOR NAUSEA OR VOMITING   OVER THE COUNTER MEDICATION 1 tablet 3 (three) times daily. [place]or[place] Gluco Defense   OVER THE COUNTER MEDICATION Take 1 capsule by mouth 2 (two) times daily. Integrative Digestive Formula   pramipexole 0.125 MG tablet Commonly known as: MIRAPEX Take 1-2 tablets (0.125-0.25 mg total) by mouth at bedtime as needed.   PreserVision AREDS 2 Caps Take 1 capsule by mouth 2 (two) times daily.   REFRESH OP Place 1 drop into both eyes 3 (three) times daily as needed (dry eyes).   Vitamin B-12 500 MCG Subl Place 500 mcg under the tongue daily.   Vitamin D3 125 MCG (5000 UT) Caps Take 5,000 Units by mouth every evening. Reported on 11/24/2015   Xarelto 2.5 MG Tabs tablet Generic drug: rivaroxaban Take 2.5 mg by mouth 2 (two) times daily.             Objective:   Physical Exam Abdominal:      BP (!) 167/100 (BP Location: Left Arm, Patient Position: Sitting, Cuff Size: Normal)   Pulse (!) 120   Temp (!) 96.6 F (35.9 C) (Temporal)   Resp 18   Ht _0  (1.626 m)   Wt 191 lb 4 oz (86.8 kg) Comment: w/ braces  SpO2 98%   BMI 32.83 kg/m  General:   Well developed, not toxic appearing but she seems to be in mild to moderate pain.  Holding her hands on the abdomen. HEENT:  Normocephalic . Face symmetric, atraumatic Lungs:  CTA B Normal respiratory effort, no intercostal retractions,  no accessory muscle use. Heart: Tachycardic Abdomen:  Not distended, + TTP mostly on the right and upper abdomen.  See graphic.  Bowel sounds positive. Skin: Not pale. Not jaundice DRE: Deferred, had a recent banding procedure Lower extremities: no pretibial edema bilaterally  Neurologic:  alert & oriented X3.  Speech normal, gait consistent with previous amputations, uses walker. Psych--  Cognition and judgment appear intact.  Cooperative with normal attention span and concentration.  Behavior appropriate. No anxious or depressed appearing.     Assessment      Assessment DM, Neuropathy, amputations due to Charcot feet. - metformin intolerant  (lethargic) HTN  Hyperlipidemia LUNG MASS -incidental per CT 2009,  PET scan 01-2008: likely benign, CT 08-2009 no change, no further CTs per Dr Gwenette Greet - Had a CT in [place] 09-2016 "spiculated mass", saw Dr Lamonte Sakai,  CT  09/2017 stable CV: ---CAD sees Dr Meda Coffee and a cardiologist in [facility] --NSTEMI 09-2016. Outside hospital: Cath 2 vessel disease, CABG 2, LIMA to LAD and Diag   ---Chronic diastolic CHF ---PVD: Aortogram and a stent L Leg Dr Feliberto Gottron, 06-27-2017 @ [place] (per pt) GI: GERD, IBS, colon polyps, PUD, abdominal pain "post polypectomy syndrome" naueas meds prn (antivert-zofran, gets nausea when car traveling) Osteoporosis  MSK,pain mngmt : --Reflex sympathetic dystrophy --Neuropathy --DJD --Spinal stenosis  --H/o Osteomyelitis  Foot --s/p amputation:  4th 5th L toes , R BKA  (01-2016, dx osteomyelitis) w/ phantom pain  -- sees Dr Sharol Given prn Takes nortriptyline also for leg cramps. She had local injections with Dr. Maia Petties 2016 w/o apparent help. She took oxycodone after surgery and that did NOT seem to help better than hydrocodone. Intolerant to Cymbalta, Neurontin ; Lyrica helped but caused edema. Recurrent UTIs: Dr Junious Silk  Venous insufficiency  H/o  + PPD H/o hypothyroidism: TSHs stable H/o dyspnea  PLAN:  Abdominal  pain: Presents with abdominal pain, he seems in mild to moderate distress, she is tachycardic, afebrile.  She also have some urinary symptoms.  History of hysterectomy and cholecystectomy. Needs further evaluation given severity of the pain. I advised ER referral, patient agreed.  Discussed the case with the ER physician who agreed to see her. Recent banding procedure: Given location the pain, unlikely to be related with acute problem. HTN elevated, tachycardic: Probably related to acute pain acute pain.  Reassess on RTC Anticoagulation: Holding Xarelto and Plavix due to recent banding although she was okay to restart it 12/23/2019  This visit occurred during the SARS-CoV-2 public health emergency.  Safety protocols were in place, including screening questions prior to the visit, additional usage of staff PPE, and extensive cleaning of exam room while observing appropriate contact time as indicated for disinfecting solutions.

## 2019-12-24 NOTE — Discharge Instructions (Signed)
Follow-up with Dr. Larose Kells  Additionally, follow-up with your doctor regarding the banding of your hemorrhoid.  Return to the Emergency Department immediately if you experience any worsening abdominal pain, fever, persistent nausea and vomiting, inability keep any food down, pain with urination, blood in your urine or any other worsening or concerning symptoms.

## 2019-12-25 ENCOUNTER — Telehealth: Payer: Self-pay

## 2019-12-25 MED ORDER — HYDROCODONE-ACETAMINOPHEN 5-325 MG PO TABS: 1.0000 | ORAL_TABLET | Freq: Four times a day (QID) | ORAL | 0 refills | Status: DC | PRN

## 2019-12-25 NOTE — Telephone Encounter (Signed)
PDMP okay, MME calculated at 30, not on benzodiazepines but on amitriptyline. Will discuss Narcan at the next visit. Prescription sent

## 2019-12-25 NOTE — Assessment & Plan Note (Signed)
Abdominal pain: Presents with abdominal pain, he seems in mild to moderate distress, she is tachycardic, afebrile.  She also have some urinary symptoms.  History of hysterectomy and cholecystectomy. Needs further evaluation given severity of the pain. I advised ER referral, patient agreed.  Discussed the case with the ER physician who agreed to see her. Recent banding procedure: Given location the pain, unlikely to be related with acute problem. HTN elevated, tachycardic: Probably related to acute pain acute pain.  Reassess on RTC Anticoagulation: Holding Xarelto and Plavix due to recent banding although she was okay to restart it 12/23/2019

## 2019-12-25 NOTE — Telephone Encounter (Signed)
Hydrocodone refill.   Last OV: 12/24/19 Last Fill: 11/28/2019 #120 and 0RF Pt sig: 1 tab q6h prn UDS: 07/11/2019 Low risk

## 2019-12-27 ENCOUNTER — Ambulatory Visit: Payer: Medicare Other | Attending: Internal Medicine

## 2019-12-27 DIAGNOSIS — Z23 Encounter for immunization: Secondary | ICD-10-CM

## 2019-12-27 LAB — URINE CULTURE: Culture: <10000 — AB

## 2019-12-27 NOTE — Progress Notes (Signed)
   Covid-19 Vaccination Clinic  Name:  [name]    MRN: [id number] DOB: December 06, 1934  12/27/2019  [name] was observed post Covid-19 immunization for 15 minutes without incident. She was provided with Vaccine Information Sheet and instruction to access the V-Safe system.   [name] was instructed to call 911 with any severe reactions post vaccine: Marland Kitchen Difficulty breathing  . Swelling of face and throat  . A fast heartbeat  . A bad rash all over body  . Dizziness and weakness   Immunizations Administered    Name Date Dose VIS Date Route   Pfizer COVID-19 Vaccine 12/27/2019  3:53 PM 0.3 mL 10/03/2019 Intramuscular   Manufacturer: [place]   Lot: [id number]   [place]: [id number]

## 2019-12-29 DIAGNOSIS — M545 Low back pain: Secondary | ICD-10-CM | POA: Diagnosis not present

## 2019-12-29 DIAGNOSIS — R2689 Other abnormalities of gait and mobility: Secondary | ICD-10-CM | POA: Diagnosis not present

## 2019-12-29 DIAGNOSIS — G8929 Other chronic pain: Secondary | ICD-10-CM | POA: Diagnosis not present

## 2019-12-29 DIAGNOSIS — M542 Cervicalgia: Secondary | ICD-10-CM | POA: Diagnosis not present

## 2019-12-29 DIAGNOSIS — M6281 Muscle weakness (generalized): Secondary | ICD-10-CM | POA: Diagnosis not present

## 2019-12-29 DIAGNOSIS — G894 Chronic pain syndrome: Secondary | ICD-10-CM | POA: Diagnosis not present

## 2019-12-29 DIAGNOSIS — M25512 Pain in left shoulder: Secondary | ICD-10-CM | POA: Diagnosis not present

## 2019-12-29 DIAGNOSIS — G8921 Chronic pain due to trauma: Secondary | ICD-10-CM | POA: Diagnosis not present

## 2019-12-29 DIAGNOSIS — M25522 Pain in left elbow: Secondary | ICD-10-CM | POA: Diagnosis not present

## 2019-12-29 DIAGNOSIS — M25562 Pain in left knee: Secondary | ICD-10-CM | POA: Diagnosis not present

## 2019-12-30 ENCOUNTER — Telehealth: Payer: Self-pay | Admitting: Internal Medicine

## 2019-12-30 NOTE — Telephone Encounter (Signed)
Error

## 2019-12-31 ENCOUNTER — Telehealth: Payer: Self-pay

## 2019-12-31 NOTE — Telephone Encounter (Signed)
Patient calls with complaints of 2 nights of diarrhea. No daytime bowel movements. This started 2 days ago. She also started taking [name] 2 days ago. The diarrhea has made her rectum sore and feels like it is burning. She will hold the [name] tomorrow morning until I call back with recommendations. She will use skin balm for the abraded area at the rectum.  She had a hemorrhoid banding 10 days ago. Did not recommend anything be inserted into the rectum.  Afebrile. No recent antibiotics. Please advise.

## 2019-12-31 NOTE — Telephone Encounter (Signed)
Pt is calling about the below and request a return call

## 2020-01-01 NOTE — Telephone Encounter (Signed)
Agree with holding [name]. She can use preparation H or anusol suppository per rectum as bedtime as needed given she is almost 2 weeks s/p banding. Thanks

## 2020-01-01 NOTE — Telephone Encounter (Signed)
Called patient. No answer. Left a message to call back with an update.

## 2020-01-02 DIAGNOSIS — M545 Low back pain: Secondary | ICD-10-CM | POA: Diagnosis not present

## 2020-01-02 DIAGNOSIS — G8921 Chronic pain due to trauma: Secondary | ICD-10-CM | POA: Diagnosis not present

## 2020-01-02 DIAGNOSIS — G894 Chronic pain syndrome: Secondary | ICD-10-CM | POA: Diagnosis not present

## 2020-01-02 DIAGNOSIS — M542 Cervicalgia: Secondary | ICD-10-CM | POA: Diagnosis not present

## 2020-01-02 DIAGNOSIS — M6281 Muscle weakness (generalized): Secondary | ICD-10-CM | POA: Diagnosis not present

## 2020-01-02 DIAGNOSIS — M25562 Pain in left knee: Secondary | ICD-10-CM | POA: Diagnosis not present

## 2020-01-02 DIAGNOSIS — R2689 Other abnormalities of gait and mobility: Secondary | ICD-10-CM | POA: Diagnosis not present

## 2020-01-02 DIAGNOSIS — M25512 Pain in left shoulder: Secondary | ICD-10-CM | POA: Diagnosis not present

## 2020-01-02 DIAGNOSIS — G8929 Other chronic pain: Secondary | ICD-10-CM | POA: Diagnosis not present

## 2020-01-02 DIAGNOSIS — M25522 Pain in left elbow: Secondary | ICD-10-CM | POA: Diagnosis not present

## 2020-01-07 DIAGNOSIS — G8929 Other chronic pain: Secondary | ICD-10-CM | POA: Diagnosis not present

## 2020-01-07 DIAGNOSIS — M25522 Pain in left elbow: Secondary | ICD-10-CM | POA: Diagnosis not present

## 2020-01-07 DIAGNOSIS — M545 Low back pain: Secondary | ICD-10-CM | POA: Diagnosis not present

## 2020-01-07 DIAGNOSIS — M25512 Pain in left shoulder: Secondary | ICD-10-CM | POA: Diagnosis not present

## 2020-01-07 DIAGNOSIS — R2689 Other abnormalities of gait and mobility: Secondary | ICD-10-CM | POA: Diagnosis not present

## 2020-01-07 DIAGNOSIS — G8921 Chronic pain due to trauma: Secondary | ICD-10-CM | POA: Diagnosis not present

## 2020-01-07 DIAGNOSIS — M542 Cervicalgia: Secondary | ICD-10-CM | POA: Diagnosis not present

## 2020-01-07 DIAGNOSIS — M6281 Muscle weakness (generalized): Secondary | ICD-10-CM | POA: Diagnosis not present

## 2020-01-07 DIAGNOSIS — M25562 Pain in left knee: Secondary | ICD-10-CM | POA: Diagnosis not present

## 2020-01-07 DIAGNOSIS — G894 Chronic pain syndrome: Secondary | ICD-10-CM | POA: Diagnosis not present

## 2020-01-08 ENCOUNTER — Telehealth: Payer: Self-pay | Admitting: Internal Medicine

## 2020-01-08 NOTE — Telephone Encounter (Signed)
Caller: Staysha Feith Telephone : [phone]  12/27/2019, First Covid Shot  Patient is complaining about a Rash from [employer].   Patient is wondering about taking the second covid vaccine ?  Please Advise.

## 2020-01-09 DIAGNOSIS — M542 Cervicalgia: Secondary | ICD-10-CM | POA: Diagnosis not present

## 2020-01-09 DIAGNOSIS — G8921 Chronic pain due to trauma: Secondary | ICD-10-CM | POA: Diagnosis not present

## 2020-01-09 DIAGNOSIS — M6281 Muscle weakness (generalized): Secondary | ICD-10-CM | POA: Diagnosis not present

## 2020-01-09 DIAGNOSIS — G8929 Other chronic pain: Secondary | ICD-10-CM | POA: Diagnosis not present

## 2020-01-09 DIAGNOSIS — M25562 Pain in left knee: Secondary | ICD-10-CM | POA: Diagnosis not present

## 2020-01-09 DIAGNOSIS — M25512 Pain in left shoulder: Secondary | ICD-10-CM | POA: Diagnosis not present

## 2020-01-09 DIAGNOSIS — M25522 Pain in left elbow: Secondary | ICD-10-CM | POA: Diagnosis not present

## 2020-01-09 DIAGNOSIS — M545 Low back pain: Secondary | ICD-10-CM | POA: Diagnosis not present

## 2020-01-09 DIAGNOSIS — R2689 Other abnormalities of gait and mobility: Secondary | ICD-10-CM | POA: Diagnosis not present

## 2020-01-09 DIAGNOSIS — G894 Chronic pain syndrome: Secondary | ICD-10-CM | POA: Diagnosis not present

## 2020-01-09 NOTE — Telephone Encounter (Signed)
Please advise 

## 2020-01-09 NOTE — Telephone Encounter (Signed)
Arrange a visit, in person if possible if not virtual but will need pictures or a good quality camera. Urgent care if symptoms severe

## 2020-01-09 NOTE — Telephone Encounter (Signed)
Spoke w/ Pts' son-(Pt was asleep)- informed that we would need to see Pt to determine if rash is caused by COVID vaccine. Pt is in [facility]- recommended they see someone there if symptoms really bad. Pt's son verbalized understanding.

## 2020-01-14 DIAGNOSIS — G8921 Chronic pain due to trauma: Secondary | ICD-10-CM | POA: Diagnosis not present

## 2020-01-14 DIAGNOSIS — M25562 Pain in left knee: Secondary | ICD-10-CM | POA: Diagnosis not present

## 2020-01-14 DIAGNOSIS — M25522 Pain in left elbow: Secondary | ICD-10-CM | POA: Diagnosis not present

## 2020-01-14 DIAGNOSIS — R2689 Other abnormalities of gait and mobility: Secondary | ICD-10-CM | POA: Diagnosis not present

## 2020-01-14 DIAGNOSIS — M6281 Muscle weakness (generalized): Secondary | ICD-10-CM | POA: Diagnosis not present

## 2020-01-14 DIAGNOSIS — G894 Chronic pain syndrome: Secondary | ICD-10-CM | POA: Diagnosis not present

## 2020-01-14 DIAGNOSIS — M25512 Pain in left shoulder: Secondary | ICD-10-CM | POA: Diagnosis not present

## 2020-01-14 DIAGNOSIS — G8929 Other chronic pain: Secondary | ICD-10-CM | POA: Diagnosis not present

## 2020-01-14 DIAGNOSIS — M545 Low back pain: Secondary | ICD-10-CM | POA: Diagnosis not present

## 2020-01-14 DIAGNOSIS — M542 Cervicalgia: Secondary | ICD-10-CM | POA: Diagnosis not present

## 2020-01-16 ENCOUNTER — Encounter: Payer: Self-pay | Admitting: Internal Medicine

## 2020-01-16 DIAGNOSIS — G894 Chronic pain syndrome: Secondary | ICD-10-CM | POA: Diagnosis not present

## 2020-01-16 DIAGNOSIS — M542 Cervicalgia: Secondary | ICD-10-CM | POA: Diagnosis not present

## 2020-01-16 DIAGNOSIS — R2689 Other abnormalities of gait and mobility: Secondary | ICD-10-CM | POA: Diagnosis not present

## 2020-01-16 DIAGNOSIS — M6281 Muscle weakness (generalized): Secondary | ICD-10-CM | POA: Diagnosis not present

## 2020-01-16 DIAGNOSIS — M25522 Pain in left elbow: Secondary | ICD-10-CM | POA: Diagnosis not present

## 2020-01-16 DIAGNOSIS — G8921 Chronic pain due to trauma: Secondary | ICD-10-CM | POA: Diagnosis not present

## 2020-01-16 DIAGNOSIS — G8929 Other chronic pain: Secondary | ICD-10-CM | POA: Diagnosis not present

## 2020-01-16 DIAGNOSIS — M25512 Pain in left shoulder: Secondary | ICD-10-CM | POA: Diagnosis not present

## 2020-01-16 DIAGNOSIS — M545 Low back pain: Secondary | ICD-10-CM | POA: Diagnosis not present

## 2020-01-16 DIAGNOSIS — M25562 Pain in left knee: Secondary | ICD-10-CM | POA: Diagnosis not present

## 2020-01-21 DIAGNOSIS — G8921 Chronic pain due to trauma: Secondary | ICD-10-CM | POA: Diagnosis not present

## 2020-01-21 DIAGNOSIS — G8929 Other chronic pain: Secondary | ICD-10-CM | POA: Diagnosis not present

## 2020-01-21 DIAGNOSIS — M545 Low back pain: Secondary | ICD-10-CM | POA: Diagnosis not present

## 2020-01-21 DIAGNOSIS — G894 Chronic pain syndrome: Secondary | ICD-10-CM | POA: Diagnosis not present

## 2020-01-21 DIAGNOSIS — M542 Cervicalgia: Secondary | ICD-10-CM | POA: Diagnosis not present

## 2020-01-21 DIAGNOSIS — M25512 Pain in left shoulder: Secondary | ICD-10-CM | POA: Diagnosis not present

## 2020-01-21 DIAGNOSIS — R2689 Other abnormalities of gait and mobility: Secondary | ICD-10-CM | POA: Diagnosis not present

## 2020-01-21 DIAGNOSIS — M25562 Pain in left knee: Secondary | ICD-10-CM | POA: Diagnosis not present

## 2020-01-21 DIAGNOSIS — M25522 Pain in left elbow: Secondary | ICD-10-CM | POA: Diagnosis not present

## 2020-01-21 DIAGNOSIS — M6281 Muscle weakness (generalized): Secondary | ICD-10-CM | POA: Diagnosis not present

## 2020-01-23 DIAGNOSIS — M6281 Muscle weakness (generalized): Secondary | ICD-10-CM | POA: Diagnosis not present

## 2020-01-23 DIAGNOSIS — M542 Cervicalgia: Secondary | ICD-10-CM | POA: Diagnosis not present

## 2020-01-23 DIAGNOSIS — G8929 Other chronic pain: Secondary | ICD-10-CM | POA: Diagnosis not present

## 2020-01-23 DIAGNOSIS — G8921 Chronic pain due to trauma: Secondary | ICD-10-CM | POA: Diagnosis not present

## 2020-01-23 DIAGNOSIS — R2689 Other abnormalities of gait and mobility: Secondary | ICD-10-CM | POA: Diagnosis not present

## 2020-01-23 DIAGNOSIS — M545 Low back pain: Secondary | ICD-10-CM | POA: Diagnosis not present

## 2020-01-23 DIAGNOSIS — G894 Chronic pain syndrome: Secondary | ICD-10-CM | POA: Diagnosis not present

## 2020-01-23 DIAGNOSIS — M25562 Pain in left knee: Secondary | ICD-10-CM | POA: Diagnosis not present

## 2020-01-23 DIAGNOSIS — M25522 Pain in left elbow: Secondary | ICD-10-CM | POA: Diagnosis not present

## 2020-01-23 DIAGNOSIS — M25512 Pain in left shoulder: Secondary | ICD-10-CM | POA: Diagnosis not present

## 2020-01-26 ENCOUNTER — Telehealth: Payer: Self-pay | Admitting: Gastroenterology

## 2020-01-26 NOTE — Telephone Encounter (Signed)
Called patient back. No answer. Left a message to return my call.

## 2020-01-27 ENCOUNTER — Ambulatory Visit: Payer: Medicare Other

## 2020-01-27 ENCOUNTER — Encounter: Payer: Self-pay | Admitting: Gastroenterology

## 2020-01-27 ENCOUNTER — Ambulatory Visit (INDEPENDENT_AMBULATORY_CARE_PROVIDER_SITE_OTHER): Payer: Medicare Other | Admitting: Gastroenterology

## 2020-01-27 VITALS — BP 118/82 | HR 104 | Temp 97.0°F | Ht 64.0 in

## 2020-01-27 DIAGNOSIS — K641 Second degree hemorrhoids: Secondary | ICD-10-CM | POA: Diagnosis not present

## 2020-01-27 MED ORDER — DICYCLOMINE HCL 10 MG PO CAPS: 10.0000 mg | ORAL_CAPSULE | Freq: Three times a day (TID) | ORAL | 3 refills | Status: DC

## 2020-01-27 NOTE — Patient Instructions (Addendum)
We have sent the following medications to your pharmacy for you to pick up at your convenience: dicyclomine.   Continue Benefiber and Miralax as needed  Turn side to side and avoid sitting in one position no more then 2-3 hours at a time.   Restart Xarelto and Plavix tomorrow.   HEMORRHOID BANDING PROCEDURE    FOLLOW-UP CARE   1. The procedure you have had should have been relatively painless since the banding of the area involved does not have nerve endings and there is no pain sensation.  The rubber band cuts off the blood supply to the hemorrhoid and the band may fall off as soon as 48 hours after the banding (the band may occasionally be seen in the toilet bowl following a bowel movement). You may notice a temporary feeling of fullness in the rectum which should respond adequately to plain Tylenol or Motrin.  2. Following the banding, avoid strenuous exercise that evening and resume full activity the next day.  A sitz bath (soaking in a warm tub) or bidet is soothing, and can be useful for cleansing the area after bowel movements.     3. To avoid constipation, take two tablespoons of natural wheat bran, natural oat bran, flax, Benefiber or any over the counter fiber supplement and increase your water intake to 7-8 glasses daily.    4. Unless you have been prescribed anorectal medication, do not put anything inside your rectum for two weeks: No suppositories, enemas, fingers, etc.  5. Occasionally, you may have more bleeding than usual after the banding procedure.  This is often from the untreated hemorrhoids rather than the treated one.  Don't be concerned if there is a tablespoon or so of blood.  If there is more blood than this, lie flat with your bottom higher than your head and apply an ice pack to the area. If the bleeding does not stop within a half an hour or if you feel faint, call our office at [phone] or go to the emergency room.  6. Problems are not common; however,  if there is a substantial amount of bleeding, severe pain, chills, fever or difficulty passing urine (very rare) or other problems, you should call us at [phone] or report to the nearest emergency room.  7. Do not stay seated continuously for more than 2-3 hours for a day or two after the procedure.  Tighten your buttock muscles 10-15 times every two hours and take 10-15 deep breaths every 1-2 hours.  Do not spend more than a few minutes on the toilet if you cannot empty your bowel; instead re-visit the toilet at a later time.

## 2020-01-27 NOTE — Progress Notes (Signed)
PROCEDURE NOTE: The patient presents with symptomatic grade 2 hemorrhoids, requesting rubber band ligation of his/her hemorrhoidal disease.  All risks, benefits and alternative forms of therapy were described and informed consent was obtained.   The anorectum was pre-medicated with 0.125% nitroglycerin and RectiCare The decision was made to band the left lateral internal hemorrhoid, and the [place] was used to perform band ligation without complication.  Digital anorectal examination was then performed to assure proper positioning of the band, and to adjust the banded tissue as required.  The patient was discharged home without pain or other issues.  Dietary and behavioral recommendations were given and along with follow-up instructions.     The following adjunctive treatments were recommended: Benefiber 1 tablespoon TID with meals Miralax 1 capful daily as needed Restart Xarelto and Plavix tomorrow Take dicyclomine 10 mg every 8 hours as needed for abdominal discomfort and cramping Turn side to side when laying down, avoid sitting for prolonged period of time in a single position for greater than 2 hours at a time  Return as needed  No complications were encountered and the patient tolerated the procedure well.  Damaris Hippo , MD [phone]

## 2020-01-29 ENCOUNTER — Ambulatory Visit: Payer: Self-pay

## 2020-01-29 ENCOUNTER — Encounter: Payer: Self-pay | Admitting: Physician Assistant

## 2020-01-29 ENCOUNTER — Other Ambulatory Visit: Payer: Self-pay

## 2020-01-29 ENCOUNTER — Encounter: Payer: Self-pay | Admitting: Internal Medicine

## 2020-01-29 ENCOUNTER — Ambulatory Visit (INDEPENDENT_AMBULATORY_CARE_PROVIDER_SITE_OTHER): Payer: Medicare Other | Admitting: Physician Assistant

## 2020-01-29 ENCOUNTER — Telehealth: Payer: Self-pay | Admitting: Internal Medicine

## 2020-01-29 DIAGNOSIS — N644 Mastodynia: Secondary | ICD-10-CM | POA: Diagnosis not present

## 2020-01-29 DIAGNOSIS — R928 Other abnormal and inconclusive findings on diagnostic imaging of breast: Secondary | ICD-10-CM | POA: Diagnosis not present

## 2020-01-29 DIAGNOSIS — M542 Cervicalgia: Secondary | ICD-10-CM | POA: Diagnosis not present

## 2020-01-29 LAB — HM MAMMOGRAPHY

## 2020-01-29 MED ORDER — PREDNISONE 10 MG PO TABS: 10.0000 mg | ORAL_TABLET | Freq: Every day | ORAL | 1 refills | Status: DC

## 2020-01-29 MED ORDER — HYDROCODONE-ACETAMINOPHEN 5-325 MG PO TABS: 1.0000 | ORAL_TABLET | Freq: Four times a day (QID) | ORAL | 0 refills | Status: DC | PRN

## 2020-01-29 NOTE — Telephone Encounter (Signed)
Hydrocodone refill.   Last OV: 12/24/2019 Last Fill: 12/25/2019 #120 and 0RF Pt sig: 1 tab q6h prn UDS: 07/11/2019 Low risk

## 2020-01-29 NOTE — Telephone Encounter (Signed)
Patient is in [place] currently wants to re fill med , HYDROCODONE . [facility]  [address], [address] [place] ... [address]. Please advise. Patient wants a phone call  [phone]

## 2020-01-29 NOTE — Telephone Encounter (Signed)
PDMP okay, Rx sent 

## 2020-01-29 NOTE — Progress Notes (Signed)
Nurse connected with patient 01/30/20 at  2:30 PM EDT by a telephone enabled telemedicine application and verified that I am speaking with the correct person using two identifiers. Patient stated full name and DOB. Patient gave permission to continue with virtual visit. Patient's location was at home and Nurse's location was at [place] office.   Subjective:   [name] is a 85 y.o. female who presents for Medicare Annual (Subsequent) preventive examination.  Review of Systems:   Home Safety/Smoke Alarms: Feels safe in home. Smoke alarms in place.  Uses walker.  Stays w/ daughter or son at all times.    Female:       Mammo-  08/14/19     Dexa scan-  01/08/17. Pt states she will schedule.           Objective:     Vitals: Unable to assess. This visit is enabled though telemedicine due to Covid 19.   Advanced Directives 01/30/2020 12/24/2019 12/14/2016 11/22/2016 02/21/2016 01/08/2015 01/08/2015  Does Patient Have a Medical Advance Directive? No No No (No Data) No No -  Does patient want to make changes to medical advance directive? - - Yes (MAU/Ambulatory/Procedural Areas - Information given) - - - -  Would patient like information on creating a medical advance directive? No - Patient declined No - Patient declined - - No - patient declined information No - patient declined information No - patient declined information  Pre-existing out of facility DNR order (yellow form or pink MOST form) - - - - - - -    Tobacco Social History   Tobacco Use  Smoking Status Never Smoker  Smokeless Tobacco Never Used  Tobacco Comment   tried for a few months in college.      Counseling given: Not Answered Comment: tried for a few months in college.    Clinical Intake: Pain : No/denies pain     Past Medical History:  Diagnosis Date  . Abnormal echocardiogram    09/2013: mild LVH, mild focal basal hypertrophy of septum, EF 60-65%, normal WM, grade 1 diastolic dysfunction, MAC, mild LAE, ASA,  PASP 34. Possible oscillating MV density - reviewed by MD and felt there was no significant abnormality other than MAC noted with mitral valve and did not require TEE.  . Adenomatous colon polyp   . Allergic rhinitis   . Arthritis    "back; ankles; hands; knees" (10/31/2013)  . Bilateral sensorineural hearing loss   . CAD (coronary artery disease)    a. Nonobst in 2011. b. Abnormal nuc 09/2013 -> s/p cutting balloon to D2, mild LAD disease; c. 08/2015 MV: no ischemia, EF 71%.  . Cancer ([place])    cancerous polyps  . Carcinoma in situ in a polyp 1994   a. 1994 - malignant polyp removed during colonoscopy.  . Charcot's Foot    a.  01/2016 s/p RLE transtibial amputation 2/2 Charcot rocker-bottom deformity and insensate neuropathy ulceration.  . Chronic diastolic CHF (congestive heart failure) ([place])    a. 09/2013 Echo: EF 60-65%, mild LVH, Gr1 DD.  Marland Kitchen Diverticulosis   . DJD (degenerative joint disease)   . DVT (deep venous thrombosis) ([place]) 12/2007  . Dyspnea    a. Chronic, extensive w/u see OV note 09-2010. b. [place] 2011: ormal with RA 5 RV 31/2 PA 27/12 (19) PCW 10 CO normal. No evidence of shunting with sitting up in cath lab. c. CPX 2011: see report. d. Prior fluoro of diaphragm -  R diaphragm elevated at  rest but both moved with inspiration.   . Fatty liver   . GERD (gastroesophageal reflux disease)    a. Hx GERD/esophageal dysmotility followed by Dr. Olevia Perches.   Marland Kitchen Head injury due to trauma   . Headache    Optic migraine  . Hemorrhoids   . Hiatal hernia    a. s/p Nissen fundoplication 4403.  Marland Kitchen Hyperlipidemia    a. patient unwilling to use statins.  . Hypertensive heart disease   . IBS (irritable bowel syndrome)   . Macular degeneration 03/2009   Dr. Rosana Hoes  . Myocardial infarction ([place]) 2017  . Neuropathy    a. Hands, feet, legs.  . Orthostatic hypotension   . Osteomyelitis ([place])    a. 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  01/2016 s/p RLE transtibial amputation 2/2 Charcot rocker-bottom deformity and insensate neuropathy ulceration.  . Osteoporosis   . PE (pulmonary embolism) 12/2007   a. PE/DVT after neck surgery 2009. b. coumadin d/c 10-2008.  Marland Kitchen Pneumonia 2015ish  . PONV (postoperative nausea and vomiting) 2009   neck surgery  . PPD positive   . Pulmonary nodule    incidental per CT:  Pet scan 4-9: likely benign, CT 08-2009 no change, no further CTs (Dr. Gwenette Greet)  . Recurrent UTI   . RSD (reflex sympathetic dystrophy)    a. Chronic pain.  Marland Kitchen Spinal stenosis   . Type II diabetes mellitus (H[facility])    no on medication  . Venous insufficiency    a. Contributing to LEE.   Past Surgical History:  Procedure Laterality Date  . AMPUTATION  03/06/2012   Procedure: AMPUTATION FOOT;  Surgeon: Newt Minion, MD;  Location: [place];  Service: Orthopedics;  Laterality: Left;  FIFTH RAY AMPUTATION   . AMPUTATION Right 02/18/2016   Procedure: AMPUTATION BELOW KNEE;  Surgeon: Newt Minion, MD;  Location: [place];  Service: Orthopedics;  Laterality: Right;  . AMPUTATION Left 11/22/2016   Procedure: Amputation 4th Toe Left Foot at Metatarsophalangeal Joint;  Surgeon: Newt Minion, MD;  Location: [place];  Service: Orthopedics;  Laterality: Left;  . ANKLE FUSION  09/27/2012   Procedure: ANKLE FUSION;  Surgeon: Newt Minion, MD;  Location: [place];  Service: Orthopedics;  Laterality: Left;  Left Tibiocalcaneal Fusion  . ANKLE FUSION Right 05/09/2013   Procedure: ANKLE FUSION;  Surgeon: Newt Minion, MD;  Location: [place];  Service: Orthopedics;  Laterality: Right;  Excision Osteomyelitis Base 1st MT Right Foot, Fusion Medial Column  . ANTERIOR CERVICAL DECOMP/DISCECTOMY FUSION  12/18/07   For OA,  Dr. Lorin Mercy:  fu by a PE  . CARDIAC CATHETERIZATION  05/2010    at [facility]  . CARPAL TUNNEL RELEASE Left 09/2011  . CATARACT EXTRACTION W/ INTRAOCULAR LENS  IMPLANT, BILATERAL  2004   feb 2004 left, aug 2004 right  . CHOLECYSTECTOMY  03/2002    . CORONARY ANGIOPLASTY  10/31/2013  . CORONARY ARTERY BYPASS GRAFT  09/27/2016  . CYST REMOVAL HAND  06/2003  . FOOT BONE EXCISION Right 06/2009  . LAPAROSCOPIC RIGHT HEMI COLECTOMY N/A 01/08/2015   Procedure: LAPAROSCOPIC ASSISTED RIGHT HEMI COLECTOMY;  Surgeon: Johnathan Hausen, MD;  Location: [facility];  Service: General;  Laterality: N/A;  . LEFT HEART CATHETERIZATION WITH CORONARY ANGIOGRAM N/A 10/31/2013   Procedure: LEFT HEART CATHETERIZATION WITH CORONARY ANGIOGRAM;  Surgeon: Jettie Booze, MD;  [facility];  Service: Cardiovascular;  Laterality: N/A;  . NASAL  SEPTUM SURGERY  09/1963  . NISSEN FUNDOPLICATION  0/12/4740  . PERCUTANEOUS CORONARY INTERVENTION-BALLOON ONLY  10/31/2013   Procedure: PERCUTANEOUS CORONARY INTERVENTION-BALLOON ONLY;  Surgeon: Jettie Booze, MD;  Location: [facility] CATH LAB;  Service: Cardiovascular;;  . ROTATOR CUFF REPAIR Right 07/2007   Dr. Percell Miller  . ROTATOR CUFF REPAIR Left 11/2004  . stent in left leg, behind knee  06/27/2017   x2, done at [facility] in [facility]  . TOE AMPUTATION Right 08/2008   3rd toe, Dr. Sharol Given due to osteomyelitis  . VAGINAL HYSTERECTOMY  03/1975  . VEIN LIGATION Bilateral 03/1966  . VENA CAVA FILTER PLACEMENT  01/2010   green filter; "due to blood clots"  . WISDOM TOOTH EXTRACTION  06/2007   Family History  Problem Relation Age of Onset  . Heart disease Mother        mitral valve replaced  . Arthritis Mother   . Diabetic kidney disease Daughter   . Diabetes Father   . Stroke Father   . Migraines Sister   . Hypertension Other   . Breast cancer Other   . Colon cancer Neg Hx   . Esophageal cancer Neg Hx   . Stomach cancer Neg Hx   . Rectal cancer Neg Hx    Social History   Socioeconomic History  . Marital status: Divorced    Spouse name: Not on file  . Number of children: 3  . Years of education: 20  . Highest education level: Not on file  Occupational History  . Occupation: Retired     Comment: from  Orthoptist   Tobacco Use  . Smoking status: Never Smoker  . Smokeless tobacco: Never Used  . Tobacco comment: tried for a few months in college.   Substance and Sexual Activity  . Alcohol use: No    Alcohol/week: 0.0 standard drinks  . Drug use: No  . Sexual activity: Not Currently    Birth control/protection: Post-menopausal  Other Topics Concern  . Not on file  Social History Narrative   Daughter lives w/ her and another daughter lives in town   1 son in [place]   Son comes home every 4 weeks to visit   Caffeine use:  Tea 1/day w/ dinner   Coffee occass          Social Determinants of Health   Financial Resource Strain: Low Risk   . Difficulty of Paying Living Expenses: Not hard at all  Food Insecurity: No Food Insecurity  . Worried About Charity fundraiser in the Last Year: Never true  . Ran Out of Food in the Last Year: Never true  Transportation Needs: No Transportation Needs  . Lack of Transportation (Medical): No  . Lack of Transportation (Non-Medical): No  Physical Activity:   . Days of Exercise per Week:   . Minutes of Exercise per Session:   Stress:   . Feeling of Stress :   Social Connections:   . Frequency of Communication with Friends and Family:   . Frequency of Social Gatherings with Friends and Family:   . Attends Religious Services:   . Active Member of Clubs or Organizations:   . Attends Archivist Meetings:   Marland Kitchen Marital Status:     Outpatient Encounter Medications as of 01/30/2020  Medication Sig  . Alpha-Lipoic Acid 600 MG CAPS Take 600 mg by mouth 2 (two) times daily.  Marland Kitchen amitriptyline (ELAVIL) 25 MG tablet Take 1 tablet (25 mg total)  by mouth at bedtime.  Marland Kitchen atorvastatin (LIPITOR) 40 MG tablet Take 40 mg by mouth daily.  . carvedilol (COREG) 25 MG tablet Take 25 mg by mouth 2 (two) times daily with a meal.  . Cholecalciferol (VITAMIN D3) 5000 UNITS CAPS Take 5,000 Units by mouth every evening. Reported on 11/24/2015  . ciclopirox (LOPROX)  0.77 % cream Apply 1 application topically 2 (two) times daily as needed.  . clopidogrel (PLAVIX) 75 MG tablet Take 75 mg by mouth at bedtime.   . Cyanocobalamin (VITAMIN B-12) 500 MCG SUBL Place 500 mcg under the tongue daily.  . diclofenac sodium (VOLTAREN) 1 % GEL APPLY 2-4 GRAMS TO AFFECTED AREA 3 TIMES DAILY AS NEEDED  . dicyclomine (BENTYL) 10 MG capsule Take 1 capsule (10 mg total) by mouth 3 (three) times daily before meals.  . famotidine (PEPCID) 20 MG tablet Take 1 tablet (20 mg total) by mouth daily.  . ferrous sulfate (FEROSUL) 325 (65 FE) MG tablet Take 1 tablet (325 mg total) by mouth 2 (two) times daily with a meal.  . furosemide (LASIX) 20 MG tablet Take 20 mg by mouth daily.  Marland Kitchen glucose blood (FREESTYLE LITE) test strip USE TO CHECK BLOOD SUGAR NO MORE THAN TWICE DAILY  . HYDROcodone-acetaminophen (NORCO/VICODIN) 5-325 MG tablet Take 1 tablet by mouth every 6 (six) hours as needed for moderate pain.  . hydrocortisone (ANUSOL-HC) 2.5 % rectal cream Place 1 application rectally 2 (two) times daily.  . hydrocortisone (ANUSOL-HC) 25 MG suppository Place 1 suppository (25 mg total) rectally every 12 (twelve) hours.  . Lancets (FREESTYLE) lancets CHECK BLOOD SUGAR TWICE DAILY  . lidocaine (LIDODERM) 5 % Place 1 patch onto the skin daily. Remove & Discard patch within 12 hours or as directed by MD  . lubiprostone ([name]) 24 MCG capsule Take 1 capsule (24 mcg total) by mouth 2 (two) times daily with a meal.  . meclizine (ANTIVERT) 25 MG tablet Take 1 tablet (25 mg total) by mouth 2 (two) times daily as needed for dizziness.  . methenamine (HIPREX) 1 g tablet Take 1 tablet by mouth 2 (two) times daily.  . miconazole (MICOTIN) 2 % cream Apply 1 application topically 2 (two) times daily. For 4 weeks  . Multiple Vitamins-Minerals (PRESERVISION AREDS 2) CAPS Take 1 capsule by mouth 2 (two) times daily.  Marland Kitchen omeprazole (PRILOSEC) 20 MG capsule TAKE 1 CAPSULE(20 MG) BY MOUTH TWICE DAILY  .  ondansetron (ZOFRAN-ODT) 4 MG disintegrating tablet TAKE 1 TABLET BY MOUTH EVERY 8 HOURS AS NEEDED FOR NAUSEA OR VOMITING  . OVER THE COUNTER MEDICATION Take 1 capsule by mouth 2 (two) times daily. Integrative Digestive Formula  . OVER THE COUNTER MEDICATION 1 tablet 3 (three) times daily. Berberorine Gluco Defense   . Polyvinyl Alcohol-Povidone (REFRESH OP) Place 1 drop into both eyes 3 (three) times daily as needed (dry eyes).   . pramipexole (MIRAPEX) 0.125 MG tablet Take 1-2 tablets (0.125-0.25 mg total) by mouth at bedtime as needed.  . predniSONE (DELTASONE) 10 MG tablet Take 1 tablet (10 mg total) by mouth daily with breakfast.  . rivaroxaban (XARELTO) 2.5 MG TABS tablet Take 2.5 mg by mouth 2 (two) times daily.  . nitroGLYCERIN (NITROSTAT) 0.4 MG SL tablet Place 1 tablet (0.4 mg total) under the tongue every 5 (five) minutes x 3 doses as needed for chest pain. (Patient not taking: Reported on 01/30/2020)  . [DISCONTINUED] HYDROcodone-acetaminophen (NORCO/VICODIN) 5-325 MG tablet Take 1 tablet by mouth every 6 (six) hours as  needed for moderate pain.   No facility-administered encounter medications on file as of 01/30/2020.    Activities of Daily Living In your present state of health, do you have any difficulty performing the following activities: 01/30/2020 07/11/2019  Hearing? N N  Vision? N N  Difficulty concentrating or making decisions? N N  Walking or climbing stairs? Y Y  Dressing or bathing? N N  Doing errands, shopping? Y N  Preparing Food and eating ? Y -  Using the Toilet? N -  In the past six months, have you accidently leaked urine? N -  Do you have problems with loss of bowel control? N -  Managing your Medications? N -  Managing your Finances? Y -  Housekeeping or managing your Housekeeping? N -  Some recent data might be hidden    Patient Care Team: Colon Branch, MD as PCP - General Dorothy Spark, MD as Consulting Physician (Cardiology) Melvenia Beam, MD as  Consulting Physician (Neurology) Newt Minion, MD as Consulting Physician (Orthopedic Surgery) Elmore Guise, MD as Consulting Physician (Vascular Surgery) Janyth Contes, MD as Consulting Physician (Cardiology) Lonia Skinner, MD as Consulting Physician (Ophthalmology) Festus Aloe, MD as Consulting Physician (Urology) Lonia Skinner, MD as Consulting Physician (Ophthalmology)    Assessment:   This is a routine wellness examination for Maneh. Physical assessment deferred to PCP.  Exercise Activities and Dietary recommendations Current Exercise Habits: Structured exercise class, Time (Minutes): 30, Frequency (Times/Week): 2, Weekly Exercise (Minutes/Week): 60 Diet (meal preparation, eat out, water intake, caffeinated beverages, dairy products, fruits and vegetables): 24 hr recall Breakfast: coffee, sausage and egg biscuit Lunch: skipped Dinner: ham, salad, potatoes.  Goals    . Increase mobility     Start walking again.       Fall Risk Fall Risk  01/30/2020 08/22/2019 08/18/2019 01/18/2018 12/14/2016  Falls in the past year? _0 Exclusion - non ambulatory Yes  Number falls in past yr: _1 - 2 or more  Comment - - - - -  Injury with Fall? 0 1 1 - No  Comment - - - - -  Risk Factor Category  - - - - High Fall Risk  Risk for fall due to : - - - - Impaired balance/gait;Impaired mobility;History of fall(s)  Follow up Education provided;Falls prevention discussed Falls evaluation completed;Education provided;Falls prevention discussed - - Falls evaluation completed;Falls prevention discussed   Depression Screen PHQ 2/9 Scores 01/30/2020 01/18/2018 12/14/2016 09/18/2016  PHQ - 2 Score 0 0 0 0     Cognitive Function Ad8 score reviewed for issues:  Issues making decisions:no  Less interest in hobbies / activities:no  Repeats questions, stories (family complaining):no  Trouble using ordinary gadgets (microwave, computer, phone):no  Forgets the month or year:  no  Mismanaging finances: no  Remembering appts:no  Daily problems with thinking and/or memory:no Ad8 score is=0   MMSE - Mini Mental State Exam 12/14/2016  Orientation to time 5  Orientation to Place 5  Registration 3  Attention/ Calculation 5  Recall 2  Language- name 2 objects 2  Language- repeat 1  Language- follow 3 step command 3  Language- read & follow direction 1  Write a sentence 1  Copy design 1  Total score 29        Immunization History  Administered Date(s) Administered  . Fluad Quad(high Dose 65+) 07/11/2019  . Influenza Split 08/07/2012  . Influenza Whole 07/22/2008, 07/26/2009  . Influenza, High Dose  Seasonal PF 10/15/2015, 09/18/2016, 07/23/2017, 08/21/2018  . Influenza, Seasonal, Injecte, Preservative Fre 07/23/2013  . Influenza,inj,Quad PF,6+ Mos 10/06/2014  . PFIZER SARS-COV-2 Vaccination 12/27/2019  . Pneumococcal Conjugate-13 10/06/2014  . Pneumococcal Polysaccharide-23 09/28/2010  . Tetanus 09/17/2013  . Zoster 09/18/2011  . Zoster Recombinat (Shingrix) 10/11/2018, 12/12/2018    Screening Tests Health Maintenance  Topic Date Due  . HEMOGLOBIN A1C  01/08/2020  . OPHTHALMOLOGY EXAM  05/01/2020  . INFLUENZA VACCINE  05/23/2020  . COLONOSCOPY  12/28/2020  . MAMMOGRAM  01/28/2022  . TETANUS/TDAP  09/18/2023  . DEXA SCAN  Completed  . PNA vac Low Risk Adult  Completed       Plan:    Please schedule your next medicare wellness visit with me in 1 yr.  Continue to eat heart healthy diet (full of fruits, vegetables, whole grains, lean protein, water--limit salt, fat, and sugar intake) and increase physical activity as tolerated.  Continue doing brain stimulating activities (puzzles, reading, adult coloring books, staying active) to keep memory sharp.    I have personally reviewed and noted the following in the patient's chart:   . Medical and social history . Use of alcohol, tobacco or illicit drugs  . Current medications and  supplements . Functional ability and status . Nutritional status . Physical activity . Advanced directives . List of other physicians . Hospitalizations, surgeries, and ER visits in previous 12 months . Vitals . Screenings to include cognitive, depression, and falls . Referrals and appointments  In addition, I have reviewed and discussed with patient certain preventive protocols, quality metrics, and best practice recommendations. A written personalized care plan for preventive services as well as general preventive health recommendations were provided to patient.     Shela Nevin, [place]  01/30/2020

## 2020-01-29 NOTE — Progress Notes (Signed)
Office Visit Note   Patient: [name]           Date of Birth: Jan 13, 1935           MRN: [id number] Visit Date: 01/29/2020              Requested by: Colon Branch, [address] [place],  [place] [address] PCP: Colon Branch, MD  Chief Complaint  Patient presents with  . Left Shoulder - Pain  . Neck - Pain      HPI: This is a pleasant 85 year old woman who is status post right below-knee amputation and has a brace for foot drop on the left.  She comes in today as she is complaining of neck pain that radiates into her left shoulder.  She is currently living with family in [place] but has a home here.  She has taken multiple falls on stairs.  She said this has been because of difficulty in using her prosthetic and she cannot seem to get her foot up high enough.  She is working with physical therapy in [place] to improve this.  She has a history of an anterior cervical fusion by report about 10 years ago she denies any weakness just pain in her neck that radiates to her left shoulder that wakes her up at night she has tried hydrocodone does not seem to help her that much  Assessment & Plan: Visit Diagnoses:  1. Neck pain     Plan: I have given her a prescription for prednisone 10 mg with breakfast daily.  I think it is imperative that she continue to work with physical therapy and have encouraged that she if she returns to [place] I think she should work with Shirlean Mylar on improving her gait.  She will follow-up in 4 weeks  Follow-Up Instructions: No follow-ups on file.   Ortho Exam  Patient is alert, oriented, no adenopathy, well-dressed, normal affect, normal respiratory effort. Left shoulder: No tenderness in the shoulder joint.  She has full forward flexion external rotation internal rotation is just slightly decreased.  No pain with any of these maneuvers that she has excellent strength with resisted abduction.  Negative impingement findings.  She  does have some tenderness in the neck.  Mild tenderness in the neck.  No palpable deformity no swelling grip strengths arm strength is equal bilaterally sensation is intact bilaterally  Imaging: No results found. No images are attached to the encounter.  Labs: Lab Results  Component Value Date   HGBA1C 6.3 07/11/2019   HGBA1C 6.2 02/28/2019   HGBA1C 6.5 05/20/2018   ESRSEDRATE 9 06/12/2014   ESRSEDRATE 12 05/13/2014   REPTSTATUS 12/27/2019 FINAL 12/24/2019   CULT (A) 12/24/2019    <10,000 COLONIES/mL INSIGNIFICANT GROWTH Performed at [facility], [place] [address] [place], [place] [address]    [place] 09/18/2016     Lab Results  Component Value Date   ALBUMIN 4.2 12/24/2019   ALBUMIN 4.2 07/11/2019   ALBUMIN 4.3 02/28/2019    Lab Results  Component Value Date   MG 1.5 03/30/2016   Lab Results  Component Value Date   VD25OH 65 10/20/2011    No results found for: PREALBUMIN CBC EXTENDED Latest Ref Rng & Units 12/24/2019 08/22/2019 02/28/2019  WBC 4.0 - 10.5 K/uL 5.0 3.7(L) 4.0  RBC 3.87 - 5.11 MIL/uL 4.22 4.07 4.06  HGB 12.0 - 15.0 g/dL 12.4 12.5 12.7  HCT 36.0 -  46.0 % 40.0 37.8 38.3  PLT 150 - 400 K/uL 160 155.0 148.0(L)  NEUTROABS 1.7 - 7.7 K/uL 3.2 2.0 2.3  LYMPHSABS 0.7 - 4.0 K/uL 1.2 1.3 1.4     There is no height or weight on file to calculate BMI.  Orders:  Orders Placed This Encounter  Procedures  . XR Shoulder Left  . XR Cervical Spine 2 or 3 views   Meds ordered this encounter  Medications  . predniSONE (DELTASONE) 10 MG tablet    Sig: Take 1 tablet (10 mg total) by mouth daily with breakfast.    Dispense:  30 tablet    Refill:  1     Procedures: No procedures performed  Clinical Data: No additional findings.  ROS:  All other systems negative, except as noted in the HPI. Review of Systems  Objective: Vital Signs: There were no vitals taken for this visit.  Specialty Comments:  No specialty comments  available.  PMFS History: Patient Active Problem List   Diagnosis Date Noted  . External hemorrhoids 02/15/2018  . History of three vessel coronary artery bypass - 2017 , in [facility] 09/25/2017  . PVD (peripheral vascular disease) ([place])-- stent L leg 06-2017 Dr Feliberto Gottron [facility] 07/23/2017  . Phantom pain ([place]) 04/01/2017  . Constipation, chronic 04/01/2017  . S/P unilateral BKA (below knee amputation), right ([place]) 12/14/2016  . Toe osteomyelitis, left ([place]) 11/17/2016  . Acute on chronic diastolic CHF (congestive heart failure), NYHA class 3 ([place]) 03/30/2016  . Chest pain 03/30/2016  . Coronary artery disease due to lipid rich plaque 03/30/2016  . Statin intolerance 03/30/2016  . Acute renal failure superimposed on stage 3 chronic kidney disease ([place]) 03/30/2016  . S/P below knee amputation ([place]) 02/18/2016  . Head trauma 11/26/2015  . Memory loss 11/26/2015  . PCP NOTES >>>>>>>>>>>>>>>>>>>> 10/18/2015  . S/P partial colectomy 01/08/2015  . 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 Anemia 01/18/2014  . Rectal bleeding 10/31/2013  . CAD (coronary artery disease)   . Carcinoma in situ in a polyp   . Depression 09/17/2013  . Charcot's joint 08/21/2013  . Foot osteomyelitis, right ([place]) 07/16/2013  . Chronic pain associated with significant psychosocial dysfunction 05/06/2013  . Neuropathy   . Hyperthyroidism   . Lower extremity edema 03/08/2011  . Annual physical exam 03/08/2011  . ALLERGIC RHINITIS 04/15/2010  . ORTHOSTATIC DIZZINESS 04/13/2010  . HIATAL HERNIA 12/06/2009  . GASTRIC ULCER, HX OF 12/06/2009  . DYSPNEA 03/18/2009  . DEGENERATIVE JOINT DISEASE 06/03/2008  . COLONIC POLYPS 01/27/2008  . DIVERTICULOSIS, COLON 01/27/2008  . IRRITABLE BOWEL SYNDROME, HX OF 01/27/2008  . Pulmonary nodule, right 01/22/2008  . Hyperlipidemia 12/30/2007  . Osteoporosis 12/30/2007  . TB SKIN TEST, POSITIVE 08/09/2007  . DM type 2 with diabetic peripheral neuropathy  ([place]) 05/09/2007  . Reflex sympathetic dystrophy-- PAIN MNGMT  05/09/2007  . Essential hypertension 04/15/2007  . GERD 04/15/2007   Past Medical History:  Diagnosis Date  . Abnormal echocardiogram    09/2013: mild LVH, mild focal basal hypertrophy of septum, EF 60-65%, normal WM, grade 1 diastolic dysfunction, MAC, mild LAE, ASA, PASP 34. Possible oscillating MV density - reviewed by MD and felt there was no significant abnormality other than MAC noted with mitral valve and did not require TEE.  . Adenomatous colon polyp   . Allergic rhinitis   . Arthritis    "back; ankles; hands; knees" (10/31/2013)  . Bilateral sensorineural hearing loss   . CAD (coronary  artery disease)    a. Nonobst in 2011. b. Abnormal nuc 09/2013 -> s/p cutting balloon to D2, mild LAD disease; c. 08/2015 MV: no ischemia, EF 71%.  . Cancer ([place])    cancerous polyps  . Carcinoma in situ in a polyp 1994   a. 1994 - malignant polyp removed during colonoscopy.  . Charcot's Foot    a.  01/2016 s/p RLE transtibial amputation 2/2 Charcot rocker-bottom deformity and insensate neuropathy ulceration.  . Chronic diastolic CHF (congestive heart failure) ([place])    a. 09/2013 Echo: EF 60-65%, mild LVH, Gr1 DD.  Marland Kitchen Diverticulosis   . DJD (degenerative joint disease)   . DVT (deep venous thrombosis) ([place]) 12/2007  . Dyspnea    a. Chronic, extensive w/u see OV note 09-2010. b. [place] 2011: ormal with RA 5 RV 31/2 PA 27/12 (19) PCW 10 CO normal. No evidence of shunting with sitting up in cath lab. c. CPX 2011: see report. d. Prior fluoro of diaphragm -  R diaphragm elevated at rest but both moved with inspiration.   . Fatty liver   . GERD (gastroesophageal reflux disease)    a. Hx GERD/esophageal dysmotility followed by Dr. Olevia Perches.   Marland Kitchen Head injury due to trauma   . Headache    Optic migraine  . Hemorrhoids   . Hiatal hernia    a. s/p Nissen fundoplication 4174.  Marland Kitchen Hyperlipidemia    a. patient unwilling to use statins.  .  Hypertensive heart disease   . IBS (irritable bowel syndrome)   . Macular degeneration 03/2009   Dr. Rosana Hoes  . Myocardial infarction ([place]) 2017  . Neuropathy    a. Hands, feet, legs.  . Orthostatic hypotension   . Osteomyelitis ([place])    a. 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 01/2016 s/p RLE transtibial amputation 2/2 Charcot rocker-bottom deformity and insensate neuropathy ulceration.  . Osteoporosis   . PE (pulmonary embolism) 12/2007   a. PE/DVT after neck surgery 2009. b. coumadin d/c 10-2008.  Marland Kitchen Pneumonia 2015ish  . PONV (postoperative nausea and vomiting) 2009   neck surgery  . PPD positive   . Pulmonary nodule    incidental per CT:  Pet scan 4-9: likely benign, CT 08-2009 no change, no further CTs (Dr. Gwenette Greet)  . Recurrent UTI   . RSD (reflex sympathetic dystrophy)    a. Chronic pain.  Marland Kitchen Spinal stenosis   . Type II diabetes mellitus (HCC)    no on medication  . Venous insufficiency    a. Contributing to LEE.    Family History  Problem Relation Age of Onset  . Heart disease Mother        mitral valve replaced  . Arthritis Mother   . Diabetic kidney disease Daughter   . Diabetes Father   . Stroke Father   . Migraines Sister   . Hypertension Other   . Breast cancer Other   . Colon cancer Neg Hx   . Esophageal cancer Neg Hx   . Stomach cancer Neg Hx   . Rectal cancer Neg Hx     Past Surgical History:  Procedure Laterality Date  . AMPUTATION  03/06/2012   Procedure: AMPUTATION FOOT;  Surgeon: Newt Minion, MD;  Location: [facility];  Service: Orthopedics;  Laterality: Left;  FIFTH RAY AMPUTATION   . AMPUTATION Right 02/18/2016   Procedure: AMPUTATION BELOW KNEE;  Surgeon: Newt Minion, MD;  Location: [facility];  Service: Orthopedics;  Laterality: Right;  . AMPUTATION Left 11/22/2016   Procedure: Amputation 4th Toe Left Foot at Metatarsophalangeal Joint;  Surgeon: Newt Minion, MD;  Location: [facility];  Service:  Orthopedics;  Laterality: Left;  . ANKLE FUSION  09/27/2012   Procedure: ANKLE FUSION;  Surgeon: Newt Minion, MD;  Location: [facility];  Service: Orthopedics;  Laterality: Left;  Left Tibiocalcaneal Fusion  . ANKLE FUSION Right 05/09/2013   Procedure: ANKLE FUSION;  Surgeon: Newt Minion, MD;  Location: [facility];  Service: Orthopedics;  Laterality: Right;  Excision Osteomyelitis Base 1st MT Right Foot, Fusion Medial Column  . ANTERIOR CERVICAL DECOMP/DISCECTOMY FUSION  12/18/07   For OA,  Dr. Lorin Mercy:  fu by a PE  . CARDIAC CATHETERIZATION  05/2010    at [facility]  . CARPAL TUNNEL RELEASE Left 09/2011  . CATARACT EXTRACTION W/ INTRAOCULAR LENS  IMPLANT, BILATERAL  2004   feb 2004 left, aug 2004 right  . CHOLECYSTECTOMY  03/2002  . CORONARY ANGIOPLASTY  10/31/2013  . CORONARY ARTERY BYPASS GRAFT  09/27/2016  . CYST REMOVAL HAND  06/2003  . FOOT BONE EXCISION Right 06/2009  . LAPAROSCOPIC RIGHT HEMI COLECTOMY N/A 01/08/2015   Procedure: LAPAROSCOPIC ASSISTED RIGHT HEMI COLECTOMY;  Surgeon: Johnathan Hausen, MD;  Location: [facility];  Service: General;  Laterality: N/A;  . LEFT HEART CATHETERIZATION WITH CORONARY ANGIOGRAM N/A 10/31/2013   Procedure: LEFT HEART CATHETERIZATION WITH CORONARY ANGIOGRAM;  Surgeon: Jettie Booze, MD;  Location[facility];  Service: Cardiovascular;  Laterality: N/A;  . NASAL SEPTUM SURGERY  09/1963  . NISSEN FUNDOPLICATION  12/30/7671  . PERCUTANEOUS CORONARY INTERVENTION-BALLOON ONLY  10/31/2013   Procedure: PERCUTANEOUS CORONARY INTERVENTION-BALLOON ONLY;  Surgeon: Jettie Booze, MD;  Location: [facility] LAB;  Service: Cardiovascular;;  . ROTATOR CUFF REPAIR Right 07/2007   Dr. Percell Miller  . ROTATOR CUFF REPAIR Left 11/2004  . stent in left leg, behind knee  06/27/2017   x2, done at [facility] in [facility]  . TOE AMPUTATION Right 08/2008   3rd toe, Dr. Sharol Given due to osteomyelitis  . VAGINAL HYSTERECTOMY  03/1975  . VEIN LIGATION Bilateral 03/1966  . VENA CAVA FILTER  PLACEMENT  01/2010   green filter; "due to blood clots"  . WISDOM TOOTH EXTRACTION  06/2007   Social History   Occupational History  . Occupation: Retired     Comment: from Orthoptist   Tobacco Use  . Smoking status: Never Smoker  . Smokeless tobacco: Never Used  . Tobacco comment: tried for a few months in college.   Substance and Sexual Activity  . Alcohol use: No    Alcohol/week: 0.0 standard drinks  . Drug use: No  . Sexual activity: Not Currently    Birth control/protection: Post-menopausal

## 2020-01-30 ENCOUNTER — Encounter: Payer: Self-pay | Admitting: *Deleted

## 2020-01-30 ENCOUNTER — Ambulatory Visit (INDEPENDENT_AMBULATORY_CARE_PROVIDER_SITE_OTHER): Payer: Medicare Other | Admitting: *Deleted

## 2020-01-30 ENCOUNTER — Encounter: Payer: Self-pay | Admitting: Internal Medicine

## 2020-01-30 DIAGNOSIS — Z0000 Encounter for general adult medical examination without abnormal findings: Secondary | ICD-10-CM | POA: Diagnosis not present

## 2020-01-30 NOTE — Patient Instructions (Signed)
Please schedule your next medicare wellness visit with me in 1 yr.  Continue to eat heart healthy diet (full of fruits, vegetables, whole grains, lean protein, water--limit salt, fat, and sugar intake) and increase physical activity as tolerated.  Continue doing brain stimulating activities (puzzles, reading, adult coloring books, staying active) to keep memory sharp.    [name] , Thank you for taking time to come for your Medicare Wellness Visit. I appreciate your ongoing commitment to your health goals. Please review the following plan we discussed and let me know if I can assist you in the future.   These are the goals we discussed: Goals    . Increase mobility     Start walking again.       This is a list of the screening recommended for you and due dates:  Health Maintenance  Topic Date Due  . Hemoglobin A1C  01/08/2020  . Eye exam for diabetics  05/01/2020  . Flu Shot  05/23/2020  . Colon Cancer Screening  12/28/2020  . Mammogram  01/28/2022  . Tetanus Vaccine  09/18/2023  . DEXA scan (bone density measurement)  Completed  . Pneumonia vaccines  Completed    Preventive Care 76 Years and Older, Female Preventive care refers to lifestyle choices and visits with your health care provider that can promote health and wellness. This includes:  A yearly physical exam. This is also called an annual well check.  Regular dental and eye exams.  Immunizations.  Screening for certain conditions.  Healthy lifestyle choices, such as diet and exercise. What can I expect for my preventive care visit? Physical exam Your health care provider will check:  Height and weight. These may be used to calculate body mass index (BMI), which is a measurement that tells if you are at a healthy weight.  Heart rate and blood pressure.  Your skin for abnormal spots. Counseling Your health care provider may ask you questions about:  Alcohol, tobacco, and drug use.  Emotional  well-being.  Home and relationship well-being.  Sexual activity.  Eating habits.  History of falls.  Memory and ability to understand (cognition).  Work and work Statistician.  Pregnancy and menstrual history. What immunizations do I need?  Influenza (flu) vaccine  This is recommended every year. Tetanus, diphtheria, and pertussis (Tdap) vaccine  You may need a Td booster every 10 years. Varicella (chickenpox) vaccine  You may need this vaccine if you have not already been vaccinated. Zoster (shingles) vaccine  You may need this after age 40. Pneumococcal conjugate (PCV13) vaccine  One dose is recommended after age 22. Pneumococcal polysaccharide (PPSV23) vaccine  One dose is recommended after age 17. Measles, mumps, and rubella (MMR) vaccine  You may need at least one dose of MMR if you were born in 1957 or later. You may also need a second dose. Meningococcal conjugate (MenACWY) vaccine  You may need this if you have certain conditions. Hepatitis A vaccine  You may need this if you have certain conditions or if you travel or work in places where you may be exposed to hepatitis A. Hepatitis B vaccine  You may need this if you have certain conditions or if you travel or work in places where you may be exposed to hepatitis B. Haemophilus influenzae type b (Hib) vaccine  You may need this if you have certain conditions. You may receive vaccines as individual doses or as more than one vaccine together in one shot (combination vaccines). Talk with your  health care provider about the risks and benefits of combination vaccines. What tests do I need? Blood tests  Lipid and cholesterol levels. These may be checked every 5 years, or more frequently depending on your overall health.  Hepatitis C test.  Hepatitis B test. Screening  Lung cancer screening. You may have this screening every year starting at age 64 if you have a 30-pack-year history of smoking and  currently smoke or have quit within the past 15 years.  Colorectal cancer screening. All adults should have this screening starting at age 8 and continuing until age 83. Your health care provider may recommend screening at age 80 if you are at increased risk. You will have tests every 1-10 years, depending on your results and the type of screening test.  Diabetes screening. This is done by checking your blood sugar (glucose) after you have not eaten for a while (fasting). You may have this done every 1-3 years.  Mammogram. This may be done every 1-2 years. Talk with your health care provider about how often you should have regular mammograms.  BRCA-related cancer screening. This may be done if you have a family history of breast, ovarian, tubal, or peritoneal cancers. Other tests  Sexually transmitted disease (STD) testing.  Bone density scan. This is done to screen for osteoporosis. You may have this done starting at age 35. Follow these instructions at home: Eating and drinking  Eat a diet that includes fresh fruits and vegetables, whole grains, lean protein, and low-fat dairy products. Limit your intake of foods with high amounts of sugar, saturated fats, and salt.  Take vitamin and mineral supplements as recommended by your health care provider.  Do not drink alcohol if your health care provider tells you not to drink.  If you drink alcohol: ? Limit how much you have to 0-1 drink a day. ? Be aware of how much alcohol is in your drink. In the [place] one drink equals one 12 oz bottle of beer (355 mL), one 5 oz glass of wine (148 mL), or one 1 oz glass of hard liquor (44 mL). Lifestyle  Take daily care of your teeth and gums.  Stay active. Exercise for at least 30 minutes on 5 or more days each week.  Do not use any products that contain nicotine or tobacco, such as cigarettes, e-cigarettes, and chewing tobacco. If you need help quitting, ask your health care provider.  If you are  sexually active, practice safe sex. Use a condom or other form of protection in order to prevent STIs (sexually transmitted infections).  Talk with your health care provider about taking a low-dose aspirin or statin. What's next?  Go to your health care provider once a year for a well check visit.  Ask your health care provider how often you should have your eyes and teeth checked.  Stay up to date on all vaccines. This information is not intended to replace advice given to you by your health care provider. Make sure you discuss any questions you have with your health care provider. Document Revised: 10/03/2018 Document Reviewed: 10/03/2018 Elsevier Patient Education  2020 [employer].

## 2020-01-31 ENCOUNTER — Ambulatory Visit: Payer: Medicare Other | Attending: Internal Medicine

## 2020-01-31 DIAGNOSIS — Z23 Encounter for immunization: Secondary | ICD-10-CM

## 2020-01-31 NOTE — Progress Notes (Signed)
   Covid-19 Vaccination Clinic  Name:  CAROLYNN BOOTHBY    MRN: [id number] DOB: 1935-02-26  01/31/2020  Ms. Wanless was observed post Covid-19 immunization for 15 minutes without incident. She was provided with Vaccine Information Sheet and instruction to access the V-Safe system.   Ms. Toms was instructed to call 911 with any severe reactions post vaccine: Marland Kitchen Difficulty breathing  . Swelling of face and throat  . A fast heartbeat  . A bad rash all over body  . Dizziness and weakness   Immunizations Administered    Name Date Dose VIS Date Route   Pfizer COVID-19 Vaccine 01/31/2020  9:48 AM 0.3 mL 10/03/2019 Intramuscular   Manufacturer: [place]   Lot: [id number]   [employer]: [id number]

## 2020-02-04 DIAGNOSIS — E782 Mixed hyperlipidemia: Secondary | ICD-10-CM | POA: Diagnosis not present

## 2020-02-04 DIAGNOSIS — E119 Type 2 diabetes mellitus without complications: Secondary | ICD-10-CM | POA: Diagnosis not present

## 2020-02-04 DIAGNOSIS — I10 Essential (primary) hypertension: Secondary | ICD-10-CM | POA: Diagnosis not present

## 2020-02-04 DIAGNOSIS — I25728 Atherosclerosis of autologous artery coronary artery bypass graft(s) with other forms of angina pectoris: Secondary | ICD-10-CM | POA: Diagnosis not present

## 2020-02-09 DIAGNOSIS — I6523 Occlusion and stenosis of bilateral carotid arteries: Secondary | ICD-10-CM | POA: Diagnosis not present

## 2020-02-09 DIAGNOSIS — I70213 Atherosclerosis of native arteries of extremities with intermittent claudication, bilateral legs: Secondary | ICD-10-CM | POA: Diagnosis not present

## 2020-02-13 DIAGNOSIS — I70213 Atherosclerosis of native arteries of extremities with intermittent claudication, bilateral legs: Secondary | ICD-10-CM | POA: Diagnosis not present

## 2020-02-13 DIAGNOSIS — I6523 Occlusion and stenosis of bilateral carotid arteries: Secondary | ICD-10-CM | POA: Diagnosis not present

## 2020-02-17 ENCOUNTER — Other Ambulatory Visit: Payer: Self-pay

## 2020-02-17 MED ORDER — PRAMIPEXOLE DIHYDROCHLORIDE 0.125 MG PO TABS: 0.1250 mg | ORAL_TABLET | Freq: Every evening | ORAL | 1 refills | Status: DC | PRN

## 2020-02-18 DIAGNOSIS — E782 Mixed hyperlipidemia: Secondary | ICD-10-CM | POA: Diagnosis not present

## 2020-02-18 DIAGNOSIS — I10 Essential (primary) hypertension: Secondary | ICD-10-CM | POA: Diagnosis not present

## 2020-02-18 DIAGNOSIS — E119 Type 2 diabetes mellitus without complications: Secondary | ICD-10-CM | POA: Diagnosis not present

## 2020-02-20 ENCOUNTER — Encounter: Payer: Self-pay | Admitting: Physician Assistant

## 2020-02-20 ENCOUNTER — Ambulatory Visit (INDEPENDENT_AMBULATORY_CARE_PROVIDER_SITE_OTHER): Payer: Medicare Other | Admitting: Physician Assistant

## 2020-02-20 ENCOUNTER — Other Ambulatory Visit: Payer: Self-pay

## 2020-02-20 VITALS — Ht 64.0 in | Wt 180.0 lb

## 2020-02-20 DIAGNOSIS — M542 Cervicalgia: Secondary | ICD-10-CM

## 2020-02-20 NOTE — Progress Notes (Signed)
Office Visit Note   Patient: [name]           Date of Birth: 1934-11-26           MRN: [id number] Visit Date: 02/20/2020              Requested by: Colon Branch, [address] [place],  [place] [address] PCP: Colon Branch, MD  Chief Complaint  Patient presents with  . Neck - Pain      HPI: This is a pleasant 85 year old woman who follows up today for her neck pain.  She has been taking 10 mg of prednisone daily and is found it helpful.  Occasionally the neck pain radiates down to her arm but it is not particularly bothersome to her.  She is returning to [place] and will be having a surgical procedure on her leg next week  Assessment & Plan: Visit Diagnoses: No diagnosis found.  Plan: I would like her to start tapering the prednisone to 1 every other day and then 1 every few days.  If she had significant return of her symptoms I would recommend evaluation and possible steroid injection.  She could facilitate this with her doctors in [place] or I am happy to help her with this locally  Follow-Up Instructions: No follow-ups on file.   Ortho Exam  Patient is alert, oriented, no adenopathy, well-dressed, normal affect, normal respiratory effort. Focused examination no tenderness in the neck good motion with range of motion no radiation of pain no weakness in the grip strength no paresthesias  Imaging: No results found. No images are attached to the encounter.  Labs: Lab Results  Component Value Date   HGBA1C 6.3 07/11/2019   HGBA1C 6.2 02/28/2019   HGBA1C 6.5 05/20/2018   ESRSEDRATE 9 06/12/2014   ESRSEDRATE 12 05/13/2014   REPTSTATUS 12/27/2019 FINAL 12/24/2019   CULT (A) 12/24/2019    <10,000 COLONIES/mL INSIGNIFICANT GROWTH Performed at [facility], [place] [address], [place], [place] [address]    LABORGA KLEBSIELLA PNEUMONIAE 09/18/2016     Lab Results  Component Value Date   ALBUMIN 4.2 12/24/2019   ALBUMIN 4.2  07/11/2019   ALBUMIN 4.3 02/28/2019    Lab Results  Component Value Date   MG 1.5 03/30/2016   Lab Results  Component Value Date   VD25OH 65 10/20/2011    No results found for: PREALBUMIN CBC EXTENDED Latest Ref Rng & Units 12/24/2019 08/22/2019 02/28/2019  WBC 4.0 - 10.5 K/uL 5.0 3.7(L) 4.0  RBC 3.87 - 5.11 MIL/uL 4.22 4.07 4.06  HGB 12.0 - 15.0 g/dL 12.4 12.5 12.7  HCT 36.0 - 46.0 % 40.0 37.8 38.3  PLT 150 - 400 K/uL 160 155.0 148.0(L)  NEUTROABS 1.7 - 7.7 K/uL 3.2 2.0 2.3  LYMPHSABS 0.7 - 4.0 K/uL 1.2 1.3 1.4     Body mass index is 30.9 kg/m.  Orders:  No orders of the defined types were placed in this encounter.  No orders of the defined types were placed in this encounter.    Procedures: No procedures performed  Clinical Data: No additional findings.  ROS:  All other systems negative, except as noted in the HPI. Review of Systems  Objective: Vital Signs: Ht _0  (1.626 m)   Wt 180 lb (81.6 kg)   BMI 30.90 kg/m   Specialty Comments:  No specialty comments available.  PMFS History: Patient Active Problem List   Diagnosis Date Noted  .  External hemorrhoids 02/15/2018  . History of three vessel coronary artery bypass - 2017 , in [facility] 09/25/2017  . PVD (peripheral vascular disease) ([place])-- stent L leg 06-2017 Dr Feliberto [facility] 07/23/2017  . Phantom pain ([place]) 04/01/2017  . Constipation, chronic 04/01/2017  . S/P unilateral BKA (below knee amputation), right ([place]) 12/14/2016  . Toe osteomyelitis, left ([place]) 11/17/2016  . Acute on chronic diastolic CHF (congestive heart failure), NYHA class 3 ([place]) 03/30/2016  . Chest pain 03/30/2016  . Coronary artery disease due to lipid rich plaque 03/30/2016  . Statin intolerance 03/30/2016  . Acute renal failure superimposed on stage 3 chronic kidney disease ([place]) 03/30/2016  . S/P below knee amputation ([place]) 02/18/2016  . Head trauma 11/26/2015  . Memory loss 11/26/2015  . PCP NOTES >>>>>>>>>>>>>>>>>>>> 10/18/2015    . S/P partial colectomy 01/08/2015  . 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 Anemia 01/18/2014  . Rectal bleeding 10/31/2013  . CAD (coronary artery disease)   . Carcinoma in situ in a polyp   . Depression 09/17/2013  . Charcot's joint 08/21/2013  . Foot osteomyelitis, right ([place]) 07/16/2013  . Chronic pain associated with significant psychosocial dysfunction 05/06/2013  . Neuropathy   . Hyperthyroidism   . Lower extremity edema 03/08/2011  . Annual physical exam 03/08/2011  . ALLERGIC RHINITIS 04/15/2010  . ORTHOSTATIC DIZZINESS 04/13/2010  . HIATAL HERNIA 12/06/2009  . GASTRIC ULCER, HX OF 12/06/2009  . DYSPNEA 03/18/2009  . DEGENERATIVE JOINT DISEASE 06/03/2008  . COLONIC POLYPS 01/27/2008  . DIVERTICULOSIS, COLON 01/27/2008  . IRRITABLE BOWEL SYNDROME, HX OF 01/27/2008  . Pulmonary nodule, right 01/22/2008  . Hyperlipidemia 12/30/2007  . Osteoporosis 12/30/2007  . TB SKIN TEST, POSITIVE 08/09/2007  . DM type 2 with diabetic peripheral neuropathy ([place]) 05/09/2007  . Reflex sympathetic dystrophy-- PAIN MNGMT  05/09/2007  . Essential hypertension 04/15/2007  . GERD 04/15/2007   Past Medical History:  Diagnosis Date  . Abnormal echocardiogram    09/2013: mild LVH, mild focal basal hypertrophy of septum, EF 60-65%, normal WM, grade 1 diastolic dysfunction, MAC, mild LAE, ASA, PASP 34. Possible oscillating MV density - reviewed by MD and felt there was no significant abnormality other than MAC noted with mitral valve and did not require TEE.  . Adenomatous colon polyp   . Allergic rhinitis   . Arthritis    "back; ankles; hands; knees" (10/31/2013)  . Bilateral sensorineural hearing loss   . CAD (coronary artery disease)    a. Nonobst in 2011. b. Abnormal nuc 09/2013 -> s/p cutting balloon to D2, mild LAD disease; c. 08/2015 MV: no ischemia, EF 71%.  . Cancer ([place])    cancerous polyps  . Carcinoma in situ in a polyp 1994   a. 1994 - malignant  polyp removed during colonoscopy.  . Charcot's Foot    a.  01/2016 s/p RLE transtibial amputation 2/2 Charcot rocker-bottom deformity and insensate neuropathy ulceration.  . Chronic diastolic CHF (congestive heart failure) ([place])    a. 09/2013 Echo: EF 60-65%, mild LVH, Gr1 DD.  Marland Kitchen Diverticulosis   . DJD (degenerative joint disease)   . DVT (deep venous thrombosis) ([place]) 12/2007  . Dyspnea    a. Chronic, extensive w/u see OV note 09-2010. b. [place] 2011: ormal with RA 5 RV 31/2 PA 27/12 (19) PCW 10 CO normal. No evidence of shunting with sitting up in cath lab. c. CPX 2011: see report. d. Prior fluoro of diaphragm -  R diaphragm elevated at rest  but both moved with inspiration.   . Fatty liver   . GERD (gastroesophageal reflux disease)    a. Hx GERD/esophageal dysmotility followed by Dr. Olevia Perches.   Marland Kitchen Head injury due to trauma   . Headache    Optic migraine  . Hemorrhoids   . Hiatal hernia    a. s/p Nissen fundoplication 7510.  Marland Kitchen Hyperlipidemia    a. patient unwilling to use statins.  . Hypertensive heart disease   . IBS (irritable bowel syndrome)   . Macular degeneration 03/2009   Dr. Rosana Hoes  . Myocardial infarction ([place]) 2017  . Neuropathy    a. Hands, feet, legs.  . Orthostatic hypotension   . Osteomyelitis ([place])    a. 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 01/2016 s/p RLE transtibial amputation 2/2 Charcot rocker-bottom deformity and insensate neuropathy ulceration.  . Osteoporosis   . PE (pulmonary embolism) 12/2007   a. PE/DVT after neck surgery 2009. b. coumadin d/c 10-2008.  Marland Kitchen Pneumonia 2015ish  . PONV (postoperative nausea and vomiting) 2009   neck surgery  . PPD positive   . Pulmonary nodule    incidental per CT:  Pet scan 4-9: likely benign, CT 08-2009 no change, no further CTs (Dr. Gwenette Greet)  . Recurrent UTI   . RSD (reflex sympathetic dystrophy)    a. Chronic pain.  Marland Kitchen Spinal stenosis   . Type II diabetes mellitus  (HCC)    no on medication  . Venous insufficiency    a. Contributing to LEE.    Family History  Problem Relation Age of Onset  . Heart disease Mother        mitral valve replaced  . Arthritis Mother   . Diabetic kidney disease Daughter   . Diabetes Father   . Stroke Father   . Migraines Sister   . Hypertension Other   . Breast cancer Other   . Colon cancer Neg Hx   . Esophageal cancer Neg Hx   . Stomach cancer Neg Hx   . Rectal cancer Neg Hx     Past Surgical History:  Procedure Laterality Date  . AMPUTATION  03/06/2012   Procedure: AMPUTATION FOOT;  Surgeon: Newt Minion, MD;  Location: [facility];  Service: Orthopedics;  Laterality: Left;  FIFTH RAY AMPUTATION   . AMPUTATION Right 02/18/2016   Procedure: AMPUTATION BELOW KNEE;  Surgeon: Newt Minion, MD;  Location: [facility];  Service: Orthopedics;  Laterality: Right;  . AMPUTATION Left 11/22/2016   Procedure: Amputation 4th Toe Left Foot at Metatarsophalangeal Joint;  Surgeon: Newt Minion, MD;  Location: [facility];  Service: Orthopedics;  Laterality: Left;  . ANKLE FUSION  09/27/2012   Procedure: ANKLE FUSION;  Surgeon: Newt Minion, MD;  Location: [facility];  Service: Orthopedics;  Laterality: Left;  Left Tibiocalcaneal Fusion  . ANKLE FUSION Right 05/09/2013   Procedure: ANKLE FUSION;  Surgeon: Newt Minion, MD;  Location: [facility];  Service: Orthopedics;  Laterality: Right;  Excision Osteomyelitis Base 1st MT Right Foot, Fusion Medial Column  . ANTERIOR CERVICAL DECOMP/DISCECTOMY FUSION  12/18/07   For OA,  Dr. Lorin Mercy:  fu by a PE  . CARDIAC CATHETERIZATION  05/2010    at [facility]  . CARPAL TUNNEL RELEASE Left 09/2011  . CATARACT EXTRACTION W/ INTRAOCULAR LENS  IMPLANT, BILATERAL  2004   feb 2004 left, aug 2004 right  . CHOLECYSTECTOMY  03/2002  . CORONARY ANGIOPLASTY  10/31/2013  .  CORONARY ARTERY BYPASS GRAFT  09/27/2016  . CYST REMOVAL HAND  06/2003  . FOOT BONE EXCISION Right 06/2009  . LAPAROSCOPIC RIGHT HEMI COLECTOMY N/A 01/08/2015    Procedure: LAPAROSCOPIC ASSISTED RIGHT HEMI COLECTOMY;  Surgeon: Johnathan Hausen, MD;  Location: [facility];  Service: General;  Laterality: N/A;  . LEFT HEART CATHETERIZATION WITH CORONARY ANGIOGRAM N/A 10/31/2013   Procedure: LEFT HEART CATHETERIZATION WITH CORONARY ANGIOGRAM;  Surgeon: Jettie Booze, MD;  Location: [facility] LAB;  Service: Cardiovascular;  Laterality: N/A;  . NASAL SEPTUM SURGERY  09/1963  . NISSEN FUNDOPLICATION  1/0/2585  . PERCUTANEOUS CORONARY INTERVENTION-BALLOON ONLY  10/31/2013   Procedure: PERCUTANEOUS CORONARY INTERVENTION-BALLOON ONLY;  Surgeon: Jettie Booze, MD;  Location: [facility] CATH LAB;  Service: Cardiovascular;;  . ROTATOR CUFF REPAIR Right 07/2007   Dr. Percell Miller  . ROTATOR CUFF REPAIR Left 11/2004  . stent in left leg, behind knee  06/27/2017   x2, done at [facility]ology in [facility]  . TOE AMPUTATION Right 08/2008   3rd toe, Dr. Sharol Given due to osteomyelitis  . VAGINAL HYSTERECTOMY  03/1975  . VEIN LIGATION Bilateral 03/1966  . VENA CAVA FILTER PLACEMENT  01/2010   green filter; "due to blood clots"  . WISDOM TOOTH EXTRACTION  06/2007   Social History   Occupational History  . Occupation: Retired     Comment: from Orthoptist   Tobacco Use  . Smoking status: Never Smoker  . Smokeless tobacco: Never Used  . Tobacco comment: tried for a few months in college.   Substance and Sexual Activity  . Alcohol use: No    Alcohol/week: 0.0 standard drinks  . Drug use: No  . Sexual activity: Not Currently    Birth control/protection: Post-menopausal

## 2020-02-23 ENCOUNTER — Telehealth: Payer: Self-pay | Admitting: Family Medicine

## 2020-02-23 ENCOUNTER — Ambulatory Visit: Payer: Medicare Other | Admitting: Physician Assistant

## 2020-02-23 DIAGNOSIS — R110 Nausea: Secondary | ICD-10-CM

## 2020-02-23 MED ORDER — ONDANSETRON 4 MG PO TBDP: ORAL_TABLET | ORAL | 0 refills | Status: DC

## 2020-02-23 NOTE — Telephone Encounter (Signed)
Pt called wanting a refill on her ondansetron (ZOFRAN-ODT) 4 MG disintegrating tablet sent in to the [facility] in [place], [place] Pt is needing this sent in as soon as possible since she will be leaving [place] tomorrow. Please advise.

## 2020-02-25 DIAGNOSIS — I70213 Atherosclerosis of native arteries of extremities with intermittent claudication, bilateral legs: Secondary | ICD-10-CM | POA: Diagnosis not present

## 2020-02-25 DIAGNOSIS — T82856A Stenosis of peripheral vascular stent, initial encounter: Secondary | ICD-10-CM | POA: Diagnosis not present

## 2020-03-02 DIAGNOSIS — G894 Chronic pain syndrome: Secondary | ICD-10-CM | POA: Diagnosis not present

## 2020-03-02 DIAGNOSIS — G8929 Other chronic pain: Secondary | ICD-10-CM | POA: Diagnosis not present

## 2020-03-02 DIAGNOSIS — M25562 Pain in left knee: Secondary | ICD-10-CM | POA: Diagnosis not present

## 2020-03-02 DIAGNOSIS — M6281 Muscle weakness (generalized): Secondary | ICD-10-CM | POA: Diagnosis not present

## 2020-03-02 DIAGNOSIS — M542 Cervicalgia: Secondary | ICD-10-CM | POA: Diagnosis not present

## 2020-03-02 DIAGNOSIS — M25522 Pain in left elbow: Secondary | ICD-10-CM | POA: Diagnosis not present

## 2020-03-02 DIAGNOSIS — G8921 Chronic pain due to trauma: Secondary | ICD-10-CM | POA: Diagnosis not present

## 2020-03-02 DIAGNOSIS — R2689 Other abnormalities of gait and mobility: Secondary | ICD-10-CM | POA: Diagnosis not present

## 2020-03-02 DIAGNOSIS — M25512 Pain in left shoulder: Secondary | ICD-10-CM | POA: Diagnosis not present

## 2020-03-02 DIAGNOSIS — M545 Low back pain: Secondary | ICD-10-CM | POA: Diagnosis not present

## 2020-03-04 DIAGNOSIS — G8929 Other chronic pain: Secondary | ICD-10-CM | POA: Diagnosis not present

## 2020-03-04 DIAGNOSIS — M25562 Pain in left knee: Secondary | ICD-10-CM | POA: Diagnosis not present

## 2020-03-04 DIAGNOSIS — M545 Low back pain: Secondary | ICD-10-CM | POA: Diagnosis not present

## 2020-03-04 DIAGNOSIS — M25522 Pain in left elbow: Secondary | ICD-10-CM | POA: Diagnosis not present

## 2020-03-04 DIAGNOSIS — M542 Cervicalgia: Secondary | ICD-10-CM | POA: Diagnosis not present

## 2020-03-04 DIAGNOSIS — M25512 Pain in left shoulder: Secondary | ICD-10-CM | POA: Diagnosis not present

## 2020-03-04 DIAGNOSIS — G8921 Chronic pain due to trauma: Secondary | ICD-10-CM | POA: Diagnosis not present

## 2020-03-04 DIAGNOSIS — G894 Chronic pain syndrome: Secondary | ICD-10-CM | POA: Diagnosis not present

## 2020-03-04 DIAGNOSIS — R2689 Other abnormalities of gait and mobility: Secondary | ICD-10-CM | POA: Diagnosis not present

## 2020-03-04 DIAGNOSIS — M6281 Muscle weakness (generalized): Secondary | ICD-10-CM | POA: Diagnosis not present

## 2020-03-09 DIAGNOSIS — M542 Cervicalgia: Secondary | ICD-10-CM | POA: Diagnosis not present

## 2020-03-09 DIAGNOSIS — G8929 Other chronic pain: Secondary | ICD-10-CM | POA: Diagnosis not present

## 2020-03-09 DIAGNOSIS — M25512 Pain in left shoulder: Secondary | ICD-10-CM | POA: Diagnosis not present

## 2020-03-09 DIAGNOSIS — I70213 Atherosclerosis of native arteries of extremities with intermittent claudication, bilateral legs: Secondary | ICD-10-CM | POA: Diagnosis not present

## 2020-03-09 DIAGNOSIS — R2689 Other abnormalities of gait and mobility: Secondary | ICD-10-CM | POA: Diagnosis not present

## 2020-03-09 DIAGNOSIS — M25522 Pain in left elbow: Secondary | ICD-10-CM | POA: Diagnosis not present

## 2020-03-09 DIAGNOSIS — G894 Chronic pain syndrome: Secondary | ICD-10-CM | POA: Diagnosis not present

## 2020-03-09 DIAGNOSIS — G8921 Chronic pain due to trauma: Secondary | ICD-10-CM | POA: Diagnosis not present

## 2020-03-09 DIAGNOSIS — M6281 Muscle weakness (generalized): Secondary | ICD-10-CM | POA: Diagnosis not present

## 2020-03-09 DIAGNOSIS — M545 Low back pain: Secondary | ICD-10-CM | POA: Diagnosis not present

## 2020-03-09 DIAGNOSIS — M25562 Pain in left knee: Secondary | ICD-10-CM | POA: Diagnosis not present

## 2020-03-10 ENCOUNTER — Telehealth: Payer: Self-pay

## 2020-03-10 MED ORDER — DICLOFENAC SODIUM 1 % EX GEL: 2.0000 g | Freq: Four times a day (QID) | CUTANEOUS | 5 refills | Status: DC | PRN

## 2020-03-10 MED ORDER — HYDROCODONE-ACETAMINOPHEN 5-325 MG PO TABS: 1.0000 | ORAL_TABLET | Freq: Four times a day (QID) | ORAL | 0 refills | Status: DC | PRN

## 2020-03-10 NOTE — Telephone Encounter (Signed)
Patient called in top get a medication refill for HYDROcodone-acetaminophen (NORCO/VICODIN) 5-325 MG tablet [id number]   diclofenac sodium (VOLTAREN) 1 % GEL [id number]    Please send it to [facility], [place], [place] [address]  Phone:  [phone] Fax:  [phone]  DEA #:  [id number]

## 2020-03-10 NOTE — Telephone Encounter (Signed)
Hydrocodone refill.   Last OV: 12/24/19 Last Fill: 01/29/20 #120 and 0RF Pt sig: 1 tab q6h prn UDS: 07/11/2019 Low risk

## 2020-03-10 NOTE — Telephone Encounter (Signed)
PDMP okay, RF sent 

## 2020-03-11 DIAGNOSIS — G894 Chronic pain syndrome: Secondary | ICD-10-CM | POA: Diagnosis not present

## 2020-03-11 DIAGNOSIS — M25562 Pain in left knee: Secondary | ICD-10-CM | POA: Diagnosis not present

## 2020-03-11 DIAGNOSIS — R2689 Other abnormalities of gait and mobility: Secondary | ICD-10-CM | POA: Diagnosis not present

## 2020-03-11 DIAGNOSIS — G8921 Chronic pain due to trauma: Secondary | ICD-10-CM | POA: Diagnosis not present

## 2020-03-11 DIAGNOSIS — M6281 Muscle weakness (generalized): Secondary | ICD-10-CM | POA: Diagnosis not present

## 2020-03-11 DIAGNOSIS — M25522 Pain in left elbow: Secondary | ICD-10-CM | POA: Diagnosis not present

## 2020-03-11 DIAGNOSIS — M542 Cervicalgia: Secondary | ICD-10-CM | POA: Diagnosis not present

## 2020-03-11 DIAGNOSIS — M545 Low back pain: Secondary | ICD-10-CM | POA: Diagnosis not present

## 2020-03-11 DIAGNOSIS — M25512 Pain in left shoulder: Secondary | ICD-10-CM | POA: Diagnosis not present

## 2020-03-11 DIAGNOSIS — G8929 Other chronic pain: Secondary | ICD-10-CM | POA: Diagnosis not present

## 2020-03-12 DIAGNOSIS — I70213 Atherosclerosis of native arteries of extremities with intermittent claudication, bilateral legs: Secondary | ICD-10-CM | POA: Diagnosis not present

## 2020-03-16 DIAGNOSIS — G8929 Other chronic pain: Secondary | ICD-10-CM | POA: Diagnosis not present

## 2020-03-16 DIAGNOSIS — M25562 Pain in left knee: Secondary | ICD-10-CM | POA: Diagnosis not present

## 2020-03-16 DIAGNOSIS — M542 Cervicalgia: Secondary | ICD-10-CM | POA: Diagnosis not present

## 2020-03-16 DIAGNOSIS — M25512 Pain in left shoulder: Secondary | ICD-10-CM | POA: Diagnosis not present

## 2020-03-16 DIAGNOSIS — G8921 Chronic pain due to trauma: Secondary | ICD-10-CM | POA: Diagnosis not present

## 2020-03-16 DIAGNOSIS — M545 Low back pain: Secondary | ICD-10-CM | POA: Diagnosis not present

## 2020-03-16 DIAGNOSIS — M6281 Muscle weakness (generalized): Secondary | ICD-10-CM | POA: Diagnosis not present

## 2020-03-16 DIAGNOSIS — G894 Chronic pain syndrome: Secondary | ICD-10-CM | POA: Diagnosis not present

## 2020-03-16 DIAGNOSIS — M25522 Pain in left elbow: Secondary | ICD-10-CM | POA: Diagnosis not present

## 2020-03-16 DIAGNOSIS — R2689 Other abnormalities of gait and mobility: Secondary | ICD-10-CM | POA: Diagnosis not present

## 2020-03-18 DIAGNOSIS — G894 Chronic pain syndrome: Secondary | ICD-10-CM | POA: Diagnosis not present

## 2020-03-18 DIAGNOSIS — R2689 Other abnormalities of gait and mobility: Secondary | ICD-10-CM | POA: Diagnosis not present

## 2020-03-18 DIAGNOSIS — M6281 Muscle weakness (generalized): Secondary | ICD-10-CM | POA: Diagnosis not present

## 2020-03-18 DIAGNOSIS — M545 Low back pain: Secondary | ICD-10-CM | POA: Diagnosis not present

## 2020-03-18 DIAGNOSIS — G8929 Other chronic pain: Secondary | ICD-10-CM | POA: Diagnosis not present

## 2020-03-18 DIAGNOSIS — G8921 Chronic pain due to trauma: Secondary | ICD-10-CM | POA: Diagnosis not present

## 2020-03-18 DIAGNOSIS — M25562 Pain in left knee: Secondary | ICD-10-CM | POA: Diagnosis not present

## 2020-03-18 DIAGNOSIS — M25512 Pain in left shoulder: Secondary | ICD-10-CM | POA: Diagnosis not present

## 2020-03-18 DIAGNOSIS — M542 Cervicalgia: Secondary | ICD-10-CM | POA: Diagnosis not present

## 2020-03-18 DIAGNOSIS — M25522 Pain in left elbow: Secondary | ICD-10-CM | POA: Diagnosis not present

## 2020-03-31 DIAGNOSIS — G894 Chronic pain syndrome: Secondary | ICD-10-CM | POA: Diagnosis not present

## 2020-03-31 DIAGNOSIS — S88111D Complete traumatic amputation at level between knee and ankle, right lower leg, subsequent encounter: Secondary | ICD-10-CM | POA: Diagnosis not present

## 2020-03-31 DIAGNOSIS — M6281 Muscle weakness (generalized): Secondary | ICD-10-CM | POA: Diagnosis not present

## 2020-03-31 DIAGNOSIS — R2689 Other abnormalities of gait and mobility: Secondary | ICD-10-CM | POA: Diagnosis not present

## 2020-04-01 ENCOUNTER — Other Ambulatory Visit: Payer: Self-pay | Admitting: Family Medicine

## 2020-04-01 DIAGNOSIS — G894 Chronic pain syndrome: Secondary | ICD-10-CM | POA: Diagnosis not present

## 2020-04-01 DIAGNOSIS — M6281 Muscle weakness (generalized): Secondary | ICD-10-CM | POA: Diagnosis not present

## 2020-04-01 DIAGNOSIS — R110 Nausea: Secondary | ICD-10-CM

## 2020-04-01 DIAGNOSIS — R2689 Other abnormalities of gait and mobility: Secondary | ICD-10-CM | POA: Diagnosis not present

## 2020-04-01 DIAGNOSIS — S88111D Complete traumatic amputation at level between knee and ankle, right lower leg, subsequent encounter: Secondary | ICD-10-CM | POA: Diagnosis not present

## 2020-04-01 MED ORDER — ONDANSETRON 4 MG PO TBDP: ORAL_TABLET | ORAL | 0 refills | Status: DC

## 2020-04-01 NOTE — Telephone Encounter (Signed)
Pt called stating she is needing a refill on her ondansetron (ZOFRAN-ODT) 4 MG disintegrating tablet sent in to the [facility] in [place], [place] states she only has 2 left. Please advise.

## 2020-04-05 ENCOUNTER — Telehealth: Payer: Self-pay | Admitting: Orthopedic Surgery

## 2020-04-05 ENCOUNTER — Other Ambulatory Visit: Payer: Self-pay

## 2020-04-05 DIAGNOSIS — M542 Cervicalgia: Secondary | ICD-10-CM

## 2020-04-05 NOTE — Telephone Encounter (Signed)
Yes make the referral to neurology.

## 2020-04-05 NOTE — Telephone Encounter (Signed)
Patient called requesting a referral to Dr. Cathren Laine office.  CB#[phone]

## 2020-04-05 NOTE — Telephone Encounter (Signed)
Patient was called and understood referral was sent.

## 2020-04-05 NOTE — Telephone Encounter (Signed)
Dr Sharol Given, please advise. Patient has been seen in our office for neck pain and would like to be referred to Dr Jaynee Eagles a Neurologist. Is this okay with you? Thank you

## 2020-04-06 DIAGNOSIS — S88111D Complete traumatic amputation at level between knee and ankle, right lower leg, subsequent encounter: Secondary | ICD-10-CM | POA: Diagnosis not present

## 2020-04-06 DIAGNOSIS — R2689 Other abnormalities of gait and mobility: Secondary | ICD-10-CM | POA: Diagnosis not present

## 2020-04-06 DIAGNOSIS — M6281 Muscle weakness (generalized): Secondary | ICD-10-CM | POA: Diagnosis not present

## 2020-04-06 DIAGNOSIS — G894 Chronic pain syndrome: Secondary | ICD-10-CM | POA: Diagnosis not present

## 2020-04-07 ENCOUNTER — Telehealth: Payer: Self-pay | Admitting: Internal Medicine

## 2020-04-07 NOTE — Telephone Encounter (Signed)
Hydrocodone refill.   Last OV: 12/24/19 Last Fill: 03/10/2020 #120 and 0RF Pt sig: 1 tab q6h prn UDS: 07/11/2019 Low risk

## 2020-04-07 NOTE — Telephone Encounter (Signed)
Medication: HYDROcodone-acetaminophen (NORCO/VICODIN) 5-325 MG tablet    Has the patient contacted their pharmacy? No. (If no, request that the patient contact the pharmacy for the refill.) (If yes, when and what did the pharmacy advise?)  Preferred Pharmacy (with phone number or street name): [facility], [place] [address] [place], [place] [address]  Phone:  [phone] Fax:  [phone]  DEA #:  [id number]   d that RX refills may take up to 3 business days. We ask that you follow-up with your pharmacy.

## 2020-04-08 MED ORDER — HYDROCODONE-ACETAMINOPHEN 5-325 MG PO TABS: 1.0000 | ORAL_TABLET | Freq: Four times a day (QID) | ORAL | 0 refills | Status: DC | PRN

## 2020-04-08 NOTE — Telephone Encounter (Signed)
PDMP okay, RF sent 

## 2020-04-13 DIAGNOSIS — M6281 Muscle weakness (generalized): Secondary | ICD-10-CM | POA: Diagnosis not present

## 2020-04-13 DIAGNOSIS — G894 Chronic pain syndrome: Secondary | ICD-10-CM | POA: Diagnosis not present

## 2020-04-13 DIAGNOSIS — R2689 Other abnormalities of gait and mobility: Secondary | ICD-10-CM | POA: Diagnosis not present

## 2020-04-13 DIAGNOSIS — S88111D Complete traumatic amputation at level between knee and ankle, right lower leg, subsequent encounter: Secondary | ICD-10-CM | POA: Diagnosis not present

## 2020-04-15 DIAGNOSIS — G894 Chronic pain syndrome: Secondary | ICD-10-CM | POA: Diagnosis not present

## 2020-04-15 DIAGNOSIS — R2689 Other abnormalities of gait and mobility: Secondary | ICD-10-CM | POA: Diagnosis not present

## 2020-04-15 DIAGNOSIS — S88111D Complete traumatic amputation at level between knee and ankle, right lower leg, subsequent encounter: Secondary | ICD-10-CM | POA: Diagnosis not present

## 2020-04-15 DIAGNOSIS — M6281 Muscle weakness (generalized): Secondary | ICD-10-CM | POA: Diagnosis not present

## 2020-05-06 ENCOUNTER — Telehealth: Payer: Self-pay | Admitting: Internal Medicine

## 2020-05-06 DIAGNOSIS — R2689 Other abnormalities of gait and mobility: Secondary | ICD-10-CM | POA: Diagnosis not present

## 2020-05-06 DIAGNOSIS — M6281 Muscle weakness (generalized): Secondary | ICD-10-CM | POA: Diagnosis not present

## 2020-05-06 DIAGNOSIS — G894 Chronic pain syndrome: Secondary | ICD-10-CM | POA: Diagnosis not present

## 2020-05-06 DIAGNOSIS — S88111D Complete traumatic amputation at level between knee and ankle, right lower leg, subsequent encounter: Secondary | ICD-10-CM | POA: Diagnosis not present

## 2020-05-06 MED ORDER — HYDROCODONE-ACETAMINOPHEN 5-325 MG PO TABS: 1.0000 | ORAL_TABLET | Freq: Four times a day (QID) | ORAL | 0 refills | Status: DC | PRN

## 2020-05-06 NOTE — Telephone Encounter (Signed)
Last hydrocodone RX:  04/08/20, # 120 x no refills Last OV: 12/24/19 Next OV: none scheduled UDS: 07/11/19 CSC: 07/20/17

## 2020-05-06 NOTE — Telephone Encounter (Signed)
Please call patient to schedule an office visit before the need for another refill. Prescription sent

## 2020-05-06 NOTE — Telephone Encounter (Signed)
Medication: HYDROcodone-acetaminophen (NORCO/VICODIN) 5-325 MG tablet [[id number]]    Has the patient contacted their pharmacy? No. (If no, request that the patient contact the pharmacy for the refill.) (If yes, when and what did the pharmacy advise?)  Preferred Pharmacy (with phone number or street name): [facility] [place], [place] [address] [place], [place] [address]  Phone:  [phone] Fax:  [phone]  DEA #:  [id number]  Agent: Please be advised that RX refills may take up to 3 business days. We ask that you follow-up with your pharmacy.

## 2020-05-07 DIAGNOSIS — T82856A Stenosis of peripheral vascular stent, initial encounter: Secondary | ICD-10-CM | POA: Diagnosis not present

## 2020-05-07 DIAGNOSIS — I70213 Atherosclerosis of native arteries of extremities with intermittent claudication, bilateral legs: Secondary | ICD-10-CM | POA: Diagnosis not present

## 2020-05-07 NOTE — Telephone Encounter (Signed)
Left voice mail for patient to call and schedule appointment.

## 2020-05-12 ENCOUNTER — Telehealth: Payer: Self-pay | Admitting: Physician Assistant

## 2020-05-12 MED ORDER — PREDNISONE 10 MG PO TABS: 10.0000 mg | ORAL_TABLET | Freq: Every day | ORAL | 1 refills | Status: DC

## 2020-05-12 NOTE — Telephone Encounter (Signed)
Please advise, thank you.

## 2020-05-12 NOTE — Telephone Encounter (Signed)
Patient called requesting medication refill of prednisone. [facility] in [facility]. Pharmacy on file. Patient son phone number is [phone]

## 2020-05-13 DIAGNOSIS — R2689 Other abnormalities of gait and mobility: Secondary | ICD-10-CM | POA: Diagnosis not present

## 2020-05-13 DIAGNOSIS — G894 Chronic pain syndrome: Secondary | ICD-10-CM | POA: Diagnosis not present

## 2020-05-13 DIAGNOSIS — S88111D Complete traumatic amputation at level between knee and ankle, right lower leg, subsequent encounter: Secondary | ICD-10-CM | POA: Diagnosis not present

## 2020-05-13 DIAGNOSIS — M6281 Muscle weakness (generalized): Secondary | ICD-10-CM | POA: Diagnosis not present

## 2020-05-18 DIAGNOSIS — R296 Repeated falls: Secondary | ICD-10-CM | POA: Diagnosis not present

## 2020-05-20 DIAGNOSIS — S88111D Complete traumatic amputation at level between knee and ankle, right lower leg, subsequent encounter: Secondary | ICD-10-CM | POA: Diagnosis not present

## 2020-05-20 DIAGNOSIS — R2689 Other abnormalities of gait and mobility: Secondary | ICD-10-CM | POA: Diagnosis not present

## 2020-05-20 DIAGNOSIS — G894 Chronic pain syndrome: Secondary | ICD-10-CM | POA: Diagnosis not present

## 2020-05-20 DIAGNOSIS — M6281 Muscle weakness (generalized): Secondary | ICD-10-CM | POA: Diagnosis not present

## 2020-06-07 ENCOUNTER — Telehealth: Payer: Self-pay | Admitting: Internal Medicine

## 2020-06-07 MED ORDER — HYDROCODONE-ACETAMINOPHEN 5-325 MG PO TABS: 1.0000 | ORAL_TABLET | Freq: Four times a day (QID) | ORAL | 0 refills | Status: DC | PRN

## 2020-06-07 NOTE — Telephone Encounter (Signed)
  Patient states that she fell, and needs a few pills until next appointment.    Medication: HYDROcodone-acetaminophen (NORCO/VICODIN) 5-325 MG tablet    Has the patient contacted their pharmacy? No. (If no, request that the patient contact the pharmacy for the refill.) (If yes, when and what did the pharmacy advise?)  Preferred Pharmacy (with phone number or street name):    [facility] [place], [place] [place], [place] [address]  Phone:  [phone] Fax:  [phone]  DEA #:  [id number]    Agent: Please be advised that RX refills may take up to 3 business days. We ask that you follow-up with your pharmacy.

## 2020-06-07 NOTE — Telephone Encounter (Signed)
Please advise 

## 2020-06-07 NOTE — Telephone Encounter (Signed)
PDMP okay, prescription sent, has an appointment with me in few days.

## 2020-06-08 DIAGNOSIS — I10 Essential (primary) hypertension: Secondary | ICD-10-CM | POA: Diagnosis not present

## 2020-06-08 DIAGNOSIS — E782 Mixed hyperlipidemia: Secondary | ICD-10-CM | POA: Diagnosis not present

## 2020-06-08 DIAGNOSIS — R2689 Other abnormalities of gait and mobility: Secondary | ICD-10-CM | POA: Diagnosis not present

## 2020-06-08 DIAGNOSIS — S88111D Complete traumatic amputation at level between knee and ankle, right lower leg, subsequent encounter: Secondary | ICD-10-CM | POA: Diagnosis not present

## 2020-06-08 DIAGNOSIS — G894 Chronic pain syndrome: Secondary | ICD-10-CM | POA: Diagnosis not present

## 2020-06-08 DIAGNOSIS — M6281 Muscle weakness (generalized): Secondary | ICD-10-CM | POA: Diagnosis not present

## 2020-06-08 DIAGNOSIS — E119 Type 2 diabetes mellitus without complications: Secondary | ICD-10-CM | POA: Diagnosis not present

## 2020-06-10 ENCOUNTER — Encounter: Payer: Self-pay | Admitting: Neurology

## 2020-06-10 ENCOUNTER — Ambulatory Visit (INDEPENDENT_AMBULATORY_CARE_PROVIDER_SITE_OTHER): Payer: Medicare Other

## 2020-06-10 ENCOUNTER — Telehealth: Payer: Self-pay | Admitting: Orthopedic Surgery

## 2020-06-10 ENCOUNTER — Ambulatory Visit (INDEPENDENT_AMBULATORY_CARE_PROVIDER_SITE_OTHER): Payer: Medicare Other | Admitting: Orthopedic Surgery

## 2020-06-10 ENCOUNTER — Ambulatory Visit (INDEPENDENT_AMBULATORY_CARE_PROVIDER_SITE_OTHER): Payer: Medicare Other | Admitting: Neurology

## 2020-06-10 ENCOUNTER — Encounter: Payer: Self-pay | Admitting: Physician Assistant

## 2020-06-10 VITALS — BP 109/65 | HR 88 | Ht 64.0 in | Wt 170.0 lb

## 2020-06-10 VITALS — Ht 64.0 in | Wt 180.0 lb

## 2020-06-10 DIAGNOSIS — W19XXXA Unspecified fall, initial encounter: Secondary | ICD-10-CM

## 2020-06-10 DIAGNOSIS — I2510 Atherosclerotic heart disease of native coronary artery without angina pectoris: Secondary | ICD-10-CM | POA: Diagnosis not present

## 2020-06-10 DIAGNOSIS — G8928 Other chronic postprocedural pain: Secondary | ICD-10-CM

## 2020-06-10 DIAGNOSIS — G43711 Chronic migraine without aura, intractable, with status migrainosus: Secondary | ICD-10-CM

## 2020-06-10 DIAGNOSIS — M542 Cervicalgia: Secondary | ICD-10-CM

## 2020-06-10 DIAGNOSIS — G8929 Other chronic pain: Secondary | ICD-10-CM | POA: Diagnosis not present

## 2020-06-10 DIAGNOSIS — M5441 Lumbago with sciatica, right side: Secondary | ICD-10-CM

## 2020-06-10 DIAGNOSIS — M5410 Radiculopathy, site unspecified: Secondary | ICD-10-CM | POA: Diagnosis not present

## 2020-06-10 DIAGNOSIS — R269 Unspecified abnormalities of gait and mobility: Secondary | ICD-10-CM | POA: Diagnosis not present

## 2020-06-10 DIAGNOSIS — W19XXXD Unspecified fall, subsequent encounter: Secondary | ICD-10-CM

## 2020-06-10 MED ORDER — PREDNISONE 10 MG PO TABS: 20.0000 mg | ORAL_TABLET | Freq: Every day | ORAL | 0 refills | Status: DC

## 2020-06-10 NOTE — Progress Notes (Addendum)
Office Visit Note   Patient: [name]           Date of Birth: [birth year]           MRN: [id number] Visit Date: 06/10/2020              Requested by: Colon Branch, [address] [place],  [place] [address] PCP: Colon Branch, MD  Chief Complaint  Patient presents with  . Lower Back - Pain    S/p fall 2 weeks ago.       HPI: Patient is an 85 year old woman who presents with left lower back pain radiating into the buttocks down to the left thigh.  Patient states she has had multiple falls lately most recently fell injured her shoulder and she is in a sling.  Patient denies any focal weakness other than her difficulty with falling.  Patient states that her prosthetic liners are sliding down due to the breakdown of the liner.  Assessment & Plan: Visit Diagnoses:  1. Acute right-sided low back pain with right-sided sciatica   2. Back pain with left-sided radiculopathy     Plan: Patient states she does have a follow-up with her vascular surgeon and I discussed that they would be the ones to follow-up on her aortic aneurysm.  Will start her on prednisone 20 mg with breakfast if this does not improve her symptoms in a week we would set up an MRI scan for possibility of epidural steroid injections.  Discussed that with the liner breakdown patient is at risk of skin breakdowns with ulcer or calluses.  Patient will need new prosthetic liners and sleeves with associated materials and supplies.  Follow-Up Instructions: Return in about 4 weeks (around 07/08/2020).   Ortho Exam  Patient is alert, oriented, no adenopathy, well-dressed, normal affect, normal respiratory effort. Examination patient is ambulating in a wheelchair.  She has no focal motor weakness in the left lower extremity she has a right transtibial amputation.  Patient has a negative straight leg raise on the left.  Patient states she has stents placed in the left lower extremity by her vascular  surgeon.    Imaging: XR Lumbar Spine 2-3 Views  Result Date: 06/10/2020 2 view radiographs of the lumbar spine shows advanced disc space collapse at L4-5 and L2-3 with a compression fracture chronic at L1.  Patient has a vena cava filter and has a aortic aneurysm 4 cm in diameter.  No images are attached to the encounter.  Labs: Lab Results  Component Value Date   HGBA1C 6.3 07/11/2019   HGBA1C 6.2 02/28/2019   HGBA1C 6.5 05/20/2018   ESRSEDRATE 9 06/12/2014   ESRSEDRATE 12 05/13/2014   REPTSTATUS 12/27/2019 FINAL 12/24/2019   CULT (A) 12/24/2019    <10,000 COLONIES/mL INSIGNIFICANT GROWTH Performed at [facility], [place] [address], [place], [place] [address]    [place] 09/18/2016     Lab Results  Component Value Date   ALBUMIN 4.2 12/24/2019   ALBUMIN 4.2 07/11/2019   ALBUMIN 4.3 02/28/2019    Lab Results  Component Value Date   MG 1.5 03/30/2016   Lab Results  Component Value Date   VD25OH 65 10/20/2011    No results found for: PREALBUMIN CBC EXTENDED Latest Ref Rng & Units 12/24/2019 08/22/2019 02/28/2019  WBC 4.0 - 10.5 K/uL 5.0 3.7(L) 4.0  RBC 3.87 - 5.11 MIL/uL 4.22 4.07 4.06  HGB 12.0 - 15.0 g/dL 12.4 12.5  12.7  HCT 36 - 46 % 40.0 37.8 38.3  PLT 150 - 400 K/uL 160 155.0 148.0(L)  NEUTROABS 1.7 - 7.7 K/uL 3.2 2.0 2.3  LYMPHSABS 0.7 - 4.0 K/uL 1.2 1.3 1.4     Body mass index is 30.9 kg/m.  Orders:  Orders Placed This Encounter  Procedures  . XR Lumbar Spine 2-3 Views   No orders of the defined types were placed in this encounter.    Procedures: No procedures performed  Clinical Data: No additional findings.  ROS:  All other systems negative, except as noted in the HPI. Review of Systems  Objective: Vital Signs: Ht _0  (1.626 m)   Wt 180 lb (81.6 kg)   BMI 30.90 kg/m   Specialty Comments:  No specialty comments available.  PMFS History: Patient Active Problem List   Diagnosis Date Noted  . External  hemorrhoids 02/15/2018  . History of three vessel coronary artery bypass - 2017 , in [facility] 09/25/2017  . PVD (peripheral vascular disease) ([place])-- stent L leg 06-2017 Dr Feliberto [facility] 07/23/2017  . Phantom pain ([place]) 04/01/2017  . Constipation, chronic 04/01/2017  . S/P unilateral BKA (below knee amputation), right ([place]) 12/14/2016  . Toe osteomyelitis, left ([place]) 11/17/2016  . Acute on chronic diastolic CHF (congestive heart failure), NYHA class 3 ([place]) 03/30/2016  . Chest pain 03/30/2016  . Coronary artery disease due to lipid rich plaque 03/30/2016  . Statin intolerance 03/30/2016  . Acute renal failure superimposed on stage 3 chronic kidney disease ([place]) 03/30/2016  . S/P below knee amputation ([place]) 02/18/2016  . Head trauma 11/26/2015  . Memory loss 11/26/2015  . PCP NOTES >>>>>>>>>>>>>>>>>>>> 10/18/2015  . S/P partial colectomy 01/08/2015  . 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 Anemia 01/18/2014  . Rectal bleeding 10/31/2013  . CAD (coronary artery disease)   . Carcinoma in situ in a polyp   . Depression 09/17/2013  . Charcot's joint 08/21/2013  . Foot osteomyelitis, right ([place]) 07/16/2013  . Chronic pain associated with significant psychosocial dysfunction 05/06/2013  . Neuropathy   . Hyperthyroidism   . Lower extremity edema 03/08/2011  . Annual physical exam 03/08/2011  . ALLERGIC RHINITIS 04/15/2010  . ORTHOSTATIC DIZZINESS 04/13/2010  . HIATAL HERNIA 12/06/2009  . GASTRIC ULCER, HX OF 12/06/2009  . DYSPNEA 03/18/2009  . DEGENERATIVE JOINT DISEASE 06/03/2008  . COLONIC POLYPS 01/27/2008  . DIVERTICULOSIS, COLON 01/27/2008  . IRRITABLE BOWEL SYNDROME, HX OF 01/27/2008  . Pulmonary nodule, right 01/22/2008  . Hyperlipidemia 12/30/2007  . Osteoporosis 12/30/2007  . TB SKIN TEST, POSITIVE 08/09/2007  . DM type 2 with diabetic peripheral neuropathy ([place]) 05/09/2007  . Reflex sympathetic dystrophy-- PAIN MNGMT  05/09/2007  . Essential  hypertension 04/15/2007  . GERD 04/15/2007   Past Medical History:  Diagnosis Date  . Abnormal echocardiogram    09/2013: mild LVH, mild focal basal hypertrophy of septum, EF 60-65%, normal WM, grade 1 diastolic dysfunction, MAC, mild LAE, ASA, PASP 34. Possible oscillating MV density - reviewed by MD and felt there was no significant abnormality other than MAC noted with mitral valve and did not require TEE.  . Adenomatous colon polyp   . Allergic rhinitis   . Arthritis    "back; ankles; hands; knees" (10/31/2013)  . Bilateral sensorineural hearing loss   . CAD (coronary artery disease)    a. Nonobst in 2011. b. Abnormal nuc 09/2013 -> s/p cutting balloon to D2, mild LAD disease; c. 08/2015 MV: no ischemia, EF  71%.  . Cancer ([place])    cancerous polyps  . Carcinoma in situ in a polyp 1994   a. 1994 - malignant polyp removed during colonoscopy.  . Charcot's Foot    a.  01/2016 s/p RLE transtibial amputation 2/2 Charcot rocker-bottom deformity and insensate neuropathy ulceration.  . Chronic diastolic CHF (congestive heart failure) ([place])    a. 09/2013 Echo: EF 60-65%, mild LVH, Gr1 DD.  Marland Kitchen Diverticulosis   . DJD (degenerative joint disease)   . DVT (deep venous thrombosis) ([place]) 12/2007  . Dyspnea    a. Chronic, extensive w/u see OV note 09-2010. b. [place] 2011: ormal with RA 5 RV 31/2 PA 27/12 (19) PCW 10 CO normal. No evidence of shunting with sitting up in cath lab. c. CPX 2011: see report. d. Prior fluoro of diaphragm -  R diaphragm elevated at rest but both moved with inspiration.   . Fatty liver   . GERD (gastroesophageal reflux disease)    a. Hx GERD/esophageal dysmotility followed by Dr. Olevia Perches.   Marland Kitchen Head injury due to trauma   . Headache    Optic migraine  . Hemorrhoids   . Hiatal hernia    a. s/p Nissen fundoplication 3299.  Marland Kitchen Hyperlipidemia    a. patient unwilling to use statins.  . Hypertensive heart disease   . IBS (irritable bowel syndrome)   . Macular degeneration 03/2009    Dr. Rosana Hoes  . Myocardial infarction ([place]) 2017  . Neuropathy    a. Hands, feet, legs.  . Orthostatic hypotension   . Osteomyelitis ([place])    a. 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 01/2016 s/p RLE transtibial amputation 2/2 Charcot rocker-bottom deformity and insensate neuropathy ulceration.  . Osteoporosis   . PE (pulmonary embolism) 12/2007   a. PE/DVT after neck surgery 2009. b. coumadin d/c 10-2008.  Marland Kitchen Pneumonia 2015ish  . PONV (postoperative nausea and vomiting) 2009   neck surgery  . PPD positive   . Pulmonary nodule    incidental per CT:  Pet scan 4-9: likely benign, CT 08-2009 no change, no further CTs (Dr. Gwenette Greet)  . Recurrent UTI   . RSD (reflex sympathetic dystrophy)    a. Chronic pain.  Marland Kitchen Spinal stenosis   . Type II diabetes mellitus (HCC)    no on medication  . Venous insufficiency    a. Contributing to LEE.    Family History  Problem Relation Age of Onset  . Heart disease Mother        mitral valve replaced  . Arthritis Mother   . Diabetic kidney disease Daughter   . Diabetes Father   . Stroke Father   . Migraines Sister   . Hypertension Other   . Breast cancer Other   . Colon cancer Neg Hx   . Esophageal cancer Neg Hx   . Stomach cancer Neg Hx   . Rectal cancer Neg Hx     Past Surgical History:  Procedure Laterality Date  . AMPUTATION  03/06/2012   Procedure: AMPUTATION FOOT;  Surgeon: Newt Minion, MD;  Location: [facility];  Service: Orthopedics;  Laterality: Left;  FIFTH RAY AMPUTATION   . AMPUTATION Right 02/18/2016   Procedure: AMPUTATION BELOW KNEE;  Surgeon: Newt Minion, MD;  Location: [facility];  Service: Orthopedics;  Laterality: Right;  . AMPUTATION Left 11/22/2016   Procedure: Amputation 4th Toe Left Foot at Metatarsophalangeal Joint;  Surgeon: Newt Minion, MD;  Location[facility]  OR;  Service: Orthopedics;  Laterality: Left;  . ANKLE FUSION  09/27/2012   Procedure: ANKLE FUSION;  Surgeon: Newt Minion, MD;  Location: [facility];  Service: Orthopedics;  Laterality: Left;  Left Tibiocalcaneal Fusion  . ANKLE FUSION Right 05/09/2013   Procedure: ANKLE FUSION;  Surgeon: Newt Minion, MD;  Location: [facility];  Service: Orthopedics;  Laterality: Right;  Excision Osteomyelitis Base 1st MT Right Foot, Fusion Medial Column  . ANTERIOR CERVICAL DECOMP/DISCECTOMY FUSION  12/18/07   For OA,  Dr. Lorin Mercy:  fu by a PE  . CARDIAC CATHETERIZATION  05/2010    at [facility]  . CARPAL TUNNEL RELEASE Left 09/2011  . CATARACT EXTRACTION W/ INTRAOCULAR LENS  IMPLANT, BILATERAL  2004   feb 2004 left, aug 2004 right  . CHOLECYSTECTOMY  03/2002  . CORONARY ANGIOPLASTY  10/31/2013  . CORONARY ARTERY BYPASS GRAFT  09/27/2016  . CYST REMOVAL HAND  06/2003  . FOOT BONE EXCISION Right 06/2009  . LAPAROSCOPIC RIGHT HEMI COLECTOMY N/A 01/08/2015   Procedure: LAPAROSCOPIC ASSISTED RIGHT HEMI COLECTOMY;  Surgeon: Johnathan Hausen, MD;  Location: [facility];  Service: General;  Laterality: N/A;  . LEFT HEART CATHETERIZATION WITH CORONARY ANGIOGRAM N/A 10/31/2013   Procedure: LEFT HEART CATHETERIZATION WITH CORONARY ANGIOGRAM;  Surgeon: Jettie Booze, MD;  Location: [facility] CATH LAB;  Service: Cardiovascular;  Laterality: N/A;  . NASAL SEPTUM SURGERY  09/1963  . NISSEN FUNDOPLICATION  03/31/6294  . PERCUTANEOUS CORONARY INTERVENTION-BALLOON ONLY  10/31/2013   Procedure: PERCUTANEOUS CORONARY INTERVENTION-BALLOON ONLY;  Surgeon: Jettie Booze, MD;  Location: [facility] CATH LAB;  Service: Cardiovascular;;  . ROTATOR CUFF REPAIR Right 07/2007   Dr. Percell Miller  . ROTATOR CUFF REPAIR Left 11/2004  . stent in left leg, behind knee  06/27/2017   x2, done at [facility] cardiology in [facility]  . TOE AMPUTATION Right 08/2008   3rd toe, Dr. Sharol Given due to osteomyelitis  . VAGINAL HYSTERECTOMY  03/1975  . VEIN LIGATION Bilateral 03/1966  . VENA CAVA FILTER PLACEMENT  01/2010   green filter; "due to blood clots"  . WISDOM TOOTH EXTRACTION  06/2007   Social  History   Occupational History  . Occupation: Retired     Comment: from Orthoptist   Tobacco Use  . Smoking status: Never Smoker  . Smokeless tobacco: Never Used  . Tobacco comment: tried for a few months in college.   Vaping Use  . Vaping Use: Never used  Substance and Sexual Activity  . Alcohol use: No    Alcohol/week: 0.0 standard drinks  . Drug use: No  . Sexual activity: Not Currently    Birth control/protection: Post-menopausal

## 2020-06-10 NOTE — Patient Instructions (Signed)
MRI brain and cervical spine Blood work   Location manager in the Home, Adult Falls can cause injuries. They can happen to people of all ages. There are many things you can do to make your home safe and to help prevent falls. Ask for help when making these changes, if needed. What actions can I take to prevent falls? General Instructions  Use good lighting in all rooms. Replace any light bulbs that burn out.  Turn on the lights when you go into a dark area. Use night-lights.  Keep items that you use often in easy-to-reach places. Lower the shelves around your home if necessary.  Set up your furniture so you have a clear path. Avoid moving your furniture around.  Do not have throw rugs and other things on the floor that can make you trip.  Avoid walking on wet floors.  If any of your floors are uneven, fix them.  Add color or contrast paint or tape to clearly mark and help you see: ? Any grab bars or handrails. ? First and last steps of stairways. ? Where the edge of each step is.  If you use a stepladder: ? Make sure that it is fully opened. Do not climb a closed stepladder. ? Make sure that both sides of the stepladder are locked into place. ? Ask someone to hold the stepladder for you while you use it.  If there are any pets around you, be aware of where they are. What can I do in the bathroom?      Keep the floor dry. Clean up any water that spills onto the floor as soon as it happens.  Remove soap buildup in the tub or shower regularly.  Use non-skid mats or decals on the floor of the tub or shower.  Attach bath mats securely with double-sided, non-slip rug tape.  If you need to sit down in the shower, use a plastic, non-slip stool.  Install grab bars by the toilet and in the tub and shower. Do not use towel bars as grab bars. What can I do in the bedroom?  Make sure that you have a light by your bed that is easy to reach.  Do not use any sheets or blankets  that are too big for your bed. They should not hang down onto the floor.  Have a firm chair that has side arms. You can use this for support while you get dressed. What can I do in the kitchen?  Clean up any spills right away.  If you need to reach something above you, use a strong step stool that has a grab bar.  Keep electrical cords out of the way.  Do not use floor polish or wax that makes floors slippery. If you must use wax, use non-skid floor wax. What can I do with my stairs?  Do not leave any items on the stairs.  Make sure that you have a light switch at the top of the stairs and the bottom of the stairs. If you do not have them, ask someone to add them for you.  Make sure that there are handrails on both sides of the stairs, and use them. Fix handrails that are broken or loose. Make sure that handrails are as long as the stairways.  Install non-slip stair treads on all stairs in your home.  Avoid having throw rugs at the top or bottom of the stairs. If you do have throw rugs, attach them to the floor  with carpet tape.  Choose a carpet that does not hide the edge of the steps on the stairway.  Check any carpeting to make sure that it is firmly attached to the stairs. Fix any carpet that is loose or worn. What can I do on the outside of my home?  Use bright outdoor lighting.  Regularly fix the edges of walkways and driveways and fix any cracks.  Remove anything that might make you trip as you walk through a door, such as a raised step or threshold.  Trim any bushes or trees on the path to your home.  Regularly check to see if handrails are loose or broken. Make sure that both sides of any steps have handrails.  Install guardrails along the edges of any raised decks and porches.  Clear walking paths of anything that might make someone trip, such as tools or rocks.  Have any leaves, snow, or ice cleared regularly.  Use sand or salt on walking paths during  winter.  Clean up any spills in your garage right away. This includes grease or oil spills. What other actions can I take?  Wear shoes that: ? Have a low heel. Do not wear high heels. ? Have rubber bottoms. ? Are comfortable and fit you well. ? Are closed at the toe. Do not wear open-toe sandals.  Use tools that help you move around (mobility aids) if they are needed. These include: ? Canes. ? Walkers. ? Scooters. ? Crutches.  Review your medicines with your doctor. Some medicines can make you feel dizzy. This can increase your chance of falling. Ask your doctor what other things you can do to help prevent falls. Where to find more information  Centers for Disease Control and Prevention, STEADI: https://garcia.biz/  [employer] on Aging: BrainJudge.co.uk Contact a doctor if:  You are afraid of falling at home.  You feel weak, drowsy, or dizzy at home.  You fall at home. Summary  There are many simple things that you can do to make your home safe and to help prevent falls.  Ways to make your home safe include removing tripping hazards and installing grab bars in the bathroom.  Ask for help when making these changes in your home. This information is not intended to replace advice given to you by your health care provider. Make sure you discuss any questions you have with your health care provider. Document Revised: 01/30/2019 Document Reviewed: 05/24/2017 Elsevier Patient Education  2020 [employer].

## 2020-06-10 NOTE — Telephone Encounter (Signed)
Last 2 ov notes faxed to [facility] [phone]

## 2020-06-10 NOTE — Progress Notes (Signed)
[facility]  Provider:  Dr Jaynee Eagles Requesting Provider: Newt Minion, MD Primary Care Provider:  Colon Branch, MD  CC:  Multiple chronic pain conditions  HPI:  [name] is a 85 y.o. female here as requested by Newt Minion, MD for neck pain.  Patient is well-known to [place] [facility] and we have seen her for multiple conditions.  She has a very complicated and extensive history including a past medical history of chronic migraines, spinal stenosis, reflex sympathetic dystrophy, osteoporosis, orthostatic hypotension, polyneuropathy, hypertension, hyperlipidemia, falls, head injury due to trauma, DVTs, degenerative joint disease, chronic diastolic congestive heart failure, coronary artery disease, hearing loss, arthritis, chronic pain, peripheral vascular disease, diabetes, depression, lower extremity edema, partial colectomy, memory loss, below the knee amputation.   Patient today is here with her son who also provides much information.  Patient has a long history of multiple chronic pain disorders of which she we have evaluated and treated her for chronic migraines migraines, chronic dizziness and vertigo, headaches, chronic neck pain, multiple falls, ocular migraines.  Today she is here stating she still having headaches and migraines, she has chronic neck and back pain, she has tried heat, massage, dry needling in her shoulders in the past, she also reports that she still having falls however talking to her son it appears that she bent over to pick up a dog bed which she really should not have been doing giving her multiple risk factors for falls, we have tried multiple medications for her migraines, and patient currently has multiple medications on her list polypharmacy.  I had a long discussion with her and her son today, at this time I do not feel as though we should put another oral medication on her list we can try Emgality for her and I did inject her in the office today for  her migraines.  Patient complaining of falls again at this time I told patient she has multiple risk factors for falls and she cannot do things like bend over and pick up dog beds that puts her more at risk of falls, there is nothing that we can do at this point to mitigate her fall risk except have her not do things that we will inevitably lead to her losing her balance.  Review of Systems: Patient complains of symptoms per HPI as well as the following symptoms: migraines, dizziness, neck pain, back pain, falls. Pertinent negatives and positives per HPI. All others negative.   Social History   Socioeconomic History  . Marital status: Divorced    Spouse name: Not on file  . Number of children: 3  . Years of education: 7  . Highest education level: Not on file  Occupational History  . Occupation: Retired     Comment: from Orthopt[employer]   Tobacco Use  . Smoking status: Never Smoker  . Smokeless tobacco: Never Used  . Tobacco comment: tried for a few months in college.   Vaping Use  . Vaping Use: Never used  Substance and Sexual Activity  . Alcohol use: No    Alcohol/week: 0.0 standard drinks  . Drug use: No  . Sexual activity: Not Currently    Birth control/protection: Post-menopausal  Other Topics Concern  . Not on file  Social History Narrative   Daughter lives w/ her and another daughter lives in town   1 son in [place]   Son comes home every 4 weeks to visit   Caffeine use:  Tea 1/day  w/ dinner   Coffee occass          Currently lives in [place] with son [name].   Social Determinants of Health   Financial Resource Strain: Low Risk   . Difficulty of Paying Living Expenses: Not hard at all  Food Insecurity: No Food Insecurity  . Worried About Charity fundraiser in the Last Year: Never true  . Ran Out of Food in the Last Year: Never true  Transportation Needs: No Transportation Needs  . Lack of Transportation (Medical): No  . Lack of Transportation (Non-Medical):  No  Physical Activity:   . Days of Exercise per Week: Not on file  . Minutes of Exercise per Session: Not on file  Stress:   . Feeling of Stress : Not on file  Social Connections:   . Frequency of Communication with Friends and Family: Not on file  . Frequency of Social Gatherings with Friends and Family: Not on file  . Attends Religious Services: Not on file  . Active Member of Clubs or Organizations: Not on file  . Attends Archivist Meetings: Not on file  . Marital Status: Not on file  Intimate Partner Violence:   . Fear of Current or Ex-Partner: Not on file  . Emotionally Abused: Not on file  . Physically Abused: Not on file  . Sexually Abused: Not on file    Family History  Problem Relation Age of Onset  . Heart disease Mother        mitral valve replaced  . Arthritis Mother   . Diabetic kidney disease Daughter   . Diabetes Father   . Stroke Father   . Migraines Sister   . Hypertension Other   . Breast cancer Other   . Colon cancer Neg Hx   . Esophageal cancer Neg Hx   . Stomach cancer Neg Hx   . Rectal cancer Neg Hx     Past Medical History:  Diagnosis Date  . Abnormal echocardiogram    09/2013: mild LVH, mild focal basal hypertrophy of septum, EF 60-65%, normal WM, grade 1 diastolic dysfunction, MAC, mild LAE, ASA, PASP 34. Possible oscillating MV density - reviewed by MD and felt there was no significant abnormality other than MAC noted with mitral valve and did not require TEE.  . Adenomatous colon polyp   . Allergic rhinitis   . Arthritis    "back; ankles; hands; knees" (10/31/2013)  . Bilateral sensorineural hearing loss   . CAD (coronary artery disease)    a. Nonobst in 2011. b. Abnormal nuc 09/2013 -> s/p cutting balloon to D2, mild LAD disease; c. 08/2015 MV: no ischemia, EF 71%.  . Cancer ([place])    cancerous polyps  . Carcinoma in situ in a polyp 1994   a. 1994 - malignant polyp removed during colonoscopy.  . Charcot's Foot    a.  01/2016 s/p  RLE transtibial amputation 2/2 Charcot rocker-bottom deformity and insensate neuropathy ulceration.  . Chronic diastolic CHF (congestive heart failure) ([place])    a. 09/2013 Echo: EF 60-65%, mild LVH, Gr1 DD.  Marland Kitchen Diverticulosis   . DJD (degenerative joint disease)   . DVT (deep venous thrombosis) ([place]) 12/2007  . Dyspnea    a. Chronic, extensive w/u see OV note 09-2010. b. [place] 2011: ormal with RA 5 RV 31/2 PA 27/12 (19) PCW 10 CO normal. No evidence of shunting with sitting up in cath lab. c. CPX 2011: see report. d. Prior fluoro of diaphragm -  R diaphragm elevated at rest but both moved with inspiration.   . Fatty liver   . GERD (gastroesophageal reflux disease)    a. Hx GERD/esophageal dysmotility followed by Dr. Olevia Perches.   Marland Kitchen Head injury due to trauma   . Headache    Optic migraine  . Hemorrhoids   . Hiatal hernia    a. s/p Nissen fundoplication 4536.  Marland Kitchen Hyperlipidemia    a. patient unwilling to use statins.  . Hypertensive heart disease   . IBS (irritable bowel syndrome)   . Macular degeneration 03/2009   Dr. Rosana Hoes  . Myocardial infarction ([place]) 2017  . Neuropathy    a. Hands, feet, legs.  . Orthostatic hypotension   . Osteomyelitis ([place])    a. 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 01/2016 s/p RLE transtibial amputation 2/2 Charcot rocker-bottom deformity and insensate neuropathy ulceration.  . Osteoporosis   . PE (pulmonary embolism) 12/2007   a. PE/DVT after neck surgery 2009. b. coumadin d/c 10-2008.  Marland Kitchen Pneumonia 2015ish  . PONV (postoperative nausea and vomiting) 2009   neck surgery  . PPD positive   . Pulmonary nodule    incidental per CT:  Pet scan 4-9: likely benign, CT 08-2009 no change, no further CTs (Dr. Gwenette Greet)  . Recurrent UTI   . RSD (reflex sympathetic dystrophy)    a. Chronic pain.  Marland Kitchen Spinal stenosis   . Type II diabetes mellitus (HCC)    no on medication  . Venous insufficiency    a. Contributing to  LEE.    Patient Active Problem List   Diagnosis Date Noted  . External hemorrhoids 02/15/2018  . History of three vessel coronary artery bypass - 2017 , in [facility] 09/25/2017  . PVD (peripheral vascular disease) ([place])-- stent L leg 06-2017 Dr Feliberto Gottron [facility] 07/23/2017  . Phantom pain ([place]) 04/01/2017  . Constipation, chronic 04/01/2017  . S/P unilateral BKA (below knee amputation), right ([place]) 12/14/2016  . Toe osteomyelitis, left ([place]) 11/17/2016  . Acute on chronic diastolic CHF (congestive heart failure), NYHA class 3 ([place]) 03/30/2016  . Chest pain 03/30/2016  . Coronary artery disease due to lipid rich plaque 03/30/2016  . Statin intolerance 03/30/2016  . Acute renal failure superimposed on stage 3 chronic kidney disease ([place]) 03/30/2016  . S/P below knee amputation ([place]) 02/18/2016  . Head trauma 11/26/2015  . Memory loss 11/26/2015  . PCP NOTES >>>>>>>>>>>>>>>>>>>> 10/18/2015  . S/P partial colectomy 01/08/2015  . 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 Anemia 01/18/2014  . Rectal bleeding 10/31/2013  . CAD (coronary artery disease)   . Carcinoma in situ in a polyp   . Depression 09/17/2013  . Charcot's joint 08/21/2013  . Foot osteomyelitis, right ([place]) 07/16/2013  . Chronic pain associated with significant psychosocial dysfunction 05/06/2013  . Neuropathy   . Hyperthyroidism   . Lower extremity edema 03/08/2011  . Annual physical exam 03/08/2011  . ALLERGIC RHINITIS 04/15/2010  . ORTHOSTATIC DIZZINESS 04/13/2010  . HIATAL HERNIA 12/06/2009  . GASTRIC ULCER, HX OF 12/06/2009  . DYSPNEA 03/18/2009  . DEGENERATIVE JOINT DISEASE 06/03/2008  . COLONIC POLYPS 01/27/2008  . DIVERTICULOSIS, COLON 01/27/2008  . IRRITABLE BOWEL SYNDROME, HX OF 01/27/2008  . Pulmonary nodule, right 01/22/2008  . Hyperlipidemia 12/30/2007  . Osteoporosis 12/30/2007  . TB SKIN TEST, POSITIVE 08/09/2007  . DM type 2 with diabetic peripheral neuropathy ([place]) 05/09/2007  .  Reflex sympathetic dystrophy-- PAIN MNGMT  05/09/2007  . Essential hypertension 04/15/2007  . GERD 04/15/2007    Past Surgical History:  Procedure Laterality Date  . AMPUTATION  03/06/2012   Procedure: AMPUTATION FOOT;  Surgeon: Newt Minion, MD;  Location: [place];  Service: Orthopedics;  Laterality: Left;  FIFTH RAY AMPUTATION   . AMPUTATION Right 02/18/2016   Procedure: AMPUTATION BELOW KNEE;  Surgeon: Newt Minion, MD;  Location: [place];  Service: Orthopedics;  Laterality: Right;  . AMPUTATION Left 11/22/2016   Procedure: Amputation 4th Toe Left Foot at Metatarsophalangeal Joint;  Surgeon: Newt Minion, MD;  Location: [place];  Service: Orthopedics;  Laterality: Left;  . ANKLE FUSION  09/27/2012   Procedure: ANKLE FUSION;  Surgeon: Newt Minion, MD;  Location: [place];  Service: Orthopedics;  Laterality: Left;  Left Tibiocalcaneal Fusion  . ANKLE FUSION Right 05/09/2013   Procedure: ANKLE FUSION;  Surgeon: Newt Minion, MD;  Location: [place];  Service: Orthopedics;  Laterality: Right;  Excision Osteomyelitis Base 1st MT Right Foot, Fusion Medial Column  . ANTERIOR CERVICAL DECOMP/DISCECTOMY FUSION  12/18/07   For OA,  Dr. Lorin Mercy:  fu by a PE  . CARDIAC CATHETERIZATION  05/2010    at [facility]  . CARPAL TUNNEL RELEASE Left 09/2011  . CATARACT EXTRACTION W/ INTRAOCULAR LENS  IMPLANT, BILATERAL  2004   feb 2004 left, aug 2004 right  . CHOLECYSTECTOMY  03/2002  . CORONARY ANGIOPLASTY  10/31/2013  . CORONARY ARTERY BYPASS GRAFT  09/27/2016  . CYST REMOVAL HAND  06/2003  . FOOT BONE EXCISION Right 06/2009  . LAPAROSCOPIC RIGHT HEMI COLECTOMY N/A 01/08/2015   Procedure: LAPAROSCOPIC ASSISTED RIGHT HEMI COLECTOMY;  Surgeon: Johnathan Hausen, MD;  Location[facility];  Service: General;  Laterality: N/A;  . LEFT HEART CATHETERIZATION WITH CORONARY ANGIOGRAM N/A 10/31/2013   Procedure: LEFT HEART CATHETERIZATION WITH CORONARY ANGIOGRAM;  Surgeon: Jettie Booze, MD;  Location: [facility] CATH LAB;  Service:  Cardiovascular;  Laterality: N/A;  . NASAL SEPTUM SURGERY  09/1963  . NISSEN FUNDOPLICATION  05/30/4165  . PERCUTANEOUS CORONARY INTERVENTION-BALLOON ONLY  10/31/2013   Procedure: PERCUTANEOUS CORONARY INTERVENTION-BALLOON ONLY;  Surgeon: Jettie Booze, MD;  Location: [facility];  Service: Cardiovascular;;  . ROTATOR CUFF REPAIR Right 07/2007   Dr. Percell Miller  . ROTATOR CUFF REPAIR Left 11/2004  . stent in left leg, behind knee  06/27/2017   x2, done at [facility] in [facility]  . TOE AMPUTATION Right 08/2008   3rd toe, Dr. Sharol Given due to osteomyelitis  . VAGINAL HYSTERECTOMY  03/1975  . VEIN LIGATION Bilateral 03/1966  . VENA CAVA FILTER PLACEMENT  01/2010   green filter; "due to blood clots"  . WISDOM TOOTH EXTRACTION  06/2007    Current Outpatient Medications  Medication Sig Dispense Refill  . Alpha-Lipoic Acid 600 MG CAPS Take 600 mg by mouth 2 (two) times daily.    Marland Kitchen amitriptyline (ELAVIL) 25 MG tablet Take 1 tablet (25 mg total) by mouth at bedtime. 90 tablet 3  . atorvastatin (LIPITOR) 40 MG tablet Take 40 mg by mouth daily.    . carvedilol (COREG) 25 MG tablet Take 25 mg by mouth 2 (two) times daily with a meal.    . Cholecalciferol (VITAMIN D3) 5000 UNITS CAPS Take 5,000 Units by mouth every evening. Reported on 11/24/2015    . ciclopirox (LOPROX) 0.77 % cream Apply 1 application topically 2 (two) times daily as needed.    . clopidogrel (PLAVIX) 75 MG  tablet Take 75 mg by mouth at bedtime.     . Cyanocobalamin (VITAMIN B-12) 500 MCG SUBL Place 500 mcg under the tongue daily.    . diclofenac Sodium (VOLTAREN) 1 % GEL Apply 2-4 g topically 4 (four) times daily as needed. 100 g 5  . dicyclomine (BENTYL) 10 MG capsule Take 1 capsule (10 mg total) by mouth 3 (three) times daily before meals. 90 capsule 3  . famotidine (PEPCID) 20 MG tablet Take 1 tablet (20 mg total) by mouth daily. 10 tablet 0  . ferrous sulfate (FEROSUL) 325 (65 FE) MG tablet Take 1 tablet (325 mg total) by  mouth 2 (two) times daily with a meal. 180 tablet 1  . furosemide (LASIX) 20 MG tablet Take 20 mg by mouth daily.    Marland Kitchen glucose blood (FREESTYLE LITE) test strip USE TO CHECK BLOOD SUGAR NO MORE THAN TWICE DAILY 200 each 12  . HYDROcodone-acetaminophen (NORCO/VICODIN) 5-325 MG tablet Take 1 tablet by mouth every 6 (six) hours as needed for moderate pain. 120 tablet 0  . hydrocortisone (ANUSOL-HC) 2.5 % rectal cream Place 1 application rectally 2 (two) times daily. (Patient not taking: Reported on 06/11/2020) 30 g 3  . hydrocortisone (ANUSOL-HC) 25 MG suppository Place 1 suppository (25 mg total) rectally every 12 (twelve) hours. (Patient not taking: Reported on 06/11/2020) 30 suppository 1  . Lancets (FREESTYLE) lancets CHECK BLOOD SUGAR TWICE DAILY 200 each 12  . lidocaine (LIDODERM) 5 % Place 1 patch onto the skin daily. Remove & Discard patch within 12 hours or as directed by MD 30 patch 3  . lubiprostone ([name]) 24 MCG capsule Take 1 capsule (24 mcg total) by mouth 2 (two) times daily with a meal. 60 capsule 2  . methenamine (HIPREX) 1 g tablet Take 1 tablet by mouth 2 (two) times daily.  4  . miconazole (MICOTIN) 2 % cream Apply 1 application topically 2 (two) times daily. For 4 weeks 28.35 g 0  . Multiple Vitamins-Minerals (PRESERVISION AREDS 2) CAPS Take 1 capsule by mouth 2 (two) times daily.    . nitroGLYCERIN (NITROSTAT) 0.4 MG SL tablet Place 1 tablet (0.4 mg total) under the tongue every 5 (five) minutes x 3 doses as needed for chest pain. (Patient not taking: Reported on 06/11/2020) 25 tablet 3  . omeprazole (PRILOSEC) 20 MG capsule TAKE 1 CAPSULE(20 MG) BY MOUTH TWICE DAILY 180 capsule 3  . ondansetron (ZOFRAN-ODT) 4 MG disintegrating tablet TAKE 1 TABLET BY MOUTH EVERY 8 HOURS AS NEEDED FOR NAUSEA OR VOMITING 30 tablet 0  . OVER THE COUNTER MEDICATION Take 1 capsule by mouth 2 (two) times daily. Integrative Digestive Formula    . OVER THE COUNTER MEDICATION 1 tablet 3 (three) times  daily. [place] Gluco Defense     . Polyvinyl Alcohol-Povidone (REFRESH OP) Place 1 drop into both eyes 3 (three) times daily as needed (dry eyes).     . pramipexole (MIRAPEX) 0.125 MG tablet Take 1-2 tablets (0.125-0.25 mg total) by mouth at bedtime as needed. 180 tablet 1  . predniSONE (DELTASONE) 10 MG tablet Take 2 tablets (20 mg total) by mouth daily with breakfast. 60 tablet 0  . rivaroxaban (XARELTO) 2.5 MG TABS tablet Take 2.5 mg by mouth 2 (two) times daily.    Marland Kitchen augmented betamethasone dipropionate (DIPROLENE-AF) 0.05 % cream Apply topically 2 (two) times daily. 60 g 0  . Galcanezumab-gnlm (EMGALITY) 120 MG/ML SOAJ Inject 120 mg into the skin every 30 (thirty) days. 1 mL 11  .  meclizine (ANTIVERT) 25 MG tablet Take 1 tablet (25 mg total) by mouth 2 (two) times daily as needed for dizziness. 180 tablet 0   No current facility-administered medications for this visit.    Allergies as of 06/10/2020 - Review Complete 06/10/2020  Allergen Reaction Noted  . Cymbalta [duloxetine hcl] Swelling 09/25/2012  . Gabapentin Swelling 09/25/2012  . Silicone Hives, Itching, Dermatitis, and Rash   . Metformin and related Other (See Comments) 12/10/2015  . Adhesive [tape] Rash 07/17/2011  . Cefazolin Hives 03/06/2012  . Ciprofloxacin Other (See Comments) 01/31/2013  . Clorazepate dipotassium Other (See Comments)   . Dilaudid [hydromorphone hcl] Other (See Comments) 03/11/2010  . Doxycycline Rash 11/03/2008  . Enablex [darifenacin hydrobromide er] Other (See Comments) 03/06/2012  . Levofloxacin Other (See Comments) 01/08/2014  . Lyrica [pregabalin] Swelling 05/08/2013  . Methadone hcl Other (See Comments)   . Morphine and related Other (See Comments) 10/31/2013  . Talwin [pentazocine] Other (See Comments) 12/04/2012    Vitals: BP 109/65 (BP Location: Left Arm, Patient Position: Sitting)   Pulse 88   Ht _0  (1.626 m)   Wt 170 lb (77.1 kg) Comment: pt reported  BMI 29.18 kg/m  Last  Weight:  Wt Readings from Last 1 Encounters:  06/11/20 170 lb (77.1 kg)   Last Height:   Ht Readings from Last 1 Encounters:  06/11/20 _1  (1.626 m)     Physical exam: Exam: Gen: NAD, conversant, well nourised,  Neuro: Detailed Neurologic Exam  Speech:    Speech is normal; fluent and spontaneous with normal comprehension.  Cognition:    The patient is oriented to person, place, and time;  Cranial Nerves:    The pupils are equal, round, and reactive to light. Visual fields are full to threat. Extraocular movements are intact. Trigeminal sensation is intact and the muscles of mastication are normal. The face is symmetric. The palate elevates in the midline. Hearing impaired. Voice is normal. Shoulder shrug is normal. The tongue has normal motion without fasciculations.   Gait:   In a wheelchair  Motor Observation:    No asymmetry, no atrophy, and no involuntary movements noted. Tone:    Normal muscle tone.     Strength:    Strength is actually quite good and I do not appreciate any focal weakness. BKA., left LE RSD, she has leg brace     Assessment/Plan:  85 y.o. female here as requested by Newt Minion, MD for neck pain.  Patient is well-known to [place] neurolog[facility] and we have seen her for multiple conditions.  She has a very complicated and extensive history including a past medical history of chronic migraines, spinal stenosis, reflex sympathetic dystrophy, osteoporosis, orthostatic hypotension, polyneuropathy, hypertension, hyperlipidemia, falls, head injury due to trauma, DVTs, degenerative joint disease, chronic diastolic congestive heart failure, coronary artery disease, hearing loss, arthritis, chronic pain, peripheral vascular disease, diabetes, depression, lower extremity edema, partial colectomy, memory loss, below the knee amputation, joint pain, reflex sympathetic dystrophy leg, BKA. She has been to orthopaedics and pain specialists and currently sees Dr. Sharol Given in Ortho,  many years of chronic neck and back pain and ambulation issues for many reasons, she has tried multiple neuropathic pain medications in the past.   -  I had a long discussion with her and her son today, at this time I do not feel as though we should put another oral medication on her list due to polypharmacy, we can try Emgality for her and I did inject  her in the office today for her migraines.   - Patient complaining of falls again and not understanding why she falls - I told patient she has multiple risk factors for falls and she cannot do things like bend over and pick up dog beds because that puts her more at risk of falls; doing things like this will inevitably lead to her losing her balance due to her extensive problems as above. I will image her brain and cervical spine to ensure no strokes, myelopathy or other causes but at this time patient needs to be compliant with fall prevention recommendations.  - Patient already sees orthopaedics for her many years of chronic neck and back pain and is managed by Dr. Sharol Given who recently adjusted some pain management medications. She can continue to follow with Dr. Sharol Given but I will image her brain and cervical spine as above. - Spent a very long time discussing fall risks, polypharmacy, fall prevention, migraines, chronic neck and back pain.We willimage her briain and cervical spine. Patient to follow with Dr. Sharol Given and pain management for these. We can continue to follow her for migraines.  Orders Placed This Encounter  Procedures  . Basic Metabolic Panel   Meds ordered this encounter  Medications  . Galcanezumab-gnlm (EMGALITY) 120 MG/ML SOAJ    Sig: Inject 120 mg into the skin every 30 (thirty) days.    Dispense:  1 mL    Refill:  11    Cc: Newt Minion, MD,  Colon Branch, MD  Sarina Ill, MD  [facility] [address] [place], [place] [address]  Phone [phone] Fax [phone]  I spent over 60 minutes  of face-to-face and non-face-to-face time with patient on the  1. Chronic neck pain   2. Fall, subsequent encounter   3. Chronic migraine without aura, with intractable migraine, so stated, with status migrainosus   4. Gait abnormality   5. Chronic neck pain with abnormal neurologic examination    diagnosis.  This included previsit chart review, lab review, study review, order entry, electronic health record documentation, patient education on the different diagnostic and therapeutic options, counseling and coordination of care, risks and benefits of management, compliance, or risk factor reduction

## 2020-06-11 ENCOUNTER — Encounter: Payer: Self-pay | Admitting: Internal Medicine

## 2020-06-11 ENCOUNTER — Other Ambulatory Visit: Payer: Self-pay

## 2020-06-11 ENCOUNTER — Ambulatory Visit (INDEPENDENT_AMBULATORY_CARE_PROVIDER_SITE_OTHER): Payer: Medicare Other | Admitting: Internal Medicine

## 2020-06-11 VITALS — BP 107/69 | HR 87 | Temp 98.1°F | Resp 16 | Ht 64.0 in | Wt 170.0 lb

## 2020-06-11 DIAGNOSIS — I2510 Atherosclerotic heart disease of native coronary artery without angina pectoris: Secondary | ICD-10-CM

## 2020-06-11 DIAGNOSIS — E782 Mixed hyperlipidemia: Secondary | ICD-10-CM | POA: Diagnosis not present

## 2020-06-11 DIAGNOSIS — E1142 Type 2 diabetes mellitus with diabetic polyneuropathy: Secondary | ICD-10-CM | POA: Diagnosis not present

## 2020-06-11 DIAGNOSIS — Z79899 Other long term (current) drug therapy: Secondary | ICD-10-CM | POA: Diagnosis not present

## 2020-06-11 DIAGNOSIS — M810 Age-related osteoporosis without current pathological fracture: Secondary | ICD-10-CM

## 2020-06-11 DIAGNOSIS — G9050 Complex regional pain syndrome I, unspecified: Secondary | ICD-10-CM

## 2020-06-11 DIAGNOSIS — I10 Essential (primary) hypertension: Secondary | ICD-10-CM | POA: Diagnosis not present

## 2020-06-11 LAB — LIPID PANEL
Cholesterol: 115 mg/dL (ref 0–200)
HDL: 44.2 mg/dL (ref >39.00)
LDL Cholesterol: 42 mg/dL (ref 0–99)
NonHDL: 71.27
Total CHOL/HDL Ratio: 3
Triglycerides: 144 mg/dL (ref 0.0–149.0)
VLDL: 28.8 mg/dL (ref 0.0–40.0)

## 2020-06-11 LAB — BASIC METABOLIC PANEL
BUN/Creatinine Ratio: 18 (ref 12–28)
BUN: 20 mg/dL (ref 8–27)
CO2: 23 mmol/L (ref 20–29)
Calcium: 9.1 mg/dL (ref 8.7–10.3)
Chloride: 102 mmol/L (ref 96–106)
Creatinine, Ser: 1.12 mg/dL — ABNORMAL HIGH (ref 0.57–1.00)
GFR calc Af Amer: 53 mL/min/{1.73_m2} — ABNORMAL LOW (ref >59)
GFR calc non Af Amer: 46 mL/min/{1.73_m2} — ABNORMAL LOW (ref >59)
Glucose: 151 mg/dL — ABNORMAL HIGH (ref 65–99)
Potassium: 4.5 mmol/L (ref 3.5–5.2)
Sodium: 138 mmol/L (ref 134–144)

## 2020-06-11 LAB — HEMOGLOBIN A1C: Hgb A1c MFr Bld: 6.5 % (ref 4.6–6.5)

## 2020-06-11 LAB — AST: AST: 10 U/L (ref 0–37)

## 2020-06-11 LAB — ALT: ALT: 7 U/L (ref 0–35)

## 2020-06-11 MED ORDER — BETAMETHASONE DIPROPIONATE AUG 0.05 % EX CREA: TOPICAL_CREAM | Freq: Two times a day (BID) | CUTANEOUS | 0 refills | Status: DC

## 2020-06-11 MED ORDER — DENOSUMAB 60 MG/ML ~~LOC~~ SOSY
60.0000 mg | PREFILLED_SYRINGE | Freq: Once | SUBCUTANEOUS | Status: AC
  Administered 2020-06-11: 60 mg via SUBCUTANEOUS

## 2020-06-11 MED ORDER — MECLIZINE HCL 25 MG PO TABS
25.0000 mg | ORAL_TABLET | Freq: Two times a day (BID) | ORAL | 0 refills | Status: DC | PRN
Start: 2020-06-11 — End: 2020-11-30

## 2020-06-11 NOTE — Progress Notes (Signed)
Subjective:    Patient ID: [name], female    DOB: 08-26-1935, 85 y.o.   MRN: [id number]  DOS:  06/11/2020 Type of visit - description: Follow-up, here with her son Dellis Filbert. Today with talk about diabetes, hypertension, high cholesterol. She also developed a mild rash after the first Covid shot and now more intense rash after the second injection on April 2021. The rash is healed but c/o pruritus _0  chest and B  shoulders.    Review of Systems Denies chest pain or difficulty breathing. She has developed some edema on the left leg, unsure for how long. Orthopnea?  Hard to assess, she typically sleeps on bed w/ elevated  head.   Past Medical History:  Diagnosis Date  . Abnormal echocardiogram    09/2013: mild LVH, mild focal basal hypertrophy of septum, EF 60-65%, normal WM, grade 1 diastolic dysfunction, MAC, mild LAE, ASA, PASP 34. Possible oscillating MV density - reviewed by MD and felt there was no significant abnormality other than MAC noted with mitral valve and did not require TEE.  . Adenomatous colon polyp   . Allergic rhinitis   . Arthritis    "back; ankles; hands; knees" (10/31/2013)  . Bilateral sensorineural hearing loss   . CAD (coronary artery disease)    a. Nonobst in 2011. b. Abnormal nuc 09/2013 -> s/p cutting balloon to D2, mild LAD disease; c. 08/2015 MV: no ischemia, EF 71%.  . Cancer ([place])    cancerous polyps  . Carcinoma in situ in a polyp 1994   a. 1994 - malignant polyp removed during colonoscopy.  . Charcot's Foot    a.  01/2016 s/p RLE transtibial amputation 2/2 Charcot rocker-bottom deformity and insensate neuropathy ulceration.  . Chronic diastolic CHF (congestive heart failure) ([place])    a. 09/2013 Echo: EF 60-65%, mild LVH, Gr1 DD.  Marland Kitchen Diverticulosis   . DJD (degenerative joint disease)   . DVT (deep venous thrombosis) ([place]) 12/2007  . Dyspnea    a. Chronic, extensive w/u see OV note 09-2010. b. [place] 2011: ormal with RA 5 RV 31/2 PA 27/12  (19) PCW 10 CO normal. No evidence of shunting with sitting up in cath lab. c. CPX 2011: see report. d. Prior fluoro of diaphragm -  R diaphragm elevated at rest but both moved with inspiration.   . Fatty liver   . GERD (gastroesophageal reflux disease)    a. Hx GERD/esophageal dysmotility followed by Dr. Olevia Perches.   Marland Kitchen Head injury due to trauma   . Headache    Optic migraine  . Hemorrhoids   . Hiatal hernia    a. s/p Nissen fundoplication 2637.  Marland Kitchen Hyperlipidemia    a. patient unwilling to use statins.  . Hypertensive heart disease   . IBS (irritable bowel syndrome)   . Macular degeneration 03/2009   Dr. Rosana Hoes  . Myocardial infarction ([place]) 2017  . Neuropathy    a. Hands, feet, legs.  . Orthostatic hypotension   . Osteomyelitis ([place])    a. 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 01/2016 s/p RLE transtibial amputation 2/2 Charcot rocker-bottom deformity and insensate neuropathy ulceration.  . Osteoporosis   . PE (pulmonary embolism) 12/2007   a. PE/DVT after neck surgery 2009. b. coumadin d/c 10-2008.  Marland Kitchen Pneumonia 2015ish  . PONV (postoperative nausea and vomiting) 2009   neck surgery  . PPD positive   . Pulmonary nodule    incidental per CT:  Pet scan 4-9: likely benign, CT 08-2009 no change, no further CTs (Dr. Gwenette Greet)  . Recurrent UTI   . RSD (reflex sympathetic dystrophy)    a. Chronic pain.  Marland Kitchen Spinal stenosis   . Type II diabetes mellitus (H[facility])    no on medication  . Venous insufficiency    a. Contributing to LEE.    Past Surgical History:  Procedure Laterality Date  . AMPUTATION  03/06/2012   Procedure: AMPUTATION FOOT;  Surgeon: Newt Minion, MD;  Location: [facility];  Service: Orthopedics;  Laterality: Left;  FIFTH RAY AMPUTATION   . AMPUTATION Right 02/18/2016   Procedure: AMPUTATION BELOW KNEE;  Surgeon: Newt Minion, MD;  Location: [facility];  Service: Orthopedics;  Laterality: Right;  . AMPUTATION Left 11/22/2016    Procedure: Amputation 4th Toe Left Foot at Metatarsophalangeal Joint;  Surgeon: Newt Minion, MD;  Location: [facility];  Service: Orthopedics;  Laterality: Left;  . ANKLE FUSION  09/27/2012   Procedure: ANKLE FUSION;  Surgeon: Newt Minion, MD;  Location: [facility];  Service: Orthopedics;  Laterality: Left;  Left Tibiocalcaneal Fusion  . ANKLE FUSION Right 05/09/2013   Procedure: ANKLE FUSION;  Surgeon: Newt Minion, MD;  Location: [facility];  Service: Orthopedics;  Laterality: Right;  Excision Osteomyelitis Base 1st MT Right Foot, Fusion Medial Column  . ANTERIOR CERVICAL DECOMP/DISCECTOMY FUSION  12/18/07   For OA,  Dr. Lorin Mercy:  fu by a PE  . CARDIAC CATHETERIZATION  05/2010    at [facility]  . CARPAL TUNNEL RELEASE Left 09/2011  . CATARACT EXTRACTION W/ INTRAOCULAR LENS  IMPLANT, BILATERAL  2004   feb 2004 left, aug 2004 right  . CHOLECYSTECTOMY  03/2002  . CORONARY ANGIOPLASTY  10/31/2013  . CORONARY ARTERY BYPASS GRAFT  09/27/2016  . CYST REMOVAL HAND  06/2003  . FOOT BONE EXCISION Right 06/2009  . LAPAROSCOPIC RIGHT HEMI COLECTOMY N/A 01/08/2015   Procedure: LAPAROSCOPIC ASSISTED RIGHT HEMI COLECTOMY;  Surgeon: Johnathan Hausen, MD;  Location: [facility];  Service: General;  Laterality: N/A;  . LEFT HEART CATHETERIZATION WITH CORONARY ANGIOGRAM N/A 10/31/2013   Procedure: LEFT HEART CATHETERIZATION WITH CORONARY ANGIOGRAM;  Surgeon: Jettie Booze, MD;  Location: [facility] CATH LAB;  Service: Cardiovascular;  Laterality: N/A;  . NASAL SEPTUM SURGERY  09/1963  . NISSEN FUNDOPLICATION  10/28/1094  . PERCUTANEOUS CORONARY INTERVENTION-BALLOON ONLY  10/31/2013   Procedure: PERCUTANEOUS CORONARY INTERVENTION-BALLOON ONLY;  Surgeon: Jettie Booze, MD;  Location: [facility] CATH LAB;  Service: Cardiovascular;;  . ROTATOR CUFF REPAIR Right 07/2007   Dr. Percell Miller  . ROTATOR CUFF REPAIR Left 11/2004  . stent in left leg, behind knee  06/27/2017   x2, done at [facility] cardiology in [facility]  . TOE AMPUTATION Right 08/2008    3rd toe, Dr. Sharol Given due to osteomyelitis  . VAGINAL HYSTERECTOMY  03/1975  . VEIN LIGATION Bilateral 03/1966  . VENA CAVA FILTER PLACEMENT  01/2010   green filter; "due to blood clots"  . WISDOM TOOTH EXTRACTION  06/2007    Allergies as of 06/11/2020      Reactions   Cymbalta [duloxetine Hcl] Swelling   Swelling in legs   Gabapentin Swelling   Swelling in legs Swelling in legs   Silicone Hives, Itching, Dermatitis, Rash   Metformin And Related Other (See Comments)   dizzy, tired, chills, diarrhea, and nausea   Adhesive [tape] Rash   rash rash   Cefazolin Hives   Hives Hives   Ciprofloxacin  Other (See Comments)   Other reaction(s): Other (See Comments) Per pt, caused body aches Per pt, caused body aches   Clorazepate Dipotassium Other (See Comments)   Unknown reaction   Dilaudid [hydromorphone Hcl] Other (See Comments)    confused, intense itching   Doxycycline Rash   Enablex [darifenacin Hydrobromide Er] Other (See Comments)   Hypotension, near syncope   Levofloxacin Other (See Comments)   Causes wrist pain   Lyrica [pregabalin] Swelling   Swelling in legs   Methadone Hcl Other (See Comments)   Reaction unknown   Morphine And Related Other (See Comments)   Confusion, constipation.   Talwin [pentazocine] Other (See Comments)   "climbing walls" anxiety      Medication List       Accurate as of June 11, 2020 11:59 PM. If you have any questions, ask your nurse or doctor.        Alpha-Lipoic Acid 600 MG Caps Take 600 mg by mouth 2 (two) times daily.   amitriptyline 25 MG tablet Commonly known as: ELAVIL Take 1 tablet (25 mg total) by mouth at bedtime.   atorvastatin 40 MG tablet Commonly known as: LIPITOR Take 40 mg by mouth daily.   augmented betamethasone dipropionate 0.05 % cream Commonly known as: DIPROLENE-AF Apply topically 2 (two) times daily. Started by: [name], MD   carvedilol 25 MG tablet Commonly known as: COREG Take 25 mg by mouth 2 (two)  times daily with a meal.   ciclopirox 0.77 % cream Commonly known as: LOPROX Apply 1 application topically 2 (two) times daily as needed.   clopidogrel 75 MG tablet Commonly known as: PLAVIX Take 75 mg by mouth at bedtime.   diclofenac Sodium 1 % Gel Commonly known as: VOLTAREN Apply 2-4 g topically 4 (four) times daily as needed.   dicyclomine 10 MG capsule Commonly known as: BENTYL Take 1 capsule (10 mg total) by mouth 3 (three) times daily before meals.   famotidine 20 MG tablet Commonly known as: PEPCID Take 1 tablet (20 mg total) by mouth daily.   ferrous sulfate 325 (65 FE) MG tablet Commonly known as: FeroSul Take 1 tablet (325 mg total) by mouth 2 (two) times daily with a meal.   freestyle lancets CHECK BLOOD SUGAR TWICE DAILY   FREESTYLE LITE test strip Generic drug: glucose blood USE TO CHECK BLOOD SUGAR NO MORE THAN TWICE DAILY   furosemide 20 MG tablet Commonly known as: LASIX Take 20 mg by mouth daily.   HYDROcodone-acetaminophen 5-325 MG tablet Commonly known as: NORCO/VICODIN Take 1 tablet by mouth every 6 (six) hours as needed for moderate pain.   hydrocortisone 2.5 % rectal cream Commonly known as: ANUSOL-HC Place 1 application rectally 2 (two) times daily.   hydrocortisone 25 MG suppository Commonly known as: ANUSOL-HC Place 1 suppository (25 mg total) rectally every 12 (twelve) hours.   lidocaine 5 % Commonly known as: Lidoderm Place 1 patch onto the skin daily. Remove & Discard patch within 12 hours or as directed by MD   lubiprostone 24 MCG capsule Commonly known as: [name] Take 1 capsule (24 mcg total) by mouth 2 (two) times daily with a meal.   meclizine 25 MG tablet Commonly known as: ANTIVERT Take 1 tablet (25 mg total) by mouth 2 (two) times daily as needed for dizziness.   methenamine 1 g tablet Commonly known as: HIPREX Take 1 tablet by mouth 2 (two) times daily.   miconazole 2 % cream Commonly known as: MICOTIN Apply  1  application topically 2 (two) times daily. For 4 weeks   nitroGLYCERIN 0.4 MG SL tablet Commonly known as: NITROSTAT Place 1 tablet (0.4 mg total) under the tongue every 5 (five) minutes x 3 doses as needed for chest pain.   omeprazole 20 MG capsule Commonly known as: PRILOSEC TAKE 1 CAPSULE(20 MG) BY MOUTH TWICE DAILY   ondansetron 4 MG disintegrating tablet Commonly known as: ZOFRAN-ODT TAKE 1 TABLET BY MOUTH EVERY 8 HOURS AS NEEDED FOR NAUSEA OR VOMITING   OVER THE COUNTER MEDICATION 1 tablet 3 (three) times daily. Berberorine Gluco Defense   OVER THE COUNTER MEDICATION Take 1 capsule by mouth 2 (two) times daily. Integrative Digestive Formula   pramipexole 0.125 MG tablet Commonly known as: MIRAPEX Take 1-2 tablets (0.125-0.25 mg total) by mouth at bedtime as needed.   predniSONE 10 MG tablet Commonly known as: DELTASONE Take 2 tablets (20 mg total) by mouth daily with breakfast. What changed: Another medication with the same name was removed. Continue taking this medication, and follow the directions you see here. Changed by: [name], MD   PreserVision AREDS 2 Caps Take 1 capsule by mouth 2 (two) times daily.   REFRESH OP Place 1 drop into both eyes 3 (three) times daily as needed (dry eyes).   Vitamin B-12 500 MCG Subl Place 500 mcg under the tongue daily.   Vitamin D3 125 MCG (5000 UT) Caps Take 5,000 Units by mouth every evening. Reported on 11/24/2015   Xarelto 2.5 MG Tabs tablet Generic drug: rivaroxaban Take 2.5 mg by mouth 2 (two) times daily.          Objective:   Physical Exam Skin:        BP 107/69 (BP Location: Right Arm, Patient Position: Sitting, Cuff Size: Small)   Pulse 87   Temp 98.1 F (36.7 C) (Oral)   Resp 16   Ht _0  (1.626 m)   Wt 170 lb (77.1 kg)   SpO2 99%   BMI 29.18 kg/m  General:   Well developed, NAD, BMI noted. HEENT:  Normocephalic . Face symmetric, atraumatic Lungs:  CTA B Normal respiratory effort, no  intercostal retractions, no accessory muscle use. Heart: RRR,  no murmur.  Lower extremities: Left leg mild edema mostly distally Right leg: S/p BKA. Skin: Not pale. Not jaundice Neurologic:  alert & oriented X3.  Speech normal, gait not tested. Psych--  Cognition and judgment appear intact.  Cooperative with normal attention span and concentration.  Behavior appropriate. No anxious or depressed appearing.      Assessment      Assessment DM, Neuropathy, amputations due to Charcot feet. - metformin intolerant  (lethargic) HTN  Hyperlipidemia LUNG MASS -incidental per CT 2009,  PET scan 01-2008: likely  benign, CT 08-2009 no change, no further CTs per Dr Gwenette Greet - Had a CT in [place] 09-2016 "spiculated mass", saw Dr Lamonte Sakai,  CT  09/2017 stable CV: ---CAD sees Dr Meda Coffee and a cardiologist in [facility] --NSTEMI 09-2016. Outside hospital: Cath 2 vessel disease, CABG 2, LIMA to LAD and Diag   ---Chronic diastolic CHF ---PVD: Aortogram and a stent L Leg Dr Feliberto Gottron, 06-27-2017 @ [facility] (per pt) GI: GERD, IBS, colon polyps, PUD, abdominal pain "post polypectomy syndrome" naueas meds prn (antivert-zofran, gets nausea when car traveling) Osteoporosis  MSK,pain mngmt : --Reflex sympathetic dystrophy --Neuropathy --DJD --Spinal stenosis  --H/o Osteomyelitis  Foot --s/p amputation:  4th 5th L toes , R BKA  (01-2016, dx osteomyelitis) w/ phantom  pain  -- sees Dr Sharol Given prn She had local injections with Dr. Maia Petties 2016 w/o apparent help. She took oxycodone after surgery and that did NOT seem to help better than hydrocodone. Intolerant to Cymbalta, Neurontin ; Lyrica helped but caused edema. Recurrent UTIs: Dr Junious Silk  Venous insufficiency  H/o  + PPD H/o hypothyroidism: TSHs stable H/o dyspnea   PLAN:  Here w/ son Dellis Filbert DM: Diet controlled, check A1c,was rx  prednisone for back pain, monitor CBGs, goal to keep in less than 200 while on prednisone. HTN: Ambulatory BPs normal, today  slightly low, currently on Carvedilol, Lasix,last BMP okay.  No change. High cholesterol: Check a FLP.  Continue atorvastatin Migraines: Saw neurology yesterday, improved since she increased amitriptyline dose MSK: Saw Dr. Sharol Given yesterday, had acute back pain with right sciatica, Rx prednisone, MRI if no better for possible epidural injections. Pain management, check a UDS.  On hydrocodone. Rash: After Covid vaccinations at the upper chest, now has some excoriations due to itching and scratching.  Prescribed topical steroids.   CAD, CHF, PVD: From the cardiovascular standpoint, she has no chest pain or difficulty breathing.  She does have some swelling at the left leg, BP today slightly low.  Recommend leg elevation consistently, low-salt, observation. Recommend to see cardiology regularly Osteoporosis: T score -3.3,  2018. Had a Prolia 06-2019, did not have   12-2019, Prolia provided today. RTC to 4 months.   This visit occurred during the SARS-CoV-2 public health emergency.  Safety protocols were in place, including screening questions prior to the visit, additional usage of staff PPE, and extensive cleaning of exam room while observing appropriate contact time as indicated for disinfecting solutions.

## 2020-06-11 NOTE — Progress Notes (Signed)
Pre visit review using our clinic review tool, if applicable. No additional management support is needed unless otherwise documented below in the visit note. 

## 2020-06-11 NOTE — Patient Instructions (Addendum)
Per our records you are due for an eye exam. Please contact your eye doctor to schedule an appointment. Please have them send copies of your office visit notes to [place]. Our fax number is [phone].  Check the  blood pressure regularly BP GOAL is between 110/65 and  135/85. If it is consistently higher or lower, let me know  For rash, use the cream as needed  For swelling: Leg elevation at night and during the daytime when possible.  See your heart doctors regularly  Check your blood sugars, they should not be more than 200: Prednisone   GO TO THE LAB : Get the blood work     [place], [place] back for a checkup in 4 months

## 2020-06-13 LAB — DRUG MONITORING, PANEL 8 WITH CONFIRMATION, URINE
6 Acetylmorphine: NEGATIVE ng/mL (ref <10)
Alcohol Metabolites: NEGATIVE ng/mL
Amphetamines: NEGATIVE ng/mL (ref <500)
Benzodiazepines: NEGATIVE ng/mL (ref <100)
Buprenorphine, Urine: NEGATIVE ng/mL (ref <5)
Cocaine Metabolite: NEGATIVE ng/mL (ref <150)
Codeine: NEGATIVE ng/mL (ref <50)
Creatinine: 120.4 mg/dL
Hydrocodone: 3093 ng/mL — ABNORMAL HIGH (ref <50)
Hydromorphone: 573 ng/mL — ABNORMAL HIGH (ref <50)
MDMA: NEGATIVE ng/mL (ref <500)
Marijuana Metabolite: NEGATIVE ng/mL (ref <20)
Morphine: NEGATIVE ng/mL (ref <50)
Norhydrocodone: 2754 ng/mL — ABNORMAL HIGH (ref <50)
Opiates: POSITIVE ng/mL — AB (ref <100)
Oxidant: NEGATIVE ug/mL
Oxycodone: NEGATIVE ng/mL (ref <100)
pH: 5.5 (ref 4.5–9.0)

## 2020-06-13 LAB — DM TEMPLATE

## 2020-06-13 NOTE — Assessment & Plan Note (Signed)
Here w/ son Dellis Filbert DM: Diet controlled, check A1c,was rx  prednisone for back pain, monitor CBGs, goal to keep in less than 200 while on prednisone. HTN: Ambulatory BPs normal, today slightly low, currently on Carvedilol, Lasix,last BMP okay.  No change. High cholesterol: Check a FLP.  Continue atorvastatin Migraines: Saw neurology yesterday, improved since she increased amitriptyline dose MSK: Saw Dr. Sharol Given yesterday, had acute back pain with right sciatica, Rx prednisone, MRI if no better for possible epidural injections. Pain management, check a UDS.  On hydrocodone. Rash: After Covid vaccinations at the upper chest, now has some excoriations due to itching and scratching.  Prescribed topical steroids.   CAD, CHF, PVD: From the cardiovascular standpoint, she has no chest pain or difficulty breathing.  She does have some swelling at the left leg, BP today slightly low.  Recommend leg elevation consistently, low-salt, observation. Recommend to see cardiology regularly Osteoporosis: T score -3.3,  2018. Had a Prolia 06-2019, did not have   12-2019, Prolia provided today. RTC to 4 months.

## 2020-06-14 ENCOUNTER — Encounter: Payer: Self-pay | Admitting: Neurology

## 2020-06-14 MED ORDER — EMGALITY 120 MG/ML ~~LOC~~ SOAJ: 120.0000 mg | SUBCUTANEOUS | 11 refills | Status: DC

## 2020-06-15 ENCOUNTER — Telehealth: Payer: Self-pay | Admitting: Neurology

## 2020-06-15 NOTE — Telephone Encounter (Signed)
Medicare/tricare order sent to GI. No auth they will reach out to the patient to schedule.  

## 2020-06-16 DIAGNOSIS — I70213 Atherosclerosis of native arteries of extremities with intermittent claudication, bilateral legs: Secondary | ICD-10-CM | POA: Diagnosis not present

## 2020-06-17 DIAGNOSIS — G894 Chronic pain syndrome: Secondary | ICD-10-CM | POA: Diagnosis not present

## 2020-06-17 DIAGNOSIS — S88111D Complete traumatic amputation at level between knee and ankle, right lower leg, subsequent encounter: Secondary | ICD-10-CM | POA: Diagnosis not present

## 2020-06-17 DIAGNOSIS — R2689 Other abnormalities of gait and mobility: Secondary | ICD-10-CM | POA: Diagnosis not present

## 2020-06-17 DIAGNOSIS — M6281 Muscle weakness (generalized): Secondary | ICD-10-CM | POA: Diagnosis not present

## 2020-06-24 ENCOUNTER — Telehealth: Payer: Self-pay | Admitting: Orthopedic Surgery

## 2020-06-24 ENCOUNTER — Telehealth: Payer: Self-pay

## 2020-06-24 ENCOUNTER — Other Ambulatory Visit: Payer: Self-pay

## 2020-06-24 DIAGNOSIS — G894 Chronic pain syndrome: Secondary | ICD-10-CM | POA: Diagnosis not present

## 2020-06-24 DIAGNOSIS — R2689 Other abnormalities of gait and mobility: Secondary | ICD-10-CM | POA: Diagnosis not present

## 2020-06-24 DIAGNOSIS — M6281 Muscle weakness (generalized): Secondary | ICD-10-CM | POA: Diagnosis not present

## 2020-06-24 DIAGNOSIS — S88111D Complete traumatic amputation at level between knee and ankle, right lower leg, subsequent encounter: Secondary | ICD-10-CM | POA: Diagnosis not present

## 2020-06-24 DIAGNOSIS — M5441 Lumbago with sciatica, right side: Secondary | ICD-10-CM

## 2020-06-24 DIAGNOSIS — M810 Age-related osteoporosis without current pathological fracture: Secondary | ICD-10-CM

## 2020-06-24 NOTE — Telephone Encounter (Signed)
06/10/20 (amended) ov note faxed to [place] [phone]

## 2020-06-24 NOTE — Telephone Encounter (Signed)
Patient called. She would like to know if she could get a MRI on her back. Her call back number is [phone]

## 2020-06-24 NOTE — Telephone Encounter (Signed)
Pt called requesting a referral to [facility] for a bone density scan.

## 2020-06-24 NOTE — Telephone Encounter (Signed)
Last in 2018, Pt on Prolia q6 months. Dexa scan ordered.

## 2020-06-24 NOTE — Telephone Encounter (Signed)
Ok per Dr. Sharol Given to set up MRI order in chart tried to call pt and phone rand without answer will hold message and try again later.

## 2020-06-25 DIAGNOSIS — I70213 Atherosclerosis of native arteries of extremities with intermittent claudication, bilateral legs: Secondary | ICD-10-CM | POA: Diagnosis not present

## 2020-06-25 DIAGNOSIS — I6523 Occlusion and stenosis of bilateral carotid arteries: Secondary | ICD-10-CM | POA: Diagnosis not present

## 2020-06-29 ENCOUNTER — Telehealth: Payer: Self-pay | Admitting: Orthopedic Surgery

## 2020-06-29 NOTE — Telephone Encounter (Signed)
I called  and sw pt's son and he advised that he would like to have her hip and shoulder included in the MRI order. Advised that we are not able to do this as she has not been evaluated for these issues. The rad s/s she has been having from her LBP will be addressed with the MRI that he son was wanted to have coordinated with her MRI of her neck that was ordered by another provider? I advised that we are pending auth from her insurance company and once tht has been obtained we can see if sch can be made to arrange for the MRIs to be obtained on the same day. She has an appt on Friday and the shoulder can be addressed at that time as we have not treated her for this and to also follow up for her BKA and some pain and skin breakdown that she has experienced there.

## 2020-06-29 NOTE — Telephone Encounter (Signed)
This has been addressed see previous message.

## 2020-06-29 NOTE — Telephone Encounter (Signed)
Patient's son called.   They were told an MRI would be ordered if the pain persisted. It has persisted and she wants to proceed with the imaging. However, she already has an MRI scheduled on Sunday, September 12th. Wanted to know if the hip area could just be added.  Call back: [phone]

## 2020-07-01 ENCOUNTER — Telehealth: Payer: Self-pay | Admitting: Orthopedic Surgery

## 2020-07-01 ENCOUNTER — Other Ambulatory Visit: Payer: Self-pay | Admitting: Orthopedic Surgery

## 2020-07-01 NOTE — Telephone Encounter (Signed)
Called patient left message to return call to reschedule her appointment with Dr. Sharol Given after she have her MRI. MRI is scheduled 07/04/2020

## 2020-07-02 ENCOUNTER — Ambulatory Visit: Payer: Self-pay

## 2020-07-02 ENCOUNTER — Encounter: Payer: Self-pay | Admitting: Physician Assistant

## 2020-07-02 ENCOUNTER — Ambulatory Visit: Payer: Medicare Other | Admitting: Physician Assistant

## 2020-07-02 ENCOUNTER — Ambulatory Visit (INDEPENDENT_AMBULATORY_CARE_PROVIDER_SITE_OTHER): Payer: Medicare Other | Admitting: Physician Assistant

## 2020-07-02 VITALS — Ht 64.0 in | Wt 170.0 lb

## 2020-07-02 DIAGNOSIS — G8929 Other chronic pain: Secondary | ICD-10-CM | POA: Diagnosis not present

## 2020-07-02 DIAGNOSIS — M25511 Pain in right shoulder: Secondary | ICD-10-CM | POA: Diagnosis not present

## 2020-07-02 MED ORDER — METHYLPREDNISOLONE ACETATE 40 MG/ML IJ SUSP
40.0000 mg | INTRAMUSCULAR | Status: AC | PRN
  Administered 2020-07-02: 40 mg via INTRA_ARTICULAR

## 2020-07-02 MED ORDER — LIDOCAINE HCL 1 % IJ SOLN
5.0000 mL | INTRAMUSCULAR | Status: AC | PRN
  Administered 2020-07-02: 5 mL

## 2020-07-02 NOTE — Telephone Encounter (Signed)
Please advise 

## 2020-07-02 NOTE — Progress Notes (Signed)
Office Visit Note   Patient: [name]           Date of Birth: Feb 11, 1935           MRN: [id number] Visit Date: 07/02/2020              Requested by: Colon Branch, [address] [place],  [place] [address] PCP: Colon Branch, MD  Chief Complaint  Patient presents with   Right Shoulder - Pain      HPI: This is a pleasant woman who is currently being evaluated for her lower back pain.  She is awaiting her MRI.  She is accompanied by her son today because she continues to have right proximal arm and shoulder pain.  She thinks this was secondary to her fall.  She thinks this is a separate issue from her neck  Assessment & Plan: Visit Diagnoses:  1. Chronic right shoulder pain     Plan: Findings consistent with impingement issues.  She would like to go forward with the injection today.   Follow-Up Instructions: No follow-ups on file.   Ortho Exam  Patient is alert, oriented, no adenopathy, well-dressed, normal affect, normal respiratory effort. No obvious deformity.  She does have fairly good range of motion though with internal rotation behind her back she has pain and does not quite have as much forward elevation secondary to pain as her left side.  Tender over the [facility] joint with compression.  Imaging: No results found. No images are attached to the encounter.  Labs: Lab Results  Component Value Date   HGBA1C 6.5 06/11/2020   HGBA1C 6.3 07/11/2019   HGBA1C 6.2 02/28/2019   ESRSEDRATE 9 06/12/2014   ESRSEDRATE 12 05/13/2014   REPTSTATUS 12/27/2019 FINAL 12/24/2019   CULT (A) 12/24/2019    <10,000 COLONIES/mL INSIGNIFICANT GROWTH Performed at [facility], [place] [address], [place], [place] [address]    LABORGA KLEBSIELLA PNEUMONIAE 09/18/2016     Lab Results  Component Value Date   ALBUMIN 4.2 12/24/2019   ALBUMIN 4.2 07/11/2019   ALBUMIN 4.3 02/28/2019    Lab Results  Component Value Date   MG 1.5 03/30/2016   Lab Results    Component Value Date   VD25OH 65 10/20/2011    No results found for: PREALBUMIN CBC EXTENDED Latest Ref Rng & Units 12/24/2019 08/22/2019 02/28/2019  WBC 4.0 - 10.5 K/uL 5.0 3.7(L) 4.0  RBC 3.87 - 5.11 MIL/uL 4.22 4.07 4.06  HGB 12.0 - 15.0 g/dL 12.4 12.5 12.7  HCT 36 - 46 % 40.0 37.8 38.3  PLT 150 - 400 K/uL 160 155.0 148.0(L)  NEUTROABS 1.7 - 7.7 K/uL 3.2 2.0 2.3  LYMPHSABS 0.7 - 4.0 K/uL 1.2 1.3 1.4     Body mass index is 29.18 kg/m.  Orders:  Orders Placed This Encounter  Procedures   XR Shoulder Right   No orders of the defined types were placed in this encounter.    Procedures: Large Joint Inj: R subacromial bursa on 07/02/2020 2:10 PM Indications: diagnostic evaluation and pain Details: 22 G 1.5 in needle, posterior approach  Arthrogram: No  Medications: 5 mL lidocaine 1 %; 40 mg methylPREDNISolone acetate 40 MG/ML Outcome: tolerated well, no immediate complications Procedure, treatment alternatives, risks and benefits explained, specific risks discussed. Consent was given by the patient.      Clinical Data: No additional findings.  ROS:  All other systems negative, except as noted in the HPI.  Review of Systems  Objective: Vital Signs: Ht _0  (1.626 m)    Wt 170 lb (77.1 kg)    BMI 29.18 kg/m   Specialty Comments:  No specialty comments available.  PMFS History: Patient Active Problem List   Diagnosis Date Noted   External hemorrhoids 02/15/2018   History of three vessel coronary artery bypass - 2017 , in [facility] 09/25/2017   PVD (peripheral vascular disease) ([place])-- stent L leg 06-2017 Dr Feliberto Gottron [facility] 07/23/2017   Phantom pain ([place]) 04/01/2017   Constipation, chronic 04/01/2017   S/P unilateral BKA (below knee amputation), right ([place]) 12/14/2016   Toe osteomyelitis, left ([place]) 11/17/2016   Acute on chronic diastolic CHF (congestive heart failure), NYHA class 3 ([place]) 03/30/2016   Chest pain 03/30/2016   Coronary artery disease due to lipid  rich plaque 03/30/2016   Statin intolerance 03/30/2016   Acute renal failure superimposed on stage 3 chronic kidney disease ([place]) 03/30/2016   S/P below knee amputation ([place]) 02/18/2016   Head trauma 11/26/2015   Memory loss 11/26/2015   PCP NOTES >>>>>>>>>>>>>>>>>>>> 10/18/2015   S/P partial colectomy 01/08/2015   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 01/28/2014   Anemia 01/18/2014   Rectal bleeding 10/31/2013   CAD (coronary artery disease)    Carcinoma in situ in a polyp    Depression 09/17/2013   Charcot's joint 08/21/2013   Foot osteomyelitis, right ([place]) 07/16/2013   Chronic pain associated with significant psychosocial dysfunction 05/06/2013   Neuropathy    Hyperthyroidism    Lower extremity edema 03/08/2011   Annual physical exam 03/08/2011   ALLERGIC RHINITIS 04/15/2010   ORTHOSTATIC DIZZINESS 04/13/2010   HIATAL HERNIA 12/06/2009   GASTRIC ULCER, HX OF 12/06/2009   DYSPNEA 03/18/2009   DEGENERATIVE JOINT DISEASE 06/03/2008   COLONIC POLYPS 01/27/2008   DIVERTICULOSIS, COLON 01/27/2008   IRRITABLE BOWEL SYNDROME, HX OF 01/27/2008   Pulmonary nodule, right 01/22/2008   Hyperlipidemia 12/30/2007   Osteoporosis 12/30/2007   TB SKIN TEST, POSITIVE 08/09/2007   DM type 2 with diabetic peripheral neuropathy ([place]) 05/09/2007   Reflex sympathetic dystrophy-- PAIN MNGMT  05/09/2007   Essential hypertension 04/15/2007   GERD 04/15/2007   Past Medical History:  Diagnosis Date   Abnormal echocardiogram    09/2013: mild LVH, mild focal basal hypertrophy of septum, EF 60-65%, normal WM, grade 1 diastolic dysfunction, MAC, mild LAE, ASA, PASP 34. Possible oscillating MV density - reviewed by MD and felt there was no significant abnormality other than MAC noted with mitral valve and did not require TEE.   Adenomatous colon polyp    Allergic rhinitis    Arthritis    "back; ankles; hands; knees" (10/31/2013)   Bilateral  sensorineural hearing loss    CAD (coronary artery disease)    a. Nonobst in 2011. b. Abnormal nuc 09/2013 -> s/p cutting balloon to D2, mild LAD disease; c. 08/2015 MV: no ischemia, EF 71%.   Cancer [facility])    cancerous polyps   Carcinoma in situ in a polyp 1994   a. 1994 - malignant polyp removed during colonoscopy.   Charcot's Foot    a.  01/2016 s/p RLE transtibial amputation 2/2 Charcot rocker-bottom deformity and insensate neuropathy ulceration.   Chronic diastolic CHF (congestive heart failure) ([place])    a. 09/2013 Echo: EF 60-65%, mild LVH, Gr1 DD.   Diverticulosis    DJD (degenerative joint disease)    DVT (deep venous thrombosis) ([place]) 12/2007   Dyspnea    a. Chronic, extensive  w/u see OV note 09-2010. b. [place] 2011: ormal with RA 5 RV 31/2 PA 27/12 (19) PCW 10 CO normal. No evidence of shunting with sitting up in cath lab. c. CPX 2011: see report. d. Prior fluoro of diaphragm -  R diaphragm elevated at rest but both moved with inspiration.    Fatty liver    GERD (gastroesophageal reflux disease)    a. Hx GERD/esophageal dysmotility followed by Dr. Olevia Perches.    Head injury due to trauma    Headache    Optic migraine   Hemorrhoids    Hiatal hernia    a. s/p Nissen fundoplication 6812.   Hyperlipidemia    a. patient unwilling to use statins.   Hypertensive heart disease    IBS (irritable bowel syndrome)    Macular degeneration 03/2009   Dr. Rosana Hoes   Myocardial infarction [facility]) 2017   Neuropathy    a. Hands, feet, legs.   Orthostatic hypotension    Osteomyelitis ([facility])    a. 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 01/2016 s/p RLE transtibial amputation 2/2 Charcot rocker-bottom deformity and insensate neuropathy ulceration.   Osteoporosis    PE (pulmonary embolism) 12/2007   a. PE/DVT after neck surgery 2009. b. coumadin d/c 10-2008.   Pneumonia 2015ish   PONV (postoperative nausea and vomiting)  2009   neck surgery   PPD positive    Pulmonary nodule    incidental per CT:  Pet scan 4-9: likely benign, CT 08-2009 no change, no further CTs (Dr. Gwenette Greet)   Recurrent UTI    RSD (reflex sympathetic dystrophy)    a. Chronic pain.   Spinal stenosis    Type II diabetes mellitus ([place])    no on medication   Venous insufficiency    a. Contributing to LEE.    Family History  Problem Relation Age of Onset   Heart disease Mother        mitral valve replaced   Arthritis Mother    Diabetic kidney disease Daughter    Diabetes Father    Stroke Father    Migraines Sister    Hypertension Other    Breast cancer Other    Colon cancer Neg Hx    Esophageal cancer Neg Hx    Stomach cancer Neg Hx    Rectal cancer Neg Hx     Past Surgical History:  Procedure Laterality Date   AMPUTATION  03/06/2012   Procedure: AMPUTATION FOOT;  Surgeon: Newt Minion, MD;  Location: [facility];  Service: Orthopedics;  Laterality: Left;  FIFTH RAY AMPUTATION    AMPUTATION Right 02/18/2016   Procedure: AMPUTATION BELOW KNEE;  Surgeon: Newt Minion, MD;  Location: [facility];  Service: Orthopedics;  Laterality: Right;   AMPUTATION Left 11/22/2016   Procedure: Amputation 4th Toe Left Foot at Metatarsophalangeal Joint;  Surgeon: Newt Minion, MD;  Location: [facility];  Service: Orthopedics;  Laterality: Left;   ANKLE FUSION  09/27/2012   Procedure: ANKLE FUSION;  Surgeon: Newt Minion, MD;  Location: [facility];  Service: Orthopedics;  Laterality: Left;  Left Tibiocalcaneal Fusion   ANKLE FUSION Right 05/09/2013   Procedure: ANKLE FUSION;  Surgeon: Newt Minion, MD;  Location: [facility];  Service: Orthopedics;  Laterality: Right;  Excision Osteomyelitis Base 1st MT Right Foot, Fusion Medial Column   ANTERIOR CERVICAL DECOMP/DISCECTOMY FUSION  12/18/07   For OA,  Dr. Lorin Mercy:  fu by a PE   CARDIAC CATHETERIZATION  05/2010    at [place] Left 09/2011   CATARACT EXTRACTION W/ INTRAOCULAR  LENS  IMPLANT, BILATERAL  2004   feb 2004 left, aug 2004 right   CHOLECYSTECTOMY  03/2002   CORONARY ANGIOPLASTY  10/31/2013   CORONARY ARTERY BYPASS GRAFT  09/27/2016   CYST REMOVAL HAND  06/2003   FOOT BONE EXCISION Right 06/2009   LAPAROSCOPIC RIGHT HEMI COLECTOMY N/A 01/08/2015   Procedure: LAPAROSCOPIC ASSISTED RIGHT HEMI COLECTOMY;  Surgeon: Johnathan Hausen, MD;  Location: [facility];  Service: General;  Laterality: N/A;   LEFT HEART CATHETERIZATION WITH CORONARY ANGIOGRAM N/A 10/31/2013   Procedure: LEFT HEART CATHETERIZATION WITH CORONARY ANGIOGRAM;  Surgeon: Jettie Booze, MD;  Location[facility];  Service: Cardiovascular;  Laterality: N/A;   NASAL SEPTUM SURGERY  75/6433   NISSEN FUNDOPLICATION  12/01/5186   PERCUTANEOUS CORONARY INTERVENTION-BALLOON ONLY  10/31/2013   Procedure: PERCUTANEOUS CORONARY INTERVENTION-BALLOON ONLY;  Surgeon: Jettie Booze, MD;  Location: [facility] CATH LAB;  Service: Cardiovascular;;   ROTATOR CUFF REPAIR Right 07/2007   Dr. Morene Rankins CUFF REPAIR Left 11/2004   stent in left leg, behind knee  06/27/2017   x2, done at [facility] cardiology in [place] Right 08/2008   3rd toe, Dr. Sharol Given due to osteomyelitis   VAGINAL HYSTERECTOMY  03/1975   VEIN LIGATION Bilateral 03/1966   VENA CAVA FILTER PLACEMENT  01/2010   green filter; "due to blood clots"   WISDOM TOOTH EXTRACTION  06/2007   Social History   Occupational History   Occupation: Retired     Comment: from social services   Tobacco Use   Smoking status: Never Smoker   Smokeless tobacco: Never Used   Tobacco comment: tried for a few months in college.   Vaping Use   Vaping Use: Never used  Substance and Sexual Activity   Alcohol use: No    Alcohol/week: 0.0 standard drinks   Drug use: No   Sexual activity: Not Currently    Birth control/protection: Post-menopausal

## 2020-07-04 ENCOUNTER — Other Ambulatory Visit: Payer: Medicare Other

## 2020-07-05 ENCOUNTER — Other Ambulatory Visit: Payer: Self-pay | Admitting: Neurology

## 2020-07-05 DIAGNOSIS — G8929 Other chronic pain: Secondary | ICD-10-CM

## 2020-07-05 DIAGNOSIS — W19XXXD Unspecified fall, subsequent encounter: Secondary | ICD-10-CM

## 2020-07-05 DIAGNOSIS — M542 Cervicalgia: Secondary | ICD-10-CM

## 2020-07-05 DIAGNOSIS — R269 Unspecified abnormalities of gait and mobility: Secondary | ICD-10-CM

## 2020-07-07 ENCOUNTER — Telehealth: Payer: Self-pay | Admitting: Internal Medicine

## 2020-07-07 MED ORDER — HYDROCODONE-ACETAMINOPHEN 5-325 MG PO TABS
1.0000 | ORAL_TABLET | Freq: Four times a day (QID) | ORAL | 0 refills | Status: DC | PRN
Start: 2020-07-07 — End: 2020-08-04

## 2020-07-07 NOTE — Telephone Encounter (Signed)
Medication: HYDROcodone-acetaminophen (NORCO/VICODIN) 5-325 MG tablet [[id number]]    Has the patient contacted their pharmacy? No. (If no, request that the patient contact the pharmacy for the refill.) (If yes, when and what did the pharmacy advise?)  Preferred Pharmacy (with phone number or street name): [facility] [place], [address] [place], [place]  [address]  Phone:  [phone] Fax:  [phone]  DEA #:  [id number] Agent: Please be advised that RX refills may take up to 3 business days. We ask that you follow-up with your pharmacy.

## 2020-07-07 NOTE — Telephone Encounter (Signed)
PDMP okay, Rx sent 

## 2020-07-07 NOTE — Telephone Encounter (Signed)
Hydrocodone refill.   Last OV: 06/11/2020 Last Fill: 06/07/2020 #120 and 0RF Pt sig :1 tab q6h prn UDS: 06/11/2020 Low risk

## 2020-07-08 DIAGNOSIS — I739 Peripheral vascular disease, unspecified: Secondary | ICD-10-CM | POA: Diagnosis not present

## 2020-07-09 ENCOUNTER — Other Ambulatory Visit: Payer: Medicare Other

## 2020-07-09 DIAGNOSIS — R609 Edema, unspecified: Secondary | ICD-10-CM | POA: Diagnosis not present

## 2020-07-11 DIAGNOSIS — N200 Calculus of kidney: Secondary | ICD-10-CM | POA: Diagnosis not present

## 2020-07-11 DIAGNOSIS — N23 Unspecified renal colic: Secondary | ICD-10-CM | POA: Diagnosis not present

## 2020-07-11 DIAGNOSIS — Z885 Allergy status to narcotic agent status: Secondary | ICD-10-CM | POA: Diagnosis not present

## 2020-07-11 DIAGNOSIS — Z881 Allergy status to other antibiotic agents status: Secondary | ICD-10-CM | POA: Diagnosis not present

## 2020-07-11 DIAGNOSIS — R109 Unspecified abdominal pain: Secondary | ICD-10-CM | POA: Diagnosis not present

## 2020-07-11 DIAGNOSIS — Z79899 Other long term (current) drug therapy: Secondary | ICD-10-CM | POA: Diagnosis not present

## 2020-07-11 DIAGNOSIS — Z6829 Body mass index (BMI) 29.0-29.9, adult: Secondary | ICD-10-CM | POA: Diagnosis not present

## 2020-07-11 DIAGNOSIS — Z888 Allergy status to other drugs, medicaments and biological substances status: Secondary | ICD-10-CM | POA: Diagnosis not present

## 2020-07-13 ENCOUNTER — Ambulatory Visit: Payer: Medicare Other | Admitting: Physician Assistant

## 2020-07-15 DIAGNOSIS — R300 Dysuria: Secondary | ICD-10-CM | POA: Diagnosis not present

## 2020-07-16 DIAGNOSIS — R309 Painful micturition, unspecified: Secondary | ICD-10-CM | POA: Diagnosis not present

## 2020-07-17 ENCOUNTER — Emergency Department (HOSPITAL_COMMUNITY)
Admission: EM | Admit: 2020-07-17 | Discharge: 2020-07-18 | Disposition: A | Discharge status: Home / Self Care | Payer: Medicare Other | Attending: Emergency Medicine | Admitting: Emergency Medicine

## 2020-07-17 ENCOUNTER — Encounter (HOSPITAL_COMMUNITY): Payer: Self-pay

## 2020-07-17 ENCOUNTER — Emergency Department (HOSPITAL_COMMUNITY): Payer: Medicare Other

## 2020-07-17 DIAGNOSIS — N183 Chronic kidney disease, stage 3 unspecified: Secondary | ICD-10-CM | POA: Diagnosis not present

## 2020-07-17 DIAGNOSIS — I5033 Acute on chronic diastolic (congestive) heart failure: Secondary | ICD-10-CM | POA: Insufficient documentation

## 2020-07-17 DIAGNOSIS — R609 Edema, unspecified: Secondary | ICD-10-CM | POA: Insufficient documentation

## 2020-07-17 DIAGNOSIS — E1140 Type 2 diabetes mellitus with diabetic neuropathy, unspecified: Secondary | ICD-10-CM | POA: Diagnosis not present

## 2020-07-17 DIAGNOSIS — E785 Hyperlipidemia, unspecified: Secondary | ICD-10-CM | POA: Diagnosis present

## 2020-07-17 DIAGNOSIS — Z20822 Contact with and (suspected) exposure to covid-19: Secondary | ICD-10-CM | POA: Insufficient documentation

## 2020-07-17 DIAGNOSIS — Z951 Presence of aortocoronary bypass graft: Secondary | ICD-10-CM | POA: Insufficient documentation

## 2020-07-17 DIAGNOSIS — Z86010 Personal history of colonic polyps: Secondary | ICD-10-CM | POA: Insufficient documentation

## 2020-07-17 DIAGNOSIS — Z7901 Long term (current) use of anticoagulants: Secondary | ICD-10-CM | POA: Diagnosis not present

## 2020-07-17 DIAGNOSIS — R0789 Other chest pain: Secondary | ICD-10-CM

## 2020-07-17 DIAGNOSIS — I10 Essential (primary) hypertension: Secondary | ICD-10-CM | POA: Diagnosis present

## 2020-07-17 DIAGNOSIS — G894 Chronic pain syndrome: Secondary | ICD-10-CM | POA: Diagnosis present

## 2020-07-17 DIAGNOSIS — Z7902 Long term (current) use of antithrombotics/antiplatelets: Secondary | ICD-10-CM | POA: Diagnosis not present

## 2020-07-17 DIAGNOSIS — Z86718 Personal history of other venous thrombosis and embolism: Secondary | ICD-10-CM | POA: Insufficient documentation

## 2020-07-17 DIAGNOSIS — I2510 Atherosclerotic heart disease of native coronary artery without angina pectoris: Secondary | ICD-10-CM | POA: Diagnosis not present

## 2020-07-17 DIAGNOSIS — N289 Disorder of kidney and ureter, unspecified: Secondary | ICD-10-CM | POA: Insufficient documentation

## 2020-07-17 DIAGNOSIS — R079 Chest pain, unspecified: Secondary | ICD-10-CM | POA: Diagnosis not present

## 2020-07-17 DIAGNOSIS — Z79899 Other long term (current) drug therapy: Secondary | ICD-10-CM | POA: Insufficient documentation

## 2020-07-17 DIAGNOSIS — I130 Hypertensive heart and chronic kidney disease with heart failure and stage 1 through stage 4 chronic kidney disease, or unspecified chronic kidney disease: Secondary | ICD-10-CM | POA: Insufficient documentation

## 2020-07-17 DIAGNOSIS — E1142 Type 2 diabetes mellitus with diabetic polyneuropathy: Secondary | ICD-10-CM | POA: Diagnosis present

## 2020-07-17 DIAGNOSIS — W19XXXA Unspecified fall, initial encounter: Secondary | ICD-10-CM | POA: Diagnosis not present

## 2020-07-17 DIAGNOSIS — Y92009 Unspecified place in unspecified non-institutional (private) residence as the place of occurrence of the external cause: Secondary | ICD-10-CM | POA: Diagnosis not present

## 2020-07-17 LAB — CBC
HCT: 40.2 % (ref 36.0–46.0)
Hemoglobin: 12.6 g/dL (ref 12.0–15.0)
MCH: 29.9 pg (ref 26.0–34.0)
MCHC: 31.3 g/dL (ref 30.0–36.0)
MCV: 95.3 fL (ref 80.0–100.0)
Platelets: 140 10*3/uL — ABNORMAL LOW (ref 150–400)
RBC: 4.22 MIL/uL (ref 3.87–5.11)
RDW: 12.7 % (ref 11.5–15.5)
WBC: 6.3 10*3/uL (ref 4.0–10.5)
nRBC: 0 % (ref 0.0–0.2)

## 2020-07-17 NOTE — ED Triage Notes (Signed)
Onset 10pm left sided chest pain and shortness of breath.  Talking in complete sentences.  Denies cough, fever, leg pain.

## 2020-07-18 ENCOUNTER — Other Ambulatory Visit: Payer: Self-pay

## 2020-07-18 ENCOUNTER — Other Ambulatory Visit: Payer: Medicare Other

## 2020-07-18 ENCOUNTER — Inpatient Hospital Stay: Admission: RE | Admit: 2020-07-18 | Payer: Medicare Other | Source: Ambulatory Visit

## 2020-07-18 ENCOUNTER — Encounter (HOSPITAL_COMMUNITY): Payer: Self-pay | Admitting: Internal Medicine

## 2020-07-18 DIAGNOSIS — W19XXXA Unspecified fall, initial encounter: Secondary | ICD-10-CM | POA: Diagnosis not present

## 2020-07-18 DIAGNOSIS — I2510 Atherosclerotic heart disease of native coronary artery without angina pectoris: Secondary | ICD-10-CM

## 2020-07-18 DIAGNOSIS — R0789 Other chest pain: Secondary | ICD-10-CM | POA: Diagnosis not present

## 2020-07-18 DIAGNOSIS — E1142 Type 2 diabetes mellitus with diabetic polyneuropathy: Secondary | ICD-10-CM

## 2020-07-18 DIAGNOSIS — I10 Essential (primary) hypertension: Secondary | ICD-10-CM

## 2020-07-18 DIAGNOSIS — G894 Chronic pain syndrome: Secondary | ICD-10-CM | POA: Diagnosis not present

## 2020-07-18 DIAGNOSIS — Y92009 Unspecified place in unspecified non-institutional (private) residence as the place of occurrence of the external cause: Secondary | ICD-10-CM

## 2020-07-18 LAB — TROPONIN I (HIGH SENSITIVITY)
Troponin I (High Sensitivity): 18 ng/L — ABNORMAL HIGH (ref <18)
Troponin I (High Sensitivity): 12 ng/L (ref <18)

## 2020-07-18 LAB — BASIC METABOLIC PANEL
Anion gap: 13 (ref 5–15)
BUN: 12 mg/dL (ref 8–23)
CO2: 24 mmol/L (ref 22–32)
Calcium: 8.4 mg/dL — ABNORMAL LOW (ref 8.9–10.3)
Chloride: 101 mmol/L (ref 98–111)
Creatinine, Ser: 1.2 mg/dL — ABNORMAL HIGH (ref 0.44–1.00)
GFR calc Af Amer: 48 mL/min — ABNORMAL LOW (ref >60)
GFR calc non Af Amer: 42 mL/min — ABNORMAL LOW (ref >60)
Glucose, Bld: 303 mg/dL — ABNORMAL HIGH (ref 70–99)
Potassium: 3.7 mmol/L (ref 3.5–5.1)
Sodium: 138 mmol/L (ref 135–145)

## 2020-07-18 LAB — RESPIRATORY PANEL BY RT PCR (FLU A&B, COVID)
Influenza A by PCR: NEGATIVE
Influenza B by PCR: NEGATIVE
SARS Coronavirus 2 by RT PCR: NEGATIVE

## 2020-07-18 MED ORDER — ALUM & MAG HYDROXIDE-SIMETH 200-200-20 MG/5ML PO SUSP
30.0000 mL | Freq: Once | ORAL | Status: AC
  Administered 2020-07-18: 30 mL via ORAL
  Filled 2020-07-18: qty 30

## 2020-07-18 MED ORDER — ASPIRIN 81 MG PO CHEW
324.0000 mg | CHEWABLE_TABLET | Freq: Once | ORAL | Status: AC
  Administered 2020-07-18: 324 mg via ORAL
  Filled 2020-07-18: qty 4

## 2020-07-18 MED ORDER — ONDANSETRON HCL 4 MG/2ML IJ SOLN
4.0000 mg | Freq: Once | INTRAMUSCULAR | Status: DC
  Filled 2020-07-18: qty 2

## 2020-07-18 MED ORDER — NITROGLYCERIN 0.4 MG SL SUBL
0.4000 mg | SUBLINGUAL_TABLET | SUBLINGUAL | Status: DC | PRN
  Administered 2020-07-18: 0.4 mg via SUBLINGUAL
  Filled 2020-07-18: qty 1

## 2020-07-18 MED ORDER — PANTOPRAZOLE SODIUM 20 MG PO TBEC: 20.0000 mg | DELAYED_RELEASE_TABLET | Freq: Every day | ORAL | 1 refills | Status: DC

## 2020-07-18 NOTE — ED Provider Notes (Signed)
Dr Lorin Mercy has seen patient - recommends d/c home - to f/u with cardiology - has appt in 2 weeks, no acute cardiac findings at this time.   Noemi Chapel, MD 07/18/20 [phone]

## 2020-07-18 NOTE — ED Provider Notes (Signed)
Care signed out from Dr. Roxanne Mins at change of shift - consideration for admission - requesting consult with hospitalist.    I discussed case with Dr. Lorin Mercy who has been kind enough to see patient in consultation.   Mary Chapel, MD 07/18/20 [phone]

## 2020-07-18 NOTE — ED Provider Notes (Signed)
[facility] EMERGENCY DEPARTMENT Provider Note   CSN: [id number] Arrival date & time: 07/17/20  2246   History Chief Complaint  Patient presents with  . Chest Pain    [name] is a 85 y.o. female.  The history is provided by the patient and a relative.  Chest Pain She has history of hypertension, diabetes, hyperlipidemia, coronary artery disease status post coronary bypass, peripheral vascular disease status post stent placements and BKA, DVT status post IVC filter placement and anticoagulated on rivaroxaban who comes in because of heartburn.  Yesterday, she noted she was having some heartburn as well as some nausea.  Heartburn did radiate into her chest.  She did not vomit and she did not have any diaphoresis.  There was some mild dyspnea.  She has also had some increasing peripheral edema.  She took some baking soda which did give some temporary relief.  She is still having some nausea and heartburn.  Please note that patient was somewhat confused and majority of the history was obtained from her son.  Past Medical History:  Diagnosis Date  . Abnormal echocardiogram    09/2013: mild LVH, mild focal basal hypertrophy of septum, EF 60-65%, normal WM, grade 1 diastolic dysfunction, MAC, mild LAE, ASA, PASP 34. Possible oscillating MV density - reviewed by MD and felt there was no significant abnormality other than MAC noted with mitral valve and did not require TEE.  . Adenomatous colon polyp   . Allergic rhinitis   . Arthritis    "back; ankles; hands; knees" (10/31/2013)  . Bilateral sensorineural hearing loss   . CAD (coronary artery disease)    a. Nonobst in 2011. b. Abnormal nuc 09/2013 -> s/p cutting balloon to D2, mild LAD disease; c. 08/2015 MV: no ischemia, EF 71%.  . Cancer ([place])    cancerous polyps  . Carcinoma in situ in a polyp 1994   a. 1994 - malignant polyp removed during colonoscopy.  . Charcot's Foot    a.  01/2016 s/p RLE transtibial amputation  2/2 Charcot rocker-bottom deformity and insensate neuropathy ulceration.  . Chronic diastolic CHF (congestive heart failure) ([place])    a. 09/2013 Echo: EF 60-65%, mild LVH, Gr1 DD.  Marland Kitchen Diverticulosis   . DJD (degenerative joint disease)   . DVT (deep venous thrombosis) ([place]) 12/2007  . Dyspnea    a. Chronic, extensive w/u see OV note 09-2010. b. [place] 2011: ormal with RA 5 RV 31/2 PA 27/12 (19) PCW 10 CO normal. No evidence of shunting with sitting up in cath lab. c. CPX 2011: see report. d. Prior fluoro of diaphragm -  R diaphragm elevated at rest but both moved with inspiration.   . Fatty liver   . GERD (gastroesophageal reflux disease)    a. Hx GERD/esophageal dysmotility followed by Dr. Olevia Perches.   Marland Kitchen Head injury due to trauma   . Headache    Optic migraine  . Hemorrhoids   . Hiatal hernia    a. s/p Nissen fundoplication 2130.  Marland Kitchen Hyperlipidemia    a. patient unwilling to use statins.  . Hypertensive heart disease   . IBS (irritable bowel syndrome)   . Macular degeneration 03/2009   Dr. Rosana Hoes  . Myocardial infarction ([place]) 2017  . Neuropathy    a. Hands, feet, legs.  . Orthostatic hypotension   . Osteomyelitis ([place])    a. 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 01/2016 s/p RLE transtibial  amputation 2/2 Charcot rocker-bottom deformity and insensate neuropathy ulceration.  . Osteoporosis   . PE (pulmonary embolism) 12/2007   a. PE/DVT after neck surgery 2009. b. coumadin d/c 10-2008.  Marland Kitchen Pneumonia 2015ish  . PONV (postoperative nausea and vomiting) 2009   neck surgery  . PPD positive   . Pulmonary nodule    incidental per CT:  Pet scan 4-9: likely benign, CT 08-2009 no change, no further CTs (Dr. Gwenette Greet)  . Recurrent UTI   . RSD (reflex sympathetic dystrophy)    a. Chronic pain.  Marland Kitchen Spinal stenosis   . Type II diabetes mellitus (H[facility])    no on medication  . Venous insufficiency    a. Contributing to LEE.    Patient Active  Problem List   Diagnosis Date Noted  . External hemorrhoids 02/15/2018  . History of three vessel coronary artery bypass - 2017 , in [facility] 09/25/2017  . PVD (peripheral vascular disease) ([place])-- stent L leg 06-2017 Dr Feliberto [facility] 07/23/2017  . Phantom pain ([place]) 04/01/2017  . Constipation, chronic 04/01/2017  . S/P unilateral BKA (below knee amputation), right ([place]) 12/14/2016  . Toe osteomyelitis, left ([place]) 11/17/2016  . Acute on chronic diastolic CHF (congestive heart failure), NYHA class 3 ([place]) 03/30/2016  . Chest pain 03/30/2016  . Coronary artery disease due to lipid rich plaque 03/30/2016  . Statin intolerance 03/30/2016  . Acute renal failure superimposed on stage 3 chronic kidney disease ([place]) 03/30/2016  . S/P below knee amputation ([place]) 02/18/2016  . Head trauma 11/26/2015  . Memory loss 11/26/2015  . PCP NOTES >>>>>>>>>>>>>>>>>>>> 10/18/2015  . S/P partial colectomy 01/08/2015  . 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 Anemia 01/18/2014  . Rectal bleeding 10/31/2013  . CAD (coronary artery disease)   . Carcinoma in situ in a polyp   . Depression 09/17/2013  . Charcot's joint 08/21/2013  . Foot osteomyelitis, right ([place]) 07/16/2013  . Chronic pain associated with significant psychosocial dysfunction 05/06/2013  . Neuropathy   . Hyperthyroidism   . Lower extremity edema 03/08/2011  . Annual physical exam 03/08/2011  . ALLERGIC RHINITIS 04/15/2010  . ORTHOSTATIC DIZZINESS 04/13/2010  . HIATAL HERNIA 12/06/2009  . GASTRIC ULCER, HX OF 12/06/2009  . DYSPNEA 03/18/2009  . DEGENERATIVE JOINT DISEASE 06/03/2008  . COLONIC POLYPS 01/27/2008  . DIVERTICULOSIS, COLON 01/27/2008  . IRRITABLE BOWEL SYNDROME, HX OF 01/27/2008  . Pulmonary nodule, right 01/22/2008  . Hyperlipidemia 12/30/2007  . Osteoporosis 12/30/2007  . TB SKIN TEST, POSITIVE 08/09/2007  . DM type 2 with diabetic peripheral neuropathy ([place]) 05/09/2007  . Reflex sympathetic  dystrophy-- PAIN MNGMT  05/09/2007  . Essential hypertension 04/15/2007  . GERD 04/15/2007    Past Surgical History:  Procedure Laterality Date  . AMPUTATION  03/06/2012   Procedure: AMPUTATION FOOT;  Surgeon: Newt Minion, MD;  Location: [facility];  Service: Orthopedics;  Laterality: Left;  FIFTH RAY AMPUTATION   . AMPUTATION Right 02/18/2016   Procedure: AMPUTATION BELOW KNEE;  Surgeon: Newt Minion, MD;  Location: [facility];  Service: Orthopedics;  Laterality: Right;  . AMPUTATION Left 11/22/2016   Procedure: Amputation 4th Toe Left Foot at Metatarsophalangeal Joint;  Surgeon: Newt Minion, MD;  Location: [facility];  Service: Orthopedics;  Laterality: Left;  . ANKLE FUSION  09/27/2012   Procedure: ANKLE FUSION;  Surgeon: Newt Minion, MD;  Location: [facility];  Service: Orthopedics;  Laterality: Left;  Left Tibiocalcaneal Fusion  . ANKLE FUSION Right 05/09/2013  Procedure: ANKLE FUSION;  Surgeon: Newt Minion, MD;  Location: [facility];  Service: Orthopedics;  Laterality: Right;  Excision Osteomyelitis Base 1st MT Right Foot, Fusion Medial Column  . ANTERIOR CERVICAL DECOMP/DISCECTOMY FUSION  12/18/07   For OA,  Dr. Lorin Mercy:  fu by a PE  . CARDIAC CATHETERIZATION  05/2010    at [facility]  . CARPAL TUNNEL RELEASE Left 09/2011  . CATARACT EXTRACTION W/ INTRAOCULAR LENS  IMPLANT, BILATERAL  2004   feb 2004 left, aug 2004 right  . CHOLECYSTECTOMY  03/2002  . CORONARY ANGIOPLASTY  10/31/2013  . CORONARY ARTERY BYPASS GRAFT  09/27/2016  . CYST REMOVAL HAND  06/2003  . FOOT BONE EXCISION Right 06/2009  . LAPAROSCOPIC RIGHT HEMI COLECTOMY N/A 01/08/2015   Procedure: LAPAROSCOPIC ASSISTED RIGHT HEMI COLECTOMY;  Surgeon: Johnathan Hausen, MD;  Location: [facility];  Service: General;  Laterality: N/A;  . LEFT HEART CATHETERIZATION WITH CORONARY ANGIOGRAM N/A 10/31/2013   Procedure: LEFT HEART CATHETERIZATION WITH CORONARY ANGIOGRAM;  Surgeon: Jettie Booze, MD;  Location: [facility] CATH LAB;  Service: Cardiovascular;   Laterality: N/A;  . NASAL SEPTUM SURGERY  09/1963  . NISSEN FUNDOPLICATION  06/30/3381  . PERCUTANEOUS CORONARY INTERVENTION-BALLOON ONLY  10/31/2013   Procedure: PERCUTANEOUS CORONARY INTERVENTION-BALLOON ONLY;  Surgeon: Jettie Booze, MD;  Location: [facility] CATH LAB;  Service: Cardiovascular;;  . ROTATOR CUFF REPAIR Right 07/2007   Dr. Percell Miller  . ROTATOR CUFF REPAIR Left 11/2004  . stent in left leg, behind knee  06/27/2017   x2, done at [facility]  . TOE AMPUTATION Right 08/2008   3rd toe, Dr. Sharol Given due to osteomyelitis  . VAGINAL HYSTERECTOMY  03/1975  . VEIN LIGATION Bilateral 03/1966  . VENA CAVA FILTER PLACEMENT  01/2010   green filter; "due to blood clots"  . WISDOM TOOTH EXTRACTION  06/2007     OB History   No obstetric history on file.     Family History  Problem Relation Age of Onset  . Heart disease Mother        mitral valve replaced  . Arthritis Mother   . Diabetic kidney disease Daughter   . Diabetes Father   . Stroke Father   . Migraines Sister   . Hypertension Other   . Breast cancer Other   . Colon cancer Neg Hx   . Esophageal cancer Neg Hx   . Stomach cancer Neg Hx   . Rectal cancer Neg Hx     Social History   Tobacco Use  . Smoking status: Never Smoker  . Smokeless tobacco: Never Used  . Tobacco comment: tried for a few months in college.   Vaping Use  . Vaping Use: Never used  Substance Use Topics  . Alcohol use: No    Alcohol/week: 0.0 standard drinks  . Drug use: No    Home Medications Prior to Admission medications   Medication Sig Start Date End Date Taking? Authorizing Provider  Alpha-Lipoic Acid 600 MG CAPS Take 600 mg by mouth 2 (two) times daily.    [provider]  amitriptyline (ELAVIL) 25 MG tablet Take 1 tablet (25 mg total) by mouth at bedtime. 04/08/19   Colon Branch, MD  atorvastatin (LIPITOR) 40 MG tablet Take 40 mg by mouth daily. 12/01/18   [provider]  augmented betamethasone  dipropionate (DIPROLENE-AF) 0.05 % cream Apply topically 2 (two) times daily. 06/11/20   Colon Branch, MD  carvedilol (COREG) 25 MG tablet  Take 25 mg by mouth 2 (two) times daily with a meal.    [provider]  Cholecalciferol (VITAMIN D3) 5000 UNITS CAPS Take 5,000 Units by mouth every evening. Reported on 11/24/2015    [provider]  ciclopirox (LOPROX) 0.77 % cream Apply 1 application topically 2 (two) times daily as needed.    [provider]  clopidogrel (PLAVIX) 75 MG tablet Take 75 mg by mouth at bedtime.     [provider]  Cyanocobalamin (VITAMIN B-12) 500 MCG SUBL Place 500 mcg under the tongue daily.    [provider]  diclofenac Sodium (VOLTAREN) 1 % GEL Apply 2-4 g topically 4 (four) times daily as needed. 03/10/20   Colon Branch, MD  dicyclomine (BENTYL) 10 MG capsule Take 1 capsule (10 mg total) by mouth 3 (three) times daily before meals. 01/27/20   Mauri Pole, MD  famotidine (PEPCID) 20 MG tablet Take 1 tablet (20 mg total) by mouth daily. 12/24/19   Volanda Napoleon, PA-C  ferrous sulfate (FEROSUL) 325 (65 FE) MG tablet Take 1 tablet (325 mg total) by mouth 2 (two) times daily with a meal. 04/24/19   Colon Branch, MD  furosemide (LASIX) 20 MG tablet Take 20 mg by mouth daily. 11/09/16   [provider]  Galcanezumab-gnlm (EMGALITY) 120 MG/ML SOAJ Inject 120 mg into the skin every 30 (thirty) days. 06/14/20   Melvenia Beam, MD  glucose blood (FREESTYLE LITE) test strip USE TO CHECK BLOOD SUGAR NO MORE THAN TWICE DAILY 12/04/19   Colon Branch, MD  HYDROcodone-acetaminophen (NORCO/VICODIN) 5-325 MG tablet Take 1 tablet by mouth every 6 (six) hours as needed for moderate pain. 07/07/20   Colon Branch, MD  hydrocortisone (ANUSOL-HC) 2.5 % rectal cream Place 1 application rectally 2 (two) times daily. 11/26/19   Mauri Pole, MD  hydrocortisone (ANUSOL-HC) 25 MG suppository Place 1 suppository (25 mg total) rectally every 12  (twelve) hours. 08/20/18   Ladene Artist, MD  Lancets (FREESTYLE) lancets CHECK BLOOD SUGAR TWICE DAILY 04/08/19   Colon Branch, MD  lidocaine (LIDODERM) 5 % Place 1 patch onto the skin daily. Remove & Discard patch within 12 hours or as directed by MD 07/23/17   Colon Branch, MD  lubiprostone ([name]) 24 MCG capsule Take 1 capsule (24 mcg total) by mouth 2 (two) times daily with a meal. 11/26/19   Nandigam, Venia Minks, MD  meclizine (ANTIVERT) 25 MG tablet Take 1 tablet (25 mg total) by mouth 2 (two) times daily as needed for dizziness. 06/11/20   Colon Branch, MD  methenamine (HIPREX) 1 g tablet Take 1 tablet by mouth 2 (two) times daily. 07/30/18   [provider]  miconazole (MICOTIN) 2 % cream Apply 1 application topically 2 (two) times daily. For 4 weeks 11/26/19   Mauri Pole, MD  Multiple Vitamins-Minerals (PRESERVISION AREDS 2) CAPS Take 1 capsule by mouth 2 (two) times daily.    [provider]  nitroGLYCERIN (NITROSTAT) 0.4 MG SL tablet Place 1 tablet (0.4 mg total) under the tongue every 5 (five) minutes x 3 doses as needed for chest pain. 12/24/19   Colon Branch, MD  omeprazole (PRILOSEC) 20 MG capsule TAKE 1 CAPSULE(20 MG) BY MOUTH TWICE DAILY 09/09/19   Nandigam, Venia Minks, MD  ondansetron (ZOFRAN-ODT) 4 MG disintegrating tablet TAKE 1 TABLET BY MOUTH EVERY 8 HOURS AS NEEDED FOR NAUSEA OR VOMITING 04/01/20   Melvenia Beam,  MD  OVER THE COUNTER MEDICATION Take 1 capsule by mouth 2 (two) times daily. Integrative Digestive Formula    [provider]  OVER THE COUNTER MEDICATION 1 tablet 3 (three) times daily. Berberorine Gluco Defense     [provider]  Polyvinyl Alcohol-Povidone (REFRESH OP) Place 1 drop into both eyes 3 (three) times daily as needed (dry eyes).     [provider]  pramipexole (MIRAPEX) 0.125 MG tablet Take 1-2 tablets (0.125-0.25 mg total) by mouth at bedtime as needed. 02/17/20   Colon Branch, MD  predniSONE (DELTASONE) 10 MG  tablet TAKE 2 TABLETS(20 MG) BY MOUTH DAILY WITH BREAKFAST 07/02/20   Suzan Slick, NP  rivaroxaban (XARELTO) 2.5 MG TABS tablet Take 2.5 mg by mouth 2 (two) times daily.    [provider]    Allergies    Cymbalta [duloxetine hcl], Gabapentin, Silicone, Metformin and related, Adhesive [tape], Cefazolin, Ciprofloxacin, Clorazepate dipotassium, Dilaudid [hydromorphone hcl], Doxycycline, Enablex [darifenacin hydrobromide er], Levofloxacin, Lyrica [pregabalin], Methadone hcl, Morphine and related, and Talwin [pentazocine]  Review of Systems   Review of Systems  Cardiovascular: Positive for chest pain.  All other systems reviewed and are negative.   Physical Exam Updated Vital Signs BP (!) 146/87 (BP Location: Right Arm)   Pulse 71   Resp 18   SpO2 99%   Physical Exam Vitals and nursing note reviewed.   85 year old female, resting comfortably and in no acute distress. Vital signs are significant for mildly elevated blood pressure. Oxygen saturation is 99%, which is normal. Head is normocephalic and atraumatic. PERRLA, EOMI. Oropharynx is clear. Neck is nontender and supple without adenopathy or JVD. Back is nontender and there is no CVA tenderness. Lungs are clear without rales, wheezes, or rhonchi. Chest is nontender. Heart has regular rate and rhythm without murmur. Abdomen is soft, flat, nontender without masses or hepatosplenomegaly and peristalsis is normoactive. Extremities: Right below the knee amputation.  2+ pretibial and pedal edema on the left. Skin is warm and dry without rash. Neurologic: Mental status is normal, cranial nerves are intact, there are no motor or sensory deficits.  ED Results / Procedures / Treatments   Labs (all labs ordered are listed, but only abnormal results are displayed) Labs Reviewed  BASIC METABOLIC PANEL - Abnormal; Notable for the following components:      Result Value   Glucose, Bld 303 (*)    Creatinine, Ser 1.20 (*)     Calcium 8.4 (*)    GFR calc non Af Amer 42 (*)    GFR calc Af Amer 48 (*)    All other components within normal limits  CBC - Abnormal; Notable for the following components:   Platelets 140 (*)    All other components within normal limits  TROPONIN I (HIGH SENSITIVITY) - Abnormal; Notable for the following components:   Troponin I (High Sensitivity) 18 (*)    All other components within normal limits  TROPONIN I (HIGH SENSITIVITY)    EKG EKG Interpretation  Date/Time:  Saturday July 17 2020 22:53:44 EDT Ventricular Rate:  112 PR Interval:  148 QRS Duration: 80 QT Interval:  320 QTC Calculation: 436 R Axis:   -10 Text Interpretation: Sinus tachycardia Left ventricular hypertrophy with repolarization abnormality ( R in aVL ) Abnormal ECG When compared with ECG of 12/24/2019, No significant change was found Confirmed by Delora Fuel ([id number]) on 07/18/2020 12:26:13 AM   Radiology DG Chest 2 View  Result Date: 07/17/2020 CLINICAL DATA:  Central chest pain.  History of infarct. EXAM: CHEST - 2 VIEW COMPARISON:  07/11/2019 FINDINGS: Postoperative changes in the mediastinum. Mild cardiac enlargement. No vascular congestion, edema, or consolidation. No pleural effusions. No pneumothorax. Mediastinal contours appear intact. Degenerative changes in the spine. IVC filter in the upper abdomen. IMPRESSION: Mild cardiac enlargement. No evidence of active pulmonary disease. Electronically Signed   By: Lucienne Capers M.D.   On: 07/17/2020 23:32    Procedures Procedures  Medications Ordered in ED Medications  aspirin chewable tablet 324 mg (has no administration in time range)  nitroGLYCERIN (NITROSTAT) SL tablet 0.4 mg (has no administration in time range)    ED Course  I have reviewed the triage vital signs and the nursing notes.  Pertinent labs & imaging results that were available during my care of the patient were reviewed by me and considered in my medical decision making (see  chart for details).  MDM Rules/Calculators/A&P Epigastric and chest discomfort worrisome for angina versus ACS.  ECG does demonstrate some repolarization abnormality but this is likely secondary to LVH and is unchanged from prior ECG.  Chest x-ray shows mild cardiomegaly, no acute process.  Troponin is mildly elevated, will need to check delta troponin.  There is mild elevation of creatinine not significantly changed from baseline.  Mild thrombocytopenia is present of doubtful clinical significance.  She will be given aspirin and nitroglycerin.  Delta troponin is pending.  Old records are reviewed confirming history of coronary artery disease and peripheral vascular disease as well as DVT with IVC filter.  Repeat troponin has actually decreased.  When I went to reevaluate the patient, she was sleeping, but when awakened, still said that she was having some heartburn.  I am not confident in her history and feel would be prudent to observe her for 24 hours to make sure that this is not a cardiac event.  Case is discussed with Dr. Lorin Mercy of [facility], who agrees to come evaluate the patient.  Final Clinical Impression(s) / ED Diagnoses Final diagnoses:  Chest discomfort  Peripheral edema  Renal insufficiency    Rx / DC Orders ED Discharge Orders    None       Delora Fuel, MD 71/16/57 2237

## 2020-07-18 NOTE — Consult Note (Signed)
ER Consult Note   [name] TFT:[id number] DOB: [birth year] DOA: 07/17/2020  PCP: Colon Branch, MD Consultants:  Sharol Given - orthopedics; Jaynee Eagles - neurology; Nandigam - GI; has cardiologist in [place], [place] Patient coming from:  Home - lives with son; Donald Prose: Pandora Leiter, [phone]  Chief Complaint: CP  HPI: [name] is a 85 y.o. female with medical history significant of DM; chronic pain with reflex sympathetic dystrophy; remote PE (no longer on [facility]); PVD s/p R BKA; CAD; macular degeneration; HTN; HLD; and chronic diastolic CHF presenting with CP.  Last cath was in 2017, has had at least 2 chemical stress tests since maybe 1-2 years ago last.  Has cardiology appt on 10/15.  She fell about 2-3 weeks ago and she has had diffuse myalgias since.  She was scheduled to have 3 MRIs today by neurology - head, neck, hip.  Over the last few days, her legs have swollen.  She saw her vascular surgeon in [facility] and he wasn't too concerned.  Her right leg was too swollen to go into the prosthetic.  She also complained of nausea and heartburn - improved with Maalox.  On the way to the [facility] yesterday, she got nauseated again and she took Meclizine.  Last night, she complained of headache, nausea, heartburn.  She has had esophageal procedure and isn't able to vomit.  She did mention that she "was feeling something in her chest."   With prior MI, she had back pain; she has never previously had chest pain.  Her son was concerned about this conglomeration of symptoms and decided to bring her to the ER; she missed her 7AM appointment for outpatient MRIs.    ED Course:  CP.  H/o NSTEMI, HTN, HLD.  Vague SOB, CP, weakness.  Has negative troponins.    Review of Systems: As per HPI; otherwise review of systems reviewed and negative.   Ambulatory Status:  Ambulates with a walker when she uses her prosthetic  COVID Vaccine Status:   Complete  Past Medical History:  Diagnosis Date  . Allergic rhinitis   . Arthritis      "back; ankles; hands; knees" (10/31/2013)  . Bilateral sensorineural hearing loss   . CAD (coronary artery disease)    a. Nonobst in 2011. b. Abnormal nuc 09/2013 -> s/p cutting balloon to D2, mild LAD disease; c. 08/2015 MV: no ischemia, EF 71%.  . Carcinoma in situ in a polyp 1994   a. 1994 - malignant polyp removed during colonoscopy.  . Chronic diastolic CHF (congestive heart failure) ([place])    a. 09/2013 Echo: EF 60-65%, mild LVH, Gr1 DD.  Marland Kitchen Diverticulosis   . DJD (degenerative joint disease)   . Fatty liver   . GERD (gastroesophageal reflux disease)    a. Hx GERD/esophageal dysmotility followed by Dr. Olevia Perches.   Marland Kitchen Hemorrhoids   . Hiatal hernia    a. s/p Nissen fundoplication 1517.  Marland Kitchen Hyperlipidemia    a. patient unwilling to use statins.  . Hypertensive heart disease   . IBS (irritable bowel syndrome)   . Macular degeneration 03/2009   Dr. Rosana Hoes  . Neuropathy    a. Hands, feet, legs.  . Orthostatic hypotension   . Osteomyelitis ([place])    a. 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 01/2016 s/p RLE transtibial amputation 2/2 Charcot rocker-bottom deformity and insensate neuropathy ulceration.  . Osteoporosis   . PE (pulmonary embolism) 12/2007   a.  PE/DVT after neck surgery 2009. b. coumadin d/c 10-2008.  Marland Kitchen PONV (postoperative nausea and vomiting) 2009   neck surgery  . PPD positive   . Recurrent UTI   . RSD (reflex sympathetic dystrophy)    a. Chronic pain.  Marland Kitchen Spinal stenosis   . Type II diabetes mellitus (HCC)    no on medication  . Venous insufficiency    a. Contributing to LEE.    Past Surgical History:  Procedure Laterality Date  . AMPUTATION  03/06/2012   Procedure: AMPUTATION FOOT;  Surgeon: Newt Minion, MD;  Location: [facility];  Service: Orthopedics;  Laterality: Left;  FIFTH RAY AMPUTATION   . AMPUTATION Right 02/18/2016   Procedure: AMPUTATION BELOW KNEE;  Surgeon: Newt Minion, MD;  Location: [facility];   Service: Orthopedics;  Laterality: Right;  . AMPUTATION Left 11/22/2016   Procedure: Amputation 4th Toe Left Foot at Metatarsophalangeal Joint;  Surgeon: Newt Minion, MD;  Location: [facility];  Service: Orthopedics;  Laterality: Left;  . ANKLE FUSION  09/27/2012   Procedure: ANKLE FUSION;  Surgeon: Newt Minion, MD;  Location: [facility];  Service: Orthopedics;  Laterality: Left;  Left Tibiocalcaneal Fusion  . ANKLE FUSION Right 05/09/2013   Procedure: ANKLE FUSION;  Surgeon: Newt Minion, MD;  Location: [facility];  Service: Orthopedics;  Laterality: Right;  Excision Osteomyelitis Base 1st MT Right Foot, Fusion Medial Column  . ANTERIOR CERVICAL DECOMP/DISCECTOMY FUSION  12/18/07   For OA,  Dr. Lorin [facility]:  fu by a PE  . CARDIAC CATHETERIZATION  05/2010    at [facility]  . CARPAL TUNNEL RELEASE Left 09/2011  . CATARACT EXTRACTION W/ INTRAOCULAR LENS  IMPLANT, BILATERAL  2004   feb 2004 left, aug 2004 right  . CHOLECYSTECTOMY  03/2002  . CORONARY ANGIOPLASTY  10/31/2013  . CORONARY ARTERY BYPASS GRAFT  09/27/2016  . CYST REMOVAL HAND  06/2003  . FOOT BONE EXCISION Right 06/2009  . LAPAROSCOPIC RIGHT HEMI COLECTOMY N/A 01/08/2015   Procedure: LAPAROSCOPIC ASSISTED RIGHT HEMI COLECTOMY;  Surgeon: Johnathan Hausen, MD;  Location[facility];  Service: General;  Laterality: N/A;  . LEFT HEART CATHETERIZATION WITH CORONARY ANGIOGRAM N/A 10/31/2013   Procedure: LEFT HEART CATHETERIZATION WITH CORONARY ANGIOGRAM;  Surgeon: Jettie Booze, MD;  Location: [facility] C[facility];  Service: Cardiovascular;  Laterality: N/A;  . NASAL SEPTUM SURGERY  09/1963  . NISSEN FUNDOPLICATION  04/27/2830  . PERCUTANEOUS CORONARY INTERVENTION-BALLOON ONLY  10/31/2013   Procedure: PERCUTANEOUS CORONARY INTERVENTION-BALLOON ONLY;  Surgeon: Jettie Booze, MD;  Location: [facility] CATH LAB;  Service: Cardiovascular;;  . ROTATOR CUFF REPAIR Right 07/2007   Dr. Percell Miller  . ROTATOR CUFF REPAIR Left 11/2004  . stent in left leg, behind knee  06/27/2017   x2, done  at [facility] cardiology in [facility]  . TOE AMPUTATION Right 08/2008   3rd toe, Dr. Sharol Given due to osteomyelitis  . VAGINAL HYSTERECTOMY  03/1975  . VEIN LIGATION Bilateral 03/1966  . VENA CAVA FILTER PLACEMENT  01/2010   green filter; "due to blood clots"  . WISDOM TOOTH EXTRACTION  06/2007    Social History   Socioeconomic History  . Marital status: Divorced    Spouse name: Not on file  . Number of children: 3  . Years of education: 51  . Highest education level: Not on file  Occupational History  . Occupation: Retired     Comment: from Orthoptist   Tobacco Use  . Smoking status: Never Smoker  .  Smokeless tobacco: Never Used  . Tobacco comment: tried for a few months in college.   Vaping Use  . Vaping Use: Never used  Substance and Sexual Activity  . Alcohol use: No    Alcohol/week: 0.0 standard drinks  . Drug use: No  . Sexual activity: Not Currently    Birth control/protection: Post-menopausal  Other Topics Concern  . Not on file  Social History Narrative   Daughter lives w/ her and another daughter lives in town   1 son in [place]   Son comes home every 4 weeks to visit   Caffeine use:  Tea 1/day w/ dinner   Coffee occass          Currently lives in [place] with son [name].   Social Determinants of Health   Financial Resource Strain: Low Risk   . Difficulty of Paying Living Expenses: Not hard at all  Food Insecurity: No Food Insecurity  . Worried About Charity fundraiser in the Last Year: Never true  . Ran Out of Food in the Last Year: Never true  Transportation Needs: No Transportation Needs  . Lack of Transportation (Medical): No  . Lack of Transportation (Non-Medical): No  Physical Activity:   . Days of Exercise per Week: Not on file  . Minutes of Exercise per Session: Not on file  Stress:   . Feeling of Stress : Not on file  Social Connections:   . Frequency of Communication with Friends and Family: Not on file  . Frequency of Social Gatherings  with Friends and Family: Not on file  . Attends Religious Services: Not on file  . Active Member of Clubs or Organizations: Not on file  . Attends Archivist Meetings: Not on file  . Marital Status: Not on file  Intimate Partner Violence:   . Fear of Current or Ex-Partner: Not on file  . Emotionally Abused: Not on file  . Physically Abused: Not on file  . Sexually Abused: Not on file    Allergies  Allergen Reactions  . Cymbalta [Duloxetine Hcl] Swelling    Swelling in legs  . Gabapentin Swelling    Swelling in legs Swelling in legs  . Silicone Hives, Itching, Dermatitis and Rash  . Metformin And Related Other (See Comments)    dizzy, tired, chills, diarrhea, and nausea  . Adhesive [Tape] Rash    rash rash  . Cefazolin Hives    Hives Hives   . Ciprofloxacin Other (See Comments)    Other reaction(s): Other (See Comments) Per pt, caused body aches Per pt, caused body aches   . Clorazepate Dipotassium Other (See Comments)    Unknown reaction  . Dilaudid [Hydromorphone Hcl] Other (See Comments)     confused, intense itching  . Doxycycline Rash  . Enablex [Darifenacin Hydrobromide Er] Other (See Comments)    Hypotension, near syncope  . Levofloxacin Other (See Comments)    Causes wrist pain  . Lyrica [Pregabalin] Swelling    Swelling in legs  . Methadone Hcl Other (See Comments)    Reaction unknown  . Morphine And Related Other (See Comments)    Confusion, constipation.   Loma Messing [Pentazocine] Other (See Comments)    "climbing walls" anxiety    Family History  Problem Relation Age of Onset  . Heart disease Mother        mitral valve replaced  . Arthritis Mother   . Diabetic kidney disease Daughter   . Diabetes Father   .  Stroke Father   . Migraines Sister   . Hypertension Other   . Breast cancer Other   . Colon cancer Neg Hx   . Esophageal cancer Neg Hx   . Stomach cancer Neg Hx   . Rectal cancer Neg Hx     Prior to Admission medications     Medication Sig Start Date End Date Taking? Authorizing Provider  Alpha-Lipoic Acid 600 MG CAPS Take 600 mg by mouth 2 (two) times daily.   Yes [provider]  amitriptyline (ELAVIL) 25 MG tablet Take 1 tablet (25 mg total) by mouth at bedtime. 04/08/19  Yes Paz, Alda Berthold, MD  atorvastatin (LIPITOR) 40 MG tablet Take 40 mg by mouth daily. 12/01/18  Yes [provider]  augmented betamethasone dipropionate (DIPROLENE-AF) 0.05 % cream Apply topically 2 (two) times daily. 06/11/20  Yes Paz, Alda Berthold, MD  carvedilol (COREG) 25 MG tablet Take 25 mg by mouth 2 (two) times daily with a meal.   Yes [provider]  Cholecalciferol (VITAMIN D3) 5000 UNITS CAPS Take 5,000 Units by mouth every evening. Reported on 11/24/2015   Yes [provider]  ciclopirox (LOPROX) 0.77 % cream Apply 1 application topically 2 (two) times daily as needed.   Yes [provider]  clopidogrel (PLAVIX) 75 MG tablet Take 75 mg by mouth at bedtime.    Yes [provider]  Cyanocobalamin (VITAMIN B-12) 500 MCG SUBL Place 500 mcg under the tongue daily.   Yes [provider]  diclofenac Sodium (VOLTAREN) 1 % GEL Apply 2-4 g topically 4 (four) times daily as needed. 03/10/20  Yes Paz, Alda Berthold, MD  dicyclomine (BENTYL) 10 MG capsule Take 1 capsule (10 mg total) by mouth 3 (three) times daily before meals. 01/27/20  Yes Nandigam, Venia Minks, MD  famotidine (PEPCID) 20 MG tablet Take 1 tablet (20 mg total) by mouth daily. 12/24/19  Yes Volanda Napoleon, PA-C  ferrous sulfate (FEROSUL) 325 (65 FE) MG tablet Take 1 tablet (325 mg total) by mouth 2 (two) times daily with a meal. 04/24/19  Yes Paz, Alda Berthold, MD  furosemide (LASIX) 20 MG tablet Take 20 mg by mouth daily. 11/09/16  Yes [provider]  Galcanezumab-gnlm (EMGALITY) 120 MG/ML SOAJ Inject 120 mg into the skin every 30 (thirty) days. 06/14/20  Yes Melvenia Beam, MD  HYDROcodone-acetaminophen (NORCO/VICODIN) 5-325 MG tablet Take  1 tablet by mouth every 6 (six) hours as needed for moderate pain. 07/07/20  Yes Paz, Alda Berthold, MD  hydrocortisone (ANUSOL-HC) 2.5 % rectal cream Place 1 application rectally 2 (two) times daily. 11/26/19  Yes Nandigam, Venia Minks, MD  hydrocortisone (ANUSOL-HC) 25 MG suppository Place 1 suppository (25 mg total) rectally every 12 (twelve) hours. 08/20/18  Yes Ladene Artist, MD  lidocaine (LIDODERM) 5 % Place 1 patch onto the skin daily. Remove & Discard patch within 12 hours or as directed by MD 07/23/17  Yes Colon Branch, MD  lubiprostone ([name]) 24 MCG capsule Take 1 capsule (24 mcg total) by mouth 2 (two) times daily with a meal. 11/26/19  Yes Nandigam, Venia Minks, MD  meclizine (ANTIVERT) 25 MG tablet Take 1 tablet (25 mg total) by mouth 2 (two) times daily as needed for dizziness. 06/11/20  Yes Paz, Alda Berthold, MD  methenamine (HIPREX) 1 g tablet Take 1 tablet by mouth 2 (two) times daily. 07/30/18  Yes [provider]  Multiple Vitamins-Minerals (PRESERVISION AREDS 2) CAPS Take 1 capsule by mouth 2 (  two) times daily.   Yes [provider]  nitrofurantoin (MACRODANTIN) 100 MG capsule Take 100 mg by mouth 2 (two) times daily. 07/13/20  Yes [provider]  nitroGLYCERIN (NITROSTAT) 0.4 MG SL tablet Place 1 tablet (0.4 mg total) under the tongue every 5 (five) minutes x 3 doses as needed for chest pain. 12/24/19  Yes Paz, Alda Berthold, MD  omeprazole (PRILOSEC) 20 MG capsule TAKE 1 CAPSULE(20 MG) BY MOUTH TWICE DAILY Patient taking differently: Take 20 mg by mouth 2 (two) times daily before a meal.  09/09/19  Yes Nandigam, Kavitha V, MD  ondansetron (ZOFRAN-ODT) 4 MG disintegrating tablet TAKE 1 TABLET BY MOUTH EVERY 8 HOURS AS NEEDED FOR NAUSEA OR VOMITING Patient taking differently: Take 4 mg by mouth every 8 (eight) hours as needed for nausea or vomiting. TAKE 1 TABLET BY MOUTH EVERY 8 HOURS AS NEEDED FOR NAUSEA OR VOMITING 04/01/20  Yes Melvenia Beam, MD  OVER THE COUNTER MEDICATION  Take 1 capsule by mouth 2 (two) times daily. Integrative Digestive Formula   Yes [provider]  OVER THE COUNTER MEDICATION 1 tablet 3 (three) times daily. [place]ine Gluco Defense    Yes [provider]  Polyvinyl Alcohol-Povidone (REFRESH OP) Place 1 drop into both eyes 3 (three) times daily as needed (dry eyes).    Yes [provider]  pramipexole (MIRAPEX) 0.125 MG tablet Take 1-2 tablets (0.125-0.25 mg total) by mouth at bedtime as needed. 02/17/20  Yes Paz, Alda Berthold, MD  predniSONE (DELTASONE) 10 MG tablet TAKE 2 TABLETS(20 MG) BY MOUTH DAILY WITH BREAKFAST Patient taking differently: Take 20 mg by mouth daily with breakfast.  07/02/20  Yes Suzan Slick, NP  rivaroxaban (XARELTO) 2.5 MG TABS tablet Take 2.5 mg by mouth 2 (two) times daily.   Yes [provider]  glucose blood (FREESTYLE LITE) test strip USE TO CHECK BLOOD SUGAR NO MORE THAN TWICE DAILY 12/04/19   Colon Branch, MD  Lancets (FREESTYLE) lancets CHECK BLOOD SUGAR TWICE DAILY 04/08/19   Colon Branch, MD  miconazole (MICOTIN) 2 % cream Apply 1 application topically 2 (two) times daily. For 4 weeks Patient not taking: Reported on 07/18/2020 11/26/19   Mauri Pole, MD    Physical Exam: Vitals:   07/18/20 0408 07/18/20 0615 07/18/20 0745 07/18/20 0900  BP: (!) 146/87 (!) 161/88 (!) 149/79 (!) 147/75  Pulse: 71 100 92 87  Resp: 18 20 20 17   Temp:  98 F (36.7 C)  98 F (36.7 C)  TempSrc:  Oral  Oral  SpO2: 99% 98% 96% 97%  Weight:  70.2 kg    Height:  5\' 4"  (1.626 m)       . General:  Appears calm and comfortable and is NAD . Eyes:  PERRL, EOMI, normal lids, iris . ENT:  grossly normal hearing, lips & tongue, mmm; appropriate dentition . Neck:  no LAD, masses or thyromegaly . Cardiovascular:  RRR, no m/r/g. No LE edema.  Marland Kitchen Respiratory:   CTA bilaterally with no wheezes/rales/rhonchi.  Normal respiratory effort. . Abdomen:  soft, NT, ND, NABS . Skin:  2 small healing abrasions  distally on her R stump . Musculoskeletal:  grossly normal tone BUE/BLE, s/p R BKA, good ROM, no bony abnormality . Psychiatric:  grossly normal mood and affect, speech fluent and appropriate, AOx3 . Neurologic:  CN 2-12 grossly intact, moves all extremities in coordinated fashion    Radiological Exams on Admission: DG Chest 2 View  Result Date: 07/17/2020 CLINICAL DATA:  Central chest pain.  History of infarct. EXAM: CHEST - 2 VIEW COMPARISON:  07/11/2019 FINDINGS: Postoperative changes in the mediastinum. Mild cardiac enlargement. No vascular congestion, edema, or consolidation. No pleural effusions. No pneumothorax. Mediastinal contours appear intact. Degenerative changes in the spine. IVC filter in the upper abdomen. IMPRESSION: Mild cardiac enlargement. No evidence of active pulmonary disease. Electronically Signed   By: Lucienne Capers M.D.   On: 07/17/2020 23:32    EKG: Independently reviewed.  Sinus tachycardia with rate 112; LVH; nonspecific ST changes with no evidence of acute ischemia   Labs on Admission: I have personally reviewed the available labs and imaging studies at the time of the admission.  Pertinent labs:   Glucose 303 BUN 12/Creatinine 1.20/GFR 42 HS troponin 18, 12 WBC 6.3 Hgb 140   Assessment/Plan Active Problems:   DM type 2 with diabetic peripheral neuropathy (HCC)   Hyperlipidemia   Essential hypertension   CAD (coronary artery disease)   Chronic pain associated with significant psychosocial dysfunction   Fall at home, initial encounter   -Patient with chronic debility, s/p R BKA -She had a fall at home several weeks ago and has had persistent myalgias and discomfort since -Per chart notes, she was scheduled for MRI of her brain and C-spine and her son called to also request MRI of her hip and shoulder -She was subsequently seen on 9/10 for R shoulder pain and diagnosed with impingement issues; she received a steroid injection without relief -While  riding to [place] for the MRIs, she noticed n/v; she has h/o this issue and takes Meclizine for this -She continued to have intermittent heartburn and also some chest pain -These symptoms have since resolved -Troponin negative x 2, EKG non-ischemic -She has outpatient cardiology f/u already scheduled for mid-October -Based on lack of compelling evidence of ACS or symptoms suggestive of cardiac chest pain, it appears reasonable to d/c the patient to home -Outpatient MRIs already scheduled -Resume home medications and close outpatient f/u     Note: This patient has been tested and is negative for the novel coronavirus COVID-19.    Karmen Bongo MD [facility]   How to contact the [facility] Attending or Consulting provider [place] or covering provider during after hours [place], for this patient?  1. Check the care team in [facility] and look for a) attending/consulting [facility] provider listed and b) the [facility] team listed 2. Log into [email or web address] and use [place] universal password to access. If you do not have the password, please contact the hospital operator. 3. Locate the [facility] provider you are looking for under [facility] and page to a number that you can be directly reached. 4. If you still have difficulty reaching the provider, please page the [facility] (Director on Call) for the Hospitalists listed on amion for assistance.   07/18/2020, 10:23 AM

## 2020-07-18 NOTE — Discharge Instructions (Addendum)
Your testing has been unremarkable, please try to get to your MRI appointment this morning.  If not you can be rescheduled to have these done at a later time.  You do not have any signs of heart attack.  You have been seen by our hospitalist who agrees that you will be able to follow-up with your cardiologist at your appointment in 2 weeks.  Take acid reflux medication.  I will prescribe Protonix which you can take once a day  ER for worsening symptoms

## 2020-07-19 DIAGNOSIS — I6523 Occlusion and stenosis of bilateral carotid arteries: Secondary | ICD-10-CM | POA: Diagnosis not present

## 2020-07-19 DIAGNOSIS — I70213 Atherosclerosis of native arteries of extremities with intermittent claudication, bilateral legs: Secondary | ICD-10-CM | POA: Diagnosis not present

## 2020-07-19 DIAGNOSIS — R609 Edema, unspecified: Secondary | ICD-10-CM | POA: Diagnosis not present

## 2020-07-19 DIAGNOSIS — I739 Peripheral vascular disease, unspecified: Secondary | ICD-10-CM | POA: Diagnosis not present

## 2020-07-20 DIAGNOSIS — I25728 Atherosclerosis of autologous artery coronary artery bypass graft(s) with other forms of angina pectoris: Secondary | ICD-10-CM | POA: Diagnosis not present

## 2020-07-23 ENCOUNTER — Other Ambulatory Visit: Payer: Self-pay | Admitting: Radiology

## 2020-07-27 ENCOUNTER — Ambulatory Visit: Payer: Medicare Other | Admitting: Internal Medicine

## 2020-07-30 ENCOUNTER — Ambulatory Visit: Payer: Medicare Other | Admitting: Physician Assistant

## 2020-07-30 DIAGNOSIS — I70213 Atherosclerosis of native arteries of extremities with intermittent claudication, bilateral legs: Secondary | ICD-10-CM | POA: Diagnosis not present

## 2020-08-04 ENCOUNTER — Telehealth: Payer: Self-pay | Admitting: Neurology

## 2020-08-04 ENCOUNTER — Other Ambulatory Visit: Payer: Self-pay | Admitting: Internal Medicine

## 2020-08-04 DIAGNOSIS — R609 Edema, unspecified: Secondary | ICD-10-CM | POA: Diagnosis not present

## 2020-08-04 DIAGNOSIS — E119 Type 2 diabetes mellitus without complications: Secondary | ICD-10-CM | POA: Diagnosis not present

## 2020-08-04 DIAGNOSIS — I70213 Atherosclerosis of native arteries of extremities with intermittent claudication, bilateral legs: Secondary | ICD-10-CM | POA: Diagnosis not present

## 2020-08-04 DIAGNOSIS — I739 Peripheral vascular disease, unspecified: Secondary | ICD-10-CM | POA: Diagnosis not present

## 2020-08-04 DIAGNOSIS — E782 Mixed hyperlipidemia: Secondary | ICD-10-CM | POA: Diagnosis not present

## 2020-08-04 DIAGNOSIS — I10 Essential (primary) hypertension: Secondary | ICD-10-CM | POA: Diagnosis not present

## 2020-08-04 DIAGNOSIS — I25728 Atherosclerosis of autologous artery coronary artery bypass graft(s) with other forms of angina pectoris: Secondary | ICD-10-CM | POA: Diagnosis not present

## 2020-08-04 NOTE — Telephone Encounter (Signed)
Pt request refill ondansetron (ZOFRAN-ODT) 4 MG disintegrating tablet at [place] [phone]

## 2020-08-04 NOTE — Telephone Encounter (Signed)
I spoke with the son and discussed previously Dr Jaynee Eagles provided one refill of the Zofran and had a message on there to have pcp refill going forward. Pt's son will request refill from pcp.

## 2020-08-04 NOTE — Telephone Encounter (Signed)
Requesting: hydrocodone-acetaminophen 5-325mg  Contract: 07/20/2017 UDS: 06/11/2020 Low risk Last Visit: 06/11/2020 Next Visit: 10/19/2020 Last Refill: 07/07/2020 #120 and 0RF Pt sig: 1 tab q6h prn  Please Advise

## 2020-08-04 NOTE — Telephone Encounter (Signed)
Medication: HYDROcodone-acetaminophen (NORCO/VICODIN) 5-325 MG tablet   Has the patient contacted their pharmacy? NO (If no, request that the patient contact the pharmacy for the refill.)  Preferred Pharmacy (with phone number or street name):  [facility] [address], [address] [place] [place], [place] [address]  Phone:  [phone] Fax:  [phone]  DEA #:  [id number]   Agent: Please be advised that RX refills may take up to 3 business days. We ask that you follow-up with your pharmacy.

## 2020-08-05 DIAGNOSIS — M6281 Muscle weakness (generalized): Secondary | ICD-10-CM | POA: Diagnosis not present

## 2020-08-05 DIAGNOSIS — G894 Chronic pain syndrome: Secondary | ICD-10-CM | POA: Diagnosis not present

## 2020-08-05 DIAGNOSIS — S88111D Complete traumatic amputation at level between knee and ankle, right lower leg, subsequent encounter: Secondary | ICD-10-CM | POA: Diagnosis not present

## 2020-08-05 DIAGNOSIS — R2689 Other abnormalities of gait and mobility: Secondary | ICD-10-CM | POA: Diagnosis not present

## 2020-08-05 MED ORDER — HYDROCODONE-ACETAMINOPHEN 5-325 MG PO TABS
1.0000 | ORAL_TABLET | Freq: Four times a day (QID) | ORAL | 0 refills | Status: DC | PRN
Start: 2020-08-05 — End: 2020-09-21

## 2020-08-05 NOTE — Telephone Encounter (Signed)
PDMP okay, Rx sent 

## 2020-08-06 DIAGNOSIS — I700 Atherosclerosis of aorta: Secondary | ICD-10-CM | POA: Diagnosis not present

## 2020-08-08 DIAGNOSIS — K648 Other hemorrhoids: Secondary | ICD-10-CM | POA: Diagnosis not present

## 2020-08-09 ENCOUNTER — Telehealth: Payer: Self-pay | Admitting: Internal Medicine

## 2020-08-09 NOTE — Telephone Encounter (Signed)
Medication: ondansetron (ZOFRAN-ODT) 4 MG disintegrating tablet [[id number]]   Has the patient contacted their pharmacy? YES -Pt contacted pharmacy and ordering providers office and stated that both recommended she reach out to PCP for refills on this medication   Preferred Pharmacy (with phone number or street name):  [facility], [address] [place], [place] [address]  Phone:  [phone] Fax:  [phone]  DEA #:  [id number]  Agent: Please be advised that RX refills may take up to 3 business days. We ask that you follow-up with your pharmacy.

## 2020-08-09 NOTE — Telephone Encounter (Signed)
Pt needs to contact Dr. Jaynee Eagles (neurology) to get refills.

## 2020-08-09 NOTE — Telephone Encounter (Signed)
Hey there!!  Pt  told me she contacted Dr Jaynee Eagles and MD declined filling that script telling her she needed to reach out to Dr Larose Kells..  Please let me know if there is anything else I can do to assist further   Thanks

## 2020-08-12 ENCOUNTER — Ambulatory Visit
Admission: RE | Admit: 2020-08-12 | Discharge: 2020-08-12 | Disposition: A | Discharge status: Home / Self Care | Payer: Medicare Other | Source: Ambulatory Visit | Attending: Orthopedic Surgery | Admitting: Orthopedic Surgery

## 2020-08-12 ENCOUNTER — Ambulatory Visit
Admission: RE | Admit: 2020-08-12 | Discharge: 2020-08-12 | Disposition: A | Discharge status: Home / Self Care | Payer: Medicare Other | Source: Ambulatory Visit | Attending: Neurology | Admitting: Neurology

## 2020-08-12 DIAGNOSIS — M542 Cervicalgia: Secondary | ICD-10-CM

## 2020-08-12 DIAGNOSIS — M545 Low back pain, unspecified: Secondary | ICD-10-CM | POA: Diagnosis not present

## 2020-08-12 DIAGNOSIS — R269 Unspecified abnormalities of gait and mobility: Secondary | ICD-10-CM

## 2020-08-12 DIAGNOSIS — G8929 Other chronic pain: Secondary | ICD-10-CM | POA: Diagnosis not present

## 2020-08-12 DIAGNOSIS — M48061 Spinal stenosis, lumbar region without neurogenic claudication: Secondary | ICD-10-CM | POA: Diagnosis not present

## 2020-08-12 DIAGNOSIS — W19XXXD Unspecified fall, subsequent encounter: Secondary | ICD-10-CM

## 2020-08-12 DIAGNOSIS — M5441 Lumbago with sciatica, right side: Secondary | ICD-10-CM

## 2020-08-12 DIAGNOSIS — R519 Headache, unspecified: Secondary | ICD-10-CM | POA: Diagnosis not present

## 2020-08-16 ENCOUNTER — Other Ambulatory Visit: Payer: Self-pay | Admitting: Internal Medicine

## 2020-08-16 DIAGNOSIS — R110 Nausea: Secondary | ICD-10-CM

## 2020-08-18 ENCOUNTER — Telehealth: Payer: Self-pay | Admitting: Gastroenterology

## 2020-08-18 NOTE — Telephone Encounter (Signed)
Called back. Got the voicemail. Rx in the spring of this year had 3 additional refills at a pharmacy in [place]. If she still has refills, it possibly could be transferred to a [employer]. She would need to ask the pharmacy to do that.   Dr Silverio Decamp, if she does not have any remaining refills, is it okay to send refills to her preferred pharmacy? Thanks

## 2020-08-19 NOTE — Telephone Encounter (Addendum)
Update on the patient. I called the son to be certain the patient had what she needed. He thanks me for the call.The son has become her primary care giver though she does still have an arrangement where she is in [place] part-time and in [place] part-time. The patient was actually wanting something that was given to her at an urgent care in [place]. There are records in Care Everywhere under [facility]. She was prescribed Proctozone-HC 2.5 % topical cream perineal applicator. He also reports Ms.Delrosario complains to him that she is having a lot of rectal bleeding. She takes iron for anemia. She is on Plavix and Xarelto. He is concerned that prolonged rectal bleeding could be adversely affecting her iron storage. Patient has had the banding procedure for her hemorrhoids x 2. As far as he knows she is not passing hard stools and in fact, she sometimes has diarrhea that causes incontinence. He agrees to an appointment for the patient with Tye Savoy, NP 09/01/20 (first available) to be evaluated.

## 2020-08-19 NOTE — Telephone Encounter (Signed)
Ok to send her refills for Anusol rectal suppository bedtime PRN as needed for 7 days X 10 refills. Thanks

## 2020-08-19 NOTE — Telephone Encounter (Signed)
Unfortunately she is not a candidate for hemorrhoidal band ligation and with her dual antiplatelet therapy along with anticoagulation is at risk for recurrent hemorrhoidal bleeding and chronic blood loss. Please send referral to colorectal surgery for evaluation and management of hemorrhoids.  She will need monitoring of CBC and iron panel routinely by her PMD , if has significant iron deficiency consider referral to hematology for IV iron infusion as needed Ok to use hemorrhoidal cream or suppository as needed for no longer than 7 day continuously at a time

## 2020-08-20 ENCOUNTER — Other Ambulatory Visit: Payer: Self-pay

## 2020-08-20 DIAGNOSIS — Z961 Presence of intraocular lens: Secondary | ICD-10-CM | POA: Diagnosis not present

## 2020-08-20 DIAGNOSIS — H40053 Ocular hypertension, bilateral: Secondary | ICD-10-CM | POA: Diagnosis not present

## 2020-08-20 DIAGNOSIS — I871 Compression of vein: Secondary | ICD-10-CM | POA: Diagnosis not present

## 2020-08-20 DIAGNOSIS — H524 Presbyopia: Secondary | ICD-10-CM | POA: Diagnosis not present

## 2020-08-20 DIAGNOSIS — H18593 Other hereditary corneal dystrophies, bilateral: Secondary | ICD-10-CM | POA: Diagnosis not present

## 2020-08-20 LAB — HM DIABETES EYE EXAM

## 2020-08-20 MED ORDER — HYDROCORTISONE (PERIANAL) 2.5 % EX CREA: TOPICAL_CREAM | CUTANEOUS | 10 refills | Status: DC

## 2020-08-20 NOTE — Telephone Encounter (Signed)
Referral made to Dr Marcello Moores at [facility]. Dr Larose Kells is monitoring the CBC and iron for the patient.  How much time between using the hemorrhoidal cream or suppository should she wait before using it again? Do I cancel the appointment she has with APP on 09/01/20?

## 2020-08-20 NOTE — Telephone Encounter (Signed)
Discussed with the son. He will address this with the patient. Agrees to call back to cancel if she does not want to be seen here.

## 2020-08-20 NOTE — Telephone Encounter (Signed)
She can use steroid cream or suppository as needed but avoid using it continuously for long period.  It is better to give a break for a week or more in between. Please check with patient if she wants to come in to see [place] or wait to be seen by surgery.  Thank you

## 2020-08-24 ENCOUNTER — Other Ambulatory Visit: Payer: Self-pay

## 2020-08-24 ENCOUNTER — Telehealth: Payer: Self-pay | Admitting: Gastroenterology

## 2020-08-24 MED ORDER — HYDROCORTISONE ACETATE 25 MG RE SUPP: 25.0000 mg | Freq: Every day | RECTAL | 3 refills | Status: DC

## 2020-08-24 NOTE — Telephone Encounter (Signed)
Pt's son called to inform that rectal cream that was prescribed does not work for pt because it is hard for pt to apply it using an applicator. He stated that she needs suppository form that was previously prescribed for her. He did not remember the name but It looks like it was anusol suppository. Pls send it to [facility] on [address].

## 2020-08-24 NOTE — Telephone Encounter (Signed)
Refilled the suppositories listed on her chart.

## 2020-08-25 DIAGNOSIS — R310 Gross hematuria: Secondary | ICD-10-CM | POA: Diagnosis not present

## 2020-08-25 DIAGNOSIS — N3020 Other chronic cystitis without hematuria: Secondary | ICD-10-CM | POA: Diagnosis not present

## 2020-08-27 ENCOUNTER — Ambulatory Visit (INDEPENDENT_AMBULATORY_CARE_PROVIDER_SITE_OTHER): Payer: Medicare Other | Admitting: Medical

## 2020-08-27 ENCOUNTER — Other Ambulatory Visit: Payer: Self-pay

## 2020-08-27 ENCOUNTER — Encounter: Payer: Self-pay | Admitting: Medical

## 2020-08-27 ENCOUNTER — Ambulatory Visit (HOSPITAL_BASED_OUTPATIENT_CLINIC_OR_DEPARTMENT_OTHER)
Admission: RE | Admit: 2020-08-27 | Discharge: 2020-08-27 | Disposition: A | Discharge status: Home / Self Care | Payer: Medicare Other | Source: Ambulatory Visit | Attending: Medical | Admitting: Medical

## 2020-08-27 VITALS — BP 132/68 | HR 95 | Temp 98.4°F | Resp 12

## 2020-08-27 DIAGNOSIS — I2510 Atherosclerotic heart disease of native coronary artery without angina pectoris: Secondary | ICD-10-CM | POA: Diagnosis not present

## 2020-08-27 DIAGNOSIS — K2100 Gastro-esophageal reflux disease with esophagitis, without bleeding: Secondary | ICD-10-CM

## 2020-08-27 DIAGNOSIS — Z87440 Personal history of urinary (tract) infections: Secondary | ICD-10-CM | POA: Diagnosis not present

## 2020-08-27 DIAGNOSIS — R4182 Altered mental status, unspecified: Secondary | ICD-10-CM

## 2020-08-27 DIAGNOSIS — G8929 Other chronic pain: Secondary | ICD-10-CM | POA: Diagnosis not present

## 2020-08-27 DIAGNOSIS — M25511 Pain in right shoulder: Secondary | ICD-10-CM

## 2020-08-27 DIAGNOSIS — R0789 Other chest pain: Secondary | ICD-10-CM

## 2020-08-27 DIAGNOSIS — I517 Cardiomegaly: Secondary | ICD-10-CM | POA: Diagnosis not present

## 2020-08-27 DIAGNOSIS — Z9889 Other specified postprocedural states: Secondary | ICD-10-CM | POA: Diagnosis not present

## 2020-08-27 DIAGNOSIS — R410 Disorientation, unspecified: Secondary | ICD-10-CM

## 2020-08-27 DIAGNOSIS — R739 Hyperglycemia, unspecified: Secondary | ICD-10-CM | POA: Diagnosis not present

## 2020-08-27 DIAGNOSIS — R0781 Pleurodynia: Secondary | ICD-10-CM

## 2020-08-27 DIAGNOSIS — R319 Hematuria, unspecified: Secondary | ICD-10-CM

## 2020-08-27 DIAGNOSIS — E119 Type 2 diabetes mellitus without complications: Secondary | ICD-10-CM | POA: Diagnosis not present

## 2020-08-27 LAB — EXTRA SPECIMEN

## 2020-08-27 LAB — TROPONIN I: Troponin I: 10 ng/L (ref <=47)

## 2020-08-27 MED ORDER — FAMOTIDINE 20 MG PO TABS: 20.0000 mg | ORAL_TABLET | Freq: Two times a day (BID) | ORAL | 0 refills | Status: DC

## 2020-08-27 NOTE — Progress Notes (Signed)
Subjective:    Patient ID: [name], female    DOB: 07-May-1935, 85 y.o.   MRN: [id number]  HPI  Pt fell on Monday. She was pushing her self in wheel chair. She somehow fell from the wheel chair.   Not sure how this happened. She might have gotten wheel caught on  on something and fell out of chair.  Pt has was by herself at the time of the fall. She could not pull herself up  Pt did not hit her head.  Patient states no syncope she was aware of the whole time.  She landed on hard wood floor. No one witnessed.  Tuesday was confused mildly  Wed putting sock on wrong foot, got confused on where she was going and took a trip to see urologist and was about to go down stairs in wheel chair today.  Thursday and Friday normal mental status.    Review of Systems  Constitutional: Negative for chills, fatigue and fever.  Respiratory: Negative for cough, chest tightness and shortness of breath.   Cardiovascular: Negative for chest pain and palpitations.  Gastrointestinal: Negative for abdominal pain, anal bleeding, blood in stool, constipation and diarrhea.  Genitourinary: Negative for frequency.  Musculoskeletal: Negative for back pain, joint swelling and myalgias.       Rt sided intermitent chest pain history.  Skin: Negative for rash.  Neurological: Negative for dizziness, seizures, syncope, weakness and headaches.       Ams early in week.  Hematological: Negative for adenopathy. Does not bruise/bleed easily.  Psychiatric/Behavioral: Negative for behavioral problems, confusion and sleep disturbance. The patient is not nervous/anxious.     Past Medical History:  Diagnosis Date  . Allergic rhinitis   . Arthritis    "back; ankles; hands; knees" (10/31/2013)  . Bilateral sensorineural hearing loss   . CAD (coronary artery disease)    a. Nonobst in 2011. b. Abnormal nuc 09/2013 -> s/p cutting balloon to D2, mild LAD disease; c. 08/2015 MV: no ischemia, EF 71%.  . Carcinoma in situ  in a polyp 1994   a. 1994 - malignant polyp removed during colonoscopy.  . Chronic diastolic CHF (congestive heart failure) ([place])    a. 09/2013 Echo: EF 60-65%, mild LVH, Gr1 DD.  Marland Kitchen Diverticulosis   . DJD (degenerative joint disease)   . Fatty liver   . GERD (gastroesophageal reflux disease)    a. Hx GERD/esophageal dysmotility followed by Dr. Olevia Perches.   Marland Kitchen Hemorrhoids   . Hiatal hernia    a. s/p Nissen fundoplication 9622.  Marland Kitchen Hyperlipidemia    a. patient unwilling to use statins.  . Hypertensive heart disease   . IBS (irritable bowel syndrome)   . Macular degeneration 03/2009   Dr. Rosana Hoes  . Neuropathy    a. Hands, feet, legs.  . Orthostatic hypotension   . Osteomyelitis ([place])    a. 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 01/2016 s/p RLE transtibial amputation 2/2 Charcot rocker-bottom deformity and insensate neuropathy ulceration.  . Osteoporosis   . PE (pulmonary embolism) 12/2007   a. PE/DVT after neck surgery 2009. b. coumadin d/c 10-2008.  Marland Kitchen PONV (postoperative nausea and vomiting) 2009   neck surgery  . PPD positive   . Recurrent UTI   . RSD (reflex sympathetic dystrophy)    a. Chronic pain.  Marland Kitchen Spinal stenosis   . Type II diabetes mellitus (HCC)    no on medication  . Venous  insufficiency    a. Contributing to LEE.     Social History   Socioeconomic History  . Marital status: Divorced    Spouse name: Not on file  . Number of children: 3  . Years of education: 83  . Highest education level: Not on file  Occupational History  . Occupation: Retired     Comment: from Orth[employer]   Tobacco Use  . Smoking status: Never Smoker  . Smokeless tobacco: Never Used  . Tobacco comment: tried for a few months in college.   Vaping Use  . Vaping Use: Never used  Substance and Sexual Activity  . Alcohol use: No    Alcohol/week: 0.0 standard drinks  . Drug use: No  . Sexual activity: Not Currently    Birth  control/protection: Post-menopausal  Other Topics Concern  . Not on file  Social History Narrative   Daughter lives w/ her and another daughter lives in town   1 son in [place]   Son comes home every 4 weeks to visit   Caffeine use:  Tea 1/day w/ dinner   Coffee occass          Currently lives in [place] with son [name].   Social Determinants of Health   Financial Resource Strain: Low Risk   . Difficulty of Paying Living Expenses: Not hard at all  Food Insecurity: No Food Insecurity  . Worried About Charity fundraiser in the Last Year: Never true  . Ran Out of Food in the Last Year: Never true  Transportation Needs: No Transportation Needs  . Lack of Transportation (Medical): No  . Lack of Transportation (Non-Medical): No  Physical Activity:   . Days of Exercise per Week: Not on file  . Minutes of Exercise per Session: Not on file  Stress:   . Feeling of Stress : Not on file  Social Connections:   . Frequency of Communication with Friends and Family: Not on file  . Frequency of Social Gatherings with Friends and Family: Not on file  . Attends Religious Services: Not on file  . Active Member of Clubs or Organizations: Not on file  . Attends Archivist Meetings: Not on file  . Marital Status: Not on file  Intimate Partner Violence:   . Fear of Current or Ex-Partner: Not on file  . Emotionally Abused: Not on file  . Physically Abused: Not on file  . Sexually Abused: Not on file    Past Surgical History:  Procedure Laterality Date  . AMPUTATION  03/06/2012   Procedure: AMPUTATION FOOT;  Surgeon: Newt Minion, MD;  Location: [place];  Service: Orthopedics;  Laterality: Left;  FIFTH RAY AMPUTATION   . AMPUTATION Right 02/18/2016   Procedure: AMPUTATION BELOW KNEE;  Surgeon: Newt Minion, MD;  Location: [place];  Service: Orthopedics;  Laterality: Right;  . AMPUTATION Left 11/22/2016   Procedure: Amputation 4th Toe Left Foot at Metatarsophalangeal Joint;  Surgeon:  Newt Minion, MD;  Location: [place];  Service: Orthopedics;  Laterality: Left;  . ANKLE FUSION  09/27/2012   Procedure: ANKLE FUSION;  Surgeon: Newt Minion, MD;  Location: [place] [facility];  Service: Orthopedics;  Laterality: Left;  Left Tibiocalcaneal Fusion  . ANKLE FUSION Right 05/09/2013   Procedure: ANKLE FUSION;  Surgeon: Newt Minion, MD;  Location: [place];  Service: Orthopedics;  Laterality: Right;  Excision Osteomyelitis Base 1st MT Right Foot, Fusion Medial Column  . ANTERIOR CERVICAL DECOMP/DISCECTOMY FUSION  12/18/07   For OA,  Dr. Lorin Mercy:  fu by a PE  . CARDIAC CATHETERIZATION  05/2010    at [facility]  . CARPAL TUNNEL RELEASE Left 09/2011  . CATARACT EXTRACTION W/ INTRAOCULAR LENS  IMPLANT, BILATERAL  2004   feb 2004 left, aug 2004 right  . CHOLECYSTECTOMY  03/2002  . CORONARY ANGIOPLASTY  10/31/2013  . CORONARY ARTERY BYPASS GRAFT  09/27/2016  . CYST REMOVAL HAND  06/2003  . FOOT BONE EXCISION Right 06/2009  . LAPAROSCOPIC RIGHT HEMI COLECTOMY N/A 01/08/2015   Procedure: LAPAROSCOPIC ASSISTED RIGHT HEMI COLECTOMY;  Surgeon: Johnathan Hausen, MD;  Location: [facility];  Service: General;  Laterality: N/A;  . LEFT HEART CATHETERIZATION WITH CORONARY ANGIOGRAM N/A 10/31/2013   Procedure: LEFT HEART CATHETERIZATION WITH CORONARY ANGIOGRAM;  Surgeon: Jettie Booze, MD;  Location: [facility] C[facility] LAB;  Service: Cardiovascular;  Laterality: N/A;  . NASAL SEPTUM SURGERY  09/1963  . NISSEN FUNDOPLICATION  02/23/6269  . PERCUTANEOUS CORONARY INTERVENTION-BALLOON ONLY  10/31/2013   Procedure: PERCUTANEOUS CORONARY INTERVENTION-BALLOON ONLY;  Surgeon: Jettie Booze, MD;  Location: [facility] CATH LAB;  Service: Cardiovascular;;  . ROTATOR CUFF REPAIR Right 07/2007   Dr. Percell Miller  . ROTATOR CUFF REPAIR Left 11/2004  . stent in left leg, behind knee  06/27/2017   x2, done at [facility] cardiology in [facility]  . TOE AMPUTATION Right 08/2008   3rd toe, Dr. Sharol Given due to osteomyelitis  . VAGINAL HYSTERECTOMY  03/1975  . VEIN  LIGATION Bilateral 03/1966  . VENA CAVA FILTER PLACEMENT  01/2010   green filter; "due to blood clots"  . WISDOM TOOTH EXTRACTION  06/2007    Family History  Problem Relation Age of Onset  . Heart disease Mother        mitral valve replaced  . Arthritis Mother   . Diabetic kidney disease Daughter   . Diabetes Father   . Stroke Father   . Migraines Sister   . Hypertension Other   . Breast cancer Other   . Colon cancer Neg Hx   . Esophageal cancer Neg Hx   . Stomach cancer Neg Hx   . Rectal cancer Neg Hx     Allergies  Allergen Reactions  . Cymbalta [Duloxetine Hcl] Swelling    Swelling in legs  . Gabapentin Swelling    Swelling in legs Swelling in legs  . Silicone Hives, Itching, Dermatitis and Rash  . Metformin And Related Other (See Comments)    dizzy, tired, chills, diarrhea, and nausea  . Adhesive [Tape] Rash    rash rash  . Cefazolin Hives    Hives Hives   . Ciprofloxacin Other (See Comments)    Other reaction(s): Other (See Comments) Per pt, caused body aches Per pt, caused body aches   . Clorazepate Dipotassium Other (See Comments)    Unknown reaction  . Dilaudid [Hydromorphone Hcl] Other (See Comments)     confused, intense itching  . Doxycycline Rash  . Enablex [Darifenacin Hydrobromide Er] Other (See Comments)    Hypotension, near syncope  . Levofloxacin Other (See Comments)    Causes wrist pain  . Lyrica [Pregabalin] Swelling    Swelling in legs  . Methadone Hcl Other (See Comments)    Reaction unknown  . Morphine And Related Other (See Comments)    Confusion, constipation.   Loma Messing [Pentazocine] Other (See Comments)    "climbing walls" anxiety    Current Outpatient Medications on File Prior to Visit  Medication Sig  Dispense Refill  . Alpha-Lipoic Acid 600 MG CAPS Take 600 mg by mouth 2 (two) times daily.    Marland Kitchen amitriptyline (ELAVIL) 25 MG tablet Take 1 tablet (25 mg total) by mouth at bedtime. 90 tablet 3  . atorvastatin (LIPITOR) 40 MG  tablet Take 40 mg by mouth daily.    Marland Kitchen augmented betamethasone dipropionate (DIPROLENE-AF) 0.05 % cream Apply topically 2 (two) times daily. 60 g 0  . carvedilol (COREG) 25 MG tablet Take 25 mg by mouth 2 (two) times daily with a meal.    . Cholecalciferol (VITAMIN D3) 5000 UNITS CAPS Take 5,000 Units by mouth every evening. Reported on 11/24/2015    . ciclopirox (LOPROX) 0.77 % cream Apply 1 application topically 2 (two) times daily as needed.    . clopidogrel (PLAVIX) 75 MG tablet Take 75 mg by mouth at bedtime.     . Cyanocobalamin (VITAMIN B-12) 500 MCG SUBL Place 500 mcg under the tongue daily.    . diclofenac Sodium (VOLTAREN) 1 % GEL Apply 2-4 g topically 4 (four) times daily as needed. 100 g 5  . dicyclomine (BENTYL) 10 MG capsule Take 1 capsule (10 mg total) by mouth 3 (three) times daily before meals. 90 capsule 3  . famotidine (PEPCID) 20 MG tablet Take 1 tablet (20 mg total) by mouth daily. 10 tablet 0  . ferrous sulfate (FEROSUL) 325 (65 FE) MG tablet Take 1 tablet (325 mg total) by mouth 2 (two) times daily with a meal. 180 tablet 1  . furosemide (LASIX) 20 MG tablet Take 20 mg by mouth daily.    . Galcanezumab-gnlm (EMGALITY) 120 MG/ML SOAJ Inject 120 mg into the skin every 30 (thirty) days. 1 mL 11  . glucose blood (FREESTYLE LITE) test strip USE TO CHECK BLOOD SUGAR NO MORE THAN TWICE DAILY 200 each 12  . HYDROcodone-acetaminophen (NORCO/VICODIN) 5-325 MG tablet Take 1 tablet by mouth every 6 (six) hours as needed for moderate pain. 120 tablet 0  . hydrocortisone (ANUSOL-HC) 25 MG suppository Place 1 suppository (25 mg total) rectally at bedtime. Do not use for more than 7 days then stop for at least 7 days 24 suppository 3  . Lancets (FREESTYLE) lancets CHECK BLOOD SUGAR TWICE DAILY 200 each 12  . lidocaine (LIDODERM) 5 % Place 1 patch onto the skin daily. Remove & Discard patch within 12 hours or as directed by MD 30 patch 3  . lubiprostone ([name]) 24 MCG capsule Take 1 capsule  (24 mcg total) by mouth 2 (two) times daily with a meal. 60 capsule 2  . meclizine (ANTIVERT) 25 MG tablet Take 1 tablet (25 mg total) by mouth 2 (two) times daily as needed for dizziness. 180 tablet 0  . methenamine (HIPREX) 1 g tablet Take 1 tablet by mouth 2 (two) times daily.  4  . Multiple Vitamins-Minerals (PRESERVISION AREDS 2) CAPS Take 1 capsule by mouth 2 (two) times daily.    . nitrofurantoin (MACRODANTIN) 100 MG capsule Take 100 mg by mouth 2 (two) times daily.    . nitroGLYCERIN (NITROSTAT) 0.4 MG SL tablet Place 1 tablet (0.4 mg total) under the tongue every 5 (five) minutes x 3 doses as needed for chest pain. 25 tablet 3  . ondansetron (ZOFRAN-ODT) 4 MG disintegrating tablet Take 1 tablet (4 mg total) by mouth every 8 (eight) hours as needed for nausea or vomiting. 20 tablet 0  . OVER THE COUNTER MEDICATION Take 1 capsule by mouth 2 (two) times daily. Integrative Digestive  Formula    . OVER THE COUNTER MEDICATION 1 tablet 3 (three) times daily. Berberorine Gluco Defense     . pantoprazole (PROTONIX) 20 MG tablet Take 1 tablet (20 mg total) by mouth daily. 30 tablet 1  . Polyvinyl Alcohol-Povidone (REFRESH OP) Place 1 drop into both eyes 3 (three) times daily as needed (dry eyes).     . pramipexole (MIRAPEX) 0.125 MG tablet Take 1-2 tablets (0.125-0.25 mg total) by mouth at bedtime as needed. 180 tablet 1  . predniSONE (DELTASONE) 10 MG tablet TAKE 2 TABLETS(20 MG) BY MOUTH DAILY WITH BREAKFAST (Patient taking differently: Take 20 mg by mouth daily with breakfast. ) 60 tablet 0  . rivaroxaban (XARELTO) 2.5 MG TABS tablet Take 2.5 mg by mouth 2 (two) times daily.    . [DISCONTINUED] omeprazole (PRILOSEC) 20 MG capsule TAKE 1 CAPSULE(20 MG) BY MOUTH TWICE DAILY (Patient taking differently: Take 20 mg by mouth 2 (two) times daily before a meal. ) 180 capsule 3   No current facility-administered medications on file prior to visit.    BP 132/68 (BP Location: Right Arm, Cuff Size: Large)    Pulse 95   Temp 98.4 F (36.9 C) (Oral)   Resp 12   SpO2 97%       Objective:   Physical Exam  General Mental Status- Alert. General Appearance- Not in acute distress.   Skin General: Color- Normal Color. Moisture- Normal Moisture.  Neck Carotid Arteries- Normal color. Moisture- Normal Moisture. No carotid bruits. No JVD.  Chest and Lung Exam Auscultation: Breath Sounds:-Normal.  Cardiovascular Auscultation:Rythm- Regular. Murmurs & Other Heart Sounds:Auscultation of the heart reveals- No Murmurs.  Abdomen Inspection:-Inspeection Normal. Palpation/Percussion:Note:No mass. Palpation and Percussion of the abdomen reveal- Non Tender, Non Distended + BS, no rebound or guarding.    Neurologic Cranial Nerve exam:- CN III-XII intact(No nystagmus), symmetric smile. Drift Test:- No drift. Finger to Nose:- Normal/Intact Strength:- 5/5 equal and symmetric strength both upper and lower extremities.  Anterior chest- no chest wall pain on palpation.  Rt shoulder- faint pain on rom. No redness to skin or warmth.      Assessment & Plan:  History of UTI in the past and recent confusion.  Subjective blood in urine per patient.  Unfortunately she was unable to give urine sample today.  We did give her a sample cup to take home and want her to return the urine as soon as possible.  Unfortunately they can only bring it back on Tuesday morning.  Decided not to give any antibiotics since she does not have any overt urinary type signs and symptoms.  Also she has a long list of allergies which makes me think that the risk of antibiotics exceeds benefit.  However if obvious urinary tract signs symptoms occur over the weekend then be seen by provider where she is at.  I think they are traveling to [place].  Recent confusion has resolved after short distance of fall.  No head trauma.  No loss of conscious.  Exam of head is normal.  Normal neurologic exam.  I do not think CT of head is  indicated.  Some right sided shoulder/right humerus area pain prior to the fall.  Also some vague intermittent right upper chest wall area pain.  We will go ahead and get right rib series with chest x-ray and right shoulder x-ray.  Recent report of reflux type symptoms.  Prescribed famotidine.  Advise healthy diet over the weekend.   Family expressed concern for  cause of right side intermittent chest pain being from her heart.  Her EKG showed mild tachycardia with sinus rhythm.  No ischemic changes.  We will get 1 set of troponin stat.  Also ordered CBC and CMP to evaluate her recent transient altered mental status.  Follow-up in 6 days  Time spent with patient today was   minutes which consisted of chart revdiew, discussing diagnosis, work up treatment and documentation.  Mackie Pai, PA-C   Time spent with patient today was 40+  minutes which consisted of chart revdiew, discussing diagnosis, work up treatment and documentation.

## 2020-08-27 NOTE — Patient Instructions (Addendum)
History of UTI in the past and recent confusion.  Subjective blood in urine per patient.  Unfortunately she was unable to give urine sample today.  We did give her a sample cup to take home and want her to return the urine as soon as possible.  Unfortunately they can only bring it back on Tuesday morning.  Decided not to give any antibiotics since she does not have any overt urinary type signs and symptoms.  Also she has a long list of allergies which makes me think that the risk of antibiotics exceeds benefit.  However if obvious urinary tract signs symptoms occur over the weekend then be seen by provider where she is at.  I think they are traveling to [place].  Recent confusion has resolved after short distance of fall.  No head trauma.  No loss of conscious.  Exam of head is normal.  Normal neurologic exam.  I do not think CT of head is indicated.  Some right sided shoulder/right humerus area pain prior to the fall.  Also some vague intermittent right upper chest wall area pain.  We will go ahead and get right rib series with chest x-ray and right shoulder x-ray.  Recent report of reflux type symptoms.  Prescribed famotidine.  Advise healthy diet over the weekend.   Family expressed concern for cause of right side intermittent chest pain being from her heart.  Her EKG showed mild tachycardia with sinus rhythm.  No ischemic changes.  We will get 1 set of troponin stat.  Also ordered CBC and CMP to evaluate her recent transient altered mental status.  Follow-up in 6 days

## 2020-08-30 ENCOUNTER — Other Ambulatory Visit (INDEPENDENT_AMBULATORY_CARE_PROVIDER_SITE_OTHER): Payer: Medicare Other

## 2020-08-30 ENCOUNTER — Other Ambulatory Visit: Payer: Self-pay

## 2020-08-30 DIAGNOSIS — R4182 Altered mental status, unspecified: Secondary | ICD-10-CM

## 2020-08-30 DIAGNOSIS — R410 Disorientation, unspecified: Secondary | ICD-10-CM

## 2020-08-30 DIAGNOSIS — Z87440 Personal history of urinary (tract) infections: Secondary | ICD-10-CM | POA: Diagnosis not present

## 2020-08-30 LAB — POC URINALSYSI DIPSTICK (AUTOMATED)
Bilirubin, UA: NEGATIVE
Blood, UA: NEGATIVE
Glucose, UA: NEGATIVE
Ketones, UA: NEGATIVE
Nitrite, UA: NEGATIVE
Protein, UA: NEGATIVE
Spec Grav, UA: 1.025 (ref 1.010–1.025)
Urobilinogen, UA: 0.2 E.U./dL
pH, UA: 5 (ref 5.0–8.0)

## 2020-08-31 DIAGNOSIS — I871 Compression of vein: Secondary | ICD-10-CM | POA: Diagnosis not present

## 2020-08-31 LAB — COMPREHENSIVE METABOLIC PANEL
AG Ratio: 2 (calc) (ref 1.0–2.5)
ALT: 8 U/L (ref 6–29)
AST: 10 U/L (ref 10–35)
Albumin: 3.9 g/dL (ref 3.6–5.1)
Alkaline phosphatase (APISO): 54 U/L (ref 37–153)
BUN/Creatinine Ratio: 19 (calc) (ref 6–22)
BUN: 17 mg/dL (ref 7–25)
CO2: 29 mmol/L (ref 20–32)
Calcium: 8.9 mg/dL (ref 8.6–10.4)
Chloride: 103 mmol/L (ref 98–110)
Creat: 0.89 mg/dL — ABNORMAL HIGH (ref 0.60–0.88)
Globulin: 2 g/dL (calc) (ref 1.9–3.7)
Glucose, Bld: 227 mg/dL — ABNORMAL HIGH (ref 65–99)
Potassium: 4.6 mmol/L (ref 3.5–5.3)
Sodium: 141 mmol/L (ref 135–146)
Total Bilirubin: 0.6 mg/dL (ref 0.2–1.2)
Total Protein: 5.9 g/dL — ABNORMAL LOW (ref 6.1–8.1)

## 2020-08-31 LAB — TEST AUTHORIZATION

## 2020-08-31 LAB — CBC WITH DIFFERENTIAL/PLATELET
Absolute Monocytes: 169 cells/uL — ABNORMAL LOW (ref 200–950)
Basophils Absolute: 13 cells/uL (ref 0–200)
Basophils Relative: 0.2 %
Eosinophils Absolute: 39 cells/uL (ref 15–500)
Eosinophils Relative: 0.6 %
HCT: 37.6 % (ref 35.0–45.0)
Hemoglobin: 12.4 g/dL (ref 11.7–15.5)
Lymphs Abs: 696 cells/uL — ABNORMAL LOW (ref 850–3900)
MCH: 30.3 pg (ref 27.0–33.0)
MCHC: 33 g/dL (ref 32.0–36.0)
MCV: 91.9 fL (ref 80.0–100.0)
MPV: 9.5 fL (ref 7.5–12.5)
Monocytes Relative: 2.6 %
Neutro Abs: 5584 cells/uL (ref 1500–7800)
Neutrophils Relative %: 85.9 %
Platelets: 179 10*3/uL (ref 140–400)
RBC: 4.09 10*6/uL (ref 3.80–5.10)
RDW: 13.6 % (ref 11.0–15.0)
Total Lymphocyte: 10.7 %
WBC: 6.5 10*3/uL (ref 3.8–10.8)

## 2020-08-31 LAB — HEMOGLOBIN A1C W/OUT EAG: Hgb A1c MFr Bld: 8.1 % of total Hgb — ABNORMAL HIGH (ref <5.7)

## 2020-09-01 ENCOUNTER — Ambulatory Visit: Payer: Medicare Other | Admitting: Nurse Practitioner

## 2020-09-01 ENCOUNTER — Telehealth: Payer: Self-pay | Admitting: Medical

## 2020-09-01 LAB — URINE CULTURE
MICRO NUMBER:: 11173093
SPECIMEN QUALITY:: ADEQUATE

## 2020-09-01 MED ORDER — NITROFURANTOIN MONOHYD MACRO 100 MG PO CAPS: 100.0000 mg | ORAL_CAPSULE | Freq: Two times a day (BID) | ORAL | 0 refills | Status: DC

## 2020-09-01 NOTE — Telephone Encounter (Signed)
Rx macrobid sent to pt pharmacy.

## 2020-09-03 ENCOUNTER — Encounter: Payer: Self-pay | Admitting: Internal Medicine

## 2020-09-03 DIAGNOSIS — I10 Essential (primary) hypertension: Secondary | ICD-10-CM | POA: Diagnosis not present

## 2020-09-03 DIAGNOSIS — E119 Type 2 diabetes mellitus without complications: Secondary | ICD-10-CM | POA: Diagnosis not present

## 2020-09-03 DIAGNOSIS — I5022 Chronic systolic (congestive) heart failure: Secondary | ICD-10-CM | POA: Diagnosis not present

## 2020-09-03 DIAGNOSIS — M79662 Pain in left lower leg: Secondary | ICD-10-CM | POA: Diagnosis not present

## 2020-09-03 DIAGNOSIS — E782 Mixed hyperlipidemia: Secondary | ICD-10-CM | POA: Diagnosis not present

## 2020-09-03 DIAGNOSIS — I70208 Unspecified atherosclerosis of native arteries of extremities, other extremity: Secondary | ICD-10-CM | POA: Diagnosis not present

## 2020-09-03 LAB — HEPATIC FUNCTION PANEL
ALT: 8 (ref 7–35)
AST: 10 — AB (ref 13–35)
Alkaline Phosphatase: 61 (ref 25–125)
Bilirubin, Total: 0.8

## 2020-09-03 LAB — BASIC METABOLIC PANEL
BUN: 17 (ref 4–21)
CO2: 26 — AB (ref 13–22)
Chloride: 103 (ref 99–108)
Creatinine: 1 (ref 0.5–1.1)
Glucose: 146
Potassium: 4 (ref 3.4–5.3)
Sodium: 140 (ref 137–147)

## 2020-09-03 LAB — COMPREHENSIVE METABOLIC PANEL
Albumin: 3.8 (ref 3.5–5.0)
Calcium: 8.1 — AB (ref 8.7–10.7)
GFR calc Af Amer: 17
GFR calc non Af Amer: 60
Globulin: 1.9

## 2020-09-03 LAB — CBC AND DIFFERENTIAL
HCT: 39 (ref 36–46)
Hemoglobin: 12.9 (ref 12.0–16.0)
Neutrophils Absolute: 3456
Platelets: 158 (ref 150–399)
WBC: 5.4

## 2020-09-03 LAB — CBC: RBC: 4.29 (ref 3.87–5.11)

## 2020-09-08 DIAGNOSIS — M79662 Pain in left lower leg: Secondary | ICD-10-CM | POA: Diagnosis not present

## 2020-09-08 DIAGNOSIS — I872 Venous insufficiency (chronic) (peripheral): Secondary | ICD-10-CM | POA: Diagnosis not present

## 2020-09-12 DIAGNOSIS — M79601 Pain in right arm: Secondary | ICD-10-CM | POA: Diagnosis not present

## 2020-09-12 DIAGNOSIS — Z885 Allergy status to narcotic agent status: Secondary | ICD-10-CM | POA: Diagnosis not present

## 2020-09-12 DIAGNOSIS — Z881 Allergy status to other antibiotic agents status: Secondary | ICD-10-CM | POA: Diagnosis not present

## 2020-09-12 DIAGNOSIS — Z79899 Other long term (current) drug therapy: Secondary | ICD-10-CM | POA: Diagnosis not present

## 2020-09-12 DIAGNOSIS — Z888 Allergy status to other drugs, medicaments and biological substances status: Secondary | ICD-10-CM | POA: Diagnosis not present

## 2020-09-12 DIAGNOSIS — Z7901 Long term (current) use of anticoagulants: Secondary | ICD-10-CM | POA: Diagnosis not present

## 2020-09-12 DIAGNOSIS — R410 Disorientation, unspecified: Secondary | ICD-10-CM | POA: Diagnosis not present

## 2020-09-12 DIAGNOSIS — R911 Solitary pulmonary nodule: Secondary | ICD-10-CM | POA: Diagnosis not present

## 2020-09-12 DIAGNOSIS — R918 Other nonspecific abnormal finding of lung field: Secondary | ICD-10-CM | POA: Diagnosis not present

## 2020-09-12 DIAGNOSIS — Z89511 Acquired absence of right leg below knee: Secondary | ICD-10-CM | POA: Diagnosis not present

## 2020-09-12 DIAGNOSIS — R4182 Altered mental status, unspecified: Secondary | ICD-10-CM | POA: Diagnosis not present

## 2020-09-13 ENCOUNTER — Telehealth: Payer: Self-pay

## 2020-09-13 ENCOUNTER — Ambulatory Visit: Payer: Medicare Other | Admitting: Orthopedic Surgery

## 2020-09-13 NOTE — Telephone Encounter (Signed)
Nurse Assessment Nurse: Caryn Section, RN, [place]ch Penny Date/Time Eilene Ghazi Time): 09/12/2020 2:28:42 PM Confirm and document reason for call. If symptomatic, describe symptoms. ---Caller states that his mother is having right arm and shoulder pain which started 3-4 months ago. Reports the pain is getting worse. She takes hydrocodone but the caller is wanting to know if a muscle relaxer would help. Rates the pain as 10/10. No fever, vomiting or diarrhea. Decreased PO fluid intake but normal urination. Reports numbness and tingling in right arm. No chest pain or SOB. Pt has been having agitation and confusion for the past week. Does the patient have any new or worsening symptoms? ---Yes Will a triage be completed? ---Yes Related visit to physician within the last 2 weeks? ---No Does the PT have any chronic conditions? (i.e. diabetes, asthma, this includes High risk factors for pregnancy, etc.) ---Yes List chronic conditions. ---HTN, DM, anxiety, CAD with bypass and stents, chronic UTI, pt is on Plavix and Xarelto Is this a behavioral health or substance abuse call? ---No Guidelines Guideline Title Affirmed Question Affirmed Notes Nurse Date/Time (Eastern Time) Confusion - Delirium Very strange or paranoid behavior Caryn Section, RN, Butch Penny 09/12/2020 2:36:21 PM PLEASE NOTE: All timestamps contained within this report are represented as [place] Standard Time. CONFIDENTIALTY NOTICE: This fax transmission is intended only for the addressee. It contains information that is legally privileged, confidential or otherwise protected from use or disclosure. If you are not the intended recipient, you are strictly prohibited from reviewing, disclosing, copying using or disseminating any of this information or taking any action in reliance on or regarding this information. If you have received this fax in error, please notify us immediately by telephone so that we can arrange for its return to [place]. Phone: [phone],  Toll-Free: [phone], Fax: [phone] Page: 2 of 2 Call Id: [id number] [place]. Time Eilene Ghazi Time) Disposition Final User 09/12/2020 2:26:34 PM Send to Urgent Queue Vinnie Level 09/12/2020 2:39:59 PM Go to ED Now Yes Caryn Section, RN, [place] Disagree/Comply Comply Caller Understands Yes PreDisposition Go to Urgent Care/Walk-In Clinic Care Advice Given Per Guideline GO TO ED NOW: * You need to be seen in the Emergency Department. * Leave now. Drive carefully. ANOTHER ADULT SHOULD DRIVE: * It is better and safer if another adult drives instead of you. BRING MEDICINES: * Bring a list of your current medicines when you go to the Emergency Department (ER). * Bring the pill bottles too. This will help the doctor (or NP/PA) to make certain you are taking the right medicines and the right dose. CARE ADVICE given per Confusion-Delirium (Adult) guideline. Referrals GO TO FACILITY UNDECIDED

## 2020-09-14 ENCOUNTER — Ambulatory Visit: Payer: Medicare Other | Admitting: Family Medicine

## 2020-09-14 NOTE — Patient Instructions (Incomplete)
Below is our plan:  We will continue Emgality every 30 days and amitriptyline 25mg  daily at bedtime.   Please make sure you are staying well hydrated. I recommend 50-60 ounces daily. Well balanced diet and regular exercise encouraged. Fall precautions advised.   Please continue follow up with care team as directed.   Follow up in 1 year, sooner if needed   You may receive a survey regarding today's visit. I encourage you to leave honest feed back as I do use this information to improve patient care. Thank you for seeing me today!      Fall Prevention in the Home, Adult Falls can cause injuries. They can happen to people of all ages. There are many things you can do to make your home safe and to help prevent falls. Ask for help when making these changes, if needed. What actions can I take to prevent falls? General Instructions  Use good lighting in all rooms. Replace any light bulbs that burn out.  Turn on the lights when you go into a dark area. Use night-lights.  Keep items that you use often in easy-to-reach places. Lower the shelves around your home if necessary.  Set up your furniture so you have a clear path. Avoid moving your furniture around.  Do not have throw rugs and other things on the floor that can make you trip.  Avoid walking on wet floors.  If any of your floors are uneven, fix them.  Add color or contrast paint or tape to clearly mark and help you see: ? Any grab bars or handrails. ? First and last steps of stairways. ? Where the edge of each step is.  If you use a stepladder: ? Make sure that it is fully opened. Do not climb a closed stepladder. ? Make sure that both sides of the stepladder are locked into place. ? Ask someone to hold the stepladder for you while you use it.  If there are any pets around you, be aware of where they are. What can I do in the bathroom?      Keep the floor dry. Clean up any water that spills onto the floor as soon as  it happens.  Remove soap buildup in the tub or shower regularly.  Use non-skid mats or decals on the floor of the tub or shower.  Attach bath mats securely with double-sided, non-slip rug tape.  If you need to sit down in the shower, use a plastic, non-slip stool.  Install grab bars by the toilet and in the tub and shower. Do not use towel bars as grab bars. What can I do in the bedroom?  Make sure that you have a light by your bed that is easy to reach.  Do not use any sheets or blankets that are too big for your bed. They should not hang down onto the floor.  Have a firm chair that has side arms. You can use this for support while you get dressed. What can I do in the kitchen?  Clean up any spills right away.  If you need to reach something above you, use a strong step stool that has a grab bar.  Keep electrical cords out of the way.  Do not use floor polish or wax that makes floors slippery. If you must use wax, use non-skid floor wax. What can I do with my stairs?  Do not leave any items on the stairs.  Make sure that you have a light  switch at the top of the stairs and the bottom of the stairs. If you do not have them, ask someone to add them for you.  Make sure that there are handrails on both sides of the stairs, and use them. Fix handrails that are broken or loose. Make sure that handrails are as long as the stairways.  Install non-slip stair treads on all stairs in your home.  Avoid having throw rugs at the top or bottom of the stairs. If you do have throw rugs, attach them to the floor with carpet tape.  Choose a carpet that does not hide the edge of the steps on the stairway.  Check any carpeting to make sure that it is firmly attached to the stairs. Fix any carpet that is loose or worn. What can I do on the outside of my home?  Use bright outdoor lighting.  Regularly fix the edges of walkways and driveways and fix any cracks.  Remove anything that might  make you trip as you walk through a door, such as a raised step or threshold.  Trim any bushes or trees on the path to your home.  Regularly check to see if handrails are loose or broken. Make sure that both sides of any steps have handrails.  Install guardrails along the edges of any raised decks and porches.  Clear walking paths of anything that might make someone trip, such as tools or rocks.  Have any leaves, snow, or ice cleared regularly.  Use sand or salt on walking paths during winter.  Clean up any spills in your garage right away. This includes grease or oil spills. What other actions can I take?  Wear shoes that: ? Have a low heel. Do not wear high heels. ? Have rubber bottoms. ? Are comfortable and fit you well. ? Are closed at the toe. Do not wear open-toe sandals.  Use tools that help you move around (mobility aids) if they are needed. These include: ? Canes. ? Walkers. ? Scooters. ? Crutches.  Review your medicines with your doctor. Some medicines can make you feel dizzy. This can increase your chance of falling. Ask your doctor what other things you can do to help prevent falls. Where to find more information  Centers for Disease Control and Prevention, [facility]: https://garcia.biz/  [employer] on Aging: BrainJudge.co.uk Contact a doctor if:  You are afraid of falling at home.  You feel weak, drowsy, or dizzy at home.  You fall at home. Summary  There are many simple things that you can do to make your home safe and to help prevent falls.  Ways to make your home safe include removing tripping hazards and installing grab bars in the bathroom.  Ask for help when making these changes in your home. This information is not intended to replace advice given to you by your health care provider. Make sure you discuss any questions you have with your health care provider. Document Revised: 01/30/2019 Document Reviewed: 05/24/2017 Elsevier Patient  Education  [place].   Migraine Headache A migraine headache is a very strong throbbing pain on one side or both sides of your head. This type of headache can also cause other symptoms. It can last from 4 hours to 3 days. Talk with your doctor about what things may bring on (trigger) this condition. What are the causes? The exact cause of this condition is not known. This condition may be triggered or caused by:  Drinking alcohol.  Smoking.  Taking medicines, such as: ? Medicine used to treat chest pain (nitroglycerin). ? Birth control pills. ? Estrogen. ? Some blood pressure medicines.  Eating or drinking certain products.  Doing physical activity. Other things that may trigger a migraine headache include:  Having a menstrual period.  Pregnancy.  Hunger.  Stress.  Not getting enough sleep or getting too much sleep.  Weather changes.  Tiredness (fatigue). What increases the risk?  Being 13-26 years old.  Being female.  Having a family history of migraine headaches.  Being Caucasian.  Having depression or anxiety.  Being very overweight. What are the signs or symptoms?  A throbbing pain. This pain may: ? Happen in any area of the head, such as on one side or both sides. ? Make it hard to do daily activities. ? Get worse with physical activity. ? Get worse around bright lights or loud noises.  Other symptoms may include: ? Feeling sick to your stomach (nauseous). ? Vomiting. ? Dizziness. ? Being sensitive to bright lights, loud noises, or smells.  Before you get a migraine headache, you may get warning signs (an aura). An aura may include: ? Seeing flashing lights or having blind spots. ? Seeing bright spots, halos, or zigzag lines. ? Having tunnel vision or blurred vision. ? Having numbness or a tingling feeling. ? Having trouble talking. ? Having weak muscles.  Some people have symptoms after a migraine headache (postdromal phase), such  as: ? Tiredness. ? Trouble thinking (concentrating). How is this treated?  Taking medicines that: ? Relieve pain. ? Relieve the feeling of being sick to your stomach. ? Prevent migraine headaches.  Treatment may also include: ? Having acupuncture. ? Avoiding foods that bring on migraine headaches. ? Learning ways to control your body functions (biofeedback). ? Therapy to help you know and deal with negative thoughts (cognitive behavioral therapy). Follow these instructions at home: Medicines  Take over-the-counter and prescription medicines only as told by your doctor.  Ask your doctor if the medicine prescribed to you: ? Requires you to avoid driving or using heavy machinery. ? Can cause trouble pooping (constipation). You may need to take these steps to prevent or treat trouble pooping:  Drink enough fluid to keep your pee (urine) pale yellow.  Take over-the-counter or prescription medicines.  Eat foods that are high in fiber. These include beans, whole grains, and fresh fruits and vegetables.  Limit foods that are high in fat and sugar. These include fried or sweet foods. Lifestyle  Do not drink alcohol.  Do not use any products that contain nicotine or tobacco, such as cigarettes, e-cigarettes, and chewing tobacco. If you need help quitting, ask your doctor.  Get at least 8 hours of sleep every night.  Limit and deal with stress. General instructions      Keep a journal to find out what may bring on your migraine headaches. For example, write down: ? What you eat and drink. ? How much sleep you get. ? Any change in what you eat or drink. ? Any change in your medicines.  If you have a migraine headache: ? Avoid things that make your symptoms worse, such as bright lights. ? It may help to lie down in a dark, quiet room. ? Do not drive or use heavy machinery. ? Ask your doctor what activities are safe for you.  Keep all follow-up visits as told by your  doctor. This is important. Contact a doctor if:  You get a migraine headache that  is different or worse than others you have had.  You have more than 15 headache days in one month. Get help right away if:  Your migraine headache gets very bad.  Your migraine headache lasts longer than 72 hours.  You have a fever.  You have a stiff neck.  You have trouble seeing.  Your muscles feel weak or like you cannot control them.  You start to lose your balance a lot.  You start to have trouble walking.  You pass out (faint).  You have a seizure. Summary  A migraine headache is a very strong throbbing pain on one side or both sides of your head. These headaches can also cause other symptoms.  This condition may be treated with medicines and changes to your lifestyle.  Keep a journal to find out what may bring on your migraine headaches.  Contact a doctor if you get a migraine headache that is different or worse than others you have had.  Contact your doctor if you have more than 15 headache days in a month. This information is not intended to replace advice given to you by your health care provider. Make sure you discuss any questions you have with your health care provider. Document Revised: 01/31/2019 Document Reviewed: 11/21/2018 Elsevier Patient Education  [place].

## 2020-09-14 NOTE — Progress Notes (Deleted)
No chief complaint on file.    HISTORY OF PRESENT ILLNESS: Today 09/14/20  [name] is a 85 y.o. female here today for follow up for migraines. She was seen by Dr Jaynee Eagles last in 05/2020 for reevaluation of chronic migraines and falls. MRI brian and cervical spine ordered but not performed. She was started on Emgality. She has also continued amitriptyline 25mg  at bedtime.    HISTORY (copied from previous note)  HPI:  [name] is a 85 y.o. female here as requested by Newt Minion, MD for neck pain.  Patient is well-known to [place] [facility] and we have seen her for multiple conditions.  She has a very complicated and extensive history including a past medical history of chronic migraines, spinal stenosis, reflex sympathetic dystrophy, osteoporosis, orthostatic hypotension, polyneuropathy, hypertension, hyperlipidemia, falls, head injury due to trauma, DVTs, degenerative joint disease, chronic diastolic congestive heart failure, coronary artery disease, hearing loss, arthritis, chronic pain, peripheral vascular disease, diabetes, depression, lower extremity edema, partial colectomy, memory loss, below the knee amputation.   Patient today is here with her son who also provides much information.  Patient has a long history of multiple chronic pain disorders of which she we have evaluated and treated her for chronic migraines migraines, chronic dizziness and vertigo, headaches, chronic neck pain, multiple falls, ocular migraines.  Today she is here stating she still having headaches and migraines, she has chronic neck and back pain, she has tried heat, massage, dry needling in her shoulders in the past, she also reports that she still having falls however talking to her son it appears that she bent over to pick up a dog bed which she really should not have been doing giving her multiple risk factors for falls, we have tried multiple medications for her migraines, and patient currently has  multiple medications on her list polypharmacy.  I had a long discussion with her and her son today, at this time I do not feel as though we should put another oral medication on her list we can try Emgality for her and I did inject her in the office today for her migraines.  Patient complaining of falls again at this time I told patient she has multiple risk factors for falls and she cannot do things like bend over and pick up dog beds that puts her more at risk of falls, there is nothing that we can do at this point to mitigate her fall risk except have her not do things that we will inevitably lead to her losing her balance.   REVIEW OF SYSTEMS: Out of a complete 14 system review of symptoms, the patient complains only of the following symptoms, and all other reviewed systems are negative.   ALLERGIES: Allergies  Allergen Reactions  . Cymbalta [Duloxetine Hcl] Swelling    Swelling in legs  . Gabapentin Swelling    Swelling in legs Swelling in legs  . Silicone Hives, Itching, Dermatitis and Rash  . Metformin And Related Other (See Comments)    dizzy, tired, chills, diarrhea, and nausea  . Adhesive [Tape] Rash    rash rash  . Cefazolin Hives    Hives Hives   . Ciprofloxacin Other (See Comments)    Other reaction(s): Other (See Comments) Per pt, caused body aches Per pt, caused body aches   . Clorazepate Dipotassium Other (See Comments)    Unknown reaction  . Dilaudid [Hydromorphone Hcl] Other (See Comments)     confused, intense itching  .  Doxycycline Rash  . Enablex [Darifenacin Hydrobromide Er] Other (See Comments)    Hypotension, near syncope  . Levofloxacin Other (See Comments)    Causes wrist pain  . Lyrica [Pregabalin] Swelling    Swelling in legs  . Methadone Hcl Other (See Comments)    Reaction unknown  . Morphine And Related Other (See Comments)    Confusion, constipation.   Loma Messing [Pentazocine] Other (See Comments)    "climbing walls" anxiety     HOME  MEDICATIONS: Outpatient Medications Prior to Visit  Medication Sig Dispense Refill  . Alpha-Lipoic Acid 600 MG CAPS Take 600 mg by mouth 2 (two) times daily.    Marland Kitchen amitriptyline (ELAVIL) 25 MG tablet Take 1 tablet (25 mg total) by mouth at bedtime. 90 tablet 3  . atorvastatin (LIPITOR) 40 MG tablet Take 40 mg by mouth daily.    Marland Kitchen augmented betamethasone dipropionate (DIPROLENE-AF) 0.05 % cream Apply topically 2 (two) times daily. 60 g 0  . carvedilol (COREG) 25 MG tablet Take 25 mg by mouth 2 (two) times daily with a meal.    . Cholecalciferol (VITAMIN D3) 5000 UNITS CAPS Take 5,000 Units by mouth every evening. Reported on 11/24/2015    . ciclopirox (LOPROX) 0.77 % cream Apply 1 application topically 2 (two) times daily as needed.    . clopidogrel (PLAVIX) 75 MG tablet Take 75 mg by mouth at bedtime.     . Cyanocobalamin (VITAMIN B-12) 500 MCG SUBL Place 500 mcg under the tongue daily.    . diclofenac Sodium (VOLTAREN) 1 % GEL Apply 2-4 g topically 4 (four) times daily as needed. 100 g 5  . dicyclomine (BENTYL) 10 MG capsule Take 1 capsule (10 mg total) by mouth 3 (three) times daily before meals. 90 capsule 3  . famotidine (PEPCID) 20 MG tablet Take 1 tablet (20 mg total) by mouth daily. 10 tablet 0  . famotidine (PEPCID) 20 MG tablet Take 1 tablet (20 mg total) by mouth 2 (two) times daily. 60 tablet 0  . ferrous sulfate (FEROSUL) 325 (65 FE) MG tablet Take 1 tablet (325 mg total) by mouth 2 (two) times daily with a meal. 180 tablet 1  . furosemide (LASIX) 20 MG tablet Take 20 mg by mouth daily.    . Galcanezumab-gnlm (EMGALITY) 120 MG/ML SOAJ Inject 120 mg into the skin every 30 (thirty) days. 1 mL 11  . glucose blood (FREESTYLE LITE) test strip USE TO CHECK BLOOD SUGAR NO MORE THAN TWICE DAILY 200 each 12  . HYDROcodone-acetaminophen (NORCO/VICODIN) 5-325 MG tablet Take 1 tablet by mouth every 6 (six) hours as needed for moderate pain. 120 tablet 0  . hydrocortisone (ANUSOL-HC) 25 MG  suppository Place 1 suppository (25 mg total) rectally at bedtime. Do not use for more than 7 days then stop for at least 7 days 24 suppository 3  . Lancets (FREESTYLE) lancets CHECK BLOOD SUGAR TWICE DAILY 200 each 12  . lidocaine (LIDODERM) 5 % Place 1 patch onto the skin daily. Remove & Discard patch within 12 hours or as directed by MD 30 patch 3  . lubiprostone ([name]) 24 MCG capsule Take 1 capsule (24 mcg total) by mouth 2 (two) times daily with a meal. 60 capsule 2  . meclizine (ANTIVERT) 25 MG tablet Take 1 tablet (25 mg total) by mouth 2 (two) times daily as needed for dizziness. 180 tablet 0  . methenamine (HIPREX) 1 g tablet Take 1 tablet by mouth 2 (two) times daily.  4  .  Multiple Vitamins-Minerals (PRESERVISION AREDS 2) CAPS Take 1 capsule by mouth 2 (two) times daily.    . nitrofurantoin (MACRODANTIN) 100 MG capsule Take 100 mg by mouth 2 (two) times daily.    . nitrofurantoin, macrocrystal-monohydrate, (MACROBID) 100 MG capsule Take 1 capsule (100 mg total) by mouth 2 (two) times daily. 14 capsule 0  . nitroGLYCERIN (NITROSTAT) 0.4 MG SL tablet Place 1 tablet (0.4 mg total) under the tongue every 5 (five) minutes x 3 doses as needed for chest pain. 25 tablet 3  . ondansetron (ZOFRAN-ODT) 4 MG disintegrating tablet Take 1 tablet (4 mg total) by mouth every 8 (eight) hours as needed for nausea or vomiting. 20 tablet 0  . OVER THE COUNTER MEDICATION Take 1 capsule by mouth 2 (two) times daily. Integrative Digestive Formula    . OVER THE COUNTER MEDICATION 1 tablet 3 (three) times daily. Berberorine Gluco Defense     . pantoprazole (PROTONIX) 20 MG tablet Take 1 tablet (20 mg total) by mouth daily. 30 tablet 1  . Polyvinyl Alcohol-Povidone (REFRESH OP) Place 1 drop into both eyes 3 (three) times daily as needed (dry eyes).     . pramipexole (MIRAPEX) 0.125 MG tablet Take 1-2 tablets (0.125-0.25 mg total) by mouth at bedtime as needed. 180 tablet 1  . predniSONE (DELTASONE) 10 MG tablet  TAKE 2 TABLETS(20 MG) BY MOUTH DAILY WITH BREAKFAST (Patient taking differently: Take 20 mg by mouth daily with breakfast. ) 60 tablet 0  . rivaroxaban (XARELTO) 2.5 MG TABS tablet Take 2.5 mg by mouth 2 (two) times daily.     No facility-administered medications prior to visit.     PAST MEDICAL HISTORY: Past Medical History:  Diagnosis Date  . Allergic rhinitis   . Arthritis    "back; ankles; hands; knees" (10/31/2013)  . Bilateral sensorineural hearing loss   . CAD (coronary artery disease)    a. Nonobst in 2011. b. Abnormal nuc 09/2013 -> s/p cutting balloon to D2, mild LAD disease; c. 08/2015 MV: no ischemia, EF 71%.  . Carcinoma in situ in a polyp 1994   a. 1994 - malignant polyp removed during colonoscopy.  . Chronic diastolic CHF (congestive heart failure) ([place])    a. 09/2013 Echo: EF 60-65%, mild LVH, Gr1 DD.  Marland Kitchen Diverticulosis   . DJD (degenerative joint disease)   . Fatty liver   . GERD (gastroesophageal reflux disease)    a. Hx GERD/esophageal dysmotility followed by Dr. Olevia Perches.   Marland Kitchen Hemorrhoids   . Hiatal hernia    a. s/p Nissen fundoplication 6440.  Marland Kitchen Hyperlipidemia    a. patient unwilling to use statins.  . Hypertensive heart disease   . IBS (irritable bowel syndrome)   . Macular degeneration 03/2009   Dr. Rosana Hoes  . Neuropathy    a. Hands, feet, legs.  . Orthostatic hypotension   . Osteomyelitis ([place])    a. 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 01/2016 s/p RLE transtibial amputation 2/2 Charcot rocker-bottom deformity and insensate neuropathy ulceration.  . Osteoporosis   . PE (pulmonary embolism) 12/2007   a. PE/DVT after neck surgery 2009. b. coumadin d/c 10-2008.  Marland Kitchen PONV (postoperative nausea and vomiting) 2009   neck surgery  . PPD positive   . Recurrent UTI   . RSD (reflex sympathetic dystrophy)    a. Chronic pain.  Marland Kitchen Spinal stenosis   . Type II diabetes mellitus (H[facility])    no on medication  .  Venous insufficiency    a. Contributing to LEE.     PAST SURGICAL HISTORY: Past Surgical History:  Procedure Laterality Date  . AMPUTATION  03/06/2012   Procedure: AMPUTATION FOOT;  Surgeon: Newt Minion, MD;  Location: [facility];  Service: Orthopedics;  Laterality: Left;  FIFTH RAY AMPUTATION   . AMPUTATION Right 02/18/2016   Procedure: AMPUTATION BELOW KNEE;  Surgeon: Newt Minion, MD;  Location: [facility];  Service: Orthopedics;  Laterality: Right;  . AMPUTATION Left 11/22/2016   Procedure: Amputation 4th Toe Left Foot at Metatarsophalangeal Joint;  Surgeon: Newt Minion, MD;  Location: [facility];  Service: Orthopedics;  Laterality: Left;  . ANKLE FUSION  09/27/2012   Procedure: ANKLE FUSION;  Surgeon: Newt Minion, MD;  Location: [facility];  Service: Orthopedics;  Laterality: Left;  Left Tibiocalcaneal Fusion  . ANKLE FUSION Right 05/09/2013   Procedure: ANKLE FUSION;  Surgeon: Newt Minion, MD;  Location: [facility];  Service: Orthopedics;  Laterality: Right;  Excision Osteomyelitis Base 1st MT Right Foot, Fusion Medial Column  . ANTERIOR CERVICAL DECOMP/DISCECTOMY FUSION  12/18/07   For OA,  Dr. Lorin Mercy:  fu by a PE  . CARDIAC CATHETERIZATION  05/2010    at [facility]  . CARPAL TUNNEL RELEASE Left 09/2011  . CATARACT EXTRACTION W/ INTRAOCULAR LENS  IMPLANT, BILATERAL  2004   feb 2004 left, aug 2004 right  . CHOLECYSTECTOMY  03/2002  . CORONARY ANGIOPLASTY  10/31/2013  . CORONARY ARTERY BYPASS GRAFT  09/27/2016  . CYST REMOVAL HAND  06/2003  . FOOT BONE EXCISION Right 06/2009  . LAPAROSCOPIC RIGHT HEMI COLECTOMY N/A 01/08/2015   Procedure: LAPAROSCOPIC ASSISTED RIGHT HEMI COLECTOMY;  Surgeon: Johnathan Hausen, MD;  Location: [facility];  Service: General;  Laterality: N/A;  . LEFT HEART CATHETERIZATION WITH CORONARY ANGIOGRAM N/A 10/31/2013   Procedure: LEFT HEART CATHETERIZATION WITH CORONARY ANGIOGRAM;  Surgeon: Jettie Booze, MD;  Location: [facility] C[facility];  Service: Cardiovascular;  Laterality: N/A;  . NASAL  SEPTUM SURGERY  09/1963  . NISSEN FUNDOPLICATION  7/0/0174  . PERCUTANEOUS CORONARY INTERVENTION-BALLOON ONLY  10/31/2013   Procedure: PERCUTANEOUS CORONARY INTERVENTION-BALLOON ONLY;  Surgeon: Jettie Booze, MD;  Location: [facility] CATH [facility];  Service: Cardiovascular;;  . ROTATOR CUFF REPAIR Right 07/2007   Dr. Percell Miller  . ROTATOR CUFF REPAIR Left 11/2004  . stent in left leg, behind knee  06/27/2017   x2, done at [facility] in [facility]  . TOE AMPUTATION Right 08/2008   3rd toe, Dr. Sharol Given due to osteomyelitis  . VAGINAL HYSTERECTOMY  03/1975  . VEIN LIGATION Bilateral 03/1966  . VENA CAVA FILTER PLACEMENT  01/2010   green filter; "due to blood clots"  . WISDOM TOOTH EXTRACTION  06/2007     FAMILY HISTORY: Family History  Problem Relation Age of Onset  . Heart disease Mother        mitral valve replaced  . Arthritis Mother   . Diabetic kidney disease Daughter   . Diabetes Father   . Stroke Father   . Migraines Sister   . Hypertension Other   . Breast cancer Other   . Colon cancer Neg Hx   . Esophageal cancer Neg Hx   . Stomach cancer Neg Hx   . Rectal cancer Neg Hx      SOCIAL HISTORY: Social History   Socioeconomic History  . Marital status: Divorced    Spouse name: Not on file  . Number of children: 3  .  Years of education: 38  . Highest education level: Not on file  Occupational History  . Occupation: Retired     Comment: from Orthoptist   Tobacco Use  . Smoking status: Never Smoker  . Smokeless tobacco: Never Used  . Tobacco comment: tried for a few months in college.   Vaping Use  . Vaping Use: Never used  Substance and Sexual Activity  . Alcohol use: No    Alcohol/week: 0.0 standard drinks  . Drug use: No  . Sexual activity: Not Currently    Birth control/protection: Post-menopausal  Other Topics Concern  . Not on file  Social History Narrative   Daughter lives w/ her and another daughter lives in town   1 son in [place]   Son comes home  every 4 weeks to visit   Caffeine use:  Tea 1/day w/ dinner   Coffee occass          Currently lives in [place] with son Karista Aispuro.   Social Determinants of Health   Financial Resource Strain: Low Risk   . Difficulty of Paying Living Expenses: Not hard at all  Food Insecurity: No Food Insecurity  . Worried About Charity fundraiser in the Last Year: Never true  . Ran Out of Food in the Last Year: Never true  Transportation Needs: No Transportation Needs  . Lack of Transportation (Medical): No  . Lack of Transportation (Non-Medical): No  Physical Activity:   . Days of Exercise per Week: Not on file  . Minutes of Exercise per Session: Not on file  Stress:   . Feeling of Stress : Not on file  Social Connections:   . Frequency of Communication with Friends and Family: Not on file  . Frequency of Social Gatherings with Friends and Family: Not on file  . Attends Religious Services: Not on file  . Active Member of Clubs or Organizations: Not on file  . Attends Archivist Meetings: Not on file  . Marital Status: Not on file  Intimate Partner Violence:   . Fear of Current or Ex-Partner: Not on file  . Emotionally Abused: Not on file  . Physically Abused: Not on file  . Sexually Abused: Not on file      PHYSICAL EXAM  There were no vitals filed for this visit. There is no height or weight on file to calculate BMI.   Generalized: Well developed, in no acute distress  Cardiology: normal rate and rhythm, no murmur auscultated  Respiratory: clear to auscultation bilaterally    Neurological examination  Mentation: Alert oriented to time, place, history taking. Follows all commands speech and language fluent Cranial nerve II-XII: Pupils were equal round reactive to light. Extraocular movements were full, visual field were full on confrontational test. Facial sensation and strength were normal. Uvula tongue midline. Head turning and shoulder shrug  were normal and  symmetric. Motor: The motor testing reveals 5 over 5 strength of all 4 extremities. Good symmetric motor tone is noted throughout.  Sensory: Sensory testing is intact to soft touch on all 4 extremities. No evidence of extinction is noted.  Coordination: Cerebellar testing reveals good finger-nose-finger and heel-to-shin bilaterally.  Gait and station: Gait is normal. Tandem gait is normal. Romberg is negative. No drift is seen.  Reflexes: Deep tendon reflexes are symmetric and normal bilaterally.     DIAGNOSTIC DATA (LABS, IMAGING, TESTING) - I reviewed patient records, labs, notes, testing and imaging myself where available.  Lab Results  Component Value Date   WBC 5.4 09/03/2020   HGB 12.9 09/03/2020   HCT 39 09/03/2020   MCV 91.9 08/27/2020   PLT 158 09/03/2020      Component Value Date/Time   NA 140 09/03/2020 0000   K 4.0 09/03/2020 0000   CL 103 09/03/2020 0000   CO2 26 (A) 09/03/2020 0000   GLUCOSE 227 (H) 08/27/2020 1634   BUN 17 09/03/2020 0000   CREATININE 1.0 09/03/2020 0000   CREATININE 0.89 (H) 08/27/2020 1634   CALCIUM 8.1 (A) 09/03/2020 0000   PROT 5.9 (L) 08/27/2020 1634   ALBUMIN 3.8 09/03/2020 0000   AST 10 (A) 09/03/2020 0000   ALT 8 09/03/2020 0000   ALKPHOS 61 09/03/2020 0000   BILITOT 0.6 08/27/2020 1634   GFRNONAA 60 09/03/2020 0000   GFRAA 17 09/03/2020 0000   Lab Results  Component Value Date   CHOL 115 06/11/2020   HDL 44.20 06/11/2020   LDLCALC 42 06/11/2020   LDLDIRECT 44.0 02/28/2019   TRIG 144.0 06/11/2020   CHOLHDL 3 06/11/2020   Lab Results  Component Value Date   HGBA1C 8.1 (H) 08/27/2020   Lab Results  Component Value Date   VITAMINB12 >1500 pg/mL (H) 09/10/2009   Lab Results  Component Value Date   TSH 1.44 09/20/2016      ASSESSMENT AND PLAN  85 y.o. year old female  has a past medical history of Allergic rhinitis, Arthritis, Bilateral sensorineural hearing loss, CAD (coronary artery disease), Carcinoma in situ in a  polyp (1994), Chronic diastolic CHF (congestive heart failure) ([place]), Diverticulosis, DJD (degenerative joint disease), Fatty liver, GERD (gastroesophageal reflux disease), Hemorrhoids, Hiatal hernia, Hyperlipidemia, Hypertensive heart disease, IBS (irritable bowel syndrome), Macular degeneration (03/2009), Neuropathy, Orthostatic hypotension, Osteomyelitis ([place]), Osteoporosis, PE (pulmonary embolism) (12/2007), PONV (postoperative nausea and vomiting) (2009), PPD positive, Recurrent UTI, RSD (reflex sympathetic dystrophy), Spinal stenosis, Type II diabetes mellitus ([place]), and Venous insufficiency. here with   No diagnosis found.   I spent 20 minutes of face-to-face and non-face-to-face time with patient.  This included previsit chart review, lab review, study review, order entry, electronic health record documentation, patient education.    Debbora Presto, MSN, FNP-C 09/14/2020, 7:55 AM  [facility] [address], [place], [place] [address] [phone]

## 2020-09-20 ENCOUNTER — Telehealth: Payer: Self-pay | Admitting: Internal Medicine

## 2020-09-20 NOTE — Telephone Encounter (Signed)
Patient states she received a prescription on HYDROcodone-acetaminophen (NORCO/VICODIN) 5-325 MG tablet  Patient received only 15 tablets, patient normally gets it for 120 tablets   Please Advise

## 2020-09-21 DIAGNOSIS — K625 Hemorrhage of anus and rectum: Secondary | ICD-10-CM | POA: Diagnosis not present

## 2020-09-21 DIAGNOSIS — R319 Hematuria, unspecified: Secondary | ICD-10-CM | POA: Diagnosis not present

## 2020-09-21 MED ORDER — HYDROCODONE-ACETAMINOPHEN 5-325 MG PO TABS: 1.0000 | ORAL_TABLET | Freq: Four times a day (QID) | ORAL | 0 refills | Status: DC | PRN

## 2020-09-21 NOTE — Telephone Encounter (Signed)
Pt is in [place] and would medication sent to :  [facility] [place], [address] AT [address] Phone:  [phone]  Fax:  [phone]

## 2020-09-21 NOTE — Telephone Encounter (Signed)
PDMP okay, another provider (in [facility] where the pt lives part time) sent a small amount of hydrocodone. Okay to refill, prescription sent.

## 2020-09-21 NOTE — Telephone Encounter (Signed)
Last Rx sent on 08/05/2020 #120 and 0RF. Assuming the pharmacy gave her #15 tablets to get through weekend(?).   Requesting: hydrocodone 5/325mg  Contract: 07/20/2017 UDS: 06/11/2020 Last Visit: 08/27/2020 w/ Percell Miller Next Visit: 09/22/2020 Last Refill: 08/05/2020 #120 and 0RF Pt sig: 1 tab q6h prn  Please Advise

## 2020-09-22 ENCOUNTER — Encounter: Payer: Self-pay | Admitting: Internal Medicine

## 2020-09-22 ENCOUNTER — Other Ambulatory Visit: Payer: Self-pay

## 2020-09-22 ENCOUNTER — Ambulatory Visit (INDEPENDENT_AMBULATORY_CARE_PROVIDER_SITE_OTHER): Payer: Medicare Other | Admitting: Internal Medicine

## 2020-09-22 ENCOUNTER — Ambulatory Visit (HOSPITAL_BASED_OUTPATIENT_CLINIC_OR_DEPARTMENT_OTHER)
Admission: RE | Admit: 2020-09-22 | Discharge: 2020-09-22 | Disposition: A | Discharge status: Home / Self Care | Payer: Medicare Other | Source: Ambulatory Visit | Attending: Internal Medicine | Admitting: Internal Medicine

## 2020-09-22 VITALS — BP 144/78 | HR 110 | Temp 98.8°F | Ht 64.0 in

## 2020-09-22 DIAGNOSIS — M542 Cervicalgia: Secondary | ICD-10-CM

## 2020-09-22 DIAGNOSIS — R079 Chest pain, unspecified: Secondary | ICD-10-CM | POA: Diagnosis not present

## 2020-09-22 DIAGNOSIS — I2510 Atherosclerotic heart disease of native coronary artery without angina pectoris: Secondary | ICD-10-CM | POA: Diagnosis not present

## 2020-09-22 DIAGNOSIS — R21 Rash and other nonspecific skin eruption: Secondary | ICD-10-CM

## 2020-09-22 DIAGNOSIS — F4321 Adjustment disorder with depressed mood: Secondary | ICD-10-CM

## 2020-09-22 DIAGNOSIS — Z23 Encounter for immunization: Secondary | ICD-10-CM

## 2020-09-22 DIAGNOSIS — I517 Cardiomegaly: Secondary | ICD-10-CM | POA: Diagnosis not present

## 2020-09-22 MED ORDER — KETOCONAZOLE 2 % EX CREA: 1.0000 "application " | TOPICAL_CREAM | Freq: Every day | CUTANEOUS | 0 refills | Status: DC

## 2020-09-22 NOTE — Patient Instructions (Addendum)
Apply Nizoral to your chest twice a day  If not better in the next 2 to 3 weeks, please see your dermatologist    [place], [place] back for a checkup in 2 weeks  STOP BY THE FIRST FLOOR:  get the XR

## 2020-09-22 NOTE — Progress Notes (Signed)
Subjective:    Patient ID: [name], female    DOB: 03/24/1935, 85 y.o.   MRN: [id number]  DOS:  09/22/2020 Type of visit - description: Acute, here with her son Dellis Filbert.  Multiple issues discussed.  Her main concern is few weeks history of pain. According to the patient the painstarts on the right wrist and work its way up to the base of the R neck. The pain is worse when she moves her head. I asked about arms motor deficits  and she said "probably". Denies any fall.  She also reports pain at the right lateral chest and breast.  I asked her if the breast itself is hurting and she said "I do not know, I think is all related to my arm"  I asked  about a rash and she told me that the rash she was seen for on 05-2020 is not completely well and still itches. [id number] she was seen at the office, she had a the upper chest bilaterally right shoulder.  At the time I prescribed topical steroids.).  Lastly, unfortunately she lost her daughter [name] on Thanksgiving day.  She also lost her sister back in the summer.  Review of Systems See above   Past Medical History:  Diagnosis Date  . Allergic rhinitis   . Arthritis    "back; ankles; hands; knees" (10/31/2013)  . Bilateral sensorineural hearing loss   . CAD (coronary artery disease)    a. Nonobst in 2011. b. Abnormal nuc 09/2013 -> s/p cutting balloon to D2, mild LAD disease; c. 08/2015 MV: no ischemia, EF 71%.  . Carcinoma in situ in a polyp 1994   a. 1994 - malignant polyp removed during colonoscopy.  . Chronic diastolic CHF (congestive heart failure) ([place])    a. 09/2013 Echo: EF 60-65%, mild LVH, Gr1 DD.  Marland Kitchen Diverticulosis   . DJD (degenerative joint disease)   . Fatty liver   . GERD (gastroesophageal reflux disease)    a. Hx GERD/esophageal dysmotility followed by Dr. Olevia Perches.   Marland Kitchen Hemorrhoids   . Hiatal hernia    a. s/p Nissen fundoplication 4403.  Marland Kitchen Hyperlipidemia    a. patient unwilling to use statins.  . Hypertensive heart  disease   . IBS (irritable bowel syndrome)   . Macular degeneration 03/2009   Dr. Rosana Hoes  . Neuropathy    a. Hands, feet, legs.  . Orthostatic hypotension   . Osteomyelitis ([place])    a. 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 01/2016 s/p RLE transtibial amputation 2/2 Charcot rocker-bottom deformity and insensate neuropathy ulceration.  . Osteoporosis   . PE (pulmonary embolism) 12/2007   a. PE/DVT after neck surgery 2009. b. coumadin d/c 10-2008.  Marland Kitchen PONV (postoperative nausea and vomiting) 2009   neck surgery  . PPD positive   . Recurrent UTI   . RSD (reflex sympathetic dystrophy)    a. Chronic pain.  Marland Kitchen Spinal stenosis   . Type II diabetes mellitus (HCC)    no on medication  . Venous insufficiency    a. Contributing to LEE.    Past Surgical History:  Procedure Laterality Date  . AMPUTATION  03/06/2012   Procedure: AMPUTATION FOOT;  Surgeon: Newt Minion, MD;  Location: [facility];  Service: Orthopedics;  Laterality: Left;  FIFTH RAY AMPUTATION   . AMPUTATION Right 02/18/2016   Procedure: AMPUTATION BELOW KNEE;  Surgeon: Newt Minion, MD;  Location: [facility];  Service:  Orthopedics;  Laterality: Right;  . AMPUTATION Left 11/22/2016   Procedure: Amputation 4th Toe Left Foot at Metatarsophalangeal Joint;  Surgeon: Newt Minion, MD;  Location: [facility];  Service: Orthopedics;  Laterality: Left;  . ANKLE FUSION  09/27/2012   Procedure: ANKLE FUSION;  Surgeon: Newt Minion, MD;  Location: [facility];  Service: Orthopedics;  Laterality: Left;  Left Tibiocalcaneal Fusion  . ANKLE FUSION Right 05/09/2013   Procedure: ANKLE FUSION;  Surgeon: Newt Minion, MD;  Location: [facility];  Service: Orthopedics;  Laterality: Right;  Excision Osteomyelitis Base 1st MT Right Foot, Fusion Medial Column  . ANTERIOR CERVICAL DECOMP/DISCECTOMY FUSION  12/18/07   For OA,  Dr. Lorin Mercy:  fu by a PE  . CARDIAC CATHETERIZATION  05/2010    at [facility]  . CARPAL TUNNEL RELEASE  Left 09/2011  . CATARACT EXTRACTION W/ INTRAOCULAR LENS  IMPLANT, BILATERAL  2004   feb 2004 left, aug 2004 right  . CHOLECYSTECTOMY  03/2002  . CORONARY ANGIOPLASTY  10/31/2013  . CORONARY ARTERY BYPASS GRAFT  09/27/2016  . CYST REMOVAL HAND  06/2003  . FOOT BONE EXCISION Right 06/2009  . LAPAROSCOPIC RIGHT HEMI COLECTOMY N/A 01/08/2015   Procedure: LAPAROSCOPIC ASSISTED RIGHT HEMI COLECTOMY;  Surgeon: Johnathan Hausen, MD;  Location: [facility];  Service: General;  Laterality: N/A;  . LEFT HEART CATHETERIZATION WITH CORONARY ANGIOGRAM N/A 10/31/2013   Procedure: LEFT HEART CATHETERIZATION WITH CORONARY ANGIOGRAM;  Surgeon: Jettie Booze, MD;  Location[facility] CATH LAB;  Service: Cardiovascular;  Laterality: N/A;  . NASAL SEPTUM SURGERY  09/1963  . NISSEN FUNDOPLICATION  10/28/1094  . PERCUTANEOUS CORONARY INTERVENTION-BALLOON ONLY  10/31/2013   Procedure: PERCUTANEOUS CORONARY INTERVENTION-BALLOON ONLY;  Surgeon: Jettie Booze, MD;  Location: [facility] CATH LAB;  Service: Cardiovascular;;  . ROTATOR CUFF REPAIR Right 07/2007   Dr. Percell Miller  . ROTATOR CUFF REPAIR Left 11/2004  . stent in left leg, behind knee  06/27/2017   x2, done at [facility] cardiology in [facility]  . TOE AMPUTATION Right 08/2008   3rd toe, Dr. Sharol Given due to osteomyelitis  . VAGINAL HYSTERECTOMY  03/1975  . VEIN LIGATION Bilateral 03/1966  . VENA CAVA FILTER PLACEMENT  01/2010   green filter; "due to blood clots"  . WISDOM TOOTH EXTRACTION  06/2007    Allergies as of 09/22/2020      Reactions   Cymbalta [duloxetine Hcl] Swelling   Swelling in legs   Gabapentin Swelling   Swelling in legs Swelling in legs   Silicone Hives, Itching, Dermatitis, Rash   Metformin And Related Other (See Comments)   dizzy, tired, chills, diarrhea, and nausea   Adhesive [tape] Rash   rash rash   Cefazolin Hives   Hives Hives   Ciprofloxacin Other (See Comments)   Other reaction(s): Other (See Comments) Per pt, caused body aches Per pt, caused  body aches   Clorazepate Dipotassium Other (See Comments)   Unknown reaction   Dilaudid [hydromorphone Hcl] Other (See Comments)    confused, intense itching   Doxycycline Rash   Enablex [darifenacin Hydrobromide Er] Other (See Comments)   Hypotension, near syncope   Levofloxacin Other (See Comments)   Causes wrist pain   Lyrica [pregabalin] Swelling   Swelling in legs   Methadone Hcl Other (See Comments)   Reaction unknown   Morphine And Related Other (See Comments)   Confusion, constipation.   Talwin [pentazocine] Other (See Comments)   "climbing walls" anxiety  Medication List       Accurate as of September 22, 2020 11:59 PM. If you have any questions, ask your nurse or doctor.        STOP taking these medications   predniSONE 10 MG tablet Commonly known as: DELTASONE Stopped by: [name], MD     TAKE these medications   Alpha-Lipoic Acid 600 MG Caps Take 600 mg by mouth 2 (two) times daily.   amitriptyline 25 MG tablet Commonly known as: ELAVIL Take 1 tablet (25 mg total) by mouth at bedtime.   atorvastatin 40 MG tablet Commonly known as: LIPITOR Take 40 mg by mouth daily.   augmented betamethasone dipropionate 0.05 % cream Commonly known as: DIPROLENE-AF Apply topically 2 (two) times daily.   carvedilol 25 MG tablet Commonly known as: COREG Take 25 mg by mouth 2 (two) times daily with a meal.   ciclopirox 0.77 % cream Commonly known as: LOPROX Apply 1 application topically 2 (two) times daily as needed.   clopidogrel 75 MG tablet Commonly known as: PLAVIX Take 75 mg by mouth at bedtime.   diclofenac Sodium 1 % Gel Commonly known as: VOLTAREN Apply 2-4 g topically 4 (four) times daily as needed.   dicyclomine 10 MG capsule Commonly known as: BENTYL Take 1 capsule (10 mg total) by mouth 3 (three) times daily before meals.   Emgality 120 MG/ML Soaj Generic drug: Galcanezumab-gnlm Inject 120 mg into the skin every 30 (thirty) days.    famotidine 20 MG tablet Commonly known as: PEPCID Take 1 tablet (20 mg total) by mouth daily.   famotidine 20 MG tablet Commonly known as: PEPCID Take 1 tablet (20 mg total) by mouth 2 (two) times daily.   ferrous sulfate 325 (65 FE) MG tablet Commonly known as: FeroSul Take 1 tablet (325 mg total) by mouth 2 (two) times daily with a meal.   freestyle lancets CHECK BLOOD SUGAR TWICE DAILY   FREESTYLE LITE test strip Generic drug: glucose blood USE TO CHECK BLOOD SUGAR NO MORE THAN TWICE DAILY   furosemide 20 MG tablet Commonly known as: LASIX Take 20 mg by mouth daily.   HYDROcodone-acetaminophen 5-325 MG tablet Commonly known as: NORCO/VICODIN Take 1 tablet by mouth every 6 (six) hours as needed for moderate pain.   hydrocortisone 25 MG suppository Commonly known as: ANUSOL-HC Place 1 suppository (25 mg total) rectally at bedtime. Do not use for more than 7 days then stop for at least 7 days   ketoconazole 2 % cream Commonly known as: NIZORAL Apply 1 application topically daily. Started by: [name], MD   lidocaine 5 % Commonly known as: Lidoderm Place 1 patch onto the skin daily. Remove & Discard patch within 12 hours or as directed by MD   lubiprostone 24 MCG capsule Commonly known as: [name] Take 1 capsule (24 mcg total) by mouth 2 (two) times daily with a meal.   meclizine 25 MG tablet Commonly known as: ANTIVERT Take 1 tablet (25 mg total) by mouth 2 (two) times daily as needed for dizziness.   methenamine 1 g tablet Commonly known as: HIPREX Take 1 tablet by mouth 2 (two) times daily.   nitrofurantoin (macrocrystal-monohydrate) 100 MG capsule Commonly known as: Macrobid Take 1 capsule (100 mg total) by mouth 2 (two) times daily.   nitrofurantoin 100 MG capsule Commonly known as: MACRODANTIN Take 100 mg by mouth 2 (two) times daily.   nitroGLYCERIN 0.4 MG SL tablet Commonly known as: NITROSTAT Place 1 tablet (0.4  mg total) under the tongue every  5 (five) minutes x 3 doses as needed for chest pain.   ondansetron 4 MG disintegrating tablet Commonly known as: ZOFRAN-ODT Take 1 tablet (4 mg total) by mouth every 8 (eight) hours as needed for nausea or vomiting.   OVER THE COUNTER MEDICATION 1 tablet 3 (three) times daily. Berberorine Gluco Defense   OVER THE COUNTER MEDICATION Take 1 capsule by mouth 2 (two) times daily. Integrative Digestive Formula   pantoprazole 20 MG tablet Commonly known as: PROTONIX Take 1 tablet (20 mg total) by mouth daily.   pramipexole 0.125 MG tablet Commonly known as: MIRAPEX Take 1-2 tablets (0.125-0.25 mg total) by mouth at bedtime as needed.   PreserVision AREDS 2 Caps Take 1 capsule by mouth 2 (two) times daily.   REFRESH OP Place 1 drop into both eyes 3 (three) times daily as needed (dry eyes).   Vitamin B-12 500 MCG Subl Place 500 mcg under the tongue daily.   Vitamin D3 125 MCG (5000 UT) Caps Take 5,000 Units by mouth every evening. Reported on 11/24/2015   Xarelto 2.5 MG Tabs tablet Generic drug: rivaroxaban Take 2.5 mg by mouth 2 (two) times daily.          Objective:   Physical Exam BP (!) 144/78 (BP Location: Left Arm, Patient Position: Sitting, Cuff Size: Large)   Pulse (!) 110   Temp 98.8 F (37.1 C) (Oral)   Ht 5\' 4"  (1.626 m)   SpO2 96%   BMI 26.57 kg/m  General:   Well developed, NAD, BMI noted. HEENT:  Normocephalic . Face symmetric, atraumatic MSK: Palpation of the upper chest and cervical spine slightly TTP. Neck rotation: Triggers pain when she turns to the left or right. Lungs:  CTA B Normal respiratory effort, no intercostal retractions, no accessory muscle use. Heart: RRR,  no murmur.  Breast: Bilateral examination with no dominant mass, skin of the breast normal, nipple normal.  No axillary LADs.  Skin: Has a rash at the right upper chest.  See picture Neurologic:  alert & oriented X3.  Speech normal, gait not tested, history of amputation. Motor  exam upper extremities: Symmetric. DTRs upper extremities symmetric, able to get DTR also from the left knee. Psych--  Cognition and judgment appear intact.  Cooperative with normal attention span and concentration.  Behavior appropriate. No anxious or depressed appearing.        Assessment     Assessment DM, Neuropathy, amputations due to Charcot feet. - metformin intolerant  (lethargic) HTN  Hyperlipidemia LUNG MASS -incidental per CT 2009,  PET scan 01-2008: likely  benign, CT 08-2009 no change, no further CTs per Dr Gwenette Greet - Had a CT in [place] 09-2016 "spiculated mass", saw Dr Lamonte Sakai,  CT  09/2017 stable CV: ---CAD sees Dr Meda Coffee and a cardiologist in [facility] --NSTEMI 09-2016. Outside hospital: Cath 2 vessel disease, CABG 2, LIMA to LAD and Diag   ---Chronic diastolic CHF ---PVD: Aortogram and a stent L Leg Dr Feliberto Gottron, 06-27-2017 @ [place] (per pt) GI: GERD, IBS, colon polyps, PUD, abdominal pain "post polypectomy syndrome" naueas meds prn (antivert-zofran, gets nausea when car traveling) Osteoporosis  MSK,pain mngmt : --Reflex sympathetic dystrophy --Neuropathy --DJD --Spinal stenosis  --H/o Osteomyelitis  Foot --s/p amputation:  4th 5th L toes , R BKA  (01-2016, dx osteomyelitis) w/ phantom pain  -- sees Dr Sharol Given prn She had local injections with Dr. Maia Petties 2016 w/o apparent help. She took oxycodone after surgery and that  did NOT seem to help better than hydrocodone. Intolerant to Cymbalta, Neurontin ; Lyrica helped but caused edema. Recurrent UTIs: Dr Junious Silk  Venous insufficiency  H/o  + PPD H/o hypothyroidism: TSHs stable H/o dyspnea   PLAN:  Right arm pain: That is her chief complaint today, pain goes from the wrist and to the neck, worse with neck movements, she also has pain at the right side of the chest, right breast, unclear to me if there is a relationship. . Suspect radiculopathy, recommend to see Ortho, referal sent  Again unclear if right-sided chest and  breast pain are related, check a chest x-ray. Rash, right upper chest, first seen 05-2020, not responding to topical steroids, quite pruritic.  Trial with ketoconazole, if no better will rec to see dermatology. CAD: Went to the ER 06-2020, 1 troponin was slt elevated, f/u trop was wnl. Not admitted , rec  ER if she has any substernal chest pain.  (Other than the right-sided, right breast pain). Anxiety: See next Social:  She is here with her son Dellis Filbert. Lost her daughter [name] on Thanksgiving day.  She also lost her sister back in the summer.  Condolences provided, to call if she feels she needs a medicine. Currently is going to live mostly at her son's house in [place]. RTC 2 weeks, I do not have time to evaluate her for more issues.   Time is spent with the patient 45 minutes due to extensive chart review, complicated number of symptoms.also, listening therapy provided for her recent loss.  This visit occurred during the SARS-CoV-2 public health emergency.  Safety protocols were in place, including screening questions prior to the visit, additional usage of staff PPE, and extensive cleaning of exam room while observing appropriate contact time as indicated for disinfecting solutions.

## 2020-09-25 NOTE — Assessment & Plan Note (Signed)
Right arm pain: That is her chief complaint today, pain goes from the wrist and to the neck, worse with neck movements, she also has pain at the right side of the chest, right breast, unclear to me if there is a relationship. . Suspect radiculopathy, recommend to see Ortho, referal sent  Again unclear if right-sided chest and breast pain are related, check a chest x-ray. Rash, right upper chest, first seen 05-2020, not responding to topical steroids, quite pruritic.  Trial with ketoconazole, if no better will rec to see dermatology. CAD: Went to the ER 06-2020, 1 troponin was slt elevated, f/u trop was wnl. Not admitted , rec  ER if she has any substernal chest pain.  (Other than the right-sided, right breast pain). Anxiety: See next Social:  She is here with her son Dellis Filbert. Lost her daughter [name] on Thanksgiving day.  She also lost her sister back in the summer.  Condolences provided, to call if she feels she needs a medicine. Currently is going to live mostly at her son's house in [place]. RTC 2 weeks, I do not have time to evaluate her for more issues.

## 2020-09-28 ENCOUNTER — Encounter: Payer: Self-pay | Admitting: Orthopaedic Surgery

## 2020-09-28 ENCOUNTER — Ambulatory Visit (INDEPENDENT_AMBULATORY_CARE_PROVIDER_SITE_OTHER): Payer: Medicare Other | Admitting: Orthopaedic Surgery

## 2020-09-28 ENCOUNTER — Telehealth: Payer: Self-pay | Admitting: Orthopedic Surgery

## 2020-09-28 DIAGNOSIS — S32030D Wedge compression fracture of third lumbar vertebra, subsequent encounter for fracture with routine healing: Secondary | ICD-10-CM | POA: Diagnosis not present

## 2020-09-28 DIAGNOSIS — I2510 Atherosclerotic heart disease of native coronary artery without angina pectoris: Secondary | ICD-10-CM | POA: Diagnosis not present

## 2020-09-28 DIAGNOSIS — M4722 Other spondylosis with radiculopathy, cervical region: Secondary | ICD-10-CM

## 2020-09-28 NOTE — Telephone Encounter (Signed)
Pt was in the office seeing dr Lorin Mercy and asked If we could ask dr duda if she could have a new rx for a prosthetic

## 2020-09-29 DIAGNOSIS — I700 Atherosclerosis of aorta: Secondary | ICD-10-CM | POA: Diagnosis not present

## 2020-09-29 DIAGNOSIS — S32000A Wedge compression fracture of unspecified lumbar vertebra, initial encounter for closed fracture: Secondary | ICD-10-CM | POA: Insufficient documentation

## 2020-09-29 DIAGNOSIS — M4722 Other spondylosis with radiculopathy, cervical region: Secondary | ICD-10-CM | POA: Insufficient documentation

## 2020-09-29 NOTE — Telephone Encounter (Signed)
Call patient, the MRI of her back shows new mild compression fracture at L3-4, no treatment needed

## 2020-09-29 NOTE — Progress Notes (Signed)
Office Visit Note   Patient: [name]           Date of Birth: May 13, 1935           MRN: [id number] Visit Date: 09/28/2020              Requested by: Colon Branch, [address] [place],  [place] [address] PCP: Colon Branch, MD   Assessment & Plan: Visit Diagnoses:  1. Compression fracture of L3 vertebra with routine healing, subsequent encounter   2. Other spondylosis with radiculopathy, cervical region     Plan: We will check patient back in 6 weeks.  We discussed options including fusion the next level up above her solid two-level cervical fusion.  We discussed options with allograft and plate.  She does have some pain related to compression fracture.  We will recheck her after the holidays.  Follow-Up Instructions: Return in about 6 weeks (around 11/09/2020).   Orders:  No orders of the defined types were placed in this encounter.  No orders of the defined types were placed in this encounter.     Procedures: No procedures performed   Clinical Data: No additional findings.   Subjective: Chief Complaint  Patient presents with  . Neck - Pain    HPI 85 year old female patient had previous two-level cervical fusion 2009 at C4-5 C5-6 by me is seen with neck and right arm pain for several weeks.  She has had some fall she has had some increased pain.  Lumbar MRI scan 08/12/2020 showed mild compression inferior endplate L3 superior endplate of L4 consistent with acute or subacute fracture and chronic L1 fracture.  She did have some moderate stenosis at L3-4.  Patient states she has neck pain pain in the paraspinal muscles.  She is taking hydrocodone chronically which does not give her relief.  PDMP numbers are 381, 170 and 0.  Last prescription is Norco 5/325 4 tablets daily.  Review of Systems previous below-knee amputation problems with the right stump with the small area that is slowly healing.  Not currently draining.  Past history pain management  with dystrophic type problems.  Spondylosis at C3-4 above her fusion.   Objective: Vital Signs: Ht 5\' 4"  (1.626 m)   Wt 154 lb (69.9 kg)   BMI 26.43 kg/m   Physical Exam Constitutional:      Appearance: She is well-developed.  HENT:     Head: Normocephalic.     Right Ear: External ear normal.     Left Ear: External ear normal.  Eyes:     Pupils: Pupils are equal, round, and reactive to light.  Neck:     Thyroid: No thyromegaly.     Trachea: No tracheal deviation.  Cardiovascular:     Rate and Rhythm: Normal rate.  Pulmonary:     Effort: Pulmonary effort is normal.  Abdominal:     Palpations: Abdomen is soft.  Skin:    General: Skin is warm and dry.  Neurological:     Mental Status: She is alert and oriented to person, place, and time.  Psychiatric:        Behavior: Behavior normal.     Ortho Exam right BKA with small area no longer draining slight eschar present slight tenderness.  She has some brachial plexus tenderness.  50% rotation of her neck with some discomfort.  Specialty Comments:  No specialty comments available.  Imaging: No results found.   PMFS History: Patient  Active Problem List   Diagnosis Date Noted  . Lumbar compression fracture ([place]) 09/29/2020  . Other spondylosis with radiculopathy, cervical region 09/29/2020  . External hemorrhoids 02/15/2018  . History of three vessel coronary artery bypass - 2017 , in [facility] 09/25/2017  . PVD (peripheral vascular disease) ([place])-- stent L leg 06-2017 Dr Feliberto [facility] 07/23/2017  . Phantom pain ([place]) 04/01/2017  . Constipation, chronic 04/01/2017  . S/P unilateral BKA (below knee amputation), right ([place]) 12/14/2016  . Toe osteomyelitis, left ([place]) 11/17/2016  . Acute on chronic diastolic CHF (congestive heart failure), NYHA class 3 ([place]) 03/30/2016  . Coronary artery disease due to lipid rich plaque 03/30/2016  . Acute renal failure superimposed on stage 3 chronic kidney disease ([place]) 03/30/2016  . S/P below  knee amputation ([place]) 02/18/2016  . Memory loss 11/26/2015  . PCP NOTES >>>>>>>>>>>>>>>>>>>> 10/18/2015  . S/P partial colectomy 01/08/2015  . 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 Anemia 01/18/2014  . CAD (coronary artery disease)   . Carcinoma in situ in a polyp   . Depression 09/17/2013  . Charcot's joint 08/21/2013  . Foot osteomyelitis, right ([place]) 07/16/2013  . Chronic pain associated with significant psychosocial dysfunction 05/06/2013  . Neuropathy   . Hyperthyroidism   . Lower extremity edema 03/08/2011  . Annual physical exam 03/08/2011  . ALLERGIC RHINITIS 04/15/2010  . ORTHOSTATIC DIZZINESS 04/13/2010  . HIATAL HERNIA 12/06/2009  . GASTRIC ULCER, HX OF 12/06/2009  . DYSPNEA 03/18/2009  . DEGENERATIVE JOINT DISEASE 06/03/2008  . COLONIC POLYPS 01/27/2008  . DIVERTICULOSIS, COLON 01/27/2008  . IRRITABLE BOWEL SYNDROME, HX OF 01/27/2008  . Pulmonary nodule, right 01/22/2008  . Hyperlipidemia 12/30/2007  . Osteoporosis 12/30/2007  . TB SKIN TEST, POSITIVE 08/09/2007  . DM type 2 with diabetic peripheral neuropathy ([place]) 05/09/2007  . Reflex sympathetic dystrophy-- PAIN MNGMT  05/09/2007  . Essential hypertension 04/15/2007  . GERD 04/15/2007   Past Medical History:  Diagnosis Date  . Allergic rhinitis   . Arthritis    "back; ankles; hands; knees" (10/31/2013)  . Bilateral sensorineural hearing loss   . CAD (coronary artery disease)    a. Nonobst in 2011. b. Abnormal nuc 09/2013 -> s/p cutting balloon to D2, mild LAD disease; c. 08/2015 MV: no ischemia, EF 71%.  . Carcinoma in situ in a polyp 1994   a. 1994 - malignant polyp removed during colonoscopy.  . Chronic diastolic CHF (congestive heart failure) ([place])    a. 09/2013 Echo: EF 60-65%, mild LVH, Gr1 DD.  Marland Kitchen Diverticulosis   . DJD (degenerative joint disease)   . Fatty liver   . GERD (gastroesophageal reflux disease)    a. Hx GERD/esophageal dysmotility followed by Dr. Olevia Perches.    Marland Kitchen Hemorrhoids   . Hiatal hernia    a. s/p Nissen fundoplication 1937.  Marland Kitchen Hyperlipidemia    a. patient unwilling to use statins.  . Hypertensive heart disease   . IBS (irritable bowel syndrome)   . Macular degeneration 03/2009   Dr. Rosana Hoes  . Neuropathy    a. Hands, feet, legs.  . Orthostatic hypotension   . Osteomyelitis ([place])    a. 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 01/2016 s/p RLE transtibial amputation 2/2 Charcot rocker-bottom deformity and insensate neuropathy ulceration.  . Osteoporosis   . PE (pulmonary embolism) 12/2007   a. PE/DVT after neck surgery 2009. b. coumadin d/c 10-2008.  Marland Kitchen PONV (postoperative nausea and vomiting) 2009  neck surgery  . PPD positive   . Recurrent UTI   . RSD (reflex sympathetic dystrophy)    a. Chronic pain.  Marland Kitchen Spinal stenosis   . Type II diabetes mellitus (H[facility])    no on medication  . Venous insufficiency    a. Contributing to LEE.    Family History  Problem Relation Age of Onset  . Heart disease Mother        mitral valve replaced  . Arthritis Mother   . Diabetic kidney disease Daughter   . Diabetes Father   . Stroke Father   . Migraines Sister   . Hypertension Other   . Breast cancer Other   . Colon cancer Neg Hx   . Esophageal cancer Neg Hx   . Stomach cancer Neg Hx   . Rectal cancer Neg Hx     Past Surgical History:  Procedure Laterality Date  . AMPUTATION  03/06/2012   Procedure: AMPUTATION FOOT;  Surgeon: Newt Minion, MD;  Location: [facility];  Service: Orthopedics;  Laterality: Left;  FIFTH RAY AMPUTATION   . AMPUTATION Right 02/18/2016   Procedure: AMPUTATION BELOW KNEE;  Surgeon: Newt Minion, MD;  Location: [facility];  Service: Orthopedics;  Laterality: Right;  . AMPUTATION Left 11/22/2016   Procedure: Amputation 4th Toe Left Foot at Metatarsophalangeal Joint;  Surgeon: Newt Minion, MD;  Location: [place];  Service: Orthopedics;  Laterality: Left;  . ANKLE FUSION   09/27/2012   Procedure: ANKLE FUSION;  Surgeon: Newt Minion, MD;  Location: [facility];  Service: Orthopedics;  Laterality: Left;  Left Tibiocalcaneal Fusion  . ANKLE FUSION Right 05/09/2013   Procedure: ANKLE FUSION;  Surgeon: Newt Minion, MD;  Location: [facility];  Service: Orthopedics;  Laterality: Right;  Excision Osteomyelitis Base 1st MT Right Foot, Fusion Medial Column  . ANTERIOR CERVICAL DECOMP/DISCECTOMY FUSION  12/18/07   For OA,  Dr. Lorin Mercy:  fu by a PE  . CARDIAC CATHETERIZATION  05/2010    at [facility]  . CARPAL TUNNEL RELEASE Left 09/2011  . CATARACT EXTRACTION W/ INTRAOCULAR LENS  IMPLANT, BILATERAL  2004   feb 2004 left, aug 2004 right  . CHOLECYSTECTOMY  03/2002  . CORONARY ANGIOPLASTY  10/31/2013  . CORONARY ARTERY BYPASS GRAFT  09/27/2016  . CYST REMOVAL HAND  06/2003  . FOOT BONE EXCISION Right 06/2009  . LAPAROSCOPIC RIGHT HEMI COLECTOMY N/A 01/08/2015   Procedure: LAPAROSCOPIC ASSISTED RIGHT HEMI COLECTOMY;  Surgeon: Johnathan Hausen, MD;  Location: [facility];  Service: General;  Laterality: N/A;  . LEFT HEART CATHETERIZATION WITH CORONARY ANGIOGRAM N/A 10/31/2013   Procedure: LEFT HEART CATHETERIZATION WITH CORONARY ANGIOGRAM;  Surgeon: Jettie Booze, MD;  Location[facility] LAB;  Service: Cardiovascular;  Laterality: N/A;  . NASAL SEPTUM SURGERY  09/1963  . NISSEN FUNDOPLICATION  10/24/9415  . PERCUTANEOUS CORONARY INTERVENTION-BALLOON ONLY  10/31/2013   Procedure: PERCUTANEOUS CORONARY INTERVENTION-BALLOON ONLY;  Surgeon: Jettie Booze, MD;  Location: [facility] CATH LAB;  Service: Cardiovascular;;  . ROTATOR CUFF REPAIR Right 07/2007   Dr. Percell Miller  . ROTATOR CUFF REPAIR Left 11/2004  . stent in left leg, behind knee  06/27/2017   x2, done at [facility]  . TOE AMPUTATION Right 08/2008   3rd toe, Dr. Sharol Given due to osteomyelitis  . VAGINAL HYSTERECTOMY  03/1975  . VEIN LIGATION Bilateral 03/1966  . VENA CAVA FILTER PLACEMENT  01/2010   green filter; "due to blood  clots"  .  WISDOM TOOTH EXTRACTION  06/2007   Social History   Occupational History  . Occupation: Retired     Comment: from Orthoptist   Tobacco Use  . Smoking status: Never Smoker  . Smokeless tobacco: Never Used  . Tobacco comment: tried for a few months in college.   Vaping Use  . Vaping Use: Never used  Substance and Sexual Activity  . Alcohol use: No    Alcohol/week: 0.0 standard drinks  . Drug use: No  . Sexual activity: Not Currently    Birth control/protection: Post-menopausal

## 2020-09-29 NOTE — Telephone Encounter (Signed)
I called and sw pt's son and he will pick up rx today. Also states that pt had MRI L spine in October and was not able to come into the office due to a death in the family ( her daughter) would you please review MRI from 07/2020 and advise of findings and if there is anything that needs to be scheduled for the pt?

## 2020-09-30 ENCOUNTER — Ambulatory Visit: Payer: Medicare Other | Admitting: Nurse Practitioner

## 2020-10-01 DIAGNOSIS — I872 Venous insufficiency (chronic) (peripheral): Secondary | ICD-10-CM | POA: Diagnosis not present

## 2020-10-01 DIAGNOSIS — I700 Atherosclerosis of aorta: Secondary | ICD-10-CM | POA: Diagnosis not present

## 2020-10-01 DIAGNOSIS — I871 Compression of vein: Secondary | ICD-10-CM | POA: Diagnosis not present

## 2020-10-01 NOTE — Telephone Encounter (Signed)
I called and sw pt's son to advise of message below. Will call with any other questions.

## 2020-10-04 ENCOUNTER — Telehealth: Payer: Self-pay | Admitting: Neurology

## 2020-10-04 NOTE — Telephone Encounter (Signed)
This Rx was sent on 06/14/20 and confirmed by [facility] [phone]. I called that location and confirmed they had received it but that a PA is needed. Unaware of any requests for this. Will work on [place].

## 2020-10-04 NOTE — Telephone Encounter (Signed)
Completed Emgality PA on Cover My Meds. Key: [id number]. Awaiting determination from [employer].

## 2020-10-04 NOTE — Telephone Encounter (Signed)
Pt.'s son Dellis Filbert is on [place]. He states pharmacy states they never received the prescription for Galcanezumab-gnlm (EMGALITY) 120 MG/ML SOAJ.  Pharmacy: [place] [phone]

## 2020-10-04 NOTE — Telephone Encounter (Signed)
OVANVB:[id number];HTXHFS:[id number];Review Type:Prior Auth;Coverage Start Date:09/04/2020;Coverage End Date:04/02/2021;

## 2020-10-06 ENCOUNTER — Ambulatory Visit (INDEPENDENT_AMBULATORY_CARE_PROVIDER_SITE_OTHER): Payer: Medicare Other | Admitting: Internal Medicine

## 2020-10-06 ENCOUNTER — Encounter: Payer: Self-pay | Admitting: Internal Medicine

## 2020-10-06 ENCOUNTER — Other Ambulatory Visit: Payer: Self-pay

## 2020-10-06 VITALS — BP 126/81 | HR 96 | Temp 97.7°F | Ht 64.0 in

## 2020-10-06 DIAGNOSIS — M5412 Radiculopathy, cervical region: Secondary | ICD-10-CM | POA: Diagnosis not present

## 2020-10-06 DIAGNOSIS — M4802 Spinal stenosis, cervical region: Secondary | ICD-10-CM | POA: Diagnosis not present

## 2020-10-06 DIAGNOSIS — R21 Rash and other nonspecific skin eruption: Secondary | ICD-10-CM | POA: Diagnosis not present

## 2020-10-06 DIAGNOSIS — I2510 Atherosclerotic heart disease of native coronary artery without angina pectoris: Secondary | ICD-10-CM | POA: Diagnosis not present

## 2020-10-06 DIAGNOSIS — I10 Essential (primary) hypertension: Secondary | ICD-10-CM | POA: Diagnosis not present

## 2020-10-06 MED ORDER — NALOXONE HCL 4 MG/0.1ML NA LIQD: 1.0000 | Freq: Once | NASAL | 0 refills | Status: AC | PRN

## 2020-10-06 MED ORDER — HYDROCODONE-ACETAMINOPHEN 10-325 MG PO TABS: 1.0000 | ORAL_TABLET | Freq: Four times a day (QID) | ORAL | 0 refills | Status: DC | PRN

## 2020-10-06 NOTE — Patient Instructions (Addendum)
Per our records you are due for an eye exam. Please contact your eye doctor to schedule an appointment. Please have them send copies of your office visit notes to [place]. Our fax number is [phone].   Try the following for pain:  Oxycodone 5 mg along with Tylenol 500 mg 4 times a day as needed.  You can also try hydrocodone 10 mg with  tylenol on it: 1 tablet 4 times a day.  You can use one or the other and see what works better for you. Never mix the 2 medications. Let me know which one works better for you.  Please have Narcan on hand in case you get very sleepy  Schedule your next appointment for about 6 weeks from today.      Naloxone nasal spray What is this medicine? NALOXONE (nal OX one) is a narcotic blocker. It is used to treat narcotic drug overdose. This medicine may be used for other purposes; ask your health care provider or pharmacist if you have questions. COMMON BRAND NAME(S): Narcan What should I tell my health care provider before I take this medicine? They need to know if you have any of these conditions:  drug abuse or addiction  heart disease  an unusual or allergic reaction to naloxone, other medicines, foods, dyes, or preservatives  pregnant or trying to get pregnant  breast-feeding How should I use this medicine? This medicine is for use in the nose. Lay the person on their back. Support their neck with your hand and allow the head to tilt back before giving the medicine. The nasal spray should be given into 1 nostril. After giving the medicine, move the person onto their side. Do not remove or test the nasal spray until ready to use. Get emergency medical help right away after giving the first dose of this medicine, even if the person wakes up. You should be familiar with how to recognize the signs and symptoms of a narcotic overdose. If more doses are needed, give the additional dose in the other nostril. Talk to your pediatrician regarding the use  of this medicine in children. While this drug may be prescribed for children as young as newborns for selected conditions, precautions do apply. Overdosage: If you think you have taken too much of this medicine contact a poison control center or emergency room at once. NOTE: This medicine is only for you. Do not share this medicine with others. What if I miss a dose? This does not apply. What may interact with this medicine? This medicine is only used during an emergency. No interactions are expected during emergency use. This list may not describe all possible interactions. Give your health care provider a list of all the medicines, herbs, non-prescription drugs, or dietary supplements you use. Also tell them if you smoke, drink alcohol, or use illegal drugs. Some items may interact with your medicine. What should I watch for while using this medicine? Keep this medicine ready for use in the case of a narcotic overdose. Make sure that you have the phone number of your doctor or health care professional and local hospital ready. You may need to have additional doses of this medicine. Each nasal spray contains a single dose. Some emergencies may require additional doses. After use, bring the treated person to the nearest hospital or call 911. Make sure the treating health care professional knows that the person has received an injection of this medicine. You will receive additional instructions on what to  do during and after use of this medicine before an emergency occurs. What side effects may I notice from receiving this medicine? Side effects that you should report to your doctor or health care professional as soon as possible:  allergic reactions like skin rash, itching or hives, swelling of the face, lips, or tongue  breathing problems  fast, irregular heartbeat  high blood pressure  pain that was controlled by narcotic pain medicine  seizures Side effects that usually do not require  medical attention (report to your doctor or health care professional if they continue or are bothersome):  anxious  chills  diarrhea  fever  headache  muscle pain  nausea, vomiting  nose irritation like dryness, congestion, and swelling  sweating This list may not describe all possible side effects. Call your doctor for medical advice about side effects. You may report side effects to FDA at 1-800-FDA-1088. Where should I keep my medicine? Keep out of the reach of children. Store between 4 and 40 degrees C (39 and 104 degrees F). Do not freeze. Throw away any unused medicine after the expiration date. Keep in original box until ready to use. NOTE: This sheet is a summary. It may not cover all possible information. If you have questions about this medicine, talk to your doctor, pharmacist, or health care provider.  2020 Elsevier/Gold Standard (2015-11-12 13:55:10)

## 2020-10-06 NOTE — Progress Notes (Signed)
Subjective:    Patient ID: [name], female    DOB: 1935/04/30, 85 y.o.   MRN: [id number]  DOS:  10/06/2020  Type of visit - description: Acute Follow-up from last visit. Since the last visit saw neurosurgery and orthopedic surgery.  Rash, see last visit: Better  Pain management: Pain from the neck radiating to the arm is severe. Needs some help.  Also yesterday developed sore throat. Denies fever chills. No actual GERD, dysphagia or odynophagia. No URI type of symptoms.  Review of Systems See above   Past Medical History:  Diagnosis Date   Allergic rhinitis    Arthritis    "back; ankles; hands; knees" (10/31/2013)   Bilateral sensorineural hearing loss    CAD (coronary artery disease)    a. Nonobst in 2011. b. Abnormal nuc 09/2013 -> s/p cutting balloon to D2, mild LAD disease; c. 08/2015 MV: no ischemia, EF 71%.   Carcinoma in situ in a polyp 1994   a. 1994 - malignant polyp removed during colonoscopy.   Chronic diastolic CHF (congestive heart failure) ([place])    a. 09/2013 Echo: EF 60-65%, mild LVH, Gr1 DD.   Diverticulosis    DJD (degenerative joint disease)    Fatty liver    GERD (gastroesophageal reflux disease)    a. Hx GERD/esophageal dysmotility followed by Dr. Olevia Perches.    Hemorrhoids    Hiatal hernia    a. s/p Nissen fundoplication 2841.   Hyperlipidemia    a. patient unwilling to use statins.   Hypertensive heart disease    IBS (irritable bowel syndrome)    Macular degeneration 03/2009   Dr. Rosana Hoes   Neuropathy    a. Hands, feet, legs.   Orthostatic hypotension    Osteomyelitis (HCC)    a. 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 01/2016 s/p RLE transtibial amputation 2/2 Charcot rocker-bottom deformity and insensate neuropathy ulceration.   Osteoporosis    PE (pulmonary embolism) 12/2007   a. PE/DVT after neck surgery 2009. b. coumadin d/c 10-2008.   PONV (postoperative  nausea and vomiting) 2009   neck surgery   PPD positive    Recurrent UTI    RSD (reflex sympathetic dystrophy)    a. Chronic pain.   Spinal stenosis    Type II diabetes mellitus ([place])    no on medication   Venous insufficiency    a. Contributing to LEE.    Past Surgical History:  Procedure Laterality Date   AMPUTATION  03/06/2012   Procedure: AMPUTATION FOOT;  Surgeon: Newt Minion, MD;  Location: [facility];  Service: Orthopedics;  Laterality: Left;  FIFTH RAY AMPUTATION    AMPUTATION Right 02/18/2016   Procedure: AMPUTATION BELOW KNEE;  Surgeon: Newt Minion, MD;  Location: [facility];  Service: Orthopedics;  Laterality: Right;   AMPUTATION Left 11/22/2016   Procedure: Amputation 4th Toe Left Foot at Metatarsophalangeal Joint;  Surgeon: Newt Minion, MD;  Location: [facility];  Service: Orthopedics;  Laterality: Left;   ANKLE FUSION  09/27/2012   Procedure: ANKLE FUSION;  Surgeon: Newt Minion, MD;  Location: [facility];  Service: Orthopedics;  Laterality: Left;  Left Tibiocalcaneal Fusion   ANKLE FUSION Right 05/09/2013   Procedure: ANKLE FUSION;  Surgeon: Newt Minion, MD;  Location: [facility];  Service: Orthopedics;  Laterality: Right;  Excision Osteomyelitis Base 1st MT Right Foot, Fusion Medial Column   ANTERIOR CERVICAL DECOMP/DISCECTOMY FUSION  12/18/07   For OA,  Dr. Lorin Mercy:  fu by a PE   CARDIAC CATHETERIZATION  05/2010    at [facility] Left 09/2011   CATARACT EXTRACTION W/ INTRAOCULAR LENS  IMPLANT, BILATERAL  2004   feb 2004 left, aug 2004 right   CHOLECYSTECTOMY  03/2002   CORONARY ANGIOPLASTY  10/31/2013   CORONARY ARTERY BYPASS GRAFT  09/27/2016   CYST REMOVAL HAND  06/2003   FOOT BONE EXCISION Right 06/2009   LAPAROSCOPIC RIGHT HEMI COLECTOMY N/A 01/08/2015   Procedure: LAPAROSCOPIC ASSISTED RIGHT HEMI COLECTOMY;  Surgeon: Johnathan Hausen, MD;  Location: [facility];  Service: General;  Laterality: N/A;   LEFT HEART CATHETERIZATION WITH CORONARY  ANGIOGRAM N/A 10/31/2013   Procedure: LEFT HEART CATHETERIZATION WITH CORONARY ANGIOGRAM;  Surgeon: Jettie Booze, MD;  Location: [facility];  Service: Cardiovascular;  Laterality: N/A;   NASAL SEPTUM SURGERY  38/1017   NISSEN FUNDOPLICATION  02/20/257   PERCUTANEOUS CORONARY INTERVENTION-BALLOON ONLY  10/31/2013   Procedure: PERCUTANEOUS CORONARY INTERVENTION-BALLOON ONLY;  Surgeon: Jettie Booze, MD;  Location: [facility] CATH LAB;  Service: Cardiovascular;;   ROTATOR CUFF REPAIR Right 07/2007   Dr. Morene Rankins CUFF REPAIR Left 11/2004   stent in left leg, behind knee  06/27/2017   x2, done at [facility] cardiology in [place] Right 08/2008   3rd toe, Dr. Sharol Given due to osteomyelitis   VAGINAL HYSTERECTOMY  03/1975   VEIN LIGATION Bilateral 03/1966   VENA CAVA FILTER PLACEMENT  01/2010   green filter; "due to blood clots"   WISDOM TOOTH EXTRACTION  06/2007    Allergies as of 10/06/2020      Reactions   Cymbalta [duloxetine Hcl] Swelling   Swelling in legs   Gabapentin Swelling   Swelling in legs Swelling in legs   Silicone Hives, Itching, Dermatitis, Rash   Metformin And Related Other (See Comments)   dizzy, tired, chills, diarrhea, and nausea   Adhesive [tape] Rash   rash rash   Cefazolin Hives   Hives Hives   Ciprofloxacin Other (See Comments)   Other reaction(s): Other (See Comments) Per pt, caused body aches Per pt, caused body aches   Clorazepate Dipotassium Other (See Comments)   Unknown reaction   Dilaudid [hydromorphone Hcl] Other (See Comments)    confused, intense itching   Doxycycline Rash   Enablex [darifenacin Hydrobromide Er] Other (See Comments)   Hypotension, near syncope   Levofloxacin Other (See Comments)   Causes wrist pain   Lyrica [pregabalin] Swelling   Swelling in legs   Methadone Hcl Other (See Comments)   Reaction unknown   Morphine And Related Other (See Comments)   Confusion, constipation.   Talwin  [pentazocine] Other (See Comments)   "climbing walls" anxiety      Medication List       Accurate as of October 06, 2020 11:59 PM. If you have any questions, ask your nurse or doctor.        STOP taking these medications   HYDROcodone-acetaminophen 5-325 MG tablet Commonly known as: NORCO/VICODIN Replaced by: HYDROcodone-acetaminophen 10-325 MG tablet Stopped by: [name], MD   nitrofurantoin (macrocrystal-monohydrate) 100 MG capsule Commonly known as: Macrobid Stopped by: Bishop Dublin, CMA   nitrofurantoin 100 MG capsule Commonly known as: MACRODANTIN Stopped by: Bishop Dublin, CMA     TAKE these medications   Alpha-Lipoic Acid 600 MG Caps Take 600 mg by mouth 2 (two) times daily.   amitriptyline 25  MG tablet Commonly known as: ELAVIL Take 1 tablet (25 mg total) by mouth at bedtime.   atorvastatin 40 MG tablet Commonly known as: LIPITOR Take 40 mg by mouth daily.   augmented betamethasone dipropionate 0.05 % cream Commonly known as: DIPROLENE-AF Apply topically 2 (two) times daily.   carvedilol 25 MG tablet Commonly known as: COREG Take 25 mg by mouth 2 (two) times daily with a meal.   ciclopirox 0.77 % cream Commonly known as: LOPROX Apply 1 application topically 2 (two) times daily as needed.   clopidogrel 75 MG tablet Commonly known as: PLAVIX Take 75 mg by mouth at bedtime.   diclofenac Sodium 1 % Gel Commonly known as: VOLTAREN Apply 2-4 g topically 4 (four) times daily as needed.   dicyclomine 10 MG capsule Commonly known as: BENTYL Take 1 capsule (10 mg total) by mouth 3 (three) times daily before meals.   Emgality 120 MG/ML Soaj Generic drug: Galcanezumab-gnlm Inject 120 mg into the skin every 30 (thirty) days.   famotidine 20 MG tablet Commonly known as: PEPCID Take 1 tablet (20 mg total) by mouth 2 (two) times daily. What changed: Another medication with the same name was removed. Continue taking this medication, and follow the  directions you see here. Changed by: Bishop Dublin, CMA   ferrous sulfate 325 (65 FE) MG tablet Commonly known as: FeroSul Take 1 tablet (325 mg total) by mouth 2 (two) times daily with a meal.   freestyle lancets CHECK BLOOD SUGAR TWICE DAILY   FREESTYLE LITE test strip Generic drug: glucose blood USE TO CHECK BLOOD SUGAR NO MORE THAN TWICE DAILY   furosemide 20 MG tablet Commonly known as: LASIX Take 20 mg by mouth daily.   HYDROcodone-acetaminophen 10-325 MG tablet Commonly known as: NORCO Take 1 tablet by mouth every 6 (six) hours as needed. Replaces: HYDROcodone-acetaminophen 5-325 MG tablet Started by: [name], MD   hydrocortisone 25 MG suppository Commonly known as: ANUSOL-HC Place 1 suppository (25 mg total) rectally at bedtime. Do not use for more than 7 days then stop for at least 7 days   ketoconazole 2 % cream Commonly known as: NIZORAL Apply 1 application topically daily.   lidocaine 5 % Commonly known as: Lidoderm Place 1 patch onto the skin daily. Remove & Discard patch within 12 hours or as directed by MD   lubiprostone 24 MCG capsule Commonly known as: [name] Take 1 capsule (24 mcg total) by mouth 2 (two) times daily with a meal.   meclizine 25 MG tablet Commonly known as: ANTIVERT Take 1 tablet (25 mg total) by mouth 2 (two) times daily as needed for dizziness.   methenamine 1 g tablet Commonly known as: HIPREX Take 1 tablet by mouth 2 (two) times daily.   naloxone 4 MG/0.1ML Liqd nasal spray kit Commonly known as: NARCAN Place 1 spray into the nose once as needed for up to 1 dose. Started by: [name], MD   nitroGLYCERIN 0.4 MG SL tablet Commonly known as: NITROSTAT Place 1 tablet (0.4 mg total) under the tongue every 5 (five) minutes x 3 doses as needed for chest pain.   ondansetron 4 MG disintegrating tablet Commonly known as: ZOFRAN-ODT Take 1 tablet (4 mg total) by mouth every 8 (eight) hours as needed for nausea or vomiting.    OVER THE COUNTER MEDICATION 1 tablet 3 (three) times daily. Berberorine Gluco Defense   OVER THE COUNTER MEDICATION Take 1 capsule by mouth 2 (two) times daily. Integrative Digestive  Formula   pantoprazole 20 MG tablet Commonly known as: PROTONIX Take 1 tablet (20 mg total) by mouth daily.   pramipexole 0.125 MG tablet Commonly known as: MIRAPEX Take 1-2 tablets (0.125-0.25 mg total) by mouth at bedtime as needed.   PreserVision AREDS 2 Caps Take 1 capsule by mouth 2 (two) times daily.   REFRESH OP Place 1 drop into both eyes 3 (three) times daily as needed (dry eyes).   rivaroxaban 2.5 MG Tabs tablet Commonly known as: XARELTO Take 2.5 mg by mouth 2 (two) times daily.   Vitamin B-12 500 MCG Subl Place 500 mcg under the tongue daily.   Vitamin D3 125 MCG (5000 UT) Caps Take 5,000 Units by mouth every evening. Reported on 11/24/2015          Objective:   Physical Exam BP 126/81 (BP Location: Left Arm, Patient Position: Sitting, Cuff Size: Large)    Pulse 96    Temp 97.7 F (36.5 C) (Oral)    Ht _0  (1.626 m)    SpO2 97%    BMI 26.43 kg/m  General:   Well developed, NAD, BMI noted. HEENT:  Normocephalic . Face symmetric, atraumatic. Nose not congested, throat symmetric, not red, no white spots. Lungs:  CTA B Normal respiratory effort, no intercostal retractions, no accessory muscle use. Heart: RRR,  no murmur.  Lower extremities: no pretibial edema bilaterally  Skin: Rash at the right upper chest: Seems less red, almost resolved. Neurologic:  alert & oriented X3.  Speech normal, gait not tested. Psych--  Cognition and judgment appear intact.  Cooperative with normal attention span and concentration.  Behavior appropriate. No anxious or depressed appearing.      Assessment     Assessment DM, Neuropathy, amputations due to Charcot feet. - metformin intolerant  (lethargic) HTN  Hyperlipidemia LUNG MASS -incidental per CT 2009,  PET scan 01-2008: likely   benign, CT 08-2009 no change, no further CTs per Dr Gwenette Greet - Had a CT in [place] 09-2016 "spiculated mass", saw Dr Lamonte Sakai,  CT  09/2017 stable CV: ---CAD sees Dr Meda Coffee and a cardiologist in [facility] --NSTEMI 09-2016. Outside hospital: Cath 2 vessel disease, CABG 2, LIMA to LAD and Diag   ---Chronic diastolic CHF ---PVD: Aortogram and a stent L Leg Dr Feliberto Gottron, 06-27-2017 @ [facility] (per pt) GI: GERD, IBS, colon polyps, PUD, abdominal pain "post polypectomy syndrome" naueas meds prn (antivert-zofran, gets nausea when car traveling) Osteoporosis  MSK,pain mngmt : --Reflex sympathetic dystrophy --Neuropathy --DJD --Spinal stenosis  --H/o Osteomyelitis  Foot --s/p amputation:  4th 5th L toes , R BKA  (01-2016, dx osteomyelitis) w/ phantom pain  -- sees Dr Sharol Given prn She had local injections with Dr. Maia Petties 2016 w/o apparent help. She took oxycodone after surgery and that did NOT seem to help better than hydrocodone. Intolerant to Cymbalta, Neurontin ; Lyrica helped but caused edema. Recurrent UTIs: Dr Junious Silk  Venous insufficiency  H/o  + PPD H/o hypothyroidism: TSHs stable H/o dyspnea   PLAN:  Right arm pain, radiculopathy. Saw orthopedic surgery 09/28/2020, she was diagnosed with spondylosis and radiculopathy of the neck, also L3 compression fracture. Was rec to RTC in 6 weeks. This morning however saw neurosurgery Dr. Trenton Gammon and they agree to do surgery at the earliest patient convenience. In the meantime the pain is severe, she has been taking hydrocodone 5 mg up to 2 tablets every 6 hours barely controlling the pain. I suggest we could try oxycodone, the son actually told me  that they have access to it. Plan: Increase hydrocodone from 5 mg to 10 mg, use 4 times a day prn, rx sent. Also can try:  oxycodone 5 mg (they have some at home) and Tylenol 500 mg 4 times a day. See AVS. Both the patient and her son are very aware that  these medications can increase the risk of falls and subsequent  fractures. They told me clearly that they understand the risks and accept it. Narcan sent, explaining how to use it as well. Rash: See last visit. Improved. Sore throat: See HPI, exam is negative, we agreed on observation, if she developed URI type of symptoms needs to be checked for Covid. RTC 6 weeks  Time spent with the patient and her son 32 minutes, extensive discussions about pain management, risk of narcotic, how to use Narcan. This visit occurred during the SARS-CoV-2 public health emergency.  Safety protocols were in place, including screening questions prior to the visit, additional usage of staff PPE, and extensive cleaning of exam room while observing appropriate contact time as indicated for disinfecting solutions.

## 2020-10-07 NOTE — Assessment & Plan Note (Signed)
Right arm pain, radiculopathy. Saw orthopedic surgery 09/28/2020, she was diagnosed with spondylosis and radiculopathy of the neck, also L3 compression fracture. Was rec to RTC in 6 weeks. This morning however saw neurosurgery Dr. Trenton Gammon and they agree to do surgery at the earliest patient convenience. In the meantime the pain is severe, she has been taking hydrocodone 5 mg up to 2 tablets every 6 hours barely controlling the pain. I suggest we could try oxycodone, the son actually told me that they have access to it. Plan: Increase hydrocodone from 5 mg to 10 mg, use 4 times a day prn, rx sent. Also can try:  oxycodone 5 mg (they have some at home) and Tylenol 500 mg 4 times a day. See AVS. Both the patient and her son are very aware that  these medications can increase the risk of falls and subsequent fractures. They told me clearly that they understand the risks and accept it. Narcan sent, explaining how to use it as well. Rash: See last visit. Improved. Sore throat: See HPI, exam is negative, we agreed on observation, if she developed URI type of symptoms needs to be checked for Covid. RTC 6 weeks

## 2020-10-11 ENCOUNTER — Encounter: Payer: Self-pay | Admitting: Internal Medicine

## 2020-10-12 ENCOUNTER — Telehealth: Payer: Self-pay | Admitting: Orthopedic Surgery

## 2020-10-12 NOTE — Telephone Encounter (Signed)
Records 9/21- present faxed [facility] [phone]

## 2020-10-19 ENCOUNTER — Ambulatory Visit: Payer: Medicare Other | Admitting: Internal Medicine

## 2020-10-28 ENCOUNTER — Other Ambulatory Visit: Payer: Self-pay | Admitting: Neurosurgery

## 2020-11-09 ENCOUNTER — Ambulatory Visit: Payer: Medicare Other | Admitting: Orthopaedic Surgery

## 2020-11-12 ENCOUNTER — Encounter: Payer: Self-pay | Admitting: *Deleted

## 2020-11-12 DIAGNOSIS — K5904 Chronic idiopathic constipation: Secondary | ICD-10-CM | POA: Insufficient documentation

## 2020-11-16 ENCOUNTER — Telehealth: Payer: Self-pay | Admitting: *Deleted

## 2020-11-16 ENCOUNTER — Telehealth: Payer: Self-pay | Admitting: Orthopedic Surgery

## 2020-11-16 NOTE — Telephone Encounter (Signed)
Clinical questions answered today for [facility] for pts [name] prior authorization   Waiting on response

## 2020-11-16 NOTE — Telephone Encounter (Signed)
Pt called stating she needs a shrinker in the size XXL and she would like a CB to inform her how to go about getting one.   [phone]

## 2020-11-17 ENCOUNTER — Other Ambulatory Visit: Payer: Self-pay | Admitting: Medical

## 2020-11-17 ENCOUNTER — Other Ambulatory Visit: Payer: Self-pay | Admitting: Internal Medicine

## 2020-11-18 NOTE — Telephone Encounter (Signed)
Can you help with this?

## 2020-11-18 NOTE — Telephone Encounter (Signed)
She can get one at [facility]. If she needs an rx we can fax to [facility] for her

## 2020-11-18 NOTE — Telephone Encounter (Signed)
IC s/w patients son [phone] Elim Peale.  He said that previously Dr Sharol Given had discussed with them about getting shrinkers delivered to the prosthetic facility patient currently goes to in [facility] but they have not heard anything else about it. Stated patient sees Phoebe Sharps at [facility] in [place].  I told him we would further discuss with Dr Sharol Given and get back to them early next week.

## 2020-11-19 ENCOUNTER — Other Ambulatory Visit (HOSPITAL_COMMUNITY): Payer: Medicare Other

## 2020-11-19 ENCOUNTER — Inpatient Hospital Stay (HOSPITAL_COMMUNITY): Admission: RE | Admit: 2020-11-19 | Payer: Medicare Other | Source: Ambulatory Visit

## 2020-11-23 NOTE — Telephone Encounter (Signed)
I called and sw pt's son to advise that the pt can order these online at [email or web address] and she can order as many as she would like they will deliver to her home and she can use promotion code [id number] and this will give a 20% discount off order. To call with any questions.

## 2020-11-24 NOTE — Progress Notes (Signed)
Your procedure is scheduled on Monday, November 29, 2020 from 3:35PM - 5:14PM.  Report to [facility] Main Entrance "A" at 1:30 P.M., and check in at the Admitting office.  Call this number if you have problems the morning of surgery:  [phone]  Call [phone] if you have any questions prior to your surgery date Monday-Friday 8am-4pm    Remember:  Do not eat or drink after midnight the night before your surgery    Take these medicines the morning of surgery with A SIP OF WATER:  atorvastatin (LIPITOR) carvedilol (COREG) dicyclomine (BENTYL)  famotidine (PEPCID)  pantoprazole (PROTONIX) methenamine (HIPREX)  IF NEEDED:  HYDROcodone-acetaminophen (NORCO) meclizine (ANTIVERT)  nitroGLYCERIN (NITROSTAT) ondansetron (ZOFRAN-ODT) Polyvinyl Alcohol-Povidone (REFRESH OP)  Follow your surgeon's instructions on when to stop Plavix and Xarelto.  If no instructions were given by your surgeon then you will need to call the office to get those instructions.     As of today, STOP taking any Aspirin (unless otherwise instructed by your surgeon) Aleve, Naproxen, Ibuprofen, Motrin, Advil, Goody's, BC's, all herbal medications, fish oil, and all vitamins.   HOW TO MANAGE YOUR DIABETES BEFORE AND AFTER SURGERY  Why is it important to control my blood sugar before and after surgery? . Improving blood sugar levels before and after surgery helps healing and can limit problems. . A way of improving blood sugar control is eating a healthy diet by: o  Eating less sugar and carbohydrates o  Increasing activity/exercise o  Talking with your doctor about reaching your blood sugar goals . High blood sugars (greater than 180 mg/dL) can raise your risk of infections and slow your recovery, so you will need to focus on controlling your diabetes during the weeks before surgery. . Make sure that the doctor who takes care of your diabetes knows about your planned surgery including the date and  location.  How do I manage my blood sugar before surgery? . Check your blood sugar at least 4 times a day, starting 2 days before surgery, to make sure that the level is not too high or low. . Check your blood sugar the morning of your surgery when you wake up and every 2 hours until you get to the Short Stay unit. o If your blood sugar is less than 70 mg/dL, you will need to treat for low blood sugar: - Do not take insulin. - Treat a low blood sugar (less than 70 mg/dL) with  cup of clear juice (cranberry or apple), 4 glucose tablets, OR glucose gel. - Recheck blood sugar in 15 minutes after treatment (to make sure it is greater than 70 mg/dL). If your blood sugar is not greater than 70 mg/dL on recheck, call [phone] for further instructions. . Report your blood sugar to the short stay nurse when you get to Short Stay.  . If you are admitted to the hospital after surgery: o Your blood sugar will be checked by the staff and you will probably be given insulin after surgery (instead of oral diabetes medicines) to make sure you have good blood sugar levels. o The goal for blood sugar control after surgery is 80-180 mg/dL.                     Do not wear jewelry, make up, or nail polish            Do not wear lotions, powders, perfumes, or deodorant.  Do not shave 48 hours prior to surgery.            Do not bring valuables to the hospital.            [facility] is not responsible for any belongings or valuables.  Do NOT Smoke (Tobacco/Vaping) or drink Alcohol 24 hours prior to your procedure If you use a CPAP at night, you may bring all equipment for your overnight stay.   Contacts, glasses, dentures or bridgework may not be worn into surgery.      For patients admitted to the hospital, discharge time will be determined by your treatment team.   Patients discharged the day of surgery will not be allowed to drive home, and someone needs to stay with them for 24  hours.    Special instructions:   [facility]- Preparing For Surgery  Before surgery, you can play an important role. Because skin is not sterile, your skin needs to be as free of germs as possible. You can reduce the number of germs on your skin by washing with CHG (chlorahexidine gluconate) Soap before surgery.  CHG is an antiseptic cleaner which kills germs and bonds with the skin to continue killing germs even after washing.    Oral Hygiene is also important to reduce your risk of infection.  Remember - BRUSH YOUR TEETH THE MORNING OF SURGERY WITH YOUR REGULAR TOOTHPASTE  Please do not use if you have an allergy to CHG or antibacterial soaps. If your skin becomes reddened/irritated stop using the CHG.  Do not shave (including legs and underarms) for at least 48 hours prior to first CHG shower. It is OK to shave your face.  Please follow these instructions carefully.   1. Shower the NIGHT BEFORE SURGERY and the MORNING OF SURGERY with CHG Soap.   2. If you chose to wash your hair, wash your hair first as usual with your normal shampoo.  3. After you shampoo, rinse your hair and body thoroughly to remove the shampoo.  4. Use CHG as you would any other liquid soap. You can apply CHG directly to the skin and wash gently with a scrungie or a clean washcloth.   5. Apply the CHG Soap to your body ONLY FROM THE NECK DOWN.  Do not use on open wounds or open sores. Avoid contact with your eyes, ears, mouth and genitals (private parts). Wash Face and genitals (private parts)  with your normal soap.   6. Wash thoroughly, paying special attention to the area where your surgery will be performed.  7. Thoroughly rinse your body with warm water from the neck down.  8. DO NOT shower/wash with your normal soap after using and rinsing off the CHG Soap.  9. Pat yourself dry with a CLEAN TOWEL.  10. Wear CLEAN PAJAMAS to bed the night before surgery  11. Place CLEAN SHEETS on your bed the night of  your first shower and DO NOT SLEEP WITH PETS.   Day of Surgery: Wear Clean/Comfortable clothing the morning of surgery Do not apply any deodorants/lotions.   Remember to brush your teeth WITH YOUR REGULAR TOOTHPASTE.   Please read over the following fact sheets that you were given.

## 2020-11-25 ENCOUNTER — Other Ambulatory Visit (HOSPITAL_COMMUNITY)
Admission: RE | Admit: 2020-11-25 | Discharge: 2020-11-25 | Disposition: A | Discharge status: Home / Self Care | Payer: Medicare Other | Source: Ambulatory Visit | Attending: Neurosurgery | Admitting: Neurosurgery

## 2020-11-25 ENCOUNTER — Other Ambulatory Visit: Payer: Self-pay

## 2020-11-25 ENCOUNTER — Encounter (HOSPITAL_COMMUNITY)
Admission: RE | Admit: 2020-11-25 | Discharge: 2020-11-25 | Disposition: A | Discharge status: Home / Self Care | Payer: Medicare Other | Source: Ambulatory Visit | Attending: Neurosurgery | Admitting: Neurosurgery

## 2020-11-25 ENCOUNTER — Encounter (HOSPITAL_COMMUNITY): Payer: Self-pay

## 2020-11-25 DIAGNOSIS — Z7901 Long term (current) use of anticoagulants: Secondary | ICD-10-CM | POA: Diagnosis not present

## 2020-11-25 DIAGNOSIS — E119 Type 2 diabetes mellitus without complications: Secondary | ICD-10-CM | POA: Insufficient documentation

## 2020-11-25 DIAGNOSIS — Z951 Presence of aortocoronary bypass graft: Secondary | ICD-10-CM | POA: Diagnosis not present

## 2020-11-25 DIAGNOSIS — I2510 Atherosclerotic heart disease of native coronary artery without angina pectoris: Secondary | ICD-10-CM | POA: Diagnosis not present

## 2020-11-25 DIAGNOSIS — Z86711 Personal history of pulmonary embolism: Secondary | ICD-10-CM | POA: Diagnosis not present

## 2020-11-25 DIAGNOSIS — Z20822 Contact with and (suspected) exposure to covid-19: Secondary | ICD-10-CM | POA: Insufficient documentation

## 2020-11-25 DIAGNOSIS — Z01812 Encounter for preprocedural laboratory examination: Secondary | ICD-10-CM | POA: Insufficient documentation

## 2020-11-25 DIAGNOSIS — Z86718 Personal history of other venous thrombosis and embolism: Secondary | ICD-10-CM | POA: Diagnosis not present

## 2020-11-25 HISTORY — DX: Unspecified dementia, unspecified severity, without behavioral disturbance, psychotic disturbance, mood disturbance, and anxiety: F03.90

## 2020-11-25 LAB — BASIC METABOLIC PANEL
Anion gap: 10 (ref 5–15)
BUN: 15 mg/dL (ref 8–23)
CO2: 24 mmol/L (ref 22–32)
Calcium: 8.8 mg/dL — ABNORMAL LOW (ref 8.9–10.3)
Chloride: 105 mmol/L (ref 98–111)
Creatinine, Ser: 1.06 mg/dL — ABNORMAL HIGH (ref 0.44–1.00)
GFR, Estimated: 52 mL/min — ABNORMAL LOW (ref >60)
Glucose, Bld: 111 mg/dL — ABNORMAL HIGH (ref 70–99)
Potassium: 3.8 mmol/L (ref 3.5–5.1)
Sodium: 139 mmol/L (ref 135–145)

## 2020-11-25 LAB — SARS CORONAVIRUS 2 (TAT 6-24 HRS): SARS Coronavirus 2: NEGATIVE

## 2020-11-25 LAB — CBC WITH DIFFERENTIAL/PLATELET
Abs Immature Granulocytes: 0.01 10*3/uL (ref 0.00–0.07)
Basophils Absolute: 0 10*3/uL (ref 0.0–0.1)
Basophils Relative: 0 %
Eosinophils Absolute: 0.2 10*3/uL (ref 0.0–0.5)
Eosinophils Relative: 4 %
HCT: 38 % (ref 36.0–46.0)
Hemoglobin: 12.1 g/dL (ref 12.0–15.0)
Immature Granulocytes: 0 %
Lymphocytes Relative: 39 %
Lymphs Abs: 1.6 10*3/uL (ref 0.7–4.0)
MCH: 29.7 pg (ref 26.0–34.0)
MCHC: 31.8 g/dL (ref 30.0–36.0)
MCV: 93.1 fL (ref 80.0–100.0)
Monocytes Absolute: 0.3 10*3/uL (ref 0.1–1.0)
Monocytes Relative: 8 %
Neutro Abs: 2 10*3/uL (ref 1.7–7.7)
Neutrophils Relative %: 49 %
Platelets: 172 10*3/uL (ref 150–400)
RBC: 4.08 MIL/uL (ref 3.87–5.11)
RDW: 12.6 % (ref 11.5–15.5)
WBC: 4.1 10*3/uL (ref 4.0–10.5)
nRBC: 0 % (ref 0.0–0.2)

## 2020-11-25 LAB — GLUCOSE, CAPILLARY: Glucose-Capillary: 88 mg/dL (ref 70–99)

## 2020-11-25 LAB — SURGICAL PCR SCREEN
MRSA, PCR: NEGATIVE
Staphylococcus aureus: POSITIVE — AB

## 2020-11-25 MED ORDER — CHLORHEXIDINE GLUCONATE CLOTH 2 % EX PADS: 6.0000 | MEDICATED_PAD | Freq: Once | CUTANEOUS | Status: DC

## 2020-11-25 NOTE — Progress Notes (Incomplete)
PCP - DR PAZ Cardiologist - DR   Lavone Orn SHAW IN [facility]    CLEARANCE GIVEN     [phone] CABG 2017  Chest x-ray - 12/21                             EKG - 12/21 Stress Test - 11/16 ECHO - 12/14 Cardiac Cath - 12/17               A1C 11/21    Fasting Blood Sugar - THEY DONT KNOW Checks Blood Sugar __0___ times a day  Blood Thinner Instructions    PLAXIX   AND XARELTO STOPPED  Aspirin Instructions:STOP   COVID TEST- 11/25/20   Anesthesia review: HEART HX Patient denies shortness of breath, fever, cough and chest pain at PAT appointment   All instructions explained to the patient, with a verbal understanding of the material. Patient agrees to go over the instructions while at home for a better understanding. Patient also instructed to self quarantine after being tested for COVID-19. The opportunity to ask questions was provided.

## 2020-11-26 NOTE — Progress Notes (Signed)
Anesthesia Chart Review:  Follows with cardiologist Dr. Aretta Nip in [place] for history of CAD s/p CABG x2 in 2017.  She was last seen 10/01/2020.  Per note, she had a Lexiscan MPI in 2019 that showed no ischemia.  She had an echo 03/29/2017 with EF 60%, grade 1 DD, trace TR, mild elevation of RVSP.  She is also followed for peripheral vascular disease and status post left CIV PTA and stenting on 09/08/2020.  S/p right BKA 02/18/2016.  Cardiac clearance from Dr. Manuella Ghazi dated 10/11/2020 states patient is moderate risk and can stop Plavix for 5 days and Xarelto for 2 days.  History of PE/DVT status post IVC filter placement.  History of GERD and esophageal dysmotility.  History of hiatal hernia s/p Nissen fundoplication in 3354.  History of chronic migraines followed by neurology, maintained on Emgality.  DM2, last A1c 8.1 on 08/27/2020.   CHEST - 2 VIEW 09/22/2020: COMPARISON:  Prior chest x-ray 07/17/2020  FINDINGS: Stable cardiac and mediastinal contours with mild cardiomegaly. Patient is status post median sternotomy with evidence of prior LIMA bypass. Chronic background bronchitic changes demonstrate no significant interval change or progression. No focal airspace infiltrate, pleural effusion or pneumothorax. An IVC filter is noted overlying the expected location of the infrarenal vena cava. No acute osseous abnormality. Stable compression deformity of L1.  IMPRESSION: Stable chest x-ray without evidence of acute cardiopulmonary process.  Lexiscan 11/19/2017 (copy in chart): EF 64%, normal SPECT perfusion imaging.  Stress EKG test result normal.  Normal LVSF.  Echo 03/29/2017 (copy in chart): EF 60%, estimated LVEF normal, normal left atrial pressure, grade 1 diastolic function, trace TR, mild elevation of estimated RVSP at 36.1 mmHg.   Wynonia Musty [facility] Short Stay Center/Anesthesiology Phone [phone] 11/26/2020 10:35 AM'

## 2020-11-26 NOTE — Anesthesia Preprocedure Evaluation (Addendum)
Anesthesia Evaluation  Patient identified by MRN, date of birth, ID band Patient awake    Reviewed: Allergy & Precautions, NPO status , Patient's Chart, lab work & pertinent test results, reviewed documented beta blocker date and time   History of Anesthesia Complications (+) PONV and history of anesthetic complications  Airway Mallampati: II  TM Distance: >3 FB Neck ROM: Full    Dental  (+) Missing,    Pulmonary PE (2009)   Pulmonary exam normal        Cardiovascular hypertension, Pt. on home beta blockers and Pt. on medications + CAD, + Peripheral Vascular Disease and +CHF  Normal cardiovascular exam  2016 nuclear stress EF 71%, low risk study     Neuro/Psych Depression Dementia negative neurological ROS     GI/Hepatic Neg liver ROS, hiatal hernia, GERD  Medicated and Controlled,  Endo/Other  diabetes, Type 2  Renal/GU ARFRenal disease  negative genitourinary   Musculoskeletal  (+) Arthritis ,   Abdominal   Peds  Hematology negative hematology ROS (+)   Anesthesia Other Findings Day of surgery medications reviewed with patient.  Reproductive/Obstetrics negative OB ROS                           Anesthesia Physical Anesthesia Plan  ASA: III  Anesthesia Plan: General   Post-op Pain Management:    Induction: Intravenous  PONV Risk Score and Plan: 4 or greater and Treatment may vary due to age or medical condition, Ondansetron, Dexamethasone and Propofol infusion  Airway Management Planned: Oral ETT and Video Laryngoscope Planned  Additional Equipment: None  Intra-op Plan:   Post-operative Plan: Extubation in OR  Informed Consent: I have reviewed the patients History and Physical, chart, labs and discussed the procedure including the risks, benefits and alternatives for the proposed anesthesia with the patient or authorized representative who has indicated his/her understanding  and acceptance.     Dental advisory given  Plan Discussed with: CRNA  Anesthesia Plan Comments: (PAT note by Karoline Caldwell, PA-C: Follows with cardiologist Dr. Aretta Nip in [facility] for history of CAD s/p CABG x2 in 2017.  She was last seen 10/01/2020.  Per note, she had a Lexiscan MPI in 2019 that showed no ischemia.  She had an echo 03/29/2017 with EF 60%, grade 1 DD, trace TR, mild elevation of RVSP.  She is also followed for peripheral vascular disease and status post left CIV PTA and stenting on 09/08/2020.  S/p right BKA 02/18/2016.  Cardiac clearance from Dr. Manuella Ghazi dated 10/11/2020 states patient is moderate risk and can stop Plavix for 5 days and Xarelto for 2 days.  History of PE/DVT status post IVC filter placement.  History of GERD and esophageal dysmotility.  History of hiatal hernia s/p Nissen fundoplication in 9211.  History of chronic migraines followed by neurology, maintained on Emgality.  DM2, last A1c 8.1 on 08/27/2020.   CHEST - 2 VIEW 09/22/2020: COMPARISON:  Prior chest x-ray 07/17/2020  FINDINGS: Stable cardiac and mediastinal contours with mild cardiomegaly. Patient is status post median sternotomy with evidence of prior LIMA bypass. Chronic background bronchitic changes demonstrate no significant interval change or progression. No focal airspace infiltrate, pleural effusion or pneumothorax. An IVC filter is noted overlying the expected location of the infrarenal vena cava. No acute osseous abnormality. Stable compression deformity of L1.  IMPRESSION: Stable chest x-ray without evidence of acute cardiopulmonary process.  Lexiscan 11/19/2017 (copy in chart): EF 64%,  normal SPECT perfusion imaging.  Stress EKG test result normal.  Normal LVSF.  Echo 03/29/2017 (copy in chart): EF 60%, estimated LVEF normal, normal left atrial pressure, grade 1 diastolic function, trace TR, mild elevation of estimated RVSP at 36.1 mmHg.  )      Anesthesia Quick  Evaluation

## 2020-11-29 ENCOUNTER — Ambulatory Visit (HOSPITAL_COMMUNITY): Payer: Medicare Other | Admitting: Certified Registered Nurse Anesthetist

## 2020-11-29 ENCOUNTER — Other Ambulatory Visit: Payer: Self-pay

## 2020-11-29 ENCOUNTER — Encounter (HOSPITAL_COMMUNITY)
Admission: RE | Disposition: A | Discharge status: Home / Self Care | Payer: Self-pay | Source: Home / Self Care | Attending: Neurosurgery

## 2020-11-29 ENCOUNTER — Ambulatory Visit (HOSPITAL_COMMUNITY): Payer: Medicare Other

## 2020-11-29 ENCOUNTER — Ambulatory Visit (HOSPITAL_COMMUNITY): Payer: Medicare Other | Admitting: Physician Assistant

## 2020-11-29 ENCOUNTER — Encounter (HOSPITAL_COMMUNITY): Payer: Self-pay | Admitting: Neurosurgery

## 2020-11-29 ENCOUNTER — Observation Stay (HOSPITAL_COMMUNITY)
Admission: RE | Admit: 2020-11-29 | Discharge: 2020-11-30 | Disposition: A | Discharge status: Home / Self Care | Payer: Medicare Other | Attending: Neurosurgery | Admitting: Neurosurgery

## 2020-11-29 DIAGNOSIS — F0390 Unspecified dementia without behavioral disturbance: Secondary | ICD-10-CM | POA: Insufficient documentation

## 2020-11-29 DIAGNOSIS — E119 Type 2 diabetes mellitus without complications: Secondary | ICD-10-CM | POA: Insufficient documentation

## 2020-11-29 DIAGNOSIS — Z79899 Other long term (current) drug therapy: Secondary | ICD-10-CM | POA: Insufficient documentation

## 2020-11-29 DIAGNOSIS — G992 Myelopathy in diseases classified elsewhere: Secondary | ICD-10-CM | POA: Diagnosis not present

## 2020-11-29 DIAGNOSIS — M5001 Cervical disc disorder with myelopathy,  high cervical region: Secondary | ICD-10-CM | POA: Diagnosis not present

## 2020-11-29 DIAGNOSIS — M4712 Other spondylosis with myelopathy, cervical region: Secondary | ICD-10-CM | POA: Diagnosis not present

## 2020-11-29 DIAGNOSIS — Z7901 Long term (current) use of anticoagulants: Secondary | ICD-10-CM | POA: Diagnosis not present

## 2020-11-29 DIAGNOSIS — Z419 Encounter for procedure for purposes other than remedying health state, unspecified: Secondary | ICD-10-CM

## 2020-11-29 DIAGNOSIS — M4312 Spondylolisthesis, cervical region: Secondary | ICD-10-CM | POA: Diagnosis not present

## 2020-11-29 DIAGNOSIS — M4802 Spinal stenosis, cervical region: Secondary | ICD-10-CM | POA: Diagnosis not present

## 2020-11-29 DIAGNOSIS — I5032 Chronic diastolic (congestive) heart failure: Secondary | ICD-10-CM | POA: Insufficient documentation

## 2020-11-29 DIAGNOSIS — M4322 Fusion of spine, cervical region: Secondary | ICD-10-CM | POA: Diagnosis not present

## 2020-11-29 DIAGNOSIS — I2510 Atherosclerotic heart disease of native coronary artery without angina pectoris: Secondary | ICD-10-CM | POA: Insufficient documentation

## 2020-11-29 DIAGNOSIS — Z981 Arthrodesis status: Secondary | ICD-10-CM | POA: Diagnosis not present

## 2020-11-29 DIAGNOSIS — I110 Hypertensive heart disease with heart failure: Secondary | ICD-10-CM | POA: Insufficient documentation

## 2020-11-29 DIAGNOSIS — G959 Disease of spinal cord, unspecified: Secondary | ICD-10-CM | POA: Diagnosis present

## 2020-11-29 HISTORY — PX: ANTERIOR CERVICAL DECOMP/DISCECTOMY FUSION: SHX1161

## 2020-11-29 LAB — GLUCOSE, CAPILLARY
Glucose-Capillary: 131 mg/dL — ABNORMAL HIGH (ref 70–99)
Glucose-Capillary: 105 mg/dL — ABNORMAL HIGH (ref 70–99)
Glucose-Capillary: 123 mg/dL — ABNORMAL HIGH (ref 70–99)
Glucose-Capillary: 205 mg/dL — ABNORMAL HIGH (ref 70–99)

## 2020-11-29 SURGERY — ANTERIOR CERVICAL DECOMPRESSION/DISCECTOMY FUSION 1 LEVEL
Anesthesia: General

## 2020-11-29 MED ORDER — LOPERAMIDE HCL 2 MG PO CAPS
2.0000 mg | ORAL_CAPSULE | ORAL | Status: DC | PRN
  Filled 2020-11-29: qty 1

## 2020-11-29 MED ORDER — SODIUM CHLORIDE 0.9 % IV SOLN: 250.0000 mL | INTRAVENOUS | Status: DC

## 2020-11-29 MED ORDER — ALPHA-LIPOIC ACID 600 MG PO CAPS
1200.0000 mg | ORAL_CAPSULE | Freq: Every day | ORAL | Status: DC
Start: 2020-11-29 — End: 2020-11-29

## 2020-11-29 MED ORDER — SODIUM CHLORIDE 0.9% FLUSH
3.0000 mL | Freq: Two times a day (BID) | INTRAVENOUS | Status: DC
  Administered 2020-11-29: 3 mL via INTRAVENOUS

## 2020-11-29 MED ORDER — FENTANYL CITRATE (PF) 100 MCG/2ML IJ SOLN
INTRAMUSCULAR | Status: AC
  Administered 2020-11-29: 50 ug via INTRAVENOUS
  Filled 2020-11-29: qty 2

## 2020-11-29 MED ORDER — VANCOMYCIN HCL IN DEXTROSE 1-5 GM/200ML-% IV SOLN
INTRAVENOUS | Status: AC
  Administered 2020-11-29: 1000 mg via INTRAVENOUS
  Filled 2020-11-29: qty 200

## 2020-11-29 MED ORDER — ONDANSETRON HCL 4 MG PO TABS
4.0000 mg | ORAL_TABLET | Freq: Four times a day (QID) | ORAL | Status: DC | PRN
  Administered 2020-11-29 – 2020-11-30 (×2): 4 mg via ORAL
  Filled 2020-11-29 (×2): qty 1

## 2020-11-29 MED ORDER — ROCURONIUM BROMIDE 10 MG/ML (PF) SYRINGE
PREFILLED_SYRINGE | INTRAVENOUS | Status: DC | PRN
  Administered 2020-11-29: 60 mg via INTRAVENOUS

## 2020-11-29 MED ORDER — NITROGLYCERIN 0.4 MG SL SUBL: 0.4000 mg | SUBLINGUAL_TABLET | SUBLINGUAL | Status: DC | PRN

## 2020-11-29 MED ORDER — LIDOCAINE 2% (20 MG/ML) 5 ML SYRINGE
INTRAMUSCULAR | Status: AC
  Filled 2020-11-29: qty 5

## 2020-11-29 MED ORDER — PANTOPRAZOLE SODIUM 40 MG PO TBEC
40.0000 mg | DELAYED_RELEASE_TABLET | Freq: Every day | ORAL | Status: DC
  Administered 2020-11-30: 40 mg via ORAL
  Filled 2020-11-29: qty 1

## 2020-11-29 MED ORDER — DEXAMETHASONE SODIUM PHOSPHATE 10 MG/ML IJ SOLN
INTRAMUSCULAR | Status: AC
  Filled 2020-11-29: qty 1

## 2020-11-29 MED ORDER — CYCLOBENZAPRINE HCL 10 MG PO TABS: 10.0000 mg | ORAL_TABLET | Freq: Three times a day (TID) | ORAL | Status: DC | PRN

## 2020-11-29 MED ORDER — 0.9 % SODIUM CHLORIDE (POUR BTL) OPTIME
TOPICAL | Status: DC | PRN
  Administered 2020-11-29: 1000 mL

## 2020-11-29 MED ORDER — PROPOFOL 10 MG/ML IV BOLUS
INTRAVENOUS | Status: AC
  Filled 2020-11-29: qty 20

## 2020-11-29 MED ORDER — CHLORHEXIDINE GLUCONATE 0.12 % MT SOLN: 15.0000 mL | Freq: Once | OROMUCOSAL | Status: AC

## 2020-11-29 MED ORDER — DIPHENHYDRAMINE HCL 50 MG/ML IJ SOLN
INTRAMUSCULAR | Status: AC
  Administered 2020-11-29: 12.5 mg via INTRAVENOUS
  Filled 2020-11-29: qty 1

## 2020-11-29 MED ORDER — PHENYLEPHRINE HCL-NACL 10-0.9 MG/250ML-% IV SOLN
INTRAVENOUS | Status: DC | PRN
  Administered 2020-11-29: 20 ug/min via INTRAVENOUS

## 2020-11-29 MED ORDER — HYDROMORPHONE HCL 1 MG/ML IJ SOLN: 1.0000 mg | INTRAMUSCULAR | Status: DC | PRN

## 2020-11-29 MED ORDER — VANCOMYCIN HCL IN DEXTROSE 750-5 MG/150ML-% IV SOLN: 750.0000 mg | Freq: Once | INTRAVENOUS | Status: DC

## 2020-11-29 MED ORDER — VITAMIN D 25 MCG (1000 UNIT) PO TABS: 5000.0000 [IU] | ORAL_TABLET | Freq: Every day | ORAL | Status: DC

## 2020-11-29 MED ORDER — VITAMIN E 45 MG (100 UNIT) PO CAPS
400.0000 [IU] | ORAL_CAPSULE | Freq: Every day | ORAL | Status: DC
  Administered 2020-11-30: 400 [IU] via ORAL
  Filled 2020-11-29 (×2): qty 4

## 2020-11-29 MED ORDER — ONDANSETRON HCL 4 MG/2ML IJ SOLN: 4.0000 mg | Freq: Four times a day (QID) | INTRAMUSCULAR | Status: DC | PRN

## 2020-11-29 MED ORDER — HYDROCODONE-ACETAMINOPHEN 10-325 MG PO TABS: 1.0000 | ORAL_TABLET | Freq: Four times a day (QID) | ORAL | Status: DC | PRN

## 2020-11-29 MED ORDER — ASCORBIC ACID 500 MG PO TABS
500.0000 mg | ORAL_TABLET | Freq: Every day | ORAL | Status: DC
  Administered 2020-11-30: 500 mg via ORAL
  Filled 2020-11-29 (×2): qty 1

## 2020-11-29 MED ORDER — FERROUS SULFATE 325 (65 FE) MG PO TABS
325.0000 mg | ORAL_TABLET | Freq: Two times a day (BID) | ORAL | Status: DC
  Administered 2020-11-30: 325 mg via ORAL
  Filled 2020-11-29: qty 1

## 2020-11-29 MED ORDER — FENTANYL CITRATE (PF) 250 MCG/5ML IJ SOLN
INTRAMUSCULAR | Status: AC
  Filled 2020-11-29: qty 5

## 2020-11-29 MED ORDER — HEMOSTATIC AGENTS (NO CHARGE) OPTIME
TOPICAL | Status: DC | PRN
  Administered 2020-11-29: 1 via TOPICAL

## 2020-11-29 MED ORDER — CICLOPIROX OLAMINE 0.77 % EX CREA: 1.0000 "application " | TOPICAL_CREAM | Freq: Two times a day (BID) | CUTANEOUS | Status: DC | PRN

## 2020-11-29 MED ORDER — DICYCLOMINE HCL 10 MG PO CAPS
10.0000 mg | ORAL_CAPSULE | Freq: Three times a day (TID) | ORAL | Status: DC
  Administered 2020-11-30: 10 mg via ORAL
  Filled 2020-11-29: qty 1

## 2020-11-29 MED ORDER — SUGAMMADEX SODIUM 200 MG/2ML IV SOLN
INTRAVENOUS | Status: DC | PRN
  Administered 2020-11-29: 200 mg via INTRAVENOUS

## 2020-11-29 MED ORDER — ONDANSETRON 4 MG PO TBDP: 4.0000 mg | ORAL_TABLET | Freq: Three times a day (TID) | ORAL | Status: DC | PRN

## 2020-11-29 MED ORDER — HYDROCODONE-ACETAMINOPHEN 10-325 MG PO TABS: 2.0000 | ORAL_TABLET | ORAL | Status: DC | PRN

## 2020-11-29 MED ORDER — DEXAMETHASONE SODIUM PHOSPHATE 10 MG/ML IJ SOLN
10.0000 mg | Freq: Once | INTRAMUSCULAR | Status: AC
  Administered 2020-11-29: 10 mg via INTRAVENOUS

## 2020-11-29 MED ORDER — OXYCODONE HCL 5 MG/5ML PO SOLN: 5.0000 mg | Freq: Once | ORAL | Status: DC | PRN

## 2020-11-29 MED ORDER — MENTHOL 3 MG MT LOZG: 1.0000 | LOZENGE | OROMUCOSAL | Status: DC | PRN

## 2020-11-29 MED ORDER — ORAL CARE MOUTH RINSE: 15.0000 mL | Freq: Once | OROMUCOSAL | Status: AC

## 2020-11-29 MED ORDER — ACETAMINOPHEN 500 MG PO TABS: 1000.0000 mg | ORAL_TABLET | Freq: Once | ORAL | Status: DC

## 2020-11-29 MED ORDER — HYDROCODONE-ACETAMINOPHEN 5-325 MG PO TABS
1.0000 | ORAL_TABLET | ORAL | Status: DC | PRN
  Administered 2020-11-30: 1 via ORAL
  Filled 2020-11-29: qty 1

## 2020-11-29 MED ORDER — CARVEDILOL 12.5 MG PO TABS
12.5000 mg | ORAL_TABLET | Freq: Two times a day (BID) | ORAL | Status: DC
  Administered 2020-11-30: 12.5 mg via ORAL
  Filled 2020-11-29: qty 1

## 2020-11-29 MED ORDER — THROMBIN 5000 UNITS EX SOLR
CUTANEOUS | Status: DC | PRN
  Administered 2020-11-29: 10000 [IU] via TOPICAL

## 2020-11-29 MED ORDER — ROCURONIUM BROMIDE 10 MG/ML (PF) SYRINGE
PREFILLED_SYRINGE | INTRAVENOUS | Status: AC
  Filled 2020-11-29: qty 10

## 2020-11-29 MED ORDER — LACTATED RINGERS IV SOLN: INTRAVENOUS | Status: DC | PRN

## 2020-11-29 MED ORDER — FAMOTIDINE 20 MG PO TABS
20.0000 mg | ORAL_TABLET | Freq: Every day | ORAL | Status: DC
  Administered 2020-11-29 – 2020-11-30 (×2): 20 mg via ORAL
  Filled 2020-11-29 (×2): qty 1

## 2020-11-29 MED ORDER — LUBIPROSTONE 24 MCG PO CAPS
24.0000 ug | ORAL_CAPSULE | Freq: Two times a day (BID) | ORAL | Status: DC
  Administered 2020-11-30: 24 ug via ORAL
  Filled 2020-11-29 (×2): qty 1

## 2020-11-29 MED ORDER — PROSIGHT PO TABS
1.0000 | ORAL_TABLET | Freq: Every day | ORAL | Status: DC
  Administered 2020-11-30: 1 via ORAL
  Filled 2020-11-29 (×2): qty 1

## 2020-11-29 MED ORDER — LACTATED RINGERS IV SOLN: INTRAVENOUS | Status: DC

## 2020-11-29 MED ORDER — OCUVITE-LUTEIN PO CAPS
1.0000 | ORAL_CAPSULE | Freq: Every day | ORAL | Status: DC
  Filled 2020-11-29: qty 1

## 2020-11-29 MED ORDER — GALCANEZUMAB-GNLM 120 MG/ML ~~LOC~~ SOAJ: 120.0000 mg | SUBCUTANEOUS | Status: DC

## 2020-11-29 MED ORDER — VITAMIN B-12 1000 MCG PO TABS
5000.0000 ug | ORAL_TABLET | Freq: Every day | ORAL | Status: DC
  Administered 2020-11-30: 5000 ug via ORAL
  Filled 2020-11-29: qty 5

## 2020-11-29 MED ORDER — THROMBIN 5000 UNITS EX SOLR
CUTANEOUS | Status: AC
  Filled 2020-11-29: qty 10000

## 2020-11-29 MED ORDER — PROPOFOL 10 MG/ML IV BOLUS
INTRAVENOUS | Status: DC | PRN
  Administered 2020-11-29: 130 mg via INTRAVENOUS

## 2020-11-29 MED ORDER — FENTANYL CITRATE (PF) 250 MCG/5ML IJ SOLN
INTRAMUSCULAR | Status: DC | PRN
  Administered 2020-11-29: 100 ug via INTRAVENOUS
  Administered 2020-11-29 (×3): 50 ug via INTRAVENOUS

## 2020-11-29 MED ORDER — AMITRIPTYLINE HCL 25 MG PO TABS
25.0000 mg | ORAL_TABLET | Freq: Every day | ORAL | Status: DC
Start: 2020-11-29 — End: 2020-11-30
  Administered 2020-11-29: 25 mg via ORAL
  Filled 2020-11-29 (×2): qty 1

## 2020-11-29 MED ORDER — VANCOMYCIN HCL IN DEXTROSE 1-5 GM/200ML-% IV SOLN: 1000.0000 mg | INTRAVENOUS | Status: AC

## 2020-11-29 MED ORDER — PRAMIPEXOLE DIHYDROCHLORIDE 0.25 MG PO TABS: 0.1250 mg | ORAL_TABLET | Freq: Every evening | ORAL | Status: DC | PRN

## 2020-11-29 MED ORDER — PHENOL 1.4 % MT LIQD: 1.0000 | OROMUCOSAL | Status: DC | PRN

## 2020-11-29 MED ORDER — ONDANSETRON HCL 4 MG/2ML IJ SOLN
INTRAMUSCULAR | Status: AC
  Filled 2020-11-29: qty 2

## 2020-11-29 MED ORDER — DIPHENHYDRAMINE HCL 50 MG/ML IJ SOLN: 12.5000 mg | Freq: Once | INTRAMUSCULAR | Status: AC

## 2020-11-29 MED ORDER — OXYCODONE HCL 5 MG PO TABS: 5.0000 mg | ORAL_TABLET | Freq: Once | ORAL | Status: DC | PRN

## 2020-11-29 MED ORDER — CHLORHEXIDINE GLUCONATE 0.12 % MT SOLN
OROMUCOSAL | Status: AC
  Administered 2020-11-29: 15 mL via OROMUCOSAL
  Filled 2020-11-29: qty 15

## 2020-11-29 MED ORDER — FENTANYL CITRATE (PF) 100 MCG/2ML IJ SOLN
25.0000 ug | INTRAMUSCULAR | Status: DC | PRN
  Administered 2020-11-29: 50 ug via INTRAVENOUS

## 2020-11-29 MED ORDER — SODIUM CHLORIDE 0.9% FLUSH: 3.0000 mL | INTRAVENOUS | Status: DC | PRN

## 2020-11-29 MED ORDER — VITAMIN D 25 MCG (1000 UNIT) PO TABS
5000.0000 [IU] | ORAL_TABLET | Freq: Every evening | ORAL | Status: DC
  Filled 2020-11-29: qty 5

## 2020-11-29 MED ORDER — ONDANSETRON HCL 4 MG/2ML IJ SOLN
INTRAMUSCULAR | Status: DC | PRN
  Administered 2020-11-29: 4 mg via INTRAVENOUS

## 2020-11-29 MED ORDER — FUROSEMIDE 20 MG PO TABS
20.0000 mg | ORAL_TABLET | Freq: Every day | ORAL | Status: DC
  Administered 2020-11-30: 20 mg via ORAL
  Filled 2020-11-29: qty 1

## 2020-11-29 MED ORDER — MEGARED OMEGA-3 KRILL OIL 500 MG PO CAPS: ORAL_CAPSULE | Freq: Every day | ORAL | Status: DC

## 2020-11-29 MED ORDER — METHENAMINE HIPPURATE 1 G PO TABS
0.5000 g | ORAL_TABLET | Freq: Every day | ORAL | Status: DC
Start: 2020-11-29 — End: 2020-11-30

## 2020-11-29 MED ORDER — ATORVASTATIN CALCIUM 40 MG PO TABS
40.0000 mg | ORAL_TABLET | Freq: Every day | ORAL | Status: DC
  Administered 2020-11-30: 40 mg via ORAL
  Filled 2020-11-29: qty 1

## 2020-11-29 MED ORDER — LIDOCAINE 2% (20 MG/ML) 5 ML SYRINGE
INTRAMUSCULAR | Status: DC | PRN
  Administered 2020-11-29: 80 mg via INTRAVENOUS

## 2020-11-29 MED ORDER — ACETAMINOPHEN 650 MG RE SUPP: 650.0000 mg | RECTAL | Status: DC | PRN

## 2020-11-29 MED ORDER — VITAMIN B-12 1000 MCG PO TABS
5000.0000 ug | ORAL_TABLET | Freq: Every day | ORAL | Status: DC
  Filled 2020-11-29: qty 5

## 2020-11-29 MED ORDER — HYDROCODONE-ACETAMINOPHEN 10-325 MG PO TABS
1.0000 | ORAL_TABLET | ORAL | Status: DC | PRN
  Administered 2020-11-29 – 2020-11-30 (×2): 1 via ORAL
  Filled 2020-11-29 (×2): qty 1

## 2020-11-29 MED ORDER — PROMETHAZINE HCL 25 MG/ML IJ SOLN: 6.2500 mg | INTRAMUSCULAR | Status: DC | PRN

## 2020-11-29 MED ORDER — ACETAMINOPHEN 325 MG PO TABS: 650.0000 mg | ORAL_TABLET | ORAL | Status: DC | PRN

## 2020-11-29 SURGICAL SUPPLY — 53 items
APL SKNCLS STERI-STRIP NONHPOA (GAUZE/BANDAGES/DRESSINGS) ×1
BAG DECANTER FOR FLEXI CONT (MISCELLANEOUS) ×3 IMPLANT
BAND INSRT 18 STRL LF DISP RB (MISCELLANEOUS) ×2
BAND RUBBER #18 3X1/16 STRL (MISCELLANEOUS) ×6 IMPLANT
BENZOIN TINCTURE PRP APPL 2/3 (GAUZE/BANDAGES/DRESSINGS) ×3 IMPLANT
BIT DRILL 13 (BIT) ×1 IMPLANT
BIT DRILL 13MM (BIT) ×1
BUR MATCHSTICK NEURO 3.0 LAGG (BURR) ×3 IMPLANT
CAGE PEEK 8X14X11 (Cage) ×2 IMPLANT
CANISTER SUCT 3000ML PPV (MISCELLANEOUS) ×3 IMPLANT
CARTRIDGE OIL MAESTRO DRILL (MISCELLANEOUS) ×1 IMPLANT
CLOSURE WOUND 1/2 X4 (GAUZE/BANDAGES/DRESSINGS) ×1
COVER WAND RF STERILE (DRAPES) ×3 IMPLANT
DIFFUSER DRILL AIR PNEUMATIC (MISCELLANEOUS) ×3 IMPLANT
DRAPE C-ARM 42X72 X-RAY (DRAPES) ×6 IMPLANT
DRAPE LAPAROTOMY 100X72 PEDS (DRAPES) ×3 IMPLANT
DRAPE MICROSCOPE LEICA (MISCELLANEOUS) ×3 IMPLANT
DURAPREP 6ML APPLICATOR 50/CS (WOUND CARE) ×3 IMPLANT
ELECT COATED BLADE 2.86 ST (ELECTRODE) ×3 IMPLANT
ELECT REM PT RETURN 9FT ADLT (ELECTROSURGICAL) ×3
ELECTRODE REM PT RTRN 9FT ADLT (ELECTROSURGICAL) ×1 IMPLANT
GAUZE 4X4 16PLY RFD (DISPOSABLE) IMPLANT
GAUZE SPONGE 4X4 12PLY STRL (GAUZE/BANDAGES/DRESSINGS) ×3 IMPLANT
GAUZE SPONGE 4X4 12PLY STRL LF (GAUZE/BANDAGES/DRESSINGS) ×2 IMPLANT
GLOVE ECLIPSE 9.0 STRL (GLOVE) ×3 IMPLANT
GLOVE EXAM NITRILE XL STR (GLOVE) IMPLANT
GOWN STRL REUS W/ TWL LRG LVL3 (GOWN DISPOSABLE) IMPLANT
GOWN STRL REUS W/ TWL XL LVL3 (GOWN DISPOSABLE) IMPLANT
GOWN STRL REUS W/TWL 2XL LVL3 (GOWN DISPOSABLE) IMPLANT
GOWN STRL REUS W/TWL LRG LVL3 (GOWN DISPOSABLE)
GOWN STRL REUS W/TWL XL LVL3 (GOWN DISPOSABLE)
HALTER HD/CHIN CERV TRACTION D (MISCELLANEOUS) ×3 IMPLANT
HEMOSTAT POWDER KIT SURGIFOAM (HEMOSTASIS) IMPLANT
KIT BASIN OR (CUSTOM PROCEDURE TRAY) ×3 IMPLANT
KIT TURNOVER KIT B (KITS) ×3 IMPLANT
NDL SPNL 20GX3.5 QUINCKE YW (NEEDLE) ×1 IMPLANT
NEEDLE SPNL 20GX3.5 QUINCKE YW (NEEDLE) ×3 IMPLANT
NS IRRIG 1000ML POUR BTL (IV SOLUTION) ×3 IMPLANT
OIL CARTRIDGE MAESTRO DRILL (MISCELLANEOUS) ×3
PACK LAMINECTOMY NEURO (CUSTOM PROCEDURE TRAY) ×3 IMPLANT
PAD ARMBOARD 7.5X6 YLW CONV (MISCELLANEOUS) ×9 IMPLANT
PLATE ELITE VISION 25MM (Plate) ×2 IMPLANT
SCREW ST FIX 4 ATL 3120213 (Screw) ×8 IMPLANT
SPONGE INTESTINAL PEANUT (DISPOSABLE) ×3 IMPLANT
SPONGE SURGIFOAM ABS GEL SZ50 (HEMOSTASIS) ×3 IMPLANT
STRIP CLOSURE SKIN 1/2X4 (GAUZE/BANDAGES/DRESSINGS) ×2 IMPLANT
SUT VIC AB 3-0 SH 8-18 (SUTURE) ×3 IMPLANT
SUT VIC AB 4-0 RB1 18 (SUTURE) ×3 IMPLANT
TAPE CLOTH 4X10 WHT NS (GAUZE/BANDAGES/DRESSINGS) ×3 IMPLANT
TOWEL GREEN STERILE (TOWEL DISPOSABLE) ×3 IMPLANT
TOWEL GREEN STERILE FF (TOWEL DISPOSABLE) ×3 IMPLANT
TRAP SPECIMEN MUCUS 40CC (MISCELLANEOUS) ×3 IMPLANT
WATER STERILE IRR 1000ML POUR (IV SOLUTION) ×3 IMPLANT

## 2020-11-29 NOTE — Progress Notes (Signed)
Pharmacy Antibiotic Note  [name] is a 86 y.o. female admitted on 11/29/2020 for C3-4 anterior cervical discectomy with interbody fusion.  Pharmacy has been consulted for vancomycin for post-op prophylaxis.   Currently afebrile, no labs this admit. Normal scr on 2/4.   Plan: Vancomycin 1g given pre-op, will give another 750 in am No drain present so will sign off  Height: 5\' 4"  (162.6 cm) Weight: 77.1 kg (169 lb 15.6 oz) IBW/kg (Calculated) : 54.7  Temp (24hrs), Avg:97.8 F (36.6 C), Min:97.8 F (36.6 C), Max:97.9 F (36.6 C)  Recent Labs  Lab 11/25/20 1151  WBC 4.1  CREATININE 1.06*    Estimated Creatinine Clearance: 40.4 mL/min (A) (by C-G formula based on SCr of 1.06 mg/dL (H)).    Allergies  Allergen Reactions  . Cymbalta [Duloxetine Hcl] Swelling    Swelling in legs  . Gabapentin Swelling    Swelling in legs Swelling in legs  . Silicone Hives, Itching, Dermatitis and Rash  . Metformin And Related Other (See Comments)    dizzy, tired, chills, diarrhea, and nausea  . Adhesive [Tape] Rash    rash rash  . Cefazolin Hives    Hives Hives   . Ciprofloxacin Other (See Comments)    Other reaction(s): Other (See Comments) Per pt, caused body aches Per pt, caused body aches   . Clorazepate Dipotassium Other (See Comments)    Unknown reaction  . Dilaudid [Hydromorphone Hcl] Other (See Comments)     confused, intense itching  . Doxycycline Rash  . Enablex [Darifenacin Hydrobromide Er] Other (See Comments)    Hypotension, near syncope  . Levofloxacin Other (See Comments)    Causes wrist pain  . Lyrica [Pregabalin] Swelling    Swelling in legs  . Methadone Hcl Other (See Comments)    Reaction unknown  . Morphine And Related Other (See Comments)    Confusion, constipation.   Loma Messing [Pentazocine] Other (See Comments)    "climbing walls" anxiety   Erin Hearing PharmD., BCPS Clinical Pharmacist 11/29/2020 7:30 PM

## 2020-11-29 NOTE — Brief Op Note (Signed)
11/29/2020  5:18 PM  PATIENT:  Gretel Acre  86 y.o. female  PRE-OPERATIVE DIAGNOSIS:  Stenosis  POST-OPERATIVE DIAGNOSIS:  Stenosis  PROCEDURE:  Procedure(s) with comments: Anterior Cervical Decompression Fusion - Cervical Three-Cerivcal Four (N/A) - 3C  SURGEON:  Surgeon(s) and Role:    * Earnie Larsson, MD - Primary  PHYSICIAN ASSISTANT:   ASSISTANTSMearl Latin   ANESTHESIA:   general  EBL:  50 mL   BLOOD ADMINISTERED:none  DRAINS: none   LOCAL MEDICATIONS USED:  NONE  SPECIMEN:  No Specimen  DISPOSITION OF SPECIMEN:  N/A  COUNTS:  YES  TOURNIQUET:  * No tourniquets in log *  DICTATION: .Dragon Dictation  PLAN OF CARE: Admit for overnight observation  PATIENT DISPOSITION:  PACU - hemodynamically stable.   Delay start of Pharmacological VTE agent (>24hrs) due to surgical blood loss or risk of bleeding: yes

## 2020-11-29 NOTE — H&P (Signed)
[name] is an 86 y.o. female.   Chief Complaint: Neck pain HPI: 86 year old female with progressive neck pain with radiation to both upper extremities with associated decreased sensation and motor coordination.  Patient nonambulatory secondary to prior below the knee amputation but this is beginning to affect her lower extremity function is well.  Work-up demonstrates evidence of prior anterior cervical fusion from C4-C6 which appears to have healed well.  The patient has evidence of progressive adjacent level degeneration with degenerative spondylolisthesis and severe stenosis at C3-4.  Patient presents now for anterior cervical decompression and fusion for treatment of her severe compressive myelopathy.  Past Medical History:  Diagnosis Date  . Allergic rhinitis   . Arthritis    "back; ankles; hands; knees" (10/31/2013)  . Bilateral sensorineural hearing loss   . CAD (coronary artery disease)    a. Nonobst in 2011. b. Abnormal nuc 09/2013 -> s/p cutting balloon to D2, mild LAD disease; c. 08/2015 MV: no ischemia, EF 71%.  . Carcinoma in situ in a polyp 1994   a. 1994 - malignant polyp removed during colonoscopy.  . Chronic diastolic CHF (congestive heart failure) ([place])    a. 09/2013 Echo: EF 60-65%, mild LVH, Gr1 DD.  Marland Kitchen Dementia ([place])   . Diverticulosis   . DJD (degenerative joint disease)   . Fatty liver   . GERD (gastroesophageal reflux disease)    a. Hx GERD/esophageal dysmotility followed by Dr. Olevia Perches.   Marland Kitchen Hemorrhoids   . Hiatal hernia    a. s/p Nissen fundoplication 7782.  Marland Kitchen Hyperlipidemia    a. patient unwilling to use statins.  . Hypertensive heart disease   . IBS (irritable bowel syndrome)   . Macular degeneration 03/2009   Dr. Rosana Hoes  . Neuropathy    a. Hands, feet, legs.  . Orthostatic hypotension   . Osteomyelitis ([place])    a. 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 01/2016 s/p RLE transtibial amputation 2/2  Charcot rocker-bottom deformity and insensate neuropathy ulceration.  . Osteoporosis   . PE (pulmonary embolism) 12/2007   a. PE/DVT after neck surgery 2009. b. coumadin d/c 10-2008.  Marland Kitchen PONV (postoperative nausea and vomiting) 2009   neck surgery  . PPD positive   . Recurrent UTI   . RSD (reflex sympathetic dystrophy)    a. Chronic pain.  Marland Kitchen Spinal stenosis   . Type II diabetes mellitus (HCC)    no on medication  . Venous insufficiency    a. Contributing to LEE.    Past Surgical History:  Procedure Laterality Date  . AMPUTATION  03/06/2012   Procedure: AMPUTATION FOOT;  Surgeon: Newt Minion, MD;  Location: [place];  Service: Orthopedics;  Laterality: Left;  FIFTH RAY AMPUTATION   . AMPUTATION Right 02/18/2016   Procedure: AMPUTATION BELOW KNEE;  Surgeon: Newt Minion, MD;  Location: [place];  Service: Orthopedics;  Laterality: Right;  . AMPUTATION Left 11/22/2016   Procedure: Amputation 4th Toe Left Foot at Metatarsophalangeal Joint;  Surgeon: Newt Minion, MD;  Location: [place];  Service: Orthopedics;  Laterality: Left;  . ANKLE FUSION  09/27/2012   Procedure: ANKLE FUSION;  Surgeon: Newt Minion, MD;  Location: [place];  Service: Orthopedics;  Laterality: Left;  Left Tibiocalcaneal Fusion  . ANKLE FUSION Right 05/09/2013   Procedure: ANKLE FUSION;  Surgeon: Newt Minion, MD;  Location: [name];  Service: Orthopedics;  Laterality: Right;  Excision Osteomyelitis Base 1st  MT Right Foot, Fusion Medial Column  . ANTERIOR CERVICAL DECOMP/DISCECTOMY FUSION  12/18/07   For OA,  Dr. Lorin Mercy:  fu by a PE  . CARDIAC CATHETERIZATION  05/2010    at [facility]  . CARPAL TUNNEL RELEASE Left 09/2011  . CATARACT EXTRACTION W/ INTRAOCULAR LENS  IMPLANT, BILATERAL  2004   feb 2004 left, aug 2004 right  . CHOLECYSTECTOMY  03/2002  . CORONARY ANGIOPLASTY  10/31/2013  . CORONARY ARTERY BYPASS GRAFT  09/27/2016  . CYST REMOVAL HAND  06/2003  . FOOT BONE EXCISION Right 06/2009  . LAPAROSCOPIC RIGHT HEMI COLECTOMY N/A  01/08/2015   Procedure: LAPAROSCOPIC ASSISTED RIGHT HEMI COLECTOMY;  Surgeon: Johnathan Hausen, MD;  Location: [facility];  Service: General;  Laterality: N/A;  . LEFT HEART CATHETERIZATION WITH CORONARY ANGIOGRAM N/A 10/31/2013   Procedure: LEFT HEART CATHETERIZATION WITH CORONARY ANGIOGRAM;  Surgeon: Jettie Booze, MD;  Location: [facility] CATH LAB;  Service: Cardiovascular;  Laterality: N/A;  . NASAL SEPTUM SURGERY  09/1963  . NISSEN FUNDOPLICATION  [id number]  . PERCUTANEOUS CORONARY INTERVENTION-BALLOON ONLY  10/31/2013   Procedure: PERCUTANEOUS CORONARY INTERVENTION-BALLOON ONLY;  Surgeon: Jettie Booze, MD;  Location: [facility] CATH LAB;  Service: Cardiovascular;;  . ROTATOR CUFF REPAIR Right 07/2007   Dr. Percell Miller  . ROTATOR CUFF REPAIR Left 11/2004  . stent in left leg, behind knee  06/27/2017   x2, done at [facility]ology in [facility]  . TOE AMPUTATION Right 08/2008   3rd toe, Dr. Sharol Given due to osteomyelitis  . VAGINAL HYSTERECTOMY  03/1975  . VEIN LIGATION Bilateral 03/1966  . VENA CAVA FILTER PLACEMENT  01/2010   green filter; "due to blood clots"  . WISDOM TOOTH EXTRACTION  06/2007    Family History  Problem Relation Age of Onset  . Heart disease Mother        mitral valve replaced  . Arthritis Mother   . Diabetic kidney disease Daughter   . Diabetes Father   . Stroke Father   . Migraines Sister   . Hypertension Other   . Breast cancer Other   . Colon cancer Neg Hx   . Esophageal cancer Neg Hx   . Stomach cancer Neg Hx   . Rectal cancer Neg Hx    Social History:  reports that she has never smoked. She has never used smokeless tobacco. She reports that she does not drink alcohol and does not use drugs.  Allergies:  Allergies  Allergen Reactions  . Cymbalta [Duloxetine Hcl] Swelling    Swelling in legs  . Gabapentin Swelling    Swelling in legs Swelling in legs  . Silicone Hives, Itching, Dermatitis and Rash  . Metformin And Related Other (See Comments)    dizzy, tired,  chills, diarrhea, and nausea  . Adhesive [Tape] Rash    rash rash  . Cefazolin Hives    Hives Hives   . Ciprofloxacin Other (See Comments)    Other reaction(s): Other (See Comments) Per pt, caused body aches Per pt, caused body aches   . Clorazepate Dipotassium Other (See Comments)    Unknown reaction  . Dilaudid [Hydromorphone Hcl] Other (See Comments)     confused, intense itching  . Doxycycline Rash  . Enablex [Darifenacin Hydrobromide Er] Other (See Comments)    Hypotension, near syncope  . Levofloxacin Other (See Comments)    Causes wrist pain  . Lyrica [Pregabalin] Swelling    Swelling in legs  . Methadone Hcl Other (See Comments)  Reaction unknown  . Morphine And Related Other (See Comments)    Confusion, constipation.   Loma Messing [Pentazocine] Other (See Comments)    "climbing walls" anxiety    Medications Prior to Admission  Medication Sig Dispense Refill  . Alpha-Lipoic Acid 600 MG CAPS Take 1,200 mg by mouth daily.    Marland Kitchen amitriptyline (ELAVIL) 25 MG tablet Take 1 tablet (25 mg total) by mouth at bedtime. 90 tablet 3  . Ascorbic Acid (VITAMIN C WITH ROSE HIPS) 500 MG tablet Take 500 mg by mouth in the morning, at noon, and at bedtime.    Marland Kitchen atorvastatin (LIPITOR) 40 MG tablet Take 40 mg by mouth daily.    . carvedilol (COREG) 12.5 MG tablet Take 12.5 mg by mouth 2 (two) times daily with a meal.    . Cholecalciferol (VITAMIN D3) 5000 UNITS CAPS Take 5,000 Units by mouth every evening. Reported on 11/24/2015    . clopidogrel (PLAVIX) 75 MG tablet Take 75 mg by mouth at bedtime.     . Cyanocobalamin (VITAMIN B-12) 500 MCG SUBL Place 5,000 mcg under the tongue daily.    . diclofenac Sodium (VOLTAREN) 1 % GEL Apply 2-4 g topically 4 (four) times daily as needed. (Patient taking differently: Apply 2-4 g topically 4 (four) times daily as needed (joint pain).) 100 g 5  . dicyclomine (BENTYL) 10 MG capsule Take 1 capsule (10 mg total) by mouth 3 (three) times daily before  meals. 90 capsule 3  . famotidine (PEPCID) 20 MG tablet Take 1 tablet (20 mg total) by mouth 2 (two) times daily. 180 tablet 3  . ferrous sulfate (FEROSUL) 325 (65 FE) MG tablet Take 1 tablet (325 mg total) by mouth 2 (two) times daily with a meal. 180 tablet 1  . furosemide (LASIX) 20 MG tablet Take 20 mg by mouth daily.    . Galcanezumab-gnlm (EMGALITY) 120 MG/ML SOAJ Inject 120 mg into the skin every 30 (thirty) days. 1 mL 11  . glucose blood (FREESTYLE LITE) test strip USE TO CHECK BLOOD SUGAR NO MORE THAN TWICE DAILY 200 each 12  . HYDROcodone-acetaminophen (NORCO) 10-325 MG tablet Take 1 tablet by mouth every 6 (six) hours as needed. (Patient taking differently: Take 1 tablet by mouth every 6 (six) hours as needed for moderate pain.) 120 tablet 0  . Lancets (FREESTYLE) lancets CHECK BLOOD SUGAR TWICE DAILY 200 each 12  . lubiprostone ([name]) 24 MCG capsule Take 1 capsule (24 mcg total) by mouth 2 (two) times daily with a meal. 60 capsule 2  . MEGARED OMEGA-3 KRILL OIL PO Take 1 tablet by mouth daily.    . methenamine (HIPREX) 1 g tablet Take 0.5 tablets by mouth daily.  4  . Multiple Vitamins-Minerals (PRESERVISION AREDS 2) CAPS Take 1 capsule by mouth 2 (two) times daily.    Marland Kitchen omeprazole (PRILOSEC) 20 MG capsule Take 20 mg by mouth 2 (two) times daily before a meal.    . ondansetron (ZOFRAN-ODT) 4 MG disintegrating tablet Take 1 tablet (4 mg total) by mouth every 8 (eight) hours as needed for nausea or vomiting. 20 tablet 0  . OVER THE COUNTER MEDICATION Take 1 capsule by mouth 2 (two) times daily as needed (digestive issues). Integrative Digestive Formula    . OVER THE COUNTER MEDICATION 1 tablet 3 (three) times daily. [place] Gluco Defense    . pramipexole (MIRAPEX) 0.125 MG tablet Take 1-2 tablets (0.125-0.25 mg total) by mouth at bedtime as needed. (Patient taking differently: Take 0.125-0.25  mg by mouth at bedtime as needed (restless leg).) 180 tablet 1  . rivaroxaban (XARELTO) 2.5  MG TABS tablet Take 2.5 mg by mouth 2 (two) times daily.    . vitamin E 180 MG (400 UNITS) capsule Take 400 Units by mouth daily.    Marland Kitchen augmented betamethasone dipropionate (DIPROLENE-AF) 0.05 % cream Apply topically 2 (two) times daily. (Patient not taking: Reported on 11/25/2020) 60 g 0  . ciclopirox (LOPROX) 0.77 % cream Apply 1 application topically 2 (two) times daily as needed (rash).    . hydrocortisone (ANUSOL-HC) 25 MG suppository Place 1 suppository (25 mg total) rectally at bedtime. Do not use for more than 7 days then stop for at least 7 days (Patient not taking: Reported on 11/25/2020) 24 suppository 3  . ketoconazole (NIZORAL) 2 % cream Apply 1 application topically daily. 30 g 0  . lidocaine (LIDODERM) 5 % Place 1 patch onto the skin daily. Remove & Discard patch within 12 hours or as directed by MD (Patient not taking: Reported on 11/25/2020) 30 patch 3  . loperamide (IMODIUM A-D) 2 MG tablet Take 2 mg by mouth as needed for diarrhea or loose stools.    . meclizine (ANTIVERT) 25 MG tablet Take 1 tablet (25 mg total) by mouth 2 (two) times daily as needed for dizziness. (Patient not taking: Reported on 11/25/2020) 180 tablet 0  . naloxone (NARCAN) nasal spray 4 mg/0.1 mL Place 1 spray into the nose once as needed for up to 1 dose. 2 each 0  . nitroGLYCERIN (NITROSTAT) 0.4 MG SL tablet Place 1 tablet (0.4 mg total) under the tongue every 5 (five) minutes x 3 doses as needed for chest pain. 25 tablet 3  . pantoprazole (PROTONIX) 20 MG tablet Take 1 tablet (20 mg total) by mouth daily. (Patient not taking: Reported on 11/25/2020) 30 tablet 1  . Polyvinyl Alcohol-Povidone (REFRESH OP) Place 1 drop into both eyes 3 (three) times daily as needed (dry eyes).       Results for orders placed or performed during the hospital encounter of 11/29/20 (from the past 48 hour(s))  Glucose, capillary     Status: Abnormal   Collection Time: 11/29/20 12:43 PM  Result Value Ref Range   Glucose-Capillary 131 (H) 70  - 99 mg/dL    Comment: Glucose reference range applies only to samples taken after fasting for at least 8 hours.  Glucose, capillary     Status: Abnormal   Collection Time: 11/29/20  2:59 PM  Result Value Ref Range   Glucose-Capillary 105 (H) 70 - 99 mg/dL    Comment: Glucose reference range applies only to samples taken after fasting for at least 8 hours.   Comment 1 Notify RN    Comment 2 Document in Chart    *Note: Due to a large number of results and/or encounters for the requested time period, some results have not been displayed. A complete set of results can be found in Results Review.   No results found.  Pertinent items noted in HPI and remainder of comprehensive ROS otherwise negative.  Blood pressure (!) 142/62, pulse 85, temperature 97.9 F (36.6 C), temperature source Oral, resp. rate 20, height 5\' 4"  (1.626 m), weight 77.1 kg, SpO2 98 %.  Patient is awake and alert.  She is oriented and appropriate.  Cranial nerve function normal bilateral.  Speech is fluent.  Judgment insight intact.  Motor examination extremities reveals weakness of both grips and intrinsics in both upper extremities.  She has somewhat decreased motor control and strength in both lower extremities.  Sensory lamination with patchy distal sensory loss in all extremities.  Reflexes are brisk.  Positive Hoffmann's responses in each hand.  Examination head ears eyes nose and throat is unremarked.  Chest and abdomen are benign.  Extremities reveal evidence of prior right lower extremity amputation. Assessment/Plan C3-4 stenosis with myelopathy.  Plan C3-4 anterior cervical discectomy with interbody fusion utilizing interbody peek cage, local harvested autograft, and anterior plate instrumentation.  Risks and benefits been explained.  Patient wishes to proceed.  [name] 11/29/2020, 3:14 PM

## 2020-11-29 NOTE — Op Note (Signed)
Date of procedure: 11/29/2020  Date of dictation: Same  Service: Neurosurgery  Preoperative diagnosis: C3-4 degenerative spondylolisthesis with severe stenosis and myelopathy.  Postoperative diagnosis: Same  Procedure Name: C3-4 anterior cervical discectomy with interbody fusion utilizing interbody peek cage, locally harvested autograft, and anterior plate instrumentation  Surgeon:Brionna Romanek A.Avante Carneiro, M.D.  Asst. Surgeon: Reinaldo Meeker, NP  Anesthesia: General  Indication: 86 year old female status post prior C4-C6 anterior cervical fusion remotely in the past by outside physician presents now with severe neck and bilateral upper extremity pain paresthesias and weakness.  Work-up demonstrates evidence of significant anterolisthesis of C3 on C4 with severe spinal stenosis and spinal cord compression.  Patient presents now for anterior cervical decompression and fusion in hopes of improving her symptoms.  Operative note: After induction of anesthesia, patient was in supine with neck slightly extended held placed halter traction.  Patient's anterior cervical region prepped and draped sterilely.  Incision made overlying C3-4.  Dissection performed on the right.  Retractor placed.  Fluoroscopy used.  Levels confirmed.  To space incised.  Discectomy perform using various instruments down to level the posterior annulus.  Microscope was then brought to the field used throughout the remainder of the discectomy and remaining aspects of annulus and osteophytes removed down to level the posterior logical limb.  Posterior logical was elevated and resected.  Under line thecal sac was identified.  A wide central decompression then performed by undercutting the bodies of C3 and C4.  Decompression then proceeded each neural foramina.  Wide anterior foraminotomies performed on the course exiting C4 nerve roots bilaterally.  At this point a very thorough decompression of been achieved.  There was no evidence of injury to the  thecal sac or nerve roots.  Wound is then irrigated with antibiotic solution.  Gelfoam was placed topically for hemostasis.  Gelfoam removed.  An 8 mm Medtronic anatomic peek cage was then packed with locally harvested autograft.  This is intact in the place and recessed slightly from the anterior cortical margin.  A 25 mm Atlantis anterior cervical plate was then placed over the C3 and C4 levels.  This attached under fluoroscopic guidance and 13 mm fixed angle screws to each at both levels.  All 4 screws given final tightening found to be solidly within the bone.  Locking screws engaged both levels.  Final images reveal good position of the cages with normal alignment of spine and good appearance of her instrumentation.  Wound is inspected for hemostasis.  Is then closed typical fashion.  Steri-Strips sterile dressing were applied.  No apparent complications.  Patient tolerated the procedure well.  She returns to recovery and postop.

## 2020-11-29 NOTE — Transfer of Care (Signed)
Immediate Anesthesia Transfer of Care Note  Patient: [name]  Procedure(s) Performed: Anterior Cervical Decompression Fusion - Cervical Three-Cerivcal Four (N/A )  Patient Location: PACU  Anesthesia Type:General  Level of Consciousness: drowsy  Airway & Oxygen Therapy: Patient Spontanous Breathing and Patient connected to face mask oxygen  Post-op Assessment: Report given to RN, Post -op Vital signs reviewed and stable and Patient moving all extremities X 4  Post vital signs: Reviewed and stable  Last Vitals:  Vitals Value Taken Time  BP 137/61 11/29/20 1736  Temp 36.6 C 11/29/20 1736  Pulse 72 11/29/20 1741  Resp 20 11/29/20 1741  SpO2 100 % 11/29/20 1741  Vitals shown include unvalidated device data.  Last Pain:  Vitals:   11/29/20 1303  TempSrc:   PainSc: 8       Patients Stated Pain Goal: 3 (17/91/50 5697)  Complications: No complications documented.

## 2020-11-29 NOTE — Anesthesia Procedure Notes (Signed)
Procedure Name: Intubation Date/Time: 11/29/2020 4:18 PM Performed by: Reece Agar, CRNA Pre-anesthesia Checklist: Patient identified, Emergency Drugs available, Suction available and Patient being monitored Patient Re-evaluated:Patient Re-evaluated prior to induction Oxygen Delivery Method: Circle System Utilized Preoxygenation: Pre-oxygenation with 100% oxygen Induction Type: IV induction Ventilation: Mask ventilation without difficulty Laryngoscope Size: Glidescope and 3 Grade View: Grade I Tube type: Oral Tube size: 7.0 mm Number of attempts: 1 Airway Equipment and Method: Stylet Placement Confirmation: ETT inserted through vocal cords under direct vision,  positive ETCO2 and breath sounds checked- equal and bilateral Secured at: 22 cm Tube secured with: Tape Dental Injury: Teeth and Oropharynx as per pre-operative assessment

## 2020-11-29 NOTE — Progress Notes (Signed)
Pt c/o itching to the right forearm from the tape applied during her IV insertion.Tape removed, areas of redness cleansed, and paper tape applied. Dr. Daiva Huge from anesthesia made aware and new orders received.

## 2020-11-29 NOTE — Anesthesia Postprocedure Evaluation (Signed)
Anesthesia Post Note  Patient: [name]  Procedure(s) Performed: Anterior Cervical Decompression Fusion - Cervical Three-Cerivcal Four (N/A )     Patient location during evaluation: PACU Anesthesia Type: General Level of consciousness: awake and alert Pain management: pain level controlled Vital Signs Assessment: post-procedure vital signs reviewed and stable Respiratory status: spontaneous breathing, nonlabored ventilation, respiratory function stable and patient connected to nasal cannula oxygen Cardiovascular status: blood pressure returned to baseline and stable Postop Assessment: no apparent nausea or vomiting Anesthetic complications: no   No complications documented.  Last Vitals:  Vitals:   11/29/20 1836 11/29/20 1901  BP: (!) 159/91 (!) 161/88  Pulse: 94 96  Resp: 14 18  Temp: 36.6 C 36.6 C  SpO2: 98% 99%    Last Pain:  Vitals:   11/29/20 1901  TempSrc: Oral  PainSc:                  Catalina Gravel

## 2020-11-30 ENCOUNTER — Encounter (HOSPITAL_COMMUNITY): Payer: Self-pay | Admitting: Neurosurgery

## 2020-11-30 DIAGNOSIS — M4712 Other spondylosis with myelopathy, cervical region: Secondary | ICD-10-CM | POA: Diagnosis not present

## 2020-11-30 DIAGNOSIS — I5032 Chronic diastolic (congestive) heart failure: Secondary | ICD-10-CM | POA: Diagnosis not present

## 2020-11-30 DIAGNOSIS — I2510 Atherosclerotic heart disease of native coronary artery without angina pectoris: Secondary | ICD-10-CM | POA: Diagnosis not present

## 2020-11-30 DIAGNOSIS — E119 Type 2 diabetes mellitus without complications: Secondary | ICD-10-CM | POA: Diagnosis not present

## 2020-11-30 DIAGNOSIS — I110 Hypertensive heart disease with heart failure: Secondary | ICD-10-CM | POA: Diagnosis not present

## 2020-11-30 DIAGNOSIS — F0390 Unspecified dementia without behavioral disturbance: Secondary | ICD-10-CM | POA: Diagnosis not present

## 2020-11-30 LAB — GLUCOSE, CAPILLARY: Glucose-Capillary: 204 mg/dL — ABNORMAL HIGH (ref 70–99)

## 2020-11-30 MED ORDER — RIVAROXABAN 2.5 MG PO TABS: 2.5000 mg | ORAL_TABLET | Freq: Two times a day (BID) | ORAL | Status: AC

## 2020-11-30 MED ORDER — MEGARED OMEGA-3 KRILL OIL 500 MG PO CAPS: ORAL_CAPSULE | ORAL | Status: DC

## 2020-11-30 MED ORDER — CYCLOBENZAPRINE HCL 10 MG PO TABS: 10.0000 mg | ORAL_TABLET | Freq: Three times a day (TID) | ORAL | 0 refills | Status: DC | PRN

## 2020-11-30 MED ORDER — CLOPIDOGREL BISULFATE 75 MG PO TABS: 75.0000 mg | ORAL_TABLET | Freq: Every day | ORAL | Status: AC

## 2020-11-30 MED ORDER — HYDROCODONE-ACETAMINOPHEN 10-325 MG PO TABS: 1.0000 | ORAL_TABLET | ORAL | 0 refills | Status: DC | PRN

## 2020-11-30 NOTE — Evaluation (Signed)
Physical Therapy Evaluation Patient Details Name: [name] MRN: [id number] DOB: 10/02/1935 Today's Date: 11/30/2020   History of Present Illness  86 y.o. female presenting with C3-4 degenerative spondylolisthesis with severe stenosis and myelopathy s/p ACDF on 2/7 by Dr. Annette Stable. PMHx significant for OA, CAD, dementia, HLD, macular degeneration, orthostatic hypotension, and DMII.    Clinical Impression  Pt admitted with above diagnosis. At the time of PT eval, pt was able to demonstrate transfers and ambulation with gross min guard assist to supervision for safety. Pt with difficulty sequencing to don brace and prosthetic and required assist from therapist for correct application of both. Pt was educated on precautions however with poor carryover. Pt frequently attempting to bend over throughout session requiring multimodal cues to correct, and at end of session pt reports increased cervical pain. Pt anticipates d/c home with her son, Jacqulynn Cadet who is able to provided 24 hour assistance. Pt currently with functional limitations due to the deficits listed below (see PT Problem List). Pt will benefit from skilled PT to increase their independence and safety with mobility to allow discharge to the venue listed below.      Follow Up Recommendations Home health PT;Supervision/Assistance - 24 hour ([facility] if she is able to get it)    Equipment Recommendations  None recommended by PT    Recommendations for Other Services       Precautions / Restrictions Precautions Precautions: Back;Fall Precaution Booklet Issued: Yes (comment) Precaution Comments: Review cervical precautions. Patient needs heavy cues for adherence during ADLs and functional transfers. Required Braces or Orthoses: Cervical Brace Cervical Brace: Soft collar;At all times Restrictions Weight Bearing Restrictions: No      Mobility  Bed Mobility Overal bed mobility: Needs Assistance Bed Mobility: Supine to Sit     Supine to  sit: Supervision     General bed mobility comments: Increased time to scoot around to EOB. pt in long sitting initially and swung legs over to EOB. VC's for log roll technique.    Transfers Overall transfer level: Needs assistance Equipment used: Rolling walker (2 wheeled) Transfers: Sit to/from [facility] Sit to Stand: Min assist Stand pivot transfers: Min guard       General transfer comment: Min A for power negotiation with sit to stand from EOB. Cues for upright posture and adherence to cervical precautions. Min guard assist provided for SPT around to wheelchair.  Ambulation/Gait             General Gait Details: Did not progress to gait training this session.  Stairs            Information systems manager mobility: Yes Wheelchair propulsion: Right upper extremity;Both lower extermities Wheelchair parts: Supervision/cueing Distance: 8 Wheelchair Assistance Details (indicate cue type and reason): Supervision for wheelchair negotiation around room. With prosthetic donned, pt was able to utilize LE's for wheelchair advancement as well.  Modified Rankin (Stroke Patients Only)       Balance Overall balance assessment: Needs assistance Sitting-balance support: No upper extremity supported;Feet supported Sitting balance-Leahy Scale: Fair Sitting balance - Comments: Able to don LB clothing without overt LOB. Cues for adherence to back precautions.   Standing balance support: Single extremity supported;Bilateral upper extremity supported;During functional activity Standing balance-Leahy Scale: Poor Standing balance comment: Reliant on at least unilateral UE support. Min guard to maintain static standing balance during LB dressing tasks in standing.  Pertinent Vitals/Pain Pain Assessment: Faces Faces Pain Scale: Hurts little more Pain Location: Anterior cervical neck (incision) Pain  Descriptors / Indicators: Sore;Operative site guarding Pain Intervention(s): Limited activity within patient's tolerance;Monitored during session;Repositioned    Home Living Family/patient expects to be discharged to:: Private residence Living Arrangements: Children Available Help at Discharge: Family;Available 24 hours/day (Son [place]) Type of Home: House Home Access: Level entry     Home Layout: One level Home Equipment: Clinical cytogeneticist - 2 wheels;Wheelchair - manual      Prior Function Level of Independence: Needs assistance   Gait / Transfers Assistance Needed: Patient reports use of wc primarily for mobility with occasional use of RW.  ADL's / Homemaking Assistance Needed: Patient reports assist from son for most ADLs. Patient is able to don LLE brace and RLE prosthetic with cues for sequencing this date.  Comments: Patient is a poor historian with Hx of dementia. No family present at time of evaluation. Will need to confirm information.     Hand Dominance   Dominant Hand: Right    Extremity/Trunk Assessment   Upper Extremity Assessment Upper Extremity Assessment: Defer to OT evaluation    Lower Extremity Assessment Lower Extremity Assessment: RLE deficits/detail;LLE deficits/detail RLE Deficits / Details: Prior BKA with prosthesis LLE Deficits / Details: AFO    Cervical / Trunk Assessment Cervical / Trunk Assessment: Kyphotic;Other exceptions Cervical / Trunk Exceptions: s/p cervical surgery.  Communication   Communication: Other (comment) (Tangential in speech)  Cognition Arousal/Alertness: Awake/alert Behavior During Therapy: Anxious (Emotionally labile) Overall Cognitive Status: History of cognitive impairments - at baseline                                 General Comments: Patient oriented to name/DOB, place and situation. Patient with poor memory at baseline 2/2 Hx of dementia. Requires heavy cueing to sequence familiar tasks.       General Comments      Exercises     Assessment/Plan    PT Assessment Patient needs continued PT services  PT Problem List Decreased strength;Decreased activity tolerance;Decreased balance;Decreased mobility;Decreased knowledge of use of DME;Decreased safety awareness;Decreased knowledge of precautions;Pain       PT Treatment Interventions DME instruction;Gait training;Functional mobility training;Therapeutic activities;Therapeutic exercise;Neuromuscular re-education;Patient/family education;Cognitive remediation    PT Goals (Current goals can be found in the Care Plan section)  Acute Rehab PT Goals Patient Stated Goal: To return home with son. PT Goal Formulation: With patient Time For Goal Achievement: 12/07/20 Potential to Achieve Goals: Good    Frequency Min 5X/week   Barriers to discharge        Co-evaluation               AM-PAC PT "6 Clicks" Mobility  Outcome Measure Help needed turning from your back to your side while in a flat bed without using bedrails?: None Help needed moving from lying on your back to sitting on the side of a flat bed without using bedrails?: None Help needed moving to and from a bed to a chair (including a wheelchair)?: A Little Help needed standing up from a chair using your arms (e.g., wheelchair or bedside chair)?: A Little Help needed to walk in hospital room?: A Little Help needed climbing 3-5 steps with a railing? : Total 6 Click Score: 18    End of Session Equipment Utilized During Treatment: Gait belt;Other (comment) (R prosthesis) Activity Tolerance: Patient tolerated treatment well Patient left:  Other (comment) (In wheelchair with OT present) Nurse Communication: Mobility status PT Visit Diagnosis: Unsteadiness on feet (R26.81);Pain Pain - part of body:  (neck)    Time: 6922-3009 PT Time Calculation (min) (ACUTE ONLY): 36 min   Charges:   PT Evaluation $PT Eval Moderate Complexity: 1 Mod PT Treatments $Gait  Training: 8-22 mins        Rolinda Roan, PT, DPT Acute Rehabilitation Services Pager: [phone] Office: [phone]   Thelma Comp 11/30/2020, 10:29 AM

## 2020-11-30 NOTE — Evaluation (Signed)
Occupational Therapy Evaluation Patient Details Name: [name] MRN: [id number] DOB: 08-May-1935 Today's Date: 11/30/2020    History of Present Illness 86 y.o. female presenting with C3-4 degenerative spondylolisthesis with severe stenosis and myelopathy s/p ACDF on 2/7 by Dr. Annette Stable. PMHx significant for OA, CAD, dementia, HLD, macular degeneration, orthostatic hypotension, and DMII.   Clinical Impression   PTA patient was living with her son in a private residence and was requiring some assist with all ADLs. Patient is a poor historian at baseline 2/2 Hx of dementia seemingly exacerbated by anesthesia. Patient tangential in speech and tearful throughout evaluation but able to provide some PLOF and home set-up. No family present at bedside to confirm information. Patient currently requiring set-up A for UB ADLs, Min A grossly for LB ADLs, and Min guard grossly for short-distance household mobility with heavy cueing for adherence to cervical precautions. Patient also able to self-propel manual wc present in hospital room. Patient would benefit from further patient/family education to maximize safety and independence with self-care tasks and decrease caregiver burden in prep for safe d/c home with HHOT. Per patient report, son able to provide 24hr supervision/assist post d/c. Will need to confirm this information.     Follow Up Recommendations  Home health OT;Supervision/Assistance - 24 hour    Equipment Recommendations  3 in 1 bedside commode    Recommendations for Other Services       Precautions / Restrictions Precautions Precautions: Back;Fall Precaution Booklet Issued: Yes (comment) Precaution Comments: Review cervical precautions. Patient needs heavy cues for adherence during ADLs and functional transfers. Required Braces or Orthoses: Cervical Brace Cervical Brace: Soft collar;At all times Restrictions Weight Bearing Restrictions: No      Mobility Bed Mobility Overal bed  mobility: Needs Assistance             General bed mobility comments: Seated EOB with PT upon entry.    Transfers Overall transfer level: Needs assistance Equipment used: Rolling walker (2 wheeled) Transfers: Sit to/from [facility] Sit to Stand: Min assist Stand pivot transfers: Min guard       General transfer comment: Min A for power negotiation with sit to stand from wc. Cues for upright posture and adherence to back precautions. Min guard for for stand-pivot transfer to recliner.    Balance Overall balance assessment: Needs assistance Sitting-balance support: No upper extremity supported;Feet supported Sitting balance-Leahy Scale: Fair Sitting balance - Comments: Able to don LB clothing without overt LOB. Cues for adherence to back precautions.   Standing balance support: Single extremity supported;Bilateral upper extremity supported;During functional activity Standing balance-Leahy Scale: Poor Standing balance comment: Reliant on at least unilateral UE support. Min guard to maintain static standing balance during LB dressing tasks in standing.                           ADL either performed or assessed with clinical judgement   ADL Overall ADL's : Needs assistance/impaired                 Upper Body Dressing : Set up;Sitting Upper Body Dressing Details (indicate cue type and reason): Able to don UB clothing in sitting with set-up assist. Lower Body Dressing: Minimal assistance;Sit to/from stand Lower Body Dressing Details (indicate cue type and reason): Min A and cues to sequence donning of LLE brace and RLE prosthetic (per PT). Patient able to thread BLE and LB clothing over hips in standing with Min guard to  steady. Heavy cues for adherence to cervical precautions. Toilet Transfer: Designer, television/film set Details (indicate cue type and reason): Simulated with transfer to recliner with RW and Min guard. Cues for walker managment  and hand placement.         Functional mobility during ADLs: Min guard;Rolling walker General ADL Comments: Short-distance mobility with use of RW and Min guard for steadying/safety.     Vision Baseline Vision/History: Wears glasses Wears Glasses: At all times Patient Visual Report: No change from baseline Vision Assessment?: No apparent visual deficits     Perception     Praxis      Pertinent Vitals/Pain Pain Assessment: Faces Faces Pain Scale: Hurts little more Pain Location: Anterior cervical neck (incision) Pain Descriptors / Indicators: Sore Pain Intervention(s): Monitored during session;Repositioned     Hand Dominance Right   Extremity/Trunk Assessment Upper Extremity Assessment Upper Extremity Assessment: Generalized weakness   Lower Extremity Assessment Lower Extremity Assessment: Defer to PT evaluation   Cervical / Trunk Assessment Cervical / Trunk Assessment: Kyphotic;Other exceptions Cervical / Trunk Exceptions: s/p cervical surgery.   Communication Communication Communication: Other (comment) (Tangential in speech)   Cognition Arousal/Alertness: Awake/alert Behavior During Therapy: Anxious (Tearful) Overall Cognitive Status: History of cognitive impairments - at baseline                                 General Comments: Patient oriented to name/DOB, place and situation. Patient with poor memory at baseline 2/2 Hx of dementia. Requires heavy cueing to sequence familiar tasks.   General Comments       Exercises     Shoulder Instructions      Home Living Family/patient expects to be discharged to:: Private residence Living Arrangements: Children Available Help at Discharge: Family;Available 24 hours/day (Son [place]) Type of Home: House Home Access: Level entry     Home Layout: One level     Bathroom Shower/Tub: Occupational psychologist: Standard     Home Equipment: Clinical cytogeneticist - 2 wheels;Wheelchair -  manual          Prior Functioning/Environment Level of Independence: Needs assistance  Gait / Transfers Assistance Needed: Patient reports use of wc primarily for mobility with occasional use of RW. ADL's / Homemaking Assistance Needed: Patient reports assist from son for most ADLs. Patient is able to don LLE brace and RLE prosthetic with cues for sequencing this date.   Comments: Patient is a poor historian with Hx of dementia. No family present at time of evaluation. Will need to confirm information.        OT Problem List: Decreased strength;Impaired balance (sitting and/or standing);Decreased cognition;Decreased safety awareness;Decreased knowledge of use of DME or AE;Pain      OT Treatment/Interventions: Self-care/ADL training;Therapeutic exercise;DME and/or AE instruction;Therapeutic activities;Cognitive remediation/compensation;Patient/family education;Balance training    OT Goals(Current goals can be found in the care plan section) Acute Rehab OT Goals Patient Stated Goal: To return home with son. OT Goal Formulation: With patient Time For Goal Achievement: 12/14/20 Potential to Achieve Goals: Good ADL Goals Additional ADL Goal #1: Patient will complete morning ADLs at [place] guard to supervision A level with LRAD and no more than Mod cues for adherence to cervical precautions. Additional ADL Goal #2: Son will recall 3/3 cervical precautions in prep for patient safe d/c home.  OT Frequency: Min 2X/week   Barriers to D/C:  Co-evaluation              AM-PAC OT "6 Clicks" Daily Activity     Outcome Measure Help from another person eating meals?: A Little Help from another person taking care of personal grooming?: A Little Help from another person toileting, which includes using toliet, bedpan, or urinal?: A Little Help from another person bathing (including washing, rinsing, drying)?: A Little Help from another person to put on and taking off regular upper  body clothing?: A Little   6 Click Score: 15   End of Session Equipment Utilized During Treatment: Gait belt;Rolling walker Nurse Communication: Mobility status  Activity Tolerance: Patient tolerated treatment well Patient left: in bed;with call bell/phone within reach  OT Visit Diagnosis: Unsteadiness on feet (R26.81);Muscle weakness (generalized) (M62.81)                Time: 7972-8206 OT Time Calculation (min): 31 min Charges:  OT General Charges $OT Visit: 1 Visit OT Evaluation $OT Eval Moderate Complexity: 1 Mod OT Treatments $Self Care/Home Management : 8-22 mins  Asher Babilonia H. OTR/L Supplemental OT, Department of rehab services [phone]  Lexine Jaspers R H. 11/30/2020, 9:22 AM

## 2020-11-30 NOTE — Discharge Instructions (Addendum)
Restart Xarelto and Plavix on 12/04/2020  Wound Care Keep incision covered and dry for two days.    Do not put any creams, lotions, or ointments on incision. Leave steri-strips on neck.  They will fall off by themselves. Activity Walk each and every day, increasing distance each day. No lifting greater than 5 lbs.  Avoid excessive neck motion. No driving for 2 weeks; may ride as a passenger locally. Diet Resume your normal diet.   Call Your Doctor If Any of These Occur Redness, drainage, or swelling at the wound.  Temperature greater than 101 degrees. Severe pain not relieved by pain medication. Incision starts to come apart. Follow Up Appt Call today for appointment in 1-2 weeks [phone]) or for problems.

## 2020-11-30 NOTE — TOC Initial Note (Signed)
Transition of Care [facility]) - Initial/Assessment Note    Patient Details  Name: [name] MRN: [id number] Date of Birth: [birth year]  Transition of Care [facility]) CM/SW Contact:    Joanne Chars, LCSW Phone Number: 11/30/2020, 10:51 AM  Clinical Narrative: CSW informed that pt ready for DC, needs HH. CSW met with pt regarding HH recommendation.  Pt agreeable to this, permission given to speak with son Dellis Filbert.  Choice document provided, no preference for pt. CSW attempted to reach son by phone but was unsuccessful.    HH arranged with [facility].  No other needs identified.                 Expected Discharge Plan: [place] Barriers to Discharge: No Barriers Identified   Patient Goals and CMS Choice   CMS Medicare.gov Compare Post Acute Care list provided to:: Patient Choice offered to / list presented to : Patient  Expected Discharge Plan and Services Expected Discharge Plan: [place] Choice:  (floor provided DME) Living arrangements for the past 2 months: Single Family Home Expected Discharge Date: 11/30/20                         [facility] Arranged: PT,OT,Nurse's Aide HH Agency: [facility] Date [place]: 11/30/20 Time [place]: 1050 Representative spoke with at [place]: [place] Arrangements/Services Living arrangements for the past 2 months: [place] with:: Adult Children Patient language and need for interpreter reviewed:: Yes Do you feel safe going back to the place where you live?: Yes      Need for Family Participation in Patient Care: Yes (Comment) Care giver support system in place?: Yes (comment) Current home services: Other (comment) (none) Criminal Activity/Legal Involvement Pertinent to Current Situation/Hospitalization: No - Comment as needed  Activities of Daily Living      Permission Sought/Granted Permission sought to share information  with : Family Supports Permission granted to share information with : Yes, Verbal Permission Granted  Share Information with NAME: son Dellis Filbert  Permission granted to share info w AGENCY: HH        Emotional Assessment Appearance:: Appears stated age Attitude/Demeanor/Rapport: Engaged Affect (typically observed): Pleasant Orientation: : Oriented to Self,Oriented to Place      Admission diagnosis:  Cervical myelopathy ([place]) [G95.9] Patient Active Problem List   Diagnosis Date Noted  . Cervical myelopathy ([place]) 11/29/2020  . Chronic idiopathic constipation 11/12/2020  . Lumbar compression fracture ([place]) 09/29/2020  . Other spondylosis with radiculopathy, cervical region 09/29/2020  . External hemorrhoids 02/15/2018  . History of three vessel coronary artery bypass - 2017 , in [facility] 09/25/2017  . PVD (peripheral vascular disease) ([place])-- stent L leg 06-2017 Dr Feliberto [facility] 07/23/2017  . Phantom pain ([place]) 04/01/2017  . Constipation, chronic 04/01/2017  . S/P unilateral BKA (below knee amputation), right ([place]) 12/14/2016  . Toe osteomyelitis, left ([place]) 11/17/2016  . Acute on chronic diastolic CHF (congestive heart failure), NYHA class 3 ([place]) 03/30/2016  . Coronary artery disease due to lipid rich plaque 03/30/2016  . Acute renal failure superimposed on stage 3 chronic kidney disease ([place]) 03/30/2016  . S/P below knee amputation ([place]) 02/18/2016  . Memory loss 11/26/2015  . PCP NOTES >>>>>>>>>>>>>>>>>>>> 10/18/2015  . S/P partial colectomy 01/08/2015  . 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 Anemia 01/18/2014  . CAD (  coronary artery disease)   . Carcinoma in situ in a polyp   . Depression 09/17/2013  . Charcot's joint 08/21/2013  . Foot osteomyelitis, right ([place]) 07/16/2013  . Chronic pain associated with significant psychosocial dysfunction 05/06/2013  . Neuropathy   . Hyperthyroidism   . Lower extremity edema 03/08/2011  . Annual physical exam  03/08/2011  . ALLERGIC RHINITIS 04/15/2010  . ORTHOSTATIC DIZZINESS 04/13/2010  . HIATAL HERNIA 12/06/2009  . GASTRIC ULCER, HX OF 12/06/2009  . DYSPNEA 03/18/2009  . DEGENERATIVE JOINT DISEASE 06/03/2008  . COLONIC POLYPS 01/27/2008  . DIVERTICULOSIS, COLON 01/27/2008  . IRRITABLE BOWEL SYNDROME, HX OF 01/27/2008  . Pulmonary nodule, right 01/22/2008  . Hyperlipidemia 12/30/2007  . Osteoporosis 12/30/2007  . TB SKIN TEST, POSITIVE 08/09/2007  . DM type 2 with diabetic peripheral neuropathy ([place]) 05/09/2007  . Reflex sympathetic dystrophy-- PAIN MNGMT  05/09/2007  . Essential hypertension 04/15/2007  . GERD 04/15/2007   PCP:  Colon Branch, MD Pharmacy:   [facility], [place] [place] [address] [place] [address] Phone: [phone] Fax: [phone]     Social Determinants of Health (SDOH) Interventions    Readmission Risk Interventions No flowsheet data found.

## 2020-11-30 NOTE — Discharge Summary (Signed)
Physician Discharge Summary     Providing Compassionate, Quality Care - Together   Patient ID: [name] MRN: [id number] DOB/AGE: 01-27-1935 86 y.o.  Admit date: 11/29/2020 Discharge date: 11/30/2020  Admission Diagnoses: Cervical myelopathy  Discharge Diagnoses:  Active Problems:   Cervical myelopathy [facility])   Discharged Condition: good  Hospital Course: Patient was admitted to 3C02 following C3-4 ACDF by Dr. Annette Stable on 11/29/2020. Her postoperative course has been uncomplicated. The patient is tolerating food well. She is having no issues with swallowing or complaints of shortness of breath. Her pain is well-controlled with oral pain medication. She is ready for discharge home with home health physical therapy, occupational therapy, and a home health aide.  Consults: rehabilitation medicine  Significant Diagnostic Studies: radiology: DG Cervical Spine 1 View  Result Date: 11/29/2020 CLINICAL DATA:  Surgery, elective. Additional history provided: Anterior cervical decompression fusion cervical 3-cervical 4. Provided fluoroscopy time 5 seconds (0.30 mGy). EXAM: DG CERVICAL SPINE - 1 VIEW; DG C-ARM 1-60 MIN COMPARISON:  Cervical spine MRI 08/12/2020. FINDINGS: A single lateral view intraoperative fluoroscopic image of the cervical spine is submitted. The image demonstrates new ACDF hardware at the C3-C4 level (ventral plate and screws as well as interbody spacer). Redemonstrated vertebral fusion at the C4-C6 levels. Partially visualized ET tube. IMPRESSION: Single lateral view intraoperative fluoroscopic image of the cervical spine from C3-C4 ACDF, as described. Electronically Signed   By: Kellie Simmering DO   On: 11/29/2020 18:15   DG C-Arm 1-60 Min  Result Date: 11/29/2020 CLINICAL DATA:  Surgery, elective. Additional history provided: Anterior cervical decompression fusion cervical 3-cervical 4. Provided fluoroscopy time 5 seconds (0.30 mGy). EXAM: DG CERVICAL SPINE - 1 VIEW; DG C-ARM 1-60 MIN  COMPARISON:  Cervical spine MRI 08/12/2020. FINDINGS: A single lateral view intraoperative fluoroscopic image of the cervical spine is submitted. The image demonstrates new ACDF hardware at the C3-C4 level (ventral plate and screws as well as interbody spacer). Redemonstrated vertebral fusion at the C4-C6 levels. Partially visualized ET tube. IMPRESSION: Single lateral view intraoperative fluoroscopic image of the cervical spine from C3-C4 ACDF, as described. Electronically Signed   By: Kellie Simmering DO   On: 11/29/2020 18:15     Treatments: surgery:  C3-4 anterior cervical discectomy with interbody fusion utilizing interbody peek cage, locally harvested autograft, and anterior plate instrumentation  Discharge Exam: Blood pressure 124/67, pulse 90, temperature 98.6 F (37 C), temperature source Oral, resp. rate 18, height 5' 4"  (1.626 m), weight 77.1 kg, SpO2 95 %.   Alert and oriented x 4 PERRLA Speech clear, trachea midline CN II-XII grossly intact MAE, Strength and sensation intact Incision is covered with gauze dressing and Steri Strips; Dressing is clean, dry, and intact   Disposition: Discharge disposition: 01-Home or Self Care       Discharge Instructions    Face-to-face encounter (required for Medicare/Medicaid patients)   Complete by: As directed    I Patricia Nettle certify that this patient is under my care and that I, or a nurse practitioner or physician's assistant working with me, had a face-to-face encounter that meets the physician face-to-face encounter requirements with this patient on 11/30/2020. The encounter with the patient was in whole, or in part for the following medical condition(s) which is the primary reason for home health care (List medical condition): C3-4 degenerative spondylolisthesis with severe stenosis and myelopathy   The encounter with the patient was in whole, or in part, for the following medical condition, which is the  primary reason for home health  care: C3-4 degenerative spondylolisthesis with severe stenosis and myelopathy   I certify that, based on my findings, the following services are medically necessary home health services: Nursing   Reason for Medically [place]: Therapy- Therapeutic Exercises to Increase Strength and Endurance   My clinical findings support the need for the above services: Unable to leave home safely without assistance and/or assistive device   Further, I certify that my clinical findings support that this patient is homebound due to: Unable to leave home safely without assistance   Home Health   Complete by: As directed    To provide the following care/treatments:  PT OT Home Health Aide       Allergies as of 11/30/2020      Reactions   Cymbalta [duloxetine Hcl] Swelling   Swelling in legs   Gabapentin Swelling   Swelling in legs Swelling in legs   Silicone Hives, Itching, Dermatitis, Rash   Metformin And Related Other (See Comments)   dizzy, tired, chills, diarrhea, and nausea   Adhesive [tape] Rash   rash rash   Cefazolin Hives   Hives Hives   Ciprofloxacin Other (See Comments)   Other reaction(s): Other (See Comments) Per pt, caused body aches Per pt, caused body aches   Clorazepate Dipotassium Other (See Comments)   Unknown reaction   Dilaudid [hydromorphone Hcl] Other (See Comments)    confused, intense itching   Doxycycline Rash   Enablex [darifenacin Hydrobromide Er] Other (See Comments)   Hypotension, near syncope   Levofloxacin Other (See Comments)   Causes wrist pain   Lyrica [pregabalin] Swelling   Swelling in legs   Methadone Hcl Other (See Comments)   Reaction unknown   Morphine And Related Other (See Comments)   Confusion, constipation.   Talwin [pentazocine] Other (See Comments)   "climbing walls" anxiety      Medication List    STOP taking these medications   augmented betamethasone dipropionate 0.05 % cream Commonly known as: DIPROLENE-AF    hydrocortisone 25 MG suppository Commonly known as: ANUSOL-HC   lidocaine 5 % Commonly known as: Lidoderm   meclizine 25 MG tablet Commonly known as: ANTIVERT   pantoprazole 20 MG tablet Commonly known as: PROTONIX     TAKE these medications   Alpha-Lipoic Acid 600 MG Caps Take 1,200 mg by mouth daily.   amitriptyline 25 MG tablet Commonly known as: ELAVIL Take 1 tablet (25 mg total) by mouth at bedtime.   atorvastatin 40 MG tablet Commonly known as: LIPITOR Take 40 mg by mouth daily.   carvedilol 12.5 MG tablet Commonly known as: COREG Take 12.5 mg by mouth 2 (two) times daily with a meal.   ciclopirox 0.77 % cream Commonly known as: LOPROX Apply 1 application topically 2 (two) times daily as needed (rash).   clopidogrel 75 MG tablet Commonly known as: PLAVIX Take 1 tablet (75 mg total) by mouth at bedtime. Restart on 12/04/2020 What changed: additional instructions   cyclobenzaprine 10 MG tablet Commonly known as: FLEXERIL Take 1 tablet (10 mg total) by mouth 3 (three) times daily as needed for muscle spasms.   diclofenac Sodium 1 % Gel Commonly known as: VOLTAREN Apply 2-4 g topically 4 (four) times daily as needed. What changed: reasons to take this   dicyclomine 10 MG capsule Commonly known as: BENTYL Take 1 capsule (10 mg total) by mouth 3 (three) times daily before meals.   Emgality 120 MG/ML Soaj Generic drug:  Galcanezumab-gnlm Inject 120 mg into the skin every 30 (thirty) days.   famotidine 20 MG tablet Commonly known as: PEPCID Take 1 tablet (20 mg total) by mouth 2 (two) times daily.   ferrous sulfate 325 (65 FE) MG tablet Commonly known as: FeroSul Take 1 tablet (325 mg total) by mouth 2 (two) times daily with a meal.   freestyle lancets CHECK BLOOD SUGAR TWICE DAILY   FREESTYLE LITE test strip Generic drug: glucose blood USE TO CHECK BLOOD SUGAR NO MORE THAN TWICE DAILY   furosemide 20 MG tablet Commonly known as: LASIX Take 20 mg  by mouth daily.   HYDROcodone-acetaminophen 10-325 MG tablet Commonly known as: NORCO Take 1 tablet by mouth every 4 (four) hours as needed for severe pain ((score 7 to 10)). What changed:   when to take this  reasons to take this   ketoconazole 2 % cream Commonly known as: NIZORAL Apply 1 application topically daily.   loperamide 2 MG tablet Commonly known as: IMODIUM A-D Take 2 mg by mouth as needed for diarrhea or loose stools.   lubiprostone 24 MCG capsule Commonly known as: [name] Take 1 capsule (24 mcg total) by mouth 2 (two) times daily with a meal.   MegaRed Omega-3 Krill Oil 500 MG Caps Restart on 12/04/2020 What changed:   medication strength  how much to take  how to take this  when to take this  additional instructions   methenamine 1 g tablet Commonly known as: HIPREX Take 0.5 tablets by mouth daily.   naloxone 4 MG/0.1ML Liqd nasal spray kit Commonly known as: NARCAN Place 1 spray into the nose once as needed for up to 1 dose.   nitroGLYCERIN 0.4 MG SL tablet Commonly known as: NITROSTAT Place 1 tablet (0.4 mg total) under the tongue every 5 (five) minutes x 3 doses as needed for chest pain.   omeprazole 20 MG capsule Commonly known as: PRILOSEC Take 20 mg by mouth 2 (two) times daily before a meal.   ondansetron 4 MG disintegrating tablet Commonly known as: ZOFRAN-ODT Take 1 tablet (4 mg total) by mouth every 8 (eight) hours as needed for nausea or vomiting.   OVER THE COUNTER MEDICATION 1 tablet 3 (three) times daily. Berberorine Gluco Defense   OVER THE COUNTER MEDICATION Take 1 capsule by mouth 2 (two) times daily as needed (digestive issues). Integrative Digestive Formula   pramipexole 0.125 MG tablet Commonly known as: MIRAPEX Take 1-2 tablets (0.125-0.25 mg total) by mouth at bedtime as needed. What changed: reasons to take this   PreserVision AREDS 2 Caps Take 1 capsule by mouth 2 (two) times daily.   REFRESH OP Place 1  drop into both eyes 3 (three) times daily as needed (dry eyes).   rivaroxaban 2.5 MG Tabs tablet Commonly known as: XARELTO Take 1 tablet (2.5 mg total) by mouth 2 (two) times daily. Restart on 12/04/2020 What changed: additional instructions   Vitamin B-12 500 MCG Subl Place 5,000 mcg under the tongue daily.   vitamin C with rose hips 500 MG tablet Take 500 mg by mouth in the morning, at noon, and at bedtime.   Vitamin D3 125 MCG (5000 UT) Caps Take 5,000 Units by mouth every evening. Reported on 11/24/2015   vitamin E 180 MG (400 UNITS) capsule Take 400 Units by mouth daily.       Follow-up Information    Earnie Larsson, MD. Schedule an appointment as soon as possible for a visit in 2 week(s).  Specialty: Neurosurgery Contact information: [address] [place] [address] [phone]               Signed: Patricia Nettle 11/30/2020, 9:09 AM

## 2020-11-30 NOTE — Plan of Care (Signed)
Patient discharged home with son present and discharged instructions given to both. Son stated understanding of instructions given. All belongings with son at the time of dischsarged.

## 2020-12-08 ENCOUNTER — Other Ambulatory Visit: Payer: Self-pay | Admitting: Internal Medicine

## 2020-12-15 ENCOUNTER — Ambulatory Visit (INDEPENDENT_AMBULATORY_CARE_PROVIDER_SITE_OTHER): Payer: Medicare Other | Admitting: *Deleted

## 2020-12-15 ENCOUNTER — Encounter: Payer: Self-pay | Admitting: Orthopedic Surgery

## 2020-12-15 ENCOUNTER — Other Ambulatory Visit: Payer: Self-pay

## 2020-12-15 DIAGNOSIS — M810 Age-related osteoporosis without current pathological fracture: Secondary | ICD-10-CM

## 2020-12-15 MED ORDER — DENOSUMAB 60 MG/ML ~~LOC~~ SOSY
60.0000 mg | PREFILLED_SYRINGE | Freq: Once | SUBCUTANEOUS | Status: AC
  Administered 2020-12-15: 60 mg via SUBCUTANEOUS

## 2020-12-15 NOTE — Progress Notes (Signed)
Patient here for prolia per Dr. Larose Kells.  Injection given in left arm and patient tolerated well.

## 2020-12-17 ENCOUNTER — Ambulatory Visit (INDEPENDENT_AMBULATORY_CARE_PROVIDER_SITE_OTHER): Payer: Medicare Other | Admitting: Internal Medicine

## 2020-12-17 ENCOUNTER — Other Ambulatory Visit: Payer: Self-pay

## 2020-12-17 ENCOUNTER — Encounter: Payer: Self-pay | Admitting: Internal Medicine

## 2020-12-17 VITALS — BP 118/72 | HR 87 | Temp 97.5°F | Resp 18 | Ht 64.0 in

## 2020-12-17 DIAGNOSIS — T2122XA Burn of second degree of abdominal wall, initial encounter: Secondary | ICD-10-CM | POA: Diagnosis not present

## 2020-12-17 DIAGNOSIS — E1142 Type 2 diabetes mellitus with diabetic polyneuropathy: Secondary | ICD-10-CM

## 2020-12-17 MED ORDER — SILVER SULFADIAZINE 1 % EX CREA: 1.0000 "application " | TOPICAL_CREAM | Freq: Every day | CUTANEOUS | 0 refills | Status: DC
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
## 2020-12-17 NOTE — Progress Notes (Signed)
Pre visit review using our clinic review tool, if applicable. No additional management support is needed unless otherwise documented below in the visit note. 

## 2020-12-17 NOTE — Patient Instructions (Addendum)
Keep the area clean with water, then pat it dry and apply  Silvadene  Change the dressings daily  If you notice redness about the burn area, you see a lot of swelling, discharge or you have fever: Call immediately for further advice   GO TO THE LAB : Get the blood work     [place], [place] back for   a checkup in 3 months

## 2020-12-18 LAB — HEMOGLOBIN A1C
Hgb A1c MFr Bld: 6.1 % of total Hgb — ABNORMAL HIGH (ref <5.7)
Mean Plasma Glucose: 128 mg/dL
eAG (mmol/L): 7.1 mmol/L
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
## 2020-12-23 DIAGNOSIS — I10 Essential (primary) hypertension: Secondary | ICD-10-CM | POA: Diagnosis not present

## 2020-12-23 DIAGNOSIS — M4802 Spinal stenosis, cervical region: Secondary | ICD-10-CM | POA: Diagnosis not present

## 2020-12-28 ENCOUNTER — Telehealth: Payer: Self-pay | Admitting: Internal Medicine

## 2020-12-28 MED ORDER — HYDROCODONE-ACETAMINOPHEN 5-325 MG PO TABS: 1.0000 | ORAL_TABLET | Freq: Four times a day (QID) | ORAL | 0 refills | Status: DC | PRN

## 2020-12-28 MED ORDER — HYDROCODONE-ACETAMINOPHEN 10-325 MG PO TABS: 1.0000 | ORAL_TABLET | Freq: Four times a day (QID) | ORAL | 0 refills | Status: DC | PRN

## 2020-12-28 NOTE — Telephone Encounter (Signed)
Medication: HYDROcodone-acetaminophen (NORCO) 10-325 MG tablet [[id number]]      Has the patient contacted their pharmacy? NO (If no, request that the patient contact the pharmacy for the refill.) (If yes, when and what did the pharmacy advise?)    Preferred Pharmacy (with phone number or street name):   [facility] [place], [address] AT [address] [place] Phone:  [phone]  Fax:  [phone]         Agent: Please be advised that RX refills may take up to 3 business days. We ask that you follow-up with your pharmacy.

## 2020-12-28 NOTE — Telephone Encounter (Signed)
Patient's son called, she is still in severe pain and request chang to hydrocodone 10 mg, I sent a new prescription and we are canceling the hydrocodone 5 mg.

## 2020-12-28 NOTE — Addendum Note (Signed)
Addended by: [name] E on: 12/28/2020 05:05 PM   Modules accepted: Orders

## 2020-12-28 NOTE — Telephone Encounter (Signed)
PDMP okay, prescribed hydrocodone 5-325 mg, 4 times daily as needed.

## 2020-12-28 NOTE — Telephone Encounter (Signed)
Pt's son called- Pt had recent neck surgery and still in a lot of pain- requesting 10-325mg  dose. Larose Kells will do for this month and will monitor on monthly basis. Spoke w/ [facility]- informed to d/c 5-325mg  dose.

## 2020-12-28 NOTE — Telephone Encounter (Signed)
Requesting: hydrocodone 10-325mg  Contract: 09/22/2020 UDS: 06/11/2020 Last Visit: 12/17/2020 Next Visit: 03/16/21  Last Refill: 11/30/2020 #30 and 0RF by Viona Gilmore  Please Advise

## 2020-12-29 ENCOUNTER — Telehealth: Payer: Self-pay | Admitting: Gastroenterology

## 2020-12-29 NOTE — Telephone Encounter (Signed)
Inbound call from patient requesting a call back from a nurse please.  Did not specify further. 

## 2020-12-29 NOTE — Telephone Encounter (Signed)
Patient reports that she is having left sided abdominal and some rectal bleeding. She will come in and see Alonza Bogus, PA tomorrow at 2;00

## 2020-12-30 ENCOUNTER — Ambulatory Visit: Payer: Medicare Other | Admitting: Gastroenterology

## 2021-01-10 ENCOUNTER — Telehealth: Payer: Self-pay | Admitting: *Deleted

## 2021-01-10 MED ORDER — OMEPRAZOLE 20 MG PO CPDR: 20.0000 mg | DELAYED_RELEASE_CAPSULE | Freq: Two times a day (BID) | ORAL | 3 refills | Status: DC

## 2021-01-10 NOTE — Telephone Encounter (Signed)
Refill fax request from pharmacy sent omeprazole 90 day electronically

## 2021-01-14 ENCOUNTER — Other Ambulatory Visit: Payer: Self-pay

## 2021-01-14 MED ORDER — DICYCLOMINE HCL 10 MG PO CAPS: 10.0000 mg | ORAL_CAPSULE | Freq: Three times a day (TID) | ORAL | 0 refills | Status: DC

## 2021-01-17 ENCOUNTER — Telehealth: Payer: Self-pay | Admitting: Gastroenterology

## 2021-01-17 ENCOUNTER — Other Ambulatory Visit: Payer: Self-pay | Admitting: Internal Medicine

## 2021-01-17 DIAGNOSIS — G43009 Migraine without aura, not intractable, without status migrainosus: Secondary | ICD-10-CM

## 2021-01-17 NOTE — Telephone Encounter (Signed)
Daleen Snook from [facility] called requesting a signature from provider for request they received on the 01-12-21 for [name]  Tel: [phone] Ref [id number]

## 2021-01-17 NOTE — Telephone Encounter (Signed)
Faxed script back once again with Dr Jillyn Hidden original signature, No Stamp

## 2021-01-20 DIAGNOSIS — M4802 Spinal stenosis, cervical region: Secondary | ICD-10-CM | POA: Diagnosis not present

## 2021-01-26 ENCOUNTER — Other Ambulatory Visit: Payer: Self-pay

## 2021-01-26 ENCOUNTER — Encounter: Payer: Self-pay | Admitting: Internal Medicine

## 2021-01-26 ENCOUNTER — Ambulatory Visit (INDEPENDENT_AMBULATORY_CARE_PROVIDER_SITE_OTHER): Payer: Medicare Other | Admitting: Internal Medicine

## 2021-01-26 VITALS — BP 136/68 | HR 89 | Temp 98.2°F | Resp 18 | Ht 64.0 in

## 2021-01-26 DIAGNOSIS — R059 Cough, unspecified: Secondary | ICD-10-CM | POA: Diagnosis not present

## 2021-01-26 DIAGNOSIS — M792 Neuralgia and neuritis, unspecified: Secondary | ICD-10-CM

## 2021-01-26 DIAGNOSIS — L89312 Pressure ulcer of right buttock, stage 2: Secondary | ICD-10-CM

## 2021-01-26 DIAGNOSIS — R52 Pain, unspecified: Secondary | ICD-10-CM

## 2021-01-26 MED ORDER — SILVER SULFADIAZINE 1 % EX CREA: 1.0000 "application " | TOPICAL_CREAM | Freq: Every day | CUTANEOUS | 1 refills | Status: DC

## 2021-01-26 NOTE — Patient Instructions (Addendum)
Frequent turning  Sheep skin  Silvadene  Non adherent gauze  Watch for signs of infection or worsening: Call the office  For cough: Robitussin-DM as needed   Pressure Injury  A pressure injury is damage to the skin and underlying tissue that results from pressure being applied to an area of the body. It often affects people who must spend a long time in a bed or chair because of a medical condition. Pressure injuries usually occur:  Over bony parts of the body, such as the tailbone, shoulders, elbows, hips, heels, spine, ankles, and back of the head.  Under medical devices that make contact with the body, such as respiratory equipment, stockings, tubes, and splints. Pressure injuries start as reddened areas on the skin and can lead to pain and an open wound. What are the causes? This condition is caused by frequent or constant pressure to an area of the body. Decreased blood flow to the skin can eventually cause the skin tissue to die and break down, causing a wound. What increases the risk? You are more likely to develop this condition if you:  Are in the hospital or an extended care facility.  Are bedridden or in a wheelchair.  Have an injury or disease that keeps you from: ? Moving normally. ? Feeling pain or pressure.  Have a condition that: ? Makes you sleepy or less alert. ? Causes poor blood flow.  Need to wear a medical device.  Have poor control of your bladder or bowel functions (incontinence).  Have poor nutrition (malnutrition). If you are at risk for pressure injuries, your health care provider may recommend certain types of mattresses, mattress covers, pillows, cushions, or boots to help prevent them. These may include products filled with air, foam, gel, or sand. What are the signs or symptoms? Symptoms of this condition depend on the severity of the injury. Symptoms may include:  Red or dark areas of the skin.  Pain, warmth, or a change of skin  texture.  Blisters.  An open wound. How is this diagnosed? This condition is diagnosed with a medical history and physical exam. You may also have tests, such as:  Blood tests.  Imaging tests.  Blood flow tests. Your pressure injury will be staged based on its severity. Staging is based on:  The depth of the tissue injury, including whether there is exposure of muscle, bone, or tendon.  The cause of the pressure injury. How is this treated? This condition may be treated by:  Relieving or redistributing pressure on your skin. This includes: ? Frequently changing your position. ? Avoiding positions that caused the wound or that can make the wound worse. ? Using specific bed mattresses, chair cushions, or protective boots. ? Moving medical devices from an area of pressure, or placing padding between the skin and the device. ? Using foams, creams, or powders to prevent rubbing (friction) on the skin.  Keeping your skin clean and dry. This may include using a skin cleanser or skin barrier as told by your health care provider.  Cleaning your injury and removing any dead tissue from the wound (debridement).  Placing a bandage (dressing) over your injury.  Using medicines for pain or to prevent or treat infection. Surgery may be needed if other treatments are not working or if your injury is very deep. Follow these instructions at home: Wound care  Follow instructions from your health care provider about how to take care of your wound. Make sure you: ? Wash  your hands with soap and water before and after you change your bandage (dressing). If soap and water are not available, use hand sanitizer. ? Change your dressing as told by your health care provider.  Check your wound every day for signs of infection. Have a caregiver do this for you if you are not able. Check for: ? Redness, swelling, or increased pain. ? More fluid or blood. ? Warmth. ? Pus or a bad smell. Skin  care  Keep your skin clean and dry. Gently pat your skin dry.  Do not rub or massage your skin.  You or a caregiver should check your skin every day for any changes in color or any new blisters or sores (ulcers). Medicines  Take over-the-counter and prescription medicines only as told by your health care provider.  If you were prescribed an antibiotic medicine, take or apply it as told by your health care provider. Do not stop using the antibiotic even if your condition improves. Reducing and redistributing pressure  Do not lie or sit in one position for a long time. Move or change position every 1-2 hours, or as told by your health care provider.  Use pillows or cushions to reduce pressure. Ask your health care provider to recommend cushions or pads for you. General instructions  Eat a healthy diet that includes lots of protein.  Drink enough fluid to keep your urine pale yellow.  Be as active as you can every day. Ask your health care provider to suggest safe exercises or activities.  Do not abuse drugs or alcohol.  Do not use any products that contain nicotine or tobacco, such as cigarettes, e-cigarettes, and chewing tobacco. If you need help quitting, ask your health care provider.  Keep all follow-up visits as told by your health care provider. This is important.   Contact a health care provider if:  You have: ? A fever or chills. ? Pain that is not helped by medicine. ? Any changes in skin color. ? New blisters or sores. ? Pus or a bad smell coming from your wound. ? Redness, swelling, or pain around your wound. ? More fluid or blood coming from your wound.  Your wound does not improve after 1-2 weeks of treatment. Summary  A pressure injury is damage to the skin and underlying tissue that results from pressure being applied to an area of the body.  Do not lie or sit in one position for a long time. Your health care provider may advise you to move or change  position every 1-2 hours.  Follow instructions from your health care provider about how to take care of your wound.  Keep all follow-up visits as told by your health care provider. This is important. This information is not intended to replace advice given to you by your health care provider. Make sure you discuss any questions you have with your health care provider. Document Revised: 05/08/2018 Document Reviewed: 05/08/2018 Elsevier Patient Education  [place].

## 2021-01-26 NOTE — Progress Notes (Signed)
Subjective:    Patient ID: [name], female    DOB: 1935/10/10, 86 y.o.   MRN: [id number]  DOS:  01/26/2021 Type of visit - description: Acute, here with her son  Patient noted a pressure ulcer at the right buttock approximately 2 weeks ago. Reports pain, occasional bleeding, question of discharge.  Also, several weeks history of cough, on and off, she produces sometimes thick yellowish sputum, no hemoptysis.  No wheezing. No fever chills Breathing is otherwise at baseline.  Review of Systems See above   Past Medical History:  Diagnosis Date  . Allergic rhinitis   . Arthritis    "back; ankles; hands; knees" (10/31/2013)  . Bilateral sensorineural hearing loss   . CAD (coronary artery disease)    a. Nonobst in 2011. b. Abnormal nuc 09/2013 -> s/p cutting balloon to D2, mild LAD disease; c. 08/2015 MV: no ischemia, EF 71%.  . Carcinoma in situ in a polyp 1994   a. 1994 - malignant polyp removed during colonoscopy.  . Chronic diastolic CHF (congestive heart failure) ([place])    a. 09/2013 Echo: EF 60-65%, mild LVH, Gr1 DD.  Marland Kitchen Dementia ([place])   . Diverticulosis   . DJD (degenerative joint disease)   . Fatty liver   . GERD (gastroesophageal reflux disease)    a. Hx GERD/esophageal dysmotility followed by Dr. Olevia Perches.   Marland Kitchen Hemorrhoids   . Hiatal hernia    a. s/p Nissen fundoplication 3570.  Marland Kitchen Hyperlipidemia    a. patient unwilling to use statins.  . Hypertensive heart disease   . IBS (irritable bowel syndrome)   . Macular degeneration 03/2009   Dr. Rosana Hoes  . Neuropathy    a. Hands, feet, legs.  . Orthostatic hypotension   . Osteomyelitis ([place])    a. 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 01/2016 s/p RLE transtibial amputation 2/2 Charcot rocker-bottom deformity and insensate neuropathy ulceration.  . Osteoporosis   . PE (pulmonary embolism) 12/2007   a. PE/DVT after neck surgery 2009. b. coumadin d/c 10-2008.  Marland Kitchen PONV  (postoperative nausea and vomiting) 2009   neck surgery  . PPD positive   . Recurrent UTI   . RSD (reflex sympathetic dystrophy)    a. Chronic pain.  Marland Kitchen Spinal stenosis   . Type II diabetes mellitus (HCC)    no on medication  . Venous insufficiency    a. Contributing to LEE.    Past Surgical History:  Procedure Laterality Date  . AMPUTATION  03/06/2012   Procedure: AMPUTATION FOOT;  Surgeon: Newt Minion, MD;  Location: [place];  Service: Orthopedics;  Laterality: Left;  FIFTH RAY AMPUTATION   . AMPUTATION Right 02/18/2016   Procedure: AMPUTATION BELOW KNEE;  Surgeon: Newt Minion, MD;  Location: [place];  Service: Orthopedics;  Laterality: Right;  . AMPUTATION Left 11/22/2016   Procedure: Amputation 4th Toe Left Foot at Metatarsophalangeal Joint;  Surgeon: Newt Minion, MD;  Location: [place];  Service: Orthopedics;  Laterality: Left;  . ANKLE FUSION  09/27/2012   Procedure: ANKLE FUSION;  Surgeon: Newt Minion, MD;  Location: [place];  Service: Orthopedics;  Laterality: Left;  Left Tibiocalcaneal Fusion  . ANKLE FUSION Right 05/09/2013   Procedure: ANKLE FUSION;  Surgeon: Newt Minion, MD;  Location: [place];  Service: Orthopedics;  Laterality: Right;  Excision Osteomyelitis Base 1st MT Right Foot, Fusion Medial Column  . ANTERIOR CERVICAL DECOMP/DISCECTOMY FUSION  12/18/07  For OA,  Dr. Lorin Mercy:  fu by a PE  . ANTERIOR CERVICAL DECOMP/DISCECTOMY FUSION N/A 11/29/2020   Procedure: Anterior Cervical Decompression Fusion - Cervical Three-Cerivcal Four;  Surgeon: Earnie Larsson, MD;  Location: [place];  Service: Neurosurgery;  Laterality: N/A;  3C  . CARDIAC CATHETERIZATION  05/2010    at [facility]  . CARPAL TUNNEL RELEASE Left 09/2011  . CATARACT EXTRACTION W/ INTRAOCULAR LENS  IMPLANT, BILATERAL  2004   feb 2004 left, aug 2004 right  . CHOLECYSTECTOMY  03/2002  . CORONARY ANGIOPLASTY  10/31/2013  . CORONARY ARTERY BYPASS GRAFT  09/27/2016  . CYST REMOVAL HAND  06/2003  . FOOT BONE EXCISION Right  06/2009  . LAPAROSCOPIC RIGHT HEMI COLECTOMY N/A 01/08/2015   Procedure: LAPAROSCOPIC ASSISTED RIGHT HEMI COLECTOMY;  Surgeon: Johnathan Hausen, MD;  Location: [facility];  Service: General;  Laterality: N/A;  . LEFT HEART CATHETERIZATION WITH CORONARY ANGIOGRAM N/A 10/31/2013   Procedure: LEFT HEART CATHETERIZATION WITH CORONARY ANGIOGRAM;  Surgeon: Jettie Booze, MD;  Location: [facility] CATH LAB;  Service: Cardiovascular;  Laterality: N/A;  . NASAL SEPTUM SURGERY  09/1963  . NISSEN FUNDOPLICATION  05/28/7618  . PERCUTANEOUS CORONARY INTERVENTION-BALLOON ONLY  10/31/2013   Procedure: PERCUTANEOUS CORONARY INTERVENTION-BALLOON ONLY;  Surgeon: Jettie Booze, MD;  Location: [facility] CATH LAB;  Service: Cardiovascular;;  . ROTATOR CUFF REPAIR Right 07/2007   Dr. Percell Miller  . ROTATOR CUFF REPAIR Left 11/2004  . stent in left leg, behind knee  06/27/2017   x2, done at [facility] cardiology in [facility]  . TOE AMPUTATION Right 08/2008   3rd toe, Dr. Sharol Given due to osteomyelitis  . VAGINAL HYSTERECTOMY  03/1975  . VEIN LIGATION Bilateral 03/1966  . VENA CAVA FILTER PLACEMENT  01/2010   green filter; "due to blood clots"  . WISDOM TOOTH EXTRACTION  06/2007    Allergies as of 01/26/2021      Reactions   Cymbalta [duloxetine Hcl] Swelling   Swelling in legs   Gabapentin Swelling   Swelling in legs Swelling in legs   Silicone Hives, Itching, Dermatitis, Rash   Metformin And Related Other (See Comments)   dizzy, tired, chills, diarrhea, and nausea   Adhesive [tape] Rash   rash rash   Cefazolin Hives   Hives Hives   Ciprofloxacin Other (See Comments)   Other reaction(s): Other (See Comments) Per pt, caused body aches Per pt, caused body aches   Clorazepate Dipotassium Other (See Comments)   Unknown reaction   Dilaudid [hydromorphone Hcl] Other (See Comments)    confused, intense itching   Doxycycline Rash   Enablex [darifenacin Hydrobromide Er] Other (See Comments)   Hypotension, near syncope    Levofloxacin Other (See Comments)   Causes wrist pain   Lyrica [pregabalin] Swelling   Swelling in legs   Methadone Hcl Other (See Comments)   Reaction unknown   Morphine And Related Other (See Comments)   Confusion, constipation.   Talwin [pentazocine] Other (See Comments)   "climbing walls" anxiety      Medication List       Accurate as of January 26, 2021 11:59 PM. If you have any questions, ask your nurse or doctor.        STOP taking these medications   ketoconazole 2 % cream Commonly known as: NIZORAL Stopped by: [name], MD     TAKE these medications   Alpha-Lipoic Acid 600 MG Caps Take 1,200 mg by mouth daily.   amitriptyline 25 MG tablet Commonly  known as: ELAVIL Take 1 tablet (25 mg total) by mouth at bedtime.   atorvastatin 40 MG tablet Commonly known as: LIPITOR Take 40 mg by mouth daily.   carvedilol 12.5 MG tablet Commonly known as: COREG Take 12.5 mg by mouth 2 (two) times daily with a meal.   clopidogrel 75 MG tablet Commonly known as: PLAVIX Take 1 tablet (75 mg total) by mouth at bedtime. Restart on 12/04/2020   cyclobenzaprine 10 MG tablet Commonly known as: FLEXERIL Take 1 tablet (10 mg total) by mouth 3 (three) times daily as needed for muscle spasms.   diclofenac Sodium 1 % Gel Commonly known as: VOLTAREN Apply 2-4 g topically 4 (four) times daily as needed. What changed: reasons to take this   dicyclomine 10 MG capsule Commonly known as: BENTYL Take 1 capsule (10 mg total) by mouth 3 (three) times daily before meals. NEEDS APPT   Emgality 120 MG/ML Soaj Generic drug: Galcanezumab-gnlm Inject 120 mg into the skin every 30 (thirty) days.   famotidine 20 MG tablet Commonly known as: PEPCID Take 1 tablet (20 mg total) by mouth 2 (two) times daily.   ferrous sulfate 325 (65 FE) MG tablet Commonly known as: FeroSul Take 1 tablet (325 mg total) by mouth 2 (two) times daily with a meal.   freestyle lancets CHECK BLOOD SUGAR TWICE  DAILY   FREESTYLE LITE test strip Generic drug: glucose blood USE TO CHECK BLOOD SUGAR NO MORE THAN TWICE DAILY   furosemide 20 MG tablet Commonly known as: LASIX Take 20 mg by mouth daily.   HYDROcodone-acetaminophen 10-325 MG tablet Commonly known as: NORCO Take 1 tablet by mouth every 6 (six) hours as needed.   loperamide 2 MG tablet Commonly known as: IMODIUM A-D Take 2 mg by mouth as needed for diarrhea or loose stools.   lubiprostone 24 MCG capsule Commonly known as: [name] Take 1 capsule (24 mcg total) by mouth 2 (two) times daily with a meal.   MegaRed Omega-3 Krill Oil 500 MG Caps Restart on 12/04/2020   methenamine 1 g tablet Commonly known as: HIPREX Take 0.5 tablets by mouth daily.   naloxone 4 MG/0.1ML Liqd nasal spray kit Commonly known as: NARCAN Place 1 spray into the nose once as needed for up to 1 dose.   nitroGLYCERIN 0.4 MG SL tablet Commonly known as: NITROSTAT Place 1 tablet (0.4 mg total) under the tongue every 5 (five) minutes x 3 doses as needed for chest pain.   omeprazole 20 MG capsule Commonly known as: PRILOSEC Take 1 capsule (20 mg total) by mouth 2 (two) times daily before a meal.   ondansetron 4 MG disintegrating tablet Commonly known as: ZOFRAN-ODT Take 1 tablet (4 mg total) by mouth every 8 (eight) hours as needed for nausea or vomiting.   OVER THE COUNTER MEDICATION 1 tablet 3 (three) times daily. [place] Gluco Defense   OVER THE COUNTER MEDICATION Take 1 capsule by mouth 2 (two) times daily as needed (digestive issues). Integrative Digestive Formula   pramipexole 0.125 MG tablet Commonly known as: MIRAPEX Take 1-2 tablets (0.125-0.25 mg total) by mouth at bedtime as needed. What changed: reasons to take this   PreserVision AREDS 2 Caps Take 1 capsule by mouth 2 (two) times daily.   REFRESH OP Place 1 drop into both eyes 3 (three) times daily as needed (dry eyes).   rivaroxaban 2.5 MG Tabs tablet Commonly known as:  XARELTO Take 1 tablet (2.5 mg total) by mouth 2 (two) times  daily. Restart on 12/04/2020   silver sulfADIAZINE 1 % cream Commonly known as: SILVADENE Apply 1 application topically daily.   Vitamin B-12 500 MCG Subl Place 5,000 mcg under the tongue daily.   vitamin C with rose hips 500 MG tablet Take 500 mg by mouth in the morning, at noon, and at bedtime.   Vitamin D3 125 MCG (5000 UT) Caps Take 5,000 Units by mouth every evening. Reported on 11/24/2015   vitamin E 180 MG (400 UNITS) capsule Take 400 Units by mouth daily.          Objective:   Physical Exam BP 136/68 (BP Location: Left Arm, Patient Position: Sitting, Cuff Size: Normal)   Pulse 89   Temp 98.2 F (36.8 C) (Oral)   Resp 18   Ht $R'5\' 4"'Uk$  (1.626 m)   SpO2 98%   BMI 29.18 kg/m  General:   Well developed, NAD, BMI noted. HEENT:  Normocephalic . Face symmetric, atraumatic Lungs:  CTA B Normal respiratory effort, no intercostal retractions, no accessory muscle use. Heart: RRR,  no murmur.  Skin: Has a pressure ulcer on the left buttock, see picture. Exam assisted by my CMA Vilma Prader) Neurologic:  alert & oriented X3.  Speech normal, gait not tested Psych--  Cognition and judgment appear intact.  Cooperative with normal attention span and concentration.  Behavior appropriate. No anxious or depressed appearing.        Assessment      Assessment DM, Neuropathy, amputations due to Charcot feet. - metformin intolerant  (lethargic) HTN  Hyperlipidemia LUNG MASS -incidental per CT 2009,  PET scan 01-2008: likely  benign, CT 08-2009 no change, no further CTs per Dr Gwenette Greet - Had a CT in [place] 09-2016 "spiculated mass", saw Dr Lamonte Sakai,  CT  09/2017 stable CV: ---CAD sees Dr Meda Coffee and a cardiologist in [facility] --NSTEMI 09-2016. Outside hospital: Cath 2 vessel disease, CABG 2, LIMA to LAD and Diag   ---Chronic diastolic CHF ---PVD: Aortogram and a stent L Leg Dr Feliberto Gottron, 06-27-2017 @ [place] (per pt) GI: GERD,  IBS, colon polyps, PUD, abdominal pain "post polypectomy syndrome" naueas meds prn (antivert-zofran, gets nausea when car traveling) Osteoporosis  MSK,pain mngmt : --Reflex sympathetic dystrophy --Neuropathy --DJD --Spinal stenosis  --H/o Osteomyelitis  Foot --s/p amputation:  4th 5th L toes , R BKA  (01-2016, dx osteomyelitis) w/ phantom pain  -- sees Dr Sharol Given prn She had local injections with Dr. Maia Petties 2016 w/o apparent help. She took oxycodone after surgery and that did NOT seem to help better than hydrocodone. Intolerant to Cymbalta, Neurontin ; Lyrica helped but caused edema. Recurrent UTIs: Dr Junious Silk  Venous insufficiency  H/o  + PPD H/o hypothyroidism: TSHs stable H/o dyspnea   PLAN:  DM: Diet controlled, last A1c 6.1. Pressure ulcer stage II: Recommend frequent turning, sheepskin, Silvadene, no adhesive covers, watch for worsening. Cough: As described above, recommend Robitussin-DM.  Call if not better. Pain management: At the end of the visit, the patient raised the issue again. Back on 11/2020 had neck surgery with C3-4 discectomy and fusion. Went to see her surgeon, things are going as expected from the surgical stand point, patient however still has pain. After long conversation we agreed on referral to pain management. Will arrange.  Continue hydrocodone 10 mg every 6 hours as needed.  She is not sure muscle relaxants are helping much.  Time spent 32 minutes This visit occurred during the SARS-CoV-2 public health emergency.  Safety protocols were in place, including  screening questions prior to the visit, additional usage of staff PPE, and extensive cleaning of exam room while observing appropriate contact time as indicated for disinfecting solutions.

## 2021-01-27 NOTE — Assessment & Plan Note (Signed)
DM: Diet controlled, last A1c 6.1. Pressure ulcer stage II: Recommend frequent turning, sheepskin, Silvadene, no adhesive covers, watch for worsening. Cough: As described above, recommend Robitussin-DM.  Call if not better. Pain management: At the end of the visit, the patient raised the issue again. Back on 11/2020 had neck surgery with C3-4 discectomy and fusion. Went to see her surgeon, things are going as expected from the surgical stand point, patient however still has pain. After long conversation we agreed on referral to pain management. Will arrange.  Continue hydrocodone 10 mg every 6 hours as needed.  She is not sure muscle relaxants are helping much.

## 2021-02-11 DIAGNOSIS — D3613 Benign neoplasm of peripheral nerves and autonomic nervous system of lower limb, including hip: Secondary | ICD-10-CM | POA: Diagnosis not present

## 2021-02-11 DIAGNOSIS — M47812 Spondylosis without myelopathy or radiculopathy, cervical region: Secondary | ICD-10-CM | POA: Diagnosis not present

## 2021-02-11 DIAGNOSIS — G546 Phantom limb syndrome with pain: Secondary | ICD-10-CM | POA: Diagnosis not present

## 2021-02-23 DIAGNOSIS — S32040A Wedge compression fracture of fourth lumbar vertebra, initial encounter for closed fracture: Secondary | ICD-10-CM | POA: Diagnosis not present

## 2021-02-23 DIAGNOSIS — M4802 Spinal stenosis, cervical region: Secondary | ICD-10-CM | POA: Diagnosis not present

## 2021-02-23 NOTE — Progress Notes (Signed)
Subjective:   [name] is a 86 y.o. female who presents for Medicare Annual (Subsequent) preventive examination.  Review of Systems     Cardiac Risk Factors include: advanced age (>20men, >44 women);diabetes mellitus;dyslipidemia;sedentary lifestyle     Objective:    Today's Vitals   02/24/21 1518 02/24/21 1525  BP: 122/70   Pulse: 97   Resp: 16   Temp: 97.8 F (36.6 C)   SpO2: 91%   Height: 5\' 4"  (1.626 m)   PainSc:  8    Body mass index is 29.18 kg/m.  Advanced Directives 02/24/2021 11/25/2020 07/18/2020 01/30/2020 12/24/2019 12/14/2016 11/22/2016  Does Patient Have a Medical Advance Directive? No No No No No No (No Data)  Does patient want to make changes to medical advance directive? - - - - - Yes (MAU/Ambulatory/Procedural Areas - Information given) -  Would patient like information on creating a medical advance directive? Yes (MAU/Ambulatory/Procedural Areas - Information given) - No - Patient declined No - Patient declined No - Patient declined - -  Pre-existing out of facility DNR order (yellow form or pink MOST form) - - - - - - -    Current Medications (verified) Outpatient Encounter Medications as of 02/24/2021  Medication Sig  . Alpha-Lipoic Acid 600 MG CAPS Take 1,200 mg by mouth daily.  Marland Kitchen amitriptyline (ELAVIL) 25 MG tablet Take 1 tablet (25 mg total) by mouth at bedtime.  . Ascorbic Acid (VITAMIN C WITH ROSE HIPS) 500 MG tablet Take 500 mg by mouth in the morning, at noon, and at bedtime.  Marland Kitchen atorvastatin (LIPITOR) 40 MG tablet Take 40 mg by mouth daily.  . carvedilol (COREG) 12.5 MG tablet Take 12.5 mg by mouth 2 (two) times daily with a meal.  . Cholecalciferol (VITAMIN D3) 5000 UNITS CAPS Take 5,000 Units by mouth every evening. Reported on 11/24/2015  . clopidogrel (PLAVIX) 75 MG tablet Take 1 tablet (75 mg total) by mouth at bedtime. Restart on 12/04/2020  . Cyanocobalamin (VITAMIN B-12) 500 MCG SUBL Place 5,000 mcg under the tongue daily.  . diclofenac Sodium  (VOLTAREN) 1 % GEL Apply 2-4 g topically 4 (four) times daily as needed. (Patient taking differently: Apply 2-4 g topically 4 (four) times daily as needed (joint pain).)  . dicyclomine (BENTYL) 10 MG capsule Take 1 capsule (10 mg total) by mouth 3 (three) times daily before meals. NEEDS APPT  . famotidine (PEPCID) 20 MG tablet Take 1 tablet (20 mg total) by mouth 2 (two) times daily.  . ferrous sulfate (FEROSUL) 325 (65 FE) MG tablet Take 1 tablet (325 mg total) by mouth 2 (two) times daily with a meal.  . furosemide (LASIX) 20 MG tablet Take 20 mg by mouth daily.  . Galcanezumab-gnlm (EMGALITY) 120 MG/ML SOAJ Inject 120 mg into the skin every 30 (thirty) days.  Marland Kitchen glucose blood (FREESTYLE LITE) test strip USE TO CHECK BLOOD SUGAR NO MORE THAN TWICE DAILY  . HYDROcodone-acetaminophen (NORCO) 10-325 MG tablet Take 1 tablet by mouth every 6 (six) hours as needed.  . Lancets (FREESTYLE) lancets CHECK BLOOD SUGAR TWICE DAILY  . loperamide (IMODIUM A-D) 2 MG tablet Take 2 mg by mouth as needed for diarrhea or loose stools.  . lubiprostone ([name]) 24 MCG capsule Take 1 capsule (24 mcg total) by mouth 2 (two) times daily with a meal.  . MegaRed Omega-3 Krill Oil 500 MG CAPS Restart on 12/04/2020  . methenamine (HIPREX) 1 g tablet Take 0.5 tablets by mouth daily.  Marland Kitchen  Multiple Vitamins-Minerals (PRESERVISION AREDS 2) CAPS Take 1 capsule by mouth 2 (two) times daily.  Marland Kitchen omeprazole (PRILOSEC) 20 MG capsule Take 1 capsule (20 mg total) by mouth 2 (two) times daily before a meal.  . ondansetron (ZOFRAN-ODT) 4 MG disintegrating tablet Take 1 tablet (4 mg total) by mouth every 8 (eight) hours as needed for nausea or vomiting.  Marland Kitchen OVER THE COUNTER MEDICATION Take 1 capsule by mouth 2 (two) times daily as needed (digestive issues). Integrative Digestive Formula  . OVER THE COUNTER MEDICATION 1 tablet 3 (three) times daily. Berberorine Gluco Defense  . Polyvinyl Alcohol-Povidone (REFRESH OP) Place 1 drop into both  eyes 3 (three) times daily as needed (dry eyes).   . pramipexole (MIRAPEX) 0.125 MG tablet Take 1-2 tablets (0.125-0.25 mg total) by mouth at bedtime as needed. (Patient taking differently: Take 0.125-0.25 mg by mouth at bedtime as needed (restless leg).)  . pregabalin (LYRICA) 75 MG capsule Take 75 mg by mouth 2 (two) times daily.  . rivaroxaban (XARELTO) 2.5 MG TABS tablet Take 1 tablet (2.5 mg total) by mouth 2 (two) times daily. Restart on 12/04/2020  . silver sulfADIAZINE (SILVADENE) 1 % cream Apply 1 application topically daily.  . vitamin E 180 MG (400 UNITS) capsule Take 400 Units by mouth daily.  . cyclobenzaprine (FLEXERIL) 10 MG tablet Take 1 tablet (10 mg total) by mouth 3 (three) times daily as needed for muscle spasms. (Patient not taking: No sig reported)  . naloxone (NARCAN) nasal spray 4 mg/0.1 mL Place 1 spray into the nose once as needed for up to 1 dose. (Patient not taking: No sig reported)  . nitroGLYCERIN (NITROSTAT) 0.4 MG SL tablet Place 1 tablet (0.4 mg total) under the tongue every 5 (five) minutes x 3 doses as needed for chest pain. (Patient not taking: No sig reported)   No facility-administered encounter medications on file as of 02/24/2021.    Allergies (verified) Cymbalta [duloxetine hcl], Gabapentin, Silicone, Metformin and related, Adhesive [tape], Cefazolin, Ciprofloxacin, Clorazepate dipotassium, Dilaudid [hydromorphone hcl], Doxycycline, Enablex [darifenacin hydrobromide er], Levofloxacin, Lyrica [pregabalin], Methadone hcl, Morphine and related, and Talwin [pentazocine]   History: Past Medical History:  Diagnosis Date  . Allergic rhinitis   . Arthritis    "back; ankles; hands; knees" (10/31/2013)  . Bilateral sensorineural hearing loss   . CAD (coronary artery disease)    a. Nonobst in 2011. b. Abnormal nuc 09/2013 -> s/p cutting balloon to D2, mild LAD disease; c. 08/2015 MV: no ischemia, EF 71%.  . Carcinoma in situ in a polyp 1994   a. 1994 - malignant  polyp removed during colonoscopy.  . Chronic diastolic CHF (congestive heart failure) ([place])    a. 09/2013 Echo: EF 60-65%, mild LVH, Gr1 DD.  Marland Kitchen Dementia ([place])   . Diverticulosis   . DJD (degenerative joint disease)   . Fatty liver   . GERD (gastroesophageal reflux disease)    a. Hx GERD/esophageal dysmotility followed by Dr. Olevia Perches.   Marland Kitchen Hemorrhoids   . Hiatal hernia    a. s/p Nissen fundoplication [id number].  Marland Kitchen Hyperlipidemia    a. patient unwilling to use statins.  . Hypertensive heart disease   . IBS (irritable bowel syndrome)   . Macular degeneration 03/2009   Dr. Rosana Hoes  . Neuropathy    a. Hands, feet, legs.  . Orthostatic hypotension   . Osteomyelitis ([place])    a. 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 01/2016 s/p  RLE transtibial amputation 2/2 Charcot rocker-bottom deformity and insensate neuropathy ulceration.  . Osteoporosis   . PE (pulmonary embolism) 12/2007   a. PE/DVT after neck surgery 2009. b. coumadin d/c 10-2008.  Marland Kitchen PONV (postoperative nausea and vomiting) 2009   neck surgery  . PPD positive   . Recurrent UTI   . RSD (reflex sympathetic dystrophy)    a. Chronic pain.  Marland Kitchen Spinal stenosis   . Type II diabetes mellitus (HCC)    no on medication  . Venous insufficiency    a. Contributing to LEE.   Past Surgical History:  Procedure Laterality Date  . AMPUTATION  03/06/2012   Procedure: AMPUTATION FOOT;  Surgeon: Newt Minion, MD;  Location: [place];  Service: Orthopedics;  Laterality: Left;  FIFTH RAY AMPUTATION   . AMPUTATION Right 02/18/2016   Procedure: AMPUTATION BELOW KNEE;  Surgeon: Newt Minion, MD;  Location: [place];  Service: Orthopedics;  Laterality: Right;  . AMPUTATION Left 11/22/2016   Procedure: Amputation 4th Toe Left Foot at Metatarsophalangeal Joint;  Surgeon: Newt Minion, MD;  Location: [place];  Service: Orthopedics;  Laterality: Left;  . ANKLE FUSION  09/27/2012   Procedure: ANKLE FUSION;   Surgeon: Newt Minion, MD;  Location: [place];  Service: Orthopedics;  Laterality: Left;  Left Tibiocalcaneal Fusion  . ANKLE FUSION Right 05/09/2013   Procedure: ANKLE FUSION;  Surgeon: Newt Minion, MD;  Location: [facility];  Service: Orthopedics;  Laterality: Right;  Excision Osteomyelitis Base 1st MT Right Foot, Fusion Medial Column  . ANTERIOR CERVICAL DECOMP/DISCECTOMY FUSION  12/18/07   For OA,  Dr. Lorin Mercy:  fu by a PE  . ANTERIOR CERVICAL DECOMP/DISCECTOMY FUSION N/A 11/29/2020   Procedure: Anterior Cervical Decompression Fusion - Cervical Three-Cerivcal Four;  Surgeon: Earnie Larsson, MD;  Location: [place];  Service: Neurosurgery;  Laterality: N/A;  3C  . CARDIAC CATHETERIZATION  05/2010    at [facility]  . CARPAL TUNNEL RELEASE Left 09/2011  . CATARACT EXTRACTION W/ INTRAOCULAR LENS  IMPLANT, BILATERAL  2004   feb 2004 left, aug 2004 right  . CHOLECYSTECTOMY  03/2002  . CORONARY ANGIOPLASTY  10/31/2013  . CORONARY ARTERY BYPASS GRAFT  09/27/2016  . CYST REMOVAL HAND  06/2003  . FOOT BONE EXCISION Right 06/2009  . LAPAROSCOPIC RIGHT HEMI COLECTOMY N/A 01/08/2015   Procedure: LAPAROSCOPIC ASSISTED RIGHT HEMI COLECTOMY;  Surgeon: Johnathan Hausen, MD;  Location: [facility];  Service: General;  Laterality: N/A;  . LEFT HEART CATHETERIZATION WITH CORONARY ANGIOGRAM N/A 10/31/2013   Procedure: LEFT HEART CATHETERIZATION WITH CORONARY ANGIOGRAM;  Surgeon: Jettie Booze, MD;  Location: [facility] CATH LAB;  Service: Cardiovascular;  Laterality: N/A;  . NASAL SEPTUM SURGERY  09/1963  . NISSEN FUNDOPLICATION  [id number]  . PERCUTANEOUS CORONARY INTERVENTION-BALLOON ONLY  10/31/2013   Procedure: PERCUTANEOUS CORONARY INTERVENTION-BALLOON ONLY;  Surgeon: Jettie Booze, MD;  Location: [facility] CATH LAB;  Service: Cardiovascular;;  . ROTATOR CUFF REPAIR Right 07/2007   Dr. Percell Miller  . ROTATOR CUFF REPAIR Left 11/2004  . stent in left leg, behind knee  06/27/2017   x2, done at [facility] in [facility]  . TOE  AMPUTATION Right 08/2008   3rd toe, Dr. Sharol Given due to osteomyelitis  . VAGINAL HYSTERECTOMY  03/1975  . VEIN LIGATION Bilateral 03/1966  . VENA CAVA FILTER PLACEMENT  01/2010   green filter; "due to blood clots"  . WISDOM TOOTH EXTRACTION  06/2007   Family History  Problem Relation Age  of Onset  . Heart disease Mother        mitral valve replaced  . Arthritis Mother   . Diabetic kidney disease Daughter   . Diabetes Father   . Stroke Father   . Migraines Sister   . Hypertension Other   . Breast cancer Other   . Colon cancer Neg Hx   . Esophageal cancer Neg Hx   . Stomach cancer Neg Hx   . Rectal cancer Neg Hx    Social History   Socioeconomic History  . Marital status: Divorced    Spouse name: Not on file  . Number of children: 3  . Years of education: 19  . Highest education level: Not on file  Occupational History  . Occupation: Retired     Comment: from Orthoptist   Tobacco Use  . Smoking status: Never Smoker  . Smokeless tobacco: Never Used  . Tobacco comment: tried for a few months in college.   Vaping Use  . Vaping Use: Never used  Substance and Sexual Activity  . Alcohol use: No    Alcohol/week: 0.0 standard drinks  . Drug use: No  . Sexual activity: Not Currently    Birth control/protection: Post-menopausal  Other Topics Concern  . Not on file  Social History Narrative   Daughter [name] deceased 09-26-2020   daughter lives in town   1 son in [place], Clovia Cuff comes home every 4 weeks to visit   Caffeine use:  Tea 1/day w/ dinner   Coffee occass          Currently lives in [place] with son Samyria Mckeithen.   Social Determinants of Health   Financial Resource Strain: Low Risk   . Difficulty of Paying Living Expenses: Not hard at all  Food Insecurity: No Food Insecurity  . Worried About Charity fundraiser in the Last Year: Never true  . Ran Out of Food in the Last Year: Never true  Transportation Needs: No Transportation Needs  . Lack of Transportation  (Medical): No  . Lack of Transportation (Non-Medical): No  Physical Activity: Inactive  . Days of Exercise per Week: 0 days  . Minutes of Exercise per Session: 0 min  Stress: No Stress Concern Present  . Feeling of Stress : Not at all  Social Connections: Socially Isolated  . Frequency of Communication with Friends and Family: More than three times a week  . Frequency of Social Gatherings with Friends and Family: Once a week  . Attends Religious Services: Never  . Active Member of Clubs or Organizations: No  . Attends Archivist Meetings: Never  . Marital Status: Widowed    Tobacco Counseling Counseling given: Not Answered Comment: tried for a few months in college.    Clinical Intake:  Pre-visit preparation completed: Yes  Pain : 0-10 Pain Score: 8  Pain Type: Chronic pain Pain Location: Leg Pain Onset: More than a month ago Pain Frequency: Constant     Nutritional Status: BMI 25 -29 Overweight Nutritional Risks: None Diabetes: Yes CBG done?: No Did pt. bring in CBG monitor from home?: No  How often do you need to have someone help you when you read instructions, pamphlets, or other written materials from your doctor or pharmacy?: 1 - Never  Diabetes:  Is the patient diabetic?  Yes  If diabetic, was a CBG obtained today?  No  Did the patient bring in their glucometer from home?  No  How often do  you monitor your CBG's? never.   Financial Strains and Diabetes Management:  Are you having any financial strains with the device, your supplies or your medication? No .  Does the patient want to be seen by Chronic Care Management for management of their diabetes?  No  Would the patient like to be referred to a Nutritionist or for Diabetic Management?  No   Diabetic Exams:  Diabetic Eye Exam: Completed 08/20/2020.  Diabetic Foot Exam:Pt has been advised about the importance in completing this exam. To be completed by PCP  Interpreter Needed?:  No  Information entered by :: Caroleen Hamman LPN   Activities of Daily Living In your present state of health, do you have any difficulty performing the following activities: 02/24/2021 11/25/2020  Hearing? N Y  Vision? N Y  Difficulty concentrating or making decisions? Tempie Donning  Walking or climbing stairs? Y Y  Dressing or bathing? N N  Doing errands, shopping? Tempie Donning  Preparing Food and eating ? N -  Using the Toilet? N -  In the past six months, have you accidently leaked urine? N -  Do you have problems with loss of bowel control? N -  Managing your Medications? N -  Managing your Finances? N -  Housekeeping or managing your Housekeeping? N -  Some recent data might be hidden    Patient Care Team: Colon Branch, MD as PCP - General Dorothy Spark, MD (Inactive) as Consulting Physician (Cardiology) Melvenia Beam, MD as Consulting Physician (Neurology) Newt Minion, MD as Consulting Physician (Orthopedic Surgery) Elmore Guise, MD as Consulting Physician (Vascular Surgery) Janyth Contes, MD as Consulting Physician (Cardiology) Lonia Skinner, MD as Consulting Physician (Ophthalmology) Festus Aloe, MD as Consulting Physician (Urology) Katy Apo, MD as Consulting Physician (Ophthalmology)  Indicate any recent Medical Services you may have received from other than [facility] providers in the past year (date may be approximate).     Assessment:   This is a routine wellness examination for [name].  Hearing/Vision screen  Hearing Screening   125Hz  250Hz  500Hz  1000Hz  2000Hz  3000Hz  4000Hz  6000Hz  8000Hz   Right ear:           Left ear:           Comments: Pt c/o mild hearing loss  Vision Screening Comments: Wears glasses Last eye exam-07/2020-Dr. Lyles  Dietary issues and exercise activities discussed: Current Exercise Habits: The patient does not participate in regular exercise at present, Exercise limited by: orthopedic condition(s)  Goals Addressed   None     Depression Screen PHQ 2/9 Scores 02/24/2021 01/26/2021 01/30/2020 01/18/2018 12/14/2016 09/18/2016 06/11/2015  PHQ - 2 Score 0 0 0 0 0 0 0  PHQ- 9 Score - 0 - - - - -    Fall Risk Fall Risk  02/24/2021 12/17/2020 01/30/2020 08/22/2019 08/18/2019  Falls in the past year? 1 1 1 1 1   Number falls in past yr: 1 1 1 1 1   Comment - - - - -  Injury with Fall? 0 0 0 1 1  Comment - - - - -  Risk Factor Category  - - - - -  Risk for fall due to : History of fall(s);Impaired mobility - - - -  Follow up Falls prevention discussed - Education provided;Falls prevention discussed Falls evaluation completed;Education provided;Falls prevention discussed -    FALL RISK PREVENTION PERTAINING TO THE HOME:  Any stairs in or around the home? Yes  If so, are there any  without handrails? No  Home free of loose throw rugs in walkways, pet beds, electrical cords, etc? Yes  Adequate lighting in your home to reduce risk of falls? Yes   ASSISTIVE DEVICES UTILIZED TO PREVENT FALLS:  Life alert? No  Use of a cane, walker or w/c? Yes  Grab bars in the bathroom? Yes  Shower chair or bench in shower? Yes  Elevated toilet seat or a handicapped toilet? No   TIMED UP AND GO:  Was the test performed? No . Patient in wheelchair   Cognitive Function:Mild memory changes noted per son. MMSE - Mini Mental State Exam 12/14/2016  Orientation to time 5  Orientation to Place 5  Registration 3  Attention/ Calculation 5  Recall 2  Language- name 2 objects 2  Language- repeat 1  Language- follow 3 step command 3  Language- read & follow direction 1  Write a sentence 1  Copy design 1  Total score 29     6CIT Screen 02/24/2021  What Year? 0 points  What month? 3 points  What time? 0 points  Count back from 20 0 points  Months in reverse 2 points  Repeat phrase 8 points  Total Score 13    Immunizations Immunization History  Administered Date(s) Administered  . Fluad Quad(high Dose 65+) 07/11/2019, 09/22/2020  .  Influenza Split 08/07/2012  . Influenza Whole 07/22/2008, 07/26/2009  . Influenza, High Dose Seasonal PF 10/15/2015, 09/18/2016, 07/23/2017, 08/21/2018  . Influenza, Seasonal, Injecte, Preservative Fre 07/23/2013  . Influenza,inj,Quad PF,6+ Mos 10/06/2014  . PFIZER(Purple Top)SARS-COV-2 Vaccination 12/27/2019, 01/31/2020, 11/01/2020  . Pneumococcal Conjugate-13 10/06/2014  . Pneumococcal Polysaccharide-23 09/28/2010  . Tetanus 09/17/2013  . Zoster 09/18/2011  . Zoster Recombinat (Shingrix) 10/11/2018, 12/12/2018    TDAP status: Up to date  Flu Vaccine status: Up to date  Pneumococcal vaccine status: Up to date  Covid-19 vaccine status: Completed vaccines  Qualifies for Shingles Vaccine? No   Zostavax completed Yes   Shingrix Completed?: Yes  Screening Tests Health Maintenance  Topic Date Due  . INFLUENZA VACCINE  05/23/2021  . HEMOGLOBIN A1C  06/16/2021  . OPHTHALMOLOGY EXAM  08/20/2021  . MAMMOGRAM  01/28/2022  . TETANUS/TDAP  09/18/2023  . DEXA SCAN  Completed  . COVID-19 Vaccine  Completed  . PNA vac Low Risk Adult  Completed  . HPV VACCINES  Aged Out    Health Maintenance  There are no preventive care reminders to display for this patient.  Colorectal cancer screening: No longer required.   Mammogram status: Ordered today. Pt provided with contact info and advised to call to schedule appt.   Bone Density status: Ordered today. Pt provided with contact info and advised to call to schedule appt.  Lung Cancer Screening: (Low Dose CT Chest recommended if Age 38-80 years, 30 pack-year currently smoking OR have quit w/in 15years.) does not qualify.    Additional Screening:  Hepatitis C Screening: does not qualify  Vision Screening: Recommended annual ophthalmology exams for early detection of glaucoma and other disorders of the eye. Is the patient up to date with their annual eye exam?  Yes  Who is the provider or what is the name of the office in which the  patient attends annual eye exams? Dr. Prudencio Burly   Dental Screening: Recommended annual dental exams for proper oral hygiene  Community Resource Referral / Chronic Care Management: CRR required this visit?  No   CCM required this visit?  No      Plan:  I have personally reviewed and noted the following in the patient's chart:   . Medical and social history . Use of alcohol, tobacco or illicit drugs  . Current medications and supplements including opioid prescriptions.  . Functional ability and status . Nutritional status . Physical activity . Advanced directives . List of other physicians . Hospitalizations, surgeries, and ER visits in previous 12 months . Vitals . Screenings to include cognitive, depression, and falls . Referrals and appointments  In addition, I have reviewed and discussed with patient certain preventive protocols, quality metrics, and best practice recommendations. A written personalized care plan for preventive services as well as general preventive health recommendations were provided to patient.   Patient to access avs on my chart  Marta Antu, LPN   [id number]  Nurse Health Advisor  Nurse Notes: None

## 2021-02-24 ENCOUNTER — Other Ambulatory Visit: Payer: Self-pay

## 2021-02-24 ENCOUNTER — Ambulatory Visit (INDEPENDENT_AMBULATORY_CARE_PROVIDER_SITE_OTHER): Payer: Medicare Other

## 2021-02-24 VITALS — BP 122/70 | HR 97 | Temp 97.8°F | Resp 16 | Ht 64.0 in

## 2021-02-24 DIAGNOSIS — Z780 Asymptomatic menopausal state: Secondary | ICD-10-CM | POA: Diagnosis not present

## 2021-02-24 DIAGNOSIS — Z0000 Encounter for general adult medical examination without abnormal findings: Secondary | ICD-10-CM | POA: Diagnosis not present

## 2021-02-24 DIAGNOSIS — Z1231 Encounter for screening mammogram for malignant neoplasm of breast: Secondary | ICD-10-CM

## 2021-02-24 NOTE — Patient Instructions (Signed)
[name] , Thank you for taking time to come for your Medicare Wellness Visit. I appreciate your ongoing commitment to your health goals. Please review the following plan we discussed and let me know if I can assist you in the future.   Screening recommendations/referrals: Colonoscopy: No longer required Mammogram: Ordered today Bone Density: Ordered today Recommended yearly ophthalmology/optometry visit for glaucoma screening and checkup Recommended yearly dental visit for hygiene and checkup  Vaccinations: Influenza vaccine: Up to date Pneumococcal vaccine: Completed vaccines Tdap vaccine: Up to date-Due 09/18/2023 Shingles vaccine: Completed vaccines   Covid-19:Up to date  Advanced directives: Information given today  Conditions/risks identified: See problem list  Next appointment: Follow up in one year for your annual wellness visit    Preventive Care 65 Years and Older, Female Preventive care refers to lifestyle choices and visits with your health care provider that can promote health and wellness. What does preventive care include?  A yearly physical exam. This is also called an annual well check.  Dental exams once or twice a year.  Routine eye exams. Ask your health care provider how often you should have your eyes checked.  Personal lifestyle choices, including:  Daily care of your teeth and gums.  Regular physical activity.  Eating a healthy diet.  Avoiding tobacco and drug use.  Limiting alcohol use.  Practicing safe sex.  Taking low-dose aspirin every day.  Taking vitamin and mineral supplements as recommended by your health care provider. What happens during an annual well check? The services and screenings done by your health care provider during your annual well check will depend on your age, overall health, lifestyle risk factors, and family history of disease. Counseling  Your health care provider may ask you questions about your:  Alcohol  use.  Tobacco use.  Drug use.  Emotional well-being.  Home and relationship well-being.  Sexual activity.  Eating habits.  History of falls.  Memory and ability to understand (cognition).  Work and work Statistician.  Reproductive health. Screening  You may have the following tests or measurements:  Height, weight, and BMI.  Blood pressure.  Lipid and cholesterol levels. These may be checked every 5 years, or more frequently if you are over 14 years old.  Skin check.  Lung cancer screening. You may have this screening every year starting at age 75 if you have a 30-pack-year history of smoking and currently smoke or have quit within the past 15 years.  Fecal occult blood test (FOBT) of the stool. You may have this test every year starting at age 63.  Flexible sigmoidoscopy or colonoscopy. You may have a sigmoidoscopy every 5 years or a colonoscopy every 10 years starting at age 73.  Hepatitis C blood test.  Hepatitis B blood test.  Sexually transmitted disease (STD) testing.  Diabetes screening. This is done by checking your blood sugar (glucose) after you have not eaten for a while (fasting). You may have this done every 1-3 years.  Bone density scan. This is done to screen for osteoporosis. You may have this done starting at age 48.  Mammogram. This may be done every 1-2 years. Talk to your health care provider about how often you should have regular mammograms. Talk with your health care provider about your test results, treatment options, and if necessary, the need for more tests. Vaccines  Your health care provider may recommend certain vaccines, such as:  Influenza vaccine. This is recommended every year.  Tetanus, diphtheria, and acellular pertussis (Tdap,  Td) vaccine. You may need a Td booster every 10 years.  Zoster vaccine. You may need this after age 63.  Pneumococcal 13-valent conjugate (PCV13) vaccine. One dose is recommended after age  25.  Pneumococcal polysaccharide (PPSV23) vaccine. One dose is recommended after age 35. Talk to your health care provider about which screenings and vaccines you need and how often you need them. This information is not intended to replace advice given to you by your health care provider. Make sure you discuss any questions you have with your health care provider. Document Released: 11/05/2015 Document Revised: 06/28/2016 Document Reviewed: 08/10/2015 Elsevier Interactive Patient Education  2017 [place] Prevention in the Home Falls can cause injuries. They can happen to people of all ages. There are many things you can do to make your home safe and to help prevent falls. What can I do on the outside of my home?  Regularly fix the edges of walkways and driveways and fix any cracks.  Remove anything that might make you trip as you walk through a door, such as a raised step or threshold.  Trim any bushes or trees on the path to your home.  Use bright outdoor lighting.  Clear any walking paths of anything that might make someone trip, such as rocks or tools.  Regularly check to see if handrails are loose or broken. Make sure that both sides of any steps have handrails.  Any raised decks and porches should have guardrails on the edges.  Have any leaves, snow, or ice cleared regularly.  Use sand or salt on walking paths during winter.  Clean up any spills in your garage right away. This includes oil or grease spills. What can I do in the bathroom?  Use night lights.  Install grab bars by the toilet and in the tub and shower. Do not use towel bars as grab bars.  Use non-skid mats or decals in the tub or shower.  If you need to sit down in the shower, use a plastic, non-slip stool.  Keep the floor dry. Clean up any water that spills on the floor as soon as it happens.  Remove soap buildup in the tub or shower regularly.  Attach bath mats securely with double-sided  non-slip rug tape.  Do not have throw rugs and other things on the floor that can make you trip. What can I do in the bedroom?  Use night lights.  Make sure that you have a light by your bed that is easy to reach.  Do not use any sheets or blankets that are too big for your bed. They should not hang down onto the floor.  Have a firm chair that has side arms. You can use this for support while you get dressed.  Do not have throw rugs and other things on the floor that can make you trip. What can I do in the kitchen?  Clean up any spills right away.  Avoid walking on wet floors.  Keep items that you use a lot in easy-to-reach places.  If you need to reach something above you, use a strong step stool that has a grab bar.  Keep electrical cords out of the way.  Do not use floor polish or wax that makes floors slippery. If you must use wax, use non-skid floor wax.  Do not have throw rugs and other things on the floor that can make you trip. What can I do with my stairs?  Do not  leave any items on the stairs.  Make sure that there are handrails on both sides of the stairs and use them. Fix handrails that are broken or loose. Make sure that handrails are as long as the stairways.  Check any carpeting to make sure that it is firmly attached to the stairs. Fix any carpet that is loose or worn.  Avoid having throw rugs at the top or bottom of the stairs. If you do have throw rugs, attach them to the floor with carpet tape.  Make sure that you have a light switch at the top of the stairs and the bottom of the stairs. If you do not have them, ask someone to add them for you. What else can I do to help prevent falls?  Wear shoes that:  Do not have high heels.  Have rubber bottoms.  Are comfortable and fit you well.  Are closed at the toe. Do not wear sandals.  If you use a stepladder:  Make sure that it is fully opened. Do not climb a closed stepladder.  Make sure that both  sides of the stepladder are locked into place.  Ask someone to hold it for you, if possible.  Clearly mark and make sure that you can see:  Any grab bars or handrails.  First and last steps.  Where the edge of each step is.  Use tools that help you move around (mobility aids) if they are needed. These include:  Canes.  Walkers.  Scooters.  Crutches.  Turn on the lights when you go into a dark area. Replace any light bulbs as soon as they burn out.  Set up your furniture so you have a clear path. Avoid moving your furniture around.  If any of your floors are uneven, fix them.  If there are any pets around you, be aware of where they are.  Review your medicines with your doctor. Some medicines can make you feel dizzy. This can increase your chance of falling. Ask your doctor what other things that you can do to help prevent falls. This information is not intended to replace advice given to you by your health care provider. Make sure you discuss any questions you have with your health care provider. Document Released: 08/05/2009 Document Revised: 03/16/2016 Document Reviewed: 11/13/2014 Elsevier Interactive Patient Education  2017 [employer].

## 2021-02-25 DIAGNOSIS — H40053 Ocular hypertension, bilateral: Secondary | ICD-10-CM | POA: Diagnosis not present

## 2021-03-08 DIAGNOSIS — M810 Age-related osteoporosis without current pathological fracture: Secondary | ICD-10-CM | POA: Diagnosis not present

## 2021-03-08 DIAGNOSIS — Z803 Family history of malignant neoplasm of breast: Secondary | ICD-10-CM | POA: Diagnosis not present

## 2021-03-08 DIAGNOSIS — Z1231 Encounter for screening mammogram for malignant neoplasm of breast: Secondary | ICD-10-CM | POA: Diagnosis not present

## 2021-03-08 LAB — HM MAMMOGRAPHY

## 2021-03-08 LAB — HM DEXA SCAN

## 2021-03-09 ENCOUNTER — Encounter: Payer: Self-pay | Admitting: Internal Medicine

## 2021-03-10 ENCOUNTER — Encounter: Payer: Self-pay | Admitting: Internal Medicine

## 2021-03-10 ENCOUNTER — Encounter: Payer: Self-pay | Admitting: Gastroenterology

## 2021-03-10 ENCOUNTER — Other Ambulatory Visit: Payer: Self-pay | Admitting: Neurosurgery

## 2021-03-10 ENCOUNTER — Ambulatory Visit (INDEPENDENT_AMBULATORY_CARE_PROVIDER_SITE_OTHER): Payer: Medicare Other | Admitting: Gastroenterology

## 2021-03-10 VITALS — BP 122/70 | HR 82 | Ht 64.0 in

## 2021-03-10 DIAGNOSIS — R1031 Right lower quadrant pain: Secondary | ICD-10-CM

## 2021-03-10 DIAGNOSIS — K625 Hemorrhage of anus and rectum: Secondary | ICD-10-CM | POA: Diagnosis not present

## 2021-03-10 DIAGNOSIS — Z7901 Long term (current) use of anticoagulants: Secondary | ICD-10-CM

## 2021-03-10 DIAGNOSIS — K582 Mixed irritable bowel syndrome: Secondary | ICD-10-CM | POA: Diagnosis not present

## 2021-03-10 DIAGNOSIS — L89310 Pressure ulcer of right buttock, unstageable: Secondary | ICD-10-CM

## 2021-03-10 DIAGNOSIS — L89320 Pressure ulcer of left buttock, unstageable: Secondary | ICD-10-CM | POA: Diagnosis not present

## 2021-03-10 DIAGNOSIS — S32040A Wedge compression fracture of fourth lumbar vertebra, initial encounter for closed fracture: Secondary | ICD-10-CM

## 2021-03-10 MED ORDER — HYDROCORTISONE ACETATE 25 MG RE SUPP: 25.0000 mg | Freq: Two times a day (BID) | RECTAL | 5 refills | Status: DC

## 2021-03-10 MED ORDER — HYDROCORTISONE (PERIANAL) 2.5 % EX CREA: 1.0000 "application " | TOPICAL_CREAM | Freq: Two times a day (BID) | CUTANEOUS | 3 refills | Status: DC

## 2021-03-10 NOTE — Progress Notes (Signed)
[name]    [id number]    12/12/1934  Primary Care Physician:Paz, Alda Berthold, MD  Referring Physician: Colon Branch, MD [address] [place],  [place] [address]   Chief complaint: Lower abdominal pain, rectal bleeding  HPI:  86 year old very pleasant femalewith history of CAD, and STEMI status post CABG, peripheral vascular disease status post partial amputation of left lower leg with prosthesis on chronic antiplatelet and anticoagulation here for follow-up visit for evaluation of rectal bleeding on and off for past 5 years. She is accompanied by her son. According to her son she has been more depressed since she lost her sister in August and her daughter in November last year and her condition  has been deteriorating since then  She is having lower abdominal pain on the right side.Also c/o rectal discomfort and pain, worse with movement and when she defecates She is having dark black bowel movement, is on oral iron twice a day  Alternating constipation and diarrhea.  According to son she has been complaining of diarrhea more than constipation and she seemed to be moving her bowels though he is not sure if she is having any formed bowel movement  She is s/p hemorrhoidal band ligation in 2017.  She was evaluated by [place] [facility] for hemorrhoidectomy, was thought not to be a candidate for surgical repair.  CT abdomen and pelvis with contrast 12/24/2015 1. No acute abdominopelvic findings. 2. Colonic diverticulosis without evidence of acute diverticulitis. 3. Single bubble of air within the urinary bladder. Correlate for history of recent instrumentation. 4. Postsurgical changes at the gastroesophageal junction from Nissen fundoplication with small recurrent hiatal hernia.  ColonoscopyMarch 2017 with internal and external hemorrhoids, left-sided diverticulosis, removal of 3 subcentimeter sessile and pedunculated polyps [tubular adenomas]   Outpatient  Encounter Medications as of 03/10/2021  Medication Sig  . Alpha-Lipoic Acid 600 MG CAPS Take 1,200 mg by mouth daily.  Marland Kitchen amitriptyline (ELAVIL) 25 MG tablet Take 1 tablet (25 mg total) by mouth at bedtime.  . Ascorbic Acid (VITAMIN C WITH ROSE HIPS) 500 MG tablet Take 500 mg by mouth in the morning, at noon, and at bedtime.  Marland Kitchen atorvastatin (LIPITOR) 40 MG tablet Take 40 mg by mouth daily.  . carvedilol (COREG) 12.5 MG tablet Take 12.5 mg by mouth 2 (two) times daily with a meal.  . Cholecalciferol (VITAMIN D3) 5000 UNITS CAPS Take 5,000 Units by mouth every evening. Reported on 11/24/2015  . clopidogrel (PLAVIX) 75 MG tablet Take 1 tablet (75 mg total) by mouth at bedtime. Restart on 12/04/2020  . Cyanocobalamin (VITAMIN B-12) 500 MCG SUBL Place 5,000 mcg under the tongue daily.  . cyclobenzaprine (FLEXERIL) 10 MG tablet Take 1 tablet (10 mg total) by mouth 3 (three) times daily as needed for muscle spasms. (Patient not taking: No sig reported)  . diclofenac Sodium (VOLTAREN) 1 % GEL Apply 2-4 g topically 4 (four) times daily as needed. (Patient taking differently: Apply 2-4 g topically 4 (four) times daily as needed (joint pain).)  . dicyclomine (BENTYL) 10 MG capsule Take 1 capsule (10 mg total) by mouth 3 (three) times daily before meals. NEEDS APPT  . famotidine (PEPCID) 20 MG tablet Take 1 tablet (20 mg total) by mouth 2 (two) times daily.  . ferrous sulfate (FEROSUL) 325 (65 FE) MG tablet Take 1 tablet (325 mg total) by mouth 2 (two) times daily with a meal.  .  furosemide (LASIX) 20 MG tablet Take 20 mg by mouth daily.  . Galcanezumab-gnlm (EMGALITY) 120 MG/ML SOAJ Inject 120 mg into the skin every 30 (thirty) days.  Marland Kitchen glucose blood (FREESTYLE LITE) test strip USE TO CHECK BLOOD SUGAR NO MORE THAN TWICE DAILY  . HYDROcodone-acetaminophen (NORCO) 10-325 MG tablet Take 1 tablet by mouth every 6 (six) hours as needed.  . Lancets (FREESTYLE) lancets CHECK BLOOD SUGAR TWICE DAILY  . loperamide  (IMODIUM A-D) 2 MG tablet Take 2 mg by mouth as needed for diarrhea or loose stools.  . lubiprostone ([name]) 24 MCG capsule Take 1 capsule (24 mcg total) by mouth 2 (two) times daily with a meal.  . MegaRed Omega-3 Krill Oil 500 MG CAPS Restart on 12/04/2020  . methenamine (HIPREX) 1 g tablet Take 0.5 tablets by mouth daily.  . Multiple Vitamins-Minerals (PRESERVISION AREDS 2) CAPS Take 1 capsule by mouth 2 (two) times daily.  . naloxone (NARCAN) nasal spray 4 mg/0.1 mL Place 1 spray into the nose once as needed for up to 1 dose. (Patient not taking: No sig reported)  . nitroGLYCERIN (NITROSTAT) 0.4 MG SL tablet Place 1 tablet (0.4 mg total) under the tongue every 5 (five) minutes x 3 doses as needed for chest pain. (Patient not taking: No sig reported)  . omeprazole (PRILOSEC) 20 MG capsule Take 1 capsule (20 mg total) by mouth 2 (two) times daily before a meal.  . ondansetron (ZOFRAN-ODT) 4 MG disintegrating tablet Take 1 tablet (4 mg total) by mouth every 8 (eight) hours as needed for nausea or vomiting.  Marland Kitchen OVER THE COUNTER MEDICATION Take 1 capsule by mouth 2 (two) times daily as needed (digestive issues). Integrative Digestive Formula  . OVER THE COUNTER MEDICATION 1 tablet 3 (three) times daily. Berberorine Gluco Defense  . Polyvinyl Alcohol-Povidone (REFRESH OP) Place 1 drop into both eyes 3 (three) times daily as needed (dry eyes).   . pramipexole (MIRAPEX) 0.125 MG tablet Take 1-2 tablets (0.125-0.25 mg total) by mouth at bedtime as needed. (Patient taking differently: Take 0.125-0.25 mg by mouth at bedtime as needed (restless leg).)  . rivaroxaban (XARELTO) 2.5 MG TABS tablet Take 1 tablet (2.5 mg total) by mouth 2 (two) times daily. Restart on 12/04/2020  . silver sulfADIAZINE (SILVADENE) 1 % cream Apply 1 application topically daily.  . vitamin E 180 MG (400 UNITS) capsule Take 400 Units by mouth daily.  . [DISCONTINUED] pregabalin (LYRICA) 75 MG capsule Take 75 mg by mouth 2 (two) times  daily.   No facility-administered encounter medications on file as of 03/10/2021.    Allergies as of 03/10/2021 - Review Complete 03/10/2021  Allergen Reaction Noted  . Cymbalta [duloxetine hcl] Swelling 09/25/2012  . Gabapentin Swelling 09/25/2012  . Silicone Hives, Itching, Dermatitis, and Rash   . Metformin and related Other (See Comments) 12/10/2015  . Adhesive [tape] Rash 07/17/2011  . Cefazolin Hives 03/06/2012  . Ciprofloxacin Other (See Comments) 01/31/2013  . Clorazepate dipotassium Other (See Comments)   . Dilaudid [hydromorphone hcl] Other (See Comments) 03/11/2010  . Doxycycline Rash 11/03/2008  . Enablex [darifenacin hydrobromide er] Other (See Comments) 03/06/2012  . Levofloxacin Other (See Comments) 01/08/2014  . Lyrica [pregabalin] Swelling 05/08/2013  . Methadone hcl Other (See Comments)   . Morphine and related Other (See Comments) 10/31/2013  . Talwin [pentazocine] Other (See Comments) 12/04/2012    Past Medical History:  Diagnosis Date  . Allergic rhinitis   . Arthritis    "back; ankles; hands; knees" (10/31/2013)  .  Bilateral sensorineural hearing loss   . CAD (coronary artery disease)    a. Nonobst in 2011. b. Abnormal nuc 09/2013 -> s/p cutting balloon to D2, mild LAD disease; c. 08/2015 MV: no ischemia, EF 71%.  . Carcinoma in situ in a polyp 1994   a. 1994 - malignant polyp removed during colonoscopy.  . Chronic diastolic CHF (congestive heart failure) ([facility])    a. 09/2013 Echo: EF 60-65%, mild LVH, Gr1 DD.  Marland Kitchen Dementia ([place])   . Diverticulosis   . DJD (degenerative joint disease)   . Fatty liver   . GERD (gastroesophageal reflux disease)    a. Hx GERD/esophageal dysmotility followed by Dr. Olevia Perches.   Marland Kitchen Hemorrhoids   . Hiatal hernia    a. s/p Nissen fundoplication [id number].  Marland Kitchen Hyperlipidemia    a. patient unwilling to use statins.  . Hypertensive heart disease   . IBS (irritable bowel syndrome)   . Macular degeneration 03/2009   Dr. Rosana Hoes  .  Neuropathy    a. Hands, feet, legs.  . Orthostatic hypotension   . Osteomyelitis ([place])    a. 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 01/2016 s/p RLE transtibial amputation 2/2 Charcot rocker-bottom deformity and insensate neuropathy ulceration.  . Osteoporosis   . PE (pulmonary embolism) 12/2007   a. PE/DVT after neck surgery 2009. b. coumadin d/c 10-2008.  Marland Kitchen PONV (postoperative nausea and vomiting) 2009   neck surgery  . PPD positive   . Recurrent UTI   . RSD (reflex sympathetic dystrophy)    a. Chronic pain.  Marland Kitchen Spinal stenosis   . Type II diabetes mellitus (H[facility])    no on medication  . Venous insufficiency    a. Contributing to LEE.    Past Surgical History:  Procedure Laterality Date  . AMPUTATION  03/06/2012   Procedure: AMPUTATION FOOT;  Surgeon: Newt Minion, MD;  Location: [facility];  Service: Orthopedics;  Laterality: Left;  FIFTH RAY AMPUTATION   . AMPUTATION Right 02/18/2016   Procedure: AMPUTATION BELOW KNEE;  Surgeon: Newt Minion, MD;  Location: [facility];  Service: Orthopedics;  Laterality: Right;  . AMPUTATION Left 11/22/2016   Procedure: Amputation 4th Toe Left Foot at Metatarsophalangeal Joint;  Surgeon: Newt Minion, MD;  Location: [facility];  Service: Orthopedics;  Laterality: Left;  . ANKLE FUSION  09/27/2012   Procedure: ANKLE FUSION;  Surgeon: Newt Minion, MD;  Location: [facility];  Service: Orthopedics;  Laterality: Left;  Left Tibiocalcaneal Fusion  . ANKLE FUSION Right 05/09/2013   Procedure: ANKLE FUSION;  Surgeon: Newt Minion, MD;  Location: [facility];  Service: Orthopedics;  Laterality: Right;  Excision Osteomyelitis Base 1st MT Right Foot, Fusion Medial Column  . ANTERIOR CERVICAL DECOMP/DISCECTOMY FUSION  12/18/07   For OA,  Dr. Lorin Mercy:  fu by a PE  . ANTERIOR CERVICAL DECOMP/DISCECTOMY FUSION N/A 11/29/2020   Procedure: Anterior Cervical Decompression Fusion - Cervical Three-Cerivcal Four;  Surgeon: Earnie Larsson, MD;   Location: [facility];  Service: Neurosurgery;  Laterality: N/A;  3C  . CARDIAC CATHETERIZATION  05/2010    at [facility]  . CARPAL TUNNEL RELEASE Left 09/2011  . CATARACT EXTRACTION W/ INTRAOCULAR LENS  IMPLANT, BILATERAL  2004   feb 2004 left, aug 2004 right  . CHOLECYSTECTOMY  03/2002  . CORONARY ANGIOPLASTY  10/31/2013  . CORONARY ARTERY BYPASS GRAFT  09/27/2016  . CYST REMOVAL HAND  06/2003  . FOOT BONE EXCISION  Right 06/2009  . LAPAROSCOPIC RIGHT HEMI COLECTOMY N/A 01/08/2015   Procedure: LAPAROSCOPIC ASSISTED RIGHT HEMI COLECTOMY;  Surgeon: Johnathan Hausen, MD;  Location: [facility];  Service: General;  Laterality: N/A;  . LEFT HEART CATHETERIZATION WITH CORONARY ANGIOGRAM N/A 10/31/2013   Procedure: LEFT HEART CATHETERIZATION WITH CORONARY ANGIOGRAM;  Surgeon: Jettie Booze, MD;  Location[facility] C[facility] LAB;  Service: Cardiovascular;  Laterality: N/A;  . NASAL SEPTUM SURGERY  09/1963  . NISSEN FUNDOPLICATION  12/28/1694  . PERCUTANEOUS CORONARY INTERVENTION-BALLOON ONLY  10/31/2013   Procedure: PERCUTANEOUS CORONARY INTERVENTION-BALLOON ONLY;  Surgeon: Jettie Booze, MD;  Location: [facility] CATH LAB;  Service: Cardiovascular;;  . ROTATOR CUFF REPAIR Right 07/2007   Dr. Percell Miller  . ROTATOR CUFF REPAIR Left 11/2004  . stent in left leg, behind knee  06/27/2017   x2, done at [facility] cardiology in [facility]  . TOE AMPUTATION Right 01-Sep-2008   3rd toe, Dr. Sharol Given due to osteomyelitis  . VAGINAL HYSTERECTOMY  03/1975  . VEIN LIGATION Bilateral 03/1966  . VENA CAVA FILTER PLACEMENT  01/2010   green filter; "due to blood clots"  . WISDOM TOOTH EXTRACTION  06/2007    Family History  Problem Relation Age of Onset  . Heart disease Mother        mitral valve replaced  . Arthritis Mother   . Diabetic kidney disease Daughter   . Diabetes Father   . Stroke Father   . Migraines Sister   . Hypertension Other   . Breast cancer Other   . Colon cancer Neg Hx   . Esophageal cancer Neg Hx   . Stomach cancer Neg Hx    . Rectal cancer Neg Hx     Social History   Socioeconomic History  . Marital status: Divorced    Spouse name: Not on file  . Number of children: 3  . Years of education: 53  . Highest education level: Not on file  Occupational History  . Occupation: Retired     Comment: from Orthoptist   Tobacco Use  . Smoking status: Never Smoker  . Smokeless tobacco: Never Used  . Tobacco comment: tried for a few months in college.   Vaping Use  . Vaping Use: Never used  Substance and Sexual Activity  . Alcohol use: No    Alcohol/week: 0.0 standard drinks  . Drug use: No  . Sexual activity: Not Currently    Birth control/protection: Post-menopausal  Other Topics Concern  . Not on file  Social History Narrative   Daughter [name] deceased 09/01/2020   daughter lives in town   1 son in [place], Clovia Cuff comes home every 4 weeks to visit   Caffeine use:  Tea 1/day w/ dinner   Coffee occass          Currently lives in [place] with son Layton Tappan.   Social Determinants of Health   Financial Resource Strain: Low Risk   . Difficulty of Paying Living Expenses: Not hard at all  Food Insecurity: No Food Insecurity  . Worried About Charity fundraiser in the Last Year: Never true  . Ran Out of Food in the Last Year: Never true  Transportation Needs: No Transportation Needs  . Lack of Transportation (Medical): No  . Lack of Transportation (Non-Medical): No  Physical Activity: Inactive  . Days of Exercise per Week: 0 days  . Minutes of Exercise per Session: 0 min  Stress: No Stress Concern Present  .  Feeling of Stress : Not at all  Social Connections: Socially Isolated  . Frequency of Communication with Friends and Family: More than three times a week  . Frequency of Social Gatherings with Friends and Family: Once a week  . Attends Religious Services: Never  . Active Member of Clubs or Organizations: No  . Attends Archivist Meetings: Never  . Marital Status: Widowed   Intimate Partner Violence: Not At Risk  . Fear of Current or Ex-Partner: No  . Emotionally Abused: No  . Physically Abused: No  . Sexually Abused: No      Review of systems: All other review of systems negative except as mentioned in the HPI.   Physical Exam: Vitals:   03/10/21 1101  BP: 122/70  Pulse: 82  SpO2: 97%   Body mass index is 29.18 kg/m. Gen:      No acute distress, wheelchair-bound, prosthetic leg HEENT:  sclera anicteric Abd:      soft, non-tender; no palpable masses, no distension Ext:    No edema Neuro: alert and oriented x 3 Psych: normal mood and affect Rectal exam: Perianal pressure decubitus ulcer, unstageable.  Large internal and external hemorrhoids  Media Information         Document Information  Photos    03/10/2021 11:42  Attached To:  Office Visit on 03/10/21 with Mauri Pole, MD   Source Information  Mauri Pole, MD  [facility]    Data Reviewed:  Reviewed labs, radiology imaging, old records and pertinent past GI work up   Assessment and Plan/Recommendations:  86 year old female with CAD s/p CABG, peripheral vascular disease s/p left leg amputation on Plavix and Xarelto here with complaints of intermittent rectal bleeding and right lower quadrant abdominal pain  Rectal bleeding: Likely secondary to small-volume hemorrhage from internal hemorrhoids in the setting of dual antiplatelet therapy and anticoagulation She is not a candidate for hemorrhoidal band ligation or hemorrhoidectomy We will try to conservatively manage with Anusol cream and suppositories as needed Avoid excessive straining during defecation  Perianal decubitus ulcer R>L buttocks, unable to stage based on exam Advised patient to turn in bed from side to side every 2-3 hours and avoid sitting or laying in a single position for prolonged period of time Her daughter is helping with wound management, she is a Equities trader. She  has follow-up appointment with Dr. Larose Kells next week  Right lower quadrant abdominal pain: We will obtain CT abdomen pelvis without contrast to exclude any acute pathology  IBS with constipation and diarrhea: Use MiraLAX 1 capful daily as needed  Increase dietary fiber and water intake   Return as needed  This visit required>40 minutes of patient care (this includes precharting, chart review, review of results, face-to-face time used for counseling as well as treatment plan and follow-up. The patient was provided an opportunity to ask questions and all were answered. The patient agreed with the plan and demonstrated an understanding of the instructions.  Damaris Hippo , MD    CC: Colon Branch, MD

## 2021-03-10 NOTE — Patient Instructions (Signed)
You have been scheduled for a CT scan of the abdomen and pelvis at [facility] are scheduled on 5/26 at [facility] should arrive 15 minutes prior to your appointment time for registration. Please follow the written instructions below on the day of your exam:    1) Do not eat or drink anything after 7am (4 hours prior to your test) 2) You have been given 2 bottles of oral contrast to drink. The solution may taste better if refrigerated, but do NOT add ice or any other liquid to this solution. Shake well before drinking.    Drink 1 bottle of contrast @ 9am (2 hours prior to your exam)  Drink 1 bottle of contrast @ 10am (1 hour prior to your exam)  You may take any medications as prescribed with a small amount of water, if necessary. If you take any of the following medications: METFORMIN, GLUCOPHAGE, GLUCOVANCE, AVANDAMET, RIOMET, FORTAMET, [place] MET, JANUMET, GLUMETZA or METAGLIP, you MAY be asked to HOLD this medication 48 hours AFTER the exam.  The purpose of you drinking the oral contrast is to aid in the visualization of your intestinal tract. The contrast solution may cause some diarrhea. Depending on your individual set of symptoms, you may also receive an intravenous injection of x-ray contrast/dye. Plan on being at [facility] for 30 minutes or longer, depending on the type of exam you are having performed.  This test typically takes 30-45 minutes to complete.  If you have any questions regarding your exam or if you need to reschedule, you may call the CT department at [phone] between the hours of 8:00 am and 5:00 pm, Monday-Friday.  ________________________________________________________________________  We will send Anusol suppositories and cream to your pharmacy  Turn from side to side in bed every 2-4 hours  Due to recent changes in healthcare laws, you may see the results of your imaging and laboratory studies on MyChart before your provider has  had a chance to review them.  We understand that in some cases there may be results that are confusing or concerning to you. Not all laboratory results come back in the same time frame and the provider may be waiting for multiple results in order to interpret others.  Please give [place] 48 hours in order for your provider to thoroughly review all the results before contacting the office for clarification of your results.   Thank you for choosing [facility]  Karleen Hampshire Nand[place]am,MD

## 2021-03-14 ENCOUNTER — Other Ambulatory Visit: Payer: Self-pay | Admitting: Internal Medicine

## 2021-03-14 MED ORDER — HYDROCODONE-ACETAMINOPHEN 10-325 MG PO TABS: 1.0000 | ORAL_TABLET | Freq: Four times a day (QID) | ORAL | 0 refills | Status: DC | PRN

## 2021-03-14 NOTE — Telephone Encounter (Signed)
Requesting: Hydrocodone  Contract: 09/22/20 UDS: 07/11/19 Last Visit:  01/26/2021 Next Visit: 03/16/21 Last Refill: 12/28/20  Please Advise

## 2021-03-14 NOTE — Telephone Encounter (Signed)
Medication: HYDROcodone-acetaminophen (NORCO) 10-325 MG tablet [[id number]]       Has the patient contacted their pharmacy? no (If no, request that the patient contact the pharmacy for the refill.) (If yes, when and what did the pharmacy advise?)     Preferred Pharmacy (with phone number or street name): [facility], [address] AT [address]  [address] [place] [address]  Phone:  [phone] Fax:  [phone]      Agent: Please be advised that RX refills may take up to 3 business days. We ask that you follow-up with your pharmacy.

## 2021-03-16 ENCOUNTER — Encounter: Payer: Self-pay | Admitting: Internal Medicine

## 2021-03-16 ENCOUNTER — Ambulatory Visit (INDEPENDENT_AMBULATORY_CARE_PROVIDER_SITE_OTHER): Payer: Medicare Other | Admitting: Internal Medicine

## 2021-03-16 ENCOUNTER — Other Ambulatory Visit: Payer: Self-pay

## 2021-03-16 VITALS — BP 132/82 | HR 66 | Temp 97.9°F | Resp 18 | Ht 64.0 in | Wt 170.5 lb

## 2021-03-16 DIAGNOSIS — E1142 Type 2 diabetes mellitus with diabetic polyneuropathy: Secondary | ICD-10-CM

## 2021-03-16 DIAGNOSIS — L89302 Pressure ulcer of unspecified buttock, stage 2: Secondary | ICD-10-CM | POA: Diagnosis not present

## 2021-03-16 DIAGNOSIS — Z89511 Acquired absence of right leg below knee: Secondary | ICD-10-CM

## 2021-03-16 DIAGNOSIS — E782 Mixed hyperlipidemia: Secondary | ICD-10-CM | POA: Diagnosis not present

## 2021-03-16 NOTE — Progress Notes (Signed)
Subjective:    Patient ID: [name], female    DOB: 1935/01/22, 86 y.o.   MRN: [id number]  DOS:  03/16/2021 Type of visit - description: Follow-up, here with her son Dellis Filbert.  Today with talk about multiple issues including:  pain management. Pressure ulcers at the buttocks. Completed paperwork for her to get diabetic shoes. The son is also concerned about some mental status changes, she seems to be confused at times. No recent fever, chills.  No LUTS.  No recent head injury I also reviewed a note from GI.  Review of Systems See above   Past Medical History:  Diagnosis Date  . Allergic rhinitis   . Arthritis    "back; ankles; hands; knees" (10/31/2013)  . Bilateral sensorineural hearing loss   . CAD (coronary artery disease)    a. Nonobst in 2011. b. Abnormal nuc 09/2013 -> s/p cutting balloon to D2, mild LAD disease; c. 08/2015 MV: no ischemia, EF 71%.  . Carcinoma in situ in a polyp 1994   a. 1994 - malignant polyp removed during colonoscopy.  . Chronic diastolic CHF (congestive heart failure) ([place])    a. 09/2013 Echo: EF 60-65%, mild LVH, Gr1 DD.  Marland Kitchen Dementia ([place])   . Diverticulosis   . DJD (degenerative joint disease)   . Fatty liver   . GERD (gastroesophageal reflux disease)    a. Hx GERD/esophageal dysmotility followed by Dr. Olevia Perches.   Marland Kitchen Hemorrhoids   . Hiatal hernia    a. s/p Nissen fundoplication 8563.  Marland Kitchen Hyperlipidemia    a. patient unwilling to use statins.  . Hypertensive heart disease   . IBS (irritable bowel syndrome)   . Macular degeneration 03/2009   Dr. Rosana Hoes  . Neuropathy    a. Hands, feet, legs.  . Orthostatic hypotension   . Osteomyelitis ([place])    a. 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 01/2016 s/p RLE transtibial amputation 2/2 Charcot rocker-bottom deformity and insensate neuropathy ulceration.  . Osteoporosis   . PE (pulmonary embolism) 12/2007   a. PE/DVT after neck surgery 2009.  b. coumadin d/c 10-2008.  Marland Kitchen PONV (postoperative nausea and vomiting) 2009   neck surgery  . PPD positive   . Recurrent UTI   . RSD (reflex sympathetic dystrophy)    a. Chronic pain.  Marland Kitchen Spinal stenosis   . Type II diabetes mellitus (HCC)    no on medication  . Venous insufficiency    a. Contributing to LEE.    Past Surgical History:  Procedure Laterality Date  . AMPUTATION  03/06/2012   Procedure: AMPUTATION FOOT;  Surgeon: Newt Minion, MD;  Location: [facility];  Service: Orthopedics;  Laterality: Left;  FIFTH RAY AMPUTATION   . AMPUTATION Right 02/18/2016   Procedure: AMPUTATION BELOW KNEE;  Surgeon: Newt Minion, MD;  Location: [facility];  Service: Orthopedics;  Laterality: Right;  . AMPUTATION Left 11/22/2016   Procedure: Amputation 4th Toe Left Foot at Metatarsophalangeal Joint;  Surgeon: Newt Minion, MD;  Location: [facility];  Service: Orthopedics;  Laterality: Left;  . ANKLE FUSION  09/27/2012   Procedure: ANKLE FUSION;  Surgeon: Newt Minion, MD;  Location: [facility];  Service: Orthopedics;  Laterality: Left;  Left Tibiocalcaneal Fusion  . ANKLE FUSION Right 05/09/2013   Procedure: ANKLE FUSION;  Surgeon: Newt Minion, MD;  Location: [facility];  Service: Orthopedics;  Laterality: Right;  Excision Osteomyelitis Base 1st MT Right Foot, Fusion  Medial Column  . ANTERIOR CERVICAL DECOMP/DISCECTOMY FUSION  12/18/07   For OA,  Dr. Lorin Mercy:  fu by a PE  . ANTERIOR CERVICAL DECOMP/DISCECTOMY FUSION N/A 11/29/2020   Procedure: Anterior Cervical Decompression Fusion - Cervical Three-Cerivcal Four;  Surgeon: Earnie Larsson, MD;  Location: [facility];  Service: Neurosurgery;  Laterality: N/A;  3C  . CARDIAC CATHETERIZATION  05/2010    at [facility]  . CARPAL TUNNEL RELEASE Left 09/2011  . CATARACT EXTRACTION W/ INTRAOCULAR LENS  IMPLANT, BILATERAL  2004   feb 2004 left, aug 2004 right  . CHOLECYSTECTOMY  03/2002  . CORONARY ANGIOPLASTY  10/31/2013  . CORONARY ARTERY BYPASS GRAFT  09/27/2016  . CYST REMOVAL HAND  06/2003   . FOOT BONE EXCISION Right 06/2009  . LAPAROSCOPIC RIGHT HEMI COLECTOMY N/A 01/08/2015   Procedure: LAPAROSCOPIC ASSISTED RIGHT HEMI COLECTOMY;  Surgeon: Johnathan Hausen, MD;  Location: [facility];  Service: General;  Laterality: N/A;  . LEFT HEART CATHETERIZATION WITH CORONARY ANGIOGRAM N/A 10/31/2013   Procedure: LEFT HEART CATHETERIZATION WITH CORONARY ANGIOGRAM;  Surgeon: Jettie Booze, MD;  Location: [facility] CATH [facility];  Service: Cardiovascular;  Laterality: N/A;  . NASAL SEPTUM SURGERY  09/1963  . NISSEN FUNDOPLICATION  05/24/9936  . PERCUTANEOUS CORONARY INTERVENTION-BALLOON ONLY  10/31/2013   Procedure: PERCUTANEOUS CORONARY INTERVENTION-BALLOON ONLY;  Surgeon: Jettie Booze, MD;  Location: [facility] C[facility] LAB;  Service: Cardiovascular;;  . ROTATOR CUFF REPAIR Right 07/2007   Dr. Percell Miller  . ROTATOR CUFF REPAIR Left 11/2004  . stent in left leg, behind knee  06/27/2017   x2, done at [facility]  . TOE AMPUTATION Right 08/2008   3rd toe, Dr. Sharol Given due to osteomyelitis  . VAGINAL HYSTERECTOMY  03/1975  . VEIN LIGATION Bilateral 03/1966  . VENA CAVA FILTER PLACEMENT  01/2010   green filter; "due to blood clots"  . WISDOM TOOTH EXTRACTION  06/2007    Allergies as of 03/16/2021      Reactions   Cymbalta [duloxetine Hcl] Swelling   Swelling in legs   Gabapentin Swelling   Swelling in legs Swelling in legs   Silicone Hives, Itching, Dermatitis, Rash   Metformin And Related Other (See Comments)   dizzy, tired, chills, diarrhea, and nausea   Adhesive [tape] Rash   rash rash   Cefazolin Hives   Hives Hives   Ciprofloxacin Other (See Comments)   Other reaction(s): Other (See Comments) Per pt, caused body aches Per pt, caused body aches   Clorazepate Dipotassium Other (See Comments)   Unknown reaction   Dilaudid [hydromorphone Hcl] Other (See Comments)    confused, intense itching   Doxycycline Rash   Enablex [darifenacin Hydrobromide Er] Other (See Comments)    Hypotension, near syncope   Levofloxacin Other (See Comments)   Causes wrist pain   Lyrica [pregabalin] Swelling   Swelling in legs   Methadone Hcl Other (See Comments)   Reaction unknown   Morphine And Related Other (See Comments)   Confusion, constipation.   Talwin [pentazocine] Other (See Comments)   "climbing walls" anxiety      Medication List       Accurate as of Mar 16, 2021 11:59 PM. If you have any questions, ask your nurse or doctor.        STOP taking these medications   cyclobenzaprine 10 MG tablet Commonly known as: FLEXERIL Stopped by: [name], MD   pregabalin 75 MG capsule Commonly known as: LYRICA Stopped by: [name], MD  silver sulfADIAZINE 1 % cream Commonly known as: SILVADENE Stopped by: [name], MD     TAKE these medications   Alpha-Lipoic Acid 600 MG Caps Take 1,200 mg by mouth daily.   amitriptyline 25 MG tablet Commonly known as: ELAVIL Take 1 tablet (25 mg total) by mouth at bedtime.   atorvastatin 40 MG tablet Commonly known as: LIPITOR Take 40 mg by mouth daily.   carvedilol 12.5 MG tablet Commonly known as: COREG Take 12.5 mg by mouth 2 (two) times daily with a meal.   clopidogrel 75 MG tablet Commonly known as: PLAVIX Take 1 tablet (75 mg total) by mouth at bedtime. Restart on 12/04/2020   diclofenac Sodium 1 % Gel Commonly known as: VOLTAREN Apply 2-4 g topically 4 (four) times daily as needed. What changed: reasons to take this   dicyclomine 10 MG capsule Commonly known as: BENTYL Take 1 capsule (10 mg total) by mouth 3 (three) times daily before meals. NEEDS APPT   Emgality 120 MG/ML Soaj Generic drug: Galcanezumab-gnlm Inject 120 mg into the skin every 30 (thirty) days.   famotidine 20 MG tablet Commonly known as: PEPCID Take 1 tablet (20 mg total) by mouth 2 (two) times daily.   ferrous sulfate 325 (65 FE) MG tablet Commonly known as: FeroSul Take 1 tablet (325 mg total) by mouth 2 (two) times daily with a  meal.   freestyle lancets CHECK BLOOD SUGAR TWICE DAILY   FREESTYLE LITE test strip Generic drug: glucose blood USE TO CHECK BLOOD SUGAR NO MORE THAN TWICE DAILY   furosemide 20 MG tablet Commonly known as: LASIX Take 20 mg by mouth daily.   HYDROcodone-acetaminophen 10-325 MG tablet Commonly known as: NORCO Take 1 tablet by mouth every 6 (six) hours as needed.   hydrocortisone 2.5 % rectal cream Commonly known as: ANUSOL-HC Place 1 application rectally 2 (two) times daily. As needed   hydrocortisone 25 MG suppository Commonly known as: ANUSOL-HC Place 1 suppository (25 mg total) rectally 2 (two) times daily. As needed   loperamide 2 MG tablet Commonly known as: IMODIUM A-D Take 2 mg by mouth as needed for diarrhea or loose stools.   lubiprostone 24 MCG capsule Commonly known as: [name] Take 1 capsule (24 mcg total) by mouth 2 (two) times daily with a meal.   MegaRed Omega-3 Krill Oil 500 MG Caps Restart on 12/04/2020   methenamine 1 g tablet Commonly known as: HIPREX Take 0.5 tablets by mouth daily.   naloxone 4 MG/0.1ML Liqd nasal spray kit Commonly known as: NARCAN Place 1 spray into the nose once as needed for up to 1 dose.   nitroGLYCERIN 0.4 MG SL tablet Commonly known as: NITROSTAT Place 1 tablet (0.4 mg total) under the tongue every 5 (five) minutes x 3 doses as needed for chest pain.   omeprazole 20 MG capsule Commonly known as: PRILOSEC Take 1 capsule (20 mg total) by mouth 2 (two) times daily before a meal.   ondansetron 4 MG disintegrating tablet Commonly known as: ZOFRAN-ODT Take 1 tablet (4 mg total) by mouth every 8 (eight) hours as needed for nausea or vomiting.   OVER THE COUNTER MEDICATION 1 tablet 3 (three) times daily. Berberorine Gluco Defense   OVER THE COUNTER MEDICATION Take 1 capsule by mouth 2 (two) times daily as needed (digestive issues). Integrative Digestive Formula   pramipexole 0.125 MG tablet Commonly known as:  MIRAPEX Take 1-2 tablets (0.125-0.25 mg total) by mouth at bedtime as needed. What changed: reasons  to take this   PreserVision AREDS 2 Caps Take 1 capsule by mouth 2 (two) times daily.   REFRESH OP Place 1 drop into both eyes 3 (three) times daily as needed (dry eyes).   rivaroxaban 2.5 MG Tabs tablet Commonly known as: XARELTO Take 1 tablet (2.5 mg total) by mouth 2 (two) times daily. Restart on 12/04/2020   Vitamin B-12 500 MCG Subl Place 5,000 mcg under the tongue daily.   vitamin C with rose hips 500 MG tablet Take 500 mg by mouth in the morning, at noon, and at bedtime.   Vitamin D3 125 MCG (5000 UT) Caps Take 5,000 Units by mouth every evening. Reported on 11/24/2015   vitamin E 180 MG (400 UNITS) capsule Take 400 Units by mouth daily.          Objective:   Physical Exam BP 132/82 (BP Location: Left Arm, Patient Position: Sitting, Cuff Size: Small)   Pulse 66   Temp 97.9 F (36.6 C) (Oral)   Resp 18   Ht 5' 4"  (1.626 m)   Wt 170 lb 8 oz (77.3 kg) Comment: w/ prosthesis  BMI 29.27 kg/m  General:   Well developed, NAD, BMI noted. HEENT:  Normocephalic . Face symmetric, atraumatic Lungs:  CTA B Normal respiratory effort, no intercostal retractions, no accessory muscle use. Heart: RRR,  no murmur.  Lower extremities: Status post BKA R, status post fourth and fifth L toe amputations. Skin: See picture, has a stage I and stage II type of lesions at the buttocks, without warmness, pockets, abscess, odor or discharge at that I can tell Neurologic:  alert & oriented to self, situation, month, she thought she was on the third floor (this is the second floor) Speech normal Psych--  Behavior appropriate. No anxious or depressed appearing.         Assessment     ASSESMENT DM, Neuropathy, amputations due to Charcot feet. - metformin intolerant  (lethargic) HTN  Hyperlipidemia LUNG MASS -incidental per CT 2009,  PET scan 01-2008: likely  benign, CT 08-2009  no change, no further CTs per Dr Gwenette Greet - Had a CT in [place] 09-2016 "spiculated mass", saw Dr Lamonte Sakai,  CT  09/2017 stable CV: ---CAD sees Dr Meda Coffee and a cardiologist in [facility] --NSTEMI 09-2016. Outside hospital: Cath 2 vessel disease, CABG 2, LIMA to LAD and Diag   ---Chronic diastolic CHF ---PVD: Aortogram and a stent L Leg Dr Feliberto Gottron, 06-27-2017 @ [place] (per pt) GI: GERD, IBS, colon polyps, PUD, abdominal pain "post polypectomy syndrome" naueas meds prn (antivert-zofran, gets nausea when car traveling) Osteoporosis  MSK,pain mngmt : --Reflex sympathetic dystrophy --Neuropathy --DJD --Spinal stenosis  --H/o Osteomyelitis  Foot --s/p amputation:  4th 5th L toes , R BKA  (01-2016, dx osteomyelitis) w/ phantom pain  -- sees Dr Sharol Given prn She had local injections with Dr. Maia Petties 2016 w/o apparent help. She took oxycodone after surgery and that did NOT seem to help better than hydrocodone. Intolerant to Cymbalta, Neurontin ; Lyrica helped but caused edema. Recurrent UTIs: Dr Junious Silk  Venous insufficiency  H/o  + PPD H/o hypothyroidism: TSHs stable H/o dyspnea   PLAN:  DM: Diet controlled, check A1c Diabetic shoes paperwork completed. High cholesterol: On atorvastatin, check FLP Pressure ulcers, stage I and II: She seems a slightly worse compared to about 6 weeks ago, now both buttocks are compromised.  The family is doing the best they can turning her frequently but have not acquired a doughnut pillow or a  sheepskin. Offered referral to our wound care center but that we will be very difficult for them to pursue.  We agreed on conservative treatment, see AVS, definitely seek medical attention if things are getting worse. Dementia?  She gets confused at times, today she did try to tell me about her nurse I supposedly  Know but I don't so she was slightly confused.  This started approximately 2 months ago, no recent head injury, no LUTS or fever.  She is taking multiple medications, at this  point I do not think adding a medicine such as Aricept would be on her benefit. Pain management: Since the last visit, saw Dr. Boyd Kerbs, he prescribed Lyrica, it helped the pain however the patient developed lower extremity edema so they stopped it.  Currently taking hydrocodone typically twice daily.  Recommend to continue discussing pain management with them, she is a scheduled to have a cervical epidural steroid injection soon. RTC 4 months  Time spent 40 minutes, assessing and advising about pressure ulcers, also dementia, a new issue. Extensive discussion about pain management as well. Paperwork completed  This visit occurred during the SARS-CoV-2 public health emergency.  Safety protocols were in place, including screening questions prior to the visit, additional usage of staff PPE, and extensive cleaning of exam room while observing appropriate contact time as indicated for disinfecting solutions.

## 2021-03-16 NOTE — Patient Instructions (Signed)
For the pressure ulcer: Frequent turning Ship skin Donut pillow Watch for signs of worsening, infection, fever, redness.  If that is the case seek immediate medical attention. If gradually getting worse you will need to see a wound care specialist   GO TO THE LAB : Get the blood work     [place], [place] back for a checkup in 4 months     Pressure Injury  A pressure injury is damage to the skin and underlying tissue that results from pressure being applied to an area of the body. It often affects people who must spend a long time in a bed or chair because of a medical condition. Pressure injuries usually occur:  Over bony parts of the body, such as the tailbone, shoulders, elbows, hips, heels, spine, ankles, and back of the head.  Under medical devices that make contact with the body, such as respiratory equipment, stockings, tubes, and splints. Pressure injuries start as reddened areas on the skin and can lead to pain and an open wound. What are the causes? This condition is caused by frequent or constant pressure to an area of the body. Decreased blood flow to the skin can eventually cause the skin tissue to die and break down, causing a wound. What increases the risk? You are more likely to develop this condition if you:  Are in the hospital or an extended care facility.  Are bedridden or in a wheelchair.  Have an injury or disease that keeps you from: ? Moving normally. ? Feeling pain or pressure.  Have a condition that: ? Makes you sleepy or less alert. ? Causes poor blood flow.  Need to wear a medical device.  Have poor control of your bladder or bowel functions (incontinence).  Have poor nutrition (malnutrition). If you are at risk for pressure injuries, your health care provider may recommend certain types of mattresses, mattress covers, pillows, cushions, or boots to help prevent them. These may include products  filled with air, foam, gel, or sand. What are the signs or symptoms? Symptoms of this condition depend on the severity of the injury. Symptoms may include:  Red or dark areas of the skin.  Pain, warmth, or a change of skin texture.  Blisters.  An open wound. How is this diagnosed? This condition is diagnosed with a medical history and physical exam. You may also have tests, such as:  Blood tests.  Imaging tests.  Blood flow tests. Your pressure injury will be staged based on its severity. Staging is based on:  The depth of the tissue injury, including whether there is exposure of muscle, bone, or tendon.  The cause of the pressure injury. How is this treated? This condition may be treated by:  Relieving or redistributing pressure on your skin. This includes: ? Frequently changing your position. ? Avoiding positions that caused the wound or that can make the wound worse. ? Using specific bed mattresses, chair cushions, or protective boots. ? Moving medical devices from an area of pressure, or placing padding between the skin and the device. ? Using foams, creams, or powders to prevent rubbing (friction) on the skin.  Keeping your skin clean and dry. This may include using a skin cleanser or skin barrier as told by your health care provider.  Cleaning your injury and removing any dead tissue from the wound (debridement).  Placing a bandage (dressing) over your injury.  Using medicines for pain or to prevent or  treat infection. Surgery may be needed if other treatments are not working or if your injury is very deep. Follow these instructions at home: Wound care  Follow instructions from your health care provider about how to take care of your wound. Make sure you: ? Wash your hands with soap and water before and after you change your bandage (dressing). If soap and water are not available, use hand sanitizer. ? Change your dressing as told by your health care  provider.  Check your wound every day for signs of infection. Have a caregiver do this for you if you are not able. Check for: ? Redness, swelling, or increased pain. ? More fluid or blood. ? Warmth. ? Pus or a bad smell. Skin care  Keep your skin clean and dry. Gently pat your skin dry.  Do not rub or massage your skin.  You or a caregiver should check your skin every day for any changes in color or any new blisters or sores (ulcers). Medicines  Take over-the-counter and prescription medicines only as told by your health care provider.  If you were prescribed an antibiotic medicine, take or apply it as told by your health care provider. Do not stop using the antibiotic even if your condition improves. Reducing and redistributing pressure  Do not lie or sit in one position for a long time. Move or change position every 1-2 hours, or as told by your health care provider.  Use pillows or cushions to reduce pressure. Ask your health care provider to recommend cushions or pads for you. General instructions  Eat a healthy diet that includes lots of protein.  Drink enough fluid to keep your urine pale yellow.  Be as active as you can every day. Ask your health care provider to suggest safe exercises or activities.  Do not abuse drugs or alcohol.  Do not use any products that contain nicotine or tobacco, such as cigarettes, e-cigarettes, and chewing tobacco. If you need help quitting, ask your health care provider.  Keep all follow-up visits as told by your health care provider. This is important.   Contact a health care provider if:  You have: ? A fever or chills. ? Pain that is not helped by medicine. ? Any changes in skin color. ? New blisters or sores. ? Pus or a bad smell coming from your wound. ? Redness, swelling, or pain around your wound. ? More fluid or blood coming from your wound.  Your wound does not improve after 1-2 weeks of treatment. Summary  A pressure  injury is damage to the skin and underlying tissue that results from pressure being applied to an area of the body.  Do not lie or sit in one position for a long time. Your health care provider may advise you to move or change position every 1-2 hours.  Follow instructions from your health care provider about how to take care of your wound.  Keep all follow-up visits as told by your health care provider. This is important. This information is not intended to replace advice given to you by your health care provider. Make sure you discuss any questions you have with your health care provider. Document Revised: 05/08/2018 Document Reviewed: 05/08/2018 Elsevier Patient Education  [place].

## 2021-03-17 ENCOUNTER — Ambulatory Visit (HOSPITAL_BASED_OUTPATIENT_CLINIC_OR_DEPARTMENT_OTHER): Payer: Medicare Other

## 2021-03-17 NOTE — Assessment & Plan Note (Signed)
DM: Diet controlled, check A1c Diabetic shoes paperwork completed. High cholesterol: On atorvastatin, check FLP Pressure ulcers, stage I and II: She seems a slightly worse compared to about 6 weeks ago, now both buttocks are compromised.  The family is doing the best they can turning her frequently but have not acquired a doughnut pillow or a sheepskin. Offered referral to our wound care center but that we will be very difficult for them to pursue.  We agreed on conservative treatment, see AVS, definitely seek medical attention if things are getting worse. Dementia?  She gets confused at times, today she did try to tell me about her nurse I supposedly  Know but I don't so she was slightly confused.  This started approximately 2 months ago, no recent head injury, no LUTS or fever.  She is taking multiple medications, at this point I do not think adding a medicine such as Aricept would be on her benefit. Pain management: Since the last visit, saw Dr. Boyd Kerbs, he prescribed Lyrica, it helped the pain however the patient developed lower extremity edema so they stopped it.  Currently taking hydrocodone typically twice daily.  Recommend to continue discussing pain management with them, she is a scheduled to have a cervical epidural steroid injection soon. RTC 4 months

## 2021-03-17 NOTE — Addendum Note (Signed)
Addended by: [name] E on: 03/17/2021 06:40 PM   Modules accepted: Level of Service

## 2021-03-18 ENCOUNTER — Other Ambulatory Visit (INDEPENDENT_AMBULATORY_CARE_PROVIDER_SITE_OTHER): Payer: Medicare Other

## 2021-03-18 ENCOUNTER — Other Ambulatory Visit: Payer: Self-pay

## 2021-03-18 ENCOUNTER — Ambulatory Visit (HOSPITAL_BASED_OUTPATIENT_CLINIC_OR_DEPARTMENT_OTHER)
Admission: RE | Admit: 2021-03-18 | Discharge: 2021-03-18 | Disposition: A | Discharge status: Home / Self Care | Payer: Medicare Other | Source: Ambulatory Visit | Attending: Gastroenterology | Admitting: Gastroenterology

## 2021-03-18 DIAGNOSIS — E782 Mixed hyperlipidemia: Secondary | ICD-10-CM | POA: Diagnosis not present

## 2021-03-18 DIAGNOSIS — E1142 Type 2 diabetes mellitus with diabetic polyneuropathy: Secondary | ICD-10-CM

## 2021-03-18 DIAGNOSIS — K5730 Diverticulosis of large intestine without perforation or abscess without bleeding: Secondary | ICD-10-CM | POA: Diagnosis not present

## 2021-03-18 DIAGNOSIS — R1031 Right lower quadrant pain: Secondary | ICD-10-CM | POA: Diagnosis not present

## 2021-03-18 LAB — LIPID PANEL
Cholesterol: 95 mg/dL (ref 0–200)
HDL: 35.1 mg/dL — ABNORMAL LOW (ref >39.00)
LDL Cholesterol: 28 mg/dL (ref 0–99)
NonHDL: 59.86
Total CHOL/HDL Ratio: 3
Triglycerides: 160 mg/dL — ABNORMAL HIGH (ref 0.0–149.0)
VLDL: 32 mg/dL (ref 0.0–40.0)

## 2021-03-18 LAB — HEMOGLOBIN A1C: Hgb A1c MFr Bld: 5.6 % (ref 4.6–6.5)

## 2021-03-22 ENCOUNTER — Telehealth: Payer: Self-pay

## 2021-03-22 NOTE — Telephone Encounter (Signed)
Diabetic shoe form completed and faxed to [facility] in [place], [place] at [phone]. Form sent for scanning.

## 2021-03-23 DIAGNOSIS — M5413 Radiculopathy, cervicothoracic region: Secondary | ICD-10-CM | POA: Diagnosis not present

## 2021-03-23 DIAGNOSIS — M5412 Radiculopathy, cervical region: Secondary | ICD-10-CM | POA: Diagnosis not present

## 2021-03-23 DIAGNOSIS — G629 Polyneuropathy, unspecified: Secondary | ICD-10-CM | POA: Diagnosis not present

## 2021-03-30 DIAGNOSIS — I83893 Varicose veins of bilateral lower extremities with other complications: Secondary | ICD-10-CM | POA: Diagnosis not present

## 2021-03-30 DIAGNOSIS — I70213 Atherosclerosis of native arteries of extremities with intermittent claudication, bilateral legs: Secondary | ICD-10-CM | POA: Diagnosis not present

## 2021-03-30 DIAGNOSIS — I700 Atherosclerosis of aorta: Secondary | ICD-10-CM | POA: Diagnosis not present

## 2021-04-01 ENCOUNTER — Encounter: Payer: Self-pay | Admitting: Internal Medicine

## 2021-04-05 DIAGNOSIS — I10 Essential (primary) hypertension: Secondary | ICD-10-CM | POA: Diagnosis not present

## 2021-04-05 DIAGNOSIS — I872 Venous insufficiency (chronic) (peripheral): Secondary | ICD-10-CM | POA: Diagnosis not present

## 2021-04-05 DIAGNOSIS — I700 Atherosclerosis of aorta: Secondary | ICD-10-CM | POA: Diagnosis not present

## 2021-04-05 DIAGNOSIS — I871 Compression of vein: Secondary | ICD-10-CM | POA: Diagnosis not present

## 2021-04-08 DIAGNOSIS — I10 Essential (primary) hypertension: Secondary | ICD-10-CM | POA: Diagnosis not present

## 2021-04-08 DIAGNOSIS — I871 Compression of vein: Secondary | ICD-10-CM | POA: Diagnosis not present

## 2021-04-08 DIAGNOSIS — I872 Venous insufficiency (chronic) (peripheral): Secondary | ICD-10-CM | POA: Diagnosis not present

## 2021-04-08 DIAGNOSIS — I700 Atherosclerosis of aorta: Secondary | ICD-10-CM | POA: Diagnosis not present

## 2021-05-03 ENCOUNTER — Ambulatory Visit
Admission: RE | Admit: 2021-05-03 | Discharge: 2021-05-03 | Disposition: A | Discharge status: Home / Self Care | Payer: Medicare Other | Source: Ambulatory Visit | Attending: Neurosurgery | Admitting: Neurosurgery

## 2021-05-03 ENCOUNTER — Other Ambulatory Visit: Payer: Self-pay

## 2021-05-03 DIAGNOSIS — S32040A Wedge compression fracture of fourth lumbar vertebra, initial encounter for closed fracture: Secondary | ICD-10-CM

## 2021-05-03 DIAGNOSIS — M545 Low back pain, unspecified: Secondary | ICD-10-CM | POA: Diagnosis not present

## 2021-05-03 DIAGNOSIS — M48061 Spinal stenosis, lumbar region without neurogenic claudication: Secondary | ICD-10-CM | POA: Diagnosis not present

## 2021-05-05 DIAGNOSIS — M4802 Spinal stenosis, cervical region: Secondary | ICD-10-CM | POA: Diagnosis not present

## 2021-05-11 ENCOUNTER — Telehealth: Payer: Self-pay | Admitting: Internal Medicine

## 2021-05-11 MED ORDER — HYDROCODONE-ACETAMINOPHEN 10-325 MG PO TABS: 1.0000 | ORAL_TABLET | Freq: Four times a day (QID) | ORAL | 0 refills | Status: DC | PRN

## 2021-05-11 NOTE — Telephone Encounter (Signed)
Patient would like a pill instead of desintegrating  ondansetron (ZOFRAN-ODT)    HYDROcodone-acetaminophen ([place]) 10-325 MG tablet [[id number]]   [facility] [place], [address] AT [address] Phone:  [phone]  Fax:  [phone]

## 2021-05-11 NOTE — Telephone Encounter (Signed)
Pt is requesting change from Zofran ODT to regular tablet. Please advise.    Requesting: hydrocodone 10-325mg  Contract: 09/22/2020 UDS: 06/11/2020 Last Visit:03/16/2021 Next Visit: 07/19/2021 Last Refill: 03/14/2021 #120 and 0RF Pt sig: 1 tab q6h prn  Please Advise

## 2021-05-11 NOTE — Telephone Encounter (Signed)
Okay to change Zofran as requested. Hydrocodone prescription sent, PDMP okay.

## 2021-05-12 MED ORDER — ONDANSETRON HCL 4 MG PO TABS: 4.0000 mg | ORAL_TABLET | Freq: Three times a day (TID) | ORAL | 0 refills | Status: DC | PRN

## 2021-05-12 NOTE — Telephone Encounter (Signed)
Rx sent 

## 2021-05-27 DIAGNOSIS — M47812 Spondylosis without myelopathy or radiculopathy, cervical region: Secondary | ICD-10-CM | POA: Diagnosis not present

## 2021-06-01 ENCOUNTER — Emergency Department (HOSPITAL_COMMUNITY): Payer: Medicare Other

## 2021-06-01 ENCOUNTER — Other Ambulatory Visit: Payer: Self-pay

## 2021-06-01 ENCOUNTER — Emergency Department (HOSPITAL_COMMUNITY)
Admission: EM | Admit: 2021-06-01 | Discharge: 2021-06-02 | Disposition: A | Discharge status: Home / Self Care | Payer: Medicare Other | Attending: Emergency Medicine | Admitting: Emergency Medicine

## 2021-06-01 ENCOUNTER — Encounter (HOSPITAL_COMMUNITY): Payer: Self-pay | Admitting: Emergency Medicine

## 2021-06-01 DIAGNOSIS — I2510 Atherosclerotic heart disease of native coronary artery without angina pectoris: Secondary | ICD-10-CM | POA: Insufficient documentation

## 2021-06-01 DIAGNOSIS — E1140 Type 2 diabetes mellitus with diabetic neuropathy, unspecified: Secondary | ICD-10-CM | POA: Diagnosis not present

## 2021-06-01 DIAGNOSIS — F0390 Unspecified dementia without behavioral disturbance: Secondary | ICD-10-CM | POA: Insufficient documentation

## 2021-06-01 DIAGNOSIS — I5033 Acute on chronic diastolic (congestive) heart failure: Secondary | ICD-10-CM | POA: Diagnosis not present

## 2021-06-01 DIAGNOSIS — M545 Low back pain, unspecified: Secondary | ICD-10-CM | POA: Diagnosis not present

## 2021-06-01 DIAGNOSIS — Z955 Presence of coronary angioplasty implant and graft: Secondary | ICD-10-CM | POA: Diagnosis not present

## 2021-06-01 DIAGNOSIS — I110 Hypertensive heart disease with heart failure: Secondary | ICD-10-CM | POA: Diagnosis not present

## 2021-06-01 DIAGNOSIS — Z7901 Long term (current) use of anticoagulants: Secondary | ICD-10-CM | POA: Insufficient documentation

## 2021-06-01 DIAGNOSIS — R300 Dysuria: Secondary | ICD-10-CM | POA: Diagnosis present

## 2021-06-01 DIAGNOSIS — N3001 Acute cystitis with hematuria: Secondary | ICD-10-CM | POA: Insufficient documentation

## 2021-06-01 DIAGNOSIS — M19041 Primary osteoarthritis, right hand: Secondary | ICD-10-CM | POA: Diagnosis not present

## 2021-06-01 DIAGNOSIS — Z951 Presence of aortocoronary bypass graft: Secondary | ICD-10-CM | POA: Insufficient documentation

## 2021-06-01 DIAGNOSIS — Z7902 Long term (current) use of antithrombotics/antiplatelets: Secondary | ICD-10-CM | POA: Diagnosis not present

## 2021-06-01 DIAGNOSIS — Z79899 Other long term (current) drug therapy: Secondary | ICD-10-CM | POA: Diagnosis not present

## 2021-06-01 LAB — CBC WITH DIFFERENTIAL/PLATELET
Abs Immature Granulocytes: 0.01 10*3/uL (ref 0.00–0.07)
Basophils Absolute: 0 10*3/uL (ref 0.0–0.1)
Basophils Relative: 0 %
Eosinophils Absolute: 0.1 10*3/uL (ref 0.0–0.5)
Eosinophils Relative: 1 %
HCT: 37 % (ref 36.0–46.0)
Hemoglobin: 11.5 g/dL — ABNORMAL LOW (ref 12.0–15.0)
Immature Granulocytes: 0 %
Lymphocytes Relative: 20 %
Lymphs Abs: 1 10*3/uL (ref 0.7–4.0)
MCH: 29.8 pg (ref 26.0–34.0)
MCHC: 31.1 g/dL (ref 30.0–36.0)
MCV: 95.9 fL (ref 80.0–100.0)
Monocytes Absolute: 0.3 10*3/uL (ref 0.1–1.0)
Monocytes Relative: 7 %
Neutro Abs: 3.6 10*3/uL (ref 1.7–7.7)
Neutrophils Relative %: 72 %
Platelets: 140 10*3/uL — ABNORMAL LOW (ref 150–400)
RBC: 3.86 MIL/uL — ABNORMAL LOW (ref 3.87–5.11)
RDW: 14.4 % (ref 11.5–15.5)
WBC: 5 10*3/uL (ref 4.0–10.5)
nRBC: 0 % (ref 0.0–0.2)

## 2021-06-01 LAB — COMPREHENSIVE METABOLIC PANEL
ALT: 8 U/L (ref 0–44)
AST: 11 U/L — ABNORMAL LOW (ref 15–41)
Albumin: 3.5 g/dL (ref 3.5–5.0)
Alkaline Phosphatase: 37 U/L — ABNORMAL LOW (ref 38–126)
Anion gap: 4 — ABNORMAL LOW (ref 5–15)
BUN: 20 mg/dL (ref 8–23)
CO2: 21 mmol/L — ABNORMAL LOW (ref 22–32)
Calcium: 8.3 mg/dL — ABNORMAL LOW (ref 8.9–10.3)
Chloride: 113 mmol/L — ABNORMAL HIGH (ref 98–111)
Creatinine, Ser: 1.29 mg/dL — ABNORMAL HIGH (ref 0.44–1.00)
GFR, Estimated: 41 mL/min — ABNORMAL LOW (ref >60)
Glucose, Bld: 136 mg/dL — ABNORMAL HIGH (ref 70–99)
Potassium: 3.9 mmol/L (ref 3.5–5.1)
Sodium: 138 mmol/L (ref 135–145)
Total Bilirubin: 0.6 mg/dL (ref 0.3–1.2)
Total Protein: 5.8 g/dL — ABNORMAL LOW (ref 6.5–8.1)

## 2021-06-01 MED ORDER — HYDROCODONE-ACETAMINOPHEN 5-325 MG PO TABS
2.0000 | ORAL_TABLET | Freq: Once | ORAL | Status: AC
  Administered 2021-06-01: 2 via ORAL
  Filled 2021-06-01: qty 2

## 2021-06-01 NOTE — ED Triage Notes (Signed)
Pt states she has been having pain in her low back on both sides, her shoulders, and her arms and hands. Pt states that she fell last week. Pt states that it is painful for her to urinate and that she has had some bleeding when she urinates. Pt seems confused and cannot recall if she is more confused than normal.

## 2021-06-01 NOTE — ED Provider Notes (Addendum)
Emergency Medicine Provider Triage Evaluation Note  [name] , a 86 y.o. female  was evaluated in triage.  Pt complains of dysuria, back pain, fever x 10-20 days.  The back pain is primarily to her flanks, worse with urination.  She is also having hematuria.  She says she is having a difficult time articulating because she is stricken with grief because her sister just died.  When asked if she has been more confused or acting differently than normal, she states her son think she is acting confused.   Son states she has been confused this past year. This confusion is not dramatically different than what has become her new normal.   Review of Systems  Positive: Dysuria, hematuria, flank pain, fever, confusion? Negative: Shortness of breath, chest pain  Physical Exam  BP 121/66 (BP Location: Left Arm)   Pulse 83   Temp 98.6 F (37 C) (Oral)   Resp 18   SpO2 100%  Gen:   Awake, no distress   Resp:  Normal effort  MSK:   Moves extremities without difficulty  Other:  Bilateral CVA tenderness  Medical Decision Making  Medically screening exam initiated at 8:47 PM.  Appropriate orders placed.  ARMELIA GROVEN was informed that the remainder of the evaluation will be completed by another provider, this initial triage assessment does not replace that evaluation, and the importance of remaining in the ED until their evaluation is complete.     Sherrill Raring, PA-C 06/01/21 2049    Sherrill Raring, PA-C 06/01/21 2052    Sherrill Raring, PA-C 06/01/21 2052    Daleen Bo, MD 06/01/21 2350

## 2021-06-02 DIAGNOSIS — M19041 Primary osteoarthritis, right hand: Secondary | ICD-10-CM | POA: Diagnosis not present

## 2021-06-02 DIAGNOSIS — N3001 Acute cystitis with hematuria: Secondary | ICD-10-CM | POA: Diagnosis not present

## 2021-06-02 DIAGNOSIS — M545 Low back pain, unspecified: Secondary | ICD-10-CM | POA: Diagnosis not present

## 2021-06-02 LAB — URINALYSIS, COMPLETE (UACMP) WITH MICROSCOPIC
Bilirubin Urine: NEGATIVE
Glucose, UA: NEGATIVE mg/dL
Ketones, ur: NEGATIVE mg/dL
Nitrite: NEGATIVE
Protein, ur: NEGATIVE mg/dL
Specific Gravity, Urine: 1.005 (ref 1.005–1.030)
WBC, UA: >50 WBC/hpf — ABNORMAL HIGH (ref 0–5)
pH: 5 (ref 5.0–8.0)

## 2021-06-02 MED ORDER — FOSFOMYCIN TROMETHAMINE 3 G PO PACK
3.0000 g | PACK | Freq: Once | ORAL | Status: AC
  Administered 2021-06-02: 3 g via ORAL
  Filled 2021-06-02: qty 3

## 2021-06-02 NOTE — ED Provider Notes (Signed)
[facility] DEPT Provider Note   CSN: [id number] Arrival date & time: 06/01/21  2025     History Chief Complaint  Patient presents with   Back Pain   Hematuria    LYDIANNA GIANGRANDE is a 86 y.o. female.  The history is provided by the patient, a relative and a caregiver.  Back Pain Location:  Lumbar spine Quality:  Aching Radiates to:  Does not radiate Pain severity:  Moderate Onset quality:  Gradual Duration: over a week. Progression:  Worsening Chronicity:  New Relieved by: rest. Worsened by:  Ambulation Associated symptoms: dysuria and fever   Associated symptoms: no abdominal pain and no chest pain   Hematuria Pertinent negatives include no chest pain, no abdominal pain and no shortness of breath.  Patient with extensive history presents with back pain.  She has had low back pain for over a week.  She does report that she fell over a week ago but is unclear what caused the fall.  Patient lives with her son, and he only found her on the floor.  She has not had imaging of her back since that time.  She is reported increasing lower back pain.  No new weakness.  No head injuries.  She has reported dysuria and possible hematuria.  She has had previous UTIs that have presented similarly before  Son is at bedside.  The biggest concern is that she may have a UTI, and they reported she had a fever up to 99 at home which is unusual for her     Past Medical History:  Diagnosis Date   Allergic rhinitis    Arthritis    "back; ankles; hands; knees" (10/31/2013)   Bilateral sensorineural hearing loss    CAD (coronary artery disease)    a. Nonobst in 2011. b. Abnormal nuc 09/2013 -> s/p cutting balloon to D2, mild LAD disease; c. 08/2015 MV: no ischemia, EF 71%.   Carcinoma in situ in a polyp 1994   a. 1994 - malignant polyp removed during colonoscopy.   Chronic diastolic CHF (congestive heart failure) ([place])    a. 09/2013 Echo: EF 60-65%, mild LVH, Gr1 DD.    Dementia ([place])    Diverticulosis    DJD (degenerative joint disease)    Fatty liver    GERD (gastroesophageal reflux disease)    a. Hx GERD/esophageal dysmotility followed by Dr. Olevia Perches.    Hemorrhoids    Hiatal hernia    a. s/p Nissen fundoplication [id number].   Hyperlipidemia    a. patient unwilling to use statins.   Hypertensive heart disease    IBS (irritable bowel syndrome)    Macular degeneration 03/2009   Dr. Rosana Hoes   Neuropathy    a. Hands, feet, legs.   Orthostatic hypotension    Osteomyelitis (HCC)    a. 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 01/2016 s/p RLE transtibial amputation 2/2 Charcot rocker-bottom deformity and insensate neuropathy ulceration.   Osteoporosis    PE (pulmonary embolism) 12/2007   a. PE/DVT after neck surgery 2009. b. coumadin d/c 10-2008.   PONV (postoperative nausea and vomiting) 2009   neck surgery   PPD positive    Recurrent UTI    RSD (reflex sympathetic dystrophy)    a. Chronic pain.   Spinal stenosis    Type II diabetes mellitus ([place])    no on medication   Venous insufficiency    a. Contributing to LEE.  Patient Active Problem List   Diagnosis Date Noted   Cervical myelopathy ([place]) 11/29/2020   Chronic idiopathic constipation 11/12/2020   Lumbar compression fracture ([place]) 09/29/2020   Other spondylosis with radiculopathy, cervical region 09/29/2020   External hemorrhoids 02/15/2018   History of three vessel coronary artery bypass - 2017 , in [facility] 09/25/2017   PVD (peripheral vascular disease) ([place])-- stent L leg 06-2017 Dr Feliberto Gottron [facility] 07/23/2017   Phantom pain ([place]) 04/01/2017   Constipation, chronic 04/01/2017   S/P unilateral BKA (below knee amputation), right ([place]) 12/14/2016   Toe osteomyelitis, left ([place]) 11/17/2016   Acute on chronic diastolic CHF (congestive heart failure), NYHA class 3 ([place]) 03/30/2016   Coronary artery disease due to lipid rich plaque 03/30/2016    Acute renal failure superimposed on stage 3 chronic kidney disease ([place]) 03/30/2016   S/P below knee amputation ([place]) 02/18/2016   Memory loss 11/26/2015   PCP NOTES >>>>>>>>>>>>>>>>>>>> 10/18/2015   S/P partial colectomy 01/08/2015   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 01/28/2014   Anemia 01/18/2014   CAD (coronary artery disease)    Carcinoma in situ in a polyp    Depression 09/17/2013   Charcot's joint 08/21/2013   Foot osteomyelitis, right ([place]) 07/16/2013   Chronic pain associated with significant psychosocial dysfunction 05/06/2013   Neuropathy    Hyperthyroidism    Lower extremity edema 03/08/2011   Annual physical exam 03/08/2011   ALLERGIC RHINITIS 04/15/2010   ORTHOSTATIC DIZZINESS 04/13/2010   HIATAL HERNIA 12/06/2009   GASTRIC ULCER, HX OF 12/06/2009   DYSPNEA 03/18/2009   DEGENERATIVE JOINT DISEASE 06/03/2008   COLONIC POLYPS 01/27/2008   DIVERTICULOSIS, COLON 01/27/2008   IRRITABLE BOWEL SYNDROME, HX OF 01/27/2008   Pulmonary nodule, right 01/22/2008   Hyperlipidemia 12/30/2007   Osteoporosis 12/30/2007   TB SKIN TEST, POSITIVE 08/09/2007   DM type 2 with diabetic peripheral neuropathy ([place]) 05/09/2007   Reflex sympathetic dystrophy-- PAIN MNGMT  05/09/2007   Essential hypertension 04/15/2007   GERD 04/15/2007    Past Surgical History:  Procedure Laterality Date   AMPUTATION  03/06/2012   Procedure: AMPUTATION FOOT;  Surgeon: Newt Minion, MD;  Location: [facility];  Service: Orthopedics;  Laterality: Left;  FIFTH RAY AMPUTATION    AMPUTATION Right 02/18/2016   Procedure: AMPUTATION BELOW KNEE;  Surgeon: Newt Minion, MD;  Location: [facility];  Service: Orthopedics;  Laterality: Right;   AMPUTATION Left 11/22/2016   Procedure: Amputation 4th Toe Left Foot at Metatarsophalangeal Joint;  Surgeon: Newt Minion, MD;  Location: [facility];  Service: Orthopedics;  Laterality: Left;   ANKLE FUSION  09/27/2012   Procedure: ANKLE FUSION;  Surgeon: Newt Minion,  MD;  Location: [facility];  Service: Orthopedics;  Laterality: Left;  Left Tibiocalcaneal Fusion   ANKLE FUSION Right 05/09/2013   Procedure: ANKLE FUSION;  Surgeon: Newt Minion, MD;  Location: [facility];  Service: Orthopedics;  Laterality: Right;  Excision Osteomyelitis Base 1st MT Right Foot, Fusion Medial Column   ANTERIOR CERVICAL DECOMP/DISCECTOMY FUSION  12/18/07   For OA,  Dr. Lorin Mercy:  fu by a PE   ANTERIOR CERVICAL DECOMP/DISCECTOMY FUSION N/A 11/29/2020   Procedure: Anterior Cervical Decompression Fusion - Cervical Three-Cerivcal Four;  Surgeon: Earnie Larsson, MD;  Location: [facility];  Service: Neurosurgery;  Laterality: N/A;  3C   CARDIAC CATHETERIZATION  05/2010    at [facility] Left 09/2011   CATARACT EXTRACTION W/ INTRAOCULAR LENS  IMPLANT, BILATERAL  2004  feb 2004 left, aug 2004 right   CHOLECYSTECTOMY  03/2002   CORONARY ANGIOPLASTY  10/31/2013   CORONARY ARTERY BYPASS GRAFT  09/27/2016   CYST REMOVAL HAND  06/2003   FOOT BONE EXCISION Right 06/2009   LAPAROSCOPIC RIGHT HEMI COLECTOMY N/A 01/08/2015   Procedure: LAPAROSCOPIC ASSISTED RIGHT HEMI COLECTOMY;  Surgeon: Johnathan Hausen, MD;  Location: [facility];  Service: General;  Laterality: N/A;   LEFT HEART CATHETERIZATION WITH CORONARY ANGIOGRAM N/A 10/31/2013   Procedure: LEFT HEART CATHETERIZATION WITH CORONARY ANGIOGRAM;  Surgeon: Jettie Booze, MD;  Location[facility];  Service: Cardiovascular;  Laterality: N/A;   NASAL SEPTUM SURGERY  0000000   NISSEN FUNDOPLICATION  [id number]   PERCUTANEOUS CORONARY INTERVENTION-BALLOON ONLY  10/31/2013   Procedure: PERCUTANEOUS CORONARY INTERVENTION-BALLOON ONLY;  Surgeon: Jettie Booze, MD;  Location: [facility] C[facility] LAB;  Service: Cardiovascular;;   ROTATOR CUFF REPAIR Right 07/2007   Dr. Morene Rankins CUFF REPAIR Left 11/2004   stent in left leg, behind knee  06/27/2017   x2, done at [facility] cardiology in [place] Right 08/2008   3rd toe, Dr. Sharol Given due to  osteomyelitis   VAGINAL HYSTERECTOMY  03/1975   VEIN LIGATION Bilateral 03/1966   VENA CAVA FILTER PLACEMENT  01/2010   green filter; "due to blood clots"   WISDOM TOOTH EXTRACTION  06/2007     OB History   No obstetric history on file.     Family History  Problem Relation Age of Onset   Heart disease Mother        mitral valve replaced   Arthritis Mother    Diabetic kidney disease Daughter    Diabetes Father    Stroke Father    Migraines Sister    Hypertension Other    Breast cancer Other    Colon cancer Neg Hx    Esophageal cancer Neg Hx    Stomach cancer Neg Hx    Rectal cancer Neg Hx     Social History   Tobacco Use   Smoking status: Never   Smokeless tobacco: Never   Tobacco comments:    tried for a few months in college.   Vaping Use   Vaping Use: Never used  Substance Use Topics   Alcohol use: No    Alcohol/week: 0.0 standard drinks   Drug use: No    Home Medications Prior to Admission medications   Medication Sig Start Date End Date Taking? Authorizing Provider  Alpha-Lipoic Acid 600 MG CAPS Take 1,200 mg by mouth daily.    [provider]  amitriptyline (ELAVIL) 25 MG tablet Take 1 tablet (25 mg total) by mouth at bedtime. 01/17/21   Colon Branch, MD  Ascorbic Acid (VITAMIN C WITH ROSE HIPS) 500 MG tablet Take 500 mg by mouth in the morning, at noon, and at bedtime.    [provider]  atorvastatin (LIPITOR) 40 MG tablet Take 40 mg by mouth daily. 12/01/18   [provider]  carvedilol (COREG) 12.5 MG tablet Take 12.5 mg by mouth 2 (two) times daily with a meal.    [provider]  Cholecalciferol (VITAMIN D3) 5000 UNITS CAPS Take 5,000 Units by mouth every evening. Reported on 11/24/2015    [provider]  clopidogrel (PLAVIX) 75 MG tablet Take 1 tablet (75 mg total) by mouth at bedtime. Restart on 12/04/2020 11/30/20   Viona Gilmore D, NP  Cyanocobalamin (VITAMIN B-12) 500 MCG SUBL Place 5,000 mcg  under the tongue  daily.    [provider]  diclofenac Sodium (VOLTAREN) 1 % GEL Apply 2-4 g topically 4 (four) times daily as needed. Patient taking differently: Apply 2-4 g topically 4 (four) times daily as needed (joint pain). 03/10/20   Colon Branch, MD  dicyclomine (BENTYL) 10 MG capsule Take 1 capsule (10 mg total) by mouth 3 (three) times daily before meals. NEEDS APPT 01/14/21   Mauri Pole, MD  famotidine (PEPCID) 20 MG tablet Take 1 tablet (20 mg total) by mouth 2 (two) times daily. 11/18/20   Colon Branch, MD  ferrous sulfate (FEROSUL) 325 (65 FE) MG tablet Take 1 tablet (325 mg total) by mouth 2 (two) times daily with a meal. 12/08/20   Colon Branch, MD  furosemide (LASIX) 20 MG tablet Take 20 mg by mouth daily. 11/09/16   [provider]  Galcanezumab-gnlm (EMGALITY) 120 MG/ML SOAJ Inject 120 mg into the skin every 30 (thirty) days. 06/14/20   Melvenia Beam, MD  glucose blood (FREESTYLE LITE) test strip USE TO CHECK BLOOD SUGAR NO MORE THAN TWICE DAILY 12/04/19   Colon Branch, MD  HYDROcodone-acetaminophen [facility]) 10-325 MG tablet Take 1 tablet by mouth every 6 (six) hours as needed. 05/11/21   Colon Branch, MD  hydrocortisone (ANUSOL-HC) 2.5 % rectal cream Place 1 application rectally 2 (two) times daily. As needed 03/10/21   Mauri Pole, MD  hydrocortisone (ANUSOL-HC) 25 MG suppository Place 1 suppository (25 mg total) rectally 2 (two) times daily. As needed 03/10/21   Mauri Pole, MD  Lancets (FREESTYLE) lancets CHECK BLOOD SUGAR TWICE DAILY 04/08/19   Colon Branch, MD  loperamide (IMODIUM A-D) 2 MG tablet Take 2 mg by mouth as needed for diarrhea or loose stools.    [provider]  lubiprostone ([name]) 24 MCG capsule Take 1 capsule (24 mcg total) by mouth 2 (two) times daily with a meal. 11/26/19   Nandigam, Venia Minks, MD  MegaRed Omega-3 Krill Oil 500 MG CAPS Restart on 12/04/2020 11/30/20   Viona Gilmore D, NP  methenamine (HIPREX) 1 g tablet Take 0.5 tablets by  mouth daily. 07/30/18   [provider]  Multiple Vitamins-Minerals (PRESERVISION AREDS 2) CAPS Take 1 capsule by mouth 2 (two) times daily.    [provider]  naloxone [facility]) nasal spray 4 mg/0.1 mL Place 1 spray into the nose once as needed for up to 1 dose. Patient not taking: No sig reported 10/06/20   Colon Branch, MD  nitroGLYCERIN (NITROSTAT) 0.4 MG SL tablet Place 1 tablet (0.4 mg total) under the tongue every 5 (five) minutes x 3 doses as needed for chest pain. Patient not taking: No sig reported 12/24/19   Colon Branch, MD  omeprazole (PRILOSEC) 20 MG capsule Take 1 capsule (20 mg total) by mouth 2 (two) times daily before a meal. 01/10/21   Nandigam, Venia Minks, MD  ondansetron (ZOFRAN) 4 MG tablet Take 1 tablet (4 mg total) by mouth every 8 (eight) hours as needed for nausea or vomiting. 05/12/21   Colon Branch, MD  OVER THE COUNTER MEDICATION Take 1 capsule by mouth 2 (two) times daily as needed (digestive issues). Integrative Digestive Formula    [provider]  OVER THE COUNTER MEDICATION 1 tablet 3 (three) times daily. Berberorine Gluco Defense    [provider]  Polyvinyl Alcohol-Povidone (REFRESH OP) Place 1 drop into both eyes 3 (three) times daily as  needed (dry eyes).     [provider]  pramipexole (MIRAPEX) 0.125 MG tablet Take 1-2 tablets (0.125-0.25 mg total) by mouth at bedtime as needed. Patient taking differently: Take 0.125-0.25 mg by mouth at bedtime as needed (restless leg). 11/18/20   Colon Branch, MD  rivaroxaban (XARELTO) 2.5 MG TABS tablet Take 1 tablet (2.5 mg total) by mouth 2 (two) times daily. Restart on 12/04/2020 11/30/20   Viona Gilmore D, NP  vitamin E 180 MG (400 UNITS) capsule Take 400 Units by mouth daily.    [provider]    Allergies    Cymbalta [duloxetine hcl], Gabapentin, Silicone, Metformin and related, Adhesive [tape], Cefazolin, Ciprofloxacin, Clorazepate dipotassium, Dilaudid [hydromorphone  hcl], Doxycycline, Enablex [darifenacin hydrobromide er], Levofloxacin, Lyrica [pregabalin], Methadone hcl, Morphine and related, and Talwin [pentazocine]  Review of Systems   Review of Systems  Constitutional:  Positive for fever.  Respiratory:  Negative for shortness of breath.   Cardiovascular:  Negative for chest pain.  Gastrointestinal:  Negative for abdominal pain.  Genitourinary:  Positive for dysuria and hematuria.  Musculoskeletal:  Positive for back pain.  All other systems reviewed and are negative.  Physical Exam Updated Vital Signs BP (!) 118/99 (BP Location: Right Arm)   Pulse 61   Temp 98.5 F (36.9 C) (Oral)   Resp 19   Ht 1.626 m ('5\' 4"'$ )   Wt 77.3 kg   SpO2 99%   BMI 29.25 kg/m   Physical Exam CONSTITUTIONAL: Elderly, no acute distress HEAD: Normocephalic/atraumatic EYES: EOMI/PERRL ENMT: Mucous membranes moist NECK: supple no meningeal signs SPINE/BACK:entire spine nontender, no bruising/crepitance/stepoffs noted to spine Lumbar paraspinal tenderness noted CV: S1/S2 noted LUNGS: Lungs are clear to auscultation bilaterally, no apparent distress ABDOMEN: soft, nontender, no rebound or guarding, bowel sounds noted throughout abdomen GU:no cva tenderness NEURO: Pt is awake/alert/appropriate, moves all extremitiesx4.  No facial droop.  She moves all extremities x4 without difficult EXTREMITIES: pulses normal/equal, full ROM Brace noted to left lower extremity.  Right lower extremity prosthesis is noted Mild tenderness to palpation of right hand.  No bruising or deformities SKIN: warm, color normal PSYCH: no abnormalities of mood noted, alert and oriented to situation  ED Results / Procedures / Treatments   Labs (all labs ordered are listed, but only abnormal results are displayed) Labs Reviewed  COMPREHENSIVE METABOLIC PANEL - Abnormal; Notable for the following components:      Result Value   Chloride 113 (*)    CO2 21 (*)    Glucose, Bld 136 (*)     Creatinine, Ser 1.29 (*)    Calcium 8.3 (*)    Total Protein 5.8 (*)    AST 11 (*)    Alkaline Phosphatase 37 (*)    GFR, Estimated 41 (*)    Anion gap 4 (*)    All other components within normal limits  CBC WITH DIFFERENTIAL/PLATELET - Abnormal; Notable for the following components:   RBC 3.86 (*)    Hemoglobin 11.5 (*)    Platelets 140 (*)    All other components within normal limits  URINALYSIS, COMPLETE (UACMP) WITH MICROSCOPIC - Abnormal; Notable for the following components:   Color, Urine AMBER (*)    APPearance TURBID (*)    Hgb urine dipstick LARGE (*)    Leukocytes,Ua LARGE (*)    WBC, UA >50 (*)    Bacteria, UA MANY (*)    All other components within normal limits  URINE CULTURE    EKG None  Radiology DG Lumbar Spine Complete  Result Date: 06/02/2021 CLINICAL DATA:  Pain EXAM: LUMBAR SPINE - COMPLETE 4+ VIEW COMPARISON:  06/10/2020 FINDINGS: Mild leftward scoliosis. Diffuse degenerative disc and facet disease throughout the lumbar spine. Mild L1 compression fracture, stable since prior study. No subluxation. IMPRESSION: Stable chronic L1 compression fracture. Diffuse degenerative disc and facet disease. No acute bony abnormality. Electronically Signed   By: Rolm Baptise M.D.   On: 06/02/2021 00:34   DG Hand Complete Right  Result Date: 06/02/2021 CLINICAL DATA:  Pain EXAM: RIGHT HAND - COMPLETE 3+ VIEW COMPARISON:  None. FINDINGS: Mild degenerative changes in the IP joints. No acute bony abnormality. Specifically, no fracture, subluxation, or dislocation. IMPRESSION: No acute bony abnormality. Electronically Signed   By: Rolm Baptise M.D.   On: 06/02/2021 00:34    Procedures Procedures   Medications Ordered in ED Medications  HYDROcodone-acetaminophen (NORCO/VICODIN) 5-325 MG per tablet 2 tablet (2 tablets Oral Given 06/01/21 2352)  fosfomycin (MONUROL) packet 3 g (3 g Oral Given 06/02/21 [id number])    ED Course  I have reviewed the triage vital signs and the nursing  notes.  Pertinent labs & imaging results that were available during my care of the patient were reviewed by me and considered in my medical decision making (see chart for details).    MDM Rules/Calculators/A&P                           Patient presents with back pain concern for UTI.  She is not confused this time.  Son reports that her biggest concern is that she may have UTI or pyonephritis.  Also obtain lumbar spine x-ray since she had a recent fall.  She also endorses pain to her right hand.  No other signs of acute traumatic injury. Imaging and labs are pending at this time  Patient stable in the ER.  No acute injuries to her back.  X-ray of right hand is negative Labs do reveal UTI.  Patient does have previous history of UTIs and son reports she typically takes suppressive therapy. Patient is not septic appearing.  She is awake alert in no acute distress Will send urine culture.  One-time dose of fosfomycin has been ordered Patient and son are agreeable with plan  Final Clinical Impression(s) / ED Diagnoses Final diagnoses:  Acute cystitis with hematuria    Rx / DC Orders ED Discharge Orders     None        Ripley Fraise, MD 06/02/21 [phone]

## 2021-06-02 NOTE — ED Notes (Signed)
Spoke with lab, Urine has been received. If results do not show in epic due to downtime, lab to send form with results.

## 2021-06-03 ENCOUNTER — Other Ambulatory Visit: Payer: Self-pay | Admitting: Gastroenterology

## 2021-06-04 LAB — URINE CULTURE: Culture: >=100000 — AB

## 2021-06-06 ENCOUNTER — Telehealth: Payer: Self-pay

## 2021-06-06 NOTE — Telephone Encounter (Signed)
Post ED Visit - Positive Culture Follow-up  Culture report reviewed by antimicrobial stewardship pharmacist: [facility] Team '[]'$  Elenor Quinones, Pharm.D. '[]'$  Heide Guile, Pharm.D., BCPS AQ-ID '[]'$  Parks Neptune, Pharm.D., BCPS '[]'$  Alycia Rossetti, Pharm.D., BCPS '[]'$  [place], Pharm.D., BCPS, AAHIVP '[]'$  Legrand Como, Pharm.D., BCPS, AAHIVP '[]'$  Salome Arnt, PharmD, BCPS '[]'$  Johnnette Gourd, PharmD, BCPS '[]'$  Hughes Better, PharmD, BCPS '[]'$  Leeroy Cha, PharmD '[]'$  Laqueta Linden, PharmD, BCPS '[]'$  Albertina Parr, PharmD  [place] Team '[x]'$  Leodis Sias, PharmD '[]'$  Lindell Spar, PharmD '[]'$  Royetta Asal, PharmD '[]'$  Graylin Shiver, Rph '[]'$  Rema Fendt) Glennon Mac, PharmD '[]'$  Arlyn Dunning, PharmD '[]'$  Netta Cedars, PharmD '[]'$  Dia Sitter, PharmD '[]'$  Leone Haven, PharmD '[]'$  Gretta Arab, PharmD '[]'$  Theodis Shove, PharmD '[]'$  Peggyann Juba, PharmD '[]'$  Reuel Boom, PharmD   Positive urine culture Treated with Fosfomycin, organism sensitive to the same and no further patient follow-up is required at this time.  Glennon Hamilton 06/06/2021, 9:19 AM

## 2021-06-08 ENCOUNTER — Other Ambulatory Visit: Payer: Self-pay

## 2021-06-08 ENCOUNTER — Encounter: Payer: Self-pay | Admitting: Internal Medicine

## 2021-06-08 ENCOUNTER — Ambulatory Visit (INDEPENDENT_AMBULATORY_CARE_PROVIDER_SITE_OTHER): Payer: Medicare Other | Admitting: Internal Medicine

## 2021-06-08 VITALS — BP 132/68 | HR 92 | Temp 98.2°F | Resp 18 | Ht 64.0 in | Wt 170.5 lb

## 2021-06-08 DIAGNOSIS — L03119 Cellulitis of unspecified part of limb: Secondary | ICD-10-CM

## 2021-06-08 DIAGNOSIS — N3000 Acute cystitis without hematuria: Secondary | ICD-10-CM | POA: Diagnosis not present

## 2021-06-08 DIAGNOSIS — K921 Melena: Secondary | ICD-10-CM | POA: Diagnosis not present

## 2021-06-08 DIAGNOSIS — I10 Essential (primary) hypertension: Secondary | ICD-10-CM

## 2021-06-08 MED ORDER — AMOXICILLIN-POT CLAVULANATE 875-125 MG PO TABS: 1.0000 | ORAL_TABLET | Freq: Two times a day (BID) | ORAL | 0 refills | Status: DC

## 2021-06-08 MED ORDER — HYDROCORTISONE 2.5 % EX CREA: TOPICAL_CREAM | Freq: Two times a day (BID) | CUTANEOUS | 0 refills | Status: DC

## 2021-06-08 NOTE — Progress Notes (Signed)
Subjective:    Patient ID: [name], female    DOB: 09-08-1935, 86 y.o.   MRN: [id number]  DOS:  06/08/2021 Type of visit - description: ER follow-up  Went to the ER 06/01/2021, She presented with dysuria and low-grade fever. She also have back pain (chronic).   Work-up included: Creatinine 1.2 slightly elevated, urinalysis with + WBCs, bacteria. Culture: E. coli, multi sensitive. White count was not elevated, hemoglobin is slightly lower than baseline at 11.5.  Platelets 140, slightly low. Lumbar x-ray nonacute, hand x-ray nonacute Was treated with fosfomycin x1.   She is here with her son, ER follow-up. At this point continue with back pain. Dysuria seems to be better Never had gross hematuria  She also complain of "black stools", this is ongoing for long time, from time to time sees small spots of red blood in the stools. Recent CBC normal.  Also, for some reason she has been sleeping with her R leg prosthesis on, has developed a rash.     Review of Systems See above   Past Medical History:  Diagnosis Date   Allergic rhinitis    Arthritis    "back; ankles; hands; knees" (10/31/2013)   Bilateral sensorineural hearing loss    CAD (coronary artery disease)    a. Nonobst in 2011. b. Abnormal nuc 09/2013 -> s/p cutting balloon to D2, mild LAD disease; c. 08/2015 MV: no ischemia, EF 71%.   Carcinoma in situ in a polyp 1994   a. 1994 - malignant polyp removed during colonoscopy.   Chronic diastolic CHF (congestive heart failure) ([place])    a. 09/2013 Echo: EF 60-65%, mild LVH, Gr1 DD.   Dementia ([place])    Diverticulosis    DJD (degenerative joint disease)    Fatty liver    GERD (gastroesophageal reflux disease)    a. Hx GERD/esophageal dysmotility followed by Dr. Olevia Perches.    Hemorrhoids    Hiatal hernia    a. s/p Nissen fundoplication 4196.   Hyperlipidemia    a. patient unwilling to use statins.   Hypertensive heart disease    IBS (irritable bowel syndrome)     Macular degeneration 03/2009   Dr. Rosana Hoes   Neuropathy    a. Hands, feet, legs.   Orthostatic hypotension    Osteomyelitis (H[facility])    a. 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 01/2016 s/p RLE transtibial amputation 2/2 Charcot rocker-bottom deformity and insensate neuropathy ulceration.   Osteoporosis    PE (pulmonary embolism) 12/2007   a. PE/DVT after neck surgery 2009. b. coumadin d/c 10-2008.   PONV (postoperative nausea and vomiting) 2009   neck surgery   PPD positive    Recurrent UTI    RSD (reflex sympathetic dystrophy)    a. Chronic pain.   Spinal stenosis    Type II diabetes mellitus ([place])    no on medication   Venous insufficiency    a. Contributing to LEE.    Past Surgical History:  Procedure Laterality Date   AMPUTATION  03/06/2012   Procedure: AMPUTATION FOOT;  Surgeon: Newt Minion, MD;  Location: [facility];  Service: Orthopedics;  Laterality: Left;  FIFTH RAY AMPUTATION    AMPUTATION Right 02/18/2016   Procedure: AMPUTATION BELOW KNEE;  Surgeon: Newt Minion, MD;  Location: [facility];  Service: Orthopedics;  Laterality: Right;   AMPUTATION Left 11/22/2016   Procedure: Amputation 4th Toe Left Foot at Metatarsophalangeal Joint;  Surgeon: Meridee Score  V, MD;  Location: [facility];  Service: Orthopedics;  Laterality: Left;   ANKLE FUSION  09/27/2012   Procedure: ANKLE FUSION;  Surgeon: Newt Minion, MD;  Location: [facility];  Service: Orthopedics;  Laterality: Left;  Left Tibiocalcaneal Fusion   ANKLE FUSION Right 05/09/2013   Procedure: ANKLE FUSION;  Surgeon: Newt Minion, MD;  Location: [facility];  Service: Orthopedics;  Laterality: Right;  Excision Osteomyelitis Base 1st MT Right Foot, Fusion Medial Column   ANTERIOR CERVICAL DECOMP/DISCECTOMY FUSION  12/18/07   For OA,  Dr. Lorin Mercy:  fu by a PE   ANTERIOR CERVICAL DECOMP/DISCECTOMY FUSION N/A 11/29/2020   Procedure: Anterior Cervical Decompression Fusion - Cervical Three-Cerivcal Four;   Surgeon: Earnie Larsson, MD;  Location: [facility];  Service: Neurosurgery;  Laterality: N/A;  3C   CARDIAC CATHETERIZATION  05/2010    at [facility] Left 09/2011   CATARACT EXTRACTION W/ INTRAOCULAR LENS  IMPLANT, BILATERAL  2004   feb 2004 left, aug 2004 right   CHOLECYSTECTOMY  03/2002   CORONARY ANGIOPLASTY  10/31/2013   CORONARY ARTERY BYPASS GRAFT  09/27/2016   CYST REMOVAL HAND  06/2003   FOOT BONE EXCISION Right 06/2009   LAPAROSCOPIC RIGHT HEMI COLECTOMY N/A 01/08/2015   Procedure: LAPAROSCOPIC ASSISTED RIGHT HEMI COLECTOMY;  Surgeon: Johnathan Hausen, MD;  Location: [facility];  Service: General;  Laterality: N/A;   LEFT HEART CATHETERIZATION WITH CORONARY ANGIOGRAM N/A 10/31/2013   Procedure: LEFT HEART CATHETERIZATION WITH CORONARY ANGIOGRAM;  Surgeon: Jettie Booze, MD;  Location: [facility] CATH LAB;  Service: Cardiovascular;  Laterality: N/A;   NASAL SEPTUM SURGERY  [id number]/7858   NISSEN FUNDOPLICATION  05/27/276   PERCUTANEOUS CORONARY INTERVENTION-BALLOON ONLY  10/31/2013   Procedure: PERCUTANEOUS CORONARY INTERVENTION-BALLOON ONLY;  Surgeon: Jettie Booze, MD;  Location: [facility] C[facility] LAB;  Service: Cardiovascular;;   ROTATOR CUFF REPAIR Right 07/2007   Dr. Morene Rankins CUFF REPAIR Left 11/2004   stent in left leg, behind knee  06/27/2017   x2, done at [facility] cardiology in [place] Right 08/2008   3rd toe, Dr. Sharol Given due to osteomyelitis   VAGINAL HYSTERECTOMY  03/1975   VEIN LIGATION Bilateral 03/1966   VENA CAVA FILTER PLACEMENT  01/2010   green filter; "due to blood clots"   WISDOM TOOTH EXTRACTION  06/2007    Allergies as of 06/08/2021       Reactions   Cymbalta [duloxetine Hcl] Swelling   Swelling in legs   Gabapentin Swelling   Swelling in legs Swelling in legs   Silicone Hives, Itching, Dermatitis, Rash   Metformin And Related Other (See Comments)   dizzy, tired, chills, diarrhea, and nausea   Adhesive [tape] Rash   rash rash    Cefazolin Hives   Hives Hives   Ciprofloxacin Other (See Comments)   Other reaction(s): Other (See Comments) Per pt, caused body aches Per pt, caused body aches   Clorazepate Dipotassium Other (See Comments)   Unknown reaction   Dilaudid [hydromorphone Hcl] Other (See Comments)    confused, intense itching   Doxycycline Rash   Enablex [darifenacin Hydrobromide Er] Other (See Comments)   Hypotension, near syncope   Levofloxacin Other (See Comments)   Causes wrist pain   Lyrica [pregabalin] Swelling   Swelling in legs   Methadone Hcl Other (See Comments)   Reaction unknown   Morphine And Related Other (See Comments)   Confusion, constipation.   Talwin [pentazocine]  Other (See Comments)   "climbing walls" anxiety        Medication List        Accurate as of June 08, 2021 11:59 PM. If you have any questions, ask your nurse or doctor.          Alpha-Lipoic Acid 600 MG Caps Take 1,200 mg by mouth daily.   amitriptyline 25 MG tablet Commonly known as: ELAVIL Take 1 tablet (25 mg total) by mouth at bedtime.   amoxicillin-clavulanate 875-125 MG tablet Commonly known as: Augmentin Take 1 tablet by mouth 2 (two) times daily. Started by: [name], MD   atorvastatin 40 MG tablet Commonly known as: LIPITOR Take 40 mg by mouth daily.   carvedilol 12.5 MG tablet Commonly known as: COREG Take 12.5 mg by mouth 2 (two) times daily with a meal.   clopidogrel 75 MG tablet Commonly known as: PLAVIX Take 1 tablet (75 mg total) by mouth at bedtime. Restart on 12/04/2020   diclofenac Sodium 1 % Gel Commonly known as: VOLTAREN Apply 2-4 g topically 4 (four) times daily as needed. What changed: reasons to take this   dicyclomine 10 MG capsule Commonly known as: BENTYL TAKE 1 CAPSULE THREE TIMES A DAY BEFORE MEALS (NEED APPOINTMENT)   Emgality 120 MG/ML Soaj Generic drug: Galcanezumab-gnlm Inject 120 mg into the skin every 30 (thirty) days.   famotidine 20 MG  tablet Commonly known as: PEPCID Take 1 tablet (20 mg total) by mouth 2 (two) times daily.   ferrous sulfate 325 (65 FE) MG tablet Commonly known as: FeroSul Take 1 tablet (325 mg total) by mouth 2 (two) times daily with a meal.   freestyle lancets CHECK BLOOD SUGAR TWICE DAILY   FREESTYLE LITE test strip Generic drug: glucose blood USE TO CHECK BLOOD SUGAR NO MORE THAN TWICE DAILY   furosemide 20 MG tablet Commonly known as: LASIX Take 20 mg by mouth daily.   gabapentin 100 MG capsule Commonly known as: NEURONTIN Take 100 mg by mouth 2 (two) times daily.   HYDROcodone-acetaminophen 10-325 MG tablet Commonly known as: NORCO Take 1 tablet by mouth every 6 (six) hours as needed.   hydrocortisone 2.5 % cream Apply topically 2 (two) times daily. Started by: [name], MD   hydrocortisone 2.5 % rectal cream Commonly known as: ANUSOL-HC Place 1 application rectally 2 (two) times daily. As needed   hydrocortisone 25 MG suppository Commonly known as: ANUSOL-HC Place 1 suppository (25 mg total) rectally 2 (two) times daily. As needed   loperamide 2 MG tablet Commonly known as: IMODIUM A-D Take 2 mg by mouth as needed for diarrhea or loose stools.   lubiprostone 24 MCG capsule Commonly known as: [name] Take 1 capsule (24 mcg total) by mouth 2 (two) times daily with a meal.   MegaRed Omega-3 Krill Oil 500 MG Caps Restart on 12/04/2020   methenamine 1 g tablet Commonly known as: HIPREX Take 0.5 tablets by mouth daily.   naloxone 4 MG/0.1ML Liqd nasal spray kit Commonly known as: NARCAN Place 1 spray into the nose once as needed for up to 1 dose.   nitroGLYCERIN 0.4 MG SL tablet Commonly known as: NITROSTAT Place 1 tablet (0.4 mg total) under the tongue every 5 (five) minutes x 3 doses as needed for chest pain.   omeprazole 20 MG capsule Commonly known as: PRILOSEC Take 1 capsule (20 mg total) by mouth 2 (two) times daily before a meal.   ondansetron 4 MG  tablet Commonly known  as: Zofran Take 1 tablet (4 mg total) by mouth every 8 (eight) hours as needed for nausea or vomiting.   OVER THE COUNTER MEDICATION 1 tablet 3 (three) times daily. Berberorine Gluco Defense   OVER THE COUNTER MEDICATION Take 1 capsule by mouth 2 (two) times daily as needed (digestive issues). Integrative Digestive Formula   pramipexole 0.125 MG tablet Commonly known as: MIRAPEX Take 1-2 tablets (0.125-0.25 mg total) by mouth at bedtime as needed. What changed: reasons to take this   PreserVision AREDS 2 Caps Take 1 capsule by mouth 2 (two) times daily.   REFRESH OP Place 1 drop into both eyes 3 (three) times daily as needed (dry eyes).   rivaroxaban 2.5 MG Tabs tablet Commonly known as: XARELTO Take 1 tablet (2.5 mg total) by mouth 2 (two) times daily. Restart on 12/04/2020   Vitamin B-12 500 MCG Subl Place 5,000 mcg under the tongue daily.   vitamin C with rose hips 500 MG tablet Take 500 mg by mouth in the morning, at noon, and at bedtime.   Vitamin D3 125 MCG (5000 UT) Caps Take 5,000 Units by mouth every evening. Reported on 11/24/2015   vitamin E 180 MG (400 UNITS) capsule Take 400 Units by mouth daily.           Objective:   Physical Exam BP 132/68 (BP Location: Left Arm, Patient Position: Sitting, Cuff Size: Small)   Pulse 92   Temp 98.2 F (36.8 C) (Oral)   Resp 18   Ht 5' 4"  (1.626 m)   Wt 170 lb 8 oz (77.3 kg)   SpO2 96%   BMI 29.27 kg/m  General:   Well developed, NAD, BMI noted. HEENT:  Normocephalic . Face symmetric, atraumatic Abdomen: Not distended, soft, mild tenderness at the lower abdomen bilaterally without mass or rebound. Skin: Has a rash on the inner aspect of the right leg, see picture.  Rash is a slightly warm, not tender, no obvious abscess. Neurologic:  alert & oriented X3.  Speech normal, gait: Not tested  psych--  Cognition and judgment appear intact.  Cooperative with normal attention span and  concentration.  Behavior appropriate. No anxious or depressed appearing.       Assessment      ASSESMENT DM, Neuropathy, amputations due to Charcot feet. - metformin intolerant  (lethargic) HTN  Hyperlipidemia LUNG MASS -incidental per CT 2009,  PET scan 01-2008: likely  benign, CT 08-2009 no change, no further CTs per Dr Gwenette Greet - Had a CT in [place] 09-2016 "spiculated mass", saw Dr Lamonte Sakai,  CT  09/2017 stable CV: ---CAD sees Dr Meda Coffee and a cardiologist in [facility] --NSTEMI 09-2016. Outside hospital: Cath 2 vessel disease, CABG 2, LIMA to LAD and Diag   ---Chronic diastolic CHF ---PVD: Aortogram and a stent L Leg Dr Feliberto Gottron, 06-27-2017 @ [place] (per pt) GI: GERD, IBS, colon polyps, PUD, abdominal pain "post polypectomy syndrome" naueas meds prn (antivert-zofran, gets nausea when car traveling) Osteoporosis  MSK,pain mngmt : --Reflex sympathetic dystrophy --Neuropathy --DJD --Spinal stenosis  --H/o Osteomyelitis  Foot --s/p amputation:  4th 5th L toes , R BKA  (01-2016, dx osteomyelitis) w/ phantom pain  -- sees Dr Sharol Given prn She had local injections with Dr. Maia Petties 2016 w/o apparent help. She took oxycodone after surgery and that did NOT seem to help better than hydrocodone. Intolerant to Cymbalta, Neurontin ; Lyrica helped but caused edema. Recurrent UTIs: Dr Junious Silk  Venous insufficiency  H/o  + PPD H/o hypothyroidism: TSHs  stable H/o dyspnea   PLAN:  UTI: Went to the ER with dysuria and low-grade temp, urinalysis consistent with a UTI, was treated with fosfomycin.  They are somewhat concerned about possible persistent of the UTI: "It always take a lot of antibiotics to clear that up".  Plan: See comments under cellulitis. Early cellulitis?  Has redness and warmness at the right leg stump, see picture.  Although this could be simply irritation from sleeping with the prosthesis at night, this also could be early cellulitis, I am electing to prescribe Augmentin.  Also  hydrocortisone and keep the area dry. Augmentin should help treat any possible remanent of a UTI. "Black stools": A chronic issue, no nausea or vomiting, last CBC with no major drop in hemoglobin.  Observation. HTN: BP is controlled, continue carvedilol, Lasix, last creatinine is slightly up, check BMP.  Time spent 30 minutes, will review the ER visit and labs together, address a new issue (cellulitis), with talk about the UTI and the fact that I believe he was treated with a single dose of antibiotics, they think she needed more.   This visit occurred during the SARS-CoV-2 public health emergency.  Safety protocols were in place, including screening questions prior to the visit, additional usage of staff PPE, and extensive cleaning of exam room while observing appropriate contact time as indicated for disinfecting solutions.

## 2021-06-08 NOTE — Patient Instructions (Signed)
Apply the cream to the area of redness on your right leg at bedtime.  Remove your prosthesis at nighttime.  If the rash gets worse let us know  Take antibiotic x7 days   Please get your blood work  Drink plenty of fluids

## 2021-06-09 LAB — BASIC METABOLIC PANEL
BUN: 17 mg/dL (ref 6–23)
CO2: 21 mEq/L (ref 19–32)
Calcium: 8.8 mg/dL (ref 8.4–10.5)
Chloride: 109 mEq/L (ref 96–112)
Creatinine, Ser: 1.17 mg/dL (ref 0.40–1.20)
GFR: 43.01 mL/min — ABNORMAL LOW (ref >60.00)
Glucose, Bld: 107 mg/dL — ABNORMAL HIGH (ref 70–99)
Potassium: 4.6 mEq/L (ref 3.5–5.1)
Sodium: 139 mEq/L (ref 135–145)

## 2021-06-09 NOTE — Assessment & Plan Note (Signed)
UTI: Went to the ER with dysuria and low-grade temp, urinalysis consistent with a UTI, was treated with fosfomycin.  They are somewhat concerned about possible persistent of the UTI: "It always take a lot of antibiotics to clear that up".  Plan: See comments under cellulitis. Early cellulitis?  Has redness and warmness at the right leg stump, see picture.  Although this could be simply irritation from sleeping with the prosthesis at night, this also could be early cellulitis, I am electing to prescribe Augmentin.  Also hydrocortisone and keep the area dry. Augmentin should help treat any possible remanent of a UTI. "Black stools": A chronic issue, no nausea or vomiting, last CBC with no major drop in hemoglobin.  Observation. HTN: BP is controlled, continue carvedilol, Lasix, last creatinine is slightly up, check BMP.

## 2021-06-12 ENCOUNTER — Other Ambulatory Visit: Payer: Self-pay | Admitting: Internal Medicine

## 2021-06-16 DIAGNOSIS — N3020 Other chronic cystitis without hematuria: Secondary | ICD-10-CM | POA: Diagnosis not present

## 2021-06-18 ENCOUNTER — Other Ambulatory Visit: Payer: Self-pay | Admitting: Internal Medicine

## 2021-06-23 ENCOUNTER — Other Ambulatory Visit: Payer: Self-pay

## 2021-06-23 ENCOUNTER — Other Ambulatory Visit: Payer: Self-pay | Admitting: *Deleted

## 2021-06-23 MED ORDER — HYDROCORTISONE 2.5 % EX CREA: TOPICAL_CREAM | Freq: Two times a day (BID) | CUTANEOUS | 1 refills | Status: DC

## 2021-07-06 DIAGNOSIS — I70213 Atherosclerosis of native arteries of extremities with intermittent claudication, bilateral legs: Secondary | ICD-10-CM | POA: Diagnosis not present

## 2021-07-15 DIAGNOSIS — I872 Venous insufficiency (chronic) (peripheral): Secondary | ICD-10-CM | POA: Diagnosis not present

## 2021-07-15 DIAGNOSIS — I700 Atherosclerosis of aorta: Secondary | ICD-10-CM | POA: Diagnosis not present

## 2021-07-15 DIAGNOSIS — I70213 Atherosclerosis of native arteries of extremities with intermittent claudication, bilateral legs: Secondary | ICD-10-CM | POA: Diagnosis not present

## 2021-07-15 DIAGNOSIS — I10 Essential (primary) hypertension: Secondary | ICD-10-CM | POA: Diagnosis not present

## 2021-07-18 ENCOUNTER — Other Ambulatory Visit: Payer: Self-pay | Admitting: Internal Medicine

## 2021-07-18 DIAGNOSIS — G43009 Migraine without aura, not intractable, without status migrainosus: Secondary | ICD-10-CM

## 2021-07-18 MED ORDER — ONDANSETRON HCL 4 MG PO TABS: ORAL_TABLET | ORAL | 0 refills | Status: DC

## 2021-07-18 MED ORDER — HYDROCODONE-ACETAMINOPHEN 10-325 MG PO TABS: 1.0000 | ORAL_TABLET | Freq: Four times a day (QID) | ORAL | 0 refills | Status: DC | PRN

## 2021-07-18 NOTE — Telephone Encounter (Signed)
Requesting: hydrocodone 10-325mg   Contract: 09/22/2020 UDS: 06/11/2020 Last Visit: 06/08/2021 Next Visit: 07/27/2021 Last Refill: 05/11/2021 #120 and 0RF  Please Advise

## 2021-07-18 NOTE — Telephone Encounter (Signed)
Medication:  HYDROcodone-acetaminophen (NORCO) 10-325 MG tablet   ondansetron (ZOFRAN) 4 MG tablet .  Pt wants to know can she receive a 90 day supply of ondansetron  Has the patient contacted their pharmacy? No. (If no, request that the patient contact the pharmacy for the refill.) (If yes, when and what did the pharmacy advise?)  Preferred Pharmacy (with phone number or street name): [facility], [address] AT [place]  [address] [place] [address]  Phone:  [phone]    Agent: Please be advised that RX refills may take up to 3 business days. We ask that you follow-up with your pharmacy.

## 2021-07-19 ENCOUNTER — Ambulatory Visit: Payer: Medicare Other | Admitting: Internal Medicine

## 2021-07-20 DIAGNOSIS — S32040A Wedge compression fracture of fourth lumbar vertebra, initial encounter for closed fracture: Secondary | ICD-10-CM | POA: Diagnosis not present

## 2021-07-20 DIAGNOSIS — M4802 Spinal stenosis, cervical region: Secondary | ICD-10-CM | POA: Diagnosis not present

## 2021-07-27 ENCOUNTER — Ambulatory Visit (INDEPENDENT_AMBULATORY_CARE_PROVIDER_SITE_OTHER): Payer: Medicare Other | Admitting: Internal Medicine

## 2021-07-27 ENCOUNTER — Other Ambulatory Visit: Payer: Self-pay

## 2021-07-27 ENCOUNTER — Encounter: Payer: Self-pay | Admitting: Internal Medicine

## 2021-07-27 VITALS — BP 116/72 | HR 84 | Temp 98.1°F | Resp 18 | Ht 64.0 in | Wt 161.0 lb

## 2021-07-27 DIAGNOSIS — I10 Essential (primary) hypertension: Secondary | ICD-10-CM | POA: Diagnosis not present

## 2021-07-27 DIAGNOSIS — Z23 Encounter for immunization: Secondary | ICD-10-CM

## 2021-07-27 DIAGNOSIS — F0390 Unspecified dementia without behavioral disturbance: Secondary | ICD-10-CM | POA: Diagnosis not present

## 2021-07-27 MED ORDER — DONEPEZIL HCL 5 MG PO TABS: 5.0000 mg | ORAL_TABLET | Freq: Every day | ORAL | 1 refills | Status: DC

## 2021-07-27 NOTE — Progress Notes (Signed)
Subjective:    Patient ID: [name], female    DOB: 1935/07/20, 86 y.o.   MRN: [id number]  DOS:  07/27/2021 Type of visit - description: f/u  Since the last office visit had no major medical events. Before I stepped in the room the patient's son asked me to discuss the patient's memory. For the last year or so he has noted that the patient is very forgetful, at times confused (lost sister last year and sometimes patient says she is still alive) Have a very hard time explaining her thoughts or grasp ideas. No wandering.  The patient herself reports she is doing okay except for ongoing black stools and occasional diarrhea.   Review of Systems See above   Past Medical History:  Diagnosis Date   Allergic rhinitis    Arthritis    "back; ankles; hands; knees" (10/31/2013)   Bilateral sensorineural hearing loss    CAD (coronary artery disease)    a. Nonobst in 2011. b. Abnormal nuc 09/2013 -> s/p cutting balloon to D2, mild LAD disease; c. 08/2015 MV: no ischemia, EF 71%.   Carcinoma in situ in a polyp 1994   a. 1994 - malignant polyp removed during colonoscopy.   Chronic diastolic CHF (congestive heart failure) ([place])    a. 09/2013 Echo: EF 60-65%, mild LVH, Gr1 DD.   Dementia ([place])    Diverticulosis    DJD (degenerative joint disease)    Fatty liver    GERD (gastroesophageal reflux disease)    a. Hx GERD/esophageal dysmotility followed by Dr. Olevia Perches.    Hemorrhoids    Hiatal hernia    a. s/p Nissen fundoplication 7829.   Hyperlipidemia    a. patient unwilling to use statins.   Hypertensive heart disease    IBS (irritable bowel syndrome)    Macular degeneration 03/2009   Dr. Rosana Hoes   Neuropathy    a. Hands, feet, legs.   Orthostatic hypotension    Osteomyelitis (HCC)    a. 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 01/2016 s/p RLE transtibial amputation 2/2 Charcot rocker-bottom deformity and insensate neuropathy  ulceration.   Osteoporosis    PE (pulmonary embolism) 12/2007   a. PE/DVT after neck surgery 2009. b. coumadin d/c 10-2008.   PONV (postoperative nausea and vomiting) 2009   neck surgery   PPD positive    Recurrent UTI    RSD (reflex sympathetic dystrophy)    a. Chronic pain.   Spinal stenosis    Type II diabetes mellitus ([place])    no on medication   Venous insufficiency    a. Contributing to LEE.    Past Surgical History:  Procedure Laterality Date   AMPUTATION  03/06/2012   Procedure: AMPUTATION FOOT;  Surgeon: Newt Minion, MD;  Location: [facility];  Service: Orthopedics;  Laterality: Left;  FIFTH RAY AMPUTATION    AMPUTATION Right 02/18/2016   Procedure: AMPUTATION BELOW KNEE;  Surgeon: Newt Minion, MD;  Location: [facility];  Service: Orthopedics;  Laterality: Right;   AMPUTATION Left 11/22/2016   Procedure: Amputation 4th Toe Left Foot at Metatarsophalangeal Joint;  Surgeon: Newt Minion, MD;  Location: [facility];  Service: Orthopedics;  Laterality: Left;   ANKLE FUSION  09/27/2012   Procedure: ANKLE FUSION;  Surgeon: Newt Minion, MD;  Location: [facility];  Service: Orthopedics;  Laterality: Left;  Left Tibiocalcaneal Fusion   ANKLE FUSION Right 05/09/2013   Procedure: ANKLE FUSION;  Surgeon:  Newt Minion, MD;  Location: [facility];  Service: Orthopedics;  Laterality: Right;  Excision Osteomyelitis Base 1st MT Right Foot, Fusion Medial Column   ANTERIOR CERVICAL DECOMP/DISCECTOMY FUSION  12/18/07   For OA,  Dr. Lorin Mercy:  fu by a PE   ANTERIOR CERVICAL DECOMP/DISCECTOMY FUSION N/A 11/29/2020   Procedure: Anterior Cervical Decompression Fusion - Cervical Three-Cerivcal Four;  Surgeon: Earnie Larsson, MD;  Location: [facility];  Service: Neurosurgery;  Laterality: N/A;  3C   CARDIAC CATHETERIZATION  05/2010    at [facility] Left 09/2011   CATARACT EXTRACTION W/ INTRAOCULAR LENS  IMPLANT, BILATERAL  2004   feb 2004 left, aug 2004 right   CHOLECYSTECTOMY  03/2002   CORONARY ANGIOPLASTY   10/31/2013   CORONARY ARTERY BYPASS GRAFT  09/27/2016   CYST REMOVAL HAND  06/2003   FOOT BONE EXCISION Right 06/2009   LAPAROSCOPIC RIGHT HEMI COLECTOMY N/A 01/08/2015   Procedure: LAPAROSCOPIC ASSISTED RIGHT HEMI COLECTOMY;  Surgeon: Johnathan Hausen, MD;  Location: [facility];  Service: General;  Laterality: N/A;   LEFT HEART CATHETERIZATION WITH CORONARY ANGIOGRAM N/A 10/31/2013   Procedure: LEFT HEART CATHETERIZATION WITH CORONARY ANGIOGRAM;  Surgeon: Jettie Booze, MD;  Location[facility] CATH LAB;  Service: Cardiovascular;  Laterality: N/A;   NASAL SEPTUM SURGERY  50/0370   NISSEN FUNDOPLICATION  01/28/8890   PERCUTANEOUS CORONARY INTERVENTION-BALLOON ONLY  10/31/2013   Procedure: PERCUTANEOUS CORONARY INTERVENTION-BALLOON ONLY;  Surgeon: Jettie Booze, MD;  Location: [facility] CATH LAB;  Service: Cardiovascular;;   ROTATOR CUFF REPAIR Right 07/2007   Dr. Morene Rankins CUFF REPAIR Left 11/2004   stent in left leg, behind knee  06/27/2017   x2, done at [facility] cardiology in [place] Right 08/2008   3rd toe, Dr. Sharol Given due to osteomyelitis   VAGINAL HYSTERECTOMY  03/1975   VEIN LIGATION Bilateral 03/1966   VENA CAVA FILTER PLACEMENT  01/2010   green filter; "due to blood clots"   WISDOM TOOTH EXTRACTION  06/2007    Allergies as of 07/27/2021       Reactions   Cymbalta [duloxetine Hcl] Swelling   Swelling in legs   Gabapentin Swelling   Swelling in legs Swelling in legs   Silicone Hives, Itching, Dermatitis, Rash   Metformin And Related Other (See Comments)   dizzy, tired, chills, diarrhea, and nausea   Adhesive [tape] Rash   rash rash   Cefazolin Hives   Hives Hives   Ciprofloxacin Other (See Comments)   Other reaction(s): Other (See Comments) Per pt, caused body aches Per pt, caused body aches   Clorazepate Dipotassium Other (See Comments)   Unknown reaction   Dilaudid [hydromorphone Hcl] Other (See Comments)    confused, intense itching   Doxycycline Rash    Enablex [darifenacin Hydrobromide Er] Other (See Comments)   Hypotension, near syncope   Levofloxacin Other (See Comments)   Causes wrist pain   Lyrica [pregabalin] Swelling   Swelling in legs   Methadone Hcl Other (See Comments)   Reaction unknown   Morphine And Related Other (See Comments)   Confusion, constipation.   Talwin [pentazocine] Other (See Comments)   "climbing walls" anxiety        Medication List        Accurate as of July 27, 2021 11:59 PM. If you have any questions, ask your nurse or doctor.          STOP taking these medications  amoxicillin-clavulanate 875-125 MG tablet Commonly known as: Augmentin Stopped by: [name], MD   gabapentin 100 MG capsule Commonly known as: NEURONTIN Stopped by: [name], MD       TAKE these medications    Alpha-Lipoic Acid 600 MG Caps Take 1,200 mg by mouth daily.   amitriptyline 25 MG tablet Commonly known as: ELAVIL TAKE 1 TABLET(25 MG) BY MOUTH AT BEDTIME   atorvastatin 40 MG tablet Commonly known as: LIPITOR Take 40 mg by mouth daily.   carvedilol 12.5 MG tablet Commonly known as: COREG Take 12.5 mg by mouth 2 (two) times daily with a meal.   clopidogrel 75 MG tablet Commonly known as: PLAVIX Take 1 tablet (75 mg total) by mouth at bedtime. Restart on 12/04/2020   diclofenac Sodium 1 % Gel Commonly known as: VOLTAREN Apply 2-4 g topically 4 (four) times daily as needed. What changed: reasons to take this   dicyclomine 10 MG capsule Commonly known as: BENTYL TAKE 1 CAPSULE THREE TIMES A DAY BEFORE MEALS (NEED APPOINTMENT)   donepezil 5 MG tablet Commonly known as: ARICEPT Take 1 tablet (5 mg total) by mouth at bedtime. Started by: [name], MD   Emgality 120 MG/ML Soaj Generic drug: Galcanezumab-gnlm Inject 120 mg into the skin every 30 (thirty) days.   famotidine 20 MG tablet Commonly known as: PEPCID Take 1 tablet (20 mg total) by mouth 2 (two) times daily.   ferrous sulfate 325  (65 FE) MG tablet Commonly known as: FeroSul Take 1 tablet (325 mg total) by mouth 2 (two) times daily with a meal.   freestyle lancets CHECK BLOOD SUGAR TWICE DAILY   FREESTYLE LITE test strip Generic drug: glucose blood USE TO CHECK BLOOD SUGAR NO MORE THAN TWICE DAILY   furosemide 20 MG tablet Commonly known as: LASIX Take 20 mg by mouth daily.   HYDROcodone-acetaminophen 10-325 MG tablet Commonly known as: NORCO Take 1 tablet by mouth every 6 (six) hours as needed.   hydrocortisone 2.5 % cream Apply topically 2 (two) times daily.   hydrocortisone 2.5 % rectal cream Commonly known as: ANUSOL-HC Place 1 application rectally 2 (two) times daily. As needed   hydrocortisone 25 MG suppository Commonly known as: ANUSOL-HC Place 1 suppository (25 mg total) rectally 2 (two) times daily. As needed   loperamide 2 MG tablet Commonly known as: IMODIUM A-D Take 2 mg by mouth as needed for diarrhea or loose stools.   lubiprostone 24 MCG capsule Commonly known as: [name] Take 1 capsule (24 mcg total) by mouth 2 (two) times daily with a meal.   MegaRed Omega-3 Krill Oil 500 MG Caps Restart on 12/04/2020   methenamine 1 g tablet Commonly known as: HIPREX Take 0.5 tablets by mouth daily.   naloxone 4 MG/0.1ML Liqd nasal spray kit Commonly known as: NARCAN Place 1 spray into the nose once as needed for up to 1 dose.   nitroGLYCERIN 0.4 MG SL tablet Commonly known as: NITROSTAT Place 1 tablet (0.4 mg total) under the tongue every 5 (five) minutes x 3 doses as needed for chest pain.   omeprazole 20 MG capsule Commonly known as: PRILOSEC Take 1 capsule (20 mg total) by mouth 2 (two) times daily before a meal.   ondansetron 4 MG tablet Commonly known as: ZOFRAN TAKE 1 TABLET(4 MG) BY MOUTH EVERY 8 HOURS AS NEEDED FOR NAUSEA OR VOMITING   OVER THE COUNTER MEDICATION 1 tablet 3 (three) times daily. Berberorine Gluco Defense   OVER THE COUNTER  MEDICATION Take 1 capsule by  mouth 2 (two) times daily as needed (digestive issues). Integrative Digestive Formula   pramipexole 0.125 MG tablet Commonly known as: MIRAPEX TAKE 1 TO 2 TABLETS(0.125 TO 0.25 MG) BY MOUTH AT BEDTIME AS NEEDED   PreserVision AREDS 2 Caps Take 1 capsule by mouth 2 (two) times daily.   REFRESH OP Place 1 drop into both eyes 3 (three) times daily as needed (dry eyes).   rivaroxaban 2.5 MG Tabs tablet Commonly known as: XARELTO Take 1 tablet (2.5 mg total) by mouth 2 (two) times daily. Restart on 12/04/2020   Vitamin B-12 500 MCG Subl Place 5,000 mcg under the tongue daily.   vitamin C with rose hips 500 MG tablet Take 500 mg by mouth in the morning, at noon, and at bedtime.   Vitamin D3 125 MCG (5000 UT) Caps Take 5,000 Units by mouth every evening. Reported on 11/24/2015   vitamin E 180 MG (400 UNITS) capsule Take 400 Units by mouth daily.           Objective:   Physical Exam BP 116/72 (BP Location: Left Arm, Patient Position: Sitting, Cuff Size: Small)   Pulse 84   Temp 98.1 F (36.7 C) (Oral)   Resp 18   Ht 5' 4"  (1.626 m)   Wt 161 lb (73 kg)   SpO2 92%   BMI 27.64 kg/m  General:   Well developed, NAD, BMI noted. HEENT:  Normocephalic . Face symmetric, atraumatic Lungs:  CTA B Normal respiratory effort, no intercostal retractions, no accessory muscle use. Heart: RRR,  no murmur.  Skin: Not pale. Not jaundice Neurologic:  alert & oriented to self, partially oriented to time, oriented to space.  Did not know the name of the president.  She recognizes me and her son. Speech normal, gait not tested.   Psych--  Behavior appropriate. No anxious or depressed appearing.      Assessment    ASSESSMENT DM, Neuropathy, amputations due to Charcot feet. - metformin intolerant  (lethargic) HTN  Hyperlipidemia LUNG MASS -incidental per CT 2009,  PET scan 01-2008: likely  benign, CT 08-2009 no change, no further CTs per Dr Gwenette Greet - Had a CT in [place] 09-2016  "spiculated mass", saw Dr Lamonte Sakai,  CT  09/2017 stable CV: ---CAD sees Dr Meda Coffee and a cardiologist in [facility] --NSTEMI 09-2016. Outside hospital: Cath 2 vessel disease, CABG 2, LIMA to LAD and Diag   ---Chronic diastolic CHF ---PVD: Aortogram and a stent L Leg Dr Feliberto Gottron, 06-27-2017 @ [facility] (per pt) GI: GERD, IBS, colon polyps, PUD, abdominal pain "post polypectomy syndrome" naueas meds prn (antivert-zofran, gets nausea when car traveling) Osteoporosis  MSK,pain mngmt : --Reflex sympathetic dystrophy --Neuropathy --DJD --Spinal stenosis  --H/o Osteomyelitis  Foot --s/p amputation:  4th 5th L toes , R BKA  (01-2016, dx osteomyelitis) w/ phantom pain  -- sees Dr Sharol Given prn She took oxycodone after surgery and that did NOT seem to help better than hydrocodone. Intolerant to Cymbalta, Neurontin ; Lyrica helped but caused edema. Recurrent UTIs: Dr Junious Silk  Venous insufficiency  H/o  + PPD H/o hypothyroidism: TSHs stable H/o dyspnea   PLAN:  Dementia: Having memory issues for about a year, see HPI, exam confirms  decreased memory.  Needs a more formal exam (MMSE) or a referral to psychology.  We agreed that she will come back in 2 to 3 months for reevaluation and MMSE. In the meantime we will start Aricept 5 mg daily.  Watch for  side effects.  The patient and her son are in agreement. She expressed some frustration and thinks that the family is exaggerating her symptoms.  Listening therapy provided. Check RPR, B12, homocystine level and TSH Black stools: Ongoing issue, see previous entries, observation.    Recurrent UTIs: Saw urology 05-2021. MSK, pain management: Working with pain medicine, next appointment tomorrow Preventive care, flu shot today, rec a COVID booster RTC 2 to 3 months   Time spent: 33 minutes, listening to the patient's son concerns, listening to the patient herself, explaining the benefits of Aricept and the need to check for blood.  This visit occurred during the SARS-CoV-2  public health emergency.  Safety protocols were in place, including screening questions prior to the visit, additional usage of staff PPE, and extensive cleaning of exam room while observing appropriate contact time as indicated for disinfecting solutions.

## 2021-07-27 NOTE — Patient Instructions (Addendum)
Start taking Aricept 1 tablet daily.  You can take it either in the morning or at night  Proceed with a COVID booster at your earliest convenience     GO TO THE LAB : Get the blood work     [place], [place] back for a checkup in 2 to 3 months

## 2021-07-28 DIAGNOSIS — M542 Cervicalgia: Secondary | ICD-10-CM | POA: Diagnosis not present

## 2021-07-28 DIAGNOSIS — G894 Chronic pain syndrome: Secondary | ICD-10-CM | POA: Diagnosis not present

## 2021-07-28 DIAGNOSIS — Z6827 Body mass index (BMI) 27.0-27.9, adult: Secondary | ICD-10-CM | POA: Diagnosis not present

## 2021-07-28 DIAGNOSIS — M5126 Other intervertebral disc displacement, lumbar region: Secondary | ICD-10-CM | POA: Diagnosis not present

## 2021-07-28 LAB — B12 AND FOLATE PANEL
Folate: 9.5 ng/mL (ref >5.9)
Vitamin B-12: >1550 pg/mL — ABNORMAL HIGH (ref 211–911)

## 2021-07-28 LAB — TSH: TSH: 2.11 u[IU]/mL (ref 0.35–5.50)

## 2021-07-29 LAB — RPR: RPR Ser Ql: NONREACTIVE

## 2021-07-29 LAB — HOMOCYSTEINE: Homocysteine: 9 umol/L (ref <10.4)

## 2021-07-29 NOTE — Assessment & Plan Note (Signed)
Dementia: Having memory issues for about a year, see HPI, exam confirms  decreased memory.  Needs a more formal exam (MMSE) or a referral to psychology.  We agreed that she will come back in 2 to 3 months for reevaluation and MMSE. In the meantime we will start Aricept 5 mg daily.  Watch for side effects.  The patient and her son are in agreement. She expressed some frustration and thinks that the family is exaggerating her symptoms.  Listening therapy provided. Check RPR, B12, homocystine level and TSH Black stools: Ongoing issue, see previous entries, observation.    Recurrent UTIs: Saw urology 05-2021. MSK, pain management: Working with pain medicine, next appointment tomorrow Preventive care, flu shot today, rec a COVID booster RTC 2 to 3 months

## 2021-08-16 ENCOUNTER — Ambulatory Visit (INDEPENDENT_AMBULATORY_CARE_PROVIDER_SITE_OTHER): Payer: Medicare Other

## 2021-08-16 ENCOUNTER — Other Ambulatory Visit: Payer: Self-pay

## 2021-08-16 ENCOUNTER — Telehealth: Payer: Self-pay

## 2021-08-16 DIAGNOSIS — M810 Age-related osteoporosis without current pathological fracture: Secondary | ICD-10-CM | POA: Diagnosis not present

## 2021-08-16 MED ORDER — DENOSUMAB 60 MG/ML ~~LOC~~ SOSY
60.0000 mg | PREFILLED_SYRINGE | Freq: Once | SUBCUTANEOUS | Status: AC
  Administered 2021-08-16: 60 mg via SUBCUTANEOUS

## 2021-08-16 NOTE — Progress Notes (Signed)
[name] is a 86 y.o. female presents to the office today for Prolia injection, per physician's orders. Original order: 04/01/21- Dr Larose Kells: "Continue with Prolia"  Prolia 60 mg SQ, was administered L arm today. Patient tolerated injection. Patient due for follow up labs/provider appt: No. Patient next injection due: 6 months, appt made No- will wait for approval with insurance  [place], Darlis Loan

## 2021-08-16 NOTE — Telephone Encounter (Signed)
Prolia given today. Due: 02/14/21

## 2021-08-17 ENCOUNTER — Encounter: Payer: Self-pay | Admitting: Neurology

## 2021-08-17 ENCOUNTER — Ambulatory Visit (INDEPENDENT_AMBULATORY_CARE_PROVIDER_SITE_OTHER): Payer: Medicare Other | Admitting: Neurology

## 2021-08-17 VITALS — BP 101/67 | HR 82 | Ht 60.0 in | Wt 150.0 lb

## 2021-08-17 DIAGNOSIS — G43711 Chronic migraine without aura, intractable, with status migrainosus: Secondary | ICD-10-CM | POA: Diagnosis not present

## 2021-08-17 MED ORDER — EMGALITY 120 MG/ML ~~LOC~~ SOAJ: 120.0000 mg | SUBCUTANEOUS | 11 refills | Status: DC

## 2021-08-17 NOTE — Progress Notes (Signed)
[facility]  Provider:  Dr Jaynee Eagles Requesting Provider: Colon Branch, MD Primary Care Provider:  Colon Branch, MD  CC:  chronic migraines  Interval history 08/17/2021: Patient here for follow up of migraines. Patient with multiple complaints however at last appointment we discussed I can follow her for her headaches. She was last started on Emgality. She has not been able to get the [facility]. She is getting headaches every other day.Happens in the afternoon. Gave her samples for 5 months and will see her back.   HPI:  ANVITHA HUTMACHER is a 86 y.o. female here as requested by Colon Branch, MD for neck pain.  Patient is well-known to [place] neurolog[facility] and we have seen her for multiple conditions.  She has a very complicated and extensive history including a past medical history of chronic migraines, spinal stenosis, reflex sympathetic dystrophy, osteoporosis, orthostatic hypotension, polyneuropathy, hypertension, hyperlipidemia, falls, head injury due to trauma, DVTs, degenerative joint disease, chronic diastolic congestive heart failure, coronary artery disease, hearing loss, arthritis, chronic pain, peripheral vascular disease, diabetes, depression, lower extremity edema, partial colectomy, memory loss, below the knee amputation. No significant snoring. She doesn't wake up with headaches. She has had headaches so bad she had nausea but that may have been the new medication. We need to restart Emgality, she only had 2 doses, will give samples of emgality to last 5 months and see if the cgrps even help.She was started on Tramadol for pain. She was given aricept by Dr. Larose Kells.  Here with her son who provides most information. She had 2 months of Emgality and they stopped they had never heard from the pharmacy about it.   Patient today is here with her son who also provides much information.  Patient has a long history of multiple chronic pain disorders of which she we have evaluated and treated her for  chronic migraines migraines, chronic dizziness and vertigo, headaches, chronic neck pain, multiple falls, ocular migraines.  Today she is here stating she still having headaches and migraines, she has chronic neck and back pain, she has tried heat, massage, dry needling in her shoulders in the past, she also reports that she still having falls however talking to her son it appears that she bent over to pick up a dog bed which she really should not have been doing giving her multiple risk factors for falls, we have tried multiple medications for her migraines, and patient currently has multiple medications on her list polypharmacy.  I had a long discussion with her and her son today, at this time I do not feel as though we should put another oral medication on her list we can try Emgality for her and I did inject her in the office today for her migraines.  Patient complaining of falls again at this time I told patient she has multiple risk factors for falls and she cannot do things like bend over and pick up dog beds that puts her more at risk of falls, there is nothing that we can do at this point to mitigate her fall risk except have her not do things that we will inevitably lead to her losing her balance.  Review of Systems: Patient complains of symptoms per HPI as well as the following symptoms: migraines, dizziness, neck pain, back pain, falls. Pertinent negatives and positives per HPI. All others negative.   Social History   Socioeconomic History   Marital status: Divorced    Spouse name: Not on  file   Number of children: 3   Years of education: 15   Highest education level: Not on file  Occupational History   Occupation: Retired     Comment: from social services   Tobacco Use   Smoking status: Never   Smokeless tobacco: Never   Tobacco comments:    tried for a few months in college.   Vaping Use   Vaping Use: Never used  Substance and Sexual Activity   Alcohol use: No    Alcohol/week:  0.0 standard drinks   Drug use: No   Sexual activity: Not Currently    Birth control/protection: Post-menopausal  Other Topics Concern   Not on file  Social History Narrative   Daughter [name] deceased 09-13-2020   daughter lives in town   1 son in [place], Clovia Cuff comes home every 4 weeks to visit   Caffeine use:  Tea 1/day w/ dinner   Coffee occass          Currently lives in [place] with son Glendale Wherry.   Social Determinants of Health   Financial Resource Strain: Low Risk    Difficulty of Paying Living Expenses: Not hard at all  Food Insecurity: No Food Insecurity   Worried About Charity fundraiser in the Last Year: Never true   [place] in the Last Year: Never true  Transportation Needs: No Transportation Needs   Lack of Transportation (Medical): No   Lack of Transportation (Non-Medical): No  Physical Activity: Inactive   Days of Exercise per Week: 0 days   Minutes of Exercise per Session: 0 min  Stress: No Stress Concern Present   Feeling of Stress : Not at all  Social Connections: Socially Isolated   Frequency of Communication with Friends and Family: More than three times a week   Frequency of Social Gatherings with Friends and Family: Once a week   Attends Religious Services: Never   Marine scientist or Organizations: No   Attends Archivist Meetings: Never   Marital Status: Widowed  Human resources officer Violence: Not At Risk   Fear of Current or Ex-Partner: No   Emotionally Abused: No   Physically Abused: No   Sexually Abused: No    Family History  Problem Relation Age of Onset   Heart disease Mother        mitral valve replaced   Arthritis Mother    Diabetic kidney disease Daughter    Diabetes Father    Stroke Father    Migraines Sister    Hypertension Other    Breast cancer Other    Colon cancer Neg Hx    Esophageal cancer Neg Hx    Stomach cancer Neg Hx    Rectal cancer Neg Hx     Past Medical History:  Diagnosis Date    Allergic rhinitis    Arthritis    "back; ankles; hands; knees" (10/31/2013)   Bilateral sensorineural hearing loss    CAD (coronary artery disease)    a. Nonobst in 2011. b. Abnormal nuc 09/2013 -> s/p cutting balloon to D2, mild LAD disease; c. September 14, 2015 MV: no ischemia, EF 71%.   Carcinoma in situ in a polyp 1994   a. 1994 - malignant polyp removed during colonoscopy.   Chronic diastolic CHF (congestive heart failure) ([place])    a. 09/2013 Echo: EF 60-65%, mild LVH, Gr1 DD.   Dementia ([place])    Diverticulosis    DJD (degenerative joint disease)  Fatty liver    GERD (gastroesophageal reflux disease)    a. Hx GERD/esophageal dysmotility followed by Dr. Olevia Perches.    Hemorrhoids    Hiatal hernia    a. s/p Nissen fundoplication 6599.   Hyperlipidemia    a. patient unwilling to use statins.   Hypertensive heart disease    IBS (irritable bowel syndrome)    Macular degeneration 03/2009   Dr. Rosana Hoes   Neuropathy    a. Hands, feet, legs.   Orthostatic hypotension    Osteomyelitis (HCC)    a. 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 01/2016 s/p RLE transtibial amputation 2/2 Charcot rocker-bottom deformity and insensate neuropathy ulceration.   Osteoporosis    PE (pulmonary embolism) 12/2007   a. PE/DVT after neck surgery 2009. b. coumadin d/c 10-2008.   PONV (postoperative nausea and vomiting) 2009   neck surgery   PPD positive    Recurrent UTI    RSD (reflex sympathetic dystrophy)    a. Chronic pain.   Spinal stenosis    Type II diabetes mellitus ([place])    no on medication   Venous insufficiency    a. Contributing to LEE.    Patient Active Problem List   Diagnosis Date Noted   Cervical myelopathy ([place]) 11/29/2020   Chronic idiopathic constipation 11/12/2020   Lumbar compression fracture ([place]) 09/29/2020   Other spondylosis with radiculopathy, cervical region 09/29/2020   External hemorrhoids 02/15/2018   History of three  vessel coronary artery bypass - 2017 , in [facility] 09/25/2017   PVD (peripheral vascular disease) ([place])-- stent L leg 06-2017 Dr Feliberto [facility]: [place] Rhonda W 07/23/2017   Phantom pain ([place]) 04/01/2017   Constipation, chronic 04/01/2017   S/P unilateral BKA (below knee amputation), right ([place]) 12/14/2016   Toe osteomyelitis, left ([place]) 11/17/2016   Acute on chronic diastolic CHF (congestive heart failure), NYHA class 3 ([place]) 03/30/2016   Coronary artery disease due to lipid rich plaque 03/30/2016   Acute renal failure superimposed on stage 3 chronic kidney disease ([place]) 03/30/2016   S/P below knee amputation ([place]) 02/18/2016   Memory loss 11/26/2015   PCP NOTES >>>>>>>>>>>>>>>>>>>> 10/18/2015   S/P partial colectomy 01/08/2015   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 01/28/2014   Anemia 01/18/2014   CAD (coronary artery disease)    Carcinoma in situ in a polyp    Depression 09/17/2013   Charcot's joint 08/21/2013   Foot osteomyelitis, right ([place]) 07/16/2013   Chronic pain associated with significant psychosocial dysfunction 05/06/2013   Neuropathy    Hyperthyroidism    Lower extremity edema 03/08/2011   Annual physical exam 03/08/2011   ALLERGIC RHINITIS 04/15/2010   ORTHOSTATIC DIZZINESS 04/13/2010   HIATAL HERNIA 12/06/2009   GASTRIC ULCER, HX OF 12/06/2009   DYSPNEA 03/18/2009   DEGENERATIVE JOINT DISEASE 06/03/2008   COLONIC POLYPS 01/27/2008   DIVERTICULOSIS, COLON 01/27/2008   IRRITABLE BOWEL SYNDROME, HX OF 01/27/2008   Pulmonary nodule, right 01/22/2008   Hyperlipidemia 12/30/2007   Osteoporosis 12/30/2007   TB SKIN TEST, POSITIVE 08/09/2007   DM type 2 with diabetic peripheral neuropathy ([place]) 05/09/2007   Reflex sympathetic dystrophy-- PAIN MNGMT  05/09/2007   Essential hypertension 04/15/2007   GERD 04/15/2007    Past Surgical History:  Procedure Laterality Date   AMPUTATION  03/06/2012   Procedure: AMPUTATION FOOT;  Surgeon: Newt Minion, MD;  Location: [place];   Service: Orthopedics;  Laterality: Left;  FIFTH RAY AMPUTATION    AMPUTATION Right 02/18/2016  Procedure: AMPUTATION BELOW KNEE;  Surgeon: Newt Minion, MD;  Location: [place];  Service: Orthopedics;  Laterality: Right;   AMPUTATION Left 11/22/2016   Procedure: Amputation 4th Toe Left Foot at Metatarsophalangeal Joint;  Surgeon: Newt Minion, MD;  Location: [place];  Service: Orthopedics;  Laterality: Left;   ANKLE FUSION  09/27/2012   Procedure: ANKLE FUSION;  Surgeon: Newt Minion, MD;  Location: [place];  Service: Orthopedics;  Laterality: Left;  Left Tibiocalcaneal Fusion   ANKLE FUSION Right 05/09/2013   Procedure: ANKLE FUSION;  Surgeon: Newt Minion, MD;  Location: [place];  Service: Orthopedics;  Laterality: Right;  Excision Osteomyelitis Base 1st MT Right Foot, Fusion Medial Column   ANTERIOR CERVICAL DECOMP/DISCECTOMY FUSION  12/18/07   For OA,  Dr. Lorin Mercy:  fu by a PE   ANTERIOR CERVICAL DECOMP/DISCECTOMY FUSION N/A 11/29/2020   Procedure: Anterior Cervical Decompression Fusion - Cervical Three-Cerivcal Four;  Surgeon: Earnie Larsson, MD;  Location: [place];  Service: Neurosurgery;  Laterality: N/A;  3C   CARDIAC CATHETERIZATION  05/2010    at [place] Left 09/2011   CATARACT EXTRACTION W/ INTRAOCULAR LENS  IMPLANT, BILATERAL  2004   feb 2004 left, aug 2004 right   CHOLECYSTECTOMY  03/2002   CORONARY ANGIOPLASTY  10/31/2013   CORONARY ARTERY BYPASS GRAFT  09/27/2016   CYST REMOVAL HAND  06/2003   FOOT BONE EXCISION Right 06/2009   LAPAROSCOPIC RIGHT HEMI COLECTOMY N/A 01/08/2015   Procedure: LAPAROSCOPIC ASSISTED RIGHT HEMI COLECTOMY;  Surgeon: Johnathan Hausen, MD;  Location: [facility];  Service: General;  Laterality: N/A;   LEFT HEART CATHETERIZATION WITH CORONARY ANGIOGRAM N/A 10/31/2013   Procedure: LEFT HEART CATHETERIZATION WITH CORONARY ANGIOGRAM;  Surgeon: Jettie Booze, MD;  Location: [facility] CATH LAB;  Service: Cardiovascular;  Laterality: N/A;   NASAL SEPTUM SURGERY   28/3151   NISSEN FUNDOPLICATION  04/27/1606   PERCUTANEOUS CORONARY INTERVENTION-BALLOON ONLY  10/31/2013   Procedure: PERCUTANEOUS CORONARY INTERVENTION-BALLOON ONLY;  Surgeon: Jettie Booze, MD;  Location: [facility] CATH LAB;  Service: Cardiovascular;;   ROTATOR CUFF REPAIR Right 07/2007   Dr. Morene Rankins CUFF REPAIR Left 11/2004   stent in left leg, behind knee  06/27/2017   x2, done at [facility] cardiology in [place] Right 08/2008   3rd toe, Dr. Sharol Given due to osteomyelitis   VAGINAL HYSTERECTOMY  03/1975   VEIN LIGATION Bilateral 03/1966   VENA CAVA FILTER PLACEMENT  01/2010   green filter; "due to blood clots"   WISDOM TOOTH EXTRACTION  06/2007    Current Outpatient Medications  Medication Sig Dispense Refill   Alpha-Lipoic Acid 600 MG CAPS Take 1,200 mg by mouth daily.     amitriptyline (ELAVIL) 25 MG tablet TAKE 1 TABLET(25 MG) BY MOUTH AT BEDTIME 90 tablet 1   Ascorbic Acid (VITAMIN C WITH ROSE HIPS) 500 MG tablet Take 500 mg by mouth in the morning, at noon, and at bedtime.     atorvastatin (LIPITOR) 40 MG tablet Take 40 mg by mouth daily.     carvedilol (COREG) 12.5 MG tablet Take 12.5 mg by mouth 2 (two) times daily with a meal.     Cholecalciferol (VITAMIN D3) 5000 UNITS CAPS Take 5,000 Units by mouth every evening. Reported on 11/24/2015     clopidogrel (PLAVIX) 75 MG tablet Take 1 tablet (75 mg total) by mouth at bedtime. Restart on 12/04/2020     Cyanocobalamin (  VITAMIN B-12) 500 MCG SUBL Place 5,000 mcg under the tongue daily.     diclofenac Sodium (VOLTAREN) 1 % GEL Apply 2-4 g topically 4 (four) times daily as needed. (Patient taking differently: Apply 2-4 g topically 4 (four) times daily as needed (joint pain).) 100 g 5   dicyclomine (BENTYL) 10 MG capsule TAKE 1 CAPSULE THREE TIMES A DAY BEFORE MEALS (NEED APPOINTMENT) 360 capsule 0   donepezil (ARICEPT) 5 MG tablet Take 1 tablet (5 mg total) by mouth at bedtime. 90 tablet 1   famotidine (PEPCID) 20 MG  tablet Take 1 tablet (20 mg total) by mouth 2 (two) times daily. 180 tablet 3   ferrous sulfate (FEROSUL) 325 (65 FE) MG tablet Take 1 tablet (325 mg total) by mouth 2 (two) times daily with a meal. 180 tablet 3   furosemide (LASIX) 20 MG tablet Take 20 mg by mouth daily.     Galcanezumab-gnlm (EMGALITY) 120 MG/ML SOAJ Inject 120 mg into the skin every 30 (thirty) days. 1 mL 11   glucose blood (FREESTYLE LITE) test strip USE TO CHECK BLOOD SUGAR NO MORE THAN TWICE DAILY 200 each 12   HYDROcodone-acetaminophen (NORCO) 10-325 MG tablet Take 1 tablet by mouth every 6 (six) hours as needed. 120 tablet 0   hydrocortisone (ANUSOL-HC) 2.5 % rectal cream Place 1 application rectally 2 (two) times daily. As needed (Patient not taking: No sig reported) 30 g 3   hydrocortisone (ANUSOL-HC) 25 MG suppository Place 1 suppository (25 mg total) rectally 2 (two) times daily. As needed (Patient not taking: No sig reported) 12 suppository 5   hydrocortisone 2.5 % cream Apply topically 2 (two) times daily. 30 g 1   Lancets (FREESTYLE) lancets CHECK BLOOD SUGAR TWICE DAILY 200 each 12   loperamide (IMODIUM A-D) 2 MG tablet Take 2 mg by mouth as needed for diarrhea or loose stools.     lubiprostone ([name]) 24 MCG capsule Take 1 capsule (24 mcg total) by mouth 2 (two) times daily with a meal. 60 capsule 2   MegaRed Omega-3 Krill Oil 500 MG CAPS Restart on 12/04/2020     methenamine (HIPREX) 1 g tablet Take 0.5 tablets by mouth daily.  4   Multiple Vitamins-Minerals (PRESERVISION AREDS 2) CAPS Take 1 capsule by mouth 2 (two) times daily.     naloxone (NARCAN) nasal spray 4 mg/0.1 mL Place 1 spray into the nose once as needed for up to 1 dose. (Patient not taking: No sig reported) 2 each 0   nitroGLYCERIN (NITROSTAT) 0.4 MG SL tablet Place 1 tablet (0.4 mg total) under the tongue every 5 (five) minutes x 3 doses as needed for chest pain. (Patient not taking: No sig reported) 25 tablet 3   omeprazole (PRILOSEC) 20 MG  capsule Take 1 capsule (20 mg total) by mouth 2 (two) times daily before a meal. 180 capsule 3   ondansetron (ZOFRAN) 4 MG tablet TAKE 1 TABLET(4 MG) BY MOUTH EVERY 8 HOURS AS NEEDED FOR NAUSEA OR VOMITING 40 tablet 0   OVER THE COUNTER MEDICATION Take 1 capsule by mouth 2 (two) times daily as needed (digestive issues). Integrative Digestive Formula     OVER THE COUNTER MEDICATION 1 tablet 3 (three) times daily. Berberorine Gluco Defense     Polyvinyl Alcohol-Povidone (REFRESH OP) Place 1 drop into both eyes 3 (three) times daily as needed (dry eyes).      pramipexole (MIRAPEX) 0.125 MG tablet TAKE 1 TO 2 TABLETS(0.125 TO 0.25 MG) BY MOUTH  AT BEDTIME AS NEEDED 180 tablet 1   rivaroxaban (XARELTO) 2.5 MG TABS tablet Take 1 tablet (2.5 mg total) by mouth 2 (two) times daily. Restart on 12/04/2020 60 tablet    vitamin E 180 MG (400 UNITS) capsule Take 400 Units by mouth daily.     No current facility-administered medications for this visit.    Allergies as of 08/17/2021 - Review Complete 07/27/2021  Allergen Reaction Noted   Cymbalta [duloxetine hcl] Swelling 09/25/2012   Gabapentin Swelling 81/82/9937   Silicone Hives, Itching, Dermatitis, and Rash    Metformin and related Other (See Comments) 12/10/2015   Adhesive [tape] Rash 07/17/2011   Cefazolin Hives 03/06/2012   Ciprofloxacin Other (See Comments) 01/31/2013   Clorazepate dipotassium Other (See Comments)    Dilaudid [hydromorphone hcl] Other (See Comments) 03/11/2010   Doxycycline Rash 11/03/2008   Enablex [darifenacin hydrobromide er] Other (See Comments) 03/06/2012   Levofloxacin Other (See Comments) 01/08/2014   Lyrica [pregabalin] Swelling 05/08/2013   Methadone hcl Other (See Comments)    Morphine and related Other (See Comments) 10/31/2013   Talwin [pentazocine] Other (See Comments) 12/04/2012    Vitals: There were no vitals taken for this visit. Last Weight:  Wt Readings from Last 1 Encounters:  07/27/21 161 lb (73 kg)    Last Height:   Ht Readings from Last 1 Encounters:  07/27/21 5\' 4"  (1.626 m)     Physical exam: Exam: Gen: NAD, conversant, well nourised,  Neuro: Detailed Neurologic Exam  Speech:    Speech is normal; fluent and spontaneous with normal comprehension.  Cognition:    The patient is oriented to person, place, and time;  Cranial Nerves:    The pupils are equal, round, and reactive to light. Visual fields are full to threat. Extraocular movements are intact. Trigeminal sensation is intact and the muscles of mastication are normal. The face is symmetric. The palate elevates in the midline. Hearing impaired. Voice is normal. Shoulder shrug is normal. The tongue has normal motion without fasciculations.   Gait:   In a wheelchair  Motor Observation:    No asymmetry, no atrophy, and no involuntary movements noted. Tone:    Normal muscle tone.     Strength:    Strength is actually quite good and I do not appreciate any focal weakness. BKA., left LE RSD, she has leg brace     Assessment/Plan:  86 y.o. female here as requested by Newt Minion, MD for neck pain.  Patient is well-known to [place] neurology and we have seen her for multiple conditions.  She has a very complicated and extensive history including a past medical history of chronic migraines, spinal stenosis, reflex sympathetic dystrophy, osteoporosis, orthostatic hypotension, polyneuropathy, hypertension, hyperlipidemia, falls, head injury due to trauma, DVTs, degenerative joint disease, chronic diastolic congestive heart failure, coronary artery disease, hearing loss, arthritis, chronic pain, peripheral vascular disease, diabetes, depression, lower extremity edema, partial colectomy, memory loss, below the knee amputation, joint pain, reflex sympathetic dystrophy leg, BKA. She has been to orthopaedics and pain specialists and currently sees Dr. Sharol Given in Ortho, many years of chronic neck and back pain and ambulation issues for many  reasons, she has tried multiple neuropathic pain medications in the past.   - she hadn't taken Emgality, gave samples, will try that as current plan and see her back in 5 months  -  I had a long discussion with her and her son today, at this time I do not feel as though we should  put another oral medication on her list due to polypharmacy, we can try Emgality for her and I did inject her in the office today for her migraines.   - Patient complaining of falls again and not understanding why she falls - I told patient she has multiple risk factors for falls and she cannot do things like bend over and pick up dog beds because that puts her more at risk of falls; doing things like this will inevitably lead to her losing her balance due to her extensive problems as above. I will image her brain and cervical spine to ensure no strokes, myelopathy or other causes but at this time patient needs to be compliant with fall prevention recommendations.  - Patient already sees orthopaedics for her many years of chronic neck and back pain and is managed by Dr. Sharol Given who recently adjusted some pain management medications. She can continue to follow with Dr. Sharol Given but I will image her brain and cervical spine as above. - Spent a very long time discussing fall risks, polypharmacy, fall prevention, migraines, chronic neck and back pain.We willimage her briain and cervical spine. Patient to follow with Dr. Sharol Given and pain management for these. We can continue to follow her for migraines.  No orders of the defined types were placed in this encounter.  No orders of the defined types were placed in this encounter.   Cc: Colon Branch, MD,  Colon Branch, MD  Sarina Ill, MD  [facility] [address] [place],  [address]  Phone [phone] Fax [phone]  I spent 20 minutes of face-to-face and non-face-to-face time with patient on the  1. Chronic migraine without aura, with  intractable migraine, so stated, with status migrainosus    diagnosis.  This included previsit chart review, lab review, study review, order entry, electronic health record documentation, patient education on the different diagnostic and therapeutic options, counseling and coordination of care, risks and benefits of management, compliance, or risk factor reduction

## 2021-08-31 ENCOUNTER — Other Ambulatory Visit: Payer: Self-pay | Admitting: Internal Medicine

## 2021-08-31 DIAGNOSIS — G894 Chronic pain syndrome: Secondary | ICD-10-CM | POA: Diagnosis not present

## 2021-08-31 DIAGNOSIS — M5416 Radiculopathy, lumbar region: Secondary | ICD-10-CM | POA: Diagnosis not present

## 2021-09-01 ENCOUNTER — Telehealth: Payer: Self-pay | Admitting: Internal Medicine

## 2021-09-01 NOTE — Telephone Encounter (Signed)
Patient called to get an appointment for pain under her left arm from under her breast towards her back. Appointment was made for 11/11 and pt was triaged.

## 2021-09-02 ENCOUNTER — Emergency Department (HOSPITAL_BASED_OUTPATIENT_CLINIC_OR_DEPARTMENT_OTHER): Payer: Medicare Other

## 2021-09-02 ENCOUNTER — Other Ambulatory Visit: Payer: Self-pay

## 2021-09-02 ENCOUNTER — Encounter: Payer: Self-pay | Admitting: Family

## 2021-09-02 ENCOUNTER — Ambulatory Visit (INDEPENDENT_AMBULATORY_CARE_PROVIDER_SITE_OTHER): Payer: Medicare Other | Admitting: Family

## 2021-09-02 ENCOUNTER — Encounter (HOSPITAL_BASED_OUTPATIENT_CLINIC_OR_DEPARTMENT_OTHER): Payer: Self-pay | Admitting: *Deleted

## 2021-09-02 ENCOUNTER — Emergency Department (HOSPITAL_BASED_OUTPATIENT_CLINIC_OR_DEPARTMENT_OTHER)
Admission: EM | Admit: 2021-09-02 | Discharge: 2021-09-02 | Disposition: A | Discharge status: Home / Self Care | Payer: Medicare Other | Attending: Emergency Medicine | Admitting: Emergency Medicine

## 2021-09-02 VITALS — BP 96/60 | HR 112 | Temp 98.8°F | Ht 60.0 in | Wt 156.0 lb

## 2021-09-02 DIAGNOSIS — Z79899 Other long term (current) drug therapy: Secondary | ICD-10-CM | POA: Insufficient documentation

## 2021-09-02 DIAGNOSIS — I2510 Atherosclerotic heart disease of native coronary artery without angina pectoris: Secondary | ICD-10-CM | POA: Insufficient documentation

## 2021-09-02 DIAGNOSIS — X509XXA Other and unspecified overexertion or strenuous movements or postures, initial encounter: Secondary | ICD-10-CM | POA: Insufficient documentation

## 2021-09-02 DIAGNOSIS — E1151 Type 2 diabetes mellitus with diabetic peripheral angiopathy without gangrene: Secondary | ICD-10-CM | POA: Diagnosis not present

## 2021-09-02 DIAGNOSIS — E1142 Type 2 diabetes mellitus with diabetic polyneuropathy: Secondary | ICD-10-CM | POA: Diagnosis not present

## 2021-09-02 DIAGNOSIS — S4992XA Unspecified injury of left shoulder and upper arm, initial encounter: Secondary | ICD-10-CM | POA: Diagnosis present

## 2021-09-02 DIAGNOSIS — T148XXA Other injury of unspecified body region, initial encounter: Secondary | ICD-10-CM

## 2021-09-02 DIAGNOSIS — R0789 Other chest pain: Secondary | ICD-10-CM | POA: Diagnosis not present

## 2021-09-02 DIAGNOSIS — R079 Chest pain, unspecified: Secondary | ICD-10-CM | POA: Diagnosis not present

## 2021-09-02 DIAGNOSIS — R000 Tachycardia, unspecified: Secondary | ICD-10-CM

## 2021-09-02 DIAGNOSIS — S46312A Strain of muscle, fascia and tendon of triceps, left arm, initial encounter: Secondary | ICD-10-CM | POA: Diagnosis not present

## 2021-09-02 DIAGNOSIS — F0390 Unspecified dementia without behavioral disturbance: Secondary | ICD-10-CM | POA: Diagnosis not present

## 2021-09-02 DIAGNOSIS — I5033 Acute on chronic diastolic (congestive) heart failure: Secondary | ICD-10-CM | POA: Diagnosis not present

## 2021-09-02 DIAGNOSIS — I130 Hypertensive heart and chronic kidney disease with heart failure and stage 1 through stage 4 chronic kidney disease, or unspecified chronic kidney disease: Secondary | ICD-10-CM | POA: Diagnosis not present

## 2021-09-02 DIAGNOSIS — S39011A Strain of muscle, fascia and tendon of abdomen, initial encounter: Secondary | ICD-10-CM | POA: Diagnosis not present

## 2021-09-02 DIAGNOSIS — R197 Diarrhea, unspecified: Secondary | ICD-10-CM | POA: Diagnosis not present

## 2021-09-02 DIAGNOSIS — N183 Chronic kidney disease, stage 3 unspecified: Secondary | ICD-10-CM | POA: Insufficient documentation

## 2021-09-02 LAB — BASIC METABOLIC PANEL
Anion gap: 8 (ref 5–15)
BUN: 12 mg/dL (ref 8–23)
CO2: 22 mmol/L (ref 22–32)
Calcium: 7.6 mg/dL — ABNORMAL LOW (ref 8.9–10.3)
Chloride: 107 mmol/L (ref 98–111)
Creatinine, Ser: 0.96 mg/dL (ref 0.44–1.00)
GFR, Estimated: 58 mL/min — ABNORMAL LOW (ref >60)
Glucose, Bld: 91 mg/dL (ref 70–99)
Potassium: 4.1 mmol/L (ref 3.5–5.1)
Sodium: 137 mmol/L (ref 135–145)

## 2021-09-02 LAB — CBC WITH DIFFERENTIAL/PLATELET
Abs Immature Granulocytes: 0.01 10*3/uL (ref 0.00–0.07)
Basophils Absolute: 0 10*3/uL (ref 0.0–0.1)
Basophils Relative: 0 %
Eosinophils Absolute: 0.1 10*3/uL (ref 0.0–0.5)
Eosinophils Relative: 2 %
HCT: 35.2 % — ABNORMAL LOW (ref 36.0–46.0)
Hemoglobin: 11.3 g/dL — ABNORMAL LOW (ref 12.0–15.0)
Immature Granulocytes: 0 %
Lymphocytes Relative: 28 %
Lymphs Abs: 1.2 10*3/uL (ref 0.7–4.0)
MCH: 31.2 pg (ref 26.0–34.0)
MCHC: 32.1 g/dL (ref 30.0–36.0)
MCV: 97.2 fL (ref 80.0–100.0)
Monocytes Absolute: 0.3 10*3/uL (ref 0.1–1.0)
Monocytes Relative: 6 %
Neutro Abs: 2.8 10*3/uL (ref 1.7–7.7)
Neutrophils Relative %: 64 %
Platelets: 131 10*3/uL — ABNORMAL LOW (ref 150–400)
RBC: 3.62 MIL/uL — ABNORMAL LOW (ref 3.87–5.11)
RDW: 13.9 % (ref 11.5–15.5)
WBC: 4.3 10*3/uL (ref 4.0–10.5)
nRBC: 0 % (ref 0.0–0.2)

## 2021-09-02 LAB — TROPONIN I (HIGH SENSITIVITY): Troponin I (High Sensitivity): 3 ng/L (ref <18)

## 2021-09-02 MED ORDER — DICLOFENAC SODIUM 1 % EX GEL: 4.0000 g | Freq: Four times a day (QID) | CUTANEOUS | 0 refills | Status: DC

## 2021-09-02 NOTE — ED Provider Notes (Signed)
[facility] EMERGENCY DEPARTMENT Provider Note   CSN: [id number] Arrival date & time: 09/02/21  1612     History Chief Complaint  Patient presents with   Muscle Pain    [name] is a 86 y.o. female.  86 yo F with a cc of L sided back pain.  Going on for about 4 days.  She denies any injury initially denies anything that makes it worse and then later states that sometimes it is worse when she lays on her left arm.  She denies cough congestion or fever.  Has a history of an MI in the past with next felt different.  Has a history of a PE in the past but also thinks that felt different.  Compliant with her Xarelto.  She was seen in her doctor's office upstairs and they sent her down for further evaluation.  The history is provided by the patient.  Muscle Pain Associated symptoms include chest pain. Pertinent negatives include no headaches and no shortness of breath.  Illness Severity:  Moderate Onset quality:  Gradual Duration:  4 days Timing:  Constant Progression:  Worsening Chronicity:  New Associated symptoms: chest pain   Associated symptoms: no congestion, no fever, no headaches, no myalgias, no nausea, no rhinorrhea, no shortness of breath, no vomiting and no wheezing       Past Medical History:  Diagnosis Date   Allergic rhinitis    Arthritis    "back; ankles; hands; knees" (10/31/2013)   Bilateral sensorineural hearing loss    CAD (coronary artery disease)    a. Nonobst in 2011. b. Abnormal nuc 09/2013 -> s/p cutting balloon to D2, mild LAD disease; c. 08/2015 MV: no ischemia, EF 71%.   Carcinoma in situ in a polyp 1994   a. 1994 - malignant polyp removed during colonoscopy.   Chronic diastolic CHF (congestive heart failure) ([place])    a. 09/2013 Echo: EF 60-65%, mild LVH, Gr1 DD.   Dementia ([place])    Diverticulosis    DJD (degenerative joint disease)    Fatty liver    GERD (gastroesophageal reflux disease)    a. Hx GERD/esophageal dysmotility  followed by Dr. Olevia Perches.    Hemorrhoids    Hiatal hernia    a. s/p Nissen fundoplication 1027.   Hyperlipidemia    a. patient unwilling to use statins.   Hypertensive heart disease    IBS (irritable bowel syndrome)    Macular degeneration 03/2009   Dr. Rosana Hoes   Neuropathy    a. Hands, feet, legs.   Orthostatic hypotension    Osteomyelitis (HCC)    a. 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 01/2016 s/p RLE transtibial amputation 2/2 Charcot rocker-bottom deformity and insensate neuropathy ulceration.   Osteoporosis    PE (pulmonary embolism) 12/2007   a. PE/DVT after neck surgery 2009. b. coumadin d/c 10-2008.   PONV (postoperative nausea and vomiting) 2009   neck surgery   PPD positive    Recurrent UTI    RSD (reflex sympathetic dystrophy)    a. Chronic pain.   Spinal stenosis    Type II diabetes mellitus ([place])    no on medication   Venous insufficiency    a. Contributing to LEE.    Patient Active Problem List   Diagnosis Date Noted   Chronic migraine without aura, with intractable migraine, so stated, with status migrainosus 08/17/2021   Cervical myelopathy (HCC) 11/29/2020   Chronic idiopathic constipation 11/12/2020  Lumbar compression fracture (HCC) 09/29/2020   Other spondylosis with radiculopathy, cervical region 09/29/2020   External hemorrhoids 02/15/2018   History of three vessel coronary artery bypass - 2017 , in [facility] 09/25/2017   PVD (peripheral vascular disease) ([place])-- stent L leg 06-2017 Dr Feliberto [facility] 07/23/2017   Phantom pain ([place]) 04/01/2017   Constipation, chronic 04/01/2017   S/P unilateral BKA (below knee amputation), right ([place]) 12/14/2016   Toe osteomyelitis, left ([place]) 11/17/2016   Acute on chronic diastolic CHF (congestive heart failure), NYHA class 3 ([place]) 03/30/2016   Coronary artery disease due to lipid rich plaque 03/30/2016   Acute renal failure superimposed on stage 3 chronic kidney disease  ([place]) 03/30/2016   S/P below knee amputation ([place]) 02/18/2016   Memory loss 11/26/2015   PCP NOTES >>>>>>>>>>>>>>>>>>>> 10/18/2015   S/P partial colectomy 01/08/2015   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 01/28/2014   Anemia 01/18/2014   CAD (coronary artery disease)    Carcinoma in situ in a polyp    Depression 09/17/2013   Charcot's joint 08/21/2013   Foot osteomyelitis, right ([place]) 07/16/2013   Chronic pain associated with significant psychosocial dysfunction 05/06/2013   Neuropathy    Hyperthyroidism    Lower extremity edema 03/08/2011   Annual physical exam 03/08/2011   Tachycardia 12/07/2010   ALLERGIC RHINITIS 04/15/2010   ORTHOSTATIC DIZZINESS 04/13/2010   HIATAL HERNIA 12/06/2009   GASTRIC ULCER, HX OF 12/06/2009   DYSPNEA 03/18/2009   DEGENERATIVE JOINT DISEASE 06/03/2008   COLONIC POLYPS 01/27/2008   DIVERTICULOSIS, COLON 01/27/2008   IRRITABLE BOWEL SYNDROME, HX OF 01/27/2008   Pulmonary nodule, right 01/22/2008   Hyperlipidemia 12/30/2007   Osteoporosis 12/30/2007   TB SKIN TEST, POSITIVE 08/09/2007   DM type 2 with diabetic peripheral neuropathy ([place]) 05/09/2007   Reflex sympathetic dystrophy-- PAIN MNGMT  05/09/2007   Essential hypertension 04/15/2007   GERD 04/15/2007    Past Surgical History:  Procedure Laterality Date   AMPUTATION  03/06/2012   Procedure: AMPUTATION FOOT;  Surgeon: Newt Minion, MD;  Location: [facility];  Service: Orthopedics;  Laterality: Left;  FIFTH RAY AMPUTATION    AMPUTATION Right 02/18/2016   Procedure: AMPUTATION BELOW KNEE;  Surgeon: Newt Minion, MD;  Location: [facility];  Service: Orthopedics;  Laterality: Right;   AMPUTATION Left 11/22/2016   Procedure: Amputation 4th Toe Left Foot at Metatarsophalangeal Joint;  Surgeon: Newt Minion, MD;  Location: [facility];  Service: Orthopedics;  Laterality: Left;   ANKLE FUSION  09/27/2012   Procedure: ANKLE FUSION;  Surgeon: Newt Minion, MD;  Location: [facility];  Service:  Orthopedics;  Laterality: Left;  Left Tibiocalcaneal Fusion   ANKLE FUSION Right 05/09/2013   Procedure: ANKLE FUSION;  Surgeon: Newt Minion, MD;  Location: [facility];  Service: Orthopedics;  Laterality: Right;  Excision Osteomyelitis Base 1st MT Right Foot, Fusion Medial Column   ANTERIOR CERVICAL DECOMP/DISCECTOMY FUSION  12/18/07   For OA,  Dr. Lorin Mercy:  fu by a PE   ANTERIOR CERVICAL DECOMP/DISCECTOMY FUSION N/A 11/29/2020   Procedure: Anterior Cervical Decompression Fusion - Cervical Three-Cerivcal Four;  Surgeon: Earnie Larsson, MD;  Location: [facility];  Service: Neurosurgery;  Laterality: N/A;  3C   CARDIAC CATHETERIZATION  05/2010    at [facility] Left 09/2011   CATARACT EXTRACTION W/ INTRAOCULAR LENS  IMPLANT, BILATERAL  2004   feb 2004 left, aug 2004 right   CHOLECYSTECTOMY  03/2002   CORONARY ANGIOPLASTY  10/31/2013  CORONARY ARTERY BYPASS GRAFT  09/27/2016   CYST REMOVAL HAND  06/2003   FOOT BONE EXCISION Right 06/2009   LAPAROSCOPIC RIGHT HEMI COLECTOMY N/A 01/08/2015   Procedure: LAPAROSCOPIC ASSISTED RIGHT HEMI COLECTOMY;  Surgeon: Johnathan Hausen, MD;  Location: [facility];  Service: General;  Laterality: N/A;   LEFT HEART CATHETERIZATION WITH CORONARY ANGIOGRAM N/A 10/31/2013   Procedure: LEFT HEART CATHETERIZATION WITH CORONARY ANGIOGRAM;  Surgeon: Jettie Booze, MD;  Location: [facility] CATH LAB;  Service: Cardiovascular;  Laterality: N/A;   NASAL SEPTUM SURGERY  16/1096   NISSEN FUNDOPLICATION  0/01/5408   PERCUTANEOUS CORONARY INTERVENTION-BALLOON ONLY  10/31/2013   Procedure: PERCUTANEOUS CORONARY INTERVENTION-BALLOON ONLY;  Surgeon: Jettie Booze, MD;  Location: [facility] CATH LAB;  Service: Cardiovascular;;   ROTATOR CUFF REPAIR Right 07/2007   Dr. Morene Rankins CUFF REPAIR Left 11/2004   stent in left leg, behind knee  06/27/2017   x2, done at [facility] cardiology in [place] Right 08/2008   3rd toe, Dr. Sharol Given due to osteomyelitis   VAGINAL  HYSTERECTOMY  03/1975   VEIN LIGATION Bilateral 03/1966   VENA CAVA FILTER PLACEMENT  01/2010   green filter; "due to blood clots"   WISDOM TOOTH EXTRACTION  06/2007     OB History   No obstetric history on file.     Family History  Problem Relation Age of Onset   Heart disease Mother        mitral valve replaced   Arthritis Mother    Diabetic kidney disease Daughter    Diabetes Father    Stroke Father    Migraines Sister    Hypertension Other    Breast cancer Other    Colon cancer Neg Hx    Esophageal cancer Neg Hx    Stomach cancer Neg Hx    Rectal cancer Neg Hx     Social History   Tobacco Use   Smoking status: Never   Smokeless tobacco: Never   Tobacco comments:    tried for a few months in college.   Vaping Use   Vaping Use: Never used  Substance Use Topics   Alcohol use: No    Alcohol/week: 0.0 standard drinks   Drug use: No    Home Medications Prior to Admission medications   Medication Sig Start Date End Date Taking? Authorizing Provider  diclofenac Sodium (VOLTAREN) 1 % GEL Apply 4 g topically 4 (four) times daily. 09/02/21  Yes Deno Etienne, DO  Alpha-Lipoic Acid 600 MG CAPS Take 1,200 mg by mouth daily.    [provider]  amitriptyline (ELAVIL) 25 MG tablet TAKE 1 TABLET(25 MG) BY MOUTH AT BEDTIME 07/18/21   Colon Branch, MD  Ascorbic Acid (VITAMIN C WITH ROSE HIPS) 500 MG tablet Take 500 mg by mouth in the morning, at noon, and at bedtime.    [provider]  atorvastatin (LIPITOR) 40 MG tablet Take 40 mg by mouth daily. 12/01/18   [provider]  carvedilol (COREG) 12.5 MG tablet Take 12.5 mg by mouth 2 (two) times daily with a meal.    [provider]  Cholecalciferol (VITAMIN D3) 5000 UNITS CAPS Take 5,000 Units by mouth every evening. Reported on 11/24/2015    [provider]  clopidogrel (PLAVIX) 75 MG tablet Take 1 tablet (75 mg total) by mouth at bedtime. Restart on 12/04/2020 11/30/20   Viona Gilmore D, NP   Cyanocobalamin (VITAMIN B-12) 500 MCG SUBL Place 5,000  mcg under the tongue daily.    [provider]  dicyclomine (BENTYL) 10 MG capsule TAKE 1 CAPSULE THREE TIMES A DAY BEFORE MEALS (NEED APPOINTMENT) 06/03/21   Mauri Pole, MD  donepezil (ARICEPT) 5 MG tablet Take 1 tablet (5 mg total) by mouth at bedtime. 07/27/21   Colon Branch, MD  famotidine (PEPCID) 20 MG tablet Take 1 tablet (20 mg total) by mouth 2 (two) times daily. 11/18/20   Colon Branch, MD  ferrous sulfate (FEROSUL) 325 (65 FE) MG tablet Take 1 tablet (325 mg total) by mouth 2 (two) times daily with a meal. 12/08/20   Colon Branch, MD  furosemide (LASIX) 20 MG tablet Take 20 mg by mouth daily. 11/09/16   [provider]  Galcanezumab-gnlm (EMGALITY) 120 MG/ML SOAJ Inject 120 mg into the skin every 30 (thirty) days. 08/17/21   Melvenia Beam, MD  glucose blood (FREESTYLE LITE) test strip USE TO CHECK BLOOD SUGAR NO MORE THAN TWICE DAILY 12/04/19   Colon Branch, MD  HYDROcodone-acetaminophen [facility]) 10-325 MG tablet Take 1 tablet by mouth every 6 (six) hours as needed. 07/18/21   Copland, Gay Filler, MD  hydrocortisone (ANUSOL-HC) 2.5 % rectal cream Place 1 application rectally 2 (two) times daily. As needed 03/10/21   Mauri Pole, MD  hydrocortisone (ANUSOL-HC) 25 MG suppository Place 1 suppository (25 mg total) rectally 2 (two) times daily. As needed 03/10/21   Mauri Pole, MD  hydrocortisone 2.5 % cream Apply topically 2 (two) times daily. 06/23/21 06/23/22  Mauri Pole, MD  Lancets (FREESTYLE) lancets CHECK BLOOD SUGAR TWICE DAILY 04/08/19   Colon Branch, MD  loperamide (IMODIUM A-D) 2 MG tablet Take 2 mg by mouth as needed for diarrhea or loose stools.    [provider]  lubiprostone ([name]) 24 MCG capsule Take 1 capsule (24 mcg total) by mouth 2 (two) times daily with a meal. 11/26/19   Nandigam, Venia Minks, MD  MegaRed Omega-3 Krill Oil 500 MG CAPS Restart on 12/04/2020 11/30/20   Viona Gilmore D, NP  methenamine (HIPREX) 1 g tablet Take 0.5 tablets by mouth daily. 07/30/18   [provider]  Multiple Vitamins-Minerals (PRESERVISION AREDS 2) CAPS Take 1 capsule by mouth 2 (two) times daily.    [provider]  naloxone [facility]) nasal spray 4 mg/0.1 mL Place 1 spray into the nose once as needed for up to 1 dose. 10/06/20   Colon Branch, MD  nitroGLYCERIN (NITROSTAT) 0.4 MG SL tablet Place 1 tablet (0.4 mg total) under the tongue every 5 (five) minutes x 3 doses as needed for chest pain. Patient not taking: Reported on 09/02/2021 12/24/19   Colon Branch, MD  omeprazole (PRILOSEC) 20 MG capsule Take 1 capsule (20 mg total) by mouth 2 (two) times daily before a meal. 01/10/21   Nandigam, Venia Minks, MD  ondansetron (ZOFRAN) 4 MG tablet TAKE 1 TABLET(4 MG) BY MOUTH EVERY 8 HOURS AS NEEDED FOR NAUSEA OR VOMITING 09/01/21   Colon Branch, MD  OVER THE COUNTER MEDICATION Take 1 capsule by mouth 2 (two) times daily as needed (digestive issues). Integrative Digestive Formula    [provider]  OVER THE COUNTER MEDICATION 1 tablet 3 (three) times daily. Berberorine Gluco Defense    [provider]  Polyvinyl Alcohol-Povidone (REFRESH OP) Place 1 drop into both eyes 3 (three) times daily as needed (dry eyes).     [provider]  pramipexole (  MIRAPEX) 0.125 MG tablet TAKE 1 TO 2 TABLETS(0.125 TO 0.25 MG) BY MOUTH AT BEDTIME AS NEEDED 06/13/21   Colon Branch, MD  rivaroxaban (XARELTO) 2.5 MG TABS tablet Take 1 tablet (2.5 mg total) by mouth 2 (two) times daily. Restart on 12/04/2020 11/30/20   Viona Gilmore D, NP  traMADol (ULTRAM) 50 MG tablet Take 50 mg by mouth in the morning and at bedtime. 07/28/21   [provider]  vitamin E 180 MG (400 UNITS) capsule Take 400 Units by mouth daily.    [provider]    Allergies    Cymbalta [duloxetine hcl], Gabapentin, Silicone, Metformin and related, Adhesive [tape], Cefazolin, Ciprofloxacin,  Clorazepate dipotassium, Dilaudid [hydromorphone hcl], Doxycycline, Enablex [darifenacin hydrobromide er], Levofloxacin, Lyrica [pregabalin], Methadone hcl, Morphine and related, and Talwin [pentazocine]  Review of Systems   Review of Systems  Constitutional:  Negative for chills and fever.  HENT:  Negative for congestion and rhinorrhea.   Eyes:  Negative for redness and visual disturbance.  Respiratory:  Negative for shortness of breath and wheezing.   Cardiovascular:  Positive for chest pain. Negative for palpitations.  Gastrointestinal:  Negative for nausea and vomiting.  Genitourinary:  Negative for dysuria and urgency.  Musculoskeletal:  Negative for arthralgias and myalgias.  Skin:  Negative for pallor and wound.  Neurological:  Negative for dizziness and headaches.   Physical Exam Updated Vital Signs BP 128/68   Pulse 76   Temp 98.5 F (36.9 C) (Oral)   Resp 17   Ht 5' (1.524 m)   Wt 70.8 kg   SpO2 100%   BMI 30.48 kg/m   Physical Exam Vitals and nursing note reviewed.  Constitutional:      General: She is not in acute distress.    Appearance: She is well-developed. She is not diaphoretic.  HENT:     Head: Normocephalic and atraumatic.  Eyes:     Pupils: Pupils are equal, round, and reactive to light.  Cardiovascular:     Rate and Rhythm: Normal rate and regular rhythm.     Heart sounds: No murmur heard.   No friction rub. No gallop.  Pulmonary:     Effort: Pulmonary effort is normal.     Breath sounds: No wheezing or rales.  Abdominal:     General: There is no distension.     Palpations: Abdomen is soft.     Tenderness: There is no abdominal tenderness.  Musculoskeletal:        General: Swelling and tenderness present.     Cervical back: Normal range of motion and neck supple.     Comments: Pain to the attachments of the latissimus dorsi to the chest wall.  Able to range the shoulder without difficulty.  Skin:    General: Skin is warm and dry.   Neurological:     Mental Status: She is alert and oriented to person, place, and time.  Psychiatric:        Behavior: Behavior normal.    ED Results / Procedures / Treatments   Labs (all labs ordered are listed, but only abnormal results are displayed) Labs Reviewed  CBC WITH DIFFERENTIAL/PLATELET - Abnormal; Notable for the following components:      Result Value   RBC 3.62 (*)    Hemoglobin 11.3 (*)    HCT 35.2 (*)    Platelets 131 (*)    All other components within normal limits  BASIC METABOLIC PANEL - Abnormal; Notable for the following components:  Calcium 7.6 (*)    GFR, Estimated 58 (*)    All other components within normal limits  TROPONIN I (HIGH SENSITIVITY)    EKG EKG Interpretation  Date/Time:  Friday September 02 2021 16:30:24 EST Ventricular Rate:  115 PR Interval:    QRS Duration: 76 QT Interval:  348 QTC Calculation: 481 R Axis:   13 Text Interpretation: Accelerated Junctional rhythm Low voltage QRS Cannot rule out Anterior infarct , age undetermined Abnormal ECG No significant change since last tracing Confirmed by Deno Etienne [phone]) on 09/02/2021 5:54:51 PM  Radiology DG Chest Port 1 View  Result Date: 09/02/2021 CLINICAL DATA:  Chest pain EXAM: PORTABLE CHEST 1 VIEW COMPARISON:  CT chest 10/17/2017, chest x-ray 09/22/2020 FINDINGS: The heart and mediastinal contours are within normal limits. Cardiac surgical changes overlie the mediastinum. No focal consolidation. Chronic coarsened interstitial markings with no overt pulmonary edema. No pleural effusion. No pneumothorax. No acute osseous abnormality. IMPRESSION: No active disease. Electronically Signed   By: Iven Finn M.D.   On: 09/02/2021 17:12    Procedures Procedures   Medications Ordered in ED Medications - No data to display  ED Course  I have reviewed the triage vital signs and the nursing notes.  Pertinent labs & imaging results that were available during my care of the patient  were reviewed by me and considered in my medical decision making (see chart for details).    MDM Rules/Calculators/A&P                           86 yo F with a cc of L sided chest pain.  Pain on palpation.  Likely muscular based on history and exam.  Will obtain chest x-ray blood work reassess.  Chest x-ray viewed by me without focal infiltrate tachycardia resolved spontaneously without intervention.  No significant anemia no significant electrolyte abnormality.  Troponin negative.  Discharged home.  6:41 PM:  I have discussed the diagnosis/risks/treatment options with the patient and family and believe the pt to be eligible for discharge home to follow-up with PCP. We also discussed returning to the ED immediately if new or worsening sx occur. We discussed the sx which are most concerning (e.g., sudden worsening pain, fever, inability to tolerate by mouth) that necessitate immediate return. Medications administered to the patient during their visit and any new prescriptions provided to the patient are listed below.  Medications given during this visit Medications - No data to display   The patient appears reasonably screen and/or stabilized for discharge and I doubt any other medical condition or other [facility] requiring further screening, evaluation, or treatment in the ED at this time prior to discharge.   Final Clinical Impression(s) / ED Diagnoses Final diagnoses:  Muscle strain    Rx / DC Orders ED Discharge Orders          Ordered    diclofenac Sodium (VOLTAREN) 1 % GEL  4 times daily        09/02/21 1810             Deno Etienne, DO 09/02/21 1841

## 2021-09-02 NOTE — Discharge Instructions (Signed)
Use your gel as prescribed. Also take tylenol 1000mg (2 extra strength) four times a day.

## 2021-09-02 NOTE — Telephone Encounter (Signed)
Pt is scheduled to see Eugenia Pancoast, NP on 09/02/2021.   [facility] Day - Client TELEPHONE ADVICE RECORD AccessNurse Patient Name:[name] Initial Comment Caller states three days ago, she woke up in the middle of the night with her left arm hurting. She states it is mainly underneath her arm and radiating toward elbow. She states the pain is excruciating. It comes and goes but if she starts moving it or using it, it starts hurting. Translation No Nurse Assessment Nurse: Hardin Negus, RN, Mardene Celeste Date/Time Eilene Ghazi Time): 09/01/2021 4:15:37 PM Confirm and document reason for call. If symptomatic, describe symptoms. ---She three days ago, she woke up in the middle of the night with her left arm hurting. She states it is mainly underneath her arm and radiating toward elbow. She states the pain is excruciating. It comes and goes with movement or using it. She has not had a fall. She has taken pain meds and it is not helping. No swelling Does the patient have any new or worsening symptoms? ---Yes Will a triage be completed? ---Yes Related visit to physician within the last 2 weeks? ---No Does the PT have any chronic conditions? (i.e. diabetes, asthma, this includes High risk factors for pregnancy, etc.) ---Yes List chronic conditions. ---pain movement, hypertension, diabetes, Is this a behavioral health or substance abuse call? ---No Guidelines Guideline Title Affirmed Question Affirmed Notes Nurse Date/Time (Eastern Time) Arm Pain [1] Age > 40 AND [2] no obvious cause AND [3] pain even when not moving the [place], Annalee Genta 09/01/2021 4:20:43 PM PLEASE NOTE: All timestamps contained within this report are represented as [place] Standard Time. CONFIDENTIALTY NOTICE: This fax transmission is intended only for the addressee. It contains information that is legally privileged, confidential or otherwise protected from use or disclosure. If you are not the  intended recipient, you are strictly prohibited from reviewing, disclosing, copying using or disseminating any of this information or taking any action in reliance on or regarding this information. If you have received this fax in error, please notify us immediately by telephone so that we can arrange for its return to [place]. Phone: [phone], Toll-Free: [phone], Fax: [phone] Page: 2 of 2 Call Id: [id number] Guidelines Guideline Title Affirmed Question Affirmed Notes Nurse Date/Time Eilene Ghazi Time) arm (Exception: pain is clearly made worse by moving arm or bending neck) Disp. Time Eilene Ghazi Time) Disposition Final User 09/01/2021 4:23:29 PM Go to ED Now Yes Hardin Negus, RN, [place] Disagree/Comply Disagree Caller Understands Yes PreDisposition Call Doctor Care Advice Given Per Guideline GO TO ED NOW: CARE ADVICE given per Arm Pain (Adult) guideline. Comments User: Ledora Bottcher, RN Date/Time Eilene Ghazi Time): 09/01/2021 4:25:31 PM She has a appointment scheduled 09/02/2021 @ 3pm Referrals GO TO FACILITY REFUSED

## 2021-09-02 NOTE — ED Triage Notes (Signed)
Pain to her left upper arm and axilla x 4 days. Pain comes and goes. Denies chest pain. She was sent here from her MD's office. EKG done at triage. She goes to a pain clinic for generalized pain. She was taken off an opiate and started on Tramadol recently.

## 2021-09-02 NOTE — Patient Instructions (Signed)
  Due to high heart rate ,I am sending you to the emergency room.

## 2021-09-02 NOTE — ED Notes (Signed)
D/c paperwork reviewed with pt and pts son, including prescriptions. No questions or concerns at time of d/c. Pts son assisted pt back into personal wheelchair and pushed her to ED exit.

## 2021-09-02 NOTE — Progress Notes (Signed)
Subjective:     Patient ID: [name], female    DOB: January 02, 1935, 86 y.o.   MRN: [id number]  Chief Complaint  Patient presents with   pain under left arm     Starts under the shoulder and goes around it. Started 4 days ago   Diarrhea    5 months- black runny stool.     HPI Patient is in today for concerns of left sided arm pain. The arm pain started under her left shoulder, and woke her up from sleep and radiated down her left arm two nights ago. The pain is intermittent since.  She states the pain has eased off as of now, but she does still feel it with spot tenderness on her left upper back. Not aggravated by movement. She does see her cardiologist regularly, but her son states she does not have any nitroglycerin at home as she needs a refill, has f/u with cardiologist in December.  Up until two days ago pt has had diarrhea, that she states 'flows out of her body'. She does state occasionally it has a darker brown appearance, occasionally black per patient but not in the last few days. Has not had diarrhea today, but had a mild amount yesterday 2-3 episodes. Soft unformed stool. She does occasionally take immodium but not recently. Pt reports no blood, but she does state she has had it in the past due a hemorrhoid.  Health Maintenance Due  Topic Date Due   COVID-19 Vaccine (4 - Booster for Pfizer series) 12/27/2020   OPHTHALMOLOGY EXAM  08/20/2021    Past Medical History:  Diagnosis Date   Allergic rhinitis    Arthritis    "back; ankles; hands; knees" (10/31/2013)   Bilateral sensorineural hearing loss    CAD (coronary artery disease)    a. Nonobst in 2011. b. Abnormal nuc 09/2013 -> s/p cutting balloon to D2, mild LAD disease; c. 08/2015 MV: no ischemia, EF 71%.   Carcinoma in situ in a polyp 1994   a. 1994 - malignant polyp removed during colonoscopy.   Chronic diastolic CHF (congestive heart failure) ([place])    a. 09/2013 Echo: EF 60-65%, mild LVH, Gr1 DD.   Dementia ([place])     Diverticulosis    DJD (degenerative joint disease)    Fatty liver    GERD (gastroesophageal reflux disease)    a. Hx GERD/esophageal dysmotility followed by Dr. Olevia Perches.    Hemorrhoids    Hiatal hernia    a. s/p Nissen fundoplication 9024.   Hyperlipidemia    a. patient unwilling to use statins.   Hypertensive heart disease    IBS (irritable bowel syndrome)    Macular degeneration 03/2009   Dr. Rosana Hoes   Neuropathy    a. Hands, feet, legs.   Orthostatic hypotension    Osteomyelitis ([facility])    a. 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 01/2016 s/p RLE transtibial amputation 2/2 Charcot rocker-bottom deformity and insensate neuropathy ulceration.   Osteoporosis    PE (pulmonary embolism) 12/2007   a. PE/DVT after neck surgery 2009. b. coumadin d/c 10-2008.   PONV (postoperative nausea and vomiting) 2009   neck surgery   PPD positive    Recurrent UTI    RSD (reflex sympathetic dystrophy)    a. Chronic pain.   Spinal stenosis    Type II diabetes mellitus ([place])    no on medication   Venous insufficiency    a. Contributing  to LEE.    Past Surgical History:  Procedure Laterality Date   AMPUTATION  03/06/2012   Procedure: AMPUTATION FOOT;  Surgeon: Newt Minion, MD;  Location: [facility];  Service: Orthopedics;  Laterality: Left;  FIFTH RAY AMPUTATION    AMPUTATION Right 02/18/2016   Procedure: AMPUTATION BELOW KNEE;  Surgeon: Newt Minion, MD;  Location: [place];  Service: Orthopedics;  Laterality: Right;   AMPUTATION Left 11/22/2016   Procedure: Amputation 4th Toe Left Foot at Metatarsophalangeal Joint;  Surgeon: Newt Minion, MD;  Location: [place];  Service: Orthopedics;  Laterality: Left;   ANKLE FUSION  09/27/2012   Procedure: ANKLE FUSION;  Surgeon: Newt Minion, MD;  Location: [facility];  Service: Orthopedics;  Laterality: Left;  Left Tibiocalcaneal Fusion   ANKLE FUSION Right 05/09/2013   Procedure: ANKLE FUSION;  Surgeon: Newt Minion, MD;  Location: [facility];  Service: Orthopedics;  Laterality: Right;  Excision Osteomyelitis Base 1st MT Right Foot, Fusion Medial Column   ANTERIOR CERVICAL DECOMP/DISCECTOMY FUSION  12/18/07   For OA,  Dr. Lorin Mercy:  fu by a PE   ANTERIOR CERVICAL DECOMP/DISCECTOMY FUSION N/A 11/29/2020   Procedure: Anterior Cervical Decompression Fusion - Cervical Three-Cerivcal Four;  Surgeon: Earnie Larsson, MD;  Location: [facility];  Service: Neurosurgery;  Laterality: N/A;  3C   CARDIAC CATHETERIZATION  05/2010    at [facility] Left 09/2011   CATARACT EXTRACTION W/ INTRAOCULAR LENS  IMPLANT, BILATERAL  2004   feb 2004 left, aug 2004 right   CHOLECYSTECTOMY  03/2002   CORONARY ANGIOPLASTY  10/31/2013   CORONARY ARTERY BYPASS GRAFT  09/27/2016   CYST REMOVAL HAND  06/2003   FOOT BONE EXCISION Right 06/2009   LAPAROSCOPIC RIGHT HEMI COLECTOMY N/A 01/08/2015   Procedure: LAPAROSCOPIC ASSISTED RIGHT HEMI COLECTOMY;  Surgeon: Johnathan Hausen, MD;  Location: [facility];  Service: General;  Laterality: N/A;   LEFT HEART CATHETERIZATION WITH CORONARY ANGIOGRAM N/A 10/31/2013   Procedure: LEFT HEART CATHETERIZATION WITH CORONARY ANGIOGRAM;  Surgeon: Jettie Booze, MD;  Location: [facility] CATH LAB;  Service: Cardiovascular;  Laterality: N/A;   NASAL SEPTUM SURGERY  60/7371   NISSEN FUNDOPLICATION  0/03/2693   PERCUTANEOUS CORONARY INTERVENTION-BALLOON ONLY  10/31/2013   Procedure: PERCUTANEOUS CORONARY INTERVENTION-BALLOON ONLY;  Surgeon: Jettie Booze, MD;  Location: [facility] C[facility] LAB;  Service: Cardiovascular;;   ROTATOR CUFF REPAIR Right 07/2007   Dr. Morene Rankins CUFF REPAIR Left 11/2004   stent in left leg, behind knee  06/27/2017   x2, done at [facility] in [facility]   TOE AMPUTATION Right 08/2008   3rd toe, Dr. Sharol Given due to osteomyelitis   VAGINAL HYSTERECTOMY  03/1975   VEIN LIGATION Bilateral 03/1966   VENA CAVA FILTER PLACEMENT  01/2010   green filter; "due to blood clots"   WISDOM  TOOTH EXTRACTION  06/2007    Family History  Problem Relation Age of Onset   Heart disease Mother        mitral valve replaced   Arthritis Mother    Diabetic kidney disease Daughter    Diabetes Father    Stroke Father    Migraines Sister    Hypertension Other    Breast cancer Other    Colon cancer Neg Hx    Esophageal cancer Neg Hx    Stomach cancer Neg Hx    Rectal cancer Neg Hx     Social History   Socioeconomic History  Marital status: Divorced    Spouse name: Not on file   Number of children: 3   Years of education: 15   Highest education level: Not on file  Occupational History   Occupation: Retired     Comment: from social services   Tobacco Use   Smoking status: Never   Smokeless tobacco: Never   Tobacco comments:    tried for a few months in college.   Vaping Use   Vaping Use: Never used  Substance and Sexual Activity   Alcohol use: No    Alcohol/week: 0.0 standard drinks   Drug use: No   Sexual activity: Not Currently    Birth control/protection: Post-menopausal  Other Topics Concern   Not on file  Social History Narrative   Daughter [name] deceased September 13, 2020   daughter lives in town   1 son in [place], [place] Cuff comes home every 4 weeks to visit   Caffeine use:  Tea 1/day w/ dinner   Coffee occass          Currently lives in [place] with son Signe Tackitt.   Social Determinants of Health   Financial Resource Strain: Low Risk    Difficulty of Paying Living Expenses: Not hard at all  Food Insecurity: No Food Insecurity   Worried About Charity fundraiser in the Last Year: Never true   [place] in the Last Year: Never true  Transportation Needs: No Transportation Needs   Lack of Transportation (Medical): No   Lack of Transportation (Non-Medical): No  Physical Activity: Inactive   Days of Exercise per Week: 0 days   Minutes of Exercise per Session: 0 min  Stress: No Stress Concern Present   Feeling of Stress : Not at all  Social  Connections: Socially Isolated   Frequency of Communication with Friends and Family: More than three times a week   Frequency of Social Gatherings with Friends and Family: Once a week   Attends Religious Services: Never   Marine scientist or Organizations: No   Attends Archivist Meetings: Never   Marital Status: Widowed  Human resources officer Violence: Not At Risk   Fear of Current or Ex-Partner: No   Emotionally Abused: No   Physically Abused: No   Sexually Abused: No    Outpatient Medications Prior to Visit  Medication Sig Dispense Refill   Alpha-Lipoic Acid 600 MG CAPS Take 1,200 mg by mouth daily.     amitriptyline (ELAVIL) 25 MG tablet TAKE 1 TABLET(25 MG) BY MOUTH AT BEDTIME 90 tablet 1   Ascorbic Acid (VITAMIN C WITH ROSE HIPS) 500 MG tablet Take 500 mg by mouth in the morning, at noon, and at bedtime.     atorvastatin (LIPITOR) 40 MG tablet Take 40 mg by mouth daily.     carvedilol (COREG) 12.5 MG tablet Take 12.5 mg by mouth 2 (two) times daily with a meal.     Cholecalciferol (VITAMIN D3) 5000 UNITS CAPS Take 5,000 Units by mouth every evening. Reported on 11/24/2015     clopidogrel (PLAVIX) 75 MG tablet Take 1 tablet (75 mg total) by mouth at bedtime. Restart on 12/04/2020     Cyanocobalamin (VITAMIN B-12) 500 MCG SUBL Place 5,000 mcg under the tongue daily.     dicyclomine (BENTYL) 10 MG capsule TAKE 1 CAPSULE THREE TIMES A DAY BEFORE MEALS (NEED APPOINTMENT) 360 capsule 0   donepezil (ARICEPT) 5 MG tablet Take 1 tablet (5 mg total) by mouth  at bedtime. 90 tablet 1   famotidine (PEPCID) 20 MG tablet Take 1 tablet (20 mg total) by mouth 2 (two) times daily. 180 tablet 3   ferrous sulfate (FEROSUL) 325 (65 FE) MG tablet Take 1 tablet (325 mg total) by mouth 2 (two) times daily with a meal. 180 tablet 3   furosemide (LASIX) 20 MG tablet Take 20 mg by mouth daily.     Galcanezumab-gnlm (EMGALITY) 120 MG/ML SOAJ Inject 120 mg into the skin every 30 (thirty) days. 1.12 mL  11   glucose blood (FREESTYLE LITE) test strip USE TO CHECK BLOOD SUGAR NO MORE THAN TWICE DAILY 200 each 12   HYDROcodone-acetaminophen (NORCO) 10-325 MG tablet Take 1 tablet by mouth every 6 (six) hours as needed. 120 tablet 0   hydrocortisone (ANUSOL-HC) 2.5 % rectal cream Place 1 application rectally 2 (two) times daily. As needed 30 g 3   hydrocortisone (ANUSOL-HC) 25 MG suppository Place 1 suppository (25 mg total) rectally 2 (two) times daily. As needed 12 suppository 5   hydrocortisone 2.5 % cream Apply topically 2 (two) times daily. 30 g 1   Lancets (FREESTYLE) lancets CHECK BLOOD SUGAR TWICE DAILY 200 each 12   loperamide (IMODIUM A-D) 2 MG tablet Take 2 mg by mouth as needed for diarrhea or loose stools.     lubiprostone ([name]) 24 MCG capsule Take 1 capsule (24 mcg total) by mouth 2 (two) times daily with a meal. 60 capsule 2   MegaRed Omega-3 Krill Oil 500 MG CAPS Restart on 12/04/2020     methenamine (HIPREX) 1 g tablet Take 0.5 tablets by mouth daily.  4   Multiple Vitamins-Minerals (PRESERVISION AREDS 2) CAPS Take 1 capsule by mouth 2 (two) times daily.     naloxone (NARCAN) nasal spray 4 mg/0.1 mL Place 1 spray into the nose once as needed for up to 1 dose. 2 each 0   omeprazole (PRILOSEC) 20 MG capsule Take 1 capsule (20 mg total) by mouth 2 (two) times daily before a meal. 180 capsule 3   ondansetron (ZOFRAN) 4 MG tablet TAKE 1 TABLET(4 MG) BY MOUTH EVERY 8 HOURS AS NEEDED FOR NAUSEA OR VOMITING 40 tablet 1   OVER THE COUNTER MEDICATION Take 1 capsule by mouth 2 (two) times daily as needed (digestive issues). Integrative Digestive Formula     OVER THE COUNTER MEDICATION 1 tablet 3 (three) times daily. Berberorine Gluco Defense     pramipexole (MIRAPEX) 0.125 MG tablet TAKE 1 TO 2 TABLETS(0.125 TO 0.25 MG) BY MOUTH AT BEDTIME AS NEEDED 180 tablet 1   rivaroxaban (XARELTO) 2.5 MG TABS tablet Take 1 tablet (2.5 mg total) by mouth 2 (two) times daily. Restart on 12/04/2020 60 tablet     traMADol (ULTRAM) 50 MG tablet Take 50 mg by mouth in the morning and at bedtime.     vitamin E 180 MG (400 UNITS) capsule Take 400 Units by mouth daily.     diclofenac Sodium (VOLTAREN) 1 % GEL Apply 2-4 g topically 4 (four) times daily as needed. (Patient taking differently: Apply 2-4 g topically 4 (four) times daily as needed (joint pain).) 100 g 5   nitroGLYCERIN (NITROSTAT) 0.4 MG SL tablet Place 1 tablet (0.4 mg total) under the tongue every 5 (five) minutes x 3 doses as needed for chest pain. (Patient not taking: Reported on 09/02/2021) 25 tablet 3   Polyvinyl Alcohol-Povidone (REFRESH OP) Place 1 drop into both eyes 3 (three) times daily as needed (dry eyes).  No facility-administered medications prior to visit.    Allergies  Allergen Reactions   Cymbalta [Duloxetine Hcl] Swelling    Swelling in legs   Gabapentin Swelling    Swelling in legs Swelling in legs   Silicone Hives, Itching, Dermatitis and Rash   Metformin And Related Other (See Comments)    dizzy, tired, chills, diarrhea, and nausea   Adhesive [Tape] Rash    rash rash   Cefazolin Hives    Hives Hives    Ciprofloxacin Other (See Comments)    Other reaction(s): Other (See Comments) Per pt, caused body aches Per pt, caused body aches    Clorazepate Dipotassium Other (See Comments)    Unknown reaction   Dilaudid [Hydromorphone Hcl] Other (See Comments)     confused, intense itching   Doxycycline Rash   Enablex [Darifenacin Hydrobromide Er] Other (See Comments)    Hypotension, near syncope   Levofloxacin Other (See Comments)    Causes wrist pain   Lyrica [Pregabalin] Swelling    Swelling in legs   Methadone Hcl Other (See Comments)    Reaction unknown   Morphine And Related Other (See Comments)    Confusion, constipation.    Talwin [Pentazocine] Other (See Comments)    "climbing walls" anxiety    Review of Systems  Constitutional:  Negative for fever.  Respiratory:  Negative for cough and  shortness of breath.   Cardiovascular:  Positive for chest pain (radiating under left lower breast to left lower side). Negative for palpitations and leg swelling.  Gastrointestinal:  Positive for diarrhea (none today however 2-3 episodes yesterday) and melena (two days ago, one epsisode but none since. stool was with black appearance. not tarry). Negative for blood in stool and constipation.      Objective:    Physical Exam Constitutional:      Appearance: Normal appearance.     Comments: Arrived in a wheelchair, accompanied by son  Cardiovascular:     Rate and Rhythm: Regular rhythm. Tachycardia present.     Heart sounds: No murmur heard. Pulmonary:     Effort: Pulmonary effort is normal.     Breath sounds: Normal breath sounds.  Musculoskeletal:       Arms:  Skin:    General: Skin is warm.  Neurological:     Mental Status: She is alert.    BP 96/60   Pulse (!) 112   Temp 98.8 F (37.1 C) (Oral)   Ht 5' (1.524 m)   Wt 156 lb (70.8 kg)   SpO2 99%   BMI 30.47 kg/m  Wt Readings from Last 3 Encounters:  09/02/21 156 lb 1.4 oz (70.8 kg)  09/02/21 156 lb (70.8 kg)  08/17/21 150 lb (68 kg)       Assessment & Plan:   Problem List Items Addressed This Visit       Other   Tachycardia - Primary    Patient with tachycardia and extensive heart history. EKg reviewed, NSR tachycardia, rate 117 bpm. Manual rate 115 bpm. Pt advised to go to ER, escorted downstairs via wheelchair by her son as well as our Psychologist, sport and exercise. Pt stable upon leaving. Notified ER triage of her arrival. Pt to f/u with cardiologist upon discharge from ER.      Relevant Orders   EKG 12-Lead (Completed)   Diarrhea    I am having Nomi C. Parkin maintain her Alpha-Lipoic Acid, Vitamin D3, OVER THE COUNTER MEDICATION, Polyvinyl Alcohol-Povidone (REFRESH OP), PreserVision AREDS 2, OVER THE COUNTER MEDICATION,  carvedilol, furosemide, Vitamin B-12, methenamine, atorvastatin, freestyle, lubiprostone,  FREESTYLE LITE, nitroGLYCERIN, naloxone, famotidine, vitamin C with rose hips, vitamin E, loperamide, clopidogrel, MegaRed Omega-3 Krill Oil, rivaroxaban, ferrous sulfate, omeprazole, hydrocortisone, hydrocortisone, dicyclomine, pramipexole, hydrocortisone, amitriptyline, HYDROcodone-acetaminophen, donepezil, traMADol, Emgality, and ondansetron.  No orders of the defined types were placed in this encounter.

## 2021-09-02 NOTE — Telephone Encounter (Signed)
Appt with Tabitha today at 3pm.

## 2021-09-05 NOTE — Assessment & Plan Note (Signed)
Patient with tachycardia and extensive heart history. EKg reviewed, NSR tachycardia, rate 117 bpm. Manual rate 115 bpm. Pt advised to go to ER, escorted downstairs via wheelchair by her son as well as our Psychologist, sport and exercise. Pt stable upon leaving. Notified ER triage of her arrival. Pt to f/u with cardiologist upon discharge from ER.

## 2021-09-13 ENCOUNTER — Telehealth: Payer: Self-pay

## 2021-09-13 NOTE — Telephone Encounter (Signed)
Orders signed for Prosthetic and [facility] and faxed back to [phone]. Form sent for scanning.

## 2021-09-19 DIAGNOSIS — R8271 Bacteriuria: Secondary | ICD-10-CM | POA: Diagnosis not present

## 2021-09-19 DIAGNOSIS — N3020 Other chronic cystitis without hematuria: Secondary | ICD-10-CM | POA: Diagnosis not present

## 2021-09-20 ENCOUNTER — Other Ambulatory Visit: Payer: Self-pay | Admitting: Gastroenterology

## 2021-09-20 DIAGNOSIS — M5126 Other intervertebral disc displacement, lumbar region: Secondary | ICD-10-CM | POA: Diagnosis not present

## 2021-09-20 DIAGNOSIS — Z6827 Body mass index (BMI) 27.0-27.9, adult: Secondary | ICD-10-CM | POA: Diagnosis not present

## 2021-09-28 DIAGNOSIS — H40013 Open angle with borderline findings, low risk, bilateral: Secondary | ICD-10-CM | POA: Diagnosis not present

## 2021-09-28 DIAGNOSIS — H04123 Dry eye syndrome of bilateral lacrimal glands: Secondary | ICD-10-CM | POA: Diagnosis not present

## 2021-09-28 DIAGNOSIS — H40053 Ocular hypertension, bilateral: Secondary | ICD-10-CM | POA: Diagnosis not present

## 2021-09-28 DIAGNOSIS — Z961 Presence of intraocular lens: Secondary | ICD-10-CM | POA: Diagnosis not present

## 2021-09-28 LAB — HM DIABETES EYE EXAM

## 2021-09-29 DIAGNOSIS — M5126 Other intervertebral disc displacement, lumbar region: Secondary | ICD-10-CM | POA: Diagnosis not present

## 2021-09-29 DIAGNOSIS — M4802 Spinal stenosis, cervical region: Secondary | ICD-10-CM | POA: Diagnosis not present

## 2021-09-29 DIAGNOSIS — M542 Cervicalgia: Secondary | ICD-10-CM | POA: Diagnosis not present

## 2021-10-10 ENCOUNTER — Encounter: Payer: Self-pay | Admitting: Orthopedic Surgery

## 2021-10-10 ENCOUNTER — Ambulatory Visit (INDEPENDENT_AMBULATORY_CARE_PROVIDER_SITE_OTHER): Payer: Medicare Other | Admitting: Orthopedic Surgery

## 2021-10-10 ENCOUNTER — Ambulatory Visit (INDEPENDENT_AMBULATORY_CARE_PROVIDER_SITE_OTHER): Payer: Medicare Other

## 2021-10-10 ENCOUNTER — Ambulatory Visit: Payer: Self-pay

## 2021-10-10 DIAGNOSIS — G8929 Other chronic pain: Secondary | ICD-10-CM | POA: Diagnosis not present

## 2021-10-10 DIAGNOSIS — M25561 Pain in right knee: Secondary | ICD-10-CM | POA: Diagnosis not present

## 2021-10-10 DIAGNOSIS — M25562 Pain in left knee: Secondary | ICD-10-CM

## 2021-10-10 DIAGNOSIS — S88111A Complete traumatic amputation at level between knee and ankle, right lower leg, initial encounter: Secondary | ICD-10-CM

## 2021-10-10 DIAGNOSIS — Z89511 Acquired absence of right leg below knee: Secondary | ICD-10-CM

## 2021-10-10 NOTE — Progress Notes (Signed)
Office Visit Note   Patient: [name]           Date of Birth: July 26, 1935           MRN: [id number] Visit Date: 10/10/2021              Requested by: Colon Branch, [address] [place],  [place] [address] PCP: Colon Branch, MD  Chief Complaint  Patient presents with   Right Knee - Pain   Left Knee - Pain      HPI: Patient is an 86 year old woman status post right transtibial amputation who has been having increasing pain in both knees right worse than left.  Patient states she has been having some dermatitis from the liner for the right transtibial amputation.  Assessment & Plan: Visit Diagnoses:  1. Chronic pain of both knees     Plan: Recommended using a cortisone cream on the areas where the silicone liner has caused a skin rash proximally.  Patient will follow-up as needed for repeat injections.  Follow-Up Instructions: No follow-ups on file.   Ortho Exam  Patient is alert, oriented, no adenopathy, well-dressed, normal affect, normal respiratory effort. Examination patient has a well-healed right transtibial amputation there is a fungal rash proximal lateral from the liner.  No cellulitis no signs of infection.  Examination of both knees there is crepitation and pain with range of motion of both knees there is no effusion no cellulitis no drainage no signs of infection.  Imaging: XR Knee 1-2 Views Left  Result Date: 10/10/2021 2 view radiographs of the left knee shows mild osteoarthritic changes the joint spaces congruent and narrowed.  XR Knee 1-2 Views Right  Result Date: 10/10/2021 2 view radiographs of the right knee shows tricompartmental arthritic changes with significant degenerative osteoarthritis.  No images are attached to the encounter.  Labs: Lab Results  Component Value Date   HGBA1C 5.6 03/18/2021   HGBA1C 6.1 (H) 12/17/2020   HGBA1C 8.1 (H) 08/27/2020   ESRSEDRATE 9 06/12/2014   ESRSEDRATE 12 05/13/2014   REPTSTATUS  06/04/2021 FINAL 06/02/2021   CULT >=100,000 COLONIES/mL ESCHERICHIA COLI (A) 06/02/2021   LABORGA ESCHERICHIA COLI (A) 06/02/2021     Lab Results  Component Value Date   ALBUMIN 3.5 06/01/2021   ALBUMIN 3.8 09/03/2020   ALBUMIN 4.2 12/24/2019    Lab Results  Component Value Date   MG 1.5 03/30/2016   Lab Results  Component Value Date   VD25OH 65 10/20/2011    No results found for: PREALBUMIN CBC EXTENDED Latest Ref Rng & Units 09/02/2021 06/01/2021 11/25/2020  WBC 4.0 - 10.5 K/uL 4.3 5.0 4.1  RBC 3.87 - 5.11 MIL/uL 3.62(L) 3.86(L) 4.08  HGB 12.0 - 15.0 g/dL 11.3(L) 11.5(L) 12.1  HCT 36.0 - 46.0 % 35.2(L) 37.0 38.0  PLT 150 - 400 K/uL 131(L) 140(L) 172  NEUTROABS 1.7 - 7.7 K/uL 2.8 3.6 2.0  LYMPHSABS 0.7 - 4.0 K/uL 1.2 1.0 1.6     There is no height or weight on file to calculate BMI.  Orders:  Orders Placed This Encounter  Procedures   XR Knee 1-2 Views Right   XR Knee 1-2 Views Left   No orders of the defined types were placed in this encounter.    Procedures: Large Joint Inj: bilateral knee on 10/10/2021 5:34 PM Indications: pain and diagnostic evaluation Details: 22 G 1.5 in needle, anteromedial approach  Arthrogram: No  Outcome: tolerated well, no immediate  complications Procedure, treatment alternatives, risks and benefits explained, specific risks discussed. Consent was given by the patient. Immediately prior to procedure a time out was called to verify the correct patient, procedure, equipment, support staff and site/side marked as required. Patient was prepped and draped in the usual sterile fashion.     Clinical Data: No additional findings.  ROS:  All other systems negative, except as noted in the HPI. Review of Systems  Objective: Vital Signs: There were no vitals taken for this visit.  Specialty Comments:  No specialty comments available.  PMFS History: Patient Active Problem List   Diagnosis Date Noted   Chronic migraine without  aura, with intractable migraine, so stated, with status migrainosus 08/17/2021   Cervical myelopathy (HCC) 11/29/2020   Chronic idiopathic constipation 11/12/2020   Lumbar compression fracture ([place]) 09/29/2020   Other spondylosis with radiculopathy, cervical region 09/29/2020   External hemorrhoids 02/15/2018   History of three vessel coronary artery bypass - 2017 , in [facility] 09/25/2017   PVD (peripheral vascular disease) ([place])-- stent L leg 06-2017 Dr Feliberto Gottron [facility] 07/23/2017   Phantom pain ([place]) 04/01/2017   Constipation, chronic 04/01/2017   S/P unilateral BKA (below knee amputation), right ([place]) 12/14/2016   Toe osteomyelitis, left ([place]) 11/17/2016   Acute on chronic diastolic CHF (congestive heart failure), NYHA class 3 ([place]) 03/30/2016   Coronary artery disease due to lipid rich plaque 03/30/2016   Acute renal failure superimposed on stage 3 chronic kidney disease ([place]) 03/30/2016   S/P below knee amputation ([place]) 02/18/2016   Memory loss 11/26/2015   PCP NOTES >>>>>>>>>>>>>>>>>>>> 10/18/2015   S/P partial colectomy 01/08/2015   Diarrhea 01/29/2014   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 01/28/2014   Anemia 01/18/2014   CAD (coronary artery disease)    Carcinoma in situ in a polyp    Depression 09/17/2013   Charcot's joint 08/21/2013   Foot osteomyelitis, right ([place]) 07/16/2013   Chronic pain associated with significant psychosocial dysfunction 05/06/2013   Neuropathy    Hyperthyroidism    Lower extremity edema 03/08/2011   Annual physical exam 03/08/2011   Tachycardia 12/07/2010   ALLERGIC RHINITIS 04/15/2010   ORTHOSTATIC DIZZINESS 04/13/2010   HIATAL HERNIA 12/06/2009   GASTRIC ULCER, HX OF 12/06/2009   DYSPNEA 03/18/2009   DEGENERATIVE JOINT DISEASE 06/03/2008   COLONIC POLYPS 01/27/2008   DIVERTICULOSIS, COLON 01/27/2008   IRRITABLE BOWEL SYNDROME, HX OF 01/27/2008   Pulmonary nodule, right 01/22/2008   Hyperlipidemia 12/30/2007   Osteoporosis  12/30/2007   TB SKIN TEST, POSITIVE 08/09/2007   DM type 2 with diabetic peripheral neuropathy ([place]) 05/09/2007   Reflex sympathetic dystrophy-- PAIN MNGMT  05/09/2007   Essential hypertension 04/15/2007   GERD 04/15/2007   Past Medical History:  Diagnosis Date   Allergic rhinitis    Arthritis    "back; ankles; hands; knees" (10/31/2013)   Bilateral sensorineural hearing loss    CAD (coronary artery disease)    a. Nonobst in 2011. b. Abnormal nuc 09/2013 -> s/p cutting balloon to D2, mild LAD disease; c. 08/2015 MV: no ischemia, EF 71%.   Carcinoma in situ in a polyp 1994   a. 1994 - malignant polyp removed during colonoscopy.   Chronic diastolic CHF (congestive heart failure) ([place])    a. 09/2013 Echo: EF 60-65%, mild LVH, Gr1 DD.   Dementia ([place])    Diverticulosis    DJD (degenerative joint disease)    Fatty liver    GERD (gastroesophageal reflux disease)    a.  Hx GERD/esophageal dysmotility followed by Dr. Olevia Perches.    Hemorrhoids    Hiatal hernia    a. s/p Nissen fundoplication 2542.   Hyperlipidemia    a. patient unwilling to use statins.   Hypertensive heart disease    IBS (irritable bowel syndrome)    Macular degeneration 03/2009   Dr. Rosana Hoes   Neuropathy    a. Hands, feet, legs.   Orthostatic hypotension    Osteomyelitis (HCC)    a. 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 01/2016 s/p RLE transtibial amputation 2/2 Charcot rocker-bottom deformity and insensate neuropathy ulceration.   Osteoporosis    PE (pulmonary embolism) 12/2007   a. PE/DVT after neck surgery 2009. b. coumadin d/c 10-2008.   PONV (postoperative nausea and vomiting) 2009   neck surgery   PPD positive    Recurrent UTI    RSD (reflex sympathetic dystrophy)    a. Chronic pain.   Spinal stenosis    Type II diabetes mellitus ([place])    no on medication   Venous insufficiency    a. Contributing to LEE.    Family History  Problem Relation Age of  Onset   Heart disease Mother        mitral valve replaced   Arthritis Mother    Diabetic kidney disease Daughter    Diabetes Father    Stroke Father    Migraines Sister    Hypertension Other    Breast cancer Other    Colon cancer Neg Hx    Esophageal cancer Neg Hx    Stomach cancer Neg Hx    Rectal cancer Neg Hx     Past Surgical History:  Procedure Laterality Date   AMPUTATION  03/06/2012   Procedure: AMPUTATION FOOT;  Surgeon: Newt Minion, MD;  Location: [place];  Service: Orthopedics;  Laterality: Left;  FIFTH RAY AMPUTATION    AMPUTATION Right 02/18/2016   Procedure: AMPUTATION BELOW KNEE;  Surgeon: Newt Minion, MD;  Location: [place];  Service: Orthopedics;  Laterality: Right;   AMPUTATION Left 11/22/2016   Procedure: Amputation 4th Toe Left Foot at Metatarsophalangeal Joint;  Surgeon: Newt Minion, MD;  Location: [place];  Service: Orthopedics;  Laterality: Left;   ANKLE FUSION  09/27/2012   Procedure: ANKLE FUSION;  Surgeon: Newt Minion, MD;  Location: [place];  Service: Orthopedics;  Laterality: Left;  Left Tibiocalcaneal Fusion   ANKLE FUSION Right 05/09/2013   Procedure: ANKLE FUSION;  Surgeon: Newt Minion, MD;  Location: [place];  Service: Orthopedics;  Laterality: Right;  Excision Osteomyelitis Base 1st MT Right Foot, Fusion Medial Column   ANTERIOR CERVICAL DECOMP/DISCECTOMY FUSION  12/18/07   For OA,  Dr. Lorin Mercy:  fu by a PE   ANTERIOR CERVICAL DECOMP/DISCECTOMY FUSION N/A 11/29/2020   Procedure: Anterior Cervical Decompression Fusion - Cervical Three-Cerivcal Four;  Surgeon: Earnie Larsson, MD;  Location: [facility];  Service: Neurosurgery;  Laterality: N/A;  3C   CARDIAC CATHETERIZATION  05/2010    at [place] Left 09/2011   CATARACT EXTRACTION W/ INTRAOCULAR LENS  IMPLANT, BILATERAL  2004   feb 2004 left, aug 2004 right   CHOLECYSTECTOMY  03/2002   CORONARY ANGIOPLASTY  10/31/2013   CORONARY ARTERY BYPASS GRAFT  09/27/2016   CYST REMOVAL HAND  06/2003    FOOT BONE EXCISION Right 06/2009   LAPAROSCOPIC RIGHT HEMI COLECTOMY N/A 01/08/2015   Procedure: LAPAROSCOPIC ASSISTED RIGHT HEMI  COLECTOMY;  Surgeon: Johnathan Hausen, MD;  [facility];  Service: General;  Laterality: N/A;   LEFT HEART CATHETERIZATION WITH CORONARY ANGIOGRAM N/A 10/31/2013   Procedure: LEFT HEART CATHETERIZATION WITH CORONARY ANGIOGRAM;  Surgeon: Jettie Booze, MD;  Location: [facility] C[facility] LAB;  Service: Cardiovascular;  Laterality: N/A;   NASAL SEPTUM SURGERY  22/4497   NISSEN FUNDOPLICATION  02/22/50   PERCUTANEOUS CORONARY INTERVENTION-BALLOON ONLY  10/31/2013   Procedure: PERCUTANEOUS CORONARY INTERVENTION-BALLOON ONLY;  Surgeon: Jettie Booze, MD;  Location: [facility] CATH LAB;  Service: Cardiovascular;;   ROTATOR CUFF REPAIR Right 07/2007   Dr. Morene Rankins CUFF REPAIR Left 11/2004   stent in left leg, behind knee  06/27/2017   x2, done at [facility] cardiology in [place] Right 08/2008   3rd toe, Dr. Sharol Given due to osteomyelitis   VAGINAL HYSTERECTOMY  03/1975   VEIN LIGATION Bilateral 03/1966   VENA CAVA FILTER PLACEMENT  01/2010   green filter; "due to blood clots"   WISDOM TOOTH EXTRACTION  06/2007   Social History   Occupational History   Occupation: Retired     Comment: from social services   Tobacco Use   Smoking status: Never   Smokeless tobacco: Never   Tobacco comments:    tried for a few months in college.   Vaping Use   Vaping Use: Never used  Substance and Sexual Activity   Alcohol use: No    Alcohol/week: 0.0 standard drinks   Drug use: No   Sexual activity: Not Currently    Birth control/protection: Post-menopausal

## 2021-10-19 ENCOUNTER — Other Ambulatory Visit: Payer: Self-pay | Admitting: *Deleted

## 2021-10-19 ENCOUNTER — Other Ambulatory Visit: Payer: Self-pay

## 2021-10-19 DIAGNOSIS — G43009 Migraine without aura, not intractable, without status migrainosus: Secondary | ICD-10-CM

## 2021-10-19 MED ORDER — AMITRIPTYLINE HCL 25 MG PO TABS: ORAL_TABLET | ORAL | 1 refills | Status: DC

## 2021-10-19 MED ORDER — OMEPRAZOLE 20 MG PO CPDR: 20.0000 mg | DELAYED_RELEASE_CAPSULE | Freq: Two times a day (BID) | ORAL | 3 refills | Status: DC

## 2021-10-19 NOTE — Progress Notes (Signed)
Refilled omeprazole for patient today per electronic refill request

## 2021-10-28 ENCOUNTER — Ambulatory Visit (INDEPENDENT_AMBULATORY_CARE_PROVIDER_SITE_OTHER): Payer: Medicare Other | Admitting: Internal Medicine

## 2021-10-28 ENCOUNTER — Encounter: Payer: Self-pay | Admitting: Internal Medicine

## 2021-10-28 VITALS — BP 126/68 | HR 69 | Temp 98.0°F | Resp 16 | Ht 60.0 in

## 2021-10-28 DIAGNOSIS — K5904 Chronic idiopathic constipation: Secondary | ICD-10-CM | POA: Diagnosis not present

## 2021-10-28 DIAGNOSIS — I739 Peripheral vascular disease, unspecified: Secondary | ICD-10-CM

## 2021-10-28 DIAGNOSIS — F03B0 Unspecified dementia, moderate, without behavioral disturbance, psychotic disturbance, mood disturbance, and anxiety: Secondary | ICD-10-CM | POA: Diagnosis not present

## 2021-10-28 DIAGNOSIS — E1142 Type 2 diabetes mellitus with diabetic polyneuropathy: Secondary | ICD-10-CM | POA: Diagnosis not present

## 2021-10-28 DIAGNOSIS — Z89511 Acquired absence of right leg below knee: Secondary | ICD-10-CM

## 2021-10-28 DIAGNOSIS — I10 Essential (primary) hypertension: Secondary | ICD-10-CM

## 2021-10-28 DIAGNOSIS — M1460 Charcot's joint, unspecified site: Secondary | ICD-10-CM

## 2021-10-28 DIAGNOSIS — Z79899 Other long term (current) drug therapy: Secondary | ICD-10-CM

## 2021-10-28 MED ORDER — DONEPEZIL HCL 10 MG PO TABS: 10.0000 mg | ORAL_TABLET | Freq: Every day | ORAL | 1 refills | Status: DC

## 2021-10-28 NOTE — Progress Notes (Signed)
Subjective:    Patient ID: [name], female    DOB: 1934/12/16, 87 y.o.   MRN: [id number]  DOS:  10/28/2021 Type of visit - description: Here with her son. Saw neurology, note reviewed. Saw Dr. Lenise Herald, note reviewed. Request a form to get a wheelchair. See last office visit, here for eval of dementia.  MMSE performed.  Review of Systems Overall no new concerns. Did report sinus congestion. Did report that previously had diarrhea and now she feels constipated.  Past Medical History:  Diagnosis Date   Allergic rhinitis    Arthritis    "back; ankles; hands; knees" (10/31/2013)   Bilateral sensorineural hearing loss    CAD (coronary artery disease)    a. Nonobst in 2011. b. Abnormal nuc 09/2013 -> s/p cutting balloon to D2, mild LAD disease; c. 08/2015 MV: no ischemia, EF 71%.   Carcinoma in situ in a polyp 1994   a. 1994 - malignant polyp removed during colonoscopy.   Chronic diastolic CHF (congestive heart failure) ([place])    a. 09/2013 Echo: EF 60-65%, mild LVH, Gr1 DD.   Dementia ([place])    Diverticulosis    DJD (degenerative joint disease)    Fatty liver    GERD (gastroesophageal reflux disease)    a. Hx GERD/esophageal dysmotility followed by Dr. Olevia Perches.    Hemorrhoids    Hiatal hernia    a. s/p Nissen fundoplication 3419.   Hyperlipidemia    a. patient unwilling to use statins.   Hypertensive heart disease    IBS (irritable bowel syndrome)    Macular degeneration 03/2009   Dr. Rosana Hoes   Neuropathy    a. Hands, feet, legs.   Orthostatic hypotension    Osteomyelitis (HCC)    a. 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 01/2016 s/p RLE transtibial amputation 2/2 Charcot rocker-bottom deformity and insensate neuropathy ulceration.   Osteoporosis    PE (pulmonary embolism) 12/2007   a. PE/DVT after neck surgery 2009. b. coumadin d/c 10-2008.   PONV (postoperative nausea and vomiting) 2009   neck surgery   PPD positive     Recurrent UTI    RSD (reflex sympathetic dystrophy)    a. Chronic pain.   Spinal stenosis    Type II diabetes mellitus ([place])    no on medication   Venous insufficiency    a. Contributing to LEE.    Past Surgical History:  Procedure Laterality Date   AMPUTATION  03/06/2012   Procedure: AMPUTATION FOOT;  Surgeon: Newt Minion, MD;  Location: [place];  Service: Orthopedics;  Laterality: Left;  FIFTH RAY AMPUTATION    AMPUTATION Right 02/18/2016   Procedure: AMPUTATION BELOW KNEE;  Surgeon: Newt Minion, MD;  Location: [facility];  Service: Orthopedics;  Laterality: Right;   AMPUTATION Left 11/22/2016   Procedure: Amputation 4th Toe Left Foot at Metatarsophalangeal Joint;  Surgeon: Newt Minion, MD;  Location: [place];  Service: Orthopedics;  Laterality: Left;   ANKLE FUSION  09/27/2012   Procedure: ANKLE FUSION;  Surgeon: Newt Minion, MD;  Location: [place];  Service: Orthopedics;  Laterality: Left;  Left Tibiocalcaneal Fusion   ANKLE FUSION Right 05/09/2013   Procedure: ANKLE FUSION;  Surgeon: Newt Minion, MD;  Location: [place];  Service: Orthopedics;  Laterality: Right;  Excision Osteomyelitis Base 1st MT Right Foot, Fusion Medial Column   ANTERIOR CERVICAL DECOMP/DISCECTOMY FUSION  12/18/07   For OA,  Dr. Lorin Mercy:  fu by a PE   ANTERIOR CERVICAL DECOMP/DISCECTOMY FUSION N/A 11/29/2020   Procedure: Anterior Cervical Decompression Fusion - Cervical Three-Cerivcal Four;  Surgeon: Earnie Larsson, MD;  Location: [place];  Service: Neurosurgery;  Laterality: N/A;  3C   CARDIAC CATHETERIZATION  05/2010    at [place] Left 09/2011   CATARACT EXTRACTION W/ INTRAOCULAR LENS  IMPLANT, BILATERAL  2004   feb 2004 left, aug 2004 right   CHOLECYSTECTOMY  03/2002   CORONARY ANGIOPLASTY  10/31/2013   CORONARY ARTERY BYPASS GRAFT  09/27/2016   CYST REMOVAL HAND  06/2003   FOOT BONE EXCISION Right 06/2009   LAPAROSCOPIC RIGHT HEMI COLECTOMY N/A 01/08/2015   Procedure: LAPAROSCOPIC ASSISTED RIGHT  HEMI COLECTOMY;  Surgeon: Johnathan Hausen, MD;  [facility];  Service: General;  Laterality: N/A;   LEFT HEART CATHETERIZATION WITH CORONARY ANGIOGRAM N/A 10/31/2013   Procedure: LEFT HEART CATHETERIZATION WITH CORONARY ANGIOGRAM;  Surgeon: Jettie Booze, MD;  Location: [facility] CATH LAB;  Service: Cardiovascular;  Laterality: N/A;   NASAL SEPTUM SURGERY  33/8250   NISSEN FUNDOPLICATION  02/22/9766   PERCUTANEOUS CORONARY INTERVENTION-BALLOON ONLY  10/31/2013   Procedure: PERCUTANEOUS CORONARY INTERVENTION-BALLOON ONLY;  Surgeon: Jettie Booze, MD;  Location: [facility] CATH LAB;  Service: Cardiovascular;;   ROTATOR CUFF REPAIR Right 07/2007   Dr. Morene Rankins CUFF REPAIR Left 11/2004   stent in left leg, behind knee  06/27/2017   x2, done at [facility] cardiology in [place] Right 08/2008   3rd toe, Dr. Sharol Given due to osteomyelitis   VAGINAL HYSTERECTOMY  03/1975   VEIN LIGATION Bilateral 03/1966   VENA CAVA FILTER PLACEMENT  01/2010   green filter; "due to blood clots"   WISDOM TOOTH EXTRACTION  06/2007    Current Outpatient Medications  Medication Instructions   Alpha-Lipoic Acid 1,200 mg, Oral, Daily   amitriptyline (ELAVIL) 25 MG tablet TAKE 1 TABLET(25 MG) BY MOUTH AT BEDTIME   atorvastatin (LIPITOR) 40 mg, Oral, Daily   carvedilol (COREG) 12.5 mg, Oral, 2 times daily with meals   clopidogrel (PLAVIX) 75 mg, Oral, Daily at bedtime, Restart on 12/04/2020   diclofenac Sodium (VOLTAREN) 4 g, Topical, 4 times daily   dicyclomine (BENTYL) 10 MG capsule TAKE 1 CAPSULE THREE TIMES A DAY BEFORE MEALS (NEED APPOINTMENT)   donepezil (ARICEPT) 10 mg, Oral, Daily at bedtime   Emgality 120 mg, Subcutaneous, Every 30 days   famotidine (PEPCID) 20 mg, Oral, 2 times daily   ferrous sulfate (FEROSUL) 325 mg, Oral, 2 times daily with meals   furosemide (LASIX) 20 mg, Oral, Daily   glucose blood (FREESTYLE LITE) test strip USE TO CHECK BLOOD SUGAR NO MORE THAN TWICE DAILY    HYDROcodone-acetaminophen (NORCO) 10-325 MG tablet 1 tablet, Oral, Every 6 hours PRN   hydrocortisone (ANUSOL-HC) 2.5 % rectal cream 1 application, Rectal, 2 times daily, As needed   hydrocortisone (ANUSOL-HC) 25 mg, Rectal, 2 times daily, As needed   hydrocortisone 2.5 % cream Topical, 2 times daily   Lancets (FREESTYLE) lancets CHECK BLOOD SUGAR TWICE DAILY   loperamide (IMODIUM A-D) 2 mg, Oral, As needed   lubiprostone ([name]) 24 mcg, Oral, 2 times daily with meals   MegaRed Omega-3 Krill Oil 500 MG CAPS Restart on 12/04/2020   methenamine (HIPREX) 1 g tablet 0.5 tablets, Oral, Daily   Multiple Vitamins-Minerals (PRESERVISION AREDS 2) CAPS 1 capsule, Oral, 2 times daily   naloxone (NARCAN) nasal  spray 4 mg/0.1 mL 1 spray, Nasal, Once PRN   nitroGLYCERIN (NITROSTAT) 0.4 mg, Sublingual, Every 5 min x3 PRN   omeprazole (PRILOSEC) 20 mg, Oral, 2 times daily before meals   ondansetron (ZOFRAN) 4 MG tablet TAKE 1 TABLET(4 MG) BY MOUTH EVERY 8 HOURS AS NEEDED FOR NAUSEA OR VOMITING   OVER THE COUNTER MEDICATION 1 capsule, Oral, 2 times daily PRN, Integrative Digestive Formula   OVER THE COUNTER MEDICATION 1 tablet, 3 times daily, Berberorine Gluco Defense    Polyvinyl Alcohol-Povidone (REFRESH OP) 1 drop, Both Eyes, 3 times daily PRN   pramipexole (MIRAPEX) 0.125 MG tablet TAKE 1 TO 2 TABLETS(0.125 TO 0.25 MG) BY MOUTH AT BEDTIME AS NEEDED   rivaroxaban (XARELTO) 2.5 mg, Oral, 2 times daily, Restart on 12/04/2020   Vitamin B-12 5,000 mcg, Sublingual, Daily   vitamin C with rose hips 500 mg, Oral, 3 times daily   Vitamin D3 5,000 Units, Oral, Every evening, Reported on 11/24/2015   vitamin E 400 Units, Oral, Daily       Objective:   Physical Exam BP 126/68 (BP Location: Right Arm, Patient Position: Sitting, Cuff Size: Normal)    Pulse 69    Temp 98 F (36.7 C) (Oral)    Resp 16    Ht 5' (1.524 m)    SpO2 97%    BMI 30.48 kg/m  General:   Well developed, NAD, BMI noted. HEENT:   Normocephalic . Face symmetric, atraumatic Neurologic:  alert, pleasant, cooperative. Speech normal, gait not tested. MMSE: 23/30  psych--  Behavior appropriate. No anxious or depressed appearing.      Assessment     ASSESSMENT DM, Neuropathy, amputations due to Charcot feet. - metformin intolerant  (lethargic) HTN  Hyperlipidemia LUNG MASS -incidental per CT 2009,  PET scan 01-2008: likely  benign, CT 08-2009 no change, no further CTs per Dr Gwenette Greet - Had a CT in [place] 09-2016 "spiculated mass", saw Dr Lamonte Sakai,  CT  09/2017 stable CV: ---CAD sees Dr Meda Coffee and a cardiologist in [facility] --NSTEMI 09-2016. Outside hospital: Cath 2 vessel disease, CABG 2, LIMA to LAD and Diag   ---Chronic diastolic CHF ---PVD: Aortogram and a stent L Leg Dr Feliberto Gottron, 06-27-2017 @ [facility] (per pt) GI: GERD, IBS, colon polyps, PUD, abdominal pain "post polypectomy syndrome" naueas meds prn (antivert-zofran, gets nausea when car traveling) Osteoporosis  MSK,pain mngmt : --Reflex sympathetic dystrophy --Neuropathy --DJD --Spinal stenosis  --H/o Osteomyelitis  Foot --s/p amputation:  4th 5th L toes , R BKA  (01-2016, dx osteomyelitis) w/ phantom pain  -- sees Dr Sharol Given prn She took oxycodone after surgery and that did NOT seem to help better than hydrocodone. Intolerant to Cymbalta, Neurontin ; Lyrica helped but caused edema. Recurrent UTIs: Dr Junious Silk  Venous insufficiency  H/o  + PPD H/o hypothyroidism: TSHs stable H/o dyspnea   PLAN:  Dementia, see last visit, blood work was negative, started Aricept. MMSE today scored 23, moderate, mostly related to difficulty with attention, calculation and naming. Rec to increase Aricept to 10 mg nightly. Headaches  Saw neurology 08/17/2021, at the request of Ortho for mngmt of headaches, was RX  Emgality , per pt seems to help Gait d/o: She also complains of falls.  Neurology already advised her about fall prevention.  I reinforced that as well. Form for  wheelchair provided. MSK, pain management Rx tramadol by a PA @ Dr Trenton Gammon office however the patient developed swelling and is back on hydrocodone. Saw Dr. Gavin Potters 10/10/2021,  reason for visit increased pain on the knees worse on the left.   Constipation: Previously had diarrhea, no constipation.  She will see GI soon.  Rec  to reach out if she has nausea vomiting or severe pain.  Son plans to give her some OTCs if needed Polypharmacy: Refer to our clinical pharmacist RTC 4 months    This visit occurred during the SARS-CoV-2 public health emergency.  Safety protocols were in place, including screening questions prior to the visit, additional usage of staff PPE, and extensive cleaning of exam room while observing appropriate contact time as indicated for disinfecting solutions.

## 2021-10-28 NOTE — Patient Instructions (Addendum)
Per our records you are due for your diabetic eye exam. Please contact your eye doctor to schedule an appointment. Please have them send copies of your office visit notes to [place]. Our fax number is [phone]. If you need a referral to an eye doctor please let us know.   Increase Aricept to 10 mg at bedtime (you can take Aricept 5 mg 2 tablets at night, were sending a new prescription   GO TO THE FRONT DESK, [place] back for   a checkup in 3 to 4 months.

## 2021-10-30 DIAGNOSIS — F0390 Unspecified dementia without behavioral disturbance: Secondary | ICD-10-CM | POA: Insufficient documentation

## 2021-10-30 NOTE — Assessment & Plan Note (Signed)
Dementia, see last visit, blood work was negative, started Aricept. MMSE today scored 23, moderate, mostly related to difficulty with attention, calculation and naming. Rec to increase Aricept to 10 mg nightly. Headaches  Saw neurology 08/17/2021, at the request of Ortho for mngmt of headaches, was RX  Emgality , per pt seems to help Gait d/o: She also complains of falls.  Neurology already advised her about fall prevention.  I reinforced that as well. Form for wheelchair provided. MSK, pain management Rx tramadol by a PA @ Dr Trenton Gammon office however the patient developed swelling and is back on hydrocodone. Saw Dr. Gavin Potters 10/10/2021, reason for visit increased pain on the knees worse on the left.   Constipation: Previously had diarrhea, no constipation.  She will see GI soon.  Rec  to reach out if she has nausea vomiting or severe pain.  Son plans to give her some OTCs if needed Polypharmacy: Refer to our clinical pharmacist RTC 4 months

## 2021-10-31 ENCOUNTER — Telehealth: Payer: Self-pay | Admitting: *Deleted

## 2021-10-31 NOTE — Chronic Care Management (AMB) (Signed)
Chronic Care Management   Note  10/31/2021 Name: [name] MRN: [id number] DOB: 11/10/1934  [name] is a 87 y.o. year old female who is a primary care patient of Colon Branch, MD. I reached out to Gretel Acre by phone today in response to a referral sent by [name] PCP.  [name] was given information about Chronic Care Management services today including:  CCM service includes personalized support from designated clinical staff supervised by her physician, including individualized plan of care and coordination with other care providers 24/7 contact phone numbers for assistance for urgent and routine care needs. Service will only be billed when office clinical staff spend 20 minutes or more in a month to coordinate care. Only one practitioner may furnish and bill the service in a calendar month. The patient may stop CCM services at any time (effective at the end of the month) by phone call to the office staff. The patient is responsible for co-pay (up to 20% after annual deductible is met) if co-pay is required by the individual health plan.   Patient agreed to services and verbal consent obtained.   Follow up plan: Telephone appointment with care management team member scheduled for: 11/21/2021  Julian Hy, [facility]  Direct Dial: [phone]

## 2021-11-15 ENCOUNTER — Ambulatory Visit (INDEPENDENT_AMBULATORY_CARE_PROVIDER_SITE_OTHER)
Admission: RE | Admit: 2021-11-15 | Discharge: 2021-11-15 | Disposition: A | Discharge status: Home / Self Care | Payer: Medicare Other | Source: Ambulatory Visit | Attending: Gastroenterology | Admitting: Gastroenterology

## 2021-11-15 ENCOUNTER — Encounter: Payer: Self-pay | Admitting: Gastroenterology

## 2021-11-15 ENCOUNTER — Telehealth: Payer: Self-pay | Admitting: Pharmacist

## 2021-11-15 ENCOUNTER — Ambulatory Visit (INDEPENDENT_AMBULATORY_CARE_PROVIDER_SITE_OTHER): Payer: Medicare Other | Admitting: Gastroenterology

## 2021-11-15 ENCOUNTER — Other Ambulatory Visit: Payer: Self-pay

## 2021-11-15 ENCOUNTER — Other Ambulatory Visit: Payer: Medicare Other

## 2021-11-15 VITALS — BP 106/60 | HR 72 | Ht 63.0 in | Wt 159.1 lb

## 2021-11-15 DIAGNOSIS — M25552 Pain in left hip: Secondary | ICD-10-CM

## 2021-11-15 DIAGNOSIS — R1032 Left lower quadrant pain: Secondary | ICD-10-CM | POA: Diagnosis not present

## 2021-11-15 DIAGNOSIS — R1030 Lower abdominal pain, unspecified: Secondary | ICD-10-CM | POA: Diagnosis not present

## 2021-11-15 DIAGNOSIS — K5900 Constipation, unspecified: Secondary | ICD-10-CM

## 2021-11-15 DIAGNOSIS — A09 Infectious gastroenteritis and colitis, unspecified: Secondary | ICD-10-CM

## 2021-11-15 DIAGNOSIS — Z9181 History of falling: Secondary | ICD-10-CM

## 2021-11-15 MED ORDER — LINACLOTIDE 72 MCG PO CAPS: 72.0000 ug | ORAL_CAPSULE | Freq: Every day | ORAL | 2 refills | Status: DC

## 2021-11-15 NOTE — Progress Notes (Signed)
[name]    [id number]    03/27/1935  Primary Care Physician:Paz, Alda Berthold, MD  Referring Physician: Colon Branch, MD [address] [place],  [place] [address]   Chief complaint: Irregular bowel habits, abdominal pain  HPI:  87 year old very pleasant female with history of CAD, and STEMI status post CABG, peripheral vascular disease status post partial amputation of left lower leg with prosthesis on chronic antiplatelet and anticoagulation here for follow-up visit for irregular bowel habits with alternating constipation and diarrhea She has episodes of constipation for 1 to 2 days followed by diarrhea, she was experiencing more episodes of diarrhea few weeks ago which has since resolved She is accompanied by her son. According to her son she had an episode of severe constipation for which he gave her magnesium citrate, that subsequently caused her to have severe diarrhea but it lasted for only a day. She has intermittent bright red blood per rectum from hemorrhoids  She had a fall few days ago, had a soft landing on carpet.  She has no external bruising but complains of worsening left-sided abdominal pain     She is s/p hemorrhoidal band ligation in 2017.  She was evaluated by [facility] for hemorrhoidectomy, was thought not to be a candidate for surgical repair.   CT abdomen and pelvis with contrast 12/24/2015 1. No acute abdominopelvic findings. 2. Colonic diverticulosis without evidence of acute diverticulitis. 3. Single bubble of air within the urinary bladder. Correlate for history of recent instrumentation. 4. Postsurgical changes at the gastroesophageal junction from Nissen fundoplication with small recurrent hiatal hernia.   Colonoscopy March 2017 with internal and external hemorrhoids, left-sided diverticulosis, removal of 3 subcentimeter sessile and pedunculated polyps [tubular adenomas]     Outpatient Encounter Medications as of  11/15/2021  Medication Sig   Alpha-Lipoic Acid 600 MG CAPS Take 1,200 mg by mouth daily.   amitriptyline (ELAVIL) 25 MG tablet TAKE 1 TABLET(25 MG) BY MOUTH AT BEDTIME   Ascorbic Acid (VITAMIN C WITH ROSE HIPS) 500 MG tablet Take 500 mg by mouth in the morning, at noon, and at bedtime.   atorvastatin (LIPITOR) 40 MG tablet Take 40 mg by mouth daily.   carvedilol (COREG) 12.5 MG tablet Take 12.5 mg by mouth 2 (two) times daily with a meal.   Cholecalciferol (VITAMIN D3) 5000 UNITS CAPS Take 5,000 Units by mouth every evening. Reported on 11/24/2015   CINNAMON PO Take 4,000 mg by mouth daily.   clopidogrel (PLAVIX) 75 MG tablet Take 1 tablet (75 mg total) by mouth at bedtime. Restart on 12/04/2020   Cyanocobalamin (VITAMIN B-12) 500 MCG SUBL Place 5,000 mcg under the tongue daily.   diclofenac Sodium (VOLTAREN) 1 % GEL Apply 4 g topically 4 (four) times daily.   dicyclomine (BENTYL) 10 MG capsule TAKE 1 CAPSULE THREE TIMES A DAY BEFORE MEALS (NEED APPOINTMENT)   donepezil (ARICEPT) 10 MG tablet Take 1 tablet (10 mg total) by mouth at bedtime.   famotidine (PEPCID) 20 MG tablet Take 1 tablet (20 mg total) by mouth 2 (two) times daily.   ferrous sulfate (FEROSUL) 325 (65 FE) MG tablet Take 1 tablet (325 mg total) by mouth 2 (two) times daily with a meal.   furosemide (LASIX) 20 MG tablet Take 20 mg by mouth daily.   Galcanezumab-gnlm (EMGALITY) 120 MG/ML SOAJ Inject 120 mg into the skin every 30 (thirty) days.   glucose blood (  FREESTYLE LITE) test strip USE TO CHECK BLOOD SUGAR NO MORE THAN TWICE DAILY   HYDROcodone-acetaminophen (NORCO) 10-325 MG tablet Take 1 tablet by mouth every 6 (six) hours as needed.   hydrocortisone (ANUSOL-HC) 2.5 % rectal cream Place 1 application rectally 2 (two) times daily. As needed   hydrocortisone (ANUSOL-HC) 25 MG suppository Place 1 suppository (25 mg total) rectally 2 (two) times daily. As needed   hydrocortisone 2.5 % cream Apply topically 2 (two) times daily.    Lancets (FREESTYLE) lancets CHECK BLOOD SUGAR TWICE DAILY   loperamide (IMODIUM A-D) 2 MG tablet Take 2 mg by mouth as needed for diarrhea or loose stools.   lubiprostone ([name]) 24 MCG capsule Take 1 capsule (24 mcg total) by mouth 2 (two) times daily with a meal.   MegaRed Omega-3 Krill Oil 500 MG CAPS Restart on 12/04/2020   methenamine (HIPREX) 1 g tablet Take 0.5 tablets by mouth daily.   Multiple Vitamins-Minerals (PRESERVISION AREDS 2) CAPS Take 1 capsule by mouth 2 (two) times daily.   naloxone (NARCAN) nasal spray 4 mg/0.1 mL Place 1 spray into the nose once as needed for up to 1 dose.   nitroGLYCERIN (NITROSTAT) 0.4 MG SL tablet Place 1 tablet (0.4 mg total) under the tongue every 5 (five) minutes x 3 doses as needed for chest pain.   omeprazole (PRILOSEC) 20 MG capsule Take 1 capsule (20 mg total) by mouth 2 (two) times daily before a meal.   ondansetron (ZOFRAN) 4 MG tablet TAKE 1 TABLET(4 MG) BY MOUTH EVERY 8 HOURS AS NEEDED FOR NAUSEA OR VOMITING   OVER THE COUNTER MEDICATION 1 tablet 3 (three) times daily. Berberorine Gluco Defense   Polyvinyl Alcohol-Povidone (REFRESH OP) Place 1 drop into both eyes 3 (three) times daily as needed (dry eyes).    pramipexole (MIRAPEX) 0.125 MG tablet TAKE 1 TO 2 TABLETS(0.125 TO 0.25 MG) BY MOUTH AT BEDTIME AS NEEDED   Probiotic Product (PROBIOTIC PEARLS PO) Take 1 tablet by mouth daily.   rivaroxaban (XARELTO) 2.5 MG TABS tablet Take 1 tablet (2.5 mg total) by mouth 2 (two) times daily. Restart on 12/04/2020   vitamin E 180 MG (400 UNITS) capsule Take 400 Units by mouth daily.   [DISCONTINUED] OVER THE COUNTER MEDICATION Take 1 capsule by mouth 2 (two) times daily as needed (digestive issues). Integrative Digestive Formula   No facility-administered encounter medications on file as of 11/15/2021.    Allergies as of 11/15/2021 - Review Complete 11/15/2021  Allergen Reaction Noted   Cymbalta [duloxetine hcl] Swelling 09/25/2012   Gabapentin  Swelling 32/99/2426   Silicone Hives, Itching, Dermatitis, and Rash    Metformin and related Other (See Comments) 12/10/2015   Tramadol Swelling 10/28/2021   Adhesive [tape] Rash 07/17/2011   Cefazolin Hives 03/06/2012   Ciprofloxacin Other (See Comments) 01/31/2013   Clorazepate dipotassium Other (See Comments)    Dilaudid [hydromorphone hcl] Other (See Comments) 03/11/2010   Doxycycline Rash 11/03/2008   Enablex [darifenacin hydrobromide er] Other (See Comments) 03/06/2012   Levofloxacin Other (See Comments) 01/08/2014   Lyrica [pregabalin] Swelling 05/08/2013   Methadone hcl Other (See Comments)    Morphine and related Other (See Comments) 10/31/2013   Talwin [pentazocine] Other (See Comments) 12/04/2012    Past Medical History:  Diagnosis Date   Allergic rhinitis    Arthritis    "back; ankles; hands; knees" (10/31/2013)   Bilateral sensorineural hearing loss    CAD (coronary artery disease)    a. Nonobst in 2011. b.  Abnormal nuc 09/2013 -> s/p cutting balloon to D2, mild LAD disease; c. 08/2015 MV: no ischemia, EF 71%.   Carcinoma in situ in a polyp 1994   a. 1994 - malignant polyp removed during colonoscopy.   Chronic diastolic CHF (congestive heart failure) ([place])    a. 09/2013 Echo: EF 60-65%, mild LVH, Gr1 DD.   Dementia ([place])    Diverticulosis    DJD (degenerative joint disease)    Fatty liver    GERD (gastroesophageal reflux disease)    a. Hx GERD/esophageal dysmotility followed by Dr. Olevia Perches.    Hemorrhoids    Hiatal hernia    a. s/p Nissen fundoplication 9417.   Hyperlipidemia    a. patient unwilling to use statins.   Hypertensive heart disease    IBS (irritable bowel syndrome)    Macular degeneration 03/2009   Dr. Rosana Hoes   Neuropathy    a. Hands, feet, legs.   Orthostatic hypotension    Osteomyelitis (HCC)    a. 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 01/2016 s/p RLE transtibial amputation 2/2 Charcot  rocker-bottom deformity and insensate neuropathy ulceration.   Osteoporosis    PE (pulmonary embolism) 12/2007   a. PE/DVT after neck surgery 2009. b. coumadin d/c 10-2008.   PONV (postoperative nausea and vomiting) 2009   neck surgery   PPD positive    Recurrent UTI    RSD (reflex sympathetic dystrophy)    a. Chronic pain.   Spinal stenosis    Type II diabetes mellitus ([employer])    no on medication   Venous insufficiency    a. Contributing to LEE.    Past Surgical History:  Procedure Laterality Date   AMPUTATION  03/06/2012   Procedure: AMPUTATION FOOT;  Surgeon: Newt Minion, MD;  Location: [facility];  Service: Orthopedics;  Laterality: Left;  FIFTH RAY AMPUTATION    AMPUTATION Right 02/18/2016   Procedure: AMPUTATION BELOW KNEE;  Surgeon: Newt Minion, MD;  Location: [facility];  Service: Orthopedics;  Laterality: Right;   AMPUTATION Left 11/22/2016   Procedure: Amputation 4th Toe Left Foot at Metatarsophalangeal Joint;  Surgeon: Newt Minion, MD;  Location: [facility];  Service: Orthopedics;  Laterality: Left;   ANKLE FUSION  09/27/2012   Procedure: ANKLE FUSION;  Surgeon: Newt Minion, MD;  Location: [facility];  Service: Orthopedics;  Laterality: Left;  Left Tibiocalcaneal Fusion   ANKLE FUSION Right 05/09/2013   Procedure: ANKLE FUSION;  Surgeon: Newt Minion, MD;  Location: [facility];  Service: Orthopedics;  Laterality: Right;  Excision Osteomyelitis Base 1st MT Right Foot, Fusion Medial Column   ANTERIOR CERVICAL DECOMP/DISCECTOMY FUSION  12/18/07   For OA,  Dr. Lorin Mercy:  fu by a PE   ANTERIOR CERVICAL DECOMP/DISCECTOMY FUSION N/A 11/29/2020   Procedure: Anterior Cervical Decompression Fusion - Cervical Three-Cerivcal Four;  Surgeon: Earnie Larsson, MD;  Location: [facility];  Service: Neurosurgery;  Laterality: N/A;  3C   CARDIAC CATHETERIZATION  05/2010    at [facility] Left 09/2011   CATARACT EXTRACTION W/ INTRAOCULAR LENS  IMPLANT, BILATERAL  2004   feb 2004 left, aug 2004 right    CHOLECYSTECTOMY  03/2002   CORONARY ANGIOPLASTY  10/31/2013   CORONARY ARTERY BYPASS GRAFT  09/27/2016   CYST REMOVAL HAND  06/2003   FOOT BONE EXCISION Right 06/2009   LAPAROSCOPIC RIGHT HEMI COLECTOMY N/A 01/08/2015   Procedure: LAPAROSCOPIC ASSISTED RIGHT HEMI COLECTOMY;  Surgeon: Johnathan Hausen, MD;  Location: [facility];  Service: General;  Laterality: N/A;   LEFT HEART CATHETERIZATION WITH CORONARY ANGIOGRAM N/A 10/31/2013   Procedure: LEFT HEART CATHETERIZATION WITH CORONARY ANGIOGRAM;  Surgeon: Jettie Booze, MD;  Location[facility] C[facility];  Service: Cardiovascular;  Laterality: N/A;   NASAL SEPTUM SURGERY  14/4818   NISSEN FUNDOPLICATION  02/26/3148   PERCUTANEOUS CORONARY INTERVENTION-BALLOON ONLY  10/31/2013   Procedure: PERCUTANEOUS CORONARY INTERVENTION-BALLOON ONLY;  Surgeon: Jettie Booze, MD;  [facility];  Service: Cardiovascular;;   ROTATOR CUFF REPAIR Right 07/2007   Dr. Morene Rankins CUFF REPAIR Left 11/2004   stent in left leg, behind knee  06/27/2017   x2, done at [facility] cardiology in [facility]   TOE AMPUTATION Right 2008/09/17   3rd toe, Dr. Sharol Given due to osteomyelitis   VAGINAL HYSTERECTOMY  03/1975   VEIN LIGATION Bilateral 03/1966   VENA CAVA FILTER PLACEMENT  01/2010   green filter; "due to blood clots"   WISDOM TOOTH EXTRACTION  06/2007    Family History  Problem Relation Age of Onset   Heart disease Mother        mitral valve replaced   Arthritis Mother    Diabetic kidney disease Daughter    Diabetes Father    Stroke Father    Migraines Sister    Hypertension Other    Breast cancer Other    Colon cancer Neg Hx    Esophageal cancer Neg Hx    Stomach cancer Neg Hx    Rectal cancer Neg Hx     Social History   Socioeconomic History   Marital status: Divorced    Spouse name: Not on file   Number of children: 3   Years of education: 15   Highest education level: Not on file  Occupational History   Occupation: Retired     Comment: from  social services   Tobacco Use   Smoking status: Never   Smokeless tobacco: Never   Tobacco comments:    tried for a few months in college.   Vaping Use   Vaping Use: Never used  Substance and Sexual Activity   Alcohol use: No    Alcohol/week: 0.0 standard drinks   Drug use: No   Sexual activity: Not Currently    Birth control/protection: Post-menopausal  Other Topics Concern   Not on file  Social History Narrative   Daughter [name] deceased 09/17/2020   daughter lives in town   1 son in [place], Cl[place]ia Cuff comes home every 4 weeks to visit   Caffeine use:  Tea 1/day w/ dinner   Coffee occass          Currently lives in [place] with son Hanna Ra.   Social Determinants of Health   Financial Resource Strain: Low Risk    Difficulty of Paying Living Expenses: Not hard at all  Food Insecurity: No Food Insecurity   Worried About Charity fundraiser in the Last Year: Never true   [place] in the Last Year: Never true  Transportation Needs: No Transportation Needs   Lack of Transportation (Medical): No   Lack of Transportation (Non-Medical): No  Physical Activity: Inactive   Days of Exercise per Week: 0 days   Minutes of Exercise per Session: 0 min  Stress: No Stress Concern Present   Feeling of Stress : Not at all  Social Connections: Socially Isolated   Frequency of Communication with  Friends and Family: More than three times a week   Frequency of Social Gatherings with Friends and Family: Once a week   Attends Religious Services: Never   Marine scientist or Organizations: No   Attends Archivist Meetings: Never   Marital Status: Widowed  Human resources officer Violence: Not At Risk   Fear of Current or Ex-Partner: No   Emotionally Abused: No   Physically Abused: No   Sexually Abused: No      Review of systems: All other review of systems negative except as mentioned in the HPI.   Physical Exam: Vitals:   11/15/21 0922  BP: 106/60  Pulse: 72    Body mass index is 28.19 kg/m. Gen:      No acute distress, wheelchair-bound HEENT:  sclera anicteric Abd:      soft, non-tender; no palpable masses, no distension Ext:    Right lower extremity prosthetic leg, edema in left lower leg Neuro: alert and oriented x 3 Psych: normal mood and affect  Data Reviewed:  Reviewed labs, radiology imaging, old records and pertinent past GI work up   Assessment and Plan/Recommendations:  87 year old female with CAD s/p CABG, peripheral vascular disease s/p left leg amputation on Plavix and Xarelto here with complaints of irregular bowel habits with alternating constipation and diarrhea intermittent rectal bleeding and left lower quadrant abdominal pain   Diarrhea she had severe diarrhea few weeks ago that has since resolved, advised patient to submit stool sample for GI pathogen panel if she develops recurrent diarrhea to exclude infectious etiology  Irritable bowel syndrome with alternating constipation and diarrhea Will switch to Linzess 72 mcg daily Stop [name] Advised patient to use additional stool softener, docusate daily as needed to prevent constipation.  If she is having difficulty evacuating, she can use glycerin suppositories as needed Encouraged patient to increase dietary fiber and increase water to 8 cups daily  History of fall and left side abdominal pain: No visible bruising or rebound tenderness on exam Will obtain abdominal x-ray two-view and left hip x-ray to exclude any acute pathology  Rectal bleeding: Likely secondary to small-volume hemorrhage from internal hemorrhoids in the setting of dual antiplatelet therapy and anticoagulation She is not a candidate for hemorrhoidal band ligation or hemorrhoidectomy We will try to conservatively manage with Anusol cream and suppositories as needed Avoid excessive straining during defecation   Return as needed  This visit required 30 minutes of patient care (this includes  precharting, chart review, review of results, face-to-face time used for counseling as well as treatment plan and follow-up. The patient was provided an opportunity to ask questions and all were answered. The patient agreed with the plan and demonstrated an understanding of the instructions.  Damaris Hippo , MD    CC: Colon Branch, MD

## 2021-11-15 NOTE — Chronic Care Management (AMB) (Signed)
Chronic Care Management Pharmacy Assistant   Name: [name]  MRN: [id number] DOB: May 31, 1935  [name] is an 87 y.o. year old female who presents for his initial CCM visit with the clinical pharmacist.  Recent office visits:  10/28/21-Jose Ladona Horns, MD (PCP) General follow up visit. Increase Aricept to 10 mg nightly. Follow up in 4 months. 09/02/21-Tabitha Dugal, FNP. General follow up visit. EKG ordered.  07/27/21-Jose Ladona Horns, MD (PCP) Seen for a general follow up visit. Start Aricept 5 mg daily. Flu shot given. Follow up in 3 months. 06/08/21-Jose Ladona Horns, MD (PCP) Hospital follow up visit. Start on amoxicillin 875-125 mg for 7 days.   Recent consult visits:  11/15/21-(Gastroenterology) Mauri Pole, MD. Seen for abdominal pain. Stop [name]. Start Linzess 72 mcg and add Glycerin Suppositories as needed with colace (Docusate) daily. 10/10/21-(Orthopedics) Newt Minion, MD Seen for bilateral knee pain. Joint injection given.  09/29/21-([facility]) Thane Edu, [facility] 09/20/21-([facility]) Hnery A. Pool, MD 08/31/21-([facility]) Thane Edu, [facility] 08/17/21-(Neurology) Larina Bras. Jaynee Eagles, MD. Seen for chronic migraines. Emgality samples given. Follow up in 5 months. 07/28/21-([facility]) Thane Edu, [facility] 07/20/21- [facility]) Henery A. Pool, MD 05/27/21-[facility] Cand[facility] Schatz (Pain Medicine) Seen for back pain leg pain and hip pain. Increase Gabapentin from 100 mg at bedtime to 100 mg three times daily. Follow up in 4 weeks.  Hospital visits: Admitted to the hospital on 09/02/21 due to Muscle Pain. Discharge date was 09/02/21. Discharged from [place]?Medications Started at [facility] Discharge:?? -started  Diclofenac sodium 1% gel four times daily  Medication Changes  at Hospital Discharge: -Changed None noted  Medications Discontinued at Hospital Discharge: -Stopped None noted  Medications that remain the same after Hospital Discharge:??  -All other medications will remain the same.     Admitted to the hospital on 06/01/21 due to Back pain. Discharge date was 06/02/21. Discharged from [place]?Medications Started at [facility] Discharge:?? -started None noted  Medication Changes at Hospital Discharge: -Changed None noted  Medications Discontinued at Hospital Discharge: -Stopped None noted  Medications that remain the same after Hospital Discharge:??  -All other medications will remain the same.   Medications: Outpatient Encounter Medications as of 11/15/2021  Medication Sig Note   Alpha-Lipoic Acid 600 MG CAPS Take 1,200 mg by mouth daily.    amitriptyline (ELAVIL) 25 MG tablet TAKE 1 TABLET(25 MG) BY MOUTH AT BEDTIME    Ascorbic Acid (VITAMIN C WITH ROSE HIPS) 500 MG tablet Take 500 mg by mouth in the morning, at noon, and at bedtime.    atorvastatin (LIPITOR) 40 MG tablet Take 40 mg by mouth daily.    carvedilol (COREG) 12.5 MG tablet Take 12.5 mg by mouth 2 (two) times daily with a meal.    Cholecalciferol (VITAMIN D3) 5000 UNITS CAPS Take 5,000 Units by mouth every evening. Reported on 11/24/2015    CINNAMON PO Take 4,000 mg by mouth daily.    clopidogrel (PLAVIX) 75 MG tablet Take 1 tablet (75 mg total) by mouth at bedtime. Restart on 12/04/2020    Cyanocobalamin (VITAMIN B-12) 500 MCG SUBL Place 5,000 mcg under the tongue daily.    diclofenac Sodium (VOLTAREN) 1 % GEL Apply 4 g topically 4 (four) times daily.    dicyclomine (BENTYL) 10 MG capsule TAKE 1 CAPSULE THREE TIMES  A DAY BEFORE MEALS (NEED APPOINTMENT)    donepezil (ARICEPT) 10 MG tablet Take 1 tablet (10 mg total) by mouth at bedtime.    famotidine (PEPCID) 20 MG tablet Take 1 tablet (20 mg total) by mouth 2 (two) times daily.    ferrous sulfate (FEROSUL) 325  (65 FE) MG tablet Take 1 tablet (325 mg total) by mouth 2 (two) times daily with a meal.    furosemide (LASIX) 20 MG tablet Take 20 mg by mouth daily.    Galcanezumab-gnlm (EMGALITY) 120 MG/ML SOAJ Inject 120 mg into the skin every 30 (thirty) days.    glucose blood (FREESTYLE LITE) test strip USE TO CHECK BLOOD SUGAR NO MORE THAN TWICE DAILY    HYDROcodone-acetaminophen (NORCO) 10-325 MG tablet Take 1 tablet by mouth every 6 (six) hours as needed.    hydrocortisone (ANUSOL-HC) 2.5 % rectal cream Place 1 application rectally 2 (two) times daily. As needed    hydrocortisone (ANUSOL-HC) 25 MG suppository Place 1 suppository (25 mg total) rectally 2 (two) times daily. As needed    hydrocortisone 2.5 % cream Apply topically 2 (two) times daily.    Lancets (FREESTYLE) lancets CHECK BLOOD SUGAR TWICE DAILY    linaclotide (LINZESS) 72 MCG capsule Take 1 capsule (72 mcg total) by mouth daily before breakfast.    loperamide (IMODIUM A-D) 2 MG tablet Take 2 mg by mouth as needed for diarrhea or loose stools.    lubiprostone ([name]) 24 MCG capsule Take 1 capsule (24 mcg total) by mouth 2 (two) times daily with a meal.    MegaRed Omega-3 Krill Oil 500 MG CAPS Restart on 12/04/2020    methenamine (HIPREX) 1 g tablet Take 0.5 tablets by mouth daily.    Multiple Vitamins-Minerals (PRESERVISION AREDS 2) CAPS Take 1 capsule by mouth 2 (two) times daily.    naloxone (NARCAN) nasal spray 4 mg/0.1 mL Place 1 spray into the nose once as needed for up to 1 dose.    nitroGLYCERIN (NITROSTAT) 0.4 MG SL tablet Place 1 tablet (0.4 mg total) under the tongue every 5 (five) minutes x 3 doses as needed for chest pain. 06/11/2020: PRN   omeprazole (PRILOSEC) 20 MG capsule Take 1 capsule (20 mg total) by mouth 2 (two) times daily before a meal.    ondansetron (ZOFRAN) 4 MG tablet TAKE 1 TABLET(4 MG) BY MOUTH EVERY 8 HOURS AS NEEDED FOR NAUSEA OR VOMITING    OVER THE COUNTER MEDICATION 1 tablet 3 (three) times daily. Berberorine  Gluco Defense    Polyvinyl Alcohol-Povidone (REFRESH OP) Place 1 drop into both eyes 3 (three) times daily as needed (dry eyes).     pramipexole (MIRAPEX) 0.125 MG tablet TAKE 1 TO 2 TABLETS(0.125 TO 0.25 MG) BY MOUTH AT BEDTIME AS NEEDED    Probiotic Product (PROBIOTIC PEARLS PO) Take 1 tablet by mouth daily.    rivaroxaban (XARELTO) 2.5 MG TABS tablet Take 1 tablet (2.5 mg total) by mouth 2 (two) times daily. Restart on 12/04/2020    vitamin E 180 MG (400 UNITS) capsule Take 400 Units by mouth daily.    No facility-administered encounter medications on file as of 11/15/2021.   Alpha-Lipoic Acid 600 MG CAPS Last filled:None noted amitriptyline (ELAVIL) 25 MG tablet Last filled:10/19/21 90 DS Ascorbic Acid (VITAMIN C WITH ROSE HIPS) 500 MG tablet Last filled:None noted atorvastatin (LIPITOR) 40 MG tablet Last filled:09/09/21 90 DS carvedilol (COREG) 12.5 MG tablet Last filled:10/28/21 90 DS Cholecalciferol (VITAMIN D3) 5000 UNITS CAPS Last filled:None noted  clopidogrel (PLAVIX) 75 MG tablet Last filled:08/07/21 90 DS Cyanocobalamin (VITAMIN B-12) 500 MCG SUBL Last filled:None noted diclofenac Sodium (VOLTAREN) 1 % GEL Last filled:09/02/21 7 DS dicyclomine (BENTYL) 10 MG capsule Last filled:09/21/21 90 DS donepezil (ARICEPT) 10 MG tablet Last filled:10/28/21 90 DS famotidine (PEPCID) 20 MG tablet Last filled:09/05/21 90 DS ferrous sulfate (FEROSUL) 325 (65 FE) MG  Last filled:09/21/21 90 DS furosemide (LASIX) 20 MG tablet Last filled:10/25/21 90 DS Galcanezumab-gnlm (EMGALITY) 120 MG/ML SOAJ Last filled:10/07/20 28 DS HYDROcodone-acetaminophen (NORCO) 10-325 MG tablet Last filled:07/18/21 30 DS hydrocortisone (ANUSOL-HC) 2.5 % rectal cream Last filled:06/23/21 15 DS hydrocortisone (ANUSOL-HC) 25 MG suppository Last filled:03/10/21 6 DS linaclotide (LINZESS) 72 MCG capsule Last filled:None noted loperamide (IMODIUM A-D) 2 MG tablet Last filled:None noted lubiprostone ([name]) 24 MCG capsule  Last filled:10/18/21 90 DS methenamine (HIPREX) 1 g tablet Last filled:09/05/21 60 DS naloxone (NARCAN) nasal spray 4 mg/0.1 mL Last filled:10/08/20 2 DS nitroGLYCERIN (NITROSTAT) 0.4 MG SL tablet Last filled:12/25/19 7 DS omeprazole (PRILOSEC) 20 MG capsule Last filled:10/19/21 90 DS ondansetron (ZOFRAN) 4 MG tablet Last filled:08/16/20 6 DS Polyvinyl Alcohol-Povidone (REFRESH OP) Last filled:None noted pramipexole (MIRAPEX) 0.125 MG tablet Last filled:09/21/21 90 DS Probiotic Product (PROBIOTIC PEARLS PO) Last filled:None noted rivaroxaban (XARELTO) 2.5 MG TABS tablet Last filled:09/04/21 90 DS   Care Gaps: COVID-19 Vaccine:Overdue since 12/27/2020  OPHTHALMOLOGY EXAM:Last completed: Aug 20, 2020 HEMOGLOBIN A1C:Last completed: Mar 18, 2021  Star Rating Drugs: atorvastatin (LIPITOR) 40 MG tablet Last filled:09/09/21 90 DS  Corrie Mckusick, [place]

## 2021-11-15 NOTE — Patient Instructions (Signed)
Your provider has requested that you have an abdominal x ray before leaving today. Please go to the basement floor to our Radiology department for the test.   Your provider has requested that you go to the basement level for lab work before leaving today. Press "B" on the elevator. The lab is located at the first door on the left as you exit the elevator.   Due to recent changes in healthcare laws, you may see the results of your imaging and laboratory studies on MyChart before your provider has had a chance to review them.  We understand that in some cases there may be results that are confusing or concerning to you. Not all laboratory results come back in the same time frame and the provider may be waiting for multiple results in order to interpret others.  Please give [place] 48 hours in order for your provider to thoroughly review all the results before contacting the office for clarification of your results.    If you are age 13 or older, your body mass index should be between 23-30. Your Body mass index is 28.19 kg/m. If this is out of the aforementioned range listed, please consider follow up with your Primary Care Provider.  If you are age 65 or younger, your body mass index should be between 19-25. Your Body mass index is 28.19 kg/m. If this is out of the aformentioned range listed, please consider follow up with your Primary Care Provider.   ________________________________________________________  The [facility] providers would like to encourage you to use [facility] to communicate with providers for non-urgent requests or questions.  Due to long hold times on the telephone, sending your provider a message by [facility] may be a faster and more efficient way to get a response.  Please allow 48 business hours for a response.  Please remember that this is for non-urgent requests.  _______________________________________________________   [name]  We will send Linzess 72 mcg to your  pharmacy  Add Glycerin Suppositories as needed with colace (Docusate) daily if you have persistent constipation   Thank you for choosing [facility]  Karleen Hampshire [place]and[place],MD

## 2021-11-17 ENCOUNTER — Telehealth: Payer: Self-pay | Admitting: Internal Medicine

## 2021-11-17 NOTE — Telephone Encounter (Signed)
Medication: HYDROcodone-acetaminophen (NORCO) 10-325 MG tablet  Has the patient contacted their pharmacy? Yes.   (If no, request that the patient contact the pharmacy for the refill.) (If yes, when and what did the pharmacy advise?)  Preferred Pharmacy (with phone number or street name):  [facility], [address] AT [address]  [address] [place] [address]  Phone:  [phone]  Fax:  [phone]   Agent: Please be advised that RX refills may take up to 3 business days. We ask that you follow-up with your pharmacy.

## 2021-11-18 MED ORDER — HYDROCODONE-ACETAMINOPHEN 10-325 MG PO TABS: 1.0000 | ORAL_TABLET | Freq: Four times a day (QID) | ORAL | 0 refills | Status: DC | PRN

## 2021-11-18 NOTE — Telephone Encounter (Signed)
Requesting: hydrocodone 10-325mg  Contract: 09/22/2020 UDS: 06/11/2020 Last Visit: 10/28/2021 Next Visit: 02/27/2022 Last Refill: 07/18/2021 #120 and 0RF  Please Advise

## 2021-11-18 NOTE — Telephone Encounter (Signed)
PDMP reviewed, she was prescribed tramadol, see my last visit note, the patient stopped it and is now on hydrocodone again. Okay to refill  it

## 2021-11-21 ENCOUNTER — Ambulatory Visit (INDEPENDENT_AMBULATORY_CARE_PROVIDER_SITE_OTHER): Payer: Medicare Other | Admitting: Pharmacist

## 2021-11-21 DIAGNOSIS — F03B0 Unspecified dementia, moderate, without behavioral disturbance, psychotic disturbance, mood disturbance, and anxiety: Secondary | ICD-10-CM

## 2021-11-21 DIAGNOSIS — Z79899 Other long term (current) drug therapy: Secondary | ICD-10-CM

## 2021-11-21 DIAGNOSIS — E782 Mixed hyperlipidemia: Secondary | ICD-10-CM

## 2021-11-21 DIAGNOSIS — I739 Peripheral vascular disease, unspecified: Secondary | ICD-10-CM

## 2021-11-21 DIAGNOSIS — E1142 Type 2 diabetes mellitus with diabetic polyneuropathy: Secondary | ICD-10-CM

## 2021-11-21 DIAGNOSIS — I2510 Atherosclerotic heart disease of native coronary artery without angina pectoris: Secondary | ICD-10-CM

## 2021-11-21 DIAGNOSIS — I10 Essential (primary) hypertension: Secondary | ICD-10-CM

## 2021-11-21 DIAGNOSIS — M810 Age-related osteoporosis without current pathological fracture: Secondary | ICD-10-CM

## 2021-11-22 ENCOUNTER — Encounter: Payer: Self-pay | Admitting: Gastroenterology

## 2021-11-27 NOTE — Chronic Care Management (AMB) (Signed)
Chronic Care Management Pharmacy Note  11/27/2021 Name:  [name] MRN:  [id number] DOB:  02/05/1935  Summary: Reviewed current medication list with patient and her caregiver, her son. Education provided regarding each medication, proper administration and indication.  Screened for side effects and drug-drug interaction. [name] significant side effects or drug drug interactions suspected.  I did discuss that hydrocodone and similar pain medications could worsen constipation. Patient is taking Linzess for constipation.   Recommendations/Changes made from today's visit: Recommended lower dose of B12 to 5000 mcg every OTHER day since last B12 level was >1500 (07/27/2021). Recheck B12 level with next labs Consider also checking Vitamin D level since patient is taking high dose vitamin D of 5000 units daily (last vitamin D level was >5 years ago)   Subjective: NOELIE Gould is an 87 y.o. year old female who is a primary patient of Paz, Alda Berthold, MD.  The CCM team was consulted for assistance with disease management and care coordination needs.    Engaged with patient by telephone for initial visit in response to provider referral for pharmacy case management and/or care coordination services.   Consent to Services:  The patient was given the following information about Chronic Care Management services today, agreed to services, and gave verbal consent: 1. CCM service includes personalized support from designated clinical staff supervised by the primary care provider, including individualized plan of care and coordination with other care providers 2. 24/7 contact phone numbers for assistance for urgent and routine care needs. 3. Service will only be billed when office clinical staff spend 20 minutes or more in a month to coordinate care. 4. Only one practitioner may furnish and bill the service in a calendar month. 5.The patient may stop CCM services at any time (effective at the end of the month) by  phone call to the office staff. 6. The patient will be responsible for cost sharing (co-pay) of up to 20% of the service fee (after annual deductible is met). Patient agreed to services and consent obtained.  Patient Care Team: Colon Branch, MD as PCP - General Dorothy Spark, MD as Consulting Physician (Cardiology) Melvenia Beam, MD as Consulting Physician (Neurology) Newt Minion, MD as Consulting Physician (Orthopedic Surgery) Elmore Guise, MD as Consulting Physician (Vascular Surgery) Janyth Contes, MD as Consulting Physician (Cardiology) Lonia Skinner, MD as Consulting Physician (Ophthalmology) Festus Aloe, MD as Consulting Physician (Urology) Katy Apo, MD as Consulting Physician (Ophthalmology) Cherre Robins, [place] (Pharmacist)  Recent office visits: 10/28/21-Jose Ladona Horns, MD (PCP) General follow up visit. Increased Aricept to 10 mg nightly. Follow up in 4 months. 07/27/21-Jose Ladona Horns, MD (PCP) Seen for a general follow up visit. Started Aricept 5 mg daily. Flu shot given. Follow up in 3 months. 06/08/21-Jose Ladona Horns, MD (PCP) Hospital follow up visit. Started amoxicillin 875-125 mg for 7 days.    Recent consult visits:  11/15/21-(GI) Dr. Silverio Decamp. Seen for abdominal pain. Stop [name]. Start Linzess 72 mcg and add Glycerin Suppositories as needed with colace (Docusate) daily. 10/10/21-(Orthopedics) Newt Minion, MD Seen for bilateral knee pain. Joint injection given.  09/29/21-(Neurosurgery) Shearon Stalls, PAC. Seen for chronic pain / spinal stenosis. Recently her son did a trial of both tramadol and hydrocodone independently. It appears she has nausea with both medications.Today the patient is continuing to report pain throughout her neck and back. She has been rotating between the tramadol and hydrocodone. Her son states he is not able to determine if  either makes the pain any better than the other. He does not feel that she has any less nausea with one or the  other. They are going to continue to test this idea out at see if she is able to really identify whether not one is better than the other.  09/20/21-(Neurosurgery) Hnery A. Pool, MD - The patient returns today in follow-up. Overall she is progressing well with regard to her anterior cervical decompression and fusion surgery. She is having minimal neck pain. Her upper extremity strength sensation are much improved. She is happy with her progress with regard to her neck. She has chronic stable back pain without radicular symptoms or new weakness. 08/31/21-(Neurosurgery) Shearon Stalls, [facility]. management of her chronic pain. The patient has pain all throughout her cervical and lumbar spine. History of multiple cervical fusions and right below-the-knee amputation. Previously trialed multiple medications to include hydrocodone, morphine, Dilaudid, methadone, gabapentin, Lyrica and Cymbalta. She is currently managed with alpha lipoic acid, amitriptyline and pramipexole prescribed elsewhere. Her primary care physician was prescribing hydrocodone but Dr. Annette Stable sent her to our clinic for pain management options. Unfortunately do not have a lot options for the patient. We tried transitioning her over to tramadol 50 mg to see if this would provide her better coverage from the standpoint of her neuropathic symptoms.Unfortunate the patient has not noticed any significant improvement with the tramadol. However her son states that she has been reporting more nausea. He states he did stop the tramadol and resume the hydrocodone it seems as if the nausea got better. When he resumed the tramadol again he does feel that nausea got worse but he cannot 100% sure. He would like to trial the medication for another month. He would also like to add some additional Tylenol. He she has previously found that helpful.  08/17/21-(Neurology) Dr Jaynee Eagles . Seen for chronic migraines. Emgality samples given. Follow up in 5 months.    Hospital  visits: 09/02/21 ED Visit at [facility] due to Muscle Pain.   New?Medications Started: Diclofenac sodium 1% gel four times daily Medication Changes at Hospital Discharge: None  Medications Discontinued at Hospital Discharge: None noted    06/01/21 to 06/02/2021 admisstion to [facility] due to Back pain.  New?Medications Started at [facility] Discharge:??None noted Medication Changes at Hospital Discharge: None noted Medications Discontinued at Hospital Discharge: None noted  Objective:  Lab Results  Component Value Date   CREATININE 0.96 09/02/2021   CREATININE 1.17 06/08/2021   CREATININE 1.29 (H) 06/01/2021    Lab Results  Component Value Date   HGBA1C 5.6 03/18/2021   Last diabetic Eye exam:  Lab Results  Component Value Date/Time   HMDIABEYEEXA [name] Retinopathy 08/20/2020 12:00 AM    Last diabetic Foot exam:  Lab Results  Component Value Date/Time   HMDIABFOOTEX Dr Herschell Dimes 5 years 06/14/2009 12:00 AM        Component Value Date/Time   CHOL 95 03/18/2021 1502   TRIG 160.0 (H) 03/18/2021 1502   HDL 35.10 (L) 03/18/2021 1502   CHOLHDL 3 03/18/2021 1502   VLDL 32.0 03/18/2021 1502   LDLCALC 28 03/18/2021 1502   LDLDIRECT 44.0 02/28/2019 1304    Hepatic Function Latest Ref Rng & Units 06/01/2021 09/03/2020 08/27/2020  Total Protein 6.5 - 8.1 g/dL 5.8(L) - 5.9(L)  Albumin 3.5 - 5.0 g/dL 3.5 3.8 -  AST 15 - 41 U/L 11(L) 10(A) 10  ALT 0 - 44 U/L 8 8 8   Alk Phosphatase 38 - 126  U/L 37(L) 61 -  Total Bilirubin 0.3 - 1.2 mg/dL 0.6 - 0.6  Bilirubin, Direct 0.0 - 0.3 mg/dL - - -    Lab Results  Component Value Date/Time   TSH 2.11 07/27/2021 03:29 PM   TSH 1.44 09/20/2016 02:36 PM    CBC Latest Ref Rng & Units 09/02/2021 06/01/2021 11/25/2020  WBC 4.0 - 10.5 K/uL 4.3 5.0 4.1  Hemoglobin 12.0 - 15.0 g/dL 11.3(L) 11.5(L) 12.1  Hematocrit 36.0 - 46.0 % 35.2(L) 37.0 38.0  Platelets 150 - 400 K/uL 131(L) 140(L) 172    Lab Results  Component Value  Date/Time   VD25OH 65 10/20/2011 02:44 PM    Clinical ASCVD: Yes  The ASCVD Risk score (Arnett DK, et al., 2019) failed to calculate for the following reasons:   The 2019 ASCVD risk score is only valid for ages 54 to 41     Social History   Tobacco Use  Smoking Status Never  Smokeless Tobacco Never  Tobacco Comments   tried for a few months in college.    BP Readings from Last 3 Encounters:  11/15/21 106/60  10/28/21 126/68  09/02/21 128/68   Pulse Readings from Last 3 Encounters:  11/15/21 72  10/28/21 69  09/02/21 76   Wt Readings from Last 3 Encounters:  11/15/21 159 lb 2 oz (72.2 kg)  09/02/21 156 lb 1.4 oz (70.8 kg)  09/02/21 156 lb (70.8 kg)    Assessment: Review of patient past medical history, allergies, medications, health status, including review of consultants reports, laboratory and other test data, was performed as part of comprehensive evaluation and provision of chronic care management services.   SDOH:  (Social Determinants of Health) assessments and interventions performed:  SDOH Interventions    Flowsheet Row Most Recent Value  SDOH Interventions   Financial Strain Interventions Intervention Not Indicated  Transportation Interventions Intervention Not Indicated       CCM Care Plan  Allergies  Allergen Reactions   Cymbalta [Duloxetine Hcl] Swelling    Swelling in legs   Gabapentin Swelling    Swelling in legs Swelling in legs   Silicone Hives, Itching, Dermatitis and Rash   Metformin And Related Other (See Comments)    dizzy, tired, chills, diarrhea, and nausea   Tramadol Swelling   Adhesive [Tape] Rash    rash rash   Cefazolin Hives    Hives Hives    Ciprofloxacin Other (See Comments)    Other reaction(s): Other (See Comments) Per pt, caused body aches Per pt, caused body aches    Clorazepate Dipotassium Other (See Comments)    Unknown reaction   Dilaudid [Hydromorphone Hcl] Other (See Comments)     confused, intense itching    Doxycycline Rash   Enablex [Darifenacin Hydrobromide Er] Other (See Comments)    Hypotension, near syncope   Levofloxacin Other (See Comments)    Causes wrist pain   Lyrica [Pregabalin] Swelling    Swelling in legs   Methadone Hcl Other (See Comments)    Reaction unknown   Morphine And Related Other (See Comments)    Confusion, constipation.    Talwin [Pentazocine] Other (See Comments)    "climbing walls" anxiety    Medications Reviewed Today     Reviewed by Cherre Robins, RPH-CPP (Pharmacist) on 11/27/21 at 0540  Med List Status: <None>   Medication Order Taking? Sig Documenting Provider Last Dose Status Informant  Alpha-Lipoic Acid 600 MG CAPS [id number] Yes Take 1,200 mg by mouth daily. [provider] Taking  Active Multiple Informants  amitriptyline (ELAVIL) 25 MG tablet [id number] Yes TAKE 1 TABLET(25 MG) BY MOUTH AT BEDTIME Colon Branch, MD Taking Active   Ascorbic Acid (VITAMIN C WITH ROSE HIPS) 500 MG tablet [id number] Yes Take 500 mg by mouth in the morning and at bedtime. [provider] Taking Active Multiple Informants  atorvastatin (LIPITOR) 40 MG tablet [id number] Yes Take 40 mg by mouth daily. [provider] Taking Active Multiple Informants  carvedilol (COREG) 12.5 MG tablet [id number] Yes Take 12.5 mg by mouth 2 (two) times daily with a meal. [provider] Taking Active Multiple Informants  Cholecalciferol (VITAMIN D3) 5000 UNITS CAPS [id number] Yes Take 5,000 Units by mouth every evening. Reported on 11/24/2015 [provider] Taking Active Multiple Informants           Med Note Rosemarie Beath, MELISSA B   Wed Feb 16, 2016  7:01 PM)    CINNAMON PO [id number] Yes Take 2,000 mg by mouth daily. [provider] Taking Active   clopidogrel (PLAVIX) 75 MG tablet [id number] Yes Take 1 tablet (75 mg total) by mouth at bedtime. Restart on 12/04/2020 Viona Gilmore D, NP Taking Active   Cyanocobalamin (VITAMIN B-12) 5000 MCG SUBL [id number]  Yes Place 5,000 mcg under the tongue daily. [provider] Taking Active Multiple Informants  diclofenac Sodium (VOLTAREN) 1 % GEL [id number] Yes Apply 4 g topically 4 (four) times daily.  Patient taking differently: Apply 4 g topically 4 (four) times daily. If needed for pain   Deno Etienne, DO Taking Active   dicyclomine (BENTYL) 10 MG capsule [id number] Yes TAKE 1 CAPSULE THREE TIMES A DAY BEFORE MEALS (NEED APPOINTMENT) Mauri Pole, MD Taking Active   donepezil (ARICEPT) 10 MG tablet [id number] Yes Take 1 tablet (10 mg total) by mouth at bedtime. Colon Branch, MD Taking Active   famotidine (PEPCID) 20 MG tablet [id number] Yes Take 1 tablet (20 mg total) by mouth 2 (two) times daily. Colon Branch, MD Taking Active Multiple Informants  ferrous sulfate (FEROSUL) 325 (65 FE) MG tablet [id number] Yes Take 1 tablet (325 mg total) by mouth 2 (two) times daily with a meal. Colon Branch, MD Taking Active   furosemide (LASIX) 20 MG tablet [id number] Yes Take 20 mg by mouth daily. [provider] Taking Active Multiple Informants  Galcanezumab-gnlm [facility]) 120 MG/ML Darden Palmer [id number] Yes Inject 120 mg into the skin every 30 (thirty) days. Melvenia Beam, MD Taking Active   glucose blood (FREESTYLE LITE) test strip [id number] Yes USE TO CHECK BLOOD SUGAR [name] MORE THAN TWICE DAILY Colon Branch, MD Taking Active Multiple Informants  HYDROcodone-acetaminophen (NORCO) 10-325 MG tablet [id number] Yes Take 1 tablet by mouth every 6 (six) hours as needed. Colon Branch, MD Taking Active   hydrocortisone (ANUSOL-HC) 2.5 % rectal cream [id number] Yes Place 1 application rectally 2 (two) times daily. As needed Mauri Pole, MD Taking Active   hydrocortisone (ANUSOL-HC) 25 MG suppository [id number] Yes Place 1 suppository (25 mg total) rectally 2 (two) times daily. As needed Mauri Pole, MD Taking Active   hydrocortisone 2.5 % cream [id number] [name] Apply topically 2 (two) times daily.  Patient not  taking: Reported on 11/21/2021   Mauri Pole, MD Not Taking Active   Lancets (FREESTYLE) lancets [id number] Yes CHECK BLOOD SUGAR TWICE DAILY Colon Branch, MD Taking Active Multiple Informants  linaclotide [facility]) 72 MCG capsule [id number] Yes Take 1 capsule (72 mcg total) by mouth daily before  breakfast. Mauri Pole, MD Taking Active   loperamide (IMODIUM A-D) 2 MG tablet [id number] Yes Take 2 mg by mouth as needed for diarrhea or loose stools. [provider] Taking Active Multiple Informants  MegaRed Omega-3 [place] [address] [place] CAPS [id number] Yes Restart on 12/04/2020 Viona Gilmore D, NP Taking Active   methenamine (HIPREX) 1 g tablet [id number] Yes Take 0.5 tablets by mouth daily. [provider] Taking Active Multiple Informants  Multiple Vitamins-Minerals (PRESERVISION AREDS 2) CAPS [id number] Yes Take 1 capsule by mouth 2 (two) times daily. [provider] Taking Active Multiple Informants  naloxone [facility]) nasal spray 4 mg/0.1 mL [id number] [name] Place 1 spray into the nose once as needed for up to 1 dose.  Patient not taking: Reported on 11/21/2021   Colon Branch, MD Not Taking Active   nitroGLYCERIN (NITROSTAT) 0.4 MG SL tablet [id number] Yes Place 1 tablet (0.4 mg total) under the tongue every 5 (five) minutes x 3 doses as needed for chest pain. Colon Branch, MD Taking Active            Med Note (CANTER, Vilma Prader D   Fri Jun 11, 2020 10:20 AM) PRN  omeprazole (PRILOSEC) 20 MG capsule [id number] Yes Take 1 capsule (20 mg total) by mouth 2 (two) times daily before a meal. Mauri Pole, MD Taking Active   ondansetron (ZOFRAN) 4 MG tablet [id number] Yes TAKE 1 TABLET(4 MG) BY MOUTH EVERY 8 HOURS AS NEEDED FOR NAUSEA OR VOMITING Colon Branch, MD Taking Active   OVER THE COUNTER MEDICATION [id number] Yes 1 tablet 3 (three) times daily. Berberorine Gluco Defense [provider] Taking Active Multiple Informants  Polyvinyl Alcohol-Povidone (REFRESH OP)  [id number] Yes Place 1 drop into both eyes 3 (three) times daily as needed (dry eyes).  [provider] Taking Active Multiple Informants  pramipexole (MIRAPEX) 0.125 MG tablet [id number] Yes TAKE 1 TO 2 TABLETS(0.125 TO 0.25 MG) BY MOUTH AT BEDTIME AS NEEDED Colon Branch, MD Taking Active   Probiotic Product (PROBIOTIC PEARLS PO) [id number] Yes Take 1 tablet by mouth daily. [provider] Taking Active   rivaroxaban (XARELTO) 2.5 MG TABS tablet [id number] Yes Take 1 tablet (2.5 mg total) by mouth 2 (two) times daily. Restart on 12/04/2020 Viona Gilmore D, NP Taking Active   vitamin E 180 MG (400 UNITS) capsule [id number] Yes Take 400 Units by mouth daily. [provider] Taking Active Multiple Informants            Patient Active Problem List   Diagnosis Date Noted   Dementia ([place]) 10/30/2021   Chronic migraine without aura, with intractable migraine, so stated, with status migrainosus 08/17/2021   Cervical myelopathy ([place]) 11/29/2020   Chronic idiopathic constipation 11/12/2020   Lumbar compression fracture ([place]) 09/29/2020   Other spondylosis with radiculopathy, cervical region 09/29/2020   External hemorrhoids 02/15/2018   History of three vessel coronary artery bypass - 2017 , in [facility] 09/25/2017   PVD (peripheral vascular disease) ([place])-- stent L leg 06-2017 Dr Feliberto Gottron [facility] 07/23/2017   Phantom pain ([place]) 04/01/2017   Constipation, chronic 04/01/2017   S/P unilateral BKA (below knee amputation), right ([place]) 12/14/2016   Toe osteomyelitis, left ([place]) 11/17/2016   Acute on chronic diastolic CHF (congestive heart failure), NYHA class 3 ([place]) 03/30/2016   Coronary artery disease due to lipid rich plaque 03/30/2016   Acute renal failure superimposed on stage 3 chronic kidney disease ([place]) 03/30/2016   S/P below knee amputation ([place]) 02/18/2016  Memory loss 11/26/2015   PCP NOTES >>>>>>>>>>>>>>>>>>>> 10/18/2015   S/P partial colectomy 01/08/2015   Diarrhea  01/29/2014   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 01/28/2014   Anemia 01/18/2014   CAD (coronary artery disease)    Carcinoma in situ in a polyp    Depression 09/17/2013   Charcot's joint 08/21/2013   Foot osteomyelitis, right ([place]) 07/16/2013   Chronic pain associated with significant psychosocial dysfunction 05/06/2013   Neuropathy    Hyperthyroidism    Lower extremity edema 03/08/2011   Annual physical exam 03/08/2011   Tachycardia 12/07/2010   ALLERGIC RHINITIS 04/15/2010   ORTHOSTATIC DIZZINESS 04/13/2010   HIATAL HERNIA 12/06/2009   GASTRIC ULCER, HX OF 12/06/2009   DYSPNEA 03/18/2009   DEGENERATIVE JOINT DISEASE 06/03/2008   COLONIC POLYPS 01/27/2008   DIVERTICULOSIS, COLON 01/27/2008   IRRITABLE BOWEL SYNDROME, HX OF 01/27/2008   Pulmonary nodule, right 01/22/2008   Hyperlipidemia 12/30/2007   Osteoporosis 12/30/2007   TB SKIN TEST, POSITIVE 08/09/2007   DM type 2 with diabetic peripheral neuropathy ([place]) 05/09/2007   Reflex sympathetic dystrophy-- PAIN MNGMT  05/09/2007   Essential hypertension 04/15/2007   GERD 04/15/2007    Immunization History  Administered Date(s) Administered   Fluad Quad(high Dose 65+) 07/11/2019, 09/22/2020, 07/27/2021   Influenza Split 08/07/2012   Influenza Whole 07/22/2008, 07/26/2009   Influenza, High Dose Seasonal PF 10/15/2015, 09/18/2016, 07/23/2017, 08/21/2018   Influenza, Seasonal, Injecte, Preservative Fre 07/23/2013   Influenza,inj,Quad PF,6+ Mos 10/06/2014   PFIZER(Purple Top)SARS-COV-2 Vaccination 12/27/2019, 01/31/2020, 11/01/2020   Pneumococcal Conjugate-13 10/06/2014   Pneumococcal Polysaccharide-23 09/28/2010   Tetanus 09/17/2013   Zoster Recombinat (Shingrix) 10/11/2018, 12/12/2018   Zoster, Live 09/18/2011    Conditions to be addressed/monitored: CHF, CAD, HTN, HLD, DMII, Dementia, Osteoporosis, and poly pharmacy; neuropathy; constipation; chronic pain  There are [name] care plans that you recently  modified to display for this patient.   Medication Assistance: None required.  Patient affirms current coverage meets needs.  Patient's preferred pharmacy is:  [place], [place] [address] [place] [address] Phone: [phone] Fax: [place] [address], [address] AT [facility] OF [address] [place] [address] Phone: [phone] Fax: [phone]    Follow Up:  Patient agrees to Care Plan and Follow-up.  Plan: Telephone follow up appointment with care management team member scheduled for:  1 to 2 months  Cherre Robins, PharmD Clinical Pharmacist [facility] [place] [facility] [place]

## 2021-11-27 NOTE — Patient Instructions (Signed)
[name],  It was a pleasure speaking with you and your son today.  I have attached a summary of our visit today and information about your health goals at the bottom of this message. Below are the main points of our visit:  Patient Goals/Self-Care Activities take medications as prescribed,  check glucose weekly, document, and provide at future appointments,  weigh daily, and contact provider if weight gain of more than 3lbs in 24 hours or 5lbs in 1 week, and  lower dose of vitamin B12 to 5044mcg every OTHER day.   Our next appointment is by telephone on Mar 13, 2022 at 2pm  Please call the care guide team at [phone] if you need to cancel or reschedule your appointment.   If you have any questions or concerns, please feel free to contact me either at the phone number below or with a MyChart message.    Cherre Robins, PharmD Clinical Pharmacist [facility] [phone] (direct line)  [phone] (main office number)    Diabetes: Controlled: LDL goal < 7.0%  Lab Results  Component Value Date   HGBA1C 5.6 03/18/2021   Current treatment: Cinnamon 4000mg  daily   Interventions:  Continue to check blood glucose once weekly Reviewed dose of cinnamon Reviewed home blood glucose goals  Fasting blood glucose goal (before meals) = 80 to 130 Blood glucose goal after a meal = less than 180   High blood pressure / heart disease / elevated cholesterol  Controlled; LDL goal < 55 and blood pressure goal < 140/90  BP Readings from Last 3 Encounters:  11/15/21 106/60  10/28/21 126/68  09/02/21 128/68   Lab Results  Component Value Date   CHOL 95 03/18/2021   HDL 35.10 (L) 03/18/2021   LDLCALC 28 03/18/2021   LDLDIRECT 44.0 02/28/2019   TRIG 160.0 (H) 03/18/2021   CHOLHDL 3 03/18/2021   Current treatment: Xarelto 2.5mg  twice a day Clopidogrel 75mg  daily Nitroglycerin 0.4mg  as needed for chest pain Atorvastatin 40mg  daily  Carvedilol  12.5mg  twice a day Furosemide 20mg  daily  Interventions:  Dicussed LDL goal Discussed signs and symptoms of CHF exacerbation - weight gain, SOB, abdominal fullness, swelling in legs or abdomen, Fatigue and weakness, changes in ability to perform usual activities, persistent cough or wheezing with white or pink blood-tinged mucus, nausea and lack of appetite. Contact cardiologist or primary care provider if you notice these symptoms Continue to weigh daily - report weight gain of more than 3 lbs in 24 hours or 5 lbs in 1 week. Continue current medications  Osteoporosis:  Goal: prevent falls and fractures Current therapy:  Vitamin D 5000IU daily Prolia 60mg  injected subcutaneously every 6 months (last dose 08/16/2021) Last Done Density 03/08/2021 at [facility]: T-Score for total right hip = -2.2 (previous score 01/08/2017 was -3.3) - improved T-Score for total left hip = -1.3 (previous score 01/08/2017 was -1.7) - improved T-Score for left radius = -3.4 (previous score 01/08/2017 was -3.2) - slightly decreased Interventions:  Consider rechecking serum vitamin D with next labs since patient is on high dose vitamin D Supplement Continue Prolia injections every 6 months Fall prevention  Memory Changes: Goal: slow progression of memory loss Current therapy:  Donepezil 10mg  at bedtime B12 supplement - 5095mcg once daily Last serum B12 level was > 1500.  Interventions:  Recommended decrease B12 supplement to 5032mcg every OTHER day Check serum B12 at next visit.  Continue donepezil  Medication management Goal: maintain optimal medication adherence Interventions  Comprehensive medication review performed. Reviewed refill history and assessed adherence Assessed for side effects and drug-drug interactions Provided education to patient and caregiver regarding medication, indications and proper administration   Patient Goals/Self-Care Activities Over the next 90 days, patient will:  take  medications as prescribed,  check glucose weekly, document, and provide at future appointments,  weigh daily, and contact provider if weight gain of more than 3lbs in 24 hours or 5lbs in 1 week, and  lower dose of vitamin B12 to 5063mcg every OTHER day.   Follow Up Plan: Telephone follow up appointment with care management team member scheduled for:  1 to 2 months   Patient verbalizes understanding of instructions and care plan provided today and agrees to view in [place]. Active MyChart status confirmed with patient.

## 2021-11-28 ENCOUNTER — Encounter: Payer: Self-pay | Admitting: Orthopedic Surgery

## 2021-11-29 ENCOUNTER — Other Ambulatory Visit: Payer: Self-pay

## 2021-11-29 ENCOUNTER — Ambulatory Visit (INDEPENDENT_AMBULATORY_CARE_PROVIDER_SITE_OTHER): Payer: Medicare Other | Admitting: Family

## 2021-11-29 DIAGNOSIS — L97421 Non-pressure chronic ulcer of left heel and midfoot limited to breakdown of skin: Secondary | ICD-10-CM

## 2021-11-29 DIAGNOSIS — I2510 Atherosclerotic heart disease of native coronary artery without angina pectoris: Secondary | ICD-10-CM

## 2021-11-29 MED ORDER — SILVER SULFADIAZINE 1 % EX CREA: 1.0000 "application " | TOPICAL_CREAM | Freq: Every day | CUTANEOUS | 0 refills | Status: DC

## 2021-11-29 NOTE — Progress Notes (Signed)
Office Visit Note   Patient: [name]           Date of Birth: 1934-11-15           MRN: [id number] Visit Date: 11/29/2021              Requested by: Colon Branch, [address] [place],  [place] [address] PCP: Colon Branch, MD  Chief Complaint  Patient presents with   Left Foot - Wound Check      HPI: The patient is an 87 year old woman who presents today for concern of a new blister on the back of her left heel.  She lives with her son who accompanies the visit he has been trying to figure out how this blister occurred she has been scooting around in her wheelchair using her feet to propel herself recently she also slides her foot and shoe quite frequently.  She has had issues with worse edema over recent weeks however this has been resolving over the last 1 week with some changes to her medications  She had what was a fluid-filled blister on the back of her heel which has now degloved and she is having significant clear drainage  No systemic symptoms  Assessment & Plan: Visit Diagnoses: No diagnosis found.  Plan: She will begin Silvadene dressing changes.  Provided a PRAFO to offload the heel discussed offloading this area until wound heals  Follow-Up Instructions: No follow-ups on file.   Ortho Exam  Patient is alert, oriented, no adenopathy, well-dressed, normal affect, normal respiratory effort.  Stable edema to the left lower extremity.  Without weeping or cellulitis On examination of the left foot she does have a significant ulcer to the posterior heel encompassing the entire posterior heel extending plantarly.  With some surrounding maceration the wound is coming completely filled in with granulation tissue.  There is no active drainage no surrounding erythema or warmth  Imaging: No results found. No images are attached to the encounter.  Labs: Lab Results  Component Value Date   HGBA1C 5.6 03/18/2021   HGBA1C 6.1 (H) 12/17/2020   HGBA1C  8.1 (H) 08/27/2020   ESRSEDRATE 9 06/12/2014   ESRSEDRATE 12 05/13/2014   REPTSTATUS 06/04/2021 FINAL 06/02/2021   CULT >=100,000 COLONIES/mL ESCHERICHIA COLI (A) 06/02/2021   LABORGA ESCHERICHIA COLI (A) 06/02/2021     Lab Results  Component Value Date   ALBUMIN 3.5 06/01/2021   ALBUMIN 3.8 09/03/2020   ALBUMIN 4.2 12/24/2019    Lab Results  Component Value Date   MG 1.5 03/30/2016   Lab Results  Component Value Date   VD25OH 65 10/20/2011    No results found for: PREALBUMIN CBC EXTENDED Latest Ref Rng & Units 09/02/2021 06/01/2021 11/25/2020  WBC 4.0 - 10.5 K/uL 4.3 5.0 4.1  RBC 3.87 - 5.11 MIL/uL 3.62(L) 3.86(L) 4.08  HGB 12.0 - 15.0 g/dL 11.3(L) 11.5(L) 12.1  HCT 36.0 - 46.0 % 35.2(L) 37.0 38.0  PLT 150 - 400 K/uL 131(L) 140(L) 172  NEUTROABS 1.7 - 7.7 K/uL 2.8 3.6 2.0  LYMPHSABS 0.7 - 4.0 K/uL 1.2 1.0 1.6     There is no height or weight on file to calculate BMI.  Orders:  No orders of the defined types were placed in this encounter.  Meds ordered this encounter  Medications   silver sulfADIAZINE (SILVADENE) 1 % cream    Sig: Apply 1 application topically daily.    Dispense:  50 g  Refill:  0     Procedures: No procedures performed  Clinical Data: No additional findings.  ROS:  All other systems negative, except as noted in the HPI. Review of Systems  Objective: Vital Signs: There were no vitals taken for this visit.  Specialty Comments:  No specialty comments available.  PMFS History: Patient Active Problem List   Diagnosis Date Noted   Dementia ([place]) 10/30/2021   Chronic migraine without aura, with intractable migraine, so stated, with status migrainosus 08/17/2021   Cervical myelopathy (HCC) 11/29/2020   Chronic idiopathic constipation 11/12/2020   Lumbar compression fracture ([place]) 09/29/2020   Other spondylosis with radiculopathy, cervical region 09/29/2020   External hemorrhoids 02/15/2018   History of three vessel coronary artery  bypass - 2017 , in [facility] 09/25/2017   PVD (peripheral vascular disease) ([place])-- stent L leg 06-2017 Dr Feliberto [facility] 07/23/2017   Phantom pain ([place]) 04/01/2017   Constipation, chronic 04/01/2017   S/P unilateral BKA (below knee amputation), right ([place]) 12/14/2016   Toe osteomyelitis, left ([place]) 11/17/2016   Acute on chronic diastolic CHF (congestive heart failure), NYHA class 3 ([place]) 03/30/2016   Coronary artery disease due to lipid rich plaque 03/30/2016   Acute renal failure superimposed on stage 3 chronic kidney disease ([place]) 03/30/2016   S/P below knee amputation ([place]) 02/18/2016   Memory loss 11/26/2015   PCP NOTES >>>>>>>>>>>>>>>>>>>> 10/18/2015   S/P partial colectomy 01/08/2015   Diarrhea 01/29/2014   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 01/28/2014   Anemia 01/18/2014   CAD (coronary artery disease)    Carcinoma in situ in a polyp    Depression 09/17/2013   Charcot's joint 08/21/2013   Foot osteomyelitis, right ([place]) 07/16/2013   Chronic pain associated with significant psychosocial dysfunction 05/06/2013   Neuropathy    Hyperthyroidism    Lower extremity edema 03/08/2011   Annual physical exam 03/08/2011   Tachycardia 12/07/2010   ALLERGIC RHINITIS 04/15/2010   ORTHOSTATIC DIZZINESS 04/13/2010   HIATAL HERNIA 12/06/2009   GASTRIC ULCER, HX OF 12/06/2009   DYSPNEA 03/18/2009   DEGENERATIVE JOINT DISEASE 06/03/2008   COLONIC POLYPS 01/27/2008   DIVERTICULOSIS, COLON 01/27/2008   IRRITABLE BOWEL SYNDROME, HX OF 01/27/2008   Pulmonary nodule, right 01/22/2008   Hyperlipidemia 12/30/2007   Osteoporosis 12/30/2007   TB SKIN TEST, POSITIVE 08/09/2007   DM type 2 with diabetic peripheral neuropathy ([place]) 05/09/2007   Reflex sympathetic dystrophy-- PAIN MNGMT  05/09/2007   Essential hypertension 04/15/2007   GERD 04/15/2007   Past Medical History:  Diagnosis Date   Allergic rhinitis    Arthritis    "back; ankles; hands; knees" (10/31/2013)   Bilateral  sensorineural hearing loss    CAD (coronary artery disease)    a. Nonobst in 2011. b. Abnormal nuc 09/2013 -> s/p cutting balloon to D2, mild LAD disease; c. 08/2015 MV: no ischemia, EF 71%.   Carcinoma in situ in a polyp 1994   a. 1994 - malignant polyp removed during colonoscopy.   Chronic diastolic CHF (congestive heart failure) ([place])    a. 09/2013 Echo: EF 60-65%, mild LVH, Gr1 DD.   Dementia ([place])    Diverticulosis    DJD (degenerative joint disease)    Fatty liver    GERD (gastroesophageal reflux disease)    a. Hx GERD/esophageal dysmotility followed by Dr. Olevia Perches.    Hemorrhoids    Hiatal hernia    a. s/p Nissen fundoplication 0737.   Hyperlipidemia    a. patient unwilling to use statins.  Hypertensive heart disease    IBS (irritable bowel syndrome)    Macular degeneration 03/2009   Dr. Rosana Hoes   Neuropathy    a. Hands, feet, legs.   Orthostatic hypotension    Osteomyelitis (HCC)    a. 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 01/2016 s/p RLE transtibial amputation 2/2 Charcot rocker-bottom deformity and insensate neuropathy ulceration.   Osteoporosis    PE (pulmonary embolism) 12/2007   a. PE/DVT after neck surgery 2009. b. coumadin d/c 10-2008.   PONV (postoperative nausea and vomiting) 2009   neck surgery   PPD positive    Recurrent UTI    RSD (reflex sympathetic dystrophy)    a. Chronic pain.   Spinal stenosis    Type II diabetes mellitus ([place])    no on medication   Venous insufficiency    a. Contributing to LEE.    Family History  Problem Relation Age of Onset   Heart disease Mother        mitral valve replaced   Arthritis Mother    Diabetic kidney disease Daughter    Diabetes Father    Stroke Father    Migraines Sister    Hypertension Other    Breast cancer Other    Colon cancer Neg Hx    Esophageal cancer Neg Hx    Stomach cancer Neg Hx    Rectal cancer Neg Hx     Past Surgical History:   Procedure Laterality Date   AMPUTATION  03/06/2012   Procedure: AMPUTATION FOOT;  Surgeon: Newt Minion, MD;  Location: [place];  Service: Orthopedics;  Laterality: Left;  FIFTH RAY AMPUTATION    AMPUTATION Right 02/18/2016   Procedure: AMPUTATION BELOW KNEE;  Surgeon: Newt Minion, MD;  Location: [place];  Service: Orthopedics;  Laterality: Right;   AMPUTATION Left 11/22/2016   Procedure: Amputation 4th Toe Left Foot at Metatarsophalangeal Joint;  Surgeon: Newt Minion, MD;  Location: [place];  Service: Orthopedics;  Laterality: Left;   ANKLE FUSION  09/27/2012   Procedure: ANKLE FUSION;  Surgeon: Newt Minion, MD;  Location: [place];  Service: Orthopedics;  Laterality: Left;  Left Tibiocalcaneal Fusion   ANKLE FUSION Right 05/09/2013   Procedure: ANKLE FUSION;  Surgeon: Newt Minion, MD;  Location: [place];  Service: Orthopedics;  Laterality: Right;  Excision Osteomyelitis Base 1st MT Right Foot, Fusion Medial Column   ANTERIOR CERVICAL DECOMP/DISCECTOMY FUSION  12/18/07   For OA,  Dr. Lorin Mercy:  fu by a PE   ANTERIOR CERVICAL DECOMP/DISCECTOMY FUSION N/A 11/29/2020   Procedure: Anterior Cervical Decompression Fusion - Cervical Three-Cerivcal Four;  Surgeon: Earnie Larsson, MD;  Location: [place];  Service: Neurosurgery;  Laterality: N/A;  3C   CARDIAC CATHETERIZATION  05/2010    at [place] Left 09/2011   CATARACT EXTRACTION W/ INTRAOCULAR LENS  IMPLANT, BILATERAL  2004   feb 2004 left, aug 2004 right   CHOLECYSTECTOMY  03/2002   CORONARY ANGIOPLASTY  10/31/2013   CORONARY ARTERY BYPASS GRAFT  09/27/2016   CYST REMOVAL HAND  06/2003   FOOT BONE EXCISION Right 06/2009   LAPAROSCOPIC RIGHT HEMI COLECTOMY N/A 01/08/2015   Procedure: LAPAROSCOPIC ASSISTED RIGHT HEMI COLECTOMY;  Surgeon: Johnathan Hausen, MD;  Location: [facility];  Service: General;  Laterality: N/A;   LEFT HEART CATHETERIZATION WITH CORONARY ANGIOGRAM N/A 10/31/2013   Procedure: LEFT HEART CATHETERIZATION WITH CORONARY ANGIOGRAM;   Surgeon: Conception Oms  Hassell Done, MD;  Location: [facility] C[facility] LAB;  Service: Cardiovascular;  Laterality: N/A;   NASAL SEPTUM SURGERY  54/0981   NISSEN FUNDOPLICATION  10/31/1476   PERCUTANEOUS CORONARY INTERVENTION-BALLOON ONLY  10/31/2013   Procedure: PERCUTANEOUS CORONARY INTERVENTION-BALLOON ONLY;  Surgeon: Jettie Booze, MD;  Location: [facility] CATH LAB;  Service: Cardiovascular;;   ROTATOR CUFF REPAIR Right 07/2007   Dr. Morene Rankins CUFF REPAIR Left 11/2004   stent in left leg, behind knee  06/27/2017   x2, done at [facility] cardiology in [place] Right 08/2008   3rd toe, Dr. Sharol Given due to osteomyelitis   VAGINAL HYSTERECTOMY  03/1975   VEIN LIGATION Bilateral 03/1966   VENA CAVA FILTER PLACEMENT  01/2010   green filter; "due to blood clots"   WISDOM TOOTH EXTRACTION  06/2007   Social History   Occupational History   Occupation: Retired     Comment: from social services   Tobacco Use   Smoking status: Never   Smokeless tobacco: Never   Tobacco comments:    tried for a few months in college.   Vaping Use   Vaping Use: Never used  Substance and Sexual Activity   Alcohol use: No    Alcohol/week: 0.0 standard drinks   Drug use: No   Sexual activity: Not Currently    Birth control/protection: Post-menopausal

## 2021-11-30 ENCOUNTER — Encounter: Payer: Self-pay | Admitting: Family

## 2021-12-06 ENCOUNTER — Telehealth: Payer: Self-pay | Admitting: Internal Medicine

## 2021-12-06 MED ORDER — FAMOTIDINE 20 MG PO TABS: 20.0000 mg | ORAL_TABLET | Freq: Two times a day (BID) | ORAL | 1 refills | Status: DC

## 2021-12-06 NOTE — Telephone Encounter (Signed)
Rx sent 

## 2021-12-06 NOTE — Telephone Encounter (Signed)
Medication: famotidine (PEPCID) 20 MG tablet   Has the patient contacted their pharmacy? Yes.     Preferred Pharmacy: [facility], [place]  [address] [place] [address]  Phone:  [phone]  Fax:  [phone]

## 2021-12-09 ENCOUNTER — Encounter: Payer: Self-pay | Admitting: Gastroenterology

## 2021-12-09 ENCOUNTER — Encounter: Payer: Self-pay | Admitting: Neurology

## 2021-12-13 ENCOUNTER — Ambulatory Visit (INDEPENDENT_AMBULATORY_CARE_PROVIDER_SITE_OTHER): Payer: Medicare Other | Admitting: Family

## 2021-12-13 ENCOUNTER — Other Ambulatory Visit: Payer: Self-pay

## 2021-12-13 DIAGNOSIS — L97421 Non-pressure chronic ulcer of left heel and midfoot limited to breakdown of skin: Secondary | ICD-10-CM | POA: Diagnosis not present

## 2021-12-13 DIAGNOSIS — I2510 Atherosclerotic heart disease of native coronary artery without angina pectoris: Secondary | ICD-10-CM

## 2021-12-14 ENCOUNTER — Encounter: Payer: Self-pay | Admitting: Family

## 2021-12-14 NOTE — Progress Notes (Signed)
Office Visit Note   Patient: [name]           Date of Birth: October 12, 1935           MRN: [id number] Visit Date: 12/13/2021              Requested by: Colon Branch, [address] [place],  [place] [address] PCP: Colon Branch, MD  Chief Complaint  Patient presents with   Left Foot - Wound Check      HPI: The patient is an 87 year old woman who presents today in follow-up for ulcer to her left heel.  She has been doing Silvadene dressing changes daily she has been using a PRAFO.  She does complain that the [facility] has broken down she is having difficulty walking in this boot.  She has custom orthotics and extra-depth shoes with a double upright brace but she and her son are concerned about wearing this they are afraid that the shoewear she does have has caused her ulcer so they do not want to resume the shoe wear until they can have new inserts and shoes fabricated   No systemic symptoms  Assessment & Plan: Visit Diagnoses: No diagnosis found.  Plan: She we will continue Silvadene dressing changes.  Continue offloading the heel may use the PRAFO at rest.  Discussed using regular shoewear offered a postop shoe for ambulation Follow-Up Instructions: Return in about 2 weeks (around 12/27/2021).   Ortho Exam  Patient is alert, oriented, no adenopathy, well-dressed, normal affect, normal respiratory effort.  Stable edema to the left lower extremity.  Without weeping or cellulitis On examination of the left foot she does have an ulcer to the posterior heel this is about 50% decreased in size from last viewing is now 16 mm x 3 cm and completely filled in with granulation there is mild maceration no surrounding erythema no drainage no warmth    Imaging: No results found. No images are attached to the encounter.  Labs: Lab Results  Component Value Date   HGBA1C 5.6 03/18/2021   HGBA1C 6.1 (H) 12/17/2020   HGBA1C 8.1 (H) 08/27/2020   ESRSEDRATE 9 06/12/2014    ESRSEDRATE 12 05/13/2014   REPTSTATUS 06/04/2021 FINAL 06/02/2021   CULT >=100,000 COLONIES/mL ESCHERICHIA COLI (A) 06/02/2021   LABORGA ESCHERICHIA COLI (A) 06/02/2021     Lab Results  Component Value Date   ALBUMIN 3.5 06/01/2021   ALBUMIN 3.8 09/03/2020   ALBUMIN 4.2 12/24/2019    Lab Results  Component Value Date   MG 1.5 03/30/2016   Lab Results  Component Value Date   VD25OH 65 10/20/2011    No results found for: PREALBUMIN CBC EXTENDED Latest Ref Rng & Units 09/02/2021 06/01/2021 11/25/2020  WBC 4.0 - 10.5 K/uL 4.3 5.0 4.1  RBC 3.87 - 5.11 MIL/uL 3.62(L) 3.86(L) 4.08  HGB 12.0 - 15.0 g/dL 11.3(L) 11.5(L) 12.1  HCT 36.0 - 46.0 % 35.2(L) 37.0 38.0  PLT 150 - 400 K/uL 131(L) 140(L) 172  NEUTROABS 1.7 - 7.7 K/uL 2.8 3.6 2.0  LYMPHSABS 0.7 - 4.0 K/uL 1.2 1.0 1.6     There is no height or weight on file to calculate BMI.  Orders:  No orders of the defined types were placed in this encounter.  No orders of the defined types were placed in this encounter.    Procedures: No procedures performed  Clinical Data: No additional findings.  ROS:  All other systems negative, except as  noted in the HPI. Review of Systems  Objective: Vital Signs: There were no vitals taken for this visit.  Specialty Comments:  No specialty comments available.  PMFS History: Patient Active Problem List   Diagnosis Date Noted   Dementia ([place]) 10/30/2021   Chronic migraine without aura, with intractable migraine, so stated, with status migrainosus 08/17/2021   Cervical myelopathy (HCC) 11/29/2020   Chronic idiopathic constipation 11/12/2020   Lumbar compression fracture ([place]) 09/29/2020   Other spondylosis with radiculopathy, cervical region 09/29/2020   External hemorrhoids 02/15/2018   History of three vessel coronary artery bypass - 2017 , in [facility] 09/25/2017   PVD (peripheral vascular disease) ([place])-- stent L leg 06-2017 Dr Feliberto [facility] 07/23/2017   Phantom pain ([place]) 04/01/2017    Constipation, chronic 04/01/2017   S/P unilateral BKA (below knee amputation), right ([place]) 12/14/2016   Toe osteomyelitis, left ([place]) 11/17/2016   Acute on chronic diastolic CHF (congestive heart failure), NYHA class 3 ([place]) 03/30/2016   Coronary artery disease due to lipid rich plaque 03/30/2016   Acute renal failure superimposed on stage 3 chronic kidney disease ([place]) 03/30/2016   S/P below knee amputation ([place]) 02/18/2016   Memory loss 11/26/2015   PCP NOTES >>>>>>>>>>>>>>>>>>>> 10/18/2015   S/P partial colectomy 01/08/2015   Diarrhea 01/29/2014   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 01/28/2014   Anemia 01/18/2014   CAD (coronary artery disease)    Carcinoma in situ in a polyp    Depression 09/17/2013   Charcot's joint 08/21/2013   Foot osteomyelitis, right ([place]) 07/16/2013   Chronic pain associated with significant psychosocial dysfunction 05/06/2013   Neuropathy    Hyperthyroidism    Lower extremity edema 03/08/2011   Annual physical exam 03/08/2011   Tachycardia 12/07/2010   ALLERGIC RHINITIS 04/15/2010   ORTHOSTATIC DIZZINESS 04/13/2010   HIATAL HERNIA 12/06/2009   GASTRIC ULCER, HX OF 12/06/2009   DYSPNEA 03/18/2009   DEGENERATIVE JOINT DISEASE 06/03/2008   COLONIC POLYPS 01/27/2008   DIVERTICULOSIS, COLON 01/27/2008   IRRITABLE BOWEL SYNDROME, HX OF 01/27/2008   Pulmonary nodule, right 01/22/2008   Hyperlipidemia 12/30/2007   Osteoporosis 12/30/2007   TB SKIN TEST, POSITIVE 08/09/2007   DM type 2 with diabetic peripheral neuropathy ([place]) 05/09/2007   Reflex sympathetic dystrophy-- PAIN MNGMT  05/09/2007   Essential hypertension 04/15/2007   GERD 04/15/2007   Past Medical History:  Diagnosis Date   Allergic rhinitis    Arthritis    "back; ankles; hands; knees" (10/31/2013)   Bilateral sensorineural hearing loss    CAD (coronary artery disease)    a. Nonobst in 2011. b. Abnormal nuc 09/2013 -> s/p cutting balloon to D2, mild LAD disease; c.  08/2015 MV: no ischemia, EF 71%.   Carcinoma in situ in a polyp 1994   a. 1994 - malignant polyp removed during colonoscopy.   Chronic diastolic CHF (congestive heart failure) ([place])    a. 09/2013 Echo: EF 60-65%, mild LVH, Gr1 DD.   Dementia ([place])    Diverticulosis    DJD (degenerative joint disease)    Fatty liver    GERD (gastroesophageal reflux disease)    a. Hx GERD/esophageal dysmotility followed by Dr. Olevia Perches.    Hemorrhoids    Hiatal hernia    a. s/p Nissen fundoplication 5732.   Hyperlipidemia    a. patient unwilling to use statins.   Hypertensive heart disease    IBS (irritable bowel syndrome)    Macular degeneration 03/2009   Dr. Rosana Hoes   Neuropathy  a. Hands, feet, legs.   Orthostatic hypotension    Osteomyelitis (HCC)    a. 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 01/2016 s/p RLE transtibial amputation 2/2 Charcot rocker-bottom deformity and insensate neuropathy ulceration.   Osteoporosis    PE (pulmonary embolism) 12/2007   a. PE/DVT after neck surgery 2009. b. coumadin d/c 10-2008.   PONV (postoperative nausea and vomiting) 2009   neck surgery   PPD positive    Recurrent UTI    RSD (reflex sympathetic dystrophy)    a. Chronic pain.   Spinal stenosis    Type II diabetes mellitus ([place])    no on medication   Venous insufficiency    a. Contributing to LEE.    Family History  Problem Relation Age of Onset   Heart disease Mother        mitral valve replaced   Arthritis Mother    Diabetic kidney disease Daughter    Diabetes Father    Stroke Father    Migraines Sister    Hypertension Other    Breast cancer Other    Colon cancer Neg Hx    Esophageal cancer Neg Hx    Stomach cancer Neg Hx    Rectal cancer Neg Hx     Past Surgical History:  Procedure Laterality Date   AMPUTATION  03/06/2012   Procedure: AMPUTATION FOOT;  Surgeon: Newt Minion, MD;  Location: [facility];  Service: Orthopedics;  Laterality:  Left;  FIFTH RAY AMPUTATION    AMPUTATION Right 02/18/2016   Procedure: AMPUTATION BELOW KNEE;  Surgeon: Newt Minion, MD;  Location: [place];  Service: Orthopedics;  Laterality: Right;   AMPUTATION Left 11/22/2016   Procedure: Amputation 4th Toe Left Foot at Metatarsophalangeal Joint;  Surgeon: Newt Minion, MD;  Location: [place];  Service: Orthopedics;  Laterality: Left;   ANKLE FUSION  09/27/2012   Procedure: ANKLE FUSION;  Surgeon: Newt Minion, MD;  Location: [place];  Service: Orthopedics;  Laterality: Left;  Left Tibiocalcaneal Fusion   ANKLE FUSION Right 05/09/2013   Procedure: ANKLE FUSION;  Surgeon: Newt Minion, MD;  Location: [facility];  Service: Orthopedics;  Laterality: Right;  Excision Osteomyelitis Base 1st MT Right Foot, Fusion Medial Column   ANTERIOR CERVICAL DECOMP/DISCECTOMY FUSION  12/18/07   For OA,  Dr. Lorin Mercy:  fu by a PE   ANTERIOR CERVICAL DECOMP/DISCECTOMY FUSION N/A 11/29/2020   Procedure: Anterior Cervical Decompression Fusion - Cervical Three-Cerivcal Four;  Surgeon: Earnie Larsson, MD;  Location: [place];  Service: Neurosurgery;  Laterality: N/A;  3C   CARDIAC CATHETERIZATION  05/2010    at [place] Left 09/2011   CATARACT EXTRACTION W/ INTRAOCULAR LENS  IMPLANT, BILATERAL  2004   feb 2004 left, aug 2004 right   CHOLECYSTECTOMY  03/2002   CORONARY ANGIOPLASTY  10/31/2013   CORONARY ARTERY BYPASS GRAFT  09/27/2016   CYST REMOVAL HAND  06/2003   FOOT BONE EXCISION Right 06/2009   LAPAROSCOPIC RIGHT HEMI COLECTOMY N/A 01/08/2015   Procedure: LAPAROSCOPIC ASSISTED RIGHT HEMI COLECTOMY;  Surgeon: Johnathan Hausen, MD;  [facility];  Service: General;  Laterality: N/A;   LEFT HEART CATHETERIZATION WITH CORONARY ANGIOGRAM N/A 10/31/2013   Procedure: LEFT HEART CATHETERIZATION WITH CORONARY ANGIOGRAM;  Surgeon: Jettie Booze, MD;  Location: [facility] CATH LAB;  Service: Cardiovascular;  Laterality: N/A;   NASAL SEPTUM SURGERY  08/314   NISSEN FUNDOPLICATION  01/25/2010   PERCUTANEOUS CORONARY INTERVENTION-BALLOON ONLY  10/31/2013   Procedure: PERCUTANEOUS CORONARY INTERVENTION-BALLOON ONLY;  Surgeon: Jettie Booze, MD;  Location: [facility] CATH LAB;  Service: Cardiovascular;;   ROTATOR CUFF REPAIR Right 07/2007   Dr. Morene Rankins CUFF REPAIR Left 11/2004   stent in left leg, behind knee  06/27/2017   x2, done at [facility] cardiology in [place] Right 08/2008   3rd toe, Dr. Sharol Given due to osteomyelitis   VAGINAL HYSTERECTOMY  03/1975   VEIN LIGATION Bilateral 03/1966   VENA CAVA FILTER PLACEMENT  01/2010   green filter; "due to blood clots"   WISDOM TOOTH EXTRACTION  06/2007   Social History   Occupational History   Occupation: Retired     Comment: from social services   Tobacco Use   Smoking status: Never   Smokeless tobacco: Never   Tobacco comments:    tried for a few months in college.   Vaping Use   Vaping Use: Never used  Substance and Sexual Activity   Alcohol use: No    Alcohol/week: 0.0 standard drinks   Drug use: No   Sexual activity: Not Currently    Birth control/protection: Post-menopausal

## 2021-12-21 DIAGNOSIS — M4802 Spinal stenosis, cervical region: Secondary | ICD-10-CM | POA: Diagnosis not present

## 2021-12-21 DIAGNOSIS — Z6827 Body mass index (BMI) 27.0-27.9, adult: Secondary | ICD-10-CM | POA: Diagnosis not present

## 2021-12-23 ENCOUNTER — Encounter: Payer: Self-pay | Admitting: Internal Medicine

## 2021-12-23 ENCOUNTER — Other Ambulatory Visit: Payer: Medicare Other

## 2021-12-23 DIAGNOSIS — A09 Infectious gastroenteritis and colitis, unspecified: Secondary | ICD-10-CM

## 2021-12-23 DIAGNOSIS — K5900 Constipation, unspecified: Secondary | ICD-10-CM | POA: Diagnosis not present

## 2021-12-25 LAB — GI PROFILE, STOOL, PCR

## 2021-12-26 ENCOUNTER — Telehealth: Payer: Self-pay

## 2021-12-26 ENCOUNTER — Other Ambulatory Visit: Payer: Self-pay

## 2021-12-26 MED ORDER — VANCOMYCIN HCL 125 MG PO CAPS: 125.0000 mg | ORAL_CAPSULE | Freq: Four times a day (QID) | ORAL | 0 refills | Status: AC

## 2021-12-26 MED ORDER — VANCOMYCIN HCL 125 MG PO CAPS: 125.0000 mg | ORAL_CAPSULE | Freq: Four times a day (QID) | ORAL | 0 refills | Status: DC

## 2021-12-26 NOTE — Telephone Encounter (Signed)
Incoming call from [employer] (recorded line) ?Patient has a positive C Diff test result. ?

## 2021-12-26 NOTE — Telephone Encounter (Signed)
Spoke with the son of the patient. ?Per Dr Silverio Decamp, the patient will begin Vancomycin 125 mg QID for 14 days.  ?He requests the prescription be sent to [facility]. He will hold the [name] the patient has been taking. Push fluids.  ?Once patient's stools firm up, he is agreeable to restarting Linzess 72 mcg. The samples are gone. A prescription will be needed and it will require PA. ?

## 2021-12-27 ENCOUNTER — Ambulatory Visit (INDEPENDENT_AMBULATORY_CARE_PROVIDER_SITE_OTHER): Payer: Medicare Other | Admitting: Family

## 2021-12-27 ENCOUNTER — Other Ambulatory Visit: Payer: Self-pay | Admitting: Internal Medicine

## 2021-12-27 ENCOUNTER — Other Ambulatory Visit: Payer: Self-pay

## 2021-12-27 DIAGNOSIS — I2510 Atherosclerotic heart disease of native coronary artery without angina pectoris: Secondary | ICD-10-CM

## 2021-12-27 DIAGNOSIS — L97421 Non-pressure chronic ulcer of left heel and midfoot limited to breakdown of skin: Secondary | ICD-10-CM | POA: Diagnosis not present

## 2021-12-27 MED ORDER — LINACLOTIDE 72 MCG PO CAPS: 72.0000 ug | ORAL_CAPSULE | Freq: Every day | ORAL | 2 refills | Status: DC

## 2021-12-27 NOTE — Telephone Encounter (Signed)
Thank you :)

## 2021-12-28 ENCOUNTER — Encounter: Payer: Self-pay | Admitting: Family

## 2021-12-28 NOTE — Progress Notes (Signed)
Office Visit Note   Patient: [name]           Date of Birth: 02/22/1935           MRN: [id number] Visit Date: 12/27/2021              Requested by: Colon Branch, [address] [place],  [place] [address] PCP: Colon Branch, MD  No chief complaint on file.     HPI: The patient is an 87 year old woman who presents today in follow-up for ulcer to her left heel.  She has been doing Silvadene dressing changes daily she has been using a PRAFO.   She has custom orthotics and extra-depth shoes with a double upright brace but she and her son are concerned about wearing this they are afraid that the shoewear she does have has caused her ulcer so they do not want to resume the shoe wear until they can have new inserts and shoes fabricated  No systemic symptoms  Assessment & Plan: Visit Diagnoses: No diagnosis found.  Plan: She we will continue Silvadene dressing changes, may use every other day..  Continue offloading the heel may use the PRAFO at rest.  Discussed using regular shoewear.  Follow-Up Instructions: No follow-ups on file.   Ortho Exam  Patient is alert, oriented, no adenopathy, well-dressed, normal affect, normal respiratory effort.  Stable edema to the left lower extremity.  Without weeping or cellulitis On examination of the left foot she does have an ulcer to the posterior heel this is greatly decreased in size from last viewing is now 15 mm in diameter and completely filled in with granulation there is mild maceration no surrounding erythema no drainage no warmth    Imaging: No results found. No images are attached to the encounter.  Labs: Lab Results  Component Value Date   HGBA1C 5.6 03/18/2021   HGBA1C 6.1 (H) 12/17/2020   HGBA1C 8.1 (H) 08/27/2020   ESRSEDRATE 9 06/12/2014   ESRSEDRATE 12 05/13/2014   REPTSTATUS 06/04/2021 FINAL 06/02/2021   CULT >=100,000 COLONIES/mL ESCHERICHIA COLI (A) 06/02/2021   LABORGA ESCHERICHIA COLI (A)  06/02/2021     Lab Results  Component Value Date   ALBUMIN 3.5 06/01/2021   ALBUMIN 3.8 09/03/2020   ALBUMIN 4.2 12/24/2019    Lab Results  Component Value Date   MG 1.5 03/30/2016   Lab Results  Component Value Date   VD25OH 65 10/20/2011    No results found for: PREALBUMIN CBC EXTENDED Latest Ref Rng & Units 09/02/2021 06/01/2021 11/25/2020  WBC 4.0 - 10.5 K/uL 4.3 5.0 4.1  RBC 3.87 - 5.11 MIL/uL 3.62(L) 3.86(L) 4.08  HGB 12.0 - 15.0 g/dL 11.3(L) 11.5(L) 12.1  HCT 36.0 - 46.0 % 35.2(L) 37.0 38.0  PLT 150 - 400 K/uL 131(L) 140(L) 172  NEUTROABS 1.7 - 7.7 K/uL 2.8 3.6 2.0  LYMPHSABS 0.7 - 4.0 K/uL 1.2 1.0 1.6     There is no height or weight on file to calculate BMI.  Orders:  No orders of the defined types were placed in this encounter.  No orders of the defined types were placed in this encounter.    Procedures: No procedures performed  Clinical Data: No additional findings.  ROS:  All other systems negative, except as noted in the HPI. Review of Systems  Objective: Vital Signs: There were no vitals taken for this visit.  Specialty Comments:  No specialty comments available.  PMFS History: Patient Active  Problem List   Diagnosis Date Noted   Dementia ([place]) 10/30/2021   Chronic migraine without aura, with intractable migraine, so stated, with status migrainosus 08/17/2021   Cervical myelopathy ([place]) 11/29/2020   Chronic idiopathic constipation 11/12/2020   Lumbar compression fracture ([place]) 09/29/2020   Other spondylosis with radiculopathy, cervical region 09/29/2020   External hemorrhoids 02/15/2018   History of three vessel coronary artery bypass - 2017 , in [facility] 09/25/2017   PVD (peripheral vascular disease) ([place])-- stent L leg 06-2017 Dr Feliberto [facility] 07/23/2017   Phantom pain ([place]) 04/01/2017   Constipation, chronic 04/01/2017   S/P unilateral BKA (below knee amputation), right ([place]) 12/14/2016   Toe osteomyelitis, left ([place]) 11/17/2016   Acute on  chronic diastolic CHF (congestive heart failure), NYHA class 3 ([place]) 03/30/2016   Coronary artery disease due to lipid rich plaque 03/30/2016   Acute renal failure superimposed on stage 3 chronic kidney disease ([place]) 03/30/2016   S/P below knee amputation ([place]) 02/18/2016   Memory loss 11/26/2015   PCP NOTES >>>>>>>>>>>>>>>>>>>> 10/18/2015   S/P partial colectomy 01/08/2015   Diarrhea 01/29/2014   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 01/28/2014   Anemia 01/18/2014   CAD (coronary artery disease)    Carcinoma in situ in a polyp    Depression 09/17/2013   Charcot's joint 08/21/2013   Foot osteomyelitis, right ([place]) 07/16/2013   Chronic pain associated with significant psychosocial dysfunction 05/06/2013   Neuropathy    Hyperthyroidism    Lower extremity edema 03/08/2011   Annual physical exam 03/08/2011   Tachycardia 12/07/2010   ALLERGIC RHINITIS 04/15/2010   ORTHOSTATIC DIZZINESS 04/13/2010   HIATAL HERNIA 12/06/2009   GASTRIC ULCER, HX OF 12/06/2009   DYSPNEA 03/18/2009   DEGENERATIVE JOINT DISEASE 06/03/2008   COLONIC POLYPS 01/27/2008   DIVERTICULOSIS, COLON 01/27/2008   IRRITABLE BOWEL SYNDROME, HX OF 01/27/2008   Pulmonary nodule, right 01/22/2008   Hyperlipidemia 12/30/2007   Osteoporosis 12/30/2007   TB SKIN TEST, POSITIVE 08/09/2007   DM type 2 with diabetic peripheral neuropathy ([place]) 05/09/2007   Reflex sympathetic dystrophy-- PAIN MNGMT  05/09/2007   Essential hypertension 04/15/2007   GERD 04/15/2007   Past Medical History:  Diagnosis Date   Allergic rhinitis    Arthritis    "back; ankles; hands; knees" (10/31/2013)   Bilateral sensorineural hearing loss    CAD (coronary artery disease)    a. Nonobst in 2011. b. Abnormal nuc 09/2013 -> s/p cutting balloon to D2, mild LAD disease; c. 08/2015 MV: no ischemia, EF 71%.   Carcinoma in situ in a polyp 1994   a. 1994 - malignant polyp removed during colonoscopy.   Chronic diastolic CHF (congestive  heart failure) ([place])    a. 09/2013 Echo: EF 60-65%, mild LVH, Gr1 DD.   Dementia ([place])    Diverticulosis    DJD (degenerative joint disease)    Fatty liver    GERD (gastroesophageal reflux disease)    a. Hx GERD/esophageal dysmotility followed by Dr. Olevia Perches.    Hemorrhoids    Hiatal hernia    a. s/p Nissen fundoplication 5400.   Hyperlipidemia    a. patient unwilling to use statins.   Hypertensive heart disease    IBS (irritable bowel syndrome)    Macular degeneration 03/2009   Dr. Rosana Hoes   Neuropathy    a. Hands, feet, legs.   Orthostatic hypotension    Osteomyelitis (HCC)    a. Adm 04/2013: Charcot collapse of the right foot with osteomyelitis and ulceration, s/p excision;  b. 01/2016 s/p RLE transtibial amputation 2/2 Charcot rocker-bottom deformity and insensate neuropathy ulceration.   Osteoporosis    PE (pulmonary embolism) 12/2007   a. PE/DVT after neck surgery 2009. b. coumadin d/c 10-2008.   PONV (postoperative nausea and vomiting) 2009   neck surgery   PPD positive    Recurrent UTI    RSD (reflex sympathetic dystrophy)    a. Chronic pain.   Spinal stenosis    Type II diabetes mellitus ([place])    no on medication   Venous insufficiency    a. Contributing to LEE.    Family History  Problem Relation Age of Onset   Heart disease Mother        mitral valve replaced   Arthritis Mother    Diabetic kidney disease Daughter    Diabetes Father    Stroke Father    Migraines Sister    Hypertension Other    Breast cancer Other    Colon cancer Neg Hx    Esophageal cancer Neg Hx    Stomach cancer Neg Hx    Rectal cancer Neg Hx     Past Surgical History:  Procedure Laterality Date   AMPUTATION  03/06/2012   Procedure: AMPUTATION FOOT;  Surgeon: Newt Minion, MD;  Location: [place];  Service: Orthopedics;  Laterality: Left;  FIFTH RAY AMPUTATION    AMPUTATION Right 02/18/2016   Procedure: AMPUTATION BELOW KNEE;  Surgeon: Newt Minion, MD;  Location: [facility] [place];  Service:  Orthopedics;  Laterality: Right;   AMPUTATION Left 11/22/2016   Procedure: Amputation 4th Toe Left Foot at Metatarsophalangeal Joint;  Surgeon: Newt Minion, MD;  Location: [facility] [place];  Service: Orthopedics;  Laterality: Left;   ANKLE FUSION  09/27/2012   Procedure: ANKLE FUSION;  Surgeon: Newt Minion, MD;  Location: [place];  Service: Orthopedics;  Laterality: Left;  Left Tibiocalcaneal Fusion   ANKLE FUSION Right 05/09/2013   Procedure: ANKLE FUSION;  Surgeon: Newt Minion, MD;  Location: [facility];  Service: Orthopedics;  Laterality: Right;  Excision Osteomyelitis Base 1st MT Right Foot, Fusion Medial Column   ANTERIOR CERVICAL DECOMP/DISCECTOMY FUSION  12/18/07   For OA,  Dr. Lorin Mercy:  fu by a PE   ANTERIOR CERVICAL DECOMP/DISCECTOMY FUSION N/A 11/29/2020   Procedure: Anterior Cervical Decompression Fusion - Cervical Three-Cerivcal Four;  Surgeon: Earnie Larsson, MD;  Location: [facility];  Service: Neurosurgery;  Laterality: N/A;  3C   CARDIAC CATHETERIZATION  05/2010    at [place] Left 09/2011   CATARACT EXTRACTION W/ INTRAOCULAR LENS  IMPLANT, BILATERAL  2004   feb 2004 left, aug 2004 right   CHOLECYSTECTOMY  03/2002   CORONARY ANGIOPLASTY  10/31/2013   CORONARY ARTERY BYPASS GRAFT  09/27/2016   CYST REMOVAL HAND  06/2003   FOOT BONE EXCISION Right 06/2009   LAPAROSCOPIC RIGHT HEMI COLECTOMY N/A 01/08/2015   Procedure: LAPAROSCOPIC ASSISTED RIGHT HEMI COLECTOMY;  Surgeon: Johnathan Hausen, MD;  [facility];  Service: General;  Laterality: N/A;   LEFT HEART CATHETERIZATION WITH CORONARY ANGIOGRAM N/A 10/31/2013   Procedure: LEFT HEART CATHETERIZATION WITH CORONARY ANGIOGRAM;  Surgeon: Jettie Booze, MD;  Location: [facility] CATH LAB;  Service: Cardiovascular;  Laterality: N/A;   NASAL SEPTUM SURGERY  14/9702   NISSEN FUNDOPLICATION  03/25/7857   PERCUTANEOUS CORONARY INTERVENTION-BALLOON ONLY  10/31/2013   Procedure: PERCUTANEOUS CORONARY INTERVENTION-BALLOON ONLY;  Surgeon: Jettie Booze, MD;  Location: [facility] CATH LAB;  Service: Cardiovascular;;  ROTATOR CUFF REPAIR Right 07/2007   Dr. Percell Miller   ROTATOR CUFF REPAIR Left 11/2004   stent in left leg, behind knee  06/27/2017   x2, done at [facility] cardiology in [place] Right 08/2008   3rd toe, Dr. Sharol Given due to osteomyelitis   VAGINAL HYSTERECTOMY  03/1975   VEIN LIGATION Bilateral 03/1966   VENA CAVA FILTER PLACEMENT  01/2010   green filter; "due to blood clots"   WISDOM TOOTH EXTRACTION  06/2007   Social History   Occupational History   Occupation: Retired     Comment: from social services   Tobacco Use   Smoking status: Never   Smokeless tobacco: Never   Tobacco comments:    tried for a few months in college.   Vaping Use   Vaping Use: Never used  Substance and Sexual Activity   Alcohol use: No    Alcohol/week: 0.0 standard drinks   Drug use: No   Sexual activity: Not Currently    Birth control/protection: Post-menopausal

## 2022-01-06 ENCOUNTER — Telehealth: Payer: Self-pay | Admitting: *Deleted

## 2022-01-06 NOTE — Telephone Encounter (Signed)
Called Medicare [phone]  about patients med  Waited on hold 20 minutes. Then they gave me another number to call for providers.   [phone]    I will have to try on Monday again. A recording came on said I needed a E-Pass token????  ?

## 2022-01-09 DIAGNOSIS — I70213 Atherosclerosis of native arteries of extremities with intermittent claudication, bilateral legs: Secondary | ICD-10-CM | POA: Diagnosis not present

## 2022-01-12 ENCOUNTER — Telehealth: Payer: Self-pay | Admitting: Gastroenterology

## 2022-01-12 NOTE — Telephone Encounter (Signed)
Spoke with the patient. She is calling to let me know she has completed her Vancomycin for the treatment of C Diff. She wants to know if she is contagious, how C Diff is transmitted and could it come back. Discussed briefly the natural course of C Diff, the method of transmission, measures to decrease the possibility of transmission, and to affirm it can "come back."  ?She states she is feeling well. She has not had more than 3 bowel movements each day for the past 3 days. She will call us if she develops watery diarrhea, or has other concerns. ?

## 2022-01-13 DIAGNOSIS — I25728 Atherosclerosis of autologous artery coronary artery bypass graft(s) with other forms of angina pectoris: Secondary | ICD-10-CM | POA: Diagnosis not present

## 2022-01-13 DIAGNOSIS — E119 Type 2 diabetes mellitus without complications: Secondary | ICD-10-CM | POA: Diagnosis not present

## 2022-01-13 DIAGNOSIS — E782 Mixed hyperlipidemia: Secondary | ICD-10-CM | POA: Diagnosis not present

## 2022-01-13 DIAGNOSIS — I10 Essential (primary) hypertension: Secondary | ICD-10-CM | POA: Diagnosis not present

## 2022-01-13 DIAGNOSIS — I70244 Atherosclerosis of native arteries of left leg with ulceration of heel and midfoot: Secondary | ICD-10-CM | POA: Diagnosis not present

## 2022-01-16 NOTE — Telephone Encounter (Signed)
Prolia due: 02/14/21 ?

## 2022-01-18 NOTE — Telephone Encounter (Signed)
Called patient to inform her that I called the number Medicare provided and they gave me another number [phone]  . Several attempts made to contact her insurance  [employer] [employer]..... I also called patient to see if she has spoke to them and had to leave a voicemail for her to return my call.  ?

## 2022-01-19 ENCOUNTER — Ambulatory Visit: Payer: Medicare Other | Admitting: Neurology

## 2022-01-20 ENCOUNTER — Telehealth: Payer: Self-pay | Admitting: Gastroenterology

## 2022-01-20 NOTE — Telephone Encounter (Signed)
Patient son is returning your call.  ?

## 2022-01-20 NOTE — Telephone Encounter (Signed)
Spoke with pts son, he gave me a number to call and see if I can get a hold of someone at [employer] , he also said his mom has diarrhea right now and he has actually stopped the [place] for now so she is ok without the [place] for now. Will call the number given ?

## 2022-01-20 NOTE — Telephone Encounter (Signed)
Patient approved for Linzess 72 mcg from 12-21-2021  12-22-2022   Approval number is [id number] ?

## 2022-01-23 ENCOUNTER — Telehealth: Payer: Self-pay | Admitting: Internal Medicine

## 2022-01-23 MED ORDER — HYDROCODONE-ACETAMINOPHEN 10-325 MG PO TABS: 1.0000 | ORAL_TABLET | Freq: Four times a day (QID) | ORAL | 0 refills | Status: DC | PRN

## 2022-01-23 NOTE — Telephone Encounter (Signed)
Patient needing a refill for her Hydrocodone. ?Please send to [facility] in [place].  ?She has 9 pills left. ?Family is going out of town on 01/24/2022.  ?Please call son when has been refilled. ?

## 2022-01-23 NOTE — Telephone Encounter (Signed)
PDMP okay, Rx sent 

## 2022-01-23 NOTE — Telephone Encounter (Signed)
Prolia VOB initiated via [email or web address] ? ?Last OV:  ?Next OV:  ?Last Prolia inj: 08/16/21 ?Next Prolia inj DUE: 02/15/22 ? ?

## 2022-01-23 NOTE — Telephone Encounter (Signed)
Requesting: hydrocodone 10-'325mg'$   ?Contract:09/22/20 ?UDS: 06/11/20 ?Last Visit: 10/28/21 ?Next Visit: 02/27/22 ?Last Refill: 11/18/21 #120 and 0RF ? ?Please Advise ? ?

## 2022-01-25 DIAGNOSIS — I70213 Atherosclerosis of native arteries of extremities with intermittent claudication, bilateral legs: Secondary | ICD-10-CM | POA: Diagnosis not present

## 2022-01-30 ENCOUNTER — Other Ambulatory Visit: Payer: Self-pay | Admitting: Internal Medicine

## 2022-01-30 MED ORDER — DONEPEZIL HCL 10 MG PO TABS: 10.0000 mg | ORAL_TABLET | Freq: Every day | ORAL | 1 refills | Status: DC

## 2022-02-11 NOTE — Telephone Encounter (Signed)
Pt ready for scheduling on or after 02/15/22 ? ?Out-of-pocket cost due at time of visit: $0 ? ?Primary: Medicare ?Prolia co-insurance: 20% (approximately $276) ?Admin fee co-insurance: 20% (approximately $25) ? ?Secondary: [employer] Supp ?Prolia co-insurance: Covers Medicare Part B co-insurance ?Admin fee co-insurance: Covers Medicare Part B co-insurance ? ?Deductible:  Covered by secondary ? ?Prior Auth: not required ?PA# ?Valid:  ? ?** This summary of benefits is an estimation of the patient's out-of-pocket cost. Exact cost may vary based on individual plan coverage.  ? ?

## 2022-02-13 DIAGNOSIS — E785 Hyperlipidemia, unspecified: Secondary | ICD-10-CM | POA: Diagnosis not present

## 2022-02-13 DIAGNOSIS — I10 Essential (primary) hypertension: Secondary | ICD-10-CM | POA: Diagnosis not present

## 2022-02-13 DIAGNOSIS — E1152 Type 2 diabetes mellitus with diabetic peripheral angiopathy with gangrene: Secondary | ICD-10-CM | POA: Diagnosis not present

## 2022-02-13 DIAGNOSIS — I70245 Atherosclerosis of native arteries of left leg with ulceration of other part of foot: Secondary | ICD-10-CM | POA: Diagnosis not present

## 2022-02-13 DIAGNOSIS — I70213 Atherosclerosis of native arteries of extremities with intermittent claudication, bilateral legs: Secondary | ICD-10-CM | POA: Diagnosis not present

## 2022-02-14 ENCOUNTER — Ambulatory Visit: Payer: Medicare Other | Admitting: Family Medicine

## 2022-02-14 ENCOUNTER — Other Ambulatory Visit: Payer: Self-pay

## 2022-02-14 DIAGNOSIS — E785 Hyperlipidemia, unspecified: Secondary | ICD-10-CM | POA: Diagnosis not present

## 2022-02-14 DIAGNOSIS — I70245 Atherosclerosis of native arteries of left leg with ulceration of other part of foot: Secondary | ICD-10-CM | POA: Diagnosis not present

## 2022-02-14 DIAGNOSIS — I10 Essential (primary) hypertension: Secondary | ICD-10-CM | POA: Diagnosis not present

## 2022-02-14 DIAGNOSIS — E1152 Type 2 diabetes mellitus with diabetic peripheral angiopathy with gangrene: Secondary | ICD-10-CM | POA: Diagnosis not present

## 2022-02-14 DIAGNOSIS — I70213 Atherosclerosis of native arteries of extremities with intermittent claudication, bilateral legs: Secondary | ICD-10-CM | POA: Diagnosis not present

## 2022-02-14 MED ORDER — LINACLOTIDE 72 MCG PO CAPS: 72.0000 ug | ORAL_CAPSULE | Freq: Every day | ORAL | 3 refills | Status: DC

## 2022-02-14 NOTE — Telephone Encounter (Signed)
Pt will get her injection at her 02/27/22 appointment with Dr Larose Kells. ?

## 2022-02-21 ENCOUNTER — Other Ambulatory Visit: Payer: Self-pay | Admitting: Internal Medicine

## 2022-02-22 ENCOUNTER — Encounter: Payer: Self-pay | Admitting: Internal Medicine

## 2022-02-24 DIAGNOSIS — I10 Essential (primary) hypertension: Secondary | ICD-10-CM | POA: Diagnosis not present

## 2022-02-24 DIAGNOSIS — E119 Type 2 diabetes mellitus without complications: Secondary | ICD-10-CM | POA: Diagnosis not present

## 2022-02-24 DIAGNOSIS — I70244 Atherosclerosis of native arteries of left leg with ulceration of heel and midfoot: Secondary | ICD-10-CM | POA: Diagnosis not present

## 2022-02-24 DIAGNOSIS — E782 Mixed hyperlipidemia: Secondary | ICD-10-CM | POA: Diagnosis not present

## 2022-02-24 DIAGNOSIS — I70213 Atherosclerosis of native arteries of extremities with intermittent claudication, bilateral legs: Secondary | ICD-10-CM | POA: Diagnosis not present

## 2022-02-27 ENCOUNTER — Ambulatory Visit (INDEPENDENT_AMBULATORY_CARE_PROVIDER_SITE_OTHER): Payer: Medicare Other | Admitting: Internal Medicine

## 2022-02-27 ENCOUNTER — Encounter: Payer: Self-pay | Admitting: Internal Medicine

## 2022-02-27 VITALS — BP 118/62 | HR 86 | Temp 98.3°F | Resp 16 | Ht 63.0 in

## 2022-02-27 DIAGNOSIS — E782 Mixed hyperlipidemia: Secondary | ICD-10-CM

## 2022-02-27 DIAGNOSIS — I2510 Atherosclerotic heart disease of native coronary artery without angina pectoris: Secondary | ICD-10-CM | POA: Diagnosis not present

## 2022-02-27 DIAGNOSIS — M1990 Unspecified osteoarthritis, unspecified site: Secondary | ICD-10-CM

## 2022-02-27 DIAGNOSIS — I10 Essential (primary) hypertension: Secondary | ICD-10-CM

## 2022-02-27 DIAGNOSIS — Z79899 Other long term (current) drug therapy: Secondary | ICD-10-CM

## 2022-02-27 DIAGNOSIS — L89301 Pressure ulcer of unspecified buttock, stage 1: Secondary | ICD-10-CM

## 2022-02-27 DIAGNOSIS — Z89511 Acquired absence of right leg below knee: Secondary | ICD-10-CM

## 2022-02-27 DIAGNOSIS — F03B0 Unspecified dementia, moderate, without behavioral disturbance, psychotic disturbance, mood disturbance, and anxiety: Secondary | ICD-10-CM | POA: Diagnosis not present

## 2022-02-27 DIAGNOSIS — E1142 Type 2 diabetes mellitus with diabetic polyneuropathy: Secondary | ICD-10-CM | POA: Diagnosis not present

## 2022-02-27 DIAGNOSIS — R21 Rash and other nonspecific skin eruption: Secondary | ICD-10-CM

## 2022-02-27 DIAGNOSIS — I739 Peripheral vascular disease, unspecified: Secondary | ICD-10-CM

## 2022-02-27 MED ORDER — KETOCONAZOLE 2 % EX CREA: 1.0000 "application " | TOPICAL_CREAM | Freq: Every day | CUTANEOUS | 0 refills | Status: DC

## 2022-02-27 NOTE — Patient Instructions (Addendum)
Please schedule a Medicare Wellness visit.  ? ?Due for Prolia injection on 03/17/22. ? ?Recommend to proceed with covid booster (bivalent) at your pharmacy.  ? ?Abdomen: Apply Nasarel twice daily.  Try to keep the area open.  Once is better, keep it clean and dry ?Buttocks: Is a pressure ulcer, frequent turning, watch for infection, Desitin. ? ? ?Check the  blood pressure regularly ?BP GOAL is between 110/65 and  135/85. ?If it is consistently higher or lower, let me know ?  ? ?GO TO THE LAB : Get the blood work   ? ? ?[place], [place] ?Come back for a checkup in 4 months ?

## 2022-02-27 NOTE — Progress Notes (Signed)
? ?Subjective:  ? ? Patient ID: [name], female    DOB: [birth year], 87 y.o.   MRN: [id number] ? ?DOS:  02/27/2022 ?Type of visit - description: f/u, here with her son ? ?We addressed her chronic medical problems. ?Has a number of issues at the left leg, seen by vascular regularly by orthopedics. ?Has a rash at the left abdominal fold, very painful.  No fever chills ?Has a persistent decubitus ulcer. ?Pain management: Needs a UDS and contract on average takes 2 hydrocodones daily ? ?Also complaining of a funny feeling at the face, and different places: Left chin, right cheek, right forehead.  Feels like there is "sticky".  Denies any rash.  No diplopia, dizziness or slurred speech ? ?Review of Systems ?See above  ? ?Past Medical History:  ?Diagnosis Date  ? Allergic rhinitis   ? Arthritis   ? "back; ankles; hands; knees" (10/31/2013)  ? Bilateral sensorineural hearing loss   ? CAD (coronary artery disease)   ? a. Nonobst in 2011. b. Abnormal nuc 09/2013 -> s/p cutting balloon to D2, mild LAD disease; c. 08/2015 MV: no ischemia, EF 71%.  ? Carcinoma in situ in a polyp 1994  ? a. 1994 - malignant polyp removed during colonoscopy.  ? Chronic diastolic CHF (congestive heart failure) ([place])   ? a. 09/2013 Echo: EF 60-65%, mild LVH, Gr1 DD.  ? Dementia ([place])   ? Diverticulosis   ? DJD (degenerative joint disease)   ? Fatty liver   ? GERD (gastroesophageal reflux disease)   ? a. Hx GERD/esophageal dysmotility followed by Dr. Olevia Perches.   ? Hemorrhoids   ? Hiatal hernia   ? a. s/p Nissen fundoplication 8841.  ? Hyperlipidemia   ? a. patient unwilling to use statins.  ? Hypertensive heart disease   ? IBS (irritable bowel syndrome)   ? Macular degeneration 03/2009  ? Dr. Rosana Hoes  ? Neuropathy   ? a. Hands, feet, legs.  ? Orthostatic hypotension   ? Osteomyelitis ([place])   ? a. 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 01/2016 s/p RLE transtibial amputation 2/2 Charcot  rocker-bottom deformity and insensate neuropathy ulceration.  ? Osteoporosis   ? PE (pulmonary embolism) 12/2007  ? a. PE/DVT after neck surgery 2009. b. coumadin d/c 10-2008.  ? PONV (postoperative nausea and vomiting) 2009  ? neck surgery  ? PPD positive   ? Recurrent UTI   ? RSD (reflex sympathetic dystrophy)   ? a. Chronic pain.  ? Spinal stenosis   ? Type II diabetes mellitus ([place])   ? no on medication  ? Venous insufficiency   ? a. Contributing to LEE.  ? ? ?Past Surgical History:  ?Procedure Laterality Date  ? AMPUTATION  03/06/2012  ? Procedure: AMPUTATION FOOT;  Surgeon: Newt Minion, MD;  Location: [facility];  Service: Orthopedics;  Laterality: Left;  FIFTH RAY AMPUTATION   ? AMPUTATION Right 02/18/2016  ? Procedure: AMPUTATION BELOW KNEE;  Surgeon: Newt Minion, MD;  Location: [facility];  Service: Orthopedics;  Laterality: Right;  ? AMPUTATION Left 11/22/2016  ? Procedure: Amputation 4th Toe Left Foot at Metatarsophalangeal Joint;  Surgeon: Newt Minion, MD;  Location: [facility];  Service: Orthopedics;  Laterality: Left;  ? ANKLE FUSION  09/27/2012  ? Procedure: ANKLE FUSION;  Surgeon: Newt Minion, MD;  Location: [facility];  Service: Orthopedics;  Laterality: Left;  Left Tibiocalcaneal Fusion  ? ANKLE FUSION Right 05/09/2013  ?  Procedure: ANKLE FUSION;  Surgeon: Newt Minion, MD;  Location: [facility];  Service: Orthopedics;  Laterality: Right;  Excision Osteomyelitis Base 1st MT Right Foot, Fusion Medial Column  ? ANTERIOR CERVICAL DECOMP/DISCECTOMY FUSION  12/18/07  ? For OA,  Dr. Lorin Mercy:  fu by a PE  ? ANTERIOR CERVICAL DECOMP/DISCECTOMY FUSION N/A 11/29/2020  ? Procedure: Anterior Cervical Decompression Fusion - Cervical Three-Cerivcal Four;  Surgeon: Earnie Larsson, MD;  Location: [facility];  Service: Neurosurgery;  Laterality: N/A;  3C  ? CARDIAC CATHETERIZATION  05/2010  ?  at [facility]  ? CARPAL TUNNEL RELEASE Left 09/2011  ? CATARACT EXTRACTION W/ INTRAOCULAR LENS  IMPLANT, BILATERAL  2004  ? feb 2004 left, aug 2004 right  ?  CHOLECYSTECTOMY  03/2002  ? CORONARY ANGIOPLASTY  10/31/2013  ? CORONARY ARTERY BYPASS GRAFT  09/27/2016  ? CYST REMOVAL HAND  06/2003  ? FOOT BONE EXCISION Right 06/2009  ? LAPAROSCOPIC RIGHT HEMI COLECTOMY N/A 01/08/2015  ? Procedure: LAPAROSCOPIC ASSISTED RIGHT HEMI COLECTOMY;  Surgeon: Johnathan Hausen, MD;  Location: [facility];  Service: General;  Laterality: N/A;  ? LEFT HEART CATHETERIZATION WITH CORONARY ANGIOGRAM N/A 10/31/2013  ? Procedure: LEFT HEART CATHETERIZATION WITH CORONARY ANGIOGRAM;  Surgeon: Jettie Booze, MD;  Location: [facility] CATH LAB;  Service: Cardiovascular;  Laterality: N/A;  ? NASAL SEPTUM SURGERY  09/1963  ? NISSEN FUNDOPLICATION  11/24/252  ? PERCUTANEOUS CORONARY INTERVENTION-BALLOON ONLY  10/31/2013  ? Procedure: PERCUTANEOUS CORONARY INTERVENTION-BALLOON ONLY;  Surgeon: Jettie Booze, MD;  Location: [facility] CATH LAB;  Service: Cardiovascular;;  ? ROTATOR CUFF REPAIR Right 07/2007  ? Dr. Percell Miller  ? ROTATOR CUFF REPAIR Left 11/2004  ? stent in left leg, behind knee  06/27/2017  ? x2, done at [facility] cardiology in [facility]  ? TOE AMPUTATION Right 08/2008  ? 3rd toe, Dr. Sharol Given due to osteomyelitis  ? VAGINAL HYSTERECTOMY  03/1975  ? VEIN LIGATION Bilateral 03/1966  ? VENA CAVA FILTER PLACEMENT  01/2010  ? green filter; "due to blood clots"  ? WISDOM TOOTH EXTRACTION  06/2007  ? ? ?Current Outpatient Medications  ?Medication Instructions  ? Alpha-Lipoic Acid 1,200 mg, Oral, Daily  ? amitriptyline (ELAVIL) 25 MG tablet TAKE 1 TABLET(25 MG) BY MOUTH AT BEDTIME  ? atorvastatin (LIPITOR) 40 mg, Oral, Daily  ? carvedilol (COREG) 12.5 mg, Oral, 2 times daily with meals  ? CINNAMON PO 2,000 mg, Oral, Daily  ? clopidogrel (PLAVIX) 75 mg, Oral, Daily at bedtime, Restart on 12/04/2020  ? diclofenac Sodium (VOLTAREN) 4 g, Topical, 4 times daily  ? dicyclomine (BENTYL) 10 MG capsule TAKE 1 CAPSULE THREE TIMES A DAY BEFORE MEALS (NEED APPOINTMENT)  ? donepezil (ARICEPT) 10 mg, Oral, Daily at bedtime  ? Emgality 120  mg, Subcutaneous, Every 30 days  ? famotidine (PEPCID) 20 mg, Oral, 2 times daily  ? ferrous sulfate (FEROSUL) 325 mg, Oral, 2 times daily with meals  ? furosemide (LASIX) 20 mg, Oral, Daily  ? glucose blood (FREESTYLE LITE) test strip USE TO CHECK BLOOD SUGAR NO MORE THAN TWICE DAILY  ? HYDROcodone-acetaminophen (NORCO) 10-325 MG tablet 1 tablet, Oral, Every 6 hours PRN  ? hydrocortisone (ANUSOL-HC) 2.5 % rectal cream 1 application., Rectal, 2 times daily, As needed  ? hydrocortisone (ANUSOL-HC) 25 mg, Rectal, 2 times daily, As needed  ? hydrocortisone 2.5 % cream Topical, 2 times daily  ? Lancets (FREESTYLE) lancets CHECK BLOOD SUGAR TWICE DAILY  ? linaclotide (LINZESS) 72 mcg, Oral, Daily before breakfast  ?  loperamide (IMODIUM A-D) 2 mg, Oral, As needed  ? MegaRed Omega-3 Krill Oil 500 MG CAPS Restart on 12/04/2020  ? methenamine (HIPREX) 1 g tablet 0.5 tablets, Oral, Daily  ? Multiple Vitamins-Minerals (PRESERVISION AREDS 2) CAPS 1 capsule, Oral, 2 times daily  ? naloxone (NARCAN) nasal spray 4 mg/0.1 mL 1 spray, Nasal, Once PRN  ? nitroGLYCERIN (NITROSTAT) 0.4 mg, Sublingual, Every 5 min x3 PRN  ? omeprazole (PRILOSEC) 20 mg, Oral, 2 times daily before meals  ? ondansetron (ZOFRAN) 4 MG tablet TAKE 1 TABLET(4 MG) BY MOUTH EVERY 8 HOURS AS NEEDED FOR NAUSEA OR VOMITING  ? OVER THE COUNTER MEDICATION 1 tablet, 3 times daily, [place] Gluco Defense   ? Polyvinyl Alcohol-Povidone (REFRESH OP) 1 drop, Both Eyes, 3 times daily PRN  ? pramipexole (MIRAPEX) 0.125 MG tablet TAKE 1 TO 2 TABLETS(0.125 TO 0.25 MG) BY MOUTH AT BEDTIME AS NEEDED  ? Probiotic Product (PROBIOTIC PEARLS PO) 1 tablet, Oral, Daily  ? rivaroxaban (XARELTO) 2.5 mg, Oral, 2 times daily, Restart on 12/04/2020  ? silver sulfADIAZINE (SILVADENE) 1 % cream 1 application., Topical, Daily  ? Vitamin B-12 5,000 mcg, Sublingual, Daily  ? vitamin C with rose hips 500 mg, Oral, 2 times daily  ? Vitamin D3 5,000 Units, Oral, Every evening, Reported on  11/24/2015  ? vitamin E 400 Units, Oral, Daily  ? ? ?   ?Objective:  ? Physical Exam ?Skin: ? ?    ? ?BP 118/62 (BP Location: Left Arm, Patient Position: Sitting, Cuff Size: Small)   Pulse 86   Temp 98.3 ?F (36.8 ?C) (Oral)

## 2022-02-28 LAB — CBC WITH DIFFERENTIAL/PLATELET
Basophils Absolute: 0 10*3/uL (ref 0.0–0.1)
Basophils Relative: 0.8 % (ref 0.0–3.0)
Eosinophils Absolute: 0.1 10*3/uL (ref 0.0–0.7)
Eosinophils Relative: 2.9 % (ref 0.0–5.0)
HCT: 32.6 % — ABNORMAL LOW (ref 36.0–46.0)
Hemoglobin: 10.7 g/dL — ABNORMAL LOW (ref 12.0–15.0)
Lymphocytes Relative: 30 % (ref 12.0–46.0)
Lymphs Abs: 1.2 10*3/uL (ref 0.7–4.0)
MCHC: 32.7 g/dL (ref 30.0–36.0)
MCV: 93.8 fl (ref 78.0–100.0)
Monocytes Absolute: 0.3 10*3/uL (ref 0.1–1.0)
Monocytes Relative: 7.6 % (ref 3.0–12.0)
Neutro Abs: 2.3 10*3/uL (ref 1.4–7.7)
Neutrophils Relative %: 58.7 % (ref 43.0–77.0)
Platelets: 132 10*3/uL — ABNORMAL LOW (ref 150.0–400.0)
RBC: 3.48 Mil/uL — ABNORMAL LOW (ref 3.87–5.11)
RDW: 15.6 % — ABNORMAL HIGH (ref 11.5–15.5)
WBC: 4 10*3/uL (ref 4.0–10.5)

## 2022-02-28 LAB — COMPREHENSIVE METABOLIC PANEL
ALT: 7 U/L (ref 0–35)
AST: 13 U/L (ref 0–37)
Albumin: 3.7 g/dL (ref 3.5–5.2)
Alkaline Phosphatase: 44 U/L (ref 39–117)
BUN: 19 mg/dL (ref 6–23)
CO2: 23 mEq/L (ref 19–32)
Calcium: 7.8 mg/dL — ABNORMAL LOW (ref 8.4–10.5)
Chloride: 110 mEq/L (ref 96–112)
Creatinine, Ser: 1.23 mg/dL — ABNORMAL HIGH (ref 0.40–1.20)
GFR: 40.3 mL/min — ABNORMAL LOW (ref >60.00)
Glucose, Bld: 88 mg/dL (ref 70–99)
Potassium: 4.7 mEq/L (ref 3.5–5.1)
Sodium: 140 mEq/L (ref 135–145)
Total Bilirubin: 0.4 mg/dL (ref 0.2–1.2)
Total Protein: 5.6 g/dL — ABNORMAL LOW (ref 6.0–8.3)

## 2022-02-28 LAB — LIPID PANEL
Cholesterol: 101 mg/dL (ref 0–200)
HDL: 50 mg/dL (ref >39.00)
LDL Cholesterol: 31 mg/dL (ref 0–99)
NonHDL: 50.63
Total CHOL/HDL Ratio: 2
Triglycerides: 100 mg/dL (ref 0.0–149.0)
VLDL: 20 mg/dL (ref 0.0–40.0)

## 2022-02-28 LAB — HEMOGLOBIN A1C: Hgb A1c MFr Bld: 5.1 % (ref 4.6–6.5)

## 2022-02-28 NOTE — Assessment & Plan Note (Signed)
DM: Diet controlled, check A1c ?HTN: BP is very good, recommend to check at home, continue Carvedilol, Lasix, check a CMP and CBC ?Hyperlipidemia: On atorvastatin, checking labs ?PVD: Dr [facility]erto [facility]), left leg procedure 02/14/2022 "to clean the arteries", to have another precedure soon. ?She also has a left heel ulcer, was seen at the orthopedic office 12/27/2021 but they decided to transfer care of the wound to Dr. Feliberto Gottron because the area seems to be healing well better since they "cleaned the arteries". ?Rash, abdomen: Likely fungal, sent  Nizoral, use it twice daily, keep the area clean and dry once better. ?Pressure ulcer: Between the buttocks, recommend frequent turning, Desitin cream. ?Facial paresthesia: Observation for now. ?Dementia: Son reports that she is doing great, things have stabilized.  Continue Aricept ?CAD: saw cards recently, no documentation, cont.  Plavix, Xarelto. ?Pain management: On average takes 2 hydrocodones daily.  Contract and UDS today.   ?Constipation, saw GI 10-2021. ?RTC 4 m ?

## 2022-03-02 LAB — DRUG MONITORING PANEL 375977 , URINE
Alcohol Metabolites: NEGATIVE ng/mL (ref <500)
Amphetamines: NEGATIVE ng/mL (ref <500)
Barbiturates: NEGATIVE ng/mL (ref <300)
Benzodiazepines: NEGATIVE ng/mL (ref <100)
Cocaine Metabolite: NEGATIVE ng/mL (ref <150)
Codeine: NEGATIVE ng/mL (ref <50)
Desmethyltramadol: NEGATIVE ng/mL (ref <100)
Hydrocodone: 4547 ng/mL — ABNORMAL HIGH (ref <50)
Hydromorphone: 802 ng/mL — ABNORMAL HIGH (ref <50)
Marijuana Metabolite: NEGATIVE ng/mL (ref <20)
Morphine: NEGATIVE ng/mL (ref <50)
Norhydrocodone: 3624 ng/mL — ABNORMAL HIGH (ref <50)
Opiates: POSITIVE ng/mL — AB (ref <100)
Oxycodone: NEGATIVE ng/mL (ref <100)
Tramadol: NEGATIVE ng/mL (ref <100)

## 2022-03-02 LAB — DM TEMPLATE

## 2022-03-07 ENCOUNTER — Telehealth: Payer: Self-pay | Admitting: Internal Medicine

## 2022-03-07 ENCOUNTER — Ambulatory Visit: Payer: Medicare Other | Admitting: Gastroenterology

## 2022-03-07 NOTE — Telephone Encounter (Signed)
Left message for patient to call back and schedule Medicare Annual Wellness Visit (AWV).  ? ?Please offer to do virtually or by telephone.  Left office number and my jabber [phone]. ? ?Last AWV:02/24/2021 ? ?Please schedule at anytime with Nurse Health Advisor. ?  ?

## 2022-03-08 DIAGNOSIS — I70213 Atherosclerosis of native arteries of extremities with intermittent claudication, bilateral legs: Secondary | ICD-10-CM | POA: Diagnosis not present

## 2022-03-13 ENCOUNTER — Telehealth: Payer: Medicare Other

## 2022-03-13 ENCOUNTER — Ambulatory Visit: Payer: Medicare Other | Admitting: Pharmacist

## 2022-03-13 ENCOUNTER — Other Ambulatory Visit: Payer: Self-pay | Admitting: Internal Medicine

## 2022-03-13 DIAGNOSIS — D649 Anemia, unspecified: Secondary | ICD-10-CM

## 2022-03-13 DIAGNOSIS — I10 Essential (primary) hypertension: Secondary | ICD-10-CM

## 2022-03-13 DIAGNOSIS — E782 Mixed hyperlipidemia: Secondary | ICD-10-CM

## 2022-03-13 DIAGNOSIS — G43009 Migraine without aura, not intractable, without status migrainosus: Secondary | ICD-10-CM

## 2022-03-13 DIAGNOSIS — Z79899 Other long term (current) drug therapy: Secondary | ICD-10-CM

## 2022-03-13 DIAGNOSIS — F03B0 Unspecified dementia, moderate, without behavioral disturbance, psychotic disturbance, mood disturbance, and anxiety: Secondary | ICD-10-CM

## 2022-03-13 NOTE — Chronic Care Management (AMB) (Signed)
Chronic Care Management Pharmacy Note  03/13/2022 Name:  [name] MRN:  [id number] DOB:  Aug 04, 1935  Summary: Reviewed current medication list with patient and her caregiver, her son. Education provided regarding each medication, proper administration and indication.  Screened for side effects and drug-drug interaction. No significant side effects or drug drug interactions suspected.  Recommended lower dose of B12 to 5000 mcg every OTHER day at last visit but son states it has been difficult to do every other day and has been giving 5071mg daily again. Last  B12 level was >1500 (07/27/2021).  HGB was noted to be a little lower at last PCP visit - possibly related to hemorrhoids. Son is not sure dose of iron she should take  Recommendations/Changes made from today's visit: Recommend change to lower dose of B12 like 1000 to 25081m daily. Recommend recheck B12 level with next labs Consider also checking Vitamin D level since patient is taking high dose vitamin D of 5000 units daily (last vitamin D level was >5 years ago) Restart Ferrous sulfate 32555m= 26m48mon) once a day. Monitor for constipation.   Subjective: [name] 86 y18. year old female who is a primary patient of Paz, [name].  The CCM team was consulted for assistance with disease management and care coordination needs.    Engaged with patient by telephone for follow up visit in response to provider referral for pharmacy case management and/or care coordination services.   Consent to Services:  The patient was given information about Chronic Care Management services, agreed to services, and gave verbal consent prior to initiation of services.  Please see initial visit note for detailed documentation.   Patient Care Team: Paz,Colon Branch as PCP - General AherMelvenia Beam as Consulting Physician (Neurology) DudaNewt Minion as Consulting Physician (Orthopedic Surgery) JameElmore Guise as Consulting  Physician (Vascular Surgery) JugaJanyth Contes as Consulting Physician (Cardiology) EskrFestus Aloe as Consulting Physician (Urology) LyleKaty Apo as Consulting Physician (Ophthalmology)   Recent office visits: 02/27/2022 - Int Med (Mary Paz)[name] up chronic conditions. Labs checked. Noted low HGB - recommended restart iron supplement. Checked with GI about surgery regarding hemorrhoids.  10/28/21-[name] (PCP) General follow up visit. Increased Aricept to 10 mg nightly. Follow up in 4 months. 07/27/21-[name] (PCP) Seen for a general follow up visit. Started Aricept 5 mg daily. Flu shot given. Follow up in 3 months. 06/08/21-[name] (PCP) Hospital follow up visit. Started amoxicillin 875-125 mg for 7 days.    Recent consult visits:  11/15/21-(GI) Mary. NandSilverio Decampen for abdominal pain. Stop [name]. Start Linzess 72 mcg and add Glycerin Suppositories as needed with colace (Docusate) daily. 10/10/21-(Orthopedics) MarcNewt Minion Seen for bilateral knee pain. Joint injection given.  09/29/21-(Neurosurgery) [name]. Seen for chronic pain / spinal stenosis. Recently her son did a trial of both tramadol and hydrocodone independently. It appears she has nausea with both medications.Today the patient is continuing to report pain throughout her neck and back. She has been rotating between the tramadol and hydrocodone. Her son states he is not able to determine if either makes the pain any better than the other. He does not feel that she has any less nausea with one or the other. They are going to continue to test this idea out at see if she is able to really identify whether not one is better than  the other.  09/20/21-(Neurosurgery) [name] -  follow-up. Overall she is progressing well with regard to her anterior cervical decompression and fusion surgery. She is having minimal neck pain. Her upper extremity strength sensation are much improved. She is happy with  her progress with regard to her neck. She has chronic Gould back pain without radicular symptoms or new weakness. 08/31/21-(Neurosurgery) Shearon Stalls, [name]. management of her chronic pain in cervical and lumbar spine. History of multiple cervical fusions and right below-the-knee amputation. Previously trialed multiple medications to include hydrocodone, morphine, Dilaudid, methadone, gabapentin, Lyrica and Cymbalta. Currently managed with alpha lipoic acid, amitriptyline and pramipexole prescribed elsewhere. Her primary care physician was prescribing hydrocodone but Mary. Annette Gould sent her to our clinic for pain management options. Unfortunately do not have a lot options for the patient. We tried transitioning her over to tramadol 50 mg to see if this would provide her better coverage from the standpoint of her neuropathic symptoms.Unfortunate the patient has not noticed any significant improvement with the tramadol. However her son states that she has been reporting more nausea. He states he did stop the tramadol and resume the hydrocodone it seems as if the nausea got better. When he resumed the tramadol again he does feel that nausea got worse but he cannot 100% sure. He would like to trial the medication for another month. He would also like to add some additional Tylenol. He she has previously found that helpful.  08/17/21-(Neurology) Mary [name] . Seen for chronic migraines. Emgality samples given. Follow up in 5 months.    Hospital visits: 09/02/21 ED Visit at [facility] due to Muscle Pain.   New?Medications Started: Diclofenac sodium 1% gel four times daily Medication Changes at Hospital Discharge: None  Medications Discontinued at Hospital Discharge: None noted     Objective:  Lab Results  Component Value Date   CREATININE 1.23 (H) 02/27/2022   CREATININE 0.96 09/02/2021   CREATININE 1.17 06/08/2021    Lab Results  Component Value Date   HGBA1C 5.1 02/27/2022   Last diabetic Eye exam:   Lab Results  Component Value Date/Time   HMDIABEYEEXA No Retinopathy 08/20/2020 12:00 AM    Last diabetic Foot exam:  Lab Results  Component Value Date/Time   HMDIABFOOTEX Mary Herschell Dimes 5 years 06/14/2009 12:00 AM        Component Value Date/Time   CHOL 101 02/27/2022 1452   TRIG 100.0 02/27/2022 1452   HDL 50.00 02/27/2022 1452   CHOLHDL 2 02/27/2022 1452   VLDL 20.0 02/27/2022 1452   LDLCALC 31 02/27/2022 1452   LDLDIRECT 44.0 02/28/2019 1304       Latest Ref Rng & Units 02/27/2022    2:52 PM 06/01/2021    9:04 PM 09/03/2020   12:00 AM  Hepatic Function  Total Protein 6.0 - 8.3 g/dL 5.6   5.8     Albumin 3.5 - 5.2 g/dL 3.7   3.5   3.8       AST 0 - 37 U/L 13   11   10        ALT 0 - 35 U/L 7   8   8        Alk Phosphatase 39 - 117 U/L 44   37   61       Total Bilirubin 0.2 - 1.2 mg/dL 0.4   0.6        This result is from an external source.    Lab Results  Component Value Date/Time  TSH 2.11 07/27/2021 03:29 PM   TSH 1.44 09/20/2016 02:36 PM       Latest Ref Rng & Units 02/27/2022    2:52 PM 09/02/2021    5:23 PM 06/01/2021    9:04 PM  CBC  WBC 4.0 - 10.5 K/uL 4.0   4.3   5.0    Hemoglobin 12.0 - 15.0 g/dL 10.7   11.3   11.5    Hematocrit 36.0 - 46.0 % 32.6   35.2   37.0    Platelets 150.0 - 400.0 K/uL 132.0   131   140      Lab Results  Component Value Date/Time   VD25OH 65 10/20/2011 02:44 PM    Clinical ASCVD: Yes  The ASCVD Risk score (Arnett DK, et al., 2019) failed to calculate for the following reasons:   The 2019 ASCVD risk score is only valid for ages 64 to 92     Social History   Tobacco Use  Smoking Status Never  Smokeless Tobacco Never  Tobacco Comments   tried for a few months in college.    BP Readings from Last 3 Encounters:  02/27/22 118/62  11/15/21 106/60  10/28/21 126/68   Pulse Readings from Last 3 Encounters:  02/27/22 86  11/15/21 72  10/28/21 69   Wt Readings from Last 3 Encounters:  11/15/21 159 lb 2 oz (72.2 kg)   09/02/21 156 lb 1.4 oz (70.8 kg)  09/02/21 156 lb (70.8 kg)    Assessment: Review of patient past medical history, allergies, medications, health status, including review of consultants reports, laboratory and other test data, was performed as part of comprehensive evaluation and provision of chronic care management services.   SDOH:  (Social Determinants of Health) assessments and interventions performed:     CCM Care Plan  Allergies  Allergen Reactions   Cymbalta [Duloxetine Hcl] Swelling    Swelling in legs   Gabapentin Swelling    Swelling in legs Swelling in legs   Silicone Hives, Itching, Dermatitis and Rash   Metformin And Related Other (See Comments)    dizzy, tired, chills, diarrhea, and nausea   Tramadol Swelling   Adhesive [Tape] Rash    rash rash   Cefazolin Hives    Hives Hives    Ciprofloxacin Other (See Comments)    Other reaction(s): Other (See Comments) Per pt, caused body aches Per pt, caused body aches    Clorazepate Dipotassium Other (See Comments)    Unknown reaction   Dilaudid [Hydromorphone Hcl] Other (See Comments)     confused, intense itching   Doxycycline Rash   Enablex [Darifenacin Hydrobromide Er] Other (See Comments)    Hypotension, near syncope   Levofloxacin Other (See Comments)    Causes wrist pain   Lyrica [Pregabalin] Swelling    Swelling in legs   Methadone Hcl Other (See Comments)    Reaction unknown   Morphine And Related Other (See Comments)    Confusion, constipation.    Talwin [Pentazocine] Other (See Comments)    "climbing walls" anxiety    Medications Reviewed Today     Reviewed by Cherre Robins, RPH-CPP (Pharmacist) on 03/13/22 at [facility] List Status: <None>   Medication Order Taking? Sig Documenting Provider Last Dose Status Informant  Alpha-Lipoic Acid 600 MG CAPS [id number] Yes Take 1,200 mg by mouth daily. [provider] Taking Active Multiple Informants  amitriptyline (ELAVIL) 25 MG tablet  [id number] Yes Take 1 tablet (25 mg total) by mouth at bedtime. Paz,  Alda Berthold, MD Taking Active   Ascorbic Acid (VITAMIN C WITH ROSE HIPS) 500 MG tablet [id number] Yes Take 500 mg by mouth in the morning and at bedtime. [provider] Taking Active Multiple Informants  atorvastatin (LIPITOR) 40 MG tablet [id number] Yes Take 40 mg by mouth daily. [provider] Taking Active Multiple Informants  carvedilol (COREG) 12.5 MG tablet [id number] Yes Take 12.5 mg by mouth 2 (two) times daily with a meal. [provider] Taking Active Multiple Informants  Cholecalciferol (VITAMIN D3) 5000 UNITS CAPS [id number] Yes Take 5,000 Units by mouth every evening. Reported on 11/24/2015 [provider] Taking Active Multiple Informants           Med Note Rosemarie Beath, MELISSA B   Wed Feb 16, 2016  7:01 PM)    CINNAMON PO [id number] Yes Take 2,000 mg by mouth daily. [provider] Taking Active   clopidogrel (PLAVIX) 75 MG tablet [id number] Yes Take 1 tablet (75 mg total) by mouth at bedtime. Restart on 12/04/2020 Viona Gilmore D, NP Taking Active   Cyanocobalamin (VITAMIN B-12) 5000 MCG SUBL [id number] Yes Place 5,000 mcg under the tongue daily. [provider] Taking Active Multiple Informants  dextromethorphan-guaiFENesin (TUSSIN DM) 10-100 MG/5ML liquid [id number] Yes Take by mouth every 4 (four) hours as needed for cough. [provider] Taking Active   diclofenac Sodium (VOLTAREN) 1 % GEL [id number] Yes Apply 4 g topically 4 (four) times daily.  Patient taking differently: Apply 4 g topically 4 (four) times daily. If needed for pain   Deno Etienne, DO Taking Active   dicyclomine (BENTYL) 10 MG capsule [id number] Yes TAKE 1 CAPSULE THREE TIMES A DAY BEFORE MEALS (NEED APPOINTMENT) Mauri Pole, MD Taking Active   donepezil (ARICEPT) 10 MG tablet [id number] Yes Take 1 tablet (10 mg total) by mouth at bedtime. Colon Branch, MD Taking Active   famotidine (PEPCID) 20 MG  tablet [id number] Yes Take 1 tablet (20 mg total) by mouth 2 (two) times daily. Colon Branch, MD Taking Active   ferrous sulfate (FEROSUL) 325 (65 FE) MG tablet [id number] Yes Take 1 tablet (325 mg total) by mouth 2 (two) times daily with a meal. Colon Branch, MD Taking Active   furosemide (LASIX) 20 MG tablet [id number] Yes Take 20 mg by mouth daily. [provider] Taking Active Multiple Informants  Galcanezumab-gnlm [facility]) 120 MG/ML SOAJ [id number] No Inject 120 mg into the skin every 30 (thirty) days.  Patient not taking: Reported on 02/27/2022   Melvenia Beam, MD Not Taking Active   glucose blood (FREESTYLE LITE) test strip [id number] Yes USE TO CHECK BLOOD SUGAR NO MORE THAN TWICE DAILY Colon Branch, MD Taking Active Multiple Informants  HYDROcodone-acetaminophen (NORCO) 10-325 MG tablet [id number] Yes Take 1 tablet by mouth every 6 (six) hours as needed. Colon Branch, MD Taking Active   hydrocortisone (ANUSOL-HC) 2.5 % rectal cream [id number] Yes Place 1 application rectally 2 (two) times daily. As needed Mauri Pole, MD Taking Active   hydrocortisone (ANUSOL-HC) 25 MG suppository [id number] Yes Place 1 suppository (25 mg total) rectally 2 (two) times daily. As needed Mauri Pole, MD Taking Active   ketoconazole (NIZORAL) 2 % cream [id number] Yes Apply 1 application. topically daily. Colon Branch, MD Taking Active   Lancets (FREESTYLE) lancets [id number] Yes CHECK BLOOD SUGAR TWICE DAILY Colon Branch, MD Taking Active Multiple Informants  linaclotide [facility]) 72 MCG capsule [id number] No Take 1 capsule (72 mcg total)  by mouth daily before breakfast.  Patient not taking: Reported on 02/27/2022   Mauri Pole, MD Not Taking Active   loperamide (IMODIUM A-D) 2 MG tablet [id number] Yes Take 2 mg by mouth as needed for diarrhea or loose stools. [provider] Taking Active Multiple Informants  MegaRed Omega-3 [place] [address] [place] CAPS [id number] Yes Restart on 12/04/2020  Viona Gilmore D, NP Taking Active   methenamine (HIPREX) 1 g tablet [id number] Yes Take 0.5 tablets by mouth daily. [provider] Taking Active Multiple Informants  Multiple Vitamins-Minerals (PRESERVISION AREDS 2) CAPS [id number] Yes Take 1 capsule by mouth 2 (two) times daily. [provider] Taking Active Multiple Informants  naloxone [facility]) nasal spray 4 mg/0.1 mL [id number] No Place 1 spray into the nose once as needed for up to 1 dose.  Patient not taking: Reported on 11/21/2021   Colon Branch, MD Not Taking Active   nitroGLYCERIN (NITROSTAT) 0.4 MG SL tablet [id number] No Place 1 tablet (0.4 mg total) under the tongue every 5 (five) minutes x 3 doses as needed for chest pain.  Patient not taking: Reported on 02/27/2022   Colon Branch, MD Not Taking Active            Med Note [facility], Vilma Prader D   Fri Jun 11, 2020 10:20 AM) PRN  omeprazole (PRILOSEC) 20 MG capsule [id number] Yes Take 1 capsule (20 mg total) by mouth 2 (two) times daily before a meal. Mauri Pole, MD Taking Active   ondansetron (ZOFRAN) 4 MG tablet [id number] Yes TAKE 1 TABLET(4 MG) BY MOUTH EVERY 8 HOURS AS NEEDED FOR NAUSEA OR VOMITING Colon Branch, MD Taking Active   OVER THE COUNTER MEDICATION [id number] Yes 1 tablet 3 (three) times daily. Berberorine Gluco Defense [provider] Taking Active Multiple Informants  Polyvinyl Alcohol-Povidone (REFRESH OP) [id number] Yes Place 1 drop into both eyes 3 (three) times daily as needed (dry eyes).  [provider] Taking Active Multiple Informants  pramipexole (MIRAPEX) 0.125 MG tablet [id number] Yes TAKE 1 TO 2 TABLETS(0.125 TO 0.25 MG) BY MOUTH AT BEDTIME AS NEEDED Colon Branch, MD Taking Active   Probiotic Product (PROBIOTIC PEARLS PO) [id number] Yes Take 1 tablet by mouth daily. [provider] Taking Active   rivaroxaban (XARELTO) 2.5 MG TABS tablet [id number] Yes Take 1 tablet (2.5 mg total) by mouth 2 (two) times daily. Restart on  12/04/2020 Viona Gilmore D, NP Taking Active   silver sulfADIAZINE (SILVADENE) 1 % cream [id number] Yes Apply 1 application topically daily. Suzan Slick, NP Taking Active   vitamin E 180 MG (400 UNITS) capsule [id number] Yes Take 400 Units by mouth daily. [provider] Taking Active Multiple Informants            Patient Active Problem List   Diagnosis Date Noted   Dementia ([place]) 10/30/2021   Chronic migraine without aura, with intractable migraine, so stated, with status migrainosus 08/17/2021   Cervical myelopathy ([place]) 11/29/2020   Chronic idiopathic constipation 11/12/2020   Lumbar compression fracture ([place]) 09/29/2020   Other spondylosis with radiculopathy, cervical region 09/29/2020   External hemorrhoids 02/15/2018   History of three vessel coronary artery bypass - 2017 , in [facility] 09/25/2017   PVD (peripheral vascular disease) ([place])-- stent L leg 06-2017 [facility] 07/23/2017   Phantom pain ([place]) 04/01/2017   Constipation, chronic 04/01/2017   S/P unilateral BKA (below knee amputation), right ([place]) 12/14/2016   Toe osteomyelitis, left ([place]) 11/17/2016  Acute on chronic diastolic CHF (congestive heart failure), NYHA class 3 (HCC) 03/30/2016   Coronary artery disease due to lipid rich plaque 03/30/2016   Acute renal failure superimposed on stage 3 chronic kidney disease ([place]) 03/30/2016   S/P below knee amputation ([place]) 02/18/2016   Memory loss 11/26/2015   PCP NOTES >>>>>>>>>>>>>>>>>>>> 10/18/2015   S/P partial colectomy 01/08/2015   Diarrhea 01/29/2014   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 01/28/2014   Anemia 01/18/2014   CAD (coronary artery disease)    Carcinoma in situ in a polyp    Depression 09/17/2013   Charcot's joint 08/21/2013   Foot osteomyelitis, right ([place]) 07/16/2013   Chronic pain associated with significant psychosocial dysfunction 05/06/2013   Neuropathy    Hyperthyroidism    Lower extremity edema 03/08/2011   Annual  physical exam 03/08/2011   Tachycardia 12/07/2010   ALLERGIC RHINITIS 04/15/2010   ORTHOSTATIC DIZZINESS 04/13/2010   HIATAL HERNIA 12/06/2009   GASTRIC ULCER, HX OF 12/06/2009   DYSPNEA 03/18/2009   DEGENERATIVE JOINT DISEASE 06/03/2008   COLONIC POLYPS 01/27/2008   DIVERTICULOSIS, COLON 01/27/2008   IRRITABLE BOWEL SYNDROME, HX OF 01/27/2008   Pulmonary nodule, right 01/22/2008   Hyperlipidemia 12/30/2007   Osteoporosis 12/30/2007   TB SKIN TEST, POSITIVE 08/09/2007   DM type 2 with diabetic peripheral neuropathy ([place]) 05/09/2007   Reflex sympathetic dystrophy-- PAIN MNGMT  05/09/2007   Essential hypertension 04/15/2007   GERD 04/15/2007    Immunization History  Administered Date(s) Administered   Fluad Quad(high Dose 65+) 07/11/2019, 09/22/2020, 07/27/2021   Influenza Split 08/07/2012   Influenza Whole 07/22/2008, 07/26/2009   Influenza, High Dose Seasonal PF 10/15/2015, 09/18/2016, 07/23/2017, 08/21/2018   Influenza, Seasonal, Injecte, Preservative Fre 07/23/2013   Influenza,inj,Quad PF,6+ Mos 10/06/2014   PFIZER(Purple Top)SARS-COV-2 Vaccination 12/27/2019, 01/31/2020, 11/01/2020   Pneumococcal Conjugate-13 10/06/2014   Pneumococcal Polysaccharide-23 09/28/2010   Tetanus 09/17/2013   Zoster Recombinat (Shingrix) 10/11/2018, 12/12/2018   Zoster, Live 09/18/2011    Conditions to be addressed/monitored: CHF, CAD, HTN, HLD, DMII, Dementia, Osteoporosis, and poly pharmacy; neuropathy; constipation; chronic pain  Care Plan : Chronic Conditions: chronic pain, edema; dementia; chronic constipation, headaches; amputations due to Charcot foot; diabetes; neuropathy; osteoporosis; recurrent UTIs; CAD; HDL;  Updates made by Cherre Robins, RPH-CPP since 03/13/2022 12:00 AM     Problem: Chroncin Conditions: CAD, HDL, NSTEMI, type 2 DM, amputations due to Charcots foot; osteoporosis; recurrent UTIs; chronic pain; chronic constipation; headhaches; dementia; osteoarthritis       Long-Range Goal: Provide education, support and care coordination for medication therapy and chronic conditions   Start Date: 11/21/2021  This Visit's Progress: On track  Priority: High  Note:   Current Barriers:  Unable to self administer medications as prescribed Taking several over-the-counter vitamin supplements.   Pharmacist Clinical Goal(s):  Over the next 90 days, patient will achieve adherence to monitoring guidelines and medication adherence to achieve therapeutic efficacy adhere to prescribed medication regimen as evidenced by refill history  Reviewed medication therapy, including over-the-counter and vitamin Supplements; assess for potential side effect, drug-drug interactions and need for monitoring through collaboration with PharmD and provider.   Interventions: 1:1 collaboration with Colon Branch, MD regarding development and update of comprehensive plan of care as evidenced by provider attestation and co-signature Inter-disciplinary care team collaboration (see longitudinal plan of care) Comprehensive medication review performed; medication list updated in electronic medical record  Diabetes: Controlled: LDL goal < 7.0% Current treatment: Cinnamon 4038m daily   Current glucose readings: only checks  once weekly  Denies hypoglycemic/hyperglycemic symptoms Current exercise: None Interventions:  Reviewed dose of cinnamon Reviewed home blood glucose goals  Fasting blood glucose goal (before meals) = 80 to 130 Blood glucose goal after a meal = less than 180   Hypertension / CAD / Hyperlipidemia / CHF: Controlled; LDL goal < 55 and blood pressure goal < 140/90 Patient has history of NSTEMI; PVD Cardiologist in [place] Current treatment: Xarelto 2.28m twice a day Clopidogrel 753mdaily Nitroglycerin 0.67m80ms needed for chest pain Atorvastatin 70m57mily  Carvedilol 12.5mg 467mce a day Furosemide 20mg 58my  Son helps monitor weight and CHF symptoms Weight  has been Gould over the last month.  Interventions: (addressed at previous visit) Discussed LDL goal Discussed signs and symptoms of CHF exacerbation - weight gain, SOB, abdominal fullness, swelling in legs or abdomen, Fatigue and weakness, changes in ability to perform usual activities, persistent cough or wheezing with white or pink blood-tinged mucus, nausea and lack of appetite Continue to weigh daily - report weight gain of more than 3 lbs in 24 hours or 5 lbs in 1 week. Continue current medications  Osteoporosis:  Goal: prevent falls and fractures Current therapy:  Vitamin D 5000IU daily Prolia 60mg i59mted subcutaneously every 6 months ( Last vitamin D level checked several years ago [facility]2 (previous score 01/08/2017 was -3.3) - improved T-Score for total left hip = -1.3 (previous score 01/08/2017 was -1.7) - improved T-Score for left radius = -3.4 (previous score 01/08/2017 was -3.2) - slightly decreased Interventions:  Consider rechecking serum vitamin D since patient is on high dose vitamin D Supplement Continue Prolia injections Fall prevention  Dementia: Goal: slow progression of memory loss and assist patient and caregiver with adjustments in ADLs Current therapy:  Donepezil 10mg at37mtime B12 supplement - 5000mcg on26maily Last serum B12 level was > 1500.  Interventions:  Recommended decrease B12 supplement to 1000mcg to 55mmcg dail667meck serum B12 at next visit.  Continue donepezil  Medication management Goal: maintain optimal medication adherence Interventions Comprehensive medication review performed. Reviewed refill history and assessed adherence Assessed for side effects and drug-drug interactions Provided education to patient and caregiver regarding each medication, indications and proper administration  Discussed iron supplementation  Patient Goals/Self-Care Activities Over the next 90  days, patient will:  take medications as prescribed,  check glucose weekly, document, and provide at future appointments,  weigh daily, and contact provider if weight gain of more than 3lbs in 24 hours or 5lbs in 1 week, and  decrease B12 supplement dose to 1000mcg to 2588mg daily 47mmmend ferrous sulfate 325mg (65mg [id number] monitor for constipation from iron supplement. May restart Linzess 72mcg - take ev33mother day to daily if needed.   Follow Up Plan: No further follow up required: Patient's son is aware they can contact Clinical Pharmacist Practitioner at anytime in future for questions or concerns.          Medication Assistance: None required.  Patient affirms current coverage meets needs.  Patient's preferred pharmacy is:  [facility] [place] [facility] [id number] [phone]  [facility] [id number] [phone]  [facility] [place]  [address] [place] [address] [place] [address] Phone: [phone] Fax: [phone]    Follow Up Plan: No further follow up required: Patient's son is aware they can contact Clinical Pharmacist Practitioner at anytime in future for questions or concerns.  Cherre Robins, PharmD Clinical Pharmacist [facility]

## 2022-03-13 NOTE — Patient Instructions (Signed)
[name] It was a pleasure speaking with you  Below is a summary of your health goals and care plan  If you have any questions or concerns, please feel free to contact me either at the phone number below or with a MyChart message.   Keep up the good work!  Cherre Robins, PharmD Clinical Pharmacist [facility] [phone] (direct line)  [phone] (main office number)   Pharmacy Chronic Care Management Care Plan  Diabetes: Controlled: LDL goal < 7.0%  Lab Results  Component Value Date   HGBA1C 5.1 02/27/2022   Current treatment: Cinnamon '4000mg'$  daily   Interventions:  Continue to check blood glucose once weekly Reviewed dose of cinnamon Reviewed home blood glucose goals  Fasting blood glucose goal (before meals) = 80 to 130 Blood glucose goal after a meal = less than 180   High blood pressure / heart disease / elevated cholesterol  Controlled; LDL goal < 55 and blood pressure goal < 140/90  BP Readings from Last 3 Encounters:  02/27/22 118/62  11/15/21 106/60  10/28/21 126/68   Lab Results  Component Value Date   CHOL 101 02/27/2022   HDL 50.00 02/27/2022   LDLCALC 31 02/27/2022   LDLDIRECT 44.0 02/28/2019   TRIG 100.0 02/27/2022   CHOLHDL 2 02/27/2022   Current treatment: Xarelto 2.'5mg'$  twice a day Clopidogrel '75mg'$  daily Nitroglycerin 0.'4mg'$  as needed for chest pain Atorvastatin '40mg'$  daily  Carvedilol 12.'5mg'$  twice a day Furosemide '20mg'$  daily  Interventions:  Dicussed LDL goal Discussed signs and symptoms of CHF exacerbation - weight gain, SOB, abdominal fullness, swelling in legs or abdomen, Fatigue and weakness, changes in ability to perform usual activities, persistent cough or wheezing with white or pink blood-tinged mucus, nausea and lack of appetite. Contact cardiologist or primary care provider if you notice these symptoms Continue to weigh daily - report weight gain of more than 3 lbs in 24 hours or 5 lbs in 1  week. Continue current medications  Osteoporosis:  Goal: prevent falls and fractures Current therapy:  Vitamin D 5000IU daily Prolia '60mg'$  injected subcutaneously every 6 months  Last Done Density 03/08/20[facility] at [facility]: T-Score for total right hip = -2.2 (previous score 01/08/2017 was -3.3) - improved T-Score for total left hip = -1.3 (previous score 01/08/2017 was -1.7) - improved T-Score for left radius = -3.4 (previous score 01/08/2017 was -3.2) - slightly decreased Interventions:  Consider rechecking serum vitamin D with next labs since patient is on high dose vitamin D Supplement Continue Prolia injections every 6 months Fall prevention  Memory Changes: Goal: slow progression of memory loss Current therapy:  Donepezil '10mg'$  at bedtime B12 supplement - 5071mg once daily Last serum B12 level was > 1500.  Interventions:  Recommended decrease B12 supplement to 1000 to 25023m daily  Check serum B12 at next visit.  Continue donepezil  Medication management Goal: maintain optimal medication adherence Interventions Comprehensive medication review performed. Reviewed refill history and assessed adherence Assessed for side effects and drug-drug interactions Provided education to patient and caregiver regarding medication, indications and proper administration   Patient Goals/Self-Care Activities Over the next 90 days, patient will:  take medications as prescribed,  check glucose weekly, document, and provide at future appointments,  weigh daily, and contact provider if weight gain of more than 3lbs in 24 hours or 5lbs in 1 week, and  decrease B12 supplement dose to 100089mto 2500m24maily Recommend ferrous sulfate '325mg'$  ('65mg'$  iron) daily - monitor for constipation  from iron supplement. May restart Linzess 51mg - take every other day to daily if needed.   Follow Up Plan: No further follow up required: Patient's son is aware they can contact Clinical Pharmacist Practitioner at  anytime in future for questions or concerns.

## 2022-03-14 DIAGNOSIS — Z1231 Encounter for screening mammogram for malignant neoplasm of breast: Secondary | ICD-10-CM | POA: Diagnosis not present

## 2022-03-14 LAB — HM MAMMOGRAPHY

## 2022-03-15 ENCOUNTER — Encounter: Payer: Self-pay | Admitting: Internal Medicine

## 2022-03-21 DIAGNOSIS — I70213 Atherosclerosis of native arteries of extremities with intermittent claudication, bilateral legs: Secondary | ICD-10-CM | POA: Diagnosis not present

## 2022-03-22 ENCOUNTER — Ambulatory Visit: Payer: Medicare Other

## 2022-03-24 DIAGNOSIS — I70213 Atherosclerosis of native arteries of extremities with intermittent claudication, bilateral legs: Secondary | ICD-10-CM | POA: Diagnosis not present

## 2022-03-27 ENCOUNTER — Encounter: Payer: Self-pay | Admitting: Physician Assistant

## 2022-03-27 ENCOUNTER — Other Ambulatory Visit (INDEPENDENT_AMBULATORY_CARE_PROVIDER_SITE_OTHER): Payer: Medicare Other

## 2022-03-27 ENCOUNTER — Ambulatory Visit (INDEPENDENT_AMBULATORY_CARE_PROVIDER_SITE_OTHER): Payer: Medicare Other | Admitting: Physician Assistant

## 2022-03-27 VITALS — BP 128/74 | HR 91 | Ht 64.0 in | Wt 161.0 lb

## 2022-03-27 DIAGNOSIS — K219 Gastro-esophageal reflux disease without esophagitis: Secondary | ICD-10-CM

## 2022-03-27 DIAGNOSIS — D649 Anemia, unspecified: Secondary | ICD-10-CM

## 2022-03-27 DIAGNOSIS — Z86010 Personal history of colonic polyps: Secondary | ICD-10-CM | POA: Diagnosis not present

## 2022-03-27 DIAGNOSIS — I2510 Atherosclerotic heart disease of native coronary artery without angina pectoris: Secondary | ICD-10-CM

## 2022-03-27 DIAGNOSIS — Z7901 Long term (current) use of anticoagulants: Secondary | ICD-10-CM

## 2022-03-27 LAB — IBC + FERRITIN
Ferritin: 25.1 ng/mL (ref 10.0–291.0)
Iron: 51 ug/dL (ref 42–145)
Saturation Ratios: 16.3 % — ABNORMAL LOW (ref 20.0–50.0)
TIBC: 312.2 ug/dL (ref 250.0–450.0)
Transferrin: 223 mg/dL (ref 212.0–360.0)

## 2022-03-27 LAB — BASIC METABOLIC PANEL
BUN: 25 mg/dL — ABNORMAL HIGH (ref 6–23)
CO2: 25 mEq/L (ref 19–32)
Calcium: 8.7 mg/dL (ref 8.4–10.5)
Chloride: 109 mEq/L (ref 96–112)
Creatinine, Ser: 1.18 mg/dL (ref 0.40–1.20)
GFR: 42.33 mL/min — ABNORMAL LOW (ref >60.00)
Glucose, Bld: 100 mg/dL — ABNORMAL HIGH (ref 70–99)
Potassium: 4.4 mEq/L (ref 3.5–5.1)
Sodium: 141 mEq/L (ref 135–145)

## 2022-03-27 LAB — B12 AND FOLATE PANEL
Folate: 23.3 ng/mL (ref >5.9)
Vitamin B-12: >1504 pg/mL — ABNORMAL HIGH (ref 211–911)

## 2022-03-27 LAB — CBC WITH DIFFERENTIAL/PLATELET
Basophils Absolute: 0 10*3/uL (ref 0.0–0.1)
Basophils Relative: 0.5 % (ref 0.0–3.0)
Eosinophils Absolute: 0.1 10*3/uL (ref 0.0–0.7)
Eosinophils Relative: 3.6 % (ref 0.0–5.0)
HCT: 31 % — ABNORMAL LOW (ref 36.0–46.0)
Hemoglobin: 10.1 g/dL — ABNORMAL LOW (ref 12.0–15.0)
Lymphocytes Relative: 41.2 % (ref 12.0–46.0)
Lymphs Abs: 1.4 10*3/uL (ref 0.7–4.0)
MCHC: 32.8 g/dL (ref 30.0–36.0)
MCV: 93.3 fl (ref 78.0–100.0)
Monocytes Absolute: 0.3 10*3/uL (ref 0.1–1.0)
Monocytes Relative: 8.3 % (ref 3.0–12.0)
Neutro Abs: 1.5 10*3/uL (ref 1.4–7.7)
Neutrophils Relative %: 46.4 % (ref 43.0–77.0)
Platelets: 119 10*3/uL — ABNORMAL LOW (ref 150.0–400.0)
RBC: 3.32 Mil/uL — ABNORMAL LOW (ref 3.87–5.11)
RDW: 14.9 % (ref 11.5–15.5)
WBC: 3.3 10*3/uL — ABNORMAL LOW (ref 4.0–10.5)

## 2022-03-27 NOTE — Progress Notes (Unsigned)
Subjective:    Patient ID: [name], female    DOB: 11-07-1934, 87 y.o.   MRN: [id number]  HPI [name] is a pleasant 87 year old white female, established with Dr. Silverio Decamp.  She was last seen in January 2023 for follow-up of IBS-C, history of internal hemorrhoids, status post banding in 2017. She also has history of coronary artery disease, status post MI, and CABG 2017.  Significant history of peripheral vascular disease status post BKA of the right lower extremity, on chronic Xarelto, she had a remote Nissen fundoplication in 1308, she is status post right hemicolectomy, and has history of dementia.  She is referred today by Dr. Larose Kells for evaluation of anemia, normocytic. Most recent labs were done 02/27/2022 with hemoglobin 10.7 hematocrit 32.6/platelets 132 Reviewing prior labs November 2022 hemoglobin 11.3/hematocrit 35/platelets 131 August 2022 hemoglobin 11.5/hematocrit 37/MCV 97/platelets 131.  Her son relates that she does have history of iron deficiency and B12 deficiency.  Apparently her last B12 level was high and they have decreased the B12 oral supplement from once daily to every other day.  She is currently on ferrous sulfate 325 mg p.o. twice daily.  She has not had recent iron or B12 studies done.  Patient says that she does see occasional bright red blood on the tissue, or sometimes streaked on a bowel movement but has noticed this for a long time at least over the past year or so.  Stools stay darker on iron.  She has recently noted some intermittent right lower quadrant pain over the past 3 to 4 weeks, not severe.  She does continue to have chronic problems with constipation unfortunately the Ballinger worked very well and causes her to have a complete bowel purge even with 1 dose of 72 mcg.  Her son has just been giving this to her on a as needed basis if she does not have a bowel movement for 4 to 5 days. She has recently undergone another peripheral vascular procedure on  03/08/2022 in [facility], did not require a new stent but had, cleanout of one of her old stents. She is maintained chronically on omeprazole 20 mg p.o. twice daily for GERD. Last EGD was done in 2015 with intact Nissen and otherwise negative exam.  Last colonoscopy March 2017 with internal and external hemorrhoids, left-sided diverticulosis and 3 small adenomatous polyps were removed. She had CT a year ago May 2022 showing right hemicolectomy, and IVC filter in place, left common iliac vein stent noted, otherwise unremarkable.   Review of Systems Pertinent positive and negative review of systems were noted in the above HPI section.  All other review of systems was otherwise negative.   Outpatient Encounter Medications as of 03/27/2022  Medication Sig   Alpha-Lipoic Acid 600 MG CAPS Take 1,200 mg by mouth daily.   amitriptyline (ELAVIL) 25 MG tablet Take 1 tablet (25 mg total) by mouth at bedtime.   Ascorbic Acid (VITAMIN C WITH ROSE HIPS) 500 MG tablet Take 500 mg by mouth in the morning and at bedtime.   atorvastatin (LIPITOR) 40 MG tablet Take 40 mg by mouth daily.   carvedilol (COREG) 12.5 MG tablet Take 12.5 mg by mouth 2 (two) times daily with a meal.   Cholecalciferol (VITAMIN D3) 5000 UNITS CAPS Take 5,000 Units by mouth every evening. Reported on 11/24/2015   CINNAMON PO Take 2,000 mg by mouth daily.   clopidogrel (PLAVIX) 75 MG tablet Take 1 tablet (75 mg total) by mouth at bedtime. Restart  on 12/04/2020   Cyanocobalamin (VITAMIN B-12) 5000 MCG SUBL Place 5,000 mcg under the tongue daily.   dextromethorphan-guaiFENesin (TUSSIN DM) 10-100 MG/5ML liquid Take by mouth every 4 (four) hours as needed for cough.   diclofenac Sodium (VOLTAREN) 1 % GEL Apply 4 g topically 4 (four) times daily. (Patient taking differently: Apply 4 g topically 4 (four) times daily. If needed for pain)   dicyclomine (BENTYL) 10 MG capsule TAKE 1 CAPSULE THREE TIMES A DAY BEFORE MEALS (NEED APPOINTMENT)   donepezil  (ARICEPT) 10 MG tablet Take 1 tablet (10 mg total) by mouth at bedtime.   famotidine (PEPCID) 20 MG tablet Take 1 tablet (20 mg total) by mouth 2 (two) times daily.   ferrous sulfate (FEROSUL) 325 (65 FE) MG tablet Take 1 tablet (325 mg total) by mouth 2 (two) times daily with a meal.   furosemide (LASIX) 20 MG tablet Take 20 mg by mouth daily.   Galcanezumab-gnlm (EMGALITY) 120 MG/ML SOAJ Inject 120 mg into the skin every 30 (thirty) days.   glucose blood (FREESTYLE LITE) test strip USE TO CHECK BLOOD SUGAR NO MORE THAN TWICE DAILY   HYDROcodone-acetaminophen (NORCO) 10-325 MG tablet Take 1 tablet by mouth every 6 (six) hours as needed.   hydrocortisone (ANUSOL-HC) 2.5 % rectal cream Place 1 application rectally 2 (two) times daily. As needed   hydrocortisone (ANUSOL-HC) 25 MG suppository Place 1 suppository (25 mg total) rectally 2 (two) times daily. As needed   ketoconazole (NIZORAL) 2 % cream Apply 1 application. topically daily.   Lancets (FREESTYLE) lancets CHECK BLOOD SUGAR TWICE DAILY   linaclotide (LINZESS) 72 MCG capsule Take 1 capsule (72 mcg total) by mouth daily before breakfast.   loperamide (IMODIUM A-D) 2 MG tablet Take 2 mg by mouth as needed for diarrhea or loose stools.   MegaRed Omega-3 Krill Oil 500 MG CAPS Restart on 12/04/2020   methenamine (HIPREX) 1 g tablet Take 0.5 tablets by mouth daily.   Multiple Vitamins-Minerals (PRESERVISION AREDS 2) CAPS Take 1 capsule by mouth 2 (two) times daily.   naloxone (NARCAN) nasal spray 4 mg/0.1 mL Place 1 spray into the nose once as needed for up to 1 dose.   nitroGLYCERIN (NITROSTAT) 0.4 MG SL tablet Place 1 tablet (0.4 mg total) under the tongue every 5 (five) minutes x 3 doses as needed for chest pain.   omeprazole (PRILOSEC) 20 MG capsule Take 1 capsule (20 mg total) by mouth 2 (two) times daily before a meal.   ondansetron (ZOFRAN) 4 MG tablet TAKE 1 TABLET(4 MG) BY MOUTH EVERY 8 HOURS AS NEEDED FOR NAUSEA OR VOMITING   OVER THE  COUNTER MEDICATION 1 tablet 3 (three) times daily. Berberorine Gluco Defense   Polyvinyl Alcohol-Povidone (REFRESH OP) Place 1 drop into both eyes 3 (three) times daily as needed (dry eyes).    pramipexole (MIRAPEX) 0.125 MG tablet TAKE 1 TO 2 TABLETS(0.125 TO 0.25 MG) BY MOUTH AT BEDTIME AS NEEDED   Probiotic Product (PROBIOTIC PEARLS PO) Take 1 tablet by mouth daily.   rivaroxaban (XARELTO) 2.5 MG TABS tablet Take 1 tablet (2.5 mg total) by mouth 2 (two) times daily. Restart on 12/04/2020   silver sulfADIAZINE (SILVADENE) 1 % cream Apply 1 application topically daily.   vitamin E 180 MG (400 UNITS) capsule Take 400 Units by mouth daily.   No facility-administered encounter medications on file as of 03/27/2022.   Allergies  Allergen Reactions   Cymbalta [Duloxetine Hcl] Swelling    Swelling in legs  Gabapentin Swelling    Swelling in legs Swelling in legs   Silicone Hives, Itching, Dermatitis and Rash   Metformin And Related Other (See Comments)    dizzy, tired, chills, diarrhea, and nausea   Tramadol Swelling   Adhesive [Tape] Rash    rash rash   Cefazolin Hives    Hives Hives    Ciprofloxacin Other (See Comments)    Other reaction(s): Other (See Comments) Per pt, caused body aches Per pt, caused body aches    Clorazepate Dipotassium Other (See Comments)    Unknown reaction   Dilaudid [Hydromorphone Hcl] Other (See Comments)     confused, intense itching   Doxycycline Rash   Enablex [Darifenacin Hydrobromide Er] Other (See Comments)    Hypotension, near syncope   Levofloxacin Other (See Comments)    Causes wrist pain   Lyrica [Pregabalin] Swelling    Swelling in legs   Methadone Hcl Other (See Comments)    Reaction unknown   Morphine And Related Other (See Comments)    Confusion, constipation.    Talwin [Pentazocine] Other (See Comments)    "climbing walls" anxiety   Patient Active Problem List   Diagnosis Date Noted   Dementia ([place]) 10/30/2021   Chronic  migraine without aura, with intractable migraine, so stated, with status migrainosus 08/17/2021   Cervical myelopathy ([place]) 11/29/2020   Chronic idiopathic constipation 11/12/2020   Lumbar compression fracture ([place]) 09/29/2020   Other spondylosis with radiculopathy, cervical region 09/29/2020   External hemorrhoids 02/15/2018   History of three vessel coronary artery bypass - 2017 , in [facility] 09/25/2017   PVD (peripheral vascular disease) ([place])-- stent L leg 06-2017 Dr Feliberto [facility] 07/23/2017   Phantom pain ([place]) 04/01/2017   Constipation, chronic 04/01/2017   S/P unilateral BKA (below knee amputation), right ([place]) 12/14/2016   Toe osteomyelitis, left ([place]) 11/17/2016   Acute on chronic diastolic CHF (congestive heart failure), NYHA class 3 ([place]) 03/30/2016   Coronary artery disease due to lipid rich plaque 03/30/2016   Acute renal failure superimposed on stage 3 chronic kidney disease ([place]) 03/30/2016   S/P below knee amputation ([place]) 02/18/2016   Memory loss 11/26/2015   PCP NOTES >>>>>>>>>>>>>>>>>>>> 10/18/2015   S/P partial colectomy 01/08/2015   Diarrhea 01/29/2014   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 01/28/2014   Anemia 01/18/2014   CAD (coronary artery disease)    Carcinoma in situ in a polyp    Depression 09/17/2013   Charcot's joint 08/21/2013   Foot osteomyelitis, right ([place]) 07/16/2013   Chronic pain associated with significant psychosocial dysfunction 05/06/2013   Neuropathy    Hyperthyroidism    Lower extremity edema 03/08/2011   Annual physical exam 03/08/2011   Tachycardia 12/07/2010   ALLERGIC RHINITIS 04/15/2010   ORTHOSTATIC DIZZINESS 04/13/2010   HIATAL HERNIA 12/06/2009   GASTRIC ULCER, HX OF 12/06/2009   DYSPNEA 03/18/2009   DEGENERATIVE JOINT DISEASE 06/03/2008   COLONIC POLYPS 01/27/2008   DIVERTICULOSIS, COLON 01/27/2008   IRRITABLE BOWEL SYNDROME, HX OF 01/27/2008   Pulmonary nodule, right 01/22/2008   Hyperlipidemia 12/30/2007    Osteoporosis 12/30/2007   TB SKIN TEST, POSITIVE 08/09/2007   DM type 2 with diabetic peripheral neuropathy ([place]) 05/09/2007   Reflex sympathetic dystrophy-- PAIN MNGMT  05/09/2007   Essential hypertension 04/15/2007   GERD 04/15/2007   Social History   Socioeconomic History   Marital status: Divorced    Spouse name: Not on file   Number of children: 3   Years of education: 30  Highest education level: Not on file  Occupational History   Occupation: Retired     Comment: from social services   Tobacco Use   Smoking status: Never   Smokeless tobacco: Never   Tobacco comments:    tried for a few months in college.   Vaping Use   Vaping Use: Never used  Substance and Sexual Activity   Alcohol use: No    Alcohol/week: 0.0 standard drinks   Drug use: No   Sexual activity: Not Currently    Birth control/protection: Post-menopausal  Other Topics Concern   Not on file  Social History Narrative   Daughter [name] deceased 26-Sep-2020   daughter lives in town   1 son in [place], [place]ia Cuff comes home every 4 weeks to visit   Caffeine use:  Tea 1/day w/ dinner   Coffee occass          Currently lives in [place] with son Rekisha Welling.   Social Determinants of Health   Financial Resource Strain: Low Risk    Difficulty of Paying Living Expenses: Not hard at all  Food Insecurity: Not on file  Transportation Needs: No Transportation Needs   Lack of Transportation (Medical): No   Lack of Transportation (Non-Medical): No  Physical Activity: Not on file  Stress: Not on file  Social Connections: Not on file  Intimate Partner Violence: Not on file    Ms. Lemonds's family history includes Arthritis in her mother; Breast cancer in an other family member; Diabetes in her father; Diabetic kidney disease in her daughter; Heart disease in her mother; Hypertension in an other family member; Migraines in her sister; Stroke in her father.      Objective:    Vitals:   03/27/22 1436  BP:  128/74  Pulse: 91  SpO2: 97%    Physical Exam Well-developed well-nourished elderly white female in no acute distress.  Patient is in a wheelchair, accompanied by her son  Weight,70  BMI 27  HEENT; nontraumatic normocephalic, EOMI, PE R LA, sclera anicteric. Oropharynx; not examined today Neck; supple, no JVD Cardiovascular; regular rate and rhythm with S1-S2, no murmur rub or gallop Pulmonary; Clear bilaterally Abdomen; soft, nontender,-no focal tenderness, nondistended, no palpable mass or hepatosplenomegaly, bowel sounds are active Rectal; not done Skin; benign exam, no jaundice rash or appreciable lesions Extremities; status post right BKA Neuro/Psych; alert and oriented x4, grossly nonfocal mood and affect appropriate        Assessment & Plan:   #52  87 year old white female referred for evaluation of normocytic anemia.  Patient has history of iron deficiency and B12 deficiency, and has been on supplementation for both. Reviewing her labs she has had a mild anemia dating back at least through August 2022  Also note mild associated thrombocytopenia. She has intermittent small-volume hematochezia secondary to previously documented internal and external hemorrhoids  She has numerous comorbidities, and is a poor candidate for endoscopic evaluation at present  Etiology of the mild anemia and thrombocytopenia, not entirely clear,, doubt secondary to chronic GI blood loss,  #2 chronic anticoagulation-on Xarelto 3.  Coronary artery disease status post CABG 2017 4.  Status post right BKA, with significant peripheral vascular disease 5.  Status post right hemicolectomy 6.  Remote Nissen fundoplication 7062 7.  Dementia 8.  Hypertension 9.  GERD 10.  Chronic constipation  Plan; repeat CBC with differential today, B12/folate, iron studies Be met Continue oral B12 q. OD Continue ferrous sulfate 325 mg p.o. twice daily  Start trial of MiraLAX 17 g daily in 8 ounces of water, as  Linzess 72 mcg caused significant diarrhea/bowel purge Depending on results of labs can consider CT imaging, would hope to avoid endoscopic evaluation in this patient with advanced age and numerous comorbidities.  Rajean Desantiago Genia Harold PA-C 03/27/2022   Cc: Colon Branch, MD

## 2022-03-27 NOTE — Patient Instructions (Addendum)
If you are age 85 or older, your body mass index should be between 23-30. Your Body mass index is 27.64 kg/m. If this is out of the aforementioned range listed, please consider follow up with your Primary Care Provider. ________________________________________________________  The [facility] providers would like to encourage you to use [facility] to communicate with providers for non-urgent requests or questions.  Due to long hold times on the telephone, sending your provider a message by [facility] may be a faster and more efficient way to get a response.  Please allow 48 business hours for a response.  Please remember that this is for non-urgent requests.  _______________________________________________________  Your provider has requested that you go to the basement level for lab work before leaving today. Press "B" on the elevator. The lab is located at the first door on the left as you exit the elevator.  Continue using Iron 325 mg twice daily  Continue B12   START Miralax 1 capful in 8 ounces of water or juice daily to help with constipation.  Follow up as needed for now.  Thank you for entrusting me with your care and choosing [facility].  Amy Esterwood, PA-C

## 2022-03-28 ENCOUNTER — Encounter: Payer: Self-pay | Admitting: Orthopedic Surgery

## 2022-03-28 ENCOUNTER — Ambulatory Visit (INDEPENDENT_AMBULATORY_CARE_PROVIDER_SITE_OTHER): Payer: Medicare Other

## 2022-03-28 ENCOUNTER — Ambulatory Visit: Payer: Medicare Other

## 2022-03-28 ENCOUNTER — Telehealth: Payer: Self-pay

## 2022-03-28 DIAGNOSIS — M810 Age-related osteoporosis without current pathological fracture: Secondary | ICD-10-CM | POA: Diagnosis not present

## 2022-03-28 DIAGNOSIS — Z23 Encounter for immunization: Secondary | ICD-10-CM

## 2022-03-28 MED ORDER — HYDROCODONE-ACETAMINOPHEN 10-325 MG PO TABS: 1.0000 | ORAL_TABLET | Freq: Four times a day (QID) | ORAL | 0 refills | Status: DC | PRN

## 2022-03-28 MED ORDER — DENOSUMAB 60 MG/ML ~~LOC~~ SOSY
60.0000 mg | PREFILLED_SYRINGE | Freq: Once | SUBCUTANEOUS | Status: AC
  Administered 2022-03-28: 60 mg via SUBCUTANEOUS

## 2022-03-28 NOTE — Telephone Encounter (Signed)
Requesting: hydrocodone 10-'325mg'$   Contract:02/27/22 UDS:02/27/22 Last Visit: 02/27/22 Next Visit: 07/03/22 Last Refill: 01/23/22 #120 and 0RF  Please Advise

## 2022-03-28 NOTE — Telephone Encounter (Signed)
PDMP okay, Rx sent 

## 2022-03-28 NOTE — Progress Notes (Signed)
[name] is a 87 y.o. female presents to the office today for Prolia injection, per physician's orders. Original order: 03/17/22- Dr Larose Kells: "Continue with Prolia"  Prolia 60 mg SQ, was administered L arm today. Patient tolerated injection. Patient due for follow up labs/provider appt: No. Patient next injection due: 6 months, appt made No- will wait for approval with insurance

## 2022-03-30 DIAGNOSIS — H04123 Dry eye syndrome of bilateral lacrimal glands: Secondary | ICD-10-CM | POA: Diagnosis not present

## 2022-03-30 DIAGNOSIS — H40013 Open angle with borderline findings, low risk, bilateral: Secondary | ICD-10-CM | POA: Diagnosis not present

## 2022-04-03 ENCOUNTER — Ambulatory Visit (INDEPENDENT_AMBULATORY_CARE_PROVIDER_SITE_OTHER): Payer: Medicare Other

## 2022-04-03 VITALS — Ht 64.0 in | Wt 161.0 lb

## 2022-04-03 DIAGNOSIS — Z0000 Encounter for general adult medical examination without abnormal findings: Secondary | ICD-10-CM

## 2022-04-03 NOTE — Patient Instructions (Signed)
[name] , Thank you for taking time to complete your Medicare Wellness Visit. I appreciate your ongoing commitment to your health goals. Please review the following plan we discussed and let me know if I can assist you in the future.   Screening recommendations/referrals: Colonoscopy: No longer required Mammogram: Completed 03/14/2022-Due 03/15/2023 Bone Density: Completed 03/08/2021-Due 03/09/2023 Recommended yearly ophthalmology/optometry visit for glaucoma screening and checkup Recommended yearly dental visit for hygiene and checkup  Vaccinations: Influenza vaccine: Up to date Pneumococcal vaccine: Up to date Tdap vaccine: Up to date Shingles vaccine: Completed vaccines   Covid-19:May obtain booster at your local pharmacy.   Advanced directives: May pick up information at your next visit  Conditions/risks identified: See problem list  Next appointment: Follow up in one year for your annual wellness visit    Preventive Care 65 Years and Older, Female Preventive care refers to lifestyle choices and visits with your health care provider that can promote health and wellness. What does preventive care include? A yearly physical exam. This is also called an annual well check. Dental exams once or twice a year. Routine eye exams. Ask your health care provider how often you should have your eyes checked. Personal lifestyle choices, including: Daily care of your teeth and gums. Regular physical activity. Eating a healthy diet. Avoiding tobacco and drug use. Limiting alcohol use. Practicing safe sex. Taking low-dose aspirin every day. Taking vitamin and mineral supplements as recommended by your health care provider. What happens during an annual well check? The services and screenings done by your health care provider during your annual well check will depend on your age, overall health, lifestyle risk factors, and family history of disease. Counseling  Your health care provider  may ask you questions about your: Alcohol use. Tobacco use. Drug use. Emotional well-being. Home and relationship well-being. Sexual activity. Eating habits. History of falls. Memory and ability to understand (cognition). Work and work Statistician. Reproductive health. Screening  You may have the following tests or measurements: Height, weight, and BMI. Blood pressure. Lipid and cholesterol levels. These may be checked every 5 years, or more frequently if you are over 32 years old. Skin check. Lung cancer screening. You may have this screening every year starting at age 78 if you have a 30-pack-year history of smoking and currently smoke or have quit within the past 15 years. Fecal occult blood test (FOBT) of the stool. You may have this test every year starting at age 39. Flexible sigmoidoscopy or colonoscopy. You may have a sigmoidoscopy every 5 years or a colonoscopy every 10 years starting at age 63. Hepatitis C blood test. Hepatitis B blood test. Sexually transmitted disease (STD) testing. Diabetes screening. This is done by checking your blood sugar (glucose) after you have not eaten for a while (fasting). You may have this done every 1-3 years. Bone density scan. This is done to screen for osteoporosis. You may have this done starting at age 28. Mammogram. This may be done every 1-2 years. Talk to your health care provider about how often you should have regular mammograms. Talk with your health care provider about your test results, treatment options, and if necessary, the need for more tests. Vaccines  Your health care provider may recommend certain vaccines, such as: Influenza vaccine. This is recommended every year. Tetanus, diphtheria, and acellular pertussis (Tdap, Td) vaccine. You may need a Td booster every 10 years. Zoster vaccine. You may need this after age 23. Pneumococcal 13-valent conjugate (PCV13) vaccine. One dose  is recommended after age 70. Pneumococcal  polysaccharide (PPSV23) vaccine. One dose is recommended after age 75. Talk to your health care provider about which screenings and vaccines you need and how often you need them. This information is not intended to replace advice given to you by your health care provider. Make sure you discuss any questions you have with your health care provider. Document Released: 11/05/2015 Document Revised: 06/28/2016 Document Reviewed: 08/10/2015 Elsevier Interactive Patient Education  2017 [place] Prevention in the Home Falls can cause injuries. They can happen to people of all ages. There are many things you can do to make your home safe and to help prevent falls. What can I do on the outside of my home? Regularly fix the edges of walkways and driveways and fix any cracks. Remove anything that might make you trip as you walk through a door, such as a raised step or threshold. Trim any bushes or trees on the path to your home. Use bright outdoor lighting. Clear any walking paths of anything that might make someone trip, such as rocks or tools. Regularly check to see if handrails are loose or broken. Make sure that both sides of any steps have handrails. Any raised decks and porches should have guardrails on the edges. Have any leaves, snow, or ice cleared regularly. Use sand or salt on walking paths during winter. Clean up any spills in your garage right away. This includes oil or grease spills. What can I do in the bathroom? Use night lights. Install grab bars by the toilet and in the tub and shower. Do not use towel bars as grab bars. Use non-skid mats or decals in the tub or shower. If you need to sit down in the shower, use a plastic, non-slip stool. Keep the floor dry. Clean up any water that spills on the floor as soon as it happens. Remove soap buildup in the tub or shower regularly. Attach bath mats securely with double-sided non-slip rug tape. Do not have throw rugs and other  things on the floor that can make you trip. What can I do in the bedroom? Use night lights. Make sure that you have a light by your bed that is easy to reach. Do not use any sheets or blankets that are too big for your bed. They should not hang down onto the floor. Have a firm chair that has side arms. You can use this for support while you get dressed. Do not have throw rugs and other things on the floor that can make you trip. What can I do in the kitchen? Clean up any spills right away. Avoid walking on wet floors. Keep items that you use a lot in easy-to-reach places. If you need to reach something above you, use a strong step stool that has a grab bar. Keep electrical cords out of the way. Do not use floor polish or wax that makes floors slippery. If you must use wax, use non-skid floor wax. Do not have throw rugs and other things on the floor that can make you trip. What can I do with my stairs? Do not leave any items on the stairs. Make sure that there are handrails on both sides of the stairs and use them. Fix handrails that are broken or loose. Make sure that handrails are as long as the stairways. Check any carpeting to make sure that it is firmly attached to the stairs. Fix any carpet that is loose or worn. Avoid  having throw rugs at the top or bottom of the stairs. If you do have throw rugs, attach them to the floor with carpet tape. Make sure that you have a light switch at the top of the stairs and the bottom of the stairs. If you do not have them, ask someone to add them for you. What else can I do to help prevent falls? Wear shoes that: Do not have high heels. Have rubber bottoms. Are comfortable and fit you well. Are closed at the toe. Do not wear sandals. If you use a stepladder: Make sure that it is fully opened. Do not climb a closed stepladder. Make sure that both sides of the stepladder are locked into place. Ask someone to hold it for you, if possible. Clearly  mark and make sure that you can see: Any grab bars or handrails. First and last steps. Where the edge of each step is. Use tools that help you move around (mobility aids) if they are needed. These include: Canes. Walkers. Scooters. Crutches. Turn on the lights when you go into a dark area. Replace any light bulbs as soon as they burn out. Set up your furniture so you have a clear path. Avoid moving your furniture around. If any of your floors are uneven, fix them. If there are any pets around you, be aware of where they are. Review your medicines with your doctor. Some medicines can make you feel dizzy. This can increase your chance of falling. Ask your doctor what other things that you can do to help prevent falls. This information is not intended to replace advice given to you by your health care provider. Make sure you discuss any questions you have with your health care provider. Document Released: 08/05/2009 Document Revised: 03/16/2016 Document Reviewed: 11/13/2014 Elsevier Interactive Patient Education  2017 [employer].

## 2022-04-03 NOTE — Progress Notes (Signed)
Subjective:   [name] is a 87 y.o. female who presents for Medicare Annual (Subsequent) preventive examination.  I connected with Rosio today by telephone and verified that I am speaking with the correct person using two identifiers. Location patient: home Location provider: work Persons participating in the virtual visit: patient, Marine scientist.    I discussed the limitations, risks, security and privacy concerns of performing an evaluation and management service by telephone and the availability of in person appointments. I also discussed with the patient that there may be a patient responsible charge related to this service. The patient expressed understanding and verbally consented to this telephonic visit.    Interactive audio and video telecommunications were attempted between this provider and patient, however failed, due to patient having technical difficulties OR patient did not have access to video capability.  We continued and completed visit with audio only.  Some vital signs may be absent or patient reported.   Time Spent with patient on telephone encounter: 25 minutes   Review of Systems     Cardiac Risk Factors include: advanced age (>47mn, >>60women);diabetes mellitus;dyslipidemia;hypertension     Objective:    Today's Vitals   04/03/22 1331  Weight: 161 lb (73 kg)  Height: '5\' 4"'$  (1.626 m)   Body mass index is 27.64 kg/m.     04/03/2022    1:35 PM 09/02/2021    4:27 PM 06/01/2021    8:49 PM 02/24/2021    3:39 PM 11/25/2020   11:31 AM 07/18/2020    6:19 AM 01/30/2020    2:35 PM  Advanced Directives  Does Patient Have a Medical Advance Directive? No No No No No No No  Would patient like information on creating a medical advance directive?  No - Patient declined No - Patient declined Yes (MAU/Ambulatory/Procedural Areas - Information given)  No - Patient declined No - Patient declined    Current Medications (verified) Outpatient Encounter Medications as of  04/03/2022  Medication Sig   Alpha-Lipoic Acid 600 MG CAPS Take 1,200 mg by mouth daily.   amitriptyline (ELAVIL) 25 MG tablet Take 1 tablet (25 mg total) by mouth at bedtime.   Ascorbic Acid (VITAMIN C WITH ROSE HIPS) 500 MG tablet Take 500 mg by mouth in the morning and at bedtime.   atorvastatin (LIPITOR) 40 MG tablet Take 40 mg by mouth daily.   carvedilol (COREG) 12.5 MG tablet Take 12.5 mg by mouth 2 (two) times daily with a meal.   Cholecalciferol (VITAMIN D3) 5000 UNITS CAPS Take 5,000 Units by mouth every evening. Reported on 11/24/2015   CINNAMON PO Take 2,000 mg by mouth daily.   clopidogrel (PLAVIX) 75 MG tablet Take 1 tablet (75 mg total) by mouth at bedtime. Restart on 12/04/2020   Cyanocobalamin (VITAMIN B-12) 5000 MCG SUBL Place 5,000 mcg under the tongue daily.   dextromethorphan-guaiFENesin (TUSSIN DM) 10-100 MG/5ML liquid Take by mouth every 4 (four) hours as needed for cough.   diclofenac Sodium (VOLTAREN) 1 % GEL Apply 4 g topically 4 (four) times daily. (Patient taking differently: Apply 4 g topically 4 (four) times daily. If needed for pain)   dicyclomine (BENTYL) 10 MG capsule TAKE 1 CAPSULE THREE TIMES A DAY BEFORE MEALS (NEED APPOINTMENT)   donepezil (ARICEPT) 10 MG tablet Take 1 tablet (10 mg total) by mouth at bedtime.   famotidine (PEPCID) 20 MG tablet Take 1 tablet (20 mg total) by mouth 2 (two) times daily.   ferrous sulfate (FEROSUL) 325 (65  FE) MG tablet Take 1 tablet (325 mg total) by mouth 2 (two) times daily with a meal.   furosemide (LASIX) 20 MG tablet Take 20 mg by mouth daily.   Galcanezumab-gnlm (EMGALITY) 120 MG/ML SOAJ Inject 120 mg into the skin every 30 (thirty) days.   glucose blood (FREESTYLE LITE) test strip USE TO CHECK BLOOD SUGAR NO MORE THAN TWICE DAILY   HYDROcodone-acetaminophen (NORCO) 10-325 MG tablet Take 1 tablet by mouth every 6 (six) hours as needed.   hydrocortisone (ANUSOL-HC) 2.5 % rectal cream Place 1 application rectally 2 (two) times  daily. As needed   hydrocortisone (ANUSOL-HC) 25 MG suppository Place 1 suppository (25 mg total) rectally 2 (two) times daily. As needed   ketoconazole (NIZORAL) 2 % cream Apply 1 application. topically daily.   Lancets (FREESTYLE) lancets CHECK BLOOD SUGAR TWICE DAILY   linaclotide (LINZESS) 72 MCG capsule Take 1 capsule (72 mcg total) by mouth daily before breakfast.   loperamide (IMODIUM A-D) 2 MG tablet Take 2 mg by mouth as needed for diarrhea or loose stools.   MegaRed Omega-3 Krill Oil 500 MG CAPS Restart on 12/04/2020   methenamine (HIPREX) 1 g tablet Take 0.5 tablets by mouth daily.   Multiple Vitamins-Minerals (PRESERVISION AREDS 2) CAPS Take 1 capsule by mouth 2 (two) times daily.   naloxone (NARCAN) nasal spray 4 mg/0.1 mL Place 1 spray into the nose once as needed for up to 1 dose.   nitroGLYCERIN (NITROSTAT) 0.4 MG SL tablet Place 1 tablet (0.4 mg total) under the tongue every 5 (five) minutes x 3 doses as needed for chest pain.   omeprazole (PRILOSEC) 20 MG capsule Take 1 capsule (20 mg total) by mouth 2 (two) times daily before a meal.   ondansetron (ZOFRAN) 4 MG tablet TAKE 1 TABLET(4 MG) BY MOUTH EVERY 8 HOURS AS NEEDED FOR NAUSEA OR VOMITING   OVER THE COUNTER MEDICATION 1 tablet 3 (three) times daily. Berberorine Gluco Defense   Polyvinyl Alcohol-Povidone (REFRESH OP) Place 1 drop into both eyes 3 (three) times daily as needed (dry eyes).    pramipexole (MIRAPEX) 0.125 MG tablet TAKE 1 TO 2 TABLETS(0.125 TO 0.25 MG) BY MOUTH AT BEDTIME AS NEEDED   Probiotic Product (PROBIOTIC PEARLS PO) Take 1 tablet by mouth daily.   rivaroxaban (XARELTO) 2.5 MG TABS tablet Take 1 tablet (2.5 mg total) by mouth 2 (two) times daily. Restart on 12/04/2020   silver sulfADIAZINE (SILVADENE) 1 % cream Apply 1 application topically daily.   vitamin E 180 MG (400 UNITS) capsule Take 400 Units by mouth daily.   No facility-administered encounter medications on file as of 04/03/2022.    Allergies  (verified) Cymbalta [duloxetine hcl], Gabapentin, Silicone, Metformin and related, Tramadol, Adhesive [tape], Cefazolin, Ciprofloxacin, Clorazepate dipotassium, Dilaudid [hydromorphone hcl], Doxycycline, Enablex [darifenacin hydrobromide er], Levofloxacin, Lyrica [pregabalin], Methadone hcl, Morphine and related, and Talwin [pentazocine]   History: Past Medical History:  Diagnosis Date   Allergic rhinitis    Arthritis    "back; ankles; hands; knees" (10/31/2013)   Bilateral sensorineural hearing loss    CAD (coronary artery disease)    a. Nonobst in 2011. b. Abnormal nuc 09/2013 -> s/p cutting balloon to D2, mild LAD disease; c. 08/2015 MV: no ischemia, EF 71%.   Carcinoma in situ in a polyp 1994   a. 1994 - malignant polyp removed during colonoscopy.   Chronic diastolic CHF (congestive heart failure) ([place])    a. 09/2013 Echo: EF 60-65%, mild LVH, Gr1 DD.  Dementia ([place])    Diverticulosis    DJD (degenerative joint disease)    Fatty liver    GERD (gastroesophageal reflux disease)    a. Hx GERD/esophageal dysmotility followed by Dr. Olevia Perches.    Hemorrhoids    Hiatal hernia    a. s/p Nissen fundoplication 3094.   Hyperlipidemia    a. patient unwilling to use statins.   Hypertensive heart disease    IBS (irritable bowel syndrome)    Macular degeneration 03/2009   Dr. Rosana Hoes   Neuropathy    a. Hands, feet, legs.   Orthostatic hypotension    Osteomyelitis ([facility])    a. 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 01/2016 s/p RLE transtibial amputation 2/2 Charcot rocker-bottom deformity and insensate neuropathy ulceration.   Osteoporosis    PE (pulmonary embolism) 12/2007   a. PE/DVT after neck surgery 2009. b. coumadin d/c 10-2008.   PONV (postoperative nausea and vomiting) 2009   neck surgery   PPD positive    Recurrent UTI    RSD (reflex sympathetic dystrophy)    a. Chronic pain.   Spinal stenosis    Type II diabetes mellitus  ([place])    no on medication   Venous insufficiency    a. Contributing to LEE.   Past Surgical History:  Procedure Laterality Date   AMPUTATION  03/06/2012   Procedure: AMPUTATION FOOT;  Surgeon: Newt Minion, MD;  Location: [facility];  Service: Orthopedics;  Laterality: Left;  FIFTH RAY AMPUTATION    AMPUTATION Right 02/18/2016   Procedure: AMPUTATION BELOW KNEE;  Surgeon: Newt Minion, MD;  Location: [facility];  Service: Orthopedics;  Laterality: Right;   AMPUTATION Left 11/22/2016   Procedure: Amputation 4th Toe Left Foot at Metatarsophalangeal Joint;  Surgeon: Newt Minion, MD;  Location: [facility];  Service: Orthopedics;  Laterality: Left;   ANKLE FUSION  09/27/2012   Procedure: ANKLE FUSION;  Surgeon: Newt Minion, MD;  Location: [facility];  Service: Orthopedics;  Laterality: Left;  Left Tibiocalcaneal Fusion   ANKLE FUSION Right 05/09/2013   Procedure: ANKLE FUSION;  Surgeon: Newt Minion, MD;  Location: [facility];  Service: Orthopedics;  Laterality: Right;  Excision Osteomyelitis Base 1st MT Right Foot, Fusion Medial Column   ANTERIOR CERVICAL DECOMP/DISCECTOMY FUSION  12/18/07   For OA,  Dr. Lorin Mercy:  fu by a PE   ANTERIOR CERVICAL DECOMP/DISCECTOMY FUSION N/A 11/29/2020   Procedure: Anterior Cervical Decompression Fusion - Cervical Three-Cerivcal Four;  Surgeon: Earnie Larsson, MD;  Location: [place] [facility];  Service: Neurosurgery;  Laterality: N/A;  3C   CARDIAC CATHETERIZATION  05/2010    at [facility] Left 09/2011   CATARACT EXTRACTION W/ INTRAOCULAR LENS  IMPLANT, BILATERAL  2004   feb 2004 left, aug 2004 right   CHOLECYSTECTOMY  03/2002   CORONARY ANGIOPLASTY  10/31/2013   CORONARY ARTERY BYPASS GRAFT  09/27/2016   CYST REMOVAL HAND  06/2003   FOOT BONE EXCISION Right 06/2009   LAPAROSCOPIC RIGHT HEMI COLECTOMY N/A 01/08/2015   Procedure: LAPAROSCOPIC ASSISTED RIGHT HEMI COLECTOMY;  Surgeon: Johnathan Hausen, MD;  Location: [facility];  Service: General;  Laterality: N/A;   LEFT HEART  CATHETERIZATION WITH CORONARY ANGIOGRAM N/A 10/31/2013   Procedure: LEFT HEART CATHETERIZATION WITH CORONARY ANGIOGRAM;  Surgeon: Jettie Booze, MD;  Location: [facility] CATH LAB;  Service: Cardiovascular;  Laterality: N/A;   NASAL SEPTUM SURGERY  04/6807   NISSEN FUNDOPLICATION  01/25/2010   PERCUTANEOUS CORONARY INTERVENTION-BALLOON ONLY  10/31/2013   Procedure: PERCUTANEOUS CORONARY INTERVENTION-BALLOON ONLY;  Surgeon: Jettie Booze, MD;  Location[facility] C[facility];  Service: Cardiovascular;;   ROTATOR CUFF REPAIR Right 07/2007   Dr. Morene Rankins CUFF REPAIR Left 11/2004   stent in left leg, behind knee  06/27/2017   x2, done at [facility] in [facility]   TOE AMPUTATION Right Sep 07, 2008   3rd toe, Dr. Sharol Given due to osteomyelitis   VAGINAL HYSTERECTOMY  03/1975   VEIN LIGATION Bilateral 03/1966   VENA CAVA FILTER PLACEMENT  01/2010   green filter; "due to blood clots"   WISDOM TOOTH EXTRACTION  06/2007   Family History  Problem Relation Age of Onset   Heart disease Mother        mitral valve replaced   Arthritis Mother    Diabetic kidney disease Daughter    Diabetes Father    Stroke Father    Migraines Sister    Hypertension Other    Breast cancer Other    Colon cancer Neg Hx    Esophageal cancer Neg Hx    Stomach cancer Neg Hx    Rectal cancer Neg Hx    Social History   Socioeconomic History   Marital status: Divorced    Spouse name: Not on file   Number of children: 3   Years of education: 15   Highest education level: Not on file  Occupational History   Occupation: Retired     Comment: from social services   Tobacco Use   Smoking status: Never   Smokeless tobacco: Never   Tobacco comments:    tried for a few months in college.   Vaping Use   Vaping Use: Never used  Substance and Sexual Activity   Alcohol use: No    Alcohol/week: 0.0 standard drinks of alcohol   Drug use: No   Sexual activity: Not Currently    Birth control/protection: Post-menopausal   Other Topics Concern   Not on file  Social History Narrative   Daughter [name] deceased Sep 07, 2020   daughter lives in town   1 son in [place], Clovia Cuff comes home every 4 weeks to visit   Caffeine use:  Tea 1/day w/ dinner   Coffee occass          Currently lives in [place] with son Khaleah Duer.   Social Determinants of Health   Financial Resource Strain: Low Risk  (04/03/2022)   Overall Financial Resource Strain (CARDIA)    Difficulty of Paying Living Expenses: Not hard at all  Food Insecurity: No Food Insecurity (04/03/2022)   Hunger Vital Sign    Worried About Running Out of Food in the Last Year: Never true    Ran Out of Food in the Last Year: Never true  Transportation Needs: No Transportation Needs (11/21/2021)   PRAPARE - Hydrologist (Medical): No    Lack of Transportation (Non-Medical): No  Physical Activity: Inactive (02/24/2021)   Exercise Vital Sign    Days of Exercise per Week: 0 days    Minutes of Exercise per Session: 0 min  Stress: No Stress Concern Present (04/03/2022)   [place]    Feeling of Stress : Not at all  Social Connections: Socially Isolated (04/03/2022)   Social Connection and Isolation Panel [NHANES]    Frequency of Communication with Friends and Family: Twice a week  Frequency of Social Gatherings with Friends and Family: Once a week    Attends Religious Services: Never    Marine scientist or Organizations: No    Attends Music therapist: Never    Marital Status: Divorced    Tobacco Counseling Counseling given: Not Answered Tobacco comments: tried for a few months in college.    Clinical Intake:  Pre-visit preparation completed: Yes  Pain : No/denies pain     BMI - recorded: 27.64 Nutritional Status: BMI 25 -29 Overweight Nutritional Risks: None Diabetes: Yes CBG done?: No Did pt. bring in CBG monitor from home?:  No  How often do you need to have someone help you when you read instructions, pamphlets, or other written materials from your doctor or pharmacy?: 1 - Never  Diabetes:  Is the patient diabetic?  Yes  If diabetic, was a CBG obtained today?  No  Did the patient bring in their glucometer from home?  No phone visit How often do you monitor your CBG's? never.   Financial Strains and Diabetes Management:  Are you having any financial strains with the device, your supplies or your medication? No .  Does the patient want to be seen by Chronic Care Management for management of their diabetes?  No  Would the patient like to be referred to a Nutritionist or for Diabetic Management?  No   Diabetic Exams:  Diabetic Eye Exam: Completed 09/28/2021.   Diabetic Foot Exam: To be completed by PCP   Interpreter Needed?: No  Information entered by :: Caroleen Hamman LPN   Activities of Daily Living    04/03/2022    1:39 PM  In your present state of health, do you have any difficulty performing the following activities:  Hearing? 0  Vision? 0  Difficulty concentrating or making decisions? 1  Walking or climbing stairs? 1  Comment stairs  Dressing or bathing? 0  Doing errands, shopping? 1  Preparing Food and eating ? Y  Using the Toilet? N  In the past six months, have you accidently leaked urine? N  Do you have problems with loss of bowel control? Y  Managing your Medications? N  Managing your Finances? N  Housekeeping or managing your Housekeeping? Y    Patient Care Team: Colon Branch, MD as PCP - General Melvenia Beam, MD as Consulting Physician (Neurology) Newt Minion, MD as Consulting Physician (Orthopedic Surgery) Elmore Guise, MD as Consulting Physician (Vascular Surgery) Janyth Contes, MD as Consulting Physician (Cardiology) Festus Aloe, MD as Consulting Physician (Urology) Katy Apo, MD as Consulting Physician (Ophthalmology)  Indicate any recent Medical  Services you may have received from other than [facility] providers in the past year (date may be approximate).     Assessment:   This is a routine wellness examination for Dasja.  Hearing/Vision screen Hearing Screening - Comments:: No issues Vision Screening - Comments:: Last eye exam-03/2022-Dr. Lyles  Dietary issues and exercise activities discussed: Current Exercise Habits: The patient does not participate in regular exercise at present   Goals Addressed             This Visit's Progress    Increase mobility   Not on track    Start walking again.       Depression Screen    04/03/2022    1:39 PM 02/27/2022    1:52 PM 09/02/2021    3:07 PM 07/27/2021    2:50 PM 03/16/2021    2:07 PM 02/24/2021  3:47 PM 01/26/2021    3:19 PM  PHQ 2/9 Scores  PHQ - 2 Score 0 0 0 0 3 0 0  PHQ- 9 Score  0  0   0    Fall Risk    04/03/2022    1:37 PM 02/27/2022    2:03 PM 09/02/2021    3:06 PM 07/27/2021    2:42 PM 03/16/2021    2:08 PM  Fall Risk   Falls in the past year? 1 0 '1 1 1  '$ Number falls in past yr: 1 0 0 1 1  Injury with Fall? 0 0 1 0 0  Risk for fall due to : History of fall(s)  History of fall(s);Impaired balance/gait    Follow up Falls prevention discussed Falls evaluation completed Falls evaluation completed Falls evaluation completed Falls evaluation completed    FALL RISK PREVENTION PERTAINING TO THE HOME:  Any stairs in or around the home? Yes  If so, are there any without handrails? No  Home free of loose throw rugs in walkways, pet beds, electrical cords, etc? Yes  Adequate lighting in your home to reduce risk of falls? Yes   ASSISTIVE DEVICES UTILIZED TO PREVENT FALLS:  Life alert? No  Use of a cane, walker or w/c? Yes wheelchair Grab bars in the bathroom? Yes  Shower chair or bench in shower? Yes  Elevated toilet seat or a handicapped toilet? No   TIMED UP AND GO:  Was the test performed? No . Phone visit   Cognitive Function:Normal cognitive status assessed  by this Nurse Health Advisor. No abnormalities found.      10/28/2021    3:43 PM 12/14/2016   12:27 PM  MMSE - Mini Mental State Exam  Orientation to time 4 5  Orientation to Place 5 5  Registration 3 3  Attention/ Calculation 2 5  Recall 1 2  Language- name 2 objects 1 2  Language- repeat 1 1  Language- follow 3 step command 3 3  Language- read & follow direction 1 1  Write a sentence 1 1  Copy design 1 1  Total score 23 29        02/24/2021    4:02 PM  6CIT Screen  What Year? 0 points  What month? 3 points  What time? 0 points  Count back from 20 0 points  Months in reverse 2 points  Repeat phrase 8 points  Total Score 13 points    Immunizations Immunization History  Administered Date(s) Administered   Fluad Quad(high Dose 65+) 07/11/2019, 09/22/2020, 07/27/2021   Influenza Split 08/07/2012   Influenza Whole 07/22/2008, 07/26/2009   Influenza, High Dose Seasonal PF 10/15/2015, 09/18/2016, 07/23/2017, 08/21/2018   Influenza, Seasonal, Injecte, Preservative Fre 07/23/2013   Influenza,inj,Quad PF,6+ Mos 10/06/2014   PFIZER(Purple Top)SARS-COV-2 Vaccination 12/27/2019, 01/31/2020, 11/01/2020   Pneumococcal Conjugate-13 10/06/2014   Pneumococcal Polysaccharide-23 09/28/2010   Tetanus 09/17/2013   Zoster Recombinat (Shingrix) 10/11/2018, 12/12/2018   Zoster, Live 09/18/2011    TDAP status: Up to date  Flu Vaccine status: Up to date  Pneumococcal vaccine status: Up to date  Covid-19 vaccine status: Information provided on how to obtain vaccines.   Qualifies for Shingles Vaccine? No   Zostavax completed Yes   Shingrix Completed?: Yes  Screening Tests Health Maintenance  Topic Date Due   COVID-19 Vaccine (4 - Booster for Pfizer series) 12/27/2020   INFLUENZA VACCINE  05/23/2022   HEMOGLOBIN A1C  08/30/2022   OPHTHALMOLOGY EXAM  09/28/2022   TETANUS/TDAP  09/18/2023   MAMMOGRAM  03/14/2024   Pneumonia Vaccine 31+ Years old  Completed   DEXA SCAN  Completed    Zoster Vaccines- Shingrix  Completed   HPV VACCINES  Aged Out   COLONOSCOPY (Pts 45-28yr Insurance coverage will need to be confirmed)  DEustaceMaintenance Due  Topic Date Due   COVID-19 Vaccine (4 - Booster for PSkamaniaseries) 12/27/2020    Colorectal cancer screening: No longer required.   Mammogram status: Completed bilateral 03/14/2022. Repeat every year  Bone Density status: Completed 03/08/2021. Results reflect: Bone density results: OSTEOPOROSIS. Repeat every 2 years.  Lung Cancer Screening: (Low Dose CT Chest recommended if Age 85-80years, 30 pack-year currently smoking OR have quit w/in 15years.) does not qualify.     Additional Screening:  Hepatitis C Screening: does not qualify  Vision Screening: Recommended annual ophthalmology exams for early detection of glaucoma and other disorders of the eye. Is the patient up to date with their annual eye exam?  Yes  Who is the provider or what is the name of the office in which the patient attends annual eye exams? Dr. LPrudencio Burly  Dental Screening: Recommended annual dental exams for proper oral hygiene  Community Resource Referral / Chronic Care Management: CRR required this visit?  No   CCM required this visit?  No      Plan:     I have personally reviewed and noted the following in the patient's chart:   Medical and social history Use of alcohol, tobacco or illicit drugs  Current medications and supplements including opioid prescriptions.  Functional ability and status Nutritional status Physical activity Advanced directives List of other physicians Hospitalizations, surgeries, and ER visits in previous 12 months Vitals Screenings to include cognitive, depression, and falls Referrals and appointments  In addition, I have reviewed and discussed with patient certain preventive protocols, quality metrics, and best practice recommendations. A written personalized care plan for  preventive services as well as general preventive health recommendations were provided to patient.   Due to this being a telephonic visit, the after visit summary with patients personalized plan was offered to patient via mail or my-chart. Patient would like to access on my-chart.   MMarta Antu LPN   68/34/1960 Nurse Health Advisor  Nurse Notes: None

## 2022-04-04 ENCOUNTER — Encounter: Payer: Self-pay | Admitting: Internal Medicine

## 2022-04-04 ENCOUNTER — Other Ambulatory Visit: Payer: Self-pay

## 2022-04-04 DIAGNOSIS — D649 Anemia, unspecified: Secondary | ICD-10-CM

## 2022-04-04 DIAGNOSIS — D696 Thrombocytopenia, unspecified: Secondary | ICD-10-CM

## 2022-04-10 ENCOUNTER — Ambulatory Visit (INDEPENDENT_AMBULATORY_CARE_PROVIDER_SITE_OTHER): Payer: Medicare Other

## 2022-04-10 ENCOUNTER — Encounter: Payer: Self-pay | Admitting: Orthopedic Surgery

## 2022-04-10 ENCOUNTER — Ambulatory Visit (INDEPENDENT_AMBULATORY_CARE_PROVIDER_SITE_OTHER): Payer: Medicare Other | Admitting: Orthopedic Surgery

## 2022-04-10 ENCOUNTER — Other Ambulatory Visit: Payer: Self-pay | Admitting: Family

## 2022-04-10 DIAGNOSIS — Z89511 Acquired absence of right leg below knee: Secondary | ICD-10-CM

## 2022-04-10 DIAGNOSIS — M25511 Pain in right shoulder: Secondary | ICD-10-CM

## 2022-04-10 DIAGNOSIS — I2510 Atherosclerotic heart disease of native coronary artery without angina pectoris: Secondary | ICD-10-CM

## 2022-04-10 DIAGNOSIS — M542 Cervicalgia: Secondary | ICD-10-CM

## 2022-04-10 DIAGNOSIS — G8929 Other chronic pain: Secondary | ICD-10-CM

## 2022-04-10 DIAGNOSIS — S88111A Complete traumatic amputation at level between knee and ankle, right lower leg, initial encounter: Secondary | ICD-10-CM

## 2022-04-10 DIAGNOSIS — D649 Anemia, unspecified: Secondary | ICD-10-CM

## 2022-04-10 MED ORDER — PREDNISONE 10 MG PO TABS: 10.0000 mg | ORAL_TABLET | Freq: Every day | ORAL | 0 refills | Status: DC

## 2022-04-10 NOTE — Progress Notes (Signed)
Office Visit Note   Patient: [name]           Date of Birth: 12-17-1934           MRN: [id number] Visit Date: 04/10/2022              Requested by: Colon Branch, [address] [place],  [place] [address] PCP: Colon Branch, MD  Chief Complaint  Patient presents with   Right Shoulder - Follow-up   Right Leg - Follow-up    HX BKA    Neck - Pain      HPI: Patient is an 87 year old woman who is seen for several medical conditions.  She has right-sided neck pain which she is status post fusion surgery with Dr. Trenton Gammon most recently 2 years ago.  She denies any numbness or tingling into her hands.  Patient complains of right shoulder pain right elbow pain and right wrist pain.  Patient also states that she has had loose fitting of her prosthesis and will need a new liner.  Assessment & Plan: Visit Diagnoses:  1. Neck pain   2. Chronic right shoulder pain   3. Below-knee amputation of right lower extremity ([place])     Plan: Patient is provided a prescription for a new socket liner materials and supplies.  We will start her on low-dose prednisone to see if this can help with her neck shoulder elbow and wrist pain.  Follow-Up Instructions: Return if symptoms worsen or fail to improve.   Ortho Exam  Patient is alert, oriented, no adenopathy, well-dressed, normal affect, normal respiratory effort. Examination patient has pain with range of motion of the right shoulder she also has pain to palpation around the right elbow and right wrist.  There are no radicular symptoms no focal motor weakness.  Patient has pain in the cervical spine without radicular symptoms.  Patient has a loose fitting prosthesis despite wearing a under liner and over liner.  Patient is an existing right transtibial  amputee.  Patient's current comorbidities are not expected to impact the ability to function with the prescribed prosthesis. Patient verbally communicates a strong desire to use a  prosthesis. Patient currently requires mobility aids to ambulate without a prosthesis.  Expects not to use mobility aids with a new prosthesis.  Patient is a K2 level ambulator that will use a prosthesis to walk around their home and the community over low level environmental barriers.      Imaging: XR Shoulder Right  Result Date: 04/10/2022 2 view radiographs of the right shoulder show superior migration of the humeral head within the glenohumeral joint consistent with chronic rotator cuff pathology.  XR Cervical Spine 2 or 3 views  Result Date: 04/10/2022 2 view radiographs of the cervical spine shows hardware for most recent fusion C3-4 with 2 level fusion below this.  No hardware failure.  No images are attached to the encounter.  Labs: Lab Results  Component Value Date   HGBA1C 5.1 02/27/2022   HGBA1C 5.6 03/18/2021   HGBA1C 6.1 (H) 12/17/2020   ESRSEDRATE 9 06/12/2014   ESRSEDRATE 12 05/13/2014   REPTSTATUS 06/04/2021 FINAL 06/02/2021   CULT >=100,000 COLONIES/mL ESCHERICHIA COLI (A) 06/02/2021   LABORGA ESCHERICHIA COLI (A) 06/02/2021     Lab Results  Component Value Date   ALBUMIN 3.7 02/27/2022   ALBUMIN 3.5 06/01/2021   ALBUMIN 3.8 09/03/2020    Lab Results  Component Value Date   MG 1.5  03/30/2016   Lab Results  Component Value Date   VD25OH 65 10/20/2011    No results found for: "PREALBUMIN"    Latest Ref Rng & Units 03/27/2022    3:29 PM 02/27/2022    2:52 PM 09/02/2021    5:23 PM  CBC EXTENDED  WBC 4.0 - 10.5 K/uL 3.3  4.0  4.3   RBC 3.87 - 5.11 Mil/uL 3.32  3.48  3.62   Hemoglobin 12.0 - 15.0 g/dL 10.1  10.7  11.3   HCT 36.0 - 46.0 % 31.0  32.6  35.2   Platelets 150.0 - 400.0 K/uL 119.0  132.0  131   NEUT# 1.4 - 7.7 K/uL 1.5  2.3  2.8   Lymph# 0.7 - 4.0 K/uL 1.4  1.2  1.2      There is no height or weight on file to calculate BMI.  Orders:  Orders Placed This Encounter  Procedures   XR Shoulder Right   XR Cervical Spine 2 or 3 views    Meds ordered this encounter  Medications   predniSONE (DELTASONE) 10 MG tablet    Sig: Take 1 tablet (10 mg total) by mouth daily with breakfast.    Dispense:  30 tablet    Refill:  0     Procedures: No procedures performed  Clinical Data: No additional findings.  ROS:  All other systems negative, except as noted in the HPI. Review of Systems  Objective: Vital Signs: There were no vitals taken for this visit.  Specialty Comments:  No specialty comments available.  PMFS History: Patient Active Problem List   Diagnosis Date Noted   Dementia ([place]) 10/30/2021   Chronic migraine without aura, with intractable migraine, so stated, with status migrainosus 08/17/2021   Cervical myelopathy ([place]) 11/29/2020   Chronic idiopathic constipation 11/12/2020   Lumbar compression fracture ([place]) 09/29/2020   Other spondylosis with radiculopathy, cervical region 09/29/2020   External hemorrhoids 02/15/2018   History of three vessel coronary artery bypass - 2017 , in [facility] 09/25/2017   PVD (peripheral vascular disease) ([place])-- stent L leg 06-2017 Dr Feliberto [facility] 07/23/2017   Phantom pain ([place]) 04/01/2017   Constipation, chronic 04/01/2017   S/P unilateral BKA (below knee amputation), right ([place]) 12/14/2016   Toe osteomyelitis, left ([place]) 11/17/2016   Acute on chronic diastolic CHF (congestive heart failure), NYHA class 3 ([place]) 03/30/2016   Coronary artery disease due to lipid rich plaque 03/30/2016   Acute renal failure superimposed on stage 3 chronic kidney disease ([place]) 03/30/2016   S/P below knee amputation ([place]) 02/18/2016   Memory loss 11/26/2015   PCP NOTES >>>>>>>>>>>>>>>>>>>> 10/18/2015   S/P partial colectomy 01/08/2015   Diarrhea 01/29/2014   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 01/28/2014   Anemia 01/18/2014   CAD (coronary artery disease)    Carcinoma in situ in a polyp    Depression 09/17/2013   Charcot's joint 08/21/2013   Foot osteomyelitis, right  ([place]) 07/16/2013   Chronic pain associated with significant psychosocial dysfunction 05/06/2013   Neuropathy    Hyperthyroidism    Lower extremity edema 03/08/2011   Annual physical exam 03/08/2011   Tachycardia 12/07/2010   ALLERGIC RHINITIS 04/15/2010   ORTHOSTATIC DIZZINESS 04/13/2010   HIATAL HERNIA 12/06/2009   GASTRIC ULCER, HX OF 12/06/2009   DYSPNEA 03/18/2009   DEGENERATIVE JOINT DISEASE 06/03/2008   COLONIC POLYPS 01/27/2008   DIVERTICULOSIS, COLON 01/27/2008   IRRITABLE BOWEL SYNDROME, HX OF 01/27/2008   Pulmonary nodule, right 01/22/2008   Hyperlipidemia  12/30/2007   Osteoporosis 12/30/2007   TB SKIN TEST, POSITIVE 08/09/2007   DM type 2 with diabetic peripheral neuropathy ([place]) 05/09/2007   Reflex sympathetic dystrophy-- PAIN MNGMT  05/09/2007   Essential hypertension 04/15/2007   GERD 04/15/2007   Past Medical History:  Diagnosis Date   Allergic rhinitis    Arthritis    "back; ankles; hands; knees" (10/31/2013)   Bilateral sensorineural hearing loss    CAD (coronary artery disease)    a. Nonobst in 2011. b. Abnormal nuc 09/2013 -> s/p cutting balloon to D2, mild LAD disease; c. 08/2015 MV: no ischemia, EF 71%.   Carcinoma in situ in a polyp 1994   a. 1994 - malignant polyp removed during colonoscopy.   Chronic diastolic CHF (congestive heart failure) ([place])    a. 09/2013 Echo: EF 60-65%, mild LVH, Gr1 DD.   Dementia ([place])    Diverticulosis    DJD (degenerative joint disease)    Fatty liver    GERD (gastroesophageal reflux disease)    a. Hx GERD/esophageal dysmotility followed by Dr. Olevia Perches.    Hemorrhoids    Hiatal hernia    a. s/p Nissen fundoplication 4627.   Hyperlipidemia    a. patient unwilling to use statins.   Hypertensive heart disease    IBS (irritable bowel syndrome)    Macular degeneration 03/2009   Dr. Rosana Hoes   Neuropathy    a. Hands, feet, legs.   Orthostatic hypotension    Osteomyelitis ([facility])    a. Adm 04/2013: Charcot collapse of the  right foot with osteomyelitis and ulceration, s/p excision; b. 01/2016 s/p RLE transtibial amputation 2/2 Charcot rocker-bottom deformity and insensate neuropathy ulceration.   Osteoporosis    PE (pulmonary embolism) 12/2007   a. PE/DVT after neck surgery 2009. b. coumadin d/c 10-2008.   PONV (postoperative nausea and vomiting) 2009   neck surgery   PPD positive    Recurrent UTI    RSD (reflex sympathetic dystrophy)    a. Chronic pain.   Spinal stenosis    Type II diabetes mellitus ([place])    no on medication   Venous insufficiency    a. Contributing to LEE.    Family History  Problem Relation Age of Onset   Heart disease Mother        mitral valve replaced   Arthritis Mother    Diabetic kidney disease Daughter    Diabetes Father    Stroke Father    Migraines Sister    Hypertension Other    Breast cancer Other    Colon cancer Neg Hx    Esophageal cancer Neg Hx    Stomach cancer Neg Hx    Rectal cancer Neg Hx     Past Surgical History:  Procedure Laterality Date   AMPUTATION  03/06/2012   Procedure: AMPUTATION FOOT;  Surgeon: Newt Minion, MD;  Location: [facility];  Service: Orthopedics;  Laterality: Left;  FIFTH RAY AMPUTATION    AMPUTATION Right 02/18/2016   Procedure: AMPUTATION BELOW KNEE;  Surgeon: Newt Minion, MD;  Location: [facility];  Service: Orthopedics;  Laterality: Right;   AMPUTATION Left 11/22/2016   Procedure: Amputation 4th Toe Left Foot at Metatarsophalangeal Joint;  Surgeon: Newt Minion, MD;  Location: [facility];  Service: Orthopedics;  Laterality: Left;   ANKLE FUSION  09/27/2012   Procedure: ANKLE FUSION;  Surgeon: Newt Minion, MD;  Location: [facility];  Service: Orthopedics;  Laterality: Left;  Left Tibiocalcaneal Fusion   ANKLE FUSION Right 05/09/2013  Procedure: ANKLE FUSION;  Surgeon: Newt Minion, MD;  Location: [facility];  Service: Orthopedics;  Laterality: Right;  Excision Osteomyelitis Base 1st MT Right Foot, Fusion Medial Column   ANTERIOR CERVICAL DECOMP/DISCECTOMY  FUSION  12/18/07   For OA,  Dr. Lorin Mercy:  fu by a PE   ANTERIOR CERVICAL DECOMP/DISCECTOMY FUSION N/A 11/29/2020   Procedure: Anterior Cervical Decompression Fusion - Cervical Three-Cerivcal Four;  Surgeon: Earnie Larsson, MD;  Location: [facility];  Service: Neurosurgery;  Laterality: N/A;  3C   CARDIAC CATHETERIZATION  05/2010    at [facility] Left 09/2011   CATARACT EXTRACTION W/ INTRAOCULAR LENS  IMPLANT, BILATERAL  2004   feb 2004 left, aug 2004 right   CHOLECYSTECTOMY  03/2002   CORONARY ANGIOPLASTY  10/31/2013   CORONARY ARTERY BYPASS GRAFT  09/27/2016   CYST REMOVAL HAND  06/2003   FOOT BONE EXCISION Right 06/2009   LAPAROSCOPIC RIGHT HEMI COLECTOMY N/A 01/08/2015   Procedure: LAPAROSCOPIC ASSISTED RIGHT HEMI COLECTOMY;  Surgeon: Johnathan Hausen, MD;  Location: [facility];  Service: General;  Laterality: N/A;   LEFT HEART CATHETERIZATION WITH CORONARY ANGIOGRAM N/A 10/31/2013   Procedure: LEFT HEART CATHETERIZATION WITH CORONARY ANGIOGRAM;  Surgeon: Jettie Booze, MD;  Location: [facility];  Service: Cardiovascular;  Laterality: N/A;   NASAL SEPTUM SURGERY  99/2426   NISSEN FUNDOPLICATION  05/26/4195   PERCUTANEOUS CORONARY INTERVENTION-BALLOON ONLY  10/31/2013   Procedure: PERCUTANEOUS CORONARY INTERVENTION-BALLOON ONLY;  Surgeon: Jettie Booze, MD;  Location: [facility];  Service: Cardiovascular;;   ROTATOR CUFF REPAIR Right 07/2007   Dr. Morene Rankins CUFF REPAIR Left 11/2004   stent in left leg, behind knee  06/27/2017   x2, done at [facility]ology in [place] Right 08/2008   3rd toe, Dr. Sharol Given due to osteomyelitis   VAGINAL HYSTERECTOMY  03/1975   VEIN LIGATION Bilateral 03/1966   VENA CAVA FILTER PLACEMENT  01/2010   green filter; "due to blood clots"   WISDOM TOOTH EXTRACTION  06/2007   Social History   Occupational History   Occupation: Retired     Comment: from social services   Tobacco Use   Smoking status: Never   Smokeless  tobacco: Never   Tobacco comments:    tried for a few months in college.   Vaping Use   Vaping Use: Never used  Substance and Sexual Activity   Alcohol use: No    Alcohol/week: 0.0 standard drinks of alcohol   Drug use: No   Sexual activity: Not Currently    Birth control/protection: Post-menopausal

## 2022-04-11 ENCOUNTER — Encounter: Payer: Self-pay | Admitting: Family

## 2022-04-11 ENCOUNTER — Other Ambulatory Visit: Payer: Self-pay

## 2022-04-11 ENCOUNTER — Inpatient Hospital Stay: Payer: Medicare Other | Attending: Hematology & Oncology

## 2022-04-11 ENCOUNTER — Telehealth: Payer: Self-pay | Admitting: *Deleted

## 2022-04-11 ENCOUNTER — Other Ambulatory Visit: Payer: Self-pay | Admitting: Oncology

## 2022-04-11 ENCOUNTER — Inpatient Hospital Stay (HOSPITAL_BASED_OUTPATIENT_CLINIC_OR_DEPARTMENT_OTHER): Payer: Medicare Other | Admitting: Family

## 2022-04-11 VITALS — BP 117/72 | HR 78 | Temp 98.1°F | Resp 18 | Ht 64.0 in | Wt 162.0 lb

## 2022-04-11 DIAGNOSIS — D649 Anemia, unspecified: Secondary | ICD-10-CM

## 2022-04-11 DIAGNOSIS — D509 Iron deficiency anemia, unspecified: Secondary | ICD-10-CM

## 2022-04-11 DIAGNOSIS — Z7901 Long term (current) use of anticoagulants: Secondary | ICD-10-CM

## 2022-04-11 DIAGNOSIS — I2510 Atherosclerotic heart disease of native coronary artery without angina pectoris: Secondary | ICD-10-CM | POA: Diagnosis not present

## 2022-04-11 DIAGNOSIS — D696 Thrombocytopenia, unspecified: Secondary | ICD-10-CM | POA: Diagnosis not present

## 2022-04-11 DIAGNOSIS — K922 Gastrointestinal hemorrhage, unspecified: Secondary | ICD-10-CM

## 2022-04-11 LAB — CMP (CANCER CENTER ONLY)
ALT: 7 U/L (ref 0–44)
AST: 10 U/L — ABNORMAL LOW (ref 15–41)
Albumin: 3.3 g/dL — ABNORMAL LOW (ref 3.5–5.0)
Alkaline Phosphatase: 39 U/L (ref 38–126)
Anion gap: 3 — ABNORMAL LOW (ref 5–15)
BUN: 17 mg/dL (ref 8–23)
CO2: 19 mmol/L — ABNORMAL LOW (ref 22–32)
Calcium: 8 mg/dL — ABNORMAL LOW (ref 8.9–10.3)
Chloride: 119 mmol/L — ABNORMAL HIGH (ref 98–111)
Creatinine: 1.17 mg/dL — ABNORMAL HIGH (ref 0.44–1.00)
GFR, Estimated: 46 mL/min — ABNORMAL LOW (ref >60)
Glucose, Bld: 111 mg/dL — ABNORMAL HIGH (ref 70–99)
Potassium: 4.8 mmol/L (ref 3.5–5.1)
Sodium: 141 mmol/L (ref 135–145)
Total Bilirubin: 0.3 mg/dL (ref 0.3–1.2)
Total Protein: 5.7 g/dL — ABNORMAL LOW (ref 6.5–8.1)

## 2022-04-11 LAB — CBC WITH DIFFERENTIAL (CANCER CENTER ONLY)
Abs Immature Granulocytes: 0.02 10*3/uL (ref 0.00–0.07)
Basophils Absolute: 0 10*3/uL (ref 0.0–0.1)
Basophils Relative: 1 %
Eosinophils Absolute: 0.1 10*3/uL (ref 0.0–0.5)
Eosinophils Relative: 4 %
HCT: 31.2 % — ABNORMAL LOW (ref 36.0–46.0)
Hemoglobin: 9.2 g/dL — ABNORMAL LOW (ref 12.0–15.0)
Immature Granulocytes: 1 %
Lymphocytes Relative: 37 %
Lymphs Abs: 1.3 10*3/uL (ref 0.7–4.0)
MCH: 30.4 pg (ref 26.0–34.0)
MCHC: 29.5 g/dL — ABNORMAL LOW (ref 30.0–36.0)
MCV: 103 fL — ABNORMAL HIGH (ref 80.0–100.0)
Monocytes Absolute: 0.3 10*3/uL (ref 0.1–1.0)
Monocytes Relative: 8 %
Neutro Abs: 1.7 10*3/uL (ref 1.7–7.7)
Neutrophils Relative %: 49 %
Platelet Count: 122 10*3/uL — ABNORMAL LOW (ref 150–400)
RBC: 3.03 MIL/uL — ABNORMAL LOW (ref 3.87–5.11)
RDW: 14.9 % (ref 11.5–15.5)
WBC Count: 3.4 10*3/uL — ABNORMAL LOW (ref 4.0–10.5)
nRBC: 0 % (ref 0.0–0.2)

## 2022-04-11 LAB — FERRITIN: Ferritin: 19 ng/mL (ref 11–307)

## 2022-04-11 LAB — RETICULOCYTES
Immature Retic Fract: 21.6 % — ABNORMAL HIGH (ref 2.3–15.9)
RBC.: 2.98 MIL/uL — ABNORMAL LOW (ref 3.87–5.11)
Retic Count, Absolute: 72.7 10*3/uL (ref 19.0–186.0)
Retic Ct Pct: 2.4 % (ref 0.4–3.1)

## 2022-04-11 LAB — SAVE SMEAR(SSMR), FOR PROVIDER SLIDE REVIEW

## 2022-04-11 NOTE — Progress Notes (Incomplete)
Hematology/Oncology Consultation   Name: [name]      MRN: [id number]    Location: Room/bed info not found  Date: 04/11/2022 Time:2:52 PM   REFERRING PHYSICIAN: [name], MD   REASON FOR CONSULT: Anemia and thrombocytopenia    DIAGNOSIS:  Iron deficiency anemia  Mild thrombocytopenia   HISTORY OF PRESENT ILLNESS: [name] is a very pleasant 87 yo caucasian female with   Recent stool check in March with GI was positive for C. Diff.    Her last colonoscopy was in March 2017. She had 3 polyps removed and was noted to have multiple small and large mouth diverticula with in the sigmoid and descending colon.   ROS: All other 10 point review of systems is negative.   PAST MEDICAL HISTORY:   Past Medical History:  Diagnosis Date   Allergic rhinitis    Arthritis    "back; ankles; hands; knees" (10/31/2013)   Bilateral sensorineural hearing loss    CAD (coronary artery disease)    a. Nonobst in 2011. b. Abnormal nuc 09/2013 -> s/p cutting balloon to D2, mild LAD disease; c. 08/2015 MV: no ischemia, EF 71%.   Carcinoma in situ in a polyp 1994   a. 1994 - malignant polyp removed during colonoscopy.   Chronic diastolic CHF (congestive heart failure) ([name])    a. 09/2013 Echo: EF 60-65%, mild LVH, Gr1 DD.   Dementia ([place])    Diverticulosis    DJD (degenerative joint disease)    Fatty liver    GERD (gastroesophageal reflux disease)    a. Hx GERD/esophageal dysmotility followed by [name].    Hemorrhoids    Hiatal hernia    a. s/p Nissen fundoplication 2778.   Hyperlipidemia    a. patient unwilling to use statins.   Hypertensive heart disease    IBS (irritable bowel syndrome)    Macular degeneration 03/2009   [name]   Neuropathy    a. Hands, feet, legs.   Orthostatic hypotension    Osteomyelitis (HCC)    a. 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 01/2016 s/p RLE transtibial amputation 2/2 Charcot rocker-bottom  deformity and insensate neuropathy ulceration.   Osteoporosis    PE (pulmonary embolism) 12/2007   a. PE/DVT after neck surgery 2009. b. coumadin d/c 10-2008.   PONV (postoperative nausea and vomiting) 2009   neck surgery   PPD positive    Recurrent UTI    RSD (reflex sympathetic dystrophy)    a. Chronic pain.   Spinal stenosis    Type II diabetes mellitus ([place])    no on medication   Venous insufficiency    a. Contributing to LEE.    ALLERGIES: Allergies  Allergen Reactions   Cymbalta [Duloxetine Hcl] Swelling    Swelling in legs   Gabapentin Swelling    Swelling in legs Swelling in legs   Silicone Hives, Itching, Dermatitis and Rash   Metformin And Related Other (See Comments)    dizzy, tired, chills, diarrhea, and nausea   Tramadol Swelling   Adhesive [Tape] Rash    rash rash   Cefazolin Hives    Hives Hives    Ciprofloxacin Other (See Comments)    Other reaction(s): Other (See Comments) Per pt, caused body aches Per pt, caused body aches    Clorazepate Dipotassium Other (See Comments)    Unknown reaction   Dilaudid [Hydromorphone Hcl] Other (See Comments)     confused, intense itching  Doxycycline Rash   Enablex [Darifenacin Hydrobromide Er] Other (See Comments)    Hypotension, near syncope   Levofloxacin Other (See Comments)    Causes wrist pain   Lyrica [Pregabalin] Swelling    Swelling in legs   Methadone Hcl Other (See Comments)    Reaction unknown   Morphine And Related Other (See Comments)    Confusion, constipation.    Talwin [Pentazocine] Other (See Comments)    "climbing walls" anxiety      MEDICATIONS:  Current Outpatient Medications on File Prior to Visit  Medication Sig Dispense Refill   Alpha-Lipoic Acid 600 MG CAPS Take 1,200 mg by mouth daily.     amitriptyline (ELAVIL) 25 MG tablet Take 1 tablet (25 mg total) by mouth at bedtime. 90 tablet 1   Ascorbic Acid (VITAMIN C WITH ROSE HIPS) 500 MG tablet Take 500 mg by mouth in the morning  and at bedtime.     atorvastatin (LIPITOR) 40 MG tablet Take 40 mg by mouth daily.     carvedilol (COREG) 12.5 MG tablet Take 12.5 mg by mouth 2 (two) times daily with a meal.     Cholecalciferol (VITAMIN D3) 5000 UNITS CAPS Take 5,000 Units by mouth every evening. Reported on 11/24/2015     CINNAMON PO Take 2,000 mg by mouth daily.     clopidogrel (PLAVIX) 75 MG tablet Take 1 tablet (75 mg total) by mouth at bedtime. Restart on 12/04/2020     Cyanocobalamin (VITAMIN B-12) 5000 MCG SUBL Place 5,000 mcg under the tongue daily.     dextromethorphan-guaiFENesin (TUSSIN DM) 10-100 MG/5ML liquid Take by mouth every 4 (four) hours as needed for cough.     diclofenac Sodium (VOLTAREN) 1 % GEL Apply 4 g topically 4 (four) times daily. (Patient taking differently: Apply 4 g topically 4 (four) times daily. If needed for pain) 100 g 0   dicyclomine (BENTYL) 10 MG capsule TAKE 1 CAPSULE THREE TIMES A DAY BEFORE MEALS (NEED APPOINTMENT) 360 capsule 3   donepezil (ARICEPT) 10 MG tablet Take 1 tablet (10 mg total) by mouth at bedtime. 90 tablet 1   famotidine (PEPCID) 20 MG tablet Take 1 tablet (20 mg total) by mouth 2 (two) times daily. 180 tablet 1   ferrous sulfate (FEROSUL) 325 (65 FE) MG tablet Take 1 tablet (325 mg total) by mouth 2 (two) times daily with a meal. 180 tablet 3   furosemide (LASIX) 20 MG tablet Take 20 mg by mouth daily.     Galcanezumab-gnlm (EMGALITY) 120 MG/ML SOAJ Inject 120 mg into the skin every 30 (thirty) days. 1.12 mL 11   glucose blood (FREESTYLE LITE) test strip USE TO CHECK BLOOD SUGAR NO MORE THAN TWICE DAILY 200 each 12   HYDROcodone-acetaminophen (NORCO) 10-325 MG tablet Take 1 tablet by mouth every 6 (six) hours as needed. 120 tablet 0   hydrocortisone (ANUSOL-HC) 2.5 % rectal cream Place 1 application rectally 2 (two) times daily. As needed 30 g 3   hydrocortisone (ANUSOL-HC) 25 MG suppository Place 1 suppository (25 mg total) rectally 2 (two) times daily. As needed 12  suppository 5   ketoconazole (NIZORAL) 2 % cream Apply 1 application. topically daily. 60 g 0   Lancets (FREESTYLE) lancets CHECK BLOOD SUGAR TWICE DAILY 200 each 12   linaclotide (LINZESS) 72 MCG capsule Take 1 capsule (72 mcg total) by mouth daily before breakfast. 90 capsule 3   loperamide (IMODIUM A-D) 2 MG tablet Take 2 mg by mouth as needed for  diarrhea or loose stools.     MegaRed Omega-3 Krill Oil 500 MG CAPS Restart on 12/04/2020     methenamine (HIPREX) 1 g tablet Take 0.5 tablets by mouth daily.  4   Multiple Vitamins-Minerals (PRESERVISION AREDS 2) CAPS Take 1 capsule by mouth 2 (two) times daily.     naloxone (NARCAN) nasal spray 4 mg/0.1 mL Place 1 spray into the nose once as needed for up to 1 dose. 2 each 0   nitroGLYCERIN (NITROSTAT) 0.4 MG SL tablet Place 1 tablet (0.4 mg total) under the tongue every 5 (five) minutes x 3 doses as needed for chest pain. 25 tablet 3   omeprazole (PRILOSEC) 20 MG capsule Take 1 capsule (20 mg total) by mouth 2 (two) times daily before a meal. 180 capsule 3   ondansetron (ZOFRAN) 4 MG tablet TAKE 1 TABLET(4 MG) BY MOUTH EVERY 8 HOURS AS NEEDED FOR NAUSEA OR VOMITING 40 tablet 1   OVER THE COUNTER MEDICATION 1 tablet 3 (three) times daily. Berberorine Gluco Defense     Polyvinyl Alcohol-Povidone (REFRESH OP) Place 1 drop into both eyes 3 (three) times daily as needed (dry eyes).      pramipexole (MIRAPEX) 0.125 MG tablet TAKE 1 TO 2 TABLETS(0.125 TO 0.25 MG) BY MOUTH AT BEDTIME AS NEEDED 180 tablet 1   predniSONE (DELTASONE) 10 MG tablet Take 1 tablet (10 mg total) by mouth daily with breakfast. 30 tablet 0   Probiotic Product (PROBIOTIC PEARLS PO) Take 1 tablet by mouth daily.     rivaroxaban (XARELTO) 2.5 MG TABS tablet Take 1 tablet (2.5 mg total) by mouth 2 (two) times daily. Restart on 12/04/2020 60 tablet    silver sulfADIAZINE (SILVADENE) 1 % cream Apply 1 application topically daily. 50 g 0   vitamin E 180 MG (400 UNITS) capsule Take 400 Units  by mouth daily.     No current facility-administered medications on file prior to visit.     PAST SURGICAL HISTORY Past Surgical History:  Procedure Laterality Date   AMPUTATION  03/06/2012   Procedure: AMPUTATION FOOT;  Surgeon: Newt Minion, MD;  Location: [place];  Service: Orthopedics;  Laterality: Left;  FIFTH RAY AMPUTATION    AMPUTATION Right 02/18/2016   Procedure: AMPUTATION BELOW KNEE;  Surgeon: Newt Minion, MD;  Location: [place];  Service: Orthopedics;  Laterality: Right;   AMPUTATION Left 11/22/2016   Procedure: Amputation 4th Toe Left Foot at Metatarsophalangeal Joint;  Surgeon: Newt Minion, MD;  Location: [place];  Service: Orthopedics;  Laterality: Left;   ANKLE FUSION  09/27/2012   Procedure: ANKLE FUSION;  Surgeon: Newt Minion, MD;  Location: [place];  Service: Orthopedics;  Laterality: Left;  Left Tibiocalcaneal Fusion   ANKLE FUSION Right 05/09/2013   Procedure: ANKLE FUSION;  Surgeon: Newt Minion, MD;  Location: [place];  Service: Orthopedics;  Laterality: Right;  Excision Osteomyelitis Base 1st MT Right Foot, Fusion Medial Column   ANTERIOR CERVICAL DECOMP/DISCECTOMY FUSION  12/18/07   For OA,  Dr. Lorin Mercy:  fu by a PE   ANTERIOR CERVICAL DECOMP/DISCECTOMY FUSION N/A 11/29/2020   Procedure: Anterior Cervical Decompression Fusion - Cervical Three-Cerivcal Four;  Surgeon: Earnie Larsson, MD;  Location: [place];  Service: Neurosurgery;  Laterality: N/A;  3C   CARDIAC CATHETERIZATION  05/2010    at [place] Left 09/2011   CATARACT EXTRACTION W/ INTRAOCULAR LENS  IMPLANT, BILATERAL  2004   feb 2004 left, aug 2004  right   CHOLECYSTECTOMY  03/2002   CORONARY ANGIOPLASTY  10/31/2013   CORONARY ARTERY BYPASS GRAFT  09/27/2016   CYST REMOVAL HAND  06/2003   FOOT BONE EXCISION Right 06/2009   LAPAROSCOPIC RIGHT HEMI COLECTOMY N/A 01/08/2015   Procedure: LAPAROSCOPIC ASSISTED RIGHT HEMI COLECTOMY;  Surgeon: Johnathan Hausen, MD;  Location: [facility];  Service: General;   Laterality: N/A;   LEFT HEART CATHETERIZATION WITH CORONARY ANGIOGRAM N/A 10/31/2013   Procedure: LEFT HEART CATHETERIZATION WITH CORONARY ANGIOGRAM;  Surgeon: Jettie Booze, MD;  Location: [facility] C[facility];  Service: Cardiovascular;  Laterality: N/A;   NASAL SEPTUM SURGERY  [id number]/[id number]   NISSEN FUNDOPLICATION  0/10/7508   PERCUTANEOUS CORONARY INTERVENTION-BALLOON ONLY  10/31/2013   Procedure: PERCUTANEOUS CORONARY INTERVENTION-BALLOON ONLY;  Surgeon: Jettie Booze, MD;  Location: [facility] CATH LAB;  Service: Cardiovascular;;   ROTATOR CUFF REPAIR Right 07/2007   Dr. Morene Rankins CUFF REPAIR Left 11/2004   stent in left leg, behind knee  06/27/2017   x2, done at [facility]   TOE AMPUTATION Right 08/2008   3rd toe, Dr. Sharol Given due to osteomyelitis   VAGINAL HYSTERECTOMY  03/1975   VEIN LIGATION Bilateral 03/1966   VENA CAVA FILTER PLACEMENT  01/2010   green filter; "due to blood clots"   WISDOM TOOTH EXTRACTION  06/2007    FAMILY HISTORY: Family History  Problem Relation Age of Onset   Heart disease Mother        mitral valve replaced   Arthritis Mother    Diabetic kidney disease Daughter    Diabetes Father    Stroke Father    Migraines Sister    Hypertension Other    Breast cancer Other    Colon cancer Neg Hx    Esophageal cancer Neg Hx    Stomach cancer Neg Hx    Rectal cancer Neg Hx     SOCIAL HISTORY:  reports that she has never smoked. She has never used smokeless tobacco. She reports that she does not drink alcohol and does not use drugs.  PERFORMANCE STATUS: The patient's performance status is {CHL ONC ECOG CH:[id number]}  PHYSICAL EXAM: Most Recent Vital Signs: There were no vitals taken for this visit. {Exam, Complete:17964}  LABORATORY DATA:  No results found. However, due to the size of the patient record, not all encounters were searched. Please check Results Review for a complete set of results.    RADIOGRAPHY: XR Shoulder Right  Result  Date: 04/10/2022 2 view radiographs of the right shoulder show superior migration of the humeral head within the glenohumeral joint consistent with chronic rotator cuff pathology.  XR Cervical Spine 2 or 3 views  Result Date: 04/10/2022 2 view radiographs of the cervical spine shows hardware for most recent fusion C3-4 with 2 level fusion below this.  No hardware failure.      PATHOLOGY: None  ASSESSMENT/PLAN:   All questions were answered. The patient knows to call the clinic with any problems, questions or concerns. We can certainly see the patient much sooner if necessary.   Lottie Dawson, NP

## 2022-04-11 NOTE — Telephone Encounter (Signed)
Per 04/11/22 los - gave upcoming appointments - confirmed 

## 2022-04-12 ENCOUNTER — Inpatient Hospital Stay: Payer: Medicare Other

## 2022-04-12 VITALS — BP 103/78 | HR 70 | Temp 97.7°F | Resp 20

## 2022-04-12 DIAGNOSIS — D509 Iron deficiency anemia, unspecified: Secondary | ICD-10-CM

## 2022-04-12 DIAGNOSIS — Z7901 Long term (current) use of anticoagulants: Secondary | ICD-10-CM | POA: Diagnosis not present

## 2022-04-12 DIAGNOSIS — I2510 Atherosclerotic heart disease of native coronary artery without angina pectoris: Secondary | ICD-10-CM | POA: Diagnosis not present

## 2022-04-12 DIAGNOSIS — D696 Thrombocytopenia, unspecified: Secondary | ICD-10-CM | POA: Diagnosis not present

## 2022-04-12 LAB — IRON AND IRON BINDING CAPACITY (CC-WL,HP ONLY)
Iron: 48 ug/dL (ref 28–170)
Saturation Ratios: 16 % (ref 10.4–31.8)
TIBC: 301 ug/dL (ref 250–450)
UIBC: 253 ug/dL (ref 148–442)

## 2022-04-12 LAB — ERYTHROPOIETIN: Erythropoietin: 50 m[IU]/mL — ABNORMAL HIGH (ref 2.6–18.5)

## 2022-04-12 MED ORDER — SODIUM CHLORIDE 0.9 % IV SOLN
1000.0000 mg | Freq: Once | INTRAVENOUS | Status: AC
  Administered 2022-04-12: 1000 mg via INTRAVENOUS
  Filled 2022-04-12: qty 10

## 2022-04-12 MED ORDER — SODIUM CHLORIDE 0.9 % IV SOLN: Freq: Once | INTRAVENOUS | Status: AC

## 2022-04-12 NOTE — Patient Instructions (Signed)

## 2022-04-13 ENCOUNTER — Other Ambulatory Visit: Payer: Self-pay | Admitting: *Deleted

## 2022-04-13 DIAGNOSIS — D509 Iron deficiency anemia, unspecified: Secondary | ICD-10-CM

## 2022-04-13 DIAGNOSIS — D649 Anemia, unspecified: Secondary | ICD-10-CM

## 2022-04-13 LAB — OCCULT BLOOD X 1 CARD TO LAB, STOOL
Fecal Occult Bld: NEGATIVE
Fecal Occult Bld: POSITIVE — AB
Fecal Occult Bld: POSITIVE — AB

## 2022-04-17 ENCOUNTER — Encounter: Payer: Self-pay | Admitting: Family

## 2022-04-18 ENCOUNTER — Encounter: Payer: Self-pay | Admitting: Internal Medicine

## 2022-04-18 DIAGNOSIS — L97409 Non-pressure chronic ulcer of unspecified heel and midfoot with unspecified severity: Secondary | ICD-10-CM

## 2022-04-20 ENCOUNTER — Telehealth: Payer: Self-pay | Admitting: Orthopedic Surgery

## 2022-04-20 NOTE — Telephone Encounter (Signed)
[facility] requesting 04/10/22 ov note I faxed to [phone]/ ph [phone]

## 2022-04-21 ENCOUNTER — Other Ambulatory Visit: Payer: Self-pay | Admitting: Internal Medicine

## 2022-04-21 NOTE — Telephone Encounter (Signed)
Last Prolia inj 03/28/22 Next Prolia inj due 09/28/22

## 2022-05-03 DIAGNOSIS — I70213 Atherosclerosis of native arteries of extremities with intermittent claudication, bilateral legs: Secondary | ICD-10-CM | POA: Diagnosis not present

## 2022-05-12 DIAGNOSIS — I70244 Atherosclerosis of native arteries of left leg with ulceration of heel and midfoot: Secondary | ICD-10-CM | POA: Diagnosis not present

## 2022-05-12 DIAGNOSIS — I70245 Atherosclerosis of native arteries of left leg with ulceration of other part of foot: Secondary | ICD-10-CM | POA: Diagnosis not present

## 2022-05-12 DIAGNOSIS — I70213 Atherosclerosis of native arteries of extremities with intermittent claudication, bilateral legs: Secondary | ICD-10-CM | POA: Diagnosis not present

## 2022-05-12 DIAGNOSIS — E1152 Type 2 diabetes mellitus with diabetic peripheral angiopathy with gangrene: Secondary | ICD-10-CM | POA: Diagnosis not present

## 2022-05-15 ENCOUNTER — Inpatient Hospital Stay: Payer: Medicare Other | Attending: Hematology & Oncology

## 2022-05-15 ENCOUNTER — Telehealth: Payer: Self-pay | Admitting: Internal Medicine

## 2022-05-15 ENCOUNTER — Inpatient Hospital Stay (HOSPITAL_BASED_OUTPATIENT_CLINIC_OR_DEPARTMENT_OTHER): Payer: Medicare Other | Admitting: Family

## 2022-05-15 DIAGNOSIS — D696 Thrombocytopenia, unspecified: Secondary | ICD-10-CM | POA: Insufficient documentation

## 2022-05-15 DIAGNOSIS — D509 Iron deficiency anemia, unspecified: Secondary | ICD-10-CM | POA: Diagnosis not present

## 2022-05-15 DIAGNOSIS — D500 Iron deficiency anemia secondary to blood loss (chronic): Secondary | ICD-10-CM | POA: Insufficient documentation

## 2022-05-15 DIAGNOSIS — K922 Gastrointestinal hemorrhage, unspecified: Secondary | ICD-10-CM | POA: Diagnosis not present

## 2022-05-15 LAB — RETICULOCYTES
Immature Retic Fract: 14 % (ref 2.3–15.9)
RBC.: 3.22 MIL/uL — ABNORMAL LOW (ref 3.87–5.11)
Retic Count, Absolute: 74.7 10*3/uL (ref 19.0–186.0)
Retic Ct Pct: 2.3 % (ref 0.4–3.1)

## 2022-05-15 LAB — CBC WITH DIFFERENTIAL (CANCER CENTER ONLY)
Abs Immature Granulocytes: 0.01 10*3/uL (ref 0.00–0.07)
Basophils Absolute: 0 10*3/uL (ref 0.0–0.1)
Basophils Relative: 0 %
Eosinophils Absolute: 0.1 10*3/uL (ref 0.0–0.5)
Eosinophils Relative: 2 %
HCT: 32.8 % — ABNORMAL LOW (ref 36.0–46.0)
Hemoglobin: 10.1 g/dL — ABNORMAL LOW (ref 12.0–15.0)
Immature Granulocytes: 0 %
Lymphocytes Relative: 36 %
Lymphs Abs: 1.5 10*3/uL (ref 0.7–4.0)
MCH: 31.3 pg (ref 26.0–34.0)
MCHC: 30.8 g/dL (ref 30.0–36.0)
MCV: 101.5 fL — ABNORMAL HIGH (ref 80.0–100.0)
Monocytes Absolute: 0.2 10*3/uL (ref 0.1–1.0)
Monocytes Relative: 6 %
Neutro Abs: 2.3 10*3/uL (ref 1.7–7.7)
Neutrophils Relative %: 56 %
Platelet Count: 107 10*3/uL — ABNORMAL LOW (ref 150–400)
RBC: 3.23 MIL/uL — ABNORMAL LOW (ref 3.87–5.11)
RDW: 15.5 % (ref 11.5–15.5)
WBC Count: 4 10*3/uL (ref 4.0–10.5)
nRBC: 0 % (ref 0.0–0.2)

## 2022-05-15 LAB — FERRITIN: Ferritin: 138 ng/mL (ref 11–307)

## 2022-05-15 LAB — SAMPLE TO BLOOD BANK

## 2022-05-15 NOTE — Progress Notes (Unsigned)
Hematology and Oncology Follow Up Visit  [name] [id number] 1935/06/12 87 y.o. 05/16/2022   Principle Diagnosis:  Iron deficiency anemia  Mild thrombocytopenia   Current Therapy:   IV iron as indicated    Interim History:  [name] is here today with her son for follow-up. She is doing fairly well but still notes some fatigue at times.  She had an episodes of chills that lasted 2 days a few weeks ago. She is unsure what may have caused this. She has had no other episodes.  No fever, n/v, cough, rash, dizziness, SOB, chest pain, palpitations, abdominal pain or changes in bowel or bladder habits.  No blood loss noted. No bruising or petechiae She is seeing a wound care specialist in [place] for a wound on her left heel. and will be having weekly checks. No numbness or tingling in her extremities at this time.  No falls or syncope reported.  Appetite and hydration are good.  ECOG Performance Status: 1 - Symptomatic but completely ambulatory  Medications:  Allergies as of 05/15/2022       Reactions   Metformin And Related Diarrhea, Nausea Only, Other (See Comments)   dizzy, tired, chills   Cymbalta [duloxetine Hcl] Swelling   Swelling in legs   Gabapentin Swelling   Swelling in legs   Silicone Hives, Itching, Dermatitis, Rash   Tramadol Swelling   Adhesive [tape] Rash   Cefazolin Hives      Ciprofloxacin Other (See Comments)   body aches   Clorazepate Dipotassium Other (See Comments)   Unknown reaction   Dilaudid [hydromorphone Hcl] Itching    confused,   Doxycycline Rash   Enablex [darifenacin Hydrobromide Er] Other (See Comments)   Hypotension, near syncope   Levofloxacin Other (See Comments)   Causes wrist pain   Lyrica [pregabalin] Swelling   Swelling in legs   Methadone Hcl Other (See Comments)   Reaction unknown   Morphine And Related Other (See Comments)   Confusion, constipation.   Talwin [pentazocine] Other (See Comments)   "climbing walls"  anxiety        Medication List        Accurate as of May 15, 2022 11:59 PM. If you have any questions, ask your nurse or doctor.          Alpha-Lipoic Acid 600 MG Caps Take 1,200 mg by mouth daily.   amitriptyline 25 MG tablet Commonly known as: ELAVIL Take 1 tablet (25 mg total) by mouth at bedtime.   atorvastatin 40 MG tablet Commonly known as: LIPITOR Take 40 mg by mouth daily.   carvedilol 12.5 MG tablet Commonly known as: COREG Take 12.5 mg by mouth 2 (two) times daily with a meal.   CINNAMON PO Take 2,000 mg by mouth daily.   clopidogrel 75 MG tablet Commonly known as: PLAVIX Take 1 tablet (75 mg total) by mouth at bedtime. Restart on 12/04/2020   diclofenac Sodium 1 % Gel Commonly known as: VOLTAREN Apply 4 g topically 4 (four) times daily. What changed: additional instructions   dicyclomine 10 MG capsule Commonly known as: BENTYL TAKE 1 CAPSULE THREE TIMES A DAY BEFORE MEALS (NEED APPOINTMENT)   donepezil 10 MG tablet Commonly known as: ARICEPT TAKE 1 TABLET AT BEDTIME   Emgality 120 MG/ML Soaj Generic drug: Galcanezumab-gnlm Inject 120 mg into the skin every 30 (thirty) days.   famotidine 20 MG tablet Commonly known as: PEPCID Take 1 tablet (20 mg total) by mouth 2 (two) times daily.  ferrous sulfate 325 (65 FE) MG tablet Commonly known as: FeroSul Take 1 tablet (325 mg total) by mouth 2 (two) times daily with a meal.   freestyle lancets CHECK BLOOD SUGAR TWICE DAILY   FREESTYLE LITE test strip Generic drug: glucose blood USE TO CHECK BLOOD SUGAR NO MORE THAN TWICE DAILY   furosemide 20 MG tablet Commonly known as: LASIX Take 20 mg by mouth daily.   HYDROcodone-acetaminophen 10-325 MG tablet Commonly known as: NORCO Take 1 tablet by mouth every 6 (six) hours as needed.   hydrocortisone 2.5 % rectal cream Commonly known as: ANUSOL-HC Place 1 application rectally 2 (two) times daily. As needed   hydrocortisone 25 MG  suppository Commonly known as: ANUSOL-HC Place 1 suppository (25 mg total) rectally 2 (two) times daily. As needed   ketoconazole 2 % cream Commonly known as: NIZORAL Apply 1 application. topically daily.   linaclotide 72 MCG capsule Commonly known as: Linzess Take 1 capsule (72 mcg total) by mouth daily before breakfast.   loperamide 2 MG tablet Commonly known as: IMODIUM A-D Take 2 mg by mouth as needed for diarrhea or loose stools.   MegaRed Omega-3 Krill Oil 500 MG Caps Restart on 12/04/2020   methenamine 1 g tablet Commonly known as: HIPREX Take 0.5 tablets by mouth daily.   naloxone 4 MG/0.1ML Liqd nasal spray kit Commonly known as: NARCAN Place 1 spray into the nose once as needed for up to 1 dose.   nitroGLYCERIN 0.4 MG SL tablet Commonly known as: NITROSTAT Place 1 tablet (0.4 mg total) under the tongue every 5 (five) minutes x 3 doses as needed for chest pain.   omeprazole 20 MG capsule Commonly known as: PRILOSEC Take 1 capsule (20 mg total) by mouth 2 (two) times daily before a meal.   ondansetron 4 MG tablet Commonly known as: ZOFRAN TAKE 1 TABLET(4 MG) BY MOUTH EVERY 8 HOURS AS NEEDED FOR NAUSEA OR VOMITING   OVER THE COUNTER MEDICATION 1 tablet 3 (three) times daily. Berberorine Gluco Defense   pramipexole 0.125 MG tablet Commonly known as: MIRAPEX TAKE 1 TO 2 TABLETS(0.125 TO 0.25 MG) BY MOUTH AT BEDTIME AS NEEDED   predniSONE 10 MG tablet Commonly known as: DELTASONE Take 1 tablet (10 mg total) by mouth daily with breakfast.   PreserVision AREDS 2 Caps Take 1 capsule by mouth 2 (two) times daily.   PROBIOTIC PEARLS PO Take 1 tablet by mouth daily.   REFRESH OP Place 1 drop into both eyes 3 (three) times daily as needed (dry eyes).   rivaroxaban 2.5 MG Tabs tablet Commonly known as: XARELTO Take 1 tablet (2.5 mg total) by mouth 2 (two) times daily. Restart on 12/04/2020   silver sulfADIAZINE 1 % cream Commonly known as: Silvadene Apply 1  application topically daily.   Tussin DM 10-100 MG/5ML liquid Generic drug: dextromethorphan-guaiFENesin Take by mouth every 4 (four) hours as needed for cough.   Vitamin B-12 5000 MCG Subl Place 5,000 mcg under the tongue daily.   vitamin C with rose hips 500 MG tablet Take 500 mg by mouth in the morning and at bedtime.   Vitamin D3 125 MCG (5000 UT) Caps Take 5,000 Units by mouth every evening. Reported on 11/24/2015   vitamin E 180 MG (400 UNITS) capsule Take 400 Units by mouth daily.        Allergies:  Allergies  Allergen Reactions   Metformin And Related Diarrhea, Nausea Only and Other (See Comments)    dizzy, tired,  chills   Cymbalta [Duloxetine Hcl] Swelling    Swelling in legs   Gabapentin Swelling    Swelling in legs   Silicone Hives, Itching, Dermatitis and Rash   Tramadol Swelling   Adhesive [Tape] Rash   Cefazolin Hives        Ciprofloxacin Other (See Comments)    body aches    Clorazepate Dipotassium Other (See Comments)    Unknown reaction   Dilaudid [Hydromorphone Hcl] Itching     confused,   Doxycycline Rash   Enablex [Darifenacin Hydrobromide Er] Other (See Comments)    Hypotension, near syncope   Levofloxacin Other (See Comments)    Causes wrist pain   Lyrica [Pregabalin] Swelling    Swelling in legs   Methadone Hcl Other (See Comments)    Reaction unknown   Morphine And Related Other (See Comments)    Confusion, constipation.    Talwin [Pentazocine] Other (See Comments)    "climbing walls" anxiety    Past Medical History, Surgical history, Social history, and Family History were reviewed and updated.  Review of Systems: All other 10 point review of systems is negative.   Physical Exam:  vitals were not taken for this visit.   Wt Readings from Last 3 Encounters:  04/11/22 162 lb (73.5 kg)  04/03/22 161 lb (73 kg)  03/27/22 161 lb (73 kg)    Ocular: Sclerae unicteric, pupils equal, round and reactive to light Ear-nose-throat:  Oropharynx clear, dentition fair Lymphatic: No cervical or supraclavicular adenopathy Lungs no rales or rhonchi, good excursion bilaterally Heart regular rate and rhythm, no murmur appreciated Abd soft, nontender, positive bowel sounds MSK no focal spinal tenderness, no joint edema Neuro: non-focal, well-oriented, appropriate affect Breasts: Deferred   Lab Results  Component Value Date   WBC 4.0 05/15/2022   HGB 10.1 (L) 05/15/2022   HCT 32.8 (L) 05/15/2022   MCV 101.5 (H) 05/15/2022   PLT 107 (L) 05/15/2022   Lab Results  Component Value Date   FERRITIN 138 05/15/2022   IRON 48 04/11/2022   TIBC 301 04/11/2022   UIBC 253 04/11/2022   IRONPCTSAT 16 04/11/2022   Lab Results  Component Value Date   RETICCTPCT 2.3 05/15/2022   RBC 3.23 (L) 05/15/2022   RBC 3.22 (L) 05/15/2022   No results found for: "KPAFRELGTCHN", "LAMBDASER", "KAPLAMBRATIO" No results found for: "IGGSERUM", "IGA", "IGMSERUM" No results found for: "TOTALPROTELP", "ALBUMINELP", "A1GS", "A2GS", "BETS", "BETA2SER", "GAMS", "MSPIKE", "SPEI"   Chemistry      Component Value Date/Time   NA 141 04/11/2022 1443   NA 140 09/03/2020 0000   K 4.8 04/11/2022 1443   CL 119 (H) 04/11/2022 1443   CO2 19 (L) 04/11/2022 1443   BUN 17 04/11/2022 1443   BUN 17 09/03/2020 0000   CREATININE 1.17 (H) 04/11/2022 1443   CREATININE 0.89 (H) 08/27/2020 1634   GLU 146 09/03/2020 0000      Component Value Date/Time   CALCIUM 8.0 (L) 04/11/2022 1443   ALKPHOS 39 04/11/2022 1443   AST 10 (L) 04/11/2022 1443   ALT 7 04/11/2022 1443   BILITOT 0.3 04/11/2022 1443       Impression and Plan: [name] is a very pleasant 87 yo caucasian female with iron deficiency anemia secondary to GI blood loss and mild thrombocytopenia.  Iron studies are pending.  Follow-up in 3 months.   Lottie Dawson, NP 7/25/20239:08 AM

## 2022-05-15 NOTE — Telephone Encounter (Signed)
Patient's son, Dellis Filbert, called to see if Dr. Larose Kells can write a letter for him to be able to handle his sister's estate. He hired an Forensic psychologist to help him close out his sister's estate and they are requesting written documentation from patient's Primary cary provider advising whether she is competent/not competent to sign a waiver granting him permission to handle his sister's estate. Letter can be sent to them via mychart.

## 2022-05-16 ENCOUNTER — Encounter: Payer: Self-pay | Admitting: Family

## 2022-05-16 LAB — IRON AND IRON BINDING CAPACITY (CC-WL,HP ONLY)
Iron: 79 ug/dL (ref 28–170)
Saturation Ratios: 32 % — ABNORMAL HIGH (ref 10.4–31.8)
TIBC: 248 ug/dL — ABNORMAL LOW (ref 250–450)
UIBC: 169 ug/dL (ref 148–442)

## 2022-05-16 NOTE — Telephone Encounter (Signed)
Discussed w/ [facility]- unable to give approval on this because Markeia Harkless is not his Pt. Will have to discuss w/ HIS PCP.

## 2022-05-17 ENCOUNTER — Other Ambulatory Visit: Payer: Self-pay | Admitting: Orthopedic Surgery

## 2022-05-17 ENCOUNTER — Telehealth: Payer: Self-pay | Admitting: Orthopedic Surgery

## 2022-05-17 MED ORDER — PREDNISONE 10 MG PO TABS
10.0000 mg | ORAL_TABLET | Freq: Every day | ORAL | 0 refills | Status: DC
Start: 2022-05-17 — End: 2022-06-23

## 2022-05-17 NOTE — Telephone Encounter (Signed)
Last filled 04/10/22 #30 for neck, shoulder pain

## 2022-05-17 NOTE — Telephone Encounter (Signed)
Pt called requesting a refill of prednisone. Please send to pharmacy on file. Pt phone number is [phone]

## 2022-05-29 ENCOUNTER — Telehealth: Payer: Self-pay | Admitting: Internal Medicine

## 2022-05-29 ENCOUNTER — Telehealth: Payer: Self-pay | Admitting: Gastroenterology

## 2022-05-29 MED ORDER — HYDROCODONE-ACETAMINOPHEN 10-325 MG PO TABS: 1.0000 | ORAL_TABLET | Freq: Four times a day (QID) | ORAL | 0 refills | Status: DC | PRN

## 2022-05-29 NOTE — Telephone Encounter (Signed)
Requesting: hydrocodone 10-'325mg'$   Contract:02/27/22 UDS:02/27/22 Last Visit:02/27/22 Next Visit: 07/03/22 Last Refill: 03/28/22 #120 and 0RF  Please Advise

## 2022-05-29 NOTE — Telephone Encounter (Signed)
PDMP ok, rx sent  

## 2022-05-29 NOTE — Telephone Encounter (Signed)
Medication: HYDROcodone-acetaminophen (NORCO/VICODIN) 5-325 MG per tablet 2 tablet   [[id number]]   Preferred Pharmacy (with phone number or street name): [facility] [address] [place], [place] [address] [phone]  Agent: Please be advised that RX refills may take up to 3 business days. We ask that you follow-up with your pharmacy.

## 2022-05-29 NOTE — Telephone Encounter (Signed)
Patients son Dellis Filbert called to follo wup on the patients next steps for treatment said he has not heard from anyone since she was referred to the Oncologist.

## 2022-05-30 NOTE — Telephone Encounter (Signed)
Left message for a return call.  Held an appointment for 07/28/22 at 1:50 pm. Patient needs afternoon appointment.

## 2022-05-30 NOTE — Telephone Encounter (Signed)
Spoke with the son of the patient. He reports the patient has responded well to her iron infusion. She is off oral iron. Stools are positive for blood. She is using the hemorrhoid treatments as ordered. Hem/Onc has referred the patient back to GI due to hem positive stools. The son is asking what needs to be done now. Is he supposed to bring the patient back for an appointment?

## 2022-05-30 NOTE — Telephone Encounter (Signed)
Please schedule office follow up visit to discuss , unfortunately she is very high risk to undergo invasive procedures. She should continue with IV iron infusions

## 2022-05-31 ENCOUNTER — Other Ambulatory Visit: Payer: Self-pay | Admitting: Internal Medicine

## 2022-05-31 DIAGNOSIS — E1152 Type 2 diabetes mellitus with diabetic peripheral angiopathy with gangrene: Secondary | ICD-10-CM | POA: Diagnosis not present

## 2022-05-31 DIAGNOSIS — I70244 Atherosclerosis of native arteries of left leg with ulceration of heel and midfoot: Secondary | ICD-10-CM | POA: Diagnosis not present

## 2022-05-31 DIAGNOSIS — I70245 Atherosclerosis of native arteries of left leg with ulceration of other part of foot: Secondary | ICD-10-CM | POA: Diagnosis not present

## 2022-06-02 NOTE — Telephone Encounter (Signed)
Spoke with Mr Eliot Ford (son). The patient will not be able to come for follow up of IDA until after 08/21/22. She will be in [place] with him until then. Appointment canceled. Will schedule her after 08/21/22 in an afternoon opening when the November schedule is released.

## 2022-06-07 DIAGNOSIS — I70244 Atherosclerosis of native arteries of left leg with ulceration of heel and midfoot: Secondary | ICD-10-CM | POA: Diagnosis not present

## 2022-06-07 DIAGNOSIS — E1152 Type 2 diabetes mellitus with diabetic peripheral angiopathy with gangrene: Secondary | ICD-10-CM | POA: Diagnosis not present

## 2022-06-07 DIAGNOSIS — I70245 Atherosclerosis of native arteries of left leg with ulceration of other part of foot: Secondary | ICD-10-CM | POA: Diagnosis not present

## 2022-06-14 DIAGNOSIS — E1152 Type 2 diabetes mellitus with diabetic peripheral angiopathy with gangrene: Secondary | ICD-10-CM | POA: Diagnosis not present

## 2022-06-14 DIAGNOSIS — I70244 Atherosclerosis of native arteries of left leg with ulceration of heel and midfoot: Secondary | ICD-10-CM | POA: Diagnosis not present

## 2022-06-21 DIAGNOSIS — I70244 Atherosclerosis of native arteries of left leg with ulceration of heel and midfoot: Secondary | ICD-10-CM | POA: Diagnosis not present

## 2022-06-23 ENCOUNTER — Telehealth: Payer: Self-pay | Admitting: Orthopedic Surgery

## 2022-06-23 ENCOUNTER — Other Ambulatory Visit: Payer: Self-pay | Admitting: Orthopedic Surgery

## 2022-06-23 MED ORDER — PREDNISONE 10 MG PO TABS: 10.0000 mg | ORAL_TABLET | Freq: Every day | ORAL | 0 refills | Status: DC

## 2022-06-23 NOTE — Telephone Encounter (Signed)
Called [facility] back, LM on VM stating that pt had gotten Rx of hydrocodone by Dr. Larose Kells her PCP on 05/29/22 #120. Advised they call his office for this refill request. This cannot be filled by two different providers due to state regulations.

## 2022-06-23 NOTE — Telephone Encounter (Signed)
Patient's son called. Patient is out of pain medication. Would like something for pain called in to [facility], [place] [address] His call back number is [phone]

## 2022-06-28 DIAGNOSIS — I70244 Atherosclerosis of native arteries of left leg with ulceration of heel and midfoot: Secondary | ICD-10-CM | POA: Diagnosis not present

## 2022-07-03 ENCOUNTER — Ambulatory Visit (INDEPENDENT_AMBULATORY_CARE_PROVIDER_SITE_OTHER): Payer: Medicare Other | Admitting: Internal Medicine

## 2022-07-03 ENCOUNTER — Encounter: Payer: Self-pay | Admitting: Internal Medicine

## 2022-07-03 VITALS — BP 132/84 | HR 83 | Temp 98.3°F | Resp 18 | Ht 64.0 in

## 2022-07-03 DIAGNOSIS — M810 Age-related osteoporosis without current pathological fracture: Secondary | ICD-10-CM

## 2022-07-03 DIAGNOSIS — I2510 Atherosclerotic heart disease of native coronary artery without angina pectoris: Secondary | ICD-10-CM | POA: Diagnosis not present

## 2022-07-03 DIAGNOSIS — D500 Iron deficiency anemia secondary to blood loss (chronic): Secondary | ICD-10-CM | POA: Diagnosis not present

## 2022-07-03 DIAGNOSIS — M1990 Unspecified osteoarthritis, unspecified site: Secondary | ICD-10-CM

## 2022-07-03 DIAGNOSIS — Z23 Encounter for immunization: Secondary | ICD-10-CM | POA: Diagnosis not present

## 2022-07-03 DIAGNOSIS — I10 Essential (primary) hypertension: Secondary | ICD-10-CM

## 2022-07-03 DIAGNOSIS — E1142 Type 2 diabetes mellitus with diabetic polyneuropathy: Secondary | ICD-10-CM

## 2022-07-03 NOTE — Progress Notes (Unsigned)
Subjective:    Patient ID: [name], female    DOB: [birth year], 87 y.o.   MRN: [id number]  DOS:  07/03/2022 Type of visit - description: Follow-up, here with her son  Since the last visit, things have been stable. Medications reviewed. Labs reviewed.  She is taking prednisone prescribed by Ortho, reason?  Perhaps is DJD. She reports that whenever she ran out of prednisone, she starts having on and off pains at the right arm. No fever chills.  Denies headaches, actually migraines have improved  Is difficult to get accurate weight but it seems stable.    Wt Readings from Last 3 Encounters:  04/11/22 162 lb (73.5 kg)  04/03/22 161 lb (73 kg)  03/27/22 161 lb (73 kg)     Review of Systems See above   Past Medical History:  Diagnosis Date   Allergic rhinitis    Arthritis    "back; ankles; hands; knees" (10/31/2013)   Bilateral sensorineural hearing loss    CAD (coronary artery disease)    a. Nonobst in 2011. b. Abnormal nuc 09/2013 -> s/p cutting balloon to D2, mild LAD disease; c. 08/2015 MV: no ischemia, EF 71%.   Carcinoma in situ in a polyp 1994   a. 1994 - malignant polyp removed during colonoscopy.   Chronic diastolic CHF (congestive heart failure) ([place])    a. 09/2013 Echo: EF 60-65%, mild LVH, Gr1 DD.   Dementia ([place])    Diverticulosis    DJD (degenerative joint disease)    Fatty liver    GERD (gastroesophageal reflux disease)    a. Hx GERD/esophageal dysmotility followed by Dr. Olevia Perches.    Hemorrhoids    Hiatal hernia    a. s/p Nissen fundoplication 5956.   Hyperlipidemia    a. patient unwilling to use statins.   Hypertensive heart disease    IBS (irritable bowel syndrome)    Macular degeneration 03/2009   Dr. Rosana Hoes   Neuropathy    a. Hands, feet, legs.   Orthostatic hypotension    Osteomyelitis ([facility])    a. 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 01/2016 s/p RLE transtibial amputation 2/2 Charcot  rocker-bottom deformity and insensate neuropathy ulceration.   Osteoporosis    PE (pulmonary embolism) 12/2007   a. PE/DVT after neck surgery 2009. b. coumadin d/c 10-2008.   PONV (postoperative nausea and vomiting) 2009   neck surgery   PPD positive    Recurrent UTI    RSD (reflex sympathetic dystrophy)    a. Chronic pain.   Spinal stenosis    Type II diabetes mellitus ([place])    no on medication   Venous insufficiency    a. Contributing to LEE.    Past Surgical History:  Procedure Laterality Date   AMPUTATION  03/06/2012   Procedure: AMPUTATION FOOT;  Surgeon: Newt Minion, MD;  Location: [facility];  Service: Orthopedics;  Laterality: Left;  FIFTH RAY AMPUTATION    AMPUTATION Right 02/18/2016   Procedure: AMPUTATION BELOW KNEE;  Surgeon: Newt Minion, MD;  Location: [facility];  Service: Orthopedics;  Laterality: Right;   AMPUTATION Left 11/22/2016   Procedure: Amputation 4th Toe Left Foot at Metatarsophalangeal Joint;  Surgeon: Newt Minion, MD;  Location: [facility];  Service: Orthopedics;  Laterality: Left;   ANKLE FUSION  09/27/2012   Procedure: ANKLE FUSION;  Surgeon: Newt Minion, MD;  Location: [facility];  Service: Orthopedics;  Laterality: Left;  Left Tibiocalcaneal Fusion  ANKLE FUSION Right 05/09/2013   Procedure: ANKLE FUSION;  Surgeon: Newt Minion, MD;  Location: [facility];  Service: Orthopedics;  Laterality: Right;  Excision Osteomyelitis Base 1st MT Right Foot, Fusion Medial Column   ANTERIOR CERVICAL DECOMP/DISCECTOMY FUSION  12/18/07   For OA,  Dr. Lorin Mercy:  fu by a PE   ANTERIOR CERVICAL DECOMP/DISCECTOMY FUSION N/A 11/29/2020   Procedure: Anterior Cervical Decompression Fusion - Cervical Three-Cerivcal Four;  Surgeon: Earnie Larsson, MD;  Location: [facility];  Service: Neurosurgery;  Laterality: N/A;  3C   CARDIAC CATHETERIZATION  05/2010    at [facility] Left 09/2011   CATARACT EXTRACTION W/ INTRAOCULAR LENS  IMPLANT, BILATERAL  2004   feb 2004 left, aug 2004 right    CHOLECYSTECTOMY  03/2002   CORONARY ANGIOPLASTY  10/31/2013   CORONARY ARTERY BYPASS GRAFT  09/27/2016   CYST REMOVAL HAND  06/2003   FOOT BONE EXCISION Right 06/2009   LAPAROSCOPIC RIGHT HEMI COLECTOMY N/A 01/08/2015   Procedure: LAPAROSCOPIC ASSISTED RIGHT HEMI COLECTOMY;  Surgeon: Johnathan Hausen, MD;  Location: [facility];  Service: General;  Laterality: N/A;   LEFT HEART CATHETERIZATION WITH CORONARY ANGIOGRAM N/A 10/31/2013   Procedure: LEFT HEART CATHETERIZATION WITH CORONARY ANGIOGRAM;  Surgeon: Jettie Booze, MD;  Location: [facility] C[facility] LAB;  Service: Cardiovascular;  Laterality: N/A;   NASAL SEPTUM SURGERY  66/2947   NISSEN FUNDOPLICATION  03/27/4649   PERCUTANEOUS CORONARY INTERVENTION-BALLOON ONLY  10/31/2013   Procedure: PERCUTANEOUS CORONARY INTERVENTION-BALLOON ONLY;  Surgeon: Jettie Booze, MD;  Location: [facility] CATH LAB;  Service: Cardiovascular;;   ROTATOR CUFF REPAIR Right 07/2007   Dr. Morene Rankins CUFF REPAIR Left 11/2004   stent in left leg, behind knee  06/27/2017   x2, done at [facility] cardiology in [place] Right 08/2008   3rd toe, Dr. Sharol Given due to osteomyelitis   VAGINAL HYSTERECTOMY  03/1975   VEIN LIGATION Bilateral 03/1966   VENA CAVA FILTER PLACEMENT  01/2010   green filter; "due to blood clots"   WISDOM TOOTH EXTRACTION  06/2007    Current Outpatient Medications  Medication Instructions   Alpha-Lipoic Acid 1,200 mg, Oral, Daily   amitriptyline (ELAVIL) 25 mg, Oral, Daily at bedtime   atorvastatin (LIPITOR) 40 mg, Oral, Daily   carvedilol (COREG) 12.5 mg, Oral, 2 times daily with meals   CINNAMON PO 2,000 mg, Oral, Daily   clopidogrel (PLAVIX) 75 mg, Oral, Daily at bedtime, Restart on 12/04/2020   dextromethorphan-guaiFENesin (TUSSIN DM) 10-100 MG/5ML liquid Oral, Every 4 hours PRN   diclofenac Sodium (VOLTAREN) 4 g, Topical, 4 times daily   dicyclomine (BENTYL) 10 MG capsule TAKE 1 CAPSULE THREE TIMES A DAY BEFORE MEALS (NEED APPOINTMENT)    donepezil (ARICEPT) 10 MG tablet TAKE 1 TABLET AT BEDTIME   Emgality 120 mg, Subcutaneous, Every 30 days   famotidine (PEPCID) 20 mg, Oral, 2 times daily   ferrous sulfate (FEROSUL) 325 mg, Oral, 2 times daily with meals   furosemide (LASIX) 20 mg, Oral, Daily   glucose blood (FREESTYLE LITE) test strip USE TO CHECK BLOOD SUGAR NO MORE THAN TWICE DAILY   HYDROcodone-acetaminophen (NORCO) 10-325 MG tablet 1 tablet, Oral, Every 6 hours PRN   hydrocortisone (ANUSOL-HC) 2.5 % rectal cream 1 application , Rectal, 2 times daily, As needed   hydrocortisone (ANUSOL-HC) 25 mg, Rectal, 2 times daily, As needed   ketoconazole (NIZORAL) 2 % cream 1 application , Topical, Daily  Lancets (FREESTYLE) lancets CHECK BLOOD SUGAR TWICE DAILY   linaclotide (LINZESS) 72 mcg, Oral, Daily before breakfast   loperamide (IMODIUM A-D) 2 mg, Oral, As needed   MegaRed Omega-3 Krill Oil 500 MG CAPS Restart on 12/04/2020   methenamine (HIPREX) 1 g tablet 0.5 tablets, Oral, Daily   Multiple Vitamins-Minerals (PRESERVISION AREDS 2) CAPS 1 capsule, Oral, 2 times daily   naloxone (NARCAN) nasal spray 4 mg/0.1 mL 1 spray, Nasal, Once PRN   nitroGLYCERIN (NITROSTAT) 0.4 mg, Sublingual, Every 5 min x3 PRN   omeprazole (PRILOSEC) 20 mg, Oral, 2 times daily before meals   ondansetron (ZOFRAN) 4 MG tablet TAKE 1 TABLET(4 MG) BY MOUTH EVERY 8 HOURS AS NEEDED FOR NAUSEA OR VOMITING   OVER THE COUNTER MEDICATION 1 tablet, 3 times daily, Berberorine Gluco Defense    Polyvinyl Alcohol-Povidone (REFRESH OP) 1 drop, Both Eyes, 3 times daily PRN   pramipexole (MIRAPEX) 0.125 MG tablet TAKE 1 TO 2 TABLETS(0.125 TO 0.25 MG) BY MOUTH AT BEDTIME AS NEEDED   predniSONE (DELTASONE) 10 mg, Oral, Daily with breakfast   Probiotic Product (PROBIOTIC PEARLS PO) 1 tablet, Oral, Daily   rivaroxaban (XARELTO) 2.5 mg, Oral, 2 times daily, Restart on 12/04/2020   silver sulfADIAZINE (SILVADENE) 1 % cream 1 application , Topical, Daily   Vitamin B-12  5,000 mcg, Sublingual, Daily   vitamin C with rose hips 500 mg, Oral, 2 times daily   Vitamin D3 5,000 Units, Oral, Every evening, Reported on 11/24/2015   vitamin E 400 Units, Oral, Daily       Objective:   Physical Exam BP 132/84   Pulse 83   Temp 98.3 F (36.8 C) (Oral)   Resp 18   Ht '5\' 4"'$  (1.626 m)   SpO2 94%   BMI 27.81 kg/m  General:   Well developed, NAD, BMI noted. HEENT:  Normocephalic . Face symmetric, atraumatic Lungs:  CTA B Normal respiratory effort, no intercostal retractions, no accessory muscle use. Heart: RRR,  no murmur.  Lower extremities: Trace pretibial edema R leg. Skin: Not pale. Not jaundice Neurologic:  alert & oriented X3.  Speech normal, gait not tested. Psych--  Cognition and judgment appear intact.  Cooperative with normal attention span and concentration.  Behavior appropriate. No anxious or depressed appearing.      Assessment     ASSESSMENT DM, Neuropathy, amputations due to Charcot feet. - metformin intolerant  (lethargic) HTN  Hyperlipidemia LUNG MASS -incidental per CT 2009,  PET scan 01-2008: likely  benign, CT 08-2009 no change, no further CTs per Dr Gwenette Greet - Had a CT in [place] 09-2016 "spiculated mass", saw Dr Lamonte Sakai,  CT  09/2017 stable CV: ---CAD cardiologist in [facility] --NSTEMI 09-2016. Outside hospital: Cath 2 vessel disease, CABG 2, LIMA to LAD and Diag   ---Chronic diastolic CHF ---PVD: Aortogram and a stent L Leg Dr Feliberto Gottron, 06-27-2017 @ [facility] (per pt) GI: GERD, IBS, colon polyps, PUD, abdominal pain "post polypectomy syndrome" naueas meds prn (antivert-zofran, gets nausea when car traveling) Osteoporosis  MSK,pain mngmt : --Reflex sympathetic dystrophy --Neuropathy --DJD --Spinal stenosis  --H/o Osteomyelitis  Foot --s/p amputation:  4th 5th L toes , R BKA  (01-2016, dx osteomyelitis) w/ phantom pain  -- sees Dr Sharol Given prn She took oxycodone after surgery and that did NOT seem to help better than  hydrocodone. Intolerant to Cymbalta, Neurontin ; Lyrica helped but caused edema. Recurrent UTIs: Dr Junious Silk  Venous insufficiency  H/o  + PPD H/o hypothyroidism: TSHs stable  H/o dyspnea   PLAN:   DM, last A1c 5.1, recheck A1c, started prednisone June 2023 prescribed by Ortho. HTN: BP is very good.  Continue carvedilol, Lasix, check a BMP, last creatinine slightly up High cholesterol: Last LDL 31.  Continue atorvastatin  IDA, mild thrombocytopenia: Follow-up by hematology, anemia felt to be due to GI losses, next steps per GI. CAD: Saw cardiology May 2023, was Rx to continue Plavix and Xarelto.  No need for aspirin. Osteoporosis: Last T score -2.8, on Prolia, last injection Mar 22, 2022 next November 2023. Pain management: Hydrocodone >  average BID,occ TID.  Patient's son is very attentive and he make sure she does not get more acetaminophen than 3000 mg daily.    DJD: On prednisone 10 mg prescribed by Ortho 03/2022.  Rx for upper extremity pain R.  No fever or chills, no headaches.  Check a sed rate, advised son to try to decrease dose to 5 mg daily. Migraines: Not taking Emgality.  No recent headaches. Preventive care: PNM 20 today. RTC 4 months  5 DM: Diet controlled, check A1c HTN: BP is very good, recommend to check at home, continue Carvedilol, Lasix, check a CMP and CBC Hyperlipidemia: On atorvastatin, checking labs PVD: Dr Feliberto Gottron [facility]), left leg procedure 02/14/2022 "to clean the arteries", to have another precedure soon. She also has a left heel ulcer, was seen at the orthopedic office 12/27/2021 but they decided to transfer care of the wound to Dr. Feliberto Gottron because the area seems to be healing well better since they "cleaned the arteries". Rash, abdomen: Likely fungal, sent  Nizoral, use it twice daily, keep the area clean and dry once better. Pressure ulcer: Between the buttocks, recommend frequent turning, Desitin cream. Facial paresthesia: Observation for  now. Dementia: Son reports that she is doing great, things have stabilized.  Continue Aricept CAD: saw cards recently, no documentation, cont.  Plavix, Xarelto. Pain management: On average takes 2 hydrocodones daily.  Contract and UDS today.   Constipation, saw GI 10-2021. RTC 4 m

## 2022-07-03 NOTE — Patient Instructions (Addendum)
Recommend to proceed with covid booster (bivalent).  Check the  blood pressure regularly BP GOAL is between 110/65 and  135/85. If it is consistently higher or lower, let me know  Try to cut down prednisone 10 mg daily to 5 mg daily   GO TO THE LAB : Get the blood work     [place], [place] back for   a checkup in 4 months

## 2022-07-04 LAB — BASIC METABOLIC PANEL
BUN: 17 mg/dL (ref 6–23)
CO2: 21 mEq/L (ref 19–32)
Calcium: 7.9 mg/dL — ABNORMAL LOW (ref 8.4–10.5)
Chloride: 100 mEq/L (ref 96–112)
Creatinine, Ser: 1.15 mg/dL (ref 0.40–1.20)
GFR: 43.58 mL/min — ABNORMAL LOW (ref >60.00)
Glucose, Bld: 136 mg/dL — ABNORMAL HIGH (ref 70–99)
Potassium: 4.3 mEq/L (ref 3.5–5.1)
Sodium: 130 mEq/L — ABNORMAL LOW (ref 135–145)

## 2022-07-04 LAB — HEMOGLOBIN A1C: Hgb A1c MFr Bld: 5.4 % (ref 4.6–6.5)

## 2022-07-04 LAB — SEDIMENTATION RATE: Sed Rate: 1 mm/hr (ref 0–30)

## 2022-07-04 NOTE — Assessment & Plan Note (Signed)
ROV DM, last A1c 5.1, diet controlled, recheck A1c.  Started prednisone June 2023 prescribed by Ortho. HTN: BP is very good.  Continue carvedilol, Lasix, check a BMP, last creatinine slightly up High cholesterol: Last LDL 31.  Continue atorvastatin IDA, mild thrombocytopenia: Follow-up by hematology, anemia felt to be due to GI losses, next steps per GI. CAD: Saw cardiology May 2023, was Rx to continue Plavix and Xarelto.  No need for aspirin. Osteoporosis: Last T score -2.8, on Prolia, last injection Mar 22, 2022 next November 2023. Pain management: Hydrocodone intake  average BID,occ TID.  Patient's son is very attentive and he make sure she does not get  acetaminophen more than 3000 mg daily.    DJD: On prednisone 10 mg prescribed by Ortho 03/2022.  Rx for upper extremity pain R.  No fever or chills, no headaches.  Check a sed rate, advised son to try to decrease dose to 5 mg daily. Migraines: Not taking Emgality.  No recent headaches. Preventive care: PNM 20 today. RTC 4 months

## 2022-07-05 DIAGNOSIS — I70245 Atherosclerosis of native arteries of left leg with ulceration of other part of foot: Secondary | ICD-10-CM | POA: Diagnosis not present

## 2022-07-05 NOTE — Addendum Note (Signed)
Addended byDamita Dunnings D on: 07/05/2022 07:55 AM   Modules accepted: Orders

## 2022-07-10 ENCOUNTER — Other Ambulatory Visit: Payer: Self-pay | Admitting: Internal Medicine

## 2022-07-14 DIAGNOSIS — I70244 Atherosclerosis of native arteries of left leg with ulceration of heel and midfoot: Secondary | ICD-10-CM | POA: Diagnosis not present

## 2022-07-14 DIAGNOSIS — E1152 Type 2 diabetes mellitus with diabetic peripheral angiopathy with gangrene: Secondary | ICD-10-CM | POA: Diagnosis not present

## 2022-07-14 DIAGNOSIS — I70213 Atherosclerosis of native arteries of extremities with intermittent claudication, bilateral legs: Secondary | ICD-10-CM | POA: Diagnosis not present

## 2022-07-14 DIAGNOSIS — E785 Hyperlipidemia, unspecified: Secondary | ICD-10-CM | POA: Diagnosis not present

## 2022-07-17 DIAGNOSIS — I25728 Atherosclerosis of autologous artery coronary artery bypass graft(s) with other forms of angina pectoris: Secondary | ICD-10-CM | POA: Diagnosis not present

## 2022-07-17 DIAGNOSIS — E782 Mixed hyperlipidemia: Secondary | ICD-10-CM | POA: Diagnosis not present

## 2022-07-20 DIAGNOSIS — I70245 Atherosclerosis of native arteries of left leg with ulceration of other part of foot: Secondary | ICD-10-CM | POA: Diagnosis not present

## 2022-07-20 DIAGNOSIS — E785 Hyperlipidemia, unspecified: Secondary | ICD-10-CM | POA: Diagnosis not present

## 2022-07-20 DIAGNOSIS — I70244 Atherosclerosis of native arteries of left leg with ulceration of heel and midfoot: Secondary | ICD-10-CM | POA: Diagnosis not present

## 2022-07-20 DIAGNOSIS — I10 Essential (primary) hypertension: Secondary | ICD-10-CM | POA: Diagnosis not present

## 2022-07-20 DIAGNOSIS — I7025 Atherosclerosis of native arteries of other extremities with ulceration: Secondary | ICD-10-CM | POA: Diagnosis not present

## 2022-07-20 DIAGNOSIS — I70213 Atherosclerosis of native arteries of extremities with intermittent claudication, bilateral legs: Secondary | ICD-10-CM | POA: Diagnosis not present

## 2022-07-20 DIAGNOSIS — E1152 Type 2 diabetes mellitus with diabetic peripheral angiopathy with gangrene: Secondary | ICD-10-CM | POA: Diagnosis not present

## 2022-07-26 ENCOUNTER — Telehealth: Payer: Self-pay | Admitting: Internal Medicine

## 2022-07-26 DIAGNOSIS — I10 Essential (primary) hypertension: Secondary | ICD-10-CM | POA: Diagnosis not present

## 2022-07-26 DIAGNOSIS — I739 Peripheral vascular disease, unspecified: Secondary | ICD-10-CM | POA: Diagnosis not present

## 2022-07-26 NOTE — Telephone Encounter (Signed)
Requesting: hydrocodone 10-'325mg'$  Contract:02/27/22 UDS:02/27/22 Last Visit: 07/03/22 Next Visit: 08/04/22 Last Refill: 05/29/22 #120 and 0RF   Please Advise

## 2022-07-26 NOTE — Telephone Encounter (Signed)
Pt would like to know when they are due for prolia as well.   Medication: HYDROcodone-acetaminophen (NORCO) 10-325 MG tablet  Has the patient contacted their pharmacy? No.   Preferred Pharmacy:  [facility] [place], [place] [address] [facility] [address] [place] [address] Phone: [phone]  Fax: [phone]

## 2022-07-27 MED ORDER — HYDROCODONE-ACETAMINOPHEN 10-325 MG PO TABS: 1.0000 | ORAL_TABLET | Freq: Four times a day (QID) | ORAL | 0 refills | Status: DC | PRN

## 2022-07-27 NOTE — Telephone Encounter (Signed)
PDMP review, prescription sent 

## 2022-07-28 ENCOUNTER — Ambulatory Visit: Payer: Medicare Other | Admitting: Gastroenterology

## 2022-07-31 ENCOUNTER — Other Ambulatory Visit (INDEPENDENT_AMBULATORY_CARE_PROVIDER_SITE_OTHER): Payer: Medicare Other

## 2022-07-31 ENCOUNTER — Ambulatory Visit: Payer: Medicare Other | Admitting: Internal Medicine

## 2022-07-31 DIAGNOSIS — I10 Essential (primary) hypertension: Secondary | ICD-10-CM

## 2022-07-31 LAB — BASIC METABOLIC PANEL
BUN: 18 mg/dL (ref 6–23)
CO2: 22 mEq/L (ref 19–32)
Calcium: 7.2 mg/dL — ABNORMAL LOW (ref 8.4–10.5)
Chloride: 98 mEq/L (ref 96–112)
Creatinine, Ser: 0.87 mg/dL (ref 0.40–1.20)
GFR: 60.88 mL/min (ref >60.00)
Glucose, Bld: 73 mg/dL (ref 70–99)
Potassium: 4.7 mEq/L (ref 3.5–5.1)
Sodium: 129 mEq/L — ABNORMAL LOW (ref 135–145)

## 2022-08-01 ENCOUNTER — Telehealth: Payer: Self-pay | Admitting: Orthopedic Surgery

## 2022-08-01 NOTE — Telephone Encounter (Signed)
Pt wants prednison 10 mg refilled sent to [facility] on [address]

## 2022-08-02 MED ORDER — PREDNISONE 10 MG PO TABS: 10.0000 mg | ORAL_TABLET | Freq: Every day | ORAL | 0 refills | Status: DC

## 2022-08-04 ENCOUNTER — Ambulatory Visit (INDEPENDENT_AMBULATORY_CARE_PROVIDER_SITE_OTHER): Payer: Medicare Other | Admitting: Internal Medicine

## 2022-08-04 ENCOUNTER — Encounter: Payer: Self-pay | Admitting: Internal Medicine

## 2022-08-04 VITALS — BP 136/88 | HR 75 | Temp 98.0°F | Resp 16 | Ht 64.0 in

## 2022-08-04 DIAGNOSIS — M353 Polymyalgia rheumatica: Secondary | ICD-10-CM | POA: Diagnosis not present

## 2022-08-04 DIAGNOSIS — R52 Pain, unspecified: Secondary | ICD-10-CM

## 2022-08-04 DIAGNOSIS — M1990 Unspecified osteoarthritis, unspecified site: Secondary | ICD-10-CM | POA: Diagnosis not present

## 2022-08-04 DIAGNOSIS — I2510 Atherosclerotic heart disease of native coronary artery without angina pectoris: Secondary | ICD-10-CM | POA: Diagnosis not present

## 2022-08-04 DIAGNOSIS — H9193 Unspecified hearing loss, bilateral: Secondary | ICD-10-CM

## 2022-08-04 NOTE — Progress Notes (Unsigned)
Subjective:    Patient ID: [name], female    DOB: [birth year], 87 y.o.   MRN: [id number]  DOS:  08/04/2022 Type of visit - description: Acute, here with her son  They raised several concerns. Complaining of bilateral hand pain whenever he tries to use her hands.  The pain is mostly at the abdominal area. Does not report cramps.  Is her son's observations that aches and pains decreased when she takes prednisone 10 mg, particularly around the shoulders. She is trying to wean her off and when they go to 5 mg of prednisone the pain is slightly worse.  No fevers that they know. No recent visual disturbances   Review of Systems See above   Past Medical History:  Diagnosis Date   Allergic rhinitis    Arthritis    "back; ankles; hands; knees" (10/31/2013)   Bilateral sensorineural hearing loss    CAD (coronary artery disease)    a. Nonobst in 2011. b. Abnormal nuc 09/2013 -> s/p cutting balloon to D2, mild LAD disease; c. 08/2015 MV: no ischemia, EF 71%.   Carcinoma in situ in a polyp 1994   a. 1994 - malignant polyp removed during colonoscopy.   Chronic diastolic CHF (congestive heart failure) ([place])    a. 09/2013 Echo: EF 60-65%, mild LVH, Gr1 DD.   Dementia ([place])    Diverticulosis    DJD (degenerative joint disease)    Fatty liver    GERD (gastroesophageal reflux disease)    a. Hx GERD/esophageal dysmotility followed by Dr. Olevia Perches.    Hemorrhoids    Hiatal hernia    a. s/p Nissen fundoplication 4268.   Hyperlipidemia    a. patient unwilling to use statins.   Hypertensive heart disease    IBS (irritable bowel syndrome)    Macular degeneration 03/2009   Dr. Rosana Hoes   Neuropathy    a. Hands, feet, legs.   Orthostatic hypotension    Osteomyelitis (HCC)    a. 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 01/2016 s/p RLE transtibial amputation 2/2 Charcot rocker-bottom deformity and insensate neuropathy ulceration.    Osteoporosis    PE (pulmonary embolism) 12/2007   a. PE/DVT after neck surgery 2009. b. coumadin d/c 10-2008.   PONV (postoperative nausea and vomiting) 2009   neck surgery   PPD positive    Recurrent UTI    RSD (reflex sympathetic dystrophy)    a. Chronic pain.   Spinal stenosis    Type II diabetes mellitus ([place])    no on medication   Venous insufficiency    a. Contributing to LEE.    Past Surgical History:  Procedure Laterality Date   AMPUTATION  03/06/2012   Procedure: AMPUTATION FOOT;  Surgeon: Newt Minion, MD;  Location: [facility];  Service: Orthopedics;  Laterality: Left;  FIFTH RAY AMPUTATION    AMPUTATION Right 02/18/2016   Procedure: AMPUTATION BELOW KNEE;  Surgeon: Newt Minion, MD;  Location: [facility];  Service: Orthopedics;  Laterality: Right;   AMPUTATION Left 11/22/2016   Procedure: Amputation 4th Toe Left Foot at Metatarsophalangeal Joint;  Surgeon: Newt Minion, MD;  Location: [facility];  Service: Orthopedics;  Laterality: Left;   ANKLE FUSION  09/27/2012   Procedure: ANKLE FUSION;  Surgeon: Newt Minion, MD;  Location: [facility];  Service: Orthopedics;  Laterality: Left;  Left Tibiocalcaneal Fusion   ANKLE FUSION Right 05/09/2013   Procedure: ANKLE FUSION;  Surgeon: Illene Regulus  Sharol Given, MD;  Location: [facility];  Service: Orthopedics;  Laterality: Right;  Excision Osteomyelitis Base 1st MT Right Foot, Fusion Medial Column   ANTERIOR CERVICAL DECOMP/DISCECTOMY FUSION  12/18/07   For OA,  Dr. Lorin Mercy:  fu by a PE   ANTERIOR CERVICAL DECOMP/DISCECTOMY FUSION N/A 11/29/2020   Procedure: Anterior Cervical Decompression Fusion - Cervical Three-Cerivcal Four;  Surgeon: Earnie Larsson, MD;  Location: [facility];  Service: Neurosurgery;  Laterality: N/A;  3C   CARDIAC CATHETERIZATION  05/2010    at [facility] Left 09/2011   CATARACT EXTRACTION W/ INTRAOCULAR LENS  IMPLANT, BILATERAL  2004   feb 2004 left, aug 2004 right   CHOLECYSTECTOMY  03/2002   CORONARY ANGIOPLASTY  10/31/2013    CORONARY ARTERY BYPASS GRAFT  09/27/2016   CYST REMOVAL HAND  06/2003   FOOT BONE EXCISION Right 06/2009   LAPAROSCOPIC RIGHT HEMI COLECTOMY N/A 01/08/2015   Procedure: LAPAROSCOPIC ASSISTED RIGHT HEMI COLECTOMY;  Surgeon: Johnathan Hausen, MD;  Location: [facility];  Service: General;  Laterality: N/A;   LEFT HEART CATHETERIZATION WITH CORONARY ANGIOGRAM N/A 10/31/2013   Procedure: LEFT HEART CATHETERIZATION WITH CORONARY ANGIOGRAM;  Surgeon: Jettie Booze, MD;  Location: [facility] C[facility] LAB;  Service: Cardiovascular;  Laterality: N/A;   NASAL SEPTUM SURGERY  69/4854   NISSEN FUNDOPLICATION  03/24/7034   PERCUTANEOUS CORONARY INTERVENTION-BALLOON ONLY  10/31/2013   Procedure: PERCUTANEOUS CORONARY INTERVENTION-BALLOON ONLY;  Surgeon: Jettie Booze, MD;  Location: [facility] CATH LAB;  Service: Cardiovascular;;   ROTATOR CUFF REPAIR Right 07/2007   Dr. Morene Rankins CUFF REPAIR Left 11/2004   stent in left leg, behind knee  06/27/2017   x2, done at [facility] cardiology in [place] Right 08/2008   3rd toe, Dr. Sharol Given due to osteomyelitis   VAGINAL HYSTERECTOMY  03/1975   VEIN LIGATION Bilateral 03/1966   VENA CAVA FILTER PLACEMENT  01/2010   green filter; "due to blood clots"   WISDOM TOOTH EXTRACTION  06/2007    Current Outpatient Medications  Medication Instructions   Alpha-Lipoic Acid 1,200 mg, Oral, Daily   amitriptyline (ELAVIL) 25 mg, Oral, Daily at bedtime   atorvastatin (LIPITOR) 40 mg, Oral, Daily   carvedilol (COREG) 12.5 mg, Oral, 2 times daily with meals   CINNAMON PO 2,000 mg, Oral, Daily   clopidogrel (PLAVIX) 75 mg, Oral, Daily at bedtime, Restart on 12/04/2020   dextromethorphan-guaiFENesin (TUSSIN DM) 10-100 MG/5ML liquid Oral, Every 4 hours PRN   diclofenac Sodium (VOLTAREN) 4 g, Topical, 4 times daily   dicyclomine (BENTYL) 10 MG capsule TAKE 1 CAPSULE THREE TIMES A DAY BEFORE MEALS (NEED APPOINTMENT)   donepezil (ARICEPT) 10 MG tablet TAKE 1 TABLET AT BEDTIME    Emgality 120 mg, Subcutaneous, Every 30 days   famotidine (PEPCID) 20 mg, Oral, 2 times daily   furosemide (LASIX) 20 mg, Oral, Daily   glucose blood (FREESTYLE LITE) test strip USE TO CHECK BLOOD SUGAR NO MORE THAN TWICE DAILY   HYDROcodone-acetaminophen (NORCO) 10-325 MG tablet 1 tablet, Oral, Every 6 hours PRN   hydrocortisone (ANUSOL-HC) 2.5 % rectal cream 1 application , Rectal, 2 times daily, As needed   hydrocortisone (ANUSOL-HC) 25 mg, Rectal, 2 times daily, As needed   ketoconazole (NIZORAL) 2 % cream 1 application , Topical, Daily   Lancets (FREESTYLE) lancets CHECK BLOOD SUGAR TWICE DAILY   linaclotide (LINZESS) 72 mcg, Oral, Daily before breakfast   loperamide (IMODIUM A-D) 2 mg,  Oral, As needed   MegaRed Omega-3 Krill Oil 500 MG CAPS Restart on 12/04/2020   methenamine (HIPREX) 1 g tablet 0.5 tablets, Oral, Daily   Multiple Vitamins-Minerals (PRESERVISION AREDS 2) CAPS 1 capsule, Oral, 2 times daily   naloxone (NARCAN) nasal spray 4 mg/0.1 mL 1 spray, Nasal, Once PRN   nitroGLYCERIN (NITROSTAT) 0.4 mg, Sublingual, Every 5 min x3 PRN   omeprazole (PRILOSEC) 20 mg, Oral, 2 times daily before meals   ondansetron (ZOFRAN) 4 MG tablet TAKE 1 TABLET(4 MG) BY MOUTH EVERY 8 HOURS AS NEEDED FOR NAUSEA OR VOMITING   OVER THE COUNTER MEDICATION 1 tablet, 3 times daily, Berberorine Gluco Defense    Polyvinyl Alcohol-Povidone (REFRESH OP) 1 drop, Both Eyes, 3 times daily PRN   pramipexole (MIRAPEX) 0.125-0.25 mg, Oral, At bedtime PRN   predniSONE (DELTASONE) 10 mg, Oral, Daily with breakfast   Probiotic Product (PROBIOTIC PEARLS PO) 1 tablet, Oral, Daily   rivaroxaban (XARELTO) 2.5 mg, Oral, 2 times daily, Restart on 12/04/2020   silver sulfADIAZINE (SILVADENE) 1 % cream 1 application , Topical, Daily   Vitamin B-12 5,000 mcg, Sublingual, Daily   vitamin C with rose hips 500 mg, Oral, 2 times daily   Vitamin D3 5,000 Units, Every evening   vitamin E 400 Units, Oral, Daily        Objective:   Physical Exam BP 136/88   Pulse 75   Temp 98 F (36.7 C) (Oral)   Resp 16   Ht '5\' 4"'$  (1.626 m)   SpO2 97%   BMI 27.81 kg/m  General:   Well developed, NAD, BMI noted. HEENT:  Normocephalic . Face symmetric, atraumatic Temples: Palpation of the temples does not elicit any pain.  Present TA pulses. Hands: Upon palpation, there is no synovitis.  No fibrotic bands.  Fingers are well-perfused. Skin: Not pale. Not jaundice Neurologic:  alert & oriented X3.  Speech normal, gait appropriate for age and unassisted Psych--  Cognition and judgment appear intact.  Cooperative with normal attention span and concentration.  Behavior appropriate. No anxious or depressed appearing.      Assessment     ASSESSMENT DM, Neuropathy, amputations due to Charcot feet. - metformin intolerant  (lethargic) HTN  Hyperlipidemia LUNG MASS -incidental per CT 2009,  PET scan 01-2008: likely  benign, CT 08-2009 no change, no further CTs per Dr Gwenette Greet - Had a CT in [place] 09-2016 "spiculated mass", saw Dr Lamonte Sakai,  CT  09/2017 stable CV: ---CAD cardiologist in [facility] --NSTEMI 09-2016. Outside hospital: Cath 2 vessel disease, CABG 2, LIMA to LAD and Diag   ---Chronic diastolic CHF ---PVD: Aortogram and a stent L Leg Dr Feliberto Gottron, 06-27-2017 @ [place] (per pt) GI: GERD, IBS, colon polyps, PUD, abdominal pain "post polypectomy syndrome" naueas meds prn (antivert-zofran, gets nausea when car traveling) Osteoporosis  MSK,pain mngmt : --Reflex sympathetic dystrophy --Neuropathy --DJD --Spinal stenosis  --H/o Osteomyelitis  Foot --s/p amputation:  4th 5th L toes , R BKA  (01-2016, dx osteomyelitis) w/ phantom pain  -- sees Dr Sharol Given prn She took oxycodone after surgery and that did NOT seem to help better than hydrocodone. Intolerant to Cymbalta, Neurontin ; Lyrica helped but caused edema. Recurrent UTIs: Dr Junious Silk  Venous insufficiency  H/o  + PPD H/o hypothyroidism: TSHs stable H/o  dyspnea   PLAN:  DJD: Main concern today is pain at the hands.  Denies any cramping. Also, she takes chronic prednisone prescribed by orthopedics and is their  observation that pain is  more intense around the shoulders when prednisone dose is decreased from 10 mg to 5 mg. Etiology of the hand pain is not completely clear. Due to response to prednisone, PMR is in the DDx whenever recent sed rate was only 1.0. I believe will be very difficult to rule out PMR given all her previous medical problems and the fact that she is taking prednisone for the last 4 months which can skew with a sed rate results  but nevertheless were ask our rheumatologist to see the patient.  Pain management: Taking hydrocodone twice daily most days, occasionally either take third hydrocodone or 2 Tylenols.  She is not going over the acetaminophen level. Encouraged to continue present care, also Voltaren gel, I am not opposed for her to try CBD oil. HOH?  Request a audiology referral Hypocalcemia: Probably related to diuretics, check ionized calcium Social: Lives with her son, splits her time between [place] and [place].

## 2022-08-04 NOTE — Patient Instructions (Addendum)
Get your blood work before you leave  Continue the same medications.  Try to use the Voltaren gel on your hands frequently  Try to minimize the use of prednisone to 5 mg daily.  I am not opposed for you to try CBD oil.  It has been very good to some of my patients  Vaccines I recommend:  Covid booster RSV vaccine

## 2022-08-06 LAB — CALCIUM, IONIZED: Calcium, Ion: 4.5 mg/dL — ABNORMAL LOW (ref 4.7–5.5)

## 2022-08-06 NOTE — Assessment & Plan Note (Signed)
DJD: Main concern today is pain at the hands.  Denies any cramping. Also, she takes chronic prednisone prescribed by orthopedics and is their observation that pain is more intense around the shoulders when prednisone dose is decreased from 10 mg to 5 mg. Etiology of the hand pain is not completely clear. Due to response to prednisone, PMR is in the DDx however recent sed rate was only 1.0. I believe will be very difficult to rule out PMR given all her previous medical problems and the fact that she is taking prednisone for the last 4 months which can skew with a sed rate results  but nevertheless will ask  rheumatologist to see the patient >> R/O PMR Pain management: Taking hydrocodone twice daily most days, occasionally either take third hydrocodone or 2 Tylenols.  She is not going over the safe  acetaminophen level. Encouraged to continue present care, also Voltaren gel, I am not opposed for her to try CBD oil. HOH?  Request a audiology referral Hypocalcemia: Probably related to diuretics, check ionized calcium Social: Lives with her son, splits her time between [place] and [place].

## 2022-08-08 DIAGNOSIS — I70213 Atherosclerosis of native arteries of extremities with intermittent claudication, bilateral legs: Secondary | ICD-10-CM | POA: Diagnosis not present

## 2022-08-18 DIAGNOSIS — I70213 Atherosclerosis of native arteries of extremities with intermittent claudication, bilateral legs: Secondary | ICD-10-CM | POA: Diagnosis not present

## 2022-08-21 ENCOUNTER — Encounter: Payer: Self-pay | Admitting: Family

## 2022-08-21 ENCOUNTER — Inpatient Hospital Stay: Payer: Medicare Other | Attending: Family

## 2022-08-21 ENCOUNTER — Inpatient Hospital Stay (HOSPITAL_BASED_OUTPATIENT_CLINIC_OR_DEPARTMENT_OTHER): Payer: Medicare Other | Admitting: Family

## 2022-08-21 VITALS — BP 117/57 | HR 80 | Temp 98.3°F | Resp 17 | Wt 177.0 lb

## 2022-08-21 DIAGNOSIS — R5383 Other fatigue: Secondary | ICD-10-CM | POA: Diagnosis not present

## 2022-08-21 DIAGNOSIS — D509 Iron deficiency anemia, unspecified: Secondary | ICD-10-CM

## 2022-08-21 DIAGNOSIS — D696 Thrombocytopenia, unspecified: Secondary | ICD-10-CM | POA: Diagnosis not present

## 2022-08-21 DIAGNOSIS — M7989 Other specified soft tissue disorders: Secondary | ICD-10-CM | POA: Insufficient documentation

## 2022-08-21 LAB — CBC WITH DIFFERENTIAL (CANCER CENTER ONLY)
Abs Immature Granulocytes: 0.01 10*3/uL (ref 0.00–0.07)
Basophils Absolute: 0 10*3/uL (ref 0.0–0.1)
Basophils Relative: 0 %
Eosinophils Absolute: 0 10*3/uL (ref 0.0–0.5)
Eosinophils Relative: 0 %
HCT: 33.5 % — ABNORMAL LOW (ref 36.0–46.0)
Hemoglobin: 10.8 g/dL — ABNORMAL LOW (ref 12.0–15.0)
Immature Granulocytes: 0 %
Lymphocytes Relative: 18 %
Lymphs Abs: 0.9 10*3/uL (ref 0.7–4.0)
MCH: 32.1 pg (ref 26.0–34.0)
MCHC: 32.2 g/dL (ref 30.0–36.0)
MCV: 99.7 fL (ref 80.0–100.0)
Monocytes Absolute: 0.3 10*3/uL (ref 0.1–1.0)
Monocytes Relative: 5 %
Neutro Abs: 3.9 10*3/uL (ref 1.7–7.7)
Neutrophils Relative %: 77 %
Platelet Count: 134 10*3/uL — ABNORMAL LOW (ref 150–400)
RBC: 3.36 MIL/uL — ABNORMAL LOW (ref 3.87–5.11)
RDW: 12.9 % (ref 11.5–15.5)
WBC Count: 5.1 10*3/uL (ref 4.0–10.5)
nRBC: 0 % (ref 0.0–0.2)

## 2022-08-21 LAB — RETICULOCYTES
Immature Retic Fract: 14.6 % (ref 2.3–15.9)
RBC.: 3.35 MIL/uL — ABNORMAL LOW (ref 3.87–5.11)
Retic Count, Absolute: 60.3 10*3/uL (ref 19.0–186.0)
Retic Ct Pct: 1.8 % (ref 0.4–3.1)

## 2022-08-21 LAB — FERRITIN: Ferritin: 28 ng/mL (ref 11–307)

## 2022-08-21 NOTE — Progress Notes (Signed)
Hematology and Oncology Follow Up Visit  [name] [id number] 03/19/1935 87 y.o. 08/21/2022   Principle Diagnosis:  Iron deficiency anemia  Mild thrombocytopenia    Current Therapy:        IV iron as indicated    Interim History:  [name] is here today with her son for follow-up. She is symptomatic with fatigue and states that she is deconditioned from beng chair bound while a wound on her left foot was healing. Her surgeon now has her in a brace and she has been cleared to start walking.  She has some mild swelling in the left lower extremity with the brace.  No numbness or tingling in her extremities at this time.   No falls or syncope to report. She is using a Rolator at home when ambulating for added support.  No blood loss noted. No bruising or petechiae.  No fever, chills, n/v, cough, rash, dizziness, SOB, chest pain, palpitations, abdominal pain or changes in bowel or bladder habits.  Appetite and hydration have been good. Weight is 177 lbs.   ECOG Performance Status: 1 - Symptomatic but completely ambulatory  Medications:  Allergies as of 08/21/2022       Reactions   Metformin And Related Diarrhea, Nausea Only, Other (See Comments)   dizzy, tired, chills   Cymbalta [duloxetine Hcl] Swelling   Swelling in legs   Gabapentin Swelling   Swelling in legs   Silicone Hives, Itching, Dermatitis, Rash   Tramadol Swelling   Adhesive [tape] Rash   Cefazolin Hives      Ciprofloxacin Other (See Comments)   body aches   Clorazepate Dipotassium Other (See Comments)   Unknown reaction   Dilaudid [hydromorphone Hcl] Itching    confused,   Doxycycline Rash   Enablex [darifenacin Hydrobromide Er] Other (See Comments)   Hypotension, near syncope   Levofloxacin Other (See Comments)   Causes wrist pain   Lyrica [pregabalin] Swelling   Swelling in legs   Methadone Hcl Other (See Comments)   Reaction unknown   Morphine And Related Other (See Comments)   Confusion,  constipation.   Talwin [pentazocine] Other (See Comments)   "climbing walls" anxiety        Medication List        Accurate as of August 21, 2022  3:19 PM. If you have any questions, ask your nurse or doctor.          Alpha-Lipoic Acid 600 MG Caps Take 1,200 mg by mouth daily.   amitriptyline 25 MG tablet Commonly known as: ELAVIL Take 1 tablet (25 mg total) by mouth at bedtime.   atorvastatin 40 MG tablet Commonly known as: LIPITOR Take 40 mg by mouth daily.   carvedilol 12.5 MG tablet Commonly known as: COREG Take 12.5 mg by mouth 2 (two) times daily with a meal.   CINNAMON PO Take 2,000 mg by mouth daily.   clopidogrel 75 MG tablet Commonly known as: PLAVIX Take 1 tablet (75 mg total) by mouth at bedtime. Restart on 12/04/2020   diclofenac Sodium 1 % Gel Commonly known as: VOLTAREN Apply 4 g topically 4 (four) times daily. What changed: additional instructions   dicyclomine 10 MG capsule Commonly known as: BENTYL TAKE 1 CAPSULE THREE TIMES A DAY BEFORE MEALS (NEED APPOINTMENT)   donepezil 10 MG tablet Commonly known as: ARICEPT TAKE 1 TABLET AT BEDTIME   Emgality 120 MG/ML Soaj Generic drug: Galcanezumab-gnlm Inject 120 mg into the skin every 30 (thirty) days.  famotidine 20 MG tablet Commonly known as: PEPCID TAKE 1 TABLET TWICE A DAY   freestyle lancets CHECK BLOOD SUGAR TWICE DAILY   FREESTYLE LITE test strip Generic drug: glucose blood USE TO CHECK BLOOD SUGAR NO MORE THAN TWICE DAILY   furosemide 20 MG tablet Commonly known as: LASIX Take 20 mg by mouth daily.   HYDROcodone-acetaminophen 10-325 MG tablet Commonly known as: NORCO Take 1 tablet by mouth every 6 (six) hours as needed.   hydrocortisone 2.5 % rectal cream Commonly known as: ANUSOL-HC Place 1 application rectally 2 (two) times daily. As needed   hydrocortisone 25 MG suppository Commonly known as: ANUSOL-HC Place 1 suppository (25 mg total) rectally 2 (two) times  daily. As needed   ketoconazole 2 % cream Commonly known as: NIZORAL Apply 1 application. topically daily.   linaclotide 72 MCG capsule Commonly known as: Linzess Take 1 capsule (72 mcg total) by mouth daily before breakfast.   loperamide 2 MG tablet Commonly known as: IMODIUM A-D Take 2 mg by mouth as needed for diarrhea or loose stools.   MegaRed Omega-3 Krill Oil 500 MG Caps Restart on 12/04/2020   methenamine 1 g tablet Commonly known as: HIPREX Take 0.5 tablets by mouth daily.   naloxone 4 MG/0.1ML Liqd nasal spray kit Commonly known as: NARCAN Place 1 spray into the nose once as needed for up to 1 dose.   nitroGLYCERIN 0.4 MG SL tablet Commonly known as: NITROSTAT Place 1 tablet (0.4 mg total) under the tongue every 5 (five) minutes x 3 doses as needed for chest pain.   omeprazole 20 MG capsule Commonly known as: PRILOSEC Take 1 capsule (20 mg total) by mouth 2 (two) times daily before a meal.   ondansetron 4 MG tablet Commonly known as: ZOFRAN TAKE 1 TABLET(4 MG) BY MOUTH EVERY 8 HOURS AS NEEDED FOR NAUSEA OR VOMITING   OVER THE COUNTER MEDICATION 1 tablet 3 (three) times daily. Berberorine Gluco Defense   pramipexole 0.125 MG tablet Commonly known as: MIRAPEX Take 1-2 tablets (0.125-0.25 mg total) by mouth at bedtime as needed.   predniSONE 10 MG tablet Commonly known as: DELTASONE Take 1 tablet (10 mg total) by mouth daily with breakfast.   PreserVision AREDS 2 Caps Take 1 capsule by mouth 2 (two) times daily.   PROBIOTIC PEARLS PO Take 1 tablet by mouth daily.   REFRESH OP Place 1 drop into both eyes 3 (three) times daily as needed (dry eyes).   rivaroxaban 2.5 MG Tabs tablet Commonly known as: XARELTO Take 1 tablet (2.5 mg total) by mouth 2 (two) times daily. Restart on 12/04/2020   silver sulfADIAZINE 1 % cream Commonly known as: Silvadene Apply 1 application topically daily.   Tussin DM 10-100 MG/5ML liquid Generic drug:  dextromethorphan-guaiFENesin Take by mouth every 4 (four) hours as needed for cough.   Vitamin B-12 5000 MCG Subl Place 5,000 mcg under the tongue daily.   vitamin C with rose hips 500 MG tablet Take 500 mg by mouth in the morning and at bedtime.   Vitamin D3 125 MCG (5000 UT) Caps Take 5,000 Units by mouth every evening. Reported on 11/24/2015   vitamin E 180 MG (400 UNITS) capsule Take 400 Units by mouth daily.        Allergies:  Allergies  Allergen Reactions   Metformin And Related Diarrhea, Nausea Only and Other (See Comments)    dizzy, tired, chills   Cymbalta [Duloxetine Hcl] Swelling    Swelling in legs  Gabapentin Swelling    Swelling in legs   Silicone Hives, Itching, Dermatitis and Rash   Tramadol Swelling   Adhesive [Tape] Rash   Cefazolin Hives        Ciprofloxacin Other (See Comments)    body aches    Clorazepate Dipotassium Other (See Comments)    Unknown reaction   Dilaudid [Hydromorphone Hcl] Itching     confused,   Doxycycline Rash   Enablex [Darifenacin Hydrobromide Er] Other (See Comments)    Hypotension, near syncope   Levofloxacin Other (See Comments)    Causes wrist pain   Lyrica [Pregabalin] Swelling    Swelling in legs   Methadone Hcl Other (See Comments)    Reaction unknown   Morphine And Related Other (See Comments)    Confusion, constipation.    Talwin [Pentazocine] Other (See Comments)    "climbing walls" anxiety    Past Medical History, Surgical history, Social history, and Family History were reviewed and updated.  Review of Systems: All other 10 point review of systems is negative.   Physical Exam:  weight is 177 lb (80.3 kg). Her oral temperature is 98.3 F (36.8 C). Her blood pressure is 117/57 (abnormal) and her pulse is 80. Her respiration is 17 and oxygen saturation is 100%.   Wt Readings from Last 3 Encounters:  08/21/22 177 lb (80.3 kg)  04/11/22 162 lb (73.5 kg)  04/03/22 161 lb (73 kg)    Ocular: Sclerae  unicteric, pupils equal, round and reactive to light Ear-nose-throat: Oropharynx clear, dentition fair Lymphatic: No cervical or supraclavicular adenopathy Lungs no rales or rhonchi, good excursion bilaterally Heart regular rate and rhythm, no murmur appreciated Abd soft, nontender, positive bowel sounds MSK no focal spinal tenderness, no joint edema Neuro: non-focal, well-oriented, appropriate affect Breasts: Deferred   Lab Results  Component Value Date   WBC 5.1 08/21/2022   HGB 10.8 (L) 08/21/2022   HCT 33.5 (L) 08/21/2022   MCV 99.7 08/21/2022   PLT 134 (L) 08/21/2022   Lab Results  Component Value Date   FERRITIN 138 05/15/2022   IRON 79 05/15/2022   TIBC 248 (L) 05/15/2022   UIBC 169 05/15/2022   IRONPCTSAT 32 (H) 05/15/2022   Lab Results  Component Value Date   RETICCTPCT 1.8 08/21/2022   RBC 3.35 (L) 08/21/2022   No results found for: "KPAFRELGTCHN", "LAMBDASER", "KAPLAMBRATIO" No results found for: "IGGSERUM", "IGA", "IGMSERUM" No results found for: "TOTALPROTELP", "ALBUMINELP", "A1GS", "A2GS", "BETS", "BETA2SER", "GAMS", "MSPIKE", "SPEI"   Chemistry      Component Value Date/Time   NA 129 (L) 07/31/2022 1349   NA 140 09/03/2020 0000   K 4.7 07/31/2022 1349   CL 98 07/31/2022 1349   CO2 22 07/31/2022 1349   BUN 18 07/31/2022 1349   BUN 17 09/03/2020 0000   CREATININE 0.87 07/31/2022 1349   CREATININE 1.17 (H) 04/11/2022 1443   CREATININE 0.89 (H) 08/27/2020 1634   GLU 146 09/03/2020 0000      Component Value Date/Time   CALCIUM 7.2 (L) 07/31/2022 1349   ALKPHOS 39 04/11/2022 1443   AST 10 (L) 04/11/2022 1443   ALT 7 04/11/2022 1443   BILITOT 0.3 04/11/2022 1443       Impression and Plan: [name] is a very pleasant 87 yo caucasian female with iron deficiency anemia secondary to GI blood loss and mild thrombocytopenia.  Iron studies are pending.  Follow-up in 4 months.   Lottie Dawson, NP 10/30/20233:19 PM

## 2022-08-22 ENCOUNTER — Encounter: Payer: Self-pay | Admitting: Family

## 2022-08-22 LAB — IRON AND IRON BINDING CAPACITY (CC-WL,HP ONLY)
Iron: 70 ug/dL (ref 28–170)
Saturation Ratios: 24 % (ref 10.4–31.8)
TIBC: 297 ug/dL (ref 250–450)
UIBC: 227 ug/dL (ref 148–442)

## 2022-08-25 DIAGNOSIS — H90A32 Mixed conductive and sensorineural hearing loss, unilateral, left ear with restricted hearing on the contralateral side: Secondary | ICD-10-CM | POA: Diagnosis not present

## 2022-08-25 DIAGNOSIS — H90A21 Sensorineural hearing loss, unilateral, right ear, with restricted hearing on the contralateral side: Secondary | ICD-10-CM | POA: Diagnosis not present

## 2022-08-30 ENCOUNTER — Telehealth: Payer: Self-pay | Admitting: Pharmacist

## 2022-08-30 NOTE — Telephone Encounter (Signed)
Patient will be due to get next Prolia injection around 09/27/2022. Sending request for Rx team to do EOB verification.

## 2022-09-01 NOTE — Telephone Encounter (Signed)
Pt ready for scheduling on or after 09/27/2022   Out-of-pocket cost due at time of visit: $0   Primary: Medicare Prolia co-insurance: 0% Admin fee co-insurance: 0%   Secondary: [employer] Prolia co-insurance: Covers Medicare Part B co-insurance Admin fee co-insurance: Covers Medicare Part B co-insurance    Deductible: $226 of $226 met (as of 09/01/2022)   Prior Auth: not required PA# Valid:    ** This summary of benefits is an estimation of the patient's out-of-pocket cost. Exact cost may vary based on individual plan coverage.    

## 2022-09-05 ENCOUNTER — Encounter: Payer: Self-pay | Admitting: Internal Medicine

## 2022-09-11 ENCOUNTER — Other Ambulatory Visit: Payer: Self-pay | Admitting: Internal Medicine

## 2022-09-11 DIAGNOSIS — G43009 Migraine without aura, not intractable, without status migrainosus: Secondary | ICD-10-CM

## 2022-09-11 NOTE — Telephone Encounter (Signed)
Pt ready for scheduling on or after 09/28/22   Out-of-pocket cost due at time of visit: $0   Primary: Medicare Prolia co-insurance: 20% (approximately $276) Admin fee co-insurance: 20% (approximately $25)   Secondary: [employer] Supp Prolia co-insurance: Covers Medicare Part B co-insurance Admin fee co-insurance: Covers Medicare Part B co-insurance   Deductible:  Covered by secondary ($226 met of $226 required)   Prior Auth: not required PA# Valid:    ** This summary of benefits is an estimation of the patient's out-of-pocket cost. Exact cost may vary based on individual plan coverage.

## 2022-09-12 NOTE — Telephone Encounter (Signed)
Mychart message sent.

## 2022-09-15 ENCOUNTER — Other Ambulatory Visit: Payer: Self-pay | Admitting: Gastroenterology

## 2022-09-16 ENCOUNTER — Encounter: Payer: Self-pay | Admitting: Internal Medicine

## 2022-09-18 DIAGNOSIS — I70213 Atherosclerosis of native arteries of extremities with intermittent claudication, bilateral legs: Secondary | ICD-10-CM | POA: Diagnosis not present

## 2022-09-18 DIAGNOSIS — E1152 Type 2 diabetes mellitus with diabetic peripheral angiopathy with gangrene: Secondary | ICD-10-CM | POA: Diagnosis not present

## 2022-09-18 DIAGNOSIS — I10 Essential (primary) hypertension: Secondary | ICD-10-CM | POA: Diagnosis not present

## 2022-09-18 DIAGNOSIS — E785 Hyperlipidemia, unspecified: Secondary | ICD-10-CM | POA: Diagnosis not present

## 2022-09-18 DIAGNOSIS — I70244 Atherosclerosis of native arteries of left leg with ulceration of heel and midfoot: Secondary | ICD-10-CM | POA: Diagnosis not present

## 2022-09-19 DIAGNOSIS — N3020 Other chronic cystitis without hematuria: Secondary | ICD-10-CM | POA: Diagnosis not present

## 2022-09-20 ENCOUNTER — Ambulatory Visit: Payer: Medicare Other | Admitting: Internal Medicine

## 2022-09-20 ENCOUNTER — Telehealth: Payer: Self-pay | Admitting: Internal Medicine

## 2022-09-20 MED ORDER — HYDROCODONE-ACETAMINOPHEN 10-325 MG PO TABS: 1.0000 | ORAL_TABLET | Freq: Four times a day (QID) | ORAL | 0 refills | Status: DC | PRN

## 2022-09-20 NOTE — Telephone Encounter (Signed)
PDMP reviewed, prescription sent 

## 2022-09-20 NOTE — Telephone Encounter (Signed)
Requesting: hydrocodone 10-'325mg'$   Contract:02/27/22 UDS:02/27/22 Last Visit: 07/03/22 Next Visit:11/06/22 Last Refill: 07/27/22 #120 and 0RF   Please Advise

## 2022-09-20 NOTE — Telephone Encounter (Signed)
Medication: HYDROcodone-acetaminophen (NORCO) 10-325 MG tablet  Has the patient contacted their pharmacy? No.   Preferred Pharmacy (with phone number or street name):  [facility] [place], [address] AT [address] [facility] [address] [place] [address] Phone: [phone]  Fax: [phone] DEA #: [id number]    Agent: Please be advised that RX refills may take up to 3 business days. We ask that you follow-up with your pharmacy.

## 2022-09-21 DIAGNOSIS — I10 Essential (primary) hypertension: Secondary | ICD-10-CM | POA: Diagnosis not present

## 2022-09-21 DIAGNOSIS — E785 Hyperlipidemia, unspecified: Secondary | ICD-10-CM | POA: Diagnosis not present

## 2022-09-21 DIAGNOSIS — I70213 Atherosclerosis of native arteries of extremities with intermittent claudication, bilateral legs: Secondary | ICD-10-CM | POA: Diagnosis not present

## 2022-09-21 DIAGNOSIS — E1152 Type 2 diabetes mellitus with diabetic peripheral angiopathy with gangrene: Secondary | ICD-10-CM | POA: Diagnosis not present

## 2022-09-21 DIAGNOSIS — L8990 Pressure ulcer of unspecified site, unspecified stage: Secondary | ICD-10-CM | POA: Diagnosis not present

## 2022-09-21 DIAGNOSIS — I70244 Atherosclerosis of native arteries of left leg with ulceration of heel and midfoot: Secondary | ICD-10-CM | POA: Diagnosis not present

## 2022-09-27 DIAGNOSIS — L8990 Pressure ulcer of unspecified site, unspecified stage: Secondary | ICD-10-CM | POA: Diagnosis not present

## 2022-09-27 DIAGNOSIS — E1152 Type 2 diabetes mellitus with diabetic peripheral angiopathy with gangrene: Secondary | ICD-10-CM | POA: Diagnosis not present

## 2022-09-27 DIAGNOSIS — I70245 Atherosclerosis of native arteries of left leg with ulceration of other part of foot: Secondary | ICD-10-CM | POA: Diagnosis not present

## 2022-09-27 DIAGNOSIS — I70244 Atherosclerosis of native arteries of left leg with ulceration of heel and midfoot: Secondary | ICD-10-CM | POA: Diagnosis not present

## 2022-09-28 DIAGNOSIS — Z961 Presence of intraocular lens: Secondary | ICD-10-CM | POA: Diagnosis not present

## 2022-09-28 DIAGNOSIS — H40013 Open angle with borderline findings, low risk, bilateral: Secondary | ICD-10-CM | POA: Diagnosis not present

## 2022-09-28 DIAGNOSIS — H18593 Other hereditary corneal dystrophies, bilateral: Secondary | ICD-10-CM | POA: Diagnosis not present

## 2022-09-28 DIAGNOSIS — H04123 Dry eye syndrome of bilateral lacrimal glands: Secondary | ICD-10-CM | POA: Diagnosis not present

## 2022-09-28 LAB — HM DIABETES EYE EXAM

## 2022-10-02 DIAGNOSIS — I70213 Atherosclerosis of native arteries of extremities with intermittent claudication, bilateral legs: Secondary | ICD-10-CM | POA: Diagnosis not present

## 2022-10-03 ENCOUNTER — Ambulatory Visit (INDEPENDENT_AMBULATORY_CARE_PROVIDER_SITE_OTHER): Payer: Medicare Other | Admitting: *Deleted

## 2022-10-03 DIAGNOSIS — M810 Age-related osteoporosis without current pathological fracture: Secondary | ICD-10-CM | POA: Diagnosis not present

## 2022-10-03 DIAGNOSIS — M1990 Unspecified osteoarthritis, unspecified site: Secondary | ICD-10-CM

## 2022-10-03 MED ORDER — DENOSUMAB 60 MG/ML ~~LOC~~ SOSY
60.0000 mg | PREFILLED_SYRINGE | Freq: Once | SUBCUTANEOUS | Status: AC
  Administered 2022-10-03: 60 mg via SUBCUTANEOUS

## 2022-10-03 NOTE — Progress Notes (Signed)
[name] is a 87 y.o. female presents to the office today for Prolia injection, per physician's orders. Original order: 03/17/22- Dr Larose Kells: "Continue with Prolia"  Prolia 60 mg SQ, was administered R arm today. Patient tolerated injection. Patient due for follow up labs/provider appt: No. Patient next injection due: 6 months, appt made No- will wait for approval with insurance

## 2022-10-04 ENCOUNTER — Encounter: Payer: Self-pay | Admitting: Gastroenterology

## 2022-10-04 ENCOUNTER — Ambulatory Visit (INDEPENDENT_AMBULATORY_CARE_PROVIDER_SITE_OTHER): Payer: Medicare Other | Admitting: Gastroenterology

## 2022-10-04 VITALS — BP 118/62 | HR 80 | Ht 64.0 in | Wt 270.0 lb

## 2022-10-04 DIAGNOSIS — I2510 Atherosclerotic heart disease of native coronary artery without angina pectoris: Secondary | ICD-10-CM | POA: Diagnosis not present

## 2022-10-04 DIAGNOSIS — K582 Mixed irritable bowel syndrome: Secondary | ICD-10-CM

## 2022-10-04 DIAGNOSIS — D500 Iron deficiency anemia secondary to blood loss (chronic): Secondary | ICD-10-CM

## 2022-10-04 DIAGNOSIS — K649 Unspecified hemorrhoids: Secondary | ICD-10-CM

## 2022-10-04 DIAGNOSIS — Z7901 Long term (current) use of anticoagulants: Secondary | ICD-10-CM | POA: Diagnosis not present

## 2022-10-04 MED ORDER — HYDROCORTISONE (PERIANAL) 2.5 % EX CREA: 1.0000 | TOPICAL_CREAM | Freq: Two times a day (BID) | CUTANEOUS | 3 refills | Status: DC

## 2022-10-04 NOTE — Patient Instructions (Signed)
We have sent the following medications to your pharmacy for you to pick up at your convenience: Anusol cream  Due to recent changes in healthcare laws, you may see the results of your imaging and laboratory studies on MyChart before your provider has had a chance to review them.  We understand that in some cases there may be results that are confusing or concerning to you. Not all laboratory results come back in the same time frame and the provider may be waiting for multiple results in order to interpret others.  Please give [place] 48 hours in order for your provider to thoroughly review all the results before contacting the office for clarification of your results.    I appreciate the opportunity to care for you. Harl Bowie, MD

## 2022-10-04 NOTE — Progress Notes (Signed)
[name]    [id number]    24-Apr-1935  Primary Care Physician:Paz, Alda Berthold, MD  Referring Physician: Colon Branch, MD [address] [place],  [place] [address]   Chief complaint:  Iron def anemia, bleeding hemorrhoids  HPI:  87 year old very pleasant female with history of CAD, and STEMI status post CABG, peripheral vascular disease status post partial amputation of left lower leg with prosthesis on chronic antiplatelet and anticoagulation here for follow-up visit for bleeding hemorrhoids, irregular bowel habits with alternating constipation and diarrhea  On average she has bowel movement every 1 to 2 days with alternating constipation and diarrhea, she tries to take Imodium as soon as she has a large semiformed bowel movement and her son is encouraging her to not do that to prevent constipation. She has bleeding hemorrhoids multiple times a week and she also has a tear in the perianal area she has bleeding from, had a fall last week in the bathroom and she scraped her bottom   She is receiving IV iron infusion.  She had fecal Hemoccult checked by hematology, was positive   She is s/p hemorrhoidal band ligation in 2017.  She was evaluated by [place] [facility] for hemorrhoidectomy, was thought not to be a candidate for surgical repair.   CT abdomen and pelvis with contrast 12/24/2015 1. No acute abdominopelvic findings. 2. Colonic diverticulosis without evidence of acute diverticulitis. 3. Single bubble of air within the urinary bladder. Correlate for history of recent instrumentation. 4. Postsurgical changes at the gastroesophageal junction from Nissen fundoplication with small recurrent hiatal hernia.   Colonoscopy March 2017 with internal and external hemorrhoids, left-sided diverticulosis, removal of 3 subcentimeter sessile and pedunculated polyps [tubular adenomas]   Outpatient Encounter Medications as of 10/04/2022  Medication Sig   Alpha-Lipoic  Acid 600 MG CAPS Take 1,200 mg by mouth daily.   amitriptyline (ELAVIL) 25 MG tablet Take 1 tablet (25 mg total) by mouth at bedtime.   Ascorbic Acid (VITAMIN C WITH ROSE HIPS) 500 MG tablet Take 500 mg by mouth in the morning and at bedtime.   atorvastatin (LIPITOR) 40 MG tablet Take 40 mg by mouth daily.   carvedilol (COREG) 12.5 MG tablet Take 12.5 mg by mouth 2 (two) times daily with a meal.   Cholecalciferol (VITAMIN D3) 5000 UNITS CAPS Take 5,000 Units by mouth every evening. Reported on 11/24/2015   CINNAMON PO Take 2,000 mg by mouth daily.   clopidogrel (PLAVIX) 75 MG tablet Take 1 tablet (75 mg total) by mouth at bedtime. Restart on 12/04/2020   Cyanocobalamin (VITAMIN B-12) 5000 MCG SUBL Place 5,000 mcg under the tongue daily. (Patient not taking: Reported on 08/21/2022)   dextromethorphan-guaiFENesin (TUSSIN DM) 10-100 MG/5ML liquid Take by mouth every 4 (four) hours as needed for cough.   diclofenac Sodium (VOLTAREN) 1 % GEL Apply 4 g topically 4 (four) times daily. (Patient taking differently: Apply 4 g topically 4 (four) times daily. If needed for pain)   dicyclomine (BENTYL) 10 MG capsule TAKE 1 CAPSULE THREE TIMES A DAY BEFORE MEALS (NEED APPOINTMENT)   donepezil (ARICEPT) 10 MG tablet TAKE 1 TABLET AT BEDTIME   famotidine (PEPCID) 20 MG tablet TAKE 1 TABLET TWICE A DAY   furosemide (LASIX) 20 MG tablet Take 20 mg by mouth daily.   Galcanezumab-gnlm (EMGALITY) 120 MG/ML SOAJ Inject 120 mg into the skin every 30 (thirty) days. (Patient not taking: Reported on 05/15/2022)  glucose blood (FREESTYLE LITE) test strip USE TO CHECK BLOOD SUGAR NO MORE THAN TWICE DAILY   HYDROcodone-acetaminophen (NORCO) 10-325 MG tablet Take 1 tablet by mouth every 6 (six) hours as needed.   hydrocortisone (ANUSOL-HC) 2.5 % rectal cream Place 1 application rectally 2 (two) times daily. As needed   hydrocortisone (ANUSOL-HC) 25 MG suppository Place 1 suppository (25 mg total) rectally 2 (two) times daily. As  needed   ketoconazole (NIZORAL) 2 % cream Apply 1 application. topically daily.   Lancets (FREESTYLE) lancets CHECK BLOOD SUGAR TWICE DAILY   linaclotide (LINZESS) 72 MCG capsule Take 1 capsule (72 mcg total) by mouth daily before breakfast.   loperamide (IMODIUM A-D) 2 MG tablet Take 2 mg by mouth as needed for diarrhea or loose stools.   MegaRed Omega-3 Krill Oil 500 MG CAPS Restart on 12/04/2020   methenamine (HIPREX) 1 g tablet Take 0.5 tablets by mouth daily.   Multiple Vitamins-Minerals (PRESERVISION AREDS 2) CAPS Take 1 capsule by mouth 2 (two) times daily.   naloxone (NARCAN) nasal spray 4 mg/0.1 mL Place 1 spray into the nose once as needed for up to 1 dose. (Patient not taking: Reported on 07/03/2022)   nitroGLYCERIN (NITROSTAT) 0.4 MG SL tablet Place 1 tablet (0.4 mg total) under the tongue every 5 (five) minutes x 3 doses as needed for chest pain. (Patient not taking: Reported on 07/03/2022)   omeprazole (PRILOSEC) 20 MG capsule TAKE 1 CAPSULE TWICE A DAY BEFORE MEALS   ondansetron (ZOFRAN) 4 MG tablet TAKE 1 TABLET(4 MG) BY MOUTH EVERY 8 HOURS AS NEEDED FOR NAUSEA OR VOMITING   OVER THE COUNTER MEDICATION 1 tablet 3 (three) times daily. Berberorine Gluco Defense   Polyvinyl Alcohol-Povidone (REFRESH OP) Place 1 drop into both eyes 3 (three) times daily as needed (dry eyes).    pramipexole (MIRAPEX) 0.125 MG tablet Take 1-2 tablets (0.125-0.25 mg total) by mouth at bedtime as needed.   predniSONE (DELTASONE) 10 MG tablet Take 1 tablet (10 mg total) by mouth daily with breakfast.   Probiotic Product (PROBIOTIC PEARLS PO) Take 1 tablet by mouth daily.   rivaroxaban (XARELTO) 2.5 MG TABS tablet Take 1 tablet (2.5 mg total) by mouth 2 (two) times daily. Restart on 12/04/2020   silver sulfADIAZINE (SILVADENE) 1 % cream Apply 1 application topically daily.   vitamin E 180 MG (400 UNITS) capsule Take 400 Units by mouth daily.   No facility-administered encounter medications on file as of  10/04/2022.    Allergies as of 10/04/2022 - Review Complete 08/21/2022  Allergen Reaction Noted   Metformin and related Diarrhea, Nausea Only, and Other (See Comments) 12/10/2015   Cymbalta [duloxetine hcl] Swelling 09/25/2012   Gabapentin Swelling 19/62/2297   Silicone Hives, Itching, Dermatitis, and Rash 04/11/2022   Tramadol Swelling 10/28/2021   Adhesive [tape] Rash 07/17/2011   Cefazolin Hives 03/06/2012   Ciprofloxacin Other (See Comments) 01/31/2013   Clorazepate dipotassium Other (See Comments)    Dilaudid [hydromorphone hcl] Itching 03/11/2010   Doxycycline Rash 11/03/2008   Enablex [darifenacin hydrobromide er] Other (See Comments) 03/06/2012   Levofloxacin Other (See Comments) 01/08/2014   Lyrica [pregabalin] Swelling 05/08/2013   Methadone hcl Other (See Comments)    Morphine and related Other (See Comments) 10/31/2013   Talwin [pentazocine] Other (See Comments) 12/04/2012    Past Medical History:  Diagnosis Date   Allergic rhinitis    Arthritis    "back; ankles; hands; knees" (10/31/2013)   Bilateral sensorineural hearing loss    CAD (  coronary artery disease)    a. Nonobst in 2011. b. Abnormal nuc 09/2013 -> s/p cutting balloon to D2, mild LAD disease; c. 08/2015 MV: no ischemia, EF 71%.   Carcinoma in situ in a polyp 1994   a. 1994 - malignant polyp removed during colonoscopy.   Chronic diastolic CHF (congestive heart failure) ([facility])    a. 09/2013 Echo: EF 60-65%, mild LVH, Gr1 DD.   Dementia ([facility])    Diverticulosis    DJD (degenerative joint disease)    Fatty liver    GERD (gastroesophageal reflux disease)    a. Hx GERD/esophageal dysmotility followed by Dr. Olevia Perches.    Hemorrhoids    Hiatal hernia    a. s/p Nissen fundoplication 1517.   Hyperlipidemia    a. patient unwilling to use statins.   Hypertensive heart disease    IBS (irritable bowel syndrome)    Macular degeneration 03/2009   Dr. Rosana Hoes   Neuropathy    a. Hands, feet, legs.   Orthostatic  hypotension    Osteomyelitis (HCC)    a. 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 01/2016 s/p RLE transtibial amputation 2/2 Charcot rocker-bottom deformity and insensate neuropathy ulceration.   Osteoporosis    PE (pulmonary embolism) 12/2007   a. PE/DVT after neck surgery 2009. b. coumadin d/c 10-2008.   PONV (postoperative nausea and vomiting) 2009   neck surgery   PPD positive    Recurrent UTI    RSD (reflex sympathetic dystrophy)    a. Chronic pain.   Spinal stenosis    Type II diabetes mellitus ([place])    no on medication   Venous insufficiency    a. Contributing to LEE.    Past Surgical History:  Procedure Laterality Date   AMPUTATION  03/06/2012   Procedure: AMPUTATION FOOT;  Surgeon: Newt Minion, MD;  Location: [facility];  Service: Orthopedics;  Laterality: Left;  FIFTH RAY AMPUTATION    AMPUTATION Right 02/18/2016   Procedure: AMPUTATION BELOW KNEE;  Surgeon: Newt Minion, MD;  Location: [facility];  Service: Orthopedics;  Laterality: Right;   AMPUTATION Left 11/22/2016   Procedure: Amputation 4th Toe Left Foot at Metatarsophalangeal Joint;  Surgeon: Newt Minion, MD;  Location: [facility];  Service: Orthopedics;  Laterality: Left;   ANKLE FUSION  09/27/2012   Procedure: ANKLE FUSION;  Surgeon: Newt Minion, MD;  Location: [facility];  Service: Orthopedics;  Laterality: Left;  Left Tibiocalcaneal Fusion   ANKLE FUSION Right 05/09/2013   Procedure: ANKLE FUSION;  Surgeon: Newt Minion, MD;  Location: [facility];  Service: Orthopedics;  Laterality: Right;  Excision Osteomyelitis Base 1st MT Right Foot, Fusion Medial Column   ANTERIOR CERVICAL DECOMP/DISCECTOMY FUSION  12/18/07   For OA,  Dr. Lorin Mercy:  fu by a PE   ANTERIOR CERVICAL DECOMP/DISCECTOMY FUSION N/A 11/29/2020   Procedure: Anterior Cervical Decompression Fusion - Cervical Three-Cerivcal Four;  Surgeon: Earnie Larsson, MD;  Location: [facility];  Service: Neurosurgery;  Laterality: N/A;  3C    CARDIAC CATHETERIZATION  05/2010    at [facility] Left 09/2011   CATARACT EXTRACTION W/ INTRAOCULAR LENS  IMPLANT, BILATERAL  2004   feb 2004 left, aug 2004 right   CHOLECYSTECTOMY  03/2002   CORONARY ANGIOPLASTY  10/31/2013   CORONARY ARTERY BYPASS GRAFT  09/27/2016   CYST REMOVAL HAND  06/2003   FOOT BONE EXCISION Right 06/2009   LAPAROSCOPIC RIGHT HEMI COLECTOMY  N/A 01/08/2015   Procedure: LAPAROSCOPIC ASSISTED RIGHT HEMI COLECTOMY;  Surgeon: Johnathan Hausen, MD;  Location: [facility];  Service: General;  Laterality: N/A;   LEFT HEART CATHETERIZATION WITH CORONARY ANGIOGRAM N/A 10/31/2013   Procedure: LEFT HEART CATHETERIZATION WITH CORONARY ANGIOGRAM;  Surgeon: Jettie Booze, MD;  Location: [facility] C[facility] LAB;  Service: Cardiovascular;  Laterality: N/A;   NASAL SEPTUM SURGERY  19/6222   NISSEN FUNDOPLICATION  06/30/9891   PERCUTANEOUS CORONARY INTERVENTION-BALLOON ONLY  10/31/2013   Procedure: PERCUTANEOUS CORONARY INTERVENTION-BALLOON ONLY;  Surgeon: Jettie Booze, MD;  Location: [facility] C[facility] LAB;  Service: Cardiovascular;;   ROTATOR CUFF REPAIR Right 07/2007   Dr. Morene Rankins CUFF REPAIR Left 11/2004   stent in left leg, behind knee  06/27/2017   x2, done at [facility] in [facility]   TOE AMPUTATION Right 09/26/2008   3rd toe, Dr. Sharol Given due to osteomyelitis   VAGINAL HYSTERECTOMY  03/1975   VEIN LIGATION Bilateral 03/1966   VENA CAVA FILTER PLACEMENT  01/2010   green filter; "due to blood clots"   WISDOM TOOTH EXTRACTION  06/2007    Family History  Problem Relation Age of Onset   Heart disease Mother        mitral valve replaced   Arthritis Mother    Diabetic kidney disease Daughter    Diabetes Father    Stroke Father    Migraines Sister    Hypertension Other    Breast cancer Other    Colon cancer Neg Hx    Esophageal cancer Neg Hx    Stomach cancer Neg Hx    Rectal cancer Neg Hx     Social History   Socioeconomic History   Marital status:  Divorced    Spouse name: Not on file   Number of children: 3   Years of education: 15   Highest education level: Not on file  Occupational History   Occupation: Retired     Comment: from social services   Tobacco Use   Smoking status: Never   Smokeless tobacco: Never   Tobacco comments:    tried for a few months in college.   Vaping Use   Vaping Use: Never used  Substance and Sexual Activity   Alcohol use: No    Alcohol/week: 0.0 standard drinks of alcohol   Drug use: No   Sexual activity: Not Currently    Birth control/protection: Post-menopausal  Other Topics Concern   Not on file  Social History Narrative   Daughter [name] deceased 09-26-20   daughter lives in town   1 son in [place], Cl[place]ia Cuff comes home every 4 weeks to visit   Caffeine use:  Tea 1/day w/ dinner   Coffee occass          Currently lives in [place] with son Merrillyn Ackerley.   Social Determinants of Health   Financial Resource Strain: Low Risk  (04/03/2022)   Overall Financial Resource Strain (CARDIA)    Difficulty of Paying Living Expenses: Not hard at all  Food Insecurity: No Food Insecurity (04/03/2022)   Hunger Vital Sign    Worried About Running Out of Food in the Last Year: Never true    Ran Out of Food in the Last Year: Never true  Transportation Needs: No Transportation Needs (11/21/2021)   PRAPARE - Hydrologist (Medical): No    Lack of Transportation (Non-Medical): No  Physical Activity: Inactive (02/24/2021)   Exercise Vital Sign  Days of Exercise per Week: 0 days    Minutes of Exercise per Session: 0 min  Stress: No Stress Concern Present (04/03/2022)   [place]    Feeling of Stress : Not at all  Social Connections: Socially Isolated (04/03/2022)   Social Connection and Isolation Panel [NHANES]    Frequency of Communication with Friends and Family: Twice a week    Frequency of Social Gatherings  with Friends and Family: Once a week    Attends Religious Services: Never    Marine scientist or Organizations: No    Attends Archivist Meetings: Never    Marital Status: Divorced  Human resources officer Violence: Not At Risk (04/03/2022)   Humiliation, Afraid, Rape, and Kick questionnaire    Fear of Current or Ex-Partner: No    Emotionally Abused: No    Physically Abused: No    Sexually Abused: No      Review of systems: All other review of systems negative except as mentioned in the HPI.   Physical Exam: Vitals:   10/04/22 1124  BP: 118/62  Pulse: 80  SpO2: 99%   Body mass index is 46.35 kg/m. Gen:      No acute distress, wheelchair-bound, transfers with help HEENT:  sclera anicteric Abd:      soft, non-tender; no palpable masses, no distension Ext: Prosthetic right lower extremity Neuro: alert and oriented x 3 Psych: normal mood and affect  Data Reviewed:  Reviewed labs, radiology imaging, old records and pertinent past GI work up   Assessment and Plan/Recommendations:  87 year old female with CAD s/p CABG, peripheral vascular disease s/p left leg amputation on Plavix and Xarelto here with complaints of irregular bowel habits with alternating constipation/diarrhea, intermittent rectal bleeding and iron deficiency anemia   Irritable bowel syndrome with alternating constipation and diarrhea Advised patient to use additional stool softener, docusate daily as needed to prevent constipation.  If she is having difficulty evacuating, she can use glycerin suppositories as needed Encouraged patient to increase dietary fiber and increase water to 8 cups daily   Rectal bleeding: secondary to small-volume hemorrhage from internal hemorrhoids in the setting of dual antiplatelet therapy and anticoagulation She is not a candidate for hemorrhoidal band ligation or hemorrhoidectomy We will try to conservatively manage with Anusol cream and suppositories as needed Avoid  excessive straining during defecation  Iron deficiency anemia secondary to chronic GI blood loss/rectal bleeding.  Discussed in detail the potential benefit and risks associated diagnostic endoscopic evaluation, patient is not a surgical candidate.  Patient and family opted to defer invasive procedures, continue conservative management.  Continue monitoring and IV iron infusion as needed through hematology   Return as needed   This visit required 40 minutes of patient care (this includes precharting, chart review, review of results, face-to-face time used for counseling as well as treatment plan and follow-up. The patient and her son were provided an opportunity to ask questions and all were answered. The patient and her son agreed with the plan and demonstrated an understanding of the instructions.   Damaris Hippo , MD    CC: Colon Branch, MD

## 2022-10-05 DIAGNOSIS — I70213 Atherosclerosis of native arteries of extremities with intermittent claudication, bilateral legs: Secondary | ICD-10-CM | POA: Diagnosis not present

## 2022-10-05 DIAGNOSIS — L8990 Pressure ulcer of unspecified site, unspecified stage: Secondary | ICD-10-CM | POA: Diagnosis not present

## 2022-10-05 DIAGNOSIS — I7025 Atherosclerosis of native arteries of other extremities with ulceration: Secondary | ICD-10-CM | POA: Diagnosis not present

## 2022-10-05 DIAGNOSIS — I70245 Atherosclerosis of native arteries of left leg with ulceration of other part of foot: Secondary | ICD-10-CM | POA: Diagnosis not present

## 2022-10-05 DIAGNOSIS — I70244 Atherosclerosis of native arteries of left leg with ulceration of heel and midfoot: Secondary | ICD-10-CM | POA: Diagnosis not present

## 2022-10-05 DIAGNOSIS — E1152 Type 2 diabetes mellitus with diabetic peripheral angiopathy with gangrene: Secondary | ICD-10-CM | POA: Diagnosis not present

## 2022-10-10 ENCOUNTER — Encounter: Payer: Self-pay | Admitting: Gastroenterology

## 2022-10-11 DIAGNOSIS — I70213 Atherosclerosis of native arteries of extremities with intermittent claudication, bilateral legs: Secondary | ICD-10-CM | POA: Diagnosis not present

## 2022-10-12 DIAGNOSIS — I70244 Atherosclerosis of native arteries of left leg with ulceration of heel and midfoot: Secondary | ICD-10-CM | POA: Diagnosis not present

## 2022-10-12 DIAGNOSIS — I70245 Atherosclerosis of native arteries of left leg with ulceration of other part of foot: Secondary | ICD-10-CM | POA: Diagnosis not present

## 2022-10-12 DIAGNOSIS — L8990 Pressure ulcer of unspecified site, unspecified stage: Secondary | ICD-10-CM | POA: Diagnosis not present

## 2022-10-12 DIAGNOSIS — E1152 Type 2 diabetes mellitus with diabetic peripheral angiopathy with gangrene: Secondary | ICD-10-CM | POA: Diagnosis not present

## 2022-10-18 DIAGNOSIS — I10 Essential (primary) hypertension: Secondary | ICD-10-CM | POA: Diagnosis not present

## 2022-10-18 DIAGNOSIS — I70244 Atherosclerosis of native arteries of left leg with ulceration of heel and midfoot: Secondary | ICD-10-CM | POA: Diagnosis not present

## 2022-10-18 DIAGNOSIS — E785 Hyperlipidemia, unspecified: Secondary | ICD-10-CM | POA: Diagnosis not present

## 2022-10-18 DIAGNOSIS — L8990 Pressure ulcer of unspecified site, unspecified stage: Secondary | ICD-10-CM | POA: Diagnosis not present

## 2022-10-18 DIAGNOSIS — E1152 Type 2 diabetes mellitus with diabetic peripheral angiopathy with gangrene: Secondary | ICD-10-CM | POA: Diagnosis not present

## 2022-10-25 DIAGNOSIS — I70244 Atherosclerosis of native arteries of left leg with ulceration of heel and midfoot: Secondary | ICD-10-CM | POA: Diagnosis not present

## 2022-10-25 DIAGNOSIS — L8990 Pressure ulcer of unspecified site, unspecified stage: Secondary | ICD-10-CM | POA: Diagnosis not present

## 2022-10-25 DIAGNOSIS — E1152 Type 2 diabetes mellitus with diabetic peripheral angiopathy with gangrene: Secondary | ICD-10-CM | POA: Diagnosis not present

## 2022-10-25 DIAGNOSIS — I70245 Atherosclerosis of native arteries of left leg with ulceration of other part of foot: Secondary | ICD-10-CM | POA: Diagnosis not present

## 2022-11-01 DIAGNOSIS — I70245 Atherosclerosis of native arteries of left leg with ulceration of other part of foot: Secondary | ICD-10-CM | POA: Diagnosis not present

## 2022-11-01 DIAGNOSIS — I70244 Atherosclerosis of native arteries of left leg with ulceration of heel and midfoot: Secondary | ICD-10-CM | POA: Diagnosis not present

## 2022-11-01 DIAGNOSIS — L8990 Pressure ulcer of unspecified site, unspecified stage: Secondary | ICD-10-CM | POA: Diagnosis not present

## 2022-11-01 DIAGNOSIS — E1152 Type 2 diabetes mellitus with diabetic peripheral angiopathy with gangrene: Secondary | ICD-10-CM | POA: Diagnosis not present

## 2022-11-02 DIAGNOSIS — E782 Mixed hyperlipidemia: Secondary | ICD-10-CM | POA: Diagnosis not present

## 2022-11-02 DIAGNOSIS — I10 Essential (primary) hypertension: Secondary | ICD-10-CM | POA: Diagnosis not present

## 2022-11-02 DIAGNOSIS — E119 Type 2 diabetes mellitus without complications: Secondary | ICD-10-CM | POA: Diagnosis not present

## 2022-11-03 DIAGNOSIS — I70213 Atherosclerosis of native arteries of extremities with intermittent claudication, bilateral legs: Secondary | ICD-10-CM | POA: Diagnosis not present

## 2022-11-06 ENCOUNTER — Ambulatory Visit: Payer: Medicare Other | Admitting: Internal Medicine

## 2022-11-07 ENCOUNTER — Ambulatory Visit: Payer: Medicare Other | Admitting: Internal Medicine

## 2022-11-07 DIAGNOSIS — M2042 Other hammer toe(s) (acquired), left foot: Secondary | ICD-10-CM | POA: Diagnosis not present

## 2022-11-07 DIAGNOSIS — Z89511 Acquired absence of right leg below knee: Secondary | ICD-10-CM | POA: Diagnosis not present

## 2022-11-07 DIAGNOSIS — L89622 Pressure ulcer of left heel, stage 2: Secondary | ICD-10-CM | POA: Diagnosis not present

## 2022-11-07 DIAGNOSIS — E11621 Type 2 diabetes mellitus with foot ulcer: Secondary | ICD-10-CM | POA: Diagnosis not present

## 2022-11-07 DIAGNOSIS — E1151 Type 2 diabetes mellitus with diabetic peripheral angiopathy without gangrene: Secondary | ICD-10-CM | POA: Diagnosis not present

## 2022-11-07 DIAGNOSIS — I10 Essential (primary) hypertension: Secondary | ICD-10-CM | POA: Diagnosis not present

## 2022-11-07 DIAGNOSIS — I70292 Other atherosclerosis of native arteries of extremities, left leg: Secondary | ICD-10-CM | POA: Diagnosis not present

## 2022-11-08 DIAGNOSIS — I70213 Atherosclerosis of native arteries of extremities with intermittent claudication, bilateral legs: Secondary | ICD-10-CM | POA: Diagnosis not present

## 2022-11-09 DIAGNOSIS — E1152 Type 2 diabetes mellitus with diabetic peripheral angiopathy with gangrene: Secondary | ICD-10-CM | POA: Diagnosis not present

## 2022-11-09 DIAGNOSIS — I70213 Atherosclerosis of native arteries of extremities with intermittent claudication, bilateral legs: Secondary | ICD-10-CM | POA: Diagnosis not present

## 2022-11-09 DIAGNOSIS — I70245 Atherosclerosis of native arteries of left leg with ulceration of other part of foot: Secondary | ICD-10-CM | POA: Diagnosis not present

## 2022-11-09 DIAGNOSIS — E782 Mixed hyperlipidemia: Secondary | ICD-10-CM | POA: Diagnosis not present

## 2022-11-09 DIAGNOSIS — I70244 Atherosclerosis of native arteries of left leg with ulceration of heel and midfoot: Secondary | ICD-10-CM | POA: Diagnosis not present

## 2022-11-09 DIAGNOSIS — I10 Essential (primary) hypertension: Secondary | ICD-10-CM | POA: Diagnosis not present

## 2022-11-11 ENCOUNTER — Other Ambulatory Visit: Payer: Self-pay | Admitting: Internal Medicine

## 2022-11-13 DIAGNOSIS — E1151 Type 2 diabetes mellitus with diabetic peripheral angiopathy without gangrene: Secondary | ICD-10-CM | POA: Diagnosis not present

## 2022-11-13 DIAGNOSIS — I70292 Other atherosclerosis of native arteries of extremities, left leg: Secondary | ICD-10-CM | POA: Diagnosis not present

## 2022-11-13 DIAGNOSIS — M2042 Other hammer toe(s) (acquired), left foot: Secondary | ICD-10-CM | POA: Diagnosis not present

## 2022-11-13 DIAGNOSIS — I10 Essential (primary) hypertension: Secondary | ICD-10-CM | POA: Diagnosis not present

## 2022-11-13 DIAGNOSIS — Z89511 Acquired absence of right leg below knee: Secondary | ICD-10-CM | POA: Diagnosis not present

## 2022-11-13 DIAGNOSIS — E11621 Type 2 diabetes mellitus with foot ulcer: Secondary | ICD-10-CM | POA: Diagnosis not present

## 2022-11-13 DIAGNOSIS — L89622 Pressure ulcer of left heel, stage 2: Secondary | ICD-10-CM | POA: Diagnosis not present

## 2022-11-14 DIAGNOSIS — I70213 Atherosclerosis of native arteries of extremities with intermittent claudication, bilateral legs: Secondary | ICD-10-CM | POA: Diagnosis not present

## 2022-11-14 DIAGNOSIS — E1152 Type 2 diabetes mellitus with diabetic peripheral angiopathy with gangrene: Secondary | ICD-10-CM | POA: Diagnosis not present

## 2022-11-14 DIAGNOSIS — I7025 Atherosclerosis of native arteries of other extremities with ulceration: Secondary | ICD-10-CM | POA: Diagnosis not present

## 2022-11-14 DIAGNOSIS — I70245 Atherosclerosis of native arteries of left leg with ulceration of other part of foot: Secondary | ICD-10-CM | POA: Diagnosis not present

## 2022-11-15 DIAGNOSIS — I10 Essential (primary) hypertension: Secondary | ICD-10-CM | POA: Diagnosis not present

## 2022-11-15 DIAGNOSIS — L8990 Pressure ulcer of unspecified site, unspecified stage: Secondary | ICD-10-CM | POA: Diagnosis not present

## 2022-11-15 DIAGNOSIS — I70244 Atherosclerosis of native arteries of left leg with ulceration of heel and midfoot: Secondary | ICD-10-CM | POA: Diagnosis not present

## 2022-11-15 DIAGNOSIS — I70213 Atherosclerosis of native arteries of extremities with intermittent claudication, bilateral legs: Secondary | ICD-10-CM | POA: Diagnosis not present

## 2022-11-15 DIAGNOSIS — E785 Hyperlipidemia, unspecified: Secondary | ICD-10-CM | POA: Diagnosis not present

## 2022-11-16 ENCOUNTER — Telehealth: Payer: Self-pay | Admitting: Internal Medicine

## 2022-11-16 MED ORDER — HYDROCODONE-ACETAMINOPHEN 10-325 MG PO TABS: 1.0000 | ORAL_TABLET | Freq: Four times a day (QID) | ORAL | 0 refills | Status: DC | PRN

## 2022-11-16 NOTE — Telephone Encounter (Signed)
Prescription Request  11/16/2022  Is this a "Controlled Substance" medicine? Yes  LOV: 08/04/2022  What is the name of the medication or equipment?  HYDROcodone-acetaminophen (NORCO) 10-325 MG tablet   Have you contacted your pharmacy to request a refill? No   [facility] DR[facility] STORE [place], [address] AT [facility] [address] [place] [address] Phone: [phone] Fax: [phone]   Patient notified that their request is being sent to the clinical staff for review and that they should receive a response within 2 business days.   Please advise at [facility] [phone]

## 2022-11-16 NOTE — Telephone Encounter (Signed)
Requesting: hydrocodone 10-'325mg'$   Contract:02/27/22 UDS: 02/27/22 Last Visit: 08/04/22 Next Visit: 11/21/22 Last Refill: 09/20/22 #120 and 0RF   Please Advise

## 2022-11-16 NOTE — Telephone Encounter (Signed)
PDMP okay, Rx sent 

## 2022-11-19 NOTE — Progress Notes (Signed)
Office Visit Note  Patient: [name]             Date of Birth: [birth year]           MRN: [id number]             PCP: Colon Branch, MD Referring: Colon Branch, MD Visit Date: 11/20/2022   Subjective:  New Patient (Initial Visit)   History of Present Illness: [name] is a 88 y.o. female here for evaluation of possible PMR. She has been treated with prednisone due to increased pain particularly worsening of shoulder pain with prednisone tapering below 10 mg prednisone daily. Serum inflammatory markers have been normal despite symptoms. She has now been off the prednisone for a few months. It was unclear how much difference in symptoms she still saw with use of the prednisone recently. Her shoulder range of motion and strength remains fairly poor. Does not get much better over the course of each day. She also has more trouble with both hands pain at rest and with use. Does get some swelling usually pretty diffuse throughout the hands when present. Taking hydrocodone and tylenol partially helpful. Tramadol and gabapentin helped but worsened leg swelling.  Her son is present helping out with transportation and providing some collateral history.  Activities of Daily Living:  Patient reports morning stiffness for     .   Patient Reports nocturnal pain.  Difficulty dressing/grooming: Denies Difficulty climbing stairs: Reports Difficulty getting out of chair: Reports Difficulty using hands for taps, buttons, cutlery, and/or writing: Reports  Review of Systems  Constitutional:  Positive for fatigue.  HENT:  Negative for mouth sores and mouth dryness.   Eyes:  Positive for dryness.  Respiratory:  Negative for shortness of breath.   Cardiovascular:  Negative for chest pain and palpitations.  Gastrointestinal:  Positive for blood in stool and diarrhea. Negative for constipation.  Endocrine: Negative for increased urination.  Genitourinary:  Negative for involuntary urination.   Musculoskeletal:  Positive for joint pain, joint pain, myalgias and myalgias. Negative for gait problem, joint swelling, muscle weakness, morning stiffness and muscle tenderness.  Skin:  Negative for color change, rash, hair loss and sensitivity to sunlight.  Allergic/Immunologic: Negative for susceptible to infections.  Neurological:  Positive for headaches. Negative for dizziness.  Hematological:  Negative for swollen glands.  Psychiatric/Behavioral:  Negative for depressed mood and sleep disturbance. The patient is not nervous/anxious.     PMFS History:  Patient Active Problem List   Diagnosis Date Noted   Bilateral shoulder pain 11/20/2022   Bilateral hand pain 11/20/2022   IDA (iron deficiency anemia) 04/11/2022   Dementia ([place]) 10/30/2021   Chronic migraine without aura, with intractable migraine, so stated, with status migrainosus 08/17/2021   Cervical myelopathy ([place]) 11/29/2020   Chronic idiopathic constipation 11/12/2020   Lumbar compression fracture ([place]) 09/29/2020   Other spondylosis with radiculopathy, cervical region 09/29/2020   External hemorrhoids 02/15/2018   History of three vessel coronary artery bypass - 2017 , in [facility] 09/25/2017   PVD (peripheral vascular disease) ([place])-- stent L leg 06-2017 Dr Feliberto [facility] 07/23/2017   Phantom pain ([place]) 04/01/2017   Constipation, chronic 04/01/2017   S/P unilateral BKA (below knee amputation), right ([place]) 12/14/2016   Toe osteomyelitis, left ([place]) 11/17/2016   Acute on chronic diastolic CHF (congestive heart failure), NYHA class 3 ([place]) 03/30/2016   Coronary artery disease due to lipid rich plaque 03/30/2016   Acute renal failure superimposed on stage  3 chronic kidney disease ([place]) 03/30/2016   S/P below knee amputation ([place]) 02/18/2016   Memory loss 11/26/2015   PCP NOTES >>>>>>>>>>>>>>>>>>>> 10/18/2015   S/P partial colectomy 01/08/2015   Diarrhea 01/29/2014   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 01/28/2014    Anemia 01/18/2014   CAD (coronary artery disease)    Carcinoma in situ in a polyp    Depression 09/17/2013   Charcot's joint 08/21/2013   Foot osteomyelitis, right ([place]) 07/16/2013   Chronic pain associated with significant psychosocial dysfunction 05/06/2013   Neuropathy    Hyperthyroidism    Lower extremity edema 03/08/2011   Annual physical exam 03/08/2011   Tachycardia 12/07/2010   ALLERGIC RHINITIS 04/15/2010   ORTHOSTATIC DIZZINESS 04/13/2010   HIATAL HERNIA 12/06/2009   GASTRIC ULCER, HX OF 12/06/2009   DYSPNEA 03/18/2009   DEGENERATIVE JOINT DISEASE 06/03/2008   DIVERTICULOSIS, COLON 01/27/2008   IRRITABLE BOWEL SYNDROME, HX OF 01/27/2008   Pulmonary nodule, right 01/22/2008   Hyperlipidemia 12/30/2007   Osteoporosis 12/30/2007   TB SKIN TEST, POSITIVE 08/09/2007   DM type 2 with diabetic peripheral neuropathy ([place]) 05/09/2007   Reflex sympathetic dystrophy-- PAIN MNGMT  05/09/2007   Essential hypertension 04/15/2007   GERD 04/15/2007    Past Medical History:  Diagnosis Date   Allergic rhinitis    Arthritis    "back; ankles; hands; knees" (10/31/2013)   Bilateral sensorineural hearing loss    CAD (coronary artery disease)    a. Nonobst in 2011. b. Abnormal nuc 09/2013 -> s/p cutting balloon to D2, mild LAD disease; c. 08/2015 MV: no ischemia, EF 71%.   Carcinoma in situ in a polyp 1994   a. 1994 - malignant polyp removed during colonoscopy.   Chronic diastolic CHF (congestive heart failure) ([place])    a. 09/2013 Echo: EF 60-65%, mild LVH, Gr1 DD.   Dementia ([place])    Diverticulosis    DJD (degenerative joint disease)    Fatty liver    GERD (gastroesophageal reflux disease)    a. Hx GERD/esophageal dysmotility followed by Dr. Olevia Perches.    Hemorrhoids    Hiatal hernia    a. s/p Nissen fundoplication [id number].   Hyperlipidemia    a. patient unwilling to use statins.   Hypertensive heart disease    IBS (irritable bowel syndrome)    Macular degeneration 03/2009   Dr.  Rosana Hoes   Neuropathy    a. Hands, feet, legs.   Orthostatic hypotension    Osteomyelitis (H[facility])    a. 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 01/2016 s/p RLE transtibial amputation 2/2 Charcot rocker-bottom deformity and insensate neuropathy ulceration.   Osteoporosis    PE (pulmonary embolism) 12/2007   a. PE/DVT after neck surgery 2009. b. coumadin d/c 10-2008.   PONV (postoperative nausea and vomiting) 2009   neck surgery   PPD positive    Recurrent UTI    RSD (reflex sympathetic dystrophy)    a. Chronic pain.   Spinal stenosis    Type II diabetes mellitus ([place])    no on medication   Venous insufficiency    a. Contributing to LEE.    Family History  Problem Relation Age of Onset   Heart disease Mother        mitral valve replaced   Arthritis Mother    Diabetic kidney disease Daughter    Diabetes Father    Stroke Father    Migraines Sister    Hypertension Other  Breast cancer Other    Colon cancer Neg Hx    Esophageal cancer Neg Hx    Stomach cancer Neg Hx    Rectal cancer Neg Hx    Past Surgical History:  Procedure Laterality Date   AMPUTATION  03/06/2012   Procedure: AMPUTATION FOOT;  Surgeon: Newt Minion, MD;  Location: [place];  Service: Orthopedics;  Laterality: Left;  FIFTH RAY AMPUTATION    AMPUTATION Right 02/18/2016   Procedure: AMPUTATION BELOW KNEE;  Surgeon: Newt Minion, MD;  Location: [place];  Service: Orthopedics;  Laterality: Right;   AMPUTATION Left 11/22/2016   Procedure: Amputation 4th Toe Left Foot at Metatarsophalangeal Joint;  Surgeon: Newt Minion, MD;  Location: [place];  Service: Orthopedics;  Laterality: Left;   ANKLE FUSION  09/27/2012   Procedure: ANKLE FUSION;  Surgeon: Newt Minion, MD;  Location: [place];  Service: Orthopedics;  Laterality: Left;  Left Tibiocalcaneal Fusion   ANKLE FUSION Right 05/09/2013   Procedure: ANKLE FUSION;  Surgeon: Newt Minion, MD;  Location: [place];  Service:  Orthopedics;  Laterality: Right;  Excision Osteomyelitis Base 1st MT Right Foot, Fusion Medial Column   ANTERIOR CERVICAL DECOMP/DISCECTOMY FUSION  12/18/07   For OA,  Dr. Lorin Mercy:  fu by a PE   ANTERIOR CERVICAL DECOMP/DISCECTOMY FUSION N/A 11/29/2020   Procedure: Anterior Cervical Decompression Fusion - Cervical Three-Cerivcal Four;  Surgeon: Earnie Larsson, MD;  Location: [place];  Service: Neurosurgery;  Laterality: N/A;  3C   CARDIAC CATHETERIZATION  05/2010    at [place] Left 09/2011   CATARACT EXTRACTION W/ INTRAOCULAR LENS  IMPLANT, BILATERAL  2004   feb 2004 left, aug 2004 right   CHOLECYSTECTOMY  03/2002   CORONARY ANGIOPLASTY  10/31/2013   CORONARY ARTERY BYPASS GRAFT  09/27/2016   CYST REMOVAL HAND  06/2003   FOOT BONE EXCISION Right 06/2009   LAPAROSCOPIC RIGHT HEMI COLECTOMY N/A 01/08/2015   Procedure: LAPAROSCOPIC ASSISTED RIGHT HEMI COLECTOMY;  Surgeon: Johnathan Hausen, MD;  Location: [facility];  Service: General;  Laterality: N/A;   LEFT HEART CATHETERIZATION WITH CORONARY ANGIOGRAM N/A 10/31/2013   Procedure: LEFT HEART CATHETERIZATION WITH CORONARY ANGIOGRAM;  Surgeon: Jettie Booze, MD;  [facility] C[facility];  Service: Cardiovascular;  Laterality: N/A;   NASAL SEPTUM SURGERY  0000000   NISSEN FUNDOPLICATION  [id number]   PERCUTANEOUS CORONARY INTERVENTION-BALLOON ONLY  10/31/2013   Procedure: PERCUTANEOUS CORONARY INTERVENTION-BALLOON ONLY;  Surgeon: Jettie Booze, MD;  Location: [facility] CATH LAB;  Service: Cardiovascular;;   ROTATOR CUFF REPAIR Right 07/2007   Dr. Morene Rankins CUFF REPAIR Left 11/2004   stent in left leg, behind knee  06/27/2017   x2, done at [facility] in [place] Right 26-Sep-2008   3rd toe, Dr. Sharol Given due to osteomyelitis   VAGINAL HYSTERECTOMY  03/1975   VEIN LIGATION Bilateral 03/1966   VENA CAVA FILTER PLACEMENT  01/2010   green filter; "due to blood clots"   [place] EXTRACTION  06/2007   Social  History   Social History Narrative   Daughter [name] deceased 09/26/2020   daughter lives in town   1 son in [place], [place] Cuff comes home every 4 weeks to visit   Caffeine use:  Tea 1/day w/ dinner   Coffee occass          Currently lives in [place] with son Abbagail Hilinski.   Immunization History  Administered Date(s) Administered   Fluad Quad(high Dose 65+) 07/11/2019, 09/22/2020, 07/27/2021, 07/03/2022   Influenza Split 08/07/2012   Influenza Whole 07/22/2008, 07/26/2009   Influenza, High Dose Seasonal PF 10/15/2015, 09/18/2016, 07/23/2017, 08/21/2018   Influenza, Seasonal, Injecte, Preservative Fre 07/23/2013   Influenza,inj,Quad PF,6+ Mos 10/06/2014   PFIZER(Purple Top)SARS-COV-2 Vaccination 12/27/2019, 01/31/2020, 11/01/2020   PNEUMOCOCCAL CONJUGATE-20 07/03/2022   Pneumococcal Conjugate-13 10/06/2014   Pneumococcal Polysaccharide-23 09/28/2010   Tetanus 09/17/2013   Zoster Recombinat (Shingrix) 10/11/2018, 12/12/2018   Zoster, Live 09/18/2011     Objective: Vital Signs: BP 135/76 (BP Location: Left Arm, Patient Position: Sitting, Cuff Size: Normal)   Pulse 77   Wt 270 lb (122.5 kg) Comment: pt in wheelchair  BMI 46.35 kg/m    Physical Exam Constitutional:      Appearance: She is obese.     Comments: In wheelchair  Eyes:     Conjunctiva/sclera: Conjunctivae normal.  Cardiovascular:     Rate and Rhythm: Normal rate and regular rhythm.  Pulmonary:     Effort: Pulmonary effort is normal.     Breath sounds: Normal breath sounds.  Skin:    General: Skin is warm and dry.     Comments: Bruising extensively extending around left side at hip and at lower costal border levels  Neurological:     Mental Status: She is alert.      Musculoskeletal Exam:  Right worse than left shoulder pain with active and passive ROM, worse active, tenderness to pressure lateral, anterior, and superior sides of joint, weak abduction or cross arm motion Elbows full ROM no tenderness or  swelling Wrists full ROM no tenderness or swelling Fingers full ROM no tenderness or swelling slightly reduced 3rd PIP flexion ROM no palpable synovitis, some palpable flexor tendon nodules worst right hand between 2nd-3rd MCPs Right leg BKA, left foot in book diffuse pitting edema in distal left leg   Investigation: No additional findings.  Imaging: No results found.  Recent Labs: Lab Results  Component Value Date   WBC 3.7 (L) 11/21/2022   HGB 9.5 (L) 11/21/2022   PLT 186.0 11/21/2022   NA 140 11/21/2022   K 3.8 11/21/2022   CL 110 11/21/2022   CO2 22 11/21/2022   GLUCOSE 88 11/21/2022   BUN 16 11/21/2022   CREATININE 0.91 11/21/2022   BILITOT 0.3 04/11/2022   ALKPHOS 39 04/11/2022   AST 10 (L) 04/11/2022   ALT 7 04/11/2022   PROT 5.7 (L) 04/11/2022   ALBUMIN 3.3 (L) 04/11/2022   CALCIUM 8.1 (L) 11/21/2022   GFRAA 17 09/03/2020    Speciality Comments: No specialty comments available.  Procedures:  No procedures performed Allergies: Metformin and related, Cymbalta [duloxetine hcl], Gabapentin, Silicone, Tramadol, Adhesive [tape], Cefazolin, Ciprofloxacin, Clorazepate dipotassium, Dilaudid [hydromorphone hcl], Doxycycline, Enablex [darifenacin hydrobromide er], Levofloxacin, Lyrica [pregabalin], Methadone hcl, Morphine and related, and Talwin [pentazocine]   Assessment / Plan:     Visit Diagnoses: Chronic pain of both shoulders - Plan: Sedimentation rate, C-reactive protein, Rheumatoid factor, Cyclic citrul peptide antibody, IgG  Chronic pain and stiffness but does not appear highly typical of PMR. Active and passive ROM similarly limited and decreased strength on exam, xrays with orthopedics clinic reviewed of right shoulder consistent with chronic rotator cuff pathology. She does have have concerning signs for GCA. Will check serum inflammatory markers if these are normal would be highly reassuring, if high can consider trial of steroids again with  reassessment.  Bilateral hand pain  Do not see any peripheral  joint synovitis on exam today. She does have some flexor tendon nodules or very early contractures but I don't think these are a primary cause of pain in the area. Checking RF and CCP Abs but low initial suspicion for RA.  Reflex sympathetic dystrophy-- PAIN MNGMT   Unfortunately with chronic pain related to peripheral vascular disease and previous amputation. May also be contributing to pain sensitization.  Orders: Orders Placed This Encounter  Procedures   Sedimentation rate   C-reactive protein   Rheumatoid factor   Cyclic citrul peptide antibody, IgG   No orders of the defined types were placed in this encounter.    Follow-Up Instructions: No follow-ups on file.   Collier Salina, MD  Note - This record has been created using [employer].  Chart creation errors have been sought, but may not always  have been located. Such creation errors do not reflect on  the standard of medical care.

## 2022-11-20 ENCOUNTER — Ambulatory Visit: Payer: Medicare Other | Attending: Internal Medicine | Admitting: Internal Medicine

## 2022-11-20 ENCOUNTER — Encounter: Payer: Self-pay | Admitting: Internal Medicine

## 2022-11-20 VITALS — BP 135/76 | HR 77 | Wt 270.0 lb

## 2022-11-20 DIAGNOSIS — M79642 Pain in left hand: Secondary | ICD-10-CM | POA: Insufficient documentation

## 2022-11-20 DIAGNOSIS — M79641 Pain in right hand: Secondary | ICD-10-CM | POA: Diagnosis not present

## 2022-11-20 DIAGNOSIS — G9050 Complex regional pain syndrome I, unspecified: Secondary | ICD-10-CM | POA: Insufficient documentation

## 2022-11-20 DIAGNOSIS — G8929 Other chronic pain: Secondary | ICD-10-CM | POA: Diagnosis not present

## 2022-11-20 DIAGNOSIS — M25512 Pain in left shoulder: Secondary | ICD-10-CM | POA: Diagnosis not present

## 2022-11-20 DIAGNOSIS — M25511 Pain in right shoulder: Secondary | ICD-10-CM | POA: Insufficient documentation

## 2022-11-21 ENCOUNTER — Ambulatory Visit (INDEPENDENT_AMBULATORY_CARE_PROVIDER_SITE_OTHER): Payer: Medicare Other | Admitting: Internal Medicine

## 2022-11-21 ENCOUNTER — Encounter: Payer: Self-pay | Admitting: Internal Medicine

## 2022-11-21 VITALS — BP 126/82 | HR 87 | Temp 97.9°F | Resp 18 | Ht 64.0 in | Wt 174.2 lb

## 2022-11-21 DIAGNOSIS — Z79899 Other long term (current) drug therapy: Secondary | ICD-10-CM

## 2022-11-21 DIAGNOSIS — F03B0 Unspecified dementia, moderate, without behavioral disturbance, psychotic disturbance, mood disturbance, and anxiety: Secondary | ICD-10-CM

## 2022-11-21 DIAGNOSIS — I10 Essential (primary) hypertension: Secondary | ICD-10-CM | POA: Diagnosis not present

## 2022-11-21 DIAGNOSIS — I739 Peripheral vascular disease, unspecified: Secondary | ICD-10-CM | POA: Diagnosis not present

## 2022-11-21 DIAGNOSIS — R1031 Right lower quadrant pain: Secondary | ICD-10-CM

## 2022-11-21 DIAGNOSIS — G894 Chronic pain syndrome: Secondary | ICD-10-CM

## 2022-11-21 DIAGNOSIS — E1142 Type 2 diabetes mellitus with diabetic polyneuropathy: Secondary | ICD-10-CM | POA: Diagnosis not present

## 2022-11-21 LAB — BASIC METABOLIC PANEL
BUN: 16 mg/dL (ref 6–23)
CO2: 22 mEq/L (ref 19–32)
Calcium: 8.1 mg/dL — ABNORMAL LOW (ref 8.4–10.5)
Chloride: 110 mEq/L (ref 96–112)
Creatinine, Ser: 0.91 mg/dL (ref 0.40–1.20)
GFR: 57.55 mL/min — ABNORMAL LOW (ref >60.00)
Glucose, Bld: 88 mg/dL (ref 70–99)
Potassium: 3.8 mEq/L (ref 3.5–5.1)
Sodium: 140 mEq/L (ref 135–145)

## 2022-11-21 LAB — CBC WITH DIFFERENTIAL/PLATELET
Basophils Absolute: 0 10*3/uL (ref 0.0–0.1)
Basophils Relative: 0.3 % (ref 0.0–3.0)
Eosinophils Absolute: 0.1 10*3/uL (ref 0.0–0.7)
Eosinophils Relative: 2.9 % (ref 0.0–5.0)
HCT: 29.6 % — ABNORMAL LOW (ref 36.0–46.0)
Hemoglobin: 9.5 g/dL — ABNORMAL LOW (ref 12.0–15.0)
Lymphocytes Relative: 27.2 % (ref 12.0–46.0)
Lymphs Abs: 1 10*3/uL (ref 0.7–4.0)
MCHC: 31.9 g/dL (ref 30.0–36.0)
MCV: 96.4 fl (ref 78.0–100.0)
Monocytes Absolute: 0.2 10*3/uL (ref 0.1–1.0)
Monocytes Relative: 5.6 % (ref 3.0–12.0)
Neutro Abs: 2.4 10*3/uL (ref 1.4–7.7)
Neutrophils Relative %: 64 % (ref 43.0–77.0)
Platelets: 186 10*3/uL (ref 150.0–400.0)
RBC: 3.07 Mil/uL — ABNORMAL LOW (ref 3.87–5.11)
RDW: 17.3 % — ABNORMAL HIGH (ref 11.5–15.5)
WBC: 3.7 10*3/uL — ABNORMAL LOW (ref 4.0–10.5)

## 2022-11-21 NOTE — Progress Notes (Signed)
Subjective:    Patient ID: [name], female    DOB: 1935-04-14, 88 y.o.   MRN: [id number]  DOS:  11/21/2022 Type of visit - description: f/u  Here with her son. Apparently early in January she developed left foot infection, that triggered the need for a  revascularization procedure on the left leg. The procedure took place 11/08/2022,  Dr. Docia Barrier.  They went to the left leg via the right groin. Apparently the procedure was successful.  Today the patient reports the following: -The right arm pain is chronic but is slightly worse lately.  It encompasses the whole right arm.  No recent fall or neck injury.  No swelling. -  right abdominal pain started after the revascularization procedure.  She denies fever or chills No nausea vomiting. No diarrhea or blood in the stools. No chest pain no difficulty breathing She had fairly extensive ecchymoses at the right hip and right back shortly after the revascularization.    Review of Systems See above   Past Medical History:  Diagnosis Date   Allergic rhinitis    Arthritis    "back; ankles; hands; knees" (10/31/2013)   Bilateral sensorineural hearing loss    CAD (coronary artery disease)    a. Nonobst in 2011. b. Abnormal nuc 09/2013 -> s/p cutting balloon to D2, mild LAD disease; c. 08/2015 MV: no ischemia, EF 71%.   Carcinoma in situ in a polyp 1994   a. 1994 - malignant polyp removed during colonoscopy.   Chronic diastolic CHF (congestive heart failure) (HCC)    a. 09/2013 Echo: EF 60-65%, mild LVH, Gr1 DD.   Dementia (HCC)    Diverticulosis    DJD (degenerative joint disease)    Fatty liver    GERD (gastroesophageal reflux disease)    a. Hx GERD/esophageal dysmotility followed by Dr. Juanda Chance.    Hemorrhoids    Hiatal hernia    a. s/p Nissen fundoplication 2011.   Hyperlipidemia    a. patient unwilling to use statins.   Hypertensive heart disease    IBS (irritable bowel syndrome)    Macular degeneration 03/2009   Dr.  Earlene Plater   Neuropathy    a. Hands, feet, legs.   Orthostatic hypotension    Osteomyelitis (HCC)    a. 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 01/2016 s/p RLE transtibial amputation 2/2 Charcot rocker-bottom deformity and insensate neuropathy ulceration.   Osteoporosis    PE (pulmonary embolism) 12/2007   a. PE/DVT after neck surgery 2009. b. coumadin d/c 10-2008.   PONV (postoperative nausea and vomiting) 2009   neck surgery   PPD positive    Recurrent UTI    RSD (reflex sympathetic dystrophy)    a. Chronic pain.   Spinal stenosis    Type II diabetes mellitus (HCC)    no on medication   Venous insufficiency    a. Contributing to LEE.    Past Surgical History:  Procedure Laterality Date   AMPUTATION  03/06/2012   Procedure: AMPUTATION FOOT;  Surgeon: Nadara Mustard, MD;  Location: [facility] OR;  Service: Orthopedics;  Laterality: Left;  FIFTH RAY AMPUTATION    AMPUTATION Right 02/18/2016   Procedure: AMPUTATION BELOW KNEE;  Surgeon: Nadara Mustard, MD;  Location: [facility] OR;  Service: Orthopedics;  Laterality: Right;   AMPUTATION Left 11/22/2016   Procedure: Amputation 4th Toe Left Foot at Metatarsophalangeal Joint;  Surgeon: Nadara Mustard, MD;  Location: [facility] OR;  Service:  Orthopedics;  Laterality: Left;   ANKLE FUSION  09/27/2012   Procedure: ANKLE FUSION;  Surgeon: Nadara Mustard, MD;  Location: [facility] OR;  Service: Orthopedics;  Laterality: Left;  Left Tibiocalcaneal Fusion   ANKLE FUSION Right 05/09/2013   Procedure: ANKLE FUSION;  Surgeon: Nadara Mustard, MD;  Location: [facility] OR;  Service: Orthopedics;  Laterality: Right;  Excision Osteomyelitis Base 1st MT Right Foot, Fusion Medial Column   ANTERIOR CERVICAL DECOMP/DISCECTOMY FUSION  12/18/07   For OA,  Dr. Ophelia Charter:  fu by a PE   ANTERIOR CERVICAL DECOMP/DISCECTOMY FUSION N/A 11/29/2020   Procedure: Anterior Cervical Decompression Fusion - Cervical Three-Cerivcal Four;  Surgeon: Julio Sicks, MD;  Location:  [facility] OR;  Service: Neurosurgery;  Laterality: N/A;  3C   CARDIAC CATHETERIZATION  05/2010    at [facility] TUNNEL RELEASE Left 09/2011   CATARACT EXTRACTION W/ INTRAOCULAR LENS  IMPLANT, BILATERAL  2004   feb 2004 left, aug 2004 right   CHOLECYSTECTOMY  03/2002   CORONARY ANGIOPLASTY  10/31/2013   CORONARY ARTERY BYPASS GRAFT  09/27/2016   CYST REMOVAL HAND  06/2003   FOOT BONE EXCISION Right 06/2009   LAPAROSCOPIC RIGHT HEMI COLECTOMY N/A 01/08/2015   Procedure: LAPAROSCOPIC ASSISTED RIGHT HEMI COLECTOMY;  Surgeon: Luretha Murphy, MD;  Location: [facility] ORS;  Service: General;  Laterality: N/A;   LEFT HEART CATHETERIZATION WITH CORONARY ANGIOGRAM N/A 10/31/2013   Procedure: LEFT HEART CATHETERIZATION WITH CORONARY ANGIOGRAM;  Surgeon: Corky Crafts, MD;  Location: [facility] CATH LAB;  Service: Cardiovascular;  Laterality: N/A;   NASAL SEPTUM SURGERY  09/1963   NISSEN FUNDOPLICATION  01/25/2010   PERCUTANEOUS CORONARY INTERVENTION-BALLOON ONLY  10/31/2013   Procedure: PERCUTANEOUS CORONARY INTERVENTION-BALLOON ONLY;  Surgeon: Corky Crafts, MD;  Location: [facility] CATH LAB;  Service: Cardiovascular;;   ROTATOR CUFF REPAIR Right 07/2007   Dr. Gwenith Spitz CUFF REPAIR Left 11/2004   stent in left leg, behind knee  06/27/2017   x2, done at [facility] cardiology in [facility]   TOE AMPUTATION Right 08/2008   3rd toe, Dr. Lajoyce Corners due to osteomyelitis   VAGINAL HYSTERECTOMY  03/1975   VEIN LIGATION Bilateral 03/1966   VENA CAVA FILTER PLACEMENT  01/2010   green filter; "due to blood clots"   WISDOM TOOTH EXTRACTION  06/2007    Current Outpatient Medications  Medication Instructions   Alpha-Lipoic Acid 1,200 mg, Oral, Daily   amitriptyline (ELAVIL) 25 mg, Oral, Daily at bedtime   atorvastatin (LIPITOR) 40 mg, Oral, Daily   carvedilol (COREG) 12.5 mg, Oral, 2 times daily with meals   CINNAMON PO 2,000 mg, Oral, Daily   clopidogrel (PLAVIX) 75 mg, Oral, Daily at bedtime, Restart on 12/04/2020    Cranberry 200 MG CAPS    dextromethorphan-guaiFENesin (TUSSIN DM) 10-100 MG/5ML liquid Every 4 hours PRN   diclofenac Sodium (VOLTAREN) 4 g, Topical, 4 times daily   dicyclomine (BENTYL) 10 MG capsule TAKE 1 CAPSULE THREE TIMES A DAY BEFORE MEALS (NEED APPOINTMENT)   donepezil (ARICEPT) 10 mg, Oral, Daily at bedtime   famotidine (PEPCID) 20 mg, Oral, 2 times daily   furosemide (LASIX) 20 mg, Oral, Daily   glucose blood (FREESTYLE LITE) test strip USE TO CHECK BLOOD SUGAR NO MORE THAN TWICE DAILY   HYDROcodone-acetaminophen (NORCO) 10-325 MG tablet 1 tablet, Oral, Every 6 hours PRN   hydrocortisone (ANUSOL-HC) 2.5 % rectal cream 1 Application, Rectal, 2 times daily, As needed   hydrocortisone (ANUSOL-HC) 25 mg, Rectal,  2 times daily, As needed   ketoconazole (NIZORAL) 2 % cream 1 application , Topical, Daily   Lancets (FREESTYLE) lancets CHECK BLOOD SUGAR TWICE DAILY   linaclotide (LINZESS) 72 mcg, Oral, Daily before breakfast   loperamide (IMODIUM A-D) 2 mg, Oral, As needed   MegaRed Omega-3 Krill Oil 500 MG CAPS Restart on 12/04/2020   methenamine (HIPREX) 1 g tablet 0.5 tablets, Oral, Daily   Multiple Vitamins-Minerals (PRESERVISION AREDS 2) CAPS 1 capsule, Oral, 2 times daily   naloxone (NARCAN) nasal spray 4 mg/0.1 mL 1 spray, Nasal, Once PRN   nitroGLYCERIN (NITROSTAT) 0.4 mg, Sublingual, Every 5 min x3 PRN   omeprazole (PRILOSEC) 20 MG capsule TAKE 1 CAPSULE TWICE A DAY BEFORE MEALS   ondansetron (ZOFRAN) 4 MG tablet TAKE 1 TABLET(4 MG) BY MOUTH EVERY 8 HOURS AS NEEDED FOR NAUSEA OR VOMITING   OVER THE COUNTER MEDICATION 1 tablet, 3 times daily, Berberorine Gluco Defense    Polyvinyl Alcohol-Povidone (REFRESH OP) 1 drop, Both Eyes, 3 times daily PRN   pramipexole (MIRAPEX) 0.125-0.25 mg, Oral, At bedtime PRN   Probiotic Product (PROBIOTIC PEARLS PO) 1 tablet, Oral, Daily   rivaroxaban (XARELTO) 2.5 mg, Oral, 2 times daily, Restart on 12/04/2020   silver sulfADIAZINE (SILVADENE) 1 %  cream 1 application , Topical, Daily   Vitamin B-12 5,000 mcg, Sublingual, Daily   vitamin C with rose hips 500 mg, Oral, 2 times daily   Vitamin D3 5,000 Units, Oral, Every evening, Reported on 11/24/2015   vitamin E 400 Units, Oral, Daily   Zinc Sulfate 66 MG TABS        Objective:   Physical Exam Skin:         Comments: Has some ecchymosis around the right hip.  No TTP.    BP 126/82   Pulse 87   Temp 97.9 F (36.6 C) (Oral)   Resp 18   Ht 5\' 4"  (1.626 m)   Wt 174 lb 4 oz (79 kg)   SpO2 98%   BMI 29.91 kg/m  General:   Well developed, NAD, BMI noted. HEENT:  Normocephalic . Face symmetric, atraumatic Lungs:  CTA B Normal respiratory effort, no intercostal retractions, no accessory muscle use. Heart: RRR,  no murmur.  Abdomen: Soft, not distended, slightly TTP at the right side of the abdomen. Skin: Multiple ecchymoses with no hematomas that I can tell.  See graphic. Right groin: No mass, pulsatile mass or tenderness that I can tell. Neurologic:  alert & oriented X3. Speech normal  Psych--  Cognition and judgment appear intact.  Cooperative with normal attention span and concentration.  Behavior appropriate. No anxious or depressed appearing.      Assessment     ASSESSMENT DM, Neuropathy, amputations due to Charcot feet. - metformin intolerant  (lethargic) HTN  Hyperlipidemia LUNG MASS -incidental per CT 2009,  PET scan 01-2008: likely  benign, CT 08-2009 no change, no further CTs per Dr Shelle Iron - Had a CT in [place] 09-2016 "spiculated mass", saw Dr Delton Coombes,  CT  09/2017 stable CV: ---CAD cardiologist in [facility] --NSTEMI 09-2016. Outside hospital: Cath 2 vessel disease, CABG 2, LIMA to LAD and Diag   ---Chronic diastolic CHF ---PVD: Aortogram and a stent L Leg Dr Docia Barrier, 06-27-2017 @ [place] (per pt) GI: GERD, IBS, colon polyps, PUD, abdominal pain "post polypectomy syndrome" naueas meds prn (antivert-zofran, gets nausea when car traveling) Osteoporosis   MSK,pain mngmt : --Reflex sympathetic dystrophy --Neuropathy --DJD --Spinal stenosis  --H/o Osteomyelitis  Foot --  s/p amputation:  4th 5th L toes , R BKA  (01-2016, dx osteomyelitis) w/ phantom pain  -- sees Dr Lajoyce Corners prn She took oxycodone after surgery and that did NOT seem to help better than hydrocodone. Intolerant to Cymbalta, Neurontin ; Lyrica helped but caused edema. Recurrent UTIs: Dr Mena Goes  Venous insufficiency Dementia: MMSE 23 (January 2023) H/o  + PPD H/o hypothyroidism: TSHs stable H/o dyspnea   PLAN:  Right abdominal pain: Patient had a revascularization via the right femoral artery 11/08/2022, now reports soreness at the right lower quadrant of the abdomen.  Apparently she had a very large ecchymosis after the procedure but that is better. My concern is a possible complication from the procedure (internal bleeding), there is no obvious hematoma at the groin, the ecchymosis is getting better. The day after the procedure hemoglobin was 8.5, white count 5.8, platelets 158. Plan: Check labs.  If she has significant anemia (below 7.0) will refer to the ER.  If hemoglobin is close to 8.5, then there would be evidence of bleeding. Nevertheless, if she has severe pain or swelling anywhere around the right groin recommend ER. PVD: See above MSK: Ongoing pain at the right arm, right hand. HTN: BMP and CBC DJD, question of PMR: Saw rheumatology yesterday.  Labs were negative Left foot infection: Managed elsewhere  Dementia: pt's son reports memory has been slightly worse since the beginning of the year when she was told she had infection in the foot.  On my exam today she seems at baseline.  We  both agree is not a good idea to need to add  another medication. RTC 2 to 3 months

## 2022-11-21 NOTE — Patient Instructions (Addendum)
If you develop severe pain or swelling at the right groin or abdomen: Go to the ER   GO TO THE LAB : Get the blood work     Murphy, Esmond back for checkup in 2 to 3 months.     Vaccines I recommend:  Covid booster Tdap (tetanus) RSV vaccine  Per our records you are due for your diabetic eye exam. Please contact your eye doctor to schedule an appointment. Please have them send copies of your office visit notes to [place]. Our fax number is [phone]. If you need a referral to an eye doctor please let us know.

## 2022-11-22 ENCOUNTER — Telehealth: Payer: Self-pay | Admitting: Internal Medicine

## 2022-11-22 LAB — MICROALBUMIN / CREATININE URINE RATIO
Creatinine,U: 84.2 mg/dL
Microalb Creat Ratio: 2.9 mg/g (ref 0.0–30.0)
Microalb, Ur: 2.4 mg/dL — ABNORMAL HIGH (ref 0.0–1.9)

## 2022-11-22 LAB — RHEUMATOID FACTOR: Rheumatoid fact SerPl-aCnc: <14 IU/mL (ref <14)

## 2022-11-22 LAB — C-REACTIVE PROTEIN: CRP: 0.8 mg/L (ref <8.0)

## 2022-11-22 LAB — SEDIMENTATION RATE: Sed Rate: 6 mm/h (ref 0–30)

## 2022-11-22 LAB — CYCLIC CITRUL PEPTIDE ANTIBODY, IGG: Cyclic Citrullin Peptide Ab: <16 UNITS

## 2022-11-22 NOTE — Assessment & Plan Note (Signed)
Right abdominal pain: Patient had a revascularization via the right femoral artery 11/08/2022, now reports soreness at the right lower quadrant of the abdomen.  Apparently she had a very large ecchymosis after the procedure but that is better. My concern is a possible complication from the procedure (internal bleeding), there is no obvious hematoma at the groin, the ecchymosis is getting better. The day after the procedure hemoglobin was 8.5, white count 5.8, platelets 158. Plan: Check labs.  If she has significant anemia (below 7.0) will refer to the ER.  If hemoglobin is close to 8.5, then there would be evidence of bleeding. Nevertheless, if she has severe pain or swelling anywhere around the right groin recommend ER. PVD: See above MSK: Ongoing pain at the right arm, right hand. HTN: BMP and CBC DJD, question of PMR: Saw rheumatology yesterday.  Labs were negative Left foot infection: Managed elsewhere  Dementia: pt's son reports memory has been slightly worse since the beginning of the year when she was told she had infection in the foot.  On my exam today she seems at baseline.  We  both agree is not a good idea to need to add  another medication. RTC 2 to 3 months

## 2022-11-22 NOTE — Telephone Encounter (Signed)
Spoke w/ [name]- informed that Pt lives in [place], [place] w/ her son- they do go back and forth. [name] verbalized understanding.

## 2022-11-22 NOTE — Telephone Encounter (Signed)
Thao, with [facility] in [facility], advised that patient is trying to fill her hydrocodone prescription there. Thao wanted to know if patient is moving there or on vacation as it's a bit unusual to fill a prescription in a different state. She said the laws are very strict now and that they can fill one month supply but if she is moving to [place],she would need to see someone there to continue receiving refills. Max Fickle is requesting a call to patient to see what is going on as to why she is wanting to fill rx there. She would like a call back once someone has talked to patient to let her know what is going on before she fills it. Her callback [phone].

## 2022-11-23 DIAGNOSIS — E1152 Type 2 diabetes mellitus with diabetic peripheral angiopathy with gangrene: Secondary | ICD-10-CM | POA: Diagnosis not present

## 2022-11-23 DIAGNOSIS — I10 Essential (primary) hypertension: Secondary | ICD-10-CM | POA: Diagnosis not present

## 2022-11-23 DIAGNOSIS — E785 Hyperlipidemia, unspecified: Secondary | ICD-10-CM | POA: Diagnosis not present

## 2022-11-23 DIAGNOSIS — I70245 Atherosclerosis of native arteries of left leg with ulceration of other part of foot: Secondary | ICD-10-CM | POA: Diagnosis not present

## 2022-11-23 LAB — DRUG MONITORING PANEL 376104, URINE
Amphetamines: NEGATIVE ng/mL (ref <500)
Barbiturates: NEGATIVE ng/mL (ref <300)
Benzodiazepines: NEGATIVE ng/mL (ref <100)
Cocaine Metabolite: NEGATIVE ng/mL (ref <150)
Codeine: NEGATIVE ng/mL (ref <50)
Desmethyltramadol: NEGATIVE ng/mL (ref <100)
Hydrocodone: 1736 ng/mL — ABNORMAL HIGH (ref <50)
Hydromorphone: 149 ng/mL — ABNORMAL HIGH (ref <50)
Morphine: NEGATIVE ng/mL (ref <50)
Norhydrocodone: 2123 ng/mL — ABNORMAL HIGH (ref <50)
Opiates: POSITIVE ng/mL — AB (ref <100)
Oxycodone: NEGATIVE ng/mL (ref <100)
Tramadol: NEGATIVE ng/mL (ref <100)

## 2022-11-23 LAB — DM TEMPLATE

## 2022-11-24 DIAGNOSIS — I70213 Atherosclerosis of native arteries of extremities with intermittent claudication, bilateral legs: Secondary | ICD-10-CM | POA: Diagnosis not present

## 2022-11-27 DIAGNOSIS — L89622 Pressure ulcer of left heel, stage 2: Secondary | ICD-10-CM | POA: Diagnosis not present

## 2022-11-27 DIAGNOSIS — I70292 Other atherosclerosis of native arteries of extremities, left leg: Secondary | ICD-10-CM | POA: Diagnosis not present

## 2022-11-27 DIAGNOSIS — I10 Essential (primary) hypertension: Secondary | ICD-10-CM | POA: Diagnosis not present

## 2022-11-27 DIAGNOSIS — Z89511 Acquired absence of right leg below knee: Secondary | ICD-10-CM | POA: Diagnosis not present

## 2022-11-27 DIAGNOSIS — E11621 Type 2 diabetes mellitus with foot ulcer: Secondary | ICD-10-CM | POA: Diagnosis not present

## 2022-11-27 DIAGNOSIS — M2042 Other hammer toe(s) (acquired), left foot: Secondary | ICD-10-CM | POA: Diagnosis not present

## 2022-11-27 DIAGNOSIS — E1151 Type 2 diabetes mellitus with diabetic peripheral angiopathy without gangrene: Secondary | ICD-10-CM | POA: Diagnosis not present

## 2022-11-28 ENCOUNTER — Other Ambulatory Visit: Payer: Self-pay | Admitting: Internal Medicine

## 2022-11-30 DIAGNOSIS — I70244 Atherosclerosis of native arteries of left leg with ulceration of heel and midfoot: Secondary | ICD-10-CM | POA: Diagnosis not present

## 2022-11-30 DIAGNOSIS — E1152 Type 2 diabetes mellitus with diabetic peripheral angiopathy with gangrene: Secondary | ICD-10-CM | POA: Diagnosis not present

## 2022-12-01 DIAGNOSIS — I70213 Atherosclerosis of native arteries of extremities with intermittent claudication, bilateral legs: Secondary | ICD-10-CM | POA: Diagnosis not present

## 2022-12-07 DIAGNOSIS — I70245 Atherosclerosis of native arteries of left leg with ulceration of other part of foot: Secondary | ICD-10-CM | POA: Diagnosis not present

## 2022-12-07 DIAGNOSIS — I10 Essential (primary) hypertension: Secondary | ICD-10-CM | POA: Diagnosis not present

## 2022-12-07 DIAGNOSIS — E785 Hyperlipidemia, unspecified: Secondary | ICD-10-CM | POA: Diagnosis not present

## 2022-12-07 DIAGNOSIS — E1152 Type 2 diabetes mellitus with diabetic peripheral angiopathy with gangrene: Secondary | ICD-10-CM | POA: Diagnosis not present

## 2022-12-14 DIAGNOSIS — E785 Hyperlipidemia, unspecified: Secondary | ICD-10-CM | POA: Diagnosis not present

## 2022-12-14 DIAGNOSIS — L929 Granulomatous disorder of the skin and subcutaneous tissue, unspecified: Secondary | ICD-10-CM | POA: Diagnosis not present

## 2022-12-14 DIAGNOSIS — I70213 Atherosclerosis of native arteries of extremities with intermittent claudication, bilateral legs: Secondary | ICD-10-CM | POA: Diagnosis not present

## 2022-12-14 DIAGNOSIS — I70244 Atherosclerosis of native arteries of left leg with ulceration of heel and midfoot: Secondary | ICD-10-CM | POA: Diagnosis not present

## 2022-12-14 DIAGNOSIS — I7025 Atherosclerosis of native arteries of other extremities with ulceration: Secondary | ICD-10-CM | POA: Diagnosis not present

## 2022-12-14 DIAGNOSIS — L8990 Pressure ulcer of unspecified site, unspecified stage: Secondary | ICD-10-CM | POA: Diagnosis not present

## 2022-12-14 DIAGNOSIS — E1152 Type 2 diabetes mellitus with diabetic peripheral angiopathy with gangrene: Secondary | ICD-10-CM | POA: Diagnosis not present

## 2022-12-14 DIAGNOSIS — I10 Essential (primary) hypertension: Secondary | ICD-10-CM | POA: Diagnosis not present

## 2022-12-14 DIAGNOSIS — I70245 Atherosclerosis of native arteries of left leg with ulceration of other part of foot: Secondary | ICD-10-CM | POA: Diagnosis not present

## 2022-12-15 DIAGNOSIS — I70213 Atherosclerosis of native arteries of extremities with intermittent claudication, bilateral legs: Secondary | ICD-10-CM | POA: Diagnosis not present

## 2022-12-15 DIAGNOSIS — I70245 Atherosclerosis of native arteries of left leg with ulceration of other part of foot: Secondary | ICD-10-CM | POA: Diagnosis not present

## 2022-12-15 DIAGNOSIS — I10 Essential (primary) hypertension: Secondary | ICD-10-CM | POA: Diagnosis not present

## 2022-12-15 DIAGNOSIS — I70244 Atherosclerosis of native arteries of left leg with ulceration of heel and midfoot: Secondary | ICD-10-CM | POA: Diagnosis not present

## 2022-12-21 DIAGNOSIS — E1152 Type 2 diabetes mellitus with diabetic peripheral angiopathy with gangrene: Secondary | ICD-10-CM | POA: Diagnosis not present

## 2022-12-21 DIAGNOSIS — I70244 Atherosclerosis of native arteries of left leg with ulceration of heel and midfoot: Secondary | ICD-10-CM | POA: Diagnosis not present

## 2022-12-21 DIAGNOSIS — I70213 Atherosclerosis of native arteries of extremities with intermittent claudication, bilateral legs: Secondary | ICD-10-CM | POA: Diagnosis not present

## 2022-12-26 ENCOUNTER — Inpatient Hospital Stay (HOSPITAL_BASED_OUTPATIENT_CLINIC_OR_DEPARTMENT_OTHER): Payer: Medicare Other | Admitting: Family

## 2022-12-26 ENCOUNTER — Inpatient Hospital Stay: Payer: Medicare Other | Attending: Hematology & Oncology

## 2022-12-26 VITALS — BP 76/57 | HR 76 | Temp 98.0°F | Resp 17 | Ht 64.0 in

## 2022-12-26 DIAGNOSIS — D509 Iron deficiency anemia, unspecified: Secondary | ICD-10-CM | POA: Insufficient documentation

## 2022-12-26 DIAGNOSIS — D696 Thrombocytopenia, unspecified: Secondary | ICD-10-CM | POA: Diagnosis not present

## 2022-12-26 LAB — CBC WITH DIFFERENTIAL (CANCER CENTER ONLY)
Abs Immature Granulocytes: 0.01 10*3/uL (ref 0.00–0.07)
Basophils Absolute: 0 10*3/uL (ref 0.0–0.1)
Basophils Relative: 0 %
Eosinophils Absolute: 0.1 10*3/uL (ref 0.0–0.5)
Eosinophils Relative: 3 %
HCT: 34.8 % — ABNORMAL LOW (ref 36.0–46.0)
Hemoglobin: 10.5 g/dL — ABNORMAL LOW (ref 12.0–15.0)
Immature Granulocytes: 0 %
Lymphocytes Relative: 37 %
Lymphs Abs: 1.5 10*3/uL (ref 0.7–4.0)
MCH: 29.7 pg (ref 26.0–34.0)
MCHC: 30.2 g/dL (ref 30.0–36.0)
MCV: 98.3 fL (ref 80.0–100.0)
Monocytes Absolute: 0.3 10*3/uL (ref 0.1–1.0)
Monocytes Relative: 6 %
Neutro Abs: 2.2 10*3/uL (ref 1.7–7.7)
Neutrophils Relative %: 54 %
Platelet Count: 135 10*3/uL — ABNORMAL LOW (ref 150–400)
RBC: 3.54 MIL/uL — ABNORMAL LOW (ref 3.87–5.11)
RDW: 14.5 % (ref 11.5–15.5)
WBC Count: 4.2 10*3/uL (ref 4.0–10.5)
nRBC: 0 % (ref 0.0–0.2)

## 2022-12-26 LAB — RETICULOCYTES
Immature Retic Fract: 12.4 % (ref 2.3–15.9)
RBC.: 3.48 MIL/uL — ABNORMAL LOW (ref 3.87–5.11)
Retic Count, Absolute: 39.3 10*3/uL (ref 19.0–186.0)
Retic Ct Pct: 1.1 % (ref 0.4–3.1)

## 2022-12-26 LAB — FERRITIN: Ferritin: 18 ng/mL (ref 11–307)

## 2022-12-26 NOTE — Progress Notes (Signed)
Hematology and Oncology Follow Up Visit  [name] [id number] 04-May-1935 88 y.o. 12/26/2022   Principle Diagnosis:  Iron deficiency anemia  Mild thrombocytopenia    Current Therapy:        IV iron as indicated    Interim History:  Ms. Sangiacomo is here today with her son for follow-up. She is doing well and states that her energy seems to be improved.  She had surgery with vascular to improve the blood flow in her lower extremities. This seems to be helping the wound on her left foot heal. She has a wrap and boot on today and is in a wheelchair for longer distances. She states that her goal is to be able to take walks.    No fever, chills, n/v, cough, rash, dizziness, SOB, chest pain, palpitations, abdominal pain or changes in bladder habits at this time.  She fluctuates at times between constipation and diarrhea. This comes and goes.  No issue with blood loss. No abnormal bruising. No petechiae.  No falls or syncope reported.  Appetite decreased which she attribute to changes in taste. She is doing her best to stay well hydrated. She did not stand for a weight today.   ECOG Performance Status: 2 - Symptomatic, <50% confined to bed  Medications:  Allergies as of 12/26/2022       Reactions   Metformin And Related Diarrhea, Nausea Only, Other (See Comments)   dizzy, tired, chills   Cymbalta [duloxetine Hcl] Swelling   Swelling in legs   Gabapentin Swelling   Swelling in legs   Silicone Hives, Itching, Dermatitis, Rash   Tramadol Swelling   Adhesive [tape] Rash   Cefazolin Hives      Ciprofloxacin Other (See Comments)   body aches   Clorazepate Dipotassium Other (See Comments)   Unknown reaction   Dilaudid [hydromorphone Hcl] Itching    confused,   Doxycycline Rash   Enablex [darifenacin Hydrobromide Er] Other (See Comments)   Hypotension, near syncope   Levofloxacin Other (See Comments)   Causes wrist pain   Lyrica [pregabalin] Swelling   Swelling in legs   Methadone  Hcl Other (See Comments)   Reaction unknown   Morphine And Related Other (See Comments)   Confusion, constipation.   Talwin [pentazocine] Other (See Comments)   "climbing walls" anxiety        Medication List        Accurate as of December 26, 2022  2:29 PM. If you have any questions, ask your nurse or doctor.          Alpha-Lipoic Acid 600 MG Caps Take 1,200 mg by mouth daily.   amitriptyline 25 MG tablet Commonly known as: ELAVIL Take 1 tablet (25 mg total) by mouth at bedtime.   atorvastatin 40 MG tablet Commonly known as: LIPITOR Take 40 mg by mouth daily.   carvedilol 12.5 MG tablet Commonly known as: COREG Take 12.5 mg by mouth 2 (two) times daily with a meal.   CINNAMON PO Take 2,000 mg by mouth daily.   clopidogrel 75 MG tablet Commonly known as: PLAVIX Take 1 tablet (75 mg total) by mouth at bedtime. Restart on 12/04/2020   Cranberry 200 MG Caps   diclofenac Sodium 1 % Gel Commonly known as: VOLTAREN Apply 4 g topically 4 (four) times daily. What changed: additional instructions   dicyclomine 10 MG capsule Commonly known as: BENTYL TAKE 1 CAPSULE THREE TIMES A DAY BEFORE MEALS (NEED APPOINTMENT)   donepezil 10 MG tablet  Commonly known as: ARICEPT Take 1 tablet (10 mg total) by mouth at bedtime.   famotidine 20 MG tablet Commonly known as: PEPCID Take 1 tablet (20 mg total) by mouth 2 (two) times daily.   freestyle lancets CHECK BLOOD SUGAR TWICE DAILY   FREESTYLE LITE test strip Generic drug: glucose blood USE TO CHECK BLOOD SUGAR NO MORE THAN TWICE DAILY   furosemide 20 MG tablet Commonly known as: LASIX Take 20 mg by mouth daily.   HYDROcodone-acetaminophen 10-325 MG tablet Commonly known as: NORCO Take 1 tablet by mouth every 6 (six) hours as needed.   hydrocortisone 2.5 % rectal cream Commonly known as: ANUSOL-HC Place 1 Application rectally 2 (two) times daily. As needed   hydrocortisone 25 MG suppository Commonly known as:  ANUSOL-HC Place 1 suppository (25 mg total) rectally 2 (two) times daily. As needed   ketoconazole 2 % cream Commonly known as: NIZORAL Apply 1 application. topically daily.   linaclotide 72 MCG capsule Commonly known as: Linzess Take 1 capsule (72 mcg total) by mouth daily before breakfast.   loperamide 2 MG tablet Commonly known as: IMODIUM A-D Take 2 mg by mouth as needed for diarrhea or loose stools.   MegaRed Omega-3 Krill Oil 500 MG Caps Restart on 12/04/2020   methenamine 1 g tablet Commonly known as: HIPREX Take 0.5 tablets by mouth daily.   naloxone 4 MG/0.1ML Liqd nasal spray kit Commonly known as: NARCAN Place 1 spray into the nose once as needed for up to 1 dose.   nitroGLYCERIN 0.4 MG SL tablet Commonly known as: NITROSTAT Place 1 tablet (0.4 mg total) under the tongue every 5 (five) minutes x 3 doses as needed for chest pain.   omeprazole 20 MG capsule Commonly known as: PRILOSEC TAKE 1 CAPSULE TWICE A DAY BEFORE MEALS   ondansetron 4 MG tablet Commonly known as: ZOFRAN TAKE 1 TABLET(4 MG) BY MOUTH EVERY 8 HOURS AS NEEDED FOR NAUSEA OR VOMITING   OVER THE COUNTER MEDICATION 1 tablet 3 (three) times daily. Berberorine Gluco Defense   pramipexole 0.125 MG tablet Commonly known as: MIRAPEX Take 1-2 tablets (0.125-0.25 mg total) by mouth at bedtime as needed.   PreserVision AREDS 2 Caps Take 1 capsule by mouth 2 (two) times daily.   PROBIOTIC PEARLS PO Take 1 tablet by mouth daily.   REFRESH OP Place 1 drop into both eyes 3 (three) times daily as needed (dry eyes).   rivaroxaban 2.5 MG Tabs tablet Commonly known as: XARELTO Take 1 tablet (2.5 mg total) by mouth 2 (two) times daily. Restart on 12/04/2020   silver sulfADIAZINE 1 % cream Commonly known as: Silvadene Apply 1 application topically daily.   Tussin DM 10-100 MG/5ML liquid Generic drug: dextromethorphan-guaiFENesin Take by mouth every 4 (four) hours as needed for cough.   Vitamin B-12  5000 MCG Subl Place 5,000 mcg under the tongue daily.   vitamin C with rose hips 500 MG tablet Take 500 mg by mouth in the morning and at bedtime.   Vitamin D3 125 MCG (5000 UT) capsule Generic drug: Cholecalciferol Take 5,000 Units by mouth every evening. Reported on 11/24/2015   vitamin E 180 MG (400 UNITS) capsule Take 400 Units by mouth daily.   Zinc Sulfate 66 MG Tabs        Allergies:  Allergies  Allergen Reactions   Metformin And Related Diarrhea, Nausea Only and Other (See Comments)    dizzy, tired, chills   Cymbalta [Duloxetine Hcl] Swelling  Swelling in legs   Gabapentin Swelling    Swelling in legs   Silicone Hives, Itching, Dermatitis and Rash   Tramadol Swelling   Adhesive [Tape] Rash   Cefazolin Hives        Ciprofloxacin Other (See Comments)    body aches    Clorazepate Dipotassium Other (See Comments)    Unknown reaction   Dilaudid [Hydromorphone Hcl] Itching     confused,   Doxycycline Rash   Enablex [Darifenacin Hydrobromide Er] Other (See Comments)    Hypotension, near syncope   Levofloxacin Other (See Comments)    Causes wrist pain   Lyrica [Pregabalin] Swelling    Swelling in legs   Methadone Hcl Other (See Comments)    Reaction unknown   Morphine And Related Other (See Comments)    Confusion, constipation.    Talwin [Pentazocine] Other (See Comments)    "climbing walls" anxiety    Past Medical History, Surgical history, Social history, and Family History were reviewed and updated.  Review of Systems: All other 10 point review of systems is negative.   Physical Exam:  height is '5\' 4"'$  (1.626 m). Her oral temperature is 98 F (36.7 C). Her blood pressure is 76/57 (abnormal) and her pulse is 76. Her respiration is 17 and oxygen saturation is 100%.   Wt Readings from Last 3 Encounters:  11/21/22 174 lb 4 oz (79 kg)  11/20/22 270 lb (122.5 kg)  10/04/22 270 lb (122.5 kg)    Ocular: Sclerae unicteric, pupils equal, round and  reactive to light Ear-nose-throat: Oropharynx clear, dentition fair Lymphatic: No cervical or supraclavicular adenopathy Lungs no rales or rhonchi, good excursion bilaterally Heart regular rate and rhythm, no murmur appreciated Abd soft, nontender, positive bowel sounds MSK no focal spinal tenderness, no joint edema Neuro: non-focal, well-oriented, appropriate affect Breasts: Deferred   Lab Results  Component Value Date   WBC 4.2 12/26/2022   HGB 10.5 (L) 12/26/2022   HCT 34.8 (L) 12/26/2022   MCV 98.3 12/26/2022   PLT 135 (L) 12/26/2022   Lab Results  Component Value Date   FERRITIN 28 08/21/2022   IRON 70 08/21/2022   TIBC 297 08/21/2022   UIBC 227 08/21/2022   IRONPCTSAT 24 08/21/2022   Lab Results  Component Value Date   RETICCTPCT 1.1 12/26/2022   RBC 3.54 (L) 12/26/2022   RBC 3.48 (L) 12/26/2022   No results found for: "KPAFRELGTCHN", "LAMBDASER", "KAPLAMBRATIO" No results found for: "IGGSERUM", "IGA", "IGMSERUM" No results found for: "TOTALPROTELP", "ALBUMINELP", "A1GS", "A2GS", "BETS", "BETA2SER", "GAMS", "MSPIKE", "SPEI"   Chemistry      Component Value Date/Time   NA 140 11/21/2022 1420   NA 140 09/03/2020 0000   K 3.8 11/21/2022 1420   CL 110 11/21/2022 1420   CO2 22 11/21/2022 1420   BUN 16 11/21/2022 1420   BUN 17 09/03/2020 0000   CREATININE 0.91 11/21/2022 1420   CREATININE 1.17 (H) 04/11/2022 1443   CREATININE 0.89 (H) 08/27/2020 1634   GLU 146 09/03/2020 0000      Component Value Date/Time   CALCIUM 8.1 (L) 11/21/2022 1420   ALKPHOS 39 04/11/2022 1443   AST 10 (L) 04/11/2022 1443   ALT 7 04/11/2022 1443   BILITOT 0.3 04/11/2022 1443       Impression and Plan: Ms. Humenik is a very pleasant 88 yo caucasian female with iron deficiency anemia secondary to GI blood loss and mild thrombocytopenia.  Iron studies are pending.  Follow-up in 4 months.   Judson Roch  Eulas Post, NP 3/5/20242:29 PM

## 2022-12-27 LAB — IRON AND IRON BINDING CAPACITY (CC-WL,HP ONLY)
Iron: 74 ug/dL (ref 28–170)
Saturation Ratios: 25 % (ref 10.4–31.8)
TIBC: 300 ug/dL (ref 250–450)
UIBC: 226 ug/dL (ref 148–442)

## 2023-01-01 DIAGNOSIS — I10 Essential (primary) hypertension: Secondary | ICD-10-CM | POA: Diagnosis not present

## 2023-01-01 DIAGNOSIS — Z89511 Acquired absence of right leg below knee: Secondary | ICD-10-CM | POA: Diagnosis not present

## 2023-01-01 DIAGNOSIS — L89622 Pressure ulcer of left heel, stage 2: Secondary | ICD-10-CM | POA: Diagnosis not present

## 2023-01-01 DIAGNOSIS — M2042 Other hammer toe(s) (acquired), left foot: Secondary | ICD-10-CM | POA: Diagnosis not present

## 2023-01-01 DIAGNOSIS — I70292 Other atherosclerosis of native arteries of extremities, left leg: Secondary | ICD-10-CM | POA: Diagnosis not present

## 2023-01-01 DIAGNOSIS — E1152 Type 2 diabetes mellitus with diabetic peripheral angiopathy with gangrene: Secondary | ICD-10-CM | POA: Diagnosis not present

## 2023-01-01 DIAGNOSIS — E1151 Type 2 diabetes mellitus with diabetic peripheral angiopathy without gangrene: Secondary | ICD-10-CM | POA: Diagnosis not present

## 2023-01-01 DIAGNOSIS — I70213 Atherosclerosis of native arteries of extremities with intermittent claudication, bilateral legs: Secondary | ICD-10-CM | POA: Diagnosis not present

## 2023-01-01 DIAGNOSIS — E11621 Type 2 diabetes mellitus with foot ulcer: Secondary | ICD-10-CM | POA: Diagnosis not present

## 2023-01-01 DIAGNOSIS — I70244 Atherosclerosis of native arteries of left leg with ulceration of heel and midfoot: Secondary | ICD-10-CM | POA: Diagnosis not present

## 2023-01-03 DIAGNOSIS — I10 Essential (primary) hypertension: Secondary | ICD-10-CM | POA: Diagnosis not present

## 2023-01-03 DIAGNOSIS — I7025 Atherosclerosis of native arteries of other extremities with ulceration: Secondary | ICD-10-CM | POA: Diagnosis not present

## 2023-01-03 DIAGNOSIS — I70244 Atherosclerosis of native arteries of left leg with ulceration of heel and midfoot: Secondary | ICD-10-CM | POA: Diagnosis not present

## 2023-01-03 DIAGNOSIS — E669 Obesity, unspecified: Secondary | ICD-10-CM | POA: Diagnosis not present

## 2023-01-03 DIAGNOSIS — I879 Disorder of vein, unspecified: Secondary | ICD-10-CM | POA: Diagnosis not present

## 2023-01-03 DIAGNOSIS — I871 Compression of vein: Secondary | ICD-10-CM | POA: Diagnosis not present

## 2023-01-03 DIAGNOSIS — E119 Type 2 diabetes mellitus without complications: Secondary | ICD-10-CM | POA: Diagnosis not present

## 2023-01-03 DIAGNOSIS — I70208 Unspecified atherosclerosis of native arteries of extremities, other extremity: Secondary | ICD-10-CM | POA: Diagnosis not present

## 2023-01-03 DIAGNOSIS — E782 Mixed hyperlipidemia: Secondary | ICD-10-CM | POA: Diagnosis not present

## 2023-01-03 DIAGNOSIS — I5022 Chronic systolic (congestive) heart failure: Secondary | ICD-10-CM | POA: Diagnosis not present

## 2023-01-03 DIAGNOSIS — I25728 Atherosclerosis of autologous artery coronary artery bypass graft(s) with other forms of angina pectoris: Secondary | ICD-10-CM | POA: Diagnosis not present

## 2023-01-03 DIAGNOSIS — I70234 Atherosclerosis of native arteries of right leg with ulceration of heel and midfoot: Secondary | ICD-10-CM | POA: Diagnosis not present

## 2023-01-05 ENCOUNTER — Telehealth: Payer: Self-pay | Admitting: Internal Medicine

## 2023-01-05 DIAGNOSIS — N390 Urinary tract infection, site not specified: Secondary | ICD-10-CM | POA: Diagnosis not present

## 2023-01-05 DIAGNOSIS — R531 Weakness: Secondary | ICD-10-CM | POA: Diagnosis not present

## 2023-01-05 DIAGNOSIS — Z951 Presence of aortocoronary bypass graft: Secondary | ICD-10-CM | POA: Diagnosis not present

## 2023-01-05 DIAGNOSIS — K5730 Diverticulosis of large intestine without perforation or abscess without bleeding: Secondary | ICD-10-CM | POA: Diagnosis not present

## 2023-01-05 DIAGNOSIS — R1011 Right upper quadrant pain: Secondary | ICD-10-CM | POA: Diagnosis not present

## 2023-01-05 DIAGNOSIS — R197 Diarrhea, unspecified: Secondary | ICD-10-CM | POA: Diagnosis not present

## 2023-01-05 DIAGNOSIS — Z79899 Other long term (current) drug therapy: Secondary | ICD-10-CM | POA: Diagnosis not present

## 2023-01-05 DIAGNOSIS — R109 Unspecified abdominal pain: Secondary | ICD-10-CM | POA: Diagnosis not present

## 2023-01-05 DIAGNOSIS — K7689 Other specified diseases of liver: Secondary | ICD-10-CM | POA: Diagnosis not present

## 2023-01-05 NOTE — Telephone Encounter (Signed)
Pt's son states she has been complaining of abdominal pain for the past couple days and he is getting concerned. Transferred to triage.

## 2023-01-05 NOTE — Telephone Encounter (Signed)
Spoke w/ Dellis Filbert- he is taking her to [facility] ED in [place], [place]. Pt insisting on putting her make-up on but he is taking her as soon as she is done. We lost connection and I had to call back, I left Dellis Filbert a [employer] w/ our fax number for him to have the ED fax [place] records.

## 2023-01-05 NOTE — Telephone Encounter (Signed)
Noted, thank you

## 2023-01-05 NOTE — Telephone Encounter (Signed)
Initial Comment Caller states their mother has been having abdominal pain (severe). They cannot move. [place] Not Listed 24 hr UC/ED on [address] Translation No Nurse Assessment Nurse: Zorita Pang, RN, Neoma Laming Date/Time (Eastern Time): 01/05/2023 1:50:47 PM Confirm and document reason for call. If symptomatic, describe symptoms. ---Caller states that his mother is in abdominal pain and radiates to her back. That started earlier in the week and she is still in pain. Now is pain is more in the abdomen. Does the patient have any new or worsening symptoms? ---Yes Will a triage be completed? ---Yes Related visit to physician within the last 2 weeks? ---No Does the PT have any chronic conditions? (i.e. diabetes, asthma, this includes High risk factors for pregnancy, etc.) ---Yes List chronic conditions. ---diabetes, heart bypass 2 years ago, neuropathy and disc Is this a behavioral health or substance abuse call? ---No Guidelines Guideline Title Affirmed Question Affirmed Notes Nurse Date/Time (Eastern Time) Abdominal Pain - Female [1] SEVERE pain (e.g., excruciating) AND [2] present > 1 hour Womble, RN, Neoma Laming 01/05/2023 1:53:16 PM PLEASE NOTE: All timestamps contained within this report are represented as [place] Standard Time. CONFIDENTIALTY NOTICE: This fax transmission is intended only for the addressee. It contains information that is legally privileged, confidential or otherwise protected from use or disclosure. If you are not the intended recipient, you are strictly prohibited from reviewing, disclosing, copying using or disseminating any of this information or taking any action in reliance on or regarding this information. If you have received this fax in error, please notify us immediately by telephone so that we can arrange for its return to [place]. Phone: [phone], Toll-Free: [phone], Fax: [phone] Page: 2 of 2 Call Id: [id number] [place]. Time  Eilene Ghazi Time) Disposition Final User 01/05/2023 1:49:06 PM Send to Urgent Harrie Jeans 01/05/2023 1:59:23 PM Go to ED Now Yes Zorita Pang, RN, Deborah Final Disposition 01/05/2023 1:59:23 PM Go to ED Now Yes Zorita Pang, RN, Garrel Ridgel Disagree/Comply Comply Caller Understands Yes PreDisposition Call Doctor Care Advice Given Per Guideline GO TO ED NOW: * You need to be seen in the Emergency Department. Referrals GO TO FACILITY OTHER - SPECIFY

## 2023-01-07 ENCOUNTER — Other Ambulatory Visit: Payer: Self-pay | Admitting: Internal Medicine

## 2023-01-08 DIAGNOSIS — M79605 Pain in left leg: Secondary | ICD-10-CM | POA: Diagnosis not present

## 2023-01-08 DIAGNOSIS — I70244 Atherosclerosis of native arteries of left leg with ulceration of heel and midfoot: Secondary | ICD-10-CM | POA: Diagnosis not present

## 2023-01-08 DIAGNOSIS — E1152 Type 2 diabetes mellitus with diabetic peripheral angiopathy with gangrene: Secondary | ICD-10-CM | POA: Diagnosis not present

## 2023-01-12 DIAGNOSIS — S46819A Strain of other muscles, fascia and tendons at shoulder and upper arm level, unspecified arm, initial encounter: Secondary | ICD-10-CM | POA: Diagnosis not present

## 2023-01-12 DIAGNOSIS — Y999 Unspecified external cause status: Secondary | ICD-10-CM | POA: Diagnosis not present

## 2023-01-12 DIAGNOSIS — E119 Type 2 diabetes mellitus without complications: Secondary | ICD-10-CM | POA: Diagnosis not present

## 2023-01-12 DIAGNOSIS — Z79899 Other long term (current) drug therapy: Secondary | ICD-10-CM | POA: Diagnosis not present

## 2023-01-12 DIAGNOSIS — N289 Disorder of kidney and ureter, unspecified: Secondary | ICD-10-CM | POA: Diagnosis not present

## 2023-01-12 DIAGNOSIS — I25728 Atherosclerosis of autologous artery coronary artery bypass graft(s) with other forms of angina pectoris: Secondary | ICD-10-CM | POA: Diagnosis not present

## 2023-01-12 DIAGNOSIS — R079 Chest pain, unspecified: Secondary | ICD-10-CM | POA: Diagnosis not present

## 2023-01-12 DIAGNOSIS — M40204 Unspecified kyphosis, thoracic region: Secondary | ICD-10-CM | POA: Diagnosis not present

## 2023-01-12 DIAGNOSIS — M549 Dorsalgia, unspecified: Secondary | ICD-10-CM | POA: Diagnosis not present

## 2023-01-12 DIAGNOSIS — I249 Acute ischemic heart disease, unspecified: Secondary | ICD-10-CM | POA: Diagnosis not present

## 2023-01-12 DIAGNOSIS — I10 Essential (primary) hypertension: Secondary | ICD-10-CM | POA: Diagnosis not present

## 2023-01-12 DIAGNOSIS — E782 Mixed hyperlipidemia: Secondary | ICD-10-CM | POA: Diagnosis not present

## 2023-01-12 DIAGNOSIS — F0390 Unspecified dementia without behavioral disturbance: Secondary | ICD-10-CM | POA: Diagnosis not present

## 2023-01-12 DIAGNOSIS — I70213 Atherosclerosis of native arteries of extremities with intermittent claudication, bilateral legs: Secondary | ICD-10-CM | POA: Diagnosis not present

## 2023-01-15 DIAGNOSIS — I70244 Atherosclerosis of native arteries of left leg with ulceration of heel and midfoot: Secondary | ICD-10-CM | POA: Diagnosis not present

## 2023-01-16 DIAGNOSIS — I10 Essential (primary) hypertension: Secondary | ICD-10-CM | POA: Diagnosis not present

## 2023-01-16 DIAGNOSIS — I70245 Atherosclerosis of native arteries of left leg with ulceration of other part of foot: Secondary | ICD-10-CM | POA: Diagnosis not present

## 2023-01-16 DIAGNOSIS — I70213 Atherosclerosis of native arteries of extremities with intermittent claudication, bilateral legs: Secondary | ICD-10-CM | POA: Diagnosis not present

## 2023-01-18 ENCOUNTER — Other Ambulatory Visit: Payer: Self-pay | Admitting: Gastroenterology

## 2023-01-18 ENCOUNTER — Telehealth: Payer: Self-pay | Admitting: Internal Medicine

## 2023-01-18 MED ORDER — HYDROCODONE-ACETAMINOPHEN 10-325 MG PO TABS: 1.0000 | ORAL_TABLET | Freq: Four times a day (QID) | ORAL | 0 refills | Status: DC | PRN

## 2023-01-18 NOTE — Telephone Encounter (Signed)
Prescription Request  01/18/2023  Is this a "Controlled Substance" medicine? Yes  LOV: 11/21/2022  What is the name of the medication or equipment?  HYDROcodone-acetaminophen (NORCO) 10-325 MG tablet [id number]   Have you contacted your pharmacy to request a refill? No   Which pharmacy would you like this sent to?  [id number] - [place], [place] [facility] [address] [place] [address] Phone: [phone] Fax: [phone]   Patient notified that their request is being sent to the clinical staff for review and that they should receive a response within 2 business days.   Please advise at Mobile [phone] (mobile)

## 2023-01-18 NOTE — Addendum Note (Signed)
Addended by: [name] E on: 01/18/2023 12:00 PM   Modules accepted: Orders

## 2023-01-18 NOTE — Addendum Note (Signed)
Addended byDamita Dunnings D on: 01/18/2023 10:01 AM   Modules accepted: Orders

## 2023-01-18 NOTE — Telephone Encounter (Signed)
PDMP okay, RX sent 

## 2023-01-18 NOTE — Telephone Encounter (Signed)
Requesting: hydrocodone 10-325mg   Contract: 11/28/22 UDS: 11/21/22 Last Visit: 11/21/22 Next Visit: None Last Refill: 11/16/22 #120 and 0RF   Please Advise

## 2023-01-21 DIAGNOSIS — R0602 Shortness of breath: Secondary | ICD-10-CM | POA: Diagnosis not present

## 2023-01-21 DIAGNOSIS — Z743 Need for continuous supervision: Secondary | ICD-10-CM | POA: Diagnosis not present

## 2023-01-22 DIAGNOSIS — E119 Type 2 diabetes mellitus without complications: Secondary | ICD-10-CM | POA: Diagnosis not present

## 2023-01-22 DIAGNOSIS — F0390 Unspecified dementia without behavioral disturbance: Secondary | ICD-10-CM | POA: Diagnosis not present

## 2023-01-22 DIAGNOSIS — N179 Acute kidney failure, unspecified: Secondary | ICD-10-CM | POA: Diagnosis not present

## 2023-01-22 DIAGNOSIS — Z89511 Acquired absence of right leg below knee: Secondary | ICD-10-CM | POA: Diagnosis not present

## 2023-01-22 DIAGNOSIS — Z7982 Long term (current) use of aspirin: Secondary | ICD-10-CM | POA: Diagnosis not present

## 2023-01-22 DIAGNOSIS — Z7984 Long term (current) use of oral hypoglycemic drugs: Secondary | ICD-10-CM | POA: Diagnosis not present

## 2023-01-22 DIAGNOSIS — I82401 Acute embolism and thrombosis of unspecified deep veins of right lower extremity: Secondary | ICD-10-CM | POA: Diagnosis not present

## 2023-01-22 DIAGNOSIS — I2510 Atherosclerotic heart disease of native coronary artery without angina pectoris: Secondary | ICD-10-CM | POA: Diagnosis not present

## 2023-01-22 DIAGNOSIS — Z86711 Personal history of pulmonary embolism: Secondary | ICD-10-CM | POA: Diagnosis not present

## 2023-01-22 DIAGNOSIS — I259 Chronic ischemic heart disease, unspecified: Secondary | ICD-10-CM | POA: Diagnosis not present

## 2023-01-22 DIAGNOSIS — R079 Chest pain, unspecified: Secondary | ICD-10-CM | POA: Diagnosis not present

## 2023-01-30 ENCOUNTER — Encounter: Payer: Self-pay | Admitting: Internal Medicine

## 2023-02-13 DIAGNOSIS — I10 Essential (primary) hypertension: Secondary | ICD-10-CM | POA: Diagnosis not present

## 2023-02-13 DIAGNOSIS — I70213 Atherosclerosis of native arteries of extremities with intermittent claudication, bilateral legs: Secondary | ICD-10-CM | POA: Diagnosis not present

## 2023-02-13 DIAGNOSIS — I70244 Atherosclerosis of native arteries of left leg with ulceration of heel and midfoot: Secondary | ICD-10-CM | POA: Diagnosis not present

## 2023-02-14 ENCOUNTER — Ambulatory Visit: Payer: Medicare Other | Admitting: Internal Medicine

## 2023-02-14 DIAGNOSIS — E782 Mixed hyperlipidemia: Secondary | ICD-10-CM | POA: Diagnosis not present

## 2023-02-14 DIAGNOSIS — E119 Type 2 diabetes mellitus without complications: Secondary | ICD-10-CM | POA: Diagnosis not present

## 2023-02-14 DIAGNOSIS — I25728 Atherosclerosis of autologous artery coronary artery bypass graft(s) with other forms of angina pectoris: Secondary | ICD-10-CM | POA: Diagnosis not present

## 2023-02-14 DIAGNOSIS — E1152 Type 2 diabetes mellitus with diabetic peripheral angiopathy with gangrene: Secondary | ICD-10-CM | POA: Diagnosis not present

## 2023-02-14 DIAGNOSIS — I70244 Atherosclerosis of native arteries of left leg with ulceration of heel and midfoot: Secondary | ICD-10-CM | POA: Diagnosis not present

## 2023-02-14 DIAGNOSIS — E785 Hyperlipidemia, unspecified: Secondary | ICD-10-CM | POA: Diagnosis not present

## 2023-02-14 DIAGNOSIS — I10 Essential (primary) hypertension: Secondary | ICD-10-CM | POA: Diagnosis not present

## 2023-02-14 DIAGNOSIS — I70213 Atherosclerosis of native arteries of extremities with intermittent claudication, bilateral legs: Secondary | ICD-10-CM | POA: Diagnosis not present

## 2023-02-14 DIAGNOSIS — L8990 Pressure ulcer of unspecified site, unspecified stage: Secondary | ICD-10-CM | POA: Diagnosis not present

## 2023-02-14 DIAGNOSIS — M79605 Pain in left leg: Secondary | ICD-10-CM | POA: Diagnosis not present

## 2023-02-15 DIAGNOSIS — I25728 Atherosclerosis of autologous artery coronary artery bypass graft(s) with other forms of angina pectoris: Secondary | ICD-10-CM | POA: Diagnosis not present

## 2023-02-15 DIAGNOSIS — E782 Mixed hyperlipidemia: Secondary | ICD-10-CM | POA: Diagnosis not present

## 2023-02-15 DIAGNOSIS — I10 Essential (primary) hypertension: Secondary | ICD-10-CM | POA: Diagnosis not present

## 2023-02-16 DIAGNOSIS — I70213 Atherosclerosis of native arteries of extremities with intermittent claudication, bilateral legs: Secondary | ICD-10-CM | POA: Diagnosis not present

## 2023-02-19 DIAGNOSIS — M2042 Other hammer toe(s) (acquired), left foot: Secondary | ICD-10-CM | POA: Diagnosis not present

## 2023-02-19 DIAGNOSIS — L89622 Pressure ulcer of left heel, stage 2: Secondary | ICD-10-CM | POA: Diagnosis not present

## 2023-02-19 DIAGNOSIS — I70292 Other atherosclerosis of native arteries of extremities, left leg: Secondary | ICD-10-CM | POA: Diagnosis not present

## 2023-02-19 DIAGNOSIS — Z89511 Acquired absence of right leg below knee: Secondary | ICD-10-CM | POA: Diagnosis not present

## 2023-02-19 DIAGNOSIS — E1151 Type 2 diabetes mellitus with diabetic peripheral angiopathy without gangrene: Secondary | ICD-10-CM | POA: Diagnosis not present

## 2023-02-19 DIAGNOSIS — E11621 Type 2 diabetes mellitus with foot ulcer: Secondary | ICD-10-CM | POA: Diagnosis not present

## 2023-02-19 DIAGNOSIS — I10 Essential (primary) hypertension: Secondary | ICD-10-CM | POA: Diagnosis not present

## 2023-02-22 DIAGNOSIS — E1152 Type 2 diabetes mellitus with diabetic peripheral angiopathy with gangrene: Secondary | ICD-10-CM | POA: Diagnosis not present

## 2023-02-22 DIAGNOSIS — M79672 Pain in left foot: Secondary | ICD-10-CM | POA: Diagnosis not present

## 2023-02-22 DIAGNOSIS — S91302A Unspecified open wound, left foot, initial encounter: Secondary | ICD-10-CM | POA: Diagnosis not present

## 2023-02-22 DIAGNOSIS — I70213 Atherosclerosis of native arteries of extremities with intermittent claudication, bilateral legs: Secondary | ICD-10-CM | POA: Diagnosis not present

## 2023-02-22 DIAGNOSIS — L8990 Pressure ulcer of unspecified site, unspecified stage: Secondary | ICD-10-CM | POA: Diagnosis not present

## 2023-02-22 DIAGNOSIS — E785 Hyperlipidemia, unspecified: Secondary | ICD-10-CM | POA: Diagnosis not present

## 2023-02-22 DIAGNOSIS — I10 Essential (primary) hypertension: Secondary | ICD-10-CM | POA: Diagnosis not present

## 2023-02-22 DIAGNOSIS — M79605 Pain in left leg: Secondary | ICD-10-CM | POA: Diagnosis not present

## 2023-02-22 DIAGNOSIS — I70245 Atherosclerosis of native arteries of left leg with ulceration of other part of foot: Secondary | ICD-10-CM | POA: Diagnosis not present

## 2023-02-23 DIAGNOSIS — I70213 Atherosclerosis of native arteries of extremities with intermittent claudication, bilateral legs: Secondary | ICD-10-CM | POA: Diagnosis not present

## 2023-02-23 DIAGNOSIS — E119 Type 2 diabetes mellitus without complications: Secondary | ICD-10-CM | POA: Diagnosis not present

## 2023-02-23 DIAGNOSIS — I10 Essential (primary) hypertension: Secondary | ICD-10-CM | POA: Diagnosis not present

## 2023-02-23 DIAGNOSIS — E782 Mixed hyperlipidemia: Secondary | ICD-10-CM | POA: Diagnosis not present

## 2023-02-23 DIAGNOSIS — I25728 Atherosclerosis of autologous artery coronary artery bypass graft(s) with other forms of angina pectoris: Secondary | ICD-10-CM | POA: Diagnosis not present

## 2023-03-01 DIAGNOSIS — E785 Hyperlipidemia, unspecified: Secondary | ICD-10-CM | POA: Diagnosis not present

## 2023-03-01 DIAGNOSIS — I10 Essential (primary) hypertension: Secondary | ICD-10-CM | POA: Diagnosis not present

## 2023-03-01 DIAGNOSIS — E1152 Type 2 diabetes mellitus with diabetic peripheral angiopathy with gangrene: Secondary | ICD-10-CM | POA: Diagnosis not present

## 2023-03-01 DIAGNOSIS — L8990 Pressure ulcer of unspecified site, unspecified stage: Secondary | ICD-10-CM | POA: Diagnosis not present

## 2023-03-01 DIAGNOSIS — I70244 Atherosclerosis of native arteries of left leg with ulceration of heel and midfoot: Secondary | ICD-10-CM | POA: Diagnosis not present

## 2023-03-12 DIAGNOSIS — E785 Hyperlipidemia, unspecified: Secondary | ICD-10-CM | POA: Diagnosis not present

## 2023-03-12 DIAGNOSIS — E1152 Type 2 diabetes mellitus with diabetic peripheral angiopathy with gangrene: Secondary | ICD-10-CM | POA: Diagnosis not present

## 2023-03-12 DIAGNOSIS — I10 Essential (primary) hypertension: Secondary | ICD-10-CM | POA: Diagnosis not present

## 2023-03-12 DIAGNOSIS — I70244 Atherosclerosis of native arteries of left leg with ulceration of heel and midfoot: Secondary | ICD-10-CM | POA: Diagnosis not present

## 2023-03-16 DIAGNOSIS — Z8262 Family history of osteoporosis: Secondary | ICD-10-CM | POA: Diagnosis not present

## 2023-03-16 DIAGNOSIS — Z1231 Encounter for screening mammogram for malignant neoplasm of breast: Secondary | ICD-10-CM | POA: Diagnosis not present

## 2023-03-16 DIAGNOSIS — M810 Age-related osteoporosis without current pathological fracture: Secondary | ICD-10-CM | POA: Diagnosis not present

## 2023-03-16 DIAGNOSIS — Z90710 Acquired absence of both cervix and uterus: Secondary | ICD-10-CM | POA: Diagnosis not present

## 2023-03-16 LAB — HM MAMMOGRAPHY

## 2023-03-16 LAB — HM DEXA SCAN

## 2023-03-20 ENCOUNTER — Encounter: Payer: Self-pay | Admitting: Internal Medicine

## 2023-03-21 ENCOUNTER — Ambulatory Visit (INDEPENDENT_AMBULATORY_CARE_PROVIDER_SITE_OTHER): Payer: Medicare Other | Admitting: Internal Medicine

## 2023-03-21 ENCOUNTER — Encounter: Payer: Self-pay | Admitting: Internal Medicine

## 2023-03-21 VITALS — BP 110/70 | HR 100 | Temp 98.0°F | Resp 16 | Ht 64.0 in | Wt 174.0 lb

## 2023-03-21 DIAGNOSIS — E782 Mixed hyperlipidemia: Secondary | ICD-10-CM

## 2023-03-21 DIAGNOSIS — E1142 Type 2 diabetes mellitus with diabetic polyneuropathy: Secondary | ICD-10-CM

## 2023-03-21 DIAGNOSIS — I2510 Atherosclerotic heart disease of native coronary artery without angina pectoris: Secondary | ICD-10-CM

## 2023-03-21 DIAGNOSIS — I10 Essential (primary) hypertension: Secondary | ICD-10-CM | POA: Diagnosis not present

## 2023-03-21 NOTE — Patient Instructions (Addendum)
Check the  blood pressure regularly BP GOAL is between 100/65 and  135/85. If it is consistently higher or lower, let me know   GO TO THE LAB : Get the blood work     GO TO THE FRONT DESK, PLEASE SCHEDULE YOUR APPOINTMENTS Come back for a checkup in 3 months

## 2023-03-21 NOTE — Progress Notes (Unsigned)
Subjective:    Patient ID: [name], female    DOB: [birth year], 88 y.o.   MRN: [id number]  DOS:  03/21/2023 Type of visit - description: Routine checkup  Routine visit. Here with her son. The son reports that in the last few months was admitted twice to a [facility] with chest pain, she ruled out for MI, outpatient stress test was negative.  Medications and  recent/available labs reviewed.  He still has bouts of diarrhea and constipation as well as hemorrhoidal bleed.  Reports has a  "Sensation" for a while, located at the R shoulder, happens most days, last few hours. Despite multiple questions I have not been able to get her to define the sensation any better.  Review of Systems See above   Past Medical History:  Diagnosis Date   Allergic rhinitis    Arthritis    "back; ankles; hands; knees" (10/31/2013)   Bilateral sensorineural hearing loss    CAD (coronary artery disease)    a. Nonobst in 2011. b. Abnormal nuc 09/2013 -> s/p cutting balloon to D2, mild LAD disease; c. 08/2015 MV: no ischemia, EF 71%.   Carcinoma in situ in a polyp 1994   a. 1994 - malignant polyp removed during colonoscopy.   Chronic diastolic CHF (congestive heart failure) (HCC)    a. 09/2013 Echo: EF 60-65%, mild LVH, Gr1 DD.   Dementia (HCC)    Diverticulosis    DJD (degenerative joint disease)    Fatty liver    GERD (gastroesophageal reflux disease)    a. Hx GERD/esophageal dysmotility followed by Dr. Juanda Chance.    Hemorrhoids    Hiatal hernia    a. s/p Nissen fundoplication 2011.   Hyperlipidemia    a. patient unwilling to use statins.   Hypertensive heart disease    IBS (irritable bowel syndrome)    Macular degeneration 03/2009   Dr. Earlene Plater   Neuropathy    a. Hands, feet, legs.   Orthostatic hypotension    Osteomyelitis (HCC)    a. 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 01/2016 s/p RLE transtibial amputation 2/2  Charcot rocker-bottom deformity and insensate neuropathy ulceration.   Osteoporosis    PE (pulmonary embolism) 12/2007   a. PE/DVT after neck surgery 2009. b. coumadin d/c 10-2008.   PONV (postoperative nausea and vomiting) 2009   neck surgery   PPD positive    Recurrent UTI    RSD (reflex sympathetic dystrophy)    a. Chronic pain.   Spinal stenosis    Type II diabetes mellitus (HCC)    no on medication   Venous insufficiency    a. Contributing to LEE.    Past Surgical History:  Procedure Laterality Date   AMPUTATION  03/06/2012   Procedure: AMPUTATION FOOT;  Surgeon: Nadara Mustard, MD;  Location: [facility] OR;  Service: Orthopedics;  Laterality: Left;  FIFTH RAY AMPUTATION    AMPUTATION Right 02/18/2016   Procedure: AMPUTATION BELOW KNEE;  Surgeon: Nadara Mustard, MD;  Location: [facility] OR;  Service: Orthopedics;  Laterality: Right;   AMPUTATION Left 11/22/2016   Procedure: Amputation 4th Toe Left Foot at Metatarsophalangeal Joint;  Surgeon: Nadara Mustard, MD;  Location: [facility] OR;  Service: Orthopedics;  Laterality: Left;   ANKLE FUSION  09/27/2012   Procedure: ANKLE FUSION;  Surgeon: Nadara Mustard, MD;  Location: [facility] OR;  Service: Orthopedics;  Laterality: Left;  Left Tibiocalcaneal Fusion   ANKLE FUSION Right  05/09/2013   Procedure: ANKLE FUSION;  Surgeon: Nadara Mustard, MD;  Location: [facility] OR;  Service: Orthopedics;  Laterality: Right;  Excision Osteomyelitis Base 1st MT Right Foot, Fusion Medial Column   ANTERIOR CERVICAL DECOMP/DISCECTOMY FUSION  12/18/07   For OA,  Dr. Ophelia Charter:  fu by a PE   ANTERIOR CERVICAL DECOMP/DISCECTOMY FUSION N/A 11/29/2020   Procedure: Anterior Cervical Decompression Fusion - Cervical Three-Cerivcal Four;  Surgeon: Julio Sicks, MD;  Location: [facility] OR;  Service: Neurosurgery;  Laterality: N/A;  3C   CARDIAC CATHETERIZATION  05/2010    at [facility] TUNNEL RELEASE Left 09/2011   CATARACT EXTRACTION W/ INTRAOCULAR LENS  IMPLANT, BILATERAL  2004   feb 2004 left, aug 2004  right   CHOLECYSTECTOMY  03/2002   CORONARY ANGIOPLASTY  10/31/2013   CORONARY ARTERY BYPASS GRAFT  09/27/2016   CYST REMOVAL HAND  06/2003   FOOT BONE EXCISION Right 06/2009   LAPAROSCOPIC RIGHT HEMI COLECTOMY N/A 01/08/2015   Procedure: LAPAROSCOPIC ASSISTED RIGHT HEMI COLECTOMY;  Surgeon: Luretha Murphy, MD;  Location: [facility] ORS;  Service: General;  Laterality: N/A;   LEFT HEART CATHETERIZATION WITH CORONARY ANGIOGRAM N/A 10/31/2013   Procedure: LEFT HEART CATHETERIZATION WITH CORONARY ANGIOGRAM;  Surgeon: Corky Crafts, MD;  Location: [facility] CATH LAB;  Service: Cardiovascular;  Laterality: N/A;   NASAL SEPTUM SURGERY  09/1963   NISSEN FUNDOPLICATION  01/25/2010   PERCUTANEOUS CORONARY INTERVENTION-BALLOON ONLY  10/31/2013   Procedure: PERCUTANEOUS CORONARY INTERVENTION-BALLOON ONLY;  Surgeon: Corky Crafts, MD;  Location: [facility] CATH LAB;  Service: Cardiovascular;;   ROTATOR CUFF REPAIR Right 07/2007   Dr. Gwenith Spitz CUFF REPAIR Left 11/2004   stent in left leg, behind knee  06/27/2017   x2, done at [facility] cardiology in [facility]   TOE AMPUTATION Right 08/2008   3rd toe, Dr. Lajoyce Corners due to osteomyelitis   VAGINAL HYSTERECTOMY  03/1975   VEIN LIGATION Bilateral 03/1966   VENA CAVA FILTER PLACEMENT  01/2010   green filter; "due to blood clots"   WISDOM TOOTH EXTRACTION  06/2007    Current Outpatient Medications  Medication Instructions   Alpha-Lipoic Acid 1,200 mg, Oral, Daily   amitriptyline (ELAVIL) 25 mg, Oral, Daily at bedtime   atorvastatin (LIPITOR) 40 mg, Oral, Daily   carvedilol (COREG) 12.5 mg, Oral, 2 times daily with meals   CINNAMON PO 2,000 mg, Oral, Daily   clopidogrel (PLAVIX) 75 mg, Oral, Daily at bedtime, Restart on 12/04/2020   Cranberry 200 MG CAPS    dextromethorphan-guaiFENesin (TUSSIN DM) 10-100 MG/5ML liquid Oral, Every 4 hours PRN   diclofenac Sodium (VOLTAREN) 4 g, Topical, 4 times daily   dicyclomine (BENTYL) 10 MG capsule TAKE 1 CAPSULE THREE TIMES A DAY  BEFORE MEALS (NEED APPOINTMENT)   donepezil (ARICEPT) 10 mg, Oral, Daily at bedtime   famotidine (PEPCID) 20 mg, Oral, 2 times daily   furosemide (LASIX) 20 mg, Oral, Daily   glucose blood (FREESTYLE LITE) test strip USE TO CHECK BLOOD SUGAR NO MORE THAN TWICE DAILY   HYDROcodone-acetaminophen (NORCO) 10-325 MG tablet 1 tablet, Oral, Every 6 hours PRN   hydrocortisone (ANUSOL-HC) 2.5 % rectal cream 1 Application, Rectal, 2 times daily, As needed   hydrocortisone (ANUSOL-HC) 25 mg, Rectal, 2 times daily, As needed   hydrocortisone 2.5 % cream APPLY TOPICALLY TO THE AFFECTED AREA TWICE DAILY   ketoconazole (NIZORAL) 2 % cream 1 application , Topical, Daily   Lancets (FREESTYLE) lancets CHECK BLOOD SUGAR  TWICE DAILY   linaclotide (LINZESS) 72 mcg, Oral, Daily before breakfast   loperamide (IMODIUM A-D) 2 mg, Oral, As needed   MegaRed Omega-3 Krill Oil 500 MG CAPS Restart on 12/04/2020   methenamine (HIPREX) 1 g tablet 0.5 tablets, Oral, Daily   Multiple Vitamins-Minerals (PRESERVISION AREDS 2) CAPS 1 capsule, Oral, 2 times daily   naloxone (NARCAN) nasal spray 4 mg/0.1 mL 1 spray, Nasal, Once PRN   nitroGLYCERIN (NITROSTAT) 0.4 mg, Sublingual, Every 5 min x3 PRN   omeprazole (PRILOSEC) 20 MG capsule TAKE 1 CAPSULE TWICE A DAY BEFORE MEALS   ondansetron (ZOFRAN) 4 MG tablet TAKE 1 TABLET(4 MG) BY MOUTH EVERY 8 HOURS AS NEEDED FOR NAUSEA OR VOMITING   OVER THE COUNTER MEDICATION 1 tablet, 3 times daily, Berberorine Gluco Defense    Polyvinyl Alcohol-Povidone (REFRESH OP) 1 drop, Both Eyes, 3 times daily PRN   pramipexole (MIRAPEX) 0.125-0.25 mg, Oral, At bedtime PRN   Probiotic Product (PROBIOTIC PEARLS PO) 1 tablet, Oral, Daily   rivaroxaban (XARELTO) 2.5 mg, Oral, 2 times daily, Restart on 12/04/2020   silver sulfADIAZINE (SILVADENE) 1 % cream 1 application , Topical, Daily   Vitamin B-12 5,000 mcg, Sublingual, Daily   vitamin C with rose hips 500 mg, Oral, 2 times daily   Vitamin D3 5,000  Units, Oral, Every evening, Reported on 11/24/2015   vitamin E 400 Units, Oral, Daily   Zinc Sulfate 66 MG TABS        Objective:   Physical Exam BP 110/70 (BP Location: Left Arm, Patient Position: Sitting, Cuff Size: Normal)   Pulse 100   Temp 98 F (36.7 C) (Oral)   Resp 16   Ht 5\' 4"  (1.626 m)   Wt 174 lb (78.9 kg)   SpO2 99%   BMI 29.87 kg/m  General:   Well developed, NAD, BMI noted. HEENT:  Normocephalic . Face symmetric, atraumatic neck: Range of motion seems satisfactory, slightly TTP at the proximal T-spine. Lungs:  CTA B Normal respiratory effort, no intercostal retractions, no accessory muscle use. Heart: RRR,  no murmur.  Skin: Not pale. Not jaundice Neurologic:  alert, cooperative, follows commands.  Has dementia, no formal mental exam is performed today but she has some difficulty finding words and explaining symptoms. Speech normal, gait: Not tested. Face symmetric.  Upper extremity motor symmetric. Psych--  Cooperative with normal attention span and concentration.  Behavior appropriate.       Assessment    ASSESSMENT DM, Neuropathy, amputations due to Charcot feet. - metformin intolerant  (lethargic) HTN  Hyperlipidemia LUNG MASS -incidental per CT 2009,  PET scan 01-2008: likely  benign, CT 08-2009 no change, no further CTs per Dr Shelle Iron - Had a CT in [place] 09-2016 "spiculated mass", saw Dr Delton Coombes,  CT  09/2017 stable CV: ---CAD cardiologist in [facility] --NSTEMI 09-2016. Outside hospital: Cath 2 vessel disease, CABG 2, LIMA to LAD and Diag   ---Chronic diastolic CHF ---PVD: Aortogram and a stent L Leg Dr Docia Barrier, 06-27-2017 @ [place] (per pt) GI: GERD, IBS, colon polyps, PUD, abdominal pain "post polypectomy syndrome" [place] meds prn (antivert-zofran, gets nausea when car traveling) Osteoporosis  MSK,pain mngmt : --Reflex sympathetic dystrophy --Neuropathy --DJD --Spinal stenosis  --H/o Osteomyelitis  Foot --s/p amputation:  4th 5th L toes , R  BKA  (01-2016, dx osteomyelitis) w/ phantom pain  -- sees Dr Lajoyce Corners prn She took oxycodone after surgery and that did NOT seem to help better than hydrocodone. Intolerant to Cymbalta, Neurontin ;  Lyrica helped but caused edema. Recurrent UTIs: Dr Mena Goes  Venous insufficiency Dementia: MMSE 23 (January 2023) H/o  + PPD H/o hypothyroidism: TSHs stable H/o dyspnea   PLAN:  DM: Diet controlled, check A1c HTN: BP was in the low side when she arrived, recheck systolic BP was 110. Reports that at home BP has been running low, cardiology is aware  and they did not suggest any changes. Continue carvedilol, Lasix, check a BMP.  Monitor BPs at home, if consistently less than 100 recommend to reach out to cardiology Hyperlipidemia: On atorvastatin, check FLP CAD: Per patient's son, recently admitted to outside [facility] twice with chest pain, eventually stress test was negative. IDA, thrombocytopenia: Saw hematology 12/26/2022 hemoglobin increased to 10.5.  Deficiency felt to be due to GI (has hemorrhoidal bleed), platelets low at 135. Constipation: Chronic issue, we had a long conversation about it.  Pt's son has many questions, he is using Linzess as needed because consistent use cause loose stools,  and  then the patient request something for diarrhea.  Recommend to use Linzess twice a week but the patient's son prefers to do it as needed only. RTC 3 months   Time spent 35 minutes.  The pt's son is frustrated about [employer]  asking for medication for diarrhea and constipation frequently. Also spent a fair amount of time trying to figure out what she meant by a sensation of the right shoulder but despite multiple questions I could not clarify the issue.

## 2023-03-22 ENCOUNTER — Telehealth: Payer: Self-pay | Admitting: Internal Medicine

## 2023-03-22 LAB — BASIC METABOLIC PANEL
BUN: 20 mg/dL (ref 6–23)
CO2: 23 mEq/L (ref 19–32)
Calcium: 8.2 mg/dL — ABNORMAL LOW (ref 8.4–10.5)
Chloride: 110 mEq/L (ref 96–112)
Creatinine, Ser: 1.22 mg/dL — ABNORMAL HIGH (ref 0.40–1.20)
GFR: 40.39 mL/min — ABNORMAL LOW (ref >60.00)
Glucose, Bld: 89 mg/dL (ref 70–99)
Potassium: 4.3 mEq/L (ref 3.5–5.1)
Sodium: 141 mEq/L (ref 135–145)

## 2023-03-22 LAB — LIPID PANEL
Cholesterol: 75 mg/dL (ref 0–200)
HDL: 39.9 mg/dL (ref >39.00)
LDL Cholesterol: 15 mg/dL (ref 0–99)
NonHDL: 34.97
Total CHOL/HDL Ratio: 2
Triglycerides: 100 mg/dL (ref 0.0–149.0)
VLDL: 20 mg/dL (ref 0.0–40.0)

## 2023-03-22 LAB — HEMOGLOBIN A1C: Hgb A1c MFr Bld: 5.2 % (ref 4.6–6.5)

## 2023-03-22 MED ORDER — HYDROCODONE-ACETAMINOPHEN 10-325 MG PO TABS: 1.0000 | ORAL_TABLET | Freq: Four times a day (QID) | ORAL | 0 refills | Status: DC | PRN

## 2023-03-22 NOTE — Addendum Note (Signed)
Addended by: [name] E on: 03/22/2023 12:01 PM   Modules accepted: Orders

## 2023-03-22 NOTE — Addendum Note (Signed)
Addended by: Conrad Cloverdale D on: 03/22/2023 11:33 AM   Modules accepted: Orders

## 2023-03-22 NOTE — Telephone Encounter (Signed)
Pt seen yesterday, needing refill on hydrocodone.

## 2023-03-22 NOTE — Telephone Encounter (Signed)
Pdmp ok, rf sent  

## 2023-03-22 NOTE — Assessment & Plan Note (Signed)
DM: Diet controlled, check A1c HTN: BP was in the low side when she arrived, recheck systolic BP was 110. Reports that at home BP has been running low, cardiology is aware  and they did not suggest any changes. Continue carvedilol, Lasix, check a BMP.  Monitor BPs at home, if consistently less than 100 recommend to reach out to cardiology Hyperlipidemia: On atorvastatin, check FLP CAD: Per patient's son, recently admitted to outside [facility] twice with chest pain, eventually stress test was negative. IDA, thrombocytopenia: Saw hematology 12/26/2022 hemoglobin increased to 10.5.  Deficiency felt to be due to GI (has hemorrhoidal bleed), platelets low at 135. Constipation: Chronic issue, we had a long conversation about it.  Pt's son has many questions, he is using Linzess as needed because consistent use cause loose stools,  and  then the patient request something for diarrhea.  Recommend to use Linzess twice a week but the patient's son prefers to do it as needed only. RTC 3 months

## 2023-03-22 NOTE — Telephone Encounter (Signed)
Prescription Request  03/22/2023  Is this a "Controlled Substance" medicine? Yes  LOV: 03/21/2023  What is the name of the medication or equipment? HYDROcodone-acetaminophen (NORCO) 10-325 MG table   Have you contacted your pharmacy to request a refill? No   Which pharmacy would you like this sent to?    [facility][id number] [place], [place] - [address] AT [facility] OF [facility] [address] & [facility] [address] [place] [address] Phone: [phone] Fax: [phone]   Patient notified that their request is being sent to the clinical staff for review and that they should receive a response within 2 business days.   Please advise at Mobile [phone] (mobile)

## 2023-03-29 ENCOUNTER — Telehealth: Payer: Self-pay | Admitting: Internal Medicine

## 2023-03-29 NOTE — Telephone Encounter (Signed)
Pt's son has called stating in the fold of her abdomen, the skin may have gotten wet and is started to degrade. Stated this has happened before and he cannot remember the name of the cream that was used but they are wondering if they can get it again sent over to :  [facility] #[id number] - [place], [place] - [address] AT [facility] OF [address] & [address]   Please advise.

## 2023-03-30 MED ORDER — KETOCONAZOLE 2 % EX CREA: 1.0000 | TOPICAL_CREAM | Freq: Two times a day (BID) | CUTANEOUS | 0 refills | Status: DC

## 2023-03-30 NOTE — Telephone Encounter (Signed)
Please advise 

## 2023-03-30 NOTE — Telephone Encounter (Signed)
Spoke w/ Pt's [name]- informed of recommendations. [name] verbalized understanding. Rx sent to [facility].

## 2023-03-30 NOTE — Telephone Encounter (Signed)
Ketoconazole cream ready to be send. Advised patient's son: Is probably a fungal infection, try the cream twice daily for few days. In addition needs to keep the area dry.  Could try powder or talc once she is better. If major redness, fever, swelling: Needs to be seen.  Cellulitis?

## 2023-04-04 DIAGNOSIS — E785 Hyperlipidemia, unspecified: Secondary | ICD-10-CM | POA: Diagnosis not present

## 2023-04-04 DIAGNOSIS — E1152 Type 2 diabetes mellitus with diabetic peripheral angiopathy with gangrene: Secondary | ICD-10-CM | POA: Diagnosis not present

## 2023-04-04 DIAGNOSIS — I10 Essential (primary) hypertension: Secondary | ICD-10-CM | POA: Diagnosis not present

## 2023-04-04 DIAGNOSIS — M79605 Pain in left leg: Secondary | ICD-10-CM | POA: Diagnosis not present

## 2023-04-04 DIAGNOSIS — I70244 Atherosclerosis of native arteries of left leg with ulceration of heel and midfoot: Secondary | ICD-10-CM | POA: Diagnosis not present

## 2023-04-04 DIAGNOSIS — L8990 Pressure ulcer of unspecified site, unspecified stage: Secondary | ICD-10-CM | POA: Diagnosis not present

## 2023-04-09 ENCOUNTER — Other Ambulatory Visit: Payer: Self-pay | Admitting: Internal Medicine

## 2023-04-09 DIAGNOSIS — B351 Tinea unguium: Secondary | ICD-10-CM | POA: Diagnosis not present

## 2023-04-09 DIAGNOSIS — L89622 Pressure ulcer of left heel, stage 2: Secondary | ICD-10-CM | POA: Diagnosis not present

## 2023-04-09 DIAGNOSIS — Z89511 Acquired absence of right leg below knee: Secondary | ICD-10-CM | POA: Diagnosis not present

## 2023-04-09 DIAGNOSIS — E1151 Type 2 diabetes mellitus with diabetic peripheral angiopathy without gangrene: Secondary | ICD-10-CM | POA: Diagnosis not present

## 2023-04-09 DIAGNOSIS — I10 Essential (primary) hypertension: Secondary | ICD-10-CM | POA: Diagnosis not present

## 2023-04-09 DIAGNOSIS — L603 Nail dystrophy: Secondary | ICD-10-CM | POA: Diagnosis not present

## 2023-04-09 DIAGNOSIS — E11621 Type 2 diabetes mellitus with foot ulcer: Secondary | ICD-10-CM | POA: Diagnosis not present

## 2023-04-09 DIAGNOSIS — M2042 Other hammer toe(s) (acquired), left foot: Secondary | ICD-10-CM | POA: Diagnosis not present

## 2023-04-09 DIAGNOSIS — G43009 Migraine without aura, not intractable, without status migrainosus: Secondary | ICD-10-CM

## 2023-04-09 DIAGNOSIS — I70292 Other atherosclerosis of native arteries of extremities, left leg: Secondary | ICD-10-CM | POA: Diagnosis not present

## 2023-04-10 ENCOUNTER — Other Ambulatory Visit: Payer: Self-pay | Admitting: Gastroenterology

## 2023-04-10 ENCOUNTER — Telehealth: Payer: Self-pay | Admitting: Gastroenterology

## 2023-04-10 MED ORDER — DICYCLOMINE HCL 10 MG PO CAPS: ORAL_CAPSULE | ORAL | 0 refills | Status: DC

## 2023-04-10 NOTE — Telephone Encounter (Signed)
Inbound call from patient son requesting a refill for Dicyclomine patient was last seen December 2023

## 2023-04-10 NOTE — Telephone Encounter (Signed)
Refilled Dicyclomine and made an appointment for the patient on October 22 at 9:50 am. Patient must keep appointment before any further refills.

## 2023-04-16 DIAGNOSIS — H04123 Dry eye syndrome of bilateral lacrimal glands: Secondary | ICD-10-CM | POA: Diagnosis not present

## 2023-04-16 DIAGNOSIS — H18593 Other hereditary corneal dystrophies, bilateral: Secondary | ICD-10-CM | POA: Diagnosis not present

## 2023-04-16 LAB — HM DIABETES EYE EXAM

## 2023-04-17 DIAGNOSIS — I10 Essential (primary) hypertension: Secondary | ICD-10-CM | POA: Diagnosis not present

## 2023-04-17 DIAGNOSIS — M79605 Pain in left leg: Secondary | ICD-10-CM | POA: Diagnosis not present

## 2023-04-17 DIAGNOSIS — E785 Hyperlipidemia, unspecified: Secondary | ICD-10-CM | POA: Diagnosis not present

## 2023-04-17 DIAGNOSIS — E1152 Type 2 diabetes mellitus with diabetic peripheral angiopathy with gangrene: Secondary | ICD-10-CM | POA: Diagnosis not present

## 2023-04-17 DIAGNOSIS — I70245 Atherosclerosis of native arteries of left leg with ulceration of other part of foot: Secondary | ICD-10-CM | POA: Diagnosis not present

## 2023-04-18 ENCOUNTER — Other Ambulatory Visit: Payer: Self-pay | Admitting: Internal Medicine

## 2023-04-24 DIAGNOSIS — E785 Hyperlipidemia, unspecified: Secondary | ICD-10-CM | POA: Diagnosis not present

## 2023-04-24 DIAGNOSIS — I70213 Atherosclerosis of native arteries of extremities with intermittent claudication, bilateral legs: Secondary | ICD-10-CM | POA: Diagnosis not present

## 2023-04-24 DIAGNOSIS — E1152 Type 2 diabetes mellitus with diabetic peripheral angiopathy with gangrene: Secondary | ICD-10-CM | POA: Diagnosis not present

## 2023-04-24 DIAGNOSIS — I70244 Atherosclerosis of native arteries of left leg with ulceration of heel and midfoot: Secondary | ICD-10-CM | POA: Diagnosis not present

## 2023-04-30 DIAGNOSIS — E1152 Type 2 diabetes mellitus with diabetic peripheral angiopathy with gangrene: Secondary | ICD-10-CM | POA: Diagnosis not present

## 2023-04-30 DIAGNOSIS — I70245 Atherosclerosis of native arteries of left leg with ulceration of other part of foot: Secondary | ICD-10-CM | POA: Diagnosis not present

## 2023-04-30 DIAGNOSIS — I10 Essential (primary) hypertension: Secondary | ICD-10-CM | POA: Diagnosis not present

## 2023-04-30 DIAGNOSIS — E785 Hyperlipidemia, unspecified: Secondary | ICD-10-CM | POA: Diagnosis not present

## 2023-05-01 ENCOUNTER — Inpatient Hospital Stay: Payer: Medicare Other

## 2023-05-01 ENCOUNTER — Inpatient Hospital Stay: Payer: Medicare Other | Admitting: Family

## 2023-05-09 DIAGNOSIS — I70213 Atherosclerosis of native arteries of extremities with intermittent claudication, bilateral legs: Secondary | ICD-10-CM | POA: Diagnosis not present

## 2023-05-09 DIAGNOSIS — I70245 Atherosclerosis of native arteries of left leg with ulceration of other part of foot: Secondary | ICD-10-CM | POA: Diagnosis not present

## 2023-05-15 DIAGNOSIS — I70213 Atherosclerosis of native arteries of extremities with intermittent claudication, bilateral legs: Secondary | ICD-10-CM | POA: Diagnosis not present

## 2023-05-15 DIAGNOSIS — I70245 Atherosclerosis of native arteries of left leg with ulceration of other part of foot: Secondary | ICD-10-CM | POA: Diagnosis not present

## 2023-05-18 ENCOUNTER — Telehealth: Payer: Self-pay | Admitting: Internal Medicine

## 2023-05-18 MED ORDER — HYDROCODONE-ACETAMINOPHEN 10-325 MG PO TABS: 1.0000 | ORAL_TABLET | Freq: Four times a day (QID) | ORAL | 0 refills | Status: DC | PRN

## 2023-05-18 NOTE — Addendum Note (Signed)
Addended by: [name] E on: 05/18/2023 04:10 PM   Modules accepted: Orders

## 2023-05-18 NOTE — Telephone Encounter (Signed)
PDMP okay, Rx sent 

## 2023-05-18 NOTE — Telephone Encounter (Signed)
Requesting: hydrocodone  Contract: 11/21/22 UDS: 11/21/22 Last Visit: 03/21/23 Next Visit: 06/22/23 Last Refill: 03/22/23 #120 and 0RF   Please Advise

## 2023-05-18 NOTE — Telephone Encounter (Signed)
Patients son called and mom needs a med refill on  HYDROcodone-acetaminophen (NORCO) 10-325 MG tablet  Please send to [facility]

## 2023-05-18 NOTE — Addendum Note (Signed)
Addended byConrad [place]ce [place] D on: 05/18/2023 03:10 PM   Modules accepted: Orders

## 2023-05-19 ENCOUNTER — Other Ambulatory Visit: Payer: Self-pay | Admitting: Internal Medicine

## 2023-05-22 DIAGNOSIS — I70245 Atherosclerosis of native arteries of left leg with ulceration of other part of foot: Secondary | ICD-10-CM | POA: Diagnosis not present

## 2023-05-23 ENCOUNTER — Encounter (INDEPENDENT_AMBULATORY_CARE_PROVIDER_SITE_OTHER): Payer: Self-pay

## 2023-05-24 ENCOUNTER — Telehealth: Payer: Self-pay

## 2023-05-24 ENCOUNTER — Telehealth: Payer: Self-pay | Admitting: *Deleted

## 2023-05-24 NOTE — Telephone Encounter (Signed)
Pt ready for scheduling for PROLIA on or after : 05/24/23  Out-of-pocket cost due at time of visit: $0  Primary: MEDICARE Prolia co-insurance: 0% Admin fee co-insurance: 0%  Secondary: TRICARE Prolia co-insurance:  Admin fee co-insurance:   Medical Benefit Details: Date Benefits were checked: 05/24/23 Deductible: $240 MET OF $240 REQUIRED/ Coinsurance: 0%/ Admin Fee: 0%  Prior Auth: N/A PA# Expiration Date:   # of doses approved:  Pharmacy benefit: Copay $--- If patient wants fill through the pharmacy benefit please send prescription to:  --- , and include estimated need by date in rx notes. Pharmacy will ship medication directly to the office.  Patient NOT eligible for Prolia Copay Card. Copay Card can make patient's cost as little as $25. Link to apply: https://www.amgensupportplus.[email or web address]/copay  ** This summary of benefits is an estimation of the patient's out-of-pocket cost. Exact cost may very based on individual plan coverage.

## 2023-05-24 NOTE — Telephone Encounter (Signed)
Prolia VOB initiated via [email or web address]   Last Prolia inj: 10/03/22 Next Prolia inj DUE: NOW

## 2023-05-24 NOTE — Telephone Encounter (Signed)
Created new encounter for Prolia BIV. Will route encounter back once benefit verification is complete.  

## 2023-05-24 NOTE — Telephone Encounter (Signed)
Last Prolia given 10/03/22

## 2023-05-25 ENCOUNTER — Telehealth: Payer: Self-pay | Admitting: *Deleted

## 2023-05-25 DIAGNOSIS — I70213 Atherosclerosis of native arteries of extremities with intermittent claudication, bilateral legs: Secondary | ICD-10-CM | POA: Diagnosis not present

## 2023-05-25 NOTE — Telephone Encounter (Signed)
Left detailed message on machine to call back to schedule Prolia injection.

## 2023-05-25 NOTE — Telephone Encounter (Signed)
Left detailed message on machine to call back to schedule Prolia injection.  $0  OOP

## 2023-05-29 NOTE — Telephone Encounter (Signed)
Pt scheduled for 06/20/23

## 2023-05-30 DIAGNOSIS — I70245 Atherosclerosis of native arteries of left leg with ulceration of other part of foot: Secondary | ICD-10-CM | POA: Diagnosis not present

## 2023-06-01 DIAGNOSIS — I70213 Atherosclerosis of native arteries of extremities with intermittent claudication, bilateral legs: Secondary | ICD-10-CM | POA: Diagnosis not present

## 2023-06-06 DIAGNOSIS — I70244 Atherosclerosis of native arteries of left leg with ulceration of heel and midfoot: Secondary | ICD-10-CM | POA: Diagnosis not present

## 2023-06-10 ENCOUNTER — Other Ambulatory Visit: Payer: Self-pay | Admitting: Internal Medicine

## 2023-06-11 ENCOUNTER — Telehealth: Payer: Self-pay | Admitting: *Deleted

## 2023-06-11 ENCOUNTER — Encounter: Payer: Self-pay | Admitting: Internal Medicine

## 2023-06-11 MED ORDER — DICYCLOMINE HCL 10 MG PO CAPS: ORAL_CAPSULE | ORAL | 2 refills | Status: DC

## 2023-06-11 NOTE — Telephone Encounter (Signed)
Received fax request from [facility] for 90 day supply of Dicyclomine , sent rx in electronically

## 2023-06-13 DIAGNOSIS — I70245 Atherosclerosis of native arteries of left leg with ulceration of other part of foot: Secondary | ICD-10-CM | POA: Diagnosis not present

## 2023-06-13 DIAGNOSIS — I70244 Atherosclerosis of native arteries of left leg with ulceration of heel and midfoot: Secondary | ICD-10-CM | POA: Diagnosis not present

## 2023-06-18 ENCOUNTER — Encounter: Payer: Self-pay | Admitting: Medical Oncology

## 2023-06-18 ENCOUNTER — Inpatient Hospital Stay: Payer: Medicare Other | Attending: Family

## 2023-06-18 ENCOUNTER — Inpatient Hospital Stay (HOSPITAL_BASED_OUTPATIENT_CLINIC_OR_DEPARTMENT_OTHER): Payer: Medicare Other | Admitting: Medical Oncology

## 2023-06-18 ENCOUNTER — Other Ambulatory Visit: Payer: Self-pay

## 2023-06-18 VITALS — BP 126/56 | HR 81 | Temp 98.2°F | Resp 18 | Ht 65.5 in | Wt 167.0 lb

## 2023-06-18 DIAGNOSIS — D709 Neutropenia, unspecified: Secondary | ICD-10-CM | POA: Diagnosis not present

## 2023-06-18 DIAGNOSIS — D500 Iron deficiency anemia secondary to blood loss (chronic): Secondary | ICD-10-CM | POA: Insufficient documentation

## 2023-06-18 DIAGNOSIS — D649 Anemia, unspecified: Secondary | ICD-10-CM

## 2023-06-18 DIAGNOSIS — D696 Thrombocytopenia, unspecified: Secondary | ICD-10-CM

## 2023-06-18 DIAGNOSIS — D509 Iron deficiency anemia, unspecified: Secondary | ICD-10-CM | POA: Diagnosis not present

## 2023-06-18 DIAGNOSIS — K922 Gastrointestinal hemorrhage, unspecified: Secondary | ICD-10-CM | POA: Diagnosis not present

## 2023-06-18 LAB — CBC WITH DIFFERENTIAL (CANCER CENTER ONLY)
Abs Immature Granulocytes: 0.03 10*3/uL (ref 0.00–0.07)
Basophils Absolute: 0 10*3/uL (ref 0.0–0.1)
Basophils Relative: 0 %
Eosinophils Absolute: 0.1 10*3/uL (ref 0.0–0.5)
Eosinophils Relative: 3 %
HCT: 30.6 % — ABNORMAL LOW (ref 36.0–46.0)
Hemoglobin: 9.4 g/dL — ABNORMAL LOW (ref 12.0–15.0)
Immature Granulocytes: 1 %
Lymphocytes Relative: 43 %
Lymphs Abs: 1.3 10*3/uL (ref 0.7–4.0)
MCH: 29.6 pg (ref 26.0–34.0)
MCHC: 30.7 g/dL (ref 30.0–36.0)
MCV: 96.2 fL (ref 80.0–100.0)
Monocytes Absolute: 0.2 10*3/uL (ref 0.1–1.0)
Monocytes Relative: 8 %
Neutro Abs: 1.3 10*3/uL — ABNORMAL LOW (ref 1.7–7.7)
Neutrophils Relative %: 45 %
Platelet Count: 116 10*3/uL — ABNORMAL LOW (ref 150–400)
RBC: 3.18 MIL/uL — ABNORMAL LOW (ref 3.87–5.11)
RDW: 15.2 % (ref 11.5–15.5)
WBC Count: 2.9 10*3/uL — ABNORMAL LOW (ref 4.0–10.5)
nRBC: 0 % (ref 0.0–0.2)

## 2023-06-18 LAB — RETICULOCYTES
Immature Retic Fract: 13.6 % (ref 2.3–15.9)
RBC.: 3.21 MIL/uL — ABNORMAL LOW (ref 3.87–5.11)
Retic Count, Absolute: 32.4 10*3/uL (ref 19.0–186.0)
Retic Ct Pct: 1 % (ref 0.4–3.1)

## 2023-06-18 LAB — FERRITIN: Ferritin: 10 ng/mL — ABNORMAL LOW (ref 11–307)

## 2023-06-18 NOTE — Progress Notes (Unsigned)
Hematology and Oncology Follow Up Visit  [name] [id number] [birth year] 88 y.o. 06/18/2023   Principle Diagnosis:  Iron deficiency anemia  Mild thrombocytopenia  Mild Neutropenia   Current Therapy:        IV iron as indicated    Interim History:  [name] is here today with her son for follow-up  They report that she has been doing ok since her last visit. Appetite is a bit lower but overall ok.  Wound of the left foot is still healing.   No fever, chills, n/v, cough, rash, dizziness, SOB, chest pain, palpitations, abdominal pain or changes in bladder habits at this time.  No issue with blood loss. No abnormal bruising. No petechiae.   Wt Readings from Last 3 Encounters:  06/18/23 167 lb 0.6 oz (75.8 kg)  03/21/23 174 lb (78.9 kg)  11/21/22 174 lb 4 oz (79 kg)   ECOG Performance Status: 2 - Symptomatic, <50% confined to bed  Medications:  Allergies as of 06/18/2023       Reactions   Metformin And Related Diarrhea, Nausea Only, Other (See Comments)   dizzy, tired, chills   Cymbalta [duloxetine Hcl] Swelling   Swelling in legs   Gabapentin Swelling   Swelling in legs   Silicone Hives, Itching, Dermatitis, Rash   Tramadol Swelling   Adhesive [tape] Rash   Cefazolin Hives      Ciprofloxacin Other (See Comments)   body aches   Clorazepate Dipotassium Other (See Comments)   Unknown reaction   Dilaudid [hydromorphone Hcl] Itching    confused,   Doxycycline Rash   Enablex [darifenacin Hydrobromide Er] Other (See Comments)   Hypotension, near syncope   Levofloxacin Other (See Comments)   Causes wrist pain   Lyrica [pregabalin] Swelling   Swelling in legs   Methadone Hcl Other (See Comments)   Reaction unknown   Morphine And Codeine Other (See Comments)   Confusion, constipation.   Talwin [pentazocine] Other (See Comments)   "climbing walls" anxiety        Medication List        Accurate as of June 18, 2023  3:48 PM. If you have any questions, ask  your nurse or doctor.          Alpha-Lipoic Acid 600 MG Caps Take 1,200 mg by mouth daily.   amitriptyline 25 MG tablet Commonly known as: ELAVIL Take 1 tablet (25 mg total) by mouth at bedtime.   atorvastatin 40 MG tablet Commonly known as: LIPITOR Take 40 mg by mouth daily.   carvedilol 12.5 MG tablet Commonly known as: COREG Take 12.5 mg by mouth 2 (two) times daily with a meal.   CINNAMON PO Take 2,000 mg by mouth daily.   clopidogrel 75 MG tablet Commonly known as: PLAVIX Take 1 tablet (75 mg total) by mouth at bedtime. Restart on 12/04/2020   Cranberry 200 MG Caps   diclofenac Sodium 1 % Gel Commonly known as: VOLTAREN Apply 4 g topically 4 (four) times daily. What changed: additional instructions   dicyclomine 10 MG capsule Commonly known as: BENTYL TAKE 1 CAPSULE BY MOUTH THREE TIMES DAILY BEFORE MEALS   donepezil 10 MG tablet Commonly known as: ARICEPT TAKE 1 TABLET AT BEDTIME   famotidine 20 MG tablet Commonly known as: PEPCID Take 1 tablet (20 mg total) by mouth 2 (two) times daily.   freestyle lancets CHECK BLOOD SUGAR TWICE DAILY   FREESTYLE LITE test strip Generic drug: glucose blood USE TO CHECK BLOOD  SUGAR NO MORE THAN TWICE DAILY   furosemide 20 MG tablet Commonly known as: LASIX Take 20 mg by mouth daily.   HYDROcodone-acetaminophen 10-325 MG tablet Commonly known as: NORCO Take 1 tablet by mouth every 6 (six) hours as needed.   hydrocortisone 2.5 % cream APPLY TOPICALLY TO THE AFFECTED AREA TWICE DAILY   hydrocortisone 2.5 % rectal cream Commonly known as: ANUSOL-HC Place 1 Application rectally 2 (two) times daily. As needed   hydrocortisone 25 MG suppository Commonly known as: ANUSOL-HC Place 1 suppository (25 mg total) rectally 2 (two) times daily. As needed   ketoconazole 2 % cream Commonly known as: NIZORAL Apply 1 Application topically 2 (two) times daily.   linaclotide 72 MCG capsule Commonly known as: Linzess Take  1 capsule (72 mcg total) by mouth daily before breakfast.   loperamide 2 MG tablet Commonly known as: IMODIUM A-D Take 2 mg by mouth as needed for diarrhea or loose stools.   MegaRed Omega-3 Krill Oil 500 MG Caps Restart on 12/04/2020   methenamine 1 g tablet Commonly known as: HIPREX Take 0.5 tablets by mouth daily.   naloxone 4 MG/0.1ML Liqd nasal spray kit Commonly known as: NARCAN Place 1 spray into the nose once as needed for up to 1 dose.   nitroGLYCERIN 0.4 MG SL tablet Commonly known as: NITROSTAT Place 1 tablet (0.4 mg total) under the tongue every 5 (five) minutes x 3 doses as needed for chest pain.   omeprazole 20 MG capsule Commonly known as: PRILOSEC TAKE 1 CAPSULE TWICE A DAY BEFORE MEALS   ondansetron 4 MG tablet Commonly known as: ZOFRAN Take 1 tablet (4 mg total) by mouth every 8 (eight) hours as needed for nausea or vomiting.   OVER THE COUNTER MEDICATION 1 tablet 3 (three) times daily. Berberorine Gluco Defense   pramipexole 0.125 MG tablet Commonly known as: MIRAPEX Take 1-2 tablets (0.125-0.25 mg total) by mouth at bedtime as needed.   PreserVision AREDS 2 Caps Take 1 capsule by mouth 2 (two) times daily.   PROBIOTIC PEARLS PO Take 1 tablet by mouth daily.   REFRESH OP Place 1 drop into both eyes 3 (three) times daily as needed (dry eyes).   rivaroxaban 2.5 MG Tabs tablet Commonly known as: XARELTO Take 1 tablet (2.5 mg total) by mouth 2 (two) times daily. Restart on 12/04/2020   silver sulfADIAZINE 1 % cream Commonly known as: Silvadene Apply 1 application topically daily.   Tussin DM 10-100 MG/5ML liquid Generic drug: dextromethorphan-guaiFENesin Take by mouth every 4 (four) hours as needed for cough.   Vitamin B-12 5000 MCG Subl Place 5,000 mcg under the tongue daily.   vitamin C with rose hips 500 MG tablet Take 500 mg by mouth in the morning and at bedtime.   Vitamin D3 125 MCG (5000 UT) Caps Take 5,000 Units by mouth every  evening. Reported on 11/24/2015   vitamin E 180 MG (400 UNITS) capsule Take 400 Units by mouth daily.   Zinc Sulfate 66 MG Tabs        Allergies:  Allergies  Allergen Reactions   Metformin And Related Diarrhea, Nausea Only and Other (See Comments)    dizzy, tired, chills   Cymbalta [Duloxetine Hcl] Swelling    Swelling in legs   Gabapentin Swelling    Swelling in legs   Silicone Hives, Itching, Dermatitis and Rash   Tramadol Swelling   Adhesive [Tape] Rash   Cefazolin Hives        Ciprofloxacin  Other (See Comments)    body aches    Clorazepate Dipotassium Other (See Comments)    Unknown reaction   Dilaudid [Hydromorphone Hcl] Itching     confused,   Doxycycline Rash   Enablex [Darifenacin Hydrobromide Er] Other (See Comments)    Hypotension, near syncope   Levofloxacin Other (See Comments)    Causes wrist pain   Lyrica [Pregabalin] Swelling    Swelling in legs   Methadone Hcl Other (See Comments)    Reaction unknown   Morphine And Codeine Other (See Comments)    Confusion, constipation.    Talwin [Pentazocine] Other (See Comments)    "climbing walls" anxiety    Past Medical History, Surgical history, Social history, and Family History were reviewed and updated.  Review of Systems: All other 10 point review of systems is negative.   Physical Exam:  height is 5' 5.5" (1.664 m) and weight is 167 lb 0.6 oz (75.8 kg). Her oral temperature is 98.2 F (36.8 C). Her blood pressure is 126/56 (abnormal) and her pulse is 81. Her respiration is 18 and oxygen saturation is 100%.   Wt Readings from Last 3 Encounters:  06/18/23 167 lb 0.6 oz (75.8 kg)  03/21/23 174 lb (78.9 kg)  11/21/22 174 lb 4 oz (79 kg)   S/P BKA of the right leg. In wheelchair  Ocular: Sclerae unicteric, pupils equal, round and reactive to light Ear-nose-throat: Oropharynx clear, dentition fair Lymphatic: No cervical or supraclavicular adenopathy Lungs no rales or rhonchi, good excursion  bilaterally Heart regular rate and rhythm, no murmur appreciated Abd soft, nontender, positive bowel sounds MSK no focal spinal tenderness, no joint edema Neuro: non-focal, well-oriented, appropriate affect   Lab Results  Component Value Date   WBC 2.9 (L) 06/18/2023   HGB 9.4 (L) 06/18/2023   HCT 30.6 (L) 06/18/2023   MCV 96.2 06/18/2023   PLT 116 (L) 06/18/2023   Lab Results  Component Value Date   FERRITIN 18 12/26/2022   IRON 74 12/26/2022   TIBC 300 12/26/2022   UIBC 226 12/26/2022   IRONPCTSAT 25 12/26/2022   Lab Results  Component Value Date   RETICCTPCT 1.0 06/18/2023   RBC 3.18 (L) 06/18/2023   No results found for: "KPAFRELGTCHN", "LAMBDASER", "KAPLAMBRATIO" No results found for: "IGGSERUM", "IGA", "IGMSERUM" No results found for: "TOTALPROTELP", "ALBUMINELP", "A1GS", "A2GS", "BETS", "BETA2SER", "GAMS", "MSPIKE", "SPEI"   Chemistry      Component Value Date/Time   NA 141 03/21/2023 1541   NA 140 09/03/2020 0000   K 4.3 03/21/2023 1541   CL 110 03/21/2023 1541   CO2 23 03/21/2023 1541   BUN 20 03/21/2023 1541   BUN 17 09/03/2020 0000   CREATININE 1.22 (H) 03/21/2023 1541   CREATININE 1.17 (H) 04/11/2022 1443   CREATININE 0.89 (H) 08/27/2020 1634   GLU 146 09/03/2020 0000      Component Value Date/Time   CALCIUM 8.2 (L) 03/21/2023 1541   ALKPHOS 39 04/11/2022 1443   AST 10 (L) 04/11/2022 1443   ALT 7 04/11/2022 1443   BILITOT 0.3 04/11/2022 1443       Impression and Plan: Ms. Tooker is a very pleasant 88 yo caucasian female with iron deficiency anemia secondary to GI blood loss and mild thrombocytopenia.   HGB today is 9.4 which is down a bit from 10.5 five months ago. Iron studies are pending.  Has neutropenia that waxes and wanes but usually is in the 3-4 range. Today WBC is 2.9, ANC of 1.3.  Has thrombocytopenia which waxes and wanes. Today platelet count is 116 which is down a bit from previous (135) 5 months ago.   She will start a  nutritional drink once daily plus take a multivitamin Will replenish iron if needed. Follow-up in 4 months.   Rushie Chestnut, PA-C 8/26/20243:48 PM

## 2023-06-19 ENCOUNTER — Other Ambulatory Visit: Payer: Self-pay | Admitting: Medical Oncology

## 2023-06-19 ENCOUNTER — Encounter: Payer: Self-pay | Admitting: Family

## 2023-06-19 ENCOUNTER — Telehealth: Payer: Self-pay | Admitting: Medical Oncology

## 2023-06-19 LAB — IRON AND IRON BINDING CAPACITY (CC-WL,HP ONLY)
Iron: 46 ug/dL (ref 28–170)
Saturation Ratios: 13 % (ref 10.4–31.8)
TIBC: 350 ug/dL (ref 250–450)
UIBC: 304 ug/dL (ref 148–442)

## 2023-06-19 NOTE — Telephone Encounter (Signed)
.  Contacted pt to schedule an appt. Unable to reach via phone, voicemail was left.    Follow-Up Information  Check out comments: RTC 4 months app, labs

## 2023-06-20 ENCOUNTER — Telehealth: Payer: Self-pay

## 2023-06-20 ENCOUNTER — Ambulatory Visit (INDEPENDENT_AMBULATORY_CARE_PROVIDER_SITE_OTHER): Payer: Medicare Other

## 2023-06-20 DIAGNOSIS — M810 Age-related osteoporosis without current pathological fracture: Secondary | ICD-10-CM

## 2023-06-20 MED ORDER — DENOSUMAB 60 MG/ML ~~LOC~~ SOSY
60.0000 mg | PREFILLED_SYRINGE | Freq: Once | SUBCUTANEOUS | Status: AC
Start: 2023-06-20 — End: 2023-06-20
  Administered 2023-06-20: 60 mg via SUBCUTANEOUS

## 2023-06-20 NOTE — Telephone Encounter (Signed)
Pt received injection today

## 2023-06-20 NOTE — Progress Notes (Signed)
Pt her today for Prolia injection per PCP. Injection was given SubQ on L arm, pt tolerated well.

## 2023-06-20 NOTE — Telephone Encounter (Signed)
Pt was in office today for her Prolia injection. Please schedule pt for her next injection.

## 2023-06-22 ENCOUNTER — Encounter: Payer: Self-pay | Admitting: Internal Medicine

## 2023-06-22 ENCOUNTER — Ambulatory Visit: Payer: Medicare Other | Admitting: Internal Medicine

## 2023-06-22 VITALS — BP 126/64 | HR 89 | Temp 99.0°F | Resp 18 | Ht 65.5 in | Wt 167.0 lb

## 2023-06-22 DIAGNOSIS — M810 Age-related osteoporosis without current pathological fracture: Secondary | ICD-10-CM | POA: Diagnosis not present

## 2023-06-22 DIAGNOSIS — I10 Essential (primary) hypertension: Secondary | ICD-10-CM

## 2023-06-22 DIAGNOSIS — J310 Chronic rhinitis: Secondary | ICD-10-CM

## 2023-06-22 DIAGNOSIS — R202 Paresthesia of skin: Secondary | ICD-10-CM

## 2023-06-22 DIAGNOSIS — E782 Mixed hyperlipidemia: Secondary | ICD-10-CM | POA: Diagnosis not present

## 2023-06-22 NOTE — Patient Instructions (Addendum)
Vaccines I recommend: Tdap (tetanus) RSV vaccine Flu shot this all New Covid booster this fall  For nose congestion: Get over-the-counter Flonase: 2 sprays on each side of the nose every day.   Check the  blood pressure regularly Blood pressure goal:  between 110/65 and  135/85. If it is consistently higher or lower, let me know    GO TO THE LAB : Get the blood work     GO TO THE FRONT DESK, PLEASE SCHEDULE YOUR APPOINTMENTS Come back for checkup in 4 months    "Health Care Power of attorney" ,  "Living will" (Advance care planning documents)  If you already have a living will or healthcare power of attorney, is recommended you bring the copy to be scanned in your chart.   The document will be available to all the doctors you see in the system.  Advance care planning is a process that supports adults in  understanding and sharing their preferences regarding future medical care.  The patient's preferences are recorded in documents called Advance Directives and the can be modified at any time while the patient is in full mental capacity.   If you don't have one, please consider create one.      More information at: [email or web address]

## 2023-06-22 NOTE — Progress Notes (Unsigned)
Subjective:    Patient ID: [name], female    DOB: 1935-01-15, 88 y.o.   MRN: [id number]  DOS:  06/22/2023 Type of visit - description: Follow-up, here with her son   Overall things have been stable.  Does have a couple of concerns. Notes having quite congested for a while, she tries to "blow her nose" but she is not able to get any discharge. No recent URI, no fever chills.  Also, 4 weeks ago, kind of suddenly, developed persistent paresthesia on all the fingers distally. I asked her to describe it  >> "tingling, numb, burning" Denies any neck pain.  No recent falls. Fingers no change in color.    Review of Systems See above   Past Medical History:  Diagnosis Date   Allergic rhinitis    Arthritis    "back; ankles; hands; knees" (10/31/2013)   Bilateral sensorineural hearing loss    CAD (coronary artery disease)    a. Nonobst in 2011. b. Abnormal nuc 09/2013 -> s/p cutting balloon to D2, mild LAD disease; c. 08/2015 MV: no ischemia, EF 71%.   Carcinoma in situ in a polyp 1994   a. 1994 - malignant polyp removed during colonoscopy.   Chronic diastolic CHF (congestive heart failure) (HCC)    a. 09/2013 Echo: EF 60-65%, mild LVH, Gr1 DD.   Dementia (HCC)    Diverticulosis    DJD (degenerative joint disease)    Fatty liver    GERD (gastroesophageal reflux disease)    a. Hx GERD/esophageal dysmotility followed by Dr. Juanda Chance.    Hemorrhoids    Hiatal hernia    a. s/p Nissen fundoplication 2011.   Hyperlipidemia    a. patient unwilling to use statins.   Hypertensive heart disease    IBS (irritable bowel syndrome)    Macular degeneration 03/2009   Dr. Earlene Plater   Neuropathy    a. Hands, feet, legs.   Orthostatic hypotension    Osteomyelitis (HCC)    a. 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 01/2016 s/p RLE transtibial amputation 2/2 Charcot rocker-bottom deformity and insensate neuropathy ulceration.   Osteoporosis     PE (pulmonary embolism) 12/2007   a. PE/DVT after neck surgery 2009. b. coumadin d/c 10-2008.   PONV (postoperative nausea and vomiting) 2009   neck surgery   PPD positive    Recurrent UTI    RSD (reflex sympathetic dystrophy)    a. Chronic pain.   Spinal stenosis    Type II diabetes mellitus (HCC)    no on medication   Venous insufficiency    a. Contributing to LEE.    Past Surgical History:  Procedure Laterality Date   AMPUTATION  03/06/2012   Procedure: AMPUTATION FOOT;  Surgeon: Nadara Mustard, MD;  Location: [facility] OR;  Service: Orthopedics;  Laterality: Left;  FIFTH RAY AMPUTATION    AMPUTATION Right 02/18/2016   Procedure: AMPUTATION BELOW KNEE;  Surgeon: Nadara Mustard, MD;  Location: [facility] OR;  Service: Orthopedics;  Laterality: Right;   AMPUTATION Left 11/22/2016   Procedure: Amputation 4th Toe Left Foot at Metatarsophalangeal Joint;  Surgeon: Nadara Mustard, MD;  Location: [facility] OR;  Service: Orthopedics;  Laterality: Left;   ANKLE FUSION  09/27/2012   Procedure: ANKLE FUSION;  Surgeon: Nadara Mustard, MD;  Location: [facility] OR;  Service: Orthopedics;  Laterality: Left;  Left Tibiocalcaneal Fusion   ANKLE FUSION Right 05/09/2013   Procedure: ANKLE FUSION;  Surgeon:  Nadara Mustard, MD;  Location: [facility] OR;  Service: Orthopedics;  Laterality: Right;  Excision Osteomyelitis Base 1st MT Right Foot, Fusion Medial Column   ANTERIOR CERVICAL DECOMP/DISCECTOMY FUSION  12/18/07   For OA,  Dr. Ophelia Charter:  fu by a PE   ANTERIOR CERVICAL DECOMP/DISCECTOMY FUSION N/A 11/29/2020   Procedure: Anterior Cervical Decompression Fusion - Cervical Three-Cerivcal Four;  Surgeon: Julio Sicks, MD;  Location: [facility] OR;  Service: Neurosurgery;  Laterality: N/A;  3C   CARDIAC CATHETERIZATION  05/2010    at [facility] TUNNEL RELEASE Left 09/2011   CATARACT EXTRACTION W/ INTRAOCULAR LENS  IMPLANT, BILATERAL  2004   feb 2004 left, aug 2004 right   CHOLECYSTECTOMY  03/2002   CORONARY ANGIOPLASTY  10/31/2013   CORONARY ARTERY  BYPASS GRAFT  09/27/2016   CYST REMOVAL HAND  06/2003   FOOT BONE EXCISION Right 06/2009   LAPAROSCOPIC RIGHT HEMI COLECTOMY N/A 01/08/2015   Procedure: LAPAROSCOPIC ASSISTED RIGHT HEMI COLECTOMY;  Surgeon: Luretha Murphy, MD;  Location: [facility] ORS;  Service: General;  Laterality: N/A;   LEFT HEART CATHETERIZATION WITH CORONARY ANGIOGRAM N/A 10/31/2013   Procedure: LEFT HEART CATHETERIZATION WITH CORONARY ANGIOGRAM;  Surgeon: Corky Crafts, MD;  Location: [facility] CATH LAB;  Service: Cardiovascular;  Laterality: N/A;   NASAL SEPTUM SURGERY  09/1963   NISSEN FUNDOPLICATION  01/25/2010   PERCUTANEOUS CORONARY INTERVENTION-BALLOON ONLY  10/31/2013   Procedure: PERCUTANEOUS CORONARY INTERVENTION-BALLOON ONLY;  Surgeon: Corky Crafts, MD;  Location: [facility] CATH LAB;  Service: Cardiovascular;;   ROTATOR CUFF REPAIR Right 07/2007   Dr. Gwenith Spitz CUFF REPAIR Left 11/2004   stent in left leg, behind knee  06/27/2017   x2, done at [facility] cardiology in [facility]   TOE AMPUTATION Right 08/2008   3rd toe, Dr. Lajoyce Corners due to osteomyelitis   VAGINAL HYSTERECTOMY  03/1975   VEIN LIGATION Bilateral 03/1966   VENA CAVA FILTER PLACEMENT  01/2010   green filter; "due to blood clots"   WISDOM TOOTH EXTRACTION  06/2007    Current Outpatient Medications  Medication Instructions   Alpha-Lipoic Acid 1,200 mg, Oral, Daily   amitriptyline (ELAVIL) 25 mg, Oral, Daily at bedtime   atorvastatin (LIPITOR) 40 mg, Oral, Daily   carvedilol (COREG) 12.5 mg, Oral, 2 times daily with meals   CINNAMON PO 2,000 mg, Oral, Daily   clopidogrel (PLAVIX) 75 mg, Oral, Daily at bedtime, Restart on 12/04/2020   Cranberry 200 MG CAPS    dextromethorphan-guaiFENesin (TUSSIN DM) 10-100 MG/5ML liquid Oral, Every 4 hours PRN   diclofenac Sodium (VOLTAREN) 4 g, Topical, 4 times daily   dicyclomine (BENTYL) 10 MG capsule TAKE 1 CAPSULE BY MOUTH THREE TIMES DAILY BEFORE MEALS   donepezil (ARICEPT) 10 mg, Oral, Daily at bedtime   famotidine  (PEPCID) 20 mg, Oral, 2 times daily   furosemide (LASIX) 20 mg, Oral, Daily   glucose blood (FREESTYLE LITE) test strip USE TO CHECK BLOOD SUGAR NO MORE THAN TWICE DAILY   HYDROcodone-acetaminophen (NORCO) 10-325 MG tablet 1 tablet, Oral, Every 6 hours PRN   hydrocortisone (ANUSOL-HC) 2.5 % rectal cream 1 Application, Rectal, 2 times daily, As needed   hydrocortisone (ANUSOL-HC) 25 mg, Rectal, 2 times daily, As needed   hydrocortisone 2.5 % cream APPLY TOPICALLY TO THE AFFECTED AREA TWICE DAILY   ketoconazole (NIZORAL) 2 % cream 1 Application, Topical, 2 times daily   Lancets (FREESTYLE) lancets CHECK BLOOD SUGAR TWICE DAILY   linaclotide (LINZESS) 72 mcg,  Oral, Daily before breakfast   loperamide (IMODIUM A-D) 2 mg, Oral, As needed   MegaRed Omega-3 Krill Oil 500 MG CAPS Restart on 12/04/2020   methenamine (HIPREX) 1 g tablet 0.5 tablets, Oral, Daily   Multiple Vitamins-Minerals (PRESERVISION AREDS 2) CAPS 1 capsule, Oral, 2 times daily   naloxone (NARCAN) nasal spray 4 mg/0.1 mL 1 spray, Nasal, Once PRN   nitroGLYCERIN (NITROSTAT) 0.4 mg, Sublingual, Every 5 min x3 PRN   omeprazole (PRILOSEC) 20 MG capsule TAKE 1 CAPSULE TWICE A DAY BEFORE MEALS   ondansetron (ZOFRAN) 4 mg, Oral, Every 8 hours PRN   OVER THE COUNTER MEDICATION 1 tablet, 3 times daily, Berberorine Gluco Defense    Polyvinyl Alcohol-Povidone (REFRESH OP) 1 drop, Both Eyes, 3 times daily PRN   pramipexole (MIRAPEX) 0.125-0.25 mg, Oral, At bedtime PRN   Prenatal Vit-Fe Fumarate-FA (PRENATAL MULTIVITAMIN) TABS tablet 1 tablet, Oral, Daily   Probiotic Product (PROBIOTIC PEARLS PO) 1 tablet, Oral, Daily   rivaroxaban (XARELTO) 2.5 mg, Oral, 2 times daily, Restart on 12/04/2020   silver sulfADIAZINE (SILVADENE) 1 % cream 1 application , Topical, Daily   Vitamin B-12 5,000 mcg, Sublingual, Daily   vitamin C with rose hips 500 mg, Oral, 2 times daily   Vitamin D3 5,000 Units, Oral, Every evening, Reported on 11/24/2015   vitamin E  400 Units, Oral, Daily   Zinc Sulfate 66 MG TABS        Objective:   Physical Exam BP 126/64   Pulse 89   Temp 99 F (37.2 C) (Oral)   Resp 18   Ht 5' 5.5" (1.664 m)   Wt 167 lb (75.8 kg)   SpO2 98%   BMI 27.37 kg/m  General:   Well developed, NAD, BMI noted. HEENT:  Normocephalic . Face symmetric, atraumatic Lungs:  CTA B Normal respiratory effort, no intercostal retractions, no accessory muscle use. Heart: RRR,  no murmur.  Upper extremities: Normal to inspection and palpation Skin: Not pale. Not jaundice Neurologic:  alert & oriented X3.  Speech normal, gait not tested Psych--  Cognition and judgment appear intact.  Cooperative with normal attention span and concentration.  Behavior appropriate. No anxious or depressed appearing.      Assessment     ASSESSMENT DM, Neuropathy, amputations due to Charcot feet. - metformin intolerant  (lethargic) HTN  Hyperlipidemia LUNG MASS -incidental per CT 2009,  PET scan 01-2008: likely  benign, CT 08-2009 no change, no further CTs per Dr Shelle Iron - Had a CT in [place] 09-2016 "spiculated mass", saw Dr Delton Coombes,  CT  09/2017 stable CV: ---CAD cardiologist in [facility] --NSTEMI 09-2016. Outside hospital: Cath 2 vessel disease, CABG 2, LIMA to LAD and Diag   ---Chronic diastolic CHF ---PVD: Multiple procedures per Dr Docia Barrier, [facility]) ---May 2023: Cardiology rec continue Plavix and Xarelto, stop aspirin. ---  IVC filter seen on CXR  09/23/2020 GI: GERD, IBS, colon polyps, PUD, abdominal pain "post polypectomy syndrome" naueas meds prn (antivert-zofran, gets nausea when car traveling) Osteoporosis  MSK,pain mngmt : --Reflex sympathetic dystrophy --Neuropathy --DJD --Spinal stenosis  --H/o Osteomyelitis  Foot --s/p amputation:  4th 5th L toes , R BKA  (01-2016, dx osteomyelitis) w/ phantom pain  -- sees Dr Lajoyce Corners prn She took oxycodone after surgery and that did NOT seem to help better than hydrocodone. Intolerant to  Cymbalta, Neurontin ; Lyrica helped but caused edema. Recurrent UTIs: Dr Mena Goes  Venous insufficiency Dementia: MMSE 23 (January 2023) H/o  + PPD H/o hypothyroidism:  TSHs stable H/o dyspnea   PLAN:     DM: Diet controlled.  Recheck A1c on RTC HTN: BP controlled, no change.  On carvedilol Lasix.  Check a BMP. Hyperlipidemia  On atorvastatin 40 mg.  Last FLP satisfactory.  Check LFTs. Cardiovascular: Per cardiology and vascular surgery.  She is seen regularly. Paresthesias, hands: New onset, 4 weeks ago, etiology unclear, referred to neurology.  Check a TSH B12 and folic acid Dx: Nasal congestion for the last few weeks, recommend Flonase consistently before further evaluation is undertaken.  Preventive care reviewed: Vaccines I recommend: Flu shot, COVID booster, RSV, Tdap.  No further cancer screenings per guidelines. Healthcare POA: Information provided RTC 4 months

## 2023-06-23 LAB — TSH: TSH: 1.44 m[IU]/L (ref 0.40–4.50)

## 2023-06-23 LAB — BASIC METABOLIC PANEL
BUN/Creatinine Ratio: 20 (calc) (ref 6–22)
BUN: 22 mg/dL (ref 7–25)
CO2: 24 mmol/L (ref 20–32)
Calcium: 8.5 mg/dL — ABNORMAL LOW (ref 8.6–10.4)
Chloride: 111 mmol/L — ABNORMAL HIGH (ref 98–110)
Creat: 1.12 mg/dL — ABNORMAL HIGH (ref 0.60–0.95)
Glucose, Bld: 87 mg/dL (ref 65–99)
Potassium: 4.4 mmol/L (ref 3.5–5.3)
Sodium: 143 mmol/L (ref 135–146)

## 2023-06-23 LAB — ALT: ALT: 13 U/L (ref 6–29)

## 2023-06-23 LAB — AST: AST: 18 U/L (ref 10–35)

## 2023-06-23 LAB — B12 AND FOLATE PANEL
Folate: >24 ng/mL
Vitamin B-12: 543 pg/mL (ref 200–1100)

## 2023-06-23 LAB — VITAMIN D 25 HYDROXY (VIT D DEFICIENCY, FRACTURES): Vit D, 25-Hydroxy: 81 ng/mL (ref 30–100)

## 2023-06-23 NOTE — Assessment & Plan Note (Signed)
Preventive care reviewed: Vaccines I recommend: Flu shot, COVID booster, RSV, Tdap. No further cancer screenings per guidelines. Healthcare POA: Information provided

## 2023-06-23 NOTE — Assessment & Plan Note (Signed)
DM: Diet controlled.  Recheck A1c on RTC HTN: BP controlled, no change.  On carvedilol Lasix.  Check a BMP. Hyperlipidemia  On atorvastatin 40 mg.  Last FLP satisfactory.  Check LFTs. Cardiovascular: Per cardiology and vascular surgery.  She is seen regularly. Paresthesias, hands: New onset, 4 weeks ago, etiology unclear, referred to neurology.  Check a TSH B12 and folic acid Rhinitis: Nasal congestion for the last few weeks, recommend Flonase consistently before further evaluation is undertaken. Preventive care reviewed: Vaccines I recommend: Flu shot, COVID booster, RSV, Tdap. No further cancer screenings per guidelines. Healthcare POA: Information provided RTC 4 months

## 2023-06-27 ENCOUNTER — Encounter: Payer: Self-pay | Admitting: Family

## 2023-06-28 ENCOUNTER — Inpatient Hospital Stay: Payer: Medicare Other | Attending: Family

## 2023-06-28 ENCOUNTER — Telehealth: Payer: Self-pay | Admitting: Internal Medicine

## 2023-06-28 ENCOUNTER — Ambulatory Visit (INDEPENDENT_AMBULATORY_CARE_PROVIDER_SITE_OTHER): Payer: Medicare Other | Admitting: *Deleted

## 2023-06-28 VITALS — BP 153/70 | HR 70 | Temp 97.8°F | Resp 17

## 2023-06-28 DIAGNOSIS — D500 Iron deficiency anemia secondary to blood loss (chronic): Secondary | ICD-10-CM | POA: Diagnosis not present

## 2023-06-28 DIAGNOSIS — K922 Gastrointestinal hemorrhage, unspecified: Secondary | ICD-10-CM | POA: Diagnosis not present

## 2023-06-28 DIAGNOSIS — D509 Iron deficiency anemia, unspecified: Secondary | ICD-10-CM

## 2023-06-28 DIAGNOSIS — Z0000 Encounter for general adult medical examination without abnormal findings: Secondary | ICD-10-CM

## 2023-06-28 MED ORDER — SODIUM CHLORIDE 0.9 % IV SOLN: INTRAVENOUS | Status: DC

## 2023-06-28 MED ORDER — SODIUM CHLORIDE 0.9 % IV SOLN
1000.0000 mg | Freq: Once | INTRAVENOUS | Status: AC
  Administered 2023-06-28: 1000 mg via INTRAVENOUS
  Filled 2023-06-28: qty 10

## 2023-06-28 NOTE — Progress Notes (Signed)
Subjective:   [name] is a 88 y.o. female who presents for Medicare Annual (Subsequent) preventive examination.  Visit Complete: Virtual  I connected with  [name] on 06/28/23 by a audio enabled telemedicine application and verified that I am speaking with the correct person using two identifiers.  Patient Location: Home  Provider Location: Office/Clinic  I discussed the limitations of evaluation and management by telemedicine. The patient expressed understanding and agreed to proceed.   Review of Systems     Cardiac Risk Factors include: advanced age (>63men, >3 women);dyslipidemia;hypertension;diabetes mellitus     Objective:    Vital Signs: Unable to obtain new vitals due to this being a telehealth visit.      06/28/2023    3:50 PM 06/18/2023    3:36 PM 12/26/2022    1:54 PM 08/21/2022    3:04 PM 05/15/2022    2:22 PM 04/11/2022    2:57 PM 04/03/2022    1:35 PM  Advanced Directives  Does Patient Have a Medical Advance Directive? No No No No No No No  Would patient like information on creating a medical advance directive? No - Patient declined No - Patient declined  No - Patient declined No - Patient declined No - Patient declined     Current Medications (verified) Outpatient Encounter Medications as of 06/28/2023  Medication Sig   Alpha-Lipoic Acid 600 MG CAPS Take 1,200 mg by mouth daily.   amitriptyline (ELAVIL) 25 MG tablet Take 1 tablet (25 mg total) by mouth at bedtime.   Ascorbic Acid (VITAMIN C WITH ROSE HIPS) 500 MG tablet Take 500 mg by mouth in the morning and at bedtime.   atorvastatin (LIPITOR) 40 MG tablet Take 40 mg by mouth daily.   carvedilol (COREG) 12.5 MG tablet Take 12.5 mg by mouth 2 (two) times daily with a meal.   Cholecalciferol (VITAMIN D3) 5000 UNITS CAPS Take 5,000 Units by mouth every evening. Reported on 11/24/2015   CINNAMON PO Take 2,000 mg by mouth daily.   clopidogrel (PLAVIX) 75 MG tablet Take 1 tablet (75 mg total) by mouth at  bedtime. Restart on 12/04/2020   Cranberry 200 MG CAPS    Cyanocobalamin (VITAMIN B-12) 5000 MCG SUBL Place 5,000 mcg under the tongue daily.   dextromethorphan-guaiFENesin (TUSSIN DM) 10-100 MG/5ML liquid Take by mouth every 4 (four) hours as needed for cough.   diclofenac Sodium (VOLTAREN) 1 % GEL Apply 4 g topically 4 (four) times daily. (Patient taking differently: Apply 4 g topically 4 (four) times daily. If needed for pain)   dicyclomine (BENTYL) 10 MG capsule TAKE 1 CAPSULE BY MOUTH THREE TIMES DAILY BEFORE MEALS   donepezil (ARICEPT) 10 MG tablet TAKE 1 TABLET AT BEDTIME   famotidine (PEPCID) 20 MG tablet Take 1 tablet (20 mg total) by mouth 2 (two) times daily.   furosemide (LASIX) 20 MG tablet Take 20 mg by mouth daily.   glucose blood (FREESTYLE LITE) test strip USE TO CHECK BLOOD SUGAR NO MORE THAN TWICE DAILY   HYDROcodone-acetaminophen (NORCO) 10-325 MG tablet Take 1 tablet by mouth every 6 (six) hours as needed.   hydrocortisone (ANUSOL-HC) 2.5 % rectal cream Place 1 Application rectally 2 (two) times daily. As needed   hydrocortisone (ANUSOL-HC) 25 MG suppository Place 1 suppository (25 mg total) rectally 2 (two) times daily. As needed   hydrocortisone 2.5 % cream APPLY TOPICALLY TO THE AFFECTED AREA TWICE DAILY   ketoconazole (NIZORAL) 2 % cream Apply 1 Application topically  2 (two) times daily.   Lancets (FREESTYLE) lancets CHECK BLOOD SUGAR TWICE DAILY   linaclotide (LINZESS) 72 MCG capsule Take 1 capsule (72 mcg total) by mouth daily before breakfast.   loperamide (IMODIUM A-D) 2 MG tablet Take 2 mg by mouth as needed for diarrhea or loose stools.   MegaRed Omega-3 Krill Oil 500 MG CAPS Restart on 12/04/2020   methenamine (HIPREX) 1 g tablet Take 0.5 tablets by mouth daily.   Multiple Vitamins-Minerals (PRESERVISION AREDS 2) CAPS Take 1 capsule by mouth 2 (two) times daily.   naloxone (NARCAN) nasal spray 4 mg/0.1 mL Place 1 spray into the nose once as needed for up to 1 dose.  (Patient not taking: Reported on 06/22/2023)   nitroGLYCERIN (NITROSTAT) 0.4 MG SL tablet Place 1 tablet (0.4 mg total) under the tongue every 5 (five) minutes x 3 doses as needed for chest pain. (Patient not taking: Reported on 06/22/2023)   omeprazole (PRILOSEC) 20 MG capsule TAKE 1 CAPSULE TWICE A DAY BEFORE MEALS   ondansetron (ZOFRAN) 4 MG tablet Take 1 tablet (4 mg total) by mouth every 8 (eight) hours as needed for nausea or vomiting.   OVER THE COUNTER MEDICATION 1 tablet 3 (three) times daily. Berberorine Gluco Defense   Polyvinyl Alcohol-Povidone (REFRESH OP) Place 1 drop into both eyes 3 (three) times daily as needed (dry eyes).    pramipexole (MIRAPEX) 0.125 MG tablet Take 1-2 tablets (0.125-0.25 mg total) by mouth at bedtime as needed.   Prenatal Vit-Fe Fumarate-FA (PRENATAL MULTIVITAMIN) TABS tablet Take 1 tablet by mouth daily at 12 noon.   Probiotic Product (PROBIOTIC PEARLS PO) Take 1 tablet by mouth daily.   rivaroxaban (XARELTO) 2.5 MG TABS tablet Take 1 tablet (2.5 mg total) by mouth 2 (two) times daily. Restart on 12/04/2020   silver sulfADIAZINE (SILVADENE) 1 % cream Apply 1 application topically daily. (Patient not taking: Reported on 06/22/2023)   vitamin E 180 MG (400 UNITS) capsule Take 400 Units by mouth daily.   Zinc Sulfate 66 MG TABS    [DISCONTINUED] 0.9 %  sodium chloride infusion    No facility-administered encounter medications on file as of 06/28/2023.    Allergies (verified) Metformin and related, Cymbalta [duloxetine hcl], Gabapentin, Silicone, Tramadol, Adhesive [tape], Cefazolin, Ciprofloxacin, Clorazepate dipotassium, Dilaudid [hydromorphone hcl], Doxycycline, Enablex [darifenacin hydrobromide er], Levofloxacin, Lyrica [pregabalin], Methadone hcl, Morphine and codeine, and Talwin [pentazocine]   History: Past Medical History:  Diagnosis Date   Allergic rhinitis    Arthritis    "back; ankles; hands; knees" (10/31/2013)   Bilateral sensorineural hearing loss     CAD (coronary artery disease)    a. Nonobst in 2011. b. Abnormal nuc 09/2013 -> s/p cutting balloon to D2, mild LAD disease; c. 08/2015 MV: no ischemia, EF 71%.   Carcinoma in situ in a polyp 1994   a. 1994 - malignant polyp removed during colonoscopy.   Chronic diastolic CHF (congestive heart failure) (HCC)    a. 09/2013 Echo: EF 60-65%, mild LVH, Gr1 DD.   Dementia (HCC)    Diverticulosis    DJD (degenerative joint disease)    Fatty liver    GERD (gastroesophageal reflux disease)    a. Hx GERD/esophageal dysmotility followed by Dr. Juanda Chance.    Hemorrhoids    Hiatal hernia    a. s/p Nissen fundoplication 2011.   Hyperlipidemia    a. patient unwilling to use statins.   Hypertensive heart disease    IBS (irritable bowel syndrome)    Macular degeneration  03/2009   Dr. Earlene Plater   Neuropathy    a. Hands, feet, legs.   Orthostatic hypotension    Osteomyelitis (HCC)    a. 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 01/2016 s/p RLE transtibial amputation 2/2 Charcot rocker-bottom deformity and insensate neuropathy ulceration.   Osteoporosis    PE (pulmonary embolism) 12/2007   a. PE/DVT after neck surgery 2009. b. coumadin d/c 10-2008.   PONV (postoperative nausea and vomiting) 2009   neck surgery   PPD positive    Recurrent UTI    RSD (reflex sympathetic dystrophy)    a. Chronic pain.   Spinal stenosis    Type II diabetes mellitus (HCC)    no on medication   Venous insufficiency    a. Contributing to LEE.   Past Surgical History:  Procedure Laterality Date   AMPUTATION  03/06/2012   Procedure: AMPUTATION FOOT;  Surgeon: Nadara Mustard, MD;  Location: [facility] OR;  Service: Orthopedics;  Laterality: Left;  FIFTH RAY AMPUTATION    AMPUTATION Right 02/18/2016   Procedure: AMPUTATION BELOW KNEE;  Surgeon: Nadara Mustard, MD;  Location: [facility] OR;  Service: Orthopedics;  Laterality: Right;   AMPUTATION Left 11/22/2016   Procedure: Amputation 4th  Toe Left Foot at Metatarsophalangeal Joint;  Surgeon: Nadara Mustard, MD;  Location: [facility] OR;  Service: Orthopedics;  Laterality: Left;   ANKLE FUSION  09/27/2012   Procedure: ANKLE FUSION;  Surgeon: Nadara Mustard, MD;  Location: [facility] OR;  Service: Orthopedics;  Laterality: Left;  Left Tibiocalcaneal Fusion   ANKLE FUSION Right 05/09/2013   Procedure: ANKLE FUSION;  Surgeon: Nadara Mustard, MD;  Location: [facility] OR;  Service: Orthopedics;  Laterality: Right;  Excision Osteomyelitis Base 1st MT Right Foot, Fusion Medial Column   ANTERIOR CERVICAL DECOMP/DISCECTOMY FUSION  12/18/07   For OA,  Dr. Ophelia Charter:  fu by a PE   ANTERIOR CERVICAL DECOMP/DISCECTOMY FUSION N/A 11/29/2020   Procedure: Anterior Cervical Decompression Fusion - Cervical Three-Cerivcal Four;  Surgeon: Julio Sicks, MD;  Location: [facility] OR;  Service: Neurosurgery;  Laterality: N/A;  3C   CARDIAC CATHETERIZATION  05/2010    at [facility] TUNNEL RELEASE Left 09/2011   CATARACT EXTRACTION W/ INTRAOCULAR LENS  IMPLANT, BILATERAL  2004   feb 2004 left, aug 2004 right   CHOLECYSTECTOMY  03/2002   CORONARY ANGIOPLASTY  10/31/2013   CORONARY ARTERY BYPASS GRAFT  09/27/2016   CYST REMOVAL HAND  06/2003   FOOT BONE EXCISION Right 06/2009   LAPAROSCOPIC RIGHT HEMI COLECTOMY N/A 01/08/2015   Procedure: LAPAROSCOPIC ASSISTED RIGHT HEMI COLECTOMY;  Surgeon: Luretha Murphy, MD;  Location: [facility] ORS;  Service: General;  Laterality: N/A;   LEFT HEART CATHETERIZATION WITH CORONARY ANGIOGRAM N/A 10/31/2013   Procedure: LEFT HEART CATHETERIZATION WITH CORONARY ANGIOGRAM;  Surgeon: Corky Crafts, MD;  Location: [facility] CATH LAB;  Service: Cardiovascular;  Laterality: N/A;   NASAL SEPTUM SURGERY  09/1963   NISSEN FUNDOPLICATION  01/25/2010   PERCUTANEOUS CORONARY INTERVENTION-BALLOON ONLY  10/31/2013   Procedure: PERCUTANEOUS CORONARY INTERVENTION-BALLOON ONLY;  Surgeon: Corky Crafts, MD;  Location: [facility] CATH LAB;  Service: Cardiovascular;;   ROTATOR CUFF REPAIR Right  07/2007   Dr. Gwenith Spitz CUFF REPAIR Left 11/2004   stent in left leg, behind knee  06/27/2017   x2, done at [facility] cardiology in [facility]   TOE AMPUTATION Right 08/2008   3rd toe, Dr. Lajoyce Corners due  to osteomyelitis   VAGINAL HYSTERECTOMY  03/1975   VEIN LIGATION Bilateral 03/1966   VENA CAVA FILTER PLACEMENT  01/2010   green filter; "due to blood clots"   WISDOM TOOTH EXTRACTION  06/2007   Family History  Problem Relation Age of Onset   Heart disease Mother        mitral valve replaced   Arthritis Mother    Diabetic kidney disease Daughter    Diabetes Father    Stroke Father    Migraines Sister    Hypertension Other    Breast cancer Other    Colon cancer Neg Hx    Esophageal cancer Neg Hx    Stomach cancer Neg Hx    Rectal cancer Neg Hx    Social History   Socioeconomic History   Marital status: Divorced    Spouse name: Not on file   Number of children: 3   Years of education: 15   Highest education level: Some college, no degree  Occupational History   Occupation: Retired     Comment: from social services   Tobacco Use   Smoking status: Never   Smokeless tobacco: Never   Tobacco comments:    tried for a few months in college.   Vaping Use   Vaping status: Never Used  Substance and Sexual Activity   Alcohol use: No    Alcohol/week: 0.0 standard drinks of alcohol   Drug use: No   Sexual activity: Not Currently    Birth control/protection: Post-menopausal  Other Topics Concern   Not on file  Social History Narrative   Daughter Angelique Blonder deceased Sep 01, 2020   daughter lives in town   1 son in [place], Tinnie Gens   Caffeine use:  Tea 1/day w/ dinner   Coffee occass    Currently lives in [place] with son Leshaun Etienne.   Social Determinants of Health   Financial Resource Strain: Low Risk  (03/20/2023)   Overall Financial Resource Strain (CARDIA)    Difficulty of Paying Living Expenses: Not hard at all  Food Insecurity: No Food Insecurity (03/20/2023)   Hunger Vital  Sign    Worried About Running Out of Food in the Last Year: Never true    Ran Out of Food in the Last Year: Never true  Transportation Needs: No Transportation Needs (03/20/2023)   PRAPARE - Administrator, Civil Service (Medical): No    Lack of Transportation (Non-Medical): No  Physical Activity: Inactive (06/28/2023)   Exercise Vital Sign    Days of Exercise per Week: 0 days    Minutes of Exercise per Session: 0 min  Stress: Patient Declined (03/20/2023)   [employer] of Occupational Health - Occupational Stress Questionnaire    Feeling of Stress : Patient declined  Social Connections: Socially Isolated (03/20/2023)   Social Connection and Isolation Panel [NHANES]    Frequency of Communication with Friends and Family: More than three times a week    Frequency of Social Gatherings with Friends and Family: More than three times a week    Attends Religious Services: Never    Database administrator or Organizations: No    Attends Engineer, structural: Not on file    Marital Status: Divorced    Tobacco Counseling Counseling given: Not Answered Tobacco comments: tried for a few months in college.    Clinical Intake:  Pre-visit preparation completed: Yes  Pain : No/denies pain  BMI - recorded: 27.37 Nutritional Status: BMI 25 -29 Overweight Nutritional Risks:  None Diabetes: No  How often do you need to have someone help you when you read instructions, pamphlets, or other written materials from your doctor or pharmacy?: 1 - Never  Interpreter Needed?: No  Information entered by :: Donne Anon, CMA   Activities of Daily Living    06/28/2023    3:45 PM  In your present state of health, do you have any difficulty performing the following activities:  Hearing? 1  Comment some slight hearing loss  Vision? 0  Difficulty concentrating or making decisions? 1  Comment decision making  Walking or climbing stairs? 1  Dressing or bathing? 0  Doing errands,  shopping? 1  Comment son Insurance claims handler and eating ? Y  Comment son prepares meals  Using the Toilet? N  In the past six months, have you accidently leaked urine? Y  Do you have problems with loss of bowel control? Y  Managing your Medications? Y  Comment son assists  Managing your Finances? Y  Comment son assists  Housekeeping or managing your Housekeeping? Y    Patient Care Team: Wanda Plump, MD as PCP - General Anson Fret, MD as Consulting Physician (Neurology) Nadara Mustard, MD as Consulting Physician (Orthopedic Surgery) Louisa Second, MD as Consulting Physician (Vascular Surgery) Cleone Slim, MD as Consulting Physician (Cardiology) Jerilee Field, MD as Consulting Physician (Urology) Antony Contras, MD as Consulting Physician (Ophthalmology)  Indicate any recent Medical Services you may have received from other than [facility] providers in the past year (date may be approximate).     Assessment:   This is a routine wellness examination for Teala.  Hearing/Vision screen No results found.   Goals Addressed   None    Depression Screen    06/28/2023    4:21 PM 06/22/2023    3:08 PM 03/21/2023    2:47 PM 11/21/2022    1:43 PM 08/04/2022    3:58 PM 04/03/2022    1:39 PM 02/27/2022    1:52 PM  PHQ 2/9 Scores  PHQ - 2 Score 1 1 0 0 0 0 0  PHQ- 9 Score 9 9   0  0    Fall Risk    06/28/2023    3:49 PM 06/22/2023    2:27 PM 03/21/2023    2:47 PM 11/21/2022    1:43 PM 08/04/2022    3:57 PM  Fall Risk   Falls in the past year? 1 1 1  0 1  Number falls in past yr: 1 1 0 0 0  Injury with Fall? 0 0 0 0 0  Risk for fall due to : Impaired balance/gait   History of fall(s);Impaired mobility   Follow up Falls evaluation completed Falls evaluation completed;Education provided Falls evaluation completed Falls evaluation completed;Education provided Falls evaluation completed    MEDICARE RISK AT HOME: Medicare Risk at Home Any stairs in or around the home?: Yes If  so, are there any without handrails?: No Home free of loose throw rugs in walkways, pet beds, electrical cords, etc?: Yes Adequate lighting in your home to reduce risk of falls?: Yes Life alert?: No Use of a cane, walker or w/c?: Yes Grab bars in the bathroom?: Yes Shower chair or bench in shower?: No Elevated toilet seat or a handicapped toilet?: No  TIMED UP AND GO:  Was the test performed?  No    Cognitive Function:    10/28/2021    3:43 PM 12/14/2016   12:27 PM  MMSE - Mini  Mental State Exam  Orientation to time 4 5  Orientation to Place 5 5  Registration 3 3  Attention/ Calculation 2 5  Recall 1 2  Language- name 2 objects 1 2  Language- repeat 1 1  Language- follow 3 step command 3 3  Language- read & follow direction 1 1  Write a sentence 1 1  Copy design 1 1  Total score 23 29        06/28/2023    3:52 PM 02/24/2021    4:02 PM  6CIT Screen  What Year? 4 points 0 points  What month? 0 points 3 points  What time? 3 points 0 points  Count back from 20 0 points 0 points  Months in reverse 4 points 2 points  Repeat phrase 8 points 8 points  Total Score 19 points 13 points    Immunizations Immunization History  Administered Date(s) Administered   Fluad Quad(high Dose 65+) 07/11/2019, 09/22/2020, 07/27/2021, 07/03/2022   Influenza Split 08/07/2012   Influenza Whole 07/22/2008, 07/26/2009   Influenza, High Dose Seasonal PF 10/15/2015, 09/18/2016, 07/23/2017, 08/21/2018   Influenza, Seasonal, Injecte, Preservative Fre 07/23/2013   Influenza,inj,Quad PF,6+ Mos 10/06/2014   PFIZER(Purple Top)SARS-COV-2 Vaccination 12/27/2019, 01/31/2020, 11/01/2020   PNEUMOCOCCAL CONJUGATE-20 07/03/2022   Pneumococcal Conjugate-13 10/06/2014   Pneumococcal Polysaccharide-23 09/28/2010   Tetanus 09/17/2013   Zoster Recombinant(Shingrix) 10/11/2018, 12/12/2018   Zoster, Live 09/18/2011    TDAP status: Due, Education has been provided regarding the importance of this vaccine.  Advised may receive this vaccine at local pharmacy or Health Dept. Aware to provide a copy of the vaccination record if obtained from local pharmacy or Health Dept. Verbalized acceptance and understanding.  Flu Vaccine status: Due, Education has been provided regarding the importance of this vaccine. Advised may receive this vaccine at local pharmacy or Health Dept. Aware to provide a copy of the vaccination record if obtained from local pharmacy or Health Dept. Verbalized acceptance and understanding.  Pneumococcal vaccine status: Up to date  Covid-19 vaccine status: Information provided on how to obtain vaccines.   Qualifies for Shingles Vaccine? Yes   Zostavax completed Yes   Shingrix Completed?: Yes  Screening Tests Health Maintenance  Topic Date Due   DTaP/Tdap/Td (1 - Tdap) 09/18/2013   Medicare Annual Wellness (AWV)  04/04/2023   COVID-19 Vaccine (4 - 2023-24 season) 06/24/2023   INFLUENZA VACCINE  01/21/2024 (Originally 05/24/2023)   HEMOGLOBIN A1C  09/21/2023   OPHTHALMOLOGY EXAM  04/15/2024   Pneumonia Vaccine 15+ Years old  Completed   DEXA SCAN  Completed   Zoster Vaccines- Shingrix  Completed   HPV VACCINES  Aged Out   Colonoscopy  Discontinued    Health Maintenance  Health Maintenance Due  Topic Date Due   DTaP/Tdap/Td (1 - Tdap) 09/18/2013   Medicare Annual Wellness (AWV)  04/04/2023   COVID-19 Vaccine (4 - 2023-24 season) 06/24/2023    Colorectal cancer screening: No longer required.   Mammogram status: Completed 03/16/23. Repeat every year  Bone Density status: Completed 03/16/23. Results reflect: Bone density results: OSTEOPOROSIS. Repeat every 2 years.  Lung Cancer Screening: (Low Dose CT Chest recommended if Age 34-80 years, 20 pack-year currently smoking OR have quit w/in 15years.) does not qualify.   Additional Screening:  Hepatitis C Screening: does not qualify  Vision Screening: Recommended annual ophthalmology exams for early detection of glaucoma  and other disorders of the eye. Is the patient up to date with their annual eye exam?  Yes  Who is  the provider or what is the name of the office in which the patient attends annual eye exams? [facility]. If pt is not established with a provider, would they like to be referred to a provider to establish care? No .   Dental Screening: Recommended annual dental exams for proper oral hygiene  Diabetic Foot Exam: N/a  Community Resource Referral / Chronic Care Management: CRR required this visit?  No   CCM required this visit?  No     Plan:     I have personally reviewed and noted the following in the patient's chart:   Medical and social history Use of alcohol, tobacco or illicit drugs  Current medications and supplements including opioid prescriptions. Patient is currently taking opioid prescriptions. Information provided to patient regarding non-opioid alternatives. Patient advised to discuss non-opioid treatment plan with their provider. Functional ability and status Nutritional status Physical activity Advanced directives List of other physicians Hospitalizations, surgeries, and ER visits in previous 12 months Vitals Screenings to include cognitive, depression, and falls Referrals and appointments  In addition, I have reviewed and discussed with patient certain preventive protocols, quality metrics, and best practice recommendations. A written personalized care plan for preventive services as well as general preventive health recommendations were provided to patient.     Donne Anon, CMA   06/28/2023   After Visit Summary: (MyChart) Due to this being a telephonic visit, the after visit summary with patients personalized plan was offered to patient via MyChart   Nurse Notes: None

## 2023-06-28 NOTE — Telephone Encounter (Signed)
Pt's son dropped off document to be filled out by provider (Parking Placard Disability form 1- page) Pt would like to have document to be mailed to pt's address [address], [place], [place] [address]. Document put at front office tray under providers name.

## 2023-06-28 NOTE — Telephone Encounter (Signed)
Form completed, placed in PCP red folder to be signed.  

## 2023-06-28 NOTE — Patient Instructions (Signed)
[name] , Thank you for taking time to come for your Medicare Wellness Visit. I appreciate your ongoing commitment to your health goals. Please review the following plan we discussed and let me know if I can assist you in the future.   These are the goals we discussed:  Goals      Increase mobility     Start walking again.        This is a list of the screening recommended for you and due dates:  Health Maintenance  Topic Date Due   DTaP/Tdap/Td vaccine (1 - Tdap) 09/18/2013   COVID-19 Vaccine (4 - 2023-24 season) 06/24/2023   Flu Shot  01/21/2024*   Hemoglobin A1C  09/21/2023   Eye exam for diabetics  04/15/2024   Medicare Annual Wellness Visit  06/27/2024   Pneumonia Vaccine  Completed   DEXA scan (bone density measurement)  Completed   Zoster (Shingles) Vaccine  Completed   HPV Vaccine  Aged Out   Colon Cancer Screening  Discontinued  *Topic was postponed. The date shown is not the original due date.     Next appointment: Follow up in one year for your annual wellness visit.   Preventive Care 86 Years and Older, Female Preventive care refers to lifestyle choices and visits with your health care provider that can promote health and wellness. What does preventive care include? A yearly physical exam. This is also called an annual well check. Dental exams once or twice a year. Routine eye exams. Ask your health care provider how often you should have your eyes checked. Personal lifestyle choices, including: Daily care of your teeth and gums. Regular physical activity. Eating a healthy diet. Avoiding tobacco and drug use. Limiting alcohol use. Practicing safe sex. Taking low-dose aspirin every day. Taking vitamin and mineral supplements as recommended by your health care provider. What happens during an annual well check? The services and screenings done by your health care provider during your annual well check will depend on your age, overall health, lifestyle  risk factors, and family history of disease. Counseling  Your health care provider may ask you questions about your: Alcohol use. Tobacco use. Drug use. Emotional well-being. Home and relationship well-being. Sexual activity. Eating habits. History of falls. Memory and ability to understand (cognition). Work and work Astronomer. Reproductive health. Screening  You may have the following tests or measurements: Height, weight, and BMI. Blood pressure. Lipid and cholesterol levels. These may be checked every 5 years, or more frequently if you are over 72 years old. Skin check. Lung cancer screening. You may have this screening every year starting at age 86 if you have a 30-pack-year history of smoking and currently smoke or have quit within the past 15 years. Fecal occult blood test (FOBT) of the stool. You may have this test every year starting at age 86. Flexible sigmoidoscopy or colonoscopy. You may have a sigmoidoscopy every 5 years or a colonoscopy every 10 years starting at age 86. Hepatitis C blood test. Hepatitis B blood test. Sexually transmitted disease (STD) testing. Diabetes screening. This is done by checking your blood sugar (glucose) after you have not eaten for a while (fasting). You may have this done every 1-3 years. Bone density scan. This is done to screen for osteoporosis. You may have this done starting at age 86. Mammogram. This may be done every 1-2 years. Talk to your health care provider about how often you should have regular mammograms. Talk with your health care provider  about your test results, treatment options, and if necessary, the need for more tests. Vaccines  Your health care provider may recommend certain vaccines, such as: Influenza vaccine. This is recommended every year. Tetanus, diphtheria, and acellular pertussis (Tdap, Td) vaccine. You may need a Td booster every 10 years. Zoster vaccine. You may need this after age 86. Pneumococcal  13-valent conjugate (PCV13) vaccine. One dose is recommended after age 86. Pneumococcal polysaccharide (PPSV23) vaccine. One dose is recommended after age 86. Talk to your health care provider about which screenings and vaccines you need and how often you need them. This information is not intended to replace advice given to you by your health care provider. Make sure you discuss any questions you have with your health care provider. Document Released: 11/05/2015 Document Revised: 06/28/2016 Document Reviewed: 08/10/2015 Elsevier Interactive Patient Education  2017 [employer].  Fall Prevention in the Home Falls can cause injuries. They can happen to people of all ages. There are many things you can do to make your home safe and to help prevent falls. What can I do on the outside of my home? Regularly fix the edges of walkways and driveways and fix any cracks. Remove anything that might make you trip as you walk through a door, such as a raised step or threshold. Trim any bushes or trees on the path to your home. Use bright outdoor lighting. Clear any walking paths of anything that might make someone trip, such as rocks or tools. Regularly check to see if handrails are loose or broken. Make sure that both sides of any steps have handrails. Any raised decks and porches should have guardrails on the edges. Have any leaves, snow, or ice cleared regularly. Use sand or salt on walking paths during winter. Clean up any spills in your garage right away. This includes oil or grease spills. What can I do in the bathroom? Use night lights. Install grab bars by the toilet and in the tub and shower. Do not use towel bars as grab bars. Use non-skid mats or decals in the tub or shower. If you need to sit down in the shower, use a plastic, non-slip stool. Keep the floor dry. Clean up any water that spills on the floor as soon as it happens. Remove soap buildup in the tub or shower regularly. Attach  bath mats securely with double-sided non-slip rug tape. Do not have throw rugs and other things on the floor that can make you trip. What can I do in the bedroom? Use night lights. Make sure that you have a light by your bed that is easy to reach. Do not use any sheets or blankets that are too big for your bed. They should not hang down onto the floor. Have a firm chair that has side arms. You can use this for support while you get dressed. Do not have throw rugs and other things on the floor that can make you trip. What can I do in the kitchen? Clean up any spills right away. Avoid walking on wet floors. Keep items that you use a lot in easy-to-reach places. If you need to reach something above you, use a strong step stool that has a grab bar. Keep electrical cords out of the way. Do not use floor polish or wax that makes floors slippery. If you must use wax, use non-skid floor wax. Do not have throw rugs and other things on the floor that can make you trip. What can I do  with my stairs? Do not leave any items on the stairs. Make sure that there are handrails on both sides of the stairs and use them. Fix handrails that are broken or loose. Make sure that handrails are as long as the stairways. Check any carpeting to make sure that it is firmly attached to the stairs. Fix any carpet that is loose or worn. Avoid having throw rugs at the top or bottom of the stairs. If you do have throw rugs, attach them to the floor with carpet tape. Make sure that you have a light switch at the top of the stairs and the bottom of the stairs. If you do not have them, ask someone to add them for you. What else can I do to help prevent falls? Wear shoes that: Do not have high heels. Have rubber bottoms. Are comfortable and fit you well. Are closed at the toe. Do not wear sandals. If you use a stepladder: Make sure that it is fully opened. Do not climb a closed stepladder. Make sure that both sides of the  stepladder are locked into place. Ask someone to hold it for you, if possible. Clearly mark and make sure that you can see: Any grab bars or handrails. First and last steps. Where the edge of each step is. Use tools that help you move around (mobility aids) if they are needed. These include: Canes. Walkers. Scooters. Crutches. Turn on the lights when you go into a dark area. Replace any light bulbs as soon as they burn out. Set up your furniture so you have a clear path. Avoid moving your furniture around. If any of your floors are uneven, fix them. If there are any pets around you, be aware of where they are. Review your medicines with your doctor. Some medicines can make you feel dizzy. This can increase your chance of falling. Ask your doctor what other things that you can do to help prevent falls. This information is not intended to replace advice given to you by your health care provider. Make sure you discuss any questions you have with your health care provider. Document Released: 08/05/2009 Document Revised: 03/16/2016 Document Reviewed: 11/13/2014 Elsevier Interactive Patient Education  2017 [employer].

## 2023-06-28 NOTE — Patient Instructions (Signed)

## 2023-06-29 DIAGNOSIS — I7025 Atherosclerosis of native arteries of other extremities with ulceration: Secondary | ICD-10-CM | POA: Diagnosis not present

## 2023-06-29 DIAGNOSIS — I70245 Atherosclerosis of native arteries of left leg with ulceration of other part of foot: Secondary | ICD-10-CM | POA: Diagnosis not present

## 2023-06-29 NOTE — Telephone Encounter (Signed)
Form mailed to Pt as requested. Copy of form sent for scanning.

## 2023-07-03 ENCOUNTER — Other Ambulatory Visit: Payer: Self-pay | Admitting: Internal Medicine

## 2023-07-04 DIAGNOSIS — I70245 Atherosclerosis of native arteries of left leg with ulceration of other part of foot: Secondary | ICD-10-CM | POA: Diagnosis not present

## 2023-07-04 DIAGNOSIS — E1152 Type 2 diabetes mellitus with diabetic peripheral angiopathy with gangrene: Secondary | ICD-10-CM | POA: Diagnosis not present

## 2023-07-04 DIAGNOSIS — E785 Hyperlipidemia, unspecified: Secondary | ICD-10-CM | POA: Diagnosis not present

## 2023-07-04 DIAGNOSIS — I70213 Atherosclerosis of native arteries of extremities with intermittent claudication, bilateral legs: Secondary | ICD-10-CM | POA: Diagnosis not present

## 2023-07-11 DIAGNOSIS — E785 Hyperlipidemia, unspecified: Secondary | ICD-10-CM | POA: Diagnosis not present

## 2023-07-11 DIAGNOSIS — E1152 Type 2 diabetes mellitus with diabetic peripheral angiopathy with gangrene: Secondary | ICD-10-CM | POA: Diagnosis not present

## 2023-07-11 DIAGNOSIS — I70244 Atherosclerosis of native arteries of left leg with ulceration of heel and midfoot: Secondary | ICD-10-CM | POA: Diagnosis not present

## 2023-07-11 DIAGNOSIS — I10 Essential (primary) hypertension: Secondary | ICD-10-CM | POA: Diagnosis not present

## 2023-07-18 DIAGNOSIS — E785 Hyperlipidemia, unspecified: Secondary | ICD-10-CM | POA: Diagnosis not present

## 2023-07-18 DIAGNOSIS — I10 Essential (primary) hypertension: Secondary | ICD-10-CM | POA: Diagnosis not present

## 2023-07-18 DIAGNOSIS — E1152 Type 2 diabetes mellitus with diabetic peripheral angiopathy with gangrene: Secondary | ICD-10-CM | POA: Diagnosis not present

## 2023-07-18 DIAGNOSIS — L929 Granulomatous disorder of the skin and subcutaneous tissue, unspecified: Secondary | ICD-10-CM | POA: Diagnosis not present

## 2023-07-18 DIAGNOSIS — I70244 Atherosclerosis of native arteries of left leg with ulceration of heel and midfoot: Secondary | ICD-10-CM | POA: Diagnosis not present

## 2023-07-18 DIAGNOSIS — L8990 Pressure ulcer of unspecified site, unspecified stage: Secondary | ICD-10-CM | POA: Diagnosis not present

## 2023-07-18 DIAGNOSIS — I70245 Atherosclerosis of native arteries of left leg with ulceration of other part of foot: Secondary | ICD-10-CM | POA: Diagnosis not present

## 2023-07-19 ENCOUNTER — Other Ambulatory Visit: Payer: Self-pay | Admitting: Family

## 2023-07-19 ENCOUNTER — Telehealth: Payer: Self-pay | Admitting: Internal Medicine

## 2023-07-19 MED ORDER — HYDROCODONE-ACETAMINOPHEN 10-325 MG PO TABS: 1.0000 | ORAL_TABLET | Freq: Four times a day (QID) | ORAL | 0 refills | Status: DC | PRN

## 2023-07-19 NOTE — Telephone Encounter (Signed)
Requesting: hydrocodone 10-325mg   Contract: 11/21/22 UDS: 11/21/22 Last Visit: 06/22/23 Next Visit: 10/22/23 Last Refill: 05/18/23 #120 and 0RF   Please Advise

## 2023-07-19 NOTE — Addendum Note (Signed)
Addended byConrad Harper D on: 07/19/2023 04:34 PM   Modules accepted: Orders

## 2023-07-19 NOTE — Telephone Encounter (Signed)
Prescription Request  07/19/2023  Is this a "Controlled Substance" medicine? Yes  LOV: 06/22/2023  What is the name of the medication or equipment? HYDROcodone-acetaminophen (NORCO) 10-325 MG tablet  Have you contacted your pharmacy to request a refill? No   Which pharmacy would you like this sent to?  [id number] - [place], [place] - [address] AT [facility] [address] [place] [address] Phone: [phone] Fax: [phone]  Patient notified that their request is being sent to the clinical staff for review and that they should receive a response within 2 business days.   Please advise at [facility] [phone]

## 2023-07-23 MED ORDER — HYDROCODONE-ACETAMINOPHEN 10-325 MG PO TABS: 1.0000 | ORAL_TABLET | Freq: Four times a day (QID) | ORAL | 0 refills | Status: DC | PRN

## 2023-07-23 NOTE — Addendum Note (Signed)
Addended by: Abbe Amsterdam C on: 07/23/2023 01:11 PM   Modules accepted: Orders

## 2023-07-23 NOTE — Telephone Encounter (Signed)
Tinnie Gens ([facility] [place]) called stating pt needs to have a supervising doctor or another doctor to call in her hydrocodone as [place] requires it vs just a NP calling it in. Please advise.

## 2023-07-25 DIAGNOSIS — E1152 Type 2 diabetes mellitus with diabetic peripheral angiopathy with gangrene: Secondary | ICD-10-CM | POA: Diagnosis not present

## 2023-07-25 DIAGNOSIS — I70244 Atherosclerosis of native arteries of left leg with ulceration of heel and midfoot: Secondary | ICD-10-CM | POA: Diagnosis not present

## 2023-07-25 DIAGNOSIS — L8990 Pressure ulcer of unspecified site, unspecified stage: Secondary | ICD-10-CM | POA: Diagnosis not present

## 2023-08-02 DIAGNOSIS — E1152 Type 2 diabetes mellitus with diabetic peripheral angiopathy with gangrene: Secondary | ICD-10-CM | POA: Diagnosis not present

## 2023-08-02 DIAGNOSIS — I70244 Atherosclerosis of native arteries of left leg with ulceration of heel and midfoot: Secondary | ICD-10-CM | POA: Diagnosis not present

## 2023-08-08 DIAGNOSIS — E785 Hyperlipidemia, unspecified: Secondary | ICD-10-CM | POA: Diagnosis not present

## 2023-08-08 DIAGNOSIS — I70244 Atherosclerosis of native arteries of left leg with ulceration of heel and midfoot: Secondary | ICD-10-CM | POA: Diagnosis not present

## 2023-08-08 DIAGNOSIS — E1152 Type 2 diabetes mellitus with diabetic peripheral angiopathy with gangrene: Secondary | ICD-10-CM | POA: Diagnosis not present

## 2023-08-10 DIAGNOSIS — R8271 Bacteriuria: Secondary | ICD-10-CM | POA: Diagnosis not present

## 2023-08-10 DIAGNOSIS — N3020 Other chronic cystitis without hematuria: Secondary | ICD-10-CM | POA: Diagnosis not present

## 2023-08-10 NOTE — Progress Notes (Signed)
[name]    [id number]    1935-06-22  Primary Care Physician:Paz, Nolon Rod, MD  Referring Physician: Wanda Plump, MD [address] [place],  [place] [address]   Chief complaint:  No chief complaint on file.  HPI: 88 year old very pleasant female with history of CAD, and STEMI status post CABG, peripheral vascular disease status post partial amputation of left lower leg with prosthesis on chronic antiplatelet and anticoagulation.  Last seen on 10-04-22.  Today,     GI Hx: DG Abd view 2 11-16-21  1. Postoperative changes, as described above, without evidence of bowel obstruction.  CT Abdomen Pelvis w contrast 03-19-21 -Postsurgical changes in the right abdomen consistent with right hemicolectomy and ileocolic anastomosis. The appendix is surgically removed. -Diverticulosis without evidence of diverticulitis. -Changes of prior Nissen fundoplication. -IVC filter and left common iliac vein stent are noted. She is s/p hemorrhoidal band ligation in 2017.  She was evaluated by [facility] for hemorrhoidectomy, was thought not to be a candidate for surgical repair.   CT abdomen and pelvis with contrast 12/24/2015 1. No acute abdominopelvic findings. 2. Colonic diverticulosis without evidence of acute diverticulitis. 3. Single bubble of air within the urinary bladder. Correlate for history of recent instrumentation. 4. Postsurgical changes at the gastroesophageal junction from Nissen fundoplication with small recurrent hiatal hernia.   Colonoscopy March 2017 with internal and external hemorrhoids, left-sided diverticulosis, removal of 3 subcentimeter sessile and pedunculated polyps [tubular adenomas]    Current Outpatient Medications:    Alpha-Lipoic Acid 600 MG CAPS, Take 1,200 mg by mouth daily., Disp: , Rfl:    amitriptyline (ELAVIL) 25 MG tablet, Take 1 tablet (25 mg total) by mouth at bedtime., Disp: 90 tablet, Rfl: 1   Ascorbic Acid  (VITAMIN C WITH ROSE HIPS) 500 MG tablet, Take 500 mg by mouth in the morning and at bedtime., Disp: , Rfl:    atorvastatin (LIPITOR) 40 MG tablet, Take 40 mg by mouth daily., Disp: , Rfl:    carvedilol (COREG) 12.5 MG tablet, Take 12.5 mg by mouth 2 (two) times daily with a meal., Disp: , Rfl:    Cholecalciferol (VITAMIN D3) 5000 UNITS CAPS, Take 5,000 Units by mouth every evening. Reported on 11/24/2015, Disp: , Rfl:    CINNAMON PO, Take 2,000 mg by mouth daily., Disp: , Rfl:    clopidogrel (PLAVIX) 75 MG tablet, Take 1 tablet (75 mg total) by mouth at bedtime. Restart on 12/04/2020, Disp: , Rfl:    Cranberry 200 MG CAPS, , Disp: , Rfl:    Cyanocobalamin (VITAMIN B-12) 5000 MCG SUBL, Place 5,000 mcg under the tongue daily., Disp: , Rfl:    dextromethorphan-guaiFENesin (TUSSIN DM) 10-100 MG/5ML liquid, Take by mouth every 4 (four) hours as needed for cough., Disp: , Rfl:    diclofenac Sodium (VOLTAREN) 1 % GEL, Apply 4 g topically 4 (four) times daily. (Patient taking differently: Apply 4 g topically 4 (four) times daily. If needed for pain), Disp: 100 g, Rfl: 0   dicyclomine (BENTYL) 10 MG capsule, TAKE 1 CAPSULE BY MOUTH THREE TIMES DAILY BEFORE MEALS, Disp: 270 capsule, Rfl: 2   donepezil (ARICEPT) 10 MG tablet, TAKE 1 TABLET AT BEDTIME, Disp: 90 tablet, Rfl: 3   famotidine (PEPCID) 20 MG tablet, Take 1 tablet (20 mg total) by mouth 2 (two) times daily., Disp: 180 tablet, Rfl: 1   furosemide (LASIX) 20 MG tablet, Take 20 mg  by mouth daily., Disp: , Rfl:    glucose blood (FREESTYLE LITE) test strip, USE TO CHECK BLOOD SUGAR NO MORE THAN TWICE DAILY, Disp: 200 each, Rfl: 12   HYDROcodone-acetaminophen (NORCO) 10-325 MG tablet, Take 1 tablet by mouth every 6 (six) hours as needed., Disp: 120 tablet, Rfl: 0   hydrocortisone (ANUSOL-HC) 2.5 % rectal cream, Place 1 Application rectally 2 (two) times daily. As needed, Disp: 30 g, Rfl: 3   hydrocortisone (ANUSOL-HC) 25 MG suppository, Place 1 suppository  (25 mg total) rectally 2 (two) times daily. As needed, Disp: 12 suppository, Rfl: 5   hydrocortisone 2.5 % cream, APPLY TOPICALLY TO THE AFFECTED AREA TWICE DAILY, Disp: 30 g, Rfl: 1   ketoconazole (NIZORAL) 2 % cream, Apply 1 Application topically 2 (two) times daily., Disp: 60 g, Rfl: 0   Lancets (FREESTYLE) lancets, CHECK BLOOD SUGAR TWICE DAILY, Disp: 200 each, Rfl: 12   linaclotide (LINZESS) 72 MCG capsule, Take 1 capsule (72 mcg total) by mouth daily before breakfast., Disp: 90 capsule, Rfl: 3   loperamide (IMODIUM A-D) 2 MG tablet, Take 2 mg by mouth as needed for diarrhea or loose stools., Disp: , Rfl:    MegaRed Omega-3 Krill Oil 500 MG CAPS, Restart on 12/04/2020, Disp: , Rfl:    methenamine (HIPREX) 1 g tablet, Take 0.5 tablets by mouth daily., Disp: , Rfl: 4   Multiple Vitamins-Minerals (PRESERVISION AREDS 2) CAPS, Take 1 capsule by mouth 2 (two) times daily., Disp: , Rfl:    naloxone (NARCAN) nasal spray 4 mg/0.1 mL, Place 1 spray into the nose once as needed for up to 1 dose. (Patient not taking: Reported on 06/22/2023), Disp: 2 each, Rfl: 0   nitroGLYCERIN (NITROSTAT) 0.4 MG SL tablet, Place 1 tablet (0.4 mg total) under the tongue every 5 (five) minutes x 3 doses as needed for chest pain. (Patient not taking: Reported on 06/22/2023), Disp: 25 tablet, Rfl: 3   omeprazole (PRILOSEC) 20 MG capsule, TAKE 1 CAPSULE TWICE A DAY BEFORE MEALS, Disp: 180 capsule, Rfl: 3   ondansetron (ZOFRAN) 4 MG tablet, Take 1 tablet (4 mg total) by mouth every 8 (eight) hours as needed for nausea or vomiting., Disp: 40 tablet, Rfl: 1   OVER THE COUNTER MEDICATION, 1 tablet 3 (three) times daily. Berberorine Gluco Defense, Disp: , Rfl:    Polyvinyl Alcohol-Povidone (REFRESH OP), Place 1 drop into both eyes 3 (three) times daily as needed (dry eyes). , Disp: , Rfl:    pramipexole (MIRAPEX) 0.125 MG tablet, Take 1-2 tablets (0.125-0.25 mg total) by mouth at bedtime as needed., Disp: 180 tablet, Rfl: 1   Prenatal  Vit-Fe Fumarate-FA (PRENATAL MULTIVITAMIN) TABS tablet, Take 1 tablet by mouth daily at 12 noon., Disp: , Rfl:    Probiotic Product (PROBIOTIC PEARLS PO), Take 1 tablet by mouth daily., Disp: , Rfl:    rivaroxaban (XARELTO) 2.5 MG TABS tablet, Take 1 tablet (2.5 mg total) by mouth 2 (two) times daily. Restart on 12/04/2020, Disp: 60 tablet, Rfl:    silver sulfADIAZINE (SILVADENE) 1 % cream, Apply 1 application topically daily. (Patient not taking: Reported on 06/22/2023), Disp: 50 g, Rfl: 0   vitamin E 180 MG (400 UNITS) capsule, Take 400 Units by mouth daily., Disp: , Rfl:    Zinc Sulfate 66 MG TABS, , Disp: , Rfl:     Allergies as of 08/14/2023 - Review Complete 06/28/2023  Allergen Reaction Noted   Metformin and related Diarrhea, Nausea Only, and Other (See Comments) 12/10/2015  Cymbalta [duloxetine hcl] Swelling 09/25/2012   Gabapentin Swelling 09/25/2012   Silicone Hives, Itching, Dermatitis, and Rash 04/11/2022   Tramadol Swelling 10/28/2021   Adhesive [tape] Rash 07/17/2011   Cefazolin Hives 03/06/2012   Ciprofloxacin Other (See Comments) 01/31/2013   Clorazepate dipotassium Other (See Comments)    Dilaudid [hydromorphone hcl] Itching 03/11/2010   Doxycycline Rash 11/03/2008   Enablex [darifenacin hydrobromide er] Other (See Comments) 03/06/2012   Levofloxacin Other (See Comments) 01/08/2014   Lyrica [pregabalin] Swelling 05/08/2013   Methadone hcl Other (See Comments)    Morphine and codeine Other (See Comments) 10/31/2013   Talwin [pentazocine] Other (See Comments) 12/04/2012    Past Medical History:  Diagnosis Date   Allergic rhinitis    Arthritis    "back; ankles; hands; knees" (10/31/2013)   Bilateral sensorineural hearing loss    CAD (coronary artery disease)    a. Nonobst in 2011. b. Abnormal nuc 09/2013 -> s/p cutting balloon to D2, mild LAD disease; c. 08/2015 MV: no ischemia, EF 71%.   Carcinoma in situ in a polyp 1994   a. 1994 - malignant polyp removed during  colonoscopy.   Chronic diastolic CHF (congestive heart failure) (HCC)    a. 09/2013 Echo: EF 60-65%, mild LVH, Gr1 DD.   Dementia (HCC)    Diverticulosis    DJD (degenerative joint disease)    Fatty liver    GERD (gastroesophageal reflux disease)    a. Hx GERD/esophageal dysmotility followed by Dr. Juanda Chance.    Hemorrhoids    Hiatal hernia    a. s/p Nissen fundoplication 2011.   Hyperlipidemia    a. patient unwilling to use statins.   Hypertensive heart disease    IBS (irritable bowel syndrome)    Macular degeneration 03/2009   Dr. Earlene Plater   Neuropathy    a. Hands, feet, legs.   Orthostatic hypotension    Osteomyelitis (HCC)    a. 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 01/2016 s/p RLE transtibial amputation 2/2 Charcot rocker-bottom deformity and insensate neuropathy ulceration.   Osteoporosis    PE (pulmonary embolism) 12/2007   a. PE/DVT after neck surgery 2009. b. coumadin d/c 10-2008.   PONV (postoperative nausea and vomiting) 2009   neck surgery   PPD positive    Recurrent UTI    RSD (reflex sympathetic dystrophy)    a. Chronic pain.   Spinal stenosis    Type II diabetes mellitus (HCC)    no on medication   Venous insufficiency    a. Contributing to LEE.    Past Surgical History:  Procedure Laterality Date   AMPUTATION  03/06/2012   Procedure: AMPUTATION FOOT;  Surgeon: Nadara Mustard, MD;  Location: [facility] OR;  Service: Orthopedics;  Laterality: Left;  FIFTH RAY AMPUTATION    AMPUTATION Right 02/18/2016   Procedure: AMPUTATION BELOW KNEE;  Surgeon: Nadara Mustard, MD;  Location: [facility] OR;  Service: Orthopedics;  Laterality: Right;   AMPUTATION Left 11/22/2016   Procedure: Amputation 4th Toe Left Foot at Metatarsophalangeal Joint;  Surgeon: Nadara Mustard, MD;  Location: [facility] OR;  Service: Orthopedics;  Laterality: Left;   ANKLE FUSION  09/27/2012   Procedure: ANKLE FUSION;  Surgeon: Nadara Mustard, MD;  Location: [facility] OR;   Service: Orthopedics;  Laterality: Left;  Left Tibiocalcaneal Fusion   ANKLE FUSION Right 05/09/2013   Procedure: ANKLE FUSION;  Surgeon: Nadara Mustard, MD;  Location[facility] OR;  Service: Orthopedics;  Laterality: Right;  Excision Osteomyelitis Base 1st MT Right Foot, Fusion Medial Column   ANTERIOR CERVICAL DECOMP/DISCECTOMY FUSION  12/18/07   For OA,  Dr. Ophelia Charter:  fu by a PE   ANTERIOR CERVICAL DECOMP/DISCECTOMY FUSION N/A 11/29/2020   Procedure: Anterior Cervical Decompression Fusion - Cervical Three-Cerivcal Four;  Surgeon: Julio Sicks, MD;  Location: [facility] OR;  Service: Neurosurgery;  Laterality: N/A;  3C   CARDIAC CATHETERIZATION  05/2010    at [facility] TUNNEL RELEASE Left 09/2011   CATARACT EXTRACTION W/ INTRAOCULAR LENS  IMPLANT, BILATERAL  2004   feb 2004 left, aug 2004 right   CHOLECYSTECTOMY  03/2002   CORONARY ANGIOPLASTY  10/31/2013   CORONARY ARTERY BYPASS GRAFT  09/27/2016   CYST REMOVAL HAND  06/2003   FOOT BONE EXCISION Right 06/2009   LAPAROSCOPIC RIGHT HEMI COLECTOMY N/A 01/08/2015   Procedure: LAPAROSCOPIC ASSISTED RIGHT HEMI COLECTOMY;  Surgeon: Luretha Murphy, MD;  Location: [facility] ORS;  Service: General;  Laterality: N/A;   LEFT HEART CATHETERIZATION WITH CORONARY ANGIOGRAM N/A 10/31/2013   Procedure: LEFT HEART CATHETERIZATION WITH CORONARY ANGIOGRAM;  Surgeon: Corky Crafts, MD;  Location: [facility] CATH LAB;  Service: Cardiovascular;  Laterality: N/A;   NASAL SEPTUM SURGERY  09/1963   NISSEN FUNDOPLICATION  01/25/2010   PERCUTANEOUS CORONARY INTERVENTION-BALLOON ONLY  10/31/2013   Procedure: PERCUTANEOUS CORONARY INTERVENTION-BALLOON ONLY;  Surgeon: Corky Crafts, MD;  Location: [facility] CATH LAB;  Service: Cardiovascular;;   ROTATOR CUFF REPAIR Right 07/2007   Dr. Gwenith Spitz CUFF REPAIR Left 11/2004   stent in left leg, behind knee  06/27/2017   x2, done at [facility] cardiology in [facility]   TOE AMPUTATION Right 10/05/08   3rd toe, Dr. Lajoyce Corners due to osteomyelitis    VAGINAL HYSTERECTOMY  03/1975   VEIN LIGATION Bilateral 03/1966   VENA CAVA FILTER PLACEMENT  01/2010   green filter; "due to blood clots"   WISDOM TOOTH EXTRACTION  06/2007    Family History  Problem Relation Age of Onset   Heart disease Mother        mitral valve replaced   Arthritis Mother    Diabetic kidney disease Mary    Diabetes Father    Stroke Father    Migraines Sister    Hypertension Other    Breast cancer Other    Colon cancer Neg Hx    Esophageal cancer Neg Hx    Stomach cancer Neg Hx    Rectal cancer Neg Hx     Social History   Socioeconomic History   Marital status: Divorced    Spouse name: Not on file   Number of children: 3   Years of education: 15   Highest education level: Some college, no degree  Occupational History   Occupation: Retired     Comment: from social services   Tobacco Use   Smoking status: Never   Smokeless tobacco: Never   Tobacco comments:    tried for a few months in college.   Vaping Use   Vaping status: Never Used  Substance and Sexual Activity   Alcohol use: No    Alcohol/week: 0.0 standard drinks of alcohol   Drug use: No   Sexual activity: Not Currently    Birth control/protection: Post-menopausal  Other Topics Concern   Not on file  Social History Narrative   [name] deceased 05-Oct-2020   Mary lives in town   1 son in [place], Tinnie Gens   Caffeine use:  Tea 1/day  w/ dinner   Coffee occass    Currently lives in [place] with son Traca Gillock.   Social Determinants of Health   Financial Resource Strain: Low Risk  (03/20/2023)   Overall Financial Resource Strain (CARDIA)    Difficulty of Paying Living Expenses: Not hard at all  Food Insecurity: No Food Insecurity (03/20/2023)   Hunger Vital Sign    Worried About Running Out of Food in the Last Year: Never true    Ran Out of Food in the Last Year: Never true  Transportation Needs: No Transportation Needs (03/20/2023)   PRAPARE - Scientist, research (physical sciences) (Medical): No    Lack of Transportation (Non-Medical): No  Physical Activity: Inactive (06/28/2023)   Exercise Vital Sign    Days of Exercise per Week: 0 days    Minutes of Exercise per Session: 0 min  Stress: Patient Declined (03/20/2023)   [employer] of Occupational Health - Occupational Stress Questionnaire    Feeling of Stress : Patient declined  Social Connections: Socially Isolated (03/20/2023)   Social Connection and Isolation Panel [NHANES]    Frequency of Communication with Friends and Family: More than three times a week    Frequency of Social Gatherings with Friends and Family: More than three times a week    Attends Religious Services: Never    Database administrator or Organizations: No    Attends Engineer, structural: Not on file    Marital Status: Divorced  Intimate Partner Violence: Not At Risk (06/28/2023)   Humiliation, Afraid, Rape, and Kick questionnaire    Fear of Current or Ex-Partner: No    Emotionally Abused: No    Physically Abused: No    Sexually Abused: No     Review of systems: Review of Systems    Physical Exam: General: well-appearing ***  Eyes: sclera anicteric, no redness ENT: oral mucosa moist without lesions, no cervical or supraclavicular lymphadenopathy CV: RRR, no JVD, no peripheral edema Resp: clear to auscultation bilaterally, normal RR and effort noted GI: soft, no tenderness, with active bowel sounds. No guarding or palpable organomegaly noted. Skin; warm and dry, no rash or jaundice noted Neuro: awake, alert and oriented x 3. Normal gross motor function and fluent speech   Data Reviewed:  Reviewed labs, radiology imaging, old records and pertinent past GI work up   Assessment and Plan/Recommendations:  88 year old female with CAD s/p CABG, peripheral vascular disease s/p left leg amputation on Plavix and Xarelto here with complaints of irregular bowel habits with alternating constipation/diarrhea,  intermittent rectal bleeding and iron deficiency anemia   Irritable bowel syndrome with alternating constipation and diarrhea Advised patient to use additional stool softener, docusate daily as needed to prevent constipation.  If she is having difficulty evacuating, she can use glycerin suppositories as needed Encouraged patient to increase dietary fiber and increase water to 8 cups daily   Rectal bleeding: secondary to small-volume hemorrhage from internal hemorrhoids in the setting of dual antiplatelet therapy and anticoagulation She is not a candidate for hemorrhoidal band ligation or hemorrhoidectomy We will try to conservatively manage with Anusol cream and suppositories as needed Avoid excessive straining during defecation  Iron deficiency anemia secondary to chronic GI blood loss/rectal bleeding.  Discussed in detail the potential benefit and risks associated diagnostic endoscopic evaluation, patient is not a surgical candidate.  Patient and family opted to defer invasive procedures, continue conservative management.  Continue monitoring and IV iron infusion as needed through hematology  Return as needed   This visit required 40 minutes of patient care (this includes precharting, chart review, review of results, face-to-face time used for counseling as well as treatment plan and follow-up. The patient and her son were provided an opportunity to ask questions and all were answered. The patient and her son agreed with the plan and demonstrated an understanding of the instructions.   Iona Beard , MD    Ladona Mow Hewitt Shorts as a scribe for Marsa Aris, MD.,have documented all relevant documentation on the behalf of Marsa Aris, MD,as directed by  Marsa Aris, MD while in the presence of Marsa Aris, MD.   I, Marsa Aris, MD, have reviewed all documentation for this visit. The documentation on 08/10/23 for the exam, diagnosis, procedures, and orders are  all accurate and complete.   CC: Wanda Plump, MD

## 2023-08-14 ENCOUNTER — Encounter: Payer: Self-pay | Admitting: Gastroenterology

## 2023-08-14 ENCOUNTER — Ambulatory Visit (INDEPENDENT_AMBULATORY_CARE_PROVIDER_SITE_OTHER): Payer: Medicare Other | Admitting: Gastroenterology

## 2023-08-14 VITALS — BP 146/66 | HR 83 | Ht 65.5 in | Wt 158.0 lb

## 2023-08-14 DIAGNOSIS — Z7901 Long term (current) use of anticoagulants: Secondary | ICD-10-CM | POA: Diagnosis not present

## 2023-08-14 DIAGNOSIS — K582 Mixed irritable bowel syndrome: Secondary | ICD-10-CM | POA: Diagnosis not present

## 2023-08-14 DIAGNOSIS — K649 Unspecified hemorrhoids: Secondary | ICD-10-CM | POA: Diagnosis not present

## 2023-08-14 DIAGNOSIS — K625 Hemorrhage of anus and rectum: Secondary | ICD-10-CM | POA: Diagnosis not present

## 2023-08-14 DIAGNOSIS — R350 Frequency of micturition: Secondary | ICD-10-CM | POA: Diagnosis not present

## 2023-08-14 DIAGNOSIS — K529 Noninfective gastroenteritis and colitis, unspecified: Secondary | ICD-10-CM

## 2023-08-14 DIAGNOSIS — L97409 Non-pressure chronic ulcer of unspecified heel and midfoot with unspecified severity: Secondary | ICD-10-CM

## 2023-08-14 DIAGNOSIS — K648 Other hemorrhoids: Secondary | ICD-10-CM

## 2023-08-14 DIAGNOSIS — K219 Gastro-esophageal reflux disease without esophagitis: Secondary | ICD-10-CM | POA: Diagnosis not present

## 2023-08-14 DIAGNOSIS — D500 Iron deficiency anemia secondary to blood loss (chronic): Secondary | ICD-10-CM | POA: Diagnosis not present

## 2023-08-14 MED ORDER — LINACLOTIDE 72 MCG PO CAPS: 72.0000 ug | ORAL_CAPSULE | Freq: Every day | ORAL | 3 refills | Status: DC

## 2023-08-14 MED ORDER — HYDROCORTISONE ACETATE 25 MG RE SUPP: 25.0000 mg | Freq: Two times a day (BID) | RECTAL | 11 refills | Status: DC

## 2023-08-14 MED ORDER — OMEPRAZOLE 20 MG PO CPDR: 20.0000 mg | DELAYED_RELEASE_CAPSULE | Freq: Every day | ORAL | 3 refills | Status: DC

## 2023-08-14 MED ORDER — HYDROCORT-PRAMOXINE (PERIANAL) 2.5-1 % EX CREA: 1.0000 | TOPICAL_CREAM | Freq: Two times a day (BID) | CUTANEOUS | 0 refills | Status: DC

## 2023-08-14 NOTE — Patient Instructions (Addendum)
VISIT SUMMARY:  During your visit, we discussed your ongoing issues with hemorrhoids, a non-healing sore on your heel, frequent urination, and gastroesophageal reflux disease (GERD). Your hemorrhoids have been causing significant bleeding and discomfort, which we believe is worsened by prolonged sitting and straining. Your heel sore, while chronic, has shown improvement with skin grafts. Your frequent urination has improved with antibiotics, and your GERD is currently asymptomatic.  YOUR PLAN:  -HEMORRHOIDS: Hemorrhoids are swollen veins in your lower rectum and anus that can cause discomfort and bleeding. To manage this, continue using hemorrhoid cream and suppositories as needed, but not continuously for more than 5-7 days. Try to change positions frequently, avoid sitting for long periods, and limit your time on the toilet to 3-5 minutes. We will also check your blood count to see if the bleeding has caused anemia.  IBS constipation and diarrhea: use Linzess daily, Benefiber 1 tablespoon three times daily with meals  -FREQUENT URINATION: You've been experiencing frequent urination, which has improved with antibiotics. Continue with your current management plan.  -GASTROESOPHAGEAL REFLUX DISEASE (GERD): GERD is a chronic condition where stomach acid frequently flows back into the tube connecting your mouth and stomach (esophagus). You have a history of GERD, but currently, you are not experiencing symptoms. We will reduce your omeprazole to as-needed use for heartburn symptoms.  INSTRUCTIONS:  Please continue with your current treatment plans for each of your conditions. We will follow up in approximately one year to assess your progress and make any necessary adjustments to your treatment plans.  I appreciate the  opportunity to care for you  Thank You   Marsa Aris , MD

## 2023-08-15 ENCOUNTER — Encounter: Payer: Self-pay | Admitting: Neurology

## 2023-08-15 ENCOUNTER — Ambulatory Visit (INDEPENDENT_AMBULATORY_CARE_PROVIDER_SITE_OTHER): Payer: Medicare Other | Admitting: Neurology

## 2023-08-15 ENCOUNTER — Encounter: Payer: Self-pay | Admitting: Gastroenterology

## 2023-08-15 ENCOUNTER — Encounter: Payer: Self-pay | Admitting: Family

## 2023-08-15 VITALS — BP 135/64 | HR 83 | Ht 64.0 in | Wt 156.0 lb

## 2023-08-15 DIAGNOSIS — G5603 Carpal tunnel syndrome, bilateral upper limbs: Secondary | ICD-10-CM

## 2023-08-15 NOTE — Patient Instructions (Signed)
Carpal Tunnel Syndrome  Carpal tunnel syndrome is a condition that causes pain, numbness, and weakness in your hand and fingers. The carpal tunnel is a narrow area located on the palm side of your wrist. Repeated wrist motion or certain diseases may cause swelling within the tunnel. This swelling pinches the main nerve in the wrist. The main nerve in the wrist is called the median nerve. What are the causes? This condition may be caused by: Repeated and forceful wrist and hand motions. Wrist injuries. Arthritis. A cyst or tumor in the carpal tunnel. Fluid buildup during pregnancy. Use of tools that vibrate. Sometimes the cause of this condition is not known. What increases the risk? The following factors may make you more likely to develop this condition: Having a job that requires you to repeatedly or forcefully move your wrist or hand or requires you to use tools that vibrate. This may include jobs that involve using computers, working on an [employer], or working with power tools such as Radiographer, therapeutic. Being a woman. Having certain conditions, such as: Diabetes. Obesity. An underactive thyroid (hypothyroidism). Kidney failure. Rheumatoid arthritis. What are the signs or symptoms? Symptoms of this condition include: A tingling feeling in your fingers, especially in your thumb, index, and middle fingers. Tingling or numbness in your hand. An aching feeling in your entire arm, especially when your wrist and elbow are bent for a long time. Wrist pain that goes up your arm to your shoulder. Pain that goes down into your palm or fingers. A weak feeling in your hands. You may have trouble grabbing and holding items. Your symptoms may feel worse during the night. How is this diagnosed? This condition is diagnosed with a medical history and physical exam. You may also have tests, including: Electromyogram (EMG). This test measures electrical signals sent by your nerves into the  muscles. Nerve conduction study. This test measures how well electrical signals pass through your nerves. Imaging tests, such as X-rays, ultrasound, and MRI. These tests check for possible causes of your condition. How is this treated? This condition may be treated with: Lifestyle changes. It is important to stop or change the activity that caused your condition. Doing exercise and activities to strengthen and stretch your muscles and tendons (physical therapy). Making lifestyle changes to help with your condition and learning how to do your daily activities safely (occupational therapy). Medicines for pain and inflammation. This may include medicine that is injected into your wrist. A wrist splint or brace. Surgery. Follow these instructions at home: If you have a splint or brace: Wear the splint or brace as told by your health care provider. Remove it only as told by your health care provider. Loosen the splint or brace if your fingers tingle, become numb, or turn cold and blue. Keep the splint or brace clean. If the splint or brace is not waterproof: Do not let it get wet. Cover it with a watertight covering when you take a bath or shower. Managing pain, stiffness, and swelling If directed, put ice on the painful area. To do this: If you have a removeable splint or brace, remove it as told by your health care provider. Put ice in a plastic bag. Place a towel between your skin and the bag or between the splint or brace and the bag. Leave the ice on for 20 minutes, 2-3 times a day. Do not fall asleep with the cold pack on your skin. Remove the ice if your skin turns  bright red. This is very important. If you cannot feel pain, heat, or cold, you have a greater risk of damage to the area. Move your fingers often to reduce stiffness and swelling. General instructions Take over-the-counter and prescription medicines only as told by your health care provider. Rest your wrist and hand from  any activity that may be causing your pain. If your condition is work related, talk with your employer about changes that can be made, such as getting a wrist pad to use while typing. Do any exercises as told by your health care provider, physical therapist, or occupational therapist. Keep all follow-up visits. This is important. Contact a health care provider if: You have new symptoms. Your pain is not controlled with medicines. Your symptoms get worse. Get help right away if: You have severe numbness or tingling in your wrist or hand. Summary Carpal tunnel syndrome is a condition that causes pain, numbness, and weakness in your hand and fingers. It is usually caused by repeated wrist motions. Lifestyle changes and medicines are used to treat carpal tunnel syndrome. Surgery may be recommended. Follow your health care provider's instructions about wearing a splint, resting from activity, keeping follow-up visits, and calling for help. This information is not intended to replace advice given to you by your health care provider. Make sure you discuss any questions you have with your health care provider. Document Revised: 02/19/2020 Document Reviewed: 02/19/2020 Elsevier Patient Education  2024 [employer]  Electromyoneurogram Electromyoneurogram is a test to check how well your muscles and nerves are working. This procedure includes the combined use of electromyogram (EMG) and nerve conduction study (NCS). EMG is used to evaluate muscles and the nerves that control those muscles. NCS, which is also called electroneurogram, measures how well your nerves conduct electricity. The procedures should be done together to check if your muscles and nerves are healthy. If the results of the tests are abnormal, this may indicate disease or injury, such as a neuromuscular disease or peripheral nerve damage. Tell a health care provider about: Any allergies you have. All medicines you are taking,  including vitamins, herbs, eye drops, creams, and over-the-counter medicines. Any bleeding problems you have. Any surgeries you have had. Any medical conditions you have. What are the risks? Generally, this is a safe procedure. However, problems may occur, including: Bleeding or bruising. Infection where the electrodes were inserted. What happens before the test? Medicines Take all of your usually prescribed medications before this testing is performed. Do not stop your blood thinners unless advised by your prescribing physician. General instructions Your health care provider may ask you to warm the limb that will be checked with warm water, hot pack, or wrapping the limb in a blanket. Do not use lotions or creams on the same day that you will be having the procedure. What happens during the test? For EMG  Your health care provider will ask you to stay in a position so that the muscle being studied can be accessed. You will be sitting or lying down. You may be given a medicine to numb the area (local anesthetic) and the skin will be disinfected. A very thin needle that has an electrode will be inserted into your muscle, one muscle at a time. Typically, multiple muscles are evaluated during a single study. Another small electrode will be placed on your skin near the muscle. Your health care provider will ask you to continue to remain still. The electrodes will record the electrical activity of your  muscles. You may see this on a monitor or hear it in the room. After your muscles have been studied at rest, your health care provider will ask you to contract or flex your muscles. The electrodes will record the electrical activity of your muscles. Your health care provider will remove the electrodes and the electrode needle when the procedure is finished. The procedure may vary among health care providers and hospitals. For NCS  An electrode that records your nerve activity (recording  electrode) will be placed on your skin by the muscle that is being studied. An electrode that is used as a reference (reference electrode) will be placed near the recording electrode. A paste or gel will be applied to your skin between the recording electrode and the reference electrode. Your nerve will be stimulated with a mild shock. The speed of the nerves and strength of response is recorded by the electrodes. Your health care provider will remove the electrodes and the gel when the procedure is finished. The procedure may vary among health care providers and hospitals. What can I expect after the test? It is up to you to get your test results. Ask your health care provider, or the department that is doing the test, when your results will be ready. Your health care provider may: Give you medicines for any pain. Monitor the insertion sites to make sure that bleeding stops. You should be able to drive yourself to and from the test. Discomfort can persist for a few hours after the test, but should be better the next day. Contact a health care provider if: You have swelling, redness, or drainage at any of the insertion sites. Summary Electromyoneurogram is a test to check how well your muscles and nerves are working. If the results of the tests are abnormal, this may indicate disease or injury. This is a safe procedure. However, problems may occur, such as bleeding and infection. Your health care provider will do two tests to complete this procedure. One checks your muscles (EMG) and another checks your nerves (NCS). It is up to you to get your test results. Ask your health care provider, or the department that is doing the test, when your results will be ready. This information is not intended to replace advice given to you by your health care provider. Make sure you discuss any questions you have with your health care provider. Document Revised: 06/22/2021 Document Reviewed:  05/22/2021 Elsevier Patient Education  2024 [employer].

## 2023-08-15 NOTE — Progress Notes (Signed)
[facility]  Provider:  Dr Lucia Gaskins Requesting Provider: Wanda Plump, MD Primary Care Provider:  Wanda Plump, MD  CC:  chronic migraines  08/21/2023: She has a wound on her left heel and has a boot, she is in a wheelchair and cannot walk because of it. She has hand pain. Son provides much information. Started this past summer. Fingers are tingling. It hurts so much she can't keep up. She can't do anything with her fingers. Constantly hurting and numb. If she tries to pick something up. It is in the hands. Son states she has a weaker grip and cannot open things. Not dropping things. Weaker grip. Unclear when it is worse, unclear if worse at night ot in the morning. They hurt. Tingling and throbbing. Can;t bend them. From the wrist down. Did see a rheumatologist once.  Had CTS years ago and surgery. +mcphalen's maneuver. Symptoms worse in the left hand, Symptoms do not radiate past the wrists,son here and provides much information. In a wheelchair, Eyes moving normally, pupils pinpoint.  Reviewed images and labs:  Recent Results (from the past 2160 hour(s))  Reticulocytes     Status: Abnormal   Collection Time: 06/18/23  2:38 PM  Result Value Ref Range   Retic Ct Pct 1.0 0.4 - 3.1 %   RBC. 3.21 (L) 3.87 - 5.11 MIL/uL   Retic Count, Absolute 32.4 19.0 - 186.0 K/uL   Immature Retic Fract 13.6 2.3 - 15.9 %    Comment: Performed at [facility] at [facility], [address] [place], [place] [address]  Iron and Iron Binding Capacity ([facility] only)     Status: None   Collection Time: 06/18/23  2:39 PM  Result Value Ref Range   Iron 46 28 - 170 ug/dL   TIBC 604 540 - 981 ug/dL   Saturation Ratios 13 10.4 - 31.8 %   UIBC 304 148 - 442 ug/dL    Comment: Performed at [facility], [address] [place], [place] [address]  Ferritin     Status: Abnormal   Collection Time: 06/18/23  2:39 PM  Result Value Ref Range    Ferritin 10 (L) 11 - 307 ng/mL    Comment: Performed at [facility], [address], [place], [place] [address]  CBC with Differential (Cancer Center Only)     Status: Abnormal   Collection Time: 06/18/23  2:39 PM  Result Value Ref Range   WBC Count 2.9 (L) 4.0 - 10.5 K/uL   RBC 3.18 (L) 3.87 - 5.11 MIL/uL   Hemoglobin 9.4 (L) 12.0 - 15.0 g/dL   HCT 21.3 (L) 08.6 - 57.8 %   MCV 96.2 80.0 - 100.0 fL   MCH 29.6 26.0 - 34.0 pg   MCHC 30.7 30.0 - 36.0 g/dL   RDW 46.9 62.9 - 52.8 %   Platelet Count 116 (L) 150 - 400 K/uL   nRBC 0.0 0.0 - 0.2 %   Neutrophils Relative % 45 %   Neutro Abs 1.3 (L) 1.7 - 7.7 K/uL   Lymphocytes Relative 43 %   Lymphs Abs 1.3 0.7 - 4.0 K/uL   Monocytes Relative 8 %   Monocytes Absolute 0.2 0.1 - 1.0 K/uL   Eosinophils Relative 3 %   Eosinophils Absolute 0.1 0.0 - 0.5 K/uL   Basophils Relative 0 %   Basophils Absolute 0.0 0.0 - 0.1 K/uL   Immature Granulocytes 1 %   Abs  Immature Granulocytes 0.03 0.00 - 0.07 K/uL    Comment: Performed at [facility] at [facility], [address] [place], [place] [address]  ALT     Status: None   Collection Time: 06/22/23  3:15 PM  Result Value Ref Range   ALT 13 6 - 29 U/L  AST     Status: None   Collection Time: 06/22/23  3:15 PM  Result Value Ref Range   AST 18 10 - 35 U/L  B12 and Folate Panel     Status: None   Collection Time: 06/22/23  3:15 PM  Result Value Ref Range   Vitamin B-12 543 200 - 1,100 pg/mL   Folate >24.0 ng/mL    Comment:                            Reference Range                            Low:           <3.4                            Borderline:    3.4-5.4                            Normal:        >5.4 .   Basic metabolic panel     Status: Abnormal   Collection Time: 06/22/23  3:15 PM  Result Value Ref Range   Glucose, Bld 87 65 - 99 mg/dL    Comment: .            Fasting reference interval .    BUN 22 7 - 25 mg/dL   Creat 1.32 (H)  4.40 - 0.95 mg/dL   BUN/Creatinine Ratio 20 6 - 22 (calc)   Sodium 143 135 - 146 mmol/L   Potassium 4.4 3.5 - 5.3 mmol/L   Chloride 111 (H) 98 - 110 mmol/L    Comment: Verified by repeat analysis. .    CO2 24 20 - 32 mmol/L   Calcium 8.5 (L) 8.6 - 10.4 mg/dL  TSH     Status: None   Collection Time: 06/22/23  3:15 PM  Result Value Ref Range   TSH 1.44 0.40 - 4.50 mIU/L  Vitamin D (25 hydroxy)     Status: None   Collection Time: 06/22/23  3:15 PM  Result Value Ref Range   Vit D, 25-Hydroxy 81 30 - 100 ng/mL    Comment: Vitamin D Status         25-OH Vitamin D: . Deficiency:                    <20 ng/mL Insufficiency:             20 - 29 ng/mL Optimal:                 > or = 30 ng/mL . For 25-OH Vitamin D testing on patients on  D2-supplementation and patients for whom quantitation  of D2 and D3 fractions is required, the QuestAssureD(TM) 25-OH VIT D, (D2,D3), LC/MS/MS is recommended: order  code 10272 (patients >9yrs). . See Note 1 . Note 1 . For additional information, please refer to  http://education.Quest[facility].com/faq/FAQ199  (This  link is being provided for informational/ educational purposes only.)     Xr right hand 2022: EXAM: RIGHT HAND - COMPLETE 3+ VIEW   COMPARISON:  None.   FINDINGS: Mild degenerative changes in the IP joints. No acute bony abnormality. Specifically, no fracture, subluxation, or dislocation.   IMPRESSION: No acute bony abnormality  Patient complains of symptoms per HPI as well as the following symptoms: none . Pertinent negatives and positives per HPI. All others negative   Interval history 08/17/2021: Patient here for follow up of migraines. Patient with multiple complaints however at last appointment we discussed I can follow her for her headaches. She was last started on Emgality. She has not been able to get the [facility]. She is getting headaches every other day.Happens in the afternoon. Gave her samples for 5 months and  will see her back.   HPI:  KEAH PRUSS is a 88 y.o. female here as requested by Wanda Plump, MD for neck pain.  Patient is well-known to [place] neurology and we have seen her for multiple conditions.  She has a very complicated and extensive history including a past medical history of chronic migraines, spinal stenosis, reflex sympathetic dystrophy, osteoporosis, orthostatic hypotension, polyneuropathy, hypertension, hyperlipidemia, falls, head injury due to trauma, DVTs, degenerative joint disease, chronic diastolic congestive heart failure, coronary artery disease, hearing loss, arthritis, chronic pain, peripheral vascular disease, diabetes, depression, lower extremity edema, partial colectomy, memory loss, below the knee amputation. No significant snoring. She doesn't wake up with headaches. She has had headaches so bad she had nausea but that may have been the new medication. We need to restart Emgality, she only had 2 doses, will give samples of emgality to last 5 months and see if the cgrps even help.She was started on Tramadol for pain. She was given aricept by Dr. Drue Novel.  Here with her son who provides most information. She had 2 months of Emgality and they stopped they had never heard from the pharmacy about it.   Patient today is here with her son who also provides much information.  Patient has a long history of multiple chronic pain disorders of which she we have evaluated and treated her for chronic migraines migraines, chronic dizziness and vertigo, headaches, chronic neck pain, multiple falls, ocular migraines.  Today she is here stating she still having headaches and migraines, she has chronic neck and back pain, she has tried heat, massage, dry needling in her shoulders in the past, she also reports that she still having falls however talking to her son it appears that she bent over to pick up a dog bed which she really should not have been doing giving her multiple risk factors for falls, we have  tried multiple medications for her migraines, and patient currently has multiple medications on her list polypharmacy.  I had a long discussion with her and her son today, at this time I do not feel as though we should put another oral medication on her list we can try Emgality for her and I did inject her in the office today for her migraines.  Patient complaining of falls again at this time I told patient she has multiple risk factors for falls and she cannot do things like bend over and pick up dog beds that puts her more at risk of falls, there is nothing that we can do at this point to mitigate her fall risk except have her not do things that we will inevitably lead to her  losing her balance.  Review of Systems: Patient complains of symptoms per HPI as well as the following symptoms: migraines, dizziness, neck pain, back pain, falls. Pertinent negatives and positives per HPI. All others negative.   Social History   Socioeconomic History   Marital status: Divorced    Spouse name: Not on file   Number of children: 3   Years of education: 15   Highest education level: Some college, no degree  Occupational History   Occupation: Retired     Comment: from social services   Tobacco Use   Smoking status: Never   Smokeless tobacco: Never   Tobacco comments:    tried for a few months in college.   Vaping Use   Vaping status: Never Used  Substance and Sexual Activity   Alcohol use: No    Alcohol/week: 0.0 standard drinks of alcohol   Drug use: No   Sexual activity: Not Currently    Birth control/protection: Post-menopausal  Other Topics Concern   Not on file  Social History Narrative   Daughter Angelique Blonder deceased September 17, 2020   daughter lives in town   1 son in [place], Tinnie Gens   Caffeine use:  Tea 1/day w/ dinner   Coffee occass    Currently lives in [place] with son Jenitza Skelley.   Social Determinants of Health   Financial Resource Strain: Low Risk  (03/20/2023)   Overall Financial Resource  Strain (CARDIA)    Difficulty of Paying Living Expenses: Not hard at all  Food Insecurity: No Food Insecurity (03/20/2023)   Hunger Vital Sign    Worried About Running Out of Food in the Last Year: Never true    Ran Out of Food in the Last Year: Never true  Transportation Needs: No Transportation Needs (03/20/2023)   PRAPARE - Administrator, Civil Service (Medical): No    Lack of Transportation (Non-Medical): No  Physical Activity: Inactive (06/28/2023)   Exercise Vital Sign    Days of Exercise per Week: 0 days    Minutes of Exercise per Session: 0 min  Stress: Patient Declined (03/20/2023)   [employer] of Occupational Health - Occupational Stress Questionnaire    Feeling of Stress : Patient declined  Social Connections: Socially Isolated (03/20/2023)   Social Connection and Isolation Panel [NHANES]    Frequency of Communication with Friends and Family: More than three times a week    Frequency of Social Gatherings with Friends and Family: More than three times a week    Attends Religious Services: Never    Database administrator or Organizations: No    Attends Engineer, structural: Not on file    Marital Status: Divorced  Intimate Partner Violence: Not At Risk (06/28/2023)   Humiliation, Afraid, Rape, and Kick questionnaire    Fear of Current or Ex-Partner: No    Emotionally Abused: No    Physically Abused: No    Sexually Abused: No    Family History  Problem Relation Age of Onset   Heart disease Mother        mitral valve replaced   Arthritis Mother    Diabetic kidney disease Daughter    Diabetes Father    Stroke Father    Migraines Sister    Hypertension Other    Breast cancer Other    Colon cancer Neg Hx    Esophageal cancer Neg Hx    Stomach cancer Neg Hx    Rectal cancer Neg Hx     Past  Medical History:  Diagnosis Date   Allergic rhinitis    Arthritis    "back; ankles; hands; knees" (10/31/2013)   Bilateral sensorineural hearing loss     CAD (coronary artery disease)    a. Nonobst in 2011. b. Abnormal nuc 09/2013 -> s/p cutting balloon to D2, mild LAD disease; c. 08/2015 MV: no ischemia, EF 71%.   Carcinoma in situ in a polyp 1994   a. 1994 - malignant polyp removed during colonoscopy.   Chronic diastolic CHF (congestive heart failure) (HCC)    a. 09/2013 Echo: EF 60-65%, mild LVH, Gr1 DD.   Dementia (HCC)    Diverticulosis    DJD (degenerative joint disease)    Fatty liver    GERD (gastroesophageal reflux disease)    a. Hx GERD/esophageal dysmotility followed by Dr. Juanda Chance.    Hemorrhoids    Hiatal hernia    a. s/p Nissen fundoplication 2011.   Hyperlipidemia    a. patient unwilling to use statins.   Hypertensive heart disease    IBS (irritable bowel syndrome)    Macular degeneration 03/2009   Dr. Earlene Plater   Neuropathy    a. Hands, feet, legs.   Orthostatic hypotension    Osteomyelitis (HCC)    a. 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 01/2016 s/p RLE transtibial amputation 2/2 Charcot rocker-bottom deformity and insensate neuropathy ulceration.   Osteoporosis    PE (pulmonary embolism) 12/2007   a. PE/DVT after neck surgery 2009. b. coumadin d/c 10-2008.   PONV (postoperative nausea and vomiting) 2009   neck surgery   PPD positive    Recurrent UTI    RSD (reflex sympathetic dystrophy)    a. Chronic pain.   Spinal stenosis    Type II diabetes mellitus (HCC)    no on medication   Venous insufficiency    a. Contributing to LEE.   Wound of left foot     Patient Active Problem List   Diagnosis Date Noted   Bilateral shoulder pain 11/20/2022   Bilateral hand pain 11/20/2022   IDA (iron deficiency anemia) 04/11/2022   Dementia (HCC) 10/30/2021   Chronic migraine without aura, with intractable migraine, so stated, with status migrainosus 08/17/2021   Cervical myelopathy (HCC) 11/29/2020   Chronic idiopathic constipation 11/12/2020   Lumbar  compression fracture (HCC) 09/29/2020   Other spondylosis with radiculopathy, cervical region 09/29/2020   External hemorrhoids 02/15/2018   History of three vessel coronary artery bypass - 2017 , in [facility] 09/25/2017   PVD (peripheral vascular disease) (HCC)-- stent L leg 06-2017 Dr Docia Barrier [facility] 07/23/2017   Phantom pain 04/01/2017   Constipation, chronic 04/01/2017   S/P unilateral BKA (below knee amputation), right (HCC) 12/14/2016   Toe osteomyelitis, left (HCC) 11/17/2016   Acute on chronic diastolic CHF (congestive heart failure), NYHA class 3 (HCC) 03/30/2016   Coronary artery disease due to lipid rich plaque 03/30/2016   Acute renal failure superimposed on stage 3 chronic kidney disease (HCC) 03/30/2016   S/P below knee amputation (HCC) 02/18/2016   Memory loss 11/26/2015   PCP NOTES >>>>>>>>>>>>>>>>>>>> 10/18/2015   S/P partial colectomy 01/08/2015   Diarrhea 01/29/2014   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 01/28/2014   Anemia 01/18/2014   CAD (coronary artery disease)    Carcinoma in situ in a polyp    Depression 09/17/2013   Charcot's joint 08/21/2013   Foot osteomyelitis, right (HCC) 07/16/2013   Chronic pain associated with significant psychosocial  dysfunction 05/06/2013   Neuropathy    Hyperthyroidism    Lower extremity edema 03/08/2011   Annual physical exam 03/08/2011   Tachycardia 12/07/2010   ALLERGIC RHINITIS 04/15/2010   ORTHOSTATIC DIZZINESS 04/13/2010   HIATAL HERNIA 12/06/2009   GASTRIC ULCER, HX OF 12/06/2009   DYSPNEA 03/18/2009   DEGENERATIVE JOINT DISEASE 06/03/2008   DIVERTICULOSIS, COLON 01/27/2008   IRRITABLE BOWEL SYNDROME, HX OF 01/27/2008   Pulmonary nodule, right 01/22/2008   Hyperlipidemia 12/30/2007   Osteoporosis 12/30/2007   TB SKIN TEST, POSITIVE 08/09/2007   DM type 2 with diabetic peripheral neuropathy (HCC) 05/09/2007   Reflex sympathetic dystrophy-- PAIN MNGMT  05/09/2007   Essential hypertension 04/15/2007   GERD  04/15/2007    Past Surgical History:  Procedure Laterality Date   AMPUTATION  03/06/2012   Procedure: AMPUTATION FOOT;  Surgeon: Nadara Mustard, MD;  Location: [facility] OR;  Service: Orthopedics;  Laterality: Left;  FIFTH RAY AMPUTATION    AMPUTATION Right 02/18/2016   Procedure: AMPUTATION BELOW KNEE;  Surgeon: Nadara Mustard, MD;  Location: [facility] OR;  Service: Orthopedics;  Laterality: Right;   AMPUTATION Left 11/22/2016   Procedure: Amputation 4th Toe Left Foot at Metatarsophalangeal Joint;  Surgeon: Nadara Mustard, MD;  Location: [facility] OR;  Service: Orthopedics;  Laterality: Left;   ANKLE FUSION  09/27/2012   Procedure: ANKLE FUSION;  Surgeon: Nadara Mustard, MD;  Location: [facility] OR;  Service: Orthopedics;  Laterality: Left;  Left Tibiocalcaneal Fusion   ANKLE FUSION Right 05/09/2013   Procedure: ANKLE FUSION;  Surgeon: Nadara Mustard, MD;  Location: [facility] OR;  Service: Orthopedics;  Laterality: Right;  Excision Osteomyelitis Base 1st MT Right Foot, Fusion Medial Column   ANTERIOR CERVICAL DECOMP/DISCECTOMY FUSION  12/18/07   For OA,  Dr. Ophelia Charter:  fu by a PE   ANTERIOR CERVICAL DECOMP/DISCECTOMY FUSION N/A 11/29/2020   Procedure: Anterior Cervical Decompression Fusion - Cervical Three-Cerivcal Four;  Surgeon: Julio Sicks, MD;  Location: [facility] OR;  Service: Neurosurgery;  Laterality: N/A;  3C   CARDIAC CATHETERIZATION  05/2010    at [facility] TUNNEL RELEASE Left 09/2011   CATARACT EXTRACTION W/ INTRAOCULAR LENS  IMPLANT, BILATERAL  2004   feb 2004 left, aug 2004 right   CHOLECYSTECTOMY  03/2002   CORONARY ANGIOPLASTY  10/31/2013   CORONARY ARTERY BYPASS GRAFT  09/27/2016   CYST REMOVAL HAND  06/2003   FOOT BONE EXCISION Right 06/2009   LAPAROSCOPIC RIGHT HEMI COLECTOMY N/A 01/08/2015   Procedure: LAPAROSCOPIC ASSISTED RIGHT HEMI COLECTOMY;  Surgeon: Luretha Murphy, MD;  Location: [facility] ORS;  Service: General;  Laterality: N/A;   LEFT HEART CATHETERIZATION WITH CORONARY ANGIOGRAM N/A 10/31/2013   Procedure: LEFT HEART  CATHETERIZATION WITH CORONARY ANGIOGRAM;  Surgeon: Corky Crafts, MD;  Location[facility] LAB;  Service: Cardiovascular;  Laterality: N/A;   NASAL SEPTUM SURGERY  09/1963   NISSEN FUNDOPLICATION  01/25/2010   PERCUTANEOUS CORONARY INTERVENTION-BALLOON ONLY  10/31/2013   Procedure: PERCUTANEOUS CORONARY INTERVENTION-BALLOON ONLY;  Surgeon: Corky Crafts, MD;  Location: [facility] CATH LAB;  Service: Cardiovascular;;   ROTATOR CUFF REPAIR Right 07/2007   Dr. Gwenith Spitz CUFF REPAIR Left 11/2004   stent in left leg, behind knee  06/27/2017   x2, done at [facility] in [facility]   TOE AMPUTATION Right 08/2008   3rd toe, Dr. Lajoyce Corners due to osteomyelitis   VAGINAL HYSTERECTOMY  03/1975   VEIN LIGATION Bilateral 03/1966   VENA CAVA FILTER  PLACEMENT  01/2010   green filter; "due to blood clots"   WISDOM TOOTH EXTRACTION  06/2007    Current Outpatient Medications  Medication Sig Dispense Refill   Alpha-Lipoic Acid 600 MG CAPS Take 1,200 mg by mouth daily.     amitriptyline (ELAVIL) 25 MG tablet Take 1 tablet (25 mg total) by mouth at bedtime. 90 tablet 1   Ascorbic Acid (VITAMIN C WITH ROSE HIPS) 500 MG tablet Take 500 mg by mouth in the morning and at bedtime.     atorvastatin (LIPITOR) 40 MG tablet Take 40 mg by mouth daily.     carvedilol (COREG) 12.5 MG tablet Take 12.5 mg by mouth 2 (two) times daily with a meal.     Cholecalciferol (VITAMIN D3) 5000 UNITS CAPS Take 5,000 Units by mouth every evening. Reported on 11/24/2015     CINNAMON PO Take 2,000 mg by mouth daily.     clopidogrel (PLAVIX) 75 MG tablet Take 1 tablet (75 mg total) by mouth at bedtime. Restart on 12/04/2020     Cranberry 200 MG CAPS      Cyanocobalamin (VITAMIN B-12) 5000 MCG SUBL Place 5,000 mcg under the tongue every other day.     dextromethorphan-guaiFENesin (TUSSIN DM) 10-100 MG/5ML liquid Take by mouth every 4 (four) hours as needed for cough.     diclofenac Sodium (VOLTAREN) 1 % GEL Apply 4 g topically 4  (four) times daily. (Patient taking differently: Apply 4 g topically 4 (four) times daily. If needed for pain) 100 g 0   dicyclomine (BENTYL) 10 MG capsule TAKE 1 CAPSULE BY MOUTH THREE TIMES DAILY BEFORE MEALS 270 capsule 2   donepezil (ARICEPT) 10 MG tablet TAKE 1 TABLET AT BEDTIME 90 tablet 3   famotidine (PEPCID) 20 MG tablet Take 1 tablet (20 mg total) by mouth 2 (two) times daily. 180 tablet 1   furosemide (LASIX) 20 MG tablet Take 20 mg by mouth daily.     glucose blood (FREESTYLE LITE) test strip USE TO CHECK BLOOD SUGAR NO MORE THAN TWICE DAILY 200 each 12   HYDROcodone-acetaminophen (NORCO) 10-325 MG tablet Take 1 tablet by mouth every 6 (six) hours as needed. 120 tablet 0   hydrocortisone (ANUSOL-HC) 25 MG suppository Place 1 suppository (25 mg total) rectally 2 (two) times daily. As needed 24 suppository 11   hydrocortisone 2.5 % cream APPLY TOPICALLY TO THE AFFECTED AREA TWICE DAILY 30 g 1   hydrocortisone-pramoxine (ANALPRAM HC) 2.5-1 % rectal cream Place 1 Application rectally in the morning and at bedtime. As needed 30 g 0   ketoconazole (NIZORAL) 2 % cream Apply 1 Application topically 2 (two) times daily. 60 g 0   Lancets (FREESTYLE) lancets CHECK BLOOD SUGAR TWICE DAILY 200 each 12   linaclotide (LINZESS) 72 MCG capsule Take 1 capsule (72 mcg total) by mouth daily before breakfast. 90 capsule 3   loperamide (IMODIUM A-D) 2 MG tablet Take 2 mg by mouth as needed for diarrhea or loose stools.     MegaRed Omega-3 Krill Oil 500 MG CAPS Restart on 12/04/2020     methenamine (HIPREX) 1 g tablet Take 0.5 tablets by mouth daily.  4   Multiple Vitamins-Minerals (PRESERVISION AREDS 2) CAPS Take 1 capsule by mouth 2 (two) times daily.     naloxone (NARCAN) nasal spray 4 mg/0.1 mL Place 1 spray into the nose once as needed for up to 1 dose. 2 each 0   nitroGLYCERIN (NITROSTAT) 0.4 MG SL tablet Place 1 tablet (  0.4 mg total) under the tongue every 5 (five) minutes x 3 doses as needed for chest  pain. 25 tablet 3   omeprazole (PRILOSEC) 20 MG capsule Take 1 capsule (20 mg total) by mouth daily. As needed 90 capsule 3   ondansetron (ZOFRAN) 4 MG tablet Take 1 tablet (4 mg total) by mouth every 8 (eight) hours as needed for nausea or vomiting. 40 tablet 1   OVER THE COUNTER MEDICATION 1 tablet 3 (three) times daily. Berberorine Gluco Defense     Polyvinyl Alcohol-Povidone (REFRESH OP) Place 1 drop into both eyes 3 (three) times daily as needed (dry eyes).      pramipexole (MIRAPEX) 0.125 MG tablet Take 1-2 tablets (0.125-0.25 mg total) by mouth at bedtime as needed. 180 tablet 1   Prenatal Vit-Fe Fumarate-FA (PRENATAL MULTIVITAMIN) TABS tablet Take 1 tablet by mouth daily at 12 noon.     Probiotic Product (PROBIOTIC PEARLS PO) Take 1 tablet by mouth daily.     rivaroxaban (XARELTO) 2.5 MG TABS tablet Take 1 tablet (2.5 mg total) by mouth 2 (two) times daily. Restart on 12/04/2020 60 tablet    silver sulfADIAZINE (SILVADENE) 1 % cream Apply 1 application topically daily. 50 g 0   vitamin E 180 MG (400 UNITS) capsule Take 400 Units by mouth daily.     Zinc Sulfate 66 MG TABS      No current facility-administered medications for this visit.    Allergies as of 08/15/2023 - Review Complete 08/15/2023  Allergen Reaction Noted   Metformin and related Diarrhea, Nausea Only, and Other (See Comments) 12/10/2015   Cymbalta [duloxetine hcl] Swelling 09/25/2012   Gabapentin Swelling 09/25/2012   Silicone Hives, Itching, Dermatitis, and Rash 04/11/2022   Tramadol Swelling 10/28/2021   Adhesive [tape] Rash 07/17/2011   Cefazolin Hives 03/06/2012   Ciprofloxacin Other (See Comments) 01/31/2013   Clorazepate dipotassium Other (See Comments)    Dilaudid [hydromorphone hcl] Itching 03/11/2010   Doxycycline Rash 11/03/2008   Enablex [darifenacin hydrobromide er] Other (See Comments) 03/06/2012   Levofloxacin Other (See Comments) 01/08/2014   Lyrica [pregabalin] Swelling 05/08/2013   Methadone hcl  Other (See Comments)    Morphine and codeine Other (See Comments) 10/31/2013   Talwin [pentazocine] Other (See Comments) 12/04/2012    Vitals: BP 135/64 (BP Location: Right Arm, Patient Position: Sitting)   Pulse 83   Ht 5\' 4"  (1.626 m)   Wt 156 lb (70.8 kg) Comment: son reported, pt in wheelchair  BMI 26.78 kg/m  Last Weight:  Wt Readings from Last 1 Encounters:  08/15/23 156 lb (70.8 kg)   Last Height:   Ht Readings from Last 1 Encounters:  08/15/23 5\' 4"  (1.626 m)     Physical exam: Exam: Gen: NAD, conversant, well nourised,  Neuro: Detailed Neurologic Exam  Speech:    Speech is normal; fluent and spontaneous with normal comprehension.  Cognition:    The patient is oriented to person, place, and time;  Cranial Nerves:    The pupils are pinpoint, round, and reactive to light. Visual fields are full to threat. Extraocular movements are intact. Trigeminal sensation is intact and the muscles of mastication are normal. The face is symmetric. The palate elevates in the midline. Hearing impaired. Voice is normal. Shoulder shrug is normal. The tongue has normal motion without fasciculations.   Gait:   In a wheelchair  Motor Observation:    No asymmetry, no atrophy, and no involuntary movements noted. Tone:    Normal muscle tone.  Strength:    Strength is actually quite good and I do not appreciate any focal weakness. BKA., left LE RSD, she has leg brace; Some generalized weakness in the uppers but symmetrical and antigravity 4=4+/5; Opponens and grip weak      Assessment/Plan:  88 year old with likely CTS  Orders Placed This Encounter  Procedures   NCV with EMG(electromyography)    Prior assessment and plan: 88 y.o. female here as requested by Nadara Mustard, MD for neck pain.  Patient is well-known to [place] neurolog[facility] and we have seen her for multiple conditions.  She has a very complicated and extensive history including a past medical history of chronic migraines,  spinal stenosis, reflex sympathetic dystrophy, osteoporosis, orthostatic hypotension, polyneuropathy, hypertension, hyperlipidemia, falls, head injury due to trauma, DVTs, degenerative joint disease, chronic diastolic congestive heart failure, coronary artery disease, hearing loss, arthritis, chronic pain, peripheral vascular disease, diabetes, depression, lower extremity edema, partial colectomy, memory loss, below the knee amputation, joint pain, reflex sympathetic dystrophy leg, BKA. She has been to orthopaedics and pain specialists and currently sees Dr. Lajoyce Corners in Ortho, many years of chronic neck and back pain and ambulation issues for many reasons, she has tried multiple neuropathic pain medications in the past.   - she hadn't taken Emgality, gave samples, will try that as current plan and see her back in 5 months  -  I had a long discussion with her and her son today, at this time I do not feel as though we should put another oral medication on her list due to polypharmacy, we can try Emgality for her and I did inject her in the office today for her migraines.   - Patient complaining of falls again and not understanding why she falls - I told patient she has multiple risk factors for falls and she cannot do things like bend over and pick up dog beds because that puts her more at risk of falls; doing things like this will inevitably lead to her losing her balance due to her extensive problems as above. I will image her brain and cervical spine to ensure no strokes, myelopathy or other causes but at this time patient needs to be compliant with fall prevention recommendations.  - Patient already sees orthopaedics for her many years of chronic neck and back pain and is managed by Dr. Lajoyce Corners who recently adjusted some pain management medications. She can continue to follow with Dr. Lajoyce Corners but I will image her brain and cervical spine as above. - Spent a very long time discussing fall risks, polypharmacy, fall  prevention, migraines, chronic neck and back pain.We willimage her briain and cervical spine. Patient to follow with Dr. Lajoyce Corners and pain management for these. We can continue to follow her for migraines.  Orders Placed This Encounter  Procedures   NCV with EMG(electromyography)    No orders of the defined types were placed in this encounter.    Cc: Wanda Plump, MD,  Wanda Plump, MD  Naomie Dean, MD  [facility] [address] [place], [place] [address]  Phone [phone] Fax [phone]  I spent over 30  minutes of face-to-face and non-face-to-face time with patient on the  1. Bilateral carpal tunnel syndrome     diagnosis.  This included previsit chart review, lab review, study review, order entry, electronic health record documentation, patient education on the different diagnostic and therapeutic options, counseling and coordination of care, risks and benefits of management, compliance,  or risk factor reduction

## 2023-08-17 DIAGNOSIS — E1152 Type 2 diabetes mellitus with diabetic peripheral angiopathy with gangrene: Secondary | ICD-10-CM | POA: Diagnosis not present

## 2023-08-17 DIAGNOSIS — I10 Essential (primary) hypertension: Secondary | ICD-10-CM | POA: Diagnosis not present

## 2023-08-17 DIAGNOSIS — I70244 Atherosclerosis of native arteries of left leg with ulceration of heel and midfoot: Secondary | ICD-10-CM | POA: Diagnosis not present

## 2023-08-22 ENCOUNTER — Telehealth: Payer: Self-pay | Admitting: Pharmacy Technician

## 2023-08-22 ENCOUNTER — Other Ambulatory Visit (HOSPITAL_COMMUNITY): Payer: Self-pay

## 2023-08-22 NOTE — Telephone Encounter (Signed)
Pharmacy Patient Advocate Encounter   Received notification from CoverMyMeds that prior authorization for LINZESS is required/requested.   Insurance verification completed.   The patient is insured through [employer] .   Per test claim: PA required; PA submitted to above mentioned insurance via CoverMyMeds Key/confirmation #/EOC [id number] Status is pending

## 2023-08-23 DIAGNOSIS — I10 Essential (primary) hypertension: Secondary | ICD-10-CM | POA: Diagnosis not present

## 2023-08-23 DIAGNOSIS — I70244 Atherosclerosis of native arteries of left leg with ulceration of heel and midfoot: Secondary | ICD-10-CM | POA: Diagnosis not present

## 2023-08-23 DIAGNOSIS — E1152 Type 2 diabetes mellitus with diabetic peripheral angiopathy with gangrene: Secondary | ICD-10-CM | POA: Diagnosis not present

## 2023-08-23 DIAGNOSIS — E785 Hyperlipidemia, unspecified: Secondary | ICD-10-CM | POA: Diagnosis not present

## 2023-08-29 ENCOUNTER — Telehealth: Payer: Self-pay | Admitting: Internal Medicine

## 2023-08-29 MED ORDER — DONEPEZIL HCL 10 MG PO TABS: 10.0000 mg | ORAL_TABLET | Freq: Every day | ORAL | 3 refills | Status: DC

## 2023-08-29 NOTE — Telephone Encounter (Signed)
Rx sent 

## 2023-08-29 NOTE — Addendum Note (Signed)
Addended byConrad  D on: 08/29/2023 01:43 PM   Modules accepted: Orders

## 2023-08-29 NOTE — Telephone Encounter (Signed)
Prescription Request  08/29/2023  Is this a "Controlled Substance" medicine? Yes  LOV: 06/22/2023  What is the name of the medication or equipment?   donepezil (ARICEPT) 10 MG tablet [[id number]]  Have you contacted your pharmacy to request a refill? No   Which pharmacy would you like this sent to?   [facility] #[id number] [place], [place] - [address] AT [facility] OF [facility] [address] [facility] [address] [place] [address] Phone: [phone] Fax: [phone]  Patient notified that their request is being sent to the clinical staff for review and that they should receive a response within 2 business days.   Please advise at Mobile [phone] (mobile)

## 2023-08-30 ENCOUNTER — Ambulatory Visit (INDEPENDENT_AMBULATORY_CARE_PROVIDER_SITE_OTHER): Payer: Medicare Other | Admitting: Orthopedic Surgery

## 2023-08-30 DIAGNOSIS — M79641 Pain in right hand: Secondary | ICD-10-CM | POA: Diagnosis not present

## 2023-08-30 DIAGNOSIS — M79642 Pain in left hand: Secondary | ICD-10-CM | POA: Diagnosis not present

## 2023-08-30 DIAGNOSIS — L97421 Non-pressure chronic ulcer of left heel and midfoot limited to breakdown of skin: Secondary | ICD-10-CM

## 2023-08-31 ENCOUNTER — Encounter: Payer: Self-pay | Admitting: Orthopedic Surgery

## 2023-08-31 DIAGNOSIS — E785 Hyperlipidemia, unspecified: Secondary | ICD-10-CM | POA: Diagnosis not present

## 2023-08-31 DIAGNOSIS — E1152 Type 2 diabetes mellitus with diabetic peripheral angiopathy with gangrene: Secondary | ICD-10-CM | POA: Diagnosis not present

## 2023-08-31 DIAGNOSIS — I70213 Atherosclerosis of native arteries of extremities with intermittent claudication, bilateral legs: Secondary | ICD-10-CM | POA: Diagnosis not present

## 2023-08-31 DIAGNOSIS — I10 Essential (primary) hypertension: Secondary | ICD-10-CM | POA: Diagnosis not present

## 2023-08-31 DIAGNOSIS — I70244 Atherosclerosis of native arteries of left leg with ulceration of heel and midfoot: Secondary | ICD-10-CM | POA: Diagnosis not present

## 2023-08-31 NOTE — Progress Notes (Signed)
Office Visit Note   Patient: [name]           Date of Birth: 09-05-1935           MRN: [id number] Visit Date: 08/30/2023              Requested by: Wanda Plump, MD [address] [place],  [place] [address] PCP: Wanda Plump, MD  Chief Complaint  Patient presents with   Right Wrist - Pain   Left Wrist - Pain      HPI: Patient is an 88 year old woman who presents complaining of bilateral hand pain and weakness.  She is status post carpal tunnel surgery in 2012 she feels like she has recurrent carpal tunnel syndrome.  She states that she has pain and weakness in all fingers of both hands.  Patient also has a decubitus left heel ulcer she states she is undergoing treatment in [place] with application of amnion and chorion tissue graft.  Assessment & Plan: Visit Diagnoses:  1. Bilateral hand pain     Plan: Patient's hand symptoms seem to be more arthritic than carpal tunnel symptoms.  Recommended paraffin baths and topical Voltaren gel.  The ulcer was debrided she will follow-up for wound care in [place].  Recommended a Prevalon boot for the left foot.  She states she cannot wear a PRAFO.  Follow-Up Instructions: Return in about 4 weeks (around 09/27/2023).   Ortho Exam  Patient is alert, oriented, no adenopathy, well-dressed, normal affect, normal respiratory effort. Examination of both hands she does have Heberden and Bouchard nodes.  She states that she does not have any pain in the morning no pain with start up she states she has pain all the time and she states it started with pain in the tips of all fingers and now has weakness in both hands.  There is no redness no cellulitis.  She does not have carpal tunnel symptoms.  Examination of the left foot she has a large ulcer.  After informed consent a 10 blade knife was used to debride the skin and soft tissue back to healthy viable tissue.  The ulcer does not probe to bone.  It pressure dressing was applied.   After debridement the ulcer is 3 cm in diameter and 5 mm deep.  Imaging: No results found. No images are attached to the encounter.  Labs: Lab Results  Component Value Date   HGBA1C 5.2 03/21/2023   HGBA1C 5.4 07/03/2022   HGBA1C 5.1 02/27/2022   ESRSEDRATE 6 11/20/2022   ESRSEDRATE 1 07/03/2022   ESRSEDRATE 9 06/12/2014   CRP 0.8 11/20/2022   REPTSTATUS 06/04/2021 FINAL 06/02/2021   CULT >=100,000 COLONIES/mL ESCHERICHIA COLI (A) 06/02/2021   LABORGA ESCHERICHIA COLI (A) 06/02/2021     Lab Results  Component Value Date   ALBUMIN 3.3 (L) 04/11/2022   ALBUMIN 3.7 02/27/2022   ALBUMIN 3.5 06/01/2021    Lab Results  Component Value Date   MG 1.5 03/30/2016   Lab Results  Component Value Date   VD25OH 81 06/22/2023   VD25OH 65 10/20/2011    No results found for: "PREALBUMIN"    Latest Ref Rng & Units 06/18/2023    2:39 PM 06/18/2023    2:38 PM 12/26/2022    1:22 PM  CBC EXTENDED  WBC 4.0 - 10.5 K/uL 2.9   4.2   RBC 3.87 - 5.11 MIL/uL 3.18  3.21  3.54    3.48   Hemoglobin  12.0 - 15.0 g/dL 9.4   95.6   HCT 21.3 - 46.0 % 30.6   34.8   Platelets 150 - 400 K/uL 116   135   NEUT# 1.7 - 7.7 K/uL 1.3   2.2   Lymph# 0.7 - 4.0 K/uL 1.3   1.5      There is no height or weight on file to calculate BMI.  Orders:  No orders of the defined types were placed in this encounter.  No orders of the defined types were placed in this encounter.    Procedures: No procedures performed  Clinical Data: No additional findings.  ROS:  All other systems negative, except as noted in the HPI. Review of Systems  Objective: Vital Signs: There were no vitals taken for this visit.  Specialty Comments:  No specialty comments available.  PMFS History: Patient Active Problem List   Diagnosis Date Noted   Bilateral shoulder pain 11/20/2022   Bilateral hand pain 11/20/2022   IDA (iron deficiency anemia) 04/11/2022   Dementia (HCC) 10/30/2021   Chronic migraine without aura,  with intractable migraine, so stated, with status migrainosus 08/17/2021   Cervical myelopathy (HCC) 11/29/2020   Chronic idiopathic constipation 11/12/2020   Lumbar compression fracture (HCC) 09/29/2020   Other spondylosis with radiculopathy, cervical region 09/29/2020   External hemorrhoids 02/15/2018   History of three vessel coronary artery bypass - 2017 , in [facility] 09/25/2017   PVD (peripheral vascular disease) (HCC)-- stent L leg 06-2017 Dr Docia Barrier [facility] 07/23/2017   Phantom pain 04/01/2017   Constipation, chronic 04/01/2017   S/P unilateral BKA (below knee amputation), right (HCC) 12/14/2016   Toe osteomyelitis, left (HCC) 11/17/2016   Acute on chronic diastolic CHF (congestive heart failure), NYHA class 3 (HCC) 03/30/2016   Coronary artery disease due to lipid rich plaque 03/30/2016   Acute renal failure superimposed on stage 3 chronic kidney disease (HCC) 03/30/2016   S/P below knee amputation (HCC) 02/18/2016   Memory loss 11/26/2015   PCP NOTES >>>>>>>>>>>>>>>>>>>> 10/18/2015   S/P partial colectomy 01/08/2015   Diarrhea 01/29/2014   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 01/28/2014   Anemia 01/18/2014   CAD (coronary artery disease)    Carcinoma in situ in a polyp    Depression 09/17/2013   Charcot's joint 08/21/2013   Foot osteomyelitis, right (HCC) 07/16/2013   Chronic pain associated with significant psychosocial dysfunction 05/06/2013   Neuropathy    Hyperthyroidism    Lower extremity edema 03/08/2011   Annual physical exam 03/08/2011   Tachycardia 12/07/2010   ALLERGIC RHINITIS 04/15/2010   ORTHOSTATIC DIZZINESS 04/13/2010   HIATAL HERNIA 12/06/2009   GASTRIC ULCER, HX OF 12/06/2009   DYSPNEA 03/18/2009   DEGENERATIVE JOINT DISEASE 06/03/2008   DIVERTICULOSIS, COLON 01/27/2008   IRRITABLE BOWEL SYNDROME, HX OF 01/27/2008   Pulmonary nodule, right 01/22/2008   Hyperlipidemia 12/30/2007   Osteoporosis 12/30/2007   TB SKIN TEST, POSITIVE 08/09/2007    DM type 2 with diabetic peripheral neuropathy (HCC) 05/09/2007   Reflex sympathetic dystrophy-- PAIN MNGMT  05/09/2007   Essential hypertension 04/15/2007   GERD 04/15/2007   Past Medical History:  Diagnosis Date   Allergic rhinitis    Arthritis    "back; ankles; hands; knees" (10/31/2013)   Bilateral sensorineural hearing loss    CAD (coronary artery disease)    a. Nonobst in 2011. b. Abnormal nuc 09/2013 -> s/p cutting balloon to D2, mild LAD disease; c. 08/2015 MV: no ischemia, EF 71%.   Carcinoma in situ  in a polyp 1994   a. 1994 - malignant polyp removed during colonoscopy.   Chronic diastolic CHF (congestive heart failure) (HCC)    a. 09/2013 Echo: EF 60-65%, mild LVH, Gr1 DD.   Dementia (HCC)    Diverticulosis    DJD (degenerative joint disease)    Fatty liver    GERD (gastroesophageal reflux disease)    a. Hx GERD/esophageal dysmotility followed by Dr. Juanda Chance.    Hemorrhoids    Hiatal hernia    a. s/p Nissen fundoplication 2011.   Hyperlipidemia    a. patient unwilling to use statins.   Hypertensive heart disease    IBS (irritable bowel syndrome)    Macular degeneration 03/2009   Dr. Earlene Plater   Neuropathy    a. Hands, feet, legs.   Orthostatic hypotension    Osteomyelitis (HCC)    a. 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 01/2016 s/p RLE transtibial amputation 2/2 Charcot rocker-bottom deformity and insensate neuropathy ulceration.   Osteoporosis    PE (pulmonary embolism) 12/2007   a. PE/DVT after neck surgery 2009. b. coumadin d/c 10-2008.   PONV (postoperative nausea and vomiting) 2009   neck surgery   PPD positive    Recurrent UTI    RSD (reflex sympathetic dystrophy)    a. Chronic pain.   Spinal stenosis    Type II diabetes mellitus (HCC)    no on medication   Venous insufficiency    a. Contributing to LEE.   Wound of left foot     Family History  Problem Relation Age of Onset   Heart disease  Mother        mitral valve replaced   Arthritis Mother    Diabetic kidney disease Daughter    Diabetes Father    Stroke Father    Migraines Sister    Hypertension Other    Breast cancer Other    Colon cancer Neg Hx    Esophageal cancer Neg Hx    Stomach cancer Neg Hx    Rectal cancer Neg Hx     Past Surgical History:  Procedure Laterality Date   AMPUTATION  03/06/2012   Procedure: AMPUTATION FOOT;  Surgeon: Nadara Mustard, MD;  Location: [facility] OR;  Service: Orthopedics;  Laterality: Left;  FIFTH RAY AMPUTATION    AMPUTATION Right 02/18/2016   Procedure: AMPUTATION BELOW KNEE;  Surgeon: Nadara Mustard, MD;  Location: [facility] OR;  Service: Orthopedics;  Laterality: Right;   AMPUTATION Left 11/22/2016   Procedure: Amputation 4th Toe Left Foot at Metatarsophalangeal Joint;  Surgeon: Nadara Mustard, MD;  Location: [facility] OR;  Service: Orthopedics;  Laterality: Left;   ANKLE FUSION  09/27/2012   Procedure: ANKLE FUSION;  Surgeon: Nadara Mustard, MD;  Location: [facility] OR;  Service: Orthopedics;  Laterality: Left;  Left Tibiocalcaneal Fusion   ANKLE FUSION Right 05/09/2013   Procedure: ANKLE FUSION;  Surgeon: Nadara Mustard, MD;  Location[facility] OR;  Service: Orthopedics;  Laterality: Right;  Excision Osteomyelitis Base 1st MT Right Foot, Fusion Medial Column   ANTERIOR CERVICAL DECOMP/DISCECTOMY FUSION  12/18/07   For OA,  Dr. Ophelia Charter:  fu by a PE   ANTERIOR CERVICAL DECOMP/DISCECTOMY FUSION N/A 11/29/2020   Procedure: Anterior Cervical Decompression Fusion - Cervical Three-Cerivcal Four;  Surgeon: Julio Sicks, MD;  Location: [facility] OR;  Service: Neurosurgery;  Laterality: N/A;  3C   CARDIAC CATHETERIZATION  05/2010    at [facility]  TUNNEL RELEASE Left 09/2011   CATARACT EXTRACTION W/ INTRAOCULAR LENS  IMPLANT, BILATERAL  2004   feb 2004 left, aug 2004 right   CHOLECYSTECTOMY  03/2002   CORONARY ANGIOPLASTY  10/31/2013   CORONARY ARTERY BYPASS GRAFT  09/27/2016   CYST REMOVAL HAND  06/2003   FOOT BONE EXCISION Right  06/2009   LAPAROSCOPIC RIGHT HEMI COLECTOMY N/A 01/08/2015   Procedure: LAPAROSCOPIC ASSISTED RIGHT HEMI COLECTOMY;  Surgeon: Luretha Murphy, MD;  Location: [facility] ORS;  Service: General;  Laterality: N/A;   LEFT HEART CATHETERIZATION WITH CORONARY ANGIOGRAM N/A 10/31/2013   Procedure: LEFT HEART CATHETERIZATION WITH CORONARY ANGIOGRAM;  Surgeon: Corky Crafts, MD;  Location: [facility] CATH LAB;  Service: Cardiovascular;  Laterality: N/A;   NASAL SEPTUM SURGERY  09/1963   NISSEN FUNDOPLICATION  01/25/2010   PERCUTANEOUS CORONARY INTERVENTION-BALLOON ONLY  10/31/2013   Procedure: PERCUTANEOUS CORONARY INTERVENTION-BALLOON ONLY;  Surgeon: Corky Crafts, MD;  Location: [facility] CATH LAB;  Service: Cardiovascular;;   ROTATOR CUFF REPAIR Right 07/2007   Dr. Gwenith Spitz CUFF REPAIR Left 11/2004   stent in left leg, behind knee  06/27/2017   x2, done at [facility] cardiology in [facility]   TOE AMPUTATION Right 08/2008   3rd toe, Dr. Lajoyce Corners due to osteomyelitis   VAGINAL HYSTERECTOMY  03/1975   VEIN LIGATION Bilateral 03/1966   VENA CAVA FILTER PLACEMENT  01/2010   green filter; "due to blood clots"   WISDOM TOOTH EXTRACTION  06/2007   Social History   Occupational History   Occupation: Retired     Comment: from social services   Tobacco Use   Smoking status: Never   Smokeless tobacco: Never   Tobacco comments:    tried for a few months in college.   Vaping Use   Vaping status: Never Used  Substance and Sexual Activity   Alcohol use: No    Alcohol/week: 0.0 standard drinks of alcohol   Drug use: No   Sexual activity: Not Currently    Birth control/protection: Post-menopausal

## 2023-09-06 DIAGNOSIS — I10 Essential (primary) hypertension: Secondary | ICD-10-CM | POA: Diagnosis not present

## 2023-09-06 DIAGNOSIS — E785 Hyperlipidemia, unspecified: Secondary | ICD-10-CM | POA: Diagnosis not present

## 2023-09-06 DIAGNOSIS — E1152 Type 2 diabetes mellitus with diabetic peripheral angiopathy with gangrene: Secondary | ICD-10-CM | POA: Diagnosis not present

## 2023-09-06 DIAGNOSIS — I70244 Atherosclerosis of native arteries of left leg with ulceration of heel and midfoot: Secondary | ICD-10-CM | POA: Diagnosis not present

## 2023-09-12 DIAGNOSIS — I70244 Atherosclerosis of native arteries of left leg with ulceration of heel and midfoot: Secondary | ICD-10-CM | POA: Diagnosis not present

## 2023-09-12 DIAGNOSIS — E782 Mixed hyperlipidemia: Secondary | ICD-10-CM | POA: Diagnosis not present

## 2023-09-12 DIAGNOSIS — E785 Hyperlipidemia, unspecified: Secondary | ICD-10-CM | POA: Diagnosis not present

## 2023-09-12 DIAGNOSIS — E1152 Type 2 diabetes mellitus with diabetic peripheral angiopathy with gangrene: Secondary | ICD-10-CM | POA: Diagnosis not present

## 2023-09-12 DIAGNOSIS — I25728 Atherosclerosis of autologous artery coronary artery bypass graft(s) with other forms of angina pectoris: Secondary | ICD-10-CM | POA: Diagnosis not present

## 2023-09-12 DIAGNOSIS — E119 Type 2 diabetes mellitus without complications: Secondary | ICD-10-CM | POA: Diagnosis not present

## 2023-09-12 DIAGNOSIS — I10 Essential (primary) hypertension: Secondary | ICD-10-CM | POA: Diagnosis not present

## 2023-09-12 DIAGNOSIS — I70213 Atherosclerosis of native arteries of extremities with intermittent claudication, bilateral legs: Secondary | ICD-10-CM | POA: Diagnosis not present

## 2023-09-17 ENCOUNTER — Telehealth: Payer: Self-pay | Admitting: Internal Medicine

## 2023-09-17 MED ORDER — FAMOTIDINE 20 MG PO TABS: 20.0000 mg | ORAL_TABLET | Freq: Two times a day (BID) | ORAL | 1 refills | Status: DC

## 2023-09-17 NOTE — Telephone Encounter (Signed)
**  Pt is transisitioning pharmacies and needs a new script for famotidine sent over.**  Prescription Request  09/17/2023  Is this a "Controlled Substance" medicine? Yes  LOV: 06/22/2023  What is the name of the medication or equipment?   HYDROcodone-acetaminophen (NORCO) 10-325 MG tablet [[id number]]  famotidine (PEPCID) 20 MG tablet [[id number]]  Have you contacted your pharmacy to request a refill? Yes   Which pharmacy would you like this sent to?   [facility] #[id number] [place], [place] - [address] AT [facility] OF [facility] [address] & [facility] [address] [place] [address] Phone: [phone] Fax: [phone]  Patient notified that their request is being sent to the clinical staff for review and that they should receive a response within 2 business days.   Please advise at Mobile [phone] (mobile)

## 2023-09-17 NOTE — Addendum Note (Signed)
Addended byConrad Prentiss D on: 09/17/2023 02:51 PM   Modules accepted: Orders

## 2023-09-17 NOTE — Telephone Encounter (Signed)
Requesting: hydrocodone 10-325mg  Contract: 11/21/22 UDS: 11/21/22 Last Visit: 06/22/23 Next Visit: 10/22/23 Last Refill:  07/23/23 #120 and 0RF   Please Advise

## 2023-09-18 MED ORDER — HYDROCODONE-ACETAMINOPHEN 10-325 MG PO TABS: 1.0000 | ORAL_TABLET | Freq: Four times a day (QID) | ORAL | 0 refills | Status: DC | PRN

## 2023-09-18 NOTE — Telephone Encounter (Signed)
PDMP okay, Rx sent 

## 2023-09-18 NOTE — Addendum Note (Signed)
Addended by: [name] E on: 09/18/2023 11:32 AM   Modules accepted: Orders

## 2023-09-19 DIAGNOSIS — R350 Frequency of micturition: Secondary | ICD-10-CM | POA: Diagnosis not present

## 2023-09-19 DIAGNOSIS — N898 Other specified noninflammatory disorders of vagina: Secondary | ICD-10-CM | POA: Diagnosis not present

## 2023-09-19 DIAGNOSIS — E1152 Type 2 diabetes mellitus with diabetic peripheral angiopathy with gangrene: Secondary | ICD-10-CM | POA: Diagnosis not present

## 2023-09-19 DIAGNOSIS — I70213 Atherosclerosis of native arteries of extremities with intermittent claudication, bilateral legs: Secondary | ICD-10-CM | POA: Diagnosis not present

## 2023-09-19 DIAGNOSIS — I70244 Atherosclerosis of native arteries of left leg with ulceration of heel and midfoot: Secondary | ICD-10-CM | POA: Diagnosis not present

## 2023-09-19 DIAGNOSIS — E785 Hyperlipidemia, unspecified: Secondary | ICD-10-CM | POA: Diagnosis not present

## 2023-09-21 DIAGNOSIS — R350 Frequency of micturition: Secondary | ICD-10-CM | POA: Diagnosis not present

## 2023-09-24 ENCOUNTER — Ambulatory Visit (INDEPENDENT_AMBULATORY_CARE_PROVIDER_SITE_OTHER): Payer: Medicare Other | Admitting: Neurology

## 2023-09-24 ENCOUNTER — Ambulatory Visit (INDEPENDENT_AMBULATORY_CARE_PROVIDER_SITE_OTHER): Payer: Self-pay | Admitting: Neurology

## 2023-09-24 ENCOUNTER — Telehealth: Payer: Self-pay | Admitting: Neurology

## 2023-09-24 DIAGNOSIS — G5603 Carpal tunnel syndrome, bilateral upper limbs: Secondary | ICD-10-CM

## 2023-09-24 DIAGNOSIS — R200 Anesthesia of skin: Secondary | ICD-10-CM

## 2023-09-24 DIAGNOSIS — M79641 Pain in right hand: Secondary | ICD-10-CM

## 2023-09-24 DIAGNOSIS — Z0289 Encounter for other administrative examinations: Secondary | ICD-10-CM

## 2023-09-24 NOTE — Telephone Encounter (Signed)
Pt's son, Lyfe Deppe asking to keep the Nerve Conduction appt for today. Because Dr. Lajoyce Corners did not run nerve conduction test, just suggested some things she could try. Would like to keep the appt for today at 3:30 pm.

## 2023-09-24 NOTE — Telephone Encounter (Signed)
Dr Lucia Gaskins is aware

## 2023-09-24 NOTE — Progress Notes (Signed)
Full Name: [name] Gender: Female MRN #: [id number] Date of Birth: 08/12/1935    Visit Date: 09/24/2023 15:30 Age: 88 Years Examining Physician: Dr. Naomie Dean Referring Physician: Dr. Naomie Dean Height: 5 feet 4 inch  History: Joints hurt. Digits 1-3 hurt not 4,5. No elbow pain or shooting pain from the elbow or the neck.  Evaluate for carpal tunnel syndrome.  Summary: NCS performed on the bilateral upper extremities. EMG performed on the right upper extremity.  The right ulnar ADM motor nerve showed delayed distal onset latency (4.3 ms, normal less than 3.3) and reduced amplitude (4.9 mV, normal greater than 6).  The left ulnar ADM motor nerve showed delayed distal onset latency (3.7 ms, normal less than 3.3) and reduced amplitude (3.9 mV, normal greater than 6.  The right radial sensory nerve showed delayed distal peak latency (3.4 ms, normal less than 2.9) and reduced amplitude (3 V, normal greater than 15.  The left radial sensory nerve showed no response.  The right median, left median, right ulnar and left ulnar sensory nerves showed no response. All remaining nerves (as indicated in the following tables) were within normal limits.  The right biceps brachii showed prolonged motor unit duration, polyphasic motor units and diminished motor unit recruitment.  The right opponens pollicis showed increased motor unit amplitude, polyphasic motor units and diminished motor unit recruitment.All remaining muscles (as indicated in the following tables) were within normal limits.           Conclusion: No evidence for carpal tunnel syndrome.  There is sensorimotor polyneuropathy.  Chronic neurogenic changes seen in a proximal muscle likely from prior/remote cervical radiculopathy. chronic neurogenic changes seen in a distal median muscle likely from remote carpal tunnel syndrome.     ------------------------------- Naomie Dean M.D.  [facility] [address] [place], [place] [address] Tel: [phone] Fax: [phone]  Verbal informed consent was obtained from the patient, patient was informed of potential risk of procedure, including bruising, bleeding, hematoma formation, infection, muscle weakness, muscle pain, numbness, among others.        MNC    Nerve / Sites Muscle Latency Ref. Amplitude Ref. Rel Amp Segments Distance Velocity Ref. Area    ms ms mV mV %  cm m/s m/s mVms  R Median - APB     Wrist APB 4.3 <=4.4 5.8 >=4.0 100 Wrist - APB 7   17.3     Upper arm APB 9.3  8.9  155 Upper arm - Wrist 24 49 >=49 24.7  L Median - APB     Wrist APB 4.2 <=4.4 5.5 >=4.0 100 Wrist - APB 7   18.8     Upper arm APB 8.6  2.9  52.1 Upper arm - Wrist 22.6 51 >=49 8.6  R Ulnar - ADM     Wrist ADM 4.3 <=3.3 4.9 >=6.0 100 Wrist - ADM 7   15.7     B.Elbow ADM 5.6  4.7  96.5 B.Elbow - Wrist 8 62 >=49 15.6     A.Elbow ADM 9.1  4.5  96.6 A.Elbow - B.Elbow 19 54 >=49 15.7  L Ulnar - ADM     Wrist ADM 3.7 <=3.3 3.9 >=6.0 100 Wrist - ADM 7   16.2     B.Elbow ADM 6.0  3.5  91.8 B.Elbow - Wrist 12.6 54 >=49 14.3     A.Elbow ADM 9.0  3.1  86.5 A.Elbow - B.Elbow 17  58 >=49 12.8             SNC    Nerve / Sites Rec. Site Peak Lat Ref.  Amp Ref. Segments Distance    ms ms V V  cm  R Radial - Anatomical snuff box (Forearm)     Forearm Wrist 3.4 <=2.9 3 >=15 Forearm - Wrist 10  L Radial - Anatomical snuff box (Forearm)     Forearm Wrist NR <=2.9 NR >=15 Forearm - Wrist 10  R Median - Orthodromic (Dig II, Mid palm)     Dig II Wrist NR <=3.4 NR >=10 Dig II - Wrist 13  L Median - Orthodromic (Dig II, Mid palm)     Dig II Wrist NR <=3.4 NR >=10 Dig II - Wrist 13  R Ulnar - Orthodromic, (Dig V, Mid palm)     Dig V Wrist NR <=3.1 NR >=5 Dig V - Wrist 11  L Ulnar - Orthodromic, (Dig V, Mid palm)     Dig V Wrist NR <=3.1 NR >=5 Dig V - Wrist 32                 F  Wave    Nerve F Lat Ref.   ms ms  R Ulnar - ADM 31.5 <=32.0  L Ulnar - ADM 33.2  <=32.0         EMG Summary Table    Spontaneous MUAP Recruitment  Muscle IA Fib PSW Fasc Other Amp Dur. Poly Pattern  R. Cervical paraspinals (low) Normal None None None _______ Normal Normal Normal Normal  R. Deltoid Normal None None None _______ Normal Normal Normal Normal  R. Triceps brachii Normal None None None _______ Normal Normal Normal Reduced  R. Biceps brachii Normal None None None _______ Normal Increased 2+ Reduced  R. Pronator teres Normal None None None _______ Normal Normal Normal Normal  R. Extensor indicis proprius Normal None None None _______ Normal Normal Normal Normal  R. Opponens pollicis Normal None None None _______ Increased Normal Normal Reduced

## 2023-09-25 NOTE — Progress Notes (Signed)
See procedure note.

## 2023-09-26 DIAGNOSIS — E1152 Type 2 diabetes mellitus with diabetic peripheral angiopathy with gangrene: Secondary | ICD-10-CM | POA: Diagnosis not present

## 2023-09-26 DIAGNOSIS — I70244 Atherosclerosis of native arteries of left leg with ulceration of heel and midfoot: Secondary | ICD-10-CM | POA: Diagnosis not present

## 2023-09-26 DIAGNOSIS — E785 Hyperlipidemia, unspecified: Secondary | ICD-10-CM | POA: Diagnosis not present

## 2023-09-30 NOTE — Procedures (Signed)
Full Name: [name] Gender: Female MRN #: [id number] Date of Birth: 08/12/1935    Visit Date: 09/24/2023 15:30 Age: 88 Years Examining Physician: Dr. Naomie Dean Referring Physician: Dr. Naomie Dean Height: 5 feet 4 inch  History: Joints hurt. Digits 1-3 hurt not 4,5. No elbow pain or shooting pain from the elbow or the neck.  Evaluate for carpal tunnel syndrome.  Summary: NCS performed on the bilateral upper extremities. EMG performed on the right upper extremity.  The right ulnar ADM motor nerve showed delayed distal onset latency (4.3 ms, normal less than 3.3) and reduced amplitude (4.9 mV, normal greater than 6).  The left ulnar ADM motor nerve showed delayed distal onset latency (3.7 ms, normal less than 3.3) and reduced amplitude (3.9 mV, normal greater than 6.  The right radial sensory nerve showed delayed distal peak latency (3.4 ms, normal less than 2.9) and reduced amplitude (3 V, normal greater than 15.  The left radial sensory nerve showed no response.  The right median, left median, right ulnar and left ulnar sensory nerves showed no response. All remaining nerves (as indicated in the following tables) were within normal limits.  The right biceps brachii showed prolonged motor unit duration, polyphasic motor units and diminished motor unit recruitment.  The right opponens pollicis showed increased motor unit amplitude, polyphasic motor units and diminished motor unit recruitment.All remaining muscles (as indicated in the following tables) were within normal limits.           Conclusion: No evidence for carpal tunnel syndrome.  There is sensorimotor polyneuropathy.  Chronic neurogenic changes seen in a proximal muscle likely from prior/remote cervical radiculopathy. chronic neurogenic changes seen in a distal median muscle likely from remote carpal tunnel syndrome.     ------------------------------- Naomie Dean M.D.  [facility] [address] [place], [place] [address] Tel: [phone] Fax: [phone]  Verbal informed consent was obtained from the patient, patient was informed of potential risk of procedure, including bruising, bleeding, hematoma formation, infection, muscle weakness, muscle pain, numbness, among others.        MNC    Nerve / Sites Muscle Latency Ref. Amplitude Ref. Rel Amp Segments Distance Velocity Ref. Area    ms ms mV mV %  cm m/s m/s mVms  R Median - APB     Wrist APB 4.3 <=4.4 5.8 >=4.0 100 Wrist - APB 7   17.3     Upper arm APB 9.3  8.9  155 Upper arm - Wrist 24 49 >=49 24.7  L Median - APB     Wrist APB 4.2 <=4.4 5.5 >=4.0 100 Wrist - APB 7   18.8     Upper arm APB 8.6  2.9  52.1 Upper arm - Wrist 22.6 51 >=49 8.6  R Ulnar - ADM     Wrist ADM 4.3 <=3.3 4.9 >=6.0 100 Wrist - ADM 7   15.7     B.Elbow ADM 5.6  4.7  96.5 B.Elbow - Wrist 8 62 >=49 15.6     A.Elbow ADM 9.1  4.5  96.6 A.Elbow - B.Elbow 19 54 >=49 15.7  L Ulnar - ADM     Wrist ADM 3.7 <=3.3 3.9 >=6.0 100 Wrist - ADM 7   16.2     B.Elbow ADM 6.0  3.5  91.8 B.Elbow - Wrist 12.6 54 >=49 14.3     A.Elbow ADM 9.0  3.1  86.5 A.Elbow - B.Elbow 17  58 >=49 12.8             SNC    Nerve / Sites Rec. Site Peak Lat Ref.  Amp Ref. Segments Distance    ms ms V V  cm  R Radial - Anatomical snuff box (Forearm)     Forearm Wrist 3.4 <=2.9 3 >=15 Forearm - Wrist 10  L Radial - Anatomical snuff box (Forearm)     Forearm Wrist NR <=2.9 NR >=15 Forearm - Wrist 10  R Median - Orthodromic (Dig II, Mid palm)     Dig II Wrist NR <=3.4 NR >=10 Dig II - Wrist 13  L Median - Orthodromic (Dig II, Mid palm)     Dig II Wrist NR <=3.4 NR >=10 Dig II - Wrist 13  R Ulnar - Orthodromic, (Dig V, Mid palm)     Dig V Wrist NR <=3.1 NR >=5 Dig V - Wrist 11  L Ulnar - Orthodromic, (Dig V, Mid palm)     Dig V Wrist NR <=3.1 NR >=5 Dig V - Wrist 32                 F  Wave    Nerve F Lat Ref.   ms ms  R Ulnar - ADM 31.5 <=32.0  L Ulnar - ADM 33.2  <=32.0         EMG Summary Table    Spontaneous MUAP Recruitment  Muscle IA Fib PSW Fasc Other Amp Dur. Poly Pattern  R. Cervical paraspinals (low) Normal None None None _______ Normal Normal Normal Normal  R. Deltoid Normal None None None _______ Normal Normal Normal Normal  R. Triceps brachii Normal None None None _______ Normal Normal Normal Reduced  R. Biceps brachii Normal None None None _______ Normal Increased 2+ Reduced  R. Pronator teres Normal None None None _______ Normal Normal Normal Normal  R. Extensor indicis proprius Normal None None None _______ Normal Normal Normal Normal  R. Opponens pollicis Normal None None None _______ Increased Normal Normal Reduced

## 2023-10-02 ENCOUNTER — Other Ambulatory Visit: Payer: Self-pay | Admitting: Internal Medicine

## 2023-10-02 DIAGNOSIS — G43009 Migraine without aura, not intractable, without status migrainosus: Secondary | ICD-10-CM

## 2023-10-03 DIAGNOSIS — I70213 Atherosclerosis of native arteries of extremities with intermittent claudication, bilateral legs: Secondary | ICD-10-CM | POA: Diagnosis not present

## 2023-10-03 DIAGNOSIS — I70244 Atherosclerosis of native arteries of left leg with ulceration of heel and midfoot: Secondary | ICD-10-CM | POA: Diagnosis not present

## 2023-10-03 DIAGNOSIS — E1152 Type 2 diabetes mellitus with diabetic peripheral angiopathy with gangrene: Secondary | ICD-10-CM | POA: Diagnosis not present

## 2023-10-03 DIAGNOSIS — E785 Hyperlipidemia, unspecified: Secondary | ICD-10-CM | POA: Diagnosis not present

## 2023-10-04 DIAGNOSIS — H04123 Dry eye syndrome of bilateral lacrimal glands: Secondary | ICD-10-CM | POA: Diagnosis not present

## 2023-10-04 DIAGNOSIS — H40013 Open angle with borderline findings, low risk, bilateral: Secondary | ICD-10-CM | POA: Diagnosis not present

## 2023-10-04 DIAGNOSIS — H5203 Hypermetropia, bilateral: Secondary | ICD-10-CM | POA: Diagnosis not present

## 2023-10-04 DIAGNOSIS — Z961 Presence of intraocular lens: Secondary | ICD-10-CM | POA: Diagnosis not present

## 2023-10-04 LAB — HM DIABETES EYE EXAM

## 2023-10-05 ENCOUNTER — Other Ambulatory Visit: Payer: Self-pay | Admitting: Internal Medicine

## 2023-10-05 ENCOUNTER — Encounter: Payer: Self-pay | Admitting: Family

## 2023-10-05 ENCOUNTER — Other Ambulatory Visit (HOSPITAL_COMMUNITY): Payer: Self-pay

## 2023-10-05 NOTE — Telephone Encounter (Signed)
Pharmacy Patient Advocate Encounter  Received notification from [employer] that Prior Authorization for  LINZESS  has been APPROVED from 07/23/23 to 08/21/24. I talked to the patients son since the linzess prescription went to [facility] in [facility] in October and insurance is saying this prescription has [facility]. Son said he has actually not been giving the pt the medication recently and he was aware the prior Berkley Harvey was approved from the insurance.   PA #/Case ID/Reference #: [id number]

## 2023-10-10 DIAGNOSIS — I7025 Atherosclerosis of native arteries of other extremities with ulceration: Secondary | ICD-10-CM | POA: Diagnosis not present

## 2023-10-10 DIAGNOSIS — E785 Hyperlipidemia, unspecified: Secondary | ICD-10-CM | POA: Diagnosis not present

## 2023-10-10 DIAGNOSIS — E1152 Type 2 diabetes mellitus with diabetic peripheral angiopathy with gangrene: Secondary | ICD-10-CM | POA: Diagnosis not present

## 2023-10-10 DIAGNOSIS — I70244 Atherosclerosis of native arteries of left leg with ulceration of heel and midfoot: Secondary | ICD-10-CM | POA: Diagnosis not present

## 2023-10-19 ENCOUNTER — Inpatient Hospital Stay (HOSPITAL_BASED_OUTPATIENT_CLINIC_OR_DEPARTMENT_OTHER): Payer: Medicare Other | Admitting: Family

## 2023-10-19 ENCOUNTER — Inpatient Hospital Stay: Payer: Medicare Other | Attending: Hematology & Oncology

## 2023-10-19 VITALS — BP 138/58 | HR 74 | Temp 97.7°F | Resp 17 | Ht 64.0 in | Wt 156.0 lb

## 2023-10-19 DIAGNOSIS — D696 Thrombocytopenia, unspecified: Secondary | ICD-10-CM

## 2023-10-19 DIAGNOSIS — D500 Iron deficiency anemia secondary to blood loss (chronic): Secondary | ICD-10-CM | POA: Diagnosis not present

## 2023-10-19 DIAGNOSIS — D649 Anemia, unspecified: Secondary | ICD-10-CM

## 2023-10-19 DIAGNOSIS — K922 Gastrointestinal hemorrhage, unspecified: Secondary | ICD-10-CM | POA: Insufficient documentation

## 2023-10-19 DIAGNOSIS — D709 Neutropenia, unspecified: Secondary | ICD-10-CM | POA: Diagnosis not present

## 2023-10-19 DIAGNOSIS — D509 Iron deficiency anemia, unspecified: Secondary | ICD-10-CM

## 2023-10-19 LAB — CBC WITH DIFFERENTIAL (CANCER CENTER ONLY)
Abs Immature Granulocytes: 0 10*3/uL (ref 0.00–0.07)
Basophils Absolute: 0 10*3/uL (ref 0.0–0.1)
Basophils Relative: 0 %
Eosinophils Absolute: 0.1 10*3/uL (ref 0.0–0.5)
Eosinophils Relative: 2 %
HCT: 36.1 % (ref 36.0–46.0)
Hemoglobin: 10.9 g/dL — ABNORMAL LOW (ref 12.0–15.0)
Immature Granulocytes: 0 %
Lymphocytes Relative: 35 %
Lymphs Abs: 1.4 10*3/uL (ref 0.7–4.0)
MCH: 30.5 pg (ref 26.0–34.0)
MCHC: 30.2 g/dL (ref 30.0–36.0)
MCV: 101.1 fL — ABNORMAL HIGH (ref 80.0–100.0)
Monocytes Absolute: 0.3 10*3/uL (ref 0.1–1.0)
Monocytes Relative: 6 %
Neutro Abs: 2.2 10*3/uL (ref 1.7–7.7)
Neutrophils Relative %: 57 %
Platelet Count: 114 10*3/uL — ABNORMAL LOW (ref 150–400)
RBC: 3.57 MIL/uL — ABNORMAL LOW (ref 3.87–5.11)
RDW: 13.3 % (ref 11.5–15.5)
WBC Count: 3.9 10*3/uL — ABNORMAL LOW (ref 4.0–10.5)
nRBC: 0 % (ref 0.0–0.2)

## 2023-10-19 LAB — CMP (CANCER CENTER ONLY)
ALT: 9 U/L (ref 0–44)
AST: 17 U/L (ref 15–41)
Albumin: 3.9 g/dL (ref 3.5–5.0)
Alkaline Phosphatase: 36 U/L — ABNORMAL LOW (ref 38–126)
Anion gap: 6 (ref 5–15)
BUN: 23 mg/dL (ref 8–23)
CO2: 25 mmol/L (ref 22–32)
Calcium: 8 mg/dL — ABNORMAL LOW (ref 8.9–10.3)
Chloride: 108 mmol/L (ref 98–111)
Creatinine: 1.18 mg/dL — ABNORMAL HIGH (ref 0.44–1.00)
GFR, Estimated: 45 mL/min — ABNORMAL LOW (ref >60)
Glucose, Bld: 94 mg/dL (ref 70–99)
Potassium: 4.5 mmol/L (ref 3.5–5.1)
Sodium: 139 mmol/L (ref 135–145)
Total Bilirubin: 0.3 mg/dL (ref <1.2)
Total Protein: 5.9 g/dL — ABNORMAL LOW (ref 6.5–8.1)

## 2023-10-19 LAB — FOLATE: Folate: 39.8 ng/mL (ref >5.9)

## 2023-10-19 LAB — VITAMIN B12: Vitamin B-12: 605 pg/mL (ref 180–914)

## 2023-10-19 LAB — FERRITIN: Ferritin: 27 ng/mL (ref 11–307)

## 2023-10-19 NOTE — Progress Notes (Signed)
Hematology and Oncology Follow Up Visit  [name] [id number] May 06, 1935 88 y.o. 10/19/2023   Principle Diagnosis:  Iron deficiency anemia  Mild thrombocytopenia  Mild Neutropenia   Current Therapy:        IV iron as indicated    Interim History:  [name] is here today with her son for follow-up. She is doing fairly well but notes fatigue.  She has a cushioned boot on the left lower extremity to prevent pressure to the wound on her heal. She is seeing a wound specialist through her vascular surgeon and has had this wound debrided several times. She is unable to walk on it yet.   She has a right BKA with prosthetic leg.  She has hemorrhoids that will bleed with constipation or straining.  No other blood loss noted. No abnormal bruising, no petechiae.  No fever, chills, n/v, cough, rash, dizziness, SOB, chest pain, palpitations, abdominal pain or changes in bowel or bladder habits at this time.  No falls or syncope reported. She is in a wheelchair today.  Appetite and hydration are fair. She is trying to increase her food and fluid intake daily. Weight is 156 lbs.   ECOG Performance Status: 2 - Symptomatic, <50% confined to bed  Medications:  Allergies as of 10/19/2023       Reactions   Metformin And Related Diarrhea, Nausea Only, Other (See Comments)   dizzy, tired, chills   Cymbalta [duloxetine Hcl] Swelling   Swelling in legs   Gabapentin Swelling   Swelling in legs   Silicone Hives, Itching, Dermatitis, Rash   Tramadol Swelling   Adhesive [tape] Rash   Cefazolin Hives      Ciprofloxacin Other (See Comments)   body aches   Clorazepate Dipotassium Other (See Comments)   Unknown reaction   Dilaudid [hydromorphone Hcl] Itching    confused,   Doxycycline Rash   Enablex [darifenacin Hydrobromide Er] Other (See Comments)   Hypotension, near syncope   Levofloxacin Other (See Comments)   Causes wrist pain   Lyrica [pregabalin] Swelling   Swelling in legs    Methadone Hcl Other (See Comments)   Reaction unknown   Morphine And Codeine Other (See Comments)   Confusion, constipation.   Talwin [pentazocine] Other (See Comments)   "climbing walls" anxiety        Medication List        Accurate as of October 19, 2023  3:05 PM. If you have any questions, ask your nurse or doctor.          Alpha-Lipoic Acid 600 MG Caps Take 1,200 mg by mouth daily.   amitriptyline 25 MG tablet Commonly known as: ELAVIL Take 1 tablet (25 mg total) by mouth at bedtime.   atorvastatin 40 MG tablet Commonly known as: LIPITOR Take 40 mg by mouth daily.   carvedilol 12.5 MG tablet Commonly known as: COREG Take 12.5 mg by mouth 2 (two) times daily with a meal.   CINNAMON PO Take 2,000 mg by mouth daily.   clopidogrel 75 MG tablet Commonly known as: PLAVIX Take 1 tablet (75 mg total) by mouth at bedtime. Restart on 12/04/2020   Cranberry 200 MG Caps   diclofenac Sodium 1 % Gel Commonly known as: VOLTAREN Apply 4 g topically 4 (four) times daily. What changed: additional instructions   dicyclomine 10 MG capsule Commonly known as: BENTYL TAKE 1 CAPSULE BY MOUTH THREE TIMES DAILY BEFORE MEALS   donepezil 10 MG tablet Commonly known as: ARICEPT Take  1 tablet (10 mg total) by mouth at bedtime.   famotidine 20 MG tablet Commonly known as: PEPCID Take 1 tablet (20 mg total) by mouth 2 (two) times daily.   freestyle lancets CHECK BLOOD SUGAR TWICE DAILY   FREESTYLE LITE test strip Generic drug: glucose blood USE TO CHECK BLOOD SUGAR NO MORE THAN TWICE DAILY   furosemide 20 MG tablet Commonly known as: LASIX Take 20 mg by mouth daily.   HYDROcodone-acetaminophen 10-325 MG tablet Commonly known as: NORCO Take 1 tablet by mouth every 6 (six) hours as needed.   hydrocortisone 2.5 % cream APPLY TOPICALLY TO THE AFFECTED AREA TWICE DAILY   hydrocortisone 25 MG suppository Commonly known as: ANUSOL-HC Place 1 suppository (25 mg total)  rectally 2 (two) times daily. As needed   hydrocortisone-pramoxine 2.5-1 % rectal cream Commonly known as: Analpram HC Place 1 Application rectally in the morning and at bedtime. As needed   ketoconazole 2 % cream Commonly known as: NIZORAL Apply 1 Application topically 2 (two) times daily.   linaclotide 72 MCG capsule Commonly known as: Linzess Take 1 capsule (72 mcg total) by mouth daily before breakfast.   loperamide 2 MG tablet Commonly known as: IMODIUM A-D Take 2 mg by mouth as needed for diarrhea or loose stools.   MegaRed Omega-3 Krill Oil 500 MG Caps Restart on 12/04/2020   methenamine 1 g tablet Commonly known as: HIPREX Take 0.5 tablets by mouth daily.   naloxone 4 MG/0.1ML Liqd nasal spray kit Commonly known as: NARCAN Place 1 spray into the nose once as needed for up to 1 dose.   nitroGLYCERIN 0.4 MG SL tablet Commonly known as: NITROSTAT Place 1 tablet (0.4 mg total) under the tongue every 5 (five) minutes x 3 doses as needed for chest pain.   omeprazole 20 MG capsule Commonly known as: PRILOSEC Take 1 capsule (20 mg total) by mouth daily. As needed   ondansetron 4 MG tablet Commonly known as: ZOFRAN Take 1 tablet (4 mg total) by mouth every 8 (eight) hours as needed for nausea or vomiting.   OVER THE COUNTER MEDICATION 1 tablet 3 (three) times daily. Berberorine Gluco Defense   pramipexole 0.125 MG tablet Commonly known as: MIRAPEX Take 1-2 tablets (0.125-0.25 mg total) by mouth at bedtime as needed.   prenatal multivitamin Tabs tablet Take 1 tablet by mouth daily at 12 noon.   PreserVision AREDS 2 Caps Take 1 capsule by mouth 2 (two) times daily.   PROBIOTIC PEARLS PO Take 1 tablet by mouth daily.   REFRESH OP Place 1 drop into both eyes 3 (three) times daily as needed (dry eyes).   rivaroxaban 2.5 MG Tabs tablet Commonly known as: XARELTO Take 1 tablet (2.5 mg total) by mouth 2 (two) times daily. Restart on 12/04/2020   silver  sulfADIAZINE 1 % cream Commonly known as: Silvadene Apply 1 application topically daily.   Tussin DM 10-100 MG/5ML liquid Generic drug: dextromethorphan-guaiFENesin Take by mouth every 4 (four) hours as needed for cough.   Vitamin B-12 5000 MCG Subl Place 5,000 mcg under the tongue every other day.   vitamin C with rose hips 500 MG tablet Take 500 mg by mouth in the morning and at bedtime.   Vitamin D3 125 MCG (5000 UT) Caps Take 5,000 Units by mouth every evening. Reported on 11/24/2015   vitamin E 180 MG (400 UNITS) capsule Take 400 Units by mouth daily.   Zinc Sulfate 66 MG Tabs  Allergies:  Allergies  Allergen Reactions   Metformin And Related Diarrhea, Nausea Only and Other (See Comments)    dizzy, tired, chills   Cymbalta [Duloxetine Hcl] Swelling    Swelling in legs   Gabapentin Swelling    Swelling in legs   Silicone Hives, Itching, Dermatitis and Rash   Tramadol Swelling   Adhesive [Tape] Rash   Cefazolin Hives        Ciprofloxacin Other (See Comments)    body aches    Clorazepate Dipotassium Other (See Comments)    Unknown reaction   Dilaudid [Hydromorphone Hcl] Itching     confused,   Doxycycline Rash   Enablex [Darifenacin Hydrobromide Er] Other (See Comments)    Hypotension, near syncope   Levofloxacin Other (See Comments)    Causes wrist pain   Lyrica [Pregabalin] Swelling    Swelling in legs   Methadone Hcl Other (See Comments)    Reaction unknown   Morphine And Codeine Other (See Comments)    Confusion, constipation.    Talwin [Pentazocine] Other (See Comments)    "climbing walls" anxiety    Past Medical History, Surgical history, Social history, and Family History were reviewed and updated.  Review of Systems: All other 10 point review of systems is negative.   Physical Exam:  height is 5\' 4"  (1.626 m) and weight is 156 lb (70.8 kg). Her oral temperature is 97.7 F (36.5 C). Her blood pressure is 138/58 (abnormal) and her  pulse is 74. Her respiration is 17 and oxygen saturation is 100%.   Wt Readings from Last 3 Encounters:  10/19/23 156 lb (70.8 kg)  08/15/23 156 lb (70.8 kg)  08/14/23 158 lb (71.7 kg)    Ocular: Sclerae unicteric, pupils equal, round and reactive to light Ear-nose-throat: Oropharynx clear, dentition fair Lymphatic: No cervical or supraclavicular adenopathy Lungs no rales or rhonchi, good excursion bilaterally Heart regular rate and rhythm, no murmur appreciated Abd soft, nontender, positive bowel sounds MSK no focal spinal tenderness, no joint edema Neuro: non-focal, well-oriented, appropriate affect Breasts: Deferred   Lab Results  Component Value Date   WBC 3.9 (L) 10/19/2023   HGB 10.9 (L) 10/19/2023   HCT 36.1 10/19/2023   MCV 101.1 (H) 10/19/2023   PLT 114 (L) 10/19/2023   Lab Results  Component Value Date   FERRITIN 10 (L) 06/18/2023   IRON 46 06/18/2023   TIBC 350 06/18/2023   UIBC 304 06/18/2023   IRONPCTSAT 13 06/18/2023   Lab Results  Component Value Date   RETICCTPCT 1.0 06/18/2023   RBC 3.57 (L) 10/19/2023   No results found for: "KPAFRELGTCHN", "LAMBDASER", "KAPLAMBRATIO" No results found for: "IGGSERUM", "IGA", "IGMSERUM" No results found for: "TOTALPROTELP", "ALBUMINELP", "A1GS", "A2GS", "BETS", "BETA2SER", "GAMS", "MSPIKE", "SPEI"   Chemistry      Component Value Date/Time   NA 139 10/19/2023 1329   NA 140 09/03/2020 0000   K 4.5 10/19/2023 1329   CL 108 10/19/2023 1329   CO2 25 10/19/2023 1329   BUN 23 10/19/2023 1329   BUN 17 09/03/2020 0000   CREATININE 1.18 (H) 10/19/2023 1329   CREATININE 1.12 (H) 06/22/2023 1515   GLU 146 09/03/2020 0000      Component Value Date/Time   CALCIUM 8.0 (L) 10/19/2023 1329   ALKPHOS 36 (L) 10/19/2023 1329   AST 17 10/19/2023 1329   ALT 9 10/19/2023 1329   BILITOT 0.3 10/19/2023 1329       Impression and Plan: Ms. Lesieur is a very pleasant 88  yo caucasian female with iron deficiency anemia secondary  to GI blood loss and mild thrombocytopenia.   Hgb is improved at 10.9, MCV 101, platelets 114 and WBC count 3.9.  Iron studies are pending. We will replace if needed.  Follow-up in another 4 months.   Eileen Stanford, NP 12/27/20243:05 PM

## 2023-10-22 ENCOUNTER — Encounter: Payer: Self-pay | Admitting: Internal Medicine

## 2023-10-22 ENCOUNTER — Ambulatory Visit (INDEPENDENT_AMBULATORY_CARE_PROVIDER_SITE_OTHER): Payer: Medicare Other | Admitting: Internal Medicine

## 2023-10-22 VITALS — BP 128/60 | HR 75 | Temp 98.0°F | Resp 16 | Ht 64.0 in

## 2023-10-22 DIAGNOSIS — Z951 Presence of aortocoronary bypass graft: Secondary | ICD-10-CM | POA: Diagnosis not present

## 2023-10-22 DIAGNOSIS — A09 Infectious gastroenteritis and colitis, unspecified: Secondary | ICD-10-CM | POA: Diagnosis not present

## 2023-10-22 DIAGNOSIS — I10 Essential (primary) hypertension: Secondary | ICD-10-CM | POA: Diagnosis not present

## 2023-10-22 DIAGNOSIS — J310 Chronic rhinitis: Secondary | ICD-10-CM

## 2023-10-22 LAB — IRON AND IRON BINDING CAPACITY (CC-WL,HP ONLY)
Iron: 51 ug/dL (ref 28–170)
Saturation Ratios: 17 % (ref 10.4–31.8)
TIBC: 304 ug/dL (ref 250–450)
UIBC: 253 ug/dL (ref 148–442)

## 2023-10-22 NOTE — Assessment & Plan Note (Signed)
DM, diet controlled, last A1c very good. HTN: BP looks good, continue carvedilol Lasix, last BMP okay. Hyperlipidemia: Well-controlled on atorvastatin. Hypocalcemia: Chronic,  ionized calcium slightly low October 2023.  Recheck on RTC. CAD: Asymptomatic, did report chest pain felt to be chest wall pain, see physical exam, rec observation. Saw cardiology 09/12/2023 in [place], note not available. Recurrent UTIs: Saw urology 08/10/2023 for possible UTI, Macrobid was prescribed. Iron deficiency anemia: Saw hematology 3 days ago, replace iron prn Rhinitis: Reports some discomfort in the nostrils, on exam no active bleeding, the membranes are very dry.  Recommend to use Vaseline at nighttime. Paresthesia, hands: Saw neurology,NCS September 24, 2023: No evidence of CTS.  Subsequently saw Ortho, Dx with DJD. Diarrhea: Again reports on and off diarrhea, noting she has is not  using Linzess.  Recommend Metamucil.  See instructions.  Reach out to GI if further advice is needed Preventive care:  had a flu shot and RSV October 2024. RTC 4 months.

## 2023-10-22 NOTE — Patient Instructions (Signed)
It was good to see you today.  Please come back in 4 months.  For diarrhea, start Metamucil 1 capsule daily, you can gradually increase to up to 3 capsules daily if you feel it is helping.  For dry nose, you can use Vaseline or a similar product.

## 2023-10-22 NOTE — Progress Notes (Signed)
Subjective:    Patient ID: [name], female    DOB: [birth year], 88 y.o.   MRN: [id number]  DOS:  10/22/2023 Type of visit - description: f/u here w/ her son  No new concerns. Still has on and off diarrhea, occasionally bright blood per rectum felt to be from hemorrhoids. Denies fever or chills. Has occasional nausea but is actually better than before.   Saw cardiology.  No changes recommended.  As I examined her, she reported pain at the left side of the chest where I placed  my stethoscope. No recent falls or chest injury to account for the pain, no bruising.  No fever or chills.  Review of Systems See above   Past Medical History:  Diagnosis Date   Allergic rhinitis    Arthritis    "back; ankles; hands; knees" (10/31/2013)   Bilateral sensorineural hearing loss    CAD (coronary artery disease)    a. Nonobst in 2011. b. Abnormal nuc 09/2013 -> s/p cutting balloon to D2, mild LAD disease; c. 08/2015 MV: no ischemia, EF 71%.   Carcinoma in situ in a polyp 1994   a. 1994 - malignant polyp removed during colonoscopy.   Chronic diastolic CHF (congestive heart failure) (HCC)    a. 09/2013 Echo: EF 60-65%, mild LVH, Gr1 DD.   Dementia (HCC)    Diverticulosis    DJD (degenerative joint disease)    Fatty liver    GERD (gastroesophageal reflux disease)    a. Hx GERD/esophageal dysmotility followed by Dr. Juanda Chance.    Hemorrhoids    Hiatal hernia    a. s/p Nissen fundoplication 2011.   Hyperlipidemia    a. patient unwilling to use statins.   Hypertensive heart disease    IBS (irritable bowel syndrome)    Macular degeneration 03/2009   Dr. Earlene Plater   Neuropathy    a. Hands, feet, legs.   Orthostatic hypotension    Osteomyelitis (HCC)    a. 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 01/2016 s/p RLE transtibial amputation 2/2 Charcot rocker-bottom deformity and insensate neuropathy ulceration.   Osteoporosis    PE  (pulmonary embolism) 12/2007   a. PE/DVT after neck surgery 2009. b. coumadin d/c 10-2008.   PONV (postoperative nausea and vomiting) 2009   neck surgery   PPD positive    Recurrent UTI    RSD (reflex sympathetic dystrophy)    a. Chronic pain.   Spinal stenosis    Type II diabetes mellitus (HCC)    no on medication   Venous insufficiency    a. Contributing to LEE.   Wound of left foot     Past Surgical History:  Procedure Laterality Date   AMPUTATION  03/06/2012   Procedure: AMPUTATION FOOT;  Surgeon: Nadara Mustard, MD;  Location: [facility] OR;  Service: Orthopedics;  Laterality: Left;  FIFTH RAY AMPUTATION    AMPUTATION Right 02/18/2016   Procedure: AMPUTATION BELOW KNEE;  Surgeon: Nadara Mustard, MD;  Location: [facility] OR;  Service: Orthopedics;  Laterality: Right;   AMPUTATION Left 11/22/2016   Procedure: Amputation 4th Toe Left Foot at Metatarsophalangeal Joint;  Surgeon: Nadara Mustard, MD;  Location: [facility] OR;  Service: Orthopedics;  Laterality: Left;   ANKLE FUSION  09/27/2012   Procedure: ANKLE FUSION;  Surgeon: Nadara Mustard, MD;  Location: [facility] OR;  Service: Orthopedics;  Laterality: Left;  Left Tibiocalcaneal Fusion   ANKLE FUSION Right 05/09/2013   Procedure: ANKLE  FUSION;  Surgeon: Nadara Mustard, MD;  Location: [facility] OR;  Service: Orthopedics;  Laterality: Right;  Excision Osteomyelitis Base 1st MT Right Foot, Fusion Medial Column   ANTERIOR CERVICAL DECOMP/DISCECTOMY FUSION  12/18/07   For OA,  Dr. Ophelia Charter:  fu by a PE   ANTERIOR CERVICAL DECOMP/DISCECTOMY FUSION N/A 11/29/2020   Procedure: Anterior Cervical Decompression Fusion - Cervical Three-Cerivcal Four;  Surgeon: Julio Sicks, MD;  Location: [facility] OR;  Service: Neurosurgery;  Laterality: N/A;  3C   CARDIAC CATHETERIZATION  05/2010    at [facility] TUNNEL RELEASE Left 09/2011   CATARACT EXTRACTION W/ INTRAOCULAR LENS  IMPLANT, BILATERAL  2004   feb 2004 left, aug 2004 right   CHOLECYSTECTOMY  03/2002   CORONARY ANGIOPLASTY  10/31/2013    CORONARY ARTERY BYPASS GRAFT  09/27/2016   CYST REMOVAL HAND  06/2003   FOOT BONE EXCISION Right 06/2009   LAPAROSCOPIC RIGHT HEMI COLECTOMY N/A 01/08/2015   Procedure: LAPAROSCOPIC ASSISTED RIGHT HEMI COLECTOMY;  Surgeon: Luretha Murphy, MD;  Location: [facility] ORS;  Service: General;  Laterality: N/A;   LEFT HEART CATHETERIZATION WITH CORONARY ANGIOGRAM N/A 10/31/2013   Procedure: LEFT HEART CATHETERIZATION WITH CORONARY ANGIOGRAM;  Surgeon: Corky Crafts, MD;  Location: [facility] CATH LAB;  Service: Cardiovascular;  Laterality: N/A;   NASAL SEPTUM SURGERY  09/1963   NISSEN FUNDOPLICATION  01/25/2010   PERCUTANEOUS CORONARY INTERVENTION-BALLOON ONLY  10/31/2013   Procedure: PERCUTANEOUS CORONARY INTERVENTION-BALLOON ONLY;  Surgeon: Corky Crafts, MD;  Location: [facility] CATH LAB;  Service: Cardiovascular;;   ROTATOR CUFF REPAIR Right 07/2007   Dr. Gwenith Spitz CUFF REPAIR Left 11/2004   stent in left leg, behind knee  06/27/2017   x2, done at [facility] cardiology in [facility]   TOE AMPUTATION Right 08/2008   3rd toe, Dr. Lajoyce Corners due to osteomyelitis   VAGINAL HYSTERECTOMY  03/1975   VEIN LIGATION Bilateral 03/1966   VENA CAVA FILTER PLACEMENT  01/2010   green filter; "due to blood clots"   WISDOM TOOTH EXTRACTION  06/2007    Current Outpatient Medications  Medication Instructions   Alpha-Lipoic Acid 1,200 mg, Daily   amitriptyline (ELAVIL) 25 mg, Oral, Daily at bedtime   atorvastatin (LIPITOR) 40 mg, Daily   carvedilol (COREG) 12.5 mg, 2 times daily with meals   CINNAMON PO 2,000 mg, Daily   clopidogrel (PLAVIX) 75 mg, Oral, Daily at bedtime, Restart on 12/04/2020   Cranberry 200 MG CAPS    dextromethorphan-guaiFENesin (TUSSIN DM) 10-100 MG/5ML liquid Every 4 hours PRN   diclofenac Sodium (VOLTAREN) 4 g, Topical, 4 times daily   dicyclomine (BENTYL) 10 MG capsule TAKE 1 CAPSULE BY MOUTH THREE TIMES DAILY BEFORE MEALS   donepezil (ARICEPT) 10 mg, Oral, Daily at bedtime   famotidine (PEPCID) 20  mg, Oral, 2 times daily   furosemide (LASIX) 20 mg, Daily   glucose blood (FREESTYLE LITE) test strip USE TO CHECK BLOOD SUGAR NO MORE THAN TWICE DAILY   HYDROcodone-acetaminophen (NORCO) 10-325 MG tablet 1 tablet, Oral, Every 6 hours PRN   hydrocortisone (ANUSOL-HC) 25 mg, Rectal, 2 times daily, As needed   hydrocortisone 2.5 % cream APPLY TOPICALLY TO THE AFFECTED AREA TWICE DAILY   hydrocortisone-pramoxine (ANALPRAM HC) 2.5-1 % rectal cream 1 Application, Rectal, 2 times daily, As needed   ketoconazole (NIZORAL) 2 % cream 1 Application, Topical, 2 times daily   Lancets (FREESTYLE) lancets CHECK BLOOD SUGAR TWICE DAILY   linaclotide (LINZESS) 72 mcg, Oral, Daily  before breakfast   loperamide (IMODIUM A-D) 2 mg, As needed   MegaRed Omega-3 Krill Oil 500 MG CAPS Restart on 12/04/2020   methenamine (HIPREX) 1 g tablet 0.5 tablets, Daily   Multiple Vitamins-Minerals (PRESERVISION AREDS 2) CAPS 1 capsule, 2 times daily   naloxone (NARCAN) nasal spray 4 mg/0.1 mL 1 spray, Nasal, Once PRN   nitroGLYCERIN (NITROSTAT) 0.4 mg, Sublingual, Every 5 min x3 PRN   omeprazole (PRILOSEC) 20 mg, Oral, Daily, As needed   ondansetron (ZOFRAN) 4 mg, Oral, Every 8 hours PRN   OVER THE COUNTER MEDICATION 1 tablet, 3 times daily   Polyvinyl Alcohol-Povidone (REFRESH OP) 1 drop, 3 times daily PRN   pramipexole (MIRAPEX) 0.125-0.25 mg, Oral, At bedtime PRN   Prenatal Vit-Fe Fumarate-FA (PRENATAL MULTIVITAMIN) TABS tablet 1 tablet, Daily   Probiotic Product (PROBIOTIC PEARLS PO) 1 tablet, Daily   rivaroxaban (XARELTO) 2.5 mg, Oral, 2 times daily, Restart on 12/04/2020   silver sulfADIAZINE (SILVADENE) 1 % cream 1 application , Topical, Daily   Vitamin B-12 5,000 mcg, Every other day   vitamin C with rose hips 500 mg, 2 times daily   Vitamin D3 5,000 Units, Every evening   vitamin E 400 Units, Daily   Zinc Sulfate 66 MG TABS        Objective:   Physical Exam BP 128/60   Pulse 75   Temp 98 F (36.7 C)  (Oral)   Resp 16   Ht 5\' 4"  (1.626 m)   SpO2 99%   BMI 26.78 kg/m  General:   Well developed, NAD, BMI noted. HEENT:  Normocephalic . Face symmetric, atraumatic Lungs:  CTA B Normal respiratory effort, no intercostal retractions, no accessory muscle use. Heart: RRR,  no murmur. Chest wall: On inspection no bruising. Slightly TTP at the left anterior and  right anterior  side of the chest wall.  No crepitus. Skin: Not pale. Not jaundice Neurologic:  alert & oriented X3.  Speech normal, gait: Not tested Psych--  Seems to be in good spirits Behavior appropriate. No anxious or depressed appearing.      Assessment     ASSESSMENT DM, Neuropathy, amputations due to Charcot feet. - metformin intolerant  (lethargic) HTN  Hyperlipidemia CV: ---CAD cardiologist in [facility] --NSTEMI 09-2016. Outside hospital: Cath 2 vessel disease, CABG 2, LIMA to LAD and Diag   ---Chronic diastolic CHF ---PVD: Multiple procedures per Dr Docia Barrier, [facility]) ---May 2023: Cardiology rec continue Plavix and Xarelto, stop aspirin. ---  IVC filter seen on CXR  09/23/2020 GI: GERD, IBS, colon polyps, PUD, abdominal pain "post polypectomy syndrome" naueas meds prn (antivert-zofran, gets nausea when car traveling) Osteoporosis  MSK,pain mngmt : --Reflex sympathetic dystrophy --Neuropathy --DJD --Spinal stenosis  --H/o Osteomyelitis  Foot --s/p amputation:  4th 5th L toes , R BKA  (01-2016, dx osteomyelitis) w/ phantom pain  -- sees Dr Lajoyce Corners prn She took oxycodone after surgery and that did NOT seem to help better than hydrocodone. Intolerant to Cymbalta, Neurontin ; Lyrica helped but caused edema. Recurrent UTIs: Dr Mena Goes  Venous insufficiency Dementia: MMSE 23 (January 2023) H/o  + PPD H/o hypothyroidism: TSHs stable H/o dyspnea  LUNG MASS -incidental per CT 2009,  PET scan 01-2008: likely  benign, CT 08-2009 no change, no further CTs per Dr Shelle Iron - Had a CT in [place] 09-2016  "spiculated mass", saw Dr Delton Coombes,  CT  09/2017 stable   PLAN:  DM, diet controlled, last A1c very good. HTN: BP looks good, continue  carvedilol Lasix, last BMP okay. Hyperlipidemia: Well-controlled on atorvastatin. Hypocalcemia: Chronic,  ionized calcium slightly low October 2023.  Recheck on RTC. CAD: Asymptomatic, did report chest pain felt to be chest wall pain, see physical exam, rec observation. Saw cardiology 09/12/2023 in [place], note not available. Recurrent UTIs: Saw urology 08/10/2023 for possible UTI, Macrobid was prescribed. Iron deficiency anemia: Saw hematology 3 days ago, replace iron prn Rhinitis: Reports some discomfort in the nostrils, on exam no active bleeding, the membranes are very dry.  Recommend to use Vaseline at nighttime. Paresthesia, hands: Saw neurology,NCS September 24, 2023: No evidence of CTS.  Subsequently saw Ortho, Dx with DJD. Diarrhea: Again reports on and off diarrhea, noting she has is not  using Linzess.  Recommend Metamucil.  See instructions.  Reach out to GI if further advice is needed Preventive care:  had a flu shot and RSV October 2024. RTC 4 months.

## 2023-10-25 DIAGNOSIS — I70244 Atherosclerosis of native arteries of left leg with ulceration of heel and midfoot: Secondary | ICD-10-CM | POA: Diagnosis not present

## 2023-10-25 DIAGNOSIS — I10 Essential (primary) hypertension: Secondary | ICD-10-CM | POA: Diagnosis not present

## 2023-10-25 DIAGNOSIS — E785 Hyperlipidemia, unspecified: Secondary | ICD-10-CM | POA: Diagnosis not present

## 2023-10-25 DIAGNOSIS — E1152 Type 2 diabetes mellitus with diabetic peripheral angiopathy with gangrene: Secondary | ICD-10-CM | POA: Diagnosis not present

## 2023-10-29 ENCOUNTER — Encounter (HOSPITAL_COMMUNITY): Payer: Self-pay

## 2023-10-29 ENCOUNTER — Other Ambulatory Visit: Payer: Self-pay

## 2023-10-29 ENCOUNTER — Emergency Department (HOSPITAL_COMMUNITY)
Admission: EM | Admit: 2023-10-29 | Discharge: 2023-10-29 | Disposition: A | Discharge status: Home / Self Care | Payer: Medicare Other | Attending: Emergency Medicine | Admitting: Emergency Medicine

## 2023-10-29 DIAGNOSIS — R197 Diarrhea, unspecified: Secondary | ICD-10-CM | POA: Diagnosis not present

## 2023-10-29 DIAGNOSIS — R109 Unspecified abdominal pain: Secondary | ICD-10-CM

## 2023-10-29 DIAGNOSIS — R7989 Other specified abnormal findings of blood chemistry: Secondary | ICD-10-CM | POA: Insufficient documentation

## 2023-10-29 DIAGNOSIS — R1032 Left lower quadrant pain: Secondary | ICD-10-CM | POA: Diagnosis not present

## 2023-10-29 DIAGNOSIS — N3020 Other chronic cystitis without hematuria: Secondary | ICD-10-CM | POA: Diagnosis not present

## 2023-10-29 DIAGNOSIS — R8271 Bacteriuria: Secondary | ICD-10-CM | POA: Diagnosis not present

## 2023-10-29 DIAGNOSIS — R1030 Lower abdominal pain, unspecified: Secondary | ICD-10-CM | POA: Diagnosis not present

## 2023-10-29 DIAGNOSIS — Z7902 Long term (current) use of antithrombotics/antiplatelets: Secondary | ICD-10-CM | POA: Insufficient documentation

## 2023-10-29 LAB — URINALYSIS, ROUTINE W REFLEX MICROSCOPIC
Bilirubin Urine: NEGATIVE
Glucose, UA: NEGATIVE mg/dL
Hgb urine dipstick: NEGATIVE
Ketones, ur: NEGATIVE mg/dL
Leukocytes,Ua: NEGATIVE
Nitrite: NEGATIVE
Protein, ur: NEGATIVE mg/dL
Specific Gravity, Urine: 1 — ABNORMAL LOW (ref 1.005–1.030)
pH: 6 (ref 5.0–8.0)

## 2023-10-29 LAB — LIPASE, BLOOD: Lipase: 28 U/L (ref 11–51)

## 2023-10-29 LAB — CBC
HCT: 35.3 % — ABNORMAL LOW (ref 36.0–46.0)
Hemoglobin: 10.8 g/dL — ABNORMAL LOW (ref 12.0–15.0)
MCH: 30.3 pg (ref 26.0–34.0)
MCHC: 30.6 g/dL (ref 30.0–36.0)
MCV: 98.9 fL (ref 80.0–100.0)
Platelets: 135 10*3/uL — ABNORMAL LOW (ref 150–400)
RBC: 3.57 MIL/uL — ABNORMAL LOW (ref 3.87–5.11)
RDW: 13.7 % (ref 11.5–15.5)
WBC: 4 10*3/uL (ref 4.0–10.5)
nRBC: 0 % (ref 0.0–0.2)

## 2023-10-29 LAB — COMPREHENSIVE METABOLIC PANEL
ALT: 12 U/L (ref 0–44)
AST: 17 U/L (ref 15–41)
Albumin: 3.3 g/dL — ABNORMAL LOW (ref 3.5–5.0)
Alkaline Phosphatase: 37 U/L — ABNORMAL LOW (ref 38–126)
Anion gap: 7 (ref 5–15)
BUN: 24 mg/dL — ABNORMAL HIGH (ref 8–23)
CO2: 24 mmol/L (ref 22–32)
Calcium: 9.4 mg/dL (ref 8.9–10.3)
Chloride: 109 mmol/L (ref 98–111)
Creatinine, Ser: 1.16 mg/dL — ABNORMAL HIGH (ref 0.44–1.00)
GFR, Estimated: 46 mL/min — ABNORMAL LOW (ref >60)
Glucose, Bld: 89 mg/dL (ref 70–99)
Potassium: 4.3 mmol/L (ref 3.5–5.1)
Sodium: 140 mmol/L (ref 135–145)
Total Bilirubin: 0.8 mg/dL (ref 0.0–1.2)
Total Protein: 6 g/dL — ABNORMAL LOW (ref 6.5–8.1)

## 2023-10-29 NOTE — ED Notes (Signed)
 Urologist office called and stated Dr Dwain Sarna from urology though pt had a perforated bowel, that was before the ct was actually read. The office stated the radiologist read the ct scan and states there is not a perforated bowel.

## 2023-10-29 NOTE — Discharge Instructions (Addendum)
 We spoke with Dr. McKenzie/urology who reviewed the images and there are no concerning findings.  You are stable to follow-up in the office. Use Tylenol as needed for pain.

## 2023-10-29 NOTE — ED Provider Notes (Signed)
 [place] EMERGENCY DEPARTMENT AT [facility] Provider Note   CSN: [id number] Arrival date & time: 10/29/23  1750     History  No chief complaint on file.   MYRIKAL MESSMER is a 89 y.o. female.  Patient presented for possible bowel perforation that was seen on CT.  Per family report urologist looked at it and was currently concerned.  Family was told later on radiology read the CT and said it was okay.  Patient's had no fevers, no vomiting.  Patient's had chronic left lower abdominal pain intermittent and chronic diarrhea.  This is similar to previous.  The history is provided by the patient.       Home Medications Prior to Admission medications   Medication Sig Start Date End Date Taking? Authorizing Provider  Alpha-Lipoic Acid 600 MG CAPS Take 1,200 mg by mouth daily.    [provider]  amitriptyline  (ELAVIL ) 25 MG tablet Take 1 tablet (25 mg total) by mouth at bedtime. 10/03/23   Amon Aloysius BRAVO, MD  Ascorbic Acid  (VITAMIN C WITH ROSE HIPS) 500 MG tablet Take 500 mg by mouth in the morning and at bedtime.    [provider]  atorvastatin  (LIPITOR) 40 MG tablet Take 40 mg by mouth daily. 12/01/18   [provider]  carvedilol  (COREG ) 12.5 MG tablet Take 12.5 mg by mouth 2 (two) times daily with a meal.    [provider]  Cholecalciferol  (VITAMIN D3) 5000 UNITS CAPS Take 5,000 Units by mouth every evening. Reported on 11/24/2015    [provider]  CINNAMON PO Take 2,000 mg by mouth daily.    [provider]  clopidogrel  (PLAVIX ) 75 MG tablet Take 1 tablet (75 mg total) by mouth at bedtime. Restart on 12/04/2020 11/30/20   Bergman, Meghan D, NP  Cranberry 200 MG CAPS     [provider]  Cyanocobalamin  (VITAMIN B-12) 5000 MCG SUBL Place 5,000 mcg under the tongue every other day.    [provider]  dextromethorphan-guaiFENesin (TUSSIN DM) 10-100 MG/5ML liquid Take by mouth every 4 (four) hours as needed for  cough.    [provider]  diclofenac  Sodium (VOLTAREN ) 1 % GEL Apply 4 g topically 4 (four) times daily. Patient taking differently: Apply 4 g topically 4 (four) times daily. If needed for pain 09/02/21   Emil Share, DO  dicyclomine  (BENTYL ) 10 MG capsule TAKE 1 CAPSULE BY MOUTH THREE TIMES DAILY BEFORE MEALS 06/11/23   Nandigam, Kavitha V, MD  donepezil  (ARICEPT ) 10 MG tablet Take 1 tablet (10 mg total) by mouth at bedtime. 08/29/23   Amon Aloysius BRAVO, MD  famotidine  (PEPCID ) 20 MG tablet Take 1 tablet (20 mg total) by mouth 2 (two) times daily. 09/17/23   Amon Aloysius BRAVO, MD  furosemide  (LASIX ) 20 MG tablet Take 20 mg by mouth daily. 11/09/16   [provider]  glucose blood (FREESTYLE LITE) test strip USE TO CHECK BLOOD SUGAR NO MORE THAN TWICE DAILY 12/04/19   Paz, Jose E, MD  HYDROcodone -acetaminophen  (NORCO) 10-325 MG tablet Take 1 tablet by mouth every 6 (six) hours as needed. 09/18/23   Amon Aloysius BRAVO, MD  hydrocortisone  (ANUSOL -HC) 25 MG suppository Place 1 suppository (25 mg total) rectally 2 (two) times daily. As needed 08/14/23   Nandigam, Kavitha V, MD  hydrocortisone  2.5 % cream APPLY TOPICALLY TO THE AFFECTED AREA TWICE DAILY 01/18/23   Nandigam, Kavitha V, MD  hydrocortisone -pramoxine (ANALPRAM HC) 2.5-1 % rectal cream Place 1  Application rectally in the morning and at bedtime. As needed 08/14/23   Nandigam, Kavitha V, MD  ketoconazole  (NIZORAL ) 2 % cream Apply 1 Application topically 2 (two) times daily. 03/30/23   Paz, Jose E, MD  Lancets (FREESTYLE) lancets CHECK BLOOD SUGAR TWICE DAILY 04/08/19   Amon Aloysius BRAVO, MD  linaclotide  (LINZESS ) 72 MCG capsule Take 1 capsule (72 mcg total) by mouth daily before breakfast. Patient not taking: Reported on 10/22/2023 08/14/23   Nandigam, Kavitha V, MD  loperamide  (IMODIUM  A-D) 2 MG tablet Take 2 mg by mouth as needed for diarrhea or loose stools.    [provider]  MegaRed Omega-3 Krill Oil 500 MG CAPS Restart on 12/04/2020 11/30/20    Bergman, Meghan D, NP  methenamine  (HIPREX ) 1 g tablet Take 0.5 tablets by mouth daily. 07/30/18   [provider]  Multiple Vitamins-Minerals (PRESERVISION AREDS 2) CAPS Take 1 capsule by mouth 2 (two) times daily.    [provider]  naloxone  (NARCAN ) nasal spray 4 mg/0.1 mL Place 1 spray into the nose once as needed for up to 1 dose. 10/06/20   Amon Aloysius BRAVO, MD  nitroGLYCERIN  (NITROSTAT ) 0.4 MG SL tablet Place 1 tablet (0.4 mg total) under the tongue every 5 (five) minutes x 3 doses as needed for chest pain. Patient not taking: Reported on 10/22/2023 12/24/19   Amon Aloysius BRAVO, MD  omeprazole  (PRILOSEC) 20 MG capsule Take 1 capsule (20 mg total) by mouth daily. As needed 08/14/23   Nandigam, Kavitha V, MD  ondansetron  (ZOFRAN ) 4 MG tablet Take 1 tablet (4 mg total) by mouth every 8 (eight) hours as needed for nausea or vomiting. Patient not taking: Reported on 10/22/2023 04/18/23   Paz, Jose E, MD  OVER THE COUNTER MEDICATION 1 tablet 3 (three) times daily. [employer] Gluc[employer]    [provider]  Polyvinyl Alcohol -Povidone (REFRESH OP) Place 1 drop into both eyes 3 (three) times daily as needed (dry eyes).     [provider]  pramipexole  (MIRAPEX ) 0.125 MG tablet Take 1-2 tablets (0.125-0.25 mg total) by mouth at bedtime as needed. 10/05/23   Paz, Jose E, MD  Prenatal Vit-Fe Fumarate-FA (PRENATAL MULTIVITAMIN) TABS tablet Take 1 tablet by mouth daily at 12 noon.    [provider]  Probiotic Product (PROBIOTIC PEARLS PO) Take 1 tablet by mouth daily.    [provider]  rivaroxaban  (XARELTO ) 2.5 MG TABS tablet Take 1 tablet (2.5 mg total) by mouth 2 (two) times daily. Restart on 12/04/2020 11/30/20   Bergman, Meghan D, NP  silver  sulfADIAZINE  (SILVADENE ) 1 % cream Apply 1 application topically daily. Patient not taking: Reported on 10/22/2023 11/29/21   Valdemar Rocky SAUNDERS, NP  vitamin E  180 MG (400 UNITS) capsule Take 400 Units by mouth daily.     [provider]  Zinc Sulfate 66 MG TABS     [provider]      Allergies    Metformin  and related, Cymbalta [duloxetine hcl], Gabapentin, Silicone, Tramadol, Adhesive [tape], Cefazolin, Ciprofloxacin , Clorazepate dipotassium, Dilaudid  [hydromorphone  hcl], Doxycycline, Enablex [darifenacin hydrobromide er], Levofloxacin, Lyrica [pregabalin], Methadone hcl, Morphine  and codeine, and Talwin [pentazocine]    Review of Systems   Review of Systems  Constitutional:  Negative for chills and fever.  HENT:  Negative for congestion.   Eyes:  Negative for visual disturbance.  Respiratory:  Negative for shortness of breath.   Cardiovascular:  Negative for chest pain.  Gastrointestinal:  Positive for abdominal pain. Negative for  vomiting.  Genitourinary:  Positive for dysuria. Negative for flank pain.  Musculoskeletal:  Negative for back pain, neck pain and neck stiffness.  Skin:  Negative for rash.  Neurological:  Negative for light-headedness and headaches.    Physical Exam Updated Vital Signs BP (!) 150/67 (BP Location: Right Arm)   Pulse 73   Temp 98 F (36.7 C) (Oral)   Resp 18   Ht 5' 4 (1.626 m)   Wt 70.8 kg   SpO2 100%   BMI 26.79 kg/m  Physical Exam Vitals and nursing note reviewed.  Constitutional:      General: She is not in acute distress.    Appearance: She is well-developed.  HENT:     Head: Normocephalic and atraumatic.     Mouth/Throat:     Mouth: Mucous membranes are moist.  Eyes:     General:        Right eye: No discharge.        Left eye: No discharge.     Conjunctiva/sclera: Conjunctivae normal.  Neck:     Trachea: No tracheal deviation.  Cardiovascular:     Rate and Rhythm: Normal rate and regular rhythm.     Heart sounds: No murmur heard. Pulmonary:     Effort: Pulmonary effort is normal.     Breath sounds: Normal breath sounds.  Abdominal:     General: There is no distension.     Palpations: Abdomen is soft.     Tenderness:  There is no abdominal tenderness. There is no guarding.  Musculoskeletal:        General: Normal range of motion.     Cervical back: Normal range of motion and neck supple. No rigidity.  Skin:    General: Skin is warm.     Capillary Refill: Capillary refill takes less than 2 seconds.     Findings: No rash.  Neurological:     General: No focal deficit present.     Mental Status: She is alert.     Cranial Nerves: No cranial nerve deficit.  Psychiatric:        Mood and Affect: Mood normal.     ED Results / Procedures / Treatments   Labs (all labs ordered are listed, but only abnormal results are displayed) Labs Reviewed  COMPREHENSIVE METABOLIC PANEL - Abnormal; Notable for the following components:      Result Value   BUN 24 (*)    Creatinine, Ser 1.16 (*)    Total Protein 6.0 (*)    Albumin 3.3 (*)    Alkaline Phosphatase 37 (*)    GFR, Estimated 46 (*)    All other components within normal limits  CBC - Abnormal; Notable for the following components:   RBC 3.57 (*)    Hemoglobin 10.8 (*)    HCT 35.3 (*)    Platelets 135 (*)    All other components within normal limits  URINALYSIS, ROUTINE W REFLEX MICROSCOPIC - Abnormal; Notable for the following components:   Color, Urine COLORLESS (*)    Specific Gravity, Urine 1.000 (*)    All other components within normal limits  LIPASE, BLOOD    EKG None  Radiology No results found.  Procedures Procedures    Medications Ordered in ED Medications - No data to display  ED Course/ Medical Decision Making/ A&P  Medical Decision Making Amount and/or Complexity of Data Reviewed Labs: ordered.   Patient presents for possible bowel perforation or abnormal CT scan.  Patient had blood work ordered and reviewed independently by myself no significant leukocytosis, electrolytes unremarkable, minimal elevated creatinine 1.1 and Hb 10.  Patient admits this pain is similar to the past 3 to 4  weeks, likely chronic in nature.  Urinalysis reviewed no signs of significant infection.  Unable to see CT scan from epic.  Discussed with Dr. Sherrilee on-call urology who reviewed the images independently and the report and said there was no acute findings and patient stable for discharge.  Updated patient on plan of care.  Patient well-appearing in the ER, afebrile, no significant abdominal pain or tenderness.        Final Clinical Impression(s) / ED Diagnoses Final diagnoses:  Acute abdominal pain    Rx / DC Orders ED Discharge Orders     None         Tonia Chew, MD 10/29/23 2311

## 2023-10-29 NOTE — ED Provider Triage Note (Signed)
 Emergency Medicine Provider Triage Evaluation Note  MAKYNLEE KRESSIN , a 89 y.o. female  was evaluated in triage.  Pt complains of possible bowel perforation found on CT of the abdomen, and was sent here. However, radiology has since called and said they do not believe it was perforated. Previous Hx of bowel perforation.  Endorses painful urination.   Review of Systems  Positive: Chronic left lower quadrant pain, chronic diarrhea, unchanged, dysuria.  Negative: Fever, chest pain, shortness, n/v, fatigue,   Physical Exam  BP (!) 155/78 (BP Location: Left Arm)   Pulse 67   Temp 97.7 F (36.5 C)   Resp 17   Ht 5' 4 (1.626 m)   Wt 70.8 kg   SpO2 100%   BMI 26.79 kg/m  Gen:   Awake, no distress   Resp:  Normal effort  MSK:   Moves extremities without difficulty  Other:    Medical Decision Making  Medically screening exam initiated at 7:09 PM.  Appropriate orders placed.  PRITIKA ALVAREZ was informed that the remainder of the evaluation will be completed by another provider, this initial triage assessment does not replace that evaluation, and the importance of remaining in the ED until their evaluation is complete.     Beola Terrall RAMAN, [place] 10/29/23 1918

## 2023-10-29 NOTE — ED Triage Notes (Signed)
 Pt sent by Urology and had CT and it showed a perforated bowel. Pt c/o LLQ abdominal painx3wks. Pt c/o int diarrheax3wks. Pt denies N/V.

## 2023-10-30 ENCOUNTER — Telehealth: Payer: Self-pay | Admitting: *Deleted

## 2023-10-30 ENCOUNTER — Encounter: Payer: Self-pay | Admitting: Gastroenterology

## 2023-10-30 NOTE — Transitions of Care (Post Inpatient/ED Visit) (Signed)
   10/30/2023  Name: [name] MRN: [id number] DOB: Nov 20, 1934  Today's TOC FU Call Status: Today's TOC FU Call Status:: Unsuccessful Call (1st Attempt) Unsuccessful Call (1st Attempt) Date: 10/30/23  Attempted to reach the patient regarding the most recent Inpatient/ED visit.  Follow Up Plan: Additional outreach attempts will be made to reach the patient to complete the Transitions of Care (Post Inpatient/ED visit) call.   Signature Tristian Sickinger, [facility]

## 2023-10-31 NOTE — Telephone Encounter (Signed)
 Patients son is requesting a call for recommendations from message sent yesterday. Please advise. He states he is only in town until this weekend and is wanting to discuss. Please advise.

## 2023-11-01 ENCOUNTER — Telehealth: Payer: Self-pay | Admitting: *Deleted

## 2023-11-01 NOTE — Transitions of Care (Post Inpatient/ED Visit) (Signed)
   11/01/2023  Name: [name] MRN: [id number] DOB: [birth year]  Today's TOC FU Call Status: Today's TOC FU Call Status:: Unsuccessful Call (2nd Attempt) Unsuccessful Call (2nd Attempt) Date: 11/01/23  Attempted to reach the patient regarding the most recent Inpatient/ED visit.  Follow Up Plan: No further outreach attempts will be made at this time. We have been unable to contact the patient.  Signature Maryalyce Sanjuan, [facility]

## 2023-11-07 DIAGNOSIS — I70244 Atherosclerosis of native arteries of left leg with ulceration of heel and midfoot: Secondary | ICD-10-CM | POA: Diagnosis not present

## 2023-11-07 DIAGNOSIS — E1152 Type 2 diabetes mellitus with diabetic peripheral angiopathy with gangrene: Secondary | ICD-10-CM | POA: Diagnosis not present

## 2023-11-07 DIAGNOSIS — E785 Hyperlipidemia, unspecified: Secondary | ICD-10-CM | POA: Diagnosis not present

## 2023-11-14 DIAGNOSIS — I70244 Atherosclerosis of native arteries of left leg with ulceration of heel and midfoot: Secondary | ICD-10-CM | POA: Diagnosis not present

## 2023-11-15 ENCOUNTER — Other Ambulatory Visit: Payer: Self-pay | Admitting: *Deleted

## 2023-11-15 DIAGNOSIS — M810 Age-related osteoporosis without current pathological fracture: Secondary | ICD-10-CM

## 2023-11-15 MED ORDER — DENOSUMAB 60 MG/ML ~~LOC~~ SOSY
60.0000 mg | PREFILLED_SYRINGE | Freq: Once | SUBCUTANEOUS | Status: AC
  Administered 2023-12-20: 60 mg via SUBCUTANEOUS

## 2023-11-22 ENCOUNTER — Telehealth: Payer: Self-pay | Admitting: Internal Medicine

## 2023-11-22 DIAGNOSIS — I70244 Atherosclerosis of native arteries of left leg with ulceration of heel and midfoot: Secondary | ICD-10-CM | POA: Diagnosis not present

## 2023-11-22 DIAGNOSIS — E785 Hyperlipidemia, unspecified: Secondary | ICD-10-CM | POA: Diagnosis not present

## 2023-11-22 DIAGNOSIS — E1152 Type 2 diabetes mellitus with diabetic peripheral angiopathy with gangrene: Secondary | ICD-10-CM | POA: Diagnosis not present

## 2023-11-22 DIAGNOSIS — I7025 Atherosclerosis of native arteries of other extremities with ulceration: Secondary | ICD-10-CM | POA: Diagnosis not present

## 2023-11-22 DIAGNOSIS — I70245 Atherosclerosis of native arteries of left leg with ulceration of other part of foot: Secondary | ICD-10-CM | POA: Diagnosis not present

## 2023-11-22 DIAGNOSIS — L928 Other granulomatous disorders of the skin and subcutaneous tissue: Secondary | ICD-10-CM | POA: Diagnosis not present

## 2023-11-22 DIAGNOSIS — L8990 Pressure ulcer of unspecified site, unspecified stage: Secondary | ICD-10-CM | POA: Diagnosis not present

## 2023-11-22 DIAGNOSIS — I70213 Atherosclerosis of native arteries of extremities with intermittent claudication, bilateral legs: Secondary | ICD-10-CM | POA: Diagnosis not present

## 2023-11-22 DIAGNOSIS — M79605 Pain in left leg: Secondary | ICD-10-CM | POA: Diagnosis not present

## 2023-11-22 NOTE — Telephone Encounter (Signed)
Requesting: HYDROcodone-acetaminophen (NORCO) 10-325 MG tablet  Contract: n/a UDS: 11/21/2022 Last Visit: 10/22/2023 Next Visit: 02/18/2024 Last Refill: 09/18/2023 #120 with no refills  Please Advise

## 2023-11-22 NOTE — Telephone Encounter (Signed)
Copied from CRM [id number]. Topic: Clinical - Medication Refill >> Nov 22, 2023  3:56 PM Truddie Crumble wrote: Most Recent Primary Care Visit:  Provider: Wanda Plump  Department: [facility]  Visit Type: OFFICE VISIT  Date: 10/22/2023  Medication: HYDROcodone-acetaminophen  Has the patient contacted their pharmacy? Yes (Agent: If no, request that the patient contact the pharmacy for the refill. If patient does not wish to contact the pharmacy document the reason why and proceed with request.) (Agent: If yes, when and what did the pharmacy advise?)  Is this the correct pharmacy for this prescription? Yes If no, delete pharmacy and type the correct one.  This is the patient's preferred pharmacy:  [id number] - [place], [place] - [address] [facility] [address] [place] [address] Phone: [phone] Fax: [phone]  Has the prescription been filled recently? No  Is the patient out of the medication? Yes  Has the patient been seen for an appointment in the last year OR does the patient have an upcoming appointment? Yes  Can we respond through MyChart? Yes  Agent: Please be advised that Rx refills may take up to 3 business days. We ask that you follow-up with your pharmacy.

## 2023-11-23 ENCOUNTER — Telehealth: Payer: Self-pay

## 2023-11-23 DIAGNOSIS — I70213 Atherosclerosis of native arteries of extremities with intermittent claudication, bilateral legs: Secondary | ICD-10-CM | POA: Diagnosis not present

## 2023-11-23 MED ORDER — HYDROCODONE-ACETAMINOPHEN 10-325 MG PO TABS: 1.0000 | ORAL_TABLET | Freq: Four times a day (QID) | ORAL | 0 refills | Status: DC | PRN

## 2023-11-23 NOTE — Telephone Encounter (Signed)
Prolia VOB initiated via [email or web address]  Next Prolia inj DUE: 12/18/23

## 2023-11-23 NOTE — Telephone Encounter (Signed)
PDMP reviewed.  Prescription sent

## 2023-11-26 NOTE — Telephone Encounter (Signed)
Pt ready for scheduling for PROLIA on or after : 12/18/23  Out-of-pocket cost due at time of visit: $0  Number of injection/visits approved: --  Primary: MEDICARE Prolia co-insurance: 0% Admin fee co-insurance: 0%  Secondary: TRICARE Prolia co-insurance:  Admin fee co-insurance:   Medical Benefit Details: Date Benefits were checked: 11/23/23 Deductible: $257 Met of $257 Required/ Coinsurance: 0%/ Admin Fee: 0%  Prior Auth: N/A PA# Expiration Date:   # of doses approved:  Pharmacy benefit: Copay $--- If patient wants fill through the pharmacy benefit please send prescription to:  --- , and include estimated need by date in rx notes. Pharmacy will ship medication directly to the office.  Patient NOT eligible for Prolia Copay Card. Copay Card can make patient's cost as little as $25. Link to apply: [email or web address]://[email or web address]  ** This summary of benefits is an estimation of the patient's out-of-pocket cost. Exact cost may very based on individual plan coverage.

## 2023-11-26 NOTE — Telephone Encounter (Signed)
 Marland Kitchen

## 2023-11-27 NOTE — Telephone Encounter (Signed)
 Pt ready for scheduling for PROLIA on or after : 12/18/23  Out-of-pocket cost due at time of visit: $0  Number of injection/visits approved: --  Primary: MEDICARE Prolia co-insurance: 0% Admin fee co-insurance: 0%  Secondary: TRICARE Prolia co-insurance:  Admin fee co-insurance:   Medical Benefit Details: Date Benefits were checked: 11/23/23 Deductible: $257 Met of $257 Required/ Coinsurance: 0%/ Admin Fee: 0%  Prior Auth: N/A PA# Expiration Date:   # of doses approved:  Pharmacy benefit: Copay $--- If patient wants fill through the pharmacy benefit please send prescription to:  --- , and include estimated need by date in rx notes. Pharmacy will ship medication directly to the office.  Patient NOT eligible for Prolia Copay Card. Copay Card can make patient's cost as little as $25. Link to apply: [email or web address]://[email or web address].[email or web address]  ** This summary of benefits is an estimation of the patient's out-of-pocket cost. Exact cost may very based on individual plan coverage.

## 2023-11-27 NOTE — Telephone Encounter (Signed)
Spoke with son Leotis Shames and he will call back to schedule.  Patient can get on or after 12/18/23.  I will $0 OOP

## 2023-11-29 DIAGNOSIS — S91102D Unspecified open wound of left great toe without damage to nail, subsequent encounter: Secondary | ICD-10-CM | POA: Diagnosis not present

## 2023-11-29 DIAGNOSIS — I70245 Atherosclerosis of native arteries of left leg with ulceration of other part of foot: Secondary | ICD-10-CM | POA: Diagnosis not present

## 2023-11-29 NOTE — Telephone Encounter (Signed)
 Copied from CRM [id number]. Topic: Appointments - Appointment Scheduling >> Nov 29, 2023 11:51 AM [name] wrote: Patient son is requesting a call back as he needs to schedule the patients prolia  injection for the  62 th or the 75 th of February - Please call Juliane at [phone]

## 2023-11-29 NOTE — Telephone Encounter (Signed)
 Left message on machine to call back to schedule for Prolia  injection.  $0 OOP.  Ok to schedule for 2/27 or 2/28.

## 2023-11-30 NOTE — Telephone Encounter (Signed)
 Pt has been scheduled.

## 2023-12-06 DIAGNOSIS — E1152 Type 2 diabetes mellitus with diabetic peripheral angiopathy with gangrene: Secondary | ICD-10-CM | POA: Diagnosis not present

## 2023-12-06 DIAGNOSIS — E785 Hyperlipidemia, unspecified: Secondary | ICD-10-CM | POA: Diagnosis not present

## 2023-12-06 DIAGNOSIS — I7025 Atherosclerosis of native arteries of other extremities with ulceration: Secondary | ICD-10-CM | POA: Diagnosis not present

## 2023-12-06 DIAGNOSIS — I70213 Atherosclerosis of native arteries of extremities with intermittent claudication, bilateral legs: Secondary | ICD-10-CM | POA: Diagnosis not present

## 2023-12-10 NOTE — Telephone Encounter (Signed)
 Pt is scheduled 12/20/23

## 2023-12-18 DIAGNOSIS — S91102D Unspecified open wound of left great toe without damage to nail, subsequent encounter: Secondary | ICD-10-CM | POA: Diagnosis not present

## 2023-12-18 DIAGNOSIS — I70245 Atherosclerosis of native arteries of left leg with ulceration of other part of foot: Secondary | ICD-10-CM | POA: Diagnosis not present

## 2023-12-20 ENCOUNTER — Ambulatory Visit (INDEPENDENT_AMBULATORY_CARE_PROVIDER_SITE_OTHER): Payer: Medicare Other | Admitting: *Deleted

## 2023-12-20 DIAGNOSIS — M810 Age-related osteoporosis without current pathological fracture: Secondary | ICD-10-CM

## 2023-12-20 MED ORDER — DENOSUMAB 60 MG/ML ~~LOC~~ SOSY
60.0000 mg | PREFILLED_SYRINGE | SUBCUTANEOUS | Status: AC
  Administered 2024-07-01: 60 mg via SUBCUTANEOUS

## 2023-12-20 NOTE — Progress Notes (Signed)
 Patient here for prolia injection per physicians orders.  Injection given right subq and patient tolerated well.

## 2023-12-25 DIAGNOSIS — I70244 Atherosclerosis of native arteries of left leg with ulceration of heel and midfoot: Secondary | ICD-10-CM | POA: Diagnosis not present

## 2023-12-25 DIAGNOSIS — Z79899 Other long term (current) drug therapy: Secondary | ICD-10-CM | POA: Diagnosis not present

## 2023-12-25 DIAGNOSIS — K7689 Other specified diseases of liver: Secondary | ICD-10-CM | POA: Diagnosis not present

## 2023-12-25 DIAGNOSIS — R399 Unspecified symptoms and signs involving the genitourinary system: Secondary | ICD-10-CM | POA: Diagnosis not present

## 2023-12-25 DIAGNOSIS — N179 Acute kidney failure, unspecified: Secondary | ICD-10-CM | POA: Diagnosis not present

## 2023-12-25 DIAGNOSIS — E119 Type 2 diabetes mellitus without complications: Secondary | ICD-10-CM | POA: Diagnosis not present

## 2023-12-25 DIAGNOSIS — K449 Diaphragmatic hernia without obstruction or gangrene: Secondary | ICD-10-CM | POA: Diagnosis not present

## 2023-12-25 DIAGNOSIS — K7460 Unspecified cirrhosis of liver: Secondary | ICD-10-CM | POA: Diagnosis not present

## 2023-12-25 DIAGNOSIS — Z7982 Long term (current) use of aspirin: Secondary | ICD-10-CM | POA: Diagnosis not present

## 2023-12-25 DIAGNOSIS — M549 Dorsalgia, unspecified: Secondary | ICD-10-CM | POA: Diagnosis not present

## 2023-12-25 DIAGNOSIS — I70245 Atherosclerosis of native arteries of left leg with ulceration of other part of foot: Secondary | ICD-10-CM | POA: Diagnosis not present

## 2023-12-25 DIAGNOSIS — R410 Disorientation, unspecified: Secondary | ICD-10-CM | POA: Diagnosis not present

## 2023-12-25 DIAGNOSIS — Z0189 Encounter for other specified special examinations: Secondary | ICD-10-CM | POA: Diagnosis not present

## 2023-12-25 DIAGNOSIS — Z7901 Long term (current) use of anticoagulants: Secondary | ICD-10-CM | POA: Diagnosis not present

## 2023-12-25 DIAGNOSIS — R300 Dysuria: Secondary | ICD-10-CM | POA: Diagnosis not present

## 2023-12-25 DIAGNOSIS — A499 Bacterial infection, unspecified: Secondary | ICD-10-CM | POA: Diagnosis not present

## 2023-12-25 DIAGNOSIS — K5730 Diverticulosis of large intestine without perforation or abscess without bleeding: Secondary | ICD-10-CM | POA: Diagnosis not present

## 2023-12-25 DIAGNOSIS — L8990 Pressure ulcer of unspecified site, unspecified stage: Secondary | ICD-10-CM | POA: Diagnosis not present

## 2023-12-25 DIAGNOSIS — Z951 Presence of aortocoronary bypass graft: Secondary | ICD-10-CM | POA: Diagnosis not present

## 2023-12-25 DIAGNOSIS — R4189 Other symptoms and signs involving cognitive functions and awareness: Secondary | ICD-10-CM | POA: Diagnosis not present

## 2023-12-25 DIAGNOSIS — I7025 Atherosclerosis of native arteries of other extremities with ulceration: Secondary | ICD-10-CM | POA: Diagnosis not present

## 2023-12-28 DIAGNOSIS — E119 Type 2 diabetes mellitus without complications: Secondary | ICD-10-CM | POA: Diagnosis not present

## 2023-12-28 DIAGNOSIS — I70213 Atherosclerosis of native arteries of extremities with intermittent claudication, bilateral legs: Secondary | ICD-10-CM | POA: Diagnosis not present

## 2023-12-28 DIAGNOSIS — I10 Essential (primary) hypertension: Secondary | ICD-10-CM | POA: Diagnosis not present

## 2023-12-28 DIAGNOSIS — I25728 Atherosclerosis of autologous artery coronary artery bypass graft(s) with other forms of angina pectoris: Secondary | ICD-10-CM | POA: Diagnosis not present

## 2024-01-03 DIAGNOSIS — M79605 Pain in left leg: Secondary | ICD-10-CM | POA: Diagnosis not present

## 2024-01-03 DIAGNOSIS — E1152 Type 2 diabetes mellitus with diabetic peripheral angiopathy with gangrene: Secondary | ICD-10-CM | POA: Diagnosis not present

## 2024-01-03 DIAGNOSIS — I70244 Atherosclerosis of native arteries of left leg with ulceration of heel and midfoot: Secondary | ICD-10-CM | POA: Diagnosis not present

## 2024-01-03 DIAGNOSIS — E785 Hyperlipidemia, unspecified: Secondary | ICD-10-CM | POA: Diagnosis not present

## 2024-01-07 ENCOUNTER — Other Ambulatory Visit: Payer: Self-pay | Admitting: Gastroenterology

## 2024-01-10 DIAGNOSIS — E1152 Type 2 diabetes mellitus with diabetic peripheral angiopathy with gangrene: Secondary | ICD-10-CM | POA: Diagnosis not present

## 2024-01-10 DIAGNOSIS — I7025 Atherosclerosis of native arteries of other extremities with ulceration: Secondary | ICD-10-CM | POA: Diagnosis not present

## 2024-01-10 DIAGNOSIS — E785 Hyperlipidemia, unspecified: Secondary | ICD-10-CM | POA: Diagnosis not present

## 2024-01-10 DIAGNOSIS — I70213 Atherosclerosis of native arteries of extremities with intermittent claudication, bilateral legs: Secondary | ICD-10-CM | POA: Diagnosis not present

## 2024-01-16 ENCOUNTER — Telehealth: Payer: Self-pay | Admitting: Internal Medicine

## 2024-01-16 NOTE — Telephone Encounter (Unsigned)
 Copied from CRM [id number]. Topic: Clinical - Medication Refill >> Jan 16, 2024  1:58 PM Fredrich Romans wrote: Most Recent Primary Care Visit:  Provider: Thelma Barge D  Department: [facility]  Visit Type: CLINICAL SUPPORT  Date: 12/20/2023  Medication:  HYDROcodone-acetaminophen (NORCO) 10-325 MG tablet    Has the patient contacted their pharmacy? Yes (Agent: If no, request that the patient contact the pharmacy for the refill. If patient does not wish to contact the pharmacy document the reason why and proceed with request.) (Agent: If yes, when and what did the pharmacy advise?)  Is this the correct pharmacy for this prescription? Yes If no, delete pharmacy and type the correct one.  This is the patient's preferred pharmacy:  [facility] #[id number] - [place], [place] - [address] [facility] [place] [facility] [address] [place] [address] Phone: [phone] Fax: [phone]  [facility] #[id number] [place], [place] - [address] AT [facility] [address] [facility] [address] & [facility] [address] [place] [address] Phone: [phone] Fax: [phone]     Has the prescription been filled recently? Yes  Is the patient out of the medication? No (8 days left)  Has the patient been seen for an appointment in the last year OR does the patient have an upcoming appointment? Yes  Can we respond through MyChart? No  Agent: Please be advised that Rx refills may take up to 3 business days. We ask that you follow-up with your pharmacy.

## 2024-01-16 NOTE — Telephone Encounter (Signed)
 Requesting: hydrocodone 10-325mg  Contract: 11/21/22 UDS: 11/21/22 Last Visit: 10/22/23 Next Visit: 02/18/24 Last Refill: 11/23/23 #120 and 0RF   Please Advise

## 2024-01-17 DIAGNOSIS — E785 Hyperlipidemia, unspecified: Secondary | ICD-10-CM | POA: Diagnosis not present

## 2024-01-17 DIAGNOSIS — I7025 Atherosclerosis of native arteries of other extremities with ulceration: Secondary | ICD-10-CM | POA: Diagnosis not present

## 2024-01-17 DIAGNOSIS — E1152 Type 2 diabetes mellitus with diabetic peripheral angiopathy with gangrene: Secondary | ICD-10-CM | POA: Diagnosis not present

## 2024-01-17 DIAGNOSIS — I70213 Atherosclerosis of native arteries of extremities with intermittent claudication, bilateral legs: Secondary | ICD-10-CM | POA: Diagnosis not present

## 2024-01-17 MED ORDER — HYDROCODONE-ACETAMINOPHEN 10-325 MG PO TABS: 1.0000 | ORAL_TABLET | Freq: Four times a day (QID) | ORAL | 0 refills | Status: DC | PRN

## 2024-01-17 NOTE — Telephone Encounter (Signed)
 PDMP okay, Rx sent

## 2024-01-24 DIAGNOSIS — I70245 Atherosclerosis of native arteries of left leg with ulceration of other part of foot: Secondary | ICD-10-CM | POA: Diagnosis not present

## 2024-01-24 DIAGNOSIS — S91102D Unspecified open wound of left great toe without damage to nail, subsequent encounter: Secondary | ICD-10-CM | POA: Diagnosis not present

## 2024-01-31 DIAGNOSIS — I70213 Atherosclerosis of native arteries of extremities with intermittent claudication, bilateral legs: Secondary | ICD-10-CM | POA: Diagnosis not present

## 2024-01-31 DIAGNOSIS — E1152 Type 2 diabetes mellitus with diabetic peripheral angiopathy with gangrene: Secondary | ICD-10-CM | POA: Diagnosis not present

## 2024-01-31 DIAGNOSIS — I70245 Atherosclerosis of native arteries of left leg with ulceration of other part of foot: Secondary | ICD-10-CM | POA: Diagnosis not present

## 2024-01-31 DIAGNOSIS — E785 Hyperlipidemia, unspecified: Secondary | ICD-10-CM | POA: Diagnosis not present

## 2024-01-31 DIAGNOSIS — I70244 Atherosclerosis of native arteries of left leg with ulceration of heel and midfoot: Secondary | ICD-10-CM | POA: Diagnosis not present

## 2024-01-31 DIAGNOSIS — I7025 Atherosclerosis of native arteries of other extremities with ulceration: Secondary | ICD-10-CM | POA: Diagnosis not present

## 2024-02-07 DIAGNOSIS — I70245 Atherosclerosis of native arteries of left leg with ulceration of other part of foot: Secondary | ICD-10-CM | POA: Diagnosis not present

## 2024-02-07 DIAGNOSIS — S91102D Unspecified open wound of left great toe without damage to nail, subsequent encounter: Secondary | ICD-10-CM | POA: Diagnosis not present

## 2024-02-14 DIAGNOSIS — S91102D Unspecified open wound of left great toe without damage to nail, subsequent encounter: Secondary | ICD-10-CM | POA: Diagnosis not present

## 2024-02-14 DIAGNOSIS — I70245 Atherosclerosis of native arteries of left leg with ulceration of other part of foot: Secondary | ICD-10-CM | POA: Diagnosis not present

## 2024-02-18 ENCOUNTER — Ambulatory Visit (HOSPITAL_BASED_OUTPATIENT_CLINIC_OR_DEPARTMENT_OTHER)
Admission: RE | Admit: 2024-02-18 | Discharge: 2024-02-18 | Disposition: A | Discharge status: Home / Self Care | Source: Ambulatory Visit | Attending: Internal Medicine | Admitting: Internal Medicine

## 2024-02-18 ENCOUNTER — Inpatient Hospital Stay (HOSPITAL_BASED_OUTPATIENT_CLINIC_OR_DEPARTMENT_OTHER): Payer: Medicare Other | Admitting: Family

## 2024-02-18 ENCOUNTER — Ambulatory Visit (INDEPENDENT_AMBULATORY_CARE_PROVIDER_SITE_OTHER): Payer: Medicare Other | Admitting: Internal Medicine

## 2024-02-18 ENCOUNTER — Inpatient Hospital Stay: Payer: Medicare Other | Attending: Hematology & Oncology

## 2024-02-18 ENCOUNTER — Encounter: Payer: Self-pay | Admitting: Internal Medicine

## 2024-02-18 ENCOUNTER — Other Ambulatory Visit: Payer: Self-pay

## 2024-02-18 ENCOUNTER — Encounter: Payer: Self-pay | Admitting: Family

## 2024-02-18 VITALS — BP 128/78 | HR 73 | Temp 97.8°F | Resp 18 | Ht 64.0 in | Wt 172.1 lb

## 2024-02-18 VITALS — BP 133/62 | HR 74 | Temp 98.0°F | Resp 19 | Ht 64.0 in

## 2024-02-18 DIAGNOSIS — E538 Deficiency of other specified B group vitamins: Secondary | ICD-10-CM

## 2024-02-18 DIAGNOSIS — E782 Mixed hyperlipidemia: Secondary | ICD-10-CM

## 2024-02-18 DIAGNOSIS — D709 Neutropenia, unspecified: Secondary | ICD-10-CM | POA: Diagnosis not present

## 2024-02-18 DIAGNOSIS — I10 Essential (primary) hypertension: Secondary | ICD-10-CM | POA: Diagnosis not present

## 2024-02-18 DIAGNOSIS — D509 Iron deficiency anemia, unspecified: Secondary | ICD-10-CM

## 2024-02-18 DIAGNOSIS — D696 Thrombocytopenia, unspecified: Secondary | ICD-10-CM

## 2024-02-18 DIAGNOSIS — E1142 Type 2 diabetes mellitus with diabetic polyneuropathy: Secondary | ICD-10-CM | POA: Diagnosis not present

## 2024-02-18 DIAGNOSIS — G894 Chronic pain syndrome: Secondary | ICD-10-CM | POA: Diagnosis not present

## 2024-02-18 DIAGNOSIS — D500 Iron deficiency anemia secondary to blood loss (chronic): Secondary | ICD-10-CM | POA: Insufficient documentation

## 2024-02-18 DIAGNOSIS — M542 Cervicalgia: Secondary | ICD-10-CM | POA: Insufficient documentation

## 2024-02-18 DIAGNOSIS — K922 Gastrointestinal hemorrhage, unspecified: Secondary | ICD-10-CM | POA: Diagnosis not present

## 2024-02-18 DIAGNOSIS — Z79899 Other long term (current) drug therapy: Secondary | ICD-10-CM

## 2024-02-18 DIAGNOSIS — D649 Anemia, unspecified: Secondary | ICD-10-CM

## 2024-02-18 DIAGNOSIS — Z981 Arthrodesis status: Secondary | ICD-10-CM | POA: Diagnosis not present

## 2024-02-18 LAB — CBC WITH DIFFERENTIAL (CANCER CENTER ONLY)
Abs Immature Granulocytes: 0.01 10*3/uL (ref 0.00–0.07)
Basophils Absolute: 0 10*3/uL (ref 0.0–0.1)
Basophils Relative: 0 %
Eosinophils Absolute: 0.1 10*3/uL (ref 0.0–0.5)
Eosinophils Relative: 2 %
HCT: 34.7 % — ABNORMAL LOW (ref 36.0–46.0)
Hemoglobin: 11.3 g/dL — ABNORMAL LOW (ref 12.0–15.0)
Immature Granulocytes: 0 %
Lymphocytes Relative: 34 %
Lymphs Abs: 1.3 10*3/uL (ref 0.7–4.0)
MCH: 31.8 pg (ref 26.0–34.0)
MCHC: 32.6 g/dL (ref 30.0–36.0)
MCV: 97.7 fL (ref 80.0–100.0)
Monocytes Absolute: 0.3 10*3/uL (ref 0.1–1.0)
Monocytes Relative: 7 %
Neutro Abs: 2.2 10*3/uL (ref 1.7–7.7)
Neutrophils Relative %: 57 %
Platelet Count: 111 10*3/uL — ABNORMAL LOW (ref 150–400)
RBC: 3.55 MIL/uL — ABNORMAL LOW (ref 3.87–5.11)
RDW: 13.9 % (ref 11.5–15.5)
WBC Count: 3.9 10*3/uL — ABNORMAL LOW (ref 4.0–10.5)
nRBC: 0 % (ref 0.0–0.2)

## 2024-02-18 LAB — CMP (CANCER CENTER ONLY)
ALT: 11 U/L (ref 0–44)
AST: 22 U/L (ref 15–41)
Albumin: 4.1 g/dL (ref 3.5–5.0)
Alkaline Phosphatase: 36 U/L — ABNORMAL LOW (ref 38–126)
Anion gap: 9 (ref 5–15)
BUN: 26 mg/dL — ABNORMAL HIGH (ref 8–23)
CO2: 21 mmol/L — ABNORMAL LOW (ref 22–32)
Calcium: 9.3 mg/dL (ref 8.9–10.3)
Chloride: 109 mmol/L (ref 98–111)
Creatinine: 1.41 mg/dL — ABNORMAL HIGH (ref 0.44–1.00)
GFR, Estimated: 36 mL/min — ABNORMAL LOW (ref >60)
Glucose, Bld: 132 mg/dL — ABNORMAL HIGH (ref 70–99)
Potassium: 4.3 mmol/L (ref 3.5–5.1)
Sodium: 139 mmol/L (ref 135–145)
Total Bilirubin: 0.5 mg/dL (ref 0.0–1.2)
Total Protein: 6.5 g/dL (ref 6.5–8.1)

## 2024-02-18 LAB — FERRITIN: Ferritin: 30 ng/mL (ref 11–307)

## 2024-02-18 LAB — VITAMIN B12: Vitamin B-12: 723 pg/mL (ref 180–914)

## 2024-02-18 NOTE — Patient Instructions (Addendum)
 INSTRUCTIONS  FOR TODAY   Drink plenty of water   Check the  blood pressure regularly Blood pressure goal:  between 110/65 and  135/85. If it is consistently higher or lower, let me know     GO TO THE LAB : Get the blood work     Next office visit for a checkup in 3 months Please make an appointment before you leave today      STOP BY THE FIRST FLOOR:  get the XR

## 2024-02-18 NOTE — Progress Notes (Signed)
 Hematology and Oncology Follow Up Visit  [name] [id number] Jan 15, 1935 89 y.o. 02/18/2024   Principle Diagnosis:  Iron deficiency anemia  Mild thrombocytopenia  Mild Neutropenia   Current Therapy:        IV iron as indicated    Interim History:  [name] is here today with her son for follow-up. She is doing fairly well. 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 She is seeing a wound care specialist and team. Her son is also vigilant caring for her dressings.  No fever, chills, n/v, cough, rash, dizziness, SOB, chest pain, palpitations, abdominal pain or changes in bowel or bladder habits at this time.  She has some occasional fatigue as well as neck pain. She plans to try Voltaren  cream on her neck for pain relief.  No falls or syncope reported.  Appetite is fair but she is staying well hydrated. She is unable to stand for weight.   ECOG Performance Status: 1 - Symptomatic but completely ambulatory  Medications:  Allergies as of 02/18/2024       Reactions   Metformin  And Related Diarrhea, Nausea Only, Other (See Comments)   dizzy, tired, chills   Cymbalta [duloxetine Hcl] Swelling   Swelling in legs   Gabapentin Swelling   Swelling in legs   Silicone Hives, Itching, Dermatitis, Rash   Tramadol Swelling   Adhesive [tape] Rash   Cefazolin Hives      Ciprofloxacin  Other (See Comments)   body aches   Clorazepate Dipotassium Other (See Comments)   Unknown reaction   Dilaudid  [hydromorphone  Hcl] Itching    confused,   Doxycycline Rash   Enablex [darifenacin Hydrobromide Er] Other (See Comments)   Hypotension, near syncope   Levofloxacin Other (See Comments)   Causes wrist pain   Lyrica [pregabalin] Swelling   Swelling in legs   Methadone Hcl Other (See Comments)   Reaction unknown   Morphine  And Codeine Other (See Comments)   Confusion, constipation.   Talwin [pentazocine] Other (See Comments)   "climbing  walls" anxiety        Medication List        Accurate as of February 18, 2024  2:00 PM. If you have any questions, ask your nurse or doctor.          Alpha-Lipoic Acid 600 MG Caps Take 1,200 mg by mouth daily.   amitriptyline  25 MG tablet Commonly known as: ELAVIL  Take 1 tablet (25 mg total) by mouth at bedtime.   atorvastatin  40 MG tablet Commonly known as: LIPITOR Take 40 mg by mouth daily.   carvedilol  12.5 MG tablet Commonly known as: COREG  Take 12.5 mg by mouth 2 (two) times daily with a meal.   CINNAMON PO Take 2,000 mg by mouth daily.   clopidogrel  75 MG tablet Commonly known as: PLAVIX  Take 1 tablet (75 mg total) by mouth at bedtime. Restart on 12/04/2020   Cranberry 200 MG Caps   diclofenac  Sodium 1 % Gel Commonly known as: VOLTAREN  Apply 4 g topically 4 (four) times daily. What changed: additional instructions   dicyclomine  10 MG capsule Commonly known as: BENTYL  TAKE 1 CAPSULE BY MOUTH THREE TIMES DAILY BEFORE MEALS   donepezil  10 MG tablet Commonly known as: ARICEPT  Take 1 tablet (10 mg total) by mouth at bedtime.   famotidine  20 MG tablet Commonly known as: PEPCID  Take 1 tablet (20 mg total) by mouth 2 (two)  times daily.   freestyle lancets CHECK BLOOD SUGAR TWICE DAILY   FREESTYLE LITE test strip Generic drug: glucose blood USE TO CHECK BLOOD SUGAR NO MORE THAN TWICE DAILY   furosemide  20 MG tablet Commonly known as: LASIX  Take 20 mg by mouth daily.   HYDROcodone -acetaminophen  10-325 MG tablet Commonly known as: NORCO Take 1 tablet by mouth every 6 (six) hours as needed.   hydrocortisone  2.5 % cream APPLY TOPICALLY TO THE AFFECTED AREA TWICE DAILY   hydrocortisone  25 MG suppository Commonly known as: ANUSOL -HC Place 1 suppository (25 mg total) rectally 2 (two) times daily. As needed   hydrocortisone -pramoxine 2.5-1 % rectal cream Commonly known as: ANALPRAM-HC APPLY RECTALLY TO THE AFFECTED AREA IN THE MORNING AND AT BEDTIME AS  NEEDED   ketoconazole  2 % cream Commonly known as: NIZORAL  Apply 1 Application topically 2 (two) times daily.   linaclotide  72 MCG capsule Commonly known as: Linzess  Take 1 capsule (72 mcg total) by mouth daily before breakfast.   loperamide  2 MG tablet Commonly known as: IMODIUM  A-D Take 2 mg by mouth as needed for diarrhea or loose stools.   MegaRed Omega-3 Krill Oil 500 MG Caps Restart on 12/04/2020   methenamine  1 g tablet Commonly known as: HIPREX  Take 0.5 tablets by mouth daily.   naloxone  4 MG/0.1ML Liqd nasal spray kit Commonly known as: NARCAN  Place 1 spray into the nose once as needed for up to 1 dose.   nitroGLYCERIN  0.4 MG SL tablet Commonly known as: NITROSTAT  Place 1 tablet (0.4 mg total) under the tongue every 5 (five) minutes x 3 doses as needed for chest pain.   omeprazole  20 MG capsule Commonly known as: PRILOSEC Take 1 capsule (20 mg total) by mouth daily. As needed   ondansetron  4 MG tablet Commonly known as: ZOFRAN  Take 1 tablet (4 mg total) by mouth every 8 (eight) hours as needed for nausea or vomiting.   OVER THE COUNTER MEDICATION 1 tablet 3 (three) times daily. Berberorine Gluco Defense   pramipexole  0.125 MG tablet Commonly known as: MIRAPEX  Take 1-2 tablets (0.125-0.25 mg total) by mouth at bedtime as needed.   prenatal multivitamin Tabs tablet Take 1 tablet by mouth daily at 12 noon.   PreserVision AREDS 2 Caps Take 1 capsule by mouth 2 (two) times daily.   PROBIOTIC PEARLS PO Take 1 tablet by mouth daily.   REFRESH OP Place 1 drop into both eyes 3 (three) times daily as needed (dry eyes).   rivaroxaban  2.5 MG Tabs tablet Commonly known as: XARELTO  Take 1 tablet (2.5 mg total) by mouth 2 (two) times daily. Restart on 12/04/2020   silver  sulfADIAZINE  1 % cream Commonly known as: Silvadene  Apply 1 application topically daily.   Tussin DM 10-100 MG/5ML liquid Generic drug: dextromethorphan-guaiFENesin Take by mouth every 4  (four) hours as needed for cough.   Vitamin B-12 5000 MCG Subl Place 5,000 mcg under the tongue every other day.   vitamin C with rose hips 500 MG tablet Take 500 mg by mouth in the morning and at bedtime.   Vitamin D3 125 MCG (5000 UT) Caps Take 5,000 Units by mouth every evening. Reported on 11/24/2015   vitamin E  180 MG (400 UNITS) capsule Take 400 Units by mouth daily.   Zinc Sulfate 66 MG Tabs        Allergies:  Allergies  Allergen Reactions   Metformin  And Related Diarrhea, Nausea Only and Other (See Comments)    dizzy, tired, chills  Cymbalta [Duloxetine Hcl] Swelling    Swelling in legs   Gabapentin Swelling    Swelling in legs   Silicone Hives, Itching, Dermatitis and Rash   Tramadol Swelling   Adhesive [Tape] Rash   Cefazolin Hives        Ciprofloxacin  Other (See Comments)    body aches    Clorazepate Dipotassium Other (See Comments)    Unknown reaction   Dilaudid  [Hydromorphone  Hcl] Itching     confused,   Doxycycline Rash   Enablex [Darifenacin Hydrobromide Er] Other (See Comments)    Hypotension, near syncope   Levofloxacin Other (See Comments)    Causes wrist pain   Lyrica [Pregabalin] Swelling    Swelling in legs   Methadone Hcl Other (See Comments)    Reaction unknown   Morphine  And Codeine Other (See Comments)    Confusion, constipation.    Talwin [Pentazocine] Other (See Comments)    "climbing walls" anxiety    Past Medical History, Surgical history, Social history, and Family History were reviewed and updated.  Review of Systems: All other 10 point review of systems is negative.   Physical Exam:  vitals were not taken for this visit.   Wt Readings from Last 3 Encounters:  10/29/23 156 lb 1.4 oz (70.8 kg)  10/19/23 156 lb (70.8 kg)  08/15/23 156 lb (70.8 kg)    Ocular: Sclerae unicteric, pupils equal, round and reactive to light Ear-nose-throat: Oropharynx clear, dentition fair Lymphatic: No cervical or supraclavicular  adenopathy Lungs no rales or rhonchi, good excursion bilaterally Heart regular rate and rhythm, no murmur appreciated Abd soft, nontender, positive bowel sounds MSK no focal spinal tenderness, no joint edema Neuro: non-focal, well-oriented, appropriate affect Breasts: Deferred   Lab Results  Component Value Date   WBC 3.9 (L) 02/18/2024   HGB 11.3 (L) 02/18/2024   HCT 34.7 (L) 02/18/2024   MCV 97.7 02/18/2024   PLT 111 (L) 02/18/2024   Lab Results  Component Value Date   FERRITIN 27 10/19/2023   IRON 51 10/19/2023   TIBC 304 10/19/2023   UIBC 253 10/19/2023   IRONPCTSAT 17 10/19/2023   Lab Results  Component Value Date   RETICCTPCT 1.0 06/18/2023   RBC 3.55 (L) 02/18/2024   No results found for: "KPAFRELGTCHN", "LAMBDASER", "KAPLAMBRATIO" No results found for: "IGGSERUM", "IGA", "IGMSERUM" No results found for: "TOTALPROTELP", "ALBUMINELP", "A1GS", "A2GS", "BETS", "BETA2SER", "GAMS", "MSPIKE", "SPEI"   Chemistry      Component Value Date/Time   NA 140 10/29/2023 1851   NA 140 09/03/2020 0000   K 4.3 10/29/2023 1851   CL 109 10/29/2023 1851   CO2 24 10/29/2023 1851   BUN 24 (H) 10/29/2023 1851   BUN 17 09/03/2020 0000   CREATININE 1.16 (H) 10/29/2023 1851   CREATININE 1.18 (H) 10/19/2023 1329   CREATININE 1.12 (H) 06/22/2023 1515   GLU 146 09/03/2020 0000      Component Value Date/Time   CALCIUM  9.4 10/29/2023 1851   ALKPHOS 37 (L) 10/29/2023 1851   AST 17 10/29/2023 1851   AST 17 10/19/2023 1329   ALT 12 10/29/2023 1851   ALT 9 10/19/2023 1329   BILITOT 0.8 10/29/2023 1851   BILITOT 0.3 10/19/2023 1329       Impression and Plan: [name] is a very pleasant 89 yo caucasian female with iron deficiency anemia secondary to GI blood loss and mild thrombocytopenia.   CBC counts remain stable at this time.  Iron studies are pending. We will replace if needed.  Follow-up in another 4 months.   Kennard Pea, NP 4/28/20252:00 PM

## 2024-02-18 NOTE — Progress Notes (Unsigned)
 Subjective:    Patient ID: [name], female    DOB: 01/21/1935, 89 y.o.   MRN: [id number]  DOS:  02/18/2024 Type of visit - description: Routine checkup, here with her son  Since the last office visit went to the ER 12/25/2023, diagnosed with a UTI.  Is seen regularly at the wound care center, was seen 12/28/2023  C/o posterior neck pain, "for a while", increasing lately. Pain was not related with any fall.  Does not radiate beyond the trapezoid area.  Denies upper extremity paresthesias.  Also about 4 weeks ago the son noted some redness at the right BKA stump, now the skin is affected as well. No fever or chills, no swelling, no discharge.   Review of Systems See above   Past Medical History:  Diagnosis Date   Allergic rhinitis    Arthritis    "back; ankles; hands; knees" (10/31/2013)   Bilateral sensorineural hearing loss    CAD (coronary artery disease)    a. Nonobst in 2011. b. Abnormal nuc 09/2013 -> s/p cutting balloon to D2, mild LAD disease; c. 08/2015 MV: no ischemia, EF 71%.   Carcinoma in situ in a polyp 1994   a. 1994 - malignant polyp removed during colonoscopy.   Chronic diastolic CHF (congestive heart failure) (HCC)    a. 09/2013 Echo: EF 60-65%, mild LVH, Gr1 DD.   Dementia (HCC)    Diverticulosis    DJD (degenerative joint disease)    Fatty liver    GERD (gastroesophageal reflux disease)    a. Hx GERD/esophageal dysmotility followed by Dr. Grandville Lax.    Hemorrhoids    Hiatal hernia    a. s/p Nissen fundoplication 2011.   Hyperlipidemia    a. patient unwilling to use statins.   Hypertensive heart disease    IBS (irritable bowel syndrome)    Macular degeneration 03/2009   Dr. Nolon Baxter   Neuropathy    a. Hands, feet, legs.   Orthostatic hypotension    Osteomyelitis (HCC)    a. 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 01/2016 s/p RLE transtibial amputation 2/2 Charcot rocker-bottom deformity and  insensate neuropathy ulceration.   Osteoporosis    PE (pulmonary embolism) 12/2007   a. PE/DVT after neck surgery 2009. b. coumadin  d/c 10-2008.   PONV (postoperative nausea and vomiting) 2009   neck surgery   PPD positive    Recurrent UTI    RSD (reflex sympathetic dystrophy)    a. Chronic pain.   Spinal stenosis    Type II diabetes mellitus (HCC)    no on medication   Venous insufficiency    a. Contributing to LEE.   Wound of left foot     Past Surgical History:  Procedure Laterality Date   AMPUTATION  03/06/2012   Procedure: AMPUTATION FOOT;  Surgeon: Timothy Ford, MD;  Location: [facility] OR;  Service: Orthopedics;  Laterality: Left;  FIFTH RAY AMPUTATION    AMPUTATION Right 02/18/2016   Procedure: AMPUTATION BELOW KNEE;  Surgeon: Timothy Ford, MD;  Location: [facility] OR;  Service: Orthopedics;  Laterality: Right;   AMPUTATION Left 11/22/2016   Procedure: Amputation 4th Toe Left Foot at Metatarsophalangeal Joint;  Surgeon: Timothy Ford, MD;  Location: [facility] OR;  Service: Orthopedics;  Laterality: Left;   ANKLE FUSION  09/27/2012   Procedure: ANKLE FUSION;  Surgeon: Timothy Ford, MD;  Location: [facility] OR;  Service: Orthopedics;  Laterality: Left;  Left Tibiocalcaneal Fusion  ANKLE FUSION Right 05/09/2013   Procedure: ANKLE FUSION;  Surgeon: Timothy Ford, MD;  Location: [facility] OR;  Service: Orthopedics;  Laterality: Right;  Excision Osteomyelitis Base 1st MT Right Foot, Fusion Medial Column   ANTERIOR CERVICAL DECOMP/DISCECTOMY FUSION  12/18/07   For OA,  Dr. Murrel Arnt:  fu by a PE   ANTERIOR CERVICAL DECOMP/DISCECTOMY FUSION N/A 11/29/2020   Procedure: Anterior Cervical Decompression Fusion - Cervical Three-Cerivcal Four;  Surgeon: Agustina Aldrich, MD;  Location: [facility] OR;  Service: Neurosurgery;  Laterality: N/A;  3C   CARDIAC CATHETERIZATION  05/2010    at [facility] TUNNEL RELEASE Left 09/2011   CATARACT EXTRACTION W/ INTRAOCULAR LENS  IMPLANT, BILATERAL  2004   feb 2004 left, aug 2004 right    CHOLECYSTECTOMY  03/2002   CORONARY ANGIOPLASTY  10/31/2013   CORONARY ARTERY BYPASS GRAFT  09/27/2016   CYST REMOVAL HAND  06/2003   FOOT BONE EXCISION Right 06/2009   LAPAROSCOPIC RIGHT HEMI COLECTOMY N/A 01/08/2015   Procedure: LAPAROSCOPIC ASSISTED RIGHT HEMI COLECTOMY;  Surgeon: Jacolyn Matar, MD;  Location: [facility] ORS;  Service: General;  Laterality: N/A;   LEFT HEART CATHETERIZATION WITH CORONARY ANGIOGRAM N/A 10/31/2013   Procedure: LEFT HEART CATHETERIZATION WITH CORONARY ANGIOGRAM;  Surgeon: Lucendia Rusk, MD;  Location: [facility] CATH LAB;  Service: Cardiovascular;  Laterality: N/A;   NASAL SEPTUM SURGERY  09/1963   NISSEN FUNDOPLICATION  01/25/2010   PERCUTANEOUS CORONARY INTERVENTION-BALLOON ONLY  10/31/2013   Procedure: PERCUTANEOUS CORONARY INTERVENTION-BALLOON ONLY;  Surgeon: Lucendia Rusk, MD;  Location: [facility] CATH LAB;  Service: Cardiovascular;;   ROTATOR CUFF REPAIR Right 07/2007   Dr. Thomasena Fleming CUFF REPAIR Left 11/2004   stent in left leg, behind knee  06/27/2017   x2, done at [facility] cardiology in [facility]   TOE AMPUTATION Right 08/2008   3rd toe, Dr. Julio Ohm due to osteomyelitis   VAGINAL HYSTERECTOMY  03/1975   VEIN LIGATION Bilateral 03/1966   VENA CAVA FILTER PLACEMENT  01/2010   green filter; "due to blood clots"   WISDOM TOOTH EXTRACTION  06/2007    Current Outpatient Medications  Medication Instructions   Alpha-Lipoic Acid 1,200 mg, Daily   amitriptyline  (ELAVIL ) 25 mg, Oral, Daily at bedtime   atorvastatin  (LIPITOR) 40 mg, Daily   carvedilol  (COREG ) 12.5 mg, 2 times daily with meals   CINNAMON PO 2,000 mg, Daily   clopidogrel  (PLAVIX ) 75 mg, Oral, Daily at bedtime, Restart on 12/04/2020   Cranberry 200 MG CAPS    dextromethorphan-guaiFENesin (TUSSIN DM) 10-100 MG/5ML liquid Every 4 hours PRN   diclofenac  Sodium (VOLTAREN ) 4 g, Topical, 4 times daily   dicyclomine  (BENTYL ) 10 MG capsule TAKE 1 CAPSULE BY MOUTH THREE TIMES DAILY BEFORE MEALS   donepezil   (ARICEPT ) 10 mg, Oral, Daily at bedtime   famotidine  (PEPCID ) 20 mg, Oral, 2 times daily   furosemide  (LASIX ) 20 mg, Daily   glucose blood (FREESTYLE LITE) test strip USE TO CHECK BLOOD SUGAR NO MORE THAN TWICE DAILY   HYDROcodone -acetaminophen  (NORCO) 10-325 MG tablet 1 tablet, Oral, Every 6 hours PRN   hydrocortisone  (ANUSOL -HC) 25 mg, Rectal, 2 times daily, As needed   hydrocortisone  2.5 % cream APPLY TOPICALLY TO THE AFFECTED AREA TWICE DAILY   hydrocortisone -pramoxine (ANALPRAM-HC) 2.5-1 % rectal cream APPLY RECTALLY TO THE AFFECTED AREA IN THE MORNING AND AT BEDTIME AS NEEDED   ketoconazole  (NIZORAL ) 2 % cream 1 Application, Topical, 2 times daily   Lancets (FREESTYLE) lancets  CHECK BLOOD SUGAR TWICE DAILY   linaclotide  (LINZESS ) 72 mcg, Oral, Daily before breakfast   loperamide  (IMODIUM  A-D) 2 mg, As needed   MegaRed Omega-3 Krill Oil 500 MG CAPS Restart on 12/04/2020   methenamine  (HIPREX ) 1 g tablet 0.5 tablets, Daily   Multiple Vitamins-Minerals (PRESERVISION AREDS 2) CAPS 1 capsule, 2 times daily   naloxone  (NARCAN ) nasal spray 4 mg/0.1 mL 1 spray, Nasal, Once PRN   nitroGLYCERIN  (NITROSTAT ) 0.4 mg, Sublingual, Every 5 min x3 PRN   omeprazole  (PRILOSEC) 20 mg, Oral, Daily, As needed   ondansetron  (ZOFRAN ) 4 mg, Oral, Every 8 hours PRN   OVER THE COUNTER MEDICATION 1 tablet, 3 times daily   Polyvinyl Alcohol -Povidone (REFRESH OP) 1 drop, 3 times daily PRN   pramipexole  (MIRAPEX ) 0.125-0.25 mg, Oral, At bedtime PRN   Prenatal Vit-Fe Fumarate-FA (PRENATAL MULTIVITAMIN) TABS tablet 1 tablet, Daily   Probiotic Product (PROBIOTIC PEARLS PO) 1 tablet, Daily   rivaroxaban  (XARELTO ) 2.5 mg, Oral, 2 times daily, Restart on 12/04/2020   silver  sulfADIAZINE  (SILVADENE ) 1 % cream 1 application , Topical, Daily   Vitamin B-12 5,000 mcg, Every other day   vitamin C with rose hips 500 mg, 2 times daily   Vitamin D3 5,000 Units, Every evening   vitamin E  400 Units, Daily   Zinc Sulfate 66 MG  TABS        Objective:   Physical Exam BP 128/78   Pulse 73   Temp 97.8 F (36.6 C) (Oral)   Resp 18   Ht 5\' 4"  (1.626 m)   Wt 172 lb 2 oz (78.1 kg)   SpO2 94%   BMI 29.55 kg/m  General:   Well developed, NAD, BMI noted. HEENT:  Normocephalic . Face symmetric, atraumatic Neck:  Slightly TTP at the cervical spine. ROM decreased, mostly unable to get a good neck extension. Lungs:  CTA B Normal respiratory effort, no intercostal retractions, no accessory muscle use. Heart: RRR,  no murmur.  Lower extremities: no pretibial edema bilaterally  Skin: Skin at the right BKA stump not seen, she has the prosthetics on Neurologic:  alert & oriented X3.  Speech normal, gait not tested.   Psych--    Behavior appropriate. No anxious or depressed appearing.      Assessment     ASSESSMENT DM, Neuropathy, amputations due to Charcot feet. - metformin  intolerant  (lethargic) HTN  Hyperlipidemia CV: ---CAD cardiologist in [facility] --NSTEMI 09-2016. Outside hospital: Cath 2 vessel disease, CABG 2, LIMA to LAD and Diag   ---Chronic diastolic CHF ---PVD: Multiple procedures per Dr Misty Amour, ([place] ) ---May 2023: Cardiology rec continue Plavix  and Xarelto , stop aspirin . ---  IVC filter seen on CXR  09/23/2020 GI: GERD, IBS, colon polyps, PUD, abdominal pain "post polypectomy syndrome" naueas meds prn (antivert -zofran , gets nausea when car traveling) Osteoporosis  MSK,pain mngmt : --Reflex sympathetic dystrophy --Neuropathy --DJD --Spinal stenosis  --H/o Osteomyelitis  Foot --s/p amputation:  4th 5th L toes , R BKA  (01-2016, dx osteomyelitis) w/ phantom pain  -- sees Dr Julio Ohm prn She took oxycodone  after surgery and that did NOT seem to help better than hydrocodone . Intolerant to Cymbalta, Neurontin ; Lyrica helped but caused edema. Recurrent UTIs: Dr Derrick Fling  Venous insufficiency Dementia: MMSE 23 (January 2023) H/o  + PPD H/o hypothyroidism: TSHs stable H/o dyspnea   LUNG MASS -incidental per CT 2009,  PET scan 01-2008: likely  benign, CT 08-2009 no change, no further CTs per Dr Bennetta Braun - Had a CT  in [place]  09-2016 "spiculated mass", saw Dr Baldwin Levee,  CT  09/2017 stable   PLAN:  DM: Diet controlled, check A1c HTN: BP looks good, continue carvedilol , Lasix . High cholesterol: Recheck FLP, continue atorvastatin . Increased creatinine: Her creatinine has increased, currently 1.4, not on NSAIDs, will recheck BMP on RTC, recommend good hydration. Hypocalcemia: Per chart review, resolved Recurrent UTI: Was treated at the ED 12/25/2023, currently with denies any LUTS specifically no gross hematuria.  Will try to get records. Pain management: Typically one hydrocodone  twice a day plus Tylenol . Skin breakdown: At the stump of the right BKA, I have not remove the prosthesis today, recommend to be seen immediately if they notice discharge, fever chills or swelling otherwise encouraged to discuss these ASAP with the wound care center she has a relationship with or with the prosthesis provider.  The patient's son understand and agreed. Neck pain: As described above, we will start by taking a  x-ray, continue hydrocodone , Tylenol , some plans to place some Voltaren  gel which is a good idea.  Seek immediate attention if intense pain, radiation etc. RTC 3 months

## 2024-02-19 LAB — IRON AND IRON BINDING CAPACITY (CC-WL,HP ONLY)
Iron: 87 ug/dL (ref 28–170)
Saturation Ratios: 27 % (ref 10.4–31.8)
TIBC: 318 ug/dL (ref 250–450)
UIBC: 231 ug/dL (ref 148–442)

## 2024-02-19 LAB — LIPID PANEL
Cholesterol: 96 mg/dL (ref 0–200)
HDL: 47.7 mg/dL (ref >39.00)
LDL Cholesterol: 29 mg/dL (ref 0–99)
NonHDL: 48.72
Total CHOL/HDL Ratio: 2
Triglycerides: 97 mg/dL (ref 0.0–149.0)
VLDL: 19.4 mg/dL (ref 0.0–40.0)

## 2024-02-19 LAB — HEMOGLOBIN A1C: Hgb A1c MFr Bld: 5.3 % (ref 4.6–6.5)

## 2024-02-19 NOTE — Assessment & Plan Note (Signed)
 DM: Diet controlled, check A1c HTN: BP looks good, continue carvedilol , Lasix . High cholesterol: Recheck FLP, continue atorvastatin . Increased creatinine: Her creatinine has increased, currently 1.4, not on NSAIDs, will recheck BMP on RTC, recommend good hydration. Hypocalcemia: Per chart review, resolved Recurrent UTI: Was treated at the ED 12/25/2023, currently with denies any LUTS specifically no gross hematuria.  Will try to get records. Pain management: Typically one hydrocodone  twice a day plus Tylenol . Skin breakdown: At the stump of the right BKA, I have not remove the prosthesis today, recommend to be seen immediately if they notice discharge, fever chills or swelling otherwise encouraged to discuss this ASAP with the wound care center she has a relationship with or with the prosthesis provider.  The patient's son understand and agreed. Neck pain: As described above, we will start by taking a  x-ray, continue hydrocodone , Tylenol , some plans to place some Voltaren  gel which is a good idea.  Seek immediate attention if intense pain, radiation etc. RTC 3 months

## 2024-02-21 DIAGNOSIS — I70245 Atherosclerosis of native arteries of left leg with ulceration of other part of foot: Secondary | ICD-10-CM | POA: Diagnosis not present

## 2024-02-21 DIAGNOSIS — S91102D Unspecified open wound of left great toe without damage to nail, subsequent encounter: Secondary | ICD-10-CM | POA: Diagnosis not present

## 2024-02-21 LAB — DRUG MONITORING PANEL 375977 , URINE
Alcohol Metabolites: NEGATIVE ng/mL (ref <500)
Amphetamines: NEGATIVE ng/mL (ref <500)
Barbiturates: NEGATIVE ng/mL (ref <300)
Benzodiazepines: NEGATIVE ng/mL (ref <100)
Cocaine Metabolite: NEGATIVE ng/mL (ref <150)
Codeine: NEGATIVE ng/mL (ref <50)
Desmethyltramadol: NEGATIVE ng/mL (ref <100)
Hydrocodone: 783 ng/mL — ABNORMAL HIGH (ref <50)
Hydromorphone: 194 ng/mL — ABNORMAL HIGH (ref <50)
Marijuana Metabolite: NEGATIVE ng/mL (ref <20)
Morphine: NEGATIVE ng/mL (ref <50)
Norhydrocodone: 1095 ng/mL — ABNORMAL HIGH (ref <50)
Opiates: POSITIVE ng/mL — AB (ref <100)
Oxycodone: NEGATIVE ng/mL (ref <100)
Tramadol: NEGATIVE ng/mL (ref <100)

## 2024-02-21 LAB — DM TEMPLATE

## 2024-02-22 ENCOUNTER — Encounter: Payer: Self-pay | Admitting: Internal Medicine

## 2024-02-28 DIAGNOSIS — S91102D Unspecified open wound of left great toe without damage to nail, subsequent encounter: Secondary | ICD-10-CM | POA: Diagnosis not present

## 2024-02-28 DIAGNOSIS — I70245 Atherosclerosis of native arteries of left leg with ulceration of other part of foot: Secondary | ICD-10-CM | POA: Diagnosis not present

## 2024-03-03 ENCOUNTER — Encounter: Payer: Self-pay | Admitting: Internal Medicine

## 2024-03-05 DIAGNOSIS — R300 Dysuria: Secondary | ICD-10-CM | POA: Diagnosis not present

## 2024-03-05 DIAGNOSIS — N3000 Acute cystitis without hematuria: Secondary | ICD-10-CM | POA: Diagnosis not present

## 2024-03-05 DIAGNOSIS — Z1331 Encounter for screening for depression: Secondary | ICD-10-CM | POA: Diagnosis not present

## 2024-03-05 DIAGNOSIS — F0390 Unspecified dementia without behavioral disturbance: Secondary | ICD-10-CM | POA: Diagnosis not present

## 2024-03-05 DIAGNOSIS — Z1389 Encounter for screening for other disorder: Secondary | ICD-10-CM | POA: Diagnosis not present

## 2024-03-06 DIAGNOSIS — I890 Lymphedema, not elsewhere classified: Secondary | ICD-10-CM | POA: Diagnosis not present

## 2024-03-06 DIAGNOSIS — I7025 Atherosclerosis of native arteries of other extremities with ulceration: Secondary | ICD-10-CM | POA: Diagnosis not present

## 2024-03-06 DIAGNOSIS — I70245 Atherosclerosis of native arteries of left leg with ulceration of other part of foot: Secondary | ICD-10-CM | POA: Diagnosis not present

## 2024-03-06 DIAGNOSIS — I70244 Atherosclerosis of native arteries of left leg with ulceration of heel and midfoot: Secondary | ICD-10-CM | POA: Diagnosis not present

## 2024-03-06 DIAGNOSIS — M79605 Pain in left leg: Secondary | ICD-10-CM | POA: Diagnosis not present

## 2024-03-06 DIAGNOSIS — I10 Essential (primary) hypertension: Secondary | ICD-10-CM | POA: Diagnosis not present

## 2024-03-11 DIAGNOSIS — I10 Essential (primary) hypertension: Secondary | ICD-10-CM | POA: Diagnosis not present

## 2024-03-11 DIAGNOSIS — E119 Type 2 diabetes mellitus without complications: Secondary | ICD-10-CM | POA: Diagnosis not present

## 2024-03-11 DIAGNOSIS — I25728 Atherosclerosis of autologous artery coronary artery bypass graft(s) with other forms of angina pectoris: Secondary | ICD-10-CM | POA: Diagnosis not present

## 2024-03-11 DIAGNOSIS — E782 Mixed hyperlipidemia: Secondary | ICD-10-CM | POA: Diagnosis not present

## 2024-03-13 DIAGNOSIS — E1152 Type 2 diabetes mellitus with diabetic peripheral angiopathy with gangrene: Secondary | ICD-10-CM | POA: Diagnosis not present

## 2024-03-13 DIAGNOSIS — I7025 Atherosclerosis of native arteries of other extremities with ulceration: Secondary | ICD-10-CM | POA: Diagnosis not present

## 2024-03-13 DIAGNOSIS — I70213 Atherosclerosis of native arteries of extremities with intermittent claudication, bilateral legs: Secondary | ICD-10-CM | POA: Diagnosis not present

## 2024-03-13 DIAGNOSIS — E785 Hyperlipidemia, unspecified: Secondary | ICD-10-CM | POA: Diagnosis not present

## 2024-03-20 DIAGNOSIS — S91102D Unspecified open wound of left great toe without damage to nail, subsequent encounter: Secondary | ICD-10-CM | POA: Diagnosis not present

## 2024-03-20 DIAGNOSIS — I70245 Atherosclerosis of native arteries of left leg with ulceration of other part of foot: Secondary | ICD-10-CM | POA: Diagnosis not present

## 2024-03-21 ENCOUNTER — Telehealth: Payer: Self-pay | Admitting: Gastroenterology

## 2024-03-21 DIAGNOSIS — Z1231 Encounter for screening mammogram for malignant neoplasm of breast: Secondary | ICD-10-CM | POA: Diagnosis not present

## 2024-03-21 LAB — HM MAMMOGRAPHY

## 2024-03-21 NOTE — Telephone Encounter (Signed)
 Patient daughter called and stated that her mother has been having intermediate pain on and off in her lower abdomin. Patient daughter stated that she spoke with her brother who is caring for her mother and they believe due to her history she might have a bowel blockage. Patient daughter also stated that her mother has been feeling nauseous as well. Patient daughter is wondering Dr. Nandigam well be able to go see her mother at the hospital. Patient daughter is requesting a call back. Please advise.

## 2024-03-21 NOTE — Telephone Encounter (Signed)
 Called and spoke with patient's daughter. She reports that for a couple of weeks her mom has been having intermittent RLQ abdominal pain, tender upon palpation, nausea, and diarrhea. States she doesn't feel that it is necessary to go to the ER at this time, but she would like to get her in with a provider ASAP. Appointment scheduled for 03/24/2024 at 10 AM with Deanna as Dr. Leonia Raman is out of office. Daughter is Char[employer] fundraiser and states that if her mother gets worse prior to appointment, she will take her to ER for further evaluation.

## 2024-03-24 ENCOUNTER — Ambulatory Visit (INDEPENDENT_AMBULATORY_CARE_PROVIDER_SITE_OTHER)
Admission: RE | Admit: 2024-03-24 | Discharge: 2024-03-24 | Disposition: A | Discharge status: Home / Self Care | Source: Ambulatory Visit | Attending: Gastroenterology | Admitting: Gastroenterology

## 2024-03-24 ENCOUNTER — Telehealth: Payer: Self-pay | Admitting: Internal Medicine

## 2024-03-24 ENCOUNTER — Ambulatory Visit (INDEPENDENT_AMBULATORY_CARE_PROVIDER_SITE_OTHER): Admitting: Gastroenterology

## 2024-03-24 ENCOUNTER — Encounter: Payer: Self-pay | Admitting: Gastroenterology

## 2024-03-24 ENCOUNTER — Ambulatory Visit: Payer: Self-pay | Admitting: Gastroenterology

## 2024-03-24 DIAGNOSIS — R110 Nausea: Secondary | ICD-10-CM | POA: Diagnosis not present

## 2024-03-24 DIAGNOSIS — K649 Unspecified hemorrhoids: Secondary | ICD-10-CM

## 2024-03-24 DIAGNOSIS — K5900 Constipation, unspecified: Secondary | ICD-10-CM | POA: Diagnosis not present

## 2024-03-24 DIAGNOSIS — R194 Change in bowel habit: Secondary | ICD-10-CM

## 2024-03-24 NOTE — Telephone Encounter (Signed)
 Copied from CRM [id number]. Topic: Clinical - Medication Refill >> Mar 24, 2024  4:17 PM Earnestine Goes B wrote: Medication: HYDROcodone -acetaminophen  (NORCO) 10-325 MG tablet  Has the patient contacted their pharmacy? Yes (Agent: If no, request that the patient contact the pharmacy for the refill. If patient does not wish to contact the pharmacy document the reason why and proceed with request.) (Agent: If yes, when and what did the pharmacy advise?)  This is the patient's preferred pharmacy:    [id number] [place], [place] - [address] AT [facility] OF [address] [facility] [address] & [facility] [address] [place] [address] Phone: [phone] Fax: [phone]    Is this the correct pharmacy for this prescription? Yes If no, delete pharmacy and type the correct one.   Has the prescription been filled recently? Yes  Is the patient out of the medication? Yes  Has the patient been seen for an appointment in the last year OR does the patient have an upcoming appointment? Yes  Can we respond through MyChart? Yes  Agent: Please be advised that Rx refills may take up to 3 business days. We ask that you follow-up with your pharmacy.

## 2024-03-24 NOTE — Progress Notes (Signed)
 Chief Complaint: RLQabdominal pain, nausea, diarrhea Primary GI Doctor:Dr. Leonia Raman  HPI: 89 year old very pleasant female with history of CAD, and STEMI status post CABG, peripheral vascular disease status post partial amputation of left lower leg with prosthesis on chronic antiplatelet and anticoagulation.   Last seen by Dr. Johnathan Myron on 08/14/23 with complaints of irregular bowel habits with alternating constipation/diarrhea, intermittent rectal bleeding and iron deficiency anemia.  GI Hx: DG Abd view 2 11-16-21  1. Postoperative changes, as described above, without evidence of bowel obstruction.   CT Abdomen Pelvis w contrast 03-19-21 -Postsurgical changes in the right abdomen consistent with right hemicolectomy and ileocolic anastomosis. The appendix is surgically removed. -Diverticulosis without evidence of diverticulitis. -Changes of prior Nissen fundoplication. -IVC filter and left common iliac vein stent are noted. She is s/p hemorrhoidal band ligation in 2017.  She was evaluated by [facility] for hemorrhoidectomy, was thought not to be a candidate for surgical repair.   CT abdomen and pelvis with contrast 12/24/2015 1. No acute abdominopelvic findings. 2. Colonic diverticulosis without evidence of acute diverticulitis. 3. Single bubble of air within the urinary bladder. Correlate for history of recent instrumentation. 4. Postsurgical changes at the gastroesophageal junction from Nissen fundoplication with small recurrent hiatal hernia.   Colonoscopy March 2017 with internal and external hemorrhoids, left-sided diverticulosis, removal of 3 subcentimeter sessile and pedunculated polyps [tubular adenomas]   Interval History    Patient presents for follow-up, accompanied by her son. Patient is poor historian. He reports that for a couple of weeks his mom has been having intermittent RLQ/LLQ abdominal pain, tender upon palpation, nausea, and diarrhea. Her son states she  takes Linzess  72 mcg prn not daily, last time he gave it to her was 2 weeks ago. He states when he gives her the Linzess  she complains of diarrhea several days and then she ends up getting OTC Imodium . He notes intermittent rectal bleeding from internal hemorrhoids. She also uses dicyclomine  prn for abdominal pain, typically once per day. Son states he does not note any change in her pain after taking dicyclomine  She also has intermittent nausea and he states she is eating less.  She is in wheelchair and needs x1 assist. She has boot on left foot so transferring is difficult.    Wt Readings from Last 3 Encounters:  02/18/24 172 lb 2 oz (78.1 kg)  10/29/23 156 lb 1.4 oz (70.8 kg)  10/19/23 156 lb (70.8 kg)   Per son her weight 3 weeks ago was 172lbs.  Past Medical History:  Diagnosis Date   Allergic rhinitis    Arthritis    "back; ankles; hands; knees" (10/31/2013)   Bilateral sensorineural hearing loss    CAD (coronary artery disease)    a. Nonobst in 2011. b. Abnormal nuc 09/2013 -> s/p cutting balloon to D2, mild LAD disease; c. 08/2015 MV: no ischemia, EF 71%.   Carcinoma in situ in a polyp 1994   a. 1994 - malignant polyp removed during colonoscopy.   Chronic diastolic CHF (congestive heart failure) (HCC)    a. 09/2013 Echo: EF 60-65%, mild LVH, Gr1 DD.   Dementia (HCC)    Diverticulosis    DJD (degenerative joint disease)    Fatty liver    GERD (gastroesophageal reflux disease)    a. Hx GERD/esophageal dysmotility followed by Dr. Grandville Lax.    Hemorrhoids    Hiatal hernia    a. s/p Nissen fundoplication 2011.   Hyperlipidemia    a. patient unwilling to use statins.  Hypertensive heart disease    IBS (irritable bowel syndrome)    Macular degeneration 03/2009   Dr. Nolon Baxter   Neuropathy    a. Hands, feet, legs.   Orthostatic hypotension    Osteomyelitis (HCC)    a. 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 01/2016 s/p RLE  transtibial amputation 2/2 Charcot rocker-bottom deformity and insensate neuropathy ulceration.   Osteoporosis    PE (pulmonary embolism) 12/2007   a. PE/DVT after neck surgery 2009. b. coumadin  d/c 10-2008.   PONV (postoperative nausea and vomiting) 2009   neck surgery   PPD positive    Recurrent UTI    RSD (reflex sympathetic dystrophy)    a. Chronic pain.   Spinal stenosis    Type II diabetes mellitus (HCC)    no on medication   Venous insufficiency    a. Contributing to LEE.   Wound of left foot     Past Surgical History:  Procedure Laterality Date   AMPUTATION  03/06/2012   Procedure: AMPUTATION FOOT;  Surgeon: Timothy Ford, MD;  Location: [facility] OR;  Service: Orthopedics;  Laterality: Left;  FIFTH RAY AMPUTATION    AMPUTATION Right 02/18/2016   Procedure: AMPUTATION BELOW KNEE;  Surgeon: Timothy Ford, MD;  Location: [facility] OR;  Service: Orthopedics;  Laterality: Right;   AMPUTATION Left 11/22/2016   Procedure: Amputation 4th Toe Left Foot at Metatarsophalangeal Joint;  Surgeon: Timothy Ford, MD;  Location: [facility] OR;  Service: Orthopedics;  Laterality: Left;   ANKLE FUSION  09/27/2012   Procedure: ANKLE FUSION;  Surgeon: Timothy Ford, MD;  Location: [facility] OR;  Service: Orthopedics;  Laterality: Left;  Left Tibiocalcaneal Fusion   ANKLE FUSION Right 05/09/2013   Procedure: ANKLE FUSION;  Surgeon: Timothy Ford, MD;  Location: [facility] OR;  Service: Orthopedics;  Laterality: Right;  Excision Osteomyelitis Base 1st MT Right Foot, Fusion Medial Column   ANTERIOR CERVICAL DECOMP/DISCECTOMY FUSION  12/18/07   For OA,  Dr. Murrel Arnt:  fu by a PE   ANTERIOR CERVICAL DECOMP/DISCECTOMY FUSION N/A 11/29/2020   Procedure: Anterior Cervical Decompression Fusion - Cervical Three-Cerivcal Four;  Surgeon: Agustina Aldrich, MD;  Location: [facility] OR;  Service: Neurosurgery;  Laterality: N/A;  3C   CARDIAC CATHETERIZATION  05/2010    at [facility] TUNNEL RELEASE Left 09/2011   CATARACT EXTRACTION W/ INTRAOCULAR LENS   IMPLANT, BILATERAL  2004   feb 2004 left, aug 2004 right   CHOLECYSTECTOMY  03/2002   CORONARY ANGIOPLASTY  10/31/2013   CORONARY ARTERY BYPASS GRAFT  09/27/2016   CYST REMOVAL HAND  06/2003   FOOT BONE EXCISION Right 06/2009   LAPAROSCOPIC RIGHT HEMI COLECTOMY N/A 01/08/2015   Procedure: LAPAROSCOPIC ASSISTED RIGHT HEMI COLECTOMY;  Surgeon: Jacolyn Matar, MD;  Location: [facility] ORS;  Service: General;  Laterality: N/A;   LEFT HEART CATHETERIZATION WITH CORONARY ANGIOGRAM N/A 10/31/2013   Procedure: LEFT HEART CATHETERIZATION WITH CORONARY ANGIOGRAM;  Surgeon: Lucendia Rusk, MD;  Location: [facility] CATH LAB;  Service: Cardiovascular;  Laterality: N/A;   NASAL SEPTUM SURGERY  09/1963   NISSEN FUNDOPLICATION  01/25/2010   PERCUTANEOUS CORONARY INTERVENTION-BALLOON ONLY  10/31/2013   Procedure: PERCUTANEOUS CORONARY INTERVENTION-BALLOON ONLY;  Surgeon: Lucendia Rusk, MD;  Location: [facility] CATH LAB;  Service: Cardiovascular;;   ROTATOR CUFF REPAIR Right 07/2007   Dr. Thomasena Fleming CUFF REPAIR Left 11/2004   stent in left leg, behind knee  06/27/2017  x2, done at [facility] in [facility]   TOE AMPUTATION Right 08/2008   3rd toe, Dr. Julio Ohm due to osteomyelitis   VAGINAL HYSTERECTOMY  03/1975   VEIN LIGATION Bilateral 03/1966   VENA CAVA FILTER PLACEMENT  01/2010   green filter; "due to blood clots"   WISDOM TOOTH EXTRACTION  06/2007    Current Outpatient Medications  Medication Sig Dispense Refill   Alpha-Lipoic Acid 600 MG CAPS Take 1,200 mg by mouth daily.     amitriptyline  (ELAVIL ) 25 MG tablet Take 1 tablet (25 mg total) by mouth at bedtime. 90 tablet 1   Ascorbic Acid  (VITAMIN C WITH ROSE HIPS) 500 MG tablet Take 500 mg by mouth in the morning and at bedtime.     atorvastatin  (LIPITOR) 40 MG tablet Take 40 mg by mouth daily.     carvedilol  (COREG ) 12.5 MG tablet Take 12.5 mg by mouth 2 (two) times daily with a meal.     Cholecalciferol  (VITAMIN D3) 5000 UNITS CAPS Take 5,000 Units by  mouth every evening. Reported on 11/24/2015     CINNAMON PO Take 2,000 mg by mouth daily.     clopidogrel  (PLAVIX ) 75 MG tablet Take 1 tablet (75 mg total) by mouth at bedtime. Restart on 12/04/2020     Cranberry 200 MG CAPS      Cyanocobalamin  (VITAMIN B-12) 5000 MCG SUBL Place 5,000 mcg under the tongue every other day.     diclofenac  Sodium (VOLTAREN ) 1 % GEL Apply 4 g topically 4 (four) times daily. (Patient taking differently: Apply 4 g topically 4 (four) times daily. If needed for pain) 100 g 0   dicyclomine  (BENTYL ) 10 MG capsule TAKE 1 CAPSULE BY MOUTH THREE TIMES DAILY BEFORE MEALS 270 capsule 2   donepezil  (ARICEPT ) 10 MG tablet Take 1 tablet (10 mg total) by mouth at bedtime. 90 tablet 3   famotidine  (PEPCID ) 20 MG tablet Take 1 tablet (20 mg total) by mouth 2 (two) times daily. 180 tablet 1   furosemide  (LASIX ) 20 MG tablet Take 20 mg by mouth daily.     glucose blood (FREESTYLE LITE) test strip USE TO CHECK BLOOD SUGAR NO MORE THAN TWICE DAILY 200 each 12   HYDROcodone -acetaminophen  (NORCO) 10-325 MG tablet Take 1 tablet by mouth every 6 (six) hours as needed. 120 tablet 0   hydrocortisone  (ANUSOL -HC) 25 MG suppository Place 1 suppository (25 mg total) rectally 2 (two) times daily. As needed 24 suppository 11   hydrocortisone  2.5 % cream APPLY TOPICALLY TO THE AFFECTED AREA TWICE DAILY 30 g 1   hydrocortisone -pramoxine (ANALPRAM-HC) 2.5-1 % rectal cream APPLY RECTALLY TO THE AFFECTED AREA IN THE MORNING AND AT BEDTIME AS NEEDED 30 g 0   ketoconazole  (NIZORAL ) 2 % cream Apply 1 Application topically 2 (two) times daily. 60 g 0   Lancets (FREESTYLE) lancets CHECK BLOOD SUGAR TWICE DAILY 200 each 12   linaclotide  (LINZESS ) 72 MCG capsule Take 1 capsule (72 mcg total) by mouth daily before breakfast. 90 capsule 3   loperamide  (IMODIUM  A-D) 2 MG tablet Take 2 mg by mouth as needed for diarrhea or loose stools.     MegaRed Omega-3 Krill Oil 500 MG CAPS Restart on 12/04/2020     methenamine   (HIPREX ) 1 g tablet Take 0.5 tablets by mouth daily.  4   Multiple Vitamins-Minerals (PRESERVISION AREDS 2) CAPS Take 1 capsule by mouth 2 (two) times daily.     naloxone  (NARCAN ) nasal spray 4 mg/0.1 mL Place  1 spray into the nose once as needed for up to 1 dose. 2 each 0   nitroGLYCERIN  (NITROSTAT ) 0.4 MG SL tablet Place 1 tablet (0.4 mg total) under the tongue every 5 (five) minutes x 3 doses as needed for chest pain. 25 tablet 3   omeprazole  (PRILOSEC) 20 MG capsule Take 1 capsule (20 mg total) by mouth daily. As needed 90 capsule 3   ondansetron  (ZOFRAN ) 4 MG tablet Take 1 tablet (4 mg total) by mouth every 8 (eight) hours as needed for nausea or vomiting. 40 tablet 1   OVER THE COUNTER MEDICATION 1 tablet 3 (three) times daily. Berberorine Gluco Defense     Polyvinyl Alcohol -Povidone (REFRESH OP) Place 1 drop into both eyes 3 (three) times daily as needed (dry eyes).      pramipexole  (MIRAPEX ) 0.125 MG tablet Take 1-2 tablets (0.125-0.25 mg total) by mouth at bedtime as needed. 180 tablet 1   Prenatal Vit-Fe Fumarate-FA (PRENATAL MULTIVITAMIN) TABS tablet Take 1 tablet by mouth daily at 12 noon.     Probiotic Product (PROBIOTIC PEARLS PO) Take 1 tablet by mouth daily.     rivaroxaban  (XARELTO ) 2.5 MG TABS tablet Take 1 tablet (2.5 mg total) by mouth 2 (two) times daily. Restart on 12/04/2020 60 tablet    silver  sulfADIAZINE  (SILVADENE ) 1 % cream Apply 1 application topically daily. 50 g 0   vitamin E  180 MG (400 UNITS) capsule Take 400 Units by mouth daily.     Zinc Sulfate 66 MG TABS      Current Facility-Administered Medications  Medication Dose Route Frequency Provider Last Rate Last Admin   [START ON 06/17/2024] denosumab  (PROLIA ) injection 60 mg  60 mg Subcutaneous Q6 months Ezell Hollow, MD        Allergies as of 03/24/2024 - Review Complete 02/18/2024  Allergen Reaction Noted   Metformin  and related Diarrhea, Nausea Only, and Other (See Comments) 12/10/2015   Cymbalta [duloxetine  hcl] Swelling 09/25/2012   Gabapentin Swelling 09/25/2012   Silicone Hives, Itching, Dermatitis, and Rash 04/11/2022   Tramadol Swelling 10/28/2021   Adhesive [tape] Rash 07/17/2011   Cefazolin Hives 03/06/2012   Ciprofloxacin  Other (See Comments) 01/31/2013   Clorazepate dipotassium Other (See Comments)    Dilaudid  [hydromorphone  hcl] Itching 03/11/2010   Doxycycline Rash 11/03/2008   Enablex [darifenacin hydrobromide er] Other (See Comments) 03/06/2012   Levofloxacin Other (See Comments) 01/08/2014   Lyrica [pregabalin] Swelling 05/08/2013   Methadone hcl Other (See Comments)    Morphine  and codeine Other (See Comments) 10/31/2013   Talwin [pentazocine] Other (See Comments) 12/04/2012    Family History  Problem Relation Age of Onset   Heart disease Mother        mitral valve replaced   Arthritis Mother    Diabetic kidney disease Daughter    Diabetes Father    Stroke Father    Migraines Sister    Hypertension Other    Breast cancer Other    Colon cancer Neg Hx    Esophageal cancer Neg Hx    Stomach cancer Neg Hx    Rectal cancer Neg Hx     Review of Systems:    Constitutional: No weight loss, fever, chills, weakness or fatigue HEENT: Eyes: No change in vision               Ears, Nose, Throat:  No change in hearing or congestion Skin: No rash or itching Cardiovascular: No chest pain, chest pressure or palpitations   Respiratory: No  SOB or cough Gastrointestinal: See HPI and otherwise negative Genitourinary: No dysuria or change in urinary frequency Neurological: No headache, dizziness or syncope Musculoskeletal: No new muscle or joint pain Hematologic: No bleeding or bruising Psychiatric: No history of depression or anxiety    Physical Exam:  Vital signs: There were no vitals taken for this visit.  Constitutional:  Pleasant  female appears to be in NAD, Well developed, Well nourished, alert and cooperative Throat: Oral cavity and pharynx without inflammation,  swelling or lesion.  Respiratory: Respirations even and unlabored. Lungs clear to auscultation bilaterally.   No wheezes, crackles, or rhonchi.  Cardiovascular: Normal S1, S2. Regular rate and rhythm. No peripheral edema, cyanosis or pallor.  Gastrointestinal:  Soft, nondistended, nontender. No rebound or guarding. Normal bowel sounds. No appreciable masses or hepatomegaly. Rectal:  Not performed.  Msk:  in wheelchair Neurologic:  Alert and  oriented x4;  grossly normal neurologically.  Skin:   Dry and intact without significant lesions or rashes. Psychiatric: Oriented to person, place and time.   RELEVANT LABS AND IMAGING: CBC    Latest Ref Rng & Units 02/18/2024    1:37 PM 10/29/2023    6:51 PM 10/19/2023    1:29 PM  CBC  WBC 4.0 - 10.5 K/uL 3.9  4.0  3.9   Hemoglobin 12.0 - 15.0 g/dL 46.9  62.9  52.8   Hematocrit 36.0 - 46.0 % 34.7  35.3  36.1   Platelets 150 - 400 K/uL 111  135  114      CMP     Latest Ref Rng & Units 02/18/2024    1:37 PM 10/29/2023    6:51 PM 10/19/2023    1:29 PM  CMP  Glucose 70 - 99 mg/dL 413  89  94   BUN 8 - 23 mg/dL 26  24  23    Creatinine 0.44 - 1.00 mg/dL 2.44  0.10  2.72   Sodium 135 - 145 mmol/L 139  140  139   Potassium 3.5 - 5.1 mmol/L 4.3  4.3  4.5   Chloride 98 - 111 mmol/L 109  109  108   CO2 22 - 32 mmol/L 21  24  25    Calcium  8.9 - 10.3 mg/dL 9.3  9.4  8.0   Total Protein 6.5 - 8.1 g/dL 6.5  6.0  5.9   Total Bilirubin 0.0 - 1.2 mg/dL 0.5  0.8  0.3   Alkaline Phos 38 - 126 U/L 36  37  36   AST 15 - 41 U/L 22  17  17    ALT 0 - 44 U/L 11  12  9       Lab Results  Component Value Date   TSH 1.44 06/22/2023      Assessment: Encounter Diagnoses  Name Primary?   Constipation, unspecified constipation type Yes   Bleeding hemorrhoid    Nausea without vomiting    Altered bowel habits       89 year old female patient that presents with complaints of lower abdominal pain with altered bowel habits.  I explained to the patient's son now  using Linzess  followed by Imodium  is counterproductive and patient will continue to have alternating bowels.  He reports there is concern she [name] have a blockage therefore I will go ahead and order abdominal x-ray KUB two-view stat.  Upon examination abdomen is soft and normal bowel sounds in all 4 quadrants.  I suspect she Lorell Thibodaux be dealing with obstipation and told the son I would give  them a call as soon as I get the results with further instructions.  Patient also has history of iron deficiency anemia and her most recent lab revealed a stable hemoglobin 11.3 and iron level 87 and ferritin 30.  She is not a surgical candidate for hemorrhoid banding, therefore will treat as needed.  Plan: - Order Abdominal xray KUB 2 view STAT, pending results will call son with next steps -hold other medications until results come back  Thank you for the courtesy of this consult. Please call me with any questions or concerns.   Art Levan, FNP-C [facility] 03/24/2024, 12:05 PM  Cc: Ezell Hollow, MD

## 2024-03-24 NOTE — Telephone Encounter (Signed)
 Requesting: hydrocodone  10-325mg   Contract: 11/28/22 UDS: 02/18/24 Last Visit: 02/18/24 Next Visit: 05/27/24 Last Refill: 01/17/24 #120 and 0RF   Please Advise

## 2024-03-24 NOTE — Patient Instructions (Signed)
 Your provider has requested that you go to the basement level for an x ray work before leaving today. Press "B" on the elevator. The lab is located straight ahead.  _______________________________________________________  If your blood pressure at your visit was 140/90 or greater, please contact your primary care physician to follow up on this.  _______________________________________________________  If you are age 87 or older, your body mass index should be between 23-30. Your There is no height or weight on file to calculate BMI. If this is out of the aforementioned range listed, please consider follow up with your Primary Care Provider.  If you are age 32 or younger, your body mass index should be between 19-25. Your There is no height or weight on file to calculate BMI. If this is out of the aformentioned range listed, please consider follow up with your Primary Care Provider.   ________________________________________________________  The [facility] providers would like to encourage you to use MYCHART to communicate with providers for non-urgent requests or questions.  Due to long hold times on the telephone, sending your provider a message by [facility] may be a faster and more efficient way to get a response.  Please allow 48 business hours for a response.  Please remember that this is for non-urgent requests.  _______________________________________________________

## 2024-03-25 ENCOUNTER — Encounter: Payer: Self-pay | Admitting: Internal Medicine

## 2024-03-25 MED ORDER — HYDROCODONE-ACETAMINOPHEN 10-325 MG PO TABS: 1.0000 | ORAL_TABLET | Freq: Four times a day (QID) | ORAL | 0 refills | Status: DC | PRN

## 2024-03-25 NOTE — Telephone Encounter (Signed)
 PDMP okay, Rx sent

## 2024-03-27 ENCOUNTER — Telehealth: Payer: Self-pay | Admitting: Gastroenterology

## 2024-03-27 DIAGNOSIS — I70245 Atherosclerosis of native arteries of left leg with ulceration of other part of foot: Secondary | ICD-10-CM | POA: Diagnosis not present

## 2024-03-27 DIAGNOSIS — S91102D Unspecified open wound of left great toe without damage to nail, subsequent encounter: Secondary | ICD-10-CM | POA: Diagnosis not present

## 2024-03-27 NOTE — Telephone Encounter (Signed)
 Patient son is calling requesting to speak to the nurse regarding his mother CT results. Patient son is requesting a call back. Please advise.

## 2024-03-27 NOTE — Telephone Encounter (Signed)
 Pt's son made aware of results & recommendations. Advised he call back with any further questions/concerns.

## 2024-03-27 NOTE — Telephone Encounter (Signed)
-----   Message from Devin Foerster May sent at 03/24/2024  1:57 PM EDT ----- Tyra Galley- please call the son and let him know that there is no evidence of a bowel obstruction. It shows gas and stool in the rectum. I would recommend he try the Linzess  every other day to see if she tolerates it better.  Deanna, NP

## 2024-03-28 ENCOUNTER — Other Ambulatory Visit: Payer: Self-pay | Admitting: Internal Medicine

## 2024-03-28 DIAGNOSIS — G43009 Migraine without aura, not intractable, without status migrainosus: Secondary | ICD-10-CM

## 2024-04-01 DIAGNOSIS — I70244 Atherosclerosis of native arteries of left leg with ulceration of heel and midfoot: Secondary | ICD-10-CM | POA: Diagnosis not present

## 2024-04-04 DIAGNOSIS — E785 Hyperlipidemia, unspecified: Secondary | ICD-10-CM | POA: Diagnosis not present

## 2024-04-04 DIAGNOSIS — E1152 Type 2 diabetes mellitus with diabetic peripheral angiopathy with gangrene: Secondary | ICD-10-CM | POA: Diagnosis not present

## 2024-04-04 DIAGNOSIS — I7025 Atherosclerosis of native arteries of other extremities with ulceration: Secondary | ICD-10-CM | POA: Diagnosis not present

## 2024-04-04 DIAGNOSIS — I70213 Atherosclerosis of native arteries of extremities with intermittent claudication, bilateral legs: Secondary | ICD-10-CM | POA: Diagnosis not present

## 2024-04-08 DIAGNOSIS — I70245 Atherosclerosis of native arteries of left leg with ulceration of other part of foot: Secondary | ICD-10-CM | POA: Diagnosis not present

## 2024-04-10 DIAGNOSIS — I70245 Atherosclerosis of native arteries of left leg with ulceration of other part of foot: Secondary | ICD-10-CM | POA: Diagnosis not present

## 2024-04-10 DIAGNOSIS — I70213 Atherosclerosis of native arteries of extremities with intermittent claudication, bilateral legs: Secondary | ICD-10-CM | POA: Diagnosis not present

## 2024-04-10 DIAGNOSIS — E785 Hyperlipidemia, unspecified: Secondary | ICD-10-CM | POA: Diagnosis not present

## 2024-04-10 DIAGNOSIS — E1152 Type 2 diabetes mellitus with diabetic peripheral angiopathy with gangrene: Secondary | ICD-10-CM | POA: Diagnosis not present

## 2024-04-17 DIAGNOSIS — I70244 Atherosclerosis of native arteries of left leg with ulceration of heel and midfoot: Secondary | ICD-10-CM | POA: Diagnosis not present

## 2024-04-17 DIAGNOSIS — M79605 Pain in left leg: Secondary | ICD-10-CM | POA: Diagnosis not present

## 2024-04-17 DIAGNOSIS — I890 Lymphedema, not elsewhere classified: Secondary | ICD-10-CM | POA: Diagnosis not present

## 2024-04-17 DIAGNOSIS — E1152 Type 2 diabetes mellitus with diabetic peripheral angiopathy with gangrene: Secondary | ICD-10-CM | POA: Diagnosis not present

## 2024-04-24 DIAGNOSIS — E785 Hyperlipidemia, unspecified: Secondary | ICD-10-CM | POA: Diagnosis not present

## 2024-04-24 DIAGNOSIS — I70244 Atherosclerosis of native arteries of left leg with ulceration of heel and midfoot: Secondary | ICD-10-CM | POA: Diagnosis not present

## 2024-04-24 DIAGNOSIS — I10 Essential (primary) hypertension: Secondary | ICD-10-CM | POA: Diagnosis not present

## 2024-04-24 DIAGNOSIS — I70213 Atherosclerosis of native arteries of extremities with intermittent claudication, bilateral legs: Secondary | ICD-10-CM | POA: Diagnosis not present

## 2024-05-01 DIAGNOSIS — E1152 Type 2 diabetes mellitus with diabetic peripheral angiopathy with gangrene: Secondary | ICD-10-CM | POA: Diagnosis not present

## 2024-05-01 DIAGNOSIS — E785 Hyperlipidemia, unspecified: Secondary | ICD-10-CM | POA: Diagnosis not present

## 2024-05-01 DIAGNOSIS — I10 Essential (primary) hypertension: Secondary | ICD-10-CM | POA: Diagnosis not present

## 2024-05-01 DIAGNOSIS — R600 Localized edema: Secondary | ICD-10-CM | POA: Diagnosis not present

## 2024-05-01 DIAGNOSIS — I70245 Atherosclerosis of native arteries of left leg with ulceration of other part of foot: Secondary | ICD-10-CM | POA: Diagnosis not present

## 2024-05-08 DIAGNOSIS — I10 Essential (primary) hypertension: Secondary | ICD-10-CM | POA: Diagnosis not present

## 2024-05-08 DIAGNOSIS — E1152 Type 2 diabetes mellitus with diabetic peripheral angiopathy with gangrene: Secondary | ICD-10-CM | POA: Diagnosis not present

## 2024-05-08 DIAGNOSIS — R600 Localized edema: Secondary | ICD-10-CM | POA: Diagnosis not present

## 2024-05-08 DIAGNOSIS — I70245 Atherosclerosis of native arteries of left leg with ulceration of other part of foot: Secondary | ICD-10-CM | POA: Diagnosis not present

## 2024-05-15 DIAGNOSIS — I10 Essential (primary) hypertension: Secondary | ICD-10-CM | POA: Diagnosis not present

## 2024-05-15 DIAGNOSIS — E1152 Type 2 diabetes mellitus with diabetic peripheral angiopathy with gangrene: Secondary | ICD-10-CM | POA: Diagnosis not present

## 2024-05-15 DIAGNOSIS — I70245 Atherosclerosis of native arteries of left leg with ulceration of other part of foot: Secondary | ICD-10-CM | POA: Diagnosis not present

## 2024-05-15 DIAGNOSIS — R600 Localized edema: Secondary | ICD-10-CM | POA: Diagnosis not present

## 2024-05-16 ENCOUNTER — Telehealth: Payer: Self-pay

## 2024-05-16 NOTE — Telephone Encounter (Signed)
 Pt ready for scheduling for PROLIA  on or after : 06/18/24  Option# 1: Buy/Bill (Office supplied medication)  Out-of-pocket cost due at time of clinic visit: $0  Number of injection/visits approved: ---  Primary: MEDICARE Prolia  co-insurance: 0% Admin fee co-insurance: 0%  Secondary: TRIC[employer] FOR LIFE Prolia  co-insurance:  Admin fee co-insurance:   Medical Benefit Details: Date Benefits were checked: 05/16/24 Deductible: $257 Met of $257 Required/ Coinsurance: 0%/ Admin Fee: 0%  Prior Auth: N/A PA# Expiration Date:   # of doses approved: ----------------------------------------------------------------------- Option# 2- Med Obtained from pharmacy:  Pharmacy benefit: Copay $--- (Paid to pharmacy) Admin Fee: --- (Pay at clinic)  Prior Auth: --- PA# Expiration Date:   # of doses approved:   If patient wants fill through the pharmacy benefit please send prescription to: ---, and include estimated need by date in rx notes. Pharmacy will ship medication directly to the office.  Patient NOT eligible for Prolia  Copay Card. Copay Card can make patient's cost as little as $25. Link to apply: [email or web address]://www.[email or web address]/copay  ** This summary of benefits is an estimation of the patient's out-of-pocket cost. Exact cost may very based on individual plan coverage.

## 2024-05-16 NOTE — Telephone Encounter (Signed)
 SABRA

## 2024-05-16 NOTE — Telephone Encounter (Signed)
 Prolia  VOB initiated via [email or web address]  Next Prolia  inj DUE: 06/18/24

## 2024-05-22 DIAGNOSIS — I7025 Atherosclerosis of native arteries of other extremities with ulceration: Secondary | ICD-10-CM | POA: Diagnosis not present

## 2024-05-22 DIAGNOSIS — L8990 Pressure ulcer of unspecified site, unspecified stage: Secondary | ICD-10-CM | POA: Diagnosis not present

## 2024-05-22 DIAGNOSIS — E785 Hyperlipidemia, unspecified: Secondary | ICD-10-CM | POA: Diagnosis not present

## 2024-05-22 DIAGNOSIS — E1152 Type 2 diabetes mellitus with diabetic peripheral angiopathy with gangrene: Secondary | ICD-10-CM | POA: Diagnosis not present

## 2024-05-22 DIAGNOSIS — I70213 Atherosclerosis of native arteries of extremities with intermittent claudication, bilateral legs: Secondary | ICD-10-CM | POA: Diagnosis not present

## 2024-05-22 DIAGNOSIS — I10 Essential (primary) hypertension: Secondary | ICD-10-CM | POA: Diagnosis not present

## 2024-05-27 ENCOUNTER — Ambulatory Visit: Admitting: Internal Medicine

## 2024-05-27 ENCOUNTER — Encounter: Payer: Self-pay | Admitting: Internal Medicine

## 2024-05-27 VITALS — BP 98/68 | HR 50 | Temp 98.0°F | Resp 16 | Ht 64.0 in

## 2024-05-27 DIAGNOSIS — R197 Diarrhea, unspecified: Secondary | ICD-10-CM

## 2024-05-27 DIAGNOSIS — E1142 Type 2 diabetes mellitus with diabetic polyneuropathy: Secondary | ICD-10-CM | POA: Diagnosis not present

## 2024-05-27 DIAGNOSIS — I10 Essential (primary) hypertension: Secondary | ICD-10-CM

## 2024-05-27 DIAGNOSIS — L89321 Pressure ulcer of left buttock, stage 1: Secondary | ICD-10-CM

## 2024-05-27 DIAGNOSIS — I2510 Atherosclerotic heart disease of native coronary artery without angina pectoris: Secondary | ICD-10-CM

## 2024-05-27 DIAGNOSIS — R630 Anorexia: Secondary | ICD-10-CM

## 2024-05-27 MED ORDER — HYDROCODONE-ACETAMINOPHEN 10-325 MG PO TABS: 1.0000 | ORAL_TABLET | Freq: Four times a day (QID) | ORAL | 0 refills | Status: DC | PRN

## 2024-05-27 NOTE — Progress Notes (Signed)
 Subjective:    Patient ID: [name], female    DOB: 01-Mar-1935, 89 y.o.   MRN: [id number]  DOS:  05/27/2024 Type of visit - description: Follow-up, here with her son.  Chronic med problems  discussed today.  The main issue her is that has been eating much less for the last several weeks. States that she simply has no appetite. Denies dysphagia, no postprandial abdominal pain, no major heartburn. Her stools are described as diarrhea but she could not tell me more details about how they look other than no blood in the stools or bloody diarrhea.  Her BP has been trending down, her son thinks that is related to poor p.o. intake.  has a left buttock bedsore for several months.  BP Readings from Last 3 Encounters:  05/27/24 98/68  02/18/24 128/78  02/18/24 133/62     Review of Systems See above   Past Medical History:  Diagnosis Date   Allergic rhinitis    Arthritis    back; ankles; hands; knees (10/31/2013)   Bilateral sensorineural hearing loss    CAD (coronary artery disease)    a. Nonobst in 2011. b. Abnormal nuc 09/2013 -> s/p cutting balloon to D2, mild LAD disease; c. 08/2015 MV: no ischemia, EF 71%.   Carcinoma in situ in a polyp 1994   a. 1994 - malignant polyp removed during colonoscopy.   Chronic diastolic CHF (congestive heart failure) (HCC)    a. 09/2013 Echo: EF 60-65%, mild LVH, Gr1 DD.   Dementia (HCC)    Diverticulosis    DJD (degenerative joint disease)    Fatty liver    GERD (gastroesophageal reflux disease)    a. Hx GERD/esophageal dysmotility followed by Dr. Obie.    Hemorrhoids    Hiatal hernia    a. s/p Nissen fundoplication 2011.   Hyperlipidemia    a. patient unwilling to use statins.   Hypertensive heart disease    IBS (irritable bowel syndrome)    Macular degeneration 03/2009   Dr. Nicholaus   Neuropathy    a. Hands, feet, legs.   Orthostatic hypotension    Osteomyelitis (HCC)    a. Adm 04/2013: Charcot collapse of the right foot with  osteomyelitis and ulceration, s/p excision; b. 01/2016 s/p RLE transtibial amputation 2/2 Charcot rocker-bottom deformity and insensate neuropathy ulceration.   Osteoporosis    PE (pulmonary embolism) 12/2007   a. PE/DVT after neck surgery 2009. b. coumadin  d/c 10-2008.   PONV (postoperative nausea and vomiting) 2009   neck surgery   PPD positive    Recurrent UTI    RSD (reflex sympathetic dystrophy)    a. Chronic pain.   Spinal stenosis    Type II diabetes mellitus (HCC)    no on medication   Venous insufficiency    a. Contributing to LEE.   Wound of left foot     Past Surgical History:  Procedure Laterality Date   AMPUTATION  03/06/2012   Procedure: AMPUTATION FOOT;  Surgeon: Jerona LULLA Sage, MD;  Location: [facility] OR;  Service: Orthopedics;  Laterality: Left;  FIFTH RAY AMPUTATION    AMPUTATION Right 02/18/2016   Procedure: AMPUTATION BELOW KNEE;  Surgeon: Jerona Sage LULLA, MD;  Location: [facility] OR;  Service: Orthopedics;  Laterality: Right;   AMPUTATION Left 11/22/2016   Procedure: Amputation 4th Toe Left Foot at Metatarsophalangeal Joint;  Surgeon: Jerona Sage LULLA, MD;  Location: [facility] OR;  Service: Orthopedics;  Laterality: Left;   ANKLE FUSION  09/27/2012  Procedure: ANKLE FUSION;  Surgeon: Jerona LULLA Sage, MD;  Location: [facility] OR;  Service: Orthopedics;  Laterality: Left;  Left Tibiocalcaneal Fusion   ANKLE FUSION Right 05/09/2013   Procedure: ANKLE FUSION;  Surgeon: Jerona LULLA Sage, MD;  Location: [facility] OR;  Service: Orthopedics;  Laterality: Right;  Excision Osteomyelitis Base 1st MT Right Foot, Fusion Medial Column   ANTERIOR CERVICAL DECOMP/DISCECTOMY FUSION  12/18/07   For OA,  Dr. Barbarann:  fu by a PE   ANTERIOR CERVICAL DECOMP/DISCECTOMY FUSION N/A 11/29/2020   Procedure: Anterior Cervical Decompression Fusion - Cervical Three-Cerivcal Four;  Surgeon: Louis Shove, MD;  Location: [facility] OR;  Service: Neurosurgery;  Laterality: N/A;  3C   CARDIAC CATHETERIZATION  05/2010    at [facility] TUNNEL RELEASE  Left 09/2011   CATARACT EXTRACTION W/ INTRAOCULAR LENS  IMPLANT, BILATERAL  2004   feb 2004 left, aug 2004 right   CHOLECYSTECTOMY  03/2002   CORONARY ANGIOPLASTY  10/31/2013   CORONARY ARTERY BYPASS GRAFT  09/27/2016   CYST REMOVAL HAND  06/2003   FOOT BONE EXCISION Right 06/2009   LAPAROSCOPIC RIGHT HEMI COLECTOMY N/A 01/08/2015   Procedure: LAPAROSCOPIC ASSISTED RIGHT HEMI COLECTOMY;  Surgeon: Donnice Lunger, MD;  Location: [facility] ORS;  Service: General;  Laterality: N/A;   LEFT HEART CATHETERIZATION WITH CORONARY ANGIOGRAM N/A 10/31/2013   Procedure: LEFT HEART CATHETERIZATION WITH CORONARY ANGIOGRAM;  Surgeon: Candyce GORMAN Reek, MD;  Location: [facility] CATH LAB;  Service: Cardiovascular;  Laterality: N/A;   NASAL SEPTUM SURGERY  09/1963   NISSEN FUNDOPLICATION  01/25/2010   PERCUTANEOUS CORONARY INTERVENTION-BALLOON ONLY  10/31/2013   Procedure: PERCUTANEOUS CORONARY INTERVENTION-BALLOON ONLY;  Surgeon: Candyce GORMAN Reek, MD;  Location: [facility] CATH LAB;  Service: Cardiovascular;;   ROTATOR CUFF REPAIR Right 07/2007   Dr. Beverley FRIED CUFF REPAIR Left 11/2004   stent in left leg, behind knee  06/27/2017   x2, done at [facility] cardiology in [facility]   TOE AMPUTATION Right 08/2008   3rd toe, Dr. Sage due to osteomyelitis   VAGINAL HYSTERECTOMY  03/1975   VEIN LIGATION Bilateral 03/1966   VENA CAVA FILTER PLACEMENT  01/2010   green filter; due to blood clots   WISDOM TOOTH EXTRACTION  06/2007    Current Outpatient Medications  Medication Instructions   Alpha-Lipoic Acid 1,200 mg, Daily   amitriptyline  (ELAVIL ) 25 mg, Oral, Daily at bedtime   atorvastatin  (LIPITOR) 40 mg, Daily   carvedilol  (COREG ) 12.5 mg, 2 times daily with meals   CINNAMON PO 2,000 mg, Daily   clopidogrel  (PLAVIX ) 75 mg, Oral, Daily at bedtime, Restart on 12/04/2020   Cranberry 200 MG CAPS    diclofenac  Sodium (VOLTAREN ) 4 g, Topical, 4 times daily   dicyclomine  (BENTYL ) 10 MG capsule TAKE 1 CAPSULE BY MOUTH THREE TIMES  DAILY BEFORE MEALS   donepezil  (ARICEPT ) 10 mg, Oral, Daily at bedtime   famotidine  (PEPCID ) 20 mg, Oral, 2 times daily   furosemide  (LASIX ) 20 mg, Daily   HYDROcodone -acetaminophen  (NORCO) 10-325 MG tablet 1 tablet, Oral, Every 6 hours PRN   hydrocortisone  (ANUSOL -HC) 25 mg, Rectal, 2 times daily, As needed   hydrocortisone  2.5 % cream APPLY TOPICALLY TO THE AFFECTED AREA TWICE DAILY   hydrocortisone -pramoxine (ANALPRAM-HC) 2.5-1 % rectal cream APPLY RECTALLY TO THE AFFECTED AREA IN THE MORNING AND AT BEDTIME AS NEEDED   ketoconazole  (NIZORAL ) 2 % cream 1 Application, Topical, 2 times daily   Lancets (FREESTYLE) lancets CHECK BLOOD SUGAR TWICE DAILY  loperamide  (IMODIUM  A-D) 2 mg, As needed   MegaRed Omega-3 Krill Oil 500 MG CAPS Restart on 12/04/2020   methenamine  (HIPREX ) 1 g tablet 0.5 tablets, Daily   Multiple Vitamins-Minerals (PRESERVISION AREDS 2) CAPS 1 capsule, 2 times daily   naloxone  (NARCAN ) nasal spray 4 mg/0.1 mL 1 spray, Nasal, Once PRN   nitroGLYCERIN  (NITROSTAT ) 0.4 mg, Sublingual, Every 5 min x3 PRN   omeprazole  (PRILOSEC) 20 mg, Oral, Daily, As needed   ondansetron  (ZOFRAN ) 4 mg, Oral, Every 8 hours PRN   OVER THE COUNTER MEDICATION 1 tablet, 3 times daily   Polyvinyl Alcohol -Povidone (REFRESH OP) 1 drop, 3 times daily PRN   pramipexole  (MIRAPEX ) 0.125-0.25 mg, Oral, At bedtime PRN   Prenatal Vit-Fe Fumarate-FA (PRENATAL MULTIVITAMIN) TABS tablet 1 tablet, Daily   Probiotic Product (PROBIOTIC PEARLS PO) 1 tablet, Daily   rivaroxaban  (XARELTO ) 2.5 mg, Oral, 2 times daily, Restart on 12/04/2020   silver  sulfADIAZINE  (SILVADENE ) 1 % cream 1 application , Topical, Daily   Vitamin B-12 5,000 mcg, Every other day   vitamin C with rose hips 500 mg, 2 times daily   Vitamin D3 5,000 Units, Every evening   vitamin E  400 Units, Daily   Zinc Sulfate 66 MG TABS        Objective:   Physical Exam Skin:       BP 98/68   Pulse (!) 50   Temp 98 F (36.7 C) (Oral)   Resp 16    Ht 5' 4 (1.626 m)   SpO2 95%   BMI 29.55 kg/m  General:   Well developed, NAD, BMI noted. HEENT:  Normocephalic . Face symmetric, atraumatic Lungs:  CTA B Normal respiratory effort, no intercostal retractions, no accessory muscle use. Heart: RRR,  no murmur. Abdomen: Soft, nontender, nondistended. Neurologic:  alert & oriented X3.  Speech normal, gait not tested Psych--  Cognition and judgment appear intact.  Cooperative with normal attention span and concentration.  Behavior appropriate. No anxious or depressed appearing.      Assessment      ASSESSMENT DM, Neuropathy, amputations due to Charcot feet. - metformin  intolerant  (lethargic) HTN  Hyperlipidemia CV: ---CAD cardiologist in [facility] --NSTEMI 09-2016. Outside hospital: Cath 2 vessel disease, CABG 2, LIMA to LAD and Diag   ---Chronic diastolic CHF ---PVD: Multiple procedures per Dr Dedra, ([place] ) ---May 2023: Cardiology rec continue Plavix  and Xarelto , stop aspirin . ---  IVC filter seen on CXR  09/23/2020 GI: GERD, IBS, colon polyps, PUD, abdominal pain post polypectomy syndrome naueas meds prn (antivert -zofran , gets nausea when car traveling) Osteoporosis  MSK,pain mngmt : --Reflex sympathetic dystrophy --Neuropathy --DJD --Spinal stenosis  --H/o Osteomyelitis  Foot --s/p amputation:  4th 5th L toes , R BKA  (01-2016, dx osteomyelitis) w/ phantom pain  -- sees Dr Harden prn She took oxycodone  after surgery and that did NOT seem to help better than hydrocodone . Intolerant to Cymbalta, Neurontin ; Lyrica helped but caused edema. Recurrent UTIs: Dr Nieves  Venous insufficiency Dementia: MMSE 23 (January 2023) H/o  + PPD H/o hypothyroidism: TSHs stable H/o dyspnea  LUNG MASS -incidental per CT 2009,  PET scan 01-2008: likely  benign, CT 08-2009 no change, no further CTs per Dr Corrie - Had a CT in [place]  09-2016 spiculated mass, saw Dr Shelah,  CT  09/2017 stable   PLAN:  DM: Last  A1c 5.3, on no meds since.  On no meds.   Poor appetite: The patient reports no appetite, ROS is negative, no  dysphagia or postprandial abdominal pain.  No blood in the stools. Has a history of IBS, at some point in her life she was constipated but now she has more diarrhea.  Not on Linzess  or Imodium . She is able to tolerate shakes.  Recommend supplements diet w/ Glucerna. Has taken antibiotics on and off, will get C. difficile in the stools.  Container provided but she is leaving to [place]  soon. HTN: Current medications carvedilol , Lasix .  BPs were normal up to a month ago when appetite decreased.  BPs checked at least weekly at the wound care center and ranges from 116/70 and 95/60. Plan: Push fluids, hold Lasix  if p.o. low.  Checking labs Increase creatinine: Last creatinine slightly up, probably from pulm p.o. intake.  Rechecking today. CAD: To see cardiology in November. High cholesterol: Last LDL 29 Pressure ulcer: See physical exam, already using sheepskin, recommend good mobilization.  Red flag symptoms discussed. Pain management.  PDMP okay, hydrocodone  refilled.  They are trying to minimize use of hydrocodone . Neck pain: See LOV, cervical x-ray April 2025 no acute. Osteoporosis  Last Prolia  11/2023 Skin breakdown: At the right BKA area, was seen by a prosthesis provider and area looks better per son. RTC 3 months

## 2024-05-27 NOTE — Assessment & Plan Note (Signed)
 DM: Last A1c 5.3, on no meds since.  On no meds.   Poor appetite: The patient reports no appetite, ROS is negative, no dysphagia or postprandial abdominal pain.  No blood in the stools. Has a history of IBS, at some point in her life she was constipated but now she has more diarrhea.  Not on Linzess  or Imodium . She is able to tolerate shakes.  Recommend supplements diet w/ Glucerna. Has taken antibiotics on and off, will get C. difficile in the stools.  Container provided but she is leaving to [place]  soon. HTN: Current medications carvedilol , Lasix .  BPs were normal up to a month ago when appetite decreased.  BPs checked at least weekly at the wound care center and ranges from 116/70 and 95/60. Plan: Push fluids, hold Lasix  if p.o. low.  Checking labs Increase creatinine: Last creatinine slightly up, probably from pulm p.o. intake.  Rechecking today. CAD: To see cardiology in November. High cholesterol: Last LDL 29 Pressure ulcer: See physical exam, already using sheepskin, recommend good mobilization.  Red flag symptoms discussed. Pain management.  PDMP okay, hydrocodone  refilled.  They are trying to minimize use of hydrocodone . Neck pain: See LOV, cervical x-ray April 2025 no acute. Osteoporosis  Last Prolia  11/2023 Skin breakdown: At the right BKA area, was seen by a prosthesis provider and area looks better per son. RTC 3 months

## 2024-05-27 NOTE — Patient Instructions (Addendum)
 For poor appetite: Push fluids You can supplement your diet with Glucerna The days that you do not eat, do not take Lasix  If you are not improving, let us  know, we can send you to the gastroenterology service.  Pressure ulcer: Continue using sheepskin Try to change positions frequently as possible.  Blood work today  Bring stool sample  Next visit in 3 months, please make an appointment

## 2024-05-28 ENCOUNTER — Ambulatory Visit: Payer: Self-pay | Admitting: Internal Medicine

## 2024-05-28 ENCOUNTER — Encounter: Payer: Self-pay | Admitting: Internal Medicine

## 2024-05-28 LAB — CBC WITH DIFFERENTIAL/PLATELET
Basophils Absolute: 0 K/uL (ref 0.0–0.1)
Basophils Relative: 0.7 % (ref 0.0–3.0)
Eosinophils Absolute: 0.1 K/uL (ref 0.0–0.7)
Eosinophils Relative: 2.3 % (ref 0.0–5.0)
HCT: 33.4 % — ABNORMAL LOW (ref 36.0–46.0)
Hemoglobin: 10.9 g/dL — ABNORMAL LOW (ref 12.0–15.0)
Lymphocytes Relative: 19.6 % (ref 12.0–46.0)
Lymphs Abs: 0.9 K/uL (ref 0.7–4.0)
MCHC: 32.7 g/dL (ref 30.0–36.0)
MCV: 97.7 fl (ref 78.0–100.0)
Monocytes Absolute: 0.3 K/uL (ref 0.1–1.0)
Monocytes Relative: 5.6 % (ref 3.0–12.0)
Neutro Abs: 3.4 K/uL (ref 1.4–7.7)
Neutrophils Relative %: 71.8 % (ref 43.0–77.0)
Platelets: 140 K/uL — ABNORMAL LOW (ref 150.0–400.0)
RBC: 3.42 Mil/uL — ABNORMAL LOW (ref 3.87–5.11)
RDW: 15.4 % (ref 11.5–15.5)
WBC: 4.8 K/uL (ref 4.0–10.5)

## 2024-05-28 LAB — BASIC METABOLIC PANEL WITH GFR
BUN: 16 mg/dL (ref 6–23)
CO2: 23 meq/L (ref 19–32)
Calcium: 8.8 mg/dL (ref 8.4–10.5)
Chloride: 109 meq/L (ref 96–112)
Creatinine, Ser: 1.38 mg/dL — ABNORMAL HIGH (ref 0.40–1.20)
GFR: 34.55 mL/min — ABNORMAL LOW (ref >60.00)
Glucose, Bld: 91 mg/dL (ref 70–99)
Potassium: 4.5 meq/L (ref 3.5–5.1)
Sodium: 140 meq/L (ref 135–145)

## 2024-05-29 DIAGNOSIS — I70244 Atherosclerosis of native arteries of left leg with ulceration of heel and midfoot: Secondary | ICD-10-CM | POA: Diagnosis not present

## 2024-05-29 DIAGNOSIS — I10 Essential (primary) hypertension: Secondary | ICD-10-CM | POA: Diagnosis not present

## 2024-05-29 DIAGNOSIS — E785 Hyperlipidemia, unspecified: Secondary | ICD-10-CM | POA: Diagnosis not present

## 2024-05-29 DIAGNOSIS — I7025 Atherosclerosis of native arteries of other extremities with ulceration: Secondary | ICD-10-CM | POA: Diagnosis not present

## 2024-05-29 DIAGNOSIS — E1152 Type 2 diabetes mellitus with diabetic peripheral angiopathy with gangrene: Secondary | ICD-10-CM | POA: Diagnosis not present

## 2024-06-02 DIAGNOSIS — E1152 Type 2 diabetes mellitus with diabetic peripheral angiopathy with gangrene: Secondary | ICD-10-CM | POA: Diagnosis not present

## 2024-06-02 DIAGNOSIS — E785 Hyperlipidemia, unspecified: Secondary | ICD-10-CM | POA: Diagnosis not present

## 2024-06-02 DIAGNOSIS — I7025 Atherosclerosis of native arteries of other extremities with ulceration: Secondary | ICD-10-CM | POA: Diagnosis not present

## 2024-06-02 DIAGNOSIS — I10 Essential (primary) hypertension: Secondary | ICD-10-CM | POA: Diagnosis not present

## 2024-06-05 DIAGNOSIS — E785 Hyperlipidemia, unspecified: Secondary | ICD-10-CM | POA: Diagnosis not present

## 2024-06-05 DIAGNOSIS — I7025 Atherosclerosis of native arteries of other extremities with ulceration: Secondary | ICD-10-CM | POA: Diagnosis not present

## 2024-06-05 DIAGNOSIS — I10 Essential (primary) hypertension: Secondary | ICD-10-CM | POA: Diagnosis not present

## 2024-06-05 DIAGNOSIS — E1152 Type 2 diabetes mellitus with diabetic peripheral angiopathy with gangrene: Secondary | ICD-10-CM | POA: Diagnosis not present

## 2024-06-05 DIAGNOSIS — I70245 Atherosclerosis of native arteries of left leg with ulceration of other part of foot: Secondary | ICD-10-CM | POA: Diagnosis not present

## 2024-06-09 DIAGNOSIS — I7025 Atherosclerosis of native arteries of other extremities with ulceration: Secondary | ICD-10-CM | POA: Diagnosis not present

## 2024-06-09 DIAGNOSIS — I70213 Atherosclerosis of native arteries of extremities with intermittent claudication, bilateral legs: Secondary | ICD-10-CM | POA: Diagnosis not present

## 2024-06-09 DIAGNOSIS — E1152 Type 2 diabetes mellitus with diabetic peripheral angiopathy with gangrene: Secondary | ICD-10-CM | POA: Diagnosis not present

## 2024-06-09 DIAGNOSIS — E785 Hyperlipidemia, unspecified: Secondary | ICD-10-CM | POA: Diagnosis not present

## 2024-06-12 DIAGNOSIS — I10 Essential (primary) hypertension: Secondary | ICD-10-CM | POA: Diagnosis not present

## 2024-06-12 DIAGNOSIS — I70213 Atherosclerosis of native arteries of extremities with intermittent claudication, bilateral legs: Secondary | ICD-10-CM | POA: Diagnosis not present

## 2024-06-12 DIAGNOSIS — I70245 Atherosclerosis of native arteries of left leg with ulceration of other part of foot: Secondary | ICD-10-CM | POA: Diagnosis not present

## 2024-06-12 DIAGNOSIS — I70244 Atherosclerosis of native arteries of left leg with ulceration of heel and midfoot: Secondary | ICD-10-CM | POA: Diagnosis not present

## 2024-06-12 DIAGNOSIS — R079 Chest pain, unspecified: Secondary | ICD-10-CM | POA: Diagnosis not present

## 2024-06-12 DIAGNOSIS — L8990 Pressure ulcer of unspecified site, unspecified stage: Secondary | ICD-10-CM | POA: Diagnosis not present

## 2024-06-12 DIAGNOSIS — I890 Lymphedema, not elsewhere classified: Secondary | ICD-10-CM | POA: Diagnosis not present

## 2024-06-12 DIAGNOSIS — E1152 Type 2 diabetes mellitus with diabetic peripheral angiopathy with gangrene: Secondary | ICD-10-CM | POA: Diagnosis not present

## 2024-06-12 DIAGNOSIS — Z743 Need for continuous supervision: Secondary | ICD-10-CM | POA: Diagnosis not present

## 2024-06-12 DIAGNOSIS — E785 Hyperlipidemia, unspecified: Secondary | ICD-10-CM | POA: Diagnosis not present

## 2024-06-12 DIAGNOSIS — I7025 Atherosclerosis of native arteries of other extremities with ulceration: Secondary | ICD-10-CM | POA: Diagnosis not present

## 2024-06-13 DIAGNOSIS — R197 Diarrhea, unspecified: Secondary | ICD-10-CM | POA: Diagnosis not present

## 2024-06-13 DIAGNOSIS — Z79899 Other long term (current) drug therapy: Secondary | ICD-10-CM | POA: Diagnosis not present

## 2024-06-13 DIAGNOSIS — R519 Headache, unspecified: Secondary | ICD-10-CM | POA: Diagnosis not present

## 2024-06-13 DIAGNOSIS — N390 Urinary tract infection, site not specified: Secondary | ICD-10-CM | POA: Diagnosis not present

## 2024-06-13 DIAGNOSIS — Z951 Presence of aortocoronary bypass graft: Secondary | ICD-10-CM | POA: Diagnosis not present

## 2024-06-13 DIAGNOSIS — R079 Chest pain, unspecified: Secondary | ICD-10-CM | POA: Diagnosis not present

## 2024-06-13 DIAGNOSIS — F0390 Unspecified dementia without behavioral disturbance: Secondary | ICD-10-CM | POA: Diagnosis not present

## 2024-06-13 DIAGNOSIS — Z7982 Long term (current) use of aspirin: Secondary | ICD-10-CM | POA: Diagnosis not present

## 2024-06-13 DIAGNOSIS — E119 Type 2 diabetes mellitus without complications: Secondary | ICD-10-CM | POA: Diagnosis not present

## 2024-06-13 DIAGNOSIS — R0789 Other chest pain: Secondary | ICD-10-CM | POA: Diagnosis not present

## 2024-06-16 ENCOUNTER — Inpatient Hospital Stay (HOSPITAL_BASED_OUTPATIENT_CLINIC_OR_DEPARTMENT_OTHER): Admitting: Family

## 2024-06-16 ENCOUNTER — Inpatient Hospital Stay: Attending: Family

## 2024-06-16 VITALS — BP 108/55 | HR 74 | Temp 98.4°F | Resp 19 | Ht 64.0 in

## 2024-06-16 DIAGNOSIS — D709 Neutropenia, unspecified: Secondary | ICD-10-CM | POA: Insufficient documentation

## 2024-06-16 DIAGNOSIS — D500 Iron deficiency anemia secondary to blood loss (chronic): Secondary | ICD-10-CM | POA: Diagnosis not present

## 2024-06-16 DIAGNOSIS — D649 Anemia, unspecified: Secondary | ICD-10-CM

## 2024-06-16 DIAGNOSIS — E538 Deficiency of other specified B group vitamins: Secondary | ICD-10-CM

## 2024-06-16 DIAGNOSIS — K922 Gastrointestinal hemorrhage, unspecified: Secondary | ICD-10-CM | POA: Insufficient documentation

## 2024-06-16 DIAGNOSIS — D696 Thrombocytopenia, unspecified: Secondary | ICD-10-CM | POA: Diagnosis not present

## 2024-06-16 DIAGNOSIS — D509 Iron deficiency anemia, unspecified: Secondary | ICD-10-CM | POA: Diagnosis not present

## 2024-06-16 LAB — CMP (CANCER CENTER ONLY)
ALT: 11 U/L (ref 0–44)
AST: 19 U/L (ref 15–41)
Albumin: 3.9 g/dL (ref 3.5–5.0)
Alkaline Phosphatase: 39 U/L (ref 38–126)
Anion gap: 12 (ref 5–15)
BUN: 20 mg/dL (ref 8–23)
CO2: 21 mmol/L — ABNORMAL LOW (ref 22–32)
Calcium: 9.3 mg/dL (ref 8.9–10.3)
Chloride: 107 mmol/L (ref 98–111)
Creatinine: 1.31 mg/dL — ABNORMAL HIGH (ref 0.44–1.00)
GFR, Estimated: 39 mL/min — ABNORMAL LOW (ref >60)
Glucose, Bld: 141 mg/dL — ABNORMAL HIGH (ref 70–99)
Potassium: 4.6 mmol/L (ref 3.5–5.1)
Sodium: 140 mmol/L (ref 135–145)
Total Bilirubin: 0.3 mg/dL (ref 0.0–1.2)
Total Protein: 5.9 g/dL — ABNORMAL LOW (ref 6.5–8.1)

## 2024-06-16 LAB — CBC WITH DIFFERENTIAL (CANCER CENTER ONLY)
Abs Immature Granulocytes: 0.02 K/uL (ref 0.00–0.07)
Basophils Absolute: 0 K/uL (ref 0.0–0.1)
Basophils Relative: 0 %
Eosinophils Absolute: 0.1 K/uL (ref 0.0–0.5)
Eosinophils Relative: 2 %
HCT: 31.8 % — ABNORMAL LOW (ref 36.0–46.0)
Hemoglobin: 9.9 g/dL — ABNORMAL LOW (ref 12.0–15.0)
Immature Granulocytes: 1 %
Lymphocytes Relative: 27 %
Lymphs Abs: 1.1 K/uL (ref 0.7–4.0)
MCH: 31.4 pg (ref 26.0–34.0)
MCHC: 31.1 g/dL (ref 30.0–36.0)
MCV: 101 fL — ABNORMAL HIGH (ref 80.0–100.0)
Monocytes Absolute: 0.2 K/uL (ref 0.1–1.0)
Monocytes Relative: 5 %
Neutro Abs: 2.7 K/uL (ref 1.7–7.7)
Neutrophils Relative %: 65 %
Platelet Count: 117 K/uL — ABNORMAL LOW (ref 150–400)
RBC: 3.15 MIL/uL — ABNORMAL LOW (ref 3.87–5.11)
RDW: 13.3 % (ref 11.5–15.5)
WBC Count: 4.2 K/uL (ref 4.0–10.5)
nRBC: 0 % (ref 0.0–0.2)

## 2024-06-16 LAB — FERRITIN: Ferritin: 20 ng/mL (ref 11–307)

## 2024-06-16 LAB — RETICULOCYTES
Immature Retic Fract: 12 % (ref 2.3–15.9)
RBC.: 3.13 MIL/uL — ABNORMAL LOW (ref 3.87–5.11)
Retic Count, Absolute: 44.1 K/uL (ref 19.0–186.0)
Retic Ct Pct: 1.4 % (ref 0.4–3.1)

## 2024-06-16 LAB — IRON AND IRON BINDING CAPACITY (CC-WL,HP ONLY)
Iron: 57 ug/dL (ref 28–170)
Saturation Ratios: 22 % (ref 10.4–31.8)
TIBC: 263 ug/dL (ref 250–450)
UIBC: 206 ug/dL

## 2024-06-16 LAB — VITAMIN B12: Vitamin B-12: 595 pg/mL (ref 180–914)

## 2024-06-16 NOTE — Progress Notes (Signed)
 Hematology and Oncology Follow Up Visit  [name] [id number] 12/20/1934 89 y.o. 06/16/2024   Principle Diagnosis:  Iron deficiency anemia  Mild thrombocytopenia  Mild Neutropenia   Current Therapy:        IV iron as indicated    Interim History:  [name] is here today with her son for follow-up. She is doing well but notes that her short term memory is getting a little worse.  She lives with her son who is her pain caregiver and also has a daughter and grandson that help with her care as well.  No issue with blood loss. No bruising or petechiae.  No fever, chills, n/v, cough, rash, dizziness, SOB, chest pain, palpitations, abdominal pain or changes in bowel or bladder habits.  Swelling in the left leg with healing wound in a support boot.  She also has a new wound on the right buttock that her son is keeping clean and dry as it heals.  No falls or syncope reported.  Appetite and hydration are good.   ECOG Performance Status: 1 - Symptomatic but completely ambulatory  Medications:  Allergies as of 06/16/2024       Reactions   Metformin  And Related Diarrhea, Nausea Only, Other (See Comments)   dizzy, tired, chills   Cymbalta [duloxetine Hcl] Swelling   Swelling in legs   Gabapentin Swelling   Swelling in legs   Silicone Hives, Itching, Dermatitis, Rash   Tramadol Swelling   Adhesive [tape] Rash   Cefazolin Hives      Ciprofloxacin  Other (See Comments)   body aches   Clorazepate Dipotassium Other (See Comments)   Unknown reaction   Dilaudid  [hydromorphone  Hcl] Itching    confused,   Doxycycline Rash   Enablex [darifenacin Hydrobromide Er] Other (See Comments)   Hypotension, near syncope   Levofloxacin Other (See Comments)   Causes wrist pain   Lyrica [pregabalin] Swelling   Swelling in legs   Methadone Hcl Other (See Comments)   Reaction unknown   Morphine  And Codeine Other (See Comments)   Confusion, constipation.   Talwin [pentazocine] Other (See  Comments)   climbing walls anxiety        Medication List        Accurate as of June 16, 2024  1:46 PM. If you have any questions, ask your nurse or doctor.          Alpha-Lipoic Acid 600 MG Caps Take 1,200 mg by mouth daily.   amitriptyline  25 MG tablet Commonly known as: ELAVIL  Take 1 tablet (25 mg total) by mouth at bedtime.   atorvastatin  40 MG tablet Commonly known as: LIPITOR Take 40 mg by mouth daily.   carvedilol  12.5 MG tablet Commonly known as: COREG  Take 12.5 mg by mouth 2 (two) times daily with a meal.   CINNAMON PO Take 2,000 mg by mouth daily.   clopidogrel  75 MG tablet Commonly known as: PLAVIX  Take 1 tablet (75 mg total) by mouth at bedtime. Restart on 12/04/2020   Cranberry 200 MG Caps   diclofenac  Sodium 1 % Gel Commonly known as: VOLTAREN  Apply 4 g topically 4 (four) times daily. What changed: additional instructions   dicyclomine  10 MG capsule Commonly known as: BENTYL  TAKE 1 CAPSULE BY MOUTH THREE TIMES DAILY BEFORE MEALS   donepezil  10 MG tablet Commonly known as: ARICEPT  Take 1 tablet (10 mg total) by mouth at bedtime.   famotidine  20 MG tablet Commonly known as: PEPCID  Take 1 tablet (20 mg total)  by mouth 2 (two) times daily.   freestyle lancets CHECK BLOOD SUGAR TWICE DAILY   furosemide  20 MG tablet Commonly known as: LASIX  Take 20 mg by mouth daily.   HYDROcodone -acetaminophen  10-325 MG tablet Commonly known as: NORCO Take 1 tablet by mouth every 6 (six) hours as needed.   hydrocortisone  2.5 % cream APPLY TOPICALLY TO THE AFFECTED AREA TWICE DAILY   hydrocortisone  25 MG suppository Commonly known as: ANUSOL -HC Place 1 suppository (25 mg total) rectally 2 (two) times daily. As needed   hydrocortisone -pramoxine 2.5-1 % rectal cream Commonly known as: ANALPRAM-HC APPLY RECTALLY TO THE AFFECTED AREA IN THE MORNING AND AT BEDTIME AS NEEDED   ketoconazole  2 % cream Commonly known as: NIZORAL  Apply 1 Application  topically 2 (two) times daily.   loperamide  2 MG tablet Commonly known as: IMODIUM  A-D Take 2 mg by mouth as needed for diarrhea or loose stools.   MegaRed Omega-3 Krill Oil 500 MG Caps Restart on 12/04/2020   methenamine  1 g tablet Commonly known as: HIPREX  Take 0.5 tablets by mouth daily.   naloxone  4 MG/0.1ML Liqd nasal spray kit Commonly known as: NARCAN  Place 1 spray into the nose once as needed for up to 1 dose.   nitroGLYCERIN  0.4 MG SL tablet Commonly known as: NITROSTAT  Place 1 tablet (0.4 mg total) under the tongue every 5 (five) minutes x 3 doses as needed for chest pain.   omeprazole  20 MG capsule Commonly known as: PRILOSEC Take 1 capsule (20 mg total) by mouth daily. As needed   ondansetron  4 MG tablet Commonly known as: ZOFRAN  Take 1 tablet (4 mg total) by mouth every 8 (eight) hours as needed for nausea or vomiting.   OVER THE COUNTER MEDICATION 1 tablet 3 (three) times daily. Berberorine Gluco Defense   pramipexole  0.125 MG tablet Commonly known as: MIRAPEX  Take 1-2 tablets (0.125-0.25 mg total) by mouth at bedtime as needed.   prenatal multivitamin Tabs tablet Take 1 tablet by mouth daily at 12 noon.   PreserVision AREDS 2 Caps Take 1 capsule by mouth 2 (two) times daily.   PROBIOTIC PEARLS PO Take 1 tablet by mouth daily.   REFRESH OP Place 1 drop into both eyes 3 (three) times daily as needed (dry eyes).   rivaroxaban  2.5 MG Tabs tablet Commonly known as: XARELTO  Take 1 tablet (2.5 mg total) by mouth 2 (two) times daily. Restart on 12/04/2020   silver  sulfADIAZINE  1 % cream Commonly known as: Silvadene  Apply 1 application topically daily.   Vitamin B-12 5000 MCG Subl Place 5,000 mcg under the tongue every other day.   vitamin C with rose hips 500 MG tablet Take 500 mg by mouth in the morning and at bedtime.   Vitamin D3 125 MCG (5000 UT) Caps Take 5,000 Units by mouth every evening. Reported on 11/24/2015   vitamin E  180 MG (400 UNITS)  capsule Take 400 Units by mouth daily.   Zinc Sulfate 66 MG Tabs        Allergies:  Allergies  Allergen Reactions   Metformin  And Related Diarrhea, Nausea Only and Other (See Comments)    dizzy, tired, chills   Cymbalta [Duloxetine Hcl] Swelling    Swelling in legs   Gabapentin Swelling    Swelling in legs   Silicone Hives, Itching, Dermatitis and Rash   Tramadol Swelling   Adhesive [Tape] Rash   Cefazolin Hives        Ciprofloxacin  Other (See Comments)    body aches  Clorazepate Dipotassium Other (See Comments)    Unknown reaction   Dilaudid  [Hydromorphone  Hcl] Itching     confused,   Doxycycline Rash   Enablex [Darifenacin Hydrobromide Er] Other (See Comments)    Hypotension, near syncope   Levofloxacin Other (See Comments)    Causes wrist pain   Lyrica [Pregabalin] Swelling    Swelling in legs   Methadone Hcl Other (See Comments)    Reaction unknown   Morphine  And Codeine Other (See Comments)    Confusion, constipation.    Talwin [Pentazocine] Other (See Comments)    climbing walls anxiety    Past Medical History, Surgical history, Social history, and Family History were reviewed and updated.  Review of Systems: All other 10 point review of systems is negative.   Physical Exam:  vitals were not taken for this visit.   Wt Readings from Last 3 Encounters:  02/18/24 172 lb 2 oz (78.1 kg)  10/29/23 156 lb 1.4 oz (70.8 kg)  10/19/23 156 lb (70.8 kg)    Ocular: Sclerae unicteric, pupils equal, round and reactive to light Ear-nose-throat: Oropharynx clear, dentition fair Lymphatic: No cervical or supraclavicular adenopathy Lungs no rales or rhonchi, good excursion bilaterally Heart regular rate and rhythm, no murmur appreciated Abd soft, nontender, positive bowel sounds MSK no focal spinal tenderness, no joint edema Neuro: non-focal, well-oriented, appropriate affect Breasts: Deferred   Lab Results  Component Value Date   WBC 4.2 06/16/2024    HGB 9.9 (L) 06/16/2024   HCT 31.8 (L) 06/16/2024   MCV 101.0 (H) 06/16/2024   PLT PENDING 06/16/2024   Lab Results  Component Value Date   FERRITIN 30 02/18/2024   IRON 87 02/18/2024   TIBC 318 02/18/2024   UIBC 231 02/18/2024   IRONPCTSAT 27 02/18/2024   Lab Results  Component Value Date   RETICCTPCT 1.0 06/18/2023   RBC 3.15 (L) 06/16/2024   No results found for: KPAFRELGTCHN, LAMBDASER, KAPLAMBRATIO No results found for: IGGSERUM, IGA, IGMSERUM No results found for: STEPHANY CARLOTA BENSON MARKEL EARLA JOANNIE DOC VICK, SPEI   Chemistry      Component Value Date/Time   NA 140 05/27/2024 1524   NA 140 09/03/2020 0000   K 4.5 05/27/2024 1524   CL 109 05/27/2024 1524   CO2 23 05/27/2024 1524   BUN 16 05/27/2024 1524   BUN 17 09/03/2020 0000   CREATININE 1.38 (H) 05/27/2024 1524   CREATININE 1.41 (H) 02/18/2024 1337   CREATININE 1.12 (H) 06/22/2023 1515   GLU 146 09/03/2020 0000      Component Value Date/Time   CALCIUM  8.8 05/27/2024 1524   ALKPHOS 36 (L) 02/18/2024 1337   AST 22 02/18/2024 1337   ALT 11 02/18/2024 1337   BILITOT 0.5 02/18/2024 1337       Impression and Plan:  Ms. Younis is a very pleasant 89 yo caucasian female with iron deficiency anemia secondary to GI blood loss and mild thrombocytopenia.   CBC counts remain stable at this time.  Iron studies are pending. We will replace if needed.  Follow-up in another 2 months.   Lauraine Pepper, NP 8/25/20251:46 PM

## 2024-06-17 ENCOUNTER — Ambulatory Visit: Payer: Self-pay

## 2024-07-01 ENCOUNTER — Ambulatory Visit (INDEPENDENT_AMBULATORY_CARE_PROVIDER_SITE_OTHER): Admitting: *Deleted

## 2024-07-01 VITALS — BP 102/54 | HR 72 | Temp 98.3°F | Resp 16 | Ht 64.0 in

## 2024-07-01 DIAGNOSIS — M810 Age-related osteoporosis without current pathological fracture: Secondary | ICD-10-CM

## 2024-07-01 DIAGNOSIS — Z0000 Encounter for general adult medical examination without abnormal findings: Secondary | ICD-10-CM | POA: Diagnosis not present

## 2024-07-01 MED ORDER — DENOSUMAB 60 MG/ML ~~LOC~~ SOSY: 60.0000 mg | PREFILLED_SYRINGE | SUBCUTANEOUS | Status: AC

## 2024-07-01 NOTE — Progress Notes (Signed)
 Patient here for Prolia  injection per physicians orders  Prolia  60 mg SQ , was administered left arm today. Patient tolerated injection.  Patient next injection due: 6 months, appt made:  No- will schedule in 5 months after benefits are ran again  Initial injection: yes  Did Prolia  come from pharmacy (if yes please select patient supplied): no  Cam placed for next injection: yes

## 2024-07-01 NOTE — Progress Notes (Signed)
 Please attest this visit in the absence of patient primary care provider.    Subjective:   RESHONDA Gould is a 89 y.o. who presents for a Medicare Wellness preventive visit.  As a reminder, Annual Wellness Visits don't include a physical exam, and some assessments may be limited, especially if this visit is performed virtually. We may recommend an in-person follow-up visit with your provider if needed.  Visit Complete: In person  Persons Participating in Visit: Patient assisted by grandson.  AWV Questionnaire: Yes: Patient Medicare AWV questionnaire was completed by the patient on 06/24/24; I have confirmed that all information answered by patient is correct and no changes since this date.  Cardiac Risk Factors include: advanced age (>88men, >28 women);diabetes mellitus;dyslipidemia;hypertension;Other (see comment), Risk factor comments: CHF, CAD     Objective:    Today's Vitals   07/01/24 1251  BP: (!) 102/54  Pulse: 72  Resp: 16  Temp: 98.3 F (36.8 C)  TempSrc: Oral  SpO2: 100%  Height: 5' 4 (1.626 m)   Body mass index is 29.55 kg/m.     07/01/2024    2:35 PM 06/16/2024    1:54 PM 02/18/2024    1:49 PM 10/19/2023    1:47 PM 06/28/2023    3:50 PM 06/18/2023    3:36 PM 12/26/2022    1:54 PM  Advanced Directives  Does Patient Have a Medical Advance Directive? No No No No No No No  Would patient like information on creating a medical advance directive? No - Patient declined No - Patient declined   No - Patient declined No - Patient declined     Current Medications (verified) Outpatient Encounter Medications as of 07/01/2024  Medication Sig   Alpha-Lipoic Acid 600 MG CAPS Take 1,200 mg by mouth daily.   amitriptyline  (ELAVIL ) 25 MG tablet Take 1 tablet (25 mg total) by mouth at bedtime.   Ascorbic Acid  (VITAMIN C WITH ROSE HIPS) 500 MG tablet Take 500 mg by mouth in the morning and at bedtime.   atorvastatin  (LIPITOR) 40 MG tablet Take 40 mg by mouth daily.   BERBERINE  CHLORIDE PO Take by mouth. 1 every morning, 2 at bedtime. OTC   carvedilol  (COREG ) 12.5 MG tablet Take 12.5 mg by mouth 2 (two) times daily with a meal.   Cholecalciferol  (VITAMIN D3) 5000 UNITS CAPS Take 5,000 Units by mouth every evening. Reported on 11/24/2015   CINNAMON PO Take 2,000 mg by mouth daily.   clopidogrel  (PLAVIX ) 75 MG tablet Take 1 tablet (75 mg total) by mouth at bedtime. Restart on 12/04/2020   Cranberry 200 MG CAPS    Cyanocobalamin  (VITAMIN B-12) 5000 MCG SUBL Place 5,000 mcg under the tongue every other day.   dicyclomine  (BENTYL ) 10 MG capsule TAKE 1 CAPSULE BY MOUTH THREE TIMES DAILY BEFORE MEALS   donepezil  (ARICEPT ) 10 MG tablet Take 1 tablet (10 mg total) by mouth at bedtime.   famotidine  (PEPCID ) 20 MG tablet Take 1 tablet (20 mg total) by mouth 2 (two) times daily.   furosemide  (LASIX ) 20 MG tablet Take 20 mg by mouth daily.   HYDROcodone -acetaminophen  (NORCO) 10-325 MG tablet Take 1 tablet by mouth every 6 (six) hours as needed.   hydrocortisone  (ANUSOL -HC) 25 MG suppository Place 1 suppository (25 mg total) rectally 2 (two) times daily. As needed   hydrocortisone  2.5 % cream APPLY TOPICALLY TO THE AFFECTED AREA TWICE DAILY   ketoconazole  (NIZORAL ) 2 % cream Apply 1 Application topically 2 (two) times daily.  loperamide  (IMODIUM  A-D) 2 MG tablet Take 2 mg by mouth as needed for diarrhea or loose stools.   MegaRed Omega-3 Krill Oil 500 MG CAPS Restart on 12/04/2020   methenamine  (HIPREX ) 1 g tablet Take 0.5 tablets by mouth daily.   Multiple Vitamins-Minerals (PRESERVISION AREDS 2) CAPS Take 1 capsule by mouth 2 (two) times daily.   naloxone  (NARCAN ) nasal spray 4 mg/0.1 mL Place 1 spray into the nose once as needed for up to 1 dose.   nitroGLYCERIN  (NITROSTAT ) 0.4 MG SL tablet Place 1 tablet (0.4 mg total) under the tongue every 5 (five) minutes x 3 doses as needed for chest pain.   omeprazole  (PRILOSEC) 20 MG capsule Take 1 capsule (20 mg total) by mouth daily. As  needed   ondansetron  (ZOFRAN ) 4 MG tablet Take 1 tablet (4 mg total) by mouth every 8 (eight) hours as needed for nausea or vomiting.   Polyvinyl Alcohol -Povidone (REFRESH OP) Place 1 drop into both eyes 3 (three) times daily as needed (dry eyes).    pramipexole  (MIRAPEX ) 0.125 MG tablet Take 1-2 tablets (0.125-0.25 mg total) by mouth at bedtime as needed.   Prenatal Vit-Fe Fumarate-FA (PRENATAL MULTIVITAMIN) TABS tablet Take 1 tablet by mouth daily at 12 noon.   Probiotic Product (PROBIOTIC PEARLS PO) Take 1 tablet by mouth daily.   rivaroxaban  (XARELTO ) 2.5 MG TABS tablet Take 1 tablet (2.5 mg total) by mouth 2 (two) times daily. Restart on 12/04/2020   silver  sulfADIAZINE  (SILVADENE ) 1 % cream Apply 1 application topically daily.   vitamin E  180 MG (400 UNITS) capsule Take 400 Units by mouth daily.   Zinc Sulfate 66 MG TABS    [DISCONTINUED] Barberry-Oreg Grape-Goldenseal (BERBERINE COMPLEX PO) Take by mouth.   diclofenac  Sodium (VOLTAREN ) 1 % GEL Apply 4 g topically 4 (four) times daily. (Patient not taking: Reported on 07/01/2024)   [DISCONTINUED] hydrocortisone -pramoxine (ANALPRAM-HC) 2.5-1 % rectal cream APPLY RECTALLY TO THE AFFECTED AREA IN THE MORNING AND AT BEDTIME AS NEEDED   [DISCONTINUED] Lancets (FREESTYLE) lancets CHECK BLOOD SUGAR TWICE DAILY (Patient not taking: Reported on 05/27/2024)   [DISCONTINUED] OVER THE COUNTER MEDICATION 1 tablet 3 (three) times daily. Berberorine Gluco Defense   Facility-Administered Encounter Medications as of 07/01/2024  Medication   [COMPLETED] denosumab  (PROLIA ) injection 60 mg    Allergies (verified) Metformin  and related, Cymbalta [duloxetine hcl], Gabapentin, Silicone, Tramadol, Adhesive [tape], Cefazolin, Ciprofloxacin , Clorazepate dipotassium, Dilaudid  [hydromorphone  hcl], Doxycycline, Enablex [darifenacin hydrobromide er], Levofloxacin, Lyrica [pregabalin], Methadone hcl, Morphine  and codeine, and Talwin [pentazocine]   History: Past Medical  History:  Diagnosis Date   Allergic rhinitis    Arthritis    back; ankles; hands; knees (10/31/2013)   Bilateral sensorineural hearing loss    CAD (coronary artery disease)    a. Nonobst in 2011. b. Abnormal nuc 09/2013 -> s/p cutting balloon to D2, mild LAD disease; c. 08/2015 MV: no ischemia, EF 71%.   Carcinoma in situ in a polyp 1994   a. 1994 - malignant polyp removed during colonoscopy.   Chronic diastolic CHF (congestive heart failure) (HCC)    a. 09/2013 Echo: EF 60-65%, mild LVH, Gr1 DD.   Dementia (HCC)    Diverticulosis    DJD (degenerative joint disease)    Fatty liver    GERD (gastroesophageal reflux disease)    a. Hx GERD/esophageal dysmotility followed by Dr. Obie.    Hemorrhoids    Hiatal hernia    a. s/p Nissen fundoplication 2011.   Hyperlipidemia    a.  patient unwilling to use statins.   Hypertensive heart disease    IBS (irritable bowel syndrome)    Macular degeneration 03/2009   Dr. Nicholaus   Neuropathy    a. Hands, feet, legs.   Orthostatic hypotension    Osteomyelitis (HCC)    a. 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 01/2016 s/p RLE transtibial amputation 2/2 Charcot rocker-bottom deformity and insensate neuropathy ulceration.   Osteoporosis    PE (pulmonary embolism) 12/2007   a. PE/DVT after neck surgery 2009. b. coumadin  d/c 10-2008.   PONV (postoperative nausea and vomiting) 2009   neck surgery   PPD positive    Recurrent UTI    RSD (reflex sympathetic dystrophy)    a. Chronic pain.   Spinal stenosis    Type II diabetes mellitus (HCC)    no on medication   Venous insufficiency    a. Contributing to LEE.   Wound of left foot    Past Surgical History:  Procedure Laterality Date   AMPUTATION  03/06/2012   Procedure: AMPUTATION FOOT;  Surgeon: Jerona LULLA Sage, MD;  Location: [facility] OR;  Service: Orthopedics;  Laterality: Left;  FIFTH RAY AMPUTATION    AMPUTATION Right 02/18/2016   Procedure:  AMPUTATION BELOW KNEE;  Surgeon: Jerona Sage LULLA, MD;  Location: [facility] OR;  Service: Orthopedics;  Laterality: Right;   AMPUTATION Left 11/22/2016   Procedure: Amputation 4th Toe Left Foot at Metatarsophalangeal Joint;  Surgeon: Jerona Sage LULLA, MD;  Location: [facility] OR;  Service: Orthopedics;  Laterality: Left;   ANKLE FUSION  09/27/2012   Procedure: ANKLE FUSION;  Surgeon: Jerona LULLA Sage, MD;  Location: [facility] OR;  Service: Orthopedics;  Laterality: Left;  Left Tibiocalcaneal Fusion   ANKLE FUSION Right 05/09/2013   Procedure: ANKLE FUSION;  Surgeon: Jerona LULLA Sage, MD;  Location: [facility] OR;  Service: Orthopedics;  Laterality: Right;  Excision Osteomyelitis Base 1st MT Right Foot, Fusion Medial Column   ANTERIOR CERVICAL DECOMP/DISCECTOMY FUSION  12/18/07   For OA,  Dr. Barbarann:  fu by a PE   ANTERIOR CERVICAL DECOMP/DISCECTOMY FUSION N/A 11/29/2020   Procedure: Anterior Cervical Decompression Fusion - Cervical Three-Cerivcal Four;  Surgeon: Louis Shove, MD;  Location: [facility] OR;  Service: Neurosurgery;  Laterality: N/A;  3C   CARDIAC CATHETERIZATION  05/2010    at [facility] TUNNEL RELEASE Left 09/2011   CATARACT EXTRACTION W/ INTRAOCULAR LENS  IMPLANT, BILATERAL  2004   feb 2004 left, aug 2004 right   CHOLECYSTECTOMY  03/2002   CORONARY ANGIOPLASTY  10/31/2013   CORONARY ARTERY BYPASS GRAFT  09/27/2016   CYST REMOVAL HAND  06/2003   FOOT BONE EXCISION Right 06/2009   LAPAROSCOPIC RIGHT HEMI COLECTOMY N/A 01/08/2015   Procedure: LAPAROSCOPIC ASSISTED RIGHT HEMI COLECTOMY;  Surgeon: Donnice Lunger, MD;  Location: [facility] ORS;  Service: General;  Laterality: N/A;   LEFT HEART CATHETERIZATION WITH CORONARY ANGIOGRAM N/A 10/31/2013   Procedure: LEFT HEART CATHETERIZATION WITH CORONARY ANGIOGRAM;  Surgeon: Candyce GORMAN Reek, MD;  Location: [facility] CATH LAB;  Service: Cardiovascular;  Laterality: N/A;   NASAL SEPTUM SURGERY  09/1963   NISSEN FUNDOPLICATION  01/25/2010   PERCUTANEOUS CORONARY INTERVENTION-BALLOON ONLY  10/31/2013    Procedure: PERCUTANEOUS CORONARY INTERVENTION-BALLOON ONLY;  Surgeon: Candyce GORMAN Reek, MD;  Location: [facility] CATH LAB;  Service: Cardiovascular;;   ROTATOR CUFF REPAIR Right 07/2007   Dr. Beverley   ROTATOR CUFF REPAIR Left 11/2004   stent in left  leg, behind knee  06/27/2017   x2, done at [facility] in [facility]   TOE AMPUTATION Right 09-30-2008   3rd toe, Dr. Harden due to osteomyelitis   VAGINAL HYSTERECTOMY  03/1975   VEIN LIGATION Bilateral 03/1966   VENA CAVA FILTER PLACEMENT  01/2010   green filter; due to blood clots   WISDOM TOOTH EXTRACTION  06/2007   Family History  Problem Relation Age of Onset   Heart disease Mother        mitral valve replaced   Arthritis Mother    Diabetic kidney disease Daughter    Diabetes Father    Stroke Father    Migraines Sister    Hypertension Other    Breast cancer Other    Colon cancer Neg Hx    Esophageal cancer Neg Hx    Stomach cancer Neg Hx    Rectal cancer Neg Hx    Social History   Socioeconomic History   Marital status: Divorced    Spouse name: Not on file   Number of children: 3   Years of education: 15   Highest education level: 12th grade  Occupational History   Occupation: Retired     Comment: from social services   Tobacco Use   Smoking status: Never   Smokeless tobacco: Never   Tobacco comments:    tried for a few months in college.   Vaping Use   Vaping status: Never Used  Substance and Sexual Activity   Alcohol  use: No    Alcohol /week: 0.0 standard drinks of alcohol    Drug use: No   Sexual activity: Not Currently    Birth control/protection: Post-menopausal  Other Topics Concern   Not on file  Social History Narrative   Daughter Karna deceased 30-Sep-2020   daughter lives in town   1 son in [place], [place]   Caffeine use:  Tea 1/day w/ dinner   Coffee occass    Currently lives in [place] with son Keishawna Carranza.   Social Drivers of Corporate investment banker Strain: Low Risk  (07/01/2024)   Overall  Financial Resource Strain (CARDIA)    Difficulty of Paying Living Expenses: Not very hard  Food Insecurity: No Food Insecurity (07/01/2024)   Hunger Vital Sign    Worried About Running Out of Food in the Last Year: Never true    Ran Out of Food in the Last Year: Never true  Transportation Needs: No Transportation Needs (07/01/2024)   PRAPARE - Administrator, Civil Service (Medical): No    Lack of Transportation (Non-Medical): No  Physical Activity: Inactive (07/01/2024)   Exercise Vital Sign    Days of Exercise per Week: 0 days    Minutes of Exercise per Session: 0 min  Stress: Stress Concern Present (07/01/2024)   [employer] of Occupational Health - Occupational Stress Questionnaire    Feeling of Stress: To some extent  Social Connections: Socially Isolated (07/01/2024)   Social Connection and Isolation Panel    Frequency of Communication with Friends and Family: Once a week    Frequency of Social Gatherings with Friends and Family: More than three times a week    Attends Religious Services: Never    Database administrator or Organizations: No    Attends Banker Meetings: Never    Marital Status: Divorced    Tobacco Counseling Counseling given: Not Answered Tobacco comments: tried for a few months in college.     Clinical Intake:  Pre-visit preparation completed: Yes        Diabetes: Yes CBG done?: No Did pt. bring in CBG monitor from home?: No  Lab Results  Component Value Date   HGBA1C 5.3 02/18/2024   HGBA1C 5.2 03/21/2023   HGBA1C 5.4 07/03/2022     How often do you need to have someone help you when you read instructions, pamphlets, or other written materials from your doctor or pharmacy?: 4 - Often (has dementia)  Interpreter Needed?: No  Information entered by :: Kaamil Morefield, CMA(AAMA)   Activities of Daily Living     06/24/2024   10:38 AM 05/27/2024    2:32 PM  In your present state of health, do you have any difficulty  performing the following activities:  Hearing? 1 0  Comment did not like hearing aids   Vision? 1 0  Difficulty concentrating or making decisions? 1 0  Comment has dementia   Walking or climbing stairs? 1 1  Comment wheelchair, amputee   Dressing or bathing? 0 1  Doing errands, shopping? 1 1  Preparing Food and eating ? N   Using the Toilet? N   In the past six months, have you accidently leaked urine? Y   Comment has been discussed with PCP   Do you have problems with loss of bowel control? Y   Comment currently taking immodium and pepto   Managing your Medications? N   Managing your Finances? N   Housekeeping or managing your Housekeeping? N     Patient Care Team: Amon Aloysius BRAVO, MD as PCP - General Ines Onetha NOVAK, MD as Consulting Physician (Neurology) Harden Jerona GAILS, MD as Consulting Physician (Orthopedic Surgery) Lynwood Ruths, MD as Consulting Physician (Vascular Surgery) Ovidio Fairly, MD as Consulting Physician (Cardiology) Nieves Cough, MD as Consulting Physician (Urology) Charmayne Molly, MD as Consulting Physician (Ophthalmology)  I have updated your Care Teams any recent Medical Services you may have received from other providers in the past year.     Assessment:   This is a routine wellness examination for Raynesha.  Hearing/Vision screen Hearing Screening - Comments:: Needs hearing aids but wouldn't wear them. Vision Screening - Comments:: Wears RX glasses -- up to date with routine eye exams.    Goals Addressed   None    Depression Screen     05/27/2024    3:07 PM 02/18/2024    3:06 PM 10/22/2023    1:59 PM 06/28/2023    4:21 PM 06/22/2023    3:08 PM 03/21/2023    2:47 PM 11/21/2022    1:43 PM  PHQ 2/9 Scores  PHQ - 2 Score 5 1 1 1 1  0 0  PHQ- 9 Score 14 12 5 9 9     Pt reported no change in depression screen from 05/27/24.  Fall Risk     06/24/2024   10:38 AM 05/27/2024    2:32 PM 02/18/2024    3:06 PM 10/22/2023    1:27 PM 06/28/2023    3:49 PM   Fall Risk   Falls in the past year? 0 1 1 1 1   Number falls in past yr: 0 0 0 0 1  Injury with Fall? 0 0 0 0 0  Risk for fall due to : Orthopedic patient;History of fall(s);Impaired mobility    Impaired balance/gait  Follow up Education provided Falls evaluation completed;Education provided Education provided;Falls evaluation completed Falls evaluation completed;Education provided Falls evaluation completed    MEDICARE RISK AT HOME:  Medicare Risk at  Home Any stairs in or around the home?: (Patient-Rptd) Yes If so, are there any without handrails?: (Patient-Rptd) No Home free of loose throw rugs in walkways, pet beds, electrical cords, etc?: (Patient-Rptd) Yes Adequate lighting in your home to reduce risk of falls?: (Patient-Rptd) Yes Life alert?: (Patient-Rptd) No Use of a cane, walker or w/c?: (Patient-Rptd) Yes Grab bars in the bathroom?: (Patient-Rptd) Yes Shower chair or bench in shower?: (Patient-Rptd) Yes Elevated toilet seat or a handicapped toilet?: (Patient-Rptd) Yes  TIMED UP AND GO:  Was the test performed?  No, wheelchair  Cognitive Function: Impaired: Patient has current diagnosis of cognitive impairment.    10/28/2021    3:43 PM 12/14/2016   12:27 PM  MMSE - Mini Mental State Exam  Orientation to time 4 5   Orientation to Place 5 5   Registration 3 3   Attention/ Calculation 2 5   Recall 1 2   Language- name 2 objects 1 2   Language- repeat 1 1  Language- follow 3 step command 3 3   Language- read & follow direction 1 1   Write a sentence 1 1   Copy design 1 1   Total score 23 29      Data saved with a previous flowsheet row definition        06/28/2023    3:52 PM 02/24/2021    4:02 PM  6CIT Screen  What Year? 4 points 0 points  What month? 0 points 3 points  What time? 3 points 0 points  Count back from 20 0 points 0 points  Months in reverse 4 points 2 points  Repeat phrase 8 points 8 points  Total Score 19 points 13 points     Immunizations Immunization History  Administered Date(s) Administered   Fluad Quad(high Dose 65+) 07/11/2019, 09/22/2020, 07/27/2021, 07/03/2022   INFLUENZA, HIGH DOSE SEASONAL PF 10/15/2015, 09/18/2016, 07/23/2017, 08/21/2018   Influenza Split 08/07/2012   Influenza Whole 07/22/2008, 07/26/2009   Influenza, Seasonal, Injecte, Preservative Fre 07/23/2013   Influenza,inj,Quad PF,6+ Mos 10/06/2014   Influenza-Unspecified 08/14/2023   PFIZER(Purple Top)SARS-COV-2 Vaccination 12/27/2019, 01/31/2020, 11/01/2020   PNEUMOCOCCAL CONJUGATE-20 07/03/2022   Pneumococcal Conjugate-13 10/06/2014   Pneumococcal Polysaccharide-23 09/28/2010   RSV,unspecified 08/14/2023   Tdap 08/14/2023   Tetanus 09/17/2013   Zoster Recombinant(Shingrix) 10/11/2018, 12/12/2018   Zoster, Live 09/18/2011    Screening Tests Health Maintenance  Topic Date Due   COVID-19 Vaccine (4 - 2025-26 season) 06/23/2024   Medicare Annual Wellness (AWV)  06/27/2024   Influenza Vaccine  01/20/2025 (Originally 05/23/2024)   HEMOGLOBIN A1C  08/19/2024   OPHTHALMOLOGY EXAM  10/03/2024   DTaP/Tdap/Td (2 - Td or Tdap) 08/13/2033   Pneumococcal Vaccine: 50+ Years  Completed   DEXA SCAN  Completed   Zoster Vaccines- Shingrix  Completed   HPV VACCINES  Aged Out   Meningococcal B Vaccine  Aged Out   Colonoscopy  Discontinued    Health Maintenance Items Addressed: Will get flu vaccine in October. DEXA ordered.  Additional Screening:  Vision Screening: Recommended annual ophthalmology exams for early detection of glaucoma and other disorders of the eye. Is the patient up to date with their annual eye exam?  Yes  Who is the provider or what is the name of the office in which the patient attends annual eye exams? [facility]  Dental Screening: Recommended annual dental exams for proper oral hygiene  Community Resource Referral / Chronic Care Management: CRR required this visit?  No   CCM required  this visit?   No   Plan:    I have personally reviewed and noted the following in the patient's chart:   Medical and social history Use of alcohol , tobacco or illicit drugs  Current medications and supplements including opioid prescriptions. Patient is currently taking opioid prescriptions. Information provided to patient regarding non-opioid alternatives. Patient advised to discuss non-opioid treatment plan with their provider. Functional ability and status Nutritional status Physical activity Advanced directives List of other physicians Hospitalizations, surgeries, and ER visits in previous 12 months Vitals Screenings to include cognitive, depression, and falls Referrals and appointments  In addition, I have reviewed and discussed with patient certain preventive protocols, quality metrics, and best practice recommendations. A written personalized care plan for preventive services as well as general preventive health recommendations were provided to patient.   Lolita Libra, CMA   07/01/2024   After Visit Summary: (In Person-Printed) AVS printed and given to the patient  Notes: Nothing significant to report at this time.

## 2024-07-01 NOTE — Patient Instructions (Addendum)
 Ms. Foots , Thank you for taking time out of your busy schedule to complete your Annual Wellness Visit with me. I enjoyed our conversation and look forward to speaking with you again next year. I, as well as your care team,  appreciate your ongoing commitment to your health goals. Please review the following plan we discussed and let me know if I can assist you in the future. Your Game plan/ To Do List   Referrals: If you haven't heard from the office you've been referred to, please reach out to them at the phone provided.   Bone Density ([facility]):  [phone] schedule after 03/15/25  Follow up Visits: Next Medicare AWV with our clinical staff: 07/03/25 1pm, telephone.    Next Office Visit with your provider: 08/27/24 2pm, Dr Amon  Clinician Recommendations:  Aim for 30 minutes of exercise or brisk walking, 6-8 glasses of water, and 5 servings of fruits and vegetables each day.   You will need to get the following vaccines at your local pharmacy:  Flu      This is a list of the screening recommended for you and due dates:  Health Maintenance  Topic Date Due   COVID-19 Vaccine (4 - 2025-26 season) 06/23/2024   Medicare Annual Wellness Visit  06/27/2024   Flu Shot  01/20/2025*   Hemoglobin A1C  08/19/2024   Eye exam for diabetics  10/03/2024   DTaP/Tdap/Td vaccine (2 - Td or Tdap) 08/13/2033   Pneumococcal Vaccine for age over 107  Completed   DEXA scan (bone density measurement)  Completed   Zoster (Shingles) Vaccine  Completed   HPV Vaccine  Aged Out   Meningitis B Vaccine  Aged Out   Colon Cancer Screening  Discontinued  *Topic was postponed. The date shown is not the original due date.    Advanced directives: (Declined) Advance directive discussed with you today. Even though you declined this today, please call our office should you change your mind, and we can give you the proper paperwork for you to fill out. Advance Care Planning is important because it:  [x]   Makes sure you receive the medical care that is consistent with your values, goals, and preferences  [x]  It provides guidance to your family and loved ones and reduces their decisional burden about whether or not they are making the right decisions based on your wishes.  Follow the link provided in your after visit summary or read over the paperwork we have mailed to you to help you started getting your Advance Directives in place. If you need assistance in completing these, please reach out to us  so that we can help you!  See attachments for Preventive Care and Fall Prevention Tips.

## 2024-07-07 ENCOUNTER — Other Ambulatory Visit: Payer: Self-pay | Admitting: Internal Medicine

## 2024-07-07 DIAGNOSIS — E785 Hyperlipidemia, unspecified: Secondary | ICD-10-CM | POA: Diagnosis not present

## 2024-07-07 DIAGNOSIS — I70213 Atherosclerosis of native arteries of extremities with intermittent claudication, bilateral legs: Secondary | ICD-10-CM | POA: Diagnosis not present

## 2024-07-07 DIAGNOSIS — E1152 Type 2 diabetes mellitus with diabetic peripheral angiopathy with gangrene: Secondary | ICD-10-CM | POA: Diagnosis not present

## 2024-07-07 DIAGNOSIS — I7025 Atherosclerosis of native arteries of other extremities with ulceration: Secondary | ICD-10-CM | POA: Diagnosis not present

## 2024-07-15 DIAGNOSIS — Z951 Presence of aortocoronary bypass graft: Secondary | ICD-10-CM | POA: Diagnosis not present

## 2024-07-15 DIAGNOSIS — N179 Acute kidney failure, unspecified: Secondary | ICD-10-CM | POA: Diagnosis not present

## 2024-07-15 DIAGNOSIS — Z79899 Other long term (current) drug therapy: Secondary | ICD-10-CM | POA: Diagnosis not present

## 2024-07-15 DIAGNOSIS — R0602 Shortness of breath: Secondary | ICD-10-CM | POA: Diagnosis not present

## 2024-07-15 DIAGNOSIS — R0789 Other chest pain: Secondary | ICD-10-CM | POA: Diagnosis not present

## 2024-07-15 DIAGNOSIS — R079 Chest pain, unspecified: Secondary | ICD-10-CM | POA: Diagnosis not present

## 2024-07-15 DIAGNOSIS — Z743 Need for continuous supervision: Secondary | ICD-10-CM | POA: Diagnosis not present

## 2024-07-15 DIAGNOSIS — I2510 Atherosclerotic heart disease of native coronary artery without angina pectoris: Secondary | ICD-10-CM | POA: Diagnosis not present

## 2024-07-15 DIAGNOSIS — Z7982 Long term (current) use of aspirin: Secondary | ICD-10-CM | POA: Diagnosis not present

## 2024-07-15 DIAGNOSIS — E119 Type 2 diabetes mellitus without complications: Secondary | ICD-10-CM | POA: Diagnosis not present

## 2024-07-31 ENCOUNTER — Ambulatory Visit

## 2024-08-18 ENCOUNTER — Inpatient Hospital Stay

## 2024-08-18 ENCOUNTER — Ambulatory Visit: Admitting: Family

## 2024-08-25 ENCOUNTER — Emergency Department (HOSPITAL_BASED_OUTPATIENT_CLINIC_OR_DEPARTMENT_OTHER)

## 2024-08-25 ENCOUNTER — Other Ambulatory Visit: Payer: Self-pay

## 2024-08-25 ENCOUNTER — Encounter: Payer: Self-pay | Admitting: Radiology

## 2024-08-25 ENCOUNTER — Inpatient Hospital Stay (HOSPITAL_BASED_OUTPATIENT_CLINIC_OR_DEPARTMENT_OTHER)
Admission: EM | Admit: 2024-08-25 | Discharge: 2024-08-28 | DRG: 689 | Disposition: A | Discharge status: Home / Self Care | Attending: Internal Medicine | Admitting: Internal Medicine

## 2024-08-25 ENCOUNTER — Encounter (HOSPITAL_BASED_OUTPATIENT_CLINIC_OR_DEPARTMENT_OTHER): Payer: Self-pay

## 2024-08-25 DIAGNOSIS — E785 Hyperlipidemia, unspecified: Secondary | ICD-10-CM | POA: Diagnosis present

## 2024-08-25 DIAGNOSIS — Z87440 Personal history of urinary (tract) infections: Secondary | ICD-10-CM

## 2024-08-25 DIAGNOSIS — Z823 Family history of stroke: Secondary | ICD-10-CM

## 2024-08-25 DIAGNOSIS — E8721 Acute metabolic acidosis: Secondary | ICD-10-CM | POA: Diagnosis present

## 2024-08-25 DIAGNOSIS — Z7902 Long term (current) use of antithrombotics/antiplatelets: Secondary | ICD-10-CM

## 2024-08-25 DIAGNOSIS — R4182 Altered mental status, unspecified: Secondary | ICD-10-CM

## 2024-08-25 DIAGNOSIS — L89620 Pressure ulcer of left heel, unstageable: Secondary | ICD-10-CM | POA: Diagnosis present

## 2024-08-25 DIAGNOSIS — R410 Disorientation, unspecified: Secondary | ICD-10-CM | POA: Diagnosis not present

## 2024-08-25 DIAGNOSIS — Z961 Presence of intraocular lens: Secondary | ICD-10-CM | POA: Diagnosis present

## 2024-08-25 DIAGNOSIS — G9341 Metabolic encephalopathy: Secondary | ICD-10-CM | POA: Diagnosis present

## 2024-08-25 DIAGNOSIS — N1832 Chronic kidney disease, stage 3b: Secondary | ICD-10-CM | POA: Diagnosis present

## 2024-08-25 DIAGNOSIS — Z79899 Other long term (current) drug therapy: Secondary | ICD-10-CM

## 2024-08-25 DIAGNOSIS — Z8261 Family history of arthritis: Secondary | ICD-10-CM

## 2024-08-25 DIAGNOSIS — Z90710 Acquired absence of both cervix and uterus: Secondary | ICD-10-CM

## 2024-08-25 DIAGNOSIS — Z803 Family history of malignant neoplasm of breast: Secondary | ICD-10-CM

## 2024-08-25 DIAGNOSIS — I2510 Atherosclerotic heart disease of native coronary artery without angina pectoris: Secondary | ICD-10-CM | POA: Diagnosis present

## 2024-08-25 DIAGNOSIS — F0390 Unspecified dementia without behavioral disturbance: Secondary | ICD-10-CM | POA: Diagnosis present

## 2024-08-25 DIAGNOSIS — Z91048 Other nonmedicinal substance allergy status: Secondary | ICD-10-CM

## 2024-08-25 DIAGNOSIS — Z951 Presence of aortocoronary bypass graft: Secondary | ICD-10-CM

## 2024-08-25 DIAGNOSIS — Z888 Allergy status to other drugs, medicaments and biological substances status: Secondary | ICD-10-CM

## 2024-08-25 DIAGNOSIS — K5730 Diverticulosis of large intestine without perforation or abscess without bleeding: Secondary | ICD-10-CM | POA: Diagnosis not present

## 2024-08-25 DIAGNOSIS — E1151 Type 2 diabetes mellitus with diabetic peripheral angiopathy without gangrene: Secondary | ICD-10-CM | POA: Diagnosis present

## 2024-08-25 DIAGNOSIS — N179 Acute kidney failure, unspecified: Secondary | ICD-10-CM | POA: Diagnosis present

## 2024-08-25 DIAGNOSIS — Z881 Allergy status to other antibiotic agents status: Secondary | ICD-10-CM

## 2024-08-25 DIAGNOSIS — Z885 Allergy status to narcotic agent status: Secondary | ICD-10-CM

## 2024-08-25 DIAGNOSIS — N390 Urinary tract infection, site not specified: Principal | ICD-10-CM | POA: Diagnosis present

## 2024-08-25 DIAGNOSIS — Z515 Encounter for palliative care: Secondary | ICD-10-CM | POA: Diagnosis not present

## 2024-08-25 DIAGNOSIS — I130 Hypertensive heart and chronic kidney disease with heart failure and stage 1 through stage 4 chronic kidney disease, or unspecified chronic kidney disease: Secondary | ICD-10-CM | POA: Diagnosis present

## 2024-08-25 DIAGNOSIS — E1140 Type 2 diabetes mellitus with diabetic neuropathy, unspecified: Secondary | ICD-10-CM | POA: Diagnosis present

## 2024-08-25 DIAGNOSIS — D631 Anemia in chronic kidney disease: Secondary | ICD-10-CM | POA: Diagnosis present

## 2024-08-25 DIAGNOSIS — Z9861 Coronary angioplasty status: Secondary | ICD-10-CM

## 2024-08-25 DIAGNOSIS — Z86711 Personal history of pulmonary embolism: Secondary | ICD-10-CM

## 2024-08-25 DIAGNOSIS — D696 Thrombocytopenia, unspecified: Secondary | ICD-10-CM | POA: Diagnosis present

## 2024-08-25 DIAGNOSIS — K449 Diaphragmatic hernia without obstruction or gangrene: Secondary | ICD-10-CM | POA: Diagnosis not present

## 2024-08-25 DIAGNOSIS — Z7901 Long term (current) use of anticoagulants: Secondary | ICD-10-CM | POA: Diagnosis not present

## 2024-08-25 DIAGNOSIS — R109 Unspecified abdominal pain: Secondary | ICD-10-CM | POA: Diagnosis not present

## 2024-08-25 DIAGNOSIS — I5032 Chronic diastolic (congestive) heart failure: Secondary | ICD-10-CM | POA: Diagnosis present

## 2024-08-25 DIAGNOSIS — E1122 Type 2 diabetes mellitus with diabetic chronic kidney disease: Secondary | ICD-10-CM | POA: Diagnosis present

## 2024-08-25 DIAGNOSIS — Z8249 Family history of ischemic heart disease and other diseases of the circulatory system: Secondary | ICD-10-CM

## 2024-08-25 DIAGNOSIS — B9689 Other specified bacterial agents as the cause of diseases classified elsewhere: Secondary | ICD-10-CM | POA: Diagnosis present

## 2024-08-25 DIAGNOSIS — H903 Sensorineural hearing loss, bilateral: Secondary | ICD-10-CM | POA: Diagnosis present

## 2024-08-25 DIAGNOSIS — Z833 Family history of diabetes mellitus: Secondary | ICD-10-CM

## 2024-08-25 DIAGNOSIS — Z89511 Acquired absence of right leg below knee: Secondary | ICD-10-CM | POA: Diagnosis not present

## 2024-08-25 DIAGNOSIS — Z743 Need for continuous supervision: Secondary | ICD-10-CM | POA: Diagnosis not present

## 2024-08-25 DIAGNOSIS — K219 Gastro-esophageal reflux disease without esophagitis: Secondary | ICD-10-CM | POA: Diagnosis present

## 2024-08-25 DIAGNOSIS — Z981 Arthrodesis status: Secondary | ICD-10-CM

## 2024-08-25 DIAGNOSIS — I959 Hypotension, unspecified: Secondary | ICD-10-CM | POA: Diagnosis not present

## 2024-08-25 DIAGNOSIS — Z9842 Cataract extraction status, left eye: Secondary | ICD-10-CM

## 2024-08-25 DIAGNOSIS — Z9841 Cataract extraction status, right eye: Secondary | ICD-10-CM

## 2024-08-25 DIAGNOSIS — Z86004 Personal history of in-situ neoplasm of other and unspecified digestive organs: Secondary | ICD-10-CM

## 2024-08-25 DIAGNOSIS — Z9049 Acquired absence of other specified parts of digestive tract: Secondary | ICD-10-CM

## 2024-08-25 LAB — URINALYSIS, ROUTINE W REFLEX MICROSCOPIC
Bilirubin Urine: NEGATIVE
Glucose, UA: NEGATIVE mg/dL
Hgb urine dipstick: NEGATIVE
Ketones, ur: NEGATIVE mg/dL
Nitrite: POSITIVE — AB
Protein, ur: NEGATIVE mg/dL
Specific Gravity, Urine: 1.013 (ref 1.005–1.030)
Tyrosine Crys, UA: POSITIVE
WBC, UA: >50 WBC/hpf (ref 0–5)
pH: 5.5 (ref 5.0–8.0)

## 2024-08-25 LAB — CBC WITH DIFFERENTIAL/PLATELET
Abs Immature Granulocytes: 0.01 K/uL (ref 0.00–0.07)
Basophils Absolute: 0 K/uL (ref 0.0–0.1)
Basophils Relative: 0 %
Eosinophils Absolute: 0.1 K/uL (ref 0.0–0.5)
Eosinophils Relative: 2 %
HCT: 32.4 % — ABNORMAL LOW (ref 36.0–46.0)
Hemoglobin: 9.8 g/dL — ABNORMAL LOW (ref 12.0–15.0)
Immature Granulocytes: 0 %
Lymphocytes Relative: 25 %
Lymphs Abs: 1.2 K/uL (ref 0.7–4.0)
MCH: 30.5 pg (ref 26.0–34.0)
MCHC: 30.2 g/dL (ref 30.0–36.0)
MCV: 100.9 fL — ABNORMAL HIGH (ref 80.0–100.0)
Monocytes Absolute: 0.3 K/uL (ref 0.1–1.0)
Monocytes Relative: 7 %
Neutro Abs: 3.1 K/uL (ref 1.7–7.7)
Neutrophils Relative %: 66 %
Platelets: 126 K/uL — ABNORMAL LOW (ref 150–400)
RBC: 3.21 MIL/uL — ABNORMAL LOW (ref 3.87–5.11)
RDW: 13.8 % (ref 11.5–15.5)
WBC: 4.7 K/uL (ref 4.0–10.5)
nRBC: 0 % (ref 0.0–0.2)

## 2024-08-25 LAB — COMPREHENSIVE METABOLIC PANEL WITH GFR
ALT: 7 U/L (ref 0–44)
AST: 17 U/L (ref 15–41)
Albumin: 3.8 g/dL (ref 3.5–5.0)
Alkaline Phosphatase: 53 U/L (ref 38–126)
Anion gap: 10 (ref 5–15)
BUN: 48 mg/dL — ABNORMAL HIGH (ref 8–23)
CO2: 19 mmol/L — ABNORMAL LOW (ref 22–32)
Calcium: 9 mg/dL (ref 8.9–10.3)
Chloride: 110 mmol/L (ref 98–111)
Creatinine, Ser: 2.95 mg/dL — ABNORMAL HIGH (ref 0.44–1.00)
GFR, Estimated: 15 mL/min — ABNORMAL LOW (ref >60)
Glucose, Bld: 106 mg/dL — ABNORMAL HIGH (ref 70–99)
Potassium: 5.1 mmol/L (ref 3.5–5.1)
Sodium: 139 mmol/L (ref 135–145)
Total Bilirubin: 0.2 mg/dL (ref 0.0–1.2)
Total Protein: 5.9 g/dL — ABNORMAL LOW (ref 6.5–8.1)

## 2024-08-25 MED ORDER — AMOXICILLIN 500 MG PO CAPS: 500.0000 mg | ORAL_CAPSULE | Freq: Three times a day (TID) | ORAL | 0 refills | Status: DC

## 2024-08-25 MED ORDER — SODIUM CHLORIDE 0.9 % IV BOLUS
1000.0000 mL | Freq: Once | INTRAVENOUS | Status: AC
  Administered 2024-08-26: 1000 mL via INTRAVENOUS

## 2024-08-25 NOTE — ED Provider Notes (Signed)
 Care assumed from previous provider at shift change.  See note for full HPI.  Plan to follow-up on labs, anticipate possible DC home pending labs Physical Exam  BP (!) 137/57   Pulse 69   Temp 97.8 F (36.6 C)   Resp 16   SpO2 99%   Physical Exam Vitals and nursing note reviewed.  Constitutional:      General: She is not in acute distress.    Appearance: She is well-developed. She is not ill-appearing or diaphoretic.  HENT:     Head: Atraumatic.     Mouth/Throat:     Mouth: Mucous membranes are dry.  Eyes:     Pupils: Pupils are equal, round, and reactive to light.  Cardiovascular:     Rate and Rhythm: Normal rate and regular rhythm.  Pulmonary:     Effort: Pulmonary effort is normal. No respiratory distress.     Breath sounds: Normal breath sounds.  Abdominal:     General: Bowel sounds are normal. There is no distension.     Palpations: Abdomen is soft.     Tenderness: There is abdominal tenderness. There is no right CVA tenderness, left CVA tenderness, guarding or rebound.  Musculoskeletal:        General: Normal range of motion.     Cervical back: Normal range of motion and neck supple.  Skin:    General: Skin is warm and dry.  Neurological:     Mental Status: She is alert.     Comments: Pleasantly confused     Procedures  Procedures Labs Reviewed  URINALYSIS, ROUTINE W REFLEX MICROSCOPIC - Abnormal; Notable for the following components:      Result Value   APPearance CLOUDY (*)    Nitrite POSITIVE (*)    Leukocytes,Ua LARGE (*)    Bacteria, UA MANY (*)    All other components within normal limits  CBC WITH DIFFERENTIAL/PLATELET - Abnormal; Notable for the following components:   RBC 3.21 (*)    Hemoglobin 9.8 (*)    HCT 32.4 (*)    MCV 100.9 (*)    Platelets 126 (*)    All other components within normal limits  COMPREHENSIVE METABOLIC PANEL WITH GFR - Abnormal; Notable for the following components:   CO2 19 (*)    Glucose, Bld 106 (*)    BUN 48 (*)     Creatinine, Ser 2.95 (*)    Total Protein 5.9 (*)    GFR, Estimated 15 (*)    All other components within normal limits  URINE CULTURE   No results found.  ED Course / MDM   Clinical Course as of 08/25/24 2350  Mon Aug 25, 2024  2245 Here with UTI, FU on labs. Anticipate FU on labs and dc home likely. [BH]    Clinical Course User Index [BH] Julio Storr A, PA-C   Patient reassessed.  Discussed with family at bedside.  She has AKI here.  Family significant altered from her baseline according to family.  She typically has some confusion however today could not remember how to use the bathroom by herself we remember how to pull up her underwear.  Creatinine double here.  Family states patient does not want to eat or drink at home. Endorsing pain to RLQ. Will plan on CT scan, IVF.  Care transferred to Dr. Theadore who will follow-up on imaging and disposition  Medical Decision Making Amount and/or Complexity of Data Reviewed Independent Historian: caregiver External Data Reviewed: labs, radiology and notes.  Labs: ordered. Decision-making details documented in ED Course. Radiology: ordered and independent interpretation performed. Decision-making details documented in ED Course.  Risk OTC drugs. Prescription drug management. Decision regarding hospitalization. Diagnosis or treatment significantly limited by social determinants of health.          Lucita Montoya A, PA-C 08/25/24 2350    Doretha Folks, MD 08/26/24 1504

## 2024-08-25 NOTE — ED Triage Notes (Signed)
 Patient who has dementia at baseline has been increasingly confused, loss of appetite not even drinking her milkshakes. She says she has a burning sensation in her groin. Family notes this is how she often presents for UTI.

## 2024-08-25 NOTE — ED Provider Notes (Signed)
 [place] EMERGENCY DEPARTMENT AT [facility] Provider Note   CSN: [id number] Arrival date & time: 08/25/24  1545     Patient presents with: Urinary Tract Infection   [name] is a 89 y.o. female.   Patient is here with her son who is concerned that patient has a urinary tract infection.  He reports patient has had increased confusion and has been complaining of discomfort when she urinates.  Patient has a past medical history of urinary tract infections.  He states that she has had some increased confusion and that this is typical when she has a urinary tract infection.  Patient has not had any fever no vomiting.  Patient's son reports that she has had 2 episodes of diarrhea today.  Patient has a past medical history of dementia.  She has a history of diabetes.  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  Patient currently has a wound on her heel.    Urinary Tract Infection      Prior to Admission medications   Medication Sig Start Date End Date Taking? Authorizing Provider  Alpha-Lipoic Acid 600 MG CAPS Take 1,200 mg by mouth daily.    [provider]  amitriptyline  (ELAVIL ) 25 MG tablet Take 1 tablet (25 mg total) by mouth at bedtime. 03/28/24   Paz, Jose E, MD  Ascorbic Acid  (VITAMIN C WITH ROSE HIPS) 500 MG tablet Take 500 mg by mouth in the morning and at bedtime.    [provider]  atorvastatin  (LIPITOR) 40 MG tablet Take 40 mg by mouth daily. 12/01/18   [provider]  BERBERINE CHLORIDE PO Take by mouth. 1 every morning, 2 at bedtime. OTC    [provider]  carvedilol  (COREG ) 12.5 MG tablet Take 12.5 mg by mouth 2 (two) times daily with a meal.    [provider]  Cholecalciferol  (VITAMIN D3) 5000 UNITS CAPS Take 5,000 Units by mouth every evening. Reported on 11/24/2015    [provider]  CINNAMON PO Take 2,000 mg by mouth daily.    [provider]  clopidogrel  (PLAVIX ) 75 MG tablet Take 1 tablet (75 mg total) by mouth at bedtime. Restart on 12/04/2020 11/30/20   Bergman, Meghan D, NP  Cranberry 200 MG CAPS     [provider]  Cyanocobalamin  (VITAMIN B-12) 5000 MCG SUBL Place 5,000 mcg under the tongue every other day.    [provider]  diclofenac  Sodium (VOLTAREN ) 1 % GEL Apply 4 g topically 4 (four) times daily. Patient not taking: Reported on 07/01/2024 09/02/21   Emil Share, DO  dicyclomine  (BENTYL ) 10 MG capsule TAKE 1 CAPSULE BY MOUTH THREE TIMES DAILY BEFORE MEALS 06/11/23   Nandigam, Kavitha V, MD  donepezil  (ARICEPT ) 10 MG tablet Take 1 tablet (10 mg total) by mouth at bedtime. 08/29/23   Amon Aloysius BRAVO, MD  famotidine  (PEPCID ) 20 MG tablet Take 1 tablet (20 mg total) by mouth 2 (two) times daily. 03/28/24   Amon Aloysius BRAVO, MD  furosemide  (LASIX ) 20 MG tablet Take 20 mg by mouth daily. 11/09/16   [provider]  HYDROcodone -acetaminophen  (NORCO) 10-325 MG tablet Take 1 tablet by mouth every 6 (six) hours as needed. 05/27/24   Amon Aloysius BRAVO, MD  hydrocortisone  (ANUSOL -HC) 25 MG suppository Place 1 suppository (25 mg total) rectally 2 (two) times daily. As needed 08/14/23   Nandigam, Kavitha V, MD  hydrocortisone  2.5 % cream APPLY TOPICALLY TO THE AFFECTED AREA TWICE DAILY 01/18/23   Nandigam, Kavitha V, MD  ketoconazole  (NIZORAL ) 2 % cream Apply 1 Application topically 2 (two) times daily. 03/30/23   Amon Aloysius BRAVO, MD  loperamide  (IMODIUM  A-D) 2 MG tablet Take 2 mg by mouth as needed for diarrhea or loose stools.    [provider]  MegaRed Omega-3 Krill Oil 500 MG CAPS Restart on 12/04/2020 11/30/20   Bergman, Meghan D, NP  methenamine  (HIPREX ) 1 g tablet Take 0.5 tablets by mouth daily. 07/30/18   [provider]  Multiple Vitamins-Minerals (PRESERVISION AREDS 2) CAPS Take 1 capsule by mouth 2 (two) times daily.    [provider]  naloxone  (NARCAN ) nasal spray 4 mg/0.1 mL Place 1 spray into the nose  once as needed for up to 1 dose. 10/06/20   Amon Aloysius BRAVO, MD  nitroGLYCERIN  (NITROSTAT ) 0.4 MG SL tablet Place 1 tablet (0.4 mg total) under the tongue every 5 (five) minutes x 3 doses as needed for chest pain. 12/24/19   Amon Aloysius BRAVO, MD  omeprazole  (PRILOSEC) 20 MG capsule Take 1 capsule (20 mg total) by mouth daily. As needed 08/14/23   Nandigam, Kavitha V, MD  ondansetron  (ZOFRAN ) 4 MG tablet Take 1 tablet (4 mg total) by mouth every 8 (eight) hours as needed for nausea or vomiting. 04/18/23   Paz, Jose E, MD  Polyvinyl Alcohol -Povidone (REFRESH OP) Place 1 drop into both eyes 3 (three) times daily as needed (dry eyes).     [provider]  pramipexole  (MIRAPEX ) 0.125 MG tablet Take 1-2 tablets (0.125-0.25 mg total) by mouth at bedtime as needed. 07/07/24   Paz, Jose E, MD  Prenatal Vit-Fe Fumarate-FA (PRENATAL MULTIVITAMIN) TABS tablet Take 1 tablet by mouth daily at 12 noon.    [provider]  Probiotic Product (PROBIOTIC PEARLS PO) Take 1 tablet by mouth daily.    [provider]  rivaroxaban  (XARELTO ) 2.5 MG TABS tablet Take 1 tablet (2.5 mg total) by mouth 2 (two) times daily. Restart on 12/04/2020 11/30/20   Bergman, Meghan D, NP  silver  sulfADIAZINE  (SILVADENE ) 1 % cream Apply 1 application topically daily. 11/29/21   Valdemar Rocky SAUNDERS, NP  vitamin E  180 MG (400 UNITS) capsule Take 400 Units by mouth daily.    [provider]  Zinc Sulfate 66 MG TABS     [provider]    Allergies: Metformin  and related, Cymbalta [duloxetine hcl], Gabapentin, Silicone, Tramadol, Adhesive [tape], Cefazolin, Ciprofloxacin , Clorazepate dipotassium, Dilaudid  [hydromorphone  hcl], Doxycycline, Enablex [darifenacin hydrobromide er], Levofloxacin, Lyrica [pregabalin], Methadone hcl, Morphine  and codeine, and Talwin [pentazocine]    Review of Systems  Gastrointestinal:  Positive for diarrhea.  Genitourinary:  Positive for dysuria.  Psychiatric/Behavioral:  Positive for  confusion.   All other systems reviewed and are negative.   Updated Vital Signs BP (!) 115/58   Pulse 74   Temp 97.8 F (36.6 C)   Resp 16   SpO2 99%   Physical Exam Vitals and nursing note reviewed.  Constitutional:      Appearance: She is well-developed.  HENT:     Head: Normocephalic.     Mouth/Throat:     Mouth: Mucous membranes are moist.  Cardiovascular:     Rate and Rhythm: Normal rate and regular rhythm.  Pulmonary:     Effort: Pulmonary effort is normal.  Abdominal:     General: There is no distension.  Musculoskeletal:  General: Normal range of motion.     Cervical back: Normal range of motion.  Skin:    General: Skin is warm.  Neurological:     General: No focal deficit present.     Mental Status: She is alert and oriented to person, place, and time.  Psychiatric:        Mood and Affect: Mood normal.     (all labs ordered are listed, but only abnormal results are displayed) Labs Reviewed  URINALYSIS, ROUTINE W REFLEX MICROSCOPIC - Abnormal; Notable for the following components:      Result Value   APPearance CLOUDY (*)    Nitrite POSITIVE (*)    Leukocytes,Ua LARGE (*)    Bacteria, UA MANY (*)    All other components within normal limits  CBC WITH DIFFERENTIAL/PLATELET  COMPREHENSIVE METABOLIC PANEL WITH GFR    EKG: None  Radiology: No results found.   Procedures   Medications Ordered in the ED - No data to display  Clinical Course as of 08/25/24 2246  Mon Aug 25, 2024  2245 Here with UTI, FU on labs. Anticipate FU on labs and dc home likely. [BH]    Clinical Course User Index [BH] Henderly, Britni A, PA-C                                 Medical Decision Making Patient's son is concerned that she has a urinary tract infection.  Amount and/or Complexity of Data Reviewed Independent Historian:     Details: Patient's son provides her history Labs: ordered. Decision-making details documented in ED Course.    Details: UA shows  greater than 50 white blood cells  Risk Prescription drug management. Risk Details: Pt[facility] care turned over to [facility].  Labs pending         Final diagnoses:  Urinary tract infection without hematuria, site unspecified    ED Discharge Orders          Ordered    amoxicillin  (AMOXIL ) 500 MG capsule  3 times daily        08/25/24 2244               Toniya Rozar K, PA-C 08/25/24 2247    Doretha Folks, MD 08/25/24 2320

## 2024-08-26 ENCOUNTER — Encounter: Payer: Self-pay | Admitting: Family

## 2024-08-26 DIAGNOSIS — E8721 Acute metabolic acidosis: Secondary | ICD-10-CM | POA: Diagnosis present

## 2024-08-26 DIAGNOSIS — Z515 Encounter for palliative care: Secondary | ICD-10-CM | POA: Diagnosis not present

## 2024-08-26 DIAGNOSIS — N179 Acute kidney failure, unspecified: Secondary | ICD-10-CM | POA: Diagnosis not present

## 2024-08-26 DIAGNOSIS — B9689 Other specified bacterial agents as the cause of diseases classified elsewhere: Secondary | ICD-10-CM | POA: Diagnosis present

## 2024-08-26 DIAGNOSIS — Z7901 Long term (current) use of anticoagulants: Secondary | ICD-10-CM | POA: Diagnosis not present

## 2024-08-26 DIAGNOSIS — R4182 Altered mental status, unspecified: Secondary | ICD-10-CM | POA: Diagnosis present

## 2024-08-26 DIAGNOSIS — E785 Hyperlipidemia, unspecified: Secondary | ICD-10-CM | POA: Diagnosis present

## 2024-08-26 DIAGNOSIS — R109 Unspecified abdominal pain: Secondary | ICD-10-CM | POA: Diagnosis not present

## 2024-08-26 DIAGNOSIS — L89620 Pressure ulcer of left heel, unstageable: Secondary | ICD-10-CM | POA: Diagnosis present

## 2024-08-26 DIAGNOSIS — G9341 Metabolic encephalopathy: Secondary | ICD-10-CM | POA: Diagnosis present

## 2024-08-26 DIAGNOSIS — Z8249 Family history of ischemic heart disease and other diseases of the circulatory system: Secondary | ICD-10-CM | POA: Diagnosis not present

## 2024-08-26 DIAGNOSIS — D696 Thrombocytopenia, unspecified: Secondary | ICD-10-CM | POA: Diagnosis present

## 2024-08-26 DIAGNOSIS — E1140 Type 2 diabetes mellitus with diabetic neuropathy, unspecified: Secondary | ICD-10-CM | POA: Diagnosis present

## 2024-08-26 DIAGNOSIS — N1832 Chronic kidney disease, stage 3b: Secondary | ICD-10-CM | POA: Diagnosis present

## 2024-08-26 DIAGNOSIS — F0390 Unspecified dementia without behavioral disturbance: Secondary | ICD-10-CM | POA: Diagnosis present

## 2024-08-26 DIAGNOSIS — Z743 Need for continuous supervision: Secondary | ICD-10-CM | POA: Diagnosis not present

## 2024-08-26 DIAGNOSIS — I5032 Chronic diastolic (congestive) heart failure: Secondary | ICD-10-CM | POA: Diagnosis present

## 2024-08-26 DIAGNOSIS — I2510 Atherosclerotic heart disease of native coronary artery without angina pectoris: Secondary | ICD-10-CM | POA: Diagnosis present

## 2024-08-26 DIAGNOSIS — N390 Urinary tract infection, site not specified: Secondary | ICD-10-CM | POA: Diagnosis present

## 2024-08-26 DIAGNOSIS — E1122 Type 2 diabetes mellitus with diabetic chronic kidney disease: Secondary | ICD-10-CM | POA: Diagnosis present

## 2024-08-26 DIAGNOSIS — I959 Hypotension, unspecified: Secondary | ICD-10-CM | POA: Diagnosis not present

## 2024-08-26 DIAGNOSIS — K5730 Diverticulosis of large intestine without perforation or abscess without bleeding: Secondary | ICD-10-CM | POA: Diagnosis not present

## 2024-08-26 DIAGNOSIS — Z833 Family history of diabetes mellitus: Secondary | ICD-10-CM | POA: Diagnosis not present

## 2024-08-26 DIAGNOSIS — D631 Anemia in chronic kidney disease: Secondary | ICD-10-CM | POA: Diagnosis present

## 2024-08-26 DIAGNOSIS — I130 Hypertensive heart and chronic kidney disease with heart failure and stage 1 through stage 4 chronic kidney disease, or unspecified chronic kidney disease: Secondary | ICD-10-CM | POA: Diagnosis present

## 2024-08-26 DIAGNOSIS — R410 Disorientation, unspecified: Secondary | ICD-10-CM | POA: Diagnosis not present

## 2024-08-26 DIAGNOSIS — Z89511 Acquired absence of right leg below knee: Secondary | ICD-10-CM | POA: Diagnosis not present

## 2024-08-26 DIAGNOSIS — Z7902 Long term (current) use of antithrombotics/antiplatelets: Secondary | ICD-10-CM | POA: Diagnosis not present

## 2024-08-26 DIAGNOSIS — Z79899 Other long term (current) drug therapy: Secondary | ICD-10-CM | POA: Diagnosis not present

## 2024-08-26 DIAGNOSIS — K449 Diaphragmatic hernia without obstruction or gangrene: Secondary | ICD-10-CM | POA: Diagnosis not present

## 2024-08-26 DIAGNOSIS — E1151 Type 2 diabetes mellitus with diabetic peripheral angiopathy without gangrene: Secondary | ICD-10-CM | POA: Diagnosis present

## 2024-08-26 LAB — BASIC METABOLIC PANEL WITH GFR
Anion gap: 9 (ref 5–15)
BUN: 43 mg/dL — ABNORMAL HIGH (ref 8–23)
CO2: 18 mmol/L — ABNORMAL LOW (ref 22–32)
Calcium: 8.2 mg/dL — ABNORMAL LOW (ref 8.9–10.3)
Chloride: 112 mmol/L — ABNORMAL HIGH (ref 98–111)
Creatinine, Ser: 2.57 mg/dL — ABNORMAL HIGH (ref 0.44–1.00)
GFR, Estimated: 17 mL/min — ABNORMAL LOW (ref >60)
Glucose, Bld: 92 mg/dL (ref 70–99)
Potassium: 4.6 mmol/L (ref 3.5–5.1)
Sodium: 139 mmol/L (ref 135–145)

## 2024-08-26 LAB — CBC WITH DIFFERENTIAL/PLATELET
Abs Immature Granulocytes: 0.04 K/uL (ref 0.00–0.07)
Basophils Absolute: 0 K/uL (ref 0.0–0.1)
Basophils Relative: 0 %
Eosinophils Absolute: 0.1 K/uL (ref 0.0–0.5)
Eosinophils Relative: 2 %
HCT: 27.4 % — ABNORMAL LOW (ref 36.0–46.0)
Hemoglobin: 8.5 g/dL — ABNORMAL LOW (ref 12.0–15.0)
Immature Granulocytes: 1 %
Lymphocytes Relative: 22 %
Lymphs Abs: 1 K/uL (ref 0.7–4.0)
MCH: 31 pg (ref 26.0–34.0)
MCHC: 31 g/dL (ref 30.0–36.0)
MCV: 100 fL (ref 80.0–100.0)
Monocytes Absolute: 0.3 K/uL (ref 0.1–1.0)
Monocytes Relative: 7 %
Neutro Abs: 3 K/uL (ref 1.7–7.7)
Neutrophils Relative %: 68 %
Platelets: 100 K/uL — ABNORMAL LOW (ref 150–400)
RBC: 2.74 MIL/uL — ABNORMAL LOW (ref 3.87–5.11)
RDW: 13.5 % (ref 11.5–15.5)
WBC: 4.4 K/uL (ref 4.0–10.5)
nRBC: 0 % (ref 0.0–0.2)

## 2024-08-26 MED ORDER — ACETAMINOPHEN 650 MG RE SUPP: 650.0000 mg | Freq: Four times a day (QID) | RECTAL | Status: DC | PRN

## 2024-08-26 MED ORDER — SODIUM CHLORIDE 0.9 % IV SOLN
1.0000 g | INTRAVENOUS | Status: DC
  Administered 2024-08-26 – 2024-08-27 (×2): 1 g via INTRAVENOUS
  Filled 2024-08-26 (×2): qty 10

## 2024-08-26 MED ORDER — FOSFOMYCIN TROMETHAMINE 3 G PO PACK: 3.0000 g | PACK | Freq: Once | ORAL | Status: DC

## 2024-08-26 MED ORDER — RIVAROXABAN 2.5 MG PO TABS
2.5000 mg | ORAL_TABLET | Freq: Two times a day (BID) | ORAL | Status: DC
  Administered 2024-08-26 – 2024-08-28 (×4): 2.5 mg via ORAL
  Filled 2024-08-26 (×4): qty 1

## 2024-08-26 MED ORDER — CLOPIDOGREL BISULFATE 75 MG PO TABS
75.0000 mg | ORAL_TABLET | Freq: Every day | ORAL | Status: DC
  Administered 2024-08-27: 75 mg via ORAL
  Filled 2024-08-26: qty 1

## 2024-08-26 MED ORDER — SODIUM CHLORIDE 0.9 % IV SOLN: 2.0000 g | INTRAVENOUS | Status: DC

## 2024-08-26 MED ORDER — FOSFOMYCIN TROMETHAMINE 3 G PO PACK
3.0000 g | PACK | Freq: Once | ORAL | Status: AC
  Administered 2024-08-26: 3 g via ORAL
  Filled 2024-08-26: qty 3

## 2024-08-26 MED ORDER — ALBUTEROL SULFATE (2.5 MG/3ML) 0.083% IN NEBU: 2.5000 mg | INHALATION_SOLUTION | RESPIRATORY_TRACT | Status: DC | PRN

## 2024-08-26 MED ORDER — ONDANSETRON HCL 4 MG/2ML IJ SOLN: 4.0000 mg | Freq: Four times a day (QID) | INTRAMUSCULAR | Status: DC | PRN

## 2024-08-26 MED ORDER — ONDANSETRON HCL 4 MG PO TABS: 4.0000 mg | ORAL_TABLET | Freq: Four times a day (QID) | ORAL | Status: DC | PRN

## 2024-08-26 MED ORDER — SODIUM CHLORIDE 0.9 % IV SOLN: INTRAVENOUS | Status: DC

## 2024-08-26 MED ORDER — HYDROCODONE-ACETAMINOPHEN 5-325 MG PO TABS
1.0000 | ORAL_TABLET | Freq: Once | ORAL | Status: AC
Start: 2024-08-26 — End: 2024-08-26
  Administered 2024-08-26: 1 via ORAL
  Filled 2024-08-26: qty 1

## 2024-08-26 MED ORDER — ACETAMINOPHEN 325 MG PO TABS
650.0000 mg | ORAL_TABLET | Freq: Four times a day (QID) | ORAL | Status: DC | PRN
  Administered 2024-08-26: 650 mg via ORAL
  Filled 2024-08-26: qty 2

## 2024-08-26 NOTE — H&P (Addendum)
 History and Physical    [name] FMW:[id number] DOB: Mar 24, 1935 DOA: 08/25/2024  PCP: Amon Aloysius BRAVO, MD   Patient coming from: Home  I have personally briefly reviewed patient's old medical records in [facility]  Chief Complaint: Altered mental status  HPI: [name] is a 89 y.o. 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  Patient is confused and reliability of history is poor.  I have reviewed patient's medical records including ED providers documentation myself.  Patient apparently presented with increasing confusion and was complaining of increasing discomfort when she urinates.  Patient has history of UTIs.  As per the son, increasing confusion is typical when she has a urinary tract infection.  No fever, vomiting reported.  Apparently patient had 2 episodes of diarrhea yesterday.  ED Course: Patient was confused.  Creatinine of 2.95 (baseline creatinine of 1.1-1.4) with bicarb of 18.  UA was suggestive of UTI.  CT of the head showed no acute intracranial abnormality.  CT of abdomen and pelvis without contrast showed no acute abnormality.  Patient was given IV fluids and 1 dose of oral fosfomycin. Hospitalist service was called to evaluate the patient.  Review of Systems: As per HPI otherwise all other systems were reviewed and are negative.   Past Medical History:  Diagnosis Date   Allergic rhinitis    Arthritis    back; ankles; hands; knees (10/31/2013)   Bilateral sensorineural hearing loss    CAD (coronary artery disease)    a. Nonobst in 2011. b. Abnormal nuc 09/2013 -> s/p cutting balloon to D2, mild LAD disease; c. 08/2015 MV: no  ischemia, EF 71%.   Carcinoma in situ in a polyp 1994   a. 1994 - malignant polyp removed during colonoscopy.   Chronic diastolic CHF (congestive heart failure) (HCC)    a. 09/2013 Echo: EF 60-65%, mild LVH, Gr1 DD.   Dementia (HCC)    Diverticulosis    DJD (degenerative joint disease)    Fatty liver    GERD (gastroesophageal reflux disease)    a. Hx GERD/esophageal dysmotility followed by Dr. Obie.    Hemorrhoids    Hiatal hernia    a. s/p Nissen fundoplication 2011.   Hyperlipidemia    a. patient unwilling to use statins.   Hypertensive heart disease    IBS (irritable bowel syndrome)    Macular degeneration 03/2009   Dr. Nicholaus   Neuropathy    a. Hands, feet, legs.   Orthostatic hypotension    Osteomyelitis (HCC)    a. 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 01/2016 s/p RLE transtibial amputation 2/2 Charcot rocker-bottom deformity and insensate neuropathy ulceration.   Osteoporosis    PE (pulmonary embolism) 12/2007   a. PE/DVT after neck surgery 2009. b. coumadin  d/c 10-2008.   PONV (postoperative nausea and vomiting) 2009   neck surgery   PPD positive    Recurrent UTI    RSD (reflex sympathetic dystrophy)    a. Chronic pain.   Spinal stenosis    Type II diabetes mellitus (HCC)    no on medication   Venous insufficiency    a. Contributing to LEE.   Wound of left foot  Past Surgical History:  Procedure Laterality Date   AMPUTATION  03/06/2012   Procedure: AMPUTATION FOOT;  Surgeon: Jerona LULLA Sage, MD;  Location: [facility] OR;  Service: Orthopedics;  Laterality: Left;  FIFTH RAY AMPUTATION    AMPUTATION Right 02/18/2016   Procedure: AMPUTATION BELOW KNEE;  Surgeon: Jerona Sage LULLA, MD;  Location: [facility] OR;  Service: Orthopedics;  Laterality: Right;   AMPUTATION Left 11/22/2016   Procedure: Amputation 4th Toe Left Foot at Metatarsophalangeal Joint;  Surgeon: Jerona Sage LULLA, MD;  Location: [facility] OR;  Service: Orthopedics;   Laterality: Left;   ANKLE FUSION  09/27/2012   Procedure: ANKLE FUSION;  Surgeon: Jerona LULLA Sage, MD;  Location: [facility] OR;  Service: Orthopedics;  Laterality: Left;  Left Tibiocalcaneal Fusion   ANKLE FUSION Right 05/09/2013   Procedure: ANKLE FUSION;  Surgeon: Jerona LULLA Sage, MD;  Location: [facility] OR;  Service: Orthopedics;  Laterality: Right;  Excision Osteomyelitis Base 1st MT Right Foot, Fusion Medial Column   ANTERIOR CERVICAL DECOMP/DISCECTOMY FUSION  12/18/07   For OA,  Dr. Barbarann:  fu by a PE   ANTERIOR CERVICAL DECOMP/DISCECTOMY FUSION N/A 11/29/2020   Procedure: Anterior Cervical Decompression Fusion - Cervical Three-Cerivcal Four;  Surgeon: Louis Shove, MD;  Location: [facility] OR;  Service: Neurosurgery;  Laterality: N/A;  3C   CARDIAC CATHETERIZATION  05/2010    at [facility] TUNNEL RELEASE Left 09/2011   CATARACT EXTRACTION W/ INTRAOCULAR LENS  IMPLANT, BILATERAL  2004   feb 2004 left, aug 2004 right   CHOLECYSTECTOMY  03/2002   CORONARY ANGIOPLASTY  10/31/2013   CORONARY ARTERY BYPASS GRAFT  09/27/2016   CYST REMOVAL HAND  06/2003   FOOT BONE EXCISION Right 06/2009   LAPAROSCOPIC RIGHT HEMI COLECTOMY N/A 01/08/2015   Procedure: LAPAROSCOPIC ASSISTED RIGHT HEMI COLECTOMY;  Surgeon: Donnice Lunger, MD;  Location: [facility] ORS;  Service: General;  Laterality: N/A;   LEFT HEART CATHETERIZATION WITH CORONARY ANGIOGRAM N/A 10/31/2013   Procedure: LEFT HEART CATHETERIZATION WITH CORONARY ANGIOGRAM;  Surgeon: Candyce GORMAN Reek, MD;  Location[facility] C[facility] LAB;  Service: Cardiovascular;  Laterality: N/A;   NASAL SEPTUM SURGERY  09/1963   NISSEN FUNDOPLICATION  01/25/2010   PERCUTANEOUS CORONARY INTERVENTION-BALLOON ONLY  10/31/2013   Procedure: PERCUTANEOUS CORONARY INTERVENTION-BALLOON ONLY;  Surgeon: Candyce GORMAN Reek, MD;  Location: [facility] CATH LAB;  Service: Cardiovascular;;   ROTATOR CUFF REPAIR Right 07/2007   Dr. Beverley FRIED CUFF REPAIR Left 11/2004   stent in left leg, behind knee  06/27/2017   x2, done  at [facility] cardiology in [facility]   TOE AMPUTATION Right 08/2008   3rd toe, Dr. Sage due to osteomyelitis   VAGINAL HYSTERECTOMY  03/1975   VEIN LIGATION Bilateral 03/1966   VENA CAVA FILTER PLACEMENT  01/2010   green filter; due to blood clots   WISDOM TOOTH EXTRACTION  06/2007     reports that she has never smoked. She has never used smokeless tobacco. She reports that she does not drink alcohol  and does not use drugs.  Allergies  Allergen Reactions   Metformin  And Related Diarrhea, Nausea Only and Other (See Comments)    dizzy, tired, chills   Cymbalta [Duloxetine Hcl] Swelling    Swelling in legs   Gabapentin Swelling    Swelling in legs   Silicone Hives, Itching, Dermatitis and Rash   Tramadol Swelling   Adhesive [Tape] Rash   Cefazolin Hives        Ciprofloxacin  Other (See Comments)  body aches    Clorazepate Dipotassium Other (See Comments)    Unknown reaction   Dilaudid  [Hydromorphone  Hcl] Itching     confused,   Doxycycline Rash   Enablex [Darifenacin Hydrobromide Er] Other (See Comments)    Hypotension, near syncope   Levofloxacin Other (See Comments)    Causes wrist pain   Lyrica [Pregabalin] Swelling    Swelling in legs   Methadone Hcl Other (See Comments)    Reaction unknown   Morphine  And Codeine Other (See Comments)    Confusion, constipation.    Talwin [Pentazocine] Other (See Comments)    climbing walls anxiety    Family History  Problem Relation Age of Onset   Heart disease Mother        mitral valve replaced   Arthritis Mother    Diabetic kidney disease Daughter    Diabetes Father    Stroke Father    Migraines Sister    Hypertension Other    Breast cancer Other    Colon cancer Neg Hx    Esophageal cancer Neg Hx    Stomach cancer Neg Hx    Rectal cancer Neg Hx     Prior to Admission medications   Medication Sig Start Date End Date Taking? Authorizing Provider  Alpha-Lipoic Acid 600 MG CAPS Take 1,200 mg by mouth daily.   Yes  [provider]  amitriptyline  (ELAVIL ) 25 MG tablet Take 1 tablet (25 mg total) by mouth at bedtime. 03/28/24  Yes Paz, Aloysius BRAVO, MD  amoxicillin  (AMOXIL ) 500 MG capsule Take 1 capsule (500 mg total) by mouth 3 (three) times daily. 08/25/24  Yes Flint Raring K, PA-C  Ascorbic Acid  (VITAMIN C WITH ROSE HIPS) 500 MG tablet Take 500 mg by mouth in the morning and at bedtime.   Yes [provider]  atorvastatin  (LIPITOR) 40 MG tablet Take 40 mg by mouth daily. 12/01/18  Yes [provider]  BERBERINE CHLORIDE PO Take by mouth. 1 every morning, 2 at bedtime. OTC   Yes [provider]  calcium  carbonate (TUMS EX) 750 MG chewable tablet Chew 4 tablets by mouth daily.   Yes [provider]  carvedilol  (COREG ) 12.5 MG tablet Take 12.5 mg by mouth 2 (two) times daily with a meal.   Yes [provider]  CINNAMON PO Take 1 tablet by mouth daily.   Yes [provider]  clopidogrel  (PLAVIX ) 75 MG tablet Take 1 tablet (75 mg total) by mouth at bedtime. Restart on 12/04/2020 11/30/20  Yes Bergman, Meghan D, NP  Cranberry 200 MG CAPS Take 2 capsules by mouth daily. *36 mg extract*   Yes [provider]  dicyclomine  (BENTYL ) 10 MG capsule TAKE 1 CAPSULE BY MOUTH THREE TIMES DAILY BEFORE MEALS 06/11/23  Yes Nandigam, Kavitha V, MD  donepezil  (ARICEPT ) 10 MG tablet Take 1 tablet (10 mg total) by mouth at bedtime. 08/29/23  Yes Amon Aloysius BRAVO, MD  MegaRed Omega-3 Krill Oil 500 MG CAPS Restart on 12/04/2020 11/30/20  Yes Bergman, Meghan D, NP  Multiple Vitamins-Minerals (PRESERVISION AREDS 2) CAPS Take 1 capsule by mouth 2 (two) times daily.   Yes [provider]  ondansetron  (ZOFRAN ) 4 MG tablet Take 1 tablet (4 mg total) by mouth every 8 (eight) hours as needed for nausea or vomiting. 04/18/23  Yes Amon Aloysius BRAVO, MD  pramipexole  (MIRAPEX ) 0.125 MG tablet Take 1-2 tablets (0.125-0.25 mg total) by mouth at bedtime as needed. 07/07/24  Yes Amon Aloysius BRAVO, MD  Probiotic Product (PROBIOTIC PEARLS PO) Take 1 tablet by mouth daily.   Yes [provider]  rivaroxaban  (XARELTO ) 2.5 MG TABS tablet Take 1 tablet (2.5 mg total) by mouth 2 (two) times daily. Restart on 12/04/2020 11/30/20  Yes Bergman, Meghan D, NP  Zinc Sulfate 66 MG TABS    Yes [provider]  diclofenac  Sodium (VOLTAREN ) 1 % GEL Apply 4 g topically 4 (four) times daily. Patient not taking: No sig reported 09/02/21   Emil Share, DO  famotidine  (PEPCID ) 20 MG tablet Take 1 tablet (20 mg total) by mouth 2 (two) times daily. Patient not taking: Reported on 08/26/2024 03/28/24   Paz, Jose E, MD  HYDROcodone -acetaminophen  (NORCO) 10-325 MG tablet Take 1 tablet by mouth every 6 (six) hours as needed. Patient not taking: Reported on 08/26/2024 05/27/24   Amon Aloysius BRAVO, MD  hydrocortisone  (ANUSOL -HC) 25 MG suppository Place 1 suppository (25 mg total) rectally 2 (two) times daily. As needed Patient not taking: Reported on 08/26/2024 08/14/23   Nandigam, Kavitha V, MD  hydrocortisone  2.5 % cream APPLY TOPICALLY TO THE AFFECTED AREA TWICE DAILY Patient not taking: Reported on 08/26/2024 01/18/23   Nandigam, Kavitha V, MD  ketoconazole  (NIZORAL ) 2 % cream Apply 1 Application topically 2 (two) times daily. Patient not taking: Reported on 08/26/2024 03/30/23   Paz, Jose E, MD  naloxone  (NARCAN ) nasal spray 4 mg/0.1 mL Place 1 spray into the nose once as needed for up to 1 dose. 10/06/20   Amon Aloysius BRAVO, MD  nitroGLYCERIN  (NITROSTAT ) 0.4 MG SL tablet Place 1 tablet (0.4 mg total) under the tongue every 5 (five) minutes x 3 doses as needed for chest pain. 12/24/19   Amon Aloysius BRAVO, MD  omeprazole  (PRILOSEC) 20 MG capsule Take 1 capsule (20 mg total) by mouth daily. As needed Patient not taking: Reported on 08/26/2024 08/14/23   Nandigam, Kavitha V, MD  silver  sulfADIAZINE  (SILVADENE ) 1 % cream Apply 1 application topically daily. Patient not taking: Reported on 08/26/2024 11/29/21   Valdemar Rocky SAUNDERS, NP     Physical Exam: Vitals:   08/26/24 1300 08/26/24 1530 08/26/24 1605 08/26/24 1622  BP: (!) 113/52 (!) 119/51 (!) 124/57   Pulse: 75 78 78   Resp: (!) 21 (!) 22 15   Temp:    97.7 F (36.5 C)  TempSrc:    Oral  SpO2: 99%  100%     Constitutional: NAD, calm, comfortable.  Elderly female lying in bed.   Vitals:   08/26/24 1300 08/26/24 1530 08/26/24 1605 08/26/24 1622  BP: (!) 113/52 (!) 119/51 (!) 124/57   Pulse: 75 78 78   Resp: (!) 21 (!) 22 15   Temp:    97.7 F (36.5 C)  TempSrc:    Oral  SpO2: 99%  100%    Eyes: PERRL, lids and conjunctivae normal ENMT: Mucous membranes are dry.  Posterior pharynx clear of any exudate or lesions. Neck: normal, supple, no masses, no thyromegaly Respiratory: bilateral decreased breath sounds at bases, no wheezing, no crackles.  Intermittently tachypneic.  No accessory muscle use.  Cardiovascular: S1 S2 positive, rate controlled. No extremity edema. 2+ pedal pulses.  Abdomen: no tenderness, no masses palpated. No hepatosplenomegaly. Bowel sounds positive.  Musculoskeletal: Right BKA present with prosthesis present skin: no ecchymosis/rashes Neurologic: Awake, pleasantly confused, slow to respond, poor historian.  No focal neurologic deficits noted  psychiatric: Flat affect.  Not agitated currently. Labs on Admission: I have personally reviewed following labs and imaging  studies  CBC: Recent Labs  Lab 08/25/24 2302 08/26/24 0958  WBC 4.7 4.4  NEUTROABS 3.1 3.0  HGB 9.8* 8.5*  HCT 32.4* 27.4*  MCV 100.9* 100.0  PLT 126* 100*   Basic Metabolic Panel: Recent Labs  Lab 08/25/24 2302 08/26/24 0958  NA 139 139  K 5.1 4.6  CL 110 112*  CO2 19* 18*  GLUCOSE 106* 92  BUN 48* 43*  CREATININE 2.95* 2.57*  CALCIUM  9.0 8.2*   GFR: CrCl cannot be calculated (Unknown ideal weight.). Liver Function Tests: Recent Labs  Lab 08/25/24 2302  AST 17  ALT 7  ALKPHOS 53  BILITOT 0.2  PROT 5.9*  ALBUMIN 3.8   No results for  input(s): LIPASE, AMYLASE in the last 168 hours. No results for input(s): AMMONIA in the last 168 hours. Coagulation Profile: No results for input(s): INR, PROTIME in the last 168 hours. Cardiac Enzymes: No results for input(s): CKTOTAL, CKMB, CKMBINDEX, TROPONINI in the last 168 hours. BNP (last 3 results) No results for input(s): PROBNP in the last 8760 hours. HbA1C: No results for input(s): HGBA1C in the last 72 hours. CBG: No results for input(s): GLUCAP in the last 168 hours. Lipid Profile: No results for input(s): CHOL, HDL, LDLCALC, TRIG, CHOLHDL, LDLDIRECT in the last 72 hours. Thyroid  Function Tests: No results for input(s): TSH, T4TOTAL, FREET4, T3FREE, THYROIDAB in the last 72 hours. Anemia Panel: No results for input(s): VITAMINB12, FOLATE, FERRITIN, TIBC, IRON, RETICCTPCT in the last 72 hours. Urine analysis:    Component Value Date/Time   COLORURINE YELLOW 08/25/2024 2136   APPEARANCEUR CLOUDY (A) 08/25/2024 2136   LABSPEC 1.013 08/25/2024 2136   PHURINE 5.5 08/25/2024 2136   GLUCOSEU NEGATIVE 08/25/2024 2136   GLUCOSEU NEGATIVE 10/19/2017 1001   HGBUR NEGATIVE 08/25/2024 2136   HGBUR negative 07/27/2008 1055   BILIRUBINUR NEGATIVE 08/25/2024 2136   BILIRUBINUR Negative 08/30/2020 0922   KETONESUR NEGATIVE 08/25/2024 2136   PROTEINUR NEGATIVE 08/25/2024 2136   UROBILINOGEN 0.2 08/30/2020 0922   UROBILINOGEN 0.2 10/19/2017 1001   NITRITE POSITIVE (A) 08/25/2024 2136   LEUKOCYTESUR LARGE (A) 08/25/2024 2136    Radiological Exams on Admission: CT ABDOMEN PELVIS WO CONTRAST Result Date: 08/26/2024 EXAM: CT ABDOMEN AND PELVIS WITHOUT CONTRAST 08/26/2024 12:09:57 AM TECHNIQUE: CT of the abdomen and pelvis was performed without the administration of intravenous contrast. Multiplanar reformatted images are provided for review. Automated exposure control, iterative reconstruction, and/or weight-based adjustment of  the mA/kV was utilized to reduce the radiation dose to as low as reasonably achievable. COMPARISON: 04/16/2024 CLINICAL HISTORY: ams, abd pain, uti, aki FINDINGS: LOWER CHEST: Coronary artery atherosclerosis. Small hiatal hernia. LIVER: The liver is unremarkable. GALLBLADDER AND BILE DUCTS: Prior cholecystectomy. No biliary ductal dilatation. SPLEEN: No acute abnormality. PANCREAS: No acute abnormality. ADRENAL GLANDS: No acute abnormality. KIDNEYS, URETERS AND BLADDER: No stones in the kidneys or ureters. No hydronephrosis. No perinephric or periureteral stranding. Urinary bladder is unremarkable. GI AND BOWEL: Stomach demonstrates no acute abnormality. Sigmoid diverticulosis. No active diverticulitis. Postoperative changes in the right colon. There is no bowel obstruction. PERITONEUM AND RETROPERITONEUM: No ascites. No free air. VASCULATURE: Aortoiliac atherosclerosis. IVC filter in the infrarenal IVC, left common iliac vein stent noted. LYMPH NODES: No lymphadenopathy. REPRODUCTIVE ORGANS: No acute abnormality. BONES AND SOFT TISSUES: Mild chronic L1 compression fracture is unchanged. Diffuse degenerative disc and facet disease. No focal soft tissue abnormality. IMPRESSION: 1. No acute findings in the abdomen or pelvis. 2. Sigmoid diverticulosis. No active diverticulitis. 3. Aortic atherosclerosis.  Coronary artery disease. 4. Small hiatal hernia. Electronically signed by: Franky Crease MD 08/26/2024 12:43 AM EST RP Workstation: [id number]   CT Head Wo Contrast Result Date: 08/26/2024 EXAM: CT HEAD WITHOUT CONTRAST 08/26/2024 12:09:57 AM TECHNIQUE: CT of the head was performed without the administration of intravenous contrast. Automated exposure control, iterative reconstruction, and/or weight based adjustment of the mA/kV was utilized to reduce the radiation dose to as low as reasonably achievable. COMPARISON: CT head 08/22/2019 CLINICAL HISTORY: Delirium FINDINGS: BRAIN AND VENTRICLES: No acute hemorrhage. No  evidence of acute infarct. No hydrocephalus. No extra-axial collection. No mass effect or midline shift. Similar moderate patchy white matter hypodensities, compatible with chronic microvascular ischemic change. Cerebral atrophy. ORBITS: No acute abnormality. SINUSES: No acute abnormality. SOFT TISSUES AND SKULL: No acute soft tissue abnormality. No skull fracture. IMPRESSION: 1. No acute intracranial abnormality. Electronically signed by: Gilmore Molt MD 08/26/2024 12:21 AM EST RP Workstation: [id number]    Assessment/Plan  UTI: Present on admission -Patient was given fosfomycin in the ED because of multiple allergies.  Follow urine culture.  Will start meropenem.  Acute metabolic encephalopathy History of dementia - Possibly from UTI.  CT of the head was negative for any intracranial acute abnormality.  Altered mental status.  Fall precautions.  If mental status does not improve with antibiotics and hydration, might need MRI of the brain.  -PT/OT/SLP eval - Hold donepezil  and amitriptyline   Acute kidney injury on CKD stage IIIb Acute metabolic acidosis -Baseline creatinine of 1.1-1.4.  Presented with creatinine of 2.95.  Possibly prerenal from dehydration.  CT abdomen did not show any urologic abnormality. - Continue IV fluids.  Repeat a.m. labs  Anemia of chronic disease - From chronic illnesses and chronic kidney disease.  Hemoglobin currently stable.  Monitor intermittently.  No signs of bleeding  Thrombocytopenia - Chronic.  Questionable cause.  Monitor intermittently.  No signs of bleeding  History of PE -Continue Xarelto   Hypertension Hyperlipidemia -Hold antihypertensives for now.  Monitor blood pressure.  Resume statin once   Diabetes mellitus type 2 - Diet controlled.  Blood sugars currently looks stable.  Will hold off on CBGs with SSI  History of CAD status post CABG and stenting Chronic diastolic heart failure - Stable.  No chest pain reported.  Strict input and  output.  Daily weights.  Hold Coreg  for now.  Goals of care - Consult palliative care for goals of care discussion.  Currently listed as full code with    DVT prophylaxis: Xarelto  Code Status: Full Family Communication: None at bedside.  Tried calling son on phone but he did not pick up  disposition Plan: Pending clinical improvement  consults called: None Admission status: Inpatient/telemetry  Severity of Illness: The appropriate patient status for this patient is INPATIENT. Inpatient status is judged to be reasonable and necessary in order to provide the required intensity of service to ensure the patient's safety. The patient's presenting symptoms, physical exam findings, and initial radiographic and laboratory data in the context of their chronic comorbidities is felt to place them at high risk for further clinical deterioration. Furthermore, it is not anticipated that the patient will be medically stable for discharge from the hospital within 2 midnights of admission.   * I certify that at the point of admission it is my clinical judgment that the patient will require inpatient hospital care spanning beyond 2 midnights from the point of admission due to high intensity of service, high risk for further deterioration and high frequency of  surveillance required.DEWAINE Sophie Mao MD [facility]  08/26/2024, 5:27 PM

## 2024-08-26 NOTE — ED Notes (Signed)
Lauren with cl called for transport 

## 2024-08-26 NOTE — ED Provider Notes (Signed)
 Patient is still awaiting a bed assignment for admission for urinary tract infection in association with increased confusion and AKI.  I do not see that she got antibiotics here in the ED.  She has multiple allergies.  On review of her last culture, it looks like her urine was E. coli with pan sensitivity.  Will give her a dose of fosfomycin.  Will recheck labs this morning.  Updated patient's family member at bedside.   Lenor Hollering, MD 08/26/24 [phone]

## 2024-08-26 NOTE — ED Provider Notes (Signed)
  Provider Note MRN:  [id number]  Arrival date & time: 08/26/24    ED Course and Medical Decision Making  Assumed care of patient at sign-out or upon transfer.  UTI with confusion and acute kidney injury awaiting CT imaging, plan is for admission.  Procedures  Final Clinical Impressions(s) / ED Diagnoses     ICD-10-CM   1. Urinary tract infection without hematuria, site unspecified  N39.0     2. Altered mental status, unspecified altered mental status type  R41.82     3. AKI (acute kidney injury)  N17.9       ED Discharge Orders          Ordered    amoxicillin  (AMOXIL ) 500 MG capsule  3 times daily        08/25/24 2244            Discharge Instructions   None     Ozell HERO. Theadore, MD [facility] Emergency Medicine [facility] [email or web address]    Theadore Ozell HERO, MD 08/26/24 [phone]

## 2024-08-26 NOTE — Progress Notes (Signed)
 Hospitalist Transfer Note:    Nursing staff, Please call [facility] Admits & Consults System-Wide number on Amion [phone]) as soon as patient's arrival, so appropriate admitting provider can evaluate the pt.   Transferring facility: [facility] Requesting provider: Dr. Theadore (EDP at [facility]) Reason for transfer: admission for further evaluation and management of suspected acute metabolic encephalopathy in the setting of acute cystitis as well as acute kidney injury.   89 year old female who presented to [facility] ED for evaluation of altered mental status, confusion over the course of the last day.  Per patient's family, patient lives at home, and at baseline, is conversant and able to significantly contribute to completion of her ADLs.  However, over the course the last day, they have noted the patient to be confused relative to her baseline mental status to the point where she is not able to significantly contribute to her ADLs.   Vital signs in the ED were notable for the following: Afebrile.  Labs were notable for urinalysis, which was reported to be consistent with UTI, with greater than 50 white blood cells, many bacteria, large leukocyte esterase, nitrite positive, and no evidence of squamous epithelial cells.  Additionally, CMP showed interval increase in creatinine to 2.95 compared to most recent prior value of 1.31 on 06/16/2024.  CBC showed white blood cell count of 4700.  Imaging notable for CT head, which was reported to show no evidence of acute intracranial process, will CT abdomen/pelvis also was reported to show no evidence of acute process.  Subsequently, I accepted this patient for transfer for inpatient admission to a med/tele bed at [facility] or [facility] (first available) for further work-up and management of the above.     Gessica Pore, DO Hospitalist

## 2024-08-27 ENCOUNTER — Ambulatory Visit: Admitting: Internal Medicine

## 2024-08-27 ENCOUNTER — Inpatient Hospital Stay

## 2024-08-27 ENCOUNTER — Inpatient Hospital Stay: Admitting: Family

## 2024-08-27 DIAGNOSIS — N179 Acute kidney failure, unspecified: Secondary | ICD-10-CM | POA: Diagnosis not present

## 2024-08-27 LAB — CBC
HCT: 28.3 % — ABNORMAL LOW (ref 36.0–46.0)
Hemoglobin: 8.2 g/dL — ABNORMAL LOW (ref 12.0–15.0)
MCH: 29.4 pg (ref 26.0–34.0)
MCHC: 29 g/dL — ABNORMAL LOW (ref 30.0–36.0)
MCV: 101.4 fL — ABNORMAL HIGH (ref 80.0–100.0)
Platelets: 110 K/uL — ABNORMAL LOW (ref 150–400)
RBC: 2.79 MIL/uL — ABNORMAL LOW (ref 3.87–5.11)
RDW: 13.6 % (ref 11.5–15.5)
WBC: 3.7 K/uL — ABNORMAL LOW (ref 4.0–10.5)
nRBC: 0 % (ref 0.0–0.2)

## 2024-08-27 LAB — COMPREHENSIVE METABOLIC PANEL WITH GFR
ALT: 8 U/L (ref 0–44)
AST: 19 U/L (ref 15–41)
Albumin: 3.2 g/dL — ABNORMAL LOW (ref 3.5–5.0)
Alkaline Phosphatase: 43 U/L (ref 38–126)
Anion gap: 7 (ref 5–15)
BUN: 42 mg/dL — ABNORMAL HIGH (ref 8–23)
CO2: 18 mmol/L — ABNORMAL LOW (ref 22–32)
Calcium: 8.2 mg/dL — ABNORMAL LOW (ref 8.9–10.3)
Chloride: 116 mmol/L — ABNORMAL HIGH (ref 98–111)
Creatinine, Ser: 2.6 mg/dL — ABNORMAL HIGH (ref 0.44–1.00)
GFR, Estimated: 17 mL/min — ABNORMAL LOW (ref >60)
Glucose, Bld: 84 mg/dL (ref 70–99)
Potassium: 4.6 mmol/L (ref 3.5–5.1)
Sodium: 140 mmol/L (ref 135–145)
Total Bilirubin: 0.2 mg/dL (ref 0.0–1.2)
Total Protein: 5.2 g/dL — ABNORMAL LOW (ref 6.5–8.1)

## 2024-08-27 LAB — VITAMIN B12: Vitamin B-12: 657 pg/mL (ref 180–914)

## 2024-08-27 LAB — MAGNESIUM: Magnesium: 2.1 mg/dL (ref 1.7–2.4)

## 2024-08-27 LAB — FOLATE: Folate: >20 ng/mL (ref >5.9)

## 2024-08-27 LAB — AMMONIA: Ammonia: 28 umol/L (ref 9–35)

## 2024-08-27 LAB — TSH: TSH: 1.2 u[IU]/mL (ref 0.350–4.500)

## 2024-08-27 MED ORDER — HYDROCODONE-ACETAMINOPHEN 10-325 MG PO TABS
1.0000 | ORAL_TABLET | Freq: Four times a day (QID) | ORAL | Status: DC | PRN
  Administered 2024-08-27 – 2024-08-28 (×3): 1 via ORAL
  Filled 2024-08-27 (×3): qty 1

## 2024-08-27 MED ORDER — CARVEDILOL 12.5 MG PO TABS
12.5000 mg | ORAL_TABLET | Freq: Two times a day (BID) | ORAL | Status: DC
  Administered 2024-08-27 – 2024-08-28 (×3): 12.5 mg via ORAL
  Filled 2024-08-27 (×3): qty 1

## 2024-08-27 MED ORDER — SODIUM CHLORIDE 0.9 % IV SOLN: INTRAVENOUS | Status: DC

## 2024-08-27 MED ORDER — HYDROXYZINE HCL 10 MG PO TABS: 10.0000 mg | ORAL_TABLET | Freq: Once | ORAL | Status: DC | PRN

## 2024-08-27 MED ORDER — MELATONIN 5 MG PO TABS
5.0000 mg | ORAL_TABLET | Freq: Once | ORAL | Status: AC
  Administered 2024-08-27: 5 mg via ORAL
  Filled 2024-08-27: qty 1

## 2024-08-27 MED ORDER — DONEPEZIL HCL 10 MG PO TABS
10.0000 mg | ORAL_TABLET | Freq: Every day | ORAL | Status: DC
  Administered 2024-08-27: 10 mg via ORAL
  Filled 2024-08-27: qty 1

## 2024-08-27 MED ORDER — ATORVASTATIN CALCIUM 40 MG PO TABS
40.0000 mg | ORAL_TABLET | Freq: Every day | ORAL | Status: DC
  Administered 2024-08-27 – 2024-08-28 (×2): 40 mg via ORAL
  Filled 2024-08-27 (×2): qty 1

## 2024-08-27 NOTE — Evaluation (Signed)
 Physical Therapy Evaluation Patient Details Name: [name] MRN: [id number] DOB: 14-Mar-1935 Today's Date: 08/27/2024  History of Present Illness  89 yo female presented 08/25/24 from home with AMS, UTI symptoms. PMH: ACDF, OA, CAD s/p CABG, CHF, foot osteomyelitis, L toe amputations, R BKA, IBS, dementia, HLD, macular degeneration, orthostatic hypotension, DMII, UTIs  Clinical Impression  Patient's grandson present to provide information. Patient is alert and able to follow simple directions  and participate in mobility. Patient  resides with family with 24/7 caregivers, transfers with assistance and mobilizes in a [facility]  and uses a  R Prosthesis. Patient should  be able to return home with family assistance.  Pt admitted with above diagnosis.  Pt currently with functional limitations due to the deficits listed below (see PT Problem List). Pt will benefit from acute skilled PT to increase their independence and safety with mobility to allow discharge.         If plan is discharge home, recommend the following: A lot of help with walking and/or transfers;A little help with bathing/dressing/bathroom;Help with stairs or ramp for entrance   Can travel by private vehicle        Equipment Recommendations None recommended by PT  Recommendations for Other Services       Functional Status Assessment Patient has not had a recent decline in their functional status     Precautions / Restrictions Precautions Precautions: Fall Recall of Precautions/Restrictions: Impaired Precaution/Restrictions Comments: R BKA with prosthesis Required Braces or Orthoses: Other Brace Other Brace: R  prosthetic, use shoes Restrictions Weight Bearing Restrictions Per Provider Order: No      Mobility  Bed Mobility Overal bed mobility: Needs Assistance Bed Mobility: Supine to Sit     Supine to sit: Contact guard     General bed mobility comments: patient followed directions to move to sitting, abl e to  use arms to scoot to bed edge  and place R prosthesis with min assist    Transfers Overall transfer level: Needs assistance Equipment used: None Transfers: Sit to/from Stand, Bed to chair/wheelchair/BSC Sit to Stand: Mod assist Stand pivot transfers: Mod assist   Squat pivot transfers: Mod assist     General transfer comment: multimodal cues for safe hand placement. pt lacks safety awareness, will need shoes for next transfer for safety as prosthesis slides in grip socks    Ambulation/Gait               General Gait Details: nonambulatory  Stairs            Wheelchair Mobility     Tilt Bed    Modified Rankin (Stroke Patients Only)       Balance Overall balance assessment: Needs assistance Sitting-balance support: Feet supported, No upper extremity supported Sitting balance-Leahy Scale: Fair     Standing balance support: During functional activity, Bilateral upper extremity supported Standing balance-Leahy Scale: Poor                               Pertinent Vitals/Pain Pain Assessment Pain Assessment: No/denies pain    Home Living Family/patient expects to be discharged to:: Private residence Living Arrangements: Children Available Help at Discharge: Family;Available 24 hours/day Type of Home: House Home Access: Stairs to enter Entrance Stairs-Rails: None Entrance Stairs-Number of Steps: 4   Home Layout: One level Home Equipment: Agricultural [employer]ant (2 wheels);Wheelchair - manual;Shower seat;BSC/3in1 Additional Comments: lives in [place] or [place] with family providing 24/7  support    Prior Function Prior Level of Function : Needs assist  Cognitive Assist : Mobility (cognitive);ADLs (cognitive) Mobility (Cognitive): Step by step cues ADLs (Cognitive): Step by step cues Physical Assist : Mobility (physical);ADLs (physical) Mobility (physical): Bed mobility;Transfers;Stairs ADLs (physical): Grooming;Bathing;Dressing;Toileting;IADLs Mobility  Comments: squat/stand pivot transfers from bed <> w/c <> BSC. self propels WC using feet, left more so, reports a sore on the heel, not ambulatory at baseline ADLs Comments: family assists with transfers t/f [facility], assist with all dressing/sponge bathing, pt can feed self and perform grooming tasks. hx of dementia.     Extremity/Trunk Assessment   Upper Extremity Assessment Upper Extremity Assessment: Generalized weakness    Lower Extremity Assessment RLE Deficits / Details: BKA LLE Deficits / Details: reported a sore on heel from pushing WC with  feet    Cervical / Trunk Assessment Cervical / Trunk Assessment: Normal  Communication   Communication Communication: Impaired Factors Affecting Communication: Hearing impaired    Cognition Arousal: Alert Behavior During Therapy: WFL for tasks assessed/performed   PT - Cognitive impairments: History of cognitive impairments                       PT - Cognition Comments: frequent cues  for safety.  did follow directions to move to sitting onto bed  edge, able to place leg into prostethis and lock shuttle Following commands: Impaired Following commands impaired: Follows one step commands with increased time, Follows one step commands inconsistently     Cueing Cueing Techniques: Verbal cues, Gestural cues, Tactile cues     General Comments      Exercises     Assessment/Plan    PT Assessment Patient needs continued PT services  PT Problem List Decreased strength;Decreased cognition;Decreased activity tolerance;Decreased mobility;Decreased skin integrity       PT Treatment Interventions DME instruction;Functional mobility training;Therapeutic activities;Wheelchair mobility training;Patient/family education    PT Goals (Current goals can be found in the Care Plan section)  Acute Rehab PT Goals Patient Stated Goal: return home PT Goal Formulation: With patient/family Time For Goal Achievement: 09/10/24 Potential to  Achieve Goals: Good    Frequency Min 3X/week     Co-evaluation PT/OT/SLP Co-Evaluation/Treatment: Yes   PT goals addressed during session: Mobility/safety with mobility OT goals addressed during session: ADL's and self-care       AM-PAC PT 6 Clicks Mobility  Outcome Measure Help needed turning from your back to your side while in a flat bed without using bedrails?: A Little Help needed moving from lying on your back to sitting on the side of a flat bed without using bedrails?: A Little Help needed moving to and from a bed to a chair (including a wheelchair)?: A Lot Help needed standing up from a chair using your arms (e.g., wheelchair or bedside chair)?: A Lot Help needed to walk in hospital room?: Total Help needed climbing 3-5 steps with a railing? : Total 6 Click Score: 12    End of Session Equipment Utilized During Treatment: Gait belt Activity Tolerance: Patient tolerated treatment well Patient left: in chair;with call bell/phone within reach;with chair alarm set;with family/visitor present Nurse Communication: Mobility status PT Visit Diagnosis: Unsteadiness on feet (R26.81);Muscle weakness (generalized) (M62.81)    Time: 9174-9158 PT Time Calculation (min) (ACUTE ONLY): 16 min   Charges:   PT Evaluation $PT Eval Low Complexity: 1 Low   PT General Charges $$ ACUTE PT VISIT: 1 Visit  Darice Potters PT Acute Rehabilitation Services Office [phone]   Potters Darice Norris 08/27/2024, 1:39 PM

## 2024-08-27 NOTE — Plan of Care (Signed)
   Problem: Activity: Goal: Risk for activity intolerance will decrease Outcome: Progressing   Problem: Coping: Goal: Level of anxiety will decrease Outcome: Progressing   Problem: Pain Managment: Goal: General experience of comfort will improve and/or be controlled Outcome: Progressing   Problem: Skin Integrity: Goal: Risk for impaired skin integrity will decrease Outcome: Progressing

## 2024-08-27 NOTE — Evaluation (Signed)
 Clinical/Bedside Swallow Evaluation Patient Details  Name: [name] MRN: [id number] Date of Birth: 1935-09-12  Today's Date: 08/27/2024 Time: SLP Start Time (ACUTE ONLY): 1135 SLP Stop Time (ACUTE ONLY): 1155 SLP Time Calculation (min) (ACUTE ONLY): 20 min  Past Medical History:  Past Medical History:  Diagnosis Date   Allergic rhinitis    Arthritis    back; ankles; hands; knees (10/31/2013)   Bilateral sensorineural hearing loss    CAD (coronary artery disease)    a. Nonobst in 2011. b. Abnormal nuc 09/2013 -> s/p cutting balloon to D2, mild LAD disease; c. 08/2015 MV: no ischemia, EF 71%.   Carcinoma in situ in a polyp 1994   a. 1994 - malignant polyp removed during colonoscopy.   Chronic diastolic CHF (congestive heart failure) (HCC)    a. 09/2013 Echo: EF 60-65%, mild LVH, Gr1 DD.   Dementia (HCC)    Diverticulosis    DJD (degenerative joint disease)    Fatty liver    GERD (gastroesophageal reflux disease)    a. Hx GERD/esophageal dysmotility followed by Dr. Obie.    Hemorrhoids    Hiatal hernia    a. s/p Nissen fundoplication 2011.   Hyperlipidemia    a. patient unwilling to use statins.   Hypertensive heart disease    IBS (irritable bowel syndrome)    Macular degeneration 03/2009   Dr. Nicholaus   Neuropathy    a. Hands, feet, legs.   Orthostatic hypotension    Osteomyelitis (HCC)    a. 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 01/2016 s/p RLE transtibial amputation 2/2 Charcot rocker-bottom deformity and insensate neuropathy ulceration.   Osteoporosis    PE (pulmonary embolism) 12/2007   a. PE/DVT after neck surgery 2009. b. coumadin  d/c 10-2008.   PONV (postoperative nausea and vomiting) 2009   neck surgery   PPD positive    Recurrent UTI    RSD (reflex sympathetic dystrophy)    a. Chronic pain.   Spinal stenosis    Type II diabetes mellitus (HCC)    no on medication   Venous insufficiency    a.  Contributing to LEE.   Wound of left foot    Past Surgical History:  Past Surgical History:  Procedure Laterality Date   AMPUTATION  03/06/2012   Procedure: AMPUTATION FOOT;  Surgeon: Jerona LULLA Sage, MD;  Location: [facility] OR;  Service: Orthopedics;  Laterality: Left;  FIFTH RAY AMPUTATION    AMPUTATION Right 02/18/2016   Procedure: AMPUTATION BELOW KNEE;  Surgeon: Jerona Sage LULLA, MD;  Location: [facility] OR;  Service: Orthopedics;  Laterality: Right;   AMPUTATION Left 11/22/2016   Procedure: Amputation 4th Toe Left Foot at Metatarsophalangeal Joint;  Surgeon: Jerona Sage LULLA, MD;  Location: [facility] OR;  Service: Orthopedics;  Laterality: Left;   ANKLE FUSION  09/27/2012   Procedure: ANKLE FUSION;  Surgeon: Jerona LULLA Sage, MD;  Location: [facility] OR;  Service: Orthopedics;  Laterality: Left;  Left Tibiocalcaneal Fusion   ANKLE FUSION Right 05/09/2013   Procedure: ANKLE FUSION;  Surgeon: Jerona LULLA Sage, MD;  Location: [facility] OR;  Service: Orthopedics;  Laterality: Right;  Excision Osteomyelitis Base 1st MT Right Foot, Fusion Medial Column   ANTERIOR CERVICAL DECOMP/DISCECTOMY FUSION  12/18/07   For OA,  Dr. Barbarann:  fu by a PE   ANTERIOR CERVICAL DECOMP/DISCECTOMY FUSION N/A 11/29/2020   Procedure: Anterior Cervical Decompression Fusion - Cervical Three-Cerivcal Four;  Surgeon: Louis Shove, MD;  Location: [facility]  OR;  Service: Neurosurgery;  Laterality: N/A;  3C   CARDIAC CATHETERIZATION  05/2010    at [facility] TUNNEL RELEASE Left 09/2011   CATARACT EXTRACTION W/ INTRAOCULAR LENS  IMPLANT, BILATERAL  2004   feb 2004 left, aug 2004 right   CHOLECYSTECTOMY  03/2002   CORONARY ANGIOPLASTY  10/31/2013   CORONARY ARTERY BYPASS GRAFT  09/27/2016   CYST REMOVAL HAND  06/2003   FOOT BONE EXCISION Right 06/2009   LAPAROSCOPIC RIGHT HEMI COLECTOMY N/A 01/08/2015   Procedure: LAPAROSCOPIC ASSISTED RIGHT HEMI COLECTOMY;  Surgeon: Donnice Lunger, MD;  Location: [facility] ORS;  Service: General;  Laterality: N/A;   LEFT HEART CATHETERIZATION WITH  CORONARY ANGIOGRAM N/A 10/31/2013   Procedure: LEFT HEART CATHETERIZATION WITH CORONARY ANGIOGRAM;  Surgeon: Candyce GORMAN Reek, MD;  Location: [facility] CATH LAB;  Service: Cardiovascular;  Laterality: N/A;   NASAL SEPTUM SURGERY  09/1963   NISSEN FUNDOPLICATION  01/25/2010   PERCUTANEOUS CORONARY INTERVENTION-BALLOON ONLY  10/31/2013   Procedure: PERCUTANEOUS CORONARY INTERVENTION-BALLOON ONLY;  Surgeon: Candyce GORMAN Reek, MD;  Location: [facility] CATH LAB;  Service: Cardiovascular;;   ROTATOR CUFF REPAIR Right 07/2007   Dr. Beverley FRIED CUFF REPAIR Left 11/2004   stent in left leg, behind knee  06/27/2017   x2, done at [facility] cardiology in [facility]   TOE AMPUTATION Right 08/2008   3rd toe, Dr. Harden due to osteomyelitis   VAGINAL HYSTERECTOMY  03/1975   VEIN LIGATION Bilateral 03/1966   VENA CAVA FILTER PLACEMENT  01/2010   green filter; due to blood clots   WISDOM TOOTH EXTRACTION  06/2007   HPI:  89 yo female presented 08/25/24 from home with AMS, UTI symptoms. PMH: ACDF, OA, CAD s/p CABG, CHF, foot osteomyelitis, L toe amputations, R BKA, IBS, dementia, HLD, macular degeneration, orthostatic hypotension, DMII, UTIs, GERD, HH s/p Nissan, neuropathy, DM2, PPD positive.  Abdomen CT showed Small hiatal hernia, No acute findings in the abdomen or pelvis.  2. Sigmoid diverticulosis. No active diverticulitis.  3. Aortic atherosclerosis. Coronary artery disease.    Prior CT head showed nothing acute.  Prior neck imaging showed anterior body fusion at C4-5 and C5-6 with prominent disc osteophyte protrusion at C3-4 resulting in mild spinal canal and moderate bilateral foraminal narrowing.  C6-7 also shows m mild bilateral foraminal narrowing.  Two prior ACDFs with most recent in 2022. Swallow evaluation ordered presumed due to AMS.  Pt denies any dysphagia PTA, even acutely after her ACDF surgeries.    Daughter reports pt eats slowly and feeds herself at home.  She will supplements mom's intake with protein  shakes mixed with ice cream as needed.  Pt's intake is suboptimal, thus they do not follow a carbohydrate modified diet.    Assessment / Plan / Recommendation  Clinical Impression  Patient demonstrates functional oropharyngeal swallow ability - no S/S of aspiration or dysphagia across all PO intake. She eats slowly and masticates thoroughly with full oral clearance.  Daughter reports pt's intake has been subpar and has had gustatory changes since hospital admit.   No focal CN deficits noted and pt able to feed herself.  Did not conduct 3 ounce Yale water challenge due to pt having a Nissen and GERD.  Recommend regular/thin diet wiht general precautions.  No SLP follow up indicated. Thanks for this consult. SLP Visit Diagnosis: Dysphagia, unspecified (R13.10)    Aspiration Risk  Mild aspiration risk    Diet Recommendation Regular;Thin liquid  Liquid Administration via: Cup;Straw Medication Administration: Other (Comment) (as tolerated) Supervision: Full supervision/cueing for compensatory strategies Compensations: Slow rate;Small sips/bites Postural Changes: Seated upright at 90 degrees;Remain upright for at least 30 minutes after po intake    Other  Recommendations Oral Care Recommendations: Oral care BID     Assistance Recommended at Discharge  N/a  Functional Status Assessment Patient has not had a recent decline in their functional status  Frequency and Duration     N/a       Prognosis   N/a     Swallow Study   General Date of Onset: 08/27/24 HPI: 89 yo female presented 08/25/24 from home with AMS, UTI symptoms. PMH: ACDF, OA, CAD s/p CABG, CHF, foot osteomyelitis, L toe amputations, R BKA, IBS, dementia, HLD, macular degeneration, orthostatic hypotension, DMII, UTIs, GERD, HH s/p Nissan, neuropathy, DM2, PPD positive.  Abdomen CT showed Small hiatal hernia, No acute findings in the abdomen or pelvis.  2. Sigmoid diverticulosis. No active diverticulitis.  3. Aortic  atherosclerosis. Coronary artery disease.    Prior CT head showed nothing acute.  Prior neck imaging showed anterior body fusion at C4-5 and C5-6 with prominent disc osteophyte protrusion at C3-4 resulting in mild spinal canal and moderate bilateral foraminal narrowing.  C6-7 also shows m mild bilateral foraminal narrowing.  Two prior ACDFs with most recent in 2022. Swallow evaluation ordered presumed due to AMS.  Pt denies any dysphagia PTA, even acutely after her ACDF surgeries.    Daughter reports pt eats slowly and feeds herself at home.  She will supplements mom's intake with protein shakes mixed with ice cream as needed.  Pt's intake is suboptimal, thus they do not follow a carbohydrate modified diet. Type of Study: Bedside Swallow Evaluation Diet Prior to this Study: Regular;Thin liquids (Level 0) Temperature Spikes Noted: No Respiratory Status: Room air History of Recent Intubation: No Behavior/Cognition: Alert;Cooperative;Pleasant mood Oral Cavity Assessment: Within Functional Limits Oral Care Completed by SLP: No Oral Cavity - Dentition: Adequate natural dentition Vision: Functional for self-feeding Self-Feeding Abilities: Able to feed self Patient Positioning: Upright in bed Baseline Vocal Quality: Normal Volitional Cough: Strong Volitional Swallow: Able to elicit    Oral/Motor/Sensory Function Overall Oral Motor/Sensory Function: Within functional limits   Ice Chips Ice chips: Not tested   Thin Liquid Thin Liquid: Within functional limits Presentation: Self Fed;Cup    Nectar Thick Nectar Thick Liquid: Not tested   Honey Thick Honey Thick Liquid: Not tested   Puree Puree: Within functional limits Presentation: Self Fed;Spoon   Solid     Solid: Within functional limits Presentation: Self Fed      Nicolas Emmie Caldron 08/27/2024,12:16 PM   Madelin POUR, MS [facility] SLP Acute Rehab Services Office [phone]

## 2024-08-27 NOTE — Evaluation (Signed)
 Occupational Therapy Evaluation Patient Details Name: [name] MRN: [id number] DOB: [birth year] Today's Date: 08/27/2024   History of Present Illness   89 yo female presented 08/25/24 from home with AMS, UTI symptoms. PMH: ACDF, OA, CAD s/p CABG, CHF, foot osteomyelitis, L toe amputations, R BKA, IBS, dementia, HLD, macular degeneration, orthostatic hypotension, DMII, UTIs     Clinical Impressions Pt admitted with above. Pt poor historian, alert only to name with grandson assisting with PLOF information. Pt has 24/7 supervision and lives with family in both [place] who provide care. Pt requires assist for ADLs, sponge bathes, and +1 for stand / squat pivot transfers t/f wheelchair/recliner/BSC with R prosthesis. Pt self-propels wheelchair around house with R foot. Pt performs bed mobility with CGA, MAX A to don R prosthesis while sitting EOB, and transfers with SPT MOD A +1 bed > drop arm recliner. Pt lacks insight and judgement, attempting to stand and requires multimodal cuing with +2 present for safety. Pt washes face with setup in recliner end of session. Pt would benefit from skilled OT services to address noted impairments and functional limitations (see below for any additional details) in order to maximize safety and independence while minimizing falls risk and caregiver burden. Anticipate the need for follow up [facility] OT services upon acute hospital DC.      If plan is discharge home, recommend the following:   A lot of help with walking and/or transfers;A lot of help with bathing/dressing/bathroom;Assistance with cooking/housework;Direct supervision/assist for medications management;Direct supervision/assist for financial management;Assist for transportation;Help with stairs or ramp for entrance;Supervision due to cognitive status     Functional Status Assessment   Patient has had a recent decline in their functional status and demonstrates the ability to make significant improvements  in function in a reasonable and predictable amount of time.     Equipment Recommendations   None recommended by OT      Precautions/Restrictions   Precautions Precautions: Fall Recall of Precautions/Restrictions: Impaired Precaution/Restrictions Comments: R BKA with prosthesis Required Braces or Orthoses: Other Brace Other Brace: R  prosthetic, use shoes Restrictions Weight Bearing Restrictions Per Provider Order: No     Mobility Bed Mobility Overal bed mobility: Needs Assistance Bed Mobility: Supine to Sit     Supine to sit: Contact guard          Transfers Overall transfer level: Needs assistance Equipment used: None Transfers: Sit to/from Stand, Bed to chair/wheelchair/BSC Sit to Stand: Mod assist Stand pivot transfers: Mod assist Squat pivot transfers: Mod assist       General transfer comment: multimodal cues for safe hand placement. pt lacks safety awareness, will need shoes for next transfer for safety as prosthesis slides in grip socks      Balance Overall balance assessment: Needs assistance Sitting-balance support: Feet supported, No upper extremity supported Sitting balance-Leahy Scale: Fair     Standing balance support: During functional activity, Bilateral upper extremity supported Standing balance-Leahy Scale: Poor                             ADL either performed or assessed with clinical judgement   ADL Overall ADL's : Needs assistance/impaired Eating/Feeding: Sitting;Set up   Grooming: Sitting;Set up Grooming Details (indicate cue type and reason): recliner level Upper Body Bathing: Sitting;Minimal assistance   Lower Body Bathing: Sitting/lateral leans;Maximal assistance;Sit to/from stand   Upper Body Dressing : Sitting;Minimal assistance   Lower Body Dressing: Sit to/from stand;Sitting/lateral leans;Maximal assistance  Lower Body Dressing Details (indicate cue type and reason): dons R prosthesis sitting EOB Toilet  Transfer: Moderate assistance;BSC/3in1;Requires drop arm;Squat-pivot Toilet Transfer Details (indicate cue type and reason): SPT with arm dropped bed > recliner. R foot blocked with prosthesis sliding. Pt with poor safety awareness. Is able to rise to partial stand and power through transfer reaching for recliner arm rests Toileting- Clothing Manipulation and Hygiene: Maximal assistance;Sit to/from stand       Functional mobility during ADLs: Moderate assistance;+2 for safety/equipment;Cueing for safety;Cueing for sequencing General ADL Comments: pt requires assist at baseline for ADLs, likely near or at baseline     Vision Baseline Vision/History: 1 Wears glasses;6 Macular Degeneration Ability to See in Adequate Light: 2 Moderately impaired Patient Visual Report: No change from baseline              Pertinent Vitals/Pain Pain Assessment Pain Assessment: No/denies pain     Extremity/Trunk Assessment Upper Extremity Assessment Upper Extremity Assessment: Generalized weakness;Right hand dominant   Lower Extremity Assessment Lower Extremity Assessment: Defer to PT evaluation RLE Deficits / Details: BKA LLE Deficits / Details: reported a sore on heel from pushing WC with  feet   Cervical / Trunk Assessment Cervical / Trunk Assessment: Normal   Communication Communication Communication: Impaired Factors Affecting Communication: Hearing impaired   Cognition Arousal: Alert Behavior During Therapy: WFL for tasks assessed/performed Cognition: History of cognitive impairments             OT - Cognition Comments: hx of dementia. alert only to name. does not know birthday, location or date. grandson reports confusion improving since UTI tx. impaired safety and judgement.                 Following commands: Impaired Following commands impaired: Follows one step commands with increased time, Follows one step commands inconsistently     Cueing  General Comments    Cueing Techniques: Verbal cues;Gestural cues;Tactile cues              Home Living Family/patient expects to be discharged to:: Private residence Living Arrangements: Children Available Help at Discharge: Family;Available 24 hours/day Type of Home: House Home Access: Stairs to enter (grandson pulls pt up/down stairs in w/c) Entrance Stairs-Number of Steps: 4 Entrance Stairs-Rails: None Home Layout: One level     Bathroom Shower/Tub: Producer, Television/film/video: Standard     Home Equipment: Agricultural Consultant (2 wheels);Wheelchair - Engineer, Production Comments: lives in [place] or [place] with family providing 24/7 support      Prior Functioning/Environment Prior Level of Function : Needs assist  Cognitive Assist : Mobility (cognitive);ADLs (cognitive) Mobility (Cognitive): Step by step cues ADLs (Cognitive): Step by step cues Physical Assist : Mobility (physical);ADLs (physical) Mobility (physical): Bed mobility;Transfers;Stairs ADLs (physical): Grooming;Bathing;Dressing;Toileting;IADLs Mobility Comments: squat/stand pivot transfers from bed <> w/c <> BSC. self propels WC using feet, left more so, reports a sore on the heel, not ambulatory at baseline ADLs Comments: family assists with transfers t/f [facility], assist with all dressing/sponge bathing, pt can feed self and perform grooming tasks. hx of dementia.    OT Problem List: Decreased strength;Decreased activity tolerance;Impaired balance (sitting and/or standing);Decreased cognition;Decreased safety awareness;Decreased knowledge of use of DME or AE;Decreased knowledge of precautions   OT Treatment/Interventions: Self-care/ADL training;Energy conservation;DME and/or AE instruction;Therapeutic activities;Patient/family education;Cognitive remediation/compensation;Balance training      OT Goals(Current goals can be found in the care plan section)   Acute Rehab OT Goals OT Goal Formulation: With  family Time  For Goal Achievement: 09/10/24 Potential to Achieve Goals: Fair   OT Frequency:  Min 2X/week    Co-evaluation PT/OT/SLP Co-Evaluation/Treatment: Yes Reason for Co-Treatment: Necessary to address cognition/behavior during functional activity;For patient/therapist safety PT goals addressed during session: Mobility/safety with mobility OT goals addressed during session: ADL's and self-care      AM-PAC OT 6 Clicks Daily Activity     Outcome Measure Help from another person eating meals?: A Little Help from another person taking care of personal grooming?: A Little Help from another person toileting, which includes using toliet, bedpan, or urinal?: A Lot Help from another person bathing (including washing, rinsing, drying)?: A Lot Help from another person to put on and taking off regular upper body clothing?: A Little Help from another person to put on and taking off regular lower body clothing?: A Little 6 Click Score: 16   End of Session Equipment Utilized During Treatment: Gait belt Nurse Communication: Mobility status (use of STEDY)  Activity Tolerance: Patient tolerated treatment well Patient left: in chair;with call bell/phone within reach;with chair alarm set;with family/visitor present  OT Visit Diagnosis: Unsteadiness on feet (R26.81);Other abnormalities of gait and mobility (R26.89);Muscle weakness (generalized) (M62.81);Other symptoms and signs involving cognitive function                Time: 9175-9159 OT Time Calculation (min): 16 min Charges:  OT General Charges $OT Visit: 1 Visit OT Evaluation $OT Eval Low Complexity: 1 Low  Chanda Laperle L. Kendrick Haapala, OTR/L  08/27/24, 9:52 AM

## 2024-08-27 NOTE — Progress Notes (Addendum)
 PROGRESS NOTE    [name]  FMW:[id number] DOB: 04-24-1935 DOA: 08/25/2024 PCP: Amon Aloysius BRAVO, MD    Brief Narrative:   [name] is a 89 y.o. 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  Family concerns this is a recurrent UTI given similar symptoms in the past.  No fever/vomiting.  In the ED, temperature 97.8 F, HR 92, RR 16, BP 115/74, SpO2 100% on room air.  WBC 4.7, hemoglobin 9.8, platelet count 126.  Sodium 139, potassium 5.1, chloride 110, CO2 19, glucose 106, BUN 48, creatinine 2.95.  AST 17, ALT 7, total bilirubin 0.2.  Ammonia 28.  Urinalysis with large leukocytes, positive nitrite, many bacteria, greater than 50 WBCs.  CT head without contrast with no acute intracranial abnormality.  CT abdomen/pelvis with no acute findings in abdomen/pelvis, sigmoid diverticulosis without active diverticulitis, aortic atherosclerosis, coronary artery disease, small hiatal hernia.  Patient was given IV fluids, 1 dose of oral fosfomycin.  TRH consulted for admission and patient was transferred to [facility] for further evaluation and management of UTI.  Assessment & Plan:   Acute metabolic encephalopathy, POA Klebsiella oxytoca UTI Patient presenting with confusion, painful urination.  Similar symptoms in the past 2 recurrent UTI.  Review of EMR notable for previous E. coli, Klebsiella, Enterococcus UTI.  Urinalysis consistent with UTI.  Ammonia level within normal limits.  CT head without contrast unrevealing.  Received dose of fosfomycin in the ED.  -- Urine Culture: + >100K Klebsiella oxytoca; susceptibilities pending -- Ceftriaxone 1 g IV every 24  hours  Acute renal failure on CKD stage IIIa Baseline creatinine 1.1-1.4.  Presenting with elevated creatinine of 2.95.  No hydronephrosis on CT abdomen/pelvis. Etiology likely secondary to prerenal azotemia in setting of poor oral intake in the days preceding hospitalization. -- Cr 2.95>2.57>2.60 -- NS at 100 mL/h -- BMP in am  HTN -- Carvedilol  12.5 mg p.o. twice daily  DM2 Diet controlled at baseline.  HLD -- Atorvastatin  40 m p.o. daily  Hx PE -- Xarelto  2.5 mg p.o. twice daily  CAD s/p CABG/stent -- Plavix  75 mg p.o. daily -- Atorvastatin  40 mg p.o. daily  Dementia -- Aricept  10 mg p.o. nightly -- Delirium precautions -- Get up during the day --Encourage a familiar face to remain present throughout the day -- Keep blinds open and lights on during daylight hours -- Minimize the use of opioids/benzodiazepines   DVT prophylaxis:  rivaroxaban  (XARELTO ) tablet 2.5 mg    Code Status: Full Code Family Communication: No family present at bedside  Disposition Plan:  Level of care: Med-Surg Status is: Inpatient Remains inpatient appropriate because: IV antibiotics    Consultants:  None  Procedures:  None  Antimicrobials:  Ceftriaxone   Subjective: Patient seen examined bedside, lying in bed.  Pleasantly confused.  Nephew present at bedside.  Remains on IV antibiotics, IV fluids.  Awaiting urine culture susceptibilities.  Patient reports want to go home as soon as possible.  Discussed need further culture data in order to de-escalate to oral antibiotics given multiple resistances in the past per EMR review.  No other questions or concerns at this time.  Denies headache, no dizziness, no chest pain, no shortness  of breath, no abdominal pain, no fever/chills.  No acute events overnight per nursing staff.  Objective: Vitals:   08/27/24 0422 08/27/24 0553 08/27/24 0851 08/27/24 1440  BP:  (!) 128/58 109/76 110/62  Pulse:  73 99 82  Resp:  18  16  Temp:  97.7 F  (36.5 C) 97.7 F (36.5 C) 98.4 F (36.9 C)  TempSrc:   Oral Oral  SpO2:  99% 100% 98%  Weight: 72.9 kg 71.2 kg    Height:        Intake/Output Summary (Last 24 hours) at 08/27/2024 1504 Last data filed at 08/27/2024 9161 Gross per 24 hour  Intake 1235.35 ml  Output 200 ml  Net 1035.35 ml   Filed Weights   08/26/24 2054 08/27/24 0422 08/27/24 0553  Weight: 72.9 kg 72.9 kg 71.2 kg    Examination:  Physical Exam: GEN: NAD, alert, pleasantly confused, elderly in appearance HEENT: NCAT, PERRL, EOMI, sclera clear, MMM PULM: CTAB w/o wheezes/crackles, normal respiratory effort, on room air CV: RRR w/o M/G/R GI: abd soft, NTND, + BS MSK: no peripheral edema, moves all extremities independently; noted amputation left 4th/5th toes, noted right BKA with prosthetic in place NEURO: No focal neurological deficit appreciated PSYCH: normal mood/affect Integumentary: No concerning rashes/lesions/wounds no exposed skin surfaces    Data Reviewed: I have personally reviewed following labs and imaging studies  CBC: Recent Labs  Lab 08/25/24 2302 08/26/24 0958 08/27/24 0608  WBC 4.7 4.4 3.7*  NEUTROABS 3.1 3.0  --   HGB 9.8* 8.5* 8.2*  HCT 32.4* 27.4* 28.3*  MCV 100.9* 100.0 101.4*  PLT 126* 100* 110*   Basic Metabolic Panel: Recent Labs  Lab 08/25/24 2302 08/26/24 0958 08/27/24 0608  NA 139 139 140  K 5.1 4.6 4.6  CL 110 112* 116*  CO2 19* 18* 18*  GLUCOSE 106* 92 84  BUN 48* 43* 42*  CREATININE 2.95* 2.57* 2.60*  CALCIUM  9.0 8.2* 8.2*  MG  --   --  2.1   GFR: Estimated Creatinine Clearance: 13.8 mL/min (A) (by C-G formula based on SCr of 2.6 mg/dL (H)). Liver Function Tests: Recent Labs  Lab 08/25/24 2302 08/27/24 0608  AST 17 19  ALT 7 8  ALKPHOS 53 43  BILITOT 0.2 0.2  PROT 5.9* 5.2*  ALBUMIN 3.8 3.2*   No results for input(s): LIPASE, AMYLASE in the last 168 hours. Recent Labs  Lab 08/27/24 0608  AMMONIA 28   Coagulation Profile: No results  for input(s): INR, PROTIME in the last 168 hours. Cardiac Enzymes: No results for input(s): CKTOTAL, CKMB, CKMBINDEX, TROPONINI in the last 168 hours. BNP (last 3 results) No results for input(s): PROBNP in the last 8760 hours. HbA1C: No results for input(s): HGBA1C in the last 72 hours. CBG: No results for input(s): GLUCAP in the last 168 hours. Lipid Profile: No results for input(s): CHOL, HDL, LDLCALC, TRIG, CHOLHDL, LDLDIRECT in the last 72 hours. Thyroid  Function Tests: Recent Labs    08/27/24 0608  TSH 1.200   Anemia Panel: Recent Labs    08/27/24 0608  VITAMINB12 657  FOLATE >20.0   Sepsis Labs: No results for input(s): PROCALCITON, LATICACIDVEN in the last 168 hours.  Recent Results (from the past 240 hours)  Urine Culture     Status: Abnormal (Preliminary result)   Collection Time: 08/25/24 10:56 PM   Specimen: Urine, Catheterized  Result Value Ref Range Status   Specimen Description   Final    URINE, CATHETERIZED Performed at [facility], [address], [place], [place] [address]    Special Requests   Final    NONE Performed at [facility], [address], [place], [place] [address]    Culture (A)  Final    >=100,000 COLONIES/mL KLEBSIELLA OXYTOCA SUSCEPTIBILITIES TO FOLLOW Performed at [facility], [address] [place], [place] [address]    Report Status PENDING  Incomplete         Radiology Studies: CT ABDOMEN PELVIS WO CONTRAST Result Date: 08/26/2024 EXAM: CT ABDOMEN AND PELVIS WITHOUT CONTRAST 08/26/2024 12:09:57 AM TECHNIQUE: CT of the abdomen and pelvis was performed without the administration of intravenous contrast. Multiplanar reformatted images are provided for review. Automated exposure control, iterative reconstruction, and/or weight-based adjustment of the mA/kV was utilized to reduce the radiation dose to as low as reasonably achievable. COMPARISON:  04/16/2024 CLINICAL HISTORY: ams, abd pain, uti, aki FINDINGS: LOWER CHEST: Coronary artery atherosclerosis. Small hiatal hernia. LIVER: The liver is unremarkable. GALLBLADDER AND BILE DUCTS: Prior cholecystectomy. No biliary ductal dilatation. SPLEEN: No acute abnormality. PANCREAS: No acute abnormality. ADRENAL GLANDS: No acute abnormality. KIDNEYS, URETERS AND BLADDER: No stones in the kidneys or ureters. No hydronephrosis. No perinephric or periureteral stranding. Urinary bladder is unremarkable. GI AND BOWEL: Stomach demonstrates no acute abnormality. Sigmoid diverticulosis. No active diverticulitis. Postoperative changes in the right colon. There is no bowel obstruction. PERITONEUM AND RETROPERITONEUM: No ascites. No free air. VASCULATURE: Aortoiliac atherosclerosis. IVC filter in the infrarenal IVC, left common iliac vein stent noted. LYMPH NODES: No lymphadenopathy. REPRODUCTIVE ORGANS: No acute abnormality. BONES AND SOFT TISSUES: Mild chronic L1 compression fracture is unchanged. Diffuse degenerative disc and facet disease. No focal soft tissue abnormality. IMPRESSION: 1. No acute findings in the abdomen or pelvis. 2. Sigmoid diverticulosis. No active diverticulitis. 3. Aortic atherosclerosis. Coronary artery disease. 4. Small hiatal hernia. Electronically signed by: Franky Crease MD 08/26/2024 12:43 AM EST RP Workstation: [id number]   CT Head Wo Contrast Result Date: 08/26/2024 EXAM: CT HEAD WITHOUT CONTRAST 08/26/2024 12:09:57 AM TECHNIQUE: CT of the head was performed without the administration of intravenous contrast. Automated exposure control, iterative reconstruction, and/or weight based adjustment of the mA/kV was utilized to reduce the radiation dose to as low as reasonably achievable. COMPARISON: CT head 08/22/2019 CLINICAL HISTORY: Delirium FINDINGS: BRAIN AND VENTRICLES: No acute hemorrhage. No evidence of acute infarct. No hydrocephalus. No extra-axial collection. No mass effect or midline  shift. Similar moderate patchy white matter hypodensities, compatible with chronic microvascular ischemic change. Cerebral atrophy. ORBITS: No acute abnormality. SINUSES: No acute abnormality. SOFT TISSUES AND SKULL: No acute soft tissue abnormality. No skull fracture. IMPRESSION: 1. No acute intracranial abnormality. Electronically signed by: Gilmore Molt MD 08/26/2024 12:21 AM EST RP Workstation: [id number]        Scheduled Meds:  carvedilol   12.5 mg Oral BID WC   clopidogrel   75 mg Oral QHS   donepezil   10 mg Oral QHS   rivaroxaban   2.5 mg Oral BID   Continuous Infusions:  sodium chloride  100 mL/hr at 08/27/24 1207   cefTRIAXone (ROCEPHIN)  IV Stopped (08/26/24 2009)     LOS: 1 day    Time spent: 52 minutes spent on 08/27/2024 caring for this patient face-to-face including chart review, ordering labs/tests, documenting, discussion with nursing staff, consultants, updating family and interview/physical exam    Camellia PARAS Maxine Huynh, DO [facility] Available via Epic secure chat 7am-7pm After these hours, please refer to coverage provider listed on [email or web address] 08/27/2024, 3:04 PM

## 2024-08-27 NOTE — Progress Notes (Signed)
   08/27/24 1507  TOC Brief Assessment  Insurance and Status Reviewed  Patient has primary care physician Yes Alvie, Aloysius BRAVO, MD)  Home environment has been reviewed FRom home patient is confused  unable to reach daughter or son for details of home situation  Prior level of function: Independent  Prior/Current Home Services No current home services  Social Drivers of Health Review SDOH reviewed no interventions necessary  Readmission risk has been reviewed Yes  Transition of care needs no transition of care needs at this time

## 2024-08-28 ENCOUNTER — Other Ambulatory Visit (HOSPITAL_COMMUNITY): Payer: Self-pay

## 2024-08-28 DIAGNOSIS — N179 Acute kidney failure, unspecified: Secondary | ICD-10-CM | POA: Diagnosis not present

## 2024-08-28 LAB — BASIC METABOLIC PANEL WITH GFR
Anion gap: 6 (ref 5–15)
BUN: 35 mg/dL — ABNORMAL HIGH (ref 8–23)
CO2: 17 mmol/L — ABNORMAL LOW (ref 22–32)
Calcium: 7.5 mg/dL — ABNORMAL LOW (ref 8.9–10.3)
Chloride: 117 mmol/L — ABNORMAL HIGH (ref 98–111)
Creatinine, Ser: 2.38 mg/dL — ABNORMAL HIGH (ref 0.44–1.00)
GFR, Estimated: 19 mL/min — ABNORMAL LOW (ref >60)
Glucose, Bld: 82 mg/dL (ref 70–99)
Potassium: 4.6 mmol/L (ref 3.5–5.1)
Sodium: 139 mmol/L (ref 135–145)

## 2024-08-28 LAB — URINE CULTURE: Culture: >=100000 — AB

## 2024-08-28 MED ORDER — MEDIHONEY WOUND/BURN DRESSING EX PSTE
1.0000 | PASTE | Freq: Every day | CUTANEOUS | Status: DC
  Filled 2024-08-28: qty 44

## 2024-08-28 MED ORDER — COLLAGENASE 250 UNIT/GM EX OINT
TOPICAL_OINTMENT | Freq: Every day | CUTANEOUS | 0 refills | Status: DC
  Filled 2024-08-28: qty 30, 20d supply, fill #0

## 2024-08-28 MED ORDER — CIPROFLOXACIN HCL 250 MG PO TABS
250.0000 mg | ORAL_TABLET | Freq: Every day | ORAL | 0 refills | Status: AC
  Filled 2024-08-28: qty 3, 3d supply, fill #0

## 2024-08-28 MED ORDER — COLLAGENASE 250 UNIT/GM EX OINT
TOPICAL_OINTMENT | Freq: Every day | CUTANEOUS | Status: DC
  Filled 2024-08-28: qty 30

## 2024-08-28 NOTE — Consult Note (Addendum)
 WOC Nurse Consult Note:  WOC consult performed remotely utilizing imaging and chart review  Reason for Consult: LE wounds Wound type: L heel unstageable pressure injury L 2nd and 3 rd toe full thickness wounds L medial foot wound- full thickness Stage 3 pressure injury to R buttocks Pressure Injury POA: Yes Measurement: see nursing flow sheets Wound bed:  L heel: 100% black eschar L 2nd and 3rd toes: 100%  dry yellow slough L medial foot: 100% brown black dry eschar R buttocks:  100% pink, moist Drainage (amount, consistency, odor) see nursing flow sheets Periwound: intact Dressing procedure/placement/frequency:  L heel/ l medial foot: paint with betadine and leave open to air.  Off load L heel with Prevalon boot (lawson # [id number])  L 2nd/ 3rd toes: cleanse with NS, pat dry, Apply 1/4 thick layer of Santyl to wound bed, top with saline moist gauze. Top with silicone foam.  Ok to lift foam daily for change of gauze and reapplication of Santyl. Ok to change silicone foam every 3 days.      R buttocks: Cleanse with NS, pat dry.  Place xeroform over wound bed and cover with silicone foam dressing.  Change daily           WOC team will not follow patient at this time, please re consult if new needs arise or wounds deteriorate.  Thank you,  Doyal Polite, MSN, RN, [facility] WOC Team [phone] (Available Mon-Fri 0700-1500)

## 2024-08-28 NOTE — Plan of Care (Signed)
  Problem: Activity: Goal: Risk for activity intolerance will decrease Outcome: Progressing   Problem: Nutrition: Goal: Adequate nutrition will be maintained Outcome: Progressing   Problem: Coping: Goal: Level of anxiety will decrease Outcome: Progressing   Problem: Elimination: Goal: Will not experience complications related to urinary retention Outcome: Progressing   

## 2024-08-28 NOTE — Progress Notes (Signed)
 Discharge meds in a secure bag delivered to patient

## 2024-08-28 NOTE — Plan of Care (Addendum)
 Dressing change, AVS, and future appointments went over with patient and daughter at the room. PIV removed, and questions answered.  Daughter to transport patient home.  Gauze, normal saline, and wound care supplies given to patient for wound care. Dressings are clean, dry, intact. WOUND CARE:   Left heel/ left medial foot: paint with betadine and leave open to air.  Off load L heel with Prevalon boot   Left 2nd/ 3rd toes: cleanse with Normal Saline, pat dry, Apply 1/4 thick layer of Santyl to wound bed, top with saline moist gauze. Top with silicone foam.   Ok to lift foam daily for change of gauze and reapplication of Santyl.  Ok to change silicone foam every 3 days.      Right buttocks: Cleanse with Normal Saline, pat dry.   Place xeroform over wound bed and cover with silicone foam dressing.   Change daily       Problem: Education: Goal: Knowledge of General Education information will improve Description: Including pain rating scale, medication(s)/side effects and non-pharmacologic comfort measures Outcome: Adequate for Discharge   Problem: Health Behavior/Discharge Planning: Goal: Ability to manage health-related needs will improve Outcome: Adequate for Discharge   Problem: Clinical Measurements: Goal: Ability to maintain clinical measurements within normal limits will improve Outcome: Adequate for Discharge Goal: Will remain free from infection Outcome: Adequate for Discharge Goal: Diagnostic test results will improve Outcome: Adequate for Discharge Goal: Respiratory complications will improve Outcome: Adequate for Discharge Goal: Cardiovascular complication will be avoided Outcome: Adequate for Discharge   Problem: Activity: Goal: Risk for activity intolerance will decrease Outcome: Adequate for Discharge   Problem: Nutrition: Goal: Adequate nutrition will be maintained Outcome: Adequate for Discharge   Problem: Coping: Goal: Level of anxiety will  decrease Outcome: Adequate for Discharge   Problem: Elimination: Goal: Will not experience complications related to bowel motility Outcome: Adequate for Discharge Goal: Will not experience complications related to urinary retention Outcome: Adequate for Discharge   Problem: Pain Managment: Goal: General experience of comfort will improve and/or be controlled Outcome: Adequate for Discharge   Problem: Safety: Goal: Ability to remain free from injury will improve Outcome: Adequate for Discharge   Problem: Skin Integrity: Goal: Risk for impaired skin integrity will decrease Outcome: Adequate for Discharge

## 2024-08-28 NOTE — Discharge Summary (Signed)
 Physician Discharge Summary  [name] FMW:[id number] DOB: [birth year] DOA: 08/25/2024  PCP: Amon Aloysius BRAVO, MD  Admit date: 08/25/2024 Discharge date: 08/28/2024  Admitted From: Home Disposition: Home  Recommendations for Outpatient Follow-up:  Follow up with PCP in 1-2 weeks Please obtain BMP in one week to recheck kidney function Discharge on Cipro  to complete antibiotic course for UTI  Home Health: No Equipment/Devices: None  Discharge Condition: Stable CODE STATUS: Full code Diet recommendation: Regular diet  History of present illness:  [name] is a 89 y.o. 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  Family concerns this is a recurrent UTI given similar symptoms in the past.  No fever/vomiting.   In the ED, temperature 97.8 F, HR 92, RR 16, BP 115/74, SpO2 100% on room air.  WBC 4.7, hemoglobin 9.8, platelet count 126.  Sodium 139, potassium 5.1, chloride 110, CO2 19, glucose 106, BUN 48, creatinine 2.95.  AST 17, ALT 7, total bilirubin 0.2.  Ammonia 28.  Urinalysis with large leukocytes, positive nitrite, many bacteria, greater than 50 WBCs.  CT head without contrast with no acute intracranial abnormality.  CT abdomen/pelvis with no acute findings in abdomen/pelvis, sigmoid diverticulosis without active diverticulitis, aortic atherosclerosis, coronary artery disease, small hiatal hernia.  Patient was given IV fluids, 1 dose of oral fosfomycin.  TRH consulted for admission and patient was transferred to [facility] for further evaluation and management of UTI.  Hospital course:  Acute metabolic encephalopathy, POA Klebsiella oxytoca  UTI Patient presenting with confusion, painful urination.  Similar symptoms in the past 2 recurrent UTI.  Review of EMR notable for previous E. coli, Klebsiella, Enterococcus UTI.  Urinalysis consistent with UTI.  Ammonia level within normal limits.  CT head without contrast unrevealing.  Received dose of fosfomycin in the ED. started on IV ceftriaxone, transition to Cipro  on discharge complete 5-day course.  Acute renal failure on CKD stage IIIa Baseline creatinine 1.1-1.4.  Presenting with elevated creatinine of 2.95.  No hydronephrosis on CT abdomen/pelvis. Etiology likely secondary to prerenal azotemia in setting of poor oral intake in the days preceding hospitalization.  Patient was started on IV fluid hydration with improvement of creatinine to 2.3 as of discharge.  Continue to encourage increased oral intake.  Recommend repeat BMP 1 week to reassess kidney function.     HTN Carvedilol  12.5 mg p.o. twice daily   DM2 Diet controlled at baseline.   HLD Atorvastatin  40 m p.o. daily   Hx PE Xarelto  2.5 mg p.o. twice daily   CAD s/p CABG/stent Plavix  75 mg p.o. daily, atorvastatin  40 mg p.o. daily   Dementia Aricept  10 mg p.o. nightly  Pressure injury left heel/foot, POA Wound 08/27/24 2100 Pressure Injury Heel Left (Active)  L heel/ l medial foot: paint with betadine and leave open to air.  Off load L heel with Prevalon boot (lawson # [id number])  L 2nd/ 3rd toes: cleanse with NS, pat dry, Apply 1/4 thick layer of Santyl to wound bed, top with saline moist gauze. Top with silicone foam.  Ok to lift foam daily for change of gauze and reapplication of Santyl. Ok to change silicone foam every 3 days.    R buttocks:  Cleanse with NS, pat dry.  Place xeroform over wound bed and cover with silicone foam dressing.  Change daily  Discharge Diagnoses:  Principal Problem:   AKI (acute kidney injury)    Discharge Instructions  Discharge Instructions     Call MD for:  difficulty breathing,  headache or visual disturbances   Complete by: As directed    Call MD for:  extreme fatigue   Complete by: As directed    Call MD for:  persistant dizziness or light-headedness   Complete by: As directed    Call MD for:  persistant nausea and vomiting   Complete by: As directed    Call MD for:  severe uncontrolled pain   Complete by: As directed    Call MD for:  temperature >100.4   Complete by: As directed    Diet - low sodium heart healthy   Complete by: As directed    Discharge wound care:   Complete by: As directed    L heel/ l medial foot: paint with betadine and leave open to air.  Off load L heel with Prevalon boot   L 2nd/ 3rd toes: cleanse with NS, pat dry, Apply 1/4 thick layer of Santyl to wound bed, top with saline moist gauze. Top with silicone foam.  Ok to lift foam daily for change of gauze and reapplication of Santyl. Ok to change silicone foam every 3 days.      R buttocks: Cleanse with NS, pat dry.  Place xeroform over wound bed and cover with silicone foam dressing.  Change daily   Increase activity slowly   Complete by: As directed       Allergies as of 08/28/2024       Reactions   Metformin  And Related Diarrhea, Nausea Only, Other (See Comments)   dizzy, tired, chills   Cymbalta [duloxetine Hcl] Swelling   Swelling in legs   Gabapentin Swelling   Swelling in legs   Silicone Hives, Itching, Dermatitis, Rash   Tramadol Swelling   Adhesive [tape] Rash   Cefazolin Hives      Ciprofloxacin  Other (See Comments)   body aches   Clorazepate Dipotassium Other (See Comments)   Unknown reaction   Dilaudid  [hydromorphone  Hcl] Itching    confused,   Doxycycline Rash   Enablex [darifenacin Hydrobromide Er] Other (See Comments)   Hypotension, near syncope   Levofloxacin Other (See Comments)   Causes wrist pain   Lyrica [pregabalin] Swelling   Swelling in legs   Methadone Hcl Other (See Comments)   Reaction unknown   Morphine  And Codeine Other (See Comments)    Confusion, constipation.   Talwin [pentazocine] Other (See Comments)   climbing walls anxiety        Medication List     STOP taking these medications    diclofenac  Sodium 1 % Gel Commonly known as: VOLTAREN    famotidine  20 MG tablet Commonly known as: PEPCID    HYDROcodone -acetaminophen  10-325 MG tablet Commonly known as: NORCO   hydrocortisone  2.5 % cream   hydrocortisone  25 MG suppository Commonly known as: ANUSOL -HC   ketoconazole  2 % cream Commonly known as: NIZORAL    MegaRed Omega-3 Krill Oil 500 MG Caps   omeprazole  20 MG capsule Commonly known as: PRILOSEC   PreserVision AREDS 2 Caps   silver  sulfADIAZINE  1 % cream Commonly known as: Silvadene        TAKE these medications    Alpha-Lipoic Acid 600 MG Caps Take 1,200 mg by mouth daily.   amitriptyline   25 MG tablet Commonly known as: ELAVIL  Take 1 tablet (25 mg total) by mouth at bedtime.   atorvastatin  40 MG tablet Commonly known as: LIPITOR Take 40 mg by mouth daily.   BERBERINE CHLORIDE PO Take by mouth. 1 every morning, 2 at bedtime. OTC   calcium  carbonate 750 MG chewable tablet Commonly known as: TUMS EX Chew 4 tablets by mouth daily.   carvedilol  12.5 MG tablet Commonly known as: COREG  Take 12.5 mg by mouth 2 (two) times daily with a meal.   CINNAMON PO Take 1 tablet by mouth daily.   ciprofloxacin  250 MG tablet Commonly known as: Cipro  Take 1 tablet (250 mg total) by mouth daily for 3 days.   clopidogrel  75 MG tablet Commonly known as: PLAVIX  Take 1 tablet (75 mg total) by mouth at bedtime. Restart on 12/04/2020   collagenase 250 UNIT/GM ointment Commonly known as: SANTYL Apply topically daily.   Cranberry 200 MG Caps Take 2 capsules by mouth daily. *36 mg extract*   dicyclomine  10 MG capsule Commonly known as: BENTYL  TAKE 1 CAPSULE BY MOUTH THREE TIMES DAILY BEFORE MEALS   donepezil  10 MG tablet Commonly known as: ARICEPT  Take 1 tablet (10 mg total) by mouth at  bedtime.   naloxone  4 MG/0.1ML Liqd nasal spray kit Commonly known as: NARCAN  Place 1 spray into the nose once as needed for up to 1 dose.   nitroGLYCERIN  0.4 MG SL tablet Commonly known as: NITROSTAT  Place 1 tablet (0.4 mg total) under the tongue every 5 (five) minutes x 3 doses as needed for chest pain.   ondansetron  4 MG tablet Commonly known as: ZOFRAN  Take 1 tablet (4 mg total) by mouth every 8 (eight) hours as needed for nausea or vomiting.   pramipexole  0.125 MG tablet Commonly known as: MIRAPEX  Take 1-2 tablets (0.125-0.25 mg total) by mouth at bedtime as needed.   PROBIOTIC PEARLS PO Take 1 tablet by mouth daily.   rivaroxaban  2.5 MG Tabs tablet Commonly known as: XARELTO  Take 1 tablet (2.5 mg total) by mouth 2 (two) times daily. Restart on 12/04/2020   vitamin C with rose hips 500 MG tablet Take 500 mg by mouth in the morning and at bedtime.   Zinc Sulfate 66 MG Tabs               Discharge Care Instructions  (From admission, onward)           Start     Ordered   08/28/24 0000  Discharge wound care:       Comments: L heel/ l medial foot: paint with betadine and leave open to air.  Off load L heel with Prevalon boot   L 2nd/ 3rd toes: cleanse with NS, pat dry, Apply 1/4 thick layer of Santyl to wound bed, top with saline moist gauze. Top with silicone foam.  Ok to lift foam daily for change of gauze and reapplication of Santyl. Ok to change silicone foam every 3 days.      R buttocks: Cleanse with NS, pat dry.  Place xeroform over wound bed and cover with silicone foam dressing.  Change daily   08/28/24 0945            Follow-up Information     Amon Aloysius BRAVO, MD.   Specialty: Internal Medicine Contact information: [address] [place] [address] [phone]                Allergies  Allergen Reactions  Metformin  And Related Diarrhea, Nausea Only and Other (See Comments)    dizzy, tired, chills   Cymbalta  [Duloxetine Hcl] Swelling    Swelling in legs   Gabapentin Swelling    Swelling in legs   Silicone Hives, Itching, Dermatitis and Rash   Tramadol Swelling   Adhesive [Tape] Rash   Cefazolin Hives        Ciprofloxacin  Other (See Comments)    body aches    Clorazepate Dipotassium Other (See Comments)    Unknown reaction   Dilaudid  [Hydromorphone  Hcl] Itching     confused,   Doxycycline Rash   Enablex [Darifenacin Hydrobromide Er] Other (See Comments)    Hypotension, near syncope   Levofloxacin Other (See Comments)    Causes wrist pain   Lyrica [Pregabalin] Swelling    Swelling in legs   Methadone Hcl Other (See Comments)    Reaction unknown   Morphine  And Codeine Other (See Comments)    Confusion, constipation.    Talwin [Pentazocine] Other (See Comments)    climbing walls anxiety    Consultations: None   Procedures/Studies: CT ABDOMEN PELVIS WO CONTRAST Result Date: 08/26/2024 EXAM: CT ABDOMEN AND PELVIS WITHOUT CONTRAST 08/26/2024 12:09:57 AM TECHNIQUE: CT of the abdomen and pelvis was performed without the administration of intravenous contrast. Multiplanar reformatted images are provided for review. Automated exposure control, iterative reconstruction, and/or weight-based adjustment of the mA/kV was utilized to reduce the radiation dose to as low as reasonably achievable. COMPARISON: 04/16/2024 CLINICAL HISTORY: ams, abd pain, uti, aki FINDINGS: LOWER CHEST: Coronary artery atherosclerosis. Small hiatal hernia. LIVER: The liver is unremarkable. GALLBLADDER AND BILE DUCTS: Prior cholecystectomy. No biliary ductal dilatation. SPLEEN: No acute abnormality. PANCREAS: No acute abnormality. ADRENAL GLANDS: No acute abnormality. KIDNEYS, URETERS AND BLADDER: No stones in the kidneys or ureters. No hydronephrosis. No perinephric or periureteral stranding. Urinary bladder is unremarkable. GI AND BOWEL: Stomach demonstrates no acute abnormality. Sigmoid diverticulosis. No active  diverticulitis. Postoperative changes in the right colon. There is no bowel obstruction. PERITONEUM AND RETROPERITONEUM: No ascites. No free air. VASCULATURE: Aortoiliac atherosclerosis. IVC filter in the infrarenal IVC, left common iliac vein stent noted. LYMPH NODES: No lymphadenopathy. REPRODUCTIVE ORGANS: No acute abnormality. BONES AND SOFT TISSUES: Mild chronic L1 compression fracture is unchanged. Diffuse degenerative disc and facet disease. No focal soft tissue abnormality. IMPRESSION: 1. No acute findings in the abdomen or pelvis. 2. Sigmoid diverticulosis. No active diverticulitis. 3. Aortic atherosclerosis. Coronary artery disease. 4. Small hiatal hernia. Electronically signed by: Franky Crease MD 08/26/2024 12:43 AM EST RP Workstation: [id number]   CT Head Wo Contrast Result Date: 08/26/2024 EXAM: CT HEAD WITHOUT CONTRAST 08/26/2024 12:09:57 AM TECHNIQUE: CT of the head was performed without the administration of intravenous contrast. Automated exposure control, iterative reconstruction, and/or weight based adjustment of the mA/kV was utilized to reduce the radiation dose to as low as reasonably achievable. COMPARISON: CT head 08/22/2019 CLINICAL HISTORY: Delirium FINDINGS: BRAIN AND VENTRICLES: No acute hemorrhage. No evidence of acute infarct. No hydrocephalus. No extra-axial collection. No mass effect or midline shift. Similar moderate patchy white matter hypodensities, compatible with chronic microvascular ischemic change. Cerebral atrophy. ORBITS: No acute abnormality. SINUSES: No acute abnormality. SOFT TISSUES AND SKULL: No acute soft tissue abnormality. No skull fracture. IMPRESSION: 1. No acute intracranial abnormality. Electronically signed by: Gilmore Molt MD 08/26/2024 12:21 AM EST RP Workstation: [id number]     Subjective: Patient seen examined bedside, lying in bed.  Family present at bedside.  Eating breakfast.  No complaints.  Discharging home today.  Denies headache, no chest  pain, no short of breath, abdominal pain, no fever.  No acute events overnight per nursing staff.  Discharge Exam: Vitals:   08/27/24 2037 08/28/24 0551  BP: (!) 98/57 (!) 112/52  Pulse: 74 74  Resp: 18 18  Temp: 98.5 F (36.9 C) 98.4 F (36.9 C)  SpO2: 100% 97%   Vitals:   08/27/24 0851 08/27/24 1440 08/27/24 2037 08/28/24 0551  BP: 109/76 110/62 (!) 98/57 (!) 112/52  Pulse: 99 82 74 74  Resp:  16 18 18   Temp: 97.7 F (36.5 C) 98.4 F (36.9 C) 98.5 F (36.9 C) 98.4 F (36.9 C)  TempSrc: Oral Oral    SpO2: 100% 98% 100% 97%  Weight:    73.9 kg  Height:        Physical Exam: GEN: NAD, alert, pleasantly confused, elderly in appearance HEENT: NCAT, PERRL, EOMI, sclera clear, MMM PULM: CTAB w/o wheezes/crackles, normal respiratory effort, on room air CV: RRR w/o M/G/R GI: abd soft, NTND, + BS MSK: no peripheral edema, moves all extremities independently; noted amputation left 4th/5th toes, noted right BKA with prosthetic in place NEURO: No focal neurological deficit appreciated PSYCH: normal mood/affect Integumentary: Pressure injuries/wound to left foot, buttock as depicted below               The results of significant diagnostics from this hospitalization (including imaging, microbiology, ancillary and laboratory) are listed below for reference.     Microbiology: Recent Results (from the past 240 hours)  Urine Culture     Status: Abnormal   Collection Time: 08/25/24 10:56 PM   Specimen: Urine, Catheterized  Result Value Ref Range Status   Specimen Description   Final    URINE, CATHETERIZED Performed at [facility], [address], [place], [place] [address]    Special Requests   Final    NONE Performed at [facility], [address] [place], [place] [address]    Culture >=100,000 COLONIES/mL KLEBSIELLA OXYTOCA (A)  Final   Report Status 08/28/2024 FINAL  Final   Organism ID, Bacteria KLEBSIELLA OXYTOCA  (A)  Final      Susceptibility   Klebsiella oxytoca - MIC*    AMPICILLIN >=32 RESISTANT Resistant     CEFEPIME <=0.12 SENSITIVE Sensitive     ERTAPENEM <=0.12 SENSITIVE Sensitive     CEFTRIAXONE <=0.25 SENSITIVE Sensitive     CIPROFLOXACIN  <=0.06 SENSITIVE Sensitive     GENTAMICIN  <=1 SENSITIVE Sensitive     NITROFURANTOIN  <=16 SENSITIVE Sensitive     TRIMETH /SULFA  <=20 SENSITIVE Sensitive     AMPICILLIN/SULBACTAM 8 SENSITIVE Sensitive     PIP/TAZO Value in next row Sensitive      <=4 SENSITIVEThis is a modified FDA-approved test that has been validated and its performance characteristics determined by the reporting laboratory.  This laboratory is certified under the Clinical Laboratory Improvement Amendments CLIA as qualified to perform high complexity clinical laboratory testing.    MEROPENEM Value in next row Sensitive      <=4 SENSITIVEThis is a modified FDA-approved test that has been validated and its performance characteristics determined by the reporting laboratory.  This laboratory is certified under the Clinical Laboratory Improvement Amendments CLIA as qualified to perform high complexity clinical laboratory testing.    * >=100,000 COLONIES/mL KLEBSIELLA OXYTOCA     Labs: BNP (last 3 results) No results for input(s): BNP in the last 8760 hours. Basic Metabolic Panel: Recent Labs  Lab 08/25/24 2302 08/26/24  9041 08/27/24 0608 08/28/24 0633  NA 139 139 140 139  K 5.1 4.6 4.6 4.6  CL 110 112* 116* 117*  CO2 19* 18* 18* 17*  GLUCOSE 106* 92 84 82  BUN 48* 43* 42* 35*  CREATININE 2.95* 2.57* 2.60* 2.38*  CALCIUM  9.0 8.2* 8.2* 7.5*  MG  --   --  2.1  --    Liver Function Tests: Recent Labs  Lab 08/25/24 2302 08/27/24 0608  AST 17 19  ALT 7 8  ALKPHOS 53 43  BILITOT 0.2 0.2  PROT 5.9* 5.2*  ALBUMIN 3.8 3.2*   No results for input(s): LIPASE, AMYLASE in the last 168 hours. Recent Labs  Lab 08/27/24 0608  AMMONIA 28   CBC: Recent Labs  Lab  08/25/24 2302 08/26/24 0958 08/27/24 0608  WBC 4.7 4.4 3.7*  NEUTROABS 3.1 3.0  --   HGB 9.8* 8.5* 8.2*  HCT 32.4* 27.4* 28.3*  MCV 100.9* 100.0 101.4*  PLT 126* 100* 110*   Cardiac Enzymes: No results for input(s): CKTOTAL, CKMB, CKMBINDEX, TROPONINI in the last 168 hours. BNP: Invalid input(s): POCBNP CBG: No results for input(s): GLUCAP in the last 168 hours. D-Dimer No results for input(s): DDIMER in the last 72 hours. Hgb A1c No results for input(s): HGBA1C in the last 72 hours. Lipid Profile No results for input(s): CHOL, HDL, LDLCALC, TRIG, CHOLHDL, LDLDIRECT in the last 72 hours. Thyroid  function studies Recent Labs    08/27/24 0608  TSH 1.200   Anemia work up Recent Labs    08/27/24 0608  VITAMINB12 657  FOLATE >20.0   Urinalysis    Component Value Date/Time   COLORURINE YELLOW 08/25/2024 2136   APPEARANCEUR CLOUDY (A) 08/25/2024 2136   LABSPEC 1.013 08/25/2024 2136   PHURINE 5.5 08/25/2024 2136   GLUCOSEU NEGATIVE 08/25/2024 2136   GLUCOSEU NEGATIVE 10/19/2017 1001   HGBUR NEGATIVE 08/25/2024 2136   HGBUR negative 07/27/2008 1055   BILIRUBINUR NEGATIVE 08/25/2024 2136   BILIRUBINUR Negative 08/30/2020 0922   KETONESUR NEGATIVE 08/25/2024 2136   PROTEINUR NEGATIVE 08/25/2024 2136   UROBILINOGEN 0.2 08/30/2020 0922   UROBILINOGEN 0.2 10/19/2017 1001   NITRITE POSITIVE (A) 08/25/2024 2136   LEUKOCYTESUR LARGE (A) 08/25/2024 2136   Sepsis Labs Recent Labs  Lab 08/25/24 2302 08/26/24 0958 08/27/24 0608  WBC 4.7 4.4 3.7*   Microbiology Recent Results (from the past 240 hours)  Urine Culture     Status: Abnormal   Collection Time: 08/25/24 10:56 PM   Specimen: Urine, Catheterized  Result Value Ref Range Status   Specimen Description   Final    URINE, CATHETERIZED Performed at [facility], [address] [place], [place] [address]    Special Requests   Final    NONE Performed at [facility], [address], [place], [place] [address]    Culture >=100,000 COLONIES/mL KLEBSIELLA OXYTOCA (A)  Final   Report Status 08/28/2024 FINAL  Final   Organism ID, Bacteria KLEBSIELLA OXYTOCA (A)  Final      Susceptibility   Klebsiella oxytoca - MIC*    AMPICILLIN >=32 RESISTANT Resistant     CEFEPIME <=0.12 SENSITIVE Sensitive     ERTAPENEM <=0.12 SENSITIVE Sensitive     CEFTRIAXONE <=0.25 SENSITIVE Sensitive     CIPROFLOXACIN  <=0.06 SENSITIVE Sensitive     GENTAMICIN  <=1 SENSITIVE Sensitive     NITROFURANTOIN  <=16 SENSITIVE Sensitive     TRIMETH /SULFA  <=20 SENSITIVE Sensitive     AMPICILLIN/SULBACTAM 8 SENSITIVE Sensitive  PIP/TAZO Value in next row Sensitive      <=4 SENSITIVEThis is a modified FDA-approved test that has been validated and its performance characteristics determined by the reporting laboratory.  This laboratory is certified under the Clinical Laboratory Improvement Amendments CLIA as qualified to perform high complexity clinical laboratory testing.    MEROPENEM Value in next row Sensitive      <=4 SENSITIVEThis is a modified FDA-approved test that has been validated and its performance characteristics determined by the reporting laboratory.  This laboratory is certified under the Clinical Laboratory Improvement Amendments CLIA as qualified to perform high complexity clinical laboratory testing.    * >=100,000 COLONIES/mL KLEBSIELLA OXYTOCA     Time coordinating discharge: Over 30 minutes  SIGNED:   Camellia PARAS Tharun Cappella, DO  [facility] 08/28/2024, 9:45 AM

## 2024-09-01 ENCOUNTER — Encounter: Payer: Self-pay | Admitting: Family

## 2024-09-01 ENCOUNTER — Inpatient Hospital Stay: Attending: Family

## 2024-09-01 ENCOUNTER — Inpatient Hospital Stay: Admitting: Family

## 2024-09-01 VITALS — BP 132/64 | HR 74 | Temp 97.6°F | Resp 20

## 2024-09-01 DIAGNOSIS — D509 Iron deficiency anemia, unspecified: Secondary | ICD-10-CM

## 2024-09-01 DIAGNOSIS — D709 Neutropenia, unspecified: Secondary | ICD-10-CM | POA: Diagnosis not present

## 2024-09-01 DIAGNOSIS — D500 Iron deficiency anemia secondary to blood loss (chronic): Secondary | ICD-10-CM | POA: Insufficient documentation

## 2024-09-01 DIAGNOSIS — D649 Anemia, unspecified: Secondary | ICD-10-CM

## 2024-09-01 DIAGNOSIS — K922 Gastrointestinal hemorrhage, unspecified: Secondary | ICD-10-CM | POA: Insufficient documentation

## 2024-09-01 DIAGNOSIS — R410 Disorientation, unspecified: Secondary | ICD-10-CM | POA: Insufficient documentation

## 2024-09-01 DIAGNOSIS — E538 Deficiency of other specified B group vitamins: Secondary | ICD-10-CM

## 2024-09-01 DIAGNOSIS — N390 Urinary tract infection, site not specified: Secondary | ICD-10-CM | POA: Insufficient documentation

## 2024-09-01 DIAGNOSIS — D696 Thrombocytopenia, unspecified: Secondary | ICD-10-CM | POA: Insufficient documentation

## 2024-09-01 LAB — CBC WITH DIFFERENTIAL (CANCER CENTER ONLY)
Abs Immature Granulocytes: 0.01 K/uL (ref 0.00–0.07)
Basophils Absolute: 0 K/uL (ref 0.0–0.1)
Basophils Relative: 0 %
Eosinophils Absolute: 0.2 K/uL (ref 0.0–0.5)
Eosinophils Relative: 4 %
HCT: 31.3 % — ABNORMAL LOW (ref 36.0–46.0)
Hemoglobin: 9.7 g/dL — ABNORMAL LOW (ref 12.0–15.0)
Immature Granulocytes: 0 %
Lymphocytes Relative: 21 %
Lymphs Abs: 1 K/uL (ref 0.7–4.0)
MCH: 30.5 pg (ref 26.0–34.0)
MCHC: 31 g/dL (ref 30.0–36.0)
MCV: 98.4 fL (ref 80.0–100.0)
Monocytes Absolute: 0.3 K/uL (ref 0.1–1.0)
Monocytes Relative: 6 %
Neutro Abs: 3.1 K/uL (ref 1.7–7.7)
Neutrophils Relative %: 69 %
Platelet Count: 121 K/uL — ABNORMAL LOW (ref 150–400)
RBC: 3.18 MIL/uL — ABNORMAL LOW (ref 3.87–5.11)
RDW: 14.1 % (ref 11.5–15.5)
WBC Count: 4.6 K/uL (ref 4.0–10.5)
nRBC: 0 % (ref 0.0–0.2)

## 2024-09-01 LAB — IRON AND IRON BINDING CAPACITY (CC-WL,HP ONLY)
Iron: 56 ug/dL (ref 28–170)
Saturation Ratios: 20 % (ref 10.4–31.8)
TIBC: 284 ug/dL (ref 250–450)
UIBC: 228 ug/dL

## 2024-09-01 LAB — CMP (CANCER CENTER ONLY)
ALT: 7 U/L (ref 0–44)
AST: 17 U/L (ref 15–41)
Albumin: 3.7 g/dL (ref 3.5–5.0)
Alkaline Phosphatase: 46 U/L (ref 38–126)
Anion gap: 13 (ref 5–15)
BUN: 29 mg/dL — ABNORMAL HIGH (ref 8–23)
CO2: 17 mmol/L — ABNORMAL LOW (ref 22–32)
Calcium: 8.8 mg/dL — ABNORMAL LOW (ref 8.9–10.3)
Chloride: 112 mmol/L — ABNORMAL HIGH (ref 98–111)
Creatinine: 2.34 mg/dL — ABNORMAL HIGH (ref 0.44–1.00)
GFR, Estimated: 20 mL/min — ABNORMAL LOW (ref >60)
Glucose, Bld: 124 mg/dL — ABNORMAL HIGH (ref 70–99)
Potassium: 3.8 mmol/L (ref 3.5–5.1)
Sodium: 143 mmol/L (ref 135–145)
Total Bilirubin: 0.3 mg/dL (ref 0.0–1.2)
Total Protein: 5.9 g/dL — ABNORMAL LOW (ref 6.5–8.1)

## 2024-09-01 LAB — RETICULOCYTES
Immature Retic Fract: 11.7 % (ref 2.3–15.9)
RBC.: 3.2 MIL/uL — ABNORMAL LOW (ref 3.87–5.11)
Retic Count, Absolute: 35.8 K/uL (ref 19.0–186.0)
Retic Ct Pct: 1.1 % (ref 0.4–3.1)

## 2024-09-01 LAB — SAMPLE TO BLOOD BANK

## 2024-09-01 LAB — FERRITIN: Ferritin: 18 ng/mL (ref 11–307)

## 2024-09-01 LAB — VITAMIN B12: Vitamin B-12: 634 pg/mL (ref 180–914)

## 2024-09-01 NOTE — Progress Notes (Signed)
 Hematology and Oncology Follow Up Visit  [name] [id number] [birth year] 89 y.o. 09/01/2024   Principle Diagnosis:  Iron deficiency anemia  Mild thrombocytopenia  Mild Neutropenia   Current Therapy:        IV iron as indicated    Interim History:  [name] is here today with her grandson for follow-up. She is recovering from a UTI and recent hospitalization. She is still mildly confused per her grandson.  She has felt cold at times.  No fever, n/v, cough, rash, dizziness, SOB, chest pain, palpitations, abdominal pain or changes in bowel or bladder habits.  Chronic swelling in the left lower extremity due to wound. This is wrapped and she is wearing a pressure prevention boot.  No c/o pain at this time.  No falls or syncope. She only transfers with assistance.  Appetite has been poor since being home. Family is encouraging her to eat and hydrate. Unable to stand for weight.   ECOG Performance Status: 2 - Symptomatic, <50% confined to bed  Medications:  Allergies as of 09/01/2024       Reactions   Metformin  And Related Diarrhea, Nausea Only, Other (See Comments)   dizzy, tired, chills   Cymbalta [duloxetine Hcl] Swelling   Swelling in legs   Gabapentin Swelling   Swelling in legs   Silicone Hives, Itching, Dermatitis, Rash   Tramadol Swelling   Adhesive [tape] Rash   Cefazolin Hives      Ciprofloxacin  Other (See Comments)   body aches   Clorazepate Dipotassium Other (See Comments)   Unknown reaction   Dilaudid  [hydromorphone  Hcl] Itching    confused,   Doxycycline Rash   Enablex [darifenacin Hydrobromide Er] Other (See Comments)   Hypotension, near syncope   Levofloxacin Other (See Comments)   Causes wrist pain   Lyrica [pregabalin] Swelling   Swelling in legs   Methadone Hcl Other (See Comments)   Reaction unknown   Morphine  And Codeine Other (See Comments)   Confusion, constipation.   Talwin [pentazocine] Other (See Comments)   climbing walls  anxiety        Medication List        Accurate as of September 01, 2024  1:31 PM. If you have any questions, ask your nurse or doctor.          Alpha-Lipoic Acid 600 MG Caps Take 1,200 mg by mouth daily.   amitriptyline  25 MG tablet Commonly known as: ELAVIL  Take 1 tablet (25 mg total) by mouth at bedtime.   atorvastatin  40 MG tablet Commonly known as: LIPITOR Take 40 mg by mouth daily.   BERBERINE CHLORIDE PO Take by mouth. 1 every morning, 2 at bedtime. OTC   calcium  carbonate 750 MG chewable tablet Commonly known as: TUMS EX Chew 4 tablets by mouth daily.   carvedilol  12.5 MG tablet Commonly known as: COREG  Take 12.5 mg by mouth 2 (two) times daily with a meal.   CINNAMON PO Take 1 tablet by mouth daily.   clopidogrel  75 MG tablet Commonly known as: PLAVIX  Take 1 tablet (75 mg total) by mouth at bedtime. Restart on 12/04/2020   Cranberry 200 MG Caps Take 2 capsules by mouth daily. *36 mg extract*   dicyclomine  10 MG capsule Commonly known as: BENTYL  TAKE 1 CAPSULE BY MOUTH THREE TIMES DAILY BEFORE MEALS   donepezil  10 MG tablet Commonly known as: ARICEPT  Take 1 tablet (10 mg total) by mouth at bedtime.   naloxone  4 MG/0.1ML Liqd nasal spray kit Commonly  known as: NARCAN  Place 1 spray into the nose once as needed for up to 1 dose.   nitroGLYCERIN  0.4 MG SL tablet Commonly known as: NITROSTAT  Place 1 tablet (0.4 mg total) under the tongue every 5 (five) minutes x 3 doses as needed for chest pain.   ondansetron  4 MG tablet Commonly known as: ZOFRAN  Take 1 tablet (4 mg total) by mouth every 8 (eight) hours as needed for nausea or vomiting.   pramipexole  0.125 MG tablet Commonly known as: MIRAPEX  Take 1-2 tablets (0.125-0.25 mg total) by mouth at bedtime as needed.   PROBIOTIC PEARLS PO Take 1 tablet by mouth daily.   rivaroxaban  2.5 MG Tabs tablet Commonly known as: XARELTO  Take 1 tablet (2.5 mg total) by mouth 2 (two) times daily. Restart on  12/04/2020   Santyl 250 UNIT/GM ointment Generic drug: collagenase Apply topically daily.   vitamin C with rose hips 500 MG tablet Take 500 mg by mouth in the morning and at bedtime.   Voltaren  Arthritis Pain 1 % Gel Generic drug: diclofenac  Sodium Apply topically 4 (four) times daily.   Zinc Sulfate 66 MG Tabs        Allergies:  Allergies  Allergen Reactions   Metformin  And Related Diarrhea, Nausea Only and Other (See Comments)    dizzy, tired, chills   Cymbalta [Duloxetine Hcl] Swelling    Swelling in legs   Gabapentin Swelling    Swelling in legs   Silicone Hives, Itching, Dermatitis and Rash   Tramadol Swelling   Adhesive [Tape] Rash   Cefazolin Hives        Ciprofloxacin  Other (See Comments)    body aches    Clorazepate Dipotassium Other (See Comments)    Unknown reaction   Dilaudid  [Hydromorphone  Hcl] Itching     confused,   Doxycycline Rash   Enablex [Darifenacin Hydrobromide Er] Other (See Comments)    Hypotension, near syncope   Levofloxacin Other (See Comments)    Causes wrist pain   Lyrica [Pregabalin] Swelling    Swelling in legs   Methadone Hcl Other (See Comments)    Reaction unknown   Morphine  And Codeine Other (See Comments)    Confusion, constipation.    Talwin [Pentazocine] Other (See Comments)    climbing walls anxiety    Past Medical History, Surgical history, Social history, and Family History were reviewed and updated.  Review of Systems: All other 10 point review of systems is negative.   Physical Exam:  oral temperature is 97.6 F (36.4 C). Her blood pressure is 132/64 and her pulse is 74. Her respiration is 20 and oxygen  saturation is 100%.   Wt Readings from Last 3 Encounters:  08/28/24 162 lb 14.7 oz (73.9 kg)  02/18/24 172 lb 2 oz (78.1 kg)  10/29/23 156 lb 1.4 oz (70.8 kg)    Ocular: Sclerae unicteric, pupils equal, round and reactive to light Ear-nose-throat: Oropharynx clear, dentition fair Lymphatic: No  cervical or supraclavicular adenopathy Lungs no rales or rhonchi, good excursion bilaterally Heart regular rate and rhythm, no murmur appreciated Abd soft, nontender, positive bowel sounds MSK no focal spinal tenderness, no joint edema Neuro: non-focal, well-oriented, appropriate affect Breasts: Deferred   Lab Results  Component Value Date   WBC 4.6 09/01/2024   HGB 9.7 (L) 09/01/2024   HCT 31.3 (L) 09/01/2024   MCV 98.4 09/01/2024   PLT 121 (L) 09/01/2024   Lab Results  Component Value Date   FERRITIN 20 06/16/2024   IRON 57 06/16/2024  TIBC 263 06/16/2024   UIBC 206 06/16/2024   IRONPCTSAT 22 06/16/2024   Lab Results  Component Value Date   RETICCTPCT 1.1 09/01/2024   RBC 3.20 (L) 09/01/2024   No results found for: KPAFRELGTCHN, LAMBDASER, KAPLAMBRATIO No results found for: IGGSERUM, IGA, IGMSERUM No results found for: STEPHANY CARLOTA BENSON MARKEL EARLA JOANNIE DOC VICK, SPEI   Chemistry      Component Value Date/Time   NA 139 08/28/2024 0633   NA 140 09/03/2020 0000   K 4.6 08/28/2024 0633   CL 117 (H) 08/28/2024 0633   CO2 17 (L) 08/28/2024 0633   BUN 35 (H) 08/28/2024 0633   BUN 17 09/03/2020 0000   CREATININE 2.38 (H) 08/28/2024 0633   CREATININE 1.31 (H) 06/16/2024 1320   CREATININE 1.12 (H) 06/22/2023 1515   GLU 146 09/03/2020 0000      Component Value Date/Time   CALCIUM  7.5 (L) 08/28/2024 0633   ALKPHOS 43 08/27/2024 0608   AST 19 08/27/2024 0608   AST 19 06/16/2024 1320   ALT 8 08/27/2024 0608   ALT 11 06/16/2024 1320   BILITOT 0.2 08/27/2024 0608   BILITOT 0.3 06/16/2024 1320       Impression and Plan: [name] is a very pleasant 89 yo caucasian female with iron deficiency anemia secondary to GI blood loss and mild thrombocytopenia.   CBC counts remain stable at this time.  Iron studies are pending. We will replace if needed.  Follow-up in another 2 months.   Lauraine Pepper, NP 11/10/20251:31  PM

## 2024-09-02 ENCOUNTER — Ambulatory Visit: Payer: Self-pay

## 2024-09-02 LAB — ERYTHROPOIETIN: Erythropoietin: 18 m[IU]/mL (ref 2.6–18.5)

## 2024-09-02 NOTE — Telephone Encounter (Addendum)
 FYI Only or Action Required?: FYI only for provider: appointment scheduled on 09/04/2024 and going to ED for possibe UTI.   Patient was last seen in primary care on 05/27/2024 by Amon Aloysius BRAVO, MD.  Called Nurse Triage reporting Neck Pain and Dysuria.  Symptoms began today.  Interventions attempted: Nothing.  Symptoms are: unchanged.  Triage Disposition: See HCP Within 4 Hours (Or PCP Triage)  Patient/caregiver understands and will follow disposition?: Yes  Copied from CRM #[id number]. Topic: Clinical - Red Word Triage >> Sep 02, 2024 10:06 AM Terri MATSU wrote: Red Word that prompted transfer to Nurse Triage: Boby (her daughter) is calling regarding her mother. She stated she is having severe pain in the back of her neck and she feels pressure in her stomach.   CAL notified, no appointment available until 11/13 Reason for Disposition  Side (flank) or lower back pain present  Answer Assessment - Initial Assessment Questions 1. ONSET: When did the pain begin?      One day ago 2. LOCATION: Where does it hurt?      Right neck pain 3. PATTERN Does the pain come and go, or has it been constant since it started?      intermittent 4. SEVERITY: How bad is the pain?  (Scale 0-10; or none or slight stiffness, mild, moderate, severe)     severe 5. RADIATION: Does the pain go anywhere else, shoot into your arms?     Does not radiate 6. CORD SYMPTOMS: Any weakness or numbness of the arms or legs?     denies 7. CAUSE: What do you think is causing the neck pain?     unknown 8. NECK OVERUSE: Any recent activities that involved turning or twisting the neck?     denies 9. OTHER SYMPTOMS: Do you have any other symptoms? (e.g., headache, fever, chest pain, difficulty breathing, neck swelling)     denies  Answer Assessment - Initial Assessment Questions Patient recently d/c'd from hospital for UTI, experiencing possible reoccurence  1. SYMPTOM: What's the main symptom you're  concerned about? (e.g., frequency, incontinence)     Abdominal discomfort 2. ONSET: When did the  symptoms  start?     today 3. PAIN: Is there any pain? If Yes, ask: How bad is it? (Scale: 1-10; mild, moderate, severe)     Abdominal tenderness 4. CAUSE: What do you think is causing the symptoms?     Recurrent UTI 5. OTHER SYMPTOMS: Do you have any other symptoms? (e.g., blood in urine, fever, flank pain, pain with urination)     denies 6. PREGNANCY: Is there any chance you are pregnant? When was your last menstrual period?     N/a  Protocols used: Neck Pain or Stiffness-A-AH, Urinary Symptoms-A-AH

## 2024-09-02 NOTE — Telephone Encounter (Signed)
Pt going to ED.  

## 2024-09-03 NOTE — Progress Notes (Signed)
  Healthcare at [facility] [address] [place], [place] [address] [phone]  Date:  09/04/2024   Name:  [name]   DOB:  [birth year]   MRN:  [id number]  PCP:  Amon Aloysius BRAVO, MD    Chief Complaint: Hospitalization Follow-up (Abdominal pain over the weekend/Pts daughter would like to discuss a long term antibiotic /)   History of Present Illness:  [name] is a 89 y.o. very pleasant female patient who presents with the following:  Primary patient of  my partner Dr.Paz seen today with concern of hospital follow-up for UTI, metabolic encephalopathy, acute on chronic renal failure. Seen today with daughter and grandson  She has history of CAD, CHF, hypertension, PVD, diabetes, dementia, history of foot osteomyelitis, chronic renal failure She has status post bypass surgery in 2017, she also had a right BKA , amputation of toes left foot and a partial colectomy She was in the ER in August with UTI, in the ER again in September with renal failure, and then was admitted earlier this month 11/3 through 11/6; Acute metabolic encephalopathy, POA Klebsiella oxytoca UTI Patient presenting with confusion, painful urination.  Similar symptoms in the past 2 recurrent UTI.  Review of EMR notable for previous E. coli, Klebsiella, Enterococcus UTI.  Urinalysis consistent with UTI.  Ammonia level within normal limits.  CT head without contrast unrevealing.  Received dose of fosfomycin in the ED. started on IV ceftriaxone, transition to Cipro  on discharge complete 5-day course. Acute renal failure on CKD stage IIIa Baseline creatinine 1.1-1.4.  Presenting with elevated creatinine of 2.95.  No hydronephrosis on CT abdomen/pelvis. Etiology likely secondary to prerenal azotemia in setting of poor oral intake in the days preceding hospitalization.  Patient was started on IV fluid hydration with improvement of creatinine to 2.3 as of discharge.  Continue  to encourage increased oral intake.  Recommend repeat BMP 1 week to reassess kidney function.   HTN Carvedilol  12.5 mg p.o. twice daily DM2 Diet controlled at baseline HLD Atorvastatin  40 m p.o. daily  Hx PE Xarelto  2.5 mg p.o. twice daily CAD s/p CABG/stent Plavix  75 mg p.o. daily, atorvastatin  40 mg p.o. daily  Dementia Aricept  10 mg p.o. nightly   Inpatient team recommends follow-up BMP to monitor kidney function She was discharged home with Cipro - this is finished   Discussed the use of AI scribe software for clinical note transcription with the patient, who gave verbal consent to proceed.  History of Present Illness [name] is an 89 year old female who presents with confusion following a recent hospitalization for a urinary tract infection.  She was recently hospitalized for a urinary tract infection and was discharged about a week ago. During her illness, she experienced confusion and dehydration due to inadequate fluid intake and reluctance to eat. Her kidney function was affected, with a creatinine level that rose to 3.0 but improved to 2.3 by the time of her last blood work on the 10th. She completed a course of ciprofloxacin  for the UTI.  Since discharge, she remains more confused than her baseline, although there has been some improvement. Her baseline includes some level of confusion. She has a history of mild diabetes. Her blood pressure was noted to be okay during her recent illness.  She has a history of frequent urinary tract infections and was previously on a long-term antibiotic as a preventive measure, which was discontinued, possibly due to her  kidney function. She has been treated with methenamine  in the past to acidify the urine and prevent bacterial growth.  Seen today with her daughter but she most often lives with her son. Her daughter is trying to treat a pressure ulcer on her left heel. The wound has become deeper and darker, and she has been using Manuka  honey and compression hose for care. She is trying to increase her protein intake to aid healing.   Patient Active Problem List   Diagnosis Date Noted   AKI (acute kidney injury) 08/26/2024   Bilateral shoulder pain 11/20/2022   Bilateral hand pain 11/20/2022   IDA (iron deficiency anemia) 04/11/2022   Dementia (HCC) 10/30/2021   Chronic migraine without aura, with intractable migraine, so stated, with status migrainosus 08/17/2021   Cervical myelopathy (HCC) 11/29/2020   Chronic idiopathic constipation 11/12/2020   Lumbar compression fracture (HCC) 09/29/2020   Other spondylosis with radiculopathy, cervical region 09/29/2020   External hemorrhoids 02/15/2018   History of three vessel coronary artery bypass - 2017 , in [facility] 09/25/2017   PVD (peripheral vascular disease) (HCC)-- stent L leg 06-2017 Dr Dedra [facility] 07/23/2017   Phantom pain 04/01/2017   Constipation, chronic 04/01/2017   S/P unilateral BKA (below knee amputation), right (HCC) 12/14/2016   Toe osteomyelitis, left (HCC) 11/17/2016   Acute on chronic diastolic CHF (congestive heart failure), NYHA class 3 (HCC) 03/30/2016   Coronary artery disease due to lipid rich plaque 03/30/2016   Acute renal failure superimposed on stage 3 chronic kidney disease (HCC) 03/30/2016   S/P below knee amputation (HCC) 02/18/2016   Memory loss 11/26/2015   PCP NOTES >>>>>>>>>>>>>>>>>>>> 10/18/2015   S/P partial colectomy 01/08/2015   Diarrhea 01/29/2014   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 01/28/2014   Anemia 01/18/2014   CAD (coronary artery disease)    Carcinoma in situ in a polyp    Depression 09/17/2013   Charcot's joint 08/21/2013   Foot osteomyelitis, right (HCC) 07/16/2013   Chronic pain associated with significant psychosocial dysfunction 05/06/2013   Neuropathy    Hyperthyroidism    Lower extremity edema 03/08/2011   Annual physical exam 03/08/2011   Tachycardia 12/07/2010   ALLERGIC RHINITIS 04/15/2010    ORTHOSTATIC DIZZINESS 04/13/2010   HIATAL HERNIA 12/06/2009   GASTRIC ULCER, HX OF 12/06/2009   DYSPNEA 03/18/2009   DEGENERATIVE JOINT DISEASE 06/03/2008   DIVERTICULOSIS, COLON 01/27/2008   IRRITABLE BOWEL SYNDROME, HX OF 01/27/2008   Pulmonary nodule, right 01/22/2008   Hyperlipidemia 12/30/2007   Osteoporosis 12/30/2007   TB SKIN TEST, POSITIVE 08/09/2007   DM type 2 with diabetic peripheral neuropathy (HCC) 05/09/2007   Reflex sympathetic dystrophy-- PAIN MNGMT  05/09/2007   Essential hypertension 04/15/2007   GERD 04/15/2007    Past Medical History:  Diagnosis Date   Allergic rhinitis    Arthritis    back; ankles; hands; knees (10/31/2013)   Bilateral sensorineural hearing loss    CAD (coronary artery disease)    a. Nonobst in 2011. b. Abnormal nuc 09/2013 -> s/p cutting balloon to D2, mild LAD disease; c. 08/2015 MV: no ischemia, EF 71%.   Carcinoma in situ in a polyp 1994   a. 1994 - malignant polyp removed during colonoscopy.   Chronic diastolic CHF (congestive heart failure) (HCC)    a. 09/2013 Echo: EF 60-65%, mild LVH, Gr1 DD.   Dementia (HCC)    Diverticulosis    DJD (degenerative joint disease)    Fatty liver    GERD (gastroesophageal  reflux disease)    a. Hx GERD/esophageal dysmotility followed by Dr. Obie.    Hemorrhoids    Hiatal hernia    a. s/p Nissen fundoplication 2011.   Hyperlipidemia    a. patient unwilling to use statins.   Hypertensive heart disease    IBS (irritable bowel syndrome)    Macular degeneration 03/2009   Dr. Nicholaus   Neuropathy    a. Hands, feet, legs.   Orthostatic hypotension    Osteomyelitis (HCC)    a. 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 01/2016 s/p RLE transtibial amputation 2/2 Charcot rocker-bottom deformity and insensate neuropathy ulceration.   Osteoporosis    PE (pulmonary embolism) 12/2007   a. PE/DVT after neck surgery 2009. b. coumadin  d/c 10-2008.    PONV (postoperative nausea and vomiting) 2009   neck surgery   PPD positive    Recurrent UTI    RSD (reflex sympathetic dystrophy)    a. Chronic pain.   Spinal stenosis    Type II diabetes mellitus (HCC)    no on medication   Venous insufficiency    a. Contributing to LEE.   Wound of left foot     Past Surgical History:  Procedure Laterality Date   AMPUTATION  03/06/2012   Procedure: AMPUTATION FOOT;  Surgeon: Jerona LULLA Sage, MD;  Location: [facility] OR;  Service: Orthopedics;  Laterality: Left;  FIFTH RAY AMPUTATION    AMPUTATION Right 02/18/2016   Procedure: AMPUTATION BELOW KNEE;  Surgeon: Jerona Sage LULLA, MD;  Location: [facility] OR;  Service: Orthopedics;  Laterality: Right;   AMPUTATION Left 11/22/2016   Procedure: Amputation 4th Toe Left Foot at Metatarsophalangeal Joint;  Surgeon: Jerona Sage LULLA, MD;  Location: [facility] OR;  Service: Orthopedics;  Laterality: Left;   ANKLE FUSION  09/27/2012   Procedure: ANKLE FUSION;  Surgeon: Jerona LULLA Sage, MD;  Location: [facility] OR;  Service: Orthopedics;  Laterality: Left;  Left Tibiocalcaneal Fusion   ANKLE FUSION Right 05/09/2013   Procedure: ANKLE FUSION;  Surgeon: Jerona LULLA Sage, MD;  Location: [facility] OR;  Service: Orthopedics;  Laterality: Right;  Excision Osteomyelitis Base 1st MT Right Foot, Fusion Medial Column   ANTERIOR CERVICAL DECOMP/DISCECTOMY FUSION  12/18/07   For OA,  Dr. Barbarann:  fu by a PE   ANTERIOR CERVICAL DECOMP/DISCECTOMY FUSION N/A 11/29/2020   Procedure: Anterior Cervical Decompression Fusion - Cervical Three-Cerivcal Four;  Surgeon: Louis Shove, MD;  Location: [facility] OR;  Service: Neurosurgery;  Laterality: N/A;  3C   CARDIAC CATHETERIZATION  05/2010    at [facility] TUNNEL RELEASE Left 09/2011   CATARACT EXTRACTION W/ INTRAOCULAR LENS  IMPLANT, BILATERAL  2004   feb 2004 left, aug 2004 right   CHOLECYSTECTOMY  03/2002   CORONARY ANGIOPLASTY  10/31/2013   CORONARY ARTERY BYPASS GRAFT  09/27/2016   CYST REMOVAL HAND  06/2003   FOOT BONE EXCISION  Right 06/2009   LAPAROSCOPIC RIGHT HEMI COLECTOMY N/A 01/08/2015   Procedure: LAPAROSCOPIC ASSISTED RIGHT HEMI COLECTOMY;  Surgeon: Donnice Lunger, MD;  Location: [facility] ORS;  Service: General;  Laterality: N/A;   LEFT HEART CATHETERIZATION WITH CORONARY ANGIOGRAM N/A 10/31/2013   Procedure: LEFT HEART CATHETERIZATION WITH CORONARY ANGIOGRAM;  Surgeon: Candyce GORMAN Reek, MD;  Location: [facility] CATH LAB;  Service: Cardiovascular;  Laterality: N/A;   NASAL SEPTUM SURGERY  09/1963   NISSEN FUNDOPLICATION  01/25/2010   PERCUTANEOUS CORONARY INTERVENTION-BALLOON ONLY  10/31/2013   Procedure: PERCUTANEOUS CORONARY INTERVENTION-BALLOON  ONLY;  Surgeon: Candyce GORMAN Reek, MD;  Location: [facility] CATH LAB;  Service: Cardiovascular;;   ROTATOR CUFF REPAIR Right 07/2007   Dr. Beverley FRIED CUFF REPAIR Left 11/2004   stent in left leg, behind knee  06/27/2017   x2, done [facility]   TOE AMPUTATION Right 08/2008   3rd toe, Dr. Harden due to osteomyelitis   VAGINAL HYSTERECTOMY  03/1975   VEIN LIGATION Bilateral 03/1966   VENA CAVA FILTER PLACEMENT  01/2010   green filter; due to blood clots   WISDOM TOOTH EXTRACTION  06/2007    Social History   Tobacco Use   Smoking status: Never   Smokeless tobacco: Never   Tobacco comments:    tried for a few months in college.   Vaping Use   Vaping status: Never Used  Substance Use Topics   Alcohol  use: No    Alcohol /week: 0.0 standard drinks of alcohol    Drug use: No    Family History  Problem Relation Age of Onset   Heart disease Mother        mitral valve replaced   Arthritis Mother    Diabetic kidney disease Daughter    Diabetes Father    Stroke Father    Migraines Sister    Hypertension Other    Breast cancer Other    Colon cancer Neg Hx    Esophageal cancer Neg Hx    Stomach cancer Neg Hx    Rectal cancer Neg Hx     Allergies  Allergen Reactions   Metformin  And Related Diarrhea, Nausea Only and Other (See Comments)     dizzy, tired, chills   Cymbalta [Duloxetine Hcl] Swelling    Swelling in legs   Gabapentin Swelling    Swelling in legs   Silicone Hives, Itching, Dermatitis and Rash   Tramadol Swelling   Adhesive [Tape] Rash   Cefazolin Hives        Ciprofloxacin  Other (See Comments)    body aches    Clorazepate Dipotassium Other (See Comments)    Unknown reaction   Dilaudid  [Hydromorphone  Hcl] Itching     confused,   Doxycycline Rash   Enablex [Darifenacin Hydrobromide Er] Other (See Comments)    Hypotension, near syncope   Levofloxacin Other (See Comments)    Causes wrist pain   Lyrica [Pregabalin] Swelling    Swelling in legs   Methadone Hcl Other (See Comments)    Reaction unknown   Morphine  And Codeine Other (See Comments)    Confusion, constipation.    Talwin [Pentazocine] Other (See Comments)    climbing walls anxiety    Medication list has been reviewed and updated.  Current Outpatient Medications on File Prior to Visit  Medication Sig Dispense Refill   Alpha-Lipoic Acid 600 MG CAPS Take 1,200 mg by mouth daily.     amitriptyline  (ELAVIL ) 25 MG tablet Take 1 tablet (25 mg total) by mouth at bedtime. 90 tablet 1   Ascorbic Acid  (VITAMIN C WITH ROSE HIPS) 500 MG tablet Take 500 mg by mouth in the morning and at bedtime.     atorvastatin  (LIPITOR) 40 MG tablet Take 40 mg by mouth daily.     BERBERINE CHLORIDE PO Take by mouth. 1 every morning, 2 at bedtime. OTC     calcium  carbonate (TUMS EX) 750 MG chewable tablet Chew 4 tablets by mouth daily.     carvedilol  (COREG ) 12.5 MG tablet Take 12.5 mg by mouth 2 (two)  times daily with a meal.     CINNAMON PO Take 1 tablet by mouth daily.     clopidogrel  (PLAVIX ) 75 MG tablet Take 1 tablet (75 mg total) by mouth at bedtime. Restart on 12/04/2020     Cranberry 200 MG CAPS Take 2 capsules by mouth daily. *36 mg extract*     diclofenac  Sodium (VOLTAREN  ARTHRITIS PAIN) 1 % GEL Apply topically 4 (four) times daily.     dicyclomine   (BENTYL ) 10 MG capsule TAKE 1 CAPSULE BY MOUTH THREE TIMES DAILY BEFORE MEALS 270 capsule 2   donepezil  (ARICEPT ) 10 MG tablet Take 1 tablet (10 mg total) by mouth at bedtime. 90 tablet 3   naloxone  (NARCAN ) nasal spray 4 mg/0.1 mL Place 1 spray into the nose once as needed for up to 1 dose. 2 each 0   nitroGLYCERIN  (NITROSTAT ) 0.4 MG SL tablet Place 1 tablet (0.4 mg total) under the tongue every 5 (five) minutes x 3 doses as needed for chest pain. 25 tablet 3   ondansetron  (ZOFRAN ) 4 MG tablet Take 1 tablet (4 mg total) by mouth every 8 (eight) hours as needed for nausea or vomiting. 40 tablet 1   Probiotic Product (PROBIOTIC PEARLS PO) Take 1 tablet by mouth daily.     rivaroxaban  (XARELTO ) 2.5 MG TABS tablet Take 1 tablet (2.5 mg total) by mouth 2 (two) times daily. Restart on 12/04/2020 60 tablet    Zinc Sulfate 66 MG TABS      collagenase (SANTYL) 250 UNIT/GM ointment Apply topically daily. (Patient not taking: Reported on 09/04/2024) 30 g 0   pramipexole  (MIRAPEX ) 0.125 MG tablet Take 1-2 tablets (0.125-0.25 mg total) by mouth at bedtime as needed. (Patient not taking: Reported on 09/04/2024) 180 tablet 1   Current Facility-Administered Medications on File Prior to Visit  Medication Dose Route Frequency Provider Last Rate Last Admin   [START ON 12/28/2024] denosumab  (PROLIA ) injection 60 mg  60 mg Subcutaneous Q6 months Paz, Jose E, MD        Review of Systems:  As per HPI- otherwise negative.   Physical Examination: Vitals:   09/04/24 1442  BP: 112/72  Pulse: 75  SpO2: 100%   Vitals:   09/04/24 1442  Height: 5' 1 (1.549 m)   Body mass index is 30.78 kg/m. Ideal Body Weight: Weight in (lb) to have BMI = 25: 132  GEN: no acute distress. Well appearing elderly lady, no distress HEENT: Atraumatic, Normocephalic.  Ears and Nose: No external deformity. CV: RRR, No M/G/R. No JVD. No thrill. No extra heart sounds. PULM: CTA B, no wheezes, crackles, rhonchi. No retractions. No  resp. distress. No accessory muscle use. ABD: S, NT, ND, +BS. No rebound. No HSM. EXTR: No c/c/e PSYCH: Normally interactive. Conversant.  S/p right BKA Left amputation of 2 toes, stage 3 pressure ulcer on heel    Assessment and Plan: Acute UTI - Plan: Urine Culture, CANCELED: Urine Culture  Pressure ulcer, heel, left, unstageable (HCC) - Plan: Ambulatory referral to Podiatry  Acute renal insufficiency - Plan: Basic metabolic panel with GFR  Assessment & Plan Urinary tract infection Recent UTI treated with ciprofloxacin . Concerns about recurrent UTIs due to chronic kidney disease. Most recent urine culture shows Klebsiella sensitive to cephalexin. - Repeated urine culture to ensure infection clearance. -have tried to consult urology for recommendations on long-term prophylaxis. Unable to reach on phone, sent message through epic  Pressure ulcer of left heel Pressure ulcer has worsened, becoming larger. Current management includes  compression hose and Manuka honey. No debridement recommended at this time. - Referred to podiatry for evaluation and potential debridement. - Use hydrocolloid gel dressings to maintain appropriate moisture levels. - Increase protein intake to support healing.  Chronic kidney disease Recent exacerbation likely due to dehydration. Creatinine improved but not at baseline. Monitoring kidney function is crucial due to potential impact on antibiotic choices. - Ordered basic metabolic panel to assess kidney function. - Will consider rechecking kidney function next week if needed.  Type 2 diabetes mellitus Mild type 2 diabetes mellitus. - Continue current management and monitoring.  Signed Harlene Schroeder, MD  Received labs 11/14- message to mychart Results for orders placed or performed in visit on 09/04/24  Basic metabolic panel with GFR   Collection Time: 09/04/24  3:20 PM  Result Value Ref Range   Sodium 140 135 - 145 mEq/L   Potassium 3.8 3.5 - 5.1  mEq/L   Chloride 111 96 - 112 mEq/L   CO2 21 19 - 32 mEq/L   Glucose, Bld 99 70 - 99 mg/dL   BUN 46 (H) 6 - 23 mg/dL   Creatinine, Ser 7.48 (H) 0.40 - 1.20 mg/dL   GFR 83.17 (L) >39.99 mL/min   Calcium  8.4 8.4 - 10.5 mg/dL   *Note: Due to a large number of results and/or encounters for the requested time period, some results have not been displayed. A complete set of results can be found in Results Review.

## 2024-09-04 ENCOUNTER — Ambulatory Visit (INDEPENDENT_AMBULATORY_CARE_PROVIDER_SITE_OTHER): Admitting: Family Medicine

## 2024-09-04 ENCOUNTER — Encounter: Payer: Self-pay | Admitting: Family Medicine

## 2024-09-04 VITALS — BP 112/72 | HR 75 | Ht 61.0 in

## 2024-09-04 DIAGNOSIS — N390 Urinary tract infection, site not specified: Secondary | ICD-10-CM

## 2024-09-04 DIAGNOSIS — N289 Disorder of kidney and ureter, unspecified: Secondary | ICD-10-CM | POA: Diagnosis not present

## 2024-09-04 DIAGNOSIS — L89620 Pressure ulcer of left heel, unstageable: Secondary | ICD-10-CM | POA: Diagnosis not present

## 2024-09-04 NOTE — Patient Instructions (Signed)
 Good to see you today!  We will check on your kidney function today Please bring back a urine sample for culture when you can I will touch base with urology about a longer term abx for UTI prevention  Please call podiatry asap to get an appt for wound check

## 2024-09-05 ENCOUNTER — Other Ambulatory Visit: Payer: Self-pay | Admitting: Gastroenterology

## 2024-09-05 ENCOUNTER — Encounter: Payer: Self-pay | Admitting: Family Medicine

## 2024-09-05 ENCOUNTER — Other Ambulatory Visit: Payer: Self-pay | Admitting: Internal Medicine

## 2024-09-05 ENCOUNTER — Other Ambulatory Visit: Payer: Self-pay | Admitting: Lab

## 2024-09-05 ENCOUNTER — Ambulatory Visit: Admitting: Podiatry

## 2024-09-05 DIAGNOSIS — N289 Disorder of kidney and ureter, unspecified: Secondary | ICD-10-CM

## 2024-09-05 DIAGNOSIS — L89622 Pressure ulcer of left heel, stage 2: Secondary | ICD-10-CM | POA: Diagnosis not present

## 2024-09-05 LAB — BASIC METABOLIC PANEL WITH GFR
BUN: 46 mg/dL — ABNORMAL HIGH (ref 6–23)
CO2: 21 meq/L (ref 19–32)
Calcium: 8.4 mg/dL (ref 8.4–10.5)
Chloride: 111 meq/L (ref 96–112)
Creatinine, Ser: 2.51 mg/dL — ABNORMAL HIGH (ref 0.40–1.20)
GFR: 16.82 mL/min — ABNORMAL LOW (ref >60.00)
Glucose, Bld: 99 mg/dL (ref 70–99)
Potassium: 3.8 meq/L (ref 3.5–5.1)
Sodium: 140 meq/L (ref 135–145)

## 2024-09-05 MED ORDER — SANTYL 250 UNIT/GM EX OINT: 1.0000 | TOPICAL_OINTMENT | Freq: Every day | CUTANEOUS | 0 refills | Status: AC

## 2024-09-05 MED ORDER — SANTYL 250 UNIT/GM EX OINT: 1.0000 | TOPICAL_OINTMENT | Freq: Every day | CUTANEOUS | 0 refills | Status: DC

## 2024-09-05 NOTE — Progress Notes (Signed)
 Subjective:  Patient ID: [name], female    DOB: 22-Oct-1935,  MRN: [id number]  Chief Complaint  Patient presents with   Foot Ulcer    Left foot heel wound     89 y.o. female presents with the above complaint.  Patient presents with left heel eschar/ulceration from pressure.  She states she has been dealing this for quite some time.  She has tried other medication none of which has helped.  She lives in [place]  she is here visiting and wanted to get it evaluated pain scale 7 out of 10 dull aching nature hurts with ambulation is with pressure.   Review of Systems: Negative except as noted in the HPI. Denies N/V/F/Ch.  Past Medical History:  Diagnosis Date   Allergic rhinitis    Arthritis    back; ankles; hands; knees (10/31/2013)   Bilateral sensorineural hearing loss    CAD (coronary artery disease)    a. Nonobst in 2011. b. Abnormal nuc 09/2013 -> s/p cutting balloon to D2, mild LAD disease; c. 08/2015 MV: no ischemia, EF 71%.   Carcinoma in situ in a polyp 1994   a. 1994 - malignant polyp removed during colonoscopy.   Chronic diastolic CHF (congestive heart failure) (HCC)    a. 09/2013 Echo: EF 60-65%, mild LVH, Gr1 DD.   Dementia (HCC)    Diverticulosis    DJD (degenerative joint disease)    Fatty liver    GERD (gastroesophageal reflux disease)    a. Hx GERD/esophageal dysmotility followed by Dr. Obie.    Hemorrhoids    Hiatal hernia    a. s/p Nissen fundoplication 2011.   Hyperlipidemia    a. patient unwilling to use statins.   Hypertensive heart disease    IBS (irritable bowel syndrome)    Macular degeneration 03/2009   Dr. Nicholaus   Neuropathy    a. Hands, feet, legs.   Orthostatic hypotension    Osteomyelitis (HCC)    a. 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 01/2016 s/p RLE transtibial amputation 2/2 Charcot rocker-bottom deformity and insensate neuropathy ulceration.   Osteoporosis    PE  (pulmonary embolism) 12/2007   a. PE/DVT after neck surgery 2009. b. coumadin  d/c 10-2008.   PONV (postoperative nausea and vomiting) 2009   neck surgery   PPD positive    Recurrent UTI    RSD (reflex sympathetic dystrophy)    a. Chronic pain.   Spinal stenosis    Type II diabetes mellitus (HCC)    no on medication   Venous insufficiency    a. Contributing to LEE.   Wound of left foot     Current Outpatient Medications:    Alpha-Lipoic Acid 600 MG CAPS, Take 1,200 mg by mouth daily., Disp: , Rfl:    amitriptyline  (ELAVIL ) 25 MG tablet, Take 1 tablet (25 mg total) by mouth at bedtime., Disp: 90 tablet, Rfl: 1   Ascorbic Acid  (VITAMIN C WITH ROSE HIPS) 500 MG tablet, Take 500 mg by mouth in the morning and at bedtime., Disp: , Rfl:    atorvastatin  (LIPITOR) 40 MG tablet, Take 40 mg by mouth daily., Disp: , Rfl:    BERBERINE CHLORIDE PO, Take by mouth. 1 every morning, 2 at bedtime. OTC, Disp: , Rfl:    calcium  carbonate (TUMS EX) 750 MG chewable tablet, Chew 4 tablets by mouth daily., Disp: , Rfl:    carvedilol  (COREG ) 12.5 MG tablet, Take 12.5 mg by mouth  2 (two) times daily with a meal., Disp: , Rfl:    CINNAMON PO, Take 1 tablet by mouth daily., Disp: , Rfl:    clopidogrel  (PLAVIX ) 75 MG tablet, Take 1 tablet (75 mg total) by mouth at bedtime. Restart on 12/04/2020, Disp: , Rfl:    collagenase  (SANTYL ) 250 UNIT/GM ointment, Apply 1 Application topically daily., Disp: 15 g, Rfl: 0   collagenase  (SANTYL ) 250 UNIT/GM ointment, Apply 1 Application topically daily., Disp: 45 g, Rfl: 0   Cranberry 200 MG CAPS, Take 2 capsules by mouth daily. *36 mg extract*, Disp: , Rfl:    diclofenac  Sodium (VOLTAREN  ARTHRITIS PAIN) 1 % GEL, Apply topically 4 (four) times daily., Disp: , Rfl:    dicyclomine  (BENTYL ) 10 MG capsule, Take 1 capsule (10 mg total) by mouth 2 (two) times daily as needed for spasms., Disp: 60 capsule, Rfl: 2   donepezil  (ARICEPT ) 10 MG tablet, Take 1 tablet (10 mg total) by mouth  at bedtime., Disp: 90 tablet, Rfl: 1   estradiol  (ESTRACE ) 0.01 % CREA vaginal cream, Apply one gram of cream vaginally 2-3 x a week, Disp: 42.5 g, Rfl: 1   HYDROcodone -acetaminophen  (NORCO) 10-325 MG tablet, Take 1 tablet by mouth every 6 (six) hours as needed., Disp: 120 tablet, Rfl: 0   naloxone  (NARCAN ) nasal spray 4 mg/0.1 mL, Place 1 spray into the nose once as needed for up to 1 dose., Disp: 2 each, Rfl: 0   nitroGLYCERIN  (NITROSTAT ) 0.4 MG SL tablet, Place 1 tablet (0.4 mg total) under the tongue every 5 (five) minutes x 3 doses as needed for chest pain., Disp: 25 tablet, Rfl: 3   omeprazole  (PRILOSEC) 20 MG capsule, Take 1 capsule (20 mg total) by mouth 2 (two) times daily as needed for up to 14 days., Disp: 90 capsule, Rfl: 3   ondansetron  (ZOFRAN ) 4 MG tablet, Take 1 tablet (4 mg total) by mouth every 8 (eight) hours as needed for nausea or vomiting., Disp: 40 tablet, Rfl: 1   pramipexole  (MIRAPEX ) 0.125 MG tablet, Take 1-2 tablets (0.125-0.25 mg total) by mouth at bedtime as needed. (Patient not taking: Reported on 09/04/2024), Disp: 180 tablet, Rfl: 1   Probiotic Product (PROBIOTIC PEARLS PO), Take 1 tablet by mouth daily., Disp: , Rfl:    rivaroxaban  (XARELTO ) 2.5 MG TABS tablet, Take 1 tablet (2.5 mg total) by mouth 2 (two) times daily. Restart on 12/04/2020, Disp: 60 tablet, Rfl:    Zinc Sulfate 66 MG TABS, , Disp: , Rfl:   Current Facility-Administered Medications:    [START ON 12/28/2024] denosumab  (PROLIA ) injection 60 mg, 60 mg, Subcutaneous, Q6 months, Paz, Aloysius BRAVO, MD  Social History   Tobacco Use  Smoking Status Never  Smokeless Tobacco Never  Tobacco Comments   tried for a few months in college.     Allergies  Allergen Reactions   Metformin  And Related Diarrhea, Nausea Only and Other (See Comments)    dizzy, tired, chills   Cymbalta [Duloxetine Hcl] Swelling    Swelling in legs   Gabapentin Swelling    Swelling in legs   Silicone Hives, Itching, Dermatitis and Rash    Tramadol Swelling   Adhesive [Tape] Rash   Cefazolin Hives        Ciprofloxacin  Other (See Comments)    body aches    Clorazepate Dipotassium Other (See Comments)    Unknown reaction   Dilaudid  [Hydromorphone  Hcl] Itching     confused,   Doxycycline Rash   Enablex [Darifenacin Hydrobromide  Er] Other (See Comments)    Hypotension, near syncope   Levofloxacin Other (See Comments)    Causes wrist pain   Lyrica [Pregabalin] Swelling    Swelling in legs   Methadone Hcl Other (See Comments)    Reaction unknown   Morphine  And Codeine Other (See Comments)    Confusion, constipation.    Talwin [Pentazocine] Other (See Comments)    climbing walls anxiety   Objective:  There were no vitals filed for this visit. There is no height or weight on file to calculate BMI. Constitutional Well developed. Well nourished.  Vascular Dorsalis pedis pulses palpable bilaterally. Posterior tibial pulses palpable bilaterally. Capillary refill normal to all digits.  No cyanosis or clubbing noted. Pedal hair growth normal.  Neurologic Normal speech. Oriented to person, place, and time. Epicritic sensation to light touch grossly present bilaterally.  Dermatologic Left heel plantar eschar noted with fat layer exposed.  No probing down to deep tissue or bone noted no purulent drainage noted no erythema noted.  Stable dry noted  Orthopedic: Normal joint ROM without pain or crepitus bilaterally. No visible deformities. No bony tenderness.   Radiographs: None Assessment:   1. Pressure injury of left heel, stage 2 (HCC)    Plan:  Patient was evaluated and treated and all questions answered.  Left heel pressure ulceration/eschar stable - All questions and concerns were discussed with the patient in extensive detail I discussed offloading with pillows.  She states understanding she is already doing that. - She would benefit from Santyl  wet-to-dry dressing sent to was sent to the pharmacy they  will apply once a day. - I discussed seeing a podiatrist in [place]  when she returns.  She states understanding will do so. - She is at high risk for undergoing major amputation given the location of the ulceration she states understanding  No follow-ups on file.  Left heel pressure ulcer fat layer exposed debridement here from [place]  implant returning back to [place]  Santyl  wet-to-dry dressing

## 2024-09-08 ENCOUNTER — Telehealth: Payer: Self-pay | Admitting: Lab

## 2024-09-08 ENCOUNTER — Telehealth: Payer: Self-pay | Admitting: Gastroenterology

## 2024-09-08 ENCOUNTER — Other Ambulatory Visit: Payer: Self-pay

## 2024-09-08 MED ORDER — HYDROCODONE-ACETAMINOPHEN 10-325 MG PO TABS: 1.0000 | ORAL_TABLET | Freq: Four times a day (QID) | ORAL | 0 refills | Status: DC | PRN

## 2024-09-08 MED ORDER — SANTYL 250 UNIT/GM EX OINT: 1.0000 | TOPICAL_OINTMENT | Freq: Every day | CUTANEOUS | 0 refills | Status: AC

## 2024-09-08 NOTE — Telephone Encounter (Signed)
 Pharmacy requesting refill on Santyl 250U/GM ointment 30GM

## 2024-09-08 NOTE — Telephone Encounter (Signed)
 Patient's son called requesting a refill for omeprazole  and dicyclomine . Please advise, thank you

## 2024-09-08 NOTE — Telephone Encounter (Signed)
 Copied from CRM #[id number]. Topic: Clinical - Prescription Issue >> Sep 08, 2024 10:21 AM Charolett L wrote: Reason for CRM: Patient son is requesting a call back because of medication HYDROcodone -acetaminophen  (NORCO/VICODIN) 5-325 MG per tablet 1 tablet is showing discontinued as of 08/26/24... He stated that that's the only medication that helps with the pain CB# [phone] He also stated that if sent to the pharmacy to send it to [id number] - [place], [place] - [address] AT [facility] OF [address] & [facility] [address]

## 2024-09-08 NOTE — Telephone Encounter (Signed)
 Requesting: hydrocodone  10-325mg  Contract:11/28/22 UDS: 02/18/24 Last Visit: 05/27/24 Next Visit: 11/10/24 Last Refill:  05/27/24 #120 and 0RF   Please Advise

## 2024-09-09 ENCOUNTER — Other Ambulatory Visit: Payer: Self-pay

## 2024-09-09 ENCOUNTER — Other Ambulatory Visit: Payer: Self-pay | Admitting: Gastroenterology

## 2024-09-09 DIAGNOSIS — R109 Unspecified abdominal pain: Secondary | ICD-10-CM

## 2024-09-09 MED ORDER — DICYCLOMINE HCL 10 MG PO CAPS: 10.0000 mg | ORAL_CAPSULE | Freq: Two times a day (BID) | ORAL | 2 refills | Status: DC | PRN

## 2024-09-09 MED ORDER — ESTRADIOL 0.01 % VA CREA: TOPICAL_CREAM | VAGINAL | 1 refills | Status: AC

## 2024-09-09 MED ORDER — DICYCLOMINE HCL 10 MG PO CAPS: 10.0000 mg | ORAL_CAPSULE | Freq: Two times a day (BID) | ORAL | 2 refills | Status: AC | PRN

## 2024-09-09 NOTE — Progress Notes (Signed)
 Refill dicyclomine  sent, limited script needs follow-up

## 2024-09-09 NOTE — Telephone Encounter (Signed)
 Ok to send Rx for Omeprazole  20mg  BID PRN X 90 days with 3 refills. Thanks

## 2024-09-09 NOTE — Addendum Note (Signed)
 Addended by: WATT RAISIN C on: 09/09/2024 06:55 AM   Modules accepted: Orders

## 2024-09-10 ENCOUNTER — Other Ambulatory Visit (INDEPENDENT_AMBULATORY_CARE_PROVIDER_SITE_OTHER)

## 2024-09-10 ENCOUNTER — Inpatient Hospital Stay

## 2024-09-10 VITALS — BP 105/75 | HR 90 | Temp 98.2°F

## 2024-09-10 DIAGNOSIS — D709 Neutropenia, unspecified: Secondary | ICD-10-CM | POA: Diagnosis not present

## 2024-09-10 DIAGNOSIS — N289 Disorder of kidney and ureter, unspecified: Secondary | ICD-10-CM

## 2024-09-10 DIAGNOSIS — D500 Iron deficiency anemia secondary to blood loss (chronic): Secondary | ICD-10-CM | POA: Diagnosis not present

## 2024-09-10 DIAGNOSIS — K922 Gastrointestinal hemorrhage, unspecified: Secondary | ICD-10-CM | POA: Diagnosis not present

## 2024-09-10 DIAGNOSIS — D509 Iron deficiency anemia, unspecified: Secondary | ICD-10-CM

## 2024-09-10 DIAGNOSIS — N390 Urinary tract infection, site not specified: Secondary | ICD-10-CM | POA: Diagnosis not present

## 2024-09-10 DIAGNOSIS — R410 Disorientation, unspecified: Secondary | ICD-10-CM | POA: Diagnosis not present

## 2024-09-10 DIAGNOSIS — D696 Thrombocytopenia, unspecified: Secondary | ICD-10-CM | POA: Diagnosis not present

## 2024-09-10 MED ORDER — SODIUM CHLORIDE 0.9 % IV SOLN: Freq: Once | INTRAVENOUS | Status: AC

## 2024-09-10 MED ORDER — SODIUM CHLORIDE 0.9 % IV SOLN
1000.0000 mg | Freq: Once | INTRAVENOUS | Status: AC
  Administered 2024-09-10: 1000 mg via INTRAVENOUS
  Filled 2024-09-10: qty 10

## 2024-09-11 ENCOUNTER — Other Ambulatory Visit: Payer: Self-pay

## 2024-09-11 ENCOUNTER — Other Ambulatory Visit: Payer: Self-pay | Admitting: Podiatry

## 2024-09-11 ENCOUNTER — Telehealth: Payer: Self-pay | Admitting: Family Medicine

## 2024-09-11 LAB — BASIC METABOLIC PANEL WITH GFR
BUN: 81 mg/dL — ABNORMAL HIGH (ref 6–23)
CO2: 18 meq/L — ABNORMAL LOW (ref 19–32)
Calcium: 8.2 mg/dL — ABNORMAL LOW (ref 8.4–10.5)
Chloride: 110 meq/L (ref 96–112)
Creatinine, Ser: 2.46 mg/dL — ABNORMAL HIGH (ref 0.40–1.20)
GFR: 17.23 mL/min — ABNORMAL LOW (ref >60.00)
Glucose, Bld: 135 mg/dL — ABNORMAL HIGH (ref 70–99)
Potassium: 4.3 meq/L (ref 3.5–5.1)
Sodium: 140 meq/L (ref 135–145)

## 2024-09-11 MED ORDER — OMEPRAZOLE 20 MG PO CPDR: 20.0000 mg | DELAYED_RELEASE_CAPSULE | Freq: Two times a day (BID) | ORAL | 3 refills | Status: AC | PRN

## 2024-09-11 NOTE — Telephone Encounter (Signed)
 Called and discussed with her son [name] who asked me to call her daughter [name] at  [phone]- [name] is with [name] right now.  [name] and we talked I am concerned that her renal numbers are not improving- it looks like she is more dehydrated even than before. She does not have a nephrologist at this time.  [name] notes her mother is confused, it is difficult to get her to take fluids.  She does seem comfortable but just does not want to eat or drink very much I advised that I am afraid she is suffering from acute kidney failure and will pass away in the next days or weeks without intervention in the form of IV fluids and possible admission.  However certainly intervention may be a short-term fix, patient does have multiple medical problems as well as dementia.  It would also be a reasonable choice for her family to keep her home and keep her comfortable  I asked [name] to please discuss these options with any other close family members who are involved with decision making.  She may end up taking her mother to the emergency department to get some fluids but at this point they likely will not have her admitted again.  I have called urology to touch base with her doc there await a call back

## 2024-09-12 ENCOUNTER — Encounter: Payer: Self-pay | Admitting: Family Medicine

## 2024-09-12 DIAGNOSIS — N179 Acute kidney failure, unspecified: Secondary | ICD-10-CM | POA: Diagnosis not present

## 2024-09-13 ENCOUNTER — Emergency Department (HOSPITAL_BASED_OUTPATIENT_CLINIC_OR_DEPARTMENT_OTHER)
Admission: EM | Admit: 2024-09-13 | Discharge: 2024-09-13 | Disposition: A | Discharge status: Home / Self Care | Source: Ambulatory Visit | Attending: Emergency Medicine | Admitting: Emergency Medicine

## 2024-09-13 ENCOUNTER — Encounter (HOSPITAL_BASED_OUTPATIENT_CLINIC_OR_DEPARTMENT_OTHER): Payer: Self-pay | Admitting: Emergency Medicine

## 2024-09-13 ENCOUNTER — Other Ambulatory Visit: Payer: Self-pay

## 2024-09-13 DIAGNOSIS — R300 Dysuria: Secondary | ICD-10-CM | POA: Diagnosis present

## 2024-09-13 DIAGNOSIS — N3000 Acute cystitis without hematuria: Secondary | ICD-10-CM | POA: Insufficient documentation

## 2024-09-13 DIAGNOSIS — E860 Dehydration: Secondary | ICD-10-CM | POA: Insufficient documentation

## 2024-09-13 LAB — URINALYSIS, ROUTINE W REFLEX MICROSCOPIC
Bilirubin Urine: NEGATIVE
Glucose, UA: NEGATIVE mg/dL
Hgb urine dipstick: NEGATIVE
Ketones, ur: NEGATIVE mg/dL
Nitrite: NEGATIVE
Protein, ur: NEGATIVE mg/dL
Specific Gravity, Urine: 1.007 (ref 1.005–1.030)
pH: 6 (ref 5.0–8.0)

## 2024-09-13 LAB — BASIC METABOLIC PANEL WITH GFR
Anion gap: 13 (ref 5–15)
BUN: 63 mg/dL — ABNORMAL HIGH (ref 8–23)
CO2: 16 mmol/L — ABNORMAL LOW (ref 22–32)
Calcium: 7.9 mg/dL — ABNORMAL LOW (ref 8.9–10.3)
Chloride: 112 mmol/L — ABNORMAL HIGH (ref 98–111)
Creatinine, Ser: 2.23 mg/dL — ABNORMAL HIGH (ref 0.44–1.00)
GFR, Estimated: 21 mL/min — ABNORMAL LOW (ref >60)
Glucose, Bld: 99 mg/dL (ref 70–99)
Potassium: 3.9 mmol/L (ref 3.5–5.1)
Sodium: 141 mmol/L (ref 135–145)

## 2024-09-13 LAB — CBC
HCT: 27.1 % — ABNORMAL LOW (ref 36.0–46.0)
Hemoglobin: 8.5 g/dL — ABNORMAL LOW (ref 12.0–15.0)
MCH: 30.8 pg (ref 26.0–34.0)
MCHC: 31.4 g/dL (ref 30.0–36.0)
MCV: 98.2 fL (ref 80.0–100.0)
Platelets: 137 K/uL — ABNORMAL LOW (ref 150–400)
RBC: 2.76 MIL/uL — ABNORMAL LOW (ref 3.87–5.11)
RDW: 14.8 % (ref 11.5–15.5)
WBC: 4.2 K/uL (ref 4.0–10.5)
nRBC: 0 % (ref 0.0–0.2)

## 2024-09-13 LAB — COMPREHENSIVE METABOLIC PANEL WITH GFR
ALT: 9 U/L (ref 0–44)
AST: 17 U/L (ref 15–41)
Albumin: 3.6 g/dL (ref 3.5–5.0)
Alkaline Phosphatase: 43 U/L (ref 38–126)
Anion gap: 12 (ref 5–15)
BUN: 66 mg/dL — ABNORMAL HIGH (ref 8–23)
CO2: 18 mmol/L — ABNORMAL LOW (ref 22–32)
Calcium: 8.6 mg/dL — ABNORMAL LOW (ref 8.9–10.3)
Chloride: 109 mmol/L (ref 98–111)
Creatinine, Ser: 2.35 mg/dL — ABNORMAL HIGH (ref 0.44–1.00)
GFR, Estimated: 19 mL/min — ABNORMAL LOW (ref >60)
Glucose, Bld: 104 mg/dL — ABNORMAL HIGH (ref 70–99)
Potassium: 4 mmol/L (ref 3.5–5.1)
Sodium: 139 mmol/L (ref 135–145)
Total Bilirubin: 0.2 mg/dL (ref 0.0–1.2)
Total Protein: 5.9 g/dL — ABNORMAL LOW (ref 6.5–8.1)

## 2024-09-13 MED ORDER — CIPROFLOXACIN HCL 500 MG PO TABS: 500.0000 mg | ORAL_TABLET | Freq: Two times a day (BID) | ORAL | 0 refills | Status: AC

## 2024-09-13 MED ORDER — SODIUM CHLORIDE 0.9 % IV BOLUS
1000.0000 mL | Freq: Once | INTRAVENOUS | Status: AC
  Administered 2024-09-13: 1000 mL via INTRAVENOUS

## 2024-09-13 NOTE — Discharge Instructions (Signed)
 It was a pleasure taking care of [name] today.  She received IV fluids.  Her urine shows she may be still developing a urinary tract infection therefore we are treating this again.  Make sure to encourage fluids at home.  Follow-up outpatient with primary care provider  Return for new or worsening symptoms

## 2024-09-13 NOTE — ED Triage Notes (Signed)
 Pt bib family member, referred by urology for fluid hydration. Recent tx for UTI

## 2024-09-13 NOTE — ED Provider Notes (Signed)
 [place] EMERGENCY DEPARTMENT AT [facility] Provider Note   CSN: [id number] Arrival date & time: 09/13/24  1330    Patient presents with: Dehydration   [name] is a 89 y.o. female here for evaluation of dehydration.  89 year old who was admitted a few weeks ago by myself for altered mental status, UTI and acute kidney injury.  Was subsequently discharged and sent home on Cipro -- per family did not receive medication.  Had labs rechecked by PCP and still has elevated creatinine.  She was seen by urology, Dr. Nieves who thought likely was due to dehydration from poor oral intake.  Patient still has some mild dysuria.  Mentation baseline.  As history of dementia.  Was living with son in [place]  however has been recently staying with the members locally in [place].  No fever, nausea, vomiting, chest pain, shortness of breath.  She has chronic ulcer to her left calcaneus which is unchanged.  She has had a right lower extremity amputation.  No diarrhea.  Left 5 caveat-dementia, family at bedside provides history  Lives with family   HPI     Prior to Admission medications   Medication Sig Start Date End Date Taking? Authorizing Provider  ciprofloxacin  (CIPRO ) 500 MG tablet Take 1 tablet (500 mg total) by mouth every 12 (twelve) hours for 5 days. 09/13/24 09/18/24 Yes Bostyn Bogie A, PA-C  Alpha-Lipoic Acid 600 MG CAPS Take 1,200 mg by mouth daily.    [provider]  amitriptyline  (ELAVIL ) 25 MG tablet Take 1 tablet (25 mg total) by mouth at bedtime. 03/28/24   Paz, Jose E, MD  Ascorbic Acid  (VITAMIN C WITH ROSE HIPS) 500 MG tablet Take 500 mg by mouth in the morning and at bedtime.    [provider]  atorvastatin  (LIPITOR) 40 MG tablet Take 40 mg by mouth daily. 12/01/18   [provider]  BERBERINE CHLORIDE PO Take by mouth. 1 every morning, 2 at bedtime. OTC    [provider]  calcium  carbonate (TUMS EX) 750 MG chewable  tablet Chew 4 tablets by mouth daily.    [provider]  carvedilol  (COREG ) 12.5 MG tablet Take 12.5 mg by mouth 2 (two) times daily with a meal.    [provider]  CINNAMON PO Take 1 tablet by mouth daily.    [provider]  clopidogrel  (PLAVIX ) 75 MG tablet Take 1 tablet (75 mg total) by mouth at bedtime. Restart on 12/04/2020 11/30/20   Bergman, Meghan D, NP  collagenase  (SANTYL ) 250 UNIT/GM ointment Apply 1 Application topically daily. 09/05/24   Tobie Franky SQUIBB, DPM  collagenase  (SANTYL ) 250 UNIT/GM ointment Apply 1 Application topically daily. 09/08/24   Tobie Franky SQUIBB, DPM  Cranberry 200 MG CAPS Take 2 capsules by mouth daily. *36 mg extract*    [provider]  diclofenac  Sodium (VOLTAREN  ARTHRITIS PAIN) 1 % GEL Apply topically 4 (four) times daily.    [provider]  dicyclomine  (BENTYL ) 10 MG capsule Take 1 capsule (10 mg total) by mouth 2 (two) times daily as needed for spasms. 09/09/24   May, Deanna J, NP  donepezil  (ARICEPT ) 10 MG tablet Take 1 tablet (10 mg total) by mouth at bedtime. 09/05/24   Amon Aloysius BRAVO, MD  estradiol  (ESTRACE ) 0.01 % CREA vaginal cream Apply one gram of cream vaginally 2-3 x a week 09/09/24   Copland, Harlene JAYSON, MD  HYDROcodone -acetaminophen  (NORCO) 10-325 MG tablet Take 1 tablet by mouth every 6 (six)  hours as needed. 09/08/24   Webb, Padonda B, FNP  naloxone  (NARCAN ) nasal spray 4 mg/0.1 mL Place 1 spray into the nose once as needed for up to 1 dose. 10/06/20   Amon Aloysius BRAVO, MD  nitroGLYCERIN  (NITROSTAT ) 0.4 MG SL tablet Place 1 tablet (0.4 mg total) under the tongue every 5 (five) minutes x 3 doses as needed for chest pain. 12/24/19   Amon Aloysius BRAVO, MD  omeprazole  (PRILOSEC) 20 MG capsule Take 1 capsule (20 mg total) by mouth 2 (two) times daily as needed for up to 14 days. 09/11/24 09/25/24  Nandigam, Kavitha V, MD  ondansetron  (ZOFRAN ) 4 MG tablet Take 1 tablet (4 mg total) by mouth every 8 (eight) hours as needed for  nausea or vomiting. 04/18/23   Paz, Jose E, MD  pramipexole  (MIRAPEX ) 0.125 MG tablet Take 1-2 tablets (0.125-0.25 mg total) by mouth at bedtime as needed. Patient not taking: Reported on 09/04/2024 07/07/24   Paz, Jose E, MD  Probiotic Product (PROBIOTIC PEARLS PO) Take 1 tablet by mouth daily.    [provider]  rivaroxaban  (XARELTO ) 2.5 MG TABS tablet Take 1 tablet (2.5 mg total) by mouth 2 (two) times daily. Restart on 12/04/2020 11/30/20   Bergman, Meghan D, NP  Zinc Sulfate 66 MG TABS     [provider]    Allergies: Metformin  and related, Cymbalta [duloxetine hcl], Gabapentin, Silicone, Tramadol, Adhesive [tape], Cefazolin, Ciprofloxacin , Clorazepate dipotassium, Dilaudid  [hydromorphone  hcl], Doxycycline, Enablex [darifenacin hydrobromide er], Levofloxacin, Lyrica [pregabalin], Methadone hcl, Morphine  and codeine, and Talwin [pentazocine]    Review of Systems  Constitutional: Negative.   HENT: Negative.    Respiratory: Negative.    Cardiovascular: Negative.   Gastrointestinal: Negative.   Genitourinary:  Positive for decreased urine volume and dysuria. Negative for difficulty urinating, dyspareunia, flank pain, frequency, pelvic pain and urgency.  Musculoskeletal: Negative.   Skin: Negative.   Neurological: Negative.   All other systems reviewed and are negative.   Updated Vital Signs BP (!) 103/55   Pulse 62   Temp 97.9 F (36.6 C) (Oral)   Resp (!) 21   Ht 5' 1 (1.549 m)   Wt 63 kg   SpO2 100%   BMI 26.24 kg/m   Physical Exam Vitals and nursing note reviewed.  Constitutional:      General: She is not in acute distress.    Appearance: She is well-developed. She is not ill-appearing, toxic-appearing or diaphoretic.  HENT:     Head: Normocephalic and atraumatic.     Mouth/Throat:     Mouth: Mucous membranes are moist.  Eyes:     Pupils: Pupils are equal, round, and reactive to light.  Cardiovascular:     Rate and Rhythm: Normal rate.     Pulses:           Dorsalis pedis pulses are detected w/ Doppler on the left side. Right dorsalis pedis pulse not accessible.        Right posterior tibial pulse not accessible.     Heart sounds: Normal heart sounds.  Pulmonary:     Effort: Pulmonary effort is normal. No respiratory distress.     Breath sounds: Normal breath sounds.  Abdominal:     General: Bowel sounds are normal. There is no distension.     Palpations: Abdomen is soft.     Tenderness: There is no abdominal tenderness. There is no right CVA tenderness, left CVA tenderness, guarding or rebound.  Musculoskeletal:  General: Normal range of motion.     Cervical back: Normal range of motion.     Comments: Right lower extremity amputation.  Left lower extremity soft, nontender.  Ulceration left calcaneus without active drainage, no exposed bone, surrounding erythema, warmth.  Ulceration with granular tissue at base.     Right Lower Extremity: Right leg is amputated below knee.  Skin:    General: Skin is warm and dry.  Neurological:     Mental Status: She is alert. Mental status is at baseline.     Comments: Pleasantly confused.  Psychiatric:        Mood and Affect: Mood normal.    (all labs ordered are listed, but only abnormal results are displayed) Labs Reviewed  COMPREHENSIVE METABOLIC PANEL WITH GFR - Abnormal; Notable for the following components:      Result Value   CO2 18 (*)    Glucose, Bld 104 (*)    BUN 66 (*)    Creatinine, Ser 2.35 (*)    Calcium  8.6 (*)    Total Protein 5.9 (*)    GFR, Estimated 19 (*)    All other components within normal limits  CBC - Abnormal; Notable for the following components:   RBC 2.76 (*)    Hemoglobin 8.5 (*)    HCT 27.1 (*)    Platelets 137 (*)    All other components within normal limits  URINALYSIS, ROUTINE W REFLEX MICROSCOPIC - Abnormal; Notable for the following components:   Leukocytes,Ua MODERATE (*)    Bacteria, UA FEW (*)    All other components within normal  limits  BASIC METABOLIC PANEL WITH GFR - Abnormal; Notable for the following components:   Chloride 112 (*)    CO2 16 (*)    BUN 63 (*)    Creatinine, Ser 2.23 (*)    Calcium  7.9 (*)    GFR, Estimated 21 (*)    All other components within normal limits  URINE CULTURE    EKG: None  Radiology: No results found.   Procedures   Medications Ordered in the ED  sodium chloride  0.9 % bolus 1,000 mL (0 mLs Intravenous Stopped 09/13/24 1545)   89 year old with multiple medical comorbidities here for evaluation of dehydration requesting IV fluids.  She was actually admitted by me a few weeks ago for AKI, UTI and altered mental status.  Was subsequently discharged home and was given a 5-day course of Cipro .  Family states she still continues to have elevated kidney function, was recommended come here for IV fluids as she has had decreased oral intake from her prior admission.  Here she is pleasantly confused, has history of dementia.  She does have some mild dysuria persistently since her prior discharge.  Will plan on rechecking labs, urine, IV fluids and reassess.  No recent echocardiogram in the computer, follows with cardiology in [facility] personally viewed and interpreted:  CBC without leukocytosis, hemoglobin 8.5 Metabolic panel creatinine 2.35 UA moderate leuks, 21-50 WBX, few bacteria>> will sent for culture and treat   Repeat BMP creatinine 2.23  Long discussion with patient, son at bedside.  Shared decision making.  She has had minimal improvement with kidney function with IV fluids here in the ED and her creatinine is still above her baseline from a few weeks ago  however is still similar to what she was discharged with in the hospital.  I suspect this is likely due to poor p.o. intake.  I reviewed a heart catheterization  from 2017 which is the most recent I could see where she had an EF of 40%.  Share decision make with family for inpatient management for continued IVF  versus outpatient management.  We discussed risk versus benefit of inpatient admission versus outpatient treatment.  Family voices understanding of both options.  Family declines admission at this time. Will start on antibiotics for her UTI while pending culture given she did not complete course outpatient per son in room.  Urine here sent for culture. Encourage increase in fluids at home, close FU with PCP.  Discussed with Dr. Lenor who is agreeable with above treatment, plan and disposition.                                   Medical Decision Making Amount and/or Complexity of Data Reviewed Independent Historian:     Details: Son at bedside External Data Reviewed: labs and notes. Labs: ordered. Decision-making details documented in ED Course.  Risk OTC drugs. Prescription drug management. Decision regarding hospitalization. Diagnosis or treatment significantly limited by social determinants of health.        Final diagnoses:  Dehydration  Acute cystitis without hematuria    ED Discharge Orders          Ordered    ciprofloxacin  (CIPRO ) 500 MG tablet  Every 12 hours        09/13/24 1906               Madisin Hasan A, PA-C 09/13/24 1937    Mannie Pac T, DO 09/14/24 [phone]

## 2024-09-13 NOTE — ED Notes (Signed)
 Pt is unable to provide a urine sample at this time.

## 2024-09-16 LAB — URINE CULTURE: Culture: 20000 — AB

## 2024-09-22 ENCOUNTER — Other Ambulatory Visit: Payer: Self-pay | Admitting: Internal Medicine

## 2024-09-22 DIAGNOSIS — G43009 Migraine without aura, not intractable, without status migrainosus: Secondary | ICD-10-CM

## 2024-09-25 DIAGNOSIS — N3020 Other chronic cystitis without hematuria: Secondary | ICD-10-CM | POA: Diagnosis not present

## 2024-09-30 DIAGNOSIS — E1152 Type 2 diabetes mellitus with diabetic peripheral angiopathy with gangrene: Secondary | ICD-10-CM | POA: Diagnosis not present

## 2024-09-30 DIAGNOSIS — E119 Type 2 diabetes mellitus without complications: Secondary | ICD-10-CM | POA: Diagnosis not present

## 2024-09-30 DIAGNOSIS — I25728 Atherosclerosis of autologous artery coronary artery bypass graft(s) with other forms of angina pectoris: Secondary | ICD-10-CM | POA: Diagnosis not present

## 2024-09-30 DIAGNOSIS — I890 Lymphedema, not elsewhere classified: Secondary | ICD-10-CM | POA: Diagnosis not present

## 2024-09-30 DIAGNOSIS — I70213 Atherosclerosis of native arteries of extremities with intermittent claudication, bilateral legs: Secondary | ICD-10-CM | POA: Diagnosis not present

## 2024-09-30 DIAGNOSIS — L8990 Pressure ulcer of unspecified site, unspecified stage: Secondary | ICD-10-CM | POA: Diagnosis not present

## 2024-09-30 DIAGNOSIS — I7025 Atherosclerosis of native arteries of other extremities with ulceration: Secondary | ICD-10-CM | POA: Diagnosis not present

## 2024-09-30 DIAGNOSIS — I10 Essential (primary) hypertension: Secondary | ICD-10-CM | POA: Diagnosis not present

## 2024-09-30 DIAGNOSIS — I70245 Atherosclerosis of native arteries of left leg with ulceration of other part of foot: Secondary | ICD-10-CM | POA: Diagnosis not present

## 2024-09-30 DIAGNOSIS — E782 Mixed hyperlipidemia: Secondary | ICD-10-CM | POA: Diagnosis not present

## 2024-09-30 DIAGNOSIS — E785 Hyperlipidemia, unspecified: Secondary | ICD-10-CM | POA: Diagnosis not present

## 2024-09-30 DIAGNOSIS — I70244 Atherosclerosis of native arteries of left leg with ulceration of heel and midfoot: Secondary | ICD-10-CM | POA: Diagnosis not present

## 2024-10-01 ENCOUNTER — Telehealth: Payer: Self-pay

## 2024-10-01 NOTE — Telephone Encounter (Signed)
 Copied from CRM #[id number]. Topic: Clinical - Medication Question >> Oct 01, 2024  2:40 PM Sasha M wrote: Reason for CRM: Pt son called today because she is concerned about his mothers increased car sickness. He states that she already takes zofran  but it's not helping anymore. He would like to know if he can give her Bonine or Dramamine to help. Please call Reyes at [phone] to advise of next best alternative/assistance

## 2024-10-01 NOTE — Telephone Encounter (Signed)
 Please advise

## 2024-10-02 DIAGNOSIS — I890 Lymphedema, not elsewhere classified: Secondary | ICD-10-CM | POA: Diagnosis not present

## 2024-10-02 DIAGNOSIS — I70245 Atherosclerosis of native arteries of left leg with ulceration of other part of foot: Secondary | ICD-10-CM | POA: Diagnosis not present

## 2024-10-02 DIAGNOSIS — I10 Essential (primary) hypertension: Secondary | ICD-10-CM | POA: Diagnosis not present

## 2024-10-02 DIAGNOSIS — I70244 Atherosclerosis of native arteries of left leg with ulceration of heel and midfoot: Secondary | ICD-10-CM | POA: Diagnosis not present

## 2024-10-02 DIAGNOSIS — I7025 Atherosclerosis of native arteries of other extremities with ulceration: Secondary | ICD-10-CM | POA: Diagnosis not present

## 2024-10-02 DIAGNOSIS — E1152 Type 2 diabetes mellitus with diabetic peripheral angiopathy with gangrene: Secondary | ICD-10-CM | POA: Diagnosis not present

## 2024-10-02 DIAGNOSIS — E785 Hyperlipidemia, unspecified: Secondary | ICD-10-CM | POA: Diagnosis not present

## 2024-10-02 DIAGNOSIS — I70213 Atherosclerosis of native arteries of extremities with intermittent claudication, bilateral legs: Secondary | ICD-10-CM | POA: Diagnosis not present

## 2024-10-02 NOTE — Telephone Encounter (Signed)
 Spoke w/ Pt's son Reyes- informed of recommendations. ED f/u scheduled for next week.

## 2024-10-03 DIAGNOSIS — E1152 Type 2 diabetes mellitus with diabetic peripheral angiopathy with gangrene: Secondary | ICD-10-CM | POA: Diagnosis not present

## 2024-10-03 DIAGNOSIS — E785 Hyperlipidemia, unspecified: Secondary | ICD-10-CM | POA: Diagnosis not present

## 2024-10-03 DIAGNOSIS — I70213 Atherosclerosis of native arteries of extremities with intermittent claudication, bilateral legs: Secondary | ICD-10-CM | POA: Diagnosis not present

## 2024-10-03 DIAGNOSIS — I7025 Atherosclerosis of native arteries of other extremities with ulceration: Secondary | ICD-10-CM | POA: Diagnosis not present

## 2024-10-09 ENCOUNTER — Encounter: Payer: Self-pay | Admitting: Family

## 2024-10-09 ENCOUNTER — Ambulatory Visit (INDEPENDENT_AMBULATORY_CARE_PROVIDER_SITE_OTHER): Admitting: Family

## 2024-10-09 VITALS — BP 124/63 | HR 98 | Temp 98.3°F | Resp 16 | Ht 61.0 in | Wt 140.0 lb

## 2024-10-09 DIAGNOSIS — S91302A Unspecified open wound, left foot, initial encounter: Secondary | ICD-10-CM

## 2024-10-09 DIAGNOSIS — E0590 Thyrotoxicosis, unspecified without thyrotoxic crisis or storm: Secondary | ICD-10-CM

## 2024-10-09 DIAGNOSIS — I2510 Atherosclerotic heart disease of native coronary artery without angina pectoris: Secondary | ICD-10-CM | POA: Diagnosis not present

## 2024-10-09 DIAGNOSIS — N390 Urinary tract infection, site not specified: Secondary | ICD-10-CM | POA: Diagnosis not present

## 2024-10-09 DIAGNOSIS — E1142 Type 2 diabetes mellitus with diabetic polyneuropathy: Secondary | ICD-10-CM | POA: Diagnosis not present

## 2024-10-09 DIAGNOSIS — A499 Bacterial infection, unspecified: Secondary | ICD-10-CM | POA: Diagnosis not present

## 2024-10-09 DIAGNOSIS — Z09 Encounter for follow-up examination after completed treatment for conditions other than malignant neoplasm: Secondary | ICD-10-CM | POA: Diagnosis not present

## 2024-10-09 DIAGNOSIS — E782 Mixed hyperlipidemia: Secondary | ICD-10-CM

## 2024-10-09 DIAGNOSIS — Z89511 Acquired absence of right leg below knee: Secondary | ICD-10-CM

## 2024-10-09 NOTE — Progress Notes (Signed)
 Acute Office Visit  Subjective:     Patient ID: [name], female    DOB: [birth year], 89 y.o.   MRN: [id number]  Chief Complaint  Patient presents with   Hospitalization Follow-up    Patient went to the hospital for dehydration    HPI Patient is in today  as a follow-up from the hospital where she presented with dehydration and a urinary tract infection.  She continues to be on doxycycline and will start amoxicillin  for a wound that she has on her left foot.  She is seeing the wound care clinic for that.  While in the hospital, she was given IV antibiotics that did not completely clear the UTI.  Family is here today-grandson who has concerns that she has decreased appetite and not eating well.  Patient reports that sometimes her stomach feels upset when she eats and just does not have a desire to eat.  Patient has a history of dementia, congestive heart failure, type 2 diabetes, coronary artery disease.  She is currently being cared for by her son, daughter, and grandson.  Review of Systems  Constitutional:  Negative for chills and fever.  Respiratory: Negative.    Cardiovascular: Negative.   Gastrointestinal:  Positive for nausea. Negative for vomiting.  Genitourinary: Negative.        On antibiotics for UTI  Musculoskeletal:        Right BKA  Skin:        Will wound to the left foot.    Psychiatric/Behavioral:         Has dementia and confused  All other systems reviewed and are negative.  Past Medical History:  Diagnosis Date   Allergic rhinitis    Arthritis    back; ankles; hands; knees (10/31/2013)   Bilateral sensorineural hearing loss    CAD (coronary artery disease)    a. Nonobst in 2011. b. Abnormal nuc 09/2013 -> s/p cutting balloon to D2, mild LAD disease; c. 08/2015 MV: no ischemia, EF 71%.   Carcinoma in situ in a polyp 1994   a. 1994 - malignant polyp removed during colonoscopy.   Chronic diastolic CHF (congestive heart failure) (HCC)    a. 09/2013  Echo: EF 60-65%, mild LVH, Gr1 DD.   Dementia (HCC)    Diverticulosis    DJD (degenerative joint disease)    Fatty liver    GERD (gastroesophageal reflux disease)    a. Hx GERD/esophageal dysmotility followed by Dr. Obie.    Hemorrhoids    Hiatal hernia    a. s/p Nissen fundoplication 2011.   Hyperlipidemia    a. patient unwilling to use statins.   Hypertensive heart disease    IBS (irritable bowel syndrome)    Macular degeneration 03/2009   Dr. Nicholaus   Neuropathy    a. Hands, feet, legs.   Orthostatic hypotension    Osteomyelitis (HCC)    a. 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 01/2016 s/p RLE transtibial amputation 2/2 Charcot rocker-bottom deformity and insensate neuropathy ulceration.   Osteoporosis    PE (pulmonary embolism) 12/2007   a. PE/DVT after neck surgery 2009. b. coumadin  d/c 10-2008.   PONV (postoperative nausea and vomiting) 2009   neck surgery   PPD positive    Recurrent UTI    RSD (reflex sympathetic dystrophy)    a. Chronic pain.   Spinal stenosis    Type II diabetes mellitus (HCC)    no on medication  Venous insufficiency    a. Contributing to LEE.   Wound of left foot     Social History   Socioeconomic History   Marital status: Divorced    Spouse name: Not on file   Number of children: 3   Years of education: 15   Highest education level: Some college, no degree  Occupational History   Occupation: Retired     Comment: from social services   Tobacco Use   Smoking status: Never   Smokeless tobacco: Never   Tobacco comments:    tried for a few months in college.   Vaping Use   Vaping status: Never Used  Substance and Sexual Activity   Alcohol  use: No    Alcohol /week: 0.0 standard drinks of alcohol    Drug use: No   Sexual activity: Not Currently    Birth control/protection: Post-menopausal  Other Topics Concern   Not on file  Social  History Narrative   Daughter Karna deceased Oct 07, 2020   daughter lives in town   1 son in [place], Reyes   Caffeine use:  Tea 1/day w/ dinner   Coffee occass    Currently lives in [place] with son Kayal Mula.   Social Drivers of Health   Tobacco Use: Low Risk (10/09/2024)   Patient History    Smoking Tobacco Use: Never    Smokeless Tobacco Use: Never    Passive Exposure: Not on file  Financial Resource Strain: Low Risk (10/04/2024)   Overall Financial Resource Strain (CARDIA)    Difficulty of Paying Living Expenses: Not hard at all  Food Insecurity: No Food Insecurity (10/04/2024)   Epic    Worried About Programme Researcher, Broadcasting/film/video in the Last Year: Never true    Ran Out of Food in the Last Year: Never true  Transportation Needs: No Transportation Needs (10/04/2024)   Epic    Lack of Transportation (Medical): No    Lack of Transportation (Non-Medical): No  Physical Activity: Inactive (10/04/2024)   Exercise Vital Sign    Days of Exercise per Week: 0 days    Minutes of Exercise per Session: Not on file  Stress: No Stress Concern Present (10/04/2024)   [employer] of Occupational Health - Occupational Stress Questionnaire    Feeling of Stress: Not at all  Social Connections: Socially Isolated (10/04/2024)   Social Connection and Isolation Panel    Frequency of Communication with Friends and Family: More than three times a week    Frequency of Social Gatherings with Friends and Family: More than three times a week    Attends Religious Services: Never    Database Administrator or Organizations: No    Attends Engineer, Structural: Not on file    Marital Status: Divorced  Intimate Partner Violence: Not At Risk (08/26/2024)   Epic    Fear of Current or Ex-Partner: No    Emotionally Abused: No    Physically Abused: No    Sexually Abused: No  Depression (PHQ2-9): Low Risk (09/01/2024)   Depression (PHQ2-9)    PHQ-2 Score: 0  Alcohol  Screen: Low Risk  (07/01/2024)   Alcohol  Screen    Last Alcohol  Screening Score (AUDIT): 0  Housing: Low Risk (10/04/2024)   Epic    Unable to Pay for Housing in the Last Year: No    Number of Times Moved in the Last Year: 0    Homeless in the Last Year: No  Utilities: Not At Risk (08/26/2024)   Epic  Threatened with loss of utilities: No  Health Literacy: Inadequate Health Literacy (07/01/2024)   B1300 Health Literacy    Frequency of need for help with medical instructions: Often    Past Surgical History:  Procedure Laterality Date   AMPUTATION  03/06/2012   Procedure: AMPUTATION FOOT;  Surgeon: Jerona LULLA Sage, MD;  Location: [facility] OR;  Service: Orthopedics;  Laterality: Left;  FIFTH RAY AMPUTATION    AMPUTATION Right 02/18/2016   Procedure: AMPUTATION BELOW KNEE;  Surgeon: Jerona Sage LULLA, MD;  Location: [facility] OR;  Service: Orthopedics;  Laterality: Right;   AMPUTATION Left 11/22/2016   Procedure: Amputation 4th Toe Left Foot at Metatarsophalangeal Joint;  Surgeon: Jerona Sage LULLA, MD;  Location: [facility] OR;  Service: Orthopedics;  Laterality: Left;   ANKLE FUSION  09/27/2012   Procedure: ANKLE FUSION;  Surgeon: Jerona LULLA Sage, MD;  Location: [facility] OR;  Service: Orthopedics;  Laterality: Left;  Left Tibiocalcaneal Fusion   ANKLE FUSION Right 05/09/2013   Procedure: ANKLE FUSION;  Surgeon: Jerona LULLA Sage, MD;  Location: [facility] OR;  Service: Orthopedics;  Laterality: Right;  Excision Osteomyelitis Base 1st MT Right Foot, Fusion Medial Column   ANTERIOR CERVICAL DECOMP/DISCECTOMY FUSION  12/18/07   For OA,  Dr. Barbarann:  fu by a PE   ANTERIOR CERVICAL DECOMP/DISCECTOMY FUSION N/A 11/29/2020   Procedure: Anterior Cervical Decompression Fusion - Cervical Three-Cerivcal Four;  Surgeon: Louis Shove, MD;  Location: [facility] OR;  Service: Neurosurgery;  Laterality: N/A;  3C   CARDIAC CATHETERIZATION  05/2010    at [facility] TUNNEL RELEASE Left 09/2011   CATARACT EXTRACTION W/ INTRAOCULAR LENS  IMPLANT, BILATERAL  2004   feb  2004 left, aug 2004 right   CHOLECYSTECTOMY  03/2002   CORONARY ANGIOPLASTY  10/31/2013   CORONARY ARTERY BYPASS GRAFT  09/27/2016   CYST REMOVAL HAND  06/2003   FOOT BONE EXCISION Right 06/2009   LAPAROSCOPIC RIGHT HEMI COLECTOMY N/A 01/08/2015   Procedure: LAPAROSCOPIC ASSISTED RIGHT HEMI COLECTOMY;  Surgeon: Donnice Lunger, MD;  Location: [facility] ORS;  Service: General;  Laterality: N/A;   LEFT HEART CATHETERIZATION WITH CORONARY ANGIOGRAM N/A 10/31/2013   Procedure: LEFT HEART CATHETERIZATION WITH CORONARY ANGIOGRAM;  Surgeon: Candyce GORMAN Reek, MD;  Location: [facility] CATH LAB;  Service: Cardiovascular;  Laterality: N/A;   NASAL SEPTUM SURGERY  09/1963   NISSEN FUNDOPLICATION  01/25/2010   PERCUTANEOUS CORONARY INTERVENTION-BALLOON ONLY  10/31/2013   Procedure: PERCUTANEOUS CORONARY INTERVENTION-BALLOON ONLY;  Surgeon: Candyce GORMAN Reek, MD;  Location: [facility] CATH LAB;  Service: Cardiovascular;;   ROTATOR CUFF REPAIR Right 07/2007   Dr. Beverley FRIED CUFF REPAIR Left 11/2004   stent in left leg, behind knee  06/27/2017   x2, done at [facility] cardiology in [facility]   TOE AMPUTATION Right 08/2008   3rd toe, Dr. Sage due to osteomyelitis   VAGINAL HYSTERECTOMY  03/1975   VEIN LIGATION Bilateral 03/1966   VENA CAVA FILTER PLACEMENT  01/2010   green filter; due to blood clots   WISDOM TOOTH EXTRACTION  06/2007    Family History  Problem Relation Age of Onset   Heart disease Mother        mitral valve replaced   Arthritis Mother    Diabetic kidney disease Daughter    Diabetes Father    Stroke Father    Migraines Sister    Hypertension Other    Breast cancer Other    Colon cancer Neg Hx    Esophageal  cancer Neg Hx    Stomach cancer Neg Hx    Rectal cancer Neg Hx     Allergies[1]  Medications Ordered Prior to Encounter[2]  BP 124/63 (BP Location: Right Arm, Patient Position: Sitting, Cuff Size: Normal)   Pulse 98   Temp 98.3 F (36.8 C) (Oral)   Resp 16    Ht 5' 1 (1.549 m)   Wt 140 lb (63.5 kg) Comment: Patient in wheelchair  SpO2 100%   BMI 26.45 kg/m chart      Objective:    BP 124/63 (BP Location: Right Arm, Patient Position: Sitting, Cuff Size: Normal)   Pulse 98   Temp 98.3 F (36.8 C) (Oral)   Resp 16   Ht 5' 1 (1.549 m)   Wt 140 lb (63.5 kg) Comment: Patient in wheelchair  SpO2 100%   BMI 26.45 kg/m    Physical Exam Vitals and nursing note reviewed.  Constitutional:      Appearance: She is normal weight.     Comments: Darden present  Cardiovascular:     Rate and Rhythm: Normal rate and regular rhythm.  Pulmonary:     Effort: Pulmonary effort is normal.     Breath sounds: Normal breath sounds.  Abdominal:     General: Abdomen is flat. Bowel sounds are normal.  Musculoskeletal:        General: Normal range of motion.     Cervical back: Normal range of motion and neck supple.  Skin:    General: Skin is warm and dry.  Neurological:     General: No focal deficit present.     Mental Status: She is alert. She is disoriented.  Psychiatric:        Mood and Affect: Mood normal.    No results found for any visits on 10/09/24.      Assessment & Plan:   Problem List Items Addressed This Visit     S/P unilateral BKA (below knee amputation), right (HCC)   Relevant Orders   Basic Metabolic Panel (BMET)   Hepatic function panel   Hyperthyroidism   Relevant Orders   Basic Metabolic Panel (BMET)   Hepatic function panel   Hyperlipidemia   Relevant Orders   Basic Metabolic Panel (BMET)   Hepatic function panel   DM type 2 with diabetic peripheral neuropathy (HCC)   Relevant Orders   Basic Metabolic Panel (BMET)   Hepatic function panel   CAD (coronary artery disease)   Relevant Orders   Basic Metabolic Panel (BMET)   Hepatic function panel   Other Visit Diagnoses       Hospital discharge follow-up    -  Primary   Relevant Orders   Basic Metabolic Panel (BMET)   Hepatic function panel     Bacterial  urinary infection       Relevant Orders   Basic Metabolic Panel (BMET)   Hepatic function panel     Wound of left foot       Relevant Orders   Basic Metabolic Panel (BMET)   Hepatic function panel       No orders of the defined types were placed in this encounter.  I am concerned about patient's decrease in appetite and apparent weight loss.  Although she is not always able to weigh because she is a right BKA and has a wound on her left foot, it appears that she is losing weight as a result.  I have explained to the family that part of the concern  is delayed wound healing and increased risk of infection.  Ultimately, patient is 89 years old with multiple comorbidities and may be a good candidate for palliative care.  It appears the family, although doing a great job needs some additional support.  I have suggested Remeron to help increase her appetite.  Darden is here today and will take all of the recommendations back home to the family to make some determinations on how they would like to proceed.  In the meantime, continue seeing the wound care clinic.  Continue seeing all providers as scheduled to include Dr. Amon next month.  For now, we will check a BMP and liver function test.  Will notify patient pending results. Return in about 4 weeks (around 11/06/2024) for Dr. Amon.  Timya Trimmer B Zyaire Dumas, FNP       [1] Allergies Allergen Reactions   Metformin  And Related Diarrhea, Nausea Only and Other (See Comments)    dizzy, tired, chills   Cymbalta [Duloxetine Hcl] Swelling    Swelling in legs   Gabapentin Swelling    Swelling in legs   Silicone Hives, Itching, Dermatitis and Rash   Tramadol Swelling   Adhesive [Tape] Rash   Cefazolin Hives        Ciprofloxacin  Other (See Comments)    body aches    Clorazepate Dipotassium Other (See Comments)    Unknown reaction   Dilaudid  [Hydromorphone  Hcl] Itching     confused,   Doxycycline Rash   Enablex [Darifenacin Hydrobromide  Er] Other (See Comments)    Hypotension, near syncope   Levofloxacin Other (See Comments)    Causes wrist pain   Lyrica [Pregabalin] Swelling    Swelling in legs   Methadone Hcl Other (See Comments)    Reaction unknown   Morphine  And Codeine Other (See Comments)    Confusion, constipation.    Talwin [Pentazocine] Other (See Comments)    climbing walls anxiety  [2] Current Outpatient Medications on File Prior to Visit  Medication Sig Dispense Refill   Alpha-Lipoic Acid 600 MG CAPS Take 1,200 mg by mouth daily.     amitriptyline  (ELAVIL ) 25 MG tablet Take 1 tablet (25 mg total) by mouth at bedtime. 90 tablet 1   Ascorbic Acid  (VITAMIN C WITH ROSE HIPS) 500 MG tablet Take 500 mg by mouth in the morning and at bedtime.     atorvastatin  (LIPITOR) 40 MG tablet Take 40 mg by mouth daily.     BERBERINE CHLORIDE PO Take by mouth. 1 every morning, 2 at bedtime. OTC     calcium  carbonate (TUMS EX) 750 MG chewable tablet Chew 4 tablets by mouth daily.     carvedilol  (COREG ) 12.5 MG tablet Take 12.5 mg by mouth 2 (two) times daily with a meal.     CINNAMON PO Take 1 tablet by mouth daily.     clopidogrel  (PLAVIX ) 75 MG tablet Take 1 tablet (75 mg total) by mouth at bedtime. Restart on 12/04/2020     collagenase  (SANTYL ) 250 UNIT/GM ointment Apply 1 Application topically daily. 15 g 0   collagenase  (SANTYL ) 250 UNIT/GM ointment Apply 1 Application topically daily. 45 g 0   Cranberry 200 MG CAPS Take 2 capsules by mouth daily. *36 mg extract*     diclofenac  Sodium (VOLTAREN  ARTHRITIS PAIN) 1 % GEL Apply topically 4 (four) times daily.     dicyclomine  (BENTYL ) 10 MG capsule Take 1 capsule (10 mg total) by mouth 2 (two) times daily as needed for spasms. 60 capsule 2  donepezil  (ARICEPT ) 10 MG tablet Take 1 tablet (10 mg total) by mouth at bedtime. 90 tablet 1   estradiol  (ESTRACE ) 0.01 % CREA vaginal cream Apply one gram of cream vaginally 2-3 x a week 42.5 g 1    HYDROcodone -acetaminophen  (NORCO) 10-325 MG tablet Take 1 tablet by mouth every 6 (six) hours as needed. 120 tablet 0   naloxone  (NARCAN ) nasal spray 4 mg/0.1 mL Place 1 spray into the nose once as needed for up to 1 dose. 2 each 0   nitroGLYCERIN  (NITROSTAT ) 0.4 MG SL tablet Place 1 tablet (0.4 mg total) under the tongue every 5 (five) minutes x 3 doses as needed for chest pain. 25 tablet 3   ondansetron  (ZOFRAN ) 4 MG tablet Take 1 tablet (4 mg total) by mouth every 8 (eight) hours as needed for nausea or vomiting. 40 tablet 1   pramipexole  (MIRAPEX ) 0.125 MG tablet Take 1-2 tablets (0.125-0.25 mg total) by mouth at bedtime as needed. 180 tablet 1   Probiotic Product (PROBIOTIC PEARLS PO) Take 1 tablet by mouth daily.     rivaroxaban  (XARELTO ) 2.5 MG TABS tablet Take 1 tablet (2.5 mg total) by mouth 2 (two) times daily. Restart on 12/04/2020 60 tablet    Zinc Sulfate 66 MG TABS      omeprazole  (PRILOSEC) 20 MG capsule Take 1 capsule (20 mg total) by mouth 2 (two) times daily as needed for up to 14 days. 90 capsule 3   Current Facility-Administered Medications on File Prior to Visit  Medication Dose Route Frequency Provider Last Rate Last Admin   [START ON 12/28/2024] denosumab  (PROLIA ) injection 60 mg  60 mg Subcutaneous Q6 months Paz, Jose E, MD

## 2024-10-09 NOTE — Patient Instructions (Addendum)
 Today we discussed concerns of decreased appetite which can lead to delayed healing from his [place].  Delayed healing could lead to more infections.  Are we interested in a medication that could help increase the appetite like Remeron?  Remeron is typically taken at night which can help with rest but also increases appetite.  Additionally, we also discussed whether or not palliative care was a good option at this point.  I am not saying that she is terminal by any means but it would allow your family to get some additional support and resources with caring for [name].  Finally, is it worthwhile considering home health to help with the activities of daily living like dressing, feeding, grooming.  I know that you guys do an amazing job taking care of [name].  However, it may allow you to have some additional support.  This also may be beneficial with getting her back and forth to doctors appointments.  After you guys have a discussion, let me know your thoughts and I am happy to support in any way.  Dr. Amon will be back next week as well and is always willing to help.  Considering her weight is 138 pounds and she is not eating well, we should do a recheck in about a month to be sure that her weight is stabilizing.

## 2024-10-10 ENCOUNTER — Telehealth: Payer: Self-pay | Admitting: *Deleted

## 2024-10-10 DIAGNOSIS — R944 Abnormal results of kidney function studies: Secondary | ICD-10-CM

## 2024-10-10 LAB — HEPATIC FUNCTION PANEL
ALT: 8 U/L (ref 3–35)
AST: 12 U/L (ref 5–37)
Albumin: 3.6 g/dL (ref 3.5–5.2)
Alkaline Phosphatase: 39 U/L (ref 39–117)
Bilirubin, Direct: 0.1 mg/dL (ref 0.1–0.3)
Total Bilirubin: 0.3 mg/dL (ref 0.2–1.2)
Total Protein: 5.6 g/dL — ABNORMAL LOW (ref 6.0–8.3)

## 2024-10-10 LAB — BASIC METABOLIC PANEL WITH GFR
BUN: 39 mg/dL — ABNORMAL HIGH (ref 6–23)
CO2: 15 meq/L — ABNORMAL LOW (ref 19–32)
Calcium: 7.3 mg/dL — ABNORMAL LOW (ref 8.4–10.5)
Chloride: 119 meq/L — ABNORMAL HIGH (ref 96–112)
Creatinine, Ser: 2.89 mg/dL — ABNORMAL HIGH (ref 0.40–1.20)
GFR: 14.19 mL/min — CL
Glucose, Bld: 123 mg/dL — ABNORMAL HIGH (ref 70–99)
Potassium: 4.8 meq/L (ref 3.5–5.1)
Sodium: 142 meq/L (ref 135–145)

## 2024-10-10 NOTE — Telephone Encounter (Signed)
 Pt son Reyes notified and he will advised to get more fluids in.  He stated that it has been hard.  Also advised that if she consuming less and getting weaker to go to ER.  Her grandson will bring her in on Monday to recheck labs.

## 2024-10-10 NOTE — Telephone Encounter (Signed)
 Appears close to baseline. Mild AKI. If she is able to drink more, this should improve. Would recommend this and rechecking early next week. If she is consuming less by mouth and getting weaker, go to ER. Thx.

## 2024-10-10 NOTE — Telephone Encounter (Signed)
 LAB VALUE ALREADY IN EPIC  CRITICAL VALUE STICKER  CRITICAL VALUE:  GFR 14.19  MESSENGER (representative from lab): Saa

## 2024-10-12 ENCOUNTER — Ambulatory Visit: Payer: Self-pay | Admitting: Family

## 2024-10-12 ENCOUNTER — Other Ambulatory Visit: Payer: Self-pay | Admitting: Internal Medicine

## 2024-10-13 ENCOUNTER — Other Ambulatory Visit (INDEPENDENT_AMBULATORY_CARE_PROVIDER_SITE_OTHER)

## 2024-10-13 ENCOUNTER — Ambulatory Visit: Payer: Self-pay | Admitting: Family Medicine

## 2024-10-13 DIAGNOSIS — R944 Abnormal results of kidney function studies: Secondary | ICD-10-CM | POA: Diagnosis not present

## 2024-10-13 LAB — COMPREHENSIVE METABOLIC PANEL WITH GFR
ALT: 7 U/L (ref 3–35)
AST: 9 U/L (ref 5–37)
Albumin: 3.8 g/dL (ref 3.5–5.2)
Alkaline Phosphatase: 41 U/L (ref 39–117)
BUN: 32 mg/dL — ABNORMAL HIGH (ref 6–23)
CO2: 13 meq/L — ABNORMAL LOW (ref 19–32)
Calcium: 7.3 mg/dL — ABNORMAL LOW (ref 8.4–10.5)
Chloride: 118 meq/L — ABNORMAL HIGH (ref 96–112)
Creatinine, Ser: 2.62 mg/dL — ABNORMAL HIGH (ref 0.40–1.20)
GFR: 15.97 mL/min — ABNORMAL LOW
Glucose, Bld: 94 mg/dL (ref 70–99)
Potassium: 5 meq/L (ref 3.5–5.1)
Sodium: 140 meq/L (ref 135–145)
Total Bilirubin: 0.3 mg/dL (ref 0.2–1.2)
Total Protein: 5.9 g/dL — ABNORMAL LOW (ref 6.0–8.3)

## 2024-10-15 ENCOUNTER — Ambulatory Visit: Payer: Self-pay

## 2024-10-15 ENCOUNTER — Other Ambulatory Visit: Payer: Self-pay | Admitting: Internal Medicine

## 2024-10-15 MED ORDER — PROMETHAZINE-DM 6.25-15 MG/5ML PO SYRP: 5.0000 mL | ORAL_SOLUTION | Freq: Four times a day (QID) | ORAL | 0 refills | Status: DC | PRN

## 2024-10-15 NOTE — Telephone Encounter (Signed)
 FYI Only or Action Required?: Action required by provider: clinical question for provider and update on patient condition.  Patient was last seen in primary care on 10/09/2024 by Douglass Kenney NOVAK, FNP.  Called Nurse Triage reporting Advice Only.  Symptoms began several days ago.  Interventions attempted: OTC medications: robitussin and Rest, hydration, or home remedies.  Symptoms are: unchanged.  Triage Disposition: Call Pharmacist Within 24 Hours  Patient/caregiver understands and will follow disposition?: Yes   Copied from CRM #[id number]. Topic: Clinical - Red Word Triage >> Oct 15, 2024  9:26 AM Victoria A wrote: [facility] that prompted transfer to Nurse Triage: Patient's son called said patient is not better she is having severe congestion in her chest. Patient is having SOB and hard time breathing    Reason for Disposition  [1] Caller has medicine question about med NOT prescribed by primary care doctor (or NP/PA) or specialist AND [2] triager unable to answer question (e.g., compatibility with other med, storage)  Answer Assessment - Initial Assessment Questions 1. NAME of MEDICINE: What medicine(s) are you calling about?     Pt's son, Reyes, contacted clinic stating that pt has tried Robutusin without relief of symptoms. Pt is not in distress, states that SOB is related to congestion and pt is not experiencing chest pain or fever. Son requested rx of Promethazine  DM 6.25 5mL q4h as that is what he is taking and his symptoms have subsided. He was going to give pt some of his medications but worried about med interactions so he wanted to f/u with PCP first. Discussed that I would send info to PCP, for office to call back. Recommended he could always f/u with pharmacy as well. He voiced appreciation. Please advise.  Protocols used: Medication Question Call-A-AH

## 2024-10-15 NOTE — Telephone Encounter (Signed)
 Prescription sent. No interactions noted. Recommend rest, fluids, and seek medical attention if not gradually better.

## 2024-10-15 NOTE — Telephone Encounter (Signed)
 Spoke w/ Pt's son Reyes- informed of recommendations. They are actually in [place]- will need promethazine  syrup resent to [place] in [place]. I've called [facility] in [place] to cancel prescription.

## 2024-10-24 ENCOUNTER — Telehealth: Payer: Self-pay

## 2024-10-24 NOTE — Telephone Encounter (Signed)
 Copied from CRM [id number]. Topic: Clinical - Prescription Issue >> Oct 24, 2024  1:10 PM Dedra B wrote: Reason for CRM: Pt's son, Reyes, would like pt's prescription for promethazine -dextromethorphan (PROMETHAZINE -DM) 6.25-15 MG/5ML syrup sent back to [facility] in [facility]. It was originally sent there last week but they were in [place], but the pt is now back in [facility]. The [facility] location canceled the prescription.

## 2024-10-27 ENCOUNTER — Inpatient Hospital Stay

## 2024-10-27 ENCOUNTER — Inpatient Hospital Stay: Admitting: Family

## 2024-10-27 MED ORDER — PROMETHAZINE-DM 6.25-15 MG/5ML PO SYRP: 5.0000 mL | ORAL_SOLUTION | Freq: Four times a day (QID) | ORAL | 0 refills | Status: AC | PRN

## 2024-10-27 NOTE — Telephone Encounter (Signed)
 Prescription sent

## 2024-11-03 ENCOUNTER — Encounter: Payer: Self-pay | Admitting: Family

## 2024-11-07 ENCOUNTER — Telehealth: Payer: Self-pay | Admitting: Internal Medicine

## 2024-11-07 NOTE — Telephone Encounter (Signed)
 Spoke w/ Chyrl and Conny- informed that Pt does have a hx of type 2 diabetes, her A1c 4 years ago was 8.1%, however last A1c on 02/18/2024 was 5.3%. [facility] requested a letter with this information to be faxed to them.

## 2024-11-07 NOTE — Telephone Encounter (Signed)
 Copied from CRM [id number]. Topic: General - Other >> Nov 07, 2024  1:25 PM Alfonso HERO wrote: Reason for CRM: [facility] calling to inform patient is needing VAC dressing and requesting notes stating the patient isn't a diabetic or on any diabetic medication. Asking for this to be faxed over to [phone] Attn: Vicenta

## 2024-11-07 NOTE — Telephone Encounter (Signed)
 Copied from CRM [id number]. Topic: Clinical - Medical Advice >> Nov 07, 2024  2:30 PM Thersia BROCKS wrote: Reason for CRM: Patient son , called in stated that he needs patient last A1C reading would like a callback with that information  [phone]

## 2024-11-07 NOTE — Telephone Encounter (Signed)
 Pt is a diabetic. Error crm sent. I was not given a call back number.

## 2024-11-10 ENCOUNTER — Inpatient Hospital Stay: Admitting: Family

## 2024-11-10 ENCOUNTER — Inpatient Hospital Stay: Attending: Family

## 2024-11-10 ENCOUNTER — Encounter: Payer: Self-pay | Admitting: Internal Medicine

## 2024-11-10 ENCOUNTER — Ambulatory Visit: Admitting: Internal Medicine

## 2024-11-10 VITALS — BP 122/80 | HR 89 | Temp 97.8°F | Resp 18

## 2024-11-10 VITALS — BP 122/80 | HR 89 | Temp 97.8°F | Resp 18 | Ht 61.0 in

## 2024-11-10 DIAGNOSIS — R627 Adult failure to thrive: Secondary | ICD-10-CM | POA: Diagnosis not present

## 2024-11-10 DIAGNOSIS — E538 Deficiency of other specified B group vitamins: Secondary | ICD-10-CM

## 2024-11-10 DIAGNOSIS — Z79899 Other long term (current) drug therapy: Secondary | ICD-10-CM

## 2024-11-10 DIAGNOSIS — I10 Essential (primary) hypertension: Secondary | ICD-10-CM | POA: Diagnosis not present

## 2024-11-10 DIAGNOSIS — N184 Chronic kidney disease, stage 4 (severe): Secondary | ICD-10-CM

## 2024-11-10 DIAGNOSIS — D509 Iron deficiency anemia, unspecified: Secondary | ICD-10-CM | POA: Diagnosis not present

## 2024-11-10 DIAGNOSIS — D500 Iron deficiency anemia secondary to blood loss (chronic): Secondary | ICD-10-CM | POA: Diagnosis not present

## 2024-11-10 DIAGNOSIS — E1142 Type 2 diabetes mellitus with diabetic polyneuropathy: Secondary | ICD-10-CM | POA: Diagnosis not present

## 2024-11-10 DIAGNOSIS — N189 Chronic kidney disease, unspecified: Secondary | ICD-10-CM

## 2024-11-10 DIAGNOSIS — R410 Disorientation, unspecified: Secondary | ICD-10-CM

## 2024-11-10 DIAGNOSIS — G894 Chronic pain syndrome: Secondary | ICD-10-CM

## 2024-11-10 LAB — CMP (CANCER CENTER ONLY)
ALT: 11 U/L (ref 0–44)
AST: 17 U/L (ref 15–41)
Albumin: 3.8 g/dL (ref 3.5–5.0)
Alkaline Phosphatase: 40 U/L (ref 38–126)
Anion gap: 15 (ref 5–15)
BUN: 86 mg/dL — ABNORMAL HIGH (ref 8–23)
CO2: 12 mmol/L — ABNORMAL LOW (ref 22–32)
Calcium: 8.9 mg/dL (ref 8.9–10.3)
Chloride: 113 mmol/L — ABNORMAL HIGH (ref 98–111)
Creatinine: 2.45 mg/dL — ABNORMAL HIGH (ref 0.44–1.00)
GFR, Estimated: 19 mL/min — ABNORMAL LOW
Glucose, Bld: 117 mg/dL — ABNORMAL HIGH (ref 70–99)
Potassium: 3.9 mmol/L (ref 3.5–5.1)
Sodium: 139 mmol/L (ref 135–145)
Total Bilirubin: <0.2 mg/dL (ref 0.0–1.2)
Total Protein: 6.2 g/dL — ABNORMAL LOW (ref 6.5–8.1)

## 2024-11-10 LAB — CBC WITH DIFFERENTIAL (CANCER CENTER ONLY)
Abs Immature Granulocytes: 0.1 K/uL — ABNORMAL HIGH (ref 0.00–0.07)
Basophils Absolute: 0 K/uL (ref 0.0–0.1)
Basophils Relative: 0 %
Eosinophils Absolute: 0.1 K/uL (ref 0.0–0.5)
Eosinophils Relative: 1 %
HCT: 29 % — ABNORMAL LOW (ref 36.0–46.0)
Hemoglobin: 8.9 g/dL — ABNORMAL LOW (ref 12.0–15.0)
Immature Granulocytes: 1 %
Lymphocytes Relative: 12 %
Lymphs Abs: 1 K/uL (ref 0.7–4.0)
MCH: 31.2 pg (ref 26.0–34.0)
MCHC: 30.7 g/dL (ref 30.0–36.0)
MCV: 101.8 fL — ABNORMAL HIGH (ref 80.0–100.0)
Monocytes Absolute: 0.4 K/uL (ref 0.1–1.0)
Monocytes Relative: 5 %
Neutro Abs: 7 K/uL (ref 1.7–7.7)
Neutrophils Relative %: 81 %
Platelet Count: 148 K/uL — ABNORMAL LOW (ref 150–400)
RBC: 2.85 MIL/uL — ABNORMAL LOW (ref 3.87–5.11)
RDW: 17.2 % — ABNORMAL HIGH (ref 11.5–15.5)
WBC Count: 8.6 K/uL (ref 4.0–10.5)
nRBC: 0 % (ref 0.0–0.2)

## 2024-11-10 LAB — IRON AND IRON BINDING CAPACITY (CC-WL,HP ONLY)
Iron: 60 ug/dL (ref 28–170)
Saturation Ratios: 28 % (ref 10.4–31.8)
TIBC: 214 ug/dL — ABNORMAL LOW (ref 250–450)
UIBC: 155 ug/dL

## 2024-11-10 LAB — RETICULOCYTES
Immature Retic Fract: 16.4 % — ABNORMAL HIGH (ref 2.3–15.9)
RBC.: 2.87 MIL/uL — ABNORMAL LOW (ref 3.87–5.11)
Retic Count, Absolute: 74.9 K/uL (ref 19.0–186.0)
Retic Ct Pct: 2.6 % (ref 0.4–3.1)

## 2024-11-10 LAB — VITAMIN B12: Vitamin B-12: 830 pg/mL (ref 180–914)

## 2024-11-10 LAB — FERRITIN: Ferritin: 233 ng/mL (ref 11–307)

## 2024-11-10 MED ORDER — HYDROCODONE-ACETAMINOPHEN 10-325 MG PO TABS: 1.0000 | ORAL_TABLET | Freq: Four times a day (QID) | ORAL | 0 refills | Status: AC | PRN

## 2024-11-10 NOTE — Progress Notes (Unsigned)
 "  Subjective:    Patient ID: [name], female    DOB: [birth year], 89 y.o.   MRN: [id number]  DOS:  11/10/2024 Follow-up, here w/ Sidra, g-son  Discussed the use of AI scribe software for clinical note transcription with the patient, who gave verbal consent to proceed.  History of Present Illness [name] is an 89 year old female with dementia who presents with worsening confusion and decreased appetite.  Cognitive decline - Progressive worsening of confusion and memory impairment - Frequent repetition of questions - Impaired daily functioning and communication  Decreased appetite and nutritional status - Markedly decreased appetite - Frequent refusal of food - Receives daily 16-ounce protein shake for nutritional support  Musculoskeletal pain - Wrist pain improved with topical Voltaren  - Pain in the crotch area; caregiver concerned about possible unresolved urinary tract infection  Genitourinary symptoms - Uses bedside commode - Often urinates and defecates simultaneously  Cutaneous wounds and skin integrity - Healing bedsore on buttocks managed with offloading, cushion, topical cream, and dressing - Irritation in abdominal fold treated with Neosporin after prior cream was ineffective  Gastrointestinal symptoms - Chronic loose stools without watery diarrhea - Occasional fresh red blood in stool, suspected to be from hemorrhoids - No fever, chills, or vomiting  Lower extremity wound - Wound on ankle with exposed rod - Uses hydrocodone  for pain control; supply is running low    Review of Systems See above   Past Medical History:  Diagnosis Date   Allergic rhinitis    Arthritis    back; ankles; hands; knees (10/31/2013)   Bilateral sensorineural hearing loss    CAD (coronary artery disease)    a. Nonobst in 2011. b. Abnormal nuc 09/2013 -> s/p cutting balloon to D2, mild LAD disease; c. 08/2015 MV: no ischemia, EF 71%.   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 1994 - malignant polyp removed during colonoscopy.   Chronic diastolic CHF (congestive heart failure) (HCC)    a. 09/2013 Echo: EF 60-65%, mild LVH, Gr1 DD.   Dementia (HCC)    Diverticulosis    DJD (degenerative joint disease)    Fatty liver    GERD (gastroesophageal reflux disease)    a. Hx GERD/esophageal dysmotility followed by Dr. Obie.    Hemorrhoids    Hiatal hernia    a. s/p Nissen fundoplication 2011.   Hyperlipidemia    a. patient unwilling to use statins.   Hypertensive heart disease    IBS (irritable bowel syndrome)    Macular degeneration 03/2009   Dr. Nicholaus   Neuropathy    a. Hands, feet, legs.   Orthostatic hypotension    Osteomyelitis (HCC)    a. 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 01/2016 s/p RLE transtibial amputation 2/2 Charcot rocker-bottom deformity and insensate neuropathy ulceration.   Osteoporosis    PE (pulmonary embolism) 12/2007   a. PE/DVT after neck surgery 2009. b. coumadin  d/c 10-2008.   PONV (postoperative nausea and vomiting) 2009   neck surgery   PPD positive    Recurrent UTI    RSD (reflex sympathetic dystrophy)    a. Chronic pain.   Spinal stenosis    Type II diabetes mellitus (HCC)    no on medication   Venous insufficiency    a. Contributing to LEE.   Wound of left foot     Past Surgical History:  Procedure Laterality Date   AMPUTATION  03/06/2012  Procedure: AMPUTATION FOOT;  Surgeon: Jerona LULLA Sage, MD;  Location: [facility] OR;  Service: Orthopedics;  Laterality: Left;  FIFTH RAY AMPUTATION    AMPUTATION Right 02/18/2016   Procedure: AMPUTATION BELOW KNEE;  Surgeon: Jerona Sage LULLA, MD;  Location: [facility] OR;  Service: Orthopedics;  Laterality: Right;   AMPUTATION Left 11/22/2016   Procedure: Amputation 4th Toe Left Foot at Metatarsophalangeal Joint;  Surgeon: Jerona Sage LULLA, MD;  Location: [facility] OR;  Service: Orthopedics;  Laterality: Left;   ANKLE FUSION  09/27/2012   Procedure:  ANKLE FUSION;  Surgeon: Jerona LULLA Sage, MD;  Location: [facility] OR;  Service: Orthopedics;  Laterality: Left;  Left Tibiocalcaneal Fusion   ANKLE FUSION Right 05/09/2013   Procedure: ANKLE FUSION;  Surgeon: Jerona LULLA Sage, MD;  Location: [facility] OR;  Service: Orthopedics;  Laterality: Right;  Excision Osteomyelitis Base 1st MT Right Foot, Fusion Medial Column   ANTERIOR CERVICAL DECOMP/DISCECTOMY FUSION  12/18/07   For OA,  Dr. Barbarann:  fu by a PE   ANTERIOR CERVICAL DECOMP/DISCECTOMY FUSION N/A 11/29/2020   Procedure: Anterior Cervical Decompression Fusion - Cervical Three-Cerivcal Four;  Surgeon: Louis Shove, MD;  Location: [facility] OR;  Service: Neurosurgery;  Laterality: N/A;  3C   CARDIAC CATHETERIZATION  05/2010    at [facility] TUNNEL RELEASE Left 09/2011   CATARACT EXTRACTION W/ INTRAOCULAR LENS  IMPLANT, BILATERAL  2004   feb 2004 left, aug 2004 right   CHOLECYSTECTOMY  03/2002   CORONARY ANGIOPLASTY  10/31/2013   CORONARY ARTERY BYPASS GRAFT  09/27/2016   CYST REMOVAL HAND  06/2003   FOOT BONE EXCISION Right 06/2009   LAPAROSCOPIC RIGHT HEMI COLECTOMY N/A 01/08/2015   Procedure: LAPAROSCOPIC ASSISTED RIGHT HEMI COLECTOMY;  Surgeon: Donnice Lunger, MD;  Location: [facility] ORS;  Service: General;  Laterality: N/A;   LEFT HEART CATHETERIZATION WITH CORONARY ANGIOGRAM N/A 10/31/2013   Procedure: LEFT HEART CATHETERIZATION WITH CORONARY ANGIOGRAM;  Surgeon: Candyce GORMAN Reek, MD;  Location: [facility] CATH LAB;  Service: Cardiovascular;  Laterality: N/A;   NASAL SEPTUM SURGERY  09/1963   NISSEN FUNDOPLICATION  01/25/2010   PERCUTANEOUS CORONARY INTERVENTION-BALLOON ONLY  10/31/2013   Procedure: PERCUTANEOUS CORONARY INTERVENTION-BALLOON ONLY;  Surgeon: Candyce GORMAN Reek, MD;  Location: [facility] CATH LAB;  Service: Cardiovascular;;   ROTATOR CUFF REPAIR Right 07/2007   Dr. Beverley FRIED CUFF REPAIR Left 11/2004   stent in left leg, behind knee  06/27/2017   x2, done at [facility] cardiology in [facility]   TOE AMPUTATION  Right 08/2008   3rd toe, Dr. Sage due to osteomyelitis   VAGINAL HYSTERECTOMY  03/1975   VEIN LIGATION Bilateral 03/1966   VENA CAVA FILTER PLACEMENT  01/2010   green filter; due to blood clots   WISDOM TOOTH EXTRACTION  06/2007    Current Outpatient Medications  Medication Instructions   Alpha-Lipoic Acid 1,200 mg, Daily   amitriptyline  (ELAVIL ) 25 mg, Oral, Daily at bedtime   amoxicillin -clavulanate (AUGMENTIN ) 875-125 MG tablet 1 tablet, 2 times daily   atorvastatin  (LIPITOR) 40 mg, Daily   BERBERINE CHLORIDE PO Take by mouth. 1 every morning, 2 at bedtime. OTC   calcium  carbonate (TUMS EX) 750 MG chewable tablet 4 tablets, Daily   carvedilol  (COREG ) 12.5 mg, 2 times daily with meals   CINNAMON PO 1 tablet, Daily   clopidogrel  (PLAVIX ) 75 mg, Oral, Daily at bedtime, Restart on 12/04/2020   collagenase  (SANTYL ) 250 UNIT/GM ointment 1 Application, Topical, Daily   collagenase  (SANTYL ) 250  UNIT/GM ointment 1 Application, Topical, Daily   Cranberry 200 MG CAPS 2 capsules, Daily   diclofenac  Sodium (VOLTAREN  ARTHRITIS PAIN) 1 % GEL 4 times daily   dicyclomine  (BENTYL ) 10 mg, Oral, 2 times daily PRN   donepezil  (ARICEPT ) 10 mg, Oral, Daily at bedtime   estradiol  (ESTRACE ) 0.01 % CREA vaginal cream Apply one gram of cream vaginally 2-3 x a week   famotidine  (PEPCID ) 20 MG tablet TAKE 1 TABLET(20 MG) BY MOUTH TWICE DAILY   HYDROcodone -acetaminophen  (NORCO) 10-325 MG tablet 1 tablet, Oral, Every 6 hours PRN   levofloxacin (LEVAQUIN) 500 mg, Daily   naloxone  (NARCAN ) nasal spray 4 mg/0.1 mL 1 spray, Nasal, Once PRN   nitroGLYCERIN  (NITROSTAT ) 0.4 mg, Sublingual, Every 5 min x3 PRN   omeprazole  (PRILOSEC) 20 mg, Oral, 2 times daily PRN   ondansetron  (ZOFRAN ) 4 mg, Oral, Every 8 hours PRN   pramipexole  (MIRAPEX ) 0.125-0.25 mg, Oral, At bedtime PRN   Probiotic Product (PROBIOTIC PEARLS PO) 1 tablet, Daily   promethazine -dextromethorphan (PROMETHAZINE -DM) 6.25-15 MG/5ML syrup 5 mLs, Oral, 4  times daily PRN   rivaroxaban  (XARELTO ) 2.5 mg, Oral, 2 times daily, Restart on 12/04/2020   vitamin C with rose hips 500 mg, 2 times daily   Zinc Sulfate 66 MG TABS        Objective:   Physical Exam BP 122/80   Pulse 89   Temp 97.8 F (36.6 C) (Oral)   Resp 18   Ht 5' 1 (1.549 m)   SpO2 99%   BMI 26.45 kg/m  General:   Well developed, NAD, BMI noted. HEENT:  Normocephalic . Face symmetric, atraumatic Lungs:  CTA B Normal respiratory effort, no intercostal retractions, no accessory muscle use. Heart: RRR,  no murmur.  Abdomen: Lower abdomen slightly TTP. Lower extremities: Status post BKA on the right, left foot is covered. Skin: No rash on the lower abdomen, suprapubic area or groins. Neurologic:  alert & follow some commands, answer questions sometimes appropriately.  Recognizes me and her grandson, not oriented in place.SABRA Speech normal  Psych--   Behavior appropriate.  Not agitated. No anxious or depressed appearing.      Assessment    ASSESSMENT DM, Neuropathy, amputations due to Charcot feet. - metformin  intolerant  (lethargic) HTN  Hyperlipidemia CV: ---CAD cardiologist in [facility] --NSTEMI 09-2016. Outside hospital: Cath 2 vessel disease, CABG 2, LIMA to LAD and Diag   ---Chronic diastolic CHF ---PVD: Multiple procedures per Dr Dedra, ([place] ) ---May 2023: Cardiology rec continue Plavix  and Xarelto , stop aspirin . ---  IVC filter seen on CXR  09/23/2020 GI: GERD, IBS, colon polyps, PUD, abdominal pain post polypectomy syndrome naueas meds prn (antivert -zofran , gets nausea when car traveling) Osteoporosis  MSK,pain mngmt : --Reflex sympathetic dystrophy --Neuropathy --DJD --Spinal stenosis  --H/o Osteomyelitis  Foot --s/p amputation:  4th 5th L toes , R BKA  (01-2016, dx osteomyelitis) w/ phantom pain  -- sees Dr Harden prn She took oxycodone  after surgery and that did NOT seem to help better than hydrocodone . Intolerant to Cymbalta, Neurontin ;  Lyrica helped but caused edema. Recurrent UTIs: Dr Nieves  Venous insufficiency Dementia: MMSE 23 (January 2023) H/o  + PPD H/o hypothyroidism: TSHs stable H/o dyspnea  LUNG MASS -incidental per CT 2009,  PET scan 01-2008: likely  benign, CT 08-2009 no change, no further CTs per Dr Corrie - Had a CT in [place]  09-2016 spiculated mass, saw Dr Shelah,  CT  09/2017 stable   PLAN:  Since the last  visit: -Was admitted to the hospital with UTI and dehydration. -Also developed wound at the left foot for which she iss taking antibiotics through the wound clinic.  Rule out is exposed, they are recommending surgical removal of hardware. -- Was seen at this office 10/04/2024, palliative care was suggested. -- Labs: Creatinine when checked last month was slightly above baseline.  Has anemia with a normal iron in November.  Last TSH satisfactory - Family reports:Steady decline since October 2025 Failure to thrive: This is a 89 year old lady with multiple medical problems including DM, HTN CAD, R BKA, with an active wound at the left foot treated by the wound care center, improving  pressure ulcer in the buttock, recent UTI currently complaining of suprapubic pain with difficulty obtaining a urine sample because typically she defecates and urinate at the same time. Pt is steadily declining since October according to the family, poor p.o. intake, weight loss, worsening memory, she does have occasional red blood per rectum but the stools are normal in color, she is anticoagulated. This is a difficult situation. Plan: BMP CBC anemia panel I spoke with the son-in-law today, pt seems a good candidate for hospice vs palliative  care at this time, as recently discussed w/ them  by one of my colleagues. Planning to spoke with the pt's son tomorrow once labs are available to discuss  further steps. Will continue with Aricept . PDMP ok,  hydrocodone  refilled.  Encouraged minimal use. "

## 2024-11-10 NOTE — Patient Instructions (Addendum)
 Please read your instructions carefully.   GO TO THE LAB :  Get the blood work  Bring a urine sample when possible.  Go to the front desk for the checkout Please make an appointment for a checkup in 2 to 3 months   Start considering hospice or palliative care  ER if fever, chills, decrease mental status, increased confusion.  Pain medication refilled, minimize its use  Check the  blood pressure regularly Blood pressure goal:  between 110/65 and  130/80. If it is consistently higher or lower, let me know

## 2024-11-10 NOTE — Progress Notes (Signed)
 " Hematology and Oncology Follow Up Visit  IBETH FAHMY [id number] December 23, 1934 89 y.o. 11/10/2024   Principle Diagnosis:  Iron deficiency anemia  Mild thrombocytopenia  Mild Neutropenia   Current Therapy:        IV iron as indicated    Interim History:  Ms. Baade is here today with her grandson. She is having a noticeable progression with her dementia. She is pleasantly confused but not eating or drinking well.  Her grandson states that PCP plans to call and discuss involving palliative care which would be quite helpful.  She has been having pain in the pelvis when urinating and they have supplies to collect a urine specimen for further evaluation.  She did not stand for a weight today. She has progression in the wound on her left foot and is supposed to have imaging and see podiatry as soon as possible. They may have to remove a rod from that foot. She is wearing a protective plush boot.  She does have a pressure ulcer on her bottom and is using a donut cushion when sitting.  No fever, chills, n/v, cough, rash, dizziness, SOB, chest pain, palpitations or changes in bowel or bladder habits.  No falls or syncope reported.   ECOG Performance Status: 2 - Symptomatic, <50% confined to bed  Medications:  Allergies as of 11/10/2024       Reactions   Metformin  And Related Diarrhea, Nausea Only, Other (See Comments)   dizzy, tired, chills   Cymbalta [duloxetine Hcl] Swelling   Swelling in legs   Gabapentin Swelling   Swelling in legs   Silicone Hives, Itching, Dermatitis, Rash   Tramadol Swelling   Adhesive [tape] Rash   Cefazolin Hives      Ciprofloxacin  Other (See Comments)   body aches   Clorazepate Dipotassium Other (See Comments)   Unknown reaction   Dilaudid  [hydromorphone  Hcl] Itching    confused,   Doxycycline Rash   Enablex [darifenacin Hydrobromide Er] Other (See Comments)   Hypotension, near syncope   Levofloxacin Other (See Comments)   Causes wrist pain   Lyrica  [pregabalin] Swelling   Swelling in legs   Methadone Hcl Other (See Comments)   Reaction unknown   Morphine  And Codeine Other (See Comments)   Confusion, constipation.   Talwin [pentazocine] Other (See Comments)   climbing walls anxiety        Medication List        Accurate as of November 10, 2024  3:08 PM. If you have any questions, ask your nurse or doctor.          pramipexole  0.125 MG tablet Commonly known as: MIRAPEX  Take 1-2 tablets (0.125-0.25 mg total) by mouth at bedtime as needed. The timing of this medication is very important.   Alpha-Lipoic Acid 600 MG Caps Take 1,200 mg by mouth daily.   amitriptyline  25 MG tablet Commonly known as: ELAVIL  Take 1 tablet (25 mg total) by mouth at bedtime.   amoxicillin -clavulanate 875-125 MG tablet Commonly known as: AUGMENTIN  Take 1 tablet by mouth 2 (two) times daily.   atorvastatin  40 MG tablet Commonly known as: LIPITOR Take 40 mg by mouth daily.   BERBERINE CHLORIDE PO Take by mouth. 1 every morning, 2 at bedtime. OTC   calcium  carbonate 750 MG chewable tablet Commonly known as: TUMS EX Chew 4 tablets by mouth daily.   carvedilol  12.5 MG tablet Commonly known as: COREG  Take 12.5 mg by mouth 2 (two) times daily with a meal.  CINNAMON PO Take 1 tablet by mouth daily.   clopidogrel  75 MG tablet Commonly known as: PLAVIX  Take 1 tablet (75 mg total) by mouth at bedtime. Restart on 12/04/2020   Cranberry 200 MG Caps Take 2 capsules by mouth daily. *36 mg extract*   dicyclomine  10 MG capsule Commonly known as: BENTYL  Take 1 capsule (10 mg total) by mouth 2 (two) times daily as needed for spasms.   donepezil  10 MG tablet Commonly known as: ARICEPT  Take 1 tablet (10 mg total) by mouth at bedtime.   estradiol  0.01 % Crea vaginal cream Commonly known as: ESTRACE  Apply one gram of cream vaginally 2-3 x a week   famotidine  20 MG tablet Commonly known as: PEPCID  TAKE 1 TABLET(20 MG) BY MOUTH TWICE  DAILY   HYDROcodone -acetaminophen  10-325 MG tablet Commonly known as: NORCO Take 1 tablet by mouth every 6 (six) hours as needed.   levofloxacin 500 MG tablet Commonly known as: LEVAQUIN Take 500 mg by mouth daily.   naloxone  4 MG/0.1ML Liqd nasal spray kit Commonly known as: NARCAN  Place 1 spray into the nose once as needed for up to 1 dose.   nitroGLYCERIN  0.4 MG SL tablet Commonly known as: NITROSTAT  Place 1 tablet (0.4 mg total) under the tongue every 5 (five) minutes x 3 doses as needed for chest pain.   omeprazole  20 MG capsule Commonly known as: PRILOSEC Take 1 capsule (20 mg total) by mouth 2 (two) times daily as needed for up to 14 days.   ondansetron  4 MG tablet Commonly known as: ZOFRAN  Take 1 tablet (4 mg total) by mouth every 8 (eight) hours as needed for nausea or vomiting.   PROBIOTIC PEARLS PO Take 1 tablet by mouth daily.   promethazine -dextromethorphan 6.25-15 MG/5ML syrup Commonly known as: PROMETHAZINE -DM Take 5 mLs by mouth 4 (four) times daily as needed for cough (And nausea).   rivaroxaban  2.5 MG Tabs tablet Commonly known as: XARELTO  Take 1 tablet (2.5 mg total) by mouth 2 (two) times daily. Restart on 12/04/2020   Santyl  250 UNIT/GM ointment Generic drug: collagenase  Apply 1 Application topically daily.   Santyl  250 UNIT/GM ointment Generic drug: collagenase  Apply 1 Application topically daily.   vitamin C with rose hips 500 MG tablet Take 500 mg by mouth in the morning and at bedtime.   Voltaren  Arthritis Pain 1 % Gel Generic drug: diclofenac  Sodium Apply topically 4 (four) times daily.   Zinc Sulfate 66 MG Tabs        Allergies: Allergies[1]  Past Medical History, Surgical history, Social history, and Family History were reviewed and updated.  Review of Systems: All other 10 point review of systems is negative.   Physical Exam:  oral temperature is 97.8 F (36.6 C). Her blood pressure is 122/80 and her pulse is 89. Her  respiration is 18 and oxygen  saturation is 99%.   Wt Readings from Last 3 Encounters:  10/09/24 140 lb (63.5 kg)  09/13/24 138 lb 14.2 oz (63 kg)  08/28/24 162 lb 14.7 oz (73.9 kg)    Ocular: Sclerae unicteric, pupils equal, round and reactive to light Ear-nose-throat: Oropharynx clear, dentition fair Lymphatic: No cervical or supraclavicular adenopathy Lungs no rales or rhonchi, good excursion bilaterally Heart regular rate and rhythm, no murmur appreciated Abd soft, nontender, positive bowel sounds MSK no focal spinal tenderness, no joint edema Neuro: non-focal, well-oriented, appropriate affect Breasts: Deferred   Lab Results  Component Value Date   WBC 8.6 11/10/2024   HGB 8.9 (L)  11/10/2024   HCT 29.0 (L) 11/10/2024   MCV 101.8 (H) 11/10/2024   PLT 148 (L) 11/10/2024   Lab Results  Component Value Date   FERRITIN 18 09/01/2024   IRON 56 09/01/2024   TIBC 284 09/01/2024   UIBC 228 09/01/2024   IRONPCTSAT 20 09/01/2024   Lab Results  Component Value Date   RETICCTPCT 2.6 11/10/2024   RBC 2.87 (L) 11/10/2024   RBC 2.85 (L) 11/10/2024   No results found for: KPAFRELGTCHN, LAMBDASER, KAPLAMBRATIO No results found for: IGGSERUM, IGA, IGMSERUM No results found for: STEPHANY CARLOTA BENSON MARKEL EARLA JOANNIE DOC VICK, SPEI   Chemistry      Component Value Date/Time   NA 139 11/10/2024 1422   NA 140 09/03/2020 0000   K 3.9 11/10/2024 1422   CL 113 (H) 11/10/2024 1422   CO2 12 (L) 11/10/2024 1422   BUN 86 (H) 11/10/2024 1422   BUN 17 09/03/2020 0000   CREATININE 2.45 (H) 11/10/2024 1422   CREATININE 1.12 (H) 06/22/2023 1515   GLU 146 09/03/2020 0000      Component Value Date/Time   CALCIUM  8.9 11/10/2024 1422   ALKPHOS 40 11/10/2024 1422   AST 17 11/10/2024 1422   ALT 11 11/10/2024 1422   BILITOT <0.2 11/10/2024 1422       Impression and Plan: Ms. Masaki is a very pleasant 89 yo caucasian female with iron  deficiency anemia secondary to GI blood loss and mild thrombocytopenia.   CBC counts remain stable at this time.  Iron studies are pending. We will replace if needed.  We did discuss marinol  as a possible option to boost appetite. This has been on back order but grandson will call around and see if any local pharmacies have some.  Follow-up in another 2 months.   Lauraine Pepper, NP 1/19/20263:08 PM     [1]  Allergies Allergen Reactions   Metformin  And Related Diarrhea, Nausea Only and Other (See Comments)    dizzy, tired, chills   Cymbalta [Duloxetine Hcl] Swelling    Swelling in legs   Gabapentin Swelling    Swelling in legs   Silicone Hives, Itching, Dermatitis and Rash   Tramadol Swelling   Adhesive [Tape] Rash   Cefazolin Hives        Ciprofloxacin  Other (See Comments)    body aches    Clorazepate Dipotassium Other (See Comments)    Unknown reaction   Dilaudid  [Hydromorphone  Hcl] Itching     confused,   Doxycycline Rash   Enablex [Darifenacin Hydrobromide Er] Other (See Comments)    Hypotension, near syncope   Levofloxacin Other (See Comments)    Causes wrist pain   Lyrica [Pregabalin] Swelling    Swelling in legs   Methadone Hcl Other (See Comments)    Reaction unknown   Morphine  And Codeine Other (See Comments)    Confusion, constipation.    Talwin [Pentazocine] Other (See Comments)    climbing walls anxiety   "

## 2024-11-11 ENCOUNTER — Telehealth: Payer: Self-pay | Admitting: *Deleted

## 2024-11-11 ENCOUNTER — Ambulatory Visit: Payer: Self-pay | Admitting: Internal Medicine

## 2024-11-11 ENCOUNTER — Encounter: Payer: Self-pay | Admitting: Internal Medicine

## 2024-11-11 LAB — IBC + FERRITIN
Ferritin: 224.4 ng/mL (ref 10.0–291.0)
Iron: 64 ug/dL (ref 42–145)
Saturation Ratios: 31.3 % (ref 20.0–50.0)
TIBC: 204.4 ug/dL — ABNORMAL LOW (ref 250.0–450.0)
Transferrin: 146 mg/dL — ABNORMAL LOW (ref 212.0–360.0)

## 2024-11-11 LAB — BASIC METABOLIC PANEL WITH GFR
BUN: 85 mg/dL (ref 6–23)
CO2: 13 meq/L — ABNORMAL LOW (ref 19–32)
Calcium: 8.5 mg/dL (ref 8.4–10.5)
Chloride: 114 meq/L — ABNORMAL HIGH (ref 96–112)
Creatinine, Ser: 2.51 mg/dL — ABNORMAL HIGH (ref 0.40–1.20)
GFR: 16.8 mL/min — ABNORMAL LOW
Glucose, Bld: 110 mg/dL — ABNORMAL HIGH (ref 70–99)
Potassium: 3.7 meq/L (ref 3.5–5.1)
Sodium: 138 meq/L (ref 135–145)

## 2024-11-11 LAB — CBC WITH DIFFERENTIAL/PLATELET
Basophils Absolute: 0.1 K/uL (ref 0.0–0.1)
Basophils Relative: 1.1 % (ref 0.0–3.0)
Eosinophils Absolute: 0.1 K/uL (ref 0.0–0.7)
Eosinophils Relative: 1.3 % (ref 0.0–5.0)
HCT: 27.4 % — ABNORMAL LOW (ref 36.0–46.0)
Hemoglobin: 8.9 g/dL — ABNORMAL LOW (ref 12.0–15.0)
Lymphocytes Relative: 12.6 % (ref 12.0–46.0)
Lymphs Abs: 1 K/uL (ref 0.7–4.0)
MCHC: 32.5 g/dL (ref 30.0–36.0)
MCV: 98.8 fl (ref 78.0–100.0)
Monocytes Absolute: 0.4 K/uL (ref 0.1–1.0)
Monocytes Relative: 5 % (ref 3.0–12.0)
Neutro Abs: 6.2 K/uL (ref 1.4–7.7)
Neutrophils Relative %: 80 % — ABNORMAL HIGH (ref 43.0–77.0)
Platelets: 158 K/uL (ref 150.0–400.0)
RBC: 2.77 Mil/uL — ABNORMAL LOW (ref 3.87–5.11)
RDW: 18.1 % — ABNORMAL HIGH (ref 11.5–15.5)
WBC: 7.8 K/uL (ref 4.0–10.5)

## 2024-11-11 NOTE — Telephone Encounter (Signed)
 Labs are in epic   CRITICAL VALUE STICKER  CRITICAL VALUE: BUN 85   MESSENGER (representative from lab): Ernst

## 2024-11-11 NOTE — Telephone Encounter (Signed)
Provider verbally informed.

## 2024-11-11 NOTE — Telephone Encounter (Signed)
 See office visit note, result discussed with the patient's son

## 2024-11-11 NOTE — Assessment & Plan Note (Signed)
 Since the last visit: -Was admitted to the hospital with UTI and dehydration. -Also developed wound at the left foot for which she iss taking antibiotics through the wound clinic.  Rod is exposed, they are recommending surgical removal of hardware. -- Was seen at this office 10/04/2024, palliative care was suggested. -- Labs: Creatinine when checked last month was slightly above baseline.  Has anemia with a normal iron in November.  Last TSH satisfactory - Family reports:Steady decline since October 2025 Failure to thrive: This is a 89 year old lady with multiple medical problems including DM, HTN CAD, R BKA, with an active wound at the left foot treated by the wound care center, improving  pressure ulcer in the buttock, recent UTI currently complaining of suprapubic pain with difficulty obtaining a urine sample because typically she defecates and urinate at the same time. Pt is steadily declining since October according to the family, poor p.o. intake, weight loss, worsening memory, she does have occasional red blood per rectum but the stools are normal in color, she is anticoagulated. This is a difficult situation. Plan: BMP CBC anemia panel I spoke with the g-son today, in my opinion she is a good candidate for hospice vs palliative  care at this time, as recently discussed w/ them  by one of my colleagues. Planning to spoke with the pt's son tomorrow once labs are available to discuss  further steps. Will continue with Aricept . PDMP ok,  hydrocodone  refilled.  Encouraged minimal use. Addendum: CKD: GFR 19.  Elevated BUN, hemoglobin stable with normal ferritin.  Anemia likely due to CKD. Spoke with the patient's son, mental decline could be related to elevated BUN and CKD, under normal circumstances I would either send her for urgent nephrology consultation or even the hospital for treatment.  He will see about getting a nephrologist in [place]  where she is now and he does not think is  a good idea to take her to the hospital. Also recommended: Push fluids, avoid NSAIDs, decrease Levaquin to 250 mg every 48 hours Will place palliative referral.

## 2024-11-12 ENCOUNTER — Telehealth: Payer: Self-pay

## 2024-11-12 ENCOUNTER — Other Ambulatory Visit: Payer: Self-pay | Admitting: Family

## 2024-11-12 DIAGNOSIS — D509 Iron deficiency anemia, unspecified: Secondary | ICD-10-CM

## 2024-11-12 DIAGNOSIS — R630 Anorexia: Secondary | ICD-10-CM

## 2024-11-12 DIAGNOSIS — D696 Thrombocytopenia, unspecified: Secondary | ICD-10-CM

## 2024-11-12 DIAGNOSIS — D649 Anemia, unspecified: Secondary | ICD-10-CM

## 2024-11-12 DIAGNOSIS — R634 Abnormal weight loss: Secondary | ICD-10-CM

## 2024-11-12 MED ORDER — DRONABINOL 2.5 MG PO CAPS: 2.5000 mg | ORAL_CAPSULE | Freq: Two times a day (BID) | ORAL | 0 refills | Status: AC

## 2024-11-12 NOTE — Telephone Encounter (Signed)
 Pt's son, Reyes called in stating that he found a pharmacy that [id number] the Marinol . He is requesting this to be sent to the [facility] in [email or web address]

## 2024-11-20 ENCOUNTER — Encounter: Payer: Self-pay | Admitting: Family

## 2025-01-09 ENCOUNTER — Ambulatory Visit: Admitting: Internal Medicine

## 2025-01-12 ENCOUNTER — Inpatient Hospital Stay: Admitting: Family

## 2025-01-12 ENCOUNTER — Inpatient Hospital Stay

## 2025-07-03 ENCOUNTER — Ambulatory Visit
